# Patient Record
Sex: Female | Born: 1940
Health system: Southern US, Community
[De-identification: ages and names within clinical notes are randomized; demographics above are authoritative.]

## PROBLEM LIST (undated history)

## (undated) DIAGNOSIS — E039 Hypothyroidism, unspecified: Secondary | ICD-10-CM

## (undated) DIAGNOSIS — R061 Stridor: Secondary | ICD-10-CM

## (undated) DIAGNOSIS — S83519A Sprain of anterior cruciate ligament of unspecified knee, initial encounter: Secondary | ICD-10-CM

## (undated) DIAGNOSIS — Z87442 Personal history of urinary calculi: Secondary | ICD-10-CM

## (undated) DIAGNOSIS — M810 Age-related osteoporosis without current pathological fracture: Secondary | ICD-10-CM

## (undated) DIAGNOSIS — N938 Other specified abnormal uterine and vaginal bleeding: Secondary | ICD-10-CM

## (undated) DIAGNOSIS — E538 Deficiency of other specified B group vitamins: Secondary | ICD-10-CM

## (undated) DIAGNOSIS — I7121 Aneurysm of the ascending aorta, without rupture: Secondary | ICD-10-CM

## (undated) DIAGNOSIS — D369 Benign neoplasm, unspecified site: Secondary | ICD-10-CM

## (undated) DIAGNOSIS — M797 Fibromyalgia: Secondary | ICD-10-CM

## (undated) DIAGNOSIS — Z9289 Personal history of other medical treatment: Secondary | ICD-10-CM

## (undated) DIAGNOSIS — K219 Gastro-esophageal reflux disease without esophagitis: Secondary | ICD-10-CM

## (undated) DIAGNOSIS — F419 Anxiety disorder, unspecified: Secondary | ICD-10-CM

## (undated) DIAGNOSIS — M1990 Unspecified osteoarthritis, unspecified site: Secondary | ICD-10-CM

## (undated) DIAGNOSIS — F329 Major depressive disorder, single episode, unspecified: Secondary | ICD-10-CM

## (undated) DIAGNOSIS — I712 Thoracic aortic aneurysm, without rupture: Secondary | ICD-10-CM

## (undated) DIAGNOSIS — Z930 Tracheostomy status: Secondary | ICD-10-CM

## (undated) DIAGNOSIS — E041 Nontoxic single thyroid nodule: Secondary | ICD-10-CM

## (undated) DIAGNOSIS — J386 Stenosis of larynx: Secondary | ICD-10-CM

## (undated) DIAGNOSIS — Z8781 Personal history of (healed) traumatic fracture: Secondary | ICD-10-CM

## (undated) DIAGNOSIS — H409 Unspecified glaucoma: Secondary | ICD-10-CM

## (undated) DIAGNOSIS — J42 Unspecified chronic bronchitis: Secondary | ICD-10-CM

## (undated) DIAGNOSIS — J45909 Unspecified asthma, uncomplicated: Secondary | ICD-10-CM

## (undated) DIAGNOSIS — R251 Tremor, unspecified: Secondary | ICD-10-CM

## (undated) DIAGNOSIS — A048 Other specified bacterial intestinal infections: Secondary | ICD-10-CM

## (undated) DIAGNOSIS — K449 Diaphragmatic hernia without obstruction or gangrene: Secondary | ICD-10-CM

## (undated) DIAGNOSIS — M8580 Other specified disorders of bone density and structure, unspecified site: Secondary | ICD-10-CM

## (undated) DIAGNOSIS — F32A Depression, unspecified: Secondary | ICD-10-CM

## (undated) DIAGNOSIS — I10 Essential (primary) hypertension: Secondary | ICD-10-CM

## (undated) DIAGNOSIS — E785 Hyperlipidemia, unspecified: Secondary | ICD-10-CM

## (undated) DIAGNOSIS — C73 Malignant neoplasm of thyroid gland: Secondary | ICD-10-CM

## (undated) HISTORY — PX: JOINT REPLACEMENT: SHX530

## (undated) HISTORY — DX: Other specified bacterial intestinal infections: A04.8

## (undated) HISTORY — DX: Other specified disorders of bone density and structure, unspecified site: M85.80

## (undated) HISTORY — PX: EXCISIONAL HEMORRHOIDECTOMY: SHX1541

## (undated) HISTORY — PX: FRACTURE SURGERY: SHX138

## (undated) HISTORY — DX: Diaphragmatic hernia without obstruction or gangrene: K44.9

## (undated) HISTORY — DX: Sprain of anterior cruciate ligament of unspecified knee, initial encounter: S83.519A

## (undated) HISTORY — DX: Essential (primary) hypertension: I10

## (undated) HISTORY — DX: Unspecified osteoarthritis, unspecified site: M19.90

## (undated) HISTORY — PX: CARDIAC VALVE REPLACEMENT: SHX585

## (undated) HISTORY — PX: CATARACT EXTRACTION, BILATERAL: SHX1313

## (undated) HISTORY — DX: Benign neoplasm, unspecified site: D36.9

## (undated) HISTORY — DX: Age-related osteoporosis without current pathological fracture: M81.0

## (undated) HISTORY — PX: APPENDECTOMY: SHX54

## (undated) HISTORY — DX: Other specified abnormal uterine and vaginal bleeding: N93.8

## (undated) HISTORY — DX: Hyperlipidemia, unspecified: E78.5

## (undated) HISTORY — DX: Fibromyalgia: M79.7

## (undated) HISTORY — DX: Major depressive disorder, single episode, unspecified: F32.9

## (undated) HISTORY — PX: HERNIA REPAIR: SHX51

## (undated) HISTORY — DX: Depression, unspecified: F32.A

## (undated) HISTORY — DX: Deficiency of other specified B group vitamins: E53.8

## (undated) HISTORY — DX: Malignant neoplasm of thyroid gland: C73

## (undated) HISTORY — DX: Unspecified glaucoma: H40.9

## (undated) HISTORY — PX: EYE SURGERY: SHX253

---

## 1977-06-27 HISTORY — PX: CHOLECYSTECTOMY OPEN: SUR202

## 1995-03-28 DIAGNOSIS — N938 Other specified abnormal uterine and vaginal bleeding: Secondary | ICD-10-CM

## 1995-03-28 HISTORY — DX: Other specified abnormal uterine and vaginal bleeding: N93.8

## 1995-03-31 HISTORY — PX: DILATION AND CURETTAGE OF UTERUS: SHX78

## 1997-07-28 HISTORY — PX: VENTRAL HERNIA REPAIR: SHX424

## 1998-02-20 ENCOUNTER — Other Ambulatory Visit: Admission: RE | Admit: 1998-02-20 | Discharge: 1998-02-20 | Payer: Self-pay | Admitting: Obstetrics and Gynecology

## 1998-08-14 ENCOUNTER — Other Ambulatory Visit: Admission: RE | Admit: 1998-08-14 | Discharge: 1998-08-14 | Payer: Self-pay | Admitting: Obstetrics and Gynecology

## 1999-01-26 ENCOUNTER — Other Ambulatory Visit: Admission: RE | Admit: 1999-01-26 | Discharge: 1999-01-26 | Payer: Self-pay | Admitting: Obstetrics and Gynecology

## 1999-05-13 ENCOUNTER — Encounter: Payer: Self-pay | Admitting: Family Medicine

## 1999-05-13 ENCOUNTER — Ambulatory Visit (HOSPITAL_COMMUNITY): Admission: RE | Admit: 1999-05-13 | Discharge: 1999-05-13 | Payer: Self-pay | Admitting: Family Medicine

## 1999-08-26 HISTORY — PX: BUNIONECTOMY: SHX129

## 1999-10-14 ENCOUNTER — Encounter: Admission: RE | Admit: 1999-10-14 | Discharge: 1999-10-14 | Payer: Self-pay | Admitting: Obstetrics and Gynecology

## 1999-10-14 ENCOUNTER — Encounter: Payer: Self-pay | Admitting: Obstetrics and Gynecology

## 2000-01-28 ENCOUNTER — Other Ambulatory Visit: Admission: RE | Admit: 2000-01-28 | Discharge: 2000-01-28 | Payer: Self-pay | Admitting: Obstetrics and Gynecology

## 2000-08-09 ENCOUNTER — Encounter: Admission: RE | Admit: 2000-08-09 | Discharge: 2000-08-09 | Payer: Self-pay | Admitting: Gastroenterology

## 2000-08-09 ENCOUNTER — Encounter: Payer: Self-pay | Admitting: Gastroenterology

## 2000-10-25 ENCOUNTER — Encounter: Admission: RE | Admit: 2000-10-25 | Discharge: 2000-10-25 | Payer: Self-pay | Admitting: Family Medicine

## 2000-10-25 ENCOUNTER — Encounter: Payer: Self-pay | Admitting: Family Medicine

## 2001-02-05 ENCOUNTER — Other Ambulatory Visit: Admission: RE | Admit: 2001-02-05 | Discharge: 2001-02-05 | Payer: Self-pay | Admitting: Obstetrics and Gynecology

## 2001-10-29 ENCOUNTER — Encounter: Payer: Self-pay | Admitting: Family Medicine

## 2001-10-29 ENCOUNTER — Encounter: Admission: RE | Admit: 2001-10-29 | Discharge: 2001-10-29 | Payer: Self-pay | Admitting: Family Medicine

## 2002-03-12 ENCOUNTER — Ambulatory Visit (HOSPITAL_COMMUNITY): Admission: RE | Admit: 2002-03-12 | Discharge: 2002-03-12 | Payer: Self-pay | Admitting: Gastroenterology

## 2002-03-12 ENCOUNTER — Encounter (INDEPENDENT_AMBULATORY_CARE_PROVIDER_SITE_OTHER): Payer: Self-pay | Admitting: *Deleted

## 2002-10-29 ENCOUNTER — Encounter: Admission: RE | Admit: 2002-10-29 | Discharge: 2002-10-29 | Payer: Self-pay | Admitting: Gastroenterology

## 2002-10-29 ENCOUNTER — Encounter: Payer: Self-pay | Admitting: Gastroenterology

## 2002-10-31 ENCOUNTER — Encounter: Payer: Self-pay | Admitting: Obstetrics and Gynecology

## 2002-10-31 ENCOUNTER — Encounter: Admission: RE | Admit: 2002-10-31 | Discharge: 2002-10-31 | Payer: Self-pay | Admitting: Obstetrics and Gynecology

## 2003-11-03 ENCOUNTER — Encounter: Admission: RE | Admit: 2003-11-03 | Discharge: 2003-11-03 | Payer: Self-pay | Admitting: Family Medicine

## 2004-12-07 ENCOUNTER — Encounter: Admission: RE | Admit: 2004-12-07 | Discharge: 2004-12-07 | Payer: Self-pay | Admitting: Obstetrics and Gynecology

## 2005-12-09 ENCOUNTER — Encounter: Admission: RE | Admit: 2005-12-09 | Discharge: 2005-12-09 | Payer: Self-pay | Admitting: Family Medicine

## 2006-07-03 ENCOUNTER — Ambulatory Visit (HOSPITAL_COMMUNITY): Admission: RE | Admit: 2006-07-03 | Discharge: 2006-07-03 | Payer: Self-pay | Admitting: Ophthalmology

## 2006-12-12 ENCOUNTER — Encounter: Admission: RE | Admit: 2006-12-12 | Discharge: 2006-12-12 | Payer: Self-pay | Admitting: Obstetrics and Gynecology

## 2007-03-12 ENCOUNTER — Ambulatory Visit (HOSPITAL_COMMUNITY): Admission: RE | Admit: 2007-03-12 | Discharge: 2007-03-12 | Payer: Self-pay | Admitting: Ophthalmology

## 2007-05-28 ENCOUNTER — Ambulatory Visit (HOSPITAL_COMMUNITY): Admission: RE | Admit: 2007-05-28 | Discharge: 2007-05-28 | Payer: Self-pay | Admitting: Ophthalmology

## 2007-05-28 LAB — HM COLONOSCOPY

## 2007-10-15 ENCOUNTER — Encounter (INDEPENDENT_AMBULATORY_CARE_PROVIDER_SITE_OTHER): Payer: Self-pay | Admitting: Ophthalmology

## 2007-10-15 ENCOUNTER — Ambulatory Visit (HOSPITAL_COMMUNITY): Admission: RE | Admit: 2007-10-15 | Discharge: 2007-10-15 | Payer: Self-pay | Admitting: Ophthalmology

## 2007-12-14 ENCOUNTER — Encounter: Admission: RE | Admit: 2007-12-14 | Discharge: 2007-12-14 | Payer: Self-pay | Admitting: Family Medicine

## 2008-03-14 ENCOUNTER — Encounter (INDEPENDENT_AMBULATORY_CARE_PROVIDER_SITE_OTHER): Payer: Self-pay | Admitting: Gastroenterology

## 2008-03-14 ENCOUNTER — Ambulatory Visit (HOSPITAL_COMMUNITY): Admission: RE | Admit: 2008-03-14 | Discharge: 2008-03-14 | Payer: Self-pay | Admitting: Gastroenterology

## 2008-10-13 ENCOUNTER — Ambulatory Visit (HOSPITAL_COMMUNITY): Admission: RE | Admit: 2008-10-13 | Discharge: 2008-10-13 | Payer: Self-pay | Admitting: Ophthalmology

## 2008-12-16 ENCOUNTER — Encounter: Admission: RE | Admit: 2008-12-16 | Discharge: 2008-12-16 | Payer: Self-pay | Admitting: Family Medicine

## 2009-04-20 ENCOUNTER — Encounter: Admission: RE | Admit: 2009-04-20 | Discharge: 2009-06-25 | Payer: Self-pay | Admitting: Orthopedic Surgery

## 2009-06-17 ENCOUNTER — Encounter: Admission: RE | Admit: 2009-06-17 | Discharge: 2009-06-17 | Payer: Self-pay | Admitting: Family Medicine

## 2009-08-25 HISTORY — PX: TOTAL KNEE ARTHROPLASTY: SHX125

## 2009-09-23 ENCOUNTER — Inpatient Hospital Stay (HOSPITAL_COMMUNITY): Admission: RE | Admit: 2009-09-23 | Discharge: 2009-09-26 | Payer: Self-pay | Admitting: Orthopedic Surgery

## 2009-10-12 ENCOUNTER — Encounter: Admission: RE | Admit: 2009-10-12 | Discharge: 2010-01-11 | Payer: Self-pay | Admitting: Orthopedic Surgery

## 2010-01-12 ENCOUNTER — Encounter: Admission: RE | Admit: 2010-01-12 | Discharge: 2010-03-25 | Payer: Self-pay | Admitting: Orthopedic Surgery

## 2010-01-13 ENCOUNTER — Encounter: Payer: Self-pay | Admitting: Cardiology

## 2010-01-27 ENCOUNTER — Encounter: Admission: RE | Admit: 2010-01-27 | Discharge: 2010-01-27 | Payer: Self-pay | Admitting: Obstetrics and Gynecology

## 2010-02-23 DIAGNOSIS — K219 Gastro-esophageal reflux disease without esophagitis: Secondary | ICD-10-CM | POA: Insufficient documentation

## 2010-02-23 DIAGNOSIS — I059 Rheumatic mitral valve disease, unspecified: Secondary | ICD-10-CM | POA: Insufficient documentation

## 2010-02-23 DIAGNOSIS — I10 Essential (primary) hypertension: Secondary | ICD-10-CM | POA: Insufficient documentation

## 2010-02-23 DIAGNOSIS — M1990 Unspecified osteoarthritis, unspecified site: Secondary | ICD-10-CM | POA: Insufficient documentation

## 2010-02-23 DIAGNOSIS — I00 Rheumatic fever without heart involvement: Secondary | ICD-10-CM | POA: Insufficient documentation

## 2010-02-23 DIAGNOSIS — E785 Hyperlipidemia, unspecified: Secondary | ICD-10-CM | POA: Insufficient documentation

## 2010-02-23 DIAGNOSIS — H409 Unspecified glaucoma: Secondary | ICD-10-CM | POA: Insufficient documentation

## 2010-02-23 DIAGNOSIS — F339 Major depressive disorder, recurrent, unspecified: Secondary | ICD-10-CM | POA: Insufficient documentation

## 2010-02-23 DIAGNOSIS — H269 Unspecified cataract: Secondary | ICD-10-CM | POA: Insufficient documentation

## 2010-02-23 DIAGNOSIS — M797 Fibromyalgia: Secondary | ICD-10-CM | POA: Insufficient documentation

## 2010-02-23 DIAGNOSIS — F329 Major depressive disorder, single episode, unspecified: Secondary | ICD-10-CM

## 2010-02-26 ENCOUNTER — Ambulatory Visit: Payer: Self-pay | Admitting: Cardiology

## 2010-02-26 DIAGNOSIS — R072 Precordial pain: Secondary | ICD-10-CM | POA: Insufficient documentation

## 2010-03-09 ENCOUNTER — Telehealth (INDEPENDENT_AMBULATORY_CARE_PROVIDER_SITE_OTHER): Payer: Self-pay | Admitting: *Deleted

## 2010-03-10 ENCOUNTER — Encounter: Payer: Self-pay | Admitting: Cardiology

## 2010-03-10 ENCOUNTER — Ambulatory Visit: Payer: Self-pay

## 2010-03-10 ENCOUNTER — Ambulatory Visit: Payer: Self-pay | Admitting: Cardiology

## 2010-03-10 ENCOUNTER — Encounter (HOSPITAL_COMMUNITY)
Admission: RE | Admit: 2010-03-10 | Discharge: 2010-04-28 | Payer: Self-pay | Source: Home / Self Care | Admitting: Cardiology

## 2010-07-18 ENCOUNTER — Encounter: Payer: Self-pay | Admitting: Family Medicine

## 2010-07-28 LAB — FECAL OCCULT BLOOD, GUAIAC: Fecal Occult Blood: NEGATIVE

## 2010-07-29 NOTE — Progress Notes (Signed)
Summary: nuc pre procedure  Phone Note Outgoing Call Call back at Home Phone [phone]   Call placed by: Cathlyn Parsons RN,  March 09, 2010 4:17 PM Call placed to: Patient Reason for Call: Confirm/change Appt Summary of Call: Left message with information on Myoview Information Sheet (see scanned document for details).      Nuclear Med Background Indications for Stress Test: Evaluation for Ischemia   History: MVP   Symptoms: Chest Pain, Chest Tightness, Fatigue    Nuclear Pre-Procedure Cardiac Risk Factors: Family History - CAD, Hypertension, Lipids, RBBB Height (in): 65

## 2010-07-29 NOTE — Assessment & Plan Note (Signed)
Summary: np6/ chest discomfort. pt has bcbs./ gd   Visit Type:  Initial Consult Primary Provider:  Dr. Vernon Prey  CC:  chest pain.  History of Present Illness: The patient presents for evaluation of chest discomfort and arm discomfort. She says this has been going on for a couple of months. It happens when she is stressed or when she is in her garden. She says her arms feel tired. She may have some midsternal tightness. She said she stops what she is doing and she takes a deep breath and her symptoms will go away. She's had more fatigue than she usually does. She's never had symptoms like this before. She denies any resting shortness of breath, PND or orthopnea. She denies any palpitations, presyncope or syncope. She's had no prior cardiovascular testing. There is a vague history of rheumatic heart disease as a child with mitral valve prolapse diagnosed as an adult but she doesn't remember ever having an echocardiogram. She does have a chronic right bundle branch block.  Current Medications (verified): 1)  Simvastatin 80 Mg Tabs (Simvastatin) .Marland Kitchen.. 1 By Mouth Daily 2)  Celebrex 200 Mg Caps (Celecoxib) .Marland Kitchen.. 1 By Mouth Daily 3)  Atenolol 50 Mg Tabs (Atenolol) .Marland Kitchen.. 1 By Mouth Daily 4)  Venlafaxine Hcl 75 Mg Tabs (Venlafaxine Hcl) .Marland Kitchen.. 1 By Mouth Daily 5)  Ambien 10 Mg Tabs (Zolpidem Tartrate) .Marland Kitchen.. 1 By Mouth At Bedtime 6)  Tramadol Hcl 50 Mg Tabs (Tramadol Hcl) .... As Needed 7)  Clorazepate Dipotassium 7.5 Mg Tabs (Clorazepate Dipotassium) .Marland Kitchen.. 1 By Mouth As Needed 8)  Gabapentin 300 Mg Caps (Gabapentin) .Marland Kitchen.. 1 By Mouth Daily 9)  Aspirin 325 Mg  Tabs (Aspirin) .Marland Kitchen.. 1 By Mouth Daily 10)  Vitamin D3 2000 Unit Tabs (Cholecalciferol) .Marland Kitchen.. 1 By Mouth Daily 11)  Prilosec 20 Mg Cpdr (Omeprazole) .Marland Kitchen.. 1 Po Daily 12)  Multivitamins   Tabs (Multiple Vitamin) .Marland Kitchen.. 1 By Mouth Daily 13)  Fish Oil   Oil (Fish Oil) .Marland Kitchen.. 1 By Mouth Daily  Allergies (verified): 1)  ! * Mobic  Past History:  Past Medical  History:  1. Osteoarthritis of the right knee.   2. Depression.   3. Glaucoma.   4. Cataracts.   5. Dentures.   6. Hypertension. x years  7. Rheumatic fever.   8. Mitral valve prolapse.   9. Hyperlipidemia. x years  10.Hiatal hernia.   11.Gastroesophageal reflux disease.   12.Hemorrhoids.   13.Cholelithiasis.   14.Osteoporosis.   15.Fibromyalgia.   16.Osteoarthritis.   17.History of scarlet fever as a child.   18.History of measles as a child.   19.History of mumps as a child.   Past Surgical History: Reviewed history from 02/23/2010 and no changes required.  1. Cholecystectomy.   2. Hernia repair.   3. Hammertoe repair of the foot.   4. Hemorrhoidectomy.      Family History: Father died at the age of 46 of heart attack.  Mother died at age 43 of pancreatic cancer.      Social History: The patient is married.  She is a housewife.  The   patient denies any past or present use of tobacco products or alcohol.   The patient states that she does plan to go home after her hospital   stay.  Her husband is lined up to be her caregiver and they would like   to  Home Health to come out to do her physical therapy after   surgery.   Review of Systems  Positive for dysphasia, occasional headaches, tinnitus, constipation, reflux, fibromyalgia. Otherwise as stated in the history of present illness negative for all other systems.  Vital Signs:  Patient profile:   70 year old female Height:      65 inches Weight:      196 pounds BMI:     32.73 Pulse rate:   64 / minute Resp:     16 per minute BP sitting:   117 / 76  (right arm)  Vitals Entered By: Marrion Coy, CNA (February 26, 2010 3:57 PM)  Physical Exam  General:  Well developed, well nourished, in no acute distress. Head:  normocephalic and atraumatic Eyes:  PERRLA/EOM intact; conjunctiva and lids normal. Neck:  Neck supple, no JVD. No masses, thyromegaly or abnormal cervical nodes. Chest Wall:  no  deformities or breast masses noted Lungs:  Clear bilaterally to auscultation and percussion. Abdomen:  Bowel sounds positive; abdomen soft and non-tender without masses, organomegaly, or hernias noted. No hepatosplenomegaly. Msk:  Back normal, normal gait. Muscle strength and tone normal. Extremities:  No clubbing or cyanosis. Neurologic:  Alert and oriented x 3. Skin:  Intact without lesions or rashes. Cervical Nodes:  no significant adenopathy Inguinal Nodes:  no significant adenopathy Psych:  Normal affect.   Detailed Cardiovascular Exam  Neck    Carotids: Carotids full and equal bilaterally without bruits.      Neck Veins: Normal, no JVD.    Heart    Inspection: no deformities or lifts noted.      Palpation: normal PMI with no thrills palpable.      Auscultation: regular rate and rhythm, S1, S2 without murmurs, rubs, gallops, or clicks.    Vascular    Abdominal Aorta: no palpable masses, pulsations, or audible bruits.      Femoral Pulses: normal femoral pulses bilaterally.      Pedal Pulses: normal pedal pulses bilaterally.      Radial Pulses: normal radial pulses bilaterally.      Peripheral Circulation: no clubbing, cyanosis, or edema noted with normal capillary refill.     EKG  Procedure date:  01/13/2010  Findings:      Sinus rhythm, rate 57, axis within normal limits, right bundle branch block  Impression & Recommendations:  Problem # 1:  CHEST PAIN, PRECORDIAL (ICD-786.51) The patient's chest pain has some typical and some atypical features. She does have significant cardiovascular risk factors. I think the pretest probability of obstructive coronary disease is at least moderately high. Given this stress perfusion imaging is indicated. She would likely be able to walk on a treadmill but if not she could be converted to pharmacologic perfusion imaging. Orders: Nuclear Stress Test (Nuc Stress Test)  Problem # 2:  HYPERTENSION (ICD-401.9) Her blood pressure is  controlled. She will continue the meds as listed.  Problem # 3:  HYPERLIPIDEMIA (ICD-272.4) She has been on simvastatin 80 mg for years. She's had no symptoms related to this although there is a small possibility that her arm discomfort could be related. According the FDA guidelines she does not need to stop the 80 mg.  Problem # 4:  MITRAL VALVE PROLAPSE (ICD-424.0) I do not appreciate a mitral valve prolapse click. At this point I would not suggest further cardiovascular imaging but will consider this in the future.  Patient Instructions: 1)  Your physician recommends that you schedule a follow-up appointment as needed 2)  Your physician recommends that you continue on your current medications as directed. Please refer to  the Current Medication list given to you today. 3)  Your physician has requested that you have an exercise stress myoview.  For further information please visit [email or web address]  Please follow instruction sheet, as given.

## 2010-07-29 NOTE — Assessment & Plan Note (Signed)
Summary: Cardiology Nuclear Testing  Nuclear Med Background Indications for Stress Test: Evaluation for Ischemia   History: MVP   Symptoms: Chest Pain, Chest Tightness, Fatigue    Nuclear Pre-Procedure Cardiac Risk Factors: Family History - CAD, Hypertension, Lipids, RBBB Caffeine/Decaff Intake: none NPO After: 7:00 AM Lungs: clear IV 0.9% NS with Angio Cath: 22g     IV Site: R Antecubital IV Started by: Bonnita Levan, RN Chest Size (in) 40     Cup Size D     Height (in): 65 Weight (lb): 194 BMI: 32.40  Nuclear Med Study 1 or 2 day study:  1 day     Stress Test Type:  Stress Reading MD:  Willa Rough, MD     Referring MD:  Shela Commons.Hochrein Resting Radionuclide:  Technetium 58m Tetrofosmin     Resting Radionuclide Dose:  10 mCi  Stress Radionuclide:  Technetium 14m Tetrofosmin     Stress Radionuclide Dose:  33 mCi   Stress Protocol Exercise Time (min):  5:15 min     Max HR:  153 bpm     Predicted Max HR:  153 bpm  Max Systolic BP: 205 mm Hg     Percent Max HR:  100 %     METS: 6.80 Rate Pressure Product:  16109    Stress Test Technologist:  Milana Na, EMT-P     Nuclear Technologist:  Domenic Polite, CNMT  Rest Procedure  Myocardial perfusion imaging was performed at rest 45 minutes following the intravenous administration of Technetium 76m Tetrofosmin.  Stress Procedure  The patient exercised for 5:00. The patient stopped due to fatigue and denied any chest pain.  There were no significant ST-T wave changes.  Technetium 35m Tetrofosmin was injected at peak exercise and myocardial perfusion imaging was performed after a brief delay.  QPS Raw Data Images:  Patient motion noted; appropriate software correction applied. Stress Images:  Anterior breast attenuation Rest Images:  Same as stress Subtraction (SDS):  No evidence of ischemia. Transient Ischemic Dilatation:  1.18  (Normal <1.22)  Lung/Heart Ratio:  .28  (Normal <0.45)  Quantitative Gated Spect Images QGS EDV:   97 ml QGS ESV:  27 ml QGS EF:  72 % QGS cine images:  normal motion  Findings Normal nuclear study      Overall Impression  Exercise Capacity: Fair exercise capacity. BP Response: Normal blood pressure response. Clinical Symptoms: fatigue ECG Impression: No significant ST segment change suggestive of ischemia. Overall Impression: Normal stress nuclear study.  Appended Document: Cardiology Nuclear Testing No evidence of ischemia or infarct.  Appended Document: Cardiology Nuclear Testing pt aware of results

## 2010-09-15 LAB — HEMOGLOBIN AND HEMATOCRIT, BLOOD
HCT: 25.2 % — ABNORMAL LOW (ref 36.0–46.0)
Hemoglobin: 8.7 g/dL — ABNORMAL LOW (ref 12.0–15.0)
Hemoglobin: 9.7 g/dL — ABNORMAL LOW (ref 12.0–15.0)

## 2010-09-20 LAB — PROTIME-INR
INR: 1.03 (ref 0.00–1.49)
Prothrombin Time: 13.4 seconds (ref 11.6–15.2)

## 2010-09-20 LAB — URINALYSIS, ROUTINE W REFLEX MICROSCOPIC
Bilirubin Urine: NEGATIVE
Hgb urine dipstick: NEGATIVE
Nitrite: NEGATIVE
Protein, ur: NEGATIVE mg/dL
Specific Gravity, Urine: 1.004 — ABNORMAL LOW (ref 1.005–1.030)

## 2010-09-20 LAB — CBC
HCT: 38.3 % (ref 36.0–46.0)
MCV: 89.8 fL (ref 78.0–100.0)
Platelets: 246 10*3/uL (ref 150–400)
RDW: 14 % (ref 11.5–15.5)

## 2010-09-20 LAB — COMPREHENSIVE METABOLIC PANEL
ALT: 12 U/L (ref 0–35)
BUN: 9 mg/dL (ref 6–23)
CO2: 32 mEq/L (ref 19–32)
Chloride: 106 mEq/L (ref 96–112)
Creatinine, Ser: 0.63 mg/dL (ref 0.4–1.2)
GFR calc Af Amer: >60 mL/min (ref >60)
Potassium: 3.9 mEq/L (ref 3.5–5.1)
Sodium: 143 mEq/L (ref 135–145)

## 2010-09-20 LAB — HEMOGLOBIN AND HEMATOCRIT, BLOOD: HCT: 29.9 % — ABNORMAL LOW (ref 36.0–46.0)

## 2010-09-20 LAB — DIFFERENTIAL
Basophils Absolute: 0 10*3/uL (ref 0.0–0.1)
Eosinophils Absolute: 0.1 10*3/uL (ref 0.0–0.7)
Eosinophils Relative: 3 % (ref 0–5)
Lymphocytes Relative: 31 % (ref 12–46)
Lymphs Abs: 1.6 10*3/uL (ref 0.7–4.0)
Monocytes Absolute: 0.5 10*3/uL (ref 0.1–1.0)

## 2010-09-20 LAB — ABO/RH: ABO/RH(D): A POS

## 2010-10-06 LAB — BASIC METABOLIC PANEL
CO2: 32 mEq/L (ref 19–32)
Chloride: 105 mEq/L (ref 96–112)
Creatinine, Ser: 0.64 mg/dL (ref 0.4–1.2)
GFR calc Af Amer: >60 mL/min (ref >60)

## 2010-10-06 LAB — HEMOGLOBIN AND HEMATOCRIT, BLOOD: HCT: 33.1 % — ABNORMAL LOW (ref 36.0–46.0)

## 2010-11-01 ENCOUNTER — Encounter: Payer: Self-pay | Admitting: Family Medicine

## 2010-11-09 NOTE — Op Note (Signed)
[name], [name]                ACCOUNT NO.:  [id number]   MEDICAL RECORD NO.:  [id number]          PATIENT TYPE:  AMB   LOCATION:  ENDO                         FACILITY:  [facility]   PHYSICIAN:  Petra Kuba, M.D.    DATE OF BIRTH:  04-25-1941   DATE OF PROCEDURE:  03/14/2008  DATE OF DISCHARGE:                               OPERATIVE REPORT   PROCEDURE:  EGD with biopsy and dilatation.   ENDOSCOPIST:  Petra Kuba, MD.   INDICATION:  A patient with guaiac positivity, some upper tract  symptoms, and dysphagia.  Consent was signed after risks, benefits,  methods, options thoroughly discussed multiple times in the past.   MEDICINES USED:  1. Fentanyl 75 mcg.  2. Versed 10.5 mg.   PROCEDURE:  The video endoscope was inserted by direct vision.  The  proximal and midesophagus was normal.  In the distal esophagus was a  small hiatal hernia with a widely patent, thin, fibrous ring.  The scope  passed easily through this into the stomach and advanced to the antrum  where a few antral erosions and 2 small ulcers were seen.  The scope was  inserted through a normal pylorus into a normal duodenal bulb and around  the celiac to a normal second and probably third part of the duodenum.  The scope was slowly withdrawn back to the bulb.  Again, a good look  there ruled out abnormalities in that location.  The scope was withdrawn  into the stomach and retroflexed high in the cardia, and the hiatal  hernia was confirmed in the proximal stomach on both retroflexion and  straight visualization.  A small smooth nodule was seen that seemed  similar to the one seen previously.  A few cold biopsies of it were  obtained.  Otherwise, other than some mild gastritis, no other  abnormalities were seen in the stomach in straight and retroflexed  visualization.  We also went ahead and took a few biopsies of the antrum  as well as the ulcers and put them all in the same container to rule out  Helicobacter  or any other abnormality.  We then slowly advanced through  the esophagus back to 20 cm again confirming the normal esophagus except  for the small hiatal hernia and the ring mentioned above.  The scope was  readvanced to the antrum and under fluoro guidance the [facility] wire was  advanced.  The customary J-loop of the wire was confirmed both under  fluoro and endoscopically.  The scope was removed making sure to keep  the wire in the proper location, and we went ahead and inserted the 16-  mm dilator over the wire, it was confirmed in the stomach, it was passed  with no resistance, the wire was withdrawn back into the dilator, and  both were removed in tandem.  There was no heme on the dilator.  The  patient tolerated the procedure well, and there was no obvious immediate  complication.   ENDOSCOPIC DIAGNOSES:  1. Small hiatal hernia with a widely patent ring.  2. Small proximal  nodule on the lesser curve status post biopsy      essentially unchanged from previous endoscopy.  3. A few antral erosions and 2 small ulcers status post biopsy.  4. Otherwise normal esophagogastroduodenoscopy to the second or the      third part of the duodenum.   THERAPY:  Savory dilatation under fluoro to 16 mm only with operative  assistance or heme.   PLAN:  Await pathology, continue pump inhibitors long term particularly  as long as she is on aspirin or arthritis pills, and I will see her back  in 6 weeks to recheck guaiacs, CBC, and symptoms, and make sure no  further workup plans are needed, have her call me sooner p.r.n.           ______________________________  Petra Kuba, M.D.     MEM/MEDQ  D:  03/14/2008  T:  03/14/2008  Job:  [id number]   cc:   Malachi Pro. Ambrose Mantle, M.D.  Fax: [phone]   Ernestina Penna, M.D.  Fax: [phone]

## 2011-03-22 LAB — HEMOGLOBIN AND HEMATOCRIT, BLOOD
HCT: 34.8 — ABNORMAL LOW
Hemoglobin: 12.1

## 2011-03-22 LAB — BASIC METABOLIC PANEL
CO2: 33 — ABNORMAL HIGH
Calcium: 9.2
GFR calc Af Amer: >60
GFR calc non Af Amer: >60
Sodium: 141

## 2011-03-29 ENCOUNTER — Other Ambulatory Visit: Payer: Self-pay | Admitting: Family Medicine

## 2011-03-29 DIAGNOSIS — Z1231 Encounter for screening mammogram for malignant neoplasm of breast: Secondary | ICD-10-CM

## 2011-04-05 LAB — BASIC METABOLIC PANEL
Calcium: 9.3
Creatinine, Ser: 0.61
GFR calc Af Amer: >60

## 2011-04-05 LAB — HEMOGLOBIN AND HEMATOCRIT, BLOOD
HCT: 38.8
Hemoglobin: 13.2

## 2011-04-12 ENCOUNTER — Ambulatory Visit
Admission: RE | Admit: 2011-04-12 | Discharge: 2011-04-12 | Disposition: A | Discharge status: Home / Self Care | Payer: MEDICARE | Source: Ambulatory Visit | Attending: Family Medicine | Admitting: Family Medicine

## 2011-04-12 DIAGNOSIS — Z1231 Encounter for screening mammogram for malignant neoplasm of breast: Secondary | ICD-10-CM

## 2011-05-18 ENCOUNTER — Emergency Department (HOSPITAL_COMMUNITY): Payer: Medicare Other

## 2011-05-18 ENCOUNTER — Emergency Department (HOSPITAL_COMMUNITY)
Admission: EM | Admit: 2011-05-18 | Discharge: 2011-05-19 | Disposition: A | Discharge status: Home / Self Care | Payer: Medicare Other | Attending: Emergency Medicine | Admitting: Emergency Medicine

## 2011-05-18 ENCOUNTER — Encounter (HOSPITAL_COMMUNITY): Payer: Self-pay | Admitting: Emergency Medicine

## 2011-05-18 DIAGNOSIS — IMO0001 Reserved for inherently not codable concepts without codable children: Secondary | ICD-10-CM | POA: Insufficient documentation

## 2011-05-18 DIAGNOSIS — W11XXXA Fall on and from ladder, initial encounter: Secondary | ICD-10-CM | POA: Insufficient documentation

## 2011-05-18 DIAGNOSIS — M1990 Unspecified osteoarthritis, unspecified site: Secondary | ICD-10-CM | POA: Insufficient documentation

## 2011-05-18 DIAGNOSIS — S0990XA Unspecified injury of head, initial encounter: Secondary | ICD-10-CM | POA: Insufficient documentation

## 2011-05-18 DIAGNOSIS — M25476 Effusion, unspecified foot: Secondary | ICD-10-CM | POA: Insufficient documentation

## 2011-05-18 DIAGNOSIS — M79609 Pain in unspecified limb: Secondary | ICD-10-CM | POA: Insufficient documentation

## 2011-05-18 DIAGNOSIS — E785 Hyperlipidemia, unspecified: Secondary | ICD-10-CM | POA: Insufficient documentation

## 2011-05-18 DIAGNOSIS — M25473 Effusion, unspecified ankle: Secondary | ICD-10-CM | POA: Insufficient documentation

## 2011-05-18 DIAGNOSIS — I10 Essential (primary) hypertension: Secondary | ICD-10-CM | POA: Insufficient documentation

## 2011-05-18 DIAGNOSIS — S42302A Unspecified fracture of shaft of humerus, left arm, initial encounter for closed fracture: Secondary | ICD-10-CM

## 2011-05-18 DIAGNOSIS — Z79899 Other long term (current) drug therapy: Secondary | ICD-10-CM | POA: Insufficient documentation

## 2011-05-18 DIAGNOSIS — F3289 Other specified depressive episodes: Secondary | ICD-10-CM | POA: Insufficient documentation

## 2011-05-18 DIAGNOSIS — IMO0002 Reserved for concepts with insufficient information to code with codable children: Secondary | ICD-10-CM | POA: Insufficient documentation

## 2011-05-18 DIAGNOSIS — F329 Major depressive disorder, single episode, unspecified: Secondary | ICD-10-CM | POA: Insufficient documentation

## 2011-05-18 DIAGNOSIS — Z7982 Long term (current) use of aspirin: Secondary | ICD-10-CM | POA: Insufficient documentation

## 2011-05-18 DIAGNOSIS — S81009A Unspecified open wound, unspecified knee, initial encounter: Secondary | ICD-10-CM | POA: Insufficient documentation

## 2011-05-18 DIAGNOSIS — S42213A Unspecified displaced fracture of surgical neck of unspecified humerus, initial encounter for closed fracture: Secondary | ICD-10-CM | POA: Insufficient documentation

## 2011-05-18 DIAGNOSIS — S81809A Unspecified open wound, unspecified lower leg, initial encounter: Secondary | ICD-10-CM | POA: Insufficient documentation

## 2011-05-18 DIAGNOSIS — M25579 Pain in unspecified ankle and joints of unspecified foot: Secondary | ICD-10-CM | POA: Insufficient documentation

## 2011-05-18 DIAGNOSIS — R404 Transient alteration of awareness: Secondary | ICD-10-CM | POA: Insufficient documentation

## 2011-05-18 DIAGNOSIS — E538 Deficiency of other specified B group vitamins: Secondary | ICD-10-CM | POA: Insufficient documentation

## 2011-05-18 DIAGNOSIS — M25519 Pain in unspecified shoulder: Secondary | ICD-10-CM | POA: Insufficient documentation

## 2011-05-18 DIAGNOSIS — S42293A Other displaced fracture of upper end of unspecified humerus, initial encounter for closed fracture: Secondary | ICD-10-CM | POA: Insufficient documentation

## 2011-05-18 LAB — BASIC METABOLIC PANEL
Chloride: 104 mEq/L (ref 96–112)
Creatinine, Ser: 0.76 mg/dL (ref 0.50–1.10)
GFR calc Af Amer: >90 mL/min (ref >90)
GFR calc non Af Amer: 85 mL/min — ABNORMAL LOW (ref >90)
Potassium: 3.1 mEq/L — ABNORMAL LOW (ref 3.5–5.1)

## 2011-05-18 LAB — CBC
MCH: 28.1 pg (ref 26.0–34.0)
MCHC: 32.4 g/dL (ref 30.0–36.0)
RDW: 14.4 % (ref 11.5–15.5)

## 2011-05-18 LAB — DIFFERENTIAL
Basophils Absolute: 0 10*3/uL (ref 0.0–0.1)
Basophils Relative: 0 % (ref 0–1)
Eosinophils Absolute: 0.1 10*3/uL (ref 0.0–0.7)
Monocytes Absolute: 0.6 10*3/uL (ref 0.1–1.0)
Neutro Abs: 7.3 10*3/uL (ref 1.7–7.7)
Neutrophils Relative %: 76 % (ref 43–77)

## 2011-05-18 MED ORDER — MORPHINE SULFATE 4 MG/ML IJ SOLN
4.0000 mg | Freq: Once | INTRAMUSCULAR | Status: AC
  Administered 2011-05-18: 4 mg via INTRAVENOUS
  Filled 2011-05-18: qty 1

## 2011-05-18 MED ORDER — SODIUM CHLORIDE 0.9 % IV SOLN
1000.0000 mL | INTRAVENOUS | Status: AC
  Administered 2011-05-18: 1000 mL via INTRAVENOUS

## 2011-05-18 MED ORDER — FENTANYL CITRATE 0.05 MG/ML IJ SOLN
25.0000 ug | Freq: Once | INTRAMUSCULAR | Status: AC
  Administered 2011-05-18: 25 ug via INTRAVENOUS
  Filled 2011-05-18: qty 2

## 2011-05-18 NOTE — ED Notes (Signed)
Pt states that she was on a ladder changing a lightbulb and fell off hitting shoulder knee and ankle.  Pt denies loc.

## 2011-05-18 NOTE — ED Provider Notes (Signed)
History     CSN: [id number] Arrival date & time: 05/18/2011  5:05 PM   First MD Initiated Contact with Patient 05/18/11 1705      Chief Complaint  Patient presents with  . Fall    (Consider location/radiation/quality/duration/timing/severity/associated sxs/prior treatment) HPI The patient is a 70 year old female who presents today by ambulance after having a mechanical fall. The patient was changing a light bulb when she fell to the floor. She lost consciousness briefly. Patient complains of left shoulder pain. She also complains of left ankle pain. There is a small skin tear noted along the anterior surface of the left tibia. There is also an abrasion on the left elbow. Patient denies any numbness, tingling, or paresthesias. She is on aspirin. She's not on any other anticoagulants. The patient was feeling well prior to the fall. She did not have any chest pain, shortness breath, or other concerning symptoms prior to the fall. Patient states her pain is an 8/10 at this time. Made worse by movement of her left upper extremity and better by staying still. Past Medical History  Diagnosis Date  . Helicobacter pylori (H. pylori)   . Hiatal hernia   . Other and unspecified hyperlipidemia   . Fibromyalgia   . Osteoarthritis   . Hyperlipidemia   . Hypertension   . ACL tear     right  Dr. Lowella Curb   . Glaucoma     (SE) Dr. Lovell Sheehan   . Arthritis   . Post-menopausal   . Depression   . Fibromyalgia   . B12 deficiency   . Tremor   . DUB (dysfunctional uterine bleeding) 10/96  . Insomnia   . Constipation   . ETD (eustachian tube dysfunction)   . MVP (mitral valve prolapse)   . Adenomatous polyp   . Osteopenia   . Cataracts, both eyes 10/2006    Past Surgical History  Procedure Date  . Right knee replacement 3/11    Dr. Lowella Curb  . Rt. eye cataract 05/28/07  . Dilation and curettage of uterus 03/31/95    Dr. Adron Bene   . Cholecystectomy 1979  . Ventral hernia repair 2/99  .  Bunionectomy 08/1999    right - Dr. Ulice Brilliant     History reviewed. No pertinent family history.  History  Substance Use Topics  . Smoking status: Not on file  . Smokeless tobacco: Not on file  . Alcohol Use: Not on file    OB History    Grav Para Term Preterm Abortions TAB SAB Ect Mult Living                  Review of Systems  Constitutional: Negative.   HENT: Negative.   Eyes: Negative.   Respiratory: Negative.   Cardiovascular: Negative.   Gastrointestinal: Negative.   Genitourinary: Negative.   Musculoskeletal:       See history of present illness  Skin: Positive for wound.  Neurological:       Loss of consciousness  Hematological: Negative.   Psychiatric/Behavioral: Negative.   All other systems reviewed and are negative.    Allergies  Fosamax; Latex; Levaquin; Lipitor; Lyrica; Meloxicam; Meridia; Mobic; and Motrin  Home Medications   Current Outpatient Rx  Name Route Sig Dispense Refill  . ASPIRIN 81 MG PO TBEC Oral Take 81 mg by mouth daily.      . ATENOLOL 50 MG PO TABS Oral Take 50 mg by mouth daily.      Marland Kitchen BISACODYL 5 MG PO TBEC  Oral Take 10 mg by mouth daily as needed. For constipation     . CELECOXIB 200 MG PO CAPS Oral Take 200 mg by mouth daily.     Marland Kitchen VITAMIN D3 1000 UNITS PO CAPS Oral Take 1 capsule by mouth daily.      Marland Kitchen CLORAZEPATE DIPOTASSIUM 7.5 MG PO TABS Oral Take 7.5 mg by mouth daily as needed. For anxiety    . FLUTICASONE PROPIONATE 50 MCG/ACT NA SUSP Nasal Place 1 spray into the nose daily as needed. For congestion     . HYDROCORTISONE 2.5 % RE CREA Rectal Place 1 application rectally 2 (two) times daily as needed. For constipation    . SENIOR MULTIVITAMIN PLUS PO Oral Take by mouth daily.      Marland Kitchen PRILOSEC OTC PO Oral Take 1 capsule by mouth daily. For reflux    . SIMVASTATIN 80 MG PO TABS Oral Take 40 mg by mouth at bedtime.      . TRAVOPROST (BAK FREE) 0.004 % OP SOLN Right Eye Place 1 drop into the right eye at bedtime.      .  VENLAFAXINE HCL 75 MG PO CP24 Oral Take 75 mg by mouth daily.      Marland Kitchen NEURO B-12 IJ Injection Inject 1 vial as directed every 30 (thirty) days.     Marland Kitchen ZOLPIDEM TARTRATE 10 MG PO TABS Oral Take 10 mg by mouth at bedtime as needed. For sleep    . TRAMADOL HCL 50 MG PO TABS Oral Take 50 mg by mouth daily as needed. For pain Maximum dose= 8 tablets per day       BP 171/79  Pulse 64  Temp(Src) 97.6 F (36.4 C) (Oral)  Resp 20  SpO2 100%  Physical Exam  Nursing note and vitals reviewed. Constitutional: She is oriented to person, place, and time. She appears well-developed and well-nourished.       Uncomfortable  HENT:  Head: Normocephalic and atraumatic.  Eyes: Conjunctivae and EOM are normal. Pupils are equal, round, and reactive to light.  Neck: No tracheal deviation present.       Patient arrived in C-spine collar. She had no tenderness to palpation in the midline. Patient was clinically cleared and had full range of motion without difficulties.  Cardiovascular: Normal rate, regular rhythm, normal heart sounds and intact distal pulses.  Exam reveals no gallop and no friction rub.   No murmur heard. Pulmonary/Chest: Effort normal and breath sounds normal. No respiratory distress. She has no wheezes. She has no rales.  Abdominal: Soft. Bowel sounds are normal. She exhibits no distension. There is no tenderness. There is no rebound and no guarding.  Musculoskeletal: She exhibits tenderness.       Patient with swelling and tenderness to palpation over the left shoulder. She is neurovascularly intact distal to this. There no overlying lacerations to suggest an open injury. Patient has tenderness to palpation over the anterior portion of the left lower leg. She also complains of pain along the left femur though there is no deformity.  Patient did arrive restrained on backboard and had no tenderness to palpation over the thoracic or lumbar spine or the sacrum.  Neurological: She is alert and  oriented to person, place, and time. No cranial nerve deficit. She exhibits normal muscle tone. Coordination normal.  Skin: Skin is warm and dry. No rash noted.       Patient has a small skin tear over her left elbow as well as over the left  anterior tibia. Both of these are very superficial.  Psychiatric: She has a normal mood and affect.    ED Course  Procedures (including critical care time)  Labs Reviewed  BASIC METABOLIC PANEL - Abnormal; Notable for the following:    Potassium 3.1 (*)    Glucose, Bld 121 (*)    GFR calc non Af Amer 85 (*)    All other components within normal limits  CBC  DIFFERENTIAL   Dg Femur Left  05/18/2011  *RADIOLOGY REPORT*  Clinical Data: Status post fall.  LEFT FEMUR - 2 VIEW  Comparison: None.  Findings: No acute bony or joint abnormality is identified. Degenerative change about the left knee is seen.  The patient also has degenerative disease of the symphysis pubis.  IMPRESSION: No acute finding.  Original Report Authenticated By: Bernadene Bell. D'ALESSIO, M.D.   Dg Tibia/fibula Left  05/18/2011  *RADIOLOGY REPORT*  Clinical Data: Fall, pain.  LEFT TIBIA AND FIBULA - 2 VIEW  Comparison: None.  Findings: No acute bony or joint abnormality is identified. Degenerative disease about the left knee noted.  IMPRESSION: No acute finding.  Original Report Authenticated By: Bernadene Bell. D'ALESSIO, M.D.   Dg Ankle Complete Left  05/18/2011  *RADIOLOGY REPORT*  Clinical Data: Fall, pain.  LEFT ANKLE COMPLETE - 3+ VIEW  Comparison: None.  Findings: There is no fracture or dislocation.  Lucency in the lateral talar dome is consistent with an osteochondral defect.  No joint effusion.  IMPRESSION:  1.  No acute finding. 2.  Osteochondral defect lateral talar dome.  Original Report Authenticated By: Bernadene Bell. Maricela Curet, M.D.   Ct Head Wo Contrast  05/18/2011  *RADIOLOGY REPORT*  Clinical Data: Fall, head injury, loss of consciousness  CT HEAD WITHOUT CONTRAST  Technique:   Contiguous axial images were obtained from the base of the skull through the vertex without contrast.  Comparison: None.  Findings: No evidence of parenchymal hemorrhage or extra-axial fluid collection. No mass lesion, mass effect, or midline shift.  No CT evidence of acute infarction.  Mild subcortical white matter and periventricular small vessel ischemic changes.  Cerebral volume is age appropriate.  No ventriculomegaly.  The visualized paranasal sinuses are essentially clear. The mastoid air cells are unopacified.  No evidence of calvarial fracture.  IMPRESSION: No evidence of acute intracranial abnormality.  Mild small vessel ischemic changes.  Original Report Authenticated By: Charline Bills, M.D.   Ct Shoulder Left Wo Contrast  05/18/2011  *RADIOLOGY REPORT*  Clinical Data: Fall, left shoulder fracture on radiograph.  CT OF THE LEFT SHOULDER WITHOUT CONTRAST  Technique:  Multidetector CT imaging was performed according to the standard protocol. Multiplanar CT image reconstructions were also generated.  Comparison: 05/18/2011 radiograph  Findings: There is a comminuted fracture of the left humeral head and surgical neck, with medial and superior displacement of the distal segment.  The humeral head is rotated and slightly subluxed posteriorly without dislocation.  Glenohumeral DJD is present.  The clavicle, scapula, and left upper ribs are intact.  The left upper lung is clear.  Swelling/hematoma of the overlying fat and musculature. Cannot evaluate the vasculature without intravenous contrast.  IMPRESSION: Comminuted fracture of the left humeral head/neck.  Original Report Authenticated By: Waneta Martins, M.D.   Dg Shoulder Left  05/18/2011  *RADIOLOGY REPORT*  Clinical Data: Fall, pain.  LEFT SHOULDER - 2+ VIEW  Comparison: None.  Findings: The patient has a surgical neck fracture of the left humerus with approximately one shaft width anterior  displacement and 2.5 cm of fragment override.  The  acromioclavicular joint is intact.  Imaged left lung and ribs appear normal.  IMPRESSION: Surgical neck fracture left humerus.  Original Report Authenticated By: Bernadene Bell. D'ALESSIO, M.D.   Dg Humerus Left  05/18/2011  *RADIOLOGY REPORT*  Clinical Data: Fall, pain.  LEFT HUMERUS - 2+ VIEW  Comparison: None.  Findings: Surgical neck fracture of the left humerus as seen on dedicated plain films of the left shoulder again noted.  No other acute bony or joint abnormality is identified.  IMPRESSION: Surgical neck fracture left humerus.  Original Report Authenticated By: Bernadene Bell. D'ALESSIO, M.D.     1. Closed fracture of left humerus       MDM  The patient was evaluated by myself.  Based on her injuries patient had a CT of the head given her use of aspirin as well as her brief loss of consciousness. This was negative. The patient had a CBC and renal performed. This showed a very mild hypokalemia but was otherwise within normal limits. This was a mechanical fall and additional laboratory workup was unnecessary. CT head was negative. Patient had plain films of her left upper [place] and left lower extremity. Patient did have a proximal humerus fracture. No other bony injuries are identified. Patient's skin wounds were skin tears and did not require repair. They were cleaned and had antibiotic ointment applied. I spoke with Dr. Shon Baton who is on for orthopedics. He presented to the emergency department and evaluated the patient. Though he initially felt that a sling would be all that was necessary he did request CT of the left shoulder following evaluation of the patient in the emergency department. Patient was placed in swelling in her pain was controlled. Medications. At this time CT of the shoulder is pending.  Care was signed out to PA Jerral Ralph who will follow the patient and arrange for final disposition. Please see his note for details.        Cyndra Numbers, MD 05/18/11 2300

## 2011-05-18 NOTE — ED Notes (Signed)
Dr. Shon Baton in to assess pt . Pt to CT via stretcher

## 2011-05-18 NOTE — ED Notes (Signed)
Pt requesting pain med.  At this time

## 2011-05-18 NOTE — ED Notes (Signed)
Pt st's pain is returning, rates pain #9 on pain scale.  Pain med given.  Family at bedside.

## 2011-05-18 NOTE — Progress Notes (Signed)
Patient ID: [name], female   DOB: [birth year], 70 y.o.   MRN: [id number] Reviewed CT scan Agree with radiologist No dislocation comminuted fx   Plan: ok for d/c to home Call Monday to arrange f/u with Dr Ciro Backer at [facility] for next week I will forward a message to Dr Ciro Backer to make sure he is aware Call or return for increase in pain, numbess, weakness, fever, or chills. Pain medications per ED  Sling at all times

## 2011-05-18 NOTE — Consult Note (Signed)
No primary provider on file. Chief Complaint: Left proximal humerus fracture  History: [name] Terri a very pleasant 70 year old woman who was in her usual state of health until she fell today while trying to change to light. She fell and landed on her left side. She noted immediate left shoulder pain difficulty with range of motion. As a result she was brought to the emergency room. X-rays demonstrated a impacted soft proximal humerus fracture. As a result orthopedic consultation was requested.  The patient Terri well-known to [facility] orth[facility] and she indicates that Dr. Ciro Backer Terri her orthopedic surgeon.  Past Medical History  Diagnosis Date  . Helicobacter pylori (H. pylori)   . Hiatal hernia   . Other and unspecified hyperlipidemia   . Fibromyalgia   . Osteoarthritis   . Hyperlipidemia   . Hypertension   . ACL tear     right  Dr. Lowella Curb   . Glaucoma     (SE) Dr. Lovell Sheehan   . Arthritis   . Post-menopausal   . Depression   . Fibromyalgia   . B12 deficiency   . Tremor   . DUB (dysfunctional uterine bleeding) 10/96  . Insomnia   . Constipation   . ETD (eustachian tube dysfunction)   . MVP (mitral valve prolapse)   . Adenomatous polyp   . Osteopenia   . Cataracts, both eyes 10/2006    Allergies  Allergen Reactions  . Fosamax Other (See Comments)    Reaction unknown  . Latex Other (See Comments)    RA to latex 1982   . Levaquin Other (See Comments)    Reaction unknown  . Lipitor (Atorvastatin Calcium) Other (See Comments)    Reaction unknown  . Lyrica (Pregabalin) Other (See Comments)    Reaction unknown  . Meloxicam Other (See Comments)    Vaginal itching  . Meridia Other (See Comments)    Reaction unknown  . Mobic     Vaginal itching   . Motrin (Ibuprofen) Other (See Comments)    Reaction unknown    No current facility-administered medications on file prior to encounter.   Current Outpatient Prescriptions on File Prior to Encounter  Medication Sig  Dispense Refill  . aspirin (SB LOW DOSE ASA EC) 81 MG EC tablet Take 81 mg by mouth daily.        Marland Kitchen atenolol (TENORMIN) 50 MG tablet Take 50 mg by mouth daily.        . celecoxib (CELEBREX) 200 MG capsule Take 200 mg by mouth daily.       . clorazepate (TRANXENE) 7.5 MG tablet Take 7.5 mg by mouth daily as needed. For anxiety      . hydrocortisone (PROCTOZONE-HC) 2.5 % rectal cream Place 1 application rectally 2 (two) times daily as needed. For constipation      . Multiple Vitamins-Minerals (SENIOR MULTIVITAMIN PLUS PO) Take by mouth daily.        . Omeprazole Magnesium (PRILOSEC OTC PO) Take 1 capsule by mouth daily. For reflux      . Vitamin B1-B12 (NEURO B-12 IJ) Inject 1 vial as directed every 30 (thirty) days.       Marland Kitchen zolpidem (AMBIEN) 10 MG tablet Take 10 mg by mouth at bedtime as needed. For sleep        Physical Exam: Filed Vitals:   05/18/11 1639  BP: 140/90  Pulse: 75  Resp: 18   Patient Terri alert and oriented x3 she has no shortness of breath chest pain abdomen Terri  soft and nontender. Compartments in the lower extremity are soft and nontender intact radial and ulnar pulses bilaterally in the upper extremity.  She has no significant swelling at the level the left shoulder but the compartments in the forearm are soft and nontender. PIN, median ulnar nerve function are all intact. No elbow or wrist tenderness with palpation. Some superficial abrasions at the level of the elbow but no evidence of any significant bony pathology at the level of the elbow.  Image: Dg Femur Left  05/18/2011  *RADIOLOGY REPORT*  Clinical Data: Status post fall.  LEFT FEMUR - 2 VIEW  Comparison: None.  Findings: No acute bony or joint abnormality Terri identified. Degenerative change about the left knee Terri seen.  The patient also has degenerative disease of the symphysis pubis.  IMPRESSION: No acute finding.  Original Report Authenticated By: Bernadene Bell. D'ALESSIO, M.D.   Dg Tibia/fibula Left  05/18/2011   *RADIOLOGY REPORT*  Clinical Data: Fall, pain.  LEFT TIBIA AND FIBULA - 2 VIEW  Comparison: None.  Findings: No acute bony or joint abnormality Terri identified. Degenerative disease about the left knee noted.  IMPRESSION: No acute finding.  Original Report Authenticated By: Bernadene Bell. D'ALESSIO, M.D.   Dg Ankle Complete Left  05/18/2011  *RADIOLOGY REPORT*  Clinical Data: Fall, pain.  LEFT ANKLE COMPLETE - 3+ VIEW  Comparison: None.  Findings: There Terri no fracture or dislocation.  Lucency in the lateral talar dome Terri consistent with an osteochondral defect.  No joint effusion.  IMPRESSION:  1.  No acute finding. 2.  Osteochondral defect lateral talar dome.  Original Report Authenticated By: Bernadene Bell. Maricela Curet, M.D.   Ct Head Wo Contrast  05/18/2011  *RADIOLOGY REPORT*  Clinical Data: Fall, head injury, loss of consciousness  CT HEAD WITHOUT CONTRAST  Technique:  Contiguous axial images were obtained from the base of the skull through the vertex without contrast.  Comparison: None.  Findings: No evidence of parenchymal hemorrhage or extra-axial fluid collection. No mass lesion, mass effect, or midline shift.  No CT evidence of acute infarction.  Mild subcortical white matter and periventricular small vessel ischemic changes.  Cerebral volume Terri age appropriate.  No ventriculomegaly.  The visualized paranasal sinuses are essentially clear. The mastoid air cells are unopacified.  No evidence of calvarial fracture.  IMPRESSION: No evidence of acute intracranial abnormality.  Mild small vessel ischemic changes.  Original Report Authenticated By: Charline Bills, M.D.   Dg Shoulder Left  05/18/2011  *RADIOLOGY REPORT*  Clinical Data: Fall, pain.  LEFT SHOULDER - 2+ VIEW  Comparison: None.  Findings: The patient has a surgical neck fracture of the left humerus with approximately one shaft width anterior displacement and 2.5 cm of fragment override.  The acromioclavicular joint Terri intact.  Imaged left lung and  ribs appear normal.  IMPRESSION: Surgical neck fracture left humerus.  Original Report Authenticated By: Bernadene Bell. D'ALESSIO, M.D.   Dg Humerus Left  05/18/2011  *RADIOLOGY REPORT*  Clinical Data: Fall, pain.  LEFT HUMERUS - 2+ VIEW  Comparison: None.  Findings: Surgical neck fracture of the left humerus as seen on dedicated plain films of the left shoulder again noted.  No other acute bony or joint abnormality Terri identified.  IMPRESSION: Surgical neck fracture left humerus.  Original Report Authenticated By: Bernadene Bell. Maricela Curet, M.D.    A/P: Patient Terri status post a fall with a surgical neck fracture left humerus. She'll he has 2 views. There Terri some displacement and possibly rotational deformity.  At this point, I do think since she Terri neurologically intact that she can be discharged to home. However prior to discharge a day one in a CT scan of her left shoulder to get a better idea of the extent of the bony pathology.  I have ordered a sling for her we'll continue to ice the shoulder and I will get a CT scan. As long as the shoulder Terri non-dislocated and she Terri comfortable she can be discharged home. However followup with my partner Dr. Ciro Backer for a next week.

## 2011-05-19 MED ORDER — MORPHINE SULFATE 4 MG/ML IJ SOLN
4.0000 mg | Freq: Once | INTRAMUSCULAR | Status: AC
  Administered 2011-05-19: 4 mg via INTRAVENOUS
  Filled 2011-05-19: qty 1

## 2011-05-19 MED ORDER — HYDROCODONE-ACETAMINOPHEN 5-325 MG PO TABS: 2.0000 | ORAL_TABLET | ORAL | Status: AC | PRN

## 2011-05-19 NOTE — ED Notes (Signed)
Pt declined carrying clothes home, clothes were cut off during arrival

## 2011-05-19 NOTE — ED Provider Notes (Signed)
Medical screening examination/treatment/procedure(s) were conducted as a shared visit with non-physician practitioner(s) and myself.  I personally evaluated the patient during the encounter  Patient was discharged per Dr. Shon Baton.  Cyndra Numbers, MD 05/19/11 [phone]

## 2011-05-19 NOTE — ED Provider Notes (Signed)
[name] S 1:30 AM  Spoke with Dr. Shon Baton on call for orthopedics. He has seen patient and he reviewed the CT scan findings. He wrote no inpatient chart saying patient can be sent home and followed up with Dr. Myra Rude in the office.  Angus Seller, [place] 05/19/11 [phone]

## 2011-05-19 NOTE — ED Notes (Signed)
R leg skin tear on lateral side, tegaderm removed, pressure applied, bleeding controlled, & clean tegaderm applied, pt tolerated procedure well

## 2011-05-26 ENCOUNTER — Other Ambulatory Visit: Payer: Self-pay | Admitting: Physician Assistant

## 2011-05-26 ENCOUNTER — Encounter (HOSPITAL_COMMUNITY): Payer: Self-pay | Admitting: Pharmacy Technician

## 2011-05-29 MED ORDER — CEFAZOLIN SODIUM 1-5 GM-% IV SOLN
1.0000 g | INTRAVENOUS | Status: AC
  Administered 2011-05-30: 1 g via INTRAVENOUS
  Filled 2011-05-29: qty 50

## 2011-05-30 ENCOUNTER — Ambulatory Visit (HOSPITAL_COMMUNITY): Payer: Medicare Other

## 2011-05-30 ENCOUNTER — Other Ambulatory Visit: Payer: Self-pay

## 2011-05-30 ENCOUNTER — Encounter (HOSPITAL_COMMUNITY): Payer: Self-pay | Admitting: Anesthesiology

## 2011-05-30 ENCOUNTER — Inpatient Hospital Stay (HOSPITAL_COMMUNITY)
Admission: RE | Admit: 2011-05-30 | Discharge: 2011-06-01 | DRG: 494 | Disposition: A | Discharge status: Home Health | Payer: Medicare Other | Source: Ambulatory Visit | Attending: Orthopedic Surgery | Admitting: Orthopedic Surgery

## 2011-05-30 ENCOUNTER — Encounter (HOSPITAL_COMMUNITY): Payer: Self-pay | Admitting: Orthopedic Surgery

## 2011-05-30 ENCOUNTER — Ambulatory Visit (HOSPITAL_COMMUNITY): Payer: Medicare Other | Admitting: Anesthesiology

## 2011-05-30 ENCOUNTER — Encounter (HOSPITAL_COMMUNITY): Payer: Self-pay | Admitting: *Deleted

## 2011-05-30 ENCOUNTER — Encounter (HOSPITAL_COMMUNITY)
Admission: RE | Disposition: A | Discharge status: Home Health | Payer: Self-pay | Source: Ambulatory Visit | Attending: Orthopedic Surgery

## 2011-05-30 DIAGNOSIS — S81009A Unspecified open wound, unspecified knee, initial encounter: Secondary | ICD-10-CM | POA: Diagnosis present

## 2011-05-30 DIAGNOSIS — S42209A Unspecified fracture of upper end of unspecified humerus, initial encounter for closed fracture: Principal | ICD-10-CM | POA: Diagnosis present

## 2011-05-30 DIAGNOSIS — W06XXXA Fall from bed, initial encounter: Secondary | ICD-10-CM | POA: Diagnosis present

## 2011-05-30 DIAGNOSIS — I10 Essential (primary) hypertension: Secondary | ICD-10-CM | POA: Diagnosis present

## 2011-05-30 DIAGNOSIS — IMO0001 Reserved for inherently not codable concepts without codable children: Secondary | ICD-10-CM | POA: Diagnosis present

## 2011-05-30 DIAGNOSIS — K449 Diaphragmatic hernia without obstruction or gangrene: Secondary | ICD-10-CM | POA: Diagnosis present

## 2011-05-30 DIAGNOSIS — K219 Gastro-esophageal reflux disease without esophagitis: Secondary | ICD-10-CM | POA: Diagnosis present

## 2011-05-30 HISTORY — PX: ORIF HUMERUS FRACTURE: SHX2126

## 2011-05-30 HISTORY — DX: Anxiety disorder, unspecified: F41.9

## 2011-05-30 LAB — BASIC METABOLIC PANEL
BUN: 15 mg/dL (ref 6–23)
Creatinine, Ser: 0.63 mg/dL (ref 0.50–1.10)
GFR calc Af Amer: >90 mL/min (ref >90)
GFR calc non Af Amer: >90 mL/min (ref >90)

## 2011-05-30 LAB — SURGICAL PCR SCREEN: MRSA, PCR: NEGATIVE

## 2011-05-30 LAB — DIFFERENTIAL
Basophils Absolute: 0 10*3/uL (ref 0.0–0.1)
Basophils Relative: 0 % (ref 0–1)
Neutro Abs: 3.4 10*3/uL (ref 1.7–7.7)
Neutrophils Relative %: 61 % (ref 43–77)

## 2011-05-30 LAB — CBC
MCHC: 32.4 g/dL (ref 30.0–36.0)
Platelets: 313 10*3/uL (ref 150–400)
RDW: 15.6 % — ABNORMAL HIGH (ref 11.5–15.5)

## 2011-05-30 LAB — PROTIME-INR: INR: 1.04 (ref 0.00–1.49)

## 2011-05-30 SURGERY — OPEN REDUCTION INTERNAL FIXATION (ORIF) PROXIMAL HUMERUS FRACTURE
Anesthesia: Regional | Site: Shoulder | Laterality: Left | Wound class: Clean

## 2011-05-30 MED ORDER — LACTATED RINGERS IV SOLN
INTRAVENOUS | Status: DC | PRN
  Administered 2011-05-30: 17:00:00 via INTRAVENOUS

## 2011-05-30 MED ORDER — DROPERIDOL 2.5 MG/ML IJ SOLN
0.6250 mg | Freq: Once | INTRAMUSCULAR | Status: AC
  Administered 2011-05-30: 0.625 mg via INTRAVENOUS

## 2011-05-30 MED ORDER — BUPIVACAINE-EPINEPHRINE PF 0.5-1:200000 % IJ SOLN
INTRAMUSCULAR | Status: DC | PRN
  Administered 2011-05-30: 30 mL

## 2011-05-30 MED ORDER — MIDAZOLAM HCL 2 MG/2ML IJ SOLN
INTRAMUSCULAR | Status: AC
Start: 2011-05-30 — End: 2011-05-30
  Filled 2011-05-30: qty 2

## 2011-05-30 MED ORDER — ACETAMINOPHEN 325 MG PO TABS: 650.0000 mg | ORAL_TABLET | Freq: Four times a day (QID) | ORAL | Status: DC | PRN

## 2011-05-30 MED ORDER — PROMETHAZINE HCL 25 MG/ML IJ SOLN: 6.2500 mg | INTRAMUSCULAR | Status: DC | PRN

## 2011-05-30 MED ORDER — SENNA 8.6 MG PO TABS
1.0000 | ORAL_TABLET | Freq: Two times a day (BID) | ORAL | Status: DC
  Administered 2011-05-31 – 2011-06-01 (×4): 8.6 mg via ORAL
  Filled 2011-05-30 (×5): qty 1

## 2011-05-30 MED ORDER — OXYCODONE-ACETAMINOPHEN 5-325 MG PO TABS
1.0000 | ORAL_TABLET | ORAL | Status: DC | PRN
  Administered 2011-05-31 – 2011-06-01 (×5): 2 via ORAL
  Filled 2011-05-30 (×5): qty 2

## 2011-05-30 MED ORDER — METHOCARBAMOL 100 MG/ML IJ SOLN
500.0000 mg | Freq: Four times a day (QID) | INTRAVENOUS | Status: DC | PRN
  Filled 2011-05-30: qty 5

## 2011-05-30 MED ORDER — DROPERIDOL 2.5 MG/ML IJ SOLN: 0.6250 mg | Freq: Once | INTRAMUSCULAR | Status: DC

## 2011-05-30 MED ORDER — FENTANYL CITRATE 0.05 MG/ML IJ SOLN
INTRAMUSCULAR | Status: AC
  Filled 2011-05-30: qty 2

## 2011-05-30 MED ORDER — ONDANSETRON HCL 4 MG PO TABS
4.0000 mg | ORAL_TABLET | Freq: Four times a day (QID) | ORAL | Status: DC | PRN
  Administered 2011-06-01: 4 mg via ORAL
  Filled 2011-05-30: qty 1

## 2011-05-30 MED ORDER — POTASSIUM CHLORIDE IN NACL 20-0.9 MEQ/L-% IV SOLN
INTRAVENOUS | Status: DC
  Administered 2011-05-31 (×2): via INTRAVENOUS
  Filled 2011-05-30 (×3): qty 1000

## 2011-05-30 MED ORDER — NEOSTIGMINE METHYLSULFATE 1 MG/ML IJ SOLN
INTRAMUSCULAR | Status: DC | PRN
  Administered 2011-05-30: 4 mg via INTRAVENOUS

## 2011-05-30 MED ORDER — MIDAZOLAM HCL 2 MG/2ML IJ SOLN
INTRAMUSCULAR | Status: AC
  Filled 2011-05-30: qty 2

## 2011-05-30 MED ORDER — EPHEDRINE SULFATE 50 MG/ML IJ SOLN
INTRAMUSCULAR | Status: DC | PRN
  Administered 2011-05-30: 10 mg via INTRAVENOUS

## 2011-05-30 MED ORDER — SODIUM CHLORIDE 0.9 % IV SOLN: INTRAVENOUS | Status: DC

## 2011-05-30 MED ORDER — ROCURONIUM BROMIDE 100 MG/10ML IV SOLN
INTRAVENOUS | Status: DC | PRN
  Administered 2011-05-30: 40 mg via INTRAVENOUS

## 2011-05-30 MED ORDER — FENTANYL CITRATE 0.05 MG/ML IJ SOLN
50.0000 ug | INTRAMUSCULAR | Status: DC | PRN
  Administered 2011-05-30 (×2): 100 ug via INTRAVENOUS

## 2011-05-30 MED ORDER — BUPIVACAINE-EPINEPHRINE 0.25-1:200000 % IJ SOLN
INTRAMUSCULAR | Status: DC | PRN
  Administered 2011-05-30: 10 mL

## 2011-05-30 MED ORDER — CHLORHEXIDINE GLUCONATE 4 % EX LIQD: 60.0000 mL | Freq: Once | CUTANEOUS | Status: DC

## 2011-05-30 MED ORDER — ACETAMINOPHEN 650 MG RE SUPP: 650.0000 mg | Freq: Four times a day (QID) | RECTAL | Status: DC | PRN

## 2011-05-30 MED ORDER — ONDANSETRON HCL 4 MG/2ML IJ SOLN
INTRAMUSCULAR | Status: DC | PRN
  Administered 2011-05-30: 4 mg via INTRAVENOUS

## 2011-05-30 MED ORDER — ONDANSETRON HCL 4 MG/2ML IJ SOLN: 4.0000 mg | Freq: Four times a day (QID) | INTRAMUSCULAR | Status: DC | PRN

## 2011-05-30 MED ORDER — MUPIROCIN 2 % EX OINT
TOPICAL_OINTMENT | CUTANEOUS | Status: AC
  Administered 2011-05-30: 1 via NASAL
  Filled 2011-05-30: qty 22

## 2011-05-30 MED ORDER — LACTATED RINGERS IV SOLN
INTRAVENOUS | Status: DC
  Administered 2011-05-30: 15:00:00 via INTRAVENOUS

## 2011-05-30 MED ORDER — SODIUM CHLORIDE 0.9 % IR SOLN
Status: DC | PRN
  Administered 2011-05-30: 1

## 2011-05-30 MED ORDER — METOCLOPRAMIDE HCL 10 MG PO TABS: 5.0000 mg | ORAL_TABLET | Freq: Three times a day (TID) | ORAL | Status: DC | PRN

## 2011-05-30 MED ORDER — LACTATED RINGERS IV SOLN: INTRAVENOUS | Status: DC

## 2011-05-30 MED ORDER — HYDROMORPHONE HCL PF 1 MG/ML IJ SOLN
0.2500 mg | INTRAMUSCULAR | Status: DC | PRN
  Administered 2011-05-30 (×3): 0.25 mg via INTRAVENOUS

## 2011-05-30 MED ORDER — FENTANYL CITRATE 0.05 MG/ML IJ SOLN: 100.0000 ug | Freq: Once | INTRAMUSCULAR | Status: DC

## 2011-05-30 MED ORDER — GLYCOPYRROLATE 0.2 MG/ML IJ SOLN
INTRAMUSCULAR | Status: DC | PRN
  Administered 2011-05-30: .6 mg via INTRAVENOUS

## 2011-05-30 MED ORDER — PROPOFOL 10 MG/ML IV EMUL
INTRAVENOUS | Status: DC | PRN
  Administered 2011-05-30: 9 mL via INTRAVENOUS

## 2011-05-30 MED ORDER — FENTANYL CITRATE 0.05 MG/ML IJ SOLN
INTRAMUSCULAR | Status: DC | PRN
  Administered 2011-05-30: 50 ug via INTRAVENOUS
  Administered 2011-05-30 (×2): 100 ug via INTRAVENOUS

## 2011-05-30 MED ORDER — MIDAZOLAM HCL 2 MG/2ML IJ SOLN
1.0000 mg | INTRAMUSCULAR | Status: DC | PRN
  Administered 2011-05-30 (×2): 2 mg via INTRAVENOUS

## 2011-05-30 MED ORDER — METOCLOPRAMIDE HCL 5 MG/ML IJ SOLN
5.0000 mg | Freq: Three times a day (TID) | INTRAMUSCULAR | Status: DC | PRN
  Filled 2011-05-30: qty 2

## 2011-05-30 MED ORDER — DROPERIDOL 2.5 MG/ML IJ SOLN
INTRAMUSCULAR | Status: AC
  Filled 2011-05-30: qty 2

## 2011-05-30 MED ORDER — MORPHINE SULFATE 2 MG/ML IJ SOLN
2.0000 mg | INTRAMUSCULAR | Status: DC | PRN
  Administered 2011-05-31: 2 mg via INTRAVENOUS
  Filled 2011-05-30: qty 1

## 2011-05-30 MED ORDER — MIDAZOLAM HCL 5 MG/5ML IJ SOLN
INTRAMUSCULAR | Status: DC | PRN
  Administered 2011-05-30: 2 mg via INTRAVENOUS

## 2011-05-30 MED ORDER — CEFAZOLIN SODIUM 1-5 GM-% IV SOLN
1.0000 g | Freq: Four times a day (QID) | INTRAVENOUS | Status: AC
  Administered 2011-05-31 (×3): 1 g via INTRAVENOUS
  Filled 2011-05-30 (×3): qty 50

## 2011-05-30 MED ORDER — METHOCARBAMOL 500 MG PO TABS
500.0000 mg | ORAL_TABLET | Freq: Four times a day (QID) | ORAL | Status: DC | PRN
  Administered 2011-05-31: 500 mg via ORAL
  Filled 2011-05-30: qty 1

## 2011-05-30 SURGICAL SUPPLY — 75 items
BANDAGE GAUZE ELAST BULKY 4 IN (GAUZE/BANDAGES/DRESSINGS) ×1 IMPLANT
BIT DRILL 4 LONG FAST STEP (BIT) ×1 IMPLANT
BIT DRILL 4 SHORT FAST STEP (BIT) ×1 IMPLANT
BIT DRILL SNP 4.0MM (BIT) IMPLANT
BNDG COHESIVE 3X5 TAN STRL LF (GAUZE/BANDAGES/DRESSINGS) ×1 IMPLANT
CLOTH BEACON ORANGE TIMEOUT ST (SAFETY) ×2 IMPLANT
DRAPE INCISE IOBAN 66X45 STRL (DRAPES) ×2 IMPLANT
DRAPE PROXIMA HALF (DRAPES) ×1 IMPLANT
DRAPE U-SHAPE 47X51 STRL (DRAPES) ×2 IMPLANT
DRILL BIT SNP 4.0MM (BIT) ×1
DRSG ADAPTIC 3X8 NADH LF (GAUZE/BANDAGES/DRESSINGS) ×1 IMPLANT
DRSG EMULSION OIL 3X3 NADH (GAUZE/BANDAGES/DRESSINGS) ×1 IMPLANT
DRSG PAD ABDOMINAL 8X10 ST (GAUZE/BANDAGES/DRESSINGS) ×2 IMPLANT
DURAPREP 26ML APPLICATOR (WOUND CARE) ×2 IMPLANT
ELECT REM PT RETURN 9FT ADLT (ELECTROSURGICAL) ×2
ELECTRODE REM PT RTRN 9FT ADLT (ELECTROSURGICAL) ×1 IMPLANT
GAUZE SPONGE 4X4 12PLY STRL LF (GAUZE/BANDAGES/DRESSINGS) ×2 IMPLANT
GAUZE SPONGE 4X4 16PLY XRAY LF (GAUZE/BANDAGES/DRESSINGS) ×1 IMPLANT
GLOVE BIOGEL PI ORTHO PRO 7.5 (GLOVE) ×1
GLOVE BIOGEL PI ORTHO PRO SZ7 (GLOVE) ×2
GLOVE BIOGEL PI ORTHO PRO SZ8 (GLOVE) ×1
GLOVE ORTHO TXT STRL SZ7.5 (GLOVE) ×1 IMPLANT
GLOVE PI ORTHO PRO STRL 7.5 (GLOVE) ×1 IMPLANT
GLOVE PI ORTHO PRO STRL SZ7 (GLOVE) IMPLANT
GLOVE PI ORTHO PRO STRL SZ8 (GLOVE) ×1 IMPLANT
GLOVE SURG ORTHO 8.5 STRL (GLOVE) ×1 IMPLANT
GLOVE SURG SS PI 7.0 STRL IVOR (GLOVE) ×1 IMPLANT
GLOVE SURG SS PI 7.5 STRL IVOR (GLOVE) ×1 IMPLANT
GLOVE SURG SS PI 8.0 STRL IVOR (GLOVE) ×1 IMPLANT
GLOVE SURG SS PI 8.5 STRL IVOR (GLOVE) ×1
GLOVE SURG SS PI 8.5 STRL STRW (GLOVE) IMPLANT
GOWN STRL NON-REIN LRG LVL3 (GOWN DISPOSABLE) ×4 IMPLANT
GOWN STRL REIN XL XLG (GOWN DISPOSABLE) ×2 IMPLANT
KIT BASIN OR (CUSTOM PROCEDURE TRAY) ×2 IMPLANT
KIT ROOM TURNOVER OR (KITS) ×2 IMPLANT
MANIFOLD NEPTUNE II (INSTRUMENTS) ×2 IMPLANT
NDL SUT 6 .5 CRC .975X.05 MAYO (NEEDLE) ×1 IMPLANT
NEEDLE 22X1 1/2 (OR ONLY) (NEEDLE) ×1 IMPLANT
NEEDLE MAYO TAPER (NEEDLE) ×2
NS IRRIG 1000ML POUR BTL (IV SOLUTION) ×2 IMPLANT
PACK SHOULDER (CUSTOM PROCEDURE TRAY) ×2 IMPLANT
PAD ARMBOARD 7.5X6 YLW CONV (MISCELLANEOUS) ×4 IMPLANT
PASSER SUT SWANSON 36MM LOOP (INSTRUMENTS) ×2 IMPLANT
PEG STND 4.0X35MM (Orthopedic Implant) ×4 IMPLANT
PEG STND 4.0X42.5MM (Orthopedic Implant) ×4 IMPLANT
PEG STND 4.0X50.0MM (Orthopedic Implant) ×2 IMPLANT
PEGSTD 4.0X35MM (Orthopedic Implant) IMPLANT
PEGSTD 4.0X42.5MM (Orthopedic Implant) IMPLANT
PEGSTD 4.0X50.0MM (Orthopedic Implant) IMPLANT
PIN GUIDE SHOULDER 2.0MM (PIN) ×1 IMPLANT
PLATE SZ3 SHOULDER NAIL SNP (Plate) ×1 IMPLANT
SCREW SNP UNICORTICAL (Screw) ×3 IMPLANT
SLING ARM FOAM STRAP LRG (SOFTGOODS) ×1 IMPLANT
SPONGE GAUZE 4X4 12PLY (GAUZE/BANDAGES/DRESSINGS) ×3 IMPLANT
SPONGE LAP 18X18 X RAY DECT (DISPOSABLE) ×1 IMPLANT
SPONGE LAP 4X18 X RAY DECT (DISPOSABLE) ×3 IMPLANT
STAPLER VISISTAT 35W (STAPLE) ×2 IMPLANT
STRIP CLOSURE SKIN 1/2X4 (GAUZE/BANDAGES/DRESSINGS) ×2 IMPLANT
SUCTION FRAZIER TIP 10 FR DISP (SUCTIONS) ×2 IMPLANT
SUT BONE WAX W31G (SUTURE) IMPLANT
SUT ETHIBOND NAB CT1 #1 30IN (SUTURE) ×3 IMPLANT
SUT FIBERWIRE #2 38 T-5 BLUE (SUTURE) ×6
SUT MNCRL AB 4-0 PS2 18 (SUTURE) ×2 IMPLANT
SUT VIC AB 0 CT1 27 (SUTURE) ×2
SUT VIC AB 0 CT1 27XBRD ANBCTR (SUTURE) ×1 IMPLANT
SUT VIC AB 2-0 CT1 27 (SUTURE) ×2
SUT VIC AB 2-0 CT1 TAPERPNT 27 (SUTURE) ×2 IMPLANT
SUT VICRYL 4-0 PS2 18IN ABS (SUTURE) ×2 IMPLANT
SUTURE FIBERWR #2 38 T-5 BLUE (SUTURE) ×8 IMPLANT
SYR CONTROL 10ML LL (SYRINGE) ×2 IMPLANT
TAPE CLOTH SURG 6X10 WHT LF (GAUZE/BANDAGES/DRESSINGS) ×1 IMPLANT
TOWEL OR 17X24 6PK STRL BLUE (TOWEL DISPOSABLE) ×2 IMPLANT
TOWEL OR 17X26 10 PK STRL BLUE (TOWEL DISPOSABLE) ×2 IMPLANT
WATER STERILE IRR 1000ML POUR (IV SOLUTION) ×2 IMPLANT
YANKAUER SUCT BULB TIP NO VENT (SUCTIONS) ×2 IMPLANT

## 2011-05-30 NOTE — Transfer of Care (Signed)
Immediate Anesthesia Transfer of Care Note  Patient: [name]  Procedure(s) Performed:  OPEN REDUCTION INTERNAL FIXATION (ORIF) PROXIMAL HUMERUS FRACTURE - open reduction internal fixation of proximal humerus fracture  Patient Location: PACU  Anesthesia Type: General and GA combined with regional for post-op pain  Level of Consciousness: awake, alert , oriented and patient cooperative  Airway & Oxygen Therapy: Patient Spontanous Breathing and Patient connected to nasal cannula oxygen  Post-op Assessment: Report given to PACU RN, Post -op Vital signs reviewed and stable and Patient moving all extremities  Post vital signs: Reviewed and stable  Complications: No apparent anesthesia complications

## 2011-05-30 NOTE — Addendum Note (Signed)
Addendum  created 05/30/11 2129 by Melonie Florida, MD   Modules edited:Orders

## 2011-05-30 NOTE — Anesthesia Postprocedure Evaluation (Signed)
  Anesthesia Post-op Note  Patient: [name]  Procedure(s) Performed:  OPEN REDUCTION INTERNAL FIXATION (ORIF) PROXIMAL HUMERUS FRACTURE - open reduction internal fixation of proximal humerus fracture  Patient Location: PACU  Anesthesia Type: General  Level of Consciousness: awake, alert  and oriented  Airway and Oxygen Therapy: Patient Spontanous Breathing  Post-op Pain: mild  Post-op Assessment: Post-op Vital signs reviewed and Patient's Cardiovascular Status Stable  Post-op Vital Signs: stable  Complications: No apparent anesthesia complications and Patient re-intubated

## 2011-05-30 NOTE — Brief Op Note (Signed)
05/30/2011  7:52 PM  PATIENT:  [name]  70 y.o. female  PRE-OPERATIVE DIAGNOSIS:  LEFT PROXIMAL HUMERUS FRACTURE, DISPLACED  POST-OPERATIVE DIAGNOSIS: left proximal humerus fracture, comminuted, displaced  PROCEDURE:  Procedure(s): OPEN REDUCTION INTERNAL FIXATION (ORIF) PROXIMAL HUMERUS FRACTURE  SURGEON:  Surgeon(s): Verlee Rossetti  PHYSICIAN ASSISTANT:   ASSISTANTS: Thea Gist, PA-C   ANESTHESIA:   regional and general  EBL:  Total I/O In: 400 [I.V.:400] Out: -   BLOOD ADMINISTERED:none  DRAINS: none   LOCAL MEDICATIONS USED:  MARCAINE 10 CC  SPECIMEN:  No Specimen  DISPOSITION OF SPECIMEN:  N/A  COUNTS:  YES  TOURNIQUET:  * No tourniquets in log *  DICTATION: .Other Dictation: Dictation Number [id number]  PLAN OF CARE: Admit to inpatient   PATIENT DISPOSITION:  PACU - hemodynamically stable.   Delay start of Pharmacological VTE agent (>24hrs) due to surgical blood loss or risk of bleeding:  {YES/NO/NOT APPLICABLE:20182

## 2011-05-30 NOTE — Anesthesia Procedure Notes (Signed)
Anesthesia Regional Block:  Interscalene brachial plexus block  Pre-Anesthetic Checklist: ,, timeout performed, Correct Patient, Correct Site, Correct Laterality, Correct Procedure, Correct Position, site marked, Risks and benefits discussed, pre-op evaluation,  At surgeon's request and post-op pain management  Laterality: Left  Prep: chloraprep       Needles:  Injection technique: Single-shot  Needle Type: Stimulator Needle - 40     Needle Length:cm 4 cm Needle Gauge: 22 and 22 G    Additional Needles:  Procedures: nerve stimulator Interscalene brachial plexus block  Nerve Stimulator or Paresthesia:  Response: forearm twitch, 0.5 mA, 1 ms,   Additional Responses:   Narrative:  Start time: 05/30/2011 3:45 PM End time: 05/30/2011 3:55 PM Injection made incrementally with aspirations every 5 mL.  Performed by: Personally  Anesthesiologist: Sandford Craze, MD  Additional Notes: Pt identified in Holding room.  Monitors applied. Working IV access confirmed. Sterile prep.  #22ga PNS to forearm twitch at 0.90mA threshold.  30cc 0.5% Bupivacaine with 1:200k epi injected incrementally after negative test dose.  Patient asymptomatic, VSS, no heme aspirated, tolerated well.   Sandford Craze, MD

## 2011-05-30 NOTE — Preoperative (Signed)
Beta Blockers   Reason not to administer Beta Blockers:Pt tooke BB 05-30-11 in am

## 2011-05-30 NOTE — H&P (Signed)
  70 yo female LHD s/p fall off bed while changing a light bulb.  C/o immediate left shoulder pain. Patient presented to [facility] ED where XRAYs showed a broken left shoulder.  PMH: Fibromyalgia, HTN,   denies thyroid issues, heart disease, diabetes  PSH:  Cholesystectomy, Hernia surgery, R TKA (2010) Dr Darrelyn Hillock  Meds: Atenolol, Zocor, antidepressant, Ambien  KNDA  SH: non smoker, no EtOH  ROS: No dizziness  PE:  AAO x3, NAD  Left Shoulder:  Bruised, swollen, decreased ROM due to pain.  NVI L UE  R shoulder ROM normal  XRAY: Displaced 3 part proximal humerus fracture  A/P Left Shoulder displaced 3 part fracture Plan ORIF,  Discussed with patient treatment options recommending surgery.  She agrees with this plan.  Risks and benefits discussed.

## 2011-05-30 NOTE — Anesthesia Preprocedure Evaluation (Addendum)
Anesthesia Evaluation  Patient identified by MRN, date of birth, ID band Patient awake    Reviewed: Allergy & Precautions, H&P , NPO status , Patient's Chart, lab work & pertinent test results  Airway Mallampati: II TM Distance: >3 FB Neck ROM: Full    Dental No notable dental hx. (+) Teeth Intact and Missing   Pulmonary neg pulmonary ROS,  clear to auscultation  Pulmonary exam normal       Cardiovascular hypertension (took atenolol today), Pt. on medications and Pt. on home beta blockers Regular Normal 9/11 stress test- no ischemia, EF 72%   Neuro/Psych    GI/Hepatic hiatal hernia, GERD-  Controlled and Medicated,  Endo/Other    Renal/GU      Musculoskeletal  (+) Fibromyalgia - (celebrex)  Abdominal   Peds  Hematology   Anesthesia Other Findings   Reproductive/Obstetrics                          Anesthesia Physical Anesthesia Plan  ASA: II  Anesthesia Plan: General   Post-op Pain Management: MAC Combined w/ Regional for Post-op pain   Induction: Intravenous  Airway Management Planned: Oral ETT  Additional Equipment:   Intra-op Plan:   Post-operative Plan: Extubation in OR  Informed Consent: I have reviewed the patients History and Physical, chart, labs and discussed the procedure including the risks, benefits and alternatives for the proposed anesthesia with the patient or authorized representative who has indicated his/her understanding and acceptance.   Dental advisory given  Plan Discussed with: CRNA and Surgeon  Anesthesia Plan Comments: (Plan routine monitors, GETA with interscalene block for post op analgesia.)        Anesthesia Quick Evaluation

## 2011-05-31 LAB — BASIC METABOLIC PANEL
BUN: 14 mg/dL (ref 6–23)
CO2: 29 mEq/L (ref 19–32)
Glucose, Bld: 117 mg/dL — ABNORMAL HIGH (ref 70–99)
Potassium: 4.3 mEq/L (ref 3.5–5.1)
Sodium: 141 mEq/L (ref 135–145)

## 2011-05-31 MED ORDER — VITAMIN D3 25 MCG (1000 UT) PO CAPS: 1.0000 | ORAL_CAPSULE | Freq: Every day | ORAL | Status: DC

## 2011-05-31 MED ORDER — ATENOLOL 50 MG PO TABS
50.0000 mg | ORAL_TABLET | Freq: Every day | ORAL | Status: DC
  Administered 2011-06-01: 50 mg via ORAL
  Filled 2011-05-31 (×3): qty 1

## 2011-05-31 MED ORDER — HYDROCODONE-ACETAMINOPHEN 5-325 MG PO TABS: 2.0000 | ORAL_TABLET | ORAL | Status: DC | PRN

## 2011-05-31 MED ORDER — TRAVOPROST (BAK FREE) 0.004 % OP SOLN
1.0000 [drp] | Freq: Every day | OPHTHALMIC | Status: DC
  Filled 2011-05-31 (×2): qty 2.5

## 2011-05-31 MED ORDER — THERA M PLUS PO TABS
1.0000 | ORAL_TABLET | Freq: Every day | ORAL | Status: DC
  Administered 2011-06-01: 1 via ORAL
  Filled 2011-05-31 (×3): qty 1

## 2011-05-31 MED ORDER — TRAMADOL HCL 50 MG PO TABS: 50.0000 mg | ORAL_TABLET | Freq: Every day | ORAL | Status: DC | PRN

## 2011-05-31 MED ORDER — SENIOR MULTIVITAMIN PLUS PO TABS: 1.0000 | ORAL_TABLET | Freq: Every day | ORAL | Status: DC

## 2011-05-31 MED ORDER — VENLAFAXINE HCL ER 75 MG PO CP24
75.0000 mg | ORAL_CAPSULE | Freq: Every day | ORAL | Status: DC
  Administered 2011-06-01: 75 mg via ORAL
  Filled 2011-05-31 (×3): qty 1

## 2011-05-31 MED ORDER — ZOLPIDEM TARTRATE 5 MG PO TABS
5.0000 mg | ORAL_TABLET | Freq: Every evening | ORAL | Status: DC | PRN
  Administered 2011-05-31: 5 mg via ORAL
  Filled 2011-05-31: qty 1

## 2011-05-31 MED ORDER — HYDROCORTISONE 2.5 % RE CREA
1.0000 "application " | TOPICAL_CREAM | Freq: Two times a day (BID) | RECTAL | Status: DC | PRN
  Filled 2011-05-31: qty 28.35

## 2011-05-31 MED ORDER — ASPIRIN 81 MG PO TBEC: 81.0000 mg | DELAYED_RELEASE_TABLET | Freq: Every day | ORAL | Status: DC

## 2011-05-31 MED ORDER — CLORAZEPATE DIPOTASSIUM 7.5 MG PO TABS
7.5000 mg | ORAL_TABLET | Freq: Every day | ORAL | Status: DC | PRN
  Administered 2011-06-01: 7.5 mg via ORAL
  Filled 2011-05-31: qty 1

## 2011-05-31 MED ORDER — FLUTICASONE PROPIONATE 50 MCG/ACT NA SUSP
1.0000 | Freq: Every day | NASAL | Status: DC | PRN
  Filled 2011-05-31: qty 16

## 2011-05-31 MED ORDER — BISACODYL 5 MG PO TBEC: 10.0000 mg | DELAYED_RELEASE_TABLET | Freq: Every day | ORAL | Status: DC | PRN

## 2011-05-31 MED ORDER — ASPIRIN EC 81 MG PO TBEC
81.0000 mg | DELAYED_RELEASE_TABLET | Freq: Every day | ORAL | Status: DC
  Administered 2011-06-01: 81 mg via ORAL
  Filled 2011-05-31: qty 1

## 2011-05-31 MED ORDER — CELECOXIB 200 MG PO CAPS
200.0000 mg | ORAL_CAPSULE | Freq: Every day | ORAL | Status: DC
  Administered 2011-06-01: 200 mg via ORAL
  Filled 2011-05-31: qty 1

## 2011-05-31 MED ORDER — ROSUVASTATIN CALCIUM 20 MG PO TABS
20.0000 mg | ORAL_TABLET | Freq: Every day | ORAL | Status: DC
  Administered 2011-05-31 – 2011-06-01 (×2): 20 mg via ORAL
  Filled 2011-05-31 (×2): qty 1

## 2011-05-31 MED ORDER — VITAMIN D3 25 MCG (1000 UNIT) PO TABS
1000.0000 [IU] | ORAL_TABLET | Freq: Every day | ORAL | Status: DC
  Administered 2011-06-01: 1000 [IU] via ORAL
  Filled 2011-05-31 (×3): qty 1

## 2011-05-31 MED ORDER — PANTOPRAZOLE SODIUM 40 MG PO TBEC
40.0000 mg | DELAYED_RELEASE_TABLET | Freq: Every day | ORAL | Status: DC
  Administered 2011-05-31 – 2011-06-01 (×2): 40 mg via ORAL
  Filled 2011-05-31: qty 1

## 2011-05-31 NOTE — Op Note (Signed)
NAMEMARIBELL, DEMEO                ACCOUNT NO.:  [id number]  MEDICAL RECORD NO.:  [id number]  LOCATION:  5025                         FACILITY:  [facility]  PHYSICIAN:  Almedia Balls. Ranell Patrick, M.D. DATE OF BIRTH:  [birth year]  DATE OF PROCEDURE:  05/30/2011 DATE OF DISCHARGE:                              OPERATIVE REPORT   PREOPERATIVE DIAGNOSIS:  Left displaced proximal humerus fracture.  POSTOPERATIVE DIAGNOSES: 1. Left displaced proximal humerus fracture, comminuted and displaced     with difficult reduction. 2. Left pretibial distal leg contusion/open injury.  PROCEDURE PERFORMED:  Left shoulder open reduction and internal fixation of proximal humerus fracture using DePuy SNP.  We did a dressing change left lower leg.  ATTENDING SURGEON:  Almedia Balls. Ranell Patrick, MD  ASSISTANT:  Donnie Coffin. Dixon, PA-C  ANESTHESIA:  General anesthesia was used plus interscalene block.  ESTIMATED BLOOD LOSS:  Less than 100 mL.  FLUID REPLACEMENT:  1500 mL of crystalloid.  INSTRUMENT COUNTS:  Correct.  COMPLICATIONS:  None.  Perioperative antibiotics were given.  INDICATIONS:  The patient is a 70 year old female, who is left-hand dominant presents status post a fall injuring her left shoulder.  She presented to Orthopedics with a displaced proximal humerus fracture. Counseled the patient.  She is about a week and a half post injury when I saw her.  Counseled regarding this injury and the need to restore proximal humeral anatomy.  Her humeral head was completely posteriorly displaced and rotated with the humeral shaft anteriorly and superiorly displaced.  Discussed whether this would definitely go onto a nonunion and recommended ORIF of the shoulder.  She agreed to this.  Prior to her surgery  today, she discussed with me in the holding area that she had a dressing that needs to be changed on her leg, and I told her that we would do that while she was asleep, and she did agree to surgery, and informed  consent was obtained.  DESCRIPTION OF PROCEDURE:  After adequate level of anesthesia achieved, the patient was positioned in the modified beach chair position.  Left shoulder was sterilely prepped and draped in the usual manner.  After a time-out was called, we went ahead and did our deltopectoral approach starting at the coracoid process and ascending up in the anterior humerus.  I could palpate her anterior humeral shaft almost subcutaneously.  As we dissected down, I was able to identify the cephalic vein, took it laterally with the deltoid.  The pectoralis was taken medially.  We did not have to release any pec.  The humeral shaft was completely 100% displaced anteriorly and at this point, we were able to identify the fracture site, able to mobilize the deltoid.  There were already some adhesions forming as this was a week and a half out, but with Cobb elevators and C-arm fluoroscopy in multiple planes was able to reduce the humeral head back onto the humeral shaft.  There was extensive comminution at the fracture site.  I was able to place the SNP plate by DePuy down the humeral shaft, properly positioned, displaced to the biceps groove, and then was able to gain adequate reduction, and then placed 5 smooth pegs which  were locked into SNP up above and then 3 unicortical screws distally into the plate.  We had excellent security of the fracture site.  Everything moved together as a unit.  I fell happy with the reduction.  She had extensive comminution to fracture site making it more difficult.  It was a difficult reduction, but I was able to achieve a satisfactory proximal humerus reduction.  I did go ahead and placed a FiberWire suture in a mattress fashion posterior- superior to the greater tuberosity and brought that down through the holes in the plate to further secure the greater tuberosity/rotator cuff and take some pressure off the greater tuberosity, and everything  again moved together as a unit.  At this point, I thoroughly irrigated the subdeltoid interval, obtained our final x-rays, and then closed the deltopectoral interval with 0 Vicryl suture, followed by 2-0 Vicryl subcutaneous closure and 4-0 Monocryl for skin.  Steri-Strips were applied and sterile dressing.  We then directed our attention as she was waking up to the leg to do a dressing change, and as we unwrapped this, it became evident that this was more severe injury than just an abrasion and appeared to be about a 1-cm hole in the skin.  I was able to just applying pressure on the wound.  Organizing hematoma came from that wound as I palpated around, seemed like a pre significant area  may be 3 cm x 8 cm.  It seemed really pretty soft, and I think the skin was just basically all she had and not much down the bone.  We did go ahead and scrubbed it with Hibiclens around that area, then put a dry dressing, but I think that will have to be opened and debrided and then repaired. I discussed this with the family after surgery.     Almedia Balls. Ranell Patrick, M.D.     SRN/MEDQ  D:  05/30/2011  T:  05/31/2011  Job:  [id number]

## 2011-05-31 NOTE — Progress Notes (Signed)
Subjective: 1 Day Post-Op Procedure(s) (LRB): OPEN REDUCTION INTERNAL FIXATION (ORIF) PROXIMAL HUMERUS FRACTURE (Left) Patient reports pain as mild.    Objective: Vital signs in last 24 hours: Temp:  [97.6 F (36.4 C)-98.5 F (36.9 C)] 98.1 F (36.7 C) (12/04 0540) Pulse Rate:  [55-95] 68  (12/04 0540) Resp:  [11-21] 18  (12/04 0540) BP: (105-155)/(54-81) 105/58 mmHg (12/04 0540) SpO2:  [96 %-100 %] 98 % (12/04 0540) Weight:  [79.379 kg (175 lb)] 175 lb (79.379 kg) (12/03 1405)  Intake/Output from previous day: 12/03 0701 - 12/04 0700 In: 2020 [I.V.:2020] Out: 600 [Urine:450; Blood:150] Intake/Output this shift:     Basename 05/31/11 0520 05/30/11 1351  HGB 10.0* 10.8*    Basename 05/31/11 0520 05/30/11 1351  WBC -- 5.6  RBC -- 3.75*  HCT 31.4* 33.3*  PLT -- 313    Basename 05/31/11 0520 05/30/11 1351  NA 141 141  K 4.3 3.7  CL 104 104  CO2 29 27  BUN 14 15  CREATININE 0.54 0.63  GLUCOSE 117* 93  CALCIUM 8.5 8.8    Basename 05/30/11 1351  LABPT --  INR 1.04    Neurologically intact Intact pulses distally Dressing intact, Moving hand well  Assessment/Plan: 1 Day Post-Op Procedure(s) (LRB): OPEN REDUCTION INTERNAL FIXATION (ORIF) PROXIMAL HUMERUS FRACTURE (Left) Advance diet, OT per orders, Dr Victorino Dike to evaluate leg wound, labs ok  Nayshawn Mesta,STEVEN R 05/31/2011, 8:09 AM

## 2011-05-31 NOTE — Progress Notes (Signed)
OCCUPATIONAL THERAPY EVALUATION OT eval completed. Please see eval scanned into progress notes for details. OT to follow acutely for continued education. No DME needs.  05/31/2011 Cipriano Mile OTR/L Pager [phone] Office [phone]

## 2011-06-01 ENCOUNTER — Encounter (HOSPITAL_COMMUNITY): Payer: Self-pay | Admitting: *Deleted

## 2011-06-01 MED ORDER — METHOCARBAMOL 500 MG PO TABS: 500.0000 mg | ORAL_TABLET | Freq: Four times a day (QID) | ORAL | Status: AC | PRN

## 2011-06-01 NOTE — Progress Notes (Signed)
Occupational Therapy Treatment Patient Details Name: [name] MRN: [id number] DOB: 1940/11/17 Today's Date: 06/01/2011  OT Assessment/Plan OT Assessment/Plan Comments on Treatment Session: pt and husband without questions at the end of session OT Plan: Discharge plan remains appropriate Follow Up Recommendations: Other (comment) (follow up with MD for recommendations ) Equipment Recommended: None recommended by OT OT Goals ADL Goals Pt Will Perform Upper Body Dressing: with supervision;Sitting, bed;Unsupported ADL Goal: Upper Body Dressing - Progress: Progressing toward goals Arm Goals Additional Arm Goal #1: Pt will perform 3/3 pendulm exercises with I. Arm Goal: Additional Goal #1 - Progress: Progressing toward goals Additional Arm Goal #2: Pt will perform 2/2 AAROM exercises (wand and table slides) with I. Arm Goal: Additional Goal #2 - Progress: Progressing toward goals  OT Treatment Precautions/Restrictions  Precautions Precautions: Shoulder Required Braces or Orthoses: Yes Other Brace/Splint: Blue LUE shoulder sling Restrictions Weight Bearing Restrictions: Yes LUE Weight Bearing: Non weight bearing   ADL ADL Upper Body Bathing: Simulated;Left arm;Moderate assistance Where Assessed - Upper Body Bathing: Sitting, chair;Supported Location manager Dressing: Performed;Moderate assistance Where Assessed - Upper Body Dressing: Sitting, chair;Supported Lower Body Dressing: Performed;Maximal assistance Where Assessed - Lower Body Dressing: Sit to stand from chair Toilet Transfer: Simulated;Minimal assistance Toilet Transfer Method: Stand pivot Toilet Transfer Equipment: Other (comment) (sit<>stand from chair with BIL ue used ) Toileting - Clothing Manipulation: Moderate assistance Where Assessed - Toileting Clothing Manipulation: Sit to stand from 3-in-1 or toilet Toileting - Hygiene: Not assessed ADL Comments: 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 pt able to verbalize  correct sequence to wash under LUE. Pt required A with threading LUE in sleeve and button top. Mobility  Bed Mobility Bed Mobility: No Transfers Transfers: Yes Sit to Stand: 4: Min assist;With upper extremity assist;From chair/3-in-1 Stand to Sit: 4: Min assist;With upper extremity assist;To chair/3-in-1 Exercises Shoulder Exercises Pendulum Exercise: PROM;Left;10 reps;Standing (using education sheet as reference- Mod A) Elbow Flexion: AROM;10 reps;Seated;Left (educated to do once an hour in sling) Wrist Flexion: AROM;10 reps;Left;Seated Wrist Extension: AROM;Left;10 reps;Seated Digit Composite Flexion: AROM;Left;10 reps;Seated Composite Extension: AROM;Left;10 reps;Seated  End of Session OT - End of Session Equipment Utilized During Treatment:  (LUE sling) Activity Tolerance: Patient tolerated treatment well Patient left: in bed;with family/visitor present;with call bell in reach General Behavior During Session: [facility] for tasks performed Cognition: [facility] for tasks performed  [name]  06/01/2011, 1:20 PM Pager: [phone]

## 2011-06-01 NOTE — Discharge Summary (Signed)
Physician Discharge Summary  Patient ID: [name] MRN: [id number] DOB/AGE: [birth year] 70 y.o.  Admit date: 05/30/2011 Discharge date: 06/01/2011  Admission Diagnoses:  Principal Problem:  *Proximal humerus fracture   Discharge Diagnoses:  Same   Surgeries: Procedure(s): OPEN REDUCTION INTERNAL FIXATION (ORIF) PROXIMAL HUMERUS FRACTURE on 05/30/2011   Consultants: none  Discharged Condition: Stable  Hospital Course: [name] is an 70 y.o. female who was admitted 05/30/2011 with a chief complaint of No chief complaint on file. , and found to have a diagnosis of Proximal humerus fracture.  They were brought to the operating room on 05/30/2011 and underwent the above named procedures.    The patient had an uncomplicated hospital course and was stable for discharge.  Recent vital signs:  Filed Vitals:   06/01/11 0414  BP: 130/84  Pulse: 118  Temp: 97.3 F (36.3 C)  Resp: 20    Recent laboratory studies:  Results for orders placed during the hospital encounter of 05/30/11  BASIC METABOLIC PANEL      Component Value Range   Sodium 141  135 - 145 (mEq/L)   Potassium 3.7  3.5 - 5.1 (mEq/L)   Chloride 104  96 - 112 (mEq/L)   CO2 27  19 - 32 (mEq/L)   Glucose, Bld 93  70 - 99 (mg/dL)   BUN 15  6 - 23 (mg/dL)   Creatinine, Ser 4.09  0.50 - 1.10 (mg/dL)   Calcium 8.8  8.4 - 81.1 (mg/dL)   GFR calc non Af Amer >90  >90 (mL/min)   GFR calc Af Amer >90  >90 (mL/min)  CBC      Component Value Range   WBC 5.6  4.0 - 10.5 (K/uL)   RBC 3.75 (*) 3.87 - 5.11 (MIL/uL)   Hemoglobin 10.8 (*) 12.0 - 15.0 (g/dL)   HCT 91.4 (*) 78.2 - 46.0 (%)   MCV 88.8  78.0 - 100.0 (fL)   MCH 28.8  26.0 - 34.0 (pg)   MCHC 32.4  30.0 - 36.0 (g/dL)   RDW 95.6 (*) 21.3 - 15.5 (%)   Platelets 313  150 - 400 (K/uL)  DIFFERENTIAL      Component Value Range   Neutrophils Relative 61  43 - 77 (%)   Neutro Abs 3.4  1.7 - 7.7 (K/uL)   Lymphocytes Relative 28  12 - 46 (%)   Lymphs Abs 1.6  0.7 -  4.0 (K/uL)   Monocytes Relative 8  3 - 12 (%)   Monocytes Absolute 0.5  0.1 - 1.0 (K/uL)   Eosinophils Relative 3  0 - 5 (%)   Eosinophils Absolute 0.2  0.0 - 0.7 (K/uL)   Basophils Relative 0  0 - 1 (%)   Basophils Absolute 0.0  0.0 - 0.1 (K/uL)  PROTIME-INR      Component Value Range   Prothrombin Time 13.8  11.6 - 15.2 (seconds)   INR 1.04  0.00 - 1.49   SURGICAL PCR SCREEN      Component Value Range   MRSA, PCR NEGATIVE  NEGATIVE    Staphylococcus aureus POSITIVE (*) NEGATIVE   HEMOGLOBIN AND HEMATOCRIT, BLOOD      Component Value Range   Hemoglobin 10.0 (*) 12.0 - 15.0 (g/dL)   HCT 08.6 (*) 57.8 - 46.0 (%)  BASIC METABOLIC PANEL      Component Value Range   Sodium 141  135 - 145 (mEq/L)   Potassium 4.3  3.5 - 5.1 (mEq/L)   Chloride  104  96 - 112 (mEq/L)   CO2 29  19 - 32 (mEq/L)   Glucose, Bld 117 (*) 70 - 99 (mg/dL)   BUN 14  6 - 23 (mg/dL)   Creatinine, Ser 1.61  0.50 - 1.10 (mg/dL)   Calcium 8.5  8.4 - 09.6 (mg/dL)   GFR calc non Af Amer >90  >90 (mL/min)   GFR calc Af Amer >90  >90 (mL/min)    Discharge Medications:   Current Discharge Medication List    START taking these medications   Details  methocarbamol (ROBAXIN) 500 MG tablet Take 1 tablet (500 mg total) by mouth every 6 (six) hours as needed. Qty: 60 tablet, Refills: 1      CONTINUE these medications which have NOT CHANGED   Details  aspirin (SB LOW DOSE ASA EC) 81 MG EC tablet Take 81 mg by mouth daily.      atenolol (TENORMIN) 50 MG tablet Take 50 mg by mouth daily.      bisacodyl (BISACODYL) 5 MG EC tablet Take 10 mg by mouth daily as needed. For constipation     celecoxib (CELEBREX) 200 MG capsule Take 200 mg by mouth daily.     Cholecalciferol (VITAMIN D3) 1000 UNITS CAPS Take 1 capsule by mouth daily.      clorazepate (TRANXENE) 7.5 MG tablet Take 7.5 mg by mouth daily as needed. For anxiety    fluticasone (FLONASE) 50 MCG/ACT nasal spray Place 1 spray into the nose daily as needed. For  nasal congestion    hydrocortisone (PROCTOZONE-HC) 2.5 % rectal cream Place 1 application rectally 2 (two) times daily as needed. For constipation    Multiple Vitamins-Minerals (SENIOR MULTIVITAMIN PLUS PO) Take by mouth daily.      Omeprazole Magnesium (PRILOSEC OTC PO) Take 1 capsule by mouth daily. For acid reflux    simvastatin (ZOCOR) 80 MG tablet Take 40 mg by mouth at bedtime.      traMADol (ULTRAM) 50 MG tablet Take 50 mg by mouth daily as needed. For pain Maximum dose= 8 tablets per day     Travoprost, BAK Free, (TRAVATAN) 0.004 % SOLN ophthalmic solution Place 1 drop into the right eye at bedtime.      venlafaxine (EFFEXOR-XR) 75 MG 24 hr capsule Take 75 mg by mouth daily.      Vitamin B1-B12 (NEURO B-12 IJ) Inject 1 vial as directed every 30 (thirty) days.     zolpidem (AMBIEN) 10 MG tablet Take 10 mg by mouth at bedtime as needed. For sleep      STOP taking these medications     HYDROcodone-acetaminophen (NORCO) 5-325 MG per tablet         Diagnostic Studies: Dg Chest 1 View  05/30/2011  *RADIOLOGY REPORT*  Clinical Data: Recent bronchitis  CHEST - 1 VIEW  Comparison: 05/18/2011  Findings: The heart size and mediastinal contours are within normal limits.  Both lungs are clear.  The visualized skeletal structures are unremarkable.  IMPRESSION: No active disease.  Original Report Authenticated By: Rosealee Albee, M.D.   Dg Femur Left  05/18/2011  *RADIOLOGY REPORT*  Clinical Data: Status post fall.  LEFT FEMUR - 2 VIEW  Comparison: None.  Findings: No acute bony or joint abnormality is identified. Degenerative change about the left knee is seen.  The patient also has degenerative disease of the symphysis pubis.  IMPRESSION: No acute finding.  Original Report Authenticated By: Bernadene Bell. D'ALESSIO, M.D.   Dg Tibia/fibula Left  05/18/2011  *  RADIOLOGY REPORT*  Clinical Data: Fall, pain.  LEFT TIBIA AND FIBULA - 2 VIEW  Comparison: None.  Findings: No acute bony or joint  abnormality is identified. Degenerative disease about the left knee noted.  IMPRESSION: No acute finding.  Original Report Authenticated By: Bernadene Bell. D'ALESSIO, M.D.   Dg Ankle Complete Left  05/18/2011  *RADIOLOGY REPORT*  Clinical Data: Fall, pain.  LEFT ANKLE COMPLETE - 3+ VIEW  Comparison: None.  Findings: There is no fracture or dislocation.  Lucency in the lateral talar dome is consistent with an osteochondral defect.  No joint effusion.  IMPRESSION:  1.  No acute finding. 2.  Osteochondral defect lateral talar dome.  Original Report Authenticated By: Bernadene Bell. Maricela Curet, M.D.   Ct Head Wo Contrast  05/18/2011  *RADIOLOGY REPORT*  Clinical Data: Fall, head injury, loss of consciousness  CT HEAD WITHOUT CONTRAST  Technique:  Contiguous axial images were obtained from the base of the skull through the vertex without contrast.  Comparison: None.  Findings: No evidence of parenchymal hemorrhage or extra-axial fluid collection. No mass lesion, mass effect, or midline shift.  No CT evidence of acute infarction.  Mild subcortical white matter and periventricular small vessel ischemic changes.  Cerebral volume is age appropriate.  No ventriculomegaly.  The visualized paranasal sinuses are essentially clear. The mastoid air cells are unopacified.  No evidence of calvarial fracture.  IMPRESSION: No evidence of acute intracranial abnormality.  Mild small vessel ischemic changes.  Original Report Authenticated By: Charline Bills, M.D.   Ct Shoulder Left Wo Contrast  05/18/2011  *RADIOLOGY REPORT*  Clinical Data: Fall, left shoulder fracture on radiograph.  CT OF THE LEFT SHOULDER WITHOUT CONTRAST  Technique:  Multidetector CT imaging was performed according to the standard protocol. Multiplanar CT image reconstructions were also generated.  Comparison: 05/18/2011 radiograph  Findings: There is a comminuted fracture of the left humeral head and surgical neck, with medial and superior displacement of the  distal segment.  The humeral head is rotated and slightly subluxed posteriorly without dislocation.  Glenohumeral DJD is present.  The clavicle, scapula, and left upper ribs are intact.  The left upper lung is clear.  Swelling/hematoma of the overlying fat and musculature. Cannot evaluate the vasculature without intravenous contrast.  IMPRESSION: Comminuted fracture of the left humeral head/neck.  Original Report Authenticated By: Waneta Martins, M.D.   Dg Shoulder Left  05/18/2011  *RADIOLOGY REPORT*  Clinical Data: Fall, pain.  LEFT SHOULDER - 2+ VIEW  Comparison: None.  Findings: The patient has a surgical neck fracture of the left humerus with approximately one shaft width anterior displacement and 2.5 cm of fragment override.  The acromioclavicular joint is intact.  Imaged left lung and ribs appear normal.  IMPRESSION: Surgical neck fracture left humerus.  Original Report Authenticated By: Bernadene Bell. D'ALESSIO, M.D.   Dg Shoulder Left Port  05/30/2011  *RADIOLOGY REPORT*  Clinical Data: Postop left shoulder ORIF.  PORTABLE LEFT SHOULDER - 2+ VIEW  Comparison: CT and radiographs 05/18/2011.  Findings: Plate and screw fixation of the comminuted proximal humeral fracture has been performed in the interval.  The hardware appears well positioned.  On the Y-view, there is mild residual anterior displacement at the fracture.  The overall alignment is improved.  There is no dislocation.  Linear atelectasis is present at the left lung base.  IMPRESSION: Improved alignment of comminuted proximal left humeral fracture status post ORIF.  Original Report Authenticated By: Gerrianne Scale, M.D.   Dg Humerus  Left  05/18/2011  *RADIOLOGY REPORT*  Clinical Data: Fall, pain.  LEFT HUMERUS - 2+ VIEW  Comparison: None.  Findings: Surgical neck fracture of the left humerus as seen on dedicated plain films of the left shoulder again noted.  No other acute bony or joint abnormality is identified.  IMPRESSION: Surgical  neck fracture left humerus.  Original Report Authenticated By: Bernadene Bell. Maricela Curet, M.D.    Disposition: Home or Self Care  Discharge Orders    Future Orders Please Complete By Expires   Ambulatory referral to Home Health      Comments:   Please evaluate DAMARI HILTZ for admission to [facility].  Disciplines requested: nursing, PT and OT  Services to provide: eval and treat for the left shoulder and daily dry dressing to left lower leg  Physician to follow patient's care (the person listed here will be responsible for signing ongoing orders): dr. Ranell Patrick in 2 weeks  Requested Start of Care Date: 2-3 days  Special Instructions:  [facility] health care PT/OT for care of the left shoulder and nursing for daily dry dressing changes to the left leg   Diet - low sodium heart healthy      Call MD / Call 911      Comments:   If you experience chest pain or shortness of breath, CALL 911 and be transported to the hospital emergency room.  If you develope a fever above 101 F, pus (white drainage) or increased drainage or redness at the wound, or calf pain, call your surgeon's office.   Constipation Prevention      Comments:   Drink plenty of fluids.  Prune juice may be helpful.  You may use a stool softener, such as Colace (over the counter) 100 mg twice a day.  Use MiraLax (over the counter) for constipation as needed.   Increase activity slowly as tolerated      Weight Bearing as taught in Physical Therapy      Comments:   Use a walker or crutches as instructed.   Discharge instructions      Comments:   Follow up with Dr. Ranell Patrick in 2 weeks. No use of the left arm! Daily dry dressing to left lower leg   Driving restrictions      Comments:   No driving for at least 4 weeks   Lifting restrictions      Comments:   No lifting for 12 weeks   OT eval and treat      Comments:   This order was created through External Result Entry      Follow-up Information    Follow up with  NORRIS,STEVEN R. Call in 2 weeks.   Contact information:   [facility] [address], [address] [place] [address] [phone]           Signed: Thea Gist 06/01/2011, 11:30 AM

## 2011-07-11 ENCOUNTER — Ambulatory Visit: Payer: Medicare Other | Attending: Orthopedic Surgery | Admitting: Physical Therapy

## 2011-07-11 DIAGNOSIS — M25619 Stiffness of unspecified shoulder, not elsewhere classified: Secondary | ICD-10-CM | POA: Insufficient documentation

## 2011-07-11 DIAGNOSIS — M25519 Pain in unspecified shoulder: Secondary | ICD-10-CM | POA: Insufficient documentation

## 2011-07-11 DIAGNOSIS — IMO0001 Reserved for inherently not codable concepts without codable children: Secondary | ICD-10-CM | POA: Insufficient documentation

## 2011-07-11 DIAGNOSIS — R5381 Other malaise: Secondary | ICD-10-CM | POA: Insufficient documentation

## 2011-07-11 DIAGNOSIS — Z96659 Presence of unspecified artificial knee joint: Secondary | ICD-10-CM | POA: Insufficient documentation

## 2011-07-13 ENCOUNTER — Ambulatory Visit: Payer: Medicare Other | Admitting: Physical Therapy

## 2011-07-18 ENCOUNTER — Ambulatory Visit: Payer: Medicare Other | Admitting: Physical Therapy

## 2011-07-20 ENCOUNTER — Ambulatory Visit: Payer: Medicare Other | Admitting: Physical Therapy

## 2011-07-22 ENCOUNTER — Encounter: Payer: Medicare Other | Admitting: Physical Therapy

## 2011-07-22 ENCOUNTER — Ambulatory Visit: Payer: Medicare Other | Admitting: *Deleted

## 2011-07-25 ENCOUNTER — Encounter: Payer: Medicare Other | Admitting: Physical Therapy

## 2011-07-26 ENCOUNTER — Ambulatory Visit: Payer: Medicare Other | Admitting: Physical Therapy

## 2011-07-27 ENCOUNTER — Ambulatory Visit: Payer: Medicare Other | Admitting: Physical Therapy

## 2011-07-29 ENCOUNTER — Ambulatory Visit: Payer: Medicare Other | Attending: Orthopedic Surgery | Admitting: Physical Therapy

## 2011-07-29 DIAGNOSIS — IMO0001 Reserved for inherently not codable concepts without codable children: Secondary | ICD-10-CM | POA: Insufficient documentation

## 2011-07-29 DIAGNOSIS — R5381 Other malaise: Secondary | ICD-10-CM | POA: Insufficient documentation

## 2011-07-29 DIAGNOSIS — M25519 Pain in unspecified shoulder: Secondary | ICD-10-CM | POA: Insufficient documentation

## 2011-07-29 DIAGNOSIS — M25619 Stiffness of unspecified shoulder, not elsewhere classified: Secondary | ICD-10-CM | POA: Insufficient documentation

## 2011-07-29 DIAGNOSIS — Z96659 Presence of unspecified artificial knee joint: Secondary | ICD-10-CM | POA: Insufficient documentation

## 2011-08-01 ENCOUNTER — Ambulatory Visit: Payer: Medicare Other | Admitting: Physical Therapy

## 2011-08-03 ENCOUNTER — Ambulatory Visit: Payer: Medicare Other | Admitting: Physical Therapy

## 2011-08-05 ENCOUNTER — Ambulatory Visit: Payer: Medicare Other | Admitting: Physical Therapy

## 2011-08-08 ENCOUNTER — Ambulatory Visit: Payer: Medicare Other | Admitting: Physical Therapy

## 2011-08-10 ENCOUNTER — Encounter: Payer: Medicare Other | Admitting: Physical Therapy

## 2011-08-12 ENCOUNTER — Ambulatory Visit: Payer: Medicare Other | Admitting: *Deleted

## 2011-08-15 ENCOUNTER — Ambulatory Visit: Payer: Medicare Other | Admitting: Physical Therapy

## 2011-08-19 ENCOUNTER — Ambulatory Visit: Payer: Medicare Other | Admitting: Physical Therapy

## 2011-08-22 ENCOUNTER — Ambulatory Visit: Payer: Medicare Other | Admitting: Physical Therapy

## 2011-08-25 ENCOUNTER — Ambulatory Visit: Payer: Medicare Other | Admitting: Physical Therapy

## 2011-08-29 ENCOUNTER — Ambulatory Visit: Payer: Medicare Other | Attending: Orthopedic Surgery | Admitting: Physical Therapy

## 2011-08-29 DIAGNOSIS — M25619 Stiffness of unspecified shoulder, not elsewhere classified: Secondary | ICD-10-CM | POA: Insufficient documentation

## 2011-08-29 DIAGNOSIS — R5381 Other malaise: Secondary | ICD-10-CM | POA: Insufficient documentation

## 2011-08-29 DIAGNOSIS — IMO0001 Reserved for inherently not codable concepts without codable children: Secondary | ICD-10-CM | POA: Insufficient documentation

## 2011-08-29 DIAGNOSIS — M25519 Pain in unspecified shoulder: Secondary | ICD-10-CM | POA: Insufficient documentation

## 2011-09-02 ENCOUNTER — Ambulatory Visit: Payer: Medicare Other | Admitting: Physical Therapy

## 2011-09-06 ENCOUNTER — Ambulatory Visit: Payer: Medicare Other | Admitting: Physical Therapy

## 2011-09-09 ENCOUNTER — Ambulatory Visit: Payer: Medicare Other | Admitting: *Deleted

## 2011-09-12 ENCOUNTER — Ambulatory Visit: Payer: Medicare Other | Admitting: Physical Therapy

## 2011-09-14 ENCOUNTER — Ambulatory Visit: Payer: Medicare Other | Admitting: Physical Therapy

## 2011-09-19 ENCOUNTER — Ambulatory Visit: Payer: Medicare Other | Admitting: Physical Therapy

## 2011-09-21 ENCOUNTER — Ambulatory Visit: Payer: Medicare Other | Admitting: Physical Therapy

## 2011-09-26 ENCOUNTER — Ambulatory Visit: Payer: Medicare Other | Attending: Orthopedic Surgery | Admitting: Physical Therapy

## 2011-09-26 DIAGNOSIS — M25519 Pain in unspecified shoulder: Secondary | ICD-10-CM | POA: Insufficient documentation

## 2011-09-26 DIAGNOSIS — R5381 Other malaise: Secondary | ICD-10-CM | POA: Insufficient documentation

## 2011-09-26 DIAGNOSIS — M25619 Stiffness of unspecified shoulder, not elsewhere classified: Secondary | ICD-10-CM | POA: Insufficient documentation

## 2011-09-26 DIAGNOSIS — IMO0001 Reserved for inherently not codable concepts without codable children: Secondary | ICD-10-CM | POA: Insufficient documentation

## 2011-09-28 ENCOUNTER — Ambulatory Visit: Payer: Medicare Other | Admitting: Physical Therapy

## 2011-10-03 ENCOUNTER — Encounter: Payer: Medicare Other | Admitting: Physical Therapy

## 2012-04-26 ENCOUNTER — Other Ambulatory Visit: Payer: Self-pay | Admitting: Family Medicine

## 2012-04-26 DIAGNOSIS — Z1231 Encounter for screening mammogram for malignant neoplasm of breast: Secondary | ICD-10-CM

## 2012-06-01 ENCOUNTER — Ambulatory Visit
Admission: RE | Admit: 2012-06-01 | Discharge: 2012-06-01 | Disposition: A | Discharge status: Home / Self Care | Payer: Medicare Other | Source: Ambulatory Visit | Attending: Family Medicine | Admitting: Family Medicine

## 2012-06-01 DIAGNOSIS — Z1231 Encounter for screening mammogram for malignant neoplasm of breast: Secondary | ICD-10-CM

## 2012-06-08 ENCOUNTER — Other Ambulatory Visit: Payer: Self-pay | Admitting: Gastroenterology

## 2012-07-17 ENCOUNTER — Ambulatory Visit: Payer: Medicare Other | Attending: Family Medicine | Admitting: Physical Therapy

## 2012-07-17 DIAGNOSIS — M546 Pain in thoracic spine: Secondary | ICD-10-CM | POA: Insufficient documentation

## 2012-07-17 DIAGNOSIS — R5381 Other malaise: Secondary | ICD-10-CM | POA: Insufficient documentation

## 2012-07-17 DIAGNOSIS — IMO0001 Reserved for inherently not codable concepts without codable children: Secondary | ICD-10-CM | POA: Insufficient documentation

## 2012-07-17 DIAGNOSIS — M542 Cervicalgia: Secondary | ICD-10-CM | POA: Insufficient documentation

## 2012-07-17 DIAGNOSIS — R293 Abnormal posture: Secondary | ICD-10-CM | POA: Insufficient documentation

## 2012-07-23 ENCOUNTER — Ambulatory Visit: Payer: Medicare Other | Admitting: *Deleted

## 2012-07-25 ENCOUNTER — Encounter: Payer: Medicare Other | Admitting: Physical Therapy

## 2012-07-31 ENCOUNTER — Ambulatory Visit: Payer: Medicare Other | Attending: Family Medicine | Admitting: Physical Therapy

## 2012-07-31 DIAGNOSIS — IMO0001 Reserved for inherently not codable concepts without codable children: Secondary | ICD-10-CM | POA: Insufficient documentation

## 2012-07-31 DIAGNOSIS — R5381 Other malaise: Secondary | ICD-10-CM | POA: Insufficient documentation

## 2012-07-31 DIAGNOSIS — R293 Abnormal posture: Secondary | ICD-10-CM | POA: Insufficient documentation

## 2012-07-31 DIAGNOSIS — M542 Cervicalgia: Secondary | ICD-10-CM | POA: Insufficient documentation

## 2012-07-31 DIAGNOSIS — M546 Pain in thoracic spine: Secondary | ICD-10-CM | POA: Insufficient documentation

## 2012-08-03 ENCOUNTER — Ambulatory Visit: Payer: Medicare Other | Admitting: Physical Therapy

## 2012-08-06 ENCOUNTER — Ambulatory Visit: Payer: Medicare Other | Admitting: *Deleted

## 2012-08-08 ENCOUNTER — Ambulatory Visit: Payer: Medicare Other | Admitting: Physical Therapy

## 2012-08-14 ENCOUNTER — Ambulatory Visit: Payer: Medicare Other | Admitting: Physical Therapy

## 2012-08-17 ENCOUNTER — Ambulatory Visit: Payer: Medicare Other | Admitting: Physical Therapy

## 2012-08-20 ENCOUNTER — Ambulatory Visit: Payer: Medicare Other | Admitting: *Deleted

## 2012-08-23 ENCOUNTER — Ambulatory Visit: Payer: Medicare Other | Admitting: Physical Therapy

## 2012-08-28 ENCOUNTER — Ambulatory Visit: Payer: Medicare Other | Attending: Family Medicine | Admitting: Physical Therapy

## 2012-08-28 DIAGNOSIS — R5381 Other malaise: Secondary | ICD-10-CM | POA: Insufficient documentation

## 2012-08-28 DIAGNOSIS — R293 Abnormal posture: Secondary | ICD-10-CM | POA: Insufficient documentation

## 2012-08-28 DIAGNOSIS — M546 Pain in thoracic spine: Secondary | ICD-10-CM | POA: Insufficient documentation

## 2012-08-28 DIAGNOSIS — IMO0001 Reserved for inherently not codable concepts without codable children: Secondary | ICD-10-CM | POA: Insufficient documentation

## 2012-08-28 DIAGNOSIS — M542 Cervicalgia: Secondary | ICD-10-CM | POA: Insufficient documentation

## 2012-08-31 ENCOUNTER — Encounter: Payer: Medicare Other | Admitting: *Deleted

## 2012-09-03 ENCOUNTER — Ambulatory Visit: Payer: Medicare Other | Admitting: Physical Therapy

## 2012-10-05 ENCOUNTER — Other Ambulatory Visit: Payer: Self-pay | Admitting: Family Medicine

## 2012-10-11 ENCOUNTER — Other Ambulatory Visit: Payer: Self-pay | Admitting: *Deleted

## 2012-10-11 MED ORDER — ZOLPIDEM TARTRATE 10 MG PO TABS: 10.0000 mg | ORAL_TABLET | Freq: Every evening | ORAL | Status: DC | PRN

## 2012-10-11 NOTE — Telephone Encounter (Signed)
Pt last seen in office on 07-12-12. Rx last filled on 09-11-12 for #30 [facility]. Please advise. Thank you

## 2012-10-15 ENCOUNTER — Encounter: Payer: Self-pay | Admitting: *Deleted

## 2012-10-25 ENCOUNTER — Other Ambulatory Visit (INDEPENDENT_AMBULATORY_CARE_PROVIDER_SITE_OTHER): Payer: Medicare Other

## 2012-10-25 DIAGNOSIS — E559 Vitamin D deficiency, unspecified: Secondary | ICD-10-CM

## 2012-10-25 DIAGNOSIS — R5381 Other malaise: Secondary | ICD-10-CM

## 2012-10-25 DIAGNOSIS — I10 Essential (primary) hypertension: Secondary | ICD-10-CM

## 2012-10-25 DIAGNOSIS — E785 Hyperlipidemia, unspecified: Secondary | ICD-10-CM

## 2012-10-25 LAB — POCT CBC
HCT, POC: 35.8 % — AB (ref 37.7–47.9)
Hemoglobin: 11.8 g/dL — AB (ref 12.2–16.2)
MCHC: 32.9 g/dL (ref 31.8–35.4)
MPV: 8.7 fL (ref 0–99.8)
POC Granulocyte: 3.8 (ref 2–6.9)
POC LYMPH PERCENT: 32.7 %L (ref 10–50)
RBC: 4.3 M/uL (ref 4.04–5.48)

## 2012-10-25 LAB — BASIC METABOLIC PANEL WITH GFR
CO2: 27 mEq/L (ref 19–32)
Chloride: 106 mEq/L (ref 96–112)
Glucose, Bld: 102 mg/dL — ABNORMAL HIGH (ref 70–99)
Potassium: 4 mEq/L (ref 3.5–5.3)
Sodium: 143 mEq/L (ref 135–145)

## 2012-10-25 LAB — HEPATIC FUNCTION PANEL
ALT: 9 U/L (ref 0–35)
AST: 13 U/L (ref 0–37)
Alkaline Phosphatase: 82 U/L (ref 39–117)
Bilirubin, Direct: 0.1 mg/dL (ref 0.0–0.3)
Indirect Bilirubin: 0.4 mg/dL (ref 0.0–0.9)
Total Bilirubin: 0.5 mg/dL (ref 0.3–1.2)

## 2012-10-25 NOTE — Progress Notes (Signed)
Patient came in for labs only.

## 2012-10-26 LAB — NMR LIPOPROFILE WITH LIPIDS
Cholesterol, Total: 166 mg/dL (ref <200)
HDL Particle Number: 46.7 umol/L (ref >=30.5)
LDL Particle Number: 952 nmol/L (ref <1000)
Large HDL-P: 11.1 umol/L (ref >=4.8)
Large VLDL-P: 2.2 nmol/L (ref <=2.7)
Small LDL Particle Number: 578 nmol/L — ABNORMAL HIGH (ref <=527)
Triglycerides: 82 mg/dL (ref <150)
VLDL Size: 41.8 nm (ref <=46.6)

## 2012-10-26 LAB — VITAMIN D 25 HYDROXY (VIT D DEFICIENCY, FRACTURES): Vit D, 25-Hydroxy: 41 ng/mL (ref 30–89)

## 2012-11-01 ENCOUNTER — Encounter: Payer: Self-pay | Admitting: Family Medicine

## 2012-11-01 ENCOUNTER — Ambulatory Visit (INDEPENDENT_AMBULATORY_CARE_PROVIDER_SITE_OTHER): Payer: Medicare Other | Admitting: Family Medicine

## 2012-11-01 VITALS — BP 128/73 | HR 58 | Temp 98.2°F | Ht 63.0 in | Wt 195.0 lb

## 2012-11-01 DIAGNOSIS — J309 Allergic rhinitis, unspecified: Secondary | ICD-10-CM

## 2012-11-01 DIAGNOSIS — I10 Essential (primary) hypertension: Secondary | ICD-10-CM

## 2012-11-01 DIAGNOSIS — IMO0001 Reserved for inherently not codable concepts without codable children: Secondary | ICD-10-CM

## 2012-11-01 DIAGNOSIS — E785 Hyperlipidemia, unspecified: Secondary | ICD-10-CM

## 2012-11-01 DIAGNOSIS — F329 Major depressive disorder, single episode, unspecified: Secondary | ICD-10-CM

## 2012-11-01 DIAGNOSIS — M1990 Unspecified osteoarthritis, unspecified site: Secondary | ICD-10-CM

## 2012-11-01 MED ORDER — FLUTICASONE PROPIONATE 50 MCG/ACT NA SUSP: 2.0000 | Freq: Every day | NASAL | Status: DC

## 2012-11-01 MED ORDER — TRAVOPROST (BAK FREE) 0.004 % OP SOLN: 1.0000 [drp] | Freq: Every day | OPHTHALMIC | Status: DC

## 2012-11-01 MED ORDER — HYDROCORTISONE 2.5 % RE CREA: 1.0000 "application " | TOPICAL_CREAM | Freq: Two times a day (BID) | RECTAL | Status: DC | PRN

## 2012-11-01 MED ORDER — SIMVASTATIN 80 MG PO TABS: 80.0000 mg | ORAL_TABLET | Freq: Every day | ORAL | Status: DC

## 2012-11-01 MED ORDER — ZOLPIDEM TARTRATE 10 MG PO TABS: 10.0000 mg | ORAL_TABLET | Freq: Every evening | ORAL | Status: DC | PRN

## 2012-11-01 MED ORDER — ATENOLOL 50 MG PO TABS: 50.0000 mg | ORAL_TABLET | Freq: Every day | ORAL | Status: DC

## 2012-11-01 MED ORDER — VENLAFAXINE HCL ER 75 MG PO CP24: 75.0000 mg | ORAL_CAPSULE | Freq: Every day | ORAL | Status: DC

## 2012-11-01 MED ORDER — CELECOXIB 200 MG PO CAPS: 200.0000 mg | ORAL_CAPSULE | Freq: Every day | ORAL | Status: DC

## 2012-11-01 NOTE — Patient Instructions (Addendum)
Fall precautions discussed Continue current meds and therapeutic lifestyle changes When you have completed the current vitamin D, change to 5000 four days weekly

## 2012-11-01 NOTE — Progress Notes (Signed)
  Subjective:    Patient ID: [name], female    DOB: 1940/12/19, 72 y.o.   MRN: [id number]  HPI This patient presents for recheck of multiple medical problems. No one accompanies the patient today.  Patient Active Problem List   Diagnosis Date Noted  . Proximal humerus fracture 05/30/2011  . CHEST PAIN, PRECORDIAL 02/26/2010  . HYPERLIPIDEMIA 02/23/2010  . DEPRESSION 02/23/2010  . GLAUCOMA 02/23/2010  . CATARACTS 02/23/2010  . RHEUMATIC FEVER 02/23/2010  . HYPERTENSION 02/23/2010  . MITRAL VALVE PROLAPSE 02/23/2010  . GERD 02/23/2010  . OSTEOARTHRITIS 02/23/2010  . FIBROMYALGIA 02/23/2010    In addition, see review of systems. Patient saw [place] [facility] for problems with her right knee and her left shoulder. She received an injection in her left shoulder which helped and it also helped all the arthritis in her neck and thoracic spine.  The allergies, current medications, past medical history, surgical history, family and social history are reviewed.  Immunizations reviewed.  Health maintenance reviewed.  The following items are outstanding:none      Review of Systems  Constitutional: Positive for appetite change (slightly decreased since having virus> 1 mth ago) and fatigue.  HENT: Positive for voice change (hoarseness) and postnasal drip.   Eyes: Positive for itching (some, due to allergies).  Respiratory: Positive for cough and wheezing.   Cardiovascular: Negative.   Gastrointestinal: Negative.   Endocrine: Negative.   Genitourinary: Negative.   Musculoskeletal: Positive for arthralgias (all over due to fibromyalgia).  Skin: Negative.   Allergic/Immunologic: Positive for environmental allergies (seasonal).  Neurological: Positive for dizziness (positional).  Hematological: Bruises/bleeds easily.  Psychiatric/Behavioral: Negative.    labs reviewed with patient everything was basically good. And she knows to increase her vitamin D to 5000 units 4 days  weekly.     Objective:   Physical Exam BP 128/73  Pulse 58  Temp(Src) 98.2 F (36.8 C) (Oral)  Ht 5\' 3"  (1.6 m)  Wt 195 lb (88.451 kg)  BMI 34.55 kg/m2  The patient appeared well nourished and normally developed, alert and oriented to time and place. Speech, behavior and judgement appear normal. Vital signs as documented.  Head exam is unremarkable. No scleral icterus or pallor noted. There is some nasal congestion bilaterally. Ears and throat were normal.  Neck is without jugular venous distension, thyromegally, or carotid bruits. Carotid upstrokes are brisk bilaterally. No cervical adenopathy. Lungs are clear anteriorly and posteriorly to auscultation. Normal respiratory effort. Cardiac exam reveals regular rate and rhythm 72 per minute. First and second heart sounds normal.  No murmurs, rubs or gallops.  Abdominal exam reveals normal bowl sounds, no masses, no organomegaly and no aortic enlargement. No inguinal adenopathy. Slight epigastric tenderness due to scar tissue from previous surgery. No inguinal nodes palpated. Extremities are nonedematous and both femoral and pedal pulses are normal. Skin without pallor or jaundice.  Warm and dry, without rash. Neurologic exam reveals normal deep tendon reflexes and normal sensation. Lower extremity reflexes were somewhat asymmetric.          Assessment & Plan:   1. FIBROMYALGIA  2. DEPRESSION  3. HYPERLIPIDEMIA  4. HYPERTENSION 5. OSTEOARTHRITIS  6. Allergic rhinitis - fluticasone (FLONASE) 50 MCG/ACT nasal spray; Place 2 sprays into the nose daily.  Dispense: 16 g; Refill: 6  Patient Instructions  Fall precautions discussed Continue current meds and therapeutic lifestyle changes When you have completed the current vitamin D, change to 5000 four days weekly

## 2012-11-13 ENCOUNTER — Ambulatory Visit (INDEPENDENT_AMBULATORY_CARE_PROVIDER_SITE_OTHER): Payer: Medicare Other | Admitting: Family Medicine

## 2012-11-13 DIAGNOSIS — E538 Deficiency of other specified B group vitamins: Secondary | ICD-10-CM

## 2012-11-13 MED ORDER — CYANOCOBALAMIN 1000 MCG/ML IJ SOLN
1000.0000 ug | INTRAMUSCULAR | Status: DC
  Administered 2012-11-13 – 2017-06-07 (×46): 1000 ug via INTRAMUSCULAR

## 2012-12-20 ENCOUNTER — Other Ambulatory Visit: Payer: Self-pay

## 2012-12-20 MED ORDER — CLORAZEPATE DIPOTASSIUM 7.5 MG PO TABS: ORAL_TABLET | ORAL | Status: DC

## 2012-12-20 NOTE — Telephone Encounter (Signed)
Last seen 11/01/12   If approved call in and have nurse notify patient to pick up

## 2012-12-27 ENCOUNTER — Other Ambulatory Visit: Payer: Self-pay | Admitting: Family Medicine

## 2012-12-31 ENCOUNTER — Encounter: Payer: Self-pay | Admitting: Family Medicine

## 2012-12-31 ENCOUNTER — Ambulatory Visit (INDEPENDENT_AMBULATORY_CARE_PROVIDER_SITE_OTHER): Payer: Medicare Other | Admitting: Family Medicine

## 2012-12-31 VITALS — BP 161/69 | HR 65 | Temp 97.4°F | Ht 63.0 in | Wt 188.0 lb

## 2012-12-31 DIAGNOSIS — E538 Deficiency of other specified B group vitamins: Secondary | ICD-10-CM

## 2012-12-31 DIAGNOSIS — J209 Acute bronchitis, unspecified: Secondary | ICD-10-CM

## 2012-12-31 MED ORDER — AZITHROMYCIN 250 MG PO TABS: ORAL_TABLET | ORAL | Status: DC

## 2012-12-31 MED ORDER — BENZONATATE 100 MG PO CAPS: 100.0000 mg | ORAL_CAPSULE | Freq: Two times a day (BID) | ORAL | Status: DC | PRN

## 2012-12-31 NOTE — Progress Notes (Signed)
  Subjective:    Patient ID: Carola Frost, female    DOB: 13-Mar-1941, 72 y.o.   MRN: [id number]  HPI This 72 y.o. female presents for evaluation of cough and congestion for over a week.  She has been having a cough and URI sx's for over a week and she is feeling washed out and tired.   Review of Systems No chest pain, SOB, HA, dizziness, vision change, N/V, diarrhea, constipation, dysuria, urinary urgency or frequency, myalgias, arthralgias or rash.     Objective:   Physical Exam Vital signs noted  Well developed well nourished female.  HEENT - Head atraumatic Normocephalic                Eyes - PERRLA, Conjuctiva - clear Sclera- Clear EOMI                Ears - EAC's Wnl TM's Wnl Gross Hearing WNL                Nose - Nares patent                 Throat - oropharanx wnl Respiratory - Lungs CTA bilateral Cardiac - RRR S1 and S2 without murmur GI - Abdomen soft Nontender and bowel sounds active x 4 Extremities - No edema. Neuro - Grossly intact.       Assessment & Plan:  Acute bronchitis - Plan: azithromycin (ZITHROMAX) 250 MG tablet, benzonatate (TESSALON) 100 MG capsule.  Push po fluids, rest and follow up prn if sx's continue or persist.

## 2013-01-11 ENCOUNTER — Ambulatory Visit (INDEPENDENT_AMBULATORY_CARE_PROVIDER_SITE_OTHER): Payer: Medicare Other | Admitting: Physician Assistant

## 2013-01-11 VITALS — BP 138/78 | HR 76 | Temp 97.8°F | Ht 63.0 in | Wt 188.0 lb

## 2013-01-11 DIAGNOSIS — R062 Wheezing: Secondary | ICD-10-CM

## 2013-01-11 DIAGNOSIS — J029 Acute pharyngitis, unspecified: Secondary | ICD-10-CM

## 2013-01-11 LAB — POCT RAPID STREP A (OFFICE): Rapid Strep A Screen: NEGATIVE

## 2013-01-11 MED ORDER — FLUCONAZOLE 150 MG PO TABS: 150.0000 mg | ORAL_TABLET | Freq: Once | ORAL | Status: DC

## 2013-01-11 MED ORDER — MAGIC MOUTHWASH: 2.0000 mL | Freq: Three times a day (TID) | ORAL | Status: DC | PRN

## 2013-01-11 MED ORDER — ALBUTEROL SULFATE HFA 108 (90 BASE) MCG/ACT IN AERS: 2.0000 | INHALATION_SPRAY | Freq: Every evening | RESPIRATORY_TRACT | Status: DC | PRN

## 2013-01-11 MED ORDER — AMOXICILLIN-POT CLAVULANATE 875-125 MG PO TABS: 1.0000 | ORAL_TABLET | Freq: Two times a day (BID) | ORAL | Status: DC

## 2013-01-12 ENCOUNTER — Ambulatory Visit: Payer: Medicare Other

## 2013-01-13 NOTE — Progress Notes (Signed)
  Subjective:    Patient ID: [name], female    DOB: [birth year], 72 y.o.   MRN: [id number]  HPI 72 y/o female presents for white blisters in posterior pharynx an sore throat x 2 weeks.     Review of Systems  Constitutional: Positive for activity change (increased fatigue x 1 week), appetite change (appetitie change due to pain with swallowing) and fatigue. Negative for fever, chills, diaphoresis and unexpected weight change.  HENT: Positive for sore throat, mouth sores, trouble swallowing and voice change. Negative for hearing loss, ear pain, nosebleeds, congestion, facial swelling, rhinorrhea, sneezing, neck pain, neck stiffness, dental problem, postnasal drip, sinus pressure, tinnitus and ear discharge.   Eyes: Negative for photophobia, pain, discharge, redness, itching and visual disturbance.  Respiratory: Positive for cough (occasional cough at night since recent URI), shortness of breath (occasional since recent URI) and wheezing (at bedtime). Negative for apnea.   Cardiovascular: Negative.   Gastrointestinal: Negative.        Objective:   Physical Exam  Constitutional: She appears well-developed and well-nourished.  HENT:  Head: Normocephalic and atraumatic.  Right Ear: External ear normal.  Left Ear: External ear normal.  Mouth/Throat: Oropharyngeal exudate (white exudate and erythema in posterior pharynx and uvula bilaterally.  Few, <5, Small ulcerative appearing lesions. ) present.  Eyes: Right eye exhibits no discharge. Left eye exhibits no discharge.  Neck: No JVD present. No tracheal deviation present. No thyromegaly present.  Cardiovascular: Normal rate, regular rhythm, normal heart sounds and intact distal pulses.  Exam reveals no gallop and no friction rub.   No murmur heard. Pulmonary/Chest: Effort normal and breath sounds normal. No stridor. No respiratory distress. She has no wheezes. She has no rales. She exhibits no tenderness.  Lymphadenopathy:    She has no  cervical adenopathy.   Rapid strep negative.         Assessment & Plan:  1. Acute pharyngitis . I feel that this is secondary candidiasis related to recent antibiotic treatment of URI. Treat with Augmentin x 10 days for ulcerative lesions in addition to Diflucan x 3 pills for candidiasis. Magic mouthwash for symptomatic relief until symptoms resolve. F/u in 1 week if needed.

## 2013-03-05 ENCOUNTER — Other Ambulatory Visit (INDEPENDENT_AMBULATORY_CARE_PROVIDER_SITE_OTHER): Payer: Medicare Other | Admitting: *Deleted

## 2013-03-05 DIAGNOSIS — E559 Vitamin D deficiency, unspecified: Secondary | ICD-10-CM

## 2013-03-05 DIAGNOSIS — I10 Essential (primary) hypertension: Secondary | ICD-10-CM

## 2013-03-05 DIAGNOSIS — E785 Hyperlipidemia, unspecified: Secondary | ICD-10-CM

## 2013-03-05 DIAGNOSIS — E538 Deficiency of other specified B group vitamins: Secondary | ICD-10-CM

## 2013-03-05 LAB — POCT CBC
Granulocyte percent: 61.3 %G (ref 37–80)
HCT, POC: 33.5 % — AB (ref 37.7–47.9)
Hemoglobin: 11.3 g/dL — AB (ref 12.2–16.2)
MCV: 81.4 fL (ref 80–97)
POC Granulocyte: 3 (ref 2–6.9)
RBC: 4.1 M/uL (ref 4.04–5.48)

## 2013-03-05 NOTE — Progress Notes (Unsigned)
Pt came in for labs only 

## 2013-03-07 LAB — BMP8+EGFR
BUN: 11 mg/dL (ref 8–27)
Chloride: 104 mmol/L (ref 97–108)
Creatinine, Ser: 0.62 mg/dL (ref 0.57–1.00)
GFR calc Af Amer: 106 mL/min/{1.73_m2} (ref >59)
GFR calc non Af Amer: 92 mL/min/{1.73_m2} (ref >59)
Glucose: 96 mg/dL (ref 65–99)

## 2013-03-07 LAB — HEPATIC FUNCTION PANEL
Albumin: 4.3 g/dL (ref 3.5–4.8)
Alkaline Phosphatase: 108 IU/L (ref 39–117)
Total Bilirubin: 0.4 mg/dL (ref 0.0–1.2)
Total Protein: 6.1 g/dL (ref 6.0–8.5)

## 2013-03-07 LAB — NMR, LIPOPROFILE
Cholesterol: 175 mg/dL (ref <200)
HDL Cholesterol by NMR: 63 mg/dL (ref >=40)
LDL Particle Number: 1205 nmol/L — ABNORMAL HIGH (ref <1000)
LDL Size: 21.1 nm (ref >20.5)
Triglycerides by NMR: 90 mg/dL (ref <150)

## 2013-03-08 ENCOUNTER — Other Ambulatory Visit: Payer: Self-pay | Admitting: Nurse Practitioner

## 2013-03-14 ENCOUNTER — Ambulatory Visit (INDEPENDENT_AMBULATORY_CARE_PROVIDER_SITE_OTHER): Payer: Medicare Other | Admitting: Family Medicine

## 2013-03-14 ENCOUNTER — Ambulatory Visit (INDEPENDENT_AMBULATORY_CARE_PROVIDER_SITE_OTHER): Payer: Medicare Other

## 2013-03-14 ENCOUNTER — Encounter: Payer: Self-pay | Admitting: Family Medicine

## 2013-03-14 VITALS — BP 162/81 | HR 59 | Temp 97.0°F | Ht 63.0 in | Wt 190.0 lb

## 2013-03-14 DIAGNOSIS — R0789 Other chest pain: Secondary | ICD-10-CM

## 2013-03-14 DIAGNOSIS — M797 Fibromyalgia: Secondary | ICD-10-CM

## 2013-03-14 DIAGNOSIS — F329 Major depressive disorder, single episode, unspecified: Secondary | ICD-10-CM

## 2013-03-14 DIAGNOSIS — IMO0001 Reserved for inherently not codable concepts without codable children: Secondary | ICD-10-CM

## 2013-03-14 DIAGNOSIS — M1990 Unspecified osteoarthritis, unspecified site: Secondary | ICD-10-CM

## 2013-03-14 DIAGNOSIS — K219 Gastro-esophageal reflux disease without esophagitis: Secondary | ICD-10-CM

## 2013-03-14 DIAGNOSIS — E785 Hyperlipidemia, unspecified: Secondary | ICD-10-CM

## 2013-03-14 DIAGNOSIS — I10 Essential (primary) hypertension: Secondary | ICD-10-CM

## 2013-03-14 DIAGNOSIS — E559 Vitamin D deficiency, unspecified: Secondary | ICD-10-CM

## 2013-03-14 NOTE — Progress Notes (Signed)
Subjective:    Patient ID: [name], female    DOB: 03-06-1941, 72 y.o.   MRN: [id number]  HPI pt here for follow up of chronic medical problems. C/O bruising and chest tightness today. Patient stopped taking aspirin due to bruising  Review of Systems  Constitutional: Negative.   HENT: Negative.   Eyes: Negative.   Respiratory: Positive for chest tightness (OFF AND ON, causes arms to hurt at times) and wheezing.   Cardiovascular: Negative.   Gastrointestinal: Negative.   Endocrine: Negative.   Genitourinary: Negative.   Musculoskeletal: Negative.   Skin: Positive for color change (BRUISING OF SKIN).  Allergic/Immunologic: Negative.   Neurological: Negative.   Hematological: Negative.   Psychiatric/Behavioral: Negative.        Objective:   Physical Exam  Nursing note and vitals reviewed. Constitutional: She is oriented to person, place, and time. She appears well-developed and well-nourished.  HENT:  Right Ear: External ear normal.  Left Ear: External ear normal.  Nose: Nose normal.  Mouth/Throat: Oropharynx is clear and moist.  Eyes: Conjunctivae and EOM are normal. Pupils are equal, round, and reactive to light.  Neck: Normal range of motion. Neck supple. No JVD present.  Cardiovascular: Normal rate, regular rhythm and normal heart sounds.   72 per /min  Pulmonary/Chest: Effort normal and breath sounds normal.  Abdominal: Soft. Bowel sounds are normal.  Musculoskeletal: Normal range of motion.  Lymphadenopathy:    She has no cervical adenopathy.  Neurological: She is alert and oriented to person, place, and time. She has normal reflexes. No cranial nerve deficit.  Skin: Skin is warm and dry.  Psychiatric: She has a normal mood and affect. Her behavior is normal. Judgment and thought content normal.   BP 162/81  Pulse 59  Temp(Src) 97 F (36.1 C) (Oral)  Ht 5\' 3"  (1.6 m)  Wt 190 lb (86.183 kg)  BMI 33.67 kg/m2 EKG-bradicardia- t wave inversion V1-4- anterior  ischemia but unchanged from previous Chest x ray-  Preliminary reading by Ernestina Penna, MD--degenerative changes in thoracic spine, osteopenia, and slightly enlarged heart                                        Assessment & Plan:   1. Chest tightness   2. Essential hypertension, benign   3. Other and unspecified hyperlipidemia   4. Unspecified vitamin D deficiency   5. Depressive disorder, not elsewhere classified   6. Osteoarthrosis, unspecified whether generalized or localized, unspecified site   7. GERD (gastroesophageal reflux disease)   8. Fibromyalgia    Orders Placed This Encounter  Procedures  . DG Chest 2 View    Standing Status: Future     Number of Occurrences:      Standing Expiration Date: 05/14/2014    Order Specific Question:  Reason for Exam (SYMPTOM  OR DIAGNOSIS REQUIRED)    Answer:  chest tightness    Order Specific Question:  Preferred imaging location?    Answer:  Internal  . Ambulatory referral to Cardiology    Referral Priority:  Routine    Referral Type:  Consultation    Referral Reason:  Specialty Services Required    Requested Specialty:  Cardiology    Number of Visits Requested:  1  . EKG 12-Lead   Continue all meds Continue Lifestyle modification- diet and exercise Fall precautions discussed Keep follow up appointments with any specialists  RTO for flu shot Ask Dr. Maplewood Lions about stopping ASA Limit celebrex use- try to only use 1 per day  Nyra Capes MD

## 2013-03-14 NOTE — Patient Instructions (Addendum)
Continue all meds Continue Lifestyle modification- diet and exercise Fall precautions discussed Keep follow up appointments with any specialists Be sure to get flu shot in October

## 2013-03-27 ENCOUNTER — Ambulatory Visit (INDEPENDENT_AMBULATORY_CARE_PROVIDER_SITE_OTHER): Payer: Medicare Other

## 2013-03-27 DIAGNOSIS — Z23 Encounter for immunization: Secondary | ICD-10-CM

## 2013-04-03 ENCOUNTER — Other Ambulatory Visit: Payer: Self-pay | Admitting: Family Medicine

## 2013-04-09 ENCOUNTER — Other Ambulatory Visit: Payer: Self-pay | Admitting: Family Medicine

## 2013-04-11 NOTE — Telephone Encounter (Signed)
This is okay to refill 

## 2013-04-11 NOTE — Telephone Encounter (Signed)
Last seen 03/14/13, last filled 11/01/12 with 4 RF. Route to pool A if approved, so it can be called into [facility]

## 2013-04-12 NOTE — Telephone Encounter (Signed)
Called to pharm 

## 2013-04-15 ENCOUNTER — Other Ambulatory Visit: Payer: Self-pay | Admitting: Family Medicine

## 2013-05-02 ENCOUNTER — Other Ambulatory Visit: Payer: Self-pay

## 2013-05-02 DIAGNOSIS — Z1231 Encounter for screening mammogram for malignant neoplasm of breast: Secondary | ICD-10-CM

## 2013-05-08 ENCOUNTER — Encounter: Payer: Self-pay | Admitting: Cardiology

## 2013-05-08 ENCOUNTER — Encounter: Payer: Self-pay | Admitting: Family Medicine

## 2013-05-08 ENCOUNTER — Ambulatory Visit (INDEPENDENT_AMBULATORY_CARE_PROVIDER_SITE_OTHER): Payer: Medicare Other | Admitting: Family Medicine

## 2013-05-08 ENCOUNTER — Ambulatory Visit (INDEPENDENT_AMBULATORY_CARE_PROVIDER_SITE_OTHER): Payer: Medicare Other | Admitting: Cardiology

## 2013-05-08 VITALS — BP 136/80 | HR 65 | Ht 65.0 in | Wt 193.0 lb

## 2013-05-08 VITALS — BP 141/76 | HR 76 | Temp 97.4°F | Ht 65.0 in | Wt 190.6 lb

## 2013-05-08 DIAGNOSIS — I10 Essential (primary) hypertension: Secondary | ICD-10-CM

## 2013-05-08 DIAGNOSIS — I059 Rheumatic mitral valve disease, unspecified: Secondary | ICD-10-CM

## 2013-05-08 DIAGNOSIS — J40 Bronchitis, not specified as acute or chronic: Secondary | ICD-10-CM

## 2013-05-08 DIAGNOSIS — E538 Deficiency of other specified B group vitamins: Secondary | ICD-10-CM

## 2013-05-08 DIAGNOSIS — R072 Precordial pain: Secondary | ICD-10-CM

## 2013-05-08 DIAGNOSIS — M1990 Unspecified osteoarthritis, unspecified site: Secondary | ICD-10-CM

## 2013-05-08 MED ORDER — ETODOLAC 400 MG PO TABS: 400.0000 mg | ORAL_TABLET | Freq: Two times a day (BID) | ORAL | Status: DC

## 2013-05-08 MED ORDER — AMOXICILLIN 875 MG PO TABS: 875.0000 mg | ORAL_TABLET | Freq: Two times a day (BID) | ORAL | Status: DC

## 2013-05-08 NOTE — Progress Notes (Signed)
  Subjective:    Patient ID: [name], female    DOB: 1940/09/14, 72 y.o.   MRN: [id number]  HPI  This 72 y.o. female presents for evaluation of cough and bronchitis sx's for over a week.  Review of Systems No chest pain, SOB, HA, dizziness, vision change, N/V, diarrhea, constipation, dysuria, urinary urgency or frequency, myalgias, arthralgias or rash.     Objective:   Physical Exam  Vital signs noted  Well developed well nourished female.  HEENT - Head atraumatic Normocephalic                Eyes - PERRLA, Conjuctiva - clear Sclera- Clear EOMI                Ears - EAC's Wnl TM's Wnl Gross Hearing WNL                Nose - Nares patent                 Throat - oropharanx wnl Respiratory - Lungs CTA bilateral Cardiac - RRR S1 and S2 without murmur GI - Abdomen soft Nontender and bowel sounds active x 4 Extremities - No edema. Neuro - Grossly intact.      Assessment & Plan:  Bronchitis - Plan: amoxicillin (AMOXIL) 875 MG tablet  OA (osteoarthritis) - Plan: etodolac (LODINE) 400 MG tablet  Deatra Canter FNP

## 2013-05-08 NOTE — Patient Instructions (Signed)
Vitamin B12 Injections Every person needs vitamin B12. A deficiency develops when the body does not get enough of it. One way to overcome this is by getting B12 shots (injections). A B12 shot puts the vitamin directly into muscle tissue. This avoids any problems your body might have in absorbing it from food or a pill. In some people, the body has trouble using the vitamin correctly. This can cause a B12 deficiency. Not consuming enough of the vitamin can also cause a deficiency. Getting enough vitamin B12 can be hard for elderly people. Sometimes, they do not eat a well-balanced diet. The elderly are also more likely than younger people to have medical conditions or take medications that can lead to a deficiency. WHAT DOES VITAMIN B12 DO? Vitamin B12 does many things to help the body work right:  It helps the body make healthy red blood cells.  It helps maintain nerve cells.  It is involved in the body's process of converting food into energy (metabolism).  It is needed to make the genetic material in all cells (DNA). VITAMIN B12 FOOD SOURCES Most people get plenty of vitamin B12 through the foods they eat. It is present in:  Meat, fish, poultry, and eggs.  Milk and milk products.  It also is added when certain foods are made, including some breads, cereals and yogurts. The food is then called "fortified". CAUSES The most common causes of vitamin B12 deficiency are:  Pernicious anemia. The condition develops when the body cannot make enough healthy red blood cells. This stems from a lack of a protein made in the stomach (intrinsic factor). People without this protein cannot absorb enough vitamin B12 from food.  Malabsorption. This is when the body cannot absorb the vitamin. It can be caused by:  Pernicious anemia.  Surgery to remove part or all of the stomach can lead to malabsorption. Removal of part or all of the small intestine can also cause malabsorption.  Vegetarian diet.  People who are strict about not eating foods from animals could have trouble taking in enough vitamin B12 from diet alone.  Medications. Some medicines have been linked to B12 deficiency, such as Metformin (a drug prescribed for type 2 diabetes). Long-term use of stomach acid suppressants also can keep the vitamin from being absorbed.  Intestinal problems such as inflammatory bowel disease. If there are problems in the digestive tract, vitamin B12 may not be absorbed in good enough amounts. SYMPTOMS People who do not get enough B12 can develop problems. These can include:  Anemia. This is when the body has too few red blood cells. Red blood cells carry oxygen to the rest of the body. Without a healthy supply of red blood cells, people can feel:  Tired (fatigued).  Weak.  Severe anemia can cause:  Shortness of breath.  Dizziness.  Rapid heart rate.  Paleness.  Other Vitamin B12 deficiency symptoms include:  Diarrhea.  Numbness or tingling in the hands or feet.  Loss of appetite.  Confusion.  Sores on the tongue or in the mouth. LET YOUR CAREGIVER KNOW ABOUT:  Any allergies. It is very important to know if you are allergic or sensitive to cobalt. Vitamin B12 contains cobalt.  Any history of kidney disease.  All medications you are taking. Include prescription and over-the-counter medicines, herbs and creams.  Whether you are pregnant or breast-feeding.  If you have Leber's disease, a hereditary eye condition, vitamin B12 could make it worse. RISKS AND COMPLICATIONS Reactions to an injection are   usually temporary. They might include:  Pain at the injection site.  Redness, swelling or tenderness at the site.  Headache, dizziness or weakness.  Nausea, upset stomach or diarrhea.  Numbness or tingling.  Fever.  Joint pain.  Itching or rash. If a reaction does not go away in a short while, talk with your healthcare provider. A change in the way the shots are  given, or where they are given, might need to be made. BEFORE AN INJECTION To decide whether B12 injections are right for you, your healthcare provider will probably:  Ask about your medical history.  Ask questions about your diet.  Ask about symptoms such as:  Have you felt weak?  Do you feel unusually tired?  Do you get dizzy?  Order blood tests. These may include a test to:  Check the level of red cells in your blood.  Measure B12 levels.  Check for the presence of intrinsic factor. VITAMIN B12 INJECTIONS How often you will need a vitamin B12 injection will depend on how severe your deficiency is. This also will affect how long you will need to get them. People with pernicious anemia usually get injections for their entire life. Others might get them for a shorter period. For many people, injections are given daily or weekly for several weeks. Then, once B12 levels are normal, injections are given just once a month. If the cause of the deficiency can be fixed, the injections can be stopped. Talk with your healthcare provider about what you should expect. For an injection:  The injection site will be cleaned with an alcohol swab.  Your healthcare provider will insert a needle directly into a muscle. Most any muscle can be used. Most often, an arm muscle is used. A buttocks muscle can also be used. Many people say shots in that area are less painful.  A small adhesive bandage may be put over the injection site. It usually can be taken off in an hour or less. Injections can be given by your healthcare provider. In some cases, family members give them. Sometimes, people give them to themselves. Talk with your healthcare provider about what would be best for you. If someone other than your healthcare provider will be giving the shots, the person will need to be trained to give them correctly. HOME CARE INSTRUCTIONS   You can remove the adhesive bandage within an hour of getting a  shot.  You should be able to go about your normal activities right away.  Avoid drinking large amounts of alcohol while taking vitamin B12 shots. Alcohol can interfere with the body's use of the vitamin. SEEK MEDICAL CARE IF:   Pain, redness, swelling or tenderness at the injection site does not get better or gets worse.  Headache, dizziness or weakness does not go away.  You develop a fever of more than 100.5 F (38.1 C). SEEK IMMEDIATE MEDICAL CARE IF:   You have chest pain.  You develop shortness of breath.  You have muscle weakness that gets worse.  You develop numbness, weakness or tingling on one side or one area of the body.  You have symptoms of an allergic reaction, such as:  Hives.  Difficulty breathing.  Swelling of the lips, face, tongue or throat.  You develop a fever of more than 102.0 F (38.9 C). MAKE SURE YOU:   Understand these instructions.  Will watch your condition.  Will get help right away if you are not doing well or get worse. Document   Released: 09/09/2008 Document Revised: 09/05/2011 Document Reviewed: 09/09/2008 [employer] Patient Information 2014 [employer].  

## 2013-05-08 NOTE — Patient Instructions (Signed)
The current medical regimen is effective;  continue present plan and medications.  Your physician has requested that you have a lexiscan myoview. For further information please visit [email or web address]. Please follow instruction sheet, as given.  Further follow up will be based on these results. 

## 2013-05-08 NOTE — Progress Notes (Signed)
HPI The patient returns after a greater than three-year absence. I saw her in 2011 for chest pain. She had a negative stress perfusion study at that time. However, she's returning with chest pain now. This has been lasting for the last couple of weeks. It happens with exertion and with emotional stress. She's not describing it necessarily at rest though it may last at a low level or couple of hours after she gets it and stopped what she is doing. She describes a 3/10 midsternal discomfort with some associated nausea. She thinks her "breathing is harder". She does get diaphoretic but not necessarily with this. She does get some discomfort in her arms but not her jaw. She doesn't think this is what she had before. She does some activities but doesn't exercise routinely. She's not noticed any palpitations, presyncope or syncope. She is not describing PND or orthopnea.  Allergies  Allergen Reactions  . Alendronate Sodium Other (See Comments)    Reaction unknown  . Latex Other (See Comments)    RA to latex 1982   . Levofloxacin Other (See Comments)    Reaction unknown  . Lipitor [Atorvastatin Calcium] Other (See Comments)    Reaction unknown  . Lyrica [Pregabalin] Other (See Comments)    Reaction unknown  . Meloxicam     Vaginal itching   . Motrin [Ibuprofen] Other (See Comments)    Reaction unknown  . Sibutramine Hcl Monohydrate Other (See Comments)    Reaction unknown    Current Outpatient Prescriptions  Medication Sig Dispense Refill  . aspirin 81 MG tablet Take 81 mg by mouth daily.      Marland Kitchen atenolol (TENORMIN) 50 MG tablet TAKE 1 TABLET DAILY  90 tablet  1  . CELEBREX 200 MG capsule TAKE (1) CAPSULE DAILY  30 capsule  1  . Cholecalciferol (VITAMIN D3) 1000 UNITS CAPS Take 1 capsule by mouth daily.        . clorazepate (TRANXENE-T) 7.5 MG tablet One tablet daily  30 tablet  3  . fluticasone (FLONASE) 50 MCG/ACT nasal spray Place 2 sprays into the nose daily.  16 g  6  . gabapentin  (NEURONTIN) 300 MG capsule Take 300 mg by mouth daily.      . hydrocortisone (PROCTOZONE-HC) 2.5 % rectal cream Place 1 application rectally 2 (two) times daily as needed. For constipation  30 g  2  . Multiple Vitamins-Minerals (SENIOR MULTIVITAMIN PLUS PO) Take by mouth daily.        . Omeprazole Magnesium (PRILOSEC OTC PO) Take 1 capsule by mouth daily. For acid reflux      . simvastatin (ZOCOR) 80 MG tablet Take 1 tablet (80 mg total) by mouth at bedtime.  30 tablet  4  . Travoprost, BAK Free, (TRAVATAN) 0.004 % SOLN ophthalmic solution Place 1 drop into the right eye at bedtime.  5 mL  4  . venlafaxine XR (EFFEXOR-XR) 75 MG 24 hr capsule TAKE (1) CAPSULE DAILY  30 capsule  2  . Vitamin B1-B12 (NEURO B-12 IJ) Inject 1 vial as directed every 30 (thirty) days.       Marland Kitchen zolpidem (AMBIEN) 10 MG tablet TAKE 1 TABLET AT BEDTIME AS NEEDED FOR SLEEP  30 tablet  2   Current Facility-Administered Medications  Medication Dose Route Frequency Provider Last Rate Last Dose  . cyanocobalamin ((VITAMIN B-12)) injection 1,000 mcg  1,000 mcg Intramuscular Q30 days Ernestina Penna, MD   1,000 mcg at 03/05/13 0930    Past  Medical History  Diagnosis Date  . Helicobacter pylori (H. pylori)   . Hiatal hernia   . Other and unspecified hyperlipidemia   . Fibromyalgia   . Osteoarthritis   . Hyperlipidemia   . Hypertension   . ACL tear     right  Dr. Lowella Curb   . Glaucoma     (SE) Dr. Lovell Sheehan   . Arthritis   . Post-menopausal   . Depression   . Fibromyalgia   . B12 deficiency   . Tremor   . DUB (dysfunctional uterine bleeding) 10/96  . Insomnia   . Constipation   . ETD (eustachian tube dysfunction)   . MVP (mitral valve prolapse)   . Adenomatous polyp   . Osteopenia   . Cataracts, both eyes 10/2006  . Anxiety     Past Surgical History  Procedure Laterality Date  . Right knee replacement  3/11    Dr. Lowella Curb  . Rt. eye cataract  05/28/07  . Dilation and curettage of uterus  03/31/95    Dr.  Adron Bene   . Cholecystectomy  1979  . Ventral hernia repair  2/99  . Bunionectomy  08/1999    right - Dr. Ulice Brilliant   . Orif humerus fracture  05/30/2011    Procedure: OPEN REDUCTION INTERNAL FIXATION (ORIF) PROXIMAL HUMERUS FRACTURE;  Surgeon: Verlee Rossetti;  Location: [facility] OR;  Service: Orthopedics;  Laterality: Left;  open reduction internal fixation of proximal humerus fracture    Family History  Problem Relation Age of Onset  . Cancer Mother     PANCREATIC   . Heart attack Father   . Hypertension Sister   . Hypertension Sister     History   Social History  . Marital Status: Married    Spouse Name: N/A    Number of Children: N/A  . Years of Education: N/A   Occupational History  . Not on file.   Social History Main Topics  . Smoking status: Never Smoker   . Smokeless tobacco: Not on file  . Alcohol Use: No  . Drug Use: No  . Sexual Activity: Not on file   Other Topics Concern  . Not on file   Social History Narrative  . No narrative on file    ROS:  As stated in the HPI and negative for all other systems.  PHYSICAL EXAM BP 136/80  Pulse 65  Ht 5\' 5"  (1.651 m)  Wt 193 lb (87.544 kg)  BMI 32.12 kg/m2 GENERAL:  Well appearing HEENT:  Pupils equal round and reactive, fundi not visualized, oral mucosa unremarkable NECK:  No jugular venous distention, waveform within normal limits, carotid upstroke brisk and symmetric, no bruits, no thyromegaly LYMPHATICS:  No cervical, inguinal adenopathy LUNGS:  Clear to auscultation bilaterally BACK:  No CVA tenderness CHEST:  Unremarkable HEART:  PMI not displaced or sustained,S1 and S2 within normal limits, no S3, no S4, no clicks, no  murmurs ABD:  Flat, positive bowel sounds normal in frequency in pitch, no bruits, no rebound, no guarding, no midline pulsatile mass, no hepatomegaly, no splenomegaly EXT:  2 plus pulses throughout, no edema, no cyanosis no clubbing SKIN:  No rashes no nodules NEURO:  Cranial nerves II through  XII grossly intact, motor grossly intact throughout PSYCH:  Cognitively intact, oriented to person place and time   EKG:  Sinus bradycardia, rate 58, axis within normal limits, intervals within normal limits, no acute ST-T wave changes.  03/14/13  ASSESSMENT AND PLAN  CHEST PAIN:  Her pain has some typical and some atypical features. I will screen her with an exercise treadmill test. However, she could barely finish the test 3 years ago and doesn't think she would be about due now because of dyspnea and leg tiredness. Therefore, she will have a [employer].Further evaluation will be based on these results.  HTN:  The blood pressure is at target. No change in medications is indicated. We will continue with therapeutic lifestyle changes (TLC).  MVP:  She reports having had this diagnosis before. However, I don't hear a murmur or cruciate prolapsing clicks. No change in therapy is indicated.    MURMUR:  I suspect mild aortic sclerosis. This can be followed clinically.

## 2013-05-08 NOTE — Progress Notes (Signed)
Tolerated b12 injection rt deltoid without difficulty

## 2013-05-13 ENCOUNTER — Encounter: Payer: Self-pay | Admitting: Cardiology

## 2013-05-21 ENCOUNTER — Encounter: Payer: Self-pay | Admitting: Cardiology

## 2013-05-21 ENCOUNTER — Ambulatory Visit (HOSPITAL_COMMUNITY): Payer: Medicare Other | Attending: Cardiology | Admitting: Radiology

## 2013-05-21 VITALS — BP 184/82 | HR 67 | Ht 65.0 in | Wt 188.0 lb

## 2013-05-21 DIAGNOSIS — R079 Chest pain, unspecified: Secondary | ICD-10-CM

## 2013-05-21 DIAGNOSIS — R0602 Shortness of breath: Secondary | ICD-10-CM | POA: Insufficient documentation

## 2013-05-21 DIAGNOSIS — R072 Precordial pain: Secondary | ICD-10-CM

## 2013-05-21 DIAGNOSIS — R002 Palpitations: Secondary | ICD-10-CM | POA: Insufficient documentation

## 2013-05-21 MED ORDER — REGADENOSON 0.4 MG/5ML IV SOLN
0.4000 mg | Freq: Once | INTRAVENOUS | Status: AC
  Administered 2013-05-21: 0.4 mg via INTRAVENOUS

## 2013-05-21 MED ORDER — TECHNETIUM TC 99M SESTAMIBI GENERIC - CARDIOLITE
30.0000 | Freq: Once | INTRAVENOUS | Status: AC | PRN
  Administered 2013-05-21: 30 via INTRAVENOUS

## 2013-05-21 MED ORDER — TECHNETIUM TC 99M SESTAMIBI GENERIC - CARDIOLITE
10.0000 | Freq: Once | INTRAVENOUS | Status: AC | PRN
  Administered 2013-05-21: 10 via INTRAVENOUS

## 2013-05-21 NOTE — Progress Notes (Signed)
[facility] SITE 3 NUCLEAR MED [address] [place], [place] [address] [phone]    Cardiology Nuclear Med Study  [name] is a 72 y.o. female     MRN : [id number]     DOB: July 10, 1940  Procedure Date: 05/21/2013  Nuclear Med Background Indication for Stress Test:  Evaluation for Ischemia History:  No known CAD, MPI 2011 (normal) EF 72% Cardiac Risk Factors: Family History - CAD, Hypertension and Lipids  Symptoms:  Chest Pain with Exertion, Palpitations and SOB   Nuclear Pre-Procedure Caffeine/Decaff Intake:  11:00pm NPO After: 11:00pm   Lungs:  clear O2 Sat: 98% on room air. IV 0.9% NS with Angio Cath:  22g  IV Site: R Hand  IV Started by:  Cathlyn Parsons, RN  Chest Size (in):  40 Cup Size: C  Height: 5\' 5"  (1.651 m)  Weight:  188 lb (85.276 kg)  BMI:  Body mass index is 31.28 kg/(m^2). Tech Comments:  No Atenolol x 24 hrs    Nuclear Med Study 1 or 2 day study: 1 day  Stress Test Type:  Treadmill/Lexiscan  Reading MD: Marca Ancona, MD  Order Authorizing Provider:  Melany Guernsey  Resting Radionuclide: Technetium 40m Sestamibi  Resting Radionuclide Dose: 11.0 mCi   Stress Radionuclide:  Technetium 33m Sestamibi  Stress Radionuclide Dose: 32.8 mCi           Stress Protocol Rest HR: 67 Stress HR: 133  Rest BP: 184/82 Stress BP: 196/108  Exercise Time (min): n/a METS: n/a   Predicted Max HR: 150 bpm % Max HR: 88.67 bpm Rate Pressure Product: 21308   Dose of Adenosine (mg):  n/a Dose of Lexiscan: 0.4 mg  Dose of Atropine (mg): n/a Dose of Dobutamine: n/a mcg/kg/min (at max HR)  Stress Test Technologist: Nelson Chimes, BS-ES  Nuclear Technologist:  Domenic Polite, CNMT     Rest Procedure:  Myocardial perfusion imaging was performed at rest 45 minutes following the intravenous administration of Technetium 68m Sestamibi. Rest ECG: NSR-RBBB  Stress Procedure:  The patient received IV Lexiscan 0.4 mg over 15-seconds with concurrent low  level exercise and then Technetium 5m Sestamibi was injected at 30-seconds while the patient continued walking one more minute.  Quantitative spect images were obtained after a 45-minute delay.  During the infusion of Lexiscan, the patient stated she had a tired feeling.  Symptoms resolved in recovery.  Stress ECG: No significant change from baseline ECG  QPS Raw Data Images:  Normal; no motion artifact; normal heart/lung ratio. Stress Images:  Small, mild apical septal perfusion defect.  Rest Images:  Small, mild apical septal perfusion defect, less marked than with stress.  Subtraction (SDS):  Small, mild partially reversible apical septal perfusion defect. Transient Ischemic Dilatation (Normal <1.22):  1.03 Lung/Heart Ratio (Normal <0.45):  0.42  Quantitative Gated Spect Images QGS EDV:  80 ml QGS ESV:  23 ml  Impression Exercise Capacity:  Lexiscan with low level exercise. BP Response:  Hypertensive blood pressure response. Clinical Symptoms:  Fatigue.  ECG Impression:  RBBB, no change with infusion.  Comparison with Prior Nuclear Study: The apical septal perfusion defect is new.   Overall Impression:  Low risk stress nuclear study with a small, mild partially reversible apical septal perfusion defect.  This could be breast attenuation, but there is clearly some reversibility so cannot rule out a small area of ischemia. .  LV Ejection Fraction: 71%.  LV Wall Motion:  NL LV Function; NL Wall Motion  [name] 05/21/2013

## 2013-05-30 ENCOUNTER — Telehealth: Payer: Self-pay | Admitting: Cardiology

## 2013-05-30 NOTE — Telephone Encounter (Signed)
New message ° ° ° ° °Want stress test results °

## 2013-05-30 NOTE — Telephone Encounter (Signed)
Results reviewed with pt who states understanding

## 2013-06-06 ENCOUNTER — Ambulatory Visit: Payer: Medicare Other

## 2013-06-07 ENCOUNTER — Ambulatory Visit
Admission: RE | Admit: 2013-06-07 | Discharge: 2013-06-07 | Disposition: A | Discharge status: Home / Self Care | Payer: Medicare Other | Source: Ambulatory Visit

## 2013-06-07 DIAGNOSIS — Z1231 Encounter for screening mammogram for malignant neoplasm of breast: Secondary | ICD-10-CM

## 2013-06-11 ENCOUNTER — Ambulatory Visit (INDEPENDENT_AMBULATORY_CARE_PROVIDER_SITE_OTHER): Payer: Medicare Other | Admitting: *Deleted

## 2013-06-11 DIAGNOSIS — E538 Deficiency of other specified B group vitamins: Secondary | ICD-10-CM

## 2013-06-14 ENCOUNTER — Other Ambulatory Visit (INDEPENDENT_AMBULATORY_CARE_PROVIDER_SITE_OTHER): Payer: Medicare Other

## 2013-06-14 DIAGNOSIS — Z1212 Encounter for screening for malignant neoplasm of rectum: Secondary | ICD-10-CM

## 2013-06-14 NOTE — Progress Notes (Signed)
Patient dropped off fobt 

## 2013-06-15 LAB — FECAL OCCULT BLOOD, IMMUNOCHEMICAL: Fecal Occult Bld: NEGATIVE

## 2013-07-03 ENCOUNTER — Other Ambulatory Visit (INDEPENDENT_AMBULATORY_CARE_PROVIDER_SITE_OTHER): Payer: Medicare Other

## 2013-07-03 DIAGNOSIS — R5383 Other fatigue: Principal | ICD-10-CM

## 2013-07-03 DIAGNOSIS — E785 Hyperlipidemia, unspecified: Secondary | ICD-10-CM

## 2013-07-03 DIAGNOSIS — I10 Essential (primary) hypertension: Secondary | ICD-10-CM

## 2013-07-03 DIAGNOSIS — R5381 Other malaise: Secondary | ICD-10-CM

## 2013-07-03 DIAGNOSIS — E559 Vitamin D deficiency, unspecified: Secondary | ICD-10-CM

## 2013-07-03 LAB — POCT CBC
GRANULOCYTE PERCENT: 67.4 % (ref 37–80)
HEMATOCRIT: 35.1 % — AB (ref 37.7–47.9)
HEMOGLOBIN: 11.1 g/dL — AB (ref 12.2–16.2)
Lymph, poc: 1.6 (ref 0.6–3.4)
MCH, POC: 25.8 pg — AB (ref 27–31.2)
MCHC: 31.5 g/dL — AB (ref 31.8–35.4)
MCV: 82 fL (ref 80–97)
MPV: 8.8 fL (ref 0–99.8)
POC Granulocyte: 3.4 (ref 2–6.9)
POC LYMPH PERCENT: 30.5 %L (ref 10–50)
Platelet Count, POC: 233 10*3/uL (ref 142–424)
RBC: 4.3 M/uL (ref 4.04–5.48)
RDW, POC: 14.1 %
WBC: 5.1 10*3/uL (ref 4.6–10.2)

## 2013-07-03 NOTE — Progress Notes (Signed)
Patient came in for labs only.

## 2013-07-04 LAB — NMR, LIPOPROFILE
CHOLESTEROL: 175 mg/dL (ref <200)
HDL Cholesterol by NMR: 67 mg/dL (ref >=40)
HDL PARTICLE NUMBER: 44.8 umol/L (ref >=30.5)
LDL Particle Number: 1222 nmol/L — ABNORMAL HIGH (ref <1000)
LDL SIZE: 20.9 nm (ref >20.5)
LDLC SERPL CALC-MCNC: 92 mg/dL (ref <100)
SMALL LDL PARTICLE NUMBER: 433 nmol/L (ref <=527)
Triglycerides by NMR: 79 mg/dL (ref <150)

## 2013-07-04 LAB — BMP8+EGFR
BUN/Creatinine Ratio: 23 (ref 11–26)
BUN: 14 mg/dL (ref 8–27)
CO2: 28 mmol/L (ref 18–29)
Calcium: 9 mg/dL (ref 8.6–10.2)
Chloride: 102 mmol/L (ref 97–108)
Creatinine, Ser: 0.6 mg/dL (ref 0.57–1.00)
GFR, EST AFRICAN AMERICAN: 106 mL/min/{1.73_m2} (ref >59)
GFR, EST NON AFRICAN AMERICAN: 92 mL/min/{1.73_m2} (ref >59)
GLUCOSE: 102 mg/dL — AB (ref 65–99)
Potassium: 4.1 mmol/L (ref 3.5–5.2)
Sodium: 144 mmol/L (ref 134–144)

## 2013-07-04 LAB — VITAMIN D 25 HYDROXY (VIT D DEFICIENCY, FRACTURES): VIT D 25 HYDROXY: 48.3 ng/mL (ref 30.0–100.0)

## 2013-07-04 LAB — HEPATIC FUNCTION PANEL
ALBUMIN: 4.2 g/dL (ref 3.5–4.8)
ALK PHOS: 107 IU/L (ref 39–117)
ALT: 7 IU/L (ref 0–32)
AST: 17 IU/L (ref 0–40)
BILIRUBIN DIRECT: 0.11 mg/dL (ref 0.00–0.40)
BILIRUBIN TOTAL: 0.3 mg/dL (ref 0.0–1.2)
TOTAL PROTEIN: 6.2 g/dL (ref 6.0–8.5)

## 2013-07-05 ENCOUNTER — Telehealth: Payer: Self-pay | Admitting: Family Medicine

## 2013-07-08 ENCOUNTER — Encounter: Payer: Self-pay | Admitting: *Deleted

## 2013-07-08 NOTE — Progress Notes (Signed)
Quick Note:  Copy of labs sent to patient ______ 

## 2013-07-11 ENCOUNTER — Ambulatory Visit (INDEPENDENT_AMBULATORY_CARE_PROVIDER_SITE_OTHER): Payer: Medicare Other | Admitting: Family Medicine

## 2013-07-11 ENCOUNTER — Encounter: Payer: Self-pay | Admitting: Family Medicine

## 2013-07-11 VITALS — BP 134/69 | HR 83 | Temp 97.3°F | Ht 65.0 in | Wt 190.0 lb

## 2013-07-11 DIAGNOSIS — F3289 Other specified depressive episodes: Secondary | ICD-10-CM

## 2013-07-11 DIAGNOSIS — F329 Major depressive disorder, single episode, unspecified: Secondary | ICD-10-CM

## 2013-07-11 DIAGNOSIS — M1990 Unspecified osteoarthritis, unspecified site: Secondary | ICD-10-CM

## 2013-07-11 DIAGNOSIS — E785 Hyperlipidemia, unspecified: Secondary | ICD-10-CM

## 2013-07-11 DIAGNOSIS — I10 Essential (primary) hypertension: Secondary | ICD-10-CM

## 2013-07-11 DIAGNOSIS — IMO0001 Reserved for inherently not codable concepts without codable children: Secondary | ICD-10-CM

## 2013-07-11 NOTE — Progress Notes (Signed)
Pt notified at appt [facility] understanding

## 2013-07-11 NOTE — Telephone Encounter (Signed)
Pt notified of labs at appt 

## 2013-07-11 NOTE — Progress Notes (Signed)
Subjective:    Patient ID: [name], female    DOB: 1940/11/16, 73 y.o.   MRN: [id number]  HPI Pt here for follow up and management of chronic medical problems. She does complain of a lot of arthralgias and some swelling. The arthralgias are and this sternoclavicular joints and around to the back of her neck. She complains of some swelling stiffness and both wrists and both feet.   Review of Systems  Constitutional: Positive for fatigue (occasional).  HENT: Positive for voice change (intermitent).   Eyes: Negative.  Negative for redness and itching.  Respiratory: Positive for shortness of breath.   Cardiovascular: Negative.  Negative for chest pain and palpitations.  Gastrointestinal: Negative.  Negative for abdominal pain, diarrhea and constipation.  Endocrine: Negative.   Genitourinary: Positive for vaginal pain. Negative for dysuria and vaginal bleeding.  Musculoskeletal: Positive for arthralgias (upper chest, bilateral arms).  Skin: Negative.  Negative for color change and rash.  Allergic/Immunologic: Negative.   Neurological: Negative for dizziness, tremors and weakness.  Psychiatric/Behavioral: Positive for sleep disturbance (stable with med). Negative for confusion.       Objective:   Physical Exam  Nursing note and vitals reviewed. Constitutional: She is oriented to person, place, and time. She appears well-developed and well-nourished. She appears distressed ( Somewhat distressed and anxious, but this is not unusual for her. She worries about her health issues).  HENT:  Head: Normocephalic and atraumatic.  Right Ear: External ear normal.  Left Ear: External ear normal.  Nose: Nose normal.  Mouth/Throat: Oropharynx is clear and moist. No oropharyngeal exudate.  Eyes: Conjunctivae and EOM are normal. Pupils are equal, round, and reactive to light. Right eye exhibits no discharge. Left eye exhibits no discharge. No scleral icterus.  Neck: Normal range of motion. Neck  supple. No JVD present. No thyromegaly present.  No carotid bruits  Cardiovascular: Normal rate, regular rhythm, normal heart sounds and intact distal pulses.  Exam reveals no gallop and no friction rub.   No murmur heard. At 72 per minute  Pulmonary/Chest: Effort normal and breath sounds normal. No respiratory distress. She has no wheezes. She has no rales. She exhibits tenderness.  Abdominal: Soft. Bowel sounds are normal. She exhibits no mass. There is no tenderness. There is no rebound and no guarding.  Scar tissue from previous surgery  Musculoskeletal: Normal range of motion. She exhibits no edema and no tenderness.  There is no significant edema or swelling in upper or lower extremities.  Lymphadenopathy:    She has no cervical adenopathy.  Neurological: She is alert and oriented to person, place, and time. She has normal reflexes. No cranial nerve deficit.  Skin: Skin is warm and dry.  Psychiatric: She has a normal mood and affect. Her behavior is normal. Judgment and thought content normal.  Increased anxiety   BP 134/69  Pulse 83  Temp(Src) 97.3 F (36.3 C) (Oral)  Ht 5\' 5"  (1.651 m)  Wt 190 lb (86.183 kg)  BMI 31.62 kg/m2  Recent lab work was reviewed with patient in the office today     Assessment & Plan:   1. OSTEOARTHRITIS  2. HYPERTENSION  3. HYPERLIPIDEMIA  4. FIBROMYALGIA  5. DEPRESSION  Patient Instructions  Continue current medications. Continue good therapeutic lifestyle changes which include good diet and exercise. Fall precautions discussed with patient. Schedule your flu vaccine if you haven't had it yet If you are over 57 years old - you may need Prevnar 26 or the adult  Pneumonia vaccine. Continue to drink plenty of fluid Use the ibuprofen, but be sure and take after eating Continue to use your copper gloves if they work Check with your insurance regarding the Prevnar vaccine       Arrie Senate MD

## 2013-07-11 NOTE — Patient Instructions (Addendum)
Continue current medications. Continue good therapeutic lifestyle changes which include good diet and exercise. Fall precautions discussed with patient. Schedule your flu vaccine if you haven't had it yet If you are over 72 years old - you may need Prevnar 60 or the adult Pneumonia vaccine. Continue to drink plenty of fluid Use the ibuprofen, but be sure and take after eating Continue to use your copper gloves if they work Check with your insurance regarding the Prevnar vaccine

## 2013-07-16 ENCOUNTER — Other Ambulatory Visit: Payer: Self-pay | Admitting: Family Medicine

## 2013-07-18 NOTE — Telephone Encounter (Signed)
This is okay to refill 

## 2013-07-24 ENCOUNTER — Ambulatory Visit (INDEPENDENT_AMBULATORY_CARE_PROVIDER_SITE_OTHER): Payer: Medicare Other | Admitting: *Deleted

## 2013-07-24 DIAGNOSIS — E538 Deficiency of other specified B group vitamins: Secondary | ICD-10-CM

## 2013-08-16 ENCOUNTER — Other Ambulatory Visit: Payer: Self-pay | Admitting: Family Medicine

## 2013-09-06 ENCOUNTER — Ambulatory Visit (INDEPENDENT_AMBULATORY_CARE_PROVIDER_SITE_OTHER): Payer: Medicare Other | Admitting: *Deleted

## 2013-09-06 DIAGNOSIS — E538 Deficiency of other specified B group vitamins: Secondary | ICD-10-CM

## 2013-09-06 NOTE — Patient Instructions (Signed)
Vitamin B12 Injections Every person needs vitamin B12. A deficiency develops when the body does not get enough of it. One way to overcome this is by getting B12 shots (injections). A B12 shot puts the vitamin directly into muscle tissue. This avoids any problems your body might have in absorbing it from food or a pill. In some people, the body has trouble using the vitamin correctly. This can cause a B12 deficiency. Not consuming enough of the vitamin can also cause a deficiency. Getting enough vitamin B12 can be hard for elderly people. Sometimes, they do not eat a well-balanced diet. The elderly are also more likely than younger people to have medical conditions or take medications that can lead to a deficiency. WHAT DOES VITAMIN B12 DO? Vitamin B12 does many things to help the body work right:  It helps the body make healthy red blood cells.  It helps maintain nerve cells.  It is involved in the body's process of converting food into energy (metabolism).  It is needed to make the genetic material in all cells (DNA). VITAMIN B12 FOOD SOURCES Most people get plenty of vitamin B12 through the foods they eat. It is present in:  Meat, fish, poultry, and eggs.  Milk and milk products.  It also is added when certain foods are made, including some breads, cereals and yogurts. The food is then called "fortified". CAUSES The most common causes of vitamin B12 deficiency are:  Pernicious anemia. The condition develops when the body cannot make enough healthy red blood cells. This stems from a lack of a protein made in the stomach (intrinsic factor). People without this protein cannot absorb enough vitamin B12 from food.  Malabsorption. This is when the body cannot absorb the vitamin. It can be caused by:  Pernicious anemia.  Surgery to remove part or all of the stomach can lead to malabsorption. Removal of part or all of the small intestine can also cause malabsorption.  Vegetarian diet.  People who are strict about not eating foods from animals could have trouble taking in enough vitamin B12 from diet alone.  Medications. Some medicines have been linked to B12 deficiency, such as Metformin (a drug prescribed for type 2 diabetes). Long-term use of stomach acid suppressants also can keep the vitamin from being absorbed.  Intestinal problems such as inflammatory bowel disease. If there are problems in the digestive tract, vitamin B12 may not be absorbed in good enough amounts. SYMPTOMS People who do not get enough B12 can develop problems. These can include:  Anemia. This is when the body has too few red blood cells. Red blood cells carry oxygen to the rest of the body. Without a healthy supply of red blood cells, people can feel:  Tired (fatigued).  Weak.  Severe anemia can cause:  Shortness of breath.  Dizziness.  Rapid heart rate.  Paleness.  Other Vitamin B12 deficiency symptoms include:  Diarrhea.  Numbness or tingling in the hands or feet.  Loss of appetite.  Confusion.  Sores on the tongue or in the mouth. LET YOUR CAREGIVER KNOW ABOUT:  Any allergies. It is very important to know if you are allergic or sensitive to cobalt. Vitamin B12 contains cobalt.  Any history of kidney disease.  All medications you are taking. Include prescription and over-the-counter medicines, herbs and creams.  Whether you are pregnant or breast-feeding.  If you have Leber's disease, a hereditary eye condition, vitamin B12 could make it worse. RISKS AND COMPLICATIONS Reactions to an injection are   usually temporary. They might include:  Pain at the injection site.  Redness, swelling or tenderness at the site.  Headache, dizziness or weakness.  Nausea, upset stomach or diarrhea.  Numbness or tingling.  Fever.  Joint pain.  Itching or rash. If a reaction does not go away in a short while, talk with your healthcare provider. A change in the way the shots are  given, or where they are given, might need to be made. BEFORE AN INJECTION To decide whether B12 injections are right for you, your healthcare provider will probably:  Ask about your medical history.  Ask questions about your diet.  Ask about symptoms such as:  Have you felt weak?  Do you feel unusually tired?  Do you get dizzy?  Order blood tests. These may include a test to:  Check the level of red cells in your blood.  Measure B12 levels.  Check for the presence of intrinsic factor. VITAMIN B12 INJECTIONS How often you will need a vitamin B12 injection will depend on how severe your deficiency is. This also will affect how long you will need to get them. People with pernicious anemia usually get injections for their entire life. Others might get them for a shorter period. For many people, injections are given daily or weekly for several weeks. Then, once B12 levels are normal, injections are given just once a month. If the cause of the deficiency can be fixed, the injections can be stopped. Talk with your healthcare provider about what you should expect. For an injection:  The injection site will be cleaned with an alcohol swab.  Your healthcare provider will insert a needle directly into a muscle. Most any muscle can be used. Most often, an arm muscle is used. A buttocks muscle can also be used. Many people say shots in that area are less painful.  A small adhesive bandage may be put over the injection site. It usually can be taken off in an hour or less. Injections can be given by your healthcare provider. In some cases, family members give them. Sometimes, people give them to themselves. Talk with your healthcare provider about what would be best for you. If someone other than your healthcare provider will be giving the shots, the person will need to be trained to give them correctly. HOME CARE INSTRUCTIONS   You can remove the adhesive bandage within an hour of getting a  shot.  You should be able to go about your normal activities right away.  Avoid drinking large amounts of alcohol while taking vitamin B12 shots. Alcohol can interfere with the body's use of the vitamin. SEEK MEDICAL CARE IF:   Pain, redness, swelling or tenderness at the injection site does not get better or gets worse.  Headache, dizziness or weakness does not go away.  You develop a fever of more than 100.5 F (38.1 C). SEEK IMMEDIATE MEDICAL CARE IF:   You have chest pain.  You develop shortness of breath.  You have muscle weakness that gets worse.  You develop numbness, weakness or tingling on one side or one area of the body.  You have symptoms of an allergic reaction, such as:  Hives.  Difficulty breathing.  Swelling of the lips, face, tongue or throat.  You develop a fever of more than 102.0 F (38.9 C). MAKE SURE YOU:   Understand these instructions.  Will watch your condition.  Will get help right away if you are not doing well or get worse. Document   Released: 09/09/2008 Document Revised: 09/05/2011 Document Reviewed: 09/09/2008 [employer] Patient Information 2014 [employer].  

## 2013-09-06 NOTE — Progress Notes (Signed)
b12 given and tolerated well 

## 2013-09-09 ENCOUNTER — Other Ambulatory Visit: Payer: Self-pay | Admitting: Family Medicine

## 2013-09-10 NOTE — Telephone Encounter (Signed)
Refilled ambien to [place] per [facility]

## 2013-10-04 ENCOUNTER — Other Ambulatory Visit: Payer: Self-pay | Admitting: Family Medicine

## 2013-10-16 ENCOUNTER — Encounter: Payer: Self-pay | Admitting: *Deleted

## 2013-10-22 ENCOUNTER — Ambulatory Visit (INDEPENDENT_AMBULATORY_CARE_PROVIDER_SITE_OTHER): Payer: Medicare Other | Admitting: *Deleted

## 2013-10-22 DIAGNOSIS — E538 Deficiency of other specified B group vitamins: Secondary | ICD-10-CM

## 2013-10-29 ENCOUNTER — Other Ambulatory Visit (INDEPENDENT_AMBULATORY_CARE_PROVIDER_SITE_OTHER): Payer: Medicare Other

## 2013-10-29 DIAGNOSIS — E785 Hyperlipidemia, unspecified: Secondary | ICD-10-CM

## 2013-10-29 DIAGNOSIS — R5383 Other fatigue: Secondary | ICD-10-CM

## 2013-10-29 DIAGNOSIS — E559 Vitamin D deficiency, unspecified: Secondary | ICD-10-CM

## 2013-10-29 DIAGNOSIS — I10 Essential (primary) hypertension: Secondary | ICD-10-CM

## 2013-10-29 DIAGNOSIS — R5381 Other malaise: Secondary | ICD-10-CM

## 2013-10-29 NOTE — Progress Notes (Signed)
Pt came in for lab onlty

## 2013-10-29 NOTE — Addendum Note (Signed)
Addended by: Pollyann Kennedy F on: 10/29/2013 02:36 PM   Modules accepted: Orders

## 2013-10-30 LAB — BMP8+EGFR
BUN/Creatinine Ratio: 20 (ref 11–26)
BUN: 15 mg/dL (ref 8–27)
CALCIUM: 9.5 mg/dL (ref 8.7–10.3)
CHLORIDE: 100 mmol/L (ref 97–108)
CO2: 27 mmol/L (ref 18–29)
Creatinine, Ser: 0.76 mg/dL (ref 0.57–1.00)
GFR calc Af Amer: 91 mL/min/{1.73_m2} (ref >59)
GFR calc non Af Amer: 79 mL/min/{1.73_m2} (ref >59)
Glucose: 101 mg/dL — ABNORMAL HIGH (ref 65–99)
POTASSIUM: 4.1 mmol/L (ref 3.5–5.2)
Sodium: 142 mmol/L (ref 134–144)

## 2013-10-30 LAB — CBC WITH DIFFERENTIAL
BASOS: 0 %
Basophils Absolute: 0 10*3/uL (ref 0.0–0.2)
Eos: 1 %
Eosinophils Absolute: 0.1 10*3/uL (ref 0.0–0.4)
HEMATOCRIT: 36 % (ref 34.0–46.6)
HEMOGLOBIN: 12.2 g/dL (ref 11.1–15.9)
Immature Grans (Abs): 0 10*3/uL (ref 0.0–0.1)
Immature Granulocytes: 0 %
Lymphocytes Absolute: 1.8 10*3/uL (ref 0.7–3.1)
Lymphs: 29 %
MCH: 27.7 pg (ref 26.6–33.0)
MCHC: 33.9 g/dL (ref 31.5–35.7)
MCV: 82 fL (ref 79–97)
MONOS ABS: 0.6 10*3/uL (ref 0.1–0.9)
Monocytes: 9 %
NEUTROS ABS: 3.8 10*3/uL (ref 1.4–7.0)
Neutrophils Relative %: 61 %
Platelets: 313 10*3/uL (ref 150–379)
RBC: 4.4 x10E6/uL (ref 3.77–5.28)
RDW: 15.1 % (ref 12.3–15.4)
WBC: 6.3 10*3/uL (ref 3.4–10.8)

## 2013-10-30 LAB — NMR, LIPOPROFILE
CHOLESTEROL: 189 mg/dL (ref <200)
HDL CHOLESTEROL BY NMR: 69 mg/dL (ref >=40)
HDL PARTICLE NUMBER: 49.4 umol/L (ref >=30.5)
LDL PARTICLE NUMBER: 1287 nmol/L — AB (ref <1000)
LDL Size: 20.7 nm (ref >20.5)
LDLC SERPL CALC-MCNC: 105 mg/dL — ABNORMAL HIGH (ref <100)
LP-IR Score: 39 (ref <=45)
Small LDL Particle Number: 495 nmol/L (ref <=527)
TRIGLYCERIDES BY NMR: 74 mg/dL (ref <150)

## 2013-10-30 LAB — HEPATIC FUNCTION PANEL
ALBUMIN: 4.4 g/dL (ref 3.5–4.8)
ALT: 15 IU/L (ref 0–32)
AST: 21 IU/L (ref 0–40)
Alkaline Phosphatase: 98 IU/L (ref 39–117)
Bilirubin, Direct: 0.17 mg/dL (ref 0.00–0.40)
TOTAL PROTEIN: 6.4 g/dL (ref 6.0–8.5)
Total Bilirubin: 0.6 mg/dL (ref 0.0–1.2)

## 2013-10-30 LAB — VITAMIN D 25 HYDROXY (VIT D DEFICIENCY, FRACTURES): Vit D, 25-Hydroxy: 60.3 ng/mL (ref 30.0–100.0)

## 2013-11-01 ENCOUNTER — Other Ambulatory Visit: Payer: Self-pay | Admitting: Family Medicine

## 2013-11-04 ENCOUNTER — Telehealth: Payer: Self-pay | Admitting: Family Medicine

## 2013-11-04 NOTE — Telephone Encounter (Signed)
Message copied by Waverly Ferrari on Mon Nov 04, 2013  9:22 AM ------      Message from: Chipper Herb      Created: Wed Oct 30, 2013  7:32 AM       The CBC has a normal white blood cell count. The hemoglobin is good 0.2. This is actually improved from past readings. The platelet count is adequate.      On the BMP, the blood sugar is slightly elevated at 101. The creatinine, the most important kidney function test is within normal limits. The electrolytes including potassium are within normal limits.      All liver function tests are within normal limit      Was advanced lipid testing the total LDL particle number remains elevated at 1287. The LDL C. is elevated at 105. The triglycerides are good. The good cholesterol or the HDL particle number is very good.------- continue simvastatin but try to make more effort with aggressive therapeutic lifestyle changes which include diet and exercise. Just more walking every day , less carbohydrates in the diet,  Less fried foods and more water      The vitamin D level is good at 60.3, continue current treatment ------

## 2013-11-11 ENCOUNTER — Other Ambulatory Visit: Payer: Self-pay | Admitting: Family Medicine

## 2013-11-13 ENCOUNTER — Ambulatory Visit: Payer: Medicare Other | Admitting: Family Medicine

## 2013-11-15 ENCOUNTER — Encounter: Payer: Self-pay | Admitting: Family Medicine

## 2013-11-15 ENCOUNTER — Ambulatory Visit (INDEPENDENT_AMBULATORY_CARE_PROVIDER_SITE_OTHER): Payer: Medicare Other | Admitting: Family Medicine

## 2013-11-15 VITALS — BP 139/69 | HR 63 | Temp 97.9°F | Ht 65.0 in | Wt 187.0 lb

## 2013-11-15 DIAGNOSIS — Z23 Encounter for immunization: Secondary | ICD-10-CM

## 2013-11-15 DIAGNOSIS — E559 Vitamin D deficiency, unspecified: Secondary | ICD-10-CM

## 2013-11-15 DIAGNOSIS — F329 Major depressive disorder, single episode, unspecified: Secondary | ICD-10-CM

## 2013-11-15 DIAGNOSIS — E785 Hyperlipidemia, unspecified: Secondary | ICD-10-CM

## 2013-11-15 DIAGNOSIS — I10 Essential (primary) hypertension: Secondary | ICD-10-CM

## 2013-11-15 DIAGNOSIS — IMO0001 Reserved for inherently not codable concepts without codable children: Secondary | ICD-10-CM

## 2013-11-15 DIAGNOSIS — E538 Deficiency of other specified B group vitamins: Secondary | ICD-10-CM

## 2013-11-15 DIAGNOSIS — M1990 Unspecified osteoarthritis, unspecified site: Secondary | ICD-10-CM

## 2013-11-15 DIAGNOSIS — F3289 Other specified depressive episodes: Secondary | ICD-10-CM

## 2013-11-15 DIAGNOSIS — K219 Gastro-esophageal reflux disease without esophagitis: Secondary | ICD-10-CM

## 2013-11-15 NOTE — Progress Notes (Signed)
Subjective:    Patient ID: [name], female    DOB: [birth year], 73 y.o.   MRN: [id number]  HPI Patient come in today for routine follow up on chronic medical conditions. Please see the review of systems for other symptoms that she is currently experiencing. Her recent lab work will be reviewed with her during the visit today. On health maintenance issues Prevnar vaccine was given today. She will be due a DEXA scan in September.    Review of Systems  Constitutional: Negative.   HENT: Negative.   Eyes:       Glaucoma-Following up with [facility]  Respiratory: Negative.   Cardiovascular: Negative.   Gastrointestinal: Negative.   Endocrine: Negative.   Genitourinary: Negative.   Musculoskeletal:       Continued left shoulder pain s/p fall. Ibuprofen helps.  Skin: Positive for color change. Wound: Bilateral lower extremity discoloration.  Allergic/Immunologic: Negative.   Neurological: Negative.   Hematological: Bruises/bleeds easily.  Psychiatric/Behavioral:       Depression       Objective:   Physical Exam  Nursing note and vitals reviewed. Constitutional: She is oriented to person, place, and time. She appears well-developed and well-nourished. No distress.  HENT:  Head: Normocephalic and atraumatic.  Right Ear: External ear normal.  Left Ear: External ear normal.  Nose: Nose normal.  Mouth/Throat: Oropharynx is clear and moist. No oropharyngeal exudate.  Bilateral ear cerumen removed successfully with a curet. Right TM has a hole in the eardrum  Eyes: Conjunctivae and EOM are normal. Pupils are equal, round, and reactive to light. Right eye exhibits no discharge. Left eye exhibits no discharge. No scleral icterus.  Neck: Normal range of motion. Neck supple. No thyromegaly present.  No carotid bruits  Cardiovascular: Normal rate, regular rhythm, normal heart sounds and intact distal pulses.  Exam reveals no gallop and no friction rub.   No murmur  heard. At 72 per minute  Pulmonary/Chest: Effort normal and breath sounds normal. No respiratory distress. She has no wheezes. She has no rales. She exhibits no tenderness.  Abdominal: Soft. Bowel sounds are normal. She exhibits no mass. There is no tenderness. There is no rebound and no guarding.  Musculoskeletal: Normal range of motion. She exhibits no edema and no tenderness.  Lymphadenopathy:    She has no cervical adenopathy.  Neurological: She is alert and oriented to person, place, and time. She has normal reflexes. No cranial nerve deficit.  Skin: Skin is warm and dry. No rash noted. No erythema. No pallor.  The patient has a lot of bruising on both arms and legs. The recent CBC that was reviewed and had a normal platelet count and hemoglobin . She does take a baby aspirin daily.  Psychiatric: She has a normal mood and affect. Her behavior is normal. Judgment and thought content normal.   BP 139/69  Pulse 63  Temp(Src) 97.9 F (36.6 C) (Oral)  Ht 5\' 5"  (1.651 m)  Wt 187 lb (84.823 kg)  BMI 31.12 kg/m2       Assessment & Plan:  1. Vitamin B 12 deficiency  2. OA (osteoarthritis)  3. GERD (gastroesophageal reflux disease)  4. Hypertension  5. Need for pneumococcal vaccination - Pneumococcal conjugate vaccine 13-valent  6. Unspecified vitamin D deficiency  7. Hyperlipidemia  8. DEPRESSION  9. FIBROMYALGIA   Patient Instructions  Medicare Annual Wellness Visit  [facility] and the medical providers at [facility] strive to bring you the best medical care.  In doing so we not only want to address your current medical conditions and concerns but also to detect new conditions early and prevent illness, disease and health-related problems.    Medicare offers a yearly Wellness Visit which allows our clinical staff to assess your need for preventative services including immunizations, lifestyle education, counseling to decrease  risk of preventable diseases and screening for fall risk and other medical concerns.    This visit is provided free of charge (no copay) for all Medicare recipients. The clinical pharmacists at [facility] have begun to conduct these Wellness Visits which will also include a thorough review of all your medications.    As you primary medical provider recommend that you make an appointment for your Annual Wellness Visit if you have not done so already this year.  You may set up this appointment before you leave today or you may call back [phone]) and schedule an appointment.  Please make sure when you call that you mention that you are scheduling your Annual Wellness Visit with the clinical pharmacist so that the appointment may be made for the proper length of time.    Pneumococcal Vaccine, Polyvalent suspension for injection What is this medicine? PNEUMOCOCCAL VACCINE, POLYVALENT (NEU mo KOK al vak SEEN, pol ee VEY luhnt) is a vaccine to prevent pneumococcus bacteria infection. These bacteria are a major cause of ear infections, 'Strep throat' infections, and serious pneumonia, meningitis, or blood infections worldwide. These vaccines help the body to produce antibodies (protective substances) that help your body defend against these bacteria. This vaccine is recommended for infants and young children. This vaccine will not treat an infection. This medicine may be used for other purposes; ask your health care provider or pharmacist if you have questions. COMMON BRAND NAME(S): Prevnar 13 , Prevnar What should I tell my health care provider before I take this medicine? They need to know if you have any of these conditions: -bleeding problems -fever -immune system problems -low platelet count in the blood -seizures -an unusual or allergic reaction to pneumococcal vaccine, diphtheria toxoid, other vaccines, latex, other medicines, foods, dyes, or preservatives -pregnant or  trying to get pregnant -breast-feeding How should I use this medicine? This vaccine is for injection into a muscle. It is given by a health care professional. A copy of Vaccine Information Statements will be given before each vaccination. Read this sheet carefully each time. The sheet may change frequently. Talk to your pediatrician regarding the use of this medicine in children. While this drug may be prescribed for children as young as 57 weeks old for selected conditions, precautions do apply. Overdosage: If you think you have taken too much of this medicine contact a poison control center or emergency room at once. NOTE: This medicine is only for you. Do not share this medicine with others. What if I miss a dose? It is important not to miss your dose. Call your doctor or health care professional if you are unable to keep an appointment. What may interact with this medicine? -medicines for cancer chemotherapy -medicines that suppress your immune function -medicines that treat or prevent blood clots like warfarin, enoxaparin, and dalteparin -steroid medicines like prednisone or cortisone This list may not describe all possible interactions. Give your health care provider a list of all the medicines, herbs, non-prescription drugs, or dietary  supplements you use. Also tell them if you smoke, drink alcohol, or use illegal drugs. Some items may interact with your medicine. What should I watch for while using this medicine? Mild fever and pain should go away in 3 days or less. Report any unusual symptoms to your doctor or health care professional. What side effects may I notice from receiving this medicine? Side effects that you should report to your doctor or health care professional as soon as possible: -allergic reactions like skin rash, itching or hives, swelling of the face, lips, or tongue -breathing problems -confused -fever over 102 degrees F -pain, tingling, numbness in the hands or  feet -seizures -unusual bleeding or bruising -unusual muscle weakness Side effects that usually do not require medical attention (report to your doctor or health care professional if they continue or are bothersome): -aches and pains -diarrhea -fever of 102 degrees F or less -headache -irritable -loss of appetite -pain, tender at site where injected -trouble sleeping This list may not describe all possible side effects. Call your doctor for medical advice about side effects. You may report side effects to FDA at 1-800-FDA-1088. Where should I keep my medicine? This does not apply. This vaccine is given in a clinic, pharmacy, doctor's office, or other health care setting and will not be stored at home. NOTE: This sheet is a summary. It may not cover all possible information. If you have questions about this medicine, talk to your doctor, pharmacist, or health care provider.  2014, Elsevier/Gold Standard. (2008-08-26 10:17:22)  Continue current medications. Continue good therapeutic lifestyle changes which include good diet and exercise. Fall precautions discussed with patient. If an FOBT was given today- please return it to our front desk. If you are over 66 years old - you may need Prevnar 3 or the adult Pneumonia vaccine.  Try to stay as active as possible and did not put yourself at risk for falling   Arrie Senate MD

## 2013-11-15 NOTE — Addendum Note (Signed)
Addended by: Ilean China on: 11/15/2013 11:45 AM   Modules accepted: Orders

## 2013-11-15 NOTE — Patient Instructions (Addendum)
Medicare Annual Wellness Visit  [facility] and the medical providers at [facility] strive to bring you the best medical care.  In doing so we not only want to address your current medical conditions and concerns but also to detect new conditions early and prevent illness, disease and health-related problems.    Medicare offers a yearly Wellness Visit which allows our clinical staff to assess your need for preventative services including immunizations, lifestyle education, counseling to decrease risk of preventable diseases and screening for fall risk and other medical concerns.    This visit is provided free of charge (no copay) for all Medicare recipients. The clinical pharmacists at [facility] have begun to conduct these Wellness Visits which will also include a thorough review of all your medications.    As you primary medical provider recommend that you make an appointment for your Annual Wellness Visit if you have not done so already this year.  You may set up this appointment before you leave today or you may call back [phone]) and schedule an appointment.  Please make sure when you call that you mention that you are scheduling your Annual Wellness Visit with the clinical pharmacist so that the appointment may be made for the proper length of time.    Pneumococcal Vaccine, Polyvalent suspension for injection What is this medicine? PNEUMOCOCCAL VACCINE, POLYVALENT (NEU mo KOK al vak SEEN, pol ee VEY luhnt) is a vaccine to prevent pneumococcus bacteria infection. These bacteria are a major cause of ear infections, 'Strep throat' infections, and serious pneumonia, meningitis, or blood infections worldwide. These vaccines help the body to produce antibodies (protective substances) that help your body defend against these bacteria. This vaccine is recommended for infants and young children. This vaccine will not treat an  infection. This medicine may be used for other purposes; ask your health care provider or pharmacist if you have questions. COMMON BRAND NAME(S): Prevnar 13 , Prevnar What should I tell my health care provider before I take this medicine? They need to know if you have any of these conditions: -bleeding problems -fever -immune system problems -low platelet count in the blood -seizures -an unusual or allergic reaction to pneumococcal vaccine, diphtheria toxoid, other vaccines, latex, other medicines, foods, dyes, or preservatives -pregnant or trying to get pregnant -breast-feeding How should I use this medicine? This vaccine is for injection into a muscle. It is given by a health care professional. A copy of Vaccine Information Statements will be given before each vaccination. Read this sheet carefully each time. The sheet may change frequently. Talk to your pediatrician regarding the use of this medicine in children. While this drug may be prescribed for children as young as 46 weeks old for selected conditions, precautions do apply. Overdosage: If you think you have taken too much of this medicine contact a poison control center or emergency room at once. NOTE: This medicine is only for you. Do not share this medicine with others. What if I miss a dose? It is important not to miss your dose. Call your doctor or health care professional if you are unable to keep an appointment. What may interact with this medicine? -medicines for cancer chemotherapy -medicines that suppress your immune function -medicines that treat or prevent blood clots like warfarin, enoxaparin, and dalteparin -steroid medicines like prednisone or cortisone This list may not describe all possible interactions. Give your health care provider a list of all the medicines, herbs, non-prescription drugs, or dietary supplements  you use. Also tell them if you smoke, drink alcohol, or use illegal drugs. Some items may interact  with your medicine. What should I watch for while using this medicine? Mild fever and pain should go away in 3 days or less. Report any unusual symptoms to your doctor or health care professional. What side effects may I notice from receiving this medicine? Side effects that you should report to your doctor or health care professional as soon as possible: -allergic reactions like skin rash, itching or hives, swelling of the face, lips, or tongue -breathing problems -confused -fever over 102 degrees F -pain, tingling, numbness in the hands or feet -seizures -unusual bleeding or bruising -unusual muscle weakness Side effects that usually do not require medical attention (report to your doctor or health care professional if they continue or are bothersome): -aches and pains -diarrhea -fever of 102 degrees F or less -headache -irritable -loss of appetite -pain, tender at site where injected -trouble sleeping This list may not describe all possible side effects. Call your doctor for medical advice about side effects. You may report side effects to FDA at 1-800-FDA-1088. Where should I keep my medicine? This does not apply. This vaccine is given in a clinic, pharmacy, doctor's office, or other health care setting and will not be stored at home. NOTE: This sheet is a summary. It may not cover all possible information. If you have questions about this medicine, talk to your doctor, pharmacist, or health care provider.  2014, Elsevier/Gold Standard. (2008-08-26 10:17:22)  Continue current medications. Continue good therapeutic lifestyle changes which include good diet and exercise. Fall precautions discussed with patient. If an FOBT was given today- please return it to our front desk. If you are over 71 years old - you may need Prevnar 48 or the adult Pneumonia vaccine.  Try to stay as active as possible and did not put yourself at risk for falling

## 2013-11-22 ENCOUNTER — Other Ambulatory Visit: Payer: Medicare Other

## 2013-11-22 DIAGNOSIS — Z1212 Encounter for screening for malignant neoplasm of rectum: Secondary | ICD-10-CM

## 2013-11-22 NOTE — Progress Notes (Signed)
Patient dropped off fobt 

## 2013-11-24 LAB — FECAL OCCULT BLOOD, IMMUNOCHEMICAL: Fecal Occult Bld: NEGATIVE

## 2013-11-29 ENCOUNTER — Other Ambulatory Visit: Payer: Self-pay | Admitting: Family Medicine

## 2013-12-04 ENCOUNTER — Other Ambulatory Visit: Payer: Self-pay | Admitting: Family Medicine

## 2013-12-05 NOTE — Telephone Encounter (Signed)
Last seen 11/15/13 DWM  If approved route to nurse to call into [facility]

## 2013-12-09 ENCOUNTER — Ambulatory Visit (INDEPENDENT_AMBULATORY_CARE_PROVIDER_SITE_OTHER): Payer: Medicare Other | Admitting: Family

## 2013-12-09 ENCOUNTER — Other Ambulatory Visit: Payer: Self-pay | Admitting: *Deleted

## 2013-12-09 VITALS — BP 139/78 | HR 70 | Temp 97.5°F | Ht 65.0 in | Wt 186.6 lb

## 2013-12-09 DIAGNOSIS — S40269A Insect bite (nonvenomous) of unspecified shoulder, initial encounter: Secondary | ICD-10-CM

## 2013-12-09 DIAGNOSIS — S40862A Insect bite (nonvenomous) of left upper arm, initial encounter: Secondary | ICD-10-CM

## 2013-12-09 DIAGNOSIS — W57XXXA Bitten or stung by nonvenomous insect and other nonvenomous arthropods, initial encounter: Principal | ICD-10-CM

## 2013-12-09 MED ORDER — ZOLPIDEM TARTRATE 10 MG PO TABS: ORAL_TABLET | ORAL | Status: DC

## 2013-12-09 MED ORDER — DOXYCYCLINE HYCLATE 100 MG PO TABS: 100.0000 mg | ORAL_TABLET | Freq: Two times a day (BID) | ORAL | Status: DC

## 2013-12-09 NOTE — Progress Notes (Signed)
   Subjective:    Patient ID: [name], female    DOB: 1940-10-31, 73 y.o.   MRN: [id number]  HPI Pt found a tick under her left arm last Wednesday. Pt is complaining of itching, redness, and mild pain. Pt has used ice packs and neosporin to area with mild relief.  PT denies any fever or joint pain.    Review of Systems  HENT: Negative.   Respiratory: Negative.   Genitourinary: Negative.   Musculoskeletal: Negative.   Skin: Negative.   Neurological: Negative.   Hematological: Negative.   Psychiatric/Behavioral: Negative.   All other systems reviewed and are negative.      Objective:   Physical Exam  Vitals reviewed. Constitutional: She is oriented to person, place, and time. She appears well-developed and well-nourished. No distress.  Cardiovascular: Normal rate, regular rhythm, normal heart sounds and intact distal pulses.   No murmur heard. Pulmonary/Chest: Effort normal and breath sounds normal. No respiratory distress. She has no wheezes.  Abdominal: Soft. Bowel sounds are normal. She exhibits no distension. There is no tenderness.  Musculoskeletal: Normal range of motion. She exhibits no edema and no tenderness.  Neurological: She is alert and oriented to person, place, and time.  Skin: Skin is warm and dry. There is erythema.  Mild erythemas and excoriation under left arm. (Pt states she has been scratching her arm)    Psychiatric: She has a normal mood and affect. Her behavior is normal. Judgment and thought content normal.    BP 139/78  Pulse 70  Temp(Src) 97.5 F (36.4 C) (Oral)  Ht 5\' 5"  (1.651 m)  Wt 186 lb 9.6 oz (84.641 kg)  BMI 31.05 kg/m2       Assessment & Plan:  1. Tick bite of left upper arm -Watch site for s/s of infection -Do not scratch area -Report any fevers or joint pain -Use precautions while outside-Long sleeve shirts, long pants, ects - doxycycline (VIBRA-TABS) 100 MG tablet; Take 1 tablet (100 mg total) by mouth 2 (two) times daily.   Dispense: 28 tablet; Refill: 0  Evelina Dun, FNP

## 2013-12-09 NOTE — Patient Instructions (Signed)
Tick Bite Information Ticks are insects that attach themselves to the skin and draw blood for food. There are various types of ticks. Common types include wood ticks and deer ticks. Most ticks live in shrubs and grassy areas. Ticks can climb onto your body when you make contact with leaves or grass where the tick is waiting. The most common places on the body for ticks to attach themselves are the scalp, neck, armpits, waist, and groin. Most tick bites are harmless, but sometimes ticks carry germs that cause diseases. These germs can be spread to a person during the tick's feeding process. The chance of a disease spreading through a tick bite depends on:   The type of tick.  Time of year.   How long the tick is attached.   Geographic location.  HOW CAN YOU PREVENT TICK BITES? Take these steps to help prevent tick bites when you are outdoors:  Wear protective clothing. Long sleeves and long pants are best.   Wear white clothes so you can see ticks more easily.  Tuck your pant legs into your socks.   If walking on a trail, stay in the middle of the trail to avoid brushing against bushes.  Avoid walking through areas with long grass.  Put insect repellent on all exposed skin and along boot tops, pant legs, and sleeve cuffs.   Check clothing, hair, and skin repeatedly and before going inside.   Brush off any ticks that are not attached.  Take a shower or bath as soon as possible after being outdoors.  WHAT IS THE PROPER WAY TO REMOVE A TICK? Ticks should be removed as soon as possible to help prevent diseases caused by tick bites. 1. If latex gloves are available, put them on before trying to remove a tick.  2. Using fine-point tweezers, grasp the tick as close to the skin as possible. You may also use curved forceps or a tick removal tool. Grasp the tick as close to its head as possible. Avoid grasping the tick on its body. 3. Pull gently with steady upward pressure until  the tick lets go. Do not twist the tick or jerk it suddenly. This may break off the tick's head or mouth parts. 4. Do not squeeze or crush the tick's body. This could force disease-carrying fluids from the tick into your body.  5. After the tick is removed, wash the bite area and your hands with soap and water or other disinfectant such as alcohol. 6. Apply a small amount of antiseptic cream or ointment to the bite site.  7. Wash and disinfect any instruments that were used.  Do not try to remove a tick by applying a hot match, petroleum jelly, or fingernail polish to the tick. These methods do not work and may increase the chances of disease being spread from the tick bite.  WHEN SHOULD YOU SEEK MEDICAL CARE? Contact your health care provider if you are unable to remove a tick from your skin or if a part of the tick breaks off and is stuck in the skin.  After a tick bite, you need to be aware of signs and symptoms that could be related to diseases spread by ticks. Contact your health care provider if you develop any of the following in the days or weeks after the tick bite:  Unexplained fever.  Rash. A circular rash that appears days or weeks after the tick bite may indicate the possibility of Lyme disease. The rash may resemble   a target with a bull's-eye and may occur at a different part of your body than the tick bite.  Redness and swelling in the area of the tick bite.   Tender, swollen lymph glands.   Diarrhea.   Weight loss.   Cough.   Fatigue.   Muscle, joint, or bone pain.   Abdominal pain.   Headache.   Lethargy or a change in your level of consciousness.  Difficulty walking or moving your legs.   Numbness in the legs.   Paralysis.  Shortness of breath.   Confusion.   Repeated vomiting.  Document Released: 06/10/2000 Document Revised: 04/03/2013 Document Reviewed: 11/21/2012 [employer] Patient Information 2014 [employer].  

## 2013-12-09 NOTE — Telephone Encounter (Signed)
Phoned into pharm - pt aware

## 2013-12-16 ENCOUNTER — Other Ambulatory Visit: Payer: Self-pay | Admitting: Family Medicine

## 2014-01-13 ENCOUNTER — Ambulatory Visit (INDEPENDENT_AMBULATORY_CARE_PROVIDER_SITE_OTHER): Payer: Medicare Other | Admitting: Pharmacist

## 2014-01-13 ENCOUNTER — Encounter: Payer: Self-pay | Admitting: Pharmacist

## 2014-01-13 VITALS — BP 126/78 | HR 72 | Ht 65.0 in | Wt 184.8 lb

## 2014-01-13 DIAGNOSIS — Z0000 Encounter for general adult medical examination without abnormal findings: Secondary | ICD-10-CM

## 2014-01-13 DIAGNOSIS — M8580 Other specified disorders of bone density and structure, unspecified site: Secondary | ICD-10-CM

## 2014-01-13 DIAGNOSIS — E538 Deficiency of other specified B group vitamins: Secondary | ICD-10-CM

## 2014-01-13 MED ORDER — DULOXETINE HCL 60 MG PO CPEP: 60.0000 mg | ORAL_CAPSULE | Freq: Every day | ORAL | Status: DC

## 2014-01-13 MED ORDER — ASPIRIN 81 MG PO TABS: 81.0000 mg | ORAL_TABLET | ORAL | Status: DC

## 2014-01-13 NOTE — Patient Instructions (Signed)
Health Maintenance Summary    INFLUENZA VACCINE Next Due 01/25/2014  Last 03/27/2013    COLON CANCER SCREENING ANNUAL FOBT Next Due 06/14/2014  Last 06/14/2013    TETANUS/TDAP Next Due 10/26/2014  Last 10/25/2004    MAMMOGRAM Next Due 06/08/2015  Last 06/07/2013   DEXA / Bone Density Next Due 02/2014 Last 02/2013   Pneumonia Vaccine Completed 11/15/2013    Zostavax / Shingles Vaccine Completed 03/28/2007     COLONOSCOPY Next Due 05/27/2017  Last was 05/28/2007        Preventive Care for Adults A healthy lifestyle and preventive care can promote health and wellness. Preventive health guidelines for women include the following key practices.  A routine yearly physical is a good way to check with your health care provider about your health and preventive screening. It is a chance to share any concerns and updates on your health and to receive a thorough exam.  Visit your dentist for a routine exam and preventive care every 6 months. Brush your teeth twice a day and floss once a day. Good oral hygiene prevents tooth decay and gum disease.  The frequency of eye exams is based on your age, health, family medical history, use of contact lenses, and other factors. Follow your health care provider's recommendations for frequency of eye exams.  Eat a healthy diet. Foods like vegetables, fruits, whole grains, low-fat dairy products, and lean protein foods contain the nutrients you need without too many calories. Decrease your intake of foods high in solid fats, added sugars, and salt. Eat the right amount of calories for you.Get information about a proper diet from your health care provider, if necessary.  Regular physical exercise is one of the most important things you can do for your health. Most adults should get at least 150 minutes of moderate-intensity exercise (any activity that increases your heart rate and causes you to sweat) each week. In addition, most adults need muscle-strengthening exercises  on 2 or more days a week.  Maintain a healthy weight. The body mass index (BMI) is a screening tool to identify possible weight problems. It provides an estimate of body fat based on height and weight. Your health care provider can find your BMI, and can help you achieve or maintain a healthy weight.For adults 20 years and older:  A BMI below 18.5 is considered underweight.  A BMI of 18.5 to 24.9 is normal.  A BMI of 25 to 29.9 is considered overweight.  A BMI of 30 and above is considered obese.  Maintain normal blood lipids and cholesterol levels by exercising and minimizing your intake of saturated fat. Eat a balanced diet with plenty of fruit and vegetables. Blood tests for lipids and cholesterol should begin at age 9 and be repeated every 5 years. If your lipid or cholesterol levels are high, you are over 50, or you are at high risk for heart disease, you may need your cholesterol levels checked more frequently.Ongoing high lipid and cholesterol levels should be treated with medicines if diet and exercise are not working.  If you smoke, find out from your health care provider how to quit. If you do not use tobacco, do not start.  Lung cancer screening is recommended for adults aged 70-80 years who are at high risk for developing lung cancer because of a history of smoking. A yearly low-dose CT scan of the lungs is recommended for people who have at least a 30-pack-year history of smoking and are a current smoker  or have quit within the past 15 years. A pack year of smoking is smoking an average of 1 pack of cigarettes a day for 1 year (for example: 1 pack a day for 30 years or 2 packs a day for 15 years). Yearly screening should continue until the smoker has stopped smoking for at least 15 years. Yearly screening should be stopped for people who develop a health problem that would prevent them from having lung cancer treatment.  If you are pregnant, do not drink alcohol. If you are  breastfeeding, be very cautious about drinking alcohol. If you are not pregnant and choose to drink alcohol, do not have more than 1 drink per day. One drink is considered to be 12 ounces (355 mL) of beer, 5 ounces (148 mL) of wine, or 1.5 ounces (44 mL) of liquor.  Avoid use of street drugs. Do not share needles with anyone. Ask for help if you need support or instructions about stopping the use of drugs.  High blood pressure causes heart disease and increases the risk of stroke. Your blood pressure should be checked at least every 1 to 2 years. Ongoing high blood pressure should be treated with medicines if weight loss and exercise do not work.  If you are 23-81 years old, ask your health care provider if you should take aspirin to prevent strokes.  Diabetes screening involves taking a blood sample to check your fasting blood sugar level. This should be done once every 3 years, after age 16, if you are within normal weight and without risk factors for diabetes. Testing should be considered at a younger age or be carried out more frequently if you are overweight and have at least 1 risk factor for diabetes.  Breast cancer screening is essential preventive care for women. You should practice "breast self-awareness." This means understanding the normal appearance and feel of your breasts and may include breast self-examination. Any changes detected, no matter how small, should be reported to a health care provider. Women in their 24s and 30s should have a clinical breast exam (CBE) by a health care provider as part of a regular health exam every 1 to 3 years. After age 70, women should have a CBE every year. Starting at age 69, women should consider having a mammogram (breast X-ray test) every year. Women who have a family history of breast cancer should talk to their health care provider about genetic screening. Women at a high risk of breast cancer should talk to their health care providers about having  an MRI and a mammogram every year.  Breast cancer gene (BRCA)-related cancer risk assessment is recommended for women who have family members with BRCA-related cancers. BRCA-related cancers include breast, ovarian, tubal, and peritoneal cancers. Having family members with these cancers may be associated with an increased risk for harmful changes (mutations) in the breast cancer genes BRCA1 and BRCA2. Results of the assessment will determine the need for genetic counseling and BRCA1 and BRCA2 testing.  Routine pelvic exams to screen for cancer are no longer recommended for nonpregnant women who are considered low risk for cancer of the pelvic organs (ovaries, uterus, and vagina) and who do not have symptoms. Ask your health care provider if a screening pelvic exam is right for you.  If you have had past treatment for cervical cancer or a condition that could lead to cancer, you need Pap tests and screening for cancer for at least 20 years after your treatment. If Pap tests  have been discontinued, your risk factors (such as having a new sexual partner) need to be reassessed to determine if screening should be resumed. Some women have medical problems that increase the chance of getting cervical cancer. In these cases, your health care provider may recommend more frequent screening and Pap tests.  The HPV test is an additional test that may be used for cervical cancer screening. The HPV test looks for the virus that can cause the cell changes on the cervix. The cells collected during the Pap test can be tested for HPV. The HPV test could be used to screen women aged 13 years and older, and should be used in women of any age who have unclear Pap test results. After the age of 20, women should have HPV testing at the same frequency as a Pap test.  Colorectal cancer can be detected and often prevented. Most routine colorectal cancer screening begins at the age of 22 years and continues through age 48 years.  However, your health care provider may recommend screening at an earlier age if you have risk factors for colon cancer. On a yearly basis, your health care provider may provide home test kits to check for hidden blood in the stool. Use of a small camera at the end of a tube, to directly examine the colon (sigmoidoscopy or colonoscopy), can detect the earliest forms of colorectal cancer. Talk to your health care provider about this at age 70, when routine screening begins. Direct exam of the colon should be repeated every 5-10 years through age 40 years, unless early forms of pre-cancerous polyps or small growths are found.  People who are at an increased risk for hepatitis B should be screened for this virus. You are considered at high risk for hepatitis B if:  You were born in a country where hepatitis B occurs often. Talk with your health care provider about which countries are considered high risk.  Your parents were born in a high-risk country and you have not received a shot to protect against hepatitis B (hepatitis B vaccine).  You have HIV or AIDS.  You use needles to inject street drugs.  You live with, or have sex with, someone who has Hepatitis B.  You get hemodialysis treatment.  You take certain medicines for conditions like cancer, organ transplantation, and autoimmune conditions.  Hepatitis C blood testing is recommended for all people born from [birth year] through 1965 and any individual with known risks for hepatitis C.  Practice safe sex. Use condoms and avoid high-risk sexual practices to reduce the spread of sexually transmitted infections (STIs). STIs include gonorrhea, chlamydia, syphilis, trichomonas, herpes, HPV, and human immunodeficiency virus (HIV). Herpes, HIV, and HPV are viral illnesses that have no cure. They can result in disability, cancer, and death.  You should be screened for sexually transmitted illnesses (STIs) including gonorrhea and chlamydia if:  You are  sexually active and are younger than 24 years.  You are older than 24 years and your health care provider tells you that you are at risk for this type of infection.  Your sexual activity has changed since you were last screened and you are at an increased risk for chlamydia or gonorrhea. Ask your health care provider if you are at risk.  If you are at risk of being infected with HIV, it is recommended that you take a prescription medicine daily to prevent HIV infection. This is called preexposure prophylaxis (PrEP). You are considered at risk if:  You  are a heterosexual woman, are sexually active, and are at increased risk for HIV infection.  You take drugs by injection.  You are sexually active with a partner who has HIV.  Talk with your health care provider about whether you are at high risk of being infected with HIV. If you choose to begin PrEP, you should first be tested for HIV. You should then be tested every 3 months for as long as you are taking PrEP.  Osteoporosis is a disease in which the bones lose minerals and strength with aging. This can result in serious bone fractures or breaks. The risk of osteoporosis can be identified using a bone density scan. Women ages 53 years and over and women at risk for fractures or osteoporosis should discuss screening with their health care providers. Ask your health care provider whether you should take a calcium supplement or vitamin D to reduce the rate of osteoporosis.  Menopause can be associated with physical symptoms and risks. Hormone replacement therapy is available to decrease symptoms and risks. You should talk to your health care provider about whether hormone replacement therapy is right for you.  Use sunscreen. Apply sunscreen liberally and repeatedly throughout the day. You should seek shade when your shadow is shorter than you. Protect yourself by wearing long sleeves, pants, a wide-brimmed hat, and sunglasses year round, whenever you  are outdoors.  Once a month, do a whole body skin exam, using a mirror to look at the skin on your back. Tell your health care provider of new moles, moles that have irregular borders, moles that are larger than a pencil eraser, or moles that have changed in shape or color.  Stay current with required vaccines (immunizations).  Influenza vaccine. All adults should be immunized every year.  Tetanus, diphtheria, and acellular pertussis (Td, Tdap) vaccine. Pregnant women should receive 1 dose of Tdap vaccine during each pregnancy. The dose should be obtained regardless of the length of time since the last dose. Immunization is preferred during the 27th-36th week of gestation. An adult who has not previously received Tdap or who does not know her vaccine status should receive 1 dose of Tdap. This initial dose should be followed by tetanus and diphtheria toxoids (Td) booster doses every 10 years. Adults with an unknown or incomplete history of completing a 3-dose immunization series with Td-containing vaccines should begin or complete a primary immunization series including a Tdap dose. Adults should receive a Td booster every 10 years.  Varicella vaccine. An adult without evidence of immunity to varicella should receive 2 doses or a second dose if she has previously received 1 dose. Pregnant females who do not have evidence of immunity should receive the first dose after pregnancy. This first dose should be obtained before leaving the health care facility. The second dose should be obtained 4-8 weeks after the first dose.  Human papillomavirus (HPV) vaccine. Females aged 13-26 years who have not received the vaccine previously should obtain the 3-dose series. The vaccine is not recommended for use in pregnant females. However, pregnancy testing is not needed before receiving a dose. If a female is found to be pregnant after receiving a dose, no treatment is needed. In that case, the remaining doses should be  delayed until after the pregnancy. Immunization is recommended for any person with an immunocompromised condition through the age of 33 years if she did not get any or all doses earlier. During the 3-dose series, the second dose should be obtained 4-8  weeks after the first dose. The third dose should be obtained 24 weeks after the first dose and 16 weeks after the second dose.  Zoster vaccine. One dose is recommended for adults aged 66 years or older unless certain conditions are present.  Measles, mumps, and rubella (MMR) vaccine. Adults born before [birth year] generally are considered immune to measles and mumps. Adults born in [birth year] or later should have 1 or more doses of MMR vaccine unless there is a contraindication to the vaccine or there is laboratory evidence of immunity to each of the three diseases. A routine second dose of MMR vaccine should be obtained at least 28 days after the first dose for students attending postsecondary schools, health care workers, or international travelers. People who received inactivated measles vaccine or an unknown type of measles vaccine during 1963-1967 should receive 2 doses of MMR vaccine. People who received inactivated mumps vaccine or an unknown type of mumps vaccine before 1979 and are at high risk for mumps infection should consider immunization with 2 doses of MMR vaccine. For females of childbearing age, rubella immunity should be determined. If there is no evidence of immunity, females who are not pregnant should be vaccinated. If there is no evidence of immunity, females who are pregnant should delay immunization until after pregnancy. Unvaccinated health care workers born before [birth year] who lack laboratory evidence of measles, mumps, or rubella immunity or laboratory confirmation of disease should consider measles and mumps immunization with 2 doses of MMR vaccine or rubella immunization with 1 dose of MMR vaccine.  Pneumococcal 13-valent conjugate (PCV13) vaccine.  When indicated, a person who is uncertain of her immunization history and has no record of immunization should receive the PCV13 vaccine. An adult aged 66 years or older who has certain medical conditions and has not been previously immunized should receive 1 dose of PCV13 vaccine. This PCV13 should be followed with a dose of pneumococcal polysaccharide (PPSV23) vaccine. The PPSV23 vaccine dose should be obtained at least 8 weeks after the dose of PCV13 vaccine. An adult aged 19 years or older who has certain medical conditions and previously received 1 or more doses of PPSV23 vaccine should receive 1 dose of PCV13. The PCV13 vaccine dose should be obtained 1 or more years after the last PPSV23 vaccine dose.  Pneumococcal polysaccharide (PPSV23) vaccine. When PCV13 is also indicated, PCV13 should be obtained first. All adults aged 84 years and older should be immunized. An adult younger than age 37 years who has certain medical conditions should be immunized. Any person who resides in a nursing home or long-term care facility should be immunized. An adult smoker should be immunized. People with an immunocompromised condition and certain other conditions should receive both PCV13 and PPSV23 vaccines. People with human immunodeficiency virus (HIV) infection should be immunized as soon as possible after diagnosis. Immunization during chemotherapy or radiation therapy should be avoided. Routine use of PPSV23 vaccine is not recommended for American Indians, [place] Natives, or people younger than 65 years unless there are medical conditions that require PPSV23 vaccine. When indicated, people who have unknown immunization and have no record of immunization should receive PPSV23 vaccine. One-time revaccination 5 years after the first dose of PPSV23 is recommended for people aged 19-64 years who have chronic kidney failure, nephrotic syndrome, asplenia, or immunocompromised conditions. People who received 1-2 doses of  PPSV23 before age 41 years should receive another dose of PPSV23 vaccine at age 27 years or later if at least 5 years  have passed since the previous dose. Doses of PPSV23 are not needed for people immunized with PPSV23 at or after age 67 years.  Meningococcal vaccine. Adults with asplenia or persistent complement component deficiencies should receive 2 doses of quadrivalent meningococcal conjugate (MenACWY-D) vaccine. The doses should be obtained at least 2 months apart. Microbiologists working with certain meningococcal bacteria, [place] recruits, people at risk during an outbreak, and people who travel to or live in countries with a high rate of meningitis should be immunized. A first-year college student up through age 68 years who is living in a residence hall should receive a dose if she did not receive a dose on or after her 16th birthday. Adults who have certain high-risk conditions should receive one or more doses of vaccine.  Hepatitis A vaccine. Adults who wish to be protected from this disease, have certain high-risk conditions, work with hepatitis A-infected animals, work in hepatitis A research labs, or travel to or work in countries with a high rate of hepatitis A should be immunized. Adults who were previously unvaccinated and who anticipate close contact with an international adoptee during the first 60 days after arrival in the [place] from a country with a high rate of hepatitis A should be immunized.  Hepatitis B vaccine. Adults who wish to be protected from this disease, have certain high-risk conditions, may be exposed to blood or other infectious body fluids, are household contacts or sex partners of hepatitis B positive people, are clients or workers in certain care facilities, or travel to or work in countries with a high rate of hepatitis B should be immunized.

## 2014-01-13 NOTE — Progress Notes (Signed)
Subjective:    [name] is a 73 y.o. female who presents for Medicare Initial Wellness Visit  Preventive Screening-Counseling & Management  Tobacco History  Smoking status  . Never Smoker   Smokeless tobacco  . Never Used    Current Problems (verified) Patient Active Problem List   Diagnosis Date Noted  . Proximal humerus fracture 05/30/2011  . CHEST PAIN, PRECORDIAL 02/26/2010  . HYPERLIPIDEMIA 02/23/2010  . DEPRESSION 02/23/2010  . GLAUCOMA 02/23/2010  . CATARACTS 02/23/2010  . RHEUMATIC FEVER 02/23/2010  . HYPERTENSION 02/23/2010  . MITRAL VALVE PROLAPSE 02/23/2010  . GERD 02/23/2010  . OSTEOARTHRITIS 02/23/2010  . FIBROMYALGIA 02/23/2010    Medications Prior to Visit Current Outpatient Prescriptions on File Prior to Visit  Medication Sig Dispense Refill  . atenolol (TENORMIN) 50 MG tablet TAKE 1 TABLET DAILY  30 tablet  5  . Cholecalciferol (VITAMIN D3) 1000 UNITS CAPS Take 1 capsule by mouth daily.        . clorazepate (TRANXENE) 7.5 MG tablet TAKE 1 TABLET DAILY AS NEEDED  30 tablet  3  . fluticasone (FLONASE) 50 MCG/ACT nasal spray Place 2 sprays into the nose daily.  16 g  6  . hydrocortisone (PROCTOZONE-HC) 2.5 % rectal cream Place 1 application rectally 2 (two) times daily as needed. For constipation  30 g  2  . Multiple Vitamins-Minerals (SENIOR MULTIVITAMIN PLUS PO) Take by mouth daily.        . Omeprazole Magnesium (PRILOSEC OTC PO) Take 1 capsule by mouth daily. For acid reflux      . simvastatin (ZOCOR) 80 MG tablet TAKE ONE TABLET AT BEDTIME  30 tablet  3  . TRAVATAN Z 0.004 % SOLN ophthalmic solution INSTILL 1 DROP INTO RIGHT EYE AT BEDTIME  2.5 mL  2  . Vitamin B1-B12 (NEURO B-12 IJ) Inject 1 vial as directed every 30 (thirty) days.       Marland Kitchen ibuprofen (ADVIL,MOTRIN) 200 MG tablet Take 200 mg by mouth every 6 (six) hours as needed.       Current Facility-Administered Medications on File Prior to Visit  Medication Dose Route Frequency Provider Last  Rate Last Dose  . cyanocobalamin ((VITAMIN B-12)) injection 1,000 mcg  1,000 mcg Intramuscular Q30 days Chipper Herb, MD   1,000 mcg at 01/13/14 4132    Current Medications (verified) Current Outpatient Prescriptions  Medication Sig Dispense Refill  . aspirin 81 MG tablet Take 1 tablet (81 mg total) by mouth every other day.  30 tablet    . atenolol (TENORMIN) 50 MG tablet TAKE 1 TABLET DAILY  30 tablet  5  . Cholecalciferol (VITAMIN D3) 1000 UNITS CAPS Take 1 capsule by mouth daily.        . clorazepate (TRANXENE) 7.5 MG tablet TAKE 1 TABLET DAILY AS NEEDED  30 tablet  3  . fluticasone (FLONASE) 50 MCG/ACT nasal spray Place 2 sprays into the nose daily.  16 g  6  . hydrocortisone (PROCTOZONE-HC) 2.5 % rectal cream Place 1 application rectally 2 (two) times daily as needed. For constipation  30 g  2  . Multiple Vitamins-Minerals (SENIOR MULTIVITAMIN PLUS PO) Take by mouth daily.        . Omeprazole Magnesium (PRILOSEC OTC PO) Take 1 capsule by mouth daily. For acid reflux      . simvastatin (ZOCOR) 80 MG tablet TAKE ONE TABLET AT BEDTIME  30 tablet  3  . TRAVATAN Z 0.004 % SOLN ophthalmic solution INSTILL 1 DROP INTO  RIGHT EYE AT BEDTIME  2.5 mL  2  . Vitamin B1-B12 (NEURO B-12 IJ) Inject 1 vial as directed every 30 (thirty) days.       Marland Kitchen zolpidem (AMBIEN) 10 MG tablet TAKE 1/2 TABLET AT BEDTIME AS NEEDED FOR SLEEP  30 tablet  2  . DULoxetine (CYMBALTA) 60 MG capsule Take 1 capsule (60 mg total) by mouth daily.  30 capsule  1  . ibuprofen (ADVIL,MOTRIN) 200 MG tablet Take 200 mg by mouth every 6 (six) hours as needed.       Current Facility-Administered Medications  Medication Dose Route Frequency Provider Last Rate Last Dose  . cyanocobalamin ((VITAMIN B-12)) injection 1,000 mcg  1,000 mcg Intramuscular Q30 days Chipper Herb, MD   1,000 mcg at 01/13/14 0857     Allergies (verified) Alendronate sodium; Latex; Levofloxacin; Lipitor; Lyrica; Meloxicam; and Sibutramine hcl monohydrate    PAST HISTORY  Family History Family History  Problem Relation Age of Onset  . Cancer Mother     PANCREATIC   . Heart attack Father 63    Died 68  . Heart disease Father   . Arthritis Father   . Hypertension Sister   . Cancer Sister     skin  . Hypertension Sister     Social History History  Substance Use Topics  . Smoking status: Never Smoker   . Smokeless tobacco: Never Used  . Alcohol Use: No     Are there smokers in your home (other than you)? No  Risk Factors Current exercise habits: Home exercise routine includes walking 30 hrs per day about 2 - 3 days per week.  Dietary issues discussed: low fat   Cardiac risk factors: advanced age (older than 3 for men, 17 for women), dyslipidemia, family history of premature cardiovascular disease, hypertension and obesity (BMI >= 30 kg/m2).  Depression Screen - has about 1-2 episodes per year of felling down / depressed.  Currently taking venlafaxine XR 75mg  daily but sometimes feels "lifeless" when she takes venfaxine (Note: if answer to either of the following is "Yes", a more complete depression screening is indicated)   Over the past 2 weeks, have you felt down, depressed or hopeless? No  Over the past 2 weeks, have you felt little interest or pleasure in doing things? No  Have you lost interest or pleasure in daily life? No  Do you often feel hopeless? No  Do you cry easily over simple problems? No  Activities of Daily Living In your present state of health, do you have any difficulty performing the following activities?:  Driving? No Managing money?  No Feeding yourself? No Getting from bed to chair? No  Climbing a flight of stairs? Yes Preparing food and eating?: No Bathing or showering? No Getting dressed: No Getting to the toilet? No Using the toilet:No Moving around from place to place: No In the past year have you fallen or had a near fall?:Yes   Are you sexually active?  No  Do you have more than one  partner?  No  Hearing Difficulties: No Do you often ask people to speak up or repeat themselves? No Do you experience ringing or noises in your ears? No Do you have difficulty understanding soft or whispered voices? No   Do you feel that you have a problem with memory? No  Do you often misplace items? No  Do you feel safe at home?  Yes  Cognitive Testing  Alert? Yes  Normal Appearance?Yes  Oriented  to person? Yes  Place? Yes   Time? Yes  Recall of three objects?  Yes  Can perform simple calculations? Yes  Displays appropriate judgment?Yes  Can read the correct time from a watch face?Yes   Advanced Directives have been discussed with the patient? Yes  List the Names of Other Physician/Practitioners you currently use: 1.  Arnoldo Morale - ophthalmologist 2.  [place] 3.  [place] - Cardiologist 4.  [place] 5.  [facility] - GI  Indicate any recent Medical Services you may have received from other than [facility] providers in the past year (date may be approximate).  Immunization History  Administered Date(s) Administered  . Influenza,inj,Quad PF,36+ Mos 03/27/2013  . Pneumococcal Conjugate-13 11/15/2013    Screening Tests Health Maintenance  Topic Date Due  . Influenza Vaccine  01/25/2014  . Colon Cancer Screening Annual Fobt  06/14/2014  . Tetanus/tdap  10/26/2014  . Mammogram  06/08/2015  . Colonoscopy  05/27/2017  . Pneumococcal Polysaccharide Vaccine Age 42 And Over  Completed  . Zostavax  Completed    All answers were reviewed with the patient and necessary referrals were made:  Cherre Robins, [facility]   01/13/2014   History reviewed: allergies, current medications, past family history, past medical history, past social history, past surgical history and problem list  Patient expressed concern about increased bruising - notice large bruise on left arm - about 3 inches in diameter and several small bruises on both lower extremeties that are about dime sized.  Last CBC - WNL Patient has history of fibromyalgia but c/o fatigue durin the day which might be related to fibromyalgia or possible effect from sleeping aid - zolpidiem 10mg  qhs   Objective:    Body mass index is 30.74 kg/(m^2). BP 126/78  Pulse 72  Ht 5\' 5"  (1.651 m)  Wt 184 lb 12 oz (83.802 kg)  BMI 30.74 kg/m2   Assessment:   Initial Medicare Wellness Visit  Medication Management - possible side effects from venlafaxine, zolpidem and ASA     Plan:     During the course of the visit the patient was educated and counseled about appropriate screening and preventive services including:    Pneumococcal vaccine   Influenza vaccine  Hepatitis B vaccine  Td vaccine  Screening mammography  Screening Pap smear and pelvic exam   Bone densitometry screening  Colorectal cancer screening  Diabetes screening  Glaucoma screening  Advanced directives: no advanced directives - caring connections packet given  Change venlafaxine to cymbalta 60mg  1 capsule daily in am  Change ASA 81mg  to 1 tablet every other day  Decrease zolpidem 10mg  to 1/2 tablet at bedtime and only as needed for sleeplessness - may help with decrease fall risk  Ordered DEXA recheck for October  Diet review for nutrition referral? Yes ____  Not Indicated ___X_   Patient Instructions (the written plan) was given to the patient.  Medicare Attestation I have personally reviewed: The patient's medical and social history Their use of alcohol, tobacco or illicit drugs Their current medications and supplements The patient's functional ability including ADLs,fall risks, home safety risks, cognitive, and hearing and visual impairment Diet and physical activities Evidence for depression or mood disorders  The patient's weight, height, BMI, and BP/HR been recorded in the chart.  I have made referrals, counseling, and provided education to the patient based on review of the above and I have provided the  patient with a written personalized care plan for preventive services.  Cherre Robins, [facility]   01/13/2014

## 2014-01-17 ENCOUNTER — Encounter (INDEPENDENT_AMBULATORY_CARE_PROVIDER_SITE_OTHER): Payer: Self-pay

## 2014-01-17 ENCOUNTER — Encounter: Payer: Self-pay | Admitting: Family Medicine

## 2014-01-17 ENCOUNTER — Ambulatory Visit (INDEPENDENT_AMBULATORY_CARE_PROVIDER_SITE_OTHER): Payer: Medicare Other | Admitting: Family Medicine

## 2014-01-17 VITALS — BP 125/72 | HR 64 | Temp 97.0°F | Ht 65.0 in | Wt 184.0 lb

## 2014-01-17 DIAGNOSIS — Z23 Encounter for immunization: Secondary | ICD-10-CM

## 2014-01-17 DIAGNOSIS — S81809A Unspecified open wound, unspecified lower leg, initial encounter: Secondary | ICD-10-CM

## 2014-01-17 DIAGNOSIS — T148XXA Other injury of unspecified body region, initial encounter: Secondary | ICD-10-CM

## 2014-01-17 DIAGNOSIS — IMO0001 Reserved for inherently not codable concepts without codable children: Secondary | ICD-10-CM

## 2014-01-17 DIAGNOSIS — S81812A Laceration without foreign body, left lower leg, initial encounter: Secondary | ICD-10-CM

## 2014-01-17 MED ORDER — CEPHALEXIN 500 MG PO CAPS: 500.0000 mg | ORAL_CAPSULE | Freq: Two times a day (BID) | ORAL | Status: DC

## 2014-01-17 NOTE — Patient Instructions (Signed)
Take Tylenol as needed for pain Keep leg elevated for one to 2 days Do not get wound wet Take antibiotic as directed Return to clinic in 3-4 days for recheck

## 2014-01-17 NOTE — Progress Notes (Signed)
Subjective:    Patient ID: [name], female    DOB: 05/12/1941, 73 y.o.   MRN: [id number]  HPI Patient here today for laceration to left lower. This happened at [place] last night around 7 pm. The patient was walking down the [place] and a metal part of some sort that was hinged came through the I'll from the other side and lacerated her left lower leg. She received first aid attention at [facility] and a pressure dressing was applied. She comes in this morning for review of the laceration. It was approximately a 3 inch laceration in the shape of a "V". The lower leg had a lot of bruising and contusions. There is also some scar tissue slightly distal to the laceration from previous surgery.       Patient Active Problem List   Diagnosis Date Noted  . Proximal humerus fracture 05/30/2011  . CHEST PAIN, PRECORDIAL 02/26/2010  . HYPERLIPIDEMIA 02/23/2010  . DEPRESSION 02/23/2010  . GLAUCOMA 02/23/2010  . CATARACTS 02/23/2010  . RHEUMATIC FEVER 02/23/2010  . HYPERTENSION 02/23/2010  . MITRAL VALVE PROLAPSE 02/23/2010  . GERD 02/23/2010  . OSTEOARTHRITIS 02/23/2010  . FIBROMYALGIA 02/23/2010   Outpatient Encounter Prescriptions as of 01/17/2014  Medication Sig  . aspirin 81 MG tablet Take 1 tablet (81 mg total) by mouth every other day.  Marland Kitchen atenolol (TENORMIN) 50 MG tablet TAKE 1 TABLET DAILY  . Cholecalciferol (VITAMIN D3) 1000 UNITS CAPS Take 1 capsule by mouth daily.    . clorazepate (TRANXENE) 7.5 MG tablet TAKE 1 TABLET DAILY AS NEEDED  . fluticasone (FLONASE) 50 MCG/ACT nasal spray Place 2 sprays into the nose daily.  . hydrocortisone (PROCTOZONE-HC) 2.5 % rectal cream Place 1 application rectally 2 (two) times daily as needed. For constipation  . ibuprofen (ADVIL,MOTRIN) 200 MG tablet Take 200 mg by mouth every 6 (six) hours as needed.  . Multiple Vitamins-Minerals (SENIOR MULTIVITAMIN PLUS PO) Take by mouth daily.    . Omeprazole Magnesium (PRILOSEC OTC PO) Take 1 capsule by  mouth daily. For acid reflux  . simvastatin (ZOCOR) 80 MG tablet TAKE ONE TABLET AT BEDTIME  . TRAVATAN Z 0.004 % SOLN ophthalmic solution INSTILL 1 DROP INTO RIGHT EYE AT BEDTIME  . venlafaxine XR (EFFEXOR-XR) 75 MG 24 hr capsule Take 75 mg by mouth daily with breakfast.   . Vitamin B1-B12 (NEURO B-12 IJ) Inject 1 vial as directed every 30 (thirty) days.   Marland Kitchen zolpidem (AMBIEN) 10 MG tablet TAKE 1/2 TABLET AT BEDTIME AS NEEDED FOR SLEEP  . [DISCONTINUED] DULoxetine (CYMBALTA) 60 MG capsule Take 1 capsule (60 mg total) by mouth daily.    Review of Systems  Constitutional: Negative.   HENT: Negative.   Eyes: Negative.   Respiratory: Negative.   Cardiovascular: Negative.   Gastrointestinal: Negative.   Endocrine: Negative.   Genitourinary: Negative.   Musculoskeletal: Negative.   Skin: Negative.        Laceration to left lower leg  Allergic/Immunologic: Negative.   Neurological: Negative.   Hematological: Negative.   Psychiatric/Behavioral: Negative.        Objective:   Physical Exam  Constitutional: She is oriented to person, place, and time. She appears well-developed and well-nourished. No distress.  Musculoskeletal: Normal range of motion.  Neurological: She is alert and oriented to person, place, and time.  Skin: Skin is warm. No rash noted. No erythema.  There is a V. shaped  laceration to the left lower leg with surrounding bruising. The length of  the laceration is approximately 3 inches. The wound was cleansed with Betadine solution and under sterile technique after infiltrating with Xylocaine a mattress suture was inserted with approximately 10 sutures. Using 4-0 nylon. A pressure dressing was applied following the closure of the wound and the band was used to hold the dressing in place. She tolerated the procedure well. It took approximately 40 minutes from the time of seeing the patient to finishing the closure of the wound.  Psychiatric: She has a normal mood and affect.  Her behavior is normal. Judgment and thought content normal.   BP 125/72  Pulse 64  Temp(Src) 97 F (36.1 C) (Oral)  Ht 5\' 5"  (1.651 m)  Wt 184 lb (83.462 kg)  BMI 30.62 kg/m2        Assessment & Plan:  1. Cut - Td vaccine preservative free greater than or equal to 7yo IM  2. Laceration of left leg, initial encounter  Meds ordered this encounter  Medications  . venlafaxine XR (EFFEXOR-XR) 75 MG 24 hr capsule    Sig: Take 75 mg by mouth daily with breakfast.   . cephALEXin (KEFLEX) 500 MG capsule    Sig: Take 1 capsule (500 mg total) by mouth 2 (two) times daily.    Dispense:  14 capsule    Refill:  0   Patient Instructions  Take Tylenol as needed for pain Keep leg elevated for one to 2 days Do not get wound wet Take antibiotic as directed Return to clinic in 3-4 days for recheck   Arrie Senate MD

## 2014-01-21 ENCOUNTER — Encounter: Payer: Self-pay | Admitting: Family Medicine

## 2014-01-21 ENCOUNTER — Ambulatory Visit (INDEPENDENT_AMBULATORY_CARE_PROVIDER_SITE_OTHER): Payer: Medicare Other | Admitting: Family Medicine

## 2014-01-21 VITALS — BP 127/61 | HR 64 | Temp 97.3°F | Ht 65.0 in | Wt 184.0 lb

## 2014-01-21 DIAGNOSIS — Z5189 Encounter for other specified aftercare: Secondary | ICD-10-CM

## 2014-01-21 DIAGNOSIS — S81009A Unspecified open wound, unspecified knee, initial encounter: Secondary | ICD-10-CM

## 2014-01-21 DIAGNOSIS — S81809A Unspecified open wound, unspecified lower leg, initial encounter: Secondary | ICD-10-CM

## 2014-01-21 DIAGNOSIS — S81812D Laceration without foreign body, left lower leg, subsequent encounter: Secondary | ICD-10-CM

## 2014-01-21 DIAGNOSIS — S91009A Unspecified open wound, unspecified ankle, initial encounter: Secondary | ICD-10-CM

## 2014-01-21 NOTE — Patient Instructions (Signed)
Dress the wound twice daily with saline compresses. You may leave it open to the air for short period of time before redressing. If any redness or fever develop please return to the clinic Keep elevated as much as possible Continue and complete the antibiotic Take Tylenol if needed for pain

## 2014-01-21 NOTE — Progress Notes (Signed)
Subjective:    Patient ID: [name], female    DOB: [birth year], 73 y.o.   MRN: [id number]  HPI Patient here today for follow up for wound care. She has a laceration to left lower leg.        Patient Active Problem List   Diagnosis Date Noted  . Proximal humerus fracture 05/30/2011  . CHEST PAIN, PRECORDIAL 02/26/2010  . HYPERLIPIDEMIA 02/23/2010  . DEPRESSION 02/23/2010  . GLAUCOMA 02/23/2010  . CATARACTS 02/23/2010  . RHEUMATIC FEVER 02/23/2010  . HYPERTENSION 02/23/2010  . MITRAL VALVE PROLAPSE 02/23/2010  . GERD 02/23/2010  . OSTEOARTHRITIS 02/23/2010  . FIBROMYALGIA 02/23/2010   Outpatient Encounter Prescriptions as of 01/21/2014  Medication Sig  . aspirin 81 MG tablet Take 1 tablet (81 mg total) by mouth every other day.  Marland Kitchen atenolol (TENORMIN) 50 MG tablet TAKE 1 TABLET DAILY  . cephALEXin (KEFLEX) 500 MG capsule Take 1 capsule (500 mg total) by mouth 2 (two) times daily.  . Cholecalciferol (VITAMIN D3) 1000 UNITS CAPS Take 1 capsule by mouth daily.    . clorazepate (TRANXENE) 7.5 MG tablet TAKE 1 TABLET DAILY AS NEEDED  . DULoxetine (CYMBALTA) 60 MG capsule   . fluticasone (FLONASE) 50 MCG/ACT nasal spray Place 2 sprays into the nose daily.  . hydrocortisone (PROCTOZONE-HC) 2.5 % rectal cream Place 1 application rectally 2 (two) times daily as needed. For constipation  . ibuprofen (ADVIL,MOTRIN) 200 MG tablet Take 200 mg by mouth every 6 (six) hours as needed.  . Multiple Vitamins-Minerals (SENIOR MULTIVITAMIN PLUS PO) Take by mouth daily.    . Omeprazole Magnesium (PRILOSEC OTC PO) Take 1 capsule by mouth daily. For acid reflux  . simvastatin (ZOCOR) 80 MG tablet TAKE ONE TABLET AT BEDTIME  . TRAVATAN Z 0.004 % SOLN ophthalmic solution INSTILL 1 DROP INTO RIGHT EYE AT BEDTIME  . venlafaxine XR (EFFEXOR-XR) 75 MG 24 hr capsule Take 75 mg by mouth daily with breakfast.   . Vitamin B1-B12 (NEURO B-12 IJ) Inject 1 vial as directed every 30 (thirty) days.   Marland Kitchen  zolpidem (AMBIEN) 10 MG tablet TAKE 1/2 TABLET AT BEDTIME AS NEEDED FOR SLEEP    Review of Systems  Constitutional: Negative.   HENT: Negative.   Eyes: Negative.   Respiratory: Negative.   Cardiovascular: Negative.   Gastrointestinal: Negative.   Endocrine: Negative.   Genitourinary: Negative.   Musculoskeletal: Negative.   Skin: Positive for wound (left lower leg laceration).  Allergic/Immunologic: Negative.   Neurological: Negative.   Hematological: Negative.   Psychiatric/Behavioral: Negative.        Objective:   Physical Exam BP 127/61  Pulse 64  Temp(Src) 97.3 F (36.3 C) (Oral)  Ht 5\' 5"  (1.651 m)  Wt 184 lb (83.462 kg)  BMI 30.62 kg/m2 The wound appears to be healing well. There is one area at the top of the wound where the skin was very and that is somewhat separated skin edges because of the thinness of the laceration. There is some bruising. There was a small amount of fluid beneath the wound which was evacuated. The wound was cleansed gently with saline and redressed with saline and dry dressings. The husband was instructed on how to do this. The patient is still taking antibiotics.       Assessment & Plan:  1. Leg laceration, left, subsequent encounter   Patient Instructions  Dress the wound twice daily with saline compresses. You may leave it open to the air for short period  of time before redressing. If any redness or fever develop please return to the clinic Keep elevated as much as possible Continue and complete the antibiotic Take Tylenol if needed for pain   Arrie Senate MD

## 2014-01-28 ENCOUNTER — Ambulatory Visit (INDEPENDENT_AMBULATORY_CARE_PROVIDER_SITE_OTHER): Payer: Medicare Other | Admitting: Family Medicine

## 2014-01-28 ENCOUNTER — Encounter: Payer: Self-pay | Admitting: Family Medicine

## 2014-01-28 VITALS — BP 141/69 | HR 67 | Temp 97.4°F | Ht 65.0 in | Wt 185.0 lb

## 2014-01-28 DIAGNOSIS — S91009A Unspecified open wound, unspecified ankle, initial encounter: Secondary | ICD-10-CM

## 2014-01-28 DIAGNOSIS — Z5189 Encounter for other specified aftercare: Secondary | ICD-10-CM

## 2014-01-28 DIAGNOSIS — S81812D Laceration without foreign body, left lower leg, subsequent encounter: Secondary | ICD-10-CM

## 2014-01-28 DIAGNOSIS — S81009A Unspecified open wound, unspecified knee, initial encounter: Secondary | ICD-10-CM

## 2014-01-28 DIAGNOSIS — S81809A Unspecified open wound, unspecified lower leg, initial encounter: Secondary | ICD-10-CM

## 2014-01-28 NOTE — Progress Notes (Signed)
Subjective:    Patient ID: [name], female    DOB: 1940-08-23, 73 y.o.   MRN: [id number]  HPI Patient here today for follow up of left lower leg laceration and suture removal. She is accompanied today by her husband. They have been changing the bandages twice a day and cleaning it with saline solution.        Patient Active Problem List   Diagnosis Date Noted  . Proximal humerus fracture 05/30/2011  . CHEST PAIN, PRECORDIAL 02/26/2010  . HYPERLIPIDEMIA 02/23/2010  . DEPRESSION 02/23/2010  . GLAUCOMA 02/23/2010  . CATARACTS 02/23/2010  . RHEUMATIC FEVER 02/23/2010  . HYPERTENSION 02/23/2010  . MITRAL VALVE PROLAPSE 02/23/2010  . GERD 02/23/2010  . OSTEOARTHRITIS 02/23/2010  . FIBROMYALGIA 02/23/2010   Outpatient Encounter Prescriptions as of 01/28/2014  Medication Sig  . aspirin 81 MG tablet Take 1 tablet (81 mg total) by mouth every other day.  Marland Kitchen atenolol (TENORMIN) 50 MG tablet TAKE 1 TABLET DAILY  . Cholecalciferol (VITAMIN D3) 1000 UNITS CAPS Take 1 capsule by mouth daily.    . clorazepate (TRANXENE) 7.5 MG tablet TAKE 1 TABLET DAILY AS NEEDED  . DULoxetine (CYMBALTA) 60 MG capsule   . fluticasone (FLONASE) 50 MCG/ACT nasal spray Place 2 sprays into the nose daily.  . hydrocortisone (PROCTOZONE-HC) 2.5 % rectal cream Place 1 application rectally 2 (two) times daily as needed. For constipation  . ibuprofen (ADVIL,MOTRIN) 200 MG tablet Take 200 mg by mouth every 6 (six) hours as needed.  . Multiple Vitamins-Minerals (SENIOR MULTIVITAMIN PLUS PO) Take by mouth daily.    . Omeprazole Magnesium (PRILOSEC OTC PO) Take 1 capsule by mouth daily. For acid reflux  . simvastatin (ZOCOR) 80 MG tablet TAKE ONE TABLET AT BEDTIME  . TRAVATAN Z 0.004 % SOLN ophthalmic solution INSTILL 1 DROP INTO RIGHT EYE AT BEDTIME  . venlafaxine XR (EFFEXOR-XR) 75 MG 24 hr capsule Take 75 mg by mouth daily with breakfast.   . Vitamin B1-B12 (NEURO B-12 IJ) Inject 1 vial as directed every 30  (thirty) days.   Marland Kitchen zolpidem (AMBIEN) 10 MG tablet TAKE 1/2 TABLET AT BEDTIME AS NEEDED FOR SLEEP  . [DISCONTINUED] cephALEXin (KEFLEX) 500 MG capsule Take 1 capsule (500 mg total) by mouth 2 (two) times daily.    Review of Systems  Constitutional: Negative.   HENT: Negative.   Eyes: Negative.   Respiratory: Negative.   Cardiovascular: Negative.   Gastrointestinal: Negative.   Endocrine: Negative.   Genitourinary: Negative.   Musculoskeletal: Negative.   Skin: Positive for wound (left lower leg laceration).  Allergic/Immunologic: Negative.   Neurological: Negative.   Hematological: Negative.   Psychiatric/Behavioral: Negative.        Objective:   Physical Exam BP 141/69  Pulse 67  Temp(Src) 97.4 F (36.3 C) (Oral)  Ht 5\' 5"  (1.651 m)  Wt 185 lb (83.915 kg)  BMI 30.79 kg/m2  The sutures were removed from the wound today. There a couple of areas where there was not enough skin for the wound to heal over properly and these will have to heal in from the edges. The patient tolerated the procedure well. The wound was cleansed with Betadine solution. Adaptic was applied. A saline wet-to-dry was applied with kling dressing.      Assessment & Plan:   1. Leg laceration, left, subsequent encounter Patient Instructions  Continue to dress the wound twice daily with saline calls If there is any any increasing redness or erythema please call us  sooner. Return to clinic in one week   Arrie Senate MD

## 2014-01-28 NOTE — Patient Instructions (Signed)
Continue to dress the wound twice daily with saline calls If there is any any increasing redness or erythema please call us sooner. Return to clinic in one week

## 2014-01-29 ENCOUNTER — Other Ambulatory Visit: Payer: Self-pay | Admitting: *Deleted

## 2014-01-29 MED ORDER — CEPHALEXIN 500 MG PO CAPS: 500.0000 mg | ORAL_CAPSULE | Freq: Two times a day (BID) | ORAL | Status: DC

## 2014-02-04 ENCOUNTER — Encounter: Payer: Self-pay | Admitting: Family Medicine

## 2014-02-04 ENCOUNTER — Ambulatory Visit (INDEPENDENT_AMBULATORY_CARE_PROVIDER_SITE_OTHER): Payer: Medicare Other | Admitting: Family Medicine

## 2014-02-04 VITALS — BP 127/70 | HR 76 | Temp 98.3°F | Ht 65.0 in | Wt 185.0 lb

## 2014-02-04 DIAGNOSIS — Z5189 Encounter for other specified aftercare: Secondary | ICD-10-CM

## 2014-02-04 DIAGNOSIS — S81809A Unspecified open wound, unspecified lower leg, initial encounter: Secondary | ICD-10-CM

## 2014-02-04 DIAGNOSIS — S91009A Unspecified open wound, unspecified ankle, initial encounter: Secondary | ICD-10-CM

## 2014-02-04 DIAGNOSIS — S81812D Laceration without foreign body, left lower leg, subsequent encounter: Secondary | ICD-10-CM

## 2014-02-04 DIAGNOSIS — S81009A Unspecified open wound, unspecified knee, initial encounter: Secondary | ICD-10-CM

## 2014-02-04 NOTE — Patient Instructions (Signed)
Continue daily dressings with also leaving the leg up into the air periodically during the day. Continue to Clean the leg gently with Betadine solution and apply saline gauze to the wound with overlying gauze and kling. If there is any increasing redness or erythema call us back sooner Otherwise return to clinic in one week to recheck the wound and do a dressing here

## 2014-02-04 NOTE — Progress Notes (Signed)
Subjective:    Patient ID: [name], female    DOB: [birth year], 73 y.o.   MRN: [id number]  HPI Pt here for follow up and management of chronic medical problems. The patient comes to the visit today with her husband, who has been helping her with dressing her leg at home. She has just about finished the antibiotic she was started on. She says that her leg during the day it is slightly more red distally but on first observation today there is no distal erythema.         Patient Active Problem List   Diagnosis Date Noted  . Proximal humerus fracture 05/30/2011  . CHEST PAIN, PRECORDIAL 02/26/2010  . HYPERLIPIDEMIA 02/23/2010  . DEPRESSION 02/23/2010  . GLAUCOMA 02/23/2010  . CATARACTS 02/23/2010  . RHEUMATIC FEVER 02/23/2010  . HYPERTENSION 02/23/2010  . MITRAL VALVE PROLAPSE 02/23/2010  . GERD 02/23/2010  . OSTEOARTHRITIS 02/23/2010  . FIBROMYALGIA 02/23/2010   Outpatient Encounter Prescriptions as of 02/04/2014  Medication Sig  . aspirin 81 MG tablet Take 1 tablet (81 mg total) by mouth every other day.  Marland Kitchen atenolol (TENORMIN) 50 MG tablet TAKE 1 TABLET DAILY  . cephALEXin (KEFLEX) 500 MG capsule Take 1 capsule (500 mg total) by mouth 2 (two) times daily.  . Cholecalciferol (VITAMIN D3) 1000 UNITS CAPS Take 1 capsule by mouth daily.    . clorazepate (TRANXENE) 7.5 MG tablet TAKE 1 TABLET DAILY AS NEEDED  . DULoxetine (CYMBALTA) 60 MG capsule   . fluticasone (FLONASE) 50 MCG/ACT nasal spray Place 2 sprays into the nose daily.  . hydrocortisone (PROCTOZONE-HC) 2.5 % rectal cream Place 1 application rectally 2 (two) times daily as needed. For constipation  . ibuprofen (ADVIL,MOTRIN) 200 MG tablet Take 200 mg by mouth every 6 (six) hours as needed.  . Multiple Vitamins-Minerals (SENIOR MULTIVITAMIN PLUS PO) Take by mouth daily.    . Omeprazole Magnesium (PRILOSEC OTC PO) Take 1 capsule by mouth daily. For acid reflux  . simvastatin (ZOCOR) 80 MG tablet TAKE ONE TABLET AT  BEDTIME  . TRAVATAN Z 0.004 % SOLN ophthalmic solution INSTILL 1 DROP INTO RIGHT EYE AT BEDTIME  . venlafaxine XR (EFFEXOR-XR) 75 MG 24 hr capsule Take 75 mg by mouth daily with breakfast.   . Vitamin B1-B12 (NEURO B-12 IJ) Inject 1 vial as directed every 30 (thirty) days.   Marland Kitchen zolpidem (AMBIEN) 10 MG tablet TAKE 1/2 TABLET AT BEDTIME AS NEEDED FOR SLEEP    Review of Systems  Constitutional: Negative.   HENT: Negative.   Eyes: Negative.   Respiratory: Negative.   Cardiovascular: Negative.   Gastrointestinal: Negative.   Endocrine: Negative.   Genitourinary: Negative.   Musculoskeletal: Negative.   Skin: Positive for wound (laceration left leg).  Allergic/Immunologic: Negative.   Neurological: Negative.   Hematological: Negative.   Psychiatric/Behavioral: Negative.        Objective:   Physical Exam  Nursing note and vitals reviewed. Constitutional: She appears well-developed and well-nourished. She appears distressed.  Eyes: Conjunctivae are normal. Pupils are equal, round, and reactive to light. Right eye exhibits no discharge. Left eye exhibits no discharge.  Neck: Normal range of motion.  Musculoskeletal: Normal range of motion. She exhibits no edema.  There was minimal if any edema distal to the left leg wound   Skin: No rash noted.  The small amount of skin that was covering the wound seems to be retracting and basically we does have a wound that is open that would  have to heal and from its edges. There is minimal erythema around the wound. The area was cleaned with Betadine solution gently and gently debrided. A saline soaked gauze was applied with overlying dry gauze and netting to hold the dressing in place.  Psychiatric: She has a normal mood and affect. Her behavior is normal.   BP 127/70  Pulse 76  Temp(Src) 98.3 F (36.8 C) (Oral)  Ht 5\' 5"  (1.651 m)  Wt 185 lb (83.915 kg)  BMI 30.79 kg/m2        Assessment & Plan:  1. Leg laceration, left, subsequent  encounter   Patient Instructions  Continue daily dressings with also leaving the leg up into the air periodically during the day. Continue to Clean the leg gently with Betadine solution and apply saline gauze to the wound with overlying gauze and kling. If there is any increasing redness or erythema call us back sooner Otherwise return to clinic in one week to recheck the wound and do a dressing here   Arrie Senate MD

## 2014-02-11 ENCOUNTER — Encounter: Payer: Self-pay | Admitting: Family Medicine

## 2014-02-11 ENCOUNTER — Ambulatory Visit (INDEPENDENT_AMBULATORY_CARE_PROVIDER_SITE_OTHER): Payer: Medicare Other | Admitting: Family Medicine

## 2014-02-11 VITALS — BP 139/74 | HR 62 | Temp 97.4°F | Ht 65.0 in | Wt 185.0 lb

## 2014-02-11 DIAGNOSIS — S81809A Unspecified open wound, unspecified lower leg, initial encounter: Secondary | ICD-10-CM

## 2014-02-11 DIAGNOSIS — S81009A Unspecified open wound, unspecified knee, initial encounter: Secondary | ICD-10-CM

## 2014-02-11 DIAGNOSIS — S81812D Laceration without foreign body, left lower leg, subsequent encounter: Secondary | ICD-10-CM

## 2014-02-11 DIAGNOSIS — Z5189 Encounter for other specified aftercare: Secondary | ICD-10-CM

## 2014-02-11 DIAGNOSIS — S91009A Unspecified open wound, unspecified ankle, initial encounter: Secondary | ICD-10-CM

## 2014-02-11 NOTE — Progress Notes (Signed)
Subjective:    Patient ID: [name], female    DOB: [birth year], 73 y.o.   MRN: [id number]  HPI Patient here today for 1 week rck of left lower leg laceration.       Patient Active Problem List   Diagnosis Date Noted  . Proximal humerus fracture 05/30/2011  . CHEST PAIN, PRECORDIAL 02/26/2010  . HYPERLIPIDEMIA 02/23/2010  . DEPRESSION 02/23/2010  . GLAUCOMA 02/23/2010  . CATARACTS 02/23/2010  . RHEUMATIC FEVER 02/23/2010  . HYPERTENSION 02/23/2010  . MITRAL VALVE PROLAPSE 02/23/2010  . GERD 02/23/2010  . OSTEOARTHRITIS 02/23/2010  . FIBROMYALGIA 02/23/2010   Outpatient Encounter Prescriptions as of 02/11/2014  Medication Sig  . aspirin 81 MG tablet Take 1 tablet (81 mg total) by mouth every other day.  Marland Kitchen atenolol (TENORMIN) 50 MG tablet TAKE 1 TABLET DAILY  . Cholecalciferol (VITAMIN D3) 1000 UNITS CAPS Take 1 capsule by mouth daily.    . clorazepate (TRANXENE) 7.5 MG tablet TAKE 1 TABLET DAILY AS NEEDED  . DULoxetine (CYMBALTA) 60 MG capsule   . fluticasone (FLONASE) 50 MCG/ACT nasal spray Place 2 sprays into the nose daily.  . hydrocortisone (PROCTOZONE-HC) 2.5 % rectal cream Place 1 application rectally 2 (two) times daily as needed. For constipation  . ibuprofen (ADVIL,MOTRIN) 200 MG tablet Take 200 mg by mouth every 6 (six) hours as needed.  . Multiple Vitamins-Minerals (SENIOR MULTIVITAMIN PLUS PO) Take by mouth daily.    . Omeprazole Magnesium (PRILOSEC OTC PO) Take 1 capsule by mouth daily. For acid reflux  . simvastatin (ZOCOR) 80 MG tablet TAKE ONE TABLET AT BEDTIME  . TRAVATAN Z 0.004 % SOLN ophthalmic solution INSTILL 1 DROP INTO RIGHT EYE AT BEDTIME  . venlafaxine XR (EFFEXOR-XR) 75 MG 24 hr capsule Take 75 mg by mouth daily with breakfast.   . Vitamin B1-B12 (NEURO B-12 IJ) Inject 1 vial as directed every 30 (thirty) days.   Marland Kitchen zolpidem (AMBIEN) 10 MG tablet TAKE 1/2 TABLET AT BEDTIME AS NEEDED FOR SLEEP  . [DISCONTINUED] cephALEXin (KEFLEX) 500 MG  capsule Take 1 capsule (500 mg total) by mouth 2 (two) times daily.    Review of Systems  Constitutional: Negative.   HENT: Negative.   Eyes: Negative.   Respiratory: Negative.   Cardiovascular: Negative.   Gastrointestinal: Negative.   Endocrine: Negative.   Genitourinary: Negative.   Musculoskeletal: Negative.   Skin: Positive for wound (left lower leg- rck of area).  Allergic/Immunologic: Negative.   Neurological: Negative.   Hematological: Negative.   Psychiatric/Behavioral: Negative.        Objective:   Physical Exam  Nursing note and vitals reviewed. Constitutional: She is oriented to person, place, and time. She appears well-developed and well-nourished. No distress.  Musculoskeletal: Normal range of motion. She exhibits no edema.  Neurological: She is alert and oriented to person, place, and time.  Skin: Skin is warm and dry.  The leg was continues to have an open abrasion type wound which will have to heal in on its own from the edges, it remains about silver dollar size. There is minimal surrounding erythema. The skin that was attached with sutures was obviously not big enough to adhere  to heal the wound   Psychiatric: She has a normal mood and affect. Her behavior is normal. Judgment and thought content normal.   BP 139/74  Pulse 62  Temp(Src) 97.4 F (36.3 C) (Oral)  Ht 5\' 5"  (1.651 m)  Wt 185 lb (83.915 kg)  BMI 30.79  kg/m2        Assessment & Plan:  1. Leg laceration, left, subsequent encounter Patient Instructions  Continued to clean the wound 3 times daily and apply a wet-to-dry saline dressing Elevate the leg when possible and periodic leg the wound be opened to fresh air   Arrie Senate MD

## 2014-02-11 NOTE — Patient Instructions (Signed)
Continued to clean the wound 3 times daily and apply a wet-to-dry saline dressing Elevate the leg when possible and periodic leg the wound be opened to fresh air

## 2014-02-14 ENCOUNTER — Other Ambulatory Visit: Payer: Self-pay | Admitting: *Deleted

## 2014-02-14 MED ORDER — HYDROCORTISONE 2.5 % RE CREA: 1.0000 "application " | TOPICAL_CREAM | Freq: Two times a day (BID) | RECTAL | Status: DC | PRN

## 2014-02-18 ENCOUNTER — Ambulatory Visit (INDEPENDENT_AMBULATORY_CARE_PROVIDER_SITE_OTHER): Payer: Medicare Other | Admitting: Family Medicine

## 2014-02-18 ENCOUNTER — Encounter: Payer: Self-pay | Admitting: Family Medicine

## 2014-02-18 VITALS — BP 142/68 | HR 76 | Temp 96.5°F | Ht 65.0 in | Wt 187.0 lb

## 2014-02-18 DIAGNOSIS — IMO0002 Reserved for concepts with insufficient information to code with codable children: Secondary | ICD-10-CM

## 2014-02-18 DIAGNOSIS — S81812D Laceration without foreign body, left lower leg, subsequent encounter: Secondary | ICD-10-CM

## 2014-02-18 DIAGNOSIS — S81802S Unspecified open wound, left lower leg, sequela: Secondary | ICD-10-CM

## 2014-02-18 DIAGNOSIS — S91009A Unspecified open wound, unspecified ankle, initial encounter: Secondary | ICD-10-CM

## 2014-02-18 DIAGNOSIS — Z5189 Encounter for other specified aftercare: Secondary | ICD-10-CM

## 2014-02-18 DIAGNOSIS — E538 Deficiency of other specified B group vitamins: Secondary | ICD-10-CM

## 2014-02-18 DIAGNOSIS — S81809A Unspecified open wound, unspecified lower leg, initial encounter: Secondary | ICD-10-CM

## 2014-02-18 DIAGNOSIS — S81009A Unspecified open wound, unspecified knee, initial encounter: Secondary | ICD-10-CM

## 2014-02-18 NOTE — Progress Notes (Signed)
Subjective:    Patient ID: [name], female    DOB: 08-Oct-1940, 73 y.o.   MRN: [id number]  HPI Patient here today for 1 week follow up on left lower leg laceration. We are now almost 5 weeks out from the initial injury and laceration to the patient's left lower leg. The repair of the laceration did not heal and we are  subsequently waiting for the wound to heal in on its own. This progress has been very slow. The wound today measured about 3 cm x 2.5 cm. It is located just proximal to an older wound from the past. The husband has been dressing the wound daily and keeping it clean with Betadine solution and using saline wet to dry. There appears to be little progress in the healing. There is no increasing erythema just the wound itself. The granulation tissue looks very healthy.        Patient Active Problem List   Diagnosis Date Noted  . Proximal humerus fracture 05/30/2011  . CHEST PAIN, PRECORDIAL 02/26/2010  . HYPERLIPIDEMIA 02/23/2010  . DEPRESSION 02/23/2010  . GLAUCOMA 02/23/2010  . CATARACTS 02/23/2010  . RHEUMATIC FEVER 02/23/2010  . HYPERTENSION 02/23/2010  . MITRAL VALVE PROLAPSE 02/23/2010  . GERD 02/23/2010  . OSTEOARTHRITIS 02/23/2010  . FIBROMYALGIA 02/23/2010   Outpatient Encounter Prescriptions as of 02/18/2014  Medication Sig  . aspirin 81 MG tablet Take 1 tablet (81 mg total) by mouth every other day.  Marland Kitchen atenolol (TENORMIN) 50 MG tablet TAKE 1 TABLET DAILY  . Cholecalciferol (VITAMIN D3) 1000 UNITS CAPS Take 1 capsule by mouth daily.    . clorazepate (TRANXENE) 7.5 MG tablet TAKE 1 TABLET DAILY AS NEEDED  . DULoxetine (CYMBALTA) 60 MG capsule   . fluticasone (FLONASE) 50 MCG/ACT nasal spray Place 2 sprays into the nose daily.  . hydrocortisone (PROCTOZONE-HC) 2.5 % rectal cream Place 1 application rectally 2 (two) times daily as needed. For constipation  . ibuprofen (ADVIL,MOTRIN) 200 MG tablet Take 200 mg by mouth every 6 (six) hours as needed.  .  Multiple Vitamins-Minerals (SENIOR MULTIVITAMIN PLUS PO) Take by mouth daily.    . Omeprazole Magnesium (PRILOSEC OTC PO) Take 1 capsule by mouth daily. For acid reflux  . simvastatin (ZOCOR) 80 MG tablet TAKE ONE TABLET AT BEDTIME  . TRAVATAN Z 0.004 % SOLN ophthalmic solution INSTILL 1 DROP INTO RIGHT EYE AT BEDTIME  . venlafaxine XR (EFFEXOR-XR) 75 MG 24 hr capsule Take 75 mg by mouth daily with breakfast.   . Vitamin B1-B12 (NEURO B-12 IJ) Inject 1 vial as directed every 30 (thirty) days.   Marland Kitchen zolpidem (AMBIEN) 10 MG tablet TAKE 1/2 TABLET AT BEDTIME AS NEEDED FOR SLEEP    Review of Systems  Constitutional: Negative.   HENT: Negative.   Eyes: Negative.   Respiratory: Negative.   Cardiovascular: Negative.   Gastrointestinal: Negative.   Endocrine: Negative.   Genitourinary: Negative.   Musculoskeletal: Negative.   Skin: Positive for wound (left lower leg laceration).  Allergic/Immunologic: Negative.   Neurological: Negative.   Hematological: Negative.   Psychiatric/Behavioral: Negative.        Objective:   Physical Exam  Constitutional: She is oriented to person, place, and time. She appears well-developed and well-nourished. No distress.  Musculoskeletal: Normal range of motion. She exhibits no edema.  Neurological: She is alert and oriented to person, place, and time.  Skin: Skin is warm.  There is a 3 cm x 2.5 cm wound of the left lower  leg just above a previous wound. The tissue looks healthy. There is no surrounding erythema.  Psychiatric: She has a normal mood and affect. Her behavior is normal. Judgment and thought content normal.   BP 142/68  Pulse 76  Temp(Src) 96.5 F (35.8 C) (Oral)  Ht 5\' 5"  (1.651 m)  Wt 187 lb (84.823 kg)  BMI 31.12 kg/m2        Assessment & Plan:  1. Leg laceration, left, subsequent encounter  2. Open wound of left lower leg, sequela  Patient Instructions  We will make arrangements for you to visit the wound Center in  [place]. In the meantime continue with saline compresses and Betadine cleansings as we have been doing.   Arrie Senate MD

## 2014-02-18 NOTE — Patient Instructions (Signed)
We will make arrangements for you to visit the wound Center in [place]. In the meantime continue with saline compresses and Betadine cleansings as we have been doing.

## 2014-02-19 ENCOUNTER — Ambulatory Visit (INDEPENDENT_AMBULATORY_CARE_PROVIDER_SITE_OTHER): Payer: Medicare Other

## 2014-02-19 ENCOUNTER — Ambulatory Visit (INDEPENDENT_AMBULATORY_CARE_PROVIDER_SITE_OTHER): Payer: Medicare Other | Admitting: Family Medicine

## 2014-02-19 VITALS — BP 151/88 | HR 109 | Temp 97.5°F | Ht 65.0 in | Wt 187.0 lb

## 2014-02-19 DIAGNOSIS — IMO0002 Reserved for concepts with insufficient information to code with codable children: Secondary | ICD-10-CM

## 2014-02-19 DIAGNOSIS — M25569 Pain in unspecified knee: Secondary | ICD-10-CM

## 2014-02-19 DIAGNOSIS — S8000XA Contusion of unspecified knee, initial encounter: Secondary | ICD-10-CM

## 2014-02-19 DIAGNOSIS — S51009A Unspecified open wound of unspecified elbow, initial encounter: Secondary | ICD-10-CM

## 2014-02-19 DIAGNOSIS — S81812S Laceration without foreign body, left lower leg, sequela: Secondary | ICD-10-CM

## 2014-02-19 DIAGNOSIS — S51012A Laceration without foreign body of left elbow, initial encounter: Secondary | ICD-10-CM

## 2014-02-19 DIAGNOSIS — S8001XA Contusion of right knee, initial encounter: Secondary | ICD-10-CM

## 2014-02-19 DIAGNOSIS — S86922S Laceration of unspecified muscle(s) and tendon(s) at lower leg level, left leg, sequela: Secondary | ICD-10-CM

## 2014-02-19 DIAGNOSIS — M25562 Pain in left knee: Secondary | ICD-10-CM

## 2014-02-19 DIAGNOSIS — M25561 Pain in right knee: Secondary | ICD-10-CM

## 2014-02-19 MED ORDER — CEPHALEXIN 500 MG PO CAPS: 500.0000 mg | ORAL_CAPSULE | Freq: Two times a day (BID) | ORAL | Status: DC

## 2014-02-19 NOTE — Progress Notes (Signed)
Subjective:    Patient ID: [name], female    DOB: [birth year], 73 y.o.   MRN: [id number]  HPI Patient here this afternoon for fall. She fell at home on her right knee and hit her left arm and left ankle on the screen door. She is already being treated for a wound to the left leg above scar tissue in the left leg. She currently has a referral in to the wound center.         Patient Active Problem List   Diagnosis Date Noted  . Proximal humerus fracture 05/30/2011  . CHEST PAIN, PRECORDIAL 02/26/2010  . HYPERLIPIDEMIA 02/23/2010  . DEPRESSION 02/23/2010  . GLAUCOMA 02/23/2010  . CATARACTS 02/23/2010  . RHEUMATIC FEVER 02/23/2010  . HYPERTENSION 02/23/2010  . MITRAL VALVE PROLAPSE 02/23/2010  . GERD 02/23/2010  . OSTEOARTHRITIS 02/23/2010  . FIBROMYALGIA 02/23/2010   Outpatient Encounter Prescriptions as of 02/19/2014  Medication Sig  . aspirin 81 MG tablet Take 1 tablet (81 mg total) by mouth every other day.  Marland Kitchen atenolol (TENORMIN) 50 MG tablet TAKE 1 TABLET DAILY  . Cholecalciferol (VITAMIN D3) 1000 UNITS CAPS Take 1 capsule by mouth daily.    . clorazepate (TRANXENE) 7.5 MG tablet TAKE 1 TABLET DAILY AS NEEDED  . DULoxetine (CYMBALTA) 60 MG capsule   . fluticasone (FLONASE) 50 MCG/ACT nasal spray Place 2 sprays into the nose daily.  . hydrocortisone (PROCTOZONE-HC) 2.5 % rectal cream Place 1 application rectally 2 (two) times daily as needed. For constipation  . ibuprofen (ADVIL,MOTRIN) 200 MG tablet Take 200 mg by mouth every 6 (six) hours as needed.  . Multiple Vitamins-Minerals (SENIOR MULTIVITAMIN PLUS PO) Take by mouth daily.    . Omeprazole Magnesium (PRILOSEC OTC PO) Take 1 capsule by mouth daily. For acid reflux  . simvastatin (ZOCOR) 80 MG tablet TAKE ONE TABLET AT BEDTIME  . TRAVATAN Z 0.004 % SOLN ophthalmic solution INSTILL 1 DROP INTO RIGHT EYE AT BEDTIME  . venlafaxine XR (EFFEXOR-XR) 75 MG 24 hr capsule Take 75 mg by mouth daily with breakfast.   .  Vitamin B1-B12 (NEURO B-12 IJ) Inject 1 vial as directed every 30 (thirty) days.   Marland Kitchen zolpidem (AMBIEN) 10 MG tablet TAKE 1/2 TABLET AT BEDTIME AS NEEDED FOR SLEEP    Review of Systems  Constitutional: Negative.   HENT: Negative.   Eyes: Negative.   Respiratory: Negative.   Cardiovascular: Negative.   Gastrointestinal: Negative.   Endocrine: Negative.   Genitourinary: Negative.   Musculoskeletal: Positive for arthralgias (right knee pain).  Skin: Positive for wound (left mid arm/elbow skin tear andleft ankle skin tear).  Allergic/Immunologic: Negative.   Neurological: Negative.   Hematological: Negative.   Psychiatric/Behavioral: Negative.        Objective:   Physical Exam BP 151/88  Pulse 109  Temp(Src) 97.5 F (36.4 C) (Oral)  Ht 5\' 5"  (1.651 m)  Wt 187 lb (84.823 kg)  BMI 31.12 kg/m2  [facility] reading (PRIMARY) by  DrMoore-right knee--knee replacement with no acute injury noted  The patient is alert and talking and moving extremities well. There was no head injury. She has a large contusion of her right knee with bruising. The x-ray was reviewed and everything appears okay as far as the joint replacement is concerned. There was some stiffness in the right knee secondary to the contusion.  The patient has a large skin tear around the left elbow and some of the skin was actually avulsed. After about an hour  of working this was pulled back together with multiple Steri-Strips. A dressing was applied to the entire area with Adaptic and a pressure dressing.. multiple Steri-Strips were used to pull the wound back together. The lower part of the wound did not have any tissue to pull together.  There is also a slight skin tear at the left ankle. A Band-Aid with a pressure dressing was applied to this area                                           Assessment & Plan:  1. Right knee pain - DG Knee 1-2 Views Left; Future - Ambulatory referral to Pain Clinic  2. Lower leg  laceration involving tendon, left, sequela - Ambulatory referral to Pain Clinic  3. Skin tear of left elbow without complication, initial encounter - Ambulatory referral to Pain Clinic  4. Knee contusion, right, initial encounter   Meds ordered this encounter  Medications  . cephALEXin (KEFLEX) 500 MG capsule    Sig: Take 1 capsule (500 mg total) by mouth 2 (two) times daily.    Dispense:  20 capsule    Refill:  0   Patient Instructions  Ice the right knee off and on for the next 48 hours Try to refrain from fully flexing the left elbow and arm Leavitt dressing and Steri-Strips as they currently are without changing the dressing. Return to clinic in 6 days for recheck if the appointment to the wound center does not occur first Take the antibiotic as directed Please be careful and do not sustain any more falls!   Arrie Senate MD

## 2014-02-19 NOTE — Patient Instructions (Signed)
Ice the right knee off and on for the next 48 hours Try to refrain from fully flexing the left elbow and arm Leavitt dressing and Steri-Strips as they currently are without changing the dressing. Return to clinic in 6 days for recheck if the appointment to the wound center does not occur first Take the antibiotic as directed Please be careful and do not sustain any more falls!

## 2014-02-20 ENCOUNTER — Telehealth: Payer: Self-pay

## 2014-02-20 NOTE — Telephone Encounter (Signed)
Pt aware of knee xray results 

## 2014-02-21 ENCOUNTER — Ambulatory Visit: Payer: Medicare Other | Admitting: Family Medicine

## 2014-02-21 ENCOUNTER — Ambulatory Visit (HOSPITAL_COMMUNITY)
Admission: RE | Admit: 2014-02-21 | Discharge: 2014-02-21 | Disposition: A | Discharge status: Home / Self Care | Payer: Medicare Other | Source: Ambulatory Visit | Attending: Family Medicine | Admitting: Family Medicine

## 2014-02-21 DIAGNOSIS — IMO0001 Reserved for inherently not codable concepts without codable children: Secondary | ICD-10-CM | POA: Insufficient documentation

## 2014-02-21 DIAGNOSIS — S81009A Unspecified open wound, unspecified knee, initial encounter: Secondary | ICD-10-CM | POA: Diagnosis not present

## 2014-02-21 DIAGNOSIS — S91009A Unspecified open wound, unspecified ankle, initial encounter: Secondary | ICD-10-CM

## 2014-02-21 DIAGNOSIS — S81809A Unspecified open wound, unspecified lower leg, initial encounter: Secondary | ICD-10-CM | POA: Diagnosis not present

## 2014-02-21 NOTE — Progress Notes (Signed)
Physical Therapy - Wound Therapy  Evaluation   Patient Details  Name: [name] MRN: [id number] Date of Birth: [birth year]  Today's Date: 02/21/2014 Time: 5631-4970 Time Calculation (min): 45 min  Visit#: 1 of 8  Re-eval: 03/23/14  Subjective Subjective Assessment Subjective: Pt states that she she was at [facility] on 01/16/2014 when a stool fell off the shelf and hit her leg causing a wound.   The pt has been on two sets of antibiotic and has been caring for the wound at home but it does not want to heal.  She states she initially had ten stitches so the wound is doing better it just will not totally heal.  She then fell at home on 02/19/2014 causing a laceration to her Lt arm, ( this wound has just been seen this AM by the MD , it has been cleansed, closed with steri strips and dressed therefore therapy will wait until Monday to assess whether or not this wound will need therapy).   Patient and Family Stated Goals: wounds to heal  Date of Onset: 01/16/14  Pain Assessment Pain Assessment Pain Assessment: 0-10 Pain Score: 3  Pain Location: Leg Pain Intervention(s): Emotional support  Wound Therapy Wound / Incision (Open or Dehisced) 02/21/14 Laceration Leg Left;Lateral (Active)  Dressing Type Hydrogel;Silver dressings;Compression wrap 02/21/2014  3:10 PM  Dressing Changed New 02/21/2014  3:00 PM  Dressing Status Clean 02/21/2014  3:00 PM  Dressing Change Frequency Every 3 days 02/21/2014  3:00 PM  Site / Wound Assessment Brown 02/21/2014  3:00 PM  % Wound base Red or Granulating 5% 02/21/2014  3:00 PM  % Wound base Yellow 95% 02/21/2014  3:00 PM  Peri-wound Assessment Edema;Erythema (blanchable) 02/21/2014  3:00 PM  Wound Length (cm) 2.5 cm 02/21/2014  3:00 PM  Wound Width (cm) 2 cm 02/21/2014  3:00 PM  Wound Depth (cm) 0.2 cm 02/21/2014  3:00 PM  Margins Unattached edges (unapproximated) 02/21/2014  3:00 PM  Closure None 02/21/2014  3:00 PM  Drainage Amount Scant 02/21/2014  3:00 PM   Drainage Description Serosanguineous 02/21/2014  3:00 PM  Non-staged Wound Description Full thickness 02/21/2014  3:00 PM  Treatment Cleansed;Debridement (Selective) 02/21/2014  3:00 PM     Incision 05/30/11 Arm Left (Active)     Incision 05/30/11 Leg Left (Active)   Selective Debridement Selective Debridement - Location: entire wound bed Selective Debridement - Tools Used: Forceps Selective Debridement - Tissue Removed: forceps    Physical Therapy Assessment and Plan Wound Therapy - Assess/Plan/Recommendations Wound Therapy - Clinical Statement: Pt is a 73 yo female who has had a non-healing wound since 01/16/2014 despite two bouts of antibiotics.  Pt will benefit from skilled wound therapy to promote healing.  Pt has a new wound to her Lt arm which may or may not need therapy intervention we will assess this wound at next treatment session.  Pt has been placed on a third bout of antibiotics for the most recent wound. Factors Delaying/Impairing Wound Healing: Infection - systemic/local;Vascular compromise Hydrotherapy Plan: Debridement;Patient/family education;Dressing change Wound Therapy - Frequency:  (2x/week) Wound Therapy - Current Recommendations: PT Wound Plan: Pt to have debridement followed by silver and compression wrapping to promote wound healing.       Goals Wound Therapy Goals - Improve the function of patient's integumentary system by progressing the wound(s) through the phases of wound healing by: Decrease Necrotic Tissue to: 0 Increase Granulation Tissue to: 100 Decrease Length/Width/Depth by (cm): 2.0x 1.5x.1 Improve Drainage Characteristics:  (scant)  Patient/Family will be able to : walk without pain  Time For Goal Achievement:  (4 week) Wound Therapy - Potential for Goals: Good  Problem List Patient Active Problem List   Diagnosis Date Noted  . Proximal humerus fracture 05/30/2011  . CHEST PAIN, PRECORDIAL 02/26/2010  . HYPERLIPIDEMIA 02/23/2010  . DEPRESSION  02/23/2010  . GLAUCOMA 02/23/2010  . CATARACTS 02/23/2010  . RHEUMATIC FEVER 02/23/2010  . HYPERTENSION 02/23/2010  . MITRAL VALVE PROLAPSE 02/23/2010  . GERD 02/23/2010  . OSTEOARTHRITIS 02/23/2010  . FIBROMYALGIA 02/23/2010    GP Functional Assessment Tool Used: clinical judgement Functional Limitation: Other PT primary Other PT Primary Current Status (E7209): At least 80 percent but less than 100 percent impaired, limited or restricted Other PT Primary Goal Status (O7096): At least 1 percent but less than 20 percent impaired, limited or restricted  RUSSELL,CINDY 02/21/2014, 3:13 PM Your signature is required to indicate approval of the treatment plan as stated above.  Please sign and return making a copy for your files.  You may hard copy or send electronically.  Please check one: ___1.  Approve of this plan  ___2.  Approve of this plan with the following changes.   ____________________________                             _____________ Physician                                                                      Date

## 2014-02-24 ENCOUNTER — Ambulatory Visit (HOSPITAL_COMMUNITY)
Admission: RE | Admit: 2014-02-24 | Discharge: 2014-02-24 | Disposition: A | Discharge status: Home / Self Care | Payer: Medicare Other | Source: Ambulatory Visit | Attending: Family Medicine | Admitting: Family Medicine

## 2014-02-24 DIAGNOSIS — IMO0001 Reserved for inherently not codable concepts without codable children: Secondary | ICD-10-CM | POA: Diagnosis not present

## 2014-02-24 NOTE — Progress Notes (Signed)
Physical Therapy - Wound Therapy  Treatment   Patient Details  Name: [name] MRN: [id number] Date of Birth: 03/12/1941  Today's Date: 02/24/2014 Time: 1110-1150 Time Calculation (min): 40 min Charge: selective debridement <20 cm  Visit#: 2 of 8  Re-eval: 03/23/14  Subjective Subjective Assessment Subjective: Pain scale 6/10, currently taking antibiotics  Pain Assessment Pain Assessment Pain Score: 6  Pain Location: Leg Pain Orientation: Left  Wound Therapy  02/24/14 1200  Wound / Incision (Open or Dehisced) 02/24/14 Laceration Arm Left  Date First Assessed/Time First Assessed: 02/24/14 1150   Wound Type: Laceration  Location: Arm  Location Orientation: Left  Dressing Type ABD;Gauze (Comment);Hydrogel;Impregnated gauze (petrolatum)  Dressing Changed New  Dressing Status Clean;Dry;Intact  Site / Wound Assessment Clean;Red  % Wound base Red or Granulating 90%  % Wound base Yellow 10%  % Wound base Black 0%  Wound Length (cm) 2 cm (2cm width with steri strips)  Wound Width (cm) 2 cm (2cm width with steri strips)  Wound Depth (cm) 0 cm  Drainage Amount Moderate  Drainage Description Serous  Treatment Cleansed  Wound / Incision (Open or Dehisced) 02/24/14 Laceration Arm Left  Date First Assessed/Time First Assessed: 02/21/14 1150   Wound Type: Laceration  Location: Leg  Location Orientation: Left;Lateral  Dressing Type Hydrogel;Silver dressings;Compression wrap (Profore lite)  Dressing Changed Changed  Dressing Status Clean  Site / Wound Assessment Red  % Wound base Red or Granulating 80%  % Wound base Yellow 20%  Margins Unattached edges (unapproximated)  Closure None  Drainage Amount Scant  Drainage Description Serosanguineous  Non-staged Wound Description Full thickness  Treatment Cleansed;Debridement (Selective)  Wound Therapy - Assess/Plan/Recommendations  Wound Therapy - Clinical Statement Measurement taken for Lt arm 2x 2cm plus the steristrips lefti  ntack.  Noted improved gramulation with LE and positive findings with silver dressings.  Selective debridement for removal of slough and dead skin to promote healing.  No c/o increased pain through treatment.  Continued with silver and profore dressings for edema control.    Wound Plan Continue with skilled intervention for Lt arm and LE wound care.          Physical Therapy Assessment and Plan Wound Therapy - Assess/Plan/Recommendations Wound Therapy - Clinical Statement: Measurement taken for Lt arm 2x 2cm plus the steristrips lefti ntack.  Noted improved gramulation with LE and positive findings with silver dressings.  Selective debridement for removal of slough and dead skin to promote healing.  No c/o increased pain through treatment.  Continued with silver and profore dressings for edema control.   Wound Plan: Continue with skilled intervention for Lt arm and LE wound care.        Goals    Problem List Patient Active Problem List   Diagnosis Date Noted  . Proximal humerus fracture 05/30/2011  . CHEST PAIN, PRECORDIAL 02/26/2010  . HYPERLIPIDEMIA 02/23/2010  . DEPRESSION 02/23/2010  . GLAUCOMA 02/23/2010  . CATARACTS 02/23/2010  . RHEUMATIC FEVER 02/23/2010  . HYPERTENSION 02/23/2010  . MITRAL VALVE PROLAPSE 02/23/2010  . GERD 02/23/2010  . OSTEOARTHRITIS 02/23/2010  . FIBROMYALGIA 02/23/2010    GP    Aldona Lento 02/24/2014, 12:23 PM

## 2014-02-25 ENCOUNTER — Ambulatory Visit: Payer: Medicare Other | Admitting: Family Medicine

## 2014-02-28 ENCOUNTER — Ambulatory Visit (HOSPITAL_COMMUNITY)
Admission: RE | Admit: 2014-02-28 | Discharge: 2014-02-28 | Disposition: A | Discharge status: Home / Self Care | Payer: Medicare Other | Source: Ambulatory Visit | Attending: Family Medicine | Admitting: Family Medicine

## 2014-02-28 DIAGNOSIS — Z4800 Encounter for change or removal of nonsurgical wound dressing: Secondary | ICD-10-CM | POA: Insufficient documentation

## 2014-02-28 DIAGNOSIS — IMO0001 Reserved for inherently not codable concepts without codable children: Secondary | ICD-10-CM | POA: Diagnosis present

## 2014-02-28 NOTE — Progress Notes (Signed)
Physical Therapy - Wound Therapy  Treatment   Patient Details  Name: [name] MRN: [id number] Date of Birth: 11-26-1940  Today's Date: 02/28/2014 Time: 4696-2952 Time Calculation (min): 38 min Charge: selective debridement <20 cm  Visit#: 3 of 8  Re-eval: 03/23/14  Subjective Subjective Assessment Subjective: Pain scale 7/10, dressings intact  Pain Assessment Pain Assessment Pain Score: 7  Pain Location: Leg Pain Orientation: Left  Wound Therapy  02/28/14 1139  Subjective Assessment  Subjective Pain scale 7/10, dressings intact  Wound / Incision (Open or Dehisced)  Date First Assessed/Time First Assessed: 02/24/14 1150   Wound Type: Laceration  Location: Arm  Location Orientation: Left  Dressing Type ABD;Gauze (Comment);Hydrogel;Impregnated gauze (petrolatum)  Dressing Changed New  Site / Wound Assessment Clean;Red  % Wound base Red or Granulating 90%  % Wound base Yellow 10%  Wound Length (cm) 2 cm  Wound Width (cm) 3.5 cm (steri strips removed with dressing change)  Drainage Amount Minimal  Drainage Description Serous  Treatment Cleansed;Debridement (Selective)  Wound / Incision (Open or Dehisced)  Date First Assessed/Time First Assessed: 02/21/14 1150   Wound Type: Laceration  Location: Leg  Location Orientation: Left;Lateral  Dressing Type Hydrogel;Silver dressings;Compression wrap (Coban 2 lite)  Dressing Changed Changed  Dressing Status Clean;Dry;Intact  Site / Wound Assessment Red  % Wound base Red or Granulating 90%  % Wound base Yellow 10%  Margins Unattached edges (unapproximated)  Closure None  Drainage Amount Scant  Drainage Description Serosanguineous  Non-staged Wound Description Full thickness  Treatment Cleansed;Debridement (Selective)  Selective Debridement  Selective Debridement - Location entire wound bed  Selective Debridement - Tools Used Forceps  Selective Debridement - Tissue Removed slough and dryskin perimeter of wound  Wound  Therapy - Assess/Plan/Recommendations  Wound Therapy - Clinical Statement Some steri strips removed with removal of dressings, remeasured UE wound size.  Improved granulation noted following cleansing/debridement for remoal of slough and dead skin around perimeter to promote healing.  No reports of increased pain through session.  Pt stated she continues with antibiodic meds.  Noted decreased redness perimeter or LE wound.  No signs of infections with wounds.    Wound Plan Continue with skilled intervention for Lt arm and LE wound care.      Selective Debridement Selective Debridement - Location: entire wound bed Selective Debridement - Tools Used: Forceps Selective Debridement - Tissue Removed: slough and dryskin perimeter of wound   Physical Therapy Assessment and Plan Wound Therapy - Assess/Plan/Recommendations Wound Therapy - Clinical Statement: Some steri strips removed with removal of dressings, remeasured UE wound size.  Improved granulation noted following cleansing/debridement for remoal of slough and dead skin around perimeter to promote healing.  No reports of increased pain through session.  Pt stated she continues with antibiodic meds.  Noted decreased redness perimeter or LE wound.  No signs of infections with wounds.   Wound Plan: Continue with skilled intervention for Lt arm and LE wound care.        Goals    Problem List Patient Active Problem List   Diagnosis Date Noted  . Proximal humerus fracture 05/30/2011  . CHEST PAIN, PRECORDIAL 02/26/2010  . HYPERLIPIDEMIA 02/23/2010  . DEPRESSION 02/23/2010  . GLAUCOMA 02/23/2010  . CATARACTS 02/23/2010  . RHEUMATIC FEVER 02/23/2010  . HYPERTENSION 02/23/2010  . MITRAL VALVE PROLAPSE 02/23/2010  . GERD 02/23/2010  . OSTEOARTHRITIS 02/23/2010  . FIBROMYALGIA 02/23/2010    GP    Aldona Lento 02/28/2014, 12:25 PM

## 2014-03-05 ENCOUNTER — Ambulatory Visit (HOSPITAL_COMMUNITY)
Admission: RE | Admit: 2014-03-05 | Discharge: 2014-03-05 | Disposition: A | Discharge status: Home / Self Care | Payer: Medicare Other | Source: Ambulatory Visit | Attending: Family Medicine | Admitting: Family Medicine

## 2014-03-05 DIAGNOSIS — IMO0001 Reserved for inherently not codable concepts without codable children: Secondary | ICD-10-CM | POA: Diagnosis not present

## 2014-03-05 NOTE — Progress Notes (Signed)
Physical Therapy - Wound Therapy  Treatment   Patient Details  Name: [name] MRN: [id number] Date of Birth: 03/24/1941  Today's Date: 03/05/2014 Time: 1110-1150 Time Calculation (min): 40 min Charge: selective debridement <20 cm  Visit#: 4 of 8  Re-eval: 03/23/14  Subjective Subjective Assessment Subjective: Pain scale 6/10, dressings intact pt has changed dressings on arm, reports what she thought were blisters starting on elbow.  Pain Assessment Pain Assessment Pain Score: 6  Pain Location: Leg Pain Orientation: Left  Wound Therapy  03/05/14 1400  Wound / Incision (Open or Dehisced)  Date First Assessed/Time First Assessed: 02/24/14 1150   Wound Type: Laceration  Location: Arm  Location Orientation: Left  Dressing Type Hydrogel;ABD;Gauze (Comment)  Dressing Changed New  Dressing Status Clean;Dry;Intact  Site / Wound Assessment Clean;Dry  % Wound base Red or Granulating 95%  % Wound base Yellow 5%  Drainage Amount Minimal  Drainage Description Serous  Treatment Cleansed;Debridement (Selective)  Wound / Incision (Open or Dehisced)  Date First Assessed/Time First Assessed: 02/21/14 1150   Wound Type: Laceration  Location: Leg  Location Orientation: Left;Lateral  Dressing Type Hydrogel;Silver dressings;Gauze (Comment);Compression wrap  Dressing Changed Changed  Dressing Status Clean;Dry;Intact  Site / Wound Assessment Red  % Wound base Red or Granulating 95%  % Wound base Yellow 5%  Margins Unattached edges (unapproximated)  Closure None  Drainage Amount Scant  Drainage Description Serosanguineous  Non-staged Wound Description Full thickness  Treatment Cleansed;Debridement (Selective)  Selective Debridement  Selective Debridement - Location entire wound bed  Selective Debridement - Tools Used Forceps  Selective Debridement - Tissue Removed slough and dryskin perimeter of wound  Wound Therapy - Assess/Plan/Recommendations  Wound Therapy - Clinical Statement  Steri strips removed with removal of dressings on Lt UE, noted vast improvement with granulation tissue.  No noted blisters as pt had spoken of on UE  Decreased draininag both wounds with less debridement necessary due to increased granulation tissues.  Continued with silver, gauze and compression to LE.  No reports of  pain through session.    Wound Plan Continue with skilled intervention for Lt arm and LE wound care.      Selective Debridement Selective Debridement - Location: entire wound bed Selective Debridement - Tools Used: Forceps Selective Debridement - Tissue Removed: slough and dryskin perimeter of wound   Physical Therapy Assessment and Plan Wound Therapy - Assess/Plan/Recommendations Wound Therapy - Clinical Statement: Steri strips removed with removal of dressings on Lt UE, noted vast improvement with granulation tissue.  No noted blisters as pt had spoken of on UE  Decreased draininag both wounds with less debridement necessary due to increased granulation tissues.  Continued with silver, gauze and compression to LE.  No reports of  pain through session.   Wound Plan: Continue with skilled intervention for Lt arm and LE wound care.        Goals    Problem List Patient Active Problem List   Diagnosis Date Noted  . Proximal humerus fracture 05/30/2011  . CHEST PAIN, PRECORDIAL 02/26/2010  . HYPERLIPIDEMIA 02/23/2010  . DEPRESSION 02/23/2010  . GLAUCOMA 02/23/2010  . CATARACTS 02/23/2010  . RHEUMATIC FEVER 02/23/2010  . HYPERTENSION 02/23/2010  . MITRAL VALVE PROLAPSE 02/23/2010  . GERD 02/23/2010  . OSTEOARTHRITIS 02/23/2010  . FIBROMYALGIA 02/23/2010    GP    Aldona Lento 03/05/2014, 2:11 PM

## 2014-03-07 ENCOUNTER — Ambulatory Visit (HOSPITAL_COMMUNITY)
Admission: RE | Admit: 2014-03-07 | Discharge: 2014-03-07 | Disposition: A | Discharge status: Home / Self Care | Payer: Medicare Other | Source: Ambulatory Visit | Attending: Family Medicine | Admitting: Family Medicine

## 2014-03-07 DIAGNOSIS — IMO0001 Reserved for inherently not codable concepts without codable children: Secondary | ICD-10-CM | POA: Diagnosis not present

## 2014-03-07 NOTE — Progress Notes (Signed)
Physical Therapy - Wound Therapy  Treatment   Patient Details  Name: [name] MRN: [id number] Date of Birth: 26-Jul-1940  Today's Date: 03/07/2014 Time: 5364-6803 Time Calculation (min): 35 min Charge: selective debridement <20 cm  Visit#: 5 of 8  Re-eval: 03/23/14  Subjective Subjective Assessment Subjective: Pt stated Lt arm is itchy, pain tolerable in Lt LE.  Current pain scale 4-5/10.  Dressings are intact.  Pain Assessment Pain Assessment Pain Score: 5  Pain Location: Leg Pain Orientation: Left  Wound Therapy  03/07/14 1308  Subjective Assessment  Subjective Pt stated Lt arm is itchy, pain tolerable in Lt LE.  Current pain scale 4-5/10.  Dressings are intact.  Wound / Incision (Open or Dehisced)  Date First Assessed/Time First Assessed: 02/24/14 1150   Wound Type: Laceration  Location: Arm  Location Orientation: Left  Dressing Type Hydrogel;Gauze (Comment)  Dressing Changed New  Dressing Status Clean;Dry;Intact  Site / Wound Assessment Clean;Dry  % Wound base Red or Granulating 95%  % Wound base Yellow 5%  % Wound base Black 0%  Drainage Amount Minimal  Drainage Description Serous  Treatment Cleansed;Debridement (Selective)  Wound / Incision (Open or Dehisced)  Date First Assessed/Time First Assessed: 02/21/14 1150   Wound Type: Laceration  Location: Leg  Location Orientation: Left;Lateral  Dressing Type Hydrogel;Silver dressings;Gauze (Comment);Compression wrap  Dressing Changed Changed  Dressing Status Clean;Dry;Intact  Site / Wound Assessment Red  % Wound base Red or Granulating 95%  % Wound base Yellow 5%  Wound Length (cm) 2 cm  Wound Width (cm) 2 cm  Wound Depth (cm) 0.1 cm  Margins Unattached edges (unapproximated)  Closure None  Drainage Amount Scant  Drainage Description Serosanguineous  Non-staged Wound Description Full thickness  Treatment Cleansed;Debridement (Selective)  Selective Debridement  Selective Debridement - Location entire  wound bed  Selective Debridement - Tools Used Forceps;Scalpel  Selective Debridement - Tissue Removed slough and dryskin perimeter of wound  Wound Therapy - Assess/Plan/Recommendations  Wound Therapy - Clinical Statement Wounds are progressing well towards healing, selevtive debridement for removal of slough with noted improved granulation tissues.  No reports of increased pain through session.  Continued with hydrogel both wounds and silver on LE with compression dressings  Wound Plan Continue with skilled intervention for Lt arm and LE wound care.      Selective Debridement Selective Debridement - Location: entire wound bed Selective Debridement - Tools Used: Forceps;Scalpel Selective Debridement - Tissue Removed: slough and dryskin perimeter of wound   Physical Therapy Assessment and Plan Wound Therapy - Assess/Plan/Recommendations Wound Therapy - Clinical Statement: Wounds are progressing well towards healing, selevtive debridement for removal of slough with noted improved granulation tissues.  No reports of increased pain through session.  Continued with hydrogel both wounds and silver on LE with compression dressings Wound Plan: Continue with skilled intervention for Lt arm and LE wound care.        Goals    Problem List Patient Active Problem List   Diagnosis Date Noted  . Proximal humerus fracture 05/30/2011  . CHEST PAIN, PRECORDIAL 02/26/2010  . HYPERLIPIDEMIA 02/23/2010  . DEPRESSION 02/23/2010  . GLAUCOMA 02/23/2010  . CATARACTS 02/23/2010  . RHEUMATIC FEVER 02/23/2010  . HYPERTENSION 02/23/2010  . MITRAL VALVE PROLAPSE 02/23/2010  . GERD 02/23/2010  . OSTEOARTHRITIS 02/23/2010  . FIBROMYALGIA 02/23/2010    GP    Aldona Lento 03/07/2014, 1:28 PM

## 2014-03-11 ENCOUNTER — Ambulatory Visit (HOSPITAL_COMMUNITY)
Admission: RE | Admit: 2014-03-11 | Discharge: 2014-03-11 | Disposition: A | Discharge status: Home / Self Care | Payer: Medicare Other | Source: Ambulatory Visit | Attending: Family Medicine | Admitting: Family Medicine

## 2014-03-11 ENCOUNTER — Other Ambulatory Visit: Payer: Self-pay | Admitting: Family Medicine

## 2014-03-11 DIAGNOSIS — IMO0001 Reserved for inherently not codable concepts without codable children: Secondary | ICD-10-CM | POA: Diagnosis not present

## 2014-03-11 NOTE — Progress Notes (Signed)
Physical Therapy - Wound Therapy  Treatment   Patient Details  Name: [name] MRN: [id number] Date of Birth: 05-26-1941  Today's Date: 03/11/2014 Time: 6948-5462 Time Calculation (min): 25 min Visit#: 6 of 8  Re-eval: 03/23/14 Charges:  Deb<20cm  Subjective Subjective Assessment Subjective: Pt reports her husband has been changing her Lt UE bandage due to sliding down  REports no pain but some itching. Prior Treatments: allergic reaction to xeroform  Wound Therapy Wound / Incision (Open or Dehisced) 02/21/14 Laceration Leg Left;Lateral (Active)  Dressing Type Hydrogel;Silver acticoat;Gauze,coban 03/11/2014 11:00 AM  Dressing Changed Changed 03/11/2014 11:00 AM  Dressing Status Clean;Dry;Intact 03/11/2014 11:00 AM  Dressing Change Frequency Every 3 days 02/21/2014  3:00 PM  Site / Wound Assessment Red 03/11/2014 11:00 AM  % Wound base Red or Granulating 95% 03/11/2014 11:00 AM  % Wound base Yellow 5% 03/11/2014 11:00 AM  Peri-wound Assessment Edema;Erythema (blanchable) 02/21/2014  3:00 PM  Wound Length (cm) 2 cm 03/07/2014  1:08 PM  Wound Width (cm) 2 cm 03/07/2014  1:08 PM  Wound Depth (cm) 0.1 cm 03/07/2014  1:08 PM  Margins Unattached edges (unapproximated) 03/11/2014 11:00 AM  Closure None 03/11/2014 11:00 AM  Drainage Amount Scant 03/11/2014 11:00 AM  Drainage Description Serosanguineous 03/11/2014 11:00 AM  Non-staged Wound Description Full thickness 03/11/2014 11:00 AM  Treatment Cleansed;Debridement (Selective) 03/11/2014 11:00 AM     Wound / Incision (Open or Dehisced) 02/24/14 Laceration Arm Left (Active)  Dressing Type Hydrogel;Gauze;Silver acticoat 03/11/2014 11:00 AM  Dressing Changed Changed 03/11/2014 11:00 AM  Dressing Status Clean;Dry;Intact 03/11/2014 11:00 AM  Site / Wound Assessment Clean;Dry 03/11/2014 11:00 AM  % Wound base Red or Granulating 100% 03/11/2014 11:00 AM  % Wound base Yellow 0% 03/11/2014 11:00 AM  % Wound base Black 0% 03/11/2014 11:00 AM  Wound Length  (cm) 2 cm 02/28/2014 11:39 AM  Wound Width (cm) 3.5 cm 02/28/2014 11:39 AM  Wound Depth (cm) 0 cm 02/24/2014 12:00 PM  Drainage Amount Minimal 03/11/2014 11:00 AM  Drainage Description Serous 03/11/2014 11:00 AM  Treatment Cleansed;Debridement (Selective) 03/11/2014 11:00 AM     Incision 05/30/11 Arm Left (Active)     Incision 05/30/11 Leg Left (Active)   Selective Debridement Selective Debridement - Location: Lt LE wound perimeter Selective Debridement - Tools Used: Forceps;Scalpel Selective Debridement - Tissue Removed: slough and dryskin perimeter of wound   Physical Therapy Assessment and Plan Wound Therapy - Assess/Plan/Recommendations Wound Therapy - Clinical Statement: three small abrasions left on Lt UE but without need of debridement, 100% granulated.  Cleansed area and redressed.  Perimeter of Lt LE wound debrided as with alot of dry tissue and slough.  All removed with 95% granulation beneath.  continued with silver acticoat and hydrogel to prevent adhesion to wound bed.  Coban applied following kerlix to Lt LE. Wound Plan: Continue with skilled intervention for Lt arm and LE wound care.  DO NOT USE XEROFORM as patient had allergic reaction on Lt UE.      Problem List Patient Active Problem List   Diagnosis Date Noted  . Proximal humerus fracture 05/30/2011  . CHEST PAIN, PRECORDIAL 02/26/2010  . HYPERLIPIDEMIA 02/23/2010  . DEPRESSION 02/23/2010  . GLAUCOMA 02/23/2010  . CATARACTS 02/23/2010  . RHEUMATIC FEVER 02/23/2010  . HYPERTENSION 02/23/2010  . MITRAL VALVE PROLAPSE 02/23/2010  . GERD 02/23/2010  . OSTEOARTHRITIS 02/23/2010  . FIBROMYALGIA 02/23/2010     Teena Irani, PTA/CLT 03/11/2014, 11:34 AM

## 2014-03-14 ENCOUNTER — Ambulatory Visit (HOSPITAL_COMMUNITY)
Admission: RE | Admit: 2014-03-14 | Discharge: 2014-03-14 | Disposition: A | Discharge status: Home / Self Care | Payer: Medicare Other | Source: Ambulatory Visit | Attending: Family Medicine | Admitting: Family Medicine

## 2014-03-14 ENCOUNTER — Other Ambulatory Visit (INDEPENDENT_AMBULATORY_CARE_PROVIDER_SITE_OTHER): Payer: Medicare Other

## 2014-03-14 DIAGNOSIS — I10 Essential (primary) hypertension: Secondary | ICD-10-CM

## 2014-03-14 DIAGNOSIS — IMO0001 Reserved for inherently not codable concepts without codable children: Secondary | ICD-10-CM | POA: Diagnosis not present

## 2014-03-14 DIAGNOSIS — E559 Vitamin D deficiency, unspecified: Secondary | ICD-10-CM

## 2014-03-14 DIAGNOSIS — E785 Hyperlipidemia, unspecified: Secondary | ICD-10-CM

## 2014-03-14 LAB — POCT CBC
Granulocyte percent: 60.5 %G (ref 37–80)
HEMATOCRIT: 34.4 % — AB (ref 37.7–47.9)
Hemoglobin: 11.2 g/dL — AB (ref 12.2–16.2)
LYMPH, POC: 1.7 (ref 0.6–3.4)
MCH: 27.1 pg (ref 27–31.2)
MCHC: 32.4 g/dL (ref 31.8–35.4)
MCV: 83.7 fL (ref 80–97)
MPV: 7.2 fL (ref 0–99.8)
PLATELET COUNT, POC: 271 10*3/uL (ref 142–424)
POC Granulocyte: 3.1 (ref 2–6.9)
POC LYMPH PERCENT: 33 %L (ref 10–50)
RBC: 4.1 M/uL (ref 4.04–5.48)
RDW, POC: 14 %
WBC: 5.2 10*3/uL (ref 4.6–10.2)

## 2014-03-14 NOTE — Progress Notes (Signed)
Physical Therapy - Wound Therapy  Treatment   Patient Details  Name: [name] MRN: [id number] Date of Birth: 10-12-1940  Today's Date: 03/14/2014 Time: 2423-5361 Time Calculation (min): 28 min Charge: selective debridement <20 cm  Visit#: 7 of 8  Re-eval: 03/23/14  Subjective Subjective Assessment Subjective: Wounds are feeling good, pt reports itching continues, no reports of pain today.    Pain Assessment Pain Assessment Pain Assessment: No/denies pain  Wound Therapy  03/14/14 1625  Subjective Assessment  Subjective Wounds are feeling good, pt reports itching continues, no reports of pain today.    Wound / Incision (Open or Dehisced)  Date First Assessed/Time First Assessed: 02/24/14 1150   Wound Type: Laceration  Location: Arm  Location Orientation: Left  Dressing Type Hydrogel;Gauze (Comment);Silver dressings  Dressing Changed Changed  Dressing Status Clean;Dry;Intact  Site / Wound Assessment Clean;Dry  % Wound base Red or Granulating 100%  % Wound base Yellow 0%  Drainage Amount None  Drainage Description Serous  Treatment Cleansed  Wound / Incision (Open or Dehisced)  Date First Assessed/Time First Assessed: 02/21/14 1150   Wound Type: Laceration  Location: Leg  Location Orientation: Left;Lateral  Dressing Type Hydrogel;Silver dressings;Gauze (Comment);Compression wrap  Dressing Changed Changed  Dressing Status Clean;Dry;Intact  Dressing Change Frequency Every 3 days  Site / Wound Assessment Red  % Wound base Red or Granulating 95%  % Wound base Yellow 5%  Margins Unattached edges (unapproximated)  Closure None  Drainage Amount Scant  Drainage Description Serosanguineous  Non-staged Wound Description Full thickness  Treatment Cleansed;Debridement (Selective)  Selective Debridement  Selective Debridement - Location Lt LE wound perimeter  Selective Debridement - Tools Used Forceps;Scalpel  Selective Debridement - Tissue Removed slough and dryskin  perimeter of wound  Wound Therapy - Assess/Plan/Recommendations  Wound Therapy - Clinical Statement Lt UE wound prrogressing well, just cleansed no debridement necessary.  Pt educated on proper techniques to continue caring for Lt arm at home, no need to continue wound care for UE.  LE wound progressing well with increased granulation tissue, continued debrridement to remove slough and dryskin perimeter of wound to promote healing.  Continued with hydrogel and silver and compressoin dressings for edema control.  Noted decreased in width and no depth with LE wound today.  No reports of pain through session.    Wound Plan Remeasure next session.  Continue with skilled intervention to Lt LE wound.  Check on UE to assure wound progressing well at home care.  DO NOT USE XEROFORM for dressings as to allergic reaction on arm.      Selective Debridement Selective Debridement - Location: Lt LE wound perimeter Selective Debridement - Tools Used: Forceps;Scalpel Selective Debridement - Tissue Removed: slough and dryskin perimeter of wound   Physical Therapy Assessment and Plan Wound Therapy - Assess/Plan/Recommendations Wound Therapy - Clinical Statement: Lt UE wound prrogressing well, just cleansed no debridement necessary.  Pt educated on proper techniques to continue caring for Lt arm at home, no need to continue wound care for UE.  LE wound progressing well with increased granulation tissue, continued debrridement to remove slough and dryskin perimeter of wound to promote healing.  Continued with hydrogel and silver and compressoin dressings for edema control.  Noted decreased in width and no depth with LE wound today.  No reports of pain through session.   Wound Plan: Remeasure next session.  Continue with skilled intervention to Lt LE wound.  Check on UE to assure wound progressing well at home care.  DO NOT USE XEROFORM for dressings as to allergic reaction on arm.        Goals    Problem  List Patient Active Problem List   Diagnosis Date Noted  . Proximal humerus fracture 05/30/2011  . CHEST PAIN, PRECORDIAL 02/26/2010  . HYPERLIPIDEMIA 02/23/2010  . DEPRESSION 02/23/2010  . GLAUCOMA 02/23/2010  . CATARACTS 02/23/2010  . RHEUMATIC FEVER 02/23/2010  . HYPERTENSION 02/23/2010  . MITRAL VALVE PROLAPSE 02/23/2010  . GERD 02/23/2010  . OSTEOARTHRITIS 02/23/2010  . FIBROMYALGIA 02/23/2010    GP    Aldona Lento 03/14/2014, 4:33 PM

## 2014-03-14 NOTE — Progress Notes (Signed)
Pt came for lab only

## 2014-03-15 LAB — NMR, LIPOPROFILE
Cholesterol: 180 mg/dL (ref 100–199)
HDL CHOLESTEROL BY NMR: 72 mg/dL (ref >39)
HDL Particle Number: 50.2 umol/L (ref >=30.5)
LDL Particle Number: 1257 nmol/L — ABNORMAL HIGH (ref <1000)
LDL Size: 21.3 nm (ref >20.5)
LDLC SERPL CALC-MCNC: 91 mg/dL (ref 0–99)
LP-IR SCORE: 25 (ref <=45)
SMALL LDL PARTICLE NUMBER: 585 nmol/L — AB (ref <=527)
Triglycerides by NMR: 87 mg/dL (ref 0–149)

## 2014-03-15 LAB — BMP8+EGFR
BUN / CREAT RATIO: 17 (ref 11–26)
BUN: 11 mg/dL (ref 8–27)
CHLORIDE: 100 mmol/L (ref 97–108)
CO2: 27 mmol/L (ref 18–29)
Calcium: 9.1 mg/dL (ref 8.7–10.3)
Creatinine, Ser: 0.63 mg/dL (ref 0.57–1.00)
GFR calc non Af Amer: 91 mL/min/{1.73_m2} (ref >59)
GFR, EST AFRICAN AMERICAN: 104 mL/min/{1.73_m2} (ref >59)
Glucose: 96 mg/dL (ref 65–99)
Potassium: 4.5 mmol/L (ref 3.5–5.2)
SODIUM: 142 mmol/L (ref 134–144)

## 2014-03-15 LAB — HEPATIC FUNCTION PANEL
ALK PHOS: 102 IU/L (ref 39–117)
ALT: 13 IU/L (ref 0–32)
AST: 25 IU/L (ref 0–40)
Albumin: 4.4 g/dL (ref 3.5–4.8)
Bilirubin, Direct: 0.13 mg/dL (ref 0.00–0.40)
Total Bilirubin: 0.4 mg/dL (ref 0.0–1.2)
Total Protein: 6.2 g/dL (ref 6.0–8.5)

## 2014-03-15 LAB — VITAMIN D 25 HYDROXY (VIT D DEFICIENCY, FRACTURES): Vit D, 25-Hydroxy: 58.2 ng/mL (ref 30.0–100.0)

## 2014-03-17 ENCOUNTER — Encounter: Payer: Self-pay | Admitting: *Deleted

## 2014-03-17 ENCOUNTER — Encounter (HOSPITAL_BASED_OUTPATIENT_CLINIC_OR_DEPARTMENT_OTHER): Payer: Medicare Other | Attending: Plastic Surgery

## 2014-03-18 ENCOUNTER — Ambulatory Visit (HOSPITAL_COMMUNITY)
Admission: RE | Admit: 2014-03-18 | Discharge: 2014-03-18 | Disposition: A | Discharge status: Home / Self Care | Payer: Medicare Other | Source: Ambulatory Visit | Attending: Family Medicine | Admitting: Family Medicine

## 2014-03-18 DIAGNOSIS — IMO0001 Reserved for inherently not codable concepts without codable children: Secondary | ICD-10-CM | POA: Diagnosis not present

## 2014-03-18 NOTE — Progress Notes (Signed)
Physical Therapy - Wound Therapy Re-evaluation  Re-evaluation   Patient Details  Name: [name] MRN: [id number] Date of Birth: [birth year]  Today's Date: 03/18/2014 Time: 1015-1040 Time Calculation (min): 25 min Visit#: 8 of 16  Re-eval: 03/23/14 Charges:  Deb<20cm  Subjective Subjective Assessment Subjective: Pt states her leg and arm are feeling much better.  Left arm is now healed and currently receiving woundcare for Lt LE only.  No pain reported.   Wound Therapy Wound / Incision (Open or Dehisced) 02/21/14 Laceration Leg Left;Lateral (Active)  Dressing Type Hydrogel;Silver dressings;Gauze (Comment);Compression wrap 03/18/2014 10:00 AM  Dressing Changed Changed 03/18/2014 10:00 AM  Dressing Status Clean;Dry;Intact 03/18/2014 10:00 AM  Dressing Change Frequency Every 3 days 03/18/2014 10:00 AM  Site / Wound Assessment Red;Yellow 03/18/2014 10:00 AM  % Wound base Red or Granulating 95% 03/18/2014 10:00 AM  % Wound base Yellow 5% 03/18/2014 10:00 AM  Peri-wound Assessment Edema;Erythema (blanchable) 02/21/2014  3:00 PM  Wound Length (cm) 1.4 cm (was 2cm) 03/18/2014 10:00 AM  Wound Width (cm) 1.4 cm (was 2cm) 03/18/2014 10:00 AM  Wound Depth (cm) 0 cm (was 0.1cm) 03/18/2014 10:00 AM  Margins Attached edges (approximated) 03/18/2014 10:00 AM  Closure None 03/18/2014 10:00 AM  Drainage Amount Scant 03/18/2014 10:00 AM  Drainage Description Serosanguineous 03/18/2014 10:00 AM  Non-staged Wound Description Not applicable 0/86/5784 69:62 AM  Treatment Cleansed;Debridement (Selective) 03/18/2014 10:00 AM     Incision 05/30/11 Arm Left (Active)     Incision 05/30/11 Leg Left (Active)   Selective Debridement Selective Debridement - Location: Lt LE wound perimeter Selective Debridement - Tools Used: Forceps;Scalpel Selective Debridement - Tissue Removed: slough and dryskin perimeter of wound   Physical Therapy Assessment and Plan Wound Therapy - Assess/Plan/Recommendations Wound Therapy -  Clinical Statement: Lt UE wound debrided perimeter dry skin and slough.  Lt UE wound is now healed and patient is caring for wound independently. Lt LE wound continues to progress with granulation tissue without pain or signs/symptoms of infection.  Continued with hydrogel and silver and compression dressings for edema control.  Noted decreased in length/width and no depth with LE wound today.  No reports of pain through session.   Wound Plan: recommend to continue wound care 2X week for 4 weeks or until wound on Lt LE is healed.      Goals Wound Therapy Goals - Improve the function of patient's integumentary system by progressing the wound(s) through the phases of wound healing by: Decrease Necrotic Tissue to: 0 Decrease Necrotic Tissue - Progress: Progressing toward goal Increase Granulation Tissue to: 100 Increase Granulation Tissue - Progress: Progressing toward goal Decrease Length/Width/Depth by (cm): 2.0x 1.5x.1 Decrease Length/Width/Depth - Progress: Progressing toward goal Patient/Family will be able to : walk without pain  Patient/Family Instruction Goal - Progress: Met  Problem List Patient Active Problem List   Diagnosis Date Noted  . Proximal humerus fracture 05/30/2011  . CHEST PAIN, PRECORDIAL 02/26/2010  . HYPERLIPIDEMIA 02/23/2010  . DEPRESSION 02/23/2010  . GLAUCOMA 02/23/2010  . CATARACTS 02/23/2010  . RHEUMATIC FEVER 02/23/2010  . HYPERTENSION 02/23/2010  . MITRAL VALVE PROLAPSE 02/23/2010  . GERD 02/23/2010  . OSTEOARTHRITIS 02/23/2010  . FIBROMYALGIA 02/23/2010    GP Functional Assessment Tool Used: clinical judgement Functional Limitation: Other PT primary Other PT Primary Current Status ([id number]): At least 20 percent but less than 40 percent impaired, limited or restricted Other PT Primary Goal Status ([id number]): At least 1 percent but less than 20 percent impaired, limited or restricted  Teena Irani, PTA/CLT 03/18/2014, 11:01 AM Your signature is required  to indicate approval of the treatment plan as stated above.  Please sign and return making a copy for your files.  You may hard copy or send electronically.  Please check one: ___1.  Approve of this plan  ___2.  Approve of this plan with the following changes.   ____________________________                             _____________ Physician                                                                      Date

## 2014-03-20 ENCOUNTER — Ambulatory Visit (HOSPITAL_COMMUNITY)
Admission: RE | Admit: 2014-03-20 | Discharge: 2014-03-20 | Disposition: A | Discharge status: Home / Self Care | Payer: Medicare Other | Source: Ambulatory Visit | Attending: Family Medicine | Admitting: Family Medicine

## 2014-03-20 ENCOUNTER — Encounter: Payer: Self-pay | Admitting: Family Medicine

## 2014-03-20 ENCOUNTER — Ambulatory Visit (INDEPENDENT_AMBULATORY_CARE_PROVIDER_SITE_OTHER): Payer: Medicare Other | Admitting: Family Medicine

## 2014-03-20 VITALS — BP 144/69 | HR 66 | Temp 97.1°F | Ht 65.0 in | Wt 190.0 lb

## 2014-03-20 DIAGNOSIS — IMO0001 Reserved for inherently not codable concepts without codable children: Secondary | ICD-10-CM

## 2014-03-20 DIAGNOSIS — R6884 Jaw pain: Secondary | ICD-10-CM

## 2014-03-20 DIAGNOSIS — I10 Essential (primary) hypertension: Secondary | ICD-10-CM

## 2014-03-20 DIAGNOSIS — E785 Hyperlipidemia, unspecified: Secondary | ICD-10-CM

## 2014-03-20 DIAGNOSIS — T148XXA Other injury of unspecified body region, initial encounter: Secondary | ICD-10-CM

## 2014-03-20 DIAGNOSIS — K219 Gastro-esophageal reflux disease without esophagitis: Secondary | ICD-10-CM

## 2014-03-20 NOTE — Patient Instructions (Addendum)
Medicare Annual Wellness Visit  [facility] and the medical providers at [facility] strive to bring you the best medical care.  In doing so we not only want to address your current medical conditions and concerns but also to detect new conditions early and prevent illness, disease and health-related problems.    Medicare offers a yearly Wellness Visit which allows our clinical staff to assess your need for preventative services including immunizations, lifestyle education, counseling to decrease risk of preventable diseases and screening for fall risk and other medical concerns.    This visit is provided free of charge (no copay) for all Medicare recipients. The clinical pharmacists at [facility] have begun to conduct these Wellness Visits which will also include a thorough review of all your medications.    As you primary medical provider recommend that you make an appointment for your Annual Wellness Visit if you have not done so already this year.  You may set up this appointment before you leave today or you may call back ([phone]) and schedule an appointment.  Please make sure when you call that you mention that you are scheduling your Annual Wellness Visit with the clinical pharmacist so that the appointment may be made for the proper length of time.     Continue current medications. Continue good therapeutic lifestyle changes which include good diet and exercise. Fall precautions discussed with patient. If an FOBT was given today- please return it to our front desk. If you are over 79 years old - you may need Prevnar 4 or the adult Pneumonia vaccine.  Flu Shots will be available at our office starting mid- September. Please call and schedule a FLU CLINIC APPOINTMENT.   Take ibuprofen over-the-counter twice daily for 10-14 days after eating If problems with your mouth and tooth continue, reschedule visit with  dentist Continue to be careful with arising and moving too quickly Work on physical therapy and strengthen your legs and body  Dr. Renford Dills with [facility] physicians in [place] on [address] # [phone]

## 2014-03-20 NOTE — Progress Notes (Signed)
Subjective:    Patient ID: [name], female    DOB: [birth year], 73 y.o.   MRN: [id number]  HPI Pt here for follow up and management of chronic medical problems. The   patient has 2 recent skin tears and these have been followed by the wound clinic and they are healing slowly. Recent lab work done on the patient will be reviewed with her today. She is up-to-date on all of her other health maintenance parameters. She does complain of some general arthralgias and some problems with her right ear.          Patient Active Problem List   Diagnosis Date Noted  . Proximal humerus fracture 05/30/2011  . CHEST PAIN, PRECORDIAL 02/26/2010  . HYPERLIPIDEMIA 02/23/2010  . DEPRESSION 02/23/2010  . GLAUCOMA 02/23/2010  . CATARACTS 02/23/2010  . RHEUMATIC FEVER 02/23/2010  . HYPERTENSION 02/23/2010  . MITRAL VALVE PROLAPSE 02/23/2010  . GERD 02/23/2010  . OSTEOARTHRITIS 02/23/2010  . FIBROMYALGIA 02/23/2010   Outpatient Encounter Prescriptions as of 03/20/2014  Medication Sig  . aspirin 81 MG tablet Take 1 tablet (81 mg total) by mouth every other day.  Marland Kitchen atenolol (TENORMIN) 50 MG tablet TAKE 1 TABLET DAILY  . Cholecalciferol (VITAMIN D3) 1000 UNITS CAPS Take 1 capsule by mouth daily.    . clorazepate (TRANXENE) 7.5 MG tablet TAKE 1 TABLET DAILY AS NEEDED  . DULoxetine (CYMBALTA) 60 MG capsule   . fluticasone (FLONASE) 50 MCG/ACT nasal spray Place 2 sprays into the nose daily.  . hydrocortisone (PROCTOZONE-HC) 2.5 % rectal cream Place 1 application rectally 2 (two) times daily as needed. For constipation  . ibuprofen (ADVIL,MOTRIN) 200 MG tablet Take 200 mg by mouth every 6 (six) hours as needed.  . Multiple Vitamins-Minerals (SENIOR MULTIVITAMIN PLUS PO) Take by mouth daily.    . Omeprazole Magnesium (PRILOSEC OTC PO) Take 1 capsule by mouth daily. For acid reflux  . simvastatin (ZOCOR) 80 MG tablet TAKE ONE TABLET AT BEDTIME  . TRAVATAN Z 0.004 % SOLN ophthalmic solution INSTILL 1  DROP INTO RIGHT EYE AT BEDTIME  . venlafaxine XR (EFFEXOR-XR) 75 MG 24 hr capsule Take 75 mg by mouth daily with breakfast.   . Vitamin B1-B12 (NEURO B-12 IJ) Inject 1 vial as directed every 30 (thirty) days.   Marland Kitchen zolpidem (AMBIEN) 10 MG tablet TAKE 1/2 TABLET AT BEDTIME AS NEEDED FOR SLEEP  . [DISCONTINUED] cephALEXin (KEFLEX) 500 MG capsule Take 1 capsule (500 mg total) by mouth 2 (two) times daily.  . [DISCONTINUED] DULoxetine (CYMBALTA) 60 MG capsule TAKE (1) CAPSULE DAILY    Review of Systems  Constitutional: Negative.   HENT: Negative.   Eyes: Negative.   Respiratory: Negative.   Cardiovascular: Negative.   Gastrointestinal: Negative.   Endocrine: Negative.   Genitourinary: Negative.   Musculoskeletal: Positive for arthralgias (general pains at times).  Skin: Negative.   Allergic/Immunologic: Negative.   Neurological: Negative.   Hematological: Negative.   Psychiatric/Behavioral: Negative.        Objective:   Physical Exam  Nursing note and vitals reviewed. Constitutional: She is oriented to person, place, and time. She appears well-developed and well-nourished. No distress.  HENT:  Head: Normocephalic and atraumatic.  Right Ear: External ear normal.  Left Ear: External ear normal.  Nose: Nose normal.  Mouth/Throat: Oropharynx is clear and moist. No oropharyngeal exudate.  Slight tenderness upon palpation of the right TM J. area  Eyes: Conjunctivae and EOM are normal. Pupils are equal, round, and reactive to  light. Right eye exhibits no discharge. Left eye exhibits no discharge. No scleral icterus.  Neck: Normal range of motion. Neck supple. No thyromegaly present.  Cardiovascular: Normal rate, regular rhythm and intact distal pulses.   No murmur heard. At 60 per minute  Pulmonary/Chest: Effort normal and breath sounds normal. No respiratory distress. She has no wheezes. She has no rales. She exhibits no tenderness.  Abdominal: Soft. Bowel sounds are normal. She  exhibits no mass. There is no tenderness. There is no rebound and no guarding.  Musculoskeletal: Normal range of motion. She exhibits no edema and no tenderness.  Lymphadenopathy:    She has no cervical adenopathy.  Neurological: She is alert and oriented to person, place, and time. She has normal reflexes.  Skin: Skin is warm and dry. No rash noted. No erythema. No pallor.  The skin wound of the left upper arm is completely healed, the skin wound of the lower leg almost healed  Psychiatric: She has a normal mood and affect. Her behavior is normal. Judgment and thought content normal.   BP 144/69  Pulse 66  Temp(Src) 97.1 F (36.2 C) (Oral)  Ht 5\' 5"  (1.651 m)  Wt 190 lb (86.183 kg)  BMI 31.62 kg/m2        Assessment & Plan:  1. Gastroesophageal reflux disease, esophagitis presence not specified  2. HYPERLIPIDEMIA  3. HYPERTENSION  4. FIBROMYALGIA  5. Multiple skin tears  6. Pain in upper jaw  Patient Instructions                       Medicare Annual Wellness Visit  [facility] and the medical providers at [facility] strive to bring you the best medical care.  In doing so we not only want to address your current medical conditions and concerns but also to detect new conditions early and prevent illness, disease and health-related problems.    Medicare offers a yearly Wellness Visit which allows our clinical staff to assess your need for preventative services including immunizations, lifestyle education, counseling to decrease risk of preventable diseases and screening for fall risk and other medical concerns.    This visit is provided free of charge (no copay) for all Medicare recipients. The clinical pharmacists at [facility] have begun to conduct these Wellness Visits which will also include a thorough review of all your medications.    As you primary medical provider recommend that you make an appointment for your Annual  Wellness Visit if you have not done so already this year.  You may set up this appointment before you leave today or you may call back ([phone]) and schedule an appointment.  Please make sure when you call that you mention that you are scheduling your Annual Wellness Visit with the clinical pharmacist so that the appointment may be made for the proper length of time.     Continue current medications. Continue good therapeutic lifestyle changes which include good diet and exercise. Fall precautions discussed with patient. If an FOBT was given today- please return it to our front desk. If you are over 41 years old - you may need Prevnar 67 or the adult Pneumonia vaccine.  Flu Shots will be available at our office starting mid- September. Please call and schedule a FLU CLINIC APPOINTMENT.   Take ibuprofen over-the-counter twice daily for 10-14 days after eating If problems with your mouth and tooth continue, reschedule visit with dentist Continue to be careful  with arising and moving too quickly Work on physical therapy and strengthen your legs and body  Dr. Renford Dills with [facility] physicians in [place] on [address]- ph # [phone]      Arrie Senate MD

## 2014-03-20 NOTE — Progress Notes (Signed)
Physical Therapy - Wound Therapy  Evaluation   Patient Details  Name: BRANDEE MARKIN MRN: [id number] Date of Birth: Feb 19, 1941  Today's Date: 03/20/2014 Time: 1308-6578 Time Calculation (min): 30 min Charge:  Self care [phone] Visit#: 9 of 9  Re-eval: 03/23/14  Subjective Subjective Assessment Subjective: Pt states she is doing well no pain and no issues. Patient and Family Stated Goals: wounds to heal  Date of Onset: 01/16/14 Prior Treatments: allergic reaction to xeroform  Pain Assessment Pain Assessment Pain Assessment: No/denies pain Pain Score: 0-No pain  Wound Therapy Wound / Incision (Open or Dehisced) 02/21/14 Laceration Leg Left;Lateral (Active)  Dressing Type Hydrogel;Silver dressings;Gauze (Comment);Compression wrap 03/20/2014  9:19 AM  Dressing Changed Changed 03/20/2014  9:19 AM  Dressing Status Clean;Dry 03/20/2014  9:19 AM  Dressing Change Frequency Every 3 days 03/18/2014 10:00 AM  Site / Wound Assessment Red 03/20/2014  9:19 AM  % Wound base Red or Granulating 100%   Was 5% 03/20/2014  9:19 AM  % Wound base Yellow 0%       Was 95% 03/20/2014  9:19 AM  Peri-wound Assessment Edema;Erythema (blanchable) 02/21/2014  3:00 PM  Wound Length (cm) 1.4 cm   Was 2.5 cm 03/18/2014 10:00 AM  Wound Width (cm) 1.4 cm   Was 2.0 cm  03/18/2014 10:00 AM  Wound Depth (cm) 0 cm      Was .2 cm 03/18/2014 10:00 AM  Margins Attached edges (approximated) 03/20/2014  9:19 AM  Closure None 03/20/2014  9:19 AM  Drainage Amount Scant 03/20/2014  9:19 AM  Drainage Description Serous 03/20/2014  9:19 AM  Non-staged Wound Description Not applicable 11/22/4130 44:01 AM  Treatment Cleansed 03/20/2014  9:19 AM     Incision 05/30/11 Arm Left (Active)     Incision 05/30/11 Leg Left (Active)       Physical Therapy Assessment and Plan Wound Therapy - Assess/Plan/Recommendations Wound Therapy - Clinical Statement: Wound has prgressed well and is now healing.  Therapist and patient agreed that pt no  longer needed skilled care to assist in the healing of her leg wound.  Therapist explained how to care for wound at home pt verbalized understanding  Hydrotherapy Plan: Patient/family education Wound Plan: Discharge to home care for wound.   Pt understands that if wound does not continue to heal she should call the clinic .       Goals Wound Therapy Goals - Improve the function of patient's integumentary system by progressing the wound(s) through the phases of wound healing by: Decrease Necrotic Tissue to: 0 Decrease Necrotic Tissue - Progress: Met Increase Granulation Tissue to: 100 Increase Granulation Tissue - Progress: Met Decrease Length/Width/Depth by (cm): 2.0x 1.5x.1 Decrease Length/Width/Depth - Progress: Met Patient/Family will be able to : walk without pain  Patient/Family Instruction Goal - Progress: Met  Problem List Patient Active Problem List   Diagnosis Date Noted  . Proximal humerus fracture 05/30/2011  . CHEST PAIN, PRECORDIAL 02/26/2010  . HYPERLIPIDEMIA 02/23/2010  . DEPRESSION 02/23/2010  . GLAUCOMA 02/23/2010  . CATARACTS 02/23/2010  . RHEUMATIC FEVER 02/23/2010  . HYPERTENSION 02/23/2010  . MITRAL VALVE PROLAPSE 02/23/2010  . GERD 02/23/2010  . OSTEOARTHRITIS 02/23/2010  . FIBROMYALGIA 02/23/2010    GP Functional Assessment Tool Used: clinical judgement Functional Limitation: Other PT primary Other PT Primary Goal Status (U2725): At least 1 percent but less than 20 percent impaired, limited or restricted Other PT Primary Discharge Status [phone]): At least 1 percent but less than 20 percent impaired, limited or  restricted  Melissia Lahman,CINDY 03/20/2014, 9:28 AM Your signature is required to indicate approval of the treatment plan as stated above.  Please sign and return making a copy for your files.  You may hard copy or send electronically.  Please check one: ___1.  Approve of this plan  ___2.  Approve of this plan with the following  changes.   ____________________________                             _____________ Physician                                                                      Date

## 2014-03-24 ENCOUNTER — Telehealth (HOSPITAL_COMMUNITY): Payer: Self-pay | Admitting: Physical Therapy

## 2014-03-24 NOTE — Telephone Encounter (Signed)
Patient called in today and said that a lady here had told her that we would handle the paperwork for her lawsuite. I have checked this patient in several times and talked with her and her husband asking for the lawyer information. I was always told that their lawyer would contact me. After 10 visit this had not happen. I called Ms. Romanello today and ask her to give me the lawyer's name and phone number, plus the file claim number. I called the lawyer's office and they should fax the information to me today. Lawyer Laurel Dimmer  [phone]  [id number]. [place]

## 2014-03-25 ENCOUNTER — Ambulatory Visit (HOSPITAL_COMMUNITY): Payer: Medicare Other | Admitting: Physical Therapy

## 2014-03-28 ENCOUNTER — Ambulatory Visit (HOSPITAL_COMMUNITY): Payer: Medicare Other | Admitting: Physical Therapy

## 2014-04-01 ENCOUNTER — Ambulatory Visit (INDEPENDENT_AMBULATORY_CARE_PROVIDER_SITE_OTHER): Payer: Medicare Other | Admitting: *Deleted

## 2014-04-01 ENCOUNTER — Ambulatory Visit (INDEPENDENT_AMBULATORY_CARE_PROVIDER_SITE_OTHER): Payer: Medicare Other

## 2014-04-01 ENCOUNTER — Other Ambulatory Visit: Payer: Self-pay | Admitting: Family Medicine

## 2014-04-01 DIAGNOSIS — Z23 Encounter for immunization: Secondary | ICD-10-CM

## 2014-04-01 DIAGNOSIS — E538 Deficiency of other specified B group vitamins: Secondary | ICD-10-CM

## 2014-04-01 NOTE — Progress Notes (Signed)
Patient ID: [name], female   DOB: 06/29/1940, 73 y.o.   MRN: [id number] Pt tolerated inj well

## 2014-04-03 NOTE — Telephone Encounter (Signed)
Patient last seen in office on 03-20-14. Rx last filled on 01-31-14 for #30. Please advise. If approved please route to pool A so nurse can phone in to pharmacy

## 2014-04-03 NOTE — Telephone Encounter (Signed)
This is okay to refill 

## 2014-04-04 NOTE — Telephone Encounter (Signed)
Called into pharmacy

## 2014-04-16 ENCOUNTER — Ambulatory Visit (INDEPENDENT_AMBULATORY_CARE_PROVIDER_SITE_OTHER): Payer: Medicare Other

## 2014-04-16 ENCOUNTER — Encounter: Payer: Self-pay | Admitting: Pharmacist

## 2014-04-16 ENCOUNTER — Ambulatory Visit (INDEPENDENT_AMBULATORY_CARE_PROVIDER_SITE_OTHER): Payer: Medicare Other | Admitting: Pharmacist

## 2014-04-16 ENCOUNTER — Other Ambulatory Visit: Payer: Self-pay | Admitting: *Deleted

## 2014-04-16 VITALS — Ht 65.0 in | Wt 187.5 lb

## 2014-04-16 DIAGNOSIS — M8580 Other specified disorders of bone density and structure, unspecified site: Secondary | ICD-10-CM

## 2014-04-16 DIAGNOSIS — R296 Repeated falls: Secondary | ICD-10-CM

## 2014-04-16 DIAGNOSIS — M25512 Pain in left shoulder: Secondary | ICD-10-CM

## 2014-04-16 DIAGNOSIS — M8080XA Other osteoporosis with current pathological fracture, unspecified site, initial encounter for fracture: Secondary | ICD-10-CM | POA: Insufficient documentation

## 2014-04-16 DIAGNOSIS — Z9181 History of falling: Secondary | ICD-10-CM | POA: Insufficient documentation

## 2014-04-16 DIAGNOSIS — M8080XD Other osteoporosis with current pathological fracture, unspecified site, subsequent encounter for fracture with routine healing: Secondary | ICD-10-CM

## 2014-04-16 MED ORDER — RISEDRONATE SODIUM 150 MG PO TABS: 150.0000 mg | ORAL_TABLET | ORAL | Status: DC

## 2014-04-16 NOTE — Patient Instructions (Signed)

## 2014-04-16 NOTE — Progress Notes (Signed)
Patient ID: [name], female   DOB: 1941/03/04, 73 y.o.   MRN: [id number]  Osteoporosis Clinic Current Height: Height: 5\' 5"  (165.1 cm)      Max Lifetime Height:  5\' 5"  Current Weight: Weight: 187 lb 8 oz (85.049 kg)       Ethnicity:Caucasian    HPI: Does pt already have a diagnosis of:  Osteoporosis?  Yes  Back Pain?  Yes       Kyphosis?  No Prior fracture?  Yes - left arm Med(s) for Osteoporosis/Osteopenia:  none Med(s) previously tried for Osteoporosis/Osteopenia:  boniva - not sure why stopped length of therpay?);  Fosamax or alendronate - caused nausea / espohagitis                                                             PMH: Age at menopause:  16's Hysterectomy?  No Oophorectomy?  No HRT? Yes - Former.  Type/duration: estrogen - unsure of length of therapy Steroid Use?  No Thyroid med?  No History of cancer?  No History of digestive disorders (ie Crohn's)?  Yes - GERD on chronic PPI Current or previous eating disorders?  No Last Vitamin D Result:  58.2 (03/14/2014) Last GFR Result:  91 (03/14/2014)   FH/SH: Family history of osteoporosis?  Yes - mother Parent with history of hip fracture?  Yes - mother Family history of breast cancer?  No Exercise?  No Smoking?  No Alcohol?  No    Calcium Assessment Calcium Intake  # of servings/day  Calcium mg  Milk (8 oz) 0  x  300  = 0  Yogurt (4 oz) 0.5 x  200 = 100mg   Cheese (1 oz) 1 x  200 = 200mg   Other Calcium sources   250mg   Ca supplement MVI = 400mg    Estimated calcium intake per day 1050mg     DEXA Results Date of Test T-Score for AP Spine L1-L4 T-Score for Total Left Hip T-Score for Total Right Hip T-Score for Neck of Left Hip T-Score for Neck of Right Hip  04/16/2014 -1.9 -0.7 -0.6 -1.3 -1.6  03/14/2012 -2.2 -0.4 -0.5 -1.0 -1.4  02/10/2010 -2.4 -0.2 -0.5 -1.1 -1.4  10/30/2007 -2.4 -0.8 -0.3 -1.6 -1.3    Assessment: Osteoporosis  High Fall Risk   Recommendations: 1.  Start  risedronate (ACTONEL)  150mg  1 tablet every 30 days 2.  recommend calcium 1200mg  daily through supplementation or diet.  3.  recommend weight bearing exercise - 30 minutes at least 4 days per week.   4.  Counseled and educated about fall risk and prevention. 5.  Decreased ambien to 10mg  1/2 tablet qhs as needed for next 7 days, then try to discontinue.  Patient advised to call office for alternative to [facility] if has difficulty sleeping. Called pharmacy and cancelled refills  Recheck DEXA:  2 years  Time spent counseling patient:  30 minutes  Cherre Robins, PharmD, CPP

## 2014-04-17 ENCOUNTER — Telehealth: Payer: Self-pay

## 2014-04-17 NOTE — Telephone Encounter (Signed)
Message copied by Koren Bound on Thu Apr 17, 2014  8:39 AM ------      Message from: Chipper Herb      Created: Wed Apr 16, 2014 12:55 PM       As per radiology report ------

## 2014-04-17 NOTE — Telephone Encounter (Signed)
Pt aware of shoulder x-ray results She will contact Dr. Veverly Fells to set up an appointment for an injection

## 2014-04-21 ENCOUNTER — Ambulatory Visit: Payer: Medicare Other | Admitting: Family Medicine

## 2014-04-23 ENCOUNTER — Ambulatory Visit: Payer: Medicare Other | Admitting: Family Medicine

## 2014-05-02 ENCOUNTER — Ambulatory Visit (INDEPENDENT_AMBULATORY_CARE_PROVIDER_SITE_OTHER): Payer: Medicare Other

## 2014-05-02 ENCOUNTER — Encounter: Payer: Self-pay | Admitting: Family Medicine

## 2014-05-02 ENCOUNTER — Ambulatory Visit (INDEPENDENT_AMBULATORY_CARE_PROVIDER_SITE_OTHER): Payer: Medicare Other | Admitting: Family Medicine

## 2014-05-02 VITALS — BP 146/77 | HR 70 | Temp 98.4°F | Ht 65.0 in | Wt 187.0 lb

## 2014-05-02 DIAGNOSIS — R296 Repeated falls: Secondary | ICD-10-CM

## 2014-05-02 DIAGNOSIS — M545 Low back pain: Secondary | ICD-10-CM

## 2014-05-02 DIAGNOSIS — R2681 Unsteadiness on feet: Secondary | ICD-10-CM

## 2014-05-02 DIAGNOSIS — M47816 Spondylosis without myelopathy or radiculopathy, lumbar region: Secondary | ICD-10-CM

## 2014-05-02 DIAGNOSIS — M25551 Pain in right hip: Secondary | ICD-10-CM

## 2014-05-02 DIAGNOSIS — S7001XA Contusion of right hip, initial encounter: Secondary | ICD-10-CM

## 2014-05-02 NOTE — Patient Instructions (Addendum)
Be careful and don't put yourself at risk for falls SLOW DOWN and take your time We will schedule physical therapy next door with [place] Cut down on the Ambien from 5 mg nightly to 2.5 nightly  Use ibuprofen after meals - twice a day as needed for 2 weeks Use warm wet compresses to painful areas.

## 2014-05-02 NOTE — Progress Notes (Signed)
Subjective:    Patient ID: [name], female    DOB: Jul 22, 1940, 73 y.o.   MRN: [id number]  HPI Patient here today for right hip and back pain from a fall that occurred last Saturday. The patient was working in the yard when she turned and fell on her right hip. This was witnessed by her husband and this happened 6 days ago. She has a contusion of her right lateral hip and her back.        Patient Active Problem List   Diagnosis Date Noted  . Osteoporosis with fracture 04/16/2014  . At high risk for falls 04/16/2014  . Proximal humerus fracture 05/30/2011  . CHEST PAIN, PRECORDIAL 02/26/2010  . HYPERLIPIDEMIA 02/23/2010  . DEPRESSION 02/23/2010  . GLAUCOMA 02/23/2010  . CATARACTS 02/23/2010  . RHEUMATIC FEVER 02/23/2010  . HYPERTENSION 02/23/2010  . MITRAL VALVE PROLAPSE 02/23/2010  . GERD 02/23/2010  . OSTEOARTHRITIS 02/23/2010  . FIBROMYALGIA 02/23/2010   Outpatient Encounter Prescriptions as of 05/02/2014  Medication Sig  . aspirin 81 MG tablet Take 1 tablet (81 mg total) by mouth every other day.  Marland Kitchen atenolol (TENORMIN) 50 MG tablet TAKE 1 TABLET DAILY  . Cholecalciferol (VITAMIN D3) 1000 UNITS CAPS Take 1 capsule by mouth daily.    . clorazepate (TRANXENE) 7.5 MG tablet TAKE 1 TABLET DAILY AS NEEDED  . fluticasone (FLONASE) 50 MCG/ACT nasal spray Place 2 sprays into the nose daily.  . hydrocortisone (PROCTOZONE-HC) 2.5 % rectal cream Place 1 application rectally 2 (two) times daily as needed. For constipation  . ibuprofen (ADVIL,MOTRIN) 200 MG tablet Take 200 mg by mouth every 6 (six) hours as needed.  . Multiple Vitamins-Minerals (SENIOR MULTIVITAMIN PLUS PO) Take by mouth daily.    . Omeprazole Magnesium (PRILOSEC OTC PO) Take 1 capsule by mouth daily. For acid reflux  . risedronate (ACTONEL) 150 MG tablet Take 1 tablet (150 mg total) by mouth every 30 (thirty) days. with water on empty stomach, nothing by mouth or lie down for next 60 minutes.  . simvastatin  (ZOCOR) 80 MG tablet TAKE ONE TABLET AT BEDTIME  . TRAVATAN Z 0.004 % SOLN ophthalmic solution INSTILL 1 DROP INTO RIGHT EYE AT BEDTIME  . venlafaxine XR (EFFEXOR-XR) 75 MG 24 hr capsule Take 75 mg by mouth daily with breakfast.   . Vitamin B1-B12 (NEURO B-12 IJ) Inject 1 vial as directed every 30 (thirty) days.   Marland Kitchen zolpidem (AMBIEN) 10 MG tablet Take 0.5 tablets (5 mg total) by mouth at bedtime as needed for sleep.    Review of Systems  Constitutional: Negative.   HENT: Negative.   Eyes: Negative.   Respiratory: Negative.   Cardiovascular: Negative.   Gastrointestinal: Negative.   Endocrine: Negative.   Genitourinary: Negative.   Musculoskeletal: Positive for back pain and arthralgias (right hip ).  Skin: Negative.   Allergic/Immunologic: Negative.   Neurological: Negative.   Hematological: Negative.   Psychiatric/Behavioral: Negative.        Objective:   Physical Exam  Constitutional: She is oriented to person, place, and time. She appears well-developed and well-nourished. No distress.  HENT:  Head: Normocephalic and atraumatic.  Eyes: Conjunctivae and EOM are normal. Pupils are equal, round, and reactive to light. Right eye exhibits no discharge. Left eye exhibits no discharge. No scleral icterus.  Neck: Normal range of motion.  Cardiovascular: Regular rhythm and intact distal pulses.   Pulmonary/Chest: Effort normal. No respiratory distress.  Abdominal: Soft. Bowel sounds are normal. She exhibits  no mass. There is no tenderness. There is no rebound and no guarding.  Musculoskeletal: Normal range of motion. She exhibits no edema or tenderness.  The patient has tenderness in her) lumbar spine area. She also has a large contusion of her right lateral thigh and a small contusion on her back. Leg raising was good bilaterally without pain hip abduction was good bilaterally without pain. She did have trouble arising from a supine position because of pain in her back.  Neurological:  She is alert and oriented to person, place, and time. She has normal reflexes.  Skin: Skin is warm and dry. No rash noted.  Psychiatric: She has a normal mood and affect. Her behavior is normal. Judgment and thought content normal.  Nursing note and vitals reviewed.  BP 146/77 mmHg  Pulse 70  Temp(Src) 98.4 F (36.9 C) (Oral)  Ht 5\' 5"  (1.651 m)  Wt 187 lb (84.823 kg)  BMI 31.12 kg/m2  [facility] reading (PRIMARY) by  Dr.Mylea Roarty-LS spine and right hip--degenerative changes                                        Assessment & Plan:  1. Hip pain, acute, right - DG Hip Complete Right; Future - Ambulatory referral to Physical Therapy  2. Bilateral low back pain, with sciatica presence unspecified - DG Lumbar Spine 2-3 Views; Future - Ambulatory referral to Physical Therapy  3. Multiple falls - Ambulatory referral to Physical Therapy  4. Gait instability - Ambulatory referral to Physical Therapy  5. Contusion of right hip, initial encounter  6. Spondylosis of lumbar region without myelopathy or radiculopathy   Patient Instructions  Be careful and don't put yourself at risk for falls SLOW DOWN and take your time We will schedule physical therapy next door with [place] Cut down on the Ambien from 5 mg nightly to 2.5 nightly  Use ibuprofen after meals - twice a day as needed for 2 weeks Use warm wet compresses to painful areas.     Arrie Senate MD

## 2014-05-13 ENCOUNTER — Ambulatory Visit: Payer: Medicare Other | Attending: Family Medicine | Admitting: Physical Therapy

## 2014-05-13 DIAGNOSIS — R42 Dizziness and giddiness: Secondary | ICD-10-CM | POA: Insufficient documentation

## 2014-05-13 DIAGNOSIS — M25551 Pain in right hip: Secondary | ICD-10-CM | POA: Diagnosis not present

## 2014-05-13 DIAGNOSIS — M5440 Lumbago with sciatica, unspecified side: Secondary | ICD-10-CM | POA: Diagnosis not present

## 2014-05-16 ENCOUNTER — Ambulatory Visit: Payer: Medicare Other | Admitting: Physical Therapy

## 2014-05-16 DIAGNOSIS — M25551 Pain in right hip: Secondary | ICD-10-CM | POA: Diagnosis not present

## 2014-05-16 DIAGNOSIS — Z0289 Encounter for other administrative examinations: Secondary | ICD-10-CM

## 2014-05-19 ENCOUNTER — Telehealth: Payer: Self-pay | Admitting: Pharmacist

## 2014-05-19 MED ORDER — RALOXIFENE HCL 60 MG PO TABS: 60.0000 mg | ORAL_TABLET | Freq: Every day | ORAL | Status: DC

## 2014-05-19 NOTE — Telephone Encounter (Signed)
Patient tried Actonel about 1 week ago and has developed bone pain and nausea.  She would like to try something else.  Discontnue Actonel and start evista 60mg  1 tablet daily.

## 2014-05-20 ENCOUNTER — Ambulatory Visit: Payer: Medicare Other | Admitting: Physical Therapy

## 2014-05-20 ENCOUNTER — Other Ambulatory Visit: Payer: Self-pay | Admitting: Family Medicine

## 2014-05-20 DIAGNOSIS — M25551 Pain in right hip: Secondary | ICD-10-CM | POA: Diagnosis not present

## 2014-05-27 ENCOUNTER — Other Ambulatory Visit: Payer: Self-pay | Admitting: Family Medicine

## 2014-05-28 ENCOUNTER — Ambulatory Visit: Payer: Medicare Other | Attending: Family Medicine | Admitting: Physical Therapy

## 2014-05-28 DIAGNOSIS — M5440 Lumbago with sciatica, unspecified side: Secondary | ICD-10-CM | POA: Insufficient documentation

## 2014-05-28 DIAGNOSIS — M25551 Pain in right hip: Secondary | ICD-10-CM | POA: Insufficient documentation

## 2014-05-28 DIAGNOSIS — R42 Dizziness and giddiness: Secondary | ICD-10-CM | POA: Insufficient documentation

## 2014-05-28 NOTE — Telephone Encounter (Signed)
Last seen 05/12/14  DWM  If approved route to nurse to call into [facility]

## 2014-05-30 ENCOUNTER — Ambulatory Visit: Payer: Medicare Other | Admitting: *Deleted

## 2014-05-30 ENCOUNTER — Other Ambulatory Visit: Payer: Self-pay | Admitting: Family Medicine

## 2014-05-30 DIAGNOSIS — M25551 Pain in right hip: Secondary | ICD-10-CM | POA: Diagnosis not present

## 2014-06-04 ENCOUNTER — Ambulatory Visit (INDEPENDENT_AMBULATORY_CARE_PROVIDER_SITE_OTHER): Payer: Medicare Other | Admitting: *Deleted

## 2014-06-04 DIAGNOSIS — E538 Deficiency of other specified B group vitamins: Secondary | ICD-10-CM

## 2014-06-04 NOTE — Patient Instructions (Signed)

## 2014-06-04 NOTE — Progress Notes (Signed)
Vitamin b12 injection given and tolerated well.  

## 2014-06-06 ENCOUNTER — Ambulatory Visit: Payer: Medicare Other | Admitting: *Deleted

## 2014-06-06 DIAGNOSIS — M25551 Pain in right hip: Secondary | ICD-10-CM | POA: Diagnosis not present

## 2014-06-10 ENCOUNTER — Ambulatory Visit: Payer: Medicare Other | Admitting: Physical Therapy

## 2014-06-10 DIAGNOSIS — M25551 Pain in right hip: Secondary | ICD-10-CM | POA: Diagnosis not present

## 2014-06-13 ENCOUNTER — Ambulatory Visit: Payer: Medicare Other | Admitting: *Deleted

## 2014-06-13 DIAGNOSIS — M25551 Pain in right hip: Secondary | ICD-10-CM | POA: Diagnosis not present

## 2014-06-17 ENCOUNTER — Encounter: Payer: Medicare Other | Admitting: Physical Therapy

## 2014-06-23 ENCOUNTER — Ambulatory Visit: Payer: Medicare Other | Admitting: Physical Therapy

## 2014-06-23 DIAGNOSIS — M25551 Pain in right hip: Secondary | ICD-10-CM | POA: Diagnosis not present

## 2014-06-25 ENCOUNTER — Encounter: Payer: Medicare Other | Admitting: Physical Therapy

## 2014-07-01 ENCOUNTER — Telehealth: Payer: Self-pay | Admitting: Family Medicine

## 2014-07-01 DIAGNOSIS — K219 Gastro-esophageal reflux disease without esophagitis: Secondary | ICD-10-CM

## 2014-07-01 DIAGNOSIS — E559 Vitamin D deficiency, unspecified: Secondary | ICD-10-CM

## 2014-07-01 DIAGNOSIS — I10 Essential (primary) hypertension: Secondary | ICD-10-CM

## 2014-07-01 DIAGNOSIS — E785 Hyperlipidemia, unspecified: Secondary | ICD-10-CM

## 2014-07-03 ENCOUNTER — Other Ambulatory Visit: Payer: Self-pay | Admitting: Family Medicine

## 2014-07-03 ENCOUNTER — Encounter: Payer: Self-pay | Admitting: Nurse Practitioner

## 2014-07-03 ENCOUNTER — Ambulatory Visit (INDEPENDENT_AMBULATORY_CARE_PROVIDER_SITE_OTHER): Payer: Medicare Other | Admitting: Nurse Practitioner

## 2014-07-03 VITALS — BP 167/76 | HR 64 | Temp 97.1°F | Ht 65.0 in | Wt 184.0 lb

## 2014-07-03 DIAGNOSIS — K640 First degree hemorrhoids: Secondary | ICD-10-CM

## 2014-07-03 DIAGNOSIS — K5900 Constipation, unspecified: Secondary | ICD-10-CM

## 2014-07-03 MED ORDER — HYDROCORTISONE ACETATE 25 MG RE SUPP: 25.0000 mg | Freq: Two times a day (BID) | RECTAL | Status: DC

## 2014-07-03 MED ORDER — HYDROCORTISONE 2.5 % RE CREA: 1.0000 "application " | TOPICAL_CREAM | Freq: Two times a day (BID) | RECTAL | Status: DC | PRN

## 2014-07-03 NOTE — Telephone Encounter (Signed)
Orders placed.

## 2014-07-03 NOTE — Progress Notes (Signed)
   Subjective:    Patient ID: [name], female    DOB: 01/11/1941, 74 y.o.   MRN: [id number]  HPI Patient in c/o hemorrhoids that are inflamed today- She says that over christmas that she started having worsening constipation. Preparation H has helped some but she feels like there is something up inside of rectum. No blood in stool just some on toilet paper when she wipes.    Review of Systems  Constitutional: Negative.   HENT: Negative.   Respiratory: Negative.   Cardiovascular: Negative.   Gastrointestinal: Positive for constipation.  Genitourinary: Negative.   Neurological: Negative.   Psychiatric/Behavioral: Negative.   All other systems reviewed and are negative.      Objective:   Physical Exam  Constitutional: She is oriented to person, place, and time. She appears well-developed and well-nourished.  Cardiovascular: Normal rate, regular rhythm and normal heart sounds.   Pulmonary/Chest: Effort normal and breath sounds normal.  Abdominal: Soft. Bowel sounds are normal. She exhibits no distension. There is no tenderness.  Genitourinary:  Mildly thrombosed external and internal hemorrhoids.  Neurological: She is alert and oriented to person, place, and time.  Skin: Skin is warm and dry.  Psychiatric: She has a normal mood and affect. Her behavior is normal. Judgment and thought content normal.   BP 167/76 mmHg  Pulse 64  Temp(Src) 97.1 F (36.2 C) (Oral)  Ht 5\' 5"  (1.651 m)  Wt 184 lb (83.462 kg)  BMI 30.62 kg/m2        Assessment & Plan:   1. First degree hemorrhoids   2. Constipation, unspecified constipation type    Meds ordered this encounter  Medications  . hydrocortisone (ANUSOL-HC) 25 MG suppository    Sig: Place 1 suppository (25 mg total) rectally 2 (two) times daily.    Dispense:  12 suppository    Refill:  0    Order Specific Question:  Supervising Provider    Answer:  Joycelyn Man   miralax daily Increase fiber in diet Force  fluids RTO if not improving  Mary-Margaret Hassell Done, FNP

## 2014-07-03 NOTE — Patient Instructions (Signed)

## 2014-07-03 NOTE — Addendum Note (Signed)
Addended by: Rolena Infante on: 07/03/2014 04:09 PM   Modules accepted: Orders

## 2014-07-08 ENCOUNTER — Ambulatory Visit: Payer: Medicare Other

## 2014-07-08 ENCOUNTER — Other Ambulatory Visit (INDEPENDENT_AMBULATORY_CARE_PROVIDER_SITE_OTHER): Payer: Medicare Other

## 2014-07-08 ENCOUNTER — Ambulatory Visit (INDEPENDENT_AMBULATORY_CARE_PROVIDER_SITE_OTHER): Payer: Medicare Other | Admitting: *Deleted

## 2014-07-08 DIAGNOSIS — E559 Vitamin D deficiency, unspecified: Secondary | ICD-10-CM

## 2014-07-08 DIAGNOSIS — I10 Essential (primary) hypertension: Secondary | ICD-10-CM

## 2014-07-08 DIAGNOSIS — K219 Gastro-esophageal reflux disease without esophagitis: Secondary | ICD-10-CM

## 2014-07-08 DIAGNOSIS — E538 Deficiency of other specified B group vitamins: Secondary | ICD-10-CM

## 2014-07-08 DIAGNOSIS — E785 Hyperlipidemia, unspecified: Secondary | ICD-10-CM

## 2014-07-08 LAB — POCT CBC
GRANULOCYTE PERCENT: 58.1 % (ref 37–80)
HEMATOCRIT: 38.3 % (ref 37.7–47.9)
Hemoglobin: 12.1 g/dL — AB (ref 12.2–16.2)
Lymph, poc: 1.8 (ref 0.6–3.4)
MCH, POC: 26.3 pg — AB (ref 27–31.2)
MCHC: 31.5 g/dL — AB (ref 31.8–35.4)
MCV: 83.4 fL (ref 80–97)
MPV: 7.6 fL (ref 0–99.8)
POC Granulocyte: 2.8 (ref 2–6.9)
POC LYMPH PERCENT: 36.1 %L (ref 10–50)
Platelet Count, POC: 264 10*3/uL (ref 142–424)
RBC: 4.6 M/uL (ref 4.04–5.48)
RDW, POC: 14.7 %
WBC: 4.9 10*3/uL (ref 4.6–10.2)

## 2014-07-08 NOTE — Progress Notes (Signed)
Lab only 

## 2014-07-08 NOTE — Patient Instructions (Signed)

## 2014-07-08 NOTE — Progress Notes (Signed)
Vitamin b12 injection given and tolerated well.  

## 2014-07-09 ENCOUNTER — Telehealth: Payer: Self-pay | Admitting: Family Medicine

## 2014-07-09 LAB — BMP8+EGFR
BUN/Creatinine Ratio: 14 (ref 11–26)
BUN: 9 mg/dL (ref 8–27)
CALCIUM: 9.2 mg/dL (ref 8.7–10.3)
CO2: 27 mmol/L (ref 18–29)
Chloride: 103 mmol/L (ref 97–108)
Creatinine, Ser: 0.65 mg/dL (ref 0.57–1.00)
GFR calc Af Amer: 103 mL/min/{1.73_m2} (ref >59)
GFR, EST NON AFRICAN AMERICAN: 89 mL/min/{1.73_m2} (ref >59)
Glucose: 102 mg/dL — ABNORMAL HIGH (ref 65–99)
Potassium: 4.3 mmol/L (ref 3.5–5.2)
Sodium: 144 mmol/L (ref 134–144)

## 2014-07-09 LAB — HEPATIC FUNCTION PANEL
ALBUMIN: 4.4 g/dL (ref 3.5–4.8)
ALK PHOS: 95 IU/L (ref 39–117)
ALT: 10 IU/L (ref 0–32)
AST: 21 IU/L (ref 0–40)
BILIRUBIN TOTAL: 0.4 mg/dL (ref 0.0–1.2)
Bilirubin, Direct: 0.12 mg/dL (ref 0.00–0.40)
TOTAL PROTEIN: 6.4 g/dL (ref 6.0–8.5)

## 2014-07-09 LAB — NMR, LIPOPROFILE
Cholesterol: 183 mg/dL (ref 100–199)
HDL Cholesterol by NMR: 68 mg/dL (ref >39)
HDL PARTICLE NUMBER: 45.3 umol/L (ref >=30.5)
LDL Particle Number: 1216 nmol/L — ABNORMAL HIGH (ref <1000)
LDL Size: 21 nm (ref >20.5)
LDL-C: 94 mg/dL (ref 0–99)
Small LDL Particle Number: 422 nmol/L (ref <=527)
Triglycerides by NMR: 104 mg/dL (ref 0–149)

## 2014-07-09 LAB — VITAMIN D 25 HYDROXY (VIT D DEFICIENCY, FRACTURES): Vit D, 25-Hydroxy: 60.4 ng/mL (ref 30.0–100.0)

## 2014-07-09 NOTE — Telephone Encounter (Signed)
-----   Message from Chipper Herb, MD sent at 07/09/2014  7:15 AM EST ----- The blood sugar slightly elevated at 102. The creatinine, the most important kidney function test is within normal limits. The electrolytes including potassium are within normal limits All liver function tests are within normal limits The total LDL particle number is slightly decreased from the value of 3 months ago. It is now 1216. The goal is to be less than 1000. The LDL C is good at 94 the triglycerides are good at 104 and the good cholesterol or the HDL particle number is also good. Continue current treatment and as aggressive therapeutic lifestyle changes as possible. The vitamin D level is excellent, continue current treatment

## 2014-07-09 NOTE — Telephone Encounter (Signed)
Pt aware of lab results 

## 2014-07-22 ENCOUNTER — Encounter: Payer: Self-pay | Admitting: Family Medicine

## 2014-07-22 ENCOUNTER — Ambulatory Visit (INDEPENDENT_AMBULATORY_CARE_PROVIDER_SITE_OTHER): Payer: Medicare Other | Admitting: Family Medicine

## 2014-07-22 VITALS — BP 139/77 | HR 60 | Temp 97.1°F | Ht 65.0 in | Wt 181.0 lb

## 2014-07-22 DIAGNOSIS — E559 Vitamin D deficiency, unspecified: Secondary | ICD-10-CM

## 2014-07-22 DIAGNOSIS — M25512 Pain in left shoulder: Secondary | ICD-10-CM

## 2014-07-22 DIAGNOSIS — I10 Essential (primary) hypertension: Secondary | ICD-10-CM

## 2014-07-22 DIAGNOSIS — M25562 Pain in left knee: Secondary | ICD-10-CM

## 2014-07-22 DIAGNOSIS — E785 Hyperlipidemia, unspecified: Secondary | ICD-10-CM

## 2014-07-22 DIAGNOSIS — K219 Gastro-esophageal reflux disease without esophagitis: Secondary | ICD-10-CM

## 2014-07-22 DIAGNOSIS — M25561 Pain in right knee: Secondary | ICD-10-CM

## 2014-07-22 DIAGNOSIS — E538 Deficiency of other specified B group vitamins: Secondary | ICD-10-CM

## 2014-07-22 NOTE — Patient Instructions (Addendum)
Medicare Annual Wellness Visit  [facility] and the medical providers at [facility] strive to bring you the best medical care.  In doing so we not only want to address your current medical conditions and concerns but also to detect new conditions early and prevent illness, disease and health-related problems.    Medicare offers a yearly Wellness Visit which allows our clinical staff to assess your need for preventative services including immunizations, lifestyle education, counseling to decrease risk of preventable diseases and screening for fall risk and other medical concerns.    This visit is provided free of charge (no copay) for all Medicare recipients. The clinical pharmacists at [facility] have begun to conduct these Wellness Visits which will also include a thorough review of all your medications.    As you primary medical provider recommend that you make an appointment for your Annual Wellness Visit if you have not done so already this year.  You may set up this appointment before you leave today or you may call back ([phone]) and schedule an appointment.  Please make sure when you call that you mention that you are scheduling your Annual Wellness Visit with the clinical pharmacist so that the appointment may be made for the proper length of time.     Continue current medications. Continue good therapeutic lifestyle changes which include good diet and exercise. Fall precautions discussed with patient. If an FOBT was given today- please return it to our front desk. If you are over 62 years old - you may need Prevnar 8 or the adult Pneumonia vaccine.  Flu Shots will be available at our office starting mid- September. Please call and schedule a FLU CLINIC APPOINTMENT.   We will arrange for you to have an appointment with orthopedist at his next visit here regarding your need pain You should call and make an appointment  to see the orthopedist regarding your left shoulder pain and previous surgery you've had for this. Continue physical therapy with the exercise program in the rehabilitation center next-door Continue to be careful and moves slowly and did not put yourself at risk for falling We will also arrange for you to have an appointment with the mid-level for a pelvic exam. Follow through with your visit with the gastroenterologist. Continue to drink plenty of fluids and use MiraLAX and Dulcolax as needed

## 2014-07-22 NOTE — Progress Notes (Signed)
Subjective:    Patient ID: [name], female    DOB: 1941/05/12, 74 y.o.   MRN: [id number]  HPI Pt here for follow up and management of chronic medical problems which include hyperlipidemia and hypertension. She is taking medications regularly. The patient has had a rough several months with multiple falls lacerations etc. She is doing better with this and ask a feeling better. She says her instability seems to be improved. She attributes some of this to her exercise program in the rehabilitation facility next-door. She tells [place] she is due to get her mammogram and we will arrange for her to get that. She has had recent lab work and we will review this with her today. Her health maintenance issues appear to be up-to-date.          Patient Active Problem List   Diagnosis Date Noted  . Osteoporosis with fracture 04/16/2014  . At high risk for falls 04/16/2014  . Proximal humerus fracture 05/30/2011  . CHEST PAIN, PRECORDIAL 02/26/2010  . HYPERLIPIDEMIA 02/23/2010  . DEPRESSION 02/23/2010  . GLAUCOMA 02/23/2010  . CATARACTS 02/23/2010  . RHEUMATIC FEVER 02/23/2010  . HYPERTENSION 02/23/2010  . MITRAL VALVE PROLAPSE 02/23/2010  . GERD 02/23/2010  . OSTEOARTHRITIS 02/23/2010  . FIBROMYALGIA 02/23/2010   Outpatient Encounter Prescriptions as of 07/22/2014  Medication Sig  . aspirin 81 MG tablet Take 1 tablet (81 mg total) by mouth every other day.  Marland Kitchen atenolol (TENORMIN) 50 MG tablet TAKE 1 TABLET DAILY  . Cholecalciferol (VITAMIN D3) 1000 UNITS CAPS Take 1 capsule by mouth daily.    . clorazepate (TRANXENE) 7.5 MG tablet TAKE 1 TABLET DAILY AS NEEDED  . fluticasone (FLONASE) 50 MCG/ACT nasal spray Place 2 sprays into the nose daily.  . hydrocortisone (PROCTOZONE-HC) 2.5 % rectal cream Place 1 application rectally 2 (two) times daily as needed. For constipation  . ibuprofen (ADVIL,MOTRIN) 200 MG tablet Take 200 mg by mouth every 6 (six) hours as needed.  . Multiple  Vitamins-Minerals (SENIOR MULTIVITAMIN PLUS PO) Take by mouth daily.    . Omeprazole Magnesium (PRILOSEC OTC PO) Take 1 capsule by mouth daily. For acid reflux  . simvastatin (ZOCOR) 80 MG tablet TAKE ONE TABLET AT BEDTIME  . TRAVATAN Z 0.004 % SOLN ophthalmic solution INSTILL 1 DROP INTO RIGHT EYE AT BEDTIME  . venlafaxine XR (EFFEXOR-XR) 75 MG 24 hr capsule TAKE (1) CAPSULE DAILY  . Vitamin B1-B12 (NEURO B-12 IJ) Inject 1 vial as directed every 30 (thirty) days.   Marland Kitchen zolpidem (AMBIEN) 10 MG tablet TAKE 1 TABLET AT BEDTIME AS NEEDED FOR SLEEP  . [DISCONTINUED] raloxifene (EVISTA) 60 MG tablet Take 1 tablet (60 mg total) by mouth daily.  . [DISCONTINUED] hydrocortisone (ANUSOL-HC) 25 MG suppository Place 1 suppository (25 mg total) rectally 2 (two) times daily.  . [DISCONTINUED] risedronate (ACTONEL) 150 MG tablet     Review of Systems  Constitutional: Negative.   HENT: Negative.   Eyes: Negative.   Respiratory: Negative.   Cardiovascular: Negative.   Gastrointestinal: Negative.   Endocrine: Negative.   Genitourinary: Negative.   Musculoskeletal: Negative.   Skin: Negative.   Allergic/Immunologic: Negative.   Neurological: Negative.        Balance issues are better.   Hematological: Negative.   Psychiatric/Behavioral: Negative.        Objective:   Physical Exam  Constitutional: She is oriented to person, place, and time. She appears well-developed and well-nourished. No distress.  The patient is doing well and  is in good spirits.  HENT:  Head: Normocephalic and atraumatic.  Right Ear: External ear normal.  Left Ear: External ear normal.  Nose: Nose normal.  Mouth/Throat: Oropharynx is clear and moist. No oropharyngeal exudate.  Eyes: Conjunctivae and EOM are normal. Pupils are equal, round, and reactive to light. Right eye exhibits no discharge. Left eye exhibits no discharge. No scleral icterus.  Neck: Normal range of motion. Neck supple. No thyromegaly present.  The neck  was without carotid bruits or adenopathy  Cardiovascular: Normal rate, regular rhythm, normal heart sounds and intact distal pulses.   No murmur heard. The rhythm was 72/m and regular  Pulmonary/Chest: Effort normal and breath sounds normal. No respiratory distress. She has no wheezes. She has no rales. She exhibits no tenderness.  Abdominal: Soft. Bowel sounds are normal. She exhibits no mass. There is no tenderness. There is no rebound and no guarding.  The abdomen was nontender without masses bruits or inguinal adenopathy  Musculoskeletal: Normal range of motion. She exhibits no edema or tenderness.  There is some pain with raising the left shoulder with limited mobility of the shoulder. There is also some discomfort in the right knee with mobility.  Lymphadenopathy:    She has no cervical adenopathy.  Neurological: She is alert and oriented to person, place, and time. She has normal reflexes. No cranial nerve deficit.  Skin: Skin is warm and dry. No rash noted.  Psychiatric: She has a normal mood and affect. Her behavior is normal. Judgment and thought content normal.  Nursing note and vitals reviewed.  BP 139/77 mmHg  Pulse 60  Temp(Src) 97.1 F (36.2 C) (Oral)  Ht 5\' 5"  (1.651 m)  Wt 181 lb (82.101 kg)  BMI 30.12 kg/m2        Assessment & Plan:  1. Hyperlipidemia -Labs were reviewed today and the patient's cholesterol is only slightly above the goal and she will try to do better with exercise and will continue with her simvastatin.  2. Essential hypertension -Blood pressure was good today and the patient should continue with her current medication.  3. Vitamin D deficiency -The vitamin D was excellent and she should continue with her vitamin D one daily  4. Gastroesophageal reflux disease, esophagitis presence not specified -The patient's reflux is also stable and she should continue with her omeprazole 1 capsule daily  5. Vitamin B 12 deficiency -B-12 levels were not  checked on this visit and the patient should continue with her current vitamin D injections  6. Left shoulder pain -The patient will call and make an appointment with Dr. Laurina Bustle who did her previous surgery  7. Bilateral knee pain -Patient will be given an appointment to see the orthopedic surgeon regarding her right and left knee pain.  Patient Instructions                       Medicare Annual Wellness Visit  [facility] and the medical providers at [facility] strive to bring you the best medical care.  In doing so we not only want to address your current medical conditions and concerns but also to detect new conditions early and prevent illness, disease and health-related problems.    Medicare offers a yearly Wellness Visit which allows our clinical staff to assess your need for preventative services including immunizations, lifestyle education, counseling to decrease risk of preventable diseases and screening for fall risk and other medical concerns.    This visit is  provided free of charge (no copay) for all Medicare recipients. The clinical pharmacists at [facility] have begun to conduct these Wellness Visits which will also include a thorough review of all your medications.    As you primary medical provider recommend that you make an appointment for your Annual Wellness Visit if you have not done so already this year.  You may set up this appointment before you leave today or you may call back ([phone]) and schedule an appointment.  Please make sure when you call that you mention that you are scheduling your Annual Wellness Visit with the clinical pharmacist so that the appointment may be made for the proper length of time.     Continue current medications. Continue good therapeutic lifestyle changes which include good diet and exercise. Fall precautions discussed with patient. If an FOBT was given today- please return it to our front  desk. If you are over 66 years old - you may need Prevnar 72 or the adult Pneumonia vaccine.  Flu Shots will be available at our office starting mid- September. Please call and schedule a FLU CLINIC APPOINTMENT.   We will arrange for you to have an appointment with orthopedist at his next visit here regarding your need pain You should call and make an appointment to see the orthopedist regarding your left shoulder pain and previous surgery you've had for this. Continue physical therapy with the exercise program in the rehabilitation center next-door Continue to be careful and moves slowly and did not put yourself at risk for falling We will also arrange for you to have an appointment with the mid-level for a pelvic exam. Follow through with your visit with the gastroenterologist. Continue to drink plenty of fluids and use MiraLAX and Dulcolax as needed   Arrie Senate MD

## 2014-07-28 ENCOUNTER — Other Ambulatory Visit: Payer: Self-pay

## 2014-07-28 DIAGNOSIS — Z1231 Encounter for screening mammogram for malignant neoplasm of breast: Secondary | ICD-10-CM

## 2014-07-30 ENCOUNTER — Ambulatory Visit
Admission: RE | Admit: 2014-07-30 | Discharge: 2014-07-30 | Disposition: A | Discharge status: Home / Self Care | Payer: Medicare Other | Source: Ambulatory Visit

## 2014-07-30 DIAGNOSIS — Z1231 Encounter for screening mammogram for malignant neoplasm of breast: Secondary | ICD-10-CM

## 2014-08-08 ENCOUNTER — Other Ambulatory Visit: Payer: Self-pay | Admitting: Family Medicine

## 2014-08-11 NOTE — Telephone Encounter (Signed)
Patient last seen in office on 07-22-14. Rx last filled on 07-16-13 for #30 with [facility]. Please advise. If approved please phone in to [facility]

## 2014-08-15 ENCOUNTER — Ambulatory Visit (INDEPENDENT_AMBULATORY_CARE_PROVIDER_SITE_OTHER): Payer: Medicare Other | Admitting: *Deleted

## 2014-08-15 DIAGNOSIS — E538 Deficiency of other specified B group vitamins: Secondary | ICD-10-CM

## 2014-08-15 NOTE — Progress Notes (Signed)
Pt given B12 injection IM right deltoid, pt tolerated injection well. 

## 2014-08-15 NOTE — Patient Instructions (Signed)

## 2014-08-27 ENCOUNTER — Encounter: Payer: Self-pay | Admitting: Family Medicine

## 2014-08-27 ENCOUNTER — Ambulatory Visit (INDEPENDENT_AMBULATORY_CARE_PROVIDER_SITE_OTHER): Payer: Medicare Other | Admitting: Family Medicine

## 2014-08-27 VITALS — BP 134/76 | HR 79 | Temp 97.3°F | Ht 65.0 in | Wt 176.6 lb

## 2014-08-27 DIAGNOSIS — J202 Acute bronchitis due to streptococcus: Secondary | ICD-10-CM | POA: Diagnosis not present

## 2014-08-27 MED ORDER — HYDROCODONE-HOMATROPINE 5-1.5 MG/5ML PO SYRP: 5.0000 mL | ORAL_SOLUTION | Freq: Four times a day (QID) | ORAL | Status: DC | PRN

## 2014-08-27 MED ORDER — AMOXICILLIN-POT CLAVULANATE 875-125 MG PO TABS: 1.0000 | ORAL_TABLET | Freq: Two times a day (BID) | ORAL | Status: DC

## 2014-08-27 NOTE — Progress Notes (Signed)
Subjective:  Patient ID: [name], female    DOB: 1940/08/23  Age: 74 y.o. MRN: [id number]  CC: URI   HPI CHASEY DULL presents for cough with chesttightness. Productive of yellow sputum. Wheezing last week Had fever and sweats.  History [name] has a past medical history of Helicobacter pylori (H. pylori); Hiatal hernia; Other and unspecified hyperlipidemia; Fibromyalgia; Osteoarthritis; Hyperlipidemia; Hypertension; ACL tear; Glaucoma; Arthritis; Post-menopausal; Depression; B12 deficiency; Tremor; DUB (dysfunctional uterine bleeding) (10/96); Insomnia; Constipation; ETD (eustachian tube dysfunction); MVP (mitral valve prolapse); Adenomatous polyp; Osteopenia; Cataracts, both eyes (10/2006); Anxiety; Glaucoma; and Osteoporosis.   She has past surgical history that includes right knee replacement (3/11); rt. eye cataract (05/28/07); Dilation and curettage of uterus (03/31/95); Cholecystectomy (1979); Ventral hernia repair (2/99); Bunionectomy (08/1999); and ORIF humerus fracture (05/30/2011).   Her family history includes Arthritis in her father; Cancer in her mother and sister; Heart attack (age of onset: 10) in her father; Heart disease in her father; Hip fracture in her mother; Hypertension in her sister and sister; Osteoporosis in her mother.She reports that she has never smoked. She has never used smokeless tobacco. She reports that she does not drink alcohol or use illicit drugs.  Current Outpatient Prescriptions on File Prior to Visit  Medication Sig Dispense Refill  . aspirin 81 MG tablet Take 1 tablet (81 mg total) by mouth every other day. 30 tablet   . atenolol (TENORMIN) 50 MG tablet TAKE 1 TABLET DAILY 30 tablet 4  . Cholecalciferol (VITAMIN D3) 1000 UNITS CAPS Take 1 capsule by mouth daily.      . clorazepate (TRANXENE) 7.5 MG tablet TAKE 1 TABLET DAILY AS NEEDED 30 tablet 2  . fluticasone (FLONASE) 50 MCG/ACT nasal spray Place 2 sprays into the nose daily. 16 g 6  .  hydrocortisone (PROCTOZONE-HC) 2.5 % rectal cream Place 1 application rectally 2 (two) times daily as needed. For constipation 30 g 2  . ibuprofen (ADVIL,MOTRIN) 200 MG tablet Take 200 mg by mouth every 6 (six) hours as needed.    . Multiple Vitamins-Minerals (SENIOR MULTIVITAMIN PLUS PO) Take by mouth daily.      . Omeprazole Magnesium (PRILOSEC OTC PO) Take 1 capsule by mouth daily. For acid reflux    . simvastatin (ZOCOR) 80 MG tablet TAKE ONE TABLET AT BEDTIME 30 tablet 3  . TRAVATAN Z 0.004 % SOLN ophthalmic solution INSTILL 1 DROP INTO RIGHT EYE AT BEDTIME 2.5 mL 2  . venlafaxine XR (EFFEXOR-XR) 75 MG 24 hr capsule TAKE (1) CAPSULE DAILY 30 capsule 3  . Vitamin B1-B12 (NEURO B-12 IJ) Inject 1 vial as directed every 30 (thirty) days.     Marland Kitchen zolpidem (AMBIEN) 10 MG tablet TAKE 1 TABLET AT BEDTIME AS NEEDED FOR SLEEP 30 tablet 1   Current Facility-Administered Medications on File Prior to Visit  Medication Dose Route Frequency Provider Last Rate Last Dose  . cyanocobalamin ((VITAMIN B-12)) injection 1,000 mcg  1,000 mcg Intramuscular Q30 days Chipper Herb, MD   1,000 mcg at 08/15/14 0945    ROS Review of Systems  Objective:  BP 134/76 mmHg  Pulse 79  Temp(Src) 97.3 F (36.3 C) (Oral)  Ht 5\' 5"  (1.651 m)  Wt 176 lb 9.6 oz (80.105 kg)  BMI 29.39 kg/m2  BP Readings from Last 3 Encounters:  08/27/14 134/76  07/22/14 139/77  07/03/14 167/76    Wt Readings from Last 3 Encounters:  08/27/14 176 lb 9.6 oz (80.105 kg)  07/22/14 181 lb (82.101  kg)  07/03/14 184 lb (83.462 kg)     Physical Exam  No results found for: HGBA1C  Lab Results  Component Value Date   WBC 4.9 07/08/2014   HGB 12.1* 07/08/2014   HCT 38.3 07/08/2014   PLT 313 10/29/2013   GLUCOSE 102* 07/08/2014   CHOL 183 07/08/2014   TRIG 104 07/08/2014   HDL 68 07/08/2014   LDLCALC 91 03/14/2014   ALT 10 07/08/2014   AST 21 07/08/2014   NA 144 07/08/2014   K 4.3 07/08/2014   CL 103 07/08/2014    CREATININE 0.65 07/08/2014   BUN 9 07/08/2014   CO2 27 07/08/2014   INR 1.04 05/30/2011    Mm Digital Screening Bilateral  08/01/2014   CLINICAL DATA:  Screening.  EXAM: DIGITAL SCREENING BILATERAL MAMMOGRAM WITH CAD  COMPARISON:  Previous exam(s).  ACR Breast Density Category b: There are scattered areas of fibroglandular density.  FINDINGS: There are no findings suspicious for malignancy. Images were processed with CAD.  IMPRESSION: No mammographic evidence of malignancy. A result letter of this screening mammogram will be mailed directly to the patient.  RECOMMENDATION: Screening mammogram in one year. (Code:SM-B-01Y)  BI-RADS CATEGORY  1: Negative.   Electronically Signed   By: Hassan Rowan M.D.   On: 08/01/2014 11:46    Assessment & Plan:   Makana was seen today for uri.  Diagnoses and all orders for this visit:  Acute bronchitis due to Streptococcus  Other orders -     HYDROcodone-homatropine (HYCODAN) 5-1.5 MG/5ML syrup; Take 5 mLs by mouth every 6 (six) hours as needed for cough. -     amoxicillin-clavulanate (AUGMENTIN) 875-125 MG per tablet; Take 1 tablet by mouth 2 (two) times daily.  I am having Ms. Bunten start on HYDROcodone-homatropine and amoxicillin-clavulanate. I am also having her maintain her Vitamin B1-B12 (NEURO B-12 IJ), Multiple Vitamins-Minerals (SENIOR MULTIVITAMIN PLUS PO), Omeprazole Magnesium (PRILOSEC OTC PO), Vitamin D3, fluticasone, TRAVATAN Z, ibuprofen, aspirin, venlafaxine XR, atenolol, zolpidem, simvastatin, hydrocortisone, and clorazepate. We will continue to administer cyanocobalamin.  Meds ordered this encounter  Medications  . HYDROcodone-homatropine (HYCODAN) 5-1.5 MG/5ML syrup    Sig: Take 5 mLs by mouth every 6 (six) hours as needed for cough.    Dispense:  120 mL    Refill:  0  . amoxicillin-clavulanate (AUGMENTIN) 875-125 MG per tablet    Sig: Take 1 tablet by mouth 2 (two) times daily.    Dispense:  20 tablet    Refill:  0     Follow-up:  Return if symptoms worsen or fail to improve.  Claretta Fraise, M.D.

## 2014-09-11 ENCOUNTER — Other Ambulatory Visit: Payer: Medicare Other | Admitting: Nurse Practitioner

## 2014-09-12 ENCOUNTER — Other Ambulatory Visit: Payer: Self-pay | Admitting: Family Medicine

## 2014-09-16 ENCOUNTER — Ambulatory Visit (INDEPENDENT_AMBULATORY_CARE_PROVIDER_SITE_OTHER): Payer: Medicare Other | Admitting: *Deleted

## 2014-09-16 DIAGNOSIS — E538 Deficiency of other specified B group vitamins: Secondary | ICD-10-CM | POA: Diagnosis not present

## 2014-09-25 ENCOUNTER — Encounter: Payer: Self-pay | Admitting: Family

## 2014-09-25 ENCOUNTER — Ambulatory Visit (INDEPENDENT_AMBULATORY_CARE_PROVIDER_SITE_OTHER): Payer: Medicare Other | Admitting: Family

## 2014-09-25 VITALS — BP 154/73 | HR 60 | Temp 97.5°F | Ht 65.0 in | Wt 178.6 lb

## 2014-09-25 DIAGNOSIS — B373 Candidiasis of vulva and vagina: Secondary | ICD-10-CM

## 2014-09-25 DIAGNOSIS — R309 Painful micturition, unspecified: Secondary | ICD-10-CM

## 2014-09-25 DIAGNOSIS — B3731 Acute candidiasis of vulva and vagina: Secondary | ICD-10-CM

## 2014-09-25 LAB — POCT UA - MICROSCOPIC ONLY
Bacteria, U Microscopic: NEGATIVE
CASTS, UR, LPF, POC: NEGATIVE
Mucus, UA: NEGATIVE
WBC, Ur, HPF, POC: NEGATIVE

## 2014-09-25 LAB — POCT URINALYSIS DIPSTICK
Bilirubin, UA: NEGATIVE
GLUCOSE UA: NEGATIVE
KETONES UA: NEGATIVE
LEUKOCYTES UA: NEGATIVE
NITRITE UA: NEGATIVE
Protein, UA: NEGATIVE
RBC UA: NEGATIVE
Urobilinogen, UA: NEGATIVE
pH, UA: 5

## 2014-09-25 LAB — POCT WET PREP (WET MOUNT): KOH WET PREP POC: POSITIVE

## 2014-09-25 MED ORDER — FLUCONAZOLE 150 MG PO TABS: 150.0000 mg | ORAL_TABLET | Freq: Once | ORAL | Status: DC

## 2014-09-25 MED ORDER — FLUCONAZOLE 150 MG PO TABS: 150.0000 mg | ORAL_TABLET | Freq: Every day | ORAL | Status: DC

## 2014-09-25 NOTE — Progress Notes (Signed)
   Subjective:    Patient ID: [name], female    DOB: 15-Jun-1941, 74 y.o.   MRN: [id number]  Dysuria  This is a new problem. The current episode started 1 to 4 weeks ago (Two weeks). The problem occurs intermittently. The problem has been unchanged. The quality of the pain is described as burning. The pain is at a severity of 5/10. The pain is moderate. Associated symptoms include flank pain and urgency. Pertinent negatives include no discharge, frequency, hematuria, hesitancy, nausea or vomiting. She has tried increased fluids and acetaminophen (AZO) for the symptoms. The treatment provided mild relief.      Review of Systems  Constitutional: Negative.   HENT: Negative.   Eyes: Negative.   Respiratory: Negative.  Negative for shortness of breath.   Cardiovascular: Negative.  Negative for palpitations.  Gastrointestinal: Negative.  Negative for nausea and vomiting.  Endocrine: Negative.   Genitourinary: Positive for dysuria, urgency and flank pain. Negative for hesitancy, frequency and hematuria.  Musculoskeletal: Negative.   Neurological: Negative.  Negative for headaches.  Hematological: Negative.   Psychiatric/Behavioral: Negative.   All other systems reviewed and are negative.      Objective:   Physical Exam  Constitutional: She is oriented to person, place, and time. She appears well-developed and well-nourished. No distress.  HENT:  Head: Normocephalic and atraumatic.  Right Ear: External ear normal.  Left Ear: External ear normal.  Nose: Nose normal.  Mouth/Throat: Oropharynx is clear and moist.  Eyes: Pupils are equal, round, and reactive to light.  Neck: Normal range of motion. Neck supple. No thyromegaly present.  Cardiovascular: Normal rate, regular rhythm, normal heart sounds and intact distal pulses.   No murmur heard. Pulmonary/Chest: Effort normal and breath sounds normal. No respiratory distress. She has no wheezes.  Abdominal: Soft. Bowel sounds are  normal. She exhibits no distension. There is no tenderness.  Musculoskeletal: Normal range of motion. She exhibits edema. She exhibits no tenderness.  Negative for CVA tenderness  Neurological: She is alert and oriented to person, place, and time. She has normal reflexes. No cranial nerve deficit.  Skin: Skin is warm and dry.  Psychiatric: She has a normal mood and affect. Her behavior is normal. Judgment and thought content normal.  Vitals reviewed.     BP 154/73 mmHg  Pulse 60  Temp(Src) 97.5 F (36.4 C) (Oral)  Ht 5\' 5"  (1.651 m)  Wt 178 lb 9.6 oz (81.012 kg)  BMI 29.72 kg/m2     Assessment & Plan:  1. Voiding pain - POCT Wet Prep [facility]) - POCT urinalysis dipstick - POCT UA - Microscopic Only - fluconazole (DIFLUCAN) 150 MG tablet; Take 1 tablet (150 mg total) by mouth once.  Dispense: 1 tablet; Refill: 2  2. Vaginal yeast infection -Keep area clean and dry -Cotton underwear -Force fluids -RTO prn - fluconazole (DIFLUCAN) 150 MG tablet; Take 1 tablet (150 mg total) by mouth once.  Dispense: 1 tablet; Refill: [place], FNP

## 2014-09-25 NOTE — Patient Instructions (Addendum)

## 2014-09-30 ENCOUNTER — Other Ambulatory Visit: Payer: Self-pay | Admitting: Family Medicine

## 2014-10-01 NOTE — Telephone Encounter (Signed)
Refill called to [facility] 

## 2014-10-01 NOTE — Telephone Encounter (Signed)
Last seen 09/25/14 [name]  If approved route to nurse to call into [facility]

## 2014-10-06 ENCOUNTER — Other Ambulatory Visit: Payer: Self-pay | Admitting: Nurse Practitioner

## 2014-10-08 ENCOUNTER — Ambulatory Visit (INDEPENDENT_AMBULATORY_CARE_PROVIDER_SITE_OTHER): Payer: Medicare Other | Admitting: Family Medicine

## 2014-10-08 ENCOUNTER — Encounter: Payer: Self-pay | Admitting: Family Medicine

## 2014-10-08 VITALS — BP 152/81 | HR 85 | Temp 96.9°F | Ht 65.0 in | Wt 178.0 lb

## 2014-10-08 DIAGNOSIS — L03115 Cellulitis of right lower limb: Secondary | ICD-10-CM | POA: Diagnosis not present

## 2014-10-08 MED ORDER — CEFTRIAXONE SODIUM 1 G IJ SOLR
1.0000 g | Freq: Once | INTRAMUSCULAR | Status: AC
  Administered 2014-10-08: 1 g via INTRAMUSCULAR

## 2014-10-08 MED ORDER — SULFAMETHOXAZOLE-TRIMETHOPRIM 800-160 MG PO TABS: 1.0000 | ORAL_TABLET | Freq: Two times a day (BID) | ORAL | Status: DC

## 2014-10-08 NOTE — Patient Instructions (Signed)
Elevate the foot Take the antibiotic as directed Return to clinic in 2 days for recheck Wear a white sock to help keep the area clean Clean foot gently with warm soapy water and rinse

## 2014-10-08 NOTE — Addendum Note (Signed)
Addended by: Zannie Cove on: 10/08/2014 05:12 PM   Modules accepted: Orders

## 2014-10-08 NOTE — Progress Notes (Signed)
Subjective:    Patient ID: [name], female    DOB: 26-Oct-1940, 74 y.o.   MRN: [id number]  HPI Patient here today for right foot pain, heat and redness that was first noticed on Sunday morning. The patient does not recall any injury. She does not recall any insect bite.        Patient Active Problem List   Diagnosis Date Noted  . Osteoporosis with fracture 04/16/2014  . At high risk for falls 04/16/2014  . Proximal humerus fracture 05/30/2011  . CHEST PAIN, PRECORDIAL 02/26/2010  . HYPERLIPIDEMIA 02/23/2010  . DEPRESSION 02/23/2010  . GLAUCOMA 02/23/2010  . CATARACTS 02/23/2010  . RHEUMATIC FEVER 02/23/2010  . HYPERTENSION 02/23/2010  . MITRAL VALVE PROLAPSE 02/23/2010  . GERD 02/23/2010  . OSTEOARTHRITIS 02/23/2010  . FIBROMYALGIA 02/23/2010   Outpatient Encounter Prescriptions as of 10/08/2014  Medication Sig  . aspirin 81 MG tablet Take 1 tablet (81 mg total) by mouth every other day.  Marland Kitchen atenolol (TENORMIN) 50 MG tablet TAKE 1 TABLET DAILY  . Cholecalciferol (VITAMIN D3) 1000 UNITS CAPS Take 1 capsule by mouth daily.    . clorazepate (TRANXENE) 7.5 MG tablet TAKE 1 TABLET DAILY AS NEEDED  . fluconazole (DIFLUCAN) 150 MG tablet Take 1 tablet (150 mg total) by mouth daily.  . fluticasone (FLONASE) 50 MCG/ACT nasal spray Place 2 sprays into the nose daily.  Marland Kitchen ibuprofen (ADVIL,MOTRIN) 200 MG tablet Take 200 mg by mouth every 6 (six) hours as needed.  . Multiple Vitamins-Minerals (SENIOR MULTIVITAMIN PLUS PO) Take by mouth daily.    . Omeprazole Magnesium (PRILOSEC OTC PO) Take 1 capsule by mouth daily. For acid reflux  . PROCTOZONE-HC 2.5 % rectal cream APPLY TO RECTALLY 2 TIMES A DAY AS NEEDED  . simvastatin (ZOCOR) 80 MG tablet TAKE ONE TABLET AT BEDTIME  . TRAVATAN Z 0.004 % SOLN ophthalmic solution INSTILL 1 DROP INTO RIGHT EYE AT BEDTIME  . venlafaxine XR (EFFEXOR-XR) 75 MG 24 hr capsule TAKE (1) CAPSULE DAILY  . Vitamin B1-B12 (NEURO B-12 IJ) Inject 1 vial as  directed every 30 (thirty) days.   Marland Kitchen zolpidem (AMBIEN) 10 MG tablet TAKE 1/2 TABLET AT BEDTIME AS NEEDED FOR SLEEP     Review of Systems  Constitutional: Negative.   HENT: Negative.   Eyes: Negative.   Respiratory: Negative.   Cardiovascular: Negative.   Gastrointestinal: Negative.   Endocrine: Negative.   Genitourinary: Negative.   Musculoskeletal: Negative.   Skin: Negative.  Color change: red, hot , pain - right foot.  Allergic/Immunologic: Negative.   Neurological: Negative.   Hematological: Negative.   Psychiatric/Behavioral: Negative.        Objective:   Physical Exam  Constitutional: She is oriented to person, place, and time. She appears well-developed and well-nourished. No distress.  Neurological: She is alert and oriented to person, place, and time.  Skin: Skin is warm and dry. Rash noted. There is erythema. No pallor.  There is redness and rubor and tenderness to the right dorsal foot with slight edema. There is a tiny red spot on the dorsal second toe with proximal redness. There is no apparent break in the skin and there is no drainage  Psychiatric: She has a normal mood and affect. Her behavior is normal. Thought content normal.  Nursing note and vitals reviewed.  BP 152/81 mmHg  Pulse 85  Temp(Src) 96.9 F (36.1 C) (Oral)  Ht 5\' 5"  (1.651 m)  Wt 178 lb (80.74 kg)  BMI 29.62  kg/m2        Assessment & Plan:  1. Cellulitis of right foot -1 g of Rocephin IM - sulfamethoxazole-trimethoprim (BACTRIM DS,SEPTRA DS) 800-160 MG per tablet; Take 1 tablet by mouth 2 (two) times daily.  Dispense: 20 tablet; Refill: 0 -Clean foot gently with warm soapy water and rinse -Wear white socks to protect from any further infection  Patient Instructions  Elevate the foot Take the antibiotic as directed Return to clinic in 2 days for recheck Wear a white sock to help keep the area clean Clean foot gently with warm soapy water and rinse   Arrie Senate MD

## 2014-10-10 ENCOUNTER — Ambulatory Visit (INDEPENDENT_AMBULATORY_CARE_PROVIDER_SITE_OTHER): Payer: Medicare Other | Admitting: Family Medicine

## 2014-10-10 ENCOUNTER — Encounter: Payer: Self-pay | Admitting: Family Medicine

## 2014-10-10 VITALS — BP 130/74 | HR 59 | Temp 97.6°F | Ht 65.0 in | Wt 170.0 lb

## 2014-10-10 DIAGNOSIS — L03115 Cellulitis of right lower limb: Secondary | ICD-10-CM

## 2014-10-10 NOTE — Patient Instructions (Signed)
Continue with antibiotic Keep it elevated when you are sitting Continue cleansing with warm soapy water rinsing thoroughly Avoid scent free fabric softeners, detergents, and soaps

## 2014-10-10 NOTE — Progress Notes (Signed)
Subjective:    Patient ID: [name], female    DOB: 23-May-1941, 73 y.o.   MRN: [id number]  HPI   74 year old female comes in today to follow up on cellulitis to right foot. She is taking her antibiotics as prescribed and has no complaints. Continues to have redness and tenderness.   Patient Active Problem List   Diagnosis Date Noted  . Osteoporosis with fracture 04/16/2014  . At high risk for falls 04/16/2014  . Proximal humerus fracture 05/30/2011  . CHEST PAIN, PRECORDIAL 02/26/2010  . HYPERLIPIDEMIA 02/23/2010  . DEPRESSION 02/23/2010  . GLAUCOMA 02/23/2010  . CATARACTS 02/23/2010  . RHEUMATIC FEVER 02/23/2010  . HYPERTENSION 02/23/2010  . MITRAL VALVE PROLAPSE 02/23/2010  . GERD 02/23/2010  . OSTEOARTHRITIS 02/23/2010  . FIBROMYALGIA 02/23/2010   Outpatient Encounter Prescriptions as of 10/10/2014  Medication Sig  . aspirin 81 MG tablet Take 1 tablet (81 mg total) by mouth every other day.  Marland Kitchen atenolol (TENORMIN) 50 MG tablet TAKE 1 TABLET DAILY  . Cholecalciferol (VITAMIN D3) 1000 UNITS CAPS Take 1 capsule by mouth daily.    . clorazepate (TRANXENE) 7.5 MG tablet TAKE 1 TABLET DAILY AS NEEDED  . fluticasone (FLONASE) 50 MCG/ACT nasal spray Place 2 sprays into the nose daily.  Marland Kitchen ibuprofen (ADVIL,MOTRIN) 200 MG tablet Take 200 mg by mouth every 6 (six) hours as needed.  . Multiple Vitamins-Minerals (SENIOR MULTIVITAMIN PLUS PO) Take by mouth daily.    . Omeprazole Magnesium (PRILOSEC OTC PO) Take 1 capsule by mouth daily. For acid reflux  . PROCTOZONE-HC 2.5 % rectal cream APPLY TO RECTALLY 2 TIMES A DAY AS NEEDED  . simvastatin (ZOCOR) 80 MG tablet TAKE ONE TABLET AT BEDTIME  . sulfamethoxazole-trimethoprim (BACTRIM DS,SEPTRA DS) 800-160 MG per tablet Take 1 tablet by mouth 2 (two) times daily.  . TRAVATAN Z 0.004 % SOLN ophthalmic solution INSTILL 1 DROP INTO RIGHT EYE AT BEDTIME  . venlafaxine XR (EFFEXOR-XR) 75 MG 24 hr capsule TAKE (1) CAPSULE DAILY  . Vitamin  B1-B12 (NEURO B-12 IJ) Inject 1 vial as directed every 30 (thirty) days.   Marland Kitchen zolpidem (AMBIEN) 10 MG tablet TAKE 1/2 TABLET AT BEDTIME AS NEEDED FOR SLEEP  . [DISCONTINUED] fluconazole (DIFLUCAN) 150 MG tablet Take 1 tablet (150 mg total) by mouth daily.     Review of Systems     Objective:   Physical Exam  Constitutional: She is oriented to person, place, and time. She appears well-developed and well-nourished. No distress.  Musculoskeletal: Normal range of motion. She exhibits edema (there is slight edema in the foot but no more than previously.).  Neurological: She is alert and oriented to person, place, and time.  Skin: Skin is warm and dry. Rash noted. There is erythema. No pallor.  The foot still has redness but no more rubor. The skin is peeling some.  Psychiatric: She has a normal mood and affect. Her behavior is normal. Judgment and thought content normal.  Nursing note and vitals reviewed.  BP 130/74 mmHg  Pulse 59  Temp(Src) 97.6 F (36.4 C) (Oral)  Ht 5\' 5"  (1.651 m)  Wt 170 lb (77.111 kg)  BMI 28.29 kg/m2        Assessment & Plan:  1. Cellulitis of right foot -This appears to be improving slightly -She should continue with her Septra DS twice daily -Keep the foot elevated when sitting -Return to clinic in 4 days  Patient Instructions  Continue with antibiotic Keep it elevated  when you are sitting Continue cleansing with warm soapy water rinsing thoroughly Avoid scent free fabric softeners, detergents, and soaps   Arrie Senate MD

## 2014-10-14 ENCOUNTER — Encounter: Payer: Self-pay | Admitting: Family Medicine

## 2014-10-14 ENCOUNTER — Ambulatory Visit (INDEPENDENT_AMBULATORY_CARE_PROVIDER_SITE_OTHER): Payer: Medicare Other | Admitting: Family Medicine

## 2014-10-14 VITALS — BP 132/68 | HR 63 | Temp 97.3°F | Ht 65.0 in | Wt 176.0 lb

## 2014-10-14 DIAGNOSIS — L03119 Cellulitis of unspecified part of limb: Secondary | ICD-10-CM

## 2014-10-14 MED ORDER — SULFAMETHOXAZOLE-TRIMETHOPRIM 800-160 MG PO TABS: 1.0000 | ORAL_TABLET | Freq: Two times a day (BID) | ORAL | Status: DC

## 2014-10-14 NOTE — Patient Instructions (Signed)
Continue one more week's worth of antibiotics and then discontinue antibiotic use. If there is a flare or increasing redness please get back in touch with [place]. Wear socks and shoes regularly

## 2014-10-14 NOTE — Progress Notes (Signed)
Subjective:    Patient ID: [name], female    DOB: Oct 09, 1940, 74 y.o.   MRN: [id number]  HPI Patient here today for follow up on cellulitis of right foot. She states she is still on the antibiotic that was given and it is looking better. The cellulitis is much improved and the patient is feeling better about this.          Patient Active Problem List   Diagnosis Date Noted  . Osteoporosis with fracture 04/16/2014  . At high risk for falls 04/16/2014  . Proximal humerus fracture 05/30/2011  . CHEST PAIN, PRECORDIAL 02/26/2010  . HYPERLIPIDEMIA 02/23/2010  . DEPRESSION 02/23/2010  . GLAUCOMA 02/23/2010  . CATARACTS 02/23/2010  . RHEUMATIC FEVER 02/23/2010  . HYPERTENSION 02/23/2010  . MITRAL VALVE PROLAPSE 02/23/2010  . GERD 02/23/2010  . OSTEOARTHRITIS 02/23/2010  . FIBROMYALGIA 02/23/2010   Outpatient Encounter Prescriptions as of 10/14/2014  Medication Sig  . aspirin 81 MG tablet Take 1 tablet (81 mg total) by mouth every other day.  Marland Kitchen atenolol (TENORMIN) 50 MG tablet TAKE 1 TABLET DAILY  . Cholecalciferol (VITAMIN D3) 1000 UNITS CAPS Take 1 capsule by mouth daily.    . clorazepate (TRANXENE) 7.5 MG tablet TAKE 1 TABLET DAILY AS NEEDED  . fluticasone (FLONASE) 50 MCG/ACT nasal spray Place 2 sprays into the nose daily.  Marland Kitchen ibuprofen (ADVIL,MOTRIN) 200 MG tablet Take 200 mg by mouth every 6 (six) hours as needed.  . Multiple Vitamins-Minerals (SENIOR MULTIVITAMIN PLUS PO) Take by mouth daily.    . Omeprazole Magnesium (PRILOSEC OTC PO) Take 1 capsule by mouth daily. For acid reflux  . PROCTOZONE-HC 2.5 % rectal cream APPLY TO RECTALLY 2 TIMES A DAY AS NEEDED  . simvastatin (ZOCOR) 80 MG tablet TAKE ONE TABLET AT BEDTIME  . sulfamethoxazole-trimethoprim (BACTRIM DS,SEPTRA DS) 800-160 MG per tablet Take 1 tablet by mouth 2 (two) times daily.  . TRAVATAN Z 0.004 % SOLN ophthalmic solution INSTILL 1 DROP INTO RIGHT EYE AT BEDTIME  . venlafaxine XR (EFFEXOR-XR) 75 MG 24  hr capsule TAKE (1) CAPSULE DAILY  . Vitamin B1-B12 (NEURO B-12 IJ) Inject 1 vial as directed every 30 (thirty) days.   Marland Kitchen zolpidem (AMBIEN) 10 MG tablet TAKE 1/2 TABLET AT BEDTIME AS NEEDED FOR SLEEP    Review of Systems  Constitutional: Negative.   HENT: Negative.   Eyes: Negative.   Respiratory: Negative.   Cardiovascular: Negative.   Gastrointestinal: Negative.   Endocrine: Negative.   Genitourinary: Negative.   Musculoskeletal: Negative.   Skin: Negative.        Right foot cellulitis  Allergic/Immunologic: Negative.   Neurological: Negative.   Hematological: Negative.   Psychiatric/Behavioral: Negative.        Objective:   Physical Exam  Constitutional: She appears well-developed and well-nourished. No distress.  Neurological: She is alert.  Skin: Skin is warm and dry. No rash noted. No erythema. No pallor.  The cellulitis is much improved. There is more discoloration but lack of rubor or erythema. There is no edema. The skin is much improved.  Psychiatric: She has a normal mood and affect. Her behavior is normal. Judgment and thought content normal.   BP 132/68 mmHg  Pulse 63  Temp(Src) 97.3 F (36.3 C) (Oral)  Ht 5\' 5"  (1.651 m)  Wt 176 lb (79.833 kg)  BMI 29.29 kg/m2        Assessment & Plan:  1. Cellulitis of foot -The patient will continue to wear protective  socks and will take the antibiotic for one more week and then we'll discontinue antibiotic use. - sulfamethoxazole-trimethoprim (BACTRIM DS,SEPTRA DS) 800-160 MG per tablet; Take 1 tablet by mouth 2 (two) times daily.  Dispense: 14 tablet; Refill: 0  Patient Instructions  Continue one more week's worth of antibiotics and then discontinue antibiotic use. If there is a flare or increasing redness please get back in touch with [place]. Wear socks and shoes regularly   Arrie Senate MD

## 2014-10-20 ENCOUNTER — Ambulatory Visit: Payer: Medicare Other | Admitting: Family

## 2014-10-21 ENCOUNTER — Telehealth: Payer: Self-pay | Admitting: Family Medicine

## 2014-10-21 NOTE — Telephone Encounter (Signed)
This is a FYI.

## 2014-10-24 ENCOUNTER — Ambulatory Visit (INDEPENDENT_AMBULATORY_CARE_PROVIDER_SITE_OTHER): Payer: Medicare Other | Admitting: *Deleted

## 2014-10-24 DIAGNOSIS — E538 Deficiency of other specified B group vitamins: Secondary | ICD-10-CM | POA: Diagnosis not present

## 2014-10-24 NOTE — Progress Notes (Signed)
Vitamin b12 injection given and tolerated well.  

## 2014-10-24 NOTE — Patient Instructions (Signed)

## 2014-10-28 ENCOUNTER — Other Ambulatory Visit: Payer: Self-pay | Admitting: Family Medicine

## 2014-11-04 ENCOUNTER — Ambulatory Visit (INDEPENDENT_AMBULATORY_CARE_PROVIDER_SITE_OTHER): Payer: Medicare Other | Admitting: Nurse Practitioner

## 2014-11-04 ENCOUNTER — Encounter: Payer: Self-pay | Admitting: Nurse Practitioner

## 2014-11-04 DIAGNOSIS — Z01419 Encounter for gynecological examination (general) (routine) without abnormal findings: Secondary | ICD-10-CM | POA: Diagnosis not present

## 2014-11-04 LAB — POCT UA - MICROSCOPIC ONLY
BACTERIA, U MICROSCOPIC: NEGATIVE
CASTS, UR, LPF, POC: NEGATIVE
Crystals, Ur, HPF, POC: NEGATIVE
Mucus, UA: NEGATIVE
WBC, Ur, HPF, POC: NEGATIVE
Yeast, UA: NEGATIVE

## 2014-11-04 LAB — POCT URINALYSIS DIPSTICK
Bilirubin, UA: NEGATIVE
Glucose, UA: NEGATIVE
KETONES UA: NEGATIVE
Leukocytes, UA: NEGATIVE
Nitrite, UA: NEGATIVE
PH UA: 6
PROTEIN UA: NEGATIVE
Spec Grav, UA: 1.02
Urobilinogen, UA: NEGATIVE

## 2014-11-04 MED ORDER — HYDROCORTISONE 2.5 % RE CREA: TOPICAL_CREAM | RECTAL | Status: DC

## 2014-11-04 NOTE — Progress Notes (Signed)
   Subjective:    Patient ID: [name], female    DOB: [birth year], 74 y.o.   MRN: [id number]  HPI [name] is a regular patien tof [name] that is here today for Pap and breast exam- Last saw [name] on 10/14/14. She  Is doing well today without complaints.    Review of Systems  Constitutional: Negative.   HENT: Negative.   Respiratory: Negative.   Cardiovascular: Negative.   Gastrointestinal: Negative.   Genitourinary: Negative.   Neurological: Negative.   Psychiatric/Behavioral: Negative.   All other systems reviewed and are negative.      Objective:   Physical Exam  Constitutional: She is oriented to person, place, and time. She appears well-developed and well-nourished.  HENT:  Head: Normocephalic.  Right Ear: Hearing, tympanic membrane, external ear and ear canal normal.  Left Ear: Hearing, tympanic membrane, external ear and ear canal normal.  Nose: Nose normal.  Mouth/Throat: Uvula is midline and oropharynx is clear and moist.  Eyes: Conjunctivae and EOM are normal. Pupils are equal, round, and reactive to light.  Neck: Normal range of motion and full passive range of motion without pain. Neck supple. No JVD present. Carotid bruit is not present. No thyroid mass and no thyromegaly present.  Cardiovascular: Normal rate, normal heart sounds and intact distal pulses.   No murmur heard. Pulmonary/Chest: Effort normal and breath sounds normal. Right breast exhibits no inverted nipple, no mass, no nipple discharge, no skin change and no tenderness. Left breast exhibits no inverted nipple, no mass, no nipple discharge, no skin change and no tenderness.  Abdominal: Soft. Bowel sounds are normal. She exhibits no mass. There is no tenderness.  Genitourinary: Vagina normal and uterus normal. No breast swelling, tenderness, discharge or bleeding.  bimanual exam-No adnexal masses or tenderness. Cervix parous and pink with stage 1 uterine prolapse   Musculoskeletal: Normal  range of motion.  Lymphadenopathy:    She has no cervical adenopathy.  Neurological: She is alert and oriented to person, place, and time.  Skin: Skin is warm and dry.  Psychiatric: She has a normal mood and affect. Her behavior is normal. Judgment and thought content normal.   BP 124/76 mmHg  Pulse 70  Temp(Src) 97.3 F (36.3 C) (Oral)  Ht 5\' 5"  (1.651 m)  Wt 182 lb (82.555 kg)  BMI 30.29 kg/m2        Assessment & Plan:   1. Encounter for routine gynecological examination    Orders Placed This Encounter  Procedures  . POCT UA - Microscopic Only  . POCT urinalysis dipstick   Meds ordered this encounter  Medications  . hydrocortisone (PROCTOZONE-HC) 2.5 % rectal cream    Sig: APPLY TO RECTALLY 2 TIMES A DAY AS NEEDED    Dispense:  30 g    Refill:  3    Order Specific Question:  Supervising Provider    Answer:  Chipper Herb [[id number]]   Keep follow up appointment with dr. Mayra Neer, FNP

## 2014-11-04 NOTE — Patient Instructions (Signed)
Hemorrhoids Hemorrhoids are puffy (swollen) veins around the rectum or anus. Hemorrhoids can cause pain, itching, bleeding, or irritation. HOME CARE  Eat foods with fiber, such as whole grains, beans, nuts, fruits, and vegetables. Ask your doctor about taking products with added fiber in them (fibersupplements).  Drink enough fluid to keep your pee (urine) clear or pale yellow.  Exercise often.  Go to the bathroom when you have the urge to poop. Do not wait.  Avoid straining to poop (bowel movement).  Keep the butt area dry and clean. Use wet toilet paper or moist paper towels.  Medicated creams and medicine inserted into the anus (anal suppository) may be used or applied as told.  Only take medicine as told by your doctor.  Take a warm water bath (sitz bath) for 15-20 minutes to ease pain. Do this 3-4 times a day.  Place ice packs on the area if it is tender or puffy. Use the ice packs between the warm water baths.  Put ice in a plastic bag.  Place a towel between your skin and the bag.  Leave the ice on for 15-20 minutes, 03-04 times a day.  Do not use a donut-shaped pillow or sit on the toilet for a long time. GET HELP RIGHT AWAY IF:   You have more pain that is not controlled by treatment or medicine.  You have bleeding that will not stop.  You have trouble or are unable to poop (bowel movement).  You have pain or puffiness outside the area of the hemorrhoids. MAKE SURE YOU:   Understand these instructions.  Will watch your condition.  Will get help right away if you are not doing well or get worse. Document Released: 03/22/2008 Document Revised: 05/30/2012 Document Reviewed: 04/24/2012 ExitCare Patient Information 2015 [employer]. This information is not intended to replace advice given to you by your health care provider. Make sure you discuss any questions you have with your health care provider.  

## 2014-11-06 LAB — PAP IG (IMAGE GUIDED): PAP Smear Comment: 0

## 2014-11-06 NOTE — Progress Notes (Signed)
lmtcb

## 2014-11-11 ENCOUNTER — Encounter: Payer: Self-pay | Admitting: Nurse Practitioner

## 2014-11-11 ENCOUNTER — Ambulatory Visit (INDEPENDENT_AMBULATORY_CARE_PROVIDER_SITE_OTHER): Payer: Medicare Other | Admitting: Nurse Practitioner

## 2014-11-11 VITALS — BP 127/75 | HR 76 | Temp 97.3°F | Ht 65.0 in | Wt 179.0 lb

## 2014-11-11 DIAGNOSIS — J069 Acute upper respiratory infection, unspecified: Secondary | ICD-10-CM

## 2014-11-11 MED ORDER — BENZONATATE 100 MG PO CAPS: 100.0000 mg | ORAL_CAPSULE | Freq: Three times a day (TID) | ORAL | Status: DC | PRN

## 2014-11-11 NOTE — Progress Notes (Signed)
   Subjective:    Patient ID: [name], female    DOB: 09-11-1940, 74 y.o.   MRN: [id number]  HPI Patient in tdoay c/o sore throat and cough that started yesterday- was wheezing last night- no fever.    Review of Systems  Constitutional: Negative for fever and chills.  HENT: Positive for congestion, rhinorrhea and sore throat. Negative for ear pain, trouble swallowing and voice change.   Respiratory: Positive for cough.   Cardiovascular: Negative.   Gastrointestinal: Negative.   Genitourinary: Negative.   Musculoskeletal: Negative.   Neurological: Negative.   Psychiatric/Behavioral: Negative.   All other systems reviewed and are negative.      Objective:   Physical Exam  Constitutional: She is oriented to person, place, and time. She appears well-developed and well-nourished. No distress.  HENT:  Right Ear: Hearing, tympanic membrane, external ear and ear canal normal.  Left Ear: Hearing, tympanic membrane, external ear and ear canal normal.  Nose: Mucosal edema and rhinorrhea present. Right sinus exhibits no maxillary sinus tenderness and no frontal sinus tenderness. Left sinus exhibits no maxillary sinus tenderness and no frontal sinus tenderness.  Mouth/Throat: Uvula is midline, oropharynx is clear and moist and mucous membranes are normal.  Neck: Normal range of motion. Neck supple.  Cardiovascular: Normal rate, regular rhythm and normal heart sounds.   Pulmonary/Chest: Effort normal and breath sounds normal.  Neurological: She is alert and oriented to person, place, and time.  Skin: Skin is warm and dry.  Psychiatric: She has a normal mood and affect. Her behavior is normal. Judgment and thought content normal.   BP 127/75 mmHg  Pulse 76  Temp(Src) 97.3 F (36.3 C) (Oral)  Ht 5\' 5"  (1.651 m)  Wt 179 lb (81.194 kg)  BMI 29.79 kg/m2        Assessment & Plan:  1. Upper respiratory infection with cough and congestion 1. Take meds as prescribed 2. Use a cool  mist humidifier especially during the winter months and when heat has been humid. 3. Use saline nose sprays frequently 4. Saline irrigations of the nose can be very helpful if done frequently.  * 4X daily for 1 week*  * Use of a nettie pot can be helpful with this. Follow directions with this* 5. Drink plenty of fluids 6. Keep thermostat turn down low 7.For any cough or congestion  Use plain Mucinex- regular strength or max strength is fine   * Children- consult with Pharmacist for dosing 8. For fever or aces or pains- take tylenol or ibuprofen appropriate for age and weight.  * for fevers greater than 101 orally you may alternate ibuprofen and tylenol every  3 hours.    - benzonatate (TESSALON PERLES) 100 MG capsule; Take 1 capsule (100 mg total) by mouth 3 (three) times daily as needed for cough.  Dispense: 20 capsule; Refill: 0  Mary-Margaret Hassell Done, FNP

## 2014-11-11 NOTE — Patient Instructions (Signed)

## 2014-11-14 ENCOUNTER — Other Ambulatory Visit: Payer: Self-pay | Admitting: Family Medicine

## 2014-11-28 ENCOUNTER — Ambulatory Visit (INDEPENDENT_AMBULATORY_CARE_PROVIDER_SITE_OTHER): Payer: Medicare Other | Admitting: *Deleted

## 2014-11-28 ENCOUNTER — Other Ambulatory Visit (INDEPENDENT_AMBULATORY_CARE_PROVIDER_SITE_OTHER): Payer: Medicare Other

## 2014-11-28 DIAGNOSIS — I10 Essential (primary) hypertension: Secondary | ICD-10-CM

## 2014-11-28 DIAGNOSIS — K219 Gastro-esophageal reflux disease without esophagitis: Secondary | ICD-10-CM | POA: Diagnosis not present

## 2014-11-28 DIAGNOSIS — E559 Vitamin D deficiency, unspecified: Secondary | ICD-10-CM

## 2014-11-28 DIAGNOSIS — E538 Deficiency of other specified B group vitamins: Secondary | ICD-10-CM | POA: Diagnosis not present

## 2014-11-28 DIAGNOSIS — E785 Hyperlipidemia, unspecified: Secondary | ICD-10-CM

## 2014-11-28 LAB — POCT CBC
Granulocyte percent: 54.8 %G (ref 37–80)
HCT, POC: 38.6 % (ref 37.7–47.9)
HEMOGLOBIN: 12 g/dL — AB (ref 12.2–16.2)
Lymph, poc: 1.9 (ref 0.6–3.4)
MCH, POC: 26.7 pg — AB (ref 27–31.2)
MCHC: 31 g/dL — AB (ref 31.8–35.4)
MCV: 85.9 fL (ref 80–97)
MPV: 8.8 fL (ref 0–99.8)
POC GRANULOCYTE: 3 (ref 2–6.9)
POC LYMPH PERCENT: 36 %L (ref 10–50)
Platelet Count, POC: 289 10*3/uL (ref 142–424)
RBC: 4.49 M/uL (ref 4.04–5.48)
RDW, POC: 13.8 %
WBC: 5.4 10*3/uL (ref 4.6–10.2)

## 2014-11-28 NOTE — Progress Notes (Signed)
Lab only 

## 2014-11-28 NOTE — Progress Notes (Signed)
Vitamin b12 injection given and tolerated well.  

## 2014-11-29 LAB — BMP8+EGFR
BUN/Creatinine Ratio: 16 (ref 11–26)
BUN: 10 mg/dL (ref 8–27)
CALCIUM: 9.2 mg/dL (ref 8.7–10.3)
CO2: 26 mmol/L (ref 18–29)
Chloride: 100 mmol/L (ref 97–108)
Creatinine, Ser: 0.63 mg/dL (ref 0.57–1.00)
GFR calc Af Amer: 104 mL/min/{1.73_m2} (ref >59)
GFR, EST NON AFRICAN AMERICAN: 90 mL/min/{1.73_m2} (ref >59)
Glucose: 112 mg/dL — ABNORMAL HIGH (ref 65–99)
Potassium: 4.3 mmol/L (ref 3.5–5.2)
Sodium: 142 mmol/L (ref 134–144)

## 2014-11-29 LAB — HEPATIC FUNCTION PANEL
ALBUMIN: 4.4 g/dL (ref 3.5–4.8)
ALT: 9 IU/L (ref 0–32)
AST: 21 IU/L (ref 0–40)
Alkaline Phosphatase: 105 IU/L (ref 39–117)
Bilirubin Total: 0.4 mg/dL (ref 0.0–1.2)
Bilirubin, Direct: 0.14 mg/dL (ref 0.00–0.40)
Total Protein: 6.3 g/dL (ref 6.0–8.5)

## 2014-11-29 LAB — NMR, LIPOPROFILE
Cholesterol: 180 mg/dL (ref 100–199)
HDL Cholesterol by NMR: 63 mg/dL (ref >39)
HDL Particle Number: 42.1 umol/L (ref >=30.5)
LDL Particle Number: 1378 nmol/L — ABNORMAL HIGH (ref <1000)
LDL SIZE: 20.9 nm (ref >20.5)
LDL-C: 101 mg/dL — ABNORMAL HIGH (ref 0–99)
LP-IR Score: 26 (ref <=45)
SMALL LDL PARTICLE NUMBER: 665 nmol/L — AB (ref <=527)
Triglycerides by NMR: 81 mg/dL (ref 0–149)

## 2014-11-29 LAB — VITAMIN B12: VITAMIN B 12: 567 pg/mL (ref 211–946)

## 2014-11-29 LAB — VITAMIN D 25 HYDROXY (VIT D DEFICIENCY, FRACTURES): Vit D, 25-Hydroxy: 58.1 ng/mL (ref 30.0–100.0)

## 2014-12-02 ENCOUNTER — Ambulatory Visit (INDEPENDENT_AMBULATORY_CARE_PROVIDER_SITE_OTHER): Payer: Medicare Other | Admitting: Family Medicine

## 2014-12-02 ENCOUNTER — Ambulatory Visit (INDEPENDENT_AMBULATORY_CARE_PROVIDER_SITE_OTHER): Payer: Medicare Other

## 2014-12-02 ENCOUNTER — Encounter: Payer: Self-pay | Admitting: Family Medicine

## 2014-12-02 VITALS — BP 123/68 | HR 74 | Temp 97.5°F | Ht 65.0 in | Wt 177.0 lb

## 2014-12-02 DIAGNOSIS — M25572 Pain in left ankle and joints of left foot: Secondary | ICD-10-CM

## 2014-12-02 DIAGNOSIS — I872 Venous insufficiency (chronic) (peripheral): Secondary | ICD-10-CM

## 2014-12-02 DIAGNOSIS — E785 Hyperlipidemia, unspecified: Secondary | ICD-10-CM

## 2014-12-02 DIAGNOSIS — E538 Deficiency of other specified B group vitamins: Secondary | ICD-10-CM

## 2014-12-02 DIAGNOSIS — K219 Gastro-esophageal reflux disease without esophagitis: Secondary | ICD-10-CM

## 2014-12-02 DIAGNOSIS — I10 Essential (primary) hypertension: Secondary | ICD-10-CM

## 2014-12-02 DIAGNOSIS — E559 Vitamin D deficiency, unspecified: Secondary | ICD-10-CM | POA: Diagnosis not present

## 2014-12-02 DIAGNOSIS — R5383 Other fatigue: Secondary | ICD-10-CM | POA: Diagnosis not present

## 2014-12-02 DIAGNOSIS — M79662 Pain in left lower leg: Secondary | ICD-10-CM

## 2014-12-02 MED ORDER — HYDROCORTISONE 2.5 % RE CREA: TOPICAL_CREAM | RECTAL | Status: DC

## 2014-12-02 NOTE — Progress Notes (Signed)
Subjective:    Patient ID: [name], female    DOB: March 30, 1941, 74 y.o.   MRN: [id number]  HPI Pt here for follow up and management of chronic medical problems which includes hypertension and hyperlipidemia. She is taking medications regularly. The patient has no complaints except some left lower extremity redness swelling and warmth.      Patient Active Problem List   Diagnosis Date Noted  . Osteoporosis with fracture 04/16/2014  . At high risk for falls 04/16/2014  . Proximal humerus fracture 05/30/2011  . CHEST PAIN, PRECORDIAL 02/26/2010  . HYPERLIPIDEMIA 02/23/2010  . DEPRESSION 02/23/2010  . GLAUCOMA 02/23/2010  . CATARACTS 02/23/2010  . RHEUMATIC FEVER 02/23/2010  . HYPERTENSION 02/23/2010  . MITRAL VALVE PROLAPSE 02/23/2010  . GERD 02/23/2010  . OSTEOARTHRITIS 02/23/2010  . FIBROMYALGIA 02/23/2010   Outpatient Encounter Prescriptions as of 12/02/2014  Medication Sig  . aspirin 81 MG tablet Take 1 tablet (81 mg total) by mouth every other day.  Marland Kitchen atenolol (TENORMIN) 50 MG tablet TAKE 1 TABLET DAILY  . benzonatate (TESSALON PERLES) 100 MG capsule Take 1 capsule (100 mg total) by mouth 3 (three) times daily as needed for cough.  . Cholecalciferol (VITAMIN D3) 1000 UNITS CAPS Take 1 capsule by mouth daily.    . clorazepate (TRANXENE) 7.5 MG tablet TAKE 1 TABLET DAILY AS NEEDED  . fluticasone (FLONASE) 50 MCG/ACT nasal spray Place 2 sprays into the nose daily.  . hydrocortisone (PROCTOZONE-HC) 2.5 % rectal cream APPLY TO RECTALLY 2 TIMES A DAY AS NEEDED  . ibuprofen (ADVIL,MOTRIN) 200 MG tablet Take 200 mg by mouth every 6 (six) hours as needed.  . Multiple Vitamins-Minerals (SENIOR MULTIVITAMIN PLUS PO) Take by mouth daily.    . Omeprazole Magnesium (PRILOSEC OTC PO) Take 1 capsule by mouth daily. For acid reflux  . simvastatin (ZOCOR) 80 MG tablet TAKE ONE TABLET AT BEDTIME  . TRAVATAN Z 0.004 % SOLN ophthalmic solution INSTILL 1 DROP INTO RIGHT EYE AT BEDTIME  .  venlafaxine XR (EFFEXOR-XR) 75 MG 24 hr capsule TAKE (1) CAPSULE DAILY  . Vitamin B1-B12 (NEURO B-12 IJ) Inject 1 vial as directed every 30 (thirty) days.   Marland Kitchen zolpidem (AMBIEN) 10 MG tablet TAKE 1/2 TABLET AT BEDTIME AS NEEDED FOR SLEEP  . [DISCONTINUED] hydrocortisone (PROCTOZONE-HC) 2.5 % rectal cream APPLY TO RECTALLY 2 TIMES A DAY AS NEEDED   Facility-Administered Encounter Medications as of 12/02/2014  Medication  . cyanocobalamin ((VITAMIN B-12)) injection 1,000 mcg      Review of Systems  Constitutional: Negative.   HENT: Negative.   Eyes: Negative.   Respiratory: Negative.   Cardiovascular: Negative.   Gastrointestinal: Negative.   Endocrine: Negative.   Genitourinary: Negative.   Musculoskeletal: Negative.   Skin: Negative.        Left lower ext. Swelling, redness and heat.   Allergic/Immunologic: Negative.   Neurological: Negative.   Hematological: Negative.   Psychiatric/Behavioral: Negative.        Objective:   Physical Exam  Constitutional: She is oriented to person, place, and time. She appears well-developed and well-nourished. No distress.  HENT:  Head: Normocephalic and atraumatic.  Left Ear: External ear normal.  Mouth/Throat: Oropharynx is clear and moist. No oropharyngeal exudate.  Minimal nasal congestion. There is ear cerumen and dead skin in the right ear canal  Eyes: Conjunctivae and EOM are normal. Pupils are equal, round, and reactive to light. Right eye exhibits no discharge. Left eye exhibits no discharge. No scleral icterus.  Neck: Normal range of motion. Neck supple. No thyromegaly present.  There are no bruits or anterior cervical adenopathy or thyromegaly  Cardiovascular: Normal rate, regular rhythm, normal heart sounds and intact distal pulses.   No murmur heard. The heart is regular at 60/m  Pulmonary/Chest: Effort normal and breath sounds normal. No respiratory distress. She has no wheezes. She has no rales. She exhibits no tenderness.    Clear anteriorly and posteriorly  Abdominal: Soft. Bowel sounds are normal. She exhibits no mass. There is no tenderness. There is no rebound and no guarding.  No abdominal tenderness or suprapubic tenderness or organ enlargement  Musculoskeletal: Normal range of motion. She exhibits no edema or tenderness.  There is some discomfort below the left lateral malleolus and above the heel on the left side.  Lymphadenopathy:    She has no cervical adenopathy.  Neurological: She is alert and oriented to person, place, and time. She has normal reflexes. No cranial nerve deficit.  Skin: Skin is warm and dry. Rash noted. There is erythema.  There is slight redness of the skin of the left lower extremity without rubor. There is also some discomfort below the left lateral malleolus.  Psychiatric: She has a normal mood and affect. Her behavior is normal. Judgment and thought content normal.  Nursing note and vitals reviewed.  BP 123/68 mmHg  Pulse 74  Temp(Src) 97.5 F (36.4 C) (Oral)  Ht 5\' 5"  (1.651 m)  Wt 177 lb (80.287 kg)  BMI 29.45 kg/m2  [facility] reading (PRIMARY) by  Dr. Louretta Parma heel and left ankle--no acute abnormality noted                                        Assessment & Plan:  1. Vitamin B 12 deficiency -The patient's B12 level was good and she should continue her B12 injections as she is doing  2. Hyperlipidemia -She should increase her simvastatin back to 40 mg and do better with diet and exercise  3. Essential hypertension -Continue current treatment and watch intake of sodium  4. Vitamin D deficiency -The vitamin D level was excellent and she should continue with current treatment  5. Gastroesophageal reflux disease, esophagitis presence not specified -Well and she should continue with her omeprazole  6. Other fatigue -She complains of fatigue and we will double check a thyroid profile on the blood work as ordered and drawn - Thyroid Panel With TSH  7. Left ankle  pain -This left ankle pain is actually below the lateral malleolus down closer to the heel area. It is worse in the morning when she gets up. - DG Ankle Complete Left; Future  8. Venous (peripheral) insufficiency -The left leg has some redness medially above the medial malleolus but no warmth or rubor. Because of the past history of injury to this area we will get another x-ray today. - DG Ankle Complete Left; Future  Meds ordered this encounter  Medications  . hydrocortisone (PROCTOZONE-HC) 2.5 % rectal cream    Sig: APPLY TO RECTALLY 2 TIMES A DAY AS NEEDED    Dispense:  30 g    Refill:  3   Patient Instructions                       Medicare Annual Wellness Visit  [facility] and the medical providers at [facility] strive to bring  you the best medical care.  In doing so we not only want to address your current medical conditions and concerns but also to detect new conditions early and prevent illness, disease and health-related problems.    Medicare offers a yearly Wellness Visit which allows our clinical staff to assess your need for preventative services including immunizations, lifestyle education, counseling to decrease risk of preventable diseases and screening for fall risk and other medical concerns.    This visit is provided free of charge (no copay) for all Medicare recipients. The clinical pharmacists at [facility] have begun to conduct these Wellness Visits which will also include a thorough review of all your medications.    As you primary medical provider recommend that you make an appointment for your Annual Wellness Visit if you have not done so already this year.  You may set up this appointment before you leave today or you may call back ([phone]) and schedule an appointment.  Please make sure when you call that you mention that you are scheduling your Annual Wellness Visit with the clinical pharmacist so that the  appointment may be made for the proper length of time.     Continue current medications. Continue good therapeutic lifestyle changes which include good diet and exercise. Fall precautions discussed with patient. If an FOBT was given today- please return it to our front desk. If you are over 44 years old - you may need Prevnar 23 or the adult Pneumonia vaccine.  Flu Shots are still available at our office. If you still haven't had one please call to set up a nurse visit to get one.   After your visit with [place] today you will receive a survey in the mail or online from [employer] regarding your care with [place]. Please take a moment to fill this out. Your feedback is very important to [place] as you can help [place] better understand your patient needs as well as improve your experience and satisfaction. WE CARE ABOUT YOU!!!   Wear support hose as directed and the should be put on the legs the first thing with arising in the morning If you need a new prescription for support hose please call us and we will give you want to take to the drugstore Watch sodium intake If on a trip, please get out of the car frequently walk around We will call you with the x-ray results as soon as they become available as well as the results of the thyroid profile   Arrie Senate MD

## 2014-12-02 NOTE — Patient Instructions (Addendum)
Medicare Annual Wellness Visit  [facility] and the medical providers at [facility] strive to bring you the best medical care.  In doing so we not only want to address your current medical conditions and concerns but also to detect new conditions early and prevent illness, disease and health-related problems.    Medicare offers a yearly Wellness Visit which allows our clinical staff to assess your need for preventative services including immunizations, lifestyle education, counseling to decrease risk of preventable diseases and screening for fall risk and other medical concerns.    This visit is provided free of charge (no copay) for all Medicare recipients. The clinical pharmacists at [facility] have begun to conduct these Wellness Visits which will also include a thorough review of all your medications.    As you primary medical provider recommend that you make an appointment for your Annual Wellness Visit if you have not done so already this year.  You may set up this appointment before you leave today or you may call back ([phone]) and schedule an appointment.  Please make sure when you call that you mention that you are scheduling your Annual Wellness Visit with the clinical pharmacist so that the appointment may be made for the proper length of time.     Continue current medications. Continue good therapeutic lifestyle changes which include good diet and exercise. Fall precautions discussed with patient. If an FOBT was given today- please return it to our front desk. If you are over 11 years old - you may need Prevnar 5 or the adult Pneumonia vaccine.  Flu Shots are still available at our office. If you still haven't had one please call to set up a nurse visit to get one.   After your visit with [place] today you will receive a survey in the mail or online from [employer] regarding your care with [place]. Please take a moment to  fill this out. Your feedback is very important to [place] as you can help [place] better understand your patient needs as well as improve your experience and satisfaction. WE CARE ABOUT YOU!!!   Wear support hose as directed and the should be put on the legs the first thing with arising in the morning If you need a new prescription for support hose please call us and we will give you want to take to the drugstore Watch sodium intake If on a trip, please get out of the car frequently walk around We will call you with the x-ray results as soon as they become available as well as the results of the thyroid profile

## 2014-12-02 NOTE — Addendum Note (Signed)
Addended by: Zannie Cove on: 12/02/2014 04:19 PM   Modules accepted: Orders

## 2014-12-03 ENCOUNTER — Telehealth: Payer: Self-pay | Admitting: *Deleted

## 2014-12-03 LAB — THYROID PANEL WITH TSH
Free Thyroxine Index: 2.3 (ref 1.2–4.9)
T3 UPTAKE RATIO: 23 % — AB (ref 24–39)
T4, Total: 10 ug/dL (ref 4.5–12.0)
TSH: 1.39 u[IU]/mL (ref 0.450–4.500)

## 2014-12-03 NOTE — Telephone Encounter (Signed)
-----   Message from Chipper Herb, MD sent at 12/03/2014  7:41 AM EDT ----- thyroid function tests are within normal limits------ no treatment is necessary Please confirm with the patient the dose of her simvastatin  that she was taking one half of, before she increases it to 1 whole pill daily

## 2014-12-09 ENCOUNTER — Other Ambulatory Visit: Payer: Self-pay

## 2014-12-09 ENCOUNTER — Telehealth: Payer: Self-pay | Admitting: Family Medicine

## 2014-12-09 DIAGNOSIS — M79661 Pain in right lower leg: Secondary | ICD-10-CM

## 2014-12-09 DIAGNOSIS — I872 Venous insufficiency (chronic) (peripheral): Secondary | ICD-10-CM

## 2014-12-09 DIAGNOSIS — M79662 Pain in left lower leg: Secondary | ICD-10-CM

## 2014-12-09 DIAGNOSIS — M25572 Pain in left ankle and joints of left foot: Secondary | ICD-10-CM

## 2014-12-09 NOTE — Progress Notes (Signed)
lmtcb

## 2014-12-09 NOTE — Progress Notes (Signed)
Patient aware and she was taking 1/2 tablet of simvastatin but now takes 1 tablet.

## 2014-12-30 ENCOUNTER — Ambulatory Visit (INDEPENDENT_AMBULATORY_CARE_PROVIDER_SITE_OTHER): Payer: Medicare Other | Admitting: *Deleted

## 2014-12-30 DIAGNOSIS — E538 Deficiency of other specified B group vitamins: Secondary | ICD-10-CM | POA: Diagnosis not present

## 2015-01-07 ENCOUNTER — Other Ambulatory Visit: Payer: Self-pay | Admitting: Family

## 2015-01-08 NOTE — Telephone Encounter (Signed)
Last seen 12/02/14 DW[facility]  If approved route to nurse to call into [facility]

## 2015-01-27 ENCOUNTER — Other Ambulatory Visit: Payer: Self-pay | Admitting: Family Medicine

## 2015-01-30 ENCOUNTER — Ambulatory Visit (INDEPENDENT_AMBULATORY_CARE_PROVIDER_SITE_OTHER): Payer: Medicare Other | Admitting: *Deleted

## 2015-01-30 DIAGNOSIS — E538 Deficiency of other specified B group vitamins: Secondary | ICD-10-CM

## 2015-01-30 NOTE — Patient Instructions (Signed)

## 2015-01-30 NOTE — Progress Notes (Signed)
Patient tolerated injection well.

## 2015-02-03 ENCOUNTER — Other Ambulatory Visit: Payer: Self-pay | Admitting: Family Medicine

## 2015-02-04 ENCOUNTER — Other Ambulatory Visit: Payer: Medicare Other

## 2015-02-04 DIAGNOSIS — Z1212 Encounter for screening for malignant neoplasm of rectum: Secondary | ICD-10-CM

## 2015-02-06 LAB — FECAL OCCULT BLOOD, IMMUNOCHEMICAL: Fecal Occult Bld: NEGATIVE

## 2015-02-13 ENCOUNTER — Other Ambulatory Visit: Payer: Self-pay | Admitting: Family Medicine

## 2015-02-20 ENCOUNTER — Ambulatory Visit (INDEPENDENT_AMBULATORY_CARE_PROVIDER_SITE_OTHER): Payer: Medicare Other | Admitting: Family Medicine

## 2015-02-20 ENCOUNTER — Encounter: Payer: Self-pay | Admitting: Family Medicine

## 2015-02-20 VITALS — BP 134/74 | HR 60 | Temp 97.2°F | Ht 65.0 in | Wt 178.0 lb

## 2015-02-20 DIAGNOSIS — W57XXXA Bitten or stung by nonvenomous insect and other nonvenomous arthropods, initial encounter: Secondary | ICD-10-CM | POA: Diagnosis not present

## 2015-02-20 DIAGNOSIS — S40921A Unspecified superficial injury of right upper arm, initial encounter: Secondary | ICD-10-CM

## 2015-02-20 DIAGNOSIS — D692 Other nonthrombocytopenic purpura: Secondary | ICD-10-CM

## 2015-02-20 MED ORDER — DOXYCYCLINE HYCLATE 100 MG PO TABS: 100.0000 mg | ORAL_TABLET | Freq: Two times a day (BID) | ORAL | Status: DC

## 2015-02-20 NOTE — Progress Notes (Signed)
Subjective:    Patient ID: [name], female    DOB: Dec 13, 1940, 74 y.o.   MRN: [id number]  HPI Patient here today for a tick bite to her upper right arm and to discuss her bruising. The patient has had increased bruising for several months. She does take a baby aspirin once a day. There did tick was removed from her right upper arm she has a large bruise where this was located also. This was removed 3 days ago.       Patient Active Problem List   Diagnosis Date Noted  . Osteoporosis with fracture 04/16/2014  . At high risk for falls 04/16/2014  . Proximal humerus fracture 05/30/2011  . CHEST PAIN, PRECORDIAL 02/26/2010  . HYPERLIPIDEMIA 02/23/2010  . DEPRESSION 02/23/2010  . GLAUCOMA 02/23/2010  . CATARACTS 02/23/2010  . RHEUMATIC FEVER 02/23/2010  . HYPERTENSION 02/23/2010  . MITRAL VALVE PROLAPSE 02/23/2010  . GERD 02/23/2010  . OSTEOARTHRITIS 02/23/2010  . FIBROMYALGIA 02/23/2010   Outpatient Encounter Prescriptions as of 02/20/2015  Medication Sig  . aspirin 81 MG tablet Take 1 tablet (81 mg total) by mouth every other day.  Marland Kitchen atenolol (TENORMIN) 50 MG tablet TAKE 1 TABLET DAILY  . Cholecalciferol (VITAMIN D3) 1000 UNITS CAPS Take 1 capsule by mouth daily.    . clorazepate (TRANXENE) 7.5 MG tablet TAKE 1 TABLET DAILY AS NEEDED  . fluticasone (FLONASE) 50 MCG/ACT nasal spray Place 2 sprays into the nose daily.  . hydrocortisone (PROCTOZONE-HC) 2.5 % rectal cream APPLY TO RECTALLY 2 TIMES A DAY AS NEEDED  . ibuprofen (ADVIL,MOTRIN) 200 MG tablet Take 200 mg by mouth every 6 (six) hours as needed.  . Multiple Vitamins-Minerals (SENIOR MULTIVITAMIN PLUS PO) Take by mouth daily.    . Omeprazole Magnesium (PRILOSEC OTC PO) Take 1 capsule by mouth daily. For acid reflux  . simvastatin (ZOCOR) 80 MG tablet TAKE ONE TABLET AT BEDTIME  . TRAVATAN Z 0.004 % SOLN ophthalmic solution INSTILL 1 DROP INTO RIGHT EYE AT BEDTIME  . venlafaxine XR (EFFEXOR-XR) 75 MG 24 hr capsule TAKE  (1) CAPSULE DAILY  . Vitamin B1-B12 (NEURO B-12 IJ) Inject 1 vial as directed every 30 (thirty) days.   Marland Kitchen zolpidem (AMBIEN) 10 MG tablet TAKE 1/2 TABLET AT BEDTIME AS NEEDED  . [DISCONTINUED] benzonatate (TESSALON PERLES) 100 MG capsule Take 1 capsule (100 mg total) by mouth 3 (three) times daily as needed for cough.   Facility-Administered Encounter Medications as of 02/20/2015  Medication  . cyanocobalamin ((VITAMIN B-12)) injection 1,000 mcg      Review of Systems  Constitutional: Negative.   HENT: Negative.   Eyes: Negative.   Respiratory: Negative.   Cardiovascular: Negative.   Gastrointestinal: Negative.   Endocrine: Negative.   Genitourinary: Negative.   Musculoskeletal: Negative.   Skin: Negative.        Multiple bruising Tick bite to right upper arm  Allergic/Immunologic: Negative.   Neurological: Negative.   Hematological: Negative.   Psychiatric/Behavioral: Negative.        Objective:   Physical Exam  Constitutional: She is oriented to person, place, and time. She appears well-developed and well-nourished. No distress.  HENT:  Head: Normocephalic.  Eyes: Conjunctivae and EOM are normal. Pupils are equal, round, and reactive to light. Right eye exhibits no discharge. Left eye exhibits no discharge. No scleral icterus.  Neck: Normal range of motion.  Musculoskeletal: Normal range of motion.  Neurological: She is alert and oriented to person, place, and time.  Skin:  Skin is warm and dry. Rash noted. No erythema.  Multiple bruises over lots of parts of the body. There is bruising on the lower legs arms and especially where the recent tick bite was.  Psychiatric: She has a normal mood and affect. Her behavior is normal. Judgment and thought content normal.  Nursing note and vitals reviewed.  BP 134/74 mmHg  Pulse 60  Temp(Src) 97.2 F (36.2 C) (Oral)  Ht 5\' 5"  (1.651 m)  Wt 178 lb (80.74 kg)  BMI 29.62 kg/m2        Assessment & Plan:  1. Tick bite -Take  doxycycline twice daily with food as directed - Lyme Ab/Western Blot Reflex - Rocky mtn spotted fvr abs pnl(IgG+IgM) - CBC with Differential/Platelet  2. Senile purpura -Discontinue baby aspirin - CBC with Differential/Platelet  Meds ordered this encounter  Medications  . doxycycline (VIBRA-TABS) 100 MG tablet    Sig: Take 1 tablet (100 mg total) by mouth 2 (two) times daily.    Dispense:  28 tablet    Refill:  0   Patient Instructions  Lyme Disease You may have been bitten by a tick and are to watch for the development of Lyme Disease. Lyme Disease is an infection that is caused by a bacteria The bacteria causing this disease is named Borreilia burgdorferi. If a tick is infected with this bacteria and then bites you, then Lyme Disease may occur. These ticks are carried by deer and rodents such as rabbits and mice and infest grassy as well as forested areas. Fortunately most tick bites do not cause Lyme Disease.  Lyme Disease is easier to prevent than to treat. First, covering your legs with clothing when walking in areas where ticks are possibly abundant will prevent their attachment because ticks tend to stay within inches of the ground. Second, using insecticides containing DEET can be applied on skin or clothing. Last, because it takes about 12 to 24 hours for the tick to transmit the disease after attachment to the human host, you should inspect your body for ticks twice a day when you are in areas where Lyme Disease is common. You must look thoroughly when searching for ticks. The Ixodes tick that carries Lyme Disease is very small. It is around the size of a sesame seed (picture of tick is not actual size). Removal is best done by grasping the tick by the head and pulling it out. Do not to squeeze the body of the tick. This could inject the infecting bacteria into the bite site. Wash the area of the bite with an antiseptic solution after removal.  Lyme Disease is a disease that may affect  many body systems. Because of the small size of the biting tick, most people do not notice being bitten. The first sign of an infection is usually a round red rash that extends out from the center of the tick bite. The center of the lesion may be blood colored (hemorrhagic) or have tiny blisters (vesicular). Most lesions have bright red outer borders and partial central clearing. This rash may extend out many inches in diameter, and multiple lesions may be present. Other symptoms such as fatigue, headaches, chills and fever, general achiness and swelling of lymph glands may also occur. If this first stage of the disease is left untreated, these symptoms may gradually resolve by themselves, or progressive symptoms may occur because of spread of infection to other areas of the body.  Follow up with your caregiver to have testing  and treatment if you have a tick bite and you develop any of the above complaints. Your caregiver may recommend preventative (prophylactic) medications which kill bacteria (antibiotics). Once a diagnosis of Lyme Disease is made, antibiotic treatment is highly likely to cure the disease. Effective treatment of late stage Lyme Disease may require longer courses of antibiotic therapy.  MAKE SURE YOU:   Understand these instructions.  Will watch your condition.  Will get help right away if you are not doing well or get worse. Document Released: 09/19/2000 Document Revised: 09/05/2011 Document Reviewed: 11/21/2008 [facility] Patient Information 2015 [place], [place]. This information is not intended to replace advice given to you by your health care provider. Make sure you discuss any questions you have with your health care provider.   Discontinue the baby aspirin Take antibiotic as directed twice daily with food We will call you with the results of the tick bite blood test as soon as they become available Use protection when outside and in the woods to prevent tick bites like--- deep  woods off   Arrie Senate MD

## 2015-02-20 NOTE — Patient Instructions (Addendum)
Lyme Disease You may have been bitten by a tick and are to watch for the development of Lyme Disease. Lyme Disease is an infection that is caused by a bacteria The bacteria causing this disease is named Borreilia burgdorferi. If a tick is infected with this bacteria and then bites you, then Lyme Disease may occur. These ticks are carried by deer and rodents such as rabbits and mice and infest grassy as well as forested areas. Fortunately most tick bites do not cause Lyme Disease.  Lyme Disease is easier to prevent than to treat. First, covering your legs with clothing when walking in areas where ticks are possibly abundant will prevent their attachment because ticks tend to stay within inches of the ground. Second, using insecticides containing DEET can be applied on skin or clothing. Last, because it takes about 12 to 24 hours for the tick to transmit the disease after attachment to the human host, you should inspect your body for ticks twice a day when you are in areas where Lyme Disease is common. You must look thoroughly when searching for ticks. The Ixodes tick that carries Lyme Disease is very small. It is around the size of a sesame seed (picture of tick is not actual size). Removal is best done by grasping the tick by the head and pulling it out. Do not to squeeze the body of the tick. This could inject the infecting bacteria into the bite site. Wash the area of the bite with an antiseptic solution after removal.  Lyme Disease is a disease that may affect many body systems. Because of the small size of the biting tick, most people do not notice being bitten. The first sign of an infection is usually a round red rash that extends out from the center of the tick bite. The center of the lesion may be blood colored (hemorrhagic) or have tiny blisters (vesicular). Most lesions have bright red outer borders and partial central clearing. This rash may extend out many inches in diameter, and multiple lesions may  be present. Other symptoms such as fatigue, headaches, chills and fever, general achiness and swelling of lymph glands may also occur. If this first stage of the disease is left untreated, these symptoms may gradually resolve by themselves, or progressive symptoms may occur because of spread of infection to other areas of the body.  Follow up with your caregiver to have testing and treatment if you have a tick bite and you develop any of the above complaints. Your caregiver may recommend preventative (prophylactic) medications which kill bacteria (antibiotics). Once a diagnosis of Lyme Disease is made, antibiotic treatment is highly likely to cure the disease. Effective treatment of late stage Lyme Disease may require longer courses of antibiotic therapy.  MAKE SURE YOU:   Understand these instructions.  Will watch your condition.  Will get help right away if you are not doing well or get worse. Document Released: 09/19/2000 Document Revised: 09/05/2011 Document Reviewed: 11/21/2008 [facility] Patient Information 2015 [place], [place]. This information is not intended to replace advice given to you by your health care provider. Make sure you discuss any questions you have with your health care provider.   Discontinue the baby aspirin Take antibiotic as directed twice daily with food We will call you with the results of the tick bite blood test as soon as they become available Use protection when outside and in the woods to prevent tick bites like--- deep woods off

## 2015-02-24 LAB — CBC WITH DIFFERENTIAL/PLATELET
Basophils Absolute: 0 10*3/uL (ref 0.0–0.2)
Basos: 0 %
EOS (ABSOLUTE): 0.1 10*3/uL (ref 0.0–0.4)
Eos: 3 %
Hematocrit: 36.3 % (ref 34.0–46.6)
Hemoglobin: 11.7 g/dL (ref 11.1–15.9)
IMMATURE GRANS (ABS): 0 10*3/uL (ref 0.0–0.1)
Immature Granulocytes: 0 %
Lymphocytes Absolute: 1.8 10*3/uL (ref 0.7–3.1)
Lymphs: 38 %
MCH: 27.3 pg (ref 26.6–33.0)
MCHC: 32.2 g/dL (ref 31.5–35.7)
MCV: 85 fL (ref 79–97)
Monocytes Absolute: 0.4 10*3/uL (ref 0.1–0.9)
Monocytes: 8 %
NEUTROS ABS: 2.4 10*3/uL (ref 1.4–7.0)
Neutrophils: 51 %
PLATELETS: 251 10*3/uL (ref 150–379)
RBC: 4.29 x10E6/uL (ref 3.77–5.28)
RDW: 15.5 % — ABNORMAL HIGH (ref 12.3–15.4)
WBC: 4.7 10*3/uL (ref 3.4–10.8)

## 2015-02-24 LAB — ROCKY MTN SPOTTED FVR ABS PNL(IGG+IGM)
RMSF IGG: NEGATIVE
RMSF IgM: 2.05 index — ABNORMAL HIGH (ref 0.00–0.89)

## 2015-02-24 LAB — LYME AB/WESTERN BLOT REFLEX: Lyme IgG/IgM Ab: <0.91 {ISR} (ref 0.00–0.90)

## 2015-03-03 ENCOUNTER — Ambulatory Visit (INDEPENDENT_AMBULATORY_CARE_PROVIDER_SITE_OTHER): Payer: Medicare Other | Admitting: *Deleted

## 2015-03-03 DIAGNOSIS — E538 Deficiency of other specified B group vitamins: Secondary | ICD-10-CM

## 2015-03-03 NOTE — Progress Notes (Signed)
Vitamin b12 injection given and tolerated well.  

## 2015-03-03 NOTE — Patient Instructions (Signed)
Vitamin B12 Injections Every person needs vitamin B12. A deficiency develops when the body does not get enough of it. One way to overcome this is by getting B12 shots (injections). A B12 shot puts the vitamin directly into muscle tissue. This avoids any problems your body might have in absorbing it from food or a pill. In some people, the body has trouble using the vitamin correctly. This can cause a B12 deficiency. Not consuming enough of the vitamin can also cause a deficiency. Getting enough vitamin B12 can be hard for elderly people. Sometimes, they do not eat a well-balanced diet. The elderly are also more likely than younger people to have medical conditions or take medications that can lead to a deficiency. WHAT DOES VITAMIN B12 DO? Vitamin B12 does many things to help the body work right:  It helps the body make healthy red blood cells.  It helps maintain nerve cells.  It is involved in the body's process of converting food into energy (metabolism).  It is needed to make the genetic material in all cells (DNA). VITAMIN B12 FOOD SOURCES Most people get plenty of vitamin B12 through the foods they eat. It is present in:  Meat, fish, poultry, and eggs.  Milk and milk products.  It also is added when certain foods are made, including some breads, cereals and yogurts. The food is then called "fortified". CAUSES The most common causes of vitamin B12 deficiency are:  Pernicious anemia. The condition develops when the body cannot make enough healthy red blood cells. This stems from a lack of a protein made in the stomach (intrinsic factor). People without this protein cannot absorb enough vitamin B12 from food.  Malabsorption. This is when the body cannot absorb the vitamin. It can be caused by:  Pernicious anemia.  Surgery to remove part or all of the stomach can lead to malabsorption. Removal of part or all of the small intestine can also cause malabsorption.  Vegetarian diet.  People who are strict about not eating foods from animals could have trouble taking in enough vitamin B12 from diet alone.  Medications. Some medicines have been linked to B12 deficiency, such as Metformin (a drug prescribed for type 2 diabetes). Long-term use of stomach acid suppressants also can keep the vitamin from being absorbed.  Intestinal problems such as inflammatory bowel disease. If there are problems in the digestive tract, vitamin B12 may not be absorbed in good enough amounts. SYMPTOMS People who do not get enough B12 can develop problems. These can include:  Anemia. This is when the body has too few red blood cells. Red blood cells carry oxygen to the rest of the body. Without a healthy supply of red blood cells, people can feel:  Tired (fatigued).  Weak.  Severe anemia can cause:  Shortness of breath.  Dizziness.  Rapid heart rate.  Paleness.  Other Vitamin B12 deficiency symptoms include:  Diarrhea.  Numbness or tingling in the hands or feet.  Loss of appetite.  Confusion.  Sores on the tongue or in the mouth. LET YOUR CAREGIVER KNOW ABOUT:  Any allergies. It is very important to know if you are allergic or sensitive to cobalt. Vitamin B12 contains cobalt.  Any history of kidney disease.  All medications you are taking. Include prescription and over-the-counter medicines, herbs and creams.  Whether you are pregnant or breast-feeding.  If you have Leber's disease, a hereditary eye condition, vitamin B12 could make it worse. RISKS AND COMPLICATIONS Reactions to an injection are   usually temporary. They might include:  Pain at the injection site.  Redness, swelling or tenderness at the site.  Headache, dizziness or weakness.  Nausea, upset stomach or diarrhea.  Numbness or tingling.  Fever.  Joint pain.  Itching or rash. If a reaction does not go away in a short while, talk with your healthcare provider. A change in the way the shots are  given, or where they are given, might need to be made. BEFORE AN INJECTION To decide whether B12 injections are right for you, your healthcare provider will probably:  Ask about your medical history.  Ask questions about your diet.  Ask about symptoms such as:  Have you felt weak?  Do you feel unusually tired?  Do you get dizzy?  Order blood tests. These may include a test to:  Check the level of red cells in your blood.  Measure B12 levels.  Check for the presence of intrinsic factor. VITAMIN B12 INJECTIONS How often you will need a vitamin B12 injection will depend on how severe your deficiency is. This also will affect how long you will need to get them. People with pernicious anemia usually get injections for their entire life. Others might get them for a shorter period. For many people, injections are given daily or weekly for several weeks. Then, once B12 levels are normal, injections are given just once a month. If the cause of the deficiency can be fixed, the injections can be stopped. Talk with your healthcare provider about what you should expect. For an injection:  The injection site will be cleaned with an alcohol swab.  Your healthcare provider will insert a needle directly into a muscle. Most any muscle can be used. Most often, an arm muscle is used. A buttocks muscle can also be used. Many people say shots in that area are less painful.  A small adhesive bandage may be put over the injection site. It usually can be taken off in an hour or less. Injections can be given by your healthcare provider. In some cases, family members give them. Sometimes, people give them to themselves. Talk with your healthcare provider about what would be best for you. If someone other than your healthcare provider will be giving the shots, the person will need to be trained to give them correctly. HOME CARE INSTRUCTIONS   You can remove the adhesive bandage within an hour of getting a  shot.  You should be able to go about your normal activities right away.  Avoid drinking large amounts of alcohol while taking vitamin B12 shots. Alcohol can interfere with the body's use of the vitamin. SEEK MEDICAL CARE IF:   Pain, redness, swelling or tenderness at the injection site does not get better or gets worse.  Headache, dizziness or weakness does not go away.  You develop a fever of more than 100.5 F (38.1 C). SEEK IMMEDIATE MEDICAL CARE IF:   You have chest pain.  You develop shortness of breath.  You have muscle weakness that gets worse.  You develop numbness, weakness or tingling on one side or one area of the body.  You have symptoms of an allergic reaction, such as:  Hives.  Difficulty breathing.  Swelling of the lips, face, tongue or throat.  You develop a fever of more than 102.0 F (38.9 C). MAKE SURE YOU:   Understand these instructions.  Will watch your condition.  Will get help right away if you are not doing well or get worse. Document   Released: 09/09/2008 Document Revised: 09/05/2011 Document Reviewed: 09/09/2008 [employer] Patient Information 2015 [employer]. This information is not intended to replace advice given to you by your health care provider. Make sure you discuss any questions you have with your health care provider.  

## 2015-03-28 ENCOUNTER — Other Ambulatory Visit: Payer: Self-pay | Admitting: Family

## 2015-03-29 NOTE — Telephone Encounter (Signed)
Last filled 02/10/15, last seen 12/02/14. Call in at [facility]

## 2015-03-31 ENCOUNTER — Ambulatory Visit (INDEPENDENT_AMBULATORY_CARE_PROVIDER_SITE_OTHER): Payer: Medicare Other | Admitting: *Deleted

## 2015-03-31 DIAGNOSIS — Z23 Encounter for immunization: Secondary | ICD-10-CM | POA: Diagnosis not present

## 2015-04-03 ENCOUNTER — Ambulatory Visit (INDEPENDENT_AMBULATORY_CARE_PROVIDER_SITE_OTHER): Payer: Medicare Other | Admitting: *Deleted

## 2015-04-03 DIAGNOSIS — E538 Deficiency of other specified B group vitamins: Secondary | ICD-10-CM

## 2015-04-03 NOTE — Patient Instructions (Signed)

## 2015-04-03 NOTE — Progress Notes (Signed)
Pt given B12 injection IM right deltoid and tolerated well. 

## 2015-04-10 ENCOUNTER — Other Ambulatory Visit: Payer: Medicare Other

## 2015-04-10 DIAGNOSIS — E538 Deficiency of other specified B group vitamins: Secondary | ICD-10-CM

## 2015-04-10 DIAGNOSIS — I10 Essential (primary) hypertension: Secondary | ICD-10-CM

## 2015-04-10 DIAGNOSIS — E785 Hyperlipidemia, unspecified: Secondary | ICD-10-CM

## 2015-04-10 DIAGNOSIS — K219 Gastro-esophageal reflux disease without esophagitis: Secondary | ICD-10-CM

## 2015-04-10 DIAGNOSIS — E559 Vitamin D deficiency, unspecified: Secondary | ICD-10-CM

## 2015-04-14 ENCOUNTER — Ambulatory Visit (INDEPENDENT_AMBULATORY_CARE_PROVIDER_SITE_OTHER): Payer: Medicare Other

## 2015-04-14 ENCOUNTER — Ambulatory Visit (INDEPENDENT_AMBULATORY_CARE_PROVIDER_SITE_OTHER): Payer: Medicare Other | Admitting: Family Medicine

## 2015-04-14 ENCOUNTER — Encounter: Payer: Self-pay | Admitting: Family Medicine

## 2015-04-14 VITALS — BP 121/65 | HR 58 | Temp 97.3°F | Ht 65.0 in | Wt 181.0 lb

## 2015-04-14 DIAGNOSIS — I10 Essential (primary) hypertension: Secondary | ICD-10-CM | POA: Diagnosis not present

## 2015-04-14 DIAGNOSIS — K219 Gastro-esophageal reflux disease without esophagitis: Secondary | ICD-10-CM

## 2015-04-14 DIAGNOSIS — E785 Hyperlipidemia, unspecified: Secondary | ICD-10-CM

## 2015-04-14 DIAGNOSIS — R600 Localized edema: Secondary | ICD-10-CM | POA: Diagnosis not present

## 2015-04-14 DIAGNOSIS — E538 Deficiency of other specified B group vitamins: Secondary | ICD-10-CM

## 2015-04-14 DIAGNOSIS — E559 Vitamin D deficiency, unspecified: Secondary | ICD-10-CM | POA: Diagnosis not present

## 2015-04-14 DIAGNOSIS — R5382 Chronic fatigue, unspecified: Secondary | ICD-10-CM

## 2015-04-14 LAB — CBC WITH DIFFERENTIAL/PLATELET
Basophils Absolute: 0 10*3/uL (ref 0.0–0.2)
Basos: 1 %
EOS (ABSOLUTE): 0.1 10*3/uL (ref 0.0–0.4)
EOS: 3 %
HEMATOCRIT: 35.1 % (ref 34.0–46.6)
Hemoglobin: 11.4 g/dL (ref 11.1–15.9)
IMMATURE GRANULOCYTES: 0 %
Immature Grans (Abs): 0 10*3/uL (ref 0.0–0.1)
LYMPHS ABS: 2.1 10*3/uL (ref 0.7–3.1)
Lymphs: 43 %
MCH: 27.2 pg (ref 26.6–33.0)
MCHC: 32.5 g/dL (ref 31.5–35.7)
MCV: 84 fL (ref 79–97)
Monocytes Absolute: 0.4 10*3/uL (ref 0.1–0.9)
Monocytes: 9 %
NEUTROS ABS: 2.1 10*3/uL (ref 1.4–7.0)
NEUTROS PCT: 44 %
Platelets: 260 10*3/uL (ref 150–379)
RBC: 4.19 x10E6/uL (ref 3.77–5.28)
RDW: 14.4 % (ref 12.3–15.4)
WBC: 4.7 10*3/uL (ref 3.4–10.8)

## 2015-04-14 LAB — NMR, LIPOPROFILE
Cholesterol: 182 mg/dL (ref 100–199)
HDL Cholesterol by NMR: 70 mg/dL (ref >39)
HDL PARTICLE NUMBER: 43.8 umol/L (ref >=30.5)
LDL Particle Number: 1122 nmol/L — ABNORMAL HIGH (ref <1000)
LDL SIZE: 21.3 nm (ref >20.5)
LDL-C: 94 mg/dL (ref 0–99)
LP-IR Score: <25 (ref <=45)
SMALL LDL PARTICLE NUMBER: 430 nmol/L (ref <=527)
TRIGLYCERIDES BY NMR: 92 mg/dL (ref 0–149)

## 2015-04-14 LAB — HEPATIC FUNCTION PANEL
ALT: 10 IU/L (ref 0–32)
AST: 20 IU/L (ref 0–40)
Albumin: 4.1 g/dL (ref 3.5–4.8)
Alkaline Phosphatase: 93 IU/L (ref 39–117)
BILIRUBIN TOTAL: 0.5 mg/dL (ref 0.0–1.2)
BILIRUBIN, DIRECT: 0.16 mg/dL (ref 0.00–0.40)
Total Protein: 6.2 g/dL (ref 6.0–8.5)

## 2015-04-14 LAB — BMP8+EGFR
BUN/Creatinine Ratio: 18 (ref 11–26)
BUN: 11 mg/dL (ref 8–27)
CALCIUM: 9.3 mg/dL (ref 8.7–10.3)
CO2: 28 mmol/L (ref 18–29)
CREATININE: 0.61 mg/dL (ref 0.57–1.00)
Chloride: 103 mmol/L (ref 97–108)
GFR, EST AFRICAN AMERICAN: 105 mL/min/{1.73_m2} (ref >59)
GFR, EST NON AFRICAN AMERICAN: 91 mL/min/{1.73_m2} (ref >59)
GLUCOSE: 97 mg/dL (ref 65–99)
Potassium: 3.9 mmol/L (ref 3.5–5.2)
SODIUM: 144 mmol/L (ref 134–144)

## 2015-04-14 LAB — VITAMIN D 25 HYDROXY (VIT D DEFICIENCY, FRACTURES): Vit D, 25-Hydroxy: 49.8 ng/mL (ref 30.0–100.0)

## 2015-04-14 MED ORDER — VENLAFAXINE HCL ER 75 MG PO CP24: ORAL_CAPSULE | ORAL | Status: DC

## 2015-04-14 NOTE — Patient Instructions (Addendum)
Medicare Annual Wellness Visit  [facility] and the medical providers at [facility] strive to bring you the best medical care.  In doing so we not only want to address your current medical conditions and concerns but also to detect new conditions early and prevent illness, disease and health-related problems.    Medicare offers a yearly Wellness Visit which allows our clinical staff to assess your need for preventative services including immunizations, lifestyle education, counseling to decrease risk of preventable diseases and screening for fall risk and other medical concerns.    This visit is provided free of charge (no copay) for all Medicare recipients. The clinical pharmacists at [facility] have begun to conduct these Wellness Visits which will also include a thorough review of all your medications.    As you primary medical provider recommend that you make an appointment for your Annual Wellness Visit if you have not done so already this year.  You may set up this appointment before you leave today or you may call back ([phone]) and schedule an appointment.  Please make sure when you call that you mention that you are scheduling your Annual Wellness Visit with the clinical pharmacist so that the appointment may be made for the proper length of time.     Continue current medications. Continue good therapeutic lifestyle changes which include good diet and exercise. Fall precautions discussed with patient. If an FOBT was given today- please return it to our front desk. If you are over 55 years old - you may need Prevnar 75 or the adult Pneumonia vaccine.  **Flu shots are available--- please call and schedule a FLU-CLINIC appointment**  After your visit with [place] today you will receive a survey in the mail or online from [employer] regarding your care with [place]. Please take a moment to fill this out. Your feedback is very  important to [place] as you can help [place] better understand your patient needs as well as improve your experience and satisfaction. WE CARE ABOUT YOU!!!   Follow-up with orthopedist as planned Continue to wear support hose but these on the first thing with arising in the morning We will call you with your lab work results and chest x-ray results once those become available

## 2015-04-14 NOTE — Progress Notes (Signed)
Subjective:    Patient ID: [name], female    DOB: 1941-06-23, 74 y.o.   MRN: [id number]  HPI Pt here for follow up and management of chronic medical problems which includes hyperlipidemia, hypertension and GERD. She is taking medications regularly. The patient is still complaining of some swelling in her left foot and some fatigue. She is due to get a chest x-ray today. Her flu shot and Prevnar have been done. The patient denies chest pain or shortness of breath. She is swallowing her food without problems and not having any indigestion heartburn or reflux symptoms. She also is not having any blood in the stool or black tarry bowel movements. She is passing her water okay but feels sometimes that she is not passing it as much as she should. She is not having any burning pain or frequency. She does see the orthopedist regularly and says that her left knee has degenerative arthritis and that he is thinking about doing a knee replacement. She also has this history of injury to the left lower leg. Both of these problems could be contributing to some of the edema that she is having in her left foot.      Patient Active Problem List   Diagnosis Date Noted  . Osteoporosis with fracture 04/16/2014  . At high risk for falls 04/16/2014  . Proximal humerus fracture 05/30/2011  . CHEST PAIN, PRECORDIAL 02/26/2010  . HYPERLIPIDEMIA 02/23/2010  . DEPRESSION 02/23/2010  . GLAUCOMA 02/23/2010  . CATARACTS 02/23/2010  . RHEUMATIC FEVER 02/23/2010  . HYPERTENSION 02/23/2010  . MITRAL VALVE PROLAPSE 02/23/2010  . GERD 02/23/2010  . OSTEOARTHRITIS 02/23/2010  . FIBROMYALGIA 02/23/2010   Outpatient Encounter Prescriptions as of 04/14/2015  Medication Sig  . atenolol (TENORMIN) 50 MG tablet TAKE 1 TABLET DAILY  . bimatoprost (LUMIGAN) 0.01 % SOLN 1 drop at bedtime.  . Cholecalciferol (VITAMIN D3) 1000 UNITS CAPS Take 1 capsule by mouth daily.    . clorazepate (TRANXENE) 7.5 MG tablet TAKE 1  TABLET DAILY AS NEEDED  . fluticasone (FLONASE) 50 MCG/ACT nasal spray Place 2 sprays into the nose daily.  . hydrocortisone (PROCTOZONE-HC) 2.5 % rectal cream APPLY TO RECTALLY 2 TIMES A DAY AS NEEDED  . ibuprofen (ADVIL,MOTRIN) 200 MG tablet Take 200 mg by mouth every 6 (six) hours as needed.  . Multiple Vitamins-Minerals (SENIOR MULTIVITAMIN PLUS PO) Take by mouth daily.    . Omeprazole Magnesium (PRILOSEC OTC PO) Take 1 capsule by mouth daily. For acid reflux  . simvastatin (ZOCOR) 80 MG tablet TAKE ONE TABLET AT BEDTIME  . TRAVATAN Z 0.004 % SOLN ophthalmic solution INSTILL 1 DROP INTO RIGHT EYE AT BEDTIME  . venlafaxine XR (EFFEXOR-XR) 75 MG 24 hr capsule TAKE (1) CAPSULE DAILY  . Vitamin B1-B12 (NEURO B-12 IJ) Inject 1 vial as directed every 30 (thirty) days.   Marland Kitchen zolpidem (AMBIEN) 10 MG tablet TAKE 1/2 TABLET AT BEDTIME AS NEEDED  . aspirin 81 MG tablet Take 1 tablet (81 mg total) by mouth every other day. (Patient not taking: Reported on 04/14/2015)  . [DISCONTINUED] doxycycline (VIBRA-TABS) 100 MG tablet Take 1 tablet (100 mg total) by mouth 2 (two) times daily.   Facility-Administered Encounter Medications as of 04/14/2015  Medication  . cyanocobalamin ((VITAMIN B-12)) injection 1,000 mcg      Review of Systems  Constitutional: Negative.   HENT: Negative.   Eyes: Negative.   Respiratory: Negative.   Cardiovascular: Negative.   Gastrointestinal: Negative.   Endocrine: Negative.  Genitourinary: Negative.   Musculoskeletal: Negative.        Still having left foot swelling  Skin: Negative.   Allergic/Immunologic: Negative.   Neurological: Negative.   Hematological: Negative.   Psychiatric/Behavioral: Negative.        Objective:   Physical Exam  Constitutional: She is oriented to person, place, and time. She appears well-developed and well-nourished. No distress.  HENT:  Head: Normocephalic and atraumatic.  Right Ear: External ear normal.  Left Ear: External ear  normal.  Nose: Nose normal.  Mouth/Throat: Oropharynx is clear and moist.  Eyes: Conjunctivae and EOM are normal. Pupils are equal, round, and reactive to light. Right eye exhibits no discharge. Left eye exhibits no discharge. No scleral icterus.  Neck: Normal range of motion. Neck supple. No thyromegaly present.  Cardiovascular: Normal rate, regular rhythm, normal heart sounds and intact distal pulses.   No murmur heard. At 72/m  Pulmonary/Chest: Effort normal and breath sounds normal. No respiratory distress. She has no wheezes. She has no rales. She exhibits no tenderness.  Abdominal: Soft. Bowel sounds are normal. She exhibits no mass. There is tenderness. There is no rebound and no guarding.  Generalized abdominal tenderness mostly located in the upper abdomen from previous surgical procedure.  Musculoskeletal: Normal range of motion. She exhibits tenderness. She exhibits no edema.  Currently there is minimal edema in the left foot. There is tenderness with mobility of the left knee and swelling of the left knee compared to the right knee.  Lymphadenopathy:    She has no cervical adenopathy.  Neurological: She is alert and oriented to person, place, and time. She has normal reflexes. No cranial nerve deficit.  Skin: Skin is warm and dry. No rash noted.  Psychiatric: She has a normal mood and affect. Her behavior is normal. Judgment and thought content normal.  Nursing note and vitals reviewed.  BP 121/65 mmHg  Pulse 58  Temp(Src) 97.3 F (36.3 C) (Oral)  Ht 5\' 5"  (1.651 m)  Wt 181 lb (82.101 kg)  BMI 30.12 kg/m2  [facility] reading (PRIMARY) by  Dr. Brunilda Payor x-ray--   borderline cardiac enlargement                                    Assessment & Plan:  1. Hyperlipidemia -Continue current treatment pending results of lab work  2. Essential hypertension -Blood pressure is good today and she should continue with her atenolol. - DG Chest 2 View; Future  3. Gastroesophageal  reflux disease, esophagitis presence not specified -The reflux is under good control and she should continue with her Prilosec over-the-counter.  4. Vitamin D deficiency -Continue vitamin D replacement pending results of lab work  5. Vitamin B 12 deficiency -Continue B12 replacement pending results of lab work  6. Chronic fatigue -Check lab work for any other issues that could be playing a role with her chronic fatigue  7. Localized edema -Wear support hose and 50s on person with arising in the morning -Follow-up with orthopedist regarding end-stage arthritis with left knee  Meds ordered this encounter  Medications  . bimatoprost (LUMIGAN) 0.01 % SOLN    Sig: 1 drop at bedtime.  Marland Kitchen venlafaxine XR (EFFEXOR-XR) 75 MG 24 hr capsule    Sig: TAKE (1) CAPSULE DAILY    Dispense:  90 capsule    Refill:  1   Patient Instructions  Medicare Annual Wellness Visit  [facility] and the medical providers at [facility] strive to bring you the best medical care.  In doing so we not only want to address your current medical conditions and concerns but also to detect new conditions early and prevent illness, disease and health-related problems.    Medicare offers a yearly Wellness Visit which allows our clinical staff to assess your need for preventative services including immunizations, lifestyle education, counseling to decrease risk of preventable diseases and screening for fall risk and other medical concerns.    This visit is provided free of charge (no copay) for all Medicare recipients. The clinical pharmacists at [facility] have begun to conduct these Wellness Visits which will also include a thorough review of all your medications.    As you primary medical provider recommend that you make an appointment for your Annual Wellness Visit if you have not done so already this year.  You may set up this appointment before you leave  today or you may call back ([phone]) and schedule an appointment.  Please make sure when you call that you mention that you are scheduling your Annual Wellness Visit with the clinical pharmacist so that the appointment may be made for the proper length of time.     Continue current medications. Continue good therapeutic lifestyle changes which include good diet and exercise. Fall precautions discussed with patient. If an FOBT was given today- please return it to our front desk. If you are over 21 years old - you may need Prevnar 73 or the adult Pneumonia vaccine.  **Flu shots are available--- please call and schedule a FLU-CLINIC appointment**  After your visit with [place] today you will receive a survey in the mail or online from [employer] regarding your care with [place]. Please take a moment to fill this out. Your feedback is very important to [place] as you can help [place] better understand your patient needs as well as improve your experience and satisfaction. WE CARE ABOUT YOU!!!   Follow-up with orthopedist as planned Continue to wear support hose but these on the first thing with arising in the morning We will call you with your lab work results and chest x-ray results once those become available    Arrie Senate MD

## 2015-04-15 ENCOUNTER — Ambulatory Visit (HOSPITAL_COMMUNITY)
Admission: RE | Admit: 2015-04-15 | Discharge: 2015-04-15 | Disposition: A | Discharge status: Home / Self Care | Payer: Medicare Other | Source: Ambulatory Visit | Attending: Family Medicine | Admitting: Family Medicine

## 2015-04-15 ENCOUNTER — Ambulatory Visit (HOSPITAL_COMMUNITY): Payer: Medicare Other

## 2015-04-15 ENCOUNTER — Encounter (HOSPITAL_COMMUNITY): Payer: Self-pay

## 2015-04-15 ENCOUNTER — Other Ambulatory Visit: Payer: Self-pay | Admitting: Family Medicine

## 2015-04-15 DIAGNOSIS — E041 Nontoxic single thyroid nodule: Secondary | ICD-10-CM | POA: Diagnosis not present

## 2015-04-15 DIAGNOSIS — R911 Solitary pulmonary nodule: Secondary | ICD-10-CM | POA: Diagnosis present

## 2015-04-15 DIAGNOSIS — I712 Thoracic aortic aneurysm, without rupture: Secondary | ICD-10-CM | POA: Insufficient documentation

## 2015-04-15 DIAGNOSIS — I898 Other specified noninfective disorders of lymphatic vessels and lymph nodes: Secondary | ICD-10-CM | POA: Diagnosis not present

## 2015-04-15 MED ORDER — IOHEXOL 300 MG/ML  SOLN
75.0000 mL | Freq: Once | INTRAMUSCULAR | Status: AC | PRN
  Administered 2015-04-15: 75 mL via INTRAVENOUS

## 2015-04-16 ENCOUNTER — Ambulatory Visit: Payer: Medicare Other | Admitting: Family Medicine

## 2015-04-16 ENCOUNTER — Other Ambulatory Visit: Payer: Self-pay | Admitting: *Deleted

## 2015-04-16 DIAGNOSIS — E041 Nontoxic single thyroid nodule: Secondary | ICD-10-CM

## 2015-04-16 DIAGNOSIS — I7121 Aneurysm of the ascending aorta, without rupture: Secondary | ICD-10-CM

## 2015-04-16 DIAGNOSIS — I712 Thoracic aortic aneurysm, without rupture: Secondary | ICD-10-CM

## 2015-04-21 ENCOUNTER — Encounter: Payer: Self-pay | Admitting: Cardiothoracic Surgery

## 2015-04-21 ENCOUNTER — Ambulatory Visit (HOSPITAL_COMMUNITY)
Admission: RE | Admit: 2015-04-21 | Discharge: 2015-04-21 | Disposition: A | Discharge status: Home / Self Care | Payer: Medicare Other | Source: Ambulatory Visit | Attending: Family Medicine | Admitting: Family Medicine

## 2015-04-21 ENCOUNTER — Encounter (INDEPENDENT_AMBULATORY_CARE_PROVIDER_SITE_OTHER): Payer: Medicare Other | Admitting: Cardiothoracic Surgery

## 2015-04-21 VITALS — BP 116/73 | HR 68 | Resp 20 | Ht 65.0 in | Wt 180.0 lb

## 2015-04-21 DIAGNOSIS — E042 Nontoxic multinodular goiter: Secondary | ICD-10-CM | POA: Insufficient documentation

## 2015-04-21 DIAGNOSIS — E041 Nontoxic single thyroid nodule: Secondary | ICD-10-CM | POA: Diagnosis present

## 2015-04-22 ENCOUNTER — Telehealth: Payer: Self-pay | Admitting: Family Medicine

## 2015-04-22 DIAGNOSIS — E042 Nontoxic multinodular goiter: Secondary | ICD-10-CM

## 2015-04-22 NOTE — Telephone Encounter (Signed)
Stp and reviewed thyroid ultrasound, referral ordered. Pt voiced understanding.

## 2015-04-23 ENCOUNTER — Institutional Professional Consult (permissible substitution) (INDEPENDENT_AMBULATORY_CARE_PROVIDER_SITE_OTHER): Payer: Medicare Other | Admitting: Cardiothoracic Surgery

## 2015-04-23 ENCOUNTER — Encounter: Payer: Self-pay | Admitting: Cardiothoracic Surgery

## 2015-04-23 VITALS — BP 140/70 | HR 60 | Resp 16 | Ht 66.0 in | Wt 180.0 lb

## 2015-04-23 DIAGNOSIS — I712 Thoracic aortic aneurysm, without rupture, unspecified: Secondary | ICD-10-CM

## 2015-04-23 NOTE — Progress Notes (Signed)
PCP is Redge Gainer, MD Referring Provider is Chipper Herb, MD Patient examined CTA of the thoracic aorta personally reviewed  HPI: 74 year old Caucasian female nonsmoker with hypertension presents for evaluation of recently diagnosed 4.7 cm fusiform ascending thoracic aneurysm that is asymptomatic. Patient a chest x-ray with a depression of pulmonary nodule. A followup CT scan showed no pulmonary nodule but a 4.7 cm diameter fusiform ascending aneurysm. The descending thoracic aorta is of normal size. There's no family history of thoracic or abdominal aortic aneurysm disease. She has no symptoms of chest pain. She has controlled hypertension on a beta blocker and takes a statin and checks her blood pressure at home.  She denies exertional chest pain which does have occasional palpitations. She does have some dyspnea on exertion especially going up steps. She states she had rheumatic fever at age 15. She denies any history of murmur.review of her medical record indicates no previous echocardiogram.  Patient has had left shoulder surgery, right knee replacement, and umbilical hernia repair without anesthetic or surgical complications.  Past Medical History  Diagnosis Date  . Helicobacter pylori (H. pylori)   . Hiatal hernia   . Other and unspecified hyperlipidemia   . Fibromyalgia   . Osteoarthritis   . Hyperlipidemia   . Hypertension   . ACL tear     right  Dr. Gladstone Pih   . Glaucoma     (SE) Dr. Arnoldo Morale   . Arthritis   . Post-menopausal   . Depression   . B12 deficiency   . Tremor   . DUB (dysfunctional uterine bleeding) 10/96  . Insomnia   . Constipation   . ETD (eustachian tube dysfunction)   . MVP (mitral valve prolapse)   . Adenomatous polyp   . Osteopenia   . Cataracts, both eyes 10/2006  . Anxiety   . Glaucoma     bilaterally  . Osteoporosis     Past Surgical History  Procedure Laterality Date  . Right knee replacement  3/11    Dr. Gladstone Pih  . Rt. eye cataract   05/28/07  . Dilation and curettage of uterus  03/31/95    Dr. Ovid Curd   . Cholecystectomy  1979  . Ventral hernia repair  2/99  . Bunionectomy  08/1999    right - Dr. Irving Shows   . Orif humerus fracture  05/30/2011    Procedure: OPEN REDUCTION INTERNAL FIXATION (ORIF) PROXIMAL HUMERUS FRACTURE;  Surgeon: Augustin Schooling;  Location: [facility];  Service: Orthopedics;  Laterality: Left;  open reduction internal fixation of proximal humerus fracture    Family History  Problem Relation Age of Onset  . Cancer Mother     PANCREATIC   . Osteoporosis Mother   . Hip fracture Mother   . Heart attack Father 63    Died 68  . Heart disease Father   . Arthritis Father   . Hypertension Sister   . Cancer Sister     skin  . Hypertension Sister     Social History Social History  Substance Use Topics  . Smoking status: Never Smoker   . Smokeless tobacco: Never Used  . Alcohol Use: No    Current Outpatient Prescriptions  Medication Sig Dispense Refill  . aspirin 81 MG tablet Take 1 tablet (81 mg total) by mouth every other day. 30 tablet   . atenolol (TENORMIN) 50 MG tablet TAKE 1 TABLET DAILY 30 tablet 3  . bimatoprost (LUMIGAN) 0.01 % SOLN 1 drop at bedtime.    Marland Kitchen  Cholecalciferol (VITAMIN D3) 1000 UNITS CAPS Take 1 capsule by mouth daily.      . clorazepate (TRANXENE) 7.5 MG tablet TAKE 1 TABLET DAILY AS NEEDED 30 tablet 2  . fluticasone (FLONASE) 50 MCG/ACT nasal spray Place 2 sprays into the nose daily. 16 g 6  . hydrocortisone (PROCTOZONE-HC) 2.5 % rectal cream APPLY TO RECTALLY 2 TIMES A DAY AS NEEDED 30 g 3  . ibuprofen (ADVIL,MOTRIN) 200 MG tablet Take 200 mg by mouth every 6 (six) hours as needed.    . Multiple Vitamins-Minerals (SENIOR MULTIVITAMIN PLUS PO) Take by mouth daily.      . Omeprazole Magnesium (PRILOSEC OTC PO) Take 1 capsule by mouth daily. For acid reflux    . simvastatin (ZOCOR) 80 MG tablet TAKE ONE TABLET AT BEDTIME 30 tablet 3  . TRAVATAN Z 0.004 % SOLN ophthalmic solution  INSTILL 1 DROP INTO RIGHT EYE AT BEDTIME 2.5 mL 2  . venlafaxine XR (EFFEXOR-XR) 75 MG 24 hr capsule TAKE (1) CAPSULE DAILY 90 capsule 1  . Vitamin B1-B12 (NEURO B-12 IJ) Inject 1 vial as directed every 30 (thirty) days.     Marland Kitchen zolpidem (AMBIEN) 10 MG tablet TAKE 1/2 TABLET AT BEDTIME AS NEEDED 30 tablet 1   Current Facility-Administered Medications  Medication Dose Route Frequency Provider Last Rate Last Dose  . cyanocobalamin ((VITAMIN B-12)) injection 1,000 mcg  1,000 mcg Intramuscular Q30 days Chipper Herb, MD   1,000 mcg at 04/03/15 1015    Allergies  Allergen Reactions  . Alendronate Sodium Nausea Only  . Latex Other (See Comments)    RA to latex 1982   . Levofloxacin Nausea And Vomiting  . Lipitor [Atorvastatin Calcium] Other (See Comments)    myalgias  . Lyrica [Pregabalin] Other (See Comments)    Reaction unknown  . Meloxicam     Vaginal itching   . Sibutramine Hcl Monohydrate Other (See Comments)    Reaction unknown  . Actonel [Risedronate] Other (See Comments)    Bone pain and nausea     Review of Systems         Review of Systems :  [ y ] = yes, [  ] = no        General :  Weight gain [   ]    Weight loss  [   ]  Fatigue [  ]  Fever [  ]  Chills  [  ]                                Weakness  [  ]    Positive for fibromyalgia, diffuse       Cardiac :  Chest pain/ pressure [  ]  Resting SOB [  ] exertional SOB [ yes ]                        Orthopnea [  ]  Pedal edema  [ yes-history of venous insufficiency ]  Palpitations [ yes ] Syncope/presyncope [ ]                         Paroxysmal nocturnal dyspnea [  ]        Pulmonary : cough [  ]  wheezing [  ]  Hemoptysis [  ] Sputum [  ] Snoring [  ]  Pneumothorax [  ]  Sleep apnea [  ]       GI : Vomiting [  ]  Dysphagia [  ]  Melena  [  ]  Abdominal pain [  ] BRBPR [  ]              Heart burn [  ]  Constipation [  ] Diarrhea  [  ] Colonoscopy [  ]       GU : Hematuria [  ]  Dysuria [  ]   Nocturia [  ] UTI's [  ]       Vascular : Claudication [  ]  Rest pain [  ]  DVT [  ] Vein stripping [  ] leg ulcers [ yes-dramatic ]                          TIA [  ] Stroke [  ]  Varicose veins [ superficial varicosities left greater than right ]       NEURO :  Headaches  [  ] Seizures [  ] Vision changes [  ] Paresthesias [  ]       Musculoskeletal :  Arthritis [  ] Gout  [  ]  Back pain [  ]  Joint pain [place]  ]       Skin :  Rash [  ]  Melanoma [  ]        Heme : Bleeding problems [  ]Clotting Disorders [  ] Anemia [  ]Blood Transfusion [ ]   Positive for easy bruising in the extremities       Endocrine : Diabetes [no  ] Thyroid Disorder  [  ]       Psych : Depression [  ]  Anxiety [  ]  Psych hospitalizations [  ]         No history of thoracic trauma    The patient is left-hand dominant                                      BP 140/70 mmHg  Pulse 60  Resp 16  Ht 5\' 6"  (1.676 m)  Wt 180 lb (81.647 kg)  BMI 29.07 kg/m2  SpO2 97% Physical Exam       Physical Exam  General: 74 year old obese Caucasian female no acute distress alert and pleasant and well kept HEENT: Normocephalic pupils on equal from prior surgery , dentition adequate Neck: Supple without JVD, adenopathy, or bruit Chest: Clear to auscultation, symmetrical breath sounds, no rhonchi, no tenderness             or deformity Cardiovascular: Regular rate and rhythm, no murmur, positive S4 gallop, peripheral pulses             palpable in all extremities Abdomen:  Soft, nontender, no palpable mass or organomegaly Extremities: Warm, well-perfused, no clubbing cyanosis edema or tenderness,              Positive for venous stasis changes of the legs, left greater than right Rectal/GU: Deferred Neuro: Grossly non--focal and symmetrical throughout Skin: Clean and dry without rash or ulceration   Diagnostic Tests: CT of the thoracic aorta and thorax personal reviewed. She has mild COPD, emphysema. The ascending aorta  has a fusiform aneurysm with some irregularity but no  evidence of ulceration or hematoma. The greatestl diameter is 4.7 cm and at the innominate artery the aorta returns back to normal size.  Impression: Asymptomatic fusiform ascending aneurysm measuring 4.7 cm in diameter This represents very low risk for dissection at this size. Surgery is not recommended to replace the ascending aorta into maximal diameter reaches 5.5 cm. At present the risk for dissection from her aorta is less than 2%.  Patient understands the best therapy to prevent further progression in the size of her thoracic aorta is good blood pressure control, remain on Zocor, weight control, and regular exercise as tolerated. We will plan on  serial surveillance CT scans of the chest again in 6 months. There is no change we will put this very nice patient on 812 month surveillance scan schedule. She knows to let us know if she develops any significant sustained pain behind her sternum or between her shoulder blades.  Plan:return in 6 months with a CT scan scan of the chest-CTA. We'll also obtain echocardiogram to assess her LV function and aortic valve status.   Len Childs, MD [facility] [phone]

## 2015-04-29 ENCOUNTER — Telehealth: Payer: Self-pay | Admitting: Family Medicine

## 2015-04-29 NOTE — Telephone Encounter (Signed)
Stp and she is already aware of thyroid ultrasound results.

## 2015-05-05 ENCOUNTER — Ambulatory Visit (INDEPENDENT_AMBULATORY_CARE_PROVIDER_SITE_OTHER): Payer: Medicare Other

## 2015-05-05 DIAGNOSIS — E538 Deficiency of other specified B group vitamins: Secondary | ICD-10-CM

## 2015-05-06 ENCOUNTER — Other Ambulatory Visit (INDEPENDENT_AMBULATORY_CARE_PROVIDER_SITE_OTHER): Payer: Medicare Other

## 2015-05-06 DIAGNOSIS — R300 Dysuria: Secondary | ICD-10-CM

## 2015-05-06 LAB — POCT UA - MICROSCOPIC ONLY
Casts, Ur, LPF, POC: NEGATIVE
Crystals, Ur, HPF, POC: NEGATIVE
MUCUS UA: NEGATIVE
Yeast, UA: NEGATIVE

## 2015-05-06 LAB — POCT URINALYSIS DIPSTICK
Bilirubin, UA: NEGATIVE
Glucose, UA: NEGATIVE
KETONES UA: NEGATIVE
NITRITE UA: NEGATIVE
Spec Grav, UA: <=1.005
UROBILINOGEN UA: NEGATIVE
pH, UA: 5

## 2015-05-06 MED ORDER — SULFAMETHOXAZOLE-TRIMETHOPRIM 800-160 MG PO TABS: 1.0000 | ORAL_TABLET | Freq: Two times a day (BID) | ORAL | Status: DC

## 2015-05-06 NOTE — Addendum Note (Signed)
Addended by: Thana Ates on: 05/06/2015 03:29 PM   Modules accepted: Orders

## 2015-05-06 NOTE — Progress Notes (Signed)
Lab only 

## 2015-05-07 ENCOUNTER — Other Ambulatory Visit: Payer: Self-pay | Admitting: General Surgery

## 2015-05-07 DIAGNOSIS — E041 Nontoxic single thyroid nodule: Secondary | ICD-10-CM

## 2015-05-08 ENCOUNTER — Other Ambulatory Visit: Payer: Self-pay | Admitting: *Deleted

## 2015-05-08 LAB — URINE CULTURE

## 2015-05-08 MED ORDER — NITROFURANTOIN MACROCRYSTAL 100 MG PO CAPS: 100.0000 mg | ORAL_CAPSULE | Freq: Two times a day (BID) | ORAL | Status: DC

## 2015-05-13 ENCOUNTER — Ambulatory Visit
Admission: RE | Admit: 2015-05-13 | Discharge: 2015-05-13 | Disposition: A | Discharge status: Home / Self Care | Payer: Medicare Other | Source: Ambulatory Visit | Attending: General Surgery | Admitting: General Surgery

## 2015-05-13 ENCOUNTER — Other Ambulatory Visit (HOSPITAL_COMMUNITY)
Admission: RE | Admit: 2015-05-13 | Discharge: 2015-05-13 | Disposition: A | Discharge status: Home / Self Care | Payer: Medicare Other | Source: Ambulatory Visit | Attending: Radiology | Admitting: Radiology

## 2015-05-13 DIAGNOSIS — E041 Nontoxic single thyroid nodule: Secondary | ICD-10-CM

## 2015-05-15 ENCOUNTER — Other Ambulatory Visit: Payer: Self-pay | Admitting: Family Medicine

## 2015-05-18 NOTE — Telephone Encounter (Signed)
Last seen 120/18/16  DWM  If approved route to nurse to call into [facility]

## 2015-05-19 ENCOUNTER — Other Ambulatory Visit (INDEPENDENT_AMBULATORY_CARE_PROVIDER_SITE_OTHER): Payer: Medicare Other

## 2015-05-19 DIAGNOSIS — N390 Urinary tract infection, site not specified: Secondary | ICD-10-CM | POA: Diagnosis not present

## 2015-05-19 LAB — POCT URINALYSIS DIPSTICK
BILIRUBIN UA: NEGATIVE
GLUCOSE UA: NEGATIVE
Ketones, UA: NEGATIVE
Nitrite, UA: NEGATIVE
SPEC GRAV UA: 1.015
UROBILINOGEN UA: NEGATIVE
pH, UA: 6

## 2015-05-19 LAB — POCT UA - MICROSCOPIC ONLY
Bacteria, U Microscopic: NEGATIVE
CRYSTALS, UR, HPF, POC: NEGATIVE
Casts, Ur, LPF, POC: NEGATIVE
MUCUS UA: NEGATIVE
YEAST UA: NEGATIVE

## 2015-05-19 NOTE — Addendum Note (Signed)
Addended by: Earlene Plater on: 05/19/2015 12:41 PM   Modules accepted: Orders

## 2015-05-19 NOTE — Progress Notes (Signed)
Lab only 

## 2015-05-20 ENCOUNTER — Telehealth: Payer: Self-pay

## 2015-05-20 ENCOUNTER — Telehealth: Payer: Self-pay | Admitting: Family Medicine

## 2015-05-20 NOTE — Telephone Encounter (Signed)
Insurance approved prior authorization for Clorazepate

## 2015-05-21 LAB — URINE CULTURE

## 2015-05-22 MED ORDER — NITROFURANTOIN MACROCRYSTAL 100 MG PO CAPS: 100.0000 mg | ORAL_CAPSULE | Freq: Two times a day (BID) | ORAL | Status: DC

## 2015-05-22 NOTE — Progress Notes (Signed)
Patient aware. Please call in rx

## 2015-05-22 NOTE — Telephone Encounter (Signed)
Patient aware and rx sent to pharmacy.  

## 2015-05-27 ENCOUNTER — Other Ambulatory Visit: Payer: Self-pay | Admitting: Family Medicine

## 2015-06-03 ENCOUNTER — Other Ambulatory Visit: Payer: Medicare Other

## 2015-06-03 ENCOUNTER — Telehealth: Payer: Self-pay | Admitting: *Deleted

## 2015-06-03 DIAGNOSIS — R300 Dysuria: Secondary | ICD-10-CM

## 2015-06-03 MED ORDER — CELECOXIB 200 MG PO CAPS: 200.0000 mg | ORAL_CAPSULE | Freq: Every day | ORAL | Status: DC

## 2015-06-03 NOTE — Telephone Encounter (Signed)
Pt wants to know if she can restart celebrex  She states she has a lot of aches and pains.

## 2015-06-03 NOTE — Telephone Encounter (Signed)
Restart previous Celebrex dose and check BMP and LFTs in 4 weeks and make sure she takes it after eating and make sure that she does not take any other NSAIDs while she is taking this

## 2015-06-04 ENCOUNTER — Telehealth: Payer: Self-pay

## 2015-06-04 NOTE — Telephone Encounter (Signed)
Insurance prior authorized Celecoxib

## 2015-06-05 LAB — URINE CULTURE

## 2015-06-06 ENCOUNTER — Telehealth: Payer: Self-pay | Admitting: *Deleted

## 2015-06-06 NOTE — Telephone Encounter (Signed)
-----   Message from Chipper Herb, MD sent at 06/06/2015 10:17 AM EST ----- There is still growth of enterococcus. Please prescribe another week's worth of Macrobid 100 mg twice daily with food until completed and recheck urinalysis after completion

## 2015-06-08 ENCOUNTER — Ambulatory Visit (INDEPENDENT_AMBULATORY_CARE_PROVIDER_SITE_OTHER): Payer: Medicare Other | Admitting: *Deleted

## 2015-06-08 DIAGNOSIS — E538 Deficiency of other specified B group vitamins: Secondary | ICD-10-CM

## 2015-06-10 ENCOUNTER — Ambulatory Visit (INDEPENDENT_AMBULATORY_CARE_PROVIDER_SITE_OTHER): Payer: Medicare Other | Admitting: Family Medicine

## 2015-06-10 ENCOUNTER — Encounter: Payer: Self-pay | Admitting: Family Medicine

## 2015-06-10 VITALS — BP 124/65 | HR 60 | Temp 97.1°F | Ht 65.0 in | Wt 179.6 lb

## 2015-06-10 DIAGNOSIS — S0003XA Contusion of scalp, initial encounter: Secondary | ICD-10-CM | POA: Diagnosis not present

## 2015-06-10 MED ORDER — NITROFURANTOIN MACROCRYSTAL 100 MG PO CAPS: 100.0000 mg | ORAL_CAPSULE | Freq: Two times a day (BID) | ORAL | Status: DC

## 2015-06-10 NOTE — Progress Notes (Signed)
BP 124/65 mmHg  Pulse 60  Temp(Src) 97.1 F (36.2 C) (Oral)  Ht 5\' 5"  (1.651 m)  Wt 179 lb 9.6 oz (81.466 kg)  BMI 29.89 kg/m2   Subjective:    Patient ID: [name], female    DOB: March 16, 1941, 74 y.o.   MRN: [id number]  HPI: [name] is a 74 y.o. female presenting on 06/10/2015 for Knot on head   HPI   Relevant past medical, surgical, family and social history reviewed and updated as indicated. Interim medical history since our last visit reviewed. Allergies and medications reviewed and updated.  Review of Systems  Constitutional: Negative for fever and chills.  HENT: Negative for congestion, ear discharge and ear pain.   Eyes: Negative for redness and visual disturbance.  Respiratory: Negative for chest tightness and shortness of breath.   Cardiovascular: Negative for chest pain and leg swelling.  Genitourinary: Negative for dysuria and difficulty urinating.  Musculoskeletal: Negative for back pain and gait problem.  Skin: Positive for color change (bruise). Negative for rash.  Neurological: Positive for headaches. Negative for light-headedness.  Psychiatric/Behavioral: Negative for behavioral problems and agitation.  All other systems reviewed and are negative.   Per HPI unless specifically indicated above     Medication List       This list is accurate as of: 06/10/15  4:33 PM.  Always use your most recent med list.               aspirin 81 MG tablet  Take 1 tablet (81 mg total) by mouth every other day.     atenolol 50 MG tablet  Commonly known as:  TENORMIN  TAKE 1 TABLET DAILY     bimatoprost 0.01 % Soln  Commonly known as:  LUMIGAN  1 drop at bedtime.     celecoxib 200 MG capsule  Commonly known as:  CELEBREX  Take 1 capsule (200 mg total) by mouth daily.     clorazepate 7.5 MG tablet  Commonly known as:  TRANXENE  TAKE 1 TABLET DAILY AS NEEDED     fluticasone 50 MCG/ACT nasal spray  Commonly known as:  FLONASE  Place 2 sprays  into the nose daily.     hydrocortisone 2.5 % rectal cream  Commonly known as:  PROCTOZONE-HC  APPLY TO RECTALLY 2 TIMES A DAY AS NEEDED     ibuprofen 200 MG tablet  Commonly known as:  ADVIL,MOTRIN  Take 200 mg by mouth every 6 (six) hours as needed.     NEURO B-12 IJ  Inject 1 vial as directed every 30 (thirty) days.     nitrofurantoin 100 MG capsule  Commonly known as:  MACRODANTIN  Take 1 capsule (100 mg total) by mouth 2 (two) times daily.     PRILOSEC OTC PO  Take 1 capsule by mouth daily. For acid reflux     SENIOR MULTIVITAMIN PLUS PO  Take by mouth daily.     simvastatin 80 MG tablet  Commonly known as:  ZOCOR  TAKE ONE TABLET AT BEDTIME     TRAVATAN Z 0.004 % Soln ophthalmic solution  Generic drug:  Travoprost (BAK Free)  INSTILL 1 DROP INTO RIGHT EYE AT BEDTIME     venlafaxine XR 75 MG 24 hr capsule  Commonly known as:  EFFEXOR-XR  TAKE (1) CAPSULE DAILY     Vitamin D3 1000 UNITS Caps  Take 1 capsule by mouth daily.     zolpidem 10 MG tablet  Commonly  known as:  AMBIEN  TAKE 1/2 TABLET AT BEDTIME AS NEEDED           Objective:    BP 124/65 mmHg  Pulse 60  Temp(Src) 97.1 F (36.2 C) (Oral)  Ht 5\' 5"  (1.651 m)  Wt 179 lb 9.6 oz (81.466 kg)  BMI 29.89 kg/m2  Wt Readings from Last 3 Encounters:  06/10/15 179 lb 9.6 oz (81.466 kg)  04/23/15 180 lb (81.647 kg)  04/21/15 180 lb (81.647 kg)    Physical Exam  Constitutional: She is oriented to person, place, and time. She appears well-developed and well-nourished. No distress.  Eyes: Conjunctivae and EOM are normal. Pupils are equal, round, and reactive to light.  Cardiovascular: Normal rate, regular rhythm, normal heart sounds and intact distal pulses.   No murmur heard. Pulmonary/Chest: Effort normal and breath sounds normal. No respiratory distress. She has no wheezes.  Musculoskeletal: Normal range of motion. She exhibits no edema or tenderness.  Neurological: She is alert and oriented to  person, place, and time. She has normal strength and normal reflexes. No cranial nerve deficit or sensory deficit. She exhibits normal muscle tone. Coordination and gait normal.  Skin: Skin is warm and dry. Bruising (Small bruise and raised contusion on left upper occipital region) noted. No rash noted. She is not diaphoretic.  Psychiatric: She has a normal mood and affect. Her behavior is normal.  Nursing note and vitals reviewed.   Results for orders placed or performed in visit on 06/03/15  Urine culture  Result Value Ref Range   Urine Culture, Routine Final report (A)    Urine Culture result 1 Enterococcus species (A)    RESULT 2 Comment    ANTIMICROBIAL SUSCEPTIBILITY Comment       Assessment & Plan:   Problem List Items Addressed This Visit    None    Visit Diagnoses    Scalp contusion, initial encounter    -  Primary     A she had a scalp contusion afer hitting her head on a screw.  no abnormal neurological signs,  insructed to watch for any        Follow up plan: Return if symptoms worsen or fail to improve.  Counseling provided for all of the vaccine components No orders of the defined types were placed in this encounter.    Caryl Pina, MD [facility] 06/10/2015, 4:33 PM

## 2015-06-23 ENCOUNTER — Ambulatory Visit (INDEPENDENT_AMBULATORY_CARE_PROVIDER_SITE_OTHER): Payer: Medicare Other | Admitting: Family Medicine

## 2015-06-23 ENCOUNTER — Encounter: Payer: Self-pay | Admitting: Family Medicine

## 2015-06-23 VITALS — BP 118/69 | HR 73 | Temp 98.2°F | Ht 65.0 in | Wt 181.0 lb

## 2015-06-23 DIAGNOSIS — J329 Chronic sinusitis, unspecified: Secondary | ICD-10-CM | POA: Diagnosis not present

## 2015-06-23 DIAGNOSIS — J9801 Acute bronchospasm: Secondary | ICD-10-CM

## 2015-06-23 MED ORDER — AMOXICILLIN-POT CLAVULANATE 875-125 MG PO TABS: 1.0000 | ORAL_TABLET | Freq: Two times a day (BID) | ORAL | Status: DC

## 2015-06-23 NOTE — Patient Instructions (Signed)
Drink plenty of fluids and continue to stay well hydrated Take Mucinex maximum strength, blue and white in color, 1 twice daily with a large glass of water whether you are coughing are not Take antibiotic as directed twice daily with food Continue to use nasal saline frequently in each nostril

## 2015-06-23 NOTE — Progress Notes (Signed)
Subjective:    Patient ID: [name], female    DOB: [birth year], 74 y.o.   MRN: [id number]  HPI Patient here today for cough and congestion that started about 1 and 1/2 weeks ago. The patient has had sinus pressure with green drainage and congestion. She has had headaches cough and weakness.      Patient Active Problem List   Diagnosis Date Noted  . Osteoporosis with fracture 04/16/2014  . At high risk for falls 04/16/2014  . Proximal humerus fracture 05/30/2011  . CHEST PAIN, PRECORDIAL 02/26/2010  . HYPERLIPIDEMIA 02/23/2010  . DEPRESSION 02/23/2010  . GLAUCOMA 02/23/2010  . CATARACTS 02/23/2010  . RHEUMATIC FEVER 02/23/2010  . HYPERTENSION 02/23/2010  . MITRAL VALVE PROLAPSE 02/23/2010  . GERD 02/23/2010  . OSTEOARTHRITIS 02/23/2010  . FIBROMYALGIA 02/23/2010   Outpatient Encounter Prescriptions as of 06/23/2015  Medication Sig  . aspirin 81 MG tablet Take 1 tablet (81 mg total) by mouth every other day.  Marland Kitchen atenolol (TENORMIN) 50 MG tablet TAKE 1 TABLET DAILY  . bimatoprost (LUMIGAN) 0.01 % SOLN 1 drop at bedtime.  . celecoxib (CELEBREX) 200 MG capsule Take 1 capsule (200 mg total) by mouth daily.  . Cholecalciferol (VITAMIN D3) 1000 UNITS CAPS Take 1 capsule by mouth daily.    . clorazepate (TRANXENE) 7.5 MG tablet TAKE 1 TABLET DAILY AS NEEDED  . fluticasone (FLONASE) 50 MCG/ACT nasal spray Place 2 sprays into the nose daily.  . hydrocortisone (PROCTOZONE-HC) 2.5 % rectal cream APPLY TO RECTALLY 2 TIMES A DAY AS NEEDED  . ibuprofen (ADVIL,MOTRIN) 200 MG tablet Take 200 mg by mouth every 6 (six) hours as needed.  . Multiple Vitamins-Minerals (SENIOR MULTIVITAMIN PLUS PO) Take by mouth daily.    . nitrofurantoin (MACRODANTIN) 100 MG capsule Take 1 capsule (100 mg total) by mouth 2 (two) times daily.  . Omeprazole Magnesium (PRILOSEC OTC PO) Take 1 capsule by mouth daily. For acid reflux  . simvastatin (ZOCOR) 80 MG tablet TAKE ONE TABLET AT BEDTIME  . TRAVATAN Z  0.004 % SOLN ophthalmic solution INSTILL 1 DROP INTO RIGHT EYE AT BEDTIME  . venlafaxine XR (EFFEXOR-XR) 75 MG 24 hr capsule TAKE (1) CAPSULE DAILY  . Vitamin B1-B12 (NEURO B-12 IJ) Inject 1 vial as directed every 30 (thirty) days.   Marland Kitchen zolpidem (AMBIEN) 10 MG tablet TAKE 1/2 TABLET AT BEDTIME AS NEEDED   Facility-Administered Encounter Medications as of 06/23/2015  Medication  . cyanocobalamin ((VITAMIN B-12)) injection 1,000 mcg      Review of Systems  Constitutional: Negative for fever.  HENT: Positive for congestion (green) and sinus pressure.   Eyes: Negative.   Respiratory: Positive for cough.   Cardiovascular: Negative.   Gastrointestinal: Negative.   Endocrine: Negative.   Genitourinary: Negative.   Musculoskeletal: Negative.   Skin: Negative.   Allergic/Immunologic: Negative.   Neurological: Positive for weakness and headaches.  Hematological: Negative.   Psychiatric/Behavioral: Negative.        Objective:   Physical Exam  Constitutional: She is oriented to person, place, and time. She appears well-developed and well-nourished. No distress.  HENT:  Head: Normocephalic and atraumatic.  Right Ear: External ear normal.  Left Ear: External ear normal.  Mouth/Throat: No oropharyngeal exudate.  Minimal redness in throat posteriorly with nasal congestion bilaterally, frontal sinus tenderness bilaterally  Eyes: Conjunctivae are normal. Pupils are equal, round, and reactive to light. Right eye exhibits no discharge. Left eye exhibits no discharge. No scleral icterus.  Neck: Normal range of  motion. Neck supple. No thyromegaly present.  Cardiovascular: Normal rate and regular rhythm.   No murmur heard. Pulmonary/Chest: Effort normal and breath sounds normal. She has no wheezes. She has no rales.  Dry cough  Neurological: She is alert and oriented to person, place, and time.  Skin: Skin is warm and dry. No rash noted.  Psychiatric: She has a normal mood and affect. Her  behavior is normal. Thought content normal.  Nursing note and vitals reviewed.    BP 118/69 mmHg  Pulse 73  Temp(Src) 98.2 F (36.8 C) (Oral)  Ht 5\' 5"  (1.651 m)  Wt 181 lb (82.101 kg)  BMI 30.12 kg/m2      Assessment & Plan:  1. Rhinosinusitis -Use nasal saline as directed and cool mist humidification - amoxicillin-clavulanate (AUGMENTIN) 875-125 MG tablet; Take 1 tablet by mouth 2 (two) times daily.  Dispense: 20 tablet; Refill: 0  2. Bronchospasm -Take Mucinex twice daily and drink plenty of fluids  Patient Instructions  Drink plenty of fluids and continue to stay well hydrated Take Mucinex maximum strength, blue and white in color, 1 twice daily with a large glass of water whether you are coughing are not Take antibiotic as directed twice daily with food Continue to use nasal saline frequently in each nostril   Arrie Senate MD

## 2015-06-28 DIAGNOSIS — C73 Malignant neoplasm of thyroid gland: Secondary | ICD-10-CM

## 2015-06-28 HISTORY — DX: Malignant neoplasm of thyroid gland: C73

## 2015-06-29 ENCOUNTER — Other Ambulatory Visit: Payer: Self-pay | Admitting: Family

## 2015-06-30 ENCOUNTER — Telehealth: Payer: Self-pay | Admitting: Family Medicine

## 2015-06-30 ENCOUNTER — Other Ambulatory Visit: Payer: Self-pay

## 2015-06-30 DIAGNOSIS — Z1231 Encounter for screening mammogram for malignant neoplasm of breast: Secondary | ICD-10-CM

## 2015-06-30 NOTE — Telephone Encounter (Signed)
Patient last seen in office on 12-27. Rx last filled on 10-3 for #30 with 1 RF. Please advise. If approved please route to Pool A so nurse can phone in to pharmacy

## 2015-06-30 NOTE — Telephone Encounter (Signed)
Can you run a referral through a second time or there is no reason to try again?

## 2015-07-01 ENCOUNTER — Other Ambulatory Visit: Payer: Self-pay | Admitting: General Surgery

## 2015-07-01 ENCOUNTER — Other Ambulatory Visit: Payer: Self-pay

## 2015-07-01 DIAGNOSIS — M25572 Pain in left ankle and joints of left foot: Secondary | ICD-10-CM

## 2015-07-01 DIAGNOSIS — M79661 Pain in right lower leg: Secondary | ICD-10-CM

## 2015-07-01 DIAGNOSIS — M79662 Pain in left lower leg: Secondary | ICD-10-CM

## 2015-07-01 NOTE — Telephone Encounter (Signed)
Please reschedule a possible

## 2015-07-01 NOTE — Telephone Encounter (Signed)
Please advise if you want to order this again

## 2015-07-02 ENCOUNTER — Other Ambulatory Visit (HOSPITAL_COMMUNITY): Payer: Self-pay | Admitting: General Surgery

## 2015-07-02 ENCOUNTER — Other Ambulatory Visit: Payer: Self-pay | Admitting: Family Medicine

## 2015-07-02 DIAGNOSIS — R52 Pain, unspecified: Secondary | ICD-10-CM

## 2015-07-02 DIAGNOSIS — M7989 Other specified soft tissue disorders: Secondary | ICD-10-CM

## 2015-07-07 ENCOUNTER — Ambulatory Visit (HOSPITAL_COMMUNITY)
Admission: RE | Admit: 2015-07-07 | Discharge: 2015-07-07 | Disposition: A | Discharge status: Home / Self Care | Payer: Medicare Other | Source: Ambulatory Visit | Attending: General Surgery | Admitting: General Surgery

## 2015-07-07 DIAGNOSIS — M7989 Other specified soft tissue disorders: Secondary | ICD-10-CM | POA: Diagnosis not present

## 2015-07-07 DIAGNOSIS — Z87828 Personal history of other (healed) physical injury and trauma: Secondary | ICD-10-CM | POA: Diagnosis not present

## 2015-07-07 NOTE — Progress Notes (Signed)
Preliminary results by tech - Venous Duplex Lower Ext. Completed. No evidence of deep or superficial vein thrombosis in both legs. Oda Cogan, BS, RDMS, RVT

## 2015-07-10 ENCOUNTER — Ambulatory Visit (INDEPENDENT_AMBULATORY_CARE_PROVIDER_SITE_OTHER): Payer: Medicare Other | Admitting: *Deleted

## 2015-07-10 DIAGNOSIS — E538 Deficiency of other specified B group vitamins: Secondary | ICD-10-CM | POA: Diagnosis not present

## 2015-07-10 NOTE — Patient Instructions (Signed)

## 2015-07-10 NOTE — Progress Notes (Signed)
Vitamin b12 injection given and patient tolerated well.  

## 2015-07-21 ENCOUNTER — Ambulatory Visit
Admission: RE | Admit: 2015-07-21 | Discharge: 2015-07-21 | Disposition: A | Discharge status: Home / Self Care | Payer: Medicare Other | Source: Ambulatory Visit | Attending: Family Medicine | Admitting: Family Medicine

## 2015-07-21 DIAGNOSIS — R52 Pain, unspecified: Secondary | ICD-10-CM

## 2015-07-22 ENCOUNTER — Encounter (HOSPITAL_COMMUNITY): Payer: Self-pay

## 2015-07-22 ENCOUNTER — Encounter (HOSPITAL_COMMUNITY)
Admission: RE | Admit: 2015-07-22 | Discharge: 2015-07-22 | Disposition: A | Discharge status: Home / Self Care | Payer: Medicare Other | Source: Ambulatory Visit | Attending: General Surgery | Admitting: General Surgery

## 2015-07-22 DIAGNOSIS — Z01810 Encounter for preprocedural cardiovascular examination: Secondary | ICD-10-CM | POA: Diagnosis present

## 2015-07-22 DIAGNOSIS — Z01812 Encounter for preprocedural laboratory examination: Secondary | ICD-10-CM | POA: Insufficient documentation

## 2015-07-22 HISTORY — DX: Gastro-esophageal reflux disease without esophagitis: K21.9

## 2015-07-22 HISTORY — DX: Aneurysm of the ascending aorta, without rupture: I71.21

## 2015-07-22 HISTORY — DX: Nontoxic single thyroid nodule: E04.1

## 2015-07-22 HISTORY — DX: Tremor, unspecified: R25.1

## 2015-07-22 HISTORY — DX: Thoracic aortic aneurysm, without rupture: I71.2

## 2015-07-22 HISTORY — DX: Personal history of (healed) traumatic fracture: Z87.81

## 2015-07-22 LAB — CBC WITH DIFFERENTIAL/PLATELET
BASOS PCT: 0 %
Basophils Absolute: 0 10*3/uL (ref 0.0–0.1)
EOS ABS: 0.2 10*3/uL (ref 0.0–0.7)
EOS PCT: 3 %
HCT: 37.1 % (ref 36.0–46.0)
HEMOGLOBIN: 11.9 g/dL — AB (ref 12.0–15.0)
LYMPHS ABS: 2.2 10*3/uL (ref 0.7–4.0)
Lymphocytes Relative: 42 %
MCH: 27.4 pg (ref 26.0–34.0)
MCHC: 32.1 g/dL (ref 30.0–36.0)
MCV: 85.3 fL (ref 78.0–100.0)
MONOS PCT: 6 %
Monocytes Absolute: 0.3 10*3/uL (ref 0.1–1.0)
NEUTROS PCT: 49 %
Neutro Abs: 2.5 10*3/uL (ref 1.7–7.7)
PLATELETS: 204 10*3/uL (ref 150–400)
RBC: 4.35 MIL/uL (ref 3.87–5.11)
RDW: 13.7 % (ref 11.5–15.5)
WBC: 5.1 10*3/uL (ref 4.0–10.5)

## 2015-07-22 LAB — COMPREHENSIVE METABOLIC PANEL
ALBUMIN: 4.1 g/dL (ref 3.5–5.0)
ALK PHOS: 92 U/L (ref 38–126)
ALT: 14 U/L (ref 14–54)
ANION GAP: 8 (ref 5–15)
AST: 23 U/L (ref 15–41)
BUN: 16 mg/dL (ref 6–20)
CHLORIDE: 107 mmol/L (ref 101–111)
CO2: 29 mmol/L (ref 22–32)
Calcium: 9.2 mg/dL (ref 8.9–10.3)
Creatinine, Ser: 0.64 mg/dL (ref 0.44–1.00)
GFR calc non Af Amer: >60 mL/min (ref >60)
GLUCOSE: 101 mg/dL — AB (ref 65–99)
POTASSIUM: 4.1 mmol/L (ref 3.5–5.1)
SODIUM: 144 mmol/L (ref 135–145)
Total Bilirubin: 0.7 mg/dL (ref 0.3–1.2)
Total Protein: 7 g/dL (ref 6.5–8.1)

## 2015-07-22 NOTE — Progress Notes (Signed)
Clearance note per Dr Lucianne Lei Tright per chart in regards to AAA 07/17/2015 CT chest / epic 04/15/2015 Stress test / epic 05/20/2013

## 2015-07-22 NOTE — Patient Instructions (Signed)
[name]  07/22/2015   Your procedure is scheduled on: Monday July 27, 2015   Report to [facility] Main  Entrance take [place]  elevators to 3rd floor to  [place] at 1:00 PM.  Call this number if you have problems the morning of surgery [phone]   Remember: ONLY 1 PERSON MAY GO WITH YOU TO SHORT STAY TO GET  READY MORNING OF [place].  Do not eat food After Midnight but may take clear liquids till 9:00 am day of surgery then nothing by mouth.      Take these medicines the morning of surgery with A SIP OF WATER: Atenolol; Clorazepate (Tranxane); Flonase if needed (bring with you day of surgery); Omeprazole (Prilosec); Venlafaxine (Effexor)                               You may not have any metal on your body including hair pins and              piercings  Do not wear jewelry, make-up, lotions, powders or perfumes, deodorant             Do not wear nail polish.  Do not shave  48 hours prior to surgery.              Do not bring valuables to the hospital. [place].  Contacts, dentures or bridgework may not be worn into surgery.  Leave suitcase in the car. After surgery it may be brought to your room.   _____________________________________________________________________             [facility] - Preparing for Surgery Before surgery, you can play an important role.  Because skin is not sterile, your skin needs to be as free of germs as possible.  You can reduce the number of germs on your skin by washing with CHG (chlorahexidine gluconate) soap before surgery.  CHG is an antiseptic cleaner which kills germs and bonds with the skin to continue killing germs even after washing. Please DO NOT use if you have an allergy to CHG or antibacterial soaps.  If your skin becomes reddened/irritated stop using the CHG and inform your nurse when you arrive at Short Stay. Do not shave (including legs and  underarms) for at least 48 hours prior to the first CHG shower.  You may shave your face/neck. Please follow these instructions carefully:  1.  Shower with CHG Soap the night before surgery and the  morning of Surgery.  2.  If you choose to wash your hair, wash your hair first as usual with your  normal  shampoo.  3.  After you shampoo, rinse your hair and body thoroughly to remove the  shampoo.                           4.  Use CHG as you would any other liquid soap.  You can apply chg directly  to the skin and wash                       Gently with a scrungie or clean washcloth.  5.  Apply the CHG Soap to your  body ONLY FROM THE NECK DOWN.   Do not use on face/ open                           Wound or open sores. Avoid contact with eyes, ears mouth and genitals (private parts).                       Wash face,  Genitals (private parts) with your normal soap.             6.  Wash thoroughly, paying special attention to the area where your surgery  will be performed.  7.  Thoroughly rinse your body with warm water from the neck down.  8.  DO NOT shower/wash with your normal soap after using and rinsing off  the CHG Soap.                9.  Pat yourself dry with a clean towel.            10.  Wear clean pajamas.            11.  Place clean sheets on your bed the night of your first shower and do not  sleep with pets. Day of Surgery : Do not apply any lotions/deodorants the morning of surgery.  Please wear clean clothes to the hospital/surgery center.  FAILURE TO FOLLOW THESE INSTRUCTIONS MAY RESULT IN THE CANCELLATION OF YOUR SURGERY PATIENT SIGNATURE_________________________________  NURSE SIGNATURE__________________________________  ________________________________________________________________________    CLEAR LIQUID DIET   Foods Allowed                                                                     Foods Excluded  Coffee and tea, regular and decaf                              liquids that you cannot  Plain Jell-O in any flavor                                             see through such as: Fruit ices (not with fruit pulp)                                     milk, soups, orange juice  Iced Popsicles                                    All solid food Carbonated beverages, regular and diet                                    Cranberry, grape and apple juices Sports drinks like Gatorade Lightly seasoned clear broth or consume(fat free) Sugar, honey syrup  Sample Menu Breakfast  Lunch                                     Supper Cranberry juice                    Beef broth                            Chicken broth Jell-O                                     Grape juice                           Apple juice Coffee or tea                        Jell-O                                      Popsicle                                                Coffee or tea                        Coffee or tea  _____________________________________________________________________

## 2015-07-23 NOTE — Progress Notes (Signed)
Dr Delma Post reviewed EKG/epic from 03/14/2013 and 07/22/2015. No orders given. Anesthesia to see pt day of surgery.

## 2015-07-24 NOTE — H&P (Signed)
[name]  Location: [facility] Patient #: [id number] DOB: 10/05/1940 Married / Language: English / Race: White Female   History of Present Illness  Patient words: f/u thyroid.  The patient is a 75 year old female who presents with a thyroid nodule. This pleasant 75 year old female returns to see me because of her thyroid nodules.  She does not perceive a mass in her neck. She's had some swallowing or choking problems but has had an extensive workup by Dr. Radene Journey which was negative. Recent chest x-ray and CT scan and neck ultrasound show an ascending aortic aneurysm, 4.7 cm in size and a large left thyroid nodule. Thyroid ultrasound reveals 2 nodules on the right, a cystic nodule on the left but also a 2.5 cm solid nodule with peripheral hypervascularity. Thyroid function test in June 2016 were normal. Listed in my previous note.  She's been evaluated by Dr. [name] for her ascending aortic aneurysm. He felt this was low risk for rupture and is going to see her in 6 months.  We sent her for FNA of the left thyroid nodule and that cytology report shows atypia of uncertain significance and follicular lesion of uncertain significance. I have discussed this with the patient and her husband. I have told her that there is somewhere between 10 and 20% chance that she has a low-grade thyroid malignancy. I spent some time distinguishing low-grade from high-grade and told her that the standard recommendation in this situation is a left thyroid lobectomy. I told her that if cancer was found that we would almost certainly need to go back and do a completion thyroidectomy on the right because of the multiple nodules. If this is benign nothing further would need to be done other than periodic follow-up. She is in favor of going ahead with this.  She also has had some lower extremity edema for a few weeks. She is mentioned this to Dr. Laurance Flatten. It is not really painful. I do  not think that she has DVT but were going to get a duplex ultrasound anyway to be sure. I told her to follow up with Dr. Laurance Flatten unless we find blood clots.  She will be scheduled for left thyroid lobectomy in the near future. She requests [facility] but will go to [facility] if there is a problem with scheduling availability. I discussed the indications, details, techniques, and numerous risk of this with her. She is aware of the risk of bleeding, infection, reoperation of cancer, temporary or permanent hoarseness from recurrent laryngeal nerve injury, temporary or permanent hypocalcemia from parathyroid gland injury, and other perceived problems. She understands these issues well. At this time all of her questions are answered. She agrees with this plan.   Addendum Note venous dopplers negative for DVT bilaterally   Allergies  Meloxicam *CHEMICALS* Sibutramine HCl Monohydrate *ADHD/ANTI-NARCOLEPSY/ANTI-OBESITY/ANOREXIANTS* Actonel *ENDOCRINE AND METABOLIC AGENTS - MISC.* Lyrica *ANTICONVULSANTS* Alendronate Sodium *ENDOCRINE AND METABOLIC AGENTS - MISC.* Levothyroxine Sodium *CHEMICALS* Lipitor *ANTIHYPERLIPIDEMICS*  Medication History  CeleBREX (200MG  Capsule, Oral) Active. PriLOSEC (40MG  Capsule DR, Oral) Active. Atenolol (50MG  Tablet, Oral) Active. Simvastatin (80MG  Tablet, Oral) Active. Venlafaxine HCl ER (75MG  Capsule ER 24HR, Oral) Active. Travatan Z (0.004% Solution, Ophthalmic) Active. Ambien (10MG  Tablet, Oral) Active. Lumigan (0.01% Solution, Ophthalmic) Active. Vitamin D (400UNIT Tablet, Oral) Active. Tranxene-T (7.5MG  Tablet, Oral) Active. Flonase (50MCG/ACT Suspension, Nasal) Active. Hydrocortisone (2.5% Cream, External) Active. Multiple Vitamin (Oral) Active. Medications Reconciled  Vitals  Weight: 186.6 lb Height: 65in Body Surface Area: 1.92  m Body Mass Index: 31.05 kg/m  Temp.: 98.19F  Pulse: 60 (Regular)  BP: 132/80  (Sitting, Left Arm, Standard)       Physical Exam General Mental Status-Alert. General Appearance-Not in acute distress. Build & Nutrition-Well nourished. Posture-Normal posture. Gait-Normal.  Head and Neck Note: The thyroid gland does not appear enlarged. No grossly dominant nodules noted. No adenopathy. Voice is strong. Trachea midline.   Chest and Lung Exam Chest and lung exam reveals -on auscultation, normal breath sounds, no adventitious sounds and normal vocal resonance.  Cardiovascular Cardiovascular examination reveals -normal heart sounds, regular rate and rhythm with no murmurs and femoral artery auscultation bilaterally reveals normal pulses, no bruits, no thrills.  Abdomen Inspection Inspection of the abdomen reveals - No Hernias. Palpation/Percussion Palpation and Percussion of the abdomen reveal - Soft, Non Tender, No Rigidity (guarding), No hepatosplenomegaly and No Palpable abdominal masses. Note: Right subcostal incision well-healed. Vertical incision in the epigastrium well-healed from previous hernia repair.   Neurologic Neurologic evaluation reveals -alert and oriented x 3 with no impairment of recent or remote memory, normal attention span and ability to concentrate, normal sensation and normal coordination.  Musculoskeletal Normal Exam - Bilateral-Upper Extremity Strength Normal and Lower Extremity Strength Normal. Note: Scar left shoulder from partial shoulder replacement following fracture.     Assessment & Plan  NON-FUNCTIONAL THYROID NODULE (E04.1) Current Plans     You have multiple nodules in your thyroid gland on both sides. The most likely diagnosis is a benign, nontoxic multinodular goiter However, the dominant nodule on your left side has been biopsied and has atypical cells There is a 10-20% chance that you have a low-grade malignancy of the thyroid gland Dr. Dalbert Batman has recommended a partial thyroidectomy,  removing just the left half. You'll be scheduled for left thyroid lobectomy in the near future. We have discussed the indications, techniques, and numerous risk of the surgery. Please read the written information that I had given you.  The leg swelling that you have is chronic and should probably be managed by Dr. Laurance Flatten. In the meantime, to be sure that you don't have any blood clots, you'll be scheduled for a duplex scan of the lower extremities to rule out blood clots.  DEPRESSION, CONTROLLED (F32.9) HISTORY OF INCISIONAL HERNIA REPAIR (Z98.890) HISTORY OF CHOLECYSTECTOMY (Z90.49) CHRONIC GERD (K21.9) ASCENDING AORTIC ANEURYSM (I71.2) LEG SWELLING (M79.89) Current Plans DUPLEX SCAN OF BOTH LOWER EXTREMITIES [phone])   Edsel Petrin. Dalbert Batman, M.D., [facility] General and Minimally invasive Surgery Breast and Colorectal Surgery Office:   [phone] Pager:   [phone]

## 2015-07-27 ENCOUNTER — Encounter (HOSPITAL_COMMUNITY): Payer: Self-pay | Admitting: *Deleted

## 2015-07-27 ENCOUNTER — Encounter (HOSPITAL_COMMUNITY)
Admission: RE | Disposition: A | Discharge status: Home / Self Care | Payer: Self-pay | Source: Ambulatory Visit | Attending: General Surgery

## 2015-07-27 ENCOUNTER — Ambulatory Visit (HOSPITAL_COMMUNITY): Payer: Medicare Other | Admitting: Anesthesiology

## 2015-07-27 ENCOUNTER — Ambulatory Visit (HOSPITAL_COMMUNITY)
Admission: RE | Admit: 2015-07-27 | Discharge: 2015-07-28 | Disposition: A | Discharge status: Home / Self Care | Payer: Medicare Other | Source: Ambulatory Visit | Attending: General Surgery | Admitting: General Surgery

## 2015-07-27 DIAGNOSIS — G709 Myoneural disorder, unspecified: Secondary | ICD-10-CM | POA: Diagnosis not present

## 2015-07-27 DIAGNOSIS — K219 Gastro-esophageal reflux disease without esophagitis: Secondary | ICD-10-CM | POA: Insufficient documentation

## 2015-07-27 DIAGNOSIS — Z85850 Personal history of malignant neoplasm of thyroid: Secondary | ICD-10-CM | POA: Diagnosis present

## 2015-07-27 DIAGNOSIS — I739 Peripheral vascular disease, unspecified: Secondary | ICD-10-CM | POA: Diagnosis not present

## 2015-07-27 DIAGNOSIS — Z791 Long term (current) use of non-steroidal anti-inflammatories (NSAID): Secondary | ICD-10-CM | POA: Insufficient documentation

## 2015-07-27 DIAGNOSIS — Z7951 Long term (current) use of inhaled steroids: Secondary | ICD-10-CM | POA: Diagnosis not present

## 2015-07-27 DIAGNOSIS — E041 Nontoxic single thyroid nodule: Secondary | ICD-10-CM | POA: Diagnosis present

## 2015-07-27 DIAGNOSIS — I10 Essential (primary) hypertension: Secondary | ICD-10-CM | POA: Insufficient documentation

## 2015-07-27 DIAGNOSIS — K449 Diaphragmatic hernia without obstruction or gangrene: Secondary | ICD-10-CM | POA: Insufficient documentation

## 2015-07-27 DIAGNOSIS — Z79899 Other long term (current) drug therapy: Secondary | ICD-10-CM | POA: Insufficient documentation

## 2015-07-27 DIAGNOSIS — I712 Thoracic aortic aneurysm, without rupture: Secondary | ICD-10-CM | POA: Diagnosis not present

## 2015-07-27 DIAGNOSIS — M797 Fibromyalgia: Secondary | ICD-10-CM | POA: Insufficient documentation

## 2015-07-27 DIAGNOSIS — M7989 Other specified soft tissue disorders: Secondary | ICD-10-CM | POA: Insufficient documentation

## 2015-07-27 DIAGNOSIS — F329 Major depressive disorder, single episode, unspecified: Secondary | ICD-10-CM | POA: Diagnosis not present

## 2015-07-27 DIAGNOSIS — M1990 Unspecified osteoarthritis, unspecified site: Secondary | ICD-10-CM | POA: Insufficient documentation

## 2015-07-27 DIAGNOSIS — H409 Unspecified glaucoma: Secondary | ICD-10-CM | POA: Insufficient documentation

## 2015-07-27 DIAGNOSIS — C73 Malignant neoplasm of thyroid gland: Secondary | ICD-10-CM | POA: Diagnosis not present

## 2015-07-27 HISTORY — PX: THYROID LOBECTOMY: SHX420

## 2015-07-27 LAB — CBC
HEMATOCRIT: 37.8 % (ref 36.0–46.0)
Hemoglobin: 12 g/dL (ref 12.0–15.0)
MCH: 27.4 pg (ref 26.0–34.0)
MCHC: 31.7 g/dL (ref 30.0–36.0)
MCV: 86.3 fL (ref 78.0–100.0)
PLATELETS: 196 10*3/uL (ref 150–400)
RBC: 4.38 MIL/uL (ref 3.87–5.11)
RDW: 13.9 % (ref 11.5–15.5)
WBC: 7 10*3/uL (ref 4.0–10.5)

## 2015-07-27 LAB — CREATININE, SERUM: Creatinine, Ser: 0.72 mg/dL (ref 0.44–1.00)

## 2015-07-27 SURGERY — LOBECTOMY, THYROID
Anesthesia: General | Site: Neck | Laterality: Left

## 2015-07-27 MED ORDER — LABETALOL HCL 5 MG/ML IV SOLN
INTRAVENOUS | Status: AC
  Filled 2015-07-27: qty 4

## 2015-07-27 MED ORDER — ADULT MULTIVITAMIN W/MINERALS CH
ORAL_TABLET | Freq: Every day | ORAL | Status: DC
  Administered 2015-07-28: 1 via ORAL
  Filled 2015-07-27: qty 1

## 2015-07-27 MED ORDER — CEFAZOLIN SODIUM-DEXTROSE 2-3 GM-% IV SOLR
2.0000 g | INTRAVENOUS | Status: AC
  Administered 2015-07-27: 2 g via INTRAVENOUS

## 2015-07-27 MED ORDER — BUPIVACAINE-EPINEPHRINE (PF) 0.25% -1:200000 IJ SOLN
INTRAMUSCULAR | Status: AC
  Filled 2015-07-27: qty 30

## 2015-07-27 MED ORDER — ONDANSETRON HCL 4 MG/2ML IJ SOLN
INTRAMUSCULAR | Status: AC
  Filled 2015-07-27: qty 2

## 2015-07-27 MED ORDER — SUCCINYLCHOLINE CHLORIDE 20 MG/ML IJ SOLN
INTRAMUSCULAR | Status: DC | PRN
  Administered 2015-07-27: 60 mg via INTRAVENOUS

## 2015-07-27 MED ORDER — PANTOPRAZOLE SODIUM 40 MG PO TBEC
40.0000 mg | DELAYED_RELEASE_TABLET | Freq: Every day | ORAL | Status: DC
  Administered 2015-07-28: 40 mg via ORAL
  Filled 2015-07-27 (×2): qty 1

## 2015-07-27 MED ORDER — LATANOPROST 0.005 % OP SOLN
1.0000 [drp] | Freq: Every day | OPHTHALMIC | Status: DC
  Administered 2015-07-27: 1 [drp] via OPHTHALMIC
  Filled 2015-07-27: qty 2.5

## 2015-07-27 MED ORDER — EPHEDRINE SULFATE 50 MG/ML IJ SOLN
INTRAMUSCULAR | Status: DC | PRN
  Administered 2015-07-27 (×2): 5 mg via INTRAVENOUS

## 2015-07-27 MED ORDER — FLUTICASONE PROPIONATE 50 MCG/ACT NA SUSP
2.0000 | Freq: Every day | NASAL | Status: DC
  Filled 2015-07-27: qty 16

## 2015-07-27 MED ORDER — POLYETHYLENE GLYCOL 3350 17 G PO PACK: 17.0000 g | PACK | Freq: Every day | ORAL | Status: DC | PRN

## 2015-07-27 MED ORDER — ATORVASTATIN CALCIUM 40 MG PO TABS
40.0000 mg | ORAL_TABLET | Freq: Every day | ORAL | Status: DC
  Filled 2015-07-27: qty 1

## 2015-07-27 MED ORDER — LIDOCAINE HCL (CARDIAC) 20 MG/ML IV SOLN
INTRAVENOUS | Status: DC | PRN
  Administered 2015-07-27: 50 mg via INTRAVENOUS

## 2015-07-27 MED ORDER — POTASSIUM CHLORIDE IN NACL 20-0.9 MEQ/L-% IV SOLN
INTRAVENOUS | Status: DC
  Administered 2015-07-27 – 2015-07-28 (×2): via INTRAVENOUS
  Filled 2015-07-27 (×3): qty 1000

## 2015-07-27 MED ORDER — HYDROMORPHONE HCL 1 MG/ML IJ SOLN: 1.0000 mg | INTRAMUSCULAR | Status: DC | PRN

## 2015-07-27 MED ORDER — SUGAMMADEX SODIUM 200 MG/2ML IV SOLN
INTRAVENOUS | Status: AC
  Filled 2015-07-27: qty 2

## 2015-07-27 MED ORDER — ROCURONIUM BROMIDE 100 MG/10ML IV SOLN
INTRAVENOUS | Status: AC
  Filled 2015-07-27: qty 1

## 2015-07-27 MED ORDER — PROPOFOL 10 MG/ML IV BOLUS
INTRAVENOUS | Status: DC | PRN
  Administered 2015-07-27: 50 mg via INTRAVENOUS
  Administered 2015-07-27: 120 mg via INTRAVENOUS

## 2015-07-27 MED ORDER — VENLAFAXINE HCL ER 75 MG PO CP24
75.0000 mg | ORAL_CAPSULE | Freq: Every day | ORAL | Status: DC
  Administered 2015-07-28: 75 mg via ORAL
  Filled 2015-07-27 (×2): qty 1

## 2015-07-27 MED ORDER — ONDANSETRON HCL 4 MG/2ML IJ SOLN: 4.0000 mg | Freq: Once | INTRAMUSCULAR | Status: DC | PRN

## 2015-07-27 MED ORDER — VITAMIN D3 25 MCG (1000 UNIT) PO TABS
1000.0000 [IU] | ORAL_TABLET | Freq: Every day | ORAL | Status: DC
  Administered 2015-07-28: 1000 [IU] via ORAL
  Filled 2015-07-27: qty 1

## 2015-07-27 MED ORDER — ROCURONIUM BROMIDE 100 MG/10ML IV SOLN
INTRAVENOUS | Status: DC | PRN
  Administered 2015-07-27: 10 mg via INTRAVENOUS
  Administered 2015-07-27: 30 mg via INTRAVENOUS

## 2015-07-27 MED ORDER — PROPOFOL 10 MG/ML IV BOLUS
INTRAVENOUS | Status: AC
  Filled 2015-07-27: qty 20

## 2015-07-27 MED ORDER — LIDOCAINE HCL (CARDIAC) 20 MG/ML IV SOLN
INTRAVENOUS | Status: AC
  Filled 2015-07-27: qty 5

## 2015-07-27 MED ORDER — OXYCODONE-ACETAMINOPHEN 5-325 MG PO TABS
1.0000 | ORAL_TABLET | ORAL | Status: DC | PRN
  Administered 2015-07-27: 2 via ORAL
  Filled 2015-07-27: qty 2

## 2015-07-27 MED ORDER — BUPIVACAINE-EPINEPHRINE 0.25% -1:200000 IJ SOLN
INTRAMUSCULAR | Status: DC | PRN
  Administered 2015-07-27: 5 mL

## 2015-07-27 MED ORDER — LABETALOL HCL 5 MG/ML IV SOLN
INTRAVENOUS | Status: DC | PRN
  Administered 2015-07-27: 5 mg via INTRAVENOUS

## 2015-07-27 MED ORDER — FENTANYL CITRATE (PF) 100 MCG/2ML IJ SOLN
INTRAMUSCULAR | Status: DC | PRN
  Administered 2015-07-27 (×2): 50 ug via INTRAVENOUS
  Administered 2015-07-27: 100 ug via INTRAVENOUS
  Administered 2015-07-27: 50 ug via INTRAVENOUS

## 2015-07-27 MED ORDER — CEFAZOLIN SODIUM-DEXTROSE 2-3 GM-% IV SOLR
INTRAVENOUS | Status: AC
  Filled 2015-07-27: qty 50

## 2015-07-27 MED ORDER — DEXAMETHASONE SODIUM PHOSPHATE 10 MG/ML IJ SOLN
INTRAMUSCULAR | Status: DC | PRN
  Administered 2015-07-27: 10 mg via INTRAVENOUS

## 2015-07-27 MED ORDER — ONDANSETRON HCL 4 MG/2ML IJ SOLN
INTRAMUSCULAR | Status: DC | PRN
  Administered 2015-07-27: 4 mg via INTRAVENOUS

## 2015-07-27 MED ORDER — ENOXAPARIN SODIUM 40 MG/0.4ML ~~LOC~~ SOLN
40.0000 mg | SUBCUTANEOUS | Status: DC
  Administered 2015-07-28: 40 mg via SUBCUTANEOUS
  Filled 2015-07-27: qty 0.4

## 2015-07-27 MED ORDER — ATENOLOL 50 MG PO TABS
50.0000 mg | ORAL_TABLET | Freq: Every day | ORAL | Status: DC
  Administered 2015-07-28: 50 mg via ORAL
  Filled 2015-07-27: qty 1

## 2015-07-27 MED ORDER — CHLORHEXIDINE GLUCONATE 4 % EX LIQD: 1.0000 "application " | Freq: Once | CUTANEOUS | Status: DC

## 2015-07-27 MED ORDER — FENTANYL CITRATE (PF) 250 MCG/5ML IJ SOLN
INTRAMUSCULAR | Status: AC
  Filled 2015-07-27: qty 5

## 2015-07-27 MED ORDER — 0.9 % SODIUM CHLORIDE (POUR BTL) OPTIME
TOPICAL | Status: DC | PRN
  Administered 2015-07-27: 1000 mL

## 2015-07-27 MED ORDER — DEXAMETHASONE SODIUM PHOSPHATE 10 MG/ML IJ SOLN
INTRAMUSCULAR | Status: AC
  Filled 2015-07-27: qty 2

## 2015-07-27 MED ORDER — SUGAMMADEX SODIUM 200 MG/2ML IV SOLN
INTRAVENOUS | Status: DC | PRN
  Administered 2015-07-27: 160 mg via INTRAVENOUS

## 2015-07-27 MED ORDER — LABETALOL HCL 5 MG/ML IV SOLN
5.0000 mg | INTRAVENOUS | Status: DC | PRN
  Administered 2015-07-27: 5 mg via INTRAVENOUS

## 2015-07-27 MED ORDER — ZOLPIDEM TARTRATE 5 MG PO TABS
5.0000 mg | ORAL_TABLET | Freq: Every day | ORAL | Status: DC
  Administered 2015-07-27: 5 mg via ORAL
  Filled 2015-07-27: qty 1

## 2015-07-27 MED ORDER — FENTANYL CITRATE (PF) 100 MCG/2ML IJ SOLN
INTRAMUSCULAR | Status: AC
  Filled 2015-07-27: qty 2

## 2015-07-27 MED ORDER — FENTANYL CITRATE (PF) 100 MCG/2ML IJ SOLN
25.0000 ug | INTRAMUSCULAR | Status: DC | PRN
  Administered 2015-07-27 (×4): 25 ug via INTRAVENOUS

## 2015-07-27 MED ORDER — LACTATED RINGERS IV SOLN
INTRAVENOUS | Status: DC
  Administered 2015-07-27: 1000 mL via INTRAVENOUS
  Administered 2015-07-27: 18:00:00 via INTRAVENOUS

## 2015-07-27 SURGICAL SUPPLY — 44 items
ADH SKN CLS APL DERMABOND .7 (GAUZE/BANDAGES/DRESSINGS) ×1
APL SKNCLS STERI-STRIP NONHPOA (GAUZE/BANDAGES/DRESSINGS) ×1
ATTRACTOMAT 16X20 MAGNETIC DRP (DRAPES) ×2 IMPLANT
BENZOIN TINCTURE PRP APPL 2/3 (GAUZE/BANDAGES/DRESSINGS) ×2 IMPLANT
BLADE HEX COATED 2.75 (ELECTRODE) ×1 IMPLANT
BLADE SURG 15 STRL LF DISP TIS (BLADE) ×1 IMPLANT
BLADE SURG 15 STRL SS (BLADE) ×2
CHLORAPREP W/TINT 26ML (MISCELLANEOUS) ×2 IMPLANT
CLIP TI MEDIUM 6 (CLIP) ×4 IMPLANT
CLIP TI WIDE RED SMALL 6 (CLIP) ×5 IMPLANT
COVER SURGICAL LIGHT HANDLE (MISCELLANEOUS) ×2 IMPLANT
DERMABOND ADVANCED (GAUZE/BANDAGES/DRESSINGS) ×1
DERMABOND ADVANCED .7 DNX12 (GAUZE/BANDAGES/DRESSINGS) IMPLANT
DISSECTOR ROUND CHERRY 3/8 STR (MISCELLANEOUS) ×2 IMPLANT
DRAPE LAPAROTOMY T 98X78 PEDS (DRAPES) ×2 IMPLANT
DRESSING SURGICEL FIBRLLR 1X2 (HEMOSTASIS) ×1 IMPLANT
DRSG SURGICEL FIBRILLAR 1X2 (HEMOSTASIS) ×2
ELECT PENCIL ROCKER SW 15FT (MISCELLANEOUS) ×2 IMPLANT
ELECT REM PT RETURN 9FT ADLT (ELECTROSURGICAL) ×2
ELECTRODE REM PT RTRN 9FT ADLT (ELECTROSURGICAL) ×1 IMPLANT
GAUZE SPONGE 4X4 12PLY STRL (GAUZE/BANDAGES/DRESSINGS) ×1 IMPLANT
GAUZE SPONGE 4X4 16PLY XRAY LF (GAUZE/BANDAGES/DRESSINGS) ×4 IMPLANT
GLOVE BIOGEL PI IND STRL 6.5 (GLOVE) IMPLANT
GLOVE BIOGEL PI INDICATOR 6.5 (GLOVE) ×2
GLOVE EUDERMIC 7 POWDERFREE (GLOVE) ×1 IMPLANT
GLOVE SURG SS PI 6.5 STRL IVOR (GLOVE) ×2 IMPLANT
GLOVE SURG SS PI 7.0 STRL IVOR (GLOVE) ×1 IMPLANT
GOWN STRL REUS W/TWL LRG LVL3 (GOWN DISPOSABLE) ×2 IMPLANT
GOWN STRL REUS W/TWL XL LVL3 (GOWN DISPOSABLE) ×4 IMPLANT
KIT BASIN OR (CUSTOM PROCEDURE TRAY) ×2 IMPLANT
NS IRRIG 1000ML POUR BTL (IV SOLUTION) ×2 IMPLANT
PACK BASIC VI WITH GOWN DISP (CUSTOM PROCEDURE TRAY) ×2 IMPLANT
SHEARS HARMONIC 9CM CVD (BLADE) ×2 IMPLANT
STAPLER VISISTAT 35W (STAPLE) ×2 IMPLANT
STRIP CLOSURE SKIN 1/2X4 (GAUZE/BANDAGES/DRESSINGS) ×2 IMPLANT
SUT MNCRL AB 4-0 PS2 18 (SUTURE) ×2 IMPLANT
SUT SILK 2 0 (SUTURE) ×2
SUT SILK 2-0 18XBRD TIE 12 (SUTURE) ×1 IMPLANT
SUT SILK 3 0 (SUTURE) ×2
SUT SILK 3-0 18XBRD TIE 12 (SUTURE) IMPLANT
SUT VIC AB 3-0 SH 18 (SUTURE) ×3 IMPLANT
SYR BULB IRRIGATION 50ML (SYRINGE) ×2 IMPLANT
TOWEL OR 17X26 10 PK STRL BLUE (TOWEL DISPOSABLE) ×2 IMPLANT
YANKAUER SUCT BULB TIP 10FT TU (MISCELLANEOUS) ×2 IMPLANT

## 2015-07-27 NOTE — Transfer of Care (Signed)
Immediate Anesthesia Transfer of Care Note  Patient: [name]  Procedure(s) Performed: Procedure(s): LEFT THYROID LOBECTOMY (Left)  Patient Location: PACU  Anesthesia Type:General  Level of Consciousness: awake and oriented  Airway & Oxygen Therapy: Patient Spontanous Breathing and Patient connected to face mask oxygen  Post-op Assessment: Report given to RN and Post -op Vital signs reviewed and stable  Post vital signs: Reviewed and stable  Last Vitals:  Filed Vitals:   07/27/15 1314  BP: 150/80  Pulse: 62  Temp: 36.7 C  Resp: 18    Complications: No apparent anesthesia complications

## 2015-07-27 NOTE — Op Note (Signed)
Patient Name:           [name]   Date of Surgery:        07/27/2015  Pre op Diagnosis:      Left thyroid nodule with atypia  Post op Diagnosis:    Same  Procedure:                 Left thyroid lobectomy  Surgeon:                     Edsel Petrin. Dalbert Batman, M.D., FACS  Assistant:                      Eloise Harman, [place] student  Operative Indications:   The patient is a 75 year old female who presents with a thyroid nodule.       She does not perceive a mass in her neck. She's had some swallowing or choking problems but has had an extensive workup by Dr. Radene Journey which was negative. Recent chest x-ray and CT scan and neck ultrasound show an ascending aortic aneurysm, 4.7 cm in size and a large left thyroid nodule. Thyroid ultrasound reveals 2 nodules on the right, a cystic nodule on the left but also a 2.5 cm solid nodule with peripheral hypervascularity. Thyroid function test in June 2016 were normal.       She's been evaluated by Dr. [name] for her ascending aortic aneurysm. He felt this was low risk for rupture and is going to see her in 6 months.      We sent her for FNA of the left thyroid nodule and that cytology report shows atypia of uncertain significance and follicular lesion of uncertain significance. I have discussed this with the patient and her husband. I have told her that there is somewhere between 10 and 20% chance that she has a low-grade thyroid malignancy. I spent some time distinguishing low-grade from high-grade and told her that the standard recommendation in this situation is a left thyroid lobectomy. I told her that if cancer was found that we would almost certainly need to go back and do a completion thyroidectomy on the right because of the multiple nodules. If this is benign nothing further would need to be done other than periodic follow-up. She is in favor of going ahead with this.      She will be scheduled for left thyroid lobectomy in the  near future.   Operative Findings:       There was a smooth firm palpable nodule in the mid to upper left thyroid lobe.  There was no adenopathy or gross signs of malignancy.  The inferior parathyroid gland was preserved and visualized.  I felt that I saw a tiny superior parathyroid gland.  We clearly identified the recurrent laryngeal nerve on the left and preserved that.  Procedure in Detail:          Following the induction of general endotracheal anesthesia the patient was positioned with a roll behind her shoulders and her neck extended.  She was placed in reverse Trendelenburg position.  The neck was prepped and draped in a sterile fashion.  Surgical timeout was performed.  Intravenous antibiotics were given.  0.5% Marcaine with epinephrine was used as local infiltration anesthetic.      A transverse skin crease incision was made about 2 cm above the suprasternal notch.  Dissection was carried down through the platysma muscle.  Skin and  platysmal flaps were raised superiorly and inferiorly and a self-retaining retractor was placed.  The strap muscles were divided in the midline.  A couple of venous structures were clamped divided and ligated with silk ties.  The strap muscles were then dissected off of the left thyroid lobe.  I exposed the inferior pole on the left and took down venous tributaries between small clips and harmonic scalpel.  I then mobilized the superior pole and took down and skeletonized all the superior pole structures.  Metal clips and harmonic scalpel were used to take this down.  We stayed right in the capsule of the thyroid to do this.  I felt that I saw a small superior parathyroid gland and a normal-sized inferior parathyroid gland.  I then mobilized the left thyroid lobe from lateral to medial.  I did this in small steps staying in the capsule of the thyroid.  Takedown the middle thyroid vein between metal clips.  I clearly identified the recurrent laryngeal nerve and preserved  that.  Slowly took the dissection up onto the trachea and across the trachea at the isthmus.  I marked the superior pole with a suture.  I divided the isthmus with the Harmonic scalpel.  Specimen was sent to pathology.  The wound was irrigated saline.  Hemostasis looked good.  I placed several pieces of fibrillar hemostatic sponge in the wound bed and it remained hemostatic.  Strap muscles were closed in the midline with interrupted Vicryl sutures.  The platysma muscle was closed with interrupted Vicryl sutures and the skin closed with a running subcuticular 4-0 Monocryl and Dermabond.  The patient tolerated the procedure well was taken to PACU in stable condition.  EBL 25 mL.  Counts correct.  Complications none.     Edsel Petrin. Dalbert Batman, M.D., FACS General and Minimally Invasive Surgery Breast and Colorectal Surgery  07/27/2015 5:10 PM

## 2015-07-27 NOTE — Anesthesia Postprocedure Evaluation (Signed)
Anesthesia Post Note  Patient: [name]  Procedure(s) Performed: Procedure(s) (LRB): LEFT THYROID LOBECTOMY (Left)  Patient location during evaluation: PACU Anesthesia Type: General Level of consciousness: awake and alert Pain management: pain level controlled Vital Signs Assessment: post-procedure vital signs reviewed and stable Respiratory status: spontaneous breathing, nonlabored ventilation, respiratory function stable and patient connected to nasal cannula oxygen Cardiovascular status: blood pressure returned to baseline and stable Postop Assessment: no signs of nausea or vomiting Anesthetic complications: no    Last Vitals:  Filed Vitals:   07/27/15 1314 07/27/15 1704  BP: 150/80 181/87  Pulse: 62 82  Temp: 36.7 C 36.5 C  Resp: 18 15    Last Pain: There were no vitals filed for this visit.               Zenaida Deed

## 2015-07-27 NOTE — Anesthesia Preprocedure Evaluation (Signed)
Anesthesia Evaluation  Patient identified by MRN, date of birth, ID band Patient awake    Reviewed: Allergy & Precautions, H&P , NPO status , Patient's Chart, lab work & pertinent test results, reviewed documented beta blocker date and time   Airway Mallampati: II  TM Distance: >3 FB Neck ROM: Full    Dental no notable dental hx. (+) Teeth Intact, Missing   Pulmonary neg pulmonary ROS,    Pulmonary exam normal breath sounds clear to auscultation       Cardiovascular hypertension, Pt. on medications and Pt. on home beta blockers + Peripheral Vascular Disease  Normal cardiovascular exam Rhythm:Regular Rate:Normal  9/11 stress test- no ischemia, EF 72%  Ascending aortic dilation at 4.7cm on 03/2015   Neuro/Psych Anxiety Depression  Neuromuscular disease    GI/Hepatic hiatal hernia, GERD  Controlled and Medicated,  Endo/Other    Renal/GU      Musculoskeletal  (+) Arthritis , Fibromyalgia - (celebrex)  Abdominal   Peds  Hematology   Anesthesia Other Findings Glaucoma bilateral  Reproductive/Obstetrics                             Anesthesia Physical  Anesthesia Plan  ASA: III  Anesthesia Plan: General   Post-op Pain Management: MAC Combined w/ Regional for Post-op pain   Induction: Intravenous  Airway Management Planned: Oral ETT  Additional Equipment:   Intra-op Plan:   Post-operative Plan: Extubation in OR  Informed Consent: I have reviewed the patients History and Physical, chart, labs and discussed the procedure including the risks, benefits and alternatives for the proposed anesthesia with the patient or authorized representative who has indicated his/her understanding and acceptance.   Dental advisory given  Plan Discussed with: CRNA and Surgeon  Anesthesia Plan Comments:         Anesthesia Quick Evaluation

## 2015-07-27 NOTE — Anesthesia Procedure Notes (Signed)
Procedure Name: Intubation Date/Time: 07/27/2015 3:20 PM Performed by: Danley Danker L Patient Re-evaluated:Patient Re-evaluated prior to inductionOxygen Delivery Method: Circle system utilized Preoxygenation: Pre-oxygenation with 100% oxygen Intubation Type: IV induction Ventilation: Mask ventilation without difficulty and Oral airway inserted - appropriate to patient size Laryngoscope Size: Sabra Heck and 2 Grade View: Grade I Tube type: Reinforced Tube size: 7.0 mm Number of attempts: 1 Airway Equipment and Method: Stylet Placement Confirmation: ETT inserted through vocal cords under direct vision,  breath sounds checked- equal and bilateral and positive ETCO2 Secured at: 21 cm Tube secured with: Tape Dental Injury: Teeth and Oropharynx as per pre-operative assessment

## 2015-07-27 NOTE — Interval H&P Note (Signed)
History and Physical Interval Note:  07/27/2015 2:38 PM  [name]  has presented today for surgery, with the diagnosis of atypical thyroid nodule  The various methods of treatment have been discussed with the patient and family. After consideration of risks, benefits and other options for treatment, the patient has consented to  Procedure(s): LEFT THYROID LOBECTOMY (Left) as a surgical intervention .  The patient's history has been reviewed, patient examined, no change in status, stable for surgery.  I have reviewed the patient's chart and labs.  Questions were answered to the patient's satisfaction.     [name]

## 2015-07-28 ENCOUNTER — Encounter (HOSPITAL_COMMUNITY): Payer: Self-pay | Admitting: General Surgery

## 2015-07-28 DIAGNOSIS — C73 Malignant neoplasm of thyroid gland: Secondary | ICD-10-CM | POA: Diagnosis not present

## 2015-07-28 MED ORDER — HYDROCODONE-ACETAMINOPHEN 5-325 MG PO TABS: 1.0000 | ORAL_TABLET | Freq: Four times a day (QID) | ORAL | Status: DC | PRN

## 2015-07-28 NOTE — Discharge Summary (Signed)
Patient ID: [name] [id number] 75 y.o. March 15, 1941  Admit date: 07/27/2015  Discharge date and time: 07/28/2015  Admitting Physician: [name]  Discharge Physician: [name]  Admission Diagnoses: atypical thyroid nodule  Discharge Diagnoses: Same  Operations: Procedure(s): LEFT THYROID LOBECTOMY  Admission Condition: good  Discharged Condition: good  Indication for Admission: The patient is a 75 year old female who presents with a thyroid nodule.   She does not perceive a mass in her neck. She's had some swallowing or choking problems but has had an extensive workup by Dr. Radene Journey which was negative. Recent chest x-ray and CT scan and neck ultrasound show an ascending aortic aneurysm, 4.7 cm in size and a large left thyroid nodule. Thyroid ultrasound reveals 2 nodules on the right, a cystic nodule on the left but also a 2.5 cm solid nodule with peripheral hypervascularity. Thyroid function test in June 2016 were normal.   She's been evaluated by Dr. [name] for her ascending aortic aneurysm. He felt this was low risk for rupture and is going to see her in 6 months.  We sent her for FNA of the left thyroid nodule and that cytology report shows atypia of uncertain significance and follicular lesion of uncertain significance. I have discussed this with the patient and her husband. I have told her that there is somewhere between 10 and 20% chance that she has a low-grade thyroid malignancy. I spent some time distinguishing low-grade from high-grade and told her that the standard recommendation in this situation is a left thyroid lobectomy. I told her that if cancer was found that we would almost certainly need to go back and do a completion thyroidectomy on the right because of the multiple nodules. If this is benign nothing further would need to be done other than periodic follow-up. She is in favor of going ahead with this, and is brought  to the hospital electively for this surgery.  Hospital Course: On the day of admission the patient was taken to the operating room and underwent left thyroid lobectomy.  I found a smooth palpable nodule in the mid to upper left thyroid lobe completely contained within the gland proximal to 2 cm in size.  There is no adenopathy or gross signs of malignancy.  She was observed overnight and had no problems.  On postop day 1 she was happy and in good spirits.  Her voice was strong.  Her wound was soft without hematoma although there was a little ecchymoses on the inferior flap.  No fluid collection.  Good range of motion.  No paresthesias.  She was given instruction in diet and activities.  I told her we'll get the pathology report to her by phone in 24-48 hours.  She will return to see me in the office in about 3 weeks.  Consults: None  Significant Diagnostic Studies: Surgical pathology, pending  Treatments: surgery: Left thyroid lobectomy  Disposition: Home  Patient Instructions:    Medication List    STOP taking these medications        amoxicillin-clavulanate 875-125 MG tablet  Commonly known as:  AUGMENTIN      TAKE these medications        aspirin 81 MG tablet  Take 1 tablet (81 mg total) by mouth every other day.     atenolol 50 MG tablet  Commonly known as:  TENORMIN  TAKE 1 TABLET DAILY     celecoxib 200 MG capsule  Commonly known as:  CELEBREX  Take 1 capsule (200 mg total) by mouth daily.     clorazepate 7.5 MG tablet  Commonly known as:  TRANXENE  TAKE 1 TABLET DAILY AS NEEDED     FISH OIL PO  Take 1 capsule by mouth daily.     fluticasone 50 MCG/ACT nasal spray  Commonly known as:  FLONASE  Place 2 sprays into the nose daily.     HYDROcodone-acetaminophen 5-325 MG tablet  Commonly known as:  NORCO  Take 1-2 tablets by mouth every 6 (six) hours as needed.     hydrocortisone 2.5 % rectal cream  Commonly known as:  PROCTOZONE-HC  APPLY TO RECTALLY 2 TIMES A  DAY AS NEEDED     ibuprofen 200 MG tablet  Commonly known as:  ADVIL,MOTRIN  Take 200 mg by mouth every 6 (six) hours as needed.     nitrofurantoin 100 MG capsule  Commonly known as:  MACRODANTIN  Take 1 capsule (100 mg total) by mouth 2 (two) times daily.     omeprazole 20 MG capsule  Commonly known as:  PRILOSEC  Take 20 mg by mouth 2 (two) times daily.     SENIOR MULTIVITAMIN PLUS PO  Take by mouth daily.     simvastatin 80 MG tablet  Commonly known as:  ZOCOR  TAKE ONE TABLET AT BEDTIME     TRAVATAN Z 0.004 % Soln ophthalmic solution  Generic drug:  Travoprost (BAK Free)  INSTILL 1 DROP INTO RIGHT EYE AT BEDTIME     venlafaxine XR 75 MG 24 hr capsule  Commonly known as:  EFFEXOR-XR  TAKE (1) CAPSULE DAILY     Vitamin D3 1000 units Caps  Take 1 capsule by mouth daily.     zolpidem 10 MG tablet  Commonly known as:  AMBIEN  TAKE 1/2 TABLET AT BEDTIME AS NEEDED        Activity: activity as tolerated Diet: low fat, low cholesterol diet Wound Care: none needed  Follow-up:  With Dr. Dalbert Batman in 3 weeks.  Signed: Edsel Petrin. Dalbert Batman, M.D., FACS General and minimally invasive surgery Breast and Colorectal Surgery  07/28/2015, 6:15 AM

## 2015-07-28 NOTE — Progress Notes (Signed)
07/28/15 0945  Reviewed discharge instructions with patient. Patient verbalized understanding of discharge instructions. Copy of discharge instructions and prescription given to patient.

## 2015-07-28 NOTE — Discharge Instructions (Signed)
-  see above 

## 2015-07-30 ENCOUNTER — Other Ambulatory Visit: Payer: Self-pay | Admitting: General Surgery

## 2015-07-30 DIAGNOSIS — Z85850 Personal history of malignant neoplasm of thyroid: Secondary | ICD-10-CM | POA: Diagnosis present

## 2015-07-30 DIAGNOSIS — C73 Malignant neoplasm of thyroid gland: Secondary | ICD-10-CM | POA: Diagnosis present

## 2015-07-30 NOTE — Progress Notes (Signed)
Spoke with Dr Anthony Sar in regards to pts CT chest preformed on 04/15/2015. Dr Al Corpus stated no need for new Chest X Ray prior to surgical date.

## 2015-07-31 ENCOUNTER — Encounter (HOSPITAL_COMMUNITY): Payer: Self-pay | Admitting: *Deleted

## 2015-08-03 DIAGNOSIS — M898X7 Other specified disorders of bone, ankle and foot: Secondary | ICD-10-CM | POA: Diagnosis not present

## 2015-08-03 DIAGNOSIS — M948X7 Other specified disorders of cartilage, ankle and foot: Secondary | ICD-10-CM | POA: Diagnosis not present

## 2015-08-03 DIAGNOSIS — M25572 Pain in left ankle and joints of left foot: Secondary | ICD-10-CM | POA: Diagnosis not present

## 2015-08-04 ENCOUNTER — Ambulatory Visit: Payer: Medicare Other

## 2015-08-05 NOTE — H&P (Signed)
[name]. [name]  Location: [facility] Patient #: [id number] DOB: 30-Jun-1940 Married / Language: English / Race: White Female   History of Present Illness  .  The patient is a 75 year old female who presents with thyroid cancer. This very pleasant 75 year old female originally presented with a thyroid nodule. She had been evaluated for some swallowing and choking prior problems. Evaluation by Radene Journey was negative. Chest x-ray and CT scan showed ascending aortic aneurysm, 4.7 cm size and a large left thyroid nodule. Thyroid ultrasound revealed an additionally 2 nodules on the right, cystic nodule the left but also 2.5 cm solid nodule on the left. Thyroid function test in June 2016 were normal.  Evaluation by Dr. [name] for her ascending aortic aneurysm was performed and he felt she was low risk for rupture is going to see her in 6 months  FNA of the left thyroid nodule showed atypia of uncertain significance, follicular lesion.  She has chronic leg swelling but Dopplers were negative for any evidence of DVT.  She recently underwent left thyroid lobectomy and final pathology showed 1.8 cm follicular variant of papillary thyroid cancer, negative margins. One parathyroid gland was present in the specimen. I discussed this with her and her husband on the phone and again in the office today. I've advise completion thyroidectomy and central compartment node dissection and she is in complete agreement.  She's had no problems since the surgery. Swallowing well. No neck swelling. No voice change.  She is scheduled for completion thyroidectomy and central compartment node dissection this coming Thursday. I discussed the indications, details, techniques, and numerous risk of the surgery with her and her husband. She is aware of the risk of bleeding, infection, temporary or permanent hoarseness, temporary or permanent hypocalcemia. She is aware that she'll need to be taking  thyroid hormone pills for the rest of her life. She is aware this will require regulation. She knows that I will refer her to an endocrinologist to supervise radioiodine ablation and of her thyroid cancer and follow-up. She does not have an endocrinologist and so will make that referral postop. She understands all these issues. All of her questions were answered. She is in full agreement this with this plan.   Allergies  Alendronate Sodium *ENDOCRINE AND METABOLIC AGENTS - MISC.* Levothyroxine Sodium *CHEMICALS* Lipitor *ANTIHYPERLIPIDEMICS* Lyrica *ANTICONVULSANTS* Meloxicam *CHEMICALS* Sibutramine HCl Monohydrate *ADHD/ANTI-NARCOLEPSY/ANTI-OBESITY/ANOREXIANTS* Actonel *ENDOCRINE AND METABOLIC AGENTS - MISC.*  Medication History  CeleBREX (200MG  Capsule, Oral) Active. PriLOSEC (40MG  Capsule DR, Oral) Active. Atenolol (50MG  Tablet, Oral) Active. Simvastatin (80MG  Tablet, Oral) Active. Venlafaxine HCl ER (75MG  Capsule ER 24HR, Oral) Active. Travatan Z (0.004% Solution, Ophthalmic) Active. Ambien (10MG  Tablet, Oral) Active. Lumigan (0.01% Solution, Ophthalmic) Active. Vitamin D (400UNIT Tablet, Oral) Active. Tranxene-T (7.5MG  Tablet, Oral) Active. Flonase (50MCG/ACT Suspension, Nasal) Active. Hydrocortisone (2.5% Cream, External) Active. Multiple Vitamin (Oral) Active. Medications Reconciled  Vitals   Weight: 178 lb Height: 65in Body Surface Area: 1.88 m Body Mass Index: 29.62 kg/m  Temp.: 97.22F  Pulse: 70 (Regular)  BP: 130/70 (Sitting, Left Arm, Standard)       Physical Exam General Mental Status-Alert. General Appearance-Not in acute distress. Build & Nutrition-Well nourished. Posture-Normal posture. Gait-Normal.  Head and Neck Head-normocephalic, atraumatic with no lesions or palpable masses. Trachea-midline. Thyroid Gland Characteristics - normal size and consistency and no palpable nodules. Note: Recent  thyroidectomy scar healing nicely. Tissues soft. No hematoma. No swelling. Voice is strong.   Chest and Lung Exam Chest and lung  exam reveals -on auscultation, normal breath sounds, no adventitious sounds and normal vocal resonance.  Cardiovascular Cardiovascular examination reveals -normal heart sounds, regular rate and rhythm with no murmurs and femoral artery auscultation bilaterally reveals normal pulses, no bruits, no thrills.  Neurologic Neurologic evaluation reveals -alert and oriented x 3 with no impairment of recent or remote memory, normal attention span and ability to concentrate, normal sensation and normal coordination.  Musculoskeletal Normal Exam - Bilateral-Upper Extremity Strength Normal and Lower Extremity Strength Normal.    Assessment & Plan   THYROID CANCER (C73) Impression: Follicular variant of papillary thyroid cancer, left leg, 1.8 cm, negative margins  Schedule for Surgery You are recovering from your left thyroid lobectomy without any obvious surgical complications. Your wound looks fine and your voice is strong The pathology report shows a well differentiated thyroid cancer, and you have been given a copy of the pathology report One parathyroid gland was also noted in the thyroid specimen  My recommendation is that we proceed with a completion thyroidectomy. This means that we remove the right half of the thyroid gland and take Some of the central compartment lymph nodes. It is your desire to go ahead with that surgery, and it is scheduled for this coming Thursday. We have discussed the techniques and risks of this surgery in detail Please read all the printed information that we have given you  ASCENDING AORTIC ANEURYSM (I71.2) LEG SWELLING (M79.89) HISTORY OF CHOLECYSTECTOMY (Z90.49) CHRONIC GERD (K21.9) HISTORY OF INCISIONAL HERNIA REPAIR (Z98.890) DEPRESSION, CONTROLLED (F32.9) HYPERTENSION, BENIGN (I10)   [name], M.D.,  [facility] General and Minimally invasive Surgery Breast and Colorectal Surgery Office:   [phone] Pager:   [phone]

## 2015-08-06 ENCOUNTER — Observation Stay (HOSPITAL_COMMUNITY)
Admission: RE | Admit: 2015-08-06 | Discharge: 2015-08-07 | Disposition: A | Discharge status: Home / Self Care | Payer: Medicare Other | Source: Ambulatory Visit | Attending: General Surgery | Admitting: General Surgery

## 2015-08-06 ENCOUNTER — Encounter (HOSPITAL_COMMUNITY): Payer: Self-pay | Admitting: *Deleted

## 2015-08-06 ENCOUNTER — Ambulatory Visit (HOSPITAL_COMMUNITY): Payer: Medicare Other | Admitting: Anesthesiology

## 2015-08-06 ENCOUNTER — Encounter (HOSPITAL_COMMUNITY)
Admission: RE | Disposition: A | Discharge status: Home / Self Care | Payer: Self-pay | Source: Ambulatory Visit | Attending: General Surgery

## 2015-08-06 DIAGNOSIS — Z79899 Other long term (current) drug therapy: Secondary | ICD-10-CM | POA: Diagnosis not present

## 2015-08-06 DIAGNOSIS — I712 Thoracic aortic aneurysm, without rupture: Secondary | ICD-10-CM | POA: Diagnosis not present

## 2015-08-06 DIAGNOSIS — K219 Gastro-esophageal reflux disease without esophagitis: Secondary | ICD-10-CM | POA: Diagnosis not present

## 2015-08-06 DIAGNOSIS — M1990 Unspecified osteoarthritis, unspecified site: Secondary | ICD-10-CM | POA: Diagnosis not present

## 2015-08-06 DIAGNOSIS — C73 Malignant neoplasm of thyroid gland: Secondary | ICD-10-CM | POA: Diagnosis not present

## 2015-08-06 DIAGNOSIS — I739 Peripheral vascular disease, unspecified: Secondary | ICD-10-CM | POA: Insufficient documentation

## 2015-08-06 DIAGNOSIS — F419 Anxiety disorder, unspecified: Secondary | ICD-10-CM | POA: Diagnosis not present

## 2015-08-06 DIAGNOSIS — K449 Diaphragmatic hernia without obstruction or gangrene: Secondary | ICD-10-CM | POA: Diagnosis not present

## 2015-08-06 DIAGNOSIS — Z85850 Personal history of malignant neoplasm of thyroid: Secondary | ICD-10-CM | POA: Diagnosis present

## 2015-08-06 DIAGNOSIS — F329 Major depressive disorder, single episode, unspecified: Secondary | ICD-10-CM | POA: Diagnosis not present

## 2015-08-06 DIAGNOSIS — I10 Essential (primary) hypertension: Secondary | ICD-10-CM | POA: Diagnosis not present

## 2015-08-06 DIAGNOSIS — H409 Unspecified glaucoma: Secondary | ICD-10-CM | POA: Diagnosis not present

## 2015-08-06 DIAGNOSIS — M797 Fibromyalgia: Secondary | ICD-10-CM | POA: Insufficient documentation

## 2015-08-06 HISTORY — PX: THYROIDECTOMY: SHX17

## 2015-08-06 SURGERY — THYROIDECTOMY
Anesthesia: General | Site: Neck

## 2015-08-06 MED ORDER — SUGAMMADEX SODIUM 500 MG/5ML IV SOLN
INTRAVENOUS | Status: AC
  Filled 2015-08-06: qty 5

## 2015-08-06 MED ORDER — ONDANSETRON HCL 4 MG/2ML IJ SOLN
INTRAMUSCULAR | Status: DC | PRN
  Administered 2015-08-06: 4 mg via INTRAVENOUS

## 2015-08-06 MED ORDER — ONDANSETRON HCL 4 MG/2ML IJ SOLN: 4.0000 mg | Freq: Four times a day (QID) | INTRAMUSCULAR | Status: DC | PRN

## 2015-08-06 MED ORDER — METOCLOPRAMIDE HCL 5 MG/ML IJ SOLN
INTRAMUSCULAR | Status: DC | PRN
  Administered 2015-08-06: 10 mg via INTRAVENOUS

## 2015-08-06 MED ORDER — HYDROCODONE-ACETAMINOPHEN 5-325 MG PO TABS: 1.0000 | ORAL_TABLET | ORAL | Status: DC | PRN

## 2015-08-06 MED ORDER — ATROPINE SULFATE 0.4 MG/ML IJ SOLN
INTRAMUSCULAR | Status: AC
  Filled 2015-08-06: qty 2

## 2015-08-06 MED ORDER — ENOXAPARIN SODIUM 40 MG/0.4ML ~~LOC~~ SOLN: 40.0000 mg | SUBCUTANEOUS | Status: DC

## 2015-08-06 MED ORDER — ONDANSETRON 4 MG PO TBDP: 4.0000 mg | ORAL_TABLET | Freq: Four times a day (QID) | ORAL | Status: DC | PRN

## 2015-08-06 MED ORDER — ATENOLOL 50 MG PO TABS
50.0000 mg | ORAL_TABLET | Freq: Every day | ORAL | Status: DC
  Filled 2015-08-06: qty 1

## 2015-08-06 MED ORDER — PROMETHAZINE HCL 25 MG/ML IJ SOLN: 6.2500 mg | INTRAMUSCULAR | Status: DC | PRN

## 2015-08-06 MED ORDER — CALCIUM CARBONATE 1250 (500 CA) MG PO TABS: 1.0000 | ORAL_TABLET | Freq: Three times a day (TID) | ORAL | Status: DC

## 2015-08-06 MED ORDER — CHLORHEXIDINE GLUCONATE 4 % EX LIQD: 1.0000 "application " | Freq: Once | CUTANEOUS | Status: DC

## 2015-08-06 MED ORDER — HYDROMORPHONE HCL 1 MG/ML IJ SOLN
0.2500 mg | INTRAMUSCULAR | Status: DC | PRN
  Administered 2015-08-06 (×2): 0.5 mg via INTRAVENOUS

## 2015-08-06 MED ORDER — PHENYLEPHRINE HCL 10 MG/ML IJ SOLN
INTRAMUSCULAR | Status: AC
  Filled 2015-08-06: qty 1

## 2015-08-06 MED ORDER — CLORAZEPATE DIPOTASSIUM 7.5 MG PO TABS: 7.5000 mg | ORAL_TABLET | Freq: Every evening | ORAL | Status: DC | PRN

## 2015-08-06 MED ORDER — LABETALOL HCL 5 MG/ML IV SOLN
INTRAVENOUS | Status: AC
  Filled 2015-08-06: qty 4

## 2015-08-06 MED ORDER — FENTANYL CITRATE (PF) 100 MCG/2ML IJ SOLN
INTRAMUSCULAR | Status: DC | PRN
  Administered 2015-08-06 (×3): 50 ug via INTRAVENOUS
  Administered 2015-08-06: 100 ug via INTRAVENOUS

## 2015-08-06 MED ORDER — LIDOCAINE HCL (CARDIAC) 20 MG/ML IV SOLN
INTRAVENOUS | Status: AC
  Filled 2015-08-06: qty 5

## 2015-08-06 MED ORDER — VENLAFAXINE HCL ER 75 MG PO CP24
75.0000 mg | ORAL_CAPSULE | Freq: Every day | ORAL | Status: DC
  Administered 2015-08-07: 75 mg via ORAL
  Filled 2015-08-06 (×2): qty 1

## 2015-08-06 MED ORDER — BUPIVACAINE HCL (PF) 0.5 % IJ SOLN
INTRAMUSCULAR | Status: DC | PRN
  Administered 2015-08-06: 6 mL

## 2015-08-06 MED ORDER — CALCIUM CARBONATE 1250 (500 CA) MG PO TABS
1.0000 | ORAL_TABLET | Freq: Three times a day (TID) | ORAL | Status: DC
  Administered 2015-08-06 – 2015-08-07 (×3): 500 mg via ORAL
  Filled 2015-08-06 (×7): qty 1

## 2015-08-06 MED ORDER — LACTATED RINGERS IV SOLN
INTRAVENOUS | Status: DC
Start: 2015-08-06 — End: 2015-08-06

## 2015-08-06 MED ORDER — BISACODYL 5 MG PO TBEC: 5.0000 mg | DELAYED_RELEASE_TABLET | Freq: Every day | ORAL | Status: DC | PRN

## 2015-08-06 MED ORDER — LABETALOL HCL 5 MG/ML IV SOLN
INTRAVENOUS | Status: DC | PRN
  Administered 2015-08-06 (×2): 2.5 mg via INTRAVENOUS

## 2015-08-06 MED ORDER — HYDROMORPHONE HCL 1 MG/ML IJ SOLN: 1.0000 mg | INTRAMUSCULAR | Status: DC | PRN

## 2015-08-06 MED ORDER — ENOXAPARIN SODIUM 40 MG/0.4ML ~~LOC~~ SOLN
40.0000 mg | SUBCUTANEOUS | Status: DC
  Filled 2015-08-06: qty 0.4

## 2015-08-06 MED ORDER — EPHEDRINE SULFATE 50 MG/ML IJ SOLN
INTRAMUSCULAR | Status: DC | PRN
  Administered 2015-08-06: 5 mg via INTRAVENOUS

## 2015-08-06 MED ORDER — PROPOFOL 10 MG/ML IV BOLUS
INTRAVENOUS | Status: DC | PRN
  Administered 2015-08-06: 150 mg via INTRAVENOUS

## 2015-08-06 MED ORDER — ATORVASTATIN CALCIUM 40 MG PO TABS
40.0000 mg | ORAL_TABLET | Freq: Every day | ORAL | Status: DC
  Administered 2015-08-06: 40 mg via ORAL
  Filled 2015-08-06 (×2): qty 1

## 2015-08-06 MED ORDER — LIDOCAINE HCL 4 % EX SOLN
CUTANEOUS | Status: DC | PRN
  Administered 2015-08-06: 4 mL via TOPICAL

## 2015-08-06 MED ORDER — POLYETHYLENE GLYCOL 3350 17 G PO PACK: 17.0000 g | PACK | Freq: Every day | ORAL | Status: DC | PRN

## 2015-08-06 MED ORDER — ADULT MULTIVITAMIN W/MINERALS CH
1.0000 | ORAL_TABLET | Freq: Every day | ORAL | Status: DC
  Administered 2015-08-06: 1 via ORAL
  Filled 2015-08-06 (×2): qty 1

## 2015-08-06 MED ORDER — HYDROCORTISONE 2.5 % RE CREA
1.0000 "application " | TOPICAL_CREAM | RECTAL | Status: DC
  Administered 2015-08-06: 1 via RECTAL
  Filled 2015-08-06: qty 28.35

## 2015-08-06 MED ORDER — VITAMIN D 1000 UNITS PO TABS
1000.0000 [IU] | ORAL_TABLET | Freq: Every day | ORAL | Status: DC
  Filled 2015-08-06: qty 1

## 2015-08-06 MED ORDER — DEXAMETHASONE SODIUM PHOSPHATE 10 MG/ML IJ SOLN
INTRAMUSCULAR | Status: DC | PRN
  Administered 2015-08-06: 10 mg via INTRAVENOUS

## 2015-08-06 MED ORDER — SENIOR MULTIVITAMIN PLUS PO TABS
ORAL_TABLET | Freq: Every day | ORAL | Status: DC
Start: 2015-08-06 — End: 2015-08-06

## 2015-08-06 MED ORDER — HYDROMORPHONE HCL 1 MG/ML IJ SOLN
INTRAMUSCULAR | Status: AC
  Filled 2015-08-06: qty 1

## 2015-08-06 MED ORDER — LACTATED RINGERS IV SOLN
INTRAVENOUS | Status: DC | PRN
  Administered 2015-08-06 (×2): via INTRAVENOUS

## 2015-08-06 MED ORDER — ONDANSETRON HCL 4 MG/2ML IJ SOLN
INTRAMUSCULAR | Status: AC
  Filled 2015-08-06: qty 2

## 2015-08-06 MED ORDER — LEVOTHYROXINE SODIUM 50 MCG PO TABS
50.0000 ug | ORAL_TABLET | Freq: Every day | ORAL | Status: DC
  Administered 2015-08-07: 50 ug via ORAL
  Filled 2015-08-06 (×2): qty 1

## 2015-08-06 MED ORDER — FLUTICASONE PROPIONATE 50 MCG/ACT NA SUSP
2.0000 | Freq: Every day | NASAL | Status: DC
  Administered 2015-08-06: 2 via NASAL
  Filled 2015-08-06: qty 16

## 2015-08-06 MED ORDER — LEVOTHYROXINE SODIUM 50 MCG PO TABS: 50.0000 ug | ORAL_TABLET | Freq: Every day | ORAL | Status: DC

## 2015-08-06 MED ORDER — CEFAZOLIN SODIUM-DEXTROSE 2-3 GM-% IV SOLR
INTRAVENOUS | Status: AC
  Filled 2015-08-06: qty 50

## 2015-08-06 MED ORDER — SUGAMMADEX SODIUM 500 MG/5ML IV SOLN
INTRAVENOUS | Status: DC | PRN
  Administered 2015-08-06: 350 mg via INTRAVENOUS

## 2015-08-06 MED ORDER — FENTANYL CITRATE (PF) 250 MCG/5ML IJ SOLN
INTRAMUSCULAR | Status: AC
  Filled 2015-08-06: qty 5

## 2015-08-06 MED ORDER — ROCURONIUM BROMIDE 100 MG/10ML IV SOLN
INTRAVENOUS | Status: DC | PRN
  Administered 2015-08-06: 10 mg via INTRAVENOUS
  Administered 2015-08-06: 50 mg via INTRAVENOUS

## 2015-08-06 MED ORDER — NEOSTIGMINE METHYLSULFATE 10 MG/10ML IV SOLN
INTRAVENOUS | Status: AC
  Filled 2015-08-06: qty 1

## 2015-08-06 MED ORDER — PHENYLEPHRINE 40 MCG/ML (10ML) SYRINGE FOR IV PUSH (FOR BLOOD PRESSURE SUPPORT)
PREFILLED_SYRINGE | INTRAVENOUS | Status: AC
  Filled 2015-08-06: qty 10

## 2015-08-06 MED ORDER — BUPIVACAINE HCL (PF) 0.5 % IJ SOLN
INTRAMUSCULAR | Status: AC
  Filled 2015-08-06: qty 30

## 2015-08-06 MED ORDER — EPHEDRINE SULFATE 50 MG/ML IJ SOLN
INTRAMUSCULAR | Status: AC
  Filled 2015-08-06: qty 1

## 2015-08-06 MED ORDER — ZOLPIDEM TARTRATE 5 MG PO TABS
5.0000 mg | ORAL_TABLET | Freq: Every day | ORAL | Status: DC
  Administered 2015-08-06: 5 mg via ORAL
  Filled 2015-08-06: qty 1

## 2015-08-06 MED ORDER — SODIUM CHLORIDE 0.9 % IV SOLN: INTRAVENOUS | Status: DC

## 2015-08-06 MED ORDER — CEFAZOLIN SODIUM-DEXTROSE 2-3 GM-% IV SOLR
2.0000 g | INTRAVENOUS | Status: AC
  Administered 2015-08-06: 2 g via INTRAVENOUS

## 2015-08-06 MED ORDER — HYDROMORPHONE HCL 1 MG/ML IJ SOLN: 0.5000 mg | INTRAMUSCULAR | Status: DC | PRN

## 2015-08-06 MED ORDER — POTASSIUM CHLORIDE IN NACL 20-0.9 MEQ/L-% IV SOLN
INTRAVENOUS | Status: DC
  Administered 2015-08-06: 21:00:00 via INTRAVENOUS
  Filled 2015-08-06 (×3): qty 1000

## 2015-08-06 MED ORDER — LATANOPROST 0.005 % OP SOLN
1.0000 [drp] | Freq: Every day | OPHTHALMIC | Status: DC
  Administered 2015-08-06: 1 [drp] via OPHTHALMIC
  Filled 2015-08-06: qty 2.5

## 2015-08-06 MED ORDER — PANTOPRAZOLE SODIUM 40 MG PO TBEC
40.0000 mg | DELAYED_RELEASE_TABLET | Freq: Every day | ORAL | Status: DC
  Filled 2015-08-06: qty 1

## 2015-08-06 MED ORDER — LIDOCAINE HCL (CARDIAC) 20 MG/ML IV SOLN
INTRAVENOUS | Status: DC | PRN
  Administered 2015-08-06: 50 mg via INTRAVENOUS

## 2015-08-06 MED ORDER — DEXAMETHASONE SODIUM PHOSPHATE 10 MG/ML IJ SOLN
INTRAMUSCULAR | Status: AC
  Filled 2015-08-06: qty 1

## 2015-08-06 SURGICAL SUPPLY — 47 items
ADH SKN CLS APL DERMABOND .7 (GAUZE/BANDAGES/DRESSINGS) ×1
APL SKNCLS STERI-STRIP NONHPOA (GAUZE/BANDAGES/DRESSINGS) ×1
ATTRACTOMAT 16X20 MAGNETIC DRP (DRAPES) ×2 IMPLANT
BENZOIN TINCTURE PRP APPL 2/3 (GAUZE/BANDAGES/DRESSINGS) ×2 IMPLANT
BLADE HEX COATED 2.75 (ELECTRODE) ×2 IMPLANT
BLADE SURG 15 STRL LF DISP TIS (BLADE) ×1 IMPLANT
BLADE SURG 15 STRL SS (BLADE) ×2
CHLORAPREP W/TINT 26ML (MISCELLANEOUS) ×2 IMPLANT
CLIP TI MEDIUM 6 (CLIP) ×5 IMPLANT
CLIP TI WIDE RED SMALL 6 (CLIP) ×5 IMPLANT
COVER SURGICAL LIGHT HANDLE (MISCELLANEOUS) ×2 IMPLANT
DERMABOND ADVANCED (GAUZE/BANDAGES/DRESSINGS) ×1
DERMABOND ADVANCED .7 DNX12 (GAUZE/BANDAGES/DRESSINGS) IMPLANT
DISSECTOR ROUND CHERRY 3/8 STR (MISCELLANEOUS) ×2 IMPLANT
DRAPE LAPAROTOMY T 98X78 PEDS (DRAPES) ×2 IMPLANT
DRAPE POUCH INSTRU U-SHP 10X18 (DRAPES) ×2 IMPLANT
DRESSING SURGICEL FIBRLLR 1X2 (HEMOSTASIS) IMPLANT
DRSG SURGICEL FIBRILLAR 1X2 (HEMOSTASIS) ×2
ELECT PENCIL ROCKER SW 15FT (MISCELLANEOUS) ×2 IMPLANT
ELECT REM PT RETURN 9FT ADLT (ELECTROSURGICAL) ×2
ELECTRODE REM PT RTRN 9FT ADLT (ELECTROSURGICAL) ×1 IMPLANT
GAUZE SPONGE 4X4 12PLY STRL (GAUZE/BANDAGES/DRESSINGS) ×1 IMPLANT
GAUZE SPONGE 4X4 16PLY XRAY LF (GAUZE/BANDAGES/DRESSINGS) ×3 IMPLANT
GLOVE EUDERMIC 7 POWDERFREE (GLOVE) ×1 IMPLANT
GOWN STRL REUS W/TWL XL LVL3 (GOWN DISPOSABLE) ×4 IMPLANT
HEMOSTAT SURGICEL 2X14 (HEMOSTASIS) IMPLANT
KIT BASIN OR (CUSTOM PROCEDURE TRAY) ×2 IMPLANT
LIQUID BAND (GAUZE/BANDAGES/DRESSINGS) ×2 IMPLANT
MANIFOLD NEPTUNE II (INSTRUMENTS) ×2 IMPLANT
NDL HYPO 25X1 1.5 SAFETY (NEEDLE) ×1 IMPLANT
NEEDLE HYPO 25X1 1.5 SAFETY (NEEDLE) ×2 IMPLANT
PACK BASIC VI WITH GOWN DISP (CUSTOM PROCEDURE TRAY) ×2 IMPLANT
SHEARS HARMONIC 9CM CVD (BLADE) ×2 IMPLANT
STAPLER VISISTAT 35W (STAPLE) IMPLANT
STRIP CLOSURE SKIN 1/2X4 (GAUZE/BANDAGES/DRESSINGS) ×1 IMPLANT
SUT MNCRL AB 4-0 PS2 18 (SUTURE) ×2 IMPLANT
SUT SILK 2 0 (SUTURE) ×2
SUT SILK 2-0 18XBRD TIE 12 (SUTURE) IMPLANT
SUT SILK 3 0 (SUTURE) ×2
SUT SILK 3-0 18XBRD TIE 12 (SUTURE) IMPLANT
SUT VIC AB 3-0 54XBRD REEL (SUTURE) IMPLANT
SUT VIC AB 3-0 BRD 54 (SUTURE) ×6
SUT VIC AB 3-0 SH 18 (SUTURE) ×3 IMPLANT
SYR BULB IRRIGATION 50ML (SYRINGE) ×2 IMPLANT
SYR CONTROL 10ML LL (SYRINGE) ×2 IMPLANT
TOWEL OR 17X26 10 PK STRL BLUE (TOWEL DISPOSABLE) ×2 IMPLANT
YANKAUER SUCT BULB TIP 10FT TU (MISCELLANEOUS) ×2 IMPLANT

## 2015-08-06 NOTE — Anesthesia Preprocedure Evaluation (Signed)
Anesthesia Evaluation  Patient identified by MRN, date of birth, ID band Patient awake    Reviewed: Allergy & Precautions, H&P , NPO status , Patient's Chart, lab work & pertinent test results, reviewed documented beta blocker date and time   Airway Mallampati: II  TM Distance: >3 FB Neck ROM: Full    Dental no notable dental hx. (+) Teeth Intact, Missing   Pulmonary neg pulmonary ROS,    Pulmonary exam normal breath sounds clear to auscultation       Cardiovascular hypertension, Pt. on medications and Pt. on home beta blockers + Peripheral Vascular Disease  Normal cardiovascular exam Rhythm:Regular Rate:Normal  9/11 stress test- no ischemia, EF 72%  Ascending aortic dilation at 4.7cm on 03/2015   Neuro/Psych Anxiety Depression  Neuromuscular disease    GI/Hepatic hiatal hernia, GERD  Controlled and Medicated,  Endo/Other    Renal/GU      Musculoskeletal  (+) Arthritis , Fibromyalgia - (celebrex)  Abdominal   Peds  Hematology   Anesthesia Other Findings Glaucoma bilateral  Reproductive/Obstetrics                             Lab Results  Component Value Date   WBC 7.0 07/27/2015   HGB 12.0 07/27/2015   HCT 37.8 07/27/2015   MCV 86.3 07/27/2015   PLT 196 07/27/2015   Lab Results  Component Value Date   CREATININE 0.72 07/27/2015   BUN 16 07/22/2015   NA 144 07/22/2015   K 4.1 07/22/2015   CL 107 07/22/2015   CO2 29 07/22/2015    Anesthesia Physical  Anesthesia Plan  ASA: III  Anesthesia Plan: General   Post-op Pain Management:    Induction: Intravenous  Airway Management Planned: Oral ETT  Additional Equipment:   Intra-op Plan:   Post-operative Plan: Extubation in OR  Informed Consent: I have reviewed the patients History and Physical, chart, labs and discussed the procedure including the risks, benefits and alternatives for the proposed anesthesia with the  patient or authorized representative who has indicated his/her understanding and acceptance.   Dental advisory given  Plan Discussed with: CRNA and Surgeon  Anesthesia Plan Comments:         Anesthesia Quick Evaluation

## 2015-08-06 NOTE — Anesthesia Postprocedure Evaluation (Signed)
Anesthesia Post Note  Patient: [name]  Procedure(s) Performed: Procedure(s) (LRB): RIGHT THYROID LOBECTOMY, REIMPLANTATION PARATHYROID (N/A)  Patient location during evaluation: PACU Anesthesia Type: General Level of consciousness: awake and alert Pain management: pain level controlled Vital Signs Assessment: post-procedure vital signs reviewed and stable Respiratory status: spontaneous breathing Cardiovascular status: blood pressure returned to baseline Anesthetic complications: no    Last Vitals:  Filed Vitals:   08/06/15 1949 08/06/15 2048  BP: 120/59 119/59  Pulse: 75 64  Temp: 36.8 C 36.7 C  Resp: 16 16    Last Pain:  Filed Vitals:   08/06/15 2049  PainSc: [place], Kody Brandl E

## 2015-08-06 NOTE — Transfer of Care (Signed)
Immediate Anesthesia Transfer of Care Note  Patient: [name]  Procedure(s) Performed: Procedure(s): RIGHT THYROID LOBECTOMY, REIMPLANTATION PARATHYROID (N/A)  Patient Location: PACU  Anesthesia Type:General  Level of Consciousness:  sedated, patient cooperative and responds to stimulation  Airway & Oxygen Therapy:Patient Spontanous Breathing and Patient connected to face mask oxgen  Post-op Assessment:  Report given to PACU RN and Post -op Vital signs reviewed and stable  Post vital signs:  Reviewed and stable  Last Vitals:  Filed Vitals:   08/06/15 1528 08/06/15 1530  BP: 173/78 168/79  Pulse: 87 87  Temp: 36.8 C   Resp: 20 19    Complications: No apparent anesthesia complications

## 2015-08-06 NOTE — Anesthesia Procedure Notes (Signed)
Procedure Name: Intubation Date/Time: 08/06/2015 1:54 PM Performed by: Arvid Marengo, Virgel Gess Pre-anesthesia Checklist: Patient identified, Emergency Drugs available, Suction available, Patient being monitored and Timeout performed Patient Re-evaluated:Patient Re-evaluated prior to inductionOxygen Delivery Method: Circle system utilized Preoxygenation: Pre-oxygenation with 100% oxygen Intubation Type: IV induction Ventilation: Mask ventilation without difficulty Laryngoscope Size: Mac and 4 Grade View: Grade I Tube type: Oral Tube size: 7.0 mm Number of attempts: 1 Airway Equipment and Method: Stylet Placement Confirmation: ETT inserted through vocal cords under direct vision,  positive ETCO2,  CO2 detector and breath sounds checked- equal and bilateral Secured at: 21 cm Tube secured with: Tape Dental Injury: Teeth and Oropharynx as per pre-operative assessment

## 2015-08-06 NOTE — Op Note (Signed)
Patient Name:           [name]   Date of Surgery:        08/06/2015  Pre op Diagnosis:      Follicular variant of papillary thyroid cancer  Post op Diagnosis:    Same  Procedure:                 Completion right thyroid lobectomy, reimplantation inferior parathyroid into right sternocleidomastoid muscle  Surgeon:                     Edsel Petrin. Dalbert Batman, M.D., FACS  Assistant:                      Armandina Gemma, M.D.  Operative Indications:   The patient is a 75 year old female who presents with thyroid cancer. This very pleasant 75 year old female originally presented with a thyroid nodule. She had been evaluated for some swallowing and choking prior problems. Evaluation by Radene Journey was negative. Chest x-ray and CT scan showed ascending aortic aneurysm, 4.7 cm size and a large left thyroid nodule. Thyroid ultrasound revealed an additionally 2 nodules on the right, cystic nodule the left but also 2.5 cm solid nodule on the left. Thyroid function test in June 2016 were normal.      Evaluation by Dr. [name] for her ascending aortic aneurysm was performed and he felt she was low risk for rupture is going to see her in 6 months      FNA of the left thyroid nodule showed atypia of uncertain significance, follicular lesion.She recently underwent left thyroid lobectomy and final pathology showed 1.8 cm follicular variant of papillary thyroid cancer, negative margins. One parathyroid gland was present in the specimen. I discussed this with her and her husband on the phone and again in the office today. I've advise completion thyroidectomy and central compartment node dissection and she is in complete agreement.      She's had no problems since the surgery. Swallowing well. No neck swelling. No voice change.      She is scheduled for completion thyroidectomy and central compartment node dissection this coming Thursday. I discussed the indications, details, techniques, and numerous risk  of the surgery with her and her husband. She is aware of the risk of bleeding, infection, temporary or permanent hoarseness, temporary or permanent hypocalcemia. She is aware that she'll need to be taking thyroid hormone pills for the rest of her life. She is aware this will require regulation. She knows that I will refer her to an endocrinologist to supervise radioiodine ablation and of her thyroid cancer and follow-up. She does not have an endocrinologist and so will make that referral postop.  Operative Findings:       The right thyroid lobe was normal sized.  The right recurrent laryngeal nerve was identified and preserved.  I felt a couple of tiny nodules in the right thyroid lobe.  The right superior parathyroid gland was identified and preserved.  The right inferior parathyroid gland appeared to come directly off of the lateral thyroid capsule, had a very excellent arterial blood supply, had to be removed from the thyroid gland and reimplanted into the right sternocleidomastoid muscle.  A metal clip was placed in the Vicryl suture closing the sterno- cleidomastoid muscle to mark this for future reference if necessary.  There was very little tissue remaining in the central compartment.  There was a little bit of  fatty tissue at the suprasternal notch but the subclavian artery bowed up into the suprasternal notch and so we felt it was too high risk to dissect around the artery for any nodes.  We felt no other palpable mass in this area.  Procedure in Detail:          Following the induction of general endotracheal anesthesia the patient's was positioned with a roll behind her shoulders and her neck extended.  She was placed in a reverse and elevated position.  The neck was prepped and draped in a sterile fashion.  Surgical timeout was performed.  Intravenous antibiotics were given.  0.5% Marcaine was used as a local infiltration anesthetic.  I reopened the transverse incision.  I divided into All of  the Vicryl sutures from the platysma.  I placed a self-retaining retractor to hold the skin flaps superiorly and in fairly.  The Vicryl sutures holding the strap muscles together were cut and set muscles were slowly dissected away from the underlying tissues.  I then dissected the strap muscles off of the right thyroid lobe.  I freed up the lower pole of the right thyroid lobe by dividing lateral attachments.  Smaller venous structures were controlled with metal clips and harmonic scalpel.  I then dissected out the superior pole of structures very carefully.  I took the superior pole vessels and structures in several small steps staying right on the thyroid gland.  The superior parathyroid was identified and preserved.  Slowly dissected from lateral to medial.  The recurrent laryngeal nerve was identified easily and preserved.  I had to mobilize his very carefully because the ligament of Gwenlyn Found was very tight this was eventually a post.  Smaller structures and vessels were divided with the harmonic scalpel.  I dissected the right thyroid lobe up off of the trachea and sent to the lab with appropriate history attach.     As mentioned above I felt that the inferior parathyroid gland the right came directly off of the thyroid capsule.  It had normal texture and color for a parathyroid but was small.  I cut this into about 5 pieces.  I then went laterally in the neck and identified the sternocleidomastoid muscle.  Using a hemostat I spread the muscle and made a nice pocket and put the parathyroid pieces in there and then closed the muscle on top.  I closed the muscle with a figure-of-eight suture of 3-0 Vicryl and I placed a metal clip in the Vicryl suture to mark that for future reference if necessary.     The wound was irrigated with saline.  Hemostasis looked good.  I put several small pieces of Fibrillar  hemostatic sponge in the wound.  There was no bleeding.  Strap muscles were closed with interrupted sutures of  3-0 Vicryl.  Platysma muscle was closed with interrupted sutures of 3-0 Vicryl.  The skin was closed with a running subcuticular 4-0 Monocryl and Dermabond.  The patient was taken to PACU in stable condition.  EBL 30-40 mL.  Counts correct.  Complications none.     Edsel Petrin. Dalbert Batman, M.D., FACS General and Minimally Invasive Surgery Breast and Colorectal Surgery  08/06/2015 3:30 PM

## 2015-08-07 DIAGNOSIS — C73 Malignant neoplasm of thyroid gland: Secondary | ICD-10-CM | POA: Diagnosis not present

## 2015-08-07 LAB — BASIC METABOLIC PANEL
ANION GAP: 10 (ref 5–15)
BUN: 13 mg/dL (ref 6–20)
CHLORIDE: 104 mmol/L (ref 101–111)
CO2: 26 mmol/L (ref 22–32)
Calcium: 9 mg/dL (ref 8.9–10.3)
Creatinine, Ser: 0.65 mg/dL (ref 0.44–1.00)
GFR calc Af Amer: >60 mL/min (ref >60)
GFR calc non Af Amer: >60 mL/min (ref >60)
GLUCOSE: 111 mg/dL — AB (ref 65–99)
POTASSIUM: 4.2 mmol/L (ref 3.5–5.1)
SODIUM: 140 mmol/L (ref 135–145)

## 2015-08-07 MED ORDER — LEVOTHYROXINE SODIUM 50 MCG PO TABS: 50.0000 ug | ORAL_TABLET | Freq: Every day | ORAL | Status: DC

## 2015-08-07 MED ORDER — HYDROCODONE-ACETAMINOPHEN 5-325 MG PO TABS: 1.0000 | ORAL_TABLET | ORAL | Status: DC | PRN

## 2015-08-07 MED ORDER — CALCIUM CARBONATE 1250 (500 CA) MG PO TABS: 2.0000 | ORAL_TABLET | Freq: Three times a day (TID) | ORAL | Status: DC

## 2015-08-07 NOTE — Progress Notes (Signed)
Doctor Dalbert Batman in to see patient this morning...informed that calcium level will be checked first this morning and the dayshift nurse is to call the physicians office with results. Discharge orders will be given pending the lab results and M.D orders. RX given from M.D. And placed to chart sleeve.

## 2015-08-07 NOTE — Discharge Instructions (Signed)
(  see above)  You will need blood work for a serum calcium drawn before your office visit next week.  Dr. Darrel Hoover nurse Shirlean Mylar will arrange for this when she discusses this with you

## 2015-08-07 NOTE — Discharge Summary (Addendum)
Patient ID: [name] [id number] 75 y.o. 05-27-1941  Admit date: 08/06/2015  Discharge date and time: 08/07/2015  Admitting Physician: [name]  Discharge Physician: [name]  Admission Diagnoses: thyroid cancer  Discharge Diagnoses: Follicular variant of papillary thyroid cancer, 1.8 cm                                         Ascending aortic aneurysm                                         Chronic leg swelling with negative duplex scan for DVT                                          History of incisional hernia repair                                          Depression, controlled                                          Hypertension, benign                                          History of cholecystectomy  Operations: Procedure(s): RIGHT THYROID LOBECTOMY, REIMPLANTATION PARATHYROID  Admission Condition: good  Discharged Condition: good . Indication for Admission: The patient is a 75 year old female who presents with thyroid cancer. This very pleasant 75 year old female originally presented with a thyroid nodule. She had been evaluated for some swallowing and choking prior problems. Evaluation by Radene Journey was negative. Chest x-ray and CT scan showed ascending aortic aneurysm, 4.7 cm size and a large left thyroid nodule. Thyroid ultrasound revealed an additionally 2 nodules on the right, cystic nodule the left but also 2.5 cm solid nodule on the left. Thyroid function test in June 2016 were normal.  Evaluation by Dr. [name] for her ascending aortic aneurysm was performed and he felt she was low risk for rupture is going to see her in 6 months  FNA of the left thyroid nodule showed atypia of uncertain significance, follicular lesion.She recently underwent left thyroid lobectomy and final pathology showed 1.8 cm follicular variant of papillary thyroid cancer, negative margins. One parathyroid gland was present in the specimen. I discussed  this with her and her husband on the phone and again in the office today. I've advise completion thyroidectomy and central compartment node dissection and she is in complete agreement.  She's had no problems since the surgery. Swallowing well. No neck swelling. No voice change.  She is scheduled for completion thyroidectomy and possible central compartment node dissection this coming Thursday. I discussed the indications, details, techniques, and numerous risk of the surgery with her and her husband. She is aware of the risk of bleeding, infection, temporary or permanent hoarseness, temporary or permanent hypocalcemia. She is aware that she'll need to  be taking thyroid hormone pills for the rest of her life. She is aware this will require regulation. She knows that I will refer her to an endocrinologist to supervise radioiodine ablation and of her thyroid cancer and follow-up. She does not have an endocrinologist and so will make that referral postop.  Hospital Course: On the day of admission the patient was taken to the operating room.  She was reexplored through her collar incision.  She underwent right thyroid lobectomy and reimplantation of the inferior parathyroid into the right sternocleidomastoid muscle marked by a clip.     Operative findings were that The right thyroid lobe was normal sized. The right recurrent laryngeal nerve was identified and preserved. I felt a couple of tiny nodules in the right thyroid lobe. The right superior parathyroid gland was identified and preserved. The right inferior parathyroid gland appeared to come directly off of the lateral thyroid capsule, had a very excellent arterial blood supply, had to be removed from the thyroid gland and reimplanted into the right sternocleidomastoid muscle. A metal clip was placed in the Vicryl suture closing the sterno- cleidomastoid muscle to mark this for future reference if necessary. There was very little tissue  remaining in the central compartment. There was a little bit of fatty tissue at the suprasternal notch but the subclavian artery bowed up into the suprasternal notch and so we felt it was too high risk to dissect around the artery for any nodes. We felt no other palpable mass in this area.     Postoperatively the patient did well.  Her voice remains strong.  Pain was well controlled.  She had no paresthesias.  She had no swallowing problems.  She felt ready to go home on postop day 1.  At this time her final pathology report is pending.  At this time her serum calcium level is pending.  Examination this morning reveals that the patient's voice is strong.  [place] sign is negative bilaterally.  She denies paresthesias.  She denies muscle cramps.  The wound is soft without hematoma or swelling.      She was advised that we will check her calcium level before she goes home to be sure that it is not abnormally low.  She was given a prescription for Os-Cal 3 tablets 3 times a day temporarily.  Alternatively I told her that she could take 3 extra strength Tums 3 times a day to save a little money, and this would hopefully be temporary until her calcium level demonstrate stability.  She was also given a prescription for  Synthroid 50 g a day and Norco temporarily for pain.  She was told to continue her usual medications.  She was asked to return to see me in the office in one week and we'll check a serum calcium at that time.  She knows that once we get the final pathology back she will be referred to an endocrinologist for discussion and management and oversight of her radioiodine ablation and ultimate regulation of her thyroid hormone supplementation needs.  I will continue to follow her as an outpatient.  ADDENDUM:  Calcium level this morning returned at 9.0.  Normal.  I will check this in one week and if it remains normal we will taper her off calcium supplementations thereafter.  Consults:  None  Significant Diagnostic Studies: Surgical pathology, pending.  Serum calcium, pending  Treatments: surgery: Right thyroid completion lobectomy, reimplantation parathyroid into right sterno clot a mastoid muscle  Disposition: Home  Patient Instructions:    Medication List    TAKE these medications        aspirin 81 MG tablet  Take 1 tablet (81 mg total) by mouth every other day.     atenolol 50 MG tablet  Commonly known as:  TENORMIN  TAKE 1 TABLET DAILY     calcium carbonate 1250 (500 Ca) MG tablet  Commonly known as:  OS-CAL - dosed in mg of elemental calcium  Take 2 tablets (1,000 mg of elemental calcium total) by mouth 3 (three) times daily with meals.     celecoxib 200 MG capsule  Commonly known as:  CELEBREX  Take 1 capsule (200 mg total) by mouth daily.     clorazepate 7.5 MG tablet  Commonly known as:  TRANXENE  TAKE 1 TABLET DAILY AS NEEDED     FISH OIL PO  Take 1 capsule by mouth daily.     fluticasone 50 MCG/ACT nasal spray  Commonly known as:  FLONASE  Place 2 sprays into the nose daily.     HYDROcodone-acetaminophen 5-325 MG tablet  Commonly known as:  NORCO  Take 1-2 tablets by mouth every 6 (six) hours as needed.     HYDROcodone-acetaminophen 5-325 MG tablet  Commonly known as:  NORCO/VICODIN  Take 1-2 tablets by mouth every 4 (four) hours as needed for moderate pain.     hydrocortisone 2.5 % rectal cream  Commonly known as:  PROCTOZONE-HC  APPLY TO RECTALLY 2 TIMES A DAY AS NEEDED     ibuprofen 200 MG tablet  Commonly known as:  ADVIL,MOTRIN  Take 200 mg by mouth every 6 (six) hours as needed.     levothyroxine 50 MCG tablet  Commonly known as:  SYNTHROID, LEVOTHROID  Take 1 tablet (50 mcg total) by mouth daily before breakfast.     omeprazole 20 MG capsule  Commonly known as:  PRILOSEC  Take 20 mg by mouth 2 (two) times daily.     SENIOR MULTIVITAMIN PLUS PO  Take by mouth daily.     simvastatin 80 MG tablet  Commonly known as:   ZOCOR  TAKE ONE TABLET AT BEDTIME     TRAVATAN Z 0.004 % Soln ophthalmic solution  Generic drug:  Travoprost (BAK Free)  INSTILL 1 DROP INTO RIGHT EYE AT BEDTIME     venlafaxine XR 75 MG 24 hr capsule  Commonly known as:  EFFEXOR-XR  TAKE (1) CAPSULE DAILY     Vitamin D3 1000 units Caps  Take 1 capsule by mouth daily.     zolpidem 10 MG tablet  Commonly known as:  AMBIEN  TAKE 1/2 TABLET AT BEDTIME AS NEEDED        Activity: activity as tolerated Diet: low fat, low cholesterol diet Wound Care: none needed  Follow-up:  With Dr. Dalbert Batman in 1 week.  Signed: Edsel Petrin. Dalbert Batman, M.D., FACS General and minimally invasive surgery Breast and Colorectal Surgery  08/07/2015, 6:13 AM

## 2015-08-09 LAB — CALCIUM, IONIZED: CALCIUM, IONIZED, SERUM: 5 mg/dL (ref 4.5–5.6)

## 2015-08-10 ENCOUNTER — Other Ambulatory Visit: Payer: Medicare Other

## 2015-08-10 DIAGNOSIS — C73 Malignant neoplasm of thyroid gland: Secondary | ICD-10-CM

## 2015-08-10 NOTE — Progress Notes (Signed)
Lab only 

## 2015-08-11 ENCOUNTER — Ambulatory Visit (INDEPENDENT_AMBULATORY_CARE_PROVIDER_SITE_OTHER): Payer: Medicare Other | Admitting: *Deleted

## 2015-08-11 DIAGNOSIS — E538 Deficiency of other specified B group vitamins: Secondary | ICD-10-CM

## 2015-08-11 LAB — CALCIUM: Calcium: 7.4 mg/dL — ABNORMAL LOW (ref 8.7–10.3)

## 2015-08-11 NOTE — Progress Notes (Signed)
Pt given B12 injection IM right deltoid and tolerated well. 

## 2015-08-12 ENCOUNTER — Other Ambulatory Visit (INDEPENDENT_AMBULATORY_CARE_PROVIDER_SITE_OTHER): Payer: Medicare Other

## 2015-08-12 LAB — CALCIUM: Calcium: 7.6 mg/dL — ABNORMAL LOW (ref 8.7–10.3)

## 2015-08-14 ENCOUNTER — Other Ambulatory Visit: Payer: Medicare Other

## 2015-08-14 DIAGNOSIS — C73 Malignant neoplasm of thyroid gland: Secondary | ICD-10-CM | POA: Diagnosis not present

## 2015-08-14 NOTE — Progress Notes (Signed)
Lab only 

## 2015-08-15 LAB — CALCIUM: Calcium: 8.2 mg/dL — ABNORMAL LOW (ref 8.7–10.3)

## 2015-08-18 ENCOUNTER — Other Ambulatory Visit: Payer: Medicare Other

## 2015-08-18 DIAGNOSIS — I10 Essential (primary) hypertension: Secondary | ICD-10-CM | POA: Diagnosis not present

## 2015-08-18 DIAGNOSIS — E538 Deficiency of other specified B group vitamins: Secondary | ICD-10-CM

## 2015-08-18 DIAGNOSIS — E785 Hyperlipidemia, unspecified: Secondary | ICD-10-CM | POA: Diagnosis not present

## 2015-08-18 DIAGNOSIS — K219 Gastro-esophageal reflux disease without esophagitis: Secondary | ICD-10-CM

## 2015-08-18 DIAGNOSIS — E559 Vitamin D deficiency, unspecified: Secondary | ICD-10-CM | POA: Diagnosis not present

## 2015-08-18 DIAGNOSIS — E042 Nontoxic multinodular goiter: Secondary | ICD-10-CM

## 2015-08-18 NOTE — Progress Notes (Signed)
Lab only 

## 2015-08-19 LAB — CBC WITH DIFFERENTIAL/PLATELET
BASOS: 1 %
Basophils Absolute: 0 10*3/uL (ref 0.0–0.2)
EOS (ABSOLUTE): 0.1 10*3/uL (ref 0.0–0.4)
EOS: 2 %
HEMATOCRIT: 36.7 % (ref 34.0–46.6)
Hemoglobin: 11.8 g/dL (ref 11.1–15.9)
IMMATURE GRANS (ABS): 0 10*3/uL (ref 0.0–0.1)
IMMATURE GRANULOCYTES: 0 %
LYMPHS: 36 %
Lymphocytes Absolute: 2.1 10*3/uL (ref 0.7–3.1)
MCH: 27.2 pg (ref 26.6–33.0)
MCHC: 32.2 g/dL (ref 31.5–35.7)
MCV: 85 fL (ref 79–97)
MONOS ABS: 0.4 10*3/uL (ref 0.1–0.9)
Monocytes: 7 %
NEUTROS ABS: 3.3 10*3/uL (ref 1.4–7.0)
NEUTROS PCT: 54 %
Platelets: 271 10*3/uL (ref 150–379)
RBC: 4.34 x10E6/uL (ref 3.77–5.28)
RDW: 15 % (ref 12.3–15.4)
WBC: 5.9 10*3/uL (ref 3.4–10.8)

## 2015-08-19 LAB — BMP8+EGFR
BUN/Creatinine Ratio: 13 (ref 11–26)
BUN: 11 mg/dL (ref 8–27)
CO2: 26 mmol/L (ref 18–29)
CREATININE: 0.86 mg/dL (ref 0.57–1.00)
Calcium: 9.1 mg/dL (ref 8.7–10.3)
Chloride: 99 mmol/L (ref 96–106)
GFR, EST AFRICAN AMERICAN: 78 mL/min/{1.73_m2} (ref >59)
GFR, EST NON AFRICAN AMERICAN: 67 mL/min/{1.73_m2} (ref >59)
Glucose: 105 mg/dL — ABNORMAL HIGH (ref 65–99)
POTASSIUM: 4.2 mmol/L (ref 3.5–5.2)
SODIUM: 141 mmol/L (ref 134–144)

## 2015-08-19 LAB — HEPATIC FUNCTION PANEL
ALT: 12 IU/L (ref 0–32)
AST: 19 IU/L (ref 0–40)
Albumin: 4.3 g/dL (ref 3.5–4.8)
Alkaline Phosphatase: 90 IU/L (ref 39–117)
BILIRUBIN TOTAL: 0.5 mg/dL (ref 0.0–1.2)
BILIRUBIN, DIRECT: 0.16 mg/dL (ref 0.00–0.40)
Total Protein: 6.3 g/dL (ref 6.0–8.5)

## 2015-08-19 LAB — NMR, LIPOPROFILE
Cholesterol: 202 mg/dL — ABNORMAL HIGH (ref 100–199)
HDL Cholesterol by NMR: 72 mg/dL (ref >39)
HDL PARTICLE NUMBER: 43.9 umol/L (ref >=30.5)
LDL Particle Number: 1274 nmol/L — ABNORMAL HIGH (ref <1000)
LDL SIZE: 21 nm (ref >20.5)
LDL-C: 113 mg/dL — ABNORMAL HIGH (ref 0–99)
LP-IR SCORE: 30 (ref <=45)
SMALL LDL PARTICLE NUMBER: 529 nmol/L — AB (ref <=527)
Triglycerides by NMR: 87 mg/dL (ref 0–149)

## 2015-08-19 LAB — VITAMIN D 25 HYDROXY (VIT D DEFICIENCY, FRACTURES): VIT D 25 HYDROXY: 79.9 ng/mL (ref 30.0–100.0)

## 2015-08-20 ENCOUNTER — Encounter: Payer: Self-pay | Admitting: Family Medicine

## 2015-08-20 ENCOUNTER — Ambulatory Visit (INDEPENDENT_AMBULATORY_CARE_PROVIDER_SITE_OTHER): Payer: Medicare Other | Admitting: Family Medicine

## 2015-08-20 VITALS — BP 100/66 | HR 59 | Temp 97.2°F | Ht 65.0 in | Wt 176.8 lb

## 2015-08-20 DIAGNOSIS — E785 Hyperlipidemia, unspecified: Secondary | ICD-10-CM

## 2015-08-20 DIAGNOSIS — F329 Major depressive disorder, single episode, unspecified: Secondary | ICD-10-CM

## 2015-08-20 DIAGNOSIS — F32A Depression, unspecified: Secondary | ICD-10-CM

## 2015-08-20 DIAGNOSIS — H409 Unspecified glaucoma: Secondary | ICD-10-CM | POA: Diagnosis not present

## 2015-08-20 DIAGNOSIS — I712 Thoracic aortic aneurysm, without rupture: Secondary | ICD-10-CM

## 2015-08-20 DIAGNOSIS — M797 Fibromyalgia: Secondary | ICD-10-CM | POA: Diagnosis not present

## 2015-08-20 DIAGNOSIS — C73 Malignant neoplasm of thyroid gland: Secondary | ICD-10-CM | POA: Diagnosis not present

## 2015-08-20 DIAGNOSIS — I10 Essential (primary) hypertension: Secondary | ICD-10-CM

## 2015-08-20 DIAGNOSIS — I7121 Aneurysm of the ascending aorta, without rupture: Secondary | ICD-10-CM | POA: Insufficient documentation

## 2015-08-20 NOTE — Patient Instructions (Addendum)
Continue current medications. Continue good therapeutic lifestyle changes which include good diet and exercise. Fall precautions discussed with patient. If an FOBT was given today- please return it to our front desk. If you are over 73 years old - you may need Prevnar 41 or the adult Pneumonia vaccine.  Flu Shots will be available at our office starting mid- September. Please call and schedule a FLU CLINIC APPOINTMENT.                        Medicare Annual Wellness Visit  [facility] and the medical providers at [facility] strive to bring you the best medical care.  In doing so we not only want to address your current medical conditions and concerns but also to detect new conditions early and prevent illness, disease and health-related problems.    Medicare offers a yearly Wellness Visit which allows our clinical staff to assess your need for preventative services including immunizations, lifestyle education, counseling to decrease risk of preventable diseases and screening for fall risk and other medical concerns.    This visit is provided free of charge (no copay) for all Medicare recipients. The clinical pharmacists at [facility] have begun to conduct these Wellness Visits which will also include a thorough review of all your medications.    As you primary medical provider recommend that you make an appointment for your Annual Wellness Visit if you have not done so already this year.  You may set up this appointment before you leave today or you may call back [phone]) and schedule an appointment.  Please make sure when you call that you mention that you are scheduling your Annual Wellness Visit with the clinical pharmacist so that the appointment may be made for the proper length of time.   The patient should follow-up with the surgeon as planned tomorrow I and with the other specialists that she has an appointment with arranged by the  surgeon She should gradually increase her activity and try to get more walking in She should confirm with the surgeon the dose of the vitamin D that he will start to be on

## 2015-08-20 NOTE — Progress Notes (Signed)
Subjective:    Patient ID: [name], female    DOB: December 04, 1940, 75 y.o.   MRN: [id number]  HPI Patient is here today for a 4 month rck. Patient has no other complaints today. Patient has had recent thyroid surgery for thyroid cancer. She is taking her medication regularly at the present time she decided she is taking vitamin D 50,000 units once daily and she will confirm with the surgeon when she visits him tomorrow if that's the dose he will start a stay on or not. She will take her recent lab work which was reviewed with her to the surgeon so that he can see this. The serum calcium was good on this and her vitamin D level was in the 70s. She denies any chest pain or shortness of breath. She is not having any indigestion nausea vomiting diarrhea or blood in the stool. She is passing her water without problems. Her energy level is somewhat diminished following these most recent surgeries.   Review of Systems  Constitutional: Negative.   HENT: Negative.   Eyes: Negative.   Respiratory: Negative.   Cardiovascular: Negative.   Gastrointestinal: Negative.   Endocrine: Negative.   Genitourinary: Negative.   Musculoskeletal: Negative.   Skin: Negative.   Allergic/Immunologic: Negative.   Neurological: Negative.   Hematological: Negative.   Psychiatric/Behavioral: Negative.         Patient Active Problem List   Diagnosis Date Noted  . Thyroid cancer ([place]) 07/30/2015  . Left thyroid nodule 07/27/2015  . Osteoporosis with fracture 04/16/2014  . At high risk for falls 04/16/2014  . Proximal humerus fracture 05/30/2011  . CHEST PAIN, PRECORDIAL 02/26/2010  . HYPERLIPIDEMIA 02/23/2010  . DEPRESSION 02/23/2010  . GLAUCOMA 02/23/2010  . CATARACTS 02/23/2010  . RHEUMATIC FEVER 02/23/2010  . HYPERTENSION 02/23/2010  . MITRAL VALVE PROLAPSE 02/23/2010  . GERD 02/23/2010  . OSTEOARTHRITIS 02/23/2010  . FIBROMYALGIA 02/23/2010   Outpatient Encounter Prescriptions as of 08/20/2015    Medication Sig  . aspirin 81 MG tablet Take 1 tablet (81 mg total) by mouth every other day.  Marland Kitchen atenolol (TENORMIN) 50 MG tablet TAKE 1 TABLET DAILY  . calcium carbonate (OS-CAL - DOSED IN MG OF ELEMENTAL CALCIUM) 1250 (500 Ca) MG tablet Take 2 tablets (1,000 mg of elemental calcium total) by mouth 3 (three) times daily with meals.  . celecoxib (CELEBREX) 200 MG capsule Take 1 capsule (200 mg total) by mouth daily.  . Cholecalciferol (VITAMIN D3) 1000 UNITS CAPS Take 1 capsule by mouth daily.    . clorazepate (TRANXENE) 7.5 MG tablet TAKE 1 TABLET DAILY AS NEEDED (Patient taking differently: TAKE 1 TABLET DAILY AS NEEDED FOR ANXIETY)  . fluticasone (FLONASE) 50 MCG/ACT nasal spray Place 2 sprays into the nose daily. (Patient taking differently: Place 2 sprays into the nose daily as needed for allergies. )  . HYDROcodone-acetaminophen (NORCO) 5-325 MG tablet Take 1-2 tablets by mouth every 6 (six) hours as needed.  Marland Kitchen HYDROcodone-acetaminophen (NORCO/VICODIN) 5-325 MG tablet Take 1-2 tablets by mouth every 4 (four) hours as needed for moderate pain.  . hydrocortisone (PROCTOZONE-HC) 2.5 % rectal cream APPLY TO RECTALLY 2 TIMES A DAY AS NEEDED (Patient taking differently: Place 1 application rectally every other day. )  . ibuprofen (ADVIL,MOTRIN) 200 MG tablet Take 200 mg by mouth every 6 (six) hours as needed.  Marland Kitchen levothyroxine (SYNTHROID, LEVOTHROID) 50 MCG tablet Take 1 tablet (50 mcg total) by mouth daily before breakfast.  . Multiple Vitamins-Minerals (SENIOR MULTIVITAMIN  PLUS PO) Take by mouth daily.    . Omega-3 Fatty Acids (FISH OIL PO) Take 1 capsule by mouth daily.  Marland Kitchen omeprazole (PRILOSEC) 20 MG capsule Take 20 mg by mouth 2 (two) times daily.  . simvastatin (ZOCOR) 80 MG tablet TAKE ONE TABLET AT BEDTIME  . TRAVATAN Z 0.004 % SOLN ophthalmic solution INSTILL 1 DROP INTO RIGHT EYE AT BEDTIME  . venlafaxine XR (EFFEXOR-XR) 75 MG 24 hr capsule TAKE (1) CAPSULE DAILY  . zolpidem (AMBIEN) 10  MG tablet TAKE 1/2 TABLET AT BEDTIME AS NEEDED (Patient taking differently: TAKE 1/2 TABLET AT BEDTIME)   Facility-Administered Encounter Medications as of 08/20/2015  Medication  . cyanocobalamin ((VITAMIN B-12)) injection 1,000 mcg       Objective:   Physical Exam  Constitutional: She is oriented to person, place, and time. She appears well-developed and well-nourished. No distress.  HENT:  Head: Normocephalic and atraumatic.  Right Ear: External ear normal.  Left Ear: External ear normal.  Nose: Nose normal.  Mouth/Throat: Oropharynx is clear and moist. No oropharyngeal exudate.  Eyes: Conjunctivae and EOM are normal. Pupils are equal, round, and reactive to light. Right eye exhibits no discharge. Left eye exhibits no discharge. No scleral icterus.  Neck: Normal range of motion. Neck supple. No thyromegaly present.  The incision on her neck is healing well following the surgery.  Cardiovascular: Normal rate, regular rhythm and normal heart sounds.   No murmur heard. The heart has a regular rate and rhythm  Pulmonary/Chest: Effort normal and breath sounds normal. No respiratory distress. She has no wheezes. She has no rales. She exhibits no tenderness.  Clear anteriorly and posteriorly  Abdominal: Soft. Bowel sounds are normal. She exhibits no mass. There is no tenderness. There is no rebound and no guarding.  Slight tenderness over the gallbladder scar  Musculoskeletal: Normal range of motion. She exhibits no edema.  Lymphadenopathy:    She has no cervical adenopathy.  Neurological: She is alert and oriented to person, place, and time. She has normal reflexes. No cranial nerve deficit.  Skin: Skin is warm and dry. No rash noted.  Psychiatric: She has a normal mood and affect. Her behavior is normal. Judgment and thought content normal.  Nursing note and vitals reviewed.  BP 100/66 mmHg  Pulse 59  Temp(Src) 97.2 F (36.2 C) (Oral)  Ht 5\' 5"  (1.651 m)  Wt 176 lb 12.8 oz  (80.196 kg)  BMI 29.42 kg/m2        Assessment & Plan:  1. Thyroid cancer ([place]) -Follow up with surgeon as planned - Calcium - TSH  2. Essential hypertension -Continue current blood pressure medication  3. GLAUCOMA -Continue to follow up with ophthalmology as planned  4. Hyperlipidemia -The total LDL particle number was slightly elevated and she should continue with aggressive therapeutic lifestyle changes and simvastatin as tolerated  5. Primary fibromyalgia syndrome -Fibromyalgia has been less bothersome recently  6. Depression -She seemed to be upbeat and had no complaints with her depression today.  7. Ascending aortic aneurysm -The patient should follow-up with the vascular surgeon, Dr. Lawson Fiscal as planned   The patient was also complaining with some heartburn are burning in her throat. She was asked to start some Zantac twice daily before breakfast and supper and to switch her Celebrex after supper instead of bedtime  Patient Instructions  Continue current medications. Continue good therapeutic lifestyle changes which include good diet and exercise. Fall precautions discussed with patient. If an FOBT was given  today- please return it to our front desk. If you are over 73 years old - you may need Prevnar 12 or the adult Pneumonia vaccine.  Flu Shots will be available at our office starting mid- September. Please call and schedule a FLU CLINIC APPOINTMENT.                        Medicare Annual Wellness Visit  [facility] and the medical providers at [facility] strive to bring you the best medical care.  In doing so we not only want to address your current medical conditions and concerns but also to detect new conditions early and prevent illness, disease and health-related problems.    Medicare offers a yearly Wellness Visit which allows our clinical staff to assess your need for preventative services including immunizations, lifestyle  education, counseling to decrease risk of preventable diseases and screening for fall risk and other medical concerns.    This visit is provided free of charge (no copay) for all Medicare recipients. The clinical pharmacists at [facility] have begun to conduct these Wellness Visits which will also include a thorough review of all your medications.    As you primary medical provider recommend that you make an appointment for your Annual Wellness Visit if you have not done so already this year.  You may set up this appointment before you leave today or you may call back [phone] and schedule an appointment.  Please make sure when you call that you mention that you are scheduling your Annual Wellness Visit with the clinical pharmacist so that the appointment may be made for the proper length of time.   The patient should follow-up with the surgeon as planned tomorrow I and with the other specialists that she has an appointment with arranged by the surgeon She should gradually increase her activity and try to get more walking in She should confirm with the surgeon the dose of the vitamin D that he will start to be on   Arrie Senate MD

## 2015-08-21 LAB — TSH: TSH: 16.74 u[IU]/mL — ABNORMAL HIGH (ref 0.450–4.500)

## 2015-08-21 LAB — CALCIUM: CALCIUM: 8.4 mg/dL — AB (ref 8.7–10.3)

## 2015-08-21 NOTE — Progress Notes (Signed)
Quick Note:  Inform patient of Lab report,. The TSH is high so she needs more synthroid. Call in new dose for synthroid 75 mcg/day. Remind her to take this in the morning on empty stomach and not to take calcium for 3 hours thereafter. She still needs to continue to take four tums four times a day however.. Repeat serum calcium level next week before her OV next week.  hmi ______

## 2015-08-24 ENCOUNTER — Other Ambulatory Visit: Payer: Medicare Other

## 2015-08-24 DIAGNOSIS — C73 Malignant neoplasm of thyroid gland: Secondary | ICD-10-CM

## 2015-08-25 LAB — CALCIUM: CALCIUM: 9.3 mg/dL (ref 8.7–10.3)

## 2015-08-26 ENCOUNTER — Ambulatory Visit
Admission: RE | Admit: 2015-08-26 | Discharge: 2015-08-26 | Disposition: A | Discharge status: Home / Self Care | Payer: Medicare Other | Source: Ambulatory Visit

## 2015-08-26 DIAGNOSIS — Z1231 Encounter for screening mammogram for malignant neoplasm of breast: Secondary | ICD-10-CM | POA: Diagnosis not present

## 2015-09-04 DIAGNOSIS — H40013 Open angle with borderline findings, low risk, bilateral: Secondary | ICD-10-CM | POA: Diagnosis not present

## 2015-09-07 ENCOUNTER — Other Ambulatory Visit: Payer: Medicare Other

## 2015-09-09 ENCOUNTER — Ambulatory Visit: Payer: Medicare Other

## 2015-09-11 ENCOUNTER — Ambulatory Visit (INDEPENDENT_AMBULATORY_CARE_PROVIDER_SITE_OTHER): Payer: Medicare Other | Admitting: *Deleted

## 2015-09-11 DIAGNOSIS — E538 Deficiency of other specified B group vitamins: Secondary | ICD-10-CM

## 2015-09-11 NOTE — Patient Instructions (Signed)

## 2015-09-11 NOTE — Progress Notes (Signed)
Vitamin b12 injection given and tolerated well.  

## 2015-09-14 DIAGNOSIS — C73 Malignant neoplasm of thyroid gland: Secondary | ICD-10-CM | POA: Diagnosis not present

## 2015-09-14 DIAGNOSIS — E890 Postprocedural hypothyroidism: Secondary | ICD-10-CM | POA: Diagnosis not present

## 2015-09-14 DIAGNOSIS — H401121 Primary open-angle glaucoma, left eye, mild stage: Secondary | ICD-10-CM | POA: Diagnosis not present

## 2015-09-16 ENCOUNTER — Other Ambulatory Visit: Payer: Self-pay | Admitting: *Deleted

## 2015-09-16 MED ORDER — OSELTAMIVIR PHOSPHATE 75 MG PO CAPS: 75.0000 mg | ORAL_CAPSULE | Freq: Every day | ORAL | Status: DC

## 2015-09-17 ENCOUNTER — Other Ambulatory Visit: Payer: Self-pay | Admitting: *Deleted

## 2015-09-17 ENCOUNTER — Other Ambulatory Visit: Payer: Medicare Other

## 2015-09-17 DIAGNOSIS — I359 Nonrheumatic aortic valve disorder, unspecified: Secondary | ICD-10-CM

## 2015-09-17 DIAGNOSIS — R5382 Chronic fatigue, unspecified: Secondary | ICD-10-CM

## 2015-09-17 DIAGNOSIS — I10 Essential (primary) hypertension: Secondary | ICD-10-CM

## 2015-09-17 DIAGNOSIS — I712 Thoracic aortic aneurysm, without rupture, unspecified: Secondary | ICD-10-CM

## 2015-09-17 DIAGNOSIS — C73 Malignant neoplasm of thyroid gland: Secondary | ICD-10-CM | POA: Diagnosis not present

## 2015-09-17 LAB — BMP8+EGFR
BUN / CREAT RATIO: 15 (ref 11–26)
BUN: 11 mg/dL (ref 8–27)
CALCIUM: 8.4 mg/dL — AB (ref 8.7–10.3)
CO2: 25 mmol/L (ref 18–29)
Chloride: 102 mmol/L (ref 96–106)
Creatinine, Ser: 0.72 mg/dL (ref 0.57–1.00)
GFR, EST AFRICAN AMERICAN: 96 mL/min/{1.73_m2} (ref >59)
GFR, EST NON AFRICAN AMERICAN: 83 mL/min/{1.73_m2} (ref >59)
Glucose: 93 mg/dL (ref 65–99)
POTASSIUM: 3.8 mmol/L (ref 3.5–5.2)
Sodium: 144 mmol/L (ref 134–144)

## 2015-09-17 LAB — CBC WITH DIFFERENTIAL/PLATELET
BASOS: 1 %
Basophils Absolute: 0 10*3/uL (ref 0.0–0.2)
EOS (ABSOLUTE): 0.2 10*3/uL (ref 0.0–0.4)
EOS: 4 %
HEMATOCRIT: 33.3 % — AB (ref 34.0–46.6)
Hemoglobin: 11 g/dL — ABNORMAL LOW (ref 11.1–15.9)
IMMATURE GRANULOCYTES: 0 %
Immature Grans (Abs): 0 10*3/uL (ref 0.0–0.1)
LYMPHS ABS: 1.6 10*3/uL (ref 0.7–3.1)
Lymphs: 38 %
MCH: 27.4 pg (ref 26.6–33.0)
MCHC: 33 g/dL (ref 31.5–35.7)
MCV: 83 fL (ref 79–97)
MONOS ABS: 0.4 10*3/uL (ref 0.1–0.9)
Monocytes: 9 %
NEUTROS ABS: 2.1 10*3/uL (ref 1.4–7.0)
NEUTROS PCT: 48 %
Platelets: 201 10*3/uL (ref 150–379)
RBC: 4.02 x10E6/uL (ref 3.77–5.28)
RDW: 16.5 % — AB (ref 12.3–15.4)
WBC: 4.3 10*3/uL (ref 3.4–10.8)

## 2015-09-24 ENCOUNTER — Other Ambulatory Visit: Payer: Self-pay | Admitting: Internal Medicine

## 2015-09-24 DIAGNOSIS — C73 Malignant neoplasm of thyroid gland: Secondary | ICD-10-CM

## 2015-09-29 ENCOUNTER — Ambulatory Visit
Admission: RE | Admit: 2015-09-29 | Discharge: 2015-09-29 | Disposition: A | Discharge status: Home / Self Care | Payer: Medicare Other | Source: Ambulatory Visit | Attending: Internal Medicine | Admitting: Internal Medicine

## 2015-09-29 DIAGNOSIS — C73 Malignant neoplasm of thyroid gland: Secondary | ICD-10-CM | POA: Diagnosis not present

## 2015-09-30 ENCOUNTER — Encounter: Payer: Self-pay | Admitting: Family Medicine

## 2015-09-30 ENCOUNTER — Ambulatory Visit (INDEPENDENT_AMBULATORY_CARE_PROVIDER_SITE_OTHER): Payer: Medicare Other | Admitting: Family Medicine

## 2015-09-30 ENCOUNTER — Other Ambulatory Visit: Payer: Self-pay | Admitting: Family Medicine

## 2015-09-30 ENCOUNTER — Encounter (INDEPENDENT_AMBULATORY_CARE_PROVIDER_SITE_OTHER): Payer: Self-pay

## 2015-09-30 VITALS — BP 136/81 | HR 69 | Temp 97.1°F | Ht 65.0 in | Wt 177.8 lb

## 2015-09-30 DIAGNOSIS — R21 Rash and other nonspecific skin eruption: Secondary | ICD-10-CM | POA: Diagnosis not present

## 2015-09-30 DIAGNOSIS — W57XXXA Bitten or stung by nonvenomous insect and other nonvenomous arthropods, initial encounter: Secondary | ICD-10-CM | POA: Diagnosis not present

## 2015-09-30 MED ORDER — DOXYCYCLINE HYCLATE 100 MG PO TABS: 100.0000 mg | ORAL_TABLET | Freq: Two times a day (BID) | ORAL | Status: DC

## 2015-09-30 NOTE — Progress Notes (Signed)
BP 136/81 mmHg  Pulse 69  Temp(Src) 97.1 F (36.2 C) (Oral)  Ht 5\' 5"  (1.651 m)  Wt 177 lb 12.8 oz (80.65 kg)  BMI 29.59 kg/m2   Subjective:    Patient ID: [name], female    DOB: 10/11/1940, 75 y.o.   MRN: [id number]  HPI: [name] is a 75 y.o. female presenting on 09/30/2015 for Tick bites   HPI Patient has 3 tick bites Patient found 3 ticks in her body one on left thigh and one on left upper arm and one on the right side of her back. The areas around them are red and sore after being able to remove the Tenex. She said they were very difficult to remove or not fully engorged as of yet. She thinks they may have been from the bark that they were using and that the tics were in there. She denies any fevers or chills or drainage from the sites. She denies any joint aches or indigestion or stomach complaints.  Relevant past medical, surgical, family and social history reviewed and updated as indicated. Interim medical history since our last visit reviewed. Allergies and medications reviewed and updated.  Review of Systems  Constitutional: Negative for fever and chills.  HENT: Negative for congestion, ear discharge and ear pain.   Eyes: Negative for redness and visual disturbance.  Respiratory: Negative for chest tightness and shortness of breath.   Cardiovascular: Negative for chest pain and leg swelling.  Genitourinary: Negative for dysuria and difficulty urinating.  Musculoskeletal: Negative for back pain and gait problem.  Skin: Positive for color change and rash.  Neurological: Negative for light-headedness and headaches.  Psychiatric/Behavioral: Negative for behavioral problems and agitation.  All other systems reviewed and are negative.   Per HPI unless specifically indicated above     Medication List       This list is accurate as of: 09/30/15 10:35 AM.  Always use your most recent med list.               aspirin 81 MG tablet  Take 1 tablet (81 mg  total) by mouth every other day.     atenolol 50 MG tablet  Commonly known as:  TENORMIN  TAKE 1 TABLET DAILY     calcium carbonate 1250 (500 Ca) MG tablet  Commonly known as:  OS-CAL - dosed in mg of elemental calcium  Take 2 tablets (1,000 mg of elemental calcium total) by mouth 3 (three) times daily with meals.     celecoxib 200 MG capsule  Commonly known as:  CELEBREX  Take 1 capsule (200 mg total) by mouth daily.     clorazepate 7.5 MG tablet  Commonly known as:  TRANXENE  TAKE 1 TABLET DAILY AS NEEDED     doxycycline 100 MG tablet  Commonly known as:  VIBRA-TABS  Take 1 tablet (100 mg total) by mouth 2 (two) times daily. 1 po bid     FISH OIL PO  Take 1 capsule by mouth daily.     fluticasone 50 MCG/ACT nasal spray  Commonly known as:  FLONASE  Place 2 sprays into the nose daily.     HYDROcodone-acetaminophen 5-325 MG tablet  Commonly known as:  NORCO  Take 1-2 tablets by mouth every 6 (six) hours as needed.     HYDROcodone-acetaminophen 5-325 MG tablet  Commonly known as:  NORCO/VICODIN  Take 1-2 tablets by mouth every 4 (four) hours as needed for moderate pain.  hydrocortisone 2.5 % rectal cream  Commonly known as:  PROCTOZONE-HC  APPLY TO RECTALLY 2 TIMES A DAY AS NEEDED     ibuprofen 200 MG tablet  Commonly known as:  ADVIL,MOTRIN  Take 200 mg by mouth every 6 (six) hours as needed.     levothyroxine 50 MCG tablet  Commonly known as:  SYNTHROID, LEVOTHROID  Take 1 tablet (50 mcg total) by mouth daily before breakfast.     omeprazole 20 MG capsule  Commonly known as:  PRILOSEC  Take 20 mg by mouth 2 (two) times daily.     SENIOR MULTIVITAMIN PLUS PO  Take by mouth daily.     simvastatin 80 MG tablet  Commonly known as:  ZOCOR  TAKE ONE TABLET AT BEDTIME     TRAVATAN Z 0.004 % Soln ophthalmic solution  Generic drug:  Travoprost (BAK Free)  INSTILL 1 DROP INTO RIGHT EYE AT BEDTIME     venlafaxine XR 75 MG 24 hr capsule  Commonly known as:   EFFEXOR-XR  TAKE (1) CAPSULE DAILY     Vitamin D3 1000 units Caps  Take 1 capsule by mouth daily.     zolpidem 10 MG tablet  Commonly known as:  AMBIEN  TAKE 1/2 TABLET AT BEDTIME AS NEEDED           Objective:    BP 136/81 mmHg  Pulse 69  Temp(Src) 97.1 F (36.2 C) (Oral)  Ht 5\' 5"  (1.651 m)  Wt 177 lb 12.8 oz (80.65 kg)  BMI 29.59 kg/m2  Wt Readings from Last 3 Encounters:  09/30/15 177 lb 12.8 oz (80.65 kg)  08/20/15 176 lb 12.8 oz (80.196 kg)  08/06/15 173 lb 2 oz (78.529 kg)    Physical Exam  Constitutional: She is oriented to person, place, and time. She appears well-developed and well-nourished. No distress.  Eyes: Conjunctivae and EOM are normal. Pupils are equal, round, and reactive to light.  Cardiovascular: Normal rate, regular rhythm, normal heart sounds and intact distal pulses.   No murmur heard. Pulmonary/Chest: Effort normal and breath sounds normal. No respiratory distress. She has no wheezes.  Musculoskeletal: Normal range of motion. She exhibits no edema or tenderness.  Neurological: She is alert and oriented to person, place, and time. Coordination normal.  Skin: Skin is warm and dry. Lesion (3 small 0.2 cm areas of erythema that are raised and have a small amount of induration in the center. No drainage noted or warmth) noted. No rash noted. She is not diaphoretic.  Psychiatric: She has a normal mood and affect. Her behavior is normal.  Nursing note and vitals reviewed.     Assessment & Plan:   Problem List Items Addressed This Visit    None    Visit Diagnoses    Tick bite    -  Primary    3 tick bites, one anterior left leg, second posterior left arm, third right lumbar region, send doxycycline       Follow up plan: Return if symptoms worsen or fail to improve.  Counseling provided for all of the vaccine components No orders of the defined types were placed in this encounter.    Caryl Pina, MD [facility] 09/30/2015, 10:35 AM

## 2015-10-13 ENCOUNTER — Other Ambulatory Visit: Payer: Medicare Other

## 2015-10-13 ENCOUNTER — Ambulatory Visit (INDEPENDENT_AMBULATORY_CARE_PROVIDER_SITE_OTHER): Payer: Medicare Other | Admitting: *Deleted

## 2015-10-13 DIAGNOSIS — E538 Deficiency of other specified B group vitamins: Secondary | ICD-10-CM | POA: Diagnosis not present

## 2015-10-13 DIAGNOSIS — C73 Malignant neoplasm of thyroid gland: Secondary | ICD-10-CM | POA: Diagnosis not present

## 2015-10-13 NOTE — Progress Notes (Signed)
Pt given B12 injection IM right deltoid and tolerated well. 

## 2015-10-13 NOTE — Patient Instructions (Signed)

## 2015-10-15 ENCOUNTER — Other Ambulatory Visit: Payer: Self-pay | Admitting: Family Medicine

## 2015-10-21 ENCOUNTER — Encounter: Payer: Self-pay | Admitting: Cardiothoracic Surgery

## 2015-10-21 ENCOUNTER — Ambulatory Visit (INDEPENDENT_AMBULATORY_CARE_PROVIDER_SITE_OTHER): Payer: Medicare Other | Admitting: Cardiothoracic Surgery

## 2015-10-21 ENCOUNTER — Ambulatory Visit (HOSPITAL_COMMUNITY)
Admission: RE | Admit: 2015-10-21 | Discharge: 2015-10-21 | Disposition: A | Discharge status: Home / Self Care | Payer: Medicare Other | Source: Ambulatory Visit | Attending: Cardiothoracic Surgery | Admitting: Cardiothoracic Surgery

## 2015-10-21 ENCOUNTER — Other Ambulatory Visit: Payer: Self-pay | Admitting: Family Medicine

## 2015-10-21 ENCOUNTER — Ambulatory Visit
Admission: RE | Admit: 2015-10-21 | Discharge: 2015-10-21 | Disposition: A | Discharge status: Home / Self Care | Payer: Medicare Other | Source: Ambulatory Visit | Attending: Cardiothoracic Surgery | Admitting: Cardiothoracic Surgery

## 2015-10-21 VITALS — BP 149/68 | HR 62 | Resp 16 | Ht 65.0 in | Wt 177.0 lb

## 2015-10-21 DIAGNOSIS — I712 Thoracic aortic aneurysm, without rupture, unspecified: Secondary | ICD-10-CM

## 2015-10-21 DIAGNOSIS — E785 Hyperlipidemia, unspecified: Secondary | ICD-10-CM | POA: Insufficient documentation

## 2015-10-21 DIAGNOSIS — I77810 Thoracic aortic ectasia: Secondary | ICD-10-CM | POA: Insufficient documentation

## 2015-10-21 DIAGNOSIS — I359 Nonrheumatic aortic valve disorder, unspecified: Secondary | ICD-10-CM | POA: Diagnosis not present

## 2015-10-21 DIAGNOSIS — I119 Hypertensive heart disease without heart failure: Secondary | ICD-10-CM | POA: Diagnosis not present

## 2015-10-21 DIAGNOSIS — I351 Nonrheumatic aortic (valve) insufficiency: Secondary | ICD-10-CM | POA: Insufficient documentation

## 2015-10-21 MED ORDER — IOPAMIDOL (ISOVUE-370) INJECTION 76%
75.0000 mL | Freq: Once | INTRAVENOUS | Status: AC | PRN
  Administered 2015-10-21: 75 mL via INTRAVENOUS

## 2015-10-21 NOTE — Progress Notes (Signed)
PCP is Redge Gainer, MD Referring Provider is Chipper Herb, MD  Chief Complaint  Patient presents with  . Follow-up    6 month with CTA CHEST for surveillance of TAA...had ECHO TODAY    [id number] returns for routine followup of a known asymptomatic fusiform ascending thoracic aneurysm measuring 4.7 cm in diameter.CT scan performed earlier this week shows no change in the size of her ascending fusiform aneurysm and no evidence of change in morphology--no evidence of penetrating ulcer hematoma et Ronney Asters.  Since her visit last year she has undergone a total thyroidectomy by Dr. Dalbert Batman for papillary carcinoma. She's had no chest discomfort or complaints. Recently the patient also had an echocardiogram for review her aortic valve. This shows moderate dilatation of the ascending aorta and mild aortic insufficiency with normal LV systolic function. I reviewed both her CT scan and echocardiogram and counseled the patient regarding their findings. I reassured her that her aortic aneurysm is stable and should not increase in size as well as her blood pressures well managed. I reassured her that her aortic valve is functioning adequately and should not be expected to need surgery  Past Medical History  Diagnosis Date  . Helicobacter pylori (H. pylori)   . Hiatal hernia   . Other and unspecified hyperlipidemia   . Fibromyalgia   . Osteoarthritis   . Hyperlipidemia   . Hypertension   . ACL tear     right  Dr. Gladstone Pih   . Glaucoma     (SE) Dr. Arnoldo Morale   . Arthritis   . Post-menopausal   . Depression   . B12 deficiency   . Tremor   . DUB (dysfunctional uterine bleeding) 10/96  . Insomnia   . Constipation   . ETD (eustachian tube dysfunction)   . MVP (mitral valve prolapse)   . Adenomatous polyp   . Osteopenia   . Cataracts, both eyes 10/2006  . Anxiety   . Glaucoma     bilaterally  . Osteoporosis   . Ascending aortic aneurysm ([place])     note per chart per Dr Lucianne Lei Tright 4.7 cm  04/15/2015   . Leg wound, left     healed   . Occasional tremors     left arm   . History of bronchitis   . History of frequent urinary tract infections   . GERD (gastroesophageal reflux disease)   . Fall   . History of left shoulder fracture     pt states fell off bed and broke ball in shoulder had rod placed   . Thyroid nodule     Past Surgical History  Procedure Laterality Date  . Right knee replacement  3/11    Dr. Gladstone Pih  . Rt. eye cataract  05/28/07  . Dilation and curettage of uterus  03/31/95    Dr. Ovid Curd   . Cholecystectomy  1979  . Ventral hernia repair  2/99  . Bunionectomy  08/1999    right - Dr. Irving Shows   . Orif humerus fracture  05/30/2011    Procedure: OPEN REDUCTION INTERNAL FIXATION (ORIF) PROXIMAL HUMERUS FRACTURE;  Surgeon: Augustin Schooling;  Location: [facility];  Service: Orthopedics;  Laterality: Left;  open reduction internal fixation of proximal humerus fracture  . Eye surgery      also had left cataract removed   . Appendectomy    . Fracture surgery    . Thyroid lobectomy Left 07/27/2015    Procedure: LEFT THYROID LOBECTOMY;  Surgeon: Fanny Skates, MD;  Location: [facility];  Service: General;  Laterality: Left;  . Thyroidectomy N/A 08/06/2015    Procedure: RIGHT THYROID LOBECTOMY, REIMPLANTATION PARATHYROID;  Surgeon: Fanny Skates, MD;  Location: [facility];  Service: General;  Laterality: N/A;    Family History  Problem Relation Age of Onset  . Cancer Mother     PANCREATIC   . Osteoporosis Mother   . Hip fracture Mother   . Heart attack Father 63    Died 68  . Heart disease Father   . Arthritis Father   . Hypertension Sister   . Cancer Sister     skin  . Hypertension Sister     Social History Social History  Substance Use Topics  . Smoking status: Never Smoker   . Smokeless tobacco: Never Used  . Alcohol Use: No    Current Outpatient Prescriptions  Medication Sig Dispense Refill  . aspirin 81 MG tablet Take 1 tablet (81 mg total) by mouth  every other day. 30 tablet   . atenolol (TENORMIN) 50 MG tablet TAKE 1 TABLET DAILY 30 tablet 3  . calcium carbonate (OS-CAL - DOSED IN MG OF ELEMENTAL CALCIUM) 1250 (500 Ca) MG tablet Take 2 tablets (1,000 mg of elemental calcium total) by mouth 3 (three) times daily with meals. 50 tablet 2  . celecoxib (CELEBREX) 200 MG capsule Take 1 capsule (200 mg total) by mouth daily. 30 capsule 3  . clorazepate (TRANXENE) 7.5 MG tablet TAKE 1 TABLET DAILY AS NEEDED (Patient taking differently: TAKE 1 TABLET DAILY AS NEEDED FOR ANXIETY) 30 tablet 1  . fluticasone (FLONASE) 50 MCG/ACT nasal spray Place 2 sprays into the nose daily. (Patient taking differently: Place 2 sprays into the nose daily as needed for allergies. ) 16 g 6  . HYDROcodone-acetaminophen (NORCO/VICODIN) 5-325 MG tablet Take 1-2 tablets by mouth every 4 (four) hours as needed for moderate pain. 30 tablet 0  . hydrocortisone (PROCTOZONE-HC) 2.5 % rectal cream APPLY TO RECTALLY 2 TIMES A DAY AS NEEDED (Patient taking differently: Place 1 application rectally every other day. ) 30 g 3  . ibuprofen (ADVIL,MOTRIN) 200 MG tablet Take 200 mg by mouth every 6 (six) hours as needed.    Marland Kitchen levothyroxine (SYNTHROID, LEVOTHROID) 75 MCG tablet Take 75 mcg by mouth daily before breakfast.    . Multiple Vitamins-Minerals (SENIOR MULTIVITAMIN PLUS PO) Take by mouth daily.      . Omega-3 Fatty Acids (FISH OIL PO) Take 1 capsule by mouth daily.    Marland Kitchen omeprazole (PRILOSEC) 20 MG capsule Take 20 mg by mouth 2 (two) times daily.    . simvastatin (ZOCOR) 80 MG tablet TAKE ONE TABLET AT BEDTIME 30 tablet 4  . TRAVATAN Z 0.004 % SOLN ophthalmic solution INSTILL 1 DROP INTO RIGHT EYE AT BEDTIME 2.5 mL 2  . venlafaxine XR (EFFEXOR-XR) 75 MG 24 hr capsule TAKE (1) CAPSULE DAILY 90 capsule 0  . Vitamin D, Ergocalciferol, (DRISDOL) 50000 units CAPS capsule Take 50,000 Units by mouth every 7 (seven) days.    Marland Kitchen zolpidem (AMBIEN) 10 MG tablet TAKE 1/2 TABLET AT BEDTIME AS  NEEDED (Patient taking differently: TAKE 1/2 TABLET AT BEDTIME) 30 tablet 2   Current Facility-Administered Medications  Medication Dose Route Frequency Provider Last Rate Last Dose  . cyanocobalamin ((VITAMIN B-12)) injection 1,000 mcg  1,000 mcg Intramuscular Q30 days Chipper Herb, MD   1,000 mcg at 10/13/15 1059    Allergies  Allergen Reactions  . Alendronate Sodium Nausea Only  . Latex  Other (See Comments)    RA to latex 1982   . Levofloxacin Nausea And Vomiting  . Lipitor [Atorvastatin Calcium] Other (See Comments)    myalgias  . Lyrica [Pregabalin] Other (See Comments)    Reaction unknown  . Meloxicam     Vaginal itching   . Sibutramine Hcl Monohydrate Other (See Comments)    Reaction unknown  . Actonel [Risedronate] Other (See Comments)    Bone pain and nausea     Review of Systems         Review of Systems :  [ y ] = yes, [  ] = no        General :  Weight gain [   ]    Weight loss  [   ]  Fatigue [  ]  Fever [  ]  Chills  [  ]                                Weakness  [  ]           HEENT    Headache [  ]  Dizziness [  ]  Blurred vision [  ] Glaucoma  [  ]                          Nosebleeds [  ] Painful or loose teeth [  ]        Cardiac :  Chest pain/ pressure [  ]  Resting SOB [  ] exertional SOB [  ]                        Orthopnea [  ]  Pedal edema  [  ]  Palpitations [  ] Syncope/presyncope [ ]                         Paroxysmal nocturnal dyspnea [  ]         Pulmonary : cough [  ]  wheezing [  ]  Hemoptysis [  ] Sputum [  ] Snoring [  ]                              Pneumothorax [  ]  Sleep apnea [  ]        GI : Vomiting [  ]  Dysphagia [  ]  Melena  [  ]  Abdominal pain [  ] BRBPR [  ]              Heart burn [  ]  Constipation [  ] Diarrhea  [  ] Colonoscopy [   ]        GU : Hematuria [  ]  Dysuria [  ]  Nocturia [  ] UTI's [  ]        Vascular : Claudication [  ]  Rest pain [  ]  DVT [  ] Vein stripping [  ] leg ulcers [  ]                           TIA [  ] Stroke [  ]  Varicose veins [  ]  NEURO :  Headaches  [  ] Seizures [  ] Vision changes [  ] Paresthesias [  ]                                       Seizures [  ]        Musculoskeletal :  Arthritis [  ] Gout  [  ]  Back pain [  ]  Joint pain [  ]        Skin :  Rash [  ]  Melanoma [  ] Sores [yes had sustained bites earlier this spring  ]        Heme : Bleeding problems [  ]Clotting Disorders [  ] Anemia [  ]Blood Transfusion [ ]         Endocrine : Diabetes [  ] Heat or Cold intolerance [  ] Polyuria [  ]excessive thirst [ ] currently complaining of taking 15 TUMS per day to maintain her calcium level        Psych : Depression [  ]  Anxiety [  ]  Psych hospitalizations [  ] Memory change [  ]                                                BP 149/68 mmHg  Pulse 62  Resp 16  Ht 5\' 5"  (1.651 m)  Wt 177 lb (80.287 kg)  BMI 29.45 kg/m2  SpO2 98% Physical Exam      Physical Exam  General: overweight 75 year old Caucasian female no acute distress HEENT: Normocephalic pupils equal , dentition adequate Neck: Supple without JVD, adenopathy, or bruit Chest: Clear to auscultation, symmetrical breath sounds, no rhonchi, no tenderness             or deformity Cardiovascular: Regular rate and rhythm, no murmur, no gallop, peripheral pulses             palpable in all extremities Abdomen:  Soft, nontender, no palpable mass or organomegaly Extremities: Warm, well-perfused, no clubbing cyanosis edema or tenderness,              no venous stasis changes of the legs Rectal/GU: Deferred Neuro: Grossly non--focal and symmetrical throughout Skin: Clean and dry without rash or ulceration   Diagnostic Tests: CT scan of chest and echocardiogram purse reviewed in consultation.  Impression: No change in the moderate ascending thoracic aneurysm. No indication for surgery until the diameter reaches 5.5 cm. I feel it is appropriate to continue to follow the ascending aorta  with annual CT scans which is soft we'll arrange. She understands the importance of blood pressure control to prevent further enlargement of her thoracic aorta.  Plan:return in one year with CTA of thoracic aorta.   Len Childs, MD [facility] [phone]

## 2015-10-21 NOTE — Progress Notes (Signed)
  Echocardiogram 2D Echocardiogram has been performed.  Bobbye Charleston 10/21/2015, 1:46 PM

## 2015-10-27 ENCOUNTER — Encounter: Payer: Self-pay | Admitting: *Deleted

## 2015-10-28 DIAGNOSIS — H40033 Anatomical narrow angle, bilateral: Secondary | ICD-10-CM | POA: Diagnosis not present

## 2015-10-28 DIAGNOSIS — H1013 Acute atopic conjunctivitis, bilateral: Secondary | ICD-10-CM | POA: Diagnosis not present

## 2015-11-02 DIAGNOSIS — C73 Malignant neoplasm of thyroid gland: Secondary | ICD-10-CM | POA: Diagnosis not present

## 2015-11-02 DIAGNOSIS — E892 Postprocedural hypoparathyroidism: Secondary | ICD-10-CM | POA: Diagnosis not present

## 2015-11-13 ENCOUNTER — Ambulatory Visit (INDEPENDENT_AMBULATORY_CARE_PROVIDER_SITE_OTHER): Payer: Medicare Other | Admitting: *Deleted

## 2015-11-13 DIAGNOSIS — E538 Deficiency of other specified B group vitamins: Secondary | ICD-10-CM | POA: Diagnosis not present

## 2015-11-13 NOTE — Patient Instructions (Signed)

## 2015-11-13 NOTE — Progress Notes (Signed)
Pt given B12 injection IM right deltoid and tolerated well. 

## 2015-11-17 ENCOUNTER — Other Ambulatory Visit: Payer: Self-pay | Admitting: Family Medicine

## 2015-11-27 DIAGNOSIS — C73 Malignant neoplasm of thyroid gland: Secondary | ICD-10-CM | POA: Diagnosis not present

## 2015-11-27 DIAGNOSIS — E890 Postprocedural hypothyroidism: Secondary | ICD-10-CM | POA: Diagnosis not present

## 2015-12-08 ENCOUNTER — Other Ambulatory Visit: Payer: Self-pay | Admitting: Family

## 2015-12-08 NOTE — Telephone Encounter (Signed)
Last seen 09/30/15  DWM   If approved route to nurse to call into [facility]

## 2015-12-15 ENCOUNTER — Other Ambulatory Visit: Payer: Medicare Other

## 2015-12-15 ENCOUNTER — Ambulatory Visit (INDEPENDENT_AMBULATORY_CARE_PROVIDER_SITE_OTHER): Payer: Medicare Other

## 2015-12-15 DIAGNOSIS — E785 Hyperlipidemia, unspecified: Secondary | ICD-10-CM

## 2015-12-15 DIAGNOSIS — E538 Deficiency of other specified B group vitamins: Secondary | ICD-10-CM | POA: Diagnosis not present

## 2015-12-15 DIAGNOSIS — C73 Malignant neoplasm of thyroid gland: Secondary | ICD-10-CM

## 2015-12-15 DIAGNOSIS — I10 Essential (primary) hypertension: Secondary | ICD-10-CM | POA: Diagnosis not present

## 2015-12-15 DIAGNOSIS — E559 Vitamin D deficiency, unspecified: Secondary | ICD-10-CM

## 2015-12-15 DIAGNOSIS — M25511 Pain in right shoulder: Secondary | ICD-10-CM | POA: Diagnosis not present

## 2015-12-15 DIAGNOSIS — M25512 Pain in left shoulder: Secondary | ICD-10-CM | POA: Diagnosis not present

## 2015-12-16 LAB — CBC WITH DIFFERENTIAL/PLATELET
Basophils Absolute: 0 10*3/uL (ref 0.0–0.2)
Basos: 0 %
EOS (ABSOLUTE): 0.2 10*3/uL (ref 0.0–0.4)
EOS: 4 %
HEMATOCRIT: 35.3 % (ref 34.0–46.6)
HEMOGLOBIN: 11.6 g/dL (ref 11.1–15.9)
IMMATURE GRANULOCYTES: 0 %
Immature Grans (Abs): 0 10*3/uL (ref 0.0–0.1)
LYMPHS ABS: 1.6 10*3/uL (ref 0.7–3.1)
Lymphs: 36 %
MCH: 28.5 pg (ref 26.6–33.0)
MCHC: 32.9 g/dL (ref 31.5–35.7)
MCV: 87 fL (ref 79–97)
MONOCYTES: 8 %
Monocytes Absolute: 0.4 10*3/uL (ref 0.1–0.9)
Neutrophils Absolute: 2.4 10*3/uL (ref 1.4–7.0)
Neutrophils: 52 %
Platelets: 247 10*3/uL (ref 150–379)
RBC: 4.07 x10E6/uL (ref 3.77–5.28)
RDW: 13.9 % (ref 12.3–15.4)
WBC: 4.6 10*3/uL (ref 3.4–10.8)

## 2015-12-16 LAB — THYROID PANEL WITH TSH
Free Thyroxine Index: 2.9 (ref 1.2–4.9)
T3 Uptake Ratio: 28 % (ref 24–39)
T4 TOTAL: 10.5 ug/dL (ref 4.5–12.0)
TSH: 1.41 u[IU]/mL (ref 0.450–4.500)

## 2015-12-16 LAB — BMP8+EGFR
BUN / CREAT RATIO: 19 (ref 12–28)
BUN: 13 mg/dL (ref 8–27)
CHLORIDE: 103 mmol/L (ref 96–106)
CO2: 25 mmol/L (ref 18–29)
CREATININE: 0.7 mg/dL (ref 0.57–1.00)
Calcium: 8.5 mg/dL — ABNORMAL LOW (ref 8.7–10.3)
GFR calc non Af Amer: 86 mL/min/{1.73_m2} (ref >59)
GFR, EST AFRICAN AMERICAN: 99 mL/min/{1.73_m2} (ref >59)
Glucose: 136 mg/dL — ABNORMAL HIGH (ref 65–99)
Potassium: 3.4 mmol/L — ABNORMAL LOW (ref 3.5–5.2)
SODIUM: 144 mmol/L (ref 134–144)

## 2015-12-16 LAB — LIPID PANEL
CHOL/HDL RATIO: 2.6 ratio (ref 0.0–4.4)
Cholesterol, Total: 157 mg/dL (ref 100–199)
HDL: 61 mg/dL (ref >39)
LDL CALC: 77 mg/dL (ref 0–99)
Triglycerides: 97 mg/dL (ref 0–149)
VLDL CHOLESTEROL CAL: 19 mg/dL (ref 5–40)

## 2015-12-16 LAB — HEPATIC FUNCTION PANEL
ALK PHOS: 108 IU/L (ref 39–117)
ALT: 10 IU/L (ref 0–32)
AST: 19 IU/L (ref 0–40)
Albumin: 3.9 g/dL (ref 3.5–4.8)
BILIRUBIN, DIRECT: 0.13 mg/dL (ref 0.00–0.40)
Bilirubin Total: 0.4 mg/dL (ref 0.0–1.2)
Total Protein: 6.2 g/dL (ref 6.0–8.5)

## 2015-12-16 LAB — VITAMIN B12: VITAMIN B 12: 543 pg/mL (ref 211–946)

## 2015-12-18 LAB — SPECIMEN STATUS REPORT

## 2015-12-18 LAB — HGB A1C W/O EAG: HEMOGLOBIN A1C: 5 % (ref 4.8–5.6)

## 2015-12-23 ENCOUNTER — Ambulatory Visit: Payer: Medicare Other | Admitting: Family Medicine

## 2015-12-24 ENCOUNTER — Encounter: Payer: Self-pay | Admitting: Family Medicine

## 2015-12-24 ENCOUNTER — Ambulatory Visit (INDEPENDENT_AMBULATORY_CARE_PROVIDER_SITE_OTHER): Payer: Medicare Other | Admitting: Family Medicine

## 2015-12-24 VITALS — BP 121/61 | HR 62 | Temp 97.2°F | Ht 65.0 in | Wt 176.0 lb

## 2015-12-24 DIAGNOSIS — D692 Other nonthrombocytopenic purpura: Secondary | ICD-10-CM | POA: Diagnosis not present

## 2015-12-24 DIAGNOSIS — I712 Thoracic aortic aneurysm, without rupture: Secondary | ICD-10-CM | POA: Diagnosis not present

## 2015-12-24 DIAGNOSIS — I10 Essential (primary) hypertension: Secondary | ICD-10-CM | POA: Diagnosis not present

## 2015-12-24 DIAGNOSIS — C73 Malignant neoplasm of thyroid gland: Secondary | ICD-10-CM | POA: Diagnosis not present

## 2015-12-24 DIAGNOSIS — E538 Deficiency of other specified B group vitamins: Secondary | ICD-10-CM

## 2015-12-24 DIAGNOSIS — E785 Hyperlipidemia, unspecified: Secondary | ICD-10-CM | POA: Diagnosis not present

## 2015-12-24 DIAGNOSIS — E876 Hypokalemia: Secondary | ICD-10-CM | POA: Diagnosis not present

## 2015-12-24 DIAGNOSIS — E559 Vitamin D deficiency, unspecified: Secondary | ICD-10-CM

## 2015-12-24 DIAGNOSIS — I7121 Aneurysm of the ascending aorta, without rupture: Secondary | ICD-10-CM

## 2015-12-24 LAB — BMP8+EGFR
BUN / CREAT RATIO: 22 (ref 12–28)
BUN: 16 mg/dL (ref 8–27)
CHLORIDE: 97 mmol/L (ref 96–106)
CO2: 27 mmol/L (ref 18–29)
Calcium: 8.7 mg/dL (ref 8.7–10.3)
Creatinine, Ser: 0.74 mg/dL (ref 0.57–1.00)
GFR calc Af Amer: 93 mL/min/{1.73_m2} (ref >59)
GFR calc non Af Amer: 81 mL/min/{1.73_m2} (ref >59)
GLUCOSE: 83 mg/dL (ref 65–99)
Potassium: 4.3 mmol/L (ref 3.5–5.2)
SODIUM: 138 mmol/L (ref 134–144)

## 2015-12-24 MED ORDER — HYDROCODONE-ACETAMINOPHEN 5-325 MG PO TABS: 1.0000 | ORAL_TABLET | Freq: Two times a day (BID) | ORAL | Status: DC | PRN

## 2015-12-24 MED ORDER — HYDROCORTISONE 2.5 % RE CREA: TOPICAL_CREAM | RECTAL | Status: DC

## 2015-12-24 NOTE — Patient Instructions (Addendum)
Medicare Annual Wellness Visit  [facility] and the medical providers at [facility] strive to bring you the best medical care.  In doing so we not only want to address your current medical conditions and concerns but also to detect new conditions early and prevent illness, disease and health-related problems.    Medicare offers a yearly Wellness Visit which allows our clinical staff to assess your need for preventative services including immunizations, lifestyle education, counseling to decrease risk of preventable diseases and screening for fall risk and other medical concerns.    This visit is provided free of charge (no copay) for all Medicare recipients. The clinical pharmacists at [facility] have begun to conduct these Wellness Visits which will also include a thorough review of all your medications.    As you primary medical provider recommend that you make an appointment for your Annual Wellness Visit if you have not done so already this year.  You may set up this appointment before you leave today or you may call back [id number]) and schedule an appointment.  Please make sure when you call that you mention that you are scheduling your Annual Wellness Visit with the clinical pharmacist so that the appointment may be made for the proper length of time.     Continue current medications. Continue good therapeutic lifestyle changes which include good diet and exercise. Fall precautions discussed with patient. If an FOBT was given today- please return it to our front desk. If you are over 47 years old - you may need Prevnar 53 or the adult Pneumonia vaccine.  **Flu shots are available--- please call and schedule a FLU-CLINIC appointment**  After your visit with [place] today you will receive a survey in the mail or online from [employer] regarding your care with [place]. Please take a moment to fill this out. Your feedback is very  important to [place] as you can help [place] better understand your patient needs as well as improve your experience and satisfaction. WE CARE ABOUT YOU!!!   The patient should follow-up as planned with her vascular surgeon her thyroid surgeon and her endocrinologist She should continue to be careful not put herself at risk for falling She can purchase the equate brand of ranitidine 150 mg and try taking one of these daily before dinner to see if this helps her heartburn.

## 2015-12-24 NOTE — Progress Notes (Signed)
Subjective:    Patient ID: [name], female    DOB: January 01, 1941, 75 y.o.   MRN: [id number]  HPI Pt here for follow up and management of chronic medical problems which includes hypertension, hyperlipidemia and thyroid cancer. She is taking medications regularly.The patient's significant history is that she now has thyroid cancer which was diagnosed in February of this year. She also has an ascending aortic aneurysm. She does complain of some trouble swallowing at times. And there is a feeling of choking after eating. She is requesting refills on a couple of minutes. She's had her lab work and we will review this with her during the visit today. The patient asked he says her swallowing is getting some better and it seems to be more surgical related. She does chew her food well. She denies any chest pain or shortness of breath. She does have some occasional heartburn. She's been taking times and she is off of Prilosec currently. He is not seen any blood in the stool or had any black tarry bowel movements. She is passing her water without problems. She follows up regularly with the vascular surgeon for her aneurysm and we'll see him on a yearly basis. She will see the endocrinologist again in September and her surgeon for her thyroid in October.     Patient Active Problem List   Diagnosis Date Noted  . Ascending aortic aneurysm ([place]) 08/20/2015  . Thyroid cancer ([place]) 07/30/2015  . Left thyroid nodule 07/27/2015  . Osteoporosis with fracture 04/16/2014  . At high risk for falls 04/16/2014  . Proximal humerus fracture 05/30/2011  . CHEST PAIN, PRECORDIAL 02/26/2010  . Hyperlipidemia 02/23/2010  . Depression 02/23/2010  . GLAUCOMA 02/23/2010  . CATARACTS 02/23/2010  . RHEUMATIC FEVER 02/23/2010  . Essential hypertension 02/23/2010  . MITRAL VALVE PROLAPSE 02/23/2010  . GERD 02/23/2010  . OSTEOARTHRITIS 02/23/2010  . Primary fibromyalgia syndrome 02/23/2010   Outpatient Encounter  Prescriptions as of 12/24/2015  Medication Sig  . aspirin 81 MG tablet Take 1 tablet (81 mg total) by mouth every other day.  Marland Kitchen atenolol (TENORMIN) 50 MG tablet TAKE 1 TABLET DAILY  . calcium carbonate (OS-CAL - DOSED IN MG OF ELEMENTAL CALCIUM) 1250 (500 Ca) MG tablet Take 2 tablets (1,000 mg of elemental calcium total) by mouth 3 (three) times daily with meals. (Patient taking differently: Take 2 tablets by mouth 3 (three) times daily with meals. 5 a day)  . celecoxib (CELEBREX) 200 MG capsule TAKE (1) CAPSULE DAILY  . clorazepate (TRANXENE) 7.5 MG tablet TAKE 1 TABLET DAILY AS NEEDED (Patient taking differently: TAKE 1 TABLET DAILY AS NEEDED FOR ANXIETY)  . fluticasone (FLONASE) 50 MCG/ACT nasal spray Place 2 sprays into the nose daily. (Patient taking differently: Place 2 sprays into the nose daily as needed for allergies. )  . HYDROcodone-acetaminophen (NORCO/VICODIN) 5-325 MG tablet Take 1 tablet by mouth every 12 (twelve) hours as needed for moderate pain.  . hydrocortisone (PROCTOZONE-HC) 2.5 % rectal cream APPLY TO RECTALLY 2 TIMES A DAY AS NEEDED  . levothyroxine (SYNTHROID, LEVOTHROID) 75 MCG tablet Take 75 mcg by mouth daily before breakfast.  . Multiple Vitamins-Minerals (SENIOR MULTIVITAMIN PLUS PO) Take by mouth daily.    . Omega-3 Fatty Acids (FISH OIL PO) Take 1 capsule by mouth daily.  Marland Kitchen omeprazole (PRILOSEC) 20 MG capsule Take 20 mg by mouth 2 (two) times daily.  . simvastatin (ZOCOR) 80 MG tablet TAKE ONE TABLET AT BEDTIME  . TRAVATAN Z 0.004 %  SOLN ophthalmic solution INSTILL 1 DROP INTO RIGHT EYE AT BEDTIME  . venlafaxine XR (EFFEXOR-XR) 75 MG 24 hr capsule TAKE (1) CAPSULE DAILY  . Vitamin D, Ergocalciferol, (DRISDOL) 50000 units CAPS capsule Take 50,000 Units by mouth every 7 (seven) days.  Marland Kitchen zolpidem (AMBIEN) 10 MG tablet TAKE 1/2 TABLET AT BEDTIME AS NEEDED  . [DISCONTINUED] HYDROcodone-acetaminophen (NORCO/VICODIN) 5-325 MG tablet Take 1-2 tablets by mouth every 4 (four)  hours as needed for moderate pain.  . [DISCONTINUED] hydrocortisone (PROCTOZONE-HC) 2.5 % rectal cream APPLY TO RECTALLY 2 TIMES A DAY AS NEEDED (Patient taking differently: Place 1 application rectally every other day. )  . [DISCONTINUED] ibuprofen (ADVIL,MOTRIN) 200 MG tablet Take 200 mg by mouth every 6 (six) hours as needed.   Facility-Administered Encounter Medications as of 12/24/2015  Medication  . cyanocobalamin ((VITAMIN B-12)) injection 1,000 mcg     Review of Systems  Constitutional: Negative.   HENT: Negative.        Choking after eating at times   Eyes: Negative.   Respiratory: Negative.   Cardiovascular: Negative.   Gastrointestinal: Negative.   Endocrine: Negative.   Genitourinary: Negative.   Musculoskeletal: Negative.   Skin: Negative.   Allergic/Immunologic: Negative.   Neurological: Negative.   Hematological: Negative.   Psychiatric/Behavioral: Negative.        Objective:   Physical Exam  Constitutional: She is oriented to person, place, and time. She appears well-developed and well-nourished. No distress.  HENT:  Head: Normocephalic and atraumatic.  Right Ear: External ear normal.  Left Ear: External ear normal.  Nose: Nose normal.  Mouth/Throat: Oropharynx is clear and moist.  Eyes: Conjunctivae and EOM are normal. Pupils are equal, round, and reactive to light. Right eye exhibits no discharge. Left eye exhibits no discharge. No scleral icterus.  Neck: Normal range of motion. Neck supple. No thyromegaly present.  No bruits or thyromegaly  Cardiovascular: Normal rate, regular rhythm and normal heart sounds.   No murmur heard. Heart is regular at 72/m  Pulmonary/Chest: Effort normal and breath sounds normal. No respiratory distress. She has no wheezes. She has no rales. She exhibits no tenderness.  Clear anteriorly and posteriorly  Abdominal: Soft. Bowel sounds are normal. She exhibits no mass. There is no tenderness. There is no rebound and no guarding.   No bruits no masses no organ enlargement and no tenderness  Musculoskeletal: Normal range of motion. She exhibits no edema or tenderness.  Lymphadenopathy:    She has no cervical adenopathy.  Neurological: She is alert and oriented to person, place, and time. She has normal reflexes. No cranial nerve deficit.  Skin: Skin is warm and dry. No rash noted.  The patient does have some senile purpura on her arms.  Psychiatric: She has a normal mood and affect. Her behavior is normal. Judgment and thought content normal.  Nursing note and vitals reviewed.  BP 121/61 mmHg  Pulse 62  Temp(Src) 97.2 F (36.2 C) (Oral)  Ht 5' 5"  (1.651 m)  Wt 176 lb (79.833 kg)  BMI 29.29 kg/m2        Assessment & Plan:  1. Essential hypertension -The blood pressure is good today and she should continue with current treatment  2. Thyroid cancer ([place]) -Continue to follow-up with thyroid surgeon an endocrinologist as planned  3. Hyperlipidemia -Continue with aggressive therapeutic lifestyle changes and current cholesterol treatment  4. Vitamin D deficiency -Continue with vitamin D replacement  5. Vitamin B 12 deficiency -Continue with B12 treatment pending results  of lab work  6. Decreased potassium in the blood -The potassium on her routine blood work was slightly decreased and we'll repeat this today to make sure that it is improved - BMP8+EGFR  7. Ascending aortic aneurysm ([place]) -Continue follow-up with vascular surgeon  8. Senile purpura ([facility]) -Continue to be careful not to yourself at risk for falling  Meds ordered this encounter  Medications  . hydrocortisone (PROCTOZONE-HC) 2.5 % rectal cream    Sig: APPLY TO RECTALLY 2 TIMES A DAY AS NEEDED    Dispense:  30 g    Refill:  3  . HYDROcodone-acetaminophen (NORCO/VICODIN) 5-325 MG tablet    Sig: Take 1 tablet by mouth every 12 (twelve) hours as needed for moderate pain.    Dispense:  30 tablet    Refill:  0   Patient Instructions                        Medicare Annual Wellness Visit  [facility] and the medical providers at [facility] strive to bring you the best medical care.  In doing so we not only want to address your current medical conditions and concerns but also to detect new conditions early and prevent illness, disease and health-related problems.    Medicare offers a yearly Wellness Visit which allows our clinical staff to assess your need for preventative services including immunizations, lifestyle education, counseling to decrease risk of preventable diseases and screening for fall risk and other medical concerns.    This visit is provided free of charge (no copay) for all Medicare recipients. The clinical pharmacists at [facility] have begun to conduct these Wellness Visits which will also include a thorough review of all your medications.    As you primary medical provider recommend that you make an appointment for your Annual Wellness Visit if you have not done so already this year.  You may set up this appointment before you leave today or you may call back ([phone]) and schedule an appointment.  Please make sure when you call that you mention that you are scheduling your Annual Wellness Visit with the clinical pharmacist so that the appointment may be made for the proper length of time.     Continue current medications. Continue good therapeutic lifestyle changes which include good diet and exercise. Fall precautions discussed with patient. If an FOBT was given today- please return it to our front desk. If you are over 18 years old - you may need Prevnar 35 or the adult Pneumonia vaccine.  **Flu shots are available--- please call and schedule a FLU-CLINIC appointment**  After your visit with [place] today you will receive a survey in the mail or online from [employer] regarding your care with [place]. Please take a moment to fill this out. Your feedback is very  important to [place] as you can help [place] better understand your patient needs as well as improve your experience and satisfaction. WE CARE ABOUT YOU!!!   The patient should follow-up as planned with her vascular surgeon her thyroid surgeon and her endocrinologist She should continue to be careful not put herself at risk for falling She can purchase the equate brand of ranitidine 150 mg and try taking one of these daily before dinner to see if this helps her heartburn.    Arrie Senate MD

## 2015-12-25 DIAGNOSIS — C73 Malignant neoplasm of thyroid gland: Secondary | ICD-10-CM | POA: Diagnosis not present

## 2016-01-15 ENCOUNTER — Other Ambulatory Visit: Payer: Self-pay | Admitting: Family Medicine

## 2016-01-19 NOTE — Progress Notes (Signed)
This encounter was created in error - please disregard.

## 2016-02-03 ENCOUNTER — Encounter: Payer: Self-pay | Admitting: Family Medicine

## 2016-02-03 ENCOUNTER — Ambulatory Visit (INDEPENDENT_AMBULATORY_CARE_PROVIDER_SITE_OTHER): Payer: Medicare Other | Admitting: Family Medicine

## 2016-02-03 VITALS — BP 113/65 | HR 75 | Temp 99.4°F | Ht 65.0 in | Wt 175.0 lb

## 2016-02-03 DIAGNOSIS — E538 Deficiency of other specified B group vitamins: Secondary | ICD-10-CM

## 2016-02-03 DIAGNOSIS — J310 Chronic rhinitis: Secondary | ICD-10-CM

## 2016-02-03 DIAGNOSIS — R059 Cough, unspecified: Secondary | ICD-10-CM

## 2016-02-03 DIAGNOSIS — J329 Chronic sinusitis, unspecified: Secondary | ICD-10-CM

## 2016-02-03 DIAGNOSIS — R5081 Fever presenting with conditions classified elsewhere: Secondary | ICD-10-CM

## 2016-02-03 DIAGNOSIS — R05 Cough: Secondary | ICD-10-CM | POA: Diagnosis not present

## 2016-02-03 MED ORDER — AMOXICILLIN 500 MG PO CAPS: 500.0000 mg | ORAL_CAPSULE | Freq: Three times a day (TID) | ORAL | 0 refills | Status: DC

## 2016-02-03 MED ORDER — HYDROCODONE-HOMATROPINE 5-1.5 MG/5ML PO SYRP: 5.0000 mL | ORAL_SOLUTION | Freq: Every evening | ORAL | 0 refills | Status: DC | PRN

## 2016-02-03 NOTE — Progress Notes (Signed)
Subjective:    Patient ID: [name], female    DOB: [birth year], 75 y.o.   MRN: [id number]  HPI Patient here today for possible URI. The patient complains of headache sore throat sneezing chills nasal drainage and cough. This started last night. She does have a low-grade fever. No one else has been sick at home.     Patient Active Problem List   Diagnosis Date Noted  . Ascending aortic aneurysm ([place]) 08/20/2015  . Thyroid cancer ([place]) 07/30/2015  . Left thyroid nodule 07/27/2015  . Osteoporosis with fracture 04/16/2014  . At high risk for falls 04/16/2014  . Proximal humerus fracture 05/30/2011  . CHEST PAIN, PRECORDIAL 02/26/2010  . Hyperlipidemia 02/23/2010  . Depression 02/23/2010  . GLAUCOMA 02/23/2010  . CATARACTS 02/23/2010  . RHEUMATIC FEVER 02/23/2010  . Essential hypertension 02/23/2010  . MITRAL VALVE PROLAPSE 02/23/2010  . GERD 02/23/2010  . OSTEOARTHRITIS 02/23/2010  . Primary fibromyalgia syndrome 02/23/2010   Outpatient Encounter Prescriptions as of 02/03/2016  Medication Sig  . aspirin 81 MG tablet Take 1 tablet (81 mg total) by mouth every other day.  Marland Kitchen atenolol (TENORMIN) 50 MG tablet TAKE 1 TABLET DAILY  . calcium carbonate (OS-CAL - DOSED IN MG OF ELEMENTAL CALCIUM) 1250 (500 Ca) MG tablet Take 2 tablets (1,000 mg of elemental calcium total) by mouth 3 (three) times daily with meals. (Patient taking differently: Take 2 tablets by mouth 3 (three) times daily with meals. 5 a day)  . celecoxib (CELEBREX) 200 MG capsule TAKE (1) CAPSULE DAILY  . clorazepate (TRANXENE) 7.5 MG tablet TAKE 1 TABLET DAILY AS NEEDED (Patient taking differently: TAKE 1 TABLET DAILY AS NEEDED FOR ANXIETY)  . fluticasone (FLONASE) 50 MCG/ACT nasal spray Place 2 sprays into the nose daily. (Patient taking differently: Place 2 sprays into the nose daily as needed for allergies. )  . HYDROcodone-acetaminophen (NORCO/VICODIN) 5-325 MG tablet Take 1 tablet by mouth every 12 (twelve) hours  as needed for moderate pain.  . hydrocortisone (PROCTOZONE-HC) 2.5 % rectal cream APPLY TO RECTALLY 2 TIMES A DAY AS NEEDED  . levothyroxine (SYNTHROID, LEVOTHROID) 75 MCG tablet Take 75 mcg by mouth daily before breakfast.  . Multiple Vitamins-Minerals (SENIOR MULTIVITAMIN PLUS PO) Take by mouth daily.    . Omega-3 Fatty Acids (FISH OIL PO) Take 1 capsule by mouth daily.  . simvastatin (ZOCOR) 80 MG tablet TAKE ONE TABLET AT BEDTIME  . TRAVATAN Z 0.004 % SOLN ophthalmic solution INSTILL 1 DROP INTO RIGHT EYE AT BEDTIME  . venlafaxine XR (EFFEXOR-XR) 75 MG 24 hr capsule TAKE (1) CAPSULE DAILY  . Vitamin D, Ergocalciferol, (DRISDOL) 50000 units CAPS capsule Take 50,000 Units by mouth every 7 (seven) days.  Marland Kitchen zolpidem (AMBIEN) 10 MG tablet TAKE 1/2 TABLET AT BEDTIME AS NEEDED  . [DISCONTINUED] omeprazole (PRILOSEC) 20 MG capsule Take 20 mg by mouth 2 (two) times daily.   Facility-Administered Encounter Medications as of 02/03/2016  Medication  . cyanocobalamin ((VITAMIN B-12)) injection 1,000 mcg       Review of Systems  Constitutional: Positive for chills.  HENT: Positive for congestion, postnasal drip, sneezing and sore throat.   Eyes: Negative.   Respiratory: Positive for cough.   Cardiovascular: Negative.   Gastrointestinal: Negative.   Endocrine: Negative.   Genitourinary: Negative.   Musculoskeletal: Positive for myalgias.  Skin: Negative.   Allergic/Immunologic: Negative.   Neurological: Positive for headaches.  Hematological: Negative.   Psychiatric/Behavioral: Negative.        Objective:  Physical Exam  Constitutional: She is oriented to person, place, and time. She appears well-developed and well-nourished. No distress.  HENT:  Head: Normocephalic and atraumatic.  Right Ear: External ear normal.  Left Ear: External ear normal.  Nasal congestion bilaterally throat is red posteriorly and there are no anterior cervical nodes. The right TM is full. There is tenderness  over all of the sinuses.  Eyes: Conjunctivae and EOM are normal. Pupils are equal, round, and reactive to light. Right eye exhibits no discharge. Left eye exhibits no discharge. No scleral icterus.  Neck: Normal range of motion. Neck supple. No thyromegaly present.  There is anterior cervical tenderness without adenopathy.  Cardiovascular: Normal rate, regular rhythm and normal heart sounds.   No murmur heard. Pulmonary/Chest: Effort normal and breath sounds normal. No respiratory distress. She has no wheezes. She has no rales. She exhibits no tenderness.  The patient has a dry cough without wheezes currently.  Musculoskeletal: Normal range of motion. She exhibits no edema.  Lymphadenopathy:    She has no cervical adenopathy.  Neurological: She is alert and oriented to person, place, and time.  Skin: Skin is warm and dry. No rash noted.  Psychiatric: She has a normal mood and affect. Her behavior is normal. Judgment and thought content normal.  Nursing note and vitals reviewed.   BP 113/65 (BP Location: Left Arm)   Pulse 75   Temp 99.4 F (37.4 C) (Oral)   Ht 5\' 5"  (1.651 m)   Wt 175 lb (79.4 kg)   BMI 29.12 kg/m         Assessment & Plan:  1. Rhinosinusitis -Use nasal saline as directed and take Tylenol as needed for aches pains and fever - amoxicillin (AMOXIL) 500 MG capsule; Take 1 capsule (500 mg total) by mouth 3 (three) times daily.  Dispense: 30 capsule; Refill: 0  2. Cough -Use Mucinex maximum strength 1 twice daily and stronger cough medicine if needed at nighttime for severe cough - amoxicillin (AMOXIL) 500 MG capsule; Take 1 capsule (500 mg total) by mouth 3 (three) times daily.  Dispense: 30 capsule; Refill: 0  3. Fever presenting with conditions classified elsewhere -Take Tylenol for aches pains and fever Patient Instructions  Use nasal saline 1 spray each nostril 4 times daily Continue with Flonase Use Mucinex maximum strength, blue and white in color, 1 twice  daily with a large glass of water for cough and congestion Take antibiotic amoxicillin 503 times daily for infection until completed Use stronger cough medicine if needed at night for severe cough Rest and drink plenty of fluids and take Tylenol for aches pains and fever  Arrie Senate MD

## 2016-02-03 NOTE — Patient Instructions (Signed)
Use nasal saline 1 spray each nostril 4 times daily Continue with Flonase Use Mucinex maximum strength, blue and white in color, 1 twice daily with a large glass of water for cough and congestion Take antibiotic amoxicillin 503 times daily for infection until completed Use stronger cough medicine if needed at night for severe cough Rest and drink plenty of fluids and take Tylenol for aches pains and fever

## 2016-02-19 ENCOUNTER — Other Ambulatory Visit: Payer: Self-pay | Admitting: Family Medicine

## 2016-03-01 ENCOUNTER — Ambulatory Visit (INDEPENDENT_AMBULATORY_CARE_PROVIDER_SITE_OTHER): Payer: Medicare Other

## 2016-03-01 DIAGNOSIS — E538 Deficiency of other specified B group vitamins: Secondary | ICD-10-CM | POA: Diagnosis not present

## 2016-03-01 NOTE — Patient Instructions (Signed)

## 2016-03-01 NOTE — Progress Notes (Signed)
Patient given B-12 injection to left deltoid and tolerated well 

## 2016-03-03 ENCOUNTER — Other Ambulatory Visit: Payer: Self-pay | Admitting: Family

## 2016-03-03 NOTE — Telephone Encounter (Signed)
Please call in ambien with 1 refills 

## 2016-03-22 ENCOUNTER — Other Ambulatory Visit (INDEPENDENT_AMBULATORY_CARE_PROVIDER_SITE_OTHER): Payer: Medicare Other

## 2016-03-22 ENCOUNTER — Ambulatory Visit: Payer: Medicare Other

## 2016-03-22 DIAGNOSIS — E892 Postprocedural hypoparathyroidism: Secondary | ICD-10-CM | POA: Diagnosis not present

## 2016-03-22 DIAGNOSIS — Z23 Encounter for immunization: Secondary | ICD-10-CM | POA: Diagnosis not present

## 2016-03-23 DIAGNOSIS — H401121 Primary open-angle glaucoma, left eye, mild stage: Secondary | ICD-10-CM | POA: Diagnosis not present

## 2016-03-23 DIAGNOSIS — H40002 Preglaucoma, unspecified, left eye: Secondary | ICD-10-CM | POA: Diagnosis not present

## 2016-04-01 ENCOUNTER — Ambulatory Visit (INDEPENDENT_AMBULATORY_CARE_PROVIDER_SITE_OTHER): Payer: Medicare Other | Admitting: *Deleted

## 2016-04-01 DIAGNOSIS — R451 Restlessness and agitation: Secondary | ICD-10-CM | POA: Diagnosis not present

## 2016-04-01 DIAGNOSIS — E890 Postprocedural hypothyroidism: Secondary | ICD-10-CM | POA: Diagnosis not present

## 2016-04-01 DIAGNOSIS — E538 Deficiency of other specified B group vitamins: Secondary | ICD-10-CM | POA: Diagnosis not present

## 2016-04-01 DIAGNOSIS — R632 Polyphagia: Secondary | ICD-10-CM | POA: Diagnosis not present

## 2016-04-01 DIAGNOSIS — G4700 Insomnia, unspecified: Secondary | ICD-10-CM | POA: Diagnosis not present

## 2016-04-01 DIAGNOSIS — C73 Malignant neoplasm of thyroid gland: Secondary | ICD-10-CM | POA: Diagnosis not present

## 2016-04-01 NOTE — Progress Notes (Signed)
Pt given Vit B12 inj Tolerated well 

## 2016-04-15 ENCOUNTER — Other Ambulatory Visit: Payer: Self-pay | Admitting: Family Medicine

## 2016-04-22 ENCOUNTER — Other Ambulatory Visit: Payer: Self-pay | Admitting: Family Medicine

## 2016-05-03 ENCOUNTER — Ambulatory Visit: Payer: Medicare Other

## 2016-05-03 ENCOUNTER — Ambulatory Visit: Payer: Medicare Other | Admitting: Family Medicine

## 2016-05-03 ENCOUNTER — Other Ambulatory Visit (INDEPENDENT_AMBULATORY_CARE_PROVIDER_SITE_OTHER): Payer: Medicare Other

## 2016-05-03 DIAGNOSIS — C73 Malignant neoplasm of thyroid gland: Secondary | ICD-10-CM | POA: Diagnosis not present

## 2016-05-03 DIAGNOSIS — R300 Dysuria: Secondary | ICD-10-CM | POA: Diagnosis not present

## 2016-05-03 DIAGNOSIS — E538 Deficiency of other specified B group vitamins: Secondary | ICD-10-CM | POA: Diagnosis not present

## 2016-05-03 DIAGNOSIS — E559 Vitamin D deficiency, unspecified: Secondary | ICD-10-CM

## 2016-05-03 DIAGNOSIS — K219 Gastro-esophageal reflux disease without esophagitis: Secondary | ICD-10-CM

## 2016-05-03 DIAGNOSIS — E7800 Pure hypercholesterolemia, unspecified: Secondary | ICD-10-CM

## 2016-05-03 DIAGNOSIS — I10 Essential (primary) hypertension: Secondary | ICD-10-CM

## 2016-05-03 LAB — URINALYSIS, COMPLETE
Bilirubin, UA: NEGATIVE
GLUCOSE, UA: NEGATIVE
Ketones, UA: NEGATIVE
NITRITE UA: NEGATIVE
PH UA: 6 (ref 5.0–7.5)
Specific Gravity, UA: 1.02 (ref 1.005–1.030)
UUROB: 0.2 mg/dL (ref 0.2–1.0)

## 2016-05-03 LAB — MICROSCOPIC EXAMINATION: Epithelial Cells (non renal): >10 /hpf — AB (ref 0–10)

## 2016-05-04 LAB — CBC WITH DIFFERENTIAL/PLATELET
BASOS ABS: 0 10*3/uL (ref 0.0–0.2)
Basos: 1 %
EOS (ABSOLUTE): 0.2 10*3/uL (ref 0.0–0.4)
Eos: 4 %
HEMOGLOBIN: 11.7 g/dL (ref 11.1–15.9)
Hematocrit: 34 % (ref 34.0–46.6)
Immature Grans (Abs): 0 10*3/uL (ref 0.0–0.1)
Immature Granulocytes: 0 %
LYMPHS ABS: 1.6 10*3/uL (ref 0.7–3.1)
Lymphs: 40 %
MCH: 29 pg (ref 26.6–33.0)
MCHC: 34.4 g/dL (ref 31.5–35.7)
MCV: 84 fL (ref 79–97)
MONOS ABS: 0.4 10*3/uL (ref 0.1–0.9)
Monocytes: 10 %
NEUTROS ABS: 1.9 10*3/uL (ref 1.4–7.0)
Neutrophils: 45 %
PLATELETS: 216 10*3/uL (ref 150–379)
RBC: 4.03 x10E6/uL (ref 3.77–5.28)
RDW: 14.2 % (ref 12.3–15.4)
WBC: 4.1 10*3/uL (ref 3.4–10.8)

## 2016-05-04 LAB — HEPATIC FUNCTION PANEL
ALK PHOS: 98 IU/L (ref 39–117)
ALT: 13 IU/L (ref 0–32)
AST: 20 IU/L (ref 0–40)
Albumin: 3.8 g/dL (ref 3.5–4.8)
BILIRUBIN, DIRECT: 0.11 mg/dL (ref 0.00–0.40)
Bilirubin Total: 0.3 mg/dL (ref 0.0–1.2)
Total Protein: 6.1 g/dL (ref 6.0–8.5)

## 2016-05-04 LAB — BMP8+EGFR
BUN / CREAT RATIO: 14 (ref 12–28)
BUN: 10 mg/dL (ref 8–27)
CHLORIDE: 102 mmol/L (ref 96–106)
CO2: 26 mmol/L (ref 18–29)
Calcium: 8 mg/dL — ABNORMAL LOW (ref 8.7–10.3)
Creatinine, Ser: 0.71 mg/dL (ref 0.57–1.00)
GFR calc non Af Amer: 85 mL/min/{1.73_m2} (ref >59)
GFR, EST AFRICAN AMERICAN: 98 mL/min/{1.73_m2} (ref >59)
GLUCOSE: 114 mg/dL — AB (ref 65–99)
POTASSIUM: 3.8 mmol/L (ref 3.5–5.2)
SODIUM: 144 mmol/L (ref 134–144)

## 2016-05-04 LAB — NMR, LIPOPROFILE
Cholesterol: 158 mg/dL (ref 100–199)
HDL Cholesterol by NMR: 58 mg/dL (ref >39)
HDL Particle Number: 40.2 umol/L (ref >=30.5)
LDL PARTICLE NUMBER: 1002 nmol/L — AB (ref <1000)
LDL SIZE: 20.8 nm (ref >20.5)
LDL-C: 82 mg/dL (ref 0–99)
LP-IR SCORE: 27 (ref <=45)
Small LDL Particle Number: 442 nmol/L (ref <=527)
Triglycerides by NMR: 92 mg/dL (ref 0–149)

## 2016-05-04 LAB — VITAMIN D 25 HYDROXY (VIT D DEFICIENCY, FRACTURES): VIT D 25 HYDROXY: 76 ng/mL (ref 30.0–100.0)

## 2016-05-04 LAB — THYROID PANEL WITH TSH
FREE THYROXINE INDEX: 2.4 (ref 1.2–4.9)
T3 Uptake Ratio: 28 % (ref 24–39)
T4, Total: 8.5 ug/dL (ref 4.5–12.0)
TSH: 0.939 u[IU]/mL (ref 0.450–4.500)

## 2016-05-05 LAB — URINE CULTURE

## 2016-05-06 ENCOUNTER — Other Ambulatory Visit: Payer: Self-pay | Admitting: *Deleted

## 2016-05-06 MED ORDER — HYDROCORTISONE 2.5 % RE CREA: TOPICAL_CREAM | RECTAL | 3 refills | Status: DC

## 2016-05-11 ENCOUNTER — Ambulatory Visit (INDEPENDENT_AMBULATORY_CARE_PROVIDER_SITE_OTHER): Payer: Medicare Other | Admitting: *Deleted

## 2016-05-11 DIAGNOSIS — E538 Deficiency of other specified B group vitamins: Secondary | ICD-10-CM | POA: Diagnosis not present

## 2016-05-11 NOTE — Progress Notes (Signed)
Pt given Vit B12 inj Tolerated well 

## 2016-05-13 ENCOUNTER — Other Ambulatory Visit: Payer: Self-pay | Admitting: Family Medicine

## 2016-05-13 ENCOUNTER — Ambulatory Visit: Payer: Medicare Other | Admitting: Family Medicine

## 2016-05-18 DIAGNOSIS — C73 Malignant neoplasm of thyroid gland: Secondary | ICD-10-CM | POA: Diagnosis not present

## 2016-05-18 DIAGNOSIS — Z8719 Personal history of other diseases of the digestive system: Secondary | ICD-10-CM | POA: Diagnosis not present

## 2016-05-18 DIAGNOSIS — I10 Essential (primary) hypertension: Secondary | ICD-10-CM | POA: Diagnosis not present

## 2016-05-18 DIAGNOSIS — E892 Postprocedural hypoparathyroidism: Secondary | ICD-10-CM | POA: Diagnosis not present

## 2016-05-18 DIAGNOSIS — K219 Gastro-esophageal reflux disease without esophagitis: Secondary | ICD-10-CM | POA: Diagnosis not present

## 2016-05-18 DIAGNOSIS — I712 Thoracic aortic aneurysm, without rupture: Secondary | ICD-10-CM | POA: Diagnosis not present

## 2016-05-18 DIAGNOSIS — Z9049 Acquired absence of other specified parts of digestive tract: Secondary | ICD-10-CM | POA: Diagnosis not present

## 2016-05-18 DIAGNOSIS — Z9889 Other specified postprocedural states: Secondary | ICD-10-CM | POA: Diagnosis not present

## 2016-05-27 ENCOUNTER — Other Ambulatory Visit: Payer: Self-pay | Admitting: Nurse Practitioner

## 2016-05-27 NOTE — Telephone Encounter (Signed)
Refill called to [facility] 

## 2016-06-13 ENCOUNTER — Encounter: Payer: Self-pay | Admitting: Family Medicine

## 2016-06-13 ENCOUNTER — Ambulatory Visit (INDEPENDENT_AMBULATORY_CARE_PROVIDER_SITE_OTHER): Payer: Medicare Other | Admitting: Family Medicine

## 2016-06-13 ENCOUNTER — Ambulatory Visit (INDEPENDENT_AMBULATORY_CARE_PROVIDER_SITE_OTHER): Payer: Medicare Other

## 2016-06-13 VITALS — BP 121/61 | HR 68 | Temp 96.9°F | Ht 65.0 in | Wt 178.0 lb

## 2016-06-13 DIAGNOSIS — I10 Essential (primary) hypertension: Secondary | ICD-10-CM | POA: Diagnosis not present

## 2016-06-13 DIAGNOSIS — M25511 Pain in right shoulder: Secondary | ICD-10-CM

## 2016-06-13 DIAGNOSIS — Z780 Asymptomatic menopausal state: Secondary | ICD-10-CM | POA: Diagnosis not present

## 2016-06-13 DIAGNOSIS — E559 Vitamin D deficiency, unspecified: Secondary | ICD-10-CM | POA: Diagnosis not present

## 2016-06-13 DIAGNOSIS — E538 Deficiency of other specified B group vitamins: Secondary | ICD-10-CM | POA: Diagnosis not present

## 2016-06-13 DIAGNOSIS — C73 Malignant neoplasm of thyroid gland: Secondary | ICD-10-CM

## 2016-06-13 DIAGNOSIS — M79641 Pain in right hand: Secondary | ICD-10-CM

## 2016-06-13 DIAGNOSIS — Z13820 Encounter for screening for osteoporosis: Secondary | ICD-10-CM

## 2016-06-13 DIAGNOSIS — E7800 Pure hypercholesterolemia, unspecified: Secondary | ICD-10-CM

## 2016-06-13 MED ORDER — GABAPENTIN 100 MG PO CAPS: ORAL_CAPSULE | ORAL | 1 refills | Status: DC

## 2016-06-13 NOTE — Patient Instructions (Addendum)
Medicare Annual Wellness Visit  [facility] and the medical providers at [facility] strive to bring you the best medical care.  In doing so we not only want to address your current medical conditions and concerns but also to detect new conditions early and prevent illness, disease and health-related problems.    Medicare offers a yearly Wellness Visit which allows our clinical staff to assess your need for preventative services including immunizations, lifestyle education, counseling to decrease risk of preventable diseases and screening for fall risk and other medical concerns.    This visit is provided free of charge (no copay) for all Medicare recipients. The clinical pharmacists at [facility] have begun to conduct these Wellness Visits which will also include a thorough review of all your medications.    As you primary medical provider recommend that you make an appointment for your Annual Wellness Visit if you have not done so already this year.  You may set up this appointment before you leave today or you may call back [phone] and schedule an appointment.  Please make sure when you call that you mention that you are scheduling your Annual Wellness Visit with the clinical pharmacist so that the appointment may be made for the proper length of time.     Continue current medications. Continue good therapeutic lifestyle changes which include good diet and exercise. Fall precautions discussed with patient. If an FOBT was given today- please return it to our front desk. If you are over 59 years old - you may need Prevnar 27 or the adult Pneumonia vaccine.  **Flu shots are available--- please call and schedule a FLU-CLINIC appointment**  After your visit with [place] today you will receive a survey in the mail or online from [employer] regarding your care with [place]. Please take a moment to fill this out. Your feedback is very  important to [place] as you can help [place] better understand your patient needs as well as improve your experience and satisfaction. WE CARE ABOUT YOU!!!   The patient must remember to take her Celebrex when she takes it after eating and if she continues to have acid reflux she would have to discontinue this. All taking the Celebrex she should take ranitidine or Zantac twice daily before breakfast and supper We will make sure that the oncologist and the surgeon for your thyroid cancer get a copy of the recent blood work in case they need to adjust your thyroid medication Wear support hose during the day because of the scar tissue in the left leg from the previous injuries We will get x-rays of the right shoulder and schedule you for physical therapy to evaluate and treat because of the ongoing pain and discomfort and lack of good movement you have with the shoulder We will also schedule you to visit with the orthopedic surgeon when it is here the next time and if you get better he can call and cancel that appointment

## 2016-06-13 NOTE — Progress Notes (Signed)
Subjective:    Patient ID: [name], female    DOB: 10/04/1940, 75 y.o.   MRN: [id number]  HPI Pt here for follow up and management of chronic medical problems which includes hyperlipidemia and hypertension. She is taking medications regularly.The patient today complains of some right shoulder pain and right thumb pain. She is also having some lower extremity edema and swelling. She also complains of increased heartburn. She is due to get a DEXA scan review her lab work today and to return an FOBT. She is also had a recent urinary tract infection in November. Thyroid tests were within normal limits. The blood sugars elevated at 114 with a normal creatinine and electrolytes. The CBC had a good hemoglobin 11.7 and white blood cell count and platelet count that were normal. All liver function tests were normal and the vitamin D level was excellent at 76. Cholesterol numbers slightly elevated with advanced lipid testing were improved from 10 months ago. The LDL C was also improved at 82. The patient has had a rotator cuff repair of the left shoulder. She complains of the right shoulder with pain with movement and motion and with abduction. She would like to get some physical therapy before she sees the orthopedic surgeon. We agreed to this. We will get an x-ray today scheduled for physical therapy and go ahead and schedule her for the orthopedic surgeon if she gets better she can cancel that appointment. She denies any chest pain or shortness of breath. She denies any trouble with nausea vomiting diarrhea or blood in the stool but she does have some heartburn and she has been taking her Celebrex regularly without taking anything to protect her stomach. We've encouraged her to go get some ranitidine to take this twice daily before breakfast and supper and take the Celebrex after breakfast. She is passing her water without problems. She does complain of some pain in the right radiocarpal joint area. She does  not mention any injury.    Patient Active Problem List   Diagnosis Date Noted  . Vitamin B 12 deficiency 03/01/2016  . Ascending aortic aneurysm ([facility]) 08/20/2015  . Thyroid cancer ([facility]) 07/30/2015  . Left thyroid nodule 07/27/2015  . Osteoporosis with fracture 04/16/2014  . At high risk for falls 04/16/2014  . Proximal humerus fracture 05/30/2011  . CHEST PAIN, PRECORDIAL 02/26/2010  . Hyperlipidemia 02/23/2010  . Depression 02/23/2010  . GLAUCOMA 02/23/2010  . CATARACTS 02/23/2010  . RHEUMATIC FEVER 02/23/2010  . Essential hypertension 02/23/2010  . MITRAL VALVE PROLAPSE 02/23/2010  . GERD 02/23/2010  . Osteoarthritis 02/23/2010  . Primary fibromyalgia syndrome 02/23/2010   Outpatient Encounter Prescriptions as of 06/13/2016  Medication Sig  . aspirin 81 MG tablet Take 1 tablet (81 mg total) by mouth every other day.  Marland Kitchen atenolol (TENORMIN) 50 MG tablet TAKE 1 TABLET DAILY  . calcium carbonate (OS-CAL - DOSED IN MG OF ELEMENTAL CALCIUM) 1250 (500 Ca) MG tablet Take 2 tablets (1,000 mg of elemental calcium total) by mouth 3 (three) times daily with meals. (Patient taking differently: Take 2 tablets by mouth 3 (three) times daily with meals. 5 a day)  . celecoxib (CELEBREX) 200 MG capsule TAKE (1) CAPSULE DAILY  . clorazepate (TRANXENE) 7.5 MG tablet TAKE 1 TABLET DAILY AS NEEDED (Patient taking differently: TAKE 1 TABLET DAILY AS NEEDED FOR ANXIETY)  . fluticasone (FLONASE) 50 MCG/ACT nasal spray Place 2 sprays into the nose daily. (Patient taking differently: Place 2 sprays into the nose  daily as needed for allergies. )  . hydrocortisone (PROCTOZONE-HC) 2.5 % rectal cream APPLY TO RECTALLY 2 TIMES A DAY AS NEEDED  . Multiple Vitamins-Minerals (SENIOR MULTIVITAMIN PLUS PO) Take by mouth daily.    . Omega-3 Fatty Acids (FISH OIL PO) Take 1 capsule by mouth daily.  . simvastatin (ZOCOR) 80 MG tablet TAKE ONE TABLET AT BEDTIME  . TRAVATAN Z 0.004 % SOLN ophthalmic solution INSTILL 1  DROP INTO RIGHT EYE AT BEDTIME  . venlafaxine XR (EFFEXOR-XR) 75 MG 24 hr capsule TAKE (1) CAPSULE DAILY  . Vitamin D, Ergocalciferol, (DRISDOL) 50000 units CAPS capsule Take 50,000 Units by mouth every 7 (seven) days.  Marland Kitchen zolpidem (AMBIEN) 10 MG tablet TAKE 1/2 TABLET AT BEDTIME AS NEEDED  . [DISCONTINUED] levothyroxine (SYNTHROID, LEVOTHROID) 75 MCG tablet Take 75 mcg by mouth daily before breakfast.  . levothyroxine (SYNTHROID, LEVOTHROID) 88 MCG tablet Take 1 tablet by mouth daily.  . [DISCONTINUED] amoxicillin (AMOXIL) 500 MG capsule Take 1 capsule (500 mg total) by mouth 3 (three) times daily.  . [DISCONTINUED] HYDROcodone-acetaminophen (NORCO/VICODIN) 5-325 MG tablet Take 1 tablet by mouth every 12 (twelve) hours as needed for moderate pain.  . [DISCONTINUED] HYDROcodone-homatropine (HYCODAN) 5-1.5 MG/5ML syrup Take 5 mLs by mouth at bedtime as needed for cough.   Facility-Administered Encounter Medications as of 06/13/2016  Medication  . cyanocobalamin ((VITAMIN B-12)) injection 1,000 mcg     Review of Systems  Constitutional: Negative.   HENT: Negative.   Eyes: Negative.   Respiratory: Negative.   Cardiovascular: Positive for leg swelling (left lower ext swelling).  Gastrointestinal: Negative.        Heartburn at times  Endocrine: Negative.   Genitourinary: Negative.   Musculoskeletal: Positive for arthralgias (right shoulder pain / right thumb pain).  Skin: Negative.   Allergic/Immunologic: Negative.   Neurological: Negative.   Hematological: Negative.   Psychiatric/Behavioral: Negative.        Objective:   Physical Exam  Constitutional: She is oriented to person, place, and time. She appears well-developed and well-nourished. No distress.  The patient is pleasant and alert despite her aches and pains.  HENT:  Head: Normocephalic and atraumatic.  Right Ear: External ear normal.  Left Ear: External ear normal.  Nose: Nose normal.  Mouth/Throat: Oropharynx is clear  and moist.  Eyes: Conjunctivae and EOM are normal. Pupils are equal, round, and reactive to light. Right eye exhibits no discharge. Left eye exhibits no discharge. No scleral icterus.  Neck: Normal range of motion. Neck supple. No thyromegaly present.  No bruits thyromegaly or anterior cervical adenopathy  Cardiovascular: Normal rate, regular rhythm, normal heart sounds and intact distal pulses.   No murmur heard. Heart has a regular rate and rhythm at 72/m  Pulmonary/Chest: Effort normal and breath sounds normal. No respiratory distress. She has no wheezes. She has no rales.  Clear anteriorly and posteriorly  Abdominal: Soft. Bowel sounds are normal. She exhibits no mass. There is no tenderness. There is no rebound and no guarding.  No liver or spleen enlargement no masses. No bruits. No inguinal adenopathy.  Musculoskeletal: She exhibits tenderness. She exhibits no edema.  Limited range of motion of right shoulder with pain and discomfort  Lymphadenopathy:    She has no cervical adenopathy.  Neurological: She is alert and oriented to person, place, and time. She has normal reflexes. No cranial nerve deficit.  Skin: Skin is warm and dry. No rash noted.  Psychiatric: She has a normal mood and affect. Her behavior  is normal. Judgment and thought content normal.  Nursing note and vitals reviewed.  BP 121/61 (BP Location: Left Arm)   Pulse 68   Temp (!) 96.9 F (36.1 C) (Oral)   Ht 5\' 5"  (1.651 m)   Wt 178 lb (80.7 kg)   BMI 29.62 kg/m         Assessment & Plan:  1. Pure hypercholesterolemia -Continue with current treatment   2. Thyroid cancer ([facility]) -All thyroid function tests were within normal limits and we will make sure that the surgeon and the oncologist get copies of this report in case they need to adjust your medication more.  3. Essential hypertension -The blood pressure is good today and she will continue with current treatment  4. Vitamin D deficiency -The vitamin  D level is good and she will continue with current treatment  5. Right shoulder pain, unspecified chronicity - Ambulatory referral to Physical Therapy - Ambulatory referral to Orthopedic Surgery - DG Shoulder Right; Future  6. Right hand pain - Ambulatory referral to Physical Therapy - DG Hand Complete Right; Future  7. Screening for osteoporosis - DG [facility] DEXA; Future  8. Postmenopausal - DG [facility] DEXA; Future  Patient Instructions                       Medicare Annual Wellness Visit  [facility] and the medical providers at [facility] strive to bring you the best medical care.  In doing so we not only want to address your current medical conditions and concerns but also to detect new conditions early and prevent illness, disease and health-related problems.    Medicare offers a yearly Wellness Visit which allows our clinical staff to assess your need for preventative services including immunizations, lifestyle education, counseling to decrease risk of preventable diseases and screening for fall risk and other medical concerns.    This visit is provided free of charge (no copay) for all Medicare recipients. The clinical pharmacists at [facility] have begun to conduct these Wellness Visits which will also include a thorough review of all your medications.    As you primary medical provider recommend that you make an appointment for your Annual Wellness Visit if you have not done so already this year.  You may set up this appointment before you leave today or you may call back [phone] and schedule an appointment.  Please make sure when you call that you mention that you are scheduling your Annual Wellness Visit with the clinical pharmacist so that the appointment may be made for the proper length of time.     Continue current medications. Continue good therapeutic lifestyle changes which include good diet and exercise. Fall precautions  discussed with patient. If an FOBT was given today- please return it to our front desk. If you are over 36 years old - you may need Prevnar 32 or the adult Pneumonia vaccine.  **Flu shots are available--- please call and schedule a FLU-CLINIC appointment**  After your visit with [place] today you will receive a survey in the mail or online from [employer] regarding your care with [place]. Please take a moment to fill this out. Your feedback is very important to [place] as you can help [place] better understand your patient needs as well as improve your experience and satisfaction. WE CARE ABOUT YOU!!!   The patient must remember to take her Celebrex when she takes it after eating and if she continues  to have acid reflux she would have to discontinue this. All taking the Celebrex she should take ranitidine or Zantac twice daily before breakfast and supper We will make sure that the oncologist and the surgeon for your thyroid cancer get a copy of the recent blood work in case they need to adjust your thyroid medication Wear support hose during the day because of the scar tissue in the left leg from the previous injuries We will get x-rays of the right shoulder and schedule you for physical therapy to evaluate and treat because of the ongoing pain and discomfort and lack of good movement you have with the shoulder We will also schedule you to visit with the orthopedic surgeon when it is here the next time and if you get better he can call and cancel that appointment    Arrie Senate MD

## 2016-06-17 ENCOUNTER — Other Ambulatory Visit: Payer: Medicare Other

## 2016-06-17 DIAGNOSIS — Z1211 Encounter for screening for malignant neoplasm of colon: Secondary | ICD-10-CM

## 2016-06-18 LAB — FECAL OCCULT BLOOD, IMMUNOCHEMICAL: FECAL OCCULT BLD: POSITIVE — AB

## 2016-06-21 ENCOUNTER — Other Ambulatory Visit: Payer: Self-pay | Admitting: *Deleted

## 2016-06-21 DIAGNOSIS — R195 Other fecal abnormalities: Secondary | ICD-10-CM

## 2016-06-22 ENCOUNTER — Ambulatory Visit: Payer: Medicare Other | Attending: Family Medicine | Admitting: Physical Therapy

## 2016-06-22 DIAGNOSIS — M25511 Pain in right shoulder: Secondary | ICD-10-CM | POA: Diagnosis not present

## 2016-06-22 DIAGNOSIS — M25611 Stiffness of right shoulder, not elsewhere classified: Secondary | ICD-10-CM | POA: Insufficient documentation

## 2016-06-22 NOTE — Therapy (Signed)
[facility] [place], [place], [address] Phone: [phone]   Fax:  [phone]  Physical Therapy Evaluation  Patient Details  Name: [name] MRN: [id number] Date of Birth: 15-Mar-1941 Referring Provider: Redge Gainer MD  Encounter Date: 06/22/2016      PT End of Session - 06/22/16 1300    Visit Number 1   Number of Visits 16   Date for PT Re-Evaluation 08/21/16   PT Start Time 0910   PT Stop Time 0955   PT Time Calculation (min) 45 min   Activity Tolerance Patient tolerated treatment well   Behavior During Therapy [facility] for tasks assessed/performed      Past Medical History:  Diagnosis Date  . ACL tear    right  Dr. Gladstone Pih   . Adenomatous polyp   . Anxiety   . Arthritis   . Ascending aortic aneurysm ([place])    note per chart per Dr Lucianne Lei Tright 4.7 cm 04/15/2015   . B12 deficiency   . Cancer ([place])   . Cataracts, both eyes 10/2006  . Constipation   . Depression   . DUB (dysfunctional uterine bleeding) 10/96  . ETD (eustachian tube dysfunction)   . Fall   . Fibromyalgia   . GERD (gastroesophageal reflux disease)   . Glaucoma    (SE) Dr. Arnoldo Morale   . Glaucoma    bilaterally  . Helicobacter pylori (H. pylori)   . Hiatal hernia   . History of bronchitis   . History of frequent urinary tract infections   . History of left shoulder fracture    pt states fell off bed and broke ball in shoulder had rod placed   . Hyperlipidemia   . Hypertension   . Insomnia   . Leg wound, left    healed   . MVP (mitral valve prolapse)   . Occasional tremors    left arm   . Osteoarthritis   . Osteopenia   . Osteoporosis   . Other and unspecified hyperlipidemia   . Post-menopausal   . Thyroid cancer ([place])   . Thyroid nodule   . Tremor     Past Surgical History:  Procedure Laterality Date  . APPENDECTOMY    . BUNIONECTOMY  08/1999   right - Dr. Irving Shows   . CHOLECYSTECTOMY  1979  . DILATION AND CURETTAGE OF UTERUS   03/31/95   Dr. Ovid Curd   . EYE SURGERY     also had left cataract removed   . FRACTURE SURGERY    . ORIF HUMERUS FRACTURE  05/30/2011   Procedure: OPEN REDUCTION INTERNAL FIXATION (ORIF) PROXIMAL HUMERUS FRACTURE;  Surgeon: Augustin Schooling;  Location: [place];  Service: Orthopedics;  Laterality: Left;  open reduction internal fixation of proximal humerus fracture  . right knee replacement  3/11   Dr. Gladstone Pih  . rt. eye cataract  05/28/07  . THYROID LOBECTOMY Left 07/27/2015   Procedure: LEFT THYROID LOBECTOMY;  Surgeon: Fanny Skates, MD;  Location: [facility];  Service: General;  Laterality: Left;  . THYROIDECTOMY N/A 08/06/2015   Procedure: RIGHT THYROID LOBECTOMY, REIMPLANTATION PARATHYROID;  Surgeon: Fanny Skates, MD;  Location: [facility];  Service: General;  Laterality: N/A;  . VENTRAL HERNIA REPAIR  2/99    There were no vitals filed for this visit.       Subjective Assessment - 06/22/16 1247    Subjective The patient has had ongoing right shoulder pain and that has gotten worse overe the last 3  months.   Patient Stated Goals Get out of pain.   Currently in Pain? Yes   Pain Score 8    Pain Location Shoulder   Pain Orientation Right   Pain Descriptors / Indicators Aching   Pain Type Acute pain   Pain Onset More than a month ago   Pain Frequency Constant   Aggravating Factors  Certain right shoulder movement.   Pain Relieving Factors Rest.            [facility] PT Assessment - 06/22/16 0001      Assessment   Medical Diagnosis Right shoulder pain.   Referring Provider Redge Gainer MD   Onset Date/Surgical Date --  3 months.     Precautions   Precautions None     Restrictions   Weight Bearing Restrictions No     Balance Screen   Has the patient fallen in the past 6 months No   Has the patient had a decrease in activity level because of a fear of falling?  Yes   Is the patient reluctant to leave their home because of a fear of falling?  No     Home Environment   Living  Environment Private residence     Prior Function   Level of Independence Independent     Posture/Postural Control   Posture/Postural Control Postural limitations   Postural Limitations Rounded Shoulders;Forward head     ROM / Strength   AROM / PROM / Strength AROM;Strength     AROM   Overall AROM Comments Right shoulder active flexion= 120 degrees and passive= 130 degrees; ER= 65 degrees and passive= 72 degrees; behind back only to right hip region.     Strength   Overall Strength Comments Right shoulder IR/ER= 4 to 4+/5.     Palpation   Palpation comment Tender to palpation in posterior cuff region.     Ambulation/Gait   Gait Comments WNL.                   [facility] Adult PT Treatment/Exercise - 06/22/16 0001      Modalities   Modalities Electrical Stimulation     Electrical Stimulation   Electrical Stimulation Location Right shoulder.   Electrical Stimulation Action IFC   Electrical Stimulation Parameters 80-150 Hz x 15 minutes.   Electrical Stimulation Goals Pain                  PT Short Term Goals - 06/22/16 1512      PT SHORT TERM GOAL #1   Title STG's=LTG's.           PT Long Term Goals - 06/22/16 1512      PT LONG TERM GOAL #1   Title Independent with a HEP.   Time 8   Period Weeks   Status New     PT LONG TERM GOAL #2   Title Active right shoulder flexion to 145 degrees so the patient can easily reach overhead   Time 8   Period Weeks   Status New     PT LONG TERM GOAL #3   Title Active ER to 70 degrees+ to allow for easily donning/doffing of apparel   Time 8   Period Weeks   Status New     PT LONG TERM GOAL #4   Title Increase ROM so patient is able to reach behind back to L4.   Time 8   Period Weeks   Status New     PT  LONG TERM GOAL #5   Title Increase right shoulder strength to a solid 4+/5 to increase stability for performance of functional activities   Time 8   Period Weeks   Status New                Plan - 06/22/16 1431    Clinical Impression Statement The patient has a loss of right shoulder motion that limites her ability to perform ADL's.   Rehab Potential Good   PT Frequency 2x / week   PT Duration 8 weeks   PT Treatment/Interventions ADLs/Self Care Home Management;Cryotherapy;Wellsite geologist;Therapeutic activities;Therapeutic exercise;Patient/family education;Manual techniques;Passive range of motion   PT Next Visit Plan PROM right shoulder; modalites and STW/M; isometrics, progress to yellow theraband; UE Ranger; pulleys.      Patient will benefit from skilled therapeutic intervention in order to improve the following deficits and impairments:  Pain, Decreased activity tolerance, Decreased range of motion  Visit Diagnosis: Acute pain of right shoulder - Plan: PT plan of care cert/re-cert  Stiffness of right shoulder, not elsewhere classified - Plan: PT plan of care cert/re-cert      G-Codes - [id number]    Functional Assessment Tool Used Clinical judgement...   Functional Limitation Self care   Self Care Current Status [id number]) At least 40 percent but less than 60 percent impaired, limited or restricted   Self Care Goal Status [id number]) At least 1 percent but less than 20 percent impaired, limited or restricted       Problem List Patient Active Problem List   Diagnosis Date Noted  . Vitamin B 12 deficiency 03/01/2016  . Ascending aortic aneurysm ([place]) 08/20/2015  . Thyroid cancer ([place]) 07/30/2015  . Left thyroid nodule 07/27/2015  . Osteoporosis with fracture 04/16/2014  . At high risk for falls 04/16/2014  . Proximal humerus fracture 05/30/2011  . CHEST PAIN, PRECORDIAL 02/26/2010  . Hyperlipidemia 02/23/2010  . Depression 02/23/2010  . GLAUCOMA 02/23/2010  . CATARACTS 02/23/2010  . RHEUMATIC FEVER 02/23/2010  . Essential hypertension 02/23/2010  . MITRAL VALVE PROLAPSE 02/23/2010  . GERD 02/23/2010  . Osteoarthritis  02/23/2010  . Primary fibromyalgia syndrome 02/23/2010    [place], [place] MPT 06/22/2016, 3:22 PM  [facility] [address] [place], [place], [address] Phone: [phone]   Fax:  [phone]  Name: [name] MRN: [id number] Date of Birth: 19-Dec-1940

## 2016-06-28 ENCOUNTER — Ambulatory Visit: Payer: Medicare Other | Attending: Family Medicine | Admitting: Physical Therapy

## 2016-06-28 DIAGNOSIS — M25611 Stiffness of right shoulder, not elsewhere classified: Secondary | ICD-10-CM | POA: Diagnosis present

## 2016-06-28 DIAGNOSIS — M25511 Pain in right shoulder: Secondary | ICD-10-CM | POA: Diagnosis present

## 2016-06-28 NOTE — Therapy (Signed)
[facility] [place], [place], [address] Phone: [phone]   Fax:  [phone]  Physical Therapy Treatment  Patient Details  Name: [name] MRN: [id number] Date of Birth: 08-06-1940 Referring Provider: Redge Gainer MD  Encounter Date: 06/28/2016      PT End of Session - 06/28/16 0952    Visit Number 2   Number of Visits 16   Date for PT Re-Evaluation 08/21/16   PT Start Time 0952   PT Stop Time 1045   PT Time Calculation (min) 53 min   Activity Tolerance Patient tolerated treatment well   Behavior During Therapy [facility] for tasks assessed/performed      Past Medical History:  Diagnosis Date  . ACL tear    right  Dr. Gladstone Harmon   . Adenomatous polyp   . Anxiety   . Arthritis   . Ascending aortic aneurysm ([place])    note per chart per Dr Terri Lei Harmon 4.7 cm 04/15/2015   . B12 deficiency   . Cancer ([place])   . Cataracts, both eyes 10/2006  . Constipation   . Depression   . DUB (dysfunctional uterine bleeding) 10/96  . ETD (eustachian tube dysfunction)   . Fall   . Fibromyalgia   . GERD (gastroesophageal reflux disease)   . Glaucoma    (SE) Dr. Arnoldo Harmon   . Glaucoma    bilaterally  . Helicobacter pylori (H. pylori)   . Hiatal hernia   . History of bronchitis   . History of frequent urinary tract infections   . History of left shoulder fracture    pt states fell off bed and broke ball in shoulder had rod placed   . Hyperlipidemia   . Hypertension   . Insomnia   . Leg wound, left    healed   . MVP (mitral valve prolapse)   . Occasional tremors    left arm   . Osteoarthritis   . Osteopenia   . Osteoporosis   . Other and unspecified hyperlipidemia   . Post-menopausal   . Thyroid cancer ([place])   . Thyroid nodule   . Tremor     Past Surgical History:  Procedure Laterality Date  . APPENDECTOMY    . BUNIONECTOMY  08/1999   right - Dr. Irving Harmon   . CHOLECYSTECTOMY  1979  . DILATION AND CURETTAGE OF UTERUS   03/31/95   Dr. Ovid Harmon   . EYE SURGERY     also had left cataract removed   . FRACTURE SURGERY    . ORIF HUMERUS FRACTURE  05/30/2011   Procedure: OPEN REDUCTION INTERNAL FIXATION (ORIF) PROXIMAL HUMERUS FRACTURE;  Surgeon: [name];  Location: [facility];  Service: Orthopedics;  Laterality: Left;  open reduction internal fixation of proximal humerus fracture  . right knee replacement  3/11   Dr. Gladstone Harmon  . rt. eye cataract  05/28/07  . THYROID LOBECTOMY Left 07/27/2015   Procedure: LEFT THYROID LOBECTOMY;  Surgeon: Terri Skates, MD;  [facility];  Service: General;  Laterality: Left;  . THYROIDECTOMY N/A 08/06/2015   Procedure: RIGHT THYROID LOBECTOMY, REIMPLANTATION PARATHYROID;  Surgeon: Terri Skates, MD;  [facility];  Service: General;  Laterality: N/A;  . VENTRAL HERNIA REPAIR  2/99    There were no vitals filed for this visit.      Subjective Assessment - 06/28/16 0952    Subjective Patient reports she feels less pain since her last visit. Biggest problem is reaching behind her back.  Patient Stated Goals Get out of pain.   Currently in Pain? Yes   Pain Score 5    Pain Location Shoulder   Pain Orientation Right   Pain Descriptors / Indicators Aching   Pain Type Acute pain   Pain Onset More than a month ago   Pain Frequency Intermittent   Aggravating Factors  certain movements   Pain Relieving Factors rest                         [facility] Adult PT Treatment/Exercise - 06/28/16 0001      Exercises   Exercises Shoulder     Shoulder Exercises: Supine   Flexion Strengthening;Right   Other Supine Exercises scaption x 7 reps then started to hurt     Shoulder Exercises: Sidelying   External Rotation Strengthening;20 reps;Weights   External Rotation Limitations 1     Shoulder Exercises: Standing   Flexion Limitations pulling with shoulder height  also with scaption     Shoulder Exercises: Pulleys   Flexion 3 minutes  2 min scaption also    ABduction Limitations pain with abduction   Other Pulley Exercises UE ranger x 7 (painful arc with flexion)     Shoulder Exercises: Isometric Strengthening   Flexion 5X10"   Extension 5X10"   External Rotation 5X10"   Internal Rotation 5X10"   ABduction 5X10"     Modalities   Modalities Electrical Stimulation     Electrical Stimulation   Electrical Stimulation Location R shoulder IFC 80-150 Hz x 15 min to tolerance   Electrical Stimulation Goals Pain     Manual Therapy   Manual Therapy Soft tissue mobilization;Passive ROM   Soft tissue mobilization to R infraspinatus and lats    Passive ROM flexion/scaption/IR/ER                  PT Short Term Goals - 06/22/16 1512      PT SHORT TERM GOAL #1   Title STG's=LTG's.           PT Long Term Goals - 06/22/16 1512      PT LONG TERM GOAL #1   Title Independent with a HEP.   Time 8   Period Weeks   Status New     PT LONG TERM GOAL #2   Title Active right shoulder flexion to 145 degrees so the patient can easily reach overhead   Time 8   Period Weeks   Status New     PT LONG TERM GOAL #3   Title Active ER to 70 degrees+ to allow for easily donning/doffing of apparel   Time 8   Period Weeks   Status New     PT LONG TERM GOAL #4   Title Increase ROM so patient is able to reach behind back to L4.   Time 8   Period Weeks   Status New     PT LONG TERM GOAL #5   Title Increase right shoulder strength to a solid 4+/5 to increase stability for performance of functional activities   Time 8   Period Weeks   Status New               Plan - 06/28/16 1145    Clinical Impression Statement Patient did well with TE today. She had no pain with isometrics other than a mild pull with ABD. She also tolerated supine strengthening well. She fatigued easily with scaption and SDLY ER. She has  trigger points in her infraspinatus. No goals have been met as only second visit.    Rehab Potential Good   PT Frequency  2x / week   PT Duration 8 weeks   PT Treatment/Interventions ADLs/Self Care Home Management;Cryotherapy;Wellsite geologist;Therapeutic activities;Therapeutic exercise;Patient/family education;Manual techniques;Passive range of motion   PT Next Visit Plan PROM right shoulder; modalites and STW/M; continue with supine/SDLY TE, add prone and progress to yellow theraband; UE Ranger; pulleys.   Consulted and Agree with Plan of Care Patient      Patient will benefit from skilled therapeutic intervention in order to improve the following deficits and impairments:  Pain, Decreased activity tolerance, Decreased range of motion  Visit Diagnosis: Acute pain of right shoulder  Stiffness of right shoulder, not elsewhere classified     Problem List Patient Active Problem List   Diagnosis Date Noted  . Vitamin B 12 deficiency 03/01/2016  . Ascending aortic aneurysm ([place]) 08/20/2015  . Thyroid cancer ([place]) 07/30/2015  . Left thyroid nodule 07/27/2015  . Osteoporosis with fracture 04/16/2014  . At high risk for falls 04/16/2014  . Proximal humerus fracture 05/30/2011  . CHEST PAIN, PRECORDIAL 02/26/2010  . Hyperlipidemia 02/23/2010  . Depression 02/23/2010  . GLAUCOMA 02/23/2010  . CATARACTS 02/23/2010  . RHEUMATIC FEVER 02/23/2010  . Essential hypertension 02/23/2010  . MITRAL VALVE PROLAPSE 02/23/2010  . GERD 02/23/2010  . Osteoarthritis 02/23/2010  . Primary fibromyalgia syndrome 02/23/2010    Madelyn Flavors PT 06/28/2016, 11:50 AM  [facility] [address] [place], [place], [address] Phone: [phone]   Fax:  [phone]  Name: [name] MRN: [id number] Date of Birth: [birth year]

## 2016-06-29 ENCOUNTER — Encounter: Payer: Self-pay | Admitting: Family Medicine

## 2016-06-29 ENCOUNTER — Ambulatory Visit (INDEPENDENT_AMBULATORY_CARE_PROVIDER_SITE_OTHER): Payer: Medicare Other | Admitting: Family Medicine

## 2016-06-29 VITALS — BP 109/59 | HR 64 | Temp 97.1°F | Ht 65.0 in | Wt 178.0 lb

## 2016-06-29 DIAGNOSIS — S81802A Unspecified open wound, left lower leg, initial encounter: Secondary | ICD-10-CM

## 2016-06-29 MED ORDER — CEPHALEXIN 500 MG PO CAPS: 500.0000 mg | ORAL_CAPSULE | Freq: Three times a day (TID) | ORAL | 0 refills | Status: DC

## 2016-06-29 NOTE — Patient Instructions (Addendum)
The patient should elevate the leg as much as possible She should apply saline compresses to the wound  a couple times during the day and wrap with kling dressing Take antibiotic as directed We will use wet to dry dressings. Recheck wound in 10 days to 2 weeks.

## 2016-06-29 NOTE — Progress Notes (Signed)
Subjective:    Patient ID: [name], female    DOB: 01-15-1941, 76 y.o.   MRN: [id number]  HPI Patient here today for a wound on her left lower leg. A wooden step fell on her leg right before christmas in her out building.There is a circular area about the size of a silver dollar on the medial right lower leg there is minimal erythema and some slight swelling distally. The wound edges appear to be good and there is no purulence draining from the wound.The patient is up-to-date on her tetanus vaccine.     Patient Active Problem List   Diagnosis Date Noted  . Vitamin B 12 deficiency 03/01/2016  . Ascending aortic aneurysm ([place]) 08/20/2015  . Thyroid cancer ([place]) 07/30/2015  . Left thyroid nodule 07/27/2015  . Osteoporosis with fracture 04/16/2014  . At high risk for falls 04/16/2014  . Proximal humerus fracture 05/30/2011  . CHEST PAIN, PRECORDIAL 02/26/2010  . Hyperlipidemia 02/23/2010  . Depression 02/23/2010  . GLAUCOMA 02/23/2010  . CATARACTS 02/23/2010  . RHEUMATIC FEVER 02/23/2010  . Essential hypertension 02/23/2010  . MITRAL VALVE PROLAPSE 02/23/2010  . GERD 02/23/2010  . Osteoarthritis 02/23/2010  . Primary fibromyalgia syndrome 02/23/2010   Outpatient Encounter Prescriptions as of 06/29/2016  Medication Sig  . aspirin 81 MG tablet Take 1 tablet (81 mg total) by mouth every other day.  Marland Kitchen atenolol (TENORMIN) 50 MG tablet TAKE 1 TABLET DAILY  . calcium carbonate (OS-CAL - DOSED IN MG OF ELEMENTAL CALCIUM) 1250 (500 Ca) MG tablet Take 2 tablets (1,000 mg of elemental calcium total) by mouth 3 (three) times daily with meals. (Patient taking differently: Take 2 tablets by mouth 3 (three) times daily with meals. 5 a day)  . celecoxib (CELEBREX) 200 MG capsule TAKE (1) CAPSULE DAILY  . clorazepate (TRANXENE) 7.5 MG tablet TAKE 1 TABLET DAILY AS NEEDED (Patient taking differently: TAKE 1 TABLET DAILY AS NEEDED FOR ANXIETY)  . fluticasone (FLONASE) 50 MCG/ACT nasal spray Place  2 sprays into the nose daily. (Patient taking differently: Place 2 sprays into the nose daily as needed for allergies. )  . gabapentin (NEURONTIN) 100 MG capsule Take 1-2 capsules QHS as directed  . hydrocortisone (PROCTOZONE-HC) 2.5 % rectal cream APPLY TO RECTALLY 2 TIMES A DAY AS NEEDED  . levothyroxine (SYNTHROID, LEVOTHROID) 88 MCG tablet Take 1 tablet by mouth daily.  . Multiple Vitamins-Minerals (SENIOR MULTIVITAMIN PLUS PO) Take by mouth daily.    . Omega-3 Fatty Acids (FISH OIL PO) Take 1 capsule by mouth daily.  . simvastatin (ZOCOR) 80 MG tablet TAKE ONE TABLET AT BEDTIME  . TRAVATAN Z 0.004 % SOLN ophthalmic solution INSTILL 1 DROP INTO RIGHT EYE AT BEDTIME  . venlafaxine XR (EFFEXOR-XR) 75 MG 24 hr capsule TAKE (1) CAPSULE DAILY  . Vitamin D, Ergocalciferol, (DRISDOL) 50000 units CAPS capsule Take 50,000 Units by mouth every 7 (seven) days.  Marland Kitchen zolpidem (AMBIEN) 10 MG tablet TAKE 1/2 TABLET AT BEDTIME AS NEEDED   Facility-Administered Encounter Medications as of 06/29/2016  Medication  . cyanocobalamin ((VITAMIN B-12)) injection 1,000 mcg      Review of Systems  Constitutional: Negative.   HENT: Negative.   Eyes: Negative.   Respiratory: Negative.   Cardiovascular: Negative.   Gastrointestinal: Negative.   Endocrine: Negative.   Genitourinary: Negative.   Musculoskeletal: Negative.   Skin: Positive for wound (left lower leg wound).  Allergic/Immunologic: Negative.   Neurological: Negative.   Hematological: Negative.   Psychiatric/Behavioral: Negative.  Objective:   Physical Exam  Constitutional: She is oriented to person, place, and time. She appears well-developed and well-nourished.  HENT:  Head: Normocephalic.  Eyes: Conjunctivae and EOM are normal. Pupils are equal, round, and reactive to light. Right eye exhibits no discharge. Left eye exhibits no discharge.  Neck: Normal range of motion.  Musculoskeletal: Normal range of motion. She exhibits edema.    Slight edema distal to the wound of the leg.  Neurological: She is alert and oriented to person, place, and time.  Skin: Skin is warm and dry. There is erythema.  The wound is about the size of a silver dollar. The central tissue appears healthy. The wound edges are good. There is no purulence.  Psychiatric: She has a normal mood and affect. Her behavior is normal. Judgment and thought content normal.  Nursing note and vitals reviewed.  BP (!) 109/59 (BP Location: Left Arm)   Pulse 64   Temp 97.1 F (36.2 C) (Oral)   Ht 5\' 5"  (1.651 m)   Wt 178 lb (80.7 kg)   BMI 29.62 kg/m         Assessment & Plan:  1. Wound of left lower extremity, initial encounter -This wound occurred before Christmas. -The patient will continue with saline compresses twice daily and elevation as much as possible -She will take Keflex 500 mg 3 times a day for 10 days  Patient Instructions  The patient should elevate the leg as much as possible She should apply saline compresses to the wound  a couple times during the day and wrap with kling dressing Take antibiotic as directed We will use wet to dry dressings. Recheck wound in 10 days to 2 weeks.  Arrie Senate MD

## 2016-06-30 ENCOUNTER — Encounter: Payer: Medicare Other | Admitting: Physical Therapy

## 2016-06-30 DIAGNOSIS — M25511 Pain in right shoulder: Secondary | ICD-10-CM | POA: Diagnosis not present

## 2016-06-30 DIAGNOSIS — M25611 Stiffness of right shoulder, not elsewhere classified: Secondary | ICD-10-CM | POA: Diagnosis not present

## 2016-07-01 ENCOUNTER — Ambulatory Visit: Payer: Medicare Other | Admitting: Physical Therapy

## 2016-07-01 DIAGNOSIS — M25611 Stiffness of right shoulder, not elsewhere classified: Secondary | ICD-10-CM

## 2016-07-01 DIAGNOSIS — M25511 Pain in right shoulder: Secondary | ICD-10-CM | POA: Diagnosis not present

## 2016-07-01 NOTE — Therapy (Addendum)
[facility] [place], [place], [address] Phone: [phone]   Fax:  [phone]  Physical Therapy Treatment  Patient Details  Name: [name] MRN: [id number] Date of Birth: 01-14-1941 Referring Provider: Redge Gainer MD   Encounter Date: 07/01/2016    Past Medical History:  Diagnosis Date  . ACL tear    right  Dr. Gladstone Pih   . Adenomatous polyp   . Anxiety   . Aorta aneurysm ([facility])   . Arthritis   . Ascending aortic aneurysm ([place])    note per chart per Dr Lucianne Lei Tright 4.7 cm 04/15/2015   . B12 deficiency   . Cancer ([place])   . Cataracts, both eyes 10/2006  . Chronic bronchitis ([place])   . Constipation   . Depression   . DUB (dysfunctional uterine bleeding) 10/96  . ETD (eustachian tube dysfunction)   . Fall   . Fibromyalgia   . GERD (gastroesophageal reflux disease)   . Glaucoma    ([facility]) Dr. Arnoldo Morale   . Glaucoma    bilaterally  . Helicobacter pylori (H. pylori)   . Hiatal hernia   . History of bronchitis   . History of frequent urinary tract infections   . History of left shoulder fracture    pt states fell off bed and broke ball in shoulder had rod placed   . Hyperlipidemia   . Hypertension   . Insomnia   . Leg wound, left    healed   . MVP (mitral valve prolapse)   . Occasional tremors    left arm   . Osteoarthritis   . Osteopenia   . Osteoporosis   . Other and unspecified hyperlipidemia   . Post-menopausal   . Thyroid cancer ([place])   . Thyroid nodule   . Tremor     Past Surgical History:  Procedure Laterality Date  . APPENDECTOMY    . BUNIONECTOMY  08/1999   right - Dr. Irving Shows   . CHOLECYSTECTOMY  1979  . DILATION AND CURETTAGE OF UTERUS  03/31/95   Dr. Ovid Curd   . EYE SURGERY     also had left cataract removed   . FRACTURE SURGERY    . right knee replacement  3/11   Dr. Gladstone Pih  . rt. eye cataract  05/28/07  . VENTRAL HERNIA REPAIR  2/99    There were no vitals filed for this  visit.                             PT Short Term Goals - 06/22/16 1512      PT SHORT TERM GOAL #1   Title  STG's=LTG's.        PT Long Term Goals - 06/22/16 1512      PT LONG TERM GOAL #1   Title  Independent with a HEP.    Time  8    Period  Weeks    Status  New      PT LONG TERM GOAL #2   Title  Active right shoulder flexion to 145 degrees so the patient can easily reach overhead    Time  8    Period  Weeks    Status  New      PT LONG TERM GOAL #3   Title  Active ER to 70 degrees+ to allow for easily donning/doffing of apparel    Time  8    Period  Weeks  Status  New      PT LONG TERM GOAL #4   Title  Increase ROM so patient is able to reach behind back to L4.    Time  8    Period  Weeks    Status  New      PT LONG TERM GOAL #5   Title  Increase right shoulder strength to a solid 4+/5 to increase stability for performance of functional activities    Time  8    Period  Weeks    Status  New              Patient will benefit from skilled therapeutic intervention in order to improve the following deficits and impairments:  Pain, Decreased activity tolerance, Decreased range of motion  Visit Diagnosis: Stiffness of right shoulder, not elsewhere classified     Problem List Patient Active Problem List   Diagnosis Date Noted  . Nephrolithiasis 04/07/2017  . Vitamin B 12 deficiency 03/01/2016  . Ascending aortic aneurysm ([facility] [facility]) 08/20/2015  . Thyroid cancer ([facility]) 07/30/2015  . Left thyroid nodule 07/27/2015  . Osteoporosis with fracture 04/16/2014  . At high risk for falls 04/16/2014  . Proximal humerus fracture 05/30/2011  . CHEST PAIN, PRECORDIAL 02/26/2010  . Hyperlipidemia 02/23/2010  . Depression 02/23/2010  . GLAUCOMA 02/23/2010  . CATARACTS 02/23/2010  . RHEUMATIC FEVER 02/23/2010  . Essential hypertension 02/23/2010  . MITRAL VALVE PROLAPSE 02/23/2010  . GERD 02/23/2010  . Osteoarthritis 02/23/2010  . Primary  fibromyalgia syndrome 02/23/2010    Madelyn Flavors PT 05/05/2017, 9:52 AM   PHYSICAL THERAPY DISCHARGE SUMMARY  Visits from Start of Care: 3.  Current functional level related to goals / functional outcomes: See above.  Remaining deficits: See above.   Education / Equipment: See above. Plan: Patient agrees to discharge.  Patient goals were not met. Patient is being discharged due to meeting the stated rehab goals.  ?????      [facility] [place], [place], [address] Phone: [phone]   Fax:  [phone]  Name: [name] MRN: [id number] Date of Birth: 06/20/1941

## 2016-07-05 ENCOUNTER — Ambulatory Visit: Payer: Medicare Other | Admitting: Physical Therapy

## 2016-07-08 ENCOUNTER — Encounter: Payer: Medicare Other | Admitting: *Deleted

## 2016-07-13 ENCOUNTER — Ambulatory Visit: Payer: Medicare Other | Admitting: Family Medicine

## 2016-07-14 ENCOUNTER — Other Ambulatory Visit: Payer: Self-pay | Admitting: Family Medicine

## 2016-07-15 ENCOUNTER — Ambulatory Visit (INDEPENDENT_AMBULATORY_CARE_PROVIDER_SITE_OTHER): Payer: Medicare Other | Admitting: *Deleted

## 2016-07-15 DIAGNOSIS — E538 Deficiency of other specified B group vitamins: Secondary | ICD-10-CM | POA: Diagnosis not present

## 2016-07-15 NOTE — Progress Notes (Signed)
Vit B12 inj given Pt tolerated well 

## 2016-07-20 ENCOUNTER — Encounter: Payer: Self-pay | Admitting: Family Medicine

## 2016-07-20 ENCOUNTER — Ambulatory Visit (INDEPENDENT_AMBULATORY_CARE_PROVIDER_SITE_OTHER): Payer: Medicare Other | Admitting: Family Medicine

## 2016-07-20 VITALS — BP 116/62 | HR 65 | Temp 96.8°F | Ht 65.0 in | Wt 180.0 lb

## 2016-07-20 DIAGNOSIS — S81802D Unspecified open wound, left lower leg, subsequent encounter: Secondary | ICD-10-CM

## 2016-07-20 DIAGNOSIS — S81802A Unspecified open wound, left lower leg, initial encounter: Secondary | ICD-10-CM

## 2016-07-20 MED ORDER — HYDROCODONE-HOMATROPINE 5-1.5 MG/5ML PO SYRP: 5.0000 mL | ORAL_SOLUTION | Freq: Every evening | ORAL | 0 refills | Status: DC | PRN

## 2016-07-20 NOTE — Patient Instructions (Signed)
Apply saline wet-to-dry twice daily Cleanse wound after getting out of the shower gently and then apply saline as directed If there is any worsening redness or erythema call us back and be seen sooner Elevate the leg when possible

## 2016-07-20 NOTE — Progress Notes (Signed)
Subjective:    Patient ID: [name], female    DOB: 10/28/1940, 76 y.o.   MRN: [id number]  HPI Patient here today for 3 week follow up on left lower extremity wound and swelling. The swelling is still there, the wound is some better. The initial visit was on January 3. The wound occurred right before Christmas and one of her outside buildings.   Patient Active Problem List   Diagnosis Date Noted  . Vitamin B 12 deficiency 03/01/2016  . Ascending aortic aneurysm ([place]) 08/20/2015  . Thyroid cancer ([facility]) 07/30/2015  . Left thyroid nodule 07/27/2015  . Osteoporosis with fracture 04/16/2014  . At high risk for falls 04/16/2014  . Proximal humerus fracture 05/30/2011  . CHEST PAIN, PRECORDIAL 02/26/2010  . Hyperlipidemia 02/23/2010  . Depression 02/23/2010  . GLAUCOMA 02/23/2010  . CATARACTS 02/23/2010  . RHEUMATIC FEVER 02/23/2010  . Essential hypertension 02/23/2010  . MITRAL VALVE PROLAPSE 02/23/2010  . GERD 02/23/2010  . Osteoarthritis 02/23/2010  . Primary fibromyalgia syndrome 02/23/2010   Outpatient Encounter Prescriptions as of 07/20/2016  Medication Sig  . aspirin 81 MG tablet Take 1 tablet (81 mg total) by mouth every other day.  Marland Kitchen atenolol (TENORMIN) 50 MG tablet TAKE 1 TABLET DAILY  . calcium carbonate (OS-CAL - DOSED IN MG OF ELEMENTAL CALCIUM) 1250 (500 Ca) MG tablet Take 2 tablets (1,000 mg of elemental calcium total) by mouth 3 (three) times daily with meals. (Patient taking differently: Take 2 tablets by mouth 3 (three) times daily with meals. 5 a day)  . celecoxib (CELEBREX) 200 MG capsule TAKE (1) CAPSULE DAILY  . clorazepate (TRANXENE) 7.5 MG tablet TAKE 1 TABLET DAILY AS NEEDED (Patient taking differently: TAKE 1 TABLET DAILY AS NEEDED FOR ANXIETY)  . fluticasone (FLONASE) 50 MCG/ACT nasal spray Place 2 sprays into the nose daily. (Patient taking differently: Place 2 sprays into the nose daily as needed for allergies. )  . gabapentin (NEURONTIN) 100 MG  capsule Take 1-2 capsules QHS as directed  . hydrocortisone (PROCTOZONE-HC) 2.5 % rectal cream APPLY TO RECTALLY 2 TIMES A DAY AS NEEDED  . levothyroxine (SYNTHROID, LEVOTHROID) 88 MCG tablet Take 1 tablet by mouth daily.  . Multiple Vitamins-Minerals (SENIOR MULTIVITAMIN PLUS PO) Take by mouth daily.    . Omega-3 Fatty Acids (FISH OIL PO) Take 1 capsule by mouth daily.  . simvastatin (ZOCOR) 80 MG tablet TAKE ONE TABLET AT BEDTIME  . TRAVATAN Z 0.004 % SOLN ophthalmic solution INSTILL 1 DROP INTO RIGHT EYE AT BEDTIME  . venlafaxine XR (EFFEXOR-XR) 75 MG 24 hr capsule TAKE (1) CAPSULE DAILY  . Vitamin D, Ergocalciferol, (DRISDOL) 50000 units CAPS capsule Take 50,000 Units by mouth every 7 (seven) days.  Marland Kitchen zolpidem (AMBIEN) 10 MG tablet TAKE 1/2 TABLET AT BEDTIME AS NEEDED  . [DISCONTINUED] cephALEXin (KEFLEX) 500 MG capsule Take 1 capsule (500 mg total) by mouth 3 (three) times daily.   Facility-Administered Encounter Medications as of 07/20/2016  Medication  . cyanocobalamin ((VITAMIN B-12)) injection 1,000 mcg      Review of Systems  Constitutional: Negative.   HENT: Negative.   Eyes: Negative.   Respiratory: Negative.   Cardiovascular: Positive for leg swelling (left lower leg).  Gastrointestinal: Negative.   Endocrine: Negative.   Genitourinary: Negative.   Musculoskeletal: Negative.   Skin: Negative.   Allergic/Immunologic: Negative.   Neurological: Negative.   Hematological: Negative.   Psychiatric/Behavioral: Negative.        Objective:   Physical Exam BP  116/62 (BP Location: Left Arm)   Pulse 65   Temp (!) 96.8 F (36 C) (Oral)   Ht 5\' 5"  (1.651 m)   Wt 180 lb (81.6 kg)   BMI 29.95 kg/m   The wound looks healthy and appears to be healing although the process is slow. There is minimal swelling distal to the wound. There is minimal drainage or discharge.      Assessment & Plan:  1. Wound of left lower extremity, initial encounter -Continue to clean wound and  apply saline wet-to-dry twice daily elevate leg when possible and as much as possible. -Call sooner for recheck if any worsening erythema and redness  Patient Instructions  Apply saline wet-to-dry twice daily Cleanse wound after getting out of the shower gently and then apply saline as directed If there is any worsening redness or erythema call us back and be seen sooner Elevate the leg when possible  Arrie Senate MD

## 2016-07-20 NOTE — Pre-Procedure Instructions (Signed)
pic added to epic - left lower leg wound

## 2016-07-20 NOTE — Addendum Note (Signed)
Addended by: Zannie Cove on: 07/20/2016 08:08 AM   Modules accepted: Orders

## 2016-07-20 NOTE — Pre-Procedure Instructions (Signed)
Picture added to epic - of left lower ext wound

## 2016-07-22 ENCOUNTER — Other Ambulatory Visit: Payer: Medicare Other

## 2016-07-22 DIAGNOSIS — Z1211 Encounter for screening for malignant neoplasm of colon: Secondary | ICD-10-CM | POA: Diagnosis not present

## 2016-07-23 ENCOUNTER — Other Ambulatory Visit: Payer: Self-pay | Admitting: Family Medicine

## 2016-07-24 LAB — FECAL OCCULT BLOOD, IMMUNOCHEMICAL: FECAL OCCULT BLD: NEGATIVE

## 2016-07-26 ENCOUNTER — Other Ambulatory Visit: Payer: Medicare Other

## 2016-07-26 DIAGNOSIS — R195 Other fecal abnormalities: Secondary | ICD-10-CM

## 2016-07-27 LAB — CBC WITH DIFFERENTIAL/PLATELET
BASOS ABS: 0 10*3/uL (ref 0.0–0.2)
Basos: 1 %
EOS (ABSOLUTE): 0.2 10*3/uL (ref 0.0–0.4)
EOS: 5 %
HEMATOCRIT: 35.5 % (ref 34.0–46.6)
HEMOGLOBIN: 12.2 g/dL (ref 11.1–15.9)
Immature Grans (Abs): 0 10*3/uL (ref 0.0–0.1)
Immature Granulocytes: 0 %
Lymphocytes Absolute: 1.7 10*3/uL (ref 0.7–3.1)
Lymphs: 35 %
MCH: 29.5 pg (ref 26.6–33.0)
MCHC: 34.4 g/dL (ref 31.5–35.7)
MCV: 86 fL (ref 79–97)
MONOCYTES: 8 %
Monocytes Absolute: 0.4 10*3/uL (ref 0.1–0.9)
NEUTROS ABS: 2.5 10*3/uL (ref 1.4–7.0)
Neutrophils: 51 %
Platelets: 239 10*3/uL (ref 150–379)
RBC: 4.14 x10E6/uL (ref 3.77–5.28)
RDW: 14.3 % (ref 12.3–15.4)
WBC: 4.8 10*3/uL (ref 3.4–10.8)

## 2016-08-02 ENCOUNTER — Other Ambulatory Visit: Payer: Self-pay | Admitting: Nurse Practitioner

## 2016-08-11 ENCOUNTER — Encounter: Payer: Self-pay | Admitting: Family Medicine

## 2016-08-11 ENCOUNTER — Ambulatory Visit (INDEPENDENT_AMBULATORY_CARE_PROVIDER_SITE_OTHER): Payer: Medicare Other | Admitting: Family Medicine

## 2016-08-11 VITALS — BP 128/65 | HR 71 | Temp 97.0°F | Ht 65.0 in | Wt 178.8 lb

## 2016-08-11 DIAGNOSIS — S81802D Unspecified open wound, left lower leg, subsequent encounter: Secondary | ICD-10-CM | POA: Diagnosis not present

## 2016-08-11 NOTE — Progress Notes (Signed)
   BP 128/65   Pulse 71   Temp 97 F (36.1 C) (Oral)   Ht 5\' 5"  (1.651 m)   Wt 178 lb 12.8 oz (81.1 kg)   BMI 29.75 kg/m    Subjective:    Patient ID: [name], female    DOB: 1941/02/25, 76 y.o.   MRN: [id number]  HPI: [name] is a 76 y.o. female presenting on 08/11/2016 for left leg wound recheck   HPI Leg wound recheck Patient has been fighting a left leg wound that she sustained right around Christmas time just under a month ago. She has been doing wet-to-dry dressings and has not noticed it changing much. She denies any redness or warmth or fevers or chills. She has been having clear drainage out of the wounds. She has been having some swelling in that leg as well still.  Relevant past medical, surgical, family and social history reviewed and updated as indicated. Interim medical history since our last visit reviewed. Allergies and medications reviewed and updated.  Review of Systems  Constitutional: Negative for chills and fever.  Respiratory: Negative for chest tightness and shortness of breath.   Cardiovascular: Negative for chest pain and leg swelling.  Musculoskeletal: Negative for gait problem.  Skin: Positive for wound. Negative for rash.  All other systems reviewed and are negative.   Per HPI unless specifically indicated above      Objective:    BP 128/65   Pulse 71   Temp 97 F (36.1 C) (Oral)   Ht 5\' 5"  (1.651 m)   Wt 178 lb 12.8 oz (81.1 kg)   BMI 29.75 kg/m   Wt Readings from Last 3 Encounters:  08/11/16 178 lb 12.8 oz (81.1 kg)  07/20/16 180 lb (81.6 kg)  06/29/16 178 lb (80.7 kg)    Physical Exam  Constitutional: She appears well-developed and well-nourished. No distress.  Eyes: Conjunctivae are normal.  Musculoskeletal: Normal range of motion. She exhibits edema (1+ edema).  Neurological: She is alert. Coordination normal.  Skin: Skin is warm and dry. Lesion (3 cm x 3 cm lesion with good pink granular tissue base) noted. No rash  noted. She is not diaphoretic.  Psychiatric: She has a normal mood and affect. Her behavior is normal.  Nursing note and vitals reviewed.           Assessment & Plan:   Problem List Items Addressed This Visit    None    Visit Diagnoses    Wound of left lower extremity, subsequent encounter    -  Primary   Been doing wet-to-dry for long period time does not seem to be benefiting as much, we will do [employer]. Nurse applied [employer], we'll see back in 1 week       Follow up plan: Return in about 1 week (around 08/18/2016), or if symptoms worsen or fail to improve.  Counseling provided for all of the vaccine components No orders of the defined types were placed in this encounter.   Caryl Pina, MD [facility] 08/11/2016, 4:33 PM

## 2016-08-13 ENCOUNTER — Other Ambulatory Visit: Payer: Self-pay | Admitting: Family Medicine

## 2016-08-16 ENCOUNTER — Ambulatory Visit (INDEPENDENT_AMBULATORY_CARE_PROVIDER_SITE_OTHER): Payer: Medicare Other | Admitting: *Deleted

## 2016-08-16 DIAGNOSIS — E538 Deficiency of other specified B group vitamins: Secondary | ICD-10-CM

## 2016-08-16 NOTE — Progress Notes (Signed)
Pt given Vit B12 inj Tolerated well 

## 2016-08-19 ENCOUNTER — Ambulatory Visit: Payer: Medicare Other | Admitting: Family Medicine

## 2016-08-19 ENCOUNTER — Other Ambulatory Visit: Payer: Self-pay | Admitting: Family Medicine

## 2016-08-22 ENCOUNTER — Ambulatory Visit (INDEPENDENT_AMBULATORY_CARE_PROVIDER_SITE_OTHER): Payer: Medicare Other | Admitting: Family Medicine

## 2016-08-22 ENCOUNTER — Encounter: Payer: Self-pay | Admitting: Family Medicine

## 2016-08-22 VITALS — BP 127/63 | HR 58 | Temp 99.0°F | Ht 65.0 in | Wt 180.0 lb

## 2016-08-22 DIAGNOSIS — S81802D Unspecified open wound, left lower leg, subsequent encounter: Secondary | ICD-10-CM | POA: Diagnosis not present

## 2016-08-22 NOTE — Progress Notes (Signed)
   BP 127/63   Pulse (!) 58   Temp 99 F (37.2 C) (Oral)   Ht 5\' 5"  (1.651 m)   Wt 180 lb (81.6 kg)   BMI 29.95 kg/m    Subjective:    Patient ID: [name], female    DOB: Dec 01, 1940, 76 y.o.   MRN: [id number]  HPI: [name] is a 76 y.o. female presenting on 08/22/2016 for Followup wound on left lower leg   HPI Wound recheck Patient is coming in for wound recheck today. She has continued with the Unna boot for the past week and a half and feels like it is looking better. She did have some leakage coming through the liver. The outer surface and replaced it but has otherwise been doing fine. She denies any fevers or chills or redness or warmth. Her leg swelling has gone down significantly with the wrap on that leg. it is her left leg.  Relevant past medical, surgical, family and social history reviewed and updated as indicated. Interim medical history since our last visit reviewed. Allergies and medications reviewed and updated.  Review of Systems  Constitutional: Negative for chills and fever.  Respiratory: Negative for chest tightness and shortness of breath.   Cardiovascular: Negative for chest pain and leg swelling.  Musculoskeletal: Negative for gait problem.  Skin: Positive for wound. Negative for rash.  All other systems reviewed and are negative.   Per HPI unless specifically indicated above     Objective:    BP 127/63   Pulse (!) 58   Temp 99 F (37.2 C) (Oral)   Ht 5\' 5"  (1.651 m)   Wt 180 lb (81.6 kg)   BMI 29.95 kg/m   Wt Readings from Last 3 Encounters:  08/22/16 180 lb (81.6 kg)  08/11/16 178 lb 12.8 oz (81.1 kg)  07/20/16 180 lb (81.6 kg)    Physical Exam  Constitutional: She appears well-developed and well-nourished. No distress.  Eyes: Conjunctivae are normal.  Musculoskeletal: Normal range of motion. She exhibits edema (1+ edema, only above wrap, edema where wrap was has much improved).  Neurological: She is alert. Coordination normal.    Skin: Skin is warm and dry. Lesion (2.5 cm x 3 cm lesion with good pink granular tissue base) noted. No rash noted. She is not diaphoretic.  Psychiatric: She has a normal mood and affect. Her behavior is normal.  Nursing note and vitals reviewed.             Assessment & Plan:   Problem List Items Addressed This Visit    None    Visit Diagnoses    Wound of left lower extremity, subsequent encounter    -  Primary   Healing up slowly but improving, good pink granulation base, continue Unna boot       Follow up plan: Return in about 1 week (around 08/29/2016), or if symptoms worsen or fail to improve, for Wound recheck.  Counseling provided for all of the vaccine components No orders of the defined types were placed in this encounter.   Caryl Pina, MD [facility] 08/22/2016, 4:32 PM

## 2016-08-29 ENCOUNTER — Other Ambulatory Visit: Payer: Self-pay | Admitting: Family Medicine

## 2016-08-29 DIAGNOSIS — Z1231 Encounter for screening mammogram for malignant neoplasm of breast: Secondary | ICD-10-CM

## 2016-08-30 ENCOUNTER — Encounter: Payer: Self-pay | Admitting: Family Medicine

## 2016-08-30 ENCOUNTER — Ambulatory Visit (INDEPENDENT_AMBULATORY_CARE_PROVIDER_SITE_OTHER): Payer: Medicare Other | Admitting: Family Medicine

## 2016-08-30 VITALS — BP 130/71 | HR 96 | Temp 97.8°F | Ht 65.0 in | Wt 177.1 lb

## 2016-08-30 DIAGNOSIS — S81802D Unspecified open wound, left lower leg, subsequent encounter: Secondary | ICD-10-CM

## 2016-08-30 NOTE — Progress Notes (Signed)
   BP 130/71   Pulse 96   Temp 97.8 F (36.6 C) (Oral)   Ht 5\' 5"  (1.651 m)   Wt 177 lb 2 oz (80.3 kg)   BMI 29.48 kg/m    Subjective:    Patient ID: [name], female    DOB: April 23, 1941, 76 y.o.   MRN: [id number]  HPI: AMERI GARCIARAMIREZ is a 76 y.o. female presenting on 08/30/2016 for Followup leg wound   HPI Wound recheck Patient is coming in for wound recheck today. She has continued with the Unna boot for the past week and feels like it is looking better. She did have some leakage coming through the Outside cover and wash it and change daily yesterday.. The outer surface and replaced it but has otherwise been doing fine. She denies any fevers or chills or redness or warmth. Her leg swelling has gone down significantly with the wrap on that leg. it is her left leg.  Relevant past medical, surgical, family and social history reviewed and updated as indicated. Interim medical history since our last visit reviewed. Allergies and medications reviewed and updated.  Review of Systems  Constitutional: Negative for chills and fever.  Respiratory: Negative for chest tightness and shortness of breath.   Cardiovascular: Negative for chest pain and leg swelling.  Musculoskeletal: Negative for gait problem.  Skin: Positive for wound. Negative for rash.  All other systems reviewed and are negative.   Per HPI unless specifically indicated above     Objective:    BP 130/71   Pulse 96   Temp 97.8 F (36.6 C) (Oral)   Ht 5\' 5"  (1.651 m)   Wt 177 lb 2 oz (80.3 kg)   BMI 29.48 kg/m   Wt Readings from Last 3 Encounters:  08/30/16 177 lb 2 oz (80.3 kg)  08/22/16 180 lb (81.6 kg)  08/11/16 178 lb 12.8 oz (81.1 kg)    Physical Exam  Constitutional: She appears well-developed and well-nourished. No distress.  Eyes: Conjunctivae are normal.  Musculoskeletal: Normal range of motion. She exhibits edema (1+ edema, only above wrap,).  Neurological: She is alert. Coordination normal.  Skin:  Skin is warm and dry. Lesion (2.5 cm x 2 cm lesion with good pink granular tissue base) noted. No rash noted. She is not diaphoretic.  Psychiatric: She has a normal mood and affect. Her behavior is normal.  Nursing note and vitals reviewed.              Assessment & Plan:   Problem List Items Addressed This Visit    None    Visit Diagnoses    Wound of left lower extremity, subsequent encounter    -  Primary      Nurse replaced Unna boot, patient is doing well and slowly improving, continue compression stockings bilaterally  Follow up plan: Return in about 1 week (around 09/06/2016), or if symptoms worsen or fail to improve.  Counseling provided for all of the vaccine components No orders of the defined types were placed in this encounter.   Caryl Pina, MD [facility] 08/30/2016, 2:16 PM

## 2016-09-01 ENCOUNTER — Other Ambulatory Visit: Payer: Self-pay | Admitting: *Deleted

## 2016-09-01 DIAGNOSIS — I712 Thoracic aortic aneurysm, without rupture, unspecified: Secondary | ICD-10-CM

## 2016-09-07 ENCOUNTER — Ambulatory Visit (INDEPENDENT_AMBULATORY_CARE_PROVIDER_SITE_OTHER): Payer: Medicare Other | Admitting: Family Medicine

## 2016-09-07 ENCOUNTER — Encounter: Payer: Self-pay | Admitting: Family Medicine

## 2016-09-07 VITALS — BP 138/75 | HR 83 | Temp 97.7°F | Ht 65.0 in | Wt 177.5 lb

## 2016-09-07 DIAGNOSIS — S81802D Unspecified open wound, left lower leg, subsequent encounter: Secondary | ICD-10-CM | POA: Diagnosis not present

## 2016-09-07 NOTE — Progress Notes (Signed)
   BP 138/75   Pulse 83   Temp 97.7 F (36.5 C) (Oral)   Ht 5\' 5"  (1.651 m)   Wt 177 lb 8 oz (80.5 kg)   BMI 29.54 kg/m    Subjective:    Patient ID: [name], female    DOB: [birth year], 76 y.o.   MRN: [id number]  HPI: [name] is a 76 y.o. female presenting on 09/07/2016 for Followup leg wound   HPI Wound recheck Patient is coming in for wound recheck today. She has continued with the Unna boot for the past week and feels like it is looking better. She did have some leakage coming through the Outside cover and wash it and change it yesterday. The outer surface and replaced it but has otherwise been doing fine. She denies any fevers or chills or redness or warmth. Her leg swelling has gone down significantly with the wrap on that leg. it is her left leg.  Relevant past medical, surgical, family and social history reviewed and updated as indicated. Interim medical history since our last visit reviewed. Allergies and medications reviewed and updated.  Review of Systems  Constitutional: Negative for chills and fever.  Respiratory: Negative for chest tightness and shortness of breath.   Cardiovascular: Negative for chest pain and leg swelling.  Musculoskeletal: Negative for gait problem.  Skin: Positive for wound. Negative for rash.  All other systems reviewed and are negative.   Per HPI unless specifically indicated above     Objective:    BP 138/75   Pulse 83   Temp 97.7 F (36.5 C) (Oral)   Ht 5\' 5"  (1.651 m)   Wt 177 lb 8 oz (80.5 kg)   BMI 29.54 kg/m   Wt Readings from Last 3 Encounters:  09/07/16 177 lb 8 oz (80.5 kg)  08/30/16 177 lb 2 oz (80.3 kg)  08/22/16 180 lb (81.6 kg)    Physical Exam  Constitutional: She appears well-developed and well-nourished. No distress.  Eyes: Conjunctivae are normal.  Musculoskeletal: Normal range of motion. She exhibits edema (Trace edema, only above wrap,).  Neurological: She is alert. Coordination normal.  Skin:  Skin is warm and dry. Lesion (2.5 cm x 1.9 cm lesion with good pink granular tissue base) noted. No rash noted. She is not diaphoretic.  Psychiatric: She has a normal mood and affect. Her behavior is normal.  Nursing note and vitals reviewed.        Nurse to apply Conservator, museum/gallery & Plan:   Problem List Items Addressed This Visit    None    Visit Diagnoses    Wound of left lower extremity, subsequent encounter    -  Primary   Healing slowly but well, good pink granulation tissue at base.      Nurse replaced [employer], patient is doing well and slowly improving, continue compression stockings bilaterally  Follow up plan: Return in about 1 week (around 09/14/2016), or if symptoms worsen or fail to improve, for Wound recheck.  Counseling provided for all of the vaccine components No orders of the defined types were placed in this encounter.   Caryl Pina, MD [facility] 09/07/2016, 10:50 AM

## 2016-09-14 ENCOUNTER — Ambulatory Visit (INDEPENDENT_AMBULATORY_CARE_PROVIDER_SITE_OTHER): Payer: Medicare Other | Admitting: Family Medicine

## 2016-09-14 ENCOUNTER — Encounter: Payer: Self-pay | Admitting: Family Medicine

## 2016-09-14 VITALS — BP 129/68 | HR 56 | Temp 98.0°F | Ht 65.0 in | Wt 179.0 lb

## 2016-09-14 DIAGNOSIS — S81802D Unspecified open wound, left lower leg, subsequent encounter: Secondary | ICD-10-CM

## 2016-09-14 DIAGNOSIS — E538 Deficiency of other specified B group vitamins: Secondary | ICD-10-CM | POA: Diagnosis not present

## 2016-09-14 NOTE — Progress Notes (Signed)
   BP 129/68   Pulse (!) 56   Temp 98 F (36.7 C) (Oral)   Ht 5\' 5"  (1.651 m)   Wt 179 lb (81.2 kg)   BMI 29.79 kg/m    Subjective:    Patient ID: [name], female    DOB: 10/30/1940, 76 y.o.   MRN: [id number]  HPI: [name] is a 76 y.o. female presenting on 09/14/2016 for Followup leg wound (left lower extremity)   HPI Wound recheck Patient is coming in for wound recheck today. She has continued with the Unna boot for the past week and feels like it is looking better. She says it has been feeling more pruritic lately so it does feel like it's healing. She denies any fevers or chills or redness or warmth. Her leg swelling has gone down significantly with the wrap on that leg. it is her left leg.  Relevant past medical, surgical, family and social history reviewed and updated as indicated. Interim medical history since our last visit reviewed. Allergies and medications reviewed and updated.  Review of Systems  Constitutional: Negative for chills and fever.  Respiratory: Negative for chest tightness and shortness of breath.   Cardiovascular: Negative for chest pain and leg swelling.  Musculoskeletal: Negative for gait problem.  Skin: Positive for wound. Negative for rash.  All other systems reviewed and are negative.   Per HPI unless specifically indicated above     Objective:    BP 129/68   Pulse (!) 56   Temp 98 F (36.7 C) (Oral)   Ht 5\' 5"  (1.651 m)   Wt 179 lb (81.2 kg)   BMI 29.79 kg/m   Wt Readings from Last 3 Encounters:  09/14/16 179 lb (81.2 kg)  09/07/16 177 lb 8 oz (80.5 kg)  08/30/16 177 lb 2 oz (80.3 kg)    Physical Exam  Constitutional: She appears well-developed and well-nourished. No distress.  Eyes: Conjunctivae are normal.  Musculoskeletal: Normal range of motion. She exhibits edema (Trace edema, only above wrap,).  Neurological: She is alert. Coordination normal.  Skin: Skin is warm and dry. Lesion (2.4 cm x 1.8 cm lesion with good  pink granular tissue base) noted. No rash noted. She is not diaphoretic.  Psychiatric: She has a normal mood and affect. Her behavior is normal.  Nursing note and vitals reviewed.        Nurse to place a Haematologist, patient tolerated procedure well     Assessment & Plan:   Problem List Items Addressed This Visit    None    Visit Diagnoses    Wound of left lower extremity, subsequent encounter    -  Primary       Follow up plan: Return in about 1 week (around 09/21/2016), or if symptoms worsen or fail to improve, for Wound recheck.  Counseling provided for all of the vaccine components No orders of the defined types were placed in this encounter.   Caryl Pina, MD [facility] 09/14/2016, 11:25 AM

## 2016-09-16 ENCOUNTER — Ambulatory Visit: Payer: Medicare Other

## 2016-09-20 ENCOUNTER — Ambulatory Visit
Admission: RE | Admit: 2016-09-20 | Discharge: 2016-09-20 | Disposition: A | Discharge status: Home / Self Care | Payer: Medicare Other | Source: Ambulatory Visit | Attending: Family Medicine | Admitting: Family Medicine

## 2016-09-20 DIAGNOSIS — Z1231 Encounter for screening mammogram for malignant neoplasm of breast: Secondary | ICD-10-CM | POA: Diagnosis not present

## 2016-09-21 ENCOUNTER — Ambulatory Visit (INDEPENDENT_AMBULATORY_CARE_PROVIDER_SITE_OTHER): Payer: Medicare Other | Admitting: Family Medicine

## 2016-09-21 ENCOUNTER — Encounter: Payer: Self-pay | Admitting: Family Medicine

## 2016-09-21 VITALS — BP 136/72 | HR 57 | Temp 98.0°F | Ht 65.0 in | Wt 176.0 lb

## 2016-09-21 DIAGNOSIS — S81802D Unspecified open wound, left lower leg, subsequent encounter: Secondary | ICD-10-CM | POA: Diagnosis not present

## 2016-09-21 NOTE — Progress Notes (Signed)
   BP 136/72   Pulse (!) 57   Temp 98 F (36.7 C) (Oral)   Ht 5\' 5"  (1.651 m)   Wt 176 lb (79.8 kg)   BMI 29.29 kg/m    Subjective:    Patient ID: [name], female    DOB: [birth year], 76 y.o.   MRN: [id number]  HPI: [name] is a 76 y.o. female presenting on 09/21/2016 for Followup leg wound   HPI Wound recheck Patient is coming in for wound recheck today. She has continued with the Unna boot for the past week and feels like it is looking better. She says it has been feeling more pruritic lately so it does feel like it's healing. She denies any fevers or chills or redness or warmth. Her leg swelling has gone down significantly with the wrap on thatLeft leg..  Relevant past medical, surgical, family and social history reviewed and updated as indicated. Interim medical history since our last visit reviewed. Allergies and medications reviewed and updated.  Review of Systems  Constitutional: Negative for chills and fever.  Respiratory: Negative for chest tightness and shortness of breath.   Cardiovascular: Negative for chest pain and leg swelling.  Musculoskeletal: Negative for gait problem.  Skin: Positive for wound. Negative for rash.  All other systems reviewed and are negative.   Per HPI unless specifically indicated above     Objective:    BP 136/72   Pulse (!) 57   Temp 98 F (36.7 C) (Oral)   Ht 5\' 5"  (1.651 m)   Wt 176 lb (79.8 kg)   BMI 29.29 kg/m   Wt Readings from Last 3 Encounters:  09/21/16 176 lb (79.8 kg)  09/14/16 179 lb (81.2 kg)  09/07/16 177 lb 8 oz (80.5 kg)    Physical Exam  Constitutional: She appears well-developed and well-nourished. No distress.  Eyes: Conjunctivae are normal.  Musculoskeletal: Normal range of motion. She exhibits edema (Trace edema, only above wrap,).  Neurological: She is alert. Coordination normal.  Skin: Skin is warm and dry. Lesion (2 cm x 1.5 cm lesion with good pink granular tissue base) noted. No rash  noted. She is not diaphoretic.  Psychiatric: She has a normal mood and affect. Her behavior is normal.  Nursing note and vitals reviewed.         Nurse to place a Haematologist, patient tolerated procedure well     Assessment & Plan:   Problem List Items Addressed This Visit    None    Visit Diagnoses    Wound of left lower extremity, subsequent encounter    -  Primary      Continue Unna boots, healing well, hopefully only a few more weeks.  Follow up plan: No Follow-up on file.  Counseling provided for all of the vaccine components No orders of the defined types were placed in this encounter.   Caryl Pina, MD [facility] 09/21/2016, 11:54 AM

## 2016-09-27 ENCOUNTER — Other Ambulatory Visit: Payer: Medicare Other

## 2016-09-27 DIAGNOSIS — R319 Hematuria, unspecified: Secondary | ICD-10-CM

## 2016-09-27 DIAGNOSIS — H401211 Low-tension glaucoma, right eye, mild stage: Secondary | ICD-10-CM | POA: Diagnosis not present

## 2016-09-27 DIAGNOSIS — H40002 Preglaucoma, unspecified, left eye: Secondary | ICD-10-CM | POA: Diagnosis not present

## 2016-09-27 LAB — URINALYSIS, ROUTINE W REFLEX MICROSCOPIC
Bilirubin, UA: NEGATIVE
GLUCOSE, UA: NEGATIVE
KETONES UA: NEGATIVE
Nitrite, UA: NEGATIVE
SPEC GRAV UA: 1.025 (ref 1.005–1.030)
Urobilinogen, Ur: 0.2 mg/dL (ref 0.2–1.0)
pH, UA: 5.5 (ref 5.0–7.5)

## 2016-09-27 LAB — MICROSCOPIC EXAMINATION
RBC, UA: >30 /hpf — AB (ref 0–?)
Renal Epithel, UA: NONE SEEN /hpf

## 2016-09-28 ENCOUNTER — Encounter: Payer: Self-pay | Admitting: Family Medicine

## 2016-09-28 ENCOUNTER — Ambulatory Visit (INDEPENDENT_AMBULATORY_CARE_PROVIDER_SITE_OTHER): Payer: Medicare Other | Admitting: Family Medicine

## 2016-09-28 ENCOUNTER — Telehealth: Payer: Self-pay | Admitting: Family Medicine

## 2016-09-28 VITALS — BP 131/78 | HR 86 | Temp 97.1°F | Ht 65.0 in | Wt 176.0 lb

## 2016-09-28 DIAGNOSIS — S81802D Unspecified open wound, left lower leg, subsequent encounter: Secondary | ICD-10-CM

## 2016-09-28 MED ORDER — SULFAMETHOXAZOLE-TRIMETHOPRIM 800-160 MG PO TABS: 1.0000 | ORAL_TABLET | Freq: Two times a day (BID) | ORAL | 0 refills | Status: DC

## 2016-09-28 NOTE — Progress Notes (Signed)
   BP 131/78   Pulse 86   Temp 97.1 F (36.2 C) (Oral)   Ht 5\' 5"  (1.651 m)   Wt 176 lb (79.8 kg)   BMI 29.29 kg/m    Subjective:    Patient ID: [name], female    DOB: 03/28/1941, 76 y.o.   MRN: [id number]  HPI: [name] is a 76 y.o. female presenting on 09/28/2016 for Followup leg wound   HPI Wound recheck Patient is coming in for wound recheck today. She has continued with the Unna boot for the past week and feels like it is looking better. She says it has been feeling more pruritic lately so it does feel like it's healing. She denies any fevers or chills or redness or warmth. Her leg swelling has gone down significantly with the wrap on thatLeft leg.She says it's even more pruritic than it was last week.  Relevant past medical, surgical, family and social history reviewed and updated as indicated. Interim medical history since our last visit reviewed. Allergies and medications reviewed and updated.  Review of Systems  Constitutional: Negative for chills and fever.  Respiratory: Negative for chest tightness and shortness of breath.   Cardiovascular: Negative for chest pain and leg swelling.  Musculoskeletal: Negative for gait problem.  Skin: Positive for wound. Negative for rash.  All other systems reviewed and are negative.   Per HPI unless specifically indicated above     Objective:    BP 131/78   Pulse 86   Temp 97.1 F (36.2 C) (Oral)   Ht 5\' 5"  (1.651 m)   Wt 176 lb (79.8 kg)   BMI 29.29 kg/m   Wt Readings from Last 3 Encounters:  09/28/16 176 lb (79.8 kg)  09/21/16 176 lb (79.8 kg)  09/14/16 179 lb (81.2 kg)    Physical Exam  Constitutional: She appears well-developed and well-nourished. No distress.  Eyes: Conjunctivae are normal.  Musculoskeletal: Normal range of motion. She exhibits edema (Trace edema, only above wrap,).  Neurological: She is alert. Coordination normal.  Skin: Skin is warm and dry. Lesion (1.9 cm x 1.5 cm lesion with good  pink granular tissue base, flatter than last time) noted. No rash noted. She is not diaphoretic.  Psychiatric: She has a normal mood and affect. Her behavior is normal.  Nursing note and vitals reviewed.        Nurse to place a Haematologist, patient tolerated procedure well     Assessment & Plan:   Problem List Items Addressed This Visit    None    Visit Diagnoses    Wound of left lower extremity, subsequent encounter    -  Primary   Doing very well, continue Unna boot, maybe 1 or 2 more weeks      Continue Unna boots, healing well, hopefully only a few more weeks.  Follow up plan: Return in about 1 week (around 10/05/2016), or if symptoms worsen or fail to improve, for Wound recheck.  Counseling provided for all of the vaccine components No orders of the defined types were placed in this encounter.   Terri Pina, MD [facility] 09/28/2016, 1:52 PM

## 2016-09-28 NOTE — Telephone Encounter (Signed)
Left message for patient to call.

## 2016-09-29 NOTE — Telephone Encounter (Signed)
Patient returned call.  Informed her of urine results and that antibiotic was sent in.

## 2016-10-01 LAB — URINE CULTURE

## 2016-10-04 ENCOUNTER — Other Ambulatory Visit: Payer: Self-pay | Admitting: Internal Medicine

## 2016-10-04 DIAGNOSIS — C73 Malignant neoplasm of thyroid gland: Secondary | ICD-10-CM

## 2016-10-04 DIAGNOSIS — E890 Postprocedural hypothyroidism: Secondary | ICD-10-CM | POA: Diagnosis not present

## 2016-10-05 ENCOUNTER — Other Ambulatory Visit: Payer: Self-pay

## 2016-10-05 ENCOUNTER — Ambulatory Visit (INDEPENDENT_AMBULATORY_CARE_PROVIDER_SITE_OTHER): Payer: Medicare Other | Admitting: Family Medicine

## 2016-10-05 ENCOUNTER — Encounter: Payer: Self-pay | Admitting: Family Medicine

## 2016-10-05 VITALS — BP 125/59 | HR 60 | Temp 98.0°F

## 2016-10-05 DIAGNOSIS — S81802D Unspecified open wound, left lower leg, subsequent encounter: Secondary | ICD-10-CM

## 2016-10-05 DIAGNOSIS — Z87440 Personal history of urinary (tract) infections: Secondary | ICD-10-CM

## 2016-10-05 NOTE — Progress Notes (Signed)
   BP (!) 125/59   Pulse 60   Temp 98 F (36.7 C) (Oral)    Subjective:    Patient ID: [name], female    DOB: 06-11-1941, 76 y.o.   MRN: [id number]  HPI: [name] is a 76 y.o. female presenting on 10/05/2016 for Followup leg wound   HPI Left leg wound follow-up Patient has been doing Unna boot and the leg wound has continually been improving. She still feels very pruritic around the site where the wound healing is going on. She feels like the drainage has been more serosanguinous in nature. She denies any fevers or chills or redness or warmth.  Relevant past medical, surgical, family and social history reviewed and updated as indicated. Interim medical history since our last visit reviewed. Allergies and medications reviewed and updated.  Review of Systems  Constitutional: Negative for chills and fever.  Respiratory: Negative for chest tightness and shortness of breath.   Cardiovascular: Negative for chest pain and leg swelling.  Musculoskeletal: Negative for gait problem.  Skin: Positive for wound. Negative for rash.  All other systems reviewed and are negative.   Per HPI unless specifically indicated above      Objective:    BP (!) 125/59   Pulse 60   Temp 98 F (36.7 C) (Oral)   Wt Readings from Last 3 Encounters:  09/28/16 176 lb (79.8 kg)  09/21/16 176 lb (79.8 kg)  09/14/16 179 lb (81.2 kg)    Physical Exam  Constitutional: She appears well-developed and well-nourished. No distress.  Eyes: Conjunctivae are normal.  Musculoskeletal: Normal range of motion. She exhibits edema (Trace edema, only above wrap,).  Neurological: She is alert. Coordination normal.  Skin: Skin is warm and dry. Lesion (1cm x 1.5 cm lesion with good pink and red granular tissue base, flatter than last time, has started to scab over around the edges) noted. No rash noted. She is not diaphoretic.  Psychiatric: She has a normal mood and affect. Her behavior is normal.  Nursing  note and vitals reviewed.            Wound care: Apply triple antibiotic and nonstick bandage during the day and Ace wrap over top. During the evenings, propped feet and leave open to air.    Assessment & Plan:   Problem List Items Addressed This Visit    None    Visit Diagnoses    Wound of left lower extremity, subsequent encounter    -  Primary       Follow up plan: Return in about 2 weeks (around 10/19/2016), or if symptoms worsen or fail to improve, for Recheck wound.  Counseling provided for all of the vaccine components No orders of the defined types were placed in this encounter.   Caryl Pina, MD [facility] 10/05/2016, 10:14 AM

## 2016-10-11 ENCOUNTER — Other Ambulatory Visit: Payer: Medicare Other

## 2016-10-18 ENCOUNTER — Ambulatory Visit (INDEPENDENT_AMBULATORY_CARE_PROVIDER_SITE_OTHER): Payer: Medicare Other | Admitting: *Deleted

## 2016-10-18 DIAGNOSIS — E538 Deficiency of other specified B group vitamins: Secondary | ICD-10-CM

## 2016-10-18 NOTE — Progress Notes (Signed)
Vit B12 inj given Pt tolerated well 

## 2016-10-19 ENCOUNTER — Encounter: Payer: Medicare Other | Admitting: Cardiothoracic Surgery

## 2016-10-19 ENCOUNTER — Ambulatory Visit
Admission: RE | Admit: 2016-10-19 | Discharge: 2016-10-19 | Disposition: A | Discharge status: Home / Self Care | Payer: Medicare Other | Source: Ambulatory Visit | Attending: Internal Medicine | Admitting: Internal Medicine

## 2016-10-19 ENCOUNTER — Ambulatory Visit
Admission: RE | Admit: 2016-10-19 | Discharge: 2016-10-19 | Disposition: A | Discharge status: Home / Self Care | Payer: Medicare Other | Source: Ambulatory Visit | Attending: Cardiothoracic Surgery | Admitting: Cardiothoracic Surgery

## 2016-10-19 DIAGNOSIS — I712 Thoracic aortic aneurysm, without rupture, unspecified: Secondary | ICD-10-CM

## 2016-10-19 DIAGNOSIS — Z0389 Encounter for observation for other suspected diseases and conditions ruled out: Secondary | ICD-10-CM | POA: Diagnosis not present

## 2016-10-19 DIAGNOSIS — C73 Malignant neoplasm of thyroid gland: Secondary | ICD-10-CM

## 2016-10-19 MED ORDER — IOPAMIDOL (ISOVUE-370) INJECTION 76%
75.0000 mL | Freq: Once | INTRAVENOUS | Status: AC | PRN
  Administered 2016-10-19: 75 mL via INTRAVENOUS

## 2016-10-21 ENCOUNTER — Ambulatory Visit (INDEPENDENT_AMBULATORY_CARE_PROVIDER_SITE_OTHER): Payer: Medicare Other | Admitting: Family Medicine

## 2016-10-21 ENCOUNTER — Encounter: Payer: Self-pay | Admitting: Family Medicine

## 2016-10-21 ENCOUNTER — Telehealth: Payer: Self-pay | Admitting: Family Medicine

## 2016-10-21 VITALS — BP 113/69 | HR 65 | Temp 97.3°F | Ht 65.0 in | Wt 174.0 lb

## 2016-10-21 DIAGNOSIS — C73 Malignant neoplasm of thyroid gland: Secondary | ICD-10-CM | POA: Diagnosis not present

## 2016-10-21 DIAGNOSIS — Z87440 Personal history of urinary (tract) infections: Secondary | ICD-10-CM | POA: Diagnosis not present

## 2016-10-21 DIAGNOSIS — E559 Vitamin D deficiency, unspecified: Secondary | ICD-10-CM

## 2016-10-21 DIAGNOSIS — I10 Essential (primary) hypertension: Secondary | ICD-10-CM

## 2016-10-21 DIAGNOSIS — R300 Dysuria: Secondary | ICD-10-CM

## 2016-10-21 DIAGNOSIS — S81802D Unspecified open wound, left lower leg, subsequent encounter: Secondary | ICD-10-CM | POA: Diagnosis not present

## 2016-10-21 DIAGNOSIS — E7800 Pure hypercholesterolemia, unspecified: Secondary | ICD-10-CM | POA: Diagnosis not present

## 2016-10-21 LAB — URINALYSIS, COMPLETE
BILIRUBIN UA: NEGATIVE
GLUCOSE, UA: NEGATIVE
Ketones, UA: NEGATIVE
NITRITE UA: NEGATIVE
Specific Gravity, UA: 1.01 (ref 1.005–1.030)
UUROB: 0.2 mg/dL (ref 0.2–1.0)
pH, UA: 6 (ref 5.0–7.5)

## 2016-10-21 LAB — MICROSCOPIC EXAMINATION: RENAL EPITHEL UA: NONE SEEN /HPF

## 2016-10-21 MED ORDER — DOXYCYCLINE HYCLATE 100 MG PO TABS: 100.0000 mg | ORAL_TABLET | Freq: Two times a day (BID) | ORAL | 0 refills | Status: DC

## 2016-10-21 MED ORDER — VENLAFAXINE HCL ER 75 MG PO CP24: ORAL_CAPSULE | ORAL | 1 refills | Status: DC

## 2016-10-21 NOTE — Addendum Note (Signed)
Addended by: Zannie Cove on: 10/21/2016 03:37 PM   Modules accepted: Orders

## 2016-10-21 NOTE — Progress Notes (Signed)
   BP 113/69   Pulse 65   Temp 97.3 F (36.3 C) (Oral)   Ht 5\' 5"  (1.651 m)   Wt 174 lb (78.9 kg)   BMI 28.96 kg/m    Subjective:    Patient ID: [name], female    DOB: 1940-12-28, 76 y.o.   MRN: [id number]  HPI: [name] is a 76 y.o. female presenting on 10/21/2016 for Followup leg wound   HPI Patient is coming in for follow-up on leg wound. Patient's leg wound has continued to improve. She has been keeping it more open now and just doing compression on the legs. She has been using triple antibiotic on it and a Band-Aid to keep it from getting rubbed or irritated during the day but keeping it open at night. She denies any fevers or chills or redness or warmth or pain associated with it.  Relevant past medical, surgical, family and social history reviewed and updated as indicated. Interim medical history since our last visit reviewed. Allergies and medications reviewed and updated.  Review of Systems  Constitutional: Negative for chills and fever.  Respiratory: Negative for chest tightness and shortness of breath.   Cardiovascular: Negative for chest pain and leg swelling.  Skin: Positive for wound. Negative for rash.  All other systems reviewed and are negative.   Per HPI unless specifically indicated above        Objective:    BP 113/69   Pulse 65   Temp 97.3 F (36.3 C) (Oral)   Ht 5\' 5"  (1.651 m)   Wt 174 lb (78.9 kg)   BMI 28.96 kg/m   Wt Readings from Last 3 Encounters:  10/21/16 174 lb (78.9 kg)  09/28/16 176 lb (79.8 kg)  09/21/16 176 lb (79.8 kg)    Physical Exam  Constitutional: She appears well-developed and well-nourished. No distress.  Eyes: Conjunctivae are normal.  Musculoskeletal: Normal range of motion. She exhibits edema (1+ edema outside of the area where she had wrapped).  Neurological: She is alert. Coordination normal.  Skin: Skin is warm and dry. Lesion (0.4 cm x 0.9 cm lesion that is still open. The rest is healed well. Good  pink granulation tissue base with a small amount of straw-colored drainage) noted. No rash noted. She is not diaphoretic.  Psychiatric: She has a normal mood and affect. Her behavior is normal.  Nursing note and vitals reviewed.             Assessment & Plan:   Problem List Items Addressed This Visit    None    Visit Diagnoses    Wound of left lower extremity, subsequent encounter    -  Primary   Almost completely healed, will let the patient going continue doing what she is doing currently. Return as needed       Follow up plan: Return if symptoms worsen or fail to improve.  Counseling provided for all of the vaccine components No orders of the defined types were placed in this encounter.   Caryl Pina, MD [facility] 10/21/2016, 11:21 AM

## 2016-10-21 NOTE — Addendum Note (Signed)
Addended by: Michaela Corner on: 10/21/2016 05:02 PM   Modules accepted: Orders

## 2016-10-21 NOTE — Telephone Encounter (Signed)
Pt aware.

## 2016-10-21 NOTE — Telephone Encounter (Signed)
Patient had a positive urinalysis for infection, sent doxycycline for her Terri Pina, MD [facility] 10/21/2016, 4:54 PM

## 2016-10-25 LAB — URINE CULTURE

## 2016-10-26 ENCOUNTER — Telehealth: Payer: Self-pay | Admitting: Family Medicine

## 2016-10-26 LAB — VITAMIN D 25 HYDROXY (VIT D DEFICIENCY, FRACTURES)

## 2016-10-26 NOTE — Telephone Encounter (Signed)
On other notes

## 2016-10-26 NOTE — Telephone Encounter (Signed)
Patient had pseudomonas aeruginosa in her urine which is resistant to certain antibiotics and she has allergies to others, recommended referral to urology for further evaluation

## 2016-10-27 LAB — CBC WITH DIFFERENTIAL/PLATELET
BASOS: 0 %
Basophils Absolute: 0 10*3/uL (ref 0.0–0.2)
EOS (ABSOLUTE): 0.2 10*3/uL (ref 0.0–0.4)
EOS: 4 %
HEMATOCRIT: 36.4 % (ref 34.0–46.6)
Hemoglobin: 11.9 g/dL (ref 11.1–15.9)
IMMATURE GRANS (ABS): 0 10*3/uL (ref 0.0–0.1)
Immature Granulocytes: 0 %
Lymphocytes Absolute: 1.6 10*3/uL (ref 0.7–3.1)
Lymphs: 36 %
MCH: 28 pg (ref 26.6–33.0)
MCHC: 32.7 g/dL (ref 31.5–35.7)
MCV: 86 fL (ref 79–97)
MONOCYTES: 9 %
MONOS ABS: 0.4 10*3/uL (ref 0.1–0.9)
NEUTROS PCT: 51 %
Neutrophils Absolute: 2.3 10*3/uL (ref 1.4–7.0)
PLATELETS: 215 10*3/uL (ref 150–379)
RBC: 4.25 x10E6/uL (ref 3.77–5.28)
RDW: 14.3 % (ref 12.3–15.4)
WBC: 4.5 10*3/uL (ref 3.4–10.8)

## 2016-10-27 LAB — HEPATIC FUNCTION PANEL

## 2016-10-27 LAB — THYROID PANEL WITH TSH
Free Thyroxine Index: 2.6 (ref 1.2–4.9)
T3 Uptake Ratio: 28 % (ref 24–39)
T4 TOTAL: 9.2 ug/dL (ref 4.5–12.0)
TSH: 1.12 u[IU]/mL (ref 0.450–4.500)

## 2016-10-27 LAB — LIPID PANEL

## 2016-10-27 LAB — BMP8+EGFR

## 2016-10-31 ENCOUNTER — Ambulatory Visit: Payer: Medicare Other | Admitting: Family Medicine

## 2016-11-01 ENCOUNTER — Telehealth: Payer: Self-pay

## 2016-11-01 ENCOUNTER — Other Ambulatory Visit: Payer: Self-pay

## 2016-11-01 DIAGNOSIS — N390 Urinary tract infection, site not specified: Secondary | ICD-10-CM

## 2016-11-01 NOTE — Telephone Encounter (Signed)
[name], this patient had a urine culture which showed Pseudomonas.  She is allergic to all the oral meds to treat this, so Dr. Warrick Parisian wants her to see a urologist.  Please make this asap.

## 2016-11-02 ENCOUNTER — Ambulatory Visit (INDEPENDENT_AMBULATORY_CARE_PROVIDER_SITE_OTHER): Payer: Medicare Other | Admitting: Family Medicine

## 2016-11-02 ENCOUNTER — Encounter: Payer: Self-pay | Admitting: Family Medicine

## 2016-11-02 VITALS — BP 131/65 | HR 63 | Temp 98.3°F | Ht 65.0 in | Wt 176.0 lb

## 2016-11-02 DIAGNOSIS — L0390 Cellulitis, unspecified: Secondary | ICD-10-CM

## 2016-11-02 DIAGNOSIS — S80862A Insect bite (nonvenomous), left lower leg, initial encounter: Secondary | ICD-10-CM

## 2016-11-02 DIAGNOSIS — W57XXXA Bitten or stung by nonvenomous insect and other nonvenomous arthropods, initial encounter: Secondary | ICD-10-CM | POA: Diagnosis not present

## 2016-11-02 MED ORDER — DOXYCYCLINE HYCLATE 100 MG PO TABS: 100.0000 mg | ORAL_TABLET | Freq: Two times a day (BID) | ORAL | 0 refills | Status: DC

## 2016-11-02 NOTE — Progress Notes (Signed)
BP 131/65   Pulse 63   Temp 98.3 F (36.8 C) (Oral)   Ht 5\' 5"  (1.651 m)   Wt 176 lb (79.8 kg)   BMI 29.29 kg/m    Subjective:    Patient ID: [name], female    DOB: [birth year], 76 y.o.   MRN: [id number]  HPI: [name] is a 76 y.o. female presenting on 11/02/2016 for Tick Removal (on 10/29/16 on back of left leg)   Patient removed tick 4 days ago from the posterior side of her Left upper leg. Patient has brought tick to clinic today and it is a [place] tick. Patient states that the area is itchy and painful. Patient states that she has used cortisone cream on the area with no relief. Associated symptoms are mild myalgias, mild arthralgias, mild headache and some fatigue that began 2 days ago. Patient denies rash, night sweats, numbness, cough, fever, chills, or shortness of breath.   She is starting to get a little bit of redness and swelling around the site where the bite was initially.  Relevant past medical, surgical, family and social history reviewed and updated as indicated. Interim medical history since our last visit reviewed. Allergies and medications reviewed and updated.  Review of Systems  Constitutional: Positive for fatigue. Negative for chills, diaphoresis and fever.  HENT: Positive for sore throat. Negative for congestion, ear discharge, rhinorrhea, sinus pressure, trouble swallowing and voice change.   Eyes: Negative.  Negative for pain and discharge.  Respiratory: Negative.  Negative for cough, chest tightness, shortness of breath, wheezing and stridor.   Cardiovascular: Negative.  Negative for chest pain.  Gastrointestinal: Negative.  Negative for abdominal pain, constipation, diarrhea, nausea and vomiting.  Endocrine: Negative.   Genitourinary: Negative.  Negative for dysuria and hematuria.  Musculoskeletal: Positive for arthralgias and myalgias. Negative for joint swelling.  Allergic/Immunologic: Negative.   Neurological: Positive for headaches.  Negative for dizziness, syncope, weakness and numbness.  Hematological: Negative.     Per HPI unless specifically indicated above      Objective:    BP 131/65   Pulse 63   Temp 98.3 F (36.8 C) (Oral)   Ht 5\' 5"  (1.651 m)   Wt 176 lb (79.8 kg)   BMI 29.29 kg/m   Wt Readings from Last 3 Encounters:  11/02/16 176 lb (79.8 kg)  10/21/16 174 lb (78.9 kg)  09/28/16 176 lb (79.8 kg)    Physical Exam  Constitutional: She is oriented to person, place, and time. She appears well-developed and well-nourished. No distress.  HENT:  Head: Normocephalic and atraumatic.  Right Ear: External ear normal.  Left Ear: External ear normal.  Eyes: Conjunctivae and EOM are normal. Pupils are equal, round, and reactive to light. Right eye exhibits no discharge. Left eye exhibits no discharge.  Neck: Normal range of motion. Neck supple.  Cardiovascular: Normal rate, regular rhythm, normal heart sounds and intact distal pulses.  Exam reveals no gallop and no friction rub.   No murmur heard. Pulmonary/Chest: Effort normal and breath sounds normal. No respiratory distress. She has no wheezes. She has no rales.  Abdominal: Soft. There is no tenderness.  Musculoskeletal: Normal range of motion.  Neurological: She is alert and oriented to person, place, and time. No cranial nerve deficit.  Skin: Skin is warm and dry. No rash noted. She is not diaphoretic.     Psychiatric: She has a normal mood and affect. Her behavior is normal.  Assessment & Plan:   Problem List Items Addressed This Visit    None    Visit Diagnoses    Tick bite of left lower leg, initial encounter    -  Primary   Cellulitis, unspecified cellulitis site           Follow up plan: Return if symptoms worsen or fail to improve.  Counseling provided for all of the vaccine components No orders of the defined types were placed in this encounter.

## 2016-11-09 ENCOUNTER — Encounter: Payer: Self-pay | Admitting: Cardiothoracic Surgery

## 2016-11-09 ENCOUNTER — Ambulatory Visit (INDEPENDENT_AMBULATORY_CARE_PROVIDER_SITE_OTHER): Payer: Medicare Other | Admitting: Cardiothoracic Surgery

## 2016-11-09 VITALS — BP 121/72 | HR 66 | Resp 20 | Ht 65.0 in | Wt 176.0 lb

## 2016-11-09 DIAGNOSIS — I712 Thoracic aortic aneurysm, without rupture, unspecified: Secondary | ICD-10-CM

## 2016-11-09 NOTE — Progress Notes (Signed)
PCP is Chipper Herb, MD Referring Provider is Chipper Herb, MD  Chief Complaint  Patient presents with  . Thoracic Aortic Aneurysm    1 year f/u with CTA Chest 10/19/16    HPI: 76 year old Caucasian female with mild hypertension follow several years for a ascending fusiform aneurysm. Last CT scan was one year ago where the aneurysm measured 4.7 cm. It remains asymptomatic. Her blood pressure is well-controlled with atenolol 50 mg a day. She has had no chest pain. Previous echo shows normal function of the aortic valve.  Today CT scan shows mild increase in size of her aorta 4.9 cm. There is no mural hematoma or penetrating ulcer. She remains asymptomatic.   The patient has recently had soft tissue injuries to the left leg with nonhealing ulcers treated by wound care center in by her primary physician. These are now healing however she has a chronically swollen left leg.  Past Medical History:  Diagnosis Date  . ACL tear    right  Dr. Gladstone Pih   . Adenomatous polyp   . Anxiety   . Arthritis   . Ascending aortic aneurysm ([place])    note per chart per Dr Lucianne Lei Tright 4.7 cm 04/15/2015   . B12 deficiency   . Cancer ([place])   . Cataracts, both eyes 10/2006  . Constipation   . Depression   . DUB (dysfunctional uterine bleeding) 10/96  . ETD (eustachian tube dysfunction)   . Fall   . Fibromyalgia   . GERD (gastroesophageal reflux disease)   . Glaucoma    ([place]) Dr. Arnoldo Morale   . Glaucoma    bilaterally  . Helicobacter pylori (H. pylori)   . Hiatal hernia   . History of bronchitis   . History of frequent urinary tract infections   . History of left shoulder fracture    pt states fell off bed and broke ball in shoulder had rod placed   . Hyperlipidemia   . Hypertension   . Insomnia   . Leg wound, left    healed   . MVP (mitral valve prolapse)   . Occasional tremors    left arm   . Osteoarthritis   . Osteopenia   . Osteoporosis   . Other and unspecified hyperlipidemia   .  Post-menopausal   . Thyroid cancer ([place])   . Thyroid nodule   . Tremor     Past Surgical History:  Procedure Laterality Date  . APPENDECTOMY    . BUNIONECTOMY  08/1999   right - Dr. Irving Shows   . CHOLECYSTECTOMY  1979  . DILATION AND CURETTAGE OF UTERUS  03/31/95   Dr. Ovid Curd   . EYE SURGERY     also had left cataract removed   . FRACTURE SURGERY    . ORIF HUMERUS FRACTURE  05/30/2011   Procedure: OPEN REDUCTION INTERNAL FIXATION (ORIF) PROXIMAL HUMERUS FRACTURE;  Surgeon: Augustin Schooling;  Location: [facility];  Service: Orthopedics;  Laterality: Left;  open reduction internal fixation of proximal humerus fracture  . right knee replacement  3/11   Dr. Gladstone Pih  . rt. eye cataract  05/28/07  . THYROID LOBECTOMY Left 07/27/2015   Procedure: LEFT THYROID LOBECTOMY;  Surgeon: Fanny Skates, MD;  Location: [facility];  Service: General;  Laterality: Left;  . THYROIDECTOMY N/A 08/06/2015   Procedure: RIGHT THYROID LOBECTOMY, REIMPLANTATION PARATHYROID;  Surgeon: Fanny Skates, MD;  Location: [facility];  Service: General;  Laterality: N/A;  . VENTRAL HERNIA REPAIR  2/99  Family History  Problem Relation Age of Onset  . Cancer Mother        PANCREATIC   . Osteoporosis Mother   . Hip fracture Mother   . Heart attack Father 63       Died 68  . Heart disease Father   . Arthritis Father   . Hypertension Sister   . Cancer Sister        skin  . Hypertension Sister   . Breast cancer Neg Hx     Social History Social History  Substance Use Topics  . Smoking status: Never Smoker  . Smokeless tobacco: Never Used  . Alcohol use No    Current Outpatient Prescriptions  Medication Sig Dispense Refill  . aspirin 81 MG tablet Take 1 tablet (81 mg total) by mouth every other day. 30 tablet   . atenolol (TENORMIN) 50 MG tablet TAKE 1 TABLET DAILY 30 tablet 5  . calcium carbonate (OS-CAL - DOSED IN MG OF ELEMENTAL CALCIUM) 1250 (500 Ca) MG tablet Take 2 tablets (1,000 mg of elemental calcium total) by  mouth 3 (three) times daily with meals. (Patient taking differently: Take 2 tablets by mouth 3 (three) times daily with meals. 5 a day) 50 tablet 2  . celecoxib (CELEBREX) 200 MG capsule TAKE (1) CAPSULE DAILY 30 capsule 2  . clorazepate (TRANXENE) 7.5 MG tablet TAKE 1 TABLET DAILY AS NEEDED (Patient taking differently: TAKE 1 TABLET DAILY AS NEEDED FOR ANXIETY) 30 tablet 1  . doxycycline (VIBRA-TABS) 100 MG tablet Take 1 tablet (100 mg total) by mouth 2 (two) times daily. 1 po bid 28 tablet 0  . fluticasone (FLONASE) 50 MCG/ACT nasal spray Place 2 sprays into the nose daily. (Patient taking differently: Place 2 sprays into the nose daily as needed for allergies. ) 16 g 6  . gabapentin (NEURONTIN) 100 MG capsule Take 1-2 capsules QHS as directed 180 capsule 1  . hydrocortisone (PROCTOZONE-HC) 2.5 % rectal cream APPLY TO RECTALLY 2 TIMES A DAY AS NEEDED 30 g 3  . levothyroxine (SYNTHROID, LEVOTHROID) 88 MCG tablet Take 100 mcg by mouth daily.   10  . Multiple Vitamins-Minerals (SENIOR MULTIVITAMIN PLUS PO) Take by mouth daily.      . Omega-3 Fatty Acids (FISH OIL PO) Take 1 capsule by mouth daily.    . simvastatin (ZOCOR) 80 MG tablet TAKE ONE TABLET AT BEDTIME 30 tablet 5  . TRAVATAN Z 0.004 % SOLN ophthalmic solution INSTILL 1 DROP INTO RIGHT EYE AT BEDTIME 2.5 mL 2  . venlafaxine XR (EFFEXOR-XR) 75 MG 24 hr capsule TAKE (1) CAPSULE DAILY 90 capsule 1  . Vitamin D, Ergocalciferol, (DRISDOL) 50000 units CAPS capsule Take 50,000 Units by mouth every 7 (seven) days.    Marland Kitchen zolpidem (AMBIEN) 10 MG tablet TAKE 1/2 TABLET AT BEDTIME AS NEEDED 45 tablet 2   Current Facility-Administered Medications  Medication Dose Route Frequency Provider Last Rate Last Dose  . cyanocobalamin ((VITAMIN B-12)) injection 1,000 mcg  1,000 mcg Intramuscular Q30 days Chipper Herb, MD   1,000 mcg at 10/18/16 9767    Allergies  Allergen Reactions  . Alendronate Sodium Nausea Only  . Latex Other (See Comments)    RA to  latex 1982   . Levofloxacin Nausea And Vomiting  . Lipitor [Atorvastatin Calcium] Other (See Comments)    myalgias  . Lyrica [Pregabalin] Other (See Comments)    Reaction unknown  . Meloxicam     Vaginal itching   . Sibutramine Hcl  Monohydrate Other (See Comments)    Reaction unknown  . Actonel [Risedronate] Other (See Comments)    Bone pain and nausea     Review of Systems   No fevers Soft tissue infection ulceration of left leg from trauma no almost healed but with residual chronic left lower extremity edema No change in appetite or weight loss No chest pain or palpitation No syncope or headache No abdominal pain or hematochezia No dysuria   BP 121/72   Pulse 66   Resp 20   Ht 5\' 5"  (1.651 m)   Wt 176 lb (79.8 kg)   SpO2 99% Comment: RA  BMI 29.29 kg/m  Physical Exam       Exam    General- alert and comfortable   Lungs- clear without rales, wheezes   Cor- regular rate and rhythm, no murmur , gallop   Abdomen- soft, non-tender   Extremities - warm, non-tender, 2+ left leg edema   Neuro- oriented, appropriate, no focal weakness   Diagnostic Tests: CT scan personally reviewed with patient and husband Moderate fusiform ascending aneurysm 4.9 cm Impression: The current size ascending aorta remains at low risk for acute dissection with adequate blood pressure control-less than 4% risk of aortic dissection  Plan: Continue with surveillance of her ascending aorta-return with CTA of the thoracic aorta in 6 months and continue good blood pressure control.   Len Childs, MD [facility] [phone]

## 2016-11-11 ENCOUNTER — Ambulatory Visit (INDEPENDENT_AMBULATORY_CARE_PROVIDER_SITE_OTHER): Payer: Medicare Other | Admitting: Family Medicine

## 2016-11-11 ENCOUNTER — Encounter: Payer: Self-pay | Admitting: Family Medicine

## 2016-11-11 VITALS — BP 108/60 | HR 64 | Temp 97.1°F | Ht 65.0 in | Wt 176.0 lb

## 2016-11-11 DIAGNOSIS — E7800 Pure hypercholesterolemia, unspecified: Secondary | ICD-10-CM

## 2016-11-11 DIAGNOSIS — C73 Malignant neoplasm of thyroid gland: Secondary | ICD-10-CM

## 2016-11-11 DIAGNOSIS — I10 Essential (primary) hypertension: Secondary | ICD-10-CM

## 2016-11-11 DIAGNOSIS — I712 Thoracic aortic aneurysm, without rupture: Secondary | ICD-10-CM

## 2016-11-11 DIAGNOSIS — E538 Deficiency of other specified B group vitamins: Secondary | ICD-10-CM

## 2016-11-11 DIAGNOSIS — M797 Fibromyalgia: Secondary | ICD-10-CM

## 2016-11-11 DIAGNOSIS — D692 Other nonthrombocytopenic purpura: Secondary | ICD-10-CM

## 2016-11-11 DIAGNOSIS — I7121 Aneurysm of the ascending aorta, without rupture: Secondary | ICD-10-CM

## 2016-11-11 DIAGNOSIS — E559 Vitamin D deficiency, unspecified: Secondary | ICD-10-CM

## 2016-11-11 MED ORDER — PREDNISONE 10 MG PO TABS: ORAL_TABLET | ORAL | 0 refills | Status: DC

## 2016-11-11 MED ORDER — METHYLPREDNISOLONE ACETATE 80 MG/ML IJ SUSP
60.0000 mg | Freq: Once | INTRAMUSCULAR | Status: AC
  Administered 2016-11-11: 60 mg via INTRAMUSCULAR

## 2016-11-11 NOTE — Progress Notes (Signed)
Subjective:    Patient ID: [name], female    DOB: 07-May-1941, 76 y.o.   MRN: [id number]  HPI Pt here for follow up and management of chronic medical problems which includes hyperlipidemia and hypertension. She is taking medication regularly.The patient has had recent lab work but this is going to be redrawn because somehow this got lost in transport. She will come back in fasting for this lab work. She recently saw the vascular surgeon and has an ascending aortic aneurysm which has actually gotten a little bit larger since it was last checked. She has a routine follow-up with him. Patient also has thyroid cancer hyperlipidemia and osteoarthritis with depression and anxiety and fibromyalgia. The patient sees Dr. Nils Pyle for her ascending aortic aneurysm. She is followed by the endocrinologist for her thyroid cancer. She denies any chest pain or shortness of breath. She denies any trouble with her stomach other than some slight nausea which she thinks is coming from having had a recent tick bite in the antibiotic she is taking for that. She also has had a urinary tract infection that has been recurrent and resistant to a lot of antibiotics and she is been referred to the urologist to evaluate this and unfortunately the appointment is not until July. If she gets worse she needs to call us back sooner and will try to get her in on a more urgent basis. The patient has had a recent tick bite is taking doxycycline for this. She's been having a lot more trouble with her fibromyalgia especially in her legs and shoulders area. She has some weakness and tiredness associated with this. The wound on her left lower leg has gotten much better and looks like it's almost healed.     Patient Active Problem List   Diagnosis Date Noted  . Vitamin B 12 deficiency 03/01/2016  . Ascending aortic aneurysm ([place]) 08/20/2015  . Thyroid cancer ([place]) 07/30/2015  . Left thyroid nodule 07/27/2015  . Osteoporosis with  fracture 04/16/2014  . At high risk for falls 04/16/2014  . Proximal humerus fracture 05/30/2011  . CHEST PAIN, PRECORDIAL 02/26/2010  . Hyperlipidemia 02/23/2010  . Depression 02/23/2010  . GLAUCOMA 02/23/2010  . CATARACTS 02/23/2010  . RHEUMATIC FEVER 02/23/2010  . Essential hypertension 02/23/2010  . MITRAL VALVE PROLAPSE 02/23/2010  . GERD 02/23/2010  . Osteoarthritis 02/23/2010  . Primary fibromyalgia syndrome 02/23/2010   Outpatient Encounter Prescriptions as of 11/11/2016  Medication Sig  . aspirin 81 MG tablet Take 1 tablet (81 mg total) by mouth every other day.  Marland Kitchen atenolol (TENORMIN) 50 MG tablet TAKE 1 TABLET DAILY  . calcium carbonate (OS-CAL - DOSED IN MG OF ELEMENTAL CALCIUM) 1250 (500 Ca) MG tablet Take 2 tablets (1,000 mg of elemental calcium total) by mouth 3 (three) times daily with meals. (Patient taking differently: Take 2 tablets by mouth 3 (three) times daily with meals. 5 a day)  . celecoxib (CELEBREX) 200 MG capsule TAKE (1) CAPSULE DAILY  . clorazepate (TRANXENE) 7.5 MG tablet TAKE 1 TABLET DAILY AS NEEDED (Patient taking differently: TAKE 1 TABLET DAILY AS NEEDED FOR ANXIETY)  . doxycycline (VIBRA-TABS) 100 MG tablet Take 1 tablet (100 mg total) by mouth 2 (two) times daily. 1 po bid  . fluticasone (FLONASE) 50 MCG/ACT nasal spray Place 2 sprays into the nose daily. (Patient taking differently: Place 2 sprays into the nose daily as needed for allergies. )  . gabapentin (NEURONTIN) 100 MG capsule Take 1-2 capsules QHS  as directed  . hydrocortisone (PROCTOZONE-HC) 2.5 % rectal cream APPLY TO RECTALLY 2 TIMES A DAY AS NEEDED  . levothyroxine (SYNTHROID, LEVOTHROID) 88 MCG tablet Take 100 mcg by mouth daily.   . Multiple Vitamins-Minerals (SENIOR MULTIVITAMIN PLUS PO) Take by mouth daily.    . Omega-3 Fatty Acids (FISH OIL PO) Take 1 capsule by mouth daily.  . simvastatin (ZOCOR) 80 MG tablet TAKE ONE TABLET AT BEDTIME  . TRAVATAN Z 0.004 % SOLN ophthalmic solution  INSTILL 1 DROP INTO RIGHT EYE AT BEDTIME  . venlafaxine XR (EFFEXOR-XR) 75 MG 24 hr capsule TAKE (1) CAPSULE DAILY  . Vitamin D, Ergocalciferol, (DRISDOL) 50000 units CAPS capsule Take 50,000 Units by mouth every 7 (seven) days.  Marland Kitchen zolpidem (AMBIEN) 10 MG tablet TAKE 1/2 TABLET AT BEDTIME AS NEEDED   Facility-Administered Encounter Medications as of 11/11/2016  Medication  . cyanocobalamin ((VITAMIN B-12)) injection 1,000 mcg     Review of Systems  Constitutional: Positive for fatigue.  HENT: Negative.   Eyes: Negative.   Respiratory: Negative.   Cardiovascular: Negative.   Gastrointestinal: Negative.   Endocrine: Negative.   Genitourinary: Negative.   Musculoskeletal: Negative.   Skin: Negative.   Allergic/Immunologic: Negative.   Neurological: Positive for weakness.  Hematological: Negative.   Psychiatric/Behavioral: Negative.        Objective:   Physical Exam  Constitutional: She is oriented to person, place, and time. She appears well-developed and well-nourished. No distress.  The patient is pleasant and alert  HENT:  Head: Normocephalic and atraumatic.  Right Ear: External ear normal.  Left Ear: External ear normal.  Mouth/Throat: Oropharynx is clear and moist. No oropharyngeal exudate.  Ear canals are clear bilaterally with some left-sided nasal congestion.  Eyes: Conjunctivae and EOM are normal. Pupils are equal, round, and reactive to light. Right eye exhibits no discharge. Left eye exhibits no discharge. No scleral icterus.  Neck: Normal range of motion. Neck supple. No thyromegaly present.  No bruits thyromegaly or anterior cervical adenopathy or thyroid masses.  Cardiovascular: Normal rate, regular rhythm, normal heart sounds and intact distal pulses.   No murmur heard. Heart is regular at 72/m  Pulmonary/Chest: Effort normal and breath sounds normal. No respiratory distress. She has no wheezes. She has no rales. She exhibits no tenderness.  Clear anteriorly  and posteriorly  Abdominal: Soft. Bowel sounds are normal. She exhibits no mass. There is no tenderness. There is no rebound and no guarding.  No abdominal tenderness masses or organ enlargement or bruits  Musculoskeletal: Normal range of motion. She exhibits no edema.  Generalized tenderness around the shoulders and both knees.  Lymphadenopathy:    She has no cervical adenopathy.  Neurological: She is alert and oriented to person, place, and time. She has normal reflexes. No cranial nerve deficit.  Skin: Skin is warm and dry. No rash noted. No erythema.  The wound of the left lower leg appears almost healed. Tick bite on her left posterior thigh appears to be almost healed also.  Psychiatric: She has a normal mood and affect. Her behavior is normal. Judgment and thought content normal.  Nursing note and vitals reviewed.   BP 108/60 (BP Location: Left Arm)   Pulse 64   Temp 97.1 F (36.2 C) (Oral)   Ht 5' 5"  (1.651 m)   Wt 176 lb (79.8 kg)   BMI 29.29 kg/m        Assessment & Plan:  1. Essential hypertension -The blood pressure is good today and  she will continue with her current treatment - BMP8+EGFR; Future - Hepatic function panel; Future  2. Vitamin D deficiency -Continue with vitamin D replacement pending results of blood work that is to be redrawn - VITAMIN D 25 Hydroxy (Vit-D Deficiency, Fractures); Future  3. Pure hypercholesterolemia -Continue aggressive therapeutic lifestyle changes and simvastatin pending results of blood work that is to be drawn - Lipid panel; Future  4. Thyroid cancer [facility]) -Follow-up with endocrinologist as planned  5. Vitamin B 12 deficiency -Continue with vitamin B12 treatment  6. Ascending aortic aneurysm [facility]) -Follow-up with vascular surgeon in November with repeat CT scan as planned  7. Senile purpura ([place])  8. Fibromyalgia -The patient has had increasing aching in her joints myalgias and tiredness and she is taking a short  course of prednisone with an injection of Depo-Medrol. She'll start the prednisone after the doxycycline is completed for the recent tick bite she has had. - methylPREDNISolone acetate (DEPO-MEDROL) injection 60 mg; Inject 0.75 mLs (60 mg total) into the muscle once.  Meds ordered this encounter  Medications  . predniSONE (DELTASONE) 10 MG tablet    Sig: Take 1 tab QID x 2 days, then 1 tab TID x 2 days, then 1 tab BID x 2 days, then 1 tab QD x 2 days.    Dispense:  20 tablet    Refill:  0  . methylPREDNISolone acetate (DEPO-MEDROL) injection 60 mg   Patient Instructions                       Medicare Annual Wellness Visit  [facility] and the medical providers at [facility] strive to bring you the best medical care.  In doing so we not only want to address your current medical conditions and concerns but also to detect new conditions early and prevent illness, disease and health-related problems.    Medicare offers a yearly Wellness Visit which allows our clinical staff to assess your need for preventative services including immunizations, lifestyle education, counseling to decrease risk of preventable diseases and screening for fall risk and other medical concerns.    This visit is provided free of charge (no copay) for all Medicare recipients. The clinical pharmacists at [facility] have begun to conduct these Wellness Visits which will also include a thorough review of all your medications.    As you primary medical provider recommend that you make an appointment for your Annual Wellness Visit if you have not done so already this year.  You may set up this appointment before you leave today or you may call back ([phone]) and schedule an appointment.  Please make sure when you call that you mention that you are scheduling your Annual Wellness Visit with the clinical pharmacist so that the appointment may be made for the proper length of time.      Continue current medications. Continue good therapeutic lifestyle changes which include good diet and exercise. Fall precautions discussed with patient. If an FOBT was given today- please return it to our front desk. If you are over 70 years old - you may need Prevnar 75 or the adult Pneumonia vaccine.  **Flu shots are available--- please call and schedule a FLU-CLINIC appointment**  After your visit with [place] today you will receive a survey in the mail or online from [employer] regarding your care with [place]. Please take a moment to fill this out. Your feedback is very important to [place] as you  can help [place] better understand your patient needs as well as improve your experience and satisfaction. WE CARE ABOUT YOU!!!  The patient should follow-up with the vascular surgeon as planned with a repeat CT scan in November. The patient should follow-up with the endocrinologist for her thyroid cancer as planned Finish antibiotics for tick bite and make sure you take these with food Call if urinary tract symptoms worsen and we will try to get you in to see the urologist on a more urgent visit Take prednisone as directed Start the prednisone after you finish the doxycycline   Arrie Senate MD

## 2016-11-11 NOTE — Patient Instructions (Addendum)
Medicare Annual Wellness Visit  [facility] and the medical providers at [facility] strive to bring you the best medical care.  In doing so we not only want to address your current medical conditions and concerns but also to detect new conditions early and prevent illness, disease and health-related problems.    Medicare offers a yearly Wellness Visit which allows our clinical staff to assess your need for preventative services including immunizations, lifestyle education, counseling to decrease risk of preventable diseases and screening for fall risk and other medical concerns.    This visit is provided free of charge (no copay) for all Medicare recipients. The clinical pharmacists at [facility] have begun to conduct these Wellness Visits which will also include a thorough review of all your medications.    As you primary medical provider recommend that you make an appointment for your Annual Wellness Visit if you have not done so already this year.  You may set up this appointment before you leave today or you may call back [phone]) and schedule an appointment.  Please make sure when you call that you mention that you are scheduling your Annual Wellness Visit with the clinical pharmacist so that the appointment may be made for the proper length of time.     Continue current medications. Continue good therapeutic lifestyle changes which include good diet and exercise. Fall precautions discussed with patient. If an FOBT was given today- please return it to our front desk. If you are over 74 years old - you may need Prevnar 29 or the adult Pneumonia vaccine.  **Flu shots are available--- please call and schedule a FLU-CLINIC appointment**  After your visit with [place] today you will receive a survey in the mail or online from [employer] regarding your care with [place]. Please take a moment to fill this out. Your feedback is very  important to [place] as you can help [place] better understand your patient needs as well as improve your experience and satisfaction. WE CARE ABOUT YOU!!!  The patient should follow-up with the vascular surgeon as planned with a repeat CT scan in November. The patient should follow-up with the endocrinologist for her thyroid cancer as planned Finish antibiotics for tick bite and make sure you take these with food Call if urinary tract symptoms worsen and we will try to get you in to see the urologist on a more urgent visit Take prednisone as directed Start the prednisone after you finish the doxycycline

## 2016-11-12 ENCOUNTER — Other Ambulatory Visit: Payer: Medicare Other

## 2016-11-12 DIAGNOSIS — E7800 Pure hypercholesterolemia, unspecified: Secondary | ICD-10-CM

## 2016-11-12 DIAGNOSIS — E559 Vitamin D deficiency, unspecified: Secondary | ICD-10-CM | POA: Diagnosis not present

## 2016-11-12 DIAGNOSIS — I10 Essential (primary) hypertension: Secondary | ICD-10-CM | POA: Diagnosis not present

## 2016-11-14 ENCOUNTER — Other Ambulatory Visit: Payer: Self-pay | Admitting: Family Medicine

## 2016-11-14 LAB — BMP8+EGFR
BUN / CREAT RATIO: 20 (ref 12–28)
BUN: 15 mg/dL (ref 8–27)
CALCIUM: 8.2 mg/dL — AB (ref 8.7–10.3)
CO2: 26 mmol/L (ref 18–29)
CREATININE: 0.75 mg/dL (ref 0.57–1.00)
Chloride: 101 mmol/L (ref 96–106)
GFR calc non Af Amer: 79 mL/min/{1.73_m2} (ref >59)
GFR, EST AFRICAN AMERICAN: 91 mL/min/{1.73_m2} (ref >59)
Glucose: 115 mg/dL — ABNORMAL HIGH (ref 65–99)
Potassium: 3.9 mmol/L (ref 3.5–5.2)
Sodium: 142 mmol/L (ref 134–144)

## 2016-11-14 LAB — SPECIMEN STATUS

## 2016-11-14 LAB — LIPID PANEL
CHOL/HDL RATIO: 2.4 ratio (ref 0.0–4.4)
Cholesterol, Total: 155 mg/dL (ref 100–199)
HDL: 65 mg/dL (ref >39)
LDL CALC: 78 mg/dL (ref 0–99)
Triglycerides: 58 mg/dL (ref 0–149)
VLDL Cholesterol Cal: 12 mg/dL (ref 5–40)

## 2016-11-14 LAB — HEPATIC FUNCTION PANEL
ALBUMIN: 4.2 g/dL (ref 3.5–4.8)
ALK PHOS: 86 IU/L (ref 39–117)
ALT: 8 IU/L (ref 0–32)
AST: 18 IU/L (ref 0–40)
BILIRUBIN TOTAL: 0.5 mg/dL (ref 0.0–1.2)
BILIRUBIN, DIRECT: 0.16 mg/dL (ref 0.00–0.40)
Total Protein: 6 g/dL (ref 6.0–8.5)

## 2016-11-14 LAB — VITAMIN D 25 HYDROXY (VIT D DEFICIENCY, FRACTURES): Vit D, 25-Hydroxy: 54.2 ng/mL (ref 30.0–100.0)

## 2016-11-16 ENCOUNTER — Telehealth: Payer: Self-pay | Admitting: Family Medicine

## 2016-11-16 NOTE — Telephone Encounter (Signed)
Pt called and left detailed message of labs / mychart as well

## 2016-11-17 DIAGNOSIS — C73 Malignant neoplasm of thyroid gland: Secondary | ICD-10-CM | POA: Diagnosis not present

## 2016-11-18 ENCOUNTER — Ambulatory Visit (INDEPENDENT_AMBULATORY_CARE_PROVIDER_SITE_OTHER): Payer: Medicare Other | Admitting: *Deleted

## 2016-11-18 DIAGNOSIS — E538 Deficiency of other specified B group vitamins: Secondary | ICD-10-CM

## 2016-11-18 NOTE — Progress Notes (Signed)
Left del- pt tolerates well

## 2016-12-02 ENCOUNTER — Encounter: Payer: Self-pay | Admitting: Pediatrics

## 2016-12-02 ENCOUNTER — Ambulatory Visit (INDEPENDENT_AMBULATORY_CARE_PROVIDER_SITE_OTHER): Payer: Medicare Other | Admitting: Pediatrics

## 2016-12-02 VITALS — BP 127/67 | HR 59 | Temp 97.7°F | Ht 65.0 in | Wt 172.0 lb

## 2016-12-02 DIAGNOSIS — K2960 Other gastritis without bleeding: Secondary | ICD-10-CM

## 2016-12-02 DIAGNOSIS — N3090 Cystitis, unspecified without hematuria: Secondary | ICD-10-CM

## 2016-12-02 DIAGNOSIS — R300 Dysuria: Secondary | ICD-10-CM | POA: Diagnosis not present

## 2016-12-02 LAB — WET PREP FOR TRICH, YEAST, CLUE
CLUE CELL EXAM: NEGATIVE
TRICHOMONAS EXAM: NEGATIVE
Yeast Exam: NEGATIVE

## 2016-12-02 LAB — URINALYSIS, COMPLETE
BILIRUBIN UA: NEGATIVE
Glucose, UA: NEGATIVE
Nitrite, UA: POSITIVE — AB
PH UA: 5.5 (ref 5.0–7.5)
Specific Gravity, UA: >1.03 — ABNORMAL HIGH (ref 1.005–1.030)
Urobilinogen, Ur: 0.2 mg/dL (ref 0.2–1.0)

## 2016-12-02 LAB — MICROSCOPIC EXAMINATION
Epithelial Cells (non renal): >10 /hpf — AB (ref 0–10)
RBC, UA: >30 /hpf — AB (ref 0–?)

## 2016-12-02 MED ORDER — FAMOTIDINE 20 MG PO TABS: 20.0000 mg | ORAL_TABLET | Freq: Two times a day (BID) | ORAL | 1 refills | Status: DC

## 2016-12-02 MED ORDER — SULFAMETHOXAZOLE-TRIMETHOPRIM 800-160 MG PO TABS: 1.0000 | ORAL_TABLET | Freq: Two times a day (BID) | ORAL | 0 refills | Status: DC

## 2016-12-02 NOTE — Progress Notes (Signed)
  Subjective:   Patient ID: [name], female    DOB: 1941/05/22, 76 y.o.   MRN: [id number] CC: Burning with urination  HPI: [name] is a 76 y.o. female presenting for Burning with urination  Happens with every void No fevers Some nausea last night Feeling better today Appetite has been ok No back or abd pain Has appt to see urology for frequent UTIs later this summer  Some burning in back of mouth as well Has been on prilosec in the past, helped some with symptoms Worse with spicy foods Not happening every day Having regular bowel movements, takes something for constipation if she hasnt gone in a couple of days  Relevant past medical, surgical, family and social history reviewed. Allergies and medications reviewed and updated. History  Smoking Status  . Never Smoker  Smokeless Tobacco  . Never Used   ROS: Per HPI   Objective:    BP 127/67   Pulse (!) 59   Temp 97.7 F (36.5 C) (Oral)   Ht 5\' 5"  (1.651 m)   Wt 172 lb (78 kg)   BMI 28.62 kg/m   Wt Readings from Last 3 Encounters:  12/02/16 172 lb (78 kg)  11/11/16 176 lb (79.8 kg)  11/09/16 176 lb (79.8 kg)    Gen: NAD, alert, well-appearing, cooperative with exam, NCAT EYES: EOMI, no conjunctival injection, or no icterus CV: NRRR, normal S1/S2, no murmur, distal pulses 2+ b/l Resp: CTABL, no wheezes, normal WOB Abd: +BS, soft, NTND. no guarding or organomegaly, no CVA tenderness Ext: No edema, warm Neuro: Alert and oriented, strength equal b/l UE and LE, coordination grossly normal MSK: normal muscle bulk   Assessment & Plan:  [name] was seen today for burning with urination.  Diagnoses and all orders for this visit:  Cystitis +LEU, +nitrites in urine Will treat with below F/u culture -     sulfamethoxazole-trimethoprim (BACTRIM DS,SEPTRA DS) 800-160 MG tablet; Take 1 tablet by mouth 2 (two) times daily.  Burning with urination -     Urine Culture -     Urinalysis, Complete -     WET PREP  FOR TRICH, YEAST, CLUE  Reflux gastritis If not improving let us know -     famotidine (PEPCID) 20 MG tablet; Take 1 tablet (20 mg total) by mouth 2 (two) times daily.   Follow up plan: As scheduled, sooner if needed. return precautions discussed. Assunta Found, MD [place]

## 2016-12-04 LAB — URINE CULTURE

## 2016-12-20 ENCOUNTER — Ambulatory Visit (INDEPENDENT_AMBULATORY_CARE_PROVIDER_SITE_OTHER): Payer: Medicare Other | Admitting: *Deleted

## 2016-12-20 DIAGNOSIS — E538 Deficiency of other specified B group vitamins: Secondary | ICD-10-CM

## 2016-12-20 NOTE — Progress Notes (Signed)
B12 injection given Left Deltoid.  Tolerated Well

## 2016-12-23 DIAGNOSIS — N390 Urinary tract infection, site not specified: Secondary | ICD-10-CM | POA: Diagnosis not present

## 2016-12-23 DIAGNOSIS — R350 Frequency of micturition: Secondary | ICD-10-CM | POA: Diagnosis not present

## 2016-12-23 DIAGNOSIS — R351 Nocturia: Secondary | ICD-10-CM | POA: Diagnosis not present

## 2016-12-27 DIAGNOSIS — M25512 Pain in left shoulder: Secondary | ICD-10-CM | POA: Diagnosis not present

## 2016-12-27 DIAGNOSIS — G8929 Other chronic pain: Secondary | ICD-10-CM | POA: Diagnosis not present

## 2016-12-30 ENCOUNTER — Ambulatory Visit (INDEPENDENT_AMBULATORY_CARE_PROVIDER_SITE_OTHER): Payer: Medicare Other | Admitting: *Deleted

## 2016-12-30 VITALS — BP 145/74 | HR 57 | Ht 63.0 in | Wt 174.0 lb

## 2016-12-30 DIAGNOSIS — Z0000 Encounter for general adult medical examination without abnormal findings: Secondary | ICD-10-CM | POA: Diagnosis not present

## 2016-12-30 NOTE — Patient Instructions (Addendum)
  Ms. Gravelle ,  Thank you for taking time to come for your Medicare Wellness Visit. I appreciate your ongoing commitment to your health goals. Please review the following plan we discussed and let me know if I can assist you in the future.   These are the goals we discussed: Goals    . Exercise 150 minutes per week (moderate activity)          Walk for 30 minutes daily       Bring a signed/notarized of Advance Directives to our office.  This is a list of the screening recommended for you and due dates:  Health Maintenance  Topic Date Due  . Pneumonia vaccines (2 of 2 - PPSV23) 06/13/2017*  . Pap Smear  11/12/2023*  . Flu Shot  01/25/2017  . Colon Cancer Screening  05/27/2017  . Mammogram  09/21/2018  . Tetanus Vaccine  01/18/2024  . DEXA scan (bone density measurement)  Completed  *Topic was postponed. The date shown is not the original due date.

## 2017-01-04 NOTE — Progress Notes (Addendum)
Subjective:   [name] is a 76 y.o. female who presents for an Initial Medicare Annual Wellness Visit. [name] is retired and lives at home with her husband. They have a daughter. She enjoys gardening and watching television.   Review of Systems    Reports that health is a little worse than last year.   Cardiac Risk Factors include: advanced age (>23men, >65 women);obesity (BMI >30kg/m2);hypertension;dyslipidemia      Objective:    Today's Vitals   12/30/16 1355  BP: (!) 145/74  Pulse: (!) 57  Weight: 174 lb (78.9 kg)  Height: 5\' 3"  (1.6 m)   Body mass index is 30.82 kg/m.   Current Medications (verified) Outpatient Encounter Prescriptions as of 12/30/2016  Medication Sig  . aspirin 81 MG tablet Take 1 tablet (81 mg total) by mouth every other day.  Marland Kitchen atenolol (TENORMIN) 50 MG tablet TAKE 1 TABLET DAILY  . calcium carbonate (OS-CAL - DOSED IN MG OF ELEMENTAL CALCIUM) 1250 (500 Ca) MG tablet Take 2 tablets (1,000 mg of elemental calcium total) by mouth 3 (three) times daily with meals. (Patient taking differently: Take 2 tablets by mouth 3 (three) times daily with meals. 5 a day)  . celecoxib (CELEBREX) 200 MG capsule TAKE (1) CAPSULE DAILY  . clorazepate (TRANXENE) 7.5 MG tablet TAKE 1 TABLET DAILY AS NEEDED (Patient taking differently: TAKE 1 TABLET DAILY AS NEEDED FOR ANXIETY)  . famotidine (PEPCID) 20 MG tablet Take 1 tablet (20 mg total) by mouth 2 (two) times daily.  . fluticasone (FLONASE) 50 MCG/ACT nasal spray Place 2 sprays into the nose daily. (Patient taking differently: Place 2 sprays into the nose daily as needed for allergies. )  . gabapentin (NEURONTIN) 100 MG capsule Take 1-2 capsules QHS as directed  . hydrocortisone (PROCTOZONE-HC) 2.5 % rectal cream APPLY TO RECTALLY 2 TIMES A DAY AS NEEDED  . levothyroxine (SYNTHROID, LEVOTHROID) 100 MCG tablet Take 100 mcg by mouth daily.   . Multiple Vitamins-Minerals (SENIOR MULTIVITAMIN PLUS PO) Take by mouth  daily.    . Omega-3 Fatty Acids (FISH OIL PO) Take 1 capsule by mouth daily.  . simvastatin (ZOCOR) 80 MG tablet TAKE ONE TABLET AT BEDTIME  . TRAVATAN Z 0.004 % SOLN ophthalmic solution INSTILL 1 DROP INTO RIGHT EYE AT BEDTIME  . venlafaxine XR (EFFEXOR-XR) 75 MG 24 hr capsule TAKE (1) CAPSULE DAILY  . Vitamin D, Ergocalciferol, (DRISDOL) 50000 units CAPS capsule Take 50,000 Units by mouth every 7 (seven) days.  Marland Kitchen zolpidem (AMBIEN) 10 MG tablet TAKE 1/2 TABLET AT BEDTIME AS NEEDED  . [DISCONTINUED] sulfamethoxazole-trimethoprim (BACTRIM DS,SEPTRA DS) 800-160 MG tablet Take 1 tablet by mouth 2 (two) times daily.   Facility-Administered Encounter Medications as of 12/30/2016  Medication  . cyanocobalamin ((VITAMIN B-12)) injection 1,000 mcg    Allergies (verified) Alendronate sodium; Latex; Levofloxacin; Lipitor [atorvastatin calcium]; Lyrica [pregabalin]; Meloxicam; Sibutramine hcl monohydrate; and Actonel [risedronate]   History: Past Medical History:  Diagnosis Date  . ACL tear    right  Dr. Gladstone Pih   . Adenomatous polyp   . Anxiety   . Aorta aneurysm ([place])   . Arthritis   . Ascending aortic aneurysm ([place])    note per chart per Dr Lucianne Lei Tright 4.7 cm 04/15/2015   . B12 deficiency   . Cancer ([place])   . Cataracts, both eyes 10/2006  . Constipation   . Depression   . DUB (dysfunctional uterine bleeding) 10/96  . ETD (eustachian tube dysfunction)   .  Fall   . Fibromyalgia   . GERD (gastroesophageal reflux disease)   . Glaucoma    ([place]) Dr. Arnoldo Morale   . Glaucoma    bilaterally  . Helicobacter pylori (H. pylori)   . Hiatal hernia   . History of bronchitis   . History of frequent urinary tract infections   . History of left shoulder fracture    pt states fell off bed and broke ball in shoulder had rod placed   . Hyperlipidemia   . Hypertension   . Insomnia   . Leg wound, left    healed   . MVP (mitral valve prolapse)   . Occasional tremors    left arm   . Osteoarthritis     . Osteopenia   . Osteoporosis   . Other and unspecified hyperlipidemia   . Post-menopausal   . Thyroid cancer ([place])   . Thyroid nodule   . Tremor    Past Surgical History:  Procedure Laterality Date  . APPENDECTOMY    . BUNIONECTOMY  08/1999   right - Dr. Irving Shows   . CHOLECYSTECTOMY  1979  . DILATION AND CURETTAGE OF UTERUS  03/31/95   Dr. Ovid Curd   . EYE SURGERY     also had left cataract removed   . FRACTURE SURGERY    . ORIF HUMERUS FRACTURE  05/30/2011   Procedure: OPEN REDUCTION INTERNAL FIXATION (ORIF) PROXIMAL HUMERUS FRACTURE;  Surgeon: Augustin Schooling;  Location: [facility];  Service: Orthopedics;  Laterality: Left;  open reduction internal fixation of proximal humerus fracture  . right knee replacement  3/11   Dr. Gladstone Pih  . rt. eye cataract  05/28/07  . THYROID LOBECTOMY Left 07/27/2015   Procedure: LEFT THYROID LOBECTOMY;  Surgeon: Fanny Skates, MD;  Location: [facility];  Service: General;  Laterality: Left;  . THYROIDECTOMY N/A 08/06/2015   Procedure: RIGHT THYROID LOBECTOMY, REIMPLANTATION PARATHYROID;  Surgeon: Fanny Skates, MD;  Location: [facility];  Service: General;  Laterality: N/A;  . VENTRAL HERNIA REPAIR  2/99   Family History  Problem Relation Age of Onset  . Cancer Mother        PANCREATIC   . Osteoporosis Mother   . Hip fracture Mother   . Heart attack Father 63       Died 68  . Heart disease Father   . Arthritis Father   . Hypertension Sister   . Cancer Sister        skin  . Hypertension Sister   . Breast cancer Neg Hx    Social History   Occupational History  . Not on file.   Social History Main Topics  . Smoking status: Never Smoker  . Smokeless tobacco: Never Used  . Alcohol use No  . Drug use: No  . Sexual activity: No    Tobacco Counseling No tobacco use  Activities of Daily Living In your present state of health, do you have any difficulty performing the following activities: 12/30/2016  Hearing? N  Vision? N  Difficulty concentrating  or making decisions? N  Walking or climbing stairs? N  Dressing or bathing? N  Doing errands, shopping? N  Preparing Food and eating ? N  Using the Toilet? N  In the past six months, have you accidently leaked urine? N  Do you have problems with loss of bowel control? N  Managing your Medications? N  Managing your Finances? N  Housekeeping or managing your Housekeeping? N  Some recent data might be hidden  Immunizations and Health Maintenance Immunization History  Administered Date(s) Administered  . Influenza,inj,Quad PF,36+ Mos 03/27/2013, 04/01/2014, 03/31/2015, 03/22/2016  . Pneumococcal Conjugate-13 11/15/2013  . Td 01/17/2014   There are no preventive care reminders to display for this patient.  Patient Care Team: Chipper Herb, MD as PCP - General (Family Medicine) Steffanie Rainwater, DPM as Consulting Physician (Podiatry) Minus Breeding, MD as Consulting Physician (Cardiology) Netta Cedars, MD as Consulting Physician (Orthopedic Surgery) Clarene Essex, MD as Consulting Physician (Gastroenterology) Irine Seal, MD as Attending Physician (Urology) Latanya Maudlin, MD as Consulting Physician (Orthopedic Surgery) Harlen Labs, MD as Referring Physician (Optometry)  No ER visits, hospitalizations, or surgeries this past year.      Assessment:   This is a routine wellness examination for [name].   Hearing/Vision screen No hearing or vision deficits noted during visit. Last exam 08/2016.  Dietary issues and exercise activities discussed: Current Exercise Habits: Home exercise routine, Type of exercise: walking, Time (Minutes): 30, Frequency (Times/Week): 7, Weekly Exercise (Minutes/Week): 210, Intensity: Moderate, Exercise limited by: None identified  Diet: Eats breakfast, supper, and a snack at lunch  Goals    . Exercise 150 minutes per week (moderate activity)          Walk for 30 minutes daily      Depression Screen PHQ 2/9 Scores 12/30/2016 12/02/2016 11/11/2016  11/02/2016 10/21/2016 10/05/2016 09/28/2016  PHQ - 2 Score 4 0 0 0 0 0 0  PHQ- 9 Score 5 - - - - - -    Fall Risk Fall Risk  12/30/2016 12/02/2016 11/11/2016 11/02/2016 10/21/2016  Falls in the past year? No No No No No  Number falls in past yr: - - - - -  Injury with Fall? - - - - -  Risk Factor Category  - - - - -  Risk for fall due to : - - - - -    Cognitive Function: MMSE - Mini Mental State Exam 12/30/2016  Orientation to time 5  Orientation to Place 4  Registration 3  Attention/ Calculation 5  Recall 3  Language- name 2 objects 2  Language- repeat 1  Language- follow 3 step command 3  Language- read & follow direction 1  Write a sentence 0  Copy design 1  Total score 28  Normal exam      Screening Tests Health Maintenance  Topic Date Due  . PNA vac Low Risk Adult (2 of 2 - PPSV23) 06/13/2017 (Originally 11/16/2014)  . PAP SMEAR  11/12/2023 (Originally 11/03/2016)  . INFLUENZA VACCINE  01/25/2017  . COLONOSCOPY  05/27/2017  . MAMMOGRAM  09/21/2018  . TETANUS/TDAP  01/18/2024  . DEXA SCAN  Completed      Plan:  Continue to stay active and walk for 30 minutes daily Keep appt with Dr Laurance Flatten in September Move carefully to avoid falls Colonoscopy due 05/2017. She is on a recall list.   I have personally reviewed and noted the following in the patient's chart:   . Medical and social history . Use of alcohol, tobacco or illicit drugs  . Current medications and supplements . Functional ability and status . Nutritional status . Physical activity . Advanced directives . List of other physicians . Hospitalizations, surgeries, and ER visits in previous 12 months . Vitals . Screenings to include cognitive, depression, and falls . Referrals and appointments  In addition, I have reviewed and discussed with patient certain preventive protocols, quality metrics, and best practice recommendations. A written personalized  care plan for preventive services as well as general preventive  health recommendations were provided to patient.     Chong Sicilian, RN   01/04/2017   I have reviewed and agree with the above AWV documentation.   Mary-Margaret Hassell Done, FNP

## 2017-01-13 ENCOUNTER — Other Ambulatory Visit: Payer: Self-pay | Admitting: Family Medicine

## 2017-01-17 ENCOUNTER — Ambulatory Visit (INDEPENDENT_AMBULATORY_CARE_PROVIDER_SITE_OTHER): Payer: Medicare Other | Admitting: Family Medicine

## 2017-01-17 ENCOUNTER — Encounter: Payer: Self-pay | Admitting: Family Medicine

## 2017-01-17 VITALS — BP 120/61 | HR 67 | Ht 63.0 in | Wt 172.0 lb

## 2017-01-17 DIAGNOSIS — S80861A Insect bite (nonvenomous), right lower leg, initial encounter: Secondary | ICD-10-CM | POA: Diagnosis not present

## 2017-01-17 DIAGNOSIS — W57XXXA Bitten or stung by nonvenomous insect and other nonvenomous arthropods, initial encounter: Secondary | ICD-10-CM | POA: Diagnosis not present

## 2017-01-17 MED ORDER — DOXYCYCLINE HYCLATE 100 MG PO CAPS
100.0000 mg | ORAL_CAPSULE | Freq: Two times a day (BID) | ORAL | 0 refills | Status: DC
Start: 2017-01-17 — End: 2017-03-21

## 2017-01-17 MED ORDER — TRIAMCINOLONE ACETONIDE 0.1 % EX CREA: 1.0000 "application " | TOPICAL_CREAM | Freq: Three times a day (TID) | CUTANEOUS | 0 refills | Status: DC

## 2017-01-17 MED ORDER — CLOBETASOL PROPIONATE 0.05 % EX CREA: 1.0000 "application " | TOPICAL_CREAM | Freq: Two times a day (BID) | CUTANEOUS | 2 refills | Status: DC

## 2017-01-17 NOTE — Progress Notes (Signed)
Subjective:  Patient ID: [name], female    DOB: 04-09-1941  Age: 76 y.o. MRN: [id number]  CC: Insect Bite   HPI [name] presents for Bite from unknown inset occurring within the last 24 hours itching and red and irritated. Location right lower leg.Has had multiple tick bites recently. No fever chills or sweats. No tick removed from current site. No rash under than the local eruption at the site  Depression screen [facility] 2/9 01/17/2017 12/30/2016 12/02/2016  Decreased Interest 2 2 0  Down, Depressed, Hopeless 2 2 0  PHQ - 2 Score 4 4 0  Altered sleeping 0 0 -  Tired, decreased energy 0 0 -  Change in appetite 0 0 -  Feeling bad or failure about yourself  0 0 -  Trouble concentrating 0 0 -  Moving slowly or fidgety/restless 0 0 -  Suicidal thoughts 1 1 -  PHQ-9 Score 5 5 -  Difficult doing work/chores Not difficult at all Not difficult at all -  Some recent data might be hidden    History Wilder has a past medical history of ACL tear; Adenomatous polyp; Anxiety; Aorta aneurysm ([place]); Arthritis; Ascending aortic aneurysm ([place]); B12 deficiency; Cancer ([place]); Cataracts, both eyes (10/2006); Constipation; Depression; DUB (dysfunctional uterine bleeding) (10/96); ETD (eustachian tube dysfunction); Fall; Fibromyalgia; GERD (gastroesophageal reflux disease); Glaucoma; Glaucoma; Helicobacter pylori (H. pylori); Hiatal hernia; History of bronchitis; History of frequent urinary tract infections; History of left shoulder fracture; Hyperlipidemia; Hypertension; Insomnia; Leg wound, left; MVP (mitral valve prolapse); Occasional tremors; Osteoarthritis; Osteopenia; Osteoporosis; Other and unspecified hyperlipidemia; Post-menopausal; Thyroid cancer ([place]); Thyroid nodule; and Tremor.   She has a past surgical history that includes right knee replacement (3/11); rt. eye cataract (05/28/07); Dilation and curettage of uterus (03/31/95); Cholecystectomy (1979); Ventral hernia repair (2/99); Bunionectomy  (08/1999); ORIF humerus fracture (05/30/2011); Eye surgery; Appendectomy; Fracture surgery; Thyroid lobectomy (Left, 07/27/2015); and Thyroidectomy (N/A, 08/06/2015).   Her family history includes Arthritis in her father; Cancer in her mother and sister; Heart attack (age of onset: 77) in her father; Heart disease in her father; Hip fracture in her mother; Hypertension in her sister and sister; Osteoporosis in her mother.She reports that she has never smoked. She has never used smokeless tobacco. She reports that she does not drink alcohol or use drugs.  Current Outpatient Prescriptions on File Prior to Visit  Medication Sig Dispense Refill  . aspirin 81 MG tablet Take 1 tablet (81 mg total) by mouth every other day. 30 tablet   . atenolol (TENORMIN) 50 MG tablet TAKE 1 TABLET DAILY 30 tablet 5  . calcium carbonate (OS-CAL - DOSED IN MG OF ELEMENTAL CALCIUM) 1250 (500 Ca) MG tablet Take 2 tablets (1,000 mg of elemental calcium total) by mouth 3 (three) times daily with meals. (Patient taking differently: Take 2 tablets by mouth 3 (three) times daily with meals. 5 a day) 50 tablet 2  . celecoxib (CELEBREX) 200 MG capsule TAKE (1) CAPSULE DAILY 30 capsule 1  . clorazepate (TRANXENE) 7.5 MG tablet TAKE 1 TABLET DAILY AS NEEDED (Patient taking differently: TAKE 1 TABLET DAILY AS NEEDED FOR ANXIETY) 30 tablet 1  . famotidine (PEPCID) 20 MG tablet Take 1 tablet (20 mg total) by mouth 2 (two) times daily. 60 tablet 1  . fluticasone (FLONASE) 50 MCG/ACT nasal spray Place 2 sprays into the nose daily. (Patient taking differently: Place 2 sprays into the nose daily as needed for allergies. ) 16 g 6  . gabapentin (  NEURONTIN) 100 MG capsule Take 1-2 capsules QHS as directed 180 capsule 1  . hydrocortisone (PROCTOZONE-HC) 2.5 % rectal cream APPLY TO RECTALLY 2 TIMES A DAY AS NEEDED 30 g 3  . levothyroxine (SYNTHROID, LEVOTHROID) 100 MCG tablet Take 100 mcg by mouth daily.   10  . Multiple Vitamins-Minerals (SENIOR  MULTIVITAMIN PLUS PO) Take by mouth daily.      . Omega-3 Fatty Acids (FISH OIL PO) Take 1 capsule by mouth daily.    . simvastatin (ZOCOR) 80 MG tablet TAKE ONE TABLET AT BEDTIME 30 tablet 5  . TRAVATAN Z 0.004 % SOLN ophthalmic solution INSTILL 1 DROP INTO RIGHT EYE AT BEDTIME 2.5 mL 2  . venlafaxine XR (EFFEXOR-XR) 75 MG 24 hr capsule TAKE (1) CAPSULE DAILY 90 capsule 1  . Vitamin D, Ergocalciferol, (DRISDOL) 50000 units CAPS capsule Take 50,000 Units by mouth every 7 (seven) days.    Marland Kitchen zolpidem (AMBIEN) 10 MG tablet TAKE 1/2 TABLET AT BEDTIME AS NEEDED 45 tablet 2   Current Facility-Administered Medications on File Prior to Visit  Medication Dose Route Frequency Provider Last Rate Last Dose  . cyanocobalamin ((VITAMIN B-12)) injection 1,000 mcg  1,000 mcg Intramuscular Q30 days Chipper Herb, MD   1,000 mcg at 12/20/16 2248    ROS Review of Systems  Constitutional: Negative for activity change, appetite change and fever.  HENT: Negative for congestion, rhinorrhea and sore throat.   Eyes: Negative for visual disturbance.  Respiratory: Negative for cough and shortness of breath.   Cardiovascular: Negative for chest pain and palpitations.  Gastrointestinal: Negative for abdominal pain, diarrhea and nausea.  Genitourinary: Negative for dysuria.  Musculoskeletal: Negative for arthralgias and myalgias.  Skin: Positive for wound (from bite, see HPI).    Objective:  BP 120/61   Pulse 67   Ht 5\' 3"  (1.6 m)   Wt 172 lb (78 kg)   BMI 30.47 kg/m   BP Readings from Last 3 Encounters:  01/17/17 120/61  12/30/16 (!) 145/74  12/02/16 127/67    Wt Readings from Last 3 Encounters:  01/17/17 172 lb (78 kg)  12/30/16 174 lb (78.9 kg)  12/02/16 172 lb (78 kg)     Physical Exam  Constitutional: She is oriented to person, place, and time. She appears well-developed and well-nourished. No distress.  Cardiovascular: Normal rate and regular rhythm.   Pulmonary/Chest: Breath sounds  normal.  Neurological: She is alert and oriented to person, place, and time.  Skin: Skin is warm and dry.  The site has 1 cm moderate erythema with central 1 mm nidus  Psychiatric: She has a normal mood and affect.       Assessment & Plan:   [place] was seen today for insect bite.  Diagnoses and all orders for this visit:  Insect bite, initial encounter  Other orders -     clobetasol cream (TEMOVATE) 0.05 %; Apply 1 application topically 2 (two) times daily. -     doxycycline (VIBRAMYCIN) 100 MG capsule; Take 1 capsule (100 mg total) by mouth 2 (two) times daily. -     triamcinolone cream (KENALOG) 0.1 %; Apply 1 application topically 3 (three) times daily. Avoid face and genitalia       I am having Ms. Letarte start on clobetasol cream, doxycycline, and triamcinolone cream. I am also having her maintain her Multiple Vitamins-Minerals (SENIOR MULTIVITAMIN PLUS PO), fluticasone, TRAVATAN Z, aspirin, clorazepate, Omega-3 Fatty Acids (FISH OIL PO), calcium carbonate, Vitamin D (Ergocalciferol), hydrocortisone, levothyroxine, gabapentin, simvastatin,  zolpidem, atenolol, venlafaxine XR, famotidine, and celecoxib. We will continue to administer cyanocobalamin.  Allergies as of 01/17/2017      Reactions   Alendronate Sodium Nausea Only   Latex Other (See Comments)   RA to latex 1982   Levofloxacin Nausea And Vomiting   Lipitor [atorvastatin Calcium] Other (See Comments)   myalgias   Lyrica [pregabalin] Other (See Comments)   Reaction unknown   Meloxicam    Vaginal itching    Sibutramine Hcl Monohydrate Other (See Comments)   Reaction unknown   Actonel [risedronate] Other (See Comments)   Bone pain and nausea      Medication List       Accurate as of 01/17/17 11:59 PM. Always use your most recent med list.          aspirin 81 MG tablet Take 1 tablet (81 mg total) by mouth every other day.   atenolol 50 MG tablet Commonly known as:  TENORMIN TAKE 1 TABLET DAILY     calcium carbonate 1250 (500 Ca) MG tablet Commonly known as:  OS-CAL - dosed in mg of elemental calcium Take 2 tablets (1,000 mg of elemental calcium total) by mouth 3 (three) times daily with meals.   celecoxib 200 MG capsule Commonly known as:  CELEBREX TAKE (1) CAPSULE DAILY   clobetasol cream 0.05 % Commonly known as:  TEMOVATE Apply 1 application topically 2 (two) times daily.   clorazepate 7.5 MG tablet Commonly known as:  TRANXENE TAKE 1 TABLET DAILY AS NEEDED   doxycycline 100 MG capsule Commonly known as:  VIBRAMYCIN Take 1 capsule (100 mg total) by mouth 2 (two) times daily.   famotidine 20 MG tablet Commonly known as:  PEPCID Take 1 tablet (20 mg total) by mouth 2 (two) times daily.   FISH OIL PO Take 1 capsule by mouth daily.   fluticasone 50 MCG/ACT nasal spray Commonly known as:  FLONASE Place 2 sprays into the nose daily.   gabapentin 100 MG capsule Commonly known as:  NEURONTIN Take 1-2 capsules QHS as directed   hydrocortisone 2.5 % rectal cream Commonly known as:  PROCTOZONE-HC APPLY TO RECTALLY 2 TIMES A DAY AS NEEDED   levothyroxine 100 MCG tablet Commonly known as:  SYNTHROID, LEVOTHROID Take 100 mcg by mouth daily.   SENIOR MULTIVITAMIN PLUS PO Take by mouth daily.   simvastatin 80 MG tablet Commonly known as:  ZOCOR TAKE ONE TABLET AT BEDTIME   TRAVATAN Z 0.004 % Soln ophthalmic solution Generic drug:  Travoprost (BAK Free) INSTILL 1 DROP INTO RIGHT EYE AT BEDTIME   triamcinolone cream 0.1 % Commonly known as:  KENALOG Apply 1 application topically 3 (three) times daily. Avoid face and genitalia   venlafaxine XR 75 MG 24 hr capsule Commonly known as:  EFFEXOR-XR TAKE (1) CAPSULE DAILY   Vitamin D (Ergocalciferol) 50000 units Caps capsule Commonly known as:  DRISDOL Take 50,000 Units by mouth every 7 (seven) days.   zolpidem 10 MG tablet Commonly known as:  AMBIEN TAKE 1/2 TABLET AT BEDTIME AS NEEDED      Meds ordered  this encounter  Medications  . clobetasol cream (TEMOVATE) 0.05 %    Sig: Apply 1 application topically 2 (two) times daily.    Dispense:  30 g    Refill:  2  . doxycycline (VIBRAMYCIN) 100 MG capsule    Sig: Take 1 capsule (100 mg total) by mouth 2 (two) times daily.    Dispense:  10 capsule  Refill:  0  . triamcinolone cream (KENALOG) 0.1 %    Sig: Apply 1 application topically 3 (three) times daily. Avoid face and genitalia    Dispense:  28.4 g    Refill:  0     Follow-up: Return if symptoms worsen or fail to improve.  Claretta Fraise, M.D.

## 2017-01-19 ENCOUNTER — Ambulatory Visit: Payer: Medicare Other

## 2017-01-24 ENCOUNTER — Ambulatory Visit (INDEPENDENT_AMBULATORY_CARE_PROVIDER_SITE_OTHER): Payer: Medicare Other | Admitting: *Deleted

## 2017-01-24 DIAGNOSIS — E538 Deficiency of other specified B group vitamins: Secondary | ICD-10-CM | POA: Diagnosis not present

## 2017-01-24 NOTE — Progress Notes (Signed)
Pt given Cyanocobalamin 1049mcg inj Pt tolerated well

## 2017-02-07 ENCOUNTER — Other Ambulatory Visit: Payer: Self-pay | Admitting: Family Medicine

## 2017-02-07 NOTE — Telephone Encounter (Signed)
Since this is controlled and only seen her for minor acute illnesses, see if Dr. Laurance Flatten would review the chart for her request.

## 2017-02-07 NOTE — Telephone Encounter (Signed)
[facility] seen 01/17/17  Dr Livia Snellen  If approved route to nurse to call into [facility]

## 2017-02-08 DIAGNOSIS — N200 Calculus of kidney: Secondary | ICD-10-CM | POA: Diagnosis not present

## 2017-02-08 DIAGNOSIS — R350 Frequency of micturition: Secondary | ICD-10-CM | POA: Diagnosis not present

## 2017-02-08 DIAGNOSIS — R351 Nocturia: Secondary | ICD-10-CM | POA: Diagnosis not present

## 2017-02-08 NOTE — Telephone Encounter (Signed)
Phoned pharm  

## 2017-02-08 NOTE — Telephone Encounter (Signed)
It is okay to refill this patient's Ambien for 6 months

## 2017-02-14 ENCOUNTER — Other Ambulatory Visit: Payer: Self-pay | Admitting: Family Medicine

## 2017-02-15 DIAGNOSIS — N200 Calculus of kidney: Secondary | ICD-10-CM | POA: Diagnosis not present

## 2017-02-15 DIAGNOSIS — N201 Calculus of ureter: Secondary | ICD-10-CM | POA: Diagnosis not present

## 2017-02-21 DIAGNOSIS — R350 Frequency of micturition: Secondary | ICD-10-CM | POA: Diagnosis not present

## 2017-02-21 DIAGNOSIS — R8271 Bacteriuria: Secondary | ICD-10-CM | POA: Diagnosis not present

## 2017-02-21 DIAGNOSIS — N200 Calculus of kidney: Secondary | ICD-10-CM | POA: Diagnosis not present

## 2017-02-24 ENCOUNTER — Ambulatory Visit: Payer: Medicare Other

## 2017-02-28 ENCOUNTER — Ambulatory Visit (INDEPENDENT_AMBULATORY_CARE_PROVIDER_SITE_OTHER): Payer: Medicare Other | Admitting: *Deleted

## 2017-02-28 DIAGNOSIS — E538 Deficiency of other specified B group vitamins: Secondary | ICD-10-CM

## 2017-02-28 NOTE — Progress Notes (Signed)
Pt given cyanocobalamin inj Tolerated well 

## 2017-03-14 DIAGNOSIS — B955 Unspecified streptococcus as the cause of diseases classified elsewhere: Secondary | ICD-10-CM | POA: Diagnosis not present

## 2017-03-14 DIAGNOSIS — R8271 Bacteriuria: Secondary | ICD-10-CM | POA: Diagnosis not present

## 2017-03-14 DIAGNOSIS — N390 Urinary tract infection, site not specified: Secondary | ICD-10-CM | POA: Diagnosis not present

## 2017-03-14 DIAGNOSIS — N201 Calculus of ureter: Secondary | ICD-10-CM | POA: Diagnosis not present

## 2017-03-15 ENCOUNTER — Other Ambulatory Visit: Payer: Medicare Other

## 2017-03-15 DIAGNOSIS — E538 Deficiency of other specified B group vitamins: Secondary | ICD-10-CM

## 2017-03-15 DIAGNOSIS — E559 Vitamin D deficiency, unspecified: Secondary | ICD-10-CM | POA: Diagnosis not present

## 2017-03-15 DIAGNOSIS — I10 Essential (primary) hypertension: Secondary | ICD-10-CM | POA: Diagnosis not present

## 2017-03-15 DIAGNOSIS — E7800 Pure hypercholesterolemia, unspecified: Secondary | ICD-10-CM

## 2017-03-15 DIAGNOSIS — C73 Malignant neoplasm of thyroid gland: Secondary | ICD-10-CM

## 2017-03-16 ENCOUNTER — Other Ambulatory Visit: Payer: Self-pay | Admitting: Urology

## 2017-03-16 LAB — BMP8+EGFR
BUN / CREAT RATIO: 18 (ref 12–28)
BUN: 12 mg/dL (ref 8–27)
CO2: 25 mmol/L (ref 20–29)
CREATININE: 0.68 mg/dL (ref 0.57–1.00)
Calcium: 8.1 mg/dL — ABNORMAL LOW (ref 8.7–10.3)
Chloride: 105 mmol/L (ref 96–106)
GFR, EST AFRICAN AMERICAN: 100 mL/min/{1.73_m2} (ref >59)
GFR, EST NON AFRICAN AMERICAN: 86 mL/min/{1.73_m2} (ref >59)
Glucose: 97 mg/dL (ref 65–99)
POTASSIUM: 3.9 mmol/L (ref 3.5–5.2)
SODIUM: 143 mmol/L (ref 134–144)

## 2017-03-16 LAB — HEPATIC FUNCTION PANEL
ALT: 12 IU/L (ref 0–32)
AST: 22 IU/L (ref 0–40)
Albumin: 4.1 g/dL (ref 3.5–4.8)
Alkaline Phosphatase: 116 IU/L (ref 39–117)
BILIRUBIN, DIRECT: 0.12 mg/dL (ref 0.00–0.40)
Bilirubin Total: 0.4 mg/dL (ref 0.0–1.2)
TOTAL PROTEIN: 6.1 g/dL (ref 6.0–8.5)

## 2017-03-16 LAB — CBC WITH DIFFERENTIAL/PLATELET
Basophils Absolute: 0 10*3/uL (ref 0.0–0.2)
Basos: 1 %
EOS (ABSOLUTE): 0.2 10*3/uL (ref 0.0–0.4)
EOS: 3 %
HEMATOCRIT: 36.3 % (ref 34.0–46.6)
HEMOGLOBIN: 11.8 g/dL (ref 11.1–15.9)
Immature Grans (Abs): 0 10*3/uL (ref 0.0–0.1)
Immature Granulocytes: 0 %
LYMPHS ABS: 1.4 10*3/uL (ref 0.7–3.1)
Lymphs: 27 %
MCH: 28.2 pg (ref 26.6–33.0)
MCHC: 32.5 g/dL (ref 31.5–35.7)
MCV: 87 fL (ref 79–97)
MONOCYTES: 9 %
MONOS ABS: 0.5 10*3/uL (ref 0.1–0.9)
NEUTROS ABS: 3.1 10*3/uL (ref 1.4–7.0)
Neutrophils: 60 %
Platelets: 251 10*3/uL (ref 150–379)
RBC: 4.18 x10E6/uL (ref 3.77–5.28)
RDW: 13.8 % (ref 12.3–15.4)
WBC: 5.1 10*3/uL (ref 3.4–10.8)

## 2017-03-16 LAB — THYROID PANEL WITH TSH
Free Thyroxine Index: 2.4 (ref 1.2–4.9)
T3 Uptake Ratio: 25 % (ref 24–39)
T4, Total: 9.4 ug/dL (ref 4.5–12.0)
TSH: 0.99 u[IU]/mL (ref 0.450–4.500)

## 2017-03-16 LAB — VITAMIN B12: VITAMIN B 12: 886 pg/mL (ref 232–1245)

## 2017-03-16 LAB — LIPID PANEL
CHOL/HDL RATIO: 2.9 ratio (ref 0.0–4.4)
Cholesterol, Total: 165 mg/dL (ref 100–199)
HDL: 57 mg/dL (ref >39)
LDL CALC: 89 mg/dL (ref 0–99)
TRIGLYCERIDES: 93 mg/dL (ref 0–149)
VLDL Cholesterol Cal: 19 mg/dL (ref 5–40)

## 2017-03-16 LAB — VITAMIN D 25 HYDROXY (VIT D DEFICIENCY, FRACTURES): VIT D 25 HYDROXY: 46.9 ng/mL (ref 30.0–100.0)

## 2017-03-17 ENCOUNTER — Other Ambulatory Visit: Payer: Self-pay | Admitting: Family Medicine

## 2017-03-21 ENCOUNTER — Ambulatory Visit (INDEPENDENT_AMBULATORY_CARE_PROVIDER_SITE_OTHER): Payer: Medicare Other | Admitting: Family Medicine

## 2017-03-21 ENCOUNTER — Encounter: Payer: Self-pay | Admitting: Family Medicine

## 2017-03-21 VITALS — BP 151/77 | HR 69 | Temp 97.3°F | Ht 63.0 in | Wt 173.0 lb

## 2017-03-21 DIAGNOSIS — C73 Malignant neoplasm of thyroid gland: Secondary | ICD-10-CM | POA: Diagnosis not present

## 2017-03-21 DIAGNOSIS — E559 Vitamin D deficiency, unspecified: Secondary | ICD-10-CM | POA: Diagnosis not present

## 2017-03-21 DIAGNOSIS — M797 Fibromyalgia: Secondary | ICD-10-CM

## 2017-03-21 DIAGNOSIS — E7800 Pure hypercholesterolemia, unspecified: Secondary | ICD-10-CM

## 2017-03-21 DIAGNOSIS — I10 Essential (primary) hypertension: Secondary | ICD-10-CM | POA: Diagnosis not present

## 2017-03-21 DIAGNOSIS — E538 Deficiency of other specified B group vitamins: Secondary | ICD-10-CM | POA: Diagnosis not present

## 2017-03-21 DIAGNOSIS — I712 Thoracic aortic aneurysm, without rupture: Secondary | ICD-10-CM

## 2017-03-21 DIAGNOSIS — I7121 Aneurysm of the ascending aorta, without rupture: Secondary | ICD-10-CM

## 2017-03-21 DIAGNOSIS — D692 Other nonthrombocytopenic purpura: Secondary | ICD-10-CM

## 2017-03-21 DIAGNOSIS — T148XXA Other injury of unspecified body region, initial encounter: Secondary | ICD-10-CM

## 2017-03-21 DIAGNOSIS — N200 Calculus of kidney: Secondary | ICD-10-CM

## 2017-03-21 MED ORDER — MUPIROCIN 2 % EX OINT: 1.0000 "application " | TOPICAL_OINTMENT | Freq: Two times a day (BID) | CUTANEOUS | 1 refills | Status: DC

## 2017-03-21 NOTE — Progress Notes (Signed)
Subjective:    Patient ID: [name], female    DOB: 11/15/1940, 76 y.o.   MRN: [id number]  HPI Pt here for follow up and management of chronic medical problems which includes hypertension and hyperlipidemia. She is taking medication regularly.The patient is doing well overall. She is still taking Prilosec for her gastroesophageal reflux disease and says this is not helping. She has ongoing wound of her left leg which we need to recheck today. She refuses to do any pelvic exams and Pap smears. Her lab work will be reviewed with her during the visit today. All cholesterol numbers were good and at goal with an LDL C being 89 and triglycerides being 93. The CBC results had a normal white blood cell count with a stable hemoglobin at 11.8 and an adequate platelet count. The blood sugar was good at 97 and the creatinine, the most important kidney function test remains within normal limits. The serum calcium runs low for this patient and this is typical for her we will continue to monitor this. The remainder of the electrolytes including potassium are good. The vitamin D level was good at 46.9. All thyroid function tests were within normal limits. And she will continue with her current thyroid replacement. All liver function tests were normal. The vitamin D level was excellent at 886. The patient is going to have surgery for a left percutaneous nephrolithotomy. The question was proposed by the surgeon to leave her aspirin off and this can certainly be done for 5 days prior to the surgery. The patient denies any chest pain or shortness of breath. She does continue to have trouble with belching and discomfort in her esophagus. She is also still taking Celebrex even though she is taking a proton pump inhibitor twice daily. We are he told her to stop the Celebrex and only take Tylenol and if the problem with the belching and the discomfort continued that she should get back in touch with [place] a couple weeks and let us  know that leaving the Celebrex off did not help. She has not seen any blood in the stool or had any black tarry bowel movements. She is passing her water without problems. About 10 days ago her dog punctured her left leg and now she has an open wound again on her left leg. We will change tube mupirocin ointment and make sure that she use saline soaks twice daily to clean the wound and apply the ointment. We will continue to follow-up on this at subsequent visits.     Patient Active Problem List   Diagnosis Date Noted  . Vitamin B 12 deficiency 03/01/2016  . Ascending aortic aneurysm ([place]) 08/20/2015  . Thyroid cancer ([place]) 07/30/2015  . Left thyroid nodule 07/27/2015  . Osteoporosis with fracture 04/16/2014  . At high risk for falls 04/16/2014  . Proximal humerus fracture 05/30/2011  . CHEST PAIN, PRECORDIAL 02/26/2010  . Hyperlipidemia 02/23/2010  . Depression 02/23/2010  . GLAUCOMA 02/23/2010  . CATARACTS 02/23/2010  . RHEUMATIC FEVER 02/23/2010  . Essential hypertension 02/23/2010  . MITRAL VALVE PROLAPSE 02/23/2010  . GERD 02/23/2010  . Osteoarthritis 02/23/2010  . Primary fibromyalgia syndrome 02/23/2010   Outpatient Encounter Prescriptions as of 03/21/2017  Medication Sig  . aspirin 81 MG tablet Take 1 tablet (81 mg total) by mouth every other day.  Marland Kitchen atenolol (TENORMIN) 50 MG tablet TAKE 1 TABLET DAILY  . calcium carbonate (OS-CAL - DOSED IN MG OF ELEMENTAL CALCIUM) 1250 (500 Ca) MG  tablet Take 2 tablets (1,000 mg of elemental calcium total) by mouth 3 (three) times daily with meals. (Patient taking differently: Take 2 tablets by mouth 3 (three) times daily with meals. 5 a day)  . celecoxib (CELEBREX) 200 MG capsule TAKE (1) CAPSULE DAILY  . clobetasol cream (TEMOVATE) 9.76 % Apply 1 application topically 2 (two) times daily.  . clorazepate (TRANXENE) 7.5 MG tablet TAKE 1 TABLET DAILY AS NEEDED (Patient taking differently: TAKE 1 TABLET DAILY AS NEEDED FOR ANXIETY)  .  famotidine (PEPCID) 20 MG tablet Take 1 tablet (20 mg total) by mouth 2 (two) times daily.  . fluticasone (FLONASE) 50 MCG/ACT nasal spray Place 2 sprays into the nose daily. (Patient taking differently: Place 2 sprays into the nose daily as needed for allergies. )  . gabapentin (NEURONTIN) 100 MG capsule Take 1-2 capsules QHS as directed  . levothyroxine (SYNTHROID, LEVOTHROID) 100 MCG tablet Take 100 mcg by mouth daily.   . Multiple Vitamins-Minerals (SENIOR MULTIVITAMIN PLUS PO) Take by mouth daily.    . Omega-3 Fatty Acids (FISH OIL PO) Take 1 capsule by mouth daily.  Marland Kitchen PROCTOZONE-HC 2.5 % rectal cream APPLY TO RECTALLY 2 TIMES A DAY AS NEEDED  . simvastatin (ZOCOR) 80 MG tablet TAKE ONE TABLET AT BEDTIME  . TRAVATAN Z 0.004 % SOLN ophthalmic solution INSTILL 1 DROP INTO RIGHT EYE AT BEDTIME  . triamcinolone cream (KENALOG) 0.1 % Apply 1 application topically 3 (three) times daily. Avoid face and genitalia  . venlafaxine XR (EFFEXOR-XR) 75 MG 24 hr capsule TAKE (1) CAPSULE DAILY  . Vitamin D, Ergocalciferol, (DRISDOL) 50000 units CAPS capsule Take 50,000 Units by mouth every 7 (seven) days.  Marland Kitchen zolpidem (AMBIEN) 10 MG tablet TAKE 1/2 TABLET AT BEDTIME AS NEEDED  . [DISCONTINUED] doxycycline (VIBRAMYCIN) 100 MG capsule Take 1 capsule (100 mg total) by mouth 2 (two) times daily.   Facility-Administered Encounter Medications as of 03/21/2017  Medication  . cyanocobalamin ((VITAMIN B-12)) injection 1,000 mcg      Review of Systems  Constitutional: Negative.   HENT: Negative.   Eyes: Negative.   Respiratory: Negative.   Cardiovascular: Negative.   Gastrointestinal: Negative.        GERD  Endocrine: Negative.   Genitourinary: Negative.   Musculoskeletal: Negative.   Skin: Positive for wound (left lower leg).  Allergic/Immunologic: Negative.   Neurological: Negative.   Hematological: Negative.   Psychiatric/Behavioral: Negative.        Objective:   Physical Exam  Constitutional:  She is oriented to person, place, and time. She appears well-developed and well-nourished. No distress.  The patient is pleasant and alert  HENT:  Head: Normocephalic and atraumatic.  Right Ear: External ear normal.  Left Ear: External ear normal.  Mouth/Throat: Oropharynx is clear and moist. No oropharyngeal exudate.  Slight nasal congestion bilaterally  Eyes: Pupils are equal, round, and reactive to light. Conjunctivae and EOM are normal. Right eye exhibits no discharge. Left eye exhibits no discharge. No scleral icterus.  Neck: Normal range of motion. Neck supple. No thyromegaly present.  No bruits thyromegaly or anterior cervical adenopathy  Cardiovascular: Normal rate, regular rhythm, normal heart sounds and intact distal pulses.   No murmur heard. Heart is regular at 72/m  Pulmonary/Chest: Effort normal and breath sounds normal. No respiratory distress. She has no wheezes. She has no rales.  Clear anteriorly and posteriorly  Abdominal: Soft. Bowel sounds are normal. She exhibits no mass. There is no tenderness. There is no rebound and no  guarding.  Slight epigastric tenderness with midline incisional scar from previous surgery  Musculoskeletal: Normal range of motion. She exhibits no edema.  Right knee replacement  Lymphadenopathy:    She has no cervical adenopathy.  Neurological: She is alert and oriented to person, place, and time. She has normal reflexes. No cranial nerve deficit.  Skin: Skin is warm and dry. No rash noted.  There is a puncture wound of the lower leg upper aspect anteriorly. The skin looks healthy but it is a fairly deep puncture wound and this is been here for about 10 days. The patient will continue with saline and will apply Bactroban ointment after saline cleansing twice daily. We will ask her to come back in a couple weeks for follow-up.  Psychiatric: She has a normal mood and affect. Her behavior is normal. Judgment and thought content normal.  Nursing note  and vitals reviewed.  BP (!) 151/77   Pulse 69   Temp (!) 97.3 F (36.3 C) (Oral)   Ht 5\' 3"  (1.6 m)   Wt 173 lb (78.5 kg)   BMI 30.65 kg/m         Assessment & Plan:  1. Essential hypertension -The blood pressure is slightly elevated today and we will continue to monitor this it was checked on 2 occasions.  2. Pure hypercholesterolemia -Continue current treatment and aggressive therapeutic lifestyle changes  3. Vitamin D deficiency -Continue with vitamin D replacement  4. Thyroid cancer [facility]) -Follow-up regularly with surgeon  5. Puncture wound -Clean with saline twice daily and apply Bactroban ointment after cleaning and recheck wound in a couple weeks  6. Vitamin B 12 deficiency -Continue with B12 injections as doing as B12 level was good  7. Ascending aortic aneurysm ([place]) -Continue aggressive therapeutic lifestyle changes and simvastatin and omega-3 fatty acids  8. Senile purpura ([facility]) -The CBC and platelet counts were good but the patient does continue to have purpura issues.  9. Primary fibromyalgia syndrome -Continue with pain medicine as needed  10. Left nephrolithiasis -Follow-up with urology as planned  Patient Instructions                       Medicare Annual Wellness Visit  [facility] and the medical providers at [facility] strive to bring you the best medical care.  In doing so we not only want to address your current medical conditions and concerns but also to detect new conditions early and prevent illness, disease and health-related problems.    Medicare offers a yearly Wellness Visit which allows our clinical staff to assess your need for preventative services including immunizations, lifestyle education, counseling to decrease risk of preventable diseases and screening for fall risk and other medical concerns.    This visit is provided free of charge (no copay) for all Medicare recipients. The clinical pharmacists at  [facility] have begun to conduct these Wellness Visits which will also include a thorough review of all your medications.    As you primary medical provider recommend that you make an appointment for your Annual Wellness Visit if you have not done so already this year.  You may set up this appointment before you leave today or you may call back ([phone]) and schedule an appointment.  Please make sure when you call that you mention that you are scheduling your Annual Wellness Visit with the clinical pharmacist so that the appointment may be made for the proper length of time.  Continue current medications. Continue good therapeutic lifestyle changes which include good diet and exercise. Fall precautions discussed with patient. If an FOBT was given today- please return it to our front desk. If you are over 4 years old - you may need Prevnar 28 or the adult Pneumonia vaccine.  **Flu shots are available--- please call and schedule a FLU-CLINIC appointment**  After your visit with [place] today you will receive a survey in the mail or online from [employer] regarding your care with [place]. Please take a moment to fill this out. Your feedback is very important to [place] as you can help [place] better understand your patient needs as well as improve your experience and satisfaction. WE CARE ABOUT YOU!!!   Continue saline soaks and apply Bactroban ointment to wound twice daily Follow-up with urologist for planned percutaneous kidney stone removal from left kidney Continue to be careful do not put your self at risk for accidents animal scratches and falls The flu shot today you'll receive the day may make your arm sore Discontinue using Celebrex as this could be aggravating your hernia and causing you have more belching. Only take Tylenol for pain.    Arrie Senate MD

## 2017-03-21 NOTE — Addendum Note (Signed)
Addended by: Zannie Cove on: 03/21/2017 11:22 AM   Modules accepted: Orders

## 2017-03-21 NOTE — Patient Instructions (Addendum)
Medicare Annual Wellness Visit  [facility] and the medical providers at [facility] strive to bring you the best medical care.  In doing so we not only want to address your current medical conditions and concerns but also to detect new conditions early and prevent illness, disease and health-related problems.    Medicare offers a yearly Wellness Visit which allows our clinical staff to assess your need for preventative services including immunizations, lifestyle education, counseling to decrease risk of preventable diseases and screening for fall risk and other medical concerns.    This visit is provided free of charge (no copay) for all Medicare recipients. The clinical pharmacists at [facility] have begun to conduct these Wellness Visits which will also include a thorough review of all your medications.    As you primary medical provider recommend that you make an appointment for your Annual Wellness Visit if you have not done so already this year.  You may set up this appointment before you leave today or you may call back ([phone]) and schedule an appointment.  Please make sure when you call that you mention that you are scheduling your Annual Wellness Visit with the clinical pharmacist so that the appointment may be made for the proper length of time.     Continue current medications. Continue good therapeutic lifestyle changes which include good diet and exercise. Fall precautions discussed with patient. If an FOBT was given today- please return it to our front desk. If you are over 29 years old - you may need Prevnar 48 or the adult Pneumonia vaccine.  **Flu shots are available--- please call and schedule a FLU-CLINIC appointment**  After your visit with [place] today you will receive a survey in the mail or online from [employer] regarding your care with [place]. Please take a moment to fill this out. Your feedback is very  important to [place] as you can help [place] better understand your patient needs as well as improve your experience and satisfaction. WE CARE ABOUT YOU!!!   Continue saline soaks and apply Bactroban ointment to wound twice daily Follow-up with urologist for planned percutaneous kidney stone removal from left kidney Continue to be careful do not put your self at risk for accidents animal scratches and falls The flu shot today you'll receive the day may make your arm sore Discontinue using Celebrex as this could be aggravating your hernia and causing you have more belching. Only take Tylenol for pain.

## 2017-03-24 NOTE — Patient Instructions (Addendum)
[name]  03/24/2017   Your procedure is scheduled on: 04-07-17  Report to [facility] Main  Entrance Take [place]  elevators to 3rd floor to  [place] at [place].   Call this number if you have problems the morning of surgery [phone]    Remember: ONLY 1 PERSON MAY GO WITH YOU TO SHORT STAY TO GET  READY MORNING OF YOUR SURGERY.  Do not eat food After Midnight on Wednesday 04-05-17. Drink plenty of clear liquids all day Thursday 04-06-17 and follow all bowel prep instructions from your surgeon. Nothing by mouth after midnight on Thursday !!     Take these medicines the morning of surgery with A SIP OF WATER: atenolol, levothyroxine, venlafaxine(effexor), tranxene if needed, nasal spray if needed, tylenol if needed, ranitidine(zantac)                                 You may not have any metal on your body including hair pins and              piercings  Do not wear jewelry, make-up, lotions, powders or perfumes, deodorant             Do not wear nail polish.  Do not shave  48 hours prior to surgery.                Do not bring valuables to the hospital. [place].  Contacts, dentures or bridgework may not be worn into surgery.  Leave suitcase in the car. After surgery it may be brought to your room.               Please read over the following fact sheets you were given: _____________________________________________________________________   CLEAR LIQUID DIET   Foods Allowed                                                                     Foods Excluded  Coffee and tea, regular and decaf                             liquids that you cannot  Plain Jell-O in any flavor                                             see through such as: Fruit ices (not with fruit pulp)                                     milk, soups, orange juice  Iced Popsicles                                    All  solid  food Carbonated beverages, regular and diet                                    Cranberry, grape and apple juices Sports drinks like Gatorade Lightly seasoned clear broth or consume(fat free) Sugar, honey syrup  Sample Menu Breakfast                                Lunch                                     Supper Cranberry juice                    Beef broth                            Chicken broth Jell-O                                     Grape juice                           Apple juice Coffee or tea                        Jell-O                                      Popsicle                                                Coffee or tea                        Coffee or tea  _____________________________________________________________________  [facility] - Preparing for Surgery Before surgery, you can play an important role.  Because skin is not sterile, your skin needs to be as free of germs as possible.  You can reduce the number of germs on your skin by washing with CHG (chlorahexidine gluconate) soap before surgery.  CHG is an antiseptic cleaner which kills germs and bonds with the skin to continue killing germs even after washing. Please DO NOT use if you have an allergy to CHG or antibacterial soaps.  If your skin becomes reddened/irritated stop using the CHG and inform your nurse when you arrive at Short Stay. Do not shave (including legs and underarms) for at least 48 hours prior to the first CHG shower.  You may shave your face/neck. Please follow these instructions carefully:  1.  Shower with CHG Soap the night before surgery and the  morning of Surgery.  2.  If you choose to wash your hair, wash your hair first as usual with your  normal  shampoo.  3.  After you shampoo, rinse your hair and body thoroughly to remove the  shampoo.  4.  Use CHG as you would any other liquid soap.  You can apply chg directly  to the skin and wash                       Gently  with a scrungie or clean washcloth.  5.  Apply the CHG Soap to your body ONLY FROM THE NECK DOWN.   Do not use on face/ open                           Wound or open sores. Avoid contact with eyes, ears mouth and genitals (private parts).                       Wash face,  Genitals (private parts) with your normal soap.             6.  Wash thoroughly, paying special attention to the area where your surgery  will be performed.  7.  Thoroughly rinse your body with warm water from the neck down.  8.  DO NOT shower/wash with your normal soap after using and rinsing off  the CHG Soap.                9.  Pat yourself dry with a clean towel.            10.  Wear clean pajamas.            11.  Place clean sheets on your bed the night of your first shower and do not  sleep with pets. Day of Surgery : Do not apply any lotions/deodorants the morning of surgery.  Please wear clean clothes to the hospital/surgery center.  FAILURE TO FOLLOW THESE INSTRUCTIONS MAY RESULT IN THE CANCELLATION OF YOUR SURGERY PATIENT SIGNATURE_________________________________  NURSE SIGNATURE__________________________________  ________________________________________________________________________  WHAT IS A BLOOD TRANSFUSION? Blood Transfusion Information  A transfusion is the replacement of blood or some of its parts. Blood is made up of multiple cells which provide different functions.  Red blood cells carry oxygen and are used for blood loss replacement.  White blood cells fight against infection.  Platelets control bleeding.  Plasma helps clot blood.  Other blood products are available for specialized needs, such as hemophilia or other clotting disorders. BEFORE THE TRANSFUSION  Who gives blood for transfusions?   Healthy volunteers who are fully evaluated to make sure their blood is safe. This is blood bank blood. Transfusion therapy is the safest it has ever been in the practice of medicine. Before blood is  taken from a donor, a complete history is taken to make sure that person has no history of diseases nor engages in risky social behavior (examples are intravenous drug use or sexual activity with multiple partners). The donor's travel history is screened to minimize risk of transmitting infections, such as malaria. The donated blood is tested for signs of infectious diseases, such as HIV and hepatitis. The blood is then tested to be sure it is compatible with you in order to minimize the chance of a transfusion reaction. If you or a relative donates blood, this is often done in anticipation of surgery and is not appropriate for emergency situations. It takes many days to process the donated blood. RISKS AND COMPLICATIONS Although transfusion therapy is very safe and saves many lives, the main dangers of transfusion include:   Getting an infectious disease.  Developing a transfusion reaction.  This is an allergic reaction to something in the blood you were given. Every precaution is taken to prevent this. The decision to have a blood transfusion has been considered carefully by your caregiver before blood is given. Blood is not given unless the benefits outweigh the risks. AFTER THE TRANSFUSION  Right after receiving a blood transfusion, you will usually feel much better and more energetic. This is especially true if your red blood cells have gotten low (anemic). The transfusion raises the level of the red blood cells which carry oxygen, and this usually causes an energy increase.  The nurse administering the transfusion will monitor you carefully for complications. HOME CARE INSTRUCTIONS  No special instructions are needed after a transfusion. You may find your energy is better. Speak with your caregiver about any limitations on activity for underlying diseases you may have. SEEK MEDICAL CARE IF:   Your condition is not improving after your transfusion.  You develop redness or irritation at the  intravenous (IV) site. SEEK IMMEDIATE MEDICAL CARE IF:  Any of the following symptoms occur over the next 12 hours:  Shaking chills.  You have a temperature by mouth above 102 F (38.9 C), not controlled by medicine.  Chest, back, or muscle pain.  People around you feel you are not acting correctly or are confused.  Shortness of breath or difficulty breathing.  Dizziness and fainting.  You get a rash or develop hives.  You have a decrease in urine output.  Your urine turns a dark color or changes to pink, red, or brown. Any of the following symptoms occur over the next 10 days:  You have a temperature by mouth above 102 F (38.9 C), not controlled by medicine.  Shortness of breath.  Weakness after normal activity.  The white part of the eye turns yellow (jaundice).  You have a decrease in the amount of urine or are urinating less often.  Your urine turns a dark color or changes to pink, red, or brown. Document Released: 06/10/2000 Document Revised: 09/05/2011 Document Reviewed: 01/28/2008 [facility] Patient Information 2014 [place], [place].  _______________________________________________________________________

## 2017-03-24 NOTE — Progress Notes (Signed)
LOV/ clearance Dr Redge Gainer 03-21-17 epic  [facility] cardiothoracic Dr Tharon Aquas Tigt 11-09-16 epic  LABS 03-15-17 epic: CBCdiff, BMP, hepatic fx panel   CT angio chest 10-19-16 epic  LLE VAS [place] 07-07-15 epic

## 2017-03-28 ENCOUNTER — Encounter (HOSPITAL_COMMUNITY)
Admission: RE | Admit: 2017-03-28 | Discharge: 2017-03-28 | Disposition: A | Discharge status: Home / Self Care | Payer: Medicare Other | Source: Ambulatory Visit | Attending: Urology | Admitting: Urology

## 2017-03-28 ENCOUNTER — Encounter (HOSPITAL_COMMUNITY): Payer: Self-pay

## 2017-03-28 DIAGNOSIS — Z01812 Encounter for preprocedural laboratory examination: Secondary | ICD-10-CM | POA: Insufficient documentation

## 2017-03-28 DIAGNOSIS — I2510 Atherosclerotic heart disease of native coronary artery without angina pectoris: Secondary | ICD-10-CM | POA: Diagnosis not present

## 2017-03-28 DIAGNOSIS — I10 Essential (primary) hypertension: Secondary | ICD-10-CM | POA: Diagnosis not present

## 2017-03-28 DIAGNOSIS — Z01810 Encounter for preprocedural cardiovascular examination: Secondary | ICD-10-CM | POA: Insufficient documentation

## 2017-03-28 HISTORY — DX: Unspecified chronic bronchitis: J42

## 2017-03-28 LAB — URINALYSIS, ROUTINE W REFLEX MICROSCOPIC
Bilirubin Urine: NEGATIVE
Glucose, UA: NEGATIVE mg/dL
KETONES UR: NEGATIVE mg/dL
Nitrite: NEGATIVE
PROTEIN: 30 mg/dL — AB
Specific Gravity, Urine: 1.005 (ref 1.005–1.030)
pH: 6 (ref 5.0–8.0)

## 2017-03-28 NOTE — Progress Notes (Signed)
UA result routed via epic to Dr Debby Bud fax

## 2017-03-29 NOTE — Progress Notes (Signed)
Urine culture result routed via epic to Dr Louis Meckel

## 2017-03-30 LAB — URINE CULTURE

## 2017-03-31 ENCOUNTER — Ambulatory Visit: Payer: Medicare Other

## 2017-04-04 ENCOUNTER — Encounter: Payer: Self-pay | Admitting: Family Medicine

## 2017-04-04 ENCOUNTER — Ambulatory Visit (INDEPENDENT_AMBULATORY_CARE_PROVIDER_SITE_OTHER): Payer: Medicare Other | Admitting: Family Medicine

## 2017-04-04 VITALS — BP 121/69 | HR 77 | Temp 97.9°F | Ht 64.0 in | Wt 169.0 lb

## 2017-04-04 DIAGNOSIS — T148XXA Other injury of unspecified body region, initial encounter: Secondary | ICD-10-CM

## 2017-04-04 DIAGNOSIS — S81802A Unspecified open wound, left lower leg, initial encounter: Secondary | ICD-10-CM | POA: Diagnosis not present

## 2017-04-04 DIAGNOSIS — H40002 Preglaucoma, unspecified, left eye: Secondary | ICD-10-CM | POA: Diagnosis not present

## 2017-04-04 DIAGNOSIS — H401211 Low-tension glaucoma, right eye, mild stage: Secondary | ICD-10-CM | POA: Diagnosis not present

## 2017-04-04 MED ORDER — HYDROCORTISONE ACETATE 25 MG RE SUPP: 25.0000 mg | Freq: Two times a day (BID) | RECTAL | 1 refills | Status: DC | PRN

## 2017-04-04 NOTE — Progress Notes (Signed)
Subjective:    Patient ID: [name], female    DOB: 06/28/1940, 76 y.o.   MRN: [id number]  HPI Patient here today for 2 week follow up on left lower leg wound. It is looking better today. The patient has been doing well with her leg and it continues to heal slowly with minimal redness and drainage.    Patient Active Problem List   Diagnosis Date Noted  . Vitamin B 12 deficiency 03/01/2016  . Ascending aortic aneurysm ([place]) 08/20/2015  . Thyroid cancer ([place]) 07/30/2015  . Left thyroid nodule 07/27/2015  . Osteoporosis with fracture 04/16/2014  . At high risk for falls 04/16/2014  . Proximal humerus fracture 05/30/2011  . CHEST PAIN, PRECORDIAL 02/26/2010  . Hyperlipidemia 02/23/2010  . Depression 02/23/2010  . GLAUCOMA 02/23/2010  . CATARACTS 02/23/2010  . RHEUMATIC FEVER 02/23/2010  . Essential hypertension 02/23/2010  . MITRAL VALVE PROLAPSE 02/23/2010  . GERD 02/23/2010  . Osteoarthritis 02/23/2010  . Primary fibromyalgia syndrome 02/23/2010   Outpatient Encounter Prescriptions as of 04/04/2017  Medication Sig  . acetaminophen (TYLENOL) 650 MG CR tablet Take 650 mg by mouth every 8 (eight) hours as needed for pain.  Marland Kitchen aspirin 81 MG tablet Take 1 tablet (81 mg total) by mouth every other day. (Patient taking differently: Take 81 mg by mouth at bedtime. )  . atenolol (TENORMIN) 50 MG tablet TAKE 1 TABLET DAILY  . Bisacodyl (CORRECTOL PO) Take 2 tablets by mouth every other day.  . calcium carbonate (OS-CAL - DOSED IN MG OF ELEMENTAL CALCIUM) 1250 (500 Ca) MG tablet Take 2 tablets (1,000 mg of elemental calcium total) by mouth 3 (three) times daily with meals. (Patient taking differently: Take 2 tablets by mouth 3 (three) times daily with meals. 5 a day)  . celecoxib (CELEBREX) 200 MG capsule TAKE (1) CAPSULE DAILY  . clobetasol cream (TEMOVATE) 2.95 % Apply 1 application topically 2 (two) times daily. (Patient taking differently: Apply 1 application topically daily as  needed (rash). )  . clorazepate (TRANXENE) 7.5 MG tablet TAKE 1 TABLET DAILY AS NEEDED (Patient taking differently: TAKE 1 TABLET DAILY AS NEEDED FOR ANXIETY)  . famotidine (PEPCID) 20 MG tablet Take 1 tablet (20 mg total) by mouth 2 (two) times daily.  . fluticasone (FLONASE) 50 MCG/ACT nasal spray Place 2 sprays into the nose daily. (Patient taking differently: Place 1 spray into the nose daily as needed for allergies. )  . gabapentin (NEURONTIN) 100 MG capsule Take 1-2 capsules QHS as directed  . levothyroxine (SYNTHROID, LEVOTHROID) 100 MCG tablet Take 100 mcg by mouth daily.   . mupirocin ointment (BACTROBAN) 2 % Apply 1 application topically 2 (two) times daily. (Patient taking differently: Apply 1 application topically 2 (two) times daily as needed (rash). )  . nystatin (MYCOSTATIN/NYSTOP) powder Apply 1 g topically daily as needed (rash).   . Omega-3 Fatty Acids (FISH OIL) 600 MG CAPS Take 600 mg by mouth daily.  Marland Kitchen PROCTOZONE-HC 2.5 % rectal cream APPLY TO RECTALLY 2 TIMES A DAY AS NEEDED (Patient taking differently: APPLY TO RECTALLY 1 TIME A DAY)  . ranitidine (ZANTAC) 150 MG tablet Take 150 mg by mouth 2 (two) times daily.  . simvastatin (ZOCOR) 80 MG tablet TAKE ONE TABLET AT BEDTIME (Patient taking differently: TAKE ONE HALF TABLET AT BEDTIME)  . TRAVATAN Z 0.004 % SOLN ophthalmic solution INSTILL 1 DROP INTO RIGHT EYE AT BEDTIME (Patient taking differently: INSTILL 1 DROP INTO BOTH EYES AT BEDTIME)  .  triamcinolone cream (KENALOG) 0.1 % Apply 1 application topically 3 (three) times daily. Avoid face and genitalia  . venlafaxine XR (EFFEXOR-XR) 75 MG 24 hr capsule TAKE (1) CAPSULE DAILY (Patient taking differently: Take 75 mg by mouth daily with breakfast. TAKE (1) CAPSULE DAILY)  . Vitamin D, Ergocalciferol, (DRISDOL) 50000 units CAPS capsule Take 50,000 Units by mouth every 7 (seven) days. On Sundays  . zolpidem (AMBIEN) 10 MG tablet TAKE 1/2 TABLET AT BEDTIME AS NEEDED (Patient taking  differently: TAKE 1/2 TABLET AT BEDTIME)  . hydrocortisone (ANUSOL-HC) 25 MG suppository Place 1 suppository (25 mg total) rectally 2 (two) times daily as needed for hemorrhoids or anal itching.   Facility-Administered Encounter Medications as of 04/04/2017  Medication  . cyanocobalamin ((VITAMIN B-12)) injection 1,000 mcg      Review of Systems  Constitutional: Negative.   HENT: Negative.   Eyes: Negative.   Respiratory: Negative.   Cardiovascular: Negative.   Gastrointestinal: Negative.   Endocrine: Negative.   Genitourinary: Negative.   Musculoskeletal: Negative.   Skin: Positive for wound (left lower leg wound).  Allergic/Immunologic: Negative.   Neurological: Negative.   Hematological: Negative.   Psychiatric/Behavioral: Negative.        Objective:   Physical Exam  Constitutional: She is oriented to person, place, and time. She appears well-developed and well-nourished. No distress.  HENT:  Head: Normocephalic and atraumatic.  Eyes: Pupils are equal, round, and reactive to light. Conjunctivae and EOM are normal. Right eye exhibits no discharge. Left eye exhibits no discharge. No scleral icterus.  Neck: Normal range of motion.  Musculoskeletal: Normal range of motion. She exhibits no edema.  The wound on the left leg continues to get smaller and there is minimal redness and minimal drainage. She will continue with her current treatment regimen and we will recheck this one more time in one month if the wound is still open.  Neurological: She is alert and oriented to person, place, and time.  Skin: Skin is warm and dry. No rash noted. No erythema.  Puncture wound left lower leg is healing appropriately  Psychiatric: She has a normal mood and affect. Her behavior is normal. Judgment and thought content normal.  Nursing note and vitals reviewed.   BP 121/69 (BP Location: Left Arm)   Pulse 77   Temp 97.9 F (36.6 C) (Oral)   Ht 5\' 4"  (1.626 m)   Wt 169 lb (76.7 kg)    BMI 29.01 kg/m        Assessment & Plan:  1. Puncture wound -Continue current treatment with dressings and recheck in 4 weeks  Patient Instructions  Continue current treatment regimen with Betadine and dressings Recheck wound in 4 weeks  Arrie Senate MD

## 2017-04-04 NOTE — Patient Instructions (Signed)
Continue current treatment regimen with Betadine and dressings Recheck wound in 4 weeks

## 2017-04-05 ENCOUNTER — Ambulatory Visit (INDEPENDENT_AMBULATORY_CARE_PROVIDER_SITE_OTHER): Payer: Medicare Other | Admitting: *Deleted

## 2017-04-05 DIAGNOSIS — E538 Deficiency of other specified B group vitamins: Secondary | ICD-10-CM | POA: Diagnosis not present

## 2017-04-05 NOTE — Progress Notes (Signed)
Vitamin B12 injection given and patient tolerated well.  

## 2017-04-05 NOTE — Patient Instructions (Signed)
Cyanocobalamin, Vitamin B12 injection What is this medicine? CYANOCOBALAMIN (sye an oh koe BAL a min) is a man made form of vitamin B12. Vitamin B12 is used in the growth of healthy blood cells, nerve cells, and proteins in the body. It also helps with the metabolism of fats and carbohydrates. This medicine is used to treat people who can not absorb vitamin B12. This medicine may be used for other purposes; ask your health care provider or pharmacist if you have questions. COMMON BRAND NAME(S): B-12 Compliance Kit, B-12 Injection Kit, Cyomin, LA-12, Nutri-Twelve, Physicians EZ Use B-12, Primabalt What should I tell my health care provider before I take this medicine? They need to know if you have any of these conditions: -kidney disease -Leber's disease -megaloblastic anemia -an unusual or allergic reaction to cyanocobalamin, cobalt, other medicines, foods, dyes, or preservatives -pregnant or trying to get pregnant -breast-feeding How should I use this medicine? This medicine is injected into a muscle or deeply under the skin. It is usually given by a health care professional in a clinic or doctor's office. However, your doctor may teach you how to inject yourself. Follow all instructions. Talk to your pediatrician regarding the use of this medicine in children. Special care may be needed. Overdosage: If you think you have taken too much of this medicine contact a poison control center or emergency room at once. NOTE: This medicine is only for you. Do not share this medicine with others. What if I miss a dose? If you are given your dose at a clinic or doctor's office, call to reschedule your appointment. If you give your own injections and you miss a dose, take it as soon as you can. If it is almost time for your next dose, take only that dose. Do not take double or extra doses. What may interact with this medicine? -colchicine -heavy alcohol intake This list may not describe all possible  interactions. Give your health care provider a list of all the medicines, herbs, non-prescription drugs, or dietary supplements you use. Also tell them if you smoke, drink alcohol, or use illegal drugs. Some items may interact with your medicine. What should I watch for while using this medicine? Visit your doctor or health care professional regularly. You may need blood work done while you are taking this medicine. You may need to follow a special diet. Talk to your doctor. Limit your alcohol intake and avoid smoking to get the best benefit. What side effects may I notice from receiving this medicine? Side effects that you should report to your doctor or health care professional as soon as possible: -allergic reactions like skin rash, itching or hives, swelling of the face, lips, or tongue -blue tint to skin -chest tightness, pain -difficulty breathing, wheezing -dizziness -red, swollen painful area on the leg Side effects that usually do not require medical attention (report to your doctor or health care professional if they continue or are bothersome): -diarrhea -headache This list may not describe all possible side effects. Call your doctor for medical advice about side effects. You may report side effects to FDA at 1-800-FDA-1088. Where should I keep my medicine? Keep out of the reach of children. Store at room temperature between 15 and 30 degrees C (59 and 85 degrees F). Protect from light. Throw away any unused medicine after the expiration date. NOTE: This sheet is a summary. It may not cover all possible information. If you have questions about this medicine, talk to your doctor, pharmacist, or   health care provider.  2018 Elsevier/Gold Standard (2007-09-24 22:10:20)  

## 2017-04-07 ENCOUNTER — Ambulatory Visit (HOSPITAL_COMMUNITY): Payer: Medicare Other | Admitting: Certified Registered Nurse Anesthetist

## 2017-04-07 ENCOUNTER — Encounter (HOSPITAL_COMMUNITY)
Admission: RE | Disposition: A | Discharge status: Home / Self Care | Payer: Self-pay | Source: Ambulatory Visit | Attending: Urology

## 2017-04-07 ENCOUNTER — Observation Stay (HOSPITAL_COMMUNITY)
Admission: RE | Admit: 2017-04-07 | Discharge: 2017-04-09 | Disposition: A | Discharge status: Home / Self Care | Payer: Medicare Other | Source: Ambulatory Visit | Attending: Urology | Admitting: Urology

## 2017-04-07 ENCOUNTER — Ambulatory Visit (HOSPITAL_COMMUNITY): Payer: Medicare Other

## 2017-04-07 ENCOUNTER — Encounter (HOSPITAL_COMMUNITY): Payer: Self-pay

## 2017-04-07 DIAGNOSIS — Z888 Allergy status to other drugs, medicaments and biological substances status: Secondary | ICD-10-CM | POA: Diagnosis not present

## 2017-04-07 DIAGNOSIS — Z791 Long term (current) use of non-steroidal anti-inflammatories (NSAID): Secondary | ICD-10-CM | POA: Diagnosis not present

## 2017-04-07 DIAGNOSIS — N200 Calculus of kidney: Secondary | ICD-10-CM | POA: Diagnosis not present

## 2017-04-07 DIAGNOSIS — N201 Calculus of ureter: Secondary | ICD-10-CM | POA: Diagnosis not present

## 2017-04-07 DIAGNOSIS — Z7982 Long term (current) use of aspirin: Secondary | ICD-10-CM | POA: Diagnosis not present

## 2017-04-07 DIAGNOSIS — Z79899 Other long term (current) drug therapy: Secondary | ICD-10-CM | POA: Insufficient documentation

## 2017-04-07 DIAGNOSIS — I10 Essential (primary) hypertension: Secondary | ICD-10-CM | POA: Diagnosis not present

## 2017-04-07 DIAGNOSIS — I351 Nonrheumatic aortic (valve) insufficiency: Secondary | ICD-10-CM | POA: Diagnosis not present

## 2017-04-07 DIAGNOSIS — C73 Malignant neoplasm of thyroid gland: Secondary | ICD-10-CM | POA: Diagnosis not present

## 2017-04-07 HISTORY — PX: NEPHROLITHOTOMY: SHX5134

## 2017-04-07 LAB — BASIC METABOLIC PANEL
ANION GAP: 8 (ref 5–15)
BUN: 12 mg/dL (ref 6–20)
CALCIUM: 7.8 mg/dL — AB (ref 8.9–10.3)
CO2: 26 mmol/L (ref 22–32)
CREATININE: 0.91 mg/dL (ref 0.44–1.00)
Chloride: 105 mmol/L (ref 101–111)
GFR calc Af Amer: >60 mL/min (ref >60)
GFR calc non Af Amer: >60 mL/min (ref >60)
GLUCOSE: 160 mg/dL — AB (ref 65–99)
Potassium: 3.9 mmol/L (ref 3.5–5.1)
Sodium: 139 mmol/L (ref 135–145)

## 2017-04-07 LAB — TYPE AND SCREEN
ABO/RH(D): A POS
Antibody Screen: NEGATIVE

## 2017-04-07 LAB — CBC
HCT: 34.1 % — ABNORMAL LOW (ref 36.0–46.0)
HEMOGLOBIN: 11 g/dL — AB (ref 12.0–15.0)
MCH: 28.6 pg (ref 26.0–34.0)
MCHC: 32.3 g/dL (ref 30.0–36.0)
MCV: 88.8 fL (ref 78.0–100.0)
PLATELETS: 219 10*3/uL (ref 150–400)
RBC: 3.84 MIL/uL — ABNORMAL LOW (ref 3.87–5.11)
RDW: 14 % (ref 11.5–15.5)
WBC: 9 10*3/uL (ref 4.0–10.5)

## 2017-04-07 SURGERY — NEPHROLITHOTOMY PERCUTANEOUS
Anesthesia: General | Laterality: Left

## 2017-04-07 MED ORDER — ATENOLOL 50 MG PO TABS
50.0000 mg | ORAL_TABLET | Freq: Every day | ORAL | Status: DC
  Administered 2017-04-08 – 2017-04-09 (×2): 50 mg via ORAL
  Filled 2017-04-07 (×2): qty 1

## 2017-04-07 MED ORDER — SUGAMMADEX SODIUM 200 MG/2ML IV SOLN
INTRAVENOUS | Status: DC | PRN
  Administered 2017-04-07: 200 mg via INTRAVENOUS

## 2017-04-07 MED ORDER — ROCURONIUM BROMIDE 50 MG/5ML IV SOSY
PREFILLED_SYRINGE | INTRAVENOUS | Status: AC
Start: 2017-04-07 — End: 2017-04-07
  Filled 2017-04-07: qty 5

## 2017-04-07 MED ORDER — BUPIVACAINE HCL (PF) 0.5 % IJ SOLN
INTRAMUSCULAR | Status: AC
  Filled 2017-04-07: qty 30

## 2017-04-07 MED ORDER — SODIUM CHLORIDE 0.9 % IR SOLN
Status: DC | PRN
  Administered 2017-04-07: 33000 mL

## 2017-04-07 MED ORDER — SUCCINYLCHOLINE CHLORIDE 200 MG/10ML IV SOSY
PREFILLED_SYRINGE | INTRAVENOUS | Status: AC
  Filled 2017-04-07: qty 10

## 2017-04-07 MED ORDER — PHENAZOPYRIDINE HCL 200 MG PO TABS
200.0000 mg | ORAL_TABLET | Freq: Three times a day (TID) | ORAL | Status: DC
  Administered 2017-04-08 – 2017-04-09 (×4): 200 mg via ORAL
  Filled 2017-04-07 (×5): qty 1

## 2017-04-07 MED ORDER — ROCURONIUM BROMIDE 50 MG/5ML IV SOSY
PREFILLED_SYRINGE | INTRAVENOUS | Status: AC
  Filled 2017-04-07: qty 5

## 2017-04-07 MED ORDER — ONDANSETRON HCL 4 MG/2ML IJ SOLN: 4.0000 mg | Freq: Four times a day (QID) | INTRAMUSCULAR | Status: DC | PRN

## 2017-04-07 MED ORDER — TRAMADOL HCL 50 MG PO TABS: 50.0000 mg | ORAL_TABLET | Freq: Four times a day (QID) | ORAL | Status: DC | PRN

## 2017-04-07 MED ORDER — DOCUSATE SODIUM 100 MG PO CAPS
100.0000 mg | ORAL_CAPSULE | Freq: Two times a day (BID) | ORAL | Status: DC
  Administered 2017-04-07 – 2017-04-09 (×4): 100 mg via ORAL
  Filled 2017-04-07 (×4): qty 1

## 2017-04-07 MED ORDER — BUPIVACAINE-EPINEPHRINE (PF) 0.5% -1:200000 IJ SOLN
INTRAMUSCULAR | Status: AC
  Filled 2017-04-07: qty 30

## 2017-04-07 MED ORDER — BELLADONNA ALKALOIDS-OPIUM 16.2-60 MG RE SUPP: 1.0000 | Freq: Three times a day (TID) | RECTAL | Status: DC | PRN

## 2017-04-07 MED ORDER — GENTAMICIN SULFATE 40 MG/ML IJ SOLN
5.0000 mg/kg | INTRAVENOUS | Status: AC
  Administered 2017-04-07: 390 mg via INTRAVENOUS
  Filled 2017-04-07: qty 9.75

## 2017-04-07 MED ORDER — PROPOFOL 10 MG/ML IV BOLUS
INTRAVENOUS | Status: AC
  Filled 2017-04-07: qty 20

## 2017-04-07 MED ORDER — FENTANYL CITRATE (PF) 100 MCG/2ML IJ SOLN
INTRAMUSCULAR | Status: AC
Start: 2017-04-07 — End: 2017-04-08
  Filled 2017-04-07: qty 2

## 2017-04-07 MED ORDER — GABAPENTIN 100 MG PO CAPS
200.0000 mg | ORAL_CAPSULE | Freq: Every day | ORAL | Status: DC
  Administered 2017-04-07 – 2017-04-08 (×2): 200 mg via ORAL
  Filled 2017-04-07 (×2): qty 2

## 2017-04-07 MED ORDER — ROCURONIUM BROMIDE 50 MG/5ML IV SOSY
PREFILLED_SYRINGE | INTRAVENOUS | Status: DC | PRN
  Administered 2017-04-07 (×2): 10 mg via INTRAVENOUS
  Administered 2017-04-07: 40 mg via INTRAVENOUS

## 2017-04-07 MED ORDER — MAGNESIUM CITRATE PO SOLN
1.0000 | Freq: Once | ORAL | Status: DC
  Filled 2017-04-07: qty 296

## 2017-04-07 MED ORDER — ONDANSETRON HCL 4 MG/2ML IJ SOLN
INTRAMUSCULAR | Status: DC | PRN
  Administered 2017-04-07: 4 mg via INTRAVENOUS

## 2017-04-07 MED ORDER — FAMOTIDINE 20 MG PO TABS
20.0000 mg | ORAL_TABLET | Freq: Two times a day (BID) | ORAL | Status: DC
  Administered 2017-04-07 – 2017-04-09 (×4): 20 mg via ORAL
  Filled 2017-04-07 (×4): qty 1

## 2017-04-07 MED ORDER — DEXAMETHASONE SODIUM PHOSPHATE 10 MG/ML IJ SOLN
INTRAMUSCULAR | Status: DC | PRN
  Administered 2017-04-07: 10 mg via INTRAVENOUS

## 2017-04-07 MED ORDER — FENTANYL CITRATE (PF) 100 MCG/2ML IJ SOLN
25.0000 ug | INTRAMUSCULAR | Status: DC | PRN
  Administered 2017-04-07 (×2): 25 ug via INTRAVENOUS

## 2017-04-07 MED ORDER — IOHEXOL 300 MG/ML  SOLN
INTRAMUSCULAR | Status: DC | PRN
  Administered 2017-04-07: 60 mL

## 2017-04-07 MED ORDER — ONDANSETRON HCL 4 MG/2ML IJ SOLN
INTRAMUSCULAR | Status: AC
  Filled 2017-04-07: qty 2

## 2017-04-07 MED ORDER — LACTATED RINGERS IV SOLN
INTRAVENOUS | Status: DC
  Administered 2017-04-07 (×3): via INTRAVENOUS

## 2017-04-07 MED ORDER — FENTANYL CITRATE (PF) 250 MCG/5ML IJ SOLN
INTRAMUSCULAR | Status: AC
  Filled 2017-04-07: qty 5

## 2017-04-07 MED ORDER — FENTANYL CITRATE (PF) 100 MCG/2ML IJ SOLN
INTRAMUSCULAR | Status: DC | PRN
  Administered 2017-04-07 (×5): 50 ug via INTRAVENOUS

## 2017-04-07 MED ORDER — CEFAZOLIN SODIUM-DEXTROSE 2-4 GM/100ML-% IV SOLN
2.0000 g | INTRAVENOUS | Status: AC
  Administered 2017-04-07: 2 g via INTRAVENOUS
  Filled 2017-04-07 (×3): qty 100

## 2017-04-07 MED ORDER — EPHEDRINE 5 MG/ML INJ
INTRAVENOUS | Status: AC
  Filled 2017-04-07: qty 10

## 2017-04-07 MED ORDER — BUPIVACAINE HCL 0.5 % IJ SOLN
INTRAMUSCULAR | Status: DC | PRN
  Administered 2017-04-07: 30 mL

## 2017-04-07 MED ORDER — ZOLPIDEM TARTRATE 5 MG PO TABS
5.0000 mg | ORAL_TABLET | Freq: Every day | ORAL | Status: DC
  Administered 2017-04-07 – 2017-04-08 (×2): 5 mg via ORAL
  Filled 2017-04-07 (×2): qty 1

## 2017-04-07 MED ORDER — SUGAMMADEX SODIUM 200 MG/2ML IV SOLN
INTRAVENOUS | Status: AC
  Filled 2017-04-07: qty 2

## 2017-04-07 MED ORDER — LIDOCAINE 2% (20 MG/ML) 5 ML SYRINGE
INTRAMUSCULAR | Status: DC | PRN
  Administered 2017-04-07: 100 mg via INTRAVENOUS

## 2017-04-07 MED ORDER — LIDOCAINE 2% (20 MG/ML) 5 ML SYRINGE
INTRAMUSCULAR | Status: AC
  Filled 2017-04-07: qty 5

## 2017-04-07 MED ORDER — HYDRALAZINE HCL 20 MG/ML IJ SOLN: 5.0000 mg | INTRAMUSCULAR | Status: DC | PRN

## 2017-04-07 MED ORDER — LATANOPROST 0.005 % OP SOLN
1.0000 [drp] | Freq: Every day | OPHTHALMIC | Status: DC
  Administered 2017-04-07 – 2017-04-08 (×2): 1 [drp] via OPHTHALMIC
  Filled 2017-04-07: qty 2.5

## 2017-04-07 MED ORDER — TRAMADOL HCL 50 MG PO TABS: 50.0000 mg | ORAL_TABLET | Freq: Four times a day (QID) | ORAL | 0 refills | Status: DC | PRN

## 2017-04-07 MED ORDER — SUCCINYLCHOLINE CHLORIDE 200 MG/10ML IV SOSY
PREFILLED_SYRINGE | INTRAVENOUS | Status: DC | PRN
  Administered 2017-04-07: 100 mg via INTRAVENOUS

## 2017-04-07 MED ORDER — DEXAMETHASONE SODIUM PHOSPHATE 10 MG/ML IJ SOLN
INTRAMUSCULAR | Status: AC
  Filled 2017-04-07: qty 1

## 2017-04-07 MED ORDER — ONDANSETRON HCL 4 MG PO TABS: 4.0000 mg | ORAL_TABLET | Freq: Three times a day (TID) | ORAL | Status: DC | PRN

## 2017-04-07 MED ORDER — ONDANSETRON HCL 4 MG/2ML IJ SOLN: 4.0000 mg | Freq: Once | INTRAMUSCULAR | Status: DC | PRN

## 2017-04-07 MED ORDER — PROPOFOL 10 MG/ML IV BOLUS
INTRAVENOUS | Status: DC | PRN
  Administered 2017-04-07: 150 mg via INTRAVENOUS

## 2017-04-07 MED ORDER — EPHEDRINE SULFATE 50 MG/ML IJ SOLN
INTRAMUSCULAR | Status: DC | PRN
  Administered 2017-04-07: 10 mg via INTRAVENOUS
  Administered 2017-04-07 (×2): 5 mg via INTRAVENOUS
  Administered 2017-04-07: 10 mg via INTRAVENOUS

## 2017-04-07 MED ORDER — 0.9 % SODIUM CHLORIDE (POUR BTL) OPTIME
TOPICAL | Status: DC | PRN
  Administered 2017-04-07: 1000 mL

## 2017-04-07 MED ORDER — SODIUM CHLORIDE 0.45 % IV SOLN
INTRAVENOUS | Status: DC
  Administered 2017-04-07 – 2017-04-08 (×2): via INTRAVENOUS

## 2017-04-07 MED ORDER — ACETAMINOPHEN 10 MG/ML IV SOLN
1000.0000 mg | Freq: Four times a day (QID) | INTRAVENOUS | Status: AC
  Administered 2017-04-07 – 2017-04-08 (×4): 1000 mg via INTRAVENOUS
  Filled 2017-04-07 (×4): qty 100

## 2017-04-07 MED ORDER — BISACODYL 10 MG RE SUPP
10.0000 mg | Freq: Every day | RECTAL | Status: DC | PRN
  Administered 2017-04-07: 10 mg via RECTAL
  Filled 2017-04-07: qty 1

## 2017-04-07 SURGICAL SUPPLY — 67 items
APL SKNCLS STERI-STRIP NONHPOA (GAUZE/BANDAGES/DRESSINGS) ×1
BAG URINE DRAINAGE (UROLOGICAL SUPPLIES) ×4 IMPLANT
BASKET STNLS GEMINI 4WIRE 3FR (BASKET) IMPLANT
BASKET STONE NCOMPASS (UROLOGICAL SUPPLIES) IMPLANT
BASKET ZERO TIP NITINOL 2.4FR (BASKET) ×1 IMPLANT
BENZOIN TINCTURE PRP APPL 2/3 (GAUZE/BANDAGES/DRESSINGS) ×3 IMPLANT
BLADE SURG 15 STRL LF DISP TIS (BLADE) ×1 IMPLANT
BLADE SURG 15 STRL SS (BLADE) ×2
BSKT STON RTRVL GEM 120X11 3FR (BASKET)
BSKT STON RTRVL ZERO TP 2.4FR (BASKET) ×1
CATCHER STONE W/TUBE ADAPTER (UROLOGICAL SUPPLIES) IMPLANT
CATH AINSWORTH 30CC 24FR (CATHETERS) IMPLANT
CATH FOLEY 2WAY SLVR  5CC 16FR (CATHETERS)
CATH FOLEY 2WAY SLVR 5CC 16FR (CATHETERS) ×1 IMPLANT
CATH FOLEY LATEX FREE 16FR (CATHETERS) ×1 IMPLANT
CATH FOLEY LATEX FREE 22FR (CATHETERS) ×2
CATH FOLEY LF 22FR (CATHETERS) IMPLANT
CATH IMAGER II 65CM (CATHETERS) ×2 IMPLANT
CATH URET 5FR 28IN OPEN ENDED (CATHETERS) ×2 IMPLANT
CATH URET DUAL LUMEN 6-10FR 50 (CATHETERS) ×2 IMPLANT
CATH X-FORCE N30 NEPHROSTOMY (TUBING) ×2 IMPLANT
CHLORAPREP W/TINT 26ML (MISCELLANEOUS) ×2 IMPLANT
CONT SPEC 4OZ CLIKSEAL STRL BL (MISCELLANEOUS) ×1 IMPLANT
COVER SURGICAL LIGHT HANDLE (MISCELLANEOUS) ×1 IMPLANT
DRAPE C-ARM 42X120 X-RAY (DRAPES) ×2 IMPLANT
DRAPE INCISE IOBAN 66X45 STRL (DRAPES) ×1 IMPLANT
DRAPE LINGEMAN PERC (DRAPES) ×2 IMPLANT
DRAPE SURG IRRIG POUCH 19X23 (DRAPES) ×1 IMPLANT
DRSG PAD ABDOMINAL 8X10 ST (GAUZE/BANDAGES/DRESSINGS) ×2 IMPLANT
DRSG TEGADERM 4X4.75 (GAUZE/BANDAGES/DRESSINGS) ×1 IMPLANT
DRSG TEGADERM 8X12 (GAUZE/BANDAGES/DRESSINGS) ×2 IMPLANT
FIBER LASER FLEXIVA 1000 (UROLOGICAL SUPPLIES) IMPLANT
FIBER LASER FLEXIVA 365 (UROLOGICAL SUPPLIES) IMPLANT
FIBER LASER FLEXIVA 550 (UROLOGICAL SUPPLIES) IMPLANT
FIBER LASER TRAC TIP (UROLOGICAL SUPPLIES) IMPLANT
GAUZE SPONGE 4X4 12PLY STRL (GAUZE/BANDAGES/DRESSINGS) ×1 IMPLANT
GLOVE BIOGEL M STRL SZ7.5 (GLOVE) ×1 IMPLANT
GLOVE BIOGEL PI IND STRL 7.5 (GLOVE) IMPLANT
GLOVE BIOGEL PI INDICATOR 7.5 (GLOVE) ×1
GOWN STRL REUS W/TWL XL LVL3 (GOWN DISPOSABLE) ×2 IMPLANT
GUIDEWIRE AMPLAZ .035X145 (WIRE) ×2 IMPLANT
GUIDEWIRE ANG ZIPWIRE 038X150 (WIRE) IMPLANT
GUIDEWIRE STR DUAL SENSOR (WIRE) ×2 IMPLANT
IV SET EXTENSION CATH 6 NF (IV SETS) ×2 IMPLANT
KIT BASIN OR (CUSTOM PROCEDURE TRAY) ×2 IMPLANT
MANIFOLD NEPTUNE II (INSTRUMENTS) ×2 IMPLANT
NDL SPNL 20GX3.5 QUINCKE YW (NEEDLE) IMPLANT
NDL TROCAR 18X15 ECHO (NEEDLE) IMPLANT
NDL TROCAR 18X20 (NEEDLE) IMPLANT
NEEDLE SPNL 20GX3.5 QUINCKE YW (NEEDLE) ×2 IMPLANT
NEEDLE TROCAR 18X15 ECHO (NEEDLE) ×2 IMPLANT
NEEDLE TROCAR 18X20 (NEEDLE) ×2 IMPLANT
NS IRRIG 1000ML POUR BTL (IV SOLUTION) ×2 IMPLANT
PACK CYSTO (CUSTOM PROCEDURE TRAY) ×2 IMPLANT
PROBE LITHOCLAST ULTRA 3.8X403 (UROLOGICAL SUPPLIES) ×1 IMPLANT
PROBE PNEUMATIC 1.0MMX570MM (UROLOGICAL SUPPLIES) ×1 IMPLANT
SHEATH PEELAWAY SET 9 (SHEATH) ×2 IMPLANT
SPONGE LAP 4X18 X RAY DECT (DISPOSABLE) ×2 IMPLANT
STENT URET 6FRX24 CONTOUR (STENTS) ×1 IMPLANT
STONE CATCHER W/TUBE ADAPTER (UROLOGICAL SUPPLIES) ×2 IMPLANT
SUT ETHILON 2 0 PS N (SUTURE) ×2 IMPLANT
SYR 10ML LL (SYRINGE) ×2 IMPLANT
SYR 20CC LL (SYRINGE) ×4 IMPLANT
SYR 50ML LL SCALE MARK (SYRINGE) ×2 IMPLANT
SYRINGE IRR TOOMEY STRL 70CC (SYRINGE) IMPLANT
TOWEL OR 17X26 10 PK STRL BLUE (TOWEL DISPOSABLE) ×2 IMPLANT
TUBING CONNECTING 10 (TUBING) ×4 IMPLANT

## 2017-04-07 NOTE — Op Note (Signed)
Pre-operative diagnosis: left renal pelvis stones, 2.0 cm Post-operative diagnosis: as above   Procedure performed: cystoscopy, left retrograde pyelogram with interpretation, left percutaneous renal access, left nephrolithotomy,     Surgeon: Dr. Crist Fat  Assistant: none  Anesthesia: General  Complications: None  Specimens: The majority of the stones were removed and will be sent to the [facility] for further analysis.  Findings: 1. Lower pole access was obtained 2. 24 times 6 cm double-J ureteral stent placed under fluoroscopic guidance the left ureter   EBL: Approximately 100 cc  Specimens: stone from collecting system - taken to [facility]  Indication: [name] is a 76 y.o. patient with a history of nephrolithiasis with a large stone burden in the left kidney. After reviewing the management options for treatment, he elected to proceed with the above surgical procedure(s). We have discussed the potential benefits and risks of the procedure, side effects of the proposed treatment, the likelihood of the patient achieving the goals of the procedure, and any potential problems that might occur during the procedure or recuperation. Informed consent has been obtained.   Description:  Consent was obtained in the preoperative holding area. The patient was marked appropriately and then taken back to the operating room where he was intubated on the gurney. The patient was flipped prone onto the split leg OR table. Large jelly rolls were placed in the anterior axillary line on both sides allowing the patient's chest and abdomen to fall inbetween. The patient was then prepped and draped in the routine sterile fashion in the left flank and genital area. A timeout was then held confirming the proper side and procedure as well as antibiotics were administered.  I then used the flexible cystoscope and passed gently into the patient's urethra under visual  guidance. Once into the bladder I grasped the stent emanating from the patient's left ureteral orifice and pull it to the urethral meatus. I then passed a wire through the stent and up into the left collecting system. I then removed the stent over the wire and exchanged for a 5 [place] open-ended ureteral Pollock catheter. The Pollack catheter was then advanced up to the UPJ. The wire was then removed and a retrograde pyelogram was performed with the above findings. I then turned my attention to the patient's left flank and obtaining percutaneous renal access.  I then used the flexible cystoscope and passed gently into the patient's urethra under visual guidance. Once into the bladder I grasped the stent emanating from the patient's left ureteral orifice and pull it to the urethral meatus. I then passed a wire through the stent and up into the left collecting system. I then removed the stent over the wire and exchanged for a 5 [place] open-ended ureteral Pollock catheter. The Pollack catheter was then advanced up to the UPJ. The wire was then removed and a retrograde pyelogram was performed with the above findings. I then turned my attention to the patient's left flank and obtaining percutaneous renal access.  Using the C-arm rotated at 25 and the bulls-eye technique with an 18-gauge coaxial needle the lower posterior lateral calyx was targeted. Then rotating the C-arm AP depth of our needle was noted to be within the calyx and the inner part of the coaxial needle was removed. Urine was noted to return. A 0.038 sensor wire was then passed through the sheath of the coaxial needle and into the left renal collecting system. The wire was then passed  down the ureter and into the bladder using fluoroscopic guide and the sheath of the needle was removed.  An angiographic catheter was then advanced into the bladder and the wire removed.  A Super Stiff wire was then passed into the angiographic catheter and the  angiographic catheter removed. A 9 French peel-away dilator was then advanced over the Super Stiff wire and passed into the renal pelvis and across the UPJ under fluoroscopic guidance.  The inner part of this dilator was removed and the 0.38 sensor wire was passed alongside the Super Stiff wire through the dilator and into the left ureter and down into the bladder. The angiographic catheter again was passed over the guidewire and advanced into the bladder, the wire was then removed. A Super Stiff wire was then passed through angiographic catheter and angiographic catheter removed. The outer part of the sheath was then removed, establishing 2 superstiff wires through the targeted calyx and into the bladder.   The 76 French NephroMax balloon was then passed over one of the Super Stiff wires and the tip guided down into the targeted calyx. The balloon was then inflated to approximately 12 atm, and once there was no waist noted under fluoroscopy the access sheath was advanced over the balloon. The balloon was then removed. The wires were then placed back into the sheaths and snapped to the drape.   Using the rigid nephroscope to explore the targeted calyx and kidney.  The stone was encountered in the renal pelvis and using the lithoclast was fragmented into numerous parts and then removed the 2 prong grasper. I then used the  cystoscope to navigate the remaining calyces of the kidney multiple smaller stone fragments encountered.  Using the 0 tip basket these fragments were grabbed and removed. Contrast was injected through the cystoscope and the calyces systematically inspected under fluoroscopic guidance to ensure that all stone fragments had been removed.   Then using the flexible ureteroscope the ureter was navigated in antegrade fashion. All stone fragments were pushed from the ureter into the bladder. Once the ureter was clear the scope was advanced into the bladder and a 0.038 sensor wire was left in the  bladder and the scope backed out over wire. The sensor wire was then backloaded over the rigid nephroscope using the stent pusher and a  24 cm x 6 French double-J ureteral stent was passed antegrade over the sensor wire down into the bladder under fluoroscopic guidance. Once the stent was in the bladder the wire was gently pulled back and a nice curl noted in the bladder. The wire completely removed from the stent, and nice curl on the proximal end of the stent was noted in the renal pelvis. The sheath was then backed out slowly to ensure that all calyces had been inspected and there was nothing behind the sheath.   A 46F ainsworth tip catheter was then passed over one of the Super Stiff wires through the sheath and into the renal pelvis. The sheath was then backed out of the kidney and cut over the red rubber catheter. A nephrostogram was then performed confirming the position of our nephrostomy tube and reassuring that there were no longer any filling defects from the patient's symptoms.  After several minutes of direct pressure and observation was noted that there was no significant bleeding from the nephrostomy tube or around the nephrostomy tube tract. As such, I remove the nephrostomy tube as well as the safety wire. 25 cc of local anesthesia was  then injected into the patient's wound, and the wound was closed with 3-0 nylon in 2 vertical mattress sutures. The incision was then padded using a bundle of 4 x 4's and Hypafix tape. Patient was subsequently rolled over to the supine position and extubated. The patient was returned to the PACU in excellent condition. At the end of the case all lap and needle and sponges were accounted for. There are no perioperative complications.

## 2017-04-07 NOTE — Anesthesia Postprocedure Evaluation (Signed)
Anesthesia Post Note  Patient: [name]  Procedure(s) Performed: NEPHROLITHOTOMY PERCUTANEOUS WITH SURGEON ACCESS (Left )     Patient location during evaluation: PACU Anesthesia Type: General Level of consciousness: awake and alert Pain management: pain level controlled Vital Signs Assessment: post-procedure vital signs reviewed and stable Respiratory status: spontaneous breathing, nonlabored ventilation, respiratory function stable and patient connected to nasal cannula oxygen Cardiovascular status: blood pressure returned to baseline and stable Postop Assessment: no apparent nausea or vomiting Anesthetic complications: no    Last Vitals:  Vitals:   04/07/17 1400 04/07/17 1415  BP: (!) 141/85 (!) 158/56  Pulse: 62 60  Resp: 14 12  Temp:    SpO2: 96% 97%    Last Pain:  Vitals:   04/07/17 1415  TempSrc:   PainSc: [place]

## 2017-04-07 NOTE — Plan of Care (Signed)
Problem: Activity: Goal: Risk for activity intolerance will decrease Outcome: Progressing Up to bathroom with FWW  Problem: Fluid Volume: Goal: Ability to maintain a balanced intake and output will improve Outcome: Progressing .

## 2017-04-07 NOTE — Anesthesia Preprocedure Evaluation (Addendum)
Anesthesia Evaluation  Patient identified by MRN, date of birth, ID band Patient awake    Reviewed: Allergy & Precautions, H&P , NPO status , Patient's Chart, lab work & pertinent test results, reviewed documented beta blocker date and time   Airway Mallampati: I  TM Distance: >3 FB Neck ROM: Full    Dental  (+) Missing, Dental Advisory Given, Partial Upper   Pulmonary neg COPD,    breath sounds clear to auscultation       Cardiovascular hypertension, Pt. on medications and Pt. on home beta blockers + Peripheral Vascular Disease   Rhythm:Regular Rate:Normal  TTE 2017 - Mild LVH. Ejection fraction was in the range of 55% to 60%. Grade 1 diastolic dysfunction. Mild AI. The ascending aorta was moderately dilated. Moderate left atrial dilatation.  9/11 stress test- no ischemia, EF 72%  Ascending aortic dilation at 4.7cm on 03/2015   Neuro/Psych Anxiety Depression  Neuromuscular disease negative psych ROS   GI/Hepatic Neg liver ROS, hiatal hernia, GERD  Controlled and Medicated,  Endo/Other  negative endocrine ROS  Renal/GU negative Renal ROS  negative genitourinary   Musculoskeletal  (+) Arthritis , Fibromyalgia - (celebrex)  Abdominal   Peds  Hematology negative hematology ROS (+)   Anesthesia Other Findings Glaucoma bilateral  Reproductive/Obstetrics                            Lab Results  Component Value Date   WBC 5.1 03/15/2017   HGB 11.8 03/15/2017   HCT 36.3 03/15/2017   MCV 87 03/15/2017   PLT 251 03/15/2017   Lab Results  Component Value Date   CREATININE 0.68 03/15/2017   BUN 12 03/15/2017   NA 143 03/15/2017   K 3.9 03/15/2017   CL 105 03/15/2017   CO2 25 03/15/2017    Anesthesia Physical  Anesthesia Plan  ASA: III  Anesthesia Plan: General   Post-op Pain Management:    Induction: Intravenous  PONV Risk Score and Plan: 4 or greater and Ondansetron, Treatment  may vary due to age or medical condition and Dexamethasone  Airway Management Planned: Oral ETT  Additional Equipment: None  Intra-op Plan:   Post-operative Plan: Extubation in OR  Informed Consent: I have reviewed the patients History and Physical, chart, labs and discussed the procedure including the risks, benefits and alternatives for the proposed anesthesia with the patient or authorized representative who has indicated his/her understanding and acceptance.   Dental advisory given  Plan Discussed with: CRNA  Anesthesia Plan Comments:         Anesthesia Quick Evaluation

## 2017-04-07 NOTE — Anesthesia Procedure Notes (Signed)
Procedure Name: Intubation Date/Time: 04/07/2017 9:41 AM Performed by: Maxwell Caul Pre-anesthesia Checklist: Patient identified, Emergency Drugs available, Suction available and Patient being monitored Patient Re-evaluated:Patient Re-evaluated prior to induction Oxygen Delivery Method: Circle system utilized Preoxygenation: Pre-oxygenation with 100% oxygen Induction Type: IV induction Ventilation: Mask ventilation without difficulty Laryngoscope Size: 4 and Mac Grade View: Grade I Tube type: Oral Number of attempts: 1 Airway Equipment and Method: Stylet Placement Confirmation: ETT inserted through vocal cords under direct vision,  positive ETCO2 and breath sounds checked- equal and bilateral Secured at: 21 cm Tube secured with: Tape Dental Injury: Teeth and Oropharynx as per pre-operative assessment

## 2017-04-07 NOTE — H&P (Signed)
f/u for obstructing stone  HPI: [name] is a 76 year-old female established patient who is here for further eval and management of an obstructing stone.  The patient was last seen 2 weeks prior.   The patient's stone is on her left side. The stone was 15 mm 10 mm left UPJ stone. There are no additional stones within the urinary tract.   The patient has not passed their stone since her visit. The patient is complaining of nausea and flank pain. The patient underwent CT scan prior to today's appointment.   the patient has been treated for enterococcus UTI. She denies any ongoing fevers or chills. She has not had any significant urinary tract symptoms. She presents today for discussion of treatment options for her UPJ stone.     ALLERGIES: Actonel alendronate - Nausea Latex Levofloxacin - Vomiting, Nausea Lipitor Lyrica Meloxicam - Itching, Vaginal Itching Sibutramine Hcl Monohydrate    MEDICATIONS: Aspirin  Levothyroxine Sodium  Simvastatin  Atenolol  Calcium Carbonate  Celecoxib  Clorazepate Dipotassium  Ergocalciferol  Fluticasone Propionate  Hydrocortisone  Multivitamin  Nystatin 100,000 unit/gram powder Apply to affected area 2-3 times per day PRN  Omega 3  Sulfamethoxazole-Trimethoprim  Travatan Z  Venlafaxine Hcl  Zolpidem Tartrate     GU PSH: None   NON-GU PSH: Appendectomy Hemorrhoidectomy Hernia Repair Knee replacement Remove Gallbladder Shoulder Surgery (Unspecified)    GU PMH: Renal calculus - 02/21/2017 Nocturia - 12/23/2016 Urinary Frequency - 12/23/2016 Urinary Tract Inf, Unspec site - 12/23/2016    NON-GU PMH: Candidiasis of skin and nail - 02/21/2017 Anxiety Arthritis Cardiac murmur, unspecified Depression Glaucoma Heartburn Hypercholesterolemia Hypertension Personal history of malignant neoplasm of thyroid    FAMILY HISTORY: Arthritis - Father Heart Attack - Father Kidney Stones - Runs in Family pancreatic cancer - Mother    SOCIAL HISTORY: Marital Status: Married Preferred Language: English; Ethnicity: Not Hispanic Or Latino; Race: White Current Smoking Status: Patient has never smoked.   Tobacco Use Assessment Completed: Used Tobacco in last 30 days? Has never drank.  Drinks 1 caffeinated drink per day. Patient's occupation is/was Retired.    REVIEW OF SYSTEMS:    GU Review Female:   Patient reports burning /pain with urination and get up at night to urinate. Patient denies frequent urination, hard to postpone urination, leakage of urine, stream starts and stops, trouble starting your stream, have to strain to urinate, and being pregnant.  Gastrointestinal (Upper):   Patient reports indigestion/ heartburn. Patient denies nausea and vomiting.  Gastrointestinal (Lower):   Patient reports constipation. Patient denies diarrhea.  Constitutional:   Patient denies fever, night sweats, weight loss, and fatigue.  Skin:   Patient denies skin rash/ lesion and itching.  Eyes:   Patient denies double vision and blurred vision.  Ears/ Nose/ Throat:   Patient denies sore throat and sinus problems.  Hematologic/Lymphatic:   Patient reports easy bruising. Patient denies swollen glands.  Cardiovascular:   Patient denies leg swelling and chest pains.  Respiratory:   Patient denies cough and shortness of breath.  Endocrine:   Patient denies excessive thirst.  Musculoskeletal:   Patient reports back pain and joint pain.   Neurological:   Patient denies headaches and dizziness.  Psychologic:   Patient reports anxiety. Patient denies depression.   VITAL SIGNS:      03/14/2017 04:19 PM  Weight 175 lb / 79.38 kg  BP 138/73 mmHg  Pulse 66 /min   MULTI-SYSTEM PHYSICAL EXAMINATION:    Constitutional: Well-nourished. No physical  deformities. Normally developed. Good grooming.  Neck: Neck symmetrical, not swollen. Normal tracheal position.  Respiratory: No labored breathing, no use of accessory muscles. clear to auscultation  bilaterally  Cardiovascular: Normal temperature, normal extremity pulses, no swelling, no varicosities. regular rate and rhythm  Lymphatic: No enlargement of neck, axillae, groin.  Skin: No paleness, no jaundice, no cyanosis. No lesion, no ulcer, no rash.  Neurologic / Psychiatric: Oriented to time, oriented to place, oriented to person. No depression, no anxiety, no agitation.  Gastrointestinal: No mass, no tenderness, no rigidity, non obese abdomen.  Eyes: Normal conjunctivae. Normal eyelids.  Ears, Nose, Mouth, and Throat: Left ear no scars, no lesions, no masses. Right ear no scars, no lesions, no masses. Nose no scars, no lesions, no masses. Normal hearing. Normal lips.  Musculoskeletal: Normal gait and station of head and neck.     PAST DATA REVIEWED:  Source Of History:  Patient  Records Review:   Previous Doctor Records, Previous Patient Records  X-Ray Review: C.T. Abdomen/Pelvis: Reviewed Films.     PROCEDURES:          Urinalysis w/Scope - 81001 Dipstick Dipstick Cont'd Micro  Color: Yellow Bilirubin: Neg WBC/hpf: >60/hpf  Appearance: Cloudy Ketones: Neg RBC/hpf: NS (Not Seen)  Specific Gravity: 1.015 Blood: 3+ Bacteria: Mod (26-50/hpf)  pH: 6.0 Protein: 3+ Cystals: NS (Not Seen)  Glucose: Neg Urobilinogen: 0.2 Casts: NS (Not Seen)    Nitrites: Neg Trichomonas: Not Present    Leukocyte Esterase: 2+ Mucous: Present      Epithelial Cells: 0 - 5/hpf      Yeast: NS (Not Seen)      Sperm: Not Present    Notes:      ASSESSMENT:      ICD-10 Details  1 GU:   Ureteral calculus - N20.1 Left   PLAN:           Orders Labs Urine Culture          Schedule Return Visit/Planned Activity: ASAP - Schedule Surgery          Document Letter(s):  Created for Patient: Clinical Summary    After going over the treatment options for their large stone burden I recommended that we proceed with percutaneous nephrolithotomy (PNCL). I then went over this surgery in significant  detail. I discussed the fact that the patient would be prone, and explained the renal access component. We then discussed the stone removal process as well as a postprocedure stent placement. I discussed the risks of this operation which predominantly include bleeding and damage to the surrounding structures. I also described for him the possibility of difficulty obtaining access which may require an additional surgery. I explained expected complications such as pain, bleeding and need for additional procedures. We also discussed the potential to require a blood transfusion. After going through surgery, potential complications, and expected outcome, the patient has agreed to proceed with the operation.        Notes:   after reviewing all the management options, the patient and I have opted to proceed with the left PCNL. This will give her the best option for managing this stone in one operation. She will need to be cleared by her primary care doctor, she will be seeing him next week. we'll try to get this scheduled shortly thereafter.

## 2017-04-07 NOTE — Discharge Instructions (Signed)
Discharge instructions following PCNL  Call your doctor for: Fevers greater than 100.5 Severe nausea or vomiting Increasing pain not controlled by pain medication Increasing redness or drainage from incisions Decreased urine output or a catheter is no longer draining  The number for questions is [phone].  Activity: Gradually increase activity with short frequent walks, 3-4 times a day.  Avoid strenuous activities, like sports, lawn-mowing, or heavy lifting (more than 10-15 pounds).  Wear loose, comfortable clothing that pull or kink the tube or tubes.  Do not drive while taking pain medication, or until your doctor permitts it.  Bathing and dressing changes: You should not shower for 48 hours after surgery.  Do not soak your back in a bathtub.  Diet: It is extremely important to drink plenty of fluids after surgery, especially water.  You may resume your regular diet, unless otherwise instructed.  Medications: May take Tylenol (acetaminophen) or ibuprofen (Advil, Motrin) as directed over-the-counter. Take any prescriptions as directed.  Follow-up appointments: Follow-up appointment will be scheduled with Dr. Brittish Bolinger in 10-14 days for hospital check and stent removal.  

## 2017-04-07 NOTE — Transfer of Care (Signed)
Immediate Anesthesia Transfer of Care Note  Patient: [name]  Procedure(s) Performed: Procedure(s): NEPHROLITHOTOMY PERCUTANEOUS WITH SURGEON ACCESS (Left)  Patient Location: PACU  Anesthesia Type:General  Level of Consciousness:  sedated, patient cooperative and responds to stimulation  Airway & Oxygen Therapy:Patient Spontanous Breathing and Patient connected to face mask oxgen  Post-op Assessment:  Report given to PACU RN and Post -op Vital signs reviewed and stable  Post vital signs:  Reviewed and stable  Last Vitals:  Vitals:   04/07/17 0636  BP: (!) 155/49  Pulse: 90  Resp: 16  Temp: 37.1 C  SpO2: 25%    Complications: No apparent anesthesia complications

## 2017-04-08 ENCOUNTER — Encounter (HOSPITAL_COMMUNITY): Payer: Self-pay | Admitting: Urology

## 2017-04-08 DIAGNOSIS — Z7982 Long term (current) use of aspirin: Secondary | ICD-10-CM | POA: Diagnosis not present

## 2017-04-08 DIAGNOSIS — Z79899 Other long term (current) drug therapy: Secondary | ICD-10-CM | POA: Diagnosis not present

## 2017-04-08 DIAGNOSIS — Z888 Allergy status to other drugs, medicaments and biological substances status: Secondary | ICD-10-CM | POA: Diagnosis not present

## 2017-04-08 DIAGNOSIS — N200 Calculus of kidney: Secondary | ICD-10-CM | POA: Diagnosis not present

## 2017-04-08 DIAGNOSIS — Z791 Long term (current) use of non-steroidal anti-inflammatories (NSAID): Secondary | ICD-10-CM | POA: Diagnosis not present

## 2017-04-08 LAB — BASIC METABOLIC PANEL
Anion gap: 8 (ref 5–15)
BUN: 14 mg/dL (ref 6–20)
CALCIUM: 8.1 mg/dL — AB (ref 8.9–10.3)
CO2: 26 mmol/L (ref 22–32)
CREATININE: 0.85 mg/dL (ref 0.44–1.00)
Chloride: 106 mmol/L (ref 101–111)
GFR calc non Af Amer: >60 mL/min (ref >60)
GLUCOSE: 129 mg/dL — AB (ref 65–99)
Potassium: 4.6 mmol/L (ref 3.5–5.1)
Sodium: 140 mmol/L (ref 135–145)

## 2017-04-08 LAB — CBC
HEMATOCRIT: 30.6 % — AB (ref 36.0–46.0)
HEMOGLOBIN: 10 g/dL — AB (ref 12.0–15.0)
MCH: 28.9 pg (ref 26.0–34.0)
MCHC: 32.7 g/dL (ref 30.0–36.0)
MCV: 88.4 fL (ref 78.0–100.0)
Platelets: 242 10*3/uL (ref 150–400)
RBC: 3.46 MIL/uL — ABNORMAL LOW (ref 3.87–5.11)
RDW: 14.1 % (ref 11.5–15.5)
WBC: 10.4 10*3/uL (ref 4.0–10.5)

## 2017-04-08 MED ORDER — SENNA 8.6 MG PO TABS: 1.0000 | ORAL_TABLET | Freq: Every day | ORAL | 0 refills | Status: AC

## 2017-04-08 MED ORDER — OXYCODONE HCL 5 MG PO CAPS: 5.0000 mg | ORAL_CAPSULE | ORAL | 0 refills | Status: DC | PRN

## 2017-04-08 MED ORDER — DOCUSATE SODIUM 100 MG PO CAPS: 200.0000 mg | ORAL_CAPSULE | Freq: Two times a day (BID) | ORAL | 11 refills | Status: DC

## 2017-04-08 MED ORDER — TAMSULOSIN HCL 0.4 MG PO CAPS: 0.4000 mg | ORAL_CAPSULE | Freq: Every day | ORAL | 0 refills | Status: AC

## 2017-04-08 NOTE — Progress Notes (Signed)
Patient ID: [name], female   DOB: 15-Aug-1940, 76 y.o.   MRN: [id number]  1 Day Post-Op Subjective: Pt doing well but unsteady on her feet.  Denies significant pain.  Expected stent symptoms.  Objective: Vital signs in last 24 hours: Temp:  [97.6 F (36.4 C)-98.4 F (36.9 C)] 98 F (36.7 C) (10/13 0522) Pulse Rate:  [60-82] 78 (10/13 0522) Resp:  [12-22] 20 (10/13 0522) BP: (131-164)/(56-85) 138/85 (10/13 0522) SpO2:  [91 %-100 %] 100 % (10/13 0522)  Intake/Output from previous day: 10/12 0701 - 10/13 0700 In: 4335.6 [I.V.:4025.8; IV Piggyback:309.8] Out: 2800 [Urine:2500; Blood:300] Intake/Output this shift: Total I/O In: 100 [IV Piggyback:100] Out: 800 [Urine:800]  Physical Exam:  General: Alert and oriented Abdomen: Soft, ND, No CVAT Incisions: PNL site clean with dressing intact Ext: NT, No erythema  Lab Results:  Recent Labs  04/07/17 1518 04/08/17 0524  HGB 11.0* 10.0*  HCT 34.1* 30.6*   BMET  Recent Labs  04/07/17 1518 04/08/17 0524  NA 139 140  K 3.9 4.6  CL 105 106  CO2 26 26  GLUCOSE 160* 129*  BUN 12 14  CREATININE 0.91 0.85  CALCIUM 7.8* 8.1*     Studies/Results: Dg C-arm Gt 120 Min-no Report  Result Date: 04/07/2017 Fluoroscopy was utilized by the requesting physician.  No radiographic interpretation.    Assessment/Plan: POD # 1 s/p left tubeless PCNL - Doing well but not stable walking - Will reassess later today and will get PT consult to assess   LOS: 0 days   [name],LES 04/08/2017, 8:02 AM

## 2017-04-09 DIAGNOSIS — N200 Calculus of kidney: Secondary | ICD-10-CM | POA: Diagnosis not present

## 2017-04-09 NOTE — Discharge Summary (Signed)
[facility] Discharge Summary  Admit date: 04/07/2017  Discharge date and time: 04/09/17   Discharge to: Home  Discharge Service: Urology  Discharge Attending Physician:  Dr. Raynelle Bring  Discharge  Diagnoses: Left nephrolithiasis  Secondary Diagnosis: Active Problems:   Nephrolithiasis   OR Procedures: Procedure(s): NEPHROLITHOTOMY PERCUTANEOUS WITH SURGEON ACCESS 04/07/2017   Ancillary Procedures: None   Discharge Day Services: The patient was seen and examined by the Urology team both in the morning and immediately prior to discharge.  Vital signs and laboratory values were stable and within normal limits.  The physical exam was benign and unchanged and all surgical wounds were examined.  Discharge instructions were explained and all questions answered.  Subjective  No acute events overnight. Pain Controlled. No fever or chills.  Objective Patient Vitals for the past 8 hrs:  BP Temp Temp src Pulse Resp SpO2  04/09/17 0500 (!) 122/53 98.3 F (36.8 C) Oral 71 16 96 %   No intake/output data recorded.  General Appearance:        No acute distress Lungs:                       Normal work of breathing on room air Heart:                                Regular rate and rhythm Abdomen:                         Soft, non-tender, non-distended.  Back:        Left nephrostomy site clean, dressed with gauze.  Extremities:                      Warm and well perfused   Hospital Course:  The patient underwent L PCNL with left ureteral stent placement on 04/07/2017. Her nephrostomy tube was removed at the time of surgery. The patient tolerated the procedure well, was extubated in the OR, and afterwards was taken to the PACU for routine post-surgical care. When stable the patient was transferred to the floor.   The patient did well postoperatively.  The patient's diet was slowly advanced and at the time of discharge was tolerating a regular diet.  The patient was discharged home  2 Days Post-Op, at which point was tolerating a regular solid diet, was able to void spontaneously, have adequate pain control with P.O. pain medication, and could ambulate without difficulty. The patient will follow up with [place] in 1-2 weeks for stent removal.   Condition at Discharge: Improved  Discharge Medications:  Allergies as of 04/09/2017      Reactions   Alendronate Sodium Nausea Only   Levofloxacin Nausea And Vomiting   Lipitor [atorvastatin Calcium] Other (See Comments)   myalgias   Lyrica [pregabalin] Other (See Comments)   SORES IN MOUTH    Meloxicam    Vaginal itching    Sibutramine Hcl Monohydrate Other (See Comments)   Reaction unknown   Actonel [risedronate] Other (See Comments)   Bone pain and nausea   Latex Rash, Other (See Comments)   RA to latex 1982; includes bandaids       Medication List    TAKE these medications   acetaminophen 650 MG CR tablet Commonly known as:  TYLENOL Take 650 mg by mouth every 8 (eight) hours as needed for pain.   aspirin  81 MG tablet Take 1 tablet (81 mg total) by mouth every other day. What changed:  when to take this   atenolol 50 MG tablet Commonly known as:  TENORMIN TAKE 1 TABLET DAILY   calcium carbonate 1250 (500 Ca) MG tablet Commonly known as:  OS-CAL - dosed in mg of elemental calcium Take 2 tablets (1,000 mg of elemental calcium total) by mouth 3 (three) times daily with meals. What changed:  additional instructions   celecoxib 200 MG capsule Commonly known as:  CELEBREX TAKE (1) CAPSULE DAILY   clobetasol cream 0.05 % Commonly known as:  TEMOVATE Apply 1 application topically 2 (two) times daily. What changed:  when to take this  reasons to take this   clorazepate 7.5 MG tablet Commonly known as:  TRANXENE TAKE 1 TABLET DAILY AS NEEDED What changed:  See the new instructions.   CORRECTOL PO Take 2 tablets by mouth every other day.   docusate sodium 100 MG capsule Commonly known as:  COLACE Take  2 capsules (200 mg total) by mouth 2 (two) times daily.   famotidine 20 MG tablet Commonly known as:  PEPCID Take 1 tablet (20 mg total) by mouth 2 (two) times daily.   Fish Oil 600 MG Caps Take 600 mg by mouth daily.   fluticasone 50 MCG/ACT nasal spray Commonly known as:  FLONASE Place 2 sprays into the nose daily. What changed:  how much to take  when to take this  reasons to take this   gabapentin 100 MG capsule Commonly known as:  NEURONTIN Take 1-2 capsules QHS as directed   hydrocortisone 25 MG suppository Commonly known as:  ANUSOL-HC Place 1 suppository (25 mg total) rectally 2 (two) times daily as needed for hemorrhoids or anal itching.   levothyroxine 100 MCG tablet Commonly known as:  SYNTHROID, LEVOTHROID Take 100 mcg by mouth daily.   magnesium citrate Soln Take 1 Bottle by mouth once.   mupirocin ointment 2 % Commonly known as:  BACTROBAN Apply 1 application topically 2 (two) times daily. What changed:  when to take this  reasons to take this   nystatin powder Commonly known as:  MYCOSTATIN/NYSTOP Apply 1 g topically daily as needed (rash).   PROCTOZONE-HC 2.5 % rectal cream Generic drug:  hydrocortisone APPLY TO RECTALLY 2 TIMES A DAY AS NEEDED What changed:  See the new instructions.   ranitidine 150 MG tablet Commonly known as:  ZANTAC Take 150 mg by mouth 2 (two) times daily.   senna 8.6 MG Tabs tablet Commonly known as:  SENOKOT Take 1-2 tablets (8.6-17.2 mg total) by mouth at bedtime.   simvastatin 80 MG tablet Commonly known as:  ZOCOR TAKE ONE TABLET AT BEDTIME What changed:  See the new instructions.   sulfamethoxazole-trimethoprim 800-160 MG tablet Commonly known as:  BACTRIM DS,SEPTRA DS Take 1 tablet by mouth 2 (two) times daily.   tamsulosin 0.4 MG Caps capsule Commonly known as:  FLOMAX Take 1 capsule (0.4 mg total) by mouth at bedtime.   traMADol 50 MG tablet Commonly known as:  ULTRAM Take 1-2 tablets (50-100  mg total) by mouth every 6 (six) hours as needed for moderate pain.   TRAVATAN Z 0.004 % Soln ophthalmic solution Generic drug:  Travoprost (BAK Free) INSTILL 1 DROP INTO RIGHT EYE AT BEDTIME What changed:  See the new instructions.   triamcinolone cream 0.1 % Commonly known as:  KENALOG Apply 1 application topically 3 (three) times daily. Avoid face and genitalia  venlafaxine XR 75 MG 24 hr capsule Commonly known as:  EFFEXOR-XR TAKE (1) CAPSULE DAILY What changed:  how much to take  how to take this  when to take this  additional instructions   Vitamin D (Ergocalciferol) 50000 units Caps capsule Commonly known as:  DRISDOL Take 50,000 Units by mouth every 7 (seven) days. On Sundays   zolpidem 10 MG tablet Commonly known as:  AMBIEN TAKE 1/2 TABLET AT BEDTIME AS NEEDED What changed:  See the new instructions.

## 2017-04-09 NOTE — Progress Notes (Signed)
All d/c instructions given w/ verbal understanding.Awaiting ride.

## 2017-04-09 NOTE — Progress Notes (Signed)
D/c to home ambulating per request w/ husband.

## 2017-04-12 DIAGNOSIS — C73 Malignant neoplasm of thyroid gland: Secondary | ICD-10-CM | POA: Diagnosis not present

## 2017-04-12 DIAGNOSIS — E890 Postprocedural hypothyroidism: Secondary | ICD-10-CM | POA: Diagnosis not present

## 2017-04-14 ENCOUNTER — Other Ambulatory Visit: Payer: Self-pay | Admitting: *Deleted

## 2017-04-14 DIAGNOSIS — I712 Thoracic aortic aneurysm, without rupture, unspecified: Secondary | ICD-10-CM

## 2017-04-17 DIAGNOSIS — N201 Calculus of ureter: Secondary | ICD-10-CM | POA: Diagnosis not present

## 2017-05-02 DIAGNOSIS — N201 Calculus of ureter: Secondary | ICD-10-CM | POA: Diagnosis not present

## 2017-05-03 ENCOUNTER — Encounter: Payer: Self-pay | Admitting: Family Medicine

## 2017-05-03 ENCOUNTER — Ambulatory Visit: Payer: Medicare Other | Admitting: Family Medicine

## 2017-05-03 VITALS — BP 119/65 | HR 70 | Temp 97.9°F | Ht 64.0 in | Wt 167.0 lb

## 2017-05-03 DIAGNOSIS — G8929 Other chronic pain: Secondary | ICD-10-CM

## 2017-05-03 DIAGNOSIS — M25512 Pain in left shoulder: Secondary | ICD-10-CM | POA: Diagnosis not present

## 2017-05-03 DIAGNOSIS — N200 Calculus of kidney: Secondary | ICD-10-CM | POA: Diagnosis not present

## 2017-05-03 NOTE — Addendum Note (Signed)
Addended by: Zannie Cove on: 05/03/2017 11:52 AM   Modules accepted: Orders

## 2017-05-03 NOTE — Progress Notes (Signed)
Subjective:    Patient ID: [name], female    DOB: February 06, 1941, 76 y.o.   MRN: [id number]  HPI  Patient here today for 4 week follow up on left lower leg wound.  Patient is doing well overall since her last visit here she has had kidney stone surgery.  Her vital signs are stable today.  The patient did well with the stone surgery and has had the stent removed and she is recovered well.  They did stretch her left arm and she is having some pain with the left arm and shoulder from the positioning of the left shoulder during the surgery.    Patient Active Problem List   Diagnosis Date Noted  . Nephrolithiasis 04/07/2017  . Vitamin B 12 deficiency 03/01/2016  . Ascending aortic aneurysm ([place]) 08/20/2015  . Thyroid cancer ([place]) 07/30/2015  . Left thyroid nodule 07/27/2015  . Osteoporosis with fracture 04/16/2014  . At high risk for falls 04/16/2014  . Proximal humerus fracture 05/30/2011  . CHEST PAIN, PRECORDIAL 02/26/2010  . Hyperlipidemia 02/23/2010  . Depression 02/23/2010  . GLAUCOMA 02/23/2010  . CATARACTS 02/23/2010  . RHEUMATIC FEVER 02/23/2010  . Essential hypertension 02/23/2010  . MITRAL VALVE PROLAPSE 02/23/2010  . GERD 02/23/2010  . Osteoarthritis 02/23/2010  . Primary fibromyalgia syndrome 02/23/2010   Outpatient Encounter Medications as of 05/03/2017  Medication Sig  . acetaminophen (TYLENOL) 650 MG CR tablet Take 650 mg by mouth every 8 (eight) hours as needed for pain.  Marland Kitchen aspirin 81 MG tablet Take 1 tablet (81 mg total) by mouth every other day. (Patient taking differently: Take 81 mg by mouth at bedtime. )  . atenolol (TENORMIN) 50 MG tablet TAKE 1 TABLET DAILY  . Bisacodyl (CORRECTOL PO) Take 2 tablets by mouth every other day.  . calcium carbonate (OS-CAL - DOSED IN MG OF ELEMENTAL CALCIUM) 1250 (500 Ca) MG tablet Take 2 tablets (1,000 mg of elemental calcium total) by mouth 3 (three) times daily with meals. (Patient taking differently: Take 2 tablets by  mouth 3 (three) times daily with meals. 5 a day)  . celecoxib (CELEBREX) 200 MG capsule TAKE (1) CAPSULE DAILY  . clobetasol cream (TEMOVATE) 2.54 % Apply 1 application topically 2 (two) times daily. (Patient taking differently: Apply 1 application topically daily as needed (rash). )  . clorazepate (TRANXENE) 7.5 MG tablet TAKE 1 TABLET DAILY AS NEEDED (Patient taking differently: TAKE 1 TABLET DAILY AS NEEDED FOR ANXIETY)  . docusate sodium (COLACE) 100 MG capsule Take 2 capsules (200 mg total) by mouth 2 (two) times daily.  . famotidine (PEPCID) 20 MG tablet Take 1 tablet (20 mg total) by mouth 2 (two) times daily.  . fluticasone (FLONASE) 50 MCG/ACT nasal spray Place 2 sprays into the nose daily. (Patient taking differently: Place 1 spray into the nose daily as needed for allergies. )  . gabapentin (NEURONTIN) 100 MG capsule Take 1-2 capsules QHS as directed  . hydrocortisone (ANUSOL-HC) 25 MG suppository Place 1 suppository (25 mg total) rectally 2 (two) times daily as needed for hemorrhoids or anal itching.  . levothyroxine (SYNTHROID, LEVOTHROID) 112 MCG tablet Take 1 tablet daily by mouth.  . magnesium citrate SOLN Take 1 Bottle by mouth once.  . mupirocin ointment (BACTROBAN) 2 % Apply 1 application topically 2 (two) times daily. (Patient taking differently: Apply 1 application topically 2 (two) times daily as needed (rash). )  . nystatin (MYCOSTATIN/NYSTOP) powder Apply 1 g topically daily as needed (rash).   Marland Kitchen  Omega-3 Fatty Acids (FISH OIL) 600 MG CAPS Take 600 mg by mouth daily.  Marland Kitchen PROCTOZONE-HC 2.5 % rectal cream APPLY TO RECTALLY 2 TIMES A DAY AS NEEDED (Patient taking differently: APPLY TO RECTALLY 1 TIME A DAY)  . ranitidine (ZANTAC) 150 MG tablet Take 150 mg by mouth 2 (two) times daily.  Marland Kitchen senna (SENOKOT) 8.6 MG TABS tablet Take 1-2 tablets (8.6-17.2 mg total) by mouth at bedtime.  . simvastatin (ZOCOR) 80 MG tablet TAKE ONE TABLET AT BEDTIME (Patient taking differently: TAKE ONE  HALF TABLET AT BEDTIME)  . tamsulosin (FLOMAX) 0.4 MG CAPS capsule Take 1 capsule (0.4 mg total) by mouth at bedtime.  . traMADol (ULTRAM) 50 MG tablet Take 1-2 tablets (50-100 mg total) by mouth every 6 (six) hours as needed for moderate pain.  . TRAVATAN Z 0.004 % SOLN ophthalmic solution INSTILL 1 DROP INTO RIGHT EYE AT BEDTIME (Patient taking differently: INSTILL 1 DROP INTO BOTH EYES AT BEDTIME)  . triamcinolone cream (KENALOG) 0.1 % Apply 1 application topically 3 (three) times daily. Avoid face and genitalia  . venlafaxine XR (EFFEXOR-XR) 75 MG 24 hr capsule TAKE (1) CAPSULE DAILY (Patient taking differently: Take 75 mg by mouth daily with breakfast. TAKE (1) CAPSULE DAILY)  . Vitamin D, Ergocalciferol, (DRISDOL) 50000 units CAPS capsule Take 50,000 Units by mouth every 7 (seven) days. On Sundays  . zolpidem (AMBIEN) 10 MG tablet TAKE 1/2 TABLET AT BEDTIME AS NEEDED (Patient taking differently: TAKE 1/2 TABLET AT BEDTIME)  . [DISCONTINUED] levothyroxine (SYNTHROID, LEVOTHROID) 100 MCG tablet Take 100 mcg by mouth daily.   . [DISCONTINUED] sulfamethoxazole-trimethoprim (BACTRIM DS,SEPTRA DS) 800-160 MG tablet Take 1 tablet by mouth 2 (two) times daily.   Facility-Administered Encounter Medications as of 05/03/2017  Medication  . cyanocobalamin ((VITAMIN B-12)) injection 1,000 mcg      Review of Systems  Constitutional: Negative.   HENT: Negative.   Eyes: Negative.   Respiratory: Negative.   Cardiovascular: Negative.   Gastrointestinal: Negative.   Endocrine: Negative.   Genitourinary: Negative.   Musculoskeletal: Negative.   Skin: Negative.   Allergic/Immunologic: Negative.   Neurological: Negative.   Hematological: Negative.   Psychiatric/Behavioral: Negative.        Objective:   Physical Exam  Constitutional: She is oriented to person, place, and time. She appears well-developed and well-nourished.  HENT:  Head: Normocephalic.  Eyes: Conjunctivae and EOM are normal.  Pupils are equal, round, and reactive to light. Right eye exhibits no discharge. Left eye exhibits no discharge. No scleral icterus.  Neck: Normal range of motion.  Musculoskeletal: She exhibits tenderness.  Limited range of motion of left shoulder with tenderness in the left anterior shoulder and the left suprascapular area.  Neurological: She is alert and oriented to person, place, and time.  Skin: Skin is warm and dry. No rash noted.  Left lower leg wound is completely healed  Psychiatric: She has a normal mood and affect. Her behavior is normal. Judgment and thought content normal.  Nursing note and vitals reviewed.  BP 119/65 (BP Location: Left Arm)   Pulse 70   Temp 97.9 F (36.6 C) (Oral)   Ht 5\' 4"  (1.626 m)   Wt 167 lb (75.8 kg)   BMI 28.67 kg/m         Assessment & Plan:  1. Chronic left shoulder pain -The patient has had previous surgery on the left shoulder and after the recent surgery to remove a kidney stone the pain has been exacerbated in  her left shoulder probably secondary to scar tissue. -We will arrange for physical therapy next door and the patient will call the orthopedic surgeon if the physical therapy does not seem to be improving her shoulder after 3-4 weeks for possible injection with orthopedic specialist.  2. Left nephrolithiasis -She has had a kidney stone removed from the left and has recuperated well from this other than the left shoulder pain that she has now.   Patient Instructions  We will arrange for you to have physical therapy for the soreness in the left shoulder and if the pain persists and physical therapy does not seem to be helping, you should call the orthopedic office for an injection in the shoulder Continue to be careful and not put yourself at risk for any further injuries of the left leg and left lower extremity  Arrie Senate MD

## 2017-05-03 NOTE — Patient Instructions (Signed)
We will arrange for you to have physical therapy for the soreness in the left shoulder and if the pain persists and physical therapy does not seem to be helping, you should call the orthopedic office for an injection in the shoulder Continue to be careful and not put yourself at risk for any further injuries of the left leg and left lower extremity

## 2017-05-05 ENCOUNTER — Ambulatory Visit: Payer: Medicare Other | Attending: Family Medicine | Admitting: Physical Therapy

## 2017-05-05 DIAGNOSIS — M25612 Stiffness of left shoulder, not elsewhere classified: Secondary | ICD-10-CM | POA: Insufficient documentation

## 2017-05-05 DIAGNOSIS — M25511 Pain in right shoulder: Secondary | ICD-10-CM | POA: Insufficient documentation

## 2017-05-05 DIAGNOSIS — M25611 Stiffness of right shoulder, not elsewhere classified: Secondary | ICD-10-CM | POA: Diagnosis not present

## 2017-05-05 DIAGNOSIS — M25512 Pain in left shoulder: Secondary | ICD-10-CM | POA: Insufficient documentation

## 2017-05-05 NOTE — Patient Instructions (Signed)
[facility]  DRY NEEDLING CONSENT FORM   Trigger point dry needling is a physical therapy approach to treat Myofascial Pain and Dysfunction.  Dry Needling (DN) is a valuable and effective way to deactivate myofascial trigger points (muscle knots/pain). It is skilled intervention that uses a thin filiform needle to penetrate the skin and stimulate underlying myofascial trigger points, muscular, and connective tissues for the management of neuromusculoskeletal pain and movement impairments.  A local twitch response (LTR) will be elicited.  This can sometimes feel like a deep ache in the muscle during the procedure. Multiple trigger points in multiple muscles can be treated during each treatment.  No medication of any kind is injected.   As with any medical treatment and procedure, there are possible adverse events.  While significant adverse events are uncommon, they do sometimes occur and must be considered prior to giving consent.  1. Dry needling often causes a "post needling soreness".  There can be an increase in pain from a couple of hours to 2-3 days, followed by an improvement in the overall pain state. 2. Any time a needle is used there is a risk of infection.  However, we are using new, sterile, and disposable needles; infections are extremely rare. 3. There is a possibility that you may bleed or bruise.  You may feel tired and some nausea following treatment. 4. There is a rare possibility of a pneumothorax (air in the chest cavity). 5. Allergic reaction to nickel in the stainless steel needle. 6. If a nerve is touched, it may cause paresthesia (a prickling/shock sensation) which is usually brief, but may continue for a couple of days.  Following treatment stay hydrated.  Continue regular activities but not too vigorous initially after treatment for 24-48 hours.  Dry Needling is best when combined with other physical therapy interventions such as  strengthening, stretching and other therapeutic modalities.   PLEASE ANSWER THE FOLLOWING QUESTIONS:  Do you have a lack of sensation?   Y/N  Do you have a phobia or fear of needles  Y/N  Are you pregnant?    Y/N If yes:  How many weeks? __________ Do you have any implanted devices?  Y/N If yes:  Pacemaker/Spinal Cord Stimulator/Deep Brain Stimulator/Insulin Pump/Other: ________________ Do you have any implants?  Y/N If yes: Breast/Facial/Pecs/Buttocks/Calves/Hip  Replacement/ Knee Replacement/Other: _________ Do you take any blood thinners?   Y/N If yes: Coumadin (Warfarin)/Other: ___________________ Do you have a bleeding disorder?   Y/N If yes: What kind: _________________________________ Do you take any immunosuppressants?  Y/N If yes:   What kind: _________________________________ Do you take anti-inflammatories?   Y/N If yes: What kind: Advil/Aspirin/Other: ________________ Have you ever been diagnosed with Scoliosis? Y/N Have you had back surgery?   Y/N If yes:  Laminectomy/Fusion/Other: ___________________   I have read, or had read to me, the above.  I have had the opportunity to ask any questions.  All of my questions have been answered to my satisfaction and I understand the risks involved with dry needling.  I consent to examination and treatment at  [facility], including dry needling, of any and all of my involved and affected muscles.  

## 2017-05-05 NOTE — Therapy (Signed)
[facility] [place], [place], [address] Phone: [phone]   Fax:  [phone]  Physical Therapy Evaluation  Patient Details  Name: [name] MRN: [id number] Date of Birth: July 01, 1940 Referring Provider: Redge Gainer MD.   Encounter Date: 05/05/2017  PT End of Session - 05/05/17 1200    Visit Number  1    Number of Visits  16    Date for PT Re-Evaluation  07/04/17    PT Start Time  0912    PT Stop Time  1010    PT Time Calculation (min)  58 min    Activity Tolerance  Patient tolerated treatment well    Behavior During Therapy  [facility] for tasks assessed/performed       Past Medical History:  Diagnosis Date  . ACL tear    right  Dr. Gladstone Pih   . Adenomatous polyp   . Anxiety   . Aorta aneurysm ([place])   . Arthritis   . Ascending aortic aneurysm ([place])    note per chart per Dr Lucianne Lei Tright 4.7 cm 04/15/2015   . B12 deficiency   . Cancer ([place])   . Cataracts, both eyes 10/2006  . Chronic bronchitis ([place])   . Constipation   . Depression   . DUB (dysfunctional uterine bleeding) 10/96  . ETD (eustachian tube dysfunction)   . Fall   . Fibromyalgia   . GERD (gastroesophageal reflux disease)   . Glaucoma    ([place]) Dr. Arnoldo Morale   . Glaucoma    bilaterally  . Helicobacter pylori (H. pylori)   . Hiatal hernia   . History of bronchitis   . History of frequent urinary tract infections   . History of left shoulder fracture    pt states fell off bed and broke ball in shoulder had rod placed   . Hyperlipidemia   . Hypertension   . Insomnia   . Leg wound, left    healed   . MVP (mitral valve prolapse)   . Occasional tremors    left arm   . Osteoarthritis   . Osteopenia   . Osteoporosis   . Other and unspecified hyperlipidemia   . Post-menopausal   . Thyroid cancer ([place])   . Thyroid nodule   . Tremor     Past Surgical History:  Procedure Laterality Date  . APPENDECTOMY    . BUNIONECTOMY  08/1999   right - Dr.  Irving Shows   . CHOLECYSTECTOMY  1979  . DILATION AND CURETTAGE OF UTERUS  03/31/95   Dr. Ovid Curd   . EYE SURGERY     also had left cataract removed   . FRACTURE SURGERY    . right knee replacement  3/11   Dr. Gladstone Pih  . rt. eye cataract  05/28/07  . VENTRAL HERNIA REPAIR  2/99    There were no vitals filed for this visit.   Subjective Assessment - 05/05/17 1201    Subjective  The patient reports she underwent a surgery on 04/10/17.  Upon waking she was experiencing a great deal of left shoulder pain.  Her pain is rated at an 8/10.  Movement increases pain and rest decreases her pain.    Pertinent History  Left humeral fracture ORIF (05/30/11).    Patient Stated Goals  Use arm with less pain.    Currently in Pain?  Yes    Pain Score  8     Pain Location  Shoulder    Pain Orientation  Left  Pain Descriptors / Indicators  Aching;Sharp;Shooting;Stabbing    Pain Type  Acute pain    Pain Onset  1 to 4 weeks ago    Pain Frequency  Constant    Aggravating Factors   See above.    Pain Relieving Factors  See above.         [facility] PT Assessment - 05/05/17 0001      Assessment   Medical Diagnosis  -- Chronic left shoulder pain.    Referring Provider  Redge Gainer MD.    Onset Date/Surgical Date  -- 04/10/17.      Precautions   Precautions  None      Restrictions   Weight Bearing Restrictions  No      Balance Screen   Has the patient fallen in the past 6 months  No    Has the patient had a decrease in activity level because of a fear of falling?   No    Is the patient reluctant to leave their home because of a fear of falling?   No      Home Film/video editor residence      Prior Function   Level of Independence  Independent      ROM / Strength   AROM / PROM / Strength  AROM;Strength      AROM   Overall AROM Comments  In supine:  AAROM into left shoulder flexion= 78 degrees; ER= 11 degrees and IR= 48 degrees.      Strength   Overall Strength  Comments  Left shoulder strength graded only grossly at 3+/5.      Palpation   Palpation comment  Tender to palpation over left bicipital groove and left UT.      Ambulation/Gait   Gait Comments  WNL.             Objective measurements completed on examination: See above findings.      [facility] Adult PT Treatment/Exercise - 05/05/17 0001      Modalities   Modalities  Electrical Stimulation;Moist Heat      Moist Heat Therapy   Number Minutes Moist Heat  20 Minutes    Moist Heat Location  -- Left shoulder.      Acupuncturist Location  -- Left shoulder.    Electrical Stimulation Action  IFC    Electrical Stimulation Parameters  80-150 Hz x 20 minutes.    Electrical Stimulation Goals  Pain               PT Short Term Goals - 05/05/17 1225      PT SHORT TERM GOAL #1   Title  STG's=LTG's.        PT Long Term Goals - 05/05/17 1226      PT LONG TERM GOAL #1   Title  Independent with a HEP.    Time  8    Period  Weeks    Status  New      PT LONG TERM GOAL #2   Title  Active left shoulder flexion to 145 degrees so the patient can easily reach overhead    Time  8    Period  Weeks    Status  New      PT LONG TERM GOAL #3   Title  Active ER to 70 degrees+ to allow for easily donning/doffing of apparel    Time  8    Period  Weeks  Status  New      PT LONG TERM GOAL #4   Title  Increase ROM so patient is able to reach behind back to L4.    Time  8    Period  Weeks    Status  New      PT LONG TERM GOAL #5   Title  Increase left shoulder strength to a solid 4+/5 to increase stability for performance of functional activities    Time  8    Period  Weeks    Status  New             Plan - 05/05/17 1222    Clinical Impression Statement  The patient presents to OPPT with c/o left shoulder pain.  She has limitation of range of motion and impaired functional usage of her left UE.  Patient will benefit from skilled  physical therapy intervention to address deficits and pain.    Clinical Presentation  Evolving    Clinical Presentation due to:  Not improving.    Clinical Decision Making  Moderate    Rehab Potential  Good    PT Frequency  2x / week    PT Duration  8 weeks    PT Treatment/Interventions  ADLs/Self Care Home Management;Cryotherapy;Electrical Stimulation;Ultrasound;Moist Heat;Therapeutic activities;Therapeutic exercise;Patient/family education;Manual techniques;Passive range of motion;Vasopneumatic Device;Dry needling    PT Next Visit Plan  PROM to left shoulder; pulleys; UE Ranger.  AAROM.  Progress to strengthening.  Modalites and STW/M as needed.  Dry needling.    Consulted and Agree with Plan of Care  Patient       Patient will benefit from skilled therapeutic intervention in order to improve the following deficits and impairments:  Decreased activity tolerance, Decreased strength, Decreased range of motion, Pain  Visit Diagnosis: Acute pain of left shoulder - Plan: PT plan of care cert/re-cert  Stiffness of left shoulder, not elsewhere classified - Plan: PT plan of care cert/re-cert  G-Codes - 73/53/29 1159    Functional Assessment Tool Used (Outpatient Only)  FOTO...66% limitation.    Functional Limitation  Self care    Self Care Current Status [phone])  At least 60 percent but less than 80 percent impaired, limited or restricted    Self Care Goal Status (S3419)  At least 20 percent but less than 40 percent impaired, limited or restricted        Problem List Patient Active Problem List   Diagnosis Date Noted  . Nephrolithiasis 04/07/2017  . Vitamin B 12 deficiency 03/01/2016  . Ascending aortic aneurysm ([place]) 08/20/2015  . Thyroid cancer ([place]) 07/30/2015  . Left thyroid nodule 07/27/2015  . Osteoporosis with fracture 04/16/2014  . At high risk for falls 04/16/2014  . Proximal humerus fracture 05/30/2011  . CHEST PAIN, PRECORDIAL 02/26/2010  . Hyperlipidemia 02/23/2010  .  Depression 02/23/2010  . GLAUCOMA 02/23/2010  . CATARACTS 02/23/2010  . RHEUMATIC FEVER 02/23/2010  . Essential hypertension 02/23/2010  . MITRAL VALVE PROLAPSE 02/23/2010  . GERD 02/23/2010  . Osteoarthritis 02/23/2010  . Primary fibromyalgia syndrome 02/23/2010    [name], [place] MPT 05/05/2017, 12:31 PM  [facility] [address] [place], [place], [address] Phone: [phone]   Fax:  [phone]  Name: [name] MRN: [id number] Date of Birth: 04/06/1941

## 2017-05-08 ENCOUNTER — Ambulatory Visit (INDEPENDENT_AMBULATORY_CARE_PROVIDER_SITE_OTHER): Payer: Medicare Other | Admitting: *Deleted

## 2017-05-08 ENCOUNTER — Ambulatory Visit: Payer: Medicare Other | Admitting: Physical Therapy

## 2017-05-08 DIAGNOSIS — M25611 Stiffness of right shoulder, not elsewhere classified: Secondary | ICD-10-CM | POA: Diagnosis not present

## 2017-05-08 DIAGNOSIS — E538 Deficiency of other specified B group vitamins: Secondary | ICD-10-CM

## 2017-05-08 DIAGNOSIS — M25511 Pain in right shoulder: Secondary | ICD-10-CM | POA: Diagnosis not present

## 2017-05-08 DIAGNOSIS — M25512 Pain in left shoulder: Secondary | ICD-10-CM

## 2017-05-08 DIAGNOSIS — M25612 Stiffness of left shoulder, not elsewhere classified: Secondary | ICD-10-CM | POA: Diagnosis not present

## 2017-05-08 NOTE — Progress Notes (Signed)
Pt given Cyanocobalamin inj Tolerated well 

## 2017-05-08 NOTE — Therapy (Signed)
[facility] [place], [place], [address] Phone: [phone]   Fax:  [phone]  Physical Therapy Treatment  Patient Details  Name: [name] MRN: [id number] Date of Birth: 1941/03/01 Referring Provider: Redge Gainer MD.   Encounter Date: 05/08/2017  PT End of Session - 05/08/17 1741    Visit Number  2    Number of Visits  16    Date for PT Re-Evaluation  07/04/17    PT Start Time  8938    PT Stop Time  1721    PT Time Calculation (min)  50 min    Activity Tolerance  Patient tolerated treatment well    Behavior During Therapy  [facility] for tasks assessed/performed       Past Medical History:  Diagnosis Date  . ACL tear    right  Dr. Gladstone Pih   . Adenomatous polyp   . Anxiety   . Aorta aneurysm ([place])   . Arthritis   . Ascending aortic aneurysm ([place])    note per chart per Dr Lucianne Lei Tright 4.7 cm 04/15/2015   . B12 deficiency   . Cancer ([place])   . Cataracts, both eyes 10/2006  . Chronic bronchitis ([place])   . Constipation   . Depression   . DUB (dysfunctional uterine bleeding) 10/96  . ETD (eustachian tube dysfunction)   . Fall   . Fibromyalgia   . GERD (gastroesophageal reflux disease)   . Glaucoma    (SE) Dr. Arnoldo Morale   . Glaucoma    bilaterally  . Helicobacter pylori (H. pylori)   . Hiatal hernia   . History of bronchitis   . History of frequent urinary tract infections   . History of left shoulder fracture    pt states fell off bed and broke ball in shoulder had rod placed   . Hyperlipidemia   . Hypertension   . Insomnia   . Leg wound, left    healed   . MVP (mitral valve prolapse)   . Occasional tremors    left arm   . Osteoarthritis   . Osteopenia   . Osteoporosis   . Other and unspecified hyperlipidemia   . Post-menopausal   . Thyroid cancer ([place])   . Thyroid nodule   . Tremor     Past Surgical History:  Procedure Laterality Date  . APPENDECTOMY    . BUNIONECTOMY  08/1999   right - Dr.  Irving Shows   . CHOLECYSTECTOMY  1979  . DILATION AND CURETTAGE OF UTERUS  03/31/95   Dr. Ovid Curd   . EYE SURGERY     also had left cataract removed   . FRACTURE SURGERY    . right knee replacement  3/11   Dr. Gladstone Pih  . rt. eye cataract  05/28/07  . VENTRAL HERNIA REPAIR  2/99    There were no vitals filed for this visit.  Subjective Assessment - 05/08/17 1739    Subjective  Patient has had difficulty with her shoulder since a kidney surgery.    Pertinent History  Left humeral fracture ORIF (05/30/11).    Patient Stated Goals  Use arm with less pain.    Currently in Pain?  Other (Comment) No pain assessment provided by patient         [facility] PT Assessment - 05/08/17 0001      Assessment   Onset Date/Surgical Date  04/10/17      Precautions   Precautions  None      Restrictions  Weight Bearing Restrictions  No                  [facility] Adult PT Treatment/Exercise - 05/08/17 0001      Exercises   Exercises  Shoulder      Shoulder Exercises: Supine   Flexion  AAROM;Both 3x10 reps      Shoulder Exercises: Pulleys   Flexion  Other (comment) x5 min    Other Pulley Exercises  Standing UE ranger flexion      Shoulder Exercises: ROM/Strengthening   UBE (Upper Arm Bike)  120 RPM x5 min      Modalities   Modalities  Electrical Stimulation;Moist Heat      Moist Heat Therapy   Number Minutes Moist Heat  15 Minutes    Moist Heat Location  Shoulder      Electrical Stimulation   Electrical Stimulation Location  L shoulder    Electrical Stimulation Action  Pre-Mod    Electrical Stimulation Parameters  80-150 hz x15 min    Electrical Stimulation Goals  Pain      Manual Therapy   Manual Therapy  Passive ROM    Passive ROM  PROM of L shoulder into flexion, ER, IR with gentle holds at end range               PT Short Term Goals - 05/05/17 1225      PT SHORT TERM GOAL #1   Title  STG's=LTG's.        PT Long Term Goals - 05/05/17 1226      PT LONG  TERM GOAL #1   Title  Independent with a HEP.    Time  8    Period  Weeks    Status  New      PT LONG TERM GOAL #2   Title  Active left shoulder flexion to 145 degrees so the patient can easily reach overhead    Time  8    Period  Weeks    Status  New      PT LONG TERM GOAL #3   Title  Active ER to 70 degrees+ to allow for easily donning/doffing of apparel    Time  8    Period  Weeks    Status  New      PT LONG TERM GOAL #4   Title  Increase ROM so patient is able to reach behind back to L4.    Time  8    Period  Weeks    Status  New      PT LONG TERM GOAL #5   Title  Increase left shoulder strength to a solid 4+/5 to increase stability for performance of functional activities    Time  8    Period  Weeks    Status  New            Plan - 05/08/17 1748    Clinical Impression Statement  Patient presented in clinic with AAROM flexion difficulties against gravity. Facial grimacing and discomfort noted with patient's facial expressions with UE ranger into flexion. Firm end feels and smooth arc of motion of L shoulder into all directions assessed. Patient limited in both flexion and ER passively. Normal modalities response noted following removal of the modalities.    Rehab Potential  Good    PT Frequency  2x / week    PT Duration  8 weeks    PT Treatment/Interventions  ADLs/Self Care Home Management;Cryotherapy;Electrical Stimulation;Ultrasound;Moist Heat;Therapeutic activities;Therapeutic exercise;Patient/family  education;Manual techniques;Passive range of motion;Vasopneumatic Device;Dry needling    PT Next Visit Plan  PROM to left shoulder; pulleys; UE Ranger.  AAROM.  Progress to strengthening.  Modalites and STW/M as needed.  Dry needling.    Consulted and Agree with Plan of Care  Patient       Patient will benefit from skilled therapeutic intervention in order to improve the following deficits and impairments:  Decreased activity tolerance, Decreased strength, Decreased  range of motion, Pain  Visit Diagnosis: Acute pain of left shoulder  Stiffness of left shoulder, not elsewhere classified     Problem List Patient Active Problem List   Diagnosis Date Noted  . Nephrolithiasis 04/07/2017  . Vitamin B 12 deficiency 03/01/2016  . Ascending aortic aneurysm ([place]) 08/20/2015  . Thyroid cancer ([place]) 07/30/2015  . Left thyroid nodule 07/27/2015  . Osteoporosis with fracture 04/16/2014  . At high risk for falls 04/16/2014  . Proximal humerus fracture 05/30/2011  . CHEST PAIN, PRECORDIAL 02/26/2010  . Hyperlipidemia 02/23/2010  . Depression 02/23/2010  . GLAUCOMA 02/23/2010  . CATARACTS 02/23/2010  . RHEUMATIC FEVER 02/23/2010  . Essential hypertension 02/23/2010  . MITRAL VALVE PROLAPSE 02/23/2010  . GERD 02/23/2010  . Osteoarthritis 02/23/2010  . Primary fibromyalgia syndrome 02/23/2010    Wynelle Fanny, PTA 05/08/2017, 5:51 PM  [facility] [address] [place], [place], [address] Phone: [phone]   Fax:  [phone]  Name: [name] MRN: [id number] Date of Birth: Oct 23, 1940

## 2017-05-10 ENCOUNTER — Encounter: Payer: Self-pay | Admitting: Physical Therapy

## 2017-05-10 ENCOUNTER — Ambulatory Visit: Payer: Medicare Other | Admitting: Physical Therapy

## 2017-05-10 DIAGNOSIS — M25612 Stiffness of left shoulder, not elsewhere classified: Secondary | ICD-10-CM

## 2017-05-10 DIAGNOSIS — M25511 Pain in right shoulder: Secondary | ICD-10-CM | POA: Diagnosis not present

## 2017-05-10 DIAGNOSIS — M25611 Stiffness of right shoulder, not elsewhere classified: Secondary | ICD-10-CM | POA: Diagnosis not present

## 2017-05-10 DIAGNOSIS — M25512 Pain in left shoulder: Secondary | ICD-10-CM | POA: Diagnosis not present

## 2017-05-10 NOTE — Therapy (Signed)
[facility] [place], [place], [address] Phone: [phone]   Fax:  [phone]  Physical Therapy Treatment  Patient Details  Name: [name] MRN: [id number] Date of Birth: 04/16/1941 Referring Provider: Redge Gainer MD.   Encounter Date: 05/10/2017  PT End of Session - 05/10/17 1037    Visit Number  3    Number of Visits  16    Date for PT Re-Evaluation  07/04/17    PT Start Time  1034    PT Stop Time  1122    PT Time Calculation (min)  48 min    Activity Tolerance  Patient tolerated treatment well    Behavior During Therapy  [facility] for tasks assessed/performed       Past Medical History:  Diagnosis Date  . ACL tear    right  Dr. Gladstone Pih   . Adenomatous polyp   . Anxiety   . Aorta aneurysm ([place])   . Arthritis   . Ascending aortic aneurysm ([place])    note per chart per Dr Lucianne Lei Tright 4.7 cm 04/15/2015   . B12 deficiency   . Cancer ([place])   . Cataracts, both eyes 10/2006  . Chronic bronchitis ([place])   . Constipation   . Depression   . DUB (dysfunctional uterine bleeding) 10/96  . ETD (eustachian tube dysfunction)   . Fall   . Fibromyalgia   . GERD (gastroesophageal reflux disease)   . Glaucoma    (SE) Dr. Arnoldo Morale   . Glaucoma    bilaterally  . Helicobacter pylori (H. pylori)   . Hiatal hernia   . History of bronchitis   . History of frequent urinary tract infections   . History of left shoulder fracture    pt states fell off bed and broke ball in shoulder had rod placed   . Hyperlipidemia   . Hypertension   . Insomnia   . Leg wound, left    healed   . MVP (mitral valve prolapse)   . Occasional tremors    left arm   . Osteoarthritis   . Osteopenia   . Osteoporosis   . Other and unspecified hyperlipidemia   . Post-menopausal   . Thyroid cancer ([place])   . Thyroid nodule   . Tremor     Past Surgical History:  Procedure Laterality Date  . APPENDECTOMY    . BUNIONECTOMY  08/1999   right - Dr.  Irving Shows   . CHOLECYSTECTOMY  1979  . DILATION AND CURETTAGE OF UTERUS  03/31/95   Dr. Ovid Curd   . EYE SURGERY     also had left cataract removed   . FRACTURE SURGERY    . right knee replacement  3/11   Dr. Gladstone Pih  . rt. eye cataract  05/28/07  . VENTRAL HERNIA REPAIR  2/99    There were no vitals filed for this visit.  Subjective Assessment - 05/10/17 1036    Subjective  Patient arrived with reports of shoulder soreness.     Pertinent History  Left humeral fracture ORIF (05/30/11).    Patient Stated Goals  Use arm with less pain.    Currently in Pain?  Yes    Pain Score  8     Pain Location  Shoulder    Pain Orientation  Left    Pain Descriptors / Indicators  Sore    Pain Type  Acute pain    Pain Onset  1 to 4 weeks ago  [facility] PT Assessment - 05/10/17 0001      Assessment   Onset Date/Surgical Date  04/10/17      Precautions   Precautions  None      Restrictions   Weight Bearing Restrictions  No                  [facility] Adult PT Treatment/Exercise - 05/10/17 0001      Shoulder Exercises: Pulleys   Flexion  Other (comment) x7 min      Shoulder Exercises: ROM/Strengthening   UBE (Upper Arm Bike)  120 RPM x5 min      Modalities   Modalities  Electrical Stimulation;Moist Heat      Moist Heat Therapy   Number Minutes Moist Heat  15 Minutes    Moist Heat Location  Shoulder      Electrical Stimulation   Electrical Stimulation Location  L shoulder    Electrical Stimulation Action  Pre-mod    Electrical Stimulation Parameters  80-150 hz x15 min    Electrical Stimulation Goals  Pain      Manual Therapy   Manual Therapy  Passive ROM    Passive ROM  PROM of L shoulder into flexion, ER, IR with gentle holds at end range               PT Short Term Goals - 05/05/17 1225      PT SHORT TERM GOAL #1   Title  STG's=LTG's.        PT Long Term Goals - 05/05/17 1226      PT LONG TERM GOAL #1   Title  Independent with a HEP.    Time  8     Period  Weeks    Status  New      PT LONG TERM GOAL #2   Title  Active left shoulder flexion to 145 degrees so the patient can easily reach overhead    Time  8    Period  Weeks    Status  New      PT LONG TERM GOAL #3   Title  Active ER to 70 degrees+ to allow for easily donning/doffing of apparel    Time  8    Period  Weeks    Status  New      PT LONG TERM GOAL #4   Title  Increase ROM so patient is able to reach behind back to L4.    Time  8    Period  Weeks    Status  New      PT LONG TERM GOAL #5   Title  Increase left shoulder strength to a solid 4+/5 to increase stability for performance of functional activities    Time  8    Period  Weeks    Status  New            Plan - 05/10/17 1116    Clinical Impression Statement  More conservative treatment completed today secondary to increased L shoulder soreness reported by patient. Firm end feels noted with PROM and smooth arc of motion noted for all directions of L shoulder PROM today. Normal modalities response noted following removal of the modalities. Patient indicated discomfort and soreness along tricep region today. Patient more restricted in L shoulder ER upon assessment,    Rehab Potential  Good    PT Frequency  2x / week    PT Duration  8 weeks    PT Treatment/Interventions  ADLs/Self Care  Home Management;Cryotherapy;Electrical Stimulation;Ultrasound;Moist Heat;Therapeutic activities;Therapeutic exercise;Patient/family education;Manual techniques;Passive range of motion;Vasopneumatic Device;Dry needling    PT Next Visit Plan  PROM to left shoulder; pulleys; UE Ranger.  AAROM.  Progress to strengthening.  Modalites and STW/M as needed.  Dry needling.    Consulted and Agree with Plan of Care  Patient       Patient will benefit from skilled therapeutic intervention in order to improve the following deficits and impairments:  Decreased activity tolerance, Decreased strength, Decreased range of motion,  Pain  Visit Diagnosis: Acute pain of left shoulder  Stiffness of left shoulder, not elsewhere classified     Problem List Patient Active Problem List   Diagnosis Date Noted  . Nephrolithiasis 04/07/2017  . Vitamin B 12 deficiency 03/01/2016  . Ascending aortic aneurysm ([place]) 08/20/2015  . Thyroid cancer ([place]) 07/30/2015  . Left thyroid nodule 07/27/2015  . Osteoporosis with fracture 04/16/2014  . At high risk for falls 04/16/2014  . Proximal humerus fracture 05/30/2011  . CHEST PAIN, PRECORDIAL 02/26/2010  . Hyperlipidemia 02/23/2010  . Depression 02/23/2010  . GLAUCOMA 02/23/2010  . CATARACTS 02/23/2010  . RHEUMATIC FEVER 02/23/2010  . Essential hypertension 02/23/2010  . MITRAL VALVE PROLAPSE 02/23/2010  . GERD 02/23/2010  . Osteoarthritis 02/23/2010  . Primary fibromyalgia syndrome 02/23/2010    Standley Brooking, PTA 05/10/2017, 11:59 AM  [facility] [address] [place], [place], [address] Phone: [phone]   Fax:  [phone]  Name: [name] MRN: [id number] Date of Birth: 1940-11-30

## 2017-05-16 ENCOUNTER — Ambulatory Visit: Payer: Medicare Other | Admitting: *Deleted

## 2017-05-16 DIAGNOSIS — M25511 Pain in right shoulder: Secondary | ICD-10-CM | POA: Diagnosis not present

## 2017-05-16 DIAGNOSIS — M25611 Stiffness of right shoulder, not elsewhere classified: Secondary | ICD-10-CM | POA: Diagnosis not present

## 2017-05-16 DIAGNOSIS — M25512 Pain in left shoulder: Secondary | ICD-10-CM | POA: Diagnosis not present

## 2017-05-16 DIAGNOSIS — M25612 Stiffness of left shoulder, not elsewhere classified: Secondary | ICD-10-CM

## 2017-05-16 NOTE — Therapy (Addendum)
[facility] [place], [place], [address] Phone: [phone]   Fax:  [phone]  Physical Therapy Treatment  Patient Details  Name: [name] MRN: [id number] Date of Birth: 1940/11/26 Referring Provider: Redge Gainer MD.   Encounter Date: 05/16/2017  PT End of Session - 05/16/17 1044    Visit Number  4    Number of Visits  16    Date for PT Re-Evaluation  07/04/17    PT Start Time  1030    PT Stop Time  1119    PT Time Calculation (min)  49 min       Past Medical History:  Diagnosis Date  . ACL tear    right  Dr. Gladstone Pih   . Adenomatous polyp   . Anxiety   . Aorta aneurysm ([place])   . Arthritis   . Ascending aortic aneurysm ([place])    note per chart per Dr Lucianne Lei Tright 4.7 cm 04/15/2015   . B12 deficiency   . Cancer ([place])   . Cataracts, both eyes 10/2006  . Chronic bronchitis ([place])   . Constipation   . Depression   . DUB (dysfunctional uterine bleeding) 10/96  . ETD (eustachian tube dysfunction)   . Fall   . Fibromyalgia   . GERD (gastroesophageal reflux disease)   . Glaucoma    (SE) Dr. Arnoldo Morale   . Glaucoma    bilaterally  . Helicobacter pylori (H. pylori)   . Hiatal hernia   . History of bronchitis   . History of frequent urinary tract infections   . History of left shoulder fracture    pt states fell off bed and broke ball in shoulder had rod placed   . Hyperlipidemia   . Hypertension   . Insomnia   . Leg wound, left    healed   . MVP (mitral valve prolapse)   . Occasional tremors    left arm   . Osteoarthritis   . Osteopenia   . Osteoporosis   . Other and unspecified hyperlipidemia   . Post-menopausal   . Thyroid cancer ([place])   . Thyroid nodule   . Tremor     Past Surgical History:  Procedure Laterality Date  . APPENDECTOMY    . BUNIONECTOMY  08/1999   right - Dr. Irving Shows   . CHOLECYSTECTOMY  1979  . DILATION AND CURETTAGE OF UTERUS  03/31/95   Dr. Ovid Curd   . EYE SURGERY     also  had left cataract removed   . FRACTURE SURGERY    . NEPHROLITHOTOMY Left 04/07/2017   Procedure: NEPHROLITHOTOMY PERCUTANEOUS WITH SURGEON ACCESS;  Surgeon: Ardis Hughs, MD;  Location: [facility];  Service: Urology;  Laterality: Left;  . ORIF HUMERUS FRACTURE  05/30/2011   Procedure: OPEN REDUCTION INTERNAL FIXATION (ORIF) PROXIMAL HUMERUS FRACTURE;  Surgeon: Augustin Schooling;  Location: [facility];  Service: Orthopedics;  Laterality: Left;  open reduction internal fixation of proximal humerus fracture  . right knee replacement  3/11   Dr. Gladstone Pih  . rt. eye cataract  05/28/07  . THYROID LOBECTOMY Left 07/27/2015   Procedure: LEFT THYROID LOBECTOMY;  Surgeon: Fanny Skates, MD;  Location: [facility];  Service: General;  Laterality: Left;  . THYROIDECTOMY N/A 08/06/2015   Procedure: RIGHT THYROID LOBECTOMY, REIMPLANTATION PARATHYROID;  Surgeon: Fanny Skates, MD;  Location: [facility];  Service: General;  Laterality: N/A;  . VENTRAL HERNIA REPAIR  2/99    There were no vitals filed for this  visit.  Subjective Assessment - 05/16/17 1042    Subjective  Patient arrived with reports of shoulder soreness.     Pertinent History  Left humeral fracture ORIF (05/30/11).    Patient Stated Goals  Use arm with less pain.    Currently in Pain?  Yes    Pain Score  8     Pain Location  Shoulder                      [facility] Adult PT Treatment/Exercise - 05/16/17 0001      Exercises   Exercises  Shoulder      Shoulder Exercises: Supine   Flexion  AAROM;Both 3x10 reps      Shoulder Exercises: Pulleys   Flexion  Other (comment) x7 min    Other Pulley Exercises  Standing UE ranger flexion x 5 mins      Shoulder Exercises: ROM/Strengthening   UBE (Upper Arm Bike)  120 RPM x5 min      Modalities   Modalities  Electrical Stimulation;Moist Heat      Moist Heat Therapy   Number Minutes Moist Heat  15 Minutes    Moist Heat Location  Shoulder      Electrical Stimulation   Electrical Stimulation  Location  L shoulder  premod x 15 mins 80-150hz    Electrical Stimulation Goals  Pain      Manual Therapy   Manual Therapy  Passive ROM    Passive ROM  PROM of L shoulder into flexion, ER, IR with gentle holds at end range               PT Short Term Goals - 05/05/17 1225      PT SHORT TERM GOAL #1   Title  STG's=LTG's.        PT Long Term Goals - 05/05/17 1226      PT LONG TERM GOAL #1   Title  Independent with a HEP.    Time  8    Period  Weeks    Status  New      PT LONG TERM GOAL #2   Title  Active left shoulder flexion to 145 degrees so the patient can easily reach overhead    Time  8    Period  Weeks    Status  New      PT LONG TERM GOAL #3   Title  Active ER to 70 degrees+ to allow for easily donning/doffing of apparel    Time  8    Period  Weeks    Status  New      PT LONG TERM GOAL #4   Title  Increase ROM so patient is able to reach behind back to L4.    Time  8    Period  Weeks    Status  New      PT LONG TERM GOAL #5   Title  Increase left shoulder strength to a solid 4+/5 to increase stability for performance of functional activities    Time  8    Period  Weeks    Status  New            Plan - 05/16/17 1812    Clinical Impression Statement  Pt arrived today doing fairly well and was able to perform Therex a little easier. Firm endfeels still noted during PROM, but with a smooth arc.  ER motion still the most limited.    Rehab Potential  Good    PT Treatment/Interventions  ADLs/Self Care Home Management;Cryotherapy;Electrical Stimulation;Ultrasound;Moist Heat;Therapeutic activities;Therapeutic exercise;Patient/family education;Manual techniques;Passive range of motion;Vasopneumatic Device;Dry needling    PT Next Visit Plan  PROM to left shoulder; pulleys; UE Ranger.  AAROM.  Progress to strengthening.  Modalites and STW/M as needed.  Dry needling.    Consulted and Agree with Plan of Care  Patient       Patient will benefit from  skilled therapeutic intervention in order to improve the following deficits and impairments:  Decreased activity tolerance, Decreased strength, Decreased range of motion, Pain  Visit Diagnosis: Acute pain of left shoulder  Stiffness of left shoulder, not elsewhere classified  Stiffness of right shoulder, not elsewhere classified  Acute pain of right shoulder     Problem List Patient Active Problem List   Diagnosis Date Noted  . Nephrolithiasis 04/07/2017  . Vitamin B 12 deficiency 03/01/2016  . Ascending aortic aneurysm ([place]) 08/20/2015  . Thyroid cancer ([place]) 07/30/2015  . Left thyroid nodule 07/27/2015  . Osteoporosis with fracture 04/16/2014  . At high risk for falls 04/16/2014  . Proximal humerus fracture 05/30/2011  . CHEST PAIN, PRECORDIAL 02/26/2010  . Hyperlipidemia 02/23/2010  . Depression 02/23/2010  . GLAUCOMA 02/23/2010  . CATARACTS 02/23/2010  . RHEUMATIC FEVER 02/23/2010  . Essential hypertension 02/23/2010  . MITRAL VALVE PROLAPSE 02/23/2010  . GERD 02/23/2010  . Osteoarthritis 02/23/2010  . Primary fibromyalgia syndrome 02/23/2010    Vicky Schleich,CHRIS, PTA 05/16/2017, 6:15 PM  [facility] [address] [place], [place], [address] Phone: [phone]   Fax:  [phone]  Name: [name] MRN: [id number] Date of Birth: Jul 16, 1940  PHYSICAL THERAPY DISCHARGE SUMMARY  Visits from Start of Care: 4.  Current functional level related to goals / functional outcomes: See above.   Remaining deficits: See below.   Education / Equipment: HEP. Plan: Patient agrees to discharge.  Patient goals were not met. Patient is being discharged due to not returning since the last visit.  ?????          Mali Applegate MPT

## 2017-05-19 ENCOUNTER — Encounter: Payer: Self-pay | Admitting: Pediatrics

## 2017-05-19 ENCOUNTER — Ambulatory Visit: Payer: Medicare Other | Admitting: Pediatrics

## 2017-05-19 VITALS — BP 121/66 | HR 75 | Temp 97.6°F | Resp 18 | Ht 64.0 in | Wt 171.6 lb

## 2017-05-19 DIAGNOSIS — J069 Acute upper respiratory infection, unspecified: Secondary | ICD-10-CM | POA: Diagnosis not present

## 2017-05-19 DIAGNOSIS — J309 Allergic rhinitis, unspecified: Secondary | ICD-10-CM | POA: Diagnosis not present

## 2017-05-19 MED ORDER — FLUTICASONE PROPIONATE 50 MCG/ACT NA SUSP: 2.0000 | Freq: Every day | NASAL | 6 refills | Status: DC

## 2017-05-19 NOTE — Patient Instructions (Addendum)
Continue tylenol Can take an antihistamine pill for nasal congestion  Upper Respiratory Infection, Adult Most upper respiratory infections (URIs) are caused by a virus. A URI affects the nose, throat, and upper air passages. The most common type of URI is often called "the common cold." Follow these instructions at home:  Take medicines only as told by your doctor.  Gargle warm saltwater or take cough drops to comfort your throat as told by your doctor.  Use a warm mist humidifier or inhale steam from a shower to increase air moisture. This may make it easier to breathe.  Drink enough fluid to keep your pee (urine) clear or pale yellow.  Eat soups and other clear broths.  Have a healthy diet.  Rest as needed.  Go back to work when your fever is gone or your doctor says it is okay. ? You may need to stay home longer to avoid giving your URI to others. ? You can also wear a face mask and wash your hands often to prevent spread of the virus.  Use your inhaler more if you have asthma.  Do not use any tobacco products, including cigarettes, chewing tobacco, or electronic cigarettes. If you need help quitting, ask your doctor. Contact a doctor if:  You are getting worse, not better.  Your symptoms are not helped by medicine.  You have chills.  You are getting more short of breath.  You have brown or red mucus.  You have yellow or brown discharge from your nose.  You have pain in your face, especially when you bend forward.  You have a fever.  You have puffy (swollen) neck glands.  You have pain while swallowing.  You have white areas in the back of your throat. Get help right away if:  You have very bad or constant: ? Headache. ? Ear pain. ? Pain in your forehead, behind your eyes, and over your cheekbones (sinus pain). ? Chest pain.  You have long-lasting (chronic) lung disease and any of the following: ? Wheezing. ? Long-lasting cough. ? Coughing up  blood. ? A change in your usual mucus.  You have a stiff neck.  You have changes in your: ? Vision. ? Hearing. ? Thinking. ? Mood. This information is not intended to replace advice given to you by your health care provider. Make sure you discuss any questions you have with your health care provider. Document Released: 11/30/2007 Document Revised: 02/14/2016 Document Reviewed: 09/18/2013 Elsevier Interactive Patient Education  2018 [employer].

## 2017-05-19 NOTE — Progress Notes (Signed)
  Subjective:   Patient ID: [name], female    DOB: 28-Sep-1940, 76 y.o.   MRN: [id number] CC: Cough; Hoarse; and Fatigue  HPI: [name] is a 76 y.o. female presenting for Cough; Hoarse; and Fatigue  Has been feeling tired past few days Yesterday started having runny nose, coughing kept her awake last night Some sore throat today Voice feels more hoarse today Appetite down today No fevers Has been taking tylenol regularly since procedure for kidney stones last month   Relevant past medical, surgical, family and social history reviewed. Allergies and medications reviewed and updated. Social History   Tobacco Use  Smoking Status Never Smoker  Smokeless Tobacco Never Used   ROS: Per HPI   Objective:    BP 121/66   Pulse 75   Temp 97.6 F (36.4 C) (Oral)   Resp 18   Ht 5\' 4"  (1.626 m)   Wt 171 lb 9.6 oz (77.8 kg)   SpO2 99%   BMI 29.46 kg/m   Wt Readings from Last 3 Encounters:  05/19/17 171 lb 9.6 oz (77.8 kg)  05/03/17 167 lb (75.8 kg)  04/07/17 169 lb (76.7 kg)    Gen: NAD, alert, cooperative with exam, NCAT, voice slightly hoarse EYES: EOMI, no conjunctival injection, or no icterus ENT:  R TM perforated, no drainage seen, L TM nl, OP with mild erythema LYMPH: no cervical LAD CV: NRRR, normal S1/S2, no murmur, distal pulses 2+ b/l Resp: CTABL, no wheezes, normal WOB Abd: +BS, soft, NTND. Ext: No edema, warm Neuro: Alert and oriented MSK: normal muscle bulk  Assessment & Plan:  [name] was seen today for cough, hoarse and fatigue.  Diagnoses and all orders for this visit:  Acute URI Discussed symptomatic care, return precautions  Allergic rhinitis -     fluticasone (FLONASE) 50 MCG/ACT nasal spray; Place 2 sprays into both nostrils daily.   Follow up plan: Return if symptoms worsen or fail to improve. Assunta Found, MD [facility]

## 2017-05-24 ENCOUNTER — Other Ambulatory Visit: Payer: Self-pay

## 2017-05-24 ENCOUNTER — Encounter: Payer: Medicare Other | Admitting: Physical Therapy

## 2017-05-24 ENCOUNTER — Other Ambulatory Visit: Payer: Self-pay | Admitting: *Deleted

## 2017-05-24 ENCOUNTER — Encounter: Payer: Self-pay | Admitting: Cardiothoracic Surgery

## 2017-05-24 ENCOUNTER — Ambulatory Visit
Admission: RE | Admit: 2017-05-24 | Discharge: 2017-05-24 | Disposition: A | Discharge status: Home / Self Care | Payer: Medicare Other | Source: Ambulatory Visit | Attending: Cardiothoracic Surgery | Admitting: Cardiothoracic Surgery

## 2017-05-24 ENCOUNTER — Ambulatory Visit: Payer: Medicare Other | Admitting: Cardiothoracic Surgery

## 2017-05-24 VITALS — BP 132/65 | HR 60 | Ht 64.0 in | Wt 171.0 lb

## 2017-05-24 DIAGNOSIS — I712 Thoracic aortic aneurysm, without rupture, unspecified: Secondary | ICD-10-CM

## 2017-05-24 MED ORDER — IOPAMIDOL (ISOVUE-370) INJECTION 76%: 75.0000 mL | Freq: Once | INTRAVENOUS | Status: DC | PRN

## 2017-05-24 NOTE — H&P (View-Only) (Signed)
PCP is Chipper Herb, MD Referring Provider is Chipper Herb, MD  Chief Complaint  Patient presents with  . Follow-up    HPI: Returns with CTA of the thoracic aorta for follow-up of known fusiform ascending aneurysm. It is asymptomatic. Her ascending aorta has slowly increased in diameter over the past 2 years from 4.7 now up to 5.3, 5.4 cm by my measurement. Her blood pressure has been under good control. She is a nonsmoker. She was recently hospitalized for treatment of a right kidney stone which was removed through catheter therapy. A echocardiogram 2 and half years ago showed mild aortic insufficiency good LV function. She has had previous orthopedic surgery including left shoulder surgery, right knee replacement, and she has had partial thyroidectomy for cancer.   Past Medical History:  Diagnosis Date  . ACL tear    right  Dr. Gladstone Pih   . Adenomatous polyp   . Anxiety   . Aorta aneurysm ([place])   . Arthritis   . Ascending aortic aneurysm ([place])    note per chart per Dr Lucianne Lei Tright 4.7 cm 04/15/2015   . B12 deficiency   . Cancer ([place])   . Cataracts, both eyes 10/2006  . Chronic bronchitis ([place])   . Constipation   . Depression   . DUB (dysfunctional uterine bleeding) 10/96  . ETD (eustachian tube dysfunction)   . Fall   . Fibromyalgia   . GERD (gastroesophageal reflux disease)   . Glaucoma    ([place]) Dr. Arnoldo Morale   . Glaucoma    bilaterally  . Helicobacter pylori (H. pylori)   . Hiatal hernia   . History of bronchitis   . History of frequent urinary tract infections   . History of left shoulder fracture    pt states fell off bed and broke ball in shoulder had rod placed   . Hyperlipidemia   . Hypertension   . Insomnia   . Leg wound, left    healed   . MVP (mitral valve prolapse)   . Occasional tremors    left arm   . Osteoarthritis   . Osteopenia   . Osteoporosis   . Other and unspecified hyperlipidemia   . Post-menopausal   . Thyroid cancer ([place])   . Thyroid  nodule   . Tremor     Past Surgical History:  Procedure Laterality Date  . APPENDECTOMY    . BUNIONECTOMY  08/1999   right - Dr. Irving Shows   . CHOLECYSTECTOMY  1979  . DILATION AND CURETTAGE OF UTERUS  03/31/95   Dr. Ovid Curd   . EYE SURGERY     also had left cataract removed   . FRACTURE SURGERY    . NEPHROLITHOTOMY Left 04/07/2017   Procedure: NEPHROLITHOTOMY PERCUTANEOUS WITH SURGEON ACCESS;  Surgeon: Ardis Hughs, MD;  Location: [facility];  Service: Urology;  Laterality: Left;  . ORIF HUMERUS FRACTURE  05/30/2011   Procedure: OPEN REDUCTION INTERNAL FIXATION (ORIF) PROXIMAL HUMERUS FRACTURE;  Surgeon: Augustin Schooling;  Location: [facility];  Service: Orthopedics;  Laterality: Left;  open reduction internal fixation of proximal humerus fracture  . right knee replacement  3/11   Dr. Gladstone Pih  . rt. eye cataract  05/28/07  . THYROID LOBECTOMY Left 07/27/2015   Procedure: LEFT THYROID LOBECTOMY;  Surgeon: Fanny Skates, MD;  Location: [facility];  Service: General;  Laterality: Left;  . THYROIDECTOMY N/A 08/06/2015   Procedure: RIGHT THYROID LOBECTOMY, REIMPLANTATION PARATHYROID;  Surgeon: Fanny Skates, MD;  Location: [facility];  Service: General;  Laterality: N/A;  . VENTRAL HERNIA REPAIR  2/99    Family History  Problem Relation Age of Onset  . Cancer Mother        PANCREATIC   . Osteoporosis Mother   . Hip fracture Mother   . Heart attack Father 63       Died 68  . Heart disease Father   . Arthritis Father   . Hypertension Sister   . Cancer Sister        skin  . Hypertension Sister   . Breast cancer Neg Hx     Social History Social History   Tobacco Use  . Smoking status: Never Smoker  . Smokeless tobacco: Never Used  Substance Use Topics  . Alcohol use: No  . Drug use: No    Current Outpatient Medications  Medication Sig Dispense Refill  . acetaminophen (TYLENOL) 650 MG CR tablet Take 650 mg by mouth every 8 (eight) hours as needed for pain.    Marland Kitchen aspirin 81 MG tablet  Take 1 tablet (81 mg total) by mouth every other day. (Patient taking differently: Take 81 mg by mouth at bedtime. ) 30 tablet   . atenolol (TENORMIN) 50 MG tablet TAKE 1 TABLET DAILY 30 tablet 5  . Bisacodyl (CORRECTOL PO) Take 2 tablets by mouth every other day.    . calcium carbonate (OS-CAL - DOSED IN MG OF ELEMENTAL CALCIUM) 1250 (500 Ca) MG tablet Take 2 tablets (1,000 mg of elemental calcium total) by mouth 3 (three) times daily with meals. (Patient taking differently: Take 2 tablets by mouth 3 (three) times daily with meals. 5 a day) 50 tablet 2  . celecoxib (CELEBREX) 200 MG capsule TAKE (1) CAPSULE DAILY 30 capsule 0  . clobetasol cream (TEMOVATE) 1.60 % Apply 1 application topically 2 (two) times daily. (Patient taking differently: Apply 1 application topically daily as needed (rash). ) 30 g 2  . clorazepate (TRANXENE) 7.5 MG tablet TAKE 1 TABLET DAILY AS NEEDED (Patient taking differently: TAKE 1 TABLET DAILY AS NEEDED FOR ANXIETY) 30 tablet 1  . docusate sodium (COLACE) 100 MG capsule Take 2 capsules (200 mg total) by mouth 2 (two) times daily. 120 capsule 11  . famotidine (PEPCID) 20 MG tablet Take 1 tablet (20 mg total) by mouth 2 (two) times daily. 60 tablet 1  . fluticasone (FLONASE) 50 MCG/ACT nasal spray Place 2 sprays into both nostrils daily. 16 g 6  . gabapentin (NEURONTIN) 100 MG capsule Take 1-2 capsules QHS as directed 180 capsule 1  . levothyroxine (SYNTHROID, LEVOTHROID) 112 MCG tablet Take 1 tablet daily by mouth.  4  . magnesium citrate SOLN Take 1 Bottle by mouth once.    . mupirocin ointment (BACTROBAN) 2 % Apply 1 application topically 2 (two) times daily. (Patient taking differently: Apply 1 application topically 2 (two) times daily as needed (rash). ) 22 g 1  . nystatin (MYCOSTATIN/NYSTOP) powder Apply 1 g topically daily as needed (rash).   0  . Omega-3 Fatty Acids (FISH OIL) 600 MG CAPS Take 600 mg by mouth daily.    Marland Kitchen PROCTOZONE-HC 2.5 % rectal cream APPLY TO  RECTALLY 2 TIMES A DAY AS NEEDED (Patient taking differently: APPLY TO RECTALLY 1 TIME A DAY) 30 g 1  . ranitidine (ZANTAC) 150 MG tablet Take 150 mg by mouth 2 (two) times daily.    . simvastatin (ZOCOR) 80 MG tablet TAKE ONE TABLET AT BEDTIME (Patient taking differently: TAKE ONE HALF TABLET  AT BEDTIME) 30 tablet 5  . traMADol (ULTRAM) 50 MG tablet Take 1-2 tablets (50-100 mg total) by mouth every 6 (six) hours as needed for moderate pain. 20 tablet 0  . TRAVATAN Z 0.004 % SOLN ophthalmic solution INSTILL 1 DROP INTO RIGHT EYE AT BEDTIME (Patient taking differently: INSTILL 1 DROP INTO BOTH EYES AT BEDTIME) 2.5 mL 2  . triamcinolone cream (KENALOG) 0.1 % Apply 1 application topically 3 (three) times daily. Avoid face and genitalia 28.4 g 0  . venlafaxine XR (EFFEXOR-XR) 75 MG 24 hr capsule TAKE (1) CAPSULE DAILY (Patient taking differently: Take 75 mg by mouth daily with breakfast. TAKE (1) CAPSULE DAILY) 90 capsule 1  . Vitamin D, Ergocalciferol, (DRISDOL) 50000 units CAPS capsule Take 50,000 Units by mouth every 7 (seven) days. On Sundays    . zolpidem (AMBIEN) 10 MG tablet TAKE 1/2 TABLET AT BEDTIME AS NEEDED (Patient taking differently: TAKE 1/2 TABLET AT BEDTIME) 45 tablet 2   Current Facility-Administered Medications  Medication Dose Route Frequency Provider Last Rate Last Dose  . cyanocobalamin ((VITAMIN B-12)) injection 1,000 mcg  1,000 mcg Intramuscular Q30 days Chipper Herb, MD   1,000 mcg at 05/08/17 1546   Facility-Administered Medications Ordered in Other Visits  Medication Dose Route Frequency Provider Last Rate Last Dose  . iopamidol (ISOVUE-370) 76 % injection 75 mL  75 mL Intravenous Once PRN Ivin Poot, MD        Allergies  Allergen Reactions  . Alendronate Sodium Nausea Only  . Levofloxacin Nausea And Vomiting  . Lipitor [Atorvastatin Calcium] Other (See Comments)    myalgias  . Lyrica [Pregabalin] Other (See Comments)    SORES IN MOUTH   . Meloxicam      Vaginal itching   . Sibutramine Hcl Monohydrate Other (See Comments)    Reaction unknown  . Actonel [Risedronate] Other (See Comments)    Bone pain and nausea   . Latex Rash and Other (See Comments)    RA to latex 1982; includes bandaids      Review of Systems         Review of Systems :  [ y ] = yes, [  ] = no        General :  Weight gain [   ]    Weight loss  [   ]  Fatigue [  ]  Fever [  ]  Chills  [  ]                                Weakness  [  ]           HEENT    Headache [  ]  Dizziness [  ]  Blurred vision [  ] Glaucoma  [  ]                          Nosebleeds [  ] Painful or loose teeth [  ]        Cardiac :  Chest pain/ pressure [  ]  Resting SOB [  ] exertional SOB [ mild ]                        Orthopnea [  ]  Pedal edema  [  ]  Palpitations [  ] Syncope/presyncope [ ]   Paroxysmal nocturnal dyspnea [  ]         Pulmonary : cough [  mild]  wheezing [  ]  Hemoptysis [  ] Sputum [  ] Snoring [  ]                              Pneumothorax [  ]  Sleep apnea [  ]        GI : Vomiting [  ]  Dysphagia [  ]  Melena  [  ]  Abdominal pain [  ] BRBPR [  ]              Heart burn [  ]  Constipation [place]  ] Diarrhea  [  ] Colonoscopy [   ]        GU : Hematuria [ yes recent kidney stone treatment ]  Dysuria [  ]  Nocturia [  ] UTI's [  ]        Vascular : Claudication [  ]  Rest pain [  ]  DVT [  ] Vein stripping [  ] leg ulcers [yes healed  ]                          TIA [  ] Stroke [  ]  Varicose veins [place]  ]        NEURO :  Headaches  [  ] Seizures [  ] Vision changes [  ] Paresthesias [  ]                                       Seizures [  ]        Musculoskeletal :  Arthritis [ yes ] Gout  [  ]  Back pain [  ]  Joint pain [  yes left shoulder pain]        Skin :  Rash [  ]  Melanoma [  ] Sores [  ]        Heme : Bleeding problems [  ]Clotting Disorders [  ] Anemia [  ]Blood Transfusion [ ]         Endocrine : Diabetes [  ] Heat or Cold  intolerance [  ] Polyuria [  ]excessive thirst [ ]         Psych : Depression [ yes ]  Anxiety [ yes ]  Psych hospitalizations [  ] Memory change [  ]                                               BP 132/65 (BP Location: Left Arm)   Pulse 60   Ht 5\' 4"  (1.626 m)   Wt 171 lb (77.6 kg)   SpO2 98%   BMI 29.35 kg/m  Physical Exam      Physical Exam  General: Short obese Caucasian female no acute distress HEENT: Normocephalic pupils equal , dentition adequate Neck: Supple without JVD, adenopathy, or bruit Chest: Clear to auscultation, symmetrical breath sounds, no rhonchi, no tenderness             or deformity Cardiovascular: Regular rate and rhythm,  no murmur, no gallop, peripheral pulses             palpable in all extremities Abdomen:  Soft, nontender, no palpable mass or organomegaly Extremities: Warm, well-perfused, no clubbing or  cyanosis. Mild edema without tenderness,              n2+ venous stasis changes of the legs Rectal/GU: Deferred Neuro: Grossly non--focal and symmetrical throughout Skin: Clean and dry without rash or ulceration   Diagnostic Tests: CTA images personally reviewed and discussed with patient and husband. The ascending thoracic aorta continues to enlarge and is now over 5.0 cm approaching 5.5. Official report by radiology is pending. At her body size this represents a significant risk for aortic dissection and replacement of the ascending aorta is recommended.  Impression: Patient will need left and right heart catheterization and a repeat echocardiogram then return for discussion of the surgery and to set a date.  Plan: Return after cardiac catheterization and echocardiogram.  Len Childs, MD [facility] [phone]

## 2017-05-24 NOTE — Progress Notes (Signed)
PCP is Chipper Herb, MD Referring Provider is Chipper Herb, MD  Chief Complaint  Patient presents with  . Follow-up    HPI: Returns with CTA of the thoracic aorta for follow-up of known fusiform ascending aneurysm. It is asymptomatic. Her ascending aorta has slowly increased in diameter over the past 2 years from 4.7 now up to 5.3, 5.4 cm by my measurement. Her blood pressure has been under good control. She is a nonsmoker. She was recently hospitalized for treatment of a right kidney stone which was removed through catheter therapy. A echocardiogram 2 and half years ago showed mild aortic insufficiency good LV function. She has had previous orthopedic surgery including left shoulder surgery, right knee replacement, and she has had partial thyroidectomy for cancer.   Past Medical History:  Diagnosis Date  . ACL tear    right  Dr. Gladstone Pih   . Adenomatous polyp   . Anxiety   . Aorta aneurysm ([place])   . Arthritis   . Ascending aortic aneurysm ([place])    note per chart per Dr Lucianne Lei Tright 4.7 cm 04/15/2015   . B12 deficiency   . Cancer ([place])   . Cataracts, both eyes 10/2006  . Chronic bronchitis ([place])   . Constipation   . Depression   . DUB (dysfunctional uterine bleeding) 10/96  . ETD (eustachian tube dysfunction)   . Fall   . Fibromyalgia   . GERD (gastroesophageal reflux disease)   . Glaucoma    ([place]) Dr. Arnoldo Morale   . Glaucoma    bilaterally  . Helicobacter pylori (H. pylori)   . Hiatal hernia   . History of bronchitis   . History of frequent urinary tract infections   . History of left shoulder fracture    pt states fell off bed and broke ball in shoulder had rod placed   . Hyperlipidemia   . Hypertension   . Insomnia   . Leg wound, left    healed   . MVP (mitral valve prolapse)   . Occasional tremors    left arm   . Osteoarthritis   . Osteopenia   . Osteoporosis   . Other and unspecified hyperlipidemia   . Post-menopausal   . Thyroid cancer ([place])   . Thyroid  nodule   . Tremor     Past Surgical History:  Procedure Laterality Date  . APPENDECTOMY    . BUNIONECTOMY  08/1999   right - Dr. Irving Shows   . CHOLECYSTECTOMY  1979  . DILATION AND CURETTAGE OF UTERUS  03/31/95   Dr. Ovid Curd   . EYE SURGERY     also had left cataract removed   . FRACTURE SURGERY    . NEPHROLITHOTOMY Left 04/07/2017   Procedure: NEPHROLITHOTOMY PERCUTANEOUS WITH SURGEON ACCESS;  Surgeon: Ardis Hughs, MD;  Location: [facility];  Service: Urology;  Laterality: Left;  . ORIF HUMERUS FRACTURE  05/30/2011   Procedure: OPEN REDUCTION INTERNAL FIXATION (ORIF) PROXIMAL HUMERUS FRACTURE;  Surgeon: Augustin Schooling;  Location: [facility];  Service: Orthopedics;  Laterality: Left;  open reduction internal fixation of proximal humerus fracture  . right knee replacement  3/11   Dr. Gladstone Pih  . rt. eye cataract  05/28/07  . THYROID LOBECTOMY Left 07/27/2015   Procedure: LEFT THYROID LOBECTOMY;  Surgeon: Fanny Skates, MD;  Location: [facility];  Service: General;  Laterality: Left;  . THYROIDECTOMY N/A 08/06/2015   Procedure: RIGHT THYROID LOBECTOMY, REIMPLANTATION PARATHYROID;  Surgeon: Fanny Skates, MD;  Location: [facility];  Service: General;  Laterality: N/A;  . VENTRAL HERNIA REPAIR  2/99    Family History  Problem Relation Age of Onset  . Cancer Mother        PANCREATIC   . Osteoporosis Mother   . Hip fracture Mother   . Heart attack Father 63       Died 68  . Heart disease Father   . Arthritis Father   . Hypertension Sister   . Cancer Sister        skin  . Hypertension Sister   . Breast cancer Neg Hx     Social History Social History   Tobacco Use  . Smoking status: Never Smoker  . Smokeless tobacco: Never Used  Substance Use Topics  . Alcohol use: No  . Drug use: No    Current Outpatient Medications  Medication Sig Dispense Refill  . acetaminophen (TYLENOL) 650 MG CR tablet Take 650 mg by mouth every 8 (eight) hours as needed for pain.    Marland Kitchen aspirin 81 MG tablet  Take 1 tablet (81 mg total) by mouth every other day. (Patient taking differently: Take 81 mg by mouth at bedtime. ) 30 tablet   . atenolol (TENORMIN) 50 MG tablet TAKE 1 TABLET DAILY 30 tablet 5  . Bisacodyl (CORRECTOL PO) Take 2 tablets by mouth every other day.    . calcium carbonate (OS-CAL - DOSED IN MG OF ELEMENTAL CALCIUM) 1250 (500 Ca) MG tablet Take 2 tablets (1,000 mg of elemental calcium total) by mouth 3 (three) times daily with meals. (Patient taking differently: Take 2 tablets by mouth 3 (three) times daily with meals. 5 a day) 50 tablet 2  . celecoxib (CELEBREX) 200 MG capsule TAKE (1) CAPSULE DAILY 30 capsule 0  . clobetasol cream (TEMOVATE) 2.44 % Apply 1 application topically 2 (two) times daily. (Patient taking differently: Apply 1 application topically daily as needed (rash). ) 30 g 2  . clorazepate (TRANXENE) 7.5 MG tablet TAKE 1 TABLET DAILY AS NEEDED (Patient taking differently: TAKE 1 TABLET DAILY AS NEEDED FOR ANXIETY) 30 tablet 1  . docusate sodium (COLACE) 100 MG capsule Take 2 capsules (200 mg total) by mouth 2 (two) times daily. 120 capsule 11  . famotidine (PEPCID) 20 MG tablet Take 1 tablet (20 mg total) by mouth 2 (two) times daily. 60 tablet 1  . fluticasone (FLONASE) 50 MCG/ACT nasal spray Place 2 sprays into both nostrils daily. 16 g 6  . gabapentin (NEURONTIN) 100 MG capsule Take 1-2 capsules QHS as directed 180 capsule 1  . levothyroxine (SYNTHROID, LEVOTHROID) 112 MCG tablet Take 1 tablet daily by mouth.  4  . magnesium citrate SOLN Take 1 Bottle by mouth once.    . mupirocin ointment (BACTROBAN) 2 % Apply 1 application topically 2 (two) times daily. (Patient taking differently: Apply 1 application topically 2 (two) times daily as needed (rash). ) 22 g 1  . nystatin (MYCOSTATIN/NYSTOP) powder Apply 1 g topically daily as needed (rash).   0  . Omega-3 Fatty Acids (FISH OIL) 600 MG CAPS Take 600 mg by mouth daily.    Marland Kitchen PROCTOZONE-HC 2.5 % rectal cream APPLY TO  RECTALLY 2 TIMES A DAY AS NEEDED (Patient taking differently: APPLY TO RECTALLY 1 TIME A DAY) 30 g 1  . ranitidine (ZANTAC) 150 MG tablet Take 150 mg by mouth 2 (two) times daily.    . simvastatin (ZOCOR) 80 MG tablet TAKE ONE TABLET AT BEDTIME (Patient taking differently: TAKE ONE HALF TABLET  AT BEDTIME) 30 tablet 5  . traMADol (ULTRAM) 50 MG tablet Take 1-2 tablets (50-100 mg total) by mouth every 6 (six) hours as needed for moderate pain. 20 tablet 0  . TRAVATAN Z 0.004 % SOLN ophthalmic solution INSTILL 1 DROP INTO RIGHT EYE AT BEDTIME (Patient taking differently: INSTILL 1 DROP INTO BOTH EYES AT BEDTIME) 2.5 mL 2  . triamcinolone cream (KENALOG) 0.1 % Apply 1 application topically 3 (three) times daily. Avoid face and genitalia 28.4 g 0  . venlafaxine XR (EFFEXOR-XR) 75 MG 24 hr capsule TAKE (1) CAPSULE DAILY (Patient taking differently: Take 75 mg by mouth daily with breakfast. TAKE (1) CAPSULE DAILY) 90 capsule 1  . Vitamin D, Ergocalciferol, (DRISDOL) 50000 units CAPS capsule Take 50,000 Units by mouth every 7 (seven) days. On Sundays    . zolpidem (AMBIEN) 10 MG tablet TAKE 1/2 TABLET AT BEDTIME AS NEEDED (Patient taking differently: TAKE 1/2 TABLET AT BEDTIME) 45 tablet 2   Current Facility-Administered Medications  Medication Dose Route Frequency Provider Last Rate Last Dose  . cyanocobalamin ((VITAMIN B-12)) injection 1,000 mcg  1,000 mcg Intramuscular Q30 days Chipper Herb, MD   1,000 mcg at 05/08/17 1546   Facility-Administered Medications Ordered in Other Visits  Medication Dose Route Frequency Provider Last Rate Last Dose  . iopamidol (ISOVUE-370) 76 % injection 75 mL  75 mL Intravenous Once PRN Ivin Poot, MD        Allergies  Allergen Reactions  . Alendronate Sodium Nausea Only  . Levofloxacin Nausea And Vomiting  . Lipitor [Atorvastatin Calcium] Other (See Comments)    myalgias  . Lyrica [Pregabalin] Other (See Comments)    SORES IN MOUTH   . Meloxicam      Vaginal itching   . Sibutramine Hcl Monohydrate Other (See Comments)    Reaction unknown  . Actonel [Risedronate] Other (See Comments)    Bone pain and nausea   . Latex Rash and Other (See Comments)    RA to latex 1982; includes bandaids      Review of Systems         Review of Systems :  [ y ] = yes, [  ] = no        General :  Weight gain [   ]    Weight loss  [   ]  Fatigue [  ]  Fever [  ]  Chills  [  ]                                Weakness  [  ]           HEENT    Headache [  ]  Dizziness [  ]  Blurred vision [  ] Glaucoma  [  ]                          Nosebleeds [  ] Painful or loose teeth [  ]        Cardiac :  Chest pain/ pressure [  ]  Resting SOB [  ] exertional SOB [ mild ]                        Orthopnea [  ]  Pedal edema  [  ]  Palpitations [  ] Syncope/presyncope [ ]   Paroxysmal nocturnal dyspnea [  ]         Pulmonary : cough [  mild]  wheezing [  ]  Hemoptysis [  ] Sputum [  ] Snoring [  ]                              Pneumothorax [  ]  Sleep apnea [  ]        GI : Vomiting [  ]  Dysphagia [  ]  Melena  [  ]  Abdominal pain [  ] BRBPR [  ]              Heart burn [  ]  Constipation [place]  ] Diarrhea  [  ] Colonoscopy [   ]        GU : Hematuria [ yes recent kidney stone treatment ]  Dysuria [  ]  Nocturia [  ] UTI's [  ]        Vascular : Claudication [  ]  Rest pain [  ]  DVT [  ] Vein stripping [  ] leg ulcers [yes healed  ]                          TIA [  ] Stroke [  ]  Varicose veins [place]  ]        NEURO :  Headaches  [  ] Seizures [  ] Vision changes [  ] Paresthesias [  ]                                       Seizures [  ]        Musculoskeletal :  Arthritis [ yes ] Gout  [  ]  Back pain [  ]  Joint pain [  yes left shoulder pain]        Skin :  Rash [  ]  Melanoma [  ] Sores [  ]        Heme : Bleeding problems [  ]Clotting Disorders [  ] Anemia [  ]Blood Transfusion [ ]         Endocrine : Diabetes [  ] Heat or Cold  intolerance [  ] Polyuria [  ]excessive thirst [ ]         Psych : Depression [ yes ]  Anxiety [ yes ]  Psych hospitalizations [  ] Memory change [  ]                                               BP 132/65 (BP Location: Left Arm)   Pulse 60   Ht 5\' 4"  (1.626 m)   Wt 171 lb (77.6 kg)   SpO2 98%   BMI 29.35 kg/m  Physical Exam      Physical Exam  General: Short obese Caucasian female no acute distress HEENT: Normocephalic pupils equal , dentition adequate Neck: Supple without JVD, adenopathy, or bruit Chest: Clear to auscultation, symmetrical breath sounds, no rhonchi, no tenderness             or deformity Cardiovascular: Regular rate and rhythm,  no murmur, no gallop, peripheral pulses             palpable in all extremities Abdomen:  Soft, nontender, no palpable mass or organomegaly Extremities: Warm, well-perfused, no clubbing or  cyanosis. Mild edema without tenderness,              n2+ venous stasis changes of the legs Rectal/GU: Deferred Neuro: Grossly non--focal and symmetrical throughout Skin: Clean and dry without rash or ulceration   Diagnostic Tests: CTA images personally reviewed and discussed with patient and husband. The ascending thoracic aorta continues to enlarge and is now over 5.0 cm approaching 5.5. Official report by radiology is pending. At her body size this represents a significant risk for aortic dissection and replacement of the ascending aorta is recommended.  Impression: Patient will need left and right heart catheterization and a repeat echocardiogram then return for discussion of the surgery and to set a date.  Plan: Return after cardiac catheterization and echocardiogram.  Len Childs, MD [facility] [phone]

## 2017-05-26 ENCOUNTER — Encounter: Payer: Medicare Other | Admitting: Physical Therapy

## 2017-05-30 ENCOUNTER — Telehealth (HOSPITAL_COMMUNITY): Payer: Self-pay | Admitting: *Deleted

## 2017-05-30 ENCOUNTER — Other Ambulatory Visit (HOSPITAL_COMMUNITY): Payer: Self-pay | Admitting: *Deleted

## 2017-05-30 ENCOUNTER — Encounter (HOSPITAL_COMMUNITY): Payer: Self-pay | Admitting: *Deleted

## 2017-05-30 DIAGNOSIS — I712 Thoracic aortic aneurysm, without rupture, unspecified: Secondary | ICD-10-CM

## 2017-05-30 NOTE — Telephone Encounter (Signed)
Pt wanted cath on 12/10, pt scheduled for 12/10 at 9 am, arrive at 7am, pt is aware, will leave instructions for her to p/u at the echo dept when she comes for echo 12/7

## 2017-05-30 NOTE — Telephone Encounter (Signed)
Received message from Dr Lucianne Lei Trigt's office, pt needs R/L heart cath ASAP as pre-op workup for surgery for her ascending thoracic aortic aneurysm.  Attempted to call pt to schedule and Left message to call back

## 2017-06-02 ENCOUNTER — Telehealth (HOSPITAL_COMMUNITY): Payer: Self-pay | Admitting: *Deleted

## 2017-06-02 ENCOUNTER — Ambulatory Visit (HOSPITAL_COMMUNITY)
Admission: RE | Admit: 2017-06-02 | Discharge: 2017-06-02 | Disposition: A | Discharge status: Home / Self Care | Payer: Medicare Other | Source: Ambulatory Visit | Attending: Cardiothoracic Surgery | Admitting: Cardiothoracic Surgery

## 2017-06-02 DIAGNOSIS — I712 Thoracic aortic aneurysm, without rupture, unspecified: Secondary | ICD-10-CM

## 2017-06-02 DIAGNOSIS — I351 Nonrheumatic aortic (valve) insufficiency: Secondary | ICD-10-CM | POA: Insufficient documentation

## 2017-06-02 NOTE — Telephone Encounter (Signed)
Called to confirm patient's cath for Monday 12/10, had to leave message asking for her to call us back.

## 2017-06-02 NOTE — Progress Notes (Signed)
  Echocardiogram 2D Echocardiogram has been performed.  [name] 06/02/2017, 1:57 PM

## 2017-06-05 ENCOUNTER — Ambulatory Visit (HOSPITAL_COMMUNITY): Admission: RE | Admit: 2017-06-05 | Payer: Medicare Other | Source: Ambulatory Visit | Admitting: Internal Medicine

## 2017-06-05 ENCOUNTER — Encounter (HOSPITAL_COMMUNITY): Admission: RE | Payer: Self-pay | Source: Ambulatory Visit

## 2017-06-05 SURGERY — RIGHT/LEFT HEART CATH AND CORONARY ANGIOGRAPHY
Anesthesia: LOCAL

## 2017-06-06 ENCOUNTER — Other Ambulatory Visit: Payer: Self-pay | Admitting: Family Medicine

## 2017-06-07 ENCOUNTER — Ambulatory Visit (INDEPENDENT_AMBULATORY_CARE_PROVIDER_SITE_OTHER): Payer: Medicare Other | Admitting: *Deleted

## 2017-06-07 DIAGNOSIS — E538 Deficiency of other specified B group vitamins: Secondary | ICD-10-CM | POA: Diagnosis not present

## 2017-06-07 DIAGNOSIS — C73 Malignant neoplasm of thyroid gland: Secondary | ICD-10-CM | POA: Diagnosis not present

## 2017-06-07 NOTE — Progress Notes (Signed)
Pt given Cyanocobalamin inj Tolerated well 

## 2017-06-07 NOTE — Telephone Encounter (Signed)
Pt's cath for 12/10 was cancelled due to weather, cath resch to 12/18 at 10:30 pt aware to arrive at 8:30, all instructions reviewed w/her via phone

## 2017-06-08 DIAGNOSIS — N200 Calculus of kidney: Secondary | ICD-10-CM | POA: Diagnosis not present

## 2017-06-09 NOTE — Telephone Encounter (Signed)
Confirmed with [place] this was called in

## 2017-06-13 ENCOUNTER — Encounter (HOSPITAL_COMMUNITY)
Admission: RE | Disposition: A | Discharge status: Home / Self Care | Payer: Self-pay | Source: Ambulatory Visit | Attending: Internal Medicine

## 2017-06-13 ENCOUNTER — Ambulatory Visit (HOSPITAL_COMMUNITY)
Admission: RE | Admit: 2017-06-13 | Discharge: 2017-06-13 | Disposition: A | Discharge status: Home / Self Care | Payer: Medicare Other | Source: Ambulatory Visit | Attending: Internal Medicine | Admitting: Internal Medicine

## 2017-06-13 DIAGNOSIS — G4700 Insomnia, unspecified: Secondary | ICD-10-CM | POA: Diagnosis not present

## 2017-06-13 DIAGNOSIS — I712 Thoracic aortic aneurysm, without rupture: Secondary | ICD-10-CM | POA: Insufficient documentation

## 2017-06-13 DIAGNOSIS — Z7982 Long term (current) use of aspirin: Secondary | ICD-10-CM | POA: Diagnosis not present

## 2017-06-13 DIAGNOSIS — K5900 Constipation, unspecified: Secondary | ICD-10-CM | POA: Insufficient documentation

## 2017-06-13 DIAGNOSIS — Z96651 Presence of right artificial knee joint: Secondary | ICD-10-CM | POA: Diagnosis not present

## 2017-06-13 DIAGNOSIS — Z91040 Latex allergy status: Secondary | ICD-10-CM | POA: Insufficient documentation

## 2017-06-13 DIAGNOSIS — I10 Essential (primary) hypertension: Secondary | ICD-10-CM | POA: Diagnosis not present

## 2017-06-13 DIAGNOSIS — H409 Unspecified glaucoma: Secondary | ICD-10-CM | POA: Insufficient documentation

## 2017-06-13 DIAGNOSIS — Z79890 Hormone replacement therapy (postmenopausal): Secondary | ICD-10-CM | POA: Insufficient documentation

## 2017-06-13 DIAGNOSIS — F329 Major depressive disorder, single episode, unspecified: Secondary | ICD-10-CM | POA: Insufficient documentation

## 2017-06-13 DIAGNOSIS — Z85850 Personal history of malignant neoplasm of thyroid: Secondary | ICD-10-CM | POA: Insufficient documentation

## 2017-06-13 DIAGNOSIS — F419 Anxiety disorder, unspecified: Secondary | ICD-10-CM | POA: Insufficient documentation

## 2017-06-13 DIAGNOSIS — I341 Nonrheumatic mitral (valve) prolapse: Secondary | ICD-10-CM | POA: Insufficient documentation

## 2017-06-13 DIAGNOSIS — E890 Postprocedural hypothyroidism: Secondary | ICD-10-CM | POA: Insufficient documentation

## 2017-06-13 DIAGNOSIS — M1990 Unspecified osteoarthritis, unspecified site: Secondary | ICD-10-CM | POA: Diagnosis not present

## 2017-06-13 DIAGNOSIS — Z79899 Other long term (current) drug therapy: Secondary | ICD-10-CM | POA: Diagnosis not present

## 2017-06-13 DIAGNOSIS — E785 Hyperlipidemia, unspecified: Secondary | ICD-10-CM | POA: Insufficient documentation

## 2017-06-13 DIAGNOSIS — M797 Fibromyalgia: Secondary | ICD-10-CM | POA: Diagnosis not present

## 2017-06-13 DIAGNOSIS — M810 Age-related osteoporosis without current pathological fracture: Secondary | ICD-10-CM | POA: Insufficient documentation

## 2017-06-13 DIAGNOSIS — K219 Gastro-esophageal reflux disease without esophagitis: Secondary | ICD-10-CM | POA: Insufficient documentation

## 2017-06-13 HISTORY — PX: RIGHT/LEFT HEART CATH AND CORONARY ANGIOGRAPHY: CATH118266

## 2017-06-13 LAB — BASIC METABOLIC PANEL
ANION GAP: 6 (ref 5–15)
BUN: 10 mg/dL (ref 6–20)
CHLORIDE: 106 mmol/L (ref 101–111)
CO2: 29 mmol/L (ref 22–32)
CREATININE: 0.65 mg/dL (ref 0.44–1.00)
Calcium: 8.4 mg/dL — ABNORMAL LOW (ref 8.9–10.3)
GFR calc non Af Amer: >60 mL/min (ref >60)
Glucose, Bld: 108 mg/dL — ABNORMAL HIGH (ref 65–99)
POTASSIUM: 3.8 mmol/L (ref 3.5–5.1)
SODIUM: 141 mmol/L (ref 135–145)

## 2017-06-13 LAB — POCT I-STAT 3, VENOUS BLOOD GAS (G3P V)
ACID-BASE DEFICIT: 5 mmol/L — AB (ref 0.0–2.0)
BICARBONATE: 20.7 mmol/L (ref 20.0–28.0)
BICARBONATE: 26.1 mmol/L (ref 20.0–28.0)
Bicarbonate: 26 mmol/L (ref 20.0–28.0)
O2 Saturation: 74 %
O2 Saturation: 75 %
O2 Saturation: 75 %
PCO2 VEN: 45.9 mmHg (ref 44.0–60.0)
PCO2 VEN: 46.4 mmHg (ref 44.0–60.0)
PH VEN: 7.331 (ref 7.250–7.430)
PH VEN: 7.357 (ref 7.250–7.430)
PH VEN: 7.364 (ref 7.250–7.430)
PO2 VEN: 43 mmHg (ref 32.0–45.0)
TCO2: 22 mmol/L (ref 22–32)
TCO2: 27 mmol/L (ref 22–32)
TCO2: 28 mmol/L (ref 22–32)
pCO2, Ven: 39.2 mmHg — ABNORMAL LOW (ref 44.0–60.0)
pO2, Ven: 41 mmHg (ref 32.0–45.0)
pO2, Ven: 42 mmHg (ref 32.0–45.0)

## 2017-06-13 LAB — CBC
HCT: 32.8 % — ABNORMAL LOW (ref 36.0–46.0)
HEMOGLOBIN: 10.4 g/dL — AB (ref 12.0–15.0)
MCH: 26.4 pg (ref 26.0–34.0)
MCHC: 31.7 g/dL (ref 30.0–36.0)
MCV: 83.2 fL (ref 78.0–100.0)
Platelets: 221 10*3/uL (ref 150–400)
RBC: 3.94 MIL/uL (ref 3.87–5.11)
RDW: 14 % (ref 11.5–15.5)
WBC: 4.3 10*3/uL (ref 4.0–10.5)

## 2017-06-13 LAB — POCT I-STAT 3, ART BLOOD GAS (G3+)
ACID-BASE EXCESS: 1 mmol/L (ref 0.0–2.0)
Bicarbonate: 25.7 mmol/L (ref 20.0–28.0)
O2 SAT: 99 %
PO2 ART: 131 mmHg — AB (ref 83.0–108.0)
TCO2: 27 mmol/L (ref 22–32)
pCO2 arterial: 42.8 mmHg (ref 32.0–48.0)
pH, Arterial: 7.387 (ref 7.350–7.450)

## 2017-06-13 LAB — PROTIME-INR
INR: 1.08
PROTHROMBIN TIME: 13.9 s (ref 11.4–15.2)

## 2017-06-13 SURGERY — RIGHT/LEFT HEART CATH AND CORONARY ANGIOGRAPHY
Anesthesia: LOCAL

## 2017-06-13 MED ORDER — ACETAMINOPHEN 325 MG PO TABS: 650.0000 mg | ORAL_TABLET | ORAL | Status: DC | PRN

## 2017-06-13 MED ORDER — MIDAZOLAM HCL 2 MG/2ML IJ SOLN
INTRAMUSCULAR | Status: DC | PRN
  Administered 2017-06-13 (×2): 1 mg via INTRAVENOUS

## 2017-06-13 MED ORDER — ASPIRIN 81 MG PO CHEW
81.0000 mg | CHEWABLE_TABLET | ORAL | Status: AC
  Administered 2017-06-13: 81 mg via ORAL

## 2017-06-13 MED ORDER — SODIUM CHLORIDE 0.9 % IV SOLN
INTRAVENOUS | Status: DC
  Administered 2017-06-13: 09:00:00 via INTRAVENOUS

## 2017-06-13 MED ORDER — MIDAZOLAM HCL 2 MG/2ML IJ SOLN
INTRAMUSCULAR | Status: AC
  Filled 2017-06-13: qty 2

## 2017-06-13 MED ORDER — SODIUM CHLORIDE 0.9 % IV SOLN: INTRAVENOUS | Status: AC

## 2017-06-13 MED ORDER — SODIUM CHLORIDE 0.9 % IV SOLN: 250.0000 mL | INTRAVENOUS | Status: DC | PRN

## 2017-06-13 MED ORDER — IOPAMIDOL (ISOVUE-370) INJECTION 76%
INTRAVENOUS | Status: AC
  Filled 2017-06-13: qty 100

## 2017-06-13 MED ORDER — HEPARIN (PORCINE) IN NACL 2-0.9 UNIT/ML-% IJ SOLN
INTRAMUSCULAR | Status: AC
  Filled 2017-06-13: qty 1000

## 2017-06-13 MED ORDER — LIDOCAINE HCL (PF) 1 % IJ SOLN
INTRAMUSCULAR | Status: DC | PRN
  Administered 2017-06-13 (×2): 2 mL via INTRADERMAL

## 2017-06-13 MED ORDER — SODIUM CHLORIDE 0.9% FLUSH
3.0000 mL | Freq: Two times a day (BID) | INTRAVENOUS | Status: DC
Start: 2017-06-13 — End: 2017-06-13

## 2017-06-13 MED ORDER — HEPARIN (PORCINE) IN NACL 2-0.9 UNIT/ML-% IJ SOLN
INTRAMUSCULAR | Status: AC | PRN
  Administered 2017-06-13: 1000 mL

## 2017-06-13 MED ORDER — HEPARIN SODIUM (PORCINE) 1000 UNIT/ML IJ SOLN
INTRAMUSCULAR | Status: AC
  Filled 2017-06-13: qty 1

## 2017-06-13 MED ORDER — SODIUM CHLORIDE 0.9% FLUSH: 3.0000 mL | Freq: Two times a day (BID) | INTRAVENOUS | Status: DC

## 2017-06-13 MED ORDER — FENTANYL CITRATE (PF) 100 MCG/2ML IJ SOLN
INTRAMUSCULAR | Status: AC
  Filled 2017-06-13: qty 2

## 2017-06-13 MED ORDER — LIDOCAINE HCL (PF) 1 % IJ SOLN
INTRAMUSCULAR | Status: AC
  Filled 2017-06-13: qty 30

## 2017-06-13 MED ORDER — FENTANYL CITRATE (PF) 100 MCG/2ML IJ SOLN
INTRAMUSCULAR | Status: DC | PRN
  Administered 2017-06-13: 25 ug via INTRAVENOUS

## 2017-06-13 MED ORDER — VERAPAMIL HCL 2.5 MG/ML IV SOLN
INTRAVENOUS | Status: DC | PRN
  Administered 2017-06-13: 10 mL via INTRA_ARTERIAL

## 2017-06-13 MED ORDER — HEPARIN SODIUM (PORCINE) 1000 UNIT/ML IJ SOLN
INTRAMUSCULAR | Status: DC | PRN
  Administered 2017-06-13: 3500 [IU] via INTRAVENOUS

## 2017-06-13 MED ORDER — ONDANSETRON HCL 4 MG/2ML IJ SOLN: 4.0000 mg | Freq: Four times a day (QID) | INTRAMUSCULAR | Status: DC | PRN

## 2017-06-13 MED ORDER — SODIUM CHLORIDE 0.9% FLUSH: 3.0000 mL | INTRAVENOUS | Status: DC | PRN

## 2017-06-13 MED ORDER — IOPAMIDOL (ISOVUE-370) INJECTION 76%
INTRAVENOUS | Status: DC | PRN
  Administered 2017-06-13: 130 mL via INTRA_ARTERIAL

## 2017-06-13 MED ORDER — ASPIRIN 81 MG PO CHEW
CHEWABLE_TABLET | ORAL | Status: AC
  Administered 2017-06-13: 81 mg via ORAL
  Filled 2017-06-13: qty 1

## 2017-06-13 MED ORDER — VERAPAMIL HCL 2.5 MG/ML IV SOLN
INTRAVENOUS | Status: AC
  Filled 2017-06-13: qty 2

## 2017-06-13 SURGICAL SUPPLY — 14 items
CATH 5FR JL3.5 JR4 ANG PIG MP (CATHETERS) ×1 IMPLANT
CATH BALLN WEDGE 5F 110CM (CATHETERS) ×1 IMPLANT
CATH LAUNCHER 5F 3DRIGHT (CATHETERS) IMPLANT
CATHETER LAUNCHER 5F 3DRIGHT (CATHETERS) ×2
DEVICE RAD COMP TR BAND LRG (VASCULAR PRODUCTS) ×1 IMPLANT
GLIDESHEATH SLEND SS 6F .021 (SHEATH) ×1 IMPLANT
GUIDEWIRE INQWIRE 1.5J.035X260 (WIRE) IMPLANT
INQWIRE 1.5J .035X260CM (WIRE) ×2
KIT HEART LEFT (KITS) ×2 IMPLANT
PACK CARDIAC CATHETERIZATION (CUSTOM PROCEDURE TRAY) ×2 IMPLANT
SHEATH GLIDE SLENDER 4/5FR (SHEATH) ×1 IMPLANT
TRANSDUCER W/STOPCOCK (MISCELLANEOUS) ×2 IMPLANT
TUBING CIL FLEX 10 FLL-RA (TUBING) ×2 IMPLANT
WIRE EMERALD 3MM-J .025X260CM (WIRE) ×1 IMPLANT

## 2017-06-13 NOTE — Interval H&P Note (Signed)
History and Physical Interval Note:  06/13/2017 11:14 AM  [name]  has presented today for surgery, with the diagnosis of hf  The various methods of treatment have been discussed with the patient and family. After consideration of risks, benefits and other options for treatment, the patient has consented to  Procedure(s): RIGHT/LEFT HEART CATH AND CORONARY ANGIOGRAPHY (N/A) as a surgical intervention .  The patient's history has been reviewed, patient examined, no change in status, stable for surgery.  I have reviewed the patient's chart and labs.  Questions were answered to the patient's satisfaction.     Daniel Bensimhon

## 2017-06-13 NOTE — Discharge Instructions (Signed)
Radial Site Care °Refer to this sheet in the next few weeks. These instructions provide you with information about caring for yourself after your procedure. Your health care provider may also give you more specific instructions. Your treatment has been planned according to current medical practices, but problems sometimes occur. Call your health care provider if you have any problems or questions after your procedure. °What can I expect after the procedure? °After your procedure, it is typical to have the following: °· Bruising at the radial site that usually fades within 1-2 weeks. °· Blood collecting in the tissue (hematoma) that may be painful to the touch. It should usually decrease in size and tenderness within 1-2 weeks. ° °Follow these instructions at home: °· Take medicines only as directed by your health care provider. °· You may shower 24-48 hours after the procedure or as directed by your health care provider. Remove the bandage (dressing) and gently wash the site with plain soap and water. Pat the area dry with a clean towel. Do not rub the site, because this may cause bleeding. °· Do not take baths, swim, or use a hot tub until your health care provider approves. °· Check your insertion site every day for redness, swelling, or drainage. °· Do not apply powder or lotion to the site. °· Do not flex or bend the affected arm for 24 hours or as directed by your health care provider. °· Do not push or pull heavy objects with the affected arm for 24 hours or as directed by your health care provider. °· Do not lift over 10 lb (4.5 kg) for 5 days after your procedure or as directed by your health care provider. °· Ask your health care provider when it is okay to: °? Return to work or school. °? Resume usual physical activities or sports. °? Resume sexual activity. °· Do not drive home if you are discharged the same day as the procedure. Have someone else drive you. °· You may drive 24 hours after the procedure  unless otherwise instructed by your health care provider. °· Do not operate machinery or power tools for 24 hours after the procedure. °· If your procedure was done as an outpatient procedure, which means that you went home the same day as your procedure, a responsible adult should be with you for the first 24 hours after you arrive home. °· Keep all follow-up visits as directed by your health care provider. This is important. °Contact a health care provider if: °· You have a fever. °· You have chills. °· You have increased bleeding from the radial site. Hold pressure on the site. °Get help right away if: °· You have unusual pain at the radial site. °· You have redness, warmth, or swelling at the radial site. °· You have drainage (other than a small amount of blood on the dressing) from the radial site. °· The radial site is bleeding, and the bleeding does not stop after 30 minutes of holding steady pressure on the site. °· Your arm or hand becomes pale, cool, tingly, or numb. °This information is not intended to replace advice given to you by your health care provider. Make sure you discuss any questions you have with your health care provider. °Document Released: 07/16/2010 Document Revised: 11/19/2015 Document Reviewed: 12/30/2013 °Elsevier Interactive Patient Education © 2018 [employer] ° ° ° °Moderate Conscious Sedation, Adult, Care After °These instructions provide you with information about caring for yourself after your procedure. Your health care provider   may also give you more specific instructions. Your treatment has been planned according to current medical practices, but problems sometimes occur. Call your health care provider if you have any problems or questions after your procedure. °What can I expect after the procedure? °After your procedure, it is common: °· To feel sleepy for several hours. °· To feel clumsy and have poor balance for several hours. °· To have poor judgment for several  hours. °· To vomit if you eat too soon. ° °Follow these instructions at home: °For at least 24 hours after the procedure: ° °· Do not: °? Participate in activities where you could fall or become injured. °? Drive. °? Use heavy machinery. °? Drink alcohol. °? Take sleeping pills or medicines that cause drowsiness. °? Make important decisions or sign legal documents. °? Take care of children on your own. °· Rest. °Eating and drinking °· Follow the diet recommended by your health care provider. °· If you vomit: °? Drink water, juice, or soup when you can drink without vomiting. °? Make sure you have little or no nausea before eating solid foods. °General instructions °· Have a responsible adult stay with you until you are awake and alert. °· Take over-the-counter and prescription medicines only as told by your health care provider. °· If you smoke, do not smoke without supervision. °· Keep all follow-up visits as told by your health care provider. This is important. °Contact a health care provider if: °· You keep feeling nauseous or you keep vomiting. °· You feel light-headed. °· You develop a rash. °· You have a fever. °Get help right away if: °· You have trouble breathing. °This information is not intended to replace advice given to you by your health care provider. Make sure you discuss any questions you have with your health care provider. °Document Released: 04/03/2013 Document Revised: 11/16/2015 Document Reviewed: 10/03/2015 °Elsevier Interactive Patient Education © 2018 [employer] ° °

## 2017-06-14 ENCOUNTER — Encounter (HOSPITAL_COMMUNITY): Payer: Self-pay | Admitting: Internal Medicine

## 2017-06-21 ENCOUNTER — Ambulatory Visit: Payer: Medicare Other | Admitting: Cardiothoracic Surgery

## 2017-06-23 ENCOUNTER — Encounter: Payer: Self-pay | Admitting: Cardiothoracic Surgery

## 2017-06-23 ENCOUNTER — Ambulatory Visit: Payer: Medicare Other | Admitting: Cardiothoracic Surgery

## 2017-06-23 ENCOUNTER — Other Ambulatory Visit: Payer: Self-pay

## 2017-06-23 VITALS — BP 142/71 | HR 64 | Resp 18 | Ht 64.0 in | Wt 171.4 lb

## 2017-06-23 DIAGNOSIS — I712 Thoracic aortic aneurysm, without rupture: Secondary | ICD-10-CM | POA: Diagnosis not present

## 2017-06-23 DIAGNOSIS — I7121 Aneurysm of the ascending aorta, without rupture: Secondary | ICD-10-CM

## 2017-06-24 ENCOUNTER — Encounter: Payer: Self-pay | Admitting: Cardiothoracic Surgery

## 2017-06-24 NOTE — Progress Notes (Signed)
PCP is Chipper Herb, MD Referring Provider is Chipper Herb, MD  Chief Complaint  Patient presents with  . Follow-up    cath 06/13/2017, echo 06/02/2017    HPI: Patient returns for discussion of a known 5.4 cm ascending fusiform aneurysm, asymptomatic. She recently underwent right and left heart catheterization which demonstrated no significant coronary artery disease with normal right heart pressures and cardiac output. An echocardiogram showed moderate aortic insufficiency of a tricuspid valve with the aortic root dilated. There is no mitral regurgitation. LV function was normal. The patient has no active dental problems and gets her teeth cleaned twice year. She is ready to have her surgery scheduled to include replacement of her ascending aorta with a heme shield graft and aortic valve replacement with a bioprosthetic valve. LV outflow tract is 20 mmHg. The patient has had previous general anesthesia without difficulties.  Past Medical History:  Diagnosis Date  . ACL tear    right  Dr. Gladstone Pih   . Adenomatous polyp   . Anxiety   . Aorta aneurysm ([place])   . Arthritis   . Ascending aortic aneurysm ([place])    note per chart per Dr Lucianne Lei Tright 4.7 cm 04/15/2015   . B12 deficiency   . Cancer ([place])   . Cataracts, both eyes 10/2006  . Chronic bronchitis ([place])   . Constipation   . Depression   . DUB (dysfunctional uterine bleeding) 10/96  . ETD (eustachian tube dysfunction)   . Fall   . Fibromyalgia   . GERD (gastroesophageal reflux disease)   . Glaucoma    ([place]) Dr. Arnoldo Morale   . Glaucoma    bilaterally  . Helicobacter pylori (H. pylori)   . Hiatal hernia   . History of bronchitis   . History of frequent urinary tract infections   . History of left shoulder fracture    pt states fell off bed and broke ball in shoulder had rod placed   . Hyperlipidemia   . Hypertension   . Insomnia   . Leg wound, left    healed   . MVP (mitral valve prolapse)   . Occasional tremors    left arm   . Osteoarthritis   . Osteopenia   . Osteoporosis   . Other and unspecified hyperlipidemia   . Post-menopausal   . Thyroid cancer ([place])   . Thyroid nodule   . Tremor     Past Surgical History:  Procedure Laterality Date  . APPENDECTOMY    . BUNIONECTOMY  08/1999   right - Dr. Irving Shows   . CHOLECYSTECTOMY  1979  . DILATION AND CURETTAGE OF UTERUS  03/31/95   Dr. Ovid Curd   . EYE SURGERY     also had left cataract removed   . FRACTURE SURGERY    . NEPHROLITHOTOMY Left 04/07/2017   Procedure: NEPHROLITHOTOMY PERCUTANEOUS WITH SURGEON ACCESS;  Surgeon: Ardis Hughs, MD;  Location: [facility];  Service: Urology;  Laterality: Left;  . ORIF HUMERUS FRACTURE  05/30/2011   Procedure: OPEN REDUCTION INTERNAL FIXATION (ORIF) PROXIMAL HUMERUS FRACTURE;  Surgeon: Augustin Schooling;  Location: [facility];  Service: Orthopedics;  Laterality: Left;  open reduction internal fixation of proximal humerus fracture  . right knee replacement  3/11   Dr. Gladstone Pih  . RIGHT/LEFT HEART CATH AND CORONARY ANGIOGRAPHY N/A 06/13/2017   Procedure: RIGHT/LEFT HEART CATH AND CORONARY ANGIOGRAPHY;  Surgeon: Jolaine Artist, MD;  Location: [facility];  Service: Cardiovascular;  Laterality: N/A;  .  rt. eye cataract  05/28/07  . THYROID LOBECTOMY Left 07/27/2015   Procedure: LEFT THYROID LOBECTOMY;  Surgeon: Fanny Skates, MD;  Location: [facility];  Service: General;  Laterality: Left;  . THYROIDECTOMY N/A 08/06/2015   Procedure: RIGHT THYROID LOBECTOMY, REIMPLANTATION PARATHYROID;  Surgeon: Fanny Skates, MD;  Location: [facility];  Service: General;  Laterality: N/A;  . VENTRAL HERNIA REPAIR  2/99    Family History  Problem Relation Age of Onset  . Cancer Mother        PANCREATIC   . Osteoporosis Mother   . Hip fracture Mother   . Heart attack Father 63       Died 68  . Heart disease Father   . Arthritis Father   . Hypertension Sister   . Cancer Sister        skin  . Hypertension Sister   . Breast  cancer Neg Hx     Social History Social History   Tobacco Use  . Smoking status: Never Smoker  . Smokeless tobacco: Never Used  Substance Use Topics  . Alcohol use: No  . Drug use: No    Current Outpatient Medications  Medication Sig Dispense Refill  . acetaminophen (TYLENOL) 650 MG CR tablet Take 650 mg by mouth every 8 (eight) hours as needed for pain.    Marland Kitchen aspirin 81 MG tablet Take 1 tablet (81 mg total) by mouth every other day. (Patient taking differently: Take 81 mg by mouth at bedtime. ) 30 tablet   . atenolol (TENORMIN) 50 MG tablet TAKE 1 TABLET DAILY (Patient taking differently: TAKE 1 TABLET DAILY AT BEDTIME.) 30 tablet 5  . bisacodyl (CORRECTOL) 5 MG EC tablet Take 10 mg by mouth every other day.    . celecoxib (CELEBREX) 200 MG capsule TAKE (1) CAPSULE DAILY 30 capsule 0  . clobetasol cream (TEMOVATE) 8.65 % Apply 1 application topically 2 (two) times daily. 30 g 2  . clorazepate (TRANXENE) 7.5 MG tablet TAKE 1 TABLET DAILY AS NEEDED 30 tablet 2  . docusate sodium (COLACE) 100 MG capsule Take 2 capsules (200 mg total) by mouth 2 (two) times daily. 120 capsule 11  . famotidine (PEPCID) 20 MG tablet Take 1 tablet (20 mg total) by mouth 2 (two) times daily. 60 tablet 1  . fluticasone (FLONASE) 50 MCG/ACT nasal spray Place 2 sprays into both nostrils daily. (Patient taking differently: Place 2 sprays into both nostrils daily as needed (for allergies.). ) 16 g 6  . levothyroxine (SYNTHROID, LEVOTHROID) 112 MCG tablet Take 112 mcg by mouth daily before breakfast.   4  . mupirocin ointment (BACTROBAN) 2 % Apply 1 application topically 2 (two) times daily. 22 g 1  . Omega 3 1200 MG CAPS Take 1,200 mg by mouth at bedtime.    . polyethylene glycol (MIRALAX / GLYCOLAX) packet Take 17 g by mouth daily as needed (for constipation with ORANGE JUICE.).    Marland Kitchen PROCTOZONE-HC 2.5 % rectal cream APPLY TO RECTALLY 2 TIMES A DAY AS NEEDED (Patient taking differently: APPLY TO RECTALLY 2 TIMES A  DAY AS NEEDED FOR DISCOMFORT) 30 g 1  . ranitidine (ZANTAC) 150 MG tablet Take 150 mg by mouth 2 (two) times daily as needed (FOR ACID REFLUX/INDIGESTION.).     Marland Kitchen senna (SENOKOT) 8.6 MG TABS tablet Take 1-2 tablets by mouth daily as needed for mild constipation.    . simvastatin (ZOCOR) 80 MG tablet TAKE ONE TABLET AT BEDTIME (Patient taking differently: TAKE ONE HALF (40  MG) TABLET AT BEDTIME) 30 tablet 5  . traMADol (ULTRAM) 50 MG tablet Take 1-2 tablets (50-100 mg total) by mouth every 6 (six) hours as needed for moderate pain. 20 tablet 0  . TRAVATAN Z 0.004 % SOLN ophthalmic solution INSTILL 1 DROP INTO RIGHT EYE AT BEDTIME (Patient taking differently: INSTILL 1 DROP INTO BOTH EYES AT BEDTIME) 2.5 mL 2  . triamcinolone cream (KENALOG) 0.1 % Apply 1 application topically 3 (three) times daily. Avoid face and genitalia 28.4 g 0  . venlafaxine XR (EFFEXOR-XR) 75 MG 24 hr capsule TAKE (1) CAPSULE DAILY 90 capsule 0  . Vitamin D, Ergocalciferol, (DRISDOL) 50000 units CAPS capsule Take 50,000 Units by mouth every Sunday.     . zolpidem (AMBIEN) 10 MG tablet TAKE 1/2 TABLET AT BEDTIME AS NEEDED (Patient taking differently: TAKE 0.5 TABLET (5 MG) BY MOUTH AT BEDTIME.) 45 tablet 2   Current Facility-Administered Medications  Medication Dose Route Frequency Provider Last Rate Last Dose  . cyanocobalamin ((VITAMIN B-12)) injection 1,000 mcg  1,000 mcg Intramuscular Q30 days Chipper Herb, MD   1,000 mcg at 06/07/17 1004    Allergies  Allergen Reactions  . Alendronate Sodium Nausea Only  . Levofloxacin Nausea And Vomiting  . Lipitor [Atorvastatin Calcium] Other (See Comments)    myalgias  . Lyrica [Pregabalin] Other (See Comments)    SORES IN MOUTH   . Meloxicam     Vaginal itching   . Sibutramine Hcl Monohydrate Other (See Comments)    Reaction unknown  . Actonel [Risedronate] Other (See Comments)    Bone pain and nausea   . Latex Rash and Other (See Comments)    RA to latex 1982;  includes bandaids      Review of Systems        Review of Systems :  [ y ] = yes, [  ] = no        General :  Weight gain [   ]    Weight loss  [   ]  Fatigue [  ]  Fever [  ]  Chills  [  ]                                Weakness  [  ]           HEENT    Headache [  ]  Dizziness [  ]  Blurred vision [  ] Glaucoma  [  ]                          Nosebleeds [  ] Painful or loose teeth [  ]        Cardiac :  Chest pain/ pressure [  ]  Resting SOB [  ] exertional SOB [  ]                        Orthopnea [  ]  Pedal edema  [  ]  Palpitations [  ] Syncope/presyncope [ ]                         Paroxysmal nocturnal dyspnea [  ]         Pulmonary : cough [  ]  wheezing [  ]  Hemoptysis [  ] Sputum [  ] Snoring [  ]  Pneumothorax [  ]  Sleep apnea [  ]        GI : Vomiting [  ]  Dysphagia [  ]  Melena  [  ]  Abdominal pain [  ] BRBPR [  ]              Heart burn [  ]  Constipation [  ] Diarrhea  [  ] Colonoscopy [   ]        GU : Hematuria [recent kidney stone treatment  ]  Dysuria [  ]  Nocturia [  ] UTI's [  ]        Vascular : Claudication [  ]  Rest pain [  ]  DVT [  ] Vein stripping [  ] leg ulcers [yes recently healed  ]                          TIA [  ] Stroke [  ]  Varicose veins [  ]        NEURO :  Headaches  [  ] Seizures [  ] Vision changes [  ] Paresthesias [  ]                                       Seizures [  ]        Musculoskeletal :  Arthritis [  ] Gout  [  ]  Back pain [  ]  Joint pain [left shoulder pain  ]        Skin :  Rash [  ]  Melanoma [  ] Sores [  ]        Heme : Bleeding problems [  ]Clotting Disorders [  ] Anemia [  ]Blood Transfusion [ ]         Endocrine : Diabetes [  ] Heat or Cold intolerance [  ] Polyuria [  ]excessive thirst [ ]         Psych : Depression [ yes ]  Anxiety [place]  ]  Psych hospitalizations [  ] Memory change [  ]                                                BP (!) 142/71 (BP Location: Right Arm,  Patient Position: Sitting, Cuff Size: Normal)   Pulse 64   Resp 18   Ht 5\' 4"  (1.626 m)   Wt 171 lb 6.4 oz (77.7 kg)   SpO2 98% Comment: on RA  BMI 29.42 kg/m  Physical Exam        Exam    General- alert and comfortable    Neck-no JVD, no adenopathy, good carotid pulses bilaterally, no bruit   Lungs- clear without rales, wheezes   Cor- regular rate and rhythm, 2/6 diastolic murmur of aortic insufficiency  , no gallop   Abdomen- soft, non-tender   Extremities - warm, non-tender, minimal edema   Neuro- oriented, appropriate, no focal weakness  Diagnostic Tests:  coronary angiogram images, echocardiogram images and right heart cath data personally reviewed and counseled the patient   Impression: Large asymmetric fusiform ascending aneurysm represents risk for dissection. She has moderate aortic insufficiency.  Plan:  plan replacement ascending aorta and aortic valve replacement with bioprosthetic valve. Surgery will be scheduled for early January. I discussed the details of surgery with the patient and husband including indications benefits and risks. They understand and agree to proceed with surgery.   Len Childs, MD [facility] [phone]

## 2017-06-26 ENCOUNTER — Other Ambulatory Visit: Payer: Self-pay | Admitting: *Deleted

## 2017-06-26 DIAGNOSIS — I351 Nonrheumatic aortic (valve) insufficiency: Secondary | ICD-10-CM

## 2017-06-26 DIAGNOSIS — I712 Thoracic aortic aneurysm, without rupture: Secondary | ICD-10-CM

## 2017-06-26 DIAGNOSIS — I7121 Aneurysm of the ascending aorta, without rupture: Secondary | ICD-10-CM

## 2017-06-27 DIAGNOSIS — Z9289 Personal history of other medical treatment: Secondary | ICD-10-CM

## 2017-06-27 HISTORY — DX: Personal history of other medical treatment: Z92.89

## 2017-07-05 NOTE — Pre-Procedure Instructions (Signed)
[name]  07/05/2017      [address], [place] [address] Phone: [phone] Fax: [phone]    Your procedure is scheduled on Jan 14  Report to Register at 530 A.M.  Call this number if you have problems the morning of surgery:  [phone]   Remember:  Do not eat food or drink liquids after midnight.  Take these medicines the morning of surgery with A SIP OF WATER Atenolol (Tenormin), Clorazepate (Tranxene), Gabapentin (Neurontin), Levothyroxine (Synthroid), Venlafaxine XR (Effexor-Xr)  Stop taking aspirin as directed by your Dr.    [name] not wear jewelry, make-up or nail polish.  Do not wear lotions, powders, or perfumes, or deodorant.  Do not shave 48 hours prior to surgery.  Men may shave face and neck.  Do not bring valuables to the hospital.  [facility] is not responsible for any belongings or valuables.  Contacts, dentures or bridgework may not be worn into surgery.  Leave your suitcase in the car.  After surgery it may be brought to your room.  For patients admitted to the hospital, discharge time will be determined by your treatment team.  Patients discharged the day of surgery will not be allowed to drive home.  Special instructions:  [facility] - Preparing for Surgery  Before surgery, you can play an important role.  Because skin is not sterile, your skin needs to be as free of germs as possible.  You can reduce the number of germs on you skin by washing with CHG (chlorahexidine gluconate) soap before surgery.  CHG is an antiseptic cleaner which kills germs and bonds with the skin to continue killing germs even after washing.  Please DO NOT use if you have an allergy to CHG or antibacterial soaps.  If your skin becomes reddened/irritated stop using the CHG and inform your nurse when you arrive at Short Stay.  Do not shave (including legs and underarms) for at least 48  hours prior to the first CHG shower.  You may shave your face.  Please follow these instructions carefully:   1.  Shower with CHG Soap the night before surgery and the   morning of Surgery.  2.  If you choose to wash your hair, wash your hair first as usual with your  normal shampoo.  3.  After you shampoo, rinse your hair and body thoroughly to remove the  Shampoo.  4.  Use CHG as you would any other liquid soap.  You can apply chg directly   to the skin and wash gently with scrungie or a clean washcloth.  5.  Apply the CHG Soap to your body ONLY FROM THE NECK DOWN.    Do not use on open wounds or open sores.  Avoid contact with your eyes, ears, mouth and genitals (private parts).  Wash genitals (private parts)       with your normal soap.  6.  Wash thoroughly, paying special attention to the area where your surgery  will be performed.  7.  Thoroughly rinse your body with warm water from the neck down.  8.  DO NOT shower/wash with your normal soap after using and rinsing off the CHG Soap.  9.  Pat yourself dry with a clean towel.            10.  Wear clean pajamas.  11.  Place clean sheets on your bed the night of your first shower and do not sleep with pets.  Day of Surgery  Do not apply any lotions/deoderants the morning of surgery.  Please wear clean clothes to the hospital/surgery center.     Please read over the following fact sheets that you were given. Pain Booklet, Coughing and Deep Breathing, MRSA Information and Surgical Site Infection Prevention

## 2017-07-06 ENCOUNTER — Ambulatory Visit (HOSPITAL_COMMUNITY)
Admission: RE | Admit: 2017-07-06 | Discharge: 2017-07-06 | Disposition: A | Discharge status: Home / Self Care | Payer: Medicare Other | Source: Ambulatory Visit | Attending: Cardiothoracic Surgery | Admitting: Cardiothoracic Surgery

## 2017-07-06 ENCOUNTER — Other Ambulatory Visit: Payer: Self-pay

## 2017-07-06 ENCOUNTER — Encounter (HOSPITAL_COMMUNITY): Payer: Self-pay

## 2017-07-06 ENCOUNTER — Encounter (HOSPITAL_COMMUNITY)
Admission: RE | Admit: 2017-07-06 | Discharge: 2017-07-06 | Disposition: A | Discharge status: Home / Self Care | Payer: Medicare Other | Source: Ambulatory Visit | Attending: Cardiothoracic Surgery | Admitting: Cardiothoracic Surgery

## 2017-07-06 DIAGNOSIS — E041 Nontoxic single thyroid nodule: Secondary | ICD-10-CM | POA: Diagnosis not present

## 2017-07-06 DIAGNOSIS — Z9181 History of falling: Secondary | ICD-10-CM | POA: Diagnosis not present

## 2017-07-06 DIAGNOSIS — I6523 Occlusion and stenosis of bilateral carotid arteries: Secondary | ICD-10-CM | POA: Insufficient documentation

## 2017-07-06 DIAGNOSIS — I7121 Aneurysm of the ascending aorta, without rupture: Secondary | ICD-10-CM

## 2017-07-06 DIAGNOSIS — Z01818 Encounter for other preprocedural examination: Secondary | ICD-10-CM | POA: Insufficient documentation

## 2017-07-06 DIAGNOSIS — I712 Thoracic aortic aneurysm, without rupture: Secondary | ICD-10-CM

## 2017-07-06 DIAGNOSIS — I351 Nonrheumatic aortic (valve) insufficiency: Secondary | ICD-10-CM | POA: Diagnosis not present

## 2017-07-06 DIAGNOSIS — I00 Rheumatic fever without heart involvement: Secondary | ICD-10-CM | POA: Diagnosis not present

## 2017-07-06 DIAGNOSIS — E785 Hyperlipidemia, unspecified: Secondary | ICD-10-CM | POA: Insufficient documentation

## 2017-07-06 DIAGNOSIS — K219 Gastro-esophageal reflux disease without esophagitis: Secondary | ICD-10-CM | POA: Insufficient documentation

## 2017-07-06 DIAGNOSIS — H409 Unspecified glaucoma: Secondary | ICD-10-CM | POA: Insufficient documentation

## 2017-07-06 DIAGNOSIS — H269 Unspecified cataract: Secondary | ICD-10-CM | POA: Insufficient documentation

## 2017-07-06 DIAGNOSIS — E538 Deficiency of other specified B group vitamins: Secondary | ICD-10-CM | POA: Diagnosis not present

## 2017-07-06 DIAGNOSIS — I059 Rheumatic mitral valve disease, unspecified: Secondary | ICD-10-CM | POA: Diagnosis not present

## 2017-07-06 DIAGNOSIS — N200 Calculus of kidney: Secondary | ICD-10-CM | POA: Insufficient documentation

## 2017-07-06 DIAGNOSIS — F329 Major depressive disorder, single episode, unspecified: Secondary | ICD-10-CM | POA: Diagnosis not present

## 2017-07-06 DIAGNOSIS — Z01812 Encounter for preprocedural laboratory examination: Secondary | ICD-10-CM | POA: Diagnosis present

## 2017-07-06 DIAGNOSIS — Z01810 Encounter for preprocedural cardiovascular examination: Secondary | ICD-10-CM | POA: Diagnosis present

## 2017-07-06 HISTORY — DX: Hypothyroidism, unspecified: E03.9

## 2017-07-06 HISTORY — DX: Personal history of urinary calculi: Z87.442

## 2017-07-06 LAB — CBC
HCT: 37.1 % (ref 36.0–46.0)
Hemoglobin: 11.1 g/dL — ABNORMAL LOW (ref 12.0–15.0)
MCH: 24.7 pg — ABNORMAL LOW (ref 26.0–34.0)
MCHC: 29.9 g/dL — ABNORMAL LOW (ref 30.0–36.0)
MCV: 82.6 fL (ref 78.0–100.0)
Platelets: 265 10*3/uL (ref 150–400)
RBC: 4.49 MIL/uL (ref 3.87–5.11)
RDW: 14.8 % (ref 11.5–15.5)
WBC: 5.4 10*3/uL (ref 4.0–10.5)

## 2017-07-06 LAB — PULMONARY FUNCTION TEST
DL/VA % pred: 90 %
DL/VA: 4.35 ml/min/mmHg/L
DLCO unc % pred: 75 %
DLCO unc: 18.28 ml/min/mmHg
FEF 25-75 Post: 2.71 L/sec
FEF 25-75 Pre: 1.98 L/sec
FEF2575-%Change-Post: 36 %
FEF2575-%Pred-Post: 163 %
FEF2575-%Pred-Pre: 119 %
FEV1-%Change-Post: 5 %
FEV1-%Pred-Post: 102 %
FEV1-%Pred-Pre: 96 %
FEV1-Post: 2.15 L
FEV1-Pre: 2.03 L
FEV1FVC-%Change-Post: 2 %
FEV1FVC-%Pred-Pre: 108 %
FEV6-%Change-Post: 3 %
FEV6-%Pred-Post: 96 %
FEV6-%Pred-Pre: 93 %
FEV6-Post: 2.59 L
FEV6-Pre: 2.51 L
FEV6FVC-%Pred-Post: 105 %
FEV6FVC-%Pred-Pre: 105 %
FVC-%Change-Post: 3 %
FVC-%Pred-Post: 92 %
FVC-%Pred-Pre: 89 %
FVC-Post: 2.59 L
FVC-Pre: 2.51 L
Post FEV1/FVC ratio: 83 %
Post FEV6/FVC ratio: 100 %
Pre FEV1/FVC ratio: 81 %
Pre FEV6/FVC Ratio: 100 %
RV % pred: 122 %
RV: 2.8 L
TLC % pred: 107 %
TLC: 5.42 L

## 2017-07-06 LAB — COMPREHENSIVE METABOLIC PANEL
ALT: 13 U/L — ABNORMAL LOW (ref 14–54)
AST: 25 U/L (ref 15–41)
Albumin: 4.3 g/dL (ref 3.5–5.0)
Alkaline Phosphatase: 92 U/L (ref 38–126)
Anion gap: 14 (ref 5–15)
BUN: 11 mg/dL (ref 6–20)
CO2: 23 mmol/L (ref 22–32)
Calcium: 8.5 mg/dL — ABNORMAL LOW (ref 8.9–10.3)
Chloride: 101 mmol/L (ref 101–111)
Creatinine, Ser: 0.56 mg/dL (ref 0.44–1.00)
GFR calc Af Amer: >60 mL/min (ref >60)
GFR calc non Af Amer: >60 mL/min (ref >60)
Glucose, Bld: 104 mg/dL — ABNORMAL HIGH (ref 65–99)
Potassium: 3.7 mmol/L (ref 3.5–5.1)
Sodium: 138 mmol/L (ref 135–145)
Total Bilirubin: 0.9 mg/dL (ref 0.3–1.2)
Total Protein: 6.8 g/dL (ref 6.5–8.1)

## 2017-07-06 LAB — BLOOD GAS, ARTERIAL
Acid-Base Excess: 1.8 mmol/L (ref 0.0–2.0)
Bicarbonate: 25.4 mmol/L (ref 20.0–28.0)
Drawn by: 421801
FIO2: 21
O2 Saturation: 98.1 %
Patient temperature: 98.6
pCO2 arterial: 36.3 mmHg (ref 32.0–48.0)
pH, Arterial: 7.459 — ABNORMAL HIGH (ref 7.350–7.450)
pO2, Arterial: 107 mmHg (ref 83.0–108.0)

## 2017-07-06 LAB — URINALYSIS, ROUTINE W REFLEX MICROSCOPIC
Bilirubin Urine: NEGATIVE
Glucose, UA: NEGATIVE mg/dL
Hgb urine dipstick: NEGATIVE
Ketones, ur: NEGATIVE mg/dL
Leukocytes, UA: NEGATIVE
Nitrite: NEGATIVE
Protein, ur: NEGATIVE mg/dL
Specific Gravity, Urine: 1.004 — ABNORMAL LOW (ref 1.005–1.030)
pH: 6 (ref 5.0–8.0)

## 2017-07-06 LAB — SURGICAL PCR SCREEN
MRSA, PCR: NEGATIVE
Staphylococcus aureus: NEGATIVE

## 2017-07-06 LAB — HEMOGLOBIN A1C
Hgb A1c MFr Bld: 5.2 % (ref 4.8–5.6)
Mean Plasma Glucose: 102.54 mg/dL

## 2017-07-06 LAB — APTT: aPTT: 30 seconds (ref 24–36)

## 2017-07-06 LAB — PROTIME-INR
INR: 1.03
Prothrombin Time: 13.4 seconds (ref 11.4–15.2)

## 2017-07-06 LAB — ABO/RH: ABO/RH(D): A POS

## 2017-07-06 MED ORDER — ALBUTEROL SULFATE (2.5 MG/3ML) 0.083% IN NEBU
2.5000 mg | INHALATION_SOLUTION | Freq: Once | RESPIRATORY_TRACT | Status: AC
  Administered 2017-07-06: 2.5 mg via RESPIRATORY_TRACT

## 2017-07-06 NOTE — Progress Notes (Signed)
Pre-CABG testing has been completed. 1-39% ICA stenosis bilaterally.  07/06/17 2:38 PM [name] RVT

## 2017-07-06 NOTE — Progress Notes (Signed)
PCP is Dr. Redge Gainer Cardiologist is Dr Haroldine Laws Denies chest pain, fever,or cough. Card cath noted 06-13-17 Stress test 05-22-13 Echo 06-02-17

## 2017-07-09 MED ORDER — MAGNESIUM SULFATE 50 % IJ SOLN
40.0000 meq | INTRAMUSCULAR | Status: DC
  Filled 2017-07-09: qty 9.85

## 2017-07-09 MED ORDER — TRANEXAMIC ACID 1000 MG/10ML IV SOLN
1.5000 mg/kg/h | INTRAVENOUS | Status: AC
  Administered 2017-07-10: 1.5 mg/kg/h via INTRAVENOUS
  Filled 2017-07-09: qty 25

## 2017-07-09 MED ORDER — POTASSIUM CHLORIDE 2 MEQ/ML IV SOLN
80.0000 meq | INTRAVENOUS | Status: DC
  Filled 2017-07-09: qty 40

## 2017-07-09 MED ORDER — EPINEPHRINE PF 1 MG/ML IJ SOLN
0.0000 ug/min | INTRAVENOUS | Status: DC
  Filled 2017-07-09: qty 4

## 2017-07-09 MED ORDER — DEXTROSE 5 % IV SOLN
750.0000 mg | INTRAVENOUS | Status: DC
  Filled 2017-07-09: qty 750

## 2017-07-09 MED ORDER — TRANEXAMIC ACID (OHS) PUMP PRIME SOLUTION
2.0000 mg/kg | INTRAVENOUS | Status: DC
  Filled 2017-07-09: qty 1.53

## 2017-07-09 MED ORDER — METOPROLOL TARTRATE 12.5 MG HALF TABLET: 12.5000 mg | ORAL_TABLET | Freq: Once | ORAL | Status: DC

## 2017-07-09 MED ORDER — TRANEXAMIC ACID (OHS) BOLUS VIA INFUSION
15.0000 mg/kg | INTRAVENOUS | Status: AC
  Administered 2017-07-10: 1147.5 mg via INTRAVENOUS
  Filled 2017-07-09: qty 1148

## 2017-07-09 MED ORDER — VANCOMYCIN HCL 10 G IV SOLR
1250.0000 mg | INTRAVENOUS | Status: AC
  Administered 2017-07-10: 1250 mg via INTRAVENOUS
  Filled 2017-07-09: qty 1250

## 2017-07-09 MED ORDER — SODIUM CHLORIDE 0.9 % IV SOLN
INTRAVENOUS | Status: DC
  Filled 2017-07-09: qty 30

## 2017-07-09 MED ORDER — NITROGLYCERIN IN D5W 200-5 MCG/ML-% IV SOLN
2.0000 ug/min | INTRAVENOUS | Status: AC
  Administered 2017-07-10: 10 ug/min via INTRAVENOUS
  Filled 2017-07-09: qty 250

## 2017-07-09 MED ORDER — SODIUM CHLORIDE 0.9 % IV SOLN
30.0000 ug/min | INTRAVENOUS | Status: AC
  Administered 2017-07-10: 25 ug/min via INTRAVENOUS
  Filled 2017-07-09: qty 2

## 2017-07-09 MED ORDER — DOPAMINE-DEXTROSE 3.2-5 MG/ML-% IV SOLN
0.0000 ug/kg/min | INTRAVENOUS | Status: DC
  Filled 2017-07-09: qty 250

## 2017-07-09 MED ORDER — DEXTROSE 5 % IV SOLN
1.5000 g | INTRAVENOUS | Status: AC
  Administered 2017-07-10: 1.5 g via INTRAVENOUS
  Administered 2017-07-10: .75 g via INTRAVENOUS
  Filled 2017-07-09: qty 1.5

## 2017-07-09 MED ORDER — DEXMEDETOMIDINE HCL IN NACL 400 MCG/100ML IV SOLN
0.1000 ug/kg/h | INTRAVENOUS | Status: AC
  Administered 2017-07-10: .5 ug/kg/h via INTRAVENOUS
  Filled 2017-07-09: qty 100

## 2017-07-09 MED ORDER — PLASMA-LYTE 148 IV SOLN
INTRAVENOUS | Status: AC
  Administered 2017-07-10: 500 mL
  Filled 2017-07-09: qty 2.5

## 2017-07-09 MED ORDER — SODIUM CHLORIDE 0.9 % IV SOLN
INTRAVENOUS | Status: AC
  Administered 2017-07-10: .8 [IU]/h via INTRAVENOUS
  Filled 2017-07-09: qty 1

## 2017-07-09 NOTE — Anesthesia Preprocedure Evaluation (Addendum)
Anesthesia Evaluation  Patient identified by MRN, date of birth, ID band Patient awake    Reviewed: Allergy & Precautions, H&P , NPO status , Patient's Chart, lab work & pertinent test results, reviewed documented beta blocker date and time   Airway Mallampati: II  TM Distance: >3 FB Neck ROM: Full    Dental  (+) Missing, Dental Advisory Given, Partial Upper   Pulmonary neg COPD,    breath sounds clear to auscultation       Cardiovascular hypertension, Pt. on medications and Pt. on home beta blockers + Peripheral Vascular Disease   Rhythm:Regular Rate:Normal  TTE 05/2017 - Left ventricle: The cavity size was normal. Systolic function was normal. The estimated ejection fraction was in the range of 55% to 60%. Wall motion was normal; there were no regional wall motion abnormalities. Doppler parameters are consistent with abnormal left ventricular relaxation (grade 1 diastolic dysfunction). - Aortic valve: There was mild to moderate regurgitation directed centrally in the LVOT. - Aorta: Mid-ascending aortic diameter: 48.88 mm (ED). - Ascending aorta: The ascending aorta was moderately dilated. - Left atrium: The atrium was mildly dilated. - Atrial septum: No defect or patent foramen ovale was identified.  9/11 stress test- no ischemia, EF 72%  CT Chest 2018 - Maximal diameter of the ascending aorta is 5.1 cm compared with 5.0 cm on the prior study. Maximal diameter of the aortic arch is 4.3 cm. This is also stable based on my direct measurements on the prior study  Carotid US 2019 - b/l 1-39% ICAS  EKG - SB with RBBB  Cath 05/2017 - 1. Normal coronary arteries with left dominant system 2. Separate ostia for LAD and dominant LCX 3. RCA imaged non-selectively. Small nondominant vessel. Normal 4. Large ascending aortic aneursym 5, Normal LV function EF 55% 6. Normal hemodynamics with high cardiac output 7. No evidence of intracardiac  shunting   Neuro/Psych Anxiety Depression  Neuromuscular disease negative psych ROS   GI/Hepatic Neg liver ROS, hiatal hernia, GERD  Controlled and Medicated,  Endo/Other  Hypothyroidism   Renal/GU Renal disease  negative genitourinary   Musculoskeletal  (+) Arthritis , Fibromyalgia - (celebrex)  Abdominal   Peds  Hematology  (+) anemia ,   Anesthesia Other Findings Glaucoma bilateral  Reproductive/Obstetrics                            Lab Results  Component Value Date   WBC 5.4 07/06/2017   HGB 11.1 (L) 07/06/2017   HCT 37.1 07/06/2017   MCV 82.6 07/06/2017   PLT 265 07/06/2017   Lab Results  Component Value Date   CREATININE 0.56 07/06/2017   BUN 11 07/06/2017   NA 138 07/06/2017   K 3.7 07/06/2017   CL 101 07/06/2017   CO2 23 07/06/2017    Anesthesia Physical  Anesthesia Plan  ASA: IV  Anesthesia Plan: General   Post-op Pain Management:    Induction: Intravenous  PONV Risk Score and Plan: 4 or greater and Treatment may vary due to age or medical condition  Airway Management Planned: Oral ETT  Additional Equipment: Arterial line, CVP, PA Cath, TEE and Ultrasound Guidance Line Placement  Intra-op Plan: Utilization Of Total Body Hypothermia per surgeon request and Delibrate Circulatory arrest per surgeon request  Post-operative Plan: Post-operative intubation/ventilation  Informed Consent: I have reviewed the patients History and Physical, chart, labs and discussed the procedure including the risks, benefits and alternatives for the proposed  anesthesia with the patient or authorized representative who has indicated his/her understanding and acceptance.   Dental advisory given  Plan Discussed with: CRNA and Surgeon  Anesthesia Plan Comments:        Anesthesia Quick Evaluation

## 2017-07-10 ENCOUNTER — Encounter (HOSPITAL_COMMUNITY): Payer: Self-pay | Admitting: *Deleted

## 2017-07-10 ENCOUNTER — Encounter (HOSPITAL_COMMUNITY)
Admission: RE | Disposition: A | Discharge status: Rehabilitation Facility | Payer: Self-pay | Source: Ambulatory Visit | Attending: Cardiothoracic Surgery

## 2017-07-10 ENCOUNTER — Inpatient Hospital Stay (HOSPITAL_COMMUNITY): Payer: Medicare Other

## 2017-07-10 ENCOUNTER — Inpatient Hospital Stay (HOSPITAL_COMMUNITY): Payer: Medicare Other | Admitting: Certified Registered"

## 2017-07-10 ENCOUNTER — Other Ambulatory Visit: Payer: Self-pay

## 2017-07-10 ENCOUNTER — Inpatient Hospital Stay (HOSPITAL_COMMUNITY)
Admission: RE | Admit: 2017-07-10 | Discharge: 2017-08-12 | DRG: 001 | Disposition: A | Discharge status: Rehabilitation Facility | Payer: Medicare Other | Source: Ambulatory Visit | Attending: Cardiothoracic Surgery | Admitting: Cardiothoracic Surgery

## 2017-07-10 DIAGNOSIS — I314 Cardiac tamponade: Secondary | ICD-10-CM | POA: Diagnosis not present

## 2017-07-10 DIAGNOSIS — E538 Deficiency of other specified B group vitamins: Secondary | ICD-10-CM

## 2017-07-10 DIAGNOSIS — I5031 Acute diastolic (congestive) heart failure: Secondary | ICD-10-CM | POA: Diagnosis not present

## 2017-07-10 DIAGNOSIS — Z6835 Body mass index (BMI) 35.0-35.9, adult: Secondary | ICD-10-CM

## 2017-07-10 DIAGNOSIS — T82898A Other specified complication of vascular prosthetic devices, implants and grafts, initial encounter: Secondary | ICD-10-CM | POA: Diagnosis not present

## 2017-07-10 DIAGNOSIS — J189 Pneumonia, unspecified organism: Secondary | ICD-10-CM

## 2017-07-10 DIAGNOSIS — I351 Nonrheumatic aortic (valve) insufficiency: Secondary | ICD-10-CM | POA: Diagnosis not present

## 2017-07-10 DIAGNOSIS — I083 Combined rheumatic disorders of mitral, aortic and tricuspid valves: Secondary | ICD-10-CM | POA: Diagnosis present

## 2017-07-10 DIAGNOSIS — J9690 Respiratory failure, unspecified, unspecified whether with hypoxia or hypercapnia: Secondary | ICD-10-CM

## 2017-07-10 DIAGNOSIS — M797 Fibromyalgia: Secondary | ICD-10-CM | POA: Diagnosis not present

## 2017-07-10 DIAGNOSIS — M810 Age-related osteoporosis without current pathological fracture: Secondary | ICD-10-CM | POA: Diagnosis present

## 2017-07-10 DIAGNOSIS — E785 Hyperlipidemia, unspecified: Secondary | ICD-10-CM | POA: Diagnosis present

## 2017-07-10 DIAGNOSIS — D62 Acute posthemorrhagic anemia: Secondary | ICD-10-CM | POA: Diagnosis not present

## 2017-07-10 DIAGNOSIS — Z4659 Encounter for fitting and adjustment of other gastrointestinal appliance and device: Secondary | ICD-10-CM

## 2017-07-10 DIAGNOSIS — I712 Thoracic aortic aneurysm, without rupture, unspecified: Secondary | ICD-10-CM

## 2017-07-10 DIAGNOSIS — Z85850 Personal history of malignant neoplasm of thyroid: Secondary | ICD-10-CM | POA: Diagnosis not present

## 2017-07-10 DIAGNOSIS — E876 Hypokalemia: Secondary | ICD-10-CM | POA: Diagnosis not present

## 2017-07-10 DIAGNOSIS — I711 Thoracic aortic aneurysm, ruptured: Secondary | ICD-10-CM | POA: Diagnosis not present

## 2017-07-10 DIAGNOSIS — I4901 Ventricular fibrillation: Secondary | ICD-10-CM | POA: Diagnosis not present

## 2017-07-10 DIAGNOSIS — F419 Anxiety disorder, unspecified: Secondary | ICD-10-CM | POA: Diagnosis not present

## 2017-07-10 DIAGNOSIS — Z952 Presence of prosthetic heart valve: Secondary | ICD-10-CM | POA: Diagnosis not present

## 2017-07-10 DIAGNOSIS — Z978 Presence of other specified devices: Secondary | ICD-10-CM

## 2017-07-10 DIAGNOSIS — J9621 Acute and chronic respiratory failure with hypoxia: Secondary | ICD-10-CM | POA: Diagnosis not present

## 2017-07-10 DIAGNOSIS — R0602 Shortness of breath: Secondary | ICD-10-CM | POA: Diagnosis not present

## 2017-07-10 DIAGNOSIS — Z951 Presence of aortocoronary bypass graft: Secondary | ICD-10-CM

## 2017-07-10 DIAGNOSIS — J9601 Acute respiratory failure with hypoxia: Secondary | ICD-10-CM | POA: Diagnosis not present

## 2017-07-10 DIAGNOSIS — Z0189 Encounter for other specified special examinations: Secondary | ICD-10-CM

## 2017-07-10 DIAGNOSIS — J440 Chronic obstructive pulmonary disease with acute lower respiratory infection: Secondary | ICD-10-CM | POA: Diagnosis not present

## 2017-07-10 DIAGNOSIS — T17908A Unspecified foreign body in respiratory tract, part unspecified causing other injury, initial encounter: Secondary | ICD-10-CM | POA: Diagnosis not present

## 2017-07-10 DIAGNOSIS — I081 Rheumatic disorders of both mitral and tricuspid valves: Secondary | ICD-10-CM | POA: Diagnosis not present

## 2017-07-10 DIAGNOSIS — Z9889 Other specified postprocedural states: Secondary | ICD-10-CM

## 2017-07-10 DIAGNOSIS — R1310 Dysphagia, unspecified: Secondary | ICD-10-CM | POA: Diagnosis present

## 2017-07-10 DIAGNOSIS — J810 Acute pulmonary edema: Secondary | ICD-10-CM | POA: Diagnosis not present

## 2017-07-10 DIAGNOSIS — Z9911 Dependence on respirator [ventilator] status: Secondary | ICD-10-CM | POA: Diagnosis not present

## 2017-07-10 DIAGNOSIS — I5041 Acute combined systolic (congestive) and diastolic (congestive) heart failure: Secondary | ICD-10-CM | POA: Diagnosis not present

## 2017-07-10 DIAGNOSIS — I25810 Atherosclerosis of coronary artery bypass graft(s) without angina pectoris: Secondary | ICD-10-CM | POA: Diagnosis not present

## 2017-07-10 DIAGNOSIS — J90 Pleural effusion, not elsewhere classified: Secondary | ICD-10-CM | POA: Diagnosis not present

## 2017-07-10 DIAGNOSIS — I7121 Aneurysm of the ascending aorta, without rupture: Secondary | ICD-10-CM

## 2017-07-10 DIAGNOSIS — G9340 Encephalopathy, unspecified: Secondary | ICD-10-CM

## 2017-07-10 DIAGNOSIS — I2729 Other secondary pulmonary hypertension: Secondary | ICD-10-CM | POA: Diagnosis present

## 2017-07-10 DIAGNOSIS — Z781 Physical restraint status: Secondary | ICD-10-CM

## 2017-07-10 DIAGNOSIS — Z09 Encounter for follow-up examination after completed treatment for conditions other than malignant neoplasm: Secondary | ICD-10-CM

## 2017-07-10 DIAGNOSIS — I509 Heart failure, unspecified: Secondary | ICD-10-CM | POA: Diagnosis not present

## 2017-07-10 DIAGNOSIS — G4700 Insomnia, unspecified: Secondary | ICD-10-CM | POA: Diagnosis not present

## 2017-07-10 DIAGNOSIS — R601 Generalized edema: Secondary | ICD-10-CM | POA: Diagnosis not present

## 2017-07-10 DIAGNOSIS — R570 Cardiogenic shock: Secondary | ICD-10-CM | POA: Diagnosis not present

## 2017-07-10 DIAGNOSIS — Z930 Tracheostomy status: Secondary | ICD-10-CM

## 2017-07-10 DIAGNOSIS — I110 Hypertensive heart disease with heart failure: Secondary | ICD-10-CM | POA: Diagnosis not present

## 2017-07-10 DIAGNOSIS — Z9842 Cataract extraction status, left eye: Secondary | ICD-10-CM

## 2017-07-10 DIAGNOSIS — J811 Chronic pulmonary edema: Secondary | ICD-10-CM | POA: Diagnosis not present

## 2017-07-10 DIAGNOSIS — Y95 Nosocomial condition: Secondary | ICD-10-CM | POA: Diagnosis not present

## 2017-07-10 DIAGNOSIS — I361 Nonrheumatic tricuspid (valve) insufficiency: Secondary | ICD-10-CM | POA: Diagnosis not present

## 2017-07-10 DIAGNOSIS — N179 Acute kidney failure, unspecified: Secondary | ICD-10-CM | POA: Diagnosis present

## 2017-07-10 DIAGNOSIS — Z789 Other specified health status: Secondary | ICD-10-CM

## 2017-07-10 DIAGNOSIS — Z7982 Long term (current) use of aspirin: Secondary | ICD-10-CM

## 2017-07-10 DIAGNOSIS — R5381 Other malaise: Secondary | ICD-10-CM | POA: Diagnosis not present

## 2017-07-10 DIAGNOSIS — H409 Unspecified glaucoma: Secondary | ICD-10-CM | POA: Diagnosis not present

## 2017-07-10 DIAGNOSIS — M7981 Nontraumatic hematoma of soft tissue: Secondary | ICD-10-CM | POA: Diagnosis not present

## 2017-07-10 DIAGNOSIS — Z95811 Presence of heart assist device: Secondary | ICD-10-CM | POA: Diagnosis not present

## 2017-07-10 DIAGNOSIS — S2020XA Contusion of thorax, unspecified, initial encounter: Secondary | ICD-10-CM | POA: Diagnosis not present

## 2017-07-10 DIAGNOSIS — I5082 Biventricular heart failure: Secondary | ICD-10-CM | POA: Diagnosis present

## 2017-07-10 DIAGNOSIS — D689 Coagulation defect, unspecified: Secondary | ICD-10-CM | POA: Diagnosis not present

## 2017-07-10 DIAGNOSIS — R251 Tremor, unspecified: Secondary | ICD-10-CM | POA: Diagnosis not present

## 2017-07-10 DIAGNOSIS — E039 Hypothyroidism, unspecified: Secondary | ICD-10-CM | POA: Diagnosis present

## 2017-07-10 DIAGNOSIS — R609 Edema, unspecified: Secondary | ICD-10-CM | POA: Diagnosis not present

## 2017-07-10 DIAGNOSIS — E874 Mixed disorder of acid-base balance: Secondary | ICD-10-CM | POA: Diagnosis not present

## 2017-07-10 DIAGNOSIS — J939 Pneumothorax, unspecified: Secondary | ICD-10-CM

## 2017-07-10 DIAGNOSIS — Z452 Encounter for adjustment and management of vascular access device: Secondary | ICD-10-CM | POA: Diagnosis not present

## 2017-07-10 DIAGNOSIS — D6489 Other specified anemias: Secondary | ICD-10-CM | POA: Diagnosis present

## 2017-07-10 DIAGNOSIS — L8990 Pressure ulcer of unspecified site, unspecified stage: Secondary | ICD-10-CM

## 2017-07-10 DIAGNOSIS — I480 Paroxysmal atrial fibrillation: Secondary | ICD-10-CM | POA: Diagnosis not present

## 2017-07-10 DIAGNOSIS — E669 Obesity, unspecified: Secondary | ICD-10-CM | POA: Diagnosis present

## 2017-07-10 DIAGNOSIS — J9811 Atelectasis: Secondary | ICD-10-CM | POA: Diagnosis not present

## 2017-07-10 DIAGNOSIS — Z79899 Other long term (current) drug therapy: Secondary | ICD-10-CM

## 2017-07-10 DIAGNOSIS — I4891 Unspecified atrial fibrillation: Secondary | ICD-10-CM | POA: Diagnosis not present

## 2017-07-10 DIAGNOSIS — K219 Gastro-esophageal reflux disease without esophagitis: Secondary | ICD-10-CM | POA: Diagnosis present

## 2017-07-10 DIAGNOSIS — R739 Hyperglycemia, unspecified: Secondary | ICD-10-CM | POA: Diagnosis not present

## 2017-07-10 DIAGNOSIS — Z9689 Presence of other specified functional implants: Secondary | ICD-10-CM

## 2017-07-10 DIAGNOSIS — J9600 Acute respiratory failure, unspecified whether with hypoxia or hypercapnia: Secondary | ICD-10-CM

## 2017-07-10 DIAGNOSIS — F329 Major depressive disorder, single episode, unspecified: Secondary | ICD-10-CM | POA: Diagnosis not present

## 2017-07-10 DIAGNOSIS — E878 Other disorders of electrolyte and fluid balance, not elsewhere classified: Secondary | ICD-10-CM | POA: Diagnosis present

## 2017-07-10 DIAGNOSIS — K567 Ileus, unspecified: Secondary | ICD-10-CM

## 2017-07-10 DIAGNOSIS — Z96651 Presence of right artificial knee joint: Secondary | ICD-10-CM | POA: Diagnosis present

## 2017-07-10 DIAGNOSIS — I97638 Postprocedural hematoma of a circulatory system organ or structure following other circulatory system procedure: Secondary | ICD-10-CM | POA: Diagnosis not present

## 2017-07-10 DIAGNOSIS — J181 Lobar pneumonia, unspecified organism: Secondary | ICD-10-CM | POA: Diagnosis not present

## 2017-07-10 DIAGNOSIS — D696 Thrombocytopenia, unspecified: Secondary | ICD-10-CM | POA: Diagnosis not present

## 2017-07-10 DIAGNOSIS — R918 Other nonspecific abnormal finding of lung field: Secondary | ICD-10-CM | POA: Diagnosis not present

## 2017-07-10 DIAGNOSIS — E1165 Type 2 diabetes mellitus with hyperglycemia: Secondary | ICD-10-CM | POA: Diagnosis present

## 2017-07-10 DIAGNOSIS — Z4682 Encounter for fitting and adjustment of non-vascular catheter: Secondary | ICD-10-CM | POA: Diagnosis not present

## 2017-07-10 DIAGNOSIS — I10 Essential (primary) hypertension: Secondary | ICD-10-CM | POA: Diagnosis not present

## 2017-07-10 DIAGNOSIS — R443 Hallucinations, unspecified: Secondary | ICD-10-CM | POA: Diagnosis not present

## 2017-07-10 DIAGNOSIS — R194 Change in bowel habit: Secondary | ICD-10-CM | POA: Diagnosis not present

## 2017-07-10 DIAGNOSIS — I50810 Right heart failure, unspecified: Secondary | ICD-10-CM

## 2017-07-10 DIAGNOSIS — F05 Delirium due to known physiological condition: Secondary | ICD-10-CM | POA: Diagnosis not present

## 2017-07-10 DIAGNOSIS — E890 Postprocedural hypothyroidism: Secondary | ICD-10-CM | POA: Diagnosis not present

## 2017-07-10 DIAGNOSIS — K5900 Constipation, unspecified: Secondary | ICD-10-CM | POA: Diagnosis not present

## 2017-07-10 DIAGNOSIS — Z9049 Acquired absence of other specified parts of digestive tract: Secondary | ICD-10-CM

## 2017-07-10 DIAGNOSIS — R0902 Hypoxemia: Secondary | ICD-10-CM | POA: Diagnosis not present

## 2017-07-10 DIAGNOSIS — R740 Nonspecific elevation of levels of transaminase and lactic acid dehydrogenase [LDH]: Secondary | ICD-10-CM | POA: Diagnosis present

## 2017-07-10 DIAGNOSIS — Z961 Presence of intraocular lens: Secondary | ICD-10-CM | POA: Diagnosis present

## 2017-07-10 HISTORY — PX: REPLACEMENT ASCENDING AORTA: SHX6068

## 2017-07-10 HISTORY — PX: TEE WITHOUT CARDIOVERSION: SHX5443

## 2017-07-10 HISTORY — PX: AORTIC VALVE REPLACEMENT: SHX41

## 2017-07-10 LAB — POCT I-STAT, CHEM 8
BUN: 8 mg/dL (ref 6–20)
BUN: 7 mg/dL (ref 6–20)
BUN: 7 mg/dL (ref 6–20)
BUN: 8 mg/dL (ref 6–20)
BUN: 8 mg/dL (ref 6–20)
BUN: 9 mg/dL (ref 6–20)
Calcium, Ion: 1.07 mmol/L — ABNORMAL LOW (ref 1.15–1.40)
Calcium, Ion: 0.91 mmol/L — ABNORMAL LOW (ref 1.15–1.40)
Calcium, Ion: 1.05 mmol/L — ABNORMAL LOW (ref 1.15–1.40)
Calcium, Ion: 0.86 mmol/L — CL (ref 1.15–1.40)
Calcium, Ion: 0.95 mmol/L — ABNORMAL LOW (ref 1.15–1.40)
Calcium, Ion: 1.09 mmol/L — ABNORMAL LOW (ref 1.15–1.40)
Chloride: 103 mmol/L (ref 101–111)
Chloride: 103 mmol/L (ref 101–111)
Chloride: 99 mmol/L — ABNORMAL LOW (ref 101–111)
Chloride: 102 mmol/L (ref 101–111)
Chloride: 103 mmol/L (ref 101–111)
Chloride: 102 mmol/L (ref 101–111)
Creatinine, Ser: 0.4 mg/dL — ABNORMAL LOW (ref 0.44–1.00)
Creatinine, Ser: 0.4 mg/dL — ABNORMAL LOW (ref 0.44–1.00)
Creatinine, Ser: 0.5 mg/dL (ref 0.44–1.00)
Creatinine, Ser: 0.4 mg/dL — ABNORMAL LOW (ref 0.44–1.00)
Creatinine, Ser: 0.5 mg/dL (ref 0.44–1.00)
Creatinine, Ser: 0.5 mg/dL (ref 0.44–1.00)
Glucose, Bld: 99 mg/dL (ref 65–99)
Glucose, Bld: 135 mg/dL — ABNORMAL HIGH (ref 65–99)
Glucose, Bld: 142 mg/dL — ABNORMAL HIGH (ref 65–99)
Glucose, Bld: 166 mg/dL — ABNORMAL HIGH (ref 65–99)
Glucose, Bld: 223 mg/dL — ABNORMAL HIGH (ref 65–99)
Glucose, Bld: 208 mg/dL — ABNORMAL HIGH (ref 65–99)
HCT: 28 % — ABNORMAL LOW (ref 36.0–46.0)
HCT: 20 % — ABNORMAL LOW (ref 36.0–46.0)
HCT: 26 % — ABNORMAL LOW (ref 36.0–46.0)
HCT: 22 % — ABNORMAL LOW (ref 36.0–46.0)
HCT: 21 % — ABNORMAL LOW (ref 36.0–46.0)
HCT: 24 % — ABNORMAL LOW (ref 36.0–46.0)
Hemoglobin: 9.5 g/dL — ABNORMAL LOW (ref 12.0–15.0)
Hemoglobin: 6.8 g/dL — CL (ref 12.0–15.0)
Hemoglobin: 8.8 g/dL — ABNORMAL LOW (ref 12.0–15.0)
Hemoglobin: 7.5 g/dL — ABNORMAL LOW (ref 12.0–15.0)
Hemoglobin: 7.1 g/dL — ABNORMAL LOW (ref 12.0–15.0)
Hemoglobin: 8.2 g/dL — ABNORMAL LOW (ref 12.0–15.0)
Potassium: 3.3 mmol/L — ABNORMAL LOW (ref 3.5–5.1)
Potassium: 2.9 mmol/L — ABNORMAL LOW (ref 3.5–5.1)
Potassium: 2.9 mmol/L — ABNORMAL LOW (ref 3.5–5.1)
Potassium: 3.2 mmol/L — ABNORMAL LOW (ref 3.5–5.1)
Potassium: 3.7 mmol/L (ref 3.5–5.1)
Potassium: 3.2 mmol/L — ABNORMAL LOW (ref 3.5–5.1)
Sodium: 143 mmol/L (ref 135–145)
Sodium: 144 mmol/L (ref 135–145)
Sodium: 144 mmol/L (ref 135–145)
Sodium: 142 mmol/L (ref 135–145)
Sodium: 141 mmol/L (ref 135–145)
Sodium: 145 mmol/L (ref 135–145)
TCO2: 27 mmol/L (ref 22–32)
TCO2: 27 mmol/L (ref 22–32)
TCO2: 27 mmol/L (ref 22–32)
TCO2: 27 mmol/L (ref 22–32)
TCO2: 24 mmol/L (ref 22–32)
TCO2: 27 mmol/L (ref 22–32)

## 2017-07-10 LAB — POCT I-STAT 3, ART BLOOD GAS (G3+)
Acid-Base Excess: 3 mmol/L — ABNORMAL HIGH (ref 0.0–2.0)
Acid-Base Excess: 8 mmol/L — ABNORMAL HIGH (ref 0.0–2.0)
Acid-base deficit: 4 mmol/L — ABNORMAL HIGH (ref 0.0–2.0)
Acid-base deficit: 3 mmol/L — ABNORMAL HIGH (ref 0.0–2.0)
Bicarbonate: 27 mmol/L (ref 20.0–28.0)
Bicarbonate: 22 mmol/L (ref 20.0–28.0)
Bicarbonate: 22.3 mmol/L (ref 20.0–28.0)
Bicarbonate: 33.2 mmol/L — ABNORMAL HIGH (ref 20.0–28.0)
O2 Saturation: 100 %
O2 Saturation: 100 %
O2 Saturation: 100 %
O2 Saturation: 91 %
TCO2: 28 mmol/L (ref 22–32)
TCO2: 23 mmol/L (ref 22–32)
TCO2: 23 mmol/L (ref 22–32)
TCO2: 35 mmol/L — ABNORMAL HIGH (ref 22–32)
pCO2 arterial: 35.3 mmHg (ref 32.0–48.0)
pCO2 arterial: 43.1 mmHg (ref 32.0–48.0)
pCO2 arterial: 39.8 mmHg (ref 32.0–48.0)
pCO2 arterial: 49.6 mmHg — ABNORMAL HIGH (ref 32.0–48.0)
pH, Arterial: 7.492 — ABNORMAL HIGH (ref 7.350–7.450)
pH, Arterial: 7.316 — ABNORMAL LOW (ref 7.350–7.450)
pH, Arterial: 7.356 (ref 7.350–7.450)
pH, Arterial: 7.433 (ref 7.350–7.450)
pO2, Arterial: 313 mmHg — ABNORMAL HIGH (ref 83.0–108.0)
pO2, Arterial: 324 mmHg — ABNORMAL HIGH (ref 83.0–108.0)
pO2, Arterial: 186 mmHg — ABNORMAL HIGH (ref 83.0–108.0)
pO2, Arterial: 60 mmHg — ABNORMAL LOW (ref 83.0–108.0)

## 2017-07-10 LAB — CBC
HCT: 31.3 % — ABNORMAL LOW (ref 36.0–46.0)
HCT: 31.2 % — ABNORMAL LOW (ref 36.0–46.0)
Hemoglobin: 10.2 g/dL — ABNORMAL LOW (ref 12.0–15.0)
Hemoglobin: 10.4 g/dL — ABNORMAL LOW (ref 12.0–15.0)
MCH: 26.8 pg (ref 26.0–34.0)
MCH: 27.7 pg (ref 26.0–34.0)
MCHC: 32.6 g/dL (ref 30.0–36.0)
MCHC: 33.3 g/dL (ref 30.0–36.0)
MCV: 82.4 fL (ref 78.0–100.0)
MCV: 83.2 fL (ref 78.0–100.0)
PLATELETS: 128 10*3/uL — AB (ref 150–400)
Platelets: 176 10*3/uL (ref 150–400)
RBC: 3.8 MIL/uL — ABNORMAL LOW (ref 3.87–5.11)
RBC: 3.75 MIL/uL — ABNORMAL LOW (ref 3.87–5.11)
RDW: 14.9 % (ref 11.5–15.5)
RDW: 14.9 % (ref 11.5–15.5)
WBC: 7.5 10*3/uL (ref 4.0–10.5)
WBC: 9.9 10*3/uL (ref 4.0–10.5)

## 2017-07-10 LAB — APTT: APTT: 42 s — AB (ref 24–36)

## 2017-07-10 LAB — GLUCOSE, CAPILLARY
Glucose-Capillary: 120 mg/dL — ABNORMAL HIGH (ref 65–99)
Glucose-Capillary: 108 mg/dL — ABNORMAL HIGH (ref 65–99)
Glucose-Capillary: 106 mg/dL — ABNORMAL HIGH (ref 65–99)
Glucose-Capillary: 119 mg/dL — ABNORMAL HIGH (ref 65–99)
Glucose-Capillary: 147 mg/dL — ABNORMAL HIGH (ref 65–99)
Glucose-Capillary: 161 mg/dL — ABNORMAL HIGH (ref 65–99)

## 2017-07-10 LAB — COOXEMETRY PANEL
Carboxyhemoglobin: 1.6 % — ABNORMAL HIGH (ref 0.5–1.5)
Carboxyhemoglobin: 1.4 % (ref 0.5–1.5)
Methemoglobin: 1 % (ref 0.0–1.5)
Methemoglobin: 0.8 % (ref 0.0–1.5)
O2 Saturation: 76.8 %
O2 Saturation: 72.4 %
Total hemoglobin: 7 g/dL — ABNORMAL LOW (ref 12.0–16.0)
Total hemoglobin: 10.3 g/dL — ABNORMAL LOW (ref 12.0–16.0)

## 2017-07-10 LAB — PROTIME-INR
INR: 1.67
PROTHROMBIN TIME: 19.5 s — AB (ref 11.4–15.2)

## 2017-07-10 LAB — HEMOGLOBIN AND HEMATOCRIT, BLOOD
HCT: 22.9 % — ABNORMAL LOW (ref 36.0–46.0)
Hemoglobin: 7.3 g/dL — ABNORMAL LOW (ref 12.0–15.0)

## 2017-07-10 LAB — POCT I-STAT 4, (NA,K, GLUC, HGB,HCT)
Glucose, Bld: 136 mg/dL — ABNORMAL HIGH (ref 65–99)
HCT: 29 % — ABNORMAL LOW (ref 36.0–46.0)
Hemoglobin: 9.9 g/dL — ABNORMAL LOW (ref 12.0–15.0)
Potassium: 2.9 mmol/L — ABNORMAL LOW (ref 3.5–5.1)
Sodium: 147 mmol/L — ABNORMAL HIGH (ref 135–145)

## 2017-07-10 LAB — PLATELET COUNT: Platelets: 85 10*3/uL — ABNORMAL LOW (ref 150–400)

## 2017-07-10 LAB — ECHO INTRAOPERATIVE TEE
Ao-asc: 4.6 cm
P 1/2 time: 459 ms
Weight: 2688 oz

## 2017-07-10 LAB — PREPARE RBC (CROSSMATCH)

## 2017-07-10 LAB — MAGNESIUM: Magnesium: 2.7 mg/dL — ABNORMAL HIGH (ref 1.7–2.4)

## 2017-07-10 LAB — CK: Total CK: 396 U/L — ABNORMAL HIGH (ref 38–234)

## 2017-07-10 LAB — FIBRINOGEN: Fibrinogen: 209 mg/dL — ABNORMAL LOW (ref 210–475)

## 2017-07-10 SURGERY — REPLACEMENT, AORTIC VALVE, OPEN
Anesthesia: General | Site: Chest

## 2017-07-10 MED ORDER — LACTATED RINGERS IV SOLN
INTRAVENOUS | Status: DC | PRN
  Administered 2017-07-10: 07:00:00 via INTRAVENOUS

## 2017-07-10 MED ORDER — FUROSEMIDE 10 MG/ML IJ SOLN
10.0000 mg | Freq: Once | INTRAMUSCULAR | Status: AC
  Administered 2017-07-10: 10 mg via INTRAVENOUS
  Filled 2017-07-10: qty 2

## 2017-07-10 MED ORDER — VANCOMYCIN HCL IN DEXTROSE 1-5 GM/200ML-% IV SOLN
1000.0000 mg | Freq: Once | INTRAVENOUS | Status: AC
  Administered 2017-07-10: 1000 mg via INTRAVENOUS
  Filled 2017-07-10: qty 200

## 2017-07-10 MED ORDER — ALBUMIN HUMAN 5 % IV SOLN
INTRAVENOUS | Status: DC | PRN
  Administered 2017-07-10: 14:00:00 via INTRAVENOUS

## 2017-07-10 MED ORDER — DEXTROSE 5 % IV SOLN
INTRAVENOUS | Status: DC | PRN
  Administered 2017-07-10: 20 ug/min via INTRAVENOUS

## 2017-07-10 MED ORDER — SODIUM CHLORIDE 0.9% FLUSH
10.0000 mL | INTRAVENOUS | Status: DC | PRN
Start: 2017-07-10 — End: 2017-07-11

## 2017-07-10 MED ORDER — CHLORHEXIDINE GLUCONATE 0.12 % MT SOLN
15.0000 mL | OROMUCOSAL | Status: AC
  Administered 2017-07-10: 15 mL via OROMUCOSAL

## 2017-07-10 MED ORDER — 0.9 % SODIUM CHLORIDE (POUR BTL) OPTIME
TOPICAL | Status: DC | PRN
  Administered 2017-07-10: 5000 mL
  Administered 2017-07-10: 1000 mL

## 2017-07-10 MED ORDER — ASPIRIN 81 MG PO CHEW
324.0000 mg | CHEWABLE_TABLET | Freq: Every day | ORAL | Status: DC
  Administered 2017-07-13 – 2017-07-16 (×4): 324 mg
  Filled 2017-07-10 (×4): qty 4

## 2017-07-10 MED ORDER — FENTANYL CITRATE (PF) 250 MCG/5ML IJ SOLN
INTRAMUSCULAR | Status: DC | PRN
  Administered 2017-07-10: 150 ug via INTRAVENOUS
  Administered 2017-07-10: 25 ug via INTRAVENOUS
  Administered 2017-07-10: 100 ug via INTRAVENOUS
  Administered 2017-07-10: 150 ug via INTRAVENOUS
  Administered 2017-07-10 (×2): 50 ug via INTRAVENOUS
  Administered 2017-07-10: 200 ug via INTRAVENOUS
  Administered 2017-07-10: 100 ug via INTRAVENOUS
  Administered 2017-07-10: 675 ug via INTRAVENOUS

## 2017-07-10 MED ORDER — SODIUM CHLORIDE 0.9 % IV SOLN
0.0000 ug/min | INTRAVENOUS | Status: DC
  Administered 2017-07-10: 60 ug/min via INTRAVENOUS
  Administered 2017-07-10: 70 ug/min via INTRAVENOUS
  Filled 2017-07-10 (×2): qty 20

## 2017-07-10 MED ORDER — LACTATED RINGERS IV SOLN
INTRAVENOUS | Status: DC
  Administered 2017-07-11 (×2): via INTRAVENOUS

## 2017-07-10 MED ORDER — NOREPINEPHRINE BITARTRATE 1 MG/ML IV SOLN
0.0000 ug/min | INTRAVENOUS | Status: DC
  Administered 2017-07-10: 2 ug/min via INTRAVENOUS
  Administered 2017-07-11: 22 ug/min via INTRAVENOUS
  Administered 2017-07-11: 07:00:00 via INTRAVENOUS
  Filled 2017-07-10 (×5): qty 4

## 2017-07-10 MED ORDER — SODIUM CHLORIDE 0.9 % IV SOLN
Freq: Once | INTRAVENOUS | Status: AC
  Administered 2017-07-10: 16:00:00 via INTRAVENOUS

## 2017-07-10 MED ORDER — EPINEPHRINE PF 1 MG/ML IJ SOLN
0.5000 ug/min | INTRAVENOUS | Status: DC
  Administered 2017-07-10: 1 ug/min via INTRAVENOUS
  Administered 2017-07-11: 9 ug/min via INTRAVENOUS
  Administered 2017-07-11: 8 ug/min via INTRAVENOUS
  Filled 2017-07-10 (×2): qty 4

## 2017-07-10 MED ORDER — METOPROLOL TARTRATE 12.5 MG HALF TABLET: 12.5000 mg | ORAL_TABLET | Freq: Two times a day (BID) | ORAL | Status: DC

## 2017-07-10 MED ORDER — CHLORHEXIDINE GLUCONATE 0.12% ORAL RINSE (MEDLINE KIT)
15.0000 mL | Freq: Two times a day (BID) | OROMUCOSAL | Status: DC
  Administered 2017-07-10 – 2017-07-11 (×2): 15 mL via OROMUCOSAL

## 2017-07-10 MED ORDER — LATANOPROST 0.005 % OP SOLN
1.0000 [drp] | Freq: Every day | OPHTHALMIC | Status: DC
  Administered 2017-07-10 – 2017-07-16 (×7): 1 [drp] via OPHTHALMIC
  Filled 2017-07-10: qty 2.5

## 2017-07-10 MED ORDER — CALCIUM CHLORIDE 10 % IV SOLN
1.0000 g | Freq: Once | INTRAVENOUS | Status: AC
  Administered 2017-07-10: 1 g via INTRAVENOUS

## 2017-07-10 MED ORDER — DEXTROSE 5 % IV SOLN
1.5000 g | Freq: Two times a day (BID) | INTRAVENOUS | Status: DC
  Administered 2017-07-10: 1.5 g via INTRAVENOUS
  Filled 2017-07-10 (×2): qty 1.5

## 2017-07-10 MED ORDER — INSULIN REGULAR BOLUS VIA INFUSION
0.0000 [IU] | Freq: Three times a day (TID) | INTRAVENOUS | Status: DC
  Filled 2017-07-10: qty 10

## 2017-07-10 MED ORDER — ASPIRIN EC 325 MG PO TBEC: 325.0000 mg | DELAYED_RELEASE_TABLET | Freq: Every day | ORAL | Status: DC

## 2017-07-10 MED ORDER — BISACODYL 10 MG RE SUPP
10.0000 mg | Freq: Every day | RECTAL | Status: DC
  Administered 2017-07-15 – 2017-07-16 (×2): 10 mg via RECTAL
  Filled 2017-07-10 (×2): qty 1

## 2017-07-10 MED ORDER — SODIUM CHLORIDE 0.9 % IV SOLN
20.0000 ug | INTRAVENOUS | Status: AC
  Administered 2017-07-10: 20 ug via INTRAVENOUS
  Filled 2017-07-10: qty 5

## 2017-07-10 MED ORDER — SODIUM CHLORIDE 0.9% FLUSH
3.0000 mL | Freq: Two times a day (BID) | INTRAVENOUS | Status: DC
  Administered 2017-07-11 – 2017-07-12 (×2): 3 mL via INTRAVENOUS

## 2017-07-10 MED ORDER — BISACODYL 5 MG PO TBEC: 10.0000 mg | DELAYED_RELEASE_TABLET | Freq: Every day | ORAL | Status: DC

## 2017-07-10 MED ORDER — FENTANYL CITRATE (PF) 250 MCG/5ML IJ SOLN
INTRAMUSCULAR | Status: AC
  Filled 2017-07-10: qty 25

## 2017-07-10 MED ORDER — ONDANSETRON HCL 4 MG/2ML IJ SOLN: 4.0000 mg | Freq: Four times a day (QID) | INTRAMUSCULAR | Status: DC | PRN

## 2017-07-10 MED ORDER — COAGULATION FACTOR VIIA RECOMB 1 MG IV SOLR
5000.0000 ug | INTRAVENOUS | Status: AC
  Administered 2017-07-10: 2500 ug via INTRAVENOUS
  Filled 2017-07-10: qty 5

## 2017-07-10 MED ORDER — NITROGLYCERIN IN D5W 200-5 MCG/ML-% IV SOLN: 0.0000 ug/min | INTRAVENOUS | Status: DC

## 2017-07-10 MED ORDER — HEMOSTATIC AGENTS (NO CHARGE) OPTIME
TOPICAL | Status: DC | PRN
  Administered 2017-07-10 (×4): 1 via TOPICAL

## 2017-07-10 MED ORDER — MAGNESIUM SULFATE 2 GM/50ML IV SOLN
2.0000 g | Freq: Once | INTRAVENOUS | Status: AC
  Administered 2017-07-10: 2 g via INTRAVENOUS
  Filled 2017-07-10: qty 50

## 2017-07-10 MED ORDER — ORAL CARE MOUTH RINSE
15.0000 mL | Freq: Four times a day (QID) | OROMUCOSAL | Status: DC
  Administered 2017-07-11 (×4): 15 mL via OROMUCOSAL

## 2017-07-10 MED ORDER — HEMOSTATIC AGENTS (NO CHARGE) OPTIME
TOPICAL | Status: DC | PRN
  Administered 2017-07-10: 1 via TOPICAL

## 2017-07-10 MED ORDER — SODIUM CHLORIDE 0.9% FLUSH: 3.0000 mL | INTRAVENOUS | Status: DC | PRN

## 2017-07-10 MED ORDER — SODIUM CHLORIDE 0.9 % IV SOLN
INTRAVENOUS | Status: DC | PRN
  Administered 2017-07-10: 14:00:00 via INTRAVENOUS

## 2017-07-10 MED ORDER — PROPOFOL 10 MG/ML IV BOLUS
INTRAVENOUS | Status: AC
  Filled 2017-07-10: qty 20

## 2017-07-10 MED ORDER — METHYLPREDNISOLONE SODIUM SUCC 125 MG IJ SOLR
INTRAMUSCULAR | Status: DC | PRN
  Administered 2017-07-10: 125 mg via INTRAVENOUS

## 2017-07-10 MED ORDER — CHLORHEXIDINE GLUCONATE 4 % EX LIQD: 30.0000 mL | CUTANEOUS | Status: DC

## 2017-07-10 MED ORDER — FAMOTIDINE IN NACL 20-0.9 MG/50ML-% IV SOLN
20.0000 mg | Freq: Two times a day (BID) | INTRAVENOUS | Status: AC
  Administered 2017-07-10 (×2): 20 mg via INTRAVENOUS
  Filled 2017-07-10: qty 50

## 2017-07-10 MED ORDER — SODIUM CHLORIDE 0.9 % IV SOLN
INTRAVENOUS | Status: DC
  Administered 2017-07-10: 2.3 [IU]/h via INTRAVENOUS
  Administered 2017-07-11: 4.4 [IU]/h via INTRAVENOUS
  Administered 2017-07-11: 7.7 [IU]/h via INTRAVENOUS
  Administered 2017-07-12: 3 [IU]/h via INTRAVENOUS
  Filled 2017-07-10 (×3): qty 1

## 2017-07-10 MED ORDER — PROTAMINE SULFATE 10 MG/ML IV SOLN
INTRAVENOUS | Status: DC | PRN
  Administered 2017-07-10: 200 mg via INTRAVENOUS
  Administered 2017-07-10: 10 mg via INTRAVENOUS

## 2017-07-10 MED ORDER — CHLORHEXIDINE GLUCONATE 0.12 % MT SOLN
15.0000 mL | Freq: Once | OROMUCOSAL | Status: AC
  Administered 2017-07-10: 15 mL via OROMUCOSAL
  Filled 2017-07-10: qty 15

## 2017-07-10 MED ORDER — AMIODARONE LOAD VIA INFUSION
150.0000 mg | INTRAVENOUS | Status: AC
  Administered 2017-07-10: 150 mg via INTRAVENOUS

## 2017-07-10 MED ORDER — MIDAZOLAM HCL 10 MG/2ML IJ SOLN
INTRAMUSCULAR | Status: AC
Start: 2017-07-10 — End: 2017-07-10
  Filled 2017-07-10: qty 2

## 2017-07-10 MED ORDER — ACETAMINOPHEN 160 MG/5ML PO SOLN: 650.0000 mg | Freq: Once | ORAL | Status: AC

## 2017-07-10 MED ORDER — METOPROLOL TARTRATE 5 MG/5ML IV SOLN: 2.5000 mg | INTRAVENOUS | Status: DC | PRN

## 2017-07-10 MED ORDER — LIDOCAINE 2% (20 MG/ML) 5 ML SYRINGE: INTRAMUSCULAR | Status: DC | PRN

## 2017-07-10 MED ORDER — NOREPINEPHRINE BITARTRATE 1 MG/ML IV SOLN
0.0000 ug/min | INTRAVENOUS | Status: DC
  Filled 2017-07-10: qty 4

## 2017-07-10 MED ORDER — AMIODARONE HCL IN DEXTROSE 360-4.14 MG/200ML-% IV SOLN
INTRAVENOUS | Status: AC
  Administered 2017-07-10: 150 mg via INTRAVENOUS
  Filled 2017-07-10: qty 200

## 2017-07-10 MED ORDER — ROCURONIUM BROMIDE 10 MG/ML (PF) SYRINGE
PREFILLED_SYRINGE | INTRAVENOUS | Status: DC | PRN
  Administered 2017-07-10: 60 mg via INTRAVENOUS
  Administered 2017-07-10: 50 mg via INTRAVENOUS
  Administered 2017-07-10: 40 mg via INTRAVENOUS

## 2017-07-10 MED ORDER — EPHEDRINE SULFATE 50 MG/ML IJ SOLN
INTRAMUSCULAR | Status: DC | PRN
  Administered 2017-07-10 (×2): 10 mg via INTRAVENOUS

## 2017-07-10 MED ORDER — MORPHINE SULFATE (PF) 4 MG/ML IV SOLN
2.0000 mg | INTRAVENOUS | Status: DC | PRN
  Administered 2017-07-10: 2 mg via INTRAVENOUS
  Administered 2017-07-11: 4 mg via INTRAVENOUS
  Filled 2017-07-10 (×2): qty 1

## 2017-07-10 MED ORDER — MIDAZOLAM HCL 5 MG/5ML IJ SOLN
INTRAMUSCULAR | Status: DC | PRN
  Administered 2017-07-10 (×2): 3 mg via INTRAVENOUS
  Administered 2017-07-10: 1 mg via INTRAVENOUS
  Administered 2017-07-10: 2 mg via INTRAVENOUS
  Administered 2017-07-10: 1 mg via INTRAVENOUS

## 2017-07-10 MED ORDER — POTASSIUM CHLORIDE 10 MEQ/50ML IV SOLN
10.0000 meq | INTRAVENOUS | Status: AC
  Administered 2017-07-10 (×3): 10 meq via INTRAVENOUS
  Filled 2017-07-10 (×3): qty 50

## 2017-07-10 MED ORDER — DOCUSATE SODIUM 100 MG PO CAPS: 200.0000 mg | ORAL_CAPSULE | Freq: Every day | ORAL | Status: DC

## 2017-07-10 MED ORDER — MILRINONE LACTATE IN DEXTROSE 20-5 MG/100ML-% IV SOLN: 0.2500 ug/kg/min | INTRAVENOUS | Status: DC

## 2017-07-10 MED ORDER — TRAMADOL HCL 50 MG PO TABS: 50.0000 mg | ORAL_TABLET | ORAL | Status: DC | PRN

## 2017-07-10 MED ORDER — MAGNESIUM SULFATE 4 GM/100ML IV SOLN
4.0000 g | Freq: Once | INTRAVENOUS | Status: AC
  Administered 2017-07-10: 4 g via INTRAVENOUS
  Filled 2017-07-10: qty 100

## 2017-07-10 MED ORDER — LACTATED RINGERS IV SOLN
500.0000 mL | Freq: Once | INTRAVENOUS | Status: AC | PRN
  Administered 2017-07-10: 500 mL via INTRAVENOUS

## 2017-07-10 MED ORDER — POTASSIUM CHLORIDE 10 MEQ/50ML IV SOLN
10.0000 meq | INTRAVENOUS | Status: AC
  Administered 2017-07-10 (×2): 10 meq via INTRAVENOUS

## 2017-07-10 MED ORDER — PROPOFOL 10 MG/ML IV BOLUS
INTRAVENOUS | Status: DC | PRN
  Administered 2017-07-10 (×3): 50 mg via INTRAVENOUS
  Administered 2017-07-10: 100 mg via INTRAVENOUS

## 2017-07-10 MED ORDER — SODIUM CHLORIDE 0.9 % IJ SOLN
OROMUCOSAL | Status: DC | PRN
  Administered 2017-07-10 (×4): 4 mL via TOPICAL

## 2017-07-10 MED ORDER — SODIUM CHLORIDE 0.9 % IV SOLN: 250.0000 mL | INTRAVENOUS | Status: DC

## 2017-07-10 MED ORDER — AMIODARONE HCL IN DEXTROSE 360-4.14 MG/200ML-% IV SOLN
60.0000 mg/h | INTRAVENOUS | Status: DC
  Administered 2017-07-10 (×2): 60 mg/h via INTRAVENOUS
  Filled 2017-07-10: qty 200

## 2017-07-10 MED ORDER — ACETAMINOPHEN 500 MG PO TABS: 1000.0000 mg | ORAL_TABLET | Freq: Four times a day (QID) | ORAL | Status: DC

## 2017-07-10 MED ORDER — POTASSIUM CHLORIDE 10 MEQ/50ML IV SOLN
10.0000 meq | INTRAVENOUS | Status: AC
  Administered 2017-07-10 (×3): 10 meq via INTRAVENOUS
  Filled 2017-07-10 (×2): qty 50

## 2017-07-10 MED ORDER — SODIUM CHLORIDE 0.9% FLUSH
10.0000 mL | Freq: Two times a day (BID) | INTRAVENOUS | Status: DC
  Administered 2017-07-10: 10 mL

## 2017-07-10 MED ORDER — LEVOTHYROXINE SODIUM 112 MCG PO TABS
112.0000 ug | ORAL_TABLET | ORAL | Status: DC
  Administered 2017-07-13 – 2017-07-15 (×2): 112 ug via ORAL
  Filled 2017-07-10 (×2): qty 1

## 2017-07-10 MED ORDER — OXYCODONE HCL 5 MG PO TABS: 5.0000 mg | ORAL_TABLET | ORAL | Status: DC | PRN

## 2017-07-10 MED ORDER — MORPHINE SULFATE (PF) 2 MG/ML IV SOLN: 1.0000 mg | INTRAVENOUS | Status: DC | PRN

## 2017-07-10 MED ORDER — CHLORHEXIDINE GLUCONATE CLOTH 2 % EX PADS
6.0000 | MEDICATED_PAD | Freq: Every day | CUTANEOUS | Status: DC
  Administered 2017-07-11 – 2017-07-13 (×3): 6 via TOPICAL

## 2017-07-10 MED ORDER — ACETAMINOPHEN 650 MG RE SUPP
650.0000 mg | Freq: Once | RECTAL | Status: AC
  Administered 2017-07-10: 650 mg via RECTAL

## 2017-07-10 MED ORDER — PROTAMINE SULFATE 10 MG/ML IV SOLN
25.0000 mg | Freq: Once | INTRAVENOUS | Status: AC
  Administered 2017-07-10: 25 mg via INTRAVENOUS
  Filled 2017-07-10: qty 5

## 2017-07-10 MED ORDER — MILRINONE LACTATE IN DEXTROSE 20-5 MG/100ML-% IV SOLN
0.1250 ug/kg/min | INTRAVENOUS | Status: AC
  Administered 2017-07-10: .25 mg/kg/min via INTRAVENOUS
  Filled 2017-07-10: qty 100

## 2017-07-10 MED ORDER — SODIUM CHLORIDE 0.45 % IV SOLN
INTRAVENOUS | Status: DC | PRN
  Administered 2017-07-10: 19:00:00 via INTRAVENOUS

## 2017-07-10 MED ORDER — SODIUM CHLORIDE 0.9 % IV SOLN
0.0000 ug/kg/h | INTRAVENOUS | Status: DC
  Administered 2017-07-10 – 2017-07-11 (×3): 0.7 ug/kg/h via INTRAVENOUS
  Administered 2017-07-11: 08:00:00 via INTRAVENOUS
  Filled 2017-07-10 (×6): qty 2

## 2017-07-10 MED ORDER — SODIUM CHLORIDE 0.9 % IV SOLN: INTRAVENOUS | Status: DC

## 2017-07-10 MED ORDER — SODIUM CHLORIDE 0.9 % IV SOLN: Freq: Once | INTRAVENOUS | Status: DC

## 2017-07-10 MED ORDER — MORPHINE SULFATE (PF) 4 MG/ML IV SOLN
1.0000 mg | INTRAVENOUS | Status: AC | PRN
  Administered 2017-07-11 (×2): 4 mg via INTRAVENOUS
  Filled 2017-07-10 (×2): qty 1

## 2017-07-10 MED ORDER — HEPARIN SODIUM (PORCINE) 1000 UNIT/ML IJ SOLN
INTRAMUSCULAR | Status: DC | PRN
  Administered 2017-07-10: 21000 [IU] via INTRAVENOUS
  Administered 2017-07-10: 4000 [IU] via INTRAVENOUS

## 2017-07-10 MED ORDER — LACTATED RINGERS IV SOLN
INTRAVENOUS | Status: DC | PRN
  Administered 2017-07-10 (×2): via INTRAVENOUS

## 2017-07-10 MED ORDER — METOPROLOL TARTRATE 25 MG/10 ML ORAL SUSPENSION
12.5000 mg | Freq: Two times a day (BID) | ORAL | Status: DC
  Filled 2017-07-10: qty 5

## 2017-07-10 MED ORDER — VASOPRESSIN 20 UNIT/ML IV SOLN
0.0300 [IU]/min | INTRAVENOUS | Status: DC
  Filled 2017-07-10: qty 2

## 2017-07-10 MED ORDER — ACETAMINOPHEN 160 MG/5ML PO SOLN
1000.0000 mg | Freq: Four times a day (QID) | ORAL | Status: DC
  Administered 2017-07-11 – 2017-07-13 (×7): 1000 mg
  Filled 2017-07-10 (×7): qty 40.6

## 2017-07-10 MED ORDER — PANTOPRAZOLE SODIUM 40 MG PO TBEC: 40.0000 mg | DELAYED_RELEASE_TABLET | Freq: Every day | ORAL | Status: DC

## 2017-07-10 MED ORDER — MILRINONE LACTATE IN DEXTROSE 20-5 MG/100ML-% IV SOLN
0.2500 ug/kg/min | INTRAVENOUS | Status: DC
  Administered 2017-07-11: 0.25 ug/kg/min via INTRAVENOUS
  Filled 2017-07-10: qty 100

## 2017-07-10 MED ORDER — ALBUMIN HUMAN 5 % IV SOLN
250.0000 mL | INTRAVENOUS | Status: AC | PRN
  Administered 2017-07-10 (×4): 250 mL via INTRAVENOUS
  Filled 2017-07-10 (×2): qty 250

## 2017-07-10 MED ORDER — LEVOTHYROXINE SODIUM 100 MCG PO TABS
100.0000 ug | ORAL_TABLET | ORAL | Status: DC
  Administered 2017-07-12 – 2017-07-16 (×3): 100 ug via ORAL
  Filled 2017-07-10 (×4): qty 1

## 2017-07-10 MED ORDER — FENTANYL CITRATE (PF) 250 MCG/5ML IJ SOLN
INTRAMUSCULAR | Status: AC
  Filled 2017-07-10: qty 5

## 2017-07-10 MED ORDER — MIDAZOLAM HCL 2 MG/2ML IJ SOLN
2.0000 mg | INTRAMUSCULAR | Status: DC | PRN
  Administered 2017-07-10 – 2017-07-11 (×9): 2 mg via INTRAVENOUS
  Filled 2017-07-10 (×11): qty 2

## 2017-07-10 MED ORDER — MORPHINE SULFATE (PF) 2 MG/ML IV SOLN: 2.0000 mg | INTRAVENOUS | Status: DC | PRN

## 2017-07-10 MED ORDER — LACTATED RINGERS IV SOLN: INTRAVENOUS | Status: DC

## 2017-07-10 MED ORDER — PHENYLEPHRINE HCL 10 MG/ML IJ SOLN
INTRAMUSCULAR | Status: DC | PRN
  Administered 2017-07-10 (×2): 40 ug via INTRAVENOUS

## 2017-07-10 MED ORDER — NITROGLYCERIN 0.2 MG/ML ON CALL CATH LAB
INTRAVENOUS | Status: DC | PRN
  Administered 2017-07-10 (×3): 20 ug via INTRAVENOUS
  Administered 2017-07-10: 40 ug via INTRAVENOUS
  Administered 2017-07-10: 20 ug via INTRAVENOUS

## 2017-07-10 MED ORDER — AMIODARONE HCL IN DEXTROSE 360-4.14 MG/200ML-% IV SOLN
30.0000 mg/h | INTRAVENOUS | Status: DC
  Administered 2017-07-11: 07:00:00 via INTRAVENOUS
  Filled 2017-07-10 (×3): qty 200

## 2017-07-10 SURGICAL SUPPLY — 124 items
ADAPTER CARDIO PERF ANTE/RETRO (ADAPTER) ×4 IMPLANT
ADH SRG 12 PREFL SYR 3 SPRDR (MISCELLANEOUS)
ADPR PRFSN 84XANTGRD RTRGD (ADAPTER) ×2
APL SRG 7X2 LUM MLBL SLNT (VASCULAR PRODUCTS) ×4
APPLICATOR TIP COSEAL (VASCULAR PRODUCTS) ×4 IMPLANT
ATTRACTOMAT 16X20 MAGNETIC DRP (DRAPES) ×2 IMPLANT
BAG DECANTER FOR FLEXI CONT (MISCELLANEOUS) ×4 IMPLANT
BLADE 15 SAFETY STRL DISP (BLADE) ×2 IMPLANT
BLADE STERNUM SYSTEM 6 (BLADE) ×4 IMPLANT
BLADE SURG 12 STRL SS (BLADE) ×4 IMPLANT
BLADE SURG 15 STRL LF DISP TIS (BLADE) ×2 IMPLANT
BLADE SURG 15 STRL SS (BLADE) ×4
CANISTER SUCT 3000ML PPV (MISCELLANEOUS) ×4 IMPLANT
CANNULA GRAFT 8MMX50CM (Graft) ×2 IMPLANT
CANNULA GUNDRY RCSP 15FR (MISCELLANEOUS) ×6 IMPLANT
CANNULA SUMP PERICARDIAL (CANNULA) ×2 IMPLANT
CATH CPB KIT VANTRIGT (MISCELLANEOUS) ×4 IMPLANT
CATH HEART VENT LEFT (CATHETERS) ×2 IMPLANT
CATH RETROPLEGIA CORONARY 14FR (CATHETERS) IMPLANT
CATH ROBINSON RED A/P 18FR (CATHETERS) ×6 IMPLANT
CATH THORACIC 36FR RT ANG (CATHETERS) ×4 IMPLANT
CATH/SQUID NICHOLS JEHLE COR (CATHETERS) ×2 IMPLANT
CAUTERY EYE LOW TEMP 1300F FIN (OPHTHALMIC RELATED) ×4 IMPLANT
CLIP FOGARTY SPRING 6M (CLIP) IMPLANT
CLIP VESOCCLUDE MED 24/CT (CLIP) ×2 IMPLANT
CONT SPEC 4OZ CLIKSEAL STRL BL (MISCELLANEOUS) ×8 IMPLANT
COVER SURGICAL LIGHT HANDLE (MISCELLANEOUS) ×4 IMPLANT
CRADLE DONUT ADULT HEAD (MISCELLANEOUS) ×4 IMPLANT
DRAIN CHANNEL 32F RND 10.7 FF (WOUND CARE) ×4 IMPLANT
DRAPE CARDIOVASCULAR INCISE (DRAPES) ×4
DRAPE SLUSH/WARMER DISC (DRAPES) ×4 IMPLANT
DRAPE SRG 135X102X78XABS (DRAPES) ×2 IMPLANT
DRSG AQUACEL AG ADV 3.5X14 (GAUZE/BANDAGES/DRESSINGS) ×4 IMPLANT
ELECT BLADE 4.0 EZ CLEAN MEGAD (MISCELLANEOUS) ×4
ELECT BLADE 6.5 EXT (BLADE) ×4 IMPLANT
ELECT CAUTERY BLADE 6.4 (BLADE) ×4 IMPLANT
ELECT REM PT RETURN 9FT ADLT (ELECTROSURGICAL) ×8
ELECTRODE BLDE 4.0 EZ CLN MEGD (MISCELLANEOUS) ×2 IMPLANT
ELECTRODE REM PT RTRN 9FT ADLT (ELECTROSURGICAL) ×4 IMPLANT
FELT TEFLON 1X6 (MISCELLANEOUS) ×8 IMPLANT
GAUZE SPONGE 4X4 12PLY STRL (GAUZE/BANDAGES/DRESSINGS) ×8 IMPLANT
GAUZE SPONGE 4X4 12PLY STRL LF (GAUZE/BANDAGES/DRESSINGS) ×2 IMPLANT
GLOVE BIO SURGEON STRL SZ7.5 (GLOVE) ×10 IMPLANT
GLOVE BIOGEL PI IND STRL 6 (GLOVE) IMPLANT
GLOVE BIOGEL PI INDICATOR 6 (GLOVE) ×12
GLOVE INDICATOR 7.5 STRL GRN (GLOVE) ×2 IMPLANT
GOWN STRL REUS W/ TWL LRG LVL3 (GOWN DISPOSABLE) ×8 IMPLANT
GOWN STRL REUS W/TWL LRG LVL3 (GOWN DISPOSABLE) ×36
GRAFT WOVEN D/V 30DX30L (Vascular Products) ×2 IMPLANT
HANDLE STAPLE ENDO GIA SHORT (STAPLE) ×1
HEMOSTAT POWDER SURGIFOAM 1G (HEMOSTASIS) ×12 IMPLANT
HEMOSTAT SURGICEL 2X14 (HEMOSTASIS) ×6 IMPLANT
INSERT FOGARTY XLG (MISCELLANEOUS) ×2 IMPLANT
KIT BASIN OR (CUSTOM PROCEDURE TRAY) ×4 IMPLANT
KIT ROOM TURNOVER OR (KITS) ×4 IMPLANT
KIT SUCTION CATH 14FR (SUCTIONS) ×10 IMPLANT
LEAD PACING MYOCARDI (MISCELLANEOUS) ×6 IMPLANT
LINE VENT (MISCELLANEOUS) ×2 IMPLANT
LOOP VESSEL MAXI BLUE (MISCELLANEOUS) ×2 IMPLANT
LOOP VESSEL MINI RED (MISCELLANEOUS) ×2 IMPLANT
MARKER GRAFT CORONARY BYPASS (MISCELLANEOUS) ×6 IMPLANT
NS IRRIG 1000ML POUR BTL (IV SOLUTION) ×24 IMPLANT
PACK OPEN HEART (CUSTOM PROCEDURE TRAY) ×4 IMPLANT
PAD ARMBOARD 7.5X6 YLW CONV (MISCELLANEOUS) ×8 IMPLANT
PENCIL BUTTON HOLSTER BLD 10FT (ELECTRODE) IMPLANT
RELOAD TRI 2.0 30 VAS MED SUL (STAPLE) ×2 IMPLANT
SET CARDIOPLEGIA MPS 5001102 (MISCELLANEOUS) ×2 IMPLANT
SLEEVE SURGEON STRL (DRAPES) ×2 IMPLANT
SPONGE LAP 18X18 X RAY DECT (DISPOSABLE) ×4 IMPLANT
SPONGE LAP 4X18 X RAY DECT (DISPOSABLE) ×2 IMPLANT
STAPLER ENDO GIA 12 SHRT THIN (STAPLE) IMPLANT
STAPLER ENDO GIA 12MM SHORT (STAPLE) ×3 IMPLANT
STAPLER VISISTAT 35W (STAPLE) ×2 IMPLANT
SURGIFLO W/THROMBIN 8M KIT (HEMOSTASIS) ×2 IMPLANT
SUT BONE WAX W31G (SUTURE) ×4 IMPLANT
SUT ETHIBON 2 0 V 52N 30 (SUTURE) ×12 IMPLANT
SUT ETHIBON EXCEL 2-0 V-5 (SUTURE) IMPLANT
SUT ETHIBOND 2 0 SH (SUTURE) ×4
SUT ETHIBOND 2 0 SH 36X2 (SUTURE) ×2 IMPLANT
SUT ETHIBOND 2 0 V4 (SUTURE) IMPLANT
SUT ETHIBOND 2 0V4 GREEN (SUTURE) IMPLANT
SUT ETHIBOND 4 0 RB 1 (SUTURE) IMPLANT
SUT ETHIBOND V-5 VALVE (SUTURE) ×4 IMPLANT
SUT PROLENE 3 0 RB 1 (SUTURE) ×4 IMPLANT
SUT PROLENE 3 0 SH 1 (SUTURE) IMPLANT
SUT PROLENE 3 0 SH DA (SUTURE) IMPLANT
SUT PROLENE 4 0 RB 1 (SUTURE) ×148
SUT PROLENE 4 0 SH DA (SUTURE) ×26 IMPLANT
SUT PROLENE 4 0 TF (SUTURE) IMPLANT
SUT PROLENE 4-0 RB1 .5 CRCL 36 (SUTURE) ×6 IMPLANT
SUT PROLENE 5 0 C 1 36 (SUTURE) ×10 IMPLANT
SUT PROLENE 6 0 C 1 30 (SUTURE) ×2 IMPLANT
SUT PROLENE 6 0 CC (SUTURE) ×2 IMPLANT
SUT SILK  1 MH (SUTURE) ×4
SUT SILK 1 MH (SUTURE) ×4 IMPLANT
SUT SILK 1 TIES 10X30 (SUTURE) ×4 IMPLANT
SUT SILK 2 0 (SUTURE) ×4
SUT SILK 2 0 SH CR/8 (SUTURE) ×10 IMPLANT
SUT SILK 2-0 18XBRD TIE 12 (SUTURE) ×2 IMPLANT
SUT SILK 3 0 SH CR/8 (SUTURE) ×4 IMPLANT
SUT SILK 4 0 (SUTURE) ×4
SUT SILK 4-0 18XBRD TIE 12 (SUTURE) ×2 IMPLANT
SUT STEEL 6MS V (SUTURE) ×8 IMPLANT
SUT STEEL SZ 6 DBL 3X14 BALL (SUTURE) ×4 IMPLANT
SUT TEM PAC WIRE 2 0 SH (SUTURE) ×16 IMPLANT
SUT VIC AB 1 CTX 18 (SUTURE) ×2 IMPLANT
SUT VIC AB 1 CTX 36 (SUTURE) ×12
SUT VIC AB 1 CTX36XBRD ANBCTR (SUTURE) ×4 IMPLANT
SUT VIC AB 2-0 CT1 27 (SUTURE) ×4
SUT VIC AB 2-0 CT1 TAPERPNT 27 (SUTURE) IMPLANT
SUT VIC AB 2-0 CTX 27 (SUTURE) ×8 IMPLANT
SUT VIC AB 3-0 X1 27 (SUTURE) IMPLANT
SYR 10ML KIT SKIN ADHESIVE (MISCELLANEOUS) IMPLANT
SYSTEM SAHARA CHEST DRAIN ATS (WOUND CARE) ×4 IMPLANT
TAPE CLOTH SURG 6X10 WHT LF (GAUZE/BANDAGES/DRESSINGS) ×2 IMPLANT
TAPE PAPER 3X10 WHT MICROPORE (GAUZE/BANDAGES/DRESSINGS) ×2 IMPLANT
TOWEL GREEN STERILE (TOWEL DISPOSABLE) ×4 IMPLANT
TOWEL GREEN STERILE FF (TOWEL DISPOSABLE) ×2 IMPLANT
TRAY FOLEY SILVER 14FR TEMP (SET/KITS/TRAYS/PACK) ×4 IMPLANT
TRAY FOLEY SILVER 16FR TEMP (SET/KITS/TRAYS/PACK) ×2 IMPLANT
UNDERPAD 30X30 (UNDERPADS AND DIAPERS) ×4 IMPLANT
VALVE MAGNA EASE 21MM (Prosthesis & Implant Heart) ×2 IMPLANT
VENT LEFT HEART 12002 (CATHETERS) ×4
WATER STERILE IRR 1000ML POUR (IV SOLUTION) ×8 IMPLANT

## 2017-07-10 NOTE — OR Nursing (Signed)
Patient has a latex allergy. Doc was notified and wore his regular latex gloves.

## 2017-07-10 NOTE — Anesthesia Procedure Notes (Addendum)
Procedure Name: Intubation Date/Time: 07/10/2017 7:30 AM Performed by: White, Amedeo Plenty, CRNA Pre-anesthesia Checklist: Patient identified, Emergency Drugs available, Suction available, Patient being monitored and Timeout performed Patient Re-evaluated:Patient Re-evaluated prior to induction Oxygen Delivery Method: Circle system utilized Preoxygenation: Pre-oxygenation with 100% oxygen Induction Type: IV induction Ventilation: Mask ventilation without difficulty Laryngoscope Size: Mac and 4 Grade View: Grade I Tube type: Oral Tube size: 7.5 mm Number of attempts: 1 Airway Equipment and Method: Rigid stylet Placement Confirmation: ETT inserted through vocal cords under direct vision,  positive ETCO2 and breath sounds checked- equal and bilateral Secured at: 21 cm Tube secured with: Tape Dental Injury: Teeth and Oropharynx as per pre-operative assessment  Comments: Intubation by [name], SRNA

## 2017-07-10 NOTE — Progress Notes (Addendum)
Coox sent at 1800 per MD. Results pending.  Coox results 76

## 2017-07-10 NOTE — Anesthesia Procedure Notes (Signed)
Arterial Line Insertion Start/End1/14/2019 7:05 AM Performed by: White, Amedeo Plenty, Immunologist, CRNA  Preanesthetic checklist: patient identified, IV checked, site marked, risks and benefits discussed, surgical consent, monitors and equipment checked, pre-op evaluation, timeout performed and anesthesia consent Lidocaine 1% used for infiltration and patient sedated radial was placed Catheter size: 20 G Hand hygiene performed  and maximum sterile barriers used   Attempts: 1 (attempt x2 by [name], SRNA ) Procedure performed without using ultrasound guided technique. Following insertion, dressing applied and Biopatch. Post procedure assessment: normal  Patient tolerated the procedure well with no immediate complications.

## 2017-07-10 NOTE — Progress Notes (Signed)
  Echocardiogram Echocardiogram Transesophageal has been performed.  [name] 07/10/2017, 8:47 AM

## 2017-07-10 NOTE — Anesthesia Procedure Notes (Signed)
Central Venous Catheter Insertion Performed by: Roderic Palau, MD, anesthesiologist Start/End1/14/2019 6:30 AM, 07/10/2017 6:40 AM Patient location: Pre-op. Preanesthetic checklist: patient identified, IV checked, site marked, risks and benefits discussed, surgical consent, monitors and equipment checked, pre-op evaluation, timeout performed and anesthesia consent Position: Trendelenburg Lidocaine 1% used for infiltration and patient sedated Hand hygiene performed , maximum sterile barriers used  and Seldinger technique used Catheter size: 9 Fr Total catheter length 10. Central line was placed.MAC introducer Swan type:thermodilution Procedure performed using ultrasound guided technique. Ultrasound Notes:anatomy identified, needle tip was noted to be adjacent to the nerve/plexus identified, no ultrasound evidence of intravascular and/or intraneural injection and image(s) printed for medical record Attempts: 1 Following insertion, line sutured, dressing applied and Biopatch. Post procedure assessment: blood return through all ports, free fluid flow and no air  Patient tolerated the procedure well with no immediate complications.

## 2017-07-10 NOTE — Brief Op Note (Signed)
07/10/2017  12:33 PM  PATIENT:  [name]  77 y.o. female  PRE-OPERATIVE DIAGNOSIS:  TAA AI  POST-OPERATIVE DIAGNOSIS:  TAA AI  PROCEDURE:  Procedure(s): AORTIC VALVE REPLACEMENT (AVR) (N/A) REPLACEMENT ASCENDING AORTA (N/A) TRANSESOPHAGEAL ECHOCARDIOGRAM (TEE) (N/A)  SURGEON:  Surgeon(s) and Role:    Ivin Poot, MD - Primary  PHYSICIAN ASSISTANT: WAYNE GOLD PA-C  ANESTHESIA:   general  EBL:   BLOOD ADMINISTERED:  DRAINS: PERICARDIAL AND MEDIASTNAL TUBES   LOCAL MEDICATIONS USED:  NONE  SPECIMEN:  Source of Specimen:  AORTIC VALVE LEAFLETS  DISPOSITION OF SPECIMEN:  PATHOLOGY  COUNTS:  YES  TOURNIQUET:  * No tourniquets in log *  DICTATION: .Other Dictation: Dictation Number [id number]  PLAN OF CARE: Admit to inpatient   PATIENT DISPOSITION:  ICU - intubated and hemodynamically stable.   Delay start of Pharmacological VTE agent (>24hrs) due to surgical blood loss or risk of bleeding: yes  COMPLICATIONS: NO KNOWN

## 2017-07-10 NOTE — Code Documentation (Signed)
  Patient Name: [name]   MRN: [id number]   Date of Birth/ Sex: [birth year] , female      Admission Date: 07/10/2017  Attending Provider: Ivin Poot, MD  Primary Diagnosis: <principal problem not specified>   Indication: Pt was in her usual state of health until this PM, when she went into v fib after her pacemaker was turned off. Code blue was subsequently called. At the time of arrival on scene, ACLS protocol was underway and ROSC was achieved.   Technical Description:  - CPR performance duration:  5-6 minutes  - Was defibrillation or cardioversion used? Yes   - Was external pacer placed? No  - Was patient intubated pre/post CPR? Already intubated   Medications Administered: Y = Yes; Blank = No Amiodarone  Y  Atropine    Calcium    Epinephrine    Lidocaine  Y  Magnesium    Norepinephrine    Phenylephrine    Sodium bicarbonate  Y  Vasopressin     Post CPR evaluation:  - Final Status - Was patient successfully resuscitated ? Yes - What is current rhythm? Sinus rhythm - What is current hemodynamic status? stable  Miscellaneous Information:  - Labs sent, including: I-stat 4, CBG, CBC  - Primary team notified?  Yes  - Family Notified? Yes  - Additional notes/ transfer status: Will stay in Cardiac ICU     Kathrene Alu, MD  07/10/2017, 8:06 PM

## 2017-07-10 NOTE — Progress Notes (Signed)
1955 (by room clock) RN was rezeroing/leveling/calibrating lines, when patient aline BP was noted to drop and pattient subsequently went into ventricular fibrillation arrest.  Compressions were started and Code Blue was initiated.  Patient was shocked at [place] and had return of spontaneous circulation with normal sinus rhythm.  Dr. [name] was made aware.  Orders for lidocaine push, 1 amp of bicarb, and and amiodarone bolus + infusion at 60 mg were ordered along with STAT CXR, and labs.  Some albumin was given along with LR, but MD told RN to hold off on fluids at this time.  CVP WNL.  Patient's O2 saturation on monitor was low 90s, so the ventilator FIO2 was increased to 100%.  CI 1.5 Labs K 3.7 -- replacement protocol initiated Mag 2.7   PH 7.43 PCO2 49.6 PaO2 60 Bicarb 33.2 BeB 8   Coox 72  Hgb 10.4 HCT 31.2 PLT 176   Chest tube output minimal  UOP excellent   Dr. [name] gave orders for Epi infusion and orders to wean Neo to Levo. He asked RN to call if Hgb < 8 BeB > -5 Coox <60%  Hgb Q4  ABG Q4 COOX q4  Prior to cardiac arrest, upon assesment, RN noted epicardial pacer was pacing near/on "T" wave at times.  RN turned off pacer.  Patients underlying rhythm was NSR in 80s.  MD instructed there to be no use of the epicardial pacer for this reason.

## 2017-07-10 NOTE — Transfer of Care (Signed)
Immediate Anesthesia Transfer of Care Note  Patient: [name]  Procedure(s) Performed: AORTIC VALVE REPLACEMENT (AVR) (N/A Chest) REPLACEMENT ASCENDING AORTA (N/A Chest) TRANSESOPHAGEAL ECHOCARDIOGRAM (TEE) (N/A )  Patient Location: ICU  Anesthesia Type:General  Level of Consciousness: Patient remains intubated per anesthesia plan  Airway & Oxygen Therapy: Patient remains intubated per anesthesia plan and Patient placed on Ventilator (see vital sign flow sheet for setting)  Post-op Assessment: Report given to RN and Post -op Vital signs reviewed and stable  Post vital signs: Reviewed and stable  Last Vitals:  Vitals:   07/10/17 0545  BP: (!) 149/51  Pulse: 60  Resp: 18  Temp: 36.6 C  SpO2: 100%    Last Pain:  Vitals:   07/10/17 0545  TempSrc: Oral      Patients Stated Pain Goal: 2 (73/71/06 2694)  Complications: No apparent anesthesia complications

## 2017-07-10 NOTE — Progress Notes (Signed)
CT surgery p.m. Rounds  Status post aVR and replacement of fusiform aneurysm 5.2 cm Postoperative coagulopathy Chest tube output now satisfactory after product administration and small dose of NovoSeven Patient still sedated Follow-up chest x-ray with mild edema-good diuresis with 10 mg of Lasix Will follow serial hemoglobin to maintain hemoglobin greater than 8.0 Plan on ventilator wean and possible extubation in a.m.

## 2017-07-10 NOTE — Progress Notes (Signed)
Pre Procedure note for inpatients:   [name] has been scheduled for Procedure(s): AORTIC VALVE REPLACEMENT (AVR) (N/A) REPLACEMENT ASCENDING AORTA (N/A) TRANSESOPHAGEAL ECHOCARDIOGRAM (TEE) (N/A) today. The various methods of treatment have been discussed with the patient. After consideration of the risks, benefits and treatment options the patient has consented to the planned procedure.   The patient has been seen and labs reviewed. There are no changes in the patient's condition to prevent proceeding with the planned procedure today.  Recent labs:  Lab Results  Component Value Date   WBC 5.4 07/06/2017   HGB 11.1 (L) 07/06/2017   HCT 37.1 07/06/2017   PLT 265 07/06/2017   GLUCOSE 104 (H) 07/06/2017   CHOL 165 03/15/2017   TRIG 93 03/15/2017   HDL 57 03/15/2017   LDLCALC 89 03/15/2017   ALT 13 (L) 07/06/2017   AST 25 07/06/2017   NA 138 07/06/2017   K 3.7 07/06/2017   CL 101 07/06/2017   CREATININE 0.56 07/06/2017   BUN 11 07/06/2017   CO2 23 07/06/2017   TSH 0.990 03/15/2017   INR 1.03 07/06/2017   HGBA1C 5.2 07/06/2017    Ivin Poot III, MD 07/10/2017 7:16 AM

## 2017-07-10 NOTE — Progress Notes (Signed)
Attempted to call patients spouse Laverna Peace, phone is off or no longer in service. Patient daughter Tomi Bamberger called and RN left voicemail. RN will continue to monitor.

## 2017-07-10 NOTE — Anesthesia Procedure Notes (Signed)
Central Venous Catheter Insertion Performed by: Roderic Palau, MD, anesthesiologist Start/End1/14/2019 6:30 AM, 07/10/2017 6:40 AM Patient location: Pre-op. Preanesthetic checklist: patient identified, IV checked, site marked, risks and benefits discussed, surgical consent, monitors and equipment checked, pre-op evaluation, timeout performed and anesthesia consent Hand hygiene performed  and maximum sterile barriers used  PA cath was placed.[place] type:thermodilution Procedure performed without using ultrasound guided technique. Attempts: 1 Patient tolerated the procedure well with no immediate complications.

## 2017-07-11 ENCOUNTER — Inpatient Hospital Stay (HOSPITAL_COMMUNITY): Payer: Medicare Other

## 2017-07-11 ENCOUNTER — Inpatient Hospital Stay (HOSPITAL_COMMUNITY): Payer: Medicare Other | Admitting: Certified Registered"

## 2017-07-11 ENCOUNTER — Encounter (HOSPITAL_COMMUNITY): Payer: Self-pay | Admitting: Cardiothoracic Surgery

## 2017-07-11 ENCOUNTER — Encounter (HOSPITAL_COMMUNITY)
Admission: RE | Disposition: A | Discharge status: Rehabilitation Facility | Payer: Self-pay | Source: Ambulatory Visit | Attending: Cardiothoracic Surgery

## 2017-07-11 DIAGNOSIS — Z95811 Presence of heart assist device: Secondary | ICD-10-CM

## 2017-07-11 DIAGNOSIS — I97638 Postprocedural hematoma of a circulatory system organ or structure following other circulatory system procedure: Secondary | ICD-10-CM

## 2017-07-11 DIAGNOSIS — Z952 Presence of prosthetic heart valve: Secondary | ICD-10-CM

## 2017-07-11 DIAGNOSIS — I509 Heart failure, unspecified: Secondary | ICD-10-CM

## 2017-07-11 DIAGNOSIS — R570 Cardiogenic shock: Secondary | ICD-10-CM

## 2017-07-11 HISTORY — PX: TEE WITHOUT CARDIOVERSION: SHX5443

## 2017-07-11 HISTORY — PX: EXPLORATION POST OPERATIVE OPEN HEART: SHX5061

## 2017-07-11 HISTORY — PX: PLACEMENT OF CENTRIMAG VENTRICULAR ASSIST DEVICE: SHX6226

## 2017-07-11 HISTORY — PX: HEMATOMA EVACUATION: SHX5118

## 2017-07-11 LAB — BPAM PLATELET PHERESIS
BLOOD PRODUCT EXPIRATION DATE: 201901142359
Blood Product Expiration Date: 201901151550
ISSUE DATE / TIME: 201901141159
ISSUE DATE / TIME: 201901141612
UNIT TYPE AND RH: 6200
Unit Type and Rh: 6200

## 2017-07-11 LAB — POCT I-STAT, CHEM 8
BUN: 11 mg/dL (ref 6–20)
BUN: 9 mg/dL (ref 6–20)
BUN: 16 mg/dL (ref 6–20)
BUN: 12 mg/dL (ref 6–20)
Calcium, Ion: 1.04 mmol/L — ABNORMAL LOW (ref 1.15–1.40)
Calcium, Ion: 1.15 mmol/L (ref 1.15–1.40)
Calcium, Ion: 1.13 mmol/L — ABNORMAL LOW (ref 1.15–1.40)
Calcium, Ion: 1.17 mmol/L (ref 1.15–1.40)
Chloride: 102 mmol/L (ref 101–111)
Chloride: 106 mmol/L (ref 101–111)
Chloride: 102 mmol/L (ref 101–111)
Chloride: 103 mmol/L (ref 101–111)
Creatinine, Ser: 0.6 mg/dL (ref 0.44–1.00)
Creatinine, Ser: 0.6 mg/dL (ref 0.44–1.00)
Creatinine, Ser: 0.8 mg/dL (ref 0.44–1.00)
Creatinine, Ser: 0.8 mg/dL (ref 0.44–1.00)
Glucose, Bld: 126 mg/dL — ABNORMAL HIGH (ref 65–99)
Glucose, Bld: 174 mg/dL — ABNORMAL HIGH (ref 65–99)
Glucose, Bld: 130 mg/dL — ABNORMAL HIGH (ref 65–99)
Glucose, Bld: 164 mg/dL — ABNORMAL HIGH (ref 65–99)
HCT: 27 % — ABNORMAL LOW (ref 36.0–46.0)
HCT: 36 % (ref 36.0–46.0)
HCT: 22 % — ABNORMAL LOW (ref 36.0–46.0)
HCT: 28 % — ABNORMAL LOW (ref 36.0–46.0)
Hemoglobin: 12.2 g/dL (ref 12.0–15.0)
Hemoglobin: 9.2 g/dL — ABNORMAL LOW (ref 12.0–15.0)
Hemoglobin: 7.5 g/dL — ABNORMAL LOW (ref 12.0–15.0)
Hemoglobin: 9.5 g/dL — ABNORMAL LOW (ref 12.0–15.0)
Potassium: 3.7 mmol/L (ref 3.5–5.1)
Potassium: 3.7 mmol/L (ref 3.5–5.1)
Potassium: 3.8 mmol/L (ref 3.5–5.1)
Potassium: 3.2 mmol/L — ABNORMAL LOW (ref 3.5–5.1)
Sodium: 144 mmol/L (ref 135–145)
Sodium: 149 mmol/L — ABNORMAL HIGH (ref 135–145)
Sodium: 143 mmol/L (ref 135–145)
Sodium: 146 mmol/L — ABNORMAL HIGH (ref 135–145)
TCO2: 26 mmol/L (ref 22–32)
TCO2: 32 mmol/L (ref 22–32)
TCO2: 26 mmol/L (ref 22–32)
TCO2: 26 mmol/L (ref 22–32)

## 2017-07-11 LAB — CBC
HCT: 32.2 % — ABNORMAL LOW (ref 36.0–46.0)
HCT: 29.8 % — ABNORMAL LOW (ref 36.0–46.0)
HCT: 25 % — ABNORMAL LOW (ref 36.0–46.0)
HCT: 25 % — ABNORMAL LOW (ref 36.0–46.0)
Hemoglobin: 10.9 g/dL — ABNORMAL LOW (ref 12.0–15.0)
Hemoglobin: 10.5 g/dL — ABNORMAL LOW (ref 12.0–15.0)
Hemoglobin: 8.7 g/dL — ABNORMAL LOW (ref 12.0–15.0)
Hemoglobin: 8.7 g/dL — ABNORMAL LOW (ref 12.0–15.0)
MCH: 27.9 pg (ref 26.0–34.0)
MCH: 28.8 pg (ref 26.0–34.0)
MCH: 28.2 pg (ref 26.0–34.0)
MCH: 28 pg (ref 26.0–34.0)
MCHC: 33.9 g/dL (ref 30.0–36.0)
MCHC: 35.2 g/dL (ref 30.0–36.0)
MCHC: 34.8 g/dL (ref 30.0–36.0)
MCHC: 34.8 g/dL (ref 30.0–36.0)
MCV: 82.6 fL (ref 78.0–100.0)
MCV: 81.9 fL (ref 78.0–100.0)
MCV: 80.9 fL (ref 78.0–100.0)
MCV: 80.4 fL (ref 78.0–100.0)
Platelets: 200 10*3/uL (ref 150–400)
Platelets: 120 10*3/uL — ABNORMAL LOW (ref 150–400)
Platelets: 105 10*3/uL — ABNORMAL LOW (ref 150–400)
Platelets: 101 10*3/uL — ABNORMAL LOW (ref 150–400)
RBC: 3.9 MIL/uL (ref 3.87–5.11)
RBC: 3.64 MIL/uL — ABNORMAL LOW (ref 3.87–5.11)
RBC: 3.09 MIL/uL — ABNORMAL LOW (ref 3.87–5.11)
RBC: 3.11 MIL/uL — ABNORMAL LOW (ref 3.87–5.11)
RDW: 15 % (ref 11.5–15.5)
RDW: 15.1 % (ref 11.5–15.5)
RDW: 14.8 % (ref 11.5–15.5)
RDW: 15.1 % (ref 11.5–15.5)
WBC: 10.7 10*3/uL — ABNORMAL HIGH (ref 4.0–10.5)
WBC: 9.1 10*3/uL (ref 4.0–10.5)
WBC: 9.3 10*3/uL (ref 4.0–10.5)
WBC: 10.1 10*3/uL (ref 4.0–10.5)

## 2017-07-11 LAB — POCT I-STAT 4, (NA,K, GLUC, HGB,HCT)
Glucose, Bld: 130 mg/dL — ABNORMAL HIGH (ref 65–99)
HCT: 29 % — ABNORMAL LOW (ref 36.0–46.0)
Hemoglobin: 9.9 g/dL — ABNORMAL LOW (ref 12.0–15.0)
Potassium: 2.8 mmol/L — ABNORMAL LOW (ref 3.5–5.1)
Sodium: 147 mmol/L — ABNORMAL HIGH (ref 135–145)

## 2017-07-11 LAB — PREPARE FRESH FROZEN PLASMA
UNIT DIVISION: 0
Unit division: 0
Unit division: 0
Unit division: 0

## 2017-07-11 LAB — DIC (DISSEMINATED INTRAVASCULAR COAGULATION)PANEL
D-Dimer, Quant: 1.06 ug/mL-FEU — ABNORMAL HIGH (ref 0.00–0.50)
Fibrinogen: 287 mg/dL (ref 210–475)
INR: 1.76
Platelets: 124 10*3/uL — ABNORMAL LOW (ref 150–400)
Prothrombin Time: 20.4 seconds — ABNORMAL HIGH (ref 11.4–15.2)
Smear Review: NONE SEEN
aPTT: >200 seconds (ref 24–36)

## 2017-07-11 LAB — POCT I-STAT 3, ART BLOOD GAS (G3+)
Acid-Base Excess: 1 mmol/L (ref 0.0–2.0)
Acid-base deficit: 1 mmol/L (ref 0.0–2.0)
Acid-base deficit: 7 mmol/L — ABNORMAL HIGH (ref 0.0–2.0)
Acid-base deficit: 1 mmol/L (ref 0.0–2.0)
Acid-base deficit: 2 mmol/L (ref 0.0–2.0)
Acid-base deficit: 3 mmol/L — ABNORMAL HIGH (ref 0.0–2.0)
Bicarbonate: 24.5 mmol/L (ref 20.0–28.0)
Bicarbonate: 19 mmol/L — ABNORMAL LOW (ref 20.0–28.0)
Bicarbonate: 25.1 mmol/L (ref 20.0–28.0)
Bicarbonate: 22.8 mmol/L (ref 20.0–28.0)
Bicarbonate: 26.2 mmol/L (ref 20.0–28.0)
Bicarbonate: 23 mmol/L (ref 20.0–28.0)
O2 Saturation: 100 %
O2 Saturation: 94 %
O2 Saturation: 98 %
O2 Saturation: 92 %
O2 Saturation: 97 %
O2 Saturation: 100 %
Patient temperature: 37.1
Patient temperature: 37.6
Patient temperature: 33.3
Patient temperature: 35.8
Patient temperature: 36.4
TCO2: 26 mmol/L (ref 22–32)
TCO2: 20 mmol/L — ABNORMAL LOW (ref 22–32)
TCO2: 27 mmol/L (ref 22–32)
TCO2: 24 mmol/L (ref 22–32)
TCO2: 27 mmol/L (ref 22–32)
TCO2: 24 mmol/L (ref 22–32)
pCO2 arterial: 45.1 mmHg (ref 32.0–48.0)
pCO2 arterial: 38.3 mmHg (ref 32.0–48.0)
pCO2 arterial: 39.6 mmHg (ref 32.0–48.0)
pCO2 arterial: 36.4 mmHg (ref 32.0–48.0)
pCO2 arterial: 41.1 mmHg (ref 32.0–48.0)
pCO2 arterial: 43.2 mmHg (ref 32.0–48.0)
pH, Arterial: 7.343 — ABNORMAL LOW (ref 7.350–7.450)
pH, Arterial: 7.307 — ABNORMAL LOW (ref 7.350–7.450)
pH, Arterial: 7.393 (ref 7.350–7.450)
pH, Arterial: 7.399 (ref 7.350–7.450)
pH, Arterial: 7.41 (ref 7.350–7.450)
pH, Arterial: 7.334 — ABNORMAL LOW (ref 7.350–7.450)
pO2, Arterial: 263 mmHg — ABNORMAL HIGH (ref 83.0–108.0)
pO2, Arterial: 81 mmHg — ABNORMAL LOW (ref 83.0–108.0)
pO2, Arterial: 97 mmHg (ref 83.0–108.0)
pO2, Arterial: 61 mmHg — ABNORMAL LOW (ref 83.0–108.0)
pO2, Arterial: 91 mmHg (ref 83.0–108.0)
pO2, Arterial: 179 mmHg — ABNORMAL HIGH (ref 83.0–108.0)

## 2017-07-11 LAB — GLUCOSE, CAPILLARY
Glucose-Capillary: 92 mg/dL (ref 65–99)
Glucose-Capillary: 182 mg/dL — ABNORMAL HIGH (ref 65–99)
Glucose-Capillary: 162 mg/dL — ABNORMAL HIGH (ref 65–99)
Glucose-Capillary: 170 mg/dL — ABNORMAL HIGH (ref 65–99)
Glucose-Capillary: 172 mg/dL — ABNORMAL HIGH (ref 65–99)
Glucose-Capillary: 177 mg/dL — ABNORMAL HIGH (ref 65–99)
Glucose-Capillary: 159 mg/dL — ABNORMAL HIGH (ref 65–99)
Glucose-Capillary: 134 mg/dL — ABNORMAL HIGH (ref 65–99)
Glucose-Capillary: 116 mg/dL — ABNORMAL HIGH (ref 65–99)
Glucose-Capillary: 153 mg/dL — ABNORMAL HIGH (ref 65–99)
Glucose-Capillary: 145 mg/dL — ABNORMAL HIGH (ref 65–99)
Glucose-Capillary: 138 mg/dL — ABNORMAL HIGH (ref 65–99)
Glucose-Capillary: 126 mg/dL — ABNORMAL HIGH (ref 65–99)
Glucose-Capillary: 122 mg/dL — ABNORMAL HIGH (ref 65–99)
Glucose-Capillary: 128 mg/dL — ABNORMAL HIGH (ref 65–99)
Glucose-Capillary: 139 mg/dL — ABNORMAL HIGH (ref 65–99)
Glucose-Capillary: 169 mg/dL — ABNORMAL HIGH (ref 65–99)
Glucose-Capillary: 165 mg/dL — ABNORMAL HIGH (ref 65–99)
Glucose-Capillary: 163 mg/dL — ABNORMAL HIGH (ref 65–99)
Glucose-Capillary: 158 mg/dL — ABNORMAL HIGH (ref 65–99)

## 2017-07-11 LAB — BPAM FFP
Blood Product Expiration Date: 201901192359
Blood Product Expiration Date: 201901192359
Blood Product Expiration Date: 201901192359
Blood Product Expiration Date: 201901192359
ISSUE DATE / TIME: 201901141157
ISSUE DATE / TIME: 201901141157
ISSUE DATE / TIME: 201901151046
ISSUE DATE / TIME: 201901151046
UNIT TYPE AND RH: 6200
Unit Type and Rh: 6200
Unit Type and Rh: 6200
Unit Type and Rh: 6200

## 2017-07-11 LAB — COOXEMETRY PANEL
Carboxyhemoglobin: 1 % (ref 0.5–1.5)
Carboxyhemoglobin: 1 % (ref 0.5–1.5)
Carboxyhemoglobin: 1.1 % (ref 0.5–1.5)
Carboxyhemoglobin: 1.3 % (ref 0.5–1.5)
Carboxyhemoglobin: 1.4 % (ref 0.5–1.5)
Carboxyhemoglobin: 1.8 % — ABNORMAL HIGH (ref 0.5–1.5)
Methemoglobin: 0.9 % (ref 0.0–1.5)
Methemoglobin: 0.9 % (ref 0.0–1.5)
Methemoglobin: 1 % (ref 0.0–1.5)
Methemoglobin: 1 % (ref 0.0–1.5)
Methemoglobin: 1 % (ref 0.0–1.5)
Methemoglobin: 1.3 % (ref 0.0–1.5)
O2 Saturation: 64 %
O2 Saturation: 65.8 %
O2 Saturation: 68.5 %
O2 Saturation: 69.7 %
O2 Saturation: 70.8 %
O2 Saturation: 74.1 %
Total hemoglobin: 10.5 g/dL — ABNORMAL LOW (ref 12.0–16.0)
Total hemoglobin: 10.5 g/dL — ABNORMAL LOW (ref 12.0–16.0)
Total hemoglobin: 10.8 g/dL — ABNORMAL LOW (ref 12.0–16.0)
Total hemoglobin: 8.1 g/dL — ABNORMAL LOW (ref 12.0–16.0)
Total hemoglobin: 8.4 g/dL — ABNORMAL LOW (ref 12.0–16.0)
Total hemoglobin: 9.4 g/dL — ABNORMAL LOW (ref 12.0–16.0)

## 2017-07-11 LAB — BASIC METABOLIC PANEL
Anion gap: 15 (ref 5–15)
BUN: 12 mg/dL (ref 6–20)
CHLORIDE: 109 mmol/L (ref 101–111)
CO2: 18 mmol/L — ABNORMAL LOW (ref 22–32)
Calcium: 7.6 mg/dL — ABNORMAL LOW (ref 8.9–10.3)
Creatinine, Ser: 1 mg/dL (ref 0.44–1.00)
GFR, EST NON AFRICAN AMERICAN: 54 mL/min — AB (ref >60)
Glucose, Bld: 179 mg/dL — ABNORMAL HIGH (ref 65–99)
POTASSIUM: 3.5 mmol/L (ref 3.5–5.1)
SODIUM: 142 mmol/L (ref 135–145)

## 2017-07-11 LAB — COMPREHENSIVE METABOLIC PANEL
ALT: 69 U/L — ABNORMAL HIGH (ref 14–54)
AST: 151 U/L — ABNORMAL HIGH (ref 15–41)
Albumin: 3.1 g/dL — ABNORMAL LOW (ref 3.5–5.0)
Alkaline Phosphatase: 46 U/L (ref 38–126)
Anion gap: 13 (ref 5–15)
BUN: 13 mg/dL (ref 6–20)
CO2: 23 mmol/L (ref 22–32)
Calcium: 8.3 mg/dL — ABNORMAL LOW (ref 8.9–10.3)
Chloride: 107 mmol/L (ref 101–111)
Creatinine, Ser: 0.96 mg/dL (ref 0.44–1.00)
GFR calc Af Amer: >60 mL/min (ref >60)
GFR calc non Af Amer: 56 mL/min — ABNORMAL LOW (ref >60)
Glucose, Bld: 126 mg/dL — ABNORMAL HIGH (ref 65–99)
Potassium: 2.8 mmol/L — ABNORMAL LOW (ref 3.5–5.1)
Sodium: 143 mmol/L (ref 135–145)
Total Bilirubin: 2 mg/dL — ABNORMAL HIGH (ref 0.3–1.2)
Total Protein: 4.2 g/dL — ABNORMAL LOW (ref 6.5–8.1)

## 2017-07-11 LAB — PREPARE CRYOPRECIPITATE
Unit division: 0
Unit division: 0
Unit division: 0

## 2017-07-11 LAB — MAGNESIUM
MAGNESIUM: 2.3 mg/dL (ref 1.7–2.4)
MAGNESIUM: 1.6 mg/dL — AB (ref 1.7–2.4)
Magnesium: 2.6 mg/dL — ABNORMAL HIGH (ref 1.7–2.4)

## 2017-07-11 LAB — BPAM CRYOPRECIPITATE
Blood Product Expiration Date: 201901141925
Blood Product Expiration Date: 201901142150
Blood Product Expiration Date: 201901142150
ISSUE DATE / TIME: 201901141345
ISSUE DATE / TIME: 201901141610
ISSUE DATE / TIME: 201901141610
Unit Type and Rh: 6200
Unit Type and Rh: 6200
Unit Type and Rh: 6200

## 2017-07-11 LAB — HEPATIC FUNCTION PANEL
ALT: 70 U/L — ABNORMAL HIGH (ref 14–54)
AST: 150 U/L — ABNORMAL HIGH (ref 15–41)
Albumin: 3.1 g/dL — ABNORMAL LOW (ref 3.5–5.0)
Alkaline Phosphatase: 46 U/L (ref 38–126)
Bilirubin, Direct: 0.3 mg/dL (ref 0.1–0.5)
Indirect Bilirubin: 1.7 mg/dL — ABNORMAL HIGH (ref 0.3–0.9)
Total Bilirubin: 2 mg/dL — ABNORMAL HIGH (ref 0.3–1.2)
Total Protein: 4.2 g/dL — ABNORMAL LOW (ref 6.5–8.1)

## 2017-07-11 LAB — PREPARE PLATELET PHERESIS
Unit division: 0
Unit division: 0

## 2017-07-11 LAB — POCT ACTIVATED CLOTTING TIME: Activated Clotting Time: 142 seconds

## 2017-07-11 LAB — APTT
aPTT: >200 seconds (ref 24–36)
aPTT: 75 seconds — ABNORMAL HIGH (ref 24–36)
aPTT: 41 seconds — ABNORMAL HIGH (ref 24–36)

## 2017-07-11 LAB — PROTIME-INR
INR: 1.73
INR: 1.85
INR: 1.71
Prothrombin Time: 20.1 seconds — ABNORMAL HIGH (ref 11.4–15.2)
Prothrombin Time: 21.2 seconds — ABNORMAL HIGH (ref 11.4–15.2)
Prothrombin Time: 19.9 seconds — ABNORMAL HIGH (ref 11.4–15.2)

## 2017-07-11 LAB — PREPARE RBC (CROSSMATCH)

## 2017-07-11 SURGERY — ECHOCARDIOGRAM, TRANSESOPHAGEAL
Anesthesia: General | Site: Chest | Laterality: Right

## 2017-07-11 MED ORDER — METHYLENE BLUE 0.5 % INJ SOLN
2.0000 mg/kg | Freq: Once | INTRAVENOUS | Status: DC
  Filled 2017-07-11: qty 40

## 2017-07-11 MED ORDER — POTASSIUM CHLORIDE 10 MEQ/50ML IV SOLN: 10.0000 meq | INTRAVENOUS | Status: DC

## 2017-07-11 MED ORDER — DEXMEDETOMIDINE HCL IN NACL 400 MCG/100ML IV SOLN: 0.0000 ug/kg/h | INTRAVENOUS | Status: DC

## 2017-07-11 MED ORDER — HEPARIN (PORCINE) IN NACL 100-0.45 UNIT/ML-% IJ SOLN: 500.0000 [IU]/h | INTRAMUSCULAR | Status: DC

## 2017-07-11 MED ORDER — LACTATED RINGERS IV SOLN: 500.0000 mL | Freq: Once | INTRAVENOUS | Status: DC | PRN

## 2017-07-11 MED ORDER — NOREPINEPHRINE BITARTRATE 1 MG/ML IV SOLN
2.0000 ug/min | INTRAVENOUS | Status: DC
  Filled 2017-07-11 (×3): qty 16

## 2017-07-11 MED ORDER — FUROSEMIDE 10 MG/ML IJ SOLN
10.0000 mg | Freq: Once | INTRAMUSCULAR | Status: AC
  Administered 2017-07-11: 10 mg via INTRAVENOUS
  Filled 2017-07-11: qty 2

## 2017-07-11 MED ORDER — CHLORHEXIDINE GLUCONATE 0.12 % MT SOLN
15.0000 mL | OROMUCOSAL | Status: AC
  Administered 2017-07-11: 15 mL via OROMUCOSAL

## 2017-07-11 MED ORDER — NOREPINEPHRINE BITARTRATE 1 MG/ML IV SOLN
2.0000 ug/min | INTRAVENOUS | Status: DC
  Filled 2017-07-11 (×2): qty 16

## 2017-07-11 MED ORDER — SODIUM CHLORIDE 0.9 % IJ SOLN
INTRAMUSCULAR | Status: DC | PRN
  Administered 2017-07-11: 30 mL via INTRAVENOUS

## 2017-07-11 MED ORDER — SODIUM CHLORIDE 0.45 % IV SOLN: INTRAVENOUS | Status: DC | PRN

## 2017-07-11 MED ORDER — BISACODYL 5 MG PO TBEC: 10.0000 mg | DELAYED_RELEASE_TABLET | Freq: Every day | ORAL | Status: DC

## 2017-07-11 MED ORDER — DEXTROSE 5 % IV SOLN: 1.5000 g | Freq: Two times a day (BID) | INTRAVENOUS | Status: DC

## 2017-07-11 MED ORDER — ALBUMIN HUMAN 5 % IV SOLN
250.0000 mL | INTRAVENOUS | Status: AC | PRN
  Administered 2017-07-11: 250 mL via INTRAVENOUS
  Filled 2017-07-11: qty 250

## 2017-07-11 MED ORDER — SODIUM CHLORIDE 0.9% FLUSH
3.0000 mL | Freq: Two times a day (BID) | INTRAVENOUS | Status: DC
  Administered 2017-07-12: 3 mL via INTRAVENOUS

## 2017-07-11 MED ORDER — NOREPINEPHRINE BITARTRATE 1 MG/ML IV SOLN
0.0000 ug/min | INTRAVENOUS | Status: DC
  Administered 2017-07-12: 20 ug/min via INTRAVENOUS
  Administered 2017-07-13: 12 ug/min via INTRAVENOUS
  Administered 2017-07-14: 9.5 ug/min via INTRAVENOUS
  Administered 2017-07-16: 14 ug/min via INTRAVENOUS
  Administered 2017-07-17: 8 ug/min via INTRAVENOUS
  Filled 2017-07-11 (×6): qty 16

## 2017-07-11 MED ORDER — FUROSEMIDE 10 MG/ML IJ SOLN
INTRAMUSCULAR | Status: DC | PRN
  Administered 2017-07-11: 20 mg via INTRAMUSCULAR

## 2017-07-11 MED ORDER — SODIUM CHLORIDE 0.9% FLUSH: 3.0000 mL | INTRAVENOUS | Status: DC | PRN

## 2017-07-11 MED ORDER — MILRINONE LACTATE IN DEXTROSE 20-5 MG/100ML-% IV SOLN
0.2500 ug/kg/min | INTRAVENOUS | Status: DC
  Administered 2017-07-11 – 2017-07-13 (×3): 0.25 ug/kg/min via INTRAVENOUS
  Filled 2017-07-11 (×3): qty 100

## 2017-07-11 MED ORDER — CEFAZOLIN SODIUM-DEXTROSE 2-3 GM-%(50ML) IV SOLR
INTRAVENOUS | Status: DC | PRN
  Administered 2017-07-11: 2 g via INTRAVENOUS

## 2017-07-11 MED ORDER — CALCIUM CHLORIDE 10 % IV SOLN
INTRAVENOUS | Status: AC
  Filled 2017-07-11: qty 10

## 2017-07-11 MED ORDER — GELATIN ABSORBABLE MT POWD
OROMUCOSAL | Status: DC | PRN
  Administered 2017-07-11: 4 mL via TOPICAL

## 2017-07-11 MED ORDER — SODIUM BICARBONATE 8.4 % IV SOLN
INTRAVENOUS | Status: DC | PRN
  Administered 2017-07-11 (×2): 50 meq via INTRAVENOUS

## 2017-07-11 MED ORDER — SODIUM CHLORIDE 0.9 % IV SOLN: Freq: Once | INTRAVENOUS | Status: DC

## 2017-07-11 MED ORDER — ORAL CARE MOUTH RINSE
15.0000 mL | OROMUCOSAL | Status: DC
  Administered 2017-07-11 – 2017-07-17 (×54): 15 mL via OROMUCOSAL

## 2017-07-11 MED ORDER — LACTATED RINGERS IV SOLN
INTRAVENOUS | Status: DC
  Administered 2017-07-17: 07:00:00 via INTRAVENOUS

## 2017-07-11 MED ORDER — SODIUM CHLORIDE 0.9 % IV SOLN
250.0000 mL | INTRAVENOUS | Status: DC
  Administered 2017-07-12: 250 mL via INTRAVENOUS

## 2017-07-11 MED ORDER — POTASSIUM CHLORIDE 10 MEQ/50ML IV SOLN
10.0000 meq | INTRAVENOUS | Status: AC
  Administered 2017-07-11 (×5): 10 meq via INTRAVENOUS
  Filled 2017-07-11 (×4): qty 50

## 2017-07-11 MED ORDER — HEPARIN (PORCINE) IN NACL 100-0.45 UNIT/ML-% IJ SOLN
1200.0000 [IU]/h | INTRAMUSCULAR | Status: DC
  Administered 2017-07-11: 500 [IU]/h via INTRAVENOUS
  Administered 2017-07-12: 800 [IU]/h via INTRAVENOUS
  Administered 2017-07-14: 900 [IU]/h via INTRAVENOUS
  Administered 2017-07-15 – 2017-07-16 (×2): 1000 [IU]/h via INTRAVENOUS
  Administered 2017-07-17: 1200 [IU]/h via INTRAVENOUS
  Filled 2017-07-11 (×5): qty 250

## 2017-07-11 MED ORDER — MIDAZOLAM HCL 2 MG/2ML IJ SOLN
INTRAMUSCULAR | Status: AC
  Filled 2017-07-11: qty 2

## 2017-07-11 MED ORDER — ACETAMINOPHEN 160 MG/5ML PO SOLN: 650.0000 mg | Freq: Once | ORAL | Status: DC

## 2017-07-11 MED ORDER — DOCUSATE SODIUM 100 MG PO CAPS: 200.0000 mg | ORAL_CAPSULE | Freq: Every day | ORAL | Status: DC

## 2017-07-11 MED ORDER — SODIUM CHLORIDE 0.9 % IV SOLN: INTRAVENOUS | Status: DC

## 2017-07-11 MED ORDER — SODIUM CHLORIDE 0.9 % IV SOLN
25.0000 ug/h | INTRAVENOUS | Status: DC
  Filled 2017-07-11: qty 50

## 2017-07-11 MED ORDER — AMIODARONE LOAD VIA INFUSION
150.0000 mg | Freq: Once | INTRAVENOUS | Status: AC
  Administered 2017-07-11: 150 mg via INTRAVENOUS

## 2017-07-11 MED ORDER — PANTOPRAZOLE SODIUM 40 MG IV SOLR
40.0000 mg | INTRAVENOUS | Status: DC
  Administered 2017-07-11 – 2017-07-16 (×6): 40 mg via INTRAVENOUS
  Filled 2017-07-11 (×7): qty 40

## 2017-07-11 MED ORDER — SODIUM CHLORIDE 0.9 % IV SOLN
0.0000 mg/h | INTRAVENOUS | Status: DC
  Administered 2017-07-11 – 2017-07-16 (×12): 2 mg/h via INTRAVENOUS
  Filled 2017-07-11 (×8): qty 10

## 2017-07-11 MED ORDER — SODIUM BICARBONATE 8.4 % IV SOLN
INTRAVENOUS | Status: AC
  Administered 2017-07-11: 25 meq
  Filled 2017-07-11: qty 50

## 2017-07-11 MED ORDER — HEPARIN BOLUS VIA INFUSION
500.0000 [IU] | INTRAVENOUS | Status: DC | PRN
  Filled 2017-07-11: qty 1000

## 2017-07-11 MED ORDER — SODIUM BICARBONATE 8.4 % IV SOLN
50.0000 meq | Freq: Once | INTRAVENOUS | Status: AC
  Administered 2017-07-11: 50 meq via INTRAVENOUS

## 2017-07-11 MED ORDER — PHENYLEPHRINE 40 MCG/ML (10ML) SYRINGE FOR IV PUSH (FOR BLOOD PRESSURE SUPPORT)
PREFILLED_SYRINGE | INTRAVENOUS | Status: DC | PRN
  Administered 2017-07-11: 80 ug via INTRAVENOUS
  Administered 2017-07-11: 120 ug via INTRAVENOUS
  Administered 2017-07-11 (×6): 80 ug via INTRAVENOUS

## 2017-07-11 MED ORDER — MUPIROCIN 2 % EX OINT: 1.0000 "application " | TOPICAL_OINTMENT | Freq: Two times a day (BID) | CUTANEOUS | Status: DC

## 2017-07-11 MED ORDER — NITROGLYCERIN IN D5W 200-5 MCG/ML-% IV SOLN
0.0000 ug/min | INTRAVENOUS | Status: DC
  Filled 2017-07-11: qty 250

## 2017-07-11 MED ORDER — HEPARIN BOLUS VIA INFUSION
1000.0000 [IU] | Freq: Once | INTRAVENOUS | Status: AC
  Administered 2017-07-11: 1000 [IU] via INTRAVENOUS

## 2017-07-11 MED ORDER — VANCOMYCIN HCL IN DEXTROSE 1-5 GM/200ML-% IV SOLN: 1000.0000 mg | Freq: Once | INTRAVENOUS | Status: DC

## 2017-07-11 MED ORDER — FENTANYL BOLUS VIA INFUSION
25.0000 ug | INTRAVENOUS | Status: DC | PRN
  Administered 2017-07-12 – 2017-07-17 (×18): 25 ug via INTRAVENOUS
  Filled 2017-07-11: qty 25

## 2017-07-11 MED ORDER — BISACODYL 10 MG RE SUPP: 10.0000 mg | Freq: Every day | RECTAL | Status: DC

## 2017-07-11 MED ORDER — SODIUM BICARBONATE 8.4 % IV SOLN
25.0000 meq | Freq: Once | INTRAVENOUS | Status: AC
  Administered 2017-07-11: 25 meq via INTRAVENOUS

## 2017-07-11 MED ORDER — MIDAZOLAM HCL 5 MG/5ML IJ SOLN
INTRAMUSCULAR | Status: DC | PRN
  Administered 2017-07-11: 2 mg via INTRAVENOUS

## 2017-07-11 MED ORDER — SODIUM CHLORIDE 0.9 % IV SOLN
INTRAVENOUS | Status: DC | PRN
  Administered 2017-07-11: 08:00:00 via INTRAVENOUS

## 2017-07-11 MED ORDER — LEVOTHYROXINE SODIUM 112 MCG PO TABS: 112.0000 ug | ORAL_TABLET | ORAL | Status: DC

## 2017-07-11 MED ORDER — SODIUM CHLORIDE 0.9 % IV SOLN
Freq: Once | INTRAVENOUS | Status: AC
  Administered 2017-07-11: 21:00:00 via INTRAVENOUS

## 2017-07-11 MED ORDER — FENTANYL CITRATE (PF) 100 MCG/2ML IJ SOLN
50.0000 ug | Freq: Once | INTRAMUSCULAR | Status: DC
  Filled 2017-07-11: qty 2

## 2017-07-11 MED ORDER — VASOPRESSIN 20 UNIT/ML IV SOLN
0.0400 [IU]/min | INTRAVENOUS | Status: DC | PRN
  Filled 2017-07-11: qty 2

## 2017-07-11 MED ORDER — DEXTROSE 5 % IV SOLN: 0.0000 ug/min | INTRAVENOUS | Status: DC

## 2017-07-11 MED ORDER — TRAMADOL HCL 50 MG PO TABS: 50.0000 mg | ORAL_TABLET | ORAL | Status: DC | PRN

## 2017-07-11 MED ORDER — FAMOTIDINE IN NACL 20-0.9 MG/50ML-% IV SOLN: 20.0000 mg | Freq: Two times a day (BID) | INTRAVENOUS | Status: DC

## 2017-07-11 MED ORDER — POTASSIUM CHLORIDE 10 MEQ/50ML IV SOLN
10.0000 meq | INTRAVENOUS | Status: AC
  Administered 2017-07-11 (×3): 10 meq via INTRAVENOUS
  Filled 2017-07-11 (×3): qty 50

## 2017-07-11 MED ORDER — ALBUMIN HUMAN 5 % IV SOLN
12.5000 g | Freq: Once | INTRAVENOUS | Status: AC
  Administered 2017-07-11: 12.5 g via INTRAVENOUS

## 2017-07-11 MED ORDER — ACETAMINOPHEN 160 MG/5ML PO SOLN: 1000.0000 mg | Freq: Four times a day (QID) | ORAL | Status: DC

## 2017-07-11 MED ORDER — POTASSIUM CHLORIDE 10 MEQ/50ML IV SOLN
INTRAVENOUS | Status: AC
  Filled 2017-07-11: qty 50

## 2017-07-11 MED ORDER — BISACODYL 10 MG RE SUPP: 10.0000 mg | Freq: Every day | RECTAL | Status: DC | PRN

## 2017-07-11 MED ORDER — DEXTROSE 5 % IV SOLN
1.5000 g | Freq: Two times a day (BID) | INTRAVENOUS | Status: DC
  Administered 2017-07-11 – 2017-07-13 (×4): 1.5 g via INTRAVENOUS
  Filled 2017-07-11 (×5): qty 1.5

## 2017-07-11 MED ORDER — ONDANSETRON HCL 4 MG/2ML IJ SOLN: 4.0000 mg | Freq: Four times a day (QID) | INTRAMUSCULAR | Status: DC | PRN

## 2017-07-11 MED ORDER — HEPARIN (PORCINE) IN NACL 100-0.45 UNIT/ML-% IJ SOLN
500.0000 [IU]/h | INTRAMUSCULAR | Status: DC
  Filled 2017-07-11: qty 250

## 2017-07-11 MED ORDER — FENTANYL 2500MCG IN NS 250ML (10MCG/ML) PREMIX INFUSION
25.0000 ug/h | INTRAVENOUS | Status: DC
  Administered 2017-07-11: 25 ug/h via INTRAVENOUS
  Administered 2017-07-12: 50 ug/h via INTRAVENOUS
  Administered 2017-07-13 – 2017-07-14 (×2): 100 ug/h via INTRAVENOUS
  Administered 2017-07-15: 25 ug/h via INTRAVENOUS
  Administered 2017-07-15: 325 ug/h via INTRAVENOUS
  Administered 2017-07-15 (×2): 300 ug/h via INTRAVENOUS
  Administered 2017-07-16: 350 ug/h via INTRAVENOUS
  Administered 2017-07-16: 250 ug/h via INTRAVENOUS
  Administered 2017-07-16: 350 ug/h via INTRAVENOUS
  Administered 2017-07-17: 250 ug/h via INTRAVENOUS
  Filled 2017-07-11 (×12): qty 250

## 2017-07-11 MED ORDER — HEMOSTATIC AGENTS (NO CHARGE) OPTIME
TOPICAL | Status: DC | PRN
  Administered 2017-07-11: 1 via TOPICAL

## 2017-07-11 MED ORDER — MUPIROCIN 2 % EX OINT
1.0000 "application " | TOPICAL_OINTMENT | Freq: Two times a day (BID) | CUTANEOUS | Status: DC
  Administered 2017-07-11 – 2017-07-16 (×11): 1 via TOPICAL
  Filled 2017-07-11: qty 22

## 2017-07-11 MED ORDER — MORPHINE SULFATE (PF) 4 MG/ML IV SOLN: 2.0000 mg | INTRAVENOUS | Status: DC | PRN

## 2017-07-11 MED ORDER — ACETAMINOPHEN 650 MG RE SUPP: 650.0000 mg | Freq: Once | RECTAL | Status: DC

## 2017-07-11 MED ORDER — ACETAMINOPHEN 500 MG PO TABS: 1000.0000 mg | ORAL_TABLET | Freq: Four times a day (QID) | ORAL | Status: DC

## 2017-07-11 MED ORDER — MIDAZOLAM HCL 2 MG/2ML IJ SOLN
2.0000 mg | INTRAMUSCULAR | Status: DC | PRN
  Administered 2017-07-11: 2 mg via INTRAVENOUS
  Administered 2017-07-11 (×3): 1 mg via INTRAVENOUS
  Administered 2017-07-11 – 2017-07-15 (×13): 2 mg via INTRAVENOUS
  Administered 2017-07-16 (×2): 1 mg via INTRAVENOUS
  Administered 2017-07-16: 2 mg via INTRAVENOUS
  Administered 2017-07-16 – 2017-07-17 (×4): 1 mg via INTRAVENOUS
  Filled 2017-07-11 (×2): qty 2

## 2017-07-11 MED ORDER — MAGNESIUM SULFATE 4 GM/100ML IV SOLN
4.0000 g | Freq: Once | INTRAVENOUS | Status: AC
  Administered 2017-07-11: 4 g via INTRAVENOUS
  Filled 2017-07-11: qty 100

## 2017-07-11 MED ORDER — CHLORHEXIDINE GLUCONATE 0.12% ORAL RINSE (MEDLINE KIT)
15.0000 mL | Freq: Two times a day (BID) | OROMUCOSAL | Status: DC
  Administered 2017-07-11 – 2017-07-16 (×11): 15 mL via OROMUCOSAL

## 2017-07-11 MED ORDER — CYANOCOBALAMIN 1000 MCG/ML IJ SOLN: 1000.0000 ug | INTRAMUSCULAR | Status: DC

## 2017-07-11 MED ORDER — ALBUMIN HUMAN 5 % IV SOLN
INTRAVENOUS | Status: DC | PRN
Start: 2017-07-11 — End: 2017-07-11
  Administered 2017-07-11 (×2): via INTRAVENOUS

## 2017-07-11 MED ORDER — DOCUSATE SODIUM 50 MG/5ML PO LIQD: 100.0000 mg | Freq: Two times a day (BID) | ORAL | Status: DC | PRN

## 2017-07-11 MED ORDER — MORPHINE SULFATE (PF) 4 MG/ML IV SOLN: 1.0000 mg | INTRAVENOUS | Status: AC | PRN

## 2017-07-11 MED ORDER — CALCIUM CHLORIDE 10 % IV SOLN
INTRAVENOUS | Status: DC | PRN
  Administered 2017-07-11: 1 g via INTRAVENOUS

## 2017-07-11 MED ORDER — OXYCODONE HCL 5 MG PO TABS: 5.0000 mg | ORAL_TABLET | ORAL | Status: DC | PRN

## 2017-07-11 MED ORDER — SODIUM CHLORIDE 0.9 % IV SOLN
1250.0000 mg | INTRAVENOUS | Status: DC
  Administered 2017-07-11 – 2017-07-16 (×6): 1250 mg via INTRAVENOUS
  Filled 2017-07-11 (×7): qty 1250

## 2017-07-11 MED ORDER — PHENYLEPHRINE HCL 10 MG/ML IJ SOLN
INTRAVENOUS | Status: DC | PRN
  Administered 2017-07-11: 25 ug/min via INTRAVENOUS

## 2017-07-11 MED ORDER — HEPARIN SODIUM (PORCINE) 5000 UNIT/ML IJ SOLN
INTRAMUSCULAR | Status: DC | PRN
  Administered 2017-07-11: 500 mL

## 2017-07-11 MED ORDER — MIDAZOLAM HCL 2 MG/2ML IJ SOLN: 2.0000 mg | INTRAMUSCULAR | Status: DC | PRN

## 2017-07-11 MED ORDER — INSULIN REGULAR BOLUS VIA INFUSION
0.0000 [IU] | Freq: Three times a day (TID) | INTRAVENOUS | Status: DC
  Filled 2017-07-11: qty 10

## 2017-07-11 MED ORDER — METOPROLOL TARTRATE 5 MG/5ML IV SOLN: 2.5000 mg | INTRAVENOUS | Status: DC | PRN

## 2017-07-11 MED ORDER — AMIODARONE HCL IN DEXTROSE 360-4.14 MG/200ML-% IV SOLN
30.0000 mg/h | INTRAVENOUS | Status: DC
  Administered 2017-07-11 – 2017-07-17 (×11): 30 mg/h via INTRAVENOUS
  Filled 2017-07-11 (×12): qty 200

## 2017-07-11 MED ORDER — EPINEPHRINE PF 1 MG/ML IJ SOLN
0.0000 ug/min | INTRAVENOUS | Status: DC
  Administered 2017-07-12 – 2017-07-16 (×13): 8 ug/min via INTRAVENOUS
  Filled 2017-07-11 (×13): qty 4

## 2017-07-11 MED ORDER — AMIODARONE HCL IN DEXTROSE 360-4.14 MG/200ML-% IV SOLN
60.0000 mg/h | INTRAVENOUS | Status: AC
  Administered 2017-07-11 (×3): 60 mg/h via INTRAVENOUS

## 2017-07-11 MED ORDER — HEPARIN SODIUM (PORCINE) 1000 UNIT/ML IJ SOLN
INTRAMUSCULAR | Status: DC | PRN
  Administered 2017-07-11: 10000 [IU] via INTRAVENOUS

## 2017-07-11 MED ORDER — ALBUMIN HUMAN 5 % IV SOLN
INTRAVENOUS | Status: AC
  Administered 2017-07-11: 12.5 g
  Filled 2017-07-11: qty 250

## 2017-07-11 MED ORDER — 0.9 % SODIUM CHLORIDE (POUR BTL) OPTIME
TOPICAL | Status: DC | PRN
  Administered 2017-07-11: 5000 mL

## 2017-07-11 MED ORDER — DEXMEDETOMIDINE HCL IN NACL 400 MCG/100ML IV SOLN
0.4000 ug/kg/h | INTRAVENOUS | Status: DC
  Administered 2017-07-11 – 2017-07-12 (×4): 1.2 ug/kg/h via INTRAVENOUS
  Administered 2017-07-12 (×3): 1 ug/kg/h via INTRAVENOUS
  Administered 2017-07-13 (×3): 1.2 ug/kg/h via INTRAVENOUS
  Filled 2017-07-11 (×9): qty 100

## 2017-07-11 MED ORDER — CALCIUM CHLORIDE 10 % IV SOLN
1.0000 g | Freq: Once | INTRAVENOUS | Status: AC
  Administered 2017-07-11: 1 g via INTRAVENOUS

## 2017-07-11 MED ORDER — ROCURONIUM BROMIDE 100 MG/10ML IV SOLN
INTRAVENOUS | Status: DC | PRN
  Administered 2017-07-11: 20 mg via INTRAVENOUS
  Administered 2017-07-11: 30 mg via INTRAVENOUS
  Administered 2017-07-11: 50 mg via INTRAVENOUS

## 2017-07-11 MED ORDER — VENLAFAXINE HCL ER 75 MG PO CP24
75.0000 mg | ORAL_CAPSULE | Freq: Every day | ORAL | Status: DC
  Administered 2017-07-13 – 2017-07-16 (×4): 75 mg via ORAL
  Filled 2017-07-11 (×6): qty 1

## 2017-07-11 MED ORDER — ASPIRIN 81 MG PO TABS: 81.0000 mg | ORAL_TABLET | ORAL | Status: DC

## 2017-07-11 MED ORDER — AMIODARONE LOAD VIA INFUSION
150.0000 mg | Freq: Once | INTRAVENOUS | Status: AC
  Administered 2017-07-11: 150 mg via INTRAVENOUS
  Filled 2017-07-11: qty 83.34

## 2017-07-11 MED ORDER — SODIUM CHLORIDE 0.9 % IV SOLN
INTRAVENOUS | Status: DC
  Administered 2017-07-13: 2.9 [IU]/h via INTRAVENOUS

## 2017-07-11 MED ORDER — LACTATED RINGERS IV SOLN
INTRAVENOUS | Status: DC
  Administered 2017-07-14: via INTRAVENOUS

## 2017-07-11 MED FILL — Magnesium Sulfate Inj 50%: INTRAMUSCULAR | Qty: 10 | Status: AC

## 2017-07-11 MED FILL — Potassium Chloride Inj 2 mEq/ML: INTRAVENOUS | Qty: 40 | Status: AC

## 2017-07-11 MED FILL — Heparin Sodium (Porcine) Inj 1000 Unit/ML: INTRAMUSCULAR | Qty: 30 | Status: AC

## 2017-07-11 SURGICAL SUPPLY — 116 items
ADAPTER CARDIO PERF ANTE/RETRO (ADAPTER) ×4 IMPLANT
ADPR PRFSN 84XANTGRD RTRGD (ADAPTER)
APL SRG 7X2 LUM MLBL SLNT (VASCULAR PRODUCTS) ×4
APPLICATOR TIP COSEAL (VASCULAR PRODUCTS) ×2 IMPLANT
BAG DECANTER FOR FLEXI CONT (MISCELLANEOUS) ×6 IMPLANT
BANDAGE ACE 4X5 VEL STRL LF (GAUZE/BANDAGES/DRESSINGS) ×4 IMPLANT
BANDAGE ACE 6X5 VEL STRL LF (GAUZE/BANDAGES/DRESSINGS) ×4 IMPLANT
BASKET HEART  (ORDER IN 25'S) (MISCELLANEOUS)
BASKET HEART (ORDER IN 25'S) (MISCELLANEOUS)
BASKET HEART (ORDER IN 25S) (MISCELLANEOUS) ×4 IMPLANT
BLADE CLIPPER SURG (BLADE) IMPLANT
BLADE STERNUM SYSTEM 6 (BLADE) ×4 IMPLANT
BLADE SURG 12 STRL SS (BLADE) ×6 IMPLANT
BNDG GAUZE ELAST 4 BULKY (GAUZE/BANDAGES/DRESSINGS) ×4 IMPLANT
CANISTER SUCT 3000ML PPV (MISCELLANEOUS) ×6 IMPLANT
CANNULA EZ GLIDE AORTIC 21FR (CANNULA) ×2 IMPLANT
CANNULA GUNDRY RCSP 15FR (MISCELLANEOUS) ×4 IMPLANT
CATH CPB KIT VANTRIGT (MISCELLANEOUS) ×4 IMPLANT
CATH ROBINSON RED A/P 18FR (CATHETERS) ×14 IMPLANT
CATH THORACIC 36FR RT ANG (CATHETERS) ×6 IMPLANT
CATH THORACIC 40FR (CATHETERS) ×2 IMPLANT
CLIP TI LARGE 6 (CLIP) ×2 IMPLANT
CONN 3/8X1/2 ST GISH (MISCELLANEOUS) ×2 IMPLANT
CRADLE DONUT ADULT HEAD (MISCELLANEOUS) ×4 IMPLANT
DRAIN CHANNEL 32F RND 10.7 FF (WOUND CARE) ×6 IMPLANT
DRAPE CARDIOVASCULAR INCISE (DRAPES) ×6
DRAPE SLUSH/WARMER DISC (DRAPES) ×6 IMPLANT
DRAPE SRG 135X102X78XABS (DRAPES) ×4 IMPLANT
DRSG AQUACEL AG ADV 3.5X14 (GAUZE/BANDAGES/DRESSINGS) ×6 IMPLANT
ELECT BLADE 4.0 EZ CLEAN MEGAD (MISCELLANEOUS) ×6
ELECT BLADE 6.5 EXT (BLADE) ×6 IMPLANT
ELECT CAUTERY BLADE 6.4 (BLADE) ×6 IMPLANT
ELECT REM PT RETURN 9FT ADLT (ELECTROSURGICAL) ×12
ELECTRODE BLDE 4.0 EZ CLN MEGD (MISCELLANEOUS) ×4 IMPLANT
ELECTRODE REM PT RTRN 9FT ADLT (ELECTROSURGICAL) ×8 IMPLANT
FELT TEFLON 1X6 (MISCELLANEOUS) ×12 IMPLANT
FEMORAL VENOUS CANN RAP (CANNULA) ×2 IMPLANT
GAUZE SPONGE 4X4 12PLY STRL (GAUZE/BANDAGES/DRESSINGS) ×12 IMPLANT
GAUZE SPONGE 4X4 12PLY STRL LF (GAUZE/BANDAGES/DRESSINGS) ×2 IMPLANT
GAUZE XEROFORM 1X8 LF (GAUZE/BANDAGES/DRESSINGS) ×2 IMPLANT
GLOVE BIO SURGEON STRL SZ7.5 (GLOVE) ×16 IMPLANT
GLOVE BIOGEL PI IND STRL 6 (GLOVE) IMPLANT
GLOVE BIOGEL PI IND STRL 6.5 (GLOVE) IMPLANT
GLOVE BIOGEL PI INDICATOR 6 (GLOVE) ×6
GLOVE BIOGEL PI INDICATOR 6.5 (GLOVE) ×6
GLOVE SURG SS PI 7.5 STRL IVOR (GLOVE) ×4 IMPLANT
GOWN STRL REUS W/ TWL LRG LVL3 (GOWN DISPOSABLE) ×16 IMPLANT
GOWN STRL REUS W/TWL LRG LVL3 (GOWN DISPOSABLE) ×36
HEMOSTAT POWDER SURGIFOAM 1G (HEMOSTASIS) ×20 IMPLANT
HEMOSTAT SURGICEL 2X14 (HEMOSTASIS) ×4 IMPLANT
INSERT FOGARTY XLG (MISCELLANEOUS) IMPLANT
KIT BASIN OR (CUSTOM PROCEDURE TRAY) ×6 IMPLANT
KIT DILATOR VASC 18G NDL (KITS) ×4 IMPLANT
KIT ROOM TURNOVER OR (KITS) ×6 IMPLANT
KIT SUCTION CATH 14FR (SUCTIONS) ×6 IMPLANT
KIT TOURNIQUET VASCULAR (KITS) ×2 IMPLANT
KIT VASOVIEW HEMOPRO VH 3000 (KITS) ×4 IMPLANT
LEAD PACING MYOCARDI (MISCELLANEOUS) ×4 IMPLANT
MARKER GRAFT CORONARY BYPASS (MISCELLANEOUS) ×12 IMPLANT
NDL PERC 18GX7CM (NEEDLE) IMPLANT
NEEDLE PERC 18GX7CM (NEEDLE) ×6 IMPLANT
NS IRRIG 1000ML POUR BTL (IV SOLUTION) ×30 IMPLANT
PACK E OPEN HEART (SUTURE) ×6 IMPLANT
PACK OPEN HEART (CUSTOM PROCEDURE TRAY) ×6 IMPLANT
PACK VENTRIC ASSIST CUSTOM (KITS) ×2 IMPLANT
PAD ARMBOARD 7.5X6 YLW CONV (MISCELLANEOUS) ×12 IMPLANT
PAD ELECT DEFIB RADIOL ZOLL (MISCELLANEOUS) ×6 IMPLANT
PENCIL BUTTON HOLSTER BLD 10FT (ELECTRODE) ×6 IMPLANT
PUMP BLOOD CENTRIMAG (PUMP) ×2 IMPLANT
PUNCH AORTIC ROTATE 4.0MM (MISCELLANEOUS) IMPLANT
PUNCH AORTIC ROTATE 4.5MM 8IN (MISCELLANEOUS) IMPLANT
PUNCH AORTIC ROTATE 5MM 8IN (MISCELLANEOUS) IMPLANT
SEALANT SURG COSEAL 8ML (VASCULAR PRODUCTS) ×2 IMPLANT
SHEATH AVANTI 11CM 5FR (MISCELLANEOUS) ×2 IMPLANT
STAPLER VISISTAT 35W (STAPLE) ×2 IMPLANT
SURGIFLO W/THROMBIN 8M KIT (HEMOSTASIS) ×6 IMPLANT
SUT BONE WAX W31G (SUTURE) ×6 IMPLANT
SUT ETHIBOND NAB MH 2-0 36IN (SUTURE) ×8 IMPLANT
SUT ETHILON 3 0 FSL (SUTURE) ×2 IMPLANT
SUT MNCRL AB 4-0 PS2 18 (SUTURE) IMPLANT
SUT PROLENE 3 0 SH DA (SUTURE) ×4 IMPLANT
SUT PROLENE 3 0 SH1 36 (SUTURE) ×4 IMPLANT
SUT PROLENE 4 0 RB 1 (SUTURE) ×6
SUT PROLENE 4 0 SH DA (SUTURE) ×10 IMPLANT
SUT PROLENE 4-0 RB1 .5 CRCL 36 (SUTURE) ×4 IMPLANT
SUT PROLENE 5 0 C 1 36 (SUTURE) IMPLANT
SUT PROLENE 6 0 C 1 30 (SUTURE) IMPLANT
SUT PROLENE 6 0 CC (SUTURE) ×12 IMPLANT
SUT PROLENE 8 0 BV175 6 (SUTURE) IMPLANT
SUT PROLENE BLUE 7 0 (SUTURE) ×4 IMPLANT
SUT SILK  1 MH (SUTURE) ×8
SUT SILK 1 MH (SUTURE) IMPLANT
SUT SILK 2 0 SH CR/8 (SUTURE) IMPLANT
SUT SILK 3 0 SH CR/8 (SUTURE) IMPLANT
SUT STEEL 6MS V (SUTURE) ×10 IMPLANT
SUT STEEL SZ 6 DBL 3X14 BALL (SUTURE) ×6 IMPLANT
SUT VIC AB 1 CTX 18 (SUTURE) ×2 IMPLANT
SUT VIC AB 1 CTX 36 (SUTURE) ×12
SUT VIC AB 1 CTX36XBRD ANBCTR (SUTURE) ×8 IMPLANT
SUT VIC AB 2-0 CT1 27 (SUTURE)
SUT VIC AB 2-0 CT1 TAPERPNT 27 (SUTURE) IMPLANT
SUT VIC AB 2-0 CTX 27 (SUTURE) IMPLANT
SUT VIC AB 3-0 X1 27 (SUTURE) IMPLANT
SYSTEM SAHARA CHEST DRAIN ATS (WOUND CARE) ×6 IMPLANT
TAPE CLOTH SURG 4X10 WHT LF (GAUZE/BANDAGES/DRESSINGS) ×2 IMPLANT
TAPE PAPER 2X10 WHT MICROPORE (GAUZE/BANDAGES/DRESSINGS) ×2 IMPLANT
TOWEL GREEN STERILE (TOWEL DISPOSABLE) ×6 IMPLANT
TOWEL GREEN STERILE FF (TOWEL DISPOSABLE) ×4 IMPLANT
TRAY FOLEY SILVER 16FR TEMP (SET/KITS/TRAYS/PACK) ×4 IMPLANT
TUBE 1/2X3/32 6FT (MISCELLANEOUS) ×4 IMPLANT
TUBE CONNECTING 12'X1/4 (SUCTIONS) ×1
TUBE CONNECTING 12X1/4 (SUCTIONS) ×1 IMPLANT
TUBING INSUFFLATION (TUBING) ×4 IMPLANT
UNDERPAD 30X30 (UNDERPADS AND DIAPERS) ×6 IMPLANT
WATER STERILE IRR 1000ML POUR (IV SOLUTION) ×12 IMPLANT
YANKAUER SUCT BULB TIP NO VENT (SUCTIONS) ×2 IMPLANT

## 2017-07-11 NOTE — Consult Note (Signed)
Advanced Heart Failure Team Consult Note  Primary Cardiologist:  Dr. Percival Spanish (Last seen in 2014)  Reason for Consultation: Cardiogenic shock post op  HPI:    [name] is seen today for evaluation of cardiogenic shock at the request of Dr. [name].   [name] is a 77 y.o. female with history of fusiform ascending aortic aneurysm, HTN, and HLD.   Pt presented for planned AVR and replacement of ascending aorta aneurysm. PT initially did well. Pt had Vfib arrest approx 1955 and Code blue initiated. Shocked x 1 with ROSC. Overnight pt developed acidosis with low UOP and increased pressor requirements. Taken back to OR this am for evacuation of hematoma and placement of Centromag.   Pt currently intubated and sedated.  RVAD at 2600 rpm with ~3.4 L of output. Remains on 8 of epi, 14 of Norepi, and milrinone 0.25 mcg/kg/min.   Echo 06/02/2017 LVEF 55-60%, Grade 1 DD, Mild/Mod AI with LVOT, Mid ascending aortic diameter 48.88 mm, Mild LAE, RV normal  R/LHC 06/13/2017 - Normal coronaries. Separate ostia for LAD and dominant LCX. RCA imaged non-selectivity. Large ascending aortic aneurysm.  Ao = 142/59 (93) LV =  149/10 RA =  1 RV =  32/2 PA =  30/5 (18) PCW = 10 Fick cardiac output/index = 7.0/3.8 PVR = 1.2 WU Ao sat = 99% PA sat = 73%, 74% SVC sat = 75%  ROS: Patient is encephalopathic and/or intubated. History has been obtained from chart review.   Home Medications Prior to Admission medications   Medication Sig Start Date End Date Taking? Authorizing Provider  aspirin 81 MG tablet Take 1 tablet (81 mg total) by mouth every other day. Patient taking differently: Take 81 mg by mouth at bedtime.  01/13/14  Yes Chipper Herb, MD  atenolol (TENORMIN) 50 MG tablet TAKE 1 TABLET DAILY Patient taking differently: TAKE 1 TABLET BY MOUTH DAILY 02/14/17  Yes Chipper Herb, MD  gabapentin (NEURONTIN) 100 MG capsule Take 100 mg by mouth at bedtime.   Yes [provider]  levothyroxine (SYNTHROID, LEVOTHROID) 100 MCG tablet Take 100 mcg by mouth every other day. Alternates and takes 100 mcg one day and 112 mcg the next   Yes [provider]  levothyroxine (SYNTHROID, LEVOTHROID) 112 MCG tablet Take 112 mcg by mouth daily before breakfast. Alternates and takes 100 mcg one day and 112 mcg the next 04/19/17  Yes [provider]  Omega 3 1200 MG CAPS Take 1,200 mg by mouth at bedtime.   Yes [provider]  polyethylene glycol (MIRALAX / GLYCOLAX) packet Take 17 g by mouth daily.    Yes [provider]  PROCTOZONE-HC 2.5 % rectal cream APPLY TO RECTALLY 2 TIMES A DAY AS NEEDED Patient taking differently: APPLY TO RECTALLY 1 TIMES A DAY AT BEDTIME 03/17/17  Yes Chipper Herb, MD  Simethicone (GAS-X PO) Take 1 tablet by mouth daily as needed (GAS).   Yes [provider]  simvastatin (ZOCOR) 80 MG tablet TAKE ONE TABLET AT BEDTIME Patient taking differently: TAKE ONE HALF (40 MG) TABLET BY MOUTH DAILY 07/25/16  Yes Chipper Herb, MD  TRAVATAN Z 0.004 % SOLN ophthalmic solution INSTILL 1 DROP INTO RIGHT EYE AT BEDTIME Patient taking differently: INSTILL 1 DROP INTO BOTH EYES AT BEDTIME   Yes Chipper Herb, MD  venlafaxine XR (EFFEXOR-XR) 75 MG 24 hr capsule TAKE (1) CAPSULE DAILY 06/06/17  Yes Chipper Herb, MD  zolpidem (AMBIEN) 10 MG tablet TAKE 1/2 TABLET AT BEDTIME AS NEEDED Patient taking differently: TAKE 0.5 TABLET (5 MG) BY MOUTH AT BEDTIME. 08/03/16  Yes Chipper Herb, MD  celecoxib (CELEBREX) 200 MG capsule TAKE (1) CAPSULE DAILY Patient not taking: Reported on 06/28/2017 03/17/17   Chipper Herb, MD  clobetasol cream (TEMOVATE) 6.65 % Apply 1 application topically 2 (two) times daily. Patient not taking: Reported on 06/28/2017 01/17/17   Claretta Fraise, MD  clorazepate (TRANXENE) 7.5 MG tablet TAKE 1 TABLET DAILY AS NEEDED Patient taking differently: TAKE 1 TABLET DAILY AS NEEDED FOR PAIN 06/07/17    Chipper Herb, MD  docusate sodium (COLACE) 100 MG capsule Take 2 capsules (200 mg total) by mouth 2 (two) times daily. Patient not taking: Reported on 06/28/2017 04/08/17 04/08/18  Stasia Cavalier, MD  famotidine (PEPCID) 20 MG tablet Take 1 tablet (20 mg total) by mouth 2 (two) times daily. Patient not taking: Reported on 06/28/2017 12/02/16   Eustaquio Maize, MD  fluticasone [facility]) 50 MCG/ACT nasal spray Place 2 sprays into both nostrils daily. Patient not taking: Reported on 06/28/2017 05/19/17   Eustaquio Maize, MD  mupirocin ointment (BACTROBAN) 2 % Apply 1 application topically 2 (two) times daily. Patient not taking: Reported on 06/28/2017 03/21/17   Chipper Herb, MD  traMADol (ULTRAM) 50 MG tablet Take 1-2 tablets (50-100 mg total) by mouth every 6 (six) hours as needed for moderate pain. Patient not taking: Reported on 06/28/2017 04/07/17   Ardis Hughs, MD  triamcinolone cream (KENALOG) 0.1 % Apply 1 application topically 3 (three) times daily. Avoid face and genitalia Patient not taking: Reported on 06/28/2017 01/17/17   Claretta Fraise, MD    Past Medical History: Past Medical History:  Diagnosis Date  . ACL tear    right  Dr. Gladstone Pih   . Adenomatous polyp   . Anxiety   . Aorta aneurysm ([place])   . Arthritis   . Ascending aortic aneurysm ([place])    note per chart per Dr Lucianne Lei Tright 4.7 cm 04/15/2015   . B12 deficiency   . Cancer ([place])   . Cataracts, both eyes 10/2006  . Chronic bronchitis ([place])   . Constipation   . Depression   . DUB (dysfunctional uterine bleeding) 10/96  . ETD (eustachian tube dysfunction)   . Fall   . Fibromyalgia   . GERD (gastroesophageal reflux disease)   . Glaucoma    ([place]) Dr. Arnoldo Morale   . Glaucoma    bilaterally  . Helicobacter pylori (H. pylori)   . Hiatal hernia   . History of bronchitis   . History of frequent urinary tract infections   . History of kidney stones   . History of left shoulder fracture    pt states fell off bed and  broke ball in shoulder had rod placed   . Hyperlipidemia   . Hypertension   . Hypothyroidism   . Insomnia   . Leg wound, left    healed   . MVP (mitral valve prolapse)   . Occasional tremors    left arm   . Osteoarthritis   . Osteopenia   . Osteoporosis   . Other and unspecified hyperlipidemia   . Post-menopausal   . Thyroid cancer ([place])   . Thyroid nodule   . Tremor     Past Surgical History: Past Surgical History:  Procedure Laterality Date  . APPENDECTOMY    . BUNIONECTOMY  08/1999   right -  Dr. Irving Shows   . CHOLECYSTECTOMY  1979  . DILATION AND CURETTAGE OF UTERUS  03/31/95   Dr. Ovid Curd   . EYE SURGERY     also had left cataract removed   . FRACTURE SURGERY    . HERNIA REPAIR    . NEPHROLITHOTOMY Left 04/07/2017   Procedure: NEPHROLITHOTOMY PERCUTANEOUS WITH SURGEON ACCESS;  Surgeon: Ardis Hughs, MD;  Location: [facility];  Service: Urology;  Laterality: Left;  . ORIF HUMERUS FRACTURE  05/30/2011   Procedure: OPEN REDUCTION INTERNAL FIXATION (ORIF) PROXIMAL HUMERUS FRACTURE;  Surgeon: Augustin Schooling;  Location: [place];  Service: Orthopedics;  Laterality: Left;  open reduction internal fixation of proximal humerus fracture  . right knee replacement  3/11   Dr. Gladstone Pih  . RIGHT/LEFT HEART CATH AND CORONARY ANGIOGRAPHY N/A 06/13/2017   Procedure: RIGHT/LEFT HEART CATH AND CORONARY ANGIOGRAPHY;  Surgeon: Jolaine Artist, MD;  Location: [place] [facility];  Service: Cardiovascular;  Laterality: N/A;  . rt. eye cataract  05/28/07  . THYROID LOBECTOMY Left 07/27/2015   Procedure: LEFT THYROID LOBECTOMY;  Surgeon: Fanny Skates, MD;  Location: [facility];  Service: General;  Laterality: Left;  . THYROIDECTOMY N/A 08/06/2015   Procedure: RIGHT THYROID LOBECTOMY, REIMPLANTATION PARATHYROID;  Surgeon: Fanny Skates, MD;  Location: [facility];  Service: General;  Laterality: N/A;  . VENTRAL HERNIA REPAIR  2/99    Family History: Family History  Problem Relation Age of Onset  .  Cancer Mother        PANCREATIC   . Osteoporosis Mother   . Hip fracture Mother   . Heart attack Father 63       Died 68  . Heart disease Father   . Arthritis Father   . Hypertension Sister   . Cancer Sister        skin  . Hypertension Sister   . Breast cancer Neg Hx     Social History: Social History   Socioeconomic History  . Marital status: Married    Spouse name: None  . Number of children: 1  . Years of education: None  . Highest education level: None  Social Needs  . Financial resource strain: None  . Food insecurity - worry: None  . Food insecurity - inability: None  . Transportation needs - medical: None  . Transportation needs - non-medical: None  Occupational History  . None  Tobacco Use  . Smoking status: Never Smoker  . Smokeless tobacco: Never Used  Substance and Sexual Activity  . Alcohol use: No  . Drug use: No  . Sexual activity: No  Other Topics Concern  . None  Social History Narrative   Lives with husband.     Allergies:  Allergies  Allergen Reactions  . Alendronate Sodium Nausea Only  . Levofloxacin Nausea And Vomiting  . Lipitor [Atorvastatin Calcium] Other (See Comments)    myalgias  . Lyrica [Pregabalin] Other (See Comments)    SORES IN MOUTH   . Meloxicam Other (See Comments)    Vaginal itching   . Sibutramine Hcl Monohydrate Other (See Comments)    Reaction unknown  . Actonel [Risedronate] Other (See Comments)    Bone pain and nausea   . Latex Rash    RA to latex 1982; includes bandaids      Objective:    Vital Signs:   Temp:  [91.4 F (33 C)-99.7 F (37.6 C)] 99.7 F (37.6 C) (01/15 0530) Pulse Rate:  [82-138] 117 (01/15 0545) Resp:  [  12-55] 29 (01/15 0545) BP: (80-151)/(56-77) 90/59 (01/15 0100) SpO2:  [66 %-100 %] 97 % (01/15 0545) Arterial Line BP: (72-140)/(10-74) 95/34 (01/15 0545) FiO2 (%):  [50 %-100 %] 50 % (01/15 0400) Last BM Date: (PTA)  Weight change: Filed Weights   07/10/17 0545  Weight: 168 lb  (76.2 kg)   Intake/Output:   Intake/Output Summary (Last 24 hours) at 07/11/2017 0915 Last data filed at 07/11/2017 0821 Gross per 24 hour  Intake 33364.09 ml  Output 3390 ml  Net 29974.09 ml    Physical Exam    General: Intubated/sedated.  HEENT: + ETT Neck: Supple. JVP to jaw. Carotids 2+ bilat; no bruits. No thyromegaly or nodule noted. Cor: PMI nondisplaced. RRR, No M/G/R noted. + RVAD sounds. Chest dressing C/D/I. Inflow cannula stable.  Lungs: Diminished basilar sounds Abdomen: Soft, non-distended, no HSM. No bruits or masses. +BS  Extremities: No cyanosis, clubbing, or rash. 1-2+ BLE edema. R groin outflow cannula stable.  Neuro: Intubated/sedated   Telemetry   Afib 120s, personally reviewed.   EKG    07/10/17 NSR with bifascicular block, personally reviewed  Labs   Basic Metabolic Panel: Recent Labs  Lab 07/06/17 1539  07/10/17 1234 07/10/17 1341 07/10/17 1505 07/10/17 2003 07/10/17 2011 07/11/17 0009 07/11/17 0259  NA 138   < > 141 145 147* 149*  --  144 142  K 3.7   < > 3.7 3.2* 2.9* 3.7  --  3.7 3.5  CL 101   < > 103 102  --  102  --  106 109  CO2 23  --   --   --   --   --   --   --  18*  GLUCOSE 104*   < > 223* 208* 136* 126*  --  174* 179*  BUN 11   < > 8 9  --  9  --  11 12  CREATININE 0.56   < > 0.50 0.50  --  0.60  --  0.60 1.00  CALCIUM 8.5*  --   --   --   --   --   --   --  7.6*  MG  --   --   --   --   --   --  2.7*  --  2.3   < > = values in this interval not displayed.    Liver Function Tests: Recent Labs  Lab 07/06/17 1539  AST 25  ALT 13*  ALKPHOS 92  BILITOT 0.9  PROT 6.8  ALBUMIN 4.3   No results for input(s): LIPASE, AMYLASE in the last 168 hours. No results for input(s): AMMONIA in the last 168 hours.  CBC: Recent Labs  Lab 07/06/17 1539  07/10/17 1229  07/10/17 1500 07/10/17 1505 07/10/17 2003 07/10/17 2010 07/11/17 0009 07/11/17 0259  WBC 5.4  --   --   --  7.5  --   --  9.9  --  10.7*  HGB 11.1*   < > 7.3*    < > 10.2* 9.9* 9.2* 10.4* 12.2 10.9*  HCT 37.1   < > 22.9*   < > 31.3* 29.0* 27.0* 31.2* 36.0 32.2*  MCV 82.6  --   --   --  82.4  --   --  83.2  --  82.6  PLT 265  --  85*  --  128*  --   --  176  --  200   < > = values in this interval not  displayed.    Cardiac Enzymes: Recent Labs  Lab 07/10/17 2011  CKTOTAL 396*    BNP: BNP (last 3 results) No results for input(s): BNP in the last 8760 hours.  ProBNP (last 3 results) No results for input(s): PROBNP in the last 8760 hours.   CBG: Recent Labs  Lab 07/11/17 0110 07/11/17 0217 07/11/17 0304 07/11/17 0400 07/11/17 0506  GLUCAP 162* 170* 172* 177* 159*    Coagulation Studies: Recent Labs    07/10/17 1500  LABPROT 19.5*  INR 1.67     Imaging   Dg Chest Port 1 View  Result Date: 07/11/2017 CLINICAL DATA:  Pulmonary edema. Status post aortic valve replacement. EXAM: PORTABLE CHEST 1 VIEW COMPARISON:  Chest radiograph July 10, 2017 FINDINGS: Cardiac silhouette is enlarged and unchanged. Status post median sternotomy for aortic valve replacement. Endotracheal tube tip projects 1.6 cm above the carina. Swan-Ganz catheter via RIGHT internal jugular venous approach with distal tip projecting in RIGHT ventricle. Nasogastric tube past proximal stomach with side port projecting at GE junction. Multiple wires and pacer pad over chest. Retrocardiac consolidation and small pleural effusions. Mild interstitial prominence and pulmonary vascular congestion. No pneumothorax. Status post tracheostomy. Surgical clips project in RIGHT axilla. LEFT humerus ORIF. Surgical clips in the included right abdomen compatible with cholecystectomy. IMPRESSION: Relatively stable appearance of life-support lines. Small pleural effusions.  Retrocardiac consolidation. Stable cardiomegaly and mild pulmonary edema. Electronically Signed   By: Elon Alas M.D.   On: 07/11/2017 05:17   Dg Chest Port 1 View  Result Date: 07/10/2017 CLINICAL DATA:   Status post CPR EXAM: PORTABLE CHEST 1 VIEW COMPARISON:  Chest radiograph from earlier today. FINDINGS: Endotracheal tube tip is 3.3 cm above the carina. Enteric tube terminates in the proximal stomach. Intact sternotomy wires. Right internal jugular Swan-Ganz catheter terminates over right ventricular outflow tract/proximal main pulmonary artery. Cardiac valvular prosthesis is in place. Pacer pads overlie left chest. Skin staples and clips overlie the right axilla. Stable mild cardiomegaly. Apparent increased widening of the left superior mediastinum. No pneumothorax. No pleural effusion. Hazy parahilar opacities in both lungs appear increased. Probable right lateral third through fifth rib fractures. IMPRESSION: 1. Increased widening of the left superior mediastinal contour, cannot exclude mediastinal hematoma or vascular injury. Correlate with chest CT angiogram with IV contrast as clinically warranted. 2. Probable right lateral third through fifth rib fractures. No pneumothorax. 3. Stable cardiomegaly with worsening perihilar opacities, favor worsening pulmonary edema. 4. Support structures as detailed. Electronically Signed   By: Ilona Sorrel M.D.   On: 07/10/2017 20:39   Dg Chest Port 1 View  Result Date: 07/10/2017 CLINICAL DATA:  Post AVR EXAM: PORTABLE CHEST 1 VIEW COMPARISON:  Presumed day; 07/06/2017 FINDINGS: Grossly unchanged cardiac silhouette and mediastinal contours post aortic valve repair. Stable position of support apparatus including tip of right internal jugular central venous catheter overlying the superior aspect the right ventricle, no pneumothorax Pulmonary vascular appears slightly indistinct with cephalization of flow. Unchanged small effusions with associated bibasilar opacities, left greater than right. Unchanged bones including sequela of ORIF of the proximal aspect the left humerus, incompletely evaluated. Surgical clips overlie the thoracic inlet bilaterally. IMPRESSION: 1.  Suspected slightly worsened pulmonary edema with associated small bilateral effusions and bibasilar opacities, left greater than right, likely atelectasis. 2. Stable positioning of support apparatus including tip of right IJ PA catheter overlying the superior aspect of the right ventricle. No pneumothorax. Electronically Signed   By: Sandi Mariscal M.D.   On:  07/10/2017 18:46   Dg Chest Port 1 View  Result Date: 07/10/2017 CLINICAL DATA:  Status post aortic valve replacement, thoracic aneurysm EXAM: PORTABLE CHEST 1 VIEW COMPARISON:  Radiograph 07/06/2017, CT chest 05/24/2017 FINDINGS: Surgical clips in the region of the thyroid. Endotracheal tube tip is about 2.9 cm superior to the carina. Esophageal tube tip projects in the left upper quadrant. Interval sternotomy and aortic valve replacement. Right IJ Swan-Ganz catheter tip overlies the pulmonary outflow tract. Mild cardiomegaly. Small pleural effusions and bibasilar atelectasis. Surgical staples over the right chest. No definitive or significant pneumothorax is seen. Aortic atherosclerosis. Partially visible surgical screws in the left humeral head IMPRESSION: 1. Interim placement of support lines and tubes as above 2. Trace pleural effusions and mild bibasilar consolidations likely atelectasis 3. Interval sternotomy and valve replacement. Electronically Signed   By: Donavan Foil M.D.   On: 07/10/2017 15:15      Medications:     Current Medications: . [MAR Hold] acetaminophen  1,000 mg Oral Q6H   Or  . [MAR Hold] acetaminophen (TYLENOL) oral liquid 160 mg/5 mL  1,000 mg Per Tube Q6H  . [MAR Hold] aspirin EC  325 mg Oral Daily   Or  . [MAR Hold] aspirin  324 mg Per Tube Daily  . [MAR Hold] bisacodyl  10 mg Oral Daily   Or  . [MAR Hold] bisacodyl  10 mg Rectal Daily  . [MAR Hold] chlorhexidine gluconate (MEDLINE KIT)  15 mL Mouth Rinse BID  . [MAR Hold] Chlorhexidine Gluconate Cloth  6 each Topical Daily  . [MAR Hold] docusate sodium  200 mg  Oral Daily  . [MAR Hold] insulin regular  0-10 Units Intravenous TID WC  . [MAR Hold] latanoprost  1 drop Right Eye QHS  . [MAR Hold] levothyroxine  100 mcg Oral QODAY  . [MAR Hold] levothyroxine  112 mcg Oral QODAY  . [MAR Hold] mouth rinse  15 mL Mouth Rinse QID  . [MAR Hold] metoprolol tartrate  12.5 mg Oral BID   Or  . [MAR Hold] metoprolol tartrate  12.5 mg Per Tube BID  . norepinephrine (LEVOPHED) 4 mg/250 ml Adult Infusion (16 mcg/ml)  2-50 mcg/min Intravenous To OR  . [MAR Hold] pantoprazole  40 mg Oral Daily  . [MAR Hold] sodium chloride flush  10-40 mL Intracatheter Q12H  . [MAR Hold] sodium chloride flush  3 mL Intravenous Q12H     Infusions: . sodium chloride 20 mL/hr at 07/10/17 2000  . sodium chloride    . sodium chloride 20 mL/hr at 07/10/17 1515  . [MAR Hold] sodium chloride Stopped (07/10/17 1634)  . sodium chloride    . sodium chloride    . sodium chloride    . amiodarone 30 mg/hr (07/11/17 0553)  . [MAR Hold] cefUROXime (ZINACEF)  IV Stopped (07/10/17 2209)  . dexmedetomidine (PRECEDEX) IV infusion 0.7 mcg/kg/hr (07/11/17 0553)  . [MAR Hold] epinephrine 8 mcg/min (07/11/17 0742)  . insulin (NOVOLIN-R) infusion 11.4 Units/hr (07/11/17 0634)  . lactated ringers 20 mL/hr at 07/11/17 0553  . lactated ringers 20 mL/hr at 07/11/17 0553  . milrinone 0.25 mcg/kg/min (07/11/17 0553)  . [MAR Hold] nitroGLYCERIN Stopped (07/10/17 1523)  . [MAR Hold] norepinephrine (LEVOPHED) Adult infusion 9 mcg/min (07/11/17 0857)       Patient Profile   [name] is a 77 y.o. female with history of fusiform ascending aortic aneurysm, HTN, and HLD.   Admitted 07/10/17 for planned AVR and replacement of ascending aortic aneurysm. Developed  acute cardiogenic shock + VF arrest. Taken back to OR am of 07/11/17 for evacuation of hematoma and placement of RVAD.   Assessment/Plan   1. Cardiogenic shock s/p cardiac surgery with R>L CHF. - Pre-op Echo 06/02/2017 LVEF 55-60%, Grade 1  DD, Mild/Mod AI with LVOT, Mid ascending aortic diameter 48.88 mm, Mild LAE, RV normal - Tenuous but stabilized with RVAD in place - RVAD settings at 2600 flow and giving ~3.4-3.6 L of output - CVP 19-20. Letting equilabrate and auto-diurese now. Will need to diurese as she stabilizes. Possibly tomorrow. - Continue current pressor support of Epi 8, Levophed 14, and Milrinone 0.25 mcg/kg/min. Wean as tolerated.  2. VF arrest - Stable. Pads in place.  - Goal K > 4.0 and Mg > 2.0. Supp aggressively.  3. Atrial fibrillation - Was in NSR on arrival to unit.  - Will bolus with IV amiodarone - Follow electrolytes 4. Acute respiratory failure - In setting of 1 and 2. Intubated and sedated. Continue to follow. - Will need to diurese eventually.  5. S/p AVR and Ascending aortic aneurysm replacement.  - Per surgery.  6. AKI - Creatinine 0.6 -> 1.00. Continue to follow.  7. Hypokalemia - Ordered for 5 runs of K. Follow BMET.  Patient is critically ill and inmultiple organ failure. UOP improving now on support. We will follow along.   Medication concerns reviewed with patient and pharmacy team. Barriers identified: None at this time.  Length of Stay: 1  Annamaria Helling  07/11/2017, 9:15 AM  Advanced Heart Failure Team Pager [phone] (M-F; 7a - 4p)  Please contact [facility] for night-coverage after hours (4p -7a ) and weekends on [email or web address]  Agree.   77 y/o woman POD#1 Aortic root replacement and AVR. Developed tamponade/cardiogenic shock post-operatively and taken back to OR this morning for evacuation of hematoma and RV failure. Centrimag placed for RV support.   On return from [place] currently on multiple pressors. Pump flow ~3.5L on 2600. No chatter in lines. BP stable. Making urine. Has developed AF with RVR  On exam. Intubated. Critically ill  ETT in place. Sedated Cor irregular RV hum. Chest open dressing ok Multiple CTs and RVAD cannulas ok Ab distended  quiet Extremities pale 2+ edema  She has post-cardiotomy shock with take back to OR for tamponade and RVAD placement. Now stabilized on multiple pressors and making urine. Will bolus with amio for AF. Will need gente diuresis soon as pressures permit. Supp K. We will follow closely.   CRITICAL CARE Performed by: Glori Bickers  Total critical care time: 45 minutes  Critical care time was exclusive of separately billable procedures and treating other patients.  Critical care was necessary to treat or prevent imminent or life-threatening deterioration.  Critical care was time spent personally by me (independent of midlevel providers or residents) on the following activities: development of treatment plan with patient and/or surrogate as well as nursing, discussions with consultants, evaluation of patient's response to treatment, examination of patient, obtaining history from patient or surrogate, ordering and performing treatments and interventions, ordering and review of laboratory studies, ordering and review of radiographic studies, pulse oximetry and re-evaluation of patient's condition.  Glori Bickers, MD  4:00 PM

## 2017-07-11 NOTE — Progress Notes (Signed)
  Echocardiogram Echocardiogram Transesophageal has been performed.  [name] 07/11/2017, 6:36 AM

## 2017-07-11 NOTE — Progress Notes (Signed)
RT note-Patient taken from OR to [place], vital signs remained stable. Therapist in [place] at the bedside.

## 2017-07-11 NOTE — Progress Notes (Signed)
      [address]       [place] [address]             [phone]      Intubated, sedated  BP (!) 80/49   Pulse 82   Temp (!) 97.3 F (36.3 C)   Resp 14   Wt 168 lb (76.2 kg)   SpO2 99%   BMI 27.96 kg/m    Intake/Output Summary (Last 24 hours) at 07/11/2017 1759 Last data filed at 07/11/2017 1706 Gross per 24 hour  Intake 7648.59 ml  Output 3510 ml  Net 4138.59 ml   RVAD in place with flows of 3.25 L/min. CO= 2.8, CI= 1.5 Low UOP over last few hours  Hgb 8.7 Hct= 25, creatinine 0.8  Will give 1 unit PRBC Anticoagulation initiated per protocol  Terri Lipps C. Roxan Hockey, MD [facility] [phone]

## 2017-07-11 NOTE — Transfer of Care (Signed)
Immediate Anesthesia Transfer of Care Note  Patient: [name]  Procedure(s) Performed: TRANSESOPHAGEAL ECHOCARDIOGRAM (TEE) EXPLORATION POST OPERATIVE OPEN HEART (N/A Chest) PLACEMENT OF CENTRIMAG VENTRICULAR ASSIST DEVICE (Right ) EVACUATION HEMATOMA (N/A )  Patient Location: ICU  Anesthesia Type:General  Level of Consciousness: sedated and Patient remains intubated per anesthesia plan  Airway & Oxygen Therapy: Patient remains intubated per anesthesia plan and Patient placed on Ventilator (see vital sign flow sheet for setting)  Post-op Assessment: Report given to RN and Post -op Vital signs reviewed and stable  Post vital signs: Reviewed and stable  Last Vitals:  Vitals:   07/11/17 0945 07/11/17 0947  BP: 127/62   Pulse: 98 98  Resp: 14 (!) 33  Temp:  (!) 35.8 C  SpO2: 98% 100%    Last Pain:  Vitals:   07/11/17 0947  TempSrc: Core  PainSc:       Patients Stated Pain Goal: 2 (57/84/69 [id number])  Complications: No apparent anesthesia complications

## 2017-07-11 NOTE — Anesthesia Procedure Notes (Signed)
Date/Time: 07/11/2017 5:56 AM Performed by: Babs Bertin, CRNA Pre-anesthesia Checklist: Patient identified, Emergency Drugs available, Suction available and Patient being monitored Patient Re-evaluated:Patient Re-evaluated prior to induction Oxygen Delivery Method: Circle system utilized Preoxygenation: Pre-oxygenation with 100% oxygen Induction Type: Inhalational induction with existing ETT Placement Confirmation: positive ETCO2 and breath sounds checked- equal and bilateral

## 2017-07-11 NOTE — Brief Op Note (Addendum)
07/10/2017 - 07/11/2017  7:53 AM  PATIENT:  [name]  77 y.o. female  PRE-OPERATIVE DIAGNOSIS:  Tamponade  POST-OPERATIVE DIAGNOSIS:  Tamponade  PROCEDURE:  Procedure(s): TRANSESOPHAGEAL ECHOCARDIOGRAM (TEE) EXPLORATION POST OPERATIVE OPEN HEART (N/A) PLACEMENT OF RVAD AND EVACUATION OF HEMATOMA  Findings- small amount of anterior mediastinal hematoma      Main reason for patients increased inortope requirement was RV dysfunction, new from postop echo yesterday and associated with increased moderate TR      Centrimag RVAD cannulae placed in RA, PA with flows 3.5 L/min and improvement in hemodynamics  SURGEON:  Surgeon(s) and Role:    Ivin Poot, MD - Primary  PHYSICIAN ASSISTANT: WAYNE GOLD PA-C  ANESTHESIA:   general  EBL:  200 mL   BLOOD ADMINISTERED: 1 UNIT PRBC'S  DRAINS: 1 MEDIASTINAL CHEST TUBE   LOCAL MEDICATIONS USED:  NONE  SPECIMEN:  No Specimen  DISPOSITION OF SPECIMEN:  N/A  COUNTS:  YES  TOURNIQUET:  * No tourniquets in log *  DICTATION: .Other Dictation: Dictation Number PENDING  PLAN OF CARE: Admit to inpatient   PATIENT DISPOSITION:  ICU - intubated and hemodynamically stable.   Delay start of Pharmacological VTE agent (>24hrs) due to surgical blood loss or risk of bleeding: yes  COMPLICATIONS:NO KNOWN

## 2017-07-11 NOTE — Progress Notes (Signed)
Pharmacy Antibiotic Note  [name] is a 77 y.o. female with ascending aortic aneurysm  S/p AVR on 1/14. She is noted s/p VF arrest post OR with tamponade is s/p hematoma evacuation and RVAD placement  Pharmacy has been consulted for vancomycin dosing (surgical prophylaxis for the centrimag) -vancomycin 1250mg  given 1/14 in an and 1000mg  given at ~10pm -SCr= 1, CrCl ~ 50  Plan: -Vancomycin 1250mg  IV q24h (next dose at 8pm) for a goal trough of 10-15 -Will follow renal function, cultures and clinical progress   Weight: 168 lb (76.2 kg)  Temp (24hrs), Avg:97.2 F (36.2 C), Min:91.4 F (33 C), Max:99.7 F (37.6 C)  Recent Labs  Lab 07/06/17 1539  07/10/17 1234 07/10/17 1341 07/10/17 1500 07/10/17 2003 07/10/17 2010 07/11/17 0009 07/11/17 0259 07/11/17 0944  WBC 5.4  --   --   --  7.5  --  9.9  --  10.7* 9.1  CREATININE 0.56   < > 0.50 0.50  --  0.60  --  0.60 1.00  --    < > = values in this interval not displayed.    Estimated Creatinine Clearance: 49.6 mL/min (by C-G formula based on SCr of 1 mg/dL).    Allergies  Allergen Reactions  . Alendronate Sodium Nausea Only  . Levofloxacin Nausea And Vomiting  . Lipitor [Atorvastatin Calcium] Other (See Comments)    myalgias  . Lyrica [Pregabalin] Other (See Comments)    SORES IN MOUTH   . Meloxicam Other (See Comments)    Vaginal itching   . Sibutramine Hcl Monohydrate Other (See Comments)    Reaction unknown  . Actonel [Risedronate] Other (See Comments)    Bone pain and nausea   . Latex Rash    RA to latex 1982; includes bandaids       Thank you for allowing pharmacy to be a part of this patient's care.  Hildred Laser, Pharm D 07/11/2017 11:22 AM

## 2017-07-11 NOTE — Progress Notes (Signed)
After talking with Dr. [name], he plans to take the patient back to the OR for exploration of cardiac tamponade.  I attempted to call the husband of the patient to obtain consent, but he did not answer the phone.  Therefore, the daughter Tomi Bamberger has been made aware of the patient's changing condition and Dr. Lucianne Lei Trigt's plan, and she gives me verbal consent over the phone.  Candyce Churn RN verified consent.    0600 Bedside report given to [facility], CRNA, and patient transported to OR for procedure.

## 2017-07-11 NOTE — Progress Notes (Signed)
With initial assessment no brachial sheath. Lt radial a-line present unknown insertion date

## 2017-07-11 NOTE — Anesthesia Postprocedure Evaluation (Signed)
Anesthesia Post Note  Patient: [name]  Procedure(s) Performed: AORTIC VALVE REPLACEMENT (AVR) (N/A Chest) REPLACEMENT ASCENDING AORTA (N/A Chest) TRANSESOPHAGEAL ECHOCARDIOGRAM (TEE) (N/A )     Patient location during evaluation: ICU Anesthesia Type: General Level of consciousness: sedated Pain management: pain level controlled Vital Signs Assessment: vitals unstable Respiratory status: patient remains intubated per anesthesia plan Cardiovascular status: unstable Postop Assessment: no apparent nausea or vomiting Anesthetic complications: no Comments: Per notes, patient coded several hours into postop period on POD#0 after her pacemaker was turned off and she went into Vfib arrest. CPR lasted 5-6 minutes per documentation, ROSC achieved. In the following hours, patient was becoming acidotic and requiring increasing vasopressors. Patient brought back to OR for tamponade. RVAD placed.     Last Vitals:  Vitals:   07/11/17 0530 07/11/17 0545  BP:    Pulse: (!) 119 (!) 117  Resp: (!) 33 (!) 29  Temp: 37.6 C   SpO2: 97% 97%    Last Pain:  Vitals:   07/11/17 0500  TempSrc: Core  PainSc:                  Audry Pili

## 2017-07-11 NOTE — Anesthesia Preprocedure Evaluation (Addendum)
Anesthesia Evaluation  Patient identified by MRN, date of birth, ID band Patient awake    Reviewed: Allergy & Precautions, NPO status , Patient's Chart, lab work & pertinent test results, reviewed documented beta blocker date and time   History of Anesthesia Complications Negative for: history of anesthetic complications  Airway Mallampati: Intubated       Dental   Pulmonary  Remains on vent s/p AVR, prox aorta replacement yesterday   breath sounds clear to auscultation       Cardiovascular hypertension, Pt. on medications and Pt. on home beta blockers + Peripheral Vascular Disease (asc aortiic aneurysm, now s/p repair)  (-) CAD + dysrhythmias Ventricular Fibrillation  Rhythm:Regular Rate:Tachycardia  Pt is s/p AVR and prox aorta replacement, had Vfib arrest post op and now in tamponade    Neuro/Psych Anxiety Depression Sedated     GI/Hepatic Neg liver ROS, GERD  ,  Endo/Other  Hypothyroidism   Renal/GU negative Renal ROSH/o stones     Musculoskeletal  (+) Arthritis ,   Abdominal   Peds  Hematology Hb 10.9, plt 200k   Anesthesia Other Findings   Reproductive/Obstetrics                            Anesthesia Physical Anesthesia Plan  ASA: IV and emergent  Anesthesia Plan: General   Post-op Pain Management:    Induction: Intravenous  PONV Risk Score and Plan: 3 and Treatment may vary due to age or medical condition  Airway Management Planned: Oral ETT  Additional Equipment: Arterial line, PA Cath and TEE  Intra-op Plan:   Post-operative Plan: Post-operative intubation/ventilation  Informed Consent:   Only emergency history available  Plan Discussed with: CRNA and Surgeon  Anesthesia Plan Comments: (Plan routine monitors, existing A line, PA cath, GETA and TEE, post op ventilation)       Anesthesia Quick Evaluation

## 2017-07-11 NOTE — Progress Notes (Signed)
1 Day Post-Op Procedure(s) (LRB): AORTIC VALVE REPLACEMENT (AVR) (N/A) REPLACEMENT ASCENDING AORTA (N/A) TRANSESOPHAGEAL ECHOCARDIOGRAM (TEE) (N/A) Subjective: Called at 0430 patient developed acidosis, low urine output and needed increasing doses of pressors to maintain BP Scant chest tube output  Concern that patient has developed tamponade and returning to OR for evacuation of hematoma.   Objective: Vital signs in last 24 hours: Temp:  [91.4 F (33 C)-99.7 F (37.6 C)] 99.7 F (37.6 C) (01/15 0415) Pulse Rate:  [60-138] 113 (01/15 0415) Cardiac Rhythm: Normal sinus rhythm;Sinus tachycardia (01/15 0400) Resp:  [12-55] 33 (01/15 0415) BP: (80-151)/(51-77) 90/59 (01/15 0100) SpO2:  [66 %-100 %] 96 % (01/15 0415) Arterial Line BP: (72-140)/(10-74) 101/52 (01/15 0415) FiO2 (%):  [50 %-100 %] 50 % (01/15 0400) Weight:  [168 lb (76.2 kg)] 168 lb (76.2 kg) (01/14 0545)  Hemodynamic parameters for last 24 hours: PAP: (11-68)/(2-27) 24/14 CVP:  [0 mmHg-11 mmHg] 11 mmHg CO:  [2.4 L/min-4.3 L/min] 3.3 L/min CI:  [1.3 L/min/m2-1.9 L/min/m2] 1.8 L/min/m2  Intake/Output from previous day: 01/14 0701 - 01/15 0700 In: 30815.4 [I.V.:27523.4; TIRWE:3154; IV Piggyback:1450] Out: 3295 [Urine:2525; Chest Tube:770] Intake/Output this shift: Total I/O In: 2049.2 [I.V.:1699.2; IV Piggyback:350] Out: 0086 [Urine:1200; Chest Tube:20]  sedated on vent but moving extremities Warm legs  Lab Results: Recent Labs    07/10/17 2010 07/11/17 0009 07/11/17 0259  WBC 9.9  --  10.7*  HGB 10.4* 12.2 10.9*  HCT 31.2* 36.0 32.2*  PLT 176  --  200   BMET:  Recent Labs    07/11/17 0009 07/11/17 0259  NA 144 142  K 3.7 3.5  CL 106 109  CO2  --  18*  GLUCOSE 174* 179*  BUN 11 12  CREATININE 0.60 1.00  CALCIUM  --  7.6*    PT/INR:  Recent Labs    07/10/17 1500  LABPROT 19.5*  INR 1.67   ABG    Component Value Date/Time   PHART 7.307 (L) 07/11/2017 0401   HCO3 19.0 (L) 07/11/2017  0401   TCO2 20 (L) 07/11/2017 0401   ACIDBASEDEF 7.0 (H) 07/11/2017 0401   O2SAT 94.0 07/11/2017 0401   CBG (last 3)  Recent Labs    07/11/17 0110 07/11/17 0217 07/11/17 0506  GLUCAP 162* 170* 159*    Assessment/Plan: S/P Procedure(s) (LRB): AORTIC VALVE REPLACEMENT (AVR) (N/A) REPLACEMENT ASCENDING AORTA (N/A) TRANSESOPHAGEAL ECHOCARDIOGRAM (TEE) (N/A) Re-exploration for post op tamponade after prior coagulopathy   LOS: 1 day    [name] 07/11/2017

## 2017-07-12 ENCOUNTER — Other Ambulatory Visit: Payer: Self-pay

## 2017-07-12 ENCOUNTER — Encounter (HOSPITAL_COMMUNITY): Payer: Self-pay | Admitting: Cardiothoracic Surgery

## 2017-07-12 ENCOUNTER — Inpatient Hospital Stay (HOSPITAL_COMMUNITY): Payer: Medicare Other

## 2017-07-12 DIAGNOSIS — J9601 Acute respiratory failure with hypoxia: Secondary | ICD-10-CM

## 2017-07-12 DIAGNOSIS — I712 Thoracic aortic aneurysm, without rupture: Principal | ICD-10-CM

## 2017-07-12 DIAGNOSIS — I50810 Right heart failure, unspecified: Secondary | ICD-10-CM

## 2017-07-12 DIAGNOSIS — I509 Heart failure, unspecified: Secondary | ICD-10-CM

## 2017-07-12 LAB — COOXEMETRY PANEL
Carboxyhemoglobin: 1.4 % (ref 0.5–1.5)
Carboxyhemoglobin: 1.4 % (ref 0.5–1.5)
Carboxyhemoglobin: 1.5 % (ref 0.5–1.5)
Methemoglobin: 0.8 % (ref 0.0–1.5)
Methemoglobin: 0.8 % (ref 0.0–1.5)
Methemoglobin: 1.1 % (ref 0.0–1.5)
O2 Saturation: 76.4 %
O2 Saturation: 72.7 %
O2 Saturation: 67.9 %
Total hemoglobin: 8.2 g/dL — ABNORMAL LOW (ref 12.0–16.0)
Total hemoglobin: 8.6 g/dL — ABNORMAL LOW (ref 12.0–16.0)
Total hemoglobin: 9 g/dL — ABNORMAL LOW (ref 12.0–16.0)

## 2017-07-12 LAB — POCT I-STAT 7, (LYTES, BLD GAS, ICA,H+H)
Acid-base deficit: 3 mmol/L — ABNORMAL HIGH (ref 0.0–2.0)
Acid-base deficit: 6 mmol/L — ABNORMAL HIGH (ref 0.0–2.0)
Bicarbonate: 20.3 mmol/L (ref 20.0–28.0)
Bicarbonate: 22.9 mmol/L (ref 20.0–28.0)
Calcium, Ion: 0.98 mmol/L — ABNORMAL LOW (ref 1.15–1.40)
Calcium, Ion: 1.26 mmol/L (ref 1.15–1.40)
HCT: 27 % — ABNORMAL LOW (ref 36.0–46.0)
HCT: 34 % — ABNORMAL LOW (ref 36.0–46.0)
Hemoglobin: 11.6 g/dL — ABNORMAL LOW (ref 12.0–15.0)
Hemoglobin: 9.2 g/dL — ABNORMAL LOW (ref 12.0–15.0)
O2 Saturation: 99 %
O2 Saturation: 99 %
Potassium: 3 mmol/L — ABNORMAL LOW (ref 3.5–5.1)
Potassium: 3.6 mmol/L (ref 3.5–5.1)
Sodium: 146 mmol/L — ABNORMAL HIGH (ref 135–145)
Sodium: 148 mmol/L — ABNORMAL HIGH (ref 135–145)
TCO2: 21 mmol/L — ABNORMAL LOW (ref 22–32)
TCO2: 24 mmol/L (ref 22–32)
pCO2 arterial: 41 mmHg (ref 32.0–48.0)
pCO2 arterial: 41.9 mmHg (ref 32.0–48.0)
pH, Arterial: 7.302 — ABNORMAL LOW (ref 7.350–7.450)
pH, Arterial: 7.346 — ABNORMAL LOW (ref 7.350–7.450)
pO2, Arterial: 142 mmHg — ABNORMAL HIGH (ref 83.0–108.0)
pO2, Arterial: 147 mmHg — ABNORMAL HIGH (ref 83.0–108.0)

## 2017-07-12 LAB — CBC
HCT: 24.2 % — ABNORMAL LOW (ref 36.0–46.0)
HCT: 28.6 % — ABNORMAL LOW (ref 36.0–46.0)
Hemoglobin: 8.6 g/dL — ABNORMAL LOW (ref 12.0–15.0)
Hemoglobin: 10 g/dL — ABNORMAL LOW (ref 12.0–15.0)
MCH: 28.6 pg (ref 26.0–34.0)
MCH: 28.5 pg (ref 26.0–34.0)
MCHC: 35.5 g/dL (ref 30.0–36.0)
MCHC: 35 g/dL (ref 30.0–36.0)
MCV: 80.4 fL (ref 78.0–100.0)
MCV: 81.5 fL (ref 78.0–100.0)
Platelets: 79 10*3/uL — ABNORMAL LOW (ref 150–400)
Platelets: 63 10*3/uL — ABNORMAL LOW (ref 150–400)
RBC: 3.01 MIL/uL — ABNORMAL LOW (ref 3.87–5.11)
RBC: 3.51 MIL/uL — ABNORMAL LOW (ref 3.87–5.11)
RDW: 15.5 % (ref 11.5–15.5)
RDW: 15.6 % — ABNORMAL HIGH (ref 11.5–15.5)
WBC: 12.5 10*3/uL — ABNORMAL HIGH (ref 4.0–10.5)
WBC: 13.9 10*3/uL — ABNORMAL HIGH (ref 4.0–10.5)

## 2017-07-12 LAB — CREATININE, SERUM
Creatinine, Ser: 0.92 mg/dL (ref 0.44–1.00)
GFR calc Af Amer: >60 mL/min (ref >60)
GFR calc non Af Amer: 59 mL/min — ABNORMAL LOW (ref >60)

## 2017-07-12 LAB — CALCIUM, IONIZED: Calcium, Ionized, Serum: 4.6 mg/dL (ref 4.5–5.6)

## 2017-07-12 LAB — POCT I-STAT 3, ART BLOOD GAS (G3+)
Acid-Base Excess: 3 mmol/L — ABNORMAL HIGH (ref 0.0–2.0)
Acid-Base Excess: 1 mmol/L (ref 0.0–2.0)
Acid-Base Excess: 1 mmol/L (ref 0.0–2.0)
Acid-Base Excess: 1 mmol/L (ref 0.0–2.0)
Acid-Base Excess: 1 mmol/L (ref 0.0–2.0)
Bicarbonate: 23.8 mmol/L (ref 20.0–28.0)
Bicarbonate: 27.3 mmol/L (ref 20.0–28.0)
Bicarbonate: 25.2 mmol/L (ref 20.0–28.0)
Bicarbonate: 24.9 mmol/L (ref 20.0–28.0)
Bicarbonate: 25 mmol/L (ref 20.0–28.0)
Bicarbonate: 25.1 mmol/L (ref 20.0–28.0)
O2 Saturation: 96 %
O2 Saturation: 96 %
O2 Saturation: 93 %
O2 Saturation: 91 %
O2 Saturation: 93 %
O2 Saturation: 93 %
Patient temperature: 36.1
Patient temperature: 36.4
Patient temperature: 36.3
Patient temperature: 36.6
Patient temperature: 36.9
Patient temperature: 36.8
TCO2: 25 mmol/L (ref 22–32)
TCO2: 29 mmol/L (ref 22–32)
TCO2: 26 mmol/L (ref 22–32)
TCO2: 26 mmol/L (ref 22–32)
TCO2: 26 mmol/L (ref 22–32)
TCO2: 26 mmol/L (ref 22–32)
pCO2 arterial: 32.8 mmHg (ref 32.0–48.0)
pCO2 arterial: 38.7 mmHg (ref 32.0–48.0)
pCO2 arterial: 37.3 mmHg (ref 32.0–48.0)
pCO2 arterial: 37.3 mmHg (ref 32.0–48.0)
pCO2 arterial: 36.7 mmHg (ref 32.0–48.0)
pCO2 arterial: 35.9 mmHg (ref 32.0–48.0)
pH, Arterial: 7.466 — ABNORMAL HIGH (ref 7.350–7.450)
pH, Arterial: 7.454 — ABNORMAL HIGH (ref 7.350–7.450)
pH, Arterial: 7.434 (ref 7.350–7.450)
pH, Arterial: 7.431 (ref 7.350–7.450)
pH, Arterial: 7.441 (ref 7.350–7.450)
pH, Arterial: 7.452 — ABNORMAL HIGH (ref 7.350–7.450)
pO2, Arterial: 70 mmHg — ABNORMAL LOW (ref 83.0–108.0)
pO2, Arterial: 77 mmHg — ABNORMAL LOW (ref 83.0–108.0)
pO2, Arterial: 62 mmHg — ABNORMAL LOW (ref 83.0–108.0)
pO2, Arterial: 57 mmHg — ABNORMAL LOW (ref 83.0–108.0)
pO2, Arterial: 63 mmHg — ABNORMAL LOW (ref 83.0–108.0)
pO2, Arterial: 64 mmHg — ABNORMAL LOW (ref 83.0–108.0)

## 2017-07-12 LAB — COMPREHENSIVE METABOLIC PANEL
ALT: 50 U/L (ref 14–54)
AST: 110 U/L — ABNORMAL HIGH (ref 15–41)
Albumin: 2.9 g/dL — ABNORMAL LOW (ref 3.5–5.0)
Alkaline Phosphatase: 40 U/L (ref 38–126)
Anion gap: 11 (ref 5–15)
BUN: 18 mg/dL (ref 6–20)
CO2: 23 mmol/L (ref 22–32)
Calcium: 7.6 mg/dL — ABNORMAL LOW (ref 8.9–10.3)
Chloride: 106 mmol/L (ref 101–111)
Creatinine, Ser: 0.94 mg/dL (ref 0.44–1.00)
GFR calc Af Amer: >60 mL/min (ref >60)
GFR calc non Af Amer: 58 mL/min — ABNORMAL LOW (ref >60)
Glucose, Bld: 145 mg/dL — ABNORMAL HIGH (ref 65–99)
Potassium: 3.7 mmol/L (ref 3.5–5.1)
Sodium: 140 mmol/L (ref 135–145)
Total Bilirubin: 1.3 mg/dL — ABNORMAL HIGH (ref 0.3–1.2)
Total Protein: 3.9 g/dL — ABNORMAL LOW (ref 6.5–8.1)

## 2017-07-12 LAB — GLUCOSE, CAPILLARY
Glucose-Capillary: 101 mg/dL — ABNORMAL HIGH (ref 65–99)
Glucose-Capillary: 102 mg/dL — ABNORMAL HIGH (ref 65–99)
Glucose-Capillary: 103 mg/dL — ABNORMAL HIGH (ref 65–99)
Glucose-Capillary: 104 mg/dL — ABNORMAL HIGH (ref 65–99)
Glucose-Capillary: 107 mg/dL — ABNORMAL HIGH (ref 65–99)
Glucose-Capillary: 108 mg/dL — ABNORMAL HIGH (ref 65–99)
Glucose-Capillary: 110 mg/dL — ABNORMAL HIGH (ref 65–99)
Glucose-Capillary: 112 mg/dL — ABNORMAL HIGH (ref 65–99)
Glucose-Capillary: 114 mg/dL — ABNORMAL HIGH (ref 65–99)
Glucose-Capillary: 115 mg/dL — ABNORMAL HIGH (ref 65–99)
Glucose-Capillary: 117 mg/dL — ABNORMAL HIGH (ref 65–99)
Glucose-Capillary: 117 mg/dL — ABNORMAL HIGH (ref 65–99)
Glucose-Capillary: 118 mg/dL — ABNORMAL HIGH (ref 65–99)
Glucose-Capillary: 118 mg/dL — ABNORMAL HIGH (ref 65–99)
Glucose-Capillary: 118 mg/dL — ABNORMAL HIGH (ref 65–99)
Glucose-Capillary: 119 mg/dL — ABNORMAL HIGH (ref 65–99)
Glucose-Capillary: 120 mg/dL — ABNORMAL HIGH (ref 65–99)
Glucose-Capillary: 120 mg/dL — ABNORMAL HIGH (ref 65–99)
Glucose-Capillary: 126 mg/dL — ABNORMAL HIGH (ref 65–99)
Glucose-Capillary: 131 mg/dL — ABNORMAL HIGH (ref 65–99)
Glucose-Capillary: 133 mg/dL — ABNORMAL HIGH (ref 65–99)
Glucose-Capillary: 133 mg/dL — ABNORMAL HIGH (ref 65–99)
Glucose-Capillary: 138 mg/dL — ABNORMAL HIGH (ref 65–99)
Glucose-Capillary: 148 mg/dL — ABNORMAL HIGH (ref 65–99)
Glucose-Capillary: 93 mg/dL (ref 65–99)

## 2017-07-12 LAB — POCT I-STAT, CHEM 8
BUN: 20 mg/dL (ref 6–20)
Calcium, Ion: 1.01 mmol/L — ABNORMAL LOW (ref 1.15–1.40)
Chloride: 99 mmol/L — ABNORMAL LOW (ref 101–111)
Creatinine, Ser: 0.9 mg/dL (ref 0.44–1.00)
Glucose, Bld: 103 mg/dL — ABNORMAL HIGH (ref 65–99)
HCT: 26 % — ABNORMAL LOW (ref 36.0–46.0)
Hemoglobin: 8.8 g/dL — ABNORMAL LOW (ref 12.0–15.0)
Potassium: 3.6 mmol/L (ref 3.5–5.1)
Sodium: 139 mmol/L (ref 135–145)
TCO2: 25 mmol/L (ref 22–32)

## 2017-07-12 LAB — POCT I-STAT EG7
Acid-base deficit: 2 mmol/L (ref 0.0–2.0)
Bicarbonate: 23.5 mmol/L (ref 20.0–28.0)
Calcium, Ion: 1.25 mmol/L (ref 1.15–1.40)
HCT: 24 % — ABNORMAL LOW (ref 36.0–46.0)
Hemoglobin: 8.2 g/dL — ABNORMAL LOW (ref 12.0–15.0)
O2 Saturation: 67 %
Potassium: 2.8 mmol/L — ABNORMAL LOW (ref 3.5–5.1)
Sodium: 150 mmol/L — ABNORMAL HIGH (ref 135–145)
TCO2: 25 mmol/L (ref 22–32)
pCO2, Ven: 44 mmHg (ref 44.0–60.0)
pH, Ven: 7.335 (ref 7.250–7.430)
pO2, Ven: 38 mmHg (ref 32.0–45.0)

## 2017-07-12 LAB — POCT ACTIVATED CLOTTING TIME
Activated Clotting Time: 0 seconds
Activated Clotting Time: 153 seconds
Activated Clotting Time: 153 seconds
Activated Clotting Time: 153 seconds
Activated Clotting Time: 153 seconds
Activated Clotting Time: 153 seconds
Activated Clotting Time: 158 seconds
Activated Clotting Time: 153 seconds
Activated Clotting Time: 158 seconds
Activated Clotting Time: 153 seconds
Activated Clotting Time: 153 seconds
Activated Clotting Time: 153 seconds
Activated Clotting Time: 153 seconds
Activated Clotting Time: 153 seconds
Activated Clotting Time: 158 seconds
Activated Clotting Time: 153 seconds
Activated Clotting Time: 158 seconds
Activated Clotting Time: 202 seconds
Activated Clotting Time: 153 seconds
Activated Clotting Time: 158 seconds
Activated Clotting Time: 153 seconds
Activated Clotting Time: 153 seconds
Activated Clotting Time: 153 seconds
Activated Clotting Time: 153 seconds

## 2017-07-12 LAB — POCT I-STAT 4, (NA,K, GLUC, HGB,HCT)
Glucose, Bld: 130 mg/dL — ABNORMAL HIGH (ref 65–99)
HCT: 28 % — ABNORMAL LOW (ref 36.0–46.0)
Hemoglobin: 9.5 g/dL — ABNORMAL LOW (ref 12.0–15.0)
Potassium: 3.2 mmol/L — ABNORMAL LOW (ref 3.5–5.1)
Sodium: 153 mmol/L — ABNORMAL HIGH (ref 135–145)

## 2017-07-12 LAB — HEMOGLOBIN AND HEMATOCRIT, BLOOD
HCT: 28.3 % — ABNORMAL LOW (ref 36.0–46.0)
Hemoglobin: 9.9 g/dL — ABNORMAL LOW (ref 12.0–15.0)

## 2017-07-12 LAB — MAGNESIUM
Magnesium: 2.1 mg/dL (ref 1.7–2.4)
Magnesium: 1.7 mg/dL (ref 1.7–2.4)

## 2017-07-12 LAB — APTT: aPTT: 107 seconds — ABNORMAL HIGH (ref 24–36)

## 2017-07-12 LAB — HEPARIN LEVEL (UNFRACTIONATED): Heparin Unfractionated: 0.12 IU/mL — ABNORMAL LOW (ref 0.30–0.70)

## 2017-07-12 LAB — PREPARE RBC (CROSSMATCH)

## 2017-07-12 MED ORDER — METOCLOPRAMIDE HCL 5 MG/ML IJ SOLN
10.0000 mg | Freq: Four times a day (QID) | INTRAMUSCULAR | Status: DC
  Administered 2017-07-12 – 2017-07-17 (×19): 10 mg via INTRAVENOUS
  Filled 2017-07-12 (×18): qty 2

## 2017-07-12 MED ORDER — METOCLOPRAMIDE HCL 5 MG/ML IJ SOLN: 10.0000 mg | Freq: Four times a day (QID) | INTRAMUSCULAR | Status: DC

## 2017-07-12 MED ORDER — FUROSEMIDE 10 MG/ML IJ SOLN
8.0000 mg/h | INTRAVENOUS | Status: DC
  Administered 2017-07-12 – 2017-07-13 (×2): 8 mg/h via INTRAVENOUS
  Filled 2017-07-12: qty 25
  Filled 2017-07-12: qty 20
  Filled 2017-07-12: qty 5

## 2017-07-12 MED ORDER — VITAL 1.5 CAL PO LIQD
1000.0000 mL | ORAL | Status: DC
  Administered 2017-07-12 – 2017-07-16 (×5): 1000 mL
  Filled 2017-07-12 (×9): qty 1000

## 2017-07-12 MED ORDER — FUROSEMIDE 10 MG/ML IJ SOLN
40.0000 mg | Freq: Once | INTRAMUSCULAR | Status: AC
  Administered 2017-07-12: 40 mg via INTRAVENOUS

## 2017-07-12 MED ORDER — FUROSEMIDE 10 MG/ML IJ SOLN
10.0000 mg | Freq: Once | INTRAMUSCULAR | Status: AC
  Administered 2017-07-12: 10 mg via INTRAVENOUS
  Filled 2017-07-12: qty 2

## 2017-07-12 MED ORDER — POTASSIUM CHLORIDE 10 MEQ/50ML IV SOLN
10.0000 meq | INTRAVENOUS | Status: AC | PRN
  Administered 2017-07-13 (×3): 10 meq via INTRAVENOUS
  Filled 2017-07-12 (×3): qty 50

## 2017-07-12 NOTE — Progress Notes (Addendum)
Advanced Heart Failure Rounding Note  Primary Cardiologist: Dr. Percival Spanish (2014) HF: (New) Dr. Haroldine Laws   Subjective:    Events - 07/10/17 S/p AVR and ascending AA replacement  - 07/10/17 VF Arrest in evening with shock x 1 - 07/11/17 RVAD placement   Coox 72.7% this am on Norepi 25, Epi 8, and milrinone 0.25.  CVP 15-16.  Remains intubated and sedated. Vent at 100 FIO2 with sats 95% CXR with diffuse infiltrates R>L (Personally reviewed)   Ordered for 1 uPRBC and 10 mg IV lasix x 1. Lines and drains stable.   Made 1240 cc of urine yesterday.   RVAD - 2600 rpm, 3.5-3.6 L of flow.  Echo 06/02/2017 LVEF 55-60%, Grade 1 DD, Mild/Mod AI with LVOT, Mid ascending aortic diameter 48.88 mm, Mild LAE, RV normal  Objective:   Weight Range: 203 lb 7.8 oz (92.3 kg) Body mass index is 33.86 kg/m.   Vital Signs:   Temp:  [96.1 F (35.6 C)-97.7 F (36.5 C)] 97.2 F (36.2 C) (01/16 0600) Pulse Rate:  [46-168] 93 (01/16 0600) Resp:  [7-54] 14 (01/16 0600) BP: (78-127)/(49-67) 106/59 (01/16 0600) SpO2:  [94 %-100 %] 96 % (01/16 0600) Arterial Line BP: (73-221)/(36-214) 112/51 (01/16 0600) FiO2 (%):  [50 %-70 %] 70 % (01/16 0458) Weight:  [203 lb 7.8 oz (92.3 kg)] 203 lb 7.8 oz (92.3 kg) (01/16 0500) Last BM Date: (pre op)  Weight change: Filed Weights   07/10/17 0545 07/12/17 0500  Weight: 168 lb (76.2 kg) 203 lb 7.8 oz (92.3 kg)    Intake/Output:   Intake/Output Summary (Last 24 hours) at 07/12/2017 0738 Last data filed at 07/12/2017 0700 Gross per 24 hour  Intake 7696.87 ml  Output 2640 ml  Net 5056.87 ml    Physical Exam    General: Intubated/sedated.  HEENT: + ETT Neck: Supple. JVP to jaw. Carotids 2+ bilat; no bruits. No thyromegaly or nodule noted. Cor: PMI nondisplaced. RRR, No M/G/R noted. + RVAD sounds. Chest dressing C/D/I. Inflow cannula of RVAD stable.  Lungs: Diminished basilar sounds. + Mechanical breathing sounds.  Abdomen: Soft, non-tender,  non-distended, no HSM. No bruits or masses. +BS  Extremities: No cyanosis, clubbing, or rash. 1-2+ BLE edema. R groin outflow cannula stable. No chatter in lines Neuro: Intubated/sedated   Telemetry   NSR 90s, personally reviewed  EKG    No new tracings.  Labs    CBC Recent Labs    07/11/17 1552 07/11/17 1604 07/12/17 0323  WBC 10.1  --  12.5*  HGB 8.7* 7.5* 8.6*  HCT 25.0* 22.0* 24.2*  MCV 80.4  --  80.4  PLT 101*  --  79*   Basic Metabolic Panel Recent Labs    07/11/17 0944  07/11/17 1552 07/11/17 1604 07/12/17 0323  NA 143   < >  --  143 140  K 2.8*   < >  --  3.8 3.7  CL 107  --   --  102 106  CO2 23  --   --   --  23  GLUCOSE 126*   < >  --  130* 145*  BUN 13  --   --  16 18  CREATININE 0.96  --   --  0.80 0.94  CALCIUM 8.3*  --   --   --  7.6*  MG  --    < > 2.6*  --  2.1   < > = values in this interval not displayed.   Liver  Function Tests Recent Labs    07/11/17 1037 07/12/17 0323  AST 150* 110*  ALT 70* 50  ALKPHOS 46 40  BILITOT 2.0* 1.3*  PROT 4.2* 3.9*  ALBUMIN 3.1* 2.9*   No results for input(s): LIPASE, AMYLASE in the last 72 hours. Cardiac Enzymes Recent Labs    07/10/17 2011  CKTOTAL 396*    BNP: BNP (last 3 results) No results for input(s): BNP in the last 8760 hours.  ProBNP (last 3 results) No results for input(s): PROBNP in the last 8760 hours.   D-Dimer Recent Labs    07/11/17 0944  DDIMER 1.06*   Hemoglobin A1C No results for input(s): HGBA1C in the last 72 hours. Fasting Lipid Panel No results for input(s): CHOL, HDL, LDLCALC, TRIG, CHOLHDL, LDLDIRECT in the last 72 hours. Thyroid Function Tests No results for input(s): TSH, T4TOTAL, T3FREE, THYROIDAB in the last 72 hours.  Invalid input(s): FREET3  Other results:   Imaging    Dg Chest Portable 1 View  Result Date: 07/11/2017 CLINICAL DATA:  Postop for line placement. EXAM: PORTABLE CHEST 1 VIEW COMPARISON:  Radiograph of same day. FINDINGS: Stable  cardiomegaly. Interval placement of right-sided chest tube without pneumothorax. Endoscopy device is noted, presumably in mid esophagus. Right internal jugular Swan-Ganz catheter is noted with distal tip in expected position of main pulmonary artery. Status post aortic valve repair. Minimal left basilar subsegmental atelectasis is noted. Bony thorax is unremarkable. Endotracheal tube is in grossly good position. Nasogastric tube appears to have been removed. Interval placement of left subclavian catheter with distal tip in expected position of SVC. IMPRESSION: Right-sided chest tube is noted without pneumothorax. Interval placement of left subclavian catheter with distal tip in expected position of the SVC. Endoscopy device appears to be projected over expected position of mid esophagus. Endotracheal tube and right internal jugular Swan-Ganz catheter in good position. Electronically Signed   By: Marijo Conception, M.D.   On: 07/11/2017 09:30      Medications:     Scheduled Medications: . acetaminophen  1,000 mg Oral Q6H   Or  . acetaminophen (TYLENOL) oral liquid 160 mg/5 mL  1,000 mg Per Tube Q6H  . aspirin EC  325 mg Oral Daily   Or  . aspirin  324 mg Per Tube Daily  . bisacodyl  10 mg Oral Daily   Or  . bisacodyl  10 mg Rectal Daily  . chlorhexidine gluconate (MEDLINE KIT)  15 mL Mouth Rinse BID  . Chlorhexidine Gluconate Cloth  6 each Topical Daily  . docusate sodium  200 mg Oral Daily  . fentaNYL (SUBLIMAZE) injection  50 mcg Intravenous Once  . furosemide  10 mg Intravenous Once  . insulin regular  0-10 Units Intravenous TID WC  . latanoprost  1 drop Right Eye QHS  . levothyroxine  100 mcg Oral QODAY  . levothyroxine  112 mcg Oral QODAY  . mouth rinse  15 mL Mouth Rinse 10 times per day  . metoprolol tartrate  12.5 mg Oral BID   Or  . metoprolol tartrate  12.5 mg Per Tube BID  . mupirocin ointment  1 application Topical BID  . pantoprazole (PROTONIX) IV  40 mg Intravenous Q24H  .  sodium chloride flush  3 mL Intravenous Q12H  . sodium chloride flush  3 mL Intravenous Q12H  . venlafaxine XR  75 mg Oral Daily     Infusions: . sodium chloride 10 mL/hr at 07/12/17 0700  . sodium chloride    .  sodium chloride 20 mL/hr at 07/12/17 0700  . albumin human    . amiodarone 30 mg/hr (07/12/17 0700)  . cefUROXime (ZINACEF)  IV Stopped (07/12/17 0322)  . dexmedetomidine 1.003 mcg/kg/hr (07/12/17 0700)  . EPINEPHrine 4 mg in dextrose 5% 250 mL infusion (16 mcg/mL) 8 mcg/min (07/12/17 0700)  . feeding supplement (VITAL 1.5 CAL)    . fentaNYL 50 mcg/hr (07/12/17 0700)  . heparin    . heparin    . heparin 800 Units/hr (07/12/17 0700)  . insulin (NOVOLIN-R) infusion 3.2 Units/hr (07/12/17 0707)  . insulin (NOVOLIN-R) infusion Stopped (07/11/17 1900)  . lactated ringers    . lactated ringers 20 mL/hr at 07/12/17 0700  . lactated ringers 10 mL/hr at 07/12/17 0700  . midazolam (VERSED) infusion 2 mg/hr (07/12/17 0700)  . milrinone 0.25 mcg/kg/min (07/12/17 0700)  . nitroGLYCERIN Stopped (07/11/17 1046)  . norepinephrine (LEVOPHED) Adult infusion 24.96 mcg/min (07/12/17 0700)  . vancomycin Stopped (07/11/17 2251)  . vasopressin (PITRESSIN) infusion - *FOR SHOCK*       PRN Medications:  sodium chloride, albumin human, bisacodyl, docusate, fentaNYL, lactated ringers, metoprolol tartrate, midazolam, morphine injection, ondansetron (ZOFRAN) IV, oxyCODONE, sodium chloride flush, sodium chloride flush, traMADol, vasopressin (PITRESSIN) infusion - *FOR SHOCK*    Patient Profile   [name] is a 77 y.o. female with history of fusiform ascending aortic aneurysm, HTN, and HLD.   Admitted 07/10/17 for planned AVR and replacement of ascending aortic aneurysm. Developed acute cardiogenic shock + VF arrest. Taken back to OR am of 07/11/17 for evacuation of hematoma and placement of RVAD.   Assessment/Plan   1. Cardiogenic shock s/p cardiac surgery with R>L CHF. - Pre-op Echo  06/02/2017 LVEF 55-60%, Grade 1 DD, Mild/Mod AI with LVOT, Mid ascending aortic diameter 48.88 mm, Mild LAE, RV normal - Tenuous but stabilized with RVAD in place - RVAD settings at 2600 flow with ~3.5 L of output.  - Remains on pressor support with levophed 25, Epi 8, and milrinone 0.25 mcg/kg/min.  CVP 15-16.  2. VF arrest - Stable. No further arrythmia.  - Goal K > 4.0 and Mg > 2.0. Supp aggressively. K 3.7 and Mg 2.1 this am.  3. Atrial fibrillation - Now back in NSR on IV amiodarone.  - Follow electrolytes 4. Acute respiratory failure - Intubated and sedated. Continue to follow.  - Will need to diurese eventually.  5. S/p AVR and Ascending aortic aneurysm replacement.  - Per surgery.  6. AKI - Creatinine stable. Continue to follow.  7. Hypokalemia - Much improved. Follow BMET daily.  Remains tenuous. Continue to wean pressors as tolerated. Will discuss diuresis with MD.   Medication concerns reviewed with patient and pharmacy team. Barriers identified: None at this time.   Length of Stay: 2  Annamaria Helling  07/12/2017, 7:38 AM  Advanced Heart Failure Team Pager [phone] (M-F; 7a - 4p)  Please contact [facility] for night-coverage after hours (4p -7a ) and weekends on [email or web address]  Agree.   She is critically ill. Remains on vent with 100FiO2 and triple pressors with RVAD support. Co-ox looks good. CXR with bilateral infiltrates R>L. Urine output improved but still up over 25 pounds.   RVAD flows look good. CVP high. No chatter in line. Renal function normal.   On exam Critically ill Intubated/sedated JVP up  RIJ swan Cor chest open. CTs and PA cannula ok  Lung crackles Ab obese soft. Reduced bowel sounds Ext: 2+ edema. Pale RFV cannula ok  She is on nearly max support but hanging in there. Now requiring 100% FiO2 with marginal PaO2. Will continue pressors. Attempt IV lasix to help oxygenation. May need lasix gtt. Continue IV amio for PAF - now in  NSR.  CRITICAL CARE Performed by: Glori Bickers  Total critical care time: 45 minutes  Critical care time was exclusive of separately billable procedures and treating other patients.  Critical care was necessary to treat or prevent imminent or life-threatening deterioration.  Critical care was time spent personally by me (independent of midlevel providers or residents) on the following activities: development of treatment plan with patient and/or surrogate as well as nursing, discussions with consultants, evaluation of patient's response to treatment, examination of patient, obtaining history from patient or surrogate, ordering and performing treatments and interventions, ordering and review of laboratory studies, ordering and review of radiographic studies, pulse oximetry and re-evaluation of patient's condition.   Glori Bickers, MD  9:59 PM

## 2017-07-12 NOTE — Progress Notes (Signed)
Per [name] place patient on [facility].

## 2017-07-12 NOTE — Care Management Note (Addendum)
Case Management Note  Patient Details  Name: [name] MRN: [id number] Date of Birth: [birth year]  Subjective/Objective:  From home admitted on 07/10/17 for planned AVR and replacement of ascending aortic aneurysm. Developed acute cardiogenic shock and VF arrest. Taken back to OR am of 07/11/17 for evacuation of hematoma and placement of RVAD.Remains intubated and sedated.   1/23 [place], BSN- PT is rec CIR consult, MD notified.    1/24 [place], BSN - reintubated, on full vent support, for possible trach next week.  conts on pressors and inotropes.   1/28 Tomi Bamberger RN, BSN - POD 7 RVAD removal, failed extubation on 124, now tolerating pressure support again, cvts requesting Trach, conts on milrinone, levophed, fentanyl drip and amio.  2/11 Tomi Bamberger RN, BSN - POD 21 RVAD removal, on trach collar at 28% since Saturday, toleratin PMV.  Passed MBS yesterday, advancing to dysphagia 1 diet, will change to cuffless 4 and work way to decannulation, progressing slowly with PT. Plan for CIR vs SNF.                 Action/Plan: NCM will follow progression for dc needs.  Expected Discharge Date:                  Expected Discharge Plan:     In-House Referral:     Discharge planning Services  CM Consult  Post Acute Care Choice:    Choice offered to:     DME Arranged:    DME Agency:     HH Arranged:    HH Agency:     Status of Service:  In process, will continue to follow  If discussed at Long Length of Stay Meetings, dates discussed:    Additional Comments:  Zenon Mayo, RN 07/12/2017, 11:18 AM

## 2017-07-12 NOTE — H&P (Signed)
[name], [name] NO.:  [id number]  MEDICAL RECORD NO.:  [id number]  LOCATION:  2H05C                        FACILITY:  [facility]  PHYSICIAN:  Ivin Poot, M.D.  DATE OF BIRTH:  07/27/1940  DATE OF ADMISSION:  07/10/2017 DATE OF DISCHARGE:                             HISTORY & PHYSICAL   OPERATIONS: 1. Replacement of 5.2-cm ascending aneurysm using a 30-mm Hemashield     platinum straight graft from the sinotubular junction to the     proximal arch. 2. Aortic valve replacement with a 21-mm [facility] Ease aortic     valve, serial [id number]. 3. Hypothermic circulatory arrest with antegrade cerebral perfusion     using right axillary artery cannulation.  SURGEON:  Ivin Poot, MD.  ASSISTANT:  John Giovanni, PA-C.  PREOPERATIVE DIAGNOSIS:  5.2-cm fusiform ascending aneurysm with moderate aortic insufficiency.  POSTOPERATIVE DIAGNOSIS:  5.2-cm fusiform ascending aneurysm with moderate aortic insufficiency.  ANESTHESIA:  General.  CLINICAL NOTE:  The patient is a 77 year old female who has been followed for a fusiform ascending aneurysm which has increased in size over the past 1-2 years.  She had associated moderate aortic insufficiency.  LV function was well preserved.  She was recommended for aortic valve replacement with combined repair of the large ascending aneurysm because of the risk of dissection.  The procedure, indications, benefits, alternatives, and risks were discussed with the patient in detail.  She understood the location of the surgical incision and the use of general anesthesia and cardiopulmonary bypass.  She understood the circulatory arrest was planned with antegrade cerebral perfusion and careful cerebral oximeter monitoring of brain perfusion.  She understood the risks of the surgery to include stroke, bleeding, blood transfusion requirement, organ failure, postoperative infection, postoperative pulmonary problems, and  death.  She agreed to proceed under what I felt was an informed consent.  OPERATIVE FINDINGS: 1. Large fusiform aneurysm successfully replaced with a 30-mm graft. 2. Aortic valve replacement with a 21-mm Edwards tissue valve for     moderate to severe aortic insufficiency with thin fenestrated     leaflets. 3. Preserved LV function and RV function after separation from     cardiopulmonary bypass with mild postoperative coagulopathy.  DESCRIPTION OF PROCEDURE:  The patient was brought from preop holding to the operating room and placed supine on the operating table.  General anesthesia was induced under invasive hemodynamic monitoring.  The chest, abdomen, and legs were prepped with Betadine and draped as a sterile field.  A right axillary incision was made and the right axillary artery was identified and encircled with vessel loops.  Care was taken to avoid injury to the brachial plexus.  Heparin 4000 units was administered and the axillary artery was clamped proximally and distally and an arteriotomy was made.  The combined graft-cannula was then sewn with running 5-0 Prolene and the clamps were removed.  The cannula was clamped.  A sternal incision was then made and the sternum was retracted.  The aorta was severely enlarged and thinned out.  The pericardium was elevated with sutures.  A pursestring was placed in the right atrium and heparin was administered.  The arterial  line to the axillary artery was connected to the bypass circuit.  The femoral venous line was connected to the bypass circuit and after the ACT was therapeutic, the patient was placed on cardiopulmonary bypass.  A left ventricular vent was placed via the right superior pulmonary vein.  Cardioplegia cannulas were placed both antegrade and retrograde cold blood cardioplegia and the patient was cooled to 24 degrees.  The aortic crossclamp was applied using the large clamp after a vessel loop was placed around  the innominate artery.  Cardioplegia was dosed mainly through the retrograde coronary sinus catheter because of significant AI.  Cold cardioplegic arrest was achieved with septal temperature dropped less than 14 degrees.  Cardioplegia was delivered every 20 minutes.  A transverse aortotomy was made above the sinotubular junction excising most of the aneurysm from beneath the crossclamp to the sinotubular junction.  The aortic valve was inspected.  It had 3 leaflets which were thin, myxomatous and had some fenestrations.  It was decided to replace the valve and the leaflets were removed.  The annulus was sized to a 21-mm pericardial valve.  The subannular 2-0 pledgeted sutures were placed and then placed through the valve sewing ring after the valve was prepared according to the protocol.  The valve had good confirmation of the anulus and there was no obstruction of the coronary ostia.  The left coronary ostium was very large and the right coronary ostium was a small slit-like opening, which was difficult to engage to the time of preoperative catheterization.  Next, a 30-mm graft was chosen to replace the aorta.  The proximal suture line was then created using a running 4-0 Prolene with interrupted 4-0 Prolene pledgeted sutures to reinforce the suture line for hemostasis, both on the inside of the suture line posteriorly and the anterior aspect of the suture line anteriorly.  At this point, the patient had been cooled to 24 degrees and circulatory arrest with antegrade cerebral perfusion was initiated.  A clamp was placed on the innominate artery and blood was drained from the pump after putting the patient in steep Trendelenburg position.  The distal aorta was opened.  It was resected just below the innominate artery.  The graft was then cut to the appropriate length and orientation and this was then sewn end to side with 2 layers using running 4-0 Prolene initially and then  reinforced with interrupted 4-0 pledgeted Prolene sutures to reinforce for hemostasis.  A vent was placed in the graft and then the patient was transitioned off circulatory arrest to full cardiopulmonary bypass by removing the clamp on the innominate artery.  Circulatory arrest time was 40 minutes.  The patient was then rewarmed and reperfused.  There was minimal bleeding from the suture lines.  Temporary pacing wires were applied. The cardioplegia cannulas were removed.  When the patient was adequately rewarmed and reperfused, she was weaned off cardiopulmonary bypass on low-dose milrinone without difficulty.  Biventricular function appeared to be normal.  There was no AI through the new valve.  There was persistent mild TR.  Cardiac output was satisfactory.  Protamine was administered without adverse reaction.  Anterior mediastinal chest tube was placed.  There was diffuse coagulopathy and the patient received blood product therapy with improvement.  The sternum was closed with wire.  The pectoralis fascia was closed with a running #1 Vicryl and the subcutaneous and skin layers were closed in running Vicryl.  The patient returned to the ICU in stable condition.  Ivin Poot, M.D.     PV/MEDQ  D:  07/12/2017  T:  07/12/2017  Job:  [id number]

## 2017-07-12 NOTE — Progress Notes (Signed)
Cortrak Tube Team Note:  Consult received to place a Cortrak feeding tube.   A 10 F Cortrak tube was placed in the left nare and secured with a nasal bridle at 90 cm. Per the Cortrak monitor reading the tube tip is post pyloric.   X-ray is required, abdominal x-ray has been ordered by the Cortrak team. Please confirm tube placement before using the Cortrak tube.   If the tube becomes dislodged please keep the tube and contact the Cortrak team at [email or web address] (password [email or web address]) for replacement.  If after hours and replacement cannot be delayed, place a NG tube and confirm placement with an abdominal x-ray.    Koleen Distance MS, RD, LDN Pager #- [phone] After Hours Pager: [phone]

## 2017-07-12 NOTE — Progress Notes (Signed)
1 Day Post-Op Procedure(s) (LRB): TRANSESOPHAGEAL ECHOCARDIOGRAM (TEE) EXPLORATION POST OPERATIVE OPEN HEART (N/A) PLACEMENT OF CENTRIMAG VENTRICULAR ASSIST DEVICE (Right) EVACUATION HEMATOMA (N/A) Subjective: Sedated on vent with IV sedation protocols in place Patient does respond and has moved all extremities Hemodynamic stable on RVAD, heparin 800 units per hour with ACT greater than 150 and PTT 60-70 Platelet count slowly dropping Creatinine stable but weight up and Lasix being administered Objective: Vital signs in last 24 hours: Temp:  [96.3 F (35.7 C)-98.6 F (37 C)] 98.4 F (36.9 C) (01/16 1800) Pulse Rate:  [80-98] 95 (01/16 1800) Cardiac Rhythm: Normal sinus rhythm (01/16 1600) Resp:  [14-22] 15 (01/16 1800) BP: (77-118)/(51-67) 110/61 (01/16 1800) SpO2:  [91 %-100 %] 94 % (01/16 1800) Arterial Line BP: (77-221)/(39-214) 108/52 (01/16 1800) FiO2 (%):  [70 %-100 %] 100 % (01/16 1637) Weight:  [203 lb 7.8 oz (92.3 kg)] 203 lb 7.8 oz (92.3 kg) (01/16 0500)  Hemodynamic parameters for last 24 hours: PAP: (26-40)/(17-28) 32/21 CVP:  [9 mmHg-19 mmHg] 12 mmHg CO:  [2.5 L/min] 2.5 L/min CI:  [1.3 L/min/m2] 1.3 L/min/m2  Intake/Output from previous day: 01/15 0701 - 01/16 0700 In: 8343.6 [I.V.:5580.2; Blood:1313.3; NG/GT:150; IV Piggyback:1300] Out: 2840 [Urine:1240; Emesis/NG output:350; Blood:300; Chest Tube:950] Intake/Output this shift: Total I/O In: 2129 [I.V.:1483.2; Blood:315; NG/GT:280.8; IV Piggyback:50] Out: 42 [Urine:716; Emesis/NG output:670; Chest Tube:320]       Exam    General- sedated,ble    Neck- no JVD, no cervical adenopathy palpable, no carotid bruit   Lungs- clear without rales, wheezes   Cor- regular rate and rhythm, no murmur , gallop   Abdomen- soft, non-tender   Extremities - warm, non-tender, minimal edema   Neuro- oriented, appropriate, no focal weakness   Lab Results: Recent Labs    07/12/17 0323  07/12/17 1606 07/12/17 1700   WBC 12.5*  --   --  13.9*  HGB 8.6*   < > 8.8* 10.0*  HCT 24.2*   < > 26.0* 28.6*  PLT 79*  --   --  63*   < > = values in this interval not displayed.   BMET:  Recent Labs    07/11/17 0944  07/12/17 0323 07/12/17 1606 07/12/17 1700  NA 143   < > 140 139  --   K 2.8*   < > 3.7 3.6  --   CL 107   < > 106 99*  --   CO2 23  --  23  --   --   GLUCOSE 126*   < > 145* 103*  --   BUN 13   < > 18 20  --   CREATININE 0.96   < > 0.94 0.90 0.92  CALCIUM 8.3*  --  7.6*  --   --    < > = values in this interval not displayed.    PT/INR:  Recent Labs    07/11/17 1552  LABPROT 19.9*  INR 1.71   ABG    Component Value Date/Time   PHART 7.452 (H) 07/12/2017 1824   HCO3 25.1 07/12/2017 1824   TCO2 26 07/12/2017 1824   ACIDBASEDEF 2.0 07/11/2017 0958   O2SAT 93.0 07/12/2017 1824   CBG (last 3)  Recent Labs    07/12/17 1456 07/12/17 1557 07/12/17 1701  GLUCAP 110* 108* 93    Assessment/Plan: S/P Procedure(s) (LRB): TRANSESOPHAGEAL ECHOCARDIOGRAM (TEE) EXPLORATION POST OPERATIVE OPEN HEART (N/A) PLACEMENT OF CENTRIMAG VENTRICULAR ASSIST DEVICE (Right) EVACUATION HEMATOMA (N/A) Patient needs calories, feeding tube  placed and vital 1.5 ordered   PTT slightly lower-will increase heparin to 850 units per hour this p.m. Continue with RV support and plan decannulation in 5 days.   LOS: 2 days    [name] 07/12/2017

## 2017-07-12 NOTE — Progress Notes (Signed)
[facility] BRIEF SICU PROGRESS NOTE  1 Day Post-Op  S/P Procedure(s) (LRB): TRANSESOPHAGEAL ECHOCARDIOGRAM (TEE) EXPLORATION POST OPERATIVE OPEN HEART (N/A) PLACEMENT OF CENTRIMAG VENTRICULAR ASSIST DEVICE (Right) EVACUATION HEMATOMA (N/A)   Sedated on vent NSR w/ stable hemodynamics and RVAD flows on milrinone @ 0.25, levo @ 19, Epi @8  O2 sats 95% on 100% FiO2 UOP 150-250 mL/hr last few hours  Plan: Continue current plan  Rexene Alberts, MD 07/12/2017 7:55 PM

## 2017-07-12 NOTE — Consult Note (Addendum)
.. ..  Name: [name] MRN: [id number] DOB: 1940-11-26    ADMISSION DATE:  07/10/2017  CONSULTATION DATE:  07/12/17  REFERRING MD :  Nils Pyle MD  CHIEF COMPLAINT:   BRIEF PATIENT DESCRIPTION: SIGNIFICANT EVENTS  77 yr old female POD 1 TEE, Open Heart with evacuation of Hematoma and placement of RVAD.  STUDIES:  TEE   HISTORY OF PRESENT ILLNESS:   77 year old female with past medical history significant for fusiform ascending aortic aneurysm, hypertension, hyperlipidemia, status post AVR and replacement of ascending aortic aneurysm.  On 07/10/2017 postprocedure patient developed acute cardiogenic shock had an episode of V. fib arrest was taken back to the OR on 07/11/2017 for evacuation of hematoma and placement of RVAD.  PCCM consulted for ventilator management. Pt is awake and intubated. HPI acquired from chart and other care providers.  PAST MEDICAL HISTORY :   has a past medical history of ACL tear, Adenomatous polyp, Anxiety, Aorta aneurysm ([place]), Arthritis, Ascending aortic aneurysm ([place]), B12 deficiency, Cancer ([place]), Cataracts, both eyes (10/2006), Chronic bronchitis ([place]), Constipation, Depression, DUB (dysfunctional uterine bleeding) (10/96), ETD (eustachian tube dysfunction), Fall, Fibromyalgia, GERD (gastroesophageal reflux disease), Glaucoma, Glaucoma, Helicobacter pylori (H. pylori), Hiatal hernia, History of bronchitis, History of frequent urinary tract infections, History of kidney stones, History of left shoulder fracture, Hyperlipidemia, Hypertension, Hypothyroidism, Insomnia, Leg wound, left, MVP (mitral valve prolapse), Occasional tremors, Osteoarthritis, Osteopenia, Osteoporosis, Other and unspecified hyperlipidemia, Post-menopausal, Thyroid cancer ([place]), Thyroid nodule, and Tremor.  has a past surgical history that includes right knee replacement (3/11); rt. eye cataract (05/28/07); Dilation and curettage of uterus (03/31/95); Cholecystectomy (1979); Ventral hernia repair  (2/99); Bunionectomy (08/1999); ORIF humerus fracture (05/30/2011); Eye surgery; Appendectomy; Fracture surgery; Thyroid lobectomy (Left, 07/27/2015); Thyroidectomy (N/A, 08/06/2015); Nephrolithotomy (Left, 04/07/2017); RIGHT/LEFT HEART CATH AND CORONARY ANGIOGRAPHY (N/A, 06/13/2017); Hernia repair; Aortic valve replacement (N/A, 07/10/2017); Replacement ascending aorta (N/A, 07/10/2017); TEE without cardioversion (N/A, 07/10/2017); TEE without cardioversion (07/11/2017); Exploration post operative open heart (N/A, 07/11/2017); Placement of centrimag ventricular assist device (Right, 07/11/2017); and Hematoma evacuation (N/A, 07/11/2017). Prior to Admission medications   Medication Sig Start Date End Date Taking? Authorizing Provider  aspirin 81 MG tablet Take 1 tablet (81 mg total) by mouth every other day. Patient taking differently: Take 81 mg by mouth at bedtime.  01/13/14  Yes Chipper Herb, MD  atenolol (TENORMIN) 50 MG tablet TAKE 1 TABLET DAILY Patient taking differently: TAKE 1 TABLET BY MOUTH DAILY 02/14/17  Yes Chipper Herb, MD  gabapentin (NEURONTIN) 100 MG capsule Take 100 mg by mouth at bedtime.   Yes [provider]  levothyroxine (SYNTHROID, LEVOTHROID) 100 MCG tablet Take 100 mcg by mouth every other day. Alternates and takes 100 mcg one day and 112 mcg the next   Yes [provider]  levothyroxine (SYNTHROID, LEVOTHROID) 112 MCG tablet Take 112 mcg by mouth daily before breakfast. Alternates and takes 100 mcg one day and 112 mcg the next 04/19/17  Yes [provider]  Omega 3 1200 MG CAPS Take 1,200 mg by mouth at bedtime.   Yes [provider]  polyethylene glycol (MIRALAX / GLYCOLAX) packet Take 17 g by mouth daily.    Yes [provider]  PROCTOZONE-HC 2.5 % rectal cream APPLY TO RECTALLY 2 TIMES A DAY AS NEEDED Patient taking differently: APPLY TO RECTALLY 1 TIMES A DAY AT BEDTIME 03/17/17  Yes Chipper Herb, MD  Simethicone (GAS-X PO) Take 1  tablet by mouth daily as needed (GAS).  Yes [provider]  simvastatin (ZOCOR) 80 MG tablet TAKE ONE TABLET AT BEDTIME Patient taking differently: TAKE ONE HALF (40 MG) TABLET BY MOUTH DAILY 07/25/16  Yes Chipper Herb, MD  TRAVATAN Z 0.004 % SOLN ophthalmic solution INSTILL 1 DROP INTO RIGHT EYE AT BEDTIME Patient taking differently: INSTILL 1 DROP INTO BOTH EYES AT BEDTIME   Yes Chipper Herb, MD  venlafaxine XR (EFFEXOR-XR) 75 MG 24 hr capsule TAKE (1) CAPSULE DAILY 06/06/17  Yes Chipper Herb, MD  zolpidem (AMBIEN) 10 MG tablet TAKE 1/2 TABLET AT BEDTIME AS NEEDED Patient taking differently: TAKE 0.5 TABLET (5 MG) BY MOUTH AT BEDTIME. 08/03/16  Yes Chipper Herb, MD  celecoxib (CELEBREX) 200 MG capsule TAKE (1) CAPSULE DAILY Patient not taking: Reported on 06/28/2017 03/17/17   Chipper Herb, MD  clobetasol cream (TEMOVATE) 1.61 % Apply 1 application topically 2 (two) times daily. Patient not taking: Reported on 06/28/2017 01/17/17   Claretta Fraise, MD  clorazepate (TRANXENE) 7.5 MG tablet TAKE 1 TABLET DAILY AS NEEDED Patient taking differently: TAKE 1 TABLET DAILY AS NEEDED FOR PAIN 06/07/17   Chipper Herb, MD  docusate sodium (COLACE) 100 MG capsule Take 2 capsules (200 mg total) by mouth 2 (two) times daily. Patient not taking: Reported on 06/28/2017 04/08/17 04/08/18  Stasia Cavalier, MD  famotidine (PEPCID) 20 MG tablet Take 1 tablet (20 mg total) by mouth 2 (two) times daily. Patient not taking: Reported on 06/28/2017 12/02/16   Eustaquio Maize, MD  fluticasone [facility]) 50 MCG/ACT nasal spray Place 2 sprays into both nostrils daily. Patient not taking: Reported on 06/28/2017 05/19/17   Eustaquio Maize, MD  mupirocin ointment (BACTROBAN) 2 % Apply 1 application topically 2 (two) times daily. Patient not taking: Reported on 06/28/2017 03/21/17   Chipper Herb, MD  traMADol (ULTRAM) 50 MG tablet Take 1-2 tablets (50-100 mg total) by mouth every 6 (six) hours as needed  for moderate pain. Patient not taking: Reported on 06/28/2017 04/07/17   Ardis Hughs, MD  triamcinolone cream (KENALOG) 0.1 % Apply 1 application topically 3 (three) times daily. Avoid face and genitalia Patient not taking: Reported on 06/28/2017 01/17/17   Claretta Fraise, MD   Allergies  Allergen Reactions  . Alendronate Sodium Nausea Only  . Levofloxacin Nausea And Vomiting  . Lipitor [Atorvastatin Calcium] Other (See Comments)    myalgias  . Lyrica [Pregabalin] Other (See Comments)    SORES IN MOUTH   . Meloxicam Other (See Comments)    Vaginal itching   . Sibutramine Hcl Monohydrate Other (See Comments)    Reaction unknown  . Actonel [Risedronate] Other (See Comments)    Bone pain and nausea   . Latex Rash    RA to latex 1982; includes bandaids      FAMILY HISTORY:  family history includes Arthritis in her father; Cancer in her mother and sister; Heart attack (age of onset: 43) in her father; Heart disease in her father; Hip fracture in her mother; Hypertension in her sister and sister; Osteoporosis in her mother. SOCIAL HISTORY:  reports that  has never smoked. she has never used smokeless tobacco. She reports that she does not drink alcohol or use drugs.  REVIEW OF SYSTEMS:  Unable to obtain due to encephalopathy  Constitutional: Negative for fever, chills, weight loss, malaise/fatigue and diaphoresis.  HENT: Negative for hearing loss, ear pain, nosebleeds, congestion, sore throat, neck pain, tinnitus and ear discharge.   Eyes: Negative  for blurred vision, double vision, photophobia, pain, discharge and redness.  Respiratory: Negative for cough, hemoptysis, sputum production, shortness of breath, wheezing and stridor.   Cardiovascular: Negative for chest pain, palpitations, orthopnea, claudication, leg swelling and PND.  Gastrointestinal: Negative for heartburn, nausea, vomiting, abdominal pain, diarrhea, constipation, blood in stool and melena.  Genitourinary: Negative  for dysuria, urgency, frequency, hematuria and flank pain.  Musculoskeletal: Negative for myalgias, back pain, joint pain and falls.  Skin: Negative for itching and rash.  Neurological: Negative for dizziness, tingling, tremors, sensory change, speech change, focal weakness, seizures, loss of consciousness, weakness and headaches.  Endo/Heme/Allergies: Negative for environmental allergies and polydipsia. Does not bruise/bleed easily.  SUBJECTIVE:   VITAL SIGNS: Temp:  [96.3 F (35.7 C)-98.6 F (37 C)] 98.2 F (36.8 C) (01/16 1900) Pulse Rate:  [84-98] 95 (01/16 1900) Resp:  [14-22] 16 (01/16 1900) BP: (77-118)/(51-67) 103/66 (01/16 1900) SpO2:  [91 %-98 %] 94 % (01/16 1900) Arterial Line BP: (77-221)/(39-214) 114/56 (01/16 1900) FiO2 (%):  [70 %-100 %] 100 % (01/16 1900) Weight:  [92.3 kg (203 lb 7.8 oz)] 92.3 kg (203 lb 7.8 oz) (01/16 0500)  PHYSICAL EXAMINATION: General:  Awake during my evaluation (sedation vacation), intubated Neuro:  No focal deficits, PERRLA, EOM intact HEENT:  NCAT, intubated ETT at lip at 19 moist oropharynx  Cardiovascular:  Open chest wall has Y tube exiting subxiphoid. RVAD at 43 RPM Lungs:  Good air entry bilateral minimal crackles at bases Abdomen:  Soft, non tender +BS Musculoskeletal:  No gross deformities. SCDs in place on lower ext Skin:  Grossly intact some ecchymoses noted.  Recent Labs  Lab 07/11/17 0259  07/11/17 0944  07/11/17 1604 07/12/17 0323 07/12/17 1606 07/12/17 1700  NA 142   < > 143   < > 143 140 139  --   K 3.5   < > 2.8*   < > 3.8 3.7 3.6  --   CL 109   < > 107  --  102 106 99*  --   CO2 18*  --  23  --   --  23  --   --   BUN 12   < > 13  --  16 18 20   --   CREATININE 1.00   < > 0.96  --  0.80 0.94 0.90 0.92  GLUCOSE 179*   < > 126*   < > 130* 145* 103*  --    < > = values in this interval not displayed.   Recent Labs  Lab 07/11/17 1552  07/12/17 0323 07/12/17 1158 07/12/17 1606 07/12/17 1700  HGB 8.7*   < >  8.6* 9.9* 8.8* 10.0*  HCT 25.0*   < > 24.2* 28.3* 26.0* 28.6*  WBC 10.1  --  12.5*  --   --  13.9*  PLT 101*  --  79*  --   --  63*   < > = values in this interval not displayed.   Dg Chest Port 1 View  Result Date: 07/12/2017 CLINICAL DATA:  Hypoxia EXAM: PORTABLE CHEST 1 VIEW COMPARISON:  July 11, 2017 FINDINGS: Endotracheal tube tip is 2.8 cm above the carina. Swan-Ganz catheter tip is in the main pulmonary outflow tract. Left subclavian catheter tip is in the superior vena cava. There is a mediastinal drain. Nasogastric tube tip and side port are in the stomach. There are coil containing catheter devices positioned in the superior vena cava and descending thoracic aortic regions. There is a chest  tube on the right. No evident pneumothorax. There is atelectatic change in lung bases with small left pleural effusion. There is also mild atelectatic change in the right upper lobe. Heart is upper normal in size with pulmonary vascularity within normal limits. Patient is status post aortic valve replacement. Postoperative changes noted in the left proximal humerus. There are also surgical clips consistent with thyroidectomy. IMPRESSION: Tube and catheter positions as described without pneumothorax. Bibasilar atelectatic change, more severe on the left than on the right. Small left pleural effusion. Also atelectatic change right upper lobe. Stable cardiac silhouette. Electronically Signed   By: Lowella Grip III M.D.   On: 07/12/2017 09:19   Dg Chest Portable 1 View  Result Date: 07/11/2017 CLINICAL DATA:  Postop for line placement. EXAM: PORTABLE CHEST 1 VIEW COMPARISON:  Radiograph of same day. FINDINGS: Stable cardiomegaly. Interval placement of right-sided chest tube without pneumothorax. Endoscopy device is noted, presumably in mid esophagus. Right internal jugular Swan-Ganz catheter is noted with distal tip in expected position of main pulmonary artery. Status post aortic valve repair. Minimal  left basilar subsegmental atelectasis is noted. Bony thorax is unremarkable. Endotracheal tube is in grossly good position. Nasogastric tube appears to have been removed. Interval placement of left subclavian catheter with distal tip in expected position of SVC. IMPRESSION: Right-sided chest tube is noted without pneumothorax. Interval placement of left subclavian catheter with distal tip in expected position of the SVC. Endoscopy device appears to be projected over expected position of mid esophagus. Endotracheal tube and right internal jugular Swan-Ganz catheter in good position. Electronically Signed   By: Marijo Conception, M.D.   On: 07/11/2017 09:30   Dg Chest Port 1 View  Result Date: 07/11/2017 CLINICAL DATA:  Pulmonary edema. Status post aortic valve replacement. EXAM: PORTABLE CHEST 1 VIEW COMPARISON:  Chest radiograph July 10, 2017 FINDINGS: Cardiac silhouette is enlarged and unchanged. Status post median sternotomy for aortic valve replacement. Endotracheal tube tip projects 1.6 cm above the carina. Swan-Ganz catheter via RIGHT internal jugular venous approach with distal tip projecting in RIGHT ventricle. Nasogastric tube past proximal stomach with side port projecting at GE junction. Multiple wires and pacer pad over chest. Retrocardiac consolidation and small pleural effusions. Mild interstitial prominence and pulmonary vascular congestion. No pneumothorax. Status post tracheostomy. Surgical clips project in RIGHT axilla. LEFT humerus ORIF. Surgical clips in the included right abdomen compatible with cholecystectomy. IMPRESSION: Relatively stable appearance of life-support lines. Small pleural effusions.  Retrocardiac consolidation. Stable cardiomegaly and mild pulmonary edema. Electronically Signed   By: Elon Alas M.D.   On: 07/11/2017 05:17   Dg Chest Port 1 View  Result Date: 07/10/2017 CLINICAL DATA:  Status post CPR EXAM: PORTABLE CHEST 1 VIEW COMPARISON:  Chest radiograph from  earlier today. FINDINGS: Endotracheal tube tip is 3.3 cm above the carina. Enteric tube terminates in the proximal stomach. Intact sternotomy wires. Right internal jugular Swan-Ganz catheter terminates over right ventricular outflow tract/proximal main pulmonary artery. Cardiac valvular prosthesis is in place. Pacer pads overlie left chest. Skin staples and clips overlie the right axilla. Stable mild cardiomegaly. Apparent increased widening of the left superior mediastinum. No pneumothorax. No pleural effusion. Hazy parahilar opacities in both lungs appear increased. Probable right lateral third through fifth rib fractures. IMPRESSION: 1. Increased widening of the left superior mediastinal contour, cannot exclude mediastinal hematoma or vascular injury. Correlate with chest CT angiogram with IV contrast as clinically warranted. 2. Probable right lateral third through fifth rib fractures. No pneumothorax.  3. Stable cardiomegaly with worsening perihilar opacities, favor worsening pulmonary edema. 4. Support structures as detailed. Electronically Signed   By: Ilona Sorrel M.D.   On: 07/10/2017 20:39   Dg Abd Portable 1v  Result Date: 07/12/2017 CLINICAL DATA:  Encounter for feeding tube placement EXAM: PORTABLE ABDOMEN - 1 VIEW COMPARISON:  Portable exam 7056 hours compared to earlier study of 1037 hours FINDINGS: Feeding tube coiled in proximal stomach with tip projecting over pylorus or duodenal bulb. Tip of nasogastric tube projects over proximal stomach. Tip of Swan-Ganz catheter projects over main pulmonary artery near bifurcation. Epicardial pacing leads and aortic valve prosthesis noted. Additional vascular lines project over lower chest and abdomen. Bones demineralized. Atelectasis versus infiltrate at LEFT base. IMPRESSION: Tip of feeding tube projects over expected position of the pylorus or duodenal bulb. Electronically Signed   By: Lavonia Dana M.D.   On: 07/12/2017 18:24   Dg Abd Portable  1v  Result Date: 07/12/2017 CLINICAL DATA:  Feeding tube placement. EXAM: PORTABLE ABDOMEN - 1 VIEW COMPARISON:  One-view chest x-ray 07/11/2016. FINDINGS: Tip of a small bore feeding tube is at the level of the duodenum. There is some redundancy in the fundus of the stomach. Vascular catheters are stable. IMPRESSION: The tip of a small bore feeding tube is at the duodenum. Electronically Signed   By: San Morelle M.D.   On: 07/12/2017 10:59    ASSESSMENT / PLAN: 77 year old female with PMHx significant for fusiform ascending aortic aneurysm, hypertension, hyperlipidemia, status post AVR and replacement of ascending aortic aneurysm.  On 07/10/2017 postprocedure patient developed acute cardiogenic shock had an episode of V. fib arrest was taken back to the OR on 07/11/2017 for evacuation of hematoma and placement of RVAD.  NEURO: Sedated  GCS 9 On Precedex ggt, Fentanylggt and Versed ggt Previously RASS goal -4 due to Cardiothoracic Sx Recommend Reassessing Recommend Daily sedation vacations and avoid paralytics if possible Pt appears comfortable not in any distress Pt has a h/o Vitamin B12 deficiency  Received 1036mcg inj in October 2018.  Also has a h/o fibromyalgia  Whenever possible restart Neurontin Previously On Venlafaxine XR (SSRI) and Also on Ambien 10mg   Would continue to hold Ambien and continue the Venlafaxine  CARDIAC: POD1 TEE, Evacuation of Hematoma and RVAD placement RVAD at 2600 RPMs ACT goal 150 PTT goal Mgmt of RVAD per Cardiothoracic and HF  Recommendations Chest tubes in MT and PT noted and draining - Output decreased from 950->320. Currently on avg 30cc/hr per nursing staff Vasopressors: Epi ,Levo, and Vaso Ionotrope: Milrinone Nitroglycerin ggt   PULMONARY: Hypoxic respiratory failure secondary to Cardiogenic Pulmonary edema On exam pt's lungs have minimal crackles Compliance on vent  ETT is at 19 at lip- confirmed position of ETT by CXR in  satisfactory position Continues on Mechanical ventilation 8cc/kg TV FiO2 90% PEEP 5 Sat 94% Goal to keep Sat >92% Reviewed last ABG 7.452/35.9/64/25 Primary Resp Alkalosis with chronic component and secondary metabolic alkalosis. Acid base can be explained by pt being on Lasix ggt causing contraction alkalosis in addition she is breathing over the vent.   Repeat ABG and CXR in AM. Continue on Diuresis VAP precautions Wean FiO2 to keep Sat > 92% and PO2 >60   ID: Empiric abx on board- for surgical prohylaxis Vancomycin and Cefuroxime -surgical prophylaxis Cultures from 03/28/17 were positive for E. Faecalis in the urine On this admission pt is MRSA negative she has a past h/o being positive in the past Continues on  Bactroban per CTS protocol Not on isolation Leukocytosis with no fever or evidence of active infection Limited utility for PCT in this setting esp post CTSx Trend WBC, and fever curve    Endocrine: H/o left Thyroid nodule Previously on Synthroid Continues on Synthroid rec'd 194mcg dose today H/o Vitamin D Deficiency and Osteoporosis Continue on insulin ggt for Goal BG 140-180mg /dl  GI: H/o GERD Pt is intubated Continue on PPI Aware that platelet count is slowly dropping.  PPI may also contribute to thrombocytopenia Feeding currently on Vital 1.5 at goal H/o Anusol suppositories due to hemorrhoids Continue on bowel softening regimen with bisacodyl  Heme: Active issues:  On Anticoagulation Anemia and Thrombocytopenia  Per last CBC  Hemoglobin 10 from 8.8 from 8.6-> 2 uPRBCs transfused Platelets are 63 Currently on Heparin ggt Last ACT 153 If Hgb< 8 transfuse PRBCs .Marland KitchenA POS DVT PPx->on Heparin ggt and SCDs in place  RENAL Primary Resp Alkalosis with chronic component and secondary metabolic alkalosis. On Lasix ggt  At Many 1240->716 continue to monitor strict Is and Os Lab Results  Component Value Date   CREATININE 0.92 07/12/2017    CREATININE 0.90 07/12/2017   CREATININE 0.94 07/12/2017  Electrolytes replace as needed Keep Mg 2, Phos 2, K+ 4     I, Dr Seward Carol have personally reviewed patient's available data, including medical history, events of note, physical examination and test results as part of my evaluation. I have discussed with PA and other care providers such as pharmacist, RN and Elink. The patient is critically ill with multiple organ systems failure and requires high complexity decision making for assessment and support, frequent evaluation and titration of therapies, application of advanced monitoring technologies and extensive interpretation of multiple databases. Critical Care Time devoted to patient care services described in this note is 45 Minutes. This time reflects time of care of this signee Dr Seward Carol. This critical care time does not reflect procedure time, or teaching time or supervisory time but could involve care discussion time    DISPOSITION: ICU- primary team HF and CTS  CC TIME: 45 MINS PROGNOSIS: GUARDED FAMILY: NOT AT BEDSIDE AT [place] CODE STATUS: FULL   Signed Dr Seward Carol Pulmonary Critical Care 07/12/2017, 8:03 PM

## 2017-07-12 NOTE — Progress Notes (Signed)
Initial Nutrition Assessment  INTERVENTION:   Paged MD Recommend  Vital AF 1.2 @ 45 ml/hr (1080 ml/day) 30 ml Prostat BID Provides: 1496 kcal, 111 grams protein, and 875 ml free water.    NUTRITION DIAGNOSIS:   Inadequate oral intake related to inability to eat as evidenced by NPO status.  GOAL:   Patient will meet greater than or equal to 90% of their needs  MONITOR:   Vent status, I & O's, TF tolerance  REASON FOR ASSESSMENT:   Ventilator, New TF    ASSESSMENT:   Pt with PMH of HTN, HLD, B12 deficiency admitted 1/14 for planned AVR and replacement of ascending aortic aneurysm. Pt developed acute cardiogenic shock and VF arrest. OR 1/15 for evac of hematoma and placement of RVAD/centromag.    Spoke with bedside RNs in room  1/15 Vital 1.5 started @ 20 ml/hr via OG tube 1/16 Cortrak tube placed post pyloric and Vital 1.5 increased to 50 ml/hr   Patient is currently intubated on ventilator support MV: 8.2 L/min Temp (24hrs), Avg:97.4 F (36.3 C), Min:96.3 F (35.7 C), Max:98.6 F (37 C) Admission weight 168 lb (76.2 kg)  Medications reviewed and include: dulcolax, colace, synthroid Levo and epinephrine drips Labs reviewed CBG's: 117-110  Chest tubes: 950 ml out x 24 hrs  NUTRITION - FOCUSED PHYSICAL EXAM:    Most Recent Value  Orbital Region  No depletion  Upper Arm Region  No depletion  Thoracic and Lumbar Region  No depletion  Buccal Region  No depletion  Temple Region  No depletion  Clavicle Bone Region  No depletion  Clavicle and Acromion Bone Region  No depletion  Scapular Bone Region  No depletion  Dorsal Hand  No depletion  Patellar Region  No depletion  Anterior Thigh Region  No depletion  Posterior Calf Region  No depletion  Edema (RD Assessment)  Mild  Hair  Reviewed  Eyes  Unable to assess  Mouth  Unable to assess  Skin  Reviewed  Nails  Reviewed       Diet Order:  Diet NPO time specified  EDUCATION NEEDS:   No education needs  have been identified at this time  Skin:  Skin Assessment: Reviewed RN Assessment  Last BM:  unknown  Height:   Ht Readings from Last 1 Encounters:  07/12/17 5\' 5"  (1.651 m)    Weight:   Wt Readings from Last 1 Encounters:  07/12/17 203 lb 7.8 oz (92.3 kg)    Ideal Body Weight:  56.8 kg  BMI:  Body mass index is 33.86 kg/m.  Estimated Nutritional Needs:   Kcal:  1433  Protein:  110-120 grams  Fluid:  > 1.5 L/day  Maylon Peppers RD, LDN, CNSC [phone] Pager [phone] After Hours Pager

## 2017-07-13 ENCOUNTER — Inpatient Hospital Stay (HOSPITAL_COMMUNITY): Payer: Medicare Other

## 2017-07-13 LAB — BPAM RBC
Blood Product Expiration Date: 201901172359
Blood Product Expiration Date: 201901182359
Blood Product Expiration Date: 201901282359
Blood Product Expiration Date: 201902012359
Blood Product Expiration Date: 201902012359
Blood Product Expiration Date: 201902022359
Blood Product Expiration Date: 201902022359
Blood Product Expiration Date: 201902022359
Blood Product Expiration Date: 201902032359
Blood Product Expiration Date: 201902042359
Blood Product Expiration Date: 201902042359
ISSUE DATE / TIME: 201901140724
ISSUE DATE / TIME: 201901140724
ISSUE DATE / TIME: 201901140724
ISSUE DATE / TIME: 201901141314
ISSUE DATE / TIME: 201901141545
ISSUE DATE / TIME: 201901141655
ISSUE DATE / TIME: 201901142057
ISSUE DATE / TIME: 201901150658
ISSUE DATE / TIME: 201901150658
ISSUE DATE / TIME: 201901151812
ISSUE DATE / TIME: 201901160759
Unit Type and Rh: 6200
Unit Type and Rh: 6200
Unit Type and Rh: 6200
Unit Type and Rh: 6200
Unit Type and Rh: 6200
Unit Type and Rh: 6200
Unit Type and Rh: 6200
Unit Type and Rh: 6200
Unit Type and Rh: 6200
Unit Type and Rh: 6200
Unit Type and Rh: 6200

## 2017-07-13 LAB — TYPE AND SCREEN
ABO/RH(D): A POS
Antibody Screen: NEGATIVE
Unit division: 0
Unit division: 0
Unit division: 0
Unit division: 0
Unit division: 0
Unit division: 0
Unit division: 0
Unit division: 0
Unit division: 0
Unit division: 0
Unit division: 0

## 2017-07-13 LAB — GLUCOSE, CAPILLARY
Glucose-Capillary: 102 mg/dL — ABNORMAL HIGH (ref 65–99)
Glucose-Capillary: 104 mg/dL — ABNORMAL HIGH (ref 65–99)
Glucose-Capillary: 108 mg/dL — ABNORMAL HIGH (ref 65–99)
Glucose-Capillary: 110 mg/dL — ABNORMAL HIGH (ref 65–99)
Glucose-Capillary: 111 mg/dL — ABNORMAL HIGH (ref 65–99)
Glucose-Capillary: 113 mg/dL — ABNORMAL HIGH (ref 65–99)
Glucose-Capillary: 114 mg/dL — ABNORMAL HIGH (ref 65–99)
Glucose-Capillary: 119 mg/dL — ABNORMAL HIGH (ref 65–99)
Glucose-Capillary: 119 mg/dL — ABNORMAL HIGH (ref 65–99)
Glucose-Capillary: 127 mg/dL — ABNORMAL HIGH (ref 65–99)
Glucose-Capillary: 132 mg/dL — ABNORMAL HIGH (ref 65–99)
Glucose-Capillary: 138 mg/dL — ABNORMAL HIGH (ref 65–99)
Glucose-Capillary: 145 mg/dL — ABNORMAL HIGH (ref 65–99)
Glucose-Capillary: 148 mg/dL — ABNORMAL HIGH (ref 65–99)
Glucose-Capillary: 156 mg/dL — ABNORMAL HIGH (ref 65–99)
Glucose-Capillary: 181 mg/dL — ABNORMAL HIGH (ref 65–99)

## 2017-07-13 LAB — POCT I-STAT, CHEM 8
BUN: 19 mg/dL (ref 6–20)
BUN: 17 mg/dL (ref 6–20)
BUN: 16 mg/dL (ref 6–20)
Calcium, Ion: 0.94 mmol/L — ABNORMAL LOW (ref 1.15–1.40)
Calcium, Ion: 0.98 mmol/L — ABNORMAL LOW (ref 1.15–1.40)
Calcium, Ion: 1.03 mmol/L — ABNORMAL LOW (ref 1.15–1.40)
Chloride: 97 mmol/L — ABNORMAL LOW (ref 101–111)
Chloride: 93 mmol/L — ABNORMAL LOW (ref 101–111)
Chloride: 92 mmol/L — ABNORMAL LOW (ref 101–111)
Creatinine, Ser: 0.8 mg/dL (ref 0.44–1.00)
Creatinine, Ser: 0.8 mg/dL (ref 0.44–1.00)
Creatinine, Ser: 0.8 mg/dL (ref 0.44–1.00)
Glucose, Bld: 113 mg/dL — ABNORMAL HIGH (ref 65–99)
Glucose, Bld: 184 mg/dL — ABNORMAL HIGH (ref 65–99)
Glucose, Bld: 138 mg/dL — ABNORMAL HIGH (ref 65–99)
HCT: 30 % — ABNORMAL LOW (ref 36.0–46.0)
HCT: 25 % — ABNORMAL LOW (ref 36.0–46.0)
HCT: 22 % — ABNORMAL LOW (ref 36.0–46.0)
Hemoglobin: 10.2 g/dL — ABNORMAL LOW (ref 12.0–15.0)
Hemoglobin: 8.5 g/dL — ABNORMAL LOW (ref 12.0–15.0)
Hemoglobin: 7.5 g/dL — ABNORMAL LOW (ref 12.0–15.0)
Potassium: 3.4 mmol/L — ABNORMAL LOW (ref 3.5–5.1)
Potassium: 3.4 mmol/L — ABNORMAL LOW (ref 3.5–5.1)
Potassium: 3.7 mmol/L (ref 3.5–5.1)
Sodium: 141 mmol/L (ref 135–145)
Sodium: 135 mmol/L (ref 135–145)
Sodium: 135 mmol/L (ref 135–145)
TCO2: 27 mmol/L (ref 22–32)
TCO2: 27 mmol/L (ref 22–32)
TCO2: 30 mmol/L (ref 22–32)

## 2017-07-13 LAB — POCT ACTIVATED CLOTTING TIME
Activated Clotting Time: 153 seconds
Activated Clotting Time: 153 seconds
Activated Clotting Time: 153 seconds
Activated Clotting Time: 158 seconds
Activated Clotting Time: 158 seconds
Activated Clotting Time: 158 seconds
Activated Clotting Time: 158 seconds
Activated Clotting Time: 158 seconds
Activated Clotting Time: 158 seconds
Activated Clotting Time: 158 seconds
Activated Clotting Time: 158 seconds
Activated Clotting Time: 158 seconds
Activated Clotting Time: 158 seconds
Activated Clotting Time: 158 seconds
Activated Clotting Time: 158 seconds
Activated Clotting Time: 158 seconds

## 2017-07-13 LAB — COMPREHENSIVE METABOLIC PANEL
ALT: 41 U/L (ref 14–54)
AST: 77 U/L — ABNORMAL HIGH (ref 15–41)
Albumin: 2.9 g/dL — ABNORMAL LOW (ref 3.5–5.0)
Alkaline Phosphatase: 50 U/L (ref 38–126)
Anion gap: 10 (ref 5–15)
BUN: 19 mg/dL (ref 6–20)
CO2: 26 mmol/L (ref 22–32)
Calcium: 6.9 mg/dL — ABNORMAL LOW (ref 8.9–10.3)
Chloride: 102 mmol/L (ref 101–111)
Creatinine, Ser: 0.88 mg/dL (ref 0.44–1.00)
GFR calc Af Amer: >60 mL/min (ref >60)
GFR calc non Af Amer: >60 mL/min (ref >60)
Glucose, Bld: 118 mg/dL — ABNORMAL HIGH (ref 65–99)
Potassium: 3.8 mmol/L (ref 3.5–5.1)
Sodium: 138 mmol/L (ref 135–145)
Total Bilirubin: 1.7 mg/dL — ABNORMAL HIGH (ref 0.3–1.2)
Total Protein: 4.5 g/dL — ABNORMAL LOW (ref 6.5–8.1)

## 2017-07-13 LAB — COOXEMETRY PANEL
Carboxyhemoglobin: 1.5 % (ref 0.5–1.5)
Carboxyhemoglobin: 1.4 % (ref 0.5–1.5)
Methemoglobin: 0.9 % (ref 0.0–1.5)
Methemoglobin: 1 % (ref 0.0–1.5)
O2 Saturation: 68.4 %
O2 Saturation: 70.3 %
Total hemoglobin: 8.7 g/dL — ABNORMAL LOW (ref 12.0–16.0)
Total hemoglobin: 9.1 g/dL — ABNORMAL LOW (ref 12.0–16.0)

## 2017-07-13 LAB — POCT I-STAT 3, ART BLOOD GAS (G3+)
Acid-Base Excess: 3 mmol/L — ABNORMAL HIGH (ref 0.0–2.0)
Bicarbonate: 26.5 mmol/L (ref 20.0–28.0)
O2 Saturation: 94 %
Patient temperature: 35.6
TCO2: 28 mmol/L (ref 22–32)
pCO2 arterial: 33.1 mmHg (ref 32.0–48.0)
pH, Arterial: 7.506 — ABNORMAL HIGH (ref 7.350–7.450)
pO2, Arterial: 59 mmHg — ABNORMAL LOW (ref 83.0–108.0)

## 2017-07-13 LAB — POCT I-STAT 4, (NA,K, GLUC, HGB,HCT)
Glucose, Bld: 149 mg/dL — ABNORMAL HIGH (ref 65–99)
HCT: 23 % — ABNORMAL LOW (ref 36.0–46.0)
Hemoglobin: 7.8 g/dL — ABNORMAL LOW (ref 12.0–15.0)
Potassium: 3.5 mmol/L (ref 3.5–5.1)
Sodium: 136 mmol/L (ref 135–145)

## 2017-07-13 LAB — CBC
HCT: 26.8 % — ABNORMAL LOW (ref 36.0–46.0)
Hemoglobin: 9.3 g/dL — ABNORMAL LOW (ref 12.0–15.0)
MCH: 28.4 pg (ref 26.0–34.0)
MCHC: 34.7 g/dL (ref 30.0–36.0)
MCV: 82 fL (ref 78.0–100.0)
Platelets: 56 10*3/uL — ABNORMAL LOW (ref 150–400)
RBC: 3.27 MIL/uL — ABNORMAL LOW (ref 3.87–5.11)
RDW: 15.8 % — ABNORMAL HIGH (ref 11.5–15.5)
WBC: 11.6 10*3/uL — ABNORMAL HIGH (ref 4.0–10.5)

## 2017-07-13 LAB — CALCIUM, IONIZED: Calcium, Ionized, Serum: 4.4 mg/dL — ABNORMAL LOW (ref 4.5–5.6)

## 2017-07-13 LAB — HEPARIN LEVEL (UNFRACTIONATED): Heparin Unfractionated: <0.1 IU/mL — ABNORMAL LOW (ref 0.30–0.70)

## 2017-07-13 LAB — APTT: aPTT: 69 seconds — ABNORMAL HIGH (ref 24–36)

## 2017-07-13 MED ORDER — SODIUM CHLORIDE 0.45 % IV SOLN
INTRAVENOUS | Status: DC | PRN
Start: 2017-07-13 — End: 2017-07-17
  Administered 2017-07-14: via INTRAVENOUS

## 2017-07-13 MED ORDER — POTASSIUM CHLORIDE 10 MEQ/50ML IV SOLN
10.0000 meq | INTRAVENOUS | Status: AC
  Administered 2017-07-13 (×2): 10 meq via INTRAVENOUS
  Filled 2017-07-13 (×2): qty 50

## 2017-07-13 MED ORDER — SODIUM CHLORIDE 0.9 % IV SOLN
0.5000 mg/kg/h | INTRAVENOUS | Status: DC
  Administered 2017-07-13 – 2017-07-16 (×4): 0.5 mg/kg/h via INTRAVENOUS
  Filled 2017-07-13 (×8): qty 100

## 2017-07-13 MED ORDER — INSULIN ASPART 100 UNIT/ML ~~LOC~~ SOLN
0.0000 [IU] | SUBCUTANEOUS | Status: DC
  Administered 2017-07-13: 2 [IU] via SUBCUTANEOUS
  Administered 2017-07-13: 4 [IU] via SUBCUTANEOUS
  Administered 2017-07-13 – 2017-07-14 (×6): 2 [IU] via SUBCUTANEOUS
  Administered 2017-07-15: 4 [IU] via SUBCUTANEOUS
  Administered 2017-07-15 (×2): 2 [IU] via SUBCUTANEOUS
  Administered 2017-07-16 (×2): 4 [IU] via SUBCUTANEOUS
  Administered 2017-07-16: 2 [IU] via SUBCUTANEOUS
  Administered 2017-07-16 (×2): 4 [IU] via SUBCUTANEOUS
  Administered 2017-07-16 – 2017-07-17 (×2): 2 [IU] via SUBCUTANEOUS

## 2017-07-13 MED ORDER — POTASSIUM CHLORIDE 10 MEQ/50ML IV SOLN
INTRAVENOUS | Status: AC
  Filled 2017-07-13: qty 150

## 2017-07-13 MED ORDER — POTASSIUM CHLORIDE 10 MEQ/50ML IV SOLN
INTRAVENOUS | Status: AC
  Administered 2017-07-13: 10 meq
  Filled 2017-07-13: qty 150

## 2017-07-13 MED ORDER — INSULIN DETEMIR 100 UNIT/ML ~~LOC~~ SOLN
12.0000 [IU] | Freq: Two times a day (BID) | SUBCUTANEOUS | Status: DC
  Administered 2017-07-13 – 2017-07-16 (×8): 12 [IU] via SUBCUTANEOUS
  Filled 2017-07-13 (×9): qty 0.12

## 2017-07-13 MED ORDER — POTASSIUM CHLORIDE 10 MEQ/50ML IV SOLN
10.0000 meq | INTRAVENOUS | Status: AC | PRN
  Administered 2017-07-13 – 2017-07-14 (×3): 10 meq via INTRAVENOUS
  Filled 2017-07-13 (×2): qty 50

## 2017-07-13 MED ORDER — DEXTROSE 5 % IV SOLN
1.0000 g | Freq: Three times a day (TID) | INTRAVENOUS | Status: DC
  Administered 2017-07-13 – 2017-07-17 (×13): 1 g via INTRAVENOUS
  Filled 2017-07-13 (×14): qty 1

## 2017-07-13 MED ORDER — SODIUM CHLORIDE 0.9 % IV SOLN: INTRAVENOUS | Status: DC | PRN

## 2017-07-13 MED ORDER — DEXMEDETOMIDINE HCL IN NACL 400 MCG/100ML IV SOLN
0.4000 ug/kg/h | INTRAVENOUS | Status: DC
  Administered 2017-07-13 – 2017-07-17 (×20): 1.2 ug/kg/h via INTRAVENOUS
  Administered 2017-07-17: 10:00:00 via INTRAVENOUS
  Administered 2017-07-17: 1.2 ug/kg/h via INTRAVENOUS
  Filled 2017-07-13 (×13): qty 100
  Filled 2017-07-13: qty 200
  Filled 2017-07-13 (×5): qty 100
  Filled 2017-07-13: qty 200

## 2017-07-13 MED ORDER — POTASSIUM CHLORIDE 10 MEQ/50ML IV SOLN
10.0000 meq | INTRAVENOUS | Status: AC
  Administered 2017-07-13 – 2017-07-14 (×3): 10 meq via INTRAVENOUS
  Filled 2017-07-13 (×3): qty 50

## 2017-07-13 MED ORDER — MILRINONE LACTATE IN DEXTROSE 20-5 MG/100ML-% IV SOLN
0.2500 ug/kg/min | INTRAVENOUS | Status: DC
  Administered 2017-07-13 – 2017-07-16 (×6): 0.25 ug/kg/min via INTRAVENOUS
  Filled 2017-07-13 (×6): qty 100

## 2017-07-13 MED ORDER — MAGNESIUM SULFATE 4 GM/100ML IV SOLN
4.0000 g | Freq: Once | INTRAVENOUS | Status: AC
  Administered 2017-07-13: 4 g via INTRAVENOUS
  Filled 2017-07-13: qty 100

## 2017-07-13 MED ORDER — POTASSIUM CHLORIDE 10 MEQ/50ML IV SOLN
10.0000 meq | INTRAVENOUS | Status: AC
  Administered 2017-07-13 (×3): 10 meq via INTRAVENOUS

## 2017-07-13 NOTE — Progress Notes (Signed)
Dr Cyndia Bent evening rounds. MD ordered 2 additional runs of potassium replacement for results 3.4. Total potassium given 8 runs for 7a-7p

## 2017-07-13 NOTE — Progress Notes (Signed)
Advanced Heart Failure Rounding Note  Primary Cardiologist: Dr. Percival Spanish (2014) HF: (New) Dr. Haroldine Laws   Subjective:    Events - 07/10/17 S/p AVR and ascending AA replacement  - 07/10/17 VF Arrest in evening with shock x 1 - 07/11/17 RVAD placement   Coox 68.4% this am Norepi 8, Epi 8, and milrinone 0.25.  CVP 13-14  Remains intubated and sedated. Plan to tentatively remain on RVAD through weekend.  VENT settings at 70% FiO2 and 5 PEEP.  CXR this am pending.   Made 3.7 L of UOP with 1 dose 40 mg IV lasix. On lasix gtt at 8/hr.  MAPs 60-70s  RVAD - 2600 rpm, 3.4-3.5 L of flow.  Echo 06/02/2017 LVEF 55-60%, Grade 1 DD, Mild/Mod AI with LVOT, Mid ascending aortic diameter 48.88 mm, Mild LAE, RV normal  Objective:   Weight Range: 204 lb 2.3 oz (92.6 kg) Body mass index is 33.97 kg/m.   Vital Signs:   Temp:  [96.1 F (35.6 C)-98.6 F (37 C)] 97.5 F (36.4 C) (01/17 0700) Pulse Rate:  [85-98] 91 (01/17 0700) Resp:  [11-22] 11 (01/17 0400) BP: (77-116)/(51-72) 95/57 (01/17 0700) SpO2:  [91 %-97 %] 97 % (01/17 0700) Arterial Line BP: (77-136)/(39-62) 111/54 (01/17 0700) FiO2 (%):  [70 %-100 %] 70 % (01/17 0325) Weight:  [204 lb 2.3 oz (92.6 kg)] 204 lb 2.3 oz (92.6 kg) (01/17 0426) Last BM Date: (PTA)  Weight change: Filed Weights   07/10/17 0545 07/12/17 0500 07/13/17 0426  Weight: 168 lb (76.2 kg) 203 lb 7.8 oz (92.3 kg) 204 lb 2.3 oz (92.6 kg)    Intake/Output:   Intake/Output Summary (Last 24 hours) at 07/13/2017 0726 Last data filed at 07/13/2017 0700 Gross per 24 hour  Intake 4540.1 ml  Output 5061 ml  Net -520.9 ml    Physical Exam    General: Intubated/sedated.  HEENT: + ETT Neck: Supple. JVP to jaw. Carotids 2+ bilat; no bruits. No thyromegaly or nodule noted. Cor: PMI nondisplaced. RRR, No M/G/R noted. +RVAD sounds. Chest dressing C/D/I. Inflow cannula of RVAD stable.  Lungs: Diminished basilar sounds. + Mechanical breathing sounds.  Abdomen:  Soft, non-tender, non-distended, no HSM. No bruits or masses. +BS  Extremities: No cyanosis, clubbing, or rash. 1-2+ BLE edema. R groin outflow cannula stable. No chatter in lines.  Neuro: Intubated/sedated   Telemetry   NSR 90s, personally reviewed.   EKG    No new tracings.  Labs    CBC Recent Labs    07/12/17 1700 07/13/17 0405  WBC 13.9* 11.6*  HGB 10.0* 9.3*  HCT 28.6* 26.8*  MCV 81.5 82.0  PLT 63* 56*   Basic Metabolic Panel Recent Labs    07/12/17 0323 07/12/17 1606 07/12/17 1700 07/13/17 0405  NA 140 139  --  138  K 3.7 3.6  --  3.8  CL 106 99*  --  102  CO2 23  --   --  26  GLUCOSE 145* 103*  --  118*  BUN 18 20  --  19  CREATININE 0.94 0.90 0.92 0.88  CALCIUM 7.6*  --   --  6.9*  MG 2.1  --  1.7  --    Liver Function Tests Recent Labs    07/12/17 0323 07/13/17 0405  AST 110* 77*  ALT 50 41  ALKPHOS 40 50  BILITOT 1.3* 1.7*  PROT 3.9* 4.5*  ALBUMIN 2.9* 2.9*   No results for input(s): LIPASE, AMYLASE in the last 72  hours. Cardiac Enzymes Recent Labs    07/10/17 2011  CKTOTAL 396*    BNP: BNP (last 3 results) No results for input(s): BNP in the last 8760 hours.  ProBNP (last 3 results) No results for input(s): PROBNP in the last 8760 hours.   D-Dimer Recent Labs    07/11/17 0944  DDIMER 1.06*   Hemoglobin A1C No results for input(s): HGBA1C in the last 72 hours. Fasting Lipid Panel No results for input(s): CHOL, HDL, LDLCALC, TRIG, CHOLHDL, LDLDIRECT in the last 72 hours. Thyroid Function Tests No results for input(s): TSH, T4TOTAL, T3FREE, THYROIDAB in the last 72 hours.  Invalid input(s): FREET3  Other results:   Imaging    Dg Chest Port 1 View  Result Date: 07/12/2017 CLINICAL DATA:  Hypoxia EXAM: PORTABLE CHEST 1 VIEW COMPARISON:  July 11, 2017 FINDINGS: Endotracheal tube tip is 2.8 cm above the carina. Swan-Ganz catheter tip is in the main pulmonary outflow tract. Left subclavian catheter tip is in the  superior vena cava. There is a mediastinal drain. Nasogastric tube tip and side port are in the stomach. There are coil containing catheter devices positioned in the superior vena cava and descending thoracic aortic regions. There is a chest tube on the right. No evident pneumothorax. There is atelectatic change in lung bases with small left pleural effusion. There is also mild atelectatic change in the right upper lobe. Heart is upper normal in size with pulmonary vascularity within normal limits. Patient is status post aortic valve replacement. Postoperative changes noted in the left proximal humerus. There are also surgical clips consistent with thyroidectomy. IMPRESSION: Tube and catheter positions as described without pneumothorax. Bibasilar atelectatic change, more severe on the left than on the right. Small left pleural effusion. Also atelectatic change right upper lobe. Stable cardiac silhouette. Electronically Signed   By: Lowella Grip III M.D.   On: 07/12/2017 09:19   Dg Abd Portable 1v  Result Date: 07/12/2017 CLINICAL DATA:  Encounter for feeding tube placement EXAM: PORTABLE ABDOMEN - 1 VIEW COMPARISON:  Portable exam 7056 hours compared to earlier study of 1037 hours FINDINGS: Feeding tube coiled in proximal stomach with tip projecting over pylorus or duodenal bulb. Tip of nasogastric tube projects over proximal stomach. Tip of Swan-Ganz catheter projects over main pulmonary artery near bifurcation. Epicardial pacing leads and aortic valve prosthesis noted. Additional vascular lines project over lower chest and abdomen. Bones demineralized. Atelectasis versus infiltrate at LEFT base. IMPRESSION: Tip of feeding tube projects over expected position of the pylorus or duodenal bulb. Electronically Signed   By: Lavonia Dana M.D.   On: 07/12/2017 18:24   Dg Abd Portable 1v  Result Date: 07/12/2017 CLINICAL DATA:  Feeding tube placement. EXAM: PORTABLE ABDOMEN - 1 VIEW COMPARISON:  One-view chest  x-ray 07/11/2016. FINDINGS: Tip of a small bore feeding tube is at the level of the duodenum. There is some redundancy in the fundus of the stomach. Vascular catheters are stable. IMPRESSION: The tip of a small bore feeding tube is at the duodenum. Electronically Signed   By: San Morelle M.D.   On: 07/12/2017 10:59    Medications:    Scheduled Medications: . acetaminophen  1,000 mg Oral Q6H   Or  . acetaminophen (TYLENOL) oral liquid 160 mg/5 mL  1,000 mg Per Tube Q6H  . aspirin EC  325 mg Oral Daily   Or  . aspirin  324 mg Per Tube Daily  . bisacodyl  10 mg Oral Daily   Or  .  bisacodyl  10 mg Rectal Daily  . chlorhexidine gluconate (MEDLINE KIT)  15 mL Mouth Rinse BID  . Chlorhexidine Gluconate Cloth  6 each Topical Daily  . docusate sodium  200 mg Oral Daily  . fentaNYL (SUBLIMAZE) injection  50 mcg Intravenous Once  . insulin regular  0-10 Units Intravenous TID WC  . latanoprost  1 drop Right Eye QHS  . levothyroxine  100 mcg Oral QODAY  . levothyroxine  112 mcg Oral QODAY  . mouth rinse  15 mL Mouth Rinse 10 times per day  . metoCLOPramide (REGLAN) injection  10 mg Intravenous Q6H  . metoprolol tartrate  12.5 mg Oral BID   Or  . metoprolol tartrate  12.5 mg Per Tube BID  . mupirocin ointment  1 application Topical BID  . pantoprazole (PROTONIX) IV  40 mg Intravenous Q24H  . sodium chloride flush  3 mL Intravenous Q12H  . sodium chloride flush  3 mL Intravenous Q12H  . venlafaxine XR  75 mg Oral Daily    Infusions: . sodium chloride 10 mL/hr at 07/13/17 0700  . sodium chloride Stopped (07/12/17 1113)  . sodium chloride Stopped (07/12/17 0849)  . amiodarone 30 mg/hr (07/13/17 0700)  . cefUROXime (ZINACEF)  IV Stopped (07/13/17 0231)  . dexmedetomidine 1.2 mcg/kg/hr (07/13/17 0700)  . EPINEPHrine 4 mg in dextrose 5% 250 mL infusion (16 mcg/mL) 8 mcg/min (07/13/17 0700)  . feeding supplement (VITAL 1.5 CAL) 1,000 mL (07/13/17 0700)  . fentaNYL 50 mcg/hr (07/13/17  0700)  . furosemide (LASIX) infusion 8 mg/hr (07/13/17 0700)  . heparin 850 Units/hr (07/13/17 0700)  . insulin (NOVOLIN-R) infusion 2.1 Units/hr (07/13/17 0700)  . lactated ringers    . lactated ringers 10 mL/hr at 07/13/17 0700  . lactated ringers Stopped (07/12/17 0849)  . midazolam (VERSED) infusion 2 mg/hr (07/13/17 0700)  . milrinone 0.25 mcg/kg/min (07/13/17 0700)  . nitroGLYCERIN Stopped (07/11/17 1046)  . norepinephrine (LEVOPHED) Adult infusion 8 mcg/min (07/13/17 0700)  . vancomycin Stopped (07/12/17 2200)  . vasopressin (PITRESSIN) infusion - *FOR SHOCK*      PRN Medications: sodium chloride, bisacodyl, docusate, fentaNYL, lactated ringers, metoprolol tartrate, midazolam, morphine injection, ondansetron (ZOFRAN) IV, oxyCODONE, sodium chloride flush, sodium chloride flush, traMADol, vasopressin (PITRESSIN) infusion - *FOR SHOCK*    Patient Profile   [name] is a 77 y.o. female with history of fusiform ascending aortic aneurysm, HTN, and HLD.   Admitted 07/10/17 for planned AVR and replacement of ascending aortic aneurysm. Developed acute cardiogenic shock + VF arrest. Taken back to OR am of 07/11/17 for evacuation of hematoma and placement of RVAD.   Assessment/Plan   1. Cardiogenic shock s/p cardiac surgery with R>L CHF. - Pre-op Echo 06/02/2017 LVEF 55-60%, Grade 1 DD, Mild/Mod AI with LVOT, Mid ascending aortic diameter 48.88 mm, Mild LAE, RV normal - Tenuous but stabilized with RVAD in place - RVAD settings 2600 and flow of ~3.4-3.5 - Vent settings FiO2 70% and PEEP of 5 - Great response to 40 mg IV lasix x 1 yesterday. Will discuss with MD.  2. VF arrest - Stable. No further arrythmia.  - Goal K > 4.0 and Mg > 2.0. Supp aggressively. K 3.8 this am and Mg 1.7 yesterday.  Will give 4 g Mg this am.  3. Atrial fibrillation - Remains in NSR on IV amiodarone.  - Follow electrolytes 4. Acute respiratory failure - Intubated and sedated.  - Vent settings as  above.  5. S/p AVR and Ascending aortic  aneurysm replacement.  - Per surgery. No change.  6. AKI - Stable on current support.  7. Hypokalemia - 3.8 this am. Continue to follow and supplement as needed.   Medication concerns reviewed with patient and pharmacy team. Barriers identified: None at this time.   Length of Stay: 3  Annamaria Helling  07/13/2017, 7:26 AM  Advanced Heart Failure Team Pager [phone] (M-F; 7a - 4p)  Please contact [facility] for night-coverage after hours (4p -7a ) and weekends on [email or web address]  Agree with above.   She remains critically ill. Remains on vent. Sedated. On triple pressors. Has responded well to IV diuresis. CXR much improved (Personally reviewed). Will continue lasix gtt. RVAD at 2600 with 3.5L output. Site looks good. Remains in NSR on amio   On exam.  Critically ill. Intubated Cor RRR + RVAD hum  PA cannula ok Lungs decreased at bases Ab obese soft NT Ext warm muld edema. RFV cannula ok.   Stabilized with multiple pressors and RVAD support. Oxygenation now improving with lasix gtt. Will continue. Can wean NE slowly as tolerated. Watch closely for infection. RVAD explant tentatively planned for Monday.   CRITICAL CARE Performed by: Glori Bickers  Total critical care time: 35 minutes  Critical care time was exclusive of separately billable procedures and treating other patients.  Critical care was necessary to treat or prevent imminent or life-threatening deterioration.  Critical care was time spent personally by me (independent of midlevel providers or residents) on the following activities: development of treatment plan with patient and/or surrogate as well as nursing, discussions with consultants, evaluation of patient's response to treatment, examination of patient, obtaining history from patient or surrogate, ordering and performing treatments and interventions, ordering and review of laboratory studies, ordering and  review of radiographic studies, pulse oximetry and re-evaluation of patient's condition.  Glori Bickers, MD  9:13 AM

## 2017-07-13 NOTE — Progress Notes (Signed)
Patient ID: [name], female   DOB: [birth year], 77 y.o.   MRN: [id number] [facility] Evening Rounds:  Hemodynamically stable on 0.25 Milrinone, 8 epi, 9.5 levophed.  CVP 10-11. RVAD flow stable at 3.5 Co-ox 70  Excellent diuresis on lasix 8. Replacing K+  Remains sedated on vent.  BMET    Component Value Date/Time   NA 135 07/13/2017 1549   NA 143 03/15/2017 0920   K 3.4 (L) 07/13/2017 1549   CL 93 (L) 07/13/2017 1549   CO2 26 07/13/2017 0405   GLUCOSE 184 (H) 07/13/2017 1549   BUN 17 07/13/2017 1549   BUN 12 03/15/2017 0920   CREATININE 0.80 07/13/2017 1549   CREATININE 0.65 10/25/2012 0826   CALCIUM 6.9 (L) 07/13/2017 0405   GFRNONAA >60 07/13/2017 0405   GFRNONAA >89 10/25/2012 0826   GFRAA >60 07/13/2017 0405   GFRAA >89 10/25/2012 6004

## 2017-07-13 NOTE — Plan of Care (Signed)
  Progressing Cardiac: Hemodynamic stability will improve 07/13/2017 2248 - Progressing by Alonna Buckler, RN Clinical Measurements: Postoperative complications will be avoided or minimized 07/13/2017 2248 - Progressing by Alonna Buckler, RN Respiratory: Respiratory status will improve 07/13/2017 2248 - Progressing by Alonna Buckler, RN Skin Integrity: Wound healing without signs and symptoms of infection 07/13/2017 2248 - Progressing by Alonna Buckler, RN Risk for impaired skin integrity will decrease 07/13/2017 2248 - Progressing by Alonna Buckler, RN Urinary Elimination: Ability to achieve and maintain adequate renal perfusion and functioning will improve 07/13/2017 2248 - Progressing by Alonna Buckler, RN

## 2017-07-13 NOTE — Progress Notes (Signed)
Dr. [name] called for update on patient. Reviewed all labs and gtts, including rct ACT and PTT. Heparin gtt infusing at 900u/hr, MD made no changes. Per MD ok to recheck PTT with AM labs.

## 2017-07-13 NOTE — Progress Notes (Signed)
Patient Chem 8 results of potassium are 3.4. RN replacing with  3 runs via KCL protocol.

## 2017-07-13 NOTE — Consult Note (Signed)
.. ..  Name: [name] MRN: [id number] DOB: 27-Feb-1941    ADMISSION DATE:  07/10/2017  CONSULTATION DATE:  07/12/17  REFERRING MD :  Nils Pyle MD  CHIEF COMPLAINT:   BRIEF PATIENT DESCRIPTION: SIGNIFICANT EVENTS  77 yr old female POD 1 TEE, Open Heart with evacuation of Hematoma and placement of RVAD.  STUDIES:  TEE  BRIEF:   77 year old female with past medical history significant for fusiform ascending aortic aneurysm, hypertension, hyperlipidemia, status post AVR and replacement of ascending aortic aneurysm.  On 07/10/2017 postprocedure patient developed acute cardiogenic shock had an episode of V. fib arrest was taken back to the OR on 07/11/2017 for evacuation of hematoma and placement of RVAD.  PCCM consulted for ventilator management.   EVENTS 07/10/2017 - admit 07/12/17 - pccm consult   SUBJECTIVE/OVERNIGHT/INTERVAL HX 07/13/2017 - levophed, epi gtt, milrinone, precedex, fent gtt, versed gt heparin gtt and mag infusion with 70% fio2. RN says precedex is max because of breakthrough agitation     VITAL SIGNS: Temp:  [96.1 F (35.6 C)-98.6 F (37 C)] 97.5 F (36.4 C) (01/17 0800) Pulse Rate:  [85-98] 89 (01/17 0800) Resp:  [11-22] 11 (01/17 0400) BP: (77-116)/(51-72) 97/57 (01/17 0800) SpO2:  [91 %-98 %] 98 % (01/17 0800) Arterial Line BP: (77-136)/(39-62) 105/46 (01/17 0800) FiO2 (%):  [70 %-100 %] 70 % (01/17 0325) Weight:  [92.6 kg (204 lb 2.3 oz)] 92.6 kg (204 lb 2.3 oz) (01/17 0426)  PHYSICAL EXAMINATION:  General Appearance:    Looks criticall ill OBESE - yes  Head:    Normocephalic, without obvious abnormality, atraumatic  Eyes:    PERRL - yes, conjunctiva/corneas - clerar      Ears:    Normal external ear canals, both ears  Nose:   Throat:  ETT TUBE - yes , OG tube - yes  Neck:   Supple,  No enlargement/tenderness/nodules     Lungs:     Clear to auscultation bilaterally, Ventilator   Synchrony - yes  Chest wall:    No deformity  Heart:    S1 and S2  normal, no murmur, CVP - na.  Pressors - multiple  Abdomen:     Soft, no masses, no organomegaly  Genitalia:    Not done  Rectal:   not done  Extremities:   Extremities- intact     Skin:   Intact in exposed areas . Sacral area - not examined     Neurologic:   Sedation - fent gtt, versed gtt, precedex gtt -> RASS - -3 .      PULMONARY Recent Labs  Lab 07/12/17 0346 07/12/17 0530  07/12/17 1206 07/12/17 1340 07/12/17 1606 07/12/17 1621 07/12/17 1824 07/13/17 0420  PHART 7.454* 7.434  --  7.431  --   --  7.441 7.452*  --   PCO2ART 38.7 37.3  --  37.3  --   --  36.7 35.9  --   PO2ART 77.0* 62.0*  --  57.0*  --   --  63.0* 64.0*  --   HCO3 27.3 25.2  --  24.9  --   --  25.0 25.1  --   TCO2 29 26  --  26  --  25 26 26   --   O2SAT 96.0 93.0   < > 91.0 67.9  --  93.0 93.0 68.4   < > = values in this interval not displayed.    CBC Recent Labs  Lab 07/12/17 0323  07/12/17 1606 07/12/17 1700  07/13/17 0405  HGB 8.6*   < > 8.8* 10.0* 9.3*  HCT 24.2*   < > 26.0* 28.6* 26.8*  WBC 12.5*  --   --  13.9* 11.6*  PLT 79*  --   --  63* 56*   < > = values in this interval not displayed.    COAGULATION Recent Labs  Lab 07/10/17 1500 07/11/17 0944 07/11/17 1037 07/11/17 1259 07/11/17 1552  INR 1.67 1.76 1.73 1.85 1.71    CARDIAC  No results for input(s): TROPONINI in the last 168 hours. No results for input(s): PROBNP in the last 168 hours.   CHEMISTRY Recent Labs  Lab 07/06/17 1539  07/11/17 0259  07/11/17 0944 07/11/17 0954 07/11/17 1135 07/11/17 1552 07/11/17 1604 07/12/17 0323 07/12/17 1606 07/12/17 1700 07/13/17 0405  NA 138   < > 142   < > 143 147*  --   --  143 140 139  --  138  K 3.7   < > 3.5   < > 2.8* 2.8*  --   --  3.8 3.7 3.6  --  3.8  CL 101   < > 109   < > 107  --   --   --  102 106 99*  --  102  CO2 23  --  18*  --  23  --   --   --   --  23  --   --  26  GLUCOSE 104*   < > 179*   < > 126* 130*  --   --  130* 145* 103*  --  118*  BUN 11   < >  12   < > 13  --   --   --  16 18 20   --  19  CREATININE 0.56   < > 1.00   < > 0.96  --   --   --  0.80 0.94 0.90 0.92 0.88  CALCIUM 8.5*  --  7.6*  --  8.3*  --   --   --   --  7.6*  --   --  6.9*  MG  --    < > 2.3  --   --   --  1.6* 2.6*  --  2.1  --  1.7  --    < > = values in this interval not displayed.   Estimated Creatinine Clearance: 62.1 mL/min (by C-G formula based on SCr of 0.88 mg/dL).   LIVER Recent Labs  Lab 07/06/17 1539 07/10/17 1500 07/11/17 0944 07/11/17 1037 07/11/17 1259 07/11/17 1552 07/12/17 0323 07/13/17 0405  AST 25  --  151* 150*  --   --  110* 77*  ALT 13*  --  69* 70*  --   --  50 41  ALKPHOS 92  --  46 46  --   --  40 50  BILITOT 0.9  --  2.0* 2.0*  --   --  1.3* 1.7*  PROT 6.8  --  4.2* 4.2*  --   --  3.9* 4.5*  ALBUMIN 4.3  --  3.1* 3.1*  --   --  2.9* 2.9*  INR 1.03 1.67 1.76 1.73 1.85 1.71  --   --      INFECTIOUS No results for input(s): LATICACIDVEN, PROCALCITON in the last 168 hours.   ENDOCRINE CBG (last 3)  Recent Labs    07/13/17 0457 07/13/17 0607 07/13/17 0657  GLUCAP 111* 104* 108*  IMAGING x48h  - image(s) personally visualized  -   highlighted in bold Dg Chest Port 1 View  Result Date: 07/12/2017 CLINICAL DATA:  Hypoxia EXAM: PORTABLE CHEST 1 VIEW COMPARISON:  July 11, 2017 FINDINGS: Endotracheal tube tip is 2.8 cm above the carina. Swan-Ganz catheter tip is in the main pulmonary outflow tract. Left subclavian catheter tip is in the superior vena cava. There is a mediastinal drain. Nasogastric tube tip and side port are in the stomach. There are coil containing catheter devices positioned in the superior vena cava and descending thoracic aortic regions. There is a chest tube on the right. No evident pneumothorax. There is atelectatic change in lung bases with small left pleural effusion. There is also mild atelectatic change in the right upper lobe. Heart is upper normal in size with pulmonary vascularity within  normal limits. Patient is status post aortic valve replacement. Postoperative changes noted in the left proximal humerus. There are also surgical clips consistent with thyroidectomy. IMPRESSION: Tube and catheter positions as described without pneumothorax. Bibasilar atelectatic change, more severe on the left than on the right. Small left pleural effusion. Also atelectatic change right upper lobe. Stable cardiac silhouette. Electronically Signed   By: Lowella Grip III M.D.   On: 07/12/2017 09:19   Dg Chest Portable 1 View  Result Date: 07/11/2017 CLINICAL DATA:  Postop for line placement. EXAM: PORTABLE CHEST 1 VIEW COMPARISON:  Radiograph of same day. FINDINGS: Stable cardiomegaly. Interval placement of right-sided chest tube without pneumothorax. Endoscopy device is noted, presumably in mid esophagus. Right internal jugular Swan-Ganz catheter is noted with distal tip in expected position of main pulmonary artery. Status post aortic valve repair. Minimal left basilar subsegmental atelectasis is noted. Bony thorax is unremarkable. Endotracheal tube is in grossly good position. Nasogastric tube appears to have been removed. Interval placement of left subclavian catheter with distal tip in expected position of SVC. IMPRESSION: Right-sided chest tube is noted without pneumothorax. Interval placement of left subclavian catheter with distal tip in expected position of the SVC. Endoscopy device appears to be projected over expected position of mid esophagus. Endotracheal tube and right internal jugular Swan-Ganz catheter in good position. Electronically Signed   By: Marijo Conception, M.D.   On: 07/11/2017 09:30   Dg Abd Portable 1v  Result Date: 07/12/2017 CLINICAL DATA:  Encounter for feeding tube placement EXAM: PORTABLE ABDOMEN - 1 VIEW COMPARISON:  Portable exam 7056 hours compared to earlier study of 1037 hours FINDINGS: Feeding tube coiled in proximal stomach with tip projecting over pylorus or duodenal  bulb. Tip of nasogastric tube projects over proximal stomach. Tip of Swan-Ganz catheter projects over main pulmonary artery near bifurcation. Epicardial pacing leads and aortic valve prosthesis noted. Additional vascular lines project over lower chest and abdomen. Bones demineralized. Atelectasis versus infiltrate at LEFT base. IMPRESSION: Tip of feeding tube projects over expected position of the pylorus or duodenal bulb. Electronically Signed   By: Lavonia Dana M.D.   On: 07/12/2017 18:24   Dg Abd Portable 1v  Result Date: 07/12/2017 CLINICAL DATA:  Feeding tube placement. EXAM: PORTABLE ABDOMEN - 1 VIEW COMPARISON:  One-view chest x-ray 07/11/2016. FINDINGS: Tip of a small bore feeding tube is at the level of the duodenum. There is some redundancy in the fundus of the stomach. Vascular catheters are stable. IMPRESSION: The tip of a small bore feeding tube is at the duodenum. Electronically Signed   By: San Morelle M.D.   On: 07/12/2017 10:59  ASSESSMENT / PLAN: 77 year old female with PMHx significant for fusiform ascending aortic aneurysm, hypertension, hyperlipidemia, status post AVR and replacement of ascending aortic aneurysm.  On 07/10/2017 postprocedure patient developed acute cardiogenic shock had an episode of V. fib arrest was taken back to the OR on 07/11/2017 for evacuation of hematoma and placement of RVAD.   ASSESSMENT / PLAN:  PULMONARY A: #current: acute resp failure   07/13/2017 -> acute resp faiure - does not meet sbt criteria  P:   Full vent support  CARDIOVASCULAR A:   #current circulatyr cardiogenic shock  07/13/2017 -> multiple pressors and ionotrpes and amio  P:  Per cVTS ? Can come off precedex to help with negative ionotropy  RENAL  Intake/Output Summary (Last 24 hours) at 07/13/2017 0829 Last data filed at 07/13/2017 0800 Gross per 24 hour  Intake 4390.2 ml  Output 5019 ml  Net -628.8 ml   Recent Labs  Lab 07/11/17 1604  07/12/17 0323 07/12/17 1606 07/12/17 1700 07/13/17 0405  CREATININE 0.80 0.94 0.90 0.92 0.88     A:   07/13/2017 -> low calciuum  P:   Replete calcium  GASTROINTESTINAL A:   On TF. Mil transaminitis  P:   Continue TF Hold tylenol   HEMATOLOGIC Recent Labs  Lab 07/12/17 0323  07/12/17 1606 07/12/17 1700 07/13/17 0405  HGB 8.6*   < > 8.8* 10.0* 9.3*  HCT 24.2*   < > 26.0* 28.6* 26.8*  WBC 12.5*  --   --  13.9* 11.6*  PLT 79*  --   --  63* 56*   < > = values in this interval not displayed.    A:   #RBC: anemia of criticla illness #Platelet thrombocytopenia - noted on IV heparin but proifile does not fitt HITT #WBC improving leukocytosis  P:  - PRBC for hgb </= 6.9gm%    - exceptions are   -  if ACS susepcted/confirmed then transfuse for hgb </= 8.0gm%,  or    -  active bleeding with hemodynamic instability, then transfuse regardless of hemoglobin value   At at all times try to transfuse 1 unit prbc as possible with exception of active hemorrhage  Heparin per CVTS   INFECTIOUS No results for input(s): PROCALCITON in the last 168 hours.  Results for orders placed or performed during the hospital encounter of 07/06/17  Surgical pcr screen     Status: None   Collection Time: 07/06/17  4:03 PM  Result Value Ref Range Status   MRSA, PCR NEGATIVE NEGATIVE Final   Staphylococcus aureus NEGATIVE NEGATIVE Final    Comment: (NOTE) The Xpert SA Assay (FDA approved for NASAL specimens in patients 12 years of age and older), is one component of a comprehensive surveillance program. It is not intended to diagnose infection nor to guide or monitor treatment.     A:   On empiric antibiotics P:   Anti-infectives (From admission, onward)   Start     Dose/Rate Route Frequency Ordered Stop   07/13/17 0815  cefTAZidime (FORTAZ) 1 g in dextrose 5 % 50 mL IVPB     1 g 100 mL/hr over 30 Minutes Intravenous Every 8 hours 07/13/17 0802     07/11/17 2000  vancomycin  (VANCOCIN) 1,250 mg in sodium chloride 0.9 % 250 mL IVPB     1,250 mg 166.7 mL/hr over 90 Minutes Intravenous Every 24 hours 07/11/17 1123     07/11/17 1815  vancomycin (VANCOCIN) IVPB 1000 mg/200 mL premix  Status:  Discontinued     1,000 mg 200 mL/hr over 60 Minutes Intravenous  Once 07/11/17 1037 07/11/17 1123   07/11/17 1415  cefUROXime (ZINACEF) 1.5 g in dextrose 5 % 50 mL IVPB  Status:  Discontinued     1.5 g 100 mL/hr over 30 Minutes Intravenous Every 12 hours 07/11/17 1037 07/11/17 1216   07/11/17 1400  cefUROXime (ZINACEF) 1.5 g in dextrose 5 % 50 mL IVPB  Status:  Discontinued     1.5 g 100 mL/hr over 30 Minutes Intravenous Every 12 hours 07/11/17 1050 07/13/17 0803   07/10/17 2100  cefUROXime (ZINACEF) 1.5 g in dextrose 5 % 50 mL IVPB  Status:  Discontinued     1.5 g 100 mL/hr over 30 Minutes Intravenous Every 12 hours 07/10/17 1440 07/11/17 1216   07/10/17 2030  vancomycin (VANCOCIN) IVPB 1000 mg/200 mL premix     1,000 mg 200 mL/hr over 60 Minutes Intravenous  Once 07/10/17 1440 07/10/17 2313   07/10/17 0400  vancomycin (VANCOCIN) 1,250 mg in sodium chloride 0.9 % 250 mL IVPB     1,250 mg 166.7 mL/hr over 90 Minutes Intravenous To Surgery 07/09/17 1324 07/10/17 1514   07/10/17 0400  cefUROXime (ZINACEF) 1.5 g in dextrose 5 % 50 mL IVPB     1.5 g 100 mL/hr over 30 Minutes Intravenous To Surgery 07/09/17 1324 07/10/17 1515   07/10/17 0400  cefUROXime (ZINACEF) 750 mg in dextrose 5 % 50 mL IVPB  Status:  Discontinued     750 mg 100 mL/hr over 30 Minutes Intravenous To Surgery 07/09/17 1324 07/10/17 1440       ENDOCRINE A:   At risk hyperglucemia P:   ICU hyperglycemia protocol  NEUROLOGIC A:     07/13/2017 -> RASS -4 on sedation gtt P:   RASS goal: -3 with RVAD Dc prn oxyucodone, morphine and tylneol Fent gtt Versed gtt ? Dc precedex gtt due to negative effects of BP/HR (CVTS opinion needed)(   FAMILY  - Updates: 07/13/2017 --> no family at bedside  -  Inter-disciplinary family meet or Palliative Care meeting due by:  DAy 7. Current LOS is LOS 3 days   DISPO Keep in ICU    The patient is critically ill with multiple organ systems failure and requires high complexity decision making for assessment and support, frequent evaluation and titration of therapies, application of advanced monitoring technologies and extensive interpretation of multiple databases.   Critical Care Time devoted to patient care services described in this note is  30  Minutes. This time reflects time of care of this signee Dr Brand Males. This critical care time does not reflect procedure time, or teaching time or supervisory time of PA/NP/Med student/Med Resident etc but could involve care discussion time    Dr. Brand Males, M.D., [facility].C.P Pulmonary and Critical Care Medicine Staff Physician [facility] Pager: [phone], If no answer or between  15:00h - 7:00h: call [phone]  07/13/2017 8:29 AM

## 2017-07-14 ENCOUNTER — Inpatient Hospital Stay (HOSPITAL_COMMUNITY): Payer: Medicare Other

## 2017-07-14 DIAGNOSIS — J9600 Acute respiratory failure, unspecified whether with hypoxia or hypercapnia: Secondary | ICD-10-CM

## 2017-07-14 LAB — PHOSPHORUS: Phosphorus: 4.1 mg/dL (ref 2.5–4.6)

## 2017-07-14 LAB — POCT I-STAT 3, ART BLOOD GAS (G3+)
Acid-Base Excess: 5 mmol/L — ABNORMAL HIGH (ref 0.0–2.0)
Acid-Base Excess: 9 mmol/L — ABNORMAL HIGH (ref 0.0–2.0)
Bicarbonate: 29.8 mmol/L — ABNORMAL HIGH (ref 20.0–28.0)
Bicarbonate: 32.3 mmol/L — ABNORMAL HIGH (ref 20.0–28.0)
O2 Saturation: 93 %
O2 Saturation: 95 %
Patient temperature: 36.6
Patient temperature: 36.6
TCO2: 31 mmol/L (ref 22–32)
TCO2: 33 mmol/L — ABNORMAL HIGH (ref 22–32)
pCO2 arterial: 41.9 mmHg (ref 32.0–48.0)
pCO2 arterial: 39.8 mmHg (ref 32.0–48.0)
pH, Arterial: 7.458 — ABNORMAL HIGH (ref 7.350–7.450)
pH, Arterial: 7.516 — ABNORMAL HIGH (ref 7.350–7.450)
pO2, Arterial: 63 mmHg — ABNORMAL LOW (ref 83.0–108.0)
pO2, Arterial: 64 mmHg — ABNORMAL LOW (ref 83.0–108.0)

## 2017-07-14 LAB — CBC WITH DIFFERENTIAL/PLATELET
Basophils Absolute: 0 10*3/uL (ref 0.0–0.1)
Basophils Relative: 0 %
EOS ABS: 0.1 10*3/uL (ref 0.0–0.7)
Eosinophils Relative: 1 %
HEMATOCRIT: 26.3 % — AB (ref 36.0–46.0)
Hemoglobin: 8.8 g/dL — ABNORMAL LOW (ref 12.0–15.0)
LYMPHS ABS: 1.8 10*3/uL (ref 0.7–4.0)
Lymphocytes Relative: 17 %
MCH: 28.2 pg (ref 26.0–34.0)
MCHC: 33.5 g/dL (ref 30.0–36.0)
MCV: 84.3 fL (ref 78.0–100.0)
MONOS PCT: 9 %
Monocytes Absolute: 0.9 10*3/uL (ref 0.1–1.0)
NEUTROS ABS: 7.4 10*3/uL (ref 1.7–7.7)
Neutrophils Relative %: 73 %
Platelets: 91 10*3/uL — ABNORMAL LOW (ref 150–400)
RBC: 3.12 MIL/uL — ABNORMAL LOW (ref 3.87–5.11)
RDW: 16.2 % — ABNORMAL HIGH (ref 11.5–15.5)
WBC: 10.1 10*3/uL (ref 4.0–10.5)

## 2017-07-14 LAB — COMPREHENSIVE METABOLIC PANEL
ALT: 32 U/L (ref 14–54)
AST: 49 U/L — ABNORMAL HIGH (ref 15–41)
Albumin: 2.6 g/dL — ABNORMAL LOW (ref 3.5–5.0)
Alkaline Phosphatase: 61 U/L (ref 38–126)
Anion gap: 11 (ref 5–15)
BUN: 14 mg/dL (ref 6–20)
CO2: 28 mmol/L (ref 22–32)
Calcium: 7.8 mg/dL — ABNORMAL LOW (ref 8.9–10.3)
Chloride: 96 mmol/L — ABNORMAL LOW (ref 101–111)
Creatinine, Ser: 0.75 mg/dL (ref 0.44–1.00)
GFR calc Af Amer: >60 mL/min (ref >60)
GFR calc non Af Amer: >60 mL/min (ref >60)
Glucose, Bld: 178 mg/dL — ABNORMAL HIGH (ref 65–99)
Potassium: 3.8 mmol/L (ref 3.5–5.1)
Sodium: 135 mmol/L (ref 135–145)
Total Bilirubin: 1 mg/dL (ref 0.3–1.2)
Total Protein: 4.7 g/dL — ABNORMAL LOW (ref 6.5–8.1)

## 2017-07-14 LAB — POCT I-STAT, CHEM 8
BUN: 15 mg/dL (ref 6–20)
Calcium, Ion: 1.14 mmol/L — ABNORMAL LOW (ref 1.15–1.40)
Chloride: 87 mmol/L — ABNORMAL LOW (ref 101–111)
Creatinine, Ser: 0.7 mg/dL (ref 0.44–1.00)
Glucose, Bld: 117 mg/dL — ABNORMAL HIGH (ref 65–99)
HCT: 25 % — ABNORMAL LOW (ref 36.0–46.0)
Hemoglobin: 8.5 g/dL — ABNORMAL LOW (ref 12.0–15.0)
Potassium: 3.7 mmol/L (ref 3.5–5.1)
Sodium: 133 mmol/L — ABNORMAL LOW (ref 135–145)
TCO2: 34 mmol/L — ABNORMAL HIGH (ref 22–32)

## 2017-07-14 LAB — BASIC METABOLIC PANEL
Anion gap: 12 (ref 5–15)
BUN: 15 mg/dL (ref 6–20)
CALCIUM: 7.8 mg/dL — AB (ref 8.9–10.3)
CHLORIDE: 97 mmol/L — AB (ref 101–111)
CO2: 27 mmol/L (ref 22–32)
CREATININE: 0.78 mg/dL (ref 0.44–1.00)
GFR calc non Af Amer: >60 mL/min (ref >60)
Glucose, Bld: 156 mg/dL — ABNORMAL HIGH (ref 65–99)
Potassium: 3.9 mmol/L (ref 3.5–5.1)
Sodium: 136 mmol/L (ref 135–145)

## 2017-07-14 LAB — PREPARE PLATELET PHERESIS: Unit division: 0

## 2017-07-14 LAB — BPAM PLATELET PHERESIS
Blood Product Expiration Date: 201901182359
ISSUE DATE / TIME: 201901170830
Unit Type and Rh: 6200

## 2017-07-14 LAB — COOXEMETRY PANEL
Carboxyhemoglobin: 0.9 % (ref 0.5–1.5)
Carboxyhemoglobin: 1.3 % (ref 0.5–1.5)
Methemoglobin: 1.3 % (ref 0.0–1.5)
Methemoglobin: 1.4 % (ref 0.0–1.5)
O2 Saturation: 65.4 %
O2 Saturation: 71.6 %
Total hemoglobin: 8.6 g/dL — ABNORMAL LOW (ref 12.0–16.0)
Total hemoglobin: 8.8 g/dL — ABNORMAL LOW (ref 12.0–16.0)

## 2017-07-14 LAB — GLUCOSE, CAPILLARY
Glucose-Capillary: 139 mg/dL — ABNORMAL HIGH (ref 65–99)
Glucose-Capillary: 142 mg/dL — ABNORMAL HIGH (ref 65–99)
Glucose-Capillary: 150 mg/dL — ABNORMAL HIGH (ref 65–99)
Glucose-Capillary: 104 mg/dL — ABNORMAL HIGH (ref 65–99)
Glucose-Capillary: 106 mg/dL — ABNORMAL HIGH (ref 65–99)
Glucose-Capillary: 126 mg/dL — ABNORMAL HIGH (ref 65–99)

## 2017-07-14 LAB — HEPARIN LEVEL (UNFRACTIONATED): Heparin Unfractionated: <0.1 IU/mL — ABNORMAL LOW (ref 0.30–0.70)

## 2017-07-14 LAB — POCT ACTIVATED CLOTTING TIME
Activated Clotting Time: 158 seconds
Activated Clotting Time: 164 seconds
Activated Clotting Time: 158 seconds

## 2017-07-14 LAB — LACTIC ACID, PLASMA: Lactic Acid, Venous: 1.6 mmol/L (ref 0.5–1.9)

## 2017-07-14 LAB — CALCIUM, IONIZED: Calcium, Ionized, Serum: 3.9 mg/dL — ABNORMAL LOW (ref 4.5–5.6)

## 2017-07-14 LAB — APTT
aPTT: 69 seconds — ABNORMAL HIGH (ref 24–36)
aPTT: 65 seconds — ABNORMAL HIGH (ref 24–36)

## 2017-07-14 LAB — MAGNESIUM: Magnesium: 1.5 mg/dL — ABNORMAL LOW (ref 1.7–2.4)

## 2017-07-14 LAB — PREPARE RBC (CROSSMATCH)

## 2017-07-14 MED ORDER — POTASSIUM CHLORIDE 10 MEQ/50ML IV SOLN
10.0000 meq | INTRAVENOUS | Status: AC
  Administered 2017-07-14 (×3): 10 meq via INTRAVENOUS

## 2017-07-14 MED ORDER — POTASSIUM CHLORIDE 10 MEQ/50ML IV SOLN: 10.0000 meq | INTRAVENOUS | Status: DC | PRN

## 2017-07-14 MED ORDER — CHLORHEXIDINE GLUCONATE CLOTH 2 % EX PADS
6.0000 | MEDICATED_PAD | Freq: Every day | CUTANEOUS | Status: DC
  Administered 2017-07-14 – 2017-07-17 (×4): 6 via TOPICAL

## 2017-07-14 MED ORDER — POTASSIUM CHLORIDE 10 MEQ/50ML IV SOLN
10.0000 meq | INTRAVENOUS | Status: AC
  Administered 2017-07-14 (×2): 10 meq via INTRAVENOUS

## 2017-07-14 MED ORDER — FUROSEMIDE 10 MG/ML IJ SOLN
4.0000 mg/h | INTRAVENOUS | Status: DC
  Administered 2017-07-14: 4 mg/h via INTRAVENOUS
  Filled 2017-07-14: qty 25

## 2017-07-14 MED ORDER — MAGNESIUM SULFATE 4 GM/100ML IV SOLN
4.0000 g | Freq: Once | INTRAVENOUS | Status: AC
  Administered 2017-07-14: 4 g via INTRAVENOUS
  Filled 2017-07-14: qty 100

## 2017-07-14 MED ORDER — POLYETHYLENE GLYCOL 3350 17 G PO PACK
17.0000 g | PACK | ORAL | Status: DC | PRN
  Administered 2017-07-15: 17 g via ORAL
  Filled 2017-07-14: qty 1

## 2017-07-14 MED FILL — Mannitol IV Soln 20%: INTRAVENOUS | Qty: 500 | Status: AC

## 2017-07-14 MED FILL — Sodium Chloride IV Soln 0.9%: INTRAVENOUS | Qty: 4000 | Status: AC

## 2017-07-14 MED FILL — Heparin Sodium (Porcine) Inj 1000 Unit/ML: INTRAMUSCULAR | Qty: 30 | Status: AC

## 2017-07-14 MED FILL — Sodium Chloride IV Soln 0.9%: INTRAVENOUS | Qty: 2000 | Status: AC

## 2017-07-14 MED FILL — Lidocaine HCl IV Inj 20 MG/ML: INTRAVENOUS | Qty: 5 | Status: AC

## 2017-07-14 MED FILL — Calcium Chloride Inj 10%: INTRAVENOUS | Qty: 10 | Status: AC

## 2017-07-14 MED FILL — Electrolyte-R (PH 7.4) Solution: INTRAVENOUS | Qty: 4000 | Status: AC

## 2017-07-14 MED FILL — Heparin Sodium (Porcine) Inj 1000 Unit/ML: INTRAMUSCULAR | Qty: 10 | Status: AC

## 2017-07-14 MED FILL — Sodium Bicarbonate IV Soln 8.4%: INTRAVENOUS | Qty: 100 | Status: AC

## 2017-07-14 NOTE — Anesthesia Postprocedure Evaluation (Signed)
Anesthesia Post Note  Patient: [name]  Procedure(s) Performed: TRANSESOPHAGEAL ECHOCARDIOGRAM (TEE) EXPLORATION POST OPERATIVE OPEN HEART (N/A Chest) PLACEMENT OF CENTRIMAG VENTRICULAR ASSIST DEVICE (Right ) EVACUATION HEMATOMA (N/A )     Patient location during evaluation: SICU Anesthesia Type: General Level of consciousness: patient cooperative and patient remains intubated per anesthesia plan Pain management: pain level controlled Vital Signs Assessment: post-procedure vital signs reviewed and stable Respiratory status: patient remains intubated per anesthesia plan and patient on ventilator - see flowsheet for VS Anesthetic complications: no    Last Vitals:  Vitals:   07/14/17 2043 07/14/17 2100  BP:  (!) 108/51  Pulse: 96 89  Resp: 15 15  Temp: 36.7 C 36.6 C  SpO2: 98% 97%    Last Pain:  Vitals:   07/14/17 2000  TempSrc: Core  PainSc:                  Marek Nghiem COKER

## 2017-07-14 NOTE — Progress Notes (Signed)
.. ..  Name: [name] MRN: [id number] DOB: 11-19-1940    ADMISSION DATE:  07/10/2017  CONSULTATION DATE:  07/12/17  REFERRING MD :  Nils Pyle MD  CHIEF COMPLAINT:   BRIEF PATIENT DESCRIPTION: SIGNIFICANT EVENTS  77 yr old female POD 1 TEE, Open Heart with evacuation of Hematoma and placement of RVAD.  STUDIES: Echo12/12/2016 LVEF 55-60%, Grade 1 DD, Mild/Mod AI with LVOT, Mid ascending aortic diameter 48.88 mm, Mild LAE, RV normal    TEE  BRIEF:   77 year old female with past medical history significant for fusiform ascending aortic aneurysm, hypertension, hyperlipidemia, status post AVR and replacement of ascending aortic aneurysm.  On 07/10/2017 postprocedure patient developed acute cardiogenic shock had an episode of V. fib arrest was taken back to the OR on 07/11/2017 for evacuation of hematoma and placement of RVAD.  PCCM consulted for ventilator management.   Events - 07/10/17 Admit - 07/10/17 S/p AVR and ascending AA replacement  - 07/10/17 VF Arrest in evening with shock x 1 - 07/11/17 to OR for evacuation of hematoma and RVAD placement  - 07/12/17 PCCM consult      SUBJECTIVE/OVERNIGHT/INTERVAL HX 07/14/2017 - levophed, epi gtt, milrinone, precedex, fent gtt, versed gt heparin gtt and mag infusion , decreased FIO2 to 50%. RN says precedex is max because of breakthrough agitation, 100 Fentanyl, 2 versed     VITAL SIGNS: Temp:  [96.8 F (36 C)-98.6 F (37 C)] 98.1 F (36.7 C) (01/18 0900) Pulse Rate:  [64-95] 92 (01/18 0900) Resp:  [14-19] 16 (01/18 0900) BP: (91-126)/(46-68) 105/58 (01/18 0900) SpO2:  [95 %-100 %] 97 % (01/18 0900) Arterial Line BP: (103-151)/(45-59) 128/50 (01/18 0900) FiO2 (%):  [50 %-70 %] 50 % (01/18 0838) Weight:  [197 lb 1.5 oz (89.4 kg)] 197 lb 1.5 oz (89.4 kg) (01/18 0200)  PHYSICAL EXAMINATION:  General Appearance:    Looks criticall ill OBESE, sedated - yes  Head:    Normocephalic, without obvious abnormality, atraumatic  Eyes:     PERRL - yes, conjunctiva/corneas - clerar      Ears:    Normal external ear canals, both ears  Nose:   Throat:  ETT TUBE - yes , OG tube - yes  Neck:   Supple,  No enlargement/tenderness/nodules, + JVD     Lungs:     Clear to auscultation bilaterally, Ventilator , Crackles per bases  Synchrony - yes  Chest wall:    RVAD lines intact, dressing clean and dry  Heart:    S1 and S2 normal,RVAD sounds noted,  no murmur, CVP - na.  Pressors - multiple  Abdomen:     Soft, no masses, no organomegaly  Genitalia:    Not done  Rectal:   not done  Extremities:   Extremities- intact with peticiae     Skin:   Intact in exposed areas, no mottling . Sacral area - not examined     Neurologic:   Sedation - fent gtt, versed gtt, precedex gtt -> RASS - -3 .      PULMONARY Recent Labs  Lab 07/12/17 1206  07/12/17 1621 07/12/17 1824 07/13/17 0005 07/13/17 0420 07/13/17 0432 07/13/17 1549 07/13/17 1705 07/13/17 2154 07/14/17 0228 07/14/17 0230  PHART 7.431  --  7.441 7.452*  --   --  7.506*  --   --   --  7.458*  --   PCO2ART 37.3  --  36.7 35.9  --   --  33.1  --   --   --  41.9  --   PO2ART 57.0*  --  63.0* 64.0*  --   --  59.0*  --   --   --  63.0*  --   HCO3 24.9  --  25.0 25.1  --   --  26.5  --   --   --  29.8*  --   TCO2 26   < > 26 26 27   --  28 27  --  30 31  --   O2SAT 91.0   < > 93.0 93.0  --  68.4 94.0  --  70.3  --  93.0 65.4   < > = values in this interval not displayed.    CBC Recent Labs  Lab 07/12/17 1700  07/13/17 0405  07/13/17 1549 07/13/17 2154 07/14/17 0217  HGB 10.0*   < > 9.3*   < > 8.5* 7.5* 8.8*  HCT 28.6*   < > 26.8*   < > 25.0* 22.0* 26.3*  WBC 13.9*  --  11.6*  --   --   --  10.1  PLT 63*  --  56*  --   --   --  91*   < > = values in this interval not displayed.    COAGULATION Recent Labs  Lab 07/10/17 1500 07/11/17 0944 07/11/17 1037 07/11/17 1259 07/11/17 1552  INR 1.67 1.76 1.73 1.85 1.71    CARDIAC  No results for input(s): TROPONINI  in the last 168 hours. No results for input(s): PROBNP in the last 168 hours.   CHEMISTRY Recent Labs  Lab 07/11/17 0944  07/11/17 1135 07/11/17 1552  07/12/17 0323  07/12/17 1700  07/13/17 0405 07/13/17 1434 07/13/17 1549 07/13/17 2154 07/13/17 2331 07/14/17 0217  NA 143   < >  --   --    < > 140   < >  --    < > 138 136 135 135 136 135  K 2.8*   < >  --   --    < > 3.7   < >  --    < > 3.8 3.5 3.4* 3.7 3.9 3.8  CL 107  --   --   --    < > 106   < >  --    < > 102  --  93* 92* 97* 96*  CO2 23  --   --   --   --  23  --   --   --  26  --   --   --  27 28  GLUCOSE 126*   < >  --   --    < > 145*   < >  --    < > 118* 149* 184* 138* 156* 178*  BUN 13  --   --   --    < > 18   < >  --    < > 19  --  17 16 15 14   CREATININE 0.96  --   --   --    < > 0.94   < > 0.92   < > 0.88  --  0.80 0.80 0.78 0.75  CALCIUM 8.3*  --   --   --   --  7.6*  --   --   --  6.9*  --   --   --  7.8* 7.8*  MG  --   --  1.6* 2.6*  --  2.1  --  1.7  --   --   --   --   --   --  1.5*  PHOS  --   --   --   --   --   --   --   --   --   --   --   --   --   --  4.1   < > = values in this interval not displayed.   Estimated Creatinine Clearance: 67.1 mL/min (by C-G formula based on SCr of 0.75 mg/dL).   LIVER Recent Labs  Lab 07/10/17 1500 07/11/17 0944 07/11/17 1037 07/11/17 1259 07/11/17 1552 07/12/17 0323 07/13/17 0405 07/14/17 0217  AST  --  151* 150*  --   --  110* 77* 49*  ALT  --  69* 70*  --   --  50 41 32  ALKPHOS  --  46 46  --   --  40 50 61  BILITOT  --  2.0* 2.0*  --   --  1.3* 1.7* 1.0  PROT  --  4.2* 4.2*  --   --  3.9* 4.5* 4.7*  ALBUMIN  --  3.1* 3.1*  --   --  2.9* 2.9* 2.6*  INR 1.67 1.76 1.73 1.85 1.71  --   --   --      INFECTIOUS Recent Labs  Lab 07/13/17 2331  LATICACIDVEN 1.6     ENDOCRINE CBG (last 3)  Recent Labs    07/13/17 2338 07/14/17 0309 07/14/17 0734  GLUCAP 145* 139* 142*         IMAGING x48h  - image(s) personally visualized  -    highlighted in bold Dg Chest Port 1 View  Result Date: 07/14/2017 CLINICAL DATA:  Right ventricular assistance device. EXAM: PORTABLE CHEST 1 VIEW COMPARISON:  Radiograph of July 13, 2017. FINDINGS: Stable cardiomegaly. Endotracheal and nasogastric tubes are unchanged in position. Right-sided chest tube is unchanged without pneumothorax. Right internal jugular Swan-Ganz catheter is noted with tip in expected position of main pulmonary artery. Status post aortic valve replacement. Ventricular assist device components are unchanged. Right lung is clear. Stable left left lung opacity is noted concerning for infiltrate or atelectasis with probable mild left pleural effusion. Postsurgical changes are seen in the left shoulder. IMPRESSION: Stable support apparatus. Stable left lung opacity concerning for infiltrate or atelectasis with probable mild left pleural effusion. Electronically Signed   By: Marijo Conception, M.D.   On: 07/14/2017 09:20   Dg Chest Port 1 View  Result Date: 07/13/2017 CLINICAL DATA:  RV AD. EXAM: PORTABLE CHEST 1 VIEW COMPARISON:  07/12/2017 FINDINGS: External pacer overlies the chest. Endotracheal tube tip is 3 cm above the carina. Nasogastric tube enters the abdomen. Swan-Ganz catheter inserted from a right jugular approach has its tip in the main pulmonary artery. Previous aortic valve replacement. Ventricular assist device components are unchanged. Right chest tube is in place with no pneumothorax. The right lung is clear. There is left lower lobe atelectasis. No pulmonary edema. IMPRESSION: Lines and tubes unchanged. No pneumothorax. Ventricular assist components appear the same radiographically. Continued left lower lobe collapse. Electronically Signed   By: Nelson Chimes M.D.   On: 07/13/2017 09:31   Dg Abd Portable 1v  Result Date: 07/13/2017 CLINICAL DATA:  Ventilator support. Right ventricular assist device. EXAM: PORTABLE ABDOMEN - 1 VIEW COMPARISON:  07/12/2017 FINDINGS:  Nasogastric tube tip is in the stomach. Soft feeding tube tip extends to the antrum. Ventricular assist components appear the same. Bowel gas pattern remains normal. IMPRESSION: No change.  No unexpected finding. Electronically Signed  By: Nelson Chimes M.D.   On: 07/13/2017 09:32   Dg Abd Portable 1v  Result Date: 07/12/2017 CLINICAL DATA:  Encounter for feeding tube placement EXAM: PORTABLE ABDOMEN - 1 VIEW COMPARISON:  Portable exam 7056 hours compared to earlier study of 1037 hours FINDINGS: Feeding tube coiled in proximal stomach with tip projecting over pylorus or duodenal bulb. Tip of nasogastric tube projects over proximal stomach. Tip of Swan-Ganz catheter projects over main pulmonary artery near bifurcation. Epicardial pacing leads and aortic valve prosthesis noted. Additional vascular lines project over lower chest and abdomen. Bones demineralized. Atelectasis versus infiltrate at LEFT base. IMPRESSION: Tip of feeding tube projects over expected position of the pylorus or duodenal bulb. Electronically Signed   By: Lavonia Dana M.D.   On: 07/12/2017 18:24   Dg Abd Portable 1v  Result Date: 07/12/2017 CLINICAL DATA:  Feeding tube placement. EXAM: PORTABLE ABDOMEN - 1 VIEW COMPARISON:  One-view chest x-ray 07/11/2016. FINDINGS: Tip of a small bore feeding tube is at the level of the duodenum. There is some redundancy in the fundus of the stomach. Vascular catheters are stable. IMPRESSION: The tip of a small bore feeding tube is at the duodenum. Electronically Signed   By: San Morelle M.D.   On: 07/12/2017 10:59        ASSESSMENT / PLAN: 77 year old female with PMHx significant for fusiform ascending aortic aneurysm, hypertension, hyperlipidemia, status post AVR and replacement of ascending aortic aneurysm.  On 07/10/2017 postprocedure patient developed acute cardiogenic shock had an episode of V. fib arrest was taken back to the OR on 07/11/2017 for evacuation of hematoma and placement  of RVAD.   ASSESSMENT / PLAN:  PULMONARY A: #current: acute resp failure   07/14/2017 -> acute resp faiure - does not meet sbt criteria CXR 1/18>>Stable left lung opacity concerning for infiltrate or atelectasis with probable mild left pleural effusion P:   Full vent support Wean FIO2 as able Sat goals are > 94% CXR daily ABG prn  CARDIOVASCULAR A:  Atrial fibrillation per tele #current circulatyr cardiogenic shock 1/18>>Coox 65.4%  on Norepi 8, Epi 8, and Milrinone 0.25. CVP 8-9 07/14/2017 -> multiple pressors and ionotrpes and amio RVAD Settings - 2600 rpm, 3.4-3.6 L of flow.>> tubing secure, CDI -Weight still up from pre admission > 15 pounds - 8.3 L UO 1/17 Goal of  Mag > 2 and K > 4 - 1500 cc last 24 - Net positive 31 L  P:  Continue Amio Per CVTS Lasix gtt per cards>> decreased to 4 mg/Hr 1/18 ? Can come off precedex to help with negative ionotropy Mag and K repletion per cards 1/18 No CPR for now  2/2 open chest  RENAL  Intake/Output Summary (Last 24 hours) at 07/14/2017 0932 Last data filed at 07/14/2017 0900 Gross per 24 hour  Intake 7242.06 ml  Output 9240 ml  Net -1997.94 ml   Recent Labs  Lab 07/13/17 0405 07/13/17 1549 07/13/17 2154 07/13/17 2331 07/14/17 0217  CREATININE 0.88 0.80 0.80 0.78 0.75     A:   07/14/2017 -> low calciuum Good UO Lasix gtt decreased to 4 mg/ hr per cards Mag goal > 2, K goal > 4  P:  Replete calcium Replete mag and K per cards Maintain renal perfusion Avoid nephrotoxic medications Monitor UO  GASTROINTESTINAL A:   On TF. Mil transaminitis  P:   Continue TF Hold tylenol   HEMATOLOGIC Recent Labs  Lab 07/12/17 1700  07/13/17 0405  07/13/17 1549  07/13/17 2154 07/14/17 0217  HGB 10.0*   < > 9.3*   < > 8.5* 7.5* 8.8*  HCT 28.6*   < > 26.8*   < > 25.0* 22.0* 26.3*  WBC 13.9*  --  11.6*  --   --   --  10.1  PLT 63*  --  56*  --   --   --  91*   < > = values in this interval not displayed.     A:   #RBC: anemia of criticla illness #Platelet thrombocytopenia - noted on IV heparin but proifile does not fitt HITT #WBC improving leukocytosis  P:  - PRBC for hgb </= 6.9gm%    - exceptions are   -  if ACS susepcted/confirmed then transfuse for hgb </= 8.0gm%,  or    -  active bleeding with hemodynamic instability, then transfuse regardless of hemoglobin value   At at all times try to transfuse 1 unit prbc as possible with exception of active hemorrhage CBC daily Monitor for active bleeding Heparin per CVTS   INFECTIOUS No results for input(s): PROCALCITON in the last 168 hours.  Results for orders placed or performed during the hospital encounter of 07/10/17  Culture, respiratory (NON-Expectorated)     Status: None (Preliminary result)   Collection Time: 07/13/17  8:14 AM  Result Value Ref Range Status   Specimen Description TRACHEAL ASPIRATE  Final   Special Requests Normal  Final   Gram Stain   Final    FEW WBC PRESENT, PREDOMINANTLY PMN RARE GRAM NEGATIVE DIPLOCOCCI    Culture CULTURE REINCUBATED FOR BETTER GROWTH  Final   Report Status PENDING  Incomplete    A:   On empiric antibiotics 1/18 CXR with? infiltrates P:   Anti-infectives (From admission, onward)   Start     Dose/Rate Route Frequency Ordered Stop   07/13/17 0815  cefTAZidime (FORTAZ) 1 g in dextrose 5 % 50 mL IVPB     1 g 100 mL/hr over 30 Minutes Intravenous Every 8 hours 07/13/17 0802     07/11/17 2000  vancomycin (VANCOCIN) 1,250 mg in sodium chloride 0.9 % 250 mL IVPB     1,250 mg 166.7 mL/hr over 90 Minutes Intravenous Every 24 hours 07/11/17 1123     07/11/17 1815  vancomycin (VANCOCIN) IVPB 1000 mg/200 mL premix  Status:  Discontinued     1,000 mg 200 mL/hr over 60 Minutes Intravenous  Once 07/11/17 1037 07/11/17 1123   07/11/17 1415  cefUROXime (ZINACEF) 1.5 g in dextrose 5 % 50 mL IVPB  Status:  Discontinued     1.5 g 100 mL/hr over 30 Minutes Intravenous Every 12 hours 07/11/17  1037 07/11/17 1216   07/11/17 1400  cefUROXime (ZINACEF) 1.5 g in dextrose 5 % 50 mL IVPB  Status:  Discontinued     1.5 g 100 mL/hr over 30 Minutes Intravenous Every 12 hours 07/11/17 1050 07/13/17 0803   07/10/17 2100  cefUROXime (ZINACEF) 1.5 g in dextrose 5 % 50 mL IVPB  Status:  Discontinued     1.5 g 100 mL/hr over 30 Minutes Intravenous Every 12 hours 07/10/17 1440 07/11/17 1216   07/10/17 2030  vancomycin (VANCOCIN) IVPB 1000 mg/200 mL premix     1,000 mg 200 mL/hr over 60 Minutes Intravenous  Once 07/10/17 1440 07/10/17 2313   07/10/17 0400  vancomycin (VANCOCIN) 1,250 mg in sodium chloride 0.9 % 250 mL IVPB     1,250 mg 166.7 mL/hr over 90 Minutes Intravenous To  Surgery 07/09/17 1324 07/10/17 1514   07/10/17 0400  cefUROXime (ZINACEF) 1.5 g in dextrose 5 % 50 mL IVPB     1.5 g 100 mL/hr over 30 Minutes Intravenous To Surgery 07/09/17 1324 07/10/17 1515   07/10/17 0400  cefUROXime (ZINACEF) 750 mg in dextrose 5 % 50 mL IVPB  Status:  Discontinued     750 mg 100 mL/hr over 30 Minutes Intravenous To Surgery 07/09/17 1324 07/10/17 1440     Follow WBC and fever curve Trend Micro Consider PCT to guide ABX treatment Re-culture as is clinically indicated  ENDOCRINE A:   At risk hyperglycemia P:   ICU hyperglycemia protocol CBG Q 4  NEUROLOGIC A:   Sedated RASS Goal -4  07/14/2017 -> RASS -4 on sedation gtt Remains maxed on Precedex, 100 Fentanyl, 2 versed P:   RASS goal: -3 with RVAD Dc prn oxyucodone, morphine and tylneol Fent gtt Versed gtt ? Dc precedex gtt due to negative effects of BP/HR (CVTS opinion needed)   FAMILY  - Updates: 07/14/2017 --> no family at bedside  - Inter-disciplinary family meet or Palliative Care meeting due by:  DAy 7. Current LOS is LOS 4 days   DISPO ICU on life support  Magdalen Spatz, AGACNP-BC [facility] Pager:  [phone]  07/14/2017 9:32 AM

## 2017-07-14 NOTE — Progress Notes (Addendum)
Advanced Heart Failure Rounding Note  Primary Cardiologist: Dr. Percival Spanish (2014) HF: (New) Dr. Haroldine Laws   Subjective:    Events - 07/10/17 S/p AVR and ascending AA replacement  - 07/10/17 VF Arrest in evening with shock x 1 - 07/11/17 RVAD placement   Lasix stopped yesterday due to low CVP. Continues to diurese. Weight down another 6 pound  Remains intubated and sedated but will follow commands when awake.   Coox 67.4% this am on Norepi 4, Epi 8, and Milrinone 0.25. CVP 5. MAPs 70-80s  RVAD 2600 RPM with 3.5L flow   Remains intubated and sedated. FiO2 increased to 50% this am. CXR with improving edema. +atx (Personally reviewed)  PA 29/14 (good pulsativity)  RVAD - 2600 rpm, 3.4-3.6 L of flow.  Echo 06/02/2017 LVEF 55-60%, Grade 1 DD, Mild/Mod AI with LVOT, Mid ascending aortic diameter 48.88 mm, Mild LAE, RV normal  Objective:   Weight Range: 197 lb 1.5 oz (89.4 kg) Body mass index is 32.8 kg/m.   Vital Signs:   Temp:  [96.8 F (36 C)-98.6 F (37 C)] 97.9 F (36.6 C) (01/18 0700) Pulse Rate:  [64-94] 92 (01/18 0700) Resp:  [14-19] 15 (01/18 0700) BP: (86-126)/(46-68) 117/58 (01/18 0700) SpO2:  [95 %-99 %] 97 % (01/18 0700) Arterial Line BP: (95-151)/(45-59) 135/55 (01/18 0700) FiO2 (%):  [50 %-70 %] 50 % (01/18 0400) Weight:  [197 lb 1.5 oz (89.4 kg)] 197 lb 1.5 oz (89.4 kg) (01/18 0200) Last BM Date: (PTA)  Weight change: Filed Weights   07/12/17 0500 07/13/17 0426 07/14/17 0200  Weight: 203 lb 7.8 oz (92.3 kg) 204 lb 2.3 oz (92.6 kg) 197 lb 1.5 oz (89.4 kg)    Intake/Output:   Intake/Output Summary (Last 24 hours) at 07/14/2017 0740 Last data filed at 07/14/2017 0700 Gross per 24 hour  Intake 7127.38 ml  Output 8715 ml  Net -1587.62 ml    Physical Exam    General:  Intubated sedate No resp difficulty HEENT: normal Neck: supple. RIJ swan no jvd. Carotids 2+ bilat; no bruits. No lymphadenopathy or thryomegaly appreciated. Cor: PMI nondisplaced.  Regular rate & rhythm. RVAD hum Sternal dressing ok. +CTs and PA cannula sites ok Lungs: clear Abdomen: obese soft, nontender, nondistended. No hepatosplenomegaly. No bruits or masses. Good bowel sounds. Extremities: no cyanosis, clubbing, rash, edema  RFV cannula site ok  Neuro: alert & orientedx3, cranial nerves grossly intact. moves all 4 extremities w/o difficulty. Affect pleasant   Telemetry   NSR 80-90s, personally reviewed.   EKG    No new tracings.    Labs    CBC Recent Labs    07/13/17 0405  07/13/17 2154 07/14/17 0217  WBC 11.6*  --   --  10.1  NEUTROABS  --   --   --  7.4  HGB 9.3*   < > 7.5* 8.8*  HCT 26.8*   < > 22.0* 26.3*  MCV 82.0  --   --  84.3  PLT 56*  --   --  91*   < > = values in this interval not displayed.   Basic Metabolic Panel Recent Labs    07/12/17 1700  07/13/17 2331 07/14/17 0217  NA  --    < > 136 135  K  --    < > 3.9 3.8  CL  --    < > 97* 96*  CO2  --    < > 27 28  GLUCOSE  --    < >  156* 178*  BUN  --    < > 15 14  CREATININE 0.92   < > 0.78 0.75  CALCIUM  --    < > 7.8* 7.8*  MG 1.7  --   --  1.5*  PHOS  --   --   --  4.1   < > = values in this interval not displayed.   Liver Function Tests Recent Labs    07/13/17 0405 07/14/17 0217  AST 77* 49*  ALT 41 32  ALKPHOS 50 61  BILITOT 1.7* 1.0  PROT 4.5* 4.7*  ALBUMIN 2.9* 2.6*   No results for input(s): LIPASE, AMYLASE in the last 72 hours. Cardiac Enzymes No results for input(s): CKTOTAL, CKMB, CKMBINDEX, TROPONINI in the last 72 hours.  BNP: BNP (last 3 results) No results for input(s): BNP in the last 8760 hours.  ProBNP (last 3 results) No results for input(s): PROBNP in the last 8760 hours.   D-Dimer Recent Labs    07/11/17 0944  DDIMER 1.06*   Hemoglobin A1C No results for input(s): HGBA1C in the last 72 hours. Fasting Lipid Panel No results for input(s): CHOL, HDL, LDLCALC, TRIG, CHOLHDL, LDLDIRECT in the last 72 hours. Thyroid Function  Tests No results for input(s): TSH, T4TOTAL, T3FREE, THYROIDAB in the last 72 hours.  Invalid input(s): FREET3  Other results:   Imaging    No results found.  Medications:    Scheduled Medications: . aspirin EC  325 mg Oral Daily   Or  . aspirin  324 mg Per Tube Daily  . bisacodyl  10 mg Oral Daily   Or  . bisacodyl  10 mg Rectal Daily  . chlorhexidine gluconate (MEDLINE KIT)  15 mL Mouth Rinse BID  . Chlorhexidine Gluconate Cloth  6 each Topical Q0600  . docusate sodium  200 mg Oral Daily  . fentaNYL (SUBLIMAZE) injection  50 mcg Intravenous Once  . insulin aspart  0-24 Units Subcutaneous Q4H  . insulin detemir  12 Units Subcutaneous BID  . latanoprost  1 drop Right Eye QHS  . levothyroxine  100 mcg Oral QODAY  . levothyroxine  112 mcg Oral QODAY  . mouth rinse  15 mL Mouth Rinse 10 times per day  . metoCLOPramide (REGLAN) injection  10 mg Intravenous Q6H  . metoprolol tartrate  12.5 mg Oral BID   Or  . metoprolol tartrate  12.5 mg Per Tube BID  . mupirocin ointment  1 application Topical BID  . pantoprazole (PROTONIX) IV  40 mg Intravenous Q24H  . venlafaxine XR  75 mg Oral Daily    Infusions: . sodium chloride 10 mL/hr at 07/14/17 0700  . sodium chloride Stopped (07/12/17 0849)  . sodium chloride    . amiodarone 30 mg/hr (07/14/17 0700)  . calcium gluconate infusion(930 mg elemental calcium/L) 0.5 mg elemental calcium/kg/hr (07/14/17 0700)  . cefTAZidime (FORTAZ)  IV Stopped (07/14/17 0630)  . dexmedetomidine 1.2 mcg/kg/hr (07/14/17 0700)  . EPINEPHrine 4 mg in dextrose 5% 250 mL infusion (16 mcg/mL) 8 mcg/min (07/14/17 0700)  . feeding supplement (VITAL 1.5 CAL) 1,000 mL (07/14/17 0700)  . fentaNYL 100 mcg/hr (07/14/17 0700)  . furosemide (LASIX) infusion 8 mg/hr (07/14/17 0700)  . heparin 900 Units/hr (07/14/17 0700)  . insulin (NOVOLIN-R) infusion Stopped (07/13/17 1059)  . lactated ringers    . lactated ringers 10 mL/hr at 07/14/17 0700  . lactated  ringers Stopped (07/12/17 0849)  . midazolam (VERSED) infusion 2 mg/hr (07/14/17 0700)  . milrinone  0.25 mcg/kg/min (07/14/17 0700)  . nitroGLYCERIN Stopped (07/11/17 1046)  . norepinephrine (LEVOPHED) Adult infusion 8.5 mcg/min (07/14/17 0700)  . potassium chloride Stopped (07/13/17 1900)  . potassium chloride    . vancomycin Stopped (07/13/17 2130)  . vasopressin (PITRESSIN) infusion - *FOR SHOCK*      PRN Medications: sodium chloride, Place/Maintain arterial line **AND** sodium chloride, bisacodyl, docusate, fentaNYL, lactated ringers, metoprolol tartrate, midazolam, ondansetron (ZOFRAN) IV, potassium chloride, sodium chloride flush, vasopressin (PITRESSIN) infusion - *FOR SHOCK*    Patient Profile   [name] is a 77 y.o. female with history of fusiform ascending aortic aneurysm, HTN, and HLD.   Admitted 07/10/17 for planned AVR and replacement of ascending aortic aneurysm. Developed acute cardiogenic shock + VF arrest. Taken back to OR am of 07/11/17 for evacuation of hematoma and placement of RVAD.   Assessment/Plan   1. Cardiogenic shock s/p cardiac surgery with R>L CHF. - Pre-op Echo 06/02/2017 LVEF 55-60%, Grade 1 DD, Mild/Mod AI with LVOT, Mid ascending aortic diameter 48.88 mm, Mild LAE, RV normal - Improving with RVAD support - Continue pressors. Can wean NE as tolerated - PA pressures look good with good pulsativity. Will decrease RVAD to 2200RPMs repeat c-ox at noon. Follow CVPs. D/w DR. PVT at bedside - Plan RVAD explant and chest closure on Monday - Volume status much improved. Auto diuresing off lasix - Continue heparin. Watch hgb and PLTs 2. VF arrest - Stable. No further arrythmia.  - Goal K > 4.0 and Mg > 2.0. Supp aggressively.  3. Atrial fibrillation - Remains in NSR on IV amiodarone. - Follow electrolytes 4. Acute respiratory failure - Intubated and sedated. - Vent settings as above. Managed by CCM CXR ok.  5. S/p AVR and Ascending aortic aneurysm  replacement. - Per surgery. No change. 6. AKI - Resolved 7. ABLA - Hgb stable today. Transfuse as needed.   CRITICAL CARE Performed by: Glori Bickers  Total critical care time: 35 minutes  Critical care time was exclusive of separately billable procedures and treating other patients.  Critical care was necessary to treat or prevent imminent or life-threatening deterioration.  Critical care was time spent personally by me (independent of midlevel providers or residents) on the following activities: development of treatment plan with patient and/or surrogate as well as nursing, discussions with consultants, evaluation of patient's response to treatment, examination of patient, obtaining history from patient or surrogate, ordering and performing treatments and interventions, ordering and review of laboratory studies, ordering and review of radiographic studies, pulse oximetry and re-evaluation of patient's condition.    Length of Stay: 4  Glori Bickers, MD  10:36 AM Advanced Heart Failure Team Pager [phone] (M-F; 7a - 4p)  Please contact [facility] for night-coverage after hours (4p -7a ) and weekends on [email or web address]

## 2017-07-14 NOTE — Progress Notes (Signed)
OG residual checked at this time. 617ml of tube feeding removed from patient. Tube feeding stopped. RN will continue to monitor.

## 2017-07-14 NOTE — Progress Notes (Signed)
Dr [name] wants RN to restart tube feeding at 0600 in AM. Please check residuals Q4 during the night shift.

## 2017-07-14 NOTE — Progress Notes (Signed)
3 Days Post-Op Procedure(s) (LRB): TRANSESOPHAGEAL ECHOCARDIOGRAM (TEE) EXPLORATION POST OPERATIVE OPEN HEART (N/A) PLACEMENT OF CENTRIMAG VENTRICULAR ASSIST DEVICE (Right) EVACUATION HEMATOMA (N/A) Subjective:  Patient sedated on ventilator Patient status post placement of temporary percutaneous RVAD/[facility] for postop RV dysfunction after resection of 5.2 cm ascending aneurysm and AVR. Good LV function. RV assist device providing 3.6 L of flow at speed 2600   RPM Hemodynamics remained stable with co-ox 60% Patient on high-dose sedation because sternum is not closed--this has required pressors to maintain adequate SVR.. CVP 10-12 Excellent urine output and improvement in edema on Lasix drip Patient with intact neuro status Hemoglobin and platelet count are stable today on heparin 1000 units per hour for goal ACT 160 and PTT 70-80 seconds Minimal chest tube output  Objective: Vital signs in last 24 hours: Temp:  [97.5 F (36.4 C)-98.6 F (37 C)] 97.9 F (36.6 C) (01/18 1700) Pulse Rate:  [64-95] 92 (01/18 1700) Cardiac Rhythm: Normal sinus rhythm (01/18 1200) Resp:  [4-23] 4 (01/18 1700) BP: (85-125)/(46-68) 113/62 (01/18 1700) SpO2:  [94 %-100 %] 95 % (01/18 1700) Arterial Line BP: (104-151)/(45-59) 132/50 (01/18 1700) FiO2 (%):  [40 %-60 %] 40 % (01/18 1520) Weight:  [197 lb 1.5 oz (89.4 kg)] 197 lb 1.5 oz (89.4 kg) (01/18 0200)  Hemodynamic parameters for last 24 hours: PAP: (26-35)/(15-21) 26/16 CVP:  [4 mmHg-13 mmHg] 6 mmHg CO:  [3.6 L/min] 3.6 L/min CI:  [2 L/min/m2] 2 L/min/m2  Intake/Output from previous day: 01/17 0701 - 01/18 0700 In: 7127.4 [I.V.:4359.1; Blood:283.3; NG/GT:1385; IV TZGYFVCBS:4967] Out: 5916 [Urine:8375; Chest Tube:340] Intake/Output this shift: Total I/O In: 2543.9 [I.V.:1820.6; NG/GT:423.3; IV Piggyback:300] Out: 4065 [Urine:3275; Emesis/NG output:650; Chest Tube:140]       Exam    General-  comfortable sedated on ventilator    Neck- no  JVD, no cervical adenopathy palpable, no carotid bruit   Lungs- clear without rales, wheezes   Cor- regular rate and rhythm, no murmur , gallop   Abdomen- soft, non-tender   Extremities - warm, non-tender, minimal edema   Neuro- oriented, appropriate, no focal weakness   Lab Results: Recent Labs    07/13/17 0405  07/14/17 0217 07/14/17 1525  WBC 11.6*  --  10.1  --   HGB 9.3*   < > 8.8* 8.5*  HCT 26.8*   < > 26.3* 25.0*  PLT 56*  --  91*  --    < > = values in this interval not displayed.   BMET:  Recent Labs    07/13/17 2331 07/14/17 0217 07/14/17 1525  NA 136 135 133*  K 3.9 3.8 3.7  CL 97* 96* 87*  CO2 27 28  --   GLUCOSE 156* 178* 117*  BUN 15 14 15   CREATININE 0.78 0.75 0.70  CALCIUM 7.8* 7.8*  --     PT/INR: No results for input(s): LABPROT, INR in the last 72 hours. ABG    Component Value Date/Time   PHART 7.458 (H) 07/14/2017 0228   HCO3 29.8 (H) 07/14/2017 0228   TCO2 34 (H) 07/14/2017 1525   ACIDBASEDEF 2.0 07/11/2017 0958   O2SAT 71.6 07/14/2017 1700   CBG (last 3)  Recent Labs    07/14/17 0734 07/14/17 1135 07/14/17 1524  GLUCAP 142* 150* 104*    Assessment/Plan: S/P Procedure(s) (LRB): TRANSESOPHAGEAL ECHOCARDIOGRAM (TEE) EXPLORATION POST OPERATIVE OPEN HEART (N/A) PLACEMENT OF CENTRIMAG VENTRICULAR ASSIST DEVICE (Right) EVACUATION HEMATOMA (N/A) Continue heparin Continue tube feeds and IV Reglan Plan decannulation of right ventricular  VAD in OR Monday a.m. Patient's condition and plan of care discussed in ICU with patient's husband yesterday and all questions addressed.   LOS: 4 days    [name] 07/14/2017 tothank you thanks

## 2017-07-15 ENCOUNTER — Inpatient Hospital Stay (HOSPITAL_COMMUNITY): Payer: Medicare Other

## 2017-07-15 LAB — COMPREHENSIVE METABOLIC PANEL
ALT: 25 U/L (ref 14–54)
AST: 34 U/L (ref 15–41)
Albumin: 2.3 g/dL — ABNORMAL LOW (ref 3.5–5.0)
Alkaline Phosphatase: 73 U/L (ref 38–126)
Anion gap: 10 (ref 5–15)
BUN: 12 mg/dL (ref 6–20)
CO2: 29 mmol/L (ref 22–32)
Calcium: 8.9 mg/dL (ref 8.9–10.3)
Chloride: 95 mmol/L — ABNORMAL LOW (ref 101–111)
Creatinine, Ser: 0.62 mg/dL (ref 0.44–1.00)
GFR calc Af Amer: >60 mL/min (ref >60)
GFR calc non Af Amer: >60 mL/min (ref >60)
Glucose, Bld: 108 mg/dL — ABNORMAL HIGH (ref 65–99)
Potassium: 3.7 mmol/L (ref 3.5–5.1)
Sodium: 134 mmol/L — ABNORMAL LOW (ref 135–145)
Total Bilirubin: 1.1 mg/dL (ref 0.3–1.2)
Total Protein: 4.4 g/dL — ABNORMAL LOW (ref 6.5–8.1)

## 2017-07-15 LAB — POCT I-STAT 3, ART BLOOD GAS (G3+)
Acid-Base Excess: 11 mmol/L — ABNORMAL HIGH (ref 0.0–2.0)
Acid-Base Excess: 8 mmol/L — ABNORMAL HIGH (ref 0.0–2.0)
Bicarbonate: 34.3 mmol/L — ABNORMAL HIGH (ref 20.0–28.0)
Bicarbonate: 31.4 mmol/L — ABNORMAL HIGH (ref 20.0–28.0)
O2 Saturation: 95 %
O2 Saturation: 96 %
Patient temperature: 36.5
Patient temperature: 36.1
TCO2: 36 mmol/L — ABNORMAL HIGH (ref 22–32)
TCO2: 33 mmol/L — ABNORMAL HIGH (ref 22–32)
pCO2 arterial: 40.5 mmHg (ref 32.0–48.0)
pCO2 arterial: 37.4 mmHg (ref 32.0–48.0)
pH, Arterial: 7.535 — ABNORMAL HIGH (ref 7.350–7.450)
pH, Arterial: 7.529 — ABNORMAL HIGH (ref 7.350–7.450)
pO2, Arterial: 65 mmHg — ABNORMAL LOW (ref 83.0–108.0)
pO2, Arterial: 71 mmHg — ABNORMAL LOW (ref 83.0–108.0)

## 2017-07-15 LAB — CBC
HCT: 25.2 % — ABNORMAL LOW (ref 36.0–46.0)
Hemoglobin: 8.4 g/dL — ABNORMAL LOW (ref 12.0–15.0)
MCH: 28.7 pg (ref 26.0–34.0)
MCHC: 33.3 g/dL (ref 30.0–36.0)
MCV: 86 fL (ref 78.0–100.0)
Platelets: 112 10*3/uL — ABNORMAL LOW (ref 150–400)
RBC: 2.93 MIL/uL — ABNORMAL LOW (ref 3.87–5.11)
RDW: 16.3 % — ABNORMAL HIGH (ref 11.5–15.5)
WBC: 10.1 10*3/uL (ref 4.0–10.5)

## 2017-07-15 LAB — GLUCOSE, CAPILLARY
Glucose-Capillary: 107 mg/dL — ABNORMAL HIGH (ref 65–99)
Glucose-Capillary: 114 mg/dL — ABNORMAL HIGH (ref 65–99)
Glucose-Capillary: 115 mg/dL — ABNORMAL HIGH (ref 65–99)
Glucose-Capillary: 122 mg/dL — ABNORMAL HIGH (ref 65–99)

## 2017-07-15 LAB — COOXEMETRY PANEL
CARBOXYHEMOGLOBIN: 1.3 % (ref 0.5–1.5)
Carboxyhemoglobin: 1.3 % (ref 0.5–1.5)
Carboxyhemoglobin: 1.2 % (ref 0.5–1.5)
Methemoglobin: 1.5 % (ref 0.0–1.5)
Methemoglobin: 1.4 % (ref 0.0–1.5)
Methemoglobin: 1.3 % (ref 0.0–1.5)
O2 SAT: 62 %
O2 Saturation: 67.4 %
O2 Saturation: 63.5 %
TOTAL HEMOGLOBIN: 8.1 g/dL — AB (ref 12.0–16.0)
Total hemoglobin: 8.2 g/dL — ABNORMAL LOW (ref 12.0–16.0)
Total hemoglobin: 8.6 g/dL — ABNORMAL LOW (ref 12.0–16.0)

## 2017-07-15 LAB — POCT ACTIVATED CLOTTING TIME
Activated Clotting Time: 164 seconds
Activated Clotting Time: 158 seconds
Activated Clotting Time: 158 seconds

## 2017-07-15 LAB — CBC WITH DIFFERENTIAL/PLATELET
BASOS PCT: 0 %
Basophils Absolute: 0 10*3/uL (ref 0.0–0.1)
Eosinophils Absolute: 0.2 10*3/uL (ref 0.0–0.7)
Eosinophils Relative: 2 %
HEMATOCRIT: 25.3 % — AB (ref 36.0–46.0)
HEMOGLOBIN: 8.3 g/dL — AB (ref 12.0–15.0)
LYMPHS PCT: 20 %
Lymphs Abs: 1.9 10*3/uL (ref 0.7–4.0)
MCH: 27.9 pg (ref 26.0–34.0)
MCHC: 32.8 g/dL (ref 30.0–36.0)
MCV: 85.2 fL (ref 78.0–100.0)
MONO ABS: 1.1 10*3/uL — AB (ref 0.1–1.0)
MONOS PCT: 12 %
NEUTROS PCT: 66 %
Neutro Abs: 6.2 10*3/uL (ref 1.7–7.7)
Platelets: 88 10*3/uL — ABNORMAL LOW (ref 150–400)
RBC: 2.97 MIL/uL — ABNORMAL LOW (ref 3.87–5.11)
RDW: 15.9 % — AB (ref 11.5–15.5)
WBC: 9.5 10*3/uL (ref 4.0–10.5)

## 2017-07-15 LAB — PHOSPHORUS: Phosphorus: 4.5 mg/dL (ref 2.5–4.6)

## 2017-07-15 LAB — PROCALCITONIN: Procalcitonin: 0.67 ng/mL

## 2017-07-15 LAB — CULTURE, RESPIRATORY W GRAM STAIN
Culture: NORMAL
Special Requests: NORMAL

## 2017-07-15 LAB — HEPARIN LEVEL (UNFRACTIONATED): Heparin Unfractionated: 0.12 IU/mL — ABNORMAL LOW (ref 0.30–0.70)

## 2017-07-15 LAB — POCT I-STAT, CHEM 8
BUN: 10 mg/dL (ref 6–20)
Calcium, Ion: 1.28 mmol/L (ref 1.15–1.40)
Chloride: 92 mmol/L — ABNORMAL LOW (ref 101–111)
Creatinine, Ser: 0.5 mg/dL (ref 0.44–1.00)
Glucose, Bld: 125 mg/dL — ABNORMAL HIGH (ref 65–99)
HCT: 22 % — ABNORMAL LOW (ref 36.0–46.0)
Hemoglobin: 7.5 g/dL — ABNORMAL LOW (ref 12.0–15.0)
Potassium: 3.7 mmol/L (ref 3.5–5.1)
Sodium: 134 mmol/L — ABNORMAL LOW (ref 135–145)
TCO2: 30 mmol/L (ref 22–32)

## 2017-07-15 LAB — APTT
aPTT: 76 seconds — ABNORMAL HIGH (ref 24–36)
aPTT: 74 seconds — ABNORMAL HIGH (ref 24–36)

## 2017-07-15 LAB — VANCOMYCIN, TROUGH: Vancomycin Tr: 11 ug/mL — ABNORMAL LOW (ref 15–20)

## 2017-07-15 LAB — MAGNESIUM: Magnesium: 1.5 mg/dL — ABNORMAL LOW (ref 1.7–2.4)

## 2017-07-15 LAB — CALCIUM, IONIZED: Calcium, Ionized, Serum: 4.5 mg/dL (ref 4.5–5.6)

## 2017-07-15 MED ORDER — POTASSIUM CHLORIDE 10 MEQ/50ML IV SOLN
10.0000 meq | INTRAVENOUS | Status: AC
  Administered 2017-07-15 (×3): 10 meq via INTRAVENOUS
  Filled 2017-07-15: qty 50

## 2017-07-15 MED ORDER — MAGNESIUM SULFATE 4 GM/100ML IV SOLN
4.0000 g | Freq: Once | INTRAVENOUS | Status: AC
  Administered 2017-07-15: 4 g via INTRAVENOUS
  Filled 2017-07-15: qty 100

## 2017-07-15 MED ORDER — POTASSIUM CHLORIDE 10 MEQ/50ML IV SOLN
10.0000 meq | INTRAVENOUS | Status: AC | PRN
  Administered 2017-07-15 (×3): 10 meq via INTRAVENOUS
  Filled 2017-07-15 (×3): qty 50

## 2017-07-15 MED ORDER — DOCUSATE SODIUM 50 MG/5ML PO LIQD
200.0000 mg | Freq: Every day | ORAL | Status: DC
  Administered 2017-07-15 – 2017-07-16 (×2): 200 mg
  Filled 2017-07-15 (×2): qty 20

## 2017-07-15 NOTE — Progress Notes (Signed)
Pharmacy Antibiotic Note  [name] is a 77 y.o. female with ascending aortic aneurysm  S/p AVR on 1/14. She is noted s/p VF arrest post OR with tamponade is s/p hematoma evacuation and RVAD placement  Pharmacy dosing vancomycin for surgical prophylaxis for the centrimag  A 23.5 hr vanc trough was therapeutic at 11 today.   Plan: -Continue Vancomycin 1250mg  IV q24h for a goal trough of 10-15 -Will follow renal function, cultures and clinical progress   Height: 5\' 5"  (165.1 cm) Weight: 191 lb 9.3 oz (86.9 kg) IBW/kg (Calculated) : 57  Temp (24hrs), Avg:97.5 F (36.4 C), Min:96.8 F (36 C), Max:98.6 F (37 C)  Recent Labs  Lab 07/12/17 1700  07/13/17 0405  07/13/17 2331 07/14/17 0217 07/14/17 1525 07/15/17 0231 07/15/17 1539 07/15/17 1541 07/15/17 1930  WBC 13.9*  --  11.6*  --   --  10.1  --  9.5 10.1  --   --   CREATININE 0.92   < > 0.88   < > 0.78 0.75 0.70 0.62  --  0.50  --   LATICACIDVEN  --   --   --   --  1.6  --   --   --   --   --   --   VANCOTROUGH  --   --   --   --   --   --   --   --   --   --  11*   < > = values in this interval not displayed.    Estimated Creatinine Clearance: 66.2 mL/min (by C-G formula based on SCr of 0.5 mg/dL).    Allergies  Allergen Reactions  . Alendronate Sodium Nausea Only  . Levofloxacin Nausea And Vomiting  . Lipitor [Atorvastatin Calcium] Other (See Comments)    myalgias  . Lyrica [Pregabalin] Other (See Comments)    SORES IN MOUTH   . Meloxicam Other (See Comments)    Vaginal itching   . Sibutramine Hcl Monohydrate Other (See Comments)    Reaction unknown  . Actonel [Risedronate] Other (See Comments)    Bone pain and nausea   . Latex Rash    RA to latex 1982; includes bandaids       Thank you for allowing pharmacy to be a part of this patient's care.  Albertina Parr, PharmD., BCPS Clinical Pharmacist Pager [phone]

## 2017-07-15 NOTE — Progress Notes (Signed)
Dr. Roxan Hockey aware of morning ABG.  No new orders given at this time.

## 2017-07-15 NOTE — Progress Notes (Signed)
[facility] for heparin Indication: RVAD   Allergies  Allergen Reactions  . Alendronate Sodium Nausea Only  . Levofloxacin Nausea And Vomiting  . Lipitor [Atorvastatin Calcium] Other (See Comments)    myalgias  . Lyrica [Pregabalin] Other (See Comments)    SORES IN MOUTH   . Meloxicam Other (See Comments)    Vaginal itching   . Sibutramine Hcl Monohydrate Other (See Comments)    Reaction unknown  . Actonel [Risedronate] Other (See Comments)    Bone pain and nausea   . Latex Rash    RA to latex 1982; includes bandaids      Patient Measurements: Height: 5\' 5"  (165.1 cm) Weight: 191 lb 9.3 oz (86.9 kg) IBW/kg (Calculated) : 57  Vital Signs: Temp: 97.2 F (36.2 C) (01/19 0900) Temp Source: Core (01/19 0900) BP: 105/51 (01/19 0900) Pulse Rate: 85 (01/19 0900)  Labs: Recent Labs    07/13/17 0405 07/13/17 0406  07/14/17 0217 07/14/17 1519 07/14/17 1525 07/15/17 0231  HGB 9.3*  --    < > 8.8*  --  8.5* 8.3*  HCT 26.8*  --    < > 26.3*  --  25.0* 25.3*  PLT 56*  --   --  91*  --   --  88*  APTT 69*  --   --  69* 65*  --  76*  HEPARINUNFRC  --  <0.10*  --  <0.10*  --   --  0.12*  CREATININE 0.88  --    < > 0.75  --  0.70 0.62   < > = values in this interval not displayed.    Estimated Creatinine Clearance: 66.2 mL/min (by C-G formula based on SCr of 0.62 mg/dL).   Medical History: Past Medical History:  Diagnosis Date  . ACL tear    right  Dr. Gladstone Pih   . Adenomatous polyp   . Anxiety   . Aorta aneurysm ([place])   . Arthritis   . Ascending aortic aneurysm ([place])    note per chart per Dr Lucianne Lei Tright 4.7 cm 04/15/2015   . B12 deficiency   . Cancer ([place])   . Cataracts, both eyes 10/2006  . Chronic bronchitis ([place])   . Constipation   . Depression   . DUB (dysfunctional uterine bleeding) 10/96  . ETD (eustachian tube dysfunction)   . Fall   . Fibromyalgia   . GERD (gastroesophageal reflux disease)   . Glaucoma    ([place])  Dr. Arnoldo Morale   . Glaucoma    bilaterally  . Helicobacter pylori (H. pylori)   . Hiatal hernia   . History of bronchitis   . History of frequent urinary tract infections   . History of kidney stones   . History of left shoulder fracture    pt states fell off bed and broke ball in shoulder had rod placed   . Hyperlipidemia   . Hypertension   . Hypothyroidism   . Insomnia   . Leg wound, left    healed   . MVP (mitral valve prolapse)   . Occasional tremors    left arm   . Osteoarthritis   . Osteopenia   . Osteoporosis   . Other and unspecified hyperlipidemia   . Post-menopausal   . Thyroid cancer ([place])   . Thyroid nodule   . Tremor     Assessment: 77 year old female s/p centrimag placement 1/15.   Following nursing driven ACT monitoring. Current ACT 164, pltc  stable at 43 (SRA continues to be in process). Aptt 70s. Hgb stable in 8s.  Systemic heparin running at 1000 units/hr per MD orders, no adjustment per surgery.  Patient also continues on vancomycin for surgical prophylaxis, will check trough tonight to ensure she is not accumulating.   Goal of Therapy:  Follow ACT flowsheet Monitor platelets by anticoagulation protocol: Yes   Plan:  Continue heparin with MD dosing Pharmacy to continue to follow peripherally Vancomycin trough tonight  Georgina Peer 07/15/2017,9:22 AM

## 2017-07-15 NOTE — Progress Notes (Signed)
4 Days Post-Op Procedure(s) (LRB): TRANSESOPHAGEAL ECHOCARDIOGRAM (TEE) EXPLORATION POST OPERATIVE OPEN HEART (N/A) PLACEMENT OF CENTRIMAG VENTRICULAR ASSIST DEVICE (Right) EVACUATION HEMATOMA (N/A) Subjective: sedated  Objective: Vital signs in last 24 hours: Temp:  [96.8 F (36 C)-98.4 F (36.9 C)] 96.8 F (36 C) (01/19 0800) Pulse Rate:  [82-105] 87 (01/19 0800) Cardiac Rhythm: Normal sinus rhythm (01/19 0800) Resp:  [4-23] 15 (01/19 0800) BP: (85-161)/(46-81) 113/60 (01/19 0800) SpO2:  [94 %-100 %] 97 % (01/19 0800) Arterial Line BP: (85-199)/(33-74) 145/52 (01/19 0800) FiO2 (%):  [40 %-50 %] 50 % (01/19 0400) Weight:  [191 lb 9.3 oz (86.9 kg)] 191 lb 9.3 oz (86.9 kg) (01/19 0300)  Hemodynamic parameters for last 24 hours: PAP: (22-33)/(11-19) 26/14 CVP:  [2 mmHg-10 mmHg] 6 mmHg  Intake/Output from previous day: 01/18 0701 - 01/19 0700 In: 5976.3 [I.V.:4483; NG/GT:593.3; IV Piggyback:900] Out: 6960 [Urine:5900; Emesis/NG output:750; Chest Tube:310] Intake/Output this shift: No intake/output data recorded.  Neurologic: responds with wake up assessment Heart: regular rate and rhythm Lungs: rhonchi bilaterally Abdomen: normal findings: soft, non-tender Extremities: well perfused  Lab Results: Recent Labs    07/14/17 0217 07/14/17 1525 07/15/17 0231  WBC 10.1  --  9.5  HGB 8.8* 8.5* 8.3*  HCT 26.3* 25.0* 25.3*  PLT 91*  --  88*   BMET:  Recent Labs    07/14/17 0217 07/14/17 1525 07/15/17 0231  NA 135 133* 134*  K 3.8 3.7 3.7  CL 96* 87* 95*  CO2 28  --  29  GLUCOSE 178* 117* 108*  BUN 14 15 12   CREATININE 0.75 0.70 0.62  CALCIUM 7.8*  --  8.9    PT/INR: No results for input(s): LABPROT, INR in the last 72 hours. ABG    Component Value Date/Time   PHART 7.535 (H) 07/15/2017 0244   HCO3 34.3 (H) 07/15/2017 0244   TCO2 36 (H) 07/15/2017 0244   ACIDBASEDEF 2.0 07/11/2017 0958   O2SAT 95.0 07/15/2017 0244   CBG (last 3)  Recent Labs     07/14/17 2323 07/15/17 0341 07/15/17 0728  GLUCAP 126* 107* 114*    Assessment/Plan: S/P Procedure(s) (LRB): TRANSESOPHAGEAL ECHOCARDIOGRAM (TEE) EXPLORATION POST OPERATIVE OPEN HEART (N/A) PLACEMENT OF CENTRIMAG VENTRICULAR ASSIST DEVICE (Right) EVACUATION HEMATOMA (N/A) - CV- hemodynamically stable with RVAD, epi, norepi, milrinone  filling pressures normal, co-ox= 67  RESP- On vent support  RENAL- Large volume diuresis over past 48 hours- lasix stopped  Metabolic alkalosis  Supplement K  ENDO_ CBG well controlled  Nutrition- on tube feedings  Plan is return to OR on Monday  LOS: 5 days    Melrose Nakayama 07/15/2017

## 2017-07-15 NOTE — Progress Notes (Signed)
      [address]       [place] [address]             [phone]      Intubated, sedated  BP 131/73 (BP Location: Right Arm)   Pulse 89   Temp 98.1 F (36.7 C)   Resp 15   Ht 5\' 5"  (1.651 m)   Wt 191 lb 9.3 oz (86.9 kg)   SpO2 99%   BMI 31.88 kg/m  31/15  RVAD flow down to 3 L/min  Intake/Output Summary (Last 24 hours) at 07/15/2017 1843 Last data filed at 07/15/2017 1800 Gross per 24 hour  Intake 6372.76 ml  Output 4435 ml  Net 1937.76 ml   Levophed up from 4 to 10 after RVAD flow decreased  Remo Lipps C. Roxan Hockey, MD [facility] [phone]

## 2017-07-15 NOTE — Progress Notes (Signed)
.. ..  Name: [name] MRN: [id number] DOB: January 14, 1941    ADMISSION DATE:  07/10/2017  CONSULTATION DATE:  07/12/17  REFERRING MD :  Nils Pyle MD  CHIEF COMPLAINT:   BRIEF PATIENT DESCRIPTION: SIGNIFICANT EVENTS  77 yr old female POD 1 TEE, Open Heart with evacuation of Hematoma and placement of RVAD.  STUDIES: Echo12/12/2016 LVEF 55-60%, Grade 1 DD, Mild/Mod AI with LVOT, Mid ascending aortic diameter 48.88 mm, Mild LAE, RV normal    TEE  BRIEF:   77 year old female with past medical history significant for fusiform ascending aortic aneurysm, hypertension, hyperlipidemia, status post AVR and replacement of ascending aortic aneurysm.  On 07/10/2017 postprocedure patient developed acute cardiogenic shock had an episode of V. fib arrest was taken back to the OR on 07/11/2017 for evacuation of hematoma and placement of RVAD.  PCCM consulted for ventilator management.   Events - 07/10/17 Admit - 07/10/17 S/p AVR and ascending AA replacement  - 07/10/17 VF Arrest in evening with shock x 1 - 07/11/17 to OR for evacuation of hematoma and RVAD placement  - 07/12/17 PCCM consult 07/15/2017 Lasix per cardiology CVP of 5      SUBJECTIVE/OVERNIGHT/INTERVAL HX Heavily sedated on the ventilator, hemodynamically stable     VITAL SIGNS: Temp:  [96.8 F (36 C)-98.4 F (36.9 C)] 97.3 F (36.3 C) (01/19 1115) Pulse Rate:  [82-105] 87 (01/19 1115) Resp:  [4-23] 15 (01/19 1115) BP: (99-161)/(46-81) 119/65 (01/19 1100) SpO2:  [94 %-100 %] 98 % (01/19 1115) Arterial Line BP: (85-199)/(33-74) 122/50 (01/19 1115) FiO2 (%):  [40 %-50 %] 50 % (01/19 0801) Weight:  [86.9 kg (191 lb 9.3 oz)] 86.9 kg (191 lb 9.3 oz) (01/19 0300)  PHYSICAL EXAMINATION:  General: Frail elderly female currently on multiple pressors and hemodynamic support LVAD on vent HEENT: Tracheal tube to vent, NG tube PSY: Sedated Neuro: Sedated CV: Distant PULM: Decreased breath sounds in the bases* WV:PXTG,  nontender Extremities: warm/dry 2+ edema, cool lower extremities Skin: no rashes or lesions    PULMONARY Recent Labs  Lab 07/13/17 0432  07/14/17 0228  07/14/17 1525 07/14/17 1700 07/14/17 2238 07/15/17 0235 07/15/17 0244 07/15/17 0815  PHART 7.506*  --  7.458*  --   --   --  7.516*  --  7.535* 7.529*  PCO2ART 33.1  --  41.9  --   --   --  39.8  --  40.5 37.4  PO2ART 59.0*  --  63.0*  --   --   --  64.0*  --  65.0* 71.0*  HCO3 26.5  --  29.8*  --   --   --  32.3*  --  34.3* 31.4*  TCO2 28   < > 31  --  34*  --  33*  --  36* 33*  O2SAT 94.0   < > 93.0   < >  --  71.6 95.0 67.4 95.0 96.0   < > = values in this interval not displayed.    CBC Recent Labs  Lab 07/13/17 0405  07/14/17 0217 07/14/17 1525 07/15/17 0231  HGB 9.3*   < > 8.8* 8.5* 8.3*  HCT 26.8*   < > 26.3* 25.0* 25.3*  WBC 11.6*  --  10.1  --  9.5  PLT 56*  --  91*  --  88*   < > = values in this interval not displayed.    COAGULATION Recent Labs  Lab 07/10/17 1500 07/11/17 0944 07/11/17 1037 07/11/17 1259 07/11/17 1552  INR 1.67 1.76 1.73 1.85 1.71    CARDIAC  No results for input(s): TROPONINI in the last 168 hours. No results for input(s): PROBNP in the last 168 hours.   CHEMISTRY Recent Labs  Lab 07/11/17 1552  07/12/17 0323  07/12/17 1700  07/13/17 0405  07/13/17 2154 07/13/17 2331 07/14/17 0217 07/14/17 1525 07/15/17 0231  NA  --    < > 140   < >  --    < > 138   < > 135 136 135 133* 134*  K  --    < > 3.7   < >  --    < > 3.8   < > 3.7 3.9 3.8 3.7 3.7  CL  --    < > 106   < >  --    < > 102   < > 92* 97* 96* 87* 95*  CO2  --   --  23  --   --   --  26  --   --  27 28  --  29  GLUCOSE  --    < > 145*   < >  --    < > 118*   < > 138* 156* 178* 117* 108*  BUN  --    < > 18   < >  --    < > 19   < > 16 15 14 15 12   CREATININE  --    < > 0.94   < > 0.92   < > 0.88   < > 0.80 0.78 0.75 0.70 0.62  CALCIUM  --   --  7.6*  --   --   --  6.9*  --   --  7.8* 7.8*  --  8.9  MG 2.6*  --   2.1  --  1.7  --   --   --   --   --  1.5*  --  1.5*  PHOS  --   --   --   --   --   --   --   --   --   --  4.1  --  4.5   < > = values in this interval not displayed.   Estimated Creatinine Clearance: 66.2 mL/min (by C-G formula based on SCr of 0.62 mg/dL).   LIVER Recent Labs  Lab 07/10/17 1500  07/11/17 0944 07/11/17 1037 07/11/17 1259 07/11/17 1552 07/12/17 0323 07/13/17 0405 07/14/17 0217 07/15/17 0231  AST  --    < > 151* 150*  --   --  110* 77* 49* 34  ALT  --    < > 69* 70*  --   --  50 41 32 25  ALKPHOS  --    < > 46 46  --   --  40 50 61 73  BILITOT  --    < > 2.0* 2.0*  --   --  1.3* 1.7* 1.0 1.1  PROT  --    < > 4.2* 4.2*  --   --  3.9* 4.5* 4.7* 4.4*  ALBUMIN  --    < > 3.1* 3.1*  --   --  2.9* 2.9* 2.6* 2.3*  INR 1.67  --  1.76 1.73 1.85 1.71  --   --   --   --    < > = values in this interval not displayed.     INFECTIOUS Recent Labs  Lab 07/13/17 2331 07/15/17 0231  LATICACIDVEN 1.6  --   PROCALCITON  --  0.67     ENDOCRINE CBG (last 3)  Recent Labs    07/14/17 2323 07/15/17 0341 07/15/17 0728  GLUCAP 126* 107* 114*         IMAGING x48h  - image(s) personally visualized  -   highlighted in bold Dg Chest Port 1 View  Result Date: 07/15/2017 CLINICAL DATA:  Ventricular assist device EXAM: PORTABLE CHEST 1 VIEW COMPARISON:  Yesterday FINDINGS: Swan-Ganz catheter from the right with tip over the main pulmonary artery. Ventricular assist cannula in stable position. Chronic cardiomegaly. Aortic valve replacement. A sponge has been removed from over the left chest. Thoracic drains. Lower endotracheal tube, tip 1 cm above the carina. Feeding tube at least reaches the stomach. Left subclavian central line in stable position. Stable cardiomegaly. Stable low volume chest with haziness of the left more than right chest suggesting atelectasis and pleural fluid. No definite pneumothorax. IMPRESSION: 1. Lower endotracheal tube, tip 1 cm above the carina. 2.  Otherwise stable hardware. 3. Low volume chest with asymmetric left atelectasis and/or layering pleural fluid. Electronically Signed   By: Monte Fantasia M.D.   On: 07/15/2017 07:10   Dg Chest Port 1 View  Result Date: 07/14/2017 CLINICAL DATA:  Right ventricular assistance device. EXAM: PORTABLE CHEST 1 VIEW COMPARISON:  Radiograph of July 13, 2017. FINDINGS: Stable cardiomegaly. Endotracheal and nasogastric tubes are unchanged in position. Right-sided chest tube is unchanged without pneumothorax. Right internal jugular Swan-Ganz catheter is noted with tip in expected position of main pulmonary artery. Status post aortic valve replacement. Ventricular assist device components are unchanged. Right lung is clear. Stable left left lung opacity is noted concerning for infiltrate or atelectasis with probable mild left pleural effusion. Postsurgical changes are seen in the left shoulder. IMPRESSION: Stable support apparatus. Stable left lung opacity concerning for infiltrate or atelectasis with probable mild left pleural effusion. Electronically Signed   By: Marijo Conception, M.D.   On: 07/14/2017 09:20   Dg Abd Portable 1v  Result Date: 07/15/2017 CLINICAL DATA:  Encounter for feeding tube placement. EXAM: PORTABLE ABDOMEN - 1 VIEW COMPARISON:  07/13/2017. chest radiograph 07/15/2017 FINDINGS: Feeding tube is extending to the abdomen. The tip is in the distal stomach region. Feeding tube location has not significantly changed since 07/13/2017. Consolidation or volume loss at the left lung base. Again noted is a ventricular assist device. Swan-Ganz catheter extends into the main pulmonary artery. Aortic valve prosthesis. IMPRESSION: Feeding tube tip in the distal stomach region. Electronically Signed   By: Markus Daft M.D.   On: 07/15/2017 10:53        ASSESSMENT / PLAN: 77 year old female with PMHx significant for fusiform ascending aortic aneurysm, hypertension, hyperlipidemia, status post AVR and  replacement of ascending aortic aneurysm.  On 07/10/2017 postprocedure patient developed acute cardiogenic shock had an episode of V. fib arrest was taken back to the OR on 07/11/2017 for evacuation of hematoma and placement of RVAD.   ASSESSMENT / PLAN:  PULMONARY A: #current: acute resp failure   07/15/2017 -> acute resp faiure - does not meet sbt criteria  P:   Full vent support Wean FIO2 as able Sat goals are > 94% CXR daily ABG prn  CARDIOVASCULAR A:  Atrial fibrillation per tele #current circulatyr cardiogenic shock 1/18>>Coox 65.4%  on Norepi 8, Epi 8, and Milrinone 0.25. CVP 8-9 07/15/2017 -> multiple pressors and ionotrpes and amio RVAD Settings Per CV TS and cardiology P:  Continue Amio Per CVTS Diuresis per cardiology Mag and K repletion per cards  No CPR for now  2/2 open chest  RENAL  Intake/Output Summary (Last 24 hours) at 07/15/2017 1145 Last data filed at 07/15/2017 1100 Gross per 24 hour  Intake 5803.54 ml  Output 6065 ml  Net -261.46 ml   Recent Labs  Lab 07/13/17 2154 07/13/17 2331 07/14/17 0217 07/14/17 1525 07/15/17 0231  CREATININE 0.80 0.78 0.75 0.70 0.62   Filed Weights   07/13/17 0426 07/14/17 0200 07/15/17 0300  Weight: 92.6 kg (204 lb 2.3 oz) 89.4 kg (197 lb 1.5 oz) 86.9 kg (191 lb 9.3 oz)     A:    low calciuum Good UO Lasix gtt decreased to 4 mg/ hr per cards Mag goal > 2, K goal > 4  P:  Replete calcium currently on calcium drip Replete mag and K per cards Maintain renal perfusion Avoid nephrotoxic medications Monitor UO  GASTROINTESTINAL A:   On TF. Mil transaminitis  P:   Continue TF Hold tylenol   HEMATOLOGIC Recent Labs  Lab 07/13/17 0405  07/14/17 0217 07/14/17 1525 07/15/17 0231  HGB 9.3*   < > 8.8* 8.5* 8.3*  HCT 26.8*   < > 26.3* 25.0* 25.3*  WBC 11.6*  --  10.1  --  9.5  PLT 56*  --  91*  --  88*   < > = values in this interval not displayed.    A:   #RBC: anemia of criticla  illness #Platelet thrombocytopenia - noted on IV heparin but proifile does not fitt HITT #WBC improving leukocytosis  P:  -Transfuse per protocol CBC daily Monitor for active bleeding Heparin per CVTS   INFECTIOUS Recent Labs  Lab 07/15/17 0231  PROCALCITON 0.67     A:   On empiric antibiotics 117 Fortaz 115 vancomycin 1/18 CXR with? infiltrates P:   Follow WBC and fever curve [employer] 07/15/2017 pro-calcitonin 0.6 Re-culture as is clinically indicated  ENDOCRINE CBG (last 3)  Recent Labs    07/14/17 2323 07/15/17 0341 07/15/17 0728  GLUCAP 126* 107* 114*   A:   At risk hyperglycemia P:   ICU hyperglycemia protocol CBG Q 4 Sliding scale insulin  NEUROLOGIC A:   Sedated RASS Goal -4  07/15/2017 -> RASS -4 on sedation gtt Remains maxed on Precedex, 100 Fentanyl, 2 versed P:   RASS goal: -3 with RVAD Dc prn oxyucodone, morphine and tylneol Fent gtt Versed gtt Continue Precedex   FAMILY  - Updates: 07/15/2017 family updated at bedside  - Inter-disciplinary family meet or Palliative Care meeting due by:  DAy 7. Current LOS is LOS 5 days   DISPO ICU on life support  My CCT 40 min  Richardson Landry Vassie Kugel ACNP Maryanna Shape PCCM Pager [phone] till 1 pm If no answer page [phone] 07/15/2017, 11:45 AM

## 2017-07-15 NOTE — Plan of Care (Signed)
  Progressing Clinical Measurements: Will remain free from infection 07/15/2017 0418 - Progressing by Alonna Buckler, RN Diagnostic test results will improve 07/15/2017 0418 - Progressing by Alonna Buckler, RN Respiratory complications will improve 07/15/2017 0418 - Progressing by Alonna Buckler, RN Cardiovascular complication will be avoided 07/15/2017 0418 - Progressing by Alonna Buckler, RN Elimination: Will not experience complications related to urinary retention 07/15/2017 0418 - Progressing by Alonna Buckler, RN Skin Integrity: Risk for impaired skin integrity will decrease 07/15/2017 0418 - Progressing by Alonna Buckler, RN Cardiac: Hemodynamic stability will improve 07/15/2017 0418 - Progressing by Alonna Buckler, RN Note Remains on pressors to maintain hemodynamics: epi and levo.  Clinical Measurements: Postoperative complications will be avoided or minimized 07/15/2017 0418 - Progressing by Alonna Buckler, RN Respiratory: Respiratory status will improve 07/15/2017 0418 - Progressing by Alonna Buckler, RN Skin Integrity: Wound healing without signs and symptoms of infection 07/15/2017 0418 - Progressing by Alonna Buckler, RN Risk for impaired skin integrity will decrease 07/15/2017 0418 - Progressing by Alonna Buckler, RN Urinary Elimination: Ability to achieve and maintain adequate renal perfusion and functioning will improve 07/15/2017 0418 - Progressing by Alonna Buckler, RN

## 2017-07-16 ENCOUNTER — Inpatient Hospital Stay (HOSPITAL_COMMUNITY): Payer: Medicare Other

## 2017-07-16 LAB — CBC WITH DIFFERENTIAL/PLATELET
BASOS PCT: 1 %
Basophils Absolute: 0.1 10*3/uL (ref 0.0–0.1)
Eosinophils Absolute: 0.3 10*3/uL (ref 0.0–0.7)
Eosinophils Relative: 2 %
HCT: 24.7 % — ABNORMAL LOW (ref 36.0–46.0)
Hemoglobin: 8 g/dL — ABNORMAL LOW (ref 12.0–15.0)
LYMPHS ABS: 1.5 10*3/uL (ref 0.7–4.0)
Lymphocytes Relative: 14 %
MCH: 28.2 pg (ref 26.0–34.0)
MCHC: 32.4 g/dL (ref 30.0–36.0)
MCV: 87 fL (ref 78.0–100.0)
MONOS PCT: 15 %
Monocytes Absolute: 1.7 10*3/uL — ABNORMAL HIGH (ref 0.1–1.0)
NEUTROS PCT: 68 %
Neutro Abs: 7.5 10*3/uL (ref 1.7–7.7)
Platelets: 125 10*3/uL — ABNORMAL LOW (ref 150–400)
RBC: 2.84 MIL/uL — AB (ref 3.87–5.11)
RDW: 16.3 % — ABNORMAL HIGH (ref 11.5–15.5)
WBC: 11 10*3/uL — ABNORMAL HIGH (ref 4.0–10.5)

## 2017-07-16 LAB — POCT ACTIVATED CLOTTING TIME
Activated Clotting Time: 153 seconds
Activated Clotting Time: 153 seconds
Activated Clotting Time: 153 seconds
Activated Clotting Time: 153 seconds

## 2017-07-16 LAB — BASIC METABOLIC PANEL
ANION GAP: 10 (ref 5–15)
BUN: 10 mg/dL (ref 6–20)
CHLORIDE: 98 mmol/L — AB (ref 101–111)
CO2: 26 mmol/L (ref 22–32)
Calcium: 8.7 mg/dL — ABNORMAL LOW (ref 8.9–10.3)
Creatinine, Ser: 0.58 mg/dL (ref 0.44–1.00)
GFR calc Af Amer: >60 mL/min (ref >60)
GFR calc non Af Amer: >60 mL/min (ref >60)
Glucose, Bld: 175 mg/dL — ABNORMAL HIGH (ref 65–99)
Potassium: 4.2 mmol/L (ref 3.5–5.1)
SODIUM: 134 mmol/L — AB (ref 135–145)

## 2017-07-16 LAB — CALCIUM, IONIZED: Calcium, Ionized, Serum: 5.3 mg/dL (ref 4.5–5.6)

## 2017-07-16 LAB — POCT I-STAT 3, ART BLOOD GAS (G3+)
Acid-Base Excess: 4 mmol/L — ABNORMAL HIGH (ref 0.0–2.0)
Bicarbonate: 28.8 mmol/L — ABNORMAL HIGH (ref 20.0–28.0)
O2 Saturation: 95 %
Patient temperature: 37.3
TCO2: 30 mmol/L (ref 22–32)
pCO2 arterial: 45.5 mmHg (ref 32.0–48.0)
pH, Arterial: 7.411 (ref 7.350–7.450)
pO2, Arterial: 78 mmHg — ABNORMAL LOW (ref 83.0–108.0)

## 2017-07-16 LAB — GLUCOSE, CAPILLARY
Glucose-Capillary: 137 mg/dL — ABNORMAL HIGH (ref 65–99)
Glucose-Capillary: 159 mg/dL — ABNORMAL HIGH (ref 65–99)
Glucose-Capillary: 164 mg/dL — ABNORMAL HIGH (ref 65–99)
Glucose-Capillary: 169 mg/dL — ABNORMAL HIGH (ref 65–99)
Glucose-Capillary: 169 mg/dL — ABNORMAL HIGH (ref 65–99)
Glucose-Capillary: 172 mg/dL — ABNORMAL HIGH (ref 65–99)
Glucose-Capillary: 178 mg/dL — ABNORMAL HIGH (ref 65–99)

## 2017-07-16 LAB — MAGNESIUM: MAGNESIUM: 1.5 mg/dL — AB (ref 1.7–2.4)

## 2017-07-16 LAB — PROCALCITONIN: Procalcitonin: 0.5 ng/mL

## 2017-07-16 LAB — POCT I-STAT, CHEM 8
BUN: 10 mg/dL (ref 6–20)
Calcium, Ion: 1.2 mmol/L (ref 1.15–1.40)
Chloride: 93 mmol/L — ABNORMAL LOW (ref 101–111)
Creatinine, Ser: 0.5 mg/dL (ref 0.44–1.00)
Glucose, Bld: 137 mg/dL — ABNORMAL HIGH (ref 65–99)
HCT: 25 % — ABNORMAL LOW (ref 36.0–46.0)
Hemoglobin: 8.5 g/dL — ABNORMAL LOW (ref 12.0–15.0)
Potassium: 3.9 mmol/L (ref 3.5–5.1)
Sodium: 133 mmol/L — ABNORMAL LOW (ref 135–145)
TCO2: 26 mmol/L (ref 22–32)

## 2017-07-16 LAB — COOXEMETRY PANEL
Carboxyhemoglobin: 1.3 % (ref 0.5–1.5)
Carboxyhemoglobin: 1.7 % — ABNORMAL HIGH (ref 0.5–1.5)
Methemoglobin: 1.4 % (ref 0.0–1.5)
Methemoglobin: 1.2 % (ref 0.0–1.5)
O2 Saturation: 57 %
O2 Saturation: 64.2 %
Total hemoglobin: 7.5 g/dL — ABNORMAL LOW (ref 12.0–16.0)
Total hemoglobin: 8.7 g/dL — ABNORMAL LOW (ref 12.0–16.0)

## 2017-07-16 LAB — HEPARIN LEVEL (UNFRACTIONATED): Heparin Unfractionated: 0.14 IU/mL — ABNORMAL LOW (ref 0.30–0.70)

## 2017-07-16 LAB — PHOSPHORUS: Phosphorus: 5.6 mg/dL — ABNORMAL HIGH (ref 2.5–4.6)

## 2017-07-16 LAB — PREPARE RBC (CROSSMATCH)

## 2017-07-16 LAB — APTT
aPTT: 57 seconds — ABNORMAL HIGH (ref 24–36)
aPTT: 67 seconds — ABNORMAL HIGH (ref 24–36)

## 2017-07-16 MED ORDER — NITROGLYCERIN IN D5W 200-5 MCG/ML-% IV SOLN
2.0000 ug/min | INTRAVENOUS | Status: DC
  Filled 2017-07-16: qty 250

## 2017-07-16 MED ORDER — CHLORHEXIDINE GLUCONATE CLOTH 2 % EX PADS
6.0000 | MEDICATED_PAD | Freq: Once | CUTANEOUS | Status: AC
  Administered 2017-07-16: 6 via TOPICAL

## 2017-07-16 MED ORDER — MILRINONE LACTATE IN DEXTROSE 20-5 MG/100ML-% IV SOLN
0.2500 ug/kg/min | INTRAVENOUS | Status: AC
  Administered 2017-07-17: 0.25 ug/kg/min via INTRAVENOUS
  Filled 2017-07-16: qty 100

## 2017-07-16 MED ORDER — FUROSEMIDE 10 MG/ML IJ SOLN
20.0000 mg | Freq: Once | INTRAMUSCULAR | Status: AC
  Administered 2017-07-16: 20 mg via INTRAVENOUS
  Filled 2017-07-16: qty 2

## 2017-07-16 MED ORDER — MAGNESIUM SULFATE 4 GM/100ML IV SOLN
4.0000 g | Freq: Once | INTRAVENOUS | Status: AC
  Administered 2017-07-16: 4 g via INTRAVENOUS
  Filled 2017-07-16: qty 100

## 2017-07-16 MED ORDER — EPINEPHRINE PF 1 MG/ML IJ SOLN
0.0000 ug/min | INTRAMUSCULAR | Status: DC
  Filled 2017-07-16 (×2): qty 4

## 2017-07-16 MED ORDER — DEXMEDETOMIDINE HCL IN NACL 400 MCG/100ML IV SOLN
0.1000 ug/kg/h | INTRAVENOUS | Status: DC
  Filled 2017-07-16: qty 100

## 2017-07-16 MED ORDER — CHLORHEXIDINE GLUCONATE 0.12 % MT SOLN
15.0000 mL | Freq: Once | OROMUCOSAL | Status: AC
  Administered 2017-07-17: 15 mL via OROMUCOSAL
  Filled 2017-07-16: qty 15

## 2017-07-16 MED ORDER — DEXTROSE 5 % IV SOLN
750.0000 mg | INTRAVENOUS | Status: DC
  Filled 2017-07-16: qty 750

## 2017-07-16 MED ORDER — VANCOMYCIN HCL 10 G IV SOLR
1250.0000 mg | INTRAVENOUS | Status: DC
  Filled 2017-07-16: qty 1250

## 2017-07-16 MED ORDER — SODIUM CHLORIDE 0.9 % IV SOLN
INTRAVENOUS | Status: AC
  Administered 2017-07-17: 1.5 [IU]/h via INTRAVENOUS
  Administered 2017-07-17: 2.5 [IU]/h via INTRAVENOUS
  Filled 2017-07-16: qty 1

## 2017-07-16 MED ORDER — MAGNESIUM SULFATE 50 % IJ SOLN
40.0000 meq | INTRAMUSCULAR | Status: DC
  Filled 2017-07-16: qty 9.85

## 2017-07-16 MED ORDER — PLASMA-LYTE 148 IV SOLN
INTRAVENOUS | Status: DC
  Filled 2017-07-16: qty 2.5

## 2017-07-16 MED ORDER — SODIUM CHLORIDE 0.9 % IV SOLN
30.0000 ug/min | INTRAVENOUS | Status: DC
  Filled 2017-07-16: qty 2

## 2017-07-16 MED ORDER — DOPAMINE-DEXTROSE 3.2-5 MG/ML-% IV SOLN
0.0000 ug/kg/min | INTRAVENOUS | Status: DC
  Filled 2017-07-16 (×2): qty 250

## 2017-07-16 MED ORDER — TRANEXAMIC ACID (OHS) BOLUS VIA INFUSION
15.0000 mg/kg | INTRAVENOUS | Status: DC
  Filled 2017-07-16: qty 1350

## 2017-07-16 MED ORDER — SODIUM CHLORIDE 0.9 % IV SOLN
INTRAVENOUS | Status: DC
  Filled 2017-07-16: qty 30

## 2017-07-16 MED ORDER — TRANEXAMIC ACID (OHS) PUMP PRIME SOLUTION
2.0000 mg/kg | INTRAVENOUS | Status: DC
  Filled 2017-07-16: qty 1.8

## 2017-07-16 MED ORDER — POTASSIUM CHLORIDE 2 MEQ/ML IV SOLN
80.0000 meq | INTRAVENOUS | Status: DC
  Filled 2017-07-16: qty 40

## 2017-07-16 MED ORDER — BISACODYL 5 MG PO TBEC: 5.0000 mg | DELAYED_RELEASE_TABLET | Freq: Once | ORAL | Status: DC

## 2017-07-16 MED ORDER — TRANEXAMIC ACID 1000 MG/10ML IV SOLN
1.5000 mg/kg/h | INTRAVENOUS | Status: DC
  Filled 2017-07-16: qty 25

## 2017-07-16 MED ORDER — DEXTROSE 5 % IV SOLN
1.5000 g | INTRAVENOUS | Status: AC
  Administered 2017-07-17: 1.5 g via INTRAVENOUS
  Filled 2017-07-16: qty 1.5

## 2017-07-16 NOTE — Progress Notes (Signed)
80ml of expired Versed gtt wasted/rinsed down sink. Witnessed by 2nd RN, Ezekiel Slocumb

## 2017-07-16 NOTE — Progress Notes (Signed)
[place] for heparin Indication: RVAD   Allergies  Allergen Reactions  . Alendronate Sodium Nausea Only  . Levofloxacin Nausea And Vomiting  . Lipitor [Atorvastatin Calcium] Other (See Comments)    myalgias  . Lyrica [Pregabalin] Other (See Comments)    SORES IN MOUTH   . Meloxicam Other (See Comments)    Vaginal itching   . Sibutramine Hcl Monohydrate Other (See Comments)    Reaction unknown  . Actonel [Risedronate] Other (See Comments)    Bone pain and nausea   . Latex Rash    RA to latex 1982; includes bandaids      Patient Measurements: Height: 5\' 5"  (165.1 cm) Weight: 198 lb 6.6 oz (90 kg) IBW/kg (Calculated) : 57  Vital Signs: Temp: 98.8 F (37.1 C) (01/20 0730) Temp Source: Core (01/20 0400) BP: 109/57 (01/20 0700) Pulse Rate: 95 (01/20 0730)  Labs: Recent Labs    07/14/17 0217  07/15/17 0231 07/15/17 1533 07/15/17 1539 07/15/17 1541 07/16/17 0233  HGB 8.8*   < > 8.3*  --  8.4* 7.5* 8.0*  HCT 26.3*   < > 25.3*  --  25.2* 22.0* 24.7*  PLT 91*  --  88*  --  112*  --  125*  APTT 69*   < > 76* 74*  --   --  57*  HEPARINUNFRC <0.10*  --  0.12*  --   --   --   --   CREATININE 0.75   < > 0.62  --   --  0.50 0.58   < > = values in this interval not displayed.    Estimated Creatinine Clearance: 67.3 mL/min (by C-G formula based on SCr of 0.58 mg/dL).   Medical History: Past Medical History:  Diagnosis Date  . ACL tear    right  Dr. Gladstone Pih   . Adenomatous polyp   . Anxiety   . Aorta aneurysm ([place])   . Arthritis   . Ascending aortic aneurysm ([place])    note per chart per Dr Lucianne Lei Tright 4.7 cm 04/15/2015   . B12 deficiency   . Cancer ([place])   . Cataracts, both eyes 10/2006  . Chronic bronchitis ([place])   . Constipation   . Depression   . DUB (dysfunctional uterine bleeding) 10/96  . ETD (eustachian tube dysfunction)   . Fall   . Fibromyalgia   . GERD (gastroesophageal reflux disease)   . Glaucoma    ([place]) Dr.  Arnoldo Morale   . Glaucoma    bilaterally  . Helicobacter pylori (H. pylori)   . Hiatal hernia   . History of bronchitis   . History of frequent urinary tract infections   . History of kidney stones   . History of left shoulder fracture    pt states fell off bed and broke ball in shoulder had rod placed   . Hyperlipidemia   . Hypertension   . Hypothyroidism   . Insomnia   . Leg wound, left    healed   . MVP (mitral valve prolapse)   . Occasional tremors    left arm   . Osteoarthritis   . Osteopenia   . Osteoporosis   . Other and unspecified hyperlipidemia   . Post-menopausal   . Thyroid cancer ([place])   . Thyroid nodule   . Tremor     Assessment: 77 year old female s/p centrimag placement 1/15.   Following nursing driven ACT monitoring. Current ACT 153, pltc improved to 125 (  SRA continues to be in process). Aptt was in 70s yesterday now low this morning at 57s. Hgb stable at 8.  Systemic heparin running at 1000 units/hr per MD orders, adjustment if needed per surgery.  Patient also continues on vancomycin for surgical prophylaxis, trough last night was in goal, will likely continue until centrimag is removed.  Goal of Therapy:  Follow ACT flowsheet Monitor platelets by anticoagulation protocol: Yes   Plan:  Continue heparin with MD dosing Pharmacy to continue to follow peripherally   Erin Hearing PharmD., BCPS Clinical Pharmacist 07/16/2017 7:56 AM

## 2017-07-16 NOTE — Anesthesia Preprocedure Evaluation (Addendum)
Anesthesia Evaluation  Patient identified by MRN, date of birth, ID band Patient unresponsive    Reviewed: Allergy & Precautions, NPO status , Patient's Chart, lab work & pertinent test results, reviewed documented beta blocker date and time   History of Anesthesia Complications Negative for: history of anesthetic complications  Airway Mallampati: Intubated       Dental   Pulmonary    breath sounds clear to auscultation       Cardiovascular hypertension, Pt. on medications and Pt. on home beta blockers + Peripheral Vascular Disease (asc aortiic aneurysm, now s/p repair)  (-) CAD + dysrhythmias Atrial Fibrillation and Ventricular Fibrillation  Rhythm:Regular Rate:Tachycardia  Pt is s/p AVR and prox aorta replacement, had Vfib arrest post op and RV assist device    Neuro/Psych Anxiety Depression Sedated     GI/Hepatic Neg liver ROS, GERD  ,  Endo/Other  Hypothyroidism   Renal/GU negative Renal ROSH/o stones     Musculoskeletal  (+) Arthritis ,   Abdominal   Peds  Hematology Hb 10.9, plt 200k   Anesthesia Other Findings   Reproductive/Obstetrics                            Anesthesia Physical  Anesthesia Plan  ASA: IV and emergent  Anesthesia Plan: General   Post-op Pain Management:    Induction: Intravenous  PONV Risk Score and Plan: 3 and Treatment may vary due to age or medical condition  Airway Management Planned: Oral ETT  Additional Equipment: 3D TEE  Intra-op Plan:   Post-operative Plan: Post-operative intubation/ventilation  Informed Consent: I have reviewed the patients History and Physical, chart, labs and discussed the procedure including the risks, benefits and alternatives for the proposed anesthesia with the patient or authorized representative who has indicated his/her understanding and acceptance.     Plan Discussed with: CRNA, Surgeon and  Anesthesiologist  Anesthesia Plan Comments:        Anesthesia Quick Evaluation

## 2017-07-16 NOTE — Progress Notes (Signed)
.. ..  Name: [name] MRN: [id number] DOB: [birth year]    ADMISSION DATE:  07/10/2017  CONSULTATION DATE:  07/12/17  REFERRING MD :  Nils Pyle MD  CHIEF COMPLAINT:   BRIEF PATIENT DESCRIPTION: SIGNIFICANT EVENTS  77 yr old female POD 1 TEE, Open Heart with evacuation of Hematoma and placement of RVAD.  STUDIES: Echo12/12/2016 LVEF 55-60%, Grade 1 DD, Mild/Mod AI with LVOT, Mid ascending aortic diameter 48.88 mm, Mild LAE, RV normal    TEE  BRIEF:   77 year old female with past medical history significant for fusiform ascending aortic aneurysm, hypertension, hyperlipidemia, status post AVR and replacement of ascending aortic aneurysm.  On 07/10/2017 postprocedure patient developed acute cardiogenic shock had an episode of V. fib arrest was taken back to the OR on 07/11/2017 for evacuation of hematoma and placement of RVAD.  PCCM consulted for ventilator management.   Events - 07/10/17 Admit - 07/10/17 S/p AVR and ascending AA replacement  - 07/10/17 VF Arrest in evening with shock x 1 - 07/11/17 to OR for evacuation of hematoma and RVAD placement  - 07/12/17 PCCM consult 07/15/2017 Lasix per cardiology CVP of 5      SUBJECTIVE/OVERNIGHT/INTERVAL HX Heavily sedated on the ventilator, hemodynamically stable     VITAL SIGNS: Temp:  [97.2 F (36.2 C)-99.5 F (37.5 C)] 99.1 F (37.3 C) (01/20 1100) Pulse Rate:  [86-109] 96 (01/20 1100) Resp:  [8-21] 15 (01/20 1100) BP: (86-166)/(41-73) 93/56 (01/20 1100) SpO2:  [94 %-100 %] 96 % (01/20 1100) Arterial Line BP: (98-185)/(40-66) 121/46 (01/20 1100) FiO2 (%):  [40 %-50 %] 40 % (01/20 0825) Weight:  [90 kg (198 lb 6.6 oz)] 90 kg (198 lb 6.6 oz) (01/20 0400)  PHYSICAL EXAMINATION:  General: Elderly female currently sedated in cardiogenic shock HEENT: Endotracheal tube to ventilator, orogastric tube tube feedings PSY: Not available Neuro: Heavily sedated CV: Sounds are regular rate rate and rhythm or RVAD now 2300 PULM:  Decreased breath sounds at bases, coarse rhonchi bilaterally WC:BJSE, non-tender, bsx4 active  Extremities: warm/dry, 3+ edema  Skin: no rashes or lesions sternal dressings intact     PULMONARY Recent Labs  Lab 07/14/17 0228  07/14/17 2238  07/15/17 0244 07/15/17 0815 07/15/17 1250 07/15/17 1541 07/15/17 1700 07/16/17 0420 07/16/17 0425  PHART 7.458*  --  7.516*  --  7.535* 7.529*  --   --   --   --  7.411  PCO2ART 41.9  --  39.8  --  40.5 37.4  --   --   --   --  45.5  PO2ART 63.0*  --  64.0*  --  65.0* 71.0*  --   --   --   --  78.0*  HCO3 29.8*  --  32.3*  --  34.3* 31.4*  --   --   --   --  28.8*  TCO2 31   < > 33*  --  36* 33*  --  30  --   --  30  O2SAT 93.0   < > 95.0   < > 95.0 96.0 62.0  --  63.5 57.0 95.0   < > = values in this interval not displayed.    CBC Recent Labs  Lab 07/15/17 0231 07/15/17 1539 07/15/17 1541 07/16/17 0233  HGB 8.3* 8.4* 7.5* 8.0*  HCT 25.3* 25.2* 22.0* 24.7*  WBC 9.5 10.1  --  11.0*  PLT 88* 112*  --  125*    COAGULATION Recent Labs  Lab 07/10/17 1500 07/11/17  3614 07/11/17 1037 07/11/17 1259 07/11/17 1552  INR 1.67 1.76 1.73 1.85 1.71    CARDIAC  No results for input(s): TROPONINI in the last 168 hours. No results for input(s): PROBNP in the last 168 hours.   CHEMISTRY Recent Labs  Lab 07/12/17 0323  07/12/17 1700  07/13/17 0405  07/13/17 2331 07/14/17 0217 07/14/17 1525 07/15/17 0231 07/15/17 1541 07/16/17 0233  NA 140   < >  --    < > 138   < > 136 135 133* 134* 134* 134*  K 3.7   < >  --    < > 3.8   < > 3.9 3.8 3.7 3.7 3.7 4.2  CL 106   < >  --    < > 102   < > 97* 96* 87* 95* 92* 98*  CO2 23  --   --   --  26  --  27 28  --  29  --  26  GLUCOSE 145*   < >  --    < > 118*   < > 156* 178* 117* 108* 125* 175*  BUN 18   < >  --    < > 19   < > 15 14 15 12 10 10   CREATININE 0.94   < > 0.92   < > 0.88   < > 0.78 0.75 0.70 0.62 0.50 0.58  CALCIUM 7.6*  --   --   --  6.9*  --  7.8* 7.8*  --  8.9  --  8.7*    MG 2.1  --  1.7  --   --   --   --  1.5*  --  1.5*  --  1.5*  PHOS  --   --   --   --   --   --   --  4.1  --  4.5  --  5.6*   < > = values in this interval not displayed.   Estimated Creatinine Clearance: 67.3 mL/min (by C-G formula based on SCr of 0.58 mg/dL).   LIVER Recent Labs  Lab 07/10/17 1500  07/11/17 0944 07/11/17 1037 07/11/17 1259 07/11/17 1552 07/12/17 0323 07/13/17 0405 07/14/17 0217 07/15/17 0231  AST  --    < > 151* 150*  --   --  110* 77* 49* 34  ALT  --    < > 69* 70*  --   --  50 41 32 25  ALKPHOS  --    < > 46 46  --   --  40 50 61 73  BILITOT  --    < > 2.0* 2.0*  --   --  1.3* 1.7* 1.0 1.1  PROT  --    < > 4.2* 4.2*  --   --  3.9* 4.5* 4.7* 4.4*  ALBUMIN  --    < > 3.1* 3.1*  --   --  2.9* 2.9* 2.6* 2.3*  INR 1.67  --  1.76 1.73 1.85 1.71  --   --   --   --    < > = values in this interval not displayed.     INFECTIOUS Recent Labs  Lab 07/13/17 2331 07/15/17 0231 07/16/17 0233  LATICACIDVEN 1.6  --   --   PROCALCITON  --  0.67 0.50     ENDOCRINE CBG (last 3)  Recent Labs    07/15/17 1934 07/15/17 2353 07/16/17 0351  GLUCAP 122* 164* 172*  IMAGING x48h  - image(s) personally visualized  -   highlighted in bold Dg Chest Port 1 View  Result Date: 07/16/2017 CLINICAL DATA:  77 year old female on right ventricular assist device. EXAM: PORTABLE CHEST 1 VIEW COMPARISON:  Chest x-ray 07/15/2017. FINDINGS: Patient is intubated with the tip of the endotracheal to 2.2 cm above the carina. Small bore feeding tube extends into the abdomen (tip below the lower margin of the image). Right internal jugular [place] through which a Swan-Ganz catheter has been passed into the proximal left main pulmonary artery. Large bore venous cannula extending cephalad from the IVC into the right atrium and ultimately extending into the superior vena cava. A second large bore cannula likely placed directly into the right ventricle with tip in the pulmonic  trunk. Skin staples near the midline and in the right axillary region. Bioprosthetic aortic valve. Lung volumes are low. Patchy ill-defined airspace disease noted throughout the left mid to lower lung. Bibasilar opacities compatible with areas of subsegmental atelectasis. Moderate left pleural effusion. No pneumothorax. No evidence of pulmonary edema. Cardiopericardial silhouette is enlarged. The patient is rotated to the right on today's exam, resulting in distortion of the mediastinal contours and reduced diagnostic sensitivity and specificity for mediastinal pathology. IMPRESSION: 1. Postoperative changes and support apparatus, as above. 2. Low lung volumes with bibasilar atelectasis, as well as widespread airspace consolidation throughout the left mid to lower lung and moderate left pleural effusion. Electronically Signed   By: Vinnie Langton M.D.   On: 07/16/2017 07:49   Dg Chest Port 1 View  Result Date: 07/15/2017 CLINICAL DATA:  Ventricular assist device EXAM: PORTABLE CHEST 1 VIEW COMPARISON:  Yesterday FINDINGS: Swan-Ganz catheter from the right with tip over the main pulmonary artery. Ventricular assist cannula in stable position. Chronic cardiomegaly. Aortic valve replacement. A sponge has been removed from over the left chest. Thoracic drains. Lower endotracheal tube, tip 1 cm above the carina. Feeding tube at least reaches the stomach. Left subclavian central line in stable position. Stable cardiomegaly. Stable low volume chest with haziness of the left more than right chest suggesting atelectasis and pleural fluid. No definite pneumothorax. IMPRESSION: 1. Lower endotracheal tube, tip 1 cm above the carina. 2. Otherwise stable hardware. 3. Low volume chest with asymmetric left atelectasis and/or layering pleural fluid. Electronically Signed   By: Monte Fantasia M.D.   On: 07/15/2017 07:10   Dg Abd Portable 1v  Result Date: 07/16/2017 CLINICAL DATA:  77 year old female on right ventricular  assist device. Evaluate for ileus. EXAM: PORTABLE ABDOMEN - 1 VIEW COMPARISON:  Abdominal radiograph 05/15/2018. FINDINGS: Small bore feeding tube noted with tip in the antral pre-pyloric region of the stomach. Large-bore cannula extending from the right femoral region into the IVC with tip extending above the upper margin of the image. Second large-bore cannula with tip extending toward the heart ( also extending beyond the upper margin of the image). Epicardial pacing wires are noted. Gas and stool are noted throughout the colon extending to the level of the distal rectum. No pathologic dilatation of small bowel or colon. No definite pneumoperitoneum identified on these supine images. Midline skin staples in the epigastric region extending cephalad into the thorax. Surgical clips project over the right upper quadrant of the abdomen from prior cholecystectomy. IMPRESSION: 1. Support apparatus and postoperative changes, as above. 2. Nonobstructive bowel gas pattern. No pneumoperitoneum. Bowel gas pattern does not suggest ileus. Electronically Signed   By: Vinnie Langton M.D.   On: 07/16/2017 07:52  Dg Abd Portable 1v  Result Date: 07/15/2017 CLINICAL DATA:  Encounter for feeding tube placement. EXAM: PORTABLE ABDOMEN - 1 VIEW COMPARISON:  07/13/2017. chest radiograph 07/15/2017 FINDINGS: Feeding tube is extending to the abdomen. The tip is in the distal stomach region. Feeding tube location has not significantly changed since 07/13/2017. Consolidation or volume loss at the left lung base. Again noted is a ventricular assist device. Swan-Ganz catheter extends into the main pulmonary artery. Aortic valve prosthesis. IMPRESSION: Feeding tube tip in the distal stomach region. Electronically Signed   By: Markus Daft M.D.   On: 07/15/2017 10:53        ASSESSMENT / PLAN: 77 year old female with PMHx significant for fusiform ascending aortic aneurysm, hypertension, hyperlipidemia, status post AVR and  replacement of ascending aortic aneurysm.  On 07/10/2017 postprocedure patient developed acute cardiogenic shock had an episode of V. fib arrest was taken back to the OR on 07/11/2017 for evacuation of hematoma and placement of RVAD.   ASSESSMENT / PLAN:  PULMONARY A: #current: acute resp failure   07/16/2017 -> acute resp faiure - does not meet sbt criteria  P:   Full vent support Wean FIO2 as able Sat goals are > 94% CXR daily ABG prn  CARDIOVASCULAR A:  Atrial fibrillation per tele #current circulatyr cardiogenic shock 1/18>>[place] 65.4%  on Norepi 8, Epi 8, and Milrinone 0.25. CVP 8-9 07/16/2017 -> multiple pressors and ionotrpes and amio RVAD Settings Per CV TS and cardiology P:  Continue Amio Per CVTS Diuresis per cardiology Mag and K repletion per cards  No CPR for now  2/2 open chest  RENAL  Intake/Output Summary (Last 24 hours) at 07/16/2017 1125 Last data filed at 07/16/2017 1100 Gross per 24 hour  Intake 6947.08 ml  Output 2500 ml  Net 4447.08 ml   Recent Labs  Lab 07/14/17 0217 07/14/17 1525 07/15/17 0231 07/15/17 1541 07/16/17 0233  CREATININE 0.75 0.70 0.62 0.50 0.58   Filed Weights   07/14/17 0200 07/15/17 0300 07/16/17 0400  Weight: 89.4 kg (197 lb 1.5 oz) 86.9 kg (191 lb 9.3 oz) 90 kg (198 lb 6.6 oz)     A:    low calciuum Good UO Lasix gtt decreased to 4 mg/ hr per cards Mag goal > 2, K goal > 4  P:  Replete calcium currently on calcium drip Replete mag and K per cards Maintain renal perfusion Avoid nephrotoxic medications Monitor UO  GASTROINTESTINAL A:   On TF. Mil transaminitis  P:   Continue TF Hold tylenol   HEMATOLOGIC Recent Labs  Lab 07/15/17 0231 07/15/17 1539 07/15/17 1541 07/16/17 0233  HGB 8.3* 8.4* 7.5* 8.0*  HCT 25.3* 25.2* 22.0* 24.7*  WBC 9.5 10.1  --  11.0*  PLT 88* 112*  --  125*    A:   #RBC: anemia of criticla illness #Platelet thrombocytopenia - noted on IV heparin but proifile does not fitt  HITT #WBC improving leukocytosis  P:  -Transfuse per protocol CBC daily Monitor for active bleeding Heparin per CVTS   INFECTIOUS Recent Labs  Lab 07/15/17 0231 07/16/17 0233  PROCALCITON 0.67 0.50     A:   On empiric antibiotics 117 Fortaz 115 vancomycin 1/18 CXR with? infiltrates P:   Follow WBC and fever curve [employer] 07/15/2017 pro-calcitonin 0.6, 22,019 Pro-calcitonin 0.50 Re-culture as is clinically indicated  ENDOCRINE CBG (last 3)  Recent Labs    07/15/17 1934 07/15/17 2353 07/16/17 0351  GLUCAP 122* 164* 172*   A:  At risk hyperglycemia P:   ICU hyperglycemia protocol CBG Q 4 Sliding scale insulin  NEUROLOGIC A:   Sedated RASS Goal -4  07/16/2017 -> RASS -4 on sedation gtt Remains maxed on Precedex, 100 Fentanyl, 2 versed P:   RASS goal: -3 with RVAD Fent gtt Versed gtt Continue Precedex   FAMILY  - Updates 07/16/2017 no family at bedside  - Inter-disciplinary family meet or Palliative Care meeting due by:  DAy 7. Current LOS is LOS 6 days  P CCM adding very little at this point.  DISPO ICU on life support.  My CCT 40 min  Richardson Landry Minor ACNP Maryanna Shape PCCM Pager [phone] till 1 pm If no answer page [phone] 07/16/2017, 11:25 AM

## 2017-07-16 NOTE — Progress Notes (Signed)
Advanced Heart Failure Rounding Note  Primary Cardiologist: Dr. Percival Spanish (2014) HF: (New) Dr. Haroldine Laws   Subjective:    Events - 07/10/17 S/p AVR and ascending AA replacement  - 07/10/17 VF Arrest in evening with shock x 1 - 07/11/17 RVAD placement   Remains intubated and sedated. On 40% FiO2.   RVAD at 2300 with 3L flow.   BPs soft overnight. Now on epi 8, NE 20 and milrinone 0.25  MAPs 65-70 Hgb 8.0   PAP 28/14 with goo pulsativity  CVP 5-6  Co-ox 57%  Wight up 7 pounds. Off diuretics.   CXR with improved aeration but ATX in LLL (Personally reviewed)   Echo 06/02/2017 LVEF 55-60%, Grade 1 DD, Mild/Mod AI with LVOT, Mid ascending aortic diameter 48.88 mm, Mild LAE, RV normal  Objective:   Weight Range: 90 kg (198 lb 6.6 oz) Body mass index is 33.02 kg/m.   Vital Signs:   Temp:  [97.2 F (36.2 C)-99.5 F (37.5 C)] 99 F (37.2 C) (01/20 1145) Pulse Rate:  [87-109] 96 (01/20 1145) Resp:  [4-21] 15 (01/20 1145) BP: (86-166)/(41-73) 93/56 (01/20 1100) SpO2:  [94 %-100 %] 95 % (01/20 1145) Arterial Line BP: (98-185)/(40-66) 114/43 (01/20 1145) FiO2 (%):  [40 %-50 %] 40 % (01/20 0825) Weight:  [90 kg (198 lb 6.6 oz)] 90 kg (198 lb 6.6 oz) (01/20 0400) Last BM Date: (PTA)  Weight change: Filed Weights   07/14/17 0200 07/15/17 0300 07/16/17 0400  Weight: 89.4 kg (197 lb 1.5 oz) 86.9 kg (191 lb 9.3 oz) 90 kg (198 lb 6.6 oz)    Intake/Output:   Intake/Output Summary (Last 24 hours) at 07/16/2017 1158 Last data filed at 07/16/2017 1100 Gross per 24 hour  Intake 6847.08 ml  Output 2500 ml  Net 4347.08 ml    Physical Exam    General:  Intubated sedated HEENT: normal +ETT Neck: supple.RIJ swan . Carotids 2+ bilat; no bruits. No lymphadenopathy or thryomegaly appreciated. Cor: Dressing ok. RVAD hum. PA cannula site ok CTs ok  Lungs: clear anteriorly  Abdomen: obese soft, nontender, nondistended. No hepatosplenomegaly. No bruits or masses. Good bowel  sounds. Extremities: no cyanosis, clubbing, rash, 1+ edema RFV cannula ok  Neuro: intubated sedated  Telemetry   NSR 90-100, personally reviewed.   EKG    No new tracings.    Labs    CBC Recent Labs    07/15/17 0231 07/15/17 1539 07/15/17 1541 07/16/17 0233  WBC 9.5 10.1  --  11.0*  NEUTROABS 6.2  --   --  7.5  HGB 8.3* 8.4* 7.5* 8.0*  HCT 25.3* 25.2* 22.0* 24.7*  MCV 85.2 86.0  --  87.0  PLT 88* 112*  --  546*   Basic Metabolic Panel Recent Labs    07/15/17 0231 07/15/17 1541 07/16/17 0233  NA 134* 134* 134*  K 3.7 3.7 4.2  CL 95* 92* 98*  CO2 29  --  26  GLUCOSE 108* 125* 175*  BUN _0 CREATININE 0.62 0.50 0.58  CALCIUM 8.9  --  8.7*  MG 1.5*  --  1.5*  PHOS 4.5  --  5.6*   Liver Function Tests Recent Labs    07/14/17 0217 07/15/17 0231  AST 49* 34  ALT 32 25  ALKPHOS 61 73  BILITOT 1.0 1.1  PROT 4.7* 4.4*  ALBUMIN 2.6* 2.3*   No results for input(s): LIPASE, AMYLASE in the last 72 hours. Cardiac Enzymes No results for input(s): CKTOTAL,  CKMB, CKMBINDEX, TROPONINI in the last 72 hours.  BNP: BNP (last 3 results) No results for input(s): BNP in the last 8760 hours.  ProBNP (last 3 results) No results for input(s): PROBNP in the last 8760 hours.   D-Dimer No results for input(s): DDIMER in the last 72 hours. Hemoglobin A1C No results for input(s): HGBA1C in the last 72 hours. Fasting Lipid Panel No results for input(s): CHOL, HDL, LDLCALC, TRIG, CHOLHDL, LDLDIRECT in the last 72 hours. Thyroid Function Tests No results for input(s): TSH, T4TOTAL, T3FREE, THYROIDAB in the last 72 hours.  Invalid input(s): FREET3  Other results:   Imaging    Dg Chest Port 1 View  Result Date: 07/16/2017 CLINICAL DATA:  77 year old female on right ventricular assist device. EXAM: PORTABLE CHEST 1 VIEW COMPARISON:  Chest x-ray 07/15/2017. FINDINGS: Patient is intubated with the tip of the endotracheal to 2.2 cm above the carina. Small bore  feeding tube extends into the abdomen (tip below the lower margin of the image). Right internal jugular Cordis through which a Swan-Ganz catheter has been passed into the proximal left main pulmonary artery. Large bore venous cannula extending cephalad from the IVC into the right atrium and ultimately extending into the superior vena cava. A second large bore cannula likely placed directly into the right ventricle with tip in the pulmonic trunk. Skin staples near the midline and in the right axillary region. Bioprosthetic aortic valve. Lung volumes are low. Patchy ill-defined airspace disease noted throughout the left mid to lower lung. Bibasilar opacities compatible with areas of subsegmental atelectasis. Moderate left pleural effusion. No pneumothorax. No evidence of pulmonary edema. Cardiopericardial silhouette is enlarged. The patient is rotated to the right on today's exam, resulting in distortion of the mediastinal contours and reduced diagnostic sensitivity and specificity for mediastinal pathology. IMPRESSION: 1. Postoperative changes and support apparatus, as above. 2. Low lung volumes with bibasilar atelectasis, as well as widespread airspace consolidation throughout the left mid to lower lung and moderate left pleural effusion. Electronically Signed   By: Vinnie Langton M.D.   On: 07/16/2017 07:49   Dg Abd Portable 1v  Result Date: 07/16/2017 CLINICAL DATA:  76 year old female on right ventricular assist device. Evaluate for ileus. EXAM: PORTABLE ABDOMEN - 1 VIEW COMPARISON:  Abdominal radiograph 05/15/2018. FINDINGS: Small bore feeding tube noted with tip in the antral pre-pyloric region of the stomach. Large-bore cannula extending from the right femoral region into the IVC with tip extending above the upper margin of the image. Second large-bore cannula with tip extending toward the heart ( also extending beyond the upper margin of the image). Epicardial pacing wires are noted. Gas and stool are  noted throughout the colon extending to the level of the distal rectum. No pathologic dilatation of small bowel or colon. No definite pneumoperitoneum identified on these supine images. Midline skin staples in the epigastric region extending cephalad into the thorax. Surgical clips project over the right upper quadrant of the abdomen from prior cholecystectomy. IMPRESSION: 1. Support apparatus and postoperative changes, as above. 2. Nonobstructive bowel gas pattern. No pneumoperitoneum. Bowel gas pattern does not suggest ileus. Electronically Signed   By: Vinnie Langton M.D.   On: 07/16/2017 07:52    Medications:    Scheduled Medications: . aspirin EC  325 mg Oral Daily   Or  . aspirin  324 mg Per Tube Daily  . bisacodyl  5 mg Oral Once  . bisacodyl  10 mg Rectal Daily  . [START ON 07/17/2017] chlorhexidine  15 mL Mouth/Throat Once  . chlorhexidine gluconate (MEDLINE KIT)  15 mL Mouth Rinse BID  . Chlorhexidine Gluconate Cloth  6 each Topical Q0600  . Chlorhexidine Gluconate Cloth  6 each Topical Once   And  . Chlorhexidine Gluconate Cloth  6 each Topical Once  . docusate  200 mg Per Tube Daily  . fentaNYL (SUBLIMAZE) injection  50 mcg Intravenous Once  . furosemide  20 mg Intravenous Once  . insulin aspart  0-24 Units Subcutaneous Q4H  . insulin detemir  12 Units Subcutaneous BID  . latanoprost  1 drop Right Eye QHS  . levothyroxine  100 mcg Oral QODAY  . levothyroxine  112 mcg Oral QODAY  . mouth rinse  15 mL Mouth Rinse 10 times per day  . metoCLOPramide (REGLAN) injection  10 mg Intravenous Q6H  . metoprolol tartrate  12.5 mg Oral BID   Or  . metoprolol tartrate  12.5 mg Per Tube BID  . mupirocin ointment  1 application Topical BID  . pantoprazole (PROTONIX) IV  40 mg Intravenous Q24H  . venlafaxine XR  75 mg Oral Daily    Infusions: . sodium chloride 10 mL/hr at 07/16/17 0800  . sodium chloride Stopped (07/12/17 0849)  . sodium chloride    . amiodarone 30 mg/hr (07/16/17  0800)  . calcium gluconate infusion(930 mg elemental calcium/L) 0.5 mg elemental calcium/kg/hr (07/16/17 0800)  . cefTAZidime (FORTAZ)  IV Stopped (07/16/17 3662)  . dexmedetomidine 1.2 mcg/kg/hr (07/16/17 0815)  . EPINEPHrine 4 mg in dextrose 5% 250 mL infusion (16 mcg/mL) 8 mcg/min (07/16/17 0800)  . feeding supplement (VITAL 1.5 CAL) 1,000 mL (07/16/17 0800)  . fentaNYL 250 mcg/hr (07/16/17 1140)  . heparin 1,000 Units/hr (07/16/17 0800)  . lactated ringers    . lactated ringers 10 mL/hr at 07/16/17 0800  . midazolam (VERSED) infusion 2 mg/hr (07/16/17 0800)  . milrinone 0.25 mcg/kg/min (07/16/17 0849)  . nitroGLYCERIN Stopped (07/11/17 1046)  . norepinephrine (LEVOPHED) Adult infusion 19 mcg/min (07/16/17 1145)  . vancomycin Stopped (07/15/17 2125)  . vasopressin (PITRESSIN) infusion - *FOR SHOCK*      PRN Medications: sodium chloride, Place/Maintain arterial line **AND** sodium chloride, bisacodyl, docusate, fentaNYL, lactated ringers, metoprolol tartrate, midazolam, ondansetron (ZOFRAN) IV, polyethylene glycol, sodium chloride flush, vasopressin (PITRESSIN) infusion - *FOR SHOCK*    Patient Profile   [name] is a 77 y.o. female with history of fusiform ascending aortic aneurysm, HTN, and HLD.   Admitted 07/10/17 for planned AVR and replacement of ascending aortic aneurysm. Developed acute cardiogenic shock + VF arrest. Taken back to OR am of 07/11/17 for evacuation of hematoma and placement of RVAD.   Assessment/Plan   1. Cardiogenic shock s/p cardiac surgery with R>L CHF. - Pre-op Echo 06/02/2017 LVEF 55-60%, Grade 1 DD, Mild/Mod AI with LVOT, Mid ascending aortic diameter 48.88 mm, Mild LAE, RV normal - Improving with RVAD support. Flows look good with good pulsativity in PA line - Will cut RVAD to 2.5L support (200RPM) - Continue pressors. Will give 1u RBC this am. Hopefully can wean NE a bit  - Plan RVAD explant and chest closure in am. D/w Dr. [name] at bedside   - Volume status going back up. Can give lasix this afternoon after blood. KEEP CVP 8-10  - Continue heparin. PLTs stable  2. VF arrest - Stable. No further arrythmia.  - Goal K > 4.0 and Mg > 2.0. Supp aggressively. Mg 1.5 will give 4g 3. Atrial fibrillation - Remains  in NSR on IV amiodarone. - Follow electrolytes 4. Acute respiratory failure - Intubated and sedated. - Vent settings stable  Managed by CCM CXR ok.  5. S/p AVR and Ascending aortic aneurysm replacement. - Per surgery. No change. 6. AKI - Resolved 7. ABLA - Hgb down slightly Transfuse  1u  CRITICAL CARE Performed by: Glori Bickers  Total critical care time: 35 minutes  Critical care time was exclusive of separately billable procedures and treating other patients.  Critical care was necessary to treat or prevent imminent or life-threatening deterioration.  Critical care was time spent personally by me (independent of midlevel providers or residents) on the following activities: development of treatment plan with patient and/or surrogate as well as nursing, discussions with consultants, evaluation of patient's response to treatment, examination of patient, obtaining history from patient or surrogate, ordering and performing treatments and interventions, ordering and review of laboratory studies, ordering and review of radiographic studies, pulse oximetry and re-evaluation of patient's condition.     Length of Stay: 6  Glori Bickers, MD  11:58 AM Advanced Heart Failure Team Pager [phone] (M-F; 7a - 4p)  Please contact [facility] for night-coverage after hours (4p -7a ) and weekends on [email or web address]

## 2017-07-16 NOTE — Op Note (Signed)
NAMECATHALEEN, [name] NO.:  [id number]  MEDICAL RECORD NO.:  [id number]  LOCATION:  2H05C                        FACILITY:  [facility]  PHYSICIAN:  Ivin Poot, M.D.  DATE OF BIRTH:  Jun 17, 1941  DATE OF PROCEDURE:  07/11/2017 DATE OF DISCHARGE:                              OPERATIVE REPORT   OPERATIONS: 1. Mediastinal exploration after resection and grafting of ascending     aneurysm with aortic valve replacement. 2. Implantation of percutaneous right ventricular assist device with     CentriMag blood pump and cannulas (7-French arterial cannula to the     main pulmonary artery and a 23/25 venous drainage cannula placed     from the right femoral vein to the right atrium).  SURGEON:  Ivin Poot, MD.  ASSISTANT:  John Giovanni, PA-C.  ANESTHESIA:  General.  INDICATIONS:  The patient is a 77 year old female who had previously undergone resection and grafting of a large ascending aneurysm 5.2 cm with combined aortic valve replacement for moderate aortic insufficiency 24 hours previously.  She returned return from the operating room in stable condition with normal biventricular function and minimal tricuspid regurgitation on postoperative echo.  She did well for 12 hours, but then developed hemodynamic lability requiring increased dose of pressors.  Her urine output decreased and there was concern over tamponade since she had postoperative coagulopathy.  She was brought back to the operating room for reexploration, to remove any blood or hematoma on the right ventricle and a washout.  At the time of reexploration, minimal mediastinal blood was found, however, the RV function was significantly decreased from her earlier postoperative status.  For that reason, a right ventricular assist device was implanted.  DESCRIPTION OF PROCEDURE:  The patient was brought directly from the ICU to the operating room.  Preoperative consent from the family had  been obtained.  The patient was stable and transferred from the ICU to the operating room.  She was placed supine on the operating table and the previously placed chest tube was removed.  The chest, abdomen, and legs to the knees were prepped and draped as a sterile field.  A transesophageal echo probe was placed by the Anesthesia team.  This showed reduced LV function with moderate tricuspid regurgitation.  She was prepped and draped as a sterile field.  A proper time-out was performed.  The sternal incision was opened and the sternal wires were removed.  There was a small amount of clotted and non-clotted blood in the mediastinum.  There was no evidence of tamponade, however, the RV contractility was significantly reduced with only the septal portion of the RV contracting.  There was also significant new tricuspid regurgitation.  The patient was given 4000 units of heparin and pursestrings were placed in the right ventricular outflow tract and the right common femoral vein was cannulated with a 5-French micro sheath. A guidewire was passed from the common femoral vein into the right atrium.  The ACT was documented as being therapeutic.  The 23/25-French venous cannula was placed percutaneously into the right atrium with the tip in the SVC.  This was guided with echocardiogram images.  The CentriMag pump  circuit was then set up and the surgical lines brought to the sterile operative field.  Through the double pursestring in the RV outflow tract, a 7-French lighthouse tipped cannula was passed through the pulmonic valve into the main pulmonary artery.  The pursestrings were secured with a keeper- tourniquet.  This cannula was connected to the inflow circuit of the CentriMag pump.  The clamps on the circuit were then removed and the CentriMag pump was then initiated with flows increasing from 0.5 L/min to 1 L/min slowly up to 3.5 L/min at a speed of 2600 RPMs.  This resulted in  immediate improvement in hemodynamics, a drop in CVP and improvement in tricuspid regurgitation.  Cardiac index improved from 1.4 to 1.9.  The mediastinum was then irrigated.  Because of the cannulas in the chest and RV dysfunction, it was decided not to close the sternum.  The soft tissue above the sternum was then closed with interrupted #1 Vicryl sutures.  The subcutaneous layer was closed with a running Vicryl and skin was closed with a subcuticular Vicryl.  Sterile dressings were applied.  The patient remained stable and was then transported back to the ICU on the CentriMag right ventricular assist device.  The patient did not require cardiopulmonary bypass for placement of the pump.     Ivin Poot, M.D.     PV/MEDQ  D:  07/15/2017  T:  07/16/2017  Job:  [id number]

## 2017-07-16 NOTE — Progress Notes (Signed)
Advanced Heart Failure Rounding Note  Primary Cardiologist: Dr. Percival Spanish (2014) HF: (New) Dr. Haroldine Laws   Subjective:    Events - 07/10/17 S/p AVR and ascending AA replacement  - 07/10/17 VF Arrest in evening with shock x 1 - 07/11/17 RVAD placement   Remains intubated and sedated. Good diuresis with IV lasix. FiO 2 improving.   RVAD - 2600 rpm, 3.4-3.5 L of flow.  Echo 06/02/2017 LVEF 55-60%, Grade 1 DD, Mild/Mod AI with LVOT, Mid ascending aortic diameter 48.88 mm, Mild LAE, RV normal  Objective:   Weight Range: 90 kg (198 lb 6.6 oz) Body mass index is 33.02 kg/m.   Vital Signs:   Temp:  [97.2 F (36.2 C)-99.5 F (37.5 C)] 99 F (37.2 C) (01/20 1145) Pulse Rate:  [87-109] 96 (01/20 1145) Resp:  [4-21] 15 (01/20 1145) BP: (86-166)/(41-73) 93/56 (01/20 1100) SpO2:  [94 %-100 %] 95 % (01/20 1145) Arterial Line BP: (98-185)/(40-66) 114/43 (01/20 1145) FiO2 (%):  [40 %-50 %] 40 % (01/20 0825) Weight:  [90 kg (198 lb 6.6 oz)] 90 kg (198 lb 6.6 oz) (01/20 0400) Last BM Date: (PTA)  Weight change: Filed Weights   07/14/17 0200 07/15/17 0300 07/16/17 0400  Weight: 89.4 kg (197 lb 1.5 oz) 86.9 kg (191 lb 9.3 oz) 90 kg (198 lb 6.6 oz)    Intake/Output:   Intake/Output Summary (Last 24 hours) at 07/16/2017 1150 Last data filed at 07/16/2017 1100 Gross per 24 hour  Intake 6847.08 ml  Output 2500 ml  Net 4347.08 ml    Physical Exam    General:  Intubated/sedate HEENT: normal ETT Neck: supple.RIJ swan Carotids 2+ bilat; no bruits. No lymphadenopathy or thryomegaly appreciated. Cor: Dressing ok Cor RRR + Rvad hum + PA cannula and CTs ok  Lungs: + mechanical breath sounds  Abdomen: obese soft, nontender, nondistended. No hepatosplenomegaly. No bruits or masses. Good bowel sounds. Extremities: no cyanosis, clubbing, rash, 1+ edema RFV cannula ok  Neuro: intubated sedated   Telemetry   NSR 80-90s Personally reviewed   EKG    No new tracings.  Labs     CBC Recent Labs    07/15/17 0231 07/15/17 1539 07/15/17 1541 07/16/17 0233  WBC 9.5 10.1  --  11.0*  NEUTROABS 6.2  --   --  7.5  HGB 8.3* 8.4* 7.5* 8.0*  HCT 25.3* 25.2* 22.0* 24.7*  MCV 85.2 86.0  --  87.0  PLT 88* 112*  --  329*   Basic Metabolic Panel Recent Labs    07/15/17 0231 07/15/17 1541 07/16/17 0233  NA 134* 134* 134*  K 3.7 3.7 4.2  CL 95* 92* 98*  CO2 29  --  26  GLUCOSE 108* 125* 175*  BUN 12 10 10   CREATININE 0.62 0.50 0.58  CALCIUM 8.9  --  8.7*  MG 1.5*  --  1.5*  PHOS 4.5  --  5.6*   Liver Function Tests Recent Labs    07/14/17 0217 07/15/17 0231  AST 49* 34  ALT 32 25  ALKPHOS 61 73  BILITOT 1.0 1.1  PROT 4.7* 4.4*  ALBUMIN 2.6* 2.3*   No results for input(s): LIPASE, AMYLASE in the last 72 hours. Cardiac Enzymes No results for input(s): CKTOTAL, CKMB, CKMBINDEX, TROPONINI in the last 72 hours.  BNP: BNP (last 3 results) No results for input(s): BNP in the last 8760 hours.  ProBNP (last 3 results) No results for input(s): PROBNP in the last 8760 hours.   D-Dimer No  results for input(s): DDIMER in the last 72 hours. Hemoglobin A1C No results for input(s): HGBA1C in the last 72 hours. Fasting Lipid Panel No results for input(s): CHOL, HDL, LDLCALC, TRIG, CHOLHDL, LDLDIRECT in the last 72 hours. Thyroid Function Tests No results for input(s): TSH, T4TOTAL, T3FREE, THYROIDAB in the last 72 hours.  Invalid input(s): FREET3  Other results:   Imaging    Dg Chest Port 1 View  Result Date: 07/16/2017 CLINICAL DATA:  77 year old female on right ventricular assist device. EXAM: PORTABLE CHEST 1 VIEW COMPARISON:  Chest x-ray 07/15/2017. FINDINGS: Patient is intubated with the tip of the endotracheal to 2.2 cm above the carina. Small bore feeding tube extends into the abdomen (tip below the lower margin of the image). Right internal jugular Cordis through which a Swan-Ganz catheter has been passed into the proximal left main pulmonary  artery. Large bore venous cannula extending cephalad from the IVC into the right atrium and ultimately extending into the superior vena cava. A second large bore cannula likely placed directly into the right ventricle with tip in the pulmonic trunk. Skin staples near the midline and in the right axillary region. Bioprosthetic aortic valve. Lung volumes are low. Patchy ill-defined airspace disease noted throughout the left mid to lower lung. Bibasilar opacities compatible with areas of subsegmental atelectasis. Moderate left pleural effusion. No pneumothorax. No evidence of pulmonary edema. Cardiopericardial silhouette is enlarged. The patient is rotated to the right on today's exam, resulting in distortion of the mediastinal contours and reduced diagnostic sensitivity and specificity for mediastinal pathology. IMPRESSION: 1. Postoperative changes and support apparatus, as above. 2. Low lung volumes with bibasilar atelectasis, as well as widespread airspace consolidation throughout the left mid to lower lung and moderate left pleural effusion. Electronically Signed   By: Vinnie Langton M.D.   On: 07/16/2017 07:49   Dg Abd Portable 1v  Result Date: 07/16/2017 CLINICAL DATA:  77 year old female on right ventricular assist device. Evaluate for ileus. EXAM: PORTABLE ABDOMEN - 1 VIEW COMPARISON:  Abdominal radiograph 05/15/2018. FINDINGS: Small bore feeding tube noted with tip in the antral pre-pyloric region of the stomach. Large-bore cannula extending from the right femoral region into the IVC with tip extending above the upper margin of the image. Second large-bore cannula with tip extending toward the heart ( also extending beyond the upper margin of the image). Epicardial pacing wires are noted. Gas and stool are noted throughout the colon extending to the level of the distal rectum. No pathologic dilatation of small bowel or colon. No definite pneumoperitoneum identified on these supine images. Midline skin  staples in the epigastric region extending cephalad into the thorax. Surgical clips project over the right upper quadrant of the abdomen from prior cholecystectomy. IMPRESSION: 1. Support apparatus and postoperative changes, as above. 2. Nonobstructive bowel gas pattern. No pneumoperitoneum. Bowel gas pattern does not suggest ileus. Electronically Signed   By: Vinnie Langton M.D.   On: 07/16/2017 07:52    Medications:    Scheduled Medications: . aspirin EC  325 mg Oral Daily   Or  . aspirin  324 mg Per Tube Daily  . bisacodyl  5 mg Oral Once  . bisacodyl  10 mg Rectal Daily  . [START ON 07/17/2017] chlorhexidine  15 mL Mouth/Throat Once  . chlorhexidine gluconate (MEDLINE KIT)  15 mL Mouth Rinse BID  . Chlorhexidine Gluconate Cloth  6 each Topical Q0600  . Chlorhexidine Gluconate Cloth  6 each Topical Once   And  . Chlorhexidine  Gluconate Cloth  6 each Topical Once  . docusate  200 mg Per Tube Daily  . fentaNYL (SUBLIMAZE) injection  50 mcg Intravenous Once  . furosemide  20 mg Intravenous Once  . insulin aspart  0-24 Units Subcutaneous Q4H  . insulin detemir  12 Units Subcutaneous BID  . latanoprost  1 drop Right Eye QHS  . levothyroxine  100 mcg Oral QODAY  . levothyroxine  112 mcg Oral QODAY  . mouth rinse  15 mL Mouth Rinse 10 times per day  . metoCLOPramide (REGLAN) injection  10 mg Intravenous Q6H  . metoprolol tartrate  12.5 mg Oral BID   Or  . metoprolol tartrate  12.5 mg Per Tube BID  . mupirocin ointment  1 application Topical BID  . pantoprazole (PROTONIX) IV  40 mg Intravenous Q24H  . venlafaxine XR  75 mg Oral Daily    Infusions: . sodium chloride 10 mL/hr at 07/16/17 0800  . sodium chloride Stopped (07/12/17 0849)  . sodium chloride    . amiodarone 30 mg/hr (07/16/17 0800)  . calcium gluconate infusion(930 mg elemental calcium/L) 0.5 mg elemental calcium/kg/hr (07/16/17 0800)  . cefTAZidime (FORTAZ)  IV Stopped (07/16/17 1287)  . dexmedetomidine 1.2 mcg/kg/hr  (07/16/17 0815)  . EPINEPHrine 4 mg in dextrose 5% 250 mL infusion (16 mcg/mL) 8 mcg/min (07/16/17 0800)  . feeding supplement (VITAL 1.5 CAL) 1,000 mL (07/16/17 0800)  . fentaNYL 250 mcg/hr (07/16/17 1140)  . heparin 1,000 Units/hr (07/16/17 0800)  . lactated ringers    . lactated ringers 10 mL/hr at 07/16/17 0800  . midazolam (VERSED) infusion 2 mg/hr (07/16/17 0800)  . milrinone 0.25 mcg/kg/min (07/16/17 0849)  . nitroGLYCERIN Stopped (07/11/17 1046)  . norepinephrine (LEVOPHED) Adult infusion 20 mcg/min (07/16/17 1125)  . vancomycin Stopped (07/15/17 2125)  . vasopressin (PITRESSIN) infusion - *FOR SHOCK*      PRN Medications: sodium chloride, Place/Maintain arterial line **AND** sodium chloride, bisacodyl, docusate, fentaNYL, lactated ringers, metoprolol tartrate, midazolam, ondansetron (ZOFRAN) IV, polyethylene glycol, sodium chloride flush, vasopressin (PITRESSIN) infusion - *FOR SHOCK*    Patient Profile   RAEGYN RENDA is a 77 y.o. female with history of fusiform ascending aortic aneurysm, HTN, and HLD.   Admitted 07/10/17 for planned AVR and replacement of ascending aortic aneurysm. Developed acute cardiogenic shock + VF arrest. Taken back to OR am of 07/11/17 for evacuation of hematoma and placement of RVAD.   Assessment/Plan   1. Cardiogenic shock s/p cardiac surgery with R>L CHF. - Pre-op Echo 06/02/2017 LVEF 55-60%, Grade 1 DD, Mild/Mod AI with LVOT, Mid ascending aortic diameter 48.88 mm, Mild LAE, RV normal - Tenuous but stabilized with RVAD in place - RVAD setting 2600 with 3.5L flow. No chatter  - Vent settings improving with diuresis - Agree with lasix gtt  - D/w Dr. PVT likely to OR on Monday for RVAD explant and chest closure 2. VF arrest - Stable. No further arrythmia.  - Goal K > 4.0 and Mg > 2.0. Continue aggressive supp  3. Atrial fibrillation - Remains in NSR on IV amiodarone.  - Follow electrolytes 4. Acute respiratory failure - Intubated and  sedated.  - Vent settings improving as above 5. S/p AVR and Ascending aortic aneurysm replacement.  - Per surgery. No change.  6. AKI - Improving 7. Hypokalemia - supp as needed   CRITICAL CARE Performed by: Glori Bickers  Total critical care time: 45 minutes  Critical care time was exclusive of separately billable procedures and treating other  patients.  Critical care was necessary to treat or prevent imminent or life-threatening deterioration.  Critical care was time spent personally by me (independent of midlevel providers or residents) on the following activities: development of treatment plan with patient and/or surrogate as well as nursing, discussions with consultants, evaluation of patient's response to treatment, examination of patient, obtaining history from patient or surrogate, ordering and performing treatments and interventions, ordering and review of laboratory studies, ordering and review of radiographic studies, pulse oximetry and re-evaluation of patient's condition.    Length of Stay: [place], MD  07/16/2017, 11:50 AM  Advanced Heart Failure Team Pager [phone] (M-F; 7a - 4p)  Please contact [facility] for night-coverage after hours (4p -7a ) and weekends on [email or web address]

## 2017-07-16 NOTE — Progress Notes (Signed)
CT surgery  I met with the family today in ICU after examining the patient I discussed the plan for return to the OR tomorrow to remove cannulas for right ventricular support pump-Center mag. We will slowly reduce the flow of the RVAD after anticoagulation is increased and if hemodynamics remain adequate remove the inflow and outflow cannulas without cardio-pulmonary. The goal is to close the sternum after the decannulation.  The patients family understands the risks with this procedure including the risks of bleeding, recurrent RV failure, arrhythmia, and death. 

## 2017-07-16 NOTE — Progress Notes (Signed)
      [address]       [place] [address]             [phone]      Stable day  RVAD down to 2.5 L, co-ox= 64  Plan to wean/ remove RVAD in AM  Datra Clary C. Roxan Hockey, MD [facility] [phone]

## 2017-07-16 NOTE — Progress Notes (Signed)
5 Days Post-Op Procedure(s) (LRB): TRANSESOPHAGEAL ECHOCARDIOGRAM (TEE) EXPLORATION POST OPERATIVE OPEN HEART (N/A) PLACEMENT OF CENTRIMAG VENTRICULAR ASSIST DEVICE (Right) EVACUATION HEMATOMA (N/A) Subjective: Intubated, does wake up and follow commands  Objective: Vital signs in last 24 hours: Temp:  [97.2 F (36.2 C)-99.5 F (37.5 C)] 99.1 F (37.3 C) (01/20 0830) Pulse Rate:  [85-109] 103 (01/20 0830) Cardiac Rhythm: Sinus tachycardia (01/20 0800) Resp:  [8-21] 10 (01/20 0830) BP: (86-166)/(41-73) 103/60 (01/20 0800) SpO2:  [94 %-100 %] 98 % (01/20 0830) Arterial Line BP: (103-185)/(40-66) 126/45 (01/20 0830) FiO2 (%):  [40 %-50 %] 40 % (01/20 0825) Weight:  [198 lb 6.6 oz (90 kg)] 198 lb 6.6 oz (90 kg) (01/20 0400)  Hemodynamic parameters for last 24 hours: PAP: (25-38)/(12-24) 31/17 CVP:  [3 mmHg-14 mmHg] 10 mmHg  Intake/Output from previous day: 01/19 0701 - 01/20 0700 In: 6891.6 [I.V.:4941.6; NG/GT:1300; IV Piggyback:650] Out: 2965 [Urine:2225; Emesis/NG output:350; Chest Tube:390] Intake/Output this shift: Total I/O In: 260.8 [I.V.:210.8; NG/GT:50] Out: 70 [Urine:60; Chest Tube:10]  General appearance: intubated Neurologic: nonfocal Heart: mildly tachy, regular Lungs: rhonchi bilaterally  Lab Results: Recent Labs    07/15/17 1539 07/15/17 1541 07/16/17 0233  WBC 10.1  --  11.0*  HGB 8.4* 7.5* 8.0*  HCT 25.2* 22.0* 24.7*  PLT 112*  --  125*   BMET:  Recent Labs    07/15/17 0231 07/15/17 1541 07/16/17 0233  NA 134* 134* 134*  K 3.7 3.7 4.2  CL 95* 92* 98*  CO2 29  --  26  GLUCOSE 108* 125* 175*  BUN 12 10 10   CREATININE 0.62 0.50 0.58  CALCIUM 8.9  --  8.7*    PT/INR: No results for input(s): LABPROT, INR in the last 72 hours. ABG    Component Value Date/Time   PHART 7.411 07/16/2017 0425   HCO3 28.8 (H) 07/16/2017 0425   TCO2 30 07/16/2017 0425   ACIDBASEDEF 2.0 07/11/2017 0958   O2SAT 95.0 07/16/2017 0425   CBG (last 3)  Recent  Labs    07/15/17 1934 07/15/17 2353 07/16/17 0351  GLUCAP 122* 164* 172*    Assessment/Plan: S/P Procedure(s) (LRB): TRANSESOPHAGEAL ECHOCARDIOGRAM (TEE) EXPLORATION POST OPERATIVE OPEN HEART (N/A) PLACEMENT OF CENTRIMAG VENTRICULAR ASSIST DEVICE (Right) EVACUATION HEMATOMA (N/A) remains critically ill  CV- RVAD flow decreased to 3 L/min yesterday- co-ox down a little to 57 and required increase levophed overnight.  Scheduled for RVAD wean tomorrow  RESP- VDRF. Some left lung atelectasis + probable effusion  RENAL- creatinine and lytes ok, + yesterday after being negative prior to that  ENDO- CBG reasonably well controlled under the circumstances  Nutrition- on tube feedings  ID- afebrile on vancomycin   LOS: 6 days    [name] 07/16/2017

## 2017-07-17 ENCOUNTER — Inpatient Hospital Stay (HOSPITAL_COMMUNITY): Payer: Medicare Other

## 2017-07-17 ENCOUNTER — Inpatient Hospital Stay (HOSPITAL_COMMUNITY): Payer: Medicare Other | Admitting: Certified Registered Nurse Anesthetist

## 2017-07-17 ENCOUNTER — Encounter (HOSPITAL_COMMUNITY)
Admission: RE | Disposition: A | Discharge status: Rehabilitation Facility | Payer: Self-pay | Source: Ambulatory Visit | Attending: Cardiothoracic Surgery

## 2017-07-17 DIAGNOSIS — J810 Acute pulmonary edema: Secondary | ICD-10-CM

## 2017-07-17 DIAGNOSIS — I509 Heart failure, unspecified: Secondary | ICD-10-CM

## 2017-07-17 DIAGNOSIS — G9340 Encephalopathy, unspecified: Secondary | ICD-10-CM

## 2017-07-17 HISTORY — PX: TEE WITHOUT CARDIOVERSION: SHX5443

## 2017-07-17 HISTORY — PX: REMOVAL OF CENTRIMAG VENTRICULAR ASSIST DEVICE: SHX6220

## 2017-07-17 LAB — CREATININE, SERUM
Creatinine, Ser: 0.55 mg/dL (ref 0.44–1.00)
GFR calc Af Amer: >60 mL/min (ref >60)
GFR calc non Af Amer: >60 mL/min (ref >60)

## 2017-07-17 LAB — POCT I-STAT, CHEM 8
BUN: 8 mg/dL (ref 6–20)
BUN: 8 mg/dL (ref 6–20)
BUN: 8 mg/dL (ref 6–20)
BUN: 8 mg/dL (ref 6–20)
BUN: 11 mg/dL (ref 6–20)
Calcium, Ion: 1.16 mmol/L (ref 1.15–1.40)
Calcium, Ion: 1.15 mmol/L (ref 1.15–1.40)
Calcium, Ion: 1.18 mmol/L (ref 1.15–1.40)
Calcium, Ion: 1.12 mmol/L — ABNORMAL LOW (ref 1.15–1.40)
Calcium, Ion: 1.07 mmol/L — ABNORMAL LOW (ref 1.15–1.40)
Chloride: 93 mmol/L — ABNORMAL LOW (ref 101–111)
Chloride: 94 mmol/L — ABNORMAL LOW (ref 101–111)
Chloride: 96 mmol/L — ABNORMAL LOW (ref 101–111)
Chloride: 96 mmol/L — ABNORMAL LOW (ref 101–111)
Chloride: 98 mmol/L — ABNORMAL LOW (ref 101–111)
Creatinine, Ser: 0.5 mg/dL (ref 0.44–1.00)
Creatinine, Ser: 0.4 mg/dL — ABNORMAL LOW (ref 0.44–1.00)
Creatinine, Ser: 0.5 mg/dL (ref 0.44–1.00)
Creatinine, Ser: 0.5 mg/dL (ref 0.44–1.00)
Creatinine, Ser: 0.5 mg/dL (ref 0.44–1.00)
Glucose, Bld: 125 mg/dL — ABNORMAL HIGH (ref 65–99)
Glucose, Bld: 136 mg/dL — ABNORMAL HIGH (ref 65–99)
Glucose, Bld: 142 mg/dL — ABNORMAL HIGH (ref 65–99)
Glucose, Bld: 113 mg/dL — ABNORMAL HIGH (ref 65–99)
Glucose, Bld: 135 mg/dL — ABNORMAL HIGH (ref 65–99)
HCT: 25 % — ABNORMAL LOW (ref 36.0–46.0)
HCT: 24 % — ABNORMAL LOW (ref 36.0–46.0)
HCT: 27 % — ABNORMAL LOW (ref 36.0–46.0)
HCT: 22 % — ABNORMAL LOW (ref 36.0–46.0)
HCT: 28 % — ABNORMAL LOW (ref 36.0–46.0)
Hemoglobin: 8.5 g/dL — ABNORMAL LOW (ref 12.0–15.0)
Hemoglobin: 8.2 g/dL — ABNORMAL LOW (ref 12.0–15.0)
Hemoglobin: 9.2 g/dL — ABNORMAL LOW (ref 12.0–15.0)
Hemoglobin: 7.5 g/dL — ABNORMAL LOW (ref 12.0–15.0)
Hemoglobin: 9.5 g/dL — ABNORMAL LOW (ref 12.0–15.0)
Potassium: 3.5 mmol/L (ref 3.5–5.1)
Potassium: 3.5 mmol/L (ref 3.5–5.1)
Potassium: 3.7 mmol/L (ref 3.5–5.1)
Potassium: 3.4 mmol/L — ABNORMAL LOW (ref 3.5–5.1)
Potassium: 4.3 mmol/L (ref 3.5–5.1)
Sodium: 134 mmol/L — ABNORMAL LOW (ref 135–145)
Sodium: 134 mmol/L — ABNORMAL LOW (ref 135–145)
Sodium: 135 mmol/L (ref 135–145)
Sodium: 136 mmol/L (ref 135–145)
Sodium: 134 mmol/L — ABNORMAL LOW (ref 135–145)
TCO2: 28 mmol/L (ref 22–32)
TCO2: 26 mmol/L (ref 22–32)
TCO2: 27 mmol/L (ref 22–32)
TCO2: 28 mmol/L (ref 22–32)
TCO2: 24 mmol/L (ref 22–32)

## 2017-07-17 LAB — GLUCOSE, CAPILLARY
Glucose-Capillary: 132 mg/dL — ABNORMAL HIGH (ref 65–99)
Glucose-Capillary: 123 mg/dL — ABNORMAL HIGH (ref 65–99)
Glucose-Capillary: 132 mg/dL — ABNORMAL HIGH (ref 65–99)
Glucose-Capillary: 141 mg/dL — ABNORMAL HIGH (ref 65–99)
Glucose-Capillary: 146 mg/dL — ABNORMAL HIGH (ref 65–99)
Glucose-Capillary: 141 mg/dL — ABNORMAL HIGH (ref 65–99)
Glucose-Capillary: 119 mg/dL — ABNORMAL HIGH (ref 65–99)
Glucose-Capillary: 124 mg/dL — ABNORMAL HIGH (ref 65–99)
Glucose-Capillary: 127 mg/dL — ABNORMAL HIGH (ref 65–99)
Glucose-Capillary: 134 mg/dL — ABNORMAL HIGH (ref 65–99)
Glucose-Capillary: 124 mg/dL — ABNORMAL HIGH (ref 65–99)
Glucose-Capillary: 121 mg/dL — ABNORMAL HIGH (ref 65–99)

## 2017-07-17 LAB — CBC
HCT: 29.8 % — ABNORMAL LOW (ref 36.0–46.0)
HCT: 29.7 % — ABNORMAL LOW (ref 36.0–46.0)
Hemoglobin: 10.1 g/dL — ABNORMAL LOW (ref 12.0–15.0)
Hemoglobin: 10.1 g/dL — ABNORMAL LOW (ref 12.0–15.0)
MCH: 30 pg (ref 26.0–34.0)
MCH: 29.8 pg (ref 26.0–34.0)
MCHC: 33.9 g/dL (ref 30.0–36.0)
MCHC: 34 g/dL (ref 30.0–36.0)
MCV: 88.4 fL (ref 78.0–100.0)
MCV: 87.6 fL (ref 78.0–100.0)
Platelets: 88 10*3/uL — ABNORMAL LOW (ref 150–400)
Platelets: 130 10*3/uL — ABNORMAL LOW (ref 150–400)
RBC: 3.37 MIL/uL — ABNORMAL LOW (ref 3.87–5.11)
RBC: 3.39 MIL/uL — ABNORMAL LOW (ref 3.87–5.11)
RDW: 14.8 % (ref 11.5–15.5)
RDW: 15.4 % (ref 11.5–15.5)
WBC: 16 10*3/uL — ABNORMAL HIGH (ref 4.0–10.5)
WBC: 17.9 10*3/uL — ABNORMAL HIGH (ref 4.0–10.5)

## 2017-07-17 LAB — SEROTONIN RELEASE ASSAY (SRA)
SRA .2 IU/mL UFH Ser-aCnc: 1 % (ref 0–20)
SRA 100IU/mL UFH Ser-aCnc: 1 % (ref 0–20)

## 2017-07-17 LAB — POCT I-STAT 7, (LYTES, BLD GAS, ICA,H+H)
Acid-Base Excess: 4 mmol/L — ABNORMAL HIGH (ref 0.0–2.0)
Bicarbonate: 27.3 mmol/L (ref 20.0–28.0)
Calcium, Ion: 1.13 mmol/L — ABNORMAL LOW (ref 1.15–1.40)
HCT: 23 % — ABNORMAL LOW (ref 36.0–46.0)
Hemoglobin: 7.8 g/dL — ABNORMAL LOW (ref 12.0–15.0)
O2 Saturation: 95 %
Patient temperature: 37.1
Potassium: 3.6 mmol/L (ref 3.5–5.1)
Sodium: 133 mmol/L — ABNORMAL LOW (ref 135–145)
TCO2: 28 mmol/L (ref 22–32)
pCO2 arterial: 36.4 mmHg (ref 32.0–48.0)
pH, Arterial: 7.483 — ABNORMAL HIGH (ref 7.350–7.450)
pO2, Arterial: 71 mmHg — ABNORMAL LOW (ref 83.0–108.0)

## 2017-07-17 LAB — POCT I-STAT 3, ART BLOOD GAS (G3+)
Acid-Base Excess: 3 mmol/L — ABNORMAL HIGH (ref 0.0–2.0)
Acid-Base Excess: 3 mmol/L — ABNORMAL HIGH (ref 0.0–2.0)
Acid-Base Excess: 3 mmol/L — ABNORMAL HIGH (ref 0.0–2.0)
Acid-base deficit: 1 mmol/L (ref 0.0–2.0)
Bicarbonate: 26.8 mmol/L (ref 20.0–28.0)
Bicarbonate: 27.7 mmol/L (ref 20.0–28.0)
Bicarbonate: 27.3 mmol/L (ref 20.0–28.0)
Bicarbonate: 25 mmol/L (ref 20.0–28.0)
Bicarbonate: 24.7 mmol/L (ref 20.0–28.0)
O2 Saturation: 94 %
O2 Saturation: 99 %
O2 Saturation: 99 %
O2 Saturation: 98 %
O2 Saturation: 92 %
Patient temperature: 38
Patient temperature: 36.3
Patient temperature: 36.9
TCO2: 28 mmol/L (ref 22–32)
TCO2: 29 mmol/L (ref 22–32)
TCO2: 29 mmol/L (ref 22–32)
TCO2: 26 mmol/L (ref 22–32)
TCO2: 26 mmol/L (ref 22–32)
pCO2 arterial: 38.2 mmHg (ref 32.0–48.0)
pCO2 arterial: 41.1 mmHg (ref 32.0–48.0)
pCO2 arterial: 40.4 mmHg (ref 32.0–48.0)
pCO2 arterial: 43.7 mmHg (ref 32.0–48.0)
pCO2 arterial: 41.6 mmHg (ref 32.0–48.0)
pH, Arterial: 7.458 — ABNORMAL HIGH (ref 7.350–7.450)
pH, Arterial: 7.436 (ref 7.350–7.450)
pH, Arterial: 7.438 (ref 7.350–7.450)
pH, Arterial: 7.361 (ref 7.350–7.450)
pH, Arterial: 7.381 (ref 7.350–7.450)
pO2, Arterial: 70 mmHg — ABNORMAL LOW (ref 83.0–108.0)
pO2, Arterial: 133 mmHg — ABNORMAL HIGH (ref 83.0–108.0)
pO2, Arterial: 139 mmHg — ABNORMAL HIGH (ref 83.0–108.0)
pO2, Arterial: 109 mmHg — ABNORMAL HIGH (ref 83.0–108.0)
pO2, Arterial: 63 mmHg — ABNORMAL LOW (ref 83.0–108.0)

## 2017-07-17 LAB — BPAM RBC
Blood Product Expiration Date: 201902072359
ISSUE DATE / TIME: 201901172038
Unit Type and Rh: 6200

## 2017-07-17 LAB — BASIC METABOLIC PANEL
Anion gap: 10 (ref 5–15)
BUN: 8 mg/dL (ref 6–20)
CO2: 28 mmol/L (ref 22–32)
Calcium: 8 mg/dL — ABNORMAL LOW (ref 8.9–10.3)
Chloride: 100 mmol/L — ABNORMAL LOW (ref 101–111)
Creatinine, Ser: 0.57 mg/dL (ref 0.44–1.00)
GFR calc Af Amer: >60 mL/min (ref >60)
GFR calc non Af Amer: >60 mL/min (ref >60)
Glucose, Bld: 126 mg/dL — ABNORMAL HIGH (ref 65–99)
Potassium: 4.2 mmol/L (ref 3.5–5.1)
Sodium: 138 mmol/L (ref 135–145)

## 2017-07-17 LAB — TYPE AND SCREEN
ABO/RH(D): A POS
Antibody Screen: NEGATIVE
Unit division: 0

## 2017-07-17 LAB — CBC WITH DIFFERENTIAL/PLATELET
BASOS ABS: 0 10*3/uL (ref 0.0–0.1)
Basophils Relative: 0 %
EOS ABS: 0.1 10*3/uL (ref 0.0–0.7)
Eosinophils Relative: 1 %
HEMATOCRIT: 23 % — AB (ref 36.0–46.0)
HEMOGLOBIN: 7.8 g/dL — AB (ref 12.0–15.0)
LYMPHS ABS: 1.5 10*3/uL (ref 0.7–4.0)
LYMPHS PCT: 12 %
MCH: 30 pg (ref 26.0–34.0)
MCHC: 33.9 g/dL (ref 30.0–36.0)
MCV: 88.5 fL (ref 78.0–100.0)
MONOS PCT: 15 %
Monocytes Absolute: 1.9 10*3/uL — ABNORMAL HIGH (ref 0.1–1.0)
NEUTROS ABS: 9 10*3/uL — AB (ref 1.7–7.7)
Neutrophils Relative %: 72 %
Platelets: 153 10*3/uL (ref 150–400)
RBC: 2.6 MIL/uL — AB (ref 3.87–5.11)
RDW: 15.8 % — AB (ref 11.5–15.5)
WBC: 12.5 10*3/uL — AB (ref 4.0–10.5)

## 2017-07-17 LAB — APTT
aPTT: 60 seconds — ABNORMAL HIGH (ref 24–36)
aPTT: 33 seconds (ref 24–36)

## 2017-07-17 LAB — PREPARE RBC (CROSSMATCH)

## 2017-07-17 LAB — FIBRINOGEN: Fibrinogen: 643 mg/dL — ABNORMAL HIGH (ref 210–475)

## 2017-07-17 LAB — COOXEMETRY PANEL
Carboxyhemoglobin: 1.8 % — ABNORMAL HIGH (ref 0.5–1.5)
Methemoglobin: 0.8 % (ref 0.0–1.5)
O2 Saturation: 52.4 %
Total hemoglobin: 9.3 g/dL — ABNORMAL LOW (ref 12.0–16.0)

## 2017-07-17 LAB — HEPARIN LEVEL (UNFRACTIONATED): Heparin Unfractionated: 0.11 IU/mL — ABNORMAL LOW (ref 0.30–0.70)

## 2017-07-17 LAB — POCT I-STAT 4, (NA,K, GLUC, HGB,HCT)
Glucose, Bld: 113 mg/dL — ABNORMAL HIGH (ref 65–99)
HCT: 29 % — ABNORMAL LOW (ref 36.0–46.0)
Hemoglobin: 9.9 g/dL — ABNORMAL LOW (ref 12.0–15.0)
Potassium: 3.6 mmol/L (ref 3.5–5.1)
Sodium: 137 mmol/L (ref 135–145)

## 2017-07-17 LAB — MAGNESIUM
MAGNESIUM: 1.3 mg/dL — AB (ref 1.7–2.4)
Magnesium: 2.6 mg/dL — ABNORMAL HIGH (ref 1.7–2.4)

## 2017-07-17 LAB — POCT ACTIVATED CLOTTING TIME
Activated Clotting Time: 158 seconds
Activated Clotting Time: 164 seconds

## 2017-07-17 LAB — PROTIME-INR
INR: 1.2
Prothrombin Time: 15.1 seconds (ref 11.4–15.2)

## 2017-07-17 LAB — PHOSPHORUS: PHOSPHORUS: 3.8 mg/dL (ref 2.5–4.6)

## 2017-07-17 LAB — CALCIUM, IONIZED: Calcium, Ionized, Serum: 5 mg/dL (ref 4.5–5.6)

## 2017-07-17 SURGERY — REMOVAL, VENTRICULAR ASSIST DEVICE, EXTRACORPOREAL
Anesthesia: General | Site: Chest

## 2017-07-17 MED ORDER — NITROGLYCERIN IN D5W 200-5 MCG/ML-% IV SOLN: 0.0000 ug/min | INTRAVENOUS | Status: DC

## 2017-07-17 MED ORDER — SODIUM CHLORIDE 0.9 % IV SOLN
250.0000 mL | INTRAVENOUS | Status: DC
  Administered 2017-07-22: 250 mL via INTRAVENOUS

## 2017-07-17 MED ORDER — ACETAMINOPHEN 650 MG RE SUPP: 650.0000 mg | Freq: Once | RECTAL | Status: DC

## 2017-07-17 MED ORDER — SODIUM CHLORIDE 0.9 % IJ SOLN
OROMUCOSAL | Status: DC | PRN
  Administered 2017-07-17 (×3): via TOPICAL

## 2017-07-17 MED ORDER — ROCURONIUM BROMIDE 100 MG/10ML IV SOLN
INTRAVENOUS | Status: DC | PRN
  Administered 2017-07-17: 50 mg via INTRAVENOUS

## 2017-07-17 MED ORDER — SODIUM CHLORIDE 0.9% FLUSH: 3.0000 mL | INTRAVENOUS | Status: DC | PRN

## 2017-07-17 MED ORDER — INSULIN REGULAR BOLUS VIA INFUSION
0.0000 [IU] | Freq: Three times a day (TID) | INTRAVENOUS | Status: DC
  Filled 2017-07-17: qty 10

## 2017-07-17 MED ORDER — ASPIRIN EC 325 MG PO TBEC
325.0000 mg | DELAYED_RELEASE_TABLET | Freq: Every day | ORAL | Status: DC
  Administered 2017-07-28: 325 mg via ORAL
  Filled 2017-07-17: qty 1

## 2017-07-17 MED ORDER — ACETAMINOPHEN 500 MG PO TABS: 1000.0000 mg | ORAL_TABLET | Freq: Four times a day (QID) | ORAL | Status: AC

## 2017-07-17 MED ORDER — FENTANYL CITRATE (PF) 250 MCG/5ML IJ SOLN
INTRAMUSCULAR | Status: AC
  Filled 2017-07-17: qty 5

## 2017-07-17 MED ORDER — MAGNESIUM SULFATE 4 GM/100ML IV SOLN
4.0000 g | Freq: Once | INTRAVENOUS | Status: AC
  Administered 2017-07-17: 4 g via INTRAVENOUS
  Filled 2017-07-17: qty 100

## 2017-07-17 MED ORDER — VANCOMYCIN HCL 1000 MG IV SOLR
INTRAVENOUS | Status: DC
  Filled 2017-07-17: qty 1000

## 2017-07-17 MED ORDER — LACTATED RINGERS IV SOLN: INTRAVENOUS | Status: DC

## 2017-07-17 MED ORDER — EPINEPHRINE PF 1 MG/ML IJ SOLN
0.0000 ug/min | INTRAVENOUS | Status: DC
  Administered 2017-07-17: 7 ug/min via INTRAVENOUS
  Administered 2017-07-18 – 2017-07-19 (×2): 5 ug/min via INTRAVENOUS
  Filled 2017-07-17 (×5): qty 4

## 2017-07-17 MED ORDER — METOPROLOL TARTRATE 5 MG/5ML IV SOLN
2.5000 mg | INTRAVENOUS | Status: DC | PRN
  Administered 2017-07-19: 5 mg via INTRAVENOUS
  Filled 2017-07-17 (×3): qty 5

## 2017-07-17 MED ORDER — ROCURONIUM BROMIDE 10 MG/ML (PF) SYRINGE
PREFILLED_SYRINGE | INTRAVENOUS | Status: AC
  Filled 2017-07-17: qty 5

## 2017-07-17 MED ORDER — POTASSIUM CHLORIDE 10 MEQ/50ML IV SOLN
INTRAVENOUS | Status: AC
  Filled 2017-07-17: qty 50

## 2017-07-17 MED ORDER — PROPOFOL 10 MG/ML IV BOLUS
INTRAVENOUS | Status: AC
  Filled 2017-07-17: qty 20

## 2017-07-17 MED ORDER — MIDAZOLAM HCL 5 MG/5ML IJ SOLN
INTRAMUSCULAR | Status: DC | PRN
  Administered 2017-07-17: 2 mg via INTRAVENOUS

## 2017-07-17 MED ORDER — DEXAMETHASONE SODIUM PHOSPHATE 10 MG/ML IJ SOLN
INTRAMUSCULAR | Status: AC
  Filled 2017-07-17: qty 1

## 2017-07-17 MED ORDER — FUROSEMIDE 10 MG/ML IJ SOLN
INTRAMUSCULAR | Status: AC
  Filled 2017-07-17: qty 2

## 2017-07-17 MED ORDER — FENTANYL CITRATE (PF) 100 MCG/2ML IJ SOLN
INTRAMUSCULAR | Status: DC | PRN
  Administered 2017-07-17: 100 ug via INTRAVENOUS
  Administered 2017-07-17: 50 ug via INTRAVENOUS
  Administered 2017-07-17: 100 ug via INTRAVENOUS
  Administered 2017-07-17: 50 ug via INTRAVENOUS
  Administered 2017-07-17: 100 ug via INTRAVENOUS
  Administered 2017-07-17: 200 ug via INTRAVENOUS
  Administered 2017-07-17: 50 ug via INTRAVENOUS
  Administered 2017-07-17: 100 ug via INTRAVENOUS
  Administered 2017-07-17: 50 ug via INTRAVENOUS
  Administered 2017-07-17 (×2): 100 ug via INTRAVENOUS
  Administered 2017-07-17: 50 ug via INTRAVENOUS

## 2017-07-17 MED ORDER — LIDOCAINE 2% (20 MG/ML) 5 ML SYRINGE
INTRAMUSCULAR | Status: AC
  Filled 2017-07-17: qty 5

## 2017-07-17 MED ORDER — POTASSIUM CHLORIDE 10 MEQ/50ML IV SOLN
10.0000 meq | INTRAVENOUS | Status: AC
  Administered 2017-07-17 (×3): 10 meq via INTRAVENOUS
  Filled 2017-07-17 (×2): qty 50

## 2017-07-17 MED ORDER — VANCOMYCIN HCL 1000 MG IV SOLR
INTRAVENOUS | Status: DC | PRN
  Administered 2017-07-17: 09:00:00

## 2017-07-17 MED ORDER — MIDAZOLAM HCL 2 MG/2ML IJ SOLN
2.0000 mg | INTRAMUSCULAR | Status: DC | PRN
  Administered 2017-07-17 – 2017-07-19 (×13): 2 mg via INTRAVENOUS
  Filled 2017-07-17 (×13): qty 2

## 2017-07-17 MED ORDER — SODIUM CHLORIDE 0.9% FLUSH
3.0000 mL | Freq: Two times a day (BID) | INTRAVENOUS | Status: DC
  Administered 2017-07-18 – 2017-07-23 (×9): 3 mL via INTRAVENOUS

## 2017-07-17 MED ORDER — METOPROLOL TARTRATE 25 MG/10 ML ORAL SUSPENSION: 12.5000 mg | Freq: Two times a day (BID) | ORAL | Status: DC

## 2017-07-17 MED ORDER — HEPARIN SODIUM (PORCINE) 1000 UNIT/ML IJ SOLN
INTRAMUSCULAR | Status: DC | PRN
  Administered 2017-07-17: 2000 [IU] via INTRAVENOUS

## 2017-07-17 MED ORDER — LACTATED RINGERS IV SOLN
INTRAVENOUS | Status: DC | PRN
  Administered 2017-07-17: 07:00:00 via INTRAVENOUS

## 2017-07-17 MED ORDER — PROTAMINE SULFATE 10 MG/ML IV SOLN
INTRAVENOUS | Status: DC | PRN
  Administered 2017-07-17: 20 mg via INTRAVENOUS
  Administered 2017-07-17: 100 mg via INTRAVENOUS
  Administered 2017-07-17: 20 mg via INTRAVENOUS
  Administered 2017-07-17: 40 mg via INTRAVENOUS

## 2017-07-17 MED ORDER — VANCOMYCIN HCL IN DEXTROSE 1-5 GM/200ML-% IV SOLN
1000.0000 mg | Freq: Two times a day (BID) | INTRAVENOUS | Status: AC
  Administered 2017-07-17 – 2017-07-18 (×2): 1000 mg via INTRAVENOUS
  Filled 2017-07-17 (×2): qty 200

## 2017-07-17 MED ORDER — PHENYLEPHRINE 40 MCG/ML (10ML) SYRINGE FOR IV PUSH (FOR BLOOD PRESSURE SUPPORT)
PREFILLED_SYRINGE | INTRAVENOUS | Status: AC
  Filled 2017-07-17: qty 10

## 2017-07-17 MED ORDER — ASPIRIN 81 MG PO CHEW
324.0000 mg | CHEWABLE_TABLET | Freq: Every day | ORAL | Status: DC
  Administered 2017-07-18 – 2017-08-12 (×22): 324 mg
  Filled 2017-07-17 (×24): qty 4

## 2017-07-17 MED ORDER — PANTOPRAZOLE SODIUM 40 MG IV SOLR
40.0000 mg | Freq: Two times a day (BID) | INTRAVENOUS | Status: DC
  Administered 2017-07-17 – 2017-08-12 (×53): 40 mg via INTRAVENOUS
  Filled 2017-07-17 (×52): qty 40

## 2017-07-17 MED ORDER — FENTANYL CITRATE (PF) 2500 MCG/50ML IJ SOLN
0.0000 ug/h | INTRAMUSCULAR | Status: DC
  Administered 2017-07-17 – 2017-07-18 (×3): 300 ug/h via INTRAVENOUS
  Filled 2017-07-17 (×4): qty 50

## 2017-07-17 MED ORDER — SODIUM CHLORIDE 0.45 % IV SOLN
INTRAVENOUS | Status: DC | PRN
  Administered 2017-07-17: 12:00:00 via INTRAVENOUS

## 2017-07-17 MED ORDER — HEPARIN SODIUM (PORCINE) 1000 UNIT/ML IJ SOLN
INTRAMUSCULAR | Status: AC
  Filled 2017-07-17: qty 2

## 2017-07-17 MED ORDER — SODIUM CHLORIDE 0.9 % IV SOLN
0.0000 ug/min | INTRAVENOUS | Status: DC
  Filled 2017-07-17: qty 2

## 2017-07-17 MED ORDER — DEXMEDETOMIDINE HCL IN NACL 400 MCG/100ML IV SOLN
0.0000 ug/kg/h | INTRAVENOUS | Status: DC
  Administered 2017-07-17 (×3): 1.2 ug/kg/h via INTRAVENOUS
  Administered 2017-07-17: 0.7 ug/kg/h via INTRAVENOUS
  Administered 2017-07-18 – 2017-07-19 (×9): 1.2 ug/kg/h via INTRAVENOUS
  Filled 2017-07-17 (×14): qty 100

## 2017-07-17 MED ORDER — DEXAMETHASONE SODIUM PHOSPHATE 10 MG/ML IJ SOLN
INTRAMUSCULAR | Status: DC | PRN
  Administered 2017-07-17: 10 mg via INTRAVENOUS

## 2017-07-17 MED ORDER — ONDANSETRON HCL 4 MG/2ML IJ SOLN
INTRAMUSCULAR | Status: DC | PRN
  Administered 2017-07-17: 4 mg via INTRAVENOUS

## 2017-07-17 MED ORDER — FUROSEMIDE 10 MG/ML IJ SOLN
20.0000 mg | Freq: Once | INTRAMUSCULAR | Status: AC
  Administered 2017-07-17: 20 mg via INTRAVENOUS
  Filled 2017-07-17: qty 2

## 2017-07-17 MED ORDER — DEXTROSE 5 % IV SOLN
1.5000 g | Freq: Two times a day (BID) | INTRAVENOUS | Status: DC
  Administered 2017-07-17 – 2017-07-18 (×3): 1.5 g via INTRAVENOUS
  Filled 2017-07-17 (×4): qty 1.5

## 2017-07-17 MED ORDER — ALBUMIN HUMAN 5 % IV SOLN
INTRAVENOUS | Status: DC | PRN
  Administered 2017-07-17 (×2): via INTRAVENOUS

## 2017-07-17 MED ORDER — EPHEDRINE 5 MG/ML INJ
INTRAVENOUS | Status: AC
  Filled 2017-07-17: qty 10

## 2017-07-17 MED ORDER — MILRINONE LACTATE IN DEXTROSE 20-5 MG/100ML-% IV SOLN: 0.2500 ug/kg/min | INTRAVENOUS | Status: AC

## 2017-07-17 MED ORDER — ALBUMIN HUMAN 5 % IV SOLN
250.0000 mL | INTRAVENOUS | Status: DC | PRN
  Administered 2017-07-17: 250 mL via INTRAVENOUS
  Filled 2017-07-17: qty 250

## 2017-07-17 MED ORDER — SODIUM CHLORIDE 0.9 % IV SOLN
INTRAVENOUS | Status: DC
  Administered 2017-07-17: 18:00:00 via INTRAVENOUS

## 2017-07-17 MED ORDER — ONDANSETRON HCL 4 MG/2ML IJ SOLN
INTRAMUSCULAR | Status: AC
  Filled 2017-07-17: qty 2

## 2017-07-17 MED ORDER — SODIUM CHLORIDE 0.9 % IV SOLN
Freq: Once | INTRAVENOUS | Status: AC
  Administered 2017-07-17: 05:00:00 via INTRAVENOUS

## 2017-07-17 MED ORDER — CHLORHEXIDINE GLUCONATE 0.12% ORAL RINSE (MEDLINE KIT)
15.0000 mL | Freq: Two times a day (BID) | OROMUCOSAL | Status: DC
  Administered 2017-07-17 – 2017-07-19 (×5): 15 mL via OROMUCOSAL

## 2017-07-17 MED ORDER — PANTOPRAZOLE SODIUM 40 MG PO TBEC: 40.0000 mg | DELAYED_RELEASE_TABLET | Freq: Every day | ORAL | Status: DC

## 2017-07-17 MED ORDER — METOPROLOL TARTRATE 12.5 MG HALF TABLET: 12.5000 mg | ORAL_TABLET | Freq: Two times a day (BID) | ORAL | Status: DC

## 2017-07-17 MED ORDER — MIDAZOLAM HCL 2 MG/2ML IJ SOLN
INTRAMUSCULAR | Status: AC
  Filled 2017-07-17: qty 2

## 2017-07-17 MED ORDER — BISACODYL 5 MG PO TBEC
10.0000 mg | DELAYED_RELEASE_TABLET | Freq: Every day | ORAL | Status: DC
  Administered 2017-07-28 – 2017-08-03 (×5): 10 mg via ORAL
  Filled 2017-07-17 (×6): qty 2

## 2017-07-17 MED ORDER — MAGNESIUM SULFATE 2 GM/50ML IV SOLN
2.0000 g | Freq: Once | INTRAVENOUS | Status: AC
  Administered 2017-07-17: 2 g via INTRAVENOUS
  Filled 2017-07-17: qty 50

## 2017-07-17 MED ORDER — DOCUSATE SODIUM 100 MG PO CAPS: 200.0000 mg | ORAL_CAPSULE | Freq: Every day | ORAL | Status: DC

## 2017-07-17 MED ORDER — ACETAMINOPHEN 160 MG/5ML PO SOLN
650.0000 mg | Freq: Once | ORAL | Status: DC
  Filled 2017-07-17: qty 20.3

## 2017-07-17 MED ORDER — NOREPINEPHRINE BITARTRATE 1 MG/ML IV SOLN
0.0000 ug/min | INTRAVENOUS | Status: DC
  Filled 2017-07-17: qty 4

## 2017-07-17 MED ORDER — ONDANSETRON HCL 4 MG/2ML IJ SOLN: 4.0000 mg | Freq: Four times a day (QID) | INTRAMUSCULAR | Status: DC | PRN

## 2017-07-17 MED ORDER — OXYCODONE HCL 5 MG PO TABS
5.0000 mg | ORAL_TABLET | ORAL | Status: DC | PRN
  Administered 2017-07-18: 10 mg via ORAL
  Filled 2017-07-17: qty 2

## 2017-07-17 MED ORDER — ACETAMINOPHEN 160 MG/5ML PO SOLN
1000.0000 mg | Freq: Four times a day (QID) | ORAL | Status: AC
  Administered 2017-07-18 – 2017-07-22 (×15): 1000 mg
  Filled 2017-07-17 (×15): qty 40.6

## 2017-07-17 MED ORDER — TRAMADOL HCL 50 MG PO TABS: 50.0000 mg | ORAL_TABLET | ORAL | Status: DC | PRN

## 2017-07-17 MED ORDER — HEPARIN SODIUM (PORCINE) 1000 UNIT/ML IJ SOLN: INTRAMUSCULAR | Status: DC | PRN

## 2017-07-17 MED ORDER — BISACODYL 10 MG RE SUPP
10.0000 mg | Freq: Every day | RECTAL | Status: DC
  Administered 2017-07-21 – 2017-08-02 (×8): 10 mg via RECTAL
  Filled 2017-07-17 (×9): qty 1

## 2017-07-17 MED ORDER — MORPHINE SULFATE (PF) 4 MG/ML IV SOLN
1.0000 mg | INTRAVENOUS | Status: DC | PRN
  Administered 2017-07-17: 4 mg via INTRAVENOUS
  Filled 2017-07-17: qty 1

## 2017-07-17 MED ORDER — PHENYLEPHRINE HCL 10 MG/ML IJ SOLN
INTRAMUSCULAR | Status: DC | PRN
  Administered 2017-07-17 (×5): 40 ug via INTRAVENOUS
  Administered 2017-07-17: 120 ug via INTRAVENOUS
  Administered 2017-07-17: 80 ug via INTRAVENOUS

## 2017-07-17 MED ORDER — MILRINONE LACTATE IN DEXTROSE 20-5 MG/100ML-% IV SOLN
0.2500 ug/kg/min | INTRAVENOUS | Status: DC
  Administered 2017-07-17 – 2017-07-24 (×12): 0.25 ug/kg/min via INTRAVENOUS
  Filled 2017-07-17 (×12): qty 100

## 2017-07-17 MED ORDER — PROTAMINE SULFATE 10 MG/ML IV SOLN
INTRAVENOUS | Status: AC
  Filled 2017-07-17: qty 25

## 2017-07-17 MED ORDER — ORAL CARE MOUTH RINSE
15.0000 mL | OROMUCOSAL | Status: DC
  Administered 2017-07-17 – 2017-07-19 (×22): 15 mL via OROMUCOSAL

## 2017-07-17 MED ORDER — CHLORHEXIDINE GLUCONATE 0.12 % MT SOLN
15.0000 mL | OROMUCOSAL | Status: AC
  Administered 2017-07-17: 15 mL via OROMUCOSAL
  Filled 2017-07-17: qty 15

## 2017-07-17 MED ORDER — AMIODARONE HCL IN DEXTROSE 360-4.14 MG/200ML-% IV SOLN
30.0000 mg/h | INTRAVENOUS | Status: DC
  Administered 2017-07-17 – 2017-07-26 (×21): 30 mg/h via INTRAVENOUS
  Filled 2017-07-17 (×20): qty 200

## 2017-07-17 MED ORDER — FAMOTIDINE IN NACL 20-0.9 MG/50ML-% IV SOLN
20.0000 mg | Freq: Two times a day (BID) | INTRAVENOUS | Status: AC
  Administered 2017-07-17 (×2): 20 mg via INTRAVENOUS
  Filled 2017-07-17 (×2): qty 50

## 2017-07-17 MED ORDER — LACTATED RINGERS IV SOLN: 500.0000 mL | Freq: Once | INTRAVENOUS | Status: DC | PRN

## 2017-07-17 MED ORDER — HEMOSTATIC AGENTS (NO CHARGE) OPTIME
TOPICAL | Status: DC | PRN
  Administered 2017-07-17 (×2): 1 via TOPICAL

## 2017-07-17 MED ORDER — SODIUM CHLORIDE 0.9 % IV SOLN
INTRAVENOUS | Status: DC
  Filled 2017-07-17: qty 1

## 2017-07-17 SURGICAL SUPPLY — 54 items
BAG DECANTER FOR FLEXI CONT (MISCELLANEOUS) ×4 IMPLANT
BLADE SURG 12 STRL SS (BLADE) ×4 IMPLANT
CANISTER SUCT 3000ML PPV (MISCELLANEOUS) ×4 IMPLANT
CANNULA FEM VENOUS REMOTE 22FR (CANNULA) ×2 IMPLANT
CANNULA GUNDRY RCSP 15FR (MISCELLANEOUS) ×4 IMPLANT
CATH THORACIC 36FR RT ANG (CATHETERS) ×8 IMPLANT
CLIP VESOCCLUDE SM WIDE 24/CT (CLIP) ×2 IMPLANT
CONN ST 1/4X3/8  BEN (MISCELLANEOUS) ×2
CONN ST 1/4X3/8 BEN (MISCELLANEOUS) IMPLANT
CRADLE DONUT ADULT HEAD (MISCELLANEOUS) ×4 IMPLANT
DRAIN CHANNEL 32F RND 10.7 FF (WOUND CARE) ×2 IMPLANT
DRAPE CARDIOVASCULAR INCISE (DRAPES) ×4
DRAPE SRG 135X102X78XABS (DRAPES) ×2 IMPLANT
DRSG AQUACEL AG ADV 3.5X14 (GAUZE/BANDAGES/DRESSINGS) ×4 IMPLANT
ELECT BLADE 4.0 EZ CLEAN MEGAD (MISCELLANEOUS) ×4
ELECT BLADE 6.5 EXT (BLADE) ×4 IMPLANT
ELECT CAUTERY BLADE 6.4 (BLADE) ×4 IMPLANT
ELECT REM PT RETURN 9FT ADLT (ELECTROSURGICAL) ×8
ELECTRODE BLDE 4.0 EZ CLN MEGD (MISCELLANEOUS) ×2 IMPLANT
ELECTRODE REM PT RTRN 9FT ADLT (ELECTROSURGICAL) ×4 IMPLANT
FLOSEAL 10ML (HEMOSTASIS) ×2 IMPLANT
GAUZE SPONGE 4X4 12PLY STRL (GAUZE/BANDAGES/DRESSINGS) ×8 IMPLANT
GAUZE SPONGE 4X4 12PLY STRL LF (GAUZE/BANDAGES/DRESSINGS) ×2 IMPLANT
GAUZE XEROFORM 5X9 LF (GAUZE/BANDAGES/DRESSINGS) ×2 IMPLANT
GLOVE BIO SURGEON STRL SZ7.5 (GLOVE) ×8 IMPLANT
GLOVE BIOGEL PI IND STRL 6 (GLOVE) IMPLANT
GLOVE BIOGEL PI INDICATOR 6 (GLOVE) ×8
GOWN STRL REUS W/ TWL LRG LVL3 (GOWN DISPOSABLE) ×8 IMPLANT
GOWN STRL REUS W/TWL LRG LVL3 (GOWN DISPOSABLE) ×16
HEMOSTAT POWDER SURGIFOAM 1G (HEMOSTASIS) ×12 IMPLANT
HEMOSTAT SURGICEL 2X14 (HEMOSTASIS) ×4 IMPLANT
KIT BASIN OR (CUSTOM PROCEDURE TRAY) ×4 IMPLANT
KIT REMOVER STAPLE SKIN (MISCELLANEOUS) ×2 IMPLANT
KIT ROOM TURNOVER OR (KITS) ×4 IMPLANT
KIT SUCTION CATH 14FR (SUCTIONS) ×4 IMPLANT
LEAD PACING MYOCARDI (MISCELLANEOUS) ×4 IMPLANT
NS IRRIG 1000ML POUR BTL (IV SOLUTION) ×20 IMPLANT
PACK E OPEN HEART (SUTURE) ×4 IMPLANT
PACK OPEN HEART (CUSTOM PROCEDURE TRAY) ×4 IMPLANT
PAD ARMBOARD 7.5X6 YLW CONV (MISCELLANEOUS) ×8 IMPLANT
SPONGE LAP 18X18 X RAY DECT (DISPOSABLE) ×2 IMPLANT
SUT ETHILON 3 0 FSL (SUTURE) ×4 IMPLANT
SUT ETHILON 3 0 PS 1 (SUTURE) ×2 IMPLANT
SUT PROLENE 4 0 SH DA (SUTURE) ×6 IMPLANT
SUT STEEL STERNAL CCS#1 18IN (SUTURE) ×2 IMPLANT
SUT STEEL SZ 6 DBL 3X14 BALL (SUTURE) ×4 IMPLANT
SUT VIC AB 1 CTX 36 (SUTURE) ×4
SUT VIC AB 1 CTX36XBRD ANBCTR (SUTURE) IMPLANT
SUT VIC AB 3-0 SH 8-18 (SUTURE) ×4 IMPLANT
SYSTEM SAHARA CHEST DRAIN ATS (WOUND CARE) ×4 IMPLANT
TAPE CLOTH SURG 4X10 WHT LF (GAUZE/BANDAGES/DRESSINGS) ×4 IMPLANT
TAPE PAPER 2X10 WHT MICROPORE (GAUZE/BANDAGES/DRESSINGS) ×2 IMPLANT
TOWEL GREEN STERILE (TOWEL DISPOSABLE) ×4 IMPLANT
WATER STERILE IRR 1000ML POUR (IV SOLUTION) ×8 IMPLANT

## 2017-07-17 NOTE — Progress Notes (Signed)
Patient ID: [name], female   DOB: 01/28/1941, 77 y.o.   MRN: [id number]  TCTS Evening Rounds:   Hemodynamically stable on epi 8, levophed 8, milrinone 0.25 CI = 2 CVP 9 PA 25/10  Sedated on vent but has followed commands  Urine output good  CT output low  CBC    Component Value Date/Time   WBC 16.0 (H) 07/17/2017 1144   RBC 3.37 (L) 07/17/2017 1144   HGB 9.5 (L) 07/17/2017 1715   HGB 11.8 03/15/2017 0920   HCT 28.0 (L) 07/17/2017 1715   HCT 36.3 03/15/2017 0920   PLT 88 (L) 07/17/2017 1144   PLT 251 03/15/2017 0920   MCV 88.4 07/17/2017 1144   MCV 87 03/15/2017 0920   MCH 30.0 07/17/2017 1144   MCHC 33.9 07/17/2017 1144   RDW 14.8 07/17/2017 1144   RDW 13.8 03/15/2017 0920   LYMPHSABS 1.5 07/17/2017 0346   LYMPHSABS 1.4 03/15/2017 0920   MONOABS 1.9 (H) 07/17/2017 0346   EOSABS 0.1 07/17/2017 0346   EOSABS 0.2 03/15/2017 0920   BASOSABS 0.0 07/17/2017 0346   BASOSABS 0.0 03/15/2017 0920     BMET    Component Value Date/Time   NA 134 (L) 07/17/2017 1715   NA 143 03/15/2017 0920   K 4.3 07/17/2017 1715   CL 98 (L) 07/17/2017 1715   CO2 28 07/17/2017 0346   GLUCOSE 135 (H) 07/17/2017 1715   BUN 11 07/17/2017 1715   BUN 12 03/15/2017 0920   CREATININE 0.50 07/17/2017 1715   CREATININE 0.65 10/25/2012 0826   CALCIUM 8.0 (L) 07/17/2017 0346   GFRNONAA >60 07/17/2017 0346   GFRNONAA >89 10/25/2012 0826   GFRAA >60 07/17/2017 0346   GFRAA >89 10/25/2012 0826     A/P:  Stable postop course. Continue current plans

## 2017-07-17 NOTE — Progress Notes (Signed)
Advanced Heart Failure Rounding Note  Primary Cardiologist: Dr. Percival Spanish (2014) HF: (New) Dr. Haroldine Laws   Subjective:    Events - 07/10/17 S/p AVR and ascending AA replacement  - 07/10/17 VF Arrest in evening with shock x 1 - 07/11/17 RVAD placement  - 07/17/2017 RVAD removed with chest closed  Returned to the OR earlier today. RVAD removed and chest closed.   Currently on NE 5 mcg, milrinone 0.25 mcg, epi 8 mcg, amio 30 mg per hour. Intubated and sedated on 70%.   Swan #s  PAP 27/10  CO 4.2  CI 2.28    Echo 06/02/2017 LVEF 55-60%, Grade 1 DD, Mild/Mod AI with LVOT, Mid ascending aortic diameter 48.88 mm, Mild LAE, RV normal  Objective:   Weight Range: 208 lb 8.9 oz (94.6 kg) Body mass index is 34.71 kg/m.   Vital Signs:   Temp:  [97.3 F (36.3 C)-100.8 F (38.2 C)] 97.3 F (36.3 C) (01/21 1200) Pulse Rate:  [92-119] 95 (01/21 1200) Resp:  [7-26] 13 (01/21 1200) BP: (81-123)/(38-89) 99/61 (01/21 1200) SpO2:  [90 %-100 %] 100 % (01/21 1200) Arterial Line BP: (100-161)/(39-64) 122/64 (01/21 1200) FiO2 (%):  [40 %-100 %] 100 % (01/21 1200) Weight:  [208 lb 8.9 oz (94.6 kg)] 208 lb 8.9 oz (94.6 kg) (01/21 0500) Last BM Date: (PTA)  Weight change: Filed Weights   07/15/17 0300 07/16/17 0400 07/17/17 0500  Weight: 191 lb 9.3 oz (86.9 kg) 198 lb 6.6 oz (90 kg) 208 lb 8.9 oz (94.6 kg)    Intake/Output:   Intake/Output Summary (Last 24 hours) at 07/17/2017 1444 Last data filed at 07/17/2017 1400 Gross per 24 hour  Intake 7715.04 ml  Output 4225 ml  Net 3490.04 ml    Physical Exam  Swan RIJ L Subclavian  General:  Intubated/sedated  HEENT:  ETT  Neck: supple. Hard to assess with lines. Carotids 2+ bilat; no bruits. No lymphadenopathy or thryomegaly appreciated. Cor: PMI nondisplaced. Regular rate & rhythm. No rubs, gallops or murmurs. Lungs: clear Abdomen: soft, nontender, nondistended. No hepatosplenomegaly. No bruits or masses. Good bowel sounds.    Extremities: no cyanosis, clubbing, rash, R and LLE 2+ edema Neuro: intubated/sedated  GU: foley   Telemetry   NSR   EKG    No new tracings.    Labs    CBC Recent Labs    07/16/17 0233  07/17/17 0346  07/17/17 1049 07/17/17 1144  WBC 11.0*  --  12.5*  --   --  16.0*  NEUTROABS 7.5  --  9.0*  --   --   --   HGB 8.0*   < > 7.8*   < > 7.5* 10.1*  HCT 24.7*   < > 23.0*   < > 22.0* 29.8*  MCV 87.0  --  88.5  --   --  88.4  PLT 125*  --  153  --   --  88*   < > = values in this interval not displayed.   Basic Metabolic Panel Recent Labs    07/16/17 0233  07/17/17 0346  07/17/17 0928 07/17/17 1049  NA 134*   < > 138   < > 135 136  K 4.2   < > 4.2   < > 3.7 3.4*  CL 98*   < > 100*   < > 96* 96*  CO2 26  --  28  --   --   --   GLUCOSE 175*   < >  126*   < > 142* 113*  BUN 10   < > 8   < > 8 8  CREATININE 0.58   < > 0.57   < > 0.50 0.50  CALCIUM 8.7*  --  8.0*  --   --   --   MG 1.5*  --  1.3*  --   --   --   PHOS 5.6*  --  3.8  --   --   --    < > = values in this interval not displayed.   Liver Function Tests Recent Labs    07/15/17 0231  AST 34  ALT 25  ALKPHOS 73  BILITOT 1.1  PROT 4.4*  ALBUMIN 2.3*   No results for input(s): LIPASE, AMYLASE in the last 72 hours. Cardiac Enzymes No results for input(s): CKTOTAL, CKMB, CKMBINDEX, TROPONINI in the last 72 hours.  BNP: BNP (last 3 results) No results for input(s): BNP in the last 8760 hours.  ProBNP (last 3 results) No results for input(s): PROBNP in the last 8760 hours.   D-Dimer No results for input(s): DDIMER in the last 72 hours. Hemoglobin A1C No results for input(s): HGBA1C in the last 72 hours. Fasting Lipid Panel No results for input(s): CHOL, HDL, LDLCALC, TRIG, CHOLHDL, LDLDIRECT in the last 72 hours. Thyroid Function Tests No results for input(s): TSH, T4TOTAL, T3FREE, THYROIDAB in the last 72 hours.  Invalid input(s): FREET3  Other results:   Imaging    Dg Chest 1  View  Result Date: 07/17/2017 CLINICAL DATA:  Status post removal of right ventricular assist device. Evaluate for pneumothorax. EXAM: CHEST 1 VIEW COMPARISON:  Portable chest x-ray of earlier today FINDINGS: The lungs are hypoinflated. There is no pneumothorax or significant pleural effusion. The interstitial markings remain increased. The cardiac silhouette is enlarged. The pulmonary vascularity is slightly less prominent than on the earlier study today. The right chest tube appears to have been advanced such that its tip overlies the posterolateral interspace between the fifth and sixth ribs. A new left chest tube is present whose tip projects over the posterior aspect of the left 6th rib. The endotracheal tube tip lies approximately 2 cm above the carina. The feeding tube tip projects below the inferior margin of the image. The right ventricular assist device has been removed. The Swan-Ganz catheter tip is stable in the main pulmonary outflow tract. The prosthetic aortic valve is stable in position. The left subclavian venous catheter tip projects over the proximal SVC. IMPRESSION: Status post removal of the right ventricular assist device. Repositioning of the right chest tube. New left chest tube. No pneumothorax. CHF with mild interstitial edema. The remaining support tubes are in reasonable position. Electronically Signed   By: David  Martinique M.D.   On: 07/17/2017 11:21   Dg Chest Port 1 View  Result Date: 07/17/2017 CLINICAL DATA:  Status post CABG on July 10, 2017 EXAM: PORTABLE CHEST 1 VIEW COMPARISON:  Portable chest x-ray of July 16, 2017 FINDINGS: The lungs are mildly hypoinflated greatest on the right. The interstitial markings are coarse throughout much of the left lung and at the right lung base. The cardiac silhouette is enlarged. There is no pneumothorax nor large pleural effusion. The pulmonary vascularity is prominent centrally. There is a prosthetic aortic valve. The Swan-Ganz  catheter tip projects in the pulmonary outflow tract. Again demonstrated are the large caliber catheter is present. One on the right appears to traverse the IVC and terminate in the upper  SVC. The 2nd large bore tube be an arterial catheter terminating in the mid thoracic aorta. The endotracheal tube tip lies 2.6 cm above the carina. The feeding tube tip projects below the inferior margin of the image. The large caliber right sided chest tube tip projects over the posterior aspect of the right fourth rib. IMPRESSION: Improved aeration of the left lung. Persistent mild interstitial prominence throughout the mid and lower left lung and at the right lung base. The support tubes are in reasonable position radiographically. Electronically Signed   By: David  Martinique M.D.   On: 07/17/2017 07:35    Medications:    Scheduled Medications: . [START ON 07/18/2017] acetaminophen  1,000 mg Oral Q6H   Or  . [START ON 07/18/2017] acetaminophen (TYLENOL) oral liquid 160 mg/5 mL  1,000 mg Per Tube Q6H  . acetaminophen (TYLENOL) oral liquid 160 mg/5 mL  650 mg Per Tube Once   Or  . acetaminophen  650 mg Rectal Once  . [START ON 07/18/2017] aspirin EC  325 mg Oral Daily   Or  . [START ON 07/18/2017] aspirin  324 mg Per Tube Daily  . [START ON 07/18/2017] bisacodyl  10 mg Oral Daily   Or  . [START ON 07/18/2017] bisacodyl  10 mg Rectal Daily  . [START ON 07/18/2017] docusate sodium  200 mg Oral Daily  . furosemide  20 mg Intravenous Once  . insulin regular  0-10 Units Intravenous TID WC  . metoprolol tartrate  12.5 mg Oral BID   Or  . metoprolol tartrate  12.5 mg Per Tube BID  . pantoprazole (PROTONIX) IV  40 mg Intravenous Q12H  . [START ON 07/18/2017] sodium chloride flush  3 mL Intravenous Q12H    Infusions: . sodium chloride 20 mL/hr at 07/17/17 1400  . [START ON 07/18/2017] sodium chloride    . sodium chloride 20 mL/hr at 07/17/17 1400  . albumin human    . amiodarone 30 mg/hr (07/17/17 1400)  . cefUROXime  (ZINACEF)  IV    . dexmedetomidine (PRECEDEX) IV infusion 0.7 mcg/kg/hr (07/17/17 1307)  . EPINEPHrine 4 mg in dextrose 5% 250 mL infusion (16 mcg/mL) 7 mcg/min (07/17/17 1400)  . famotidine (PEPCID) IV Stopped (07/17/17 1406)  . fentaNYL infusion INTRAVENOUS 300 mcg/hr (07/17/17 1400)  . insulin (NOVOLIN-R) infusion 2.2 Units/hr (07/17/17 1400)  . lactated ringers    . lactated ringers Stopped (07/17/17 1200)  . lactated ringers 20 mL/hr at 07/17/17 1400  . magnesium sulfate 4 g (07/17/17 1230)  . milrinone 0.25 mcg/kg/min (07/17/17 1300)  . nitroGLYCERIN    . norepinephrine (LEVOPHED) Adult infusion 8 mcg/min (07/17/17 1300)  . phenylephrine (NEO-SYNEPHRINE) Adult infusion    . potassium chloride Stopped (07/17/17 1315)  . vancomycin      PRN Medications: sodium chloride, albumin human, lactated ringers, metoprolol tartrate, midazolam, morphine injection, ondansetron (ZOFRAN) IV, oxyCODONE, [START ON 07/18/2017] sodium chloride flush    Patient Profile   [name] is a 77 y.o. female with history of fusiform ascending aortic aneurysm, HTN, and HLD.   Admitted 07/10/17 for planned AVR and replacement of ascending aortic aneurysm. Developed acute cardiogenic shock + VF arrest. Taken back to OR am of 07/11/17 for evacuation of hematoma and placement of RVAD.   Assessment/Plan   1. Cardiogenic shock s/p cardiac surgery with R>L CHF. - Pre-op Echo 06/02/2017 LVEF 55-60%, Grade 1 DD, Mild/Mod AI with LVOT, Mid ascending aortic diameter 48.88 mm, Mild LAE, RV normal - S/P RVAD removal today  -  On epi 8 mcg, NE 5 mcg, milrinone 0.25 mcg, amio 30 mg per our  - CVP 8 . Per CT surgery .  -2. VF arrest - Stable. No further arrythmia.  - Goal K > 4.0 and Mg > 2.0. Supp aggressively -mag replaced earlier today.  Check mag  3. Atrial fibrillation - Remains in NSR on IV amiodarone. - Follow electrolytes 4. Acute respiratory failure - Intubated and sedated. - Vent settings stable   Managed by CCM CXR ok.  5. S/p AVR and Ascending aortic aneurysm replacement. - Per surgery. No change. 6. AKI Creatinine 0.5 7. Anemia  Hgb 10.1.     Length of Stay: [place], NP  2:44 PM Advanced Heart Failure Team Pager [phone] (M-F; 7a - 4p)  Please contact [facility] for night-coverage after hours (4p -7a ) and weekends on [email or web address]   Agree with above.   She is back from the OR after RVAD removal and chest closure. Hemodynamically stable on epi 8, levophed 8, milrinone 0.25 CI = 2,CVP 9, PA 25/10. Good pulsativity in PA line. Volume overloaded.   Intubated/sedated RIJ swan Cor RRR  Sternal dressing ok Lungs + mechanical BS Ab obese NT quiet Ext 2-3+ edema.   Stable immediately post-op RVAD removal. Indices look good. Continue pressors. Start gentle diuresis.   CRITICAL CARE Performed by: Glori Bickers  Total critical care time: 35 minutes  Critical care time was exclusive of separately billable procedures and treating other patients.  Critical care was necessary to treat or prevent imminent or life-threatening deterioration.  Critical care was time spent personally by me (independent of midlevel providers or residents) on the following activities: development of treatment plan with patient and/or surrogate as well as nursing, discussions with consultants, evaluation of patient's response to treatment, examination of patient, obtaining history from patient or surrogate, ordering and performing treatments and interventions, ordering and review of laboratory studies, ordering and review of radiographic studies, pulse oximetry and re-evaluation of patient's condition.  Glori Bickers, MD  6:08 PM

## 2017-07-17 NOTE — Progress Notes (Signed)
30cc of 1mg /73ml Versed wasted down sink with Candyce Churn, RN.

## 2017-07-17 NOTE — Anesthesia Postprocedure Evaluation (Signed)
Anesthesia Post Note  Patient: [name]  Procedure(s) Performed: REMOVAL OF RVAD WITH PUMP STANDBY (N/A Chest) TRANSESOPHAGEAL ECHOCARDIOGRAM (TEE) (N/A )     Patient location during evaluation: SICU Anesthesia Type: General Level of consciousness: sedated Pain management: pain level controlled Vital Signs Assessment: post-procedure vital signs reviewed and stable Respiratory status: patient remains intubated per anesthesia plan Cardiovascular status: stable Postop Assessment: no apparent nausea or vomiting Anesthetic complications: no    Last Vitals:  Vitals:   07/17/17 1400 07/17/17 1500  BP: (!) 85/55 126/65  Pulse: 94 91  Resp: 15 (!) 0  Temp: 36.7 C 36.8 C  SpO2: 99% 98%    Last Pain:  Vitals:   07/17/17 1200  TempSrc: Core (Comment)  PainSc:                  [name]

## 2017-07-17 NOTE — Plan of Care (Signed)
  No Outcome Cardiac: Hemodynamic stability will improve 07/17/2017 1527 by Netta Corrigan, RN Note Post RVAD removal, patients hemodynamics have remained stable.

## 2017-07-17 NOTE — Progress Notes (Signed)
  Echocardiogram Echocardiogram Transesophageal has been performed.  [name] 07/17/2017, 9:07 AM

## 2017-07-17 NOTE — Progress Notes (Signed)
Handoff to CRNA and OR team, pt left 2H05 to return to OR

## 2017-07-17 NOTE — Progress Notes (Signed)
Pre Procedure note for inpatients:  Removal OF RVAD [Centrimag] Sternal closure   [name] has been scheduled for today. The various methods of treatment have been discussed with the patient. After consideration of the risks, benefits and treatment options the patient has consented to the planned procedure.   The patient has been seen and labs reviewed. There are no changes in the patient's condition to prevent proceeding with the planned procedure today.  Recent labs:  Lab Results  Component Value Date   WBC 12.5 (H) 07/17/2017   HGB 7.8 (L) 07/17/2017   HCT 23.0 (L) 07/17/2017   PLT 153 07/17/2017   GLUCOSE 126 (H) 07/17/2017   CHOL 165 03/15/2017   TRIG 93 03/15/2017   HDL 57 03/15/2017   LDLCALC 89 03/15/2017   ALT 25 07/15/2017   AST 34 07/15/2017   NA 138 07/17/2017   K 4.2 07/17/2017   CL 100 (L) 07/17/2017   CREATININE 0.57 07/17/2017   BUN 8 07/17/2017   CO2 28 07/17/2017   TSH 0.990 03/15/2017   INR 1.71 07/11/2017   HGBA1C 5.2 07/06/2017    Ivin Poot III, MD 07/17/2017 7:09 AM

## 2017-07-17 NOTE — OR Nursing (Signed)
2 keepers was removed during surgery today.  Those keepers was left from previous surgery per protocol.

## 2017-07-17 NOTE — Transfer of Care (Signed)
Immediate Anesthesia Transfer of Care Note  Patient: ARRIELLE MCGINN  Procedure(s) Performed: REMOVAL OF RVAD WITH PUMP STANDBY (N/A Chest) TRANSESOPHAGEAL ECHOCARDIOGRAM (TEE) (N/A )  Patient Location: PACU  Anesthesia Type:General  Level of Consciousness: Patient remains intubated per anesthesia plan  Airway & Oxygen Therapy: Patient remains intubated per anesthesia plan and Patient placed on Ventilator (see vital sign flow sheet for setting)  Post-op Assessment: Report given to RN and Post -op Vital signs reviewed and stable  Post vital signs: Reviewed and stable  Last Vitals:  Vitals:   07/17/17 0700 07/17/17 0715  BP: (!) 96/59 (!) 116/38  Pulse: (!) 101 98  Resp: 17 15  Temp: 37.5 C   SpO2: 97% 97%    Last Pain:  Vitals:   07/17/17 0515  TempSrc: Core  PainSc:       Patients Stated Pain Goal: 2 (61/53/79 4327)  Complications: No apparent anesthesia complications

## 2017-07-17 NOTE — Progress Notes (Signed)
.. ..  Name: [name] MRN: [id number] DOB: 1941-04-05    ADMISSION DATE:  07/10/2017  CONSULTATION DATE:  07/12/17  REFERRING MD :  Nils Pyle MD  CHIEF COMPLAINT:   BRIEF PATIENT DESCRIPTION: SIGNIFICANT EVENTS  77 yr old female POD 1 TEE, Open Heart with evacuation of Hematoma and placement of RVAD.  STUDIES: Echo12/12/2016 LVEF 55-60%, Grade 1 DD, Mild/Mod AI with LVOT, Mid ascending aortic diameter 48.88 mm, Mild LAE, RV normal    BRIEF:   77 year old female with past medical history significant for fusiform ascending aortic aneurysm, hypertension, hyperlipidemia, status post AVR and replacement of ascending aortic aneurysm.  On 07/10/2017 postprocedure patient developed acute cardiogenic shock had an episode of V. fib arrest was taken back to the OR on 07/11/2017 for evacuation of hematoma and placement of RVAD.  PCCM consulted for ventilator management.   Events - 07/10/17 Admit - 07/10/17 S/p AVR and ascending AA replacement  - 07/10/17 VF Arrest in evening with shock x 1 - 07/11/17 to OR for evacuation of hematoma and RVAD placement  - 07/12/17 PCCM consult 07/15/2017 Lasix per cardiology CVP of 5   SUBJECTIVE/OVERNIGHT/INTERVAL HX Weaning vasoactive drips  VITAL SIGNS: Temp:  [99.1 F (37.3 C)-100.8 F (38.2 C)] 99.5 F (37.5 C) (01/21 0700) Pulse Rate:  [96-119] 98 (01/21 0715) Resp:  [7-26] 15 (01/21 0715) BP: (81-123)/(38-89) 116/38 (01/21 0715) SpO2:  [90 %-100 %] 97 % (01/21 0715) Arterial Line BP: (100-161)/(39-57) 138/45 (01/21 0700) FiO2 (%):  [40 %] 40 % (01/21 0715) Weight:  [208 lb 8.9 oz (94.6 kg)] 208 lb 8.9 oz (94.6 kg) (01/21 0500)  PHYSICAL EXAMINATION:  General: This is a 77 year old female currently sedated on ventilator closure of sternum and removal of right ventricular assist device HEENT: Orally intubated no jugular venous distention Pulmonary: Clear to auscultation equal chest rise chest tubes anteriorly placed in unremarkable with  serous sanguinous drainage no air leak Cardiac: Sternum dressing intact regular rate and rhythm Abdomen: Soft nontender no organomegaly hypoactive bowel sounds Extremities: Diffuse anasarca warm, dry, brisk cap refill Neuro: Sedated on ventilator   PULMONARY Recent Labs  Lab 07/15/17 0815  07/16/17 0425  07/16/17 1700 07/17/17 0359 07/17/17 0410  07/17/17 0816 07/17/17 0850 07/17/17 0928 07/17/17 0932 07/17/17 1049  PHART 7.529*  --  7.411  --   --  7.458*  --   --  7.436  --   --  7.438  --   PCO2ART 37.4  --  45.5  --   --  38.2  --   --  41.1  --   --  40.4  --   PO2ART 71.0*  --  78.0*  --   --  70.0*  --   --  133.0*  --   --  139.0*  --   HCO3 31.4*  --  28.8*  --   --  26.8  --   --  27.7  --   --  27.3  --   TCO2 33*   < > 30   < >  --  28  --    < > 29 27 26 29 28   O2SAT 96.0   < > 95.0  --  64.2 94.0 52.4  --  99.0  --   --  99.0  --    < > = values in this interval not displayed.    [place] Recent Labs  Lab 07/15/17 1539  07/16/17 0233  07/17/17 0346  07/17/17 1740  07/17/17 0928 07/17/17 1049  HGB 8.4*   < > 8.0*   < > 7.8*   < > 8.2* 9.2* 7.5*  HCT 25.2*   < > 24.7*   < > 23.0*   < > 24.0* 27.0* 22.0*  WBC 10.1  --  11.0*  --  12.5*  --   --   --   --   PLT 112*  --  125*  --  153  --   --   --   --    < > = values in this interval not displayed.    COAGULATION Recent Labs  Lab 07/10/17 1500 07/11/17 0944 07/11/17 1037 07/11/17 1259 07/11/17 1552  INR 1.67 1.76 1.73 1.85 1.71    CARDIAC  No results for input(s): TROPONINI in the last 168 hours. No results for input(s): PROBNP in the last 168 hours.   CHEMISTRY Recent Labs  Lab 07/12/17 1700  07/13/17 2331 07/14/17 0217  07/15/17 0231  07/16/17 8676  07/17/17 0346 07/17/17 0813 07/17/17 0850 07/17/17 0928 07/17/17 1049  NA  --    < > 136 135   < > 134*   < > 134*   < > 138 134* 134* 135 136  K  --    < > 3.9 3.8   < > 3.7   < > 4.2   < > 4.2 3.5 3.5 3.7 3.4*  CL  --    < > 97* 96*   <  > 95*   < > 98*   < > 100* 93* 94* 96* 96*  CO2  --    < > 27 28  --  29  --  26  --  28  --   --   --   --   GLUCOSE  --    < > 156* 178*   < > 108*   < > 175*   < > 126* 125* 136* 142* 113*  BUN  --    < > 15 14   < > 12   < > 10   < > 8 8 8 8 8   CREATININE 0.92   < > 0.78 0.75   < > 0.62   < > 0.58   < > 0.57 0.50 0.40* 0.50 0.50  CALCIUM  --    < > 7.8* 7.8*  --  8.9  --  8.7*  --  8.0*  --   --   --   --   MG 1.7  --   --  1.5*  --  1.5*  --  1.5*  --  1.3*  --   --   --   --   PHOS  --   --   --  4.1  --  4.5  --  5.6*  --  3.8  --   --   --   --    < > = values in this interval not displayed.   Estimated Creatinine Clearance: 69.1 mL/min (by C-G formula based on SCr of 0.5 mg/dL).   LIVER Recent Labs  Lab 07/10/17 1500  07/11/17 0944 07/11/17 1037 07/11/17 1259 07/11/17 1552 07/12/17 0323 07/13/17 0405 07/14/17 0217 07/15/17 0231  AST  --    < > 151* 150*  --   --  110* 77* 49* 34  ALT  --    < > 69* 70*  --   --  50 41 32 25  ALKPHOS  --    < >  46 46  --   --  40 50 61 73  BILITOT  --    < > 2.0* 2.0*  --   --  1.3* 1.7* 1.0 1.1  PROT  --    < > 4.2* 4.2*  --   --  3.9* 4.5* 4.7* 4.4*  ALBUMIN  --    < > 3.1* 3.1*  --   --  2.9* 2.9* 2.6* 2.3*  INR 1.67  --  1.76 1.73 1.85 1.71  --   --   --   --    < > = values in this interval not displayed.    INFECTIOUS Recent Labs  Lab 07/13/17 2331 07/15/17 0231 07/16/17 0233  LATICACIDVEN 1.6  --   --   PROCALCITON  --  0.67 0.50    ENDOCRINE CBG (last 3)  Recent Labs    07/16/17 1944 07/16/17 2349 07/17/17 0356  GLUCAP 169* 169* 132*     IMAGING x48h  - image(s) personally visualized  -   highlighted in bold Dg Chest 1 View  Result Date: 07/17/2017 CLINICAL DATA:  Status post removal of right ventricular assist device. Evaluate for pneumothorax. EXAM: CHEST 1 VIEW COMPARISON:  Portable chest x-ray of earlier today FINDINGS: The lungs are hypoinflated. There is no pneumothorax or significant pleural effusion.  The interstitial markings remain increased. The cardiac silhouette is enlarged. The pulmonary vascularity is slightly less prominent than on the earlier study today. The right chest tube appears to have been advanced such that its tip overlies the posterolateral interspace between the fifth and sixth ribs. A new left chest tube is present whose tip projects over the posterior aspect of the left 6th rib. The endotracheal tube tip lies approximately 2 cm above the carina. The feeding tube tip projects below the inferior margin of the image. The right ventricular assist device has been removed. The Swan-Ganz catheter tip is stable in the main pulmonary outflow tract. The prosthetic aortic valve is stable in position. The left subclavian venous catheter tip projects over the proximal SVC. IMPRESSION: Status post removal of the right ventricular assist device. Repositioning of the right chest tube. New left chest tube. No pneumothorax. CHF with mild interstitial edema. The remaining support tubes are in reasonable position. Electronically Signed   By: David  Martinique M.D.   On: 07/17/2017 11:21   Dg Chest Port 1 View  Result Date: 07/17/2017 CLINICAL DATA:  Status post CABG on July 10, 2017 EXAM: PORTABLE CHEST 1 VIEW COMPARISON:  Portable chest x-ray of July 16, 2017 FINDINGS: The lungs are mildly hypoinflated greatest on the right. The interstitial markings are coarse throughout much of the left lung and at the right lung base. The cardiac silhouette is enlarged. There is no pneumothorax nor large pleural effusion. The pulmonary vascularity is prominent centrally. There is a prosthetic aortic valve. The Swan-Ganz catheter tip projects in the pulmonary outflow tract. Again demonstrated are the large caliber catheter is present. One on the right appears to traverse the IVC and terminate in the upper SVC. The 2nd large bore tube be an arterial catheter terminating in the mid thoracic aorta. The endotracheal tube  tip lies 2.6 cm above the carina. The feeding tube tip projects below the inferior margin of the image. The large caliber right sided chest tube tip projects over the posterior aspect of the right fourth rib. IMPRESSION: Improved aeration of the left lung. Persistent mild interstitial prominence throughout the mid and lower left lung and  at the right lung base. The support tubes are in reasonable position radiographically. Electronically Signed   By: David  Martinique M.D.   On: 07/17/2017 07:35   Dg Chest Port 1 View  Result Date: 07/16/2017 CLINICAL DATA:  77 year old female on right ventricular assist device. EXAM: PORTABLE CHEST 1 VIEW COMPARISON:  Chest x-ray 07/15/2017. FINDINGS: Patient is intubated with the tip of the endotracheal to 2.2 cm above the carina. Small bore feeding tube extends into the abdomen (tip below the lower margin of the image). Right internal jugular [place] through which a Swan-Ganz catheter has been passed into the proximal left main pulmonary artery. Large bore venous cannula extending cephalad from the IVC into the right atrium and ultimately extending into the superior vena cava. A second large bore cannula likely placed directly into the right ventricle with tip in the pulmonic trunk. Skin staples near the midline and in the right axillary region. Bioprosthetic aortic valve. Lung volumes are low. Patchy ill-defined airspace disease noted throughout the left mid to lower lung. Bibasilar opacities compatible with areas of subsegmental atelectasis. Moderate left pleural effusion. No pneumothorax. No evidence of pulmonary edema. Cardiopericardial silhouette is enlarged. The patient is rotated to the right on today's exam, resulting in distortion of the mediastinal contours and reduced diagnostic sensitivity and specificity for mediastinal pathology. IMPRESSION: 1. Postoperative changes and support apparatus, as above. 2. Low lung volumes with bibasilar atelectasis, as well as widespread  airspace consolidation throughout the left mid to lower lung and moderate left pleural effusion. Electronically Signed   By: Vinnie Langton M.D.   On: 07/16/2017 07:49   Dg Abd Portable 1v  Result Date: 07/16/2017 CLINICAL DATA:  77 year old female on right ventricular assist device. Evaluate for ileus. EXAM: PORTABLE ABDOMEN - 1 VIEW COMPARISON:  Abdominal radiograph 05/15/2018. FINDINGS: Small bore feeding tube noted with tip in the antral pre-pyloric region of the stomach. Large-bore cannula extending from the right femoral region into the IVC with tip extending above the upper margin of the image. Second large-bore cannula with tip extending toward the heart ( also extending beyond the upper margin of the image). Epicardial pacing wires are noted. Gas and stool are noted throughout the colon extending to the level of the distal rectum. No pathologic dilatation of small bowel or colon. No definite pneumoperitoneum identified on these supine images. Midline skin staples in the epigastric region extending cephalad into the thorax. Surgical clips project over the right upper quadrant of the abdomen from prior cholecystectomy. IMPRESSION: 1. Support apparatus and postoperative changes, as above. 2. Nonobstructive bowel gas pattern. No pneumoperitoneum. Bowel gas pattern does not suggest ileus. Electronically Signed   By: Vinnie Langton M.D.   On: 07/16/2017 07:52     ASSESSMENT / PLAN: 77 year old female with PMHx significant for fusiform ascending aortic aneurysm, hypertension, hyperlipidemia, status post AVR and replacement of ascending aortic aneurysm.  On 07/10/2017 postprocedure patient developed acute cardiogenic shock had an episode of V. fib arrest was taken back to the OR on 07/11/2017 for evacuation of hematoma and placement of RVAD.  [place] now removed, weaning pressors, hopefully initiate weaning soon   ASSESSMENT / PLAN:   Cardiogenic shock s/p removal of RVAD 1/21; sternum now  closed Atrial fibrillation per tele Remains on multiple pressors Plan Continue to wean epinephrine and vasoactive drips per thoracic surgery and heart failure team Day 1 zinacef day 8 vanc (per CVTS) Chest tube management per thoracic's  Acute respiratory failure in setting of cardiogenic shock  Chest x-ray reviewed:Endotracheal tubes and chest tubes in satisfactory position.  No pneumothorax, pulmonary edema. plan Continuing full ventilator support PAD protocol RASS goal -2 Repeat chest x-ray in a.m. Hopefully initiate weaning protocol when hemodynamically will tolerate  Hypokalemia and hyperchloremia  Plan Replace and recheck  anemia of critical illness Thrombocytopenia  Plan Trend CBC transfuse per protocol   Hyperglycemia  Plan ssi     FAMILY  - Updates 07/16/2017 no family at bedside  - Inter-disciplinary family meet or Palliative Care meeting due by:  DAy 7. Current LOS is LOS 7 days  P CCM adding very little at this point.  DISPO  DVT prophylaxis:  SUP: PPI  Diet: scd Activity: BR Disposition : ICU   Erick Colace ACNP-BC [place] Pager # [phone] OR # [phone] if no answer  Attending Note:  77 year old female with PMH above who presents to PCCM with the concern for respiratory failure and cardiogenic shock post cardiac arrest and hematoma evacuation.  On exam, patient is sedate with coarse BS diffusely.  I reviewed CXR myself, ETT ok and pulmonary edema noted.  Discussed with PCCM-NP.  Will continue full vent support for now given hemodynamics.  Weaning pressors as able.  Start cefepime.  PCCM will continue to follow.  The patient is critically ill with multiple organ systems failure and requires high complexity decision making for assessment and support, frequent evaluation and titration of therapies, application of advanced monitoring technologies and extensive interpretation of multiple databases.   Critical Care Time devoted  to patient care services described in this note is  35  Minutes. This time reflects time of care of this signee Dr Jennet Maduro. This critical care time does not reflect procedure time, or teaching time or supervisory time of PA/NP/Med student/Med Resident etc but could involve care discussion time.  Rush Farmer, M.D. [facility]/Critical Care Medicine. Pager: [phone]. After hours pager: [phone].

## 2017-07-17 NOTE — Anesthesia Procedure Notes (Signed)
Arterial Line Insertion Start/End1/21/2019 7:51 AM, 07/17/2017 8:02 AM Performed by: Duane Boston, MD  Patient location: OR. Preanesthetic checklist: patient identified, IV checked, site marked, risks and benefits discussed, surgical consent, monitors and equipment checked, pre-op evaluation and timeout performed Right, brachial was placed Catheter size: 20 G Hand hygiene performed , maximum sterile barriers used  and Seldinger technique used  Attempts: 1 Procedure performed using ultrasound guided technique. Ultrasound Notes:anatomy identified, needle tip was noted to be adjacent to the nerve/plexus identified, no ultrasound evidence of intravascular and/or intraneural injection and image(s) printed for medical record Following insertion, line sutured, dressing applied and Biopatch. Post procedure assessment: normal  Patient tolerated the procedure well with no immediate complications.

## 2017-07-17 NOTE — Brief Op Note (Signed)
07/17/2017  11:33 AM  PATIENT:  [name]  77 y.o. female  PRE-OPERATIVE DIAGNOSIS:  RV FAILURE  POST-OPERATIVE DIAGNOSIS:  RV FAILURE  PROCEDURE:  Procedure(s): REMOVAL OF RVAD WITH PUMP STANDBY (N/A) TRANSESOPHAGEAL ECHOCARDIOGRAM (TEE) (N/A)  Chest Closure  SURGEON:  Surgeon(s) and Role:    Ivin Poot, MD - Primary  PHYSICIAN ASSISTANT: 0  ASSISTANTS: TOW RNFA   ANESTHESIA:   general  EBL:  70 cc  BLOOD ADMINISTERED:none and 2 PRBC CC PRBC  DRAINS: 3 chest tubes  LOCAL MEDICATIONS USED:  NONE  SPECIMEN:  No Specimen  DISPOSITION OF SPECIMEN:  N/A  COUNTS:  YES  TOURNIQUET:  * No tourniquets in log *  DICTATION: .Dragon Dictation  PLAN OF CARE: return to 2 H  PATIENT DISPOSITION:  ICU - intubated and hemodynamically stable.   Delay start of Pharmacological VTE agent (>24hrs) due to surgical blood loss or risk of bleeding: yes

## 2017-07-18 ENCOUNTER — Encounter (HOSPITAL_COMMUNITY): Payer: Self-pay | Admitting: Cardiothoracic Surgery

## 2017-07-18 ENCOUNTER — Inpatient Hospital Stay (HOSPITAL_COMMUNITY): Payer: Medicare Other

## 2017-07-18 LAB — CBC
HCT: 28.8 % — ABNORMAL LOW (ref 36.0–46.0)
HCT: 26.6 % — ABNORMAL LOW (ref 36.0–46.0)
Hemoglobin: 9.5 g/dL — ABNORMAL LOW (ref 12.0–15.0)
Hemoglobin: 9 g/dL — ABNORMAL LOW (ref 12.0–15.0)
MCH: 29 pg (ref 26.0–34.0)
MCH: 29.9 pg (ref 26.0–34.0)
MCHC: 33 g/dL (ref 30.0–36.0)
MCHC: 33.8 g/dL (ref 30.0–36.0)
MCV: 87.8 fL (ref 78.0–100.0)
MCV: 88.4 fL (ref 78.0–100.0)
Platelets: 198 10*3/uL (ref 150–400)
Platelets: 207 10*3/uL (ref 150–400)
RBC: 3.28 MIL/uL — ABNORMAL LOW (ref 3.87–5.11)
RBC: 3.01 MIL/uL — ABNORMAL LOW (ref 3.87–5.11)
RDW: 15.5 % (ref 11.5–15.5)
RDW: 16.2 % — ABNORMAL HIGH (ref 11.5–15.5)
WBC: 24.1 10*3/uL — ABNORMAL HIGH (ref 4.0–10.5)
WBC: 17.1 10*3/uL — ABNORMAL HIGH (ref 4.0–10.5)

## 2017-07-18 LAB — TYPE AND SCREEN
ABO/RH(D): A POS
Antibody Screen: NEGATIVE
Unit division: 0
Unit division: 0
Unit division: 0
Unit division: 0

## 2017-07-18 LAB — POCT I-STAT, CHEM 8
BUN: 15 mg/dL (ref 6–20)
Calcium, Ion: 0.97 mmol/L — ABNORMAL LOW (ref 1.15–1.40)
Chloride: 97 mmol/L — ABNORMAL LOW (ref 101–111)
Creatinine, Ser: 0.6 mg/dL (ref 0.44–1.00)
Glucose, Bld: 127 mg/dL — ABNORMAL HIGH (ref 65–99)
HCT: 26 % — ABNORMAL LOW (ref 36.0–46.0)
Hemoglobin: 8.8 g/dL — ABNORMAL LOW (ref 12.0–15.0)
Potassium: 3.7 mmol/L (ref 3.5–5.1)
Sodium: 137 mmol/L (ref 135–145)
TCO2: 25 mmol/L (ref 22–32)

## 2017-07-18 LAB — COOXEMETRY PANEL
Carboxyhemoglobin: 1.1 % (ref 0.5–1.5)
Methemoglobin: 1.3 % (ref 0.0–1.5)
O2 Saturation: 55.2 %
Total hemoglobin: 9.1 g/dL — ABNORMAL LOW (ref 12.0–16.0)

## 2017-07-18 LAB — BASIC METABOLIC PANEL
Anion gap: 10 (ref 5–15)
BUN: 13 mg/dL (ref 6–20)
CO2: 23 mmol/L (ref 22–32)
Calcium: 7.4 mg/dL — ABNORMAL LOW (ref 8.9–10.3)
Chloride: 101 mmol/L (ref 101–111)
Creatinine, Ser: 0.59 mg/dL (ref 0.44–1.00)
GFR calc Af Amer: >60 mL/min (ref >60)
GFR calc non Af Amer: >60 mL/min (ref >60)
Glucose, Bld: 101 mg/dL — ABNORMAL HIGH (ref 65–99)
Potassium: 4.1 mmol/L (ref 3.5–5.1)
Sodium: 134 mmol/L — ABNORMAL LOW (ref 135–145)

## 2017-07-18 LAB — GLUCOSE, CAPILLARY
Glucose-Capillary: 105 mg/dL — ABNORMAL HIGH (ref 65–99)
Glucose-Capillary: 106 mg/dL — ABNORMAL HIGH (ref 65–99)
Glucose-Capillary: 107 mg/dL — ABNORMAL HIGH (ref 65–99)
Glucose-Capillary: 108 mg/dL — ABNORMAL HIGH (ref 65–99)
Glucose-Capillary: 110 mg/dL — ABNORMAL HIGH (ref 65–99)
Glucose-Capillary: 111 mg/dL — ABNORMAL HIGH (ref 65–99)
Glucose-Capillary: 111 mg/dL — ABNORMAL HIGH (ref 65–99)
Glucose-Capillary: 118 mg/dL — ABNORMAL HIGH (ref 65–99)
Glucose-Capillary: 119 mg/dL — ABNORMAL HIGH (ref 65–99)
Glucose-Capillary: 123 mg/dL — ABNORMAL HIGH (ref 65–99)
Glucose-Capillary: 134 mg/dL — ABNORMAL HIGH (ref 65–99)
Glucose-Capillary: 142 mg/dL — ABNORMAL HIGH (ref 65–99)
Glucose-Capillary: 156 mg/dL — ABNORMAL HIGH (ref 65–99)
Glucose-Capillary: 97 mg/dL (ref 65–99)
Glucose-Capillary: 97 mg/dL (ref 65–99)

## 2017-07-18 LAB — POCT I-STAT 3, ART BLOOD GAS (G3+)
Acid-base deficit: 1 mmol/L (ref 0.0–2.0)
Bicarbonate: 23 mmol/L (ref 20.0–28.0)
O2 Saturation: 92 %
Patient temperature: 37.2
TCO2: 24 mmol/L (ref 22–32)
pCO2 arterial: 37.2 mmHg (ref 32.0–48.0)
pH, Arterial: 7.4 (ref 7.350–7.450)
pO2, Arterial: 64 mmHg — ABNORMAL LOW (ref 83.0–108.0)

## 2017-07-18 LAB — BPAM RBC
Blood Product Expiration Date: 201901282359
Blood Product Expiration Date: 201901282359
Blood Product Expiration Date: 201902062359
Blood Product Expiration Date: 201902062359
ISSUE DATE / TIME: 201901201152
ISSUE DATE / TIME: 201901210507
ISSUE DATE / TIME: 201901210811
ISSUE DATE / TIME: 201901210811
Unit Type and Rh: 6200
Unit Type and Rh: 6200
Unit Type and Rh: 6200
Unit Type and Rh: 6200

## 2017-07-18 LAB — CREATININE, SERUM
Creatinine, Ser: 0.69 mg/dL (ref 0.44–1.00)
GFR calc Af Amer: >60 mL/min (ref >60)
GFR calc non Af Amer: >60 mL/min (ref >60)

## 2017-07-18 LAB — MAGNESIUM
Magnesium: 2 mg/dL (ref 1.7–2.4)
Magnesium: 1.7 mg/dL (ref 1.7–2.4)

## 2017-07-18 LAB — CALCIUM, IONIZED: Calcium, Ionized, Serum: 4.8 mg/dL (ref 4.5–5.6)

## 2017-07-18 MED ORDER — CHLORHEXIDINE GLUCONATE CLOTH 2 % EX PADS
6.0000 | MEDICATED_PAD | Freq: Every day | CUTANEOUS | Status: DC
  Administered 2017-07-18 – 2017-08-10 (×22): 6 via TOPICAL

## 2017-07-18 MED ORDER — SODIUM CHLORIDE 0.9% FLUSH: 10.0000 mL | INTRAVENOUS | Status: DC | PRN

## 2017-07-18 MED ORDER — LATANOPROST 0.005 % OP SOLN
1.0000 [drp] | Freq: Every day | OPHTHALMIC | Status: DC
  Administered 2017-07-18: 1 [drp] via OPHTHALMIC
  Filled 2017-07-18: qty 2.5

## 2017-07-18 MED ORDER — FUROSEMIDE 10 MG/ML IJ SOLN: 20.0000 mg | Freq: Three times a day (TID) | INTRAMUSCULAR | Status: DC

## 2017-07-18 MED ORDER — CHLORHEXIDINE GLUCONATE CLOTH 2 % EX PADS: 6.0000 | MEDICATED_PAD | Freq: Every day | CUTANEOUS | Status: DC

## 2017-07-18 MED ORDER — INSULIN ASPART 100 UNIT/ML ~~LOC~~ SOLN
0.0000 [IU] | SUBCUTANEOUS | Status: DC
  Administered 2017-07-18 – 2017-07-21 (×7): 2 [IU] via SUBCUTANEOUS
  Administered 2017-07-21 (×2): 4 [IU] via SUBCUTANEOUS
  Administered 2017-07-21 – 2017-07-24 (×9): 2 [IU] via SUBCUTANEOUS
  Administered 2017-07-25 (×2): 4 [IU] via SUBCUTANEOUS
  Administered 2017-07-25: 2 [IU] via SUBCUTANEOUS
  Administered 2017-07-26 – 2017-07-27 (×3): 4 [IU] via SUBCUTANEOUS
  Administered 2017-07-27 – 2017-08-03 (×15): 2 [IU] via SUBCUTANEOUS
  Administered 2017-08-04: 4 [IU] via SUBCUTANEOUS
  Administered 2017-08-05 – 2017-08-11 (×8): 2 [IU] via SUBCUTANEOUS
  Administered 2017-08-12: 4 [IU] via SUBCUTANEOUS

## 2017-07-18 MED ORDER — FENTANYL CITRATE (PF) 100 MCG/2ML IJ SOLN
50.0000 ug | INTRAMUSCULAR | Status: DC | PRN
  Administered 2017-07-18 – 2017-07-25 (×11): 50 ug via INTRAVENOUS
  Filled 2017-07-18 (×5): qty 2

## 2017-07-18 MED ORDER — SODIUM CHLORIDE 0.9% FLUSH
10.0000 mL | Freq: Two times a day (BID) | INTRAVENOUS | Status: DC
  Administered 2017-07-18 – 2017-07-21 (×6): 10 mL
  Administered 2017-07-22: 20 mL
  Administered 2017-07-22 – 2017-07-23 (×3): 10 mL
  Administered 2017-07-25: 20 mL
  Administered 2017-07-26 – 2017-08-03 (×17): 10 mL
  Administered 2017-08-04: 20 mL
  Administered 2017-08-04 – 2017-08-12 (×11): 10 mL

## 2017-07-18 MED ORDER — VENLAFAXINE HCL ER 75 MG PO CP24
75.0000 mg | ORAL_CAPSULE | Freq: Every day | ORAL | Status: DC
Start: 2017-07-18 — End: 2017-08-05
  Administered 2017-07-22 – 2017-08-05 (×14): 75 mg via ORAL
  Filled 2017-07-18 (×19): qty 1

## 2017-07-18 MED ORDER — QUETIAPINE FUMARATE 50 MG PO TABS
50.0000 mg | ORAL_TABLET | Freq: Every day | ORAL | Status: DC
  Administered 2017-07-18: 50 mg via ORAL
  Filled 2017-07-18: qty 1

## 2017-07-18 MED ORDER — QUETIAPINE FUMARATE ER 50 MG PO TB24
50.0000 mg | ORAL_TABLET | Freq: Every day | ORAL | Status: DC
Start: 2017-07-18 — End: 2017-07-18

## 2017-07-18 MED ORDER — NAPHAZOLINE-GLYCERIN 0.012-0.2 % OP SOLN
1.0000 [drp] | Freq: Two times a day (BID) | OPHTHALMIC | Status: DC
Start: 2017-07-18 — End: 2017-08-12
  Administered 2017-07-18 (×2): 1 [drp] via OPHTHALMIC
  Administered 2017-07-19 – 2017-07-21 (×6): 2 [drp] via OPHTHALMIC
  Administered 2017-07-22 (×2): 1 [drp] via OPHTHALMIC
  Administered 2017-07-23 – 2017-07-26 (×7): 2 [drp] via OPHTHALMIC
  Administered 2017-07-26 – 2017-07-27 (×2): 1 [drp] via OPHTHALMIC
  Administered 2017-07-28 – 2017-07-31 (×5): 2 [drp] via OPHTHALMIC
  Administered 2017-08-01 – 2017-08-03 (×6): 1 [drp] via OPHTHALMIC
  Administered 2017-08-04 (×2): 2 [drp] via OPHTHALMIC
  Administered 2017-08-05 (×2): 1 [drp] via OPHTHALMIC
  Administered 2017-08-06 – 2017-08-07 (×3): 2 [drp] via OPHTHALMIC
  Administered 2017-08-07 – 2017-08-10 (×7): 1 [drp] via OPHTHALMIC
  Administered 2017-08-11 (×2): 2 [drp] via OPHTHALMIC
  Administered 2017-08-12: 1 [drp] via OPHTHALMIC
  Filled 2017-07-18 (×2): qty 15

## 2017-07-18 MED ORDER — VITAL 1.5 CAL PO LIQD
1000.0000 mL | ORAL | Status: DC
  Administered 2017-07-18 (×2): 1000 mL
  Filled 2017-07-18 (×4): qty 1000

## 2017-07-18 MED ORDER — NOREPINEPHRINE BITARTRATE 1 MG/ML IV SOLN
0.0000 ug/min | INTRAVENOUS | Status: DC
  Administered 2017-07-19: 7 ug/min via INTRAVENOUS
  Administered 2017-07-20: 3 ug/min via INTRAVENOUS
  Administered 2017-07-21: 30 ug/min via INTRAVENOUS
  Administered 2017-07-21: 33 ug/min via INTRAVENOUS
  Administered 2017-07-22: 12 ug/min via INTRAVENOUS
  Administered 2017-07-23: 10 ug/min via INTRAVENOUS
  Administered 2017-07-25 – 2017-07-26 (×2): 8 ug/min via INTRAVENOUS
  Filled 2017-07-18 (×13): qty 16

## 2017-07-18 MED ORDER — FUROSEMIDE 10 MG/ML IJ SOLN
8.0000 mg/h | INTRAMUSCULAR | Status: DC
  Administered 2017-07-18: 8 mg/h via INTRAVENOUS
  Filled 2017-07-18: qty 21

## 2017-07-18 MED ORDER — FENTANYL CITRATE (PF) 100 MCG/2ML IJ SOLN
12.5000 ug | INTRAMUSCULAR | Status: DC | PRN
  Administered 2017-07-18: 12.5 ug via INTRAVENOUS
  Filled 2017-07-18: qty 2

## 2017-07-18 MED ORDER — FUROSEMIDE 10 MG/ML IJ SOLN
20.0000 mg | Freq: Two times a day (BID) | INTRAMUSCULAR | Status: DC
  Administered 2017-07-18: 20 mg via INTRAVENOUS
  Filled 2017-07-18: qty 2

## 2017-07-18 MED ORDER — POTASSIUM CHLORIDE 20 MEQ/15ML (10%) PO SOLN
40.0000 meq | Freq: Three times a day (TID) | ORAL | Status: AC
Start: 2017-07-18 — End: 2017-07-18
  Administered 2017-07-18 (×2): 40 meq via ORAL
  Filled 2017-07-18 (×2): qty 30

## 2017-07-18 MED ORDER — SODIUM CHLORIDE 0.9% FLUSH
10.0000 mL | Freq: Two times a day (BID) | INTRAVENOUS | Status: DC
  Administered 2017-07-19 – 2017-07-27 (×12): 10 mL

## 2017-07-18 MED ORDER — INSULIN DETEMIR 100 UNIT/ML ~~LOC~~ SOLN
10.0000 [IU] | Freq: Two times a day (BID) | SUBCUTANEOUS | Status: DC
  Administered 2017-07-18 – 2017-08-12 (×42): 10 [IU] via SUBCUTANEOUS
  Filled 2017-07-18 (×53): qty 0.1

## 2017-07-18 NOTE — Progress Notes (Signed)
.. ..  Name: [name] MRN: [id number] DOB: 07-14-1940    ADMISSION DATE:  07/10/2017  CONSULTATION DATE:  07/12/17  REFERRING MD :  Nils Pyle MD  CHIEF COMPLAINT:   BRIEF PATIENT DESCRIPTION: SIGNIFICANT EVENTS  77 yr old female POD 1 TEE, Open Heart with evacuation of Hematoma and placement of RVAD.  STUDIES: Echo12/12/2016 LVEF 55-60%, Grade 1 DD, Mild/Mod AI with LVOT, Mid ascending aortic diameter 48.88 mm, Mild LAE, RV normal    BRIEF:   77 year old female with past medical history significant for fusiform ascending aortic aneurysm, hypertension, hyperlipidemia, status post AVR and replacement of ascending aortic aneurysm.  On 07/10/2017 postprocedure patient developed acute cardiogenic shock had an episode of V. fib arrest was taken back to the OR on 07/11/2017 for evacuation of hematoma and placement of RVAD.  PCCM consulted for ventilator management.   Events - 07/10/17 Admit - 07/10/17 S/p AVR and ascending AA replacement  - 07/10/17 VF Arrest in evening with shock x 1 - 07/11/17 to OR for evacuation of hematoma and RVAD placement  - 07/12/17 PCCM consult 07/15/2017 Lasix per cardiology CVP of 5   SUBJECTIVE/OVERNIGHT/INTERVAL HX Weaning   VITAL SIGNS: Temp:  [97.3 F (36.3 C)-100 F (37.8 C)] 99.1 F (37.3 C) (01/22 0800) Pulse Rate:  [81-118] 87 (01/22 0800) Resp:  [0-34] 15 (01/22 0800) BP: (85-163)/(53-123) 94/55 (01/22 0800) SpO2:  [88 %-100 %] 99 % (01/22 0802) Arterial Line BP: (92-180)/(44-89) 96/44 (01/22 0800) FiO2 (%):  [40 %-100 %] 40 % (01/22 0802) Weight:  [214 lb 1.1 oz (97.1 kg)] 214 lb 1.1 oz (97.1 kg) (01/22 0600)  PHYSICAL EXAMINATION:  General: This is 77 year old female status post open heart surgery currently sedated but waking up slowly as we wean sedated with medications HEENT: Orally intubated normocephalic atraumatic no jugular venous distention has right IJ PA catheter in place Pulmonary: Scattered rhonchi equal chest rise Cardiac:  Regular rate and rhythm style tubes unremarkable sternal dressing intact staples intact and chest Abdomen: Soft nontender positive bowel sounds Extremities/muscular skeletal: Warm dry does have dependent edema Neuro: Awake, follows commands, still somewhat sedated   PULMONARY Recent Labs  Lab 07/17/17 0816  07/17/17 0932 07/17/17 1049 07/17/17 1156 07/17/17 1715 07/17/17 1852 07/18/17 0404 07/18/17 0755  PHART 7.436  --  7.438  --  7.361  --  7.381 7.400  --   PCO2ART 41.1  --  40.4  --  43.7  --  41.6 37.2  --   PO2ART 133.0*  --  139.0*  --  109.0*  --  63.0* 64.0*  --   HCO3 27.7  --  27.3  --  25.0  --  24.7 23.0  --   TCO2 29   < > 29 28 26 24 26 24   --   O2SAT 99.0  --  99.0  --  98.0  --  92.0 92.0 55.2   < > = values in this interval not displayed.    CBC Recent Labs  Lab 07/17/17 1144 07/17/17 1709 07/17/17 1715 07/18/17 0359  HGB 10.1* 10.1* 9.5* 9.5*  HCT 29.8* 29.7* 28.0* 28.8*  WBC 16.0* 17.9*  --  24.1*  PLT 88* 130*  --  198    COAGULATION Recent Labs  Lab 07/11/17 0944 07/11/17 1037 07/11/17 1259 07/11/17 1552 07/17/17 1144  INR 1.76 1.73 1.85 1.71 1.20    CARDIAC  No results for input(s): TROPONINI in the last 168 hours. No results for input(s): PROBNP in the  last 168 hours.   CHEMISTRY Recent Labs  Lab 07/14/17 0217  07/15/17 0231  07/16/17 0233  07/17/17 0346  07/17/17 0850 07/17/17 0928 07/17/17 1049 07/17/17 1141 07/17/17 1709 07/17/17 1715 07/18/17 0359  NA 135   < > 134*   < > 134*   < > 138   < > 134* 135 136 137  --  134* 134*  K 3.8   < > 3.7   < > 4.2   < > 4.2   < > 3.5 3.7 3.4* 3.6  --  4.3 4.1  CL 96*   < > 95*   < > 98*   < > 100*   < > 94* 96* 96*  --   --  98* 101  CO2 28  --  29  --  26  --  28  --   --   --   --   --   --   --  23  GLUCOSE 178*   < > 108*   < > 175*   < > 126*   < > 136* 142* 113* 113*  --  135* 101*  BUN 14   < > 12   < > 10   < > 8   < > 8 8 8   --   --  11 13  CREATININE 0.75   < > 0.62    < > 0.58   < > 0.57   < > 0.40* 0.50 0.50  --  0.55 0.50 0.59  CALCIUM 7.8*  --  8.9  --  8.7*  --  8.0*  --   --   --   --   --   --   --  7.4*  MG 1.5*  --  1.5*  --  1.5*  --  1.3*  --   --   --   --   --  2.6*  --  2.0  PHOS 4.1  --  4.5  --  5.6*  --  3.8  --   --   --   --   --   --   --   --    < > = values in this interval not displayed.   Estimated Creatinine Clearance: 70 mL/min (by C-G formula based on SCr of 0.59 mg/dL).   LIVER Recent Labs  Lab 07/11/17 0944 07/11/17 1037 07/11/17 1259 07/11/17 1552 07/12/17 0323 07/13/17 0405 07/14/17 0217 07/15/17 0231 07/17/17 1144  AST 151* 150*  --   --  110* 77* 49* 34  --   ALT 69* 70*  --   --  50 41 32 25  --   ALKPHOS 46 46  --   --  40 50 61 73  --   BILITOT 2.0* 2.0*  --   --  1.3* 1.7* 1.0 1.1  --   PROT 4.2* 4.2*  --   --  3.9* 4.5* 4.7* 4.4*  --   ALBUMIN 3.1* 3.1*  --   --  2.9* 2.9* 2.6* 2.3*  --   INR 1.76 1.73 1.85 1.71  --   --   --   --  1.20    INFECTIOUS Recent Labs  Lab 07/13/17 2331 07/15/17 0231 07/16/17 0233  LATICACIDVEN 1.6  --   --   PROCALCITON  --  0.67 0.50    ENDOCRINE CBG (last 3)  Recent Labs    07/18/17 0506 07/18/17 0613 07/18/17 0720  GLUCAP 108*  106* 119*     IMAGING x48h  - image(s) personally visualized  -   highlighted in bold Dg Chest 1 View  Result Date: 07/17/2017 CLINICAL DATA:  Status post removal of right ventricular assist device. Evaluate for pneumothorax. EXAM: CHEST 1 VIEW COMPARISON:  Portable chest x-ray of earlier today FINDINGS: The lungs are hypoinflated. There is no pneumothorax or significant pleural effusion. The interstitial markings remain increased. The cardiac silhouette is enlarged. The pulmonary vascularity is slightly less prominent than on the earlier study today. The right chest tube appears to have been advanced such that its tip overlies the posterolateral interspace between the fifth and sixth ribs. A new left chest tube is present whose tip  projects over the posterior aspect of the left 6th rib. The endotracheal tube tip lies approximately 2 cm above the carina. The feeding tube tip projects below the inferior margin of the image. The right ventricular assist device has been removed. The Swan-Ganz catheter tip is stable in the main pulmonary outflow tract. The prosthetic aortic valve is stable in position. The left subclavian venous catheter tip projects over the proximal SVC. IMPRESSION: Status post removal of the right ventricular assist device. Repositioning of the right chest tube. New left chest tube. No pneumothorax. CHF with mild interstitial edema. The remaining support tubes are in reasonable position. Electronically Signed   By: David  Martinique M.D.   On: 07/17/2017 11:21   Dg Chest Port 1 View  Result Date: 07/18/2017 CLINICAL DATA:  Status post removal of right ventricular assist device yesterday and chest closed. EXAM: PORTABLE CHEST 1 VIEW COMPARISON:  Portable chest x-ray of July 17, 2017 at 10:56 a.m. FINDINGS: The right lung remains mildly hypoinflated. The left lung is better inflated. There is no pneumothorax. There is patchy density in the right upper lobe which is stable. There is left lower lobe density which has improved since yesterday's study. The interstitial markings of both lungs are increased. The pulmonary vascularity is no longer engorged. The cardiac silhouette remains enlarged. The Swan-Ganz catheter tip projects in the proximal right main pulmonary outflow tract. The bilateral chest tubes and the mediastinal drain are in stable position. The endotracheal tube tip projects 3 cm above the carina. The feeding tube tip projects below the inferior margin of the image. The left subclavian venous catheter tip projects over the junction of the proximal and midportions of the SVC. IMPRESSION: Improved appearance of both lungs with decreased pulmonary edema. Persistent subsegmental atelectasis in the right upper lobe and at  the left base. The support tubes are in reasonable position. Electronically Signed   By: David  Martinique M.D.   On: 07/18/2017 08:02   Dg Chest Port 1 View  Result Date: 07/17/2017 CLINICAL DATA:  Status post CABG on July 10, 2017 EXAM: PORTABLE CHEST 1 VIEW COMPARISON:  Portable chest x-ray of July 16, 2017 FINDINGS: The lungs are mildly hypoinflated greatest on the right. The interstitial markings are coarse throughout much of the left lung and at the right lung base. The cardiac silhouette is enlarged. There is no pneumothorax nor large pleural effusion. The pulmonary vascularity is prominent centrally. There is a prosthetic aortic valve. The Swan-Ganz catheter tip projects in the pulmonary outflow tract. Again demonstrated are the large caliber catheter is present. One on the right appears to traverse the IVC and terminate in the upper SVC. The 2nd large bore tube be an arterial catheter terminating in the mid thoracic aorta. The endotracheal tube tip lies 2.6  cm above the carina. The feeding tube tip projects below the inferior margin of the image. The large caliber right sided chest tube tip projects over the posterior aspect of the right fourth rib. IMPRESSION: Improved aeration of the left lung. Persistent mild interstitial prominence throughout the mid and lower left lung and at the right lung base. The support tubes are in reasonable position radiographically. Electronically Signed   By: David  Martinique M.D.   On: 07/17/2017 07:35     ASSESSMENT / PLAN: 77 year old female with PMHx significant for fusiform ascending aortic aneurysm, hypertension, hyperlipidemia, status post AVR and replacement of ascending aortic aneurysm.  On 07/10/2017 postprocedure patient developed acute cardiogenic shock had an episode of V. fib arrest was taken back to the OR on 07/11/2017 for evacuation of hematoma and placement of RVAD.  RVAD now removed, weaning pressors.  Working on weaning, hope to extubate either today  or   ASSESSMENT / PLAN:   Cardiogenic shock s/p removal of RVAD 1/21; sternum now closed Atrial fibrillation per tele Remains on multiple pressors Plan Continue to wean vasoactive drips per thoracic surgery and heart failure team Chest tube management per thoracic surgery Day #2 zinacef   Acute respiratory failure in setting of cardiogenic shock  Chest x-ray reviewed: Tubes and lines are in satisfactory position there is decreased edema when comparing film from yesterday to today Now on spontaneous breathing trial, tidal volumes in the 4-500 range  plan Change RASS goal to 0 Continue Precedex, discontinuing fentanyl I like to see her at least complete 45 minutes to an hour spontaneous breathing trial to ensure she can handle this hemodynamically Also need more time for fentanyl to washout  hopefully extubate either later today or tomorrow Continue diuresis as directed by cardiothoracic surgery  Acute encephalopathy Plan Continue Precedex Goal RASS 0  anemia of critical illness Thrombocytopenia  Plan Trend CBC Transfuse per protocol  Hyperglycemia  Plan Sliding scale insulin   FAMILY  - Updates 07/16/2017 no family at bedside  - Inter-disciplinary family meet or Palliative Care meeting due by:  DAy 7. Current LOS is LOS 8 days  DISPO  DVT prophylaxis:  SUP: PPI  Diet: scd Activity: BR Disposition : ICU   Erick Colace ACNP-BC [place] Pager # [phone] OR # [phone] if no answer  Attending Note:  77 year old female with extensive cardiac history who presents post cardiac surgery with extreme fluid overload, pulmonary edema and respiratory failure.  Cardiogenic shock is slowly improving but on exam there is evidence of fluid overload diffusely and diffuse crackles.  I reviewed CXR myself, pulmonary edema noted.  Will begin lasix drip at 8 mg/hr.  Potassium 40 meq PO q8 x2 doses.  AM labs ordered.  Continue PSV today and hope if negative  enough is to extubate today.  Otherwise will need to discuss trach.  The patient is critically ill with multiple organ systems failure and requires high complexity decision making for assessment and support, frequent evaluation and titration of therapies, application of advanced monitoring technologies and extensive interpretation of multiple databases.   Critical Care Time devoted to patient care services described in this note is  35  Minutes. This time reflects time of care of this signee Dr Jennet Maduro. This critical care time does not reflect procedure time, or teaching time or supervisory time of PA/NP/Med student/Med Resident etc but could involve care discussion time.  Rush Farmer, M.D. [facility]. Pager: [phone]. After hours  pager: [phone].

## 2017-07-18 NOTE — Progress Notes (Signed)
Peripherally Inserted Central Catheter/Midline Placement  The IV Nurse has discussed with the patient and/or persons authorized to consent for the patient, the purpose of this procedure and the potential benefits and risks involved with this procedure.  The benefits include less needle sticks, lab draws from the catheter, and the patient may be discharged home with the catheter. Risks include, but not limited to, infection, bleeding, blood clot (thrombus formation), and puncture of an artery; nerve damage and irregular heartbeat and possibility to perform a PICC exchange if needed/ordered by physician.  Alternatives to this procedure were also discussed.  Bard Power PICC patient education guide, fact sheet on infection prevention and patient information card has been provided to patient /or left at bedside.  Consent signed by husband.  PICC/Midline Placement Documentation  PICC Double Lumen 07/18/17 PICC Right Cephalic 41 cm 0 cm (Active)  Indication for Insertion or Continuance of Line Vasoactive infusions;Prolonged intravenous therapies 07/18/2017  3:52 PM  Exposed Catheter (cm) 0 cm 07/18/2017  3:52 PM  Site Assessment Clean;Dry;Intact 07/18/2017  3:52 PM  Lumen #1 Status Flushed;Saline locked;Blood return noted 07/18/2017  3:52 PM  Lumen #2 Status Flushed;Saline locked;Blood return noted 07/18/2017  3:52 PM  Dressing Type Transparent 07/18/2017  3:52 PM  Dressing Status Clean;Dry;Intact;Antimicrobial disc in place 07/18/2017  3:52 PM  Dressing Change Due 07/25/17 07/18/2017  3:52 PM       Gordan Payment 07/18/2017, 3:55 PM

## 2017-07-18 NOTE — Progress Notes (Signed)
Potassium 3.7, patient was ordered 40 mEq potassium every 8 hours x 2 doses.  One dose already given and second dose due at 2000.  Per MD Roxan Hockey no additional potassium orders at this time.

## 2017-07-18 NOTE — Progress Notes (Signed)
Advanced Heart Failure Rounding Note  Primary Cardiologist: Dr. Percival Spanish (2014) HF: (New) Dr. Haroldine Laws   Subjective:    Events - 07/10/17 S/p AVR and ascending AA replacement  - 07/10/17 VF Arrest in evening with shock x 1 - 07/11/17 RVAD placement  - 07/17/2017 RVAD removed with chest closed  Currently on NE 5 mcg, milrinone 0.25 mcg, Epi 5 mcg, and amio 30 mg.   Sedated/Intubated on 40%.   Swan #s  PAP 23/12 CO 4.5  CI 2.4    Echo 06/02/2017 LVEF 55-60%, Grade 1 DD, Mild/Mod AI with LVOT, Mid ascending aortic diameter 48.88 mm, Mild LAE, RV normal  Objective:   Weight Range: 214 lb 1.1 oz (97.1 kg) Body mass index is 35.62 kg/m.   Vital Signs:   Temp:  [97.3 F (36.3 C)-100 F (37.8 C)] 99.7 F (37.6 C) (01/22 0730) Pulse Rate:  [81-118] 96 (01/22 0730) Resp:  [0-34] 34 (01/22 0730) BP: (85-163)/(53-123) 94/55 (01/22 0730) SpO2:  [88 %-100 %] 99 % (01/22 0802) Arterial Line BP: (92-180)/(46-89) 92/48 (01/22 0715) FiO2 (%):  [40 %-100 %] 40 % (01/22 0802) Weight:  [214 lb 1.1 oz (97.1 kg)] 214 lb 1.1 oz (97.1 kg) (01/22 0600) Last BM Date: (PTA)  Weight change: Filed Weights   07/16/17 0400 07/17/17 0500 07/18/17 0600  Weight: 198 lb 6.6 oz (90 kg) 208 lb 8.9 oz (94.6 kg) 214 lb 1.1 oz (97.1 kg)    Intake/Output:   Intake/Output Summary (Last 24 hours) at 07/18/2017 0823 Last data filed at 07/18/2017 0700 Gross per 24 hour  Intake 6008.3 ml  Output 2810 ml  Net 3198.3 ml    Physical Exam  Swan RIJ L Subclavian CVP 12 General:  Intubated/Sedated HEENT:ETT Neck: supple. RIJ swan Carotids 2+ bilat; no bruits. No lymphadenopathy or thryomegaly appreciated. Cor: PMI nondisplaced. Regular rate & rhythm. No rubs, gallops or murmurs. Sternal dressing in plce with some weeping  Lungs: coarse  Abdomen: soft, nontender, nondistended. No hepatosplenomegaly. No bruits or masses. Good bowel sounds. Extremities: no cyanosis, clubbing, rash, R and LLE 2-3+  edema Neuro: intubated/sedated.  GU: foley    Telemetry  NSR 90-100s Personally reviewed    EKG    No new tracings.    Labs    CBC Recent Labs    07/16/17 0233  07/17/17 0346  07/17/17 1709 07/17/17 1715 07/18/17 0359  WBC 11.0*  --  12.5*   < > 17.9*  --  24.1*  NEUTROABS 7.5  --  9.0*  --   --   --   --   HGB 8.0*   < > 7.8*   < > 10.1* 9.5* 9.5*  HCT 24.7*   < > 23.0*   < > 29.7* 28.0* 28.8*  MCV 87.0  --  88.5   < > 87.6  --  87.8  PLT 125*  --  153   < > 130*  --  198   < > = values in this interval not displayed.   Basic Metabolic Panel Recent Labs    07/16/17 0233  07/17/17 0346  07/17/17 1709 07/17/17 1715 07/18/17 0359  NA 134*   < > 138   < >  --  134* 134*  K 4.2   < > 4.2   < >  --  4.3 4.1  CL 98*   < > 100*   < >  --  98* 101  CO2 26  --  28  --   --   --  23  GLUCOSE 175*   < > 126*   < >  --  135* 101*  BUN 10   < > 8   < >  --  11 13  CREATININE 0.58   < > 0.57   < > 0.55 0.50 0.59  CALCIUM 8.7*  --  8.0*  --   --   --  7.4*  MG 1.5*  --  1.3*  --  2.6*  --  2.0  PHOS 5.6*  --  3.8  --   --   --   --    < > = values in this interval not displayed.   Liver Function Tests No results for input(s): AST, ALT, ALKPHOS, BILITOT, PROT, ALBUMIN in the last 72 hours. No results for input(s): LIPASE, AMYLASE in the last 72 hours. Cardiac Enzymes No results for input(s): CKTOTAL, CKMB, CKMBINDEX, TROPONINI in the last 72 hours.  BNP: BNP (last 3 results) No results for input(s): BNP in the last 8760 hours.  ProBNP (last 3 results) No results for input(s): PROBNP in the last 8760 hours.   D-Dimer No results for input(s): DDIMER in the last 72 hours. Hemoglobin A1C No results for input(s): HGBA1C in the last 72 hours. Fasting Lipid Panel No results for input(s): CHOL, HDL, LDLCALC, TRIG, CHOLHDL, LDLDIRECT in the last 72 hours. Thyroid Function Tests No results for input(s): TSH, T4TOTAL, T3FREE, THYROIDAB in the last 72 hours.  Invalid  input(s): FREET3  Other results:   Imaging    Dg Chest 1 View  Result Date: 07/17/2017 CLINICAL DATA:  Status post removal of right ventricular assist device. Evaluate for pneumothorax. EXAM: CHEST 1 VIEW COMPARISON:  Portable chest x-ray of earlier today FINDINGS: The lungs are hypoinflated. There is no pneumothorax or significant pleural effusion. The interstitial markings remain increased. The cardiac silhouette is enlarged. The pulmonary vascularity is slightly less prominent than on the earlier study today. The right chest tube appears to have been advanced such that its tip overlies the posterolateral interspace between the fifth and sixth ribs. A new left chest tube is present whose tip projects over the posterior aspect of the left 6th rib. The endotracheal tube tip lies approximately 2 cm above the carina. The feeding tube tip projects below the inferior margin of the image. The right ventricular assist device has been removed. The Swan-Ganz catheter tip is stable in the main pulmonary outflow tract. The prosthetic aortic valve is stable in position. The left subclavian venous catheter tip projects over the proximal SVC. IMPRESSION: Status post removal of the right ventricular assist device. Repositioning of the right chest tube. New left chest tube. No pneumothorax. CHF with mild interstitial edema. The remaining support tubes are in reasonable position. Electronically Signed   By: David  Martinique M.D.   On: 07/17/2017 11:21   Dg Chest Port 1 View  Result Date: 07/18/2017 CLINICAL DATA:  Status post removal of right ventricular assist device yesterday and chest closed. EXAM: PORTABLE CHEST 1 VIEW COMPARISON:  Portable chest x-ray of July 17, 2017 at 10:56 a.m. FINDINGS: The right lung remains mildly hypoinflated. The left lung is better inflated. There is no pneumothorax. There is patchy density in the right upper lobe which is stable. There is left lower lobe density which has improved  since yesterday's study. The interstitial markings of both lungs are increased. The pulmonary vascularity is no longer engorged. The cardiac silhouette remains enlarged. The Swan-Ganz catheter tip projects in the proximal right main  pulmonary outflow tract. The bilateral chest tubes and the mediastinal drain are in stable position. The endotracheal tube tip projects 3 cm above the carina. The feeding tube tip projects below the inferior margin of the image. The left subclavian venous catheter tip projects over the junction of the proximal and midportions of the SVC. IMPRESSION: Improved appearance of both lungs with decreased pulmonary edema. Persistent subsegmental atelectasis in the right upper lobe and at the left base. The support tubes are in reasonable position. Electronically Signed   By: David  Martinique M.D.   On: 07/18/2017 08:02    Medications:    Scheduled Medications: . acetaminophen  1,000 mg Oral Q6H   Or  . acetaminophen (TYLENOL) oral liquid 160 mg/5 mL  1,000 mg Per Tube Q6H  . acetaminophen (TYLENOL) oral liquid 160 mg/5 mL  650 mg Per Tube Once   Or  . acetaminophen  650 mg Rectal Once  . aspirin EC  325 mg Oral Daily   Or  . aspirin  324 mg Per Tube Daily  . bisacodyl  10 mg Oral Daily   Or  . bisacodyl  10 mg Rectal Daily  . chlorhexidine gluconate (MEDLINE KIT)  15 mL Mouth Rinse BID  . Chlorhexidine Gluconate Cloth  6 each Topical Daily  . docusate sodium  200 mg Oral Daily  . furosemide  20 mg Intravenous BID  . insulin aspart  0-24 Units Subcutaneous Q4H  . insulin detemir  10 Units Subcutaneous BID  . insulin regular  0-10 Units Intravenous TID WC  . latanoprost  1 drop Both Eyes QHS  . mouth rinse  15 mL Mouth Rinse 10 times per day  . metoprolol tartrate  12.5 mg Oral BID   Or  . metoprolol tartrate  12.5 mg Per Tube BID  . naphazoline-glycerin  1-2 drop Both Eyes BID  . pantoprazole (PROTONIX) IV  40 mg Intravenous Q12H  . QUEtiapine  50 mg Oral QHS  .  sodium chloride flush  10-40 mL Intracatheter Q12H  . sodium chloride flush  3 mL Intravenous Q12H  . venlafaxine XR  75 mg Oral Daily    Infusions: . sodium chloride 10 mL/hr at 07/18/17 0700  . sodium chloride    . sodium chloride 1 mL/hr at 07/18/17 0700  . albumin human    . amiodarone 30 mg/hr (07/18/17 0700)  . cefUROXime (ZINACEF)  IV 1.5 g (07/18/17 0743)  . dexmedetomidine (PRECEDEX) IV infusion 1.2 mcg/kg/hr (07/18/17 0700)  . EPINEPHrine 4 mg in dextrose 5% 250 mL infusion (16 mcg/mL) 5 mcg/min (07/18/17 0700)  . feeding supplement (VITAL 1.5 CAL)    . fentaNYL infusion INTRAVENOUS 300 mcg/hr (07/18/17 0749)  . lactated ringers    . lactated ringers Stopped (07/17/17 1200)  . lactated ringers 10 mL/hr at 07/18/17 0700  . milrinone 0.25 mcg/kg/min (07/18/17 0700)  . nitroGLYCERIN    . norepinephrine (LEVOPHED) Adult infusion 5 mcg/min (07/18/17 0805)  . phenylephrine (NEO-SYNEPHRINE) Adult infusion    . vancomycin Stopped (07/17/17 2130)    PRN Medications: sodium chloride, albumin human, lactated ringers, metoprolol tartrate, midazolam, ondansetron (ZOFRAN) IV, oxyCODONE, sodium chloride flush, sodium chloride flush    Patient Profile   [name] is a 76 y.o. female with history of fusiform ascending aortic aneurysm, HTN, and HLD.   Admitted 07/10/17 for planned AVR and replacement of ascending aortic aneurysm. Developed acute cardiogenic shock + VF arrest. Taken back to OR am of 07/11/17 for evacuation of hematoma  and placement of RVAD.   Assessment/Plan   1. Cardiogenic shock s/p cardiac surgery with R>L CHF. - Pre-op Echo 06/02/2017 LVEF 55-60%, Grade 1 DD, Mild/Mod AI with LVOT, Mid ascending aortic diameter 48.88 mm, Mild LAE, RV normal - S/P RVAD removal 07/17/2017  - On epi 5 mcg, NE 5 mcg, milrinone 0.25 mcg, amio 30 mg per our  - CVP 11-12. Continue lasix 20 mg twice a day.   . Per CT surgery .  -2. VF arrest - Stable. No further arrythmia.  - Goal  K > 4.0 and Mg > 2.0. Supp aggressively -Mag 2.0  3. Atrial fibrillation - Remains in NSR on IV amiodarone. - Follow electrolytes 4. Acute respiratory failure - Intubated and sedated.   Managed by CCM CXR decreased edema.   5. S/p AVR and Ascending aortic aneurysm replacement. - Per surgery. No change. 6. AKI Creatinine 0.59  7. Anemia  Hgb 9.5    Length of Stay: [place], NP  8:23 AM Advanced Heart Failure Team Pager [phone] (M-F; 7a - 4p)  Please contact [facility] for night-coverage after hours (4p -7a ) and weekends on [email or web address]  Agree with above.   Now awake on vent. Follows commands. FiO2 40%. NE off. Still on epi 5 and milrinone 0.25. PA numbers look good. Massively volume overloaded  Exam Awake on vent Follows commands +ETT RIJ swan Cor RRR sternal dressing in place. Weeping from wounds Ab distended NT Ext 3+ edema  She is making progress. Weaning pressors. PA numbers look good. Massively volume overloaded. Will give lasix tid today. Hopefully can extubate tomorrow.   CRITICAL CARE Performed by: Glori Bickers  Total critical care time: 35 minutes  Critical care time was exclusive of separately billable procedures and treating other patients.  Critical care was necessary to treat or prevent imminent or life-threatening deterioration.  Critical care was time spent personally by me (independent of midlevel providers or residents) on the following activities: development of treatment plan with patient and/or surrogate as well as nursing, discussions with consultants, evaluation of patient's response to treatment, examination of patient, obtaining history from patient or surrogate, ordering and performing treatments and interventions, ordering and review of laboratory studies, ordering and review of radiographic studies, pulse oximetry and re-evaluation of patient's condition.   Glori Bickers, MD  10:58 AM

## 2017-07-18 NOTE — Progress Notes (Signed)
      [address]       [place] [address]             [phone]      Intubated  BP 117/75   Pulse 89   Temp 100.1 F (37.8 C) (Oral)   Resp (!) 9   Ht 5\' 5"  (1.651 m)   Wt 214 lb 1.1 oz (97.1 kg)   SpO2 98%   BMI 35.62 kg/m   Intake/Output Summary (Last 24 hours) at 07/18/2017 1754 Last data filed at 07/18/2017 1730 Gross per 24 hour  Intake 4161.12 ml  Output 2955 ml  Net 1206.12 ml   Remains on milrinone, epi and norepi CBG well controlled No PM labs  Newbern C. Roxan Hockey, MD [facility] [phone]

## 2017-07-18 NOTE — Progress Notes (Signed)
1 Day Post-Op Procedure(s) (LRB): REMOVAL OF RVAD WITH PUMP STANDBY (N/A) TRANSESOPHAGEAL ECHOCARDIOGRAM (TEE) (N/A) Subjective: Hemodynamics stable after RVAD decannulation She still has some RV dysfunction, 2 + TR Still with severe fluid overload- now on lasix drip to remove fluid prior to extubation. NSR Tube feeds resuimed CXR improved after drainingm pleural effusions in OR Objective: Vital signs in last 24 hours: Temp:  [98.1 F (36.7 C)-100 F (37.8 C)] 99.3 F (37.4 C) (01/22 1107) Pulse Rate:  [81-118] 90 (01/22 1107) Cardiac Rhythm: Normal sinus rhythm;Sinus tachycardia (01/22 0800) Resp:  [0-34] 25 (01/22 1107) BP: (85-163)/(44-123) 99/44 (01/22 1107) SpO2:  [88 %-100 %] 97 % (01/22 1107) Arterial Line BP: (92-180)/(44-89) 103/48 (01/22 1100) FiO2 (%):  [40 %-50 %] 40 % (01/22 1107) Weight:  [214 lb 1.1 oz (97.1 kg)] 214 lb 1.1 oz (97.1 kg) (01/22 0600)  Hemodynamic parameters for last 24 hours: PAP: (21-56)/(0-34) 29/12 CVP:  [6 mmHg-39 mmHg] 13 mmHg CO:  [3.8 L/min-5.6 L/min] 4.5 L/min CI:  [2 L/min/m2-3 L/min/m2] 2.4 L/min/m2  Intake/Output from previous day: 01/21 0701 - 01/22 0700 In: 6008.3 [I.V.:4178.3; Blood:780; NG/GT:150; IV Piggyback:900] Out: 2810 [Urine:2390; Chest Tube:420] Intake/Output this shift: Total I/O In: 739.7 [I.V.:359; NG/GT:130.7; IV Piggyback:250] Out: 475 [Urine:415; Chest Tube:60]       Exam    General-sedated on vent, comfortable    Neck- no JVD, no cervical adenopathy palpable, no carotid bruit   Lungs- clear without rales, wheezes   Cor- regular rate and rhythm, no murmur , gallop   Abdomen- soft, non-tender   Extremities - warm, non-tender, minimal edema   Neuro- responsive, no focal weakness   Lab Results: Recent Labs    07/17/17 1709 07/17/17 1715 07/18/17 0359  WBC 17.9*  --  24.1*  HGB 10.1* 9.5* 9.5*  HCT 29.7* 28.0* 28.8*  PLT 130*  --  198   BMET:  Recent Labs    07/17/17 0346  07/17/17 1715  07/18/17 0359  NA 138   < > 134* 134*  K 4.2   < > 4.3 4.1  CL 100*   < > 98* 101  CO2 28  --   --  23  GLUCOSE 126*   < > 135* 101*  BUN 8   < > 11 13  CREATININE 0.57   < > 0.50 0.59  CALCIUM 8.0*  --   --  7.4*   < > = values in this interval not displayed.    PT/INR:  Recent Labs    07/17/17 1144  LABPROT 15.1  INR 1.20   ABG    Component Value Date/Time   PHART 7.400 07/18/2017 0404   HCO3 23.0 07/18/2017 0404   TCO2 24 07/18/2017 0404   ACIDBASEDEF 1.0 07/18/2017 0404   O2SAT 55.2 07/18/2017 0755   CBG (last 3)  Recent Labs    07/18/17 0808 07/18/17 0909 07/18/17 1015  GLUCAP 111* 107* 134*    Assessment/Plan: S/P Procedure(s) (LRB): REMOVAL OF RVAD WITH PUMP STANDBY (N/A) TRANSESOPHAGEAL ECHOCARDIOGRAM (TEE) (N/A) RV dysfunction Fluid overload - RV systolic heart failure Swan 43 week old - will remove after PICC placed PS weaning trials  LOS: 8 days    [name] 07/18/2017

## 2017-07-18 NOTE — Progress Notes (Signed)
240 cc Fentanyl wasted in sink, witnessed by Meda Klinefelter RN.

## 2017-07-18 NOTE — Progress Notes (Signed)
Was on PSV 5/peep5 Vts acceptable but increased accessory use  Plan Increase PSV to 10 Cont PSV today w/ mandatory rest tonight Change fent to PRN Cont precedex Hope extubate in am after more diuresis   Erick Colace ACNP-BC [place] Pager # [phone] OR # [phone] if no answer

## 2017-07-19 ENCOUNTER — Inpatient Hospital Stay (HOSPITAL_COMMUNITY): Payer: Medicare Other

## 2017-07-19 DIAGNOSIS — R601 Generalized edema: Secondary | ICD-10-CM

## 2017-07-19 LAB — POCT I-STAT 3, ART BLOOD GAS (G3+)
Acid-Base Excess: 3 mmol/L — ABNORMAL HIGH (ref 0.0–2.0)
Acid-Base Excess: 6 mmol/L — ABNORMAL HIGH (ref 0.0–2.0)
Acid-Base Excess: 5 mmol/L — ABNORMAL HIGH (ref 0.0–2.0)
Bicarbonate: 26.5 mmol/L (ref 20.0–28.0)
Bicarbonate: 29.9 mmol/L — ABNORMAL HIGH (ref 20.0–28.0)
Bicarbonate: 29.9 mmol/L — ABNORMAL HIGH (ref 20.0–28.0)
O2 Saturation: 96 %
O2 Saturation: 94 %
O2 Saturation: 96 %
Patient temperature: 98.7
Patient temperature: 98.7
Patient temperature: 98.4
TCO2: 28 mmol/L (ref 22–32)
TCO2: 31 mmol/L (ref 22–32)
TCO2: 31 mmol/L (ref 22–32)
pCO2 arterial: 35.1 mmHg (ref 32.0–48.0)
pCO2 arterial: 40.6 mmHg (ref 32.0–48.0)
pCO2 arterial: 42.4 mmHg (ref 32.0–48.0)
pH, Arterial: 7.486 — ABNORMAL HIGH (ref 7.350–7.450)
pH, Arterial: 7.476 — ABNORMAL HIGH (ref 7.350–7.450)
pH, Arterial: 7.456 — ABNORMAL HIGH (ref 7.350–7.450)
pO2, Arterial: 74 mmHg — ABNORMAL LOW (ref 83.0–108.0)
pO2, Arterial: 67 mmHg — ABNORMAL LOW (ref 83.0–108.0)
pO2, Arterial: 79 mmHg — ABNORMAL LOW (ref 83.0–108.0)

## 2017-07-19 LAB — BASIC METABOLIC PANEL
Anion gap: 13 (ref 5–15)
BUN: 15 mg/dL (ref 6–20)
CO2: 23 mmol/L (ref 22–32)
Calcium: 6.8 mg/dL — ABNORMAL LOW (ref 8.9–10.3)
Chloride: 101 mmol/L (ref 101–111)
Creatinine, Ser: 0.78 mg/dL (ref 0.44–1.00)
GFR calc Af Amer: >60 mL/min (ref >60)
GFR calc non Af Amer: >60 mL/min (ref >60)
Glucose, Bld: 135 mg/dL — ABNORMAL HIGH (ref 65–99)
Potassium: 3.3 mmol/L — ABNORMAL LOW (ref 3.5–5.1)
Sodium: 137 mmol/L (ref 135–145)

## 2017-07-19 LAB — GLUCOSE, CAPILLARY
Glucose-Capillary: 140 mg/dL — ABNORMAL HIGH (ref 65–99)
Glucose-Capillary: 151 mg/dL — ABNORMAL HIGH (ref 65–99)
Glucose-Capillary: 76 mg/dL (ref 65–99)
Glucose-Capillary: 88 mg/dL (ref 65–99)
Glucose-Capillary: 94 mg/dL (ref 65–99)
Glucose-Capillary: 98 mg/dL (ref 65–99)

## 2017-07-19 LAB — POCT I-STAT, CHEM 8
BUN: 14 mg/dL (ref 6–20)
Calcium, Ion: 0.86 mmol/L — CL (ref 1.15–1.40)
Chloride: 96 mmol/L — ABNORMAL LOW (ref 101–111)
Creatinine, Ser: 0.6 mg/dL (ref 0.44–1.00)
Glucose, Bld: 142 mg/dL — ABNORMAL HIGH (ref 65–99)
HCT: 30 % — ABNORMAL LOW (ref 36.0–46.0)
Hemoglobin: 10.2 g/dL — ABNORMAL LOW (ref 12.0–15.0)
Potassium: 3.3 mmol/L — ABNORMAL LOW (ref 3.5–5.1)
Sodium: 138 mmol/L (ref 135–145)
TCO2: 29 mmol/L (ref 22–32)

## 2017-07-19 LAB — COOXEMETRY PANEL
Carboxyhemoglobin: 1.3 % (ref 0.5–1.5)
Methemoglobin: 1.2 % (ref 0.0–1.5)
O2 Saturation: 97.9 %
Total hemoglobin: 8.8 g/dL — ABNORMAL LOW (ref 12.0–16.0)

## 2017-07-19 LAB — CBC
HCT: 26.1 % — ABNORMAL LOW (ref 36.0–46.0)
HCT: 30.9 % — ABNORMAL LOW (ref 36.0–46.0)
Hemoglobin: 8.8 g/dL — ABNORMAL LOW (ref 12.0–15.0)
Hemoglobin: 10.2 g/dL — ABNORMAL LOW (ref 12.0–15.0)
MCH: 30.1 pg (ref 26.0–34.0)
MCH: 30 pg (ref 26.0–34.0)
MCHC: 33.7 g/dL (ref 30.0–36.0)
MCHC: 33 g/dL (ref 30.0–36.0)
MCV: 89.4 fL (ref 78.0–100.0)
MCV: 90.9 fL (ref 78.0–100.0)
PLATELETS: 286 10*3/uL (ref 150–400)
Platelets: 222 10*3/uL (ref 150–400)
RBC: 2.92 MIL/uL — ABNORMAL LOW (ref 3.87–5.11)
RBC: 3.4 MIL/uL — ABNORMAL LOW (ref 3.87–5.11)
RDW: 16.3 % — ABNORMAL HIGH (ref 11.5–15.5)
RDW: 16.7 % — AB (ref 11.5–15.5)
WBC: 15.4 10*3/uL — ABNORMAL HIGH (ref 4.0–10.5)
WBC: 19.9 10*3/uL — ABNORMAL HIGH (ref 4.0–10.5)

## 2017-07-19 LAB — COMPREHENSIVE METABOLIC PANEL
ALBUMIN: 2.7 g/dL — AB (ref 3.5–5.0)
ALT: 35 U/L (ref 14–54)
AST: 51 U/L — ABNORMAL HIGH (ref 15–41)
Alkaline Phosphatase: 172 U/L — ABNORMAL HIGH (ref 38–126)
Anion gap: 15 (ref 5–15)
BUN: 15 mg/dL (ref 6–20)
CHLORIDE: 98 mmol/L — AB (ref 101–111)
CO2: 26 mmol/L (ref 22–32)
Calcium: 6.7 mg/dL — ABNORMAL LOW (ref 8.9–10.3)
Creatinine, Ser: 0.72 mg/dL (ref 0.44–1.00)
GFR calc Af Amer: >60 mL/min (ref >60)
GFR calc non Af Amer: >60 mL/min (ref >60)
GLUCOSE: 141 mg/dL — AB (ref 65–99)
POTASSIUM: 3.3 mmol/L — AB (ref 3.5–5.1)
Sodium: 139 mmol/L (ref 135–145)
Total Bilirubin: 1.3 mg/dL — ABNORMAL HIGH (ref 0.3–1.2)
Total Protein: 5.2 g/dL — ABNORMAL LOW (ref 6.5–8.1)

## 2017-07-19 LAB — MAGNESIUM: MAGNESIUM: 1.4 mg/dL — AB (ref 1.7–2.4)

## 2017-07-19 MED ORDER — ORAL CARE MOUTH RINSE
15.0000 mL | Freq: Two times a day (BID) | OROMUCOSAL | Status: DC
  Administered 2017-07-20: 15 mL via OROMUCOSAL

## 2017-07-19 MED ORDER — POTASSIUM CHLORIDE 10 MEQ/50ML IV SOLN
10.0000 meq | INTRAVENOUS | Status: AC
  Administered 2017-07-19 (×6): 10 meq via INTRAVENOUS
  Filled 2017-07-19 (×6): qty 50

## 2017-07-19 MED ORDER — LEVALBUTEROL HCL 1.25 MG/0.5ML IN NEBU
1.2500 mg | INHALATION_SOLUTION | Freq: Four times a day (QID) | RESPIRATORY_TRACT | Status: DC
  Administered 2017-07-19: 1.25 mg via RESPIRATORY_TRACT
  Filled 2017-07-19: qty 0.5

## 2017-07-19 MED ORDER — METOLAZONE 2.5 MG PO TABS
2.5000 mg | ORAL_TABLET | Freq: Once | ORAL | Status: AC
  Administered 2017-07-19: 2.5 mg via ORAL
  Filled 2017-07-19: qty 1

## 2017-07-19 MED ORDER — ENOXAPARIN SODIUM 40 MG/0.4ML ~~LOC~~ SOLN
40.0000 mg | SUBCUTANEOUS | Status: DC
  Administered 2017-07-19 – 2017-08-12 (×24): 40 mg via SUBCUTANEOUS
  Filled 2017-07-19 (×24): qty 0.4

## 2017-07-19 MED ORDER — LEVALBUTEROL HCL 1.25 MG/0.5ML IN NEBU
1.2500 mg | INHALATION_SOLUTION | Freq: Four times a day (QID) | RESPIRATORY_TRACT | Status: DC
  Administered 2017-07-20 – 2017-07-24 (×20): 1.25 mg via RESPIRATORY_TRACT
  Filled 2017-07-19 (×21): qty 0.5

## 2017-07-19 MED ORDER — POTASSIUM CHLORIDE 10 MEQ/50ML IV SOLN
10.0000 meq | INTRAVENOUS | Status: AC | PRN
  Administered 2017-07-19 (×3): 10 meq via INTRAVENOUS
  Filled 2017-07-19 (×3): qty 50

## 2017-07-19 MED ORDER — MIDAZOLAM HCL 2 MG/2ML IJ SOLN
2.0000 mg | INTRAMUSCULAR | Status: DC | PRN
  Administered 2017-07-20 – 2017-07-24 (×3): 2 mg via INTRAVENOUS
  Filled 2017-07-19 (×3): qty 2

## 2017-07-19 MED ORDER — MOMETASONE FURO-FORMOTEROL FUM 200-5 MCG/ACT IN AERO
2.0000 | INHALATION_SPRAY | Freq: Two times a day (BID) | RESPIRATORY_TRACT | Status: DC
  Administered 2017-07-19 – 2017-07-20 (×2): 2 via RESPIRATORY_TRACT
  Filled 2017-07-19: qty 8.8

## 2017-07-19 MED ORDER — AMIODARONE HCL IN DEXTROSE 360-4.14 MG/200ML-% IV SOLN: 60.0000 mg/h | INTRAVENOUS | Status: DC

## 2017-07-19 MED ORDER — DEXTROSE 5 % IV SOLN
1.0000 g | Freq: Three times a day (TID) | INTRAVENOUS | Status: DC
  Administered 2017-07-19 – 2017-07-27 (×24): 1 g via INTRAVENOUS
  Filled 2017-07-19 (×25): qty 1

## 2017-07-19 MED ORDER — AMIODARONE IV BOLUS ONLY 150 MG/100ML
150.0000 mg | Freq: Once | INTRAVENOUS | Status: AC
  Administered 2017-07-19: 150 mg via INTRAVENOUS
  Filled 2017-07-19: qty 100

## 2017-07-19 MED ORDER — MAGNESIUM SULFATE 4 GM/100ML IV SOLN
4.0000 g | Freq: Once | INTRAVENOUS | Status: AC
  Administered 2017-07-19: 4 g via INTRAVENOUS
  Filled 2017-07-19: qty 100

## 2017-07-19 MED ORDER — LATANOPROST 0.005 % OP SOLN
1.0000 [drp] | Freq: Every day | OPHTHALMIC | Status: DC
  Administered 2017-07-19 – 2017-08-11 (×24): 1 [drp] via OPHTHALMIC
  Filled 2017-07-19 (×3): qty 2.5

## 2017-07-19 MED ORDER — AMIODARONE HCL IN DEXTROSE 360-4.14 MG/200ML-% IV SOLN
30.0000 mg/h | INTRAVENOUS | Status: DC
  Administered 2017-07-19 – 2017-07-21 (×3): 30 mg/h via INTRAVENOUS
  Filled 2017-07-19: qty 200

## 2017-07-19 MED ORDER — FUROSEMIDE 10 MG/ML IJ SOLN
80.0000 mg | Freq: Once | INTRAMUSCULAR | Status: AC
  Administered 2017-07-19: 80 mg via INTRAVENOUS
  Filled 2017-07-19: qty 8

## 2017-07-19 MED ORDER — FUROSEMIDE 10 MG/ML IJ SOLN
6.0000 mg/h | INTRAVENOUS | Status: DC
  Filled 2017-07-19: qty 25

## 2017-07-19 MED FILL — Heparin Sodium (Porcine) Inj 1000 Unit/ML: INTRAMUSCULAR | Qty: 30 | Status: AC

## 2017-07-19 MED FILL — Magnesium Sulfate Inj 50%: INTRAMUSCULAR | Qty: 10 | Status: AC

## 2017-07-19 MED FILL — Sodium Chloride IV Soln 0.9%: INTRAVENOUS | Qty: 1000 | Status: AC

## 2017-07-19 MED FILL — Potassium Chloride Inj 2 mEq/ML: INTRAVENOUS | Qty: 40 | Status: AC

## 2017-07-19 NOTE — Progress Notes (Signed)
NP Marni Griffon was preforming afternoon rounds and informed of patient condition. Informed of PRN medication given, all orders preformed this AM and increased work of breathing. At this same time HR team was rounding and also informed them of patient change of condition. Orders received and carried out. Will continue to monitor.

## 2017-07-19 NOTE — Progress Notes (Signed)
Patient ID: [name], female   DOB: [birth year], 77 y.o.   MRN: [id number] EVENING ROUNDS NOTE :     [address]       [place] [address]             [phone]                 2 Days Post-Op Procedure(s) (LRB): REMOVAL OF RVAD WITH PUMP STANDBY (N/A) TRANSESOPHAGEAL ECHOCARDIOGRAM (TEE) (N/A)  Total Length of Stay:  LOS: 9 days  BP (!) 124/59   Pulse 97   Temp (!) 97 F (36.1 C) (Oral)   Resp (!) 30   Ht 5\' 5"  (1.651 m)   Wt 205 lb 11 oz (93.3 kg)   SpO2 97%   BMI 34.23 kg/m   .Intake/Output      01/22 0701 - 01/23 0700 01/23 0701 - 01/24 0700   I.V. (mL/kg) 2145.4 (23) 658.1 (7.1)   Blood     NG/GT 1114    IV Piggyback 250    Total Intake(mL/kg) 3509.4 (37.6) 658.1 (7.1)   Urine (mL/kg/hr) 5730 (2.6) 3250 (2.9)   Emesis/NG output 130    Chest Tube 250    Total Output 6110 3250   Net -2600.6 -2591.9          . sodium chloride Stopped (07/18/17 1730)  . sodium chloride    . sodium chloride 1 mL/hr at 07/19/17 1700  . amiodarone 30 mg/hr (07/19/17 1342)  . amiodarone 60 mg/hr (07/19/17 1354)   Followed by  . amiodarone    . cefTAZidime (FORTAZ)  IV Stopped (07/19/17 1740)  . EPINEPHrine 4 mg in dextrose 5% 250 mL infusion (16 mcg/mL) Stopped (07/19/17 1705)  . feeding supplement (VITAL 1.5 CAL) Stopped (07/19/17 0550)  . furosemide (LASIX) infusion 5 mg/hr (07/19/17 1700)  . lactated ringers Stopped (07/17/17 1200)  . milrinone 0.25 mcg/kg/min (07/19/17 1700)  . norepinephrine (LEVOPHED) Adult infusion Stopped (07/19/17 1408)  . potassium chloride 10 mEq (07/19/17 1756)     Lab Results  Component Value Date   WBC 19.9 (H) 07/19/2017   HGB 10.2 (L) 07/19/2017   HCT 30.0 (L) 07/19/2017   PLT 286 07/19/2017   GLUCOSE 142 (H) 07/19/2017   CHOL 165 03/15/2017   TRIG 93 03/15/2017   HDL 57 03/15/2017   LDLCALC 89 03/15/2017   ALT 35 07/19/2017   AST 51 (H) 07/19/2017   NA 138 07/19/2017   K 3.3 (L) 07/19/2017   CL 96 (L)  07/19/2017   CREATININE 0.60 07/19/2017   BUN 14 07/19/2017   CO2 26 07/19/2017   TSH 0.990 03/15/2017   INR 1.20 07/17/2017   HGBA1C 5.2 07/06/2017   Patient is awake and alert, Levophed is off Sinus rhythm, on amnio drip White count is elevated to 19,000  Grace Isaac MD  Beeper [phone] Office [phone] 07/19/2017 7:00 PM

## 2017-07-19 NOTE — Progress Notes (Signed)
Gave Patient PRN dose of Lopressor 5mg  due to heart rate of 135. Will continue to monitor.

## 2017-07-19 NOTE — Progress Notes (Signed)
Patient had an increased work of breathing. Due ABG labwork and called Respiratory for other interventions.

## 2017-07-19 NOTE — Procedures (Signed)
Extubation Procedure Note  Patient Details:   Name: [name] DOB: 1941-01-30 MRN: [id number]   Airway Documentation:     Evaluation  O2 sats: stable throughout Complications: No apparent complications Patient did tolerate procedure well. Bilateral Breath Sounds: Rhonchi, Diminished   Yes  4l/min [place] placed Incentive spirometer instructed  Revonda Standard 07/19/2017, 9:55 AM

## 2017-07-19 NOTE — Op Note (Signed)
NAMEBETTI, GOODENOW NO.:  [id number]  MEDICAL RECORD NO.:  [id number]  LOCATION:  2H05C                        FACILITY:  [facility]  PHYSICIAN:  Ivin Poot, M.D.  DATE OF BIRTH:  09-Jan-1941  DATE OF PROCEDURE:  07/17/2017 DATE OF DISCHARGE:                              OPERATIVE REPORT   OPERATIONS: 1. Removal of right ventricular assist device. 2. Sternal closure.  SURGEON:  Ivin Poot, MD.  ASSISTANT:  Lucilla Lame, RNFA.  PREOPERATIVE DIAGNOSIS:  Right ventricular dysfunction, requiring right ventricular assist device.  POSTOPERATIVE DIAGNOSIS:  Resection of ascending aortic aneurysm and aortic valve replacement.  ANESTHESIA:  General.  CLINICAL NOTE:  The patient is a 77 year old female, who has been supported on right ventricular assist device for 6 days with significant improvement in hemodynamics, improvement in fluid status and renal function, and with intact neurologic function.  The speed on the right ventricular assist device has been decreased slowly in the 48 hours prior to the returning to the operating room.  She has tolerated this. She is brought to the operating room for removal of the right ventricular assist device and sternal closure.  Prior to surgery, I discussed the procedure in detail with the patient's husband and informed consent was obtained.  They understand the risks of bleeding, recurrent right ventricular assist failure, organ failure, infection, and death.  DESCRIPTION OF PROCEDURE:  The patient was brought directly from the ICU to the operating room and placed supine on the operating room table. Transesophageal echo probe was placed by the Anesthesia team.  This demonstrated normal LV systolic function and significantly improved RV function.  There was 2+ TR.  The patient was prepped and draped as a sterile field.  The previously placed chest tubes had been removed. Heparin was administered and the ACT was  documented as being over 200 seconds.  The previous sternal incision was opened.  The sternum had not been closed.  It was gently spread with a retractor.  Some clotted and non- clotted blood in the anterior mediastinum was irrigated and removed. The RV was directly examined and showed significant improvement with lateral wall contractility.  There was still 2+ TR.  The pump speed on the CentriMag RVAD was then slowly turned down.  When the speed reached 500 RPMs, the cannulas were clamped.  One cannula under the RV outflow tract through the chest and the other cannula drained the right atrium through the right femoral vein.  With the RVAD off and the cannula was clamped, the patient was observed for several minutes.  There was no evidence of decline in the hemodynamic status, blood pressure or inotropic support requirements.  The cannulas were then removed.  Cardiopulmonary bypass was not necessary, but was available.  There was no bleeding from the RV outflow tract pursestring. The right groin was held for 30 minutes by the clock to provide adequate hemostasis.  The heparin was reversed with protamine.  The mediastinum was irrigated with warm antibiotic irrigation.  New pleural and mediastinal drains were placed.  Pleural effusions were drained from each side.  The sternum was closed with interrupted steel wire.  The patient remained stable.  The pectoralis fascia was closed with a running #1 Vicryl.  The subcutaneous and skin layers were closed using running Vicryl and sterile dressings were applied.  A suture was placed in the right groin at the exit site of the venous cannula and a sterile dressing was applied.  The patient then returned to the ICU in stable condition.     Ivin Poot, M.D.     PV/MEDQ  D:  07/19/2017  T:  07/19/2017  Job:  [id number]  cc:   Shaune Pascal. Bensimhon, MD

## 2017-07-19 NOTE — Progress Notes (Signed)
Afternoon rounds  Interval history: Extubated earlier this a.m. after passing spontaneous breathing trial On afternoon rounds patient now in atrial fibrillation.  Some increased accessory muscle use and mild tachypnea.  She is diaphoretic.  Rounding at the same time his heart failure team, plan of care discussed  BP (!) 113/57   Pulse (!) 106   Temp 98.4 F (36.9 C) (Oral)   Resp (!) 26   Ht 5\' 5"  (1.651 m)   Wt 205 lb 11 oz (93.3 kg)   SpO2 94%   BMI 34.23 kg/m  CVP:  [7 mmHg-16 mmHg] 7 mmHg  . sodium chloride Stopped (07/18/17 1730)  . sodium chloride    . sodium chloride 1 mL/hr at 07/19/17 1100  . amiodarone 30 mg/hr (07/19/17 1342)  . amiodarone 60 mg/hr (07/19/17 1354)   Followed by  . amiodarone    . cefTAZidime (FORTAZ)  IV Stopped (07/19/17 1012)  . EPINEPHrine 4 mg in dextrose 5% 250 mL infusion (16 mcg/mL) 5.013 mcg/min (07/19/17 1100)  . feeding supplement (VITAL 1.5 CAL) Stopped (07/19/17 0550)  . furosemide (LASIX) infusion 5 mg/hr (07/19/17 1100)  . lactated ringers Stopped (07/17/17 1200)  . milrinone 0.25 mcg/kg/min (07/19/17 1100)  . norepinephrine (LEVOPHED) Adult infusion 4 mcg/min (07/19/17 1344)    Intake/Output Summary (Last 24 hours) at 07/19/2017 1407 Last data filed at 07/19/2017 1100 Gross per 24 hour  Intake 2448.99 ml  Output 6290 ml  Net -3841.01 ml   Exam Frail 77 year old white female currently a bit more labored in comparison to a.m. exam prior to extubation. HEENT: Normocephalic atraumatic left IJ dressing intact voice is hoarse, she has a weak cough Pulmonary: Scattered rhonchi basilar rales, mild accessory use, weaker cough Cardiac: Irregular irregular, atrial fibrillation on telemetry Extremity: Warm, dry, some generalized edema  Impression/plan Acute hypoxic respiratory failure in the setting of pulmonary edema. I worry the transition off positive pressure may be a significant demand on her heart.  Still clearly has room for  diuresis Plan Lasix bolus administered, drip continued all per heart failure team Placed on heated high flow, hoping higher liter flow will provide enough PEEP effect to augment preload and improve cardiac function Amiodarone bolused her cardiology team We will continue to evaluate, hopefully we can avoid reintubation  Erick Colace ACNP-BC [place] Pager # [phone] OR # [phone] if no answer

## 2017-07-19 NOTE — Progress Notes (Signed)
Rehab Admissions Coordinator Note:  Patient was screened by Retta Diones for appropriateness for an Inpatient Acute Rehab Consult.  At this time, we are recommending Inpatient Rehab consult.  Jodell Cipro M 07/19/2017, 2:21 PM  I can be reached at [phone].

## 2017-07-19 NOTE — Progress Notes (Signed)
Patient up with PT with no distress noted. Informed patient that will be preforming bath if patient was able to tolerate. Verbalized understanding and family at bedside made aware. Will continue to monitor.

## 2017-07-19 NOTE — Progress Notes (Signed)
Advanced Heart Failure Rounding Note  Primary Cardiologist: Dr. Percival Spanish (2014) HF: (New) Dr. Haroldine Laws   Subjective:    Events - 07/10/17 S/p AVR and ascending AA replacement  - 07/10/17 VF Arrest in evening with shock x 1 - 07/11/17 RVAD placement  - 07/17/2017 RVAD removed with chest closed  Currently on NE 9 mcg, milrinone 0.25 mcg, Epi 5 mcg, and amio 30 mg. Also on lasix drip at 5 mg per hour.   Awake on the vent. Follows commands.   Echo 06/02/2017 LVEF 55-60%, Grade 1 DD, Mild/Mod AI with LVOT, Mid ascending aortic diameter 48.88 mm, Mild LAE, RV normal  Objective:   Weight Range: 205 lb 11 oz (93.3 kg) Body mass index is 34.23 kg/m.   Vital Signs:   Temp:  [98.4 F (36.9 C)-100.1 F (37.8 C)] 98.6 F (37 C) (01/23 0700) Pulse Rate:  [85-104] 96 (01/23 0759) Resp:  [7-57] 34 (01/23 0759) BP: (76-129)/(44-84) 120/53 (01/23 0759) SpO2:  [93 %-100 %] 98 % (01/23 0759) Arterial Line BP: (73-160)/(33-70) 122/60 (01/23 0700) FiO2 (%):  [40 %-50 %] 40 % (01/23 0759) Weight:  [205 lb 11 oz (93.3 kg)] 205 lb 11 oz (93.3 kg) (01/23 0500) Last BM Date: (PTA)  Weight change: Filed Weights   07/17/17 0500 07/18/17 0600 07/19/17 0500  Weight: 208 lb 8.9 oz (94.6 kg) 214 lb 1.1 oz (97.1 kg) 205 lb 11 oz (93.3 kg)    Intake/Output:   Intake/Output Summary (Last 24 hours) at 07/19/2017 0839 Last data filed at 07/19/2017 0600 Gross per 24 hour  Intake 3381.78 ml  Output 6015 ml  Net -2633.22 ml    Physical Exam   General:  Intubated.  HEENT: normal Neck: supple. no JVD. Carotids 2+ bilat; no bruits. No lymphadenopathy or thryomegaly appreciated. Cor: PMI nondisplaced. Regular rate & rhythm. No rubs, gallops or murmurs. Left subclavian Lungs: clear Abdomen: soft, nontender, nondistended. No hepatosplenomegaly. No bruits or masses. Good bowel sounds. Extremities: no cyanosis, clubbing, rash, edema. RUE PICC R brachial A line. RLE and LLE SCDs.  Neuro: Awake.  MAE. GU: foley clear urine.     Telemetry  NSR 90-100s     EKG    No new tracings.    Labs    CBC Recent Labs    07/17/17 0346  07/18/17 1742 07/18/17 1801 07/19/17 0415  WBC 12.5*   < > 17.1*  --  15.4*  NEUTROABS 9.0*  --   --   --   --   HGB 7.8*   < > 9.0* 8.8* 8.8*  HCT 23.0*   < > 26.6* 26.0* 26.1*  MCV 88.5   < > 88.4  --  89.4  PLT 153   < > 207  --  222   < > = values in this interval not displayed.   Basic Metabolic Panel Recent Labs    07/17/17 0346  07/18/17 0359 07/18/17 1742 07/18/17 1801 07/19/17 0415  NA 138   < > 134*  --  137 137  K 4.2   < > 4.1  --  3.7 3.3*  CL 100*   < > 101  --  97* 101  CO2 28  --  23  --   --  23  GLUCOSE 126*   < > 101*  --  127* 135*  BUN 8   < > 13  --  15 15  CREATININE 0.57   < > 0.59 0.69 0.60 0.78  CALCIUM  8.0*  --  7.4*  --   --  6.8*  MG 1.3*   < > 2.0 1.7  --  1.4*  PHOS 3.8  --   --   --   --   --    < > = values in this interval not displayed.   Liver Function Tests No results for input(s): AST, ALT, ALKPHOS, BILITOT, PROT, ALBUMIN in the last 72 hours. No results for input(s): LIPASE, AMYLASE in the last 72 hours. Cardiac Enzymes No results for input(s): CKTOTAL, CKMB, CKMBINDEX, TROPONINI in the last 72 hours.  BNP: BNP (last 3 results) No results for input(s): BNP in the last 8760 hours.  ProBNP (last 3 results) No results for input(s): PROBNP in the last 8760 hours.   D-Dimer No results for input(s): DDIMER in the last 72 hours. Hemoglobin A1C No results for input(s): HGBA1C in the last 72 hours. Fasting Lipid Panel No results for input(s): CHOL, HDL, LDLCALC, TRIG, CHOLHDL, LDLDIRECT in the last 72 hours. Thyroid Function Tests No results for input(s): TSH, T4TOTAL, T3FREE, THYROIDAB in the last 72 hours.  Invalid input(s): FREET3  Other results:   Imaging    No results found.  Medications:    Scheduled Medications: . acetaminophen  1,000 mg Oral Q6H   Or  . acetaminophen  (TYLENOL) oral liquid 160 mg/5 mL  1,000 mg Per Tube Q6H  . acetaminophen (TYLENOL) oral liquid 160 mg/5 mL  650 mg Per Tube Once   Or  . acetaminophen  650 mg Rectal Once  . aspirin EC  325 mg Oral Daily   Or  . aspirin  324 mg Per Tube Daily  . bisacodyl  10 mg Oral Daily   Or  . bisacodyl  10 mg Rectal Daily  . chlorhexidine gluconate (MEDLINE KIT)  15 mL Mouth Rinse BID  . Chlorhexidine Gluconate Cloth  6 each Topical Daily  . docusate sodium  200 mg Oral Daily  . enoxaparin (LOVENOX) injection  40 mg Subcutaneous Q24H  . insulin aspart  0-24 Units Subcutaneous Q4H  . insulin detemir  10 Units Subcutaneous BID  . latanoprost  1 drop Both Eyes QHS  . mouth rinse  15 mL Mouth Rinse 10 times per day  . naphazoline-glycerin  1-2 drop Both Eyes BID  . pantoprazole (PROTONIX) IV  40 mg Intravenous Q12H  . QUEtiapine  50 mg Oral QHS  . sodium chloride flush  10-40 mL Intracatheter Q12H  . sodium chloride flush  10-40 mL Intracatheter Q12H  . sodium chloride flush  3 mL Intravenous Q12H  . venlafaxine XR  75 mg Oral Daily    Infusions: . sodium chloride Stopped (07/18/17 1730)  . sodium chloride    . sodium chloride 1 mL/hr at 07/19/17 0600  . amiodarone 30 mg/hr (07/19/17 0600)  . cefTAZidime (FORTAZ)  IV    . EPINEPHrine 4 mg in dextrose 5% 250 mL infusion (16 mcg/mL) 5.013 mcg/min (07/19/17 0600)  . feeding supplement (VITAL 1.5 CAL) Stopped (07/19/17 0550)  . furosemide (LASIX) infusion 5 mg/hr (07/19/17 0832)  . lactated ringers Stopped (07/17/17 1200)  . milrinone 0.25 mcg/kg/min (07/19/17 0830)  . norepinephrine (LEVOPHED) Adult infusion 7.04 mcg/min (07/19/17 0600)  . potassium chloride 10 mEq (07/19/17 0833)    PRN Medications: sodium chloride, fentaNYL (SUBLIMAZE) injection, metoprolol tartrate, midazolam, ondansetron (ZOFRAN) IV, oxyCODONE, potassium chloride, sodium chloride flush    Patient Profile   [name] is a 77 y.o. female with history of  fusiform  ascending aortic aneurysm, HTN, and HLD.   Admitted 07/10/17 for planned AVR and replacement of ascending aortic aneurysm. Developed acute cardiogenic shock + VF arrest. Taken back to OR am of 07/11/17 for evacuation of hematoma and placement of RVAD.   Assessment/Plan   1. Cardiogenic shock s/p cardiac surgery with R>L CHF. - Pre-op Echo 06/02/2017 LVEF 55-60%, Grade 1 DD, Mild/Mod AI with LVOT, Mid ascending aortic diameter 48.88 mm, Mild LAE, RV normal - S/P RVAD removal 07/17/2017  - On epi 4 mcg, NE 7 mcg, milrinone 0.25 mcg, amio 30 mg per hour  - CVP will be followed later today. Volume status improving. Continue  Lasix drip 5 mg per hour.    . Per CT surgery .  -2. VF arrest - Stable. No further arrythmia.  - Goal K > 4.0 and Mg > 2.0. Supp aggressively K 3.3 Supplement K 3. Atrial fibrillation - Remains in NSR  - Continue amio 30 mg per hour.  4. Acute respiratory failure - Intubated and sedated.   Managed by CCM CXR decreased edema.   5. S/p AVR and Ascending aortic aneurysm replacement. - Per surgery. No change. 6. AKI Creatinine 0.78  7. Anemia  hgb 8.8   Length of Stay: [place], NP  8:39 AM Advanced Heart Failure Team Pager [phone] (M-F; 7a - 4p)  Please contact [facility] for night-coverage after hours (4p -7a ) and weekends on [email or web address]   Agree With above.   She was extubated earlier today. Remains on epi and NE and milrinone. In and out of AF with RVR today. Currently in AF. Respiratory status has worsened throughout the day. No quite tachypneic. Lasix cut back.   Exam Sitting up in bed. Tachypneic and hoarse  JVP to jaw Cor tachy irregular  Sternal dressing ok Lungs + rhonchi Ab soft NT Ext 2-3+ edema.  She remains very tenuous. At high risk for reintubation. Weight up 37 pounds from pre-op weight. Will give lasix 80 mg IV + metolazone 2.5. Bolus IV amio. Discussed with CCM who will also adjust setting on O2 support. Discussed dosing with  PharmD personally.   CRITICAL CARE Performed by: Glori Bickers  Total critical care time: 35 minutes  Critical care time was exclusive of separately billable procedures and treating other patients.  Critical care was necessary to treat or prevent imminent or life-threatening deterioration.  Critical care was time spent personally by me (independent of midlevel providers or residents) on the following activities: development of treatment plan with patient and/or surrogate as well as nursing, discussions with consultants, evaluation of patient's response to treatment, examination of patient, obtaining history from patient or surrogate, ordering and performing treatments and interventions, ordering and review of laboratory studies, ordering and review of radiographic studies, pulse oximetry and re-evaluation of patient's condition.  Glori Bickers, MD  2:17 PM

## 2017-07-19 NOTE — Evaluation (Signed)
Physical Therapy Evaluation Patient Details Name: CAMREIGH MICHIE MRN: [id number] DOB: 03-Aug-1940 Today's Date: 07/19/2017   History of Present Illness  Pt admitted 07/10/17 for AVR and ascending AA replacement. Post procedure had VF arrest. 07/11/17 back to OR to evacuate hematoma and place RVAD due to fluid overload and R systolic HF. RVAD subsequently removed. Pt intubated from 07/10/17-07/19/17. PMH: PVD, afib, vfib, hypothyroidism, anxiety and depression, HTN, HLD, R TKA.  Clinical Impression  Pt presents with an overall decrease in functional mobility secondary to above. PTA, pt mod indep with intermittent use of SPC and lives with husband. Today, required totalA+2 for bed mobility and sat EOB >10 min with min-maxA; able to partially stand with maxA+2. Pt easily fatigued this session, but shows good potential for mobility progression. Would benefit from CIR-level therapies at d/c to maximize functional mobility and independence in order to return home mod indep. Will follow acutely to address established goals.     Follow Up Recommendations CIR    Equipment Recommendations  (TBD next venue)    Recommendations for Other Services Rehab consult     Precautions / Restrictions Precautions Precautions: Fall;Sternal Precaution Comments: began education in sternal precautions Restrictions Other Position/Activity Restrictions: Sternal      Mobility  Bed Mobility Overal bed mobility: Needs Assistance Bed Mobility: Rolling;Sidelying to Sit;Sit to Sidelying Rolling: Total assist;+2 for physical assistance Sidelying to sit: Total assist;+2 for physical assistance     Sit to sidelying: Total assist;+2 for physical assistance General bed mobility comments: instructed in log roll method to minimize abdominal pain  Transfers Overall transfer level: Needs assistance Equipment used: 2 person hand held assist Transfers: Sit to/from Stand Sit to Stand: Max assist;+2 physical assistance          General transfer comment: pt able to perform brief, partial stand from EOB with assist to rise and steady, cues for hands on knees  Ambulation/Gait                Stairs            Wheelchair Mobility    Modified Rankin (Stroke Patients Only)       Balance Overall balance assessment: Needs assistance Sitting-balance support: Feet supported Sitting balance-Leahy Scale: Poor       Standing balance-Leahy Scale: Zero                               Pertinent Vitals/Pain Pain Assessment: Faces Faces Pain Scale: Hurts even more Pain Location: abdomen Pain Descriptors / Indicators: Grimacing;Guarding;Moaning Pain Intervention(s): Monitored during session;Limited activity within patient's tolerance;Repositioned    Home Living Family/patient expects to be discharged to:: Private residence Living Arrangements: Spouse/significant other Available Help at Discharge: Family;Available 24 hours/day Type of Home: Mobile home Home Access: Stairs to enter Entrance Stairs-Rails: Right;Left;Can reach both Entrance Stairs-Number of Steps: 4 Home Layout: One level Home Equipment: Cane - single point Additional Comments: ? may have 02 in home    Prior Function Level of Independence: Independent with assistive device(s)         Comments: walked with cane, sponge bathed and washed hair over sink, worked together with husband on housekeeping and cooking, drives rarely     Hand Dominance   Dominant Hand: Right    Extremity/Trunk Assessment   Upper Extremity Assessment Upper Extremity Assessment: RUE deficits/detail;LUE deficits/detail RUE Deficits / Details: longstanding shoulder limitations, generalized weakness RUE Coordination: decreased gross motor LUE Deficits /  Details: longstanding shoulder limitations, generalized weakness LUE Coordination: decreased gross motor    Lower Extremity Assessment Lower Extremity Assessment: Generalized weakness        Communication   Communication: Expressive difficulties(low volume s/p extubation)  Cognition Arousal/Alertness: Awake/alert Behavior During Therapy: WFL for tasks assessed/performed Overall Cognitive Status: Within Functional Limits for tasks assessed                                 General Comments: somewhat difficult to assess cognition due to low volume      General Comments General comments (skin integrity, edema, etc.): SpO2 >90% on 4L O2 [place]. Supine BP 113/63, seated BP 113/57, return to supine BP 126/75    Exercises     Assessment/Plan    PT Assessment Patient needs continued PT services  PT Problem List Decreased strength;Decreased range of motion;Decreased activity tolerance;Decreased balance;Decreased mobility;Decreased knowledge of use of DME;Cardiopulmonary status limiting activity       PT Treatment Interventions DME instruction;Gait training;Stair training;Functional mobility training;Therapeutic activities;Therapeutic exercise;Balance training;Patient/family education    PT Goals (Current goals can be found in the Care Plan section)  Acute Rehab PT Goals Patient Stated Goal: to get stronger PT Goal Formulation: With patient Time For Goal Achievement: 08/02/17 Potential to Achieve Goals: Good    Frequency Min 3X/week   Barriers to discharge        Co-evaluation PT/OT/SLP Co-Evaluation/Treatment: Yes Reason for Co-Treatment: For patient/therapist safety;To address functional/ADL transfers PT goals addressed during session: Mobility/safety with mobility;Balance OT goals addressed during session: ADL's and self-care;Strengthening/ROM       AM-PAC PT "6 Clicks" Daily Activity  Outcome Measure Difficulty turning over in bed (including adjusting bedclothes, sheets and blankets)?: Unable Difficulty moving from lying on back to sitting on the side of the bed? : Unable Difficulty sitting down on and standing up from a chair with arms (e.g.,  wheelchair, bedside commode, etc,.)?: Unable Help needed moving to and from a bed to chair (including a wheelchair)?: A Lot Help needed walking in hospital room?: Total Help needed climbing 3-5 steps with a railing? : Total 6 Click Score: 7    End of Session Equipment Utilized During Treatment: Gait belt;Oxygen Activity Tolerance: Patient tolerated treatment well Patient left: in bed;with call bell/phone within reach Nurse Communication: Mobility status;Other (comment)(Need for getting washed up and bed change) PT Visit Diagnosis: Other abnormalities of gait and mobility (R26.89);Muscle weakness (generalized) (M62.81)    Time: 1655-3748 PT Time Calculation (min) (ACUTE ONLY): 31 min   Charges:   PT Evaluation $PT Eval Moderate Complexity: 1 Mod     PT G Codes:       Mabeline Caras, PT, DPT Acute Rehab Services  Pager: [place] 07/19/2017, 11:37 AM

## 2017-07-19 NOTE — Evaluation (Signed)
Occupational Therapy Evaluation Patient Details Name: [name] MRN: [id number] DOB: [birth year] Today's Date: 07/19/2017    History of Present Illness Pt admitted 07/10/17 for AVR and ascending AA replacement. Post procedure had VF arrest. 07/11/17 back to OR to evacuate hematoma and place RVAD due to fluid overload and R systolic HF. RVAD subsequently removed. Pt intubated from 07/10/17-07/19/17. PMH: PVD, afib, vfib, hypothyroidism, anxiety and depression, HTN, HLD, R TKA.   Clinical Impression   Pt ambulates with a cane and performs ADL independently. She has longstanding shoulder ROM limitations. Pt presents with significant weakness with prolonged, complicated hospitalization. She demonstrates poor sitting balance and was able to attempt partial stand x 1 with 2 person assist. Pt with stable vital signs throughout session. She currently requires total assist for ADL. Pt will need intensive rehab prior to return home with her husband. Will follow acutely.    Follow Up Recommendations  CIR    Equipment Recommendations  3 in 1 bedside commode    Recommendations for Other Services Rehab consult     Precautions / Restrictions Precautions Precautions: Fall;Sternal Precaution Comments: began education in sternal precautions Restrictions Other Position/Activity Restrictions: Sternal      Mobility Bed Mobility Overal bed mobility: Needs Assistance Bed Mobility: Rolling;Sidelying to Sit;Sit to Sidelying Rolling: Total assist;+2 for physical assistance Sidelying to sit: Total assist;+2 for physical assistance     Sit to sidelying: Total assist;+2 for physical assistance General bed mobility comments: instructed in log roll method to minimize abdominal pain  Transfers Overall transfer level: Needs assistance Equipment used: 2 person hand held assist Transfers: Sit to/from Stand Sit to Stand: Max assist;+2 physical assistance         General transfer comment: pt able to  perform brief, partial stand from EOB with assist to rise and steady, cues for hands on knees    Balance Overall balance assessment: Needs assistance Sitting-balance support: Feet supported Sitting balance-Leahy Scale: Poor       Standing balance-Leahy Scale: Zero                             ADL either performed or assessed with clinical judgement   ADL                                         General ADL Comments: Pt currently requiring total assist.     Vision Patient Visual Report: No change from baseline       Perception     Praxis      Pertinent Vitals/Pain Pain Assessment: Faces Faces Pain Scale: Hurts even more Pain Location: abdomen Pain Descriptors / Indicators: Grimacing;Guarding;Moaning Pain Intervention(s): Monitored during session;Limited activity within patient's tolerance;Repositioned     Hand Dominance Right   Extremity/Trunk Assessment Upper Extremity Assessment Upper Extremity Assessment: RUE deficits/detail;LUE deficits/detail RUE Deficits / Details: longstanding shoulder limitations, generalized weakness RUE Coordination: decreased gross motor LUE Deficits / Details: longstanding shoulder limitations, generalized weakness LUE Coordination: decreased gross motor   Lower Extremity Assessment Lower Extremity Assessment: Generalized weakness       Communication Communication Communication: Expressive difficulties(low volume s/p extubation)   Cognition Arousal/Alertness: Awake/alert Behavior During Therapy: WFL for tasks assessed/performed Overall Cognitive Status: Within Functional Limits for tasks assessed  General Comments: somewhat difficult to assess cognition due to low volume   General Comments       Exercises     Shoulder Instructions      Home Living Family/patient expects to be discharged to:: Private residence Living Arrangements: Spouse/significant  other Available Help at Discharge: Family;Available 24 hours/day Type of Home: Mobile home Home Access: Stairs to enter Entrance Stairs-Number of Steps: 4 Entrance Stairs-Rails: Right;Left;Can reach both Home Layout: One level     Bathroom Shower/Tub: Occupational psychologist: Handicapped height     Home Equipment: Kasandra Knudsen - single point   Additional Comments: ? may have 02 in home      Prior Functioning/Environment Level of Independence: Independent with assistive device(s)        Comments: walked with cane, sponge bathed and washed hair over sink, worked together with husband on housekeeping and cooking, drives rarely        OT Problem List: Decreased strength;Decreased range of motion;Decreased activity tolerance;Impaired balance (sitting and/or standing);Decreased coordination;Decreased knowledge of use of DME or AE;Decreased knowledge of precautions;Cardiopulmonary status limiting activity;Pain;Obesity      OT Treatment/Interventions: Self-care/ADL training;DME and/or AE instruction;Therapeutic activities;Energy conservation;Patient/family education;Balance training    OT Goals(Current goals can be found in the care plan section) Acute Rehab OT Goals Patient Stated Goal: to get stronger OT Goal Formulation: With patient Time For Goal Achievement: 08/02/17 Potential to Achieve Goals: Good ADL Goals Pt Will Perform Eating: Independently;sitting Pt Will Perform Grooming: with min assist;sitting Pt Will Perform Upper Body Dressing: with min assist;sitting Pt Will Transfer to Toilet: with mod assist;stand pivot transfer;bedside commode Pt Will Perform Toileting - Clothing Manipulation and hygiene: with min assist;sit to/from stand Additional ADL Goal #1: Pt will state sternal precautions. Additional ADL Goal #2: Pt will sit EOB x 10 minutes with supervision in preparation for ADL.  OT Frequency: Min 2X/week   Barriers to D/C:            Co-evaluation  PT/OT/SLP Co-Evaluation/Treatment: Yes Reason for Co-Treatment: For patient/therapist safety;To address functional/ADL transfers PT goals addressed during session: Mobility/safety with mobility;Balance OT goals addressed during session: ADL's and self-care;Strengthening/ROM      AM-PAC PT "6 Clicks" Daily Activity     Outcome Measure Help from another person eating meals?: Total Help from another person taking care of personal grooming?: Total Help from another person toileting, which includes using toliet, bedpan, or urinal?: Total Help from another person bathing (including washing, rinsing, drying)?: Total Help from another person to put on and taking off regular upper body clothing?: Total Help from another person to put on and taking off regular lower body clothing?: Total 6 Click Score: 6   End of Session Equipment Utilized During Treatment: Oxygen Nurse Communication: Mobility status(aware R PICC is leaking)  Activity Tolerance: Patient tolerated treatment well(VSS) Patient left: in bed;with call bell/phone within reach  OT Visit Diagnosis: Unsteadiness on feet (R26.81);Pain;Muscle weakness (generalized) (M62.81)                Time: 9509-3267 OT Time Calculation (min): 29 min Charges:  OT General Charges $OT Visit: 1 Visit OT Evaluation $OT Eval Moderate Complexity: 1 Mod G-Codes:     07-23-2017 Nestor Lewandowsky, OTR/L Pager: [phone]  Werner Lean, Haze Boyden 07-23-2017, 11:36 AM

## 2017-07-19 NOTE — Progress Notes (Signed)
  Amiodarone Drug - Drug Interaction Consult Note  Recommendations: Monitor QTc as patient is on Seroquel 50mg  PO qhs. Monitor electrolytes closely with Lasix infusion  Amiodarone is metabolized by the cytochrome P450 system and therefore has the potential to cause many drug interactions. Amiodarone has an average plasma half-life of 50 days (range 20 to 100 days).   There is potential for drug interactions to occur several weeks or months after stopping treatment and the onset of drug interactions may be slow after initiating amiodarone.   []  Statins: Increased risk of myopathy. Simvastatin- restrict dose to 20mg  daily. Other statins: counsel patients to report any muscle pain or weakness immediately.  []  Anticoagulants: Amiodarone can increase anticoagulant effect. Consider warfarin dose reduction. Patients should be monitored closely and the dose of anticoagulant altered accordingly, remembering that amiodarone levels take several weeks to stabilize.  []  Antiepileptics: Amiodarone can increase plasma concentration of phenytoin, the dose should be reduced. Note that small changes in phenytoin dose can result in large changes in levels. Monitor patient and counsel on signs of toxicity.  []  Beta blockers: increased risk of bradycardia, AV block and myocardial depression. Sotalol - avoid concomitant use.  []   Calcium channel blockers (diltiazem and verapamil): increased risk of bradycardia, AV block and myocardial depression.  []   Cyclosporine: Amiodarone increases levels of cyclosporine. Reduced dose of cyclosporine is recommended.  []  Digoxin dose should be halved when amiodarone is started.  [x]  Diuretics: increased risk of cardiotoxicity if hypokalemia occurs.  []  Oral hypoglycemic agents (glyburide, glipizide, glimepiride): increased risk of hypoglycemia. Patient's glucose levels should be monitored closely when initiating amiodarone therapy.   [x]  Drugs that prolong the QT interval:   Torsades de pointes risk may be increased with concurrent use - avoid if possible.  Monitor QTc, also keep magnesium/potassium WNL if concurrent therapy can't be avoided. Marland Kitchen Antibiotics: e.g. fluoroquinolones, erythromycin. . Antiarrhythmics: e.g. quinidine, procainamide, disopyramide, sotalol. . Antipsychotics: e.g. phenothiazines, haloperidol.  . Lithium, tricyclic antidepressants, and methadone.   Thank You,  Jonnette Nuon D. Avian Greenawalt, PharmD, BCPS Clinical Pharmacist Clinical Phone for 07/19/2017 until 3:30pm: [phone] If after 3:30pm, please call main pharmacy at [phone] 07/19/2017 1:53 PM

## 2017-07-19 NOTE — Progress Notes (Signed)
.. ..  Name: [name] MRN: [id number] DOB: February 20, 1941    ADMISSION DATE:  07/10/2017  CONSULTATION DATE:  07/12/17  REFERRING MD :  Nils Pyle MD  CHIEF COMPLAINT:   BRIEF PATIENT DESCRIPTION: SIGNIFICANT EVENTS  77 yr old female POD 1 TEE, Open Heart with evacuation of Hematoma and placement of RVAD.  STUDIES: Echo12/12/2016 LVEF 55-60%, Grade 1 DD, Mild/Mod AI with LVOT, Mid ascending aortic diameter 48.88 mm, Mild LAE, RV normal    BRIEF:   77 year old female with past medical history significant for fusiform ascending aortic aneurysm, hypertension, hyperlipidemia, status post AVR and replacement of ascending aortic aneurysm.  On 07/10/2017 postprocedure patient developed acute cardiogenic shock had an episode of V. fib arrest was taken back to the OR on 07/11/2017 for evacuation of hematoma and placement of RVAD.  PCCM consulted for ventilator management.   Events - 07/10/17 Admit - 07/10/17 S/p AVR and ascending AA replacement  - 07/10/17 VF Arrest in evening with shock x 1 - 07/11/17 to OR for evacuation of hematoma and RVAD placement  - 07/12/17 PCCM consult 07/15/2017 Lasix per cardiology CVP of 5 1/22 heavily diuresed  1/23 ready for extubation  SUBJECTIVE/OVERNIGHT/INTERVAL HX Ready for extubation   VITAL SIGNS: Temp:  [98.4 F (36.9 C)-100.1 F (37.8 C)] 98.6 F (37 C) (01/23 0700) Pulse Rate:  [85-104] 96 (01/23 0759) Resp:  [7-57] 34 (01/23 0759) BP: (76-129)/(44-84) 120/53 (01/23 0759) SpO2:  [93 %-100 %] 98 % (01/23 0759) Arterial Line BP: (73-160)/(33-70) 122/60 (01/23 0700) FiO2 (%):  [40 %-50 %] 40 % (01/23 0759) Weight:  [205 lb 11 oz (93.3 kg)] 205 lb 11 oz (93.3 kg) (01/23 0500)  PHYSICAL EXAMINATION:  General: 77 year old female. Awake, alert. Following commands. Looks comfortable on PSV HENT: NCAT. Sclera are not icteric. No JVD. MMM orally intubated Pulm: scattered rhonchi. Excellent F/Vt on PSV of 5, no accessory use. Mediastinal tubes  unremarkable. Sternal dressing intact Card: RRR no MRG Abd: soft + bowel sounds  EXT/MS: generalized anasarca (improving). Warm and dry Neuro: awake following commands.   PULMONARY Recent Labs  Lab 07/17/17 0932  07/17/17 1156 07/17/17 1715 07/17/17 1852 07/18/17 0404 07/18/17 0755 07/18/17 1801 07/19/17 0405 07/19/17 0828  PHART 7.438  --  7.361  --  7.381 7.400  --   --   --  7.486*  PCO2ART 40.4  --  43.7  --  41.6 37.2  --   --   --  35.1  PO2ART 139.0*  --  109.0*  --  63.0* 64.0*  --   --   --  74.0*  HCO3 27.3  --  25.0  --  24.7 23.0  --   --   --  26.5  TCO2 29   < > 26 24 26 24   --  25  --  28  O2SAT 99.0  --  98.0  --  92.0 92.0 55.2  --  97.9 96.0   < > = values in this interval not displayed.    CBC Recent Labs  Lab 07/18/17 0359 07/18/17 1742 07/18/17 1801 07/19/17 0415  HGB 9.5* 9.0* 8.8* 8.8*  HCT 28.8* 26.6* 26.0* 26.1*  WBC 24.1* 17.1*  --  15.4*  PLT 198 207  --  222    COAGULATION Recent Labs  Lab 07/17/17 1144  INR 1.20    CARDIAC  No results for input(s): TROPONINI in the last 168 hours. No results for input(s): PROBNP in the last 168  hours.   CHEMISTRY Recent Labs  Lab 07/14/17 0217  07/15/17 0231  07/16/17 0233  07/17/17 0346  07/17/17 1049 07/17/17 1141 07/17/17 1709 07/17/17 1715 07/18/17 0359 07/18/17 1742 07/18/17 1801 07/19/17 0415  NA 135   < > 134*   < > 134*   < > 138   < > 136 137  --  134* 134*  --  137 137  K 3.8   < > 3.7   < > 4.2   < > 4.2   < > 3.4* 3.6  --  4.3 4.1  --  3.7 3.3*  CL 96*   < > 95*   < > 98*   < > 100*   < > 96*  --   --  98* 101  --  97* 101  CO2 28  --  29  --  26  --  28  --   --   --   --   --  23  --   --  23  GLUCOSE 178*   < > 108*   < > 175*   < > 126*   < > 113* 113*  --  135* 101*  --  127* 135*  BUN 14   < > 12   < > 10   < > 8   < > 8  --   --  11 13  --  15 15  CREATININE 0.75   < > 0.62   < > 0.58   < > 0.57   < > 0.50  --  0.55 0.50 0.59 0.69 0.60 0.78  CALCIUM 7.8*  --  8.9   --  8.7*  --  8.0*  --   --   --   --   --  7.4*  --   --  6.8*  MG 1.5*  --  1.5*  --  1.5*  --  1.3*  --   --   --  2.6*  --  2.0 1.7  --  1.4*  PHOS 4.1  --  4.5  --  5.6*  --  3.8  --   --   --   --   --   --   --   --   --    < > = values in this interval not displayed.   Estimated Creatinine Clearance: 68.6 mL/min (by C-G formula based on SCr of 0.78 mg/dL).   LIVER Recent Labs  Lab 07/13/17 0405 07/14/17 0217 07/15/17 0231 07/17/17 1144  AST 77* 49* 34  --   ALT 41 32 25  --   ALKPHOS 50 61 73  --   BILITOT 1.7* 1.0 1.1  --   PROT 4.5* 4.7* 4.4*  --   ALBUMIN 2.9* 2.6* 2.3*  --   INR  --   --   --  1.20    INFECTIOUS Recent Labs  Lab 07/13/17 2331 07/15/17 0231 07/16/17 0233  LATICACIDVEN 1.6  --   --   PROCALCITON  --  0.67 0.50    ENDOCRINE CBG (last 3)  Recent Labs    07/19/17 0000 07/19/17 0335 07/19/17 0801  GLUCAP 151* 140* 76     IMAGING x48h  - image(s) personally visualized  -   highlighted in bold Dg Chest 1 View  Result Date: 07/17/2017 CLINICAL DATA:  Status post removal of right ventricular assist device. Evaluate for pneumothorax. EXAM: CHEST 1 VIEW COMPARISON:  Portable chest x-ray  of earlier today FINDINGS: The lungs are hypoinflated. There is no pneumothorax or significant pleural effusion. The interstitial markings remain increased. The cardiac silhouette is enlarged. The pulmonary vascularity is slightly less prominent than on the earlier study today. The right chest tube appears to have been advanced such that its tip overlies the posterolateral interspace between the fifth and sixth ribs. A new left chest tube is present whose tip projects over the posterior aspect of the left 6th rib. The endotracheal tube tip lies approximately 2 cm above the carina. The feeding tube tip projects below the inferior margin of the image. The right ventricular assist device has been removed. The Swan-Ganz catheter tip is stable in the main pulmonary outflow  tract. The prosthetic aortic valve is stable in position. The left subclavian venous catheter tip projects over the proximal SVC. IMPRESSION: Status post removal of the right ventricular assist device. Repositioning of the right chest tube. New left chest tube. No pneumothorax. CHF with mild interstitial edema. The remaining support tubes are in reasonable position. Electronically Signed   By: David  Martinique M.D.   On: 07/17/2017 11:21   Dg Chest Port 1 View  Result Date: 07/19/2017 CLINICAL DATA:  Status post removal of right ventricular assist device EXAM: PORTABLE CHEST 1 VIEW COMPARISON:  07/18/2017 FINDINGS: The ET tube tip is above the carina. Bilateral chest tubes and mediastinal drain remain in place. Interval removal of right IJ catheter. A right arm PICC line is noted with tip at the cavoatrial junction. No pleural effusion or edema. Subsegmental atelectasis in the right upper lobe and left base appear unchanged. IMPRESSION: 1. Right upper lobe and left base atelectasis. 2. Bilateral chest tubes in place without evidence for pneumothorax. Electronically Signed   By: Kerby Moors M.D.   On: 07/19/2017 08:49   Dg Chest Port 1 View  Result Date: 07/18/2017 CLINICAL DATA:  Status post removal of right ventricular assist device yesterday and chest closed. EXAM: PORTABLE CHEST 1 VIEW COMPARISON:  Portable chest x-ray of July 17, 2017 at 10:56 a.m. FINDINGS: The right lung remains mildly hypoinflated. The left lung is better inflated. There is no pneumothorax. There is patchy density in the right upper lobe which is stable. There is left lower lobe density which has improved since yesterday's study. The interstitial markings of both lungs are increased. The pulmonary vascularity is no longer engorged. The cardiac silhouette remains enlarged. The Swan-Ganz catheter tip projects in the proximal right main pulmonary outflow tract. The bilateral chest tubes and the mediastinal drain are in stable  position. The endotracheal tube tip projects 3 cm above the carina. The feeding tube tip projects below the inferior margin of the image. The left subclavian venous catheter tip projects over the junction of the proximal and midportions of the SVC. IMPRESSION: Improved appearance of both lungs with decreased pulmonary edema. Persistent subsegmental atelectasis in the right upper lobe and at the left base. The support tubes are in reasonable position. Electronically Signed   By: David  Martinique M.D.   On: 07/18/2017 08:02     ASSESSMENT / PLAN: 77 year old female with PMHx significant for fusiform ascending aortic aneurysm, hypertension, hyperlipidemia, status post AVR and replacement of ascending aortic aneurysm.  On 07/10/2017 postprocedure patient developed acute cardiogenic shock had an episode of V. fib arrest was taken back to the OR on 07/11/2017 for evacuation of hematoma and placement of RVAD.  RVAD now removed, weaning pressors.  Ready for extubation   ASSESSMENT / PLAN:  Cardiogenic shock s/p removal of RVAD 1/21; sternum now closed Atrial fibrillation per tele Remains on multiple pressors Plan Per cardiothoracics and heart failure team   Acute respiratory failure in setting of cardiogenic shock  Chest x-ray reviewed: tubes/lines in good position. Right sided atx.  Plan Extubate to [facility] sedating gtts PT/OT  Acute encephalopathy Plan Supportive care  Hypokalemia and hypomagnesemia  Plan Replace and recheck  anemia of critical illness Thrombocytopenia  Plan Trend cbc Transfuse per protocol   Hyperglycemia  Plan ssi   FAMILY  - Updates 07/16/2017 no family at bedside  - Inter-disciplinary family meet or Palliative Care meeting due by:  DAy 7. Current LOS is LOS 9 days  DISPO  DVT prophylaxis:  SUP: PPI  Diet: scd Activity: BR Disposition : ICU   Erick Colace ACNP-BC [place] Pager # [phone] OR # [phone] if no  answer  Attending Note:  77 year old female with extensive cardiac history who presents to PCCM in respiratory failure from fluid overload and pulmonary edema.  Patient was started on a lasix drip on 1/22 and diuresed very well overnight.  Patient is weaning very well this AM on exam and is able to tolerate 5/5 very well.  I reviewed CXR myself, pulmonary edema improving and ETT is in good position.  Will proceed with extubation today.  Diureses as ordered.  Replace electrolytes.  PCCM will f/u post extubation to ensure patient is doing well.  The patient is critically ill with multiple organ systems failure and requires high complexity decision making for assessment and support, frequent evaluation and titration of therapies, application of advanced monitoring technologies and extensive interpretation of multiple databases.   Critical Care Time devoted to patient care services described in this note is  35  Minutes. This time reflects time of care of this signee Dr Jennet Maduro. This critical care time does not reflect procedure time, or teaching time or supervisory time of PA/NP/Med student/Med Resident etc but could involve care discussion time.  Rush Farmer, M.D. [facility] Pulmonary/Critical Care Medicine. Pager: [phone]. After hours pager: [phone].

## 2017-07-19 NOTE — Progress Notes (Signed)
2 Days Post-Op Procedure(s) (LRB): REMOVAL OF RVAD WITH PUMP STANDBY (N/A) TRANSESOPHAGEAL ECHOCARDIOGRAM (TEE) (N/A) Subjective: More alert, cxr clear >5 L urine on lasix drip nsr Ready to extubate  Objective: Vital signs in last 24 hours: Temp:  [98.4 F (36.9 C)-100.1 F (37.8 C)] 98.6 F (37 C) (01/23 0700) Pulse Rate:  [85-104] 96 (01/23 0759) Cardiac Rhythm: Normal sinus rhythm (01/23 0400) Resp:  [7-57] 34 (01/23 0759) BP: (76-129)/(44-84) 120/53 (01/23 0759) SpO2:  [93 %-100 %] 98 % (01/23 0759) Arterial Line BP: (73-160)/(33-70) 122/60 (01/23 0700) FiO2 (%):  [40 %-50 %] 40 % (01/23 0759) Weight:  [205 lb 11 oz (93.3 kg)] 205 lb 11 oz (93.3 kg) (01/23 0500)  Hemodynamic parameters for last 24 hours: PAP: (24-29)/(11-15) 29/12 CVP:  [7 mmHg-16 mmHg] 7 mmHg  Intake/Output from previous day: 01/22 0701 - 01/23 0700 In: 3509.4 [I.V.:2145.4; ZH/YQ:6578; IV Piggyback:250] Out: 4696 [Urine:5730; Emesis/NG output:130; Chest Tube:250] Intake/Output this shift: No intake/output data recorded.       Exam    General- alert and comfortable on vent    Neck- no JVD, no cervical adenopathy palpable, no carotid bruit   Lungs- clear without rales, wheezes   Cor- regular rate and rhythm, no murmur , gallop   Abdomen- soft, non-tender   Extremities - warm, non-tender, minimal edema   Neuro- oriented, appropriate, no focal weakness   Lab Results: Recent Labs    07/18/17 1742 07/18/17 1801 07/19/17 0415  WBC 17.1*  --  15.4*  HGB 9.0* 8.8* 8.8*  HCT 26.6* 26.0* 26.1*  PLT 207  --  222   BMET:  Recent Labs    07/18/17 0359  07/18/17 1801 07/19/17 0415  NA 134*  --  137 137  K 4.1  --  3.7 3.3*  CL 101  --  97* 101  CO2 23  --   --  23  GLUCOSE 101*  --  127* 135*  BUN 13  --  15 15  CREATININE 0.59   < > 0.60 0.78  CALCIUM 7.4*  --   --  6.8*   < > = values in this interval not displayed.    PT/INR:  Recent Labs    07/17/17 1144  LABPROT 15.1  INR 1.20    ABG    Component Value Date/Time   PHART 7.400 07/18/2017 0404   HCO3 23.0 07/18/2017 0404   TCO2 25 07/18/2017 1801   ACIDBASEDEF 1.0 07/18/2017 0404   O2SAT 97.9 07/19/2017 0405   CBG (last 3)  Recent Labs    07/19/17 0000 07/19/17 0335 07/19/17 0801  GLUCAP 151* 140* 76    Assessment/Plan: S/P Procedure(s) (LRB): REMOVAL OF RVAD WITH PUMP STANDBY (N/A) TRANSESOPHAGEAL ECHOCARDIOGRAM (TEE) (N/A) Diuresis extubate Decrease lasix drip 5mg /hr  LOS: 9 days    [name] 07/19/2017

## 2017-07-19 NOTE — Progress Notes (Signed)
Patient blood sugars have been within normal range today. Tube feeds are not running and cortrak was pulled out. Patient is currently NPO. RN called [facility] and questioned Levemir ordered for tonight. Will hold and continue to monitor patient's blood sugar.   [name] E [name], [place]

## 2017-07-20 ENCOUNTER — Inpatient Hospital Stay (HOSPITAL_COMMUNITY): Payer: Medicare Other

## 2017-07-20 DIAGNOSIS — R5381 Other malaise: Secondary | ICD-10-CM

## 2017-07-20 LAB — BLOOD GAS, ARTERIAL
ACID-BASE EXCESS: 9.3 mmol/L — AB (ref 0.0–2.0)
Acid-Base Excess: 9.4 mmol/L — ABNORMAL HIGH (ref 0.0–2.0)
Bicarbonate: 32.7 mmol/L — ABNORMAL HIGH (ref 20.0–28.0)
Bicarbonate: 32.4 mmol/L — ABNORMAL HIGH (ref 20.0–28.0)
DRAWN BY: 441371
FIO2: 60
FIO2: 1
LHR: 12 {breaths}/min
MECHVT: 400 mL
O2 Content: 25 L/min
O2 SAT: 94.6 %
O2 Saturation: 89.5 %
PEEP/CPAP: 12 cmH2O
PH ART: 7.565 — AB (ref 7.350–7.450)
PO2 ART: 66.4 mmHg — AB (ref 83.0–108.0)
Patient temperature: 98.6
Patient temperature: 97.7
pCO2 arterial: 38.5 mmHg (ref 32.0–48.0)
pCO2 arterial: 35.5 mmHg (ref 32.0–48.0)
pH, Arterial: 7.539 — ABNORMAL HIGH (ref 7.350–7.450)
pO2, Arterial: 55.3 mmHg — ABNORMAL LOW (ref 83.0–108.0)

## 2017-07-20 LAB — CBC
HCT: 32.3 % — ABNORMAL LOW (ref 36.0–46.0)
Hemoglobin: 10.4 g/dL — ABNORMAL LOW (ref 12.0–15.0)
MCH: 29.4 pg (ref 26.0–34.0)
MCHC: 32.2 g/dL (ref 30.0–36.0)
MCV: 91.2 fL (ref 78.0–100.0)
Platelets: 308 10*3/uL (ref 150–400)
RBC: 3.54 MIL/uL — ABNORMAL LOW (ref 3.87–5.11)
RDW: 17 % — ABNORMAL HIGH (ref 11.5–15.5)
WBC: 13.8 10*3/uL — ABNORMAL HIGH (ref 4.0–10.5)

## 2017-07-20 LAB — COMPREHENSIVE METABOLIC PANEL
ALT: 37 U/L (ref 14–54)
AST: 52 U/L — ABNORMAL HIGH (ref 15–41)
Albumin: 2.7 g/dL — ABNORMAL LOW (ref 3.5–5.0)
Alkaline Phosphatase: 205 U/L — ABNORMAL HIGH (ref 38–126)
Anion gap: 17 — ABNORMAL HIGH (ref 5–15)
BUN: 18 mg/dL (ref 6–20)
CO2: 28 mmol/L (ref 22–32)
Calcium: 6.8 mg/dL — ABNORMAL LOW (ref 8.9–10.3)
Chloride: 93 mmol/L — ABNORMAL LOW (ref 101–111)
Creatinine, Ser: 0.74 mg/dL (ref 0.44–1.00)
GFR calc Af Amer: >60 mL/min (ref >60)
GFR calc non Af Amer: >60 mL/min (ref >60)
Glucose, Bld: 101 mg/dL — ABNORMAL HIGH (ref 65–99)
Potassium: 2.8 mmol/L — ABNORMAL LOW (ref 3.5–5.1)
Sodium: 138 mmol/L (ref 135–145)
Total Bilirubin: 2 mg/dL — ABNORMAL HIGH (ref 0.3–1.2)
Total Protein: 5.4 g/dL — ABNORMAL LOW (ref 6.5–8.1)

## 2017-07-20 LAB — GLUCOSE, CAPILLARY
Glucose-Capillary: 145 mg/dL — ABNORMAL HIGH (ref 65–99)
Glucose-Capillary: 77 mg/dL (ref 65–99)
Glucose-Capillary: 83 mg/dL (ref 65–99)
Glucose-Capillary: 84 mg/dL (ref 65–99)
Glucose-Capillary: 85 mg/dL (ref 65–99)
Glucose-Capillary: 88 mg/dL (ref 65–99)
Glucose-Capillary: 92 mg/dL (ref 65–99)

## 2017-07-20 LAB — BASIC METABOLIC PANEL
ANION GAP: 16 — AB (ref 5–15)
BUN: 21 mg/dL — ABNORMAL HIGH (ref 6–20)
CHLORIDE: 94 mmol/L — AB (ref 101–111)
CO2: 29 mmol/L (ref 22–32)
CREATININE: 0.86 mg/dL (ref 0.44–1.00)
Calcium: 6.5 mg/dL — ABNORMAL LOW (ref 8.9–10.3)
GFR calc Af Amer: >60 mL/min (ref >60)
GFR calc non Af Amer: >60 mL/min (ref >60)
Glucose, Bld: 85 mg/dL (ref 65–99)
Potassium: 3.3 mmol/L — ABNORMAL LOW (ref 3.5–5.1)
Sodium: 139 mmol/L (ref 135–145)

## 2017-07-20 LAB — POCT I-STAT 3, ART BLOOD GAS (G3+)
Acid-Base Excess: 12 mmol/L — ABNORMAL HIGH (ref 0.0–2.0)
Bicarbonate: 33.9 mmol/L — ABNORMAL HIGH (ref 20.0–28.0)
O2 Saturation: 94 %
Patient temperature: 97.7
TCO2: 35 mmol/L — ABNORMAL HIGH (ref 22–32)
pCO2 arterial: 33.9 mmHg (ref 32.0–48.0)
pH, Arterial: 7.606 (ref 7.350–7.450)
pO2, Arterial: 58 mmHg — ABNORMAL LOW (ref 83.0–108.0)

## 2017-07-20 LAB — TRIGLYCERIDES: Triglycerides: 134 mg/dL (ref <150)

## 2017-07-20 LAB — PHOSPHORUS
Phosphorus: 3.9 mg/dL (ref 2.5–4.6)
Phosphorus: 3.9 mg/dL (ref 2.5–4.6)

## 2017-07-20 LAB — MAGNESIUM
Magnesium: 1.8 mg/dL (ref 1.7–2.4)
Magnesium: 1.7 mg/dL (ref 1.7–2.4)
Magnesium: 1.6 mg/dL — ABNORMAL LOW (ref 1.7–2.4)

## 2017-07-20 LAB — COOXEMETRY PANEL
Carboxyhemoglobin: 1.6 % — ABNORMAL HIGH (ref 0.5–1.5)
Methemoglobin: 0.9 % (ref 0.0–1.5)
O2 Saturation: 52.3 %
Total hemoglobin: 12.2 g/dL (ref 12.0–16.0)

## 2017-07-20 MED ORDER — FENTANYL CITRATE (PF) 100 MCG/2ML IJ SOLN
INTRAMUSCULAR | Status: AC
  Administered 2017-07-20: 100 ug
  Filled 2017-07-20: qty 2

## 2017-07-20 MED ORDER — FENTANYL CITRATE (PF) 100 MCG/2ML IJ SOLN
50.0000 ug | INTRAMUSCULAR | Status: AC | PRN
  Administered 2017-07-20 (×3): 50 ug via INTRAVENOUS
  Filled 2017-07-20 (×2): qty 2

## 2017-07-20 MED ORDER — FENTANYL BOLUS VIA INFUSION
25.0000 ug | INTRAVENOUS | Status: DC | PRN
  Administered 2017-07-23: 25 ug via INTRAVENOUS
  Filled 2017-07-20: qty 25

## 2017-07-20 MED ORDER — FUROSEMIDE 10 MG/ML IJ SOLN
40.0000 mg | Freq: Two times a day (BID) | INTRAMUSCULAR | Status: DC
  Administered 2017-07-20 – 2017-07-21 (×3): 40 mg via INTRAVENOUS
  Filled 2017-07-20 (×3): qty 4

## 2017-07-20 MED ORDER — POTASSIUM CHLORIDE 10 MEQ/50ML IV SOLN
10.0000 meq | INTRAVENOUS | Status: AC | PRN
  Administered 2017-07-20 – 2017-07-21 (×3): 10 meq via INTRAVENOUS
  Filled 2017-07-20 (×3): qty 50

## 2017-07-20 MED ORDER — BUDESONIDE 0.5 MG/2ML IN SUSP
0.5000 mg | Freq: Two times a day (BID) | RESPIRATORY_TRACT | Status: DC
  Administered 2017-07-20 – 2017-08-12 (×47): 0.5 mg via RESPIRATORY_TRACT
  Filled 2017-07-20 (×47): qty 2

## 2017-07-20 MED ORDER — POTASSIUM CHLORIDE CRYS ER 20 MEQ PO TBCR
40.0000 meq | EXTENDED_RELEASE_TABLET | ORAL | Status: DC
  Filled 2017-07-20: qty 2

## 2017-07-20 MED ORDER — ETOMIDATE 2 MG/ML IV SOLN
0.3000 mg/kg | Freq: Once | INTRAVENOUS | Status: AC
  Administered 2017-07-20: 20 mg via INTRAVENOUS

## 2017-07-20 MED ORDER — VITAL AF 1.2 CAL PO LIQD
1000.0000 mL | ORAL | Status: DC
  Administered 2017-07-20 – 2017-07-24 (×4): 1000 mL

## 2017-07-20 MED ORDER — CHLORHEXIDINE GLUCONATE 0.12% ORAL RINSE (MEDLINE KIT)
15.0000 mL | Freq: Two times a day (BID) | OROMUCOSAL | Status: DC
  Administered 2017-07-21 – 2017-07-26 (×12): 15 mL via OROMUCOSAL

## 2017-07-20 MED ORDER — POTASSIUM CHLORIDE 20 MEQ/15ML (10%) PO SOLN: 40.0000 meq | Freq: Once | ORAL | Status: DC

## 2017-07-20 MED ORDER — FENTANYL CITRATE (PF) 100 MCG/2ML IJ SOLN: 50.0000 ug | INTRAMUSCULAR | Status: DC | PRN

## 2017-07-20 MED ORDER — FENTANYL CITRATE (PF) 100 MCG/2ML IJ SOLN
50.0000 ug | Freq: Once | INTRAMUSCULAR | Status: AC
  Administered 2017-07-20: 50 ug via INTRAVENOUS
  Filled 2017-07-20: qty 2

## 2017-07-20 MED ORDER — PROPOFOL 1000 MG/100ML IV EMUL
0.0000 ug/kg/min | INTRAVENOUS | Status: DC
  Administered 2017-07-20: 20 ug/kg/min via INTRAVENOUS
  Administered 2017-07-20: 50 ug/kg/min via INTRAVENOUS
  Administered 2017-07-20 (×2): 40 ug/kg/min via INTRAVENOUS
  Administered 2017-07-21: 50 ug/kg/min via INTRAVENOUS
  Administered 2017-07-21 – 2017-07-22 (×5): 40 ug/kg/min via INTRAVENOUS
  Administered 2017-07-22 (×2): 30 ug/kg/min via INTRAVENOUS
  Administered 2017-07-23: 25 ug/kg/min via INTRAVENOUS
  Filled 2017-07-20 (×7): qty 100
  Filled 2017-07-20: qty 200
  Filled 2017-07-20 (×6): qty 100

## 2017-07-20 MED ORDER — DEXMEDETOMIDINE HCL IN NACL 200 MCG/50ML IV SOLN
0.4000 ug/kg/h | INTRAVENOUS | Status: DC
  Administered 2017-07-20 (×3): 1.2 ug/kg/h via INTRAVENOUS
  Filled 2017-07-20 (×3): qty 50

## 2017-07-20 MED ORDER — MIDAZOLAM HCL 2 MG/2ML IJ SOLN
INTRAMUSCULAR | Status: AC
  Administered 2017-07-20: 2 mg
  Filled 2017-07-20: qty 4

## 2017-07-20 MED ORDER — ARFORMOTEROL TARTRATE 15 MCG/2ML IN NEBU
15.0000 ug | INHALATION_SOLUTION | Freq: Two times a day (BID) | RESPIRATORY_TRACT | Status: DC
  Administered 2017-07-20 – 2017-08-12 (×47): 15 ug via RESPIRATORY_TRACT
  Filled 2017-07-20 (×47): qty 2

## 2017-07-20 MED ORDER — FENTANYL 2500MCG IN NS 250ML (10MCG/ML) PREMIX INFUSION
25.0000 ug/h | INTRAVENOUS | Status: DC
  Administered 2017-07-20 (×2): 300 ug/h via INTRAVENOUS
  Administered 2017-07-20: 100 ug/h via INTRAVENOUS
  Administered 2017-07-21: 200 ug/h via INTRAVENOUS
  Administered 2017-07-22: 125 ug/h via INTRAVENOUS
  Administered 2017-07-22: 200 ug/h via INTRAVENOUS
  Administered 2017-07-23: 250 ug/h via INTRAVENOUS
  Administered 2017-07-23: 175 ug/h via INTRAVENOUS
  Administered 2017-07-23: 275 ug/h via INTRAVENOUS
  Administered 2017-07-24 – 2017-07-25 (×4): 400 ug/h via INTRAVENOUS
  Filled 2017-07-20 (×13): qty 250

## 2017-07-20 MED ORDER — QUETIAPINE FUMARATE 50 MG PO TABS
50.0000 mg | ORAL_TABLET | Freq: Every day | ORAL | Status: DC
Start: 2017-07-20 — End: 2017-07-22
  Administered 2017-07-20 – 2017-07-21 (×2): 50 mg
  Filled 2017-07-20 (×2): qty 1

## 2017-07-20 MED ORDER — MAGNESIUM SULFATE 2 GM/50ML IV SOLN
2.0000 g | Freq: Once | INTRAVENOUS | Status: AC
  Administered 2017-07-20: 2 g via INTRAVENOUS
  Filled 2017-07-20: qty 50

## 2017-07-20 MED ORDER — POTASSIUM CHLORIDE 10 MEQ/50ML IV SOLN: 10.0000 meq | INTRAVENOUS | Status: DC

## 2017-07-20 MED ORDER — ORAL CARE MOUTH RINSE
15.0000 mL | OROMUCOSAL | Status: DC
  Administered 2017-07-20 – 2017-07-26 (×62): 15 mL via OROMUCOSAL

## 2017-07-20 MED ORDER — POTASSIUM CHLORIDE 10 MEQ/50ML IV SOLN
10.0000 meq | INTRAVENOUS | Status: AC
  Administered 2017-07-20 (×6): 10 meq via INTRAVENOUS
  Filled 2017-07-20 (×7): qty 50

## 2017-07-20 MED ORDER — ROCURONIUM BROMIDE 50 MG/5ML IV SOLN
1.0000 mg/kg | Freq: Once | INTRAVENOUS | Status: AC
Start: 2017-07-20 — End: 2017-07-20
  Administered 2017-07-20: 88.6 mg via INTRAVENOUS
  Filled 2017-07-20: qty 8.86

## 2017-07-20 MED ORDER — PRO-STAT SUGAR FREE PO LIQD
30.0000 mL | Freq: Two times a day (BID) | ORAL | Status: DC
  Administered 2017-07-20 – 2017-07-26 (×11): 30 mL
  Filled 2017-07-20 (×11): qty 30

## 2017-07-20 MED ORDER — VITAL HIGH PROTEIN PO LIQD: 1000.0000 mL | ORAL | Status: DC

## 2017-07-20 NOTE — Progress Notes (Signed)
Nutrition Follow-up  INTERVENTION:   Vital AF 1.2 @ 45 ml/hr (1080 ml/day) 30 ml Prostat BID  Provides: 1496 kcal, 111 grams protein, and 875 ml free water.    NUTRITION DIAGNOSIS:   Inadequate oral intake related to inability to eat as evidenced by NPO status. Ongoing.   GOAL:   Patient will meet greater than or equal to 90% of their needs Progressing.   MONITOR:   Vent status, I & O's, TF tolerance  REASON FOR ASSESSMENT:   Consult Enteral/tube feeding initiation and management  ASSESSMENT:   Pt with PMH of HTN, HLD, B12 deficiency admitted 1/14 for planned AVR and replacement of ascending aortic aneurysm. Pt developed acute cardiogenic shock and VF arrest. OR 1/15 for evac of hematoma and placement of RVAD/centromag.   Spoke with RN at bedside. Per RN pt was re-intubated after being extubated yesterday due to increased WOB. Pt pulled out her Cortrak tube therefore has no enteral access. Plan for Cortrak tomorrow.  Per MD plan trach early next week.   CT: 270 ml x 24 hr 32.5 L positive - weight is up 27 lb UOP: 6550 ml   Patient is currently intubated on ventilator support MV: 8.2 L/min Temp (24hrs), Avg:98 F (36.7 C), Min:97.7 F (36.5 C), Max:98.9 F (37.2 C)   Medications reviewed and include: colace, dulcolax, lasix, SSI, levemir Labs reviewed: K+ 2.8 (L)   1/14 S/p AVR and ascending AA replacement  1/14 VF Arrest in evening with shock x 1 1/15 to OR for evacuation of hematoma and RVAD placement  1/21 RVAD removed, sternum closed  Diet Order:  Diet NPO time specified  EDUCATION NEEDS:   No education needs have been identified at this time  Skin:  Skin Assessment: Reviewed RN Assessment  Last BM:  unknown  Height:   Ht Readings from Last 1 Encounters:  07/20/17 5\' 5"  (1.651 m)    Weight:   Wt Readings from Last 1 Encounters:  07/20/17 195 lb 5.2 oz (88.6 kg)    Ideal Body Weight:  56.8 kg  BMI:  Body mass index is 32.5  kg/m.  Estimated Nutritional Needs:   Kcal:  1433  Protein:  110-120 grams  Fluid:  > 1.5 L/day  Maylon Peppers RD, LDN, CNSC [phone] Pager [phone] After Hours Pager

## 2017-07-20 NOTE — Procedures (Signed)
Intubation Procedure Note [name] [id number] 1940/07/09  Procedure: Intubation Indications: Airway protection and maintenance  Procedure Details Consent: Risks of procedure as well as the alternatives and risks of each were explained to the (patient/caregiver).  Consent for procedure obtained. Time Out: Verified patient identification, verified procedure, site/side was marked, verified correct patient position, special equipment/implants available, medications/allergies/relevent history reviewed, required imaging and test results available.  Performed  Maximum sterile technique was used including antiseptics, gloves, hand hygiene and mask.  3 Glide scope  7.5 ett  Excellent view    Evaluation Hemodynamic Status: BP stable throughout; O2 sats: stable throughout Patient's Current Condition: stable Complications: No apparent complications Patient did tolerate procedure well. Chest X-ray ordered to verify placement.  CXR: pending.   Clementeen Graham 07/20/2017  Erick Colace ACNP-BC [place] Pager # [phone] OR # [phone] if no answer  Rush Farmer, M.D. [facility] Pulmonary/Critical Care Medicine. Pager: [phone]. After hours pager: [phone]

## 2017-07-20 NOTE — Procedures (Signed)
Intubation Procedure Note WYNTER ISAACS [id number] 01-Sep-1940  Procedure: Intubation Indications: Respiratory insufficiency  Procedure Details Consent: Risks of procedure as well as the alternatives and risks of each were explained to the (patient/caregiver).  Consent for procedure obtained. Time Out: Verified patient identification, verified procedure, site/side was marked, verified correct patient position, special equipment/implants available, medications/allergies/relevent history reviewed, required imaging and test results available.  Performed  Maximum sterile technique was used including gloves, hand hygiene and mask.  MAC    Evaluation Hemodynamic Status: BP stable throughout; O2 sats: stable throughout Patient's Current Condition: stable Complications: No apparent complications Patient did tolerate procedure well. Chest X-ray ordered to verify placement.  CXR: pending.   Jennet Maduro 07/20/2017

## 2017-07-20 NOTE — Progress Notes (Signed)
.. ..  Name: [name] MRN: [id number] DOB: March 26, 1941    ADMISSION DATE:  07/10/2017  CONSULTATION DATE:  07/12/17  REFERRING MD :  Nils Pyle MD  CHIEF COMPLAINT:   BRIEF PATIENT DESCRIPTION: SIGNIFICANT EVENTS  77 yr old female POD 1 TEE, Open Heart with evacuation of Hematoma and placement of RVAD.  STUDIES: Echo12/12/2016 LVEF 55-60%, Grade 1 DD, Mild/Mod AI with LVOT, Mid ascending aortic diameter 48.88 mm, Mild LAE, RV normal    BRIEF:   77 year old female with past medical history significant for fusiform ascending aortic aneurysm, hypertension, hyperlipidemia, status post AVR and replacement of ascending aortic aneurysm.  On 07/10/2017 postprocedure patient developed acute cardiogenic shock had an episode of V. fib arrest was taken back to the OR on 07/11/2017 for evacuation of hematoma and placement of RVAD.  PCCM consulted for ventilator management.   Events - 07/10/17 Admit - 07/10/17 S/p AVR and ascending AA replacement  - 07/10/17 VF Arrest in evening with shock x 1 - 07/11/17 to OR for evacuation of hematoma and RVAD placement  - 07/12/17 PCCM consult 07/15/2017 Lasix per cardiology CVP of 5 1/22 heavily diuresed  1/23 ready for extubation  SUBJECTIVE/OVERNIGHT/INTERVAL HX Multiple concerns: in and out of AF, rising FIO2 needs, paradoxical respiratory efforts. Very ineffective cough   VITAL SIGNS: Temp:  [97 F (36.1 C)-98.9 F (37.2 C)] 97.7 F (36.5 C) (01/24 0803) Pulse Rate:  [37-151] 97 (01/24 0803) Resp:  [16-54] 27 (01/24 0803) BP: (95-129)/(48-99) 118/65 (01/24 0803) SpO2:  [80 %-98 %] 94 % (01/24 0808) Arterial Line BP: (103-165)/(45-69) 141/52 (01/24 0800) FiO2 (%):  [50 %-60 %] 60 % (01/24 0808) Weight:  [195 lb 5.2 oz (88.6 kg)] 195 lb 5.2 oz (88.6 kg) (01/24 0500)  PHYSICAL EXAMINATION: General: This is a 77 year old female currently with increased respiratory effort, poor cough, paradoxical breathing. HEENT, mucous membranes moist, no JVD,  poor phonation Pulmonary: Marked accessory use, paradoxical effort, diffuse rhonchi and rales Cardiac: Currently sinus rhythm by in and out atrial fibrillation sternal dressing intact Abdomen: Soft nontender no organomegaly Extremities: Still with trace edema warm, dry, strong pulses Neuro: Awake alert less confused generalized weakness  PULMONARY Recent Labs  Lab 07/18/17 0404  07/18/17 1801  07/19/17 0828 07/19/17 1313 07/19/17 1425 07/19/17 1849 07/20/17 0515 07/20/17 0520  PHART 7.400  --   --   --  7.486* 7.476*  --  7.456*  --  7.539*  PCO2ART 37.2  --   --   --  35.1 40.6  --  42.4  --  38.5  PO2ART 64.0*  --   --   --  74.0* 67.0*  --  79.0*  --  55.3*  HCO3 23.0  --   --   --  26.5 29.9*  --  29.9*  --  32.7*  TCO2 24  --  25  --  28 31 29 31   --   --   O2SAT 92.0   < >  --    < > 96.0 94.0  --  96.0 52.3 89.5   < > = values in this interval not displayed.    CBC Recent Labs  Lab 07/19/17 0415 07/19/17 1418 07/19/17 1425 07/20/17 0500  HGB 8.8* 10.2* 10.2* 10.4*  HCT 26.1* 30.9* 30.0* 32.3*  WBC 15.4* 19.9*  --  13.8*  PLT 222 286  --  308    COAGULATION Recent Labs  Lab 07/17/17 1144  INR 1.20    CARDIAC  No results for input(s): TROPONINI in the last 168 hours. No results for input(s): PROBNP in the last 168 hours.   CHEMISTRY Recent Labs  Lab 07/14/17 0217  07/15/17 0231  07/16/17 0233  07/17/17 0346  07/17/17 1709  07/18/17 0359 07/18/17 1742 07/18/17 1801 07/19/17 0415 07/19/17 1418 07/19/17 1425 07/20/17 0500  NA 135   < > 134*   < > 134*   < > 138   < >  --    < > 134*  --  137 137 139 138 138  K 3.8   < > 3.7   < > 4.2   < > 4.2   < >  --    < > 4.1  --  3.7 3.3* 3.3* 3.3* 2.8*  CL 96*   < > 95*   < > 98*   < > 100*   < >  --    < > 101  --  97* 101 98* 96* 93*  CO2 28  --  29  --  26  --  28  --   --   --  23  --   --  23 26  --  28  GLUCOSE 178*   < > 108*   < > 175*   < > 126*   < >  --    < > 101*  --  127* 135* 141* 142*  101*  BUN 14   < > 12   < > 10   < > 8   < >  --    < > 13  --  15 15 15 14 18   CREATININE 0.75   < > 0.62   < > 0.58   < > 0.57   < > 0.55   < > 0.59 0.69 0.60 0.78 0.72 0.60 0.74  CALCIUM 7.8*  --  8.9  --  8.7*  --  8.0*  --   --   --  7.4*  --   --  6.8* 6.7*  --  6.8*  MG 1.5*  --  1.5*  --  1.5*  --  1.3*  --  2.6*  --  2.0 1.7  --  1.4*  --   --  1.8  PHOS 4.1  --  4.5  --  5.6*  --  3.8  --   --   --   --   --   --   --   --   --   --    < > = values in this interval not displayed.   Estimated Creatinine Clearance: 66.8 mL/min (by C-G formula based on SCr of 0.74 mg/dL).   LIVER Recent Labs  Lab 07/14/17 0217 07/15/17 0231 07/17/17 1144 07/19/17 1418 07/20/17 0500  AST 49* 34  --  51* 52*  ALT 32 25  --  35 37  ALKPHOS 61 73  --  172* 205*  BILITOT 1.0 1.1  --  1.3* 2.0*  PROT 4.7* 4.4*  --  5.2* 5.4*  ALBUMIN 2.6* 2.3*  --  2.7* 2.7*  INR  --   --  1.20  --   --     INFECTIOUS Recent Labs  Lab 07/13/17 2331 07/15/17 0231 07/16/17 0233  LATICACIDVEN 1.6  --   --   PROCALCITON  --  0.67 0.50    ENDOCRINE CBG (last 3)  Recent Labs    07/20/17 0002 07/20/17 0334 07/20/17 0805  GLUCAP 83  85 84     IMAGING x48h  - image(s) personally visualized  -   highlighted in bold Dg Chest Port 1 View  Result Date: 07/20/2017 CLINICAL DATA:  Chest tube EXAM: PORTABLE CHEST 1 VIEW COMPARISON:  Yesterday FINDINGS: Interval tracheal extubation. A midline thoracic drain has been removed. There are bilateral central lines with tips at the upper and lower SVC. Bilateral chest tubes. Probable trace right apical pneumothorax. Cardiomegaly. Status post aortic valve replacement. Stable inflation. Bilateral atelectasis. IMPRESSION: 1. Probable trace right apical pneumothorax. 2. Atelectasis with stable inflation after extubation. Electronically Signed   By: Monte Fantasia M.D.   On: 07/20/2017 08:07   Dg Chest Port 1 View  Result Date: 07/19/2017 CLINICAL DATA:  Status post  removal of right ventricular assist device EXAM: PORTABLE CHEST 1 VIEW COMPARISON:  07/18/2017 FINDINGS: The ET tube tip is above the carina. Bilateral chest tubes and mediastinal drain remain in place. Interval removal of right IJ catheter. A right arm PICC line is noted with tip at the cavoatrial junction. No pleural effusion or edema. Subsegmental atelectasis in the right upper lobe and left base appear unchanged. IMPRESSION: 1. Right upper lobe and left base atelectasis. 2. Bilateral chest tubes in place without evidence for pneumothorax. Electronically Signed   By: Kerby Moors M.D.   On: 07/19/2017 08:49     ASSESSMENT / PLAN: 77 year old female with PMHx significant for fusiform ascending aortic aneurysm, hypertension, hyperlipidemia, status post AVR and replacement of ascending aortic aneurysm.  On 07/10/2017 postprocedure patient developed acute cardiogenic shock had an episode of V. fib arrest was taken back to the OR on 07/11/2017 for evacuation of hematoma and placement of RVAD.  RVAD now removed, weaning pressors.  Failing extubations w/ multiple indicators: in and out of AF, poor cough, more hypoxic  Spoke w/ CVTS will re-intubate.   I think best course. Re-intubate, early trach, nocturnal ventilation until stronger.    ASSESSMENT / PLAN:   Cardiogenic shock s/p removal of RVAD 1/21; sternum now closed Atrial fibrillation per tele Pressors off Still on milrinone and amio -->may require pressors after intubation  Plan Per HF and CVTS  Acute respiratory failure initially in setting of cardiogenic shock, as of 1/24 mechanism seems to be atelectasis, poor cough mechanics, deconditioning +/- HF Chest x-ray reviewed: tubes/lines good position. Low volume film. Not much edema c/w prior exam. Small apical PTX  Plan   Acute encephalopathy Plan Supportive  Care  Hypokalemia and hypomagnesemia -->exacerbated by diuresis  Plan Replace and recheck  anemia of critical  illness Thrombocytopenia  Plan Trend cbc Transfuse per protocol   Hyperglycemia  Plan ssi   FAMILY  - Updates 07/16/2017 no family at bedside  - Inter-disciplinary family meet or Palliative Care meeting due by:  DAy 7. Current LOS is LOS 10 days  DISPO  DVT prophylaxis:  SUP: PPI  Diet: scd Activity: BR Disposition : ICU   Erick Colace ACNP-BC [place] Pager # [phone] OR # [phone] if no answer  Attending Note:  77 year old female who was extubated on Monday that is now developing respiratory failure again and unable to maintain her airway on exam.  I reviewed CXR myself, pulmonary edema noted.  Discussed with PCCM-NP and CVTS.  Will reintubate.  Will adjust vent for ABG.  Active diureses as able.  Will plan on trach early next week.  The patient is critically ill with multiple organ systems failure and requires high complexity decision making  for assessment and support, frequent evaluation and titration of therapies, application of advanced monitoring technologies and extensive interpretation of multiple databases.   Critical Care Time devoted to patient care services described in this note is  35  Minutes. This time reflects time of care of this signee Dr Jennet Maduro. This critical care time does not reflect procedure time, or teaching time or supervisory time of PA/NP/Med student/Med Resident etc but could involve care discussion time.  Rush Farmer, M.D. [facility] Pulmonary/Critical Care Medicine. Pager: [phone]. After hours pager: [phone].

## 2017-07-20 NOTE — Progress Notes (Signed)
[facility] Progress Note Patient Name: [name] DOB: 12-11-1940 MRN: [id number]   Date of Service  07/20/2017  HPI/Events of Note  K+ 2.8  eICU Interventions  6 runs KCl Hold furosemide gtt for now Recheck BMET @ 1400     Intervention Category Major Interventions: Other:  Wilhelmina Mcardle 07/20/2017, 6:35 AM

## 2017-07-20 NOTE — Progress Notes (Signed)
[facility] BRIEF SICU PROGRESS NOTE  3 Days Post-Op  S/P Procedure(s) (LRB): REMOVAL OF RVAD WITH PUMP STANDBY (N/A) TRANSESOPHAGEAL ECHOCARDIOGRAM (TEE) (N/A)   reintubated earlier today NSR - sinus tach BP stable on low dose levophed O2 sats 100% Excellent UOP Labs okay except potassium 3.3 w/ metabolic alkalosis  Plan: Replace potassium.  Otherwise continue current plan  Rexene Alberts, MD 07/20/2017 6:07 PM

## 2017-07-20 NOTE — Progress Notes (Signed)
Inpatient Rehabilitation  Please see consult by Dr. Naaman Plummer for full details; patient would need to be off vent in order to be candidate for IP Rehab.  Plan to follow along at a distance for medical stability.  Call if questions.   Carmelia Roller., CCC/SLP Admission Coordinator  [place]  Cell [phone]

## 2017-07-20 NOTE — Progress Notes (Signed)
Critical ABG values given to P. Kary Kos, NP.  Vent charges ordered.  VT 7 cc's, Peep 12, Rate 12 to be followed with an ABG.

## 2017-07-20 NOTE — Progress Notes (Signed)
Patient is currently maxed out on Precedex and Fentanyl and patient is still pulling at tubes and unable to be redirected by nursing staff. Called [place] with CCM and gave orders to start Propofol. Family at bedside and aware of patient condition.  Will continue to monitor.

## 2017-07-20 NOTE — Consult Note (Signed)
Physical Medicine and Rehabilitation Consult Reason for Consult: Decreased functional mobility Referring Physician: Dr. Gwenyth Bender   HPI: [name] is a 77 y.o. right handed female who is been followed by cardiothoracic surgery for a fusiform ascending aneurysm which has increased in size over the past 1-2 years with associated moderate aortic insufficiency. Per chart review patient lives with spouse. Independent with assistive device using a cane prior to admission. One level home 4 steps to entry. Spouse can assist as needed. Presented 07/09/2017 due to increasing size of a ascending aneurysm and underwent repair of ascending aneurysm as well as aortic valve replacement 07/10/2017 per Dr. Nils Pyle. Postoperative ventilatory support. Findings a small amount of anterior mediastinal hematoma as well as orthostasis requiring pressor support. Her urine output decreased concern for tamponade and return back to the operating room for reexploration to remove any blood/hematoma 07/11/2017 as well as findings RV function significantly decreased from earlier postoperative status and underwent implantation of percutaneous right ventricular assist device. Hospital course acute blood loss anemia. Bouts of atrial fibrillation followed by cardiology services. Currently maintained on subcutaneous Lovenox for DVT prophylaxis. Bouts of hypokalemia supplement added. Await formal swallow study. She was extubated 07/19/2017. Physical as well as occupational therapy evaluations completed 07/19/2017. Request made for physical medicine rehabilitation consult   Review of Systems  Constitutional: Negative for chills and fever.  HENT: Negative for hearing loss.   Eyes: Negative for blurred vision and double vision.  Respiratory: Positive for shortness of breath. Negative for cough.   Cardiovascular: Positive for palpitations and leg swelling.  Gastrointestinal: Positive for constipation. Negative for nausea and  vomiting.       GERD  Genitourinary: Negative for dysuria, flank pain and hematuria.  Musculoskeletal: Positive for joint pain and myalgias.  Skin: Negative for rash.  Neurological: Positive for tremors.  Psychiatric/Behavioral: Positive for depression. The patient has insomnia.        Anxiety  All other systems reviewed and are negative.  Past Medical History:  Diagnosis Date  . ACL tear    right  Dr. Gladstone Pih   . Adenomatous polyp   . Anxiety   . Aorta aneurysm ([place])   . Arthritis   . Ascending aortic aneurysm ([place])    note per chart per Dr Lucianne Lei Tright 4.7 cm 04/15/2015   . B12 deficiency   . Cancer ([place])   . Cataracts, both eyes 10/2006  . Chronic bronchitis ([place])   . Constipation   . Depression   . DUB (dysfunctional uterine bleeding) 10/96  . ETD (eustachian tube dysfunction)   . Fall   . Fibromyalgia   . GERD (gastroesophageal reflux disease)   . Glaucoma    ([place]) Dr. Arnoldo Morale   . Glaucoma    bilaterally  . Helicobacter pylori (H. pylori)   . Hiatal hernia   . History of bronchitis   . History of frequent urinary tract infections   . History of kidney stones   . History of left shoulder fracture    pt states fell off bed and broke ball in shoulder had rod placed   . Hyperlipidemia   . Hypertension   . Hypothyroidism   . Insomnia   . Leg wound, left    healed   . MVP (mitral valve prolapse)   . Occasional tremors    left arm   . Osteoarthritis   . Osteopenia   . Osteoporosis   . Other and unspecified hyperlipidemia   .  Post-menopausal   . Thyroid cancer ([place])   . Thyroid nodule   . Tremor    Past Surgical History:  Procedure Laterality Date  . AORTIC VALVE REPLACEMENT N/A 07/10/2017   Procedure: AORTIC VALVE REPLACEMENT (AVR);  Surgeon: [name], Collier Salina, MD;  Location: [facility];  Service: Open Heart Surgery;  Laterality: N/A;  . APPENDECTOMY    . BUNIONECTOMY  08/1999   right - Dr. Irving Shows   . CHOLECYSTECTOMY  1979  . DILATION AND CURETTAGE OF UTERUS   03/31/95   Dr. Ovid Curd   . EXPLORATION POST OPERATIVE OPEN HEART N/A 07/11/2017   Procedure: EXPLORATION POST OPERATIVE OPEN HEART;  Surgeon: Ivin Poot, MD;  Location: [facility];  Service: Open Heart Surgery;  Laterality: N/A;  . EYE SURGERY     also had left cataract removed   . FRACTURE SURGERY    . HEMATOMA EVACUATION N/A 07/11/2017   Procedure: EVACUATION HEMATOMA;  Surgeon: Ivin Poot, MD;  Location: [facility];  Service: Open Heart Surgery;  Laterality: N/A;  . HERNIA REPAIR    . NEPHROLITHOTOMY Left 04/07/2017   Procedure: NEPHROLITHOTOMY PERCUTANEOUS WITH SURGEON ACCESS;  Surgeon: Ardis Hughs, MD;  [facility];  Service: Urology;  Laterality: Left;  . ORIF HUMERUS FRACTURE  05/30/2011   Procedure: OPEN REDUCTION INTERNAL FIXATION (ORIF) PROXIMAL HUMERUS FRACTURE;  Surgeon: Augustin Schooling;  Location: [facility];  Service: Orthopedics;  Laterality: Left;  open reduction internal fixation of proximal humerus fracture  . PLACEMENT OF CENTRIMAG VENTRICULAR ASSIST DEVICE Right 07/11/2017   Procedure: PLACEMENT OF CENTRIMAG VENTRICULAR ASSIST DEVICE;  Surgeon: Ivin Poot, MD;  Location: [facility];  Service: Open Heart Surgery;  Laterality: Right;  . REMOVAL OF CENTRIMAG VENTRICULAR ASSIST DEVICE N/A 07/17/2017   Procedure: REMOVAL OF RVAD WITH PUMP STANDBY;  Surgeon: Ivin Poot, MD;  Location: [facility];  Service: Open Heart Surgery;  Laterality: N/A;  . REPLACEMENT ASCENDING AORTA N/A 07/10/2017   Procedure: REPLACEMENT ASCENDING AORTA;  Surgeon: Ivin Poot, MD;  Location: [facility];  Service: Open Heart Surgery;  Laterality: N/A;  . right knee replacement  3/11   Dr. Gladstone Pih  . RIGHT/LEFT HEART CATH AND CORONARY ANGIOGRAPHY N/A 06/13/2017   Procedure: RIGHT/LEFT HEART CATH AND CORONARY ANGIOGRAPHY;  Surgeon: Jolaine Artist, MD;  Location: [facility];  Service: Cardiovascular;  Laterality: N/A;  . rt. eye cataract  05/28/07  . TEE WITHOUT CARDIOVERSION N/A  07/10/2017   Procedure: TRANSESOPHAGEAL ECHOCARDIOGRAM (TEE);  Surgeon: [name], Collier Salina, MD;  Location: [facility];  Service: Open Heart Surgery;  Laterality: N/A;  . TEE WITHOUT CARDIOVERSION  07/11/2017   Procedure: TRANSESOPHAGEAL ECHOCARDIOGRAM (TEE);  Surgeon: [name], Collier Salina, MD;  Location: [facility];  Service: Open Heart Surgery;;  . TEE WITHOUT CARDIOVERSION N/A 07/17/2017   Procedure: TRANSESOPHAGEAL ECHOCARDIOGRAM (TEE);  Surgeon: [name], Collier Salina, MD;  Location: [facility];  Service: Open Heart Surgery;  Laterality: N/A;  . THYROID LOBECTOMY Left 07/27/2015   Procedure: LEFT THYROID LOBECTOMY;  Surgeon: Fanny Skates, MD;  [facility];  Service: General;  Laterality: Left;  . THYROIDECTOMY N/A 08/06/2015   Procedure: RIGHT THYROID LOBECTOMY, REIMPLANTATION PARATHYROID;  Surgeon: Fanny Skates, MD;  [facility];  Service: General;  Laterality: N/A;  . VENTRAL HERNIA REPAIR  2/99   Family History  Problem Relation Age of Onset  . Cancer Mother        PANCREATIC   . Osteoporosis Mother   . Hip  fracture Mother   . Heart attack Father 63       Died 68  . Heart disease Father   . Arthritis Father   . Hypertension Sister   . Cancer Sister        skin  . Hypertension Sister   . Breast cancer Neg Hx    Social History:  reports that  has never smoked. she has never used smokeless tobacco. She reports that she does not drink alcohol or use drugs. Allergies:  Allergies  Allergen Reactions  . Alendronate Sodium Nausea Only  . Levofloxacin Nausea And Vomiting  . Lipitor [Atorvastatin Calcium] Other (See Comments)    myalgias  . Lyrica [Pregabalin] Other (See Comments)    SORES IN MOUTH   . Meloxicam Other (See Comments)    Vaginal itching   . Sibutramine Hcl Monohydrate Other (See Comments)    Reaction unknown  . Actonel [Risedronate] Other (See Comments)    Bone pain and nausea   . Latex Rash    RA to latex 1982; includes bandaids     Facility-Administered Medications Prior  to Admission  Medication Dose Route Frequency Provider Last Rate Last Dose  . cyanocobalamin ((VITAMIN B-12)) injection 1,000 mcg  1,000 mcg Intramuscular Q30 days Chipper Herb, MD   1,000 mcg at 06/07/17 1004   Medications Prior to Admission  Medication Sig Dispense Refill  . aspirin 81 MG tablet Take 1 tablet (81 mg total) by mouth every other day. (Patient taking differently: Take 81 mg by mouth at bedtime. ) 30 tablet   . atenolol (TENORMIN) 50 MG tablet TAKE 1 TABLET DAILY (Patient taking differently: TAKE 1 TABLET BY MOUTH DAILY) 30 tablet 5  . gabapentin (NEURONTIN) 100 MG capsule Take 100 mg by mouth at bedtime.    Marland Kitchen levothyroxine (SYNTHROID, LEVOTHROID) 100 MCG tablet Take 100 mcg by mouth every other day. Alternates and takes 100 mcg one day and 112 mcg the next    . levothyroxine (SYNTHROID, LEVOTHROID) 112 MCG tablet Take 112 mcg by mouth daily before breakfast. Alternates and takes 100 mcg one day and 112 mcg the next  4  . Omega 3 1200 MG CAPS Take 1,200 mg by mouth at bedtime.    . polyethylene glycol (MIRALAX / GLYCOLAX) packet Take 17 g by mouth daily.     Marland Kitchen PROCTOZONE-HC 2.5 % rectal cream APPLY TO RECTALLY 2 TIMES A DAY AS NEEDED (Patient taking differently: APPLY TO RECTALLY 1 TIMES A DAY AT BEDTIME) 30 g 1  . Simethicone (GAS-X PO) Take 1 tablet by mouth daily as needed (GAS).    Marland Kitchen simvastatin (ZOCOR) 80 MG tablet TAKE ONE TABLET AT BEDTIME (Patient taking differently: TAKE ONE HALF (40 MG) TABLET BY MOUTH DAILY) 30 tablet 5  . TRAVATAN Z 0.004 % SOLN ophthalmic solution INSTILL 1 DROP INTO RIGHT EYE AT BEDTIME (Patient taking differently: INSTILL 1 DROP INTO BOTH EYES AT BEDTIME) 2.5 mL 2  . venlafaxine XR (EFFEXOR-XR) 75 MG 24 hr capsule TAKE (1) CAPSULE DAILY 90 capsule 0  . zolpidem (AMBIEN) 10 MG tablet TAKE 1/2 TABLET AT BEDTIME AS NEEDED (Patient taking differently: TAKE 0.5 TABLET (5 MG) BY MOUTH AT BEDTIME.) 45 tablet 2  . celecoxib (CELEBREX) 200 MG capsule TAKE  (1) CAPSULE DAILY (Patient not taking: Reported on 06/28/2017) 30 capsule 0  . clobetasol cream (TEMOVATE) 6.29 % Apply 1 application topically 2 (two) times daily. (Patient not taking: Reported on 06/28/2017) 30 g 2  . clorazepate (  TRANXENE) 7.5 MG tablet TAKE 1 TABLET DAILY AS NEEDED (Patient taking differently: TAKE 1 TABLET DAILY AS NEEDED FOR PAIN) 30 tablet 2  . docusate sodium (COLACE) 100 MG capsule Take 2 capsules (200 mg total) by mouth 2 (two) times daily. (Patient not taking: Reported on 06/28/2017) 120 capsule 11  . famotidine (PEPCID) 20 MG tablet Take 1 tablet (20 mg total) by mouth 2 (two) times daily. (Patient not taking: Reported on 06/28/2017) 60 tablet 1  . fluticasone (FLONASE) 50 MCG/ACT nasal spray Place 2 sprays into both nostrils daily. (Patient not taking: Reported on 06/28/2017) 16 g 6  . mupirocin ointment (BACTROBAN) 2 % Apply 1 application topically 2 (two) times daily. (Patient not taking: Reported on 06/28/2017) 22 g 1  . traMADol (ULTRAM) 50 MG tablet Take 1-2 tablets (50-100 mg total) by mouth every 6 (six) hours as needed for moderate pain. (Patient not taking: Reported on 06/28/2017) 20 tablet 0  . triamcinolone cream (KENALOG) 0.1 % Apply 1 application topically 3 (three) times daily. Avoid face and genitalia (Patient not taking: Reported on 06/28/2017) 28.4 g 0    Home: Home Living Family/patient expects to be discharged to:: Private residence Living Arrangements: Spouse/significant other Available Help at Discharge: Family, Available 24 hours/day Type of Home: Mobile home Home Access: Stairs to enter [employer] of Steps: 4 Entrance Stairs-Rails: Right, Left, Can reach both Home Layout: One level Bathroom Shower/Tub: Multimedia programmer: Handicapped height Home Equipment: [place] - single point Additional Comments: ? may have 02 in home  Functional History: Prior Function Level of Independence: Independent with assistive device(s) Comments: walked  with cane, sponge bathed and washed hair over sink, worked together with husband on housekeeping and cooking, drives rarely Functional Status:  Mobility: Bed Mobility Overal bed mobility: Needs Assistance Bed Mobility: Rolling, Sidelying to Sit, Sit to Sidelying Rolling: Total assist, +2 for physical assistance Sidelying to sit: Total assist, +2 for physical assistance Sit to sidelying: Total assist, +2 for physical assistance General bed mobility comments: instructed in log roll method to minimize abdominal pain Transfers Overall transfer level: Needs assistance Equipment used: 2 person hand held assist Transfers: Sit to/from Stand Sit to Stand: Max assist, +2 physical assistance General transfer comment: pt able to perform brief, partial stand from EOB with assist to rise and steady, cues for hands on knees      ADL: ADL General ADL Comments: Pt currently requiring total assist.  Cognition: Cognition Overall Cognitive Status: Within Functional Limits for tasks assessed Orientation Level: Oriented to person, Oriented to time, Disoriented to place, Oriented to situation Cognition Arousal/Alertness: Awake/alert Behavior During Therapy: WFL for tasks assessed/performed Overall Cognitive Status: Within Functional Limits for tasks assessed General Comments: somewhat difficult to assess cognition due to low volume  Blood pressure 118/67, pulse 95, temperature 97.7 F (36.5 C), temperature source Oral, resp. rate (!) 32, height _0  (1.651 m), weight 88.6 kg (195 lb 5.2 oz), SpO2 92 %. Physical Exam  Vitals reviewed. Constitutional: She appears well-developed.  HENT:  Head: Normocephalic.  Eyes: EOM are normal.  Neck: Normal range of motion. Neck supple. No thyromegaly present.  Cardiovascular:  Cardiac rate controlled  Respiratory:  Intubated  GI: Soft. Bowel sounds are normal. She exhibits no distension.  Neurological: She is alert.  Patient sedated and does not respond  to verbal commands.  General response to tactile stimulus  Skin:  Midline chest incision clean and dry    Results for orders placed or performed during  the hospital encounter of 07/10/17 (from the past 24 hour(s))  Glucose, capillary     Status: None   Collection Time: 07/19/17  8:01 AM  Result Value Ref Range   Glucose-Capillary 76 65 - 99 mg/dL   Comment 1 Capillary Specimen   I-STAT 3, arterial blood gas (G3+)     Status: Abnormal   Collection Time: 07/19/17  8:28 AM  Result Value Ref Range   pH, Arterial 7.486 (H) 7.350 - 7.450   pCO2 arterial 35.1 32.0 - 48.0 mmHg   pO2, Arterial 74.0 (L) 83.0 - 108.0 mmHg   Bicarbonate 26.5 20.0 - 28.0 mmol/L   TCO2 28 22 - 32 mmol/L   O2 Saturation 96.0 %   Acid-Base Excess 3.0 (H) 0.0 - 2.0 mmol/L   Patient temperature 98.7 F    Sample type ARTERIAL   Glucose, capillary     Status: None   Collection Time: 07/19/17 12:02 PM  Result Value Ref Range   Glucose-Capillary 88 65 - 99 mg/dL   Comment 1 Capillary Specimen   I-STAT 3, arterial blood gas (G3+)     Status: Abnormal   Collection Time: 07/19/17  1:13 PM  Result Value Ref Range   pH, Arterial 7.476 (H) 7.350 - 7.450   pCO2 arterial 40.6 32.0 - 48.0 mmHg   pO2, Arterial 67.0 (L) 83.0 - 108.0 mmHg   Bicarbonate 29.9 (H) 20.0 - 28.0 mmol/L   TCO2 31 22 - 32 mmol/L   O2 Saturation 94.0 %   Acid-Base Excess 6.0 (H) 0.0 - 2.0 mmol/L   Patient temperature 98.7 F    Sample type ARTERIAL   CBC     Status: Abnormal   Collection Time: 07/19/17  2:18 PM  Result Value Ref Range   WBC 19.9 (H) 4.0 - 10.5 K/uL   RBC 3.40 (L) 3.87 - 5.11 MIL/uL   Hemoglobin 10.2 (L) 12.0 - 15.0 g/dL   HCT 30.9 (L) 36.0 - 46.0 %   MCV 90.9 78.0 - 100.0 fL   MCH 30.0 26.0 - 34.0 pg   MCHC 33.0 30.0 - 36.0 g/dL   RDW 16.7 (H) 11.5 - 15.5 %   Platelets 286 150 - 400 K/uL  Comprehensive metabolic panel     Status: Abnormal   Collection Time: 07/19/17  2:18 PM  Result Value Ref Range   Sodium 139 135 - 145  mmol/L   Potassium 3.3 (L) 3.5 - 5.1 mmol/L   Chloride 98 (L) 101 - 111 mmol/L   CO2 26 22 - 32 mmol/L   Glucose, Bld 141 (H) 65 - 99 mg/dL   BUN 15 6 - 20 mg/dL   Creatinine, Ser 0.72 0.44 - 1.00 mg/dL   Calcium 6.7 (L) 8.9 - 10.3 mg/dL   Total Protein 5.2 (L) 6.5 - 8.1 g/dL   Albumin 2.7 (L) 3.5 - 5.0 g/dL   AST 51 (H) 15 - 41 U/L   ALT 35 14 - 54 U/L   Alkaline Phosphatase 172 (H) 38 - 126 U/L   Total Bilirubin 1.3 (H) 0.3 - 1.2 mg/dL   GFR calc non Af Amer >60 >60 mL/min   GFR calc Af Amer >60 >60 mL/min   Anion gap 15 5 - 15  I-STAT, chem 8     Status: Abnormal   Collection Time: 07/19/17  2:25 PM  Result Value Ref Range   Sodium 138 135 - 145 mmol/L   Potassium 3.3 (L) 3.5 - 5.1 mmol/L   Chloride  96 (L) 101 - 111 mmol/L   BUN 14 6 - 20 mg/dL   Creatinine, Ser 0.60 0.44 - 1.00 mg/dL   Glucose, Bld 142 (H) 65 - 99 mg/dL   Calcium, Ion 0.86 (LL) 1.15 - 1.40 mmol/L   TCO2 29 22 - 32 mmol/L   Hemoglobin 10.2 (L) 12.0 - 15.0 g/dL   HCT 30.0 (L) 36.0 - 46.0 %  Glucose, capillary     Status: None   Collection Time: 07/19/17  3:40 PM  Result Value Ref Range   Glucose-Capillary 98 65 - 99 mg/dL   Comment 1 Capillary Specimen   I-STAT 3, arterial blood gas (G3+)     Status: Abnormal   Collection Time: 07/19/17  6:49 PM  Result Value Ref Range   pH, Arterial 7.456 (H) 7.350 - 7.450   pCO2 arterial 42.4 32.0 - 48.0 mmHg   pO2, Arterial 79.0 (L) 83.0 - 108.0 mmHg   Bicarbonate 29.9 (H) 20.0 - 28.0 mmol/L   TCO2 31 22 - 32 mmol/L   O2 Saturation 96.0 %   Acid-Base Excess 5.0 (H) 0.0 - 2.0 mmol/L   Patient temperature 98.4 F    Sample type ARTERIAL   Glucose, capillary     Status: None   Collection Time: 07/19/17  7:54 PM  Result Value Ref Range   Glucose-Capillary 94 65 - 99 mg/dL   Comment 1 Notify RN    Comment 2 Document in Chart   Glucose, capillary     Status: None   Collection Time: 07/19/17 11:48 PM  Result Value Ref Range   Glucose-Capillary 92 65 - 99 mg/dL    Glucose, capillary     Status: None   Collection Time: 07/20/17 12:02 AM  Result Value Ref Range   Glucose-Capillary 83 65 - 99 mg/dL   Comment 1 Notify RN   Glucose, capillary     Status: None   Collection Time: 07/20/17  3:34 AM  Result Value Ref Range   Glucose-Capillary 85 65 - 99 mg/dL   Comment 1 Notify RN   Comprehensive metabolic panel     Status: Abnormal   Collection Time: 07/20/17  5:00 AM  Result Value Ref Range   Sodium 138 135 - 145 mmol/L   Potassium 2.8 (L) 3.5 - 5.1 mmol/L   Chloride 93 (L) 101 - 111 mmol/L   CO2 28 22 - 32 mmol/L   Glucose, Bld 101 (H) 65 - 99 mg/dL   BUN 18 6 - 20 mg/dL   Creatinine, Ser 0.74 0.44 - 1.00 mg/dL   Calcium 6.8 (L) 8.9 - 10.3 mg/dL   Total Protein 5.4 (L) 6.5 - 8.1 g/dL   Albumin 2.7 (L) 3.5 - 5.0 g/dL   AST 52 (H) 15 - 41 U/L   ALT 37 14 - 54 U/L   Alkaline Phosphatase 205 (H) 38 - 126 U/L   Total Bilirubin 2.0 (H) 0.3 - 1.2 mg/dL   GFR calc non Af Amer >60 >60 mL/min   GFR calc Af Amer >60 >60 mL/min   Anion gap 17 (H) 5 - 15  CBC     Status: Abnormal   Collection Time: 07/20/17  5:00 AM  Result Value Ref Range   WBC 13.8 (H) 4.0 - 10.5 K/uL   RBC 3.54 (L) 3.87 - 5.11 MIL/uL   Hemoglobin 10.4 (L) 12.0 - 15.0 g/dL   HCT 32.3 (L) 36.0 - 46.0 %   MCV 91.2 78.0 - 100.0 fL   MCH  29.4 26.0 - 34.0 pg   MCHC 32.2 30.0 - 36.0 g/dL   RDW 17.0 (H) 11.5 - 15.5 %   Platelets 308 150 - 400 K/uL  Cooxemetry Panel (carboxy, met, total hgb, O2 sat)     Status: Abnormal   Collection Time: 07/20/17  5:15 AM  Result Value Ref Range   Total hemoglobin 12.2 12.0 - 16.0 g/dL   O2 Saturation 52.3 %   Carboxyhemoglobin 1.6 (H) 0.5 - 1.5 %   Methemoglobin 0.9 0.0 - 1.5 %  Blood gas, arterial     Status: Abnormal   Collection Time: 07/20/17  5:20 AM  Result Value Ref Range   FIO2 60.00    O2 Content 25.0 L/min   Delivery systems HEATED NASAL CANNULA    pH, Arterial 7.539 (H) 7.350 - 7.450   pCO2 arterial 38.5 32.0 - 48.0 mmHg   pO2,  Arterial 55.3 (L) 83.0 - 108.0 mmHg   Bicarbonate 32.7 (H) 20.0 - 28.0 mmol/L   Acid-Base Excess 9.4 (H) 0.0 - 2.0 mmol/L   O2 Saturation 89.5 %   Patient temperature 98.6    Collection site A-LINE    Drawn by COLLECTED BY NURSE    Sample type ARTERIAL    Allens test (pass/fail) NOT INDICATED (A) PASS   Dg Chest Port 1 View  Result Date: 07/19/2017 CLINICAL DATA:  Status post removal of right ventricular assist device EXAM: PORTABLE CHEST 1 VIEW COMPARISON:  07/18/2017 FINDINGS: The ET tube tip is above the carina. Bilateral chest tubes and mediastinal drain remain in place. Interval removal of right IJ catheter. A right arm PICC line is noted with tip at the cavoatrial junction. No pleural effusion or edema. Subsegmental atelectasis in the right upper lobe and left base appear unchanged. IMPRESSION: 1. Right upper lobe and left base atelectasis. 2. Bilateral chest tubes in place without evidence for pneumothorax. Electronically Signed   By: Kerby Moors M.D.   On: 07/19/2017 08:49    Assessment/Plan: Diagnosis: Debility related to abdominal aneurysm/AS s/p repair with subsequent complications.  Patient currently intubated/on the ventilator 1. Does the need for close, 24 hr/day medical supervision in concert with the patient's rehab needs make it unreasonable for this patient to be served in a less intensive setting? Potentially 2. Co-Morbidities requiring supervision/potential complications: Hypertension, wound care, numerous cardiovascular considerations 3. Due to bladder management, bowel management, safety, skin/wound care, disease management, medication administration, pain management and patient education, does the patient require 24 hr/day rehab nursing? Potentially 4. Does the patient require coordinated care of a physician, rehab nurse, PT (1-2 hrs/day, 5 days/week), OT (1-2 hrs/day, 5 days/week) and SLP (1-2 hrs/day, 5 days/week) to address physical and functional deficits in the  context of the above medical diagnosis(es)? Potentially Addressing deficits in the following areas: balance, endurance, locomotion, strength, transferring, bowel/bladder control, bathing, dressing, feeding, grooming, toileting, cognition, swallowing and psychosocial support 5. Can the patient actively participate in an intensive therapy program of at least 3 hrs of therapy per day at least 5 days per week? Potentially 6. The potential for patient to make measurable gains while on inpatient rehab is good 7. Anticipated functional outcomes upon discharge from inpatient rehab are min assist and mod assist  with PT, min assist and mod assist with OT, min assist and mod assist with SLP. 8. Estimated rehab length of stay to reach the above functional goals is: TBD 9. Anticipated D/C setting: Home 10. Anticipated post D/C treatments: N/A 11. Overall Rehab/Functional Prognosis:  good and fair  RECOMMENDATIONS: This patient's condition is appropriate for continued rehabilitative care in the following setting: See below Patient has agreed to participate in recommended program. N/A Note that insurance prior authorization may be required for reimbursement for recommended care.  Comment: Patient currently intubated.  Will follow for medical stability and increased activity tolerance.  Meredith Staggers, MD, [facility] 07/20/2017    Lavon Paganini [place], PA-C 07/20/2017

## 2017-07-20 NOTE — Progress Notes (Signed)
Advanced Heart Failure Rounding Note  Primary Cardiologist: Dr. Percival Spanish (2014) HF: (New) Dr. Haroldine Laws   Subjective:    Events - 07/10/17 S/p AVR and ascending AA replacement  - 07/10/17 VF Arrest in evening with shock x 1 - 07/11/17 RVAD placement  - 07/17/2017 RVAD removed with chest closed -1/23 Extubated. Went in A fib. Amio started.   Now on high flow oxygen 60% high flow 40. Increased this morning. Now on milrinone 0.25 mcg. CO-OX 52%. Diuresed with Lasix drip. Brisk diuresis over night. Weight down 10 pounds.   Having hallucinations. Saw ponies in the room. Oriented to self.   Echo 06/02/2017 LVEF 55-60%, Grade 1 DD, Mild/Mod AI with LVOT, Mid ascending aortic diameter 48.88 mm, Mild LAE, RV normal  Objective:   Weight Range: 195 lb 5.2 oz (88.6 kg) Body mass index is 32.5 kg/m.   Vital Signs:   Temp:  [97 F (36.1 C)-98.9 F (37.2 C)] 97.7 F (36.5 C) (01/24 0803) Pulse Rate:  [37-151] 97 (01/24 0803) Resp:  [16-54] 27 (01/24 0803) BP: (95-129)/(48-99) 118/65 (01/24 0803) SpO2:  [80 %-98 %] 94 % (01/24 0808) Arterial Line BP: (103-165)/(45-69) 141/52 (01/24 0800) FiO2 (%):  [50 %-60 %] 60 % (01/24 0808) Weight:  [195 lb 5.2 oz (88.6 kg)] 195 lb 5.2 oz (88.6 kg) (01/24 0500) Last BM Date: (PTA)  Weight change: Filed Weights   07/18/17 0600 07/19/17 0500 07/20/17 0500  Weight: 214 lb 1.1 oz (97.1 kg) 205 lb 11 oz (93.3 kg) 195 lb 5.2 oz (88.6 kg)    Intake/Output:   Intake/Output Summary (Last 24 hours) at 07/20/2017 0823 Last data filed at 07/20/2017 0800 Gross per 24 hour  Intake 1116.83 ml  Output 6830 ml  Net -5713.17 ml    Physical Exam  CVP 10 General:  On high flow oxygen. Voice weak.  HEENT: normal Neck: supple.JVP 9-10 . Carotids 2+ bilat; no bruits. No lymphadenopathy or thryomegaly appreciated. Cor: PMI nondisplaced. Regular/Irregular rate & rhythm. No rubs, gallops or murmurs. Lungs: Rhonchi throughout  Abdomen: soft, nontender,  nondistended. No hepatosplenomegaly. No bruits or masses. Good bowel sounds. Extremities: no cyanosis, clubbing, rash, edema Neuro: alert & orientedx1 self, cranial nerves grossly intact. moves all 4 extremities w/o difficulty.    Telemetry  NSR in and out of A fib. 90-150s     EKG    No new tracings.    Labs    CBC Recent Labs    07/19/17 1418 07/19/17 1425 07/20/17 0500  WBC 19.9*  --  13.8*  HGB 10.2* 10.2* 10.4*  HCT 30.9* 30.0* 32.3*  MCV 90.9  --  91.2  PLT 286  --  025   Basic Metabolic Panel Recent Labs    07/19/17 0415 07/19/17 1418 07/19/17 1425 07/20/17 0500  NA 137 139 138 138  K 3.3* 3.3* 3.3* 2.8*  CL 101 98* 96* 93*  CO2 23 26  --  28  GLUCOSE 135* 141* 142* 101*  BUN 15 15 14 18   CREATININE 0.78 0.72 0.60 0.74  CALCIUM 6.8* 6.7*  --  6.8*  MG 1.4*  --   --  1.8   Liver Function Tests Recent Labs    07/19/17 1418 07/20/17 0500  AST 51* 52*  ALT 35 37  ALKPHOS 172* 205*  BILITOT 1.3* 2.0*  PROT 5.2* 5.4*  ALBUMIN 2.7* 2.7*   No results for input(s): LIPASE, AMYLASE in the last 72 hours. Cardiac Enzymes No results for input(s): CKTOTAL, CKMB,  CKMBINDEX, TROPONINI in the last 72 hours.  BNP: BNP (last 3 results) No results for input(s): BNP in the last 8760 hours.  ProBNP (last 3 results) No results for input(s): PROBNP in the last 8760 hours.   D-Dimer No results for input(s): DDIMER in the last 72 hours. Hemoglobin A1C No results for input(s): HGBA1C in the last 72 hours. Fasting Lipid Panel No results for input(s): CHOL, HDL, LDLCALC, TRIG, CHOLHDL, LDLDIRECT in the last 72 hours. Thyroid Function Tests No results for input(s): TSH, T4TOTAL, T3FREE, THYROIDAB in the last 72 hours.  Invalid input(s): FREET3  Other results:   Imaging    Dg Chest Port 1 View  Result Date: 07/20/2017 CLINICAL DATA:  Chest tube EXAM: PORTABLE CHEST 1 VIEW COMPARISON:  Yesterday FINDINGS: Interval tracheal extubation. A midline thoracic  drain has been removed. There are bilateral central lines with tips at the upper and lower SVC. Bilateral chest tubes. Probable trace right apical pneumothorax. Cardiomegaly. Status post aortic valve replacement. Stable inflation. Bilateral atelectasis. IMPRESSION: 1. Probable trace right apical pneumothorax. 2. Atelectasis with stable inflation after extubation. Electronically Signed   By: Monte Fantasia M.D.   On: 07/20/2017 08:07    Medications:    Scheduled Medications: . acetaminophen  1,000 mg Oral Q6H   Or  . acetaminophen (TYLENOL) oral liquid 160 mg/5 mL  1,000 mg Per Tube Q6H  . acetaminophen (TYLENOL) oral liquid 160 mg/5 mL  650 mg Per Tube Once   Or  . acetaminophen  650 mg Rectal Once  . aspirin EC  325 mg Oral Daily   Or  . aspirin  324 mg Per Tube Daily  . bisacodyl  10 mg Oral Daily   Or  . bisacodyl  10 mg Rectal Daily  . Chlorhexidine Gluconate Cloth  6 each Topical Daily  . docusate sodium  200 mg Oral Daily  . enoxaparin (LOVENOX) injection  40 mg Subcutaneous Q24H  . furosemide  40 mg Intravenous BID  . insulin aspart  0-24 Units Subcutaneous Q4H  . insulin detemir  10 Units Subcutaneous BID  . latanoprost  1 drop Both Eyes QHS  . levalbuterol  1.25 mg Nebulization Q6H  . mouth rinse  15 mL Mouth Rinse BID  . mometasone-formoterol  2 puff Inhalation BID  . naphazoline-glycerin  1-2 drop Both Eyes BID  . pantoprazole (PROTONIX) IV  40 mg Intravenous Q12H  . QUEtiapine  50 mg Oral QHS  . sodium chloride flush  10-40 mL Intracatheter Q12H  . sodium chloride flush  10-40 mL Intracatheter Q12H  . sodium chloride flush  3 mL Intravenous Q12H  . venlafaxine XR  75 mg Oral Daily    Infusions: . sodium chloride Stopped (07/18/17 1730)  . sodium chloride    . sodium chloride 1 mL/hr at 07/20/17 0800  . amiodarone 30 mg/hr (07/19/17 2345)  . amiodarone 30 mg/hr (07/20/17 0800)  . cefTAZidime (FORTAZ)  IV Stopped (07/20/17 0108)  . EPINEPHrine 4 mg in dextrose  5% 250 mL infusion (16 mcg/mL) Stopped (07/19/17 1705)  . feeding supplement (VITAL 1.5 CAL) Stopped (07/19/17 0550)  . lactated ringers Stopped (07/17/17 1200)  . magnesium sulfate 1 - 4 g bolus IVPB    . milrinone 0.25 mcg/kg/min (07/20/17 0800)  . norepinephrine (LEVOPHED) Adult infusion Stopped (07/19/17 1408)  . potassium chloride 10 mEq (07/20/17 0751)    PRN Medications: sodium chloride, fentaNYL (SUBLIMAZE) injection, metoprolol tartrate, midazolam, ondansetron (ZOFRAN) IV, oxyCODONE, sodium chloride flush  Patient Profile   [name] is a 77 y.o. female with history of fusiform ascending aortic aneurysm, HTN, and HLD.   Admitted 07/10/17 for planned AVR and replacement of ascending aortic aneurysm. Developed acute cardiogenic shock + VF arrest. Taken back to OR am of 07/11/17 for evacuation of hematoma and placement of RVAD.   Assessment/Plan   1. Cardiogenic shock s/p cardiac surgery with R>L CHF. - Pre-op Echo 06/02/2017 LVEF 55-60%, Grade 1 DD, Mild/Mod AI with LVOT, Mid ascending aortic diameter 48.88 mm, Mild LAE, RV normal - S/P RVAD removal 07/17/2017  - Remains on milrinone 0.25 mcg. Co-OX 52.3% CVP ~10. Now on 40 mg IV lasix twice a day per Dr Darcey Nora.   Per CT surgery .  -2. VF arrest - Stable. No further arrythmia.  - Goal K > 4.0 and Mg > 2.0.  Supplement K  Mag 1.8 .Give 2 grams mag.  3. Atrial fibrillation -In and out of A fib n NSR  - Continue amio 30 mg per hour.  4. Acute respiratory failure CCM concerned she cant protect airway. Increased oxygen required.  May require reintubation.  Repeat CXR pending..   5. S/p AVR and Ascending aortic aneurysm replacement. - Per surgery. No change. 6. AKI Renal function stable.  7. Anemia  Hgb 10.4   Length of Stay: [place], NP  8:23 AM Advanced Heart Failure Team Pager [phone] (M-F; 7a - 4p)  Please contact [facility] for night-coverage after hours (4p -7a ) and weekends on  [email or web address]  Agree.   Diuresed 10 pounds overnight. In/out of AF. Decompensated this am from respiratory standpoint and had to be reintubated. Now back in NSR  On exam Intubated/Sedated Cor Reg tachy Lungs Rhonchi Ab soft NT Ext 1-2+ edema warm  Overall, she is making progress but still weak and volume overloaded. Likely will need trach for slow vent wean. Continue diuresis. Continue amio for AF. At some point will need anticoagulation when acceptable from surgical standpoint.   CRITICAL CARE Performed by: Glori Bickers  Total critical care time: 35 minutes  Critical care time was exclusive of separately billable procedures and treating other patients.  Critical care was necessary to treat or prevent imminent or life-threatening deterioration.  Critical care was time spent personally by me (independent of midlevel providers or residents) on the following activities: development of treatment plan with patient and/or surrogate as well as nursing, discussions with consultants, evaluation of patient's response to treatment, examination of patient, obtaining history from patient or surrogate, ordering and performing treatments and interventions, ordering and review of laboratory studies, ordering and review of radiographic studies, pulse oximetry and re-evaluation of patient's condition.   Glori Bickers, MD  10:07 AM

## 2017-07-20 NOTE — Progress Notes (Signed)
3 Days Post-Op Procedure(s) (LRB): REMOVAL OF RVAD WITH PUMP STANDBY (N/A) TRANSESOPHAGEAL ECHOCARDIOGRAM (TEE) (N/A) Subjective: Patient re-intubated because of increased work of breathing- following  three sternal procedures for original resection of 5.2 cm thoracic aneurysm with AVR followed by RVAD placement followed by removal of RVAD and chest closure. Now stable. CXR clear RV function recovering Will resume tube feeds Patient will need percutaneous trach early next week Objective: Vital signs in last 24 hours: Temp:  [97 F (36.1 C)-98.9 F (37.2 C)] 97.7 F (36.5 C) (01/24 0803) Pulse Rate:  [37-151] 111 (01/24 1030) Cardiac Rhythm: Atrial fibrillation;Sinus tachycardia (01/24 0800) Resp:  [16-54] 31 (01/24 1100) BP: (93-140)/(48-88) 93/70 (01/24 1100) SpO2:  [80 %-99 %] 90 % (01/24 1039) Arterial Line BP: (115-165)/(50-69) 136/57 (01/24 1100) FiO2 (%):  [50 %-100 %] 100 % (01/24 1039) Weight:  [195 lb 5.2 oz (88.6 kg)] 195 lb 5.2 oz (88.6 kg) (01/24 0500)  Hemodynamic parameters for last 24 hours: CVP:  [10 mmHg-62 mmHg] 62 mmHg  Intake/Output from previous day: 01/23 0701 - 01/24 0700 In: 1257.8 [I.V.:857.8; IV Piggyback:400] Out: 6820 [Urine:6550; Chest Tube:270] Intake/Output this shift: Total I/O In: 63.3 [I.V.:63.3] Out: 360 [Urine:350; Chest Tube:10]       Exam    General- alert and comfortable on vent    Neck- no JVD, no cervical adenopathy palpable, no carotid bruit   Lungs- clear without rales, wheezes   Cor- regular rate and rhythm, no murmur , gallop   Abdomen- soft, non-tender   Extremities - warm, non-tender, minimal edema   Neuro- oriented, appropriate, no focal weakness   Lab Results: Recent Labs    07/19/17 1418 07/19/17 1425 07/20/17 0500  WBC 19.9*  --  13.8*  HGB 10.2* 10.2* 10.4*  HCT 30.9* 30.0* 32.3*  PLT 286  --  308   BMET:  Recent Labs    07/19/17 1418 07/19/17 1425 07/20/17 0500  NA 139 138 138  K 3.3* 3.3* 2.8*  CL  98* 96* 93*  CO2 26  --  28  GLUCOSE 141* 142* 101*  BUN 15 14 18   CREATININE 0.72 0.60 0.74  CALCIUM 6.7*  --  6.8*    PT/INR: No results for input(s): LABPROT, INR in the last 72 hours. ABG    Component Value Date/Time   PHART 7.606 (HH) 07/20/2017 1029   HCO3 33.9 (H) 07/20/2017 1029   TCO2 35 (H) 07/20/2017 1029   ACIDBASEDEF 1.0 07/18/2017 0404   O2SAT 94.0 07/20/2017 1029   CBG (last 3)  Recent Labs    07/20/17 0002 07/20/17 0334 07/20/17 0805  GLUCAP 83 85 84    Assessment/Plan: S/P Procedure(s) (LRB): REMOVAL OF RVAD WITH PUMP STANDBY (N/A) TRANSESOPHAGEAL ECHOCARDIOGRAM (TEE) (N/A) Diuresis Diabetes control keep intubated until trach next week   LOS: 10 days    [name] 07/20/2017

## 2017-07-20 NOTE — Progress Notes (Signed)
RN called [facility] and notified about abnormal lab values this morning.   No replacement for potassium; awaiting new orders.   Will continue to monitor.  Rejeana Fadness E [name], [place]

## 2017-07-20 NOTE — Progress Notes (Signed)
[facility] DAILY ICU PROGRESS NOTE                   [address]            [place] [address]          [phone]   3 Days Post-Op Procedure(s) (LRB): REMOVAL OF RVAD WITH PUMP STANDBY (N/A) TRANSESOPHAGEAL ECHOCARDIOGRAM (TEE) (N/A)  Total Length of Stay:  LOS: 10 days   Subjective: Feels fair, O2 requirement is increased  Objective: Vital signs in last 24 hours: Temp:  [97 F (36.1 C)-98.9 F (37.2 C)] 97.7 F (36.5 C) (01/24 0803) Pulse Rate:  [37-151] 97 (01/24 0803) Cardiac Rhythm: Normal sinus rhythm;Sinus tachycardia;Other (Comment) (01/24 0800) Resp:  [16-54] 27 (01/24 0803) BP: (95-129)/(48-99) 118/65 (01/24 0803) SpO2:  [80 %-98 %] 90 % (01/24 0803) Arterial Line BP: (103-165)/(45-69) 141/52 (01/24 0800) FiO2 (%):  [50 %-60 %] 60 % (01/24 0800) Weight:  [195 lb 5.2 oz (88.6 kg)] 195 lb 5.2 oz (88.6 kg) (01/24 0500)  Filed Weights   07/18/17 0600 07/19/17 0500 07/20/17 0500  Weight: 214 lb 1.1 oz (97.1 kg) 205 lb 11 oz (93.3 kg) 195 lb 5.2 oz (88.6 kg)    Weight change: -5.8 oz (-4.7 kg)   Hemodynamic parameters for last 24 hours: CVP:  [10 mmHg-16 mmHg] 11 mmHg  Intake/Output from previous day: 01/23 0701 - 01/24 0700 In: 1257.8 [I.V.:857.8; IV Piggyback:400] Out: 6820 [Urine:6550; Chest Tube:270]  Intake/Output this shift: No intake/output data recorded.  Current Meds: Scheduled Meds: . acetaminophen  1,000 mg Oral Q6H   Or  . acetaminophen (TYLENOL) oral liquid 160 mg/5 mL  1,000 mg Per Tube Q6H  . acetaminophen (TYLENOL) oral liquid 160 mg/5 mL  650 mg Per Tube Once   Or  . acetaminophen  650 mg Rectal Once  . aspirin EC  325 mg Oral Daily   Or  . aspirin  324 mg Per Tube Daily  . bisacodyl  10 mg Oral Daily   Or  . bisacodyl  10 mg Rectal Daily  . Chlorhexidine Gluconate Cloth  6 each Topical Daily  . docusate sodium  200 mg Oral Daily  . enoxaparin (LOVENOX) injection  40 mg Subcutaneous Q24H  . furosemide  40 mg  Intravenous BID  . insulin aspart  0-24 Units Subcutaneous Q4H  . insulin detemir  10 Units Subcutaneous BID  . latanoprost  1 drop Both Eyes QHS  . levalbuterol  1.25 mg Nebulization Q6H  . mouth rinse  15 mL Mouth Rinse BID  . mometasone-formoterol  2 puff Inhalation BID  . naphazoline-glycerin  1-2 drop Both Eyes BID  . pantoprazole (PROTONIX) IV  40 mg Intravenous Q12H  . potassium chloride  40 mEq Oral Q4H  . QUEtiapine  50 mg Oral QHS  . sodium chloride flush  10-40 mL Intracatheter Q12H  . sodium chloride flush  10-40 mL Intracatheter Q12H  . sodium chloride flush  3 mL Intravenous Q12H  . venlafaxine XR  75 mg Oral Daily   Continuous Infusions: . sodium chloride Stopped (07/18/17 1730)  . sodium chloride    . sodium chloride 1 mL/hr at 07/20/17 0600  . amiodarone 30 mg/hr (07/19/17 2345)  . amiodarone 30 mg/hr (07/20/17 8938)  . cefTAZidime (FORTAZ)  IV Stopped (07/20/17 0108)  . EPINEPHrine 4 mg in dextrose 5% 250 mL infusion (16 mcg/mL) Stopped (07/19/17 1705)  . feeding supplement (VITAL 1.5 CAL) Stopped (07/19/17 0550)  .  lactated ringers Stopped (07/17/17 1200)  . milrinone 0.25 mcg/kg/min (07/20/17 0600)  . norepinephrine (LEVOPHED) Adult infusion Stopped (07/19/17 1408)  . potassium chloride 10 mEq (07/20/17 0751)   PRN Meds:.sodium chloride, fentaNYL (SUBLIMAZE) injection, metoprolol tartrate, midazolam, ondansetron (ZOFRAN) IV, oxyCODONE, sodium chloride flush   General appearance: alert, cooperative, fatigued and no distress Heart: regular rate and rhythm Lungs: coarse with upper airway ronchi Abdomen: soft, non-tender; bowel sounds normal; no masses,  no organomegaly Extremities: + LE edema Wound: incis ok  Lab Results: CBC: Recent Labs    07/19/17 1418 07/19/17 1425 07/20/17 0500  WBC 19.9*  --  13.8*  HGB 10.2* 10.2* 10.4*  HCT 30.9* 30.0* 32.3*  PLT 286  --  308   BMET:  Recent Labs    07/19/17 1418 07/19/17 1425 07/20/17 0500  NA 139 138  138  K 3.3* 3.3* 2.8*  CL 98* 96* 93*  CO2 26  --  28  GLUCOSE 141* 142* 101*  BUN 15 14 18   CREATININE 0.72 0.60 0.74  CALCIUM 6.7*  --  6.8*    CMET: Lab Results  Component Value Date   WBC 13.8 (H) 07/20/2017   HGB 10.4 (L) 07/20/2017   HCT 32.3 (L) 07/20/2017   PLT 308 07/20/2017   GLUCOSE 101 (H) 07/20/2017   CHOL 165 03/15/2017   TRIG 93 03/15/2017   HDL 57 03/15/2017   LDLCALC 89 03/15/2017   ALT 37 07/20/2017   AST 52 (H) 07/20/2017   NA 138 07/20/2017   K 2.8 (L) 07/20/2017   CL 93 (L) 07/20/2017   CREATININE 0.74 07/20/2017   BUN 18 07/20/2017   CO2 28 07/20/2017   TSH 0.990 03/15/2017   INR 1.20 07/17/2017   HGBA1C 5.2 07/06/2017      PT/INR:  Recent Labs    07/17/17 1144  LABPROT 15.1  INR 1.20   Radiology: No results found.   Assessment/Plan: S/P Procedure(s) (LRB): REMOVAL OF RVAD WITH PUMP STANDBY (N/A) TRANSESOPHAGEAL ECHOCARDIOGRAM (TEE) (N/A)  1 stable but pulm status remains tenuous- on high flow- CCM managing, hopefully can avoid re- intubation. Compensating but still  Alkalotic/hypoxic- poor stamina, limited cardiac reserve, afib at times.  2 hypokalemia- replace 3 H.H ok, leukocytosis improved 4 cbg's adeq control 5 CXR stable in appearance overall 6 cont amio 7 wean milrinone as able 8 diurese as tolerated      Patrick Jupiter E Terre Hanneman 07/20/2017 8:09 AM

## 2017-07-20 NOTE — Progress Notes (Signed)
SLP Cancellation Note  Patient Details Name: MICIAH SHEALY MRN: [id number] DOB: Dec 22, 1940   Cancelled treatment:       Reason Eval/Treat Not Completed: Patient not medically ready. Intubated again this am. Will sign off and await new orders.    Chris Narasimhan, Katherene Ponto 07/20/2017, 9:26 AM

## 2017-07-21 ENCOUNTER — Inpatient Hospital Stay (HOSPITAL_COMMUNITY): Payer: Medicare Other

## 2017-07-21 DIAGNOSIS — I480 Paroxysmal atrial fibrillation: Secondary | ICD-10-CM

## 2017-07-21 LAB — BASIC METABOLIC PANEL
Anion gap: 14 (ref 5–15)
BUN: 28 mg/dL — AB (ref 6–20)
CHLORIDE: 96 mmol/L — AB (ref 101–111)
CO2: 31 mmol/L (ref 22–32)
CREATININE: 0.91 mg/dL (ref 0.44–1.00)
Calcium: 7.4 mg/dL — ABNORMAL LOW (ref 8.9–10.3)
GFR calc Af Amer: >60 mL/min (ref >60)
GFR calc non Af Amer: >60 mL/min (ref >60)
GLUCOSE: 122 mg/dL — AB (ref 65–99)
Potassium: 3.7 mmol/L (ref 3.5–5.1)
SODIUM: 141 mmol/L (ref 135–145)

## 2017-07-21 LAB — BLOOD GAS, ARTERIAL
Acid-Base Excess: 9.1 mmol/L — ABNORMAL HIGH (ref 0.0–2.0)
Bicarbonate: 33.3 mmol/L — ABNORMAL HIGH (ref 20.0–28.0)
FIO2: 50
MECHVT: 400 mL
O2 Saturation: 97.5 %
PEEP: 12 cmH2O
Patient temperature: 98.6
RATE: 12 resp/min
pCO2 arterial: 46.6 mmHg (ref 32.0–48.0)
pH, Arterial: 7.467 — ABNORMAL HIGH (ref 7.350–7.450)
pO2, Arterial: 98.9 mmHg (ref 83.0–108.0)

## 2017-07-21 LAB — GLUCOSE, CAPILLARY
Glucose-Capillary: 169 mg/dL — ABNORMAL HIGH (ref 65–99)
Glucose-Capillary: 182 mg/dL — ABNORMAL HIGH (ref 65–99)
Glucose-Capillary: 144 mg/dL — ABNORMAL HIGH (ref 65–99)

## 2017-07-21 LAB — CBC
HCT: 27.8 % — ABNORMAL LOW (ref 36.0–46.0)
Hemoglobin: 8.9 g/dL — ABNORMAL LOW (ref 12.0–15.0)
MCH: 29.8 pg (ref 26.0–34.0)
MCHC: 32 g/dL (ref 30.0–36.0)
MCV: 93 fL (ref 78.0–100.0)
Platelets: 402 10*3/uL — ABNORMAL HIGH (ref 150–400)
RBC: 2.99 MIL/uL — ABNORMAL LOW (ref 3.87–5.11)
RDW: 17.2 % — ABNORMAL HIGH (ref 11.5–15.5)
WBC: 23.1 10*3/uL — ABNORMAL HIGH (ref 4.0–10.5)

## 2017-07-21 LAB — COOXEMETRY PANEL
Carboxyhemoglobin: 1.6 % — ABNORMAL HIGH (ref 0.5–1.5)
Methemoglobin: 1.1 % (ref 0.0–1.5)
O2 Saturation: 71.6 %
Total hemoglobin: 10.6 g/dL — ABNORMAL LOW (ref 12.0–16.0)

## 2017-07-21 LAB — COMPREHENSIVE METABOLIC PANEL
ALT: 33 U/L (ref 14–54)
AST: 52 U/L — ABNORMAL HIGH (ref 15–41)
Albumin: 2.5 g/dL — ABNORMAL LOW (ref 3.5–5.0)
Alkaline Phosphatase: 188 U/L — ABNORMAL HIGH (ref 38–126)
Anion gap: 15 (ref 5–15)
BUN: 28 mg/dL — ABNORMAL HIGH (ref 6–20)
CO2: 28 mmol/L (ref 22–32)
Calcium: 6.5 mg/dL — ABNORMAL LOW (ref 8.9–10.3)
Chloride: 94 mmol/L — ABNORMAL LOW (ref 101–111)
Creatinine, Ser: 0.93 mg/dL (ref 0.44–1.00)
GFR calc Af Amer: >60 mL/min (ref >60)
GFR calc non Af Amer: 59 mL/min — ABNORMAL LOW (ref >60)
Glucose, Bld: 182 mg/dL — ABNORMAL HIGH (ref 65–99)
Potassium: 2.9 mmol/L — ABNORMAL LOW (ref 3.5–5.1)
Sodium: 137 mmol/L (ref 135–145)
Total Bilirubin: 1.5 mg/dL — ABNORMAL HIGH (ref 0.3–1.2)
Total Protein: 5 g/dL — ABNORMAL LOW (ref 6.5–8.1)

## 2017-07-21 LAB — MAGNESIUM
MAGNESIUM: 1.8 mg/dL (ref 1.7–2.4)
Magnesium: 2 mg/dL (ref 1.7–2.4)

## 2017-07-21 LAB — POCT I-STAT, CHEM 8
BUN: 27 mg/dL — ABNORMAL HIGH (ref 6–20)
Calcium, Ion: 0.79 mmol/L — CL (ref 1.15–1.40)
Chloride: 93 mmol/L — ABNORMAL LOW (ref 101–111)
Creatinine, Ser: 0.8 mg/dL (ref 0.44–1.00)
Glucose, Bld: 159 mg/dL — ABNORMAL HIGH (ref 65–99)
HCT: 26 % — ABNORMAL LOW (ref 36.0–46.0)
Hemoglobin: 8.8 g/dL — ABNORMAL LOW (ref 12.0–15.0)
Potassium: 3.6 mmol/L (ref 3.5–5.1)
Sodium: 138 mmol/L (ref 135–145)
TCO2: 32 mmol/L (ref 22–32)

## 2017-07-21 LAB — PHOSPHORUS
PHOSPHORUS: 3.4 mg/dL (ref 2.5–4.6)
Phosphorus: 3.7 mg/dL (ref 2.5–4.6)

## 2017-07-21 MED ORDER — POTASSIUM CHLORIDE 10 MEQ/50ML IV SOLN
10.0000 meq | INTRAVENOUS | Status: AC
  Administered 2017-07-21 (×3): 10 meq via INTRAVENOUS
  Filled 2017-07-21 (×3): qty 50

## 2017-07-21 MED ORDER — SODIUM CHLORIDE 0.9 % IV SOLN
1.0000 g | Freq: Once | INTRAVENOUS | Status: DC
  Filled 2017-07-21: qty 10

## 2017-07-21 MED ORDER — POTASSIUM CHLORIDE 10 MEQ/50ML IV SOLN
10.0000 meq | INTRAVENOUS | Status: AC | PRN
  Administered 2017-07-21 – 2017-07-22 (×3): 10 meq via INTRAVENOUS
  Filled 2017-07-21 (×3): qty 50

## 2017-07-21 MED ORDER — POTASSIUM CHLORIDE 20 MEQ/15ML (10%) PO SOLN
40.0000 meq | ORAL | Status: AC
  Administered 2017-07-21 (×2): 40 meq
  Filled 2017-07-21 (×2): qty 30

## 2017-07-21 MED ORDER — ALBUMIN HUMAN 5 % IV SOLN
12.5000 g | Freq: Once | INTRAVENOUS | Status: AC
  Administered 2017-07-21: 12.5 g via INTRAVENOUS
  Filled 2017-07-21: qty 250

## 2017-07-21 MED ORDER — FUROSEMIDE 10 MG/ML IJ SOLN
40.0000 mg | Freq: Three times a day (TID) | INTRAMUSCULAR | Status: DC
  Administered 2017-07-21 (×2): 40 mg via INTRAVENOUS
  Filled 2017-07-21 (×2): qty 4

## 2017-07-21 NOTE — Progress Notes (Signed)
OT Cancellation Note  Patient Details Name: [name] MRN: [id number] DOB: [birth year]   Cancelled Treatment:    Reason Eval/Treat Not Completed: Medical issues which prohibited therapy(on ventilator and sedated). OT will check back after the weekend for medical appropriateness.   [place] 07/21/2017, 1:27 PM  Hulda Humphrey OTR/L [phone]

## 2017-07-21 NOTE — Progress Notes (Signed)
.. ..  Name: [name] MRN: [id number] DOB: Mar 08, 1941    ADMISSION DATE:  07/10/2017  CONSULTATION DATE:  07/12/17  REFERRING MD :  Nils Pyle MD  CHIEF COMPLAINT:   BRIEF PATIENT DESCRIPTION: SIGNIFICANT EVENTS  77 yr old female POD 1 TEE, Open Heart with evacuation of Hematoma and placement of RVAD.  STUDIES: Echo12/12/2016 LVEF 55-60%, Grade 1 DD, Mild/Mod AI with LVOT, Mid ascending aortic diameter 48.88 mm, Mild LAE, RV normal    BRIEF:   77 year old female with past medical history significant for fusiform ascending aortic aneurysm, hypertension, hyperlipidemia, status post AVR and replacement of ascending aortic aneurysm.  On 07/10/2017 postprocedure patient developed acute cardiogenic shock had an episode of V. fib arrest was taken back to the OR on 07/11/2017 for evacuation of hematoma and placement of RVAD.  PCCM consulted for ventilator management.   Events - 07/10/17 Admit - 07/10/17 S/p AVR and ascending AA replacement  - 07/10/17 VF Arrest in evening with shock x 1 - 07/11/17 to OR for evacuation of hematoma and RVAD placement  - 07/12/17 PCCM consult 07/15/2017 Lasix per cardiology CVP of 5 1/22 heavily diuresed  1/23 ready for extubation 1/24: reintubated  Culture data 1/24 sputum: few wbc, rare GPC pairs and clusters>>>  abx Ceftaz1/17>>>1.20 Cefuroxime 1/21>>1/23 ceftaz 1/23>>>  SUBJECTIVE/OVERNIGHT/INTERVAL HX reintubated 1/24  VITAL SIGNS: Temp:  [97.7 F (36.5 C)-98.9 F (37.2 C)] 98.5 F (36.9 C) (01/25 0752) Pulse Rate:  [62-159] 98 (01/25 0915) Resp:  [9-40] 20 (01/25 0915) BP: (89-144)/(42-82) 128/60 (01/25 0900) SpO2:  [90 %-100 %] 97 % (01/25 0915) Arterial Line BP: (87-136)/(43-64) 132/58 (01/25 0915) FiO2 (%):  [50 %-100 %] 50 % (01/25 0835) Weight:  [197 lb 12 oz (89.7 kg)] 197 lb 12 oz (89.7 kg) (01/25 0500)   Filed Weights   07/19/17 0500 07/20/17 0500 07/21/17 0500  Weight: 205 lb 11 oz (93.3 kg) 195 lb 5.2 oz (88.6 kg) 197 lb  12 oz (89.7 kg)    Intake/Output Summary (Last 24 hours) at 07/21/2017 1039 Last data filed at 07/21/2017 1000 Gross per 24 hour  Intake 2092.08 ml  Output 3060 ml  Net -967.92 ml     PHYSICAL EXAMINATION: General: This is a 77 year old female now sedated on ventilator.  No acute distress. HEENT: Normocephalic atraumatic orally intubated Pulmonary: Scattered rhonchi, no accessory use on ventilator Thorax: Anterior chest tubes unremarkable.  Sternal dressing and chest sutures intact. Cardiac: Regular rate and rhythm Abdomen: Soft nontender Extremities: Trace edema, brisk cap refill, warm, strong pulses. GU: Clear yellow Neuro: Alert, interactive but restless, moves all extremities  PULMONARY Recent Labs  Lab 07/19/17 0828 07/19/17 1313 07/19/17 1425 07/19/17 1849  07/20/17 0520 07/20/17 1029 07/20/17 1215 07/21/17 0331 07/21/17 0348  PHART 7.486* 7.476*  --  7.456*  --  7.539* 7.606* 7.565* 7.467*  --   PCO2ART 35.1 40.6  --  42.4  --  38.5 33.9 35.5 46.6  --   PO2ART 74.0* 67.0*  --  79.0*  --  55.3* 58.0* 66.4* 98.9  --   HCO3 26.5 29.9*  --  29.9*  --  32.7* 33.9* 32.4* 33.3*  --   TCO2 28 31 29 31   --   --  35*  --   --   --   O2SAT 96.0 94.0  --  96.0   < > 89.5 94.0 94.6 97.5 71.6   < > = values in this interval not displayed.    CBC Recent Labs  Lab 07/19/17 1418 07/19/17 1425 07/20/17 0500 07/21/17 0300  HGB 10.2* 10.2* 10.4* 8.9*  HCT 30.9* 30.0* 32.3* 27.8*  WBC 19.9*  --  13.8* 23.1*  PLT 286  --  308 402*    COAGULATION Recent Labs  Lab 07/17/17 1144  INR 1.20    CARDIAC  No results for input(s): TROPONINI in the last 168 hours. No results for input(s): PROBNP in the last 168 hours.   CHEMISTRY Recent Labs  Lab 07/16/17 0233  07/17/17 0346  07/19/17 0415 07/19/17 1418 07/19/17 1425 07/20/17 0500 07/20/17 0902 07/20/17 1510 07/21/17 0300  NA 134*   < > 138   < > 137 139 138 138  --  139 137  K 4.2   < > 4.2   < > 3.3* 3.3* 3.3*  2.8*  --  3.3* 2.9*  CL 98*   < > 100*   < > 101 98* 96* 93*  --  94* 94*  CO2 26  --  28   < > 23 26  --  28  --  29 28  GLUCOSE 175*   < > 126*   < > 135* 141* 142* 101*  --  85 182*  BUN 10   < > 8   < > 15 15 14 18   --  21* 28*  CREATININE 0.58   < > 0.57   < > 0.78 0.72 0.60 0.74  --  0.86 0.93  CALCIUM 8.7*  --  8.0*   < > 6.8* 6.7*  --  6.8*  --  6.5* 6.5*  MG 1.5*  --  1.3*   < > 1.4*  --   --  1.8 1.7 1.6* 2.0  PHOS 5.6*  --  3.8  --   --   --   --   --  3.9 3.9 3.7   < > = values in this interval not displayed.   Estimated Creatinine Clearance: 57.8 mL/min (by C-G formula based on SCr of 0.93 mg/dL).   LIVER Recent Labs  Lab 07/15/17 0231 07/17/17 1144 07/19/17 1418 07/20/17 0500 07/21/17 0300  AST 34  --  51* 52* 52*  ALT 25  --  35 37 33  ALKPHOS 73  --  172* 205* 188*  BILITOT 1.1  --  1.3* 2.0* 1.5*  PROT 4.4*  --  5.2* 5.4* 5.0*  ALBUMIN 2.3*  --  2.7* 2.7* 2.5*  INR  --  1.20  --   --   --     INFECTIOUS Recent Labs  Lab 07/15/17 0231 07/16/17 0233  PROCALCITON 0.67 0.50    ENDOCRINE CBG (last 3)  Recent Labs    07/20/17 2325 07/21/17 0315 07/21/17 0801  GLUCAP 145* 169* 182*     IMAGING x48h  - image(s) personally visualized  -   highlighted in bold Dg Chest Port 1 View  Result Date: 07/21/2017 CLINICAL DATA:  Hypoxia EXAM: PORTABLE CHEST 1 VIEW COMPARISON:  July 20, 2017 FINDINGS: Endotracheal tube tip is 2.6 cm above the carina. Nasogastric tube tip and side port are in the stomach. There is a chest tube on the right. Right-sided central catheter tip is in the superior vena cava. Left-sided central catheter tip is also in the superior vena cava. Temporary pacemaker wires are attached to the right heart. No pneumothorax. There is patchy bibasilar atelectasis. There is no frank edema or consolidation. Heart is upper normal in size with pulmonary vascularity within normal limits. Postoperative changes  noted in the proximal left humerus. There  are surgical clips in the right axillary region. There are skin staples over the right axillary region. There are also surgical clips in the cervicothoracic junction region. Patient is status post aortic valve replacement. IMPRESSION: Tube and catheter positions as described without pneumothorax. Bibasilar atelectasis. No consolidation. Stable cardiac silhouette. Electronically Signed   By: Lowella Grip III M.D.   On: 07/21/2017 08:09   Portable Chest X-ray  Result Date: 07/20/2017 CLINICAL DATA:  Intubation EXAM: PORTABLE CHEST 1 VIEW COMPARISON:  Portable exam 0922 hours compared to 0611 hours FINDINGS: Tip of endotracheal tube projects 3.6 cm above carina. LEFT subclavian central venous catheter with tip projecting over SVC. RIGHT arm PICC line tip projects over superior RIGHT atrium. Epicardial pacing leads present. BILATERAL thoracostomy tubes identified. Enlargement of cardiac silhouette post median sternotomy and AVR. Tortuous aorta. Decreased lung volumes with bibasilar atelectasis. No definite infiltrate or pleural effusion. Small RIGHT apex pneumothorax again identified. IMPRESSION: Small RIGHT apex pneumothorax in patient with thoracostomy tubes bilaterally. Enlargement of cardiac silhouette post AVR. Satisfactory position of endotracheal tube. Electronically Signed   By: Lavonia Dana M.D.   On: 07/20/2017 09:34   Dg Chest Port 1 View  Result Date: 07/20/2017 CLINICAL DATA:  Chest tube EXAM: PORTABLE CHEST 1 VIEW COMPARISON:  Yesterday FINDINGS: Interval tracheal extubation. A midline thoracic drain has been removed. There are bilateral central lines with tips at the upper and lower SVC. Bilateral chest tubes. Probable trace right apical pneumothorax. Cardiomegaly. Status post aortic valve replacement. Stable inflation. Bilateral atelectasis. IMPRESSION: 1. Probable trace right apical pneumothorax. 2. Atelectasis with stable inflation after extubation. Electronically Signed   By: Monte Fantasia M.D.   On: 07/20/2017 08:07   Dg Abd Portable 1v  Result Date: 07/20/2017 CLINICAL DATA:  Encounter for orogastric tube placement EXAM: PORTABLE ABDOMEN - 1 VIEW COMPARISON:  None. FINDINGS: Imaging of the abdomen centered over the left upper quadrant was performed for assessment of gastric tube placement. The tip and side port of a gastric tube are seen below the left hemidiaphragm in the expected location of the stomach, the tip near the expected location of the gastric cardia. Chest tube is also seen projecting up to the left mid thorax. Median sternotomy sutures are in place. Plate and screw fixation hardware is partially included involving the proximal left humerus. IMPRESSION: Gastric tube with tip and side port are in the expected location of the stomach. Left-sided chest tube projects up to the mid thorax. Electronically Signed   By: Ashley Royalty M.D.   On: 07/20/2017 21:14     ASSESSMENT / PLAN: 77 year old female with PMHx significant for fusiform ascending aortic aneurysm, hypertension, hyperlipidemia, status post AVR and replacement of ascending aortic aneurysm.  On 07/10/2017 postprocedure patient developed acute cardiogenic shock had an episode of V. fib arrest was taken back to the OR on 07/11/2017 for evacuation of hematoma and placement of RVAD.  RVAD now removed, weaning pressors.   ASSESSMENT / PLAN:  Cardiogenic shock s/p removal of RVAD 1/21; sternum now closed Atrial fibrillation per tele Medication related hypotension.  Back on levophed since started on propofol Still on milrinone and amio -->may require pressors after intubation  Plan Continue pressors and inotropic support No change in amiodarone Continue diuresis Above directed by heart failure and cardiothoracic team  Acute respiratory failure initially in setting of cardiogenic shock, as of 1/24 mechanism seems to be atelectasis, poor cough mechanics, deconditioning +/- HF: Required  reintubation Chest x-ray  personally reviewed: Tubes and lines are in satisfactory position.  Right greater than left basilar atelectasis may be a little more pronounced than when compared to film on 1/24 Sedated to avoid respiratory alkalosis which seems to be exacerbated by agitation Plan Continue full ventilator support, decreasing PEEP to 10; work on weaning down over the weekend Continue 7 mL's per kilogram ventilatory strategy Repeat a.m. ABG Continue diuresis Plan for trach next week (giving time for sternum to heal) PAD protocol RASS goal -2  Rising leukocytosis -was being treated for HCAP (NOS) Plan F/u pending cultures (sent 1/24) Day 3 ceftaz but day 9 total abx   Acute encephalopathy Plan Continue PAD protocol RASS goal -2  Hypokalemia and hypomagnesemia -->exacerbated by diuresis  Plan Replace and recheck  anemia of critical illness; no evidence of bleeding however has had significant drift.  Question could this be hemo-dilution given 2 pound weight gain Thrombocytopenia  Plan And CBC Transfuse per protocol  Hyperglycemia  Plan Sliding scale insulin  FAMILY  - Updates 07/16/2017 no family at bedside  - Inter-disciplinary family meet or Palliative Care meeting due by:  DAy 7. Current LOS is LOS 11 days  DISPO  DVT prophylaxis: lmwh SUP: PPI  Diet: tubefeeds Activity: BR Disposition : ICU   Erick Colace ACNP-BC [place] Pager # [phone] OR # [phone] if no answer  Attending Note:  77 year old female with cardiogenic shock and associated respiratory failure after multiple cardiac surgeries.  Hypotensive overnight requiring pressors but suspect sedation is a large factor there.  On exam, diffuse crackles noted.  I reviewed CXR myself, ETT is ok and pulmonary edema noted.  Will begin PS trials but no extubation.  Pressors for BP support.  Will place tracheostomy early next week.  Discussed with PCCM-NP.  Replace electrolytes.  Continue diuretics.  PCCM will  follow.  The patient is critically ill with multiple organ systems failure and requires high complexity decision making for assessment and support, frequent evaluation and titration of therapies, application of advanced monitoring technologies and extensive interpretation of multiple databases.   Critical Care Time devoted to patient care services described in this note is  35  Minutes. This time reflects time of care of this signee Dr Jennet Maduro. This critical care time does not reflect procedure time, or teaching time or supervisory time of PA/NP/Med student/Med Resident etc but could involve care discussion time.  Rush Farmer, M.D. [facility] Pulmonary/Critical Care Medicine. Pager: [phone]. After hours pager: [phone].

## 2017-07-21 NOTE — Progress Notes (Signed)
RN noticed levophed ml/hr rate on IV pump did not correlate with ml/hr rate on MAR despite the mcg/min matching. Immediately rechecked dosages on pump/MAR and noticed patient is receiving quad strength levophed, but the IV pump was not programmed for quad strength. Patient remains stable. MD to be notified.

## 2017-07-21 NOTE — Progress Notes (Signed)
CT surgery p.m. Rounds  Patient sedated on ventilator Hemodynamics stable Good urine output Plan percutaneous tracheostomy early next week for ventilator dependence due to deconditioning and weakness Situation discussed with patient's husband and family at bedside and they understand the plan of care.

## 2017-07-21 NOTE — Progress Notes (Addendum)
PT Cancellation Note  Patient Details Name: [name] MRN: [id number] DOB: Mar 21, 1941   Cancelled Treatment:    Reason Eval/Treat Not Completed: Patient not medically ready. Per RN, pt reintubated yesterday and currently sedated. PT will check back after the weekend for medical appropriateness.   Mabeline Caras, PT, DPT Acute Rehab Services  Pager: [phone] 07/21/2017, 1:29 PM

## 2017-07-21 NOTE — Progress Notes (Signed)
[facility] ADULT ICU REPLACEMENT PROTOCOL FOR AM LAB REPLACEMENT ONLY  The patient does apply for the [facility] Adult ICU Electrolyte Replacment Protocol based on the criteria listed below:   1. Is GFR >/= 40 ml/min? Yes.    Patient's GFR today is >60 2. Is urine output >/= 0.5 ml/kg/hr for the last 6 hours? Yes.   Patient's UOP is 1.4 ml/kg/hr 3. Is BUN < 60 mg/dL? Yes.    Patient's BUN today is 28 4. Abnormal electrolyte(s):K2.8 5. Ordered repletion with: per protocol 6. If a panic level lab has been reported, has the CCM MD in charge been notified? Yes.  .   Physician:  Mauri Brooklyn, MD  [facility] 07/21/2017 5:41 AM

## 2017-07-21 NOTE — Plan of Care (Signed)
  Progressing Clinical Measurements: Ability to maintain clinical measurements within normal limits will improve 07/21/2017 1238 - Progressing by Hillard Danker, RN Diagnostic test results will improve 07/21/2017 1238 - Progressing by Hillard Danker, RN Nutrition: Adequate nutrition will be maintained 07/21/2017 1238 - Progressing by Hillard Danker, RN Pain Managment: General experience of comfort will improve 07/21/2017 1238 - Progressing by Hillard Danker, RN

## 2017-07-21 NOTE — Progress Notes (Signed)
Terri-Link MD notified about low K+. Waiting on orders to replace.   Will continue to monitor patient.   [name] [name], [place]

## 2017-07-21 NOTE — Progress Notes (Signed)
Cortrak Tube Team Note:  Consult received to place a Cortrak feeding tube.   A 10 F Cortrak tube was placed in the left nare and secured with a nasal bridle at 70 cm. Per the Cortrak monitor reading the tube tip is gastric.   No x-ray is required. RN may begin using tube.   If the tube becomes dislodged please keep the tube and contact the Cortrak team at [email or web address] (password [email or web address]) for replacement.  If after hours and replacement cannot be delayed, place a NG tube and confirm placement with an abdominal x-ray.    [name] RD, LDN Clinical Nutrition Pager # [phone]

## 2017-07-21 NOTE — Progress Notes (Signed)
Advanced Heart Failure Rounding Note  Primary Cardiologist: Dr. Percival Spanish (2014) HF: (New) Dr. Haroldine Laws   Subjective:    Events - 07/10/17 S/p AVR and ascending AA replacement  - 07/10/17 VF Arrest in evening with shock x 1 - 07/11/17 RVAD placement  - 07/17/2017 RVAD removed with chest closed -1/23 Extubated. Went in A fib. Amio started.  -1/24 Reintubated.   Yesterday reintubated. On amio drip 30 mg per hour, remains in NSR. NE 7 mcg and milrinone 0.25 mcg. Todays CO-OX is 72%.  Diuresing well but weight up 2 pounds.   Awake and follows commands.   Echo 06/02/2017 LVEF 55-60%, Grade 1 DD, Mild/Mod AI with LVOT, Mid ascending aortic diameter 48.88 mm, Mild LAE, RV normal  Objective:   Weight Range: 197 lb 12 oz (89.7 kg) Body mass index is 32.91 kg/m.   Vital Signs:   Temp:  [97.7 F (36.5 C)-98.9 F (37.2 C)] 98.5 F (36.9 C) (01/25 0752) Pulse Rate:  [62-159] 97 (01/25 0700) Resp:  [9-40] 23 (01/25 0700) BP: (89-144)/(42-82) 99/46 (01/25 0700) SpO2:  [90 %-100 %] 95 % (01/25 0700) Arterial Line BP: (87-140)/(43-64) 91/49 (01/25 0700) FiO2 (%):  [60 %-100 %] 60 % (01/25 0554) Weight:  [197 lb 12 oz (89.7 kg)] 197 lb 12 oz (89.7 kg) (01/25 0500) Last BM Date: (PTA)  Weight change: Filed Weights   07/19/17 0500 07/20/17 0500 07/21/17 0500  Weight: 205 lb 11 oz (93.3 kg) 195 lb 5.2 oz (88.6 kg) 197 lb 12 oz (89.7 kg)    Intake/Output:   Intake/Output Summary (Last 24 hours) at 07/21/2017 0827 Last data filed at 07/21/2017 0600 Gross per 24 hour  Intake 1688.03 ml  Output 2485 ml  Net -796.97 ml    Physical Exam  CVP 12 General:  Intubated. No resp difficulty HEENT: ETT  Neck: supple. JVP 11-12. Carotids 2+ bilat; no bruits. No lymphadenopathy or thryomegaly appreciated. Cor: PMI nondisplaced. Regular rate & rhythm. No rubs, gallops or murmurs. R upper chest staples.  Lungs: Rhonchi throughout Abdomen: soft, nontender, nondistended. No hepatosplenomegaly.  No bruits or masses. Good bowel sounds. Extremities: no cyanosis, clubbing, rash, Rand LLE 1+  Edema SCDs Neuro: Intubated. Follows commands. MAE.    Telemetry  NSR 90s personally reviewed.    EKG    No new tracings.    Labs    CBC Recent Labs    07/20/17 0500 07/21/17 0300  WBC 13.8* 23.1*  HGB 10.4* 8.9*  HCT 32.3* 27.8*  MCV 91.2 93.0  PLT 308 672*   Basic Metabolic Panel Recent Labs    07/20/17 1510 07/21/17 0300  NA 139 137  K 3.3* 2.9*  CL 94* 94*  CO2 29 28  GLUCOSE 85 182*  BUN 21* 28*  CREATININE 0.86 0.93  CALCIUM 6.5* 6.5*  MG 1.6* 2.0  PHOS 3.9 3.7   Liver Function Tests Recent Labs    07/20/17 0500 07/21/17 0300  AST 52* 52*  ALT 37 33  ALKPHOS 205* 188*  BILITOT 2.0* 1.5*  PROT 5.4* 5.0*  ALBUMIN 2.7* 2.5*   No results for input(s): LIPASE, AMYLASE in the last 72 hours. Cardiac Enzymes No results for input(s): CKTOTAL, CKMB, CKMBINDEX, TROPONINI in the last 72 hours.  BNP: BNP (last 3 results) No results for input(s): BNP in the last 8760 hours.  ProBNP (last 3 results) No results for input(s): PROBNP in the last 8760 hours.   D-Dimer No results for input(s): DDIMER in the last 72  hours. Hemoglobin A1C No results for input(s): HGBA1C in the last 72 hours. Fasting Lipid Panel Recent Labs    07/20/17 1510  TRIG 134   Thyroid Function Tests No results for input(s): TSH, T4TOTAL, T3FREE, THYROIDAB in the last 72 hours.  Invalid input(s): FREET3  Other results:   Imaging    Dg Chest Port 1 View  Result Date: 07/21/2017 CLINICAL DATA:  Hypoxia EXAM: PORTABLE CHEST 1 VIEW COMPARISON:  July 20, 2017 FINDINGS: Endotracheal tube tip is 2.6 cm above the carina. Nasogastric tube tip and side port are in the stomach. There is a chest tube on the right. Right-sided central catheter tip is in the superior vena cava. Left-sided central catheter tip is also in the superior vena cava. Temporary pacemaker wires are attached to the  right heart. No pneumothorax. There is patchy bibasilar atelectasis. There is no frank edema or consolidation. Heart is upper normal in size with pulmonary vascularity within normal limits. Postoperative changes noted in the proximal left humerus. There are surgical clips in the right axillary region. There are skin staples over the right axillary region. There are also surgical clips in the cervicothoracic junction region. Patient is status post aortic valve replacement. IMPRESSION: Tube and catheter positions as described without pneumothorax. Bibasilar atelectasis. No consolidation. Stable cardiac silhouette. Electronically Signed   By: Lowella Grip III M.D.   On: 07/21/2017 08:09   Portable Chest X-ray  Result Date: 07/20/2017 CLINICAL DATA:  Intubation EXAM: PORTABLE CHEST 1 VIEW COMPARISON:  Portable exam 0922 hours compared to 0611 hours FINDINGS: Tip of endotracheal tube projects 3.6 cm above carina. LEFT subclavian central venous catheter with tip projecting over SVC. RIGHT arm PICC line tip projects over superior RIGHT atrium. Epicardial pacing leads present. BILATERAL thoracostomy tubes identified. Enlargement of cardiac silhouette post median sternotomy and AVR. Tortuous aorta. Decreased lung volumes with bibasilar atelectasis. No definite infiltrate or pleural effusion. Small RIGHT apex pneumothorax again identified. IMPRESSION: Small RIGHT apex pneumothorax in patient with thoracostomy tubes bilaterally. Enlargement of cardiac silhouette post AVR. Satisfactory position of endotracheal tube. Electronically Signed   By: Lavonia Dana M.D.   On: 07/20/2017 09:34   Dg Abd Portable 1v  Result Date: 07/20/2017 CLINICAL DATA:  Encounter for orogastric tube placement EXAM: PORTABLE ABDOMEN - 1 VIEW COMPARISON:  None. FINDINGS: Imaging of the abdomen centered over the left upper quadrant was performed for assessment of gastric tube placement. The tip and side port of a gastric tube are seen below the  left hemidiaphragm in the expected location of the stomach, the tip near the expected location of the gastric cardia. Chest tube is also seen projecting up to the left mid thorax. Median sternotomy sutures are in place. Plate and screw fixation hardware is partially included involving the proximal left humerus. IMPRESSION: Gastric tube with tip and side port are in the expected location of the stomach. Left-sided chest tube projects up to the mid thorax. Electronically Signed   By: Ashley Royalty M.D.   On: 07/20/2017 21:14    Medications:    Scheduled Medications: . acetaminophen  1,000 mg Oral Q6H   Or  . acetaminophen (TYLENOL) oral liquid 160 mg/5 mL  1,000 mg Per Tube Q6H  . acetaminophen (TYLENOL) oral liquid 160 mg/5 mL  650 mg Per Tube Once   Or  . acetaminophen  650 mg Rectal Once  . arformoterol  15 mcg Nebulization BID  . aspirin EC  325 mg Oral Daily   Or  .  aspirin  324 mg Per Tube Daily  . bisacodyl  10 mg Oral Daily   Or  . bisacodyl  10 mg Rectal Daily  . budesonide (PULMICORT) nebulizer solution  0.5 mg Nebulization BID  . chlorhexidine gluconate (MEDLINE KIT)  15 mL Mouth Rinse BID  . Chlorhexidine Gluconate Cloth  6 each Topical Daily  . docusate sodium  200 mg Oral Daily  . enoxaparin (LOVENOX) injection  40 mg Subcutaneous Q24H  . feeding supplement (PRO-STAT SUGAR FREE 64)  30 mL Per Tube BID  . feeding supplement (VITAL AF 1.2 CAL)  1,000 mL Per Tube Q24H  . furosemide  40 mg Intravenous BID  . insulin aspart  0-24 Units Subcutaneous Q4H  . insulin detemir  10 Units Subcutaneous BID  . latanoprost  1 drop Both Eyes QHS  . levalbuterol  1.25 mg Nebulization Q6H  . mouth rinse  15 mL Mouth Rinse 10 times per day  . naphazoline-glycerin  1-2 drop Both Eyes BID  . pantoprazole (PROTONIX) IV  40 mg Intravenous Q12H  . potassium chloride  40 mEq Per Tube Q4H  . QUEtiapine  50 mg Per Tube QHS  . sodium chloride flush  10-40 mL Intracatheter Q12H  . sodium chloride  flush  10-40 mL Intracatheter Q12H  . sodium chloride flush  3 mL Intravenous Q12H  . venlafaxine XR  75 mg Oral Daily    Infusions: . sodium chloride Stopped (07/18/17 1730)  . sodium chloride    . sodium chloride 1 mL/hr at 07/21/17 0600  . amiodarone 30 mg/hr (07/20/17 2348)  . amiodarone 30 mg/hr (07/21/17 0600)  . cefTAZidime (FORTAZ)  IV 1 g (07/21/17 0813)  . EPINEPHrine 4 mg in dextrose 5% 250 mL infusion (16 mcg/mL) Stopped (07/19/17 1705)  . fentaNYL infusion INTRAVENOUS 160 mcg/hr (07/21/17 0826)  . lactated ringers Stopped (07/17/17 1200)  . milrinone 0.25 mcg/kg/min (07/21/17 0600)  . norepinephrine (LEVOPHED) Adult infusion 7 mcg/min (07/21/17 0703)  . potassium chloride 10 mEq (07/21/17 0807)  . propofol (DIPRIVAN) infusion 30 mcg/kg/min (07/21/17 0612)    PRN Medications: sodium chloride, fentaNYL, fentaNYL (SUBLIMAZE) injection, metoprolol tartrate, midazolam, ondansetron (ZOFRAN) IV, sodium chloride flush    Patient Profile   [name] is a 77 y.o. female with history of fusiform ascending aortic aneurysm, HTN, and HLD.   Admitted 07/10/17 for planned AVR and replacement of ascending aortic aneurysm. Developed acute cardiogenic shock + VF arrest. Taken back to OR am of 07/11/17 for evacuation of hematoma and placement of RVAD.   Assessment/Plan   1. Cardiogenic shock s/p cardiac surgery with R>L CHF. - Pre-op Echo 06/02/2017 LVEF 55-60%, Grade 1 DD, Mild/Mod AI with LVOT, Mid ascending aortic diameter 48.88 mm, Mild LAE, RV normal - S/P RVAD removal 07/17/2017  - Remains on milrinone 0.25 mcg + NE 7 mcg. Todays CO-OX is 72%.  CVP 12.  Continue 40 mg IV lasix twice a day.   Per CT surgery .  -2. VF arrest - Stable. No further arrythmia.  - Goal K > 4.0 and Mg > 2.0.  Supplement K 2.9  Mag 2.  3. Atrial fibrillation - Continue amio 30 mg per hour.  4. Acute respiratory failure Reintubated 1/24.  Possible trach next week.  5. S/p AVR and Ascending  aortic aneurysm replacement. - Per surgery. No change. 6. AKI Resolved 7. Anemia  Hgb  6.9   Length of Stay: [place], NP  8:27 AM Advanced Heart Failure Team  Pager [phone] (M-F; [place])  Please contact [facility] for night-coverage after hours (4p -7a ) and weekends on [email or web address]  Agree with above.   She was reintubated yesterday. Now awake on vent. Hemodynamics look good on norepi and milrinone. Wean norepi as tolerated. Remains very volume overloaded. Increase IV lasix to 40 TID. Maintaining NSR on IV amio. Will continue. Will need trach next week.   Exam Awake on vent. Will follow commands.  ETT JVP up Cor: sternal dressing ok Reg tachy Lungs rhonchi Ab soft NT Ext 2-3+ edema   Wean pressors as tolerated. Continue diuresis. Trach next week.   CRITICAL CARE Performed by: Glori Bickers  Total critical care time: 35 minutes  Critical care time was exclusive of separately billable procedures and treating other patients.  Critical care was necessary to treat or prevent imminent or life-threatening deterioration.  Critical care was time spent personally by me (independent of midlevel providers or residents) on the following activities: development of treatment plan with patient and/or surrogate as well as nursing, discussions with consultants, evaluation of patient's response to treatment, examination of patient, obtaining history from patient or surrogate, ordering and performing treatments and interventions, ordering and review of laboratory studies, ordering and review of radiographic studies, pulse oximetry and re-evaluation of patient's condition.  Glori Bickers, MD  9:32 AM

## 2017-07-22 ENCOUNTER — Inpatient Hospital Stay (HOSPITAL_COMMUNITY): Payer: Medicare Other

## 2017-07-22 DIAGNOSIS — I361 Nonrheumatic tricuspid (valve) insufficiency: Secondary | ICD-10-CM

## 2017-07-22 LAB — COMPREHENSIVE METABOLIC PANEL
ALT: 35 U/L (ref 14–54)
AST: 50 U/L — ABNORMAL HIGH (ref 15–41)
Albumin: 2.7 g/dL — ABNORMAL LOW (ref 3.5–5.0)
Alkaline Phosphatase: 190 U/L — ABNORMAL HIGH (ref 38–126)
Anion gap: 15 (ref 5–15)
BUN: 29 mg/dL — ABNORMAL HIGH (ref 6–20)
CO2: 27 mmol/L (ref 22–32)
Calcium: 6.9 mg/dL — ABNORMAL LOW (ref 8.9–10.3)
Chloride: 96 mmol/L — ABNORMAL LOW (ref 101–111)
Creatinine, Ser: 0.83 mg/dL (ref 0.44–1.00)
GFR calc Af Amer: >60 mL/min (ref >60)
GFR calc non Af Amer: >60 mL/min (ref >60)
Glucose, Bld: 134 mg/dL — ABNORMAL HIGH (ref 65–99)
Potassium: 3.2 mmol/L — ABNORMAL LOW (ref 3.5–5.1)
Sodium: 138 mmol/L (ref 135–145)
Total Bilirubin: 1.1 mg/dL (ref 0.3–1.2)
Total Protein: 5.3 g/dL — ABNORMAL LOW (ref 6.5–8.1)

## 2017-07-22 LAB — POCT I-STAT, CHEM 8
BUN: 27 mg/dL — ABNORMAL HIGH (ref 6–20)
Calcium, Ion: 0.87 mmol/L — CL (ref 1.15–1.40)
Chloride: 95 mmol/L — ABNORMAL LOW (ref 101–111)
Creatinine, Ser: 0.7 mg/dL (ref 0.44–1.00)
Glucose, Bld: 120 mg/dL — ABNORMAL HIGH (ref 65–99)
HCT: 25 % — ABNORMAL LOW (ref 36.0–46.0)
Hemoglobin: 8.5 g/dL — ABNORMAL LOW (ref 12.0–15.0)
Potassium: 3.4 mmol/L — ABNORMAL LOW (ref 3.5–5.1)
Sodium: 140 mmol/L (ref 135–145)
TCO2: 29 mmol/L (ref 22–32)

## 2017-07-22 LAB — CULTURE, RESPIRATORY W GRAM STAIN
Culture: NORMAL
Special Requests: NORMAL

## 2017-07-22 LAB — CBC
HCT: 26.4 % — ABNORMAL LOW (ref 36.0–46.0)
Hemoglobin: 8.2 g/dL — ABNORMAL LOW (ref 12.0–15.0)
MCH: 29.4 pg (ref 26.0–34.0)
MCHC: 31.1 g/dL (ref 30.0–36.0)
MCV: 94.6 fL (ref 78.0–100.0)
Platelets: 377 10*3/uL (ref 150–400)
RBC: 2.79 MIL/uL — ABNORMAL LOW (ref 3.87–5.11)
RDW: 17.5 % — ABNORMAL HIGH (ref 11.5–15.5)
WBC: 17.5 10*3/uL — ABNORMAL HIGH (ref 4.0–10.5)

## 2017-07-22 LAB — GLUCOSE, CAPILLARY
Glucose-Capillary: 116 mg/dL — ABNORMAL HIGH (ref 65–99)
Glucose-Capillary: 137 mg/dL — ABNORMAL HIGH (ref 65–99)
Glucose-Capillary: 123 mg/dL — ABNORMAL HIGH (ref 65–99)
Glucose-Capillary: 104 mg/dL — ABNORMAL HIGH (ref 65–99)
Glucose-Capillary: 129 mg/dL — ABNORMAL HIGH (ref 65–99)
Glucose-Capillary: 126 mg/dL — ABNORMAL HIGH (ref 65–99)

## 2017-07-22 LAB — BLOOD GAS, ARTERIAL
Acid-Base Excess: 8.1 mmol/L — ABNORMAL HIGH (ref 0.0–2.0)
Bicarbonate: 31.8 mmol/L — ABNORMAL HIGH (ref 20.0–28.0)
FIO2: 40
MECHVT: 400 mL
O2 Saturation: 96.1 %
PEEP: 8 cmH2O
Patient temperature: 98.6
RATE: 12 resp/min
pCO2 arterial: 42.4 mmHg (ref 32.0–48.0)
pH, Arterial: 7.488 — ABNORMAL HIGH (ref 7.350–7.450)
pO2, Arterial: 76.5 mmHg — ABNORMAL LOW (ref 83.0–108.0)

## 2017-07-22 LAB — COOXEMETRY PANEL
Carboxyhemoglobin: 1.3 % (ref 0.5–1.5)
Methemoglobin: 1.4 % (ref 0.0–1.5)
O2 Saturation: 64.2 %
Total hemoglobin: 11.6 g/dL — ABNORMAL LOW (ref 12.0–16.0)

## 2017-07-22 LAB — ECHOCARDIOGRAM COMPLETE
Height: 65 in
Weight: 3135.82 oz

## 2017-07-22 MED ORDER — ALBUMIN HUMAN 25 % IV SOLN
12.5000 g | Freq: Four times a day (QID) | INTRAVENOUS | Status: AC
  Administered 2017-07-22 – 2017-07-23 (×3): 12.5 g via INTRAVENOUS
  Filled 2017-07-22 (×3): qty 50

## 2017-07-22 MED ORDER — SORBITOL 70 % PO SOLN
30.0000 mL | Freq: Once | ORAL | Status: AC
  Administered 2017-07-22: 30 mL via ORAL
  Filled 2017-07-22: qty 30

## 2017-07-22 MED ORDER — POTASSIUM CHLORIDE 10 MEQ/50ML IV SOLN
10.0000 meq | INTRAVENOUS | Status: AC | PRN
  Administered 2017-07-22 (×3): 10 meq via INTRAVENOUS
  Filled 2017-07-22 (×3): qty 50

## 2017-07-22 MED ORDER — POTASSIUM CHLORIDE 20 MEQ PO PACK
40.0000 meq | PACK | Freq: Two times a day (BID) | ORAL | Status: DC
  Administered 2017-07-22 – 2017-07-24 (×6): 40 meq
  Filled 2017-07-22 (×7): qty 2

## 2017-07-22 MED ORDER — DOCUSATE SODIUM 50 MG/5ML PO LIQD
200.0000 mg | Freq: Every day | ORAL | Status: DC
  Administered 2017-07-22 – 2017-07-26 (×5): 200 mg
  Filled 2017-07-22 (×5): qty 20

## 2017-07-22 MED ORDER — FUROSEMIDE 10 MG/ML IJ SOLN
80.0000 mg | Freq: Two times a day (BID) | INTRAMUSCULAR | Status: DC
  Administered 2017-07-22 – 2017-07-30 (×17): 80 mg via INTRAVENOUS
  Filled 2017-07-22 (×17): qty 8

## 2017-07-22 MED ORDER — POTASSIUM CHLORIDE 10 MEQ/50ML IV SOLN
10.0000 meq | INTRAVENOUS | Status: AC
  Administered 2017-07-22 (×2): 10 meq via INTRAVENOUS

## 2017-07-22 MED ORDER — POTASSIUM CHLORIDE 10 MEQ/50ML IV SOLN
INTRAVENOUS | Status: AC
  Administered 2017-07-22: 10 meq
  Filled 2017-07-22: qty 150

## 2017-07-22 MED ORDER — QUETIAPINE FUMARATE 100 MG PO TABS
100.0000 mg | ORAL_TABLET | Freq: Two times a day (BID) | ORAL | Status: DC
  Administered 2017-07-22 – 2017-07-23 (×4): 100 mg
  Filled 2017-07-22 (×4): qty 1

## 2017-07-22 MED ORDER — QUETIAPINE FUMARATE 100 MG PO TABS: 100.0000 mg | ORAL_TABLET | Freq: Two times a day (BID) | ORAL | Status: DC

## 2017-07-22 NOTE — Plan of Care (Signed)
  Progressing Cardiac: Hemodynamic stability will improve 07/22/2017 2307 - Progressing by Burman Blacksmith, RN Note Weaning vasoactive gtts Clinical Measurements: Postoperative complications will be avoided or minimized 07/22/2017 2307 - Progressing by Burman Blacksmith, RN Respiratory: Respiratory status will improve 07/22/2017 2307 - Progressing by Burman Blacksmith, RN Skin Integrity: Wound healing without signs and symptoms of infection 07/22/2017 2307 - Progressing by Burman Blacksmith, RN Risk for impaired skin integrity will decrease 07/22/2017 2307 - Progressing by Burman Blacksmith, RN Urinary Elimination: Ability to achieve and maintain adequate renal perfusion and functioning will improve 07/22/2017 2307 - Progressing by Burman Blacksmith, RN

## 2017-07-22 NOTE — Progress Notes (Signed)
Pulled ET tube back 2 cm to 21 at the lip.  Tolerated well CP 30

## 2017-07-22 NOTE — Progress Notes (Signed)
  Echocardiogram 2D Echocardiogram has been performed.  Retina Bernardy T Laquisha Northcraft 07/22/2017, 10:52 AM

## 2017-07-22 NOTE — Progress Notes (Signed)
Patient ID: [name], female   DOB: 09/16/1940, 77 y.o.   MRN: [id number]     Advanced Heart Failure Rounding Note  Primary Cardiologist: Dr. Hochrein (2014) HF: (New) Dr. Bensimhon   Subjective:    Events - 07/10/17 S/p AVR and ascending AA replacement  - 07/10/17 VF Arrest in evening with shock x 1 - 07/11/17 RVAD placement  - 07/17/2017 RVAD removed with chest closed -1/23 Extubated. Went in A fib. Amio started.  -1/24 Reintubated.   Today she is awake and follows commands on vent.  Milrinone 0.25, norepinephrine 22.  CVP 18, co-ox 64%. I/Os positive. SBP currently in 130s on arterial line.   Echo 06/02/2017 LVEF 55-60%, Grade 1 DD, Mild/Mod AI with LVOT, Mid ascending aortic diameter 48.88 mm, Mild LAE, RV normal  Objective:   Weight Range: 195 lb 15.8 oz (88.9 kg) Body mass index is 32.61 kg/m.   Vital Signs:   Temp:  [98.4 F (36.9 C)-99.4 F (37.4 C)] 99 F (37.2 C) (01/26 0800) Pulse Rate:  [80-138] 94 (01/26 0500) Resp:  [8-55] 17 (01/26 0500) BP: (94-143)/(43-75) 110/53 (01/26 0500) SpO2:  [93 %-99 %] 97 % (01/26 0727) Arterial Line BP: (75-147)/(41-65) 112/47 (01/26 0500) FiO2 (%):  [40 %-50 %] 40 % (01/26 0727) Weight:  [195 lb 15.8 oz (88.9 kg)] 195 lb 15.8 oz (88.9 kg) (01/26 0406) Last BM Date: (ducolax supp given)  Weight change: Filed Weights   07/20/17 0500 07/21/17 0500 07/22/17 0406  Weight: 195 lb 5.2 oz (88.6 kg) 197 lb 12 oz (89.7 kg) 195 lb 15.8 oz (88.9 kg)    Intake/Output:   Intake/Output Summary (Last 24 hours) at 07/22/2017 0813 Last data filed at 07/22/2017 0700 Gross per 24 hour  Intake 3147.67 ml  Output 2785 ml  Net 362.67 ml    Physical Exam  CVP 18 General: Intubated, awake. Neck: JVP 16+ cm, no thyromegaly or thyroid nodule.  Lungs: Dependent crackles CV: Nondisplaced PMI.  Heart mildly tachy, regular S1/S2, no S3/S4, 2/6 early SEM RUSB.  1+ ankle edema.   Abdomen: Soft, nontender, no hepatosplenomegaly, no distention.    Skin: Intact without lesions or rashes.  Neurologic: Awake on vent, follows commands.   Extremities: No clubbing or cyanosis.  HEENT: Normal.   Telemetry  NSR 100s personally reviewed.    EKG    No new tracings.    Labs    CBC Recent Labs    07/21/17 0300 07/21/17 1556 07/22/17 0348  WBC 23.1*  --  17.5*  HGB 8.9* 8.8* 8.2*  HCT 27.8* 26.0* 26.4*  MCV 93.0  --  94.6  PLT 402*  --  377   Basic Metabolic Panel Recent Labs    07/21/17 0300 07/21/17 1541  07/21/17 1957 07/22/17 0348  NA 137  --    < > 141 138  K 2.9*  --    < > 3.7 3.2*  CL 94*  --    < > 96* 96*  CO2 28  --   --  31 27  GLUCOSE 182*  --    < > 122* 134*  BUN 28*  --    < > 28* 29*  CREATININE 0.93  --    < > 0.91 0.83  CALCIUM 6.5*  --   --  7.4* 6.9*  MG 2.0 1.8  --   --   --   PHOS 3.7 3.4  --   --   --    < > =   values in this interval not displayed.   Liver Function Tests Recent Labs    07/21/17 0300 07/22/17 0348  AST 52* 50*  ALT 33 35  ALKPHOS 188* 190*  BILITOT 1.5* 1.1  PROT 5.0* 5.3*  ALBUMIN 2.5* 2.7*   No results for input(s): LIPASE, AMYLASE in the last 72 hours. Cardiac Enzymes No results for input(s): CKTOTAL, CKMB, CKMBINDEX, TROPONINI in the last 72 hours.  BNP: BNP (last 3 results) No results for input(s): BNP in the last 8760 hours.  ProBNP (last 3 results) No results for input(s): PROBNP in the last 8760 hours.   D-Dimer No results for input(s): DDIMER in the last 72 hours. Hemoglobin A1C No results for input(s): HGBA1C in the last 72 hours. Fasting Lipid Panel Recent Labs    07/20/17 1510  TRIG 134   Thyroid Function Tests No results for input(s): TSH, T4TOTAL, T3FREE, THYROIDAB in the last 72 hours.  Invalid input(s): FREET3  Other results:   Imaging    Dg Chest Port 1 View  Result Date: 07/22/2017 CLINICAL DATA:  History of right ventricular assist device. EXAM: PORTABLE CHEST 1 VIEW COMPARISON:  07/21/2017.  07/20/2017. FINDINGS:  Extensive artifact overlies the chest. The left thoracostomy tube has been removed. Chest tube tract remains visible. No pneumothorax. Endotracheal tube tip is at the carina. Nasogastric tube enters the abdomen. Right chest tube remains in place. No pneumothorax on the right. Right arm PICC tip at the distal SVC above the right atrium. Left subclavian central line tip projects over the SVC at the azygos level. Configuration on this examination could not exclude arterial positioning, but on previous studies the line was clearly venous. Assuming this is not been replaced, this relates to the projection. There is worsening atelectasis in both lower lobes. IMPRESSION: Left chest tube removed. Chest tube tract. No pneumothorax. Worsening atelectasis in both lower lobes. Endotracheal tube tip at the carina. Electronically Signed   By: Mark  Shogry M.D.   On: 07/22/2017 06:51    Medications:    Scheduled Medications: . acetaminophen  1,000 mg Oral Q6H   Or  . acetaminophen (TYLENOL) oral liquid 160 mg/5 mL  1,000 mg Per Tube Q6H  . acetaminophen (TYLENOL) oral liquid 160 mg/5 mL  650 mg Per Tube Once   Or  . acetaminophen  650 mg Rectal Once  . arformoterol  15 mcg Nebulization BID  . aspirin EC  325 mg Oral Daily   Or  . aspirin  324 mg Per Tube Daily  . bisacodyl  10 mg Oral Daily   Or  . bisacodyl  10 mg Rectal Daily  . budesonide (PULMICORT) nebulizer solution  0.5 mg Nebulization BID  . chlorhexidine gluconate (MEDLINE KIT)  15 mL Mouth Rinse BID  . Chlorhexidine Gluconate Cloth  6 each Topical Daily  . docusate sodium  200 mg Oral Daily  . enoxaparin (LOVENOX) injection  40 mg Subcutaneous Q24H  . feeding supplement (PRO-STAT SUGAR FREE 64)  30 mL Per Tube BID  . feeding supplement (VITAL AF 1.2 CAL)  1,000 mL Per Tube Q24H  . furosemide  40 mg Intravenous TID PC  . insulin aspart  0-24 Units Subcutaneous Q4H  . insulin detemir  10 Units Subcutaneous BID  . latanoprost  1 drop Both Eyes  QHS  . levalbuterol  1.25 mg Nebulization Q6H  . mouth rinse  15 mL Mouth Rinse 10 times per day  . naphazoline-glycerin  1-2 drop Both Eyes BID  . pantoprazole (  PROTONIX) IV  40 mg Intravenous Q12H  . QUEtiapine  50 mg Per Tube QHS  . sodium chloride flush  10-40 mL Intracatheter Q12H  . sodium chloride flush  10-40 mL Intracatheter Q12H  . sodium chloride flush  3 mL Intravenous Q12H  . venlafaxine XR  75 mg Oral Daily    Infusions: . sodium chloride Stopped (07/18/17 1730)  . sodium chloride 250 mL (07/21/17 0700)  . sodium chloride 1 mL/hr at 07/22/17 0600  . amiodarone 30 mg/hr (07/22/17 0600)  . calcium chloride    . cefTAZidime (FORTAZ)  IV Stopped (07/22/17 0110)  . EPINEPHrine 4 mg in dextrose 5% 250 mL infusion (16 mcg/mL) Stopped (07/19/17 1705)  . fentaNYL infusion INTRAVENOUS 150 mcg/hr (07/22/17 8099)  . lactated ringers Stopped (07/17/17 1200)  . milrinone 0.25 mcg/kg/min (07/22/17 0600)  . norepinephrine (LEVOPHED) Adult infusion 26 mcg/min (07/22/17 0615)  . potassium chloride 10 mEq (07/22/17 0640)  . propofol (DIPRIVAN) infusion 35 mcg/kg/min (07/22/17 0614)    PRN Medications: sodium chloride, fentaNYL, fentaNYL (SUBLIMAZE) injection, metoprolol tartrate, midazolam, ondansetron (ZOFRAN) IV, potassium chloride, sodium chloride flush    Patient Profile   [name] is a 77 y.o. female with history of fusiform ascending aortic aneurysm, HTN, and HLD.   Admitted 07/10/17 for planned AVR and replacement of ascending aortic aneurysm. Developed acute cardiogenic shock + VF arrest. Taken back to OR am of 07/11/17 for evacuation of hematoma and placement of RVAD.   Assessment/Plan   1. Cardiogenic shock s/p cardiac surgery with R>L CHF:  Pre-op Echo 06/02/2017 LVEF 55-60%, Grade 1 DD, Mild/Mod AI with LVOT, Mid ascending aortic diameter 48.88 mm, Mild LAE, RV normal.  Post-op shock, RVAD placed now s/p RVAD removal 07/17/2017.  Co-ox 64% today with CVP 18.  -  Remains on milrinone 0.25 mcg + NE 22 mcg. Continue current milrinone, can wean down norepinephrine (SBP currently in 130s).  - Change Lasix to 80 mg IV bid, need better diuresis.  - Would repeat echo with ongoing shock, has not had one post-op.  2. VF arrest: Stable, no further arrhythmia. Currently on amiodarone gtt.  - Supplement K.  3. Atrial fibrillation: NSR this morning.  - Continue amio 30 mg per hour.  - Currently only getting DVT prophylaxis. 4. Acute respiratory failure: Reintubated 1/24.  Tracheostomy planned next week.  Suspect HCAP. - Continues on ceftazidime per CCM.  5. S/p AVR and Ascending aortic aneurysm replacement. - Post-op echo. 6. AKI: Resolved 7. Anemia: Stable hgb.   Length of Stay: 12  Loralie Champagne, MD  8:13 AM Advanced Heart Failure Team Pager [phone] (M-F; 7a - 4p)  Please contact [facility] for night-coverage after hours (4p -7a ) and weekends on [email or web address]

## 2017-07-22 NOTE — Progress Notes (Signed)
.. ..  Name: [name] MRN: [id number] DOB: Dec 21, 1940    ADMISSION DATE:  07/10/2017  CONSULTATION DATE:  07/12/17  REFERRING MD :  Nils Pyle MD   BRIEF:   77 year old female with past medical history significant for fusiform ascending aortic aneurysm, hypertension, hyperlipidemia, status post AVR and replacement of ascending aortic aneurysm.  On 07/10/2017 postprocedure patient developed acute cardiogenic shock had an episode of V. fib arrest was taken back to the OR on 07/11/2017 for evacuation of hematoma and placement of RVAD.  PCCM consulted for ventilator management.   Events - 07/10/17 Admit - 07/10/17 S/p AVR and ascending AA replacement  - 07/10/17 VF Arrest in evening with shock x 1 - 07/11/17 to OR for evacuation of hematoma and RVAD placement  - 07/12/17 PCCM consult 07/15/2017 Lasix per cardiology CVP of 5 1/22 heavily diuresed  1/23 ready for extubation 1/24: reintubated  STUDIES: Echo12/12/2016 LVEF 55-60%, Grade 1 DD, Mild/Mod AI with LVOT, Mid ascending aortic diameter 48.88 mm, Mild LAE, RV normal  Culture data 1/24 sputum: few wbc, rare GPC pairs and clusters>>>  abx Ceftaz1/17>>>1.20 Cefuroxime 1/21>>1/23 ceftaz 1/23>>>  SUBJECTIVE/OVERNIGHT/INTERVAL HX Afebrile Intubated & remains sedated on propofol/fent gtt On low dose levo gtt  VITAL SIGNS: Temp:  [98.4 F (36.9 C)-99.4 F (37.4 C)] 99 F (37.2 C) (01/26 0800) Pulse Rate:  [80-138] 98 (01/26 0900) Resp:  [8-55] 13 (01/26 0900) BP: (94-143)/(43-75) 118/51 (01/26 0900) SpO2:  [93 %-99 %] 96 % (01/26 0900) Arterial Line BP: (75-147)/(41-65) 120/53 (01/26 0900) FiO2 (%):  [40 %-50 %] 40 % (01/26 0800) Weight:  [195 lb 15.8 oz (88.9 kg)] 195 lb 15.8 oz (88.9 kg) (01/26 0406)   Filed Weights   07/20/17 0500 07/21/17 0500 07/22/17 0406  Weight: 195 lb 5.2 oz (88.6 kg) 197 lb 12 oz (89.7 kg) 195 lb 15.8 oz (88.9 kg)    Intake/Output Summary (Last 24 hours) at 07/22/2017 1003 Last data filed at  07/22/2017 0900 Gross per 24 hour  Intake 3523.83 ml  Output 2605 ml  Net 918.83 ml     PHYSICAL EXAMINATION: General: elderly woman, sedated on ventilator.  No acute distress. HEENT: Normocephalic atraumatic orally intubated Pulmonary: decreased BS BL, no accessory use on ventilator Thorax: Anterior chest tubes unremarkable.  Sternal dressing and chest sutures intact. Cardiac: Regular rate and rhythm Abdomen: Soft nontender Extremities: Trace edema, brisk cap refill, warm, strong pulses. GU: Clear yellow Neuro: Alert, interactive but restless, moves all extremities  PULMONARY Recent Labs  Lab 07/19/17 1313 07/19/17 1425 07/19/17 1849  07/20/17 0520 07/20/17 1029 07/20/17 1215 07/21/17 0331 07/21/17 0348 07/21/17 1556 07/22/17 0334 07/22/17 0359  PHART 7.476*  --  7.456*  --  7.539* 7.606* 7.565* 7.467*  --   --  7.488*  --   PCO2ART 40.6  --  42.4  --  38.5 33.9 35.5 46.6  --   --  42.4  --   PO2ART 67.0*  --  79.0*  --  55.3* 58.0* 66.4* 98.9  --   --  76.5*  --   HCO3 29.9*  --  29.9*  --  32.7* 33.9* 32.4* 33.3*  --   --  31.8*  --   TCO2 31 29 31   --   --  35*  --   --   --  32  --   --   O2SAT 94.0  --  96.0   < > 89.5 94.0 94.6 97.5 71.6  --  96.1 64.2   < > =  values in this interval not displayed.    CBC Recent Labs  Lab 07/20/17 0500 07/21/17 0300 07/21/17 1556 07/22/17 0348  HGB 10.4* 8.9* 8.8* 8.2*  HCT 32.3* 27.8* 26.0* 26.4*  WBC 13.8* 23.1*  --  17.5*  PLT 308 402*  --  377    COAGULATION Recent Labs  Lab 07/17/17 1144  INR 1.20    CARDIAC  No results for input(s): TROPONINI in the last 168 hours. No results for input(s): PROBNP in the last 168 hours.   CHEMISTRY Recent Labs  Lab 07/17/17 0346  07/20/17 0500 07/20/17 0902 07/20/17 1510 07/21/17 0300 07/21/17 1541 07/21/17 1556 07/21/17 1957 07/22/17 0348  NA 138   < > 138  --  139 137  --  138 141 138  K 4.2   < > 2.8*  --  3.3* 2.9*  --  3.6 3.7 3.2*  CL 100*   < > 93*  --   94* 94*  --  93* 96* 96*  CO2 28   < > 28  --  29 28  --   --  31 27  GLUCOSE 126*   < > 101*  --  85 182*  --  159* 122* 134*  BUN 8   < > 18  --  21* 28*  --  27* 28* 29*  CREATININE 0.57   < > 0.74  --  0.86 0.93  --  0.80 0.91 0.83  CALCIUM 8.0*   < > 6.8*  --  6.5* 6.5*  --   --  7.4* 6.9*  MG 1.3*   < > 1.8 1.7 1.6* 2.0 1.8  --   --   --   PHOS 3.8  --   --  3.9 3.9 3.7 3.4  --   --   --    < > = values in this interval not displayed.   Estimated Creatinine Clearance: 64.5 mL/min (by C-G formula based on SCr of 0.83 mg/dL).   LIVER Recent Labs  Lab 07/17/17 1144 07/19/17 1418 07/20/17 0500 07/21/17 0300 07/22/17 0348  AST  --  51* 52* 52* 50*  ALT  --  35 37 33 35  ALKPHOS  --  172* 205* 188* 190*  BILITOT  --  1.3* 2.0* 1.5* 1.1  PROT  --  5.2* 5.4* 5.0* 5.3*  ALBUMIN  --  2.7* 2.7* 2.5* 2.7*  INR 1.20  --   --   --   --     INFECTIOUS Recent Labs  Lab 07/16/17 0233  PROCALCITON 0.50    ENDOCRINE CBG (last 3)  Recent Labs    07/21/17 1950 07/22/17 0338 07/22/17 0746  GLUCAP 116* 137* 123*     IMAGING x48h  - image(s) personally visualized  -   highlighted in bold Dg Chest Port 1 View  Result Date: 07/22/2017 CLINICAL DATA:  History of right ventricular assist device. EXAM: PORTABLE CHEST 1 VIEW COMPARISON:  07/21/2017.  07/20/2017. FINDINGS: Extensive artifact overlies the chest. The left thoracostomy tube has been removed. Chest tube tract remains visible. No pneumothorax. Endotracheal tube tip is at the carina. Nasogastric tube enters the abdomen. Right chest tube remains in place. No pneumothorax on the right. Right arm PICC tip at the distal SVC above the right atrium. Left subclavian central line tip projects over the SVC at the azygos level. Configuration on this examination could not exclude arterial positioning, but on previous studies the line was clearly venous. Assuming this is not  been replaced, this relates to the projection. There is worsening  atelectasis in both lower lobes. IMPRESSION: Left chest tube removed. Chest tube tract. No pneumothorax. Worsening atelectasis in both lower lobes. Endotracheal tube tip at the carina. Electronically Signed   By: Nelson Chimes M.D.   On: 07/22/2017 06:51   Dg Chest Port 1 View  Result Date: 07/21/2017 CLINICAL DATA:  Hypoxia EXAM: PORTABLE CHEST 1 VIEW COMPARISON:  July 20, 2017 FINDINGS: Endotracheal tube tip is 2.6 cm above the carina. Nasogastric tube tip and side port are in the stomach. There is a chest tube on the right. Right-sided central catheter tip is in the superior vena cava. Left-sided central catheter tip is also in the superior vena cava. Temporary pacemaker wires are attached to the right heart. No pneumothorax. There is patchy bibasilar atelectasis. There is no frank edema or consolidation. Heart is upper normal in size with pulmonary vascularity within normal limits. Postoperative changes noted in the proximal left humerus. There are surgical clips in the right axillary region. There are skin staples over the right axillary region. There are also surgical clips in the cervicothoracic junction region. Patient is status post aortic valve replacement. IMPRESSION: Tube and catheter positions as described without pneumothorax. Bibasilar atelectasis. No consolidation. Stable cardiac silhouette. Electronically Signed   By: Lowella Grip III M.D.   On: 07/21/2017 08:09   Dg Abd Portable 1v  Result Date: 07/20/2017 CLINICAL DATA:  Encounter for orogastric tube placement EXAM: PORTABLE ABDOMEN - 1 VIEW COMPARISON:  None. FINDINGS: Imaging of the abdomen centered over the left upper quadrant was performed for assessment of gastric tube placement. The tip and side port of a gastric tube are seen below the left hemidiaphragm in the expected location of the stomach, the tip near the expected location of the gastric cardia. Chest tube is also seen projecting up to the left mid thorax. Median  sternotomy sutures are in place. Plate and screw fixation hardware is partially included involving the proximal left humerus. IMPRESSION: Gastric tube with tip and side port are in the expected location of the stomach. Left-sided chest tube projects up to the mid thorax. Electronically Signed   By: Ashley Royalty M.D.   On: 07/20/2017 21:14     ASSESSMENT / PLAN: 77 year old female with PMHx significant for fusiform ascending aortic aneurysm, hypertension, hyperlipidemia, status post AVR and replacement of ascending aortic aneurysm.  On 07/10/2017 postprocedure patient developed acute cardiogenic shock had an episode of V. fib arrest was taken back to the OR on 07/11/2017 for evacuation of hematoma and placement of RVAD.  RVAD now removed, weaning pressors.   ASSESSMENT / PLAN:  Acute respiratory failure initially in setting of cardiogenic shock, as of 1/24 mechanism seems to be atelectasis, poor cough mechanics, deconditioning +/- HF: Required reintubation Chest x-ray personally reviewed- BB atx: ETT at carina Plan Drop PEEP to 5 SBTs Pull ETT back 2 cm Plan for early trach (giving time for sternum to heal) vs another trial of extubation  Cardiogenic shock s/p removal of RVAD 1/21; sternum now closed Atrial fibrillation per tele Medication related hypotension.  Back on levophed since started on propofol Still on milrinone and amio -->may require pressors after intubation  Plan Continue levo gtt and inotropic support ct amiodarone Continue diuresis Above directed by heart failure and cardiothoracic team  HCAP  - WBC coming down Plan F/u resp cx Day 4 ceftaz but day 9 total abx   Acute encephalopathy Plan Continue PAD protocol RASS  goal 0 to -1  Hypokalemia and hypomagnesemia -->exacerbated by diuresis  Plan Replace and recheck  anemia of critical illness; no evidence of bleeding Thrombocytopenia -resolved  Plan And CBC Transfuse for Hb 8 or lower  Hyperglycemia   Plan Sliding scale insulin  FAMILY  - Updates no family at bedside  - Inter-disciplinary family meet or Palliative Care meeting due by: done Current LOS is LOS 12 days  DISPO  DVT prophylaxis: lmwh SUP: PPI  Diet: tubefeeds Disposition : ICU   The patient is critically ill with multiple organ systems failure and requires high complexity decision making for assessment and support, frequent evaluation and titration of therapies, application of advanced monitoring technologies and extensive interpretation of multiple databases. Critical Care Time devoted to patient care services described in this note independent of APP/resident  time is 31 minutes.    Leanna Sato Elsworth Soho MD

## 2017-07-22 NOTE — Progress Notes (Signed)
CTS  Resting on vent nsr TF at goal Excellent duresis

## 2017-07-22 NOTE — Progress Notes (Signed)
5 Days Post-Op Procedure(s) (LRB): REMOVAL OF RVAD WITH PUMP STANDBY (N/A) TRANSESOPHAGEAL ECHOCARDIOGRAM (TEE) (N/A) Subjective: Remained stable on ventilator, sedation requirement and pressor or requirement now improved Bedside echo shows improvement in RV function with stable mild-moderate TR, good LV function Needs more diuresis Plan percutaneous tracheostomy next week for airway protection and to promote mobilization and safe liberation from ventilator Sinus rhythm on IV amiodarone Tube feeds at goal without bowel movement yet Surgical incisions, RVAD cannulation sites are healing Objective: Vital signs in last 24 hours: Temp:  [98.4 F (36.9 C)-99.4 F (37.4 C)] 99 F (37.2 C) (01/26 0800) Pulse Rate:  [80-138] 103 (01/26 1100) Cardiac Rhythm: Normal sinus rhythm (01/26 0800) Resp:  [8-55] 29 (01/26 1100) BP: (94-143)/(43-75) 95/55 (01/26 1100) SpO2:  [93 %-99 %] 95 % (01/26 1100) Arterial Line BP: (91-147)/(41-65) 106/49 (01/26 1100) FiO2 (%):  [40 %-50 %] 40 % (01/26 0800) Weight:  [195 lb 15.8 oz (88.9 kg)] 195 lb 15.8 oz (88.9 kg) (01/26 0406)  Hemodynamic parameters for last 24 hours: CVP:  [7 mmHg-27 mmHg] 18 mmHg  Intake/Output from previous day: 01/25 0701 - 01/26 0700 In: 3569.1 [I.V.:1534.1; NG/GT:1235; IV Piggyback:800] Out: 2985 [Urine:2875; Chest Tube:110] Intake/Output this shift: Total I/O In: 778.1 [I.V.:258.1; NG/GT:420; IV Piggyback:100] Out: 820 [Urine:800; Chest Tube:20]       Exam    General- alert and comfortable    Neck- no JVD, no cervical adenopathy palpable, no carotid bruit   Lungs- clear without rales, wheezes   Cor- regular rate and rhythm, no murmur , gallop   Abdomen- soft, non-tender   Extremities - warm, non-tender, minimal edema   Neuro- oriented, appropriate, no focal weakness   Lab Results: Recent Labs    07/21/17 0300 07/21/17 1556 07/22/17 0348  WBC 23.1*  --  17.5*  HGB 8.9* 8.8* 8.2*  HCT 27.8* 26.0* 26.4*  PLT  402*  --  377   BMET:  Recent Labs    07/21/17 1957 07/22/17 0348  NA 141 138  K 3.7 3.2*  CL 96* 96*  CO2 31 27  GLUCOSE 122* 134*  BUN 28* 29*  CREATININE 0.91 0.83  CALCIUM 7.4* 6.9*    PT/INR: No results for input(s): LABPROT, INR in the last 72 hours. ABG    Component Value Date/Time   PHART 7.488 (H) 07/22/2017 0334   HCO3 31.8 (H) 07/22/2017 0334   TCO2 32 07/21/2017 1556   ACIDBASEDEF 1.0 07/18/2017 0404   O2SAT 64.2 07/22/2017 0359   CBG (last 3)  Recent Labs    07/21/17 1950 07/22/17 0338 07/22/17 0746  GLUCAP 116* 137* 123*    Assessment/Plan: S/P Procedure(s) (LRB): REMOVAL OF RVAD WITH PUMP STANDBY (N/A) TRANSESOPHAGEAL ECHOCARDIOGRAM (TEE) (N/A) Continue IV Fortaz for airway secretions mixed flora on culture Hold tube feeds starting Sunday midnight for possible percutaneous tracheostomy Try to minimize IV sedation and vasopressors to meet goal to allow adequate synchronization with ventilator Echocardiogram today shows improved RV function   LOS: 12 days    [name] 07/22/2017

## 2017-07-23 ENCOUNTER — Inpatient Hospital Stay (HOSPITAL_COMMUNITY): Payer: Medicare Other

## 2017-07-23 DIAGNOSIS — J189 Pneumonia, unspecified organism: Secondary | ICD-10-CM

## 2017-07-23 LAB — POCT I-STAT 3, ART BLOOD GAS (G3+)
Acid-Base Excess: 8 mmol/L — ABNORMAL HIGH (ref 0.0–2.0)
Bicarbonate: 32.1 mmol/L — ABNORMAL HIGH (ref 20.0–28.0)
O2 Saturation: 96 %
Patient temperature: 98.6
TCO2: 33 mmol/L — ABNORMAL HIGH (ref 22–32)
pCO2 arterial: 43 mmHg (ref 32.0–48.0)
pH, Arterial: 7.48 — ABNORMAL HIGH (ref 7.350–7.450)
pO2, Arterial: 75 mmHg — ABNORMAL LOW (ref 83.0–108.0)

## 2017-07-23 LAB — PREPARE RBC (CROSSMATCH)

## 2017-07-23 LAB — CBC
HCT: 24.6 % — ABNORMAL LOW (ref 36.0–46.0)
Hemoglobin: 7.6 g/dL — ABNORMAL LOW (ref 12.0–15.0)
MCH: 29.5 pg (ref 26.0–34.0)
MCHC: 30.9 g/dL (ref 30.0–36.0)
MCV: 95.3 fL (ref 78.0–100.0)
Platelets: 360 10*3/uL (ref 150–400)
RBC: 2.58 MIL/uL — ABNORMAL LOW (ref 3.87–5.11)
RDW: 17.9 % — ABNORMAL HIGH (ref 11.5–15.5)
WBC: 13.8 10*3/uL — ABNORMAL HIGH (ref 4.0–10.5)

## 2017-07-23 LAB — COMPREHENSIVE METABOLIC PANEL
ALT: 37 U/L (ref 14–54)
AST: 55 U/L — ABNORMAL HIGH (ref 15–41)
Albumin: 2.9 g/dL — ABNORMAL LOW (ref 3.5–5.0)
Alkaline Phosphatase: 197 U/L — ABNORMAL HIGH (ref 38–126)
Anion gap: 14 (ref 5–15)
BUN: 29 mg/dL — ABNORMAL HIGH (ref 6–20)
CO2: 28 mmol/L (ref 22–32)
Calcium: 6.7 mg/dL — ABNORMAL LOW (ref 8.9–10.3)
Chloride: 97 mmol/L — ABNORMAL LOW (ref 101–111)
Creatinine, Ser: 0.77 mg/dL (ref 0.44–1.00)
GFR calc Af Amer: >60 mL/min (ref >60)
GFR calc non Af Amer: >60 mL/min (ref >60)
Glucose, Bld: 131 mg/dL — ABNORMAL HIGH (ref 65–99)
Potassium: 3.4 mmol/L — ABNORMAL LOW (ref 3.5–5.1)
Sodium: 139 mmol/L (ref 135–145)
Total Bilirubin: 1.1 mg/dL (ref 0.3–1.2)
Total Protein: 5.5 g/dL — ABNORMAL LOW (ref 6.5–8.1)

## 2017-07-23 LAB — TRIGLYCERIDES: Triglycerides: 530 mg/dL — ABNORMAL HIGH (ref <150)

## 2017-07-23 LAB — GLUCOSE, CAPILLARY
Glucose-Capillary: 117 mg/dL — ABNORMAL HIGH (ref 65–99)
Glucose-Capillary: 120 mg/dL — ABNORMAL HIGH (ref 65–99)
Glucose-Capillary: 119 mg/dL — ABNORMAL HIGH (ref 65–99)
Glucose-Capillary: 118 mg/dL — ABNORMAL HIGH (ref 65–99)
Glucose-Capillary: 123 mg/dL — ABNORMAL HIGH (ref 65–99)
Glucose-Capillary: 156 mg/dL — ABNORMAL HIGH (ref 65–99)
Glucose-Capillary: 103 mg/dL — ABNORMAL HIGH (ref 65–99)

## 2017-07-23 LAB — COOXEMETRY PANEL
Carboxyhemoglobin: 1.8 % — ABNORMAL HIGH (ref 0.5–1.5)
Methemoglobin: 1.3 % (ref 0.0–1.5)
O2 Saturation: 73 %
Total hemoglobin: 7.2 g/dL — ABNORMAL LOW (ref 12.0–16.0)

## 2017-07-23 LAB — POCT I-STAT, CHEM 8
BUN: 26 mg/dL — ABNORMAL HIGH (ref 6–20)
Calcium, Ion: 0.81 mmol/L — CL (ref 1.15–1.40)
Chloride: 94 mmol/L — ABNORMAL LOW (ref 101–111)
Creatinine, Ser: 0.7 mg/dL (ref 0.44–1.00)
Glucose, Bld: 141 mg/dL — ABNORMAL HIGH (ref 65–99)
HCT: 26 % — ABNORMAL LOW (ref 36.0–46.0)
Hemoglobin: 8.8 g/dL — ABNORMAL LOW (ref 12.0–15.0)
Potassium: 3.5 mmol/L (ref 3.5–5.1)
Sodium: 139 mmol/L (ref 135–145)
TCO2: 32 mmol/L (ref 22–32)

## 2017-07-23 LAB — HEMOGLOBIN AND HEMATOCRIT, BLOOD
HCT: 26 % — ABNORMAL LOW (ref 36.0–46.0)
Hemoglobin: 8.3 g/dL — ABNORMAL LOW (ref 12.0–15.0)

## 2017-07-23 LAB — MAGNESIUM: Magnesium: 1.7 mg/dL (ref 1.7–2.4)

## 2017-07-23 MED ORDER — POTASSIUM CHLORIDE 10 MEQ/50ML IV SOLN
10.0000 meq | INTRAVENOUS | Status: AC
  Administered 2017-07-23 (×3): 10 meq via INTRAVENOUS
  Filled 2017-07-23 (×3): qty 50

## 2017-07-23 MED ORDER — MAGNESIUM SULFATE 2 GM/50ML IV SOLN
2.0000 g | Freq: Once | INTRAVENOUS | Status: AC
  Administered 2017-07-23: 2 g via INTRAVENOUS
  Filled 2017-07-23: qty 50

## 2017-07-23 MED ORDER — DEXMEDETOMIDINE HCL IN NACL 400 MCG/100ML IV SOLN
0.4000 ug/kg/h | INTRAVENOUS | Status: DC
  Administered 2017-07-23: 0.9 ug/kg/h via INTRAVENOUS
  Filled 2017-07-23 (×2): qty 100

## 2017-07-23 MED ORDER — PROPOFOL 1000 MG/100ML IV EMUL
5.0000 ug/kg/min | INTRAVENOUS | Status: DC
  Administered 2017-07-23: 10 ug/kg/min via INTRAVENOUS
  Administered 2017-07-23 – 2017-07-24 (×2): 30 ug/kg/min via INTRAVENOUS
  Filled 2017-07-23 (×3): qty 100

## 2017-07-23 MED ORDER — DEXMEDETOMIDINE HCL IN NACL 200 MCG/50ML IV SOLN
0.4000 ug/kg/h | INTRAVENOUS | Status: DC
  Administered 2017-07-23: 0.4 ug/kg/h via INTRAVENOUS
  Filled 2017-07-23: qty 50

## 2017-07-23 MED ORDER — SODIUM CHLORIDE 0.9 % IV SOLN
Freq: Once | INTRAVENOUS | Status: AC
Start: 2017-07-23 — End: 2017-07-23
  Administered 2017-07-23: 11:00:00 via INTRAVENOUS

## 2017-07-23 MED ORDER — METOLAZONE 2.5 MG PO TABS
2.5000 mg | ORAL_TABLET | Freq: Once | ORAL | Status: AC
  Administered 2017-07-23: 2.5 mg via ORAL
  Filled 2017-07-23: qty 1

## 2017-07-23 MED ORDER — LACTULOSE 10 GM/15ML PO SOLN
30.0000 g | Freq: Once | ORAL | Status: AC
  Administered 2017-07-23: 30 g
  Filled 2017-07-23: qty 45

## 2017-07-23 NOTE — Progress Notes (Signed)
.. ..  Name: [name] MRN: [id number] DOB: 08-May-1941    ADMISSION DATE:  07/10/2017  CONSULTATION DATE:  07/12/17  REFERRING MD :  Nils Pyle MD   BRIEF:   77 year old female with past medical history significant for fusiform ascending aortic aneurysm, hypertension, hyperlipidemia, status post AVR and replacement of ascending aortic aneurysm.  On 07/10/2017 postprocedure patient developed acute cardiogenic shock had an episode of V. fib arrest was taken back to the OR on 07/11/2017 for evacuation of hematoma and placement of RVAD.  PCCM consulted for ventilator management. Failed extubation 1/24   Events - 07/10/17 Admit - 07/10/17 S/p AVR and ascending AA replacement  - 07/10/17 VF Arrest in evening with shock x 1 - 07/11/17 to OR for evacuation of hematoma and RVAD placement  - 07/12/17 PCCM consult 07/15/2017 Lasix per cardiology CVP of 5 1/22 heavily diuresed  1/23 ready for extubation 1/24: reintubated  STUDIES: Echo12/12/2016 LVEF 55-60%, Grade 1 DD, Mild/Mod AI with LVOT, Mid ascending aortic diameter 48.88 mm, Mild LAE, RV normal  Culture data 1/24 sputum: few wbc, rare GPC pairs and clusters>>>  abx Ceftaz1/17>>>1.20 Cefuroxime 1/21>>1/23 ceftaz 1/23>>>  SUBJECTIVE/OVERNIGHT/INTERVAL HX Afebrile Intubated & more awake on propofol/fent gtt Higher dose levo gtt Diuresed well with lasix  VITAL SIGNS: Temp:  [98.5 F (36.9 C)-99.6 F (37.6 C)] 98.7 F (37.1 C) (01/27 1114) Pulse Rate:  [90-107] 104 (01/27 1114) Resp:  [10-49] 20 (01/27 1114) BP: (73-121)/(43-63) 116/63 (01/27 1114) SpO2:  [90 %-97 %] 94 % (01/27 1114) Arterial Line BP: (86-153)/(40-64) 108/50 (01/27 0900) FiO2 (%):  [40 %] 40 % (01/27 1114) Weight:  [189 lb 13.1 oz (86.1 kg)] 189 lb 13.1 oz (86.1 kg) (01/27 0500)   Filed Weights   07/21/17 0500 07/22/17 0406 07/23/17 0500  Weight: 197 lb 12 oz (89.7 kg) 195 lb 15.8 oz (88.9 kg) 189 lb 13.1 oz (86.1 kg)    Intake/Output Summary (Last 24  hours) at 07/23/2017 1148 Last data filed at 07/23/2017 1100 Gross per 24 hour  Intake 3515.48 ml  Output 3192 ml  Net 323.48 ml     PHYSICAL EXAMINATION: General: elderly woman, sedated on ventilator.  No acute distress. HEENT: Normocephalic atraumatic orally intubated Pulmonary: decreased BS BL, no accessory use on ventilator Thorax: mediastinal chest tube unremarkable.  Sternal dressing and chest sutures intact. Cardiac: Regular rate and rhythm Abdomen: Soft nontender Extremities: Trace edema, brisk cap refill, warm, strong pulses. GU: Clear yellow Neuro: Alert, interactive but restless, moves all extremities  PULMONARY Recent Labs  Lab 07/19/17 1849  07/20/17 1029 07/20/17 1215 07/21/17 0331 07/21/17 0348 07/21/17 1556 07/22/17 0334 07/22/17 0359 07/22/17 1627 07/23/17 0146 07/23/17 0322  PHART 7.456*   < > 7.606* 7.565* 7.467*  --   --  7.488*  --   --  7.480*  --   PCO2ART 42.4   < > 33.9 35.5 46.6  --   --  42.4  --   --  43.0  --   PO2ART 79.0*   < > 58.0* 66.4* 98.9  --   --  76.5*  --   --  75.0*  --   HCO3 29.9*   < > 33.9* 32.4* 33.3*  --   --  31.8*  --   --  32.1*  --   TCO2 31  --  35*  --   --   --  32  --   --  29 33*  --   O2SAT 96.0   < >  94.0 94.6 97.5 71.6  --  96.1 64.2  --  96.0 73.0   < > = values in this interval not displayed.    CBC Recent Labs  Lab 07/21/17 0300  07/22/17 0348 07/22/17 1627 07/23/17 0307  HGB 8.9*   < > 8.2* 8.5* 7.6*  HCT 27.8*   < > 26.4* 25.0* 24.6*  WBC 23.1*  --  17.5*  --  13.8*  PLT 402*  --  377  --  360   < > = values in this interval not displayed.    COAGULATION Recent Labs  Lab 07/17/17 1144  INR 1.20    CARDIAC  No results for input(s): TROPONINI in the last 168 hours. No results for input(s): PROBNP in the last 168 hours.   CHEMISTRY Recent Labs  Lab 07/17/17 0346  07/20/17 0500 07/20/17 0902 07/20/17 1510 07/21/17 0300 07/21/17 1541 07/21/17 1556 07/21/17 1957 07/22/17 0348  07/22/17 1627 07/23/17 0307  NA 138   < > 138  --  139 137  --  138 141 138 140 139  K 4.2   < > 2.8*  --  3.3* 2.9*  --  3.6 3.7 3.2* 3.4* 3.4*  CL 100*   < > 93*  --  94* 94*  --  93* 96* 96* 95* 97*  CO2 28   < > 28  --  29 28  --   --  31 27  --  28  GLUCOSE 126*   < > 101*  --  85 182*  --  159* 122* 134* 120* 131*  BUN 8   < > 18  --  21* 28*  --  27* 28* 29* 27* 29*  CREATININE 0.57   < > 0.74  --  0.86 0.93  --  0.80 0.91 0.83 0.70 0.77  CALCIUM 8.0*   < > 6.8*  --  6.5* 6.5*  --   --  7.4* 6.9*  --  6.7*  MG 1.3*   < > 1.8 1.7 1.6* 2.0 1.8  --   --   --   --   --   PHOS 3.8  --   --  3.9 3.9 3.7 3.4  --   --   --   --   --    < > = values in this interval not displayed.   Estimated Creatinine Clearance: 65.8 mL/min (by C-G formula based on SCr of 0.77 mg/dL).   LIVER Recent Labs  Lab 07/17/17 1144 07/19/17 1418 07/20/17 0500 07/21/17 0300 07/22/17 0348 07/23/17 0307  AST  --  51* 52* 52* 50* 55*  ALT  --  35 37 33 35 37  ALKPHOS  --  172* 205* 188* 190* 197*  BILITOT  --  1.3* 2.0* 1.5* 1.1 1.1  PROT  --  5.2* 5.4* 5.0* 5.3* 5.5*  ALBUMIN  --  2.7* 2.7* 2.5* 2.7* 2.9*  INR 1.20  --   --   --   --   --     INFECTIOUS No results for input(s): LATICACIDVEN, PROCALCITON in the last 168 hours.  ENDOCRINE CBG (last 3)  Recent Labs    07/23/17 0440 07/23/17 0749 07/23/17 1121  GLUCAP 120* 119* 118*     IMAGING x48h  - image(s) personally visualized  -   highlighted in bold Dg Chest Port 1 View  Result Date: 07/23/2017 CLINICAL DATA:  Followup ventilator support. EXAM: PORTABLE CHEST 1 VIEW COMPARISON:  07/22/2017 FINDINGS: Endotracheal tube  tip is 2 cm above the carina. Nasogastric tube and soft feeding tube enters the abdomen. Right arm PICC tip at the SVC RA junction. Right chest tube remains in place. No visible pneumothorax. Improved basilar aeration with some persistent atelectasis left more than right. IMPRESSION: Lines and tubes well positioned. No  pneumothorax. Slightly improved basilar aeration. Electronically Signed   By: Nelson Chimes M.D.   On: 07/23/2017 06:46   Dg Chest Port 1 View  Result Date: 07/22/2017 CLINICAL DATA:  History of right ventricular assist device. EXAM: PORTABLE CHEST 1 VIEW COMPARISON:  07/21/2017.  07/20/2017. FINDINGS: Extensive artifact overlies the chest. The left thoracostomy tube has been removed. Chest tube tract remains visible. No pneumothorax. Endotracheal tube tip is at the carina. Nasogastric tube enters the abdomen. Right chest tube remains in place. No pneumothorax on the right. Right arm PICC tip at the distal SVC above the right atrium. Left subclavian central line tip projects over the SVC at the azygos level. Configuration on this examination could not exclude arterial positioning, but on previous studies the line was clearly venous. Assuming this is not been replaced, this relates to the projection. There is worsening atelectasis in both lower lobes. IMPRESSION: Left chest tube removed. Chest tube tract. No pneumothorax. Worsening atelectasis in both lower lobes. Endotracheal tube tip at the carina. Electronically Signed   By: Nelson Chimes M.D.   On: 07/22/2017 06:51     ASSESSMENT / PLAN: 77 year old female with PMHx significant for fusiform ascending aortic aneurysm, hypertension, hyperlipidemia, status post AVR and replacement of ascending aortic aneurysm.  On 07/10/2017 postprocedure patient developed acute cardiogenic shock had an episode of V. fib arrest was taken back to the OR on 07/11/2017 for evacuation of hematoma and placement of RVAD.  RVAD now removed, failed extubation 1/24 , now weaning well again  ASSESSMENT / PLAN:  Acute respiratory failure initially in setting of cardiogenic shock, as of 1/24 mechanism seems to be atelectasis, poor cough mechanics, deconditioning +/- HF: Required reintubation Chest x-ray personally reviewed- BB atx: ETT at carina Plan SBTs - tolerates PS 8/5 Plan  for early trach (giving time for sternum to heal) vs another trial of extubation to bipap   Cardiogenic shock s/p removal of RVAD 1/21; sternum now closed Atrial fibrillation per tele Medication related hypotension.  Back on levophed since started on propofol Still on milrinone and amio - Plan Continue levo gtt and inotropic support ct amiodarone Continue diuresis per CHF service   HCAP  - WBC coming down Plan F/u resp cx Day 5 ceftaz - plan 7ds total  Acute encephalopathy Plan Continue PAD protocol RASS goal 0 to -1 Change to precedex gtt, dc propofol  Hypokalemia and hypomagnesemia -->exacerbated by diuresis  Plan Replace and recheck  anemia of critical illness; no evidence of bleeding Thrombocytopenia -resolved  Plan Transfuse for Hb 8 or lower - 1 U PRBC 1/27  Hyperglycemia  Plan Sliding scale insulin  FAMILY  - Updates no family at bedside  - Inter-disciplinary family meet or Palliative Care meeting due by: done Current LOS is LOS 13 days  DISPO  DVT prophylaxis: lmwh SUP: PPI  Diet: tubefeeds Disposition : ICU   The patient is critically ill with multiple organ systems failure and requires high complexity decision making for assessment and support, frequent evaluation and titration of therapies, application of advanced monitoring technologies and extensive interpretation of multiple databases. Critical Care Time devoted to patient care services described in this note independent of APP/resident  time is 31 minutes.    Leanna Sato Elsworth Soho MD

## 2017-07-23 NOTE — Progress Notes (Signed)
CT surgery p.m. Rounds  Hemodynamics stable, FiO2 down to 40% Patient still with anxiety agitation issues Probably will need percutaneous trach to safely wean from ventilator and allow mobilization. Check coags in a.m. Tube feeds off at midnight in preparation for SVT in a.m Peripheral edema much better.

## 2017-07-23 NOTE — Progress Notes (Signed)
Changed A-Line and CVP pressure lines and bags per protocol

## 2017-07-23 NOTE — Progress Notes (Signed)
Patient ID: [name], female   DOB: 08-16-1940, 77 y.o.   MRN: [id number]     Advanced Heart Failure Rounding Note  Primary Cardiologist: Dr. Percival Spanish (2014) HF: (New) Dr. Haroldine Laws   Subjective:    Events - 07/10/17 S/p AVR and ascending AA replacement  - 07/10/17 VF Arrest in evening with shock x 1 - 07/11/17 RVAD placement  - 07/17/2017 RVAD removed with chest closed -1/23 Extubated. Went in A fib. Amio started.  -1/24 Reintubated.   Today she is awake and follows commands on vent.  Milrinone 0.25, norepinephrine 18.  CVP 17, co-ox 73%. She diuresed better but I/Os still even due to intake.  Creatinine stable.  Norepinephrine dose has fluctuated a lot, BP up and down with sedation level.   Echo 06/02/2017 LVEF 55-60%, Grade 1 DD, Mild/Mod AI with LVOT, Mid ascending aortic diameter 48.88 mm, Mild LAE, RV normal  Echo (07/22/17): EF 65-70%, bioprosthetic aortic valve functioning normally, PASP 33 mmHg, RV looks ok.   Objective:   Weight Range: 189 lb 13.1 oz (86.1 kg) Body mass index is 31.59 kg/m.   Vital Signs:   Temp:  [98.5 F (36.9 C)-99.6 F (37.6 C)] 99.6 F (37.6 C) (01/27 0800) Pulse Rate:  [90-107] 100 (01/27 0800) Resp:  [10-49] 23 (01/27 0800) BP: (73-121)/(43-63) 106/56 (01/27 0800) SpO2:  [90 %-97 %] 92 % (01/27 0800) Arterial Line BP: (86-153)/(40-64) 116/50 (01/27 0800) FiO2 (%):  [40 %] 40 % (01/27 0800) Weight:  [189 lb 13.1 oz (86.1 kg)] 189 lb 13.1 oz (86.1 kg) (01/27 0500) Last BM Date: (PTA)  Weight change: Filed Weights   07/21/17 0500 07/22/17 0406 07/23/17 0500  Weight: 197 lb 12 oz (89.7 kg) 195 lb 15.8 oz (88.9 kg) 189 lb 13.1 oz (86.1 kg)    Intake/Output:   Intake/Output Summary (Last 24 hours) at 07/23/2017 0807 Last data filed at 07/23/2017 0800 Gross per 24 hour  Intake 3666.37 ml  Output 3410 ml  Net 256.37 ml    Physical Exam  CVP 17  General: Intubated, awake. Neck: JVP 14+ cm, no thyromegaly or thyroid nodule.  Lungs:  Clear to auscultation bilaterally with normal respiratory effort. CV: Nondisplaced PMI.  Heart regular S1/S2, no S3/S4, 1/6 early SEM RUSB. 1+ ankle edema.   Abdomen: Soft, nontender, no hepatosplenomegaly, no distention.  Skin: Intact without lesions or rashes.  Neurologic: Awake on vent, follows commands  Extremities: No clubbing or cyanosis.  HEENT: Normal.    Telemetry  NSR 90s personally reviewed.    EKG    No new tracings.    Labs    CBC Recent Labs    07/22/17 0348 07/22/17 1627 07/23/17 0307  WBC 17.5*  --  13.8*  HGB 8.2* 8.5* 7.6*  HCT 26.4* 25.0* 24.6*  MCV 94.6  --  95.3  PLT 377  --  341   Basic Metabolic Panel Recent Labs    07/21/17 0300 07/21/17 1541  07/22/17 0348 07/22/17 1627 07/23/17 0307  NA 137  --    < > 138 140 139  K 2.9*  --    < > 3.2* 3.4* 3.4*  CL 94*  --    < > 96* 95* 97*  CO2 28  --    < > 27  --  28  GLUCOSE 182*  --    < > 134* 120* 131*  BUN 28*  --    < > 29* 27* 29*  CREATININE 0.93  --    < >  0.83 0.70 0.77  CALCIUM 6.5*  --    < > 6.9*  --  6.7*  MG 2.0 1.8  --   --   --   --   PHOS 3.7 3.4  --   --   --   --    < > = values in this interval not displayed.   Liver Function Tests Recent Labs    07/22/17 0348 07/23/17 0307  AST 50* 55*  ALT 35 37  ALKPHOS 190* 197*  BILITOT 1.1 1.1  PROT 5.3* 5.5*  ALBUMIN 2.7* 2.9*   No results for input(s): LIPASE, AMYLASE in the last 72 hours. Cardiac Enzymes No results for input(s): CKTOTAL, CKMB, CKMBINDEX, TROPONINI in the last 72 hours.  BNP: BNP (last 3 results) No results for input(s): BNP in the last 8760 hours.  ProBNP (last 3 results) No results for input(s): PROBNP in the last 8760 hours.   D-Dimer No results for input(s): DDIMER in the last 72 hours. Hemoglobin A1C No results for input(s): HGBA1C in the last 72 hours. Fasting Lipid Panel Recent Labs    07/20/17 1510  TRIG 134   Thyroid Function Tests No results for input(s): TSH, T4TOTAL, T3FREE,  THYROIDAB in the last 72 hours.  Invalid input(s): FREET3  Other results:   Imaging    Dg Chest Port 1 View  Result Date: 07/23/2017 CLINICAL DATA:  Followup ventilator support. EXAM: PORTABLE CHEST 1 VIEW COMPARISON:  07/22/2017 FINDINGS: Endotracheal tube tip is 2 cm above the carina. Nasogastric tube and soft feeding tube enters the abdomen. Right arm PICC tip at the SVC RA junction. Right chest tube remains in place. No visible pneumothorax. Improved basilar aeration with some persistent atelectasis left more than right. IMPRESSION: Lines and tubes well positioned. No pneumothorax. Slightly improved basilar aeration. Electronically Signed   By: Nelson Chimes M.D.   On: 07/23/2017 06:46    Medications:    Scheduled Medications: . acetaminophen (TYLENOL) oral liquid 160 mg/5 mL  650 mg Per Tube Once   Or  . acetaminophen  650 mg Rectal Once  . arformoterol  15 mcg Nebulization BID  . aspirin EC  325 mg Oral Daily   Or  . aspirin  324 mg Per Tube Daily  . bisacodyl  10 mg Oral Daily   Or  . bisacodyl  10 mg Rectal Daily  . budesonide (PULMICORT) nebulizer solution  0.5 mg Nebulization BID  . chlorhexidine gluconate (MEDLINE KIT)  15 mL Mouth Rinse BID  . Chlorhexidine Gluconate Cloth  6 each Topical Daily  . docusate  200 mg Per Tube Daily  . enoxaparin (LOVENOX) injection  40 mg Subcutaneous Q24H  . feeding supplement (PRO-STAT SUGAR FREE 64)  30 mL Per Tube BID  . feeding supplement (VITAL AF 1.2 CAL)  1,000 mL Per Tube Q24H  . furosemide  80 mg Intravenous BID  . insulin aspart  0-24 Units Subcutaneous Q4H  . insulin detemir  10 Units Subcutaneous BID  . latanoprost  1 drop Both Eyes QHS  . levalbuterol  1.25 mg Nebulization Q6H  . mouth rinse  15 mL Mouth Rinse 10 times per day  . metolazone  2.5 mg Oral Once  . naphazoline-glycerin  1-2 drop Both Eyes BID  . pantoprazole (PROTONIX) IV  40 mg Intravenous Q12H  . potassium chloride  40 mEq Per Tube BID  . QUEtiapine   100 mg Per Tube BID  . sodium chloride flush  10-40 mL Intracatheter Q12H  .  sodium chloride flush  10-40 mL Intracatheter Q12H  . sodium chloride flush  3 mL Intravenous Q12H  . venlafaxine XR  75 mg Oral Daily    Infusions: . sodium chloride 250 mL (07/22/17 1820)  . amiodarone 30 mg/hr (07/23/17 0700)  . calcium chloride    . cefTAZidime (FORTAZ)  IV Stopped (07/23/17 0235)  . fentaNYL infusion INTRAVENOUS 125 mcg/hr (07/23/17 0803)  . lactated ringers Stopped (07/17/17 1200)  . milrinone 0.25 mcg/kg/min (07/23/17 0600)  . norepinephrine (LEVOPHED) Adult infusion 18 mcg/min (07/22/17 2315)  . potassium chloride 10 mEq (07/23/17 0747)  . potassium chloride    . propofol (DIPRIVAN) infusion 30 mcg/kg/min (07/23/17 0803)    PRN Medications: fentaNYL, fentaNYL (SUBLIMAZE) injection, metoprolol tartrate, midazolam, ondansetron (ZOFRAN) IV, sodium chloride flush    Patient Profile   [name] is a 77 y.o. female with history of fusiform ascending aortic aneurysm, HTN, and HLD.   Admitted 07/10/17 for planned AVR and replacement of ascending aortic aneurysm. Developed acute cardiogenic shock + VF arrest. Taken back to OR am of 07/11/17 for evacuation of hematoma and placement of RVAD.   Assessment/Plan   1. Cardiogenic shock s/p cardiac surgery with R>L CHF:  Pre-op Echo 06/02/2017 LVEF 55-60%, Grade 1 DD, Mild/Mod AI with LVOT, Mid ascending aortic diameter 48.88 mm, Mild LAE, RV normal.  Post-op shock, RVAD placed now s/p RVAD removal 07/17/2017.  Echo 1/26 with EF 65-70%, bioprosthetic aortic valve, improved RV function.  Co-ox 73% today with CVP 17.  - Remains on milrinone 0.25 mcg + NE 18 mcg to support RV. Continue current milrinone, should be able to wean down norepinephrine (SBP currently in 120s). RV function looked better on echo yesterday.  - Continue Lasix 80 mg IV bid and will give 1 dose metolazone 2.5.  Will replace K aggressively.  2. VF arrest: Stable, no further  arrhythmia. Currently on amiodarone gtt.  - Supplement K.  3. Atrial fibrillation: NSR this morning.  - Continue amio 30 mg per hour.  - Currently only getting DVT prophylaxis. 4. Acute respiratory failure: Reintubated 1/24.  Tracheostomy planned next week.  Suspect HCAP. - Continues on ceftazidime per CCM.  5. S/p AVR and Ascending aortic aneurysm replacement. - Post-op echo. 6. AKI: Resolved 7. Anemia: Hgb < 8, will give 1 unit PRBCs.   Length of Stay: 13  Loralie Champagne, MD  8:07 AM Advanced Heart Failure Team Pager [phone] (M-F; 7a - 4p)  Please contact [facility] for night-coverage after hours (4p -7a ) and weekends on [email or web address]

## 2017-07-23 NOTE — Progress Notes (Signed)
6 Days Post-Op Procedure(s) (LRB): REMOVAL OF RVAD WITH PUMP STANDBY (N/A) TRANSESOPHAGEAL ECHOCARDIOGRAM (TEE) (N/A) Subjective: stable on vent , good cardiac function Diuresing well  1 u PRBC for Hb 7.5  Objective: Vital signs in last 24 hours: Temp:  [98.5 F (36.9 C)-99.6 F (37.6 C)] 99.6 F (37.6 C) (01/27 0800) Pulse Rate:  [90-107] 101 (01/27 1000) Cardiac Rhythm: Normal sinus rhythm;Sinus tachycardia (01/27 0800) Resp:  [10-49] 22 (01/27 1000) BP: (73-121)/(43-63) 107/52 (01/27 1000) SpO2:  [90 %-97 %] 91 % (01/27 1000) Arterial Line BP: (86-153)/(40-64) 108/50 (01/27 0900) FiO2 (%):  [40 %] 40 % (01/27 0800) Weight:  [189 lb 13.1 oz (86.1 kg)] 189 lb 13.1 oz (86.1 kg) (01/27 0500)  Hemodynamic parameters for last 24 hours: CVP:  [9 mmHg-24 mmHg] 17 mmHg  Intake/Output from previous day: 01/26 0701 - 01/27 0700 In: 3682.2 [I.V.:1632.2; NG/GT:1550; IV Piggyback:500] Out: 6073 [Urine:3475; Chest Tube:110] Intake/Output this shift: Total I/O In: 435.7 [I.V.:200.7; NG/GT:135; IV Piggyback:100] Out: 390 [Urine:260; Emesis/NG output:100; Chest Tube:30]       Exam    General- alert and comfortable    Neck- no JVD, no cervical adenopathy palpable, no carotid bruit   Lungs- clear without rales, wheezes   Cor- regular rate and rhythm, no murmur , gallop   Abdomen- soft, non-tender   Extremities - warm, non-tender, minimal edema   Neuro- oriented, appropriate, no focal weakness   Lab Results: Recent Labs    07/22/17 0348 07/22/17 1627 07/23/17 0307  WBC 17.5*  --  13.8*  HGB 8.2* 8.5* 7.6*  HCT 26.4* 25.0* 24.6*  PLT 377  --  360   BMET:  Recent Labs    07/22/17 0348 07/22/17 1627 07/23/17 0307  NA 138 140 139  K 3.2* 3.4* 3.4*  CL 96* 95* 97*  CO2 27  --  28  GLUCOSE 134* 120* 131*  BUN 29* 27* 29*  CREATININE 0.83 0.70 0.77  CALCIUM 6.9*  --  6.7*    PT/INR: No results for input(s): LABPROT, INR in the last 72 hours. ABG    Component Value  Date/Time   PHART 7.480 (H) 07/23/2017 0146   HCO3 32.1 (H) 07/23/2017 0146   TCO2 33 (H) 07/23/2017 0146   ACIDBASEDEF 1.0 07/18/2017 0404   O2SAT 73.0 07/23/2017 0322   CBG (last 3)  Recent Labs    07/22/17 2350 07/23/17 0440 07/23/17 0749  GLUCAP 117* 120* 119*    Assessment/Plan: S/P Procedure(s) (LRB): REMOVAL OF RVAD WITH PUMP STANDBY (N/A) TRANSESOPHAGEAL ECHOCARDIOGRAM (TEE) (N/A) PS weaning today SBT in am then extubate or trach  TF off at midnight   LOS: 13 days    [name] 07/23/2017

## 2017-07-24 ENCOUNTER — Inpatient Hospital Stay (HOSPITAL_COMMUNITY): Payer: Medicare Other

## 2017-07-24 LAB — POCT I-STAT 3, ART BLOOD GAS (G3+)
Acid-Base Excess: 9 mmol/L — ABNORMAL HIGH (ref 0.0–2.0)
Acid-Base Excess: 8 mmol/L — ABNORMAL HIGH (ref 0.0–2.0)
Bicarbonate: 32.4 mmol/L — ABNORMAL HIGH (ref 20.0–28.0)
Bicarbonate: 31.5 mmol/L — ABNORMAL HIGH (ref 20.0–28.0)
O2 Saturation: 93 %
O2 Saturation: 93 %
Patient temperature: 98.6
TCO2: 34 mmol/L — ABNORMAL HIGH (ref 22–32)
TCO2: 33 mmol/L — ABNORMAL HIGH (ref 22–32)
pCO2 arterial: 40.6 mmHg (ref 32.0–48.0)
pCO2 arterial: 38.6 mmHg (ref 32.0–48.0)
pH, Arterial: 7.51 — ABNORMAL HIGH (ref 7.350–7.450)
pH, Arterial: 7.52 — ABNORMAL HIGH (ref 7.350–7.450)
pO2, Arterial: 59 mmHg — ABNORMAL LOW (ref 83.0–108.0)
pO2, Arterial: 61 mmHg — ABNORMAL LOW (ref 83.0–108.0)

## 2017-07-24 LAB — GLUCOSE, CAPILLARY
Glucose-Capillary: 100 mg/dL — ABNORMAL HIGH (ref 65–99)
Glucose-Capillary: 88 mg/dL (ref 65–99)
Glucose-Capillary: 132 mg/dL — ABNORMAL HIGH (ref 65–99)
Glucose-Capillary: 88 mg/dL (ref 65–99)
Glucose-Capillary: 98 mg/dL (ref 65–99)
Glucose-Capillary: 110 mg/dL — ABNORMAL HIGH (ref 65–99)
Glucose-Capillary: 144 mg/dL — ABNORMAL HIGH (ref 65–99)

## 2017-07-24 LAB — CBC
HCT: 28 % — ABNORMAL LOW (ref 36.0–46.0)
Hemoglobin: 8.6 g/dL — ABNORMAL LOW (ref 12.0–15.0)
MCH: 29.5 pg (ref 26.0–34.0)
MCHC: 30.7 g/dL (ref 30.0–36.0)
MCV: 95.9 fL (ref 78.0–100.0)
Platelets: 395 10*3/uL (ref 150–400)
RBC: 2.92 MIL/uL — ABNORMAL LOW (ref 3.87–5.11)
RDW: 17.9 % — ABNORMAL HIGH (ref 11.5–15.5)
WBC: 13.5 10*3/uL — ABNORMAL HIGH (ref 4.0–10.5)

## 2017-07-24 LAB — COMPREHENSIVE METABOLIC PANEL
ALT: 39 U/L (ref 14–54)
AST: 57 U/L — ABNORMAL HIGH (ref 15–41)
Albumin: 2.9 g/dL — ABNORMAL LOW (ref 3.5–5.0)
Alkaline Phosphatase: 222 U/L — ABNORMAL HIGH (ref 38–126)
Anion gap: 15 (ref 5–15)
BUN: 28 mg/dL — ABNORMAL HIGH (ref 6–20)
CO2: 29 mmol/L (ref 22–32)
Calcium: 6.9 mg/dL — ABNORMAL LOW (ref 8.9–10.3)
Chloride: 95 mmol/L — ABNORMAL LOW (ref 101–111)
Creatinine, Ser: 0.74 mg/dL (ref 0.44–1.00)
GFR calc Af Amer: >60 mL/min (ref >60)
GFR calc non Af Amer: >60 mL/min (ref >60)
Glucose, Bld: 106 mg/dL — ABNORMAL HIGH (ref 65–99)
Potassium: 3.1 mmol/L — ABNORMAL LOW (ref 3.5–5.1)
Sodium: 139 mmol/L (ref 135–145)
Total Bilirubin: 1.2 mg/dL (ref 0.3–1.2)
Total Protein: 5.8 g/dL — ABNORMAL LOW (ref 6.5–8.1)

## 2017-07-24 LAB — COOXEMETRY PANEL
Carboxyhemoglobin: 1.7 % — ABNORMAL HIGH (ref 0.5–1.5)
Methemoglobin: 1.3 % (ref 0.0–1.5)
O2 Saturation: 63.4 %
Total hemoglobin: 8.7 g/dL — ABNORMAL LOW (ref 12.0–16.0)

## 2017-07-24 LAB — POCT I-STAT 4, (NA,K, GLUC, HGB,HCT)
Glucose, Bld: 98 mg/dL (ref 65–99)
HCT: 26 % — ABNORMAL LOW (ref 36.0–46.0)
Hemoglobin: 8.8 g/dL — ABNORMAL LOW (ref 12.0–15.0)
Potassium: 3.4 mmol/L — ABNORMAL LOW (ref 3.5–5.1)
Sodium: 139 mmol/L (ref 135–145)

## 2017-07-24 LAB — PROTIME-INR
INR: 1.14
Prothrombin Time: 14.5 seconds (ref 11.4–15.2)

## 2017-07-24 LAB — MAGNESIUM: Magnesium: 2.2 mg/dL (ref 1.7–2.4)

## 2017-07-24 LAB — APTT: aPTT: 35 seconds (ref 24–36)

## 2017-07-24 LAB — LACTIC ACID, PLASMA: Lactic Acid, Venous: 0.7 mmol/L (ref 0.5–1.9)

## 2017-07-24 MED ORDER — POTASSIUM CHLORIDE 10 MEQ/50ML IV SOLN
10.0000 meq | INTRAVENOUS | Status: AC
  Administered 2017-07-24 (×3): 10 meq via INTRAVENOUS
  Filled 2017-07-24 (×3): qty 50

## 2017-07-24 MED ORDER — FENTANYL CITRATE (PF) 100 MCG/2ML IJ SOLN
200.0000 ug | Freq: Once | INTRAMUSCULAR | Status: AC
  Administered 2017-07-25: 200 ug via INTRAVENOUS
  Filled 2017-07-24: qty 4

## 2017-07-24 MED ORDER — VECURONIUM BROMIDE 10 MG IV SOLR
10.0000 mg | Freq: Once | INTRAVENOUS | Status: AC
  Administered 2017-07-25: 10 mg via INTRAVENOUS
  Filled 2017-07-24: qty 10

## 2017-07-24 MED ORDER — QUETIAPINE FUMARATE 100 MG PO TABS
100.0000 mg | ORAL_TABLET | Freq: Every day | ORAL | Status: DC
  Administered 2017-07-24 – 2017-07-25 (×2): 100 mg
  Filled 2017-07-24 (×2): qty 1

## 2017-07-24 MED ORDER — MIDAZOLAM HCL 2 MG/2ML IJ SOLN
2.0000 mg | INTRAMUSCULAR | Status: DC | PRN
  Administered 2017-07-24: 4 mg via INTRAVENOUS
  Administered 2017-07-24: 2 mg via INTRAVENOUS
  Administered 2017-07-24 (×4): 4 mg via INTRAVENOUS
  Administered 2017-07-25: 2 mg via INTRAVENOUS
  Administered 2017-07-25: 4 mg via INTRAVENOUS
  Administered 2017-07-25: 2 mg via INTRAVENOUS
  Administered 2017-07-25 (×6): 4 mg via INTRAVENOUS
  Administered 2017-07-25: 2 mg via INTRAVENOUS
  Administered 2017-07-25: 4 mg via INTRAVENOUS
  Administered 2017-07-25 – 2017-07-26 (×4): 2 mg via INTRAVENOUS
  Administered 2017-07-26: 4 mg via INTRAVENOUS
  Administered 2017-07-26 (×5): 2 mg via INTRAVENOUS
  Administered 2017-07-26: 4 mg via INTRAVENOUS
  Administered 2017-07-26: 2 mg via INTRAVENOUS
  Administered 2017-07-27: 4 mg via INTRAVENOUS
  Administered 2017-07-27 (×4): 2 mg via INTRAVENOUS
  Filled 2017-07-24 (×4): qty 4
  Filled 2017-07-24: qty 2
  Filled 2017-07-24 (×4): qty 4
  Filled 2017-07-24 (×3): qty 2
  Filled 2017-07-24 (×5): qty 4
  Filled 2017-07-24 (×2): qty 2
  Filled 2017-07-24 (×5): qty 4
  Filled 2017-07-24 (×2): qty 2
  Filled 2017-07-24: qty 4
  Filled 2017-07-24 (×2): qty 2
  Filled 2017-07-24: qty 4

## 2017-07-24 MED ORDER — PROPOFOL 500 MG/50ML IV EMUL
5.0000 ug/kg/min | Freq: Once | INTRAVENOUS | Status: AC
  Administered 2017-07-25: 5 ug/kg/min via INTRAVENOUS
  Filled 2017-07-24: qty 50

## 2017-07-24 MED ORDER — DEXMEDETOMIDINE HCL IN NACL 400 MCG/100ML IV SOLN
0.5000 ug/kg/h | INTRAVENOUS | Status: DC
  Administered 2017-07-24 – 2017-07-25 (×6): 1.2 ug/kg/h via INTRAVENOUS
  Administered 2017-07-25: 1.7 ug/kg/h via INTRAVENOUS
  Administered 2017-07-25 (×3): 1.2 ug/kg/h via INTRAVENOUS
  Administered 2017-07-26 – 2017-07-27 (×7): 1.7 ug/kg/h via INTRAVENOUS
  Administered 2017-07-27: 0.3 ug/kg/h via INTRAVENOUS
  Administered 2017-07-27: 1.7 ug/kg/h via INTRAVENOUS
  Administered 2017-07-28 (×3): 0.5 ug/kg/h via INTRAVENOUS
  Filled 2017-07-24 (×26): qty 100

## 2017-07-24 MED ORDER — MILRINONE LACTATE IN DEXTROSE 20-5 MG/100ML-% IV SOLN
0.2500 ug/kg/min | INTRAVENOUS | Status: DC
  Administered 2017-07-24: 0.125 ug/kg/min via INTRAVENOUS
  Administered 2017-07-25 – 2017-07-27 (×4): 0.25 ug/kg/min via INTRAVENOUS
  Filled 2017-07-24 (×5): qty 100

## 2017-07-24 MED ORDER — ETOMIDATE 2 MG/ML IV SOLN
40.0000 mg | Freq: Once | INTRAVENOUS | Status: AC
  Administered 2017-07-25: 20 mg via INTRAVENOUS
  Filled 2017-07-24: qty 20

## 2017-07-24 MED ORDER — POTASSIUM CHLORIDE 10 MEQ/50ML IV SOLN
10.0000 meq | INTRAVENOUS | Status: AC
  Administered 2017-07-24 (×3): 10 meq via INTRAVENOUS
  Filled 2017-07-24: qty 50

## 2017-07-24 MED ORDER — MIDAZOLAM HCL 2 MG/2ML IJ SOLN
4.0000 mg | Freq: Once | INTRAMUSCULAR | Status: AC
  Administered 2017-07-25: 4 mg via INTRAVENOUS
  Filled 2017-07-24: qty 4

## 2017-07-24 MED ORDER — ALBUMIN HUMAN 25 % IV SOLN
12.5000 g | Freq: Four times a day (QID) | INTRAVENOUS | Status: AC
  Administered 2017-07-24 – 2017-07-25 (×3): 12.5 g via INTRAVENOUS
  Filled 2017-07-24 (×3): qty 50

## 2017-07-24 MED ORDER — DEXMEDETOMIDINE HCL IN NACL 200 MCG/50ML IV SOLN
0.4000 ug/kg/h | INTRAVENOUS | Status: DC
  Administered 2017-07-24 (×2): 1.2 ug/kg/h via INTRAVENOUS
  Filled 2017-07-24 (×3): qty 50

## 2017-07-24 MED ORDER — POTASSIUM CHLORIDE 10 MEQ/50ML IV SOLN
10.0000 meq | INTRAVENOUS | Status: AC
  Administered 2017-07-24 (×2): 10 meq via INTRAVENOUS
  Filled 2017-07-24: qty 50

## 2017-07-24 NOTE — Progress Notes (Signed)
Patient ID: [name], female   DOB: January 06, 1941, 77 y.o.   MRN: [id number] T[facility] Evening Rounds:  Hemodynamically stable on milrinone 0.125 and levophed 5  CVP 13.  Remains sedated on vent. Plan trach  Urine output good.  BMET    Component Value Date/Time   NA 139 07/24/2017 1619   NA 143 03/15/2017 0920   K 3.4 (L) 07/24/2017 1619   CL 95 (L) 07/24/2017 0332   CO2 29 07/24/2017 0332   GLUCOSE 98 07/24/2017 1619   BUN 28 (H) 07/24/2017 0332   BUN 12 03/15/2017 0920   CREATININE 0.74 07/24/2017 0332   CREATININE 0.65 10/25/2012 0826   CALCIUM 6.9 (L) 07/24/2017 0332   GFRNONAA >60 07/24/2017 0332   GFRNONAA >89 10/25/2012 0826   GFRAA >60 07/24/2017 0332   GFRAA >89 10/25/2012 0826   CBC    Component Value Date/Time   WBC 13.5 (H) 07/24/2017 0332   RBC 2.92 (L) 07/24/2017 0332   HGB 8.8 (L) 07/24/2017 1619   HGB 11.8 03/15/2017 0920   HCT 26.0 (L) 07/24/2017 1619   HCT 36.3 03/15/2017 0920   PLT 395 07/24/2017 0332   PLT 251 03/15/2017 0920   MCV 95.9 07/24/2017 0332   MCV 87 03/15/2017 0920   MCH 29.5 07/24/2017 0332   MCHC 30.7 07/24/2017 0332   RDW 17.9 (H) 07/24/2017 0332   RDW 13.8 03/15/2017 0920   LYMPHSABS 1.5 07/17/2017 0346   LYMPHSABS 1.4 03/15/2017 0920   MONOABS 1.9 (H) 07/17/2017 0346   EOSABS 0.1 07/17/2017 0346   EOSABS 0.2 03/15/2017 0920   BASOSABS 0.0 07/17/2017 0346   BASOSABS 0.0 03/15/2017 0920

## 2017-07-24 NOTE — Plan of Care (Signed)
  Progressing Cardiac: Hemodynamic stability will improve 07/24/2017 0024 - Progressing by Burman Blacksmith, RN Note Weaning vasoactive gtts Clinical Measurements: Postoperative complications will be avoided or minimized 07/24/2017 0024 - Progressing by Burman Blacksmith, RN Respiratory: Respiratory status will improve 07/24/2017 0024 - Progressing by Burman Blacksmith, RN Note SBT vs. Lurline Idol 1/28 Skin Integrity: Wound healing without signs and symptoms of infection 07/24/2017 0024 - Progressing by Burman Blacksmith, RN Risk for impaired skin integrity will decrease 07/24/2017 0024 - Progressing by Burman Blacksmith, RN Urinary Elimination: Ability to achieve and maintain adequate renal perfusion and functioning will improve 07/24/2017 0024 - Progressing by Burman Blacksmith, RN Note Within parameters

## 2017-07-24 NOTE — Progress Notes (Signed)
7 Days Post-Op Procedure(s) (LRB): REMOVAL OF RVAD WITH PUMP STANDBY (N/A) TRANSESOPHAGEAL ECHOCARDIOGRAM (TEE) (N/A) Subjective: Anxious with decreased sedation, Inc RR Po2 < 60 on vent REc percutaneous  By CCM or surgical trach if anatomy not safe Cont iv lasix, hold TF Objective: Vital signs in last 24 hours: Temp:  [98.2 F (36.8 C)-99.6 F (37.6 C)] 98.3 F (36.8 C) (01/28 0300) Pulse Rate:  [76-114] 84 (01/28 0743) Cardiac Rhythm: Normal sinus rhythm;Sinus tachycardia (01/28 0400) Resp:  [15-52] 25 (01/28 0743) BP: (78-121)/(47-76) 118/56 (01/28 0743) SpO2:  [87 %-97 %] 96 % (01/28 0743) Arterial Line BP: (108-116)/(50) 108/50 (01/27 0900) FiO2 (%):  [40 %] 40 % (01/28 0743) Weight:  [191 lb 9.3 oz (86.9 kg)] 191 lb 9.3 oz (86.9 kg) (01/28 0500)  Hemodynamic parameters for last 24 hours: CVP:  [10 mmHg-25 mmHg] 20 mmHg  Intake/Output from previous day: 01/27 0701 - 01/28 0700 In: 3500.8 [I.V.:1641.6; Blood:327.5; NG/GT:881.8; IV Piggyback:650] Out: 7741 [OINOM:7672; Emesis/NG output:700; Chest Tube:100] Intake/Output this shift: No intake/output data recorded.  Anxious Lungs clear Edema better  nsr\ sternum dry clean  Lab Results: Recent Labs    07/23/17 0307  07/23/17 1602 07/24/17 0332  WBC 13.8*  --   --  13.5*  HGB 7.6*   < > 8.8* 8.6*  HCT 24.6*   < > 26.0* 28.0*  PLT 360  --   --  395   < > = values in this interval not displayed.   BMET:  Recent Labs    07/23/17 0307 07/23/17 1602 07/24/17 0332  NA 139 139 139  K 3.4* 3.5 3.1*  CL 97* 94* 95*  CO2 28  --  29  GLUCOSE 131* 141* 106*  BUN 29* 26* 28*  CREATININE 0.77 0.70 0.74  CALCIUM 6.7*  --  6.9*    PT/INR:  Recent Labs    07/24/17 0332  LABPROT 14.5  INR 1.14   ABG    Component Value Date/Time   PHART 7.510 (H) 07/24/2017 0257   HCO3 32.4 (H) 07/24/2017 0257   TCO2 34 (H) 07/24/2017 0257   ACIDBASEDEF 1.0 07/18/2017 0404   O2SAT 63.4 07/24/2017 0340   CBG (last 3)   Recent Labs    07/23/17 1959 07/23/17 2311 07/24/17 0327  GLUCAP 123* 103* 100*    Assessment/Plan: S/P Procedure(s) (LRB): REMOVAL OF RVAD WITH PUMP STANDBY (N/A) TRANSESOPHAGEAL ECHOCARDIOGRAM (TEE) (N/A) Trach then reduce sedation mobilize and resume TF   LOS: 14 days    [name] 07/24/2017

## 2017-07-24 NOTE — Progress Notes (Signed)
OT Cancellation Note  Patient Details Name: LYNASIA MELOCHE MRN: [id number] DOB: [birth year]   Cancelled Treatment:    Reason Eval/Treat Not Completed: Medical issues which prohibited therapy(pt remains on vent)  Malka So 07/24/2017, 11:02 AM  07/24/2017 Nestor Lewandowsky, OTR/L Pager: [phone]

## 2017-07-24 NOTE — Progress Notes (Signed)
PT Cancellation Note  Patient Details Name: [name] MRN: [id number] DOB: 1941-06-14   Cancelled Treatment:    Reason Eval/Treat Not Completed: (P) Medical issues which prohibited therapy(pt remains intubated, will cancel treatment and follow up per POC when pt is medically appropriate.  )   Tc [place]usta Eli Hose 07/24/2017, 11:04 AM  Governor Rooks, PTA pager [phone]

## 2017-07-24 NOTE — Progress Notes (Signed)
Patient ID: [name], female   DOB: 30-Sep-1940, 77 y.o.   MRN: [id number]     Advanced Heart Failure Rounding Note  Primary Cardiologist: Dr. Percival Spanish (2014) HF: (New) Dr. Haroldine Laws   Subjective:    Events - 07/10/17 S/p AVR and ascending AA replacement  - 07/10/17 VF Arrest in evening with shock x 1 - 07/11/17 RVAD placement  - 07/17/2017 RVAD removed with chest closed -1/23 Extubated. Went in A fib. Amio started.  -1/24 Reintubated.   Coox 63.4% on Milrinone 0.25 and Norepi 10. CVP 14-15. Negative 780 cc but weight up 2 lbs.   Awake on vent. Nods to questioning and follows commands. Requiring Norepi with sedation.  Potentially getting Lurline Idol this week.   Echo 06/02/2017 LVEF 55-60%, Grade 1 DD, Mild/Mod AI with LVOT, Mid ascending aortic diameter 48.88 mm, Mild LAE, RV normal  Echo (07/22/17): EF 65-70%, bioprosthetic aortic valve functioning normally, PASP 33 mmHg, RV looks ok.   Objective:   Weight Range: 191 lb 9.3 oz (86.9 kg) Body mass index is 31.88 kg/m.   Vital Signs:   Temp:  [98.2 F (36.8 C)-99.6 F (37.6 C)] 98.3 F (36.8 C) (01/28 0300) Pulse Rate:  [76-114] 81 (01/28 0645) Resp:  [15-52] 22 (01/28 0645) BP: (78-121)/(47-76) 102/49 (01/28 0645) SpO2:  [87 %-97 %] 87 % (01/28 0645) Arterial Line BP: (108-116)/(50) 108/50 (01/27 0900) FiO2 (%):  [40 %] 40 % (01/28 0308) Weight:  [191 lb 9.3 oz (86.9 kg)] 191 lb 9.3 oz (86.9 kg) (01/28 0500) Last BM Date: (PTA)  Weight change: Filed Weights   07/22/17 0406 07/23/17 0500 07/24/17 0500  Weight: 195 lb 15.8 oz (88.9 kg) 189 lb 13.1 oz (86.1 kg) 191 lb 9.3 oz (86.9 kg)    Intake/Output:   Intake/Output Summary (Last 24 hours) at 07/24/2017 0728 Last data filed at 07/24/2017 0606 Gross per 24 hour  Intake 3500.84 ml  Output 4297 ml  Net -796.16 ml    Physical Exam  CVP 14-15  General: Intubated.  HEENT: + ETT Neck: Supple. JVP to jaw. Carotids 2+ bilat; no bruits. No thyromegaly or nodule  noted. Cor: PMI nondisplaced. RRR, 1/6 early SEM RUSB. 1+ ankle edema.  Lungs: Diminished basilar sounds Abdomen: Soft, non-tender, non-distended, no HSM. No bruits or masses. +BS  Extremities: No cyanosis, clubbing, or rash. Trace ankle edema.  Neuro: Intubated  Telemetry   NSR 90s, personally reviewed.   EKG    No new tracings.    Labs    CBC Recent Labs    07/23/17 0307  07/23/17 1602 07/24/17 0332  WBC 13.8*  --   --  13.5*  HGB 7.6*   < > 8.8* 8.6*  HCT 24.6*   < > 26.0* 28.0*  MCV 95.3  --   --  95.9  PLT 360  --   --  395   < > = values in this interval not displayed.   Basic Metabolic Panel Recent Labs    07/21/17 1541  07/23/17 0307 07/23/17 1602 07/23/17 1741 07/24/17 0332  NA  --    < > 139 139  --  139  K  --    < > 3.4* 3.5  --  3.1*  CL  --    < > 97* 94*  --  95*  CO2  --    < > 28  --   --  29  GLUCOSE  --    < > 131* 141*  --  106*  BUN  --    < > 29* 26*  --  28*  CREATININE  --    < > 0.77 0.70  --  0.74  CALCIUM  --    < > 6.7*  --   --  6.9*  MG 1.8  --   --   --  1.7 2.2  PHOS 3.4  --   --   --   --   --    < > = values in this interval not displayed.   Liver Function Tests Recent Labs    07/23/17 0307 07/24/17 0332  AST 55* 57*  ALT 37 39  ALKPHOS 197* 222*  BILITOT 1.1 1.2  PROT 5.5* 5.8*  ALBUMIN 2.9* 2.9*   No results for input(s): LIPASE, AMYLASE in the last 72 hours. Cardiac Enzymes No results for input(s): CKTOTAL, CKMB, CKMBINDEX, TROPONINI in the last 72 hours.  BNP: BNP (last 3 results) No results for input(s): BNP in the last 8760 hours.  ProBNP (last 3 results) No results for input(s): PROBNP in the last 8760 hours.   D-Dimer No results for input(s): DDIMER in the last 72 hours. Hemoglobin A1C No results for input(s): HGBA1C in the last 72 hours. Fasting Lipid Panel Recent Labs    07/23/17 1358  TRIG 530*   Thyroid Function Tests No results for input(s): TSH, T4TOTAL, T3FREE, THYROIDAB in the last  72 hours.  Invalid input(s): FREET3  Other results:   Imaging    No results found.  Medications:    Scheduled Medications: . acetaminophen (TYLENOL) oral liquid 160 mg/5 mL  650 mg Per Tube Once   Or  . acetaminophen  650 mg Rectal Once  . arformoterol  15 mcg Nebulization BID  . aspirin EC  325 mg Oral Daily   Or  . aspirin  324 mg Per Tube Daily  . bisacodyl  10 mg Oral Daily   Or  . bisacodyl  10 mg Rectal Daily  . budesonide (PULMICORT) nebulizer solution  0.5 mg Nebulization BID  . chlorhexidine gluconate (MEDLINE KIT)  15 mL Mouth Rinse BID  . Chlorhexidine Gluconate Cloth  6 each Topical Daily  . docusate  200 mg Per Tube Daily  . enoxaparin (LOVENOX) injection  40 mg Subcutaneous Q24H  . feeding supplement (PRO-STAT SUGAR FREE 64)  30 mL Per Tube BID  . feeding supplement (VITAL AF 1.2 CAL)  1,000 mL Per Tube Q24H  . furosemide  80 mg Intravenous BID  . insulin aspart  0-24 Units Subcutaneous Q4H  . insulin detemir  10 Units Subcutaneous BID  . latanoprost  1 drop Both Eyes QHS  . levalbuterol  1.25 mg Nebulization Q6H  . mouth rinse  15 mL Mouth Rinse 10 times per day  . naphazoline-glycerin  1-2 drop Both Eyes BID  . pantoprazole (PROTONIX) IV  40 mg Intravenous Q12H  . potassium chloride  40 mEq Per Tube BID  . QUEtiapine  100 mg Per Tube QHS  . sodium chloride flush  10-40 mL Intracatheter Q12H  . sodium chloride flush  10-40 mL Intracatheter Q12H  . sodium chloride flush  3 mL Intravenous Q12H  . venlafaxine XR  75 mg Oral Daily    Infusions: . sodium chloride 250 mL (07/22/17 1820)  . amiodarone 30 mg/hr (07/24/17 0600)  . calcium chloride    . cefTAZidime (FORTAZ)  IV Stopped (07/24/17 0216)  . fentaNYL infusion INTRAVENOUS 250 mcg/hr (07/24/17 0600)  . lactated ringers  Stopped (07/17/17 1200)  . milrinone 0.25 mcg/kg/min (07/24/17 0600)  . norepinephrine (LEVOPHED) Adult infusion 10 mcg/min (07/23/17 1701)  . potassium chloride Stopped  (07/24/17 8280)  . potassium chloride    . propofol (DIPRIVAN) infusion 30.004 mcg/kg/min (07/24/17 0600)    PRN Medications: fentaNYL, fentaNYL (SUBLIMAZE) injection, metoprolol tartrate, midazolam, ondansetron (ZOFRAN) IV, sodium chloride flush    Patient Profile   [name] is a 77 y.o. female with history of fusiform ascending aortic aneurysm, HTN, and HLD.   Admitted 07/10/17 for planned AVR and replacement of ascending aortic aneurysm. Developed acute cardiogenic shock + VF arrest. Taken back to OR am of 07/11/17 for evacuation of hematoma and placement of RVAD.   Assessment/Plan   1. Cardiogenic shock s/p cardiac surgery with R>L CHF:  Pre-op Echo 06/02/2017 LVEF 55-60%, Grade 1 DD, Mild/Mod AI with LVOT, Mid ascending aortic diameter 48.88 mm, Mild LAE, RV normal.  Post-op shock, RVAD placed now s/p RVAD removal 07/17/2017.  Echo 1/26 with EF 65-70%, bioprosthetic aortic valve, improved RV function.  Co-ox 63.4% today with CVP 14-15 - Remains on milrinone 0.25 mcg + NE 10 mcg to support RV.  - Continue milrinone.  - RV function looked better on echo 07/22/16.  - Volume status remains elevated. Continue IV lasix 80 mg BID to work towards extubation vs trach.  2. VF arrest:  - Stable. No further arrhythmia. Remains on amiodarone gtt.  - Supplement K. Goal K > 4.0 and Mg > 2.0 3. Atrial fibrillation:  - Remains this NSR.   - Continue amio gtt while on milrinone. - Currently only getting DVT prophylaxis. 4. Acute respiratory failure: Reintubated 1/24.   - Tracheostomy planned this week. Suspect HCAP. - Continues on ceftazidime per CCM.  5. S/p AVR and Ascending aortic aneurysm replacement. - Post-op echo stable.  6. AKI:  - Resolved.  7. Anemia:  - Hgb 8.6 this am. Transfuse < 8.0.  - s/p 1uPRBCs 07/23/17. 8. Hypokalemia - K 3.1 this am. Supp increased for today. Follow  Length of Stay: [place], [place]  7:28 AM Advanced Heart Failure Team Pager  [phone] (M-F; 7a - 4p)  Please contact [facility] for night-coverage after hours (4p -7a ) and weekends on [email or web address]  Agree with above.   Remains intubated. Awake on vent and following commands. Remains on NE 10 and milrinone 0.25. Remains on IV lasix but weight trending back up. CVP 14-15 . CXR is clearing. Echo EF 65-70% AVR stable. RV ok. + Left pleural effusion. (Personally reviewed). ABG with significant Aa gradient. Rhythm stable.  On exam awake on vent. Agitated Cor RRR Lungs + rhonchi Ab obese soft NT Ext 1+ edema  Continues to recover but failed extubation last week. Agree with need for trach. Continue diuresis. Echo and CXR look good. Maintaining NSr on IV amio. Can switch to po once trached. Supp K. Can decrease milrinone to 0.125. Wean EN as tolerated.   CRITICAL CARE Performed by: Glori Bickers  Total critical care time: 35 minutes  Critical care time was exclusive of separately billable procedures and treating other patients.  Critical care was necessary to treat or prevent imminent or life-threatening deterioration.  Critical care was time spent personally by me (independent of midlevel providers or residents) on the following activities: development of treatment plan with patient and/or surrogate as well as nursing, discussions with consultants, evaluation of patient's response to treatment, examination of patient, obtaining history from patient or surrogate, ordering and  performing treatments and interventions, ordering and review of laboratory studies, ordering and review of radiographic studies, pulse oximetry and re-evaluation of patient's condition.  Glori Bickers, MD  11:34 AM

## 2017-07-24 NOTE — Progress Notes (Signed)
.. ..  Name: [name] MRN: [id number] DOB: 1941/04/17    ADMISSION DATE:  07/10/2017  CONSULTATION DATE:  07/12/17  REFERRING MD :  Nils Pyle MD   BRIEF:   77 year old female with past medical history significant for fusiform ascending aortic aneurysm, hypertension, hyperlipidemia, status post AVR and replacement of ascending aortic aneurysm.  On 07/10/2017 postprocedure patient developed acute cardiogenic shock had an episode of V. fib arrest was taken back to the OR on 07/11/2017 for evacuation of hematoma and placement of RVAD.  PCCM consulted for ventilator management. Failed extubation 1/24  STUDIES: Echo12/12/2016 LVEF 55-60%, Grade 1 DD, Mild/Mod AI with LVOT, Mid ascending aortic diameter 48.88 mm, Mild LAE, RV normal  Culture data 1/24 sputum: few wbc, rare GPC pairs and clusters>>>  abx Ceftaz1/17>>>1.20 Cefuroxime 1/21>>1/23 ceftaz 1/23>>>     Events - 07/10/17 Admit - 07/10/17 S/p AVR and ascending AA replacement  - 07/10/17 VF Arrest in evening with shock x 1 - 07/11/17 to OR for evacuation of hematoma and RVAD placement  - 07/12/17 PCCM consult 07/15/2017 Lasix per cardiology CVP of 5 1/22 heavily diuresed  1/23 ready for extubation 1/24: reintubated 1/25 - Afebrile Intubated & more awake on propofol/fent gtt Higher dose levo gtt Diuresed well with lasix    SUBJECTIVE/OVERNIGHT/INTERVAL HX 1/28 - on fent gtt and diprivan gtt -> TGL high now. On milrionne, amio and levophed gtt. Not on RVAD. CVTS requesting trach.   VITAL SIGNS: Temp:  [98.2 F (36.8 C)-99.6 F (37.6 C)] 98.4 F (36.9 C) (01/28 0743) Pulse Rate:  [76-114] 86 (01/28 0800) Resp:  [15-52] 30 (01/28 0800) BP: (78-121)/(47-76) 111/66 (01/28 0800) SpO2:  [87 %-97 %] 93 % (01/28 0800) Arterial Line BP: (108)/(50) 108/50 (01/27 0900) FiO2 (%):  [40 %] 40 % (01/28 0800) Weight:  [191 lb 9.3 oz (86.9 kg)] 191 lb 9.3 oz (86.9 kg) (01/28 0500)   Filed Weights   07/22/17 0406 07/23/17 0500  07/24/17 0500  Weight: 195 lb 15.8 oz (88.9 kg) 189 lb 13.1 oz (86.1 kg) 191 lb 9.3 oz (86.9 kg)    Intake/Output Summary (Last 24 hours) at 07/24/2017 0853 Last data filed at 07/24/2017 0800 Gross per 24 hour  Intake 3380.92 ml  Output 4292 ml  Net -911.08 ml     PHYSICAL EXAMINATION:  General Appearance:    Looks criticall ill +  Head:    Normocephalic, without obvious abnormality, atraumatic  Eyes:    PERRL - yes, conjunctiva/corneas - clear      Ears:    Normal external ear canals, both ears  Nose:   NG tube - no  Throat:  ETT TUBE - yes , OG tube - yes  Neck:   Supple,  No enlargement/tenderness/nodules     Lungs:     Clear to auscultation bilaterally, Ventilator   Synchrony - mild dysynch due to agitation  Chest wall:    No deformity but has ssurgical scar with staples  Heart:    S1 and S2 normal, no murmur, CVP - no.  Pressors - no  Abdomen:     Soft, no masses, no organomegaly  Genitalia:    Not done  Rectal:   not done  Extremities:   Extremities- inact     Skin:   Intact in exposed areas .      Neurologic:   Sedation - fent gtt and diprivang tt -> RASS - +1 to +2 . Moves all 4s - yes. CAM-ICU - + for  delirium . Orientation - can redirect     PULMONARY Recent Labs  Lab 07/20/17 1215 07/21/17 0331  07/21/17 1556 07/22/17 0334 07/22/17 0359 07/22/17 1627 07/23/17 0146 07/23/17 0322 07/23/17 1602 07/24/17 0257 07/24/17 0340  PHART 7.565* 7.467*  --   --  7.488*  --   --  7.480*  --   --  7.510*  --   PCO2ART 35.5 46.6  --   --  42.4  --   --  43.0  --   --  40.6  --   PO2ART 66.4* 98.9  --   --  76.5*  --   --  75.0*  --   --  59.0*  --   HCO3 32.4* 33.3*  --   --  31.8*  --   --  32.1*  --   --  32.4*  --   TCO2  --   --   --  32  --   --  29 33*  --  32 34*  --   O2SAT 94.6 97.5   < >  --  96.1 64.2  --  96.0 73.0  --  93.0 63.4   < > = values in this interval not displayed.    CBC Recent Labs  Lab 07/22/17 0348  07/23/17 0307 07/23/17 1358  07/23/17 1602 07/24/17 0332  HGB 8.2*   < > 7.6* 8.3* 8.8* 8.6*  HCT 26.4*   < > 24.6* 26.0* 26.0* 28.0*  WBC 17.5*  --  13.8*  --   --  13.5*  PLT 377  --  360  --   --  395   < > = values in this interval not displayed.    COAGULATION Recent Labs  Lab 07/17/17 1144 07/24/17 0332  INR 1.20 1.14    CARDIAC  No results for input(s): TROPONINI in the last 168 hours. No results for input(s): PROBNP in the last 168 hours.   CHEMISTRY Recent Labs  Lab 07/20/17 0902 07/20/17 1510 07/21/17 0300 07/21/17 1541  07/21/17 1957 07/22/17 0348 07/22/17 1627 07/23/17 0307 07/23/17 1602 07/23/17 1741 07/24/17 0332  NA  --  139 137  --    < > 141 138 140 139 139  --  139  K  --  3.3* 2.9*  --    < > 3.7 3.2* 3.4* 3.4* 3.5  --  3.1*  CL  --  94* 94*  --    < > 96* 96* 95* 97* 94*  --  95*  CO2  --  29 28  --   --  31 27  --  28  --   --  29  GLUCOSE  --  85 182*  --    < > 122* 134* 120* 131* 141*  --  106*  BUN  --  21* 28*  --    < > 28* 29* 27* 29* 26*  --  28*  CREATININE  --  0.86 0.93  --    < > 0.91 0.83 0.70 0.77 0.70  --  0.74  CALCIUM  --  6.5* 6.5*  --   --  7.4* 6.9*  --  6.7*  --   --  6.9*  MG 1.7 1.6* 2.0 1.8  --   --   --   --   --   --  1.7 2.2  PHOS 3.9 3.9 3.7 3.4  --   --   --   --   --   --   --   --    < > =  values in this interval not displayed.   Estimated Creatinine Clearance: 66.2 mL/min (by C-G formula based on SCr of 0.74 mg/dL).   LIVER Recent Labs  Lab 07/17/17 1144  07/20/17 0500 07/21/17 0300 07/22/17 0348 07/23/17 0307 07/24/17 0332  AST  --    < > 52* 52* 50* 55* 57*  ALT  --    < > 37 33 35 37 39  ALKPHOS  --    < > 205* 188* 190* 197* 222*  BILITOT  --    < > 2.0* 1.5* 1.1 1.1 1.2  PROT  --    < > 5.4* 5.0* 5.3* 5.5* 5.8*  ALBUMIN  --    < > 2.7* 2.5* 2.7* 2.9* 2.9*  INR 1.20  --   --   --   --   --  1.14   < > = values in this interval not displayed.    INFECTIOUS No results for input(s): LATICACIDVEN, PROCALCITON in the last  168 hours.  ENDOCRINE CBG (last 3)  Recent Labs    07/23/17 2311 07/24/17 0327 07/24/17 0818  GLUCAP 103* 100* 88     IMAGING x48h  - image(s) personally visualized  -   highlighted in bold Dg Chest Port 1 View  Result Date: 07/24/2017 CLINICAL DATA:  Endotracheal tube placement. EXAM: PORTABLE CHEST 1 VIEW COMPARISON:  Radiograph of July 23, 2016. FINDINGS: Stable cardiomediastinal silhouette. Endotracheal and nasogastric tubes are unchanged in position. Right-sided chest tube is unchanged in position without evidence of pneumothorax. Left internal jugular catheter is unchanged in position. Mild bibasilar subsegmental atelectasis is noted. Bony thorax is unremarkable. IMPRESSION: Stable support apparatus. Right-sided chest tube is unchanged without pneumothorax. Mild bibasilar subsegmental atelectasis. Electronically Signed   By: Marijo Conception, M.D.   On: 07/24/2017 07:40   Dg Chest Port 1 View  Result Date: 07/23/2017 CLINICAL DATA:  Followup ventilator support. EXAM: PORTABLE CHEST 1 VIEW COMPARISON:  07/22/2017 FINDINGS: Endotracheal tube tip is 2 cm above the carina. Nasogastric tube and soft feeding tube enters the abdomen. Right arm PICC tip at the SVC RA junction. Right chest tube remains in place. No visible pneumothorax. Improved basilar aeration with some persistent atelectasis left more than right. IMPRESSION: Lines and tubes well positioned. No pneumothorax. Slightly improved basilar aeration. Electronically Signed   By: Nelson Chimes M.D.   On: 07/23/2017 06:46     ASSESSMENT / PLAN: 78 year old female with PMHx significant for fusiform ascending aortic aneurysm, hypertension, hyperlipidemia, status post AVR and replacement of ascending aortic aneurysm.  On 07/10/2017 postprocedure patient developed acute cardiogenic shock had an episode of V. fib arrest was taken back to the OR on 07/11/2017 for evacuation of hematoma and placement of RVAD.  RVAD now removed, failed  extubation 1/24 , now weaning well again  ASSESSMENT / PLAN:  Acute respiratory failure initially in setting of cardiogenic shock, as of 1/24 mechanism seems to be atelectasis, poor cough mechanics, deconditioning +/- HF: Required reintubation  07/24/2017 - > does not meet criteria forExtubation in setting of Acute Respiratory Failure due to circulatory shock, deconditioning and acute encephalopthy   Plan SBTs - tolerates PS 8/5 CVTS requeesting trach - informed operator Dr Nelda Marseille   Cardiogenic shock s/p removal of RVAD 1/21; sternum now closed Atrial fibrillation per tele Medication related hypotension.  Back on levophed since started on propofol  07/24/2017 ->  yes on Pressor milrionine and amiog gtt and milrionne -   Plan Continue levo gtt and inotropic  support ct amiodarone Continue diuresis per CHF service   HCAP  -    Plan Anti-infectives (From admission, onward)   Start     Dose/Rate Route Frequency Ordered Stop   07/19/17 0900  cefTAZidime (FORTAZ) 1 g in dextrose 5 % 50 mL IVPB     1 g 100 mL/hr over 30 Minutes Intravenous Every 8 hours 07/19/17 0827     07/17/17 2030  vancomycin (VANCOCIN) IVPB 1000 mg/200 mL premix     1,000 mg 200 mL/hr over 60 Minutes Intravenous Every 12 hours 07/17/17 1208 07/18/17 1043   07/17/17 2000  cefUROXime (ZINACEF) 1.5 g in dextrose 5 % 50 mL IVPB  Status:  Discontinued     1.5 g 100 mL/hr over 30 Minutes Intravenous Every 12 hours 07/17/17 1208 07/19/17 0827   07/17/17 0905  vancomycin (VANCOCIN) 1,000 mg in sodium chloride 0.9 % 1,000 mL irrigation  Status:  Discontinued       As needed 07/17/17 0907 07/17/17 1122   07/17/17 0730  vancomycin (VANCOCIN) 1,000 mg in sodium chloride 0.9 % 1,000 mL irrigation  Status:  Discontinued      Irrigation To Surgery 07/17/17 0727 07/17/17 1208   07/17/17 0400  vancomycin (VANCOCIN) 1,250 mg in sodium chloride 0.9 % 250 mL IVPB  Status:  Discontinued     1,250 mg 166.7 mL/hr over 90 Minutes  Intravenous To Surgery 07/16/17 1436 07/17/17 1127   07/17/17 0400  cefUROXime (ZINACEF) 1.5 g in dextrose 5 % 50 mL IVPB     1.5 g 100 mL/hr over 30 Minutes Intravenous To Surgery 07/16/17 1436 07/17/17 1130   07/17/17 0400  cefUROXime (ZINACEF) 750 mg in dextrose 5 % 50 mL IVPB  Status:  Discontinued     750 mg 100 mL/hr over 30 Minutes Intravenous To Surgery 07/16/17 1435 07/17/17 1127   07/13/17 0815  cefTAZidime (FORTAZ) 1 g in dextrose 5 % 50 mL IVPB  Status:  Discontinued     1 g 100 mL/hr over 30 Minutes Intravenous Every 8 hours 07/13/17 0802 07/17/17 1208   07/11/17 2000  vancomycin (VANCOCIN) 1,250 mg in sodium chloride 0.9 % 250 mL IVPB  Status:  Discontinued     1,250 mg 166.7 mL/hr over 90 Minutes Intravenous Every 24 hours 07/11/17 1123 07/17/17 1208   07/11/17 1815  vancomycin (VANCOCIN) IVPB 1000 mg/200 mL premix  Status:  Discontinued     1,000 mg 200 mL/hr over 60 Minutes Intravenous  Once 07/11/17 1037 07/11/17 1123   07/11/17 1415  cefUROXime (ZINACEF) 1.5 g in dextrose 5 % 50 mL IVPB  Status:  Discontinued     1.5 g 100 mL/hr over 30 Minutes Intravenous Every 12 hours 07/11/17 1037 07/11/17 1216   07/11/17 1400  cefUROXime (ZINACEF) 1.5 g in dextrose 5 % 50 mL IVPB  Status:  Discontinued     1.5 g 100 mL/hr over 30 Minutes Intravenous Every 12 hours 07/11/17 1050 07/13/17 0803   07/10/17 2100  cefUROXime (ZINACEF) 1.5 g in dextrose 5 % 50 mL IVPB  Status:  Discontinued     1.5 g 100 mL/hr over 30 Minutes Intravenous Every 12 hours 07/10/17 1440 07/11/17 1216   07/10/17 2030  vancomycin (VANCOCIN) IVPB 1000 mg/200 mL premix     1,000 mg 200 mL/hr over 60 Minutes Intravenous  Once 07/10/17 1440 07/10/17 2313   07/10/17 0400  vancomycin (VANCOCIN) 1,250 mg in sodium chloride 0.9 % 250 mL IVPB     1,250 mg 166.7  mL/hr over 90 Minutes Intravenous To Surgery 07/09/17 1324 07/10/17 1514   07/10/17 0400  cefUROXime (ZINACEF) 1.5 g in dextrose 5 % 50 mL IVPB     1.5  g 100 mL/hr over 30 Minutes Intravenous To Surgery 07/09/17 1324 07/10/17 1515   07/10/17 0400  cefUROXime (ZINACEF) 750 mg in dextrose 5 % 50 mL IVPB  Status:  Discontinued     750 mg 100 mL/hr over 30 Minutes Intravenous To Surgery 07/09/17 1324 07/10/17 1440         Acute encephalopathy   -ongoing 07/24/2017 and barrier to extubation. TGL high with diprivan  Plan Continue PAD protocol RASS goal 0 to -1 dc propofol Start precedex  Electrolyte imbalance Plan Replace and recheck  anemia of critical illness; no evidence of bleeding Thrombocytopenia -resolved  Plan Transfuse for Hb 8 or lower - 1 U PRBC 1/27  Hyperglycemia  Plan Sliding scale insulin  FAMILY  - Updates no family at bedside  - Inter-disciplinary family meet or Palliative Care meeting due by: done Current LOS is LOS 14 days  DISPO  DVT prophylaxis: lmwh SUP: PPI  Diet: tubefeeds Disposition : ICU    The patient is critically ill with multiple organ systems failure and requires high complexity decision making for assessment and support, frequent evaluation and titration of therapies, application of advanced monitoring technologies and extensive interpretation of multiple databases.   Critical Care Time devoted to patient care services described in this note is  30  Minutes. This time reflects time of care of this signee Dr Brand Males. This critical care time does not reflect procedure time, or teaching time or supervisory time of PA/NP/Med student/Med Resident etc but could involve care discussion time    Dr. Brand Males, M.D., [facility].C.P Pulmonary and Critical Care Medicine Staff Physician [facility] Pager: [phone], If no answer or between  15:00h - 7:00h: call [phone]  07/24/2017 8:53 AM

## 2017-07-24 NOTE — Progress Notes (Signed)
Inpatient Rehabilitation  Continuing to follow along at a distance for timing of medical readiness and therapy recommendations for post acute rehab.  Call if questions.  Carmelia Roller., CCC/SLP Admission Coordinator  [place]  Cell [phone]

## 2017-07-25 ENCOUNTER — Inpatient Hospital Stay (HOSPITAL_COMMUNITY): Payer: Medicare Other

## 2017-07-25 LAB — GLUCOSE, CAPILLARY
Glucose-Capillary: 186 mg/dL — ABNORMAL HIGH (ref 65–99)
Glucose-Capillary: 80 mg/dL (ref 65–99)
Glucose-Capillary: 108 mg/dL — ABNORMAL HIGH (ref 65–99)
Glucose-Capillary: 110 mg/dL — ABNORMAL HIGH (ref 65–99)
Glucose-Capillary: 157 mg/dL — ABNORMAL HIGH (ref 65–99)
Glucose-Capillary: 162 mg/dL — ABNORMAL HIGH (ref 65–99)

## 2017-07-25 LAB — CBC
HCT: 25.8 % — ABNORMAL LOW (ref 36.0–46.0)
Hemoglobin: 8.1 g/dL — ABNORMAL LOW (ref 12.0–15.0)
MCH: 30.6 pg (ref 26.0–34.0)
MCHC: 31.4 g/dL (ref 30.0–36.0)
MCV: 97.4 fL (ref 78.0–100.0)
Platelets: 366 10*3/uL (ref 150–400)
RBC: 2.65 MIL/uL — ABNORMAL LOW (ref 3.87–5.11)
RDW: 18 % — ABNORMAL HIGH (ref 11.5–15.5)
WBC: 9.8 10*3/uL (ref 4.0–10.5)

## 2017-07-25 LAB — BASIC METABOLIC PANEL
Anion gap: 11 (ref 5–15)
BUN: 24 mg/dL — AB (ref 6–20)
CO2: 28 mmol/L (ref 22–32)
CREATININE: 0.61 mg/dL (ref 0.44–1.00)
Calcium: 6.3 mg/dL — CL (ref 8.9–10.3)
Chloride: 96 mmol/L — ABNORMAL LOW (ref 101–111)
GFR calc Af Amer: >60 mL/min (ref >60)
Glucose, Bld: 167 mg/dL — ABNORMAL HIGH (ref 65–99)
Potassium: 3.7 mmol/L (ref 3.5–5.1)
SODIUM: 135 mmol/L (ref 135–145)

## 2017-07-25 LAB — COOXEMETRY PANEL
Carboxyhemoglobin: 1.3 % (ref 0.5–1.5)
Carboxyhemoglobin: 1.4 % (ref 0.5–1.5)
METHEMOGLOBIN: 1.3 % (ref 0.0–1.5)
Methemoglobin: 1.3 % (ref 0.0–1.5)
O2 SAT: 47.6 %
O2 Saturation: 40.2 %
TOTAL HEMOGLOBIN: 11.3 g/dL — AB (ref 12.0–16.0)
Total hemoglobin: 9.3 g/dL — ABNORMAL LOW (ref 12.0–16.0)

## 2017-07-25 LAB — COMPREHENSIVE METABOLIC PANEL
ALT: 33 U/L (ref 14–54)
AST: 48 U/L — ABNORMAL HIGH (ref 15–41)
Albumin: 3 g/dL — ABNORMAL LOW (ref 3.5–5.0)
Alkaline Phosphatase: 194 U/L — ABNORMAL HIGH (ref 38–126)
Anion gap: 12 (ref 5–15)
BUN: 30 mg/dL — ABNORMAL HIGH (ref 6–20)
CO2: 28 mmol/L (ref 22–32)
Calcium: 6.5 mg/dL — ABNORMAL LOW (ref 8.9–10.3)
Chloride: 98 mmol/L — ABNORMAL LOW (ref 101–111)
Creatinine, Ser: 0.71 mg/dL (ref 0.44–1.00)
GFR calc Af Amer: >60 mL/min (ref >60)
GFR calc non Af Amer: >60 mL/min (ref >60)
Glucose, Bld: 162 mg/dL — ABNORMAL HIGH (ref 65–99)
Potassium: 3.1 mmol/L — ABNORMAL LOW (ref 3.5–5.1)
Sodium: 138 mmol/L (ref 135–145)
Total Bilirubin: 1.5 mg/dL — ABNORMAL HIGH (ref 0.3–1.2)
Total Protein: 5.6 g/dL — ABNORMAL LOW (ref 6.5–8.1)

## 2017-07-25 LAB — CK TOTAL AND CKMB (NOT AT ARMC)
CK TOTAL: 173 U/L (ref 38–234)
CK, MB: 5.4 ng/mL — ABNORMAL HIGH (ref 0.5–5.0)
RELATIVE INDEX: 3.1 — AB (ref 0.0–2.5)

## 2017-07-25 LAB — PROTIME-INR
INR: 1.24
Prothrombin Time: 15.5 seconds — ABNORMAL HIGH (ref 11.4–15.2)

## 2017-07-25 LAB — PREPARE RBC (CROSSMATCH)

## 2017-07-25 LAB — POCT I-STAT 4, (NA,K, GLUC, HGB,HCT)
Glucose, Bld: 109 mg/dL — ABNORMAL HIGH (ref 65–99)
HCT: 30 % — ABNORMAL LOW (ref 36.0–46.0)
Hemoglobin: 10.2 g/dL — ABNORMAL LOW (ref 12.0–15.0)
Potassium: 3.4 mmol/L — ABNORMAL LOW (ref 3.5–5.1)
Sodium: 140 mmol/L (ref 135–145)

## 2017-07-25 LAB — PHOSPHORUS: Phosphorus: 3.2 mg/dL (ref 2.5–4.6)

## 2017-07-25 LAB — APTT: aPTT: 34 s (ref 24–36)

## 2017-07-25 LAB — MAGNESIUM: Magnesium: 1.8 mg/dL (ref 1.7–2.4)

## 2017-07-25 MED ORDER — POTASSIUM CHLORIDE 10 MEQ/100ML IV SOLN: 10.0000 meq | INTRAVENOUS | Status: DC

## 2017-07-25 MED ORDER — FENTANYL CITRATE (PF) 100 MCG/2ML IJ SOLN
100.0000 ug | INTRAMUSCULAR | Status: DC | PRN
  Administered 2017-07-25: 100 ug via INTRAVENOUS
  Filled 2017-07-25: qty 2

## 2017-07-25 MED ORDER — LEVALBUTEROL HCL 1.25 MG/0.5ML IN NEBU
1.2500 mg | INHALATION_SOLUTION | Freq: Four times a day (QID) | RESPIRATORY_TRACT | Status: DC
  Administered 2017-07-25 – 2017-08-01 (×30): 1.25 mg via RESPIRATORY_TRACT
  Filled 2017-07-25 (×29): qty 0.5

## 2017-07-25 MED ORDER — CLONAZEPAM 0.5 MG PO TABS: 0.2500 mg | ORAL_TABLET | Freq: Two times a day (BID) | ORAL | Status: DC

## 2017-07-25 MED ORDER — POTASSIUM CHLORIDE 10 MEQ/50ML IV SOLN
10.0000 meq | INTRAVENOUS | Status: AC
  Administered 2017-07-25 (×2): 10 meq via INTRAVENOUS
  Filled 2017-07-25: qty 50

## 2017-07-25 MED ORDER — CLONAZEPAM 0.125 MG PO TBDP
0.2500 mg | ORAL_TABLET | Freq: Two times a day (BID) | ORAL | Status: DC
  Administered 2017-07-25 – 2017-07-26 (×3): 0.25 mg via ORAL
  Filled 2017-07-25 (×3): qty 2

## 2017-07-25 MED ORDER — POTASSIUM CHLORIDE 10 MEQ/50ML IV SOLN
10.0000 meq | INTRAVENOUS | Status: AC
  Administered 2017-07-25 (×3): 10 meq via INTRAVENOUS
  Filled 2017-07-25: qty 50

## 2017-07-25 MED ORDER — POTASSIUM CHLORIDE 10 MEQ/50ML IV SOLN
10.0000 meq | INTRAVENOUS | Status: AC
  Administered 2017-07-25 (×3): 10 meq via INTRAVENOUS
  Filled 2017-07-25 (×3): qty 50

## 2017-07-25 MED ORDER — SODIUM CHLORIDE 0.9 % IV SOLN
2.0000 g | Freq: Once | INTRAVENOUS | Status: AC
  Administered 2017-07-25: 2 g via INTRAVENOUS
  Filled 2017-07-25: qty 20

## 2017-07-25 MED ORDER — EMPTY CONTAINERS FLEXIBLE MISC
25.0000 ug/h | Status: DC
  Administered 2017-07-25: 100 ug/h via INTRAVENOUS
  Administered 2017-07-26 (×2): 400 ug/h via INTRAVENOUS
  Administered 2017-07-27: 350 ug/h via INTRAVENOUS
  Administered 2017-07-27 – 2017-07-28 (×2): 400 ug/h via INTRAVENOUS
  Filled 2017-07-25 (×6): qty 100

## 2017-07-25 MED ORDER — MAGNESIUM SULFATE 2 GM/50ML IV SOLN
2.0000 g | Freq: Once | INTRAVENOUS | Status: AC
Start: 2017-07-25 — End: 2017-07-25
  Administered 2017-07-25: 2 g via INTRAVENOUS
  Filled 2017-07-25: qty 50

## 2017-07-25 MED ORDER — VITAL 1.5 CAL PO LIQD
1000.0000 mL | ORAL | Status: DC
  Administered 2017-07-26: 1000 mL
  Filled 2017-07-25: qty 1000

## 2017-07-25 MED ORDER — LEVALBUTEROL HCL 1.25 MG/0.5ML IN NEBU: 1.2500 mg | INHALATION_SOLUTION | Freq: Three times a day (TID) | RESPIRATORY_TRACT | Status: DC

## 2017-07-25 MED ORDER — FENTANYL CITRATE (PF) 2500 MCG/50ML IJ SOLN
25.0000 ug/h | Status: DC
  Filled 2017-07-25: qty 100

## 2017-07-25 MED ORDER — GERHARDT'S BUTT CREAM
TOPICAL_CREAM | Freq: Two times a day (BID) | CUTANEOUS | Status: DC
  Administered 2017-07-25: 18:00:00 via TOPICAL
  Administered 2017-07-26: 1 via TOPICAL
  Administered 2017-07-26 – 2017-07-27 (×3): via TOPICAL
  Administered 2017-07-28: 1 via TOPICAL
  Administered 2017-07-28 – 2017-07-30 (×4): via TOPICAL
  Administered 2017-07-30: 1 via TOPICAL
  Administered 2017-07-31 – 2017-08-06 (×13): via TOPICAL
  Administered 2017-08-06: 1 via TOPICAL
  Administered 2017-08-07: 21:00:00 via TOPICAL
  Administered 2017-08-07: 1 via TOPICAL
  Administered 2017-08-08 – 2017-08-10 (×6): via TOPICAL
  Administered 2017-08-11: 1 via TOPICAL
  Administered 2017-08-11 – 2017-08-12 (×2): via TOPICAL
  Filled 2017-07-25 (×2): qty 1

## 2017-07-25 NOTE — Procedures (Signed)
Bedside Bronch for trach placement  At first bronch was introduced through ET tube and structures of tracheal rings, carina identified for operator of tracheostomy who was Dr. Nelda Marseille. Under bronchoscopy guidance,  ET tube was pulled back sufficiently and very carefully. The ET tube was  pulled back enough to give room for tracheostomy operator and yet at same time to to ensure a secured airway. After this was accomplished, bronchoscope was withdrawn into the ET tube. After this,  Dr Nelda Marseille then performed tracheostomy under video visual provided by flexible video bronchoscopy. Followng introduction of tracheostomy,  the bronchoscope was removed from ET tube and introduced through tracheostomy. Correct position of tracheostomy was ensured, with enough room between carina and distal tracheostomy and no evidence of bleeding. The bronchoscope was then withdrawn. Respiratory therapist was then instructed to remove the ET tube.  Dr Nelda Marseille then proceeded to complete the tracheostomy with stay sutures.  No complications.  Marshell Garfinkel MD [facility] Pager [phone] If no answer or after 3pm call: [phone] 07/25/2017, 11:21 AM

## 2017-07-25 NOTE — Progress Notes (Signed)
[place] Progress Note Patient Name: [name] DOB: Mar 27, 1941 MRN: [id number]   Date of Service  07/25/2017  HPI/Events of Note  Hypocalcemia - Ca++ = 6.3   eICU Interventions  Will replace Ca++.     Intervention Category Major Interventions: Electrolyte abnormality - evaluation and management  Devinne Epstein Eugene 07/25/2017, 10:36 PM

## 2017-07-25 NOTE — Progress Notes (Signed)
CRITICAL VALUE ALERT  Critical Value:  Calcium 6.3  Date & Time Notied:  07/25/17 @ 2230  Provider Notified: eLink notified  Orders Received/Actions taken: Orders received for calcium gluconate IVPB

## 2017-07-25 NOTE — Significant Event (Signed)
Patient continues to be very agitated and restless when awake. Patient trying to get out of bed and throwing legs against the bed and sider-rails. PRN fentanyl and versed have been given as needed. RN called CCM and spoke with [name], who agreed that patient can go back to fentanyl drip at this time. Per NP, RN can enter this order in Epic, as he is unable to at this time.     Sheika Coutts

## 2017-07-25 NOTE — Progress Notes (Signed)
..  Name: [name] MRN: [id number] DOB: December 04, 1940    ADMISSION DATE:  07/10/2017  CONSULTATION DATE:  07/12/17  REFERRING MD :  Nils Pyle MD   BRIEF:   77 year old female with past medical history significant for fusiform ascending aortic aneurysm, hypertension, hyperlipidemia, status post AVR and replacement of ascending aortic aneurysm.  On 07/10/2017 postprocedure patient developed acute cardiogenic shock had an episode of V. fib arrest was taken back to the OR on 07/11/2017 for evacuation of hematoma and placement of RVAD.  PCCM consulted for ventilator management.   STUDIES: Echo12/12/2016 LVEF 55-60%, Grade 1 DD, Mild/Mod AI with LVOT, Mid ascending aortic diameter 48.88 mm, Mild LAE, RV normal Echo (07/22/17): EF 65-70%, bioprosthetic aortic valve functioning normally, PASP 33 mmHg, RV looks ok.   Culture data 1/24 sputum: few wbc, rare GPC pairs and clusters>>>  abx Ceftaz1/17>>>1.20 Cefuroxime 1/21>>1/23 ceftaz 1/23>>>  Events - 07/10/17 Admit - 07/10/17 S/p AVR and ascending AA replacement  - 07/10/17 VF Arrest in evening with shock x 1 - 07/11/17 to OR for evacuation of hematoma and RVAD placement  - 07/12/17 PCCM consult 07/15/2017 Lasix per cardiology CVP of 5 1/21 RVAD removed 1/22 heavily diuresed  1/23 ready for extubation. Atrial fib amio started.  1/24: reintubated 1/25 - Afebrile 1/29 - trach placed   SUBJECTIVE/OVERNIGHT/INTERVAL HX Stable overnight, no acute issues.   VITAL SIGNS: Temp:  [98.3 F (36.8 C)-99.3 F (37.4 C)] 99.3 F (37.4 C) (01/29 0721) Pulse Rate:  [70-93] 83 (01/29 0900) Resp:  [8-38] 27 (01/29 0900) BP: (86-138)/(45-90) 113/45 (01/29 0900) SpO2:  [90 %-100 %] 100 % (01/29 0900) FiO2 (%):  [40 %-100 %] 100 % (01/29 0900) Weight:  [86.1 kg (189 lb 13.1 oz)] 86.1 kg (189 lb 13.1 oz) (01/29 0200)   Filed Weights   07/23/17 0500 07/24/17 0500 07/25/17 0200  Weight: 86.1 kg (189 lb 13.1 oz) 86.9 kg (191 lb 9.3 oz) 86.1 kg (189  lb 13.1 oz)    Intake/Output Summary (Last 24 hours) at 07/25/2017 1601 Last data filed at 07/25/2017 0900 Gross per 24 hour  Intake 3361.08 ml  Output 3660 ml  Net -298.92 ml     PHYSICAL EXAMINATION: General:  Elderly female in NAD Neuro:  Sedated on vent HEENT:  [place]/AT, No JVD noted, PERRL Cardiovascular:  RRR, no MRG Lungs:  Coarse bilateral vent supported breaths.  Abdomen:  Soft, non-distended Musculoskeletal:  No acute deformity Skin:  Sternotomy incision closed, no s/s infection.   PULMONARY Recent Labs  Lab 07/21/17 0331  07/22/17 0334  07/22/17 1627 07/23/17 0146  07/23/17 1602 07/24/17 0257 07/24/17 0340 07/24/17 0910 07/25/17 0400 07/25/17 0740  PHART 7.467*  --  7.488*  --   --  7.480*  --   --  7.510*  --  7.520*  --   --   PCO2ART 46.6  --  42.4  --   --  43.0  --   --  40.6  --  38.6  --   --   PO2ART 98.9  --  76.5*  --   --  75.0*  --   --  59.0*  --  61.0*  --   --   HCO3 33.3*  --  31.8*  --   --  32.1*  --   --  32.4*  --  31.5*  --   --   TCO2  --    < >  --   --  29 33*  --  32 34*  --  33*  --   --   O2SAT 97.5   < > 96.1   < >  --  96.0   < >  --  93.0 63.4 93.0 40.2 47.6   < > = values in this interval not displayed.    CBC Recent Labs  Lab 07/23/17 0307  07/24/17 0332 07/24/17 1619 07/25/17 0350  HGB 7.6*   < > 8.6* 8.8* 8.1*  HCT 24.6*   < > 28.0* 26.0* 25.8*  WBC 13.8*  --  13.5*  --  9.8  PLT 360  --  395  --  366   < > = values in this interval not displayed.    COAGULATION Recent Labs  Lab 07/24/17 0332 07/25/17 0350  INR 1.14 1.24    CARDIAC  No results for input(s): TROPONINI in the last 168 hours. No results for input(s): PROBNP in the last 168 hours.   CHEMISTRY Recent Labs  Lab 07/20/17 0902 07/20/17 1510 07/21/17 0300 07/21/17 1541  07/21/17 1957 07/22/17 0348 07/22/17 1627 07/23/17 0307 07/23/17 1602 07/23/17 1741 07/24/17 0332 07/24/17 1619 07/25/17 0350  NA  --  139 137  --    < > 141 138 140  139 139  --  139 139 138  K  --  3.3* 2.9*  --    < > 3.7 3.2* 3.4* 3.4* 3.5  --  3.1* 3.4* 3.1*  CL  --  94* 94*  --    < > 96* 96* 95* 97* 94*  --  95*  --  98*  CO2  --  29 28  --   --  31 27  --  28  --   --  29  --  28  GLUCOSE  --  85 182*  --    < > 122* 134* 120* 131* 141*  --  106* 98 162*  BUN  --  21* 28*  --    < > 28* 29* 27* 29* 26*  --  28*  --  30*  CREATININE  --  0.86 0.93  --    < > 0.91 0.83 0.70 0.77 0.70  --  0.74  --  0.71  CALCIUM  --  6.5* 6.5*  --   --  7.4* 6.9*  --  6.7*  --   --  6.9*  --  6.5*  MG 1.7 1.6* 2.0 1.8  --   --   --   --   --   --  1.7 2.2  --  1.8  PHOS 3.9 3.9 3.7 3.4  --   --   --   --   --   --   --   --   --  3.2   < > = values in this interval not displayed.   Estimated Creatinine Clearance: 65.8 mL/min (by C-G formula based on SCr of 0.71 mg/dL).   LIVER Recent Labs  Lab 07/21/17 0300 07/22/17 0348 07/23/17 0307 07/24/17 0332 07/25/17 0350  AST 52* 50* 55* 57* 48*  ALT 33 35 37 39 33  ALKPHOS 188* 190* 197* 222* 194*  BILITOT 1.5* 1.1 1.1 1.2 1.5*  PROT 5.0* 5.3* 5.5* 5.8* 5.6*  ALBUMIN 2.5* 2.7* 2.9* 2.9* 3.0*  INR  --   --   --  1.14 1.24    INFECTIOUS Recent Labs  Lab 07/24/17 2300  LATICACIDVEN 0.7    ENDOCRINE CBG (last 3)  Recent Labs  07/24/17 2308 07/25/17 0357 07/25/17 0753  GLUCAP 144* 186* 80     IMAGING x48h  - image(s) personally visualized  -   highlighted in bold Dg Chest Port 1 View  Result Date: 07/25/2017 CLINICAL DATA:  Two weeks postop, possible insertion of tracheostomy today EXAM: PORTABLE CHEST 1 VIEW COMPARISON:  Portable chest x-ray of 07/24/2017 FINDINGS: The tip of the endotracheal tube is only 1.7 cm above the carina. There is slightly improved aeration of the lungs although areas of atelectasis persist. Mild pulmonary vascular congestion cannot be excluded, and there may be a tiny left pleural effusion present. Mild cardiomegaly is stable. Right and left central venous lines are  unchanged in position. NG tube extends into the fundus of the stomach. Feeding tube also is present. IMPRESSION: 1. Tip of endotracheal tube only 1.7 cm above the carina. 2. Slightly improved aeration with persistent basilar atelectasis. Cannot exclude mild pulmonary vascular congestion. Electronically Signed   By: Ivar Drape M.D.   On: 07/25/2017 08:42   Dg Chest Port 1 View  Result Date: 07/24/2017 CLINICAL DATA:  Endotracheal tube placement. EXAM: PORTABLE CHEST 1 VIEW COMPARISON:  Radiograph of July 23, 2016. FINDINGS: Stable cardiomediastinal silhouette. Endotracheal and nasogastric tubes are unchanged in position. Right-sided chest tube is unchanged in position without evidence of pneumothorax. Left internal jugular catheter is unchanged in position. Mild bibasilar subsegmental atelectasis is noted. Bony thorax is unremarkable. IMPRESSION: Stable support apparatus. Right-sided chest tube is unchanged without pneumothorax. Mild bibasilar subsegmental atelectasis. Electronically Signed   By: Marijo Conception, M.D.   On: 07/24/2017 07:40     ASSESSMENT / PLAN: 78 year old female with PMHx significant for fusiform ascending aortic aneurysm, hypertension, hyperlipidemia, status post AVR and replacement of ascending aortic aneurysm.  On 07/10/2017 postprocedure patient developed acute cardiogenic shock had an episode of V. fib arrest was taken back to the OR on 07/11/2017 for evacuation of hematoma and placement of RVAD.  RVAD now removed, failed extubation 1/24 but continued to wean well. Underwent tracheostomy 1/29.  ASSESSMENT / PLAN:  Acute respiratory failure initially in setting of cardiogenic shock, currently mechanism seems to be atelectasis, shock, poor cough mechanics, deconditioning +/- HF - Tracheostomy placed - Will plan to minimize sedation and allow PS weaning today.   Cardiogenic shock s/p removal of RVAD 1/21; sternum now closed Atrial fibrillation Medication related hypotension.   Back on levophed since started on propofol VF arrest, brief, immediately post op - no further incidence. - Pressors, diuresis per Heart Failure service. - Amiodarone - CVTS primary  HCAP/VAP  - Continue ABX as above.  - Low threshold to DC: afebrile, WBC wnl. Will check PCT and if low/downtrended significantly, will DC abx 1/30. This would also represent a 7 day course of Ceftaz.   Acute encephalopathy -Continue PAD protocol RASS goal 0 to -1 DC propofol Precedex infusion wean as tolerated to hopefully off today. Will allow her to rest for the remainder of the morning, then DC fent gtt in favor PRN.   PRN fentanyl, low dose versed  FAMILY  - Updates: no family available.  - Inter-disciplinary family meet or Palliative Care meeting due by: done   Georgann Housekeeper, AGACNP-BC [facility] Pulmonology/Critical Care Pager [phone] or [phone]  07/25/2017 9:44 AM

## 2017-07-25 NOTE — Progress Notes (Signed)
Patient ID: [name], female   DOB: [birth year], 77 y.o.   MRN: [id number]     Advanced Heart Failure Rounding Note  Primary Cardiologist: Dr. Percival Spanish (2014) HF: (New) Dr. Haroldine Laws   Subjective:    Events - 07/10/17 S/p AVR and ascending AA replacement  - 07/10/17 VF Arrest in evening with shock x 1 - 07/11/17 RVAD placement  - 07/17/2017 RVAD removed with chest closed -1/23 Extubated. Went in A fib. Amio started.  -1/24 Reintubated.   Coox 40.2% on Milrinone 0.125 mcg/kg/min and Norepi 10. CVP 18. Weight down 2 lbs.   Remains intubated. Awake. Follows commands but intermittently agitated.  Hgb 8.1 this am. (Last transfusion 07/23/17)  Echo 06/02/2017 LVEF 55-60%, Grade 1 DD, Mild/Mod AI with LVOT, Mid ascending aortic diameter 48.88 mm, Mild LAE, RV normal  Echo (07/22/17): EF 65-70%, bioprosthetic aortic valve functioning normally, PASP 33 mmHg, RV looks ok.   Objective:   Weight Range: 189 lb 13.1 oz (86.1 kg) Body mass index is 31.59 kg/m.   Vital Signs:   Temp:  [98.3 F (36.8 C)-99.1 F (37.3 C)] 99.1 F (37.3 C) (01/29 0351) Pulse Rate:  [70-93] 74 (01/29 0719) Resp:  [8-38] 26 (01/29 0719) BP: (86-138)/(51-73) 93/52 (01/29 0719) SpO2:  [90 %-100 %] 95 % (01/29 0721) FiO2 (%):  [40 %] 40 % (01/29 0721) Weight:  [189 lb 13.1 oz (86.1 kg)] 189 lb 13.1 oz (86.1 kg) (01/29 0200) Last BM Date: 07/24/17  Weight change: Filed Weights   07/23/17 0500 07/24/17 0500 07/25/17 0200  Weight: 189 lb 13.1 oz (86.1 kg) 191 lb 9.3 oz (86.9 kg) 189 lb 13.1 oz (86.1 kg)    Intake/Output:   Intake/Output Summary (Last 24 hours) at 07/25/2017 0727 Last data filed at 07/25/2017 0700 Gross per 24 hour  Intake 3372.95 ml  Output 3700 ml  Net -327.05 ml    Physical Exam  CVP 18  General: Intubated.  HEENT: + ETT Neck: Supple. JVP to jaw. Carotids 2+ bilat; no bruits. No thyromegaly or nodule noted. Cor: PMI nondisplaced. RRR, 1/6 early SEM RUSB.  Lungs: Diminished  basilar sounds Abdomen: Soft, non-tender, non-distended, no HSM. No bruits or masses. +BS  Extremities: No cyanosis, clubbing, or rash. Trace ankle edema Neuro: Intubated   Telemetry   NSR 80-90s, personally reviewed.   EKG    No new tracings.    Labs    CBC Recent Labs    07/24/17 0332 07/24/17 1619 07/25/17 0350  WBC 13.5*  --  9.8  HGB 8.6* 8.8* 8.1*  HCT 28.0* 26.0* 25.8*  MCV 95.9  --  97.4  PLT 395  --  500   Basic Metabolic Panel Recent Labs    07/24/17 0332 07/24/17 1619 07/25/17 0350  NA 139 139 138  K 3.1* 3.4* 3.1*  CL 95*  --  98*  CO2 29  --  28  GLUCOSE 106* 98 162*  BUN 28*  --  30*  CREATININE 0.74  --  0.71  CALCIUM 6.9*  --  6.5*  MG 2.2  --  1.8  PHOS  --   --  3.2   Liver Function Tests Recent Labs    07/24/17 0332 07/25/17 0350  AST 57* 48*  ALT 39 33  ALKPHOS 222* 194*  BILITOT 1.2 1.5*  PROT 5.8* 5.6*  ALBUMIN 2.9* 3.0*   No results for input(s): LIPASE, AMYLASE in the last 72 hours. Cardiac Enzymes Recent Labs    07/25/17 0350  CKTOTAL 173  CKMB 5.4*    BNP: BNP (last 3 results) No results for input(s): BNP in the last 8760 hours.  ProBNP (last 3 results) No results for input(s): PROBNP in the last 8760 hours.   D-Dimer No results for input(s): DDIMER in the last 72 hours. Hemoglobin A1C No results for input(s): HGBA1C in the last 72 hours. Fasting Lipid Panel Recent Labs    07/23/17 1358  TRIG 530*   Thyroid Function Tests No results for input(s): TSH, T4TOTAL, T3FREE, THYROIDAB in the last 72 hours.  Invalid input(s): FREET3  Other results:   Imaging    Dg Chest Port 1 View  Result Date: 07/24/2017 CLINICAL DATA:  Endotracheal tube placement. EXAM: PORTABLE CHEST 1 VIEW COMPARISON:  Radiograph of July 23, 2016. FINDINGS: Stable cardiomediastinal silhouette. Endotracheal and nasogastric tubes are unchanged in position. Right-sided chest tube is unchanged in position without evidence of  pneumothorax. Left internal jugular catheter is unchanged in position. Mild bibasilar subsegmental atelectasis is noted. Bony thorax is unremarkable. IMPRESSION: Stable support apparatus. Right-sided chest tube is unchanged without pneumothorax. Mild bibasilar subsegmental atelectasis. Electronically Signed   By: Marijo Conception, M.D.   On: 07/24/2017 07:40    Medications:    Scheduled Medications: . acetaminophen (TYLENOL) oral liquid 160 mg/5 mL  650 mg Per Tube Once   Or  . acetaminophen  650 mg Rectal Once  . arformoterol  15 mcg Nebulization BID  . aspirin EC  325 mg Oral Daily   Or  . aspirin  324 mg Per Tube Daily  . bisacodyl  10 mg Oral Daily   Or  . bisacodyl  10 mg Rectal Daily  . budesonide (PULMICORT) nebulizer solution  0.5 mg Nebulization BID  . chlorhexidine gluconate (MEDLINE KIT)  15 mL Mouth Rinse BID  . Chlorhexidine Gluconate Cloth  6 each Topical Daily  . docusate  200 mg Per Tube Daily  . enoxaparin (LOVENOX) injection  40 mg Subcutaneous Q24H  . etomidate  40 mg Intravenous Once  . feeding supplement (PRO-STAT SUGAR FREE 64)  30 mL Per Tube BID  . feeding supplement (VITAL AF 1.2 CAL)  1,000 mL Per Tube Q24H  . fentaNYL (SUBLIMAZE) injection  200 mcg Intravenous Once  . furosemide  80 mg Intravenous BID  . insulin aspart  0-24 Units Subcutaneous Q4H  . insulin detemir  10 Units Subcutaneous BID  . latanoprost  1 drop Both Eyes QHS  . levalbuterol  1.25 mg Nebulization Q6H  . mouth rinse  15 mL Mouth Rinse 10 times per day  . midazolam  4 mg Intravenous Once  . naphazoline-glycerin  1-2 drop Both Eyes BID  . pantoprazole (PROTONIX) IV  40 mg Intravenous Q12H  . potassium chloride  40 mEq Per Tube BID  . QUEtiapine  100 mg Per Tube QHS  . sodium chloride flush  10-40 mL Intracatheter Q12H  . sodium chloride flush  10-40 mL Intracatheter Q12H  . sodium chloride flush  3 mL Intravenous Q12H  . vecuronium  10 mg Intravenous Once  . venlafaxine XR  75 mg Oral  Daily    Infusions: . sodium chloride 250 mL (07/22/17 1820)  . amiodarone 30 mg/hr (07/25/17 0700)  . calcium chloride    . cefTAZidime (FORTAZ)  IV Stopped (07/25/17 0130)  . dexmedetomidine (PRECEDEX) IV infusion 1.2 mcg/kg/hr (07/25/17 0700)  . fentaNYL infusion INTRAVENOUS 400 mcg/hr (07/25/17 0700)  . lactated ringers Stopped (07/17/17 1200)  . milrinone 0.125 mcg/kg/min (  07/25/17 0700)  . norepinephrine (LEVOPHED) Adult infusion 10 mcg/min (07/25/17 0700)  . potassium chloride 10 mEq (07/25/17 0700)  . propofol      PRN Medications: fentaNYL, fentaNYL (SUBLIMAZE) injection, metoprolol tartrate, midazolam, ondansetron (ZOFRAN) IV, sodium chloride flush    Patient Profile   [name] is a 77 y.o. female with history of fusiform ascending aortic aneurysm, HTN, and HLD.   Admitted 07/10/17 for planned AVR and replacement of ascending aortic aneurysm. Developed acute cardiogenic shock + VF arrest. Taken back to OR am of 07/11/17 for evacuation of hematoma and placement of RVAD.   Assessment/Plan   1. Cardiogenic shock s/p cardiac surgery with R>L CHF:  Pre-op Echo 06/02/2017 LVEF 55-60%, Grade 1 DD, Mild/Mod AI with LVOT, Mid ascending aortic diameter 48.88 mm, Mild LAE, RV normal.  Post-op shock, RVAD placed now s/p RVAD removal 07/17/2017.  Echo 1/26 with EF 65-70%, bioprosthetic aortic valve, improved RV function.  - Coox 40.2% this am on milrinone 0.125 mcg and NE 10 mcg. CVP 18 - Volume status remains elevated.  - Continue lasix 80 mg IV BID - RV function looked better on echo 07/22/16.  2. VF arrest:  - Stable. No further. Remains on amiodarone gtt.  - Supplement K. Goal K > 4.0 and Mg > 2.0 - Supp Mg with 2 g.  3. Atrial fibrillation:  - Remains in NSR on amiodarone.  - Continue amio gtt while on milrinone. - Currently only getting DVT prophylaxis. 4. Acute respiratory failure: Reintubated 1/24.   - Tracheostomy planned for this am. Suspect HCAP. - Continues on  ceftazidime per CCM.  5. S/p AVR and Ascending aortic aneurysm replacement. - Post-op echo stable.  6. AKI:  - Resolved.  7. Anemia:  - Hgb 8.1 this am. Transfuse < 8.0.  - s/p 1uPRBCs 07/23/17. 8. Hypokalemia - K 3.1 this am. Continue to supp.  - Does not appear to be absorbing po. Will supp with IV K today.   Length of Stay: [place], [place]  7:27 AM Advanced Heart Failure Team Pager [phone] (M-F; 7a - 4p)  Please contact [facility] for night-coverage after hours (4p -7a ) and weekends on [email or web address]  Agree with above.   On exam awake on vent. S/p trach Cor RRR Lungs rhonchi Ab sof NT Ext 2+ edema   Remains tenuous. Underwent trach this am. Awake on vent. Follows commands. Remains on NE 10 and milrinone 0.25 (was decreased to 0.125 but increased back up due to low co-ox). CVP 14. Echo with normal LV and RV function.  Will wean pressors slowly as tolerated. Continue diuresis. Will likely have slow vent wean via trach. Low threshold to transfuse again. Supp K and mag. Rhythm stable on amio. Can switch to po.   CRITICAL CARE Performed by: Glori Bickers  Total critical care time: 35 minutes  Critical care time was exclusive of separately billable procedures and treating other patients.  Critical care was necessary to treat or prevent imminent or life-threatening deterioration.  Critical care was time spent personally by me (independent of midlevel providers or residents) on the following activities: development of treatment plan with patient and/or surrogate as well as nursing, discussions with consultants, evaluation of patient's response to treatment, examination of patient, obtaining history from patient or surrogate, ordering and performing treatments and interventions, ordering and review of laboratory studies, ordering and review of radiographic studies, pulse oximetry and re-evaluation of patient's condition.  Glori Bickers, MD  12:24  PM   

## 2017-07-25 NOTE — Progress Notes (Signed)
PT Cancellation Note  Patient Details Name: PRECILLA PURNELL MRN: [id number] DOB: 03/13/1941   Cancelled Treatment:    Reason Eval/Treat Not Completed: Other (comment). Pt had trach placed this morning, remains on vent support with significant fatigue limiting ability to participate. Will follow-up for PT treatment tomorrow when appropriate.  Mabeline Caras, PT, DPT Acute Rehab Services  Pager: [place] 07/25/2017, 10:54 AM

## 2017-07-25 NOTE — Progress Notes (Signed)
Patient ID: [name], female   DOB: 04/01/1941, 77 y.o.   MRN: [id number] EVENING ROUNDS NOTE :     [address]       [place] [address]             [phone]                 8 Days Post-Op Procedure(s) (LRB): REMOVAL OF RVAD WITH PUMP STANDBY (N/A) TRANSESOPHAGEAL ECHOCARDIOGRAM (TEE) (N/A)  Total Length of Stay:  LOS: 15 days  BP 118/61 (BP Location: Left Arm)   Pulse 70   Temp 99.7 F (37.6 C) (Axillary)   Resp (!) 23   Ht 5\' 5"  (1.651 m)   Wt 189 lb 13.1 oz (86.1 kg)   SpO2 93%   BMI 31.59 kg/m   .Intake/Output      01/28 0701 - 01/29 0700 01/29 0701 - 01/30 0700   P.O.     I.V. (mL/kg) 2136.5 (24.8) 718.6 (8.3)   Blood  345   NG/GT 586.5 120   IV Piggyback 650 300   Total Intake(mL/kg) 3373 (39.2) 1483.6 (17.2)   Urine (mL/kg/hr) 3435 (1.7) 1765 (1.8)   Emesis/NG output 260 0   Stool 0    Chest Tube 5    Total Output 3700 1765   Net -327.1 -281.4        Stool Occurrence 1 x      . sodium chloride 250 mL (07/22/17 1820)  . amiodarone 30 mg/hr (07/25/17 1819)  . calcium chloride    . cefTAZidime (FORTAZ)  IV Stopped (07/25/17 1809)  . dexmedetomidine (PRECEDEX) IV infusion 1.2 mcg/kg/hr (07/25/17 1800)  . fentaNYL infusion INTRAVENOUS 300 mcg/hr (07/25/17 1817)  . lactated ringers Stopped (07/17/17 1200)  . milrinone 0.25 mcg/kg/min (07/25/17 1800)  . norepinephrine (LEVOPHED) Adult infusion 8 mcg/min (07/25/17 1800)  . potassium chloride 10 mEq (07/25/17 1801)     Lab Results  Component Value Date   WBC 9.8 07/25/2017   HGB 10.2 (L) 07/25/2017   HCT 30.0 (L) 07/25/2017   PLT 366 07/25/2017   GLUCOSE 109 (H) 07/25/2017   CHOL 165 03/15/2017   TRIG 530 (H) 07/23/2017   HDL 57 03/15/2017   LDLCALC 89 03/15/2017   ALT 33 07/25/2017   AST 48 (H) 07/25/2017   NA 140 07/25/2017   K 3.4 (L) 07/25/2017   CL 98 (L) 07/25/2017   CREATININE 0.71 07/25/2017   BUN 30 (H) 07/25/2017   CO2 28 07/25/2017   TSH 0.990 03/15/2017   INR 1.24 07/25/2017   HGBA1C 5.2 07/06/2017   Patient now with trach in place Remains on ventilator  Grace Isaac MD  [place] Office [phone] 07/25/2017 6:42 PM

## 2017-07-25 NOTE — Significant Event (Signed)
Patient remains agitated and restless when awake and do reach for her lines and trach. Fentanyl drip has been off, and remains on precedex drip. When awake, patient keeps throwing and pushing her legs against the bed, scooting down in the bed and trying to get out of bed. Patient now sustain a large skin tear on the posterior of her right lower leg from shearing it against the bed at 1450pm. She had pillows under her legs, but would kick it off the bed. Skin tear covered with foam-CCM [name] made aware of this and discuss needs to keep the sedations going for patient's safety. Bed is padded with pillows under her legs. Patient just received fentanyl and versed pushes, and is resting now. Family (spouse and sisters) updated and are aware of the skin tears on her legs.  [name]

## 2017-07-25 NOTE — Procedures (Signed)
Percutaneous Tracheostomy Placement  Consent from family.  Patient sedated, paralyzed and position.  Placed on 100% FiO2 and RR matched.  Area cleaned and draped.  Lidocaine/epi injected.  Skin incision done followed by blunt dissection.  Trachea palpated then punctured, catheter passed and visualized bronchoscopically.  Wire placed and visualized.  Catheter removed.  Airway then crushed and dilated.  Size 6 cuffed shiley trach placed and visualized bronchoscopically well above carina.  Good volume returns.  Patient tolerated the procedure well without complications.  Minimal blood loss.  CXR ordered and pending.  Wesam G. Yacoub, M.D.  Pulmonary/Critical Care Medicine. Pager: [phone]. After hours pager: [phone]. 

## 2017-07-25 NOTE — Progress Notes (Signed)
[place] Progress Note Patient Name: TAHNEE CIFUENTES DOB: 1941/04/29 MRN: [id number]   Date of Service  07/25/2017  HPI/Events of Note  Delirium/Agitation - Currently on Precedex and Fentanyl IV infusions at ceiling and Versed IV PRN. BP = 121/68.   eICU Interventions  Will order: 1. Increase ceiling on Precedex IV infusion to 1.7 mcg/kg/hour.      Intervention Category Major Interventions: Delirium, psychosis, severe agitation - evaluation and management  Sommer,Steven Eugene 07/25/2017, 9:22 PM

## 2017-07-25 NOTE — Progress Notes (Signed)
OT Cancellation Note  Patient Details Name: [name] MRN: [id number] DOB: 10/30/1940   Cancelled Treatment:    Reason Eval/Treat Not Completed: Medical issues which prohibited therapy. Remains on vent and sedated.   Norman Herrlich, MS OTR/L  Pager: [place] [facility] Erline [place] 07/25/2017, 7:25 AM

## 2017-07-26 ENCOUNTER — Inpatient Hospital Stay (HOSPITAL_COMMUNITY): Payer: Medicare Other

## 2017-07-26 DIAGNOSIS — Z930 Tracheostomy status: Secondary | ICD-10-CM

## 2017-07-26 LAB — BLOOD GAS, ARTERIAL
Acid-Base Excess: 8.1 mmol/L — ABNORMAL HIGH (ref 0.0–2.0)
Bicarbonate: 32.2 mmol/L — ABNORMAL HIGH (ref 20.0–28.0)
Drawn by: 34560
FIO2: 50
MECHVT: 400 mL
O2 Saturation: 94.8 %
PEEP: 5 cmH2O
Patient temperature: 99.3
RATE: 12 resp/min
pCO2 arterial: 46.9 mmHg (ref 32.0–48.0)
pH, Arterial: 7.453 — ABNORMAL HIGH (ref 7.350–7.450)
pO2, Arterial: 75.9 mmHg — ABNORMAL LOW (ref 83.0–108.0)

## 2017-07-26 LAB — BASIC METABOLIC PANEL
Anion gap: 14 (ref 5–15)
Anion gap: 11 (ref 5–15)
BUN: 24 mg/dL — ABNORMAL HIGH (ref 6–20)
BUN: 27 mg/dL — AB (ref 6–20)
CHLORIDE: 96 mmol/L — AB (ref 101–111)
CO2: 26 mmol/L (ref 22–32)
CO2: 28 mmol/L (ref 22–32)
CREATININE: 0.66 mg/dL (ref 0.44–1.00)
Calcium: 6.7 mg/dL — ABNORMAL LOW (ref 8.9–10.3)
Calcium: 6.6 mg/dL — ABNORMAL LOW (ref 8.9–10.3)
Chloride: 97 mmol/L — ABNORMAL LOW (ref 101–111)
Creatinine, Ser: 0.63 mg/dL (ref 0.44–1.00)
GFR calc Af Amer: >60 mL/min (ref >60)
GFR calc Af Amer: >60 mL/min (ref >60)
GFR calc non Af Amer: >60 mL/min (ref >60)
GFR calc non Af Amer: >60 mL/min (ref >60)
Glucose, Bld: 153 mg/dL — ABNORMAL HIGH (ref 65–99)
Glucose, Bld: 146 mg/dL — ABNORMAL HIGH (ref 65–99)
POTASSIUM: 3.8 mmol/L (ref 3.5–5.1)
Potassium: 3.1 mmol/L — ABNORMAL LOW (ref 3.5–5.1)
SODIUM: 135 mmol/L (ref 135–145)
Sodium: 137 mmol/L (ref 135–145)

## 2017-07-26 LAB — CBC
HCT: 28.6 % — ABNORMAL LOW (ref 36.0–46.0)
Hemoglobin: 9.1 g/dL — ABNORMAL LOW (ref 12.0–15.0)
MCH: 30.3 pg (ref 26.0–34.0)
MCHC: 31.8 g/dL (ref 30.0–36.0)
MCV: 95.3 fL (ref 78.0–100.0)
Platelets: 384 10*3/uL (ref 150–400)
RBC: 3 MIL/uL — ABNORMAL LOW (ref 3.87–5.11)
RDW: 17.6 % — ABNORMAL HIGH (ref 11.5–15.5)
WBC: 9.3 10*3/uL (ref 4.0–10.5)

## 2017-07-26 LAB — COMPREHENSIVE METABOLIC PANEL
ALT: 33 U/L (ref 14–54)
AST: 50 U/L — ABNORMAL HIGH (ref 15–41)
Albumin: 2.8 g/dL — ABNORMAL LOW (ref 3.5–5.0)
Alkaline Phosphatase: 165 U/L — ABNORMAL HIGH (ref 38–126)
Anion gap: 12 (ref 5–15)
BUN: 22 mg/dL — ABNORMAL HIGH (ref 6–20)
CO2: 27 mmol/L (ref 22–32)
Calcium: 6.7 mg/dL — ABNORMAL LOW (ref 8.9–10.3)
Chloride: 96 mmol/L — ABNORMAL LOW (ref 101–111)
Creatinine, Ser: 0.62 mg/dL (ref 0.44–1.00)
GFR calc Af Amer: >60 mL/min (ref >60)
GFR calc non Af Amer: >60 mL/min (ref >60)
Glucose, Bld: 115 mg/dL — ABNORMAL HIGH (ref 65–99)
Potassium: 2.6 mmol/L — CL (ref 3.5–5.1)
Sodium: 135 mmol/L (ref 135–145)
Total Bilirubin: 1.3 mg/dL — ABNORMAL HIGH (ref 0.3–1.2)
Total Protein: 5.1 g/dL — ABNORMAL LOW (ref 6.5–8.1)

## 2017-07-26 LAB — GLUCOSE, CAPILLARY
Glucose-Capillary: 100 mg/dL — ABNORMAL HIGH (ref 65–99)
Glucose-Capillary: 84 mg/dL (ref 65–99)
Glucose-Capillary: 78 mg/dL (ref 65–99)
Glucose-Capillary: 162 mg/dL — ABNORMAL HIGH (ref 65–99)

## 2017-07-26 LAB — POCT I-STAT, CHEM 8
BUN: 24 mg/dL — ABNORMAL HIGH (ref 6–20)
Calcium, Ion: 0.89 mmol/L — CL (ref 1.15–1.40)
Chloride: 94 mmol/L — ABNORMAL LOW (ref 101–111)
Creatinine, Ser: 0.6 mg/dL (ref 0.44–1.00)
Glucose, Bld: 163 mg/dL — ABNORMAL HIGH (ref 65–99)
HCT: 29 % — ABNORMAL LOW (ref 36.0–46.0)
Hemoglobin: 9.9 g/dL — ABNORMAL LOW (ref 12.0–15.0)
Potassium: 3.2 mmol/L — ABNORMAL LOW (ref 3.5–5.1)
Sodium: 138 mmol/L (ref 135–145)
TCO2: 30 mmol/L (ref 22–32)

## 2017-07-26 LAB — MAGNESIUM: Magnesium: 1.9 mg/dL (ref 1.7–2.4)

## 2017-07-26 LAB — COOXEMETRY PANEL
Carboxyhemoglobin: 1.4 % (ref 0.5–1.5)
Methemoglobin: 1.2 % (ref 0.0–1.5)
O2 Saturation: 57.9 %
Total hemoglobin: 9.6 g/dL — ABNORMAL LOW (ref 12.0–16.0)

## 2017-07-26 LAB — PHOSPHORUS: PHOSPHORUS: 3.1 mg/dL (ref 2.5–4.6)

## 2017-07-26 LAB — PROCALCITONIN: PROCALCITONIN: 0.52 ng/mL

## 2017-07-26 MED ORDER — POTASSIUM CHLORIDE 10 MEQ/50ML IV SOLN
10.0000 meq | INTRAVENOUS | Status: AC
  Administered 2017-07-26 (×3): 10 meq via INTRAVENOUS
  Filled 2017-07-26 (×3): qty 50

## 2017-07-26 MED ORDER — QUETIAPINE FUMARATE 100 MG PO TABS
100.0000 mg | ORAL_TABLET | Freq: Every day | ORAL | Status: DC
  Administered 2017-07-26: 100 mg via ORAL
  Filled 2017-07-26: qty 1

## 2017-07-26 MED ORDER — MAGNESIUM SULFATE 2 GM/50ML IV SOLN
2.0000 g | Freq: Once | INTRAVENOUS | Status: AC
  Administered 2017-07-26: 2 g via INTRAVENOUS
  Filled 2017-07-26: qty 50

## 2017-07-26 MED ORDER — CHLORHEXIDINE GLUCONATE 0.12% ORAL RINSE (MEDLINE KIT)
15.0000 mL | Freq: Two times a day (BID) | OROMUCOSAL | Status: DC
  Administered 2017-07-26 – 2017-08-11 (×32): 15 mL via OROMUCOSAL

## 2017-07-26 MED ORDER — PRO-STAT SUGAR FREE PO LIQD
30.0000 mL | Freq: Three times a day (TID) | ORAL | Status: DC
  Administered 2017-07-26 – 2017-08-12 (×50): 30 mL
  Filled 2017-07-26 (×43): qty 30

## 2017-07-26 MED ORDER — SPIRONOLACTONE 25 MG PO TABS
25.0000 mg | ORAL_TABLET | Freq: Every day | ORAL | Status: DC
  Administered 2017-07-26 – 2017-08-12 (×17): 25 mg via ORAL
  Filled 2017-07-26 (×18): qty 1

## 2017-07-26 MED ORDER — VITAL 1.5 CAL PO LIQD: 1000.0000 mL | ORAL | Status: DC

## 2017-07-26 MED ORDER — CLONAZEPAM 0.5 MG PO TBDP
1.0000 mg | ORAL_TABLET | Freq: Two times a day (BID) | ORAL | Status: DC
  Administered 2017-07-26: 1 mg via ORAL
  Filled 2017-07-26: qty 2

## 2017-07-26 MED ORDER — ORAL CARE MOUTH RINSE
15.0000 mL | Freq: Four times a day (QID) | OROMUCOSAL | Status: DC
  Administered 2017-07-27 – 2017-08-11 (×55): 15 mL via OROMUCOSAL

## 2017-07-26 MED ORDER — VITAL 1.5 CAL PO LIQD
1000.0000 mL | ORAL | Status: DC
  Administered 2017-07-27 – 2017-08-09 (×10): 1000 mL
  Filled 2017-07-26 (×12): qty 1000

## 2017-07-26 MED ORDER — METOCLOPRAMIDE HCL 5 MG/ML IJ SOLN
10.0000 mg | Freq: Four times a day (QID) | INTRAMUSCULAR | Status: AC
  Administered 2017-07-26 – 2017-07-27 (×4): 10 mg via INTRAVENOUS
  Filled 2017-07-26 (×4): qty 2

## 2017-07-26 MED ORDER — POTASSIUM CHLORIDE 10 MEQ/50ML IV SOLN
10.0000 meq | INTRAVENOUS | Status: AC
  Administered 2017-07-26 (×3): 10 meq via INTRAVENOUS
  Filled 2017-07-26: qty 50

## 2017-07-26 MED ORDER — CLONAZEPAM 0.5 MG PO TBDP: 0.5000 mg | ORAL_TABLET | Freq: Two times a day (BID) | ORAL | Status: DC

## 2017-07-26 NOTE — Progress Notes (Signed)
Physical Therapy Treatment Patient Details Name: [name] MRN: [id number] DOB: 12/08/1940 Today's Date: 07/26/2017    History of Present Illness Pt admitted 07/10/17 for AVR and ascending AA replacement. Post procedure had VF arrest. 07/11/17 back to OR to evacuate hematoma and place RVAD due to fluid overload and R systolic HF. RVAD subsequently removed. Pt intubated from 07/10/17-07/19/17. Re-intubated 1/24-1/29; trach placed 1/29. PMH: PVD, afib, vfib, hypothyroidism, anxiety and depression, HTN, HLD, R TKA.   PT Comments    Pt seen with OT to assess current status. Appropriately interactive and motivated to participate, although restless throughout session and clearly wanting trach removed. Able to sit EOB with maxA+2, progressing to modA for seated balance. SpO2 down to 84%, returning to 91% in supine (RN notified). Continue to recommend CIR-level therapies to maximize functional mobility and independence prior to return home. Will follow acutely.   Follow Up Recommendations  CIR(pending medical appropriateness)     Equipment Recommendations  (TBD next venue)    Recommendations for Other Services       Precautions / Restrictions Precautions Precautions: Fall;Sternal Precaution Comments: reinforced sternal precautions during bed mobility Restrictions Weight Bearing Restrictions: Yes Other Position/Activity Restrictions: Sternal    Mobility  Bed Mobility Overal bed mobility: Needs Assistance Bed Mobility: Rolling;Sidelying to Sit;Sit to Sidelying Rolling: Max assist;+2 for physical assistance Sidelying to sit: Max assist;+2 for physical assistance;+2 for safety/equipment;HOB elevated     Sit to sidelying: Total assist;+2 for physical assistance General bed mobility comments: instructed in log roll for sternal precautions, and also to minimize abdominal pain  Transfers                 General transfer comment: Not attempted this session, Pt requesting to lie back  down with SpO2 down to 84% on vent/trach in sitting  Ambulation/Gait                 Stairs            Wheelchair Mobility    Modified Rankin (Stroke Patients Only)       Balance Overall balance assessment: Needs assistance Sitting-balance support: Feet supported Sitting balance-Leahy Scale: Poor Sitting balance - Comments: Pt able to progress to mod +1 for sitting EOB Postural control: Posterior lean   Standing balance-Leahy Scale: Zero                              Cognition Arousal/Alertness: Awake/alert Behavior During Therapy: Restless Overall Cognitive Status: Difficult to assess Area of Impairment: Attention;Following commands;Safety/judgement                   Current Attention Level: Sustained   Following Commands: Follows one step commands with increased time Safety/Judgement: Decreased awareness of safety     General Comments: Pt closing eyes throughout session, requiring cues to engage again in session. Overall, interacting appropriately; very clearly aggravated by amount of lines and trach/vent      Exercises      General Comments General comments (skin integrity, edema, etc.): SpO2 down to 84% while sitting on 10L O2; returned to >90% in supine with HOB elevated. HR remained in 80s throughout session      Pertinent Vitals/Pain Pain Assessment: Faces Faces Pain Scale: Hurts even more Pain Location: abdomen and neck Pain Descriptors / Indicators: Grimacing;Guarding Pain Intervention(s): Monitored during session;Limited activity within patient's tolerance;Repositioned    Home Living  Prior Function            PT Goals (current goals can now be found in the care plan section) Acute Rehab PT Goals Patient Stated Goal: get mitts off and trach out PT Goal Formulation: With patient Time For Goal Achievement: 08/09/17 Potential to Achieve Goals: Good Progress towards PT goals: Progressing  toward goals    Frequency    Min 3X/week      PT Plan      Co-evaluation PT/OT/SLP Co-Evaluation/Treatment: Yes Reason for Co-Treatment: Complexity of the patient's impairments (multi-system involvement);For patient/therapist safety;To address functional/ADL transfers PT goals addressed during session: Mobility/safety with mobility;Balance OT goals addressed during session: ADL's and self-care;Strengthening/ROM      AM-PAC PT "6 Clicks" Daily Activity  Outcome Measure  Difficulty turning over in bed (including adjusting bedclothes, sheets and blankets)?: Unable Difficulty moving from lying on back to sitting on the side of the bed? : Unable Difficulty sitting down on and standing up from a chair with arms (e.g., wheelchair, bedside commode, etc,.)?: Unable Help needed moving to and from a bed to chair (including a wheelchair)?: A Lot Help needed walking in hospital room?: Total Help needed climbing 3-5 steps with a railing? : Total 6 Click Score: 7    End of Session Equipment Utilized During Treatment: Oxygen Activity Tolerance: Patient tolerated treatment well Patient left: in bed;with call bell/phone within reach;with bed alarm set;with family/visitor present Nurse Communication: Mobility status PT Visit Diagnosis: Other abnormalities of gait and mobility (R26.89);Muscle weakness (generalized) (M62.81)     Time: 8768-1157 PT Time Calculation (min) (ACUTE ONLY): 29 min  Charges:  $Therapeutic Activity: 8-22 mins                    G Codes:      Mabeline Caras, PT, DPT Acute Rehab Services  Pager: [place] 07/26/2017, 11:51 AM

## 2017-07-26 NOTE — Progress Notes (Signed)
Nutrition Follow-up  INTERVENTION:   Decrease Vital 1.5 to 40 ml/hr (960 ml/day) 30 ml Prostat TID  Provides: 1740 kcal, 109 grams protein, and 733 ml free water.    NUTRITION DIAGNOSIS:   Inadequate oral intake related to inability to eat as evidenced by NPO status. Ongoing.   GOAL:   Patient will meet greater than or equal to 90% of their needs Met  MONITOR:   Vent status, I & O's, TF tolerance  ASSESSMENT:   Pt with PMH of HTN, HLD, B12 deficiency admitted 1/14 for planned AVR and replacement of ascending aortic aneurysm. Pt developed acute cardiogenic shock and VF arrest. OR 1/15 for evac of hematoma and placement of RVAD/centromag.   1/14 S/p AVR and ascending AA replacement  1/14 VF Arrest in evening with shock x 1 1/15 to OR for evacuation of hematoma and RVAD placement  1/21 RVAD removed, sternum closed 1/29 trach 1/30 Cortrak repositioned, tip post pyloric, on trach collar  TF: Vital 1.2 @ 45 (provides 1620 kcal and 72 grams protein)  Pt negative 3.5 L  Medications reviewed and include: dulcolax, colace, lasix, novolog, levemir, levo Labs reviewed: K+ 3.2 (L)  Diet Order:  Diet NPO time specified  EDUCATION NEEDS:   No education needs have been identified at this time  Skin:  Skin Assessment: Reviewed RN Assessment(MASD abdomen)  Last BM:  1/28  Height:   Ht Readings from Last 1 Encounters:  07/26/17 _0  (1.651 m)    Weight:   Wt Readings from Last 1 Encounters:  07/26/17 198 lb 13.7 oz (90.2 kg)    Ideal Body Weight:  56.8 kg  BMI:  Body mass index is 33.09 kg/m.  Estimated Nutritional Needs:   Kcal:  1600-1800  Protein:  110-120 grams  Fluid:  > 1.5 L/day  Maylon Peppers RD, LDN, CNSC [phone] Pager [phone] After Hours Pager

## 2017-07-26 NOTE — Progress Notes (Signed)
Patient ID: [name], female   DOB: 1941-06-06, 77 y.o.   MRN: [id number]     Advanced Heart Failure Rounding Note  Primary Cardiologist: Dr. Percival Spanish (2014) HF: (New) Dr. Haroldine Laws   Subjective:    Events - 07/10/17 S/p AVR and ascending AA replacement  - 07/10/17 VF Arrest in evening with shock x 1 - 07/11/17 RVAD placement  - 07/17/2017 RVAD removed with chest closed -1/23 Extubated. Went in A fib. Amio started.  -1/24 Reintubated.  -1/29 Trach  Underwent trach 1/29. Yesterday milrinone was cut back to 0.125 mcg. CO-OX dropped to the 40s. Milrinone was increased to 0.25 mcg. CO-OX increased to 58%. NE weaned to 8 mcg. Tolerating trach collar.   Agitated with placement of feeding tube.  Communicates. Weight recorded as up 7 pounds    Echo 06/02/2017 LVEF 55-60%, Grade 1 DD, Mild/Mod AI with LVOT, Mid ascending aortic diameter 48.88 mm, Mild LAE, RV normal  Echo (07/22/17): EF 65-70%, bioprosthetic aortic valve functioning normally, PASP 33 mmHg, RV looks ok.   Objective:   Weight Range: 198 lb 13.7 oz (90.2 kg) Body mass index is 33.09 kg/m.   Vital Signs:   Temp:  [98.8 F (37.1 C)-99.9 F (37.7 C)] 98.8 F (37.1 C) (01/30 0800) Pulse Rate:  [66-89] 80 (01/30 1110) Resp:  [8-40] 22 (01/30 1110) BP: (83-136)/(52-99) 96/72 (01/30 1110) SpO2:  [90 %-100 %] 93 % (01/30 1110) FiO2 (%):  [40 %-60 %] 60 % (01/30 1110) Weight:  [198 lb 13.7 oz (90.2 kg)] 198 lb 13.7 oz (90.2 kg) (01/30 0200) Last BM Date: 07/24/17  Weight change: Filed Weights   07/24/17 0500 07/25/17 0200 07/26/17 0200  Weight: 191 lb 9.3 oz (86.9 kg) 189 lb 13.1 oz (86.1 kg) 198 lb 13.7 oz (90.2 kg)    Intake/Output:   Intake/Output Summary (Last 24 hours) at 07/26/2017 1140 Last data filed at 07/26/2017 1000 Gross per 24 hour  Intake 2273.07 ml  Output 2950 ml  Net -676.93 ml    Physical Exam  CVP 18 General:  Trached. No respiratory  difficulty HEENT: Trach  Neck: supple. JVP hard to  assess.  Carotids 2+ bilat; no bruits. No lymphadenopathy or thryomegaly appreciated. Cor: PMI nondisplaced. Regular rate & rhythm. No rubs, gallops or murmurs. Lungs: coarse throughout.  Abdomen: soft, nontender, nondistended. No hepatosplenomegaly. No bruits or masses. Good bowel sounds. Extremities: no cyanosis, clubbing, rash, R and LLE SCDs. 1-2+ edema Neuro: Agitated. Trached    Telemetry  NSR 80s personally reviewed.   EKG    No new tracings.    Labs    CBC Recent Labs    07/25/17 0350 07/25/17 1737 07/26/17 0341  WBC 9.8  --  9.3  HGB 8.1* 10.2* 9.1*  HCT 25.8* 30.0* 28.6*  MCV 97.4  --  95.3  PLT 366  --  834   Basic Metabolic Panel Recent Labs    07/25/17 0350  07/25/17 2127 07/26/17 0341  NA 138   < > 135 135  K 3.1*   < > 3.7 2.6*  CL 98*  --  96* 96*  CO2 28  --  28 27  GLUCOSE 162*   < > 167* 115*  BUN 30*  --  24* 22*  CREATININE 0.71  --  0.61 0.62  CALCIUM 6.5*  --  6.3* 6.7*  MG 1.8  --   --  1.9  PHOS 3.2  --   --  3.1   < > =  values in this interval not displayed.   Liver Function Tests Recent Labs    07/25/17 0350 07/26/17 0341  AST 48* 50*  ALT 33 33  ALKPHOS 194* 165*  BILITOT 1.5* 1.3*  PROT 5.6* 5.1*  ALBUMIN 3.0* 2.8*   No results for input(s): LIPASE, AMYLASE in the last 72 hours. Cardiac Enzymes Recent Labs    07/25/17 0350  CKTOTAL 173  CKMB 5.4*    BNP: BNP (last 3 results) No results for input(s): BNP in the last 8760 hours.  ProBNP (last 3 results) No results for input(s): PROBNP in the last 8760 hours.   D-Dimer No results for input(s): DDIMER in the last 72 hours. Hemoglobin A1C No results for input(s): HGBA1C in the last 72 hours. Fasting Lipid Panel Recent Labs    07/23/17 1358  TRIG 530*   Thyroid Function Tests No results for input(s): TSH, T4TOTAL, T3FREE, THYROIDAB in the last 72 hours.  Invalid input(s): FREET3  Other results:   Imaging    Dg Chest Port 1 View  Result Date:  07/26/2017 CLINICAL DATA:  Right ventricular assistance device. EXAM: PORTABLE CHEST 1 VIEW COMPARISON:  Radiograph of July 25, 2017. FINDINGS: Tracheostomy tube is in grossly good position. Feeding tube is unchanged in position. Right-sided PICC line and left subclavian catheters are unchanged in position. Status post aortic valve replacement. No pneumothorax is noted. Mild bibasilar subsegmental atelectasis is noted. Minimal left pleural effusion is noted. Bony thorax is unremarkable. IMPRESSION: Stable support apparatus. Stable bibasilar subsegmental atelectasis with minimal left pleural effusion. Electronically Signed   By: Marijo Conception, M.D.   On: 07/26/2017 08:56    Medications:    Scheduled Medications: . acetaminophen (TYLENOL) oral liquid 160 mg/5 mL  650 mg Per Tube Once   Or  . acetaminophen  650 mg Rectal Once  . arformoterol  15 mcg Nebulization BID  . aspirin EC  325 mg Oral Daily   Or  . aspirin  324 mg Per Tube Daily  . bisacodyl  10 mg Oral Daily   Or  . bisacodyl  10 mg Rectal Daily  . budesonide (PULMICORT) nebulizer solution  0.5 mg Nebulization BID  . chlorhexidine gluconate (MEDLINE KIT)  15 mL Mouth Rinse BID  . Chlorhexidine Gluconate Cloth  6 each Topical Daily  . clonazepam  0.5 mg Oral BID  . docusate  200 mg Per Tube Daily  . enoxaparin (LOVENOX) injection  40 mg Subcutaneous Q24H  . feeding supplement (PRO-STAT SUGAR FREE 64)  30 mL Per Tube BID  . feeding supplement (VITAL AF 1.2 CAL)  1,000 mL Per Tube Q24H  . furosemide  80 mg Intravenous BID  . Gerhardt's butt cream   Topical BID  . insulin aspart  0-24 Units Subcutaneous Q4H  . insulin detemir  10 Units Subcutaneous BID  . latanoprost  1 drop Both Eyes QHS  . levalbuterol  1.25 mg Nebulization Q6H  . mouth rinse  15 mL Mouth Rinse 10 times per day  . metoCLOPramide (REGLAN) injection  10 mg Intravenous Q6H  . naphazoline-glycerin  1-2 drop Both Eyes BID  . pantoprazole (PROTONIX) IV  40 mg  Intravenous Q12H  . QUEtiapine  100 mg Per Tube QHS  . sodium chloride flush  10-40 mL Intracatheter Q12H  . sodium chloride flush  10-40 mL Intracatheter Q12H  . sodium chloride flush  3 mL Intravenous Q12H  . venlafaxine XR  75 mg Oral Daily    Infusions: . sodium  chloride 250 mL (07/22/17 1820)  . amiodarone 30 mg/hr (07/26/17 0752)  . cefTAZidime (FORTAZ)  IV Stopped (07/26/17 0929)  . dexmedetomidine (PRECEDEX) IV infusion 1.7 mcg/kg/hr (07/26/17 0254)  . feeding supplement (VITAL 1.5 CAL)    . fentaNYL infusion INTRAVENOUS 400 mcg/hr (07/26/17 0700)  . lactated ringers Stopped (07/17/17 1200)  . magnesium sulfate 1 - 4 g bolus IVPB 2 g (07/26/17 1102)  . milrinone 0.25 mcg/kg/min (07/26/17 0754)  . norepinephrine (LEVOPHED) Adult infusion 8 mcg/min (07/26/17 0700)    PRN Medications: metoprolol tartrate, midazolam, ondansetron (ZOFRAN) IV, sodium chloride flush    Patient Profile   [name] is a 77 y.o. female with history of fusiform ascending aortic aneurysm, HTN, and HLD.   Admitted 07/10/17 for planned AVR and replacement of ascending aortic aneurysm. Developed acute cardiogenic shock + VF arrest. Taken back to OR am of 07/11/17 for evacuation of hematoma and placement of RVAD.   Assessment/Plan   1. Cardiogenic shock s/p cardiac surgery with R>L CHF:  Pre-op Echo 06/02/2017 LVEF 55-60%, Grade 1 DD, Mild/Mod AI with LVOT, Mid ascending aortic diameter 48.88 mm, Mild LAE, RV normal.  Post-op shock, RVAD placed now s/p RVAD removal 07/17/2017.  Echo 1/26 with EF 65-70%, bioprosthetic aortic valve, improved RV function.  - Remains on milrinone 0.25 mcg and NE at 8 mcg.  -Todays CO-OX is 58%. CVP 17-18  - Continue 80 mg IV lasix twice a day.  - Supp K.  - RV function looked better on echo 07/22/16.  2. VF arrest:  - Stable. No further. Remains on amiodarone gtt.  -  Goal K > 4.0 and Mg > 2.0 - Mag 1.8. Supp Mg with 2 g. K low . Getting K runs.    3. Atrial  fibrillation:  -In NSR today.   - Continue amio gtt at 30 mg per hour while on milrinone. - No anticoagulation for now. Add per Dr Darcey Nora. - Currently only getting lovenox --->DVT prophylaxis. 4. Acute respiratory failure: Reintubated 1/24.   - S/P Trach 1/29. On 50% trach collar. Tolerating wean.   Continues on ceftazidime per CCM.  5. S/p AVR and Ascending aortic aneurysm replacement. - Post-op echo stable.  6. AKI:  - Resolved.   7. Anemia:  - Hgb 9.1  Transfuse < 8.0.  - s/p 1uPRBCs 07/23/17. 8. Hypokalemia - K 2.6. Receiving 6 runs.     Length of Stay: [place], NP  11:40 AM Advanced Heart Failure Team Pager [phone] (M-F; 7a - 4p)  Please contact [facility] for night-coverage after hours (4p -7a ) and weekends on [email or web address]  Patient seen and examined with Darrick Grinder, NP. We discussed all aspects of the encounter. I agree with the assessment and plan as stated above.   Now s/p trach. Awake on vent but agitated. Co-ox better after milrinone turned up. Still remains volume overloaded CVP 17-18. K is low. Will continue IV diuresis as tolerated. Supp K. Add spiro to help with diuresis and preserve K. Wean pressors. Maintaining NSR on amio.  Glori Bickers, MD  2:18 PM

## 2017-07-26 NOTE — Progress Notes (Signed)
CT surgery p.m. Rounds  Patient tolerating trach collar weaning but now very anxious and agitated and upset. Patient receiving IV Precedex and fentanyl She remains markedly alkalotic and hypokalemic-receiving several IV supplements of potassium Will increase twice a day dosing of Klonopin Seroquel. Because of QTC greater530 Postop anxiety-delirium has significantly impeded d her recovery Maintaining sinus rhythm on IV amiodarone

## 2017-07-26 NOTE — Progress Notes (Signed)
Occupational Therapy Treatment Patient Details Name: [name] MRN: [id number] DOB: 12-Dec-1940 Today's Date: 07/26/2017    History of present illness Pt admitted 07/10/17 for AVR and ascending AA replacement. Post procedure had VF arrest. 07/11/17 back to OR to evacuate hematoma and place RVAD due to fluid overload and R systolic HF. RVAD subsequently removed. Pt intubated from 07/10/17-07/19/17. Re-intubated 1/24-/129, trach in place currently PMH: PVD, afib, vfib, hypothyroidism, anxiety and depression, HTN, HLD, R TKA.   OT comments  Pt seen with PT today to assess status, and Pt very communicative (as much as possible with trach), willing and excited to work with therapy. Pt was able to assist more to come and sit EOB for approx 5 min before O2 saturations dropped to 84 (which returned to 91 in supine). Pt continues to be an excellent candidate for CIR level therapy once medically stable to return to PLOF, and OT should continue to follow in the acute setting.   Follow Up Recommendations  CIR    Equipment Recommendations  3 in 1 bedside commode    Recommendations for Other Services Rehab consult    Precautions / Restrictions Precautions Precautions: Fall;Sternal Precaution Comments: reinforced sternal precautions during bed mobility Restrictions Weight Bearing Restrictions: Yes Other Position/Activity Restrictions: Sternal       Mobility Bed Mobility Overal bed mobility: Needs Assistance Bed Mobility: Rolling;Sidelying to Sit;Sit to Sidelying Rolling: Max assist;+2 for physical assistance Sidelying to sit: Max assist;+2 for physical assistance;+2 for safety/equipment;HOB elevated(30 degrees)     Sit to sidelying: Total assist;+2 for physical assistance General bed mobility comments: instructed in log roll for sternal precautions, and also to minimize abdominal pain  Transfers                 General transfer comment: Not attempted this session, Pt requesting to  lie back down    Balance Overall balance assessment: Needs assistance Sitting-balance support: Feet supported Sitting balance-Leahy Scale: Poor Sitting balance - Comments: Pt able to progress to mod +1 for sitting EOB                                   ADL either performed or assessed with clinical judgement   ADL Overall ADL's : Needs assistance/impaired                                       General ADL Comments: Pt currently requiring total A for ADL     Vision   Additional Comments: Not formally assessed, Pt able to track appropriately   Perception     Praxis      Cognition Arousal/Alertness: Awake/alert Behavior During Therapy: Restless Overall Cognitive Status: Difficult to assess Area of Impairment: Attention;Following commands;Safety/judgement                   Current Attention Level: Sustained   Following Commands: Follows one step commands with increased time Safety/Judgement: Decreased awareness of safety              Exercises     Shoulder Instructions       General Comments Pt on 10L O2, and while sitting desaturated to 84 %. Returned to 91% in supine on 10L with HOB elevated. Other VSS throughout session. HR remained in the 80's throughout    Pertinent Vitals/ Pain  Pain Assessment: Faces Faces Pain Scale: Hurts even more Pain Location: abdomen and neck Pain Intervention(s): Monitored during session;Limited activity within patient's tolerance;Repositioned  Home Living                                          Prior Functioning/Environment              Frequency  Min 2X/week        Progress Toward Goals  OT Goals(current goals can now be found in the care plan section)  Progress towards OT goals: Progressing toward goals  Acute Rehab OT Goals Patient Stated Goal: get mitts off and trach out OT Goal Formulation: With patient Time For Goal Achievement:  08/02/17 Potential to Achieve Goals: Good  Plan Discharge plan remains appropriate;Frequency remains appropriate    Co-evaluation    PT/OT/SLP Co-Evaluation/Treatment: Yes Reason for Co-Treatment: Complexity of the patient's impairments (multi-system involvement);For patient/therapist safety;To address functional/ADL transfers   OT goals addressed during session: ADL's and self-care;Strengthening/ROM      AM-PAC PT "6 Clicks" Daily Activity     Outcome Measure   Help from another person eating meals?: Total Help from another person taking care of personal grooming?: Total Help from another person toileting, which includes using toliet, bedpan, or urinal?: Total Help from another person bathing (including washing, rinsing, drying)?: Total Help from another person to put on and taking off regular upper body clothing?: Total Help from another person to put on and taking off regular lower body clothing?: Total 6 Click Score: 6    End of Session Equipment Utilized During Treatment: Oxygen  OT Visit Diagnosis: Unsteadiness on feet (R26.81);Pain;Muscle weakness (generalized) (M62.81)   Activity Tolerance Patient tolerated treatment well(Pt very agreeable and excited for therapy)   Patient Left in bed;with call bell/phone within reach   Nurse Communication Mobility status; restraints back in place        Time: 8309-4076 OT Time Calculation (min): 30 min  Charges: OT General Charges $OT Visit: 1 Visit OT Treatments $Therapeutic Activity: 8-22 mins  Hulda Humphrey OTR/L [place] 07/26/2017, 11:23 AM

## 2017-07-26 NOTE — Progress Notes (Signed)
CRITICAL VALUE ALERT  Critical Value:  K 2.6   Date & Time Notied:  07/26/17 @ 1504  Provider Notified: N/A, implementing standing TCTS potassium replacement protocol.  Orders Received/Actions taken: Implementing TCTS potassium replacement protocol.

## 2017-07-26 NOTE — Progress Notes (Signed)
Cortrak Tube Team Note:  Consult received to reposition a Cortrak feeding tube.  Per discussion with RN, Cortrak tube became dislodged when OG tube was removed for trach placement.  A 10 F Cortrak tube was repositioned and remains in the L nare and secured with a nasal bridle at 85 cm. Per the Cortrak monitor reading the tube tip is post pyloric.   X-ray is required, abdominal x-ray has been ordered by the Cortrak team. Please confirm tube placement before using the Cortrak tube.   If the tube becomes dislodged please keep the tube and contact the Cortrak team at [email or web address] ([email or web address]) for replacement.  If after hours and replacement cannot be delayed, place a NG tube and confirm placement with an abdominal x-ray.    Gaynell Face, MS, RD, LDN Pager: [phone] Weekend/After Hours: [phone]

## 2017-07-26 NOTE — Progress Notes (Signed)
Pt placed on ATC 10L/60% per MD request. Pt very anxious today so RT will closely monitor for need to go back on vent.

## 2017-07-26 NOTE — Progress Notes (Signed)
CRITICAL VALUE ALERT  Critical Value:  Ionized Calcium 0.89  Date & Time Notied: 07/26/17 1500  Provider Notified: Barrett PA 07/26/17 1507  Orders Received/Actions taken: No new orders added. Will continue to monitor patient closely.  Sydnee Cabal. Lovena Le RN

## 2017-07-26 NOTE — Progress Notes (Addendum)
..  Name: [name] MRN: [id number] DOB: 1940/09/20    ADMISSION DATE:  07/10/2017  CONSULTATION DATE:  07/12/17  REFERRING MD :  Nils Pyle MD   BRIEF:   77 year old female with past medical history significant for fusiform ascending aortic aneurysm, hypertension, hyperlipidemia, status post AVR and replacement of ascending aortic aneurysm.  On 07/10/2017 postprocedure patient developed acute cardiogenic shock had an episode of V. fib arrest was taken back to the OR on 07/11/2017 for evacuation of hematoma and placement of RVAD.  PCCM consulted for ventilator management.   STUDIES: Echo12/12/2016 LVEF 55-60%, Grade 1 DD, Mild/Mod AI with LVOT, Mid ascending aortic diameter 48.88 mm, Mild LAE, RV normal Echo (07/22/17): EF 65-70%, bioprosthetic aortic valve functioning normally, PASP 33 mmHg, RV looks ok.   Culture data 1/24 sputum: oral flora  abx Ceftaz1/17>>>1.20 Cefuroxime 1/21>>1/23 ceftaz 1/23>>>  Events - 07/10/17 Admit - 07/10/17 S/p AVR and ascending AA replacement  - 07/10/17 VF Arrest in evening with shock x 1 - 07/11/17 to OR for evacuation of hematoma and RVAD placement  - 07/12/17 PCCM consult 07/15/2017 Lasix per cardiology CVP of 5 1/21 RVAD removed 1/22 heavily diuresed  1/23 ready for extubation. Atrial fib amio started.  1/24: reintubated 1/25 - Afebrile 1/29 - trach placed   SUBJECTIVE/OVERNIGHT/INTERVAL HX Agitation overnight requiring freq versed pushes & increasing dose of precedex gtt Good UO with lasix  afebrile  VITAL SIGNS: Temp:  [97.7 F (36.5 C)-99.9 F (37.7 C)] 98.8 F (37.1 C) (01/30 0800) Pulse Rate:  [66-89] 77 (01/30 0945) Resp:  [8-40] 16 (01/30 0945) BP: (83-136)/(51-99) 108/59 (01/30 0945) SpO2:  [90 %-100 %] 96 % (01/30 0945) FiO2 (%):  [40 %-50 %] 50 % (01/30 0742) Weight:  [198 lb 13.7 oz (90.2 kg)] 198 lb 13.7 oz (90.2 kg) (01/30 0200)   Filed Weights   07/24/17 0500 07/25/17 0200 07/26/17 0200  Weight: 191 lb 9.3 oz  (86.9 kg) 189 lb 13.1 oz (86.1 kg) 198 lb 13.7 oz (90.2 kg)    Intake/Output Summary (Last 24 hours) at 07/26/2017 1008 Last data filed at 07/26/2017 0900 Gross per 24 hour  Intake 2631.47 ml  Output 3315 ml  Net -683.53 ml     PHYSICAL EXAMINATION: General:  Elderly female in NAD Neuro:  Sedated on vent HEENT:  /AT, No JVD noted, PERRL Cardiovascular:  RRR, no MRG Lungs:  Coarse bilateral vent supported breaths.  Abdomen:  Soft, non-distended Musculoskeletal:  No acute deformity Skin:  Sternotomy incision closed, no s/s infection.   PULMONARY Recent Labs  Lab 07/22/17 0334  07/22/17 1627 07/23/17 0146  07/23/17 1602 07/24/17 0257  07/24/17 0910 07/25/17 0400 07/25/17 0740 07/26/17 0351 07/26/17 0430  PHART 7.488*  --   --  7.480*  --   --  7.510*  --  7.520*  --   --   --  7.453*  PCO2ART 42.4  --   --  43.0  --   --  40.6  --  38.6  --   --   --  46.9  PO2ART 76.5*  --   --  75.0*  --   --  59.0*  --  61.0*  --   --   --  75.9*  HCO3 31.8*  --   --  32.1*  --   --  32.4*  --  31.5*  --   --   --  32.2*  TCO2  --   --  29 33*  --  32 34*  --  33*  --   --   --   --   O2SAT 96.1   < >  --  96.0   < >  --  93.0   < > 93.0 40.2 47.6 57.9 94.8   < > = values in this interval not displayed.    CBC Recent Labs  Lab 07/24/17 0332  07/25/17 0350 07/25/17 1737 07/26/17 0341  HGB 8.6*   < > 8.1* 10.2* 9.1*  HCT 28.0*   < > 25.8* 30.0* 28.6*  WBC 13.5*  --  9.8  --  9.3  PLT 395  --  366  --  384   < > = values in this interval not displayed.    COAGULATION Recent Labs  Lab 07/24/17 0332 07/25/17 0350  INR 1.14 1.24    CARDIAC  No results for input(s): TROPONINI in the last 168 hours. No results for input(s): PROBNP in the last 168 hours.   CHEMISTRY Recent Labs  Lab 07/20/17 1510 07/21/17 0300 07/21/17 1541  07/23/17 9983 07/23/17 1602 07/23/17 1741 07/24/17 0332 07/24/17 1619 07/25/17 0350 07/25/17 1737 07/25/17 2127 07/26/17 0341  NA 139  137  --    < > 139 139  --  139 139 138 140 135 135  K 3.3* 2.9*  --    < > 3.4* 3.5  --  3.1* 3.4* 3.1* 3.4* 3.7 2.6*  CL 94* 94*  --    < > 97* 94*  --  95*  --  98*  --  96* 96*  CO2 29 28  --    < > 28  --   --  29  --  28  --  28 27  GLUCOSE 85 182*  --    < > 131* 141*  --  106* 98 162* 109* 167* 115*  BUN 21* 28*  --    < > 29* 26*  --  28*  --  30*  --  24* 22*  CREATININE 0.86 0.93  --    < > 0.77 0.70  --  0.74  --  0.71  --  0.61 0.62  CALCIUM 6.5* 6.5*  --    < > 6.7*  --   --  6.9*  --  6.5*  --  6.3* 6.7*  MG 1.6* 2.0 1.8  --   --   --  1.7 2.2  --  1.8  --   --  1.9  PHOS 3.9 3.7 3.4  --   --   --   --   --   --  3.2  --   --  3.1   < > = values in this interval not displayed.   Estimated Creatinine Clearance: 67.4 mL/min (by C-G formula based on SCr of 0.62 mg/dL).   LIVER Recent Labs  Lab 07/22/17 0348 07/23/17 0307 07/24/17 0332 07/25/17 0350 07/26/17 0341  AST 50* 55* 57* 48* 50*  ALT 35 37 39 33 33  ALKPHOS 190* 197* 222* 194* 165*  BILITOT 1.1 1.1 1.2 1.5* 1.3*  PROT 5.3* 5.5* 5.8* 5.6* 5.1*  ALBUMIN 2.7* 2.9* 2.9* 3.0* 2.8*  INR  --   --  1.14 1.24  --     INFECTIOUS Recent Labs  Lab 07/24/17 2300 07/26/17 0341  LATICACIDVEN 0.7  --   PROCALCITON  --  0.52    ENDOCRINE CBG (last 3)  Recent Labs    07/25/17 2316 07/26/17 0346 07/26/17  0750  GLUCAP 162* 100* 84     IMAGING x48h  - image(s) personally visualized  -   highlighted in bold Dg Chest Port 1 View  Result Date: 07/26/2017 CLINICAL DATA:  Right ventricular assistance device. EXAM: PORTABLE CHEST 1 VIEW COMPARISON:  Radiograph of July 25, 2017. FINDINGS: Tracheostomy tube is in grossly good position. Feeding tube is unchanged in position. Right-sided PICC line and left subclavian catheters are unchanged in position. Status post aortic valve replacement. No pneumothorax is noted. Mild bibasilar subsegmental atelectasis is noted. Minimal left pleural effusion is noted. Bony thorax is  unremarkable. IMPRESSION: Stable support apparatus. Stable bibasilar subsegmental atelectasis with minimal left pleural effusion. Electronically Signed   By: Marijo Conception, M.D.   On: 07/26/2017 08:56   Dg Chest Port 1 View  Result Date: 07/25/2017 CLINICAL DATA:  Post trach placement EXAM: PORTABLE CHEST 1 VIEW COMPARISON:  07/25/2017 at 0543 hours FINDINGS: Interval removal of endotracheal tube. Tracheostomy in satisfactory position in the mid trachea. Mild patchy left lower lobe opacity, likely atelectasis, pneumonia not excluded. Small left pleural effusion. No frank interstitial edema.  No pneumothorax. Cardiomegaly.  Prosthetic aortic valve. Left subclavian venous catheter terminating in the proximal SVC. Right chest catheter terminating in the lower SVC. Skin staples and surgical clips overlying the right chest wall/axilla. Status post ORIF of the left humerus. IMPRESSION: Interval removal of endotracheal tube. Tracheostomy in satisfactory position in the mid trachea. Mild patchy left lower lobe opacity, likely atelectasis, pneumonia not excluded. Small left pleural effusion. Otherwise stable. Electronically Signed   By: Julian Hy M.D.   On: 07/25/2017 09:47   Dg Chest Port 1 View  Result Date: 07/25/2017 CLINICAL DATA:  Two weeks postop, possible insertion of tracheostomy today EXAM: PORTABLE CHEST 1 VIEW COMPARISON:  Portable chest x-ray of 07/24/2017 FINDINGS: The tip of the endotracheal tube is only 1.7 cm above the carina. There is slightly improved aeration of the lungs although areas of atelectasis persist. Mild pulmonary vascular congestion cannot be excluded, and there may be a tiny left pleural effusion present. Mild cardiomegaly is stable. Right and left central venous lines are unchanged in position. NG tube extends into the fundus of the stomach. Feeding tube also is present. IMPRESSION: 1. Tip of endotracheal tube only 1.7 cm above the carina. 2. Slightly improved aeration with  persistent basilar atelectasis. Cannot exclude mild pulmonary vascular congestion. Electronically Signed   By: Ivar Drape M.D.   On: 07/25/2017 08:42   Dg Abd Portable 1v  Result Date: 07/25/2017 CLINICAL DATA:  NG tube placement. Removal of right ventricular assist device in sternal closure 6 days ago. EXAM: PORTABLE ABDOMEN - 1 VIEW COMPARISON:  One-view abdomen 07/20/2017. FINDINGS: The tip of a small bore feeding tube terminates in the stomach. Epicardial pacing wires are in place. Bowel gas pattern is unremarkable. IMPRESSION: The tip of a small bore feeding tube is in the stomach. Electronically Signed   By: San Morelle M.D.   On: 07/25/2017 09:56   Personally reviewed both images  ASSESSMENT / PLAN: 77 year old female with PMHx significant for fusiform ascending aortic aneurysm, hypertension, hyperlipidemia, status post AVR and replacement of ascending aortic aneurysm.  On 07/10/2017 postprocedure patient developed acute cardiogenic shock had an episode of V. fib arrest was taken back to the OR on 07/11/2017 for evacuation of hematoma and placement of RVAD.  RVAD now removed, failed extubation 1/24 but continued to wean well. Underwent tracheostomy 1/29.  ASSESSMENT / PLAN:  Acute  respiratory failure initially in setting of cardiogenic shock, currently mechanism seems to be atelectasis, shock, poor cough mechanics, deconditioning +/- HF - Tracheostomy 1/29 -Wean on pressure support with goal of trach collar as tolerated  Cardiogenic shock s/p removal of RVAD 1/21; sternum now closed Atrial fibrillation Medication related hypotension.  Back on levophed since started on propofol VF arrest, brief, immediately post op - no further incidence. Prolonged qT - Pressors, diuresis per Heart Failure service. - Amiodarone - CVTS primary  HCAP/VAP  -Low pro-calcitonin is reassuring and can discontinue ceftazidime   Acute encephalopathy -Continue PAD protocol RASS goal 0 to  -1 Precedex does not seem to work well for her, allowing higher doses up to 1.5  PRN fentanyl, low dose versed  FAMILY  - Updates: no family available.  - Inter-disciplinary family meet or Palliative Care meeting due by: done   Summary-main issue now impeding weaning to trach collar seems to be severe delirium not responding to Precedex, will titrate Klonopin, unfortunately prolonged QT will not tolerate addition of medication such as Seroquel.  Hoping to mobilize and trial trach collar to see if this will help with delirium  The patient is critically ill with multiple organ systems failure and requires high complexity decision making for assessment and support, frequent evaluation and titration of therapies, application of advanced monitoring technologies and extensive interpretation of multiple databases. Critical Care Time devoted to patient care services described in this note independent of APP/resident  time is 32 minutes.   Kara Mead MD. Shade Flood. [facility] Pager [phone] If no response call [phone]    07/26/2017 10:08 AM

## 2017-07-26 NOTE — Progress Notes (Addendum)
[facility] DAILY ICU PROGRESS NOTE                   [address]            [place] [address]          [phone]   9 Days Post-Op Procedure(s) (LRB): REMOVAL OF RVAD WITH PUMP STANDBY (N/A) TRANSESOPHAGEAL ECHOCARDIOGRAM (TEE) (N/A)  Total Length of Stay:  LOS: 16 days   Subjective:  On Trach, denies complaints.   Objective: Vital signs in last 24 hours: Temp:  [97.7 F (36.5 C)-99.9 F (37.7 C)] 99.3 F (37.4 C) (01/30 0341) Pulse Rate:  [66-89] 73 (01/30 0742) Cardiac Rhythm: Normal sinus rhythm (01/30 0400) Resp:  [8-40] 17 (01/30 0742) BP: (83-128)/(45-99) 97/58 (01/30 0742) SpO2:  [90 %-100 %] 96 % (01/30 0742) FiO2 (%):  [40 %-100 %] 50 % (01/30 0742) Weight:  [198 lb 13.7 oz (90.2 kg)] 198 lb 13.7 oz (90.2 kg) (01/30 0200)  Filed Weights   07/24/17 0500 07/25/17 0200 07/26/17 0200  Weight: 191 lb 9.3 oz (86.9 kg) 189 lb 13.1 oz (86.1 kg) 198 lb 13.7 oz (90.2 kg)    Weight change: 9 lb 0.6 oz (4.1 kg)   Hemodynamic parameters for last 24 hours: CVP:  [8 mmHg-18 mmHg] 8 mmHg  Intake/Output from previous day: 01/29 0701 - 01/30 0700 In: 2870.9 [I.V.:1655.9; Blood:345; NG/GT:150; IV Piggyback:720] Out: 4536 [Urine:3455]  Current Meds: Scheduled Meds: . acetaminophen (TYLENOL) oral liquid 160 mg/5 mL  650 mg Per Tube Once   Or  . acetaminophen  650 mg Rectal Once  . arformoterol  15 mcg Nebulization BID  . aspirin EC  325 mg Oral Daily   Or  . aspirin  324 mg Per Tube Daily  . bisacodyl  10 mg Oral Daily   Or  . bisacodyl  10 mg Rectal Daily  . budesonide (PULMICORT) nebulizer solution  0.5 mg Nebulization BID  . chlorhexidine gluconate (MEDLINE KIT)  15 mL Mouth Rinse BID  . Chlorhexidine Gluconate Cloth  6 each Topical Daily  . clonazepam  0.25 mg Oral BID  . docusate  200 mg Per Tube Daily  . enoxaparin (LOVENOX) injection  40 mg Subcutaneous Q24H  . feeding supplement (PRO-STAT SUGAR FREE 64)  30 mL Per Tube BID  . feeding  supplement (VITAL AF 1.2 CAL)  1,000 mL Per Tube Q24H  . furosemide  80 mg Intravenous BID  . Gerhardt's butt cream   Topical BID  . insulin aspart  0-24 Units Subcutaneous Q4H  . insulin detemir  10 Units Subcutaneous BID  . latanoprost  1 drop Both Eyes QHS  . levalbuterol  1.25 mg Nebulization Q6H  . mouth rinse  15 mL Mouth Rinse 10 times per day  . metoCLOPramide (REGLAN) injection  10 mg Intravenous Q6H  . naphazoline-glycerin  1-2 drop Both Eyes BID  . pantoprazole (PROTONIX) IV  40 mg Intravenous Q12H  . QUEtiapine  100 mg Per Tube QHS  . sodium chloride flush  10-40 mL Intracatheter Q12H  . sodium chloride flush  10-40 mL Intracatheter Q12H  . sodium chloride flush  3 mL Intravenous Q12H  . venlafaxine XR  75 mg Oral Daily   Continuous Infusions: . sodium chloride 250 mL (07/22/17 1820)  . amiodarone 30 mg/hr (07/26/17 0752)  . cefTAZidime (FORTAZ)  IV Stopped (07/26/17 0030)  . dexmedetomidine (PRECEDEX) IV infusion 1.7 mcg/kg/hr (07/26/17 0700)  . feeding supplement (VITAL 1.5 CAL)    .  fentaNYL infusion INTRAVENOUS 400 mcg/hr (07/26/17 0700)  . lactated ringers Stopped (07/17/17 1200)  . milrinone 0.25 mcg/kg/min (07/26/17 0754)  . norepinephrine (LEVOPHED) Adult infusion 8 mcg/min (07/26/17 0700)  . potassium chloride     PRN Meds:.metoprolol tartrate, midazolam, ondansetron (ZOFRAN) IV, sodium chloride flush  General appearance: alert, cooperative and no distress Heart: regular rate and rhythm Lungs: coarse bilaterally Abdomen: soft, non-tender; bowel sounds normal; no masses,  no organomegaly Extremities: edema 1+ Wound: clean and dry, staples remain in place on right axilla  Lab Results: CBC: Recent Labs    07/25/17 0350 07/25/17 1737 07/26/17 0341  WBC 9.8  --  9.3  HGB 8.1* 10.2* 9.1*  HCT 25.8* 30.0* 28.6*  PLT 366  --  384   BMET:  Recent Labs    07/25/17 2127 07/26/17 0341  NA 135 135  K 3.7 2.6*  CL 96* 96*  CO2 28 27  GLUCOSE 167* 115*   BUN 24* 22*  CREATININE 0.61 0.62  CALCIUM 6.3* 6.7*    CMET: Lab Results  Component Value Date   WBC 9.3 07/26/2017   HGB 9.1 (L) 07/26/2017   HCT 28.6 (L) 07/26/2017   PLT 384 07/26/2017   GLUCOSE 115 (H) 07/26/2017   CHOL 165 03/15/2017   TRIG 530 (H) 07/23/2017   HDL 57 03/15/2017   LDLCALC 89 03/15/2017   ALT 33 07/26/2017   AST 50 (H) 07/26/2017   NA 135 07/26/2017   K 2.6 (LL) 07/26/2017   CL 96 (L) 07/26/2017   CREATININE 0.62 07/26/2017   BUN 22 (H) 07/26/2017   CO2 27 07/26/2017   TSH 0.990 03/15/2017   INR 1.24 07/25/2017   HGBA1C 5.2 07/06/2017      PT/INR:  Recent Labs    07/25/17 0350  LABPROT 15.5*  INR 1.24   Radiology: Dg Chest Port 1 View  Result Date: 07/25/2017 CLINICAL DATA:  Post trach placement EXAM: PORTABLE CHEST 1 VIEW COMPARISON:  07/25/2017 at 0543 hours FINDINGS: Interval removal of endotracheal tube. Tracheostomy in satisfactory position in the mid trachea. Mild patchy left lower lobe opacity, likely atelectasis, pneumonia not excluded. Small left pleural effusion. No frank interstitial edema.  No pneumothorax. Cardiomegaly.  Prosthetic aortic valve. Left subclavian venous catheter terminating in the proximal SVC. Right chest catheter terminating in the lower SVC. Skin staples and surgical clips overlying the right chest wall/axilla. Status post ORIF of the left humerus. IMPRESSION: Interval removal of endotracheal tube. Tracheostomy in satisfactory position in the mid trachea. Mild patchy left lower lobe opacity, likely atelectasis, pneumonia not excluded. Small left pleural effusion. Otherwise stable. Electronically Signed   By: Julian Hy M.D.   On: 07/25/2017 09:47   Dg Abd Portable 1v  Result Date: 07/25/2017 CLINICAL DATA:  NG tube placement. Removal of right ventricular assist device in sternal closure 6 days ago. EXAM: PORTABLE ABDOMEN - 1 VIEW COMPARISON:  One-view abdomen 07/20/2017. FINDINGS: The tip of a small bore feeding  tube terminates in the stomach. Epicardial pacing wires are in place. Bowel gas pattern is unremarkable. IMPRESSION: The tip of a small bore feeding tube is in the stomach. Electronically Signed   By: San Morelle M.D.   On: 07/25/2017 09:56     Assessment/Plan: S/P Procedure(s) (LRB): REMOVAL OF RVAD WITH PUMP STANDBY (N/A) TRANSESOPHAGEAL ECHOCARDIOGRAM (TEE) (N/A)  1. CV- NSR, remains on Amiodarone, Milrinone, Levophed drip- will wean as hemodynamics allow and as patient is able to tolerate oral amiodarone tablets... D/c EPW 2.  Pulm- trach in place, hopefully can start trach trials today, weaning per CCM 3. Renal- creatinine WNL, + edema, weight is about 30 lbs above baseline, continue aggressive IV Lasix 4. Hypokalemia- K at 2.6, aggressive supplementation with 6 runs of IV today, repeat BMET in AM 5. GI- start tube feedings, cortrak needs to be adjusted which they will attempt today, start IV Reglan to help with gi motility as there was difficult with placement and positioning on initial placement 6. CBGs controlled, patient not a diabetic, continue SSIP for now while patient on tube feedings 7. Dispo- patient stable, start trach trials per CCM, diurese, supplement K, start tube feedings once cortrak in place, continue current care  Ellwood Handler 07/26/2017 8:15 AM   Looks good after tracheostomy Chest x-ray fairly clear Goal for today is to do some pressure support and trach weaning Will need physical therapy to help mobilize up to chair Surgical incisions and RV and cannulation sites appeared to be healing  patient needs nutrition-nutrition plan discussed with RD for coordination of care. patient examined and medical record reviewed,agree with above note. [name] 07/26/2017

## 2017-07-27 DIAGNOSIS — G9340 Encephalopathy, unspecified: Secondary | ICD-10-CM

## 2017-07-27 LAB — BPAM RBC
BLOOD PRODUCT EXPIRATION DATE: 201902072359
Blood Product Expiration Date: 201902072359
Blood Product Expiration Date: 201902082359
ISSUE DATE / TIME: 201901240918
ISSUE DATE / TIME: 201901271021
ISSUE DATE / TIME: 201901291031
UNIT TYPE AND RH: 6200
Unit Type and Rh: 6200
Unit Type and Rh: 6200

## 2017-07-27 LAB — POCT I-STAT, CHEM 8
BUN: 29 mg/dL — ABNORMAL HIGH (ref 6–20)
Calcium, Ion: 0.89 mmol/L — CL (ref 1.15–1.40)
Chloride: 92 mmol/L — ABNORMAL LOW (ref 101–111)
Creatinine, Ser: 0.7 mg/dL (ref 0.44–1.00)
Glucose, Bld: 129 mg/dL — ABNORMAL HIGH (ref 65–99)
HCT: 31 % — ABNORMAL LOW (ref 36.0–46.0)
Hemoglobin: 10.5 g/dL — ABNORMAL LOW (ref 12.0–15.0)
Potassium: 3.3 mmol/L — ABNORMAL LOW (ref 3.5–5.1)
Sodium: 138 mmol/L (ref 135–145)
TCO2: 36 mmol/L — ABNORMAL HIGH (ref 22–32)

## 2017-07-27 LAB — COMPREHENSIVE METABOLIC PANEL
ALT: 39 U/L (ref 14–54)
AST: 66 U/L — ABNORMAL HIGH (ref 15–41)
Albumin: 2.8 g/dL — ABNORMAL LOW (ref 3.5–5.0)
Alkaline Phosphatase: 171 U/L — ABNORMAL HIGH (ref 38–126)
Anion gap: 12 (ref 5–15)
BUN: 28 mg/dL — ABNORMAL HIGH (ref 6–20)
CO2: 30 mmol/L (ref 22–32)
Calcium: 6.7 mg/dL — ABNORMAL LOW (ref 8.9–10.3)
Chloride: 95 mmol/L — ABNORMAL LOW (ref 101–111)
Creatinine, Ser: 0.63 mg/dL (ref 0.44–1.00)
GFR calc Af Amer: >60 mL/min (ref >60)
GFR calc non Af Amer: >60 mL/min (ref >60)
Glucose, Bld: 134 mg/dL — ABNORMAL HIGH (ref 65–99)
Potassium: 3.1 mmol/L — ABNORMAL LOW (ref 3.5–5.1)
Sodium: 137 mmol/L (ref 135–145)
Total Bilirubin: 1.1 mg/dL (ref 0.3–1.2)
Total Protein: 5.5 g/dL — ABNORMAL LOW (ref 6.5–8.1)

## 2017-07-27 LAB — GLUCOSE, CAPILLARY
Glucose-Capillary: 115 mg/dL — ABNORMAL HIGH (ref 65–99)
Glucose-Capillary: 117 mg/dL — ABNORMAL HIGH (ref 65–99)
Glucose-Capillary: 120 mg/dL — ABNORMAL HIGH (ref 65–99)
Glucose-Capillary: 127 mg/dL — ABNORMAL HIGH (ref 65–99)
Glucose-Capillary: 151 mg/dL — ABNORMAL HIGH (ref 65–99)
Glucose-Capillary: 200 mg/dL — ABNORMAL HIGH (ref 65–99)
Glucose-Capillary: 72 mg/dL (ref 65–99)

## 2017-07-27 LAB — PHOSPHORUS: Phosphorus: 3.3 mg/dL (ref 2.5–4.6)

## 2017-07-27 LAB — TYPE AND SCREEN
ABO/RH(D): A POS
Antibody Screen: NEGATIVE
UNIT DIVISION: 0
Unit division: 0
Unit division: 0

## 2017-07-27 LAB — CBC
HCT: 29 % — ABNORMAL LOW (ref 36.0–46.0)
Hemoglobin: 9.1 g/dL — ABNORMAL LOW (ref 12.0–15.0)
MCH: 30.4 pg (ref 26.0–34.0)
MCHC: 31.4 g/dL (ref 30.0–36.0)
MCV: 97 fL (ref 78.0–100.0)
Platelets: 353 10*3/uL (ref 150–400)
RBC: 2.99 MIL/uL — ABNORMAL LOW (ref 3.87–5.11)
RDW: 17.7 % — ABNORMAL HIGH (ref 11.5–15.5)
WBC: 9.4 10*3/uL (ref 4.0–10.5)

## 2017-07-27 LAB — MAGNESIUM: Magnesium: 2.1 mg/dL (ref 1.7–2.4)

## 2017-07-27 LAB — COOXEMETRY PANEL
Carboxyhemoglobin: 1.8 % — ABNORMAL HIGH (ref 0.5–1.5)
Methemoglobin: 0.9 % (ref 0.0–1.5)
O2 Saturation: 57.6 %
Total hemoglobin: 9.2 g/dL — ABNORMAL LOW (ref 12.0–16.0)

## 2017-07-27 LAB — PROCALCITONIN: Procalcitonin: 1.14 ng/mL

## 2017-07-27 MED ORDER — POTASSIUM CHLORIDE 10 MEQ/50ML IV SOLN
10.0000 meq | INTRAVENOUS | Status: AC
  Administered 2017-07-27 (×3): 10 meq via INTRAVENOUS
  Filled 2017-07-27 (×3): qty 50

## 2017-07-27 MED ORDER — SENNOSIDES-DOCUSATE SODIUM 8.6-50 MG PO TABS
2.0000 | ORAL_TABLET | Freq: Every day | ORAL | Status: DC
  Administered 2017-07-27 – 2017-08-05 (×7): 2 via ORAL
  Filled 2017-07-27 (×8): qty 2

## 2017-07-27 MED ORDER — AMIODARONE HCL 200 MG PO TABS
200.0000 mg | ORAL_TABLET | Freq: Two times a day (BID) | ORAL | Status: DC
  Administered 2017-07-27 – 2017-08-09 (×27): 200 mg via ORAL
  Filled 2017-07-27 (×28): qty 1

## 2017-07-27 MED ORDER — CLONAZEPAM 0.5 MG PO TBDP
1.0000 mg | ORAL_TABLET | Freq: Three times a day (TID) | ORAL | Status: DC
  Administered 2017-07-27 – 2017-07-31 (×11): 1 mg via ORAL
  Filled 2017-07-27: qty 8
  Filled 2017-07-27 (×11): qty 2

## 2017-07-27 MED ORDER — ZOLPIDEM TARTRATE 5 MG PO TABS
5.0000 mg | ORAL_TABLET | Freq: Every day | ORAL | Status: DC
  Administered 2017-07-27 – 2017-07-30 (×4): 5 mg via ORAL
  Filled 2017-07-27 (×4): qty 1

## 2017-07-27 MED ORDER — MIDAZOLAM HCL 2 MG/2ML IJ SOLN
2.0000 mg | INTRAMUSCULAR | Status: DC | PRN
  Administered 2017-07-27 (×2): 2 mg via INTRAVENOUS
  Filled 2017-07-27 (×2): qty 2

## 2017-07-27 MED ORDER — LEVOTHYROXINE SODIUM 100 MCG PO TABS
100.0000 ug | ORAL_TABLET | Freq: Every day | ORAL | Status: DC
  Administered 2017-07-28 – 2017-08-12 (×15): 100 ug via ORAL
  Filled 2017-07-27 (×15): qty 1

## 2017-07-27 MED ORDER — NOREPINEPHRINE BITARTRATE 1 MG/ML IV SOLN: 0.0000 ug/min | INTRAVENOUS | Status: DC

## 2017-07-27 NOTE — Progress Notes (Signed)
RT placed pt back on vent due to severe pt agitation, pulling at trach and lines. Pt SPO2 dropped to the high 70's and RR fluctuated in between  38-47. After placing pt on the Vent with previous settings. Pt was still agitated but RR's and SPO2 started to normalize. RT to cont to monitor.

## 2017-07-27 NOTE — Progress Notes (Addendum)
Patient ID: [name], female   DOB: 04-28-1941, 77 y.o.   MRN: [id number] EVENING ROUNDS NOTE :     [address]       [place] [address]             [phone]                 10 Days Post-Op Procedure(s) (LRB): REMOVAL OF RVAD WITH PUMP STANDBY (N/A) TRANSESOPHAGEAL ECHOCARDIOGRAM (TEE) (N/A)  Total Length of Stay:  LOS: 17 days  BP 124/75   Pulse 86   Temp 97.9 F (36.6 C) (Oral)   Resp 13   Ht 5\' 5"  (1.651 m)   Wt 194 lb 10.7 oz (88.3 kg)   SpO2 96%   BMI 32.39 kg/m   .Intake/Output      01/30 0701 - 01/31 0700 01/31 0701 - 02/01 0700   I.V. (mL/kg) 1809.1 (20.5) 297.3 (3.4)   Blood     NG/GT 836.8 560   IV Piggyback 450    Total Intake(mL/kg) 3096 (35.1) 857.3 (9.7)   Urine (mL/kg/hr) 3020 (1.4) 1050 (1.1)   Emesis/NG output     Stool  0   Total Output 3020 1050   Net +76 -192.7        Stool Occurrence  1 x     . sodium chloride 250 mL (07/22/17 1820)  . dexmedetomidine (PRECEDEX) IV infusion 0.3 mcg/kg/hr (07/27/17 1122)  . feeding supplement (VITAL 1.5 CAL) 1,000 mL (07/27/17 0700)  . fentaNYL infusion INTRAVENOUS 350 mcg/hr (07/27/17 1801)  . lactated ringers Stopped (07/17/17 1200)  . milrinone 0.25 mcg/kg/min (07/27/17 1801)  . norepinephrine (LEVOPHED) Adult infusion Stopped (07/27/17 0900)     Lab Results  Component Value Date   WBC 9.4 07/27/2017   HGB 10.5 (L) 07/27/2017   HCT 31.0 (L) 07/27/2017   PLT 353 07/27/2017   GLUCOSE 129 (H) 07/27/2017   CHOL 165 03/15/2017   TRIG 530 (H) 07/23/2017   HDL 57 03/15/2017   LDLCALC 89 03/15/2017   ALT 39 07/27/2017   AST 66 (H) 07/27/2017   NA 138 07/27/2017   K 3.3 (L) 07/27/2017   CL 92 (L) 07/27/2017   CREATININE 0.70 07/27/2017   BUN 29 (H) 07/27/2017   CO2 30 07/27/2017   TSH 0.990 03/15/2017   INR 1.24 07/25/2017   HGBA1C 5.2 07/06/2017   Off levophed  On trach collar today Now agitated , may need to go back on vent tonight    Grace Isaac MD  Beeper  [phone] Office [phone] 07/27/2017 6:16 PM

## 2017-07-27 NOTE — Progress Notes (Signed)
Patient ID: [name], female   DOB: 25-Jan-1941, 77 y.o.   MRN: [id number]     Advanced Heart Failure Rounding Note  Primary Cardiologist: Dr. Percival Spanish (2014) HF: (New) Dr. Haroldine Laws   Subjective:    Events - 07/10/17 S/p AVR and ascending AA replacement  - 07/10/17 VF Arrest in evening with shock x 1 - 07/11/17 RVAD placement - 07/17/2017 RVAD removed with chest closed -1/23 Extubated. Went in A fib. Amio started.  -1/24 Reintubated.  -1/29 Trach  Coox 57.6% and milrinone 0.25 mcg/kg/min and NE 10 mcg.  On vent/trach this am at 50%. Tolerated trach collar for several hours yesterday, mostly limited by agitation. Adjustments made to Klonopin this am.   Weight down 4 lbs. Creatinine stable. K 3.1 CVP 12-13  Echo 06/02/2017 LVEF 55-60%, Grade 1 DD, Mild/Mod AI with LVOT, Mid ascending aortic diameter 48.88 mm, Mild LAE, RV normal  Echo (07/22/17): EF 65-70%, bioprosthetic aortic valve functioning normally, PASP 33 mmHg, RV looks ok.   Objective:   Weight Range: 194 lb 10.7 oz (88.3 kg) Body mass index is 32.39 kg/m.   Vital Signs:   Temp:  [97.7 F (36.5 C)-98.8 F (37.1 C)] 98.5 F (36.9 C) (01/31 0309) Pulse Rate:  [68-95] 72 (01/31 0700) Resp:  [9-30] 19 (01/31 0700) BP: (63-136)/(42-103) 94/60 (01/31 0700) SpO2:  [90 %-97 %] 97 % (01/31 0700) FiO2 (%):  [50 %-60 %] 50 % (01/31 0400) Weight:  [194 lb 10.7 oz (88.3 kg)] 194 lb 10.7 oz (88.3 kg) (01/31 0200) Last BM Date: 07/24/17  Weight change: Filed Weights   07/25/17 0200 07/26/17 0200 07/27/17 0200  Weight: 189 lb 13.1 oz (86.1 kg) 198 lb 13.7 oz (90.2 kg) 194 lb 10.7 oz (88.3 kg)    Intake/Output:   Intake/Output Summary (Last 24 hours) at 07/27/2017 0722 Last data filed at 07/27/2017 0700 Gross per 24 hour  Intake 3095.95 ml  Output 3020 ml  Net 75.95 ml    Physical Exam  CVP 12-13  General: Trached.  HEENT: Normal Neck: Supple. JVP difficult to assess. Carotids 2+ bilat; no bruits. No thyromegaly  or nodule noted. Cor: PMI nondisplaced. RRR, No M/G/R noted Lungs: CTAB, normal effort. Abdomen: Soft, non-tender, non-distended, no HSM. No bruits or masses. +BS  Extremities: No cyanosis, clubbing, or rash. BLE SCDs. Trace to 1+ edema. Neuro: Agitated. Trached.  Telemetry   NSR 80s, personally reviewed.   EKG    No new tracings.  Labs    CBC Recent Labs    07/26/17 0341 07/26/17 1500 07/27/17 0320  WBC 9.3  --  9.4  HGB 9.1* 9.9* 9.1*  HCT 28.6* 29.0* 29.0*  MCV 95.3  --  97.0  PLT 384  --  334   Basic Metabolic Panel Recent Labs    07/26/17 0341  07/26/17 1738 07/27/17 0320  NA 135   < > 135 137  K 2.6*   < > 3.8 3.1*  CL 96*   < > 96* 95*  CO2 27   < > 28 30  GLUCOSE 115*   < > 146* 134*  BUN 22*   < > 27* 28*  CREATININE 0.62   < > 0.66 0.63  CALCIUM 6.7*   < > 6.6* 6.7*  MG 1.9  --   --  2.1  PHOS 3.1  --   --  3.3   < > = values in this interval not displayed.   Liver Function Tests Recent Labs  07/26/17 0341 07/27/17 0320  AST 50* 66*  ALT 33 39  ALKPHOS 165* 171*  BILITOT 1.3* 1.1  PROT 5.1* 5.5*  ALBUMIN 2.8* 2.8*   No results for input(s): LIPASE, AMYLASE in the last 72 hours. Cardiac Enzymes Recent Labs    07/25/17 0350  CKTOTAL 173  CKMB 5.4*    BNP: BNP (last 3 results) No results for input(s): BNP in the last 8760 hours.  ProBNP (last 3 results) No results for input(s): PROBNP in the last 8760 hours.   D-Dimer No results for input(s): DDIMER in the last 72 hours. Hemoglobin A1C No results for input(s): HGBA1C in the last 72 hours. Fasting Lipid Panel No results for input(s): CHOL, HDL, LDLCALC, TRIG, CHOLHDL, LDLDIRECT in the last 72 hours. Thyroid Function Tests No results for input(s): TSH, T4TOTAL, T3FREE, THYROIDAB in the last 72 hours.  Invalid input(s): FREET3  Other results:   Imaging    Dg Abd Portable 1v  Result Date: 07/26/2017 CLINICAL DATA:  Feeding tube placement. EXAM: PORTABLE ABDOMEN - 1  VIEW COMPARISON:  07/25/2017 FINDINGS: The feeding tube tip is in the fourth portion of the duodenum likely at the duodenal jejunal junction. IMPRESSION: Feeding tube tip in good position in the fourth portion of the duodenum. Electronically Signed   By: Marijo Sanes M.D.   On: 07/26/2017 12:42    Medications:    Scheduled Medications: . acetaminophen (TYLENOL) oral liquid 160 mg/5 mL  650 mg Per Tube Once   Or  . acetaminophen  650 mg Rectal Once  . arformoterol  15 mcg Nebulization BID  . aspirin EC  325 mg Oral Daily   Or  . aspirin  324 mg Per Tube Daily  . bisacodyl  10 mg Oral Daily   Or  . bisacodyl  10 mg Rectal Daily  . budesonide (PULMICORT) nebulizer solution  0.5 mg Nebulization BID  . chlorhexidine gluconate (MEDLINE KIT)  15 mL Mouth Rinse BID  . Chlorhexidine Gluconate Cloth  6 each Topical Daily  . clonazepam  1 mg Oral BID  . docusate  200 mg Per Tube Daily  . enoxaparin (LOVENOX) injection  40 mg Subcutaneous Q24H  . feeding supplement (PRO-STAT SUGAR FREE 64)  30 mL Per Tube TID  . furosemide  80 mg Intravenous BID  . Gerhardt's butt cream   Topical BID  . insulin aspart  0-24 Units Subcutaneous Q4H  . insulin detemir  10 Units Subcutaneous BID  . latanoprost  1 drop Both Eyes QHS  . levalbuterol  1.25 mg Nebulization Q6H  . mouth rinse  15 mL Mouth Rinse QID  . naphazoline-glycerin  1-2 drop Both Eyes BID  . pantoprazole (PROTONIX) IV  40 mg Intravenous Q12H  . sodium chloride flush  10-40 mL Intracatheter Q12H  . sodium chloride flush  10-40 mL Intracatheter Q12H  . sodium chloride flush  3 mL Intravenous Q12H  . spironolactone  25 mg Oral Daily  . venlafaxine XR  75 mg Oral Daily    Infusions: . sodium chloride 250 mL (07/22/17 1820)  . amiodarone 30 mg/hr (07/27/17 0700)  . cefTAZidime (FORTAZ)  IV Stopped (07/27/17 0130)  . dexmedetomidine (PRECEDEX) IV infusion 1.7 mcg/kg/hr (07/27/17 0700)  . feeding supplement (VITAL 1.5 CAL) 1,000 mL (07/27/17  0700)  . fentaNYL infusion INTRAVENOUS 400 mcg/hr (07/27/17 0700)  . lactated ringers Stopped (07/17/17 1200)  . milrinone 0.25 mcg/kg/min (07/27/17 0700)  . norepinephrine (LEVOPHED) Adult infusion 10 mcg/min (07/27/17 0700)  .  potassium chloride 10 mEq (07/27/17 0700)    PRN Medications: metoprolol tartrate, midazolam, ondansetron (ZOFRAN) IV, sodium chloride flush    Patient Profile   [name] is a 77 y.o. female with history of fusiform ascending aortic aneurysm, HTN, and HLD.   Admitted 07/10/17 for planned AVR and replacement of ascending aortic aneurysm. Developed acute cardiogenic shock + VF arrest. Taken back to OR am of 07/11/17 for evacuation of hematoma and placement of RVAD.   Assessment/Plan   1. Cardiogenic shock s/p cardiac surgery with R>L CHF:  Pre-op Echo 06/02/2017 LVEF 55-60%, Grade 1 DD, Mild/Mod AI with LVOT, Mid ascending aortic diameter 48.88 mm, Mild LAE, RV normal.  Post-op shock, RVAD placed now s/p RVAD removal 07/17/2017.  Echo 1/26 with EF 65-70%, bioprosthetic aortic valve, improved RV function.  - Remains on milrinone 0.25 mcg and NE at 10 mcg. Wean norepi as tolerated.  - Coox 57.6%. CVP 12-13 - Continue 80 mg IV lasix BID - Supp K.  - RV function looked better on echo 07/22/16.  2. VF arrest:  - Stable. No further. Remains on amiodarone gtt.  -  Goal K > 4.0 and Mg > 2.0 - Mag 2.1 this am.  - K 3.1. Continue to supp.  3. Atrial fibrillation:  - In NSR today.  - Continue amio gtt at 30 mg per hour while on milrinone. - No anticoagulation for now. Add per Dr Darcey Nora. - Currently only getting lovenox --->DVT prophylaxis. 4. Acute respiratory failure: Reintubated 1/24.   - S/P Trach 1/29.  - On 50% 02 today. PCCM to manage agitation and then re-try wean.  - Continues on ceftazidime per CCM.  5. S/p AVR and Ascending aortic aneurysm replacement. - Post-op echo stable. 6. AKI:  - Stable. 7. Anemia:  - Hgb 9.1. Transfuse < 8.0. Stable.  -  s/p 1uPRBCs 07/23/17. 8. Hypokalemia - K 3.1. Runs ordered  Remains tenuous. As agitation is better controlled, hope to wean norepi -> milrinone. Continue to diurese today as tolerated.   Length of Stay: [facility], [place]  7:22 AM Advanced Heart Failure Team Pager [phone] (M-F; 7a - 4p)  Please contact [facility] for night-coverage after hours (4p -7a ) and weekends on [email or web address]  Patient seen with PA, agree with the above note.   She is awake/alert this morning, weaning on trach collar currently.  She is now off norepinephrine.  Creatinine stable, CVP coming down with diuresis (12-13 today).  Co-ox 57%.    Regular S1S2, she is in NSR.  Dependent crackles.  No edema.  JVP 10 cm.   I will continue Lasix 80 mg IV bid given good UOP and CVP still elevated.  Will continue milrinone at current dose today, hopefully wean down on this tomorrow.   She is in NSR.  Only had brief post-op afib so plan to hold off on anticoagulation for now.  Will transition amiodarone to 200 mg po bid.   Tania Steinhauser 07/27/2017 9:59 AM

## 2017-07-27 NOTE — Progress Notes (Signed)
10 Days Post-Op Procedure(s) (LRB): REMOVAL OF RVAD WITH PUMP STANDBY (N/A) TRANSESOPHAGEAL ECHOCARDIOGRAM (TEE) (N/A) Subjective: Progressing on trach collar Ready to mobilize OOB nsr cxr clear Hemodynamics stable  Objective: Vital signs in last 24 hours: Temp:  [97.7 F (36.5 C)-98.7 F (37.1 C)] 98.7 F (37.1 C) (01/31 0738) Pulse Rate:  [68-95] 72 (01/31 0700) Cardiac Rhythm: Normal sinus rhythm (01/31 0400) Resp:  [9-30] 19 (01/31 0700) BP: (79-132)/(42-103) 94/60 (01/31 0700) SpO2:  [90 %-97 %] 97 % (01/31 0742) FiO2 (%):  [50 %-60 %] 60 % (01/31 0802) Weight:  [194 lb 10.7 oz (88.3 kg)] 194 lb 10.7 oz (88.3 kg) (01/31 0200)  Hemodynamic parameters for last 24 hours: CVP:  [9 mmHg-18 mmHg] 13 mmHg  Intake/Output from previous day: 01/30 0701 - 01/31 0700 In: 3096 [I.V.:1809.1; NG/GT:836.8; IV Piggyback:450] Out: 3020 [Urine:3020] Intake/Output this shift: No intake/output data recorded.       Exam   Incisions clean, dry   General- alert  , anxious    Neck- no JVD, no cervical adenopathy palpable, no carotid bruit   Lungs- clear without rales, wheezes   Cor- regular rate and rhythm, no murmur , gallop   Abdomen- soft, non-tender   Extremities - warm, non-tender, minimal edema   Neuro- oriented, appropriate, no focal weakness   Lab Results: Recent Labs    07/26/17 0341 07/26/17 1500 07/27/17 0320  WBC 9.3  --  9.4  HGB 9.1* 9.9* 9.1*  HCT 28.6* 29.0* 29.0*  PLT 384  --  353   BMET:  Recent Labs    07/26/17 1738 07/27/17 0320  NA 135 137  K 3.8 3.1*  CL 96* 95*  CO2 28 30  GLUCOSE 146* 134*  BUN 27* 28*  CREATININE 0.66 0.63  CALCIUM 6.6* 6.7*    PT/INR:  Recent Labs    07/25/17 0350  LABPROT 15.5*  INR 1.24   ABG    Component Value Date/Time   PHART 7.453 (H) 07/26/2017 0430   HCO3 32.2 (H) 07/26/2017 0430   TCO2 30 07/26/2017 1500   ACIDBASEDEF 1.0 07/18/2017 0404   O2SAT 57.6 07/27/2017 0325   CBG (last 3)  Recent Labs   07/27/17 0020 07/27/17 0330 07/27/17 0732  GLUCAP 200* 151* 72    Assessment/Plan: S/P Procedure(s) (LRB): REMOVAL OF RVAD WITH PUMP STANDBY (N/A) TRANSESOPHAGEAL ECHOCARDIOGRAM (TEE) (N/A) Cont TF, diuresis,PT, Resume home anxiety meds,  Needs sitter   LOS: 17 days    [name] 07/27/2017

## 2017-07-27 NOTE — Progress Notes (Signed)
Inpatient Rehabilitation  Continuing to follow for post acute rehab needs.  Note that patient remains on the vent at night, will need to be fully off vent for IP Rehab.  Call if questions.   Carmelia Roller., CCC/SLP Admission Coordinator  [place]  Cell [phone]

## 2017-07-27 NOTE — Progress Notes (Signed)
..  Name: [name] MRN: [id number] DOB: [birth year]    ADMISSION DATE:  07/10/2017  CONSULTATION DATE:  07/12/17  REFERRING MD :  Nils Pyle MD   BRIEF:   77 year old female with past medical history significant for fusiform ascending aortic aneurysm, hypertension, hyperlipidemia, status post AVR and replacement of ascending aortic aneurysm.  On 07/10/2017 postprocedure patient developed acute cardiogenic shock had an episode of V. fib arrest was taken back to the OR on 07/11/2017 for evacuation of hematoma and placement of RVAD.  PCCM consulted for ventilator management.    SUBJECTIVE:  RN reports pt agitated overnight, restless despite 400 mcg fentanyl, 1.7 precedex.  Frequent versed pushes overnight.  No BM since 1/28.  On levo/milrinone, SvO2 57.   Afebrile.    VITAL SIGNS: Temp:  [97.7 F (36.5 C)-98.8 F (37.1 C)] 98.5 F (36.9 C) (01/31 0309) Pulse Rate:  [68-95] 82 (01/31 0600) Resp:  [9-30] 21 (01/31 0600) BP: (63-136)/(42-103) 105/70 (01/31 0600) SpO2:  [90 %-96 %] 95 % (01/31 0600) FiO2 (%):  [50 %-60 %] 50 % (01/31 0400) Weight:  [194 lb 10.7 oz (88.3 kg)] 194 lb 10.7 oz (88.3 kg) (01/31 0200)   Filed Weights   07/25/17 0200 07/26/17 0200 07/27/17 0200  Weight: 189 lb 13.1 oz (86.1 kg) 198 lb 13.7 oz (90.2 kg) 194 lb 10.7 oz (88.3 kg)    Intake/Output Summary (Last 24 hours) at 07/27/2017 0701 Last data filed at 07/27/2017 0600 Gross per 24 hour  Intake 2928.75 ml  Output 3020 ml  Net -91.25 ml     PHYSICAL EXAMINATION: General: elderly female in NAD, lying in bed on vent HEENT: MM pink/moist, #6 trach midline, sutures intact  Neuro: awakens, attempts to communicate, intermittent agitation / thrashing in bed CV: s1s2 rrr, no m/r/g, incisions clean / dry, pacing wires taped down PULM: even/non-labored, lungs bilaterally coarse  YI:RSWN, non-tender, bsx4 active  Extremities: warm/dry, no edema  Skin: no rashes or lesions  PULMONARY Recent Labs  Lab  07/22/17 0334  07/23/17 0146  07/23/17 1602 07/24/17 0257  07/24/17 0910 07/25/17 0400 07/25/17 0740 07/26/17 0351 07/26/17 0430 07/26/17 1500 07/27/17 0325  PHART 7.488*  --  7.480*  --   --  7.510*  --  7.520*  --   --   --  7.453*  --   --   PCO2ART 42.4  --  43.0  --   --  40.6  --  38.6  --   --   --  46.9  --   --   PO2ART 76.5*  --  75.0*  --   --  59.0*  --  61.0*  --   --   --  75.9*  --   --   HCO3 31.8*  --  32.1*  --   --  32.4*  --  31.5*  --   --   --  32.2*  --   --   TCO2  --    < > 33*  --  32 34*  --  33*  --   --   --   --  30  --   O2SAT 96.1   < > 96.0   < >  --  93.0   < > 93.0 40.2 47.6 57.9 94.8  --  57.6   < > = values in this interval not displayed.    CBC Recent Labs  Lab 07/25/17 0350  07/26/17 0341 07/26/17 1500 07/27/17 0320  HGB 8.1*   < > 9.1* 9.9* 9.1*  HCT 25.8*   < > 28.6* 29.0* 29.0*  WBC 9.8  --  9.3  --  9.4  PLT 366  --  384  --  353   < > = values in this interval not displayed.    COAGULATION Recent Labs  Lab 07/24/17 0332 07/25/17 0350  INR 1.14 1.24    CARDIAC  No results for input(s): TROPONINI in the last 168 hours. No results for input(s): PROBNP in the last 168 hours.   CHEMISTRY Recent Labs  Lab 07/21/17 0300 07/21/17 1541  07/23/17 1741 07/24/17 0332  07/25/17 0350  07/25/17 2127 07/26/17 0341 07/26/17 1300 07/26/17 1500 07/26/17 1738 07/27/17 0320  NA 137  --    < >  --  139   < > 138   < > 135 135 137 138 135 137  K 2.9*  --    < >  --  3.1*   < > 3.1*   < > 3.7 2.6* 3.1* 3.2* 3.8 3.1*  CL 94*  --    < >  --  95*  --  98*  --  96* 96* 97* 94* 96* 95*  CO2 28  --    < >  --  29  --  28  --  28 27 26   --  28 30  GLUCOSE 182*  --    < >  --  106*   < > 162*   < > 167* 115* 153* 163* 146* 134*  BUN 28*  --    < >  --  28*  --  30*  --  24* 22* 24* 24* 27* 28*  CREATININE 0.93  --    < >  --  0.74  --  0.71  --  0.61 0.62 0.63 0.60 0.66 0.63  CALCIUM 6.5*  --    < >  --  6.9*  --  6.5*  --  6.3* 6.7*  6.7*  --  6.6* 6.7*  MG 2.0 1.8  --  1.7 2.2  --  1.8  --   --  1.9  --   --   --  2.1  PHOS 3.7 3.4  --   --   --   --  3.2  --   --  3.1  --   --   --  3.3   < > = values in this interval not displayed.   Estimated Creatinine Clearance: 66.7 mL/min (by C-G formula based on SCr of 0.63 mg/dL).   LIVER Recent Labs  Lab 07/23/17 0307 07/24/17 0332 07/25/17 0350 07/26/17 0341 07/27/17 0320  AST 55* 57* 48* 50* 66*  ALT 37 39 33 33 39  ALKPHOS 197* 222* 194* 165* 171*  BILITOT 1.1 1.2 1.5* 1.3* 1.1  PROT 5.5* 5.8* 5.6* 5.1* 5.5*  ALBUMIN 2.9* 2.9* 3.0* 2.8* 2.8*  INR  --  1.14 1.24  --   --     INFECTIOUS Recent Labs  Lab 07/24/17 2300 07/26/17 0341 07/27/17 0320  LATICACIDVEN 0.7  --   --   PROCALCITON  --  0.52 1.14    ENDOCRINE CBG (last 3)  Recent Labs    07/26/17 1927 07/27/17 0020 07/27/17 0330  GLUCAP 162* 200* 151*     IMAGING  Dg Chest Port 1 View  Result Date: 07/26/2017 CLINICAL DATA:  Right ventricular assistance device. EXAM: PORTABLE CHEST 1 VIEW COMPARISON:  Radiograph of  July 25, 2017. FINDINGS: Tracheostomy tube is in grossly good position. Feeding tube is unchanged in position. Right-sided PICC line and left subclavian catheters are unchanged in position. Status post aortic valve replacement. No pneumothorax is noted. Mild bibasilar subsegmental atelectasis is noted. Minimal left pleural effusion is noted. Bony thorax is unremarkable. IMPRESSION: Stable support apparatus. Stable bibasilar subsegmental atelectasis with minimal left pleural effusion. Electronically Signed   By: Marijo Conception, M.D.   On: 07/26/2017 08:56   Dg Chest Port 1 View  Result Date: 07/25/2017 CLINICAL DATA:  Post trach placement EXAM: PORTABLE CHEST 1 VIEW COMPARISON:  07/25/2017 at 0543 hours FINDINGS: Interval removal of endotracheal tube. Tracheostomy in satisfactory position in the mid trachea. Mild patchy left lower lobe opacity, likely atelectasis, pneumonia not  excluded. Small left pleural effusion. No frank interstitial edema.  No pneumothorax. Cardiomegaly.  Prosthetic aortic valve. Left subclavian venous catheter terminating in the proximal SVC. Right chest catheter terminating in the lower SVC. Skin staples and surgical clips overlying the right chest wall/axilla. Status post ORIF of the left humerus. IMPRESSION: Interval removal of endotracheal tube. Tracheostomy in satisfactory position in the mid trachea. Mild patchy left lower lobe opacity, likely atelectasis, pneumonia not excluded. Small left pleural effusion. Otherwise stable. Electronically Signed   By: Julian Hy M.D.   On: 07/25/2017 09:47   Dg Abd Portable 1v  Result Date: 07/26/2017 CLINICAL DATA:  Feeding tube placement. EXAM: PORTABLE ABDOMEN - 1 VIEW COMPARISON:  07/25/2017 FINDINGS: The feeding tube tip is in the fourth portion of the duodenum likely at the duodenal jejunal junction. IMPRESSION: Feeding tube tip in good position in the fourth portion of the duodenum. Electronically Signed   By: Marijo Sanes M.D.   On: 07/26/2017 12:42   Dg Abd Portable 1v  Result Date: 07/25/2017 CLINICAL DATA:  NG tube placement. Removal of right ventricular assist device in sternal closure 6 days ago. EXAM: PORTABLE ABDOMEN - 1 VIEW COMPARISON:  One-view abdomen 07/20/2017. FINDINGS: The tip of a small bore feeding tube terminates in the stomach. Epicardial pacing wires are in place. Bowel gas pattern is unremarkable. IMPRESSION: The tip of a small bore feeding tube is in the stomach. Electronically Signed   By: San Morelle M.D.   On: 07/25/2017 09:56   STUDIES: Echo12/12/2016 LVEF 55-60%, Grade 1 DD, Mild/Mod AI with LVOT, Mid ascending aortic diameter 48.88 mm, Mild LAE, RV normal Echo (07/22/17): EF 65-70%, bioprosthetic aortic valve functioning normally, PASP 33 mmHg, RV looks ok.   CULTURES 1/24 sputum >> oral flora  ANTIBIOTICS Ceftaz1/17 >> 1.20 Cefuroxime 1/21 >>  1/23 Ceftaz 1/23 >> 1/31  EVENTS 1/14  Admit 1/14 S/p AVR and ascending AA replacement  1/14 VF Arrest in evening with shock x 1 1/15 to OR for evacuation of hematoma and RVAD placement  1/16 PCCM consult 1/19 Lasix per cardiology CVP of 5 1/21 RVAD removed 1/22 heavily diuresed  1/23 ready for extubation. Atrial fib amio started.  1/24 reintubated 1/25  Afebrile 1/29  trach placed  ASSESSMENT / PLAN: 77 year old female with PMHx significant for fusiform ascending aortic aneurysm, hypertension, hyperlipidemia, status post AVR and replacement of ascending aortic aneurysm.  On 07/10/2017 postprocedure patient developed acute cardiogenic shock had an episode of V. fib arrest was taken back to the OR on 07/11/2017 for evacuation of hematoma and placement of RVAD.  RVAD now removed, failed extubation 1/24 but continued to wean well. Underwent tracheostomy 1/29.  Course complicated by agitated delirium.  ASSESSMENT / PLAN:  Acute hypoxic respiratory failure - initially in setting of cardiogenic shock, currently mechanism seems to be atelectasis, shock, poor cough mechanics, deconditioning +/- HF Tracheostomy Status - placed 1/29 P: ATC wean as tolerated  PRVC 8 cc/kg for rest mode  Intermittent CXR Wean FiO2 for sats > 90% Mobilize as able  [place] BID    HCAP/VAP  P: Monitor off abx  Follow intermittent CXR, fever curve / WBC trend    Cardiogenic shock - s/p removal of RVAD 1/21; sternum now closed Atrial fibrillation Medication related hypotension.  Back on levophed since started on propofol VF arrest, brief, immediately post op - no further incidence. Prolonged qT P: Levo / Milrinone per CHF Service Continue amiodarone gtt  CVTS managing post op plan of care    Constipation - no BM since 1/28 P: Senakot-S 2 tabs QD  Dulcolax QD Mobilize   Acute Encephalopathy P: RASS GOAL: 0 to -1  Fentanyl gtt for pain / sedation  Attempt to wean precedex gtt to off > does not  seem to be working for her Increased Klonopin to 1mg  PT Q8 PRN versed   FAMILY  - Updates: No family at bedside on am rounds 1/31.     Summary - main issue now impeding weaning to trach collar seems to be severe delirium not responding to Precedex, will up titrate Klonopin, unfortunately prolonged QT will not tolerate addition of medication such as Seroquel.  Hoping to mobilize and trial trach collar to see if this will help with delirium.  Have discussed weaning precedex off with RN, lights on, sitting up / awake during the day.    Noe Gens, NP-C [facility] Pgr: [phone] or if no answer [phone] 07/27/2017, 7:37 AM

## 2017-07-27 NOTE — Progress Notes (Signed)
Physical Therapy Treatment Patient Details Name: [name] MRN: [id number] DOB: [birth year] Today's Date: 07/27/2017    History of Present Illness Pt admitted 07/10/17 for AVR and ascending AA replacement. Post procedure had VF arrest. 07/11/17 back to OR to evacuate hematoma and place RVAD due to fluid overload and R systolic HF. RVAD subsequently removed. Pt intubated from 07/10/17-07/19/17. Re-intubated 1/24-1/29; trach placed 1/29. PMH: PVD, afib, vfib, hypothyroidism, anxiety and depression, HTN, HLD, R TKA.    PT Comments    Pt restless on arrival kicking and moving legs around in bed.  Pt in B wrist restraints and removed these for mobility and transfer training.  Pt requires +2 assistance to sit edge of bed and partially stand.  Tx complicated by bowel incontinence.  Pt fatigued during session and returned to supine for perianal care.  Plan next session to continue functional mobility.  CIR remains appropriate as patient continues to improve medically.     Follow Up Recommendations  CIR(CIR pending medical appropriateness)     Equipment Recommendations  (TBD at next venue)    Recommendations for Other Services Rehab consult     Precautions / Restrictions Precautions Precautions: Fall;Sternal Precaution Comments: reinforced sternal precautions during bed mobility Restrictions Weight Bearing Restrictions: Yes Other Position/Activity Restrictions: sternal precautions    Mobility  Bed Mobility Overal bed mobility: Needs Assistance Bed Mobility: Supine to Sit;Sit to Supine     Supine to sit: Max assist;+2 for physical assistance Sit to supine: Total assist;+2 for physical assistance   General bed mobility comments: Pt performed helicopter transfer from supine to sit.  Pt with cues for hand placement to maintain sternal precautions.  Pt able to sit edge of bed with with mod to max assistance.    Transfers Overall transfer level: Needs assistance Equipment used: 2 person  hand held assist Transfers: Sit to/from Stand Sit to Stand: Max assist;+2 physical assistance         General transfer comment: Pt perform partial sit to stand with poor quad contraction and ability to stand.  Pt flexed with clearance of bottom from bed.  Upon standing after 2nd trial patient started to have loose BM and required return to bed for clean up and pericare.  Pt remains restless and impulsive and limited to follow commands during session.    Ambulation/Gait Ambulation/Gait assistance: (not able.  standing tolerance and form still needs improvement to advance to gait training.  )               Stairs            Wheelchair Mobility    Modified Rankin (Stroke Patients Only)       Balance Overall balance assessment: Needs assistance   Sitting balance-Leahy Scale: Poor       Standing balance-Leahy Scale: Zero                              Cognition Arousal/Alertness: Awake/alert Behavior During Therapy: Restless;Impulsive Overall Cognitive Status: Difficult to assess Area of Impairment: Attention;Following commands;Safety/judgement                   Current Attention Level: Sustained   Following Commands: Follows one step commands with increased time;Follows one step commands consistently Safety/Judgement: Decreased awareness of safety     General Comments: Overall, interacting appropriately; very clearly aggravated by amount of lines and trach/vent, more impulsive today.  Exercises      General Comments        Pertinent Vitals/Pain Pain Assessment: Faces Faces Pain Scale: No hurt(does not appear to be in pain dutring session )    Home Living                      Prior Function            PT Goals (current goals can now be found in the care plan section) Acute Rehab PT Goals Patient Stated Goal: to get out of bed Potential to Achieve Goals: Good Progress towards PT goals: Progressing toward  goals    Frequency    Min 3X/week      PT Plan Current plan remains appropriate    Co-evaluation              AM-PAC PT "6 Clicks" Daily Activity  Outcome Measure  Difficulty turning over in bed (including adjusting bedclothes, sheets and blankets)?: Unable Difficulty moving from lying on back to sitting on the side of the bed? : Unable Difficulty sitting down on and standing up from a chair with arms (e.g., wheelchair, bedside commode, etc,.)?: Unable Help needed moving to and from a bed to chair (including a wheelchair)?: A Lot Help needed walking in hospital room?: Total Help needed climbing 3-5 steps with a railing? : Total 6 Click Score: 7    End of Session Equipment Utilized During Treatment: Oxygen;Gait belt(gait belt placed high to avoid incisions.  ) Activity Tolerance: Patient limited by fatigue(Bowel complications.  ) Patient left: in bed;with call bell/phone within reach;with bed alarm set;with family/visitor present Nurse Communication: Mobility status PT Visit Diagnosis: Other abnormalities of gait and mobility (R26.89);Muscle weakness (generalized) (M62.81)     Time: 7412-8786 PT Time Calculation (min) (ACUTE ONLY): 24 min  Charges:  $Therapeutic Activity: 23-37 mins                    G CodesGovernor Rooks, PTA pager [phone]    Cristela Blue 07/27/2017, 11:52 AM

## 2017-07-27 NOTE — Progress Notes (Signed)
Pt taken off vent and placed on 60% ATC for wean.  Pt is tolerating well.  Sats 96%, HR 82, rr 22. RN at bedside.  RT will continue to monitor and wean as tolerated.

## 2017-07-27 NOTE — Evaluation (Signed)
[facility] Speaking Valve - Evaluation Patient Details  Name: [name] MRN: [id number] Date of Birth: [birth year]  Today's Date: 07/27/2017 Time: 2774-1287 SLP Time Calculation (min) (ACUTE ONLY): 26 min  Past Medical History:  Past Medical History:  Diagnosis Date  . ACL tear    right  Dr. Gladstone Pih   . Adenomatous polyp   . Anxiety   . Aorta aneurysm ([place])   . Arthritis   . Ascending aortic aneurysm ([place])    note per chart per Dr Lucianne Lei Tright 4.7 cm 04/15/2015   . B12 deficiency   . Cancer ([place])   . Cataracts, both eyes 10/2006  . Chronic bronchitis ([place])   . Constipation   . Depression   . DUB (dysfunctional uterine bleeding) 10/96  . ETD (eustachian tube dysfunction)   . Fall   . Fibromyalgia   . GERD (gastroesophageal reflux disease)   . Glaucoma    ([place]) Dr. Arnoldo Morale   . Glaucoma    bilaterally  . Helicobacter pylori (H. pylori)   . Hiatal hernia   . History of bronchitis   . History of frequent urinary tract infections   . History of kidney stones   . History of left shoulder fracture    pt states fell off bed and broke ball in shoulder had rod placed   . Hyperlipidemia   . Hypertension   . Hypothyroidism   . Insomnia   . Leg wound, left    healed   . MVP (mitral valve prolapse)   . Occasional tremors    left arm   . Osteoarthritis   . Osteopenia   . Osteoporosis   . Other and unspecified hyperlipidemia   . Post-menopausal   . Thyroid cancer ([place])   . Thyroid nodule   . Tremor    Past Surgical History:  Past Surgical History:  Procedure Laterality Date  . AORTIC VALVE REPLACEMENT N/A 07/10/2017   Procedure: AORTIC VALVE REPLACEMENT (AVR);  Surgeon: [name], Collier Salina, MD;  Location: [place];  Service: Open Heart Surgery;  Laterality: N/A;  . APPENDECTOMY    . BUNIONECTOMY  08/1999   right - Dr. Irving Shows   . CHOLECYSTECTOMY  1979  . DILATION AND CURETTAGE OF UTERUS  03/31/95   Dr. Ovid Curd   . EXPLORATION POST OPERATIVE OPEN HEART N/A 07/11/2017   Procedure: EXPLORATION POST OPERATIVE OPEN HEART;  Surgeon: Ivin Poot, MD;  Location: [place];  Service: Open Heart Surgery;  Laterality: N/A;  . EYE SURGERY     also had left cataract removed   . FRACTURE SURGERY    . HEMATOMA EVACUATION N/A 07/11/2017   Procedure: EVACUATION HEMATOMA;  Surgeon: Ivin Poot, MD;  Location: [place];  Service: Open Heart Surgery;  Laterality: N/A;  . HERNIA REPAIR    . NEPHROLITHOTOMY Left 04/07/2017   Procedure: NEPHROLITHOTOMY PERCUTANEOUS WITH SURGEON ACCESS;  Surgeon: Ardis Hughs, MD;  Location: [facility];  Service: Urology;  Laterality: Left;  . ORIF HUMERUS FRACTURE  05/30/2011   Procedure: OPEN REDUCTION INTERNAL FIXATION (ORIF) PROXIMAL HUMERUS FRACTURE;  Surgeon: Augustin Schooling;  Location: [place];  Service: Orthopedics;  Laterality: Left;  open reduction internal fixation of proximal humerus fracture  . PLACEMENT OF CENTRIMAG VENTRICULAR ASSIST DEVICE Right 07/11/2017   Procedure: PLACEMENT OF CENTRIMAG VENTRICULAR ASSIST DEVICE;  Surgeon: Ivin Poot, MD;  Location: [place];  Service: Open Heart Surgery;  Laterality: Right;  . REMOVAL OF CENTRIMAG VENTRICULAR ASSIST DEVICE N/A 07/17/2017  Procedure: REMOVAL OF RVAD WITH PUMP STANDBY;  Surgeon: Ivin Poot, MD;  Location: [place];  Service: Open Heart Surgery;  Laterality: N/A;  . REPLACEMENT ASCENDING AORTA N/A 07/10/2017   Procedure: REPLACEMENT ASCENDING AORTA;  Surgeon: Ivin Poot, MD;  Location: [place];  Service: Open Heart Surgery;  Laterality: N/A;  . right knee replacement  3/11   Dr. Gladstone Pih  . RIGHT/LEFT HEART CATH AND CORONARY ANGIOGRAPHY N/A 06/13/2017   Procedure: RIGHT/LEFT HEART CATH AND CORONARY ANGIOGRAPHY;  Surgeon: Jolaine Artist, MD;  Location: [place] [facility];  Service: Cardiovascular;  Laterality: N/A;  . rt. eye cataract  05/28/07  . TEE WITHOUT CARDIOVERSION N/A 07/10/2017   Procedure: TRANSESOPHAGEAL ECHOCARDIOGRAM (TEE);  Surgeon: [name], Collier Salina,  MD;  Location: [place];  Service: Open Heart Surgery;  Laterality: N/A;  . TEE WITHOUT CARDIOVERSION  07/11/2017   Procedure: TRANSESOPHAGEAL ECHOCARDIOGRAM (TEE);  Surgeon: [name], Collier Salina, MD;  Location: [place];  Service: Open Heart Surgery;;  . TEE WITHOUT CARDIOVERSION N/A 07/17/2017   Procedure: TRANSESOPHAGEAL ECHOCARDIOGRAM (TEE);  Surgeon: [name], Collier Salina, MD;  Location: [place];  Service: Open Heart Surgery;  Laterality: N/A;  . THYROID LOBECTOMY Left 07/27/2015   Procedure: LEFT THYROID LOBECTOMY;  Surgeon: Fanny Skates, MD;  Location[facility];  Service: General;  Laterality: Left;  . THYROIDECTOMY N/A 08/06/2015   Procedure: RIGHT THYROID LOBECTOMY, REIMPLANTATION PARATHYROID;  Surgeon: Fanny Skates, MD;  Location[facility];  Service: General;  Laterality: N/A;  . VENTRAL HERNIA REPAIR  2/99   HPI:  Pt admitted 07/10/17 for AVR and ascending AA replacement. Post procedure had VF arrest. 07/11/17 back to OR to evacuate hematoma and place RVAD due to fluid overload and R systolic HF. RVAD subsequently removed. Pt intubated from 07/10/17-07/19/17. Re-intubated 1/24-1/29; trach placed 1/29. PMH: PVD, afib, vfib, hypothyroidism, anxiety and depression, HTN, HLD, R TKA.   Assessment / Plan / Recommendation Clinical Impression  Initial resistance for PMV resulting in valve falling off or pt coughing valve from trach hub. Valve remained in place after first 5  minutes for total of approximately 15 minutes. HR 103, RR 16 and SpO2 93%. Required RN to suction following cuff deflation due to inability to completely express secretions. Pt restless prior to valve, cooperative and confused needing intermittent redirection. Hoarse vocal quality with adequate intensity and intelligibility and adequate breath support for verbalizations. Adequate ability to coordinate respirations and conversation. Recommend wear valve with ST only and will return for intervention and likely recommending increased usage of valve  throughout the day and swallow assessment when appropriate.    SLP Visit Diagnosis: Aphonia (R49.1)    SLP Assessment  Patient needs continued Speech Lanaguage Pathology Services    Follow Up Recommendations  Inpatient Rehab    Frequency and Duration min 2x/week  2 weeks    PMSV Trial PMSV was placed for: 15 min Able to redirect subglottic air through upper airway: Yes Able to Attain Phonation: Yes Voice Quality: Hoarse Able to Expectorate Secretions: Yes Level of Secretion Expectoration with PMSV: Oral Breath Support for Phonation: Adequate Intelligibility: Intelligible Respirations During Trial: 16 SpO2 During Trial: 93 % Pulse During Trial: 103 Behavior: Alert;Controlled;Confused;Cooperative;Responsive to questions   Tracheostomy Tube       Vent Dependency  Vent Mode: Spontaneous;Stand-by FiO2 (%): (S) 60 %    Cuff Deflation Trial  GO Tolerated Cuff Deflation: Yes Length of Time for Cuff Deflation Trial: 23 min Behavior: Alert;Confused;Cooperative;Responsive to questions;Smiling  [name] 07/27/2017, 3:34 PM [name].Ed [employer] [phone]

## 2017-07-28 ENCOUNTER — Inpatient Hospital Stay (HOSPITAL_COMMUNITY): Payer: Medicare Other

## 2017-07-28 LAB — POCT I-STAT, CHEM 8
BUN: 31 mg/dL — ABNORMAL HIGH (ref 6–20)
Calcium, Ion: 0.91 mmol/L — ABNORMAL LOW (ref 1.15–1.40)
Chloride: 92 mmol/L — ABNORMAL LOW (ref 101–111)
Creatinine, Ser: 0.6 mg/dL (ref 0.44–1.00)
Glucose, Bld: 132 mg/dL — ABNORMAL HIGH (ref 65–99)
HCT: 32 % — ABNORMAL LOW (ref 36.0–46.0)
Hemoglobin: 10.9 g/dL — ABNORMAL LOW (ref 12.0–15.0)
Potassium: 2.8 mmol/L — ABNORMAL LOW (ref 3.5–5.1)
Sodium: 141 mmol/L (ref 135–145)
TCO2: 37 mmol/L — ABNORMAL HIGH (ref 22–32)

## 2017-07-28 LAB — COMPREHENSIVE METABOLIC PANEL
ALT: 45 U/L (ref 14–54)
AST: 70 U/L — ABNORMAL HIGH (ref 15–41)
Albumin: 2.9 g/dL — ABNORMAL LOW (ref 3.5–5.0)
Alkaline Phosphatase: 182 U/L — ABNORMAL HIGH (ref 38–126)
Anion gap: 13 (ref 5–15)
BUN: 28 mg/dL — ABNORMAL HIGH (ref 6–20)
CO2: 31 mmol/L (ref 22–32)
Calcium: 7.1 mg/dL — ABNORMAL LOW (ref 8.9–10.3)
Chloride: 96 mmol/L — ABNORMAL LOW (ref 101–111)
Creatinine, Ser: 0.63 mg/dL (ref 0.44–1.00)
GFR calc Af Amer: >60 mL/min (ref >60)
GFR calc non Af Amer: >60 mL/min (ref >60)
Glucose, Bld: 132 mg/dL — ABNORMAL HIGH (ref 65–99)
Potassium: 4.1 mmol/L (ref 3.5–5.1)
Sodium: 140 mmol/L (ref 135–145)
Total Bilirubin: 1.5 mg/dL — ABNORMAL HIGH (ref 0.3–1.2)
Total Protein: 5.8 g/dL — ABNORMAL LOW (ref 6.5–8.1)

## 2017-07-28 LAB — PHOSPHORUS: Phosphorus: 2 mg/dL — ABNORMAL LOW (ref 2.5–4.6)

## 2017-07-28 LAB — GLUCOSE, CAPILLARY
Glucose-Capillary: 148 mg/dL — ABNORMAL HIGH (ref 65–99)
Glucose-Capillary: 113 mg/dL — ABNORMAL HIGH (ref 65–99)
Glucose-Capillary: 105 mg/dL — ABNORMAL HIGH (ref 65–99)
Glucose-Capillary: 118 mg/dL — ABNORMAL HIGH (ref 65–99)
Glucose-Capillary: 124 mg/dL — ABNORMAL HIGH (ref 65–99)

## 2017-07-28 LAB — CBC
HCT: 32.5 % — ABNORMAL LOW (ref 36.0–46.0)
Hemoglobin: 9.9 g/dL — ABNORMAL LOW (ref 12.0–15.0)
MCH: 29.8 pg (ref 26.0–34.0)
MCHC: 30.5 g/dL (ref 30.0–36.0)
MCV: 97.9 fL (ref 78.0–100.0)
Platelets: 434 10*3/uL — ABNORMAL HIGH (ref 150–400)
RBC: 3.32 MIL/uL — ABNORMAL LOW (ref 3.87–5.11)
RDW: 17 % — ABNORMAL HIGH (ref 11.5–15.5)
WBC: 10.3 10*3/uL (ref 4.0–10.5)

## 2017-07-28 LAB — COOXEMETRY PANEL
Carboxyhemoglobin: 1.3 % (ref 0.5–1.5)
Methemoglobin: 1.1 % (ref 0.0–1.5)
O2 Saturation: 65.1 %
Total hemoglobin: 12.2 g/dL (ref 12.0–16.0)

## 2017-07-28 LAB — MAGNESIUM: Magnesium: 1.8 mg/dL (ref 1.7–2.4)

## 2017-07-28 MED ORDER — QUETIAPINE FUMARATE 100 MG PO TABS
100.0000 mg | ORAL_TABLET | Freq: Every day | ORAL | Status: DC
  Administered 2017-07-28 – 2017-07-31 (×4): 100 mg via ORAL
  Filled 2017-07-28 (×4): qty 1

## 2017-07-28 MED ORDER — POTASSIUM CHLORIDE 10 MEQ/50ML IV SOLN
10.0000 meq | INTRAVENOUS | Status: AC
Start: 2017-07-28 — End: 2017-07-28
  Administered 2017-07-28 (×2): 10 meq via INTRAVENOUS
  Filled 2017-07-28: qty 50

## 2017-07-28 MED ORDER — HALOPERIDOL LACTATE 5 MG/ML IJ SOLN: 4.0000 mg | Freq: Four times a day (QID) | INTRAMUSCULAR | Status: DC | PRN

## 2017-07-28 MED ORDER — MILRINONE LACTATE IN DEXTROSE 20-5 MG/100ML-% IV SOLN
0.1250 ug/kg/min | INTRAVENOUS | Status: DC
  Administered 2017-07-28 – 2017-07-29 (×2): 0.125 ug/kg/min via INTRAVENOUS
  Filled 2017-07-28 (×2): qty 100

## 2017-07-28 MED ORDER — HALOPERIDOL LACTATE 5 MG/ML IJ SOLN
4.0000 mg | Freq: Three times a day (TID) | INTRAMUSCULAR | Status: DC | PRN
  Administered 2017-07-29 – 2017-08-02 (×3): 4 mg via INTRAVENOUS
  Filled 2017-07-28 (×3): qty 1

## 2017-07-28 MED ORDER — POTASSIUM CHLORIDE 10 MEQ/50ML IV SOLN
10.0000 meq | INTRAVENOUS | Status: AC | PRN
  Administered 2017-07-28 (×3): 10 meq via INTRAVENOUS
  Filled 2017-07-28 (×3): qty 50

## 2017-07-28 MED ORDER — MAGNESIUM SULFATE 2 GM/50ML IV SOLN
2.0000 g | Freq: Once | INTRAVENOUS | Status: AC
  Administered 2017-07-28: 2 g via INTRAVENOUS
  Filled 2017-07-28: qty 50

## 2017-07-28 MED ORDER — POTASSIUM CHLORIDE 10 MEQ/50ML IV SOLN
10.0000 meq | INTRAVENOUS | Status: AC
  Administered 2017-07-28 (×3): 10 meq via INTRAVENOUS
  Filled 2017-07-28 (×3): qty 50

## 2017-07-28 NOTE — Progress Notes (Signed)
Physical Therapy Treatment Patient Details Name: [name] MRN: [id number] DOB: 12-28-1940 Today's Date: 07/28/2017    History of Present Illness Pt admitted 07/10/17 for AVR and ascending AA replacement. Post procedure had VF arrest. 07/11/17 back to OR to evacuate hematoma and place RVAD due to fluid overload and R systolic HF. RVAD subsequently removed. Pt intubated from 07/10/17-07/19/17. Re-intubated 1/24-1/29; trach placed 1/29. PMH: PVD, afib, vfib, hypothyroidism, anxiety and depression, HTN, HLD, R TKA.   PT Comments    Pt progressing with mobility. Reliant on maxA+2 for bed mobility and standing trials; pt demonstrates some BLE activation, improving with each trial, but still unable to achieve fully upright standing this session. Pt motivated to participate and following directions; intermittent redirection to task and cues to keep from pulling at lines. Continue to recommend CIR-level therapies pending medical appropriateness. Will follow acutely.    Follow Up Recommendations  CIR(pending medical appropriateness)     Equipment Recommendations  (TBD next venue)    Recommendations for Other Services       Precautions / Restrictions Precautions Precautions: Fall;Sternal Precaution Comments: reinforced sternal precautions during bed mobility Restrictions Weight Bearing Restrictions: Yes(sternal precautions) Other Position/Activity Restrictions: sternal precautions    Mobility  Bed Mobility Overal bed mobility: Needs Assistance Bed Mobility: Rolling;Sidelying to Sit;Sit to Sidelying Rolling: Max assist;+2 for physical assistance Sidelying to sit: Max assist;+2 for physical assistance;HOB elevated     Sit to sidelying: Max assist;+2 for physical assistance    Transfers Overall transfer level: Needs assistance Equipment used: 2 person hand held assist Transfers: Sit to/from Stand Sit to Stand: Max assist;+2 physical assistance         General transfer comment: Pt  performed partial sit<>stand 3x with BUE support and maxA+2; increased LE quad and glut activation with each trial, but pt unable to achieve fully upright standing. Sitting rest break in between trials. Pt motivated to participate and following commands appropriately, but easily fatigued ready to lay back down  Ambulation/Gait             General Gait Details: No safe to advance gait training at this time   Stairs            Wheelchair Mobility    Modified Rankin (Stroke Patients Only)       Balance Overall balance assessment: Needs assistance Sitting-balance support: Feet supported;Bilateral upper extremity supported Sitting balance-Leahy Scale: Fair Sitting balance - Comments: Pt able to sit with intermittent min guard, reliant on BUE support. With fatigue, requires modA for posterior support     Standing balance-Leahy Scale: Zero                              Cognition Arousal/Alertness: Awake/alert Behavior During Therapy: Restless;Impulsive Overall Cognitive Status: Difficult to assess Area of Impairment: Attention;Following commands;Safety/judgement                   Current Attention Level: Sustained   Following Commands: Follows one step commands with increased time;Follows one step commands consistently Safety/Judgement: Decreased awareness of safety     General Comments: Overall, interacting appropriately; very clearly aggravated by amount of lines and trach/vent      Exercises      General Comments        Pertinent Vitals/Pain Pain Assessment: Faces Faces Pain Scale: Hurts little more Pain Location: Discomfort at trach site Pain Descriptors / Indicators: Grimacing;Guarding Pain Intervention(s): Monitored during session  Home Living                      Prior Function            PT Goals (current goals can now be found in the care plan section) Acute Rehab PT Goals Patient Stated Goal: Trach out PT Goal  Formulation: With patient Time For Goal Achievement: 08/09/17 Potential to Achieve Goals: Good Progress towards PT goals: Progressing toward goals    Frequency    Min 3X/week      PT Plan Current plan remains appropriate    Co-evaluation PT/OT/SLP Co-Evaluation/Treatment: Yes Reason for Co-Treatment: Complexity of the patient's impairments (multi-system involvement);For patient/therapist safety;To address functional/ADL transfers PT goals addressed during session: Mobility/safety with mobility;Balance        AM-PAC PT "6 Clicks" Daily Activity  Outcome Measure  Difficulty turning over in bed (including adjusting bedclothes, sheets and blankets)?: Unable Difficulty moving from lying on back to sitting on the side of the bed? : Unable Difficulty sitting down on and standing up from a chair with arms (e.g., wheelchair, bedside commode, etc,.)?: Unable Help needed moving to and from a bed to chair (including a wheelchair)?: Total Help needed walking in hospital room?: Total Help needed climbing 3-5 steps with a railing? : Total 6 Click Score: 6    End of Session Equipment Utilized During Treatment: Oxygen Activity Tolerance: Patient tolerated treatment well;Patient limited by fatigue Patient left: in bed;with call bell/phone within reach;with bed alarm set;with nursing/sitter in room Nurse Communication: Mobility status PT Visit Diagnosis: Other abnormalities of gait and mobility (R26.89);Muscle weakness (generalized) (M62.81)     Time: 8832-5498 PT Time Calculation (min) (ACUTE ONLY): 26 min  Charges:  $Therapeutic Activity: 8-22 mins                    G Codes:      Mabeline Caras, PT, DPT Acute Rehab Services  Pager: [place] 07/28/2017, 8:58 AM

## 2017-07-28 NOTE — Progress Notes (Signed)
..  Name: [name] MRN: [id number] DOB: [birth year]    ADMISSION DATE:  07/10/2017  CONSULTATION DATE:  07/12/17  REFERRING MD :  Nils Pyle MD   BRIEF:   77 year old female with past medical history significant for fusiform ascending aortic aneurysm, hypertension, hyperlipidemia, status post AVR and replacement of ascending aortic aneurysm.  On 07/10/2017 postprocedure patient developed acute cardiogenic shock had an episode of V. fib arrest was taken back to the OR on 07/11/2017 for evacuation of hematoma and placement of RVAD.  PCCM consulted for ventilator management.    SUBJECTIVE:  RN reports pt having intermittent agitation despite increased klonopin / precedex.   Afebrile.     VITAL SIGNS: Temp:  [97.9 F (36.6 C)-99 F (37.2 C)] 98.5 F (36.9 C) (02/01 0803) Pulse Rate:  [73-107] 80 (02/01 1106) Resp:  [8-26] 12 (02/01 1106) BP: (91-176)/(54-136) 103/57 (02/01 1100) SpO2:  [90 %-100 %] 97 % (02/01 1100) FiO2 (%):  [40 %-60 %] 60 % (02/01 1106) Weight:  [194 lb 10.7 oz (88.3 kg)] 194 lb 10.7 oz (88.3 kg) (02/01 0500)   Filed Weights   07/26/17 0200 07/27/17 0200 07/28/17 0500  Weight: 198 lb 13.7 oz (90.2 kg) 194 lb 10.7 oz (88.3 kg) 194 lb 10.7 oz (88.3 kg)    Intake/Output Summary (Last 24 hours) at 07/28/2017 1123 Last data filed at 07/28/2017 1100 Gross per 24 hour  Intake 1911.54 ml  Output 3150 ml  Net -1238.46 ml     PHYSICAL EXAMINATION: General: ill appearing female in NAD, sitting up in bed HEENT: MM pink/moist, Trach midline c/d/i, PMV valve in place with SLP, voice improved Neuro: Awake, alert, follows commands, easily re-directable CV: s1s2 rrr, no m/r/g, midline surgical scar, R shoulder staples  PULM: even/non-labored, lungs bilaterally clear  FT:DDUK, non-tender, bsx4 active  Extremities: warm/dry, no edema  Skin: no rashes or lesions  PULMONARY Recent Labs  Lab 07/22/17 0334  07/23/17 0146  07/23/17 1602 07/24/17 0257  07/24/17 0910   07/25/17 0740 07/26/17 0351 07/26/17 0430 07/26/17 1500 07/27/17 0325 07/27/17 1547 07/28/17 0510  PHART 7.488*  --  7.480*  --   --  7.510*  --  7.520*  --   --   --  7.453*  --   --   --   --   PCO2ART 42.4  --  43.0  --   --  40.6  --  38.6  --   --   --  46.9  --   --   --   --   PO2ART 76.5*  --  75.0*  --   --  59.0*  --  61.0*  --   --   --  75.9*  --   --   --   --   HCO3 31.8*  --  32.1*  --   --  32.4*  --  31.5*  --   --   --  32.2*  --   --   --   --   TCO2  --    < > 33*  --  32 34*  --  33*  --   --   --   --  30  --  36*  --   O2SAT 96.1   < > 96.0   < >  --  93.0   < > 93.0   < > 47.6 57.9 94.8  --  57.6  --  65.1   < > = values  in this interval not displayed.    CBC Recent Labs  Lab 07/26/17 0341  07/27/17 0320 07/27/17 1547 07/28/17 0521  HGB 9.1*   < > 9.1* 10.5* 9.9*  HCT 28.6*   < > 29.0* 31.0* 32.5*  WBC 9.3  --  9.4  --  10.3  PLT 384  --  353  --  434*   < > = values in this interval not displayed.    COAGULATION Recent Labs  Lab 07/24/17 0332 07/25/17 0350  INR 1.14 1.24    CARDIAC  No results for input(s): TROPONINI in the last 168 hours. No results for input(s): PROBNP in the last 168 hours.   CHEMISTRY Recent Labs  Lab 07/21/17 1541  07/24/17 0332  07/25/17 0350  07/26/17 0341 07/26/17 1300 07/26/17 1500 07/26/17 1738 07/27/17 0320 07/27/17 1547 07/28/17 0521  NA  --    < > 139   < > 138   < > 135 137 138 135 137 138 140  K  --    < > 3.1*   < > 3.1*   < > 2.6* 3.1* 3.2* 3.8 3.1* 3.3* 4.1  CL  --    < > 95*  --  98*   < > 96* 97* 94* 96* 95* 92* 96*  CO2  --    < > 29  --  28   < > 27 26  --  28 30  --  31  GLUCOSE  --    < > 106*   < > 162*   < > 115* 153* 163* 146* 134* 129* 132*  BUN  --    < > 28*  --  30*   < > 22* 24* 24* 27* 28* 29* 28*  CREATININE  --    < > 0.74  --  0.71   < > 0.62 0.63 0.60 0.66 0.63 0.70 0.63  CALCIUM  --    < > 6.9*  --  6.5*   < > 6.7* 6.7*  --  6.6* 6.7*  --  7.1*  MG 1.8   < > 2.2  --  1.8   --  1.9  --   --   --  2.1  --  1.8  PHOS 3.4  --   --   --  3.2  --  3.1  --   --   --  3.3  --  2.0*   < > = values in this interval not displayed.   Estimated Creatinine Clearance: 66.7 mL/min (by C-G formula based on SCr of 0.63 mg/dL).   LIVER Recent Labs  Lab 07/24/17 0332 07/25/17 0350 07/26/17 0341 07/27/17 0320 07/28/17 0521  AST 57* 48* 50* 66* 70*  ALT 39 33 33 39 45  ALKPHOS 222* 194* 165* 171* 182*  BILITOT 1.2 1.5* 1.3* 1.1 1.5*  PROT 5.8* 5.6* 5.1* 5.5* 5.8*  ALBUMIN 2.9* 3.0* 2.8* 2.8* 2.9*  INR 1.14 1.24  --   --   --     INFECTIOUS Recent Labs  Lab 07/24/17 2300 07/26/17 0341 07/27/17 0320  LATICACIDVEN 0.7  --   --   PROCALCITON  --  0.52 1.14    ENDOCRINE CBG (last 3)  Recent Labs    07/27/17 2000 07/27/17 2334 07/28/17 0435  GLUCAP 120* 117* 148*     IMAGING  Dg Chest Port 1 View  Result Date: 07/28/2017 CLINICAL DATA:  Status post cardiac surgery. Respiratory distress and hypoxia. Subsequent encounter.  EXAM: PORTABLE CHEST 1 VIEW COMPARISON:  07/26/2017 FINDINGS: There is persistent opacity at both lung bases, without significant change allowing for differences in patient positioning and lung volumes. This is likely combination of small effusions and atelectasis. Lungs demonstrate prominent bronchovascular markings bilaterally, also stable. No overt pulmonary edema. No pneumothorax. No mediastinal widening. Changes from cardiac surgery and valve replacement are stable. Tracheostomy tube, left subclavian central venous line, right PICC and enteric tube are stable. IMPRESSION: 1. No significant change when compared to the most recent prior exam. 2. No overt pulmonary edema, pneumothorax or mediastinal widening. 3. Persistent basilar opacity most likely a combination of pleural effusions and atelectasis. Electronically Signed   By: Lajean Manes M.D.   On: 07/28/2017 08:11   Dg Abd Portable 1v  Result Date: 07/26/2017 CLINICAL DATA:  Feeding tube  placement. EXAM: PORTABLE ABDOMEN - 1 VIEW COMPARISON:  07/25/2017 FINDINGS: The feeding tube tip is in the fourth portion of the duodenum likely at the duodenal jejunal junction. IMPRESSION: Feeding tube tip in good position in the fourth portion of the duodenum. Electronically Signed   By: Marijo Sanes M.D.   On: 07/26/2017 12:42   STUDIES: Echo12/12/2016 LVEF 55-60%, Grade 1 DD, Mild/Mod AI with LVOT, Mid ascending aortic diameter 48.88 mm, Mild LAE, RV normal Echo (07/22/17): EF 65-70%, bioprosthetic aortic valve functioning normally, PASP 33 mmHg, RV looks ok.   CULTURES 1/24 sputum >> oral flora  ANTIBIOTICS Ceftaz1/17 >> 1.20 Cefuroxime 1/21 >> 1/23 Ceftaz 1/23 >> 1/31  EVENTS 1/14  Admit 1/14 S/p AVR and ascending AA replacement  1/14 VF Arrest in evening with shock x 1 1/15 to OR for evacuation of hematoma and RVAD placement  1/16 PCCM consult 1/19 Lasix per cardiology,  CVP of 5 1/21 RVAD removed 1/22 heavily diuresed  1/23 ready for extubation. Atrial fib amio started.  1/24 reintubated 1/25  Afebrile 1/29  trach placed 2/01  Tolerating PMV with SLP   ASSESSMENT / PLAN: 77 year old female with PMHx significant for fusiform ascending aortic aneurysm, hypertension, hyperlipidemia, status post AVR and replacement of ascending aortic aneurysm.  On 07/10/2017 postprocedure patient developed acute cardiogenic shock had an episode of V. fib arrest was taken back to the OR on 07/11/2017 for evacuation of hematoma and placement of RVAD.  RVAD now removed, failed extubation 1/24 but continued to wean well. Underwent tracheostomy 1/29.  Course complicated by agitated delirium.   ASSESSMENT / PLAN:  Acute hypoxic respiratory failure - initially in setting of cardiogenic shock, currently mechanism seems to be atelectasis, shock, poor cough mechanics, deconditioning +/- HF Tracheostomy Status - placed 1/29 P: ATC wean as tolerated  PRVC 8 cc as rest mode  Follow intermittent CX  R O2 for sats 90-95% Mobilize Bronvana + Pulmicort   HCAP/VAP  P: Completed abx, monitor off    Cardiogenic shock - s/p removal of RVAD 1/21; sternum now closed Atrial fibrillation Medication related hypotension.  Back on levophed since started on propofol VF arrest, brief, immediately post op - no further incidence. Prolonged qT P: Inotropes per CVTS Post-op care per CVTS  Constipation - no BM since 1/28 P: Senokot QD, hold for diarrhea Dulcolax Mobilize  Acute Encephalopathy Agitated Delirium - husband states she is "never still" at home P: RASS GOAL: 0 She seems the same on / off sedation, consider weaning to off > ? Are we making her delirium worse Fentanyl ceiling at 300 mcg Continue klonopin 1mg  PT Q8  Wean precedex gtt to off, ceiling  at 0.5 Seroquel 100 mg QHS, started 2/1 PRN haldol  Continue effexor PRN ambien for sleep  FAMILY  - Updates: Husband updated am 2/1 on NP rounding.   Noe Gens, NP-C [facility] Pgr: [phone] or if no answer [phone] 07/28/2017, 11:23 AM  Attending Note:  77 year old female s/p cardiac surgery that failed to wean and is now trached.  Patient is very agitated this AM on exam.  I reviewed CXR myself, trach is in good position.  Discussed with PCCM-NP.  Will increase clonazepam as ordered.  Attempt to get off versed.  Continue fentanyl drip.  Started seroquel and haldol.  Continue home meds.  TC as tolerated and vent at night.  PCCM will continue to follow.  The patient is critically ill with multiple organ systems failure and requires high complexity decision making for assessment and support, frequent evaluation and titration of therapies, application of advanced monitoring technologies and extensive interpretation of multiple databases.   Critical Care Time devoted to patient care services described in this note is  35  Minutes. This time reflects time of care of this signee Dr Jennet Maduro. This  critical care time does not reflect procedure time, or teaching time or supervisory time of PA/NP/Med student/Med Resident etc but could involve care discussion time.  Rush Farmer, M.D. [facility]monary/Critical Care Medicine. Pager: [phone]. After hours pager: [phone].

## 2017-07-28 NOTE — Progress Notes (Signed)
  Speech Language Pathology Treatment: [name] Speaking valve  Patient Details Name: [name] MRN: [id number] DOB: 1940/11/25 Today's Date: 07/28/2017 Time: 6122-4497 SLP Time Calculation (min) (ACUTE ONLY): 24 min  Assessment / Plan / Recommendation Clinical Impression  SLP facilitated session using PMV with husband present (and sitter). Valve remained on trach hub longer today indicative of adequate transition of air flow upward through vocal cords. Required mild-mod cues today to increase intensity with deeper inhalations needed prior to verbalization. Vitals stable, RR 18, HR 90, SpO2 93%. Pt conversant with therapist, family and NP although somewhat sleepier this session. Pt progressing well with PMV goals. If she continues to improve will consider objective swallow assessment at next session (next week).    HPI HPI: Pt admitted 07/10/17 for AVR and ascending AA replacement. Post procedure had VF arrest. 07/11/17 back to OR to evacuate hematoma and place RVAD due to fluid overload and R systolic HF. RVAD subsequently removed. Pt intubated from 07/10/17-07/19/17. Re-intubated 1/24-1/29; trach placed 1/29. PMH: PVD, afib, vfib, hypothyroidism, anxiety and depression, HTN, HLD, R TKA.      SLP Plan  Continue with current plan of care       Recommendations         Patient may use Passy-Muir Speech Valve: During all therapies with supervision(with staff-full supervision) PMSV Supervision: Full MD: Please consider changing trach tube to : Cuffless         Oral Care Recommendations: Oral care BID Follow up Recommendations: Inpatient Rehab SLP Visit Diagnosis: Aphonia (R49.1) Plan: Continue with current plan of care                       [name] 07/28/2017, 11:55 AM  [name].Ed [employer] [phone]

## 2017-07-28 NOTE — Progress Notes (Signed)
Occupational Therapy Treatment Patient Details Name: [name] MRN: [id number] DOB: 27-Dec-1940 Today's Date: 07/28/2017    History of present illness Pt admitted 07/10/17 for AVR and ascending AA replacement. Post procedure had VF arrest. 07/11/17 back to OR to evacuate hematoma and place RVAD due to fluid overload and R systolic HF. RVAD subsequently removed. Pt intubated from 07/10/17-07/19/17. Re-intubated 1/24-1/29; trach placed 1/29. PMH: PVD, afib, vfib, hypothyroidism, anxiety and depression, HTN, HLD, R TKA.   OT comments  Pt progressing towards OT goals this session. Pt communicating appropriately throughout session (mouthing with trach). Pt is maxA+2 for bed mobility and standing trials as we build up activity tolerance for ADL participation; pt demonstrates some BLE activation, improving with each trial, but still unable to achieve fully upright standing this session for progression to functional transfers. Pt able to tolerate sitting EOB for approx 10 min including standing trials and participated with hand over hand grooming tasks. Pt motivated to participate and following directions; intermittent redirection to task and cues to keep from pulling at lines. Continue to recommend CIR-level therapies pending medical appropriateness. Will follow acutely.   Follow Up Recommendations  CIR    Equipment Recommendations  3 in 1 bedside commode    Recommendations for Other Services      Precautions / Restrictions Precautions Precautions: Fall;Sternal Precaution Comments: reinforced sternal precautions during bed mobility Restrictions Weight Bearing Restrictions: Yes Other Position/Activity Restrictions: sternal precautions       Mobility Bed Mobility Overal bed mobility: Needs Assistance Bed Mobility: Rolling;Sidelying to Sit;Sit to Sidelying Rolling: Max assist;+2 for physical assistance Sidelying to sit: Max assist;+2 for physical assistance;HOB elevated     Sit to sidelying:  Max assist;+2 for physical assistance General bed mobility comments: reinforced sternal precautions during bed mobility focusing on rolling, hips to EOB, and using legs as counter weight  Transfers Overall transfer level: Needs assistance Equipment used: 2 person hand held assist Transfers: Sit to/from Stand Sit to Stand: Max assist;+2 physical assistance         General transfer comment: Pt performed partial sit<>stand 3x with BUE support and maxA+2; increased LE quad and glut activation with each trial, but pt unable to achieve fully upright standing. Sitting rest break in between trials. Pt motivated to participate and following commands appropriately, but easily fatigued ready to lay back down    Balance Overall balance assessment: Needs assistance Sitting-balance support: Feet supported;Bilateral upper extremity supported Sitting balance-Leahy Scale: Fair Sitting balance - Comments: Pt able to sit with intermittent min guard, reliant on BUE support. With fatigue, requires modA for posterior support Postural control: Posterior lean   Standing balance-Leahy Scale: Zero                             ADL either performed or assessed with clinical judgement   ADL Overall ADL's : Needs assistance/impaired Eating/Feeding: NPO   Grooming: Wash/dry face;Maximal assistance;Sitting Grooming Details (indicate cue type and reason): Hand over hand                               General ADL Comments: Pt attempting to communicate by mouthing words and guestures - but continues to be total A for ADL largely due to cognition and line management     Vision   Additional Comments: not formally assessed, Pt tracks appropriately   Perception     Praxis  Cognition Arousal/Alertness: Awake/alert Behavior During Therapy: Restless;Impulsive Overall Cognitive Status: Difficult to assess Area of Impairment: Attention;Following commands;Safety/judgement                    Current Attention Level: Sustained   Following Commands: Follows one step commands with increased time;Follows one step commands consistently Safety/Judgement: Decreased awareness of safety     General Comments: Overall, interacting appropriately; very clearly aggravated by amount of lines and trach/vent        Exercises     Shoulder Instructions       General Comments VSS throughout session    Pertinent Vitals/ Pain       Pain Assessment: Faces Faces Pain Scale: Hurts little more Pain Location: Discomfort at trach site Pain Descriptors / Indicators: Grimacing;Guarding Pain Intervention(s): Monitored during session  Home Living                                          Prior Functioning/Environment              Frequency  Min 2X/week        Progress Toward Goals  OT Goals(current goals can now be found in the care plan section)  Progress towards OT goals: Progressing toward goals  Acute Rehab OT Goals Patient Stated Goal: Trach out OT Goal Formulation: With patient Time For Goal Achievement: 08/02/17 Potential to Achieve Goals: Good  Plan Discharge plan remains appropriate;Frequency remains appropriate    Co-evaluation    PT/OT/SLP Co-Evaluation/Treatment: Yes Reason for Co-Treatment: Complexity of the patient's impairments (multi-system involvement);To address functional/ADL transfers PT goals addressed during session: Mobility/safety with mobility;Balance OT goals addressed during session: ADL's and self-care;Strengthening/ROM      AM-PAC PT "6 Clicks" Daily Activity     Outcome Measure   Help from another person eating meals?: Total Help from another person taking care of personal grooming?: A Lot Help from another person toileting, which includes using toliet, bedpan, or urinal?: Total Help from another person bathing (including washing, rinsing, drying)?: Total Help from another person to put on and taking off  regular upper body clothing?: Total Help from another person to put on and taking off regular lower body clothing?: Total 6 Click Score: 7    End of Session Equipment Utilized During Treatment: Oxygen  OT Visit Diagnosis: Unsteadiness on feet (R26.81);Pain;Muscle weakness (generalized) (M62.81)   Activity Tolerance Patient tolerated treatment well(Pt smiled SO BIG and very motivated to work with therapy)   Patient Left in bed;with call bell/phone within reach;with nursing/sitter in room;with restraints reapplied   Nurse Communication Mobility status        Time: 7062-3762 OT Time Calculation (min): 26 min  Charges: OT General Charges $OT Visit: 1 Visit OT Treatments $Therapeutic Activity: 8-22 mins  Hulda Humphrey OTR/L [place] 07/28/2017, 9:39 AM

## 2017-07-28 NOTE — Progress Notes (Signed)
Pt K+ 2.8  Dr. Cyndia Bent aware. 5 total runs of K+ ordered with a repeat K+ to follow runs.

## 2017-07-28 NOTE — Progress Notes (Signed)
11 Days Post-Op Procedure(s) (LRB): REMOVAL OF RVAD WITH PUMP STANDBY (N/A) TRANSESOPHAGEAL ECHOCARDIOGRAM (TEE) (N/A) Subjective: Severe postop delirium has delayed her recovery from resection of large ascending aneurysm and AVR with postop 6 days of RVAD support. No focal neurologic deficit. Patient responsive but just not appropriate and agitated requiring both IV Precedex and fentanyl as well as restraints. Patient is on her preoperative doses of effexor and Seroquel and Ambien. Will add iv prn Haldol. Continue with trach collar's and mobilize out of bed to chair as tolerated. Continue tube feeds. Chest x-ray looks clear. Incisions look lean and dry. She is in sinus rhythm off pressors Objective: Vital signs in last 24 hours: Temp:  [97.9 F (36.6 C)-99 F (37.2 C)] 98.6 F (37 C) (02/01 0400) Pulse Rate:  [82-109] 93 (02/01 0700) Cardiac Rhythm: Normal sinus rhythm (02/01 0400) Resp:  [13-26] 20 (02/01 0700) BP: (91-180)/(58-136) 127/72 (02/01 0700) SpO2:  [90 %-100 %] 97 % (02/01 0700) FiO2 (%):  [40 %-60 %] 60 % (02/01 0740) Weight:  [194 lb 10.7 oz (88.3 kg)] 194 lb 10.7 oz (88.3 kg) (02/01 0500)  Hemodynamic parameters for last 24 hours: CVP:  [11 mmHg-14 mmHg] 13 mmHg  Intake/Output from previous day: 01/31 0701 - 02/01 0700 In: 2063 [I.V.:613; NG/GT:1300; IV Piggyback:150] Out: 2585 [Urine:3275] Intake/Output this shift: No intake/output data recorded.       Exam    General- alert and agitated with wrist restraints    Neck- no JVD, no cervical adenopathy palpable, no carotid bruit   Lungs- clear without rales, wheezes   Cor- regular rate and rhythm, no murmur , gallop   Abdomen- soft, non-tender   Extremities - warm, non-tender, minimal edema   Neuro- oriented, appropriate, no focal weakness   Lab Results: Recent Labs    07/27/17 0320 07/27/17 1547 07/28/17 0521  WBC 9.4  --  10.3  HGB 9.1* 10.5* 9.9*  HCT 29.0* 31.0* 32.5*  PLT 353  --  434*   BMET:   Recent Labs    07/27/17 0320 07/27/17 1547 07/28/17 0521  NA 137 138 140  K 3.1* 3.3* 4.1  CL 95* 92* 96*  CO2 30  --  31  GLUCOSE 134* 129* 132*  BUN 28* 29* 28*  CREATININE 0.63 0.70 0.63  CALCIUM 6.7*  --  7.1*    PT/INR: No results for input(s): LABPROT, INR in the last 72 hours. ABG    Component Value Date/Time   PHART 7.453 (H) 07/26/2017 0430   HCO3 32.2 (H) 07/26/2017 0430   TCO2 36 (H) 07/27/2017 1547   ACIDBASEDEF 1.0 07/18/2017 0404   O2SAT 65.1 07/28/2017 0510   CBG (last 3)  Recent Labs    07/27/17 2000 07/27/17 2334 07/28/17 0435  GLUCAP 120* 117* 148*    Assessment/Plan: S/P Procedure(s) (LRB): REMOVAL OF RVAD WITH PUMP STANDBY (N/A) TRANSESOPHAGEAL ECHOCARDIOGRAM (TEE) (N/A) Continue support for her delirium and try to wean Precedex and IV fentanyl as tolerated   LOS: 18 days    [name] 07/28/2017

## 2017-07-28 NOTE — Progress Notes (Signed)
Patient ID: [name], female   DOB: 14-May-1941, 77 y.o.   MRN: [id number]     Advanced Heart Failure Rounding Note  Primary Cardiologist: Dr. Percival Spanish (2014) HF: (New) Dr. Haroldine Laws   Subjective:    Events - 07/10/17 S/p AVR and ascending AA replacement  - 07/10/17 VF Arrest in evening with shock x 1 - 07/11/17 RVAD placement - 07/17/2017 RVAD removed with chest closed -1/23 Extubated. Went in A fib. Amio started.  -1/24 Reintubated.  -1/29 Trach  Coox 65.1% milrinone 0.25 mcg/kg/min. Off Norepi yesterday.   On Trach collar this am, was on vent overnight. Attempting wean. Agitation remains primary barrier.  In soft restraints and on 400 of fentanyl gtt this am. Follows commands.   Weight stable. Creatinine stable and K much improved. CVP 12-13.   Echo 06/02/2017 LVEF 55-60%, Grade 1 DD, Mild/Mod AI with LVOT, Mid ascending aortic diameter 48.88 mm, Mild LAE, RV normal  Echo (07/22/17): EF 65-70%, bioprosthetic aortic valve functioning normally, PASP 33 mmHg, RV looks ok.   Objective:   Weight Range: 194 lb 10.7 oz (88.3 kg) Body mass index is 32.39 kg/m.   Vital Signs:   Temp:  [97.9 F (36.6 C)-99 F (37.2 C)] 98.6 F (37 C) (02/01 0400) Pulse Rate:  [82-109] 93 (02/01 0700) Resp:  [13-26] 20 (02/01 0700) BP: (91-180)/(58-136) 127/72 (02/01 0700) SpO2:  [90 %-100 %] 97 % (02/01 0700) FiO2 (%):  [40 %-60 %] 60 % (02/01 0620) Weight:  [194 lb 10.7 oz (88.3 kg)] 194 lb 10.7 oz (88.3 kg) (02/01 0500) Last BM Date: 07/27/17  Weight change: Filed Weights   07/26/17 0200 07/27/17 0200 07/28/17 0500  Weight: 198 lb 13.7 oz (90.2 kg) 194 lb 10.7 oz (88.3 kg) 194 lb 10.7 oz (88.3 kg)    Intake/Output:   Intake/Output Summary (Last 24 hours) at 07/28/2017 0721 Last data filed at 07/28/2017 0700 Gross per 24 hour  Intake 2063.02 ml  Output 3275 ml  Net -1211.98 ml    Physical Exam  CVP 12-13  General: Trached. Remains agitated. HEENT: Normal Neck: Supple. JVP  difficult. CVP as above. Carotids 2+ bilat; no bruits. No thyromegaly or nodule noted. Cor: PMI nondisplaced. RRR, No M/G/R noted Lungs: CTAB, normal effort. Abdomen: Soft, non-tender, non-distended, no HSM. No bruits or masses. +BS  Extremities: No cyanosis, clubbing, or rash. Trace BLE edema.  Neuro: Trached. Agitated at times.  Telemetry   NSR 80s, personally reviewed.   EKG    No new tracings.  Labs    CBC Recent Labs    07/27/17 0320 07/27/17 1547 07/28/17 0521  WBC 9.4  --  10.3  HGB 9.1* 10.5* 9.9*  HCT 29.0* 31.0* 32.5*  MCV 97.0  --  97.9  PLT 353  --  373*   Basic Metabolic Panel Recent Labs    07/27/17 0320 07/27/17 1547 07/28/17 0521  NA 137 138 140  K 3.1* 3.3* 4.1  CL 95* 92* 96*  CO2 30  --  31  GLUCOSE 134* 129* 132*  BUN 28* 29* 28*  CREATININE 0.63 0.70 0.63  CALCIUM 6.7*  --  7.1*  MG 2.1  --  1.8  PHOS 3.3  --  2.0*   Liver Function Tests Recent Labs    07/27/17 0320 07/28/17 0521  AST 66* 70*  ALT 39 45  ALKPHOS 171* 182*  BILITOT 1.1 1.5*  PROT 5.5* 5.8*  ALBUMIN 2.8* 2.9*   No results for input(s): LIPASE, AMYLASE in  the last 72 hours. Cardiac Enzymes No results for input(s): CKTOTAL, CKMB, CKMBINDEX, TROPONINI in the last 72 hours.  BNP: BNP (last 3 results) No results for input(s): BNP in the last 8760 hours.  ProBNP (last 3 results) No results for input(s): PROBNP in the last 8760 hours.   D-Dimer No results for input(s): DDIMER in the last 72 hours. Hemoglobin A1C No results for input(s): HGBA1C in the last 72 hours. Fasting Lipid Panel No results for input(s): CHOL, HDL, LDLCALC, TRIG, CHOLHDL, LDLDIRECT in the last 72 hours. Thyroid Function Tests No results for input(s): TSH, T4TOTAL, T3FREE, THYROIDAB in the last 72 hours.  Invalid input(s): FREET3  Other results:   Imaging    No results found.  Medications:    Scheduled Medications: . acetaminophen (TYLENOL) oral liquid 160 mg/5 mL  650 mg Per  Tube Once   Or  . acetaminophen  650 mg Rectal Once  . amiodarone  200 mg Oral BID  . arformoterol  15 mcg Nebulization BID  . aspirin EC  325 mg Oral Daily   Or  . aspirin  324 mg Per Tube Daily  . bisacodyl  10 mg Oral Daily   Or  . bisacodyl  10 mg Rectal Daily  . budesonide (PULMICORT) nebulizer solution  0.5 mg Nebulization BID  . chlorhexidine gluconate (MEDLINE KIT)  15 mL Mouth Rinse BID  . Chlorhexidine Gluconate Cloth  6 each Topical Daily  . clonazepam  1 mg Oral Q8H  . enoxaparin (LOVENOX) injection  40 mg Subcutaneous Q24H  . feeding supplement (PRO-STAT SUGAR FREE 64)  30 mL Per Tube TID  . furosemide  80 mg Intravenous BID  . Gerhardt's butt cream   Topical BID  . insulin aspart  0-24 Units Subcutaneous Q4H  . insulin detemir  10 Units Subcutaneous BID  . latanoprost  1 drop Both Eyes QHS  . levalbuterol  1.25 mg Nebulization Q6H  . levothyroxine  100 mcg Oral QAC breakfast  . mouth rinse  15 mL Mouth Rinse QID  . naphazoline-glycerin  1-2 drop Both Eyes BID  . pantoprazole (PROTONIX) IV  40 mg Intravenous Q12H  . QUEtiapine  100 mg Oral QHS  . senna-docusate  2 tablet Oral Daily  . sodium chloride flush  10-40 mL Intracatheter Q12H  . sodium chloride flush  10-40 mL Intracatheter Q12H  . sodium chloride flush  3 mL Intravenous Q12H  . spironolactone  25 mg Oral Daily  . venlafaxine XR  75 mg Oral Daily  . zolpidem  5 mg Oral QHS    Infusions: . sodium chloride 250 mL (07/22/17 1820)  . dexmedetomidine (PRECEDEX) IV infusion 0.5 mcg/kg/hr (07/28/17 0145)  . feeding supplement (VITAL 1.5 CAL) 1,000 mL (07/27/17 1900)  . fentaNYL infusion INTRAVENOUS 400 mcg/hr (07/28/17 0530)  . lactated ringers Stopped (07/17/17 1200)  . milrinone 0.25 mcg/kg/min (07/27/17 1900)  . norepinephrine (LEVOPHED) Adult infusion Stopped (07/27/17 0900)    PRN Medications: haloperidol lactate, metoprolol tartrate, midazolam, ondansetron (ZOFRAN) IV, sodium chloride  flush  Patient Profile   LOA IDLER is a 77 y.o. female with history of fusiform ascending aortic aneurysm, HTN, and HLD.   Admitted 07/10/17 for planned AVR and replacement of ascending aortic aneurysm. Developed acute cardiogenic shock + VF arrest. Taken back to OR am of 07/11/17 for evacuation of hematoma and placement of RVAD.   Assessment/Plan   1. Cardiogenic shock s/p cardiac surgery with R>L CHF:  Pre-op Echo 06/02/2017 LVEF 55-60%, Grade  1 DD, Mild/Mod AI with LVOT, Mid ascending aortic diameter 48.88 mm, Mild LAE, RV normal.  Post-op shock, RVAD placed now s/p RVAD removal 07/17/2017.  Echo 1/26 with EF 65-70%, bioprosthetic aortic valve, improved RV function.  - Coox 65.1% this am on milrinone 0.25. Off NE.  Will cut milrinone back to 0.125 and follow.  - CVP 12-13. Continue 80 mg IV lasix BID.  - RV function looked better on echo 07/22/16.  2. VF arrest:  - Stable. No further. Now to po amio.  - Goal K > 4.0 and Mg > 2.0 - Mag 1.8 this am. Will supp.  - K improved to 4.1.  3. Post Op Atrial fibrillation:  - Remains in NSR on po amio. - Only short run post op. So no AC for now.  - Currently only getting lovenox --->DVT prophylaxis. 4. Acute respiratory failure: Reintubated 1/24.   - S/P Trach 1/29.  - On 50% 02 today. PCCM to manage agitation and then re-try wean.  - Continues on ceftazidime per CCM.  5. S/p AVR and Ascending aortic aneurysm replacement. - Post-op echo stable. 6. AKI:  - Stable. 7. Anemia:  - Hgb 9.1. Transfuse < 8.0. Stable.  - s/p 1uPRBCs 07/23/17. 8. Hypokalemia - K 4.1 this am. Much improved.  - Continue to follow.  9. Agitation  - PCCM adjusting meds.  - QTc prolongation 534 on EKG 1/22, and > 600 on tele this am.   Length of Stay: [place], [place]  7:21 AM Advanced Heart Failure Team Pager [phone] (M-F; 7a - 4p)  Please contact [facility] for night-coverage after hours (4p -7a ) and weekends on [email or web address]  Agree  with above.  Remains on vent through trach.  Communicative but agitated at times. CVP up + trach Cor RRR Lungs mild rhochi Ab soft NT Ext 2+ edema  Remains on vent (through trach). FiO2 50%. Communicative but still agitated at times. Now off NE. Still on milrinone at 0.25. Co-ox 65%. CVP 12-13. Weight coming down slowly but still overloaded. Will cut milrinone to 0.125. Continue IV diuresis. Supp mag.   Continue PT. Will need CIR.   CRITICAL CARE Performed by: Glori Bickers  Total critical care time: 35 minutes  Critical care time was exclusive of separately billable procedures and treating other patients.  Critical care was necessary to treat or prevent imminent or life-threatening deterioration.  Critical care was time spent personally by me (independent of midlevel providers or residents) on the following activities: development of treatment plan with patient and/or surrogate as well as nursing, discussions with consultants, evaluation of patient's response to treatment, examination of patient, obtaining history from patient or surrogate, ordering and performing treatments and interventions, ordering and review of laboratory studies, ordering and review of radiographic studies, pulse oximetry and re-evaluation of patient's condition.  Glori Bickers, MD  5:58 PM

## 2017-07-28 NOTE — Progress Notes (Signed)
Inpatient Rehabilitation  Continuing to follow for timing of medical readiness and post acute therapy needs.  Note that patient has tolerated trach collar during the day well and attempting night weaning tonight.  Patient resting, so I did not wake and no family at the bedside; however, booklets left on bedside table.  Will follow up Monday.  Call if questions.    Carmelia Roller., CCC/SLP Admission Coordinator  [facility]  Cell [phone]

## 2017-07-28 NOTE — Progress Notes (Signed)
cortrak clogged. Notified cortrak team of this issue

## 2017-07-28 NOTE — Progress Notes (Signed)
Patient ID: [name], female   DOB: Mar 24, 1941, 77 y.o.   MRN: [id number] TCTS Evening Rounds:  Hemodynamically stable on milrinone 0.25  Remains on 60% trach collar  Requiring Precedex and fentanyl drips for delirium.   Urine output good.  BMET    Component Value Date/Time   NA 141 07/28/2017 1719   NA 143 03/15/2017 0920   K 2.8 (L) 07/28/2017 1719   CL 92 (L) 07/28/2017 1719   CO2 31 07/28/2017 0521   GLUCOSE 132 (H) 07/28/2017 1719   BUN 31 (H) 07/28/2017 1719   BUN 12 03/15/2017 0920   CREATININE 0.60 07/28/2017 1719   CREATININE 0.65 10/25/2012 0826   CALCIUM 7.1 (L) 07/28/2017 0521   GFRNONAA >60 07/28/2017 0521   GFRNONAA >89 10/25/2012 0826   GFRAA >60 07/28/2017 0521   GFRAA >89 10/25/2012 0826   Replacing K+

## 2017-07-28 NOTE — Progress Notes (Signed)
Pt extremely agitated...  Patient is disoriented saying "I want to go home."  Pt placed back on vent, restrained, and precedex increased to 0.5 mcg per CCM.  Sitter at bedside. Pt unable to be redirected.

## 2017-07-28 NOTE — Progress Notes (Signed)
RT placed pt on 60% ATC and pt tolerating and resting well. SpO2 96%, HR-80, 107/66, and 23RR. RT to cont to monitor and vent at bedside if pt status declines.

## 2017-07-28 NOTE — Progress Notes (Signed)
Pt seen by trach team for consult. No education needed at this time. All necessary equipment available at bedside. Will continue to monitor for progress. 

## 2017-07-28 NOTE — Plan of Care (Signed)
  Progressing Clinical Measurements: Ability to maintain clinical measurements within normal limits will improve 07/28/2017 2256 - Progressing by Cherlyn Cushing, RN Will remain free from infection 07/28/2017 2256 - Progressing by Cherlyn Cushing, RN Diagnostic test results will improve 07/28/2017 2256 - Progressing by Cherlyn Cushing, RN Respiratory complications will improve 07/28/2017 2256 - Progressing by Cherlyn Cushing, RN Cardiovascular complication will be avoided 07/28/2017 2256 - Progressing by Cherlyn Cushing, RN Coping: Level of anxiety will decrease 07/28/2017 2256 - Progressing by Cherlyn Cushing, RN Elimination: Will not experience complications related to urinary retention 07/28/2017 2256 - Progressing by Cherlyn Cushing, RN Pain Managment: General experience of comfort will improve 07/28/2017 2256 - Progressing by Cherlyn Cushing, RN Clinical Measurements: Postoperative complications will be avoided or minimized 07/28/2017 2256 - Progressing by Cherlyn Cushing, RN Respiratory: Respiratory status will improve 07/28/2017 2256 - Progressing by Cherlyn Cushing, RN

## 2017-07-28 NOTE — Progress Notes (Signed)
Cortrak Tube Team Note:  Consult received due to occluded feeding tube.   Inserted sylet. Met early resistance, roughly around nasal bridle clip. Undid nasal bridle to relieve some pressure on this area of the tube. With some force, was able to insert stylet and seemingly dislodged occlusion. Power flushed.   RN was successfully able to flush meds after lopez Valve switched out. Tube replacement not necessary.   If tube becomes occluded again, will replace. Cortrak team available MWF and Saturday.   If the tube becomes dislodged please keep the tube and contact the Cortrak team at [email or web address] ([email or web address]) for replacement.   If after hours and replacement cannot be delayed, place a NG tube and confirm placement with an abdominal x-ray.   [name] RD, LDN, CNSC Clinical Nutrition Pager: [phone] 07/28/2017 12:10 PM

## 2017-07-29 ENCOUNTER — Inpatient Hospital Stay (HOSPITAL_COMMUNITY): Payer: Medicare Other

## 2017-07-29 LAB — COMPREHENSIVE METABOLIC PANEL
ALT: 61 U/L — ABNORMAL HIGH (ref 14–54)
AST: 104 U/L — ABNORMAL HIGH (ref 15–41)
Albumin: 2.9 g/dL — ABNORMAL LOW (ref 3.5–5.0)
Alkaline Phosphatase: 178 U/L — ABNORMAL HIGH (ref 38–126)
Anion gap: 13 (ref 5–15)
BUN: 32 mg/dL — ABNORMAL HIGH (ref 6–20)
CO2: 33 mmol/L — ABNORMAL HIGH (ref 22–32)
Calcium: 7.4 mg/dL — ABNORMAL LOW (ref 8.9–10.3)
Chloride: 95 mmol/L — ABNORMAL LOW (ref 101–111)
Creatinine, Ser: 0.59 mg/dL (ref 0.44–1.00)
GFR calc Af Amer: >60 mL/min (ref >60)
GFR calc non Af Amer: >60 mL/min (ref >60)
Glucose, Bld: 83 mg/dL (ref 65–99)
Potassium: 3.2 mmol/L — ABNORMAL LOW (ref 3.5–5.1)
Sodium: 141 mmol/L (ref 135–145)
Total Bilirubin: 1.2 mg/dL (ref 0.3–1.2)
Total Protein: 5.9 g/dL — ABNORMAL LOW (ref 6.5–8.1)

## 2017-07-29 LAB — BASIC METABOLIC PANEL
ANION GAP: 13 (ref 5–15)
BUN: 34 mg/dL — AB (ref 6–20)
CALCIUM: 7.3 mg/dL — AB (ref 8.9–10.3)
CO2: 33 mmol/L — AB (ref 22–32)
CREATININE: 0.59 mg/dL (ref 0.44–1.00)
Chloride: 94 mmol/L — ABNORMAL LOW (ref 101–111)
GFR calc Af Amer: >60 mL/min (ref >60)
GLUCOSE: 137 mg/dL — AB (ref 65–99)
Potassium: 3.8 mmol/L (ref 3.5–5.1)
Sodium: 140 mmol/L (ref 135–145)

## 2017-07-29 LAB — GLUCOSE, CAPILLARY
Glucose-Capillary: 111 mg/dL — ABNORMAL HIGH (ref 65–99)
Glucose-Capillary: 124 mg/dL — ABNORMAL HIGH (ref 65–99)
Glucose-Capillary: 133 mg/dL — ABNORMAL HIGH (ref 65–99)
Glucose-Capillary: 67 mg/dL (ref 65–99)
Glucose-Capillary: 73 mg/dL (ref 65–99)
Glucose-Capillary: 73 mg/dL (ref 65–99)
Glucose-Capillary: 79 mg/dL (ref 65–99)
Glucose-Capillary: 79 mg/dL (ref 65–99)
Glucose-Capillary: 84 mg/dL (ref 65–99)

## 2017-07-29 LAB — COOXEMETRY PANEL
Carboxyhemoglobin: 1.7 % — ABNORMAL HIGH (ref 0.5–1.5)
Methemoglobin: 0.8 % (ref 0.0–1.5)
O2 Saturation: 66 %
Total hemoglobin: 10.6 g/dL — ABNORMAL LOW (ref 12.0–16.0)

## 2017-07-29 LAB — POCT I-STAT, CHEM 8
BUN: 27 mg/dL — ABNORMAL HIGH (ref 6–20)
Calcium, Ion: 0.9 mmol/L — ABNORMAL LOW (ref 1.15–1.40)
Chloride: 95 mmol/L — ABNORMAL LOW (ref 101–111)
Creatinine, Ser: 0.5 mg/dL (ref 0.44–1.00)
Glucose, Bld: 120 mg/dL — ABNORMAL HIGH (ref 65–99)
HCT: 39 % (ref 36.0–46.0)
Hemoglobin: 13.3 g/dL (ref 12.0–15.0)
Potassium: 4.1 mmol/L (ref 3.5–5.1)
Sodium: 143 mmol/L (ref 135–145)
TCO2: 38 mmol/L — ABNORMAL HIGH (ref 22–32)

## 2017-07-29 LAB — CBC
HCT: 34.2 % — ABNORMAL LOW (ref 36.0–46.0)
Hemoglobin: 10.3 g/dL — ABNORMAL LOW (ref 12.0–15.0)
MCH: 30 pg (ref 26.0–34.0)
MCHC: 30.1 g/dL (ref 30.0–36.0)
MCV: 99.7 fL (ref 78.0–100.0)
Platelets: 404 10*3/uL — ABNORMAL HIGH (ref 150–400)
RBC: 3.43 MIL/uL — ABNORMAL LOW (ref 3.87–5.11)
RDW: 17.3 % — ABNORMAL HIGH (ref 11.5–15.5)
WBC: 7.8 10*3/uL (ref 4.0–10.5)

## 2017-07-29 MED ORDER — POTASSIUM CHLORIDE 20 MEQ PO PACK
40.0000 meq | PACK | Freq: Once | ORAL | Status: AC
  Administered 2017-07-29: 40 meq
  Filled 2017-07-29: qty 2

## 2017-07-29 MED ORDER — DEXTROSE 50 % IV SOLN
INTRAVENOUS | Status: AC
  Administered 2017-07-29: 50 mL
  Filled 2017-07-29: qty 50

## 2017-07-29 MED ORDER — POTASSIUM CHLORIDE 10 MEQ/50ML IV SOLN
10.0000 meq | INTRAVENOUS | Status: AC
Start: 2017-07-29 — End: 2017-07-29
  Administered 2017-07-29 (×3): 10 meq via INTRAVENOUS
  Filled 2017-07-29 (×3): qty 50

## 2017-07-29 MED ORDER — POTASSIUM CHLORIDE 10 MEQ/50ML IV SOLN
10.0000 meq | INTRAVENOUS | Status: AC
  Administered 2017-07-29 (×3): 10 meq via INTRAVENOUS
  Filled 2017-07-29 (×3): qty 50

## 2017-07-29 NOTE — Progress Notes (Signed)
Patient, still remaining agitated, trying to kick legs over the bed, remove mitts and clothes after PRN Halidol , oxygen saturation decreased. RN called e-link for orders. RT paged as well to place patient back on ventilator.

## 2017-07-29 NOTE — Progress Notes (Signed)
12 Days Post-Op Procedure(s) (LRB): REMOVAL OF RVAD WITH PUMP STANDBY (N/A) TRANSESOPHAGEAL ECHOCARDIOGRAM (TEE) (N/A) Subjective:  Difficulty getting in a comfortable position.  Objective: Vital signs in last 24 hours: Temp:  [97.5 F (36.4 C)-98 F (36.7 C)] 98 F (36.7 C) (02/02 1134) Pulse Rate:  [72-95] 93 (02/02 1300) Cardiac Rhythm: Normal sinus rhythm (02/02 1200) Resp:  [8-29] 28 (02/02 1300) BP: (82-144)/(49-91) 139/83 (02/02 1300) SpO2:  [90 %-98 %] 92 % (02/02 1300) FiO2 (%):  [40 %-60 %] 50 % (02/02 1225) Weight:  [82.1 kg (180 lb 16 oz)] 82.1 kg (180 lb 16 oz) (02/02 0404)  Hemodynamic parameters for last 24 hours: CVP:  [8 mmHg-15 mmHg] 8 mmHg  Intake/Output from previous day: 02/01 0701 - 02/02 0700 In: 1840.1 [I.V.:310.1; NG/GT:1080; IV Piggyback:450] Out: 4575 [Urine:4575] Intake/Output this shift: Total I/O In: 123.1 [I.V.:23.1; NG/GT:100] Out: 1910 [Urine:1910]  General appearance: alert and anxious, restless Neurologic: intact Heart: regular rate and rhythm, S1, S2 normal, no murmur, click, rub or gallop Lungs: clear to auscultation bilaterally Abdomen: soft, non-tender; bowel sounds normal; no masses,  no organomegaly  Lab Results: Recent Labs    07/28/17 0521 07/28/17 1719 07/29/17 0305  WBC 10.3  --  7.8  HGB 9.9* 10.9* 10.3*  HCT 32.5* 32.0* 34.2*  PLT 434*  --  404*   BMET:  Recent Labs    07/29/17 0017 07/29/17 0305  NA 140 141  K 3.8 3.2*  CL 94* 95*  CO2 33* 33*  GLUCOSE 137* 83  BUN 34* 32*  CREATININE 0.59 0.59  CALCIUM 7.3* 7.4*    PT/INR: No results for input(s): LABPROT, INR in the last 72 hours. ABG    Component Value Date/Time   PHART 7.453 (H) 07/26/2017 0430   HCO3 32.2 (H) 07/26/2017 0430   TCO2 37 (H) 07/28/2017 1719   ACIDBASEDEF 1.0 07/18/2017 0404   O2SAT 66.0 07/29/2017 0300   CBG (last 3)  Recent Labs    07/29/17 0535 07/29/17 0628 07/29/17 0802  GLUCAP 84 79 79   CLINICAL DATA:  Status post  AVR.  EXAM: PORTABLE CHEST 1 VIEW  COMPARISON:  07/28/2017  FINDINGS: Postoperative changes in the mediastinum with sternotomy wires and aortic valve prosthesis. Surgical clips in the base of the neck and right axillary region. Skin clips over the right upper chest. An endotracheal tube is in place. An enteric tube is present. Tip is off the field of view but below the left hemidiaphragm. A left central venous catheter is present with tip over the mid mediastinum consistent with location of the brachiocephalic vein. A right PICC line has been placed with tip projected over the cavoatrial junction region.  Shallow inspiration. Cardiac enlargement with mild vascular congestion. Increasing perihilar infiltrates particularly on the right may indicate developing edema. Small bilateral pleural effusions with fluid in the right fissures. No pneumothorax.  IMPRESSION: Appliances appear in satisfactory position. Cardiac enlargement with pulmonary vascular congestion. Increasing perihilar infiltrates may indicate developing edema. Small pleural effusions with fluid in the right fissures.   Electronically Signed   By: Lucienne Capers M.D.   On: 07/29/2017 01:24   Assessment/Plan: S/P Procedure(s) (LRB): REMOVAL OF RVAD WITH PUMP STANDBY (N/A) TRANSESOPHAGEAL ECHOCARDIOGRAM (TEE) (N/A)  She is hemodynamically stable in sinus rhythm. Milrinone stopped today.  Co-ox 66% this am.   Volume excess: weight down to 180 today but preop 168. She is diuresing well. Replacing K+  Remains on trach collar.  Feeding tube replaced today and  back on goal tube feeds.  Anxiety seems to be her main issue now.  Will get OOB to chair.   LOS: 19 days    [name] 07/29/2017

## 2017-07-29 NOTE — Progress Notes (Signed)
..  Name: [name] MRN: [id number] DOB: 03-Mar-1941    ADMISSION DATE:  07/10/2017  CONSULTATION DATE:  07/12/17  REFERRING MD :  Nils Pyle MD   BRIEF:   77 year old female with past medical history significant for fusiform ascending aortic aneurysm, hypertension, hyperlipidemia, status post AVR and replacement of ascending aortic aneurysm.  On 07/10/2017 postprocedure patient developed acute cardiogenic shock had an episode of V. fib arrest was taken back to the OR on 07/11/2017 for evacuation of hematoma and placement of RVAD.  PCCM consulted for ventilator management.    SUBJECTIVE:   Agitation improved, sedating drips weaned to off at 2300 2/1 Tolerated trach collar overnight all night Strong cough this morning   VITAL SIGNS: Temp:  [97.6 F (36.4 C)-98.5 F (36.9 C)] 97.6 F (36.4 C) (02/01 1627) Pulse Rate:  [72-94] 94 (02/02 0732) Resp:  [8-29] 20 (02/02 0732) BP: (82-144)/(49-84) 111/66 (02/02 0700) SpO2:  [90 %-98 %] 93 % (02/02 0732) FiO2 (%):  [40 %-60 %] 40 % (02/02 0732) Weight:  [82.1 kg (180 lb 16 oz)] 82.1 kg (180 lb 16 oz) (02/02 0404)   Filed Weights   07/27/17 0200 07/28/17 0500 07/29/17 0404  Weight: 88.3 kg (194 lb 10.7 oz) 88.3 kg (194 lb 10.7 oz) 82.1 kg (180 lb 16 oz)    Intake/Output Summary (Last 24 hours) at 07/29/2017 0742 Last data filed at 07/29/2017 0865 Gross per 24 hour  Intake 1840.09 ml  Output 4575 ml  Net -2734.91 ml     PHYSICAL EXAMINATION: General: Ill-appearing woman, no distress sitting up on trach collar, frequent cough HEENT: Trach midline, site looks clean Neuro: Awake, alert, answers questions, follows commands CV: Regular, no murmur, midline surgical scar, right shoulder staples clean PULM: Small breaths, coarse but no wheezing GI: Soft, nontender, positive bowel sounds Extremities: No significant edema Skin: No rash  PULMONARY  CBC Recent Labs  Lab 07/27/17 0320  07/28/17 0521 07/28/17 1719 07/29/17 0305    HGB 9.1*   < > 9.9* 10.9* 10.3*  HCT 29.0*   < > 32.5* 32.0* 34.2*  WBC 9.4  --  10.3  --  7.8  PLT 353  --  434*  --  404*   < > = values in this interval not displayed.    COAGULATION Recent Labs  Lab 07/24/17 0332 07/25/17 0350  INR 1.14 1.24    CARDIAC  No results for input(s): TROPONINI in the last 168 hours. No results for input(s): PROBNP in the last 168 hours.   CHEMISTRY Recent Labs  Lab 07/24/17 0332  07/25/17 0350  07/26/17 0341  07/26/17 1738 07/27/17 0320 07/27/17 1547 07/28/17 0521 07/28/17 1719 07/29/17 0017 07/29/17 0305  NA 139   < > 138   < > 135   < > 135 137 138 140 141 140 141  K 3.1*   < > 3.1*   < > 2.6*   < > 3.8 3.1* 3.3* 4.1 2.8* 3.8 3.2*  CL 95*  --  98*   < > 96*   < > 96* 95* 92* 96* 92* 94* 95*  CO2 29  --  28   < > 27   < > 28 30  --  31  --  33* 33*  GLUCOSE 106*   < > 162*   < > 115*   < > 146* 134* 129* 132* 132* 137* 83  BUN 28*  --  30*   < > 22*   < >  27* 28* 29* 28* 31* 34* 32*  CREATININE 0.74  --  0.71   < > 0.62   < > 0.66 0.63 0.70 0.63 0.60 0.59 0.59  CALCIUM 6.9*  --  6.5*   < > 6.7*   < > 6.6* 6.7*  --  7.1*  --  7.3* 7.4*  MG 2.2  --  1.8  --  1.9  --   --  2.1  --  1.8  --   --   --   PHOS  --   --  3.2  --  3.1  --   --  3.3  --  2.0*  --   --   --    < > = values in this interval not displayed.   Estimated Creatinine Clearance: 64.3 mL/min (by C-G formula based on SCr of 0.59 mg/dL).   LIVER Recent Labs  Lab 07/24/17 0332 07/25/17 0350 07/26/17 0341 07/27/17 0320 07/28/17 0521 07/29/17 0305  AST 57* 48* 50* 66* 70* 104*  ALT 39 33 33 39 45 61*  ALKPHOS 222* 194* 165* 171* 182* 178*  BILITOT 1.2 1.5* 1.3* 1.1 1.5* 1.2  PROT 5.8* 5.6* 5.1* 5.5* 5.8* 5.9*  ALBUMIN 2.9* 3.0* 2.8* 2.8* 2.9* 2.9*  INR 1.14 1.24  --   --   --   --     INFECTIOUS Recent Labs  Lab 07/24/17 2300 07/26/17 0341 07/27/17 0320  LATICACIDVEN 0.7  --   --   PROCALCITON  --  0.52 1.14    ENDOCRINE CBG (last 3)  Recent Labs     07/29/17 0415 07/29/17 0535 07/29/17 0628  GLUCAP 124* 84 37     IMAGING  Dg Chest Port 1 View  Result Date: 07/29/2017 CLINICAL DATA:  Status post AVR. EXAM: PORTABLE CHEST 1 VIEW COMPARISON:  07/28/2017 FINDINGS: Postoperative changes in the mediastinum with sternotomy wires and aortic valve prosthesis. Surgical clips in the base of the neck and right axillary region. Skin clips over the right upper chest. An endotracheal tube is in place. An enteric tube is present. Tip is off the field of view but below the left hemidiaphragm. A left central venous catheter is present with tip over the mid mediastinum consistent with location of the brachiocephalic vein. A right PICC line has been placed with tip projected over the cavoatrial junction region. Shallow inspiration. Cardiac enlargement with mild vascular congestion. Increasing perihilar infiltrates particularly on the right may indicate developing edema. Small bilateral pleural effusions with fluid in the right fissures. No pneumothorax. IMPRESSION: Appliances appear in satisfactory position. Cardiac enlargement with pulmonary vascular congestion. Increasing perihilar infiltrates may indicate developing edema. Small pleural effusions with fluid in the right fissures. Electronically Signed   By: Lucienne Capers M.D.   On: 07/29/2017 01:24   Dg Chest Port 1 View  Result Date: 07/28/2017 CLINICAL DATA:  Status post cardiac surgery. Respiratory distress and hypoxia. Subsequent encounter. EXAM: PORTABLE CHEST 1 VIEW COMPARISON:  07/26/2017 FINDINGS: There is persistent opacity at both lung bases, without significant change allowing for differences in patient positioning and lung volumes. This is likely combination of small effusions and atelectasis. Lungs demonstrate prominent bronchovascular markings bilaterally, also stable. No overt pulmonary edema. No pneumothorax. No mediastinal widening. Changes from cardiac surgery and valve replacement are  stable. Tracheostomy tube, left subclavian central venous line, right PICC and enteric tube are stable. IMPRESSION: 1. No significant change when compared to the most recent prior exam. 2. No overt pulmonary edema, pneumothorax  or mediastinal widening. 3. Persistent basilar opacity most likely a combination of pleural effusions and atelectasis. Electronically Signed   By: Lajean Manes M.D.   On: 07/28/2017 08:11   Dg Abd Portable 1v  Result Date: 07/29/2017 CLINICAL DATA:  OG tube placement EXAM: PORTABLE ABDOMEN - 1 VIEW COMPARISON:  07/26/2017 FINDINGS: Enteric tube is coiled in the mid abdomen consistent with location in the body of the stomach. The tube extends towards the region of the distal stomach in then reverses back on itself with tip projected over the upper stomach. IMPRESSION: Enteric tube is projected over the mid abdomen, likely coiled in the stomach. Electronically Signed   By: Lucienne Capers M.D.   On: 07/29/2017 01:22   STUDIES: Echo12/12/2016 LVEF 55-60%, Grade 1 DD, Mild/Mod AI with LVOT, Mid ascending aortic diameter 48.88 mm, Mild LAE, RV normal Echo (07/22/17): EF 65-70%, bioprosthetic aortic valve functioning normally, PASP 33 mmHg, RV looks ok.   CULTURES 1/24 sputum >> oral flora  ANTIBIOTICS Ceftaz1/17 >> 1.20 Cefuroxime 1/21 >> 1/23 Ceftaz 1/23 >> 1/31  EVENTS 1/14  Admit 1/14 S/p AVR and ascending AA replacement  1/14 VF Arrest in evening with shock x 1 1/15 to OR for evacuation of hematoma and RVAD placement  1/16 PCCM consult 1/19 Lasix per cardiology,  CVP of 5 1/21 RVAD removed 1/22 heavily diuresed  1/23 ready for extubation. Atrial fib amio started.  1/24 reintubated 1/25  Afebrile 1/29  trach placed 2/01  Tolerating PMV with SLP   ASSESSMENT / PLAN: 77 year old female with PMHx significant for fusiform ascending aortic aneurysm, hypertension, hyperlipidemia, status post AVR and replacement of ascending aortic aneurysm.  On 07/10/2017  postprocedure patient developed acute cardiogenic shock had an episode of V. fib arrest was taken back to the OR on 07/11/2017 for evacuation of hematoma and placement of RVAD.  RVAD now removed, failed extubation 1/24 but continued to wean well. Underwent tracheostomy 1/29.  Course complicated by agitated delirium > improving  ASSESSMENT / PLAN:  Acute hypoxic respiratory failure - initially in setting of cardiogenic shock, currently mechanism seems to be atelectasis, shock, poor cough mechanics, deconditioning +/- HF Tracheostomy Status - placed 1/29 P: Continue ATC as she can tolerate, 24 x 7 if able.  Some increased atelectasis on chest x-ray 2/2, may still require some intermittent positive pressure Back to [facility] intermittently if needed We will follow chest x-ray Mobilize, continue to work on PT Speech therapy evaluating, speaking valve Brovana, Pulmicort  HCAP/VAP  P:  Following off of antibiotics   Cardiogenic shock - s/p removal of RVAD 1/21; sternum now closed Atrial fibrillation Medication related hypotension.  Back on levophed since started on propofol VF arrest, brief, immediately post op - no further incidence. Prolonged qT P: Remains on amiodarone Remains on milrinone Scheduled spironolactone and Lasix Post-op care per CVTS  Hypokalemia P: Replace electrolyte as indicated  Hypothyroidism P: Synthroid ordered  Hyperglycemia P: Insulin per sliding scale Scheduled Levemir  Constipation P: Senokot QD, hold for diarrhea Dulcolax Mobilize  Acute Encephalopathy Depression Agitated Delirium - husband states she is "never still" at home; improving P: RASS GOAL: 0 Fentanyl to off Precedex to off Continue clonazepam scheduled every 8, attempt to wean as her mental status clears Seroquel 100 nightly started 2/1, she appeared to benefit Scheduled Effexor Haldol as needed Ambien as needed   FAMILY  - Updates: No family present 2/2 morning  Independent  CC time 31 minutes  Baltazar Apo, MD, PhD 07/29/2017, 7:48 AM  [facility] [phone] or if no answer [phone]

## 2017-07-29 NOTE — Progress Notes (Signed)
RN d/c restraints around [place].  Patient has been appropriate.  RN discussed with the patient about her goal of remaining calm and patient verbalized understanding.  Sitter at bedside.  RN will continue to monitor patient closely.

## 2017-07-29 NOTE — Progress Notes (Signed)
RN noted pt cortrak had come out of locked bridle. RN stopped tube feeds, and RN advanced cortrak.  An Xray was ordered for placement confirmation.  Xray states that the tube is "coiled in stomach."  RN will continue to hold TF for now, and notify oncoming RN so Cortrak team can appropriately place Cortrak during the day.

## 2017-07-29 NOTE — Progress Notes (Signed)
Hypoglycemic Event  CBG: 67  Treatment: D50 IV 50 mL  Symptoms: None  Follow-up CBG: Time: 124 CBG Result: 0405  Possible Reasons for Event: Tube Feeding held d/t cortrak being out of place -- around midnight. Levemir was given as normal medication regimen around 2145.  Excpected hypoglycemia.    RN will continue to monitor patient    Cherlyn Cushing

## 2017-07-29 NOTE — Progress Notes (Signed)
Cortrak Tube Team Note:  RD consulted due to occluded feeding tube.   Occlusion was unable to be unclogged with stylet and vigorous flushing. Ultimately had to be removed.   A 10 F Cortrak tube was placed in the L nare and secured with a nasal bridle at 92 cm. Per the Cortrak monitor reading the tube tip is postpyloric, approximately D3.  No x-ray is required. RN may begin using tube.   RD had dislodged occlusion for same tube yesterday. RD noted that patient was receiving large number of crushed meds and microbeads. These are likely facilitating clogging. RN stated she would see if Pharm could switch meds to liquid form.   If the tube becomes dislodged please keep the tube and contact the Cortrak team at [email or web address] ([email or web address]) for replacement.  If after hours and replacement cannot be delayed, place a NG tube and confirm placement with an abdominal x-ray.   [name] RD, LDN, CNSC Clinical Nutrition Pager: [phone] 07/29/2017 12:48 PM

## 2017-07-29 NOTE — Progress Notes (Signed)
Patient ID: [name], female   DOB: September 15, 1940, 77 y.o.   MRN: [id number]     Advanced Heart Failure Rounding Note  Primary Cardiologist: Dr. Percival Spanish (2014) HF: (New) Dr. Haroldine Laws   Subjective:    Events - 07/10/17 S/p AVR and ascending AA replacement  - 07/10/17 VF Arrest in evening with shock x 1 - 07/11/17 RVAD placement - 07/17/2017 RVAD removed with chest closed -1/23 Extubated. Went in A fib. Amio started.  -1/24 Reintubated.  -1/29 Trach  Yesterday milrinone decreased to 0.125. On trach collar. Nutrition attempting to put cor-trak back in. She is frustrated. Denies CP or SOB. + cough. Diuresing well. Weight is way down CVP 9. Co-ox 66%  Echo (07/22/17): EF 65-70%, bioprosthetic aortic valve functioning normally, PASP 33 mmHg, RV looks ok.   Objective:   Weight Range: 82.1 kg (180 lb 16 oz) Body mass index is 30.12 kg/m.   Vital Signs:   Temp:  [97.5 F (36.4 C)-98 F (36.7 C)] 98 F (36.7 C) (02/02 1134) Pulse Rate:  [72-95] 90 (02/02 1200) Resp:  [8-29] 12 (02/02 1200) BP: (82-144)/(49-91) 120/68 (02/02 1200) SpO2:  [90 %-98 %] 94 % (02/02 1200) FiO2 (%):  [40 %-60 %] 40 % (02/02 0800) Weight:  [82.1 kg (180 lb 16 oz)] 82.1 kg (180 lb 16 oz) (02/02 0404) Last BM Date: 07/27/17  Weight change: Filed Weights   07/27/17 0200 07/28/17 0500 07/29/17 0404  Weight: 88.3 kg (194 lb 10.7 oz) 88.3 kg (194 lb 10.7 oz) 82.1 kg (180 lb 16 oz)    Intake/Output:   Intake/Output Summary (Last 24 hours) at 07/29/2017 1220 Last data filed at 07/29/2017 1000 Gross per 24 hour  Intake 1503.74 ml  Output 5250 ml  Net -3746.26 ml    Physical Exam  CVP 9  General:  Sitting up in bed. Communicative. On trach collar  HEENT: normal Neck: + trach collar supple. no JVD. Carotids 2+ bilat; no bruits. No lymphadenopathy or thryomegaly appreciated. Cor: PMI nondisplaced. Regular rate & rhythm. No rubs, gallops or murmurs. Lungs: mild rhonchi Abdomen: soft, nontender,  nondistended. No hepatosplenomegaly. No bruits or masses. Good bowel sounds. Extremities: no cyanosis, clubbing, rash, 1+ edema Neuro: alert & orientedx3, cranial nerves grossly intact. moves all 4 extremities w/o difficulty. Affect pleasant   Telemetry   NSR 80-90s, personally reviewed.   EKG    No new tracings.  Labs    CBC Recent Labs    07/28/17 0521 07/28/17 1719 07/29/17 0305  WBC 10.3  --  7.8  HGB 9.9* 10.9* 10.3*  HCT 32.5* 32.0* 34.2*  MCV 97.9  --  99.7  PLT 434*  --  376*   Basic Metabolic Panel Recent Labs    07/27/17 0320  07/28/17 0521  07/29/17 0017 07/29/17 0305  NA 137   < > 140   < > 140 141  K 3.1*   < > 4.1   < > 3.8 3.2*  CL 95*   < > 96*   < > 94* 95*  CO2 30  --  31  --  33* 33*  GLUCOSE 134*   < > 132*   < > 137* 83  BUN 28*   < > 28*   < > 34* 32*  CREATININE 0.63   < > 0.63   < > 0.59 0.59  CALCIUM 6.7*  --  7.1*  --  7.3* 7.4*  MG 2.1  --  1.8  --   --   --  PHOS 3.3  --  2.0*  --   --   --    < > = values in this interval not displayed.   Liver Function Tests Recent Labs    07/28/17 0521 07/29/17 0305  AST 70* 104*  ALT 45 61*  ALKPHOS 182* 178*  BILITOT 1.5* 1.2  PROT 5.8* 5.9*  ALBUMIN 2.9* 2.9*   No results for input(s): LIPASE, AMYLASE in the last 72 hours. Cardiac Enzymes No results for input(s): CKTOTAL, CKMB, CKMBINDEX, TROPONINI in the last 72 hours.  BNP: BNP (last 3 results) No results for input(s): BNP in the last 8760 hours.  ProBNP (last 3 results) No results for input(s): PROBNP in the last 8760 hours.   D-Dimer No results for input(s): DDIMER in the last 72 hours. Hemoglobin A1C No results for input(s): HGBA1C in the last 72 hours. Fasting Lipid Panel No results for input(s): CHOL, HDL, LDLCALC, TRIG, CHOLHDL, LDLDIRECT in the last 72 hours. Thyroid Function Tests No results for input(s): TSH, T4TOTAL, T3FREE, THYROIDAB in the last 72 hours.  Invalid input(s): FREET3  Other  results:   Imaging    Dg Chest Port 1 View  Result Date: 07/29/2017 CLINICAL DATA:  Status post AVR. EXAM: PORTABLE CHEST 1 VIEW COMPARISON:  07/28/2017 FINDINGS: Postoperative changes in the mediastinum with sternotomy wires and aortic valve prosthesis. Surgical clips in the base of the neck and right axillary region. Skin clips over the right upper chest. An endotracheal tube is in place. An enteric tube is present. Tip is off the field of view but below the left hemidiaphragm. A left central venous catheter is present with tip over the mid mediastinum consistent with location of the brachiocephalic vein. A right PICC line has been placed with tip projected over the cavoatrial junction region. Shallow inspiration. Cardiac enlargement with mild vascular congestion. Increasing perihilar infiltrates particularly on the right may indicate developing edema. Small bilateral pleural effusions with fluid in the right fissures. No pneumothorax. IMPRESSION: Appliances appear in satisfactory position. Cardiac enlargement with pulmonary vascular congestion. Increasing perihilar infiltrates may indicate developing edema. Small pleural effusions with fluid in the right fissures. Electronically Signed   By: Lucienne Capers M.D.   On: 07/29/2017 01:24   Dg Abd Portable 1v  Result Date: 07/29/2017 CLINICAL DATA:  OG tube placement EXAM: PORTABLE ABDOMEN - 1 VIEW COMPARISON:  07/26/2017 FINDINGS: Enteric tube is coiled in the mid abdomen consistent with location in the body of the stomach. The tube extends towards the region of the distal stomach in then reverses back on itself with tip projected over the upper stomach. IMPRESSION: Enteric tube is projected over the mid abdomen, likely coiled in the stomach. Electronically Signed   By: Lucienne Capers M.D.   On: 07/29/2017 01:22    Medications:    Scheduled Medications: . amiodarone  200 mg Oral BID  . arformoterol  15 mcg Nebulization BID  . aspirin EC  325 mg  Oral Daily   Or  . aspirin  324 mg Per Tube Daily  . bisacodyl  10 mg Oral Daily   Or  . bisacodyl  10 mg Rectal Daily  . budesonide (PULMICORT) nebulizer solution  0.5 mg Nebulization BID  . chlorhexidine gluconate (MEDLINE KIT)  15 mL Mouth Rinse BID  . Chlorhexidine Gluconate Cloth  6 each Topical Daily  . clonazepam  1 mg Oral Q8H  . enoxaparin (LOVENOX) injection  40 mg Subcutaneous Q24H  . feeding supplement (PRO-STAT SUGAR FREE 64)  30 mL Per Tube TID  . furosemide  80 mg Intravenous BID  . Gerhardt's butt cream   Topical BID  . insulin aspart  0-24 Units Subcutaneous Q4H  . insulin detemir  10 Units Subcutaneous BID  . latanoprost  1 drop Both Eyes QHS  . levalbuterol  1.25 mg Nebulization Q6H  . levothyroxine  100 mcg Oral QAC breakfast  . mouth rinse  15 mL Mouth Rinse QID  . naphazoline-glycerin  1-2 drop Both Eyes BID  . pantoprazole (PROTONIX) IV  40 mg Intravenous Q12H  . potassium chloride  40 mEq Per Tube Once  . QUEtiapine  100 mg Oral QHS  . senna-docusate  2 tablet Oral Daily  . sodium chloride flush  10-40 mL Intracatheter Q12H  . spironolactone  25 mg Oral Daily  . venlafaxine XR  75 mg Oral Daily  . zolpidem  5 mg Oral QHS    Infusions: . sodium chloride 250 mL (07/22/17 1820)  . feeding supplement (VITAL 1.5 CAL) Stopped (07/29/17 0030)  . fentaNYL infusion INTRAVENOUS Stopped (07/28/17 2240)  . lactated ringers Stopped (07/17/17 1200)  . milrinone 0.125 mcg/kg/min (07/29/17 1000)  . norepinephrine (LEVOPHED) Adult infusion Stopped (07/27/17 0900)    PRN Medications: haloperidol lactate, metoprolol tartrate, ondansetron (ZOFRAN) IV  Patient Profile   [name] is a 77 y.o. female with history of fusiform ascending aortic aneurysm, HTN, and HLD.   Admitted 07/10/17 for planned AVR and replacement of ascending aortic aneurysm. Developed acute cardiogenic shock + VF arrest. Taken back to OR am of 07/11/17 for evacuation of hematoma and placement  of RVAD.   Assessment/Plan   1. Cardiogenic shock s/p cardiac surgery with R>L CHF:  Pre-op Echo 06/02/2017 LVEF 55-60%, Grade 1 DD, Mild/Mod AI with LVOT, Mid ascending aortic diameter 48.88 mm, Mild LAE, RV normal.  Post-op shock, RVAD placed now s/p RVAD removal 07/17/2017.  Echo 1/26 with EF 65-70%, bioprosthetic aortic valve, improved RV function.  - Coox 66% this am on milrinone 0.125. Can stop milrinone - CVP 9. Continue 80 mg IV lasix BID one more day. Likely can switch to po tomorrow.  - RV function looked better on echo 07/22/16.  2. VF arrest:  - Stable. No further. Now to po amio.  - Goal K > 4.0 and Mg > 2.0 - Mag 2.0 - K 3.2 will supp  3. Post Op Atrial fibrillation:  - Remains in NSR on po amio. - Only short run post op. So no AC for now.  - Currently only getting lovenox --->DVT prophylaxis. 4. Acute respiratory failure: Reintubated 1/24.   - S/P Trach 1/29.  - Now weaning with trach collar  - Continues on ceftazidime per CCM.  5. S/p AVR and Ascending aortic aneurysm replacement. - Post-op echo stable. 6. AKI:  - Stable. 7. Anemia:  - Hgb up to 10.3 Transfuse < 8.0. Stable.  - s/p 1uPRBCs 07/23/17. 8. Hypokalemia - K3.2 will supp - Continue to follow.   Progressing well. Continue diuresis. Can stop milrinone.   Length of Stay: 72   Glori Bickers, MD  12:20 PM Advanced Heart Failure Team Pager [phone] (M-F; 7a - 4p)  Please contact [facility] for night-coverage after hours (4p -7a ) and weekends on [email or web address]

## 2017-07-30 ENCOUNTER — Inpatient Hospital Stay (HOSPITAL_COMMUNITY): Payer: Medicare Other

## 2017-07-30 LAB — CBC
HCT: 39.5 % (ref 36.0–46.0)
Hemoglobin: 11.9 g/dL — ABNORMAL LOW (ref 12.0–15.0)
MCH: 30.2 pg (ref 26.0–34.0)
MCHC: 30.1 g/dL (ref 30.0–36.0)
MCV: 100.3 fL — ABNORMAL HIGH (ref 78.0–100.0)
Platelets: 570 10*3/uL — ABNORMAL HIGH (ref 150–400)
RBC: 3.94 MIL/uL (ref 3.87–5.11)
RDW: 17.4 % — ABNORMAL HIGH (ref 11.5–15.5)
WBC: 9.6 10*3/uL (ref 4.0–10.5)

## 2017-07-30 LAB — POCT I-STAT, CHEM 8
BUN: 25 mg/dL — ABNORMAL HIGH (ref 6–20)
BUN: 29 mg/dL — ABNORMAL HIGH (ref 6–20)
Calcium, Ion: 0.87 mmol/L — CL (ref 1.15–1.40)
Calcium, Ion: 1 mmol/L — ABNORMAL LOW (ref 1.15–1.40)
Chloride: 105 mmol/L (ref 101–111)
Chloride: 98 mmol/L — ABNORMAL LOW (ref 101–111)
Creatinine, Ser: 0.4 mg/dL — ABNORMAL LOW (ref 0.44–1.00)
Creatinine, Ser: 0.6 mg/dL (ref 0.44–1.00)
Glucose, Bld: 111 mg/dL — ABNORMAL HIGH (ref 65–99)
Glucose, Bld: 92 mg/dL (ref 65–99)
HCT: 33 % — ABNORMAL LOW (ref 36.0–46.0)
HCT: 40 % (ref 36.0–46.0)
Hemoglobin: 11.2 g/dL — ABNORMAL LOW (ref 12.0–15.0)
Hemoglobin: 13.6 g/dL (ref 12.0–15.0)
Potassium: 2.9 mmol/L — ABNORMAL LOW (ref 3.5–5.1)
Potassium: 3.7 mmol/L (ref 3.5–5.1)
Sodium: 147 mmol/L — ABNORMAL HIGH (ref 135–145)
Sodium: 149 mmol/L — ABNORMAL HIGH (ref 135–145)
TCO2: 30 mmol/L (ref 22–32)
TCO2: 36 mmol/L — ABNORMAL HIGH (ref 22–32)

## 2017-07-30 LAB — BASIC METABOLIC PANEL
Anion gap: 13 (ref 5–15)
BUN: 26 mg/dL — AB (ref 6–20)
CO2: 34 mmol/L — ABNORMAL HIGH (ref 22–32)
CREATININE: 0.6 mg/dL (ref 0.44–1.00)
Calcium: 7.7 mg/dL — ABNORMAL LOW (ref 8.9–10.3)
Chloride: 97 mmol/L — ABNORMAL LOW (ref 101–111)
GFR calc Af Amer: >60 mL/min (ref >60)
Glucose, Bld: 124 mg/dL — ABNORMAL HIGH (ref 65–99)
Potassium: 3.2 mmol/L — ABNORMAL LOW (ref 3.5–5.1)
SODIUM: 144 mmol/L (ref 135–145)

## 2017-07-30 LAB — GLUCOSE, CAPILLARY
Glucose-Capillary: 114 mg/dL — ABNORMAL HIGH (ref 65–99)
Glucose-Capillary: 156 mg/dL — ABNORMAL HIGH (ref 65–99)
Glucose-Capillary: 120 mg/dL — ABNORMAL HIGH (ref 65–99)
Glucose-Capillary: 131 mg/dL — ABNORMAL HIGH (ref 65–99)
Glucose-Capillary: 115 mg/dL — ABNORMAL HIGH (ref 65–99)

## 2017-07-30 LAB — COOXEMETRY PANEL
Carboxyhemoglobin: 1.6 % — ABNORMAL HIGH (ref 0.5–1.5)
Methemoglobin: 0.7 % (ref 0.0–1.5)
O2 Saturation: 71.8 %
Total hemoglobin: 12.3 g/dL (ref 12.0–16.0)

## 2017-07-30 LAB — MAGNESIUM: MAGNESIUM: 2 mg/dL (ref 1.7–2.4)

## 2017-07-30 MED ORDER — POTASSIUM CHLORIDE 20 MEQ PO PACK
40.0000 meq | PACK | Freq: Once | ORAL | Status: AC
  Administered 2017-07-30: 40 meq
  Filled 2017-07-30: qty 2

## 2017-07-30 MED ORDER — POTASSIUM CHLORIDE 10 MEQ/50ML IV SOLN
10.0000 meq | INTRAVENOUS | Status: AC
  Administered 2017-07-30 (×3): 10 meq via INTRAVENOUS
  Filled 2017-07-30 (×3): qty 50

## 2017-07-30 NOTE — Progress Notes (Signed)
13 Days Post-Op Procedure(s) (LRB): REMOVAL OF RVAD WITH PUMP STANDBY (N/A) TRANSESOPHAGEAL ECHOCARDIOGRAM (TEE) (N/A) Subjective: Had to go back on vent overnight. Back on trach collar this am.  Objective: Vital signs in last 24 hours: Temp:  [97.4 F (36.3 C)-98.5 F (36.9 C)] 98.3 F (36.8 C) (02/03 0800) Pulse Rate:  [66-114] 95 (02/03 0825) Cardiac Rhythm: Sinus tachycardia (02/03 0800) Resp:  [12-30] 26 (02/03 0825) BP: (76-157)/(50-88) 114/73 (02/03 0800) SpO2:  [88 %-96 %] 94 % (02/03 0825) FiO2 (%):  [40 %-60 %] 50 % (02/03 0825) Weight:  [85.1 kg (187 lb 9.8 oz)] 85.1 kg (187 lb 9.8 oz) (02/03 0500)  Hemodynamic parameters for last 24 hours: CVP:  [2 mmHg-10 mmHg] 9 mmHg  Intake/Output from previous day: 02/02 0701 - 02/03 0700 In: 753.1 [I.V.:23.1; NG/GT:680; IV Piggyback:50] Out: 9741 [Urine:4375] Intake/Output this shift: Total I/O In: 280 [NG/GT:180; IV Piggyback:100] Out: 190 [Urine:190]  General appearance: alert and restless Neurologic: intact Heart: regular rate and rhythm, S1, S2 normal, no murmur, click, rub or gallop Lungs: few rhonchi Abdomen: soft, non-tender; bowel sounds normal Extremities: extremities normal, atraumatic, no cyanosis or edema Wound: incisions healing well  Lab Results: Recent Labs    07/28/17 0521  07/29/17 0305 07/29/17 1646  WBC 10.3  --  7.8  --   HGB 9.9*   < > 10.3* 13.3  HCT 32.5*   < > 34.2* 39.0  PLT 434*  --  404*  --    < > = values in this interval not displayed.   BMET:  Recent Labs    07/29/17 0305 07/29/17 1646 07/30/17 0142  NA 141 143 144  K 3.2* 4.1 3.2*  CL 95* 95* 97*  CO2 33*  --  34*  GLUCOSE 83 120* 124*  BUN 32* 27* 26*  CREATININE 0.59 0.50 0.60  CALCIUM 7.4*  --  7.7*    PT/INR: No results for input(s): LABPROT, INR in the last 72 hours. ABG    Component Value Date/Time   PHART 7.453 (H) 07/26/2017 0430   HCO3 32.2 (H) 07/26/2017 0430   TCO2 38 (H) 07/29/2017 1646   ACIDBASEDEF  1.0 07/18/2017 0404   O2SAT 71.8 07/30/2017 0140   CBG (last 3)  Recent Labs    07/29/17 1936 07/29/17 2323 07/30/17 0329  GLUCAP 114* 156* 120*    Assessment/Plan:  S/P AVR and ascending aortic replacement S/P Placement of RVAD S/P REMOVAL OF RVAD   She is hemodynamically stable in sinus rhythm. Milrinone stopped yesterday. Co-ox 72 this am. CVP 10  Volume excess: weights inaccurate. -3600 yesterday but recorded wt is up.  Remains on trach collar during the day. Needed to go back on vent last night.   Tolerating goal tube feeds  Anxiety seems to be her main issue.  Will get OOB to chair again today.     LOS: 20 days    Gaye Pollack 07/30/2017

## 2017-07-30 NOTE — Progress Notes (Signed)
..  Name: [name] MRN: [id number] DOB: 08/09/1940    ADMISSION DATE:  07/10/2017  CONSULTATION DATE:  07/12/17  REFERRING MD :  Nils Pyle MD   BRIEF:   77 year old female with past medical history significant for fusiform ascending aortic aneurysm, hypertension, hyperlipidemia, status post AVR and replacement of ascending aortic aneurysm.  On 07/10/2017 postprocedure patient developed acute cardiogenic shock had an episode of V. fib arrest was taken back to the OR on 07/11/2017 for evacuation of hematoma and placement of RVAD.  PCCM consulted for ventilator management.    SUBJECTIVE:   Milrinone was stopped 2/2 Tolerated ATC all day 2/2, back to mechanical ventilation due to desaturations late last night Currently on FiO2 60% Has continued to be somewhat agitated  VITAL SIGNS: Temp:  [97.4 F (36.3 C)-98.5 F (36.9 C)] 98.5 F (36.9 C) (02/02 2325) Pulse Rate:  [66-114] 101 (02/03 0700) Resp:  [12-30] 23 (02/03 0700) BP: (76-157)/(50-91) 87/50 (02/03 0700) SpO2:  [88 %-96 %] 91 % (02/03 0700) FiO2 (%):  [40 %-60 %] 60 % (02/03 0308) Weight:  [85.1 kg (187 lb 9.8 oz)] 85.1 kg (187 lb 9.8 oz) (02/03 0500)   Filed Weights   07/28/17 0500 07/29/17 0404 07/30/17 0500  Weight: 88.3 kg (194 lb 10.7 oz) 82.1 kg (180 lb 16 oz) 85.1 kg (187 lb 9.8 oz)    Intake/Output Summary (Last 24 hours) at 07/30/2017 0724 Last data filed at 07/30/2017 0400 Gross per 24 hour  Intake 753.1 ml  Output 4375 ml  Net -3621.9 ml     PHYSICAL EXAMINATION: General: Ill-appearing woman, no distress, some mild anxiety evident HEENT: Trach midline, site looks clean Neuro: Awake, alert, nods to questions, follows commands CV: Regular, distant, no murmur, midline surgical scar, right shoulder staples PULM: Coarse bilateral breath sounds, no wheezing, few crackles bilaterally GI: Soft, nontender, positive bowel sounds Extremities: No significant lower extremity edema Skin: No  rash  PULMONARY  CBC Recent Labs  Lab 07/27/17 0320  07/28/17 0521 07/28/17 1719 07/29/17 0305 07/29/17 1646  HGB 9.1*   < > 9.9* 10.9* 10.3* 13.3  HCT 29.0*   < > 32.5* 32.0* 34.2* 39.0  WBC 9.4  --  10.3  --  7.8  --   PLT 353  --  434*  --  404*  --    < > = values in this interval not displayed.    COAGULATION Recent Labs  Lab 07/24/17 0332 07/25/17 0350  INR 1.14 1.24    CARDIAC  No results for input(s): TROPONINI in the last 168 hours. No results for input(s): PROBNP in the last 168 hours.   CHEMISTRY Recent Labs  Lab 07/25/17 0350  07/26/17 0341  07/27/17 0320  07/28/17 0521 07/28/17 1719 07/29/17 0017 07/29/17 0305 07/29/17 1646 07/30/17 0142  NA 138   < > 135   < > 137   < > 140 141 140 141 143 144  K 3.1*   < > 2.6*   < > 3.1*   < > 4.1 2.8* 3.8 3.2* 4.1 3.2*  CL 98*   < > 96*   < > 95*   < > 96* 92* 94* 95* 95* 97*  CO2 28   < > 27   < > 30  --  31  --  33* 33*  --  34*  GLUCOSE 162*   < > 115*   < > 134*   < > 132* 132* 137* 83 120* 124*  BUN 30*   < > 22*   < > 28*   < > 28* 31* 34* 32* 27* 26*  CREATININE 0.71   < > 0.62   < > 0.63   < > 0.63 0.60 0.59 0.59 0.50 0.60  CALCIUM 6.5*   < > 6.7*   < > 6.7*  --  7.1*  --  7.3* 7.4*  --  7.7*  MG 1.8  --  1.9  --  2.1  --  1.8  --   --   --   --  2.0  PHOS 3.2  --  3.1  --  3.3  --  2.0*  --   --   --   --   --    < > = values in this interval not displayed.   Estimated Creatinine Clearance: 65.4 mL/min (by C-G formula based on SCr of 0.6 mg/dL).   LIVER Recent Labs  Lab 07/24/17 0332 07/25/17 0350 07/26/17 0341 07/27/17 0320 07/28/17 0521 07/29/17 0305  AST 57* 48* 50* 66* 70* 104*  ALT 39 33 33 39 45 61*  ALKPHOS 222* 194* 165* 171* 182* 178*  BILITOT 1.2 1.5* 1.3* 1.1 1.5* 1.2  PROT 5.8* 5.6* 5.1* 5.5* 5.8* 5.9*  ALBUMIN 2.9* 3.0* 2.8* 2.8* 2.9* 2.9*  INR 1.14 1.24  --   --   --   --     INFECTIOUS Recent Labs  Lab 07/24/17 2300 07/26/17 0341 07/27/17 0320  LATICACIDVEN 0.7   --   --   PROCALCITON  --  0.52 1.14    ENDOCRINE CBG (last 3)  Recent Labs    07/29/17 1936 07/29/17 2323 07/30/17 0329  GLUCAP 114* 156* 120*     IMAGING  Dg Chest Port 1 View  Result Date: 07/29/2017 CLINICAL DATA:  Status post AVR. EXAM: PORTABLE CHEST 1 VIEW COMPARISON:  07/28/2017 FINDINGS: Postoperative changes in the mediastinum with sternotomy wires and aortic valve prosthesis. Surgical clips in the base of the neck and right axillary region. Skin clips over the right upper chest. An endotracheal tube is in place. An enteric tube is present. Tip is off the field of view but below the left hemidiaphragm. A left central venous catheter is present with tip over the mid mediastinum consistent with location of the brachiocephalic vein. A right PICC line has been placed with tip projected over the cavoatrial junction region. Shallow inspiration. Cardiac enlargement with mild vascular congestion. Increasing perihilar infiltrates particularly on the right may indicate developing edema. Small bilateral pleural effusions with fluid in the right fissures. No pneumothorax. IMPRESSION: Appliances appear in satisfactory position. Cardiac enlargement with pulmonary vascular congestion. Increasing perihilar infiltrates may indicate developing edema. Small pleural effusions with fluid in the right fissures. Electronically Signed   By: Lucienne Capers M.D.   On: 07/29/2017 01:24   Dg Abd Portable 1v  Result Date: 07/29/2017 CLINICAL DATA:  OG tube placement EXAM: PORTABLE ABDOMEN - 1 VIEW COMPARISON:  07/26/2017 FINDINGS: Enteric tube is coiled in the mid abdomen consistent with location in the body of the stomach. The tube extends towards the region of the distal stomach in then reverses back on itself with tip projected over the upper stomach. IMPRESSION: Enteric tube is projected over the mid abdomen, likely coiled in the stomach. Electronically Signed   By: Lucienne Capers M.D.   On: 07/29/2017  01:22   STUDIES: Echo12/12/2016 LVEF 55-60%, Grade 1 DD, Mild/Mod AI with LVOT, Mid ascending aortic diameter 48.88 mm,  Mild LAE, RV normal Echo (07/22/17): EF 65-70%, bioprosthetic aortic valve functioning normally, PASP 33 mmHg, RV looks ok.   CULTURES 1/24 sputum >> oral flora  ANTIBIOTICS Ceftaz1/17 >> 1.20 Cefuroxime 1/21 >> 1/23 Ceftaz 1/23 >> 1/31  EVENTS 1/14  Admit 1/14 S/p AVR and ascending AA replacement  1/14 VF Arrest in evening with shock x 1 1/15 to OR for evacuation of hematoma and RVAD placement  1/16 PCCM consult 1/19 Lasix per cardiology,  CVP of 5 1/21 RVAD removed 1/22 heavily diuresed  1/23 ready for extubation. Atrial fib amio started.  1/24 reintubated 1/25  Afebrile 1/29  trach placed 2/01  Tolerating PMV with SLP   ASSESSMENT / PLAN: 77 year old female with PMHx significant for fusiform ascending aortic aneurysm, hypertension, hyperlipidemia, status post AVR and replacement of ascending aortic aneurysm.  On 07/10/2017 postprocedure patient developed acute cardiogenic shock had an episode of V. fib arrest was taken back to the OR on 07/11/2017 for evacuation of hematoma and placement of RVAD.  RVAD now removed, failed extubation 1/24 but continued to wean well. Underwent tracheostomy 1/29.  Course complicated by agitated delirium > improving  ASSESSMENT / PLAN:  Acute hypoxic respiratory failure - initially in setting of cardiogenic shock, currently mechanism seems to be atelectasis, shock, poor cough mechanics, deconditioning +/- HF Tracheostomy Status - placed 1/29 P: Continue ATC as she can tolerate, 24 x 7 if able.  Suspect that she will cycle on and off mechanical ventilation for a period of time.  Some atelectasis on chest x-ray 2/2, repeat today.  Continue volume removal PRVC intermittently if needed Mobilize, pulmonary hygiene, PT Speech therapy for speaking valve placement Brovana, Pulmicort  HCAP/VAP, treated P:  Following off  antibiotics   Cardiogenic shock - s/p removal of RVAD 1/21; sternum now closed Atrial fibrillation Medication related hypotension.  Back on levophed since started on propofol VF arrest, brief, immediately post op - no further incidence. Prolonged qT P: Amiodarone Milrinone discontinued on 2/3, good volume removal Scheduled spironolactone and Lasix Postop care per TCTS and cardiology  Hypokalemia P: Replace potassium this morning Replace electrolytes daily as indicated  Hypothyroidism P: Continue Synthroid  Hyperglycemia P: Scheduled Levemir Insulin per sliding scale  Constipation P: Senokot QD, hold for diarrhea Dulcolax Mobilize  Acute Encephalopathy Depression Agitated Delirium - husband states she is "never still" at home; improving P: RASS GOAL: 0 Fentanyl and Precedex off Continue scheduled clonazepam, goal to decrease as her mental status improves Continue Seroquel 100 nightly Scheduled Effexor Haldol available as needed    FAMILY  - Updates: No family present 2/3 morning  Independent CC time 31 minutes  Baltazar Apo, MD, PhD 07/30/2017, 7:24 AM [facility] [phone] or if no answer [phone]

## 2017-07-30 NOTE — Progress Notes (Signed)
Pt given morning neb tx and then placed on ATC at 50%.  Pt is tolerating well.  RN aware.  RT will continue to monitor.

## 2017-07-30 NOTE — Progress Notes (Signed)
Patient ID: [name], female   DOB: 17-Feb-1941, 77 y.o.   MRN: [id number]     Advanced Heart Failure Rounding Note  Primary Cardiologist: Dr. Percival Spanish (2014) HF: (New) Dr. Haroldine Laws   Subjective:    Events - 07/10/17 S/p AVR and ascending AA replacement  - 07/10/17 VF Arrest in evening with shock x 1 - 07/11/17 RVAD placement - 07/17/2017 RVAD removed with chest closed -1/23 Extubated. Went in A fib. Amio started.  -1/24 Reintubated.  -1/29 Trach  Milrinone stopped yesterday. Continue to diurese well CVP 10. Co-ox 72%.  Had to go back on vent last night due to fatigue. Now back on trach collar. Seems anxious. Denies SOB.   Echo (07/22/17): EF 65-70%, bioprosthetic aortic valve functioning normally, PASP 33 mmHg, RV looks ok.   Objective:   Weight Range: 85.1 kg (187 lb 9.8 oz) Body mass index is 31.22 kg/m.   Vital Signs:   Temp:  [97.4 F (36.3 C)-98.5 F (36.9 C)] 98.2 F (36.8 C) (02/03 1118) Pulse Rate:  [66-114] 107 (02/03 1000) Resp:  [12-31] 31 (02/03 1000) BP: (76-157)/(50-88) 112/70 (02/03 1000) SpO2:  [88 %-96 %] 90 % (02/03 1000) FiO2 (%):  [40 %-60 %] 50 % (02/03 0825) Weight:  [85.1 kg (187 lb 9.8 oz)] 85.1 kg (187 lb 9.8 oz) (02/03 0500) Last BM Date: 07/30/17  Weight change: Filed Weights   07/28/17 0500 07/29/17 0404 07/30/17 0500  Weight: 88.3 kg (194 lb 10.7 oz) 82.1 kg (180 lb 16 oz) 85.1 kg (187 lb 9.8 oz)    Intake/Output:   Intake/Output Summary (Last 24 hours) at 07/30/2017 1143 Last data filed at 07/30/2017 1010 Gross per 24 hour  Intake 1296.6 ml  Output 3575 ml  Net -2278.4 ml    Physical Exam  CVP 10   General:  Sitting in chair sprawled out but follows commands. On trach collar Pulling at NGT NAD HEENT: normal Neck: + trach supple. JVP 10. Carotids 2+ bilat; no bruits. No lymphadenopathy or thryomegaly appreciated. Cor: Wounds look good. PMI nondisplaced. Regular rate & rhythm. No rubs, gallops or murmurs. Lungs: Rhonchorous    Abdomen: soft, nontender, nondistended. No hepatosplenomegaly. No bruits or masses. Good bowel sounds. Extremities: no cyanosis, clubbing, rash, tr edema Neuro: alert & orientedx3, cranial nerves grossly intact. moves all 4 extremities w/o difficulty. Affect pleasant  Telemetry   NSR 90-105s, personally reviewed.   EKG    No new tracings.  Labs    CBC Recent Labs    07/29/17 0305 07/29/17 1646 07/30/17 0703  WBC 7.8  --  9.6  HGB 10.3* 13.3 11.9*  HCT 34.2* 39.0 39.5  MCV 99.7  --  100.3*  PLT 404*  --  824*   Basic Metabolic Panel Recent Labs    07/28/17 0521  07/29/17 0305 07/29/17 1646 07/30/17 0142  NA 140   < > 141 143 144  K 4.1   < > 3.2* 4.1 3.2*  CL 96*   < > 95* 95* 97*  CO2 31   < > 33*  --  34*  GLUCOSE 132*   < > 83 120* 124*  BUN 28*   < > 32* 27* 26*  CREATININE 0.63   < > 0.59 0.50 0.60  CALCIUM 7.1*   < > 7.4*  --  7.7*  MG 1.8  --   --   --  2.0  PHOS 2.0*  --   --   --   --    < > =  values in this interval not displayed.   Liver Function Tests Recent Labs    07/28/17 0521 07/29/17 0305  AST 70* 104*  ALT 45 61*  ALKPHOS 182* 178*  BILITOT 1.5* 1.2  PROT 5.8* 5.9*  ALBUMIN 2.9* 2.9*   No results for input(s): LIPASE, AMYLASE in the last 72 hours. Cardiac Enzymes No results for input(s): CKTOTAL, CKMB, CKMBINDEX, TROPONINI in the last 72 hours.  BNP: BNP (last 3 results) No results for input(s): BNP in the last 8760 hours.  ProBNP (last 3 results) No results for input(s): PROBNP in the last 8760 hours.   D-Dimer No results for input(s): DDIMER in the last 72 hours. Hemoglobin A1C No results for input(s): HGBA1C in the last 72 hours. Fasting Lipid Panel No results for input(s): CHOL, HDL, LDLCALC, TRIG, CHOLHDL, LDLDIRECT in the last 72 hours. Thyroid Function Tests No results for input(s): TSH, T4TOTAL, T3FREE, THYROIDAB in the last 72 hours.  Invalid input(s): FREET3  Other results:   Imaging    Dg Chest Port 1  View  Result Date: 07/30/2017 CLINICAL DATA:  Acute respiratory failure EXAM: PORTABLE CHEST 1 VIEW COMPARISON:  07/29/2017 FINDINGS: Tracheostomy in satisfactory position. Cardiomegaly.  Prosthetic valve. No frank interstitial edema. Blunting of the left costophrenic angle, raising the possibility of a small left pleural effusion. No pneumothorax. Right arm PICC terminates at the cavoatrial junction. Enteric tube courses into the duodenum. Surgical clips overlying the neck, right chest wall/axilla, and right upper abdomen. Skin staples overlying the right lateral chest wall/axilla. IMPRESSION: Possible small left pleural effusion.  No frank interstitial edema. Tracheostomy in satisfactory position. Additional support apparatus as above. Postsurgical changes, as above. Electronically Signed   By: Julian Hy M.D.   On: 07/30/2017 09:40    Medications:    Scheduled Medications: . amiodarone  200 mg Oral BID  . arformoterol  15 mcg Nebulization BID  . aspirin EC  325 mg Oral Daily   Or  . aspirin  324 mg Per Tube Daily  . bisacodyl  10 mg Oral Daily   Or  . bisacodyl  10 mg Rectal Daily  . budesonide (PULMICORT) nebulizer solution  0.5 mg Nebulization BID  . chlorhexidine gluconate (MEDLINE KIT)  15 mL Mouth Rinse BID  . Chlorhexidine Gluconate Cloth  6 each Topical Daily  . clonazepam  1 mg Oral Q8H  . enoxaparin (LOVENOX) injection  40 mg Subcutaneous Q24H  . feeding supplement (PRO-STAT SUGAR FREE 64)  30 mL Per Tube TID  . furosemide  80 mg Intravenous BID  . Gerhardt's butt cream   Topical BID  . insulin aspart  0-24 Units Subcutaneous Q4H  . insulin detemir  10 Units Subcutaneous BID  . latanoprost  1 drop Both Eyes QHS  . levalbuterol  1.25 mg Nebulization Q6H  . levothyroxine  100 mcg Oral QAC breakfast  . mouth rinse  15 mL Mouth Rinse QID  . naphazoline-glycerin  1-2 drop Both Eyes BID  . pantoprazole (PROTONIX) IV  40 mg Intravenous Q12H  . QUEtiapine  100 mg Oral QHS    . senna-docusate  2 tablet Oral Daily  . sodium chloride flush  10-40 mL Intracatheter Q12H  . spironolactone  25 mg Oral Daily  . venlafaxine XR  75 mg Oral Daily  . zolpidem  5 mg Oral QHS    Infusions: . sodium chloride 250 mL (07/22/17 1820)  . feeding supplement (VITAL 1.5 CAL) 1,000 mL (07/30/17 0600)  . lactated ringers Stopped (  07/17/17 1200)    PRN Medications: haloperidol lactate, metoprolol tartrate, ondansetron (ZOFRAN) IV  Patient Profile   [name] is a 77 y.o. female with history of fusiform ascending aortic aneurysm, HTN, and HLD.   Admitted 07/10/17 for planned AVR and replacement of ascending aortic aneurysm. Developed acute cardiogenic shock + VF arrest. Taken back to OR am of 07/11/17 for evacuation of hematoma and placement of RVAD.   Assessment/Plan   1. Cardiogenic shock s/p cardiac surgery with R>L CHF:  Pre-op Echo 06/02/2017 LVEF 55-60%, Grade 1 DD, Mild/Mod AI with LVOT, Mid ascending aortic diameter 48.88 mm, Mild LAE, RV normal.  Post-op shock, RVAD placed now s/p RVAD removal 07/17/2017.  Echo 1/26 with EF 65-70%, bioprosthetic aortic valve, improved RV function.  - Coox 72% this am off pressors  - CVP 8-10. Received lasix 80 IV this am and then stopped by TCTS.  - CXR without edema. (Personally reviewed) - Suspect she will need daily doses of lasix over next few days to assist with pulmonary toilet . Will dose as needed - RV function looked better on echo 07/22/16.  2. VF arrest:  - Rhythm currently stable on po amio  - Goal K > 4.0 and Mg > 2.0 - Mag 2.0 - K 3.2 will supp  3. Post Op Atrial fibrillation:  - Remains in NSR on po amio. - Only short run post op. So no AC for now.  - Currently only getting lovenox --->DVT prophylaxis. 4. Acute respiratory failure: Reintubated 1/24.   - S/P Trach 1/29.  - Now weaning with trach collar  - Continues on ceftazidime per CCM.  - Diurese as tolerated  5. S/p AVR and Ascending aortic aneurysm  replacement. - Post-op echo stable. 6. AKI:  - Resolved 7. Anemia:  - Hgb up to 11.9 - s/p 1uPRBCs 07/23/17. 8. Hypokalemia - K3.2 will supp - Continue to follow.   Improving slowly but still needing to be vented at night. Suspect she will need LTAC.  Length of Stay: 20   Glori Bickers, MD  11:43 AM Advanced Heart Failure Team Pager [phone] (M-F; 7a - 4p)  Please contact [facility] for night-coverage after hours (4p -7a ) and weekends on [email or web address]

## 2017-07-30 NOTE — Progress Notes (Signed)
[facility] Progress Note Patient Name: [name] DOB: 1940-12-04 MRN: [id number]   Date of Service  07/30/2017  HPI/Events of Note  Agitation - Request for bilateral soft wrist restraints.  eICU Interventions  Will order bilateral soft wrist restraints.      Intervention Category Minor Interventions: Agitation / anxiety - evaluation and management  [name] 07/30/2017, 12:20 AM

## 2017-07-31 ENCOUNTER — Inpatient Hospital Stay (HOSPITAL_COMMUNITY): Payer: Medicare Other

## 2017-07-31 LAB — GLUCOSE, CAPILLARY
Glucose-Capillary: 134 mg/dL — ABNORMAL HIGH (ref 65–99)
Glucose-Capillary: 134 mg/dL — ABNORMAL HIGH (ref 65–99)
Glucose-Capillary: 115 mg/dL — ABNORMAL HIGH (ref 65–99)
Glucose-Capillary: 107 mg/dL — ABNORMAL HIGH (ref 65–99)
Glucose-Capillary: 102 mg/dL — ABNORMAL HIGH (ref 65–99)
Glucose-Capillary: 115 mg/dL — ABNORMAL HIGH (ref 65–99)

## 2017-07-31 LAB — POCT I-STAT, CHEM 8
BUN: 31 mg/dL — ABNORMAL HIGH (ref 6–20)
Calcium, Ion: 1 mmol/L — ABNORMAL LOW (ref 1.15–1.40)
Chloride: 103 mmol/L (ref 101–111)
Creatinine, Ser: 0.5 mg/dL (ref 0.44–1.00)
Glucose, Bld: 110 mg/dL — ABNORMAL HIGH (ref 65–99)
HCT: 41 % (ref 36.0–46.0)
Hemoglobin: 13.9 g/dL (ref 12.0–15.0)
Potassium: 3.8 mmol/L (ref 3.5–5.1)
Sodium: 148 mmol/L — ABNORMAL HIGH (ref 135–145)
TCO2: 34 mmol/L — ABNORMAL HIGH (ref 22–32)

## 2017-07-31 LAB — CBC
HCT: 40.4 % (ref 36.0–46.0)
Hemoglobin: 11.8 g/dL — ABNORMAL LOW (ref 12.0–15.0)
MCH: 29.7 pg (ref 26.0–34.0)
MCHC: 29.2 g/dL — ABNORMAL LOW (ref 30.0–36.0)
MCV: 101.8 fL — ABNORMAL HIGH (ref 78.0–100.0)
PLATELETS: 471 10*3/uL — AB (ref 150–400)
RBC: 3.97 MIL/uL (ref 3.87–5.11)
RDW: 17.8 % — AB (ref 11.5–15.5)
WBC: 9 10*3/uL (ref 4.0–10.5)

## 2017-07-31 LAB — BASIC METABOLIC PANEL
Anion gap: 11 (ref 5–15)
BUN: 31 mg/dL — AB (ref 6–20)
CALCIUM: 8 mg/dL — AB (ref 8.9–10.3)
CO2: 33 mmol/L — ABNORMAL HIGH (ref 22–32)
CREATININE: 0.67 mg/dL (ref 0.44–1.00)
Chloride: 103 mmol/L (ref 101–111)
GFR calc non Af Amer: >60 mL/min (ref >60)
Glucose, Bld: 132 mg/dL — ABNORMAL HIGH (ref 65–99)
Potassium: 3.8 mmol/L (ref 3.5–5.1)
SODIUM: 147 mmol/L — AB (ref 135–145)

## 2017-07-31 MED ORDER — CLONAZEPAM 0.5 MG PO TBDP
1.0000 mg | ORAL_TABLET | Freq: Two times a day (BID) | ORAL | Status: DC
  Administered 2017-07-31 – 2017-08-01 (×2): 1 mg via ORAL
  Filled 2017-07-31 (×2): qty 2

## 2017-07-31 NOTE — Progress Notes (Signed)
OT Treatment Note  Pt seen as cotreat with PT; Pt given Ambien last night which may have affected performance today. Pt required Max A +2 for bed mobility and total A +2 for sit - modified stand and squat pivot transfer to chair. Pt able to sit EOB for @ 2 min with S after activities to increase anterior weight shift to increase upright and midline postural control. R lateral bias in sitting. Max to total A with ADL. Cued to open eyes during session and had difficulty sustaining attention to task. O2 96 on 98% FIO2/10L with RR 33 after activity. Pt will need to make significant progress to continue to progress toward CIR level therapies. Will continue to follow to maximize functional level of independence.   07/31/17 1344  OT Visit Information  Last OT Received On 07/31/17  Assistance Needed +2  PT/OT/SLP Co-Evaluation/Treatment Yes  Reason for Co-Treatment Complexity of the patient's impairments (multi-system involvement);Necessary to address cognition/behavior during functional activity;For patient/therapist safety;To address functional/ADL transfers  PT goals addressed during session Mobility/safety with mobility  OT goals addressed during session ADL's and self-care  History of Present Illness Pt admitted 07/10/17 for AVR and ascending AA replacement. Post procedure had VF arrest. 07/11/17 back to OR to evacuate hematoma and place RVAD due to fluid overload and R systolic HF. RVAD subsequently removed. Pt intubated from 07/10/17-07/19/17. Re-intubated 1/24-1/29; trach placed 1/29. PMH: PVD, afib, vfib, hypothyroidism, anxiety and depression, HTN, HLD, R TKA.  Precautions  Precautions Fall;Sternal  Pain Assessment  Pain Assessment Faces  Faces Pain Scale 4  Pain Location generalized discomfort  Pain Descriptors / Indicators Grimacing;Guarding  Pain Intervention(s) Limited activity within patient's tolerance  Cognition  Arousal/Alertness Lethargic  Behavior During Therapy Flat affect;Restless   Overall Cognitive Status Difficult to assess  Area of Impairment Safety/judgement;Following commands;Memory  Current Attention Level Sustained  Memory Decreased short-term memory  Following Commands Follows one step commands with increased time;Follows one step commands consistently  Safety/Judgement Decreased awareness of safety  General Comments following 1 step commands but increased lethargy today - ?related to meds; will further assess  Difficult to assess due to Tracheostomy  ADL  Overall ADL's  Needs assistance/impaired  Eating/Feeding NPO  Grooming Wash/dry face;Maximal assistance;Sitting  Grooming Details (indicate cue type and reason) Pt able to bring hand to mouth with min A to wash wash  Upper Body Bathing Maximal assistance;Sitting  Lower Body Bathing Total assistance;Bed level  Toilet Transfer Total assistance;+2 for physical assistance (simulated)  Toileting- Clothing Manipulation and Hygiene Total assistance  Functional mobility during ADLs Total assistance;+2 for physical assistance  Bed Mobility  Overal bed mobility Needs Assistance  Bed Mobility Rolling;Sidelying to Sit;Sit to Sidelying  Rolling Max assist;+2 for physical assistance  Sidelying to sit Max assist;+2 for physical assistance;HOB elevated  General bed mobility comments Pt attempted to help move BLE off bed. Use of bedpad to move pt to EOB  Balance  Overall balance assessment Needs assistance  Sitting-balance support Feet supported;Bilateral upper extremity supported  Sitting balance-Leahy Scale Poor  Sitting balance - Comments Pt with decreased sitting balance and form.  Pt presents with lateral lean to the R with posterior pelvic tilt which causes a posterior lean.  Pt required mod to max assist +1 for sitting balance and with cueing and assistance to correct posture patient able to perform static hold unassisted for brief periods of time.  Postural control Posterior lean;Right lateral lean  Standing  balance-Leahy Scale Zero  Standing balance comment unable to  achieve upright posture  Restrictions  Weight Bearing Restrictions Yes  Other Position/Activity Restrictions sternal precautions  Vision- Assessment  Additional Comments will further assess  Transfers  Overall transfer level Needs assistance  Equipment used 2 person hand held assist  Transfers Sit to/from Stand;Squat Pivot Transfers  Sit to Stand Total assist;+2 physical assistance (unable to stand fully erect with significant hip flexion and decreased upper trunk control.  )  Squat pivot transfers Total assist;+2 physical assistance  General transfer comment Pt performed partial sit to stand and unable to stand erect for transfer to chair.  PTA and OT opted for squat pivot to recliner chair with use of bed pad.  Chair placed close with R arm dropped to improve ease.  Once in recliner patient required total assistanc to scoot back into the recliner chair.    Exercises  Exercises Other exercises  Other Exercises  Other Exercises encouraged B hand grip strengthening by squeezing washcloths  OT - End of Session  Equipment Utilized During Treatment Oxygen (98% FIO2 10L)  Activity Tolerance Patient tolerated treatment well (Pt smiled SO BIG and very motivated to work with therapy)  Patient left with call bell/phone within reach;in chair;with chair alarm set;with family/visitor present  Nurse Communication Mobility status  OT Assessment/Plan  OT Plan Discharge plan remains appropriate;Frequency remains appropriate  OT Visit Diagnosis Unsteadiness on feet (R26.81);Pain;Muscle weakness (generalized) (M62.81);Other symptoms and signs involving cognitive function  OT Frequency (ACUTE ONLY) Min 2X/week  Recommendations for Other Services Rehab consult  Follow Up Recommendations CIR  OT Equipment 3 in 1 bedside commode  AM-PAC OT "6 Clicks" Daily Activity Outcome Measure  Help from another person eating meals? 1  Help from another  person taking care of personal grooming? 2  Help from another person toileting, which includes using toliet, bedpan, or urinal? 1  Help from another person bathing (including washing, rinsing, drying)? 1  Help from another person to put on and taking off regular upper body clothing? 1  Help from another person to put on and taking off regular lower body clothing? 1  6 Click Score 7  ADL G Code Conversion CM  OT Goal Progression  Progress towards OT goals Progressing toward goals  Acute Rehab OT Goals  Patient Stated Goal Unable due to trach, mouthing she is worried about her breathing  OT Goal Formulation With patient  Time For Goal Achievement 08/02/17  Potential to Achieve Goals Good  ADL Goals  Pt Will Perform Toileting - Clothing Manipulation and hygiene with min assist;sit to/from stand  Pt Will Perform Eating Independently;sitting  Pt Will Perform Grooming with min assist;sitting  Pt Will Perform Upper Body Dressing with min assist;sitting  Pt Will Transfer to Toilet with mod assist;stand pivot transfer;bedside commode  Additional ADL Goal #1 Pt will state sternal precautions.  Additional ADL Goal #2 Pt will sit EOB x 10 minutes with supervision in preparation for ADL.  OT Time Calculation  OT Start Time (ACUTE ONLY) 1113  OT Stop Time (ACUTE ONLY) 1142  OT Time Calculation (min) 29 min  OT General Charges  $OT Visit 1 Visit  OT Treatments  $Self Care/Home Management  8-22 mins  [facility], OT/L  [phone] 07/31/2017

## 2017-07-31 NOTE — Progress Notes (Signed)
SLP Cancellation Note  Patient Details Name: [name] MRN: [id number] DOB: [birth year]   Cancelled treatment:       Reason Eval/Treat Not Completed: Fatigue/lethargy limiting ability to participate. Pt is reported having limited arousal and reduced attention; therefore, PMV trials not appropriate at this time. SLP will follow up when pt more alert for continued PMV trials.    [name] SLP Student Clinician   [name] 07/31/2017, 2:54 PM

## 2017-07-31 NOTE — Progress Notes (Addendum)
[facility] DAILY ICU PROGRESS NOTE                   [address]            [place] [address]          [phone]   14 Days Post-Op Procedure(s) (LRB): REMOVAL OF RVAD WITH PUMP STANDBY (N/A) TRANSESOPHAGEAL ECHOCARDIOGRAM (TEE) (N/A)  Total Length of Stay:  LOS: 21 days   Subjective:  Patient without complaints.  Awoke from sleep.  Not on vent this morning. Objective: Vital signs in last 24 hours: Temp:  [97.4 F (36.3 C)-98.6 F (37 C)] 97.6 F (36.4 C) (02/04 0312) Pulse Rate:  [88-110] 88 (02/04 0718) Cardiac Rhythm: Normal sinus rhythm (02/04 0400) Resp:  [16-41] 24 (02/04 0718) BP: (101-145)/(58-97) 117/65 (02/04 0718) SpO2:  [90 %-96 %] 95 % (02/04 0719) FiO2 (%):  [50 %-60 %] 50 % (02/04 0719) Weight:  [179 lb 7.3 oz (81.4 kg)] 179 lb 7.3 oz (81.4 kg) (02/04 0439)  Filed Weights   07/29/17 0404 07/30/17 0500 07/31/17 0439  Weight: 180 lb 16 oz (82.1 kg) 187 lb 9.8 oz (85.1 kg) 179 lb 7.3 oz (81.4 kg)    Weight change: -2.5 oz (-3.7 kg)   Hemodynamic parameters for last 24 hours: CVP:  [6 mmHg-13 mmHg] 7 mmHg  Intake/Output from previous day: 02/03 0701 - 02/04 0700 In: 1750 [NG/GT:1450; IV Piggyback:300] Out: 2080 [Urine:1840]  Intake/Output this shift: Total I/O In: 40 [NG/GT:40] Out: 115 [Urine:115]  Current Meds: Scheduled Meds: . amiodarone  200 mg Oral BID  . arformoterol  15 mcg Nebulization BID  . aspirin EC  325 mg Oral Daily   Or  . aspirin  324 mg Per Tube Daily  . bisacodyl  10 mg Oral Daily   Or  . bisacodyl  10 mg Rectal Daily  . budesonide (PULMICORT) nebulizer solution  0.5 mg Nebulization BID  . chlorhexidine gluconate (MEDLINE KIT)  15 mL Mouth Rinse BID  . Chlorhexidine Gluconate Cloth  6 each Topical Daily  . clonazepam  1 mg Oral Q8H  . enoxaparin (LOVENOX) injection  40 mg Subcutaneous Q24H  . feeding supplement (PRO-STAT SUGAR FREE 64)  30 mL Per Tube TID  . Davian Wollenberg's butt cream   Topical BID  . insulin  aspart  0-24 Units Subcutaneous Q4H  . insulin detemir  10 Units Subcutaneous BID  . latanoprost  1 drop Both Eyes QHS  . levalbuterol  1.25 mg Nebulization Q6H  . levothyroxine  100 mcg Oral QAC breakfast  . mouth rinse  15 mL Mouth Rinse QID  . naphazoline-glycerin  1-2 drop Both Eyes BID  . pantoprazole (PROTONIX) IV  40 mg Intravenous Q12H  . QUEtiapine  100 mg Oral QHS  . senna-docusate  2 tablet Oral Daily  . sodium chloride flush  10-40 mL Intracatheter Q12H  . spironolactone  25 mg Oral Daily  . venlafaxine XR  75 mg Oral Daily  . zolpidem  5 mg Oral QHS   Continuous Infusions: . sodium chloride 250 mL (07/22/17 1820)  . feeding supplement (VITAL 1.5 CAL) 1,000 mL (07/30/17 1800)  . lactated ringers Stopped (07/17/17 1200)   PRN Meds:.haloperidol lactate, metoprolol tartrate, ondansetron (ZOFRAN) IV  General appearance: cooperative and no distress Heart: regular rate and rhythm Lungs: clear to auscultation bilaterally Abdomen: soft, non-tender; bowel sounds normal; no masses,  no organomegaly Extremities: edema 1-2+ pitting Wound: clean and dry, staples remain in place  on axillary incision  Lab Results: CBC: Recent Labs    07/30/17 0703  07/30/17 1804 07/31/17 0427  WBC 9.6  --   --  9.0  HGB 11.9*   < > 13.6 11.8*  HCT 39.5   < > 40.0 40.4  PLT 570*  --   --  471*   < > = values in this interval not displayed.   BMET:  Recent Labs    07/30/17 0142  07/30/17 1804 07/31/17 0427  NA 144   < > 147* 147*  K 3.2*   < > 3.7 3.8  CL 97*   < > 98* 103  CO2 34*  --   --  33*  GLUCOSE 124*   < > 111* 132*  BUN 26*   < > 29* 31*  CREATININE 0.60   < > 0.60 0.67  CALCIUM 7.7*  --   --  8.0*   < > = values in this interval not displayed.    CMET: Lab Results  Component Value Date   WBC 9.0 07/31/2017   HGB 11.8 (L) 07/31/2017   HCT 40.4 07/31/2017   PLT 471 (H) 07/31/2017   GLUCOSE 132 (H) 07/31/2017   CHOL 165 03/15/2017   TRIG 530 (H) 07/23/2017   HDL  57 03/15/2017   LDLCALC 89 03/15/2017   ALT 61 (H) 07/29/2017   AST 104 (H) 07/29/2017   NA 147 (H) 07/31/2017   K 3.8 07/31/2017   CL 103 07/31/2017   CREATININE 0.67 07/31/2017   BUN 31 (H) 07/31/2017   CO2 33 (H) 07/31/2017   TSH 0.990 03/15/2017   INR 1.24 07/25/2017   HGBA1C 5.2 07/06/2017      PT/INR: No results for input(s): LABPROT, INR in the last 72 hours. Radiology: Dg Chest Port 1 View  Result Date: 07/31/2017 CLINICAL DATA:  Acute respiratory failure EXAM: PORTABLE CHEST 1 VIEW COMPARISON:  07/30/2017 FINDINGS: Cardiac shadow is enlarged. Postsurgical changes are again seen. Tracheostomy tube, feeding catheter and right-sided PICC line are again noted and stable. Lungs are well aerated bilaterally. Mild bibasilar atelectatic changes are seen. No bony abnormality is noted. IMPRESSION: Mild bibasilar atelectasis. Electronically Signed   By: Inez Catalina M.D.   On: 07/31/2017 07:15     Assessment/Plan: S/P Procedure(s) (LRB): REMOVAL OF RVAD WITH PUMP STANDBY (N/A) TRANSESOPHAGEAL ECHOCARDIOGRAM (TEE) (N/A)  1. CV- NSR, has been weaned off Milrinone, on Amiodarone currently,  2. Pulm- trach collar as tolerated, some atelectasis, continue pulm toilet 3. Renal- creatinine stable, on Aldactone per AHF 4. Anxiety disorders- continue home medications 5. GI- continue tube feeds, patient tolerating without issue 6. Dispo- patient stable, continue working on trach collar, out of bed to chair,  Continue diuresis    Erin Barrett 07/31/2017 8:00 AM   Staying off vent with trach collar today  I have seen and examined [name] and agree with the above assessment  and plan.  Grace Isaac MD Beeper [phone] Office [phone] 07/31/2017 7:28 PM

## 2017-07-31 NOTE — Progress Notes (Signed)
Patient ID: [name], female   DOB: Nov 03, 1940, 77 y.o.   MRN: [id number] EVENING ROUNDS NOTE :     [address]       [place] [address]             [phone]                 14 Days Post-Op Procedure(s) (LRB): REMOVAL OF RVAD WITH PUMP STANDBY (N/A) TRANSESOPHAGEAL ECHOCARDIOGRAM (TEE) (N/A)  Total Length of Stay:  LOS: 21 days  BP (!) 148/72   Pulse 89   Temp 98.4 F (36.9 C) (Oral)   Resp (!) 30   Ht 5\' 5"  (1.651 m)   Wt 179 lb 7.3 oz (81.4 kg)   SpO2 94%   BMI 29.86 kg/m   .Intake/Output      02/04 0701 - 02/05 0700   NG/GT 520   IV Piggyback    Total Intake(mL/kg) 520 (6.4)   Urine (mL/kg/hr) 415 (0.4)   Total Output 415   Net +105         . sodium chloride 250 mL (07/22/17 1820)  . feeding supplement (VITAL 1.5 CAL) 1,000 mL (07/31/17 1648)  . lactated ringers Stopped (07/17/17 1200)     Lab Results  Component Value Date   WBC 9.0 07/31/2017   HGB 13.9 07/31/2017   HCT 41.0 07/31/2017   PLT 471 (H) 07/31/2017   GLUCOSE 110 (H) 07/31/2017   CHOL 165 03/15/2017   TRIG 530 (H) 07/23/2017   HDL 57 03/15/2017   LDLCALC 89 03/15/2017   ALT 61 (H) 07/29/2017   AST 104 (H) 07/29/2017   NA 148 (H) 07/31/2017   K 3.8 07/31/2017   CL 103 07/31/2017   CREATININE 0.50 07/31/2017   BUN 31 (H) 07/31/2017   CO2 33 (H) 07/31/2017   TSH 0.990 03/15/2017   INR 1.24 07/25/2017   HGBA1C 5.2 07/06/2017   On trach collar today Likely will need ltac    Grace Isaac MD  Beeper [phone] Office [phone] 07/31/2017 7:25 PM

## 2017-07-31 NOTE — Progress Notes (Signed)
Patient ID: [name], female   DOB: 10-08-1940, 77 y.o.   MRN: [id number]     Advanced Heart Failure Rounding Note  Primary Cardiologist: Dr. Percival Spanish (2014) HF: (New) Dr. Haroldine Laws   Subjective:    Events - 07/10/17 S/p AVR and ascending AA replacement  - 07/10/17 VF Arrest in evening with shock x 1 - 07/11/17 RVAD placement - 07/17/2017 RVAD removed with chest closed -1/23 Extubated. Went in A fib. Amio started.  -1/24 Reintubated.  -1/29 Trach  Off vent for 24 hours. Tolerating trach collar 40%. Off diuretics 24 hours.   Sleepy this morning. Received ambien last night.   Echo (07/22/17): EF 65-70%, bioprosthetic aortic valve functioning normally, PASP 33 mmHg, RV looks ok.   Objective:   Weight Range: 179 lb 7.3 oz (81.4 kg) Body mass index is 29.86 kg/m.   Vital Signs:   Temp:  [97.4 F (36.3 C)-98.6 F (37 C)] 97.9 F (36.6 C) (02/04 0800) Pulse Rate:  [88-110] 88 (02/04 0800) Resp:  [16-41] 18 (02/04 0800) BP: (101-145)/(58-97) 114/67 (02/04 0800) SpO2:  [90 %-96 %] 96 % (02/04 0800) FiO2 (%):  [50 %] 50 % (02/04 0719) Weight:  [179 lb 7.3 oz (81.4 kg)] 179 lb 7.3 oz (81.4 kg) (02/04 0439) Last BM Date: 07/30/17  Weight change: Filed Weights   07/29/17 0404 07/30/17 0500 07/31/17 0439  Weight: 180 lb 16 oz (82.1 kg) 187 lb 9.8 oz (85.1 kg) 179 lb 7.3 oz (81.4 kg)    Intake/Output:   Intake/Output Summary (Last 24 hours) at 07/31/2017 0848 Last data filed at 07/31/2017 0800 Gross per 24 hour  Intake 1550 ml  Output 1765 ml  Net -215 ml    Physical Exam  CVP 6-7  General:  On trach collar. No resp difficulty. In bed.  HEENT: normal except trach with thick secretions Neck: supple. JVP 6-7 . Carotids 2+ bilat; no bruits. No lymphadenopathy or thryomegaly appreciated. Cor: PMI nondisplaced. Regular rate & rhythm. No rubs, gallops or murmurs. Lungs: diffuse rhnchi on 40% trach collar Abdomen: soft, nontender, nondistended. No hepatosplenomegaly. No bruits  or masses. Good bowel sounds. Extremities: no cyanosis, clubbing, rash, RUE PICC edema. RLE LLE SCDs.  Neuro: sleeping.    Telemetry   NSR 80-90s personally reviewed.   EKG    No new tracings.  Labs    CBC Recent Labs    07/30/17 0703  07/30/17 1804 07/31/17 0427  WBC 9.6  --   --  9.0  HGB 11.9*   < > 13.6 11.8*  HCT 39.5   < > 40.0 40.4  MCV 100.3*  --   --  101.8*  PLT 570*  --   --  471*   < > = values in this interval not displayed.   Basic Metabolic Panel Recent Labs    07/30/17 0142  07/30/17 1804 07/31/17 0427  NA 144   < > 147* 147*  K 3.2*   < > 3.7 3.8  CL 97*   < > 98* 103  CO2 34*  --   --  33*  GLUCOSE 124*   < > 111* 132*  BUN 26*   < > 29* 31*  CREATININE 0.60   < > 0.60 0.67  CALCIUM 7.7*  --   --  8.0*  MG 2.0  --   --   --    < > = values in this interval not displayed.   Liver Function Tests Recent Labs  07/29/17 0305  AST 104*  ALT 61*  ALKPHOS 178*  BILITOT 1.2  PROT 5.9*  ALBUMIN 2.9*   No results for input(s): LIPASE, AMYLASE in the last 72 hours. Cardiac Enzymes No results for input(s): CKTOTAL, CKMB, CKMBINDEX, TROPONINI in the last 72 hours.  BNP: BNP (last 3 results) No results for input(s): BNP in the last 8760 hours.  ProBNP (last 3 results) No results for input(s): PROBNP in the last 8760 hours.   D-Dimer No results for input(s): DDIMER in the last 72 hours. Hemoglobin A1C No results for input(s): HGBA1C in the last 72 hours. Fasting Lipid Panel No results for input(s): CHOL, HDL, LDLCALC, TRIG, CHOLHDL, LDLDIRECT in the last 72 hours. Thyroid Function Tests No results for input(s): TSH, T4TOTAL, T3FREE, THYROIDAB in the last 72 hours.  Invalid input(s): FREET3  Other results:   Imaging    Dg Chest Port 1 View  Result Date: 07/31/2017 CLINICAL DATA:  Acute respiratory failure EXAM: PORTABLE CHEST 1 VIEW COMPARISON:  07/30/2017 FINDINGS: Cardiac shadow is enlarged. Postsurgical changes are again  seen. Tracheostomy tube, feeding catheter and right-sided PICC line are again noted and stable. Lungs are well aerated bilaterally. Mild bibasilar atelectatic changes are seen. No bony abnormality is noted. IMPRESSION: Mild bibasilar atelectasis. Electronically Signed   By: Mark  Lukens M.D.   On: 07/31/2017 07:15    Medications:    Scheduled Medications: . amiodarone  200 mg Oral BID  . arformoterol  15 mcg Nebulization BID  . aspirin EC  325 mg Oral Daily   Or  . aspirin  324 mg Per Tube Daily  . bisacodyl  10 mg Oral Daily   Or  . bisacodyl  10 mg Rectal Daily  . budesonide (PULMICORT) nebulizer solution  0.5 mg Nebulization BID  . chlorhexidine gluconate (MEDLINE KIT)  15 mL Mouth Rinse BID  . Chlorhexidine Gluconate Cloth  6 each Topical Daily  . clonazepam  1 mg Oral BID  . enoxaparin (LOVENOX) injection  40 mg Subcutaneous Q24H  . feeding supplement (PRO-STAT SUGAR FREE 64)  30 mL Per Tube TID  . Gerhardt's butt cream   Topical BID  . insulin aspart  0-24 Units Subcutaneous Q4H  . insulin detemir  10 Units Subcutaneous BID  . latanoprost  1 drop Both Eyes QHS  . levalbuterol  1.25 mg Nebulization Q6H  . levothyroxine  100 mcg Oral QAC breakfast  . mouth rinse  15 mL Mouth Rinse QID  . naphazoline-glycerin  1-2 drop Both Eyes BID  . pantoprazole (PROTONIX) IV  40 mg Intravenous Q12H  . QUEtiapine  100 mg Oral QHS  . senna-docusate  2 tablet Oral Daily  . sodium chloride flush  10-40 mL Intracatheter Q12H  . spironolactone  25 mg Oral Daily  . venlafaxine XR  75 mg Oral Daily  . zolpidem  5 mg Oral QHS    Infusions: . sodium chloride 250 mL (07/22/17 1820)  . feeding supplement (VITAL 1.5 CAL) 1,000 mL (07/30/17 1800)  . lactated ringers Stopped (07/17/17 1200)    PRN Medications: haloperidol lactate, metoprolol tartrate, ondansetron (ZOFRAN) IV  Patient Profile   [name] is a 77 y.o. female with history of fusiform ascending aortic aneurysm, HTN, and HLD.     Admitted 07/10/17 for planned AVR and replacement of ascending aortic aneurysm. Developed acute cardiogenic shock + VF arrest. Taken back to OR am of 07/11/17 for evacuation of hematoma and placement of RVAD.   Assessment/Plan     1. Cardiogenic shock s/p cardiac surgery with R>L CHF:  Pre-op Echo 06/02/2017 LVEF 55-60%, Grade 1 DD, Mild/Mod AI with LVOT, Mid ascending aortic diameter 48.88 mm, Mild LAE, RV normal.  Post-op shock, RVAD placed now s/p RVAD removal 07/17/2017.  Echo 1/26 with EF 65-70%, bioprosthetic aortic valve, improved RV function.  - CVP 6-7. Off diuretics.   - Continue 25 mg spironolactone daily.  -  RV function looked better on echo 07/22/16.  2. VF arrest:  - Rhythm currently stable on po amio  - Goal K > 4.0 and Mg > 2.0 - Check Mag in am.  3. Post Op Atrial fibrillation:  -- Only short run post op. So no AC for now. - Maintaining NSR.   - Currently only getting lovenox --->DVT prophylaxis. 4. Acute respiratory failure: Reintubated 1/24.   - S/P Trach 1/29.  - Tolerating trach collar.  Off vent 24 hours.  - Has completed ceftazidime per CCM.  - Diurese as tolerated  5. S/p AVR and Ascending aortic aneurysm replacement. - Post-op echo stable. 6. AKI:  - Resolved 7. Anemia:  - Hgb stable 11.8  - s/p 1uPRBCs 07/23/17. 8. Hypokalemia - K 3.8.  - Continue to follow.   .  Length of Stay: [place], NP  8:48 AM Advanced Heart Failure Team Pager [phone] (M-F; 7a - 4p)  Please contact [facility] for night-coverage after hours (4p -7a ) and weekends on [email or web address]  Patient seen and examined with Darrick Grinder, NP. We discussed all aspects of the encounter. I agree with the assessment and plan as stated above.   Remains very tenuous and frustrated. Volume status improved but respiratory status remains very tenuous. Now with heavy secretions from trach. Has completed several rounds of abx. Will reculture tracheal secretions. Remains in SR on po amio. EF  stable on echo. Can give more lasix as needed.   I suspect she will LTAC.   Glori Bickers, MD  5:24 PM

## 2017-07-31 NOTE — Progress Notes (Signed)
..  Name: [name] MRN: [id number] DOB: 01/20/1941    ADMISSION DATE:  07/10/2017  CONSULTATION DATE:  07/12/17  REFERRING MD :  Nils Pyle MD   BRIEF:   77 year old female with past medical history significant for fusiform ascending aortic aneurysm, hypertension, hyperlipidemia, status post AVR and replacement of ascending aortic aneurysm.  On 07/10/2017 postprocedure patient developed acute cardiogenic shock had an episode of V. fib arrest was taken back to the OR on 07/11/2017 for evacuation of hematoma and placement of RVAD.  PCCM consulted for ventilator management.    SUBJECTIVE:   Off vent x 24 hours.  Sleepy this am, had ambien last night.  Remains off milrinone.   VITAL SIGNS: Temp:  [97.4 F (36.3 C)-98.6 F (37 C)] 97.9 F (36.6 C) (02/04 0800) Pulse Rate:  [88-106] 88 (02/04 0800) Resp:  [16-41] 18 (02/04 0800) BP: (104-145)/(58-97) 114/67 (02/04 0800) SpO2:  [90 %-96 %] 96 % (02/04 0800) FiO2 (%):  [50 %] 50 % (02/04 0719) Weight:  [81.4 kg (179 lb 7.3 oz)] 81.4 kg (179 lb 7.3 oz) (02/04 0439)   Filed Weights   07/29/17 0404 07/30/17 0500 07/31/17 0439  Weight: 82.1 kg (180 lb 16 oz) 85.1 kg (187 lb 9.8 oz) 81.4 kg (179 lb 7.3 oz)    Intake/Output Summary (Last 24 hours) at 07/31/2017 1003 Last data filed at 07/31/2017 0800 Gross per 24 hour  Intake 1320 ml  Output 1065 ml  Net 255 ml     PHYSICAL EXAMINATION: General: Ill-appearing woman, NAD on ATC  HEENT: Trach midline, c/d Neuro: sleepy but wakes no voice, nods, confused, follows some commands  CV: Regular, distant, no murmur, midline surgical scar, right shoulder staples PULM: resps even non labored on ATC, Coarse bilateral breath sounds GI: Soft, nontender, positive bowel sounds Extremities: scant BLE edema  Skin: No rash  PULMONARY  CBC Recent Labs  Lab 07/29/17 0305  07/30/17 0703 07/30/17 1755 07/30/17 1804 07/31/17 0427  HGB 10.3*   < > 11.9* 11.2* 13.6 11.8*  HCT 34.2*   < > 39.5  33.0* 40.0 40.4  WBC 7.8  --  9.6  --   --  9.0  PLT 404*  --  570*  --   --  471*   < > = values in this interval not displayed.    COAGULATION Recent Labs  Lab 07/25/17 0350  INR 1.24    CARDIAC  No results for input(s): TROPONINI in the last 168 hours. No results for input(s): PROBNP in the last 168 hours.   CHEMISTRY Recent Labs  Lab 07/25/17 0350  07/26/17 0341  07/27/17 0320  07/28/17 0521  07/29/17 0017 07/29/17 0305 07/29/17 1646 07/30/17 0142 07/30/17 1755 07/30/17 1804 07/31/17 0427  NA 138   < > 135   < > 137   < > 140   < > 140 141 143 144 149* 147* 147*  K 3.1*   < > 2.6*   < > 3.1*   < > 4.1   < > 3.8 3.2* 4.1 3.2* 2.9* 3.7 3.8  CL 98*   < > 96*   < > 95*   < > 96*   < > 94* 95* 95* 97* 105 98* 103  CO2 28   < > 27   < > 30  --  31  --  33* 33*  --  34*  --   --  33*  GLUCOSE 162*   < > 115*   < >  134*   < > 132*   < > 137* 83 120* 124* 92 111* 132*  BUN 30*   < > 22*   < > 28*   < > 28*   < > 34* 32* 27* 26* 25* 29* 31*  CREATININE 0.71   < > 0.62   < > 0.63   < > 0.63   < > 0.59 0.59 0.50 0.60 0.40* 0.60 0.67  CALCIUM 6.5*   < > 6.7*   < > 6.7*  --  7.1*  --  7.3* 7.4*  --  7.7*  --   --  8.0*  MG 1.8  --  1.9  --  2.1  --  1.8  --   --   --   --  2.0  --   --   --   PHOS 3.2  --  3.1  --  3.3  --  2.0*  --   --   --   --   --   --   --   --    < > = values in this interval not displayed.   Estimated Creatinine Clearance: 64.1 mL/min (by C-G formula based on SCr of 0.67 mg/dL).   LIVER Recent Labs  Lab 07/25/17 0350 07/26/17 0341 07/27/17 0320 07/28/17 0521 07/29/17 0305  AST 48* 50* 66* 70* 104*  ALT 33 33 39 45 61*  ALKPHOS 194* 165* 171* 182* 178*  BILITOT 1.5* 1.3* 1.1 1.5* 1.2  PROT 5.6* 5.1* 5.5* 5.8* 5.9*  ALBUMIN 3.0* 2.8* 2.8* 2.9* 2.9*  INR 1.24  --   --   --   --     INFECTIOUS Recent Labs  Lab 07/24/17 2300 07/26/17 0341 07/27/17 0320  LATICACIDVEN 0.7  --   --   PROCALCITON  --  0.52 1.14    ENDOCRINE CBG (last 3)   Recent Labs    07/30/17 2314 07/31/17 0310 07/31/17 0802  GLUCAP 134* 115* 107*     IMAGING  Dg Chest Port 1 View  Result Date: 07/31/2017 CLINICAL DATA:  Acute respiratory failure EXAM: PORTABLE CHEST 1 VIEW COMPARISON:  07/30/2017 FINDINGS: Cardiac shadow is enlarged. Postsurgical changes are again seen. Tracheostomy tube, feeding catheter and right-sided PICC line are again noted and stable. Lungs are well aerated bilaterally. Mild bibasilar atelectatic changes are seen. No bony abnormality is noted. IMPRESSION: Mild bibasilar atelectasis. Electronically Signed   By: Inez Catalina M.D.   On: 07/31/2017 07:15   Dg Chest Port 1 View  Result Date: 07/30/2017 CLINICAL DATA:  Acute respiratory failure EXAM: PORTABLE CHEST 1 VIEW COMPARISON:  07/29/2017 FINDINGS: Tracheostomy in satisfactory position. Cardiomegaly.  Prosthetic valve. No frank interstitial edema. Blunting of the left costophrenic angle, raising the possibility of a small left pleural effusion. No pneumothorax. Right arm PICC terminates at the cavoatrial junction. Enteric tube courses into the duodenum. Surgical clips overlying the neck, right chest wall/axilla, and right upper abdomen. Skin staples overlying the right lateral chest wall/axilla. IMPRESSION: Possible small left pleural effusion.  No frank interstitial edema. Tracheostomy in satisfactory position. Additional support apparatus as above. Postsurgical changes, as above. Electronically Signed   By: Julian Hy M.D.   On: 07/30/2017 09:40   STUDIES: Echo12/12/2016 LVEF 55-60%, Grade 1 DD, Mild/Mod AI with LVOT, Mid ascending aortic diameter 48.88 mm, Mild LAE, RV normal Echo (07/22/17): EF 65-70%, bioprosthetic aortic valve functioning normally, PASP 33 mmHg, RV looks ok.   CULTURES 1/24 sputum >> oral flora  ANTIBIOTICS Ceftaz1/17 >> 1.20 Cefuroxime 1/21 >> 1/23 Ceftaz 1/23 >> 1/31  EVENTS 1/14  Admit 1/14 S/p AVR and ascending AA replacement  1/14 VF  Arrest in evening with shock x 1 1/15 to OR for evacuation of hematoma and RVAD placement  1/16 PCCM consult 1/19 Lasix per cardiology,  CVP of 5 1/21 RVAD removed 1/22 heavily diuresed  1/23 ready for extubation. Atrial fib amio started.  1/24 reintubated 1/25  Afebrile 1/29  trach placed 2/01  Tolerating PMV with SLP   ASSESSMENT / PLAN: 77 year old female with PMHx significant for fusiform ascending aortic aneurysm, hypertension, hyperlipidemia, status post AVR and replacement of ascending aortic aneurysm.  On 07/10/2017 postprocedure patient developed acute cardiogenic shock had an episode of V. fib arrest was taken back to the OR on 07/11/2017 for evacuation of hematoma and placement of RVAD.  RVAD now removed, failed extubation 1/24 but continued to wean well. Underwent tracheostomy 1/29.  Course complicated by agitated delirium > improving slowly.    ASSESSMENT / PLAN:  Acute hypoxic respiratory failure - initially in setting of cardiogenic shock, currently mechanism seems to be atelectasis, shock, poor cough mechanics, deconditioning +/- HF Tracheostomy Status - placed 1/29 P: Continue ATC as tol - off vent since 2/3 am Continue diuresis per CHF team - currently off lasix  F/u CXR intermittently  PRVC intermittently if needed Mobilize, pulmonary hygiene, PT Speech therapy for speaking valve placement Brovana, Pulmicort Avoid oversedation - d/c ambien for now    HCAP/VAP, treated P:  Following off antibiotics   Cardiogenic shock - s/p removal of RVAD 1/21; sternum now closed Atrial fibrillation Medication related hypotension.  Back on levophed since started on propofol VF arrest, brief, immediately post op - no further incidence. Prolonged qT P: Per cards/CHF/CVTS  Continue Amiodarone Milrinone discontinued on 2/3 Scheduled spironolactone   Hypokalemia P: Replace potassium this morning Replace electrolytes daily as indicated  Hypothyroidism P: Continue  Synthroid  Hyperglycemia P: Scheduled Levemir Insulin per sliding scale  Constipation P: Senokot QD, hold for diarrhea Dulcolax Mobilize  Acute Encephalopathy Depression Agitated Delirium - husband states she is "never still" at home; improving P: RASS GOAL: 0 Continue scheduled clonazepam wean slowly as mental status improves Continue Seroquel 100 nightly Scheduled Effexor Haldol available as needed Will d/c ambien    FAMILY  - Updates: No family present 2/4 morning    Nickolas Madrid, NP 07/31/2017  10:03 AM Pager: [phone] or [phone]

## 2017-07-31 NOTE — Progress Notes (Addendum)
Physical Therapy Treatment Patient Details Name: [name] MRN: [id number] DOB: Apr 05, 1941 Today's Date: 07/31/2017    History of Present Illness Pt admitted 07/10/17 for AVR and ascending AA replacement. Post procedure had VF arrest. 07/11/17 back to OR to evacuate hematoma and place RVAD due to fluid overload and R systolic HF. RVAD subsequently removed. Pt intubated from 07/10/17-07/19/17. Re-intubated 1/24-1/29; trach placed 1/29. PMH: PVD, afib, vfib, hypothyroidism, anxiety and depression, HTN, HLD, R TKA.    PT Comments    Pt performed supine to sit with static sitting balance training.  Pt remains to posture to the R and required increased assistance to correct posture.  Pt transferred to chair via squat pivot and total assistance +2.  Plan for CIR pending medical progression.  Pt on 98% trach collar O2 during session.  Pt able to maintain O2 sats above 90% during session on this setting.  RR elevated to 35.  Pre tx BP-  127/79, during session sitting edge of bed - 134/80, post session- 142/86.  Pt fatigued post session.    Follow Up Recommendations  CIR(pending medical appropriateness.  )     Equipment Recommendations  (TBD at next venue)    Recommendations for Other Services Rehab consult     Precautions / Restrictions Precautions Precautions: Fall;Sternal Precaution Comments: reinforced sternal precautions during bed mobility Restrictions Weight Bearing Restrictions: Yes Other Position/Activity Restrictions: sternal precautions    Mobility  Bed Mobility Overal bed mobility: Needs Assistance       Supine to sit: Max assist;+2 for physical assistance     General bed mobility comments: Pt performed helicopter transfers to edge of bed with intiation of LE to edge of bed but ultimatley required total assistance after moving legs minimally to the edge of the bed.  Pt required use of chux pad to scoot to the edge of the bed.  Cueing and facilitation given to patient edge of  bed to correct sitting balance.    Transfers Overall transfer level: Needs assistance Equipment used: 2 person hand held assist Transfers: Sit to/from [employer] Sit to Stand: Total assist;+2 physical assistance(unable to stand fully erect with significant hip flexion and decreased upper trunk control.  )   Squat pivot transfers: Total assist;+2 physical assistance     General transfer comment: Pt performed partial sit to stand and unable to stand erect for transfer to chair.  PTA and OT opted for squat pivot to recliner chair with use of bed pad.  Chair placed close with R arm dropped to improve ease.  Once in recliner patient required total assistanc to scoot back into the recliner chair.    Ambulation/Gait Ambulation/Gait assistance: (Pt is not able standing tolerance and balance are too compromised to progress to gait at this time.  )               Stairs            Wheelchair Mobility    Modified Rankin (Stroke Patients Only)       Balance Overall balance assessment: Needs assistance   Sitting balance-Leahy Scale: Poor Sitting balance - Comments: Pt with decreased sitting balance and form.  Pt presents with lateral lean to the R with posterior pelvic tilt which causes a posterior lean.  Pt required mod to max assist +1 for sitting balance and with cueing and assistance to correct posture patient able to perform static hold unassisted for brief periods of time. Postural control: Posterior lean;Left lateral lean  Standing balance-Leahy Scale: Zero                              Cognition Arousal/Alertness: Awake/alert Behavior During Therapy: Flat affect Overall Cognitive Status: Difficult to assess Area of Impairment: Safety/judgement;Following commands;Memory                       Following Commands: Follows one step commands with increased time;Follows one step commands consistently Safety/Judgement: Decreased awareness  of safety            Exercises      General Comments        Pertinent Vitals/Pain Pain Assessment: Faces Faces Pain Scale: Hurts little more Pain Location: generalized discomfort Pain Intervention(s): Monitored during session;Repositioned    Home Living                      Prior Function            PT Goals (current goals can now be found in the care plan section) Acute Rehab PT Goals Patient Stated Goal: Unable due to trach, mouthing she is worried about her breathing Potential to Achieve Goals: Good Progress towards PT goals: Progressing toward goals    Frequency    Min 3X/week      PT Plan Current plan remains appropriate(based on medical progression)    Co-evaluation PT/OT/SLP Co-Evaluation/Treatment: Yes Reason for Co-Treatment: Complexity of the patient's impairments (multi-system involvement);Necessary to address cognition/behavior during functional activity;For patient/therapist safety;To address functional/ADL transfers PT goals addressed during session: Mobility/safety with mobility OT goals addressed during session: ADL's and self-care      AM-PAC PT "6 Clicks" Daily Activity  Outcome Measure  Difficulty turning over in bed (including adjusting bedclothes, sheets and blankets)?: Unable Difficulty moving from lying on back to sitting on the side of the bed? : Unable Difficulty sitting down on and standing up from a chair with arms (e.g., wheelchair, bedside commode, etc,.)?: Unable   Help needed walking in hospital room?: Total Help needed climbing 3-5 steps with a railing? : Total 6 Click Score: 5    End of Session Equipment Utilized During Treatment: Oxygen;Gait belt(bed pad for scooting and transfer) Activity Tolerance: Patient tolerated treatment well;Patient limited by fatigue(limited activity tolerance.  ) Patient left: with call bell/phone within reach;with nursing/sitter in room;in chair;with chair alarm set(nursing present in  room post session.  maximove lift pad placed under patient for safe transfer back to bed with nursing staff.  ) Nurse Communication: Mobility status;Need for lift equipment PT Visit Diagnosis: Other abnormalities of gait and mobility (R26.89);Muscle weakness (generalized) (M62.81)     Time: 6720-9470 PT Time Calculation (min) (ACUTE ONLY): 25 min  Charges:  $Therapeutic Activity: 8-22 mins                    G Codes:       Governor Rooks, PTA pager [phone]    Cristela Blue 07/31/2017, 1:44 PM

## 2017-07-31 NOTE — Progress Notes (Signed)
Inpatient Rehabilitation  Continuing to follow for medical readiness and post acute rehab therapy needs.  Spouse not at bedside when I went by; as a result I have called him and left a message.  Await a return call.  Will update team as I know.  Call if questions.    Carmelia Roller., CCC/SLP Admission Coordinator  [facility]  Cell [phone]

## 2017-08-01 ENCOUNTER — Inpatient Hospital Stay (HOSPITAL_COMMUNITY): Payer: Medicare Other

## 2017-08-01 DIAGNOSIS — Z930 Tracheostomy status: Secondary | ICD-10-CM

## 2017-08-01 LAB — GLUCOSE, CAPILLARY
Glucose-Capillary: 117 mg/dL — ABNORMAL HIGH (ref 65–99)
Glucose-Capillary: 82 mg/dL (ref 65–99)
Glucose-Capillary: 112 mg/dL — ABNORMAL HIGH (ref 65–99)
Glucose-Capillary: 74 mg/dL (ref 65–99)
Glucose-Capillary: 128 mg/dL — ABNORMAL HIGH (ref 65–99)
Glucose-Capillary: 134 mg/dL — ABNORMAL HIGH (ref 65–99)
Glucose-Capillary: 61 mg/dL — ABNORMAL LOW (ref 65–99)

## 2017-08-01 LAB — POCT I-STAT, CHEM 8
BUN: 31 mg/dL — ABNORMAL HIGH (ref 6–20)
Calcium, Ion: 0.98 mmol/L — ABNORMAL LOW (ref 1.15–1.40)
Chloride: 104 mmol/L (ref 101–111)
Creatinine, Ser: 0.5 mg/dL (ref 0.44–1.00)
Glucose, Bld: 121 mg/dL — ABNORMAL HIGH (ref 65–99)
HCT: 40 % (ref 36.0–46.0)
Hemoglobin: 13.6 g/dL (ref 12.0–15.0)
Potassium: 3.2 mmol/L — ABNORMAL LOW (ref 3.5–5.1)
Sodium: 149 mmol/L — ABNORMAL HIGH (ref 135–145)
TCO2: 32 mmol/L (ref 22–32)

## 2017-08-01 LAB — CBC
HCT: 41.9 % (ref 36.0–46.0)
Hemoglobin: 12.2 g/dL (ref 12.0–15.0)
MCH: 30 pg (ref 26.0–34.0)
MCHC: 29.1 g/dL — ABNORMAL LOW (ref 30.0–36.0)
MCV: 102.9 fL — ABNORMAL HIGH (ref 78.0–100.0)
Platelets: 453 10*3/uL — ABNORMAL HIGH (ref 150–400)
RBC: 4.07 MIL/uL (ref 3.87–5.11)
RDW: 17.7 % — ABNORMAL HIGH (ref 11.5–15.5)
WBC: 10 10*3/uL (ref 4.0–10.5)

## 2017-08-01 LAB — BASIC METABOLIC PANEL
ANION GAP: 13 (ref 5–15)
BUN: 32 mg/dL — ABNORMAL HIGH (ref 6–20)
CALCIUM: 8.2 mg/dL — AB (ref 8.9–10.3)
CO2: 31 mmol/L (ref 22–32)
Chloride: 104 mmol/L (ref 101–111)
Creatinine, Ser: 0.62 mg/dL (ref 0.44–1.00)
GFR calc Af Amer: >60 mL/min (ref >60)
Glucose, Bld: 124 mg/dL — ABNORMAL HIGH (ref 65–99)
POTASSIUM: 3.8 mmol/L (ref 3.5–5.1)
SODIUM: 148 mmol/L — AB (ref 135–145)

## 2017-08-01 MED ORDER — POTASSIUM CHLORIDE 10 MEQ/50ML IV SOLN
10.0000 meq | INTRAVENOUS | Status: AC | PRN
  Administered 2017-08-01 (×3): 10 meq via INTRAVENOUS
  Filled 2017-08-01 (×2): qty 50

## 2017-08-01 MED ORDER — LEVALBUTEROL HCL 0.63 MG/3ML IN NEBU
0.6300 mg | INHALATION_SOLUTION | Freq: Two times a day (BID) | RESPIRATORY_TRACT | Status: DC
  Administered 2017-08-01 – 2017-08-12 (×22): 0.63 mg via RESPIRATORY_TRACT
  Filled 2017-08-01 (×22): qty 3

## 2017-08-01 MED ORDER — CLONAZEPAM 0.5 MG PO TBDP: 0.5000 mg | ORAL_TABLET | Freq: Every day | ORAL | Status: DC

## 2017-08-01 MED ORDER — QUETIAPINE FUMARATE 50 MG PO TABS
50.0000 mg | ORAL_TABLET | Freq: Every day | ORAL | Status: DC
  Administered 2017-08-01: 50 mg via ORAL
  Filled 2017-08-01: qty 1

## 2017-08-01 MED ORDER — DEXTROSE 50 % IV SOLN
25.0000 mL | Freq: Once | INTRAVENOUS | Status: AC
  Administered 2017-08-01: 25 mL via INTRAVENOUS
  Filled 2017-08-01: qty 50

## 2017-08-01 MED ORDER — LEVALBUTEROL HCL 0.63 MG/3ML IN NEBU: 0.6300 mg | INHALATION_SOLUTION | Freq: Four times a day (QID) | RESPIRATORY_TRACT | Status: DC | PRN

## 2017-08-01 MED ORDER — FUROSEMIDE 40 MG PO TABS
60.0000 mg | ORAL_TABLET | Freq: Every day | ORAL | Status: DC
  Administered 2017-08-01 – 2017-08-09 (×8): 60 mg via ORAL
  Filled 2017-08-01 (×9): qty 1

## 2017-08-01 NOTE — Progress Notes (Signed)
Patient ID: [name], female   DOB: Mar 14, 1941, 77 y.o.   MRN: [id number]     Advanced Heart Failure Rounding Note  Primary Cardiologist: Dr. Percival Spanish (2014) HF: (New) Dr. Haroldine Laws   Subjective:    Events - 07/10/17 S/p AVR and ascending AA replacement  - 07/10/17 VF Arrest in evening with shock x 1 - 07/11/17 RVAD placement - 07/17/2017 RVAD removed with chest closed -1/23 Extubated. Went in A fib. Amio started.  -1/24 Reintubated.  -1/29 Trach  Remains off vent on 40% trach collar.   Awake follows commands. Denies pain.    Echo (07/22/17): EF 65-70%, bioprosthetic aortic valve functioning normally, PASP 33 mmHg, RV looks ok.   Objective:   Weight Range: 177 lb 11.1 oz (80.6 kg) Body mass index is 29.57 kg/m.   Vital Signs:   Temp:  [97.6 F (36.4 C)-98.4 F (36.9 C)] 97.6 F (36.4 C) (02/05 0743) Pulse Rate:  [87-96] 91 (02/05 0722) Resp:  [20-31] 24 (02/05 0722) BP: (99-148)/(58-91) 129/78 (02/05 0722) SpO2:  [90 %-96 %] 94 % (02/05 0722) FiO2 (%):  [60 %] 60 % (02/05 0723) Weight:  [177 lb 11.1 oz (80.6 kg)] 177 lb 11.1 oz (80.6 kg) (02/05 0349) Last BM Date: 07/31/17  Weight change: Filed Weights   07/30/17 0500 07/31/17 0439 08/01/17 0349  Weight: 187 lb 9.8 oz (85.1 kg) 179 lb 7.3 oz (81.4 kg) 177 lb 11.1 oz (80.6 kg)    Intake/Output:   Intake/Output Summary (Last 24 hours) at 08/01/2017 0823 Last data filed at 08/01/2017 0600 Gross per 24 hour  Intake 880 ml  Output 300 ml  Net 580 ml    Physical Exam  CVP 11-12.  General:   No resp difficulty. In bed.  HEENT: normal Neck: supple. JVP to jaw Carotids 2+ bilat; no bruits. + trach Cor: PMI nondisplaced. Regular rate & rhythm. No rubs, gallops or murmurs. Lungs: coarse throughout.  Abdomen: soft, nontender, nondistended. No hepatosplenomegaly. No bruits or masses. Good bowel sounds. Extremities: no cyanosis, clubbing, rash, R and LLE SCDs. Edema. RUE PICC Neuro: trach . Follows commands. MAE.    Telemetry  NSR 90s personally reviewed.  EKG    No new tracings.  Labs    CBC Recent Labs    07/31/17 0427 07/31/17 1709 08/01/17 0339  WBC 9.0  --  10.0  HGB 11.8* 13.9 12.2  HCT 40.4 41.0 41.9  MCV 101.8*  --  102.9*  PLT 471*  --  419*   Basic Metabolic Panel Recent Labs    07/30/17 0142  07/31/17 0427 07/31/17 1709 08/01/17 0339  NA 144   < > 147* 148* 148*  K 3.2*   < > 3.8 3.8 3.8  CL 97*   < > 103 103 104  CO2 34*  --  33*  --  31  GLUCOSE 124*   < > 132* 110* 124*  BUN 26*   < > 31* 31* 32*  CREATININE 0.60   < > 0.67 0.50 0.62  CALCIUM 7.7*  --  8.0*  --  8.2*  MG 2.0  --   --   --   --    < > = values in this interval not displayed.   Liver Function Tests No results for input(s): AST, ALT, ALKPHOS, BILITOT, PROT, ALBUMIN in the last 72 hours. No results for input(s): LIPASE, AMYLASE in the last 72 hours. Cardiac Enzymes No results for input(s): CKTOTAL, CKMB, CKMBINDEX, TROPONINI in the last 72  hours.  BNP: BNP (last 3 results) No results for input(s): BNP in the last 8760 hours.  ProBNP (last 3 results) No results for input(s): PROBNP in the last 8760 hours.   D-Dimer No results for input(s): DDIMER in the last 72 hours. Hemoglobin A1C No results for input(s): HGBA1C in the last 72 hours. Fasting Lipid Panel No results for input(s): CHOL, HDL, LDLCALC, TRIG, CHOLHDL, LDLDIRECT in the last 72 hours. Thyroid Function Tests No results for input(s): TSH, T4TOTAL, T3FREE, THYROIDAB in the last 72 hours.  Invalid input(s): FREET3  Other results:   Imaging    Dg Chest Portable 1 View  Result Date: 08/01/2017 CLINICAL DATA:  Respiratory failure, history of bronchitis, CHF, pneumonia. EXAM: PORTABLE CHEST 1 VIEW COMPARISON:  Portable chest x-ray of July 31, 2017 FINDINGS: The lung volumes remain low. The tracheostomy tube tip projects at the level of the inferior margin of the clavicular heads. The interstitial markings are mildly  prominent. The left lower lobe is slightly less dense today and the left hemidiaphragm better visualized. The cardiac silhouette remains enlarged. The pulmonary vascularity is not clearly engorged. The feeding tube tip projects below the inferior margin of the image. The right sided PICC line tip projects over the distal third of the SVC. IMPRESSION: Slight interval improvement in left lower lobe atelectasis or pneumonia. Stable hypoinflation. Stable cardiomegaly without significant pulmonary edema. Electronically Signed   By: David  Martinique M.D.   On: 08/01/2017 07:21    Medications:    Scheduled Medications: . amiodarone  200 mg Oral BID  . arformoterol  15 mcg Nebulization BID  . aspirin EC  325 mg Oral Daily   Or  . aspirin  324 mg Per Tube Daily  . bisacodyl  10 mg Oral Daily   Or  . bisacodyl  10 mg Rectal Daily  . budesonide (PULMICORT) nebulizer solution  0.5 mg Nebulization BID  . chlorhexidine gluconate (MEDLINE KIT)  15 mL Mouth Rinse BID  . Chlorhexidine Gluconate Cloth  6 each Topical Daily  . clonazepam  1 mg Oral BID  . enoxaparin (LOVENOX) injection  40 mg Subcutaneous Q24H  . feeding supplement (PRO-STAT SUGAR FREE 64)  30 mL Per Tube TID  . Gerhardt's butt cream   Topical BID  . insulin aspart  0-24 Units Subcutaneous Q4H  . insulin detemir  10 Units Subcutaneous BID  . latanoprost  1 drop Both Eyes QHS  . levalbuterol  1.25 mg Nebulization Q6H  . levothyroxine  100 mcg Oral QAC breakfast  . mouth rinse  15 mL Mouth Rinse QID  . naphazoline-glycerin  1-2 drop Both Eyes BID  . pantoprazole (PROTONIX) IV  40 mg Intravenous Q12H  . QUEtiapine  100 mg Oral QHS  . senna-docusate  2 tablet Oral Daily  . sodium chloride flush  10-40 mL Intracatheter Q12H  . spironolactone  25 mg Oral Daily  . venlafaxine XR  75 mg Oral Daily    Infusions: . sodium chloride 250 mL (07/22/17 1820)  . feeding supplement (VITAL 1.5 CAL) 1,000 mL (08/01/17 0600)  . lactated ringers Stopped  (07/17/17 1200)    PRN Medications: haloperidol lactate, metoprolol tartrate, ondansetron (ZOFRAN) IV  Patient Profile   [name] is a 77 y.o. female with history of fusiform ascending aortic aneurysm, HTN, and HLD.   Admitted 07/10/17 for planned AVR and replacement of ascending aortic aneurysm. Developed acute cardiogenic shock + VF arrest. Taken back to OR am of 07/11/17 for evacuation  of hematoma and placement of RVAD.   Assessment/Plan   1. Cardiogenic shock s/p cardiac surgery with R>L CHF:  Pre-op Echo 06/02/2017 LVEF 55-60%, Grade 1 DD, Mild/Mod AI with LVOT, Mid ascending aortic diameter 48.88 mm, Mild LAE, RV normal.  Post-op shock, RVAD placed now s/p RVAD removal 07/17/2017.  Echo 1/26 with EF 65-70%, bioprosthetic aortic valve, improved RV function.  - Volume status trending up. CVP 11-12. Start lasix 60 mg po daily.   - Continue 25 mg spironolactone daily.  -  RV function looked better on echo 07/22/16.  2. VF arrest:  - Rhythm currently stable on po amio  - Goal K > 4.0 and Mg > 2.0 - Check Mag in am.  3. Post Op Atrial fibrillation:  -- Only short run post op. So no AC for now. - Maintaining NSR.   - Currently only getting lovenox --->DVT prophylaxis. 4. Acute respiratory failure: Reintubated 1/24.   - S/P Trach 1/29.  - Remains on trach collar 40%.  - Has completed ceftazidime per CCM. Still with copious  secretions. Recultured yesterday. - Diurese as tolerated  5. S/p AVR and Ascending aortic aneurysm replacement. - Post-op echo stable. 6. AKI:  - Resolved 7. Anemia:  - stable 12.2.   - s/p 1uPRBCs 07/23/17. 8. Hypokalemia - K 3.8.  - Continue to follow.   .  Length of Stay: [place], NP  8:23 AM Advanced Heart Failure Team Pager [phone] (M-F; 7a - 4p)  Please contact [facility] for night-coverage after hours (4p -7a ) and weekends on [email or web address]  Patient seen and examined with Darrick Grinder, NP. We discussed all aspects of the encounter. I  agree with the assessment and plan as stated above.   Stable from cardiac standpoint. Main issue is respiratory status. Still with copious secretions from trach. CXR with slight improvement LLL infiltrate (Personally reviewed). CVP trending up. Will restart po lasix. Continue pulmonary toilet. Hemodynamics stable. Await culture.   Will likely need LTAC for slow recovery.   Glori Bickers, MD  8:40 AM

## 2017-08-01 NOTE — Progress Notes (Signed)
..  Name: [name] MRN: [id number] DOB: October 08, 1940    ADMISSION DATE:  07/10/2017  CONSULTATION DATE:  07/12/17  REFERRING MD :  Nils Pyle MD   BRIEF:   77 year old female with past medical history significant for fusiform ascending aortic aneurysm, hypertension, hyperlipidemia, status post AVR and replacement of ascending aortic aneurysm.  On 07/10/2017 postprocedure patient developed acute cardiogenic shock had an episode of V. fib arrest was taken back to the OR on 07/11/2017 for evacuation of hematoma and placement of RVAD. She had prolonged mechanical ventilation requiring tracheostomy.   SUBJECTIVE:   Off vent x 48 hours.  Afebrile Was oob with PT yesterday  VITAL SIGNS: Temp:  [97.6 F (36.4 C)-98.4 F (36.9 C)] 97.6 F (36.4 C) (02/05 0743) Pulse Rate:  [88-96] 89 (02/05 0800) Resp:  [20-31] 21 (02/05 0800) BP: (99-148)/(58-91) 133/71 (02/05 0800) SpO2:  [90 %-96 %] 94 % (02/05 0800) FiO2 (%):  [60 %] 60 % (02/05 0723) Weight:  [177 lb 11.1 oz (80.6 kg)] 177 lb 11.1 oz (80.6 kg) (02/05 0349)   Filed Weights   07/30/17 0500 07/31/17 0439 08/01/17 0349  Weight: 187 lb 9.8 oz (85.1 kg) 179 lb 7.3 oz (81.4 kg) 177 lb 11.1 oz (80.6 kg)    Intake/Output Summary (Last 24 hours) at 08/01/2017 1036 Last data filed at 08/01/2017 0800 Gross per 24 hour  Intake 1100 ml  Output 450 ml  Net 650 ml     PHYSICAL EXAMINATION: General: chr Ill-appearing woman, NAD on ATC  HEENT: Trach midline, c/d Neuro: awake, lethargic,  follows commands  CV: Regular, distant, no murmur, midline surgical scar, right shoulder staples PULM: resps even non labored on ATC, decreasedbilateral breath sounds GI: Soft, nontender, positive bowel sounds Extremities: scant BLE edema  Skin: No rash  PULMONARY  CBC Recent Labs  Lab 07/30/17 0703  07/31/17 0427 07/31/17 1709 08/01/17 0339  HGB 11.9*   < > 11.8* 13.9 12.2  HCT 39.5   < > 40.4 41.0 41.9  WBC 9.6  --  9.0  --  10.0  PLT 570*   --  471*  --  453*   < > = values in this interval not displayed.    COAGULATION No results for input(s): INR in the last 168 hours.  CARDIAC  No results for input(s): TROPONINI in the last 168 hours. No results for input(s): PROBNP in the last 168 hours.   CHEMISTRY Recent Labs  Lab 07/26/17 0341  07/27/17 0320  07/28/17 5053  07/29/17 0017 07/29/17 0305  07/30/17 0142 07/30/17 1755 07/30/17 1804 07/31/17 0427 07/31/17 1709 08/01/17 0339  NA 135   < > 137   < > 140   < > 140 141   < > 144 149* 147* 147* 148* 148*  K 2.6*   < > 3.1*   < > 4.1   < > 3.8 3.2*   < > 3.2* 2.9* 3.7 3.8 3.8 3.8  CL 96*   < > 95*   < > 96*   < > 94* 95*   < > 97* 105 98* 103 103 104  CO2 27   < > 30  --  31  --  33* 33*  --  34*  --   --  33*  --  31  GLUCOSE 115*   < > 134*   < > 132*   < > 137* 83   < > 124* 92 111* 132* 110* 124*  BUN  22*   < > 28*   < > 28*   < > 34* 32*   < > 26* 25* 29* 31* 31* 32*  CREATININE 0.62   < > 0.63   < > 0.63   < > 0.59 0.59   < > 0.60 0.40* 0.60 0.67 0.50 0.62  CALCIUM 6.7*   < > 6.7*  --  7.1*  --  7.3* 7.4*  --  7.7*  --   --  8.0*  --  8.2*  MG 1.9  --  2.1  --  1.8  --   --   --   --  2.0  --   --   --   --   --   PHOS 3.1  --  3.3  --  2.0*  --   --   --   --   --   --   --   --   --   --    < > = values in this interval not displayed.   Estimated Creatinine Clearance: 63.7 mL/min (by C-G formula based on SCr of 0.62 mg/dL).   LIVER Recent Labs  Lab 07/26/17 0341 07/27/17 0320 07/28/17 0521 07/29/17 0305  AST 50* 66* 70* 104*  ALT 33 39 45 61*  ALKPHOS 165* 171* 182* 178*  BILITOT 1.3* 1.1 1.5* 1.2  PROT 5.1* 5.5* 5.8* 5.9*  ALBUMIN 2.8* 2.8* 2.9* 2.9*    INFECTIOUS Recent Labs  Lab 07/26/17 0341 07/27/17 0320  PROCALCITON 0.52 1.14    ENDOCRINE CBG (last 3)  Recent Labs    07/31/17 2332 08/01/17 0346 08/01/17 0741  GLUCAP 117* 82 112*     IMAGING  Dg Chest Portable 1 View  Result Date: 08/01/2017 CLINICAL DATA:   Respiratory failure, history of bronchitis, CHF, pneumonia. EXAM: PORTABLE CHEST 1 VIEW COMPARISON:  Portable chest x-ray of July 31, 2017 FINDINGS: The lung volumes remain low. The tracheostomy tube tip projects at the level of the inferior margin of the clavicular heads. The interstitial markings are mildly prominent. The left lower lobe is slightly less dense today and the left hemidiaphragm better visualized. The cardiac silhouette remains enlarged. The pulmonary vascularity is not clearly engorged. The feeding tube tip projects below the inferior margin of the image. The right sided PICC line tip projects over the distal third of the SVC. IMPRESSION: Slight interval improvement in left lower lobe atelectasis or pneumonia. Stable hypoinflation. Stable cardiomegaly without significant pulmonary edema. Electronically Signed   By: David  Martinique M.D.   On: 08/01/2017 07:21   Dg Chest Port 1 View  Result Date: 07/31/2017 CLINICAL DATA:  Acute respiratory failure EXAM: PORTABLE CHEST 1 VIEW COMPARISON:  07/30/2017 FINDINGS: Cardiac shadow is enlarged. Postsurgical changes are again seen. Tracheostomy tube, feeding catheter and right-sided PICC line are again noted and stable. Lungs are well aerated bilaterally. Mild bibasilar atelectatic changes are seen. No bony abnormality is noted. IMPRESSION: Mild bibasilar atelectasis. Electronically Signed   By: Inez Catalina M.D.   On: 07/31/2017 07:15   STUDIES: Echo12/12/2016 LVEF 55-60%, Grade 1 DD, Mild/Mod AI with LVOT, Mid ascending aortic diameter 48.88 mm, Mild LAE, RV normal Echo (07/22/17): EF 65-70%, bioprosthetic aortic valve functioning normally, PASP 33 mmHg, RV looks ok.   CULTURES 1/24 sputum >> oral flora  ANTIBIOTICS Ceftaz1/17 >> 1.20 Cefuroxime 1/21 >> 1/23 Ceftaz 1/23 >> 1/31  EVENTS 1/14  Admit 1/14 S/p AVR and ascending AA replacement  1/14 VF Arrest in evening with  shock x 1 1/15 to OR for evacuation of hematoma and RVAD placement    1/16 PCCM consult 1/19 Lasix per cardiology,  CVP of 5 1/21 RVAD removed 1/22 heavily diuresed  1/23 ready for extubation. Atrial fib amio started.  1/24 reintubated 1/25  Afebrile 1/29  trach placed 2/01  Tolerating PMV with SLP   ASSESSMENT / PLAN: 77 year old female with PMHx significant for fusiform ascending aortic aneurysm, hypertension, hyperlipidemia, status post AVR and replacement of ascending aortic aneurysm.  On 07/10/2017 postprocedure patient developed acute cardiogenic shock had an episode of V. fib arrest was taken back to the OR on 07/11/2017 for evacuation of hematoma and placement of RVAD.  RVAD now removed, failed extubation 1/24 but continued to wean well. Underwent tracheostomy 1/29.  Course complicated by agitated delirium > improving slowly.    ASSESSMENT / PLAN:  Acute hypoxic respiratory failure - initially in setting of cardiogenic shock, resolving Tracheostomy Status - 1/29 P: Continue ATC as tol - off vent since 2/3 am Mobilize, pulmonary hygiene, PT PM valve as tolerated Brovana, Pulmicort    HCAP/VAP, treated P:  Following off antibiotics   Cardiogenic shock - s/p removal of RVAD 1/21; sternum now closed Atrial fibrillation Medication related hypotension.  Back on levophed since started on propofol VF arrest, brief, immediately post op - no further incidence. Prolonged qT P: Per cards/CHF/CVTS  Continue Amiodarone Milrinone discontinued on 2/3 Scheduled spironolactone   Hypokalemia P: Replace electrolytes daily as indicated  Hypothyroidism P: Continue Synthroid  Hyperglycemia P: Scheduled Levemir Insulin per sliding scale  Constipation P: Senokot QD, hold for diarrhea Dulcolax Mobilize  Acute Encephalopathy Depression Agitated Delirium -  improving P: RASS GOAL: 0 Drop  clonazepam 0.5 q hs Drop Seroquel 50 nightly Scheduled Effexor Haldol available as needed NO ambien   Dispo- CIR vs LTAC  Kara Mead MD.  [facility]. [facility] Pager [phone] If no response call [phone]    08/01/2017  10:36 AM

## 2017-08-01 NOTE — Progress Notes (Signed)
Physical Therapy Treatment Patient Details Name: [name] MRN: [id number] DOB: [birth year] Today's Date: 08/01/2017    History of Present Illness Pt admitted 07/10/17 for AVR and ascending AA replacement. Post procedure had VF arrest. 07/11/17 back to OR to evacuate hematoma and place RVAD due to fluid overload and R systolic HF. RVAD subsequently removed. Pt intubated from 07/10/17-07/19/17. Re-intubated 1/24-1/29; trach placed 1/29. PMH: PVD, afib, vfib, hypothyroidism, anxiety and depression, HTN, HLD, R TKA.    PT Comments    Pt performed supine to sit and sat edge of bed to perform trunk control and minimal reaching activities.  Pt remains deconditioned from illness during this hopsitalization but posture improved and lateral lean to the R not present in sitting.  We remains hopeful that medical presentation will continue to improve and patient will be appropriate for CIR therapies.  Pt remains motivated and agreeable to session this pm.    Follow Up Recommendations  CIR(pending medical appropriateness.  )     Equipment Recommendations  (TBD at next venue)    Recommendations for Other Services Rehab consult     Precautions / Restrictions Precautions Precautions: Fall;Sternal Precaution Comments: reinforced sternal precautions during bed mobility Restrictions Weight Bearing Restrictions: No Other Position/Activity Restrictions: sternal precautions    Mobility  Bed Mobility Overal bed mobility: Needs Assistance Bed Mobility: Rolling;Supine to Sit;Sit to Supine Rolling: Max assist;Total assist   Supine to sit: Max assist;+2 for physical assistance Sit to supine: Total assist;+2 for physical assistance   General bed mobility comments: Cues for hand placement to comply with sternal precautions.  Pt with attempt and active participation to move LEs to edge of bed.  Use of bed pad to scoot to edge and assistance required for trunk elevation.   Transfers Overall transfer level:  Needs assistance Equipment used: 2 person hand held assist Transfers: Sit to/from Stand Sit to Stand: Total assist;+2 physical assistance(Pt remains unable to stand erect with active hip flexion and forward trunk, decreased trunk extension observed.  )         General transfer comment: Pt performed partial sit to stand x2 trials to remove sioiled bed pads from patient's bottom.  Deferred transfer to the chair due to lethargy.  Pt required facilitation to correct hip flexion and trunk flexion into extension.    Ambulation/Gait Ambulation/Gait assistance: (Pt remains unable.  )               Stairs            Wheelchair Mobility    Modified Rankin (Stroke Patients Only)       Balance     Sitting balance-Leahy Scale: Poor Sitting balance - Comments: Pt required intermittent min to mod assist to maintain sitting.  Pt performed limited reaching activity with hand over hand assistance to midline of body.   Postural control: Posterior lean;Right lateral lean   Standing balance-Leahy Scale: Zero Standing balance comment: unable to achieve upright posture                            Cognition Arousal/Alertness: Lethargic Behavior During Therapy: Flat affect;Restless Overall Cognitive Status: Difficult to assess Area of Impairment: Safety/judgement;Following commands;Memory                   Current Attention Level: Sustained Memory: Decreased short-term memory Following Commands: Follows one step commands with increased time;Follows one step commands consistently Safety/Judgement: Decreased awareness of safety  General Comments: Pt more receptive to commands but continues to fatigue easily.        Exercises      General Comments        Pertinent Vitals/Pain Pain Assessment: Faces Faces Pain Scale: Hurts even more Pain Location: generalized discomfort Pain Descriptors / Indicators: Grimacing;Guarding Pain Intervention(s): Monitored  during session;Repositioned    Home Living                      Prior Function            PT Goals (current goals can now be found in the care plan section) Acute Rehab PT Goals Patient Stated Goal: to sit up Potential to Achieve Goals: Good Progress towards PT goals: Progressing toward goals    Frequency    Min 3X/week      PT Plan Current plan remains appropriate(based on medical presentation.  )    Co-evaluation              AM-PAC PT "6 Clicks" Daily Activity  Outcome Measure  Difficulty turning over in bed (including adjusting bedclothes, sheets and blankets)?: Unable Difficulty moving from lying on back to sitting on the side of the bed? : Unable Difficulty sitting down on and standing up from a chair with arms (e.g., wheelchair, bedside commode, etc,.)?: Unable Help needed moving to and from a bed to chair (including a wheelchair)?: Total Help needed walking in hospital room?: Total Help needed climbing 3-5 steps with a railing? : Total 6 Click Score: 6    End of Session Equipment Utilized During Treatment: Oxygen;Gait belt Activity Tolerance: Patient tolerated treatment well;Patient limited by fatigue Patient left: in bed;with nursing/sitter in room;with family/visitor present;with call bell/phone within reach Nurse Communication: Mobility status;Need for lift equipment PT Visit Diagnosis: Other abnormalities of gait and mobility (R26.89);Muscle weakness (generalized) (M62.81)     Time: 1208-1229 PT Time Calculation (min) (ACUTE ONLY): 21 min  Charges:  $Therapeutic Activity: 8-22 mins                    G Codes:       Governor Rooks, PTA pager [phone]    Cristela Blue 08/01/2017, 12:56 PM

## 2017-08-01 NOTE — Progress Notes (Signed)
[facility] BRIEF SICU PROGRESS NOTE  15 Days Post-Op  S/P Procedure(s) (LRB): REMOVAL OF RVAD WITH PUMP STANDBY (N/A) TRANSESOPHAGEAL ECHOCARDIOGRAM (TEE) (N/A)   Stable day  Plan: Continue current plan  Rexene Alberts, MD 08/01/2017 7:21 PM

## 2017-08-01 NOTE — Progress Notes (Signed)
  Speech Language Pathology Treatment: [name] Speaking valve  Patient Details Name: [name] MRN: [id number] DOB: [birth year] Today's Date: 08/01/2017 Time: 9021-1155 SLP Time Calculation (min) (ACUTE ONLY): 13 min  Assessment / Plan / Recommendation Clinical Impression  Pt awake today but groggy. She is on 40% O2 with RR fluctuating at rest. Cuff typically deflated however mildly inflated, RN suctioned (previously called by sitter) and cuff fully deflated. Decreased intensity with PMV due to increase respiratory support required with verbal cues for deep inhalation before verbalizations. HR 93, SpO2 94% and RR 28-33. Making progress toward goals although she is fatigued today therefore session shorter. Recommend pt wear valve during therapies with full supervision and remove if RR exceeds 33. Goal is increased duration with valve and swallow assessment when appropriate.      HPI HPI: Pt admitted 07/10/17 for AVR and ascending AA replacement. Post procedure had VF arrest. 07/11/17 back to OR to evacuate hematoma and place RVAD due to fluid overload and R systolic HF. RVAD subsequently removed. Pt intubated from 07/10/17-07/19/17. Re-intubated 1/24-1/29; trach placed 1/29. PMH: PVD, afib, vfib, hypothyroidism, anxiety and depression, HTN, HLD, R TKA.      SLP Plan  Continue with current plan of care       Recommendations         Patient may use Passy-Muir Speech Valve: During all therapies with supervision PMSV Supervision: Full MD: Please consider changing trach tube to : Cuffless         Oral Care Recommendations: Oral care BID Follow up Recommendations: (?) SLP Visit Diagnosis: Aphonia (R49.1) Plan: Continue with current plan of care                       [name] 08/01/2017, 1:51 PM  [name] M.Ed [employer] [phone]

## 2017-08-02 LAB — GLUCOSE, CAPILLARY
Glucose-Capillary: 105 mg/dL — ABNORMAL HIGH (ref 65–99)
Glucose-Capillary: 105 mg/dL — ABNORMAL HIGH (ref 65–99)
Glucose-Capillary: 108 mg/dL — ABNORMAL HIGH (ref 65–99)
Glucose-Capillary: 114 mg/dL — ABNORMAL HIGH (ref 65–99)
Glucose-Capillary: 116 mg/dL — ABNORMAL HIGH (ref 65–99)
Glucose-Capillary: 128 mg/dL — ABNORMAL HIGH (ref 65–99)
Glucose-Capillary: 137 mg/dL — ABNORMAL HIGH (ref 65–99)

## 2017-08-02 LAB — BLOOD GAS, ARTERIAL

## 2017-08-02 MED ORDER — VANCOMYCIN HCL IN DEXTROSE 750-5 MG/150ML-% IV SOLN
750.0000 mg | Freq: Two times a day (BID) | INTRAVENOUS | Status: DC
  Administered 2017-08-02 – 2017-08-05 (×7): 750 mg via INTRAVENOUS
  Filled 2017-08-02 (×7): qty 150

## 2017-08-02 MED ORDER — ACETAMINOPHEN 160 MG/5ML PO SOLN
650.0000 mg | Freq: Four times a day (QID) | ORAL | Status: DC | PRN
  Administered 2017-08-02 – 2017-08-10 (×6): 650 mg
  Filled 2017-08-02 (×6): qty 20.3

## 2017-08-02 MED ORDER — CLONAZEPAM 0.1 MG/ML ORAL SUSPENSION: 0.2500 mg | Freq: Every day | ORAL | Status: DC

## 2017-08-02 MED ORDER — CLONAZEPAM 0.125 MG PO TBDP
0.2500 mg | ORAL_TABLET | Freq: Every day | ORAL | Status: DC
  Administered 2017-08-02 – 2017-08-04 (×3): 0.25 mg
  Filled 2017-08-02 (×3): qty 2

## 2017-08-02 MED ORDER — CLONAZEPAM 0.25 MG PO TBDP
1.0000 mg | ORAL_TABLET | Freq: Every day | ORAL | Status: DC
  Administered 2017-08-02 – 2017-08-11 (×10): 1 mg via ORAL
  Filled 2017-08-02: qty 2
  Filled 2017-08-02: qty 4
  Filled 2017-08-02 (×8): qty 2

## 2017-08-02 MED ORDER — QUETIAPINE FUMARATE 50 MG PO TABS
75.0000 mg | ORAL_TABLET | Freq: Every day | ORAL | Status: DC
  Administered 2017-08-02 – 2017-08-11 (×10): 75 mg via ORAL
  Filled 2017-08-02 (×5): qty 1
  Filled 2017-08-02: qty 2
  Filled 2017-08-02 (×4): qty 1

## 2017-08-02 MED ORDER — POTASSIUM CHLORIDE 20 MEQ/15ML (10%) PO SOLN
40.0000 meq | ORAL | Status: AC
  Administered 2017-08-02 (×2): 40 meq
  Filled 2017-08-02 (×2): qty 30

## 2017-08-02 MED ORDER — HALOPERIDOL LACTATE 5 MG/ML IJ SOLN: 2.0000 mg | Freq: Every evening | INTRAMUSCULAR | Status: DC | PRN

## 2017-08-02 NOTE — Progress Notes (Signed)
Nutrition Follow-up  INTERVENTION:   Continue: Vital 1.5 @ 40 ml/hr (960 ml/day) 30 ml Prostat TID  Provides: 1740 kcal, 109 grams protein, and 733 ml free water.    NUTRITION DIAGNOSIS:   Inadequate oral intake related to inability to eat as evidenced by NPO status. Ongoing.   GOAL:   Patient will meet greater than or equal to 90% of their needs Met.   MONITOR:   Vent status, I & O's, TF tolerance  ASSESSMENT:   Pt with PMH of HTN, HLD, B12 deficiency admitted 1/14 for planned AVR and replacement of ascending aortic aneurysm. Pt developed acute cardiogenic shock and VF arrest. OR 1/15 for evac of hematoma and placement of RVAD/centromag.   Spoke with RN who reports plan for pt is CIR vs LTACH.  Per RN pt agitated and MD adjusting mediations and trying to get on a sleep schedule. Pt did have constipation and was given medications and now has diarrhea.   Pt has been off vent since 2/3 SLP working with pt for PMV, no swallow eval yet   Medications reviewed and include: dulcolax (held), lasix, SSI, levemir, synthroid, senokot-s Labs reviewed: Na 149 (H), K+ 3.2 (L) CBG's: 105-105-128 Pt is negative 14.9 L since admission. Weight had been as high as 205 lb with admission weight of 168 lb. Pt remains on lasix.   1/14 S/p AVR and ascending AA replacement  1/14 VF Arrest in evening with shock x 1 1/15 to OR for evacuation of hematoma and RVAD placement 1/21 RVAD removed, sternum closed 1/29 trach   Diet Order:  Diet NPO time specified  EDUCATION NEEDS:   No education needs have been identified at this time  Skin:  Skin Assessment: Reviewed RN Assessment(MASD abdomen)  Last BM:  2/6 large type 7  Height:   Ht Readings from Last 1 Encounters:  07/27/17 5' 5"  (1.651 m)    Weight:   Wt Readings from Last 1 Encounters:  08/02/17 176 lb 9.4 oz (80.1 kg)    Ideal Body Weight:  56.8 kg  BMI:  Body mass index is 29.39 kg/m.  Estimated Nutritional Needs:    Kcal:  1600-1800  Protein:  110-120 grams  Fluid:  > 1.5 L/day   Maylon Peppers RD, LDN, CNSC [phone] Pager [phone] After Hours Pager
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
## 2017-08-02 NOTE — Progress Notes (Signed)
..  Name: [name] MRN: [id number] DOB: [birth year]    ADMISSION DATE:  07/10/2017  CONSULTATION DATE:  07/12/17  REFERRING MD :  Nils Pyle MD   BRIEF:   77 year old female with past medical history significant for fusiform ascending aortic aneurysm, hypertension, hyperlipidemia, status post AVR and replacement of ascending aortic aneurysm.  On 07/10/2017 postprocedure patient developed acute cardiogenic shock had an episode of V. fib arrest was taken back to the OR on 07/11/2017 for evacuation of hematoma and placement of RVAD. She had prolonged mechanical ventilation requiring tracheostomy.   SUBJECTIVE:   Off vent > 48 hours.  Some confusion overnight. A bit agitated this am.  Klonopin, seroquel weaned yesterday.   VITAL SIGNS: Temp:  [97.7 F (36.5 C)-98.8 F (37.1 C)] 97.8 F (36.6 C) (02/06 0800) Pulse Rate:  [86-95] 90 (02/06 0800) Resp:  [20-33] 25 (02/06 0800) BP: (110-186)/(61-129) 131/64 (02/06 0800) SpO2:  [91 %-97 %] 95 % (02/06 0800) FiO2 (%):  [40 %-60 %] 40 % (02/06 0828) Weight:  [80.1 kg (176 lb 9.4 oz)] 80.1 kg (176 lb 9.4 oz) (02/06 0500)   Filed Weights   07/31/17 0439 08/01/17 0349 08/02/17 0500  Weight: 81.4 kg (179 lb 7.3 oz) 80.6 kg (177 lb 11.1 oz) 80.1 kg (176 lb 9.4 oz)    Intake/Output Summary (Last 24 hours) at 08/02/2017 0910 Last data filed at 08/02/2017 0700 Gross per 24 hour  Intake 1070 ml  Output 1550 ml  Net -480 ml     PHYSICAL EXAMINATION: General: chr Ill-appearing woman, NAD on ATC  HEENT: Trach midline, c/d, sutures in place  Neuro: awake, alert, follows commands, confused, intermittent mild agitation   CV: Regular, distant, no murmur, midline surgical scar, right shoulder staples PULM: resps even non labored on ATC, few scattered rhonchi  GI: Soft, nontender, positive bowel sounds Extremities: scant BLE edema  Skin: No rash  PULMONARY  CBC Recent Labs  Lab 07/30/17 0703  07/31/17 0427 07/31/17 1709 08/01/17 0339  08/01/17 1620  HGB 11.9*   < > 11.8* 13.9 12.2 13.6  HCT 39.5   < > 40.4 41.0 41.9 40.0  WBC 9.6  --  9.0  --  10.0  --   PLT 570*  --  471*  --  453*  --    < > = values in this interval not displayed.    COAGULATION No results for input(s): INR in the last 168 hours.  CARDIAC  No results for input(s): TROPONINI in the last 168 hours. No results for input(s): PROBNP in the last 168 hours.   CHEMISTRY Recent Labs  Lab 07/27/17 0320  07/28/17 5427  07/29/17 0017 07/29/17 0305  07/30/17 0142  07/30/17 1804 07/31/17 0427 07/31/17 1709 08/01/17 0339 08/01/17 1620  NA 137   < > 140   < > 140 141   < > 144   < > 147* 147* 148* 148* 149*  K 3.1*   < > 4.1   < > 3.8 3.2*   < > 3.2*   < > 3.7 3.8 3.8 3.8 3.2*  CL 95*   < > 96*   < > 94* 95*   < > 97*   < > 98* 103 103 104 104  CO2 30  --  31  --  33* 33*  --  34*  --   --  33*  --  31  --   GLUCOSE 134*   < > 132*   < >  137* 83   < > 124*   < > 111* 132* 110* 124* 121*  BUN 28*   < > 28*   < > 34* 32*   < > 26*   < > 29* 31* 31* 32* 31*  CREATININE 0.63   < > 0.63   < > 0.59 0.59   < > 0.60   < > 0.60 0.67 0.50 0.62 0.50  CALCIUM 6.7*  --  7.1*  --  7.3* 7.4*  --  7.7*  --   --  8.0*  --  8.2*  --   MG 2.1  --  1.8  --   --   --   --  2.0  --   --   --   --   --   --   PHOS 3.3  --  2.0*  --   --   --   --   --   --   --   --   --   --   --    < > = values in this interval not displayed.   Estimated Creatinine Clearance: 63.5 mL/min (by C-G formula based on SCr of 0.5 mg/dL).   LIVER Recent Labs  Lab 07/27/17 0320 07/28/17 0521 07/29/17 0305  AST 66* 70* 104*  ALT 39 45 61*  ALKPHOS 171* 182* 178*  BILITOT 1.1 1.5* 1.2  PROT 5.5* 5.8* 5.9*  ALBUMIN 2.8* 2.9* 2.9*    INFECTIOUS Recent Labs  Lab 07/27/17 0320  PROCALCITON 1.14    ENDOCRINE CBG (last 3)  Recent Labs    08/01/17 2042 08/02/17 0318 08/02/17 0754  GLUCAP 134* 105* 105*     IMAGING  Dg Chest Portable 1 View  Result Date:  08/01/2017 CLINICAL DATA:  Respiratory failure, history of bronchitis, CHF, pneumonia. EXAM: PORTABLE CHEST 1 VIEW COMPARISON:  Portable chest x-ray of July 31, 2017 FINDINGS: The lung volumes remain low. The tracheostomy tube tip projects at the level of the inferior margin of the clavicular heads. The interstitial markings are mildly prominent. The left lower lobe is slightly less dense today and the left hemidiaphragm better visualized. The cardiac silhouette remains enlarged. The pulmonary vascularity is not clearly engorged. The feeding tube tip projects below the inferior margin of the image. The right sided PICC line tip projects over the distal third of the SVC. IMPRESSION: Slight interval improvement in left lower lobe atelectasis or pneumonia. Stable hypoinflation. Stable cardiomegaly without significant pulmonary edema. Electronically Signed   By: David  Martinique M.D.   On: 08/01/2017 07:21   STUDIES: Echo12/12/2016 LVEF 55-60%, Grade 1 DD, Mild/Mod AI with LVOT, Mid ascending aortic diameter 48.88 mm, Mild LAE, RV normal Echo (07/22/17): EF 65-70%, bioprosthetic aortic valve functioning normally, PASP 33 mmHg, RV looks ok.   CULTURES 1/24 sputum >> oral flora  ANTIBIOTICS Ceftaz1/17 >> 1.20 Cefuroxime 1/21 >> 1/23 Ceftaz 1/23 >> 1/31  EVENTS 1/14  Admit 1/14 S/p AVR and ascending AA replacement  1/14 VF Arrest in evening with shock x 1 1/15 to OR for evacuation of hematoma and RVAD placement  1/16 PCCM consult 1/19 Lasix per cardiology,  CVP of 5 1/21 RVAD removed 1/22 heavily diuresed  1/23 ready for extubation. Atrial fib amio started.  1/24 reintubated 1/25  Afebrile 1/29  trach placed 2/01  Tolerating PMV with SLP   ASSESSMENT / PLAN: 77 year old female with PMHx significant for fusiform ascending aortic aneurysm, hypertension, hyperlipidemia, status post AVR and replacement of ascending aortic  aneurysm.  On 07/10/2017 postprocedure patient developed acute cardiogenic  shock had an episode of V. fib arrest was taken back to the OR on 07/11/2017 for evacuation of hematoma and placement of RVAD.  RVAD now removed, failed extubation 1/24 but continued to wean well. Underwent tracheostomy 1/29.  Course complicated by agitated delirium > improving slowly.    ASSESSMENT / PLAN:  Acute hypoxic respiratory failure - initially in setting of cardiogenic shock, resolving Tracheostomy Status - 1/29 P: Continue ATC as tol - off vent since 2/3 am Mobilize, pulmonary hygiene, PT PM valve as tolerated with SLP  Brovana, Pulmicort Control agitation as below  Ok to d/c trach sutures    HCAP/VAP, treated P:  Following off antibiotics   Cardiogenic shock - s/p removal of RVAD 1/21; sternum now closed Atrial fibrillation Medication related hypotension.  Back on levophed since started on propofol VF arrest, brief, immediately post op - no further incidence. Prolonged qT P: Per cards/CHF/CVTS  Continue Amiodarone Milrinone discontinued on 2/3 Scheduled spironolactone   Hypokalemia P: Replace electrolytes daily as indicated  Hypothyroidism P: Continue Synthroid  Hyperglycemia P: Scheduled Levemir Insulin per sliding scale  Constipation P: Senokot QD, hold for diarrhea Dulcolax Mobilize  Acute Encephalopathy Depression Agitated Delirium -  improving P: RASS GOAL: 0 Trying to wean clonazepam/seroquel but some increased agitation this am  Will increase a bit, klonopin 1mg  qhs and 0.25mg  daily am  Drop Seroquel 50 nightly Scheduled Effexor Haldol available as needed NO ambien   Dispo- CIR vs LTAC  Pt off vent >48 hrs.   Can likely tx to SDU once agitation improved.   PCCM will continue to follow 1-2x weekly for trach care. Please call sooner if needed.   Nickolas Madrid, NP 08/02/2017  9:10 AM Pager: [phone] or [phone]  Attending Note:  77 year old female with extensive cardiac history presents to PCCM in cardiogenic shock  and respiratory failure.  Patient was trached.  On exam, she is off the vent and doing remarkably well.  I reviewed CXR myself, trach is in good position.  Discussed with PCCM-NP.  Respiratory failure:  - Monitor closely for airway protection  - Remove vent from room  Acute encephalopathy:  - Wean benzos off as able  - Wean seroquel to off  - Decrease seroquel to 50 mg QHS  Trach status:  - Maintain current type and size of trach  - At the 2 wk line change to a cuffless 6  - Recommend LTAC placement  PCCM will continue to follow.  Patient seen and examined, agree with above note.  I dictated the care and orders written for this patient under my direction.  Rush Farmer, [place]

## 2017-08-02 NOTE — Progress Notes (Signed)
Patient ID: [name], female   DOB: [birth year], 77 y.o.   MRN: [id number]     Advanced Heart Failure Rounding Note  Primary Cardiologist: Dr. Percival Spanish (2014) HF: (New) Dr. Haroldine Laws   Subjective:    Events - 07/10/17 S/p AVR and ascending AA replacement  - 07/10/17 VF Arrest in evening with shock x 1 - 07/11/17 RVAD placement - 07/17/2017 RVAD removed with chest closed -1/23 Extubated. Went in A fib. Amio started.  -1/24 Reintubated.  -1/29 Trach -2/6 Tracheal aspirate - gram + cocci. Started vanc.   Remains off vent on 60% trach collar.   Yesterday diuresed with 60 mg po lasix. Weight down 1 pound.   Agitated over night. Denies pain. Trach aspirate with abundant WBCs and re-incubated for better growth.    Echo (07/22/17): EF 65-70%, bioprosthetic aortic valve functioning normally, PASP 33 mmHg, RV looks ok.   Objective:   Weight Range: 176 lb 9.4 oz (80.1 kg) Body mass index is 29.39 kg/m.   Vital Signs:   Temp:  [97.7 F (36.5 C)-98.8 F (37.1 C)] 97.8 F (36.6 C) (02/06 0800) Pulse Rate:  [86-95] 90 (02/06 0800) Resp:  [20-33] 25 (02/06 0800) BP: (110-186)/(61-129) 131/64 (02/06 0800) SpO2:  [91 %-97 %] 95 % (02/06 0800) FiO2 (%):  [60 %] 60 % (02/06 0400) Weight:  [176 lb 9.4 oz (80.1 kg)] 176 lb 9.4 oz (80.1 kg) (02/06 0500) Last BM Date: 08/01/17  Weight change: Filed Weights   07/31/17 0439 08/01/17 0349 08/02/17 0500  Weight: 179 lb 7.3 oz (81.4 kg) 177 lb 11.1 oz (80.6 kg) 176 lb 9.4 oz (80.1 kg)    Intake/Output:   Intake/Output Summary (Last 24 hours) at 08/02/2017 0813 Last data filed at 08/02/2017 0700 Gross per 24 hour  Intake 1070 ml  Output 1550 ml  Net -480 ml    Physical Exam  CVP 5 personally checked. Sitter at bedside.   General:  No resp difficulty HEENT:trach with secretions  Neck: supple. no JVD. Carotids 2+ bilat; no bruits. No lymphadenopathy or thryomegaly appreciated. Cor: PMI nondisplaced. Regular rate & rhythm. No rubs,  gallops or murmurs. Lungs: coarse throughout.  Abdomen: soft, nontender, nondistended. No hepatosplenomegaly. No bruits or masses. Good bowel sounds. Extremities: no cyanosis, clubbing, rash, edema. LLE SCD RUE PICC Neuro: Trach follows commands.    Telemetry  NSR 70-80s Personally reviewed  EKG    No new tracings.  Labs    CBC Recent Labs    07/31/17 0427  08/01/17 0339 08/01/17 1620  WBC 9.0  --  10.0  --   HGB 11.8*   < > 12.2 13.6  HCT 40.4   < > 41.9 40.0  MCV 101.8*  --  102.9*  --   PLT 471*  --  453*  --    < > = values in this interval not displayed.   Basic Metabolic Panel Recent Labs    07/31/17 0427  08/01/17 0339 08/01/17 1620  NA 147*   < > 148* 149*  K 3.8   < > 3.8 3.2*  CL 103   < > 104 104  CO2 33*  --  31  --   GLUCOSE 132*   < > 124* 121*  BUN 31*   < > 32* 31*  CREATININE 0.67   < > 0.62 0.50  CALCIUM 8.0*  --  8.2*  --    < > = values in this interval not displayed.   Liver Function Tests  No results for input(s): AST, ALT, ALKPHOS, BILITOT, PROT, ALBUMIN in the last 72 hours. No results for input(s): LIPASE, AMYLASE in the last 72 hours. Cardiac Enzymes No results for input(s): CKTOTAL, CKMB, CKMBINDEX, TROPONINI in the last 72 hours.  BNP: BNP (last 3 results) No results for input(s): BNP in the last 8760 hours.  ProBNP (last 3 results) No results for input(s): PROBNP in the last 8760 hours.   D-Dimer No results for input(s): DDIMER in the last 72 hours. Hemoglobin A1C No results for input(s): HGBA1C in the last 72 hours. Fasting Lipid Panel No results for input(s): CHOL, HDL, LDLCALC, TRIG, CHOLHDL, LDLDIRECT in the last 72 hours. Thyroid Function Tests No results for input(s): TSH, T4TOTAL, T3FREE, THYROIDAB in the last 72 hours.  Invalid input(s): FREET3  Other results:   Imaging    No results found.  Medications:    Scheduled Medications: . amiodarone  200 mg Oral BID  . arformoterol  15 mcg Nebulization BID    . aspirin EC  325 mg Oral Daily   Or  . aspirin  324 mg Per Tube Daily  . bisacodyl  10 mg Oral Daily   Or  . bisacodyl  10 mg Rectal Daily  . budesonide (PULMICORT) nebulizer solution  0.5 mg Nebulization BID  . chlorhexidine gluconate (MEDLINE KIT)  15 mL Mouth Rinse BID  . Chlorhexidine Gluconate Cloth  6 each Topical Daily  . clonazepam  0.5 mg Oral QHS  . enoxaparin (LOVENOX) injection  40 mg Subcutaneous Q24H  . feeding supplement (PRO-STAT SUGAR FREE 64)  30 mL Per Tube TID  . furosemide  60 mg Oral Daily  . Gerhardt's butt cream   Topical BID  . insulin aspart  0-24 Units Subcutaneous Q4H  . insulin detemir  10 Units Subcutaneous BID  . latanoprost  1 drop Both Eyes QHS  . levalbuterol  0.63 mg Nebulization BID  . levothyroxine  100 mcg Oral QAC breakfast  . mouth rinse  15 mL Mouth Rinse QID  . naphazoline-glycerin  1-2 drop Both Eyes BID  . pantoprazole (PROTONIX) IV  40 mg Intravenous Q12H  . QUEtiapine  50 mg Oral QHS  . senna-docusate  2 tablet Oral Daily  . sodium chloride flush  10-40 mL Intracatheter Q12H  . spironolactone  25 mg Oral Daily  . venlafaxine XR  75 mg Oral Daily    Infusions: . sodium chloride 250 mL (07/22/17 1820)  . feeding supplement (VITAL 1.5 CAL) 1,000 mL (08/02/17 0700)  . lactated ringers Stopped (07/17/17 1200)  . vancomycin      PRN Medications: haloperidol lactate, levalbuterol, metoprolol tartrate, ondansetron (ZOFRAN) IV  Patient Profile   [name] is a 77 y.o. female with history of fusiform ascending aortic aneurysm, HTN, and HLD.   Admitted 07/10/17 for planned AVR and replacement of ascending aortic aneurysm. Developed acute cardiogenic shock + VF arrest. Taken back to OR am of 07/11/17 for evacuation of hematoma and placement of RVAD.   Assessment/Plan   1. Cardiogenic shock s/p cardiac surgery with R>L CHF:  Pre-op Echo 06/02/2017 LVEF 55-60%, Grade 1 DD, Mild/Mod AI with LVOT, Mid ascending aortic diameter 48.88  mm, Mild LAE, RV normal.  Post-op shock, RVAD placed now s/p RVAD removal 07/17/2017.  Echo 1/26 with EF 65-70%, bioprosthetic aortic valve, improved RV function.  Volume status improved. Continue lasix 60 mg po daily.    - Continue 25 mg spironolactone daily.  -  RV function looked better  on echo 07/22/16.  2. VF arrest:  - Rhythm currently stable on po amio  - Goal K > 4.0 and Mg > 2.0 - Check Mag in am 3. Post Op Atrial fibrillation:  -- Only short run post op. So no AC for now. - Maintaining NSR.   - Currently only getting lovenox --->DVT prophylaxis. 4. Acute respiratory failure: Reintubated 1/24.   - S/P Trach 1/29.  - Remains on trach collar 40%.  - Has completed ceftazidime per CCM.  - tracheual aspirate--> gram + cocci. Started on vancomycin.  5. S/p AVR and Ascending aortic aneurysm replacement. - Post-op echo stable. 6. AKI:  - Resolved 7. Anemia:  - Stable 13.6   - s/p 1uPRBCs 07/23/17. 8. Hypokalemia - K 3.2. Supplement K .  - Continue to follow.   . Length of Stay: 23   Amy Clegg, NP  8:13 AM Advanced Heart Failure Team Pager [phone] (M-F; 7a - 4p)  Please contact [facility] for night-coverage after hours (4p -7a ) and weekends on [email or web address]  Patient seen and examined with Amy Clegg, NP. We discussed all aspects of the encounter. I agree with the assessment and plan as stated above.   Stable from cardiac standpoint. Volume status looks ok. Main issue is respiratory. Remains on trach with intermittent need for vent. Trach aspirate with abundant WBCs and re-incubated for better growth. Await final culture data. D/w Dr. Van Trigt. She will need LTAC eval.   Daniel Bensimhon, MD  6:38 PM     

## 2017-08-02 NOTE — Progress Notes (Signed)
RT note: RT removed 4 sutures 2 from Right and 2 from Left. Sight look good. Patient tolerated well.

## 2017-08-02 NOTE — Progress Notes (Signed)
16 Days Post-Op Procedure(s) (LRB): REMOVAL OF RVAD WITH PUMP STANDBY (N/A) TRANSESOPHAGEAL ECHOCARDIOGRAM (TEE) (N/A) Subjective: Very weak- can sit but not stand Off vent > 48 hrs Will ask for LTAC eval Had delerium last pm with reduced dosed of   klonopin, seroquel- will adjust Objective: Vital signs in last 24 hours: Temp:  [97.7 F (36.5 C)-98.8 F (37.1 C)] 97.8 F (36.6 C) (02/06 0800) Pulse Rate:  [86-95] 90 (02/06 0800) Cardiac Rhythm: Normal sinus rhythm (02/06 0400) Resp:  [20-33] 25 (02/06 0800) BP: (110-186)/(61-129) 131/64 (02/06 0800) SpO2:  [91 %-97 %] 95 % (02/06 0800) FiO2 (%):  [40 %-60 %] 40 % (02/06 0828) Weight:  [176 lb 9.4 oz (80.1 kg)] 176 lb 9.4 oz (80.1 kg) (02/06 0500)  Hemodynamic parameters for last 24 hours: CVP:  [5 mmHg-11 mmHg] 5 mmHg  Intake/Output from previous day: 02/05 0701 - 02/06 0700 In: 1370 [NG/GT:1220; IV Piggyback:150] Out: 1700 [Urine:1700] Intake/Output this shift: No intake/output data recorded.  Comfortable Moderate secretions Incisions clean, dry  Lab Results: Recent Labs    07/31/17 0427  08/01/17 0339 08/01/17 1620  WBC 9.0  --  10.0  --   HGB 11.8*   < > 12.2 13.6  HCT 40.4   < > 41.9 40.0  PLT 471*  --  453*  --    < > = values in this interval not displayed.   BMET:  Recent Labs    07/31/17 0427  08/01/17 0339 08/01/17 1620  NA 147*   < > 148* 149*  K 3.8   < > 3.8 3.2*  CL 103   < > 104 104  CO2 33*  --  31  --   GLUCOSE 132*   < > 124* 121*  BUN 31*   < > 32* 31*  CREATININE 0.67   < > 0.62 0.50  CALCIUM 8.0*  --  8.2*  --    < > = values in this interval not displayed.    PT/INR: No results for input(s): LABPROT, INR in the last 72 hours. ABG    Component Value Date/Time   PHART 7.453 (H) 07/26/2017 0430   HCO3 32.2 (H) 07/26/2017 0430   TCO2 32 08/01/2017 1620   ACIDBASEDEF 1.0 07/18/2017 0404   O2SAT 71.8 07/30/2017 0140   CBG (last 3)  Recent Labs    08/01/17 2042 08/02/17 0318  08/02/17 0754  GLUCAP 134* 105* 105*    Assessment/Plan: S/P Procedure(s) (LRB): REMOVAL OF RVAD WITH PUMP STANDBY (N/A) TRANSESOPHAGEAL ECHOCARDIOGRAM (TEE) (N/A) Cont PT  OOB to chair Cont TF- doubt will need PEG LTAC eval Heavy secretions with gram+ cocci- start vancomycin   LOS: 23 days    [name] 08/02/2017

## 2017-08-03 ENCOUNTER — Inpatient Hospital Stay (HOSPITAL_COMMUNITY): Payer: Medicare Other

## 2017-08-03 LAB — BASIC METABOLIC PANEL
Anion gap: 14 (ref 5–15)
BUN: 33 mg/dL — ABNORMAL HIGH (ref 6–20)
CO2: 26 mmol/L (ref 22–32)
Calcium: 8 mg/dL — ABNORMAL LOW (ref 8.9–10.3)
Chloride: 105 mmol/L (ref 101–111)
Creatinine, Ser: 0.67 mg/dL (ref 0.44–1.00)
GFR calc Af Amer: >60 mL/min (ref >60)
GFR calc non Af Amer: >60 mL/min (ref >60)
Glucose, Bld: 160 mg/dL — ABNORMAL HIGH (ref 65–99)
Potassium: 4 mmol/L (ref 3.5–5.1)
Sodium: 145 mmol/L (ref 135–145)

## 2017-08-03 LAB — GLUCOSE, CAPILLARY
Glucose-Capillary: 104 mg/dL — ABNORMAL HIGH (ref 65–99)
Glucose-Capillary: 107 mg/dL — ABNORMAL HIGH (ref 65–99)
Glucose-Capillary: 116 mg/dL — ABNORMAL HIGH (ref 65–99)
Glucose-Capillary: 116 mg/dL — ABNORMAL HIGH (ref 65–99)
Glucose-Capillary: 87 mg/dL (ref 65–99)
Glucose-Capillary: 94 mg/dL (ref 65–99)

## 2017-08-03 LAB — CULTURE, RESPIRATORY W GRAM STAIN

## 2017-08-03 LAB — POCT I-STAT 3, ART BLOOD GAS (G3+)
Acid-Base Excess: 4 mmol/L — ABNORMAL HIGH (ref 0.0–2.0)
Bicarbonate: 27.4 mmol/L (ref 20.0–28.0)
O2 Saturation: 90 %
Patient temperature: 98.6
TCO2: 28 mmol/L (ref 22–32)
pCO2 arterial: 34.9 mmHg (ref 32.0–48.0)
pH, Arterial: 7.502 — ABNORMAL HIGH (ref 7.350–7.450)
pO2, Arterial: 52 mmHg — ABNORMAL LOW (ref 83.0–108.0)

## 2017-08-03 LAB — CULTURE, RESPIRATORY: CULTURE: NORMAL

## 2017-08-03 LAB — MAGNESIUM: Magnesium: 2.2 mg/dL (ref 1.7–2.4)

## 2017-08-03 MED ORDER — POTASSIUM CHLORIDE 20 MEQ/15ML (10%) PO SOLN
20.0000 meq | Freq: Two times a day (BID) | ORAL | Status: AC
  Administered 2017-08-03 (×2): 20 meq
  Filled 2017-08-03 (×2): qty 15

## 2017-08-03 NOTE — Progress Notes (Signed)
  Speech Language Pathology Treatment: [name] Speaking valve  Patient Details Name: [name] MRN: [id number] DOB: 01/05/1941 Today's Date: 08/03/2017 Time: 0600-4599 SLP Time Calculation (min) (ACUTE ONLY): 30 min  Assessment / Plan / Recommendation Clinical Impression  Pt  just completed therapy with PT/OT and conversant with therapist while wearing valve. Trach changed today to cuffless, continues with # 6. Pt's respirations stable at 25 while wearing valve during bedside swallow assessment. HR 90 and O2 saturation probe not picking up a reading . She coordinated respirations and swallow without dyspnea. Vocal intensity adequate today. No signs back pressure when PMV removed. Continue to wear PMV with staff and therapists with full supervision.    HPI HPI: Pt admitted 07/10/17 for AVR and ascending AA replacement. Post procedure had VF arrest. 07/11/17 back to OR to evacuate hematoma and place RVAD due to fluid overload and R systolic HF. RVAD subsequently removed. Pt intubated from 07/10/17-07/19/17. Re-intubated 1/24-1/29; trach placed 1/29. PMH: PVD, afib, vfib, hypothyroidism, anxiety and depression, HTN, HLD, R TKA.      SLP Plan  Continue with current plan of care       Recommendations  Medication Administration: Via alternative means      Patient may use Passy-Muir Speech Valve: During all therapies with supervision PMSV Supervision: Full         General recommendations: Rehab consult Oral Care Recommendations: Oral care QID Follow up Recommendations: Inpatient Rehab;24 hour supervision/assistance SLP Visit Diagnosis: Aphonia (R49.1) Plan: Continue with current plan of care       GO                [name] 08/03/2017, 4:02 PM   [name].Ed [employer] [phone]

## 2017-08-03 NOTE — Evaluation (Signed)
Clinical/Bedside Swallow Evaluation Patient Details  Name: [name] MRN: [id number] Date of Birth: 21-Nov-1940  Today's Date: 08/03/2017 Time: SLP Start Time (ACUTE ONLY): 7829 SLP Stop Time (ACUTE ONLY): 1503 SLP Time Calculation (min) (ACUTE ONLY): 30 min  Past Medical History:  Past Medical History:  Diagnosis Date  . ACL tear    right  Dr. Gladstone Pih   . Adenomatous polyp   . Anxiety   . Aorta aneurysm ([place])   . Arthritis   . Ascending aortic aneurysm ([place])    note per chart per Dr Lucianne Lei Tright 4.7 cm 04/15/2015   . B12 deficiency   . Cancer ([place])   . Cataracts, both eyes 10/2006  . Chronic bronchitis ([place])   . Constipation   . Depression   . DUB (dysfunctional uterine bleeding) 10/96  . ETD (eustachian tube dysfunction)   . Fall   . Fibromyalgia   . GERD (gastroesophageal reflux disease)   . Glaucoma    ([place]) Dr. Arnoldo Morale   . Glaucoma    bilaterally  . Helicobacter pylori (H. pylori)   . Hiatal hernia   . History of bronchitis   . History of frequent urinary tract infections   . History of kidney stones   . History of left shoulder fracture    pt states fell off bed and broke ball in shoulder had rod placed   . Hyperlipidemia   . Hypertension   . Hypothyroidism   . Insomnia   . Leg wound, left    healed   . MVP (mitral valve prolapse)   . Occasional tremors    left arm   . Osteoarthritis   . Osteopenia   . Osteoporosis   . Other and unspecified hyperlipidemia   . Post-menopausal   . Thyroid cancer ([place])   . Thyroid nodule   . Tremor    Past Surgical History:  Past Surgical History:  Procedure Laterality Date  . AORTIC VALVE REPLACEMENT N/A 07/10/2017   Procedure: AORTIC VALVE REPLACEMENT (AVR);  Surgeon: [name], Collier Salina, MD;  Location: [place];  Service: Open Heart Surgery;  Laterality: N/A;  . APPENDECTOMY    . BUNIONECTOMY  08/1999   right - Dr. Irving Shows   . CHOLECYSTECTOMY  1979  . DILATION AND CURETTAGE OF UTERUS  03/31/95   Dr. Ovid Curd   .  EXPLORATION POST OPERATIVE OPEN HEART N/A 07/11/2017   Procedure: EXPLORATION POST OPERATIVE OPEN HEART;  Surgeon: Ivin Poot, MD;  Location: [place];  Service: Open Heart Surgery;  Laterality: N/A;  . EYE SURGERY     also had left cataract removed   . FRACTURE SURGERY    . HEMATOMA EVACUATION N/A 07/11/2017   Procedure: EVACUATION HEMATOMA;  Surgeon: Ivin Poot, MD;  Location: [place];  Service: Open Heart Surgery;  Laterality: N/A;  . HERNIA REPAIR    . NEPHROLITHOTOMY Left 04/07/2017   Procedure: NEPHROLITHOTOMY PERCUTANEOUS WITH SURGEON ACCESS;  Surgeon: Ardis Hughs, MD;  Location[facility];  Service: Urology;  Laterality: Left;  . ORIF HUMERUS FRACTURE  05/30/2011   Procedure: OPEN REDUCTION INTERNAL FIXATION (ORIF) PROXIMAL HUMERUS FRACTURE;  Surgeon: Augustin Schooling;  Location: [place];  Service: Orthopedics;  Laterality: Left;  open reduction internal fixation of proximal humerus fracture  . PLACEMENT OF CENTRIMAG VENTRICULAR ASSIST DEVICE Right 07/11/2017   Procedure: PLACEMENT OF CENTRIMAG VENTRICULAR ASSIST DEVICE;  Surgeon: Ivin Poot, MD;  Location: [place];  Service: Open Heart Surgery;  Laterality: Right;  .  REMOVAL OF CENTRIMAG VENTRICULAR ASSIST DEVICE N/A 07/17/2017   Procedure: REMOVAL OF RVAD WITH PUMP STANDBY;  Surgeon: Ivin Poot, MD;  Location: [place];  Service: Open Heart Surgery;  Laterality: N/A;  . REPLACEMENT ASCENDING AORTA N/A 07/10/2017   Procedure: REPLACEMENT ASCENDING AORTA;  Surgeon: Ivin Poot, MD;  Location: [place];  Service: Open Heart Surgery;  Laterality: N/A;  . right knee replacement  3/11   Dr. Gladstone Pih  . RIGHT/LEFT HEART CATH AND CORONARY ANGIOGRAPHY N/A 06/13/2017   Procedure: RIGHT/LEFT HEART CATH AND CORONARY ANGIOGRAPHY;  Surgeon: Jolaine Artist, MD;  Location: [place] [facility];  Service: Cardiovascular;  Laterality: N/A;  . rt. eye cataract  05/28/07  . TEE WITHOUT CARDIOVERSION N/A 07/10/2017   Procedure:  TRANSESOPHAGEAL ECHOCARDIOGRAM (TEE);  Surgeon: [name], Collier Salina, MD;  Location: [place];  Service: Open Heart Surgery;  Laterality: N/A;  . TEE WITHOUT CARDIOVERSION  07/11/2017   Procedure: TRANSESOPHAGEAL ECHOCARDIOGRAM (TEE);  Surgeon: [name], Collier Salina, MD;  Location: [place];  Service: Open Heart Surgery;;  . TEE WITHOUT CARDIOVERSION N/A 07/17/2017   Procedure: TRANSESOPHAGEAL ECHOCARDIOGRAM (TEE);  Surgeon: [name], Collier Salina, MD;  Location: [place];  Service: Open Heart Surgery;  Laterality: N/A;  . THYROID LOBECTOMY Left 07/27/2015   Procedure: LEFT THYROID LOBECTOMY;  Surgeon: Fanny Skates, MD;  [facility];  Service: General;  Laterality: Left;  . THYROIDECTOMY N/A 08/06/2015   Procedure: RIGHT THYROID LOBECTOMY, REIMPLANTATION PARATHYROID;  Surgeon: Fanny Skates, MD;  [facility];  Service: General;  Laterality: N/A;  . VENTRAL HERNIA REPAIR  2/99   HPI:  Pt admitted 07/10/17 for AVR and ascending AA replacement. Post procedure had VF arrest. 07/11/17 back to OR to evacuate hematoma and place RVAD due to fluid overload and R systolic HF. RVAD subsequently removed. Pt intubated from 07/10/17-07/19/17. Re-intubated 1/24-1/29; trach placed 1/29. PMH: PVD, afib, vfib, hypothyroidism, anxiety and depression, HTN, HLD, R TKA.   Assessment / Plan / Recommendation Clinical Impression  Pt with prolonged hospital stay, intubated 15 days, received trach 1/29 and has been working with ST to increase safety and duration of valve useage. For approximately a week pt's arousal/alertness has been poor due to changes in meds/requiring Haldol etc. Yesterday and today she has maintained fair alertness, up in chair with PT/OT and appropriate for brief bedside swallow assessment (will need objective eval) with valve donned. High probability of airway intrusion with water indicated by immediate and delayed cough, wet vocal quality after water and pudding. Pt will need objective swallow assessment to fully evaluate  oral and pharyngeal swallow function. Continue NGT.      SLP Visit Diagnosis: Dysphagia, unspecified (R13.10)    Aspiration Risk  Moderate aspiration risk    Diet Recommendation NPO   Medication Administration: Via alternative means    Other  Recommendations Oral Care Recommendations: Oral care QID   Follow up Recommendations Inpatient Rehab;24 hour supervision/assistance      Frequency and Duration min 2x/week  2 weeks       Prognosis        Swallow Study   General HPI: Pt admitted 07/10/17 for AVR and ascending AA replacement. Post procedure had VF arrest. 07/11/17 back to OR to evacuate hematoma and place RVAD due to fluid overload and R systolic HF. RVAD subsequently removed. Pt intubated from 07/10/17-07/19/17. Re-intubated 1/24-1/29; trach placed 1/29. PMH: PVD, afib, vfib, hypothyroidism, anxiety and depression, HTN, HLD, R TKA. Type of Study: Bedside Swallow Evaluation Previous Swallow  Assessment: (none) Diet Prior to this Study: NPO;NG Tube Temperature Spikes Noted: No Respiratory Status: Trach;Trach Collar Trach Size and Type: Uncuffed;#6;With PMSV in place History of Recent Intubation: Yes Length of Intubations (days): 15 days Date extubated: (trach 07/25/17) Behavior/Cognition: Cooperative;Pleasant mood;Lethargic/Drowsy;Requires cueing Oral Cavity Assessment: Other (comment)(lingual candidias) Oral Care Completed by SLP: Yes Oral Cavity - Dentition: Adequate natural dentition Vision: Functional for self-feeding Self-Feeding Abilities: Able to feed self;Needs set up;Needs assist Patient Positioning: Upright in chair Baseline Vocal Quality: Hoarse Volitional Cough: Strong    Oral/Motor/Sensory Function Overall Oral Motor/Sensory Function: Generalized oral weakness   Ice Chips Ice chips: Impaired Presentation: Spoon Oral Phase Impairments: Reduced lingual movement/coordination Pharyngeal Phase Impairments: Wet Vocal Quality   Thin Liquid Thin Liquid:  Impaired Presentation: Spoon;Cup Pharyngeal  Phase Impairments: Cough - Immediate;Throat Clearing - Delayed    Nectar Thick Nectar Thick Liquid: Not tested   Honey Thick Honey Thick Liquid: Not tested   Puree Puree: Impaired Oral Phase Impairments: Reduced labial seal;Reduced lingual movement/coordination Oral Phase Functional Implications: (labial residue) Pharyngeal Phase Impairments: Wet Vocal Quality   Solid   GO   Solid: Not tested        [name] 08/03/2017,3:55 PM   [name].[employer] [phone]

## 2017-08-03 NOTE — Progress Notes (Signed)
..  Name: [name] MRN: [id number] DOB: Oct 02, 1940    ADMISSION DATE:  07/10/2017  CONSULTATION DATE:  07/12/17  REFERRING MD :  Nils Pyle MD   BRIEF:   77 year old female with past medical history significant for fusiform ascending aortic aneurysm, hypertension, hyperlipidemia, status post AVR and replacement of ascending aortic aneurysm.  On 07/10/2017 postprocedure patient developed acute cardiogenic shock had an episode of V. fib arrest was taken back to the OR on 07/11/2017 for evacuation of hematoma and placement of RVAD. She had prolonged mechanical ventilation requiring tracheostomy.   SUBJECTIVE:   Tolerating TC very well.    VITAL SIGNS: Temp:  [97.7 F (36.5 C)-99.4 F (37.4 C)] 97.9 F (36.6 C) (02/07 0700) Pulse Rate:  [88-95] 88 (02/07 0700) Resp:  [16-32] 24 (02/07 0700) BP: (105-134)/(59-88) 123/71 (02/07 0700) SpO2:  [90 %-99 %] 96 % (02/07 0700) FiO2 (%):  [40 %] 40 % (02/07 0733) Weight:  [81.4 kg (179 lb 7.3 oz)] 81.4 kg (179 lb 7.3 oz) (02/07 0500)   Filed Weights   08/01/17 0349 08/02/17 0500 08/03/17 0500  Weight: 80.6 kg (177 lb 11.1 oz) 80.1 kg (176 lb 9.4 oz) 81.4 kg (179 lb 7.3 oz)    Intake/Output Summary (Last 24 hours) at 08/03/2017 1029 Last data filed at 08/03/2017 1000 Gross per 24 hour  Intake 1900 ml  Output 900 ml  Net 1000 ml     PHYSICAL EXAMINATION: General: Chronically ill appearing female, resting comfortable in exam bed.  NAD HEENT: Trach in place, sutures out Neuro: Alert and interactive, moving all ext to command CV: RRR, Nl S1/S2 and -M/R/G PULM: Coarse BS diffusely GI: Soft, NT, ND and +BS Extremities: Bilateral edema improving Skin: No rash  PULMONARY  CBC Recent Labs  Lab 07/30/17 0703  07/31/17 0427 07/31/17 1709 08/01/17 0339 08/01/17 1620  HGB 11.9*   < > 11.8* 13.9 12.2 13.6  HCT 39.5   < > 40.4 41.0 41.9 40.0  WBC 9.6  --  9.0  --  10.0  --   PLT 570*  --  471*  --  453*  --    < > = values in this  interval not displayed.    COAGULATION No results for input(s): INR in the last 168 hours.  CARDIAC  No results for input(s): TROPONINI in the last 168 hours. No results for input(s): PROBNP in the last 168 hours.   CHEMISTRY Recent Labs  Lab 07/28/17 0521  07/29/17 0305  07/30/17 0142  07/31/17 0427 07/31/17 1709 08/01/17 0339 08/01/17 1620 08/03/17 0548  NA 140   < > 141   < > 144   < > 147* 148* 148* 149* 145  K 4.1   < > 3.2*   < > 3.2*   < > 3.8 3.8 3.8 3.2* 4.0  CL 96*   < > 95*   < > 97*   < > 103 103 104 104 105  CO2 31   < > 33*  --  34*  --  33*  --  31  --  26  GLUCOSE 132*   < > 83   < > 124*   < > 132* 110* 124* 121* 160*  BUN 28*   < > 32*   < > 26*   < > 31* 31* 32* 31* 33*  CREATININE 0.63   < > 0.59   < > 0.60   < > 0.67 0.50 0.62 0.50 0.67  CALCIUM 7.1*   < > 7.4*  --  7.7*  --  8.0*  --  8.2*  --  8.0*  MG 1.8  --   --   --  2.0  --   --   --   --   --  2.2  PHOS 2.0*  --   --   --   --   --   --   --   --   --   --    < > = values in this interval not displayed.   Estimated Creatinine Clearance: 64.1 mL/min (by C-G formula based on SCr of 0.67 mg/dL).   LIVER Recent Labs  Lab 07/28/17 0521 07/29/17 0305  AST 70* 104*  ALT 45 61*  ALKPHOS 182* 178*  BILITOT 1.5* 1.2  PROT 5.8* 5.9*  ALBUMIN 2.9* 2.9*    INFECTIOUS No results for input(s): LATICACIDVEN, PROCALCITON in the last 168 hours.  ENDOCRINE CBG (last 3)  Recent Labs    08/02/17 2342 08/03/17 0342 08/03/17 0743  GLUCAP 137* 116* 94     IMAGING  Dg Chest Port 1 View  Result Date: 08/03/2017 CLINICAL DATA:  Increasing shortness of Breath EXAM: PORTABLE CHEST 1 VIEW COMPARISON:  08/01/2017 FINDINGS: Tracheostomy tube, right-sided PICC line and feeding catheter are again noted and stable. The overall inspiratory effort is improved. Some residual basilar atelectatic changes are noted on the left. No significant vascular congestion is noted. Postsurgical changes are again seen.  IMPRESSION: Mild left basilar atelectasis. Electronically Signed   By: Inez Catalina M.D.   On: 08/03/2017 09:08   STUDIES: Echo12/12/2016 LVEF 55-60%, Grade 1 DD, Mild/Mod AI with LVOT, Mid ascending aortic diameter 48.88 mm, Mild LAE, RV normal Echo (07/22/17): EF 65-70%, bioprosthetic aortic valve functioning normally, PASP 33 mmHg, RV looks ok.   CULTURES 1/24 sputum >> oral flora  ANTIBIOTICS Ceftaz1/17 >> 1.20 Cefuroxime 1/21 >> 1/23 Ceftaz 1/23 >> 1/31  EVENTS 1/14  Admit 1/14 S/p AVR and ascending AA replacement  1/14 VF Arrest in evening with shock x 1 1/15 to OR for evacuation of hematoma and RVAD placement  1/16 PCCM consult 1/19 Lasix per cardiology,  CVP of 5 1/21 RVAD removed 1/22 heavily diuresed  1/23 ready for extubation. Atrial fib amio started.  1/24 reintubated 1/25  Afebrile 1/29  trach placed 2/01  Tolerating PMV with SLP   I reviewed CXR myself, trach is in good position  ASSESSMENT / PLAN: 77 year old female with PMHx significant for fusiform ascending aortic aneurysm, hypertension, hyperlipidemia, status post AVR and replacement of ascending aortic aneurysm.  On 07/10/2017 postprocedure patient developed acute cardiogenic shock had an episode of V. fib arrest was taken back to the OR on 07/11/2017 for evacuation of hematoma and placement of RVAD.  RVAD now removed, failed extubation 1/24 but continued to wean well. Underwent tracheostomy 1/29.  Course complicated by agitated delirium > improving slowly.   Discussed with PCCM-NP  ASSESSMENT / PLAN:  Acute hypoxic respiratory failure - initially in setting of cardiogenic shock, resolving Tracheostomy Status - 1/29 P: Continue ATC as tol - off vent since 2/3 am Change trach to a cuffless 6 today to accommodate weaning and SLP Mobilize, pulmonary hygiene, PT PM valve as tolerated with SLP  Garlon Hatchet, Pulmicort Control agitation as below   HCAP/VAP, treated P: Following off antibiotics  Cardiogenic  shock - s/p removal of RVAD 1/21; sternum now closed Atrial fibrillation Medication related hypotension.  Back on levophed since started on  propofol VF arrest, brief, immediately post op - no further incidence. Prolonged qT P: Per cards/CHF/CVTS  Continue Amiodarone Milrinone discontinued on 2/3 Scheduled spironolactone  Hypothyroidism P: Continue Synthroid  Hyperglycemia P: Scheduled Levemir Insulin per sliding scale  Constipation P: Senokot QD, hold for diarrhea Dulcolax Mobilize  Acute Encephalopathy Depression Agitated Delirium -  improving P: RASS GOAL: 0 Trying to wean clonazepam/seroquel but some increased agitation this am  Dropped Seroquel 50 nightly Scheduled Effexor Haldol available as needed NO ambien   Trach status:  - Change trach to a cuffless 6 today  PCCM will follow for trach management.  Rush Farmer, M.D. [facility] Pulmonary/Critical Care Medicine. Pager: [phone]. After hours pager: [phone].

## 2017-08-03 NOTE — Progress Notes (Signed)
Patient ID: [name], female   DOB: 1940/12/06, 77 y.o.   MRN: [id number]     Advanced Heart Failure Rounding Note  Primary Cardiologist: Dr. Percival Spanish (2014) HF: (New) Dr. Haroldine Laws   Subjective:    Events - 07/10/17 S/p AVR and ascending AA replacement  - 07/10/17 VF Arrest in evening with shock x 1 - 07/11/17 RVAD placement - 07/17/2017 RVAD removed with chest closed -1/23 Extubated. Went in A fib. Amio started.  -1/24 Reintubated.  -1/29 Trach -2/6 Tracheal aspirate - gram + cocci. Started vanc.   Remains on 40% trach collar.   Denies SOB/CP. Trach changed today. Tracheal aspirate with normal resp flora.    Echo (07/22/17): EF 65-70%, bioprosthetic aortic valve functioning normally, PASP 33 mmHg, RV looks ok.   Objective:   Weight Range: 179 lb 7.3 oz (81.4 kg) Body mass index is 29.86 kg/m.   Vital Signs:   Temp:  [97.7 F (36.5 C)-99.4 F (37.4 C)] 97.9 F (36.6 C) (02/07 0700) Pulse Rate:  [88-95] 88 (02/07 0700) Resp:  [16-32] 24 (02/07 0700) BP: (105-134)/(59-88) 123/71 (02/07 0700) SpO2:  [90 %-99 %] 96 % (02/07 0700) FiO2 (%):  [40 %] 40 % (02/07 0733) Weight:  [179 lb 7.3 oz (81.4 kg)] 179 lb 7.3 oz (81.4 kg) (02/07 0500) Last BM Date: 08/02/17  Weight change: Filed Weights   08/01/17 0349 08/02/17 0500 08/03/17 0500  Weight: 177 lb 11.1 oz (80.6 kg) 176 lb 9.4 oz (80.1 kg) 179 lb 7.3 oz (81.4 kg)    Intake/Output:   Intake/Output Summary (Last 24 hours) at 08/03/2017 3704 Last data filed at 08/03/2017 0700 Gross per 24 hour  Intake 1580 ml  Output 850 ml  Net 730 ml    Physical Exam  CVP 6-7  General:  No resp difficulty. In bed.  HEENT: trach  Neck: supple. no JVD. Carotids 2+ bilat; no bruits. No lymphadenopathy or thryomegaly appreciated. Cor: PMI nondisplaced. Regular rate & rhythm. No rubs, gallops or murmurs. Lungs: coarse througout  Abdomen: soft, nontender, nondistended. No hepatosplenomegaly. No bruits or masses. Good bowel  sounds. Extremities: no cyanosis, clubbing, rash, edema. RIJ  Neuro: Trach. Follows commands. MAE.      Telemetry  NSR 80-90s personally reviewed.   EKG    No new tracings.  Labs    CBC Recent Labs    08/01/17 0339 08/01/17 1620  WBC 10.0  --   HGB 12.2 13.6  HCT 41.9 40.0  MCV 102.9*  --   PLT 453*  --    Basic Metabolic Panel Recent Labs    08/01/17 0339 08/01/17 1620 08/03/17 0548  NA 148* 149* 145  K 3.8 3.2* 4.0  CL 104 104 105  CO2 31  --  26  GLUCOSE 124* 121* 160*  BUN 32* 31* 33*  CREATININE 0.62 0.50 0.67  CALCIUM 8.2*  --  8.0*  MG  --   --  2.2   Liver Function Tests No results for input(s): AST, ALT, ALKPHOS, BILITOT, PROT, ALBUMIN in the last 72 hours. No results for input(s): LIPASE, AMYLASE in the last 72 hours. Cardiac Enzymes No results for input(s): CKTOTAL, CKMB, CKMBINDEX, TROPONINI in the last 72 hours.  BNP: BNP (last 3 results) No results for input(s): BNP in the last 8760 hours.  ProBNP (last 3 results) No results for input(s): PROBNP in the last 8760 hours.   D-Dimer No results for input(s): DDIMER in the last 72 hours. Hemoglobin A1C No results for  input(s): HGBA1C in the last 72 hours. Fasting Lipid Panel No results for input(s): CHOL, HDL, LDLCALC, TRIG, CHOLHDL, LDLDIRECT in the last 72 hours. Thyroid Function Tests No results for input(s): TSH, T4TOTAL, T3FREE, THYROIDAB in the last 72 hours.  Invalid input(s): FREET3  Other results:   Imaging    Dg Chest Port 1 View  Result Date: 08/03/2017 CLINICAL DATA:  Increasing shortness of Breath EXAM: PORTABLE CHEST 1 VIEW COMPARISON:  08/01/2017 FINDINGS: Tracheostomy tube, right-sided PICC line and feeding catheter are again noted and stable. The overall inspiratory effort is improved. Some residual basilar atelectatic changes are noted on the left. No significant vascular congestion is noted. Postsurgical changes are again seen. IMPRESSION: Mild left basilar  atelectasis. Electronically Signed   By: Inez Catalina M.D.   On: 08/03/2017 09:08    Medications:    Scheduled Medications: . amiodarone  200 mg Oral BID  . arformoterol  15 mcg Nebulization BID  . aspirin EC  325 mg Oral Daily   Or  . aspirin  324 mg Per Tube Daily  . bisacodyl  10 mg Oral Daily  . budesonide (PULMICORT) nebulizer solution  0.5 mg Nebulization BID  . chlorhexidine gluconate (MEDLINE KIT)  15 mL Mouth Rinse BID  . Chlorhexidine Gluconate Cloth  6 each Topical Daily  . clonazepam  0.25 mg Per Tube Daily  . clonazepam  1 mg Oral QHS  . enoxaparin (LOVENOX) injection  40 mg Subcutaneous Q24H  . feeding supplement (PRO-STAT SUGAR FREE 64)  30 mL Per Tube TID  . furosemide  60 mg Oral Daily  . Gerhardt's butt cream   Topical BID  . insulin aspart  0-24 Units Subcutaneous Q4H  . insulin detemir  10 Units Subcutaneous BID  . latanoprost  1 drop Both Eyes QHS  . levalbuterol  0.63 mg Nebulization BID  . levothyroxine  100 mcg Oral QAC breakfast  . mouth rinse  15 mL Mouth Rinse QID  . naphazoline-glycerin  1-2 drop Both Eyes BID  . pantoprazole (PROTONIX) IV  40 mg Intravenous Q12H  . potassium chloride  20 mEq Per Tube BID  . QUEtiapine  75 mg Oral QHS  . senna-docusate  2 tablet Oral Daily  . sodium chloride flush  10-40 mL Intracatheter Q12H  . spironolactone  25 mg Oral Daily  . venlafaxine XR  75 mg Oral Daily    Infusions: . sodium chloride 250 mL (07/22/17 1820)  . feeding supplement (VITAL 1.5 CAL) 1,000 mL (08/02/17 2035)  . lactated ringers Stopped (07/17/17 1200)  . vancomycin 750 mg (08/03/17 0839)    PRN Medications: acetaminophen (TYLENOL) oral liquid 160 mg/5 mL, haloperidol lactate, levalbuterol, metoprolol tartrate, ondansetron (ZOFRAN) IV  Patient Profile   [name] is a 77 y.o. female with history of fusiform ascending aortic aneurysm, HTN, and HLD.   Admitted 07/10/17 for planned AVR and replacement of ascending aortic aneurysm.  Developed acute cardiogenic shock + VF arrest. Taken back to OR am of 07/11/17 for evacuation of hematoma and placement of RVAD.   Assessment/Plan   1. Cardiogenic shock s/p cardiac surgery with R>L CHF:  Pre-op Echo 06/02/2017 LVEF 55-60%, Grade 1 DD, Mild/Mod AI with LVOT, Mid ascending aortic diameter 48.88 mm, Mild LAE, RV normal.  Post-op shock, RVAD placed now s/p RVAD removal 07/17/2017.  Echo 1/26 with EF 65-70%, bioprosthetic aortic valve, improved RV function.  Volume status stable. Continue lasix 60 mg po daily.     - Continue 25  mg spironolactone daily.  -  RV function looked better on echo 07/22/16.  2. VF arrest:  - Rhythm currently stable on po amio  - Goal K > 4.0 and Mg > 2.0 -Stable.  3. Post Op Atrial fibrillation:  -- Only short run post op. So no AC for now. - Maintaining NSR.   - Currently only getting lovenox --->DVT prophylaxis. 4. Acute respiratory failure: Reintubated 1/24.   - S/P Trach 1/29.  - Remains on trach collar 40%.  - Has completed ceftazidime per CCM.  - tracheual aspirate--> gram + cocci. Started on vancomycin.  5. S/p AVR and Ascending aortic aneurysm replacement. - Post-op echo stable. 6. AKI:  - Resolved 7. Anemia:  - Stable 13.6   - s/p 1uPRBCs 07/23/17. 8. Hypokalemia K 4. Stable.   - Continue to follow.   . Length of Stay: [place], NP  9:22 AM Advanced Heart Failure Team Pager [phone] (M-F; 7a - 4p)  Please contact [facility] for night-coverage after hours (4p -7a ) and weekends on [email or web address]  Patient seen and examined with Darrick Grinder, NP. We discussed all aspects of the encounter. I agree with the assessment and plan as stated above.   Stable from cardiac perspective. Volume status looks good. Main issue is trach management. Consider LTCA. We will follow at a distance. Rhythm stable can decrease to 200 daily.   Glori Bickers, MD  12:04 PM

## 2017-08-03 NOTE — Progress Notes (Signed)
Inpatient Rehabilitation  Note medical team's goal is for IP Rehab.  However, patient's therapy tolerance and ability to participate needs to increase in order to obtain insurance authorization.  I have called and spoke with patient's spouse about IP Rehab as well as the potential need for a back-up plan.  Will continue to follow for the timing of medical readiness, therapy tolerance/participation, insurance authorization, and IP Rehab bed availability.  Call if questions.  Carmelia Roller., CCC/SLP Admission Coordinator  [place]  Cell [phone]

## 2017-08-03 NOTE — Procedures (Signed)
First Trach Change  Cuffed 6 removed and cuffless 6 placed without difficulty with good bilateral air movement and good sats.  Rush Farmer, M.D. [facility] Pulmonary/Critical Care Medicine. Pager: [phone]. After hours pager: [phone].

## 2017-08-03 NOTE — Progress Notes (Signed)
Occupational Therapy Treatment Patient Details Name: [name] MRN: [id number] DOB: May 15, 1941 Today's Date: 08/03/2017    History of present illness Pt admitted 07/10/17 for AVR and ascending AA replacement. Post procedure had VF arrest. 07/11/17 back to OR to evacuate hematoma and place RVAD due to fluid overload and R systolic HF. RVAD subsequently removed. Pt intubated from 07/10/17-07/19/17. Re-intubated 1/24-1/29; trach placed 1/29. PMH: PVD, afib, vfib, hypothyroidism, anxiety and depression, HTN, HLD, R TKA.   OT comments  Pt with greatly improved session. Pt much more communicative with PMV in place. Pt able to simulate toilet transfer through transfer to recliner with max +2 assist. Pt also able to perform more seated grooming tasks including increased ROM with BUE for brushing hair and washing face. Pt is an excellent candidate for CIR level therapies and is motivated. She has support from her husband who was present for session. OT will continue to follow acutely (goals updated)  Follow Up Recommendations  CIR    Equipment Recommendations  3 in 1 bedside commode    Recommendations for Other Services Rehab consult    Precautions / Restrictions Precautions Precautions: Fall;Sternal Precaution Comments: reinforced sternal precautions during bed mobility Restrictions Weight Bearing Restrictions: No Other Position/Activity Restrictions: sternal precautions       Mobility Bed Mobility Overal bed mobility: Needs Assistance Bed Mobility: Supine to Sit Rolling: Max assist;+2 for physical assistance Sidelying to sit: Max assist;+2 for physical assistance;HOB elevated Supine to sit: Max assist;+2 for physical assistance     General bed mobility comments: Cues for hand placement to comply with sternal precautions.  Pt with attempt and active participation to move LEs to edge of bed with improved success but remains to require assistance.  Use of bed pad to scoot to edge and  assistance required for trunk elevation.   Transfers Overall transfer level: Needs assistance Equipment used: 2 person hand held assist Transfers: Sit to/from Stand Sit to Stand: +2 physical assistance;Max assist   Squat pivot transfers: Total assist;+2 physical assistance     General transfer comment: Pt able to stand upright with cues for upper trunk control, Pt performed shuffling pivot steps from bed to chair with increased assistance to rotate hips.      Balance Overall balance assessment: Needs assistance Sitting-balance support: Feet supported;Bilateral upper extremity supported Sitting balance-Leahy Scale: Poor Sitting balance - Comments: Pt required intermittent min to mod assist to maintain sitting.  Pt performed limited reaching activity with hand over hand assistance to midline of body.   Postural control: Posterior lean;Right lateral lean   Standing balance-Leahy Scale: Poor Standing balance comment: dependent on external support provided by therapists                           ADL either performed or assessed with clinical judgement   ADL Overall ADL's : Needs assistance/impaired Eating/Feeding: NPO   Grooming: Wash/dry face;Brushing hair;Maximal assistance;Sitting Grooming Details (indicate cue type and reason): Pt able to bring hand to face and wash eyes with wash cloth, Pt able to run comb through her hair                 Toilet Transfer: Maximal assistance;+2 for physical assistance;Stand-pivot Toilet Transfer Details (indicate cue type and reason): simualted through recliner transfer Toileting- Clothing Manipulation and Hygiene: Total assistance       Functional mobility during ADLs: Maximal assistance;+2 for physical assistance;+2 for safety/equipment;Cueing for sequencing;Cueing for safety  Vision   Additional Comments: will further assess   Perception     Praxis      Cognition Arousal/Alertness: Awake/alert Behavior  During Therapy: WFL for tasks assessed/performed;Impulsive Overall Cognitive Status: Impaired/Different from baseline Area of Impairment: Safety/judgement;Following commands;Memory                   Current Attention Level: Sustained Memory: Decreased short-term memory;Decreased recall of precautions Following Commands: Follows one step commands with increased time;Follows one step commands consistently Safety/Judgement: Decreased awareness of safety     General Comments: Pt more receptive to commands but impulsive and unaware of her deficits.  reports her husband is going to give her a bath so she can go home.          Exercises     Shoulder Instructions       General Comments VSS thoughout session    Pertinent Vitals/ Pain       Pain Assessment: Faces Faces Pain Scale: No hurt Pain Location: generalized discomfort Pain Intervention(s): Monitored during session;Repositioned  Home Living                                          Prior Functioning/Environment              Frequency  Min 2X/week        Progress Toward Goals  OT Goals(current goals can now be found in the care plan section)  Progress towards OT goals: Progressing toward goals  Acute Rehab OT Goals Patient Stated Goal: to get a bath OT Goal Formulation: With patient Time For Goal Achievement: 08/17/17 Potential to Achieve Goals: Good  Plan Discharge plan remains appropriate;Frequency remains appropriate    Co-evaluation    PT/OT/SLP Co-Evaluation/Treatment: Yes Reason for Co-Treatment: Complexity of the patient's impairments (multi-system involvement);For patient/therapist safety;Necessary to address cognition/behavior during functional activity;To address functional/ADL transfers;Other (comment)(Clinical decision of CIR vs LTACH) PT goals addressed during session: Mobility/safety with mobility;Balance OT goals addressed during session: ADL's and self-care       AM-PAC PT "6 Clicks" Daily Activity     Outcome Measure   Help from another person eating meals?: Total Help from another person taking care of personal grooming?: A Lot Help from another person toileting, which includes using toliet, bedpan, or urinal?: Total Help from another person bathing (including washing, rinsing, drying)?: Total Help from another person to put on and taking off regular upper body clothing?: Total Help from another person to put on and taking off regular lower body clothing?: Total 6 Click Score: 7    End of Session Equipment Utilized During Treatment: Oxygen(98% FIO2 10L)  OT Visit Diagnosis: Unsteadiness on feet (R26.81);Pain;Muscle weakness (generalized) (M62.81);Other symptoms and signs involving cognitive function   Activity Tolerance Patient tolerated treatment well(Pt continues to be so excited to work with therapy)   Patient Left with call bell/phone within reach;in chair;with chair alarm set;with family/visitor present   Nurse Communication Mobility status        Time: 2878-6767 OT Time Calculation (min): 29 min  Charges: OT General Charges $OT Visit: 1 Visit OT Treatments $Self Care/Home Management : 8-22 mins  Hulda Humphrey OTR/L [place] 08/03/2017, 3:43 PM

## 2017-08-03 NOTE — Progress Notes (Signed)
Physical Therapy Treatment Patient Details Name: [name] MRN: [id number] DOB: June 27, 1941 Today's Date: 08/03/2017    History of Present Illness Pt admitted 07/10/17 for AVR and ascending AA replacement. Post procedure had VF arrest. 07/11/17 back to OR to evacuate hematoma and place RVAD due to fluid overload and R systolic HF. RVAD subsequently removed. Pt intubated from 07/10/17-07/19/17. Re-intubated 1/24-1/29; trach placed 1/29. PMH: PVD, afib, vfib, hypothyroidism, anxiety and depression, HTN, HLD, R TKA.    PT Comments    Pt performed bed to chair transfer with progression of short shuffling steps.  Pt more alert and conversive with application of PMV.  Plan next session for sit to stand transfers with eva walker.  Pt remains an excellent candidate for CIR therapies at this time.    Follow Up Recommendations  CIR     Equipment Recommendations  (TBD at next venue)    Recommendations for Other Services Rehab consult     Precautions / Restrictions Precautions Precautions: Fall;Sternal Precaution Comments: reinforced sternal precautions during bed mobility Restrictions Weight Bearing Restrictions: No Other Position/Activity Restrictions: sternal precautions    Mobility  Bed Mobility Overal bed mobility: Needs Assistance Bed Mobility: Supine to Sit     Supine to sit: Max assist;+2 for physical assistance     General bed mobility comments: Cues for hand placement to comply with sternal precautions.  Pt with attempt and active participation to move LEs to edge of bed with improved success but remains to require assistance.  Use of bed pad to scoot to edge and assistance required for trunk elevation.   Transfers Overall transfer level: Needs assistance Equipment used: 2 person hand held assist Transfers: Sit to/from Stand Sit to Stand: +2 physical assistance;Max assist         General transfer comment: Pt able to stand upright with cues for upper trunk control, Pt  performed shuffling pivot steps from bed to chair with increased assistance to rotate hips.    Ambulation/Gait Ambulation/Gait assistance: Max assist;+2 physical assistance;Total assist(shuffling steps from bed to chair.  Max +2 initially required total +2 to complete steps.  )   Assistive device: 2 person hand held assist Gait Pattern/deviations: Shuffle     General Gait Details: No safe to advance gait training further at this time then steps to chair   Stairs            Wheelchair Mobility    Modified Rankin (Stroke Patients Only)       Balance     Sitting balance-Leahy Scale: Poor       Standing balance-Leahy Scale: Poor                              Cognition Arousal/Alertness: Lethargic Behavior During Therapy: WFL for tasks assessed/performed;Impulsive Overall Cognitive Status: Impaired/Different from baseline Area of Impairment: Safety/judgement;Following commands;Memory                     Memory: Decreased short-term memory;Decreased recall of precautions Following Commands: Follows one step commands with increased time;Follows one step commands consistently Safety/Judgement: Decreased awareness of safety     General Comments: Pt more receptive to commands but impulsive and unaware of her deficits.  reports her husband is going to give her a bath so she can go home.        Exercises      General Comments        Pertinent Vitals/Pain Pain  Assessment: No/denies pain Pain Location: generalized discomfort    Home Living                      Prior Function            PT Goals (current goals can now be found in the care plan section) Acute Rehab PT Goals Patient Stated Goal: to sit up Potential to Achieve Goals: Good Progress towards PT goals: Progressing toward goals    Frequency    Min 3X/week      PT Plan Current plan remains appropriate    Co-evaluation PT/OT/SLP Co-Evaluation/Treatment:  Yes Reason for Co-Treatment: Complexity of the patient's impairments (multi-system involvement);Necessary to address cognition/behavior during functional activity;For patient/therapist safety PT goals addressed during session: Mobility/safety with mobility;Balance OT goals addressed during session: ADL's and self-care;Proper use of Adaptive equipment and DME      AM-PAC PT "6 Clicks" Daily Activity  Outcome Measure  Difficulty turning over in bed (including adjusting bedclothes, sheets and blankets)?: Unable Difficulty moving from lying on back to sitting on the side of the bed? : Unable Difficulty sitting down on and standing up from a chair with arms (e.g., wheelchair, bedside commode, etc,.)?: Unable Help needed moving to and from a bed to chair (including a wheelchair)?: A Lot Help needed walking in hospital room?: Total Help needed climbing 3-5 steps with a railing? : Total 6 Click Score: 7    End of Session Equipment Utilized During Treatment: Gait belt;Oxygen Activity Tolerance: Patient tolerated treatment well;Patient limited by fatigue Patient left: in bed;with nursing/sitter in room;with family/visitor present;with call bell/phone within reach Nurse Communication: Mobility status;Need for lift equipment PT Visit Diagnosis: Other abnormalities of gait and mobility (R26.89);Muscle weakness (generalized) (M62.81)     Time: 5027-7412 PT Time Calculation (min) (ACUTE ONLY): 26 min  Charges:  $Therapeutic Activity: 8-22 mins                    G Codes:       Governor Rooks, PTA pager [phone]    Cristela Blue 08/03/2017, 2:45 PM

## 2017-08-03 NOTE — Progress Notes (Signed)
17 Days Post-Op Procedure(s) (LRB): REMOVAL OF RVAD WITH PUMP STANDBY (N/A) TRANSESOPHAGEAL ECHOCARDIOGRAM (TEE) (N/A) Subjective: Slept better- more alert this am Wants to eat and get OOB Now off vent but needs trach for secretions, PO2 52 Since off vent and with her insurance CIR is the goal   Objective: Vital signs in last 24 hours: Temp:  [97.7 F (36.5 C)-99.4 F (37.4 C)] 98 F (36.7 C) (02/07 0344) Pulse Rate:  [88-95] 88 (02/07 0700) Cardiac Rhythm: Normal sinus rhythm (02/07 0400) Resp:  [16-32] 24 (02/07 0700) BP: (105-134)/(59-88) 123/71 (02/07 0700) SpO2:  [90 %-99 %] 96 % (02/07 0700) FiO2 (%):  [40 %-60 %] 40 % (02/07 0733) Weight:  [179 lb 7.3 oz (81.4 kg)] 179 lb 7.3 oz (81.4 kg) (02/07 0500)  Hemodynamic parameters for last 24 hours:  nsr  Intake/Output from previous day: 02/06 0701 - 02/07 0700 In: 1900 [NG/GT:1600; IV Piggyback:300] Out: 850 [Urine:850] Intake/Output this shift: No intake/output data recorded.       Exam    General- alert and comfortable    Neck- no JVD, no cervical adenopathy palpable, no carotid bruit   Lungs- clear without rales, wheezes   Cor- regular rate and rhythm, no murmur , gallop   Abdomen- soft, non-tender   Extremities - warm, non-tender, minimal edema   Neuro- oriented, appropriate, no focal weakness   Lab Results: Recent Labs    08/01/17 0339 08/01/17 1620  WBC 10.0  --   HGB 12.2 13.6  HCT 41.9 40.0  PLT 453*  --    BMET:  Recent Labs    08/01/17 0339 08/01/17 1620 08/03/17 0548  NA 148* 149* 145  K 3.8 3.2* 4.0  CL 104 104 105  CO2 31  --  26  GLUCOSE 124* 121* 160*  BUN 32* 31* 33*  CREATININE 0.62 0.50 0.67  CALCIUM 8.2*  --  8.0*    PT/INR: No results for input(s): LABPROT, INR in the last 72 hours. ABG    Component Value Date/Time   PHART 7.502 (H) 08/03/2017 0347   HCO3 27.4 08/03/2017 0347   TCO2 28 08/03/2017 0347   ACIDBASEDEF 1.0 07/18/2017 0404   O2SAT 90.0 08/03/2017 0347    CBG (last 3)  Recent Labs    08/02/17 2342 08/03/17 0342 08/03/17 0743  GLUCAP 137* 116* 94    Assessment/Plan: S/P Procedure(s) (LRB): REMOVAL OF RVAD WITH PUMP STANDBY (N/A) TRANSESOPHAGEAL ECHOCARDIOGRAM (TEE) (N/A) Mobilize Swallow eval and therapy Cont trach- not ready to wean   LOS: 24 days    [name] 08/03/2017

## 2017-08-03 NOTE — Progress Notes (Signed)
      [address]       [place] [address]             [phone]      Stable day. Voice strong with Caryl Never valve Continue current care  Remo Lipps C. Roxan Hockey, MD [facility] [phone]

## 2017-08-04 DIAGNOSIS — L8990 Pressure ulcer of unspecified site, unspecified stage: Secondary | ICD-10-CM

## 2017-08-04 LAB — BASIC METABOLIC PANEL
Anion gap: 11 (ref 5–15)
BUN: 32 mg/dL — ABNORMAL HIGH (ref 6–20)
CO2: 26 mmol/L (ref 22–32)
Calcium: 8 mg/dL — ABNORMAL LOW (ref 8.9–10.3)
Chloride: 107 mmol/L (ref 101–111)
Creatinine, Ser: 0.59 mg/dL (ref 0.44–1.00)
GFR calc Af Amer: >60 mL/min (ref >60)
GFR calc non Af Amer: >60 mL/min (ref >60)
Glucose, Bld: 136 mg/dL — ABNORMAL HIGH (ref 65–99)
Potassium: 4.3 mmol/L (ref 3.5–5.1)
Sodium: 144 mmol/L (ref 135–145)

## 2017-08-04 LAB — GLUCOSE, CAPILLARY
Glucose-Capillary: 92 mg/dL (ref 65–99)
Glucose-Capillary: 196 mg/dL — ABNORMAL HIGH (ref 65–99)
Glucose-Capillary: 107 mg/dL — ABNORMAL HIGH (ref 65–99)
Glucose-Capillary: 96 mg/dL (ref 65–99)
Glucose-Capillary: 105 mg/dL — ABNORMAL HIGH (ref 65–99)
Glucose-Capillary: 101 mg/dL — ABNORMAL HIGH (ref 65–99)

## 2017-08-04 NOTE — Progress Notes (Signed)
..  Name: [name] MRN: [id number] DOB: December 10, 1940    ADMISSION DATE:  07/10/2017  CONSULTATION DATE:  07/12/17  REFERRING MD :  Nils Pyle MD   BRIEF:   77 year old female with past medical history significant for fusiform ascending aortic aneurysm, hypertension, hyperlipidemia, status post AVR and replacement of ascending aortic aneurysm.  On 07/10/2017 postprocedure patient developed acute cardiogenic shock had an episode of V. fib arrest was taken back to the OR on 07/11/2017 for evacuation of hematoma and placement of RVAD. She had prolonged mechanical ventilation requiring tracheostomy.   SUBJECTIVE:   No events overnight, tolerated TC changes without difficulty.   VITAL SIGNS: Temp:  [97.8 F (36.6 C)-99 F (37.2 C)] 98.3 F (36.8 C) (02/08 0725) Pulse Rate:  [88-98] 98 (02/08 0800) Resp:  [13-32] 24 (02/08 0800) BP: (101-137)/(61-114) 121/83 (02/08 0800) SpO2:  [95 %-99 %] 96 % (02/08 0800) FiO2 (%):  [40 %] 40 % (02/08 0800) Weight:  [80 kg (176 lb 5.9 oz)] 80 kg (176 lb 5.9 oz) (02/08 0344)   Filed Weights   08/02/17 0500 08/03/17 0500 08/04/17 0344  Weight: 80.1 kg (176 lb 9.4 oz) 81.4 kg (179 lb 7.3 oz) 80 kg (176 lb 5.9 oz)   Intake/Output Summary (Last 24 hours) at 08/04/2017 4818 Last data filed at 08/04/2017 0800 Gross per 24 hour  Intake 1240 ml  Output 920 ml  Net 320 ml   PHYSICAL EXAMINATION: General: Chronically ill appearing female, NAD, speaking with PMV HEENT: Trach in place, cuffless 6, sutures out. Neuro: Alert and interactive, moving all ext to command CV: RRR, Nl S1/S2 and -M/R/G PULM: Coarse BS diffusely GI: Soft, NT, ND and +BS Extremities: Bilateral edema improving Skin: No rash  PULMONARY  CBC Recent Labs  Lab 07/30/17 0703  07/31/17 0427 07/31/17 1709 08/01/17 0339 08/01/17 1620  HGB 11.9*   < > 11.8* 13.9 12.2 13.6  HCT 39.5   < > 40.4 41.0 41.9 40.0  WBC 9.6  --  9.0  --  10.0  --   PLT 570*  --  471*  --  453*  --    <  > = values in this interval not displayed.    COAGULATION No results for input(s): INR in the last 168 hours.  CARDIAC  No results for input(s): TROPONINI in the last 168 hours. No results for input(s): PROBNP in the last 168 hours.   CHEMISTRY Recent Labs  Lab 07/30/17 0142  07/31/17 0427 07/31/17 1709 08/01/17 0339 08/01/17 1620 08/03/17 0548 08/04/17 0139  NA 144   < > 147* 148* 148* 149* 145 144  K 3.2*   < > 3.8 3.8 3.8 3.2* 4.0 4.3  CL 97*   < > 103 103 104 104 105 107  CO2 34*  --  33*  --  31  --  26 26  GLUCOSE 124*   < > 132* 110* 124* 121* 160* 136*  BUN 26*   < > 31* 31* 32* 31* 33* 32*  CREATININE 0.60   < > 0.67 0.50 0.62 0.50 0.67 0.59  CALCIUM 7.7*  --  8.0*  --  8.2*  --  8.0* 8.0*  MG 2.0  --   --   --   --   --  2.2  --    < > = values in this interval not displayed.   Estimated Creatinine Clearance: 63.5 mL/min (by C-G formula based on SCr of 0.59 mg/dL).   LIVER  Recent Labs  Lab 07/29/17 0305  AST 104*  ALT 61*  ALKPHOS 178*  BILITOT 1.2  PROT 5.9*  ALBUMIN 2.9*    INFECTIOUS No results for input(s): LATICACIDVEN, PROCALCITON in the last 168 hours.  ENDOCRINE CBG (last 3)  Recent Labs    08/03/17 2337 08/04/17 0313 08/04/17 0748  GLUCAP 104* 92 196*     IMAGING  Dg Chest Port 1 View  Result Date: 08/03/2017 CLINICAL DATA:  Increasing shortness of Breath EXAM: PORTABLE CHEST 1 VIEW COMPARISON:  08/01/2017 FINDINGS: Tracheostomy tube, right-sided PICC line and feeding catheter are again noted and stable. The overall inspiratory effort is improved. Some residual basilar atelectatic changes are noted on the left. No significant vascular congestion is noted. Postsurgical changes are again seen. IMPRESSION: Mild left basilar atelectasis. Electronically Signed   By: Inez Catalina M.D.   On: 08/03/2017 09:08   STUDIES: Echo12/12/2016 LVEF 55-60%, Grade 1 DD, Mild/Mod AI with LVOT, Mid ascending aortic diameter 48.88 mm, Mild LAE, RV  normal Echo (07/22/17): EF 65-70%, bioprosthetic aortic valve functioning normally, PASP 33 mmHg, RV looks ok.   CULTURES 1/24 sputum >> oral flora  ANTIBIOTICS Ceftaz1/17 >> 1.20 Cefuroxime 1/21 >> 1/23 Ceftaz 1/23 >> 1/31  EVENTS 1/14  Admit 1/14 S/p AVR and ascending AA replacement  1/14 VF Arrest in evening with shock x 1 1/15 to OR for evacuation of hematoma and RVAD placement  1/16 PCCM consult 1/19 Lasix per cardiology,  CVP of 5 1/21 RVAD removed 1/22 heavily diuresed  1/23 ready for extubation. Atrial fib amio started.  1/24 reintubated 1/25  Afebrile 1/29  trach placed 2/01  Tolerating PMV with SLP   I reviewed CXR myself, trach is in good position  ASSESSMENT / PLAN: 77 year old female with PMHx significant for fusiform ascending aortic aneurysm, hypertension, hyperlipidemia, status post AVR and replacement of ascending aortic aneurysm.  On 07/10/2017 postprocedure patient developed acute cardiogenic shock had an episode of V. fib arrest was taken back to the OR on 07/11/2017 for evacuation of hematoma and placement of RVAD.  RVAD now removed, failed extubation 1/24 but continued to wean well. Underwent tracheostomy 1/29.  Course complicated by agitated delirium > improving slowly.   Discussed with PCCM-NP and bedside RN  ASSESSMENT / PLAN:  Acute hypoxic respiratory failure - initially in setting of cardiogenic shock, resolving Tracheostomy Status - 1/29 P: Continue ATC as tol - off vent since 2/3 am Maintain 6 cuffless trach until stronger then can change to 4 moving towards decannulation Mobilize, pulmonary hygiene, PT PMV pwer SLP Garlon Hatchet, Pulmicort Control agitation as below  HCAP/VAP, treated P: Monitor WBC and fever curve off abx  Cardiogenic shock - s/p removal of RVAD 1/21; sternum now closed Atrial fibrillation Medication related hypotension.  Back on levophed since started on propofol VF arrest, brief, immediately post op - no further  incidence. Prolonged qT P: Per cards/CHF/CVTS  Amiodarone D/C milrinone 2/3 Scheduled spironolactone  Hypothyroidism P: Continue Synthroid  Hyperglycemia P: Scheduled Levemir Insulin per sliding scale  Constipation P: Senokot QD, hold for diarrhea Dulcolax Mobilize  Acute Encephalopathy Depression Agitated Delirium -  improving P: Trying to wean clonazepam/seroquel decreased to 0.25 daily at this point and 1 QHS Dropped Seroquel 75 nightly Scheduled Effexor Haldol available as needed NO ambien   PCCM will follow for trach management.  Rush Farmer, M.D. [facility] Pulmonary/Critical Care Medicine. Pager: [phone]. After hours pager: [phone].

## 2017-08-04 NOTE — Progress Notes (Signed)
Pt seen by trach team for consult. No education needed at this time. All necessary equipment available at bedside. Will continue to monitor for progress. 

## 2017-08-04 NOTE — Progress Notes (Signed)
Physical Therapy Treatment Patient Details Name: [name] MRN: [id number] DOB: April 11, 1941 Today's Date: 08/04/2017    History of Present Illness Pt admitted 07/10/17 for AVR and ascending AA replacement. Post procedure had VF arrest. 07/11/17 back to OR to evacuate hematoma and place RVAD due to fluid overload and R systolic HF. RVAD subsequently removed. Pt intubated from 07/10/17-07/19/17. Re-intubated 1/24-1/29; trach placed 1/29. PMH: PVD, afib, vfib, hypothyroidism, anxiety and depression, HTN, HLD, R TKA.    PT Comments    Pt performed multiple standing trials with AD and progressed to taking steps from bed to chair in a more upright manner.  Pt remains flexed in hips and knees begin to flex with fatigue but she continues to make progress given her complicated medical history.  Plan next session for progression of gait training with a close chair follow next session.    Follow Up Recommendations  CIR     Equipment Recommendations  (TBD at next venue)    Recommendations for Other Services Rehab consult     Precautions / Restrictions Precautions Precautions: Fall;Sternal Precaution Comments: reinforced sternal precautions during bed mobility Restrictions Weight Bearing Restrictions: No Other Position/Activity Restrictions: sternal precautions    Mobility  Bed Mobility Overal bed mobility: Needs Assistance Bed Mobility: Supine to Sit     Supine to sit: Max assist;+2 for physical assistance     General bed mobility comments: Cues for hand placement to comply with sternal precautions.  Pt with attempt and active participation to move LEs to edge of bed with improved success but remains to require assistance.  Use of bed pad to scoot to edge and assistance required for trunk elevation.   Transfers Overall transfer level: Needs assistance Equipment used: 4-wheeled walker(EVA walker) Transfers: Sit to/from Stand Sit to Stand: +2 physical assistance;Max assist          General transfer comment: Pt able to stand upright with cues for upper trunk control, Pt performed increased standing trials x 4 with flexed hips and upper trunk control.  Pt required max VCs to place hands on her knees to comply with sternal precautions.    Ambulation/Gait Ambulation/Gait assistance: Max assist;+2 physical assistance(+3 for equipment) Ambulation Distance (Feet): 4 Feet(steps from bed to recliner) Assistive device: 4-wheeled walker(EVA walker) Gait Pattern/deviations: Shuffle;Trunk flexed;Decreased stride length   Gait velocity interpretation: Below normal speed for age/gender General Gait Details: Pt performed gait with improve upper trunk with use of platform EVA walker but remains to present with flexed hips and knees in stance as she fatigues.  Pt performed steps from bed to recliner before she began to sit impulisvely.     Stairs            Wheelchair Mobility    Modified Rankin (Stroke Patients Only)       Balance Overall balance assessment: Needs assistance   Sitting balance-Leahy Scale: Poor       Standing balance-Leahy Scale: Poor Standing balance comment: dependent on external support provided by therapists                            Cognition Arousal/Alertness: Awake/alert Behavior During Therapy: WFL for tasks assessed/performed;Impulsive Overall Cognitive Status: Impaired/Different from baseline Area of Impairment: Safety/judgement;Following commands;Memory;Attention;Orientation;Problem solving                     Memory: Decreased short-term memory;Decreased recall of precautions Following Commands: Follows one step commands with increased time;Follows one  step commands consistently Safety/Judgement: Decreased awareness of safety     General Comments: Pt more receptive to commands but impulsive and unaware of her deficits.  reports her husband is going to give her a bath so she can go home.        Exercises       General Comments        Pertinent Vitals/Pain Pain Assessment: No/denies pain    Home Living                      Prior Function            PT Goals (current goals can now be found in the care plan section) Acute Rehab PT Goals Patient Stated Goal: to get a bath Potential to Achieve Goals: Good Progress towards PT goals: Progressing toward goals    Frequency    Min 3X/week      PT Plan Current plan remains appropriate    Co-evaluation PT/OT/SLP Co-Evaluation/Treatment: Yes Reason for Co-Treatment: Complexity of the patient's impairments (multi-system involvement);Necessary to address cognition/behavior during functional activity;For patient/therapist safety PT goals addressed during session: Mobility/safety with mobility;Balance OT goals addressed during session: ADL's and self-care;Proper use of Adaptive equipment and DME      AM-PAC PT "6 Clicks" Daily Activity  Outcome Measure  Difficulty turning over in bed (including adjusting bedclothes, sheets and blankets)?: Unable Difficulty moving from lying on back to sitting on the side of the bed? : Unable Difficulty sitting down on and standing up from a chair with arms (e.g., wheelchair, bedside commode, etc,.)?: Unable Help needed moving to and from a bed to chair (including a wheelchair)?: A Lot Help needed walking in hospital room?: A Lot Help needed climbing 3-5 steps with a railing? : Total 6 Click Score: 8    End of Session Equipment Utilized During Treatment: Gait belt;Oxygen Activity Tolerance: Patient tolerated treatment well;Patient limited by fatigue Patient left: in bed;with nursing/sitter in room;with family/visitor present;with call bell/phone within reach Nurse Communication: Mobility status;Need for lift equipment(maxisky lift placed under patient.  ) PT Visit Diagnosis: Other abnormalities of gait and mobility (R26.89);Muscle weakness (generalized) (M62.81)     Time: 3614-4315 PT Time  Calculation (min) (ACUTE ONLY): 24 min  Charges:  $Therapeutic Activity: 8-22 mins                    G Codes:       Governor Rooks, PTA pager [phone]    Cristela Blue 08/04/2017, 1:41 PM

## 2017-08-04 NOTE — Progress Notes (Signed)
PT Cancellation Note  Patient Details Name: [name] MRN: [id number] DOB: 1941/02/19   Cancelled Treatment:    Reason Eval/Treat Not Completed: (P) Other (comment)(RN deferred tx at this time and reports patient has just returned to bed.  Will f/u this afternoon.  )   Cecilee Rosner Eli Hose 08/04/2017, 10:29 AM  Governor Rooks, PTA pager [phone]

## 2017-08-04 NOTE — Progress Notes (Signed)
Occupational Therapy Treatment Patient Details Name: [name] MRN: [id number] DOB: [birth year] Today's Date: 08/04/2017    History of present illness Pt admitted 07/10/17 for AVR and ascending AA replacement. Post procedure had VF arrest. 07/11/17 back to OR to evacuate hematoma and place RVAD due to fluid overload and R systolic HF. RVAD subsequently removed. Pt intubated from 07/10/17-07/19/17. Re-intubated 1/24-1/29; trach placed 1/29. PMH: PVD, afib, vfib, hypothyroidism, anxiety and depression, HTN, HLD, R TKA.   OT comments  Pt continues to be very motivated and excited to work with therapy. Session does at a co-tx as Pt was unavailable this morning - but believe that Pt can and will tolerate multiple sessions. This session focused on sternal precaution education, multiple sit to stands from the bed during peri care (with use of EVA walker) and then steps to recliner. Pt participated in seated grooming EOB prior to transfer demonstrating increased ROM for UB ADL. Pt continues to benefit from skilled OT in the acute setting and CIR remains essential for Pt's return to PLOF.   Follow Up Recommendations  CIR    Equipment Recommendations  3 in 1 bedside commode    Recommendations for Other Services Rehab consult    Precautions / Restrictions Precautions Precautions: Fall;Sternal Precaution Comments: reindorced sternal precautions Restrictions Weight Bearing Restrictions: No Other Position/Activity Restrictions: sternal precautions       Mobility Bed Mobility Overal bed mobility: Needs Assistance Bed Mobility: Supine to Sit Rolling: Max assist;+2 for physical assistance Sidelying to sit: Max assist;+2 for physical assistance;HOB elevated Supine to sit: Max assist;+2 for physical assistance     General bed mobility comments: Cues for hand placement to comply with sternal precautions.  Pt with attempt and active participation to move LEs to edge of bed with improved success but  remains to require assistance.  Use of bed pad to scoot to edge and assistance required for trunk elevation.   Transfers Overall transfer level: Needs assistance Equipment used: 4-wheeled walker(EVA walker) Transfers: Sit to/from Stand Sit to Stand: +2 physical assistance;Max assist   Squat pivot transfers: Max assist;+2 safety/equipment;+2 physical assistance     General transfer comment: Pt able to stand upright with cues for upper trunk control, Pt performed increased standing trials x 4 with flexed hips and upper trunk control.  Pt required max VCs to place hands on her knees to comply with sternal precautions.      Balance Overall balance assessment: Needs assistance Sitting-balance support: Feet supported;Bilateral upper extremity supported Sitting balance-Leahy Scale: Poor Sitting balance - Comments: Pt required intermittent min to mod assist to maintain sitting.  Pt performed limited reaching activity with hand over hand assistance to midline of body.   Postural control: Posterior lean   Standing balance-Leahy Scale: Poor Standing balance comment: dependent on external support provided by therapists and DME                           ADL either performed or assessed with clinical judgement   ADL Overall ADL's : Needs assistance/impaired Eating/Feeding: NPO   Grooming: Wash/dry face;Sitting;Moderate assistance;Cueing for sequencing Grooming Details (indicate cue type and reason): Pt able to bring hand to face and wash eyes with wash cloth.                 Toilet Transfer: Maximal assistance;+2 for physical assistance;+2 for safety/equipment;Ambulation(EVA walker) Toilet Transfer Details (indicate cue type and reason): simualted through recliner transfer Toileting- Clothing Manipulation and Hygiene:  Total assistance Toileting - Clothing Manipulation Details (indicate cue type and reason): during sit to stand from bed, RN performed rear peri care      Functional mobility during ADLs: Maximal assistance;+2 for physical assistance;+2 for safety/equipment;Cueing for sequencing;Cueing for safety       Vision   Additional Comments: will further assess - tracks appropriately this session   Perception     Praxis      Cognition Arousal/Alertness: Awake/alert Behavior During Therapy: [facility] for tasks assessed/performed;Impulsive Overall Cognitive Status: Impaired/Different from baseline Area of Impairment: Safety/judgement;Following commands;Memory;Attention;Orientation;Problem solving                   Current Attention Level: Sustained Memory: Decreased short-term memory;Decreased recall of precautions Following Commands: Follows one step commands with increased time;Follows one step commands consistently Safety/Judgement: Decreased awareness of safety   Problem Solving: Slow processing;Decreased initiation;Requires verbal cues General Comments: Pt talking nonsensical at times (tangential thoughts) but able to follow commands 50% of the time for grooming and transfers        Exercises     Shoulder Instructions       General Comments VSS throughout session    Pertinent Vitals/ Pain       Pain Assessment: No/denies pain Pain Intervention(s): Monitored during session  Home Living                                          Prior Functioning/Environment              Frequency  Min 2X/week        Progress Toward Goals  OT Goals(current goals can now be found in the care plan section)  Progress towards OT goals: Progressing toward goals  Acute Rehab OT Goals Patient Stated Goal: to get a bath OT Goal Formulation: With patient Time For Goal Achievement: 08/17/17 Potential to Achieve Goals: Good  Plan Discharge plan remains appropriate;Frequency remains appropriate    Co-evaluation    PT/OT/SLP Co-Evaluation/Treatment: Yes Reason for Co-Treatment: Complexity of the patient's  impairments (multi-system involvement);For patient/therapist safety;To address functional/ADL transfers;Necessary to address cognition/behavior during functional activity PT goals addressed during session: Mobility/safety with mobility;Balance;Strengthening/ROM OT goals addressed during session: ADL's and self-care;Strengthening/ROM;Proper use of Adaptive equipment and DME      AM-PAC PT "6 Clicks" Daily Activity     Outcome Measure   Help from another person eating meals?: Total Help from another person taking care of personal grooming?: A Lot Help from another person toileting, which includes using toliet, bedpan, or urinal?: Total Help from another person bathing (including washing, rinsing, drying)?: Total Help from another person to put on and taking off regular upper body clothing?: Total Help from another person to put on and taking off regular lower body clothing?: Total 6 Click Score: 7    End of Session Equipment Utilized During Treatment: Oxygen  OT Visit Diagnosis: Unsteadiness on feet (R26.81);Muscle weakness (generalized) (M62.81);Other symptoms and signs involving cognitive function   Activity Tolerance Patient tolerated treatment well   Patient Left with call bell/phone within reach;in chair;with chair alarm set;with nursing/sitter in room   Nurse Communication Mobility status        Time: 8756-4332 OT Time Calculation (min): 23 min  Charges: OT General Charges $OT Visit: 1 Visit OT Treatments $Self Care/Home Management : 8-22 mins  Hulda Humphrey OTR/L [place] 08/04/2017, 2:41  PM

## 2017-08-04 NOTE — Progress Notes (Signed)
Patient ID: [name], female   DOB: [birth year], 77 y.o.   MRN: [id number] EVENING ROUNDS NOTE :     [address]       [place] [address]             [phone]                 18 Days Post-Op Procedure(s) (LRB): REMOVAL OF RVAD WITH PUMP STANDBY (N/A) TRANSESOPHAGEAL ECHOCARDIOGRAM (TEE) (N/A)  Total Length of Stay:  LOS: 25 days  BP 118/64 (BP Location: Left Arm)   Pulse 93   Temp (!) 97.5 F (36.4 C) (Oral)   Resp (!) 24   Ht 5\' 5"  (1.651 m)   Wt 176 lb 5.9 oz (80 kg)   SpO2 100%   BMI 29.35 kg/m   .Intake/Output      02/08 0701 - 02/09 0700   NG/GT 1040   IV Piggyback    Total Intake(mL/kg) 1040 (13)   Urine (mL/kg/hr)    Stool    Total Output    Net +1040       Urine Occurrence 4 x   Stool Occurrence 4 x     . sodium chloride 250 mL (07/22/17 1820)  . feeding supplement (VITAL 1.5 CAL) 1,000 mL (08/04/17 0800)  . lactated ringers Stopped (07/17/17 1200)  . vancomycin Stopped (08/04/17 1133)     Lab Results  Component Value Date   WBC 10.0 08/01/2017   HGB 13.6 08/01/2017   HCT 40.0 08/01/2017   PLT 453 (H) 08/01/2017   GLUCOSE 136 (H) 08/04/2017   CHOL 165 03/15/2017   TRIG 530 (H) 07/23/2017   HDL 57 03/15/2017   LDLCALC 89 03/15/2017   ALT 61 (H) 07/29/2017   AST 104 (H) 07/29/2017   NA 144 08/04/2017   K 4.3 08/04/2017   CL 107 08/04/2017   CREATININE 0.59 08/04/2017   BUN 32 (H) 08/04/2017   CO2 26 08/04/2017   TSH 0.990 03/15/2017   INR 1.24 07/25/2017   HGBA1C 5.2 07/06/2017   Stable day Some progress with PT   Grace Isaac MD  Beeper [phone] Office [phone] 08/04/2017 8:39 PM

## 2017-08-04 NOTE — Progress Notes (Signed)
18 Days Post-Op Procedure(s) (LRB): REMOVAL OF RVAD WITH PUMP STANDBY (N/A) TRANSESOPHAGEAL ECHOCARDIOGRAM (TEE) (N/A) Subjective: Getting stronger CXR clear nsr- no CHF problems Cont trach collar- secretions still significant SLT working on swallow fx and PM valve OOB to chair daily Objective: Vital signs in last 24 hours: Temp:  [97.8 F (36.6 C)-99 F (37.2 C)] 98.3 F (36.8 C) (02/08 0725) Pulse Rate:  [88-98] 98 (02/08 0800) Cardiac Rhythm: Normal sinus rhythm (02/08 0800) Resp:  [13-32] 24 (02/08 0800) BP: (101-137)/(61-114) 121/83 (02/08 0800) SpO2:  [95 %-99 %] 96 % (02/08 0800) FiO2 (%):  [40 %] 40 % (02/08 0800) Weight:  [176 lb 5.9 oz (80 kg)] 176 lb 5.9 oz (80 kg) (02/08 0344)  Hemodynamic parameters for last 24 hours:  stable  Intake/Output from previous day: 02/07 0701 - 02/08 0700 In: 750 [NG/GT:600; IV Piggyback:150] Out: 920 [Urine:920] Intake/Output this shift: Total I/O In: 640 [NG/GT:640] Out: -        Exam    General- alert and comfortable    Neck- no JVD, no cervical adenopathy palpable, no carotid bruit   Lungs- clear without rales, wheezes   Cor- regular rate and rhythm, no murmur , gallop   Abdomen- soft, non-tender   Extremities - warm, non-tender, minimal edema   Neuro- oriented, appropriate, no focal weakness   Lab Results: Recent Labs    08/01/17 1620  HGB 13.6  HCT 40.0   BMET:  Recent Labs    08/03/17 0548 08/04/17 0139  NA 145 144  K 4.0 4.3  CL 105 107  CO2 26 26  GLUCOSE 160* 136*  BUN 33* 32*  CREATININE 0.67 0.59  CALCIUM 8.0* 8.0*    PT/INR: No results for input(s): LABPROT, INR in the last 72 hours. ABG    Component Value Date/Time   PHART 7.502 (H) 08/03/2017 0347   HCO3 27.4 08/03/2017 0347   TCO2 28 08/03/2017 0347   ACIDBASEDEF 1.0 07/18/2017 0404   O2SAT 90.0 08/03/2017 0347   CBG (last 3)  Recent Labs    08/03/17 2337 08/04/17 0313 08/04/17 0748  GLUCAP 104* 92 196*     Assessment/Plan: S/P Procedure(s) (LRB): REMOVAL OF RVAD WITH PUMP STANDBY (N/A) TRANSESOPHAGEAL ECHOCARDIOGRAM (TEE) (N/A) Keep in ICU until she can safely walk with assist x1 Start po diet when safe per SLT  LOS: 25 days    [name] 08/04/2017

## 2017-08-04 NOTE — Progress Notes (Signed)
20 mL fentanyl wasted in sink. Edilia Bo RN verified dose wasted.

## 2017-08-04 NOTE — Progress Notes (Signed)
SLP Cancellation Note  Patient Details Name: [name] MRN: [id number] DOB: 08-Aug-1940   Cancelled treatment:        Pt not feeling good today, lethargic. Defer objective swallow assessment Will check first to next week   [name] 08/04/2017, 1:52 PM

## 2017-08-04 NOTE — Progress Notes (Signed)
Inpatient Rehabilitation  Continuing to follow for timing of medical readiness, therapy tolerance, insurance authorization, and IP Rehab bed availability.  Plan to follow up Monday, 08/07/17.  Call if questions.   Carmelia Roller., CCC/SLP Admission Coordinator  [facility]  Cell [phone]

## 2017-08-05 ENCOUNTER — Inpatient Hospital Stay (HOSPITAL_COMMUNITY): Payer: Medicare Other

## 2017-08-05 LAB — GLUCOSE, CAPILLARY
Glucose-Capillary: 89 mg/dL (ref 65–99)
Glucose-Capillary: 123 mg/dL — ABNORMAL HIGH (ref 65–99)
Glucose-Capillary: 86 mg/dL (ref 65–99)
Glucose-Capillary: 98 mg/dL (ref 65–99)
Glucose-Capillary: 96 mg/dL (ref 65–99)
Glucose-Capillary: 103 mg/dL — ABNORMAL HIGH (ref 65–99)

## 2017-08-05 LAB — CBC
HCT: 40.3 % (ref 36.0–46.0)
Hemoglobin: 12 g/dL (ref 12.0–15.0)
MCH: 29.7 pg (ref 26.0–34.0)
MCHC: 29.8 g/dL — ABNORMAL LOW (ref 30.0–36.0)
MCV: 99.8 fL (ref 78.0–100.0)
Platelets: 324 10*3/uL (ref 150–400)
RBC: 4.04 MIL/uL (ref 3.87–5.11)
RDW: 16.5 % — ABNORMAL HIGH (ref 11.5–15.5)
WBC: 11 10*3/uL — ABNORMAL HIGH (ref 4.0–10.5)

## 2017-08-05 LAB — BASIC METABOLIC PANEL
Anion gap: 12 (ref 5–15)
BUN: 38 mg/dL — ABNORMAL HIGH (ref 6–20)
CO2: 27 mmol/L (ref 22–32)
Calcium: 7.6 mg/dL — ABNORMAL LOW (ref 8.9–10.3)
Chloride: 105 mmol/L (ref 101–111)
Creatinine, Ser: 0.65 mg/dL (ref 0.44–1.00)
GFR calc Af Amer: >60 mL/min (ref >60)
GFR calc non Af Amer: >60 mL/min (ref >60)
Glucose, Bld: 116 mg/dL — ABNORMAL HIGH (ref 65–99)
Potassium: 3.2 mmol/L — ABNORMAL LOW (ref 3.5–5.1)
Sodium: 144 mmol/L (ref 135–145)

## 2017-08-05 LAB — VANCOMYCIN, TROUGH: Vancomycin Tr: 16 ug/mL (ref 15–20)

## 2017-08-05 MED ORDER — VENLAFAXINE HCL 37.5 MG PO TABS
37.5000 mg | ORAL_TABLET | Freq: Two times a day (BID) | ORAL | Status: DC
Start: 2017-08-06 — End: 2017-08-12
  Administered 2017-08-06 – 2017-08-12 (×13): 37.5 mg via ORAL
  Filled 2017-08-05 (×15): qty 1

## 2017-08-05 MED ORDER — FUROSEMIDE 10 MG/ML IJ SOLN
40.0000 mg | Freq: Once | INTRAMUSCULAR | Status: AC
  Administered 2017-08-05: 40 mg via INTRAVENOUS
  Filled 2017-08-05: qty 4

## 2017-08-05 MED ORDER — POTASSIUM CHLORIDE 10 MEQ/50ML IV SOLN
10.0000 meq | INTRAVENOUS | Status: AC
  Administered 2017-08-05 (×2): 10 meq via INTRAVENOUS
  Filled 2017-08-05 (×2): qty 50

## 2017-08-05 MED ORDER — SENNOSIDES 8.8 MG/5ML PO SYRP
5.0000 mL | ORAL_SOLUTION | Freq: Two times a day (BID) | ORAL | Status: DC
  Administered 2017-08-05 – 2017-08-12 (×8): 5 mL via ORAL
  Filled 2017-08-05 (×9): qty 5

## 2017-08-05 MED ORDER — POTASSIUM CHLORIDE 10 MEQ/50ML IV SOLN
10.0000 meq | INTRAVENOUS | Status: AC | PRN
  Administered 2017-08-05 (×3): 10 meq via INTRAVENOUS
  Filled 2017-08-05 (×3): qty 50

## 2017-08-05 NOTE — Progress Notes (Signed)
19 Days Post-Op Procedure(s) (LRB): REMOVAL OF RVAD WITH PUMP STANDBY (N/A) TRANSESOPHAGEAL ECHOCARDIOGRAM (TEE) (N/A) Subjective: OOB to chair alert, stronger Feeding tube nonfunctional- lasix ordered IV Core track team to see   Objective: Vital signs in last 24 hours: Temp:  [97.5 F (36.4 C)-98.2 F (36.8 C)] 98.2 F (36.8 C) (02/09 0754) Pulse Rate:  [84-95] 92 (02/09 1000) Cardiac Rhythm: Normal sinus rhythm (02/09 1000) Resp:  [14-33] 29 (02/09 1000) BP: (90-135)/(51-87) 106/59 (02/09 1000) SpO2:  [94 %-100 %] 98 % (02/09 1000) FiO2 (%):  [28 %-40 %] 28 % (02/09 0800)  Hemodynamic parameters for last 24 hours:  nsr  Intake/Output from previous day: 02/08 0701 - 02/09 0700 In: 1770 [NG/GT:1520; IV Piggyback:250] Out: -  Intake/Output this shift: Total I/O In: 280 [NG/GT:180; IV Piggyback:100] Out: -   Incisions clean, dry  Lab Results: Recent Labs    08/05/17 0405  WBC 11.0*  HGB 12.0  HCT 40.3  PLT 324   BMET:  Recent Labs    08/04/17 0139 08/05/17 0405  NA 144 144  K 4.3 3.2*  CL 107 105  CO2 26 27  GLUCOSE 136* 116*  BUN 32* 38*  CREATININE 0.59 0.65  CALCIUM 8.0* 7.6*    PT/INR: No results for input(s): LABPROT, INR in the last 72 hours. ABG    Component Value Date/Time   PHART 7.502 (H) 08/03/2017 0347   HCO3 27.4 08/03/2017 0347   TCO2 28 08/03/2017 0347   ACIDBASEDEF 1.0 07/18/2017 0404   O2SAT 90.0 08/03/2017 0347   CBG (last 3)  Recent Labs    08/04/17 1937 08/04/17 2325 08/05/17 0753  GLUCAP 105* 101* 123*    Assessment/Plan: S/P Procedure(s) (LRB): REMOVAL OF RVAD WITH PUMP STANDBY (N/A) TRANSESOPHAGEAL ECHOCARDIOGRAM (TEE) (N/A) Cont tube feeds when tube opened/ replaced PT, trach care  LOS: 26 days    [name] 08/05/2017

## 2017-08-05 NOTE — Progress Notes (Signed)
[facility] Tube Team Note:  [facility] RD paged due to occluded tube.   This is the third instance of the this patient tube being clogged. Tube had to be replaced for one of them. Would appreciate pharmacy looking at medication list to see if any could be changed to liquids.   Reinserted stylet. Occlusion felt to be at proximal end, approximately near nasal bridle. Unclipped nasal bridle. Was able to physically palpate and break apart clog with fingers. Reinserted stylet to further dislodge occlusion. Powerflushed w/ normal saline flush x2.   Reclipped nasal bridle at approximately 92 cm mark.   RN was successfully able to administer meds and restart TF. No tube replacement warranted at this time.   If the tube becomes dislodged please keep the tube and contact the [facility] team at [email or web address] (password [email or web address]) for replacement.  If after hours and replacement cannot be delayed, place a NG tube and confirm placement with an abdominal x-ray.    [name] RD, LDN, CNSC Clinical Nutrition Pager: [phone] 08/05/2017 12:23 PM

## 2017-08-05 NOTE — Progress Notes (Signed)
Patient ID: ABBAGAYLE Harmon, female   DOB: [birth year], 77 y.o.   MRN: [id number] [name]                 19 Days Post-Op Procedure(s) (LRB): REMOVAL OF RVAD WITH PUMP STANDBY (N/A) TRANSESOPHAGEAL ECHOCARDIOGRAM (TEE) (N/A)  Total Length of Stay:  LOS: 26 days  BP 120/89   Pulse 96   Temp 98.2 F (36.8 C) (Oral)   Resp (!) 26   Ht 5\' 5"  (1.651 m)   Wt 166 lb 0.1 oz (75.3 kg)   SpO2 98%   BMI 27.62 kg/m   .Intake/Output      02/09 0701 - 02/10 0700   NG/GT 384.7   IV Piggyback 200   Total Intake(mL/kg) 584.7 (7.8)   Urine (mL/kg/hr) 1075 (1.2)   Stool 0   Total Output 1075   Net -490.3       Urine Occurrence 3 x   Stool Occurrence 2 x     . sodium chloride 250 mL (07/22/17 1820)  . feeding supplement (VITAL 1.5 CAL) 1,000 mL (08/05/17 1800)  . lactated ringers Stopped (07/17/17 1200)     Lab Results  Component Value Date   WBC 11.0 (H) 08/05/2017   HGB 12.0 08/05/2017   HCT 40.3 08/05/2017   PLT 324 08/05/2017   GLUCOSE 116 (H) 08/05/2017   CHOL 165 03/15/2017   TRIG 530 (H) 07/23/2017   HDL 57 03/15/2017   LDLCALC 89 03/15/2017   ALT 61 (H) 07/29/2017   AST 104 (H) 07/29/2017   NA 144 08/05/2017   K 3.2 (L) 08/05/2017   CL 105 08/05/2017   CREATININE 0.65 08/05/2017   BUN 38 (H) 08/05/2017   CO2 27 08/05/2017   TSH 0.990 03/15/2017   INR 1.24 07/25/2017   HGBA1C 5.2 07/06/2017   Trying to pull out things today Could not stand today    Grace Isaac MD  Beeper [phone] Office [phone] 08/05/2017 7:24 PM

## 2017-08-06 LAB — BASIC METABOLIC PANEL
Anion gap: 13 (ref 5–15)
BUN: 41 mg/dL — ABNORMAL HIGH (ref 6–20)
CO2: 26 mmol/L (ref 22–32)
Calcium: 7.5 mg/dL — ABNORMAL LOW (ref 8.9–10.3)
Chloride: 104 mmol/L (ref 101–111)
Creatinine, Ser: 0.66 mg/dL (ref 0.44–1.00)
GFR calc Af Amer: >60 mL/min (ref >60)
GFR calc non Af Amer: >60 mL/min (ref >60)
Glucose, Bld: 120 mg/dL — ABNORMAL HIGH (ref 65–99)
Potassium: 3.2 mmol/L — ABNORMAL LOW (ref 3.5–5.1)
Sodium: 143 mmol/L (ref 135–145)

## 2017-08-06 LAB — GLUCOSE, CAPILLARY
Glucose-Capillary: 97 mg/dL (ref 65–99)
Glucose-Capillary: 126 mg/dL — ABNORMAL HIGH (ref 65–99)
Glucose-Capillary: 122 mg/dL — ABNORMAL HIGH (ref 65–99)
Glucose-Capillary: 98 mg/dL (ref 65–99)
Glucose-Capillary: 128 mg/dL — ABNORMAL HIGH (ref 65–99)

## 2017-08-06 MED ORDER — POTASSIUM CHLORIDE 10 MEQ/100ML IV SOLN
10.0000 meq | INTRAVENOUS | Status: AC
  Administered 2017-08-06 (×4): 10 meq via INTRAVENOUS
  Filled 2017-08-06 (×4): qty 100

## 2017-08-06 NOTE — Progress Notes (Signed)
Patient ID: [name], female   DOB: 09-10-1940, 77 y.o.   MRN: [id number] TCTS DAILY ICU PROGRESS NOTE                   [address]            [place] [address]          [phone]   20 Days Post-Op Procedure(s) (LRB): REMOVAL OF RVAD WITH PUMP STANDBY (N/A) TRANSESOPHAGEAL ECHOCARDIOGRAM (TEE) (N/A)   27 days postop AORTIC VALVE REPLACEMENT (AVR) (N/A) REPLACEMENT ASCENDING AORTA (N/A) TRANSESOPHAGEAL ECHOCARDIOGRAM    Total Length of Stay:  LOS: 27 days   Subjective: Patient remains unchanged alert but agitated at times.  Objective: Vital signs in last 24 hours: Temp:  [98.2 F (36.8 C)-99.3 F (37.4 C)] 98.5 F (36.9 C) (02/10 0750) Pulse Rate:  [83-102] 88 (02/10 0747) Cardiac Rhythm: Normal sinus rhythm (02/10 0410) Resp:  [14-30] 18 (02/10 0747) BP: (83-161)/(50-90) 113/67 (02/10 0600) SpO2:  [94 %-100 %] 100 % (02/10 0749) FiO2 (%):  [28 %] 28 % (02/10 0746) Weight:  [164 lb 10.9 oz (74.7 kg)-166 lb 0.1 oz (75.3 kg)] 164 lb 10.9 oz (74.7 kg) (02/10 0500)  Filed Weights   08/04/17 0344 08/05/17 1600 08/06/17 0500  Weight: 176 lb 5.9 oz (80 kg) 166 lb 0.1 oz (75.3 kg) 164 lb 10.9 oz (74.7 kg)    Weight change:    Hemodynamic parameters for last 24 hours:    Intake/Output from previous day: 02/09 0701 - 02/10 0700 In: 1484.7 [P.O.:120; NG/GT:864.7; IV Piggyback:500] Out: 4917 [Urine:1470]  Intake/Output this shift: No intake/output data recorded.  Current Meds: Scheduled Meds: . amiodarone  200 mg Oral BID  . arformoterol  15 mcg Nebulization BID  . aspirin EC  325 mg Oral Daily   Or  . aspirin  324 mg Per Tube Daily  . budesonide (PULMICORT) nebulizer solution  0.5 mg Nebulization BID  . chlorhexidine gluconate (MEDLINE KIT)  15 mL Mouth Rinse BID  . Chlorhexidine Gluconate Cloth  6 each Topical Daily  . clonazepam  1 mg Oral QHS  . enoxaparin (LOVENOX) injection  40 mg Subcutaneous Q24H  . feeding supplement (PRO-STAT  SUGAR FREE 64)  30 mL Per Tube TID  . furosemide  60 mg Oral Daily  . Osher Oettinger's butt cream   Topical BID  . insulin aspart  0-24 Units Subcutaneous Q4H  . insulin detemir  10 Units Subcutaneous BID  . latanoprost  1 drop Both Eyes QHS  . levalbuterol  0.63 mg Nebulization BID  . levothyroxine  100 mcg Oral QAC breakfast  . mouth rinse  15 mL Mouth Rinse QID  . naphazoline-glycerin  1-2 drop Both Eyes BID  . pantoprazole (PROTONIX) IV  40 mg Intravenous Q12H  . QUEtiapine  75 mg Oral QHS  . sennosides  5 mL Oral BID  . sodium chloride flush  10-40 mL Intracatheter Q12H  . spironolactone  25 mg Oral Daily  . venlafaxine  37.5 mg Oral BID   Continuous Infusions: . sodium chloride 250 mL (07/22/17 1820)  . feeding supplement (VITAL 1.5 CAL) 1,000 mL (08/06/17 0600)  . lactated ringers Stopped (07/17/17 1200)  . potassium chloride 10 mEq (08/06/17 0727)   PRN Meds:.acetaminophen (TYLENOL) oral liquid 160 mg/5 mL, levalbuterol, metoprolol tartrate, ondansetron (ZOFRAN) IV  General appearance: alert and no distress Neurologic: intact Heart: regular rate and rhythm, S1, S2 normal, no murmur, click, rub or gallop Lungs:  diminished breath sounds bibasilar  Lab Results: CBC: Recent Labs    08/05/17 0405  WBC 11.0*  HGB 12.0  HCT 40.3  PLT 324   BMET:  Recent Labs    08/05/17 0405 08/06/17 0223  NA 144 143  K 3.2* 3.2*  CL 105 104  CO2 27 26  GLUCOSE 116* 120*  BUN 38* 41*  CREATININE 0.65 0.66  CALCIUM 7.6* 7.5*    CMET: Lab Results  Component Value Date   WBC 11.0 (H) 08/05/2017   HGB 12.0 08/05/2017   HCT 40.3 08/05/2017   PLT 324 08/05/2017   GLUCOSE 120 (H) 08/06/2017   CHOL 165 03/15/2017   TRIG 530 (H) 07/23/2017   HDL 57 03/15/2017   LDLCALC 89 03/15/2017   ALT 61 (H) 07/29/2017   AST 104 (H) 07/29/2017   NA 143 08/06/2017   K 3.2 (L) 08/06/2017   CL 104 08/06/2017   CREATININE 0.66 08/06/2017   BUN 41 (H) 08/06/2017   CO2 26 08/06/2017   TSH  0.990 03/15/2017   INR 1.24 07/25/2017   HGBA1C 5.2 07/06/2017      PT/INR: No results for input(s): LABPROT, INR in the last 72 hours. Radiology: Dg Abd Portable 1v  Result Date: 08/05/2017 CLINICAL DATA:  Feeding tube replacement. EXAM: PORTABLE ABDOMEN - 1 VIEW COMPARISON:  07/29/2017 FINDINGS: A small bore feeding tube is identified with tip overlying the proximal jejunum. The bowel gas pattern is unremarkable. No other significant abnormalities noted. IMPRESSION: Small bore feeding tube tip overlying the proximal small bowel. Electronically Signed   By: Margarette Canada M.D.   On: 08/05/2017 16:27     Assessment/Plan: Cont tube feeds when tube opened/ replaced PT, trach care Hypokalemia, potassium being replaced Renal function stable  Grace Isaac 08/06/2017 8:07 AM

## 2017-08-06 NOTE — Progress Notes (Signed)
Patient ID: [name], female   DOB: [birth year], 77 y.o.   MRN: [id number] EVENING ROUNDS NOTE :     [address]       [place] [address]             [phone]                 20 Days Post-Op Procedure(s) (LRB): REMOVAL OF RVAD WITH PUMP STANDBY (N/A) TRANSESOPHAGEAL ECHOCARDIOGRAM (TEE) (N/A)  Total Length of Stay:  LOS: 27 days  BP 122/73   Pulse 80   Temp 98.5 F (36.9 C) (Oral)   Resp 20   Ht 5\' 5"  (1.651 m)   Wt 164 lb 10.9 oz (74.7 kg)   SpO2 99%   BMI 27.40 kg/m   .Intake/Output      02/10 0701 - 02/11 0700   P.O.    NG/GT 540   IV Piggyback 100   Total Intake(mL/kg) 640 (8.6)   Urine (mL/kg/hr) 1150 (1.2)   Stool 0   Total Output 1150   Net -510       Stool Occurrence 1 x     . sodium chloride 250 mL (07/22/17 1820)  . feeding supplement (VITAL 1.5 CAL) 1,000 mL (08/06/17 1800)  . lactated ringers Stopped (07/17/17 1200)     Lab Results  Component Value Date   WBC 11.0 (H) 08/05/2017   HGB 12.0 08/05/2017   HCT 40.3 08/05/2017   PLT 324 08/05/2017   GLUCOSE 120 (H) 08/06/2017   CHOL 165 03/15/2017   TRIG 530 (H) 07/23/2017   HDL 57 03/15/2017   LDLCALC 89 03/15/2017   ALT 61 (H) 07/29/2017   AST 104 (H) 07/29/2017   NA 143 08/06/2017   K 3.2 (L) 08/06/2017   CL 104 08/06/2017   CREATININE 0.66 08/06/2017   BUN 41 (H) 08/06/2017   CO2 26 08/06/2017   TSH 0.990 03/15/2017   INR 1.24 07/25/2017   HGBA1C 5.2 07/06/2017   Patient in chair this evening, somewhat increased secretions   Grace Isaac MD  Beeper [phone] Office [phone] 08/06/2017 7:34 PM

## 2017-08-07 ENCOUNTER — Inpatient Hospital Stay (HOSPITAL_COMMUNITY): Payer: Medicare Other

## 2017-08-07 DIAGNOSIS — I480 Paroxysmal atrial fibrillation: Secondary | ICD-10-CM

## 2017-08-07 LAB — BASIC METABOLIC PANEL WITH GFR
Anion gap: 13 (ref 5–15)
BUN: 41 mg/dL — ABNORMAL HIGH (ref 6–20)
CO2: 27 mmol/L (ref 22–32)
Calcium: 7.5 mg/dL — ABNORMAL LOW (ref 8.9–10.3)
Chloride: 105 mmol/L (ref 101–111)
Creatinine, Ser: 0.66 mg/dL (ref 0.44–1.00)
GFR calc Af Amer: >60 mL/min
GFR calc non Af Amer: >60 mL/min
Glucose, Bld: 114 mg/dL — ABNORMAL HIGH (ref 65–99)
Potassium: 3.4 mmol/L — ABNORMAL LOW (ref 3.5–5.1)
Sodium: 145 mmol/L (ref 135–145)

## 2017-08-07 LAB — GLUCOSE, CAPILLARY
Glucose-Capillary: 104 mg/dL — ABNORMAL HIGH (ref 65–99)
Glucose-Capillary: 115 mg/dL — ABNORMAL HIGH (ref 65–99)
Glucose-Capillary: 117 mg/dL — ABNORMAL HIGH (ref 65–99)
Glucose-Capillary: 122 mg/dL — ABNORMAL HIGH (ref 65–99)
Glucose-Capillary: 126 mg/dL — ABNORMAL HIGH (ref 65–99)
Glucose-Capillary: 78 mg/dL (ref 65–99)
Glucose-Capillary: 88 mg/dL (ref 65–99)

## 2017-08-07 LAB — CBC
HCT: 41.2 % (ref 36.0–46.0)
Hemoglobin: 12.5 g/dL (ref 12.0–15.0)
MCH: 30.2 pg (ref 26.0–34.0)
MCHC: 30.3 g/dL (ref 30.0–36.0)
MCV: 99.5 fL (ref 78.0–100.0)
Platelets: 277 10*3/uL (ref 150–400)
RBC: 4.14 MIL/uL (ref 3.87–5.11)
RDW: 16.6 % — ABNORMAL HIGH (ref 11.5–15.5)
WBC: 9.6 10*3/uL (ref 4.0–10.5)

## 2017-08-07 LAB — MRSA PCR SCREENING: MRSA by PCR: NEGATIVE

## 2017-08-07 MED ORDER — POTASSIUM CHLORIDE 10 MEQ/50ML IV SOLN
10.0000 meq | INTRAVENOUS | Status: AC
  Administered 2017-08-07 (×3): 10 meq via INTRAVENOUS
  Filled 2017-08-07 (×3): qty 50

## 2017-08-07 MED ORDER — RESOURCE THICKENUP CLEAR PO POWD
ORAL | Status: DC | PRN
  Filled 2017-08-07 (×2): qty 125

## 2017-08-07 MED ORDER — POTASSIUM CHLORIDE 10 MEQ/50ML IV SOLN
10.0000 meq | INTRAVENOUS | Status: AC
  Administered 2017-08-07 (×2): 10 meq via INTRAVENOUS
  Filled 2017-08-07: qty 50

## 2017-08-07 NOTE — Progress Notes (Signed)
Modified Barium Swallow Progress Note  Patient Details  Name: [name] MRN: [id number] Date of Birth: [birth year]  Today's Date: 08/07/2017  Modified Barium Swallow completed.  Full report located under Chart Review in the Imaging Section.  Brief recommendations include the following:  Clinical Impression  Pt exhibits mild-mod oropharyngeal dsyphagia marked by motor weakness impairments rather than timing and coordination. PMV in place during study. Anterior hyolaryngeal excursion appeared adequate but elevation of larynx decreased leading to reduced epiglottic deflection further impacted by nasal tube. Silent laryngeal penetration occured mostly during the swallow with all consistencies and once after from vallecular space. Verbal cues for hard cough effectively cleared most penetrated barium. Esophageal scanned revealed slower transit (not formally diagnosed with MBS). Recommend Dys 1 only, pudding thick liquids, meds crushed and removal of NGT after evidence of safe and efficient swallow and adequate quantity with recomendations.                                              Swallow Evaluation Recommendations       SLP Diet Recommendations: Dysphagia 1 (Puree) solids;Pudding thick liquid;No liquids       Medication Administration: Crushed with puree   Supervision: Patient able to self feed;Full supervision/cueing for compensatory strategies   Compensations: Minimize environmental distractions;Slow rate;Small sips/bites;Clear throat after each swallow   Postural Changes: Seated upright at 90 degrees;Remain semi-upright after after feeds/meals (Comment)   Oral Care Recommendations: Oral care BID   Other Recommendations: Place PMSV during PO intake    [name] 08/07/2017,11:40 AM   [name] [phone]

## 2017-08-07 NOTE — Progress Notes (Signed)
21 Days Post-Op Procedure(s) (LRB): REMOVAL OF RVAD WITH PUMP STANDBY (N/A) TRANSESOPHAGEAL ECHOCARDIOGRAM (TEE) (N/A) Subjective: Up in chair Good phoation Needs to walk, start PO diet when safe per SLT  Objective: Vital signs in last 24 hours: Temp:  [97.1 F (36.2 C)-98.5 F (36.9 C)] 97.7 F (36.5 C) (02/11 0300) Pulse Rate:  [80-259] 87 (02/11 0600) Cardiac Rhythm: Normal sinus rhythm (02/11 0400) Resp:  [16-25] 25 (02/11 0600) BP: (89-134)/(54-77) 115/60 (02/11 0600) SpO2:  [91 %-100 %] 94 % (02/11 0600) FiO2 (%):  [28 %] 28 % (02/11 0404) Weight:  [166 lb 7.2 oz (75.5 kg)] 166 lb 7.2 oz (75.5 kg) (02/11 0404)  Hemodynamic parameters for last 24 hours:  stable  Intake/Output from previous day: 02/10 0701 - 02/11 0700 In: 1260 [NG/GT:1110; IV Piggyback:150] Out: 9242 [Urine:1475] Intake/Output this shift: No intake/output data recorded.       Exam    General- alert and comfortable    Neck- no JVD, no cervical adenopathy palpable, no carotid bruit   Lungs- clear without rales, wheezes   Cor- regular rate and rhythm, no murmur , gallop   Abdomen- soft, non-tender   Extremities - warm, non-tender, minimal edema   Neuro- oriented, appropriate, no focal weakness   Lab Results: Recent Labs    08/05/17 0405 08/07/17 0325  WBC 11.0* 9.6  HGB 12.0 12.5  HCT 40.3 41.2  PLT 324 277   BMET:  Recent Labs    08/06/17 0223 08/07/17 0325  NA 143 145  K 3.2* 3.4*  CL 104 105  CO2 26 27  GLUCOSE 120* 114*  BUN 41* 41*  CREATININE 0.66 0.66  CALCIUM 7.5* 7.5*    PT/INR: No results for input(s): LABPROT, INR in the last 72 hours. ABG    Component Value Date/Time   PHART 7.502 (H) 08/03/2017 0347   HCO3 27.4 08/03/2017 0347   TCO2 28 08/03/2017 0347   ACIDBASEDEF 1.0 07/18/2017 0404   O2SAT 90.0 08/03/2017 0347   CBG (last 3)  Recent Labs    08/06/17 1947 08/06/17 2323 08/07/17 0319  GLUCAP 98 128* 88    Assessment/Plan: S/P Procedure(s)  (LRB): REMOVAL OF RVAD WITH PUMP STANDBY (N/A) TRANSESOPHAGEAL ECHOCARDIOGRAM (TEE) (N/A) Ambulate start po diet  LOS: 28 days    [name] 08/07/2017

## 2017-08-07 NOTE — Progress Notes (Signed)
  Speech Language Pathology  Patient Details Name: [name] MRN: [id number] DOB: 29-Sep-1940 Today's Date: 08/07/2017 Time:  -      MBS scheduled for today at 10:30.   Orbie Pyo [place] [phone]

## 2017-08-07 NOTE — Progress Notes (Signed)
Patient ID: [name], female   DOB: Sep 15, 1940, 77 y.o.   MRN: [id number]     Advanced Heart Failure Rounding Note  Primary Cardiologist: Dr. Percival Spanish (2014) HF: (New) Dr. Haroldine Laws   Subjective:    Events - 07/10/17 S/p AVR and ascending AA replacement  - 07/10/17 VF Arrest in evening with shock x 1 - 07/11/17 RVAD placement - 07/17/2017 RVAD removed with chest closed -1/23 Extubated. Went in A fib. Amio started.  -1/24 Reintubated.  -1/29 Trach -2/6 Tracheal aspirate - gram + cocci. Started vanc.   Denies SOB or CP. Seated in chair. Swallow study this afternoon. Still with some secretions from trach. Now with Passe-Muir valve.   Echo (07/22/17): EF 65-70%, bioprosthetic aortic valve functioning normally, PASP 33 mmHg, RV looks ok.   Objective:   Weight Range: 166 lb 7.2 oz (75.5 kg) Body mass index is 27.7 kg/m.   Vital Signs:   Temp:  [97.1 F (36.2 C)-98.5 F (36.9 C)] 98 F (36.7 C) (02/11 0820) Pulse Rate:  [80-259] 95 (02/11 0916) Resp:  [16-25] 21 (02/11 0916) BP: (89-136)/(54-86) 136/86 (02/11 0916) SpO2:  [91 %-100 %] 98 % (02/11 0920) FiO2 (%):  [28 %] 28 % (02/11 0920) Weight:  [166 lb 7.2 oz (75.5 kg)] 166 lb 7.2 oz (75.5 kg) (02/11 0404) Last BM Date: 08/06/17  Weight change: Filed Weights   08/05/17 1600 08/06/17 0500 08/07/17 0404  Weight: 166 lb 0.1 oz (75.3 kg) 164 lb 10.9 oz (74.7 kg) 166 lb 7.2 oz (75.5 kg)    Intake/Output:   Intake/Output Summary (Last 24 hours) at 08/07/2017 1032 Last data filed at 08/07/2017 0800 Gross per 24 hour  Intake 1070 ml  Output 1375 ml  Net -305 ml    Physical Exam   General: NAD. Seated in chair.  HEENT: Trach. With passe-muir valve Neck: Supple. JVP 6-7. Carotids 2+ bilat; no bruits. No thyromegaly or nodule noted. Cor: PMI nondisplaced. RRR, No M/G/R noted Lungs: Coarse throughout.  Abdomen: Soft, non-tender, non-distended, no HSM. No bruits or masses. +BS  Extremities: No cyanosis, clubbing, or  rash. R and LLE no edema.  Neuro: Alert & orientedx3, cranial nerves grossly intact. moves all 4 extremities w/o difficulty. Affect pleasant   Telemetry   NSR 80-90s, personally reviewed.   EKG    No new tracings.  Labs    CBC Recent Labs    08/05/17 0405 08/07/17 0325  WBC 11.0* 9.6  HGB 12.0 12.5  HCT 40.3 41.2  MCV 99.8 99.5  PLT 324 374   Basic Metabolic Panel Recent Labs    08/06/17 0223 08/07/17 0325  NA 143 145  K 3.2* 3.4*  CL 104 105  CO2 26 27  GLUCOSE 120* 114*  BUN 41* 41*  CREATININE 0.66 0.66  CALCIUM 7.5* 7.5*   Liver Function Tests No results for input(s): AST, ALT, ALKPHOS, BILITOT, PROT, ALBUMIN in the last 72 hours. No results for input(s): LIPASE, AMYLASE in the last 72 hours. Cardiac Enzymes No results for input(s): CKTOTAL, CKMB, CKMBINDEX, TROPONINI in the last 72 hours.  BNP: BNP (last 3 results) No results for input(s): BNP in the last 8760 hours.  ProBNP (last 3 results) No results for input(s): PROBNP in the last 8760 hours.   D-Dimer No results for input(s): DDIMER in the last 72 hours. Hemoglobin A1C No results for input(s): HGBA1C in the last 72 hours. Fasting Lipid Panel No results for input(s): CHOL, HDL, LDLCALC, TRIG, CHOLHDL, LDLDIRECT in the  last 72 hours. Thyroid Function Tests No results for input(s): TSH, T4TOTAL, T3FREE, THYROIDAB in the last 72 hours.  Invalid input(s): FREET3  Other results:   Imaging    No results found.  Medications:    Scheduled Medications: . amiodarone  200 mg Oral BID  . arformoterol  15 mcg Nebulization BID  . aspirin EC  325 mg Oral Daily   Or  . aspirin  324 mg Per Tube Daily  . budesonide (PULMICORT) nebulizer solution  0.5 mg Nebulization BID  . chlorhexidine gluconate (MEDLINE KIT)  15 mL Mouth Rinse BID  . Chlorhexidine Gluconate Cloth  6 each Topical Daily  . clonazepam  1 mg Oral QHS  . enoxaparin (LOVENOX) injection  40 mg Subcutaneous Q24H  . feeding  supplement (PRO-STAT SUGAR FREE 64)  30 mL Per Tube TID  . furosemide  60 mg Oral Daily  . Gerhardt's butt cream   Topical BID  . insulin aspart  0-24 Units Subcutaneous Q4H  . insulin detemir  10 Units Subcutaneous BID  . latanoprost  1 drop Both Eyes QHS  . levalbuterol  0.63 mg Nebulization BID  . levothyroxine  100 mcg Oral QAC breakfast  . mouth rinse  15 mL Mouth Rinse QID  . naphazoline-glycerin  1-2 drop Both Eyes BID  . pantoprazole (PROTONIX) IV  40 mg Intravenous Q12H  . QUEtiapine  75 mg Oral QHS  . sennosides  5 mL Oral BID  . sodium chloride flush  10-40 mL Intracatheter Q12H  . spironolactone  25 mg Oral Daily  . venlafaxine  37.5 mg Oral BID    Infusions: . sodium chloride 250 mL (07/22/17 1820)  . feeding supplement (VITAL 1.5 CAL) 1,000 mL (08/07/17 0600)  . lactated ringers Stopped (07/17/17 1200)  . potassium chloride 10 mEq (08/07/17 0944)    PRN Medications: acetaminophen (TYLENOL) oral liquid 160 mg/5 mL, levalbuterol, metoprolol tartrate, ondansetron (ZOFRAN) IV  Patient Profile   [name] is a 77 y.o. female with history of fusiform ascending aortic aneurysm, HTN, and HLD.   Admitted 07/10/17 for planned AVR and replacement of ascending aortic aneurysm. Developed acute cardiogenic shock + VF arrest. Taken back to OR am of 07/11/17 for evacuation of hematoma and placement of RVAD.   Assessment/Plan   1. Cardiogenic shock s/p cardiac surgery with R>L CHF:  Pre-op Echo 06/02/2017 LVEF 55-60%, Grade 1 DD, Mild/Mod AI with LVOT, Mid ascending aortic diameter 48.88 mm, Mild LAE, RV normal.  Post-op shock, RVAD placed now s/p RVAD removal 07/17/2017.  Echo 1/26 with EF 65-70%, bioprosthetic aortic valve, improved RV function.  - Volume status stable on exam.  - Continue lasix 60 mg daily. May need to re-evaluate once taking po.  - Continue 25 mg spironolactone daily.  -  RV function looked better on echo 07/22/16.  2. VF arrest:  - Rhythm currently stable  on po amio  - Goal K > 4.0 and Mg > 2.0 - IV K supp ordered.  3. Post Op Atrial fibrillation:  -- Only short run post op. So no AC for now. - Remains in NSR.  - Currently only getting lovenox --->DVT prophylaxis. 4. Acute respiratory failure: Reintubated 1/24.   - S/P Trach 1/29.  - Tolerating O2 supp, no longer on trach collar.  - Has completed ceftazidime per CCM.  - tracheual aspirate--> gram + cocci. Started on vancomycin.  5. S/p AVR and Ascending aortic aneurysm replacement. - Post-op echo stable.  6. AKI:  -   Resolved.  7. Anemia:  - Stable 12.5    - s/p 1uPRBCs 07/23/17. 8. Hypokalemia - K 3.4 this am. IV supp ordered.   Length of Stay: [place], [place]  10:32 AM Advanced Heart Failure Team Pager [phone] (M-F; 7a - 4p)  Please contact [facility] for night-coverage after hours (4p -7a ) and weekends on [email or web address]  Patient seen and examined with the above-signed Advanced Practice Provider and/or Housestaff. I personally reviewed laboratory data, imaging studies and relevant notes. I independently examined the patient and formulated the important aspects of the plan. I have edited the note to reflect any of my changes or salient points. I have personally discussed the plan with the patient and/or family.  Stable from cardiac standpoint. Volume status looks good. Maintaining NSR on po amio. Will decreae to 200 daily. Repeat swallow study today.  Glori Bickers, MD  11:10 AM

## 2017-08-07 NOTE — Progress Notes (Signed)
..  Name: [name] MRN: [id number] DOB: 02-24-1941    ADMISSION DATE:  07/10/2017  CONSULTATION DATE:  07/12/17  REFERRING MD :  Nils Pyle MD   BRIEF:   77 year old female with past medical history significant for fusiform ascending aortic aneurysm, hypertension, hyperlipidemia, status post AVR and replacement of ascending aortic aneurysm.  On 07/10/2017 postprocedure patient developed acute cardiogenic shock had an episode of V. fib arrest was taken back to the OR on 07/11/2017 for evacuation of hematoma and placement of RVAD. She had prolonged mechanical ventilation requiring tracheostomy.   SUBJECTIVE:   No events overnight, no new complaints  VITAL SIGNS: Temp:  [97.1 F (36.2 C)-98.5 F (36.9 C)] 98 F (36.7 C) (02/11 0820) Pulse Rate:  [80-259] 95 (02/11 0916) Resp:  [16-25] 21 (02/11 0916) BP: (89-136)/(54-86) 136/86 (02/11 0916) SpO2:  [91 %-100 %] 98 % (02/11 0920) FiO2 (%):  [28 %] 28 % (02/11 0920) Weight:  [75.5 kg (166 lb 7.2 oz)] 75.5 kg (166 lb 7.2 oz) (02/11 0404)   Filed Weights   08/05/17 1600 08/06/17 0500 08/07/17 0404  Weight: 75.3 kg (166 lb 0.1 oz) 74.7 kg (164 lb 10.9 oz) 75.5 kg (166 lb 7.2 oz)    Intake/Output Summary (Last 24 hours) at 08/07/2017 1003 Last data filed at 08/07/2017 0800 Gross per 24 hour  Intake 1070 ml  Output 1375 ml  Net -305 ml   PHYSICAL EXAMINATION: General: Chronically ill appearing female, NAD HEENT: Trach in place, [place]/AT, PERRL, EOM-I and MMM Neuro: Alert and interactive, moving all ext to command CV: RRR, Nl S1/S2 and -M/R/G PULM: CTA bilaterally GI: Soft, NT, ND and +BS Extremities: Bilateral edema improving Skin: No rash  PULMONARY  CBC Recent Labs  Lab 08/01/17 0339 08/01/17 1620 08/05/17 0405 08/07/17 0325  HGB 12.2 13.6 12.0 12.5  HCT 41.9 40.0 40.3 41.2  WBC 10.0  --  11.0* 9.6  PLT 453*  --  324 277   COAGULATION No results for input(s): INR in the last 168 hours.  CARDIAC  No results  for input(s): TROPONINI in the last 168 hours. No results for input(s): PROBNP in the last 168 hours.  CHEMISTRY Recent Labs  Lab 08/03/17 0548 08/04/17 0139 08/05/17 0405 08/06/17 0223 08/07/17 0325  NA 145 144 144 143 145  K 4.0 4.3 3.2* 3.2* 3.4*  CL 105 107 105 104 105  CO2 26 26 27 26 27   GLUCOSE 160* 136* 116* 120* 114*  BUN 33* 32* 38* 41* 41*  CREATININE 0.67 0.59 0.65 0.66 0.66  CALCIUM 8.0* 8.0* 7.6* 7.5* 7.5*  MG 2.2  --   --   --   --    Estimated Creatinine Clearance: 61.8 mL/min (by C-G formula based on SCr of 0.66 mg/dL).  LIVER No results for input(s): AST, ALT, ALKPHOS, BILITOT, PROT, ALBUMIN, INR in the last 168 hours.  INFECTIOUS No results for input(s): LATICACIDVEN, PROCALCITON in the last 168 hours.  ENDOCRINE CBG (last 3)  Recent Labs    08/06/17 2323 08/07/17 0319 08/07/17 0815  GLUCAP 128* 88 104*   IMAGING  Dg Abd Portable 1v  Result Date: 08/05/2017 CLINICAL DATA:  Feeding tube replacement. EXAM: PORTABLE ABDOMEN - 1 VIEW COMPARISON:  07/29/2017 FINDINGS: A small bore feeding tube is identified with tip overlying the proximal jejunum. The bowel gas pattern is unremarkable. No other significant abnormalities noted. IMPRESSION: Small bore feeding tube tip overlying the proximal small bowel. Electronically Signed   By: Dellis Filbert  Hu M.D.   On: 08/05/2017 16:27   STUDIES: Echo12/12/2016 LVEF 55-60%, Grade 1 DD, Mild/Mod AI with LVOT, Mid ascending aortic diameter 48.88 mm, Mild LAE, RV normal Echo (07/22/17): EF 65-70%, bioprosthetic aortic valve functioning normally, PASP 33 mmHg, RV looks ok.   CULTURES 1/24 sputum >> oral flora  ANTIBIOTICS Ceftaz1/17 >> 1.20 Cefuroxime 1/21 >> 1/23 Ceftaz 1/23 >> 1/31  EVENTS 1/14  Admit 1/14 S/p AVR and ascending AA replacement  1/14 VF Arrest in evening with shock x 1 1/15 to OR for evacuation of hematoma and RVAD placement  1/16 PCCM consult 1/19 Lasix per cardiology,  CVP of 5 1/21 RVAD  removed 1/22 heavily diuresed  1/23 ready for extubation. Atrial fib amio started.  1/24 reintubated 1/25  Afebrile 1/29  trach placed 2/01  Tolerating PMV with SLP   I reviewed CXR myself, trach is in good position  ASSESSMENT / PLAN: 77 year old female with PMHx significant for fusiform ascending aortic aneurysm, hypertension, hyperlipidemia, status post AVR and replacement of ascending aortic aneurysm.  On 07/10/2017 postprocedure patient developed acute cardiogenic shock had an episode of V. fib arrest was taken back to the OR on 07/11/2017 for evacuation of hematoma and placement of RVAD.  RVAD now removed, failed extubation 1/24 but continued to wean well. Underwent tracheostomy 1/29.  Course complicated by agitated delirium > improving slowly.   Discussed with PCCM-NP  ASSESSMENT / PLAN:  Chronic respiratory failure:  - Monitor for airway protection  Dysphagia:  - SLP today  Hypoxemia:  - Titrate O2 for sat of 88-92%  - Goal of 28%  Trach status:  - If passes SLP will consider change to a cuffless 4 on way to decannulation  PCCM will follow for trach management.  Rush Farmer, M.D. [facility]/Critical Care Medicine. Pager: [phone]. After hours pager: [phone].

## 2017-08-07 NOTE — Progress Notes (Signed)
Occupational Therapy Treatment Patient Details Name: ZSOFIA PROUT MRN: [id number] DOB: April 12, 1941 Today's Date: 08/07/2017    History of present illness Pt admitted 07/10/17 for AVR and ascending AA replacement. Post procedure had VF arrest. 07/11/17 back to OR to evacuate hematoma and place RVAD due to fluid overload and R systolic HF. RVAD subsequently removed. Pt intubated from 07/10/17-07/19/17. Re-intubated 1/24-1/29; trach placed 1/29. PMH: PVD, afib, vfib, hypothyroidism, anxiety and depression, HTN, HLD, R TKA.   OT comments  Pt making significant progress toward goals. VSS throughout on 6L 28% FiO2 with exception of drop in BP with standing as noted below, however, pt tolerated well. Pt more alert and less confused this session with good participation. Feel pt is appropriate for CIR if she continues to improve - pt very motivated to get better. Will continue to follow acutely to address established goals.   Follow Up Recommendations  CIR    Equipment Recommendations  3 in 1 bedside commode    Recommendations for Other Services Rehab consult    Precautions / Restrictions Precautions Precautions: Fall;Sternal Precaution Comments: reindorced sternal precautions Restrictions Weight Bearing Restrictions: No Other Position/Activity Restrictions: sternal precautions       Mobility Bed Mobility Overal bed mobility: Needs Assistance         Sit to supine: Mod assist;+2 for physical assistance   General bed mobility comments: Pt initiating lying down. A to lift legs onto bed and maintain sternal precautions  Transfers Overall transfer level: Needs assistance Equipment used: 4-wheeled walker(Eva Walker) Transfers: Sit to/from [employer] to Stand: Max assist;+2 physical assistance Stand pivot transfers: +2 physical assistance;Max assist            Balance Overall balance assessment: Needs assistance   Sitting balance-Leahy Scale: Fair Sitting  balance - Comments: Posterior bias, but able to achieve upright posture with vc when attention focused to task; when distracted,, pt able to achieve midline posure with tactiel cues     Standing balance-Leahy Scale: Poor Standing balance comment: posteiror bias; able to shift weight anteriorly - hips positioned posteriorly and head forward. REsponds to tactile cues and able to shift weight anteriorly during static task but unable o maintain upright postrue in dynamci tasks and begins to lean significantly posteriorly                           ADL either performed or assessed with clinical judgement   ADL Overall ADL's : Needs assistance/impaired Eating/Feeding: NPO   Grooming: Wash/dry hands;Wash/dry face;Minimal assistance           Upper Body Dressing : Moderate assistance Upper Body Dressing Details (indicate cue type and reason): Pt assisted by lifting BUE to put through [employer]- Clothing Manipulation and Hygiene: Total assistance Toileting - Clothing Manipulation Details (indicate cue type and reason): Pt able to stand with +2 A while pericare was completed     Functional mobility during ADLs: Maximal assistance;+2 for physical assistance;Cueing for sequencing;Cueing for safety(Eva walker) General ADL Comments: PMV used throughout session; Pt with increased initiation of completing her own simple ADL tasks     Vision       Perception     Praxis      Cognition Arousal/Alertness: Awake/alert Behavior During Therapy: WFL for tasks assessed/performed Overall Cognitive Status: Impaired/Different from baseline Area of Impairment: Attention;Safety/judgement;Following commands;Awareness;Problem solving  Current Attention Level: Sustained Memory: Decreased recall of precautions;Decreased short-term memory Following Commands: Follows one step commands with increased time Safety/Judgement: Decreased awareness of  safety;Decreased awareness of deficits Awareness: Emergent Problem Solving: Slow processing;Difficulty sequencing;Requires verbal cues;Requires tactile cues General Comments: Increased difficulty with following multi step commands; poor awareness of body in space duirng mobility however, ableto follow cues to begin to correct posture        Exercises Other Exercises Other Exercises: B UE general strengthening as tolerated   Shoulder Instructions       General Comments HR 95; BP rest 131/102; standing 149/85 (min complaints fof dizzziness; 136/86 supine after activity    Pertinent Vitals/ Pain       Pain Assessment: No/denies pain  Home Living                                          Prior Functioning/Environment              Frequency  Min 2X/week        Progress Toward Goals  OT Goals(current goals can now be found in the care plan section)  Progress towards OT goals: Progressing toward goals  Acute Rehab OT Goals Patient Stated Goal: to walk OT Goal Formulation: With patient Time For Goal Achievement: 08/17/17 Potential to Achieve Goals: Good ADL Goals Pt Will Perform Eating: Independently;sitting Pt Will Perform Grooming: with min assist;sitting Pt Will Perform Upper Body Dressing: with min assist;sitting Pt Will Transfer to Toilet: with mod assist;stand pivot transfer;bedside commode Pt Will Perform Toileting - Clothing Manipulation and hygiene: with min assist;sit to/from stand Additional ADL Goal #1: Pt will state sternal precautions. Additional ADL Goal #2: Pt will sit EOB x 10 minutes with supervision in preparation for ADL.  Plan Discharge plan remains appropriate;Frequency remains appropriate    Co-evaluation    PT/OT/SLP Co-Evaluation/Treatment: Yes Reason for Co-Treatment: Complexity of the patient's impairments (multi-system involvement);To address functional/ADL transfers;For patient/therapist safety   OT goals addressed  during session: ADL's and self-care      AM-PAC PT "6 Clicks" Daily Activity     Outcome Measure   Help from another person eating meals?: Total Help from another person taking care of personal grooming?: A Lot Help from another person toileting, which includes using toliet, bedpan, or urinal?: Total Help from another person bathing (including washing, rinsing, drying)?: A Lot Help from another person to put on and taking off regular upper body clothing?: A Lot Help from another person to put on and taking off regular lower body clothing?: Total 6 Click Score: 9    End of Session Equipment Utilized During Treatment: Oxygen(6L 28% FiO2` during mobility)  OT Visit Diagnosis: Unsteadiness on feet (R26.81);Muscle weakness (generalized) (M62.81);Other symptoms and signs involving cognitive function   Activity Tolerance Patient tolerated treatment well   Patient Left in bed;with call bell/phone within reach;with bed alarm set;with SCD's reapplied   Nurse Communication Mobility status        Time: 8315-1761 OT Time Calculation (min): 34 min  Charges: OT General Charges $OT Visit: 1 Visit OT Treatments $Self Care/Home Management : 8-22 mins  [facility], OT/L  [phone] 08/07/2017   Carlton Sweaney,HILLARY 08/07/2017, 9:31 AM

## 2017-08-07 NOTE — Progress Notes (Signed)
[facility] BRIEF SICU PROGRESS NOTE  21 Days Post-Op  S/P Procedure(s) (LRB): REMOVAL OF RVAD WITH PUMP STANDBY (N/A) TRANSESOPHAGEAL ECHOCARDIOGRAM (TEE) (N/A)   Stable day  Plan: Continue current plan  Rexene Alberts, MD 08/07/2017 6:02 PM

## 2017-08-07 NOTE — Progress Notes (Signed)
Physical Therapy Treatment Patient Details Name: Terri Terri MRN: [id number] DOB: Nov 16, 1940 Today's Date: 08/07/2017    History of Present Illness Pt admitted 07/10/17 for AVR and ascending AA replacement. Post procedure had VF arrest. 07/11/17 back to OR to evacuate hematoma and place RVAD due to fluid overload and R systolic HF. RVAD subsequently removed. Pt intubated from 07/10/17-07/19/17. Re-intubated 1/24-1/29; trach placed 1/29. PMH: PVD, afib, vfib, hypothyroidism, anxiety and depression, HTN, HLD, R TKA.   PT Comments    Pt progressing well with mobility; remains motivated to participate with therapies and walk. Increased awareness and participation throughout session. Able to amb short distance with Terri Terri walker and maxA+2. VSS throughout on 6L 28% FiO2. Continue to recommend intensive CIR-level therapies to maximize functional mobility and independence prior to return home. Will follow acutely.    Follow Up Recommendations  CIR     Equipment Recommendations  (TBD next venue)    Recommendations for Other Services Rehab consult     Precautions / Restrictions Precautions Precautions: Fall;Sternal Precaution Comments: Reinforced sternal precautions Restrictions Weight Bearing Restrictions: No Other Position/Activity Restrictions: sternal precautions    Mobility  Bed Mobility Overal bed mobility: Needs Assistance         Sit to supine: Mod assist;+2 for physical assistance   General bed mobility comments: Pt initiating lying down. ModA to lift legs onto bed and maintain sternal precautions  Transfers Overall transfer level: Needs assistance Equipment used: 4-wheeled walker(Eva walker) Transfers: Sit to/from Stand Sit to Stand: Max assist;+2 physical assistance Stand pivot transfers: +2 physical assistance;Max assist       General transfer comment: Pt stood 3x total with maxA+2 and Terri Terri walker. Repeated multimodal cues for forward translation of body weight as pt  with signficant posterior lean with BLEs hanging on chair.   Ambulation/Gait Ambulation/Gait assistance: Max assist;+2 physical assistance Ambulation Distance (Feet): 5 Feet Assistive device: 4-wheeled walker(Eva walker) Gait Pattern/deviations: Shuffle;Trunk flexed;Decreased stride length;Leaning posteriorly Gait velocity: Decreased Gait velocity interpretation: <1.8 ft/sec, indicative of risk for recurrent falls General Gait Details: Unsteady ambulation using Eva walker and maxA+2 to maintain balance and trunk elevation, and control walker. Pt with significant posterior lean, with feet too far forward and increased difficulty translating her trunk forward despite multimodal cues. Attempting to sit prematurely, requiring maxA to correct   Stairs            Wheelchair Mobility    Modified Rankin (Stroke Patients Only)       Balance Overall balance assessment: Needs assistance Sitting-balance support: Feet supported;Bilateral upper extremity supported Sitting balance-Leahy Scale: Fair Sitting balance - Comments: Posterior bias, but able to achieve upright posture with vc when attention focused to task; when distracted, pt able to achieve midline posure with tactile cues Postural control: Posterior lean   Standing balance-Leahy Scale: Poor Standing balance comment: Significant posterior bias. Able to shift weight anteriorly with hips positioned posteriorly and head forward. Responds to tactile cues and able to shift weight anteriorly during static task but unable o maintain upright postrue in dynamci tasks and begins to lean significantly posteriorly                            Cognition Arousal/Alertness: Awake/alert Behavior During Therapy: WFL for tasks assessed/performed Overall Cognitive Status: Impaired/Different from baseline Area of Impairment: Attention;Safety/judgement;Following commands;Awareness;Problem solving                   Current Attention  Level:  Sustained Memory: Decreased recall of precautions;Decreased short-term memory Following Commands: Follows one step commands with increased time Safety/Judgement: Decreased awareness of safety;Decreased awareness of deficits Awareness: Emergent Problem Solving: Slow processing;Difficulty sequencing;Requires verbal cues;Requires tactile cues General Comments: Increased difficulty with following multi step commands; poor awareness of body in space duirng mobility however, able to follow cues to begin to correct posture      Exercises Other Exercises Other Exercises: B UE general strengthening as tolerated    General Comments General comments (skin integrity, edema, etc.): HR 95; resting BP 131/102, standing BP 149/85 (c/o dizziness upon standing), post-amb BP 136/86      Pertinent Vitals/Pain Pain Assessment: No/denies pain    Home Living                      Prior Function            PT Goals (current goals can now be found in the care plan section) Acute Rehab PT Goals Patient Stated Goal: to walk PT Goal Formulation: With patient Time For Goal Achievement: 08/21/17 Potential to Achieve Goals: Good Progress towards PT goals: Progressing toward goals    Frequency    Min 3X/week      PT Plan Current plan remains appropriate    Co-evaluation PT/OT/SLP Co-Evaluation/Treatment: Yes Reason for Co-Treatment: Necessary to address cognition/behavior during functional activity;For patient/therapist safety;To address functional/ADL transfers PT goals addressed during session: Mobility/safety with mobility;Balance;Proper use of DME OT goals addressed during session: ADL's and self-care      AM-PAC PT "6 Clicks" Daily Activity  Outcome Measure  Difficulty turning over in bed (including adjusting bedclothes, sheets and blankets)?: Unable Difficulty moving from lying on back to sitting on the side of the bed? : Unable Difficulty sitting down on and standing up  from a chair with arms (e.g., wheelchair, bedside commode, etc,.)?: Unable Help needed moving to and from a bed to chair (including a wheelchair)?: A Lot Help needed walking in hospital room?: A Lot Help needed climbing 3-5 steps with a railing? : Total 6 Click Score: 8    End of Session Equipment Utilized During Treatment: Gait belt;Oxygen Activity Tolerance: Patient tolerated treatment well;Patient limited by fatigue Patient left: in bed;with call bell/phone within reach;with bed alarm set Nurse Communication: Mobility status PT Visit Diagnosis: Other abnormalities of gait and mobility (R26.89);Muscle weakness (generalized) (M62.81)     Time: 2774-1287 PT Time Calculation (min) (ACUTE ONLY): 34 min  Charges:  $Therapeutic Activity: 8-22 mins                    G Codes:      Mabeline Caras, PT, DPT Acute Rehab Services  Pager: [phone] 08/07/2017, 10:09 AM

## 2017-08-08 ENCOUNTER — Inpatient Hospital Stay (HOSPITAL_COMMUNITY): Payer: Medicare Other

## 2017-08-08 DIAGNOSIS — R1310 Dysphagia, unspecified: Secondary | ICD-10-CM

## 2017-08-08 DIAGNOSIS — E876 Hypokalemia: Secondary | ICD-10-CM

## 2017-08-08 HISTORY — PX: TRACHEOSTOMY: SUR1362

## 2017-08-08 LAB — BASIC METABOLIC PANEL
ANION GAP: 13 (ref 5–15)
BUN: 39 mg/dL — ABNORMAL HIGH (ref 6–20)
CALCIUM: 7.5 mg/dL — AB (ref 8.9–10.3)
CHLORIDE: 105 mmol/L (ref 101–111)
CO2: 27 mmol/L (ref 22–32)
CREATININE: 0.59 mg/dL (ref 0.44–1.00)
GFR calc non Af Amer: >60 mL/min (ref >60)
Glucose, Bld: 107 mg/dL — ABNORMAL HIGH (ref 65–99)
Potassium: 3.4 mmol/L — ABNORMAL LOW (ref 3.5–5.1)
SODIUM: 145 mmol/L (ref 135–145)

## 2017-08-08 LAB — GLUCOSE, CAPILLARY
Glucose-Capillary: 89 mg/dL (ref 65–99)
Glucose-Capillary: 100 mg/dL — ABNORMAL HIGH (ref 65–99)
Glucose-Capillary: 98 mg/dL (ref 65–99)
Glucose-Capillary: 116 mg/dL — ABNORMAL HIGH (ref 65–99)
Glucose-Capillary: 116 mg/dL — ABNORMAL HIGH (ref 65–99)
Glucose-Capillary: 148 mg/dL — ABNORMAL HIGH (ref 65–99)

## 2017-08-08 MED ORDER — POTASSIUM CHLORIDE 10 MEQ/50ML IV SOLN
10.0000 meq | INTRAVENOUS | Status: AC
  Administered 2017-08-08 (×3): 10 meq via INTRAVENOUS
  Filled 2017-08-08: qty 50

## 2017-08-08 NOTE — Progress Notes (Signed)
Am potassium 3.4 paged on call cardiology for replacement dose, waiting for return page.

## 2017-08-08 NOTE — Progress Notes (Addendum)
..  Name: [name] MRN: [id number] DOB: September 02, 1940    ADMISSION DATE:  07/10/2017  CONSULTATION DATE:  07/12/17  REFERRING MD :  Nils Pyle MD   BRIEF:   77 year old female with past medical history significant for fusiform ascending aortic aneurysm, hypertension, hyperlipidemia, status post AVR and replacement of ascending aortic aneurysm.  On 07/10/2017 postprocedure patient developed acute cardiogenic shock had an episode of V. fib arrest was taken back to the OR on 07/11/2017 for evacuation of hematoma and placement of RVAD. She had prolonged mechanical ventilation requiring tracheostomy.   SUBJECTIVE:   No events overnight, partially passed FEES but SLP feels that downsizing the trach would help  VITAL SIGNS: Temp:  [97.6 F (36.4 C)-98.3 F (36.8 C)] 97.8 F (36.6 C) (02/12 0834) Pulse Rate:  [87-97] 91 (02/12 0912) Resp:  [13-25] 24 (02/12 0912) BP: (86-160)/(51-87) 160/63 (02/12 0900) SpO2:  [92 %-100 %] 100 % (02/12 0912) FiO2 (%):  [28 %] 28 % (02/12 0912) Weight:  [75.6 kg (166 lb 10.7 oz)] 75.6 kg (166 lb 10.7 oz) (02/12 0500)   Filed Weights   08/06/17 0500 08/07/17 0404 08/08/17 0500  Weight: 74.7 kg (164 lb 10.9 oz) 75.5 kg (166 lb 7.2 oz) 75.6 kg (166 lb 10.7 oz)    Intake/Output Summary (Last 24 hours) at 08/08/2017 1002 Last data filed at 08/08/2017 0900 Gross per 24 hour  Intake 1110 ml  Output 1000 ml  Net 110 ml   PHYSICAL EXAMINATION: General: Chronically ill appearing female, NAD HEENT: Trach in place with PMV, [place]/AT, PERRL, EOM-I and MMM Neuro: Alert and interactive, moving all ext to command CV: RRR, Nl S1/S2 and -M/R/G PULM: CTA bilaterally GI: Soft, NT, ND and +BS Extremities: Bilateral edema improving Skin: No rash  PULMONARY  CBC Recent Labs  Lab 08/01/17 1620 08/05/17 0405 08/07/17 0325  HGB 13.6 12.0 12.5  HCT 40.0 40.3 41.2  WBC  --  11.0* 9.6  PLT  --  324 277   COAGULATION No results for input(s): INR in the last 168  hours.  CARDIAC  No results for input(s): TROPONINI in the last 168 hours. No results for input(s): PROBNP in the last 168 hours.  CHEMISTRY Recent Labs  Lab 08/03/17 0548 08/04/17 0139 08/05/17 0405 08/06/17 0223 08/07/17 0325 08/08/17 0334  NA 145 144 144 143 145 145  K 4.0 4.3 3.2* 3.2* 3.4* 3.4*  CL 105 107 105 104 105 105  CO2 26 26 27 26 27 27   GLUCOSE 160* 136* 116* 120* 114* 107*  BUN 33* 32* 38* 41* 41* 39*  CREATININE 0.67 0.59 0.65 0.66 0.66 0.59  CALCIUM 8.0* 8.0* 7.6* 7.5* 7.5* 7.5*  MG 2.2  --   --   --   --   --    Estimated Creatinine Clearance: 61.8 mL/min (by C-G formula based on SCr of 0.59 mg/dL).  LIVER No results for input(s): AST, ALT, ALKPHOS, BILITOT, PROT, ALBUMIN, INR in the last 168 hours.  INFECTIOUS No results for input(s): LATICACIDVEN, PROCALCITON in the last 168 hours.  ENDOCRINE CBG (last 3)  Recent Labs    08/07/17 2340 08/08/17 0333 08/08/17 0831  GLUCAP 115* 89 100*   IMAGING  Dg Chest Port 1 View  Result Date: 08/08/2017 CLINICAL DATA:  77 year old female with history of shortness of breath. EXAM: PORTABLE CHEST 1 VIEW COMPARISON:  Chest x-ray 08/05/2017. FINDINGS: There is a right upper extremity PICC with tip terminating in the right atrium. A feeding tube  is seen extending into the abdomen, however, the tip of the feeding tube extends below the lower margin of the image. A tracheostomy tube is in place with tip 4.7 cm above the carina. Lung volumes are low. Some linear bibasilar opacities are most compatible with areas of subsegmental atelectasis. No definite consolidative airspace disease. No pleural effusions. No pneumothorax. No evidence of pulmonary edema. Heart size is mildly enlarged. Upper mediastinal contours are within normal limits allowing for patient rotation. Status post median sternotomy for aortic valve replacement (a stented bioprosthesis is noted). Previously noted skin staples projecting over the upper right  hemithorax have been removed. Multiple surgical clips are again noted in the right axillary region and immediately above the level of thoracic inlet. IMPRESSION: 1. Postoperative changes and support apparatus, as above. 2. Low lung volumes with bibasilar areas of subsegmental atelectasis. Electronically Signed   By: Vinnie Langton M.D.   On: 08/08/2017 08:04   Dg Swallowing Func-speech Pathology  Result Date: 08/07/2017 Objective Swallowing Evaluation: Type of Study: MBS-Modified Barium Swallow Study  Patient Details Name: [name] MRN: [id number] Date of Birth: 11/20/1940 Today's Date: 08/07/2017 Time: SLP Start Time (ACUTE ONLY): 1035 -SLP Stop Time (ACUTE ONLY): 1052 SLP Time Calculation (min) (ACUTE ONLY): 17 min Past Medical History: Past Medical History: Diagnosis Date . ACL tear   right  Dr. Gladstone Pih  . Adenomatous polyp  . Anxiety  . Aorta aneurysm ([place])  . Arthritis  . Ascending aortic aneurysm ([place])   note per chart per Dr Lucianne Lei Tright 4.7 cm 04/15/2015  . B12 deficiency  . Cancer ([place])  . Cataracts, both eyes 10/2006 . Chronic bronchitis ([place])  . Constipation  . Depression  . DUB (dysfunctional uterine bleeding) 10/96 . ETD (eustachian tube dysfunction)  . Fall  . Fibromyalgia  . GERD (gastroesophageal reflux disease)  . Glaucoma   ([place]) Dr. Arnoldo Morale  . Glaucoma   bilaterally . Helicobacter pylori (H. pylori)  . Hiatal hernia  . History of bronchitis  . History of frequent urinary tract infections  . History of kidney stones  . History of left shoulder fracture   pt states fell off bed and broke ball in shoulder had rod placed  . Hyperlipidemia  . Hypertension  . Hypothyroidism  . Insomnia  . Leg wound, left   healed  . MVP (mitral valve prolapse)  . Occasional tremors   left arm  . Osteoarthritis  . Osteopenia  . Osteoporosis  . Other and unspecified hyperlipidemia  . Post-menopausal  . Thyroid cancer ([place])  . Thyroid nodule  . Tremor  Past Surgical History: Past Surgical History: Procedure Laterality  Date . AORTIC VALVE REPLACEMENT N/A 07/10/2017  Procedure: AORTIC VALVE REPLACEMENT (AVR);  Surgeon: [name], Collier Salina, MD;  Location: [facility];  Service: Open Heart Surgery;  Laterality: N/A; . APPENDECTOMY   . BUNIONECTOMY  08/1999  right - Dr. Irving Shows  . CHOLECYSTECTOMY  1979 . DILATION AND CURETTAGE OF UTERUS  03/31/95  Dr. Ovid Curd  . EXPLORATION POST OPERATIVE OPEN HEART N/A 07/11/2017  Procedure: EXPLORATION POST OPERATIVE OPEN HEART;  Surgeon: Ivin Poot, MD;  Location: [facility];  Service: Open Heart Surgery;  Laterality: N/A; . EYE SURGERY    also had left cataract removed  . FRACTURE SURGERY   . HEMATOMA EVACUATION N/A 07/11/2017  Procedure: EVACUATION HEMATOMA;  Surgeon: Ivin Poot, MD;  Location: [facility];  Service: Open Heart Surgery;  Laterality: N/A; . HERNIA REPAIR   . NEPHROLITHOTOMY Left 04/07/2017  Procedure: NEPHROLITHOTOMY PERCUTANEOUS WITH SURGEON ACCESS;  Surgeon: Ardis Hughs, MD;  Location[facility];  Service: Urology;  Laterality: Left; . ORIF HUMERUS FRACTURE  05/30/2011  Procedure: OPEN REDUCTION INTERNAL FIXATION (ORIF) PROXIMAL HUMERUS FRACTURE;  Surgeon: Augustin Schooling;  Location: [facility];  Service: Orthopedics;  Laterality: Left;  open reduction internal fixation of proximal humerus fracture . PLACEMENT OF CENTRIMAG VENTRICULAR ASSIST DEVICE Right 07/11/2017  Procedure: PLACEMENT OF CENTRIMAG VENTRICULAR ASSIST DEVICE;  Surgeon: Ivin Poot, MD;  Location: [facility];  Service: Open Heart Surgery;  Laterality: Right; . REMOVAL OF CENTRIMAG VENTRICULAR ASSIST DEVICE N/A 07/17/2017  Procedure: REMOVAL OF RVAD WITH PUMP STANDBY;  Surgeon: Ivin Poot, MD;  Location: [facility];  Service: Open Heart Surgery;  Laterality: N/A; . REPLACEMENT ASCENDING AORTA N/A 07/10/2017  Procedure: REPLACEMENT ASCENDING AORTA;  Surgeon: Ivin Poot, MD;  Location: [facility];  Service: Open Heart Surgery;  Laterality: N/A; . right knee replacement  3/11  Dr. Gladstone Pih . RIGHT/LEFT HEART CATH AND CORONARY  ANGIOGRAPHY N/A 06/13/2017  Procedure: RIGHT/LEFT HEART CATH AND CORONARY ANGIOGRAPHY;  Surgeon: Jolaine Artist, MD;  Location: [facility];  Service: Cardiovascular;  Laterality: N/A; . rt. eye cataract  05/28/07 . TEE WITHOUT CARDIOVERSION N/A 07/10/2017  Procedure: TRANSESOPHAGEAL ECHOCARDIOGRAM (TEE);  Surgeon: [name], Collier Salina, MD;  Location: [facility];  Service: Open Heart Surgery;  Laterality: N/A; . TEE WITHOUT CARDIOVERSION  07/11/2017  Procedure: TRANSESOPHAGEAL ECHOCARDIOGRAM (TEE);  Surgeon: [name], Collier Salina, MD;  Location: [facility];  Service: Open Heart Surgery;; . TEE WITHOUT CARDIOVERSION N/A 07/17/2017  Procedure: TRANSESOPHAGEAL ECHOCARDIOGRAM (TEE);  Surgeon: [name], Collier Salina, MD;  Location: [facility];  Service: Open Heart Surgery;  Laterality: N/A; . THYROID LOBECTOMY Left 07/27/2015  Procedure: LEFT THYROID LOBECTOMY;  Surgeon: Fanny Skates, MD;  [facility];  Service: General;  Laterality: Left; . THYROIDECTOMY N/A 08/06/2015  Procedure: RIGHT THYROID LOBECTOMY, REIMPLANTATION PARATHYROID;  Surgeon: Fanny Skates, MD;  [facility];  Service: General;  Laterality: N/A; . VENTRAL HERNIA REPAIR  2/99 HPI: Pt admitted 07/10/17 for AVR and ascending AA replacement. Post procedure had VF arrest. 07/11/17 back to OR to evacuate hematoma and place RVAD due to fluid overload and R systolic HF. RVAD subsequently removed. Pt intubated from 07/10/17-07/19/17. Re-intubated 1/24-1/29; trach placed 1/29. PMH: PVD, afib, vfib, hypothyroidism, anxiety and depression, HTN, HLD, R TKA. Pt safe with PMV and alertness has improved consistently therefore appropriate for objective assessment.  No Data Recorded Assessment / Plan / Recommendation CHL IP CLINICAL IMPRESSIONS 08/07/2017 Clinical Impression Pt exhibits mild-mod oropharyngeal dsyphagia marked by motor weakness impairments rather than timing and coordination. PMV in place during study. Anterior hyolaryngeal excursion appeared adequate but elevation of  larynx decreased leading to reduced epiglottic deflection further impacted by nasal tube. Silent laryngeal penetration occured mostly during the swallow with all consistencies and once after from vallecular space. Verbal cues for hard cough effectively cleared most penetrated barium. Esophageal scanned revealed slower transit (not formally diagnosed with MBS). Recommend Dys 1 only, pudding thick liquids, meds crushed and removal of NGT after evidence of safe and efficient swallow and adequate quantity with recomendations.                                            SLP Visit Diagnosis Dysphagia, oropharyngeal phase (R13.12) Attention and concentration deficit following -- Frontal  lobe and executive function deficit following -- Impact on safety and function Moderate aspiration risk   CHL IP TREATMENT RECOMMENDATION 08/07/2017 Treatment Recommendations Therapy as outlined in treatment plan below   Prognosis 08/07/2017 Prognosis for Safe Diet Advancement Good Barriers to Reach Goals Cognitive deficits Barriers/Prognosis Comment -- CHL IP DIET RECOMMENDATION 08/07/2017 SLP Diet Recommendations Dysphagia 1 (Puree) solids;Pudding thick liquid;No liquids Liquid Administration via -- Medication Administration Crushed with puree Compensations Minimize environmental distractions;Slow rate;Small sips/bites;Clear throat after each swallow Postural Changes Seated upright at 90 degrees;Remain semi-upright after after feeds/meals (Comment)   CHL IP OTHER RECOMMENDATIONS 08/07/2017 Recommended Consults -- Oral Care Recommendations Oral care BID Other Recommendations Place PMSV during PO intake   CHL IP FOLLOW UP RECOMMENDATIONS 08/07/2017 Follow up Recommendations 24 hour supervision/assistance   CHL IP FREQUENCY AND DURATION 08/07/2017 Speech Therapy Frequency (ACUTE ONLY) min 2x/week Treatment Duration 2 weeks      CHL IP ORAL PHASE 08/07/2017 Oral Phase Impaired Oral - Pudding Teaspoon -- Oral - Pudding Cup -- Oral - Honey Teaspoon --  Oral - Honey Cup Weak lingual manipulation Oral - Nectar Teaspoon -- Oral - Nectar Cup Weak lingual manipulation Oral - Nectar Straw -- Oral - Thin Teaspoon -- Oral - Thin Cup -- Oral - Thin Straw -- Oral - Puree Weak lingual manipulation Oral - Mech Soft -- Oral - Regular -- Oral - Multi-Consistency -- Oral - Pill -- Oral Phase - Comment --  CHL IP PHARYNGEAL PHASE 08/07/2017 Pharyngeal Phase Impaired Pharyngeal- Pudding Teaspoon -- Pharyngeal -- Pharyngeal- Pudding Cup -- Pharyngeal -- Pharyngeal- Honey Teaspoon -- Pharyngeal -- Pharyngeal- Honey Cup Reduced laryngeal elevation;Penetration/Aspiration during swallow;Penetration/Apiration after swallow Pharyngeal Material enters airway, remains ABOVE vocal cords and not ejected out Pharyngeal- Nectar Teaspoon Reduced laryngeal elevation;Penetration/Aspiration during swallow;Reduced epiglottic inversion Pharyngeal Material enters airway, remains ABOVE vocal cords and not ejected out Pharyngeal- Nectar Cup Reduced laryngeal elevation;Penetration/Aspiration during swallow Pharyngeal Material enters airway, CONTACTS cords and not ejected out Pharyngeal- Nectar Straw -- Pharyngeal -- Pharyngeal- Thin Teaspoon -- Pharyngeal -- Pharyngeal- Thin Cup -- Pharyngeal -- Pharyngeal- Thin Straw -- Pharyngeal -- Pharyngeal- Puree Penetration/Apiration after swallow;Pharyngeal residue - valleculae Pharyngeal Material enters airway, remains ABOVE vocal cords and not ejected out Pharyngeal- Mechanical Soft -- Pharyngeal -- Pharyngeal- Regular -- Pharyngeal -- Pharyngeal- Multi-consistency -- Pharyngeal -- Pharyngeal- Pill -- Pharyngeal -- Pharyngeal Comment --  CHL IP CERVICAL ESOPHAGEAL PHASE 08/07/2017 Cervical Esophageal Phase WFL Pudding Teaspoon -- Pudding Cup -- Honey Teaspoon -- Honey Cup -- Nectar Teaspoon -- Nectar Cup -- Nectar Straw -- Thin Teaspoon -- Thin Cup -- Thin Straw -- Puree -- Mechanical Soft -- Regular -- Multi-consistency -- Pill -- Cervical Esophageal Comment --  No flowsheet data found. [name] 08/07/2017, 11:38 AM  [name].Ed CCC-SLP Pager [phone]             STUDIES: Echo12/12/2016 LVEF 55-60%, Grade 1 DD, Mild/Mod AI with LVOT, Mid ascending aortic diameter 48.88 mm, Mild LAE, RV normal Echo (07/22/17): EF 65-70%, bioprosthetic aortic valve functioning normally, PASP 33 mmHg, RV looks ok.   CULTURES 1/24 sputum >> oral flora  ANTIBIOTICS Ceftaz1/17 >> 1.20 Cefuroxime 1/21 >> 1/23 Ceftaz 1/23 >> 1/31  EVENTS 1/14  Admit 1/14 S/p AVR and ascending AA replacement  1/14 VF Arrest in evening with shock x 1 1/15 to OR for evacuation of hematoma and RVAD placement  1/16 PCCM consult 1/19 Lasix per cardiology,  CVP of 5 1/21 RVAD removed 1/22 heavily diuresed  1/23 ready for extubation. Atrial fib amio started.  1/24 reintubated 1/25  Afebrile 1/29  trach placed 2/01  Tolerating PMV with SLP   I reviewed CXR myself, trach is in good position  ASSESSMENT / PLAN: 77 year old female with PMHx significant for fusiform ascending aortic aneurysm, hypertension, hyperlipidemia, status post AVR and replacement of ascending aortic aneurysm.  On 07/10/2017 postprocedure patient developed acute cardiogenic shock had an episode of V. fib arrest was taken back to the OR on 07/11/2017 for evacuation of hematoma and placement of RVAD.  RVAD now removed, failed extubation 1/24 but continued to wean well. Underwent tracheostomy 1/29.  Much improved.  Discussed with PCCM-NP  ASSESSMENT / PLAN:  Chronic respiratory failure:  - Monitor for airway protection  Dysphagia:  - Change trach to a cuffless 4 to assist with swallowing per discussion with speech pathology  Hypoxemia:  - Titrate O2 for sat of 88-92%  - Goal of 28%  Trach status:  - Change to a cuffless 4 today and if able to pass swallow evaluation and demonstrate airway protection ability then will cap and work towards decannulation.  Hypokalemia:  - Replace K  - BMET  in AM  PCCM will follow for trach management.  Rush Farmer, M.D. [facility] Pulmonary/Critical Care Medicine. Pager: [phone]. After hours pager: [phone].

## 2017-08-08 NOTE — Progress Notes (Signed)
Nutrition Follow-up  INTERVENTION:   Magic cup TID with meals, each supplement provides 290 kcal and 9 grams of protein  Continue via Cortrak: Vital 1.5 @ 40 ml/hr (960 ml/day) 30 ml Prostat TID  Provides: 1740 kcal, 109 grams protein, and 733 ml free water.    NUTRITION DIAGNOSIS:   Inadequate oral intake related to dysphagia as evidenced by meal completion < 50%. Ongoing.   GOAL:   Patient will meet greater than or equal to 90% of their needs Progressing.   MONITOR:   TF tolerance, Diet advancement, PO intake  ASSESSMENT:   Pt with PMH of HTN, HLD, B12 deficiency admitted 1/14 for planned AVR and replacement of ascending aortic aneurysm. Pt developed acute cardiogenic shock and VF arrest. OR 1/15 for evac of hematoma and placement of RVAD/centromag.   2/11 diet advanced to Dysphagia 1 with Pudding thick liquids, per pt she does not like the food and has only had bites of magic cup so far. Husband at bedside.   Medications reviewed and include: lasix, levemir, SSI, senokot (held) Labs reviewed: K+ 3.4 (L)  1/14 S/p AVR and ascending AA replacement  1/14 VF Arrest in evening with shock x 1 1/15 to OR for evacuation of hematoma and RVAD placement 1/21 RVAD removed, sternum closed 1/29 trach 2/3 off vent  Diet Order:  DIET - DYS 1 Room service appropriate? Yes; Fluid consistency: Pudding Thick  EDUCATION NEEDS:   No education needs have been identified at this time  Skin:  Skin Assessment: Reviewed RN Assessment(MASD abdomen) Skin Integrity Issues:: Stage II Stage II: sacrum  Last BM:  2/12 small type 7  Height:   Ht Readings from Last 1 Encounters:  07/27/17 5\' 5"  (1.651 m)    Weight:   Wt Readings from Last 1 Encounters:  08/08/17 166 lb 10.7 oz (75.6 kg)    Ideal Body Weight:  56.8 kg  BMI:  Body mass index is 27.73 kg/m.  Estimated Nutritional Needs:   Kcal:  1600-1800  Protein:  110-120 grams  Fluid:  > 1.5 L/day  Maylon Peppers RD,  LDN, CNSC [phone] Pager [phone] After Hours Pager

## 2017-08-08 NOTE — Progress Notes (Signed)
  Speech Language Pathology Treatment: Dysphagia  Patient Details Name: [name] MRN: [id number] DOB: 27-Nov-1940 Today's Date: 08/08/2017 Time: 5643-3295 SLP Time Calculation (min) (ACUTE ONLY): 15 min  Assessment / Plan / Recommendation Clinical Impression  Pt alert with PMV in place with intelligible, hoarse vocal quality with mildly decreased intensity this session. Vital signs all stable. Observed with pudding thick orange juice and 1 bite vanilla pudding. She self fed with assist for set up with utensil. Oral transit timely with adequate clearance and no indications of decreased airway protection. Intake limited due to taste/appetite and or combination. Pt's husband not present to educate re: yesterday's MBS results. Continue Dys 1, pudding thick liquids, PMV on with meals/meds, sit upright and crush meds. Will continue to follow for safety and upgrade po's; will need objective test to upgrade to liquids.     HPI HPI: Pt admitted 07/10/17 for AVR and ascending AA replacement. Post procedure had VF arrest. 07/11/17 back to OR to evacuate hematoma and place RVAD due to fluid overload and R systolic HF. RVAD subsequently removed. Pt intubated from 07/10/17-07/19/17. Re-intubated 1/24-1/29; trach placed 1/29. PMH: PVD, afib, vfib, hypothyroidism, anxiety and depression, HTN, HLD, R TKA. Pt safe with PMV and alertness has improved consistently therefore appropriate for objective assessment.      SLP Plan  Continue with current plan of care       Recommendations  Diet recommendations: Dysphagia 1 (puree);Pudding-thick liquid Liquids provided via: Teaspoon Medication Administration: Crushed with puree Supervision: Patient able to self feed;Full supervision/cueing for compensatory strategies Compensations: Minimize environmental distractions;Slow rate;Small sips/bites Postural Changes and/or Swallow Maneuvers: Seated upright 90 degrees      Patient may use Passy-Muir Speech Valve: During all  therapies with supervision PMSV Supervision: Full MD: Please consider changing trach tube to : Cuffless(MD changing to cuffless #4)         Oral Care Recommendations: Oral care BID Follow up Recommendations: Inpatient Rehab SLP Visit Diagnosis: Dysphagia, oropharyngeal phase (R13.12) Plan: Continue with current plan of care                       [name] 08/08/2017, 10:25 AM   [name].Ed [employer] [phone]

## 2017-08-08 NOTE — Progress Notes (Signed)
22 Days Post-Op Procedure(s) (LRB): REMOVAL OF RVAD WITH PUMP STANDBY (N/A) TRANSESOPHAGEAL ECHOCARDIOGRAM (TEE) (N/A) Subjective: Alert OOB to chair Started D-I diet after MBS CXR today clear Still with coughing episodes due to secretions nsr  Objective: Vital signs in last 24 hours: Temp:  [97.6 F (36.4 C)-98.5 F (36.9 C)] 98.5 F (36.9 C) (02/12 1200) Pulse Rate:  [87-97] 92 (02/12 1400) Cardiac Rhythm: Normal sinus rhythm (02/12 1400) Resp:  [14-25] 25 (02/12 1400) BP: (86-160)/(51-93) 147/77 (02/12 1400) SpO2:  [92 %-100 %] 96 % (02/12 1400) FiO2 (%):  [28 %] 28 % (02/12 1400) Weight:  [166 lb 10.7 oz (75.6 kg)] 166 lb 10.7 oz (75.6 kg) (02/12 0500)  Hemodynamic parameters for last 24 hours:    Intake/Output from previous day: 02/11 0701 - 02/12 0700 In: 1110 [NG/GT:960; IV Piggyback:150] Out: 950 [Urine:950] Intake/Output this shift: Total I/O In: 630 [P.O.:200; NG/GT:280; IV Piggyback:150] Out: 50 [Urine:50]       Exam    General- alert and comfortable    Neck- no JVD, no cervical adenopathy palpable, no carotid bruit   Lungs- clear without rales, wheezes. Trach in place, speech very good with PM valve   Cor- regular rate and rhythm, no murmur , gallop   Abdomen- soft, non-tender   Extremities - warm, non-tender, minimal edema   Neuro- oriented, appropriate, no focal weakness   Lab Results: Recent Labs    08/07/17 0325  WBC 9.6  HGB 12.5  HCT 41.2  PLT 277   BMET:  Recent Labs    08/07/17 0325 08/08/17 0334  NA 145 145  K 3.4* 3.4*  CL 105 105  CO2 27 27  GLUCOSE 114* 107*  BUN 41* 39*  CREATININE 0.66 0.59  CALCIUM 7.5* 7.5*    PT/INR: No results for input(s): LABPROT, INR in the last 72 hours. ABG    Component Value Date/Time   PHART 7.502 (H) 08/03/2017 0347   HCO3 27.4 08/03/2017 0347   TCO2 28 08/03/2017 0347   ACIDBASEDEF 1.0 07/18/2017 0404   O2SAT 90.0 08/03/2017 0347   CBG (last 3)  Recent Labs    08/08/17 0333  08/08/17 0831 08/08/17 1153  GLUCAP 89 100* 98    Assessment/Plan: S/P Procedure(s) (LRB): REMOVAL OF RVAD WITH PUMP STANDBY (N/A) TRANSESOPHAGEAL ECHOCARDIOGRAM (TEE) (N/A) Mobilize goal is CIR when she can ambulate   LOS: 29 days    [name] 08/08/2017

## 2017-08-08 NOTE — Procedures (Signed)
Tracheostomy Change Note  Patient Details:   Name: [name] DOB: [birth year] MRN: [id number]    Airway Documentation:     Evaluation  O2 sats: stable throughout Complications: No apparent complications Patient did tolerate procedure well. Bilateral Breath Sounds: Clear, Diminished   Lurline Idol was changed to a #4 cuffless shiley without any complications. Positive ETCO2, trach was secured with trach ties.     Ming Kunka, Eddie [place] 08/08/2017, 11:59 PM

## 2017-08-08 NOTE — Progress Notes (Addendum)
Physical Therapy Treatment Patient Details Name: [name] MRN: [id number] DOB: [birth year] Today's Date: 08/08/2017    History of Present Illness Pt admitted 07/10/17 for AVR and ascending AA replacement. Post procedure had VF arrest. 07/11/17 back to OR to evacuate hematoma and place RVAD due to fluid overload and R systolic HF. RVAD subsequently removed. Pt intubated from 07/10/17-07/19/17. Re-intubated 1/24-1/29; trach placed 1/29. PMH: PVD, afib, vfib, hypothyroidism, anxiety and depression, HTN, HLD, R TKA.    PT Comments    Pt performed increased activity remains to present with balance and coordination deficits.  A total of 4 staff members needed to ambulate patient with +2 for equipment management and +2 for physical assistance to facilitate trunk control and progress ambulation.  Plan for CIR remains appropriate as patient continues to progress.     140/89 pre tx 145/68 post tx    Follow Up Recommendations  CIR     Equipment Recommendations  (TBD at next venue.  )    Recommendations for Other Services Rehab consult     Precautions / Restrictions Precautions Precautions: Fall;Sternal Precaution Comments: Reinforced sternal precautions Restrictions Weight Bearing Restrictions: No Other Position/Activity Restrictions: sternal precautions    Mobility  Bed Mobility Overal bed mobility: Needs Assistance Bed Mobility: Supine to Sit     Supine to sit: Min assist;HOB elevated Sit to supine: Mod assist(A to lift leg and help transition trunk)   General bed mobility comments: Pt does not follow through with sternal precautions and requires physical cuing to maintian precautions, continues to attempt to reach back to push and required cues to keep hands in her lap.    Transfers Overall transfer level: Needs assistance Equipment used: 4-wheeled walker(EVA walker) Transfers: Sit to/from Stand Sit to Stand: Max assist;+2 physical assistance Stand pivot transfers: +2  physical assistance;Max assist       General transfer comment: Cues for hand placement on knees to and from seated surface to maintain sternal precautions.  Pt required cues for hip extension and anterior translation into standing.  Pt walks B feet forward and flexes B hips.  Pt unable to correct with VCs and requires tactile cues to facilitate extension.    Ambulation/Gait Ambulation/Gait assistance: Max assist;+2 physical assistance Ambulation Distance (Feet): 24 Feet Assistive device: 4-wheeled walker(EVA walker) Gait Pattern/deviations: Shuffle;Trunk flexed;Decreased stride length;Leaning posteriorly;Narrow base of support;Scissoring Gait velocity: Decreased Gait velocity interpretation: Below normal speed for age/gender General Gait Details: Unsteady ambulation using Eva walker and maxA+2 to maintain balance and trunk elevation, and control walker. Pt with significant posterior lean, with feet too far forward and increased difficulty translating her trunk forward despite multimodal cues. Pt presents with scissoring narrow BOS and cues to increase BOS.     Stairs            Wheelchair Mobility    Modified Rankin (Stroke Patients Only)       Balance Overall balance assessment: Needs assistance Sitting-balance support: Feet supported;Bilateral upper extremity supported Sitting balance-Leahy Scale: Fair Sitting balance - Comments: Pt remains to push posterior when scooting to edge of bed.  Pt required cues for anterior positioning of pelvis to improve sitting posture.   Postural control: Posterior lean   Standing balance-Leahy Scale: Poor Standing balance comment: posterior lean, forward placement of B feet and resistant to facilitation to correct.                              Cognition Arousal/Alertness:  Awake/alert Behavior During Therapy: Anxious Overall Cognitive Status: Impaired/Different from baseline Area of Impairment:  Attention;Memory;Safety/judgement;Awareness;Problem solving                   Current Attention Level: Sustained Memory: Decreased recall of precautions;Decreased short-term memory Following Commands: Follows one step commands consistently Safety/Judgement: Decreased awareness of safety;Decreased awareness of deficits Awareness: Emergent Problem Solving: Slow processing;Difficulty sequencing;Requires tactile cues;Requires verbal cues General Comments: tangential at times; talking about doing rehab at "home"      Exercises Other Exercises Other Exercises: facilitation of anterior weight shifts and forard movement during trnasitional movement s by suing over the back techniques with facilitation through pt's opposite hip/shoulder. Pt with good response to facilitation and able to facilitate forward weight shift of hips off bed with mod A.    General Comments        Pertinent Vitals/Pain Pain Assessment: Faces Faces Pain Scale: Hurts little more Pain Location: generalized discomfort Pain Descriptors / Indicators: Grimacing;Guarding Pain Intervention(s): Monitored during session;Repositioned    Home Living                      Prior Function            PT Goals (current goals can now be found in the care plan section) Acute Rehab PT Goals Patient Stated Goal: to walk Potential to Achieve Goals: Good Progress towards PT goals: Progressing toward goals    Frequency    Min 3X/week      PT Plan Current plan remains appropriate    Co-evaluation              AM-PAC PT "6 Clicks" Daily Activity  Outcome Measure  Difficulty turning over in bed (including adjusting bedclothes, sheets and blankets)?: Unable Difficulty moving from lying on back to sitting on the side of the bed? : Unable Difficulty sitting down on and standing up from a chair with arms (e.g., wheelchair, bedside commode, etc,.)?: Unable Help needed moving to and from a bed to chair  (including a wheelchair)?: A Lot Help needed walking in hospital room?: A Lot Help needed climbing 3-5 steps with a railing? : Total 6 Click Score: 8    End of Session Equipment Utilized During Treatment: Gait belt;Oxygen Activity Tolerance: Patient tolerated treatment well;Patient limited by fatigue Patient left: in bed;with call bell/phone within reach;with bed alarm set Nurse Communication: Mobility status PT Visit Diagnosis: Other abnormalities of gait and mobility (R26.89);Muscle weakness (generalized) (M62.81)     Time: 1321-1350 PT Time Calculation (min) (ACUTE ONLY): 29 min  Charges:  $Gait Training: 8-22 mins $Therapeutic Activity: 8-22 mins                    G Codes:       Governor Rooks, PTA pager [phone]    Cristela Blue 08/08/2017, 3:58 PM

## 2017-08-08 NOTE — Progress Notes (Signed)
Patient ID: [name], female   DOB: [birth year], 77 y.o.   MRN: [id number] EVENING ROUNDS NOTE :     [address]       [place] [address]             [phone]                 22 Days Post-Op Procedure(s) (LRB): REMOVAL OF RVAD WITH PUMP STANDBY (N/A) TRANSESOPHAGEAL ECHOCARDIOGRAM (TEE) (N/A)  Total Length of Stay:  LOS: 29 days  BP (!) 154/96 (BP Location: Left Arm)   Pulse 94   Temp 98.1 F (36.7 C) (Oral)   Resp (!) 25   Ht 5\' 5"  (1.651 m)   Wt 166 lb 10.7 oz (75.6 kg)   SpO2 98%   BMI 27.73 kg/m   .Intake/Output      02/12 0701 - 02/13 0700   P.O. 200   NG/GT 440   IV Piggyback 150   Total Intake(mL/kg) 790 (10.4)   Urine (mL/kg/hr) 250 (0.3)   Stool 0   Total Output 250   Net +540       Urine Occurrence 1 x   Stool Occurrence 1 x     . sodium chloride 250 mL (07/22/17 1820)  . feeding supplement (VITAL 1.5 CAL) 1,000 mL (08/08/17 1800)  . lactated ringers Stopped (07/17/17 1200)     Lab Results  Component Value Date   WBC 9.6 08/07/2017   HGB 12.5 08/07/2017   HCT 41.2 08/07/2017   PLT 277 08/07/2017   GLUCOSE 107 (H) 08/08/2017   CHOL 165 03/15/2017   TRIG 530 (H) 07/23/2017   HDL 57 03/15/2017   LDLCALC 89 03/15/2017   ALT 61 (H) 07/29/2017   AST 104 (H) 07/29/2017   NA 145 08/08/2017   K 3.4 (L) 08/08/2017   CL 105 08/08/2017   CREATININE 0.59 08/08/2017   BUN 39 (H) 08/08/2017   CO2 27 08/08/2017   TSH 0.990 03/15/2017   INR 1.24 07/25/2017   HGBA1C 5.2 07/06/2017    Stable day  Grace Isaac MD  Beeper [phone] Office [phone] 08/08/2017 7:01 PM

## 2017-08-08 NOTE — Progress Notes (Signed)
Occupational Therapy Treatment Patient Details Name: JENNEFER KOPP MRN: [id number] DOB: June 19, 1941 Today's Date: 08/08/2017    History of present illness Pt admitted 07/10/17 for AVR and ascending AA replacement. Post procedure had VF arrest. 07/11/17 back to OR to evacuate hematoma and place RVAD due to fluid overload and R systolic HF. RVAD subsequently removed. Pt intubated from 07/10/17-07/19/17. Re-intubated 1/24-1/29; trach placed 1/29. PMH: PVD, afib, vfib, hypothyroidism, anxiety and depression, HTN, HLD, R TKA.   OT comments  Pt has made significant progress over the the last week. Able to move to EOB with HOB elevated with min A and maintain balance EOB with S. Pt demonstrating increased BUE strength and beginning to participate in more ADL tasks. VSS throughout session.  Feel pt is an appropriate CIR candidate given her amount of recent progress. Pt is motivated to improve and has a very supportive husband. Will continue to follow acutely to address established goals and continue to progress rehab.   Follow Up Recommendations  CIR    Equipment Recommendations  3 in 1 bedside commode    Recommendations for Other Services Rehab consult    Precautions / Restrictions Precautions Precautions: Fall;Sternal       Mobility Bed Mobility Overal bed mobility: Needs Assistance Bed Mobility: Supine to Sit;Sit to Supine     Supine to sit: Min assist;HOB elevated Sit to supine: Mod assist(A to lift leg and help transition trunk)   General bed mobility comments: Pt does not follow through with sternal precautions and requires physical cuing to maintian precautions  Transfers                      Balance     Sitting balance-Leahy Scale: Fair Sitting balance - Comments: increased upright midlien postural control this session; R bias     Standing balance-Leahy Scale: Poor Standing balance comment: posterior lean  Poor awareness of BLE positioning during mobility                            ADL either performed or assessed with clinical judgement   ADL Overall ADL's : Needs assistance/impaired Eating/Feeding: Moderate assistance Eating/Feeding Details (indicate cue type and reason): pudding thick liquids 2/12 Grooming: Minimal assistance;Wash/dry face;Brushing hair                                 General ADL Comments: Used tray mirror to facilitate anterior weight shifts during ADL tasks. Pt states she will do better if she has her tennis shoes; husband present at end of session and asked to bring in shoes. Speech hasd finished prior to OT session adn pt is on pudding thick diet. Pt requetingto drink a coke. PT demonstrates poor insight into understandingher swallowing restricitons     Vision       Perception     Praxis      Cognition Arousal/Alertness: Awake/alert Behavior During Therapy: Anxious Overall Cognitive Status: Impaired/Different from baseline Area of Impairment: Attention;Memory;Safety/judgement;Awareness;Problem solving                   Current Attention Level: Sustained Memory: Decreased recall of precautions;Decreased short-term memory Following Commands: Follows one step commands consistently Safety/Judgement: Decreased awareness of safety;Decreased awareness of deficits Awareness: Emergent Problem Solving: Slow processing;Difficulty sequencing;Requires tactile cues;Requires verbal cues General Comments: tangential at times; talking about doing rehab at "home"  Exercises Other Exercises Other Exercises: facilitation of anterior weight shifts and forard movement during trnasitional movement s by suing over the back techniques with facilitation through pt's opposite hip/shoulder. Pt with good response to facilitation and able to facilitate forward weight shift of hips off bed with mod A.   Shoulder Instructions       General Comments      Pertinent Vitals/ Pain       Pain Assessment:  Faces Faces Pain Scale: Hurts little more Pain Location: generalized discomfort Pain Descriptors / Indicators: Grimacing;Guarding Pain Intervention(s): Limited activity within patient's tolerance  Home Living                                          Prior Functioning/Environment              Frequency  Min 3X/week        Progress Toward Goals  OT Goals(current goals can now be found in the care plan section)  Progress towards OT goals: Progressing toward goals  Acute Rehab OT Goals Patient Stated Goal: to walk OT Goal Formulation: With patient Time For Goal Achievement: 08/17/17 Potential to Achieve Goals: Good ADL Goals Pt Will Perform Eating: Independently;sitting Pt Will Perform Grooming: with min assist;sitting Pt Will Perform Upper Body Dressing: with min assist;sitting Pt Will Transfer to Toilet: with mod assist;stand pivot transfer;bedside commode Pt Will Perform Toileting - Clothing Manipulation and hygiene: with min assist;sit to/from stand Additional ADL Goal #1: Pt will state sternal precautions. Additional ADL Goal #2: Pt will sit EOB x 10 minutes with supervision in preparation for ADL.  Plan Discharge plan remains appropriate;Frequency needs to be updated    Co-evaluation                 AM-PAC PT "6 Clicks" Daily Activity     Outcome Measure   Help from another person eating meals?: A Lot Help from another person taking care of personal grooming?: A Lot Help from another person toileting, which includes using toliet, bedpan, or urinal?: A Lot Help from another person bathing (including washing, rinsing, drying)?: A Lot Help from another person to put on and taking off regular upper body clothing?: A Lot Help from another person to put on and taking off regular lower body clothing?: Total 6 Click Score: 11    End of Session Equipment Utilized During Treatment: Oxygen  OT Visit Diagnosis: Unsteadiness on feet  (R26.81);Muscle weakness (generalized) (M62.81);Other symptoms and signs involving cognitive function Pain - part of body: (general discomfort)   Activity Tolerance Patient tolerated treatment well   Patient Left in bed;with call bell/phone within reach;with bed alarm set;with family/visitor present   Nurse Communication Mobility status        Time: 1010-1040 OT Time Calculation (min): 30 min  Charges: OT General Charges $OT Visit: 1 Visit OT Treatments $Self Care/Home Management : 8-22 mins $Therapeutic Activity: 8-22 mins  [facility], OT/L  [phone] 08/08/2017   Trinity Hyland,HILLARY 08/08/2017, 1:49 PM

## 2017-08-09 ENCOUNTER — Ambulatory Visit: Payer: Medicare Other | Admitting: Family Medicine

## 2017-08-09 LAB — BASIC METABOLIC PANEL
ANION GAP: 12 (ref 5–15)
BUN: 36 mg/dL — ABNORMAL HIGH (ref 6–20)
CHLORIDE: 106 mmol/L (ref 101–111)
CO2: 28 mmol/L (ref 22–32)
Calcium: 7.4 mg/dL — ABNORMAL LOW (ref 8.9–10.3)
Creatinine, Ser: 0.58 mg/dL (ref 0.44–1.00)
GFR calc non Af Amer: >60 mL/min (ref >60)
Glucose, Bld: 106 mg/dL — ABNORMAL HIGH (ref 65–99)
Potassium: 3.1 mmol/L — ABNORMAL LOW (ref 3.5–5.1)
SODIUM: 146 mmol/L — AB (ref 135–145)

## 2017-08-09 LAB — GLUCOSE, CAPILLARY
Glucose-Capillary: 107 mg/dL — ABNORMAL HIGH (ref 65–99)
Glucose-Capillary: 100 mg/dL — ABNORMAL HIGH (ref 65–99)
Glucose-Capillary: 116 mg/dL — ABNORMAL HIGH (ref 65–99)
Glucose-Capillary: 117 mg/dL — ABNORMAL HIGH (ref 65–99)

## 2017-08-09 LAB — PHOSPHORUS: PHOSPHORUS: 3.1 mg/dL (ref 2.5–4.6)

## 2017-08-09 LAB — MAGNESIUM: MAGNESIUM: 1.9 mg/dL (ref 1.7–2.4)

## 2017-08-09 MED ORDER — POTASSIUM CHLORIDE 10 MEQ/50ML IV SOLN
10.0000 meq | INTRAVENOUS | Status: AC
  Administered 2017-08-09 (×4): 10 meq via INTRAVENOUS
  Filled 2017-08-09: qty 50

## 2017-08-09 MED ORDER — POTASSIUM CHLORIDE 10 MEQ/50ML IV SOLN: 10.0000 meq | INTRAVENOUS | Status: AC

## 2017-08-09 NOTE — Care Management Note (Addendum)
Case Management Note  Patient Details  Name: [name] MRN: [id number] Date of Birth: 06/23/1941  Subjective/Objective:   From home admitted on 07/10/17 for planned AVR and replacement of ascending aortic aneurysm. Developed acute cardiogenic shock and VF arrest. Taken back to OR am of 07/11/17 for evacuation of hematoma and placement of RVAD.Remains intubated and sedated.   1/23 [place], BSN- PT is rec CIR consult, MD notified.    1/24 [place], BSN - reintubated, on full vent support, for possible trach next week.  conts on pressors and inotropes.   1/28 Tomi Bamberger RN, BSN - POD 7 RVAD removal, failed extubation on 124, now tolerating pressure support again, cvts requesting Trach, conts on milrinone, levophed, fentanyl drip and amio.  2/11 Tomi Bamberger RN, BSN - POD 21 RVAD removal, on trach collar at 28% since Saturday, toleratin PMV.  Passed MBS yesterday, advancing to dysphagia 1 diet, will change to cuffless 4 and work way to decannulation, progressing slowly with PT. Plan for CIR vs SNF.  2/13 Tomi Bamberger RN, BSN- Trach downsized to cuffless 4, tolerating pmv, per PCCM will cap  Lurline Idol, if tolerates, will decannulate by Friday, she is tolerating dysphagia diet, CIR coordinator will initiate auth on 2/14 to try for admit to CIR on Friday.  2/14 [place], BSN - trach capped and did not tolerate, Placed back on ATC at 28%.  2/15 [place], BSN- CIR coordinated has received authorization for admit for 2/15 or 2/16 depending on bed availability.                        Action/Plan: NCM will follow for transition of care.  Expected Discharge Date:                  Expected Discharge Plan:  IP Rehab Facility  In-House Referral:  Clinical Social Work  Discharge planning Services  CM Consult  Post Acute Care Choice:    Choice offered to:     DME Arranged:    DME Agency:     HH Arranged:    [place] Agency:      Status of Service:  In process, will continue to follow  If discussed at Long Length of Stay Meetings, dates discussed:    Additional Comments:  Zenon Mayo, RN 08/09/2017, 8:18 PM

## 2017-08-09 NOTE — Progress Notes (Signed)
[facility] ADULT ICU REPLACEMENT PROTOCOL FOR AM LAB REPLACEMENT ONLY  The patient does apply for the [facility] Adult ICU Electrolyte Replacment Protocol based on the criteria listed below:   1. Is GFR >/= 40 ml/min? Yes.    Patient's GFR today is >60 2. Is urine output >/= 0.5 ml/kg/hr for the last 6 hours? Yes.   Patient's UOP is 0.8 ml/kg/hr 3. Is BUN < 60 mg/dL? Yes.    Patient's BUN today is 36 4. Abnormal electrolyte(s):K+=3.1 5. Ordered repletion with: per protocol 6. If a panic level lab has been reported, has the CCM MD in charge been notified? Yes.  .   Physician:  Deterding,E MD  Vilma Prader 08/09/2017 5:09 AM

## 2017-08-09 NOTE — Progress Notes (Signed)
CT surgery p.m. Rounds  Pulmonary status stable, tolerated trach downsize Tolerating a dysphagia diet, maintaining tube feeds to provide adequate nutrition Sinus rhythm No fever off antibiotics Airway secretions much improved Goal is transfer to CIR later this week

## 2017-08-09 NOTE — Progress Notes (Signed)
..  Name: [name] MRN: [id number] DOB: 1941-05-23    ADMISSION DATE:  07/10/2017  CONSULTATION DATE:  07/12/17  REFERRING MD :  Nils Pyle MD   BRIEF:   77 year old female with past medical history significant for fusiform ascending aortic aneurysm, hypertension, hyperlipidemia, status post AVR and replacement of ascending aortic aneurysm.  On 07/10/2017 postprocedure patient developed acute cardiogenic shock had an episode of V. fib arrest was taken back to the OR on 07/11/2017 for evacuation of hematoma and placement of RVAD. She had prolonged mechanical ventilation requiring tracheostomy.   SUBJECTIVE:   No events overnight, tolerated cuffless 4 without difficulty  VITAL SIGNS: Temp:  [97.6 F (36.4 C)-98.2 F (36.8 C)] 98.2 F (36.8 C) (02/13 1200) Pulse Rate:  [85-102] 88 (02/13 1200) Resp:  [14-25] 17 (02/13 1200) BP: (91-154)/(53-105) 130/89 (02/13 1200) SpO2:  [96 %-100 %] 99 % (02/13 1200) FiO2 (%):  [28 %] 28 % (02/13 1200) Weight:  [73.1 kg (161 lb 2.5 oz)] 73.1 kg (161 lb 2.5 oz) (02/13 0500)   Filed Weights   08/07/17 0404 08/08/17 0500 08/09/17 0500  Weight: 75.5 kg (166 lb 7.2 oz) 75.6 kg (166 lb 10.7 oz) 73.1 kg (161 lb 2.5 oz)    Intake/Output Summary (Last 24 hours) at 08/09/2017 1248 Last data filed at 08/09/2017 1200 Gross per 24 hour  Intake 1245 ml  Output 1450 ml  Net -205 ml   PHYSICAL EXAMINATION: General: Chronically ill appearing female, NAD HEENT: Trach in place with PMV, [place]/AT, PERRL, EOM-I and MMM Neuro: Alert and interactive, moving all ext to command CV: RRR, Nl S1/S2 and -M/R/G PULM: CTA bilaterally GI: Soft, NT, ND and +BS Extremities: Bilateral edema improving Skin: No rash  PULMONARY  CBC Recent Labs  Lab 08/05/17 0405 08/07/17 0325  HGB 12.0 12.5  HCT 40.3 41.2  WBC 11.0* 9.6  PLT 324 277   COAGULATION No results for input(s): INR in the last 168 hours.  CARDIAC  No results for input(s): TROPONINI in the last  168 hours. No results for input(s): PROBNP in the last 168 hours.  CHEMISTRY Recent Labs  Lab 08/03/17 0548  08/05/17 0405 08/06/17 0223 08/07/17 0325 08/08/17 0334 08/09/17 0302  NA 145   < > 144 143 145 145 146*  K 4.0   < > 3.2* 3.2* 3.4* 3.4* 3.1*  CL 105   < > 105 104 105 105 106  CO2 26   < > 27 26 27 27 28   GLUCOSE 160*   < > 116* 120* 114* 107* 106*  BUN 33*   < > 38* 41* 41* 39* 36*  CREATININE 0.67   < > 0.65 0.66 0.66 0.59 0.58  CALCIUM 8.0*   < > 7.6* 7.5* 7.5* 7.5* 7.4*  MG 2.2  --   --   --   --   --  1.9  PHOS  --   --   --   --   --   --  3.1   < > = values in this interval not displayed.   Estimated Creatinine Clearance: 60.8 mL/min (by C-G formula based on SCr of 0.58 mg/dL).  LIVER No results for input(s): AST, ALT, ALKPHOS, BILITOT, PROT, ALBUMIN, INR in the last 168 hours.  INFECTIOUS No results for input(s): LATICACIDVEN, PROCALCITON in the last 168 hours.  ENDOCRINE CBG (last 3)  Recent Labs    08/09/17 0341 08/09/17 0735 08/09/17 1116  GLUCAP 107* 100* 116*   IMAGING  Dg Chest Port 1 View  Result Date: 08/08/2017 CLINICAL DATA:  77 year old female with history of shortness of breath. EXAM: PORTABLE CHEST 1 VIEW COMPARISON:  Chest x-ray 08/05/2017. FINDINGS: There is a right upper extremity PICC with tip terminating in the right atrium. A feeding tube is seen extending into the abdomen, however, the tip of the feeding tube extends below the lower margin of the image. A tracheostomy tube is in place with tip 4.7 cm above the carina. Lung volumes are low. Some linear bibasilar opacities are most compatible with areas of subsegmental atelectasis. No definite consolidative airspace disease. No pleural effusions. No pneumothorax. No evidence of pulmonary edema. Heart size is mildly enlarged. Upper mediastinal contours are within normal limits allowing for patient rotation. Status post median sternotomy for aortic valve replacement (a stented bioprosthesis  is noted). Previously noted skin staples projecting over the upper right hemithorax have been removed. Multiple surgical clips are again noted in the right axillary region and immediately above the level of thoracic inlet. IMPRESSION: 1. Postoperative changes and support apparatus, as above. 2. Low lung volumes with bibasilar areas of subsegmental atelectasis. Electronically Signed   By: Vinnie Langton M.D.   On: 08/08/2017 08:04   STUDIES: Echo12/12/2016 LVEF 55-60%, Grade 1 DD, Mild/Mod AI with LVOT, Mid ascending aortic diameter 48.88 mm, Mild LAE, RV normal Echo (07/22/17): EF 65-70%, bioprosthetic aortic valve functioning normally, PASP 33 mmHg, RV looks ok.   CULTURES 1/24 sputum >> oral flora  ANTIBIOTICS Ceftaz1/17 >> 1.20 Cefuroxime 1/21 >> 1/23 Ceftaz 1/23 >> 1/31  EVENTS 1/14  Admit 1/14 S/p AVR and ascending AA replacement  1/14 VF Arrest in evening with shock x 1 1/15 to OR for evacuation of hematoma and RVAD placement  1/16 PCCM consult 1/19 Lasix per cardiology,  CVP of 5 1/21 RVAD removed 1/22 heavily diuresed  1/23 ready for extubation. Atrial fib amio started.  1/24 reintubated 1/25  Afebrile 1/29  trach placed 2/01  Tolerating PMV with SLP   I reviewed CXR myself, trach is in good position  ASSESSMENT / PLAN: 77 year old female with PMHx significant for fusiform ascending aortic aneurysm, hypertension, hyperlipidemia, status post AVR and replacement of ascending aortic aneurysm.  On 07/10/2017 postprocedure patient developed acute cardiogenic shock had an episode of V. fib arrest was taken back to the OR on 07/11/2017 for evacuation of hematoma and placement of RVAD.  RVAD now removed, failed extubation 1/24 but continued to wean well. Underwent tracheostomy 1/29.  Much improved.  Discussed with PCCM-NP  ASSESSMENT / PLAN:  Chronic respiratory failure:  - Monitor closely for airway protection  Dysphagia:  - Per speech  Hypoxemia:  - Titrate O2 for sat of  88-92%  - Change to a [place] once trach is capped  Trach status:  - Tolerated trach change to cuffless 4 without difficulty, tolerating PMV, will cap trach, if tolerated overnight by Friday will decannulate.  PCCM will follow for trach management.  Rush Farmer, M.D. [facility] Pulmonary/Critical Care Medicine. Pager: [phone]. After hours pager: [phone].

## 2017-08-09 NOTE — Progress Notes (Signed)
RT note- Attempted to plug trach. Patient did not tolerate, she felt that she could not get enough air in but could vocalize. Vocalization was not as strong as with PMV. Will attempt again today. Currently continues to wear PMV.

## 2017-08-09 NOTE — Progress Notes (Signed)
Inpatient Rehabilitation  Note progress with therapy as well as trach downsize yesterday.  Plan to initiate insurance authorization with [facility] tomorrow, 2/14 after therapy in hopes of potential admission Friday,2/15.  Discussed with nurse case manger.  Call if questions.   Carmelia Roller., CCC/SLP Admission Coordinator  [facility]  Cell [phone]

## 2017-08-09 NOTE — Progress Notes (Signed)
23 Days Post-Op Procedure(s) (LRB): REMOVAL OF RVAD WITH PUMP STANDBY (N/A) TRANSESOPHAGEAL ECHOCARDIOGRAM (TEE) (N/A) Subjective: OOB in chair Appears stronger Wants water nsr Trach downsized  Objective: Vital signs in last 24 hours: Temp:  [97.6 F (36.4 C)-98.5 F (36.9 C)] 98 F (36.7 C) (02/13 0736) Pulse Rate:  [86-102] 86 (02/13 0730) Cardiac Rhythm: Normal sinus rhythm (02/13 0400) Resp:  [14-25] 20 (02/13 0730) BP: (91-160)/(53-105) 132/79 (02/13 0600) SpO2:  [96 %-100 %] 98 % (02/13 0730) FiO2 (%):  [28 %] 28 % (02/13 0710) Weight:  [161 lb 2.5 oz (73.1 kg)] 161 lb 2.5 oz (73.1 kg) (02/13 0500)  Hemodynamic parameters for last 24 hours:  stable  Intake/Output from previous day: 02/12 0701 - 02/13 0700 In: 1330 [P.O.:200; NG/GT:880; IV Piggyback:250] Out: 1000 [Urine:1000] Intake/Output this shift: No intake/output data recorded.  No edema  Incisions clean, dry  Lab Results: Recent Labs    08/07/17 0325  WBC 9.6  HGB 12.5  HCT 41.2  PLT 277   BMET:  Recent Labs    08/08/17 0334 08/09/17 0302  NA 145 146*  K 3.4* 3.1*  CL 105 106  CO2 27 28  GLUCOSE 107* 106*  BUN 39* 36*  CREATININE 0.59 0.58  CALCIUM 7.5* 7.4*    PT/INR: No results for input(s): LABPROT, INR in the last 72 hours. ABG    Component Value Date/Time   PHART 7.502 (H) 08/03/2017 0347   HCO3 27.4 08/03/2017 0347   TCO2 28 08/03/2017 0347   ACIDBASEDEF 1.0 07/18/2017 0404   O2SAT 90.0 08/03/2017 0347   CBG (last 3)  Recent Labs    08/08/17 1950 08/08/17 2323 08/09/17 0735  GLUCAP 116* 148* 100*    Assessment/Plan: S/P Procedure(s) (LRB): REMOVAL OF RVAD WITH PUMP STANDBY (N/A) TRANSESOPHAGEAL ECHOCARDIOGRAM (TEE) (N/A) Cont current care Transfer to cir when she can walk   LOS: 30 days    [name] 08/09/2017

## 2017-08-10 ENCOUNTER — Inpatient Hospital Stay (HOSPITAL_COMMUNITY): Payer: Medicare Other

## 2017-08-10 DIAGNOSIS — R0902 Hypoxemia: Secondary | ICD-10-CM

## 2017-08-10 DIAGNOSIS — T17908A Unspecified foreign body in respiratory tract, part unspecified causing other injury, initial encounter: Secondary | ICD-10-CM

## 2017-08-10 LAB — GLUCOSE, CAPILLARY
Glucose-Capillary: 108 mg/dL — ABNORMAL HIGH (ref 65–99)
Glucose-Capillary: 109 mg/dL — ABNORMAL HIGH (ref 65–99)
Glucose-Capillary: 131 mg/dL — ABNORMAL HIGH (ref 65–99)
Glucose-Capillary: 85 mg/dL (ref 65–99)
Glucose-Capillary: 91 mg/dL (ref 65–99)
Glucose-Capillary: 92 mg/dL (ref 65–99)
Glucose-Capillary: 96 mg/dL (ref 65–99)
Glucose-Capillary: 97 mg/dL (ref 65–99)

## 2017-08-10 LAB — BASIC METABOLIC PANEL
Anion gap: 12 (ref 5–15)
BUN: 35 mg/dL — ABNORMAL HIGH (ref 6–20)
CHLORIDE: 102 mmol/L (ref 101–111)
CO2: 31 mmol/L (ref 22–32)
CREATININE: 0.58 mg/dL (ref 0.44–1.00)
Calcium: 7.7 mg/dL — ABNORMAL LOW (ref 8.9–10.3)
GFR calc non Af Amer: >60 mL/min (ref >60)
Glucose, Bld: 105 mg/dL — ABNORMAL HIGH (ref 65–99)
POTASSIUM: 3.4 mmol/L — AB (ref 3.5–5.1)
Sodium: 145 mmol/L (ref 135–145)

## 2017-08-10 MED ORDER — FUROSEMIDE 40 MG PO TABS
40.0000 mg | ORAL_TABLET | Freq: Every day | ORAL | Status: DC
  Administered 2017-08-10 – 2017-08-12 (×3): 40 mg via ORAL
  Filled 2017-08-10 (×3): qty 1

## 2017-08-10 MED ORDER — AMIODARONE HCL 200 MG PO TABS
200.0000 mg | ORAL_TABLET | Freq: Every day | ORAL | Status: DC
  Administered 2017-08-10 – 2017-08-12 (×3): 200 mg via ORAL
  Filled 2017-08-10 (×3): qty 1

## 2017-08-10 MED ORDER — POTASSIUM CHLORIDE 10 MEQ/50ML IV SOLN
10.0000 meq | INTRAVENOUS | Status: AC
Start: 2017-08-10 — End: 2017-08-10
  Administered 2017-08-10 (×2): 10 meq via INTRAVENOUS
  Filled 2017-08-10: qty 50

## 2017-08-10 NOTE — Progress Notes (Signed)
  Speech Language Pathology Treatment: Dysphagia;Passy Muir Speaking valve  Patient Details Name: [name] MRN: [id number] DOB: 12-31-1940 Today's Date: 08/10/2017 Time: 5427-0623 SLP Time Calculation (min) (ACUTE ONLY): 12 min  Assessment / Plan / Recommendation Clinical Impression  Pt observed with cranberry juice (via spoon) thickened slightly less than pudding with immediate cough on 2 of 7 trials (same observation with RN). PMV donned on SLP arrival. Min verbal cues given for smaller bites, rate adequate. Vitals were within normal limits and continues with hoarse vocal quality that pt states is new since admission. Per RN, [name] would like advancement however given MBS done 2/11, observations this session and pt's overall status now becoming stable after weeks of fluctuations, will recommend MBS next week for possible diet/upgrade to liquids. Continue Dys 1, pudding thick liquids. Prognosis for advancement is good.      HPI HPI: Pt admitted 07/10/17 for AVR and ascending AA replacement. Post procedure had VF arrest. 07/11/17 back to OR to evacuate hematoma and place RVAD due to fluid overload and R systolic HF. RVAD subsequently removed. Pt intubated from 07/10/17-07/19/17. Re-intubated 1/24-1/29; trach placed 1/29. PMH: PVD, afib, vfib, hypothyroidism, anxiety and depression, HTN, HLD, R TKA. Pt safe with PMV and alertness has improved consistently therefore appropriate for objective assessment.      SLP Plan  Continue with current plan of care       Recommendations  Diet recommendations: Pudding-thick liquid;Dysphagia 1 (puree) Liquids provided via: Teaspoon Medication Administration: Crushed with puree Supervision: Patient able to self feed;Full supervision/cueing for compensatory strategies Compensations: Minimize environmental distractions;Slow rate;Small sips/bites Postural Changes and/or Swallow Maneuvers: Seated upright 90 degrees      Patient may use Passy-Muir Speech  Valve: During all therapies with supervision         General recommendations: Rehab consult Oral Care Recommendations: Oral care BID Follow up Recommendations: Inpatient Rehab SLP Visit Diagnosis: Dysphagia, oropharyngeal phase (R13.12) Plan: Continue with current plan of care                      [name] 08/10/2017, 11:14 AM  [name].Ed [employer] [phone]

## 2017-08-10 NOTE — Progress Notes (Signed)
Occupational Therapy Treatment Patient Details Name: [name] MRN: [id number] DOB: 1940/09/29 Today's Date: 08/10/2017    History of present illness Pt admitted 07/10/17 for AVR and ascending AA replacement. Post procedure had VF arrest. 07/11/17 back to OR to evacuate hematoma and place RVAD due to fluid overload and R systolic HF. RVAD subsequently removed. Pt intubated from 07/10/17-07/19/17. Re-intubated 1/24-1/29; trach placed 1/29. PMH: PVD, afib, vfib, hypothyroidism, anxiety and depression, HTN, HLD, R TKA.   OT comments  Pt making good progress towards OT goals this session. Pt able to perform grooming tasks with mod vc for sternal precautions and thoroughness. Pt able to perform toilet transfer with 2 person HHA with decreased need for assist and cues for scooting forward and power up. Pt continues to be total A for rear peri care at this time. Pt always very motivated and pleasant during therapy also improved ability to follow commands this session. Continue to recommend CIR level therapies to maximize safety and independence in ADL and functional transfers along with caregiver education and remains an excellent candidate. Next session to focus on LB dressing as Pt really wants to walk in her shoes and not just socks.  Follow Up Recommendations  CIR    Equipment Recommendations  Other (comment)(defer to next venue)    Recommendations for Other Services Rehab consult    Precautions / Restrictions Precautions Precautions: Fall;Sternal Precaution Comments: Reinforced sternal precautions Restrictions Weight Bearing Restrictions: No Other Position/Activity Restrictions: sternal precautions       Mobility Bed Mobility Overal bed mobility: Needs Assistance Bed Mobility: Supine to Sit;Sit to Supine;Rolling Rolling: Min assist;+2 for physical assistance   Supine to sit: Min assist;HOB elevated Sit to supine: Mod assist;+2 for physical assistance   General bed mobility  comments: Cues for hand placement to avoid pulling.  Pt required assistance to lower trunk and lift B LEs into bed.    Transfers Overall transfer level: Needs assistance Equipment used: 4-wheeled walker(EVA walker) Transfers: Sit to/from [facility] Sit to Stand: Max assist;+2 physical assistance Stand pivot transfers: +2 physical assistance;Max assist       General transfer comment: Cues for hand placement on knees to and from seated surface to maintain sternal precautions.  Pt followed commands better.  Pt required cues for hip extension and anterior translation into standing.  Pt walks B feet forward and flexes B hips.  Pt unable to correct with VCs and requires tactile cues to facilitate extension.      Balance Overall balance assessment: Needs assistance Sitting-balance support: Feet supported;Bilateral upper extremity supported Sitting balance-Leahy Scale: Fair Sitting balance - Comments: Pt remains to push posterior when scooting to edge of bed.  Pt required cues for anterior positioning of pelvis to improve sitting posture.   Postural control: Posterior lean   Standing balance-Leahy Scale: Poor Standing balance comment: posterior lean, forward placement of B feet and resistant to facilitation to correct.                             ADL either performed or assessed with clinical judgement   ADL Overall ADL's : Needs assistance/impaired     Grooming: Sitting;Wash/dry hands;Minimal assistance;Set up;Brushing hair;Wash/dry face;Cueing for sequencing Grooming Details (indicate cue type and reason): Pt required vc to brush back of head, cues for sternal precautions (tub terminology used)             Lower Body Dressing: Maximal assistance Lower Body Dressing  Details (indicate cue type and reason): to doff socks Toilet Transfer: Maximal assistance;+2 for physical assistance;+2 for safety/equipment;Squat-pivot(2 HHA) Toilet Transfer Details (indicate  cue type and reason): vc with sit <>stand to push off legs, improved scoot ability this session Toileting- Clothing Manipulation and Hygiene: Total assistance Toileting - Clothing Manipulation Details (indicate cue type and reason): Pt able to stand with +2 A while pericare was completed     Functional mobility during ADLs: Maximal assistance;+2 for physical assistance;Cueing for sequencing;Cueing for safety(EVA walker) General ADL Comments: Pt continues with poor insight into eating restrictions "the MD told me I can eat icecream and drink coke" when this is not the case.     Vision   Vision Assessment?: No apparent visual deficits   Perception     Praxis      Cognition Arousal/Alertness: Awake/alert Behavior During Therapy: Anxious Overall Cognitive Status: Impaired/Different from baseline Area of Impairment: Attention;Memory;Safety/judgement;Awareness;Problem solving                   Current Attention Level: Sustained Memory: Decreased recall of precautions;Decreased short-term memory Following Commands: Follows one step commands consistently Safety/Judgement: Decreased awareness of safety;Decreased awareness of deficits Awareness: Emergent Problem Solving: Slow processing;Difficulty sequencing;Requires tactile cues;Requires verbal cues General Comments: tangential at times; talking about doing         Exercises     Shoulder Instructions       General Comments      Pertinent Vitals/ Pain       Pain Assessment: Faces Faces Pain Scale: Hurts little more Pain Location: generalized discomfort Pain Descriptors / Indicators: Grimacing;Guarding Pain Intervention(s): Monitored during session;Repositioned  Home Living                                          Prior Functioning/Environment              Frequency  Min 3X/week        Progress Toward Goals  OT Goals(current goals can now be found in the care plan section)  Progress  towards OT goals: Progressing toward goals  Acute Rehab OT Goals Patient Stated Goal: to walk OT Goal Formulation: With patient Time For Goal Achievement: 08/17/17 Potential to Achieve Goals: Good  Plan Discharge plan remains appropriate;Frequency remains appropriate    Co-evaluation    PT/OT/SLP Co-Evaluation/Treatment: Yes Reason for Co-Treatment: Necessary to address cognition/behavior during functional activity;For patient/therapist safety;To address functional/ADL transfers PT goals addressed during session: Mobility/safety with mobility OT goals addressed during session: ADL's and self-care      AM-PAC PT "6 Clicks" Daily Activity     Outcome Measure   Help from another person eating meals?: A Lot Help from another person taking care of personal grooming?: A Lot Help from another person toileting, which includes using toliet, bedpan, or urinal?: A Lot Help from another person bathing (including washing, rinsing, drying)?: A Lot Help from another person to put on and taking off regular upper body clothing?: A Lot Help from another person to put on and taking off regular lower body clothing?: Total 6 Click Score: 11    End of Session Equipment Utilized During Treatment: Oxygen  OT Visit Diagnosis: Unsteadiness on feet (R26.81);Muscle weakness (generalized) (M62.81);Other symptoms and signs involving cognitive function   Activity Tolerance Patient tolerated treatment well   Patient Left in bed;with call bell/phone within reach;with bed alarm set;with family/visitor present  Nurse Communication Mobility status        Time: 3419-3790 OT Time Calculation (min): 28 min  Charges: OT General Charges $OT Visit: 1 Visit OT Treatments $Self Care/Home Management : 8-22 mins  Hulda Humphrey OTR/L [place] 08/10/2017, 1:30 PM

## 2017-08-10 NOTE — Progress Notes (Signed)
24 Days Post-Op Procedure(s) (LRB): REMOVAL OF RVAD WITH PUMP STANDBY (N/A) TRANSESOPHAGEAL ECHOCARDIOGRAM (TEE) (N/A) Subjective: NSR stable BP Wt down- decrease lasix to40/day Cap trach trials Ambulate with PT  CIR bed tomorrow is goal Adv diet per RT  Objective: Vital signs in last 24 hours: Temp:  [97.6 F (36.4 C)-98.2 F (36.8 C)] 97.9 F (36.6 C) (02/14 0700) Pulse Rate:  [82-97] 84 (02/14 0700) Cardiac Rhythm: Normal sinus rhythm (02/13 2000) Resp:  [13-26] 18 (02/14 0700) BP: (87-146)/(51-102) 111/61 (02/14 0700) SpO2:  [98 %-100 %] 99 % (02/14 0700) FiO2 (%):  [28 %] 28 % (02/14 0400)  Hemodynamic parameters for last 24 hours:  stable  Intake/Output from previous day: 02/13 0701 - 02/14 0700 In: 1225 [P.O.:225; NG/GT:1000] Out: 500 [Urine:500] Intake/Output this shift: No intake/output data recorded.  Lungs clear Incisions clean, dry Trach in place  Lab Results: No results for input(s): WBC, HGB, HCT, PLT in the last 72 hours. BMET:  Recent Labs    08/09/17 0302 08/10/17 0415  NA 146* 145  K 3.1* 3.4*  CL 106 102  CO2 28 31  GLUCOSE 106* 105*  BUN 36* 35*  CREATININE 0.58 0.58  CALCIUM 7.4* 7.7*    PT/INR: No results for input(s): LABPROT, INR in the last 72 hours. ABG    Component Value Date/Time   PHART 7.502 (H) 08/03/2017 0347   HCO3 27.4 08/03/2017 0347   TCO2 28 08/03/2017 0347   ACIDBASEDEF 1.0 07/18/2017 0404   O2SAT 90.0 08/03/2017 0347   CBG (last 3)  Recent Labs    08/09/17 1607 08/09/17 2357 08/10/17 0334  GLUCAP 117* 97 91    Assessment/Plan: S/P Procedure(s) (LRB): REMOVAL OF RVAD WITH PUMP STANDBY (N/A) TRANSESOPHAGEAL ECHOCARDIOGRAM (TEE) (N/A) Cont current care   LOS: 31 days    [name] 08/10/2017

## 2017-08-10 NOTE — Progress Notes (Signed)
..  Name: [name] MRN: [id number] DOB: [birth year]    ADMISSION DATE:  07/10/2017  CONSULTATION DATE:  07/12/17  REFERRING MD :  Terri Pyle MD   BRIEF:   77 year old female with past medical history significant for fusiform ascending aortic aneurysm, hypertension, hyperlipidemia, status post AVR and replacement of ascending aortic aneurysm.  On 07/10/2017 postprocedure patient developed acute cardiogenic shock had an episode of V. fib arrest was taken back to the OR on 07/11/2017 for evacuation of hematoma and placement of RVAD. She had prolonged mechanical ventilation requiring tracheostomy.   SUBJECTIVE:  Trach capped yesterday 2/13 but did not tolerate.  Placed back on ATC at 28%.  VITAL SIGNS: Temp:  [97.6 F (36.4 C)-98.2 F (36.8 C)] 97.9 F (36.6 C) (02/14 0700) Pulse Rate:  [82-97] 84 (02/14 0700) Resp:  [13-26] 18 (02/14 0700) BP: (87-146)/(51-102) 111/61 (02/14 0700) SpO2:  [98 %-100 %] 99 % (02/14 0700) FiO2 (%):  [28 %] 28 % (02/14 0400)   Filed Weights   08/07/17 0404 08/08/17 0500 08/09/17 0500  Weight: 75.5 kg (166 lb 7.2 oz) 75.6 kg (166 lb 10.7 oz) 73.1 kg (161 lb 2.5 oz)    Intake/Output Summary (Last 24 hours) at 08/10/2017 0748 Last data filed at 08/10/2017 0700 Gross per 24 hour  Intake 1225 ml  Output 500 ml  Net 725 ml   PHYSICAL EXAMINATION: General: Chronically ill appearing female, NAD HEENT: Trach in place on ATC with PMV at 28% FiO2, [place]/AT, PERRL, EOM-I and MMM Neuro: Alert and interactive, moving all ext to command CV: RRR, no M/R/G PULM: CTA bilaterally GI: Soft, NT, ND and +BS Extremities: Bilateral edema improving Skin: No rash  PULMONARY  CBC Recent Labs  Lab 08/05/17 0405 08/07/17 0325  HGB 12.0 12.5  HCT 40.3 41.2  WBC 11.0* 9.6  PLT 324 277   COAGULATION No results for input(s): INR in the last 168 hours.  CARDIAC  No results for input(s): TROPONINI in the last 168 hours. No results for input(s): PROBNP in the last  168 hours.  CHEMISTRY Recent Labs  Lab 08/06/17 0223 08/07/17 0325 08/08/17 0334 08/09/17 0302 08/10/17 0415  NA 143 145 145 146* 145  K 3.2* 3.4* 3.4* 3.1* 3.4*  CL 104 105 105 106 102  CO2 26 27 27 28 31   GLUCOSE 120* 114* 107* 106* 105*  BUN 41* 41* 39* 36* 35*  CREATININE 0.66 0.66 0.59 0.58 0.58  CALCIUM 7.5* 7.5* 7.5* 7.4* 7.7*  MG  --   --   --  1.9  --   PHOS  --   --   --  3.1  --    Estimated Creatinine Clearance: 60.8 mL/min (by C-G formula based on SCr of 0.58 mg/dL).  LIVER No results for input(s): AST, ALT, ALKPHOS, BILITOT, PROT, ALBUMIN, INR in the last 168 hours.  INFECTIOUS No results for input(s): LATICACIDVEN, PROCALCITON in the last 168 hours.  ENDOCRINE CBG (last 3)  Recent Labs    08/09/17 1607 08/09/17 2357 08/10/17 0334  GLUCAP 117* 97 91   IMAGING  No results found. STUDIES: Echo12/12/2016 LVEF 55-60%, Grade 1 DD, Mild/Mod AI with LVOT, Mid ascending aortic diameter 48.88 mm, Mild LAE, RV normal Echo (07/22/17): EF 65-70%, bioprosthetic aortic valve functioning normally, PASP 33 mmHg, RV looks ok.   CULTURES 1/24 sputum >> oral flora  ANTIBIOTICS Ceftaz1/17 >> 1.20 Cefuroxime 1/21 >> 1/23 Ceftaz 1/23 >> 1/31  EVENTS 1/14  Admit 1/14 S/p AVR and ascending  AA replacement  1/14 VF Arrest in evening with shock x 1 1/15 to OR for evacuation of hematoma and RVAD placement  1/16 PCCM consult 1/19 Lasix per cardiology,  CVP of 5 1/21 RVAD removed 1/22 heavily diuresed  1/23 ready for extubation. Atrial fib amio started.  1/24 reintubated 1/25  Afebrile 1/29  trach placed 2/01  Tolerating PMV with SLP  2/12 tolerated downsize to 4 cuffless   ASSESSMENT / PLAN: 77 year old female with PMHx significant for fusiform ascending aortic aneurysm, hypertension, hyperlipidemia, status post AVR and replacement of ascending aortic aneurysm.  On 07/10/2017 postprocedure patient developed acute cardiogenic shock had an episode of V. fib arrest  was taken back to the OR on 07/11/2017 for evacuation of hematoma and placement of RVAD.  RVAD now removed, failed extubation 1/24 but continued to wean well. Underwent tracheostomy 1/29.   Tolerated downsize to 4 cuffless trach 2/12.  2/13 attempted trach cap but did not tolerate, placed back on 28% FiO2.  Tolerating PMV well.  ASSESSMENT / PLAN:  Chronic hypoxic respiratory failure - s/p trach placement 1/29.  Downsized to 4 cuffless 2/12 but did not tolerate capping trial 2/13. - Continue PMV - Re-attempt capping trial today, if tolerates then can consider decannulation this weekend - Bronchial hygiene - CXR intermittently  Dysphagia. - Per speech  Rest per primary team.   PCCM will follow for trach management.   [name], [facility] Pager: [phone]  or [phone] 08/10/2017, 7:55 AM  Attending Note:  77 year old female s/p extensive cardiac surgery that is now much improved.  Patient was unable to tolerate capping yesterday.  On exam, I stayed bedside and removed the 4 cuffless tracheostomy myself and patient started complaining of inability to inhale properly and started having stridor like sounds.  I reviewed CXR myself, trach is in good position and no further acute disease.  Discussed with Dr. Nils Harmon.  Airway obstruction:   - ENT consult  - Keep trach in place  Respiratory failure:  - Monitor for airway protection  Hypoxemia:  - Titrate O2 for sat of 88-92%  Trach status:  - Keep cuffless 4 in place  - Will call ENT to evaluate  PCCM will continue to follow for airway management.  Patient seen and examined, agree with above note.  I dictated the care and orders written for this patient under my direction.  [name], [place]

## 2017-08-10 NOTE — Progress Notes (Signed)
RT note-Notified RN concerning patient feeling like she can not cough any phlem up. Also concerns with excessive coughing while eating magic ice cream.

## 2017-08-10 NOTE — Progress Notes (Signed)
Patient ID: [name], female   DOB: [birth year], 77 y.o.   MRN: [id number] [facility] Evening Rounds:  Hemodynamically stable  sats 100% on trach collar  Urine output good.  Plan is for CIR tomorrow.

## 2017-08-10 NOTE — Discharge Summary (Signed)
Physician Discharge Summary  Patient ID: [name] MRN: [id number] DOB/AGE: 12-18-1940 77 y.o.  Admit date: 07/10/2017 Discharge date: 08/12/2017  Admission Diagnoses: Ascending aortic aneurysm and moderate aortic valve insufficiency  Discharge Diagnoses:  Active Problems:   S/P AVR   RVAD (right ventricular assist device) present (HCC)   Cardiogenic shock (HCC)   RVF (right ventricular failure) ([place])   Acute respiratory failure ([place])   HCAP (healthcare-associated pneumonia)   Acute encephalopathy   Tracheostomy in place ([place])   Pressure injury of skin   PAF (paroxysmal atrial fibrillation) [facility])   Patient Active Problem List   Diagnosis Date Noted  . PAF (paroxysmal atrial fibrillation) ([place])   . Pressure injury of skin 08/04/2017  . Tracheostomy in place [facility])   . Acute encephalopathy   . HCAP (healthcare-associated pneumonia)   . Acute respiratory failure ([place])   . RVF (right ventricular failure) ([place])   . RVAD (right ventricular assist device) present ([place])   . Cardiogenic shock ([place])   . S/P AVR 07/10/2017  . Nephrolithiasis 04/07/2017  . Vitamin B 12 deficiency 03/01/2016  . Ascending aortic aneurysm ([place]) 08/20/2015  . Thyroid cancer ([place]) 07/30/2015  . Left thyroid nodule 07/27/2015  . Osteoporosis with fracture 04/16/2014  . At high risk for falls 04/16/2014  . Proximal humerus fracture 05/30/2011  . CHEST PAIN, PRECORDIAL 02/26/2010  . Hyperlipidemia 02/23/2010  . Depression 02/23/2010  . GLAUCOMA 02/23/2010  . CATARACTS 02/23/2010  . RHEUMATIC FEVER 02/23/2010  . Essential hypertension 02/23/2010  . MITRAL VALVE PROLAPSE 02/23/2010  . GERD 02/23/2010  . Osteoarthritis 02/23/2010  . Primary fibromyalgia syndrome 02/23/2010    CLINICAL NOTE:   At time of consultation The patient is a 77 year old female who has been followed for a fusiform ascending aneurysm which has increased in size over the past 1-2 years.  She had associated moderate  aortic insufficiency.  LV function was well preserved.  She was recommended for aortic valve replacement with combined repair of the large ascending aneurysm because of the risk of dissection.  The procedure, indications, benefits, alternatives, and risks were discussed with the patient in detail.  She understood the location of the surgical incision and the use of general anesthesia and cardiopulmonary bypass.  She understood the circulatory arrest was planned  with antegrade cerebral perfusion and careful cerebral oximeter monitoring of brain perfusion.  She understood the risks of the surgery to include stroke, bleeding, blood transfusion requirement, organ failure, postoperative infection, postoperative pulmonary problems, and death.  She agreed to proceed under what I felt was an informed consent.  Discharged Condition: stable  Hospital Course: The patient is a 77 year old female who was admitted electively for the below described procedure which included aortic valve replacement and replacement of the a sending aorta by Dr. Darcey Nora on 07/10/2017.  She tolerated the procedure well but did have early difficulties in the evening post surgery she developed significant acidosis as well as low urine output and increasing need for inotropic support to maintain blood pressure.  Concern was made that the patient developed tamponade and VF arrest and subsequently required reexploration.  At that time and RVAD was placed as well.  Significant hematoma was evacuated at that time.  Following the procedure advanced heart failure team was brought in to assist with postoperative management.  Patient required ventilatory management as well as significant inotropic support.  Due to the long-term need for ventilator support critical care medicine was also brought in postoperative management.  The patient  required a very aggressive diuresis.  On 07/17/2017 the patient was able to be returned to the operating room  for removal of the RV device.  She tolerated the procedure well and was taken back to the SICU in stable condition.  The right ventricle did show signs of recovery the patient has some tricuspid regurgitation which is being managed medically.  She was subsequently weaned from the ventilator but did not tolerate this well.  It was determined that she would require reintubation and placement of tracheostomy.  A feeding tube was also placed to assist with nutrition management.  She also developed HCAP and was treated with a course of intravenous ceftazidime.  She developed acute encephalopathy which has shown a gradual improvement over time.  She developed anemia of critical illness which is stabilized.She has been transfused in the post-operative period.  She developed thrombocytopenia which has subsequently resolved.  She developed postoperative atrial fibrillation but was subsequently chemically cardioverted to sinus rhythm with amiodarone.  Additionally, postoperatively she developed acute kidney injury which is subsequently resolved.  She was slowly weaned from the ventilator over time the tracheostomy has been downsized.  Currently she is not completely tolerating the trach being capped and is on ATC at 28%.  She has a cuff list for trach in place.  Speech therapy has been assisting with diet management and swallowing and current recommendations are for pudding thick liquid, dysphasia 1 pured liquids provided via teaspoon.  Medication administration is crushed with pure.  Plan is for transfer to CIR in the near future for further rehabilitation.  Consults: pulmonary/intensive care, AHF-cardiology  Significant Diagnostic Studies:  ECHO: Result status: Final result                              *[facility]*                         [address], [place] [address]                             [phone]  ------------------------------------------------------------------- Transthoracic Echocardiography  Patient:    Terri, Harmon MR #:       [id number] Study Date: 07/22/2017 Gender:     F Age:        71 Height:     165.1 cm Weight:     88.9 kg BSA:        2.05 m^2 Pt. Status: Room:       [facility]   ADMITTING    Dahlia Byes  ATTENDING    VanTrigt, Marice Potter, M.D.  REFERRING    Loralie Champagne, M.D.  PERFORMING   [facility], Inpatient  SONOGRAPHER  Cardell Peach, RDCS  cc:  ------------------------------------------------------------------- LV EF: 65% -   70%  ------------------------------------------------------------------- Indications:      CHF - 428.0.  ------------------------------------------------------------------- History:   PMH:   Aortic valve disease.  Mitral valve prolapse. Risk factors:  Hypertension. Dyslipidemia.  ------------------------------------------------------------------- Study Conclusions  - Left ventricle: The cavity size was normal. There was severe   concentric hypertrophy.  Systolic function was vigorous. The   estimated ejection fraction was in the range of 65% to 70%. Wall   motion was normal; there were no regional wall motion   abnormalities. Left ventricular diastolic function parameters   were normal. - Aortic valve: A bioprosthesis was present and functioning   normally. There was mild stenosis. Valve area (VTI): 0.77 cm^2.   Valve area (Vmax): 0.75 cm^2. Valve area (Vmean): 0.76 cm^2. - Aorta: S/P ascending aorta replacement - normal ascendig aortic   size. Aortic root dimension: 28 mm (ED). - Aortic root: The aortic root was normal in size. - Right atrium: The atrium was severely dilated. - Tricuspid valve: There was moderate regurgitation. - Pulmonary arteries: PA peak pressure: 38 mm Hg (S).  Impressions:  - The right ventricular systolic pressure was increased consistent   with  mild pulmonary hypertension.  ------------------------------------------------------------------- Study data:  Comparison was made to the study of 07/17/2017.  Study status:  Routine.  Procedure:  Transthoracic echocardiography. Image quality was fair. The study was technically difficult, as a result of restricted patient mobility.  Study completion:  There were no complications.          Transthoracic echocardiography. M-mode, complete 2D, spectral Doppler, and color Doppler. Birthdate:  Patient birthdate: January 31, 1941.  Age:  Patient is 77 yr old.  Sex:  Gender: female.    BMI: 32.6 kg/m^2.  Blood pressure:   117/56  Patient status:  Inpatient.  Study date:  Study date: 07/22/2017. Study time: 10:04 AM.  Location:  ICU/CCU  -------------------------------------------------------------------  ------------------------------------------------------------------- Left ventricle:  The cavity size was normal. There was severe concentric hypertrophy. Systolic function was vigorous. The estimated ejection fraction was in the range of 65% to 70%. Wall motion was normal; there were no regional wall motion abnormalities. The transmitral flow pattern was normal. The deceleration time of the early transmitral flow velocity was normal. The pulmonary vein flow pattern was normal. The tissue Doppler parameters were normal. Left ventricular diastolic function parameters were normal.  ------------------------------------------------------------------- Aortic valve:  A bioprosthesis was present and functioning normally. Mobility was not restricted.  Doppler:   There was mild stenosis.   There was no regurgitation.    VTI ratio of LVOT to aortic valve: 0.5. Valve area (VTI): 0.77 cm^2. Indexed valve area (VTI): 0.38 cm^2/m^2. Peak velocity ratio of LVOT to aortic valve: 0.49. Valve area (Vmax): 0.75 cm^2. Indexed valve area (Vmax): 0.37 cm^2/m^2. Mean velocity ratio of LVOT to aortic valve: 0.49.  Valve area (Vmean): 0.76 cm^2. Indexed valve area (Vmean): 0.37 cm^2/m^2.    Mean gradient (S): 15 mm Hg. Peak gradient (S): 31 mm Hg.  ------------------------------------------------------------------- Aorta:  S/P ascending aorta replacement - normal ascendig aortic size. Aortic root: The aortic root was normal in size.  ------------------------------------------------------------------- Mitral valve:   Structurally normal valve.   Mobility was not restricted.  Doppler:  Transvalvular velocity was within the normal range. There was no evidence for stenosis. There was no regurgitation.    Valve area by pressure half-time: 4.15 cm^2. Indexed valve area by pressure half-time: 2.02 cm^2/m^2.    Peak gradient (D): 2 mm Hg.  ------------------------------------------------------------------- Left atrium:  The atrium was normal in size.  ------------------------------------------------------------------- Right ventricle:  The cavity size was normal. Wall thickness was normal. Systolic function was normal.  ------------------------------------------------------------------- Pulmonic valve:    Structurally normal valve.   Cusp separation was normal.  Doppler:  Transvalvular velocity was within the normal range. There was no evidence for stenosis. There was  no regurgitation.  ------------------------------------------------------------------- Tricuspid valve:   Structurally normal valve.    Doppler: Transvalvular velocity was within the normal range. There was moderate regurgitation.  ------------------------------------------------------------------- Pulmonary artery:   The main pulmonary artery was normal-sized. Systolic pressure was within the normal range.  ------------------------------------------------------------------- Right atrium:  The atrium was severely dilated.  ------------------------------------------------------------------- Pericardium:  There was no  pericardial effusion.  ------------------------------------------------------------------- Systemic veins: Inferior vena cava: The vessel was dilated. The respirophasic diameter changes were blunted (< 50%), consistent with elevated central venous pressure.  ------------------------------------------------------------------- Post procedure conclusions Ascending Aorta:  - S/P ascending aorta replacement - normal ascendig aortic size.  ------------------------------------------------------------------- Measurements   Left ventricle                           Value          Reference  LV ID, ED, PLAX chordal          (L)     37    mm       43 - 52  LV ID, ES, PLAX chordal          (L)     19    mm       23 - 38  LV fx shortening, PLAX chordal           49    %        >=29  LV PW thickness, ED                      20    mm       ----------  IVS/LV PW ratio, ED                      0.75           <=1.3  Stroke volume, 2D                        30    ml       ----------  Stroke volume/bsa, 2D                    15    ml/m^2   ----------  LV e&', lateral                           12.2  cm/s     ----------  LV E/e&', lateral                         6.11           ----------  LV e&', medial                            9.68  cm/s     ----------  LV E/e&', medial                          7.71           ----------  LV e&', average                           10.94 cm/s     ----------  LV E/e&', average  6.82           ----------    Ventricular septum                       Value          Reference  IVS thickness, ED                        15    mm       ----------    LVOT                                     Value          Reference  LVOT ID, S                               14    mm       ----------  LVOT area                                1.54  cm^2     ----------  LVOT peak velocity, S                    136   cm/s     ----------  LVOT mean velocity, S                     86.4  cm/s     ----------  LVOT VTI, S                              19.6  cm       ----------  LVOT peak gradient, S                    7     mm Hg    ----------    Aortic valve                             Value          Reference  Aortic valve peak velocity, S            280   cm/s     ----------  Aortic valve mean velocity, S            175   cm/s     ----------  Aortic valve VTI, S                      39.3  cm       ----------  Aortic mean gradient, S                  15    mm Hg    ----------  Aortic peak gradient, S                  31    mm Hg    ----------  VTI ratio, LVOT/AV                       0.5            ----------  Aortic valve area, VTI                   0.77  cm^2     ----------  Aortic valve area/bsa, VTI               0.38  cm^2/m^2 ----------  Velocity ratio, peak, LVOT/AV            0.49           ----------  Aortic valve area, peak velocity         0.75  cm^2     ----------  Aortic valve area/bsa, peak              0.37  cm^2/m^2 ----------  velocity  Velocity ratio, mean, LVOT/AV            0.49           ----------  Aortic valve area, mean velocity         0.76  cm^2     ----------  Aortic valve area/bsa, mean              0.37  cm^2/m^2 ----------  velocity    Aorta                                    Value          Reference  Aortic root ID, ED                       28    mm       ----------    Left atrium                              Value          Reference  LA ID, A-P, ES                           39    mm       ----------  LA ID/bsa, A-P                           1.9   cm/m^2   <=2.2  LA volume, ES, 1-p A4C                   48    ml       ----------  LA volume/bsa, ES, 1-p A4C               23.4  ml/m^2   ----------    Mitral valve                             Value          Reference  Mitral E-wave peak velocity              74.6  cm/s     ----------  Mitral A-wave peak velocity              96.8  cm/s     ----------  Mitral deceleration time                  180   ms  150 - 230  Mitral pressure half-time                53    ms       ----------  Mitral peak gradient, D                  2     mm Hg    ----------  Mitral E/A ratio, peak                   0.8            ----------  Mitral valve area, PHT, DP               4.15  cm^2     ----------  Mitral valve area/bsa, PHT, DP           2.02  cm^2/m^2 ----------    Pulmonary arteries                       Value          Reference  PA pressure, S, DP               (H)     38    mm Hg    <=30    Tricuspid valve                          Value          Reference  Tricuspid regurg peak velocity           241   cm/s     ----------  Tricuspid peak RV-RA gradient            23    mm Hg    ----------    Right atrium                             Value          Reference  RA ID, S-I, ES, A4C              (H)     53    mm       34 - 49  RA area, ES, A4C                         18.2  cm^2     8.3 - 19.5  RA volume, ES, A/L                       52.2  ml       ----------  RA volume/bsa, ES, A/L                   25.4  ml/m^2   ----------    Systemic veins                           Value          Reference  Estimated CVP                            15    mm Hg    ----------    Right ventricle  Value          Reference  RV ID, minor axis, ED, A4C base          38    mm       ----------  TAPSE                                    10.2  mm       ----------  RV pressure, S, DP               (H)     38    mm Hg    <=30  RV s&', lateral, S                        6.71  cm/s     ----------  Legend: (L)  and  (H)  mark values outside specified reference range.  ------------------------------------------------------------------- Prepared and Electronically Authenticated by  Fransico Him, MD 2019-01-26T14:15:04     Treatments: surgery: 07/25/2017                      Percutaneous Tracheostomy Placement  Consent from family.  Patient sedated, paralyzed and  position.  Placed on 100% FiO2 and RR matched.  Area cleaned and draped.  Lidocaine/epi injected.  Skin incision done followed by blunt dissection.  Trachea palpated then punctured, catheter passed and visualized bronchoscopically.  Wire placed and visualized.  Catheter removed.  Airway then crushed and dilated.  Size 6 cuffed shiley trach placed and visualized bronchoscopically well above carina.  Good volume returns.  Patient tolerated the procedure well without complications.  Minimal blood loss.  CXR ordered and pending.  Rush Farmer, M.D. [facility] Pulmonary/Critical Care Medicine. Pager: [phone]. After hours pager: [phone].           07/17/2017  11:33 AM  PATIENT:  [name]  77 y.o. female  PRE-OPERATIVE DIAGNOSIS:  RV FAILURE  POST-OPERATIVE DIAGNOSIS:  RV FAILURE  PROCEDURE:  Procedure(s): REMOVAL OF RVAD WITH PUMP STANDBY (N/A) TRANSESOPHAGEAL ECHOCARDIOGRAM (TEE) (N/A)  Chest Closure  SURGEON:  Surgeon(s) and Role:    Ivin Poot, MD - Primary  PHYSICIAN ASSISTANT: 0  ASSISTANTS: TOW RNFA   ANESTHESIA:   general   DATE OF PROCEDURE:  07/11/2017 DATE OF DISCHARGE:                              OPERATIVE REPORT   OPERATIONS: 1. Mediastinal exploration after resection and grafting of ascending     aneurysm with aortic valve replacement. 2. Implantation of percutaneous right ventricular assist device with     CentriMag blood pump and cannulas (7-French arterial cannula to the     main pulmonary artery and a 23/25 venous drainage cannula placed     from the right femoral vein to the right atrium).  SURGEON:  Ivin Poot, MD.  ASSISTANT:  John Giovanni, PA-C.  ANESTHESIA:  General.  07/10/2017  12:33 PM  PATIENT:  [name]  77 y.o. female  PRE-OPERATIVE DIAGNOSIS:  TAA AI  POST-OPERATIVE DIAGNOSIS:  TAA AI  PROCEDURE:  Procedure(s): AORTIC VALVE REPLACEMENT (AVR) (N/A) REPLACEMENT ASCENDING AORTA  (N/A) TRANSESOPHAGEAL ECHOCARDIOGRAM (TEE) (N/A)  SURGEON:  Surgeon(s) and Role:    Ivin Poot, MD - Primary  PHYSICIAN ASSISTANT:  Arad Burston PA-C  ANESTHESIA:   general      Discharge Exam: Blood pressure 131/69, pulse 89, temperature 98.2 F (36.8 C), temperature source Oral, resp. rate 19, height 5\' 5"  (1.651 m), weight 162 lb 11.2 oz (73.8 kg), SpO2 97 %.   Lungs clear Incisions clean, dry Trach in place   Disposition: 01-Home or Self Care  Discharge Instructions    Discharge patient   Complete by:  As directed    Inpatient CIR   Discharge disposition:  [place] Not Defined   Discharge patient date:  08/12/2017   Discharge patient   Complete by:  As directed    Inpatient CIR   Discharge disposition:  [place] Not Defined   Discharge patient date:  08/12/2017      Scheduled Meds: . amiodarone  200 mg Oral Daily  . arformoterol  15 mcg Nebulization BID  . aspirin EC  325 mg Oral Daily   Or  . aspirin  324 mg Per Tube Daily  . budesonide (PULMICORT) nebulizer solution  0.5 mg Nebulization BID  . chlorhexidine  15 mL Mouth Rinse BID  . clonazepam  1 mg Oral QHS  . enoxaparin (LOVENOX) injection  40 mg Subcutaneous Q24H  . feeding supplement (PRO-STAT SUGAR FREE 64)  30 mL Per Tube TID  . furosemide  40 mg Oral Daily  . Gerhardt's butt cream   Topical BID  . insulin aspart  0-24 Units Subcutaneous Q4H  . insulin detemir  10 Units Subcutaneous BID  . latanoprost  1 drop Both Eyes QHS  . levalbuterol  0.63 mg Nebulization BID  . levothyroxine  100 mcg Oral QAC breakfast  . mouth rinse  15 mL Mouth Rinse q12n4p  . naphazoline-glycerin  1-2 drop Both Eyes BID  . pantoprazole sodium  40 mg Per Tube BID  . QUEtiapine  75 mg Oral QHS  . sennosides  5 mL Oral BID  . sodium chloride flush  10-40 mL Intracatheter Q12H  . spironolactone  25 mg Oral Daily  . venlafaxine  37.5 mg Oral BID   Continuous  Infusions: . sodium chloride 250 mL (07/22/17 1820)  . feeding supplement (VITAL 1.5 CAL) 1,000 mL (08/10/17 1900)  . lactated ringers Stopped (07/17/17 1200)   PRN Meds:.acetaminophen (TYLENOL) oral liquid 160 mg/5 mL, levalbuterol, metoprolol tartrate, ondansetron (ZOFRAN) IV, RESOURCE THICKENUP CLEAR    Allergies as of 08/12/2017      Reactions   Alendronate Sodium Nausea Only   Levofloxacin Nausea And Vomiting   Lipitor [atorvastatin Calcium] Other (See Comments)   myalgias   Lyrica [pregabalin] Other (See Comments)   SORES IN MOUTH    Meloxicam Other (See Comments)   Vaginal itching    Sibutramine Hcl Monohydrate Other (See Comments)   Reaction unknown   Actonel [risedronate] Other (See Comments)   Bone pain and nausea   Latex Rash   RA to latex 1982; includes bandaids       Medication List    STOP taking these medications   aspirin 81 MG tablet   atenolol 50 MG tablet Commonly known as:  TENORMIN   celecoxib 200 MG capsule Commonly known as:  CELEBREX   clobetasol cream 0.05 % Commonly known as:  TEMOVATE   clorazepate 7.5 MG tablet Commonly known as:  TRANXENE   docusate sodium 100 MG capsule Commonly known as:  COLACE   famotidine 20 MG tablet Commonly known as:  PEPCID   fluticasone 50  MCG/ACT nasal spray Commonly known as:  FLONASE   gabapentin 100 MG capsule Commonly known as:  NEURONTIN   GAS-X PO   mupirocin ointment 2 % Commonly known as:  BACTROBAN   Omega 3 1200 MG Caps   polyethylene glycol packet Commonly known as:  MIRALAX / GLYCOLAX   PROCTOZONE-HC 2.5 % rectal cream Generic drug:  hydrocortisone   simvastatin 80 MG tablet Commonly known as:  ZOCOR   traMADol 50 MG tablet Commonly known as:  ULTRAM   TRAVATAN Z 0.004 % Soln ophthalmic solution Generic drug:  Travoprost (BAK Free)   triamcinolone cream 0.1 % Commonly known as:  KENALOG   venlafaxine XR 75 MG 24 hr capsule Commonly known as:  EFFEXOR-XR Replaced by:   venlafaxine 37.5 MG tablet   zolpidem 10 MG tablet Commonly known as:  AMBIEN     TAKE these medications   acetaminophen 160 MG/5ML solution Commonly known as:  TYLENOL Place 20.3 mLs (650 mg total) into feeding tube every 6 (six) hours as needed for mild pain.   amiodarone 200 MG tablet Commonly known as:  PACERONE Take 1 tablet (200 mg total) by mouth daily. Start taking on:  08/13/2017   arformoterol 15 MCG/2ML Nebu Commonly known as:  BROVANA Take 2 mLs (15 mcg total) by nebulization 2 (two) times daily.   budesonide 0.5 MG/2ML nebulizer solution Commonly known as:  PULMICORT Take 2 mLs (0.5 mg total) by nebulization 2 (two) times daily.   chlorhexidine 0.12 % solution Commonly known as:  PERIDEX 15 mLs by Mouth Rinse route 2 (two) times daily.   feeding supplement (PRO-STAT SUGAR FREE 64) Liqd Place 30 mLs into feeding tube 3 (three) times daily.   feeding supplement (VITAL 1.5 CAL) Liqd Place 1,000 mLs into feeding tube continuous.   furosemide 40 MG tablet Commonly known as:  LASIX Take 1 tablet (40 mg total) by mouth daily. Start taking on:  08/13/2017   insulin aspart 100 UNIT/ML injection Commonly known as:  novoLOG Inject 0-24 Units into the skin every 4 (four) hours.   insulin detemir 100 UNIT/ML injection Commonly known as:  LEVEMIR Inject 0.1 mLs (10 Units total) into the skin 2 (two) times daily.   latanoprost 0.005 % ophthalmic solution Commonly known as:  XALATAN Place 1 drop into both eyes at bedtime.   levalbuterol 0.63 MG/3ML nebulizer solution Commonly known as:  XOPENEX Take 3 mLs (0.63 mg total) by nebulization 2 (two) times daily.   levalbuterol 0.63 MG/3ML nebulizer solution Commonly known as:  XOPENEX Take 3 mLs (0.63 mg total) by nebulization every 6 (six) hours as needed for wheezing or shortness of breath.   levothyroxine 100 MCG tablet Commonly known as:  SYNTHROID, LEVOTHROID Take 1 tablet (100 mcg total) by mouth daily before  breakfast. Start taking on:  08/13/2017 What changed:    medication strength  how much to take  additional instructions  Another medication with the same name was removed. Continue taking this medication, and follow the directions you see here.   mouth rinse Liqd solution 15 mLs by Mouth Rinse route 2 times daily at 12 noon and 4 pm.   naphazoline-glycerin 0.012-0.2 % Soln Commonly known as:  CLEAR EYES REDNESS Place 1-2 drops into both eyes 2 (two) times daily at 10 AM and 5 PM.   pantoprazole sodium 40 mg/20 mL Pack Commonly known as:  PROTONIX Place 20 mLs (40 mg total) into feeding tube 2 (two) times daily.   QUEtiapine 25  MG tablet Commonly known as:  SEROQUEL Take 3 tablets (75 mg total) by mouth at bedtime.   RESOURCE THICKENUP CLEAR Powd As directed by speech therapy   sennosides 8.8 MG/5ML syrup Commonly known as:  SENOKOT Take 5 mLs by mouth 2 (two) times daily.   spironolactone 25 MG tablet Commonly known as:  ALDACTONE Take 1 tablet (25 mg total) by mouth daily. Start taking on:  08/13/2017   venlafaxine 37.5 MG tablet Commonly known as:  EFFEXOR Take 1 tablet (37.5 mg total) by mouth 2 (two) times daily. Replaces:  venlafaxine XR 75 MG 24 hr capsule       Signed: John Giovanni 08/12/2017, 2:49 PM

## 2017-08-10 NOTE — Procedures (Signed)
Central Venous Trialysis Catheter Insertion Procedure Note [name] [id number] Aug 21, 1940  Procedure: Insertion of Central Venous Catheter Indications: Dialysis access  Procedure Details Consent: Risks of procedure as well as the alternatives and risks of each were explained to the (patient/caregiver).  Consent for procedure obtained. Time Out: Verified patient identification, verified procedure, site/side was marked, verified correct patient position, special equipment/implants available, medications/allergies/relevent history reviewed, required imaging and test results available.  Performed  Maximum sterile technique was used including antiseptics, cap, gloves, gown, hand hygiene, mask and sheet. Skin prep: Chlorhexidine; local anesthetic administered A antimicrobial bonded/coated triple lumen catheter was placed in the right internal jugular vein using the Seldinger technique.  Evaluation Blood flow good Complications: No apparent complications Patient did tolerate procedure well. Chest X-ray ordered to verify placement.  CXR: pending.  YACOUB,WESAM 08/10/2017, 3:05 PM

## 2017-08-10 NOTE — Progress Notes (Signed)
Physical Therapy Treatment Patient Details Name: [name] MRN: [id number] DOB: 02-24-1941 Today's Date: 08/10/2017    History of Present Illness Pt admitted 07/10/17 for AVR and ascending AA replacement. Post procedure had VF arrest. 07/11/17 back to OR to evacuate hematoma and place RVAD due to fluid overload and R systolic HF. RVAD subsequently removed. Pt intubated from 07/10/17-07/19/17. Re-intubated 1/24-1/29; trach placed 1/29. PMH: PVD, afib, vfib, hypothyroidism, anxiety and depression, HTN, HLD, R TKA.    PT Comments    Pt performed increased activity performing transfer from recliner to [facility], BSC to standing and recliner back to bed.  Pt remains to require max assistance +2 due to posterior push in standing.  Pt motivated and following commands with improved competency.  Plan for CIR remains appropriate as she is an excellent candidate for aggressive therapies to return to baseline.      Follow Up Recommendations  CIR     Equipment Recommendations  (TBD at next venue)    Recommendations for Other Services Rehab consult     Precautions / Restrictions Precautions Precautions: Fall;Sternal Precaution Comments: Reinforced sternal precautions Restrictions Weight Bearing Restrictions: No Other Position/Activity Restrictions: sternal precautions    Mobility  Bed Mobility Overal bed mobility: Needs Assistance Bed Mobility: Supine to Sit Rolling: Min assist;+2 for physical assistance     Sit to supine: Mod assist;+2 for physical assistance   General bed mobility comments: Cues for hand placement to avoid pulling.  Pt required assistance to lower trunk and lift B LEs into bed.    Transfers Overall transfer level: Needs assistance Equipment used: 4-wheeled walker(EVA walker) Transfers: Sit to/from Stand Sit to Stand: Max assist;+2 physical assistance Stand pivot transfers: +2 physical assistance;Max assist       General transfer comment: Cues for hand placement on  knees to and from seated surface to maintain sternal precautions.  Pt followed commands better.  Pt required cues for hip extension and anterior translation into standing.  Pt walks B feet forward and flexes B hips.  Pt unable to correct with VCs and requires tactile cues to facilitate extension.    Ambulation/Gait Ambulation/Gait assistance: Max assist;+2 physical assistance Ambulation Distance (Feet): 34 Feet Assistive device: 4-wheeled walker(EVA) Gait Pattern/deviations: Shuffle;Trunk flexed;Decreased stride length;Leaning posteriorly;Narrow base of support;Scissoring Gait velocity: Decreased   General Gait Details: Unsteady ambulation using Eva walker and maxA+2 to maintain balance and trunk elevation, and control walker. Pt with significant posterior lean, with feet too far forward and increased difficulty translating her trunk forward despite multimodal cues. Pt presents with scissoring narrow BOS and cues to increase BOS.     Stairs            Wheelchair Mobility    Modified Rankin (Stroke Patients Only)       Balance Overall balance assessment: Needs assistance   Sitting balance-Leahy Scale: Fair   Postural control: Posterior lean   Standing balance-Leahy Scale: Poor Standing balance comment: posterior lean, forward placement of B feet and resistant to facilitation to correct.                              Cognition Arousal/Alertness: Awake/alert Behavior During Therapy: Anxious Overall Cognitive Status: Impaired/Different from baseline Area of Impairment: Attention;Memory;Safety/judgement;Awareness;Problem solving                   Current Attention Level: Sustained Memory: Decreased recall of precautions;Decreased short-term memory Following Commands: Follows one step commands consistently  Safety/Judgement: Decreased awareness of safety;Decreased awareness of deficits     General Comments: tangential at times; talking about doing        Exercises      General Comments        Pertinent Vitals/Pain Pain Assessment: 0-10 Faces Pain Scale: Hurts little more Pain Location: generalized discomfort Pain Descriptors / Indicators: Grimacing;Guarding Pain Intervention(s): Monitored during session;Repositioned    Home Living                      Prior Function            PT Goals (current goals can now be found in the care plan section) Acute Rehab PT Goals Patient Stated Goal: to walk Potential to Achieve Goals: Good Progress towards PT goals: Progressing toward goals    Frequency    Min 3X/week      PT Plan Current plan remains appropriate    Co-evaluation PT/OT/SLP Co-Evaluation/Treatment: Yes Reason for Co-Treatment: Necessary to address cognition/behavior during functional activity;Complexity of the patient's impairments (multi-system involvement);For patient/therapist safety PT goals addressed during session: Mobility/safety with mobility;Balance OT goals addressed during session: ADL's and self-care      AM-PAC PT "6 Clicks" Daily Activity  Outcome Measure  Difficulty turning over in bed (including adjusting bedclothes, sheets and blankets)?: Unable Difficulty moving from lying on back to sitting on the side of the bed? : Unable Difficulty sitting down on and standing up from a chair with arms (e.g., wheelchair, bedside commode, etc,.)?: Unable Help needed moving to and from a bed to chair (including a wheelchair)?: A Lot Help needed walking in hospital room?: A Lot Help needed climbing 3-5 steps with a railing? : Total 6 Click Score: 8    End of Session Equipment Utilized During Treatment: Gait belt;Oxygen Activity Tolerance: Patient tolerated treatment well;Patient limited by fatigue Patient left: in bed;with call bell/phone within reach;with bed alarm set Nurse Communication: Mobility status PT Visit Diagnosis: Other abnormalities of gait and mobility (R26.89);Muscle weakness  (generalized) (M62.81)     Time: 4944-9675 PT Time Calculation (min) (ACUTE ONLY): 30 min  Charges:  $Gait Training: 8-22 mins                    G Codes:       Governor Rooks, PTA pager [phone]    Cristela Blue 08/10/2017, 9:54 AM

## 2017-08-10 NOTE — H&P (Signed)
Physical Medicine and Rehabilitation Admission H&P    Chief complaint: Weakness  HPI: [name] is a 77 y.o. right handed female who is been followed by cardiothoracic surgery for a fusiform ascending aneurysm which has increased in size over the past 1-2 years with associated moderate aortic insufficiency. Per chart review patient lives with spouse. Independent with assistive device using a cane prior to admission. One level home 4 steps to entry. Spouse can assist as needed. Presented 07/09/2017 due to increasing size of a ascending aneurysm and underwent repair of ascending aneurysm as well as aortic valve replacement 07/10/2017 per Dr. Nils Pyle. Postoperative ventilatory support. Findings a small amount of anterior mediastinal hematoma as well as orthostasis requiring pressor support. Her urine output decreased concern for tamponade and return back to the operating room for reexploration to remove any blood/hematoma 07/11/2017 as well as findings RV function significantly decreased from earlier postoperative status and underwent implantation of percutaneous right ventricular assist device. Hospital course ventilatory support underwent tracheostomy 07/25/2017 per Dr. Nelda Marseille and has since been down sized to a #4 cuff less 08/08/2017 and tolerating PMV. No current plan to decannulate as did not tolerate capping trial. Hospital course acute blood loss anemia. Bouts of atrial fibrillation followed by cardiology services and currently maintained on amiodarone. Currently maintained on subcutaneous Lovenox for DVT prophylaxis. Bouts of hypokalemia supplement added. Dysphagia #1 pudding liquids and nasogastric tube feeds for nutritional support. Physical as well as occupational therapy evaluations completed 07/19/2017. Request made for physical medicine rehabilitation consult. Patient was admitted for a comprehensive rehabilitation program    Review of Systems  Constitutional: Negative for chills  and fever.  HENT: Negative for hearing loss.   Eyes: Negative for blurred vision and double vision.  Respiratory: Positive for shortness of breath.   Cardiovascular: Positive for palpitations and leg swelling. Negative for chest pain.  Gastrointestinal: Positive for constipation. Negative for nausea.       GERD  Genitourinary: Negative for dysuria, flank pain and hematuria.  Musculoskeletal: Positive for joint pain and myalgias.  Skin: Negative for rash.  Neurological: Positive for tremors.  Psychiatric/Behavioral: Positive for depression. The patient has insomnia.        Anxiety  All other systems reviewed and are negative.  Past Medical History:  Diagnosis Date  . ACL tear    right  Dr. Gladstone Pih   . Adenomatous polyp   . Anxiety   . Aorta aneurysm ([place])   . Arthritis   . Ascending aortic aneurysm ([place])    note per chart per Dr Lucianne Lei Tright 4.7 cm 04/15/2015   . B12 deficiency   . Cancer ([place])   . Cataracts, both eyes 10/2006  . Chronic bronchitis ([place])   . Constipation   . Depression   . DUB (dysfunctional uterine bleeding) 10/96  . ETD (eustachian tube dysfunction)   . Fall   . Fibromyalgia   . GERD (gastroesophageal reflux disease)   . Glaucoma    ([facility]) Dr. Arnoldo Morale   . Glaucoma    bilaterally  . Helicobacter pylori (H. pylori)   . Hiatal hernia   . History of bronchitis   . History of frequent urinary tract infections   . History of kidney stones   . History of left shoulder fracture    pt states fell off bed and broke ball in shoulder had rod placed   . Hyperlipidemia   . Hypertension   . Hypothyroidism   . Insomnia   . Leg  wound, left    healed   . MVP (mitral valve prolapse)   . Occasional tremors    left arm   . Osteoarthritis   . Osteopenia   . Osteoporosis   . Other and unspecified hyperlipidemia   . Post-menopausal   . Thyroid cancer ([place])   . Thyroid nodule   . Tremor    Past Surgical History:  Procedure Laterality Date  . AORTIC VALVE  REPLACEMENT N/A 07/10/2017   Procedure: AORTIC VALVE REPLACEMENT (AVR);  Surgeon: [name], Collier Salina, MD;  Location: [facility];  Service: Open Heart Surgery;  Laterality: N/A;  . APPENDECTOMY    . BUNIONECTOMY  08/1999   right - Dr. Irving Shows   . CHOLECYSTECTOMY  1979  . DILATION AND CURETTAGE OF UTERUS  03/31/95   Dr. Ovid Curd   . EXPLORATION POST OPERATIVE OPEN HEART N/A 07/11/2017   Procedure: EXPLORATION POST OPERATIVE OPEN HEART;  Surgeon: Ivin Poot, MD;  Location: [facility];  Service: Open Heart Surgery;  Laterality: N/A;  . EYE SURGERY     also had left cataract removed   . FRACTURE SURGERY    . HEMATOMA EVACUATION N/A 07/11/2017   Procedure: EVACUATION HEMATOMA;  Surgeon: Ivin Poot, MD;  Location: [facility];  Service: Open Heart Surgery;  Laterality: N/A;  . HERNIA REPAIR    . NEPHROLITHOTOMY Left 04/07/2017   Procedure: NEPHROLITHOTOMY PERCUTANEOUS WITH SURGEON ACCESS;  Surgeon: Ardis Hughs, MD;  [facility];  Service: Urology;  Laterality: Left;  . ORIF HUMERUS FRACTURE  05/30/2011   Procedure: OPEN REDUCTION INTERNAL FIXATION (ORIF) PROXIMAL HUMERUS FRACTURE;  Surgeon: Augustin Schooling;  Location: [facility];  Service: Orthopedics;  Laterality: Left;  open reduction internal fixation of proximal humerus fracture  . PLACEMENT OF CENTRIMAG VENTRICULAR ASSIST DEVICE Right 07/11/2017   Procedure: PLACEMENT OF CENTRIMAG VENTRICULAR ASSIST DEVICE;  Surgeon: Ivin Poot, MD;  Location: [facility];  Service: Open Heart Surgery;  Laterality: Right;  . REMOVAL OF CENTRIMAG VENTRICULAR ASSIST DEVICE N/A 07/17/2017   Procedure: REMOVAL OF RVAD WITH PUMP STANDBY;  Surgeon: Ivin Poot, MD;  Location: [facility];  Service: Open Heart Surgery;  Laterality: N/A;  . REPLACEMENT ASCENDING AORTA N/A 07/10/2017   Procedure: REPLACEMENT ASCENDING AORTA;  Surgeon: Ivin Poot, MD;  Location: [facility];  Service: Open Heart Surgery;  Laterality: N/A;  . right knee replacement  3/11   Dr. Gladstone Pih  .  RIGHT/LEFT HEART CATH AND CORONARY ANGIOGRAPHY N/A 06/13/2017   Procedure: RIGHT/LEFT HEART CATH AND CORONARY ANGIOGRAPHY;  Surgeon: Jolaine Artist, MD;  Location: [facility];  Service: Cardiovascular;  Laterality: N/A;  . rt. eye cataract  05/28/07  . TEE WITHOUT CARDIOVERSION N/A 07/10/2017   Procedure: TRANSESOPHAGEAL ECHOCARDIOGRAM (TEE);  Surgeon: [name], Collier Salina, MD;  Location: [facility];  Service: Open Heart Surgery;  Laterality: N/A;  . TEE WITHOUT CARDIOVERSION  07/11/2017   Procedure: TRANSESOPHAGEAL ECHOCARDIOGRAM (TEE);  Surgeon: [name], Collier Salina, MD;  Location: [facility];  Service: Open Heart Surgery;;  . TEE WITHOUT CARDIOVERSION N/A 07/17/2017   Procedure: TRANSESOPHAGEAL ECHOCARDIOGRAM (TEE);  Surgeon: [name], Collier Salina, MD;  Location: [facility];  Service: Open Heart Surgery;  Laterality: N/A;  . THYROID LOBECTOMY Left 07/27/2015   Procedure: LEFT THYROID LOBECTOMY;  Surgeon: Fanny Skates, MD;  [facility];  Service: General;  Laterality: Left;  . THYROIDECTOMY N/A 08/06/2015   Procedure: RIGHT THYROID LOBECTOMY, REIMPLANTATION PARATHYROID;  Surgeon: Fanny Skates, MD;  Location[facility];  Service: General;  Laterality: N/A;  . VENTRAL HERNIA REPAIR  2/99   Family History  Problem Relation Age of Onset  . Cancer Mother        PANCREATIC   . Osteoporosis Mother   . Hip fracture Mother   . Heart attack Father 63       Died 68  . Heart disease Father   . Arthritis Father   . Hypertension Sister   . Cancer Sister        skin  . Hypertension Sister   . Breast cancer Neg Hx    Social History:  reports that  has never smoked. she has never used smokeless tobacco. She reports that she does not drink alcohol or use drugs. Allergies:  Allergies  Allergen Reactions  . Alendronate Sodium Nausea Only  . Levofloxacin Nausea And Vomiting  . Lipitor [Atorvastatin Calcium] Other (See Comments)    myalgias  . Lyrica [Pregabalin] Other (See Comments)    SORES IN MOUTH   .  Meloxicam Other (See Comments)    Vaginal itching   . Sibutramine Hcl Monohydrate Other (See Comments)    Reaction unknown  . Actonel [Risedronate] Other (See Comments)    Bone pain and nausea   . Latex Rash    RA to latex 1982; includes bandaids     Facility-Administered Medications Prior to Admission  Medication Dose Route Frequency Provider Last Rate Last Dose  . cyanocobalamin ((VITAMIN B-12)) injection 1,000 mcg  1,000 mcg Intramuscular Q30 days Chipper Herb, MD   1,000 mcg at 06/07/17 1004   Medications Prior to Admission  Medication Sig Dispense Refill  . aspirin 81 MG tablet Take 1 tablet (81 mg total) by mouth every other day. (Patient taking differently: Take 81 mg by mouth at bedtime. ) 30 tablet   . atenolol (TENORMIN) 50 MG tablet TAKE 1 TABLET DAILY (Patient taking differently: TAKE 1 TABLET BY MOUTH DAILY) 30 tablet 5  . gabapentin (NEURONTIN) 100 MG capsule Take 100 mg by mouth at bedtime.    Marland Kitchen levothyroxine (SYNTHROID, LEVOTHROID) 100 MCG tablet Take 100 mcg by mouth every other day. Alternates and takes 100 mcg one day and 112 mcg the next    . levothyroxine (SYNTHROID, LEVOTHROID) 112 MCG tablet Take 112 mcg by mouth daily before breakfast. Alternates and takes 100 mcg one day and 112 mcg the next  4  . Omega 3 1200 MG CAPS Take 1,200 mg by mouth at bedtime.    . polyethylene glycol (MIRALAX / GLYCOLAX) packet Take 17 g by mouth daily.     Marland Kitchen PROCTOZONE-HC 2.5 % rectal cream APPLY TO RECTALLY 2 TIMES A DAY AS NEEDED (Patient taking differently: APPLY TO RECTALLY 1 TIMES A DAY AT BEDTIME) 30 g 1  . Simethicone (GAS-X PO) Take 1 tablet by mouth daily as needed (GAS).    Marland Kitchen simvastatin (ZOCOR) 80 MG tablet TAKE ONE TABLET AT BEDTIME (Patient taking differently: TAKE ONE HALF (40 MG) TABLET BY MOUTH DAILY) 30 tablet 5  . TRAVATAN Z 0.004 % SOLN ophthalmic solution INSTILL 1 DROP INTO RIGHT EYE AT BEDTIME (Patient taking differently: INSTILL 1 DROP INTO BOTH EYES AT BEDTIME)  2.5 mL 2  . venlafaxine XR (EFFEXOR-XR) 75 MG 24 hr capsule TAKE (1) CAPSULE DAILY 90 capsule 0  . zolpidem (AMBIEN) 10 MG tablet TAKE 1/2 TABLET AT BEDTIME AS NEEDED (Patient taking differently: TAKE 0.5 TABLET (5 MG) BY MOUTH AT BEDTIME.) 45 tablet 2  .  celecoxib (CELEBREX) 200 MG capsule TAKE (1) CAPSULE DAILY (Patient not taking: Reported on 06/28/2017) 30 capsule 0  . clobetasol cream (TEMOVATE) 5.46 % Apply 1 application topically 2 (two) times daily. (Patient not taking: Reported on 06/28/2017) 30 g 2  . clorazepate (TRANXENE) 7.5 MG tablet TAKE 1 TABLET DAILY AS NEEDED (Patient taking differently: TAKE 1 TABLET DAILY AS NEEDED FOR PAIN) 30 tablet 2  . docusate sodium (COLACE) 100 MG capsule Take 2 capsules (200 mg total) by mouth 2 (two) times daily. (Patient not taking: Reported on 06/28/2017) 120 capsule 11  . famotidine (PEPCID) 20 MG tablet Take 1 tablet (20 mg total) by mouth 2 (two) times daily. (Patient not taking: Reported on 06/28/2017) 60 tablet 1  . fluticasone (FLONASE) 50 MCG/ACT nasal spray Place 2 sprays into both nostrils daily. (Patient not taking: Reported on 06/28/2017) 16 g 6  . mupirocin ointment (BACTROBAN) 2 % Apply 1 application topically 2 (two) times daily. (Patient not taking: Reported on 06/28/2017) 22 g 1  . traMADol (ULTRAM) 50 MG tablet Take 1-2 tablets (50-100 mg total) by mouth every 6 (six) hours as needed for moderate pain. (Patient not taking: Reported on 06/28/2017) 20 tablet 0  . triamcinolone cream (KENALOG) 0.1 % Apply 1 application topically 3 (three) times daily. Avoid face and genitalia (Patient not taking: Reported on 06/28/2017) 28.4 g 0    Drug Regimen Review Drug regimen was reviewed and remains appropriate with no significant issues identified  Home: Home Living Family/patient expects to be discharged to:: Private residence Living Arrangements: Spouse/significant other Available Help at Discharge: Family, Available 24 hours/day Type of Home: Mobile  home Home Access: Stairs to enter [employer] of Steps: 4 Entrance Stairs-Rails: Right, Left, Can reach both Home Layout: One level Bathroom Shower/Tub: Multimedia programmer: Handicapped height Home Equipment: [place] - single point Additional Comments: ? may have 02 in home   Functional History: Prior Function Level of Independence: Independent with assistive device(s) Comments: walked with cane, sponge bathed and washed hair over sink, worked together with husband on housekeeping and cooking, drives rarely  Functional Status:  Mobility: Bed Mobility Overal bed mobility: Needs Assistance Bed Mobility: Supine to Sit Rolling: Min assist, +2 for physical assistance Sidelying to sit: Max assist, +2 for physical assistance, HOB elevated Supine to sit: Min assist, HOB elevated Sit to supine: Mod assist, +2 for physical assistance Sit to sidelying: Max assist, +2 for physical assistance General bed mobility comments: Cues for hand placement to avoid pulling.  Pt required assistance to lower trunk and lift B LEs into bed.   Transfers Overall transfer level: Needs assistance Equipment used: 4-wheeled walker(EVA walker) Transfers: Sit to/from Stand Sit to Stand: Max assist, +2 physical assistance Stand pivot transfers: +2 physical assistance, Max assist Squat pivot transfers: Max assist, +2 safety/equipment, +2 physical assistance General transfer comment: Cues for hand placement on knees to and from seated surface to maintain sternal precautions.  Pt followed commands better.  Pt required cues for hip extension and anterior translation into standing.  Pt walks B feet forward and flexes B hips.  Pt unable to correct with VCs and requires tactile cues to facilitate extension.   Ambulation/Gait Ambulation/Gait assistance: Max assist, +2 physical assistance Ambulation Distance (Feet): 34 Feet Assistive device: 4-wheeled walker(EVA) Gait Pattern/deviations: Shuffle, Trunk  flexed, Decreased stride length, Leaning posteriorly, Narrow base of support, Scissoring General Gait Details: Unsteady ambulation using Eva walker and maxA+2 to maintain balance and trunk elevation, and control walker.  Pt with significant posterior lean, with feet too far forward and increased difficulty translating her trunk forward despite multimodal cues. Pt presents with scissoring narrow BOS and cues to increase BOS.   Gait velocity: Decreased Gait velocity interpretation: Below normal speed for age/gender    ADL: ADL Overall ADL's : Needs assistance/impaired Eating/Feeding: Moderate assistance Eating/Feeding Details (indicate cue type and reason): pudding thick liquids 2/12 Grooming: Minimal assistance, Wash/dry face, Brushing hair Grooming Details (indicate cue type and reason): Pt able to bring hand to face and wash eyes with wash cloth. Upper Body Bathing: Maximal assistance, Sitting Lower Body Bathing: Total assistance, Bed level Upper Body Dressing : Moderate assistance Upper Body Dressing Details (indicate cue type and reason): Pt assisted by lifting BUE to put through [employer] Transfer: Maximal assistance, +2 for physical assistance, +2 for safety/equipment, Ambulation(EVA walker) Toilet Transfer Details (indicate cue type and reason): simualted through recliner transfer [place] and Hygiene: Total assistance Toileting - Clothing Manipulation Details (indicate cue type and reason): Pt able to stand with +2 A while pericare was completed Functional mobility during ADLs: Maximal assistance, +2 for physical assistance, Cueing for sequencing, Cueing for safety(Eva walker) General ADL Comments: Used tray mirror to facilitate anterior weight shifts during ADL tasks. Pt states she will do better if she has her tennis shoes; husband present at end of session and asked to bring in shoes. Speech hasd finished prior to OT session adn pt is on pudding thick  diet. Pt requetingto drink a coke. PT demonstrates poor insight into understandingher swallowing restricitons  Cognition: Cognition Overall Cognitive Status: Impaired/Different from baseline Orientation Level: Oriented to person, Oriented to place, Oriented to time, Oriented to situation, Disoriented to situation(intermittent) Cognition Arousal/Alertness: Awake/alert Behavior During Therapy: Anxious Overall Cognitive Status: Impaired/Different from baseline Area of Impairment: Attention, Memory, Safety/judgement, Awareness, Problem solving Current Attention Level: Sustained Memory: Decreased recall of precautions, Decreased short-term memory Following Commands: Follows one step commands consistently Safety/Judgement: Decreased awareness of safety, Decreased awareness of deficits Awareness: Emergent Problem Solving: Slow processing, Difficulty sequencing, Requires tactile cues, Requires verbal cues General Comments: tangential at times; talking about doing  Difficult to assess due to: (PMV in place.  )  Physical Exam: Blood pressure 127/79, pulse 91, temperature 97.9 F (36.6 C), temperature source Oral, resp. rate 18, height 5' 5" (1.651 m), weight 73.1 kg (161 lb 2.5 oz), SpO2 100 %. Physical Exam  Vitals reviewed. HENT:  Head: Normocephalic.  Nasogastric tube in place. Raspy voice  Eyes:  Pupils round and reactive to light  Neck:  Trach tube in place  Cardiovascular: Normal rate and regular rhythm. Exam reveals no friction rub.  No murmur heard. Cardiac rate controlled  Respiratory: No respiratory distress. She has no wheezes. She has no rales.  Fair inspiratory effort. Clear to auscultation  GI: Soft. Bowel sounds are normal. She exhibits no distension. There is no tenderness. There is no rebound.  Musculoskeletal: She exhibits no tenderness or deformity.  Neurological:  Sitting up in chair. Makes good eye contact with examiner.   Followed full commands. Provides her name  agent date of birth. Moves all extremities. UE 4- to 4/5 prox to distal. LE: 2 to 2+ HF, 3- KE and 3+ ADF/PF. No focal sensory deficits  Psychiatric: She has a normal mood and affect. Her behavior is normal.       Results for orders placed or performed during the hospital encounter of 07/10/17 (from the past 48 hour(s))  Glucose, capillary  Status: None   Collection Time: 08/08/17 11:53 AM  Result Value Ref Range   Glucose-Capillary 98 65 - 99 mg/dL   Comment 1 Capillary Specimen    Comment 2 Notify RN   Glucose, capillary     Status: Abnormal   Collection Time: 08/08/17  3:58 PM  Result Value Ref Range   Glucose-Capillary 116 (H) 65 - 99 mg/dL   Comment 1 Capillary Specimen    Comment 2 Notify RN   Glucose, capillary     Status: Abnormal   Collection Time: 08/08/17  7:50 PM  Result Value Ref Range   Glucose-Capillary 116 (H) 65 - 99 mg/dL   Comment 1 Capillary Specimen   Glucose, capillary     Status: Abnormal   Collection Time: 08/08/17 11:23 PM  Result Value Ref Range   Glucose-Capillary 148 (H) 65 - 99 mg/dL   Comment 1 Capillary Specimen   Basic metabolic panel     Status: Abnormal   Collection Time: 08/09/17  3:02 AM  Result Value Ref Range   Sodium 146 (H) 135 - 145 mmol/L   Potassium 3.1 (L) 3.5 - 5.1 mmol/L   Chloride 106 101 - 111 mmol/L   CO2 28 22 - 32 mmol/L   Glucose, Bld 106 (H) 65 - 99 mg/dL   BUN 36 (H) 6 - 20 mg/dL   Creatinine, Ser 0.58 0.44 - 1.00 mg/dL   Calcium 7.4 (L) 8.9 - 10.3 mg/dL   GFR calc non Af Amer >60 >60 mL/min   GFR calc Af Amer >60 >60 mL/min    Comment: (NOTE) The eGFR has been calculated using the CKD EPI equation. This calculation has not been validated in all clinical situations. eGFR's persistently <60 mL/min signify possible Chronic Kidney Disease.    Anion gap 12 5 - 15    Comment: Performed at [facility] [address] [place], [place] [address]  Magnesium     Status: None   Collection Time: 08/09/17  3:02 AM   Result Value Ref Range   Magnesium 1.9 1.7 - 2.4 mg/dL    Comment: Performed at [facility] [address] [place], [place] [address]  Phosphorus     Status: None   Collection Time: 08/09/17  3:02 AM  Result Value Ref Range   Phosphorus 3.1 2.5 - 4.6 mg/dL    Comment: Performed at [facility] [address], [place], [place] [address]  Glucose, capillary     Status: Abnormal   Collection Time: 08/09/17  3:41 AM  Result Value Ref Range   Glucose-Capillary 107 (H) 65 - 99 mg/dL   Comment 1 Capillary Specimen   Glucose, capillary     Status: Abnormal   Collection Time: 08/09/17  7:35 AM  Result Value Ref Range   Glucose-Capillary 100 (H) 65 - 99 mg/dL   Comment 1 Notify RN   Glucose, capillary     Status: Abnormal   Collection Time: 08/09/17 11:16 AM  Result Value Ref Range   Glucose-Capillary 116 (H) 65 - 99 mg/dL   Comment 1 Notify RN   Glucose, capillary     Status: Abnormal   Collection Time: 08/09/17  4:07 PM  Result Value Ref Range   Glucose-Capillary 117 (H) 65 - 99 mg/dL   Comment 1 Notify RN   Glucose, capillary     Status: Abnormal   Collection Time: 08/09/17  7:43 PM  Result Value Ref Range   Glucose-Capillary 131 (H)  65 - 99 mg/dL   Comment 1 Notify RN   Glucose, capillary     Status: None   Collection Time: 08/09/17 11:57 PM  Result Value Ref Range   Glucose-Capillary 97 65 - 99 mg/dL   Comment 1 Notify RN   Glucose, capillary     Status: None   Collection Time: 08/10/17  3:34 AM  Result Value Ref Range   Glucose-Capillary 91 65 - 99 mg/dL   Comment 1 Capillary Specimen   Basic metabolic panel     Status: Abnormal   Collection Time: 08/10/17  4:15 AM  Result Value Ref Range   Sodium 145 135 - 145 mmol/L   Potassium 3.4 (L) 3.5 - 5.1 mmol/L   Chloride 102 101 - 111 mmol/L   CO2 31 22 - 32 mmol/L   Glucose, Bld 105 (H) 65 - 99 mg/dL   BUN 35 (H) 6 - 20 mg/dL   Creatinine, Ser 0.58 0.44 - 1.00 mg/dL   Calcium 7.7 (L) 8.9 - 10.3 mg/dL    GFR calc non Af Amer >60 >60 mL/min   GFR calc Af Amer >60 >60 mL/min    Comment: (NOTE) The eGFR has been calculated using the CKD EPI equation. This calculation has not been validated in all clinical situations. eGFR's persistently <60 mL/min signify possible Chronic Kidney Disease.    Anion gap 12 5 - 15    Comment: Performed at [facility] [address] [place], [place] [address]  Glucose, capillary     Status: None   Collection Time: 08/10/17  7:43 AM  Result Value Ref Range   Glucose-Capillary 92 65 - 99 mg/dL   Comment 1 Capillary Specimen    Dg Chest Port 1 View  Result Date: 08/10/2017 CLINICAL DATA:  Recent aortic valve replacement. Chronic bronchitis. EXAM: PORTABLE CHEST 1 VIEW COMPARISON:  August 08, 2017 FINDINGS: Tracheostomy catheter tip is 6.4 cm above the carina. Central catheter tip is at the cavoatrial junction. Feeding tube tip is below the diaphragm, likely in the duodenum. No pneumothorax. There is atelectatic change in the left base. The lungs elsewhere are clear. Heart is borderline enlarged, stable. Pulmonary vascularity is normal. No adenopathy. Patient is status post aortic valve replacement. There is postoperative change in the proximal left humerus as well as in the right axillary region. There are also surgical clips in the region of the thyroid. IMPRESSION: Tube and catheter positions as described without pneumothorax. Left base atelectasis. Lungs elsewhere clear. Stable cardiac silhouette. Areas of postoperative change at several sites, stable. Electronically Signed   By: Lowella Grip III M.D.   On: 08/10/2017 09:00       Medical Problem List and Plan: 1.  Debility secondary to debility related to abdominal aneurysm/AS with AVR 07/10/2017 status post repair and subsequent cardiogenic shock  -admit to inpatient rehab 2.  DVT Prophylaxis/Anticoagulation: Subcutaneous Lovenox. Check vascular study 3. Pain Management: Tylenol as needed 4.  Mood: Klonopin 1 mg daily at bedtime, Seroquel 75 mg bedtime, Effexor 37.5 mg twice a day 5. Neuropsych: This patient is capable of making decisions on her own behalf. 6. Skin/Wound Care: Routine skin checks 7. Fluids/Electrolytes/Nutrition: Routine I&O's with follow-up chemistries 8. Tracheostomy 07/25/2017. Downsized to #4 cuffless 08/08/2017. No current plan to decannulate due to failure with capping.  Follow-up per Dr. Nelda Marseille. Continue nebulizers 9. Atrial fibrillation. Amiodarone 200 mg daily. Cardiac rate controlled 10. Dysphagia. Dysphagia #1 pudding liquids. Follow-up speech therapy 11. Acute diastolic congestive heart  failure. Lasix 40 mg daily, Aldactone 25 mg daily. Monitor for any signs of fluid overload 12. Hypothyroidism. Synthroid 13. Hyperglycemia secondary to tube feeds. Continue to monitor blood sugar until after tube feeds discontinued  Post Admission Physician Evaluation: 1. Functional deficits secondary  to debility. 2. Patient is admitted to receive collaborative, interdisciplinary care between the physiatrist, rehab nursing staff, and therapy team. 3. Patient's level of medical complexity and substantial therapy needs in context of that medical necessity cannot be provided at a lesser intensity of care such as a SNF. 4. Patient has experienced substantial functional loss from his/her baseline which was documented above under the "Functional History" and "Functional Status" headings.  Judging by the patient's diagnosis, physical exam, and functional history, the patient has potential for functional progress which will result in measurable gains while on inpatient rehab.  These gains will be of substantial and practical use upon discharge  in facilitating mobility and self-care at the household level. 5. Physiatrist will provide 24 hour management of medical needs as well as oversight of the therapy plan/treatment and provide guidance as appropriate regarding the interaction of  the two. 6. The Preadmission Screening has been reviewed and patient status is unchanged unless otherwise stated above. 7. 24 hour rehab nursing will assist with bladder management, bowel management, safety, skin/wound care, disease management, medication administration, pain management and patient education  and help integrate therapy concepts, techniques,education, etc. 8. PT will assess and treat for/with: Lower extremity strength, range of motion, stamina, balance, functional mobility, safety, adaptive techniques and equipment, NMR, pain control, respiratory tolerance.   Goals are: min to mod assist. 9. OT will assess and treat for/with: ADL's, functional mobility, safety, upper extremity strength, adaptive techniques and equipment, NMR, activity tolerance, family ed.   Goals are: min to mod assist. Therapy may proceed with showering this patient. 10. SLP will assess and treat for/with: speech, swallowing.  Goals are: mod I to supervision. 11. Case Management and Social Worker will assess and treat for psychological issues and discharge planning. 12. Team conference will be held weekly to assess progress toward goals and to determine barriers to discharge. 13. Patient will receive at least 3 hours of therapy per day at least 5 days per week. 14. ELOS: 15-20 days       15. Prognosis:  excellent     Meredith Staggers, MD, [facility] 08/12/2017  Lavon Paganini [place], PA-C 08/10/2017

## 2017-08-11 LAB — BASIC METABOLIC PANEL
ANION GAP: 12 (ref 5–15)
BUN: 35 mg/dL — AB (ref 6–20)
CO2: 30 mmol/L (ref 22–32)
Calcium: 7.7 mg/dL — ABNORMAL LOW (ref 8.9–10.3)
Chloride: 103 mmol/L (ref 101–111)
Creatinine, Ser: 0.54 mg/dL (ref 0.44–1.00)
GFR calc Af Amer: >60 mL/min (ref >60)
GLUCOSE: 117 mg/dL — AB (ref 65–99)
POTASSIUM: 3.3 mmol/L — AB (ref 3.5–5.1)
SODIUM: 145 mmol/L (ref 135–145)

## 2017-08-11 LAB — CBC
HCT: 38.5 % (ref 36.0–46.0)
Hemoglobin: 11.4 g/dL — ABNORMAL LOW (ref 12.0–15.0)
MCH: 29.5 pg (ref 26.0–34.0)
MCHC: 29.6 g/dL — ABNORMAL LOW (ref 30.0–36.0)
MCV: 99.7 fL (ref 78.0–100.0)
Platelets: 212 10*3/uL (ref 150–400)
RBC: 3.86 MIL/uL — ABNORMAL LOW (ref 3.87–5.11)
RDW: 16.3 % — ABNORMAL HIGH (ref 11.5–15.5)
WBC: 7.1 10*3/uL (ref 4.0–10.5)

## 2017-08-11 LAB — GLUCOSE, CAPILLARY
Glucose-Capillary: 105 mg/dL — ABNORMAL HIGH (ref 65–99)
Glucose-Capillary: 117 mg/dL — ABNORMAL HIGH (ref 65–99)
Glucose-Capillary: 89 mg/dL (ref 65–99)
Glucose-Capillary: 74 mg/dL (ref 65–99)
Glucose-Capillary: 124 mg/dL — ABNORMAL HIGH (ref 65–99)
Glucose-Capillary: 122 mg/dL — ABNORMAL HIGH (ref 65–99)

## 2017-08-11 MED ORDER — ORAL CARE MOUTH RINSE
15.0000 mL | Freq: Two times a day (BID) | OROMUCOSAL | Status: DC
  Administered 2017-08-12: 15 mL via OROMUCOSAL

## 2017-08-11 MED ORDER — POTASSIUM CHLORIDE 10 MEQ/50ML IV SOLN
10.0000 meq | INTRAVENOUS | Status: AC
  Administered 2017-08-11 (×2): 10 meq via INTRAVENOUS
  Filled 2017-08-11 (×2): qty 50

## 2017-08-11 MED ORDER — POTASSIUM CHLORIDE 20 MEQ/15ML (10%) PO SOLN: 20.0000 meq | ORAL | Status: AC

## 2017-08-11 MED ORDER — CHLORHEXIDINE GLUCONATE 0.12 % MT SOLN
15.0000 mL | Freq: Two times a day (BID) | OROMUCOSAL | Status: DC
  Administered 2017-08-11 – 2017-08-12 (×2): 15 mL via OROMUCOSAL
  Filled 2017-08-11 (×2): qty 15

## 2017-08-11 NOTE — Progress Notes (Signed)
Inpatient Rehabilitation  I have received insurance approval for an IP Rehab admission from [facility].  Determining bed availability for a potential admission 2/15, if not then we have a bed to offer Saturday, 2/16.  Plan to meet with patient and spouse at bedside at 11am.  Will update team as I know.  Call if questions.   Carmelia Roller., CCC/SLP Admission Coordinator  [place]  Cell [phone]

## 2017-08-11 NOTE — Progress Notes (Signed)
25 Days Post-Op Procedure(s) (LRB): REMOVAL OF RVAD WITH PUMP STANDBY (N/A) TRANSESOPHAGEAL ECHOCARDIOGRAM (TEE) (N/A) Subjective:  up in chair, ambulates in hall now Vital stable New trach in place Ready for transfer out ICU  Objective: Vital signs in last 24 hours: Temp:  [97.8 F (36.6 C)-98.4 F (36.9 C)] 98 F (36.7 C) (02/14 2300) Pulse Rate:  [68-93] 81 (02/15 0700) Cardiac Rhythm: Normal sinus rhythm (02/15 0400) Resp:  [13-25] 21 (02/15 0700) BP: (94-145)/(48-98) 121/72 (02/15 0700) SpO2:  [85 %-100 %] 100 % (02/15 0700) FiO2 (%):  [28 %] 28 % (02/15 0339) Weight:  [164 lb 3.9 oz (74.5 kg)] 164 lb 3.9 oz (74.5 kg) (02/15 0500)  Hemodynamic parameters for last 24 hours:  stable  Intake/Output from previous day: 02/14 0701 - 02/15 0700 In: 1090 [P.O.:120; NG/GT:920; IV Piggyback:50] Out: 1000 [Urine:1000] Intake/Output this shift: No intake/output data recorded.       Exam    General- alert and comfortable. incisions clean and dry    Neck- no JVD, no cervical adenopathy palpable, no carotid bruit   Lungs- clear without rales, wheezes   Cor- regular rate and rhythm, no murmur , gallop   Abdomen- soft, non-tender   Extremities - warm, non-tender, minimal edema   Neuro- oriented, appropriate, no focal weakness   Lab Results: Recent Labs    08/11/17 0346  WBC 7.1  HGB 11.4*  HCT 38.5  PLT 212   BMET:  Recent Labs    08/10/17 0415 08/11/17 0346  NA 145 145  K 3.4* 3.3*  CL 102 103  CO2 31 30  GLUCOSE 105* 117*  BUN 35* 35*  CREATININE 0.58 0.54  CALCIUM 7.7* 7.7*    PT/INR: No results for input(s): LABPROT, INR in the last 72 hours. ABG    Component Value Date/Time   PHART 7.502 (H) 08/03/2017 0347   HCO3 27.4 08/03/2017 0347   TCO2 28 08/03/2017 0347   ACIDBASEDEF 1.0 07/18/2017 0404   O2SAT 90.0 08/03/2017 0347   CBG (last 3)  Recent Labs    08/10/17 1930 08/10/17 2326 08/11/17 0359  GLUCAP 85 96 105*    Assessment/Plan: S/P  Procedure(s) (LRB): REMOVAL OF RVAD WITH PUMP STANDBY (N/A) TRANSESOPHAGEAL ECHOCARDIOGRAM (TEE) (N/A) Ready for CIR- will tx to 2 C pending approval for CIR   LOS: 32 days    [name] 08/11/2017

## 2017-08-11 NOTE — PMR Pre-admission (Signed)
PMR Admission Coordinator Pre-Admission Assessment  Patient: [name] is an 77 y.o., female MRN: [id number] DOB: 20-Jul-1940 Height: 5' 5" (165.1 cm) Weight: 74.5 kg (164 lb 3.9 oz)              Insurance Information HMO:     PPO: X     PCP:      IPA:      80/20:      OTHER:  PRIMARY: BCBS Medicare       Policy#: [id number]      Subscriber: Self  CM Name: Santiago Glad       Phone#: [phone]     Fax#: [phone] Pre-Cert#:              Employer: Retired  Benefits:  Phone #: [phone]     Name: Verified online at [email or web address] Eff. Date: 06/27/17     Deduct: $0      Out of Pocket Max: $5900      Life Max: N/A CIR: $310 a day, days 1-6; $0 a day, days 7+      SNF: $0 a day, days 1-20; $172 a day, days 21-60; $0 a day, days 61-100 Outpatient: Necessity      Co-Pay: $40 per visit  Home Health: Necessity       Co-Pay: $0 DME: 80%     Co-Pay: 20% Providers: In-network   SECONDARY: None      Policy#:       Subscriber:  CM Name:       Phone#:      Fax#:  Pre-Cert#:       Employer:  Benefits:  Phone #:      Name:  Eff. Date:      Deduct:       Out of Pocket Max:       Life Max:  CIR:       SNF:  Outpatient:      Co-Pay:  Home Health:       Co-Pay:  DME:      Co-Pay:   Medicaid Application Date:       Case Manager:  Disability Application Date:       Case Worker:   Emergency Contact Information Contact Information    Name Relation Home Work Mobile   Sawhney,Jimmy Spouse [phone]     [name] Daughter [phone]       Current Medical History  Patient Admitting Diagnosis: Debility related to abdominal aneurysm/AS s/p repair with subsequent complications.  Patient currently intubated/on the ventilator  History of Present Illness: [name] a 77 y.o.right handed femalewho is been followed by cardiothoracic surgery for a fusiform ascending aneurysm which has increased in size over the past 1-2 years with associated moderate aortic insufficiency. Per chart review  patient lives with spouse. Independent with assistive device using a cane prior to admission. One level home 4 steps to entry.Spouse can assist as needed.Presented 07/09/2017 due to increasing size of a ascending aneurysm and underwent repair of ascending aneurysm aswell as aortic valve replacement 07/10/2017 per Dr. Nils Pyle. Postoperative ventilatory support. Findings a small amount of anterior mediastinal hematoma as well as orthostasis requiring pressor support. Her urine output decreased concern fortamponadeand return back to the operating room for reexploration to remove any blood/hematoma 07/11/2017 as well as findings RV function significantly decreased from earlier postoperative status and underwent implantation of percutaneous right ventricular assist device. Hospital course ventilatory support underwent tracheostomy 07/25/2017 per Dr. Nelda Marseille and has since been down sized to  a #4 cuff less 08/08/2017 and tolerating PMV. No current plan to decannulate. Hospital course acute blood loss anemia. Bouts of atrial fibrillation followed by cardiology services and currently maintained on amiodarone. Currently maintained on subcutaneous Lovenox for DVT prophylaxis. Bouts of hypokalemia supplement added. Dysphagia 1 pudding-thick liquids and nasogastric tube feeds for nutritional support. Physical as well as occupational therapy evaluationscompleted 07/19/2017. Request made for physical medicine rehabilitation consult. Patient was admitted for a comprehensive rehabilitation program 08/12/17.        Past Medical History  Past Medical History:  Diagnosis Date  . ACL tear    right  Dr. Gladstone Pih   . Adenomatous polyp   . Anxiety   . Aorta aneurysm ([place])   . Arthritis   . Ascending aortic aneurysm ([place])    note per chart per Dr Lucianne Lei Tright 4.7 cm 04/15/2015   . B12 deficiency   . Cancer ([place])   . Cataracts, both eyes 10/2006  . Chronic bronchitis ([place])   . Constipation   . Depression   . DUB  (dysfunctional uterine bleeding) 10/96  . ETD (eustachian tube dysfunction)   . Fall   . Fibromyalgia   . GERD (gastroesophageal reflux disease)   . Glaucoma    ([place]) Dr. Arnoldo Morale   . Glaucoma    bilaterally  . Helicobacter pylori (H. pylori)   . Hiatal hernia   . History of bronchitis   . History of frequent urinary tract infections   . History of kidney stones   . History of left shoulder fracture    pt states fell off bed and broke ball in shoulder had rod placed   . Hyperlipidemia   . Hypertension   . Hypothyroidism   . Insomnia   . Leg wound, left    healed   . MVP (mitral valve prolapse)   . Occasional tremors    left arm   . Osteoarthritis   . Osteopenia   . Osteoporosis   . Other and unspecified hyperlipidemia   . Post-menopausal   . Thyroid cancer ([place])   . Thyroid nodule   . Tremor     Family History  family history includes Arthritis in her father; Cancer in her mother and sister; Heart attack (age of onset: 71) in her father; Heart disease in her father; Hip fracture in her mother; Hypertension in her sister and sister; Osteoporosis in her mother.  Prior Rehab/Hospitalizations:  Has the patient had major surgery during 100 days prior to admission? Yes  Current Medications   Current Facility-Administered Medications:  .  0.9 %  sodium chloride infusion, 250 mL, Intravenous, Continuous, [name], Collier Salina, MD, Last Rate: 10 mL/hr at 07/22/17 1820, 250 mL at 07/22/17 1820 .  acetaminophen (TYLENOL) solution 650 mg, 650 mg, Per Tube, Q6H PRN, Rush Farmer, MD, 650 mg at 08/10/17 0954 .  amiodarone (PACERONE) tablet 200 mg, 200 mg, Oral, Daily, [name], Collier Salina, MD, 200 mg at 08/11/17 0944 .  arformoterol (BROVANA) nebulizer solution 15 mcg, 15 mcg, Nebulization, BID, Erick Colace, NP, 15 mcg at 08/11/17 0805 .  aspirin EC tablet 325 mg, 325 mg, Oral, Daily, Stopped at 07/29/17 1037 **OR** aspirin chewable tablet 324 mg, 324 mg, Per Tube, Daily, [name],  Collier Salina, MD, 324 mg at 08/11/17 0945 .  budesonide (PULMICORT) nebulizer solution 0.5 mg, 0.5 mg, Nebulization, BID, Erick Colace, NP, 0.5 mg at 08/11/17 0802 .  chlorhexidine gluconate (MEDLINE KIT) (PERIDEX) 0.12 % solution 15 mL, 15 mL,  Mouth Rinse, BID, [name], Collier Salina, MD, 15 mL at 08/11/17 0932 .  Chlorhexidine Gluconate Cloth 2 % PADS 6 each, 6 each, Topical, Daily, [name], Collier Salina, MD, 6 each at 08/10/17 0600 .  clonazePAM (KLONOPIN) disintegrating tablet 1 mg, 1 mg, Oral, QHS, [name], Collier Salina, MD, 1 mg at 08/10/17 2100 .  enoxaparin (LOVENOX) injection 40 mg, 40 mg, Subcutaneous, Q24H, [name], Collier Salina, MD, 40 mg at 08/11/17 0943 .  feeding supplement (PRO-STAT SUGAR FREE 64) liquid 30 mL, 30 mL, Per Tube, TID, Rigoberto Noel, MD, 30 mL at 08/11/17 0942 .  feeding supplement (VITAL 1.5 CAL) liquid 1,000 mL, 1,000 mL, Per Tube, Continuous, Rigoberto Noel, MD, Last Rate: 40 mL/hr at 08/10/17 1900, 1,000 mL at 08/10/17 1900 .  furosemide (LASIX) tablet 40 mg, 40 mg, Oral, Daily, [name], Collier Salina, MD, 40 mg at 08/11/17 0948 .  Gerhardt's butt cream, , Topical, BID, [name], Peter, MD .  CBG monitoring, , , Q4H **AND** insulin aspart (novoLOG) injection 0-24 Units, 0-24 Units, Subcutaneous, Q4H, Ivin Poot, MD, 2 Units at 08/09/17 2109 .  insulin detemir (LEVEMIR) injection 10 Units, 10 Units, Subcutaneous, BID, [name], Collier Salina, MD, 10 Units at 08/11/17 [phone] .  lactated ringers infusion, , Intravenous, Continuous, [name], Collier Salina, MD, Stopped at 07/17/17 1200 .  latanoprost (XALATAN) 0.005 % ophthalmic solution 1 drop, 1 drop, Both Eyes, QHS, [name], Collier Salina, MD, 1 drop at 08/10/17 2102 .  levalbuterol (XOPENEX) nebulizer solution 0.63 mg, 0.63 mg, Nebulization, BID, [name], Collier Salina, MD, 0.63 mg at 08/11/17 0759 .  levalbuterol (XOPENEX) nebulizer solution 0.63 mg, 0.63 mg, Nebulization, Q6H PRN, [name], Collier Salina, MD .  levothyroxine (SYNTHROID, LEVOTHROID) tablet 100 mcg, 100  mcg, Oral, QAC breakfast, [name], Collier Salina, MD, 100 mcg at 08/11/17 0947 .  MEDLINE mouth rinse, 15 mL, Mouth Rinse, QID, [name], Peter, MD, 15 mL at 08/11/17 1200 .  metoprolol tartrate (LOPRESSOR) injection 2.5-5 mg, 2.5-5 mg, Intravenous, Q2H PRN, [name], Collier Salina, MD, 5 mg at 07/19/17 1246 .  naphazoline-glycerin (CLEAR EYES REDNESS) ophth solution 1-2 drop, 1-2 drop, Both Eyes, BID, [name], Collier Salina, MD, 2 drop at 08/11/17 [phone] .  ondansetron (ZOFRAN) injection 4 mg, 4 mg, Intravenous, Q6H PRN, [name], Collier Salina, MD .  pantoprazole (PROTONIX) injection 40 mg, 40 mg, Intravenous, Q12H, Erick Colace, NP, 40 mg at 08/11/17 0943 .  QUEtiapine (SEROQUEL) tablet 75 mg, 75 mg, Oral, QHS, [name], Collier Salina, MD, 75 mg at 08/10/17 2100 .  RESOURCE THICKENUP CLEAR, , Oral, PRN, [name], Collier Salina, MD .  sennosides (SENOKOT) 8.8 MG/5ML syrup 5 mL, 5 mL, Oral, BID, [name], Collier Salina, MD, 5 mL at 08/11/17 0955 .  sodium chloride flush (NS) 0.9 % injection 10-40 mL, 10-40 mL, Intracatheter, Q12H, [name], Collier Salina, MD, 10 mL at 08/11/17 1509 .  spironolactone (ALDACTONE) tablet 25 mg, 25 mg, Oral, Daily, Bensimhon, Shaune Pascal, MD, 25 mg at 08/11/17 0947 .  venlafaxine [facility]) tablet 37.5 mg, 37.5 mg, Oral, BID, [name], Collier Salina, MD, 37.5 mg at 08/11/17 9518  Patients Current Diet: DIET - DYS 1 Room service appropriate? Yes; Fluid consistency: Pudding Thick  Precautions / Restrictions Precautions Precautions: Fall, Sternal Precaution Comments: Reinforced sternal precautions Restrictions Weight Bearing Restrictions: No Other Position/Activity Restrictions: sternal precautions   Has the patient had 2 or more falls or a fall with injury in the past year?No  Prior Activity Level Limited Community (1-2x/wk): Prior to admission  patient went out when spouse drove her and they enjoyed going out to eat.    Home Assistive Devices / Equipment Home Assistive Devices/Equipment: Cane (specify quad or straight),  Walker (specify type), Shower chair with back Home Equipment: Cane - single point  Prior Device Use: Indicate devices/aids used by the patient prior to current illness, exacerbation or injury? Single point cane when out in the comminuty   Prior Functional Level Prior Function Level of Independence: Independent with assistive device(s) Comments: walked with cane, sponge bathed and washed hair over sink, worked together with husband on housekeeping and cooking, drives rarely  Self Care: Did the patient need help bathing, dressing, using the toilet or eating? Needed some help bathing  Indoor Mobility: Did the patient need assistance with walking from room to room (with or without device)? Independent  Stairs: Did the patient need assistance with internal or external stairs (with or without device)? Independent  Functional Cognition: Did the patient need help planning regular tasks such as shopping or remembering to take medications? Needed some help  Current Functional Level Cognition  Overall Cognitive Status: Impaired/Different from baseline Difficult to assess due to: (PMV in place.  ) Current Attention Level: Sustained Orientation Level: Oriented X4(intermittent confusion, delerius) Following Commands: Follows one step commands consistently Safety/Judgement: Decreased awareness of safety, Decreased awareness of deficits General Comments: tangential at times; talking about doing     Extremity Assessment (includes Sensation/Coordination)  Upper Extremity Assessment: RUE deficits/detail, LUE deficits/detail RUE Deficits / Details: longstanding shoulder limitations, generalized weakness RUE Coordination: decreased gross motor LUE Deficits / Details: longstanding shoulder limitations, generalized weakness LUE Coordination: decreased gross motor  Lower Extremity Assessment: Generalized weakness    ADLs  Overall ADL's : Needs assistance/impaired Eating/Feeding: Moderate  assistance Eating/Feeding Details (indicate cue type and reason): pudding thick liquids 2/12 Grooming: Sitting, Wash/dry hands, Minimal assistance, Set up, Brushing hair, Wash/dry face, Cueing for sequencing Grooming Details (indicate cue type and reason): Pt required vc to brush back of head, cues for sternal precautions (tub terminology used) Upper Body Bathing: Maximal assistance, Sitting Lower Body Bathing: Total assistance, Bed level Upper Body Dressing : Moderate assistance Upper Body Dressing Details (indicate cue type and reason): Pt assisted by lifting BUE to put through armholes Lower Body Dressing: Maximal assistance Lower Body Dressing Details (indicate cue type and reason): to doff socks Toilet Transfer: Maximal assistance, +2 for physical assistance, +2 for safety/equipment, Squat-pivot(2 HHA) Toilet Transfer Details (indicate cue type and reason): vc with sit <>stand to push off legs, improved scoot ability this session Toileting- Clothing Manipulation and Hygiene: Total assistance Toileting - Clothing Manipulation Details (indicate cue type and reason): Pt able to stand with +2 A while pericare was completed Functional mobility during ADLs: Maximal assistance, +2 for physical assistance, Cueing for sequencing, Cueing for safety(EVA walker) General ADL Comments: Pt continues with poor insight into eating restrictions "the MD told me I can eat icecream and drink coke" when this is not the case.    Mobility  Overal bed mobility: Needs Assistance Bed Mobility: Supine to Sit, Sit to Supine, Rolling Rolling: Min assist, +2 for physical assistance Sidelying to sit: Max assist, +2 for physical assistance, HOB elevated Supine to sit: Min assist, HOB elevated Sit to supine: Mod assist, +2 for physical assistance Sit to sidelying: Max assist, +2 for physical assistance General bed mobility comments: Cues for hand placement to avoid pulling.  Pt required assistance to lower trunk and  lift B LEs into bed.  Transfers  Overall transfer level: Needs assistance Equipment used: 4-wheeled walker(EVA walker) Transfers: Sit to/from Stand, Stand Pivot Transfers Sit to Stand: Max assist, +2 physical assistance Stand pivot transfers: +2 physical assistance, Max assist Squat pivot transfers: Max assist, +2 safety/equipment, +2 physical assistance General transfer comment: Cues for hand placement on knees to and from seated surface to maintain sternal precautions.  Pt followed commands better.  Pt required cues for hip extension and anterior translation into standing.  Pt walks B feet forward and flexes B hips.  Pt unable to correct with VCs and requires tactile cues to facilitate extension.      Ambulation / Gait / Stairs / Wheelchair Mobility  Ambulation/Gait Ambulation/Gait assistance: Max assist, +2 physical assistance Ambulation Distance (Feet): 34 Feet Assistive device: 4-wheeled walker(EVA) Gait Pattern/deviations: Shuffle, Trunk flexed, Decreased stride length, Leaning posteriorly, Narrow base of support, Scissoring General Gait Details: Unsteady ambulation using Eva walker and maxA+2 to maintain balance and trunk elevation, and control walker. Pt with significant posterior lean, with feet too far forward and increased difficulty translating her trunk forward despite multimodal cues. Pt presents with scissoring narrow BOS and cues to increase BOS.   Gait velocity: Decreased Gait velocity interpretation: Below normal speed for age/gender    Posture / Balance Dynamic Sitting Balance Sitting balance - Comments: Pt remains to push posterior when scooting to edge of bed.  Pt required cues for anterior positioning of pelvis to improve sitting posture.   Balance Overall balance assessment: Needs assistance Sitting-balance support: Feet supported, Bilateral upper extremity supported Sitting balance-Leahy Scale: Fair Sitting balance - Comments: Pt remains to push posterior when  scooting to edge of bed.  Pt required cues for anterior positioning of pelvis to improve sitting posture.   Postural control: Posterior lean Standing balance-Leahy Scale: Poor Standing balance comment: posterior lean, forward placement of B feet and resistant to facilitation to correct.      Special needs/care consideration BiPAP/CPAP: No CPM: No Continuous Drip IV: No Dialysis: No         Life Vest: No Oxygen: Yes 28% FiO2 via trach collar  Special Bed: ICU bed Trach Size: 4 cuffless Wound Vac (area): No       Skin: Dry, Abrasions to right shin, Ecchymosis to bilateral upper and lower extremities as well as abdomen with small skin tears to extremities as well Bowel mgmt: Continent, last BM 08/09/17 and monitoring for constipation due to iron supplements  Bladder mgmt: External catheter; however, patient reports that she knows when she needs to go  Diabetic mgmt: None PTA      Previous Home Environment Living Arrangements: Spouse/significant other Available Help at Discharge: Family, Available 24 hours/day Type of Home: Mobile home Home Layout: One level Home Access: Stairs to enter Entrance Stairs-Rails: Right, Left, Can reach both Entrance Stairs-Number of Steps: 4 Bathroom Shower/Tub: Multimedia programmer: Handicapped height [place]: No Additional Comments: ? may have 02 in home  Discharge Living Setting Plans for Discharge Living Setting: Patient's home, Lives with (comment)(Spouse) Type of Home at Discharge: Mobile home Discharge Home Layout: One level Discharge Home Access: Stairs to enter Entrance Stairs-Rails: Can reach both Entrance Stairs-Number of Steps: 4 Discharge Bathroom Shower/Tub: Walk-in shower Discharge Bathroom Toilet: Handicapped height Discharge Bathroom Accessibility: Yes How Accessible: Accessible via walker(sideways through doorway) Does the patient have any problems obtaining your medications?: No  Social/Family/Support  Systems Patient Roles: Spouse Contact Information: Margarette Asal [phone], cell [phone] Anticipated Caregiver: Spouse and others as needed  Anticipated Caregiver's Contact Information: see above  Ability/Limitations of Caregiver: None Caregiver Availability: 24/7 Discharge Plan Discussed with Primary Caregiver: Yes Is Caregiver In Agreement with Plan?: Yes Does Caregiver/Family have Issues with Lodging/Transportation while Pt is in Rehab?: No   Goals/Additional Needs Patient/Family Goal for Rehab: PT/OT/SLP: Min-Mod A  Expected length of stay: 2-3 weeks  Cultural Considerations: [employer]  Dietary Needs: Dys.1 textures with pudding-thick liquids  Equipment Needs: TBD Pt/Family Agrees to Admission and willing to participate: Yes Program Orientation Provided & Reviewed with Pt/Caregiver Including Roles  & Responsibilities: Yes  Barriers to Discharge: Medical stability, Trach, Nutrition means  Decrease burden of Care through IP rehab admission: No  Possible need for SNF placement upon discharge: No  Patient Condition: This patient's medical and functional status has changed since the consult dated: 08/11/17 in which the Rehabilitation Physician determined and documented that the patient's condition is appropriate for intensive rehabilitative care in an inpatient rehabilitation facility. See "History of Present Illness" (above) for medical update. Functional changes are: Max A +2 transfers and gait with the EVA walker. Patient's medical and functional status update has been discussed with the Rehabilitation physician and patient remains appropriate for inpatient rehabilitation. Will admit to inpatient rehab tomorrow.  Preadmission Screen Completed By:  Gunnar Fusi, 08/11/2017 4:01 PM ______________________________________________________________________   Discussed status with Dr. Naaman Plummer on 2/15//19 at 1612 and received telephon/ approval for admission today.  Admission  Coordinator:  Gunnar Fusi, time 1612/Date 08/11/17

## 2017-08-11 NOTE — Progress Notes (Signed)
.  [facility] ADULT ICU REPLACEMENT PROTOCOL FOR AM LAB REPLACEMENT ONLY  The patient does apply for the [facility] Adult ICU Electrolyte Replacment Protocol based on the criteria listed below:   1. Is GFR >/= 40 ml/min? Yes.    Patient's GFR today is >60 2. Is urine output >/= 0.5 ml/kg/hr for the last 6 hours? Yes.   Patient's UOP is 1.2 ml/kg/hr 3. Is BUN < 60 mg/dL? Yes.    Patient's BUN today is 35 4. Abnormal electrolyte(s): K-3.3 5. Ordered repletion with: per protocol 6. If a panic level lab has been reported, has the CCM MD in charge been notified? Yes.  .   Physician:  Dr. Cleon Gustin, Philis Nettle 08/11/2017 7:13 AM

## 2017-08-11 NOTE — Progress Notes (Signed)
Inpatient Rehabilitation  I have prepared patient for an IP Rehab admission.  Plan to proceed with IP Rehab admission Saturday 08/12/17.  Please plan for Dr. Naaman Plummer to clear and admit patient tomorrow.  Call [phone] if questions.   Carmelia Roller., CCC/SLP Admission Coordinator  [place]  Cell [phone]

## 2017-08-11 NOTE — Progress Notes (Signed)
..  Name: [name] MRN: [id number] DOB: [birth year]    ADMISSION DATE:  07/10/2017  CONSULTATION DATE:  07/12/17  REFERRING MD :  Nils Pyle MD   BRIEF:   77 year old female with past medical history significant for fusiform ascending aortic aneurysm, hypertension, hyperlipidemia, status post AVR and replacement of ascending aortic aneurysm.  On 07/10/2017 postprocedure patient developed acute cardiogenic shock had an episode of V. fib arrest was taken back to the OR on 07/11/2017 for evacuation of hematoma and placement of RVAD. She had prolonged mechanical ventilation requiring tracheostomy.  SUBJECTIVE:   No issues overnight.  Tolerating PMV  VITAL SIGNS: Temp:  [97.8 F (36.6 C)-98.4 F (36.9 C)] 98 F (36.7 C) (02/15 0756) Pulse Rate:  [68-93] 87 (02/15 1100) Resp:  [13-25] 20 (02/15 1100) BP: (94-138)/(48-100) 122/67 (02/15 1100) SpO2:  [85 %-100 %] 98 % (02/15 1100) FiO2 (%):  [28 %] 28 % (02/15 0805) Weight:  [164 lb 3.9 oz (74.5 kg)] 164 lb 3.9 oz (74.5 kg) (02/15 0500)   Filed Weights   08/08/17 0500 08/09/17 0500 08/11/17 0500  Weight: 166 lb 10.7 oz (75.6 kg) 161 lb 2.5 oz (73.1 kg) 164 lb 3.9 oz (74.5 kg)    Intake/Output Summary (Last 24 hours) at 08/11/2017 1131 Last data filed at 08/11/2017 1100 Gross per 24 hour  Intake 1360 ml  Output 1000 ml  Net 360 ml   PHYSICAL EXAMINATION: Gen:      No acute distress HEENT:  EOMI, sclera anicteric Neck:     No masses; no thyromegaly, trach site clean, dry. PMV in place Lungs:    Clear to auscultation bilaterally; normal respiratory effort CV:         Regular rate and rhythm; no murmurs Abd:      + bowel sounds; soft, non-tender; no palpable masses, no distension Ext:    No edema; adequate peripheral perfusion Skin:      Warm and dry; no rash Neuro: alert and oriented x 3  PULMONARY  CBC Recent Labs  Lab 08/05/17 0405 08/07/17 0325 08/11/17 0346  HGB 12.0 12.5 11.4*  HCT 40.3 41.2 38.5  WBC 11.0* 9.6  7.1  PLT 324 277 212   COAGULATION No results for input(s): INR in the last 168 hours.  CARDIAC  No results for input(s): TROPONINI in the last 168 hours. No results for input(s): PROBNP in the last 168 hours.  CHEMISTRY Recent Labs  Lab 08/07/17 0325 08/08/17 0334 08/09/17 0302 08/10/17 0415 08/11/17 0346  NA 145 145 146* 145 145  K 3.4* 3.4* 3.1* 3.4* 3.3*  CL 105 105 106 102 103  CO2 27 27 28 31 30   GLUCOSE 114* 107* 106* 105* 117*  BUN 41* 39* 36* 35* 35*  CREATININE 0.66 0.59 0.58 0.58 0.54  CALCIUM 7.5* 7.5* 7.4* 7.7* 7.7*  MG  --   --  1.9  --   --   PHOS  --   --  3.1  --   --    Estimated Creatinine Clearance: 61.4 mL/min (by C-G formula based on SCr of 0.54 mg/dL).  LIVER No results for input(s): AST, ALT, ALKPHOS, BILITOT, PROT, ALBUMIN, INR in the last 168 hours.  INFECTIOUS No results for input(s): LATICACIDVEN, PROCALCITON in the last 168 hours.  ENDOCRINE CBG (last 3)  Recent Labs    08/10/17 2326 08/11/17 0359 08/11/17 0754  GLUCAP 96 105* 117*   IMAGING  Dg Chest Port 1 View  Result Date: 08/10/2017 CLINICAL  DATA:  Recent aortic valve replacement. Chronic bronchitis. EXAM: PORTABLE CHEST 1 VIEW COMPARISON:  August 08, 2017 FINDINGS: Tracheostomy catheter tip is 6.4 cm above the carina. Central catheter tip is at the cavoatrial junction. Feeding tube tip is below the diaphragm, likely in the duodenum. No pneumothorax. There is atelectatic change in the left base. The lungs elsewhere are clear. Heart is borderline enlarged, stable. Pulmonary vascularity is normal. No adenopathy. Patient is status post aortic valve replacement. There is postoperative change in the proximal left humerus as well as in the right axillary region. There are also surgical clips in the region of the thyroid. IMPRESSION: Tube and catheter positions as described without pneumothorax. Left base atelectasis. Lungs elsewhere clear. Stable cardiac silhouette. Areas of postoperative  change at several sites, stable. Electronically Signed   By: Lowella Grip III M.D.   On: 08/10/2017 09:00   STUDIES: Echo12/12/2016 LVEF 55-60%, Grade 1 DD, Mild/Mod AI with LVOT, Mid ascending aortic diameter 48.88 mm, Mild LAE, RV normal Echo (07/22/17): EF 65-70%, bioprosthetic aortic valve functioning normally, PASP 33 mmHg, RV looks ok.   CULTURES 1/24 sputum >> oral flora  ANTIBIOTICS Ceftaz1/17 >> 1.20 Cefuroxime 1/21 >> 1/23 Ceftaz 1/23 >> 1/31  EVENTS 1/14  Admit 1/14 S/p AVR and ascending AA replacement  1/14 VF Arrest in evening with shock x 1 1/15 to OR for evacuation of hematoma and RVAD placement  1/16 PCCM consult 1/19 Lasix per cardiology,  CVP of 5 1/21 RVAD removed 1/22 heavily diuresed  1/23 ready for extubation. Atrial fib amio started.  1/24 reintubated 1/25  Afebrile 1/29  trach placed 2/01  Tolerating PMV with SLP  2/12 tolerated downsize to 4 cuffless  ASSESSMENT / PLAN: 77 year old female with PMHx significant for fusiform ascending aortic aneurysm, hypertension, hyperlipidemia, status post AVR and replacement of ascending aortic aneurysm.  On 07/10/2017 postprocedure patient developed acute cardiogenic shock had an episode of V. fib arrest was taken back to the OR on 07/11/2017 for evacuation of hematoma and placement of RVAD.  RVAD now removed, failed extubation 1/24 but continued to wean well. Underwent tracheostomy 1/29.   Tolerated downsize to 4 cuffless trach 2/12.  2/13 attempted trach cap but did not tolerate, placed back on 28% FiO2.  Tolerating PMV well.  ASSESSMENT / PLAN: Chronic hypoxic respiratory failure - s/p trach placement 1/29.  Downsized to 4 cuffless 2/12 but did not tolerate capping trial 2/13. - Continue PMV, did not tolerate capping.  ENT called 2/14 - Bronchial hygiene - CXR intermittently  Dysphagia. - Per speech  Rest per primary team.   Patient is due to get transferred to CIR soon PCCM will follow for trach  management.  Marshell Garfinkel MD  Pulmonary and Critical Care Pager [phone] If no answer or after 3pm call: [phone] 08/11/2017, 11:35 AM

## 2017-08-12 ENCOUNTER — Other Ambulatory Visit: Payer: Self-pay

## 2017-08-12 ENCOUNTER — Encounter (HOSPITAL_COMMUNITY): Payer: Self-pay

## 2017-08-12 ENCOUNTER — Inpatient Hospital Stay (HOSPITAL_COMMUNITY)
Admission: RE | Admit: 2017-08-12 | Discharge: 2017-09-02 | DRG: 945 | Disposition: A | Discharge status: Home Health | Payer: Medicare Other | Source: Intra-hospital | Attending: Physical Medicine & Rehabilitation | Admitting: Physical Medicine & Rehabilitation

## 2017-08-12 DIAGNOSIS — I110 Hypertensive heart disease with heart failure: Secondary | ICD-10-CM | POA: Diagnosis not present

## 2017-08-12 DIAGNOSIS — R251 Tremor, unspecified: Secondary | ICD-10-CM | POA: Diagnosis present

## 2017-08-12 DIAGNOSIS — F329 Major depressive disorder, single episode, unspecified: Secondary | ICD-10-CM | POA: Diagnosis present

## 2017-08-12 DIAGNOSIS — I5031 Acute diastolic (congestive) heart failure: Secondary | ICD-10-CM | POA: Diagnosis not present

## 2017-08-12 DIAGNOSIS — R739 Hyperglycemia, unspecified: Secondary | ICD-10-CM | POA: Diagnosis not present

## 2017-08-12 DIAGNOSIS — Z930 Tracheostomy status: Secondary | ICD-10-CM | POA: Diagnosis not present

## 2017-08-12 DIAGNOSIS — I4891 Unspecified atrial fibrillation: Secondary | ICD-10-CM | POA: Diagnosis present

## 2017-08-12 DIAGNOSIS — K219 Gastro-esophageal reflux disease without esophagitis: Secondary | ICD-10-CM | POA: Diagnosis not present

## 2017-08-12 DIAGNOSIS — R1310 Dysphagia, unspecified: Secondary | ICD-10-CM

## 2017-08-12 DIAGNOSIS — R1312 Dysphagia, oropharyngeal phase: Secondary | ICD-10-CM | POA: Diagnosis not present

## 2017-08-12 DIAGNOSIS — R531 Weakness: Secondary | ICD-10-CM | POA: Diagnosis present

## 2017-08-12 DIAGNOSIS — Z881 Allergy status to other antibiotic agents status: Secondary | ICD-10-CM

## 2017-08-12 DIAGNOSIS — H409 Unspecified glaucoma: Secondary | ICD-10-CM | POA: Diagnosis present

## 2017-08-12 DIAGNOSIS — R609 Edema, unspecified: Secondary | ICD-10-CM | POA: Diagnosis not present

## 2017-08-12 DIAGNOSIS — Z8249 Family history of ischemic heart disease and other diseases of the circulatory system: Secondary | ICD-10-CM | POA: Diagnosis not present

## 2017-08-12 DIAGNOSIS — E890 Postprocedural hypothyroidism: Secondary | ICD-10-CM | POA: Diagnosis not present

## 2017-08-12 DIAGNOSIS — F411 Generalized anxiety disorder: Secondary | ICD-10-CM | POA: Diagnosis not present

## 2017-08-12 DIAGNOSIS — M797 Fibromyalgia: Secondary | ICD-10-CM | POA: Diagnosis not present

## 2017-08-12 DIAGNOSIS — Z79899 Other long term (current) drug therapy: Secondary | ICD-10-CM

## 2017-08-12 DIAGNOSIS — Z7982 Long term (current) use of aspirin: Secondary | ICD-10-CM

## 2017-08-12 DIAGNOSIS — Z79891 Long term (current) use of opiate analgesic: Secondary | ICD-10-CM

## 2017-08-12 DIAGNOSIS — R05 Cough: Secondary | ICD-10-CM | POA: Diagnosis not present

## 2017-08-12 DIAGNOSIS — Z952 Presence of prosthetic heart valve: Secondary | ICD-10-CM | POA: Diagnosis not present

## 2017-08-12 DIAGNOSIS — I10 Essential (primary) hypertension: Secondary | ICD-10-CM | POA: Diagnosis not present

## 2017-08-12 DIAGNOSIS — E876 Hypokalemia: Secondary | ICD-10-CM | POA: Diagnosis not present

## 2017-08-12 DIAGNOSIS — F32A Depression, unspecified: Secondary | ICD-10-CM

## 2017-08-12 DIAGNOSIS — Z9049 Acquired absence of other specified parts of digestive tract: Secondary | ICD-10-CM | POA: Diagnosis not present

## 2017-08-12 DIAGNOSIS — I480 Paroxysmal atrial fibrillation: Secondary | ICD-10-CM | POA: Diagnosis present

## 2017-08-12 DIAGNOSIS — Z85850 Personal history of malignant neoplasm of thyroid: Secondary | ICD-10-CM | POA: Diagnosis not present

## 2017-08-12 DIAGNOSIS — G4700 Insomnia, unspecified: Secondary | ICD-10-CM | POA: Diagnosis present

## 2017-08-12 DIAGNOSIS — Z888 Allergy status to other drugs, medicaments and biological substances status: Secondary | ICD-10-CM | POA: Diagnosis not present

## 2017-08-12 DIAGNOSIS — Z96651 Presence of right artificial knee joint: Secondary | ICD-10-CM | POA: Diagnosis present

## 2017-08-12 DIAGNOSIS — R5381 Other malaise: Secondary | ICD-10-CM | POA: Diagnosis not present

## 2017-08-12 DIAGNOSIS — E785 Hyperlipidemia, unspecified: Secondary | ICD-10-CM | POA: Diagnosis not present

## 2017-08-12 DIAGNOSIS — F419 Anxiety disorder, unspecified: Secondary | ICD-10-CM | POA: Diagnosis not present

## 2017-08-12 DIAGNOSIS — Z91040 Latex allergy status: Secondary | ICD-10-CM

## 2017-08-12 DIAGNOSIS — K5900 Constipation, unspecified: Secondary | ICD-10-CM | POA: Diagnosis present

## 2017-08-12 DIAGNOSIS — L8990 Pressure ulcer of unspecified site, unspecified stage: Secondary | ICD-10-CM | POA: Diagnosis present

## 2017-08-12 DIAGNOSIS — T501X5A Adverse effect of loop [high-ceiling] diuretics, initial encounter: Secondary | ICD-10-CM | POA: Diagnosis not present

## 2017-08-12 DIAGNOSIS — R053 Chronic cough: Secondary | ICD-10-CM

## 2017-08-12 DIAGNOSIS — I5030 Unspecified diastolic (congestive) heart failure: Secondary | ICD-10-CM

## 2017-08-12 DIAGNOSIS — Z7951 Long term (current) use of inhaled steroids: Secondary | ICD-10-CM

## 2017-08-12 DIAGNOSIS — Z79890 Hormone replacement therapy (postmenopausal): Secondary | ICD-10-CM

## 2017-08-12 LAB — BASIC METABOLIC PANEL
Anion gap: 13 (ref 5–15)
Anion gap: 11 (ref 5–15)
BUN: 37 mg/dL — AB (ref 6–20)
BUN: 34 mg/dL — AB (ref 6–20)
CALCIUM: 7.9 mg/dL — AB (ref 8.9–10.3)
CHLORIDE: 101 mmol/L (ref 101–111)
CO2: 30 mmol/L (ref 22–32)
CO2: 30 mmol/L (ref 22–32)
Calcium: 8 mg/dL — ABNORMAL LOW (ref 8.9–10.3)
Chloride: 101 mmol/L (ref 101–111)
Creatinine, Ser: 0.55 mg/dL (ref 0.44–1.00)
Creatinine, Ser: 0.56 mg/dL (ref 0.44–1.00)
GFR calc Af Amer: >60 mL/min (ref >60)
GFR calc Af Amer: >60 mL/min (ref >60)
GFR calc non Af Amer: >60 mL/min (ref >60)
GFR calc non Af Amer: >60 mL/min (ref >60)
GLUCOSE: 96 mg/dL (ref 65–99)
GLUCOSE: 113 mg/dL — AB (ref 65–99)
POTASSIUM: 3.4 mmol/L — AB (ref 3.5–5.1)
POTASSIUM: 3.7 mmol/L (ref 3.5–5.1)
SODIUM: 142 mmol/L (ref 135–145)
Sodium: 144 mmol/L (ref 135–145)

## 2017-08-12 LAB — CBC
HEMATOCRIT: 41.2 % (ref 36.0–46.0)
Hemoglobin: 12.7 g/dL (ref 12.0–15.0)
MCH: 30.6 pg (ref 26.0–34.0)
MCHC: 30.8 g/dL (ref 30.0–36.0)
MCV: 99.3 fL (ref 78.0–100.0)
Platelets: 246 10*3/uL (ref 150–400)
RBC: 4.15 MIL/uL (ref 3.87–5.11)
RDW: 16.3 % — AB (ref 11.5–15.5)
WBC: 8.9 10*3/uL (ref 4.0–10.5)

## 2017-08-12 LAB — GLUCOSE, CAPILLARY
Glucose-Capillary: 183 mg/dL — ABNORMAL HIGH (ref 65–99)
Glucose-Capillary: 80 mg/dL (ref 65–99)
Glucose-Capillary: 85 mg/dL (ref 65–99)
Glucose-Capillary: 89 mg/dL (ref 65–99)
Glucose-Capillary: 96 mg/dL (ref 65–99)
Glucose-Capillary: 97 mg/dL (ref 65–99)

## 2017-08-12 MED ORDER — FUROSEMIDE 40 MG PO TABS
40.0000 mg | ORAL_TABLET | Freq: Every day | ORAL | Status: DC
  Administered 2017-08-13 – 2017-09-02 (×21): 40 mg via ORAL
  Filled 2017-08-12 (×22): qty 1

## 2017-08-12 MED ORDER — ASPIRIN EC 325 MG PO TBEC
325.0000 mg | DELAYED_RELEASE_TABLET | Freq: Every day | ORAL | Status: DC
  Administered 2017-08-17 – 2017-09-02 (×13): 325 mg via ORAL
  Filled 2017-08-12 (×18): qty 1

## 2017-08-12 MED ORDER — POTASSIUM CHLORIDE 20 MEQ/15ML (10%) PO SOLN
20.0000 meq | Freq: Once | ORAL | Status: AC
  Administered 2017-08-12: 20 meq via ORAL
  Filled 2017-08-12: qty 15

## 2017-08-12 MED ORDER — NAPHAZOLINE-GLYCERIN 0.012-0.2 % OP SOLN
1.0000 [drp] | Freq: Two times a day (BID) | OPHTHALMIC | Status: DC
  Administered 2017-08-13: 2 [drp] via OPHTHALMIC
  Administered 2017-08-13 – 2017-08-15 (×5): 1 [drp] via OPHTHALMIC
  Administered 2017-08-16 (×2): 2 [drp] via OPHTHALMIC
  Administered 2017-08-17: 1 [drp] via OPHTHALMIC
  Administered 2017-08-17: 2 [drp] via OPHTHALMIC
  Administered 2017-08-17 – 2017-08-18 (×2): 1 [drp] via OPHTHALMIC
  Administered 2017-08-18 – 2017-08-20 (×5): 2 [drp] via OPHTHALMIC
  Administered 2017-08-21 – 2017-08-23 (×4): 1 [drp] via OPHTHALMIC
  Administered 2017-08-23 – 2017-08-27 (×9): 2 [drp] via OPHTHALMIC
  Administered 2017-08-28 – 2017-09-02 (×8): 1 [drp] via OPHTHALMIC
  Filled 2017-08-12: qty 15

## 2017-08-12 MED ORDER — LEVALBUTEROL HCL 0.63 MG/3ML IN NEBU
0.6300 mg | INHALATION_SOLUTION | Freq: Two times a day (BID) | RESPIRATORY_TRACT | Status: DC
  Administered 2017-08-12 – 2017-08-14 (×4): 0.63 mg via RESPIRATORY_TRACT
  Filled 2017-08-12 (×4): qty 3

## 2017-08-12 MED ORDER — ORAL CARE MOUTH RINSE: 15.0000 mL | Freq: Two times a day (BID) | OROMUCOSAL | 0 refills | Status: DC

## 2017-08-12 MED ORDER — AMIODARONE HCL 200 MG PO TABS: 200.0000 mg | ORAL_TABLET | Freq: Every day | ORAL | Status: DC

## 2017-08-12 MED ORDER — PANTOPRAZOLE SODIUM 40 MG PO PACK: 40.0000 mg | PACK | Freq: Two times a day (BID) | ORAL | Status: DC

## 2017-08-12 MED ORDER — RESOURCE THICKENUP CLEAR PO POWD
ORAL | Status: DC | PRN
  Filled 2017-08-12: qty 125

## 2017-08-12 MED ORDER — VENLAFAXINE HCL 75 MG PO TABS
37.5000 mg | ORAL_TABLET | Freq: Two times a day (BID) | ORAL | Status: DC
  Administered 2017-08-13 – 2017-09-02 (×41): 37.5 mg via ORAL
  Filled 2017-08-12 (×43): qty 1

## 2017-08-12 MED ORDER — CHLORHEXIDINE GLUCONATE 0.12 % MT SOLN: 15.0000 mL | Freq: Two times a day (BID) | OROMUCOSAL | 0 refills | Status: DC

## 2017-08-12 MED ORDER — ASPIRIN 81 MG PO CHEW
324.0000 mg | CHEWABLE_TABLET | Freq: Every day | ORAL | Status: DC
  Administered 2017-08-13 – 2017-08-30 (×9): 324 mg
  Filled 2017-08-12 (×14): qty 4

## 2017-08-12 MED ORDER — SPIRONOLACTONE 25 MG PO TABS
25.0000 mg | ORAL_TABLET | Freq: Every day | ORAL | Status: DC
  Administered 2017-08-13 – 2017-09-02 (×21): 25 mg via ORAL
  Filled 2017-08-12 (×22): qty 1

## 2017-08-12 MED ORDER — VENLAFAXINE HCL 37.5 MG PO TABS: 37.5000 mg | ORAL_TABLET | Freq: Two times a day (BID) | ORAL | Status: DC

## 2017-08-12 MED ORDER — ENOXAPARIN SODIUM 40 MG/0.4ML ~~LOC~~ SOLN: 40.0000 mg | SUBCUTANEOUS | Status: DC

## 2017-08-12 MED ORDER — BUDESONIDE 0.5 MG/2ML IN SUSP: 0.5000 mg | Freq: Two times a day (BID) | RESPIRATORY_TRACT | 12 refills | Status: DC

## 2017-08-12 MED ORDER — VITAL 1.5 CAL PO LIQD: 1000.0000 mL | ORAL | Status: DC

## 2017-08-12 MED ORDER — BUDESONIDE 0.5 MG/2ML IN SUSP
0.5000 mg | Freq: Two times a day (BID) | RESPIRATORY_TRACT | Status: DC
  Administered 2017-08-12 – 2017-09-02 (×42): 0.5 mg via RESPIRATORY_TRACT
  Filled 2017-08-12 (×44): qty 2

## 2017-08-12 MED ORDER — RESOURCE THICKENUP CLEAR PO POWD: ORAL | Status: DC

## 2017-08-12 MED ORDER — VITAL 1.5 CAL PO LIQD
1000.0000 mL | ORAL | Status: DC
  Administered 2017-08-13: 1000 mL
  Filled 2017-08-12: qty 1000

## 2017-08-12 MED ORDER — PRO-STAT SUGAR FREE PO LIQD: 30.0000 mL | Freq: Three times a day (TID) | ORAL | 0 refills | Status: DC

## 2017-08-12 MED ORDER — PRO-STAT SUGAR FREE PO LIQD
30.0000 mL | Freq: Three times a day (TID) | ORAL | Status: DC
  Administered 2017-08-12 – 2017-08-14 (×6): 30 mL
  Filled 2017-08-12 (×6): qty 30

## 2017-08-12 MED ORDER — ARFORMOTEROL TARTRATE 15 MCG/2ML IN NEBU: 15.0000 ug | INHALATION_SOLUTION | Freq: Two times a day (BID) | RESPIRATORY_TRACT | Status: DC

## 2017-08-12 MED ORDER — AMIODARONE HCL 200 MG PO TABS
200.0000 mg | ORAL_TABLET | Freq: Every day | ORAL | Status: DC
  Administered 2017-08-13 – 2017-09-02 (×21): 200 mg via ORAL
  Filled 2017-08-12 (×22): qty 1

## 2017-08-12 MED ORDER — INSULIN DETEMIR 100 UNIT/ML ~~LOC~~ SOLN
10.0000 [IU] | Freq: Two times a day (BID) | SUBCUTANEOUS | Status: DC
  Administered 2017-08-12 – 2017-08-16 (×7): 10 [IU] via SUBCUTANEOUS
  Filled 2017-08-12 (×8): qty 0.1

## 2017-08-12 MED ORDER — POTASSIUM CHLORIDE 20 MEQ PO PACK
20.0000 meq | PACK | Freq: Once | ORAL | Status: DC
  Filled 2017-08-12: qty 1

## 2017-08-12 MED ORDER — INSULIN ASPART 100 UNIT/ML ~~LOC~~ SOLN: 0.0000 [IU] | SUBCUTANEOUS | 11 refills | Status: DC

## 2017-08-12 MED ORDER — SPIRONOLACTONE 25 MG PO TABS: 25.0000 mg | ORAL_TABLET | Freq: Every day | ORAL | Status: DC

## 2017-08-12 MED ORDER — LEVOTHYROXINE SODIUM 100 MCG PO TABS: 100.0000 ug | ORAL_TABLET | Freq: Every day | ORAL | Status: DC

## 2017-08-12 MED ORDER — LEVOTHYROXINE SODIUM 100 MCG PO TABS
100.0000 ug | ORAL_TABLET | Freq: Every day | ORAL | Status: DC
  Administered 2017-08-13 – 2017-09-02 (×21): 100 ug via ORAL
  Filled 2017-08-12 (×21): qty 1

## 2017-08-12 MED ORDER — CLONAZEPAM 0.5 MG PO TBDP
1.0000 mg | ORAL_TABLET | Freq: Every day | ORAL | Status: DC
  Administered 2017-08-12 – 2017-08-26 (×15): 1 mg via ORAL
  Filled 2017-08-12 (×15): qty 2

## 2017-08-12 MED ORDER — SORBITOL 70 % SOLN
30.0000 mL | Freq: Every day | Status: DC | PRN
  Administered 2017-08-13 – 2017-08-30 (×5): 30 mL via ORAL
  Filled 2017-08-12 (×5): qty 30

## 2017-08-12 MED ORDER — LATANOPROST 0.005 % OP SOLN
1.0000 [drp] | Freq: Every day | OPHTHALMIC | Status: DC
  Administered 2017-08-12 – 2017-09-01 (×21): 1 [drp] via OPHTHALMIC
  Filled 2017-08-12: qty 2.5

## 2017-08-12 MED ORDER — LEVALBUTEROL HCL 0.63 MG/3ML IN NEBU
0.6300 mg | INHALATION_SOLUTION | Freq: Four times a day (QID) | RESPIRATORY_TRACT | Status: DC | PRN
  Administered 2017-08-14 – 2017-08-28 (×13): 0.63 mg via RESPIRATORY_TRACT
  Filled 2017-08-12 (×15): qty 3

## 2017-08-12 MED ORDER — LEVALBUTEROL HCL 0.63 MG/3ML IN NEBU: 0.6300 mg | INHALATION_SOLUTION | Freq: Two times a day (BID) | RESPIRATORY_TRACT | 12 refills | Status: DC

## 2017-08-12 MED ORDER — GERHARDT'S BUTT CREAM
TOPICAL_CREAM | Freq: Two times a day (BID) | CUTANEOUS | Status: DC
  Administered 2017-08-12 – 2017-08-21 (×17): via TOPICAL
  Administered 2017-08-21: 1 via TOPICAL
  Administered 2017-08-22 – 2017-09-01 (×21): via TOPICAL
  Filled 2017-08-12 (×2): qty 1

## 2017-08-12 MED ORDER — ARFORMOTEROL TARTRATE 15 MCG/2ML IN NEBU
15.0000 ug | INHALATION_SOLUTION | Freq: Two times a day (BID) | RESPIRATORY_TRACT | Status: DC
  Administered 2017-08-12 – 2017-09-02 (×42): 15 ug via RESPIRATORY_TRACT
  Filled 2017-08-12 (×44): qty 2

## 2017-08-12 MED ORDER — NAPHAZOLINE-GLYCERIN 0.012-0.2 % OP SOLN: 1.0000 [drp] | Freq: Two times a day (BID) | OPHTHALMIC | 0 refills | Status: DC

## 2017-08-12 MED ORDER — ENOXAPARIN SODIUM 40 MG/0.4ML ~~LOC~~ SOLN
40.0000 mg | SUBCUTANEOUS | Status: DC
  Administered 2017-08-13 – 2017-09-01 (×20): 40 mg via SUBCUTANEOUS
  Filled 2017-08-12 (×21): qty 0.4

## 2017-08-12 MED ORDER — QUETIAPINE FUMARATE 25 MG PO TABS: 75.0000 mg | ORAL_TABLET | Freq: Every day | ORAL | Status: DC

## 2017-08-12 MED ORDER — QUETIAPINE FUMARATE 50 MG PO TABS
75.0000 mg | ORAL_TABLET | Freq: Every day | ORAL | Status: DC
  Administered 2017-08-12 – 2017-09-01 (×21): 75 mg via ORAL
  Filled 2017-08-12 (×21): qty 1

## 2017-08-12 MED ORDER — ONDANSETRON HCL 4 MG PO TABS: 4.0000 mg | ORAL_TABLET | Freq: Four times a day (QID) | ORAL | Status: DC | PRN

## 2017-08-12 MED ORDER — INSULIN ASPART 100 UNIT/ML ~~LOC~~ SOLN
0.0000 [IU] | SUBCUTANEOUS | Status: DC
  Administered 2017-08-14: 2 [IU] via SUBCUTANEOUS

## 2017-08-12 MED ORDER — SENNOSIDES 8.8 MG/5ML PO SYRP
5.0000 mL | ORAL_SOLUTION | Freq: Two times a day (BID) | ORAL | Status: DC
  Administered 2017-08-12 – 2017-08-17 (×10): 5 mL via ORAL
  Filled 2017-08-12 (×11): qty 5

## 2017-08-12 MED ORDER — ACETAMINOPHEN 160 MG/5ML PO SOLN: 650.0000 mg | Freq: Four times a day (QID) | ORAL | Status: DC | PRN

## 2017-08-12 MED ORDER — LEVALBUTEROL HCL 0.63 MG/3ML IN NEBU: 0.6300 mg | INHALATION_SOLUTION | Freq: Four times a day (QID) | RESPIRATORY_TRACT | 12 refills | Status: DC | PRN

## 2017-08-12 MED ORDER — ACETAMINOPHEN 160 MG/5ML PO SOLN: 650.0000 mg | Freq: Four times a day (QID) | ORAL | 0 refills | Status: DC | PRN

## 2017-08-12 MED ORDER — SENNOSIDES 8.8 MG/5ML PO SYRP: 5.0000 mL | ORAL_SOLUTION | Freq: Two times a day (BID) | ORAL | 0 refills | Status: DC

## 2017-08-12 MED ORDER — LATANOPROST 0.005 % OP SOLN: 1.0000 [drp] | Freq: Every day | OPHTHALMIC | 12 refills | Status: DC

## 2017-08-12 MED ORDER — ONDANSETRON HCL 4 MG/2ML IJ SOLN: 4.0000 mg | Freq: Four times a day (QID) | INTRAMUSCULAR | Status: DC | PRN

## 2017-08-12 MED ORDER — FUROSEMIDE 40 MG PO TABS: 40.0000 mg | ORAL_TABLET | Freq: Every day | ORAL | Status: DC

## 2017-08-12 MED ORDER — INSULIN DETEMIR 100 UNIT/ML ~~LOC~~ SOLN: 10.0000 [IU] | Freq: Two times a day (BID) | SUBCUTANEOUS | 11 refills | Status: DC

## 2017-08-12 NOTE — Progress Notes (Addendum)
[address]       [place] [address]             [phone]      26 Days Post-Op Procedure(s) (LRB): REMOVAL OF RVAD WITH PUMP STANDBY (N/A) TRANSESOPHAGEAL ECHOCARDIOGRAM (TEE) (N/A) Subjective: Didn't sleep well but feeling ok  Objective: Vital signs in last 24 hours: Temp:  [97.9 F (36.6 C)-98.6 F (37 C)] 98.2 F (36.8 C) (02/16 1105) Pulse Rate:  [84-98] 89 (02/16 1114) Cardiac Rhythm: Normal sinus rhythm (02/16 0744) Resp:  [11-27] 19 (02/16 1114) BP: (56-131)/(46-83) 131/69 (02/16 1105) SpO2:  [97 %-100 %] 97 % (02/16 1114) FiO2 (%):  [21 %-28 %] 21 % (02/16 1114) Weight:  [162 lb 11.2 oz (73.8 kg)] 162 lb 11.2 oz (73.8 kg) (02/16 0449)  Hemodynamic parameters for last 24 hours:    Intake/Output from previous day: 02/15 0701 - 02/16 0700 In: 650 [P.O.:240; I.V.:10; NG/GT:300; IV Piggyback:100] Out: 0  Intake/Output this shift: Total I/O In: 485 [P.O.:5; NG/GT:480] Out: -   General appearance: alert, cooperative and no distress Heart: regular rate and rhythm Lungs: somewhat coarse in upper airways Abdomen: benign Extremities: no edema Wound: incis healing well  Lab Results: Recent Labs    08/11/17 0346  WBC 7.1  HGB 11.4*  HCT 38.5  PLT 212   BMET:  Recent Labs    08/11/17 0346 08/12/17 0500  NA 145 144  K 3.3* 3.4*  CL 103 101  CO2 30 30  GLUCOSE 117* 96  BUN 35* 37*  CREATININE 0.54 0.55  CALCIUM 7.7* 7.9*    PT/INR: No results for input(s): LABPROT, INR in the last 72 hours. ABG    Component Value Date/Time   PHART 7.502 (H) 08/03/2017 0347   HCO3 27.4 08/03/2017 0347   TCO2 28 08/03/2017 0347   ACIDBASEDEF 1.0 07/18/2017 0404   O2SAT 90.0 08/03/2017 0347   CBG (last 3)  Recent Labs    08/12/17 0824 08/12/17 1229 08/12/17 1246  GLUCAP 96 89 85    Meds Scheduled Meds: . amiodarone  200 mg Oral Daily  . arformoterol  15 mcg Nebulization BID  . aspirin EC  325 mg Oral Daily   Or  . aspirin   324 mg Per Tube Daily  . budesonide (PULMICORT) nebulizer solution  0.5 mg Nebulization BID  . chlorhexidine  15 mL Mouth Rinse BID  . clonazepam  1 mg Oral QHS  . enoxaparin (LOVENOX) injection  40 mg Subcutaneous Q24H  . feeding supplement (PRO-STAT SUGAR FREE 64)  30 mL Per Tube TID  . furosemide  40 mg Oral Daily  . Gerhardt's butt cream   Topical BID  . insulin aspart  0-24 Units Subcutaneous Q4H  . insulin detemir  10 Units Subcutaneous BID  . latanoprost  1 drop Both Eyes QHS  . levalbuterol  0.63 mg Nebulization BID  . levothyroxine  100 mcg Oral QAC breakfast  . mouth rinse  15 mL Mouth Rinse q12n4p  . naphazoline-glycerin  1-2 drop Both Eyes BID  . pantoprazole sodium  40 mg Per Tube BID  . QUEtiapine  75 mg Oral QHS  . sennosides  5 mL Oral BID  . sodium chloride flush  10-40 mL Intracatheter Q12H  . spironolactone  25 mg Oral Daily  . venlafaxine  37.5 mg Oral BID   Continuous Infusions: . sodium chloride 250 mL (07/22/17 1820)  . feeding supplement (VITAL 1.5 CAL) 1,000 mL (08/10/17  1900)  . lactated ringers Stopped (07/17/17 1200)   PRN Meds:.acetaminophen (TYLENOL) oral liquid 160 mg/5 mL, levalbuterol, metoprolol tartrate, ondansetron (ZOFRAN) IV, RESOURCE THICKENUP CLEAR  Xrays No results found.  Assessment/Plan: S/P Procedure(s) (LRB): REMOVAL OF RVAD WITH PUMP STANDBY (N/A) TRANSESOPHAGEAL ECHOCARDIOGRAM (TEE) (N/A)  1 stable, hopefully tx to CIR today   LOS: 33 days    [name] 08/12/2017 Ready for CIR patient examined and medical record reviewed,agree with above note. [name] 08/12/2017

## 2017-08-12 NOTE — Progress Notes (Signed)
Report received from [place]. Ready to send to room 18 on [address]

## 2017-08-12 NOTE — Progress Notes (Signed)
Called to pt room. Notified by day shift RN pt trach was out. Upon arrival to pt room RRT at bedside, trach was back in. RRT stated the tie on the left side was undone upon arrival to room. Pt sats never dropped according to dayshift RN. Pt doesn't appear to be in any distress. Sats are 99% on trach collar.  Will continue to monitor and pass information to day team.

## 2017-08-12 NOTE — Progress Notes (Signed)
Called for report on pt for [facility] room 18 nurse unavailable

## 2017-08-12 NOTE — H&P (Signed)
Physical Medicine and Rehabilitation Admission H&P  Chief complaint: Weakness  HPI: [name] is a 77 y.o. right handed female who is been followed by cardiothoracic surgery for a fusiform ascending aneurysm which has increased in size over the past 1-2 years with associated moderate aortic insufficiency. Per chart review patient lives with spouse. Independent with assistive device using a cane prior to admission. One level home 4 steps to entry. Spouse can assist as needed. Presented 07/09/2017 due to increasing size of a ascending aneurysm and underwent repair of ascending aneurysm as well as aortic valve replacement 07/10/2017 per Dr. Nils Pyle. Postoperative ventilatory support. Findings a small amount of anterior mediastinal hematoma as well as orthostasis requiring pressor support. Her urine output decreased concern for tamponade and return back to the operating room for reexploration to remove any blood/hematoma 07/11/2017 as well as findings RV function significantly decreased from earlier postoperative status and underwent implantation of percutaneous right ventricular assist device. Hospital course ventilatory support underwent tracheostomy 07/25/2017 per Dr. Nelda Marseille and has since been down sized to a #4 cuff less 08/08/2017 and tolerating PMV. No current plan to decannulate as did not tolerate capping trial. Hospital course acute blood loss anemia. Bouts of atrial fibrillation followed by cardiology services and currently maintained on amiodarone. Currently maintained on subcutaneous Lovenox for DVT prophylaxis. Bouts of hypokalemia supplement added. Dysphagia #1 pudding liquids and nasogastric tube feeds for nutritional support. Physical as well as occupational therapy evaluations completed 07/19/2017. Request made for physical medicine rehabilitation consult. Patient was admitted for a comprehensive rehabilitation program  Review of Systems  Constitutional: Negative for chills and fever.    HENT: Negative for hearing loss.  Eyes: Negative for blurred vision and double vision.  Respiratory: Positive for shortness of breath.  Cardiovascular: Positive for palpitations and leg swelling. Negative for chest pain.  Gastrointestinal: Positive for constipation. Negative for nausea.  GERD  Genitourinary: Negative for dysuria, flank pain and hematuria.  Musculoskeletal: Positive for joint pain and myalgias.  Skin: Negative for rash.  Neurological: Positive for tremors.  Psychiatric/Behavioral: Positive for depression. The patient has insomnia.  Anxiety  All other systems reviewed and are negative.       Past Medical History:  Diagnosis Date  . ACL tear    right Dr. Gladstone Pih   . Adenomatous polyp   . Anxiety   . Aorta aneurysm ([place])   . Arthritis   . Ascending aortic aneurysm ([place])    note per chart per Dr Lucianne Lei Tright 4.7 cm 04/15/2015   . B12 deficiency   . Cancer ([place])   . Cataracts, both eyes 10/2006  . Chronic bronchitis ([place])   . Constipation   . Depression   . DUB (dysfunctional uterine bleeding) 10/96  . ETD (eustachian tube dysfunction)   . Fall   . Fibromyalgia   . GERD (gastroesophageal reflux disease)   . Glaucoma    ([place]) Dr. Arnoldo Morale   . Glaucoma    bilaterally  . Helicobacter pylori (H. pylori)   . Hiatal hernia   . History of bronchitis   . History of frequent urinary tract infections   . History of kidney stones   . History of left shoulder fracture    pt states fell off bed and broke ball in shoulder had rod placed   . Hyperlipidemia   . Hypertension   . Hypothyroidism   . Insomnia   . Leg wound, left    healed   . MVP (mitral valve prolapse)   .  Occasional tremors    left arm   . Osteoarthritis   . Osteopenia   . Osteoporosis   . Other and unspecified hyperlipidemia   . Post-menopausal   . Thyroid cancer ([place])   . Thyroid nodule   . Tremor         Past Surgical History:  Procedure Laterality Date  . AORTIC VALVE REPLACEMENT N/A  07/10/2017   Procedure: AORTIC VALVE REPLACEMENT (AVR); Surgeon: [name], Collier Salina, MD; Location: [facility]; Service: Open Heart Surgery; Laterality: N/A;  . APPENDECTOMY    . BUNIONECTOMY  08/1999   right - Dr. Irving Shows   . CHOLECYSTECTOMY  1979  . DILATION AND CURETTAGE OF UTERUS  03/31/95   Dr. Ovid Curd   . EXPLORATION POST OPERATIVE OPEN HEART N/A 07/11/2017   Procedure: EXPLORATION POST OPERATIVE OPEN HEART; Surgeon: Ivin Poot, MD; Location: [facility]; Service: Open Heart Surgery; Laterality: N/A;  . EYE SURGERY     also had left cataract removed   . FRACTURE SURGERY    . HEMATOMA EVACUATION N/A 07/11/2017   Procedure: EVACUATION HEMATOMA; Surgeon: Ivin Poot, MD; Location: [facility]; Service: Open Heart Surgery; Laterality: N/A;  . HERNIA REPAIR    . NEPHROLITHOTOMY Left 04/07/2017   Procedure: NEPHROLITHOTOMY PERCUTANEOUS WITH SURGEON ACCESS; Surgeon: Ardis Hughs, MD; Location[facility]; Service: Urology; Laterality: Left;  . ORIF HUMERUS FRACTURE  05/30/2011   Procedure: OPEN REDUCTION INTERNAL FIXATION (ORIF) PROXIMAL HUMERUS FRACTURE; Surgeon: Augustin Schooling; Location: [facility] [facility]; Service: Orthopedics; Laterality: Left; open reduction internal fixation of proximal humerus fracture  . PLACEMENT OF CENTRIMAG VENTRICULAR ASSIST DEVICE Right 07/11/2017   Procedure: PLACEMENT OF CENTRIMAG VENTRICULAR ASSIST DEVICE; Surgeon: Ivin Poot, MD; Location: [facility]; Service: Open Heart Surgery; Laterality: Right;  . REMOVAL OF CENTRIMAG VENTRICULAR ASSIST DEVICE N/A 07/17/2017   Procedure: REMOVAL OF RVAD WITH PUMP STANDBY; Surgeon: Ivin Poot, MD; Location: [facility]; Service: Open Heart Surgery; Laterality: N/A;  . REPLACEMENT ASCENDING AORTA N/A 07/10/2017   Procedure: REPLACEMENT ASCENDING AORTA; Surgeon: Ivin Poot, MD; Location: [facility]; Service: Open Heart Surgery; Laterality: N/A;  . right knee replacement  3/11   Dr. Gladstone Pih  . RIGHT/LEFT HEART CATH AND CORONARY ANGIOGRAPHY N/A  06/13/2017   Procedure: RIGHT/LEFT HEART CATH AND CORONARY ANGIOGRAPHY; Surgeon: Jolaine Artist, MD; Location: [facility]; Service: Cardiovascular; Laterality: N/A;  . rt. eye cataract  05/28/07  . TEE WITHOUT CARDIOVERSION N/A 07/10/2017   Procedure: TRANSESOPHAGEAL ECHOCARDIOGRAM (TEE); Surgeon: [name], Collier Salina, MD; Location: [facility]; Service: Open Heart Surgery; Laterality: N/A;  . TEE WITHOUT CARDIOVERSION  07/11/2017   Procedure: TRANSESOPHAGEAL ECHOCARDIOGRAM (TEE); Surgeon: [name], Collier Salina, MD; Location: [facility]; Service: Open Heart Surgery;;  . TEE WITHOUT CARDIOVERSION N/A 07/17/2017   Procedure: TRANSESOPHAGEAL ECHOCARDIOGRAM (TEE); Surgeon: [name], Collier Salina, MD; Location: [facility]; Service: Open Heart Surgery; Laterality: N/A;  . THYROID LOBECTOMY Left 07/27/2015   Procedure: LEFT THYROID LOBECTOMY; Surgeon: Fanny Skates, MD; [facility]; Service: General; Laterality: Left;  . THYROIDECTOMY N/A 08/06/2015   Procedure: RIGHT THYROID LOBECTOMY, REIMPLANTATION PARATHYROID; Surgeon: Fanny Skates, MD; [facility]; Service: General; Laterality: N/A;  . VENTRAL HERNIA REPAIR  2/99        Family History  Problem Relation Age of Onset  . Cancer Mother    PANCREATIC   . Osteoporosis Mother   . Hip fracture Mother   . Heart attack Father 63   Died 68  . Heart disease Father   .  Arthritis Father   . Hypertension Sister   . Cancer Sister    skin  . Hypertension Sister   . Breast cancer Neg Hx    Social History: reports that has never smoked. she has never used smokeless tobacco. She reports that she does not drink alcohol or use drugs.  Allergies:       Allergies  Allergen Reactions  . Alendronate Sodium Nausea Only  . Levofloxacin Nausea And Vomiting  . Lipitor [Atorvastatin Calcium] Other (See Comments)    myalgias  . Lyrica [Pregabalin] Other (See Comments)    SORES IN MOUTH   . Meloxicam Other (See Comments)    Vaginal itching   . Sibutramine Hcl  Monohydrate Other (See Comments)    Reaction unknown  . Actonel [Risedronate] Other (See Comments)    Bone pain and nausea   . Latex Rash    RA to latex 1982; includes bandaids             Facility-Administered Medications Prior to Admission  Medication Dose Route Frequency Provider Last Rate Last Dose  . cyanocobalamin ((VITAMIN B-12)) injection 1,000 mcg 1,000 mcg Intramuscular Q30 days Chipper Herb, MD  1,000 mcg at 06/07/17 1004         Medications Prior to Admission  Medication Sig Dispense Refill  . aspirin 81 MG tablet Take 1 tablet (81 mg total) by mouth every other day. (Patient taking differently: Take 81 mg by mouth at bedtime. ) 30 tablet   . atenolol (TENORMIN) 50 MG tablet TAKE 1 TABLET DAILY (Patient taking differently: TAKE 1 TABLET BY MOUTH DAILY) 30 tablet 5  . gabapentin (NEURONTIN) 100 MG capsule Take 100 mg by mouth at bedtime.    Marland Kitchen levothyroxine (SYNTHROID, LEVOTHROID) 100 MCG tablet Take 100 mcg by mouth every other day. Alternates and takes 100 mcg one day and 112 mcg the next    . levothyroxine (SYNTHROID, LEVOTHROID) 112 MCG tablet Take 112 mcg by mouth daily before breakfast. Alternates and takes 100 mcg one day and 112 mcg the next  4  . Omega 3 1200 MG CAPS Take 1,200 mg by mouth at bedtime.    . polyethylene glycol (MIRALAX / GLYCOLAX) packet Take 17 g by mouth daily.     Marland Kitchen PROCTOZONE-HC 2.5 % rectal cream APPLY TO RECTALLY 2 TIMES A DAY AS NEEDED (Patient taking differently: APPLY TO RECTALLY 1 TIMES A DAY AT BEDTIME) 30 g 1  . Simethicone (GAS-X PO) Take 1 tablet by mouth daily as needed (GAS).    Marland Kitchen simvastatin (ZOCOR) 80 MG tablet TAKE ONE TABLET AT BEDTIME (Patient taking differently: TAKE ONE HALF (40 MG) TABLET BY MOUTH DAILY) 30 tablet 5  . TRAVATAN Z 0.004 % SOLN ophthalmic solution INSTILL 1 DROP INTO RIGHT EYE AT BEDTIME (Patient taking differently: INSTILL 1 DROP INTO BOTH EYES AT BEDTIME) 2.5 mL 2  . venlafaxine XR (EFFEXOR-XR) 75 MG 24 hr  capsule TAKE (1) CAPSULE DAILY 90 capsule 0  . zolpidem (AMBIEN) 10 MG tablet TAKE 1/2 TABLET AT BEDTIME AS NEEDED (Patient taking differently: TAKE 0.5 TABLET (5 MG) BY MOUTH AT BEDTIME.) 45 tablet 2  . celecoxib (CELEBREX) 200 MG capsule TAKE (1) CAPSULE DAILY (Patient not taking: Reported on 06/28/2017) 30 capsule 0  . clobetasol cream (TEMOVATE) 9.37 % Apply 1 application topically 2 (two) times daily. (Patient not taking: Reported on 06/28/2017) 30 g 2  . clorazepate (TRANXENE) 7.5 MG tablet TAKE 1 TABLET DAILY AS NEEDED (Patient taking differently:  TAKE 1 TABLET DAILY AS NEEDED FOR PAIN) 30 tablet 2  . docusate sodium (COLACE) 100 MG capsule Take 2 capsules (200 mg total) by mouth 2 (two) times daily. (Patient not taking: Reported on 06/28/2017) 120 capsule 11  . famotidine (PEPCID) 20 MG tablet Take 1 tablet (20 mg total) by mouth 2 (two) times daily. (Patient not taking: Reported on 06/28/2017) 60 tablet 1  . fluticasone (FLONASE) 50 MCG/ACT nasal spray Place 2 sprays into both nostrils daily. (Patient not taking: Reported on 06/28/2017) 16 g 6  . mupirocin ointment (BACTROBAN) 2 % Apply 1 application topically 2 (two) times daily. (Patient not taking: Reported on 06/28/2017) 22 g 1  . traMADol (ULTRAM) 50 MG tablet Take 1-2 tablets (50-100 mg total) by mouth every 6 (six) hours as needed for moderate pain. (Patient not taking: Reported on 06/28/2017) 20 tablet 0  . triamcinolone cream (KENALOG) 0.1 % Apply 1 application topically 3 (three) times daily. Avoid face and genitalia (Patient not taking: Reported on 06/28/2017) 28.4 g 0   Drug Regimen Review  Drug regimen was reviewed and remains appropriate with no significant issues identified  Home:  Home Living  Family/patient expects to be discharged to:: Private residence  Living Arrangements: Spouse/significant other  Available Help at Discharge: Family, Available 24 hours/day  Type of Home: Mobile home  Home Access: Stairs to enter  [employer] of Steps: 4  Entrance Stairs-Rails: Right, Left, Can reach both  Home Layout: One level  Bathroom Shower/Tub: Tourist information centre manager: Handicapped height  Home Equipment: [place] - single point  Additional Comments: ? may have 02 in home  Functional History:  Prior Function  Level of Independence: Independent with assistive device(s)  Comments: walked with cane, sponge bathed and washed hair over sink, worked together with husband on housekeeping and cooking, drives rarely  Functional Status:  Mobility:  Bed Mobility  Overal bed mobility: Needs Assistance  Bed Mobility: Supine to Sit  Rolling: Min assist, +2 for physical assistance  Sidelying to sit: Max assist, +2 for physical assistance, HOB elevated  Supine to sit: Min assist, HOB elevated  Sit to supine: Mod assist, +2 for physical assistance  Sit to sidelying: Max assist, +2 for physical assistance  General bed mobility comments: Cues for hand placement to avoid pulling. Pt required assistance to lower trunk and lift B LEs into bed.  Transfers  Overall transfer level: Needs assistance  Equipment used: 4-wheeled walker(EVA walker)  Transfers: Sit to/from Stand  Sit to Stand: Max assist, +2 physical assistance  Stand pivot transfers: +2 physical assistance, Max assist  Squat pivot transfers: Max assist, +2 safety/equipment, +2 physical assistance  General transfer comment: Cues for hand placement on knees to and from seated surface to maintain sternal precautions. Pt followed commands better. Pt required cues for hip extension and anterior translation into standing. Pt walks B feet forward and flexes B hips. Pt unable to correct with VCs and requires tactile cues to facilitate extension.  Ambulation/Gait  Ambulation/Gait assistance: Max assist, +2 physical assistance  Ambulation Distance (Feet): 34 Feet  Assistive device: 4-wheeled walker(EVA)  Gait Pattern/deviations: Shuffle, Trunk flexed, Decreased stride  length, Leaning posteriorly, Narrow base of support, Scissoring  General Gait Details: Unsteady ambulation using Eva walker and maxA+2 to maintain balance and trunk elevation, and control walker. Pt with significant posterior lean, with feet too far forward and increased difficulty translating her trunk forward despite multimodal cues. Pt presents with scissoring narrow BOS and cues to  increase BOS.  Gait velocity: Decreased  Gait velocity interpretation: Below normal speed for age/gender   ADL:  ADL  Overall ADL's : Needs assistance/impaired  Eating/Feeding: Moderate assistance  Eating/Feeding Details (indicate cue type and reason): pudding thick liquids 2/12  Grooming: Minimal assistance, Wash/dry face, Brushing hair  Grooming Details (indicate cue type and reason): Pt able to bring hand to face and wash eyes with wash cloth.  Upper Body Bathing: Maximal assistance, Sitting  Lower Body Bathing: Total assistance, Bed level  Upper Body Dressing : Moderate assistance  Upper Body Dressing Details (indicate cue type and reason): Pt assisted by lifting BUE to put through [employer] Transfer: Maximal assistance, +2 for physical assistance, +2 for safety/equipment, Ambulation(EVA walker)  Toilet Transfer Details (indicate cue type and reason): simualted through recliner transfer  [place] and Hygiene: Total assistance  Toileting - Clothing Manipulation Details (indicate cue type and reason): Pt able to stand with +2 A while pericare was completed  Functional mobility during ADLs: Maximal assistance, +2 for physical assistance, Cueing for sequencing, Cueing for safety(Eva walker)  General ADL Comments: Used tray mirror to facilitate anterior weight shifts during ADL tasks. Pt states she will do better if she has her tennis shoes; husband present at end of session and asked to bring in shoes. Speech hasd finished prior to OT session adn pt is on pudding thick diet. Pt  requetingto drink a coke. PT demonstrates poor insight into understandingher swallowing restricitons  Cognition:  Cognition  Overall Cognitive Status: Impaired/Different from baseline  Orientation Level: Oriented to person, Oriented to place, Oriented to time, Oriented to situation, Disoriented to situation(intermittent)  Cognition  Arousal/Alertness: Awake/alert  Behavior During Therapy: Anxious  Overall Cognitive Status: Impaired/Different from baseline  Area of Impairment: Attention, Memory, Safety/judgement, Awareness, Problem solving  Current Attention Level: Sustained  Memory: Decreased recall of precautions, Decreased short-term memory  Following Commands: Follows one step commands consistently  Safety/Judgement: Decreased awareness of safety, Decreased awareness of deficits  Awareness: Emergent  Problem Solving: Slow processing, Difficulty sequencing, Requires tactile cues, Requires verbal cues  General Comments: tangential at times; talking about doing  Difficult to assess due to: (PMV in place. )  Physical Exam:  Blood pressure 127/79, pulse 91, temperature 97.9 F (36.6 C), temperature source Oral, resp. rate 18, height 5\' 5"  (1.651 m), weight 73.1 kg (161 lb 2.5 oz), SpO2 100 %.  Physical Exam  Vitals reviewed.  HENT:  Head: Normocephalic.  Nasogastric tube in place. Raspy voice  Eyes:  Pupils round and reactive to light  Neck:  Trach tube in place  Cardiovascular: Normal rate and regular rhythm. Exam reveals no friction rub.  No murmur heard. Cardiac rate controlled  Respiratory: No respiratory distress. She has no wheezes. She has no rales.  Fair inspiratory effort. Clear to auscultation  GI: Soft. Bowel sounds are normal. She exhibits no distension. There is no tenderness. There is no rebound.  Musculoskeletal: She exhibits no tenderness or deformity.  Neurological:  Sitting up in chair. Makes good eye contact with examiner. Followed full commands. Provides her  name agent date of birth. Moves all extremities. UE 4- to 4/5 prox to distal. LE: 2 to 2+ HF, 3- KE and 3+ ADF/PF. No focal sensory deficits  Psychiatric: She has a normal mood and affect. Her behavior is normal.   Lab Results Last 48 Hours  Imaging Results (Last 48 hours)     Medical Problem List and Plan:  1. Debility secondary to debility related to abdominal aneurysm/AS with AVR 07/10/2017 status post repair and subsequent cardiogenic shock  -admit to inpatient rehab  2. DVT Prophylaxis/Anticoagulation: Subcutaneous Lovenox. Check vascular study  3. Pain Management: Tylenol as needed  4. Mood: Klonopin 1 mg daily at bedtime, Seroquel 75 mg bedtime, Effexor 37.5 mg twice a day  5. Neuropsych: This patient is capable of making decisions on her own behalf.  6. Skin/Wound Care: Routine skin checks  7. Fluids/Electrolytes/Nutrition: Routine I&O's with follow-up chemistries  8. Tracheostomy 07/25/2017. Downsized to #4 cuffless 08/08/2017. No current plan to decannulate due to failure with capping. Follow-up per Dr. Nelda Marseille. Continue nebulizers  9. Atrial fibrillation. Amiodarone 200 mg daily. Cardiac rate controlled  10. Dysphagia. Dysphagia #1 pudding liquids. Follow-up speech therapy  11. Acute diastolic congestive heart failure. Lasix 40 mg daily, Aldactone 25  mg daily. Monitor for any signs of fluid overload  12. Hypothyroidism. Synthroid  13. Hyperglycemia secondary to tube feeds. Continue to monitor blood sugar until after tube feeds discontinued  Post Admission Physician Evaluation:  1. Functional deficits secondary to debility. 2. Patient is admitted to receive collaborative, interdisciplinary care between the physiatrist, rehab nursing staff, and therapy team. 3. Patient's level of medical complexity and substantial therapy needs in context of that medical necessity cannot be provided at a lesser intensity of care such as a SNF. 4. Patient has experienced substantial functional loss from his/her baseline which was documented above under the "Functional History" and "Functional Status" headings. Judging by the patient's diagnosis, physical exam, and functional history, the patient has potential for functional progress which will result in measurable gains while on inpatient rehab. These gains will be of substantial and practical use upon discharge in facilitating mobility and self-care at the household level. 5. Physiatrist will provide 24 hour management of medical needs as well as oversight of the therapy plan/treatment and provide guidance as appropriate regarding the interaction of the two. 6. The Preadmission Screening has been reviewed and patient status is unchanged unless otherwise stated above. 7. 24 hour rehab nursing will assist with bladder management, bowel management, safety, skin/wound care, disease management, medication administration, pain management and patient education and help integrate therapy concepts, techniques,education, etc. 8. PT will assess and treat for/with: Lower extremity strength, range of motion, stamina, balance, functional mobility, safety, adaptive techniques and equipment, NMR, pain control, respiratory tolerance. Goals are: min to mod assist. 9. OT will assess and treat for/with: ADL's, functional mobility, safety,  upper extremity strength, adaptive techniques and equipment, NMR, activity tolerance, family ed. Goals are: min to mod assist. Therapy may proceed with showering this patient. 10. SLP will assess and treat for/with: speech, swallowing. Goals are: mod I to supervision. 11. Case Management and Social Worker will assess and treat for psychological issues and discharge planning. 12. Team conference will be held weekly to assess progress toward goals and to determine barriers to discharge. 13. Patient will receive at least 3 hours of therapy per day at least 5 days per week. 14. ELOS: 15-20 days  15. Prognosis: excellent Meredith Staggers, MD, [facility]  08/12/2017  Lavon Paganini [place], PA-C  08/10/2017

## 2017-08-12 NOTE — Progress Notes (Signed)
Request report for room 18 4 [place] nurse at lunch, will return call upon return

## 2017-08-12 NOTE — Progress Notes (Signed)
Gave the report to 4W inpatient rehab nurse. Pt received discharge instructions and understood it. Pt has PICC on Rt. Arm & trach collar, Tube feeding running.  HS [employer]

## 2017-08-13 ENCOUNTER — Inpatient Hospital Stay (HOSPITAL_COMMUNITY): Payer: Medicare Other

## 2017-08-13 LAB — GLUCOSE, CAPILLARY
GLUCOSE-CAPILLARY: 92 mg/dL (ref 65–99)
GLUCOSE-CAPILLARY: 98 mg/dL (ref 65–99)
Glucose-Capillary: 106 mg/dL — ABNORMAL HIGH (ref 65–99)
Glucose-Capillary: 115 mg/dL — ABNORMAL HIGH (ref 65–99)
Glucose-Capillary: 85 mg/dL (ref 65–99)
Glucose-Capillary: 96 mg/dL (ref 65–99)

## 2017-08-13 MED ORDER — GUAIFENESIN-DM 100-10 MG/5ML PO SYRP
10.0000 mL | ORAL_SOLUTION | Freq: Four times a day (QID) | ORAL | Status: DC
  Administered 2017-08-13 – 2017-08-20 (×27): 10 mL via ORAL
  Filled 2017-08-13 (×28): qty 10

## 2017-08-13 NOTE — Evaluation (Signed)
Occupational Therapy Assessment and Plan  Patient Details  Name: [name] MRN: [id number] Date of Birth: [birth year]  OT Diagnosis: abnormal posture, cognitive deficits and muscle weakness (generalized) Rehab Potential: Rehab Potential (ACUTE ONLY): Good ELOS: 3 weeks   Today's Date: 08/13/2017 OT Individual Time: 1405-1500 OT Individual Time Calculation (min): 55 min     Problem List:  Patient Active Problem List   Diagnosis Date Noted  . Debility 08/12/2017  . PAF (paroxysmal atrial fibrillation) ([place])   . Pressure injury of skin 08/04/2017  . Tracheostomy in place [facility])   . Acute encephalopathy   . HCAP (healthcare-associated pneumonia)   . Acute respiratory failure ([place])   . RVF (right ventricular failure) ([place])   . RVAD (right ventricular assist device) present ([place])   . Cardiogenic shock ([place])   . S/P AVR 07/10/2017  . Nephrolithiasis 04/07/2017  . Vitamin B 12 deficiency 03/01/2016  . Ascending aortic aneurysm ([place]) 08/20/2015  . Thyroid cancer ([place]) 07/30/2015  . Left thyroid nodule 07/27/2015  . Osteoporosis with fracture 04/16/2014  . At high risk for falls 04/16/2014  . Proximal humerus fracture 05/30/2011  . CHEST PAIN, PRECORDIAL 02/26/2010  . Hyperlipidemia 02/23/2010  . Depression 02/23/2010  . GLAUCOMA 02/23/2010  . CATARACTS 02/23/2010  . RHEUMATIC FEVER 02/23/2010  . Essential hypertension 02/23/2010  . MITRAL VALVE PROLAPSE 02/23/2010  . GERD 02/23/2010  . Osteoarthritis 02/23/2010  . Primary fibromyalgia syndrome 02/23/2010    Past Medical History:  Past Medical History:  Diagnosis Date  . ACL tear    right  Dr. Gladstone Pih   . Adenomatous polyp   . Anxiety   . Aorta aneurysm ([place])   . Arthritis   . Ascending aortic aneurysm ([place])    note per chart per Dr Lucianne Lei Tright 4.7 cm 04/15/2015   . B12 deficiency   . Cancer ([place])   . Cataracts, both eyes 10/2006  . Chronic bronchitis ([place])   . Constipation   . Depression   . DUB (dysfunctional  uterine bleeding) 10/96  . ETD (eustachian tube dysfunction)   . Fall   . Fibromyalgia   . GERD (gastroesophageal reflux disease)   . Glaucoma    ([place]) Dr. Arnoldo Morale   . Glaucoma    bilaterally  . Helicobacter pylori (H. pylori)   . Hiatal hernia   . History of bronchitis   . History of frequent urinary tract infections   . History of kidney stones   . History of left shoulder fracture    pt states fell off bed and broke ball in shoulder had rod placed   . Hyperlipidemia   . Hypertension   . Hypothyroidism   . Insomnia   . Leg wound, left    healed   . MVP (mitral valve prolapse)   . Occasional tremors    left arm   . Osteoarthritis   . Osteopenia   . Osteoporosis   . Other and unspecified hyperlipidemia   . Post-menopausal   . Thyroid cancer ([place])   . Thyroid nodule   . Tremor    Past Surgical History:  Past Surgical History:  Procedure Laterality Date  . AORTIC VALVE REPLACEMENT N/A 07/10/2017   Procedure: AORTIC VALVE REPLACEMENT (AVR);  Surgeon: [name], Collier Salina, MD;  Location: [place];  Service: Open Heart Surgery;  Laterality: N/A;  . APPENDECTOMY    . BUNIONECTOMY  08/1999   right - Dr. Irving Shows   . CHOLECYSTECTOMY  1979  . DILATION AND CURETTAGE OF  UTERUS  03/31/95   Dr. Ovid Curd   . EXPLORATION POST OPERATIVE OPEN HEART N/A 07/11/2017   Procedure: EXPLORATION POST OPERATIVE OPEN HEART;  Surgeon: Ivin Poot, MD;  Location: [place];  Service: Open Heart Surgery;  Laterality: N/A;  . EYE SURGERY     also had left cataract removed   . FRACTURE SURGERY    . HEMATOMA EVACUATION N/A 07/11/2017   Procedure: EVACUATION HEMATOMA;  Surgeon: Ivin Poot, MD;  Location: [place];  Service: Open Heart Surgery;  Laterality: N/A;  . HERNIA REPAIR    . NEPHROLITHOTOMY Left 04/07/2017   Procedure: NEPHROLITHOTOMY PERCUTANEOUS WITH SURGEON ACCESS;  Surgeon: Ardis Hughs, MD;  Location[facility];  Service: Urology;  Laterality: Left;  . ORIF HUMERUS FRACTURE  05/30/2011    Procedure: OPEN REDUCTION INTERNAL FIXATION (ORIF) PROXIMAL HUMERUS FRACTURE;  Surgeon: Augustin Schooling;  Location: [place];  Service: Orthopedics;  Laterality: Left;  open reduction internal fixation of proximal humerus fracture  . PLACEMENT OF CENTRIMAG VENTRICULAR ASSIST DEVICE Right 07/11/2017   Procedure: PLACEMENT OF CENTRIMAG VENTRICULAR ASSIST DEVICE;  Surgeon: Ivin Poot, MD;  Location: [place];  Service: Open Heart Surgery;  Laterality: Right;  . REMOVAL OF CENTRIMAG VENTRICULAR ASSIST DEVICE N/A 07/17/2017   Procedure: REMOVAL OF RVAD WITH PUMP STANDBY;  Surgeon: Ivin Poot, MD;  Location: [place];  Service: Open Heart Surgery;  Laterality: N/A;  . REPLACEMENT ASCENDING AORTA N/A 07/10/2017   Procedure: REPLACEMENT ASCENDING AORTA;  Surgeon: Ivin Poot, MD;  Location: [place];  Service: Open Heart Surgery;  Laterality: N/A;  . right knee replacement  3/11   Dr. Gladstone Pih  . RIGHT/LEFT HEART CATH AND CORONARY ANGIOGRAPHY N/A 06/13/2017   Procedure: RIGHT/LEFT HEART CATH AND CORONARY ANGIOGRAPHY;  Surgeon: Jolaine Artist, MD;  Location: [place] [facility];  Service: Cardiovascular;  Laterality: N/A;  . rt. eye cataract  05/28/07  . TEE WITHOUT CARDIOVERSION N/A 07/10/2017   Procedure: TRANSESOPHAGEAL ECHOCARDIOGRAM (TEE);  Surgeon: [name], Collier Salina, MD;  Location: [place];  Service: Open Heart Surgery;  Laterality: N/A;  . TEE WITHOUT CARDIOVERSION  07/11/2017   Procedure: TRANSESOPHAGEAL ECHOCARDIOGRAM (TEE);  Surgeon: [name], Collier Salina, MD;  Location: [place];  Service: Open Heart Surgery;;  . TEE WITHOUT CARDIOVERSION N/A 07/17/2017   Procedure: TRANSESOPHAGEAL ECHOCARDIOGRAM (TEE);  Surgeon: [name], Collier Salina, MD;  Location: [place];  Service: Open Heart Surgery;  Laterality: N/A;  . THYROID LOBECTOMY Left 07/27/2015   Procedure: LEFT THYROID LOBECTOMY;  Surgeon: Fanny Skates, MD;  [facility];  Service: General;  Laterality: Left;  . THYROIDECTOMY N/A 08/06/2015   Procedure:  RIGHT THYROID LOBECTOMY, REIMPLANTATION PARATHYROID;  Surgeon: Fanny Skates, MD;  [facility];  Service: General;  Laterality: N/A;  . VENTRAL HERNIA REPAIR  2/99    Assessment & Plan Clinical Impression: [name] is a 77 y.o. right handed female who is been followed by cardiothoracic surgery for a fusiform ascending aneurysm which has increased in size over the past 1-2 years with associated moderate aortic insufficiency. Per chart review patient lives with spouse. Independent with assistive device using a cane prior to admission. One level home 4 steps to entry. Spouse can assist as needed. Presented 07/09/2017 due to increasing size of a ascending aneurysm and underwent repair of ascending aneurysm as well as aortic valve replacement 07/10/2017 per Dr. Nils Pyle. Postoperative ventilatory support. Findings a small amount of anterior mediastinal hematoma as well as orthostasis requiring  pressor support. Her urine output decreased concern for tamponade and return back to the operating room for reexploration to remove any blood/hematoma 07/11/2017 as well as findings RV function significantly decreased from earlier postoperative status and underwent implantation of percutaneous right ventricular assist device. Hospital course ventilatory support underwent tracheostomy 07/25/2017 per Dr. Nelda Marseille and has since been down sized to a #4 cuff less 08/08/2017 and tolerating PMV. No current plan to decannulate as did not tolerate capping trial. Hospital course acute blood loss anemia. Bouts of atrial fibrillation followed by cardiology services and currently maintained on amiodarone. Currently maintained on subcutaneous Lovenox for DVT prophylaxis. Bouts of hypokalemia supplement added. Dysphagia #1 pudding liquids and nasogastric tube feeds for nutritional support. Physical as well as occupational therapy evaluations completed 07/19/2017. Request made for physical medicine rehabilitation consult. Patient  was admitted for a comprehensive rehabilitation program     Patient transferred to CIR on 08/12/2017 .    Patient currently requires max with basic self-care skills secondary to muscle weakness, decreased cardiorespiratoy endurance, decreased awareness, decreased problem solving, decreased safety awareness, decreased memory and delayed processing and decreased sitting balance, decreased standing balance, decreased postural control and difficulty maintaining precautions.  Prior to hospitalization, patient could complete ADLs with modified independent .  Patient will benefit from skilled intervention to decrease level of assist with basic self-care skills and increase independence with basic self-care skills prior to discharge home with care partner.  Anticipate patient will require 24 hour supervision and minimal physical assistance and follow up home health.  OT - End of Session Activity Tolerance: Tolerates 10 - 20 min activity with multiple rests Endurance Deficit: Yes Endurance Deficit Description: Rest breaks required throughout seated ADLs OT Assessment Rehab Potential (ACUTE ONLY): Good OT Patient demonstrates impairments in the following area(s): Balance;Pain;Cognition;Perception;Safety;Endurance OT Basic ADL's Functional Problem(s): Eating;Grooming;Bathing;Dressing;Toileting OT Transfers Functional Problem(s): Toilet;Tub/Shower OT Additional Impairment(s): None OT Plan OT Intensity: Minimum of 1-2 x/day, 45 to 90 minutes OT Frequency: 5 out of 7 days OT Duration/Estimated Length of Stay: 3 weeks OT Treatment/Interventions: Balance/vestibular training;Discharge planning;Pain management;Self Care/advanced ADL retraining;Therapeutic Activities;UE/LE Coordination activities;Cognitive remediation/compensation;Disease mangement/prevention;Functional mobility training;Patient/family education;Skin care/wound managment;Therapeutic Exercise;Community reintegration;DME/adaptive equipment  instruction;Neuromuscular re-education;Psychosocial support;UE/LE Strength taining/ROM OT Self Feeding Anticipated Outcome(s): Mod I OT Basic Self-Care Anticipated Outcome(s): Supervision OT Toileting Anticipated Outcome(s): Supervision OT Bathroom Transfers Anticipated Outcome(s): Supervision-min A OT Recommendation Recommendations for Other Services: Neuropsych consult Patient destination: Home Follow Up Recommendations: Home health OT Equipment Recommended: To be determined   Skilled Therapeutic Intervention Pt seen for OT eval and ADL session. Pt in supine upon arrival with daughter and sisters present. Pt voicing increased fatigue from previous tx session, however, with encouragement willing to participate on limited basis with therapist. PMSV donned, pt tolerated well throughout session, O2 remaining >95% throughout session with PMSV donned and activity. Pt requesting snack. Provided chocolate pudding which pt self fed while seated EOB, VCs required for small bites.  She completed stand pivot transfer to [facility]. Stood from elevated EOB with mod-max A with max cuing and demonstration for maintaining sternal pre-cautions as well as RW management/ VCs for sequencing of transfer.  Pants donned total A while seated on BSC, pt with strong posterior lean in standing, unable to self correct with cuing. She returned to bed at end of session, refusing transfer to recliner or w/c due to fatigue. She demonstrated difficulty maintaining sternal pre-cautions throughout session despite cuing and difficulty following VCs for mobility techniques. Pt left in supine at end of session, all  needs in reach, bed alarm on and NT entering.  Pt and family educated throughout session regarding role of OT, POC, OT/PT goals, role of SLP for PMSV wear schedule and upgrade of diet, and d/c planning.   OT Evaluation Precautions/Restrictions  Precautions Precautions: Fall;Sternal Precaution Comments: Reinforced sternal  precautions Restrictions Weight Bearing Restrictions: No Other Position/Activity Restrictions: sternal precautions General Chart Reviewed: Yes Pain   No/denies pain Home Living/Prior Functioning Home Living Family/patient expects to be discharged to:: Private residence Living Arrangements: Spouse/significant other Available Help at Discharge: Family, Available 24 hours/day Type of Home: Mobile home Home Access: Stairs to enter [employer] of Steps: 4 Entrance Stairs-Rails: Right, Left, Can reach both Home Layout: One level Bathroom Shower/Tub: Multimedia programmer: Handicapped height Bathroom Accessibility: Yes  Lives With: Spouse IADL History Homemaking Responsibilities: Yes Current License: No 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 Level of Independence: Requires assistive device for independence  Able to Take Stairs?: Yes Driving: No Vocation: Retired Comments: walked with cane, sponge bathed and washed hair over sink, worked together with husband on housekeeping and cooking, drives rarely Vision Baseline Vision/History: Wears glasses;Glaucoma Wears Glasses: Reading only Patient Visual Report: No change from baseline Vision Assessment?: No apparent visual deficits Perception  Perception: Within Functional Limits Praxis Praxis: Intact Cognition Overall Cognitive Status: Impaired/Different from baseline Arousal/Alertness: Awake/alert Orientation Level: Person;Place;Situation Person: Oriented Place: Oriented Situation: Oriented Year: 2019 Month: February Day of Week: Incorrect Memory: Impaired Memory Impairment: Decreased short term memory;Decreased recall of new information Decreased Short Term Memory: Verbal basic;Functional basic Immediate Memory Recall: Sock;Blue;Bed Memory Recall: Blue Memory Recall Blue: Without Cue Attention: Sustained;Focused Focused Attention: Appears intact Sustained  Attention: Appears intact Awareness: Impaired Awareness Impairment: Emergent impairment Problem Solving: Impaired Problem Solving Impairment: Verbal basic;Functional basic Safety/Judgment: Impaired Comments: Decreased safety awareness Sensation Sensation Light Touch: Appears Intact Proprioception: Appears Intact Coordination Gross Motor Movements are Fluid and Coordinated: No Fine Motor Movements are Fluid and Coordinated: No Coordination and Movement Description: Generalized weakness Motor  Motor Motor: Within Functional Limits Motor - Skilled Clinical Observations: generalized weakness Trunk/Postural Assessment  Cervical Assessment Cervical Assessment: Within Functional Limits Thoracic Assessment Thoracic Assessment: Exceptions to WFL(kyphotic) Lumbar Assessment Lumbar Assessment: Exceptions to WFL(Posterior pevic tilt) Postural Control Postural Control: Deficits on evaluation Trunk Control: posterior lean Protective Responses: impaired  Balance Balance Balance Assessed: Yes Static Sitting Balance Static Sitting - Level of Assistance: 4: Min assist Dynamic Sitting Balance Dynamic Sitting - Balance Support: During functional activity;Feet supported Dynamic Sitting - Level of Assistance: 4: Min assist;3: Mod assist Sitting balance - Comments: Sitting EOB to don socks Static Standing Balance Static Standing - Balance Support: During functional activity;Bilateral upper extremity supported Static Standing - Level of Assistance: 3: Mod assist Static Standing - Comment/# of Minutes: Standing at [place]; Posterior bias Dynamic Standing Balance Dynamic Standing - Balance Support: During functional activity;Bilateral upper extremity supported Dynamic Standing - Level of Assistance: 2: Max assist Dynamic Standing - Comments: Standing from [facility] with RW Extremity/Trunk Assessment RUE Assessment RUE Assessment: Exceptions to WFL(Generalized weakness; not formally assessed due to spinal  pre-cautions) LUE Assessment LUE Assessment: Exceptions to WFL(Generalized weakness; not formally assessed due to spinal pre-cautions)   See Function Navigator for Current Functional Status.   Refer to Care Plan for Long Term Goals  Recommendations for other services: Neuropsych   Discharge Criteria: Patient will be discharged from OT if patient refuses treatment 3 consecutive times without medical reason, if treatment goals not  met, if there is a change in medical status, if patient makes no progress towards goals or if patient is discharged from hospital.  The above assessment, treatment plan, treatment alternatives and goals were discussed and mutually agreed upon: by patient and by family  Kevona Lupinacci L 08/13/2017, 4:29 PM

## 2017-08-13 NOTE — Progress Notes (Signed)
[place] PHYSICAL MEDICINE & REHABILITATION     PROGRESS NOTE    Subjective/Complaints: Patient having trouble with secretions and unable to cough them up through her trach this morning.  This is causing some stress and anxiety  ROS: pt denies nausea, vomiting, diarrhea, cough, shortness of breath or chest pain   Objective: Vital Signs: Blood pressure (!) 149/76, pulse 87, temperature 98.9 F (37.2 C), temperature source Axillary, resp. rate 20, height 5\' 5"  (1.651 m), weight 74.4 kg (164 lb 0.4 oz), SpO2 98 %. No results found. Recent Labs    08/11/17 0346 08/12/17 1716  WBC 7.1 8.9  HGB 11.4* 12.7  HCT 38.5 41.2  PLT 212 246   Recent Labs    08/12/17 0500 08/12/17 1716  NA 144 142  K 3.4* 3.7  CL 101 101  GLUCOSE 96 113*  BUN 37* 34*  CREATININE 0.55 0.56  CALCIUM 7.9* 8.0*   CBG (last 3)  Recent Labs    08/13/17 0022 08/13/17 0330 08/13/17 0813  GLUCAP 115* 106* 96    Wt Readings from Last 3 Encounters:  08/13/17 74.4 kg (164 lb 0.4 oz)  08/12/17 73.8 kg (162 lb 11.2 oz)  07/06/17 76.5 kg (168 lb 11.2 oz)    Physical Exam:  HENT:  Head: Normocephalic.  Nasogastric tube in place.   Eyes:  Pupils round and reactive to light  Neck:  Trach tube in place. #4 .  Cardiovascular: Normal rate and regular rhythm. Exam reveals no friction rub.  No murmur heard. Cardiac: RRR without murmur. No JVD  Respiratory: Patient with mild distress and scattered rhonchi on exam.  Able to cough up some secretions with extra work but secretions thick  GI: Soft. Bowel sounds are normal. She exhibits no distension. There is no tenderness. There is no rebound.  Musculoskeletal: She exhibits no tenderness or deformity.  Neurological:  Sitting up in chair. Makes good eye contact with examiner. Followed full commands. Provides her name agent date of birth. Moves all extremities. UE 4- to 4/5 prox to distal. LE: 2 to 2+ HF, 3- KE and 3+ ADF/PF. No focal sensory deficits  --motor and sensory exam is normal Psychiatric: Patient generally cooperative but slightly anxious this morning     Assessment/Plan: 1.  Functional deficits secondary to debility which require 3+ hours per day of interdisciplinary therapy in a comprehensive inpatient rehab setting. Physiatrist is providing close team supervision and 24 hour management of active medical problems listed below. Physiatrist and rehab team continue to assess barriers to discharge/monitor patient progress toward functional and medical goals.  Function:  Bathing Bathing position      Bathing parts      Bathing assist        Upper Body Dressing/Undressing Upper body dressing                    Upper body assist        Lower Body Dressing/Undressing Lower body dressing                                  Lower body assist        Toileting Toileting     Toileting steps completed by helper: Adjust clothing prior to toileting, Performs perineal hygiene, Adjust clothing after toileting    Toileting assist Assist level: Two helpers   Transfers Chair/bed transfer  Psychologist, sport and exercise Comprehension Comprehension assist level: Understands basic 50 - 74% of the time/ requires cueing 25 - 49% of the time  Expression Expression assist level: Expresses basic 50 - 74% of the time/requires cueing 25 - 49% of the time. Needs to repeat parts of sentences.  Social Interaction Social Interaction assist level: Interacts appropriately 50 - 74% of the time - May be physically or verbally inappropriate.  Problem Solving Problem solving assist level: Solves basic 50 - 74% of the time/requires cueing 25 - 49% of the time  Memory Memory assist level: Recognizes or recalls 50 - 74% of the time/requires cueing 25 - 49% of the time  Medical Problem List and Plan:  1. Debility secondary to debility related to abdominal aneurysm/AS with  AVR 07/10/2017 status post repair and subsequent cardiogenic shock  -Beginning therapies today 2. DVT Prophylaxis/Anticoagulation: Subcutaneous Lovenox. Check vascular study  3. Pain Management: Tylenol as needed  4. Mood: Klonopin 1 mg daily at bedtime, Seroquel 75 mg bedtime, Effexor 37.5 mg twice a day    -Klonopin as needed during the day for anxiety  5. Neuropsych: This patient is capable of making decisions on her own behalf.  6. Skin/Wound Care: Routine skin checks  7. Fluids/Electrolytes/Nutrition: Routine I&O's with follow-up chemistries  8. Tracheostomy 07/25/2017. Downsized to #4 cuffless 08/08/2017. No current plan to decannulate due to failure with capping. Follow-up per Dr. Nelda Marseille. Continue nebulizers    -Guaifenesin to help with secretions   -Regular trach care/suction 9. Atrial fibrillation. Amiodarone 200 mg daily. Cardiac rate controlled  10. Dysphagia. Dysphagia #1 pudding liquids. Follow-up speech therapy  11. Acute diastolic congestive heart failure. Lasix 40 mg daily, Aldactone 25 mg daily. Monitor for any signs of fluid overload  12. Hypothyroidism. Synthroid  13. Hyperglycemia secondary to tube feeds. Continue to monitor blood sugar until after tube feeds discontinued     LOS (Days) 1 A FACE TO FACE EVALUATION WAS PERFORMED  Alger Simons T, MD 08/13/2017 10:05 AM

## 2017-08-13 NOTE — Evaluation (Signed)
Physical Therapy Assessment and Plan  Patient Details  Name: [name] MRN: [id number] Date of Birth: 1941-05-16  PT Diagnosis: Difficulty walking and Muscle weakness Rehab Potential: Good ELOS: 18-20 days   Today's Date: 08/13/2017 PT Individual Time: 1102-1200 PT Individual Time Calculation (min): 58 min    Problem List:  Patient Active Problem List   Diagnosis Date Noted  . Debility 08/12/2017  . PAF (paroxysmal atrial fibrillation) ([place])   . Pressure injury of skin 08/04/2017  . Tracheostomy in place [facility])   . Acute encephalopathy   . HCAP (healthcare-associated pneumonia)   . Acute respiratory failure ([place])   . RVF (right ventricular failure) ([place])   . RVAD (right ventricular assist device) present ([place])   . Cardiogenic shock ([place])   . S/P AVR 07/10/2017  . Nephrolithiasis 04/07/2017  . Vitamin B 12 deficiency 03/01/2016  . Ascending aortic aneurysm ([place]) 08/20/2015  . Thyroid cancer ([place]) 07/30/2015  . Left thyroid nodule 07/27/2015  . Osteoporosis with fracture 04/16/2014  . At high risk for falls 04/16/2014  . Proximal humerus fracture 05/30/2011  . CHEST PAIN, PRECORDIAL 02/26/2010  . Hyperlipidemia 02/23/2010  . Depression 02/23/2010  . GLAUCOMA 02/23/2010  . CATARACTS 02/23/2010  . RHEUMATIC FEVER 02/23/2010  . Essential hypertension 02/23/2010  . MITRAL VALVE PROLAPSE 02/23/2010  . GERD 02/23/2010  . Osteoarthritis 02/23/2010  . Primary fibromyalgia syndrome 02/23/2010    Past Medical History:  Past Medical History:  Diagnosis Date  . ACL tear    right  Dr. Gladstone Pih   . Adenomatous polyp   . Anxiety   . Aorta aneurysm ([place])   . Arthritis   . Ascending aortic aneurysm ([place])    note per chart per Dr Lucianne Lei Tright 4.7 cm 04/15/2015   . B12 deficiency   . Cancer ([place])   . Cataracts, both eyes 10/2006  . Chronic bronchitis ([place])   . Constipation   . Depression   . DUB (dysfunctional uterine bleeding) 10/96  . ETD (eustachian tube dysfunction)    . Fall   . Fibromyalgia   . GERD (gastroesophageal reflux disease)   . Glaucoma    ([place]) Dr. Arnoldo Morale   . Glaucoma    bilaterally  . Helicobacter pylori (H. pylori)   . Hiatal hernia   . History of bronchitis   . History of frequent urinary tract infections   . History of kidney stones   . History of left shoulder fracture    pt states fell off bed and broke ball in shoulder had rod placed   . Hyperlipidemia   . Hypertension   . Hypothyroidism   . Insomnia   . Leg wound, left    healed   . MVP (mitral valve prolapse)   . Occasional tremors    left arm   . Osteoarthritis   . Osteopenia   . Osteoporosis   . Other and unspecified hyperlipidemia   . Post-menopausal   . Thyroid cancer ([place])   . Thyroid nodule   . Tremor    Past Surgical History:  Past Surgical History:  Procedure Laterality Date  . AORTIC VALVE REPLACEMENT N/A 07/10/2017   Procedure: AORTIC VALVE REPLACEMENT (AVR);  Surgeon: [name], Collier Salina, MD;  Location: [place];  Service: Open Heart Surgery;  Laterality: N/A;  . APPENDECTOMY    . BUNIONECTOMY  08/1999   right - Dr. Irving Shows   . CHOLECYSTECTOMY  1979  . DILATION AND CURETTAGE OF UTERUS  03/31/95   Dr. Ovid Curd   .  EXPLORATION POST OPERATIVE OPEN HEART N/A 07/11/2017   Procedure: EXPLORATION POST OPERATIVE OPEN HEART;  Surgeon: Ivin Poot, MD;  Location: [place];  Service: Open Heart Surgery;  Laterality: N/A;  . EYE SURGERY     also had left cataract removed   . FRACTURE SURGERY    . HEMATOMA EVACUATION N/A 07/11/2017   Procedure: EVACUATION HEMATOMA;  Surgeon: Ivin Poot, MD;  Location: [place];  Service: Open Heart Surgery;  Laterality: N/A;  . HERNIA REPAIR    . NEPHROLITHOTOMY Left 04/07/2017   Procedure: NEPHROLITHOTOMY PERCUTANEOUS WITH SURGEON ACCESS;  Surgeon: Ardis Hughs, MD;  Location: [facility];  Service: Urology;  Laterality: Left;  . ORIF HUMERUS FRACTURE  05/30/2011   Procedure: OPEN REDUCTION INTERNAL FIXATION (ORIF) PROXIMAL  HUMERUS FRACTURE;  Surgeon: Augustin Schooling;  Location: [place];  Service: Orthopedics;  Laterality: Left;  open reduction internal fixation of proximal humerus fracture  . PLACEMENT OF CENTRIMAG VENTRICULAR ASSIST DEVICE Right 07/11/2017   Procedure: PLACEMENT OF CENTRIMAG VENTRICULAR ASSIST DEVICE;  Surgeon: Ivin Poot, MD;  Location: [place];  Service: Open Heart Surgery;  Laterality: Right;  . REMOVAL OF CENTRIMAG VENTRICULAR ASSIST DEVICE N/A 07/17/2017   Procedure: REMOVAL OF RVAD WITH PUMP STANDBY;  Surgeon: Ivin Poot, MD;  Location: [place];  Service: Open Heart Surgery;  Laterality: N/A;  . REPLACEMENT ASCENDING AORTA N/A 07/10/2017   Procedure: REPLACEMENT ASCENDING AORTA;  Surgeon: Ivin Poot, MD;  Location: [place];  Service: Open Heart Surgery;  Laterality: N/A;  . right knee replacement  3/11   Dr. Gladstone Pih  . RIGHT/LEFT HEART CATH AND CORONARY ANGIOGRAPHY N/A 06/13/2017   Procedure: RIGHT/LEFT HEART CATH AND CORONARY ANGIOGRAPHY;  Surgeon: Jolaine Artist, MD;  Location: [facility];  Service: Cardiovascular;  Laterality: N/A;  . rt. eye cataract  05/28/07  . TEE WITHOUT CARDIOVERSION N/A 07/10/2017   Procedure: TRANSESOPHAGEAL ECHOCARDIOGRAM (TEE);  Surgeon: [name], Collier Salina, MD;  Location: [place];  Service: Open Heart Surgery;  Laterality: N/A;  . TEE WITHOUT CARDIOVERSION  07/11/2017   Procedure: TRANSESOPHAGEAL ECHOCARDIOGRAM (TEE);  Surgeon: [name], Collier Salina, MD;  Location: [place];  Service: Open Heart Surgery;;  . TEE WITHOUT CARDIOVERSION N/A 07/17/2017   Procedure: TRANSESOPHAGEAL ECHOCARDIOGRAM (TEE);  Surgeon: [name], Collier Salina, MD;  Location: [place];  Service: Open Heart Surgery;  Laterality: N/A;  . THYROID LOBECTOMY Left 07/27/2015   Procedure: LEFT THYROID LOBECTOMY;  Surgeon: Fanny Skates, MD;  [facility];  Service: General;  Laterality: Left;  . THYROIDECTOMY N/A 08/06/2015   Procedure: RIGHT THYROID LOBECTOMY, REIMPLANTATION PARATHYROID;  Surgeon:  Fanny Skates, MD;  [facility];  Service: General;  Laterality: N/A;  . VENTRAL HERNIA REPAIR  2/99    Assessment & Plan Clinical Impression: Patient is a 77 y.o. year old female  who is been followed by cardiothoracic surgery for a fusiform ascending aneurysm which has increased in size over the past 1-2 years with associated moderate aortic insufficiency. Per chart review patient lives with spouse. Independent with assistive device using a cane prior to admission. One level home 4 steps to entry. Spouse can assist as needed. Presented 07/09/2017 due to increasing size of a ascending aneurysm and underwent repair of ascending aneurysm as well as aortic valve replacement 07/10/2017 per Dr. Nils Pyle. Postoperative ventilatory support. Findings a small amount of anterior mediastinal hematoma as well as orthostasis requiring pressor support. Her urine output decreased concern for tamponade and return  back to the operating room for reexploration to remove any blood/hematoma 07/11/2017 as well as findings RV function significantly decreased from earlier postoperative status and underwent implantation of percutaneous right ventricular assist device. Hospital course ventilatory support underwent tracheostomy 07/25/2017 per Dr. Nelda Marseille and has since been down sized to a #4 cuff less 08/08/2017 and tolerating PMV. No current plan to decannulate as did not tolerate capping trial. Hospital course acute blood loss anemia. Bouts of atrial fibrillation followed by cardiology services and currently maintained on amiodarone. Currently maintained on subcutaneous Lovenox for DVT prophylaxis. Bouts of hypokalemia supplement added. Dysphagia #1 pudding liquids and nasogastric tube feeds for nutritional support. Physical as well as occupational therapy evaluations completed 07/19/2017. Request made for physical medicine rehabilitation consult. Patient was admitted for a comprehensive rehabilitation program  Patient  transferred to CIR on 08/12/2017 .   Patient currently requires max with mobility secondary to muscle weakness, decreased cardiorespiratoy endurance and decreased oxygen support, decreased coordination and decreased standing balance, decreased postural control and decreased balance strategies.  Prior to hospitalization, patient was modified independent  with mobility and lived with Spouse in a Mobile home home.  Home access is 4Stairs to enter.  Patient will benefit from skilled PT intervention to maximize safe functional mobility, minimize fall risk and decrease caregiver burden for planned discharge home with intermittent assist.  Anticipate patient will benefit from follow up [facility] at discharge.  PT - End of Session Activity Tolerance: Tolerates 10 - 20 min activity with multiple rests Endurance Deficit: Yes PT Assessment Rehab Potential (ACUTE/IP ONLY): Good PT Barriers to Discharge: Inaccessible home environment;Decreased caregiver support;New oxygen PT Patient demonstrates impairments in the following area(s): Balance;Endurance;Motor;Nutrition;Pain;Safety PT Transfers Functional Problem(s): Bed to Chair;Car;Furniture;Bed Mobility PT Locomotion Functional Problem(s): Ambulation;Wheelchair Mobility;Stairs PT Plan PT Intensity: Minimum of 1-2 x/day ,45 to 90 minutes PT Frequency: 5 out of 7 days PT Duration Estimated Length of Stay: 18-20 days PT Treatment/Interventions: Discharge planning;Ambulation/gait training;Functional mobility training;Therapeutic Activities;Balance/vestibular training;Disease management/prevention;Neuromuscular re-education;Skin care/wound management;Wheelchair propulsion/positioning;Therapeutic Exercise;Cognitive remediation/compensation;DME/adaptive equipment instruction;Splinting/orthotics;UE/LE Strength taining/ROM;Community reintegration;Patient/family education;Stair training;UE/LE Coordination activities PT Transfers Anticipated Outcome(s): supervision PT  Locomotion Anticipated Outcome(s): supervision PT Recommendation Follow Up Recommendations: Home health PT Patient destination: Home Equipment Recommended: To be determined  Skilled Therapeutic Intervention Evaluation completed (see details above and below) with education on PT POC and goals and individual treatment initiated with focus on functional transfers, bed mobility, and reinforcement of sternal precautions. Pt supine in bed upon PT arrival, agreeable to therapy tx and denies pain. Pt incontinent of bladder and performed rolling in both directions with mod assist in order to maintain sternal precautions, therapist cleaned peri area total assist and donned clean brief total assist. Pt transferred from supine>sitting EOB with max assist in order to maintain precautions. Pt seated EOB x 3 minutes with stand by assist for static balance and min assist for dynamic seated balance. Pt requiring max assist for sit<>stand to boost up, no UE support secondary to sternal precautions. Pt used RW in standing for balance, increased posterior lean requiring mod-max assist for dynamic standing balance. Pt performed stand pivot transfer with mod assist using RW. Pt ambulated x 5 ft with max assist using RW, limited by poor endurance and pt self limiting, reporting "I just can't go further."  Pt unable to propel using B UEs (sternal precautions), w/c to tall for LE propulsion, transported in w/c total assist. Therapist provided pt with pudding thick apple juice and provided supervision for pt to drink. Pt left seated in  w/c at end of session with needs in reach.    PT Evaluation Precautions/Restrictions Precautions Precautions: Fall;Sternal Precaution Comments: Reinforced sternal precautions Restrictions Weight Bearing Restrictions: No Other Position/Activity Restrictions: sternal precautions General   Vital SignsTherapy Vitals Pulse Rate: 89 Resp: 19 Oxygen Therapy SpO2: 99 % O2 Device: Tracheostomy  Collar O2 Flow Rate (L/min): 5 L/min FiO2 (%): 28 % Home Living/Prior Functioning Home Living Available Help at Discharge: Family;Available 24 hours/day Type of Home: Mobile home Home Access: Stairs to enter Entrance Stairs-Number of Steps: 4 Entrance Stairs-Rails: Right;Left;Can reach both Home Layout: One level  Lives With: Spouse Prior Function Level of Independence: Requires assistive device for independence  Able to Take Stairs?: Yes Driving: No Comments: walked with cane, sponge bathed and washed hair over sink, worked together with husband on housekeeping and cooking, drives rarely Cognition Overall Cognitive Status: Impaired/Different from baseline Orientation Level: Oriented X4 Attention: Sustained;Focused Focused Attention: Appears intact Sustained Attention: Appears intact Awareness: Impaired Safety/Judgment: Impaired Sensation Sensation Light Touch: Appears Intact Proprioception: Appears Intact Additional Comments: sensation intact B LEs Coordination Gross Motor Movements are Fluid and Coordinated: No Fine Motor Movements are Fluid and Coordinated: No Coordination and Movement Description: Coordination impaired B LEs secondary to weakness Motor  Motor Motor - Skilled Clinical Observations: generalized weakness  Trunk/Postural Assessment  Cervical Assessment Cervical Assessment: Within Functional Limits Thoracic Assessment Thoracic Assessment: Exceptions to WFL(kyphotic) Lumbar Assessment Lumbar Assessment: Exceptions to WFL(posteior pelvic tilt) Postural Control Postural Control: Deficits on evaluation Trunk Control: posteior lean Protective Responses: impaired  Balance Balance Balance Assessed: Yes Static Sitting Balance Static Sitting - Level of Assistance: 4: Min assist Dynamic Sitting Balance Dynamic Sitting - Level of Assistance: 4: Min assist Static Standing Balance Static Standing - Level of Assistance: 3: Mod assist(posteior lean) Dynamic  Standing Balance Dynamic Standing - Level of Assistance: 2: Max assist(posteior lean) Extremity Assessment  RLE Assessment RLE Assessment: Exceptions to WFL(strength grossly 3+ to 4/5 throughout, DF to neutral) LLE Assessment LLE Assessment: Exceptions to WFL(strength grossly 3+ to 4/5 throughout, DF to neutral)   See Function Navigator for Current Functional Status.   Refer to Care Plan for Long Term Goals  Recommendations for other services: None   Discharge Criteria: Patient will be discharged from PT if patient refuses treatment 3 consecutive times without medical reason, if treatment goals not met, if there is a change in medical status, if patient makes no progress towards goals or if patient is discharged from hospital.  The above assessment, treatment plan, treatment alternatives and goals were discussed and mutually agreed upon: by patient  Netta Corrigan, PT, DPT 08/13/2017, 12:05 PM

## 2017-08-14 ENCOUNTER — Inpatient Hospital Stay (HOSPITAL_COMMUNITY): Payer: Medicare Other

## 2017-08-14 ENCOUNTER — Inpatient Hospital Stay (HOSPITAL_COMMUNITY): Payer: Medicare Other | Admitting: Physical Therapy

## 2017-08-14 ENCOUNTER — Inpatient Hospital Stay (HOSPITAL_COMMUNITY): Payer: Medicare Other | Admitting: Speech Pathology

## 2017-08-14 LAB — CBC WITH DIFFERENTIAL/PLATELET
BASOS ABS: 0 10*3/uL (ref 0.0–0.1)
Basophils Relative: 0 %
Eosinophils Absolute: 0.1 10*3/uL (ref 0.0–0.7)
Eosinophils Relative: 2 %
HEMATOCRIT: 38.6 % (ref 36.0–46.0)
Hemoglobin: 11.9 g/dL — ABNORMAL LOW (ref 12.0–15.0)
Lymphocytes Relative: 28 %
Lymphs Abs: 1.9 10*3/uL (ref 0.7–4.0)
MCH: 30.7 pg (ref 26.0–34.0)
MCHC: 30.8 g/dL (ref 30.0–36.0)
MCV: 99.7 fL (ref 78.0–100.0)
MONO ABS: 0.3 10*3/uL (ref 0.1–1.0)
Monocytes Relative: 5 %
Neutro Abs: 4.4 10*3/uL (ref 1.7–7.7)
Neutrophils Relative %: 65 %
Platelets: 209 10*3/uL (ref 150–400)
RBC: 3.87 MIL/uL (ref 3.87–5.11)
RDW: 16.5 % — AB (ref 11.5–15.5)
WBC: 6.8 10*3/uL (ref 4.0–10.5)

## 2017-08-14 LAB — COMPREHENSIVE METABOLIC PANEL
ALT: 65 U/L — AB (ref 14–54)
AST: 54 U/L — AB (ref 15–41)
Albumin: 3 g/dL — ABNORMAL LOW (ref 3.5–5.0)
Alkaline Phosphatase: 181 U/L — ABNORMAL HIGH (ref 38–126)
Anion gap: 12 (ref 5–15)
BILIRUBIN TOTAL: 0.6 mg/dL (ref 0.3–1.2)
BUN: 33 mg/dL — AB (ref 6–20)
CALCIUM: 7.9 mg/dL — AB (ref 8.9–10.3)
CO2: 32 mmol/L (ref 22–32)
CREATININE: 0.61 mg/dL (ref 0.44–1.00)
Chloride: 97 mmol/L — ABNORMAL LOW (ref 101–111)
GFR calc Af Amer: >60 mL/min (ref >60)
Glucose, Bld: 129 mg/dL — ABNORMAL HIGH (ref 65–99)
Potassium: 3.7 mmol/L (ref 3.5–5.1)
Sodium: 141 mmol/L (ref 135–145)
TOTAL PROTEIN: 6.4 g/dL — AB (ref 6.5–8.1)

## 2017-08-14 LAB — GLUCOSE, CAPILLARY
GLUCOSE-CAPILLARY: 126 mg/dL — AB (ref 65–99)
GLUCOSE-CAPILLARY: 93 mg/dL (ref 65–99)
Glucose-Capillary: 106 mg/dL — ABNORMAL HIGH (ref 65–99)
Glucose-Capillary: 98 mg/dL (ref 65–99)
Glucose-Capillary: 99 mg/dL (ref 65–99)
Glucose-Capillary: 102 mg/dL — ABNORMAL HIGH (ref 65–99)
Glucose-Capillary: 150 mg/dL — ABNORMAL HIGH (ref 65–99)
Glucose-Capillary: 67 mg/dL (ref 65–99)

## 2017-08-14 MED ORDER — PRO-STAT SUGAR FREE PO LIQD
30.0000 mL | Freq: Four times a day (QID) | ORAL | Status: DC
  Administered 2017-08-14 – 2017-08-17 (×10): 30 mL
  Filled 2017-08-14 (×10): qty 30

## 2017-08-14 MED ORDER — FREE WATER: 200.0000 mL | Freq: Three times a day (TID) | Status: DC

## 2017-08-14 MED ORDER — FREE WATER
200.0000 mL | Freq: Four times a day (QID) | Status: DC
  Administered 2017-08-14 – 2017-08-16 (×8): 200 mL

## 2017-08-14 NOTE — Evaluation (Signed)
Speech Language Pathology Assessment and Plan  Patient Details  Name: [name] MRN: [id number] Date of Birth: February 17, 1941  SLP Diagnosis: Cognitive Impairments;Dysphagia  Rehab Potential: Good ELOS: 3 weeks    Today's Date: 08/14/2017 SLP Individual Time: 1300-1400 SLP Individual Time Calculation (min): 60 min   Problem List:  Patient Active Problem List   Diagnosis Date Noted  . Debility 08/12/2017  . PAF (paroxysmal atrial fibrillation) ([place])   . Pressure injury of skin 08/04/2017  . Tracheostomy in place [facility] [place])   . Acute encephalopathy   . HCAP (healthcare-associated pneumonia)   . Acute respiratory failure ([place])   . RVF (right ventricular failure) ([place])   . RVAD (right ventricular assist device) present ([place])   . Cardiogenic shock ([place])   . S/P AVR 07/10/2017  . Nephrolithiasis 04/07/2017  . Vitamin B 12 deficiency 03/01/2016  . Ascending aortic aneurysm ([place]) 08/20/2015  . Thyroid cancer ([place]) 07/30/2015  . Left thyroid nodule 07/27/2015  . Osteoporosis with fracture 04/16/2014  . At high risk for falls 04/16/2014  . Proximal humerus fracture 05/30/2011  . CHEST PAIN, PRECORDIAL 02/26/2010  . Hyperlipidemia 02/23/2010  . Depression 02/23/2010  . GLAUCOMA 02/23/2010  . CATARACTS 02/23/2010  . RHEUMATIC FEVER 02/23/2010  . Essential hypertension 02/23/2010  . MITRAL VALVE PROLAPSE 02/23/2010  . GERD 02/23/2010  . Osteoarthritis 02/23/2010  . Primary fibromyalgia syndrome 02/23/2010   Past Medical History:  Past Medical History:  Diagnosis Date  . ACL tear    right  Dr. Gladstone Pih   . Adenomatous polyp   . Anxiety   . Aorta aneurysm ([place])   . Arthritis   . Ascending aortic aneurysm ([place])    note per chart per Dr Lucianne Lei Tright 4.7 cm 04/15/2015   . B12 deficiency   . Cancer ([place])   . Cataracts, both eyes 10/2006  . Chronic bronchitis ([place])   . Constipation   . Depression   . DUB (dysfunctional uterine bleeding) 10/96  . ETD (eustachian tube dysfunction)    . Fall   . Fibromyalgia   . GERD (gastroesophageal reflux disease)   . Glaucoma    ([place]) Dr. Arnoldo Morale   . Glaucoma    bilaterally  . Helicobacter pylori (H. pylori)   . Hiatal hernia   . History of bronchitis   . History of frequent urinary tract infections   . History of kidney stones   . History of left shoulder fracture    pt states fell off bed and broke ball in shoulder had rod placed   . Hyperlipidemia   . Hypertension   . Hypothyroidism   . Insomnia   . Leg wound, left    healed   . MVP (mitral valve prolapse)   . Occasional tremors    left arm   . Osteoarthritis   . Osteopenia   . Osteoporosis   . Other and unspecified hyperlipidemia   . Post-menopausal   . Thyroid cancer ([place])   . Thyroid nodule   . Tremor    Past Surgical History:  Past Surgical History:  Procedure Laterality Date  . AORTIC VALVE REPLACEMENT N/A 07/10/2017   Procedure: AORTIC VALVE REPLACEMENT (AVR);  Surgeon: [name], Collier Salina, MD;  Location: [facility];  Service: Open Heart Surgery;  Laterality: N/A;  . APPENDECTOMY    . BUNIONECTOMY  08/1999   right - Dr. Irving Shows   . CHOLECYSTECTOMY  1979  . DILATION AND CURETTAGE OF UTERUS  03/31/95   Dr. Ovid Curd   .  EXPLORATION POST OPERATIVE OPEN HEART N/A 07/11/2017   Procedure: EXPLORATION POST OPERATIVE OPEN HEART;  Surgeon: Ivin Poot, MD;  Location: [facility];  Service: Open Heart Surgery;  Laterality: N/A;  . EYE SURGERY     also had left cataract removed   . FRACTURE SURGERY    . HEMATOMA EVACUATION N/A 07/11/2017   Procedure: EVACUATION HEMATOMA;  Surgeon: Ivin Poot, MD;  Location: [facility];  Service: Open Heart Surgery;  Laterality: N/A;  . HERNIA REPAIR    . NEPHROLITHOTOMY Left 04/07/2017   Procedure: NEPHROLITHOTOMY PERCUTANEOUS WITH SURGEON ACCESS;  Surgeon: Ardis Hughs, MD;  Location: [facility];  Service: Urology;  Laterality: Left;  . ORIF HUMERUS FRACTURE  05/30/2011   Procedure: OPEN REDUCTION INTERNAL FIXATION (ORIF) PROXIMAL  HUMERUS FRACTURE;  Surgeon: Augustin Schooling;  Location: [facility];  Service: Orthopedics;  Laterality: Left;  open reduction internal fixation of proximal humerus fracture  . PLACEMENT OF CENTRIMAG VENTRICULAR ASSIST DEVICE Right 07/11/2017   Procedure: PLACEMENT OF CENTRIMAG VENTRICULAR ASSIST DEVICE;  Surgeon: Ivin Poot, MD;  Location: [place] [place];  Service: Open Heart Surgery;  Laterality: Right;  . REMOVAL OF CENTRIMAG VENTRICULAR ASSIST DEVICE N/A 07/17/2017   Procedure: REMOVAL OF RVAD WITH PUMP STANDBY;  Surgeon: Ivin Poot, MD;  Location: [facility];  Service: Open Heart Surgery;  Laterality: N/A;  . REPLACEMENT ASCENDING AORTA N/A 07/10/2017   Procedure: REPLACEMENT ASCENDING AORTA;  Surgeon: Ivin Poot, MD;  Location: [facility];  Service: Open Heart Surgery;  Laterality: N/A;  . right knee replacement  3/11   Dr. Gladstone Pih  . RIGHT/LEFT HEART CATH AND CORONARY ANGIOGRAPHY N/A 06/13/2017   Procedure: RIGHT/LEFT HEART CATH AND CORONARY ANGIOGRAPHY;  Surgeon: Jolaine Artist, MD;  Location: [facility];  Service: Cardiovascular;  Laterality: N/A;  . rt. eye cataract  05/28/07  . TEE WITHOUT CARDIOVERSION N/A 07/10/2017   Procedure: TRANSESOPHAGEAL ECHOCARDIOGRAM (TEE);  Surgeon: [name], Collier Salina, MD;  Location: [facility];  Service: Open Heart Surgery;  Laterality: N/A;  . TEE WITHOUT CARDIOVERSION  07/11/2017   Procedure: TRANSESOPHAGEAL ECHOCARDIOGRAM (TEE);  Surgeon: [name], Collier Salina, MD;  Location: [facility];  Service: Open Heart Surgery;;  . TEE WITHOUT CARDIOVERSION N/A 07/17/2017   Procedure: TRANSESOPHAGEAL ECHOCARDIOGRAM (TEE);  Surgeon: [name], Collier Salina, MD;  Location: [facility];  Service: Open Heart Surgery;  Laterality: N/A;  . THYROID LOBECTOMY Left 07/27/2015   Procedure: LEFT THYROID LOBECTOMY;  Surgeon: Fanny Skates, MD;  [facility];  Service: General;  Laterality: Left;  . THYROIDECTOMY N/A 08/06/2015   Procedure: RIGHT THYROID LOBECTOMY, REIMPLANTATION PARATHYROID;  Surgeon:  Fanny Skates, MD;  [facility];  Service: General;  Laterality: N/A;  . VENTRAL HERNIA REPAIR  2/99    Assessment / Plan / Recommendation Clinical Impression [name] is a 77 y.o. right handed female who is been followed by cardiothoracic surgery for a fusiform ascending aneurysm which has increased in size over the past 1-2 years with associated moderate aortic insufficiency. Per chart review patient lives with spouse. Independent with assistive device using a cane prior to admission. One level home 4 steps to entry. Spouse can assist as needed. Presented 07/09/2017 due to increasing size of a ascending aneurysm and underwent repair of ascending aneurysm as well as aortic valve replacement 07/10/2017 per Dr. Nils Pyle. Postoperative ventilatory support. Findings a small amount of anterior mediastinal hematoma as well as orthostasis requiring pressor support. Her urine output decreased concern for  tamponade and return back to the operating room for reexploration to remove any blood/hematoma 07/11/2017 as well as findings RV function significantly decreased from earlier postoperative status and underwent implantation of percutaneous right ventricular assist device. Hospital course ventilatory support underwent tracheostomy 07/25/2017 per Dr. Nelda Marseille and has since been down sized to a #4 cuff less 08/08/2017 and tolerating PMV. No current plan to decannulate as did not tolerate capping trial. Hospital course acute blood loss anemia. Bouts of atrial fibrillation followed by cardiology services and currently maintained on amiodarone. Currently maintained on subcutaneous Lovenox for DVT prophylaxis. Bouts of hypokalemia supplement added. Dysphagia #1 pudding liquids and nasogastric tube feeds for nutritional support. Physical as well as occupational therapy evaluations completed 07/19/2017. Request made for physical medicine rehabilitation consult. Patient was admitted for a comprehensive rehabilitation  program.   Comprehensive bedside swallow, passy muir speaking valve and cognitive linguistic evaluations performed on 08/14/17. Pt tolerating PMV without any decline in vitals during all therapies with full supervision d/t cognitive deficits. Secretions at trach hub and coughing x 1 with PMV coming off of hub and pt unaware of difference in sensation. Pt's voice is hoarse but her speech is fully intelligible.   Chart review indicates that pt with silent penetration of all consistencies (honey and nectar) on last MBS with overt s/s of aspiration on nectar (most recent ST note). Pt with order in chart (given Sunday) for ice chips and pt requesting therefore, ice chips were administered during BSE this session. Pt with order in chart (given Sunday) for ice chips and pt requesting therefore, ice chips were administered during BSE this session. Pt with cough x 1 and was very inattentive to cues for hard swallow or throat clear d/t fixation on having regular food to eat. Husband pt further provide that MD allowed ice chips to encourage pt to eat more food. Given pt's cognitive deficits, this hasn't worked as pt hasn't increased her intake. Trials of ice chips are not indicated at this time (except with SLP)until further instrumental study is performed as pt had silent penetration without ejection of penetrates with honey and nectar placing pt at high risk for aspirating of penetrates.   Lastly, pt demonstrates significantly cognitive impairments as evidenced by score of 11 out of 30 on MOCA version 7.1 (n=>26). Pt impairments afftecting recall of information, awareness, sustained attention and basic problem solving. Skilled ST is required to target the above mentioned deficits, increase functional independence and reduce caregiver burden. Anticipate that pt will require 24 hour supervision and follow up [place] at discharge.     Skilled Therapeutic Interventions          Skilled treatment session focused on  completion of PMSV, BSE and cognitive linguistic evaluations, see above. Pt required Total A to Max A cues for awareness and recall of current diet, need for current diet and function of trach/PMSV. Extensive education provided to pt and husband on POC to include repeat instrumental study prior to upgrade to allow ice chips etc.    SLP Assessment  Patient will need skilled Speech Lanaguage Pathology Services during CIR admission    Recommendations  Patient may use Passy-Muir Speech Valve: During all therapies with supervision PMSV Supervision: Full SLP Diet Recommendations: Dysphagia 1 (Puree);Pudding Liquid Administration via: Spoon Medication Administration: Crushed with puree Supervision: Patient able to self feed;Full supervision/cueing for compensatory strategies Compensations: Minimize environmental distractions;Slow rate;Small sips/bites Postural Changes and/or Swallow Maneuvers: Seated upright 90 degrees Oral Care Recommendations: Oral care BID Patient destination: Home  Follow up Recommendations: 24 hour supervision/assistance;Outpatient SLP    SLP Frequency 3 to 5 out of 7 days   SLP Duration  SLP Intensity  SLP Treatment/Interventions 3 weeks  Minumum of 1-2 x/day, 30 to 90 minutes  Cognitive remediation/compensation;Cueing hierarchy;Dysphagia/aspiration precaution training;Functional tasks;Patient/family education;Therapeutic Activities;Speech/Language facilitation;Internal/external aids;Environmental controls    Pain Pain Assessment Pain Assessment: No/denies pain  Prior Functioning Cognitive/Linguistic Baseline: Within functional limits Type of Home: Mobile home  Lives With: Spouse Available Help at Discharge: Family;Available 24 hours/day Vocation: Retired  Function:  Eating Eating   Modified Consistency Diet: No(Trials of ice chips with SLP only) Eating Assist Level: Supervision or verbal cues           Cognition Comprehension Comprehension assist  level: Understands basic 25 - 49% of the time/ requires cueing 50 - 75% of the time  Expression   Expression assist level: Expresses basic 75 - 89% of the time/requires cueing 10 - 24% of the time. Needs helper to occlude trach/needs to repeat words.  Social Interaction Social Interaction assist level: Interacts appropriately 50 - 74% of the time - May be physically or verbally inappropriate.  Problem Solving Problem solving assist level: Solves basic 25 - 49% of the time - needs direction more than half the time to initiate, plan or complete simple activities  Memory Memory assist level: Recognizes or recalls less than 25% of the time/requires cueing greater than 75% of the time   Short Term Goals: Week 1: SLP Short Term Goal 1 (Week 1): Pt will donn and doff PMSV with Max A cues.  SLP Short Term Goal 2 (Week 1): Pt will utilize external memory aid to recall basic information with Max A cues (such as swallowing/diet recommendation information).  SLP Short Term Goal 3 (Week 1): Pt will demonstrate basic problem solving with familiar tasks and Mod A cues.  SLP Short Term Goal 4 (Week 1): Pt will demonstrate sustained attention for ~ 30 minutes with supervision cues.  SLP Short Term Goal 5 (Week 1): Pt will consume ice chips without overt s/s of aspiration to demonstrate readiness for repeat swallow study.   Refer to Care Plan for Long Term Goals  Recommendations for other services: None   Discharge Criteria: Patient will be discharged from SLP if patient refuses treatment 3 consecutive times without medical reason, if treatment goals not met, if there is a change in medical status, if patient makes no progress towards goals or if patient is discharged from hospital.  The above assessment, treatment plan, treatment alternatives and goals were discussed and mutually agreed upon: by patient and by family  Dianara Smullen 08/14/2017, 5:33 PM

## 2017-08-14 NOTE — Progress Notes (Signed)
Physical Medicine and Rehabilitation Consult   Reason for Consult: Decreased functional mobility  Referring Physician: Dr. Gwenyth Bender   HPI: [name] is a 77 y.o. right handed female who is been followed by cardiothoracic surgery for a fusiform ascending aneurysm which has increased in size over the past 1-2 years with associated moderate aortic insufficiency. Per chart review patient lives with spouse. Independent with assistive device using a cane prior to admission. One level home 4 steps to entry. Spouse can assist as needed. Presented 07/09/2017 due to increasing size of a ascending aneurysm and underwent repair of ascending aneurysm as well as aortic valve replacement 07/10/2017 per Dr. Nils Pyle. Postoperative ventilatory support. Findings a small amount of anterior mediastinal hematoma as well as orthostasis requiring pressor support. Her urine output decreased concern for tamponade and return back to the operating room for reexploration to remove any blood/hematoma 07/11/2017 as well as findings RV function significantly decreased from earlier postoperative status and underwent implantation of percutaneous right ventricular assist device. Hospital course acute blood loss anemia. Bouts of atrial fibrillation followed by cardiology services. Currently maintained on subcutaneous Lovenox for DVT prophylaxis. Bouts of hypokalemia supplement added. Await formal swallow study. She was extubated 07/19/2017. Physical as well as occupational therapy evaluations completed 07/19/2017. Request made for physical medicine rehabilitation consult  Review of Systems  Constitutional: Negative for chills and fever.  HENT: Negative for hearing loss.  Eyes: Negative for blurred vision and double vision.  Respiratory: Positive for shortness of breath. Negative for cough.  Cardiovascular: Positive for palpitations and leg swelling.  Gastrointestinal: Positive for constipation. Negative for nausea and vomiting.    GERD  Genitourinary: Negative for dysuria, flank pain and hematuria.  Musculoskeletal: Positive for joint pain and myalgias.  Skin: Negative for rash.  Neurological: Positive for tremors.  Psychiatric/Behavioral: Positive for depression. The patient has insomnia.  Anxiety  All other systems reviewed and are negative.       Past Medical History:  Diagnosis Date  . ACL tear    right Dr. Gladstone Pih   . Adenomatous polyp   . Anxiety   . Aorta aneurysm ([place])   . Arthritis   . Ascending aortic aneurysm ([place])    note per chart per Dr Lucianne Lei Tright 4.7 cm 04/15/2015   . B12 deficiency   . Cancer ([place])   . Cataracts, both eyes 10/2006  . Chronic bronchitis ([place])   . Constipation   . Depression   . DUB (dysfunctional uterine bleeding) 10/96  . ETD (eustachian tube dysfunction)   . Fall   . Fibromyalgia   . GERD (gastroesophageal reflux disease)   . Glaucoma    ([place]) Dr. Arnoldo Morale   . Glaucoma    bilaterally  . Helicobacter pylori (H. pylori)   . Hiatal hernia   . History of bronchitis   . History of frequent urinary tract infections   . History of kidney stones   . History of left shoulder fracture    pt states fell off bed and broke ball in shoulder had rod placed   . Hyperlipidemia   . Hypertension   . Hypothyroidism   . Insomnia   . Leg wound, left    healed   . MVP (mitral valve prolapse)   . Occasional tremors    left arm   . Osteoarthritis   . Osteopenia   . Osteoporosis   . Other and unspecified hyperlipidemia   . Post-menopausal   . Thyroid cancer ([place])   .  Thyroid nodule   . Tremor         Past Surgical History:  Procedure Laterality Date  . AORTIC VALVE REPLACEMENT N/A 07/10/2017   Procedure: AORTIC VALVE REPLACEMENT (AVR); Surgeon: [name], Collier Salina, MD; Location: [place]; Service: Open Heart Surgery; Laterality: N/A;  . APPENDECTOMY    . BUNIONECTOMY  08/1999   right - Dr. Irving Shows   . CHOLECYSTECTOMY  1979  . DILATION AND CURETTAGE OF UTERUS  03/31/95   Dr.  Ovid Curd   . EXPLORATION POST OPERATIVE OPEN HEART N/A 07/11/2017   Procedure: EXPLORATION POST OPERATIVE OPEN HEART; Surgeon: Ivin Poot, MD; Location: [place]; Service: Open Heart Surgery; Laterality: N/A;  . EYE SURGERY     also had left cataract removed   . FRACTURE SURGERY    . HEMATOMA EVACUATION N/A 07/11/2017   Procedure: EVACUATION HEMATOMA; Surgeon: Ivin Poot, MD; Location: [place]; Service: Open Heart Surgery; Laterality: N/A;  . HERNIA REPAIR    . NEPHROLITHOTOMY Left 04/07/2017   Procedure: NEPHROLITHOTOMY PERCUTANEOUS WITH SURGEON ACCESS; Surgeon: Ardis Hughs, MD; Location[facility]; Service: Urology; Laterality: Left;  . ORIF HUMERUS FRACTURE  05/30/2011   Procedure: OPEN REDUCTION INTERNAL FIXATION (ORIF) PROXIMAL HUMERUS FRACTURE; Surgeon: Augustin Schooling; Location: [place]; Service: Orthopedics; Laterality: Left; open reduction internal fixation of proximal humerus fracture  . PLACEMENT OF CENTRIMAG VENTRICULAR ASSIST DEVICE Right 07/11/2017   Procedure: PLACEMENT OF CENTRIMAG VENTRICULAR ASSIST DEVICE; Surgeon: Ivin Poot, MD; Location: [place]; Service: Open Heart Surgery; Laterality: Right;  . REMOVAL OF CENTRIMAG VENTRICULAR ASSIST DEVICE N/A 07/17/2017   Procedure: REMOVAL OF RVAD WITH PUMP STANDBY; Surgeon: Ivin Poot, MD; Location: [place]; Service: Open Heart Surgery; Laterality: N/A;  . REPLACEMENT ASCENDING AORTA N/A 07/10/2017   Procedure: REPLACEMENT ASCENDING AORTA; Surgeon: Ivin Poot, MD; Location: [place]; Service: Open Heart Surgery; Laterality: N/A;  . right knee replacement  3/11   Dr. Gladstone Pih  . RIGHT/LEFT HEART CATH AND CORONARY ANGIOGRAPHY N/A 06/13/2017   Procedure: RIGHT/LEFT HEART CATH AND CORONARY ANGIOGRAPHY; Surgeon: Jolaine Artist, MD; Location: [place] [facility]; Service: Cardiovascular; Laterality: N/A;  . rt. eye cataract  05/28/07  . TEE WITHOUT CARDIOVERSION N/A 07/10/2017   Procedure: TRANSESOPHAGEAL ECHOCARDIOGRAM  (TEE); Surgeon: [name], Collier Salina, MD; Location: [place]; Service: Open Heart Surgery; Laterality: N/A;  . TEE WITHOUT CARDIOVERSION  07/11/2017   Procedure: TRANSESOPHAGEAL ECHOCARDIOGRAM (TEE); Surgeon: [name], Collier Salina, MD; Location: [place]; Service: Open Heart Surgery;;  . TEE WITHOUT CARDIOVERSION N/A 07/17/2017   Procedure: TRANSESOPHAGEAL ECHOCARDIOGRAM (TEE); Surgeon: [name], Collier Salina, MD; Location: [place]; Service: Open Heart Surgery; Laterality: N/A;  . THYROID LOBECTOMY Left 07/27/2015   Procedure: LEFT THYROID LOBECTOMY; Surgeon: Fanny Skates, MD; [facility]; Service: General; Laterality: Left;  . THYROIDECTOMY N/A 08/06/2015   Procedure: RIGHT THYROID LOBECTOMY, REIMPLANTATION PARATHYROID; Surgeon: Fanny Skates, MD; [facility]; Service: General; Laterality: N/A;  . VENTRAL HERNIA REPAIR  2/99        Family History  Problem Relation Age of Onset  . Cancer Mother    PANCREATIC   . Osteoporosis Mother   . Hip fracture Mother   . Heart attack Father 63   Died 68  . Heart disease Father   . Arthritis Father   . Hypertension Sister   . Cancer Sister    skin  . Hypertension Sister   . Breast cancer Neg Hx    Social History: reports that has never smoked. she has  never used smokeless tobacco. She reports that she does not drink alcohol or use drugs.  Allergies:       Allergies  Allergen Reactions  . Alendronate Sodium Nausea Only  . Levofloxacin Nausea And Vomiting  . Lipitor [Atorvastatin Calcium] Other (See Comments)    myalgias  . Lyrica [Pregabalin] Other (See Comments)    SORES IN MOUTH   . Meloxicam Other (See Comments)    Vaginal itching   . Sibutramine Hcl Monohydrate Other (See Comments)    Reaction unknown  . Actonel [Risedronate] Other (See Comments)    Bone pain and nausea   . Latex Rash    RA to latex 1982; includes bandaids             Facility-Administered Medications Prior to Admission  Medication Dose Route Frequency Provider Last Rate  Last Dose  . cyanocobalamin ((VITAMIN B-12)) injection 1,000 mcg 1,000 mcg Intramuscular Q30 days Chipper Herb, MD  1,000 mcg at 06/07/17 1004         Medications Prior to Admission  Medication Sig Dispense Refill  . aspirin 81 MG tablet Take 1 tablet (81 mg total) by mouth every other day. (Patient taking differently: Take 81 mg by mouth at bedtime. ) 30 tablet   . atenolol (TENORMIN) 50 MG tablet TAKE 1 TABLET DAILY (Patient taking differently: TAKE 1 TABLET BY MOUTH DAILY) 30 tablet 5  . gabapentin (NEURONTIN) 100 MG capsule Take 100 mg by mouth at bedtime.    Marland Kitchen levothyroxine (SYNTHROID, LEVOTHROID) 100 MCG tablet Take 100 mcg by mouth every other day. Alternates and takes 100 mcg one day and 112 mcg the next    . levothyroxine (SYNTHROID, LEVOTHROID) 112 MCG tablet Take 112 mcg by mouth daily before breakfast. Alternates and takes 100 mcg one day and 112 mcg the next  4  . Omega 3 1200 MG CAPS Take 1,200 mg by mouth at bedtime.    . polyethylene glycol (MIRALAX / GLYCOLAX) packet Take 17 g by mouth daily.     Marland Kitchen PROCTOZONE-HC 2.5 % rectal cream APPLY TO RECTALLY 2 TIMES A DAY AS NEEDED (Patient taking differently: APPLY TO RECTALLY 1 TIMES A DAY AT BEDTIME) 30 g 1  . Simethicone (GAS-X PO) Take 1 tablet by mouth daily as needed (GAS).    Marland Kitchen simvastatin (ZOCOR) 80 MG tablet TAKE ONE TABLET AT BEDTIME (Patient taking differently: TAKE ONE HALF (40 MG) TABLET BY MOUTH DAILY) 30 tablet 5  . TRAVATAN Z 0.004 % SOLN ophthalmic solution INSTILL 1 DROP INTO RIGHT EYE AT BEDTIME (Patient taking differently: INSTILL 1 DROP INTO BOTH EYES AT BEDTIME) 2.5 mL 2  . venlafaxine XR (EFFEXOR-XR) 75 MG 24 hr capsule TAKE (1) CAPSULE DAILY 90 capsule 0  . zolpidem (AMBIEN) 10 MG tablet TAKE 1/2 TABLET AT BEDTIME AS NEEDED (Patient taking differently: TAKE 0.5 TABLET (5 MG) BY MOUTH AT BEDTIME.) 45 tablet 2  . celecoxib (CELEBREX) 200 MG capsule TAKE (1) CAPSULE DAILY (Patient not taking: Reported on 06/28/2017)  30 capsule 0  . clobetasol cream (TEMOVATE) 7.37 % Apply 1 application topically 2 (two) times daily. (Patient not taking: Reported on 06/28/2017) 30 g 2  . clorazepate (TRANXENE) 7.5 MG tablet TAKE 1 TABLET DAILY AS NEEDED (Patient taking differently: TAKE 1 TABLET DAILY AS NEEDED FOR PAIN) 30 tablet 2  . docusate sodium (COLACE) 100 MG capsule Take 2 capsules (200 mg total) by mouth 2 (two) times daily. (Patient not taking: Reported on 06/28/2017) 120 capsule  11  . famotidine (PEPCID) 20 MG tablet Take 1 tablet (20 mg total) by mouth 2 (two) times daily. (Patient not taking: Reported on 06/28/2017) 60 tablet 1  . fluticasone (FLONASE) 50 MCG/ACT nasal spray Place 2 sprays into both nostrils daily. (Patient not taking: Reported on 06/28/2017) 16 g 6  . mupirocin ointment (BACTROBAN) 2 % Apply 1 application topically 2 (two) times daily. (Patient not taking: Reported on 06/28/2017) 22 g 1  . traMADol (ULTRAM) 50 MG tablet Take 1-2 tablets (50-100 mg total) by mouth every 6 (six) hours as needed for moderate pain. (Patient not taking: Reported on 06/28/2017) 20 tablet 0  . triamcinolone cream (KENALOG) 0.1 % Apply 1 application topically 3 (three) times daily. Avoid face and genitalia (Patient not taking: Reported on 06/28/2017) 28.4 g 0   Home:  Home Living  Family/patient expects to be discharged to:: Private residence  Living Arrangements: Spouse/significant other  Available Help at Discharge: Family, Available 24 hours/day  Type of Home: Mobile home  Home Access: Stairs to enter  [employer] of Steps: 4  Entrance Stairs-Rails: Right, Left, Can reach both  Home Layout: One level  Bathroom Shower/Tub: Tourist information centre manager: Handicapped height  Home Equipment: [place] - single point  Additional Comments: ? may have 02 in home  Functional History:  Prior Function  Level of Independence: Independent with assistive device(s)  Comments: walked with cane, sponge bathed and washed hair over  sink, worked together with husband on housekeeping and cooking, drives rarely  Functional Status:  Mobility:  Bed Mobility  Overal bed mobility: Needs Assistance  Bed Mobility: Rolling, Sidelying to Sit, Sit to Sidelying  Rolling: Total assist, +2 for physical assistance  Sidelying to sit: Total assist, +2 for physical assistance  Sit to sidelying: Total assist, +2 for physical assistance  General bed mobility comments: instructed in log roll method to minimize abdominal pain  Transfers  Overall transfer level: Needs assistance  Equipment used: 2 person hand held assist  Transfers: Sit to/from Stand  Sit to Stand: Max assist, +2 physical assistance  General transfer comment: pt able to perform brief, partial stand from EOB with assist to rise and steady, cues for hands on knees    ADL:  ADL  General ADL Comments: Pt currently requiring total assist.  Cognition:  Cognition  Overall Cognitive Status: Within Functional Limits for tasks assessed  Orientation Level: Oriented to person, Oriented to time, Disoriented to place, Oriented to situation  Cognition  Arousal/Alertness: Awake/alert  Behavior During Therapy: WFL for tasks assessed/performed  Overall Cognitive Status: Within Functional Limits for tasks assessed  General Comments: somewhat difficult to assess cognition due to low volume  Blood pressure 118/67, pulse 95, temperature 97.7 F (36.5 C), temperature source Oral, resp. rate (!) 32, height 5\' 5"  (1.651 m), weight 88.6 kg (195 lb 5.2 oz), SpO2 92 %.  Physical Exam  Vitals reviewed.  Constitutional: She appears well-developed.  HENT:  Head: Normocephalic.  Eyes: EOM are normal.  Neck: Normal range of motion. Neck supple. No thyromegaly present.  Cardiovascular:  Cardiac rate controlled  Respiratory:  Intubated  GI: Soft. Bowel sounds are normal. She exhibits no distension.  Neurological: She is alert.  Patient sedated and does not respond to verbal commands.  General response to tactile stimulus  Skin:  Midline chest incision clean and dry  Assessment/Plan: Diagnosis: Debility related to abdominal aneurysm/AS s/p repair with subsequent complications.  Patient currently intubated/on the ventilator 1. Does the need for  close, 24 hr/day medical supervision in concert with the patient's rehab needs make it unreasonable for this patient to be served in a less intensive setting? Potentially 2. Co-Morbidities requiring supervision/potential complications: Hypertension, wound care, numerous cardiovascular considerations 3. Due to bladder management, bowel management, safety, skin/wound care, disease management, medication administration, pain management and patient education, does the patient require 24 hr/day rehab nursing? Potentially 4. Does the patient require coordinated care of a physician, rehab nurse, PT (1-2 hrs/day, 5 days/week), OT (1-2 hrs/day, 5 days/week) and SLP (1-2 hrs/day, 5 days/week) to address physical and functional deficits in the context of the above medical diagnosis(es)? Potentially Addressing deficits in the following areas: balance, endurance, locomotion, strength, transferring, bowel/bladder control, bathing, dressing, feeding, grooming, toileting, cognition, swallowing and psychosocial support 5. Can the patient actively participate in an intensive therapy program of at least 3 hrs of therapy per day at least 5 days per week? Potentially 6. The potential for patient to make measurable gains while on inpatient rehab is good 7. Anticipated functional outcomes upon discharge from inpatient rehab are min assist and mod assist  with PT, min assist and mod assist with OT, min assist and mod assist with SLP. 8. Estimated rehab length of stay to reach the above functional goals is: TBD 9. Anticipated D/C setting: Home 10. Anticipated post D/C treatments: N/A 11. Overall Rehab/Functional Prognosis: good and fair  RECOMMENDATIONS: This  patient's condition is appropriate for continued rehabilitative care in the following setting: See below Patient has agreed to participate in recommended program. N/A Note that insurance prior authorization may be required for reimbursement for recommended care.  Comment: Patient currently intubated.  Will follow for medical stability and increased activity tolerance.  Meredith Staggers, MD, [facility] 07/20/2017    Lavon Paganini Angiulli, PA-C 07/20/2017          Revision History                        Routing History

## 2017-08-14 NOTE — Progress Notes (Signed)
Physical Therapy Session Note  Patient Details  Name: [name] MRN: [id number] Date of Birth: 12-20-1940  Today's Date: 08/14/2017 PT Individual Time: 0800-0900 1400-1430 PT Individual Time Calculation (min): 60 min + 60 min 140min total  Short Term Goals: Week 1:  PT Short Term Goal 1 (Week 1): Pt will perform <>stand with mod assist while maintaining sternal precautions PT Short Term Goal 2 (Week 1): Pt will perform stand pivot transfer with min assist and use of RW PT Short Term Goal 3 (Week 1): Pt will ambualted x 50 ft with RW and mod assist  Skilled Therapeutic Interventions/Progress Updates:    Tx 1:Pt was greeted lying supine in bed, agreeable to therapy, reports needs to use toilet and wash face before getting started. Total A to don socks and pants in supine, pt able to bridge from bed for therapist to pull pants around hips. Therapist disconnected wall oxygen and connected portable oxygen at 4L by tracheostomy. Pt transitions supine to sit with modA +1 for trunk, shoes and shirt donned with totalA sitting EOB, pt able to assist with buttoning 1 shirt button, maintains sitting balance EOB while combing hair and talking to MD. Performs stand pivot transfer bed> using RW with modA +1, bed elevated and 2 trials to complete sit>stand. Stand pivot WC<>toilet using RW with modA +2 for boosting from W/C level, pt needs assistance managing NG tube and oxygen tubing. SatO2 97% sitting on toilet. Sit<>stand from elevated toilet seat with maxA +1,totalA for hygiene tasks, totalA for donning new brief and pulling up pants, pt becomes SOB in standing, satO2 dropped to 75%, O2 increased to 6L with satO2 improving back to 95% within a minute. At end of session, pt left sitting upright in WC with wall O2 reconnected at 5L, all needs within reach.  Tx 2: Pt greeted sitting upright in manual WC with husband present, agreeable to therapy but reports fatigue. Nursing staff present to flush NG tube however  left disconnected d/t clogging of tube. Pt requests cranberry juice thickened to pudding consistency. Places on portable O2 at 4L with SatO2 maintaining >95% throughout session. Attempted sit<>stand from [facility] using RW however unsuccessful x 2 attempts. Pt transported to therapy gym with totalA d/t sternal precuations, performs WC<>car transfer with modA for boosting using RW. Pt is anxious to sit during transfers and requires vcing to turn completely. At end of session, pt is returned to room in manual [facility], nursing staff alerted to help pt back to bed, denies any needs at this time.   Therapy Documentation Precautions:  Precautions Precautions: Fall, Sternal Precaution Comments: Reinforced sternal precautions Restrictions Weight Bearing Restrictions: No Other Position/Activity Restrictions: sternal precautions Pain: Pain Assessment Pain Assessment: No/denies pain   See Function Navigator for Current Functional Status.   Therapy/Group: Individual Therapy  Salvatore Decent 08/14/2017, 12:03 PM

## 2017-08-14 NOTE — Progress Notes (Signed)
Initial Nutrition Assessment  DOCUMENTATION CODES:   Not applicable  INTERVENTION:   RD to order pt's dinner (yogurt, fruit, orange magic cup, chocolate pudding, and sweet tea).  RD to increase Pro-Stat frequency to 4x/day per tube.  RD to order Magic cup 3x/day with meals and 2x/day between meals; each supplement provides 290kcals and 9g of protein.  NUTRITION DIAGNOSIS:   Inadequate oral intake related to other (see comment), dysphagia(pt dislike of dysphagia diet, pudding thick liquids) as evidenced by per patient/family report, meal completion < 25%.  GOAL:   Patient will meet greater than or equal to 90% of their needs  MONITOR:   PO intake, Weight trends, Supplement acceptance, Diet advancement, Skin, I & O's, Labs  REASON FOR ASSESSMENT:   Consult Enteral/tube feeding initiation and management  ASSESSMENT:   [name] with PMH of HTN, HLD, and B12 deficiency. S/p planned repair of ascending aortic aneurysm and aortic valve replacement on 07/10/2017. S/p right ventricular assist device and hematoma exploration/removal on 07/11/2017. RVAD removed and sternum closed on 07/17/2017. Pt required vent support and subsequent tracheostomy on 07/25/2017. Tube feeds D/C 08/14/2017; pt on Dysphagia I, pudding thick liquids 08/12/2017.  Pt reports having a decent appetite but severe dislike for the dysphagia I foods and pudding thick liquids. Pt reports not consuming any of her meals today or yesterday with the exception of a few bits of pudding, apple sauce, and some thickened juices. Pt reports SLP will re-evaluate in 2 days and pt is hopeful for diet change.   Pt expressed desire to try different foods and beverages than what has been brought to her room. Intern spoke with RN at bedside and requested assistance in helping pt to order different items. Intern encouraged p.o. Intake.  Pt states she would like yogurt, fruit, chocolate pudding, and orange Magic Cup for dinner. RD to  order. Pt amenable to trying orange Magic Cup with meals and between meals.   Pt denies any N/V/D but does admit to some constipation. Pt reports not having a bowel movement for 2 days. Per chart review, pt had a bowel movement yesterday as outlined below.   Pt has lost 6lb (3.5%) from 07/06/17 to 08/12/17 which is not significant for time frame; pt then gained ~1lb since 08/12/17 as outlined below.  Minor wt fluctuations appear to be fluid related, but will continue to monitor for any downward trends.   Medications: Free water 241mL 4x/day via tube, lasix, insulin, levothyroxine, aldactone.  Labs: BUN (H;33), corrected Ca (L; 7.98), albumin (3.0)  CBG (last 3)  Recent Labs    08/14/17 0800 08/14/17 1141 08/14/17 1610  GLUCAP 98 99 102*   NUTRITION - FOCUSED PHYSICAL EXAM:    Most Recent Value  Orbital Region  No depletion  Upper Arm Region  No depletion  Thoracic and Lumbar Region  No depletion  Buccal Region  No depletion  Temple Region  Mild depletion  Clavicle Bone Region  No depletion  Clavicle and Acromion Bone Region  No depletion  Scapular Bone Region  No depletion  Dorsal Hand  Mild depletion  Patellar Region  Mild depletion  Anterior Thigh Region  Mild depletion  Posterior Calf Region  Mild depletion  Edema (RD Assessment)  Moderate  Hair  Reviewed  Eyes  Reviewed  Mouth  Reviewed  Skin  Reviewed     Noted significant bruising on bilateral arms and legs; pt reports this is due to skin allergy to latex and medical tape.  Diet Order:  DIET - DYS 1 Room service appropriate? Yes; Fluid consistency: Pudding Thick  EDUCATION NEEDS:   No education needs have been identified at this time  Skin:  Skin Assessment: Skin Integrity Issues: Skin Integrity Issues:: Stage II, Incisions, Other (Comment) Stage II: Neck  Incisions: abdomen, groin, chest x2 Other: Laceration/skin tears lower legs  Last BM:  2/17 type 4 medium  Height:   Ht Readings from Last 1  Encounters:  08/12/17 5\' 5"  (1.651 m)   Weight:   Wt Readings from Last 3 Encounters:  08/14/17 163 lb 6.6 oz (74.1 kg)  08/12/17 162 lb 11.2 oz (73.8 kg)  07/06/17 168 lb 11.2 oz (76.5 kg)  06/23/17  171lb 6.4oz  Ideal Body Weight:  56.8 kg  BMI:  Body mass index is 27.19 kg/m.  Estimated Nutritional Needs:   Kcal:  1600-1800  Protein:  110-120g  Fluid:  1.6-1.8L    Daishon Chui, MS, Dietetic Intern Pager # [phone]

## 2017-08-14 NOTE — Progress Notes (Signed)
Switched pt to humidified Air at 5L/21%. VS within normal limits. RT will continue to monitor

## 2017-08-14 NOTE — IPOC Note (Signed)
Overall Plan of Care [facility]) Patient Details Name: TEEGHAN HAMMER MRN: [id number] DOB: 1941-01-20  Admitting Diagnosis: <principal problem not specified>debility  Hospital Problems: Active Problems:   Essential hypertension   S/P AVR   Tracheostomy in place ([place])   Pressure injury of skin   PAF (paroxysmal atrial fibrillation) ([place])   Debility     Functional Problem List: Nursing Behavior, Bladder, Bowel, Endurance, Medication Management, Motor, Skin Integrity, Sensory, Safety, Pain  PT Balance, Endurance, Motor, Nutrition, Pain, Safety  OT Balance, Pain, Cognition, Perception, Safety, Endurance  SLP    TR         Basic ADL's: OT Eating, Grooming, Bathing, Dressing, Toileting     Advanced  ADL's: OT       Transfers: PT Bed to Chair, Car, Furniture, [employer], Metallurgist: PT Ambulation, Emergency planning/management officer, Stairs     Additional Impairments: OT None  SLP        TR      Anticipated Outcomes Item Anticipated Outcome  Self Feeding Mod I  Swallowing      Basic self-care  Supervision  Toileting  Supervision   Bathroom Transfers Supervision-min A  Bowel/Bladder  Continent of bowel/bladder with mod assist  Transfers  supervision  Locomotion  supervision  Communication     Cognition     Pain  <2  Safety/Judgment  To be able to follow safety plan   Therapy Plan: PT Intensity: Minimum of 1-2 x/day ,45 to 90 minutes PT Frequency: 5 out of 7 days PT Duration Estimated Length of Stay: 18-20 days OT Intensity: Minimum of 1-2 x/day, 45 to 90 minutes OT Frequency: 5 out of 7 days OT Duration/Estimated Length of Stay: 3 weeks      Team Interventions: Nursing Interventions Patient/Family Education, Disease Management/Prevention, Skin Care/Wound Management, Discharge Planning, Bladder Management, Pain Management, Cognitive Remediation/Compensation, Bowel Management, Medication Management  PT interventions Discharge planning,  Ambulation/gait training, Functional mobility training, Therapeutic Activities, Balance/vestibular training, Disease management/prevention, Neuromuscular re-education, Skin care/wound management, Wheelchair propulsion/positioning, Therapeutic Exercise, Cognitive remediation/compensation, DME/adaptive equipment instruction, Splinting/orthotics, UE/LE Strength taining/ROM, Academic librarian, Barrister's clerk education, IT trainer, UE/LE Coordination activities  OT Interventions Training and development officer, Discharge planning, Pain management, Self Care/advanced ADL retraining, Therapeutic Activities, UE/LE Coordination activities, Cognitive remediation/compensation, Disease mangement/prevention, Functional mobility training, Patient/family education, Skin care/wound managment, Therapeutic Exercise, Community reintegration, Engineer, drilling, Neuromuscular re-education, Psychosocial support, UE/LE Strength taining/ROM  SLP Interventions    TR Interventions    SW/CM Interventions Discharge Planning, Psychosocial Support, Patient/Family Education   Barriers to Discharge MD  Medical stability  Nursing Trach    PT Inaccessible home environment, Decreased caregiver support, New oxygen    OT      SLP      SW       Team Discharge Planning: Destination: PT-Home ,OT- Home , SLP-  Projected Follow-up: PT-Home health PT, OT-  Home health OT, SLP-  Projected Equipment Needs: PT-To be determined, OT- To be determined, SLP-  Equipment Details: PT- , OT-  Patient/family involved in discharge planning: PT- Patient,  OT-Patient, Family member/caregiver, SLP-   MD ELOS: 2-3 weeks Medical Rehab Prognosis:  Excellent Assessment: The patient has been admitted for CIR therapies with the diagnosis of debility after AVR and VDRF. The team will be addressing functional mobility, strength, stamina, balance, safety, adaptive techniques and equipment, self-care, bowel and bladder mgt, patient  and caregiver education, speech, swallowing, communication, wound care, activity tolerance. Goals have been set at supervision  for mobility, self-care and swallowing.    Meredith Staggers, MD, FAAPMR      See Team Conference Notes for weekly updates to the plan of care

## 2017-08-14 NOTE — Progress Notes (Signed)
Occupational Therapy Session Note  Patient Details  Name: [name] MRN: [id number] Date of Birth: August 01, 1940  Today's Date: 08/14/2017 OT Individual Time: 3338-3291 OT Individual Time Calculation (min): 70 min    Short Term Goals: Week 1:  OT Short Term Goal 1 (Week 1): Pt will recall and utilize sternal pre-cautions during functional tasks with mod A OT Short Term Goal 2 (Week 1): Pt will complete stand pivot transfer to BSC/ toilet with mod A OT Short Term Goal 3 (Week 1): Pt will complete 1/3 toileting tasks with mod steadying assist in order to decrease caregiver burden OT Short Term Goal 4 (Week 1): Pt will don shirt with min A  Skilled Therapeutic Interventions/Progress Updates:    Pt resting in w/c upon arrival.  RN present attempting to administer medications. Pt already dressed from earlier session.  OT intervention with focus on sit<>stand and standing balance.  Pt performed sit<>stand X 6 requiring less assistance with each task.  Pt initially required max A and transitioned to min A for sit<>stand.  Pt with significant posterior lean and requires max A to keep weight forward.  Pt focused on getting better so she can go home.  Pt with limited awareness to deficits stating that she doesn't understand why she can't have an egg sandwich.  Pt able to recall sternal precautions. Pt remained in w/c with all needs within reach.  PMSV removed.  Therapy Documentation Precautions:  Precautions Precautions: Fall, Sternal Precaution Comments: Reinforced sternal precautions Restrictions Weight Bearing Restrictions: No Other Position/Activity Restrictions: sternal precautions Pain: Pain Assessment Pain Assessment: No/denies pain  See Function Navigator for Current Functional Status.   Therapy/Group: Individual Therapy  Leroy Libman 08/14/2017, 12:06 PM

## 2017-08-14 NOTE — Progress Notes (Signed)
Cortrak tube clogged when giving morning medications.  Followed protocol to unclog with no success. Provider and Cortrak team notified.  Cortrak team will come to assess. Medications were able to be given via alternate route.

## 2017-08-14 NOTE — Progress Notes (Signed)
PMR Admission Coordinator Pre-Admission Assessment  Patient: [name] is an 77 y.o., female MRN: [id number] DOB: [birth year] Height: 5' 5"  (165.1 cm) Weight: 74.5 kg (164 lb 3.9 oz)                                                                                                                                                  Insurance Information HMO:     PPO: X     PCP:      IPA:      80/20:      OTHER:  PRIMARY: BCBS Medicare       Policy#: [id number]      Subscriber: Self  CM Name: [name]       Phone#: [phone]     Fax#: [phone] Pre-Cert#: [id number] for 08/12/17-08/17/17 with faxed updates due after conference on 08/16/17             Employer: Retired  Benefits:  Phone #: [phone]     Name: Verified online at [email or web address] Eff. Date: 06/27/17     Deduct: $0      Out of Pocket Max: $5900      Life Max: N/A CIR: $310 a day, days 1-6; $0 a day, days 7+      SNF: $0 a day, days 1-20; $172 a day, days 21-60; $0 a day, days 61-100 Outpatient: Necessity      Co-Pay: $40 per visit  Home Health: Necessity       Co-Pay: $0 DME: 80%     Co-Pay: 20% Providers: In-network   SECONDARY: None      Policy#:       Subscriber:  CM Name:       Phone#:      Fax#:  Pre-Cert#:       Employer:  Benefits:  Phone #:      Name:  Eff. Date:      Deduct:       Out of Pocket Max:       Life Max:  CIR:       SNF:  Outpatient:      Co-Pay:  Home Health:       Co-Pay:  DME:      Co-Pay:   Medicaid Application Date:       Case Manager:  Disability Application Date:       Case Worker:   Emergency Contact Information        Contact Information    Name Relation Home Work Mobile   [name],[name] Spouse [phone]     [name] Daughter [phone]       Current Medical History  Patient Admitting Diagnosis: Debility related to abdominal aneurysm/AS s/prepair with subsequent complications. Patient currently intubated/on the ventilator  History of Present Illness: [name] a 77 y.o.right handed femalewho is been followed by cardiothoracic surgery for  a fusiform ascending aneurysm which has increased in size over the past 1-2 years with associated moderate aortic insufficiency. Per chart review patient lives with spouse. Independent with assistive device using a cane prior to admission. One level home 4 steps to entry.Spouse can assist as needed.Presented 07/09/2017 due to increasing size of a ascending aneurysm and underwent repair of ascending aneurysm aswell as aortic valve replacement 07/10/2017 per Dr. Nils Pyle. Postoperative ventilatory support. Findings a small amount of anterior mediastinal hematoma as well as orthostasis requiring pressor support. Her urine output decreased concern fortamponadeand return back to the operating room for reexploration to remove any blood/hematoma 07/11/2017 as well as findings RV function significantly decreased from earlier postoperative status and underwent implantation of percutaneous right ventricular assist device.Hospital course ventilatory support underwent tracheostomy 07/25/2017 per Dr. Nelda Marseille and has since been down sized to a #4 cuff less 08/08/2017 and tolerating PMV. No current plan to decannulate.Hospital course acute blood loss anemia. Bouts of atrial fibrillation followed by cardiology servicesand currently maintained on amiodarone. Currently maintained on subcutaneous Lovenox for DVT prophylaxis. Bouts of hypokalemia supplement added.Dysphagia 1 pudding-thickliquidsand nasogastric tube feeds for nutritional support.Physical as well as occupational therapy evaluationscompleted 07/19/2017. Request made for physical medicine rehabilitation consult.Patient was admitted for a comprehensive rehabilitation program 08/12/17.    Past Medical History      Past Medical History:  Diagnosis Date  . ACL tear    right  Dr. Gladstone Pih   . Adenomatous polyp   . Anxiety   . Aorta aneurysm ([place])   .  Arthritis   . Ascending aortic aneurysm ([place])    note per chart per Dr Lucianne Lei Tright 4.7 cm 04/15/2015   . B12 deficiency   . Cancer ([place])   . Cataracts, both eyes 10/2006  . Chronic bronchitis ([place])   . Constipation   . Depression   . DUB (dysfunctional uterine bleeding) 10/96  . ETD (eustachian tube dysfunction)   . Fall   . Fibromyalgia   . GERD (gastroesophageal reflux disease)   . Glaucoma    ([place]) Dr. Arnoldo Morale   . Glaucoma    bilaterally  . Helicobacter pylori (H. pylori)   . Hiatal hernia   . History of bronchitis   . History of frequent urinary tract infections   . History of kidney stones   . History of left shoulder fracture    pt states fell off bed and broke ball in shoulder had rod placed   . Hyperlipidemia   . Hypertension   . Hypothyroidism   . Insomnia   . Leg wound, left    healed   . MVP (mitral valve prolapse)   . Occasional tremors    left arm   . Osteoarthritis   . Osteopenia   . Osteoporosis   . Other and unspecified hyperlipidemia   . Post-menopausal   . Thyroid cancer ([place])   . Thyroid nodule   . Tremor     Family History  family history includes Arthritis in her father; Cancer in her mother and sister; Heart attack (age of onset: 68) in her father; Heart disease in her father; Hip fracture in her mother; Hypertension in her sister and sister; Osteoporosis in her mother.  Prior Rehab/Hospitalizations:  Has the patient had major surgery during 100 days prior to admission? Yes  Current Medications   Current Facility-Administered Medications:  .  0.9 %  sodium chloride infusion, 250 mL, Intravenous, Continuous, [name], Collier Salina, MD, Last Rate: 10 mL/hr at 07/22/17 1820,  250 mL at 07/22/17 1820 .  acetaminophen (TYLENOL) solution 650 mg, 650 mg, Per Tube, Q6H PRN, Rush Farmer, MD, 650 mg at 08/10/17 0954 .  amiodarone (PACERONE) tablet 200 mg, 200 mg, Oral, Daily, [name], Collier Salina, MD, 200 mg at  08/11/17 0944 .  arformoterol (BROVANA) nebulizer solution 15 mcg, 15 mcg, Nebulization, BID, Erick Colace, NP, 15 mcg at 08/11/17 0805 .  aspirin EC tablet 325 mg, 325 mg, Oral, Daily, Stopped at 07/29/17 1037 **OR** aspirin chewable tablet 324 mg, 324 mg, Per Tube, Daily, [name], Collier Salina, MD, 324 mg at 08/11/17 0945 .  budesonide (PULMICORT) nebulizer solution 0.5 mg, 0.5 mg, Nebulization, BID, Erick Colace, NP, 0.5 mg at 08/11/17 0802 .  chlorhexidine gluconate (MEDLINE KIT) (PERIDEX) 0.12 % solution 15 mL, 15 mL, Mouth Rinse, BID, [name], Collier Salina, MD, 15 mL at 08/11/17 0932 .  Chlorhexidine Gluconate Cloth 2 % PADS 6 each, 6 each, Topical, Daily, [name], Collier Salina, MD, 6 each at 08/10/17 0600 .  clonazePAM (KLONOPIN) disintegrating tablet 1 mg, 1 mg, Oral, QHS, [name], Collier Salina, MD, 1 mg at 08/10/17 2100 .  enoxaparin (LOVENOX) injection 40 mg, 40 mg, Subcutaneous, Q24H, [name], Collier Salina, MD, 40 mg at 08/11/17 0943 .  feeding supplement (PRO-STAT SUGAR FREE 64) liquid 30 mL, 30 mL, Per Tube, TID, Rigoberto Noel, MD, 30 mL at 08/11/17 0942 .  feeding supplement (VITAL 1.5 CAL) liquid 1,000 mL, 1,000 mL, Per Tube, Continuous, Rigoberto Noel, MD, Last Rate: 40 mL/hr at 08/10/17 1900, 1,000 mL at 08/10/17 1900 .  furosemide (LASIX) tablet 40 mg, 40 mg, Oral, Daily, [name], Collier Salina, MD, 40 mg at 08/11/17 0948 .  Gerhardt's butt cream, , Topical, BID, [name], Peter, MD .  CBG monitoring, , , Q4H **AND** insulin aspart (novoLOG) injection 0-24 Units, 0-24 Units, Subcutaneous, Q4H, Ivin Poot, MD, 2 Units at 08/09/17 2109 .  insulin detemir (LEVEMIR) injection 10 Units, 10 Units, Subcutaneous, BID, [name], Collier Salina, MD, 10 Units at 08/11/17 [phone] .  lactated ringers infusion, , Intravenous, Continuous, [name], Collier Salina, MD, Stopped at 07/17/17 1200 .  latanoprost (XALATAN) 0.005 % ophthalmic solution 1 drop, 1 drop, Both Eyes, QHS, [name], Collier Salina, MD, 1 drop at 08/10/17 2102 .   levalbuterol (XOPENEX) nebulizer solution 0.63 mg, 0.63 mg, Nebulization, BID, [name], Collier Salina, MD, 0.63 mg at 08/11/17 0759 .  levalbuterol (XOPENEX) nebulizer solution 0.63 mg, 0.63 mg, Nebulization, Q6H PRN, [name], Collier Salina, MD .  levothyroxine (SYNTHROID, LEVOTHROID) tablet 100 mcg, 100 mcg, Oral, QAC breakfast, [name], Collier Salina, MD, 100 mcg at 08/11/17 0947 .  MEDLINE mouth rinse, 15 mL, Mouth Rinse, QID, [name], Peter, MD, 15 mL at 08/11/17 1200 .  metoprolol tartrate (LOPRESSOR) injection 2.5-5 mg, 2.5-5 mg, Intravenous, Q2H PRN, [name], Collier Salina, MD, 5 mg at 07/19/17 1246 .  naphazoline-glycerin (CLEAR EYES REDNESS) ophth solution 1-2 drop, 1-2 drop, Both Eyes, BID, [name], Collier Salina, MD, 2 drop at 08/11/17 [phone] .  ondansetron (ZOFRAN) injection 4 mg, 4 mg, Intravenous, Q6H PRN, [name], Collier Salina, MD .  pantoprazole (PROTONIX) injection 40 mg, 40 mg, Intravenous, Q12H, Erick Colace, NP, 40 mg at 08/11/17 0943 .  QUEtiapine (SEROQUEL) tablet 75 mg, 75 mg, Oral, QHS, [name], Collier Salina, MD, 75 mg at 08/10/17 2100 .  RESOURCE THICKENUP CLEAR, , Oral, PRN, [name], Collier Salina, MD .  sennosides (SENOKOT) 8.8 MG/5ML syrup 5 mL, 5 mL, Oral, BID, [name],  Collier Salina, MD, 5 mL at 08/11/17 0955 .  sodium chloride flush (NS) 0.9 % injection 10-40 mL, 10-40 mL, Intracatheter, Q12H, [name], Collier Salina, MD, 10 mL at 08/11/17 1509 .  spironolactone (ALDACTONE) tablet 25 mg, 25 mg, Oral, Daily, Bensimhon, Shaune Pascal, MD, 25 mg at 08/11/17 0947 .  venlafaxine [facility]) tablet 37.5 mg, 37.5 mg, Oral, BID, [name], Collier Salina, MD, 37.5 mg at 08/11/17 2863  Patients Current Diet: DIET - DYS 1 Room service appropriate? Yes; Fluid consistency: Pudding Thick  Precautions / Restrictions Precautions Precautions: Fall, Sternal Precaution Comments: Reinforced sternal precautions Restrictions Weight Bearing Restrictions: No Other Position/Activity Restrictions: sternal precautions   Has the patient had 2 or more  falls or a fall with injury in the past year?No  Prior Activity Level Limited Community (1-2x/wk): Prior to admission patient went out when spouse drove her and they enjoyed going out to eat.    Home Assistive Devices / Equipment Home Assistive Devices/Equipment: Cane (specify quad or straight), Walker (specify type), Shower chair with back Home Equipment: Cane - single point  Prior Device Use: Indicate devices/aids used by the patient prior to current illness, exacerbation or injury? Single point cane when out in the comminuty   Prior Functional Level Prior Function Level of Independence: Independent with assistive device(s) Comments: walked with cane, sponge bathed and washed hair over sink, worked together with husband on housekeeping and cooking, drives rarely  Self Care: Did the patient need help bathing, dressing, using the toilet or eating? Needed some help bathing  Indoor Mobility: Did the patient need assistance with walking from room to room (with or without device)? Independent  Stairs: Did the patient need assistance with internal or external stairs (with or without device)? Independent  Functional Cognition: Did the patient need help planning regular tasks such as shopping or remembering to take medications? Needed some help  Current Functional Level Cognition  Overall Cognitive Status: Impaired/Different from baseline Difficult to assess due to: (PMV in place.  ) Current Attention Level: Sustained Orientation Level: Oriented X4(intermittent confusion, delerius) Following Commands: Follows one step commands consistently Safety/Judgement: Decreased awareness of safety, Decreased awareness of deficits General Comments: tangential at times; talking about doing     Extremity Assessment (includes Sensation/Coordination)  Upper Extremity Assessment: RUE deficits/detail, LUE deficits/detail RUE Deficits / Details: longstanding shoulder limitations, generalized  weakness RUE Coordination: decreased gross motor LUE Deficits / Details: longstanding shoulder limitations, generalized weakness LUE Coordination: decreased gross motor  Lower Extremity Assessment: Generalized weakness    ADLs  Overall ADL's : Needs assistance/impaired Eating/Feeding: Moderate assistance Eating/Feeding Details (indicate cue type and reason): pudding thick liquids 2/12 Grooming: Sitting, Wash/dry hands, Minimal assistance, Set up, Brushing hair, Wash/dry face, Cueing for sequencing Grooming Details (indicate cue type and reason): Pt required vc to brush back of head, cues for sternal precautions (tub terminology used) Upper Body Bathing: Maximal assistance, Sitting Lower Body Bathing: Total assistance, Bed level Upper Body Dressing : Moderate assistance Upper Body Dressing Details (indicate cue type and reason): Pt assisted by lifting BUE to put through armholes Lower Body Dressing: Maximal assistance Lower Body Dressing Details (indicate cue type and reason): to doff socks Toilet Transfer: Maximal assistance, +2 for physical assistance, +2 for safety/equipment, Squat-pivot(2 HHA) Toilet Transfer Details (indicate cue type and reason): vc with sit <>stand to push off legs, improved scoot ability this session Toileting- Clothing Manipulation and Hygiene: Total assistance Toileting - Clothing Manipulation Details (indicate cue type and reason): Pt able to stand with +2 A  while pericare was completed Functional mobility during ADLs: Maximal assistance, +2 for physical assistance, Cueing for sequencing, Cueing for safety(EVA walker) General ADL Comments: Pt continues with poor insight into eating restrictions "the MD told me I can eat icecream and drink coke" when this is not the case.    Mobility  Overal bed mobility: Needs Assistance Bed Mobility: Supine to Sit, Sit to Supine, Rolling Rolling: Min assist, +2 for physical assistance Sidelying to sit: Max assist, +2 for  physical assistance, HOB elevated Supine to sit: Min assist, HOB elevated Sit to supine: Mod assist, +2 for physical assistance Sit to sidelying: Max assist, +2 for physical assistance General bed mobility comments: Cues for hand placement to avoid pulling.  Pt required assistance to lower trunk and lift B LEs into bed.      Transfers  Overall transfer level: Needs assistance Equipment used: 4-wheeled walker(EVA walker) Transfers: Sit to/from Stand, Stand Pivot Transfers Sit to Stand: Max assist, +2 physical assistance Stand pivot transfers: +2 physical assistance, Max assist Squat pivot transfers: Max assist, +2 safety/equipment, +2 physical assistance General transfer comment: Cues for hand placement on knees to and from seated surface to maintain sternal precautions.  Pt followed commands better.  Pt required cues for hip extension and anterior translation into standing.  Pt walks B feet forward and flexes B hips.  Pt unable to correct with VCs and requires tactile cues to facilitate extension.      Ambulation / Gait / Stairs / Wheelchair Mobility  Ambulation/Gait Ambulation/Gait assistance: Max assist, +2 physical assistance Ambulation Distance (Feet): 34 Feet Assistive device: 4-wheeled walker(EVA) Gait Pattern/deviations: Shuffle, Trunk flexed, Decreased stride length, Leaning posteriorly, Narrow base of support, Scissoring General Gait Details: Unsteady ambulation using Eva walker and maxA+2 to maintain balance and trunk elevation, and control walker. Pt with significant posterior lean, with feet too far forward and increased difficulty translating her trunk forward despite multimodal cues. Pt presents with scissoring narrow BOS and cues to increase BOS.   Gait velocity: Decreased Gait velocity interpretation: Below normal speed for age/gender    Posture / Balance Dynamic Sitting Balance Sitting balance - Comments: Pt remains to push posterior when scooting to edge of bed.  Pt  required cues for anterior positioning of pelvis to improve sitting posture.   Balance Overall balance assessment: Needs assistance Sitting-balance support: Feet supported, Bilateral upper extremity supported Sitting balance-Leahy Scale: Fair Sitting balance - Comments: Pt remains to push posterior when scooting to edge of bed.  Pt required cues for anterior positioning of pelvis to improve sitting posture.   Postural control: Posterior lean Standing balance-Leahy Scale: Poor Standing balance comment: posterior lean, forward placement of B feet and resistant to facilitation to correct.      Special needs/care consideration BiPAP/CPAP: No CPM: No Continuous Drip IV: No Dialysis: No         Life Vest: No Oxygen: Yes 28% FiO2 via trach collar  Special Bed: ICU bed Trach Size: 4 cuffless Wound Vac (area): No       Skin: Dry, Abrasions to right shin, Ecchymosis to bilateral upper and lower extremities as well as abdomen with small skin tears to extremities as well Bowel mgmt: Continent, last BM 08/09/17 and monitoring for constipation due to iron supplements  Bladder mgmt: External catheter; however, patient reports that she knows when she needs to go  Diabetic mgmt: None PTA      Previous Home Environment Living Arrangements: Spouse/significant other Available Help at Discharge: Family, Available 24  hours/day Type of Home: Mobile home Home Layout: One level Home Access: Stairs to enter Entrance Stairs-Rails: Right, Left, Can reach both Entrance Stairs-Number of Steps: 4 Bathroom Shower/Tub: Multimedia programmer: Handicapped height [place]: No Additional Comments: ? may have 02 in home  Discharge Living Setting Plans for Discharge Living Setting: Patient's home, Lives with (comment)(Spouse) Type of Home at Discharge: Mobile home Discharge Home Layout: One level Discharge Home Access: Stairs to enter Entrance Stairs-Rails: Can reach both Entrance  Stairs-Number of Steps: 4 Discharge Bathroom Shower/Tub: Walk-in shower Discharge Bathroom Toilet: Handicapped height Discharge Bathroom Accessibility: Yes How Accessible: Accessible via walker(sideways through doorway) Does the patient have any problems obtaining your medications?: No  Social/Family/Support Systems Patient Roles: Spouse Contact Information: Spouse [name] [phone], cell [phone] Anticipated Caregiver: Spouse and others as needed Anticipated Caregiver's Contact Information: see above  Ability/Limitations of Caregiver: None Caregiver Availability: 24/7 Discharge Plan Discussed with Primary Caregiver: Yes Is Caregiver In Agreement with Plan?: Yes Does Caregiver/Family have Issues with Lodging/Transportation while Pt is in Rehab?: No   Goals/Additional Needs Patient/Family Goal for Rehab: PT/OT/SLP: Min-Mod A  Expected length of stay: 2-3 weeks  Cultural Considerations: [employer]  Dietary Needs: Dys.1 textures with pudding-thick liquids  Equipment Needs: TBD Pt/Family Agrees to Admission and willing to participate: Yes Program Orientation Provided & Reviewed with Pt/Caregiver Including Roles  & Responsibilities: Yes  Barriers to Discharge: Medical stability, Trach, Nutrition means  Decrease burden of Care through IP rehab admission: No  Possible need for SNF placement upon discharge: No  Patient Condition: This patient's medical and functional status has changed since the consult dated: 08/11/17 in which the Rehabilitation Physician determined and documented that the patient's condition is appropriate for intensive rehabilitative care in an inpatient rehabilitation facility. See "History of Present Illness" (above) for medical update. Functional changes are: Max A +2 transfers and gait with the EVA walker. Patient's medical and functional status update has been discussed with the Rehabilitation physician and patient remains appropriate for inpatient  rehabilitation. Will admit to inpatient rehab tomorrow.  Preadmission Screen Completed By:  [name], 08/11/2017 4:01 PM ______________________________________________________________________   Discussed status with Dr. Naaman Plummer on 2/15//19 at 1612 and received telephon/ approval for admission today.  Admission Coordinator:  [name], time 1612/Date 08/11/17             Cosigned by: Meredith Staggers, MD at 08/12/2017 8:55 AM  Revision History

## 2017-08-14 NOTE — Progress Notes (Signed)
[facility] PHYSICAL MEDICINE & REHABILITATION     PROGRESS NOTE    Subjective/Complaints: Patient had a better night.  Anxious to get her trach out.  Working with therapy already this morning.   ROS: pt denies nausea, vomiting, diarrhea, cough, shortness of breath or chest pain   Objective: Vital Signs:  Blood pressure 128/71, pulse 82, temperature 98.9 F (37.2 C), temperature source Axillary, resp. rate 20, height 5\' 5"  (1.651 m), weight 74.1 kg (163 lb 6.6 oz), SpO2 98 %. No results found. Recent Labs    08/12/17 1716 08/14/17 0631  WBC 8.9 6.8  HGB 12.7 11.9*  HCT 41.2 38.6  PLT 246 209   Recent Labs    08/12/17 1716 08/14/17 0631  NA 142 141  K 3.7 3.7  CL 101 97*  GLUCOSE 113* 129*  BUN 34* 33*  CREATININE 0.56 0.61  CALCIUM 8.0* 7.9*   CBG (last 3)  Recent Labs    08/14/17 0058 08/14/17 0328 08/14/17 0800  GLUCAP 126* 106* 98    Wt Readings from Last 3 Encounters:  08/14/17 74.1 kg (163 lb 6.6 oz)  08/12/17 73.8 kg (162 lb 11.2 oz)  07/06/17 76.5 kg (168 lb 11.2 oz)    Physical Exam:  HENT:  Head: Normocephalic.  Nasogastric tube in place.   Eyes:  Pupils round and reactive to light  Neck:  Trach tube in place. #4 with PMV   Cardiac: RRR without murmur. No JVD   Respiratory: Patient with improved phonation today.  No audible rhonchi or wheezes.  No distress.   GI: Soft. Bowel sounds are normal. She exhibits no distension. There is no tenderness. There is no rebound.  Musculoskeletal: She exhibits no tenderness or deformity.  Neurological:  Sitting up in chair. Makes good eye contact with examiner. Followed full commands. Provides her name agent date of birth. Moves all extremities. UE 4- to 4/5 prox to distal. LE: 2 to 2+ HF, 3- KE and 3+ ADF/PF. No focal sensory deficits --motor and sensory exam stable Psychiatric: Patient generally cooperative but slightly anxious this morning     Assessment/Plan: 1.  Functional deficits secondary to  debility which require 3+ hours per day of interdisciplinary therapy in a comprehensive inpatient rehab setting. Physiatrist is providing close team supervision and 24 hour management of active medical problems listed below. Physiatrist and rehab team continue to assess barriers to discharge/monitor patient progress toward functional and medical goals.  Function:  Bathing Bathing position Bathing activity did not occur: Refused(Pt refused bathing on eval stating nursing assisted prior to tx session)    Bathing parts      Bathing assist        Upper Body Dressing/Undressing Upper body dressing   What is the patient wearing?: Hospital gown                Upper body assist        Lower Body Dressing/Undressing Lower body dressing   What is the patient wearing?: Non-skid slipper socks, Hospital Gown, Pants       Pants- Performed by helper: Thread/unthread right pants leg, Thread/unthread left pants leg, Pull pants up/down Non-skid slipper socks- Performed by patient: Don/doff left sock Non-skid slipper socks- Performed by helper: Don/doff right sock                  Lower body assist        Toileting Toileting     Toileting steps completed by helper: Adjust clothing prior  to toileting, Performs perineal hygiene, Adjust clothing after toileting    Toileting assist Assist level: Two helpers   Transfers Chair/bed transfer   Chair/bed transfer method: Stand pivot Chair/bed transfer assist level: Moderate assist (Pt 50 - 74%/lift or lower) Chair/bed transfer assistive device: Medical sales representative     Max distance: 5 ft Assist level: Maximal assist (Pt 25 - 49%)   Wheelchair Wheelchair activity did not occur: Safety/medical concerns(unable to propel using B UEs (sternal precautions), w/c to tall for LE propulsion) Type: Manual      Cognition Comprehension Comprehension assist level: Understands basic 50 - 74% of the time/ requires cueing 25 -  49% of the time  Expression Expression assist level: Expresses basic 50 - 74% of the time/requires cueing 25 - 49% of the time. Needs to repeat parts of sentences.  Social Interaction Social Interaction assist level: Interacts appropriately 50 - 74% of the time - May be physically or verbally inappropriate.  Problem Solving Problem solving assist level: Solves basic 50 - 74% of the time/requires cueing 25 - 49% of the time  Memory Memory assist level: Recognizes or recalls 50 - 74% of the time/requires cueing 25 - 49% of the time  Medical Problem List and Plan:  1. Debility secondary to debility related to abdominal aneurysm/AS with AVR 07/10/2017 status post repair and subsequent cardiogenic shock  -Continue therapies PT, OT, speech 2. DVT Prophylaxis/Anticoagulation: Subcutaneous Lovenox. Check vascular study  3. Pain Management: Tylenol as needed  4. Mood: Klonopin 1 mg daily at bedtime, Seroquel 75 mg bedtime, Effexor 37.5 mg twice a day    -Klonopin as needed during the day for anxiety  5. Neuropsych: This patient is capable of making decisions on her own behalf.  6. Skin/Wound Care: Routine skin checks  7. Fluids/Electrolytes/Nutrition: We will discontinue scheduled continuous tube feeds and provide free water through the tube as well as protein supplements only to help stimulate appetite.  Currently only on D1 and pudding liquids   -Recheck BMET tomorrow morning 8. Tracheostomy 07/25/2017. Downsized to #4 cuffless 08/08/2017. No current plan to decannulate due to failure with capping. Follow-up per Dr. Nelda Marseille.    -Continue nebulizers, trach care and suction   -Guaifenesin to help with secretions   -Could potentially consider another capping trial this week if she does well  9. Atrial fibrillation. Amiodarone 200 mg daily. Cardiac rate controlled  10. Dysphagia. Dysphagia #1 pudding liquids. Follow-up speech therapy  11. Acute diastolic congestive heart failure. Lasix 40 mg daily,  Aldactone 25 mg daily. Monitor for any signs of fluid overload    -May need to back off diuretics slightly BUN continues to rise Filed Weights   08/12/17 1636 08/13/17 0042 08/14/17 0010  Weight: 74.4 kg (164 lb) 74.4 kg (164 lb 0.4 oz) 74.1 kg (163 lb 6.6 oz)   12. Hypothyroidism. Synthroid  13. Hyperglycemia secondary to tube feeds.      LOS (Days) 2 A Elida T, MD 08/14/2017 11:03 AM

## 2017-08-15 ENCOUNTER — Inpatient Hospital Stay (HOSPITAL_COMMUNITY): Payer: Medicare Other | Admitting: Physical Therapy

## 2017-08-15 ENCOUNTER — Inpatient Hospital Stay (HOSPITAL_COMMUNITY): Payer: Medicare Other

## 2017-08-15 ENCOUNTER — Inpatient Hospital Stay (HOSPITAL_COMMUNITY): Payer: Medicare Other | Admitting: Speech Pathology

## 2017-08-15 DIAGNOSIS — R609 Edema, unspecified: Secondary | ICD-10-CM

## 2017-08-15 LAB — BASIC METABOLIC PANEL
ANION GAP: 13 (ref 5–15)
BUN: 33 mg/dL — ABNORMAL HIGH (ref 6–20)
CALCIUM: 8.1 mg/dL — AB (ref 8.9–10.3)
CO2: 32 mmol/L (ref 22–32)
CREATININE: 0.68 mg/dL (ref 0.44–1.00)
Chloride: 94 mmol/L — ABNORMAL LOW (ref 101–111)
Glucose, Bld: 96 mg/dL (ref 65–99)
Potassium: 3.7 mmol/L (ref 3.5–5.1)
SODIUM: 139 mmol/L (ref 135–145)

## 2017-08-15 LAB — GLUCOSE, CAPILLARY
GLUCOSE-CAPILLARY: 93 mg/dL (ref 65–99)
GLUCOSE-CAPILLARY: 98 mg/dL (ref 65–99)
GLUCOSE-CAPILLARY: 85 mg/dL (ref 65–99)
Glucose-Capillary: 81 mg/dL (ref 65–99)
Glucose-Capillary: 84 mg/dL (ref 65–99)
Glucose-Capillary: 90 mg/dL (ref 65–99)

## 2017-08-15 NOTE — Progress Notes (Signed)
Social Work Assessment and Plan  Patient Details  Name: [name] MRN: [id number] Date of Birth: 03/15/1941  Today's Date: 08/15/2017  Problem List:  Patient Active Problem List   Diagnosis Date Noted  . Debility 08/12/2017  . PAF (paroxysmal atrial fibrillation) ([place])   . Pressure injury of skin 08/04/2017  . Tracheostomy in place [facility])   . Acute encephalopathy   . HCAP (healthcare-associated pneumonia)   . Acute respiratory failure ([place])   . RVF (right ventricular failure) ([place])   . RVAD (right ventricular assist device) present ([place])   . Cardiogenic shock ([place])   . S/P AVR 07/10/2017  . Nephrolithiasis 04/07/2017  . Vitamin B 12 deficiency 03/01/2016  . Ascending aortic aneurysm ([place]) 08/20/2015  . Thyroid cancer ([place]) 07/30/2015  . Left thyroid nodule 07/27/2015  . Osteoporosis with fracture 04/16/2014  . At high risk for falls 04/16/2014  . Proximal humerus fracture 05/30/2011  . CHEST PAIN, PRECORDIAL 02/26/2010  . Hyperlipidemia 02/23/2010  . Depression 02/23/2010  . GLAUCOMA 02/23/2010  . CATARACTS 02/23/2010  . RHEUMATIC FEVER 02/23/2010  . Essential hypertension 02/23/2010  . MITRAL VALVE PROLAPSE 02/23/2010  . GERD 02/23/2010  . Osteoarthritis 02/23/2010  . Primary fibromyalgia syndrome 02/23/2010   Past Medical History:  Past Medical History:  Diagnosis Date  . ACL tear    right  Dr. Gladstone Pih   . Adenomatous polyp   . Anxiety   . Aorta aneurysm ([place])   . Arthritis   . Ascending aortic aneurysm ([place])    note per chart per Dr Lucianne Lei Tright 4.7 cm 04/15/2015   . B12 deficiency   . Cancer ([place])   . Cataracts, both eyes 10/2006  . Chronic bronchitis ([place])   . Constipation   . Depression   . DUB (dysfunctional uterine bleeding) 10/96  . ETD (eustachian tube dysfunction)   . Fall   . Fibromyalgia   . GERD (gastroesophageal reflux disease)   . Glaucoma    ([place]) Dr. Arnoldo Morale   . Glaucoma    bilaterally  . Helicobacter pylori (H. pylori)   . Hiatal  hernia   . History of bronchitis   . History of frequent urinary tract infections   . History of kidney stones   . History of left shoulder fracture    pt states fell off bed and broke ball in shoulder had rod placed   . Hyperlipidemia   . Hypertension   . Hypothyroidism   . Insomnia   . Leg wound, left    healed   . MVP (mitral valve prolapse)   . Occasional tremors    left arm   . Osteoarthritis   . Osteopenia   . Osteoporosis   . Other and unspecified hyperlipidemia   . Post-menopausal   . Thyroid cancer ([place])   . Thyroid nodule   . Tremor    Past Surgical History:  Past Surgical History:  Procedure Laterality Date  . AORTIC VALVE REPLACEMENT N/A 07/10/2017   Procedure: AORTIC VALVE REPLACEMENT (AVR);  Surgeon: [name], Collier Salina, MD;  Location: [place];  Service: Open Heart Surgery;  Laterality: N/A;  . APPENDECTOMY    . BUNIONECTOMY  08/1999   right - Dr. Irving Shows   . CHOLECYSTECTOMY  1979  . DILATION AND CURETTAGE OF UTERUS  03/31/95   Dr. Ovid Curd   . EXPLORATION POST OPERATIVE OPEN HEART N/A 07/11/2017   Procedure: EXPLORATION POST OPERATIVE OPEN HEART;  Surgeon: Ivin Poot, MD;  Location: [place];  Service:  Open Heart Surgery;  Laterality: N/A;  . EYE SURGERY     also had left cataract removed   . FRACTURE SURGERY    . HEMATOMA EVACUATION N/A 07/11/2017   Procedure: EVACUATION HEMATOMA;  Surgeon: Ivin Poot, MD;  Location: [place];  Service: Open Heart Surgery;  Laterality: N/A;  . HERNIA REPAIR    . NEPHROLITHOTOMY Left 04/07/2017   Procedure: NEPHROLITHOTOMY PERCUTANEOUS WITH SURGEON ACCESS;  Surgeon: Ardis Hughs, MD;  Location[facility];  Service: Urology;  Laterality: Left;  . ORIF HUMERUS FRACTURE  05/30/2011   Procedure: OPEN REDUCTION INTERNAL FIXATION (ORIF) PROXIMAL HUMERUS FRACTURE;  Surgeon: Augustin Schooling;  Location: [place];  Service: Orthopedics;  Laterality: Left;  open reduction internal fixation of proximal humerus fracture  . PLACEMENT OF  CENTRIMAG VENTRICULAR ASSIST DEVICE Right 07/11/2017   Procedure: PLACEMENT OF CENTRIMAG VENTRICULAR ASSIST DEVICE;  Surgeon: Ivin Poot, MD;  Location: [place];  Service: Open Heart Surgery;  Laterality: Right;  . REMOVAL OF CENTRIMAG VENTRICULAR ASSIST DEVICE N/A 07/17/2017   Procedure: REMOVAL OF RVAD WITH PUMP STANDBY;  Surgeon: Ivin Poot, MD;  Location: [place];  Service: Open Heart Surgery;  Laterality: N/A;  . REPLACEMENT ASCENDING AORTA N/A 07/10/2017   Procedure: REPLACEMENT ASCENDING AORTA;  Surgeon: Ivin Poot, MD;  Location: [place];  Service: Open Heart Surgery;  Laterality: N/A;  . right knee replacement  3/11   Dr. Gladstone Pih  . RIGHT/LEFT HEART CATH AND CORONARY ANGIOGRAPHY N/A 06/13/2017   Procedure: RIGHT/LEFT HEART CATH AND CORONARY ANGIOGRAPHY;  Surgeon: Jolaine Artist, MD;  Location: [facility];  Service: Cardiovascular;  Laterality: N/A;  . rt. eye cataract  05/28/07  . TEE WITHOUT CARDIOVERSION N/A 07/10/2017   Procedure: TRANSESOPHAGEAL ECHOCARDIOGRAM (TEE);  Surgeon: [name], Collier Salina, MD;  Location: [place];  Service: Open Heart Surgery;  Laterality: N/A;  . TEE WITHOUT CARDIOVERSION  07/11/2017   Procedure: TRANSESOPHAGEAL ECHOCARDIOGRAM (TEE);  Surgeon: [name], Collier Salina, MD;  Location: [place];  Service: Open Heart Surgery;;  . TEE WITHOUT CARDIOVERSION N/A 07/17/2017   Procedure: TRANSESOPHAGEAL ECHOCARDIOGRAM (TEE);  Surgeon: [name], Collier Salina, MD;  Location: [place];  Service: Open Heart Surgery;  Laterality: N/A;  . THYROID LOBECTOMY Left 07/27/2015   Procedure: LEFT THYROID LOBECTOMY;  Surgeon: Fanny Skates, MD;  [facility];  Service: General;  Laterality: Left;  . THYROIDECTOMY N/A 08/06/2015   Procedure: RIGHT THYROID LOBECTOMY, REIMPLANTATION PARATHYROID;  Surgeon: Fanny Skates, MD;  [facility];  Service: General;  Laterality: N/A;  . VENTRAL HERNIA REPAIR  2/99   Social History:  reports that  has never smoked. she has never used  smokeless tobacco. She reports that she does not drink alcohol or use drugs.  Family / Support Systems Marital Status: Married How Long?: 6 years Patient Roles: Spouse, Parent Spouse/Significant Other: Saranne Crislip - husband - [phone] Children: [name] - dtr - [phone]; son-in-law Barnabas Lister) Anticipated Caregiver: Spouse and others as needed Ability/Limitations of Caregiver: None Caregiver Availability: 24/7 Family Dynamics: close, supportive family  Social History Preferred language: English Religion: Baptist Read: Yes Write: Yes Employment Status: Retired Public relations account executive Issues: none reported Guardian/Conservator: N/A   Abuse/Neglect Abuse/Neglect Assessment Can Be Completed: Yes Physical Abuse: Denies Verbal Abuse: Denies Sexual Abuse: Denies Exploitation of patient/patient's resources: Denies Self-Neglect: Denies  Emotional Status Pt's affect, behavior and adjustment status: Pt is in good spirits, but reports at night when she is alone, she feels  more tired. Recent Psychosocial Issues: prolonged hospitalization with multiple medical complications Psychiatric History: Pt with some anxiety now, nothing reported PTA. Substance Abuse History: none reported  Patient / Family Perceptions, Expectations & Goals Pt/Family understanding of illness & functional limitations: Pt's husband has a good understanding of pt's condition/limitations.  Pt has no awareness of her deficits. Premorbid pt/family roles/activities: Pt takes pride in keeping her home clean.  She likes spending time with her dog. Anticipated changes in roles/activities/participation: Pt would like to resume activities as she is able.s Pt/family expectations/goals: Pt wants to get back to her dog and taking care of her home.  Community [employer] Agencies: None Premorbid Home Care/DME Agencies: Other (Comment)(Pt has a cane, RW, rollator, built-in-shower bench, BSC.  Has been  to [facility] for PT in the past.) Transportation available at discharge: husband Resource referrals recommended: Neuropsychology  Discharge Planning Living Arrangements: Spouse/significant other Support Systems: Spouse/significant other, Children, Other relatives, Friends/neighbors Type of Residence: Private residence Insurance Resources: Multimedia programmer (specify)([employer]) Museum/gallery curator Resources: Fish farm manager, Family Support Financial Screen Referred: No Money Management: Patient, Spouse Does the patient have any problems obtaining your medications?: No Home Management: Pt and husband share the household chores.  Pt likes to make sure the house is clean. Patient/Family Preliminary Plans: Pt to return to her home with her husband to provide supervision and assistance, as needed.  Other family members to provide respite for husband to leave the house when he needs to. Social Work Anticipated Follow Up Needs: HH/OP Expected length of stay: 3 weeks  Clinical Impression CSW met with pt and her husband to introduce self and role of CSW, as well as to complete assessment.  Pt was talkative with CSW, but would at times look to husband to answer CSW's question or for him to verify her answer.  She was appropriate in many answers, but some deficits were noticeable in our conversation.  Pt's husband plans to be with her at d/c and has already been helping her with bathing.  He plans to have other family members assist him to give him time to leave the house to do what he needs to do.  He still works a couple days a week, but this is flexible and he will see what pt needs.  Pt is eager to get home, but does understand somewhat why it is important for her to be here.  Pt and husband joke with each other and seem to have a good relationship.  Pt has completed outpt PT in the past for her shoulder and knees, but they only do PT there, so pt may need to go to [facility] for their outpt  program or [facility].  Husband can take her to outpt.  No current concerns/needs/questions, but CSW will continue to follow and assist as needed.  Monica Zahler, Silvestre Mesi 08/15/2017, 3:18 PM

## 2017-08-15 NOTE — Progress Notes (Signed)
Inpatient [facility] Individual Statement of Services  Patient Name:  [name]  Date:  08/15/2017  Welcome to the [facility].  Our goal is to provide you with an individualized program based on your diagnosis and situation, designed to meet your specific needs.  With this comprehensive rehabilitation program, you will be expected to participate in at least 3 hours of rehabilitation therapies Monday-Friday, with modified therapy programming on the weekends.  Your rehabilitation program will include the following services:  Physical Therapy (PT), Occupational Therapy (OT), Speech Therapy (ST), 24 hour per day rehabilitation nursing, Neuropsychology, Case Management (Social Worker), Rehabilitation Medicine, Nutrition Services and Pharmacy Services  Weekly team conferences will be held on Tuesdays to discuss your progress.  Your Social Worker will talk with you frequently to get your input and to update you on team discussions.  Team conferences with you and your family in attendance may also be held.  Expected length of stay:  18 to 21 days  Overall anticipated outcome:  Supervision overall with minimal assistance for shower transfers, stairs, and bed mobility  Depending on your progress and recovery, your program may change. Your Social Worker will coordinate services and will keep you informed of any changes. Your Social Worker's name and contact numbers are listed  below.  The following services may also be recommended but are not provided by the [facility] will be made to provide these services after discharge if needed.  Arrangements include referral to agencies that provide these services.  Your insurance has been verified to be:  [employer] Your primary doctor is:  Dr. Redge Gainer  Pertinent information will be  shared with your doctor and your insurance company.  Social Worker:  Alfonse Alpers, [name]  [phone] or [phone]  Information discussed with and copy given to patient by: Trey Sailors, 08/15/2017, 1:38 PM

## 2017-08-15 NOTE — Progress Notes (Signed)
[facility] PHYSICAL MEDICINE & REHABILITATION     PROGRESS NOTE    Subjective/Complaints: Had a reasonable night.  No secretions at times.  Feels that her breathing is showing some signs of improvement.  Happy to be off some of tube feeds.  ROS: pt denies nausea, vomiting, diarrhea, cough, shortness of breath or chest pain   Objective: Vital Signs:  Blood pressure 126/63, pulse 88, temperature 98.1 F (36.7 C), temperature source Axillary, resp. rate 18, height 5\' 5"  (1.651 m), weight 74.4 kg (164 lb 0.4 oz), SpO2 96 %. No results found. Recent Labs    08/12/17 1716 08/14/17 0631  WBC 8.9 6.8  HGB 12.7 11.9*  HCT 41.2 38.6  PLT 246 209   Recent Labs    08/14/17 0631 08/15/17 0518  NA 141 139  K 3.7 3.7  CL 97* 94*  GLUCOSE 129* 96  BUN 33* 33*  CREATININE 0.61 0.68  CALCIUM 7.9* 8.1*   CBG (last 3)  Recent Labs    08/14/17 2339 08/15/17 0333 08/15/17 0806  GLUCAP 93 93 90    Wt Readings from Last 3 Encounters:  08/15/17 74.4 kg (164 lb 0.4 oz)  08/12/17 73.8 kg (162 lb 11.2 oz)  07/06/17 76.5 kg (168 lb 11.2 oz)    Physical Exam:  HENT:  Head: Normocephalic.  Nasogastric tube in place.   Eyes:  Pupils round and reactive to light  Neck:  Trach tube in place. #4 with PMV--in place mild 10 thick secretions   Cardiac: RRR with murmur. No JVD    Respiratory: CTA Bilaterally without wheezes or rales. Normal effort    GI: Soft. Bowel sounds are normal. She exhibits no distension. There is no tenderness. There is no rebound.  Musculoskeletal: She exhibits no tenderness or deformity.  Neurological:  Sitting up in chair. Makes good eye contact with examiner. Followed full commands. Provides her name agent date of birth. Moves all extremities. UE 4- to 4/5 prox to distal. LE:   2+ HF, 3- KE and 3+ ADF/PF. No focal sensory deficits --motor and sensory exam stable Psychiatric: Patient pleasant and cooperative     Assessment/Plan: 1.  Functional deficits  secondary to debility which require 3+ hours per day of interdisciplinary therapy in a comprehensive inpatient rehab setting. Physiatrist is providing close team supervision and 24 hour management of active medical problems listed below. Physiatrist and rehab team continue to assess barriers to discharge/monitor patient progress toward functional and medical goals.  Function:  Bathing Bathing position Bathing activity did not occur: Refused(Pt refused bathing on eval stating nursing assisted prior to tx session)    Bathing parts      Bathing assist        Upper Body Dressing/Undressing Upper body dressing   What is the patient wearing?: Hospital gown, Button up shirt           Button up shirt - Perfomed by helper: Thread/unthread right sleeve, Pull shirt around back, Button/unbutton shirt, Thread/unthread left sleeve    Upper body assist Assist Level: Touching or steadying assistance(Pt > 75%)      Lower Body Dressing/Undressing Lower body dressing   What is the patient wearing?: Pants, Hospital Gown, Socks, Shoes       Pants- Performed by helper: Thread/unthread right pants leg, Thread/unthread left pants leg, Pull pants up/down Non-skid slipper socks- Performed by patient: Don/doff left sock Non-skid slipper socks- Performed by helper: Don/doff right sock   Socks - Performed by helper: Don/doff right sock,  Don/doff left sock   Shoes - Performed by helper: Don/doff right shoe, Don/doff left shoe, Fasten right, Fasten left          Lower body assist        Toileting Toileting     Toileting steps completed by helper: Adjust clothing prior to toileting, Performs perineal hygiene, Adjust clothing after toileting    Toileting assist Assist level: Two helpers   Transfers Chair/bed transfer   Chair/bed transfer method: Stand pivot Chair/bed transfer assist level: Moderate assist (Pt 50 - 74%/lift or lower) Chair/bed transfer assistive device: Environmental health practitioner     Max distance: 5 ft Assist level: Maximal assist (Pt 25 - 49%)   Wheelchair Wheelchair activity did not occur: Safety/medical concerns Type: Manual      Cognition Comprehension Comprehension assist level: Understands basic 25 - 49% of the time/ requires cueing 50 - 75% of the time  Expression Expression assist level: Expresses basic 75 - 89% of the time/requires cueing 10 - 24% of the time. Needs helper to occlude trach/needs to repeat words.  Social Interaction Social Interaction assist level: Interacts appropriately 50 - 74% of the time - May be physically or verbally inappropriate.  Problem Solving Problem solving assist level: Solves basic 50 - 74% of the time/requires cueing 25 - 49% of the time  Memory Memory assist level: Recognizes or recalls 75 - 89% of the time/requires cueing 10 - 24% of the time  Medical Problem List and Plan:  1. Debility secondary to debility related to abdominal aneurysm/AS with AVR 07/10/2017 status post repair and subsequent cardiogenic shock  -Continue therapies PT, OT, speech -Team conference today 2. DVT Prophylaxis/Anticoagulation: Subcutaneous Lovenox. Check vascular study  3. Pain Management: Tylenol as needed  4. Mood: Klonopin 1 mg daily at bedtime, Seroquel 75 mg bedtime, Effexor 37.5 mg twice a day    -Klonopin as needed during the day for anxiety  5. Neuropsych: This patient is capable of making decisions on her own behalf.  6. Skin/Wound Care: Routine skin checks  7. Fluids/Electrolytes/Nutrition: We will discontinue scheduled continuous tube feeds and provide free water through the tube as well as protein supplements only to help stimulate appetite.  Currently only on D1 and pudding liquids   -Labs reviewed this morning.  BUN slightly elevated but stable.  Continue current free water flushes 8. Tracheostomy 07/25/2017. Downsized to #4 cuffless 08/08/2017. No current plan to decannulate due to failure with capping.  Follow-up per Dr. Nelda Marseille.    -Continue nebulizers, trach care and suction   -Guaifenesin to help with secretions   -If patient does well today with therapy and with Passy-Muir valve,  9. Atrial fibrillation. Amiodarone 200 mg daily. Cardiac rate controlled  10. Dysphagia. Dysphagia #1 pudding liquids. See above 11. Acute diastolic congestive heart failure. Lasix 40 mg daily, Aldactone 25 mg daily. Monitor for any signs of fluid overload    -continue current diuretics.  BUN stable today Filed Weights   08/13/17 0042 08/14/17 0010 08/15/17 0004  Weight: 74.4 kg (164 lb 0.4 oz) 74.1 kg (163 lb 6.6 oz) 74.4 kg (164 lb 0.4 oz)   12. Hypothyroidism. Synthroid  13. Hyperglycemia secondary to tube feeds. --these have been stopped     LOS (Days) 3 A FACE TO FACE EVALUATION WAS PERFORMED  Meredith Staggers, MD 08/15/2017 9:04 AM

## 2017-08-15 NOTE — Progress Notes (Signed)
Speech Language Pathology Daily Session Note  Patient Details  Name: [name] MRN: [id number] Date of Birth: May 31, 1941  Today's Date: 08/15/2017 SLP Individual Time: 1405-1500 SLP Individual Time Calculation (min): 55 min  Short Term Goals: Week 1: SLP Short Term Goal 1 (Week 1): Pt will donn and doff PMSV with Max A cues.  SLP Short Term Goal 2 (Week 1): Pt will utilize external memory aid to recall basic information with Max A cues (such as swallowing/diet recommendation information).  SLP Short Term Goal 3 (Week 1): Pt will demonstrate basic problem solving with familiar tasks and Mod A cues.  SLP Short Term Goal 4 (Week 1): Pt will demonstrate sustained attention for ~ 30 minutes with supervision cues.  SLP Short Term Goal 5 (Week 1): Pt will consume ice chips without overt s/s of aspiration to demonstrate readiness for repeat swallow study.   Skilled Therapeutic Interventions:   Pt was seen for skilled ST targeting goals for dysphagia and cognition.  SLP facilitated the session with trials of ice chips following oral care to continue working towards diet progression.  Pt demonstrated multiple swallows and delayed coughing with trials which could be related to presence of NG tube and/or persistent motor weakness.  Would recommend proceeding with MBS tomorrow to determine readiness to advance diet.  SLP also  facilitated the session with a novel card game targeting problem solving, attention, and memory goals.  Pt completed task with mod-max assist for working memory for mental math calculations as well as for task rules and procedures.  Pt sustained her attention to task for ~5 minute intervals with supervision cues for redirection to task.   Pt was left in wheelchair with husband at bedside and call bell within reach.  Continue per current plan of care.     Function:  Eating Eating   Modified Consistency Diet: Yes Eating Assist Level: More than reasonable amount of time          Cognition Comprehension Comprehension assist level: Understands complex 90% of the time/cues 10% of the time  Expression Expression assistive device: Talk trach valve Expression assist level: Expresses basic 90% of the time/requires cueing < 10% of the time.  Social Interaction Social Interaction assist level: Interacts appropriately 90% of the time - Needs monitoring or encouragement for participation or interaction.  Problem Solving Problem solving assist level: Solves basic 75 - 89% of the time/requires cueing 10 - 24% of the time  Memory Memory assist level: Recognizes or recalls 50 - 74% of the time/requires cueing 25 - 49% of the time    Pain Pain Assessment Pain Assessment: No/denies pain  Therapy/Group: Individual Therapy  Kaliegh Willadsen, Selinda Orion 08/15/2017, 4:02 PM

## 2017-08-15 NOTE — Progress Notes (Signed)
Bilateral lower extremity venous duplex completed. There is no evidence of a DVT. Positive for a small age indeterminate superficial thrombosis of a greater saphenous veins just distal to the saphenofemoral junction on the right. There is no evidence of a Baker's cyst. Toma Copier, RVS 08/15/2017 10:00 AM

## 2017-08-15 NOTE — Patient Care Conference (Signed)
Inpatient RehabilitationTeam Conference and Plan of Care Update Date: 08/15/2017   Time: 2:00 PM    Patient Name: [name]      Medical Record Number: [id number]  Date of Birth: [birth year] Sex: Female         Room/Bed: 4W18C/4W18C-01 Payor Info: Payor: [place] / Plan: BCBS MEDICARE / Product Type: *No Product type* /    Admitting Diagnosis: debility  Admit Date/Time:  08/12/2017  4:11 PM Admission Comments: No comment available   Primary Diagnosis:  <principal problem not specified> Principal Problem: <principal problem not specified>  Patient Active Problem List   Diagnosis Date Noted  . Debility 08/12/2017  . PAF (paroxysmal atrial fibrillation) ([place])   . Pressure injury of skin 08/04/2017  . Tracheostomy in place [facility])   . Acute encephalopathy   . HCAP (healthcare-associated pneumonia)   . Acute respiratory failure ([place])   . RVF (right ventricular failure) ([place])   . RVAD (right ventricular assist device) present ([place])   . Cardiogenic shock ([place])   . S/P AVR 07/10/2017  . Nephrolithiasis 04/07/2017  . Vitamin B 12 deficiency 03/01/2016  . Ascending aortic aneurysm ([place]) 08/20/2015  . Thyroid cancer ([place]) 07/30/2015  . Left thyroid nodule 07/27/2015  . Osteoporosis with fracture 04/16/2014  . At high risk for falls 04/16/2014  . Proximal humerus fracture 05/30/2011  . CHEST PAIN, PRECORDIAL 02/26/2010  . Hyperlipidemia 02/23/2010  . Depression 02/23/2010  . GLAUCOMA 02/23/2010  . CATARACTS 02/23/2010  . RHEUMATIC FEVER 02/23/2010  . Essential hypertension 02/23/2010  . MITRAL VALVE PROLAPSE 02/23/2010  . GERD 02/23/2010  . Osteoarthritis 02/23/2010  . Primary fibromyalgia syndrome 02/23/2010    Expected Discharge Date: Expected Discharge Date: 09/02/17  Team Members Present: Physician leading conference: Dr. Alger Simons Social Worker Present: Alfonse Alpers, [name] Nurse Present: Dorien Chihuahua, RN PT Present: Kem Parkinson,  PT OT Present: Roanna Epley, [place], OT SLP Present: Charolett Bumpers, SLP PPS Coordinator present : Daiva Nakayama, RN, CRRN     Current Status/Progress Goal Weekly Team Focus  Medical   Debility after aortic valve replacement and respiratory failure.  Patient on D1 diet only.  He still has Passy-Muir valve in place and may not be able to be weaned off of her trach prior to going home  Maximize breathing and swallowing  Respiratory management, improved secretion management.  Ongoing nutritional concerns and considerations   Bowel/Bladder   Contient of bowel/bladder with some bladder urgency. LBM 08/13/17  To remain continent of bowel/bladder with min assist  Assess bowel/bladder function q shift and as needed   Swallow/Nutrition/ Hydration   Dys 1 and pudding thick, ice chip trials   Supervision A  MBS 2/20   ADL's   bathing-mod A; UB dressing-min A; LB dressing-tot A; functional transfers-max A; limited awareness of deficits  supervision overall  BADL retraining, functional transfers, standing balance, cognitive remediation, education   Mobility   mod/maxA bed mobility and sit <>stand, minA gait with RW up to 20'   S overall, minA bed mobility and stairs  activity tolerance, LE strengthening, transfer/gait training, ongoing education   Communication             Safety/Cognition/ Behavioral Observations  Max A  MIn A, Supervision A  using PMSV, recall precautions and  basic problem solving    Pain   No complain of pain  <2  Assessed and treat pain q shift and as needed   Skin   Multiple skin  tear to both leg with thin film dsg, shire to bott OTA, sutures to groing, Surgical site to the chest OTA  Skin to be free from breakdown/infection with min assiost  Assess skin q shift and as needed    Rehab Goals Patient on target to meet rehab goals: Yes Rehab Goals Revised: none - Pt's first conference *See Care Plan and progress notes for long and short-term goals.     Barriers to  Discharge  Current Status/Progress Possible Resolutions Date Resolved   Physician             Wean trach as able.  May need to follow-up for trach with pulmonary after discharge      Nursing  Trach               PT  Inaccessible home environment;Decreased caregiver support;New oxygen                 OT                  SLP Trach difficulty with capping trials            SW                Discharge Planning/Teaching Needs:  Pt plans to return to her home where her husband and other family members will provide supervision.  Husband can come for family education closer to d/c.   Team Discussion:  Pt with planned AVR, then multiple complications, leading to trach.  Pt has some anxiety.  Dr. Naaman Plummer plans to try to plug trach.  Tube feeds have been stopped except for protein and pt still receiving fluids, especially since she is on D1 diet with pudding thick liquids.  ST plans a MBS tomorrow.  Pt with passy muir.  Pt/family will need instruction from speech therapist and they will work on problem solving and awareness.  Pt is incontinent of urine at times due to urgency, but continent of bowel.  Pt's CBGs are good with stopping tube feeds.  Pt with OT/PT supervision level goals except min A for stairs, bed mobility, and shower txs.  Pt currently mod/max A with ADLs and lacks awareness of deficits.  Pt is mod/max with PT until she is up and then walked 20' min guard.  She is limited by endurance.    Revisions to Treatment Plan:  none    Continued Need for Acute Rehabilitation Level of Care: The patient requires daily medical management by a physician with specialized training in physical medicine and rehabilitation for the following conditions: Daily direction of a multidisciplinary physical rehabilitation program to ensure safe treatment while eliciting the highest outcome that is of practical value to the patient.: Yes Daily medical management of patient stability for increased activity during  participation in an intensive rehabilitation regime.: Yes Daily analysis of laboratory values and/or radiology reports with any subsequent need for medication adjustment of medical intervention for : Post surgical problems;Pulmonary problems;Nutritional problems  Mystie Ormand, Silvestre Mesi 08/15/2017, 2:13 PM

## 2017-08-15 NOTE — Progress Notes (Signed)
Social Work Patient ID: [name], female   DOB: 05-23-1941, 76 y.o.   MRN: [id number]   CSW met with pt and her husband to update them on team conference discussion and targeted d/c date of 09-02-17.  Pt questioned why she needed to be here this long, but CSW explained that there are a lot of things we can work on and she agreed.  Pt's husband agrees with the amount of time pt is scheduled to be on CIR.  No further questions.  CSW will continue to follow and assist as needed.

## 2017-08-15 NOTE — Progress Notes (Signed)
Pt requested PRN neb 2' some Sob.  Noted small amount of blood tinged secretions.  Suction for scant amount of same.  Pt able to cough up small amount blood tinged secretions around trach at stoma.  Clear/white thin secretions into mouth.  IC cleaned for small amount blood tinged residue.  Pt able to phonate better with PMV post treatment, and states breathing is better.  Continue to monitor closely.

## 2017-08-15 NOTE — Progress Notes (Signed)
RT here to assess patient and patient off floor with therapy.

## 2017-08-15 NOTE — Progress Notes (Signed)
Occupational Therapy Session Note  Patient Details  Name: [name] MRN: [id number] Date of Birth: 1941/02/19  Today's Date: 08/15/2017 OT Individual Time: 1030-1130 OT Individual Time Calculation (min): 60 min    Short Term Goals: Week 1:  OT Short Term Goal 1 (Week 1): Pt will recall and utilize sternal pre-cautions during functional tasks with mod A OT Short Term Goal 2 (Week 1): Pt will complete stand pivot transfer to BSC/ toilet with mod A OT Short Term Goal 3 (Week 1): Pt will complete 1/3 toileting tasks with mod steadying assist in order to decrease caregiver burden OT Short Term Goal 4 (Week 1): Pt will don shirt with min A  Skilled Therapeutic Interventions/Progress Updates:    OT intervention with focus on dressing, sit<>stand, and standing balance to increase independence with BADLs.  Pt required mod A for UB dressing with button up shirt and tot A for LB dressing tasks.  Pt practiced sit<>stand X 6 with assistance varying from max A to min A.  Pt continues to exhibit significant posterior lean but can correct with facilitation and max verbal cues.  Pt fatigues quickly and requires multiple rest breaks.  Pt requires max A to follow sternal precautions. Pt remained in w/c with husband present.   Therapy Documentation Precautions:  Precautions Precautions: Fall, Sternal Precaution Comments: Reinforced sternal precautions Restrictions Weight Bearing Restrictions: (sternal precautions) Other Position/Activity Restrictions: sternal precautions Pain: Pain Assessment Pain Assessment: No/denies pain  See Function Navigator for Current Functional Status.   Therapy/Group: Individual Therapy  Leroy Libman 08/15/2017, 12:25 PM

## 2017-08-15 NOTE — Progress Notes (Signed)
Physical Therapy Session Note  Patient Details  Name: [name] MRN: [id number] Date of Birth: [birth year]  Today's Date: 08/15/2017 PT Individual Time: 1300-1400 PT Individual Time Calculation (min): 60 min   Short Term Goals: Week 1:  PT Short Term Goal 1 (Week 1): Pt will perform <>stand with mod assist while maintaining sternal precautions PT Short Term Goal 2 (Week 1): Pt will perform stand pivot transfer with min assist and use of RW PT Short Term Goal 3 (Week 1): Pt will ambualted x 50 ft with RW and mod assist  Skilled Therapeutic Interventions/Progress Updates:    Pt greeted lying supine in bed, reports she is exhausted but agreeable to therapy with encouragement. Case manager present to talk with pt and husband, therapist used time to educate pt on sternal precaution handout, pt verbalizes understanding and can recall precautions however needs reinforcement during bed mobility and transfers as pt demonstrates difficulty to maintain.Therapist donned tennis shoes with totalA in supine to maintain precautions, pt transitions supine to sitting EOB with minA for trunk and vcing to not push/pull with UE. Pt transfers bed>WC using stand pivot technique using RW with modA to boost, bed elevated, vcing to turn feet all the way before sitting. Pt dependently transported to therapy gym, transfer WC<>mat using RW stand pivot technique with modA and 2 trials to complete sit>stand from [place]. Sitting edge of mat, pt performs 5 x sit<>stnad using RW with S/minA, pt requires seated rest break between d/t SOB SatO2 maintained at 96-98% throughout session, decreased rest break during last 2 sit>stands emphasizing endurance. Pt ambulates 59ft using RW with modA to boost, min guard once ambulating, decreased gait velocity, vcing required to increase BOS, gait distance limited by fatigue. Pt states she needs to hae a nowel movement, returned to room in [facility], once in room pt refuses to use the toilet and requests to  get in bed. Agrees to seated BLE exercises, performs 1 x 10 reps of seated marching, LAQ, adduction pillow squeezes, heel slides, heel/toe raises. Nursing staff enters session to obtain vitals. At end of session, pt left sitting upright in manual WC, denies any needs at this time.   Therapy Documentation Precautions:  Precautions Precautions: Fall, Sternal Precaution Comments: Reinforced sternal precautions Restrictions Weight Bearing Restrictions: (sternal precautions) Other Position/Activity Restrictions: sternal precautions Vital Signs: Therapy Vitals Temp: 98.3 F (36.8 C) Temp Source: Oral Pulse Rate: 85 Resp: 20 BP: 125/77 Patient Position (if appropriate): Lying Oxygen Therapy SpO2: 94 % O2 Device: Tracheostomy Collar   See Function Navigator for Current Functional Status.   Therapy/Group: Individual Therapy  Salvatore Decent 08/15/2017, 3:23 PM

## 2017-08-15 NOTE — Progress Notes (Signed)
Patient information reviewed and entered into eRehab system by Taylan Marez, RN, CRRN, PPS Coordinator.  Information including medical coding and functional independence measure will be reviewed and updated through discharge.     Per nursing patient was given "Data Collection Information Summary for Patients in Inpatient Rehabilitation Facilities with attached "Privacy Act Statement-Health Care Records" upon admission.  

## 2017-08-15 NOTE — Significant Event (Signed)
Hypoglycemic Event  CBG: 67  Treatment: 15 GM carbohydrate snack  Symptoms: None  Follow-up CBG: Time:2026 CBG Result:150  Possible Reasons for Event: Unknown  Comments/MD notified:yes    Maelin Kurkowski  Silverio Lay

## 2017-08-15 NOTE — Progress Notes (Signed)
Physical Therapy Session Note  Patient Details  Name: [name] MRN: [id number] Date of Birth: February 11, 1941  Today's Date: 08/15/2017 PT Individual Time: 1015-1030 PT Individual Time Calculation (min): 15 min  and Today's Date: 08/15/2017 PT Missed Time: 15 Minutes Missed Time Reason: Nursing care  Short Term Goals: Week 1:  PT Short Term Goal 1 (Week 1): Pt will perform <>stand with mod assist while maintaining sternal precautions PT Short Term Goal 2 (Week 1): Pt will perform stand pivot transfer with min assist and use of RW PT Short Term Goal 3 (Week 1): Pt will ambualted x 50 ft with RW and mod assist  Skilled Therapeutic Interventions/Progress Updates:   Pt missed 15 min of skilled PT 2/2 nursing care at beginning of session. No c/o pain and agreeable to therapy afterwards, O2 95% at rest. Focused on functional transfer OOB and breathing techniques for O2 management. Transitioned to EOB w/ mod assist, O2 90% w/ transfer. Verbal cues for breathing at EOB prior to transferring to w/c via stand pivot w/ RW, mod assist overall for transfer and to boost into standing. Pt w/ increased posterior lean during transfer, verbal cues for lines and leads avoidance, pt sat down quickly on O2 tubing. Total assist to adjust tubing. O2 down to 86% after transfer, up to 90% after a few seconds in sitting. Required 5-6 min of deep breathing to return to baseline at 96%. Continued verbal cues for breathing. Ended session in w/c and in care of RN, all needs met.   Therapy Documentation Precautions:  Precautions Precautions: Fall, Sternal Precaution Comments: Reinforced sternal precautions Restrictions Weight Bearing Restrictions: No Other Position/Activity Restrictions: sternal precautions  See Function Navigator for Current Functional Status.   Therapy/Group: Individual Therapy  Irene Collings K Arnette 08/15/2017, 10:35 AM

## 2017-08-15 NOTE — Significant Event (Deleted)
Hypoglycemic Event  CBG: 67  Treatment: 15 GM carbohydrate snack  Symptoms: None  Follow-up CBG: Time:2026 CBG Result:150  Possible Reasons for Event: Unknown  Comments/MD notified:yes    [name]  [name]

## 2017-08-16 ENCOUNTER — Encounter (HOSPITAL_COMMUNITY): Payer: Medicare Other | Admitting: Speech Pathology

## 2017-08-16 ENCOUNTER — Inpatient Hospital Stay (HOSPITAL_COMMUNITY): Payer: Medicare Other | Admitting: Occupational Therapy

## 2017-08-16 ENCOUNTER — Inpatient Hospital Stay (HOSPITAL_COMMUNITY): Payer: Medicare Other

## 2017-08-16 ENCOUNTER — Inpatient Hospital Stay (HOSPITAL_COMMUNITY): Payer: Medicare Other | Admitting: Speech Pathology

## 2017-08-16 ENCOUNTER — Inpatient Hospital Stay (HOSPITAL_COMMUNITY): Payer: Medicare Other | Admitting: Physical Therapy

## 2017-08-16 LAB — GLUCOSE, CAPILLARY
Glucose-Capillary: 72 mg/dL (ref 65–99)
Glucose-Capillary: 86 mg/dL (ref 65–99)

## 2017-08-16 NOTE — Progress Notes (Signed)
[place] PHYSICAL MEDICINE & REHABILITATION     PROGRESS NOTE    Subjective/Complaints: Occasional anxiety and sob. Confused at times  ROS: pt denies nausea, vomiting, diarrhea,  chest pain    Objective: Vital Signs:  Blood pressure 132/66, pulse 84, temperature 99 F (37.2 C), temperature source Oral, resp. rate 18, height 5\' 5"  (1.651 m), weight 74.3 kg (163 lb 12.8 oz), SpO2 95 %. Vas [place] Lower Extremity Venous (dvt)  Result Date: 08/15/2017  Lower Venous Study Indication: Edema. Risk Factors: Surgery aortic aneurysm repair. Examination Guidelines: A complete evaluation includes B-mode imaging, spectral doppler, color doppler, and power doppler as needed of all accessible portions of each vessel. Bilateral testing is considered an integral part of a complete examination. Limited examinations for reoccurring indications may be performed as noted. The reflux portion of the exam is performed with the patient in reverse Trendelenburg.  Right Venous Findings: +---------+---------------+---------+-----------+----------+-----------------+          CompressibilityPhasicitySpontaneityPropertiesSummary           +---------+---------------+---------+-----------+----------+-----------------+ CFV      Full           Yes      Yes                                    +---------+---------------+---------+-----------+----------+-----------------+ FV Prox  Full           Yes      Yes                                    +---------+---------------+---------+-----------+----------+-----------------+ FV Mid   Full           Yes      Yes                                    +---------+---------------+---------+-----------+----------+-----------------+ FV DistalFull           Yes      Yes                                    +---------+---------------+---------+-----------+----------+-----------------+ PFV      Full           Yes      Yes                                     +---------+---------------+---------+-----------+----------+-----------------+ POP      Full           Yes      Yes                                    +---------+---------------+---------+-----------+----------+-----------------+ PTV      Full                                                           +---------+---------------+---------+-----------+----------+-----------------+ PERO  Full                                                           +---------+---------------+---------+-----------+----------+-----------------+ GSV      Partial                                      Age Indeterminate +---------+---------------+---------+-----------+----------+-----------------+  Left Venous Findings: +---------+---------------+---------+-----------+----------+-------+          CompressibilityPhasicitySpontaneityPropertiesSummary +---------+---------------+---------+-----------+----------+-------+ CFV      Full           Yes      Yes                          +---------+---------------+---------+-----------+----------+-------+ FV Prox  Full           Yes      Yes                          +---------+---------------+---------+-----------+----------+-------+ FV Mid   Full           Yes      Yes                          +---------+---------------+---------+-----------+----------+-------+ FV DistalFull           Yes      Yes                          +---------+---------------+---------+-----------+----------+-------+ PFV      Full           Yes      Yes                          +---------+---------------+---------+-----------+----------+-------+ POP      Full           Yes      Yes                          +---------+---------------+---------+-----------+----------+-------+ PTV      Full                                                 +---------+---------------+---------+-----------+----------+-------+ PERO     Full                                                  +---------+---------------+---------+-----------+----------+-------+ GSV      Full                                                 +---------+---------------+---------+-----------+----------+-------+    Final Interpretation: Right: There is no evidence of deep vein thrombosis in the lower extremity. Positive  for an age interminate superficial thrombosis of the greater saphenous vein just distal to the confluence with the saphenofemoral juction. There is no evidence of a Baker's cyst. Left: There is no evidence of deep vein thrombosis in the lower extremity.There is no evidence of superficial venous thrombosis. There is no evidence of a Baker's cyst.  *See table(s) above for measurements and observations. Electronically signed by Deitra Mayo on 08/15/2017 at 4:50:03 PM.    Recent Labs    08/14/17 0631  WBC 6.8  HGB 11.9*  HCT 38.6  PLT 209   Recent Labs    08/14/17 0631 08/15/17 0518  NA 141 139  K 3.7 3.7  CL 97* 94*  GLUCOSE 129* 96  BUN 33* 33*  CREATININE 0.61 0.68  CALCIUM 7.9* 8.1*   CBG (last 3)  Recent Labs    08/15/17 2346 08/16/17 0342 08/16/17 0757  GLUCAP 81 72 86    Wt Readings from Last 3 Encounters:  08/16/17 74.3 kg (163 lb 12.8 oz)  08/12/17 73.8 kg (162 lb 11.2 oz)  07/06/17 76.5 kg (168 lb 11.2 oz)    Physical Exam:  HENT:  Head: Normocephalic.  Nasogastric tube in place.   Eyes:  Pupils round and reactive to light  Neck:  Trach tube in place. #4 with PMV--in place with some surrounding tan secretions   Cardiac: RRR without murmur. No JVD     Respiratory:occ rhonchi. No distress this morning   GI: Soft. Bowel sounds are normal. She exhibits no distension. There is no tenderness. There is no rebound.  Musculoskeletal: She exhibits no tenderness or deformity.  Neurological:  Sitting up in chair. Makes good eye contact with examiner. Followed full commands. Oriented to name, hospital.  UE 4- to 4/5 prox to  distal. LE:   2+ HF, 3- KE and 3+ ADF/PF. No focal sensory deficits --motor and sensory exam stable Psychiatric: sl anxious     Assessment/Plan: 1.  Functional deficits secondary to debility which require 3+ hours per day of interdisciplinary therapy in a comprehensive inpatient rehab setting. Physiatrist is providing close team supervision and 24 hour management of active medical problems listed below. Physiatrist and rehab team continue to assess barriers to discharge/monitor patient progress toward functional and medical goals.  Function:  Bathing Bathing position Bathing activity did not occur: Refused(Pt refused bathing on eval stating nursing assisted prior to tx session)    Bathing parts      Bathing assist        Upper Body Dressing/Undressing Upper body dressing   What is the patient wearing?: Button up shirt         Button up shirt - Perfomed by patient: Thread/unthread right sleeve, Thread/unthread left sleeve Button up shirt - Perfomed by helper: Pull shirt around back, Button/unbutton shirt    Upper body assist Assist Level: Touching or steadying assistance(Pt > 75%)      Lower Body Dressing/Undressing Lower body dressing   What is the patient wearing?: Pants, Shoes       Pants- Performed by helper: Thread/unthread right pants leg, Thread/unthread left pants leg, Pull pants up/down Non-skid slipper socks- Performed by patient: Don/doff left sock Non-skid slipper socks- Performed by helper: Don/doff right sock   Socks - Performed by helper: Don/doff right sock, Don/doff left sock   Shoes - Performed by helper: Don/doff right shoe, Don/doff left shoe, Fasten right, Fasten left          Lower body assist  Toileting Toileting     Toileting steps completed by helper: Adjust clothing prior to toileting, Performs perineal hygiene, Adjust clothing after toileting    Toileting assist Assist level: Two helpers   Transfers Chair/bed transfer    Chair/bed transfer method: Stand pivot Chair/bed transfer assist level: Moderate assist (Pt 50 - 74%/lift or lower) Chair/bed transfer assistive device: Medical sales representative     Max distance: 20 ft Assist level: Touching or steadying assistance (Pt > 75%)   Wheelchair Wheelchair activity did not occur: Safety/medical concerns Type: Manual      Cognition Comprehension Comprehension assist level: Understands complex 90% of the time/cues 10% of the time  Expression Expression assist level: Expresses basic 90% of the time/requires cueing < 10% of the time.  Social Interaction Social Interaction assist level: Interacts appropriately 90% of the time - Needs monitoring or encouragement for participation or interaction.  Problem Solving Problem solving assist level: Solves basic 75 - 89% of the time/requires cueing 10 - 24% of the time  Memory Memory assist level: Recognizes or recalls 50 - 74% of the time/requires cueing 25 - 49% of the time  Medical Problem List and Plan:  1. Debility secondary to debility related to abdominal aneurysm/AS with AVR 07/10/2017 status post repair and subsequent cardiogenic shock  -Continue therapies PT, OT, speech -continue therapies 2. DVT Prophylaxis/Anticoagulation: Subcutaneous Lovenox. Check vascular study  3. Pain Management: Tylenol as needed  4. Mood: Klonopin 1 mg daily at bedtime, Seroquel 75 mg bedtime, Effexor 37.5 mg twice a day    -Klonopin as needed during the day for anxiety  5. Neuropsych: This patient is capable of making decisions on her own behalf.  6. Skin/Wound Care: Routine skin checks  7. Fluids/Electrolytes/Nutrition: We will discontinue scheduled continuous tube feeds and provide free water through the tube as well as protein supplements only to help stimulate appetite.  Currently only on D1 and pudding liquids   -   BUN slightly elevated but stable.  Continue current free water flushes 8. Tracheostomy 07/25/2017.  Downsized to #4 cuffless 08/08/2017. No current plan to decannulate due to failure with capping. Follow-up per Dr. Nelda Marseille.    -Continue nebulizers, trach care and suction   -Guaifenesin to help with secretions   -continue with PMV until secretions a little better  9. Atrial fibrillation. Amiodarone 200 mg daily. Cardiac rate controlled  10. Dysphagia. Dysphagia #1 pudding liquids. See above 11. Acute diastolic congestive heart failure. Lasix 40 mg daily, Aldactone 25 mg daily. Monitor for any signs of fluid overload    -continue current diuretics.  BUN stable today Filed Weights   08/14/17 0010 08/15/17 0004 08/16/17 0005  Weight: 74.1 kg (163 lb 6.6 oz) 74.4 kg (164 lb 0.4 oz) 74.3 kg (163 lb 12.8 oz)   12. Hypothyroidism. Synthroid  13. Hyperglycemia secondary to tube feeds. --these have been stopped     LOS (Days) 4 A FACE TO FACE EVALUATION WAS PERFORMED  Meredith Staggers, MD 08/16/2017 9:18 AM

## 2017-08-16 NOTE — Progress Notes (Signed)
Occupational Therapy Session Note  Patient Details  Name: [name] MRN: [id number] Date of Birth: [birth year]  Today's Date: 08/16/2017 OT Individual Time: 7616-0737 OT Individual Time Calculation (min): 45 min    Short Term Goals: Week 1:  OT Short Term Goal 1 (Week 1): Pt will recall and utilize sternal pre-cautions during functional tasks with mod A OT Short Term Goal 2 (Week 1): Pt will complete stand pivot transfer to BSC/ toilet with mod A OT Short Term Goal 3 (Week 1): Pt will complete 1/3 toileting tasks with mod steadying assist in order to decrease caregiver burden OT Short Term Goal 4 (Week 1): Pt will don shirt with min A      Skilled Therapeutic Interventions/Progress Updates:    Pt seen this session for ADL training with a focus on following sternal precautions, sit to stand, and standing balance.  Pt worked on rolling onto side and then sidelying to sit with min A.  She did very well with sit to stand, keeping hands on chest and forward leaning, but once she rises to standing has a significant posterior lean.  Attempted standing 4x with support from RW but each time pt needs mod A to  Avoid falling back.  Tried multiple cues/ facilitation methods to adjust her posture but pt kept leaning.  Pt is fearful of falling. She worked on bathing and dressing from [employer] bathing, max LB dressing.   Pt also demonstrated some confusion in discussing things about past events.  Pt's spouse arrived. Pt adjusted back in bed to rest with alarm on and all needs met.   Therapy Documentation Precautions:  Precautions Precautions: Fall, Sternal Precaution Comments: Reinforced sternal precautions Restrictions Weight Bearing Restrictions: No Other Position/Activity Restrictions: sternal precautions    Vital Signs: Therapy Vitals Pulse Rate: 84 Resp: 18 Patient Position (if appropriate): Lying Oxygen Therapy SpO2: 95 % O2 Device: Not Delivered O2 Flow Rate (L/min): 5 L/min FiO2  (%): 21 % Pain: Pain Assessment Pain Assessment: No/denies pain   ADL:   See Function Navigator for Current Functional Status.   Therapy/Group: Individual Therapy  [place] 08/16/2017, 9:47 AM

## 2017-08-16 NOTE — Progress Notes (Signed)
Speech Language Pathology Daily Session Note  Patient Details  Name: Terri FREIBERGER MRN: [id number] Date of Birth: [birth year]  Today's Date: 08/16/2017 SLP Individual Time: 1400-1500 SLP Individual Time Calculation (min): 60 min  Short Term Goals: Week 1: SLP Short Term Goal 1 (Week 1): Pt will donn and doff PMSV with Max A cues.  SLP Short Term Goal 2 (Week 1): Pt will utilize external memory aid to recall basic information with Max A cues (such as swallowing/diet recommendation information).  SLP Short Term Goal 3 (Week 1): Pt will demonstrate basic problem solving with familiar tasks and Mod A cues.  SLP Short Term Goal 4 (Week 1): Pt will demonstrate sustained attention for ~ 30 minutes with supervision cues.  SLP Short Term Goal 5 (Week 1): Pt will consume ice chips without overt s/s of aspiration to demonstrate readiness for repeat swallow study.   Skilled Therapeutic Interventions:   Skilled treatment #1 focused on completion of MBS. See chart for full report.   Skilled treatment #2 focused on completion of education with pt and nursing on POC for trials during afternoon and results of MBS. Nursing to coordinate plan to pull Cortrac prior to trials of PO intake this afternoon.   Skilled treatment #3 focused on dysphagia and cognition goals. SLP facilitated session by providing skilled observation of pt consuming dysphagia 3 and thin liquids via cup. Pt with good oral ability and no evidence of fatigue with advanced textures. With mirror in place, pt able to locate trach, Mod A cues to understand purpose of trach and Min A cues to donn PMSV (SLP removed PMSV). Pt is unable to remove. Unable to fully demonstrate understanding of PMSV. SLP further facilitated session by providing Min A cues to demonstrate completion of basic problem solving and demonstrate selective attention in moderately distracting environment (also maintain current topic) for ~ 45 minutes with supervision only cues. Pt  with great progress and recommend diet upgrade to dysphagia 3 with thin liquids, full supervision, PMSV in place and medicine whole with puree. Of note, pt with audible exhalations when PMSV which is concerning for possible decreased upper airway patency.      Function:  Eating Eating   Modified Consistency Diet: Yes Eating Assist Level: More than reasonable amount of time   Eating Set Up Assist For: Opening containers;Cutting food;Applying device (includes dentures)       Cognition Comprehension Comprehension assist level: Understands complex 90% of the time/cues 10% of the time  Expression Expression assistive device: Talk trach valve Expression assist level: Expresses basic 75 - 89% of the time/requires cueing 10 - 24% of the time. Needs helper to occlude trach/needs to repeat words.  Social Interaction Social Interaction assist level: Interacts appropriately 90% of the time - Needs monitoring or encouragement for participation or interaction.  Problem Solving Problem solving assist level: Solves basic 75 - 89% of the time/requires cueing 10 - 24% of the time  Memory Memory assist level: Recognizes or recalls 50 - 74% of the time/requires cueing 25 - 49% of the time    Pain    Therapy/Group: Individual Therapy  Dimple Bastyr 08/16/2017, 3:24 PM

## 2017-08-16 NOTE — Progress Notes (Signed)
Physical Therapy Session Note  Patient Details  Name: [name] MRN: [id number] Date of Birth: 23-Jan-1941  Today's Date: 08/16/2017 PT Individual Time: 1300-1400 PT Individual Time Calculation (min): 60 min  Short Term Goals: Week 1:  PT Short Term Goal 1 (Week 1): Pt will perform <>stand with mod assist while maintaining sternal precautions PT Short Term Goal 2 (Week 1): Pt will perform stand pivot transfer with min assist and use of RW PT Short Term Goal 3 (Week 1): Pt will ambualted x 50 ft with RW and mod assist  Skilled Therapeutic Interventions/Progress Updates:    Pt was greeted supine in bed, states she had just gotten  In bed and was tired however husband reports she was in bed for 2 hours, agrees to therapy with encouragement. Pt shoes donned in supine, NG tube and supplemental oxygen were discharged earlier today. Pt transitions supine to sit with modA for trunk and scooting hips, vcing required for pt to maintain precuations. Pt transfers bed>WC using RW with modA for boosting, bed elevated. Pt ambulates 31ft x 2 using RW with modA for sit<>stand, min guard for balance and safety during gait. Pt demonstrates B decreased step length, increased during 2nd gait trial with vcing. Pt transported to therapy steps, performs sit<>stand with BUE support on handrails, modA for boosting. Performs step up onto first step with RLE leading, modA for balance. Attempted unilateral heel cord step with R heel off step however pt unable to tolerate position d/t reports of SOB. SatO2 maintained 95-99% throughout session. Pt performs standing heel cord stretch with B forefoot on wedge with increased toelrance, however pt unable to maintain standing balance, therapist provided B heel cord PROM stretch. Pt performs stand pivot transfer WC<> nustep using RW with modA for boosting, completes 5 mins on nustep level 1 using BLE only, reports difficulty with pushing with  LLE. At end of session, pt left sitting  upright in manual WC, handed off to speech therapy in dayroom, denies any needs at this time.    Therapy Documentation Precautions:  Precautions Precautions: Fall, Sternal Precaution Comments: Reinforced sternal precautions Restrictions Weight Bearing Restrictions: No Other Position/Activity Restrictions: sternal precautions   See Function Navigator for Current Functional Status.   Therapy/Group: Individual Therapy  Salvatore Decent 08/16/2017, 3:45 PM

## 2017-08-16 NOTE — Progress Notes (Signed)
Physical Therapy Note  Patient Details  Name: Terri GUTRIDGE MRN: [id number] Date of Birth: October 15, 1940 Today's Date: 08/16/2017  [id number], 13 min individual tx Pain: none per pt  Make-up session  Pt made a comment that indicated she is unsure of her location at the hospital vs at home, but she was able to relate details of her surgery and time line well.  Pt seen bedside for therapeutic exercise performed with LE to increase strength for functional mobility: supine with HOB raised: 10 x 1 alternating ankle pumps, long arc quad knee ext, bil hip adduction.  Gentle PROM bil ankles and hips.  Transporter arrived to take pt for MBS at 9:00  Pt left in care of transporter.  See function navigator for current status.   Yurianna Tusing 08/16/2017, 8:48 AM

## 2017-08-16 NOTE — Progress Notes (Signed)
Modified Barium Swallow Progress Note  Patient Details  Name: MANPREET KEMMER MRN: [id number] Date of Birth: 1940/11/09  Today's Date: 08/16/2017  Modified Barium Swallow completed.  Full report located under Chart Review in the Imaging Section.  Brief recommendations include the following:  Clinical Impression  Pt with improved oropharyngeal function. Pt with mild pharyngeal sensory impairment which led to delayed swallow initation as bolus transited from vallecula to pyriform sinuses. Pt with no aspiration but consistent penetration across all consistencies that varied from flash penetration to deeper penetration (never reaching depth of vocal cords). All penetration was cleared during the swallow. However, chin tuck with nectar thick liquids and thin liquids eliminated penetration thereby reducing risk of aspiration. Swallow function was not changed when consuming without PMSV in place. Cortrac impedes full epiglottic deflection and expect that once Cortrac is removed pt will exhibit better airway protection. Multiple consecutive sips of thin liquids via cup resulted with increased residue in pyriform sinuses that pt cleared with independent subsequent swallows. Would recommend dysphagia 2 with thin liquids and chin tuck with Cortrac in place and clinically don't feel that chin tuck will be needed when Cortrac is removed.    Swallow Evaluation Recommendations       SLP Diet Recommendations: Dysphagia 2 (Fine chop) solids;Thin liquid   Liquid Administration via: Cup   Medication Administration: Whole meds with puree   Supervision: Patient able to self feed;Full supervision/cueing for compensatory strategies   Compensations: Minimize environmental distractions;Slow rate;Small sips/bites   Postural Changes: Seated upright at 90 degrees   Oral Care Recommendations: Oral care BID   Other Recommendations: Place PMSV during PO intake    Demetris Meinhardt 08/16/2017,12:13 PM

## 2017-08-17 ENCOUNTER — Inpatient Hospital Stay (HOSPITAL_COMMUNITY): Payer: Medicare Other | Admitting: Speech Pathology

## 2017-08-17 ENCOUNTER — Inpatient Hospital Stay (HOSPITAL_COMMUNITY): Payer: Medicare Other | Admitting: Occupational Therapy

## 2017-08-17 ENCOUNTER — Inpatient Hospital Stay (HOSPITAL_COMMUNITY): Payer: Medicare Other | Admitting: Physical Therapy

## 2017-08-17 MED ORDER — ENSURE ENLIVE PO LIQD
237.0000 mL | Freq: Two times a day (BID) | ORAL | Status: DC
  Administered 2017-08-17 – 2017-08-28 (×8): 237 mL via ORAL

## 2017-08-17 MED ORDER — SENNA 8.6 MG PO TABS
1.0000 | ORAL_TABLET | Freq: Two times a day (BID) | ORAL | Status: DC
  Administered 2017-08-17 – 2017-08-28 (×21): 8.6 mg via ORAL
  Filled 2017-08-17 (×22): qty 1

## 2017-08-17 MED ORDER — ADULT MULTIVITAMIN W/MINERALS CH
1.0000 | ORAL_TABLET | Freq: Every day | ORAL | Status: DC
  Administered 2017-08-17 – 2017-09-02 (×17): 1 via ORAL
  Filled 2017-08-17 (×17): qty 1

## 2017-08-17 NOTE — Progress Notes (Signed)
Speech Language Pathology Daily Session Note  Patient Details  Name: DWANNA GOSHERT MRN: [id number] Date of Birth: 1940-12-30  Today's Date: 08/17/2017 SLP Individual Time: 0800-0830 SLP Individual Time Calculation (min): 30 min  Short Term Goals: Week 1: SLP Short Term Goal 1 (Week 1): Pt will donn and doff PMSV with Max A cues.  SLP Short Term Goal 2 (Week 1): Pt will utilize external memory aid to recall basic information with Max A cues (such as swallowing/diet recommendation information).  SLP Short Term Goal 3 (Week 1): Pt will demonstrate basic problem solving with familiar tasks and Mod A cues.  SLP Short Term Goal 4 (Week 1): Pt will demonstrate sustained attention for ~ 30 minutes with supervision cues.  SLP Short Term Goal 5 (Week 1): Pt will consume dysphagia 3 diet with thin liquids without overt s/s of aspiration with supervision cues for use of compensatory swallow strategies.  SLP Short Term Goal 5 - Progress (Week 1): Updated due to goal met  Skilled Therapeutic Interventions: Skilled treatment session focused on dysphagia and cognition goals. SLP received pt following RT services. RT gave pt thin liquids without placing PMSV. However, per MBS pt with safe consumption of thin liquids without PMSV in place. SLP facilitated session by providing skilled observation of pt consuming dysphagia 3 breakfast tray with thin liquids via cup. Pt unable to donn PMSV, SLP placed for her. Pt consumed ~ 25% of breakfast and didn't present with any overt s/s of aspiration. All vitals remained within functional limits. SLP further facilitated session by providing supervision cues for selective attention in mildly distracting environment for ~ 30 minutes. Pt was left upright in bed with all needs within reach. Continue per current plan of care.      Function:  Eating Eating   Modified Consistency Diet: Yes             Cognition Comprehension Comprehension assist level: Follows basic  conversation/direction with no assist  Expression Expression assistive device: Talk trach valve Expression assist level: Expresses basic 75 - 89% of the time/requires cueing 10 - 24% of the time. Needs helper to occlude trach/needs to repeat words.  Social Interaction Social Interaction assist level: Interacts appropriately 90% of the time - Needs monitoring or encouragement for participation or interaction.  Problem Solving Problem solving assist level: Solves basic 75 - 89% of the time/requires cueing 10 - 24% of the time  Memory Memory assist level: Recognizes or recalls 50 - 74% of the time/requires cueing 25 - 49% of the time    Pain    Therapy/Group: Individual Therapy  Leala Bryand 08/17/2017, 9:24 AM

## 2017-08-17 NOTE — Progress Notes (Signed)
Occupational Therapy Session Note  Patient Details  Name: [name] MRN: [id number] Date of Birth: 1940-12-23  Today's Date: 08/17/2017 OT Individual Time: 7517-0017 OT Individual Time Calculation (min): 17 min    Short Term Goals: Week 1:  OT Short Term Goal 1 (Week 1): Pt will recall and utilize sternal pre-cautions during functional tasks with mod A OT Short Term Goal 2 (Week 1): Pt will complete stand pivot transfer to BSC/ toilet with mod A OT Short Term Goal 3 (Week 1): Pt will complete 1/3 toileting tasks with mod steadying assist in order to decrease caregiver burden OT Short Term Goal 4 (Week 1): Pt will don shirt with min A Week 2:     Skilled Therapeutic Interventions/Progress Updates:    1:1 Focus on bed mobility to EOB with min A with extra time. Pt performed sit to stand with mod A with multimodal cues to weight shift forward. Min  A stand pivot over to the w/c. Pt transferred to the toilet again with min to mod A and ambulated from toilet to shower with min A HHA. Pt showered with mod A overall with trach covered with PMSV donned. Redressed in hospital gown. By the end of session pt able to perform si tto stand with increased forward lean and min A to maintain standing for donning brief. Returned to bed with mod A (A for both LEs.).   Therapy Documentation Precautions:  Precautions Precautions: Fall, Sternal Precaution Comments: Reinforced sternal precautions Restrictions Weight Bearing Restrictions: Yes(sternal precautions ) Other Position/Activity Restrictions: sternal precautions General:   Vital Signs: Therapy Vitals Temp: 98.1 F (36.7 C) Temp Source: Oral Pulse Rate: 89 Resp: 18 BP: 118/62 Patient Position (if appropriate): Lying Oxygen Therapy SpO2: 94 % O2 Device: Tracheostomy Collar;Not Delivered O2 Flow Rate (L/min): 5 L/min FiO2 (%): 21 % Pain:    See Function Navigator for Current Functional Status.   Therapy/Group: Individual  Therapy  Willeen Cass [facility] 08/17/2017, 3:31 PM

## 2017-08-17 NOTE — Progress Notes (Signed)
[place] PHYSICAL MEDICINE & REHABILITATION     PROGRESS NOTE    Subjective/Complaints: Slept well. Feels that breathingand secretions better last night  ROS: pt denies nausea, vomiting, diarrhea, cough, shortness of breath or chest pain   Objective: Vital Signs:  Blood pressure (!) 110/59, pulse 90, temperature 98.2 F (36.8 C), temperature source Oral, resp. rate 18, height 5\' 5"  (1.651 m), weight 74.1 kg (163 lb 5.8 oz), SpO2 93 %. Vas [place] Lower Extremity Venous (dvt)  Result Date: 08/15/2017  Lower Venous Study Indication: Edema. Risk Factors: Surgery aortic aneurysm repair. Examination Guidelines: A complete evaluation includes B-mode imaging, spectral doppler, color doppler, and power doppler as needed of all accessible portions of each vessel. Bilateral testing is considered an integral part of a complete examination. Limited examinations for reoccurring indications may be performed as noted. The reflux portion of the exam is performed with the patient in reverse Trendelenburg.  Right Venous Findings: +---------+---------------+---------+-----------+----------+-----------------+          CompressibilityPhasicitySpontaneityPropertiesSummary           +---------+---------------+---------+-----------+----------+-----------------+ CFV      Full           Yes      Yes                                    +---------+---------------+---------+-----------+----------+-----------------+ FV Prox  Full           Yes      Yes                                    +---------+---------------+---------+-----------+----------+-----------------+ FV Mid   Full           Yes      Yes                                    +---------+---------------+---------+-----------+----------+-----------------+ FV DistalFull           Yes      Yes                                    +---------+---------------+---------+-----------+----------+-----------------+ PFV      Full           Yes       Yes                                    +---------+---------------+---------+-----------+----------+-----------------+ POP      Full           Yes      Yes                                    +---------+---------------+---------+-----------+----------+-----------------+ PTV      Full                                                           +---------+---------------+---------+-----------+----------+-----------------+  PERO     Full                                                           +---------+---------------+---------+-----------+----------+-----------------+ GSV      Partial                                      Age Indeterminate +---------+---------------+---------+-----------+----------+-----------------+  Left Venous Findings: +---------+---------------+---------+-----------+----------+-------+          CompressibilityPhasicitySpontaneityPropertiesSummary +---------+---------------+---------+-----------+----------+-------+ CFV      Full           Yes      Yes                          +---------+---------------+---------+-----------+----------+-------+ FV Prox  Full           Yes      Yes                          +---------+---------------+---------+-----------+----------+-------+ FV Mid   Full           Yes      Yes                          +---------+---------------+---------+-----------+----------+-------+ FV DistalFull           Yes      Yes                          +---------+---------------+---------+-----------+----------+-------+ PFV      Full           Yes      Yes                          +---------+---------------+---------+-----------+----------+-------+ POP      Full           Yes      Yes                          +---------+---------------+---------+-----------+----------+-------+ PTV      Full                                                 +---------+---------------+---------+-----------+----------+-------+  PERO     Full                                                 +---------+---------------+---------+-----------+----------+-------+ GSV      Full                                                 +---------+---------------+---------+-----------+----------+-------+    Final Interpretation: Right: There is no evidence of deep vein thrombosis  in the lower extremity. Positive for an age interminate superficial thrombosis of the greater saphenous vein just distal to the confluence with the saphenofemoral juction. There is no evidence of a Baker's cyst. Left: There is no evidence of deep vein thrombosis in the lower extremity.There is no evidence of superficial venous thrombosis. There is no evidence of a Baker's cyst.  *See table(s) above for measurements and observations. Electronically signed by Deitra Mayo on 08/15/2017 at 4:50:03 PM.    No results for input(s): WBC, HGB, HCT, PLT in the last 72 hours. Recent Labs    08/15/17 0518  NA 139  K 3.7  CL 94*  GLUCOSE 96  BUN 33*  CREATININE 0.68  CALCIUM 8.1*   CBG (last 3)  Recent Labs    08/15/17 2346 08/16/17 0342 08/16/17 0757  GLUCAP 81 72 86    Wt Readings from Last 3 Encounters:  08/17/17 74.1 kg (163 lb 5.8 oz)  08/12/17 73.8 kg (162 lb 11.2 oz)  07/06/17 76.5 kg (168 lb 11.2 oz)    Physical Exam:  HENT:  Head: Normocephalic.  Nasogastric tube in place.   Eyes:  Pupils round and reactive to light  Neck:  Trach tube in place. #4 trac, no secretions   Cardiac: RRR without murmur. No JVD   Respiratory: normal effort. No rhonchi   GI: Soft. Bowel sounds are normal. She exhibits no distension. There is no tenderness. There is no rebound.  Musculoskeletal: She exhibits no tenderness or deformity.  Neurological:  Sitting up in chair. Makes good eye contact with examiner. Followed full commands. Oriented to name, hospital.  UE 4- to 4/5 prox to distal. LE:   2+ HF, 3- KE and 3+ ADF/PF. No focal sensory deficits  --stable Psychiatric: sl anxious     Assessment/Plan: 1.  Functional deficits secondary to debility which require 3+ hours per day of interdisciplinary therapy in a comprehensive inpatient rehab setting. Physiatrist is providing close team supervision and 24 hour management of active medical problems listed below. Physiatrist and rehab team continue to assess barriers to discharge/monitor patient progress toward functional and medical goals.  Function:  Bathing Bathing position Bathing activity did not occur: (per nursing only peri care done, patient refuses bathing )    Bathing parts      Bathing assist        Upper Body Dressing/Undressing Upper body dressing   What is the patient wearing?: Button up shirt         Button up shirt - Perfomed by patient: Thread/unthread right sleeve, Thread/unthread left sleeve Button up shirt - Perfomed by helper: Pull shirt around back, Button/unbutton shirt    Upper body assist Assist Level: Touching or steadying assistance(Pt > 75%)      Lower Body Dressing/Undressing Lower body dressing   What is the patient wearing?: Pants, Shoes       Pants- Performed by helper: Thread/unthread right pants leg, Thread/unthread left pants leg, Pull pants up/down Non-skid slipper socks- Performed by patient: Don/doff left sock Non-skid slipper socks- Performed by helper: Don/doff right sock   Socks - Performed by helper: Don/doff right sock, Don/doff left sock   Shoes - Performed by helper: Don/doff right shoe, Don/doff left shoe, Fasten right, Fasten left          Lower body assist        Toileting Toileting   Toileting steps completed by patient: Performs perineal hygiene Toileting steps completed by helper: Adjust clothing prior to toileting, Adjust clothing  after toileting    Toileting assist Assist level: Touching or steadying assistance (Pt.75%)   Transfers Chair/bed transfer   Chair/bed transfer method: Stand  pivot Chair/bed transfer assist level: Moderate assist (Pt 50 - 74%/lift or lower) Chair/bed transfer assistive device: Medical sales representative     Max distance: 20 ft Assist level: Touching or steadying assistance (Pt > 75%)   Wheelchair Wheelchair activity did not occur: Safety/medical concerns Type: Manual   Assist Level: Dependent (Pt equals 0%)  Cognition Comprehension Comprehension assist level: Understands complex 90% of the time/cues 10% of the time  Expression Expression assist level: Expresses basic 75 - 89% of the time/requires cueing 10 - 24% of the time. Needs helper to occlude trach/needs to repeat words.  Social Interaction Social Interaction assist level: Interacts appropriately 90% of the time - Needs monitoring or encouragement for participation or interaction.  Problem Solving Problem solving assist level: Solves basic 75 - 89% of the time/requires cueing 10 - 24% of the time  Memory Memory assist level: Recognizes or recalls 50 - 74% of the time/requires cueing 25 - 49% of the time  Medical Problem List and Plan:  1. Debility secondary to debility related to abdominal aneurysm/AS with AVR 07/10/2017 status post repair and subsequent cardiogenic shock  -Continue therapies PT, OT, speech 2. DVT Prophylaxis/Anticoagulation: Subcutaneous Lovenox. Check vascular study  3. Pain Management: Tylenol as needed  4. Mood: Klonopin 1 mg daily at bedtime, Seroquel 75 mg bedtime, Effexor 37.5 mg twice a day    -Klonopin as needed during the day for anxiety  5. Neuropsych: This patient is capable of making decisions on her own behalf.  6. Skin/Wound Care: Routine skin checks  7. Fluids/Electrolytes/Nutrition: We will discontinue scheduled continuous tube feeds and provide free water through the tube as well as protein supplements only to help stimulate appetite.  Currently only on D1 and pudding liquids   -   BUN slightly elevated but stable.  Continue current free water  flushes 8. Tracheostomy 07/25/2017. Downsized to #4 cuffless 08/08/2017. No current plan to decannulate due to failure with capping. Follow-up per Dr. Nelda Marseille.    -Continue nebulizers, trach care and suction   -Guaifenesin to help with secretions   -continue with PMV, will begin day time capping trials to see how she does.  She is managing secretions much better than when she was on acute.  9. Atrial fibrillation. Amiodarone 200 mg daily. Cardiac rate controlled  10. Dysphagia. Dysphagia #1 pudding liquids. See above 11. Acute diastolic congestive heart failure. Lasix 40 mg daily, Aldactone 25 mg daily. Monitor for any signs of fluid overload    -continue current diuretics.  BUN stable ---recheck tomorrow Filed Weights   08/15/17 0004 08/16/17 0005 08/17/17 0329  Weight: 74.4 kg (164 lb 0.4 oz) 74.3 kg (163 lb 12.8 oz) 74.1 kg (163 lb 5.8 oz)   12. Hypothyroidism. Synthroid  13. Hyperglycemia secondary to tube feeds. --these have been stopped     LOS (Days) 5 A FACE TO FACE EVALUATION WAS PERFORMED  Meredith Staggers, MD 08/17/2017 8:08 AM

## 2017-08-17 NOTE — Progress Notes (Signed)
Occupational Therapy Session Note  Patient Details  Name: [name] MRN: [id number] Date of Birth: [birth year]  Today's Date: 08/17/2017 OT Individual Time: 2863-8177 OT Individual Time Calculation (min): 17 min    Short Term Goals: Week 1:  OT Short Term Goal 1 (Week 1): Pt will recall and utilize sternal pre-cautions during functional tasks with mod A OT Short Term Goal 2 (Week 1): Pt will complete stand pivot transfer to BSC/ toilet with mod A OT Short Term Goal 3 (Week 1): Pt will complete 1/3 toileting tasks with mod steadying assist in order to decrease caregiver burden OT Short Term Goal 4 (Week 1): Pt will don shirt with min A  Skilled Therapeutic Interventions/Progress Updates:    Pt attempting to refuse therapy session, reporting fatigue from therapies this AM.  Therapist provided encouragement to participate with pt discussing all her ailments and progress from admission. Discussed need for continued therapy to continue towards goals and prepare for d/c.  Continued to refuse OOB activity.  Educated on energy conservation strategies for general activity as well as self-care tasks with pt verbalizing understanding.    Therapy Documentation Precautions:  Precautions Precautions: Fall, Sternal Precaution Comments: Reinforced sternal precautions Restrictions Weight Bearing Restrictions: Yes(sternal precautions ) Other Position/Activity Restrictions: sternal precautions General:   Vital Signs: Therapy Vitals Temp: 98.1 F (36.7 C) Temp Source: Oral Pulse Rate: 89 Resp: 18 BP: 118/62 Patient Position (if appropriate): Lying Oxygen Therapy SpO2: 94 % O2 Device: Tracheostomy Collar;Not Delivered O2 Flow Rate (L/min): 5 L/min FiO2 (%): 21 % Pain:   Pt with no c/o pain  See Function Navigator for Current Functional Status.   Therapy/Group: Individual Therapy  Simonne Come 08/17/2017, 3:30 PM

## 2017-08-17 NOTE — Progress Notes (Signed)
Pt. Has orders to cork trach today for a trial during day hours only. Attempted to cork trach at 1200. Patient commented that it felt tight, this RN did explain that this feeling is normal. Student RN proceeded to give pt. Her 1200 medications, with included 68mL Pro-Stat and 16mL Robitussin DM. Pt. Did not tolerate the medication pass and the cork trach at the same time. Pt. Gagged and medications were vomited back up. Cork from the trach was removed and the #4 Shiley was put back in, and pt. Remained calm throughout exchange. Will attempt corking trach at another time.

## 2017-08-17 NOTE — Progress Notes (Signed)
RT attempted to cap the patient's trach per MD order.  Pt states someone had already tried to cap it and it did not go well.  RT capped trach, pt states it feels tight but ok, sat's remained stable.  RT asked pt to cough. Pt was able to cough up some sputum from her mouth with the trach capped.  RN came to bedside and shared previous cap trial.  Since the pt's lunch had been delivered, RT removed cap, replaced inner cannula into the trach.  RT suggested we could attempt to cap the trach again between lunch and dinner to see if the patient can tolerate it for a longer period of time.

## 2017-08-17 NOTE — Progress Notes (Signed)
Nutrition Follow-up  DOCUMENTATION CODES:   Not applicable  INTERVENTION:   RD to D/C magic cup and Pro-stat per pt preferences.   RD to order Ensure Enlive (chocolate) BID, each supplement provides 350kcals and 20g of protein.  RD to order MVI.   NUTRITION DIAGNOSIS:   Inadequate oral intake related to poor appetite & pt dislike of dysphagia diet as evidenced by per patient/family report. Modified.  GOAL:   Patient will meet greater than or equal to 90% of their needs  Not met.  MONITOR:   PO intake, I & O's, Weight trends, Diet advancement, Skin, Supplement acceptance  ASSESSMENT:   [name] with PMH of HTN, HLD, and B12 deficiency. S/p planned repair of ascending aortic aneurysm and aortic valve replacement on 07/10/2017. S/p right ventricular assist device and hematoma exploration/removal on 07/11/2017. RVAD removed and sternum closed on 07/17/2017. Pt required vent support and subsequent tracheostomy on 07/25/2017. Tube feeds D/C 08/14/2017; pt on Dysphagia I, pudding thick liquids 08/12/2017 and advanced to Dysphagia III, thin liquids 08/16/2017.    NGT has been removed. Pt still expresses dislike of food despite diet advancement. Pt reports a poor appetite still and has been eating anywhere from bites of food to 50% of meals. Pt reports vomiting up dinner last night due to food getting caught in her throat.   Pt reports mild constipation and but does not wish to take senokot because it is not her regular home medication. Intern explained that constipation can suppress appetite. Per chart review, pt is having fairly regular bowel movements.   Pt dislikes magic cup and pro-stat but was amenable to drinking chocolate Ensure between meals to increase protein and calories. Intern explained the importance of increased protein and calorie needs during healing and rehabilitation. Intern encouraged food intake.   Wt has been stable since admission as outlined below.    Medications: Synthroid, aldactone, lasix.  Labs reviewed.   Diet Order:  DIET DYS 3 Room service appropriate? Yes; Fluid consistency: Thin  EDUCATION NEEDS:   Education needs have been addressed  Skin:  Skin Assessment: Skin Integrity Issues: Skin Integrity Issues:: Stage II, Incisions, Other (Comment) Stage II: Neck  Incisions: abdomen, groin, chest x2 Other: Laceration/skin tears lower legs  Last BM:  2/20 type 4 medium  Height:   Ht Readings from Last 1 Encounters:  08/12/17 5' 5"  (1.651 m)    Weight:   Filed Weights   08/15/17 0004 08/16/17 0005 08/17/17 0329  Weight: 164 lb 0.4 oz (74.4 kg) 163 lb 12.8 oz (74.3 kg) 163 lb 5.8 oz (74.1 kg)      Ideal Body Weight:  56.8 kg  BMI:  Body mass index is 27.18 kg/m.  Estimated Nutritional Needs:   Kcal:  1600-1800  Protein:  85-95g  Fluid:  1.6-1.8L    Terri Mckimmy, MS, Dietetic Intern Pager # [phone]

## 2017-08-17 NOTE — Progress Notes (Signed)
Physical Therapy Session Note  Patient Details  Name: [name] MRN: [id number] Date of Birth: 1940/09/01  Today's Date: 08/17/2017 PT Individual Time: 1030-1130 PT Individual Time Calculation (min): 60 min   Short Term Goals: Week 1:  PT Short Term Goal 1 (Week 1): Pt will perform <>stand with mod assist while maintaining sternal precautions PT Short Term Goal 2 (Week 1): Pt will perform stand pivot transfer with min assist and use of RW PT Short Term Goal 3 (Week 1): Pt will ambualted x 50 ft with RW and mod assist  Skilled Therapeutic Interventions/Progress Updates: Pt received supine in bed,denies pain and agreeable to treatment. Performed supine>sit modA with HOB elevated. Changed to clean gown with setupA. Stand pivot transfer min guard sit >stand from slightly elevated bed and RW. Gait x30' with minA and RW; instructed pt in alerting therapist when beginning to get fatigued to allow enough energy to make it back to the w/c. Occasional mild LOBs posteriorly during gait, min guard and min cueing to recover. Standing balance on foam wedge to facilitate gastroc/soleus ROM, closed chain dorsiflexion activation for ankle strategy d/t posterior bias observed in standing and shortened step length during gait. Standing toe taps with UE support on RW to 4" step for focus on weight shifting, LE coordination, standing tolerance and dynamic balance. Supine hip flexor stretch 2x1 min BLE for carryover into step length and gait mechanics. Returned to room totalA in w/c. Stand pivot to bed modA for boosting to stand with RW. Sit >supine modA for LE management. Remained supine in bed at end of session; PMSV removed per SLP orders. All needs in reach and four bedrails up per pt request.      Therapy Documentation Precautions:  Precautions Precautions: Fall, Sternal Precaution Comments: Reinforced sternal precautions Restrictions Weight Bearing Restrictions: (sternal precautions) Other  Position/Activity Restrictions: sternal precautions   See Function Navigator for Current Functional Status.   Therapy/Group: Individual Therapy  Luberta Mutter 08/17/2017, 11:47 AM

## 2017-08-17 NOTE — Progress Notes (Signed)
RT attempted to cap pt's trach again.  Pt states she can not get good breathes with the trach capped, sat dropped to 90% but would improve with a deep breath. The pt was able to cough up a small amount of sputum through her mouth.  When trach is capped you can hear audible expiratory wheeze sounds and the pt has increased WOB.  RT replaced inner cannula and PMV, and the audible wheezing stopped.  Pt's sat recovered to 95%, and pt states she can breath easier with the inner cannula.

## 2017-08-18 ENCOUNTER — Inpatient Hospital Stay (HOSPITAL_COMMUNITY): Payer: Medicare Other

## 2017-08-18 ENCOUNTER — Inpatient Hospital Stay (HOSPITAL_COMMUNITY): Payer: Medicare Other | Admitting: Physical Therapy

## 2017-08-18 ENCOUNTER — Inpatient Hospital Stay (HOSPITAL_COMMUNITY): Payer: Medicare Other | Admitting: Speech Pathology

## 2017-08-18 MED ORDER — MEGESTROL ACETATE 400 MG/10ML PO SUSP
400.0000 mg | Freq: Two times a day (BID) | ORAL | Status: DC
  Administered 2017-08-18 – 2017-09-02 (×31): 400 mg via ORAL
  Filled 2017-08-18 (×31): qty 10

## 2017-08-18 NOTE — Progress Notes (Signed)
Speech Language Pathology Daily Session Note  Patient Details  Name: [name] MRN: [id number] Date of Birth: [birth year]  Today's Date: 08/18/2017 SLP Individual Time: 0730-0830 SLP Individual Time Calculation (min): 60 min  Short Term Goals: Week 1: SLP Short Term Goal 1 (Week 1): Pt will donn and doff PMSV with Max A cues.  SLP Short Term Goal 2 (Week 1): Pt will utilize external memory aid to recall basic information with Max A cues (such as swallowing/diet recommendation information).  SLP Short Term Goal 3 (Week 1): Pt will demonstrate basic problem solving with familiar tasks and Mod A cues.  SLP Short Term Goal 4 (Week 1): Pt will demonstrate sustained attention for ~ 30 minutes with supervision cues.  SLP Short Term Goal 5 (Week 1): Pt will consume dysphagia 3 diet with thin liquids without overt s/s of aspiration with supervision cues for use of compensatory swallow strategies.  SLP Short Term Goal 5 - Progress (Week 1): Updated due to goal met  Skilled Therapeutic Interventions: Skilled treatment session focused on dysphagia and cognitive goals. SLP facilitated session by providing skilled observation of pt consuming breakfast of dys. 3 textures with thin liquids. Pt consumed dys. 3 textures within a mixed consistency with immediate cough x1, suspect due to possible premature spillage of thin liquids into the laryngeal vestibule. Recommend pt continue current diet with full supervision. SLP further facilitated session by re-administering MOCA version 7.3. Pt scored 13 out of 30 points with 26 and above considered normal. Although pt continues to demonstrate deficits in working memory, attention, visuo-spatial skills, and problem solving, pt's score increased 2 points compared to last administration on 08/14/2017. Pt left upright in bed with alarm on and all needs within reach. Continue with current plan of care.     Function:  Eating Eating   Modified Consistency Diet: Yes Eating  Assist Level: More than reasonable amount of time;Supervision or verbal cues   Eating Set Up Assist For: Opening containers;Cutting food       Cognition Comprehension Comprehension assist level: Follows basic conversation/direction with no assist  Expression Expression assistive device: Talk trach valve Expression assist level: Expresses basic 75 - 89% of the time/requires cueing 10 - 24% of the time. Needs helper to occlude trach/needs to repeat words.  Social Interaction Social Interaction assist level: Interacts appropriately 90% of the time - Needs monitoring or encouragement for participation or interaction.  Problem Solving Problem solving assist level: Solves basic 50 - 74% of the time/requires cueing 25 - 49% of the time  Memory Memory assist level: Recognizes or recalls 50 - 74% of the time/requires cueing 25 - 49% of the time    Pain No/denies pain  Therapy/Group: Individual Therapy  Meredeth Ide  SLP - Student 08/18/2017, 12:07 PM

## 2017-08-18 NOTE — Progress Notes (Signed)
Occupational Therapy Session Note  Patient Details  Name: [name] MRN: [id number] Date of Birth: 08/13/1940  Today's Date: 08/18/2017 OT Individual Time: 1431-1457 OT Individual Time Calculation (min): 26 min    Short Term Goals: Week 1:  OT Short Term Goal 1 (Week 1): Pt will recall and utilize sternal pre-cautions during functional tasks with mod A OT Short Term Goal 2 (Week 1): Pt will complete stand pivot transfer to BSC/ toilet with mod A OT Short Term Goal 3 (Week 1): Pt will complete 1/3 toileting tasks with mod steadying assist in order to decrease caregiver burden OT Short Term Goal 4 (Week 1): Pt will don shirt with min A  Skilled Therapeutic Interventions/Progress Updates:    1;1.Pt supine in bed asleep upon arrival, easily aroused and agreeable to tx. Pt requesting to stretch B hips in tx gym. Pt completes supine>sitting EOB with MOD A for trunk elevation d/t sternal precautions and VC for sequencing transition. Pt completes stand pivot transfers EOB>w/c<>EOM with MOD A for lifting, manual facilitation of weight shift forward and VC for foot placement prior to transfer. Pt lays in supine with HOB elevated on wedge pillow one knee bent supported on mat and other leg with ankle crossed over knee, hip externally rotated to stretch glutes/hip. Pt educated on completing while in bed in free time to also improve ROM/independence with donning socks and shoes. Exited session with pt seated in w/c, call light in reach, husband present and all needs met.   Therapy Documentation Precautions:  Precautions Precautions: Fall, Sternal Precaution Comments: Reinforced sternal precautions Restrictions Weight Bearing Restrictions: Yes(Sternal Precautions. ) Other Position/Activity Restrictions: sternal precautions  See Function Navigator for Current Functional Status.   Therapy/Group: Individual Therapy   M  08/18/2017, 4:11 PM  

## 2017-08-18 NOTE — Plan of Care (Signed)
  Consults RH GENERAL PATIENT EDUCATION Description See Patient Education module for education specifics. 08/18/2017 1200 - Progressing by Dietrich Pates, RN   RH BOWEL ELIMINATION RH STG MANAGE BOWEL W/MEDICATION W/ASSISTANCE Description STG Manage Bowel with Medication with Mod  I Assistance.   08/18/2017 1200 - Progressing by Dietrich Pates, RN   RH SKIN INTEGRITY RH STG SKIN FREE OF INFECTION/BREAKDOWN Description Mod I  08/18/2017 1200 - Progressing by Dietrich Pates, RN   RH SAFETY RH STG ADHERE TO SAFETY PRECAUTIONS W/ASSISTANCE/DEVICE Description STG Adhere to Safety Precautions With Min Assistance/Device.  08/18/2017 1200 - Progressing by Dietrich Pates, RN

## 2017-08-18 NOTE — Progress Notes (Signed)
[facility] PHYSICAL MEDICINE & REHABILITATION     PROGRESS NOTE    Subjective/Complaints: States that trach felt "heavy" when capped. Tolerated capping for a few hours? Yesterday. sats down to 90% with more WOB noted.  ROS: pt denies nausea, vomiting, diarrhea, cough, shortness of breath or chest pain   Objective: Vital Signs:  Blood pressure (!) 115/59, pulse 85, temperature 98.5 F (36.9 C), temperature source Oral, resp. rate 20, height 5\' 5"  (1.651 m), weight 73.4 kg (161 lb 13.1 oz), SpO2 95 %. Vas [place] Lower Extremity Venous (dvt)  Result Date: 08/15/2017  Lower Venous Study Indication: Edema. Risk Factors: Surgery aortic aneurysm repair. Examination Guidelines: A complete evaluation includes B-mode imaging, spectral doppler, color doppler, and power doppler as needed of all accessible portions of each vessel. Bilateral testing is considered an integral part of a complete examination. Limited examinations for reoccurring indications may be performed as noted. The reflux portion of the exam is performed with the patient in reverse Trendelenburg.  Right Venous Findings: +---------+---------------+---------+-----------+----------+-----------------+          CompressibilityPhasicitySpontaneityPropertiesSummary           +---------+---------------+---------+-----------+----------+-----------------+ CFV      Full           Yes      Yes                                    +---------+---------------+---------+-----------+----------+-----------------+ FV Prox  Full           Yes      Yes                                    +---------+---------------+---------+-----------+----------+-----------------+ FV Mid   Full           Yes      Yes                                    +---------+---------------+---------+-----------+----------+-----------------+ FV DistalFull           Yes      Yes                                     +---------+---------------+---------+-----------+----------+-----------------+ PFV      Full           Yes      Yes                                    +---------+---------------+---------+-----------+----------+-----------------+ POP      Full           Yes      Yes                                    +---------+---------------+---------+-----------+----------+-----------------+ PTV      Full                                                           +---------+---------------+---------+-----------+----------+-----------------+  PERO     Full                                                           +---------+---------------+---------+-----------+----------+-----------------+ GSV      Partial                                      Age Indeterminate +---------+---------------+---------+-----------+----------+-----------------+  Left Venous Findings: +---------+---------------+---------+-----------+----------+-------+          CompressibilityPhasicitySpontaneityPropertiesSummary +---------+---------------+---------+-----------+----------+-------+ CFV      Full           Yes      Yes                          +---------+---------------+---------+-----------+----------+-------+ FV Prox  Full           Yes      Yes                          +---------+---------------+---------+-----------+----------+-------+ FV Mid   Full           Yes      Yes                          +---------+---------------+---------+-----------+----------+-------+ FV DistalFull           Yes      Yes                          +---------+---------------+---------+-----------+----------+-------+ PFV      Full           Yes      Yes                          +---------+---------------+---------+-----------+----------+-------+ POP      Full           Yes      Yes                          +---------+---------------+---------+-----------+----------+-------+ PTV      Full                                                  +---------+---------------+---------+-----------+----------+-------+ PERO     Full                                                 +---------+---------------+---------+-----------+----------+-------+ GSV      Full                                                 +---------+---------------+---------+-----------+----------+-------+    Final Interpretation: Right: There is no evidence of deep vein thrombosis  in the lower extremity. Positive for an age interminate superficial thrombosis of the greater saphenous vein just distal to the confluence with the saphenofemoral juction. There is no evidence of a Baker's cyst. Left: There is no evidence of deep vein thrombosis in the lower extremity.There is no evidence of superficial venous thrombosis. There is no evidence of a Baker's cyst.  *See table(s) above for measurements and observations. Electronically signed by Deitra Mayo on 08/15/2017 at 4:50:03 PM.    No results for input(s): WBC, HGB, HCT, PLT in the last 72 hours. No results for input(s): NA, K, CL, GLUCOSE, BUN, CREATININE, CALCIUM in the last 72 hours.  Invalid input(s): CO CBG (last 3)  Recent Labs    08/15/17 2346 08/16/17 0342 08/16/17 0757  GLUCAP 81 72 86    Wt Readings from Last 3 Encounters:  08/18/17 73.4 kg (161 lb 13.1 oz)  08/12/17 73.8 kg (162 lb 11.2 oz)  07/06/17 76.5 kg (168 lb 11.2 oz)    Physical Exam:  HENT:  Head: Normocephalic.  Nasogastric tube in place.   Eyes:  Pupils round and reactive to light  Neck:  Trach tube in place. #4 with PMV   Cardiac: RRR without murmur. No JVD   Respiratory: clear, no rhonchi or wheezes  GI: Soft. Bowel sounds are normal. She exhibits no distension. There is no tenderness. There is no rebound.  Musculoskeletal: She exhibits no tenderness or deformity.  Neurological:  Sitting up in chair. Makes good eye contact with examiner. Followed full commands. Oriented to  name, hospital.  UE 4- to 4/5 prox to distal. LE:   2+ HF, 3- KE and 3+ ADF/PF. No focal sensory deficits --STM deficits Psychiatric: pleasant     Assessment/Plan: 1.  Functional deficits secondary to debility which require 3+ hours per day of interdisciplinary therapy in a comprehensive inpatient rehab setting. Physiatrist is providing close team supervision and 24 hour management of active medical problems listed below. Physiatrist and rehab team continue to assess barriers to discharge/monitor patient progress toward functional and medical goals.  Function:  Bathing Bathing position Bathing activity did not occur: (per nursing only peri care done, patient refuses bathing ) Position: Shower  Bathing parts Body parts bathed by patient: Right arm, Left arm, Chest, Abdomen, Front perineal area, Right upper leg, Left upper leg Body parts bathed by helper: Buttocks, Right lower leg, Left lower leg, Back  Bathing assist Assist Level: Touching or steadying assistance(Pt > 75%)      Upper Body Dressing/Undressing Upper body dressing   What is the patient wearing?: Hospital gown         Button up shirt - Perfomed by patient: Thread/unthread right sleeve, Thread/unthread left sleeve Button up shirt - Perfomed by helper: Pull shirt around back, Button/unbutton shirt    Upper body assist Assist Level: Touching or steadying assistance(Pt > 75%)      Lower Body Dressing/Undressing Lower body dressing   What is the patient wearing?: Pants, Shoes       Pants- Performed by helper: Thread/unthread right pants leg, Thread/unthread left pants leg, Pull pants up/down Non-skid slipper socks- Performed by patient: Don/doff left sock Non-skid slipper socks- Performed by helper: Don/doff right sock   Socks - Performed by helper: Don/doff right sock, Don/doff left sock   Shoes - Performed by helper: Don/doff right shoe, Don/doff left shoe, Fasten right, Fasten left          Lower body  assist  Toileting Toileting   Toileting steps completed by patient: Performs perineal hygiene Toileting steps completed by helper: Adjust clothing prior to toileting    Toileting assist Assist level: Touching or steadying assistance (Pt.75%)   Transfers Chair/bed transfer   Chair/bed transfer method: Stand pivot Chair/bed transfer assist level: Moderate assist (Pt 50 - 74%/lift or lower) Chair/bed transfer assistive device: Medical sales representative     Max distance: 30 Assist level: Touching or steadying assistance (Pt > 75%)   Wheelchair Wheelchair activity did not occur: Safety/medical concerns Type: Manual   Assist Level: Dependent (Pt equals 0%)  Cognition Comprehension Comprehension assist level: Follows basic conversation/direction with no assist  Expression Expression assist level: Expresses basic 75 - 89% of the time/requires cueing 10 - 24% of the time. Needs helper to occlude trach/needs to repeat words.  Social Interaction Social Interaction assist level: Interacts appropriately 90% of the time - Needs monitoring or encouragement for participation or interaction.  Problem Solving Problem solving assist level: Solves basic 75 - 89% of the time/requires cueing 10 - 24% of the time  Memory Memory assist level: Recognizes or recalls 50 - 74% of the time/requires cueing 25 - 49% of the time  Medical Problem List and Plan:  1. Debility secondary to debility related to abdominal aneurysm/AS with AVR 07/10/2017 status post repair and subsequent cardiogenic shock  -Continue therapies PT, OT, speech therapies 2. DVT Prophylaxis/Anticoagulation: Subcutaneous Lovenox. Dopplers negative 3. Pain Management: Tylenol as needed  4. Mood: Klonopin 1 mg daily at bedtime, Seroquel 75 mg bedtime, Effexor 37.5 mg twice a day    -Klonopin as needed during the day for anxiety  5. Neuropsych: This patient is capable of making decisions on her own behalf.  6. Skin/Wound Care:  Routine skin checks  7. Fluids/Electrolytes/Nutrition: We will discontinue scheduled continuous tube feeds and provide free water through the tube as well as protein supplements only to help stimulate appetite.      -   BUN slightly elevated but stable.      -dietary intake is poor. now on D3 and thin.    -re-check labs Monday   -add megace today 8. Tracheostomy 07/25/2017. Downsized to #4 cuffless 08/08/2017. No current plan to decannulate due to failure with capping. Follow-up per Dr. Nelda Marseille.    -Continue nebulizers, trach care and suction   -Guaifenesin to help with secretions   -continue with PMV    -CONTINUE CAPPING TRIALS during day.      -tolerated briefly yesterday 9. Atrial fibrillation. Amiodarone 200 mg daily. Cardiac rate controlled  10. Dysphagia. Advanced to D3/thins 11. Acute diastolic congestive heart failure. Lasix 40 mg daily, Aldactone 25 mg daily. Monitor for any signs of fluid overload    -continue current diuretics.  BUN stable --recheck Monday Filed Weights   08/16/17 0005 08/17/17 0329 08/18/17 0513  Weight: 74.3 kg (163 lb 12.8 oz) 74.1 kg (163 lb 5.8 oz) 73.4 kg (161 lb 13.1 oz)   12. Hypothyroidism. Synthroid  13. Hyperglycemia secondary to tube feeds. --cbg's low---stop cbg checks    LOS (Days) 6 A FACE TO FACE EVALUATION WAS PERFORMED  Meredith Staggers, MD 08/18/2017 9:14 AM

## 2017-08-18 NOTE — Plan of Care (Signed)
  Consults RH GENERAL PATIENT EDUCATION Description See Patient Education module for education specifics. 08/18/2017 0432 - Progressing by Etheleen Nicks, RN   RH BOWEL ELIMINATION RH STG MANAGE BOWEL WITH ASSISTANCE Description STG Manage Bowel with Mod I Assistance.   08/18/2017 0432 - Progressing by Etheleen Nicks, RN RH STG MANAGE BOWEL W/MEDICATION W/ASSISTANCE Description STG Manage Bowel with Medication with Mod  I Assistance.   08/18/2017 0432 - Progressing by Etheleen Nicks, RN   RH SKIN INTEGRITY RH STG SKIN FREE OF INFECTION/BREAKDOWN Description Mod I  08/18/2017 0432 - Progressing by Etheleen Nicks, RN   RH SAFETY RH STG ADHERE TO SAFETY PRECAUTIONS W/ASSISTANCE/DEVICE Description STG Adhere to Safety Precautions With Min Assistance/Device.  08/18/2017 0432 - Progressing by Etheleen Nicks, RN

## 2017-08-18 NOTE — Progress Notes (Signed)
Physical Therapy Session Note  Patient Details  Name: [name] MRN: [id number] Date of Birth: 05/04/1941  Today's Date: 08/18/2017 PT Individual Time: 4665-9935 PT Individual Time Calculation (min): 76 min   Short Term Goals: Week 1:  PT Short Term Goal 1 (Week 1): Pt will perform <>stand with mod assist while maintaining sternal precautions PT Short Term Goal 2 (Week 1): Pt will perform stand pivot transfer with min assist and use of RW PT Short Term Goal 3 (Week 1): Pt will ambualted x 50 ft with RW and mod assist  Skilled Therapeutic Interventions/Progress Updates:  Pt received in bed with RN present administering meds; pt denied c/o pain. After RN exited pt continued to report feeling a pill caught in her throat - educated pt on need to consume pudding/applesauce/yogurt instead of saltine crackers to help move pill & pt eventually able to do so. RN & SLP made aware of pt's difficulty swallowing larger pill. Donned ted hose total assist for time management & assisted pt with donning pants, shoes & new gown. Pt utilizes UE to assist LE to EOB & completes sit<>stand from elevated bed with max cuing for compensatory technique and to not push with BUE. Pt requires assistance to pull pants over hips 2/2 impaired balance. Pt requesting assistance to brush hair in back 2/2 precautions. (Reviewed sternal precautions with pt.) Pt completes stand step transfer bed>w/c with RW & max assist to control descent to sitting; educated pt on proper technique to square up to w/c, reach back, and sit slowly. Transported pt to gym via w/c total assist for time management. Pt with slightly improving confidence and ability to complete sit>stand transfers with continued cuing & compensatory pattern with max assist. Pt ambulates 33 ft + 40 ft with RW & steady assist with short step length BLE, decreased stride length, and significantly reduced gait speed. Pt reports slight fatigue and SOB after each trial but SpO2  remained 95-97% on room air; pt taking seated rest breaks between each ambulation trial. Therapist educated pt on pursed lip breathing with fair/poor return demo. At end of session pt left sitting in w/c in room with QRB donned & all needs within reach.   Therapy Documentation Precautions:  Precautions Precautions: Fall, Sternal Precaution Comments: Reinforced sternal precautions Restrictions Weight Bearing Restrictions: (sternal precautions) Other Position/Activity Restrictions: sternal precautions   See Function Navigator for Current Functional Status.   Therapy/Group: Individual Therapy  Waunita Schooner 08/18/2017, 10:56 AM

## 2017-08-18 NOTE — Progress Notes (Signed)
Pt capped at this time per MD order.  PT tolerating well.  RT will continue to monitor.

## 2017-08-18 NOTE — Progress Notes (Signed)
Attempted to place cap on Trach per MD order.  Pt did not tolerate well.  O2 sat decreased to 80%.  Pt became very short of breath and coughing and could not catch her breath.  Cap removed O2 sat increased to 96%.  RT will continue to monitor.

## 2017-08-18 NOTE — Progress Notes (Signed)
Occupational Therapy Session Note  Patient Details  Name: [name] MRN: [id number] Date of Birth: August 12, 1940  Today's Date: 08/18/2017 OT Individual Time: 1115-1200 OT Individual Time Calculation (min): 45 min    Short Term Goals: Week 1:  OT Short Term Goal 1 (Week 1): Pt will recall and utilize sternal pre-cautions during functional tasks with mod A OT Short Term Goal 2 (Week 1): Pt will complete stand pivot transfer to BSC/ toilet with mod A OT Short Term Goal 3 (Week 1): Pt will complete 1/3 toileting tasks with mod steadying assist in order to decrease caregiver burden OT Short Term Goal 4 (Week 1): Pt will don shirt with min A  Skilled Therapeutic Interventions/Progress Updates:    1:1. Pt and husband present for session. Pt agreeable to bathing and dressing at sink level. Pt bathes UB with supervision and A to wash back. Pt sit to stand 3x during session with min-mod A for lifting/steadying assistance with VC for body mechanics, precautions and anterior weight shifting. Pt able to push pants past hips, wash buttocks and wash peri area, however pt requires A to advance pants past hips after threading over B feet. OT dons ted hose and non slip socks. Pt grooms at sink with set up w/c far from sink to facilitate anterior weight shift in prep for sit to stands/strengthen core. Exited session with pt seated in w/c, QRB donned, call light in reach and husband in room. Pt remains with PMSV on. thoruhgout session O2 greater than 90% with min cues for breathing.   Therapy Documentation Precautions:  Precautions Precautions: Fall, Sternal Precaution Comments: Reinforced sternal precautions Restrictions Weight Bearing Restrictions: Yes(Sternal Precautions. ) Other Position/Activity Restrictions: sternal precautions  See Function Navigator for Current Functional Status.   Therapy/Group: Individual Therapy  Tonny Branch 08/18/2017, 12:05 PM

## 2017-08-18 NOTE — Progress Notes (Signed)
Called to room by RN due to Patient coughing out Red Cap placed on Trach. Patient was in no distress and plan was to to remove per bedtime. Patient states she is ready to try and sleep. No disposable inner cannula replaced and patient placed on ATC with humidified RA. SpO2 currently 96%. Will continue to monitor.

## 2017-08-19 ENCOUNTER — Inpatient Hospital Stay (HOSPITAL_COMMUNITY): Payer: Medicare Other

## 2017-08-19 LAB — GLUCOSE, CAPILLARY: GLUCOSE-CAPILLARY: 90 mg/dL (ref 65–99)

## 2017-08-19 NOTE — Progress Notes (Signed)
RN capped trach this morning at 9am with oxygen ranging 93-97% throughout morning. Pt requested to have PMSV on after lunch for a while due to anxiety. RN educated pt and husband on meaning of capping trach, steps for trach removal, and anticipated needs for removal of trach. RN removed cap at 1230 and placed PMSV with pt request and replaced cap at 1400 with encouragement from staff. At 1600 pt having coughing episode with oxygen 92%. RN removed cap from trach with no mucus plug present in cap. Pt requested deep suction with no sputum present. Pt refused the cap remainder of shift. PMSV placed with 97% oxygen. Pt coughing up sputum via mouth with moderate amount throughout shift, thick white mucus. RN continues to educate pt and family on trach management, PMSV vs. Capped trach, and management of secretions. Pt having evident anxiety when trach is capped and discussion of capped trach vs. PMSV.

## 2017-08-19 NOTE — Progress Notes (Signed)
Occupational Therapy Session Note  Patient Details  Name: [name] MRN: [id number] Date of Birth: [birth year]  Today's Date: 08/19/2017 OT Individual Time: 8850-2774 OT Individual Time Calculation (min): 55 min    Short Term Goals: Week 1:  OT Short Term Goal 1 (Week 1): Pt will recall and utilize sternal pre-cautions during functional tasks with mod A OT Short Term Goal 2 (Week 1): Pt will complete stand pivot transfer to BSC/ toilet with mod A OT Short Term Goal 3 (Week 1): Pt will complete 1/3 toileting tasks with mod steadying assist in order to decrease caregiver burden OT Short Term Goal 4 (Week 1): Pt will don shirt with min A  Skilled Therapeutic Interventions/Progress Updates:    1:1. Pt requesting to shower and toilet. Pt supine HOB elevated with touching A and sit to stand with MOD A lifting assistance and VC for foot/hand placement. Pt ambulates with up to MOD A for balance into bathroom to void on toilet. Pt able to complete clothing management prior to voiding on toilet. Pt trnasfer onto shower chair in shower ambulatory as stated above. Pt bathes at sit to stand level in shower with trach collar covered and VC for lateral leans to wash buttocks. OT washes B feet. Pt dons night gown with A to thread head d/t decreased shoulder strength. Pt dresses UB seated in on shower chair and OT dons non slip socks for time management. Pt completes ambulatory trnasfer to w/c at sink. Pt sit tos tand to pull up brief with MOD A for lifting and cueing as stated above. PT remains standing to brush teeth for improved endurance with touching A and Vc for hip extension. Pt requires max VC to notice thread RLE into wrong pant leg. Pt instructed to thread LLE into pants first as leg with decreased ROM. Pt requires A to thread RLE in seated figure four as  Foot stuck in other pant leg. Pt requires A to fully advance pants past hips in standing. Pt dons B socks with supervision. Pt transfers back to bed  in same manner as above. Exited session with pt seated HOB elevated and call light in reach.  Therapy Documentation Precautions:  Precautions Precautions: Fall, Sternal Precaution Comments: Reinforced sternal precautions Restrictions Weight Bearing Restrictions: (sternal precautions) Other Position/Activity Restrictions: sternal precautions General:   Vital Signs: Therapy Vitals Temp: 98 F (36.7 C) Temp Source: Oral Pulse Rate: 85 Resp: 18 BP: 119/62 Patient Position (if appropriate): Lying Oxygen Therapy SpO2: 94 % O2 Device: Tracheostomy Collar O2 Flow Rate (L/min): 5 L/min FiO2 (%): 21 %  See Function Navigator for Current Functional Status.   Therapy/Group: Individual Therapy  Tonny Branch 08/19/2017, 9:14 AM

## 2017-08-19 NOTE — Progress Notes (Signed)
Patient ID: [name], female   DOB: 04-Sep-1940, 77 y.o.   MRN: [id number]   [name] is a 77 y.o. female admitted for CIR with  debility related to abdominal aneurysm/AS with AVR 07/10/2017 status post repair and subsequent cardiogenic shock    Past Medical History:  Diagnosis Date  . ACL tear    right  Dr. Gladstone Pih   . Adenomatous polyp   . Anxiety   . Aorta aneurysm ([place])   . Arthritis   . Ascending aortic aneurysm ([place])    note per chart per Dr Lucianne Lei Tright 4.7 cm 04/15/2015   . B12 deficiency   . Cancer ([place])   . Cataracts, both eyes 10/2006  . Chronic bronchitis ([place])   . Constipation   . Depression   . DUB (dysfunctional uterine bleeding) 10/96  . ETD (eustachian tube dysfunction)   . Fall   . Fibromyalgia   . GERD (gastroesophageal reflux disease)   . Glaucoma    (SE) Dr. Arnoldo Morale   . Glaucoma    bilaterally  . Helicobacter pylori (H. pylori)   . Hiatal hernia   . History of bronchitis   . History of frequent urinary tract infections   . History of kidney stones   . History of left shoulder fracture    pt states fell off bed and broke ball in shoulder had rod placed   . Hyperlipidemia   . Hypertension   . Hypothyroidism   . Insomnia   . Leg wound, left    healed   . MVP (mitral valve prolapse)   . Occasional tremors    left arm   . Osteoarthritis   . Osteopenia   . Osteoporosis   . Other and unspecified hyperlipidemia   . Post-menopausal   . Thyroid cancer ([place])   . Thyroid nodule   . Tremor       Subjective: No new complaints. No new problems. Slept well. Feeling OK.  Objective: Vital signs in last 24 hours: Temp:  [98 F (36.7 C)] 98 F (36.7 C) (02/23 0600) Pulse Rate:  [84-96] 85 (02/23 0600) Resp:  [16-20] 18 (02/23 0600) BP: (119-136)/(62-80) 119/62 (02/23 0600) SpO2:  [94 %-97 %] 96 % (02/23 1000) FiO2 (%):  [21 %-96 %] 21 % (02/23 0810) Weight:  [161 lb 6 oz (73.2 kg)] 161 lb 6 oz (73.2 kg) (02/23 0500) Weight change: -7.1 oz  (-0.2 kg) Last BM Date: 08/17/17  Intake/Output from previous day: 02/22 0701 - 02/23 0700 In: 310 [P.O.:310] Out: -  Last cbgs: CBG (last 3)  No results for input(s): GLUCAP in the last 72 hours.  Patient Vitals for the past 24 hrs:  BP Temp Temp src Pulse Resp SpO2 Weight  08/19/17 1000 - - - - - 96 % -  08/19/17 0810 - - - - - 94 % -  08/19/17 0600 119/62 98 F (36.7 C) Oral 85 18 95 % -  08/19/17 0500 - - - - - - 161 lb 6 oz (73.2 kg)  08/19/17 0331 - - - - - 95 % -  08/19/17 0120 - - - 84 16 95 % -  08/18/17 2309 - - - - - 94 % -  08/18/17 2044 - - - - - 95 % -  08/18/17 1950 - - - 86 20 95 % -  08/18/17 1805 - - - 88 20 96 % -  08/18/17 1630 - - - 96 18 - -  08/18/17 1431  136/80 98 F (36.7 C) Oral 96 18 97 % -   Physical Exam General: No apparent distress alert; tracheostomy HEENT: Status post tracheostomy Lungs: Normal effort. Lungs clear to auscultation, no crackles or wheezes. Cardiovascular: Regular rate and rhythm, no edema Abdomen: S/NT/ND; BS(+) Musculoskeletal:  unchanged Neurological: No new neurological deficits Wounds: Bruising over the anterior lower legs.  But no edema Skin: clear  Aging changes  Mental state: Alert, oriented, cooperative    Lab Results: BMET    Component Value Date/Time   NA 139 08/15/2017 0518   NA 143 03/15/2017 0920   K 3.7 08/15/2017 0518   CL 94 (L) 08/15/2017 0518   CO2 32 08/15/2017 0518   GLUCOSE 96 08/15/2017 0518   BUN 33 (H) 08/15/2017 0518   BUN 12 03/15/2017 0920   CREATININE 0.68 08/15/2017 0518   CREATININE 0.65 10/25/2012 0826   CALCIUM 8.1 (L) 08/15/2017 0518   GFRNONAA >60 08/15/2017 0518   GFRNONAA >89 10/25/2012 0826   GFRAA >60 08/15/2017 0518   GFRAA >89 10/25/2012 0826   CBC    Component Value Date/Time   WBC 6.8 08/14/2017 0631   RBC 3.87 08/14/2017 0631   HGB 11.9 (L) 08/14/2017 0631   HGB 11.8 03/15/2017 0920   HCT 38.6 08/14/2017 0631   HCT 36.3 03/15/2017 0920   PLT 209 08/14/2017  0631   PLT 251 03/15/2017 0920   MCV 99.7 08/14/2017 0631   MCV 87 03/15/2017 0920   MCH 30.7 08/14/2017 0631   MCHC 30.8 08/14/2017 0631   RDW 16.5 (H) 08/14/2017 0631   RDW 13.8 03/15/2017 0920   LYMPHSABS 1.9 08/14/2017 0631   LYMPHSABS 1.4 03/15/2017 0920   MONOABS 0.3 08/14/2017 0631   EOSABS 0.1 08/14/2017 0631   EOSABS 0.2 03/15/2017 0920   BASOSABS 0.0 08/14/2017 0631   BASOSABS 0.0 03/15/2017 0920    Studies/Results: No results found.  Medications: I have reviewed the patient's current medications.  Assessment/Plan:  Debility secondary to debility related to abdominal aneurysm/AS with AVR 07/10/2017 status post repair and subsequent cardiogenic shock  -Continue therapies PT, OT, speech therapies  DVT prophylaxis.  Continue Lovenox Status post tracheostomy.  Follow-up pulmonary medicine.  Continue trach care suctioning as needed and nebulizer treatment Atrial fibrillation continue amiodarone and rate control History of diastolic heart failure.  Compensated     Length of stay, days: [place] , MD 08/19/2017, 12:32 PM

## 2017-08-20 ENCOUNTER — Inpatient Hospital Stay (HOSPITAL_COMMUNITY): Payer: Medicare Other | Admitting: Occupational Therapy

## 2017-08-20 NOTE — Progress Notes (Signed)
Patient ID: [name], female   DOB: 06/02/1941, 77 y.o.   MRN: [id number]   [name] is a 77 y.o. female admitted for CIR with general debility following surgery for abdominal aneurysm/left ear with AVR repair on July 10, 3662 complicated by cardiogenic shock.  Past Medical History:  Diagnosis Date  . ACL tear    right  Dr. Gladstone Pih   . Adenomatous polyp   . Anxiety   . Aorta aneurysm ([place])   . Arthritis   . Ascending aortic aneurysm ([place])    note per chart per Dr Lucianne Lei Tright 4.7 cm 04/15/2015   . B12 deficiency   . Cancer ([place])   . Cataracts, both eyes 10/2006  . Chronic bronchitis ([place])   . Constipation   . Depression   . DUB (dysfunctional uterine bleeding) 10/96  . ETD (eustachian tube dysfunction)   . Fall   . Fibromyalgia   . GERD (gastroesophageal reflux disease)   . Glaucoma    (SE) Dr. Arnoldo Morale   . Glaucoma    bilaterally  . Helicobacter pylori (H. pylori)   . Hiatal hernia   . History of bronchitis   . History of frequent urinary tract infections   . History of kidney stones   . History of left shoulder fracture    pt states fell off bed and broke ball in shoulder had rod placed   . Hyperlipidemia   . Hypertension   . Hypothyroidism   . Insomnia   . Leg wound, left    healed   . MVP (mitral valve prolapse)   . Occasional tremors    left arm   . Osteoarthritis   . Osteopenia   . Osteoporosis   . Other and unspecified hyperlipidemia   . Post-menopausal   . Thyroid cancer ([place])   . Thyroid nodule   . Tremor      Subjective: No new complaints. No new problems.  Less problems with tracheal secretions  Objective: Vital signs in last 24 hours: Temp:  [97 F (36.1 C)-98 F (36.7 C)] 98 F (36.7 C) (02/24 0320) Pulse Rate:  [82-91] 89 (02/24 0416) Resp:  [16-20] 16 (02/24 0416) BP: (113-117)/(52-71) 113/71 (02/24 0320) SpO2:  [92 %-97 %] 94 % (02/24 0721) FiO2 (%):  [21 %] 21 % (02/24 0416) Weight:  [159 lb 9.8 oz (72.4 kg)] 159 lb 9.8  oz (72.4 kg) (02/24 0752) Weight change:  Last BM Date: 08/17/17  Intake/Output from previous day: 02/23 0701 - 02/24 0700 In: 400 [P.O.:400] Out: -  Last cbgs: CBG (last 3)  Recent Labs    08/19/17 2137  GLUCAP 90     Physical Exam General: No apparent distress   HEENT: Tracheostomy tube capped Lungs: Normal effort. Lungs clear to auscultation, no crackles or wheezes. Cardiovascular: Regular rate and rhythm, no edema; status post healed sternotomy; upper abdominal laparoscopic incisions healing Abdomen: S/NT/ND; BS(+) Musculoskeletal:  unchanged Neurological: No new neurological deficits Wounds: Healing nicely Skin: Bruising over anterior lower legs Mental state: Alert, oriented, cooperative    Lab Results: BMET    Component Value Date/Time   NA 139 08/15/2017 0518   NA 143 03/15/2017 0920   K 3.7 08/15/2017 0518   CL 94 (L) 08/15/2017 0518   CO2 32 08/15/2017 0518   GLUCOSE 96 08/15/2017 0518   BUN 33 (H) 08/15/2017 0518   BUN 12 03/15/2017 0920   CREATININE 0.68 08/15/2017 0518   CREATININE 0.65 10/25/2012 0826   CALCIUM 8.1 (  L) 08/15/2017 0518   GFRNONAA >60 08/15/2017 0518   GFRNONAA >89 10/25/2012 0826   GFRAA >60 08/15/2017 0518   GFRAA >89 10/25/2012 0826   CBC    Component Value Date/Time   WBC 6.8 08/14/2017 0631   RBC 3.87 08/14/2017 0631   HGB 11.9 (L) 08/14/2017 0631   HGB 11.8 03/15/2017 0920   HCT 38.6 08/14/2017 0631   HCT 36.3 03/15/2017 0920   PLT 209 08/14/2017 0631   PLT 251 03/15/2017 0920   MCV 99.7 08/14/2017 0631   MCV 87 03/15/2017 0920   MCH 30.7 08/14/2017 0631   MCHC 30.8 08/14/2017 0631   RDW 16.5 (H) 08/14/2017 0631   RDW 13.8 03/15/2017 0920   LYMPHSABS 1.9 08/14/2017 0631   LYMPHSABS 1.4 03/15/2017 0920   MONOABS 0.3 08/14/2017 0631   EOSABS 0.1 08/14/2017 0631   EOSABS 0.2 03/15/2017 0920   BASOSABS 0.0 08/14/2017 0631   BASOSABS 0.0 03/15/2017 0920    Medications: I have reviewed the patient's current  medications.  Assessment/Plan:  General debility secondary to complex medical disease.  Status post surgery for abdominal aneurysm/left ear with AVR on July 10, 2017.  Status post cardiac shock.  Continue OT and PT DVT prophylaxis.  Continue Lovenox Status post tracheostomy.  Pulmonary medicine to follow continue trach care and suctioning as needed Atrial fibrillation continue amiodarone and rate control History of diastolic heart failure.  Appears compensated    Length of stay, days: [place] , MD 08/20/2017, 9:38 AM

## 2017-08-20 NOTE — Progress Notes (Signed)
Pt refusing to attempt to capp trach throughout shift. Pt has had increased thick secretions with increased coughing. One episode of emesis this morning. Pt currently has PMSV in place with oral suction at bedside.

## 2017-08-20 NOTE — Progress Notes (Signed)
Occupational Therapy Session Note  Patient Details  Name: [name] MRN: [id number] Date of Birth: [birth year]  Today's Date: 08/20/2017 OT Individual Time: 8466-5993 OT Individual Time Calculation (min): 60 min    Short Term Goals: Week 1:  OT Short Term Goal 1 (Week 1): Pt will recall and utilize sternal pre-cautions during functional tasks with mod A OT Short Term Goal 2 (Week 1): Pt will complete stand pivot transfer to BSC/ toilet with mod A OT Short Term Goal 3 (Week 1): Pt will complete 1/3 toileting tasks with mod steadying assist in order to decrease caregiver burden OT Short Term Goal 4 (Week 1): Pt will don shirt with min A  Skilled Therapeutic Interventions/Progress Updates:    treatment session focused on ADLs/self care training, transfer training, balance training, and pt education on safety awareness and energy conservation techniques. Upon entering room pt supine in bed and agreeable to AM ADLs despite not sleeping well last night. Therapist provided pt with v/c for bed mob to move from supine to EOB with sternal precautions. Pt sat EOB for deep breathing exercises prior to transferring into w/c with STP and mod/max A d/t posterior lean. Therapist educated pt on proper weight shifting techniques for transfers for decreased balance strategies. Pt completed w/c>toilet>shower bench transfers with walker with mod A and v/c for sternal precaution reminders. Pt completed showering on shower bench in sit level for bathing. Noted fatigue during shower without getting trach or her hair wet per request. Pt completed dressing task while in w/c. Therapist instructed pt on energy conservation during dressing task to avoid fatigue and overexertion. Pt completed grooming at sinkside in w/c. With S. Transferred from w/c to bed with mod A and repositioned in bed with max A for comfort to rest. Pt left with all needs met.   Therapy Documentation Precautions:  Precautions Precautions: Fall,  Sternal Precaution Comments: Reinforced sternal precautions Restrictions Weight Bearing Restrictions: (sternal precautions) Other Position/Activity Restrictions: sternal precautions   Vital Signs: Oxygen Therapy SpO2: 94 % O2 Device: Not Delivered Pain: Pain Assessment Pain Assessment: No/denies pain  See Function Navigator for Current Functional Status.   Therapy/Group: Individual Therapy  Delon [place] 08/20/2017, 12:22 PM

## 2017-08-21 ENCOUNTER — Inpatient Hospital Stay (HOSPITAL_COMMUNITY): Payer: Medicare Other

## 2017-08-21 ENCOUNTER — Inpatient Hospital Stay (HOSPITAL_COMMUNITY): Payer: Medicare Other | Admitting: Speech Pathology

## 2017-08-21 ENCOUNTER — Inpatient Hospital Stay (HOSPITAL_COMMUNITY): Payer: Medicare Other | Admitting: Physical Therapy

## 2017-08-21 LAB — BASIC METABOLIC PANEL
ANION GAP: 14 (ref 5–15)
Anion gap: 14 (ref 5–15)
BUN: 15 mg/dL (ref 6–20)
BUN: 15 mg/dL (ref 6–20)
CHLORIDE: 88 mmol/L — AB (ref 101–111)
CHLORIDE: 88 mmol/L — AB (ref 101–111)
CO2: 30 mmol/L (ref 22–32)
CO2: 31 mmol/L (ref 22–32)
Calcium: 8 mg/dL — ABNORMAL LOW (ref 8.9–10.3)
Calcium: 8 mg/dL — ABNORMAL LOW (ref 8.9–10.3)
Creatinine, Ser: 0.77 mg/dL (ref 0.44–1.00)
Creatinine, Ser: 0.77 mg/dL (ref 0.44–1.00)
GFR calc Af Amer: >60 mL/min (ref >60)
GFR calc non Af Amer: >60 mL/min (ref >60)
GFR calc non Af Amer: >60 mL/min (ref >60)
GLUCOSE: 93 mg/dL (ref 65–99)
Glucose, Bld: 132 mg/dL — ABNORMAL HIGH (ref 65–99)
POTASSIUM: 2.5 mmol/L — AB (ref 3.5–5.1)
POTASSIUM: 3.1 mmol/L — AB (ref 3.5–5.1)
Sodium: 132 mmol/L — ABNORMAL LOW (ref 135–145)
Sodium: 133 mmol/L — ABNORMAL LOW (ref 135–145)

## 2017-08-21 LAB — CBC
HCT: 38.3 % (ref 36.0–46.0)
HEMOGLOBIN: 12.3 g/dL (ref 12.0–15.0)
MCH: 30.5 pg (ref 26.0–34.0)
MCHC: 32.1 g/dL (ref 30.0–36.0)
MCV: 95 fL (ref 78.0–100.0)
Platelets: 196 10*3/uL (ref 150–400)
RBC: 4.03 MIL/uL (ref 3.87–5.11)
RDW: 16.5 % — ABNORMAL HIGH (ref 11.5–15.5)
WBC: 6.2 10*3/uL (ref 4.0–10.5)

## 2017-08-21 MED ORDER — POTASSIUM CHLORIDE CRYS ER 20 MEQ PO TBCR
20.0000 meq | EXTENDED_RELEASE_TABLET | Freq: Two times a day (BID) | ORAL | Status: DC
  Administered 2017-08-21: 20 meq via ORAL
  Filled 2017-08-21: qty 1

## 2017-08-21 MED ORDER — POTASSIUM CHLORIDE 10 MEQ/100ML IV SOLN
10.0000 meq | Freq: Once | INTRAVENOUS | Status: AC
Start: 2017-08-21 — End: 2017-08-21
  Administered 2017-08-21: 10 meq via INTRAVENOUS
  Filled 2017-08-21: qty 100

## 2017-08-21 MED ORDER — POTASSIUM CHLORIDE 10 MEQ/100ML IV SOLN
10.0000 meq | INTRAVENOUS | Status: AC
  Administered 2017-08-21 (×2): 10 meq via INTRAVENOUS
  Filled 2017-08-21 (×2): qty 100

## 2017-08-21 MED ORDER — GUAIFENESIN-DM 100-10 MG/5ML PO SYRP
10.0000 mL | ORAL_SOLUTION | Freq: Four times a day (QID) | ORAL | Status: DC | PRN
  Administered 2017-08-29: 10 mL via ORAL
  Filled 2017-08-21: qty 10

## 2017-08-21 NOTE — Progress Notes (Signed)
Physical Therapy Weekly Progress Note  Patient Details  Name: RALYNN SAN MRN: [id number] Date of Birth: 1940/11/14  Beginning of progress report period: August 14, 2017 End of progress report period: August 21, 2017  Today's Date: 08/21/2017 PT Individual Time: 0850-1000 PT Individual Time Calculation (min): 70 min   Patient has met 2 of 3 short term goals.  Pt continues to be limited by decreased endurance and decreased postural control, pt able to recall sternal precautions however needs reinforcement during transfers. Performs sit<>stand using RW with min A from elevated surface, requires modA and multiple attempts to complete from W/C height.   Patient continues to demonstrate the following deficits muscle weakness, decreased cardiorespiratoy endurance and decreased standing balance, decreased postural control, decreased balance strategies and difficulty maintaining precautions and therefore will continue to benefit from skilled PT intervention to increase functional independence with mobility.  Patient slowly progressing toward long term goals.  Continue plan of care.  PT Short Term Goals Week 2:  PT Short Term Goal 1 (Week 2): Pt will ambulate x 63f using RW with mod assist PT Short Term Goal 2 (Week 2): Pt will perform sit<>stand with consistent minA while maintaining sternal precuations PT Short Term Goal 3 (Week 2): Pt will perform stand pivot transfer with min assist and use of RW  Skilled Therapeutic Interventions/Progress Updates:    Pt received supine in bed handed off from nursing staff, agreeable to therapy. Ted hose and nonslip socks donned in supine with totalA to maintain precautions, pt transitions supine to sit with minA for trunk, vcing to limit use of UE. Pt maintains sitting balance for approximately 7 minutes at EOB while nursing administers meds. Pt requests to brush teeth, transfers bed<>WC using stand pivot technique using RW, modA to boost to stand, vcing  to completely turn before sitting in WC. Pt brushes teeth sitting in W/C at sink with set-up assistance, therapist assists with brushing hair in back to maintain precautions. Pt performs sit<>stand from W/C using RW with modA for boosting, requires multiple attempts and verbal cues for anterior lean. Ambulates 1 x 264fusing RW with minA for balance, decreased step length and gait speed. Pt reports SOB, requires seated rest break in W/C, satO2 maintained at 99%. Pt transported to therapy gym hallway with [place]/t precautions, performs 2 step taps onto soft cone emphasizing hip flexion and weight shift, pt loses balance and sits in W/C. Attempted to stand with toes elevated on wedge for heel cord stretch, however pt unable to maintain position >10seconds, reports experiences SOB quickly. Pt returned to room to perform BLE strengthening exercises supine in bed, RT present to administer breathing treatment, RN present to start IV. Pt performs 2 x 10 supine heel slides, SLR, abduction, ankle DF. At end of session, pt assists with scooting head towards HOB by pushing through BLE to lift hips, all needs within reach.     Therapy Documentation Precautions:  Precautions Precautions: Fall, Sternal Precaution Comments: Reinforced sternal precautions Restrictions Weight Bearing Restrictions: (sternal precautions) Other Position/Activity Restrictions: sternal precautions Vital Signs: Therapy Vitals Pulse Rate: 92 Resp: 18 Oxygen Therapy SpO2: 97 % O2 Device: Tracheostomy Collar O2 Flow Rate (L/min): 5 L/min FiO2 (%): 21 % Pain: Pain Assessment Pain Assessment: No/denies pain Pain Score: 2  Faces Pain Scale: Hurts a little bit PAINAD (Pain Assessment in Advanced Dementia) Breathing: occasional labored breathing, short period of hyperventilation Negative Vocalization: none Facial Expression: smiling or inexpressive Body Language: relaxed Consolability: no need to console  PAINAD Score: 1   See  Function Navigator for Current Functional Status.  Therapy/Group: Individual Therapy  Salvatore Decent 08/21/2017, 12:21 PM

## 2017-08-21 NOTE — Progress Notes (Signed)
Speech Language Pathology Weekly Progress and Session Note  Patient Details  Name: [name] MRN: [id number] Date of Birth: 1940/09/07  Beginning of progress report period: August 14, 2017 End of progress report period: August 21, 2017  Today's Date: 08/21/2017 SLP Individual Time: 0730-0830 SLP Individual Time Calculation (min): 60 min  Short Term Goals: Week 1: SLP Short Term Goal 1 (Week 1): Pt will donn and doff PMSV with Max A cues.  SLP Short Term Goal 1 - Progress (Week 1): Not met SLP Short Term Goal 2 (Week 1): Pt will utilize external memory aid to recall basic information with Max A cues (such as swallowing/diet recommendation information).  SLP Short Term Goal 2 - Progress (Week 1): Not met SLP Short Term Goal 3 (Week 1): Pt will demonstrate basic problem solving with familiar tasks and Mod A cues.  SLP Short Term Goal 3 - Progress (Week 1): Met SLP Short Term Goal 4 (Week 1): Pt will demonstrate sustained attention for ~ 30 minutes with supervision cues.  SLP Short Term Goal 4 - Progress (Week 1): Met SLP Short Term Goal 5 (Week 1): Pt will consume dysphagia 3 diet with thin liquids without overt s/s of aspiration with supervision cues for use of compensatory swallow strategies.  SLP Short Term Goal 5 - Progress (Week 1): Met    New Short Term Goals: Week 2: SLP Short Term Goal 1 (Week 2): Pt will donn and doff PMSV with Max A cues.  SLP Short Term Goal 2 (Week 2): Pt will utilize external memory aid to recall basic information with Max A cues (such as swallowing/diet recommendation information).  SLP Short Term Goal 3 (Week 2): Pt will demonstrate basic problem solving with familiar tasks and Min A cues.  SLP Short Term Goal 4 (Week 2): Pt will demonstrate selective attention for ~ 30 minutes with supervision cues.  SLP Short Term Goal 5 (Week 2): Pt will consume regular diet with thin liquids without overt s/s of aspiration with supervision cues for use of  compensatory swallow strategies.   Weekly Progress Updates: Pt has made good progress this reporting period and as a result has met 3 of 5 STGs. Pt has made progress specifically in the areas of attention and dysphagia. Pt was recently upgraded to dysphagia 3 diet with thin liquids. Although pt has made good progress, pt continues to require Max A cues for recall of information, selective attention and problem solving. Anticipate that pt will require skilled 24 hour supervision and follow up ST when pt discharges from CIR.      Intensity: Minumum of 1-2 x/day, 30 to 90 minutes Frequency: 3 to 5 out of 7 days Duration/Length of Stay: 3/9 Treatment/Interventions: Cognitive remediation/compensation;Cueing hierarchy;Dysphagia/aspiration precaution training;Functional tasks;Patient/family education;Therapeutic Activities;Internal/external aids;Environmental controls   Daily Session  Skilled Therapeutic Interventions: Skilled treatment session focused on cognition and dysphagia. SLP received pt in bed with trach collar in place and PMSV off. Pt with secretions coming out of trach hub and bedspread has evidence of lots of secretions droplets from suspected coughing out secretions thru trach during night. SLP suctioned trach hub and placed PMSV. Pt coughed up secretions through mouth and through hub when she coughed PMSV off. Despite Max A cues, pt unable to place PMSV on hub any during session.Pt with overall confusion (stating that socks were in living room). SLP further facilitated session by providing pt with memory book to utilize for recall especially for recall of capping trials. SLP further facilitated  session by providing skilled observation of pt consuming dysphagia 3 breakfast tray with thin liquids. Pt with intermittent cough not related to PO intake. Pt was returned to room, left upright in wheelchair with all needs within reach. Continue per current plan of care.       Function:    Eating Eating   Modified Consistency Diet: Yes Eating Assist Level: Set up assist for;Supervision or verbal cues   Eating Set Up Assist For: Opening containers       Cognition Comprehension Comprehension assist level: Follows basic conversation/direction with no assist  Expression Expression assistive device: Talk trach valve Expression assist level: Expresses basic 90% of the time/requires cueing < 10% of the time.;Expresses basic needs/ideas: With extra time/assistive device  Social Interaction Social Interaction assist level: Interacts appropriately 90% of the time - Needs monitoring or encouragement for participation or interaction.  Problem Solving Problem solving assist level: Solves basic 75 - 89% of the time/requires cueing 10 - 24% of the time  Memory Memory assist level: Recognizes or recalls 50 - 74% of the time/requires cueing 25 - 49% of the time   General    Pain Pain Assessment Pain Assessment: No/denies pain Pain Score: 2  Faces Pain Scale: Hurts a little bit PAINAD (Pain Assessment in Advanced Dementia) Breathing: occasional labored breathing, short period of hyperventilation Negative Vocalization: none Facial Expression: smiling or inexpressive Body Language: relaxed Consolability: no need to console PAINAD Score: 1  Therapy/Group: Individual Therapy  [name] 08/21/2017, 12:30 PM

## 2017-08-21 NOTE — Progress Notes (Signed)
[place] PHYSICAL MEDICINE & REHABILITATION     PROGRESS NOTE    Subjective/Complaints: Patient tolerates Passy-Muir valve but not trach capping.  ROS: pt denies nausea, vomiting, diarrhea, cough, shortness of breath or chest pain   Objective: Vital Signs:  Blood pressure 105/60, pulse 89, temperature 98.2 F (36.8 C), temperature source Oral, resp. rate 16, height 5\' 5"  (1.651 m), weight 72.1 kg (158 lb 15.2 oz), SpO2 94 %. Vas [place] Lower Extremity Venous (dvt)  Result Date: 08/15/2017  Lower Venous Study Indication: Edema. Risk Factors: Surgery aortic aneurysm repair. Examination Guidelines: A complete evaluation includes B-mode imaging, spectral doppler, color doppler, and power doppler as needed of all accessible portions of each vessel. Bilateral testing is considered an integral part of a complete examination. Limited examinations for reoccurring indications may be performed as noted. The reflux portion of the exam is performed with the patient in reverse Trendelenburg.  Right Venous Findings: +---------+---------------+---------+-----------+----------+-----------------+          CompressibilityPhasicitySpontaneityPropertiesSummary           +---------+---------------+---------+-----------+----------+-----------------+ CFV      Full           Yes      Yes                                    +---------+---------------+---------+-----------+----------+-----------------+ FV Prox  Full           Yes      Yes                                    +---------+---------------+---------+-----------+----------+-----------------+ FV Mid   Full           Yes      Yes                                    +---------+---------------+---------+-----------+----------+-----------------+ FV DistalFull           Yes      Yes                                    +---------+---------------+---------+-----------+----------+-----------------+ PFV      Full           Yes      Yes                                     +---------+---------------+---------+-----------+----------+-----------------+ POP      Full           Yes      Yes                                    +---------+---------------+---------+-----------+----------+-----------------+ PTV      Full                                                           +---------+---------------+---------+-----------+----------+-----------------+ PERO  Full                                                           +---------+---------------+---------+-----------+----------+-----------------+ GSV      Partial                                      Age Indeterminate +---------+---------------+---------+-----------+----------+-----------------+  Left Venous Findings: +---------+---------------+---------+-----------+----------+-------+          CompressibilityPhasicitySpontaneityPropertiesSummary +---------+---------------+---------+-----------+----------+-------+ CFV      Full           Yes      Yes                          +---------+---------------+---------+-----------+----------+-------+ FV Prox  Full           Yes      Yes                          +---------+---------------+---------+-----------+----------+-------+ FV Mid   Full           Yes      Yes                          +---------+---------------+---------+-----------+----------+-------+ FV DistalFull           Yes      Yes                          +---------+---------------+---------+-----------+----------+-------+ PFV      Full           Yes      Yes                          +---------+---------------+---------+-----------+----------+-------+ POP      Full           Yes      Yes                          +---------+---------------+---------+-----------+----------+-------+ PTV      Full                                                 +---------+---------------+---------+-----------+----------+-------+ PERO      Full                                                 +---------+---------------+---------+-----------+----------+-------+ GSV      Full                                                 +---------+---------------+---------+-----------+----------+-------+    Final Interpretation: Right: There is no evidence of deep vein thrombosis in the lower extremity. Positive  for an age interminate superficial thrombosis of the greater saphenous vein just distal to the confluence with the saphenofemoral juction. There is no evidence of a Baker's cyst. Left: There is no evidence of deep vein thrombosis in the lower extremity.There is no evidence of superficial venous thrombosis. There is no evidence of a Baker's cyst.  *See table(s) above for measurements and observations. Electronically signed by Deitra Mayo on 08/15/2017 at 4:50:03 PM.    Recent Labs    08/21/17 0618  WBC 6.2  HGB 12.3  HCT 38.3  PLT 196   Recent Labs    08/21/17 0618  NA 132*  K 2.5*  CL 88*  GLUCOSE 132*  BUN 15  CREATININE 0.77  CALCIUM 8.0*   CBG (last 3)  Recent Labs    08/19/17 2137  GLUCAP 90    Wt Readings from Last 3 Encounters:  08/21/17 72.1 kg (158 lb 15.2 oz)  08/12/17 73.8 kg (162 lb 11.2 oz)  07/06/17 76.5 kg (168 lb 11.2 oz)    Physical Exam:  HENT:  Head: Normocephalic.  Nasogastric tube in place.   Eyes:  Pupils round and reactive to light  Neck:  Trach tube in place. #4 with PMV   Cardiac: RRR without murmur. No JVD   Respiratory: clear, no rhonchi or wheezes  GI: Soft. Bowel sounds are normal. She exhibits no distension. There is no tenderness. There is no rebound.  Musculoskeletal: She exhibits no tenderness or deformity.  Neurological:  Sitting up in chair. Makes good eye contact with examiner. Followed full commands. Oriented to name, hospital.  UE 4- to 4/5 prox to distal. LE:   2+ HF, 3- KE and 3+ ADF/PF. No focal sensory deficits --STM deficits Psychiatric:  pleasant     Assessment/Plan: 1.  Functional deficits secondary to debility which require 3+ hours per day of interdisciplinary therapy in a comprehensive inpatient rehab setting. Physiatrist is providing close team supervision and 24 hour management of active medical problems listed below. Physiatrist and rehab team continue to assess barriers to discharge/monitor patient progress toward functional and medical goals.  Function:  Bathing Bathing position Bathing activity did not occur: (per nursing only peri care done, patient refuses bathing ) Position: Shower  Bathing parts Body parts bathed by patient: Right arm, Left arm, Chest, Abdomen, Front perineal area, Right upper leg, Left upper leg Body parts bathed by helper: Buttocks, Right lower leg, Left lower leg, Back  Bathing assist Assist Level: Touching or steadying assistance(Pt > 75%)      Upper Body Dressing/Undressing Upper body dressing   What is the patient wearing?: Pull over shirt/dress     Pull over shirt/dress - Perfomed by patient: Thread/unthread right sleeve, Thread/unthread left sleeve, Put head through opening Pull over shirt/dress - Perfomed by helper: Pull shirt over trunk Button up shirt - Perfomed by patient: Thread/unthread right sleeve, Thread/unthread left sleeve Button up shirt - Perfomed by helper: Thread/unthread right sleeve, Thread/unthread left sleeve, Pull shirt around back    Upper body assist Assist Level: Touching or steadying assistance(Pt > 75%)      Lower Body Dressing/Undressing Lower body dressing   What is the patient wearing?: [employer], Socks       Pants- Performed by helper: Thread/unthread right pants leg, Thread/unthread left pants leg, Pull pants up/down Non-skid slipper socks- Performed by patient: Don/doff left sock Non-skid slipper socks- Performed by helper: Don/doff right sock, Don/doff left sock   Socks - Performed by helper: Don/doff right sock,  Don/doff left sock    Shoes - Performed by helper: Don/doff right shoe, Don/doff left shoe       TED Hose - Performed by helper: Don/doff right TED hose, Don/doff left TED hose  Lower body assist Assist for lower body dressing: Touching or steadying assistance (Pt > 75%)      Toileting Toileting   Toileting steps completed by patient: Performs perineal hygiene Toileting steps completed by helper: Adjust clothing prior to toileting    Toileting assist Assist level: Touching or steadying assistance (Pt.75%)   Transfers Chair/bed transfer   Chair/bed transfer method: Ambulatory Chair/bed transfer assist level: Moderate assist (Pt 50 - 74%/lift or lower) Chair/bed transfer assistive device: Medical sales representative     Max distance: 40 ft  Assist level: Touching or steadying assistance (Pt > 75%)   Wheelchair Wheelchair activity did not occur: Safety/medical concerns Type: Manual   Assist Level: Dependent (Pt equals 0%)  Cognition Comprehension Comprehension assist level: Follows basic conversation/direction with no assist  Expression Expression assist level: Expresses basic 75 - 89% of the time/requires cueing 10 - 24% of the time. Needs helper to occlude trach/needs to repeat words.  Social Interaction Social Interaction assist level: Interacts appropriately 90% of the time - Needs monitoring or encouragement for participation or interaction.  Problem Solving Problem solving assist level: Solves basic 50 - 74% of the time/requires cueing 25 - 49% of the time  Memory Memory assist level: Recognizes or recalls 50 - 74% of the time/requires cueing 25 - 49% of the time  Medical Problem List and Plan:  1. Debility secondary to debility related to abdominal aneurysm/AS with AVR 07/10/2017 status post repair and subsequent cardiogenic shock  -Continue therapies PT, OT, speech therapies 2. DVT Prophylaxis/Anticoagulation: Subcutaneous Lovenox. Dopplers negative 3. Pain Management: Tylenol as  needed  4. Mood: Klonopin 1 mg daily at bedtime, Seroquel 75 mg bedtime, Effexor 37.5 mg twice a day    -Klonopin as needed during the day for anxiety, this appears to be under good control at current time 5. Neuropsych: This patient is capable of making decisions on her own behalf.  6. Skin/Wound Care: Routine skin checks  7. Fluids/Electrolytes/Nutrition: -   BUN slightly elevated but stable.      -dietary intake is poor. now on D3 and thin.    -hypokalemia due to Lasix-severe IV KCL 25meq x2 recheck BMET   -add megace today 8. Tracheostomy 07/25/2017. Downsized to #4 cuffless 08/08/2017. No current plan to decannulate due to failure with capping. Follow-up per Dr. Nelda Marseille.    -Continue nebulizers, trach care and suction   -Guaifenesin to help with secretions   -continue with PMV    -CONTINUE CAPPING TRIALS during day.      -tolerated briefly yesterday 9. Atrial fibrillation. Amiodarone 200 mg daily, ASA 81mg . Cardiac rate controlled  10. Dysphagia. Advanced to D3/thins 11. Acute diastolic congestive heart failure. Lasix 40 mg daily, Aldactone 25 mg daily. Monitor for any signs of fluid overload    -continue current diuretics.  BUN stable --recheck Monday Filed Weights   08/19/17 0500 08/20/17 0752 08/21/17 0441  Weight: 73.2 kg (161 lb 6 oz) 72.4 kg (159 lb 9.8 oz) 72.1 kg (158 lb 15.2 oz)   12. Hypothyroidism. Synthroid      LOS (Days) 9 A FACE TO FACE EVALUATION WAS PERFORMED  Charlett Blake, MD 08/21/2017 8:49 AM

## 2017-08-21 NOTE — Progress Notes (Signed)
Occupational Therapy Session Note  Patient Details  Name: [name] MRN: [id number] Date of Birth: 01/28/1941  Today's Date: 08/21/2017 OT Individual Time: 1100-1200 OT Individual Time Calculation (min): 60 min    Short Term Goals: Week 1:  OT Short Term Goal 1 (Week 1): Pt will recall and utilize sternal pre-cautions during functional tasks with mod A OT Short Term Goal 2 (Week 1): Pt will complete stand pivot transfer to BSC/ toilet with mod A OT Short Term Goal 3 (Week 1): Pt will complete 1/3 toileting tasks with mod steadying assist in order to decrease caregiver burden OT Short Term Goal 4 (Week 1): Pt will don shirt with min A  Skilled Therapeutic Interventions/Progress Updates:    OT intervention with focus on bed mobility, sitting balance, sit<>stand, standing balance, and dressing tasks to increase independence with BADLs.  Pt on humidified air.  O2 sats >90% throughout session.  Pt required assistance with threading LLE and pulling pants over hips.  Pt required min A for sit<>stand and mod A for standing balance 2/2 posterior lean.  Pt able to side step to facilitate position in bed.  Pt required trach care by RN 2/2 some bleeding through trach.  Pt sat EOB at supervision level.  Pt required multiple rest breaks during session.  Pt returned to bed and remained in bed with all needs within reach.   Therapy Documentation Precautions:  Precautions Precautions: Fall, Sternal Precaution Comments: Reinforced sternal precautions Restrictions Weight Bearing Restrictions: (sternal precautions) Other Position/Activity Restrictions: sternal precautions Pain: Pt denies pain  See Function Navigator for Current Functional Status.   Therapy/Group: Individual Therapy  Leroy Libman 08/21/2017, 12:13 PM

## 2017-08-21 NOTE — Plan of Care (Signed)
  Consults [facility] GENERAL PATIENT EDUCATION Description See Patient Education module for education specifics. 08/21/2017 1434 - Progressing by Dietrich Pates, RN   RH SKIN INTEGRITY RH STG SKIN FREE OF INFECTION/BREAKDOWN Description Mod I  08/21/2017 1434 - Progressing by Dietrich Pates, RN   RH SAFETY RH STG ADHERE TO SAFETY PRECAUTIONS W/ASSISTANCE/DEVICE Description STG Adhere to Safety Precautions With Min Assistance/Device.  08/21/2017 1434 - Progressing by Dietrich Pates, RN

## 2017-08-22 ENCOUNTER — Inpatient Hospital Stay (HOSPITAL_COMMUNITY): Payer: Medicare Other

## 2017-08-22 ENCOUNTER — Inpatient Hospital Stay (HOSPITAL_COMMUNITY): Payer: Medicare Other | Admitting: Speech Pathology

## 2017-08-22 ENCOUNTER — Inpatient Hospital Stay (HOSPITAL_COMMUNITY): Payer: Medicare Other | Admitting: Physical Therapy

## 2017-08-22 LAB — BASIC METABOLIC PANEL
ANION GAP: 14 (ref 5–15)
BUN: 13 mg/dL (ref 6–20)
CALCIUM: 8 mg/dL — AB (ref 8.9–10.3)
CHLORIDE: 91 mmol/L — AB (ref 101–111)
CO2: 31 mmol/L (ref 22–32)
Creatinine, Ser: 0.74 mg/dL (ref 0.44–1.00)
GFR calc Af Amer: >60 mL/min (ref >60)
GFR calc non Af Amer: >60 mL/min (ref >60)
GLUCOSE: 98 mg/dL (ref 65–99)
POTASSIUM: 3.2 mmol/L — AB (ref 3.5–5.1)
Sodium: 136 mmol/L (ref 135–145)

## 2017-08-22 MED ORDER — GUAIFENESIN 100 MG/5ML PO SOLN
5.0000 mL | Freq: Three times a day (TID) | ORAL | Status: DC
  Administered 2017-08-22 – 2017-09-02 (×33): 100 mg via ORAL
  Filled 2017-08-22 (×33): qty 5

## 2017-08-22 MED ORDER — POTASSIUM CHLORIDE 20 MEQ PO PACK
20.0000 meq | PACK | Freq: Two times a day (BID) | ORAL | Status: DC
  Administered 2017-08-22 – 2017-08-23 (×4): 20 meq via ORAL
  Filled 2017-08-22 (×5): qty 1

## 2017-08-22 MED ORDER — BUSPIRONE HCL 5 MG PO TABS
5.0000 mg | ORAL_TABLET | Freq: Every day | ORAL | Status: DC
  Administered 2017-08-23 – 2017-08-24 (×2): 5 mg via ORAL
  Filled 2017-08-22 (×2): qty 1

## 2017-08-22 NOTE — Progress Notes (Signed)
Occupational Therapy Weekly Progress Note  Patient Details  Name: [name] MRN: [id number] Date of Birth: [birth year]  Beginning of progress report period: August 13, 2017 End of progress report period: August 22, 2017  Patient has met 3 of 4 short term goals. Pt made steady but slow progress with BADLs during the past week.  Pt requires mod A for bathing tasks and LB dressing tasks.  Pt dons pullover shirts with min A.  Pt requires min A for sit<>stand and max verbal cues to follow sternal precautions.  Pt performs functional transfers with min A/mod A varying with pt's fatigue level.  Pt fatigues easily and requires multiple rest breaks during therapy sessions.  Pt O2 sats >90% on RA during therapy but unable to tolerate capping of trach.   Patient continues to demonstrate the following deficits: muscle weakness, decreased cardiorespiratoy endurance, decreased initiation, decreased attention, decreased awareness, decreased problem solving, decreased safety awareness, decreased memory and delayed processing and decreased standing balance, decreased postural control, decreased balance strategies and difficulty maintaining precautions and therefore will continue to benefit from skilled OT intervention to enhance overall performance with BADL and Reduce care partner burden.  Patient progressing toward long term goals..  Continue plan of care.  OT Short Term Goals Week 1:  OT Short Term Goal 1 (Week 1): Pt will recall and utilize sternal pre-cautions during functional tasks with mod A OT Short Term Goal 1 - Progress (Week 1): Progressing toward goal OT Short Term Goal 2 (Week 1): Pt will complete stand pivot transfer to BSC/ toilet with mod A OT Short Term Goal 2 - Progress (Week 1): Met OT Short Term Goal 3 (Week 1): Pt will complete 1/3 toileting tasks with mod steadying assist in order to decrease caregiver burden OT Short Term Goal 3 - Progress (Week 1): Met OT Short Term Goal 4 (Week  1): Pt will don shirt with min A OT Short Term Goal 4 - Progress (Week 1): Met Week 2:  OT Short Term Goal 1 (Week 2): Pt will recall and utilize sternal pre-cautions during functional tasks with mod A OT Short Term Goal 2 (Week 2): Pt will perform toileting tasks with steady A OT Short Term Goal 3 (Week 2): Pt will perform toilet transfers with steady A OT Short Term Goal 4 (Week 2): Pt will complete LB dressing tasks with min A  Therapy Documentation Precautions:  Precautions Precautions: Fall, Sternal Precaution Comments: Reinforced sternal precautions Restrictions Weight Bearing Restrictions: (sternal precautions) Other Position/Activity Restrictions: sternal precautions  See Function Navigator for Current Functional Status.    [name] 08/22/2017, 2:53 PM

## 2017-08-22 NOTE — Progress Notes (Signed)
When administering patients HS medications, this nurse broke potassium tablet in half and placed in strawberry yogurt per patient request. Patient took half of potassium tablet and began coughing and face turned red. RT at bedside with nurse. Trach collar in place with O2 5LPM administering. O2 sats did not drop below 95% during incident. The other half of potassium tablet was not administered. MD to be notified on am rounds. Patient denies pain/discomfort. Will continue to monitor.

## 2017-08-22 NOTE — Progress Notes (Signed)
Social Work Patient ID: [name], female   DOB: 04/26/1941, 77 y.o.   MRN: [id number]   CSW met with pt and her husband to update them on team conference discussion.  Pt was coughing a lot during visit and felt like she may not be able to do therapy this afternoon as a result.  Pt/husband have seen pt's progress and are encouraged, they just hope that her respiratory status improves.  CSW looked at insurance information husband brought in; it was approval for rehab notice mailed to his home - explained it to him.  CSW will continue to follow and assist as needed.

## 2017-08-22 NOTE — Progress Notes (Signed)
Occupational Therapy Session Note  Patient Details  Name: [name] MRN: [id number] Date of Birth: 11-Mar-1941  Today's Date: 08/22/2017 OT Individual Time: 1100-1200 OT Individual Time Calculation (min): 60 min    Short Term Goals: Week 1:  OT Short Term Goal 1 (Week 1): Pt will recall and utilize sternal pre-cautions during functional tasks with mod A OT Short Term Goal 2 (Week 1): Pt will complete stand pivot transfer to BSC/ toilet with mod A OT Short Term Goal 3 (Week 1): Pt will complete 1/3 toileting tasks with mod steadying assist in order to decrease caregiver burden OT Short Term Goal 4 (Week 1): Pt will don shirt with min A  Skilled Therapeutic Interventions/Progress Updates:    Pt resting in w/c with husband present.  Pt already dressed and agreeable to therapy although she stated the "other therapists" worked her hard.  Initial focus on sit<>stand and standing balance to complete peg board tasks at table.  Pt required max verbal cues for sequencing sit<>stand and min A to perform task.  Pt able to complete simple pattern while standing without rest break.  Pt performed sit<>stand X 5 with min A/mod A and max verbal cues.  Pt transitioned to Day Room and engaged in additional table tasks to increase standing tolerance and address dynamic standing balance.  Pt returned to room and remained in w/c with husband present.  Pt O2 sats>92% on RA throughout session.   Therapy Documentation Precautions:  Precautions Precautions: Fall, Sternal Precaution Comments: Reinforced sternal precautions Restrictions Weight Bearing Restrictions: (sternal precautions) Other Position/Activity Restrictions: sternal precautions Pain:  Pt denies pain  See Function Navigator for Current Functional Status.   Therapy/Group: Individual Therapy  Leroy Libman 08/22/2017, 12:16 PM

## 2017-08-22 NOTE — Progress Notes (Addendum)
Physical Therapy Session Note  Patient Details  Name: [name] MRN: [id number] Date of Birth: [birth year]  Today's Date: 08/22/2017 PT Individual Time: 0900-1000 PT Individual Time Calculation (min): 60 min   Short Term Goals: Week 2:  PT Short Term Goal 1 (Week 2): Pt will ambulate x 5ft using RW with mod assist PT Short Term Goal 2 (Week 2): Pt will perform sit<>stand with consistent minA while maintaining sternal precuations PT Short Term Goal 3 (Week 2): Pt will perform stand pivot transfer with min assist and use of RW  Skilled Therapeutic Interventions/Progress Updates:    Pt greeted supine in bed, reports did not sleep well last night but agreeable to therapy. Pt transitions supine to sit with HOB elevated with min guard for balance, performs sit>stand from elevated bed using RW with minA, stand pivot transfer to W/C using RW with min guard and vcing for completing turn. Pt transported to therapy gym with totalA d/t precautions, ambulates up/down 8 3" steps using B handrails, reciprocal pattern with min guard ascending, requires seated rest break at top of stairs, step to pattern with minA for balance descending. Pt performs 5 x sit<>stand from mat emphasizing BLE endurance and technique, pt unable to complete >3 sit<>stands without rest break. Pt performs passive heel cord stretch in standing with toes elevated on wedge 3 x 20seconds while completing vertical puzzle, vcing to extend hips in standing, pt unable to correctly assemble puzzle. Pt performs 5 minutes on nustep level 1 with improved ability to pedal with LLE. At end of session, pt left in bathroom with nursing staff, completed memory book entry, denies any needs at this time.   Therapy Documentation Precautions:  Precautions Precautions: Fall, Sternal Precaution Comments: Reinforced sternal precautions Restrictions Weight Bearing Restrictions: (sternal precautions) Other Position/Activity Restrictions: sternal  precautions Vital Signs: Therapy Vitals Temp: 98.3 F (36.8 C) Temp Source: Axillary Pulse Rate: 93 Resp: 20 BP: 124/66 Patient Position (if appropriate): Sitting Oxygen Therapy SpO2: 97 %(PMSV) O2 Device: Not Delivered   See Function Navigator for Current Functional Status.   Therapy/Group: Individual Therapy  Salvatore Decent 08/22/2017, 9:55 AM

## 2017-08-22 NOTE — Patient Care Conference (Signed)
Inpatient RehabilitationTeam Conference and Plan of Care Update Date: 08/22/2017   Time: 10:15 AM    Patient Name: [name]      Medical Record Number: [id number]  Date of Birth: March 24, 1941 Sex: Female         Room/Bed: 4W18C/4W18C-01 Payor Info: Payor: [place] / Plan: [employer] MEDICARE / Product Type: *No Product type* /    Admitting Diagnosis: debility  Admit Date/Time:  08/12/2017  4:11 PM Admission Comments: No comment available   Primary Diagnosis:  <principal problem not specified> Principal Problem: <principal problem not specified>  Patient Active Problem List   Diagnosis Date Noted  . Debility 08/12/2017  . PAF (paroxysmal atrial fibrillation) ([place])   . Pressure injury of skin 08/04/2017  . Tracheostomy in place [facility])   . Acute encephalopathy   . HCAP (healthcare-associated pneumonia)   . Acute respiratory failure ([place])   . RVF (right ventricular failure) ([place])   . RVAD (right ventricular assist device) present ([place])   . Cardiogenic shock ([place])   . S/P AVR 07/10/2017  . Nephrolithiasis 04/07/2017  . Vitamin B 12 deficiency 03/01/2016  . Ascending aortic aneurysm ([place]) 08/20/2015  . Thyroid cancer ([place]) 07/30/2015  . Left thyroid nodule 07/27/2015  . Osteoporosis with fracture 04/16/2014  . At high risk for falls 04/16/2014  . Proximal humerus fracture 05/30/2011  . CHEST PAIN, PRECORDIAL 02/26/2010  . Hyperlipidemia 02/23/2010  . Depression 02/23/2010  . GLAUCOMA 02/23/2010  . CATARACTS 02/23/2010  . RHEUMATIC FEVER 02/23/2010  . Essential hypertension 02/23/2010  . MITRAL VALVE PROLAPSE 02/23/2010  . GERD 02/23/2010  . Osteoarthritis 02/23/2010  . Primary fibromyalgia syndrome 02/23/2010    Expected Discharge Date: Expected Discharge Date: 09/02/17  Team Members Present: Physician leading conference: Dr. Delice Lesch Social Worker Present: Alfonse Alpers, [name] Nurse Present: Verdis Frederickson, RN PT Present: Kem Parkinson,  PT OT Present: Roanna Epley, [place], OT SLP Present: Charolett Bumpers, SLP     Current Status/Progress Goal Weekly Team Focus  Medical   anxiety regarding trach capping  decannulate prior to d/c   reduce anxiety during trach capping trials   Bowel/Bladder   Continent of bowel/bladder with urgency. LBM 08/21/17  to remain continent b/b  assess bowel/bladder function q shift and PRN   Swallow/Nutrition/ Hydration   dys 3 and thin   Supervision A  trials of regular    ADL's   bathing-mod A; UB dressing-supervision- LB dressing mod A; functional transfers-mod A; limited awareness of deficits  supervision overall  BADL retraining, functional transfers, standing balance, cognitive remediation, family educaiton   Mobility   min/mod bed mobility and sit<>stand, minA gait up to 49ft  S overall, minA bed mobility and stairs  activity tolerance, BLE flexibility and strengthening, transfer/gait training   Communication             Safety/Cognition/ Behavioral Observations  Max A recall and use of PMSV, Mod A basic problem solving, Min A selective attention  MIn A, Supervision A  using PMSV, recall precautions, selective attention and basic problem solving   Pain   denies pain  <2  assess and treat pain qshift and PRN   Skin   sugical site to chest OTA, multiple ST to BLE with thin film dsg, suture to groin, Gerards butt cream BID  breakdown/infection free with min assist  assess skin q shift and PRN    Rehab Goals Patient on target to meet rehab goals: Yes Rehab Goals Revised: none *  See Care Plan and progress notes for long and short-term goals.     Barriers to Discharge  Current Status/Progress Possible Resolutions Date Resolved   Physician    Medical stability;Trach  see above  progressing toward goals, no medical set backs  trial Buspar during the day given Klonopin too sedating      Nursing                  PT                    OT                  SLP  Trach difficulty with capping trials            SW                Discharge Planning/Teaching Needs:  Pt plans to return to her home where her husband and other family members will provide supervision/min A as needed.  Husband can come for family education closer to d/c.   Team Discussion:  Pt with increased anxiety, especially with trials of trach capping.  Pt was on Klonopin, but it has been changed to buspar, as the Klonopin was too sedating.  Pt's potassium is low - supplement started, as well as Megace for appetite.  Pt on D3 diet with thin liquids and trials of regular textures.  Pt not tolerating capping trials.  ST started a memory book for precautions, capping, use of PMV.  Pt's attention and problem solving better, all other ST tasks are max assist.  Pt is mod A level overall but supervision to min A for UB tasks; min to mod LB tasks.  Her sequencing and sit to stands are better.  Pt still with lean, per OT.  Pt is min guard bed mobility with head of bed elevated.  Min to mod A for sit to stand.  Pt can't remember precautions.  Pt started stairs today.  RN also concerned of pt not tolerating capping and that pt's secretions are thicker.  Liquid medicine goes does better, but burns her throat.   Revisions to Treatment Plan:  none    Continued Need for Acute Rehabilitation Level of Care: The patient requires daily medical management by a physician with specialized training in physical medicine and rehabilitation for the following conditions: Daily direction of a multidisciplinary physical rehabilitation program to ensure safe treatment while eliciting the highest outcome that is of practical value to the patient.: Yes Daily medical management of patient stability for increased activity during participation in an intensive rehabilitation regime.: Yes Daily analysis of laboratory values and/or radiology reports with any subsequent need for medication adjustment of medical intervention for :  Pulmonary problems;Other;Mood/behavior problems  Tarence Searcy, Silvestre Mesi 08/22/2017, 12:34 PM

## 2017-08-22 NOTE — Progress Notes (Signed)
[place] PHYSICAL MEDICINE & REHABILITATION     PROGRESS NOTE    Subjective/Complaints: Patient tolerates Passy-Muir valve but not trach capping.  ROS: pt denies nausea, vomiting, diarrhea, cough, shortness of breath or chest pain   Objective: Vital Signs:  Blood pressure 124/66, pulse 93, temperature 98.3 F (36.8 C), temperature source Axillary, resp. rate 20, height 5\' 5"  (1.651 m), weight 72.3 kg (159 lb 6.3 oz), SpO2 97 %. Vas [place] Lower Extremity Venous (dvt)  Result Date: 08/15/2017  Lower Venous Study Indication: Edema. Risk Factors: Surgery aortic aneurysm repair. Examination Guidelines: A complete evaluation includes B-mode imaging, spectral doppler, color doppler, and power doppler as needed of all accessible portions of each vessel. Bilateral testing is considered an integral part of a complete examination. Limited examinations for reoccurring indications may be performed as noted. The reflux portion of the exam is performed with the patient in reverse Trendelenburg.  Right Venous Findings: +---------+---------------+---------+-----------+----------+-----------------+          CompressibilityPhasicitySpontaneityPropertiesSummary           +---------+---------------+---------+-----------+----------+-----------------+ CFV      Full           Yes      Yes                                    +---------+---------------+---------+-----------+----------+-----------------+ FV Prox  Full           Yes      Yes                                    +---------+---------------+---------+-----------+----------+-----------------+ FV Mid   Full           Yes      Yes                                    +---------+---------------+---------+-----------+----------+-----------------+ FV DistalFull           Yes      Yes                                    +---------+---------------+---------+-----------+----------+-----------------+ PFV      Full           Yes      Yes                                     +---------+---------------+---------+-----------+----------+-----------------+ POP      Full           Yes      Yes                                    +---------+---------------+---------+-----------+----------+-----------------+ PTV      Full                                                           +---------+---------------+---------+-----------+----------+-----------------+ PERO  Full                                                           +---------+---------------+---------+-----------+----------+-----------------+ GSV      Partial                                      Age Indeterminate +---------+---------------+---------+-----------+----------+-----------------+  Left Venous Findings: +---------+---------------+---------+-----------+----------+-------+          CompressibilityPhasicitySpontaneityPropertiesSummary +---------+---------------+---------+-----------+----------+-------+ CFV      Full           Yes      Yes                          +---------+---------------+---------+-----------+----------+-------+ FV Prox  Full           Yes      Yes                          +---------+---------------+---------+-----------+----------+-------+ FV Mid   Full           Yes      Yes                          +---------+---------------+---------+-----------+----------+-------+ FV DistalFull           Yes      Yes                          +---------+---------------+---------+-----------+----------+-------+ PFV      Full           Yes      Yes                          +---------+---------------+---------+-----------+----------+-------+ POP      Full           Yes      Yes                          +---------+---------------+---------+-----------+----------+-------+ PTV      Full                                                 +---------+---------------+---------+-----------+----------+-------+ PERO      Full                                                 +---------+---------------+---------+-----------+----------+-------+ GSV      Full                                                 +---------+---------------+---------+-----------+----------+-------+    Final Interpretation: Right: There is no evidence of deep vein thrombosis in the lower extremity. Positive  for an age interminate superficial thrombosis of the greater saphenous vein just distal to the confluence with the saphenofemoral juction. There is no evidence of a Baker's cyst. Left: There is no evidence of deep vein thrombosis in the lower extremity.There is no evidence of superficial venous thrombosis. There is no evidence of a Baker's cyst.  *See table(s) above for measurements and observations. Electronically signed by Deitra Mayo on 08/15/2017 at 4:50:03 PM.    Recent Labs    08/21/17 0618  WBC 6.2  HGB 12.3  HCT 38.3  PLT 196   Recent Labs    08/21/17 1455 08/22/17 0744  NA 133* 136  K 3.1* 3.2*  CL 88* 91*  GLUCOSE 93 98  BUN 15 13  CREATININE 0.77 0.74  CALCIUM 8.0* 8.0*   CBG (last 3)  Recent Labs    08/19/17 2137  GLUCAP 90    Wt Readings from Last 3 Encounters:  08/22/17 72.3 kg (159 lb 6.3 oz)  08/12/17 73.8 kg (162 lb 11.2 oz)  07/06/17 76.5 kg (168 lb 11.2 oz)    Physical Exam:  HENT:  Head: Normocephalic.  Nasogastric tube in place.   Eyes:  Pupils round and reactive to light  Neck:  Trach tube in place. #4 with PMV   Cardiac: RRR without murmur. No JVD   Respiratory: clear, no rhonchi or wheezes  GI: Soft. Bowel sounds are normal. She exhibits no distension. There is no tenderness. There is no rebound.  Musculoskeletal: She exhibits no tenderness or deformity.  Neurological:  Sitting up in chair. Makes good eye contact with examiner. Followed full commands. Oriented to name, hospital.  UE 4- to 4/5 prox to distal. LE:   2+ HF, 3- KE and 3+ ADF/PF. No focal sensory deficits  --STM deficits Psychiatric: pleasant     Assessment/Plan: 1.  Functional deficits secondary to debility which require 3+ hours per day of interdisciplinary therapy in a comprehensive inpatient rehab setting. Physiatrist is providing close team supervision and 24 hour management of active medical problems listed below. Physiatrist and rehab team continue to assess barriers to discharge/monitor patient progress toward functional and medical goals.  Function:  Bathing Bathing position Bathing activity did not occur: (per nursing only peri care done, patient refuses bathing ) Position: Shower  Bathing parts Body parts bathed by patient: Right arm, Left arm, Chest, Abdomen, Front perineal area, Right upper leg, Left upper leg Body parts bathed by helper: Buttocks, Right lower leg, Left lower leg, Back  Bathing assist Assist Level: Touching or steadying assistance(Pt > 75%)      Upper Body Dressing/Undressing Upper body dressing   What is the patient wearing?: Pull over shirt/dress     Pull over shirt/dress - Perfomed by patient: Thread/unthread right sleeve, Thread/unthread left sleeve, Put head through opening Pull over shirt/dress - Perfomed by helper: Pull shirt over trunk Button up shirt - Perfomed by patient: Thread/unthread right sleeve, Thread/unthread left sleeve Button up shirt - Perfomed by helper: Thread/unthread right sleeve, Thread/unthread left sleeve, Pull shirt around back    Upper body assist Assist Level: Touching or steadying assistance(Pt > 75%)      Lower Body Dressing/Undressing Lower body dressing   What is the patient wearing?: [employer], Socks       Pants- Performed by helper: Thread/unthread right pants leg, Thread/unthread left pants leg, Pull pants up/down Non-skid slipper socks- Performed by patient: Don/doff left sock Non-skid slipper socks- Performed by helper: Don/doff right sock, Don/doff left sock  Socks - Performed by helper: Don/doff right  sock, Don/doff left sock   Shoes - Performed by helper: Don/doff right shoe, Don/doff left shoe       TED Hose - Performed by helper: Don/doff right TED hose, Don/doff left TED hose  Lower body assist Assist for lower body dressing: Touching or steadying assistance (Pt > 75%)      Toileting Toileting   Toileting steps completed by patient: Performs perineal hygiene Toileting steps completed by helper: Adjust clothing prior to toileting    Toileting assist Assist level: Touching or steadying assistance (Pt.75%)   Transfers Chair/bed transfer   Chair/bed transfer method: Ambulatory Chair/bed transfer assist level: Moderate assist (Pt 50 - 74%/lift or lower) Chair/bed transfer assistive device: Medical sales representative     Max distance: 40 ft  Assist level: Touching or steadying assistance (Pt > 75%)   Wheelchair Wheelchair activity did not occur: Safety/medical concerns Type: Manual   Assist Level: Dependent (Pt equals 0%)  Cognition Comprehension Comprehension assist level: Follows basic conversation/direction with no assist  Expression Expression assist level: Expresses basic 90% of the time/requires cueing < 10% of the time., Expresses basic needs/ideas: With extra time/assistive device  Social Interaction Social Interaction assist level: Interacts appropriately 90% of the time - Needs monitoring or encouragement for participation or interaction.  Problem Solving Problem solving assist level: Solves basic 50 - 74% of the time/requires cueing 25 - 49% of the time  Memory Memory assist level: Recognizes or recalls 50 - 74% of the time/requires cueing 25 - 49% of the time  Medical Problem List and Plan:  1. Debility secondary to debility related to abdominal aneurysm/AS with AVR 07/10/2017 status post repair and subsequent cardiogenic shock  -Continue therapies PT, OT, speech therapies, team conference today 2. DVT Prophylaxis/Anticoagulation: Subcutaneous Lovenox.  Dopplers negative 3. Pain Management: Tylenol as needed  4. Mood: Klonopin 1 mg daily at bedtime, Seroquel 75 mg bedtime, Effexor 37.5 mg twice a day Klonopin too sedating during the day, will try BuSpar   -Klonopin as needed during the day for anxiety, this appears to be under good control at current time 5. Neuropsych: This patient is capable of making decisions on her own behalf.  6. Skin/Wound Care: Routine skin checks  7. Fluids/Electrolytes/Nutrition: -   BUN slightly elevated but stable.      -dietary intake is poor. now on D3 and thin.    -hypokalemia due to Lasix-severe IV KCL 10meq x2 recheck BMET   -add megace today 8. Tracheostomy 07/25/2017. Downsized to #4 cuffless 08/08/2017. No current plan to decannulate due to failure with capping. Follow-up per Dr. Nelda Marseille.    -Continue nebulizers, trach care and suction   -Guaifenesin to help with secretions   -continue with PMV    -CONTINUE CAPPING TRIALS during day.  Anxiety is an issue    -tolerated briefly yesterday 9. Atrial fibrillation. Amiodarone 200 mg daily, ASA 81mg . Cardiac rate controlled  10. Dysphagia. Advanced to D3/thins 11. Acute diastolic congestive heart failure. Lasix 40 mg daily, Aldactone 25 mg daily. Monitor for any signs of fluid overload    -continue current diuretics.  BUN stable --recheck Monday Filed Weights   08/20/17 0752 08/21/17 0441 08/22/17 0600  Weight: 72.4 kg (159 lb 9.8 oz) 72.1 kg (158 lb 15.2 oz) 72.3 kg (159 lb 6.3 oz)   12. Hypothyroidism. Synthroid      LOS (Days) 10 A FACE TO FACE EVALUATION WAS PERFORMED  Charlett Blake,  MD 08/22/2017 9:04 AM

## 2017-08-22 NOTE — Progress Notes (Signed)
RN capped trach this morning for 10 minutes with pt unable to tolerate and oxygen dropping to 88%. PMSV placed with oxygen 97%. Unable to tolerate capping trach at this time.

## 2017-08-22 NOTE — Progress Notes (Signed)
Physical Therapy Session Note  Patient Details  Name: [name] MRN: [id number] Date of Birth: Nov 08, 1940  Today's Date: 08/22/2017 PT Missed Time: 60 Minutes Missed Time Reason: Patient unwilling to participate;Patient ill (Comment)(feeling sick, increased secretions and coughing)  Pt reports increased coughing and secretions, overall ill-feeling and ill-appearing. Pt refusing therapy this afternoon. Missed 60 min of skilled PT.   Macalister Arnaud K Arnette 08/22/2017, 1:21 PM

## 2017-08-22 NOTE — Progress Notes (Signed)
Speech Language Pathology Daily Session Note  Patient Details  Name: [name] MRN: [id number] Date of Birth: 1941-06-06  Today's Date: 08/22/2017 SLP Individual Time: 8937-3428 SLP Individual Time Calculation (min): 30 min  Short Term Goals: Week 2: SLP Short Term Goal 1 (Week 2): Pt will donn and doff PMSV with Max A cues.  SLP Short Term Goal 2 (Week 2): Pt will utilize external memory aid to recall basic information with Max A cues (such as swallowing/diet recommendation information).  SLP Short Term Goal 3 (Week 2): Pt will demonstrate basic problem solving with familiar tasks and Min A cues.  SLP Short Term Goal 4 (Week 2): Pt will demonstrate selective attention for ~ 30 minutes with supervision cues.  SLP Short Term Goal 5 (Week 2): Pt will consume regular diet with thin liquids without overt s/s of aspiration with supervision cues for use of compensatory swallow strategies.   Skilled Therapeutic Interventions:  Pt was seen for skilled ST targeting patient and education.  SLP provided skilled education regarding PMSV, including rationale for and parameters of device use.  Pt needed mod cues to recall that she should not wear valve while sleeping at night.  She was able to return demonstration of donning and doffing valve x5 with supervision.  SLP also demonstrated how to clean valve as it was noted to be dirty.  Valve left off pt to dry and RN made aware.  Pt was left in bed with bed alarm set and call bell within reach.    Function:  Eating Eating     Eating Assist Level: Set up assist for   Eating Set Up Assist For: Opening containers       Cognition Comprehension Comprehension assist level: Follows basic conversation/direction with no assist  Expression Expression assistive device: Talk trach valve Expression assist level: Expresses basic needs/ideas: With extra time/assistive device  Social Interaction Social Interaction assist level: Interacts appropriately 90% of  the time - Needs monitoring or encouragement for participation or interaction.  Problem Solving Problem solving assist level: Solves basic 50 - 74% of the time/requires cueing 25 - 49% of the time  Memory Memory assist level: Recognizes or recalls 50 - 74% of the time/requires cueing 25 - 49% of the time    Pain Pain Assessment Pain Assessment: No/denies pain  Therapy/Group: Individual Therapy  Cydney Alvarenga, Selinda Orion 08/22/2017, 3:59 PM

## 2017-08-23 ENCOUNTER — Inpatient Hospital Stay (HOSPITAL_COMMUNITY): Payer: Medicare Other | Admitting: Speech Pathology

## 2017-08-23 ENCOUNTER — Encounter (HOSPITAL_COMMUNITY): Payer: Medicare Other | Admitting: Psychology

## 2017-08-23 ENCOUNTER — Inpatient Hospital Stay (HOSPITAL_COMMUNITY): Payer: Medicare Other

## 2017-08-23 ENCOUNTER — Inpatient Hospital Stay (HOSPITAL_COMMUNITY): Payer: Medicare Other | Admitting: Physical Therapy

## 2017-08-23 ENCOUNTER — Inpatient Hospital Stay (HOSPITAL_COMMUNITY): Payer: Medicare Other | Admitting: *Deleted

## 2017-08-23 DIAGNOSIS — F411 Generalized anxiety disorder: Secondary | ICD-10-CM

## 2017-08-23 DIAGNOSIS — F329 Major depressive disorder, single episode, unspecified: Secondary | ICD-10-CM

## 2017-08-23 DIAGNOSIS — I5031 Acute diastolic (congestive) heart failure: Secondary | ICD-10-CM

## 2017-08-23 DIAGNOSIS — F419 Anxiety disorder, unspecified: Secondary | ICD-10-CM

## 2017-08-23 DIAGNOSIS — I5030 Unspecified diastolic (congestive) heart failure: Secondary | ICD-10-CM

## 2017-08-23 DIAGNOSIS — R1310 Dysphagia, unspecified: Secondary | ICD-10-CM

## 2017-08-23 DIAGNOSIS — F32A Depression, unspecified: Secondary | ICD-10-CM

## 2017-08-23 NOTE — Consult Note (Signed)
Neuropsychological Consultation   Patient:   [name]   DOB:   04/19/1941  MR Number:  [id number]  Location:  [facility] [id number] [place] [address] Dept: [place]: [phone]           Date of Service:   08/23/2017  Start Time:   8 AM End Time:   9 AM  Provider/Observer:  Ilean Skill, Psy.D.       Clinical Neuropsychologist       Billing Code/Service: [phone] 4 Units  Chief Complaint:    [name] is a 77 year old female with prior history of cardiovascular issues followed for issues of fusiform ascending aneurysm.  Presented 07/09/2017 with increasing size of aneurysm and had repair as well as aortic valve replacement.  Patient has had extended hospital course.  Patient also has long-standing issues with anxiety and some depression in the past that has been continuing with hospital course.    Reason for Service:  Mrs. Dinkel was referred for neuropsychological/psychological consultation due to coping and anxiety issues along with her physical weakness.  Below is the HPI for the current admission.    HPI: [name] is a 77 y.o. right handed female who is been followed by cardiothoracic surgery for a fusiform ascending aneurysm which has increased in size over the past 1-2 years with associated moderate aortic insufficiency. Per chart review patient lives with spouse. Independent with assistive device using a cane prior to admission. One level home 4 steps to entry. Spouse can assist as needed. Presented 07/09/2017 due to increasing size of a ascending aneurysm and underwent repair of ascending aneurysm as well as aortic valve replacement 07/10/2017 per Dr. Nils Pyle. Postoperative ventilatory support. Findings a small amount of anterior mediastinal hematoma as well as orthostasis requiring pressor support. Her urine output decreased concern for tamponade and return back  to the operating room for reexploration to remove any blood/hematoma 07/11/2017 as well as findings RV function significantly decreased from earlier postoperative status and underwent implantation of percutaneous right ventricular assist device. Hospital course ventilatory support underwent tracheostomy 07/25/2017 per Dr. Nelda Marseille and has since been down sized to a #4 cuff less 08/08/2017 and tolerating PMV. No current plan to decannulate as did not tolerate capping trial. Hospital course acute blood loss anemia. Bouts of atrial fibrillation followed by cardiology services and currently maintained on amiodarone. Currently maintained on subcutaneous Lovenox for DVT prophylaxis. Bouts of hypokalemia supplement added. Dysphagia #1 pudding liquids and nasogastric tube feeds for nutritional support. Physical as well as occupational therapy evaluations completed 07/19/2017. Request made for physical medicine rehabilitation consult. Patient was admitted for a comprehensive rehabilitation program   Current Status:  The patient reports that she is feeling better about her medical status now that she is doing better from her surgical interventions.  She reports that anxiety is still an issues.  She reports that she will wake up at night and not know what room she is in initially.  She reports that having people around helps and likes to keep her door open to see people around the room in the hall way.    Behavioral Observation: [name]  presents as a 77 y.o.-year-old Right Caucasian Female who appeared her stated age. her dress was Appropriate and she was Well Groomed and her manners were Appropriate to the situation.  her participation was indicative of Appropriate and Attentive behaviors.  There  were any physical disabilities noted.  she displayed an appropriate level of cooperation and motivation.     Interactions:    Active Appropriate and Attentive  Attention:   within normal limits and attention span and  concentration were age appropriate  Memory:   abnormal; remote memory intact, recent memory mild impairments.  She does use a memory book as memory cue.  Visuo-spatial:  within normal limits  Speech (Volume):  low  Speech:   normal; The patient has a trac  Thought Process:  Coherent and Relevant  Though Content:  WNL; not suicidal and not homicidal  Orientation:   person, place, time/date and situation  Judgment:   Good  Planning:   Good  Affect:    Anxious  Mood:    Anxious  Insight:   Good  Intelligence:   normal  Marital Status/Living: Patient is married and husband still works part time job to get out of house and stay busy.   Medical History:   Past Medical History:  Diagnosis Date  . ACL tear    right  Dr. Gladstone Pih   . Adenomatous polyp   . Anxiety   . Aorta aneurysm ([place])   . Arthritis   . Ascending aortic aneurysm ([place])    note per chart per Dr Lucianne Lei Tright 4.7 cm 04/15/2015   . B12 deficiency   . Cancer ([place])   . Cataracts, both eyes 10/2006  . Chronic bronchitis ([place])   . Constipation   . Depression   . DUB (dysfunctional uterine bleeding) 10/96  . ETD (eustachian tube dysfunction)   . Fall   . Fibromyalgia   . GERD (gastroesophageal reflux disease)   . Glaucoma    ([facility]) Dr. Arnoldo Morale   . Glaucoma    bilaterally  . Helicobacter pylori (H. pylori)   . Hiatal hernia   . History of bronchitis   . History of frequent urinary tract infections   . History of kidney stones   . History of left shoulder fracture    pt states fell off bed and broke ball in shoulder had rod placed   . Hyperlipidemia   . Hypertension   . Hypothyroidism   . Insomnia   . Leg wound, left    healed   . MVP (mitral valve prolapse)   . Occasional tremors    left arm   . Osteoarthritis   . Osteopenia   . Osteoporosis   . Other and unspecified hyperlipidemia   . Post-menopausal   . Thyroid cancer ([place])   . Thyroid nodule   . Tremor    Psychiatric History:  Patient has  long history of anxiety and some issues with depression.  Family Med/Psych History:  Family History  Problem Relation Age of Onset  . Cancer Mother        PANCREATIC   . Osteoporosis Mother   . Hip fracture Mother   . Heart attack Father 63       Died 68  . Heart disease Father   . Arthritis Father   . Hypertension Sister   . Cancer Sister        skin  . Hypertension Sister   . Breast cancer Neg Hx     Risk of Suicide/Violence: low Patient denies SI or HI.    Impression/DX:  Kalisi Bevill is a 77 year old female with prior history of cardiovascular issues followed for issues of fusiform ascending aneurysm.  Presented 07/09/2017 with increasing size of aneurysm and had repair  as well as aortic valve replacement.  Patient has had extended hospital course.  Patient also has long-standing issues with anxiety and some depression in the past that has been continuing with hospital course.   The patient reports that she is feeling better about her medical status now that she is doing better from her surgical interventions.  She reports that anxiety is still an issues.  She reports that she will wake up at night and not know what room she is in initially.  She reports that having people around helps and likes to keep her door open to see people around the room in the hall way.    Disposition/Plan:  Will see again first of next week for follow-up due to anxiety.  Diagnosis:    Debility         Electronically Signed   _______________________ Ilean Skill, Psy.D.

## 2017-08-23 NOTE — Progress Notes (Signed)
Occupational Therapy Session Note  Patient Details  Name: [name] MRN: [id number] Date of Birth: [birth year]  Today's Date: 08/23/2017 OT Individual Time: 1000-1100 OT Individual Time Calculation (min): 60 min    Short Term Goals: Week 2:  OT Short Term Goal 1 (Week 2): Pt will recall and utilize sternal pre-cautions during functional tasks with mod A OT Short Term Goal 2 (Week 2): Pt will perform toileting tasks with steady A OT Short Term Goal 3 (Week 2): Pt will perform toilet transfers with steady A OT Short Term Goal 4 (Week 2): Pt will complete LB dressing tasks with min A  Skilled Therapeutic Interventions/Progress Updates:    Pt resting in bed upon arrival and agreeable to therapy.  Pt sat EOB at supervision level and required min A for sit<>stand from EOB.  Pt requested to use toilet and amb with RW to bathroom with steady A.  Pt required assistance pulling up pants.  Pt transitioned to gym for dynamic standing balance tasks and functional amb with RW with use of reacher to gather items from floor.  O2 sats>93% throughout session.  Pt required multiple rest breaks during session.  Pt returned to room and remained in w/c with husband present.   Therapy Documentation Precautions:  Precautions Precautions: Fall, Sternal Precaution Comments: Reinforced sternal precautions Restrictions Weight Bearing Restrictions: (sternal precautions) Other Position/Activity Restrictions: sternal precautions Pain: Pain Assessment Pain Assessment: No/denies pain   Other Treatments:    See Function Navigator for Current Functional Status.   Therapy/Group: Individual Therapy  Leroy Libman 08/23/2017, 12:21 PM

## 2017-08-23 NOTE — Progress Notes (Addendum)
Physical Therapy Note  Patient Details  Name: NICKIE WARWICK MRN: [id number] Date of Birth: 08/17/1940 Today's Date: 08/23/2017  1405-1435, 30 min individual tx Pain: none per pt  Pt stated she was very tired, but willing to participate.  Supine at 30 degrees HOB,  10 x 1 R/L straight leg raises, bil bridging 20 x 1 bil ankle pumps, bil hip internal rotation.  neuromuscular re-education via positioning and multimodal cues for sit> stand from raised bed to stand on red wedge for sustained stretch bil heel cords and hamstrings, and challenge to balance.  Pt had immediate LOB backwards and required mod assist for balance with bil UE support x 1 min bout.  O2 sats remained above 90%, and HR 101.   Sit> stand 2nd bout with resulting O2 sats dropping to 78%; pt had to sit.  Cued pt to breathe while standing up, and sats quickly > 95%.  3rd bout, pt tolerated standing on wedge x 2 minutes with mod assist, bil UE support fading to 0UE support briefly. Pt sat EOB, cued for decreasing posterior pelvic tilt with good results.  Pt passed off to [place], [place] for next session.  See function navigator for current status.  Kiahna Banghart 08/23/2017, 12:26 PM

## 2017-08-23 NOTE — Progress Notes (Addendum)
[facility] PHYSICAL MEDICINE & REHABILITATION     PROGRESS NOTE    Subjective/Complaints: Pt seen sitting up in bed this AM.  She slept well overnight.  She notes DOE.  Tolerating PMV.  ROS: +DOE. Denies nausea, vomiting, diarrhea, shortness of breath or chest pain   Objective: Vital Signs:  Blood pressure (!) 114/93, pulse 88, temperature 98.1 F (36.7 C), temperature source Oral, resp. rate 18, height 5\' 5"  (1.651 m), weight 71 kg (156 lb 8.4 oz), SpO2 97 %. Vas [place] Lower Extremity Venous (dvt)  Result Date: 08/15/2017  Lower Venous Study Indication: Edema. Risk Factors: Surgery aortic aneurysm repair. Examination Guidelines: A complete evaluation includes B-mode imaging, spectral doppler, color doppler, and power doppler as needed of all accessible portions of each vessel. Bilateral testing is considered an integral part of a complete examination. Limited examinations for reoccurring indications may be performed as noted. The reflux portion of the exam is performed with the patient in reverse Trendelenburg.  Right Venous Findings: +---------+---------------+---------+-----------+----------+-----------------+          CompressibilityPhasicitySpontaneityPropertiesSummary           +---------+---------------+---------+-----------+----------+-----------------+ CFV      Full           Yes      Yes                                    +---------+---------------+---------+-----------+----------+-----------------+ FV Prox  Full           Yes      Yes                                    +---------+---------------+---------+-----------+----------+-----------------+ FV Mid   Full           Yes      Yes                                    +---------+---------------+---------+-----------+----------+-----------------+ FV DistalFull           Yes      Yes                                    +---------+---------------+---------+-----------+----------+-----------------+ PFV       Full           Yes      Yes                                    +---------+---------------+---------+-----------+----------+-----------------+ POP      Full           Yes      Yes                                    +---------+---------------+---------+-----------+----------+-----------------+ PTV      Full                                                           +---------+---------------+---------+-----------+----------+-----------------+  PERO     Full                                                           +---------+---------------+---------+-----------+----------+-----------------+ GSV      Partial                                      Age Indeterminate +---------+---------------+---------+-----------+----------+-----------------+  Left Venous Findings: +---------+---------------+---------+-----------+----------+-------+          CompressibilityPhasicitySpontaneityPropertiesSummary +---------+---------------+---------+-----------+----------+-------+ CFV      Full           Yes      Yes                          +---------+---------------+---------+-----------+----------+-------+ FV Prox  Full           Yes      Yes                          +---------+---------------+---------+-----------+----------+-------+ FV Mid   Full           Yes      Yes                          +---------+---------------+---------+-----------+----------+-------+ FV DistalFull           Yes      Yes                          +---------+---------------+---------+-----------+----------+-------+ PFV      Full           Yes      Yes                          +---------+---------------+---------+-----------+----------+-------+ POP      Full           Yes      Yes                          +---------+---------------+---------+-----------+----------+-------+ PTV      Full                                                  +---------+---------------+---------+-----------+----------+-------+ PERO     Full                                                 +---------+---------------+---------+-----------+----------+-------+ GSV      Full                                                 +---------+---------------+---------+-----------+----------+-------+    Final Interpretation: Right: There is no evidence of deep vein thrombosis  in the lower extremity. Positive for an age interminate superficial thrombosis of the greater saphenous vein just distal to the confluence with the saphenofemoral juction. There is no evidence of a Baker's cyst. Left: There is no evidence of deep vein thrombosis in the lower extremity.There is no evidence of superficial venous thrombosis. There is no evidence of a Baker's cyst.  *See table(s) above for measurements and observations. Electronically signed by Deitra Mayo on 08/15/2017 at 4:50:03 PM.    Recent Labs    08/21/17 0618  WBC 6.2  HGB 12.3  HCT 38.3  PLT 196   Recent Labs    08/21/17 1455 08/22/17 0744  NA 133* 136  K 3.1* 3.2*  CL 88* 91*  GLUCOSE 93 98  BUN 15 13  CREATININE 0.77 0.74  CALCIUM 8.0* 8.0*   CBG (last 3)  No results for input(s): GLUCAP in the last 72 hours.  Wt Readings from Last 3 Encounters:  08/23/17 71 kg (156 lb 8.4 oz)  08/12/17 73.8 kg (162 lb 11.2 oz)  07/06/17 76.5 kg (168 lb 11.2 oz)    Physical Exam:  HENT: Normocephalic. Atraumatic.  Eyes: EOMI. No dischage.  Neck: Trach tube in place with PSV.  Cardiac: RRR. No JVD   Respiratory: Stridor and wheezes. Unlabored at rest GI: Bowel sounds are normal. She exhibits no distension.  Musculoskeletal: She exhibits no tenderness or deformity.  Neurological: Alert Makes good eye contact with examiner.  Followed full commands.  Motor: B/l UE 4-/5 prox to distal.  B/l LE:   2+/5 HF, 3-/5 KE and 3+/5 ADF/PF.  LUE tremor. Psychiatric: pleasant  Assessment/Plan: 1.  Functional  deficits secondary to debility which require 3+ hours per day of interdisciplinary therapy in a comprehensive inpatient rehab setting. Physiatrist is providing close team supervision and 24 hour management of active medical problems listed below. Physiatrist and rehab team continue to assess barriers to discharge/monitor patient progress toward functional and medical goals.  Function:  Bathing Bathing position Bathing activity did not occur: (per nursing only peri care done, patient refuses bathing ) Position: Shower  Bathing parts Body parts bathed by patient: Right arm, Left arm, Chest, Abdomen, Front perineal area, Right upper leg, Left upper leg Body parts bathed by helper: Buttocks, Right lower leg, Left lower leg, Back  Bathing assist Assist Level: Touching or steadying assistance(Pt > 75%)      Upper Body Dressing/Undressing Upper body dressing   What is the patient wearing?: Pull over shirt/dress     Pull over shirt/dress - Perfomed by patient: Thread/unthread right sleeve, Thread/unthread left sleeve, Put head through opening Pull over shirt/dress - Perfomed by helper: Pull shirt over trunk Button up shirt - Perfomed by patient: Thread/unthread right sleeve, Thread/unthread left sleeve Button up shirt - Perfomed by helper: Thread/unthread right sleeve, Thread/unthread left sleeve, Pull shirt around back    Upper body assist Assist Level: Touching or steadying assistance(Pt > 75%)      Lower Body Dressing/Undressing Lower body dressing   What is the patient wearing?: [employer], Socks       Pants- Performed by helper: Thread/unthread right pants leg, Thread/unthread left pants leg, Pull pants up/down Non-skid slipper socks- Performed by patient: Don/doff left sock Non-skid slipper socks- Performed by helper: Don/doff right sock, Don/doff left sock   Socks - Performed by helper: Don/doff right sock, Don/doff left sock   Shoes - Performed by helper: Don/doff right shoe,  Don/doff left shoe       TED  Hose - Performed by helper: Don/doff right TED hose, Don/doff left TED hose  Lower body assist Assist for lower body dressing: Touching or steadying assistance (Pt > 75%)      Toileting Toileting   Toileting steps completed by patient: Performs perineal hygiene Toileting steps completed by helper: Adjust clothing prior to toileting, Adjust clothing after toileting Toileting Assistive Devices: Grab bar or rail  Toileting assist Assist level: Touching or steadying assistance (Pt.75%)   Transfers Chair/bed transfer   Chair/bed transfer method: Ambulatory Chair/bed transfer assist level: Moderate assist (Pt 50 - 74%/lift or lower) Chair/bed transfer assistive device: Medical sales representative     Max distance: 40 ft  Assist level: Touching or steadying assistance (Pt > 75%)   Wheelchair Wheelchair activity did not occur: Safety/medical concerns Type: Manual   Assist Level: Dependent (Pt equals 0%)  Cognition Comprehension Comprehension assist level: Follows basic conversation/direction with no assist  Expression Expression assist level: Expresses basic needs/ideas: With extra time/assistive device  Social Interaction Social Interaction assist level: Interacts appropriately 90% of the time - Needs monitoring or encouragement for participation or interaction.  Problem Solving Problem solving assist level: Solves basic 50 - 74% of the time/requires cueing 25 - 49% of the time  Memory Memory assist level: Recognizes or recalls 50 - 74% of the time/requires cueing 25 - 49% of the time  Medical Problem List and Plan:  1. Debility secondary to debility related to abdominal aneurysm/AS with AVR 07/10/2017 status post repair and subsequent cardiogenic shock    Cont CIR    Notes reviewed, images reviewed, labs reviewed 2. DVT Prophylaxis/Anticoagulation: Subcutaneous Lovenox. Dopplers negative 3. Pain Management: Tylenol as needed  4. Mood: Klonopin 1  mg daily at bedtime, Seroquel 75 mg bedtime, Effexor 37.5 mg twice a day Klonopin too sedating during the day, cont BuSpar   -Klonopin as needed during the day for anxiety, this appears to be under good control at current time 5. Neuropsych: This patient is capable of making decisions on her own behalf.  6. Skin/Wound Care: Routine skin checks  7. Fluids/Electrolytes/Nutrition:    -dietary intake is poor. now on D3 and thin.    -hypokalemia due to Lasix: 3.2 on 2/26   Labs ordered for tomorrow   -cont megace  8. Tracheostomy 07/25/2017. Downsized to #4 cuffless 08/08/2017. No current plan to decannulate due to failure with capping. Follow-up per Dr. Nelda Marseille.    -Continue nebulizers, trach care and suction   -Guaifenesin to help with secretions   -continue with PMV    -CONTINUE CAPPING TRIALS during day.  Anxiety is an issue    -tolerated briefly yesterday 9. Atrial fibrillation. Amiodarone 200 mg daily, ASA 81mg . Cardiac rate controlled  10. Dysphagia. Advanced to D3/thins   CXR reviewed, stable 11. Acute diastolic congestive heart failure. Lasix 40 mg daily, Aldactone 25 mg daily. Monitor for any signs of fluid overload    -continue current diuretics.  BUN stable --recheck Monday Filed Weights   08/21/17 0441 08/22/17 0600 08/23/17 0420  Weight: 72.1 kg (158 lb 15.2 oz) 72.3 kg (159 lb 6.3 oz) 71 kg (156 lb 8.4 oz)   12. Hypothyroidism. Synthroid   LOS (Days) 11 A FACE TO FACE EVALUATION WAS PERFORMED  [name] Terri Phenix, MD 08/23/2017 11:38 AM

## 2017-08-23 NOTE — Progress Notes (Signed)
Physical Therapy Session Note  Patient Details  Name: [name] MRN: [id number] Date of Birth: 04/10/1941  Today's Date: 08/23/2017 PT Individual Time:   0930-1030    22mns Short Term Goals: Week 1:  PT Short Term Goal 1 (Week 1): Pt will perform <>stand with mod assist while maintaining sternal precautions PT Short Term Goal 1 - Progress (Week 1): Updated due to goal met PT Short Term Goal 2 (Week 1): Pt will perform stand pivot transfer with min assist and use of RW PT Short Term Goal 2 - Progress (Week 1): Updated due to goal met PT Short Term Goal 3 (Week 1): Pt will ambualted x 50 ft with RW and mod assist PT Short Term Goal 3 - Progress (Week 1): Progressing toward goal  Skilled Therapeutic Interventions/Progress Updates:    Pt greeted supine in bed, handed off from RN, agreeable to therapy, pt reports fatigue. Pt dependently transported to therapy gym d/t precautions, pt trach capped during session however switched to PMeigsd/t pt anxious and SOB, defer to RN note for more detail. Pt transfers W/C<>mat using RW with min A to boost to stand, multiple trials required to complete. Pt performs sit<>stand from foam on mat emphasizing endurance and BLE strengthening with decreasing rest between standing trials, pt able to perform 3 sit<>stands consecutively with verbal cuing and max encouragement SatO2 maintained at 93-94% with cap, pt becomes SOB and anxious, requests to lie supine for PMV to be replaced. Performs 2 x 30 sec hip flexor stretch off edge of mat, requires maxA to scoot hips and shoulders to edge to maintain precautions. Performs 2 x 12 supine heel slides. Pt transitions supine t osit with modA to maintain precautions. Performs standing 1 x 20 marching and attempts standing calf raise however pt unable to raise on toes without flexing hip and knee, performs 1 x 10 seated heel raise with verbal cues to increase ROM. Pt ambulates 85 feet using RW with minA for balance, W/C follow d/t  endurance deficits and variable balance. Performs 7 mins on nustep using BLE only level 2 with improved tolerance. At end of session, pt returned to room,  transfer WC>bed as above, left in supine with all needs within reach, husband and family present.   Therapy Documentation Precautions:  Precautions Precautions: Fall, Sternal Precaution Comments: Reinforced sternal precautions Restrictions Weight Bearing Restrictions: (sternal precautions) Other Position/Activity Restrictions: sternal precautions   See Function Navigator for Current Functional Status.   Therapy/Group: Individual Therapy  MSalvatore Decent2/27/2019, 4:13 PM

## 2017-08-23 NOTE — Progress Notes (Signed)
Speech Language Pathology Daily Session Note  Patient Details  Name: [name] MRN: [id number] Date of Birth: Jan 11, 1941  Today's Date: 08/23/2017 SLP Individual Time: 64-1530 SLP Individual Time Calculation (min): 55 min  Short Term Goals: Week 2: SLP Short Term Goal 1 (Week 2): Pt will donn and doff PMSV with Max A cues.  SLP Short Term Goal 2 (Week 2): Pt will utilize external memory aid to recall basic information with Max A cues (such as swallowing/diet recommendation information).  SLP Short Term Goal 3 (Week 2): Pt will demonstrate basic problem solving with familiar tasks and Min A cues.  SLP Short Term Goal 4 (Week 2): Pt will demonstrate selective attention for ~ 30 minutes with supervision cues.  SLP Short Term Goal 5 (Week 2): Pt will consume regular diet with thin liquids without overt s/s of aspiration with supervision cues for use of compensatory swallow strategies.   Skilled Therapeutic Interventions:  Pt was seen for skilled ST targeting cognitive goals.  Pt could recall care and cleaning of PMSV from yesterday's therapy session with min question cues and had directed PT to fill information out in her memory notebook before leaving therapy session.  SLP facilitated the session with a novel card game targeting problem solving, attention to task, and recall of new information.  Pt needed mod assist for working memory of task rules and procedures.  She selectively attended to task in a moderately distracting environment for ~20 minutes with min verbal cues for redirection.  She needed min-mod assist to plan and execute a problem solving strategy.  Pt was returned to room and left in bed with bed alarm set and call bell within reach.  Continue per current plan of care.    Function:  Eating Eating                 Cognition Comprehension Comprehension assist level: Follows basic conversation/direction with no assist  Expression Expression assistive device: Talk trach  valve Expression assist level: Expresses basic needs/ideas: With extra time/assistive device  Social Interaction Social Interaction assist level: Interacts appropriately 90% of the time - Needs monitoring or encouragement for participation or interaction.  Problem Solving Problem solving assist level: Solves basic 75 - 89% of the time/requires cueing 10 - 24% of the time  Memory Memory assist level: Recognizes or recalls 75 - 89% of the time/requires cueing 10 - 24% of the time    Pain Pain Assessment Pain Assessment: No/denies pain  Therapy/Group: Individual Therapy  Treyvonne Tata, Selinda Orion 08/23/2017, 4:26 PM

## 2017-08-23 NOTE — Progress Notes (Signed)
Pt in therapy at this time, unable to complete scheduled trach check. RT will continue to monitor

## 2017-08-23 NOTE — Progress Notes (Signed)
RN capped trach without patients awareness (husband was aware) of doing so, so that RN could assess capped trail without patients anxiety being an issue.  With trach capped and pt participating in therapy oxygen ranged between 95-97%. Pt started to notice that her breathing was heavier and stated that she "felt a tickle in the back of my throat", and suddenly pt became hot.  RN with PT checked oxygen with a result of 96% and RN assured pt that she was doing great. With her anxiety rising, RN assessed situation and replaced the capped trach with PMSV trach. Pt was unaware of the trach being capped from the beginning. RN explained process and importance of capped trach and the concern with her anxiety and the process of trach removal. RN discussed result with husband and concerns relating to her anxiety. Husband notified RN that this is a prior issue and concern with anxiety. RN to continue to educate and attempt capped trach trails

## 2017-08-24 ENCOUNTER — Inpatient Hospital Stay (HOSPITAL_COMMUNITY): Payer: Medicare Other

## 2017-08-24 ENCOUNTER — Inpatient Hospital Stay (HOSPITAL_COMMUNITY): Payer: Medicare Other | Admitting: Occupational Therapy

## 2017-08-24 ENCOUNTER — Inpatient Hospital Stay (HOSPITAL_COMMUNITY): Payer: Medicare Other | Admitting: Physical Therapy

## 2017-08-24 ENCOUNTER — Inpatient Hospital Stay (HOSPITAL_COMMUNITY): Payer: Medicare Other | Admitting: Speech Pathology

## 2017-08-24 MED ORDER — PRO-STAT SUGAR FREE PO LIQD
30.0000 mL | Freq: Two times a day (BID) | ORAL | Status: DC
  Administered 2017-08-24 – 2017-09-02 (×15): 30 mL via ORAL
  Filled 2017-08-24 (×15): qty 30

## 2017-08-24 MED ORDER — POTASSIUM CHLORIDE 20 MEQ/15ML (10%) PO SOLN
20.0000 meq | Freq: Two times a day (BID) | ORAL | Status: DC
  Administered 2017-08-24 – 2017-08-25 (×3): 20 meq via ORAL
  Filled 2017-08-24 (×3): qty 15

## 2017-08-24 MED ORDER — BUSPIRONE HCL 5 MG PO TABS
7.5000 mg | ORAL_TABLET | Freq: Every day | ORAL | Status: DC
  Administered 2017-08-25 – 2017-08-26 (×2): 7.5 mg via ORAL
  Filled 2017-08-24 (×4): qty 2

## 2017-08-24 NOTE — Progress Notes (Signed)
[facility] PHYSICAL MEDICINE & REHABILITATION     PROGRESS NOTE    Subjective/Complaints: Tolerating Passy-Muir valve however becomes anxious when trach is capped.  ROS: +DOE. Denies nausea, vomiting, diarrhea, shortness of breath or chest pain   Objective: Vital Signs:  Blood pressure 138/64, pulse 88, temperature 98 F (36.7 C), temperature source Oral, resp. rate 20, height 5\' 5"  (1.651 m), weight 70.4 kg (155 lb 3.3 oz), SpO2 97 %. Vas [place] Lower Extremity Venous (dvt)  Result Date: 08/15/2017  Lower Venous Study Indication: Edema. Risk Factors: Surgery aortic aneurysm repair. Examination Guidelines: A complete evaluation includes B-mode imaging, spectral doppler, color doppler, and power doppler as needed of all accessible portions of each vessel. Bilateral testing is considered an integral part of a complete examination. Limited examinations for reoccurring indications may be performed as noted. The reflux portion of the exam is performed with the patient in reverse Trendelenburg.  Right Venous Findings: +---------+---------------+---------+-----------+----------+-----------------+          CompressibilityPhasicitySpontaneityPropertiesSummary           +---------+---------------+---------+-----------+----------+-----------------+ CFV      Full           Yes      Yes                                    +---------+---------------+---------+-----------+----------+-----------------+ FV Prox  Full           Yes      Yes                                    +---------+---------------+---------+-----------+----------+-----------------+ FV Mid   Full           Yes      Yes                                    +---------+---------------+---------+-----------+----------+-----------------+ FV DistalFull           Yes      Yes                                    +---------+---------------+---------+-----------+----------+-----------------+ PFV      Full           Yes       Yes                                    +---------+---------------+---------+-----------+----------+-----------------+ POP      Full           Yes      Yes                                    +---------+---------------+---------+-----------+----------+-----------------+ PTV      Full                                                           +---------+---------------+---------+-----------+----------+-----------------+  PERO     Full                                                           +---------+---------------+---------+-----------+----------+-----------------+ GSV      Partial                                      Age Indeterminate +---------+---------------+---------+-----------+----------+-----------------+  Left Venous Findings: +---------+---------------+---------+-----------+----------+-------+          CompressibilityPhasicitySpontaneityPropertiesSummary +---------+---------------+---------+-----------+----------+-------+ CFV      Full           Yes      Yes                          +---------+---------------+---------+-----------+----------+-------+ FV Prox  Full           Yes      Yes                          +---------+---------------+---------+-----------+----------+-------+ FV Mid   Full           Yes      Yes                          +---------+---------------+---------+-----------+----------+-------+ FV DistalFull           Yes      Yes                          +---------+---------------+---------+-----------+----------+-------+ PFV      Full           Yes      Yes                          +---------+---------------+---------+-----------+----------+-------+ POP      Full           Yes      Yes                          +---------+---------------+---------+-----------+----------+-------+ PTV      Full                                                 +---------+---------------+---------+-----------+----------+-------+  PERO     Full                                                 +---------+---------------+---------+-----------+----------+-------+ GSV      Full                                                 +---------+---------------+---------+-----------+----------+-------+    Final Interpretation: Right: There is no evidence of deep vein thrombosis  in the lower extremity. Positive for an age interminate superficial thrombosis of the greater saphenous vein just distal to the confluence with the saphenofemoral juction. There is no evidence of a Baker's cyst. Left: There is no evidence of deep vein thrombosis in the lower extremity.There is no evidence of superficial venous thrombosis. There is no evidence of a Baker's cyst.  *See table(s) above for measurements and observations. Electronically signed by Deitra Mayo on 08/15/2017 at 4:50:03 PM.    No results for input(s): WBC, HGB, HCT, PLT in the last 72 hours. Recent Labs    08/21/17 1455 08/22/17 0744  NA 133* 136  K 3.1* 3.2*  CL 88* 91*  GLUCOSE 93 98  BUN 15 13  CREATININE 0.77 0.74  CALCIUM 8.0* 8.0*   CBG (last 3)  No results for input(s): GLUCAP in the last 72 hours.  Wt Readings from Last 3 Encounters:  08/24/17 70.4 kg (155 lb 3.3 oz)  08/12/17 73.8 kg (162 lb 11.2 oz)  07/06/17 76.5 kg (168 lb 11.2 oz)    Physical Exam:  HENT: Normocephalic. Atraumatic.  Eyes: EOMI. No dischage.  Neck: Trach tube in place with PSV.  Cardiac: RRR. No JVD   Respiratory: Stridor and wheezes. Unlabored at rest GI: Bowel sounds are normal. She exhibits no distension.  Musculoskeletal: She exhibits no tenderness or deformity.  Neurological: Alert Makes good eye contact with examiner.  Followed full commands.  Motor: B/l UE 4-/5 prox to distal.  B/l LE:   2+/5 HF, 3-/5 KE and 3+/5 ADF/PF.  LUE tremor. Psychiatric: pleasant  Assessment/Plan: 1.  Functional deficits secondary to debility which require 3+ hours per day of  interdisciplinary therapy in a comprehensive inpatient rehab setting. Physiatrist is providing close team supervision and 24 hour management of active medical problems listed below. Physiatrist and rehab team continue to assess barriers to discharge/monitor patient progress toward functional and medical goals.  Function:  Bathing Bathing position Bathing activity did not occur: (per nursing only peri care done, patient refuses bathing ) Position: Shower  Bathing parts Body parts bathed by patient: Right arm, Left arm, Chest, Abdomen, Front perineal area, Right upper leg, Left upper leg Body parts bathed by helper: Buttocks, Right lower leg, Left lower leg, Back  Bathing assist Assist Level: Touching or steadying assistance(Pt > 75%)      Upper Body Dressing/Undressing Upper body dressing   What is the patient wearing?: Pull over shirt/dress     Pull over shirt/dress - Perfomed by patient: Thread/unthread right sleeve, Thread/unthread left sleeve, Put head through opening Pull over shirt/dress - Perfomed by helper: Pull shirt over trunk Button up shirt - Perfomed by patient: Thread/unthread right sleeve, Thread/unthread left sleeve Button up shirt - Perfomed by helper: Thread/unthread right sleeve, Thread/unthread left sleeve, Pull shirt around back    Upper body assist Assist Level: Touching or steadying assistance(Pt > 75%)      Lower Body Dressing/Undressing Lower body dressing   What is the patient wearing?: [employer], Socks       Pants- Performed by helper: Thread/unthread right pants leg, Thread/unthread left pants leg, Pull pants up/down Non-skid slipper socks- Performed by patient: Don/doff left sock Non-skid slipper socks- Performed by helper: Don/doff right sock, Don/doff left sock   Socks - Performed by helper: Don/doff right sock, Don/doff left sock   Shoes - Performed by helper: Don/doff right shoe, Don/doff left shoe       TED Hose - Performed by helper:  Don/doff right TED  hose, Don/doff left TED hose  Lower body assist Assist for lower body dressing: Touching or steadying assistance (Pt > 75%)      Toileting Toileting   Toileting steps completed by patient: Adjust clothing prior to toileting, Performs perineal hygiene Toileting steps completed by helper: Adjust clothing after toileting Toileting Assistive Devices: Grab bar or rail  Toileting assist Assist level: Touching or steadying assistance (Pt.75%)   Transfers Chair/bed transfer   Chair/bed transfer method: Ambulatory Chair/bed transfer assist level: Touching or steadying assistance (Pt > 75%) Chair/bed transfer assistive device: Medical sales representative     Max distance: 55ft Assist level: Touching or steadying assistance (Pt > 75%)   Wheelchair Wheelchair activity did not occur: Safety/medical concerns Type: Manual   Assist Level: Dependent (Pt equals 0%)  Cognition Comprehension Comprehension assist level: Follows basic conversation/direction with no assist  Expression Expression assist level: Expresses basic needs/ideas: With extra time/assistive device  Social Interaction Social Interaction assist level: Interacts appropriately 90% of the time - Needs monitoring or encouragement for participation or interaction.  Problem Solving Problem solving assist level: Solves basic 75 - 89% of the time/requires cueing 10 - 24% of the time  Memory Memory assist level: Recognizes or recalls 75 - 89% of the time/requires cueing 10 - 24% of the time  Medical Problem List and Plan:  1. Debility secondary to debility related to abdominal aneurysm/AS with AVR 07/10/2017 status post repair and subsequent cardiogenic shock    Cont CIR PT OT speech   2. DVT Prophylaxis/Anticoagulation: Subcutaneous Lovenox. Dopplers negative 3. Pain Management: Tylenol as needed  4. Mood: Klonopin 1 mg daily at bedtime, Seroquel 75 mg bedtime, Effexor 37.5 mg twice a day Klonopin too sedating  during the day, increase BuSpar   -Klonopin as needed during the day for anxiety, this appears to be under good control at current time 5. Neuropsych: This patient is capable of making decisions on her own behalf.  6. Skin/Wound Care: Routine skin checks  7. Fluids/Electrolytes/Nutrition:    -dietary intake is poor. now on D3 and thin.    -hypokalemia due to Lasix: 3.2 on 2/26, will continue potassium 20 mEq twice a day and recheck potassium in a.m.    -cont megace  8. Tracheostomy 07/25/2017. Downsized to #4 cuffless 08/08/2017. No current plan to decannulate due to failure with capping. Follow-up per Dr. Nelda Marseille.    -Continue nebulizers, trach care and suction   -Guaifenesin to help with secretions   -continue with PMV    -CONTINUE CAPPING TRIALS during day.  Anxiety is an issue    -Hopefully tolerates better on higher dose of BuSpar 9. Atrial fibrillation. Amiodarone 200 mg daily, ASA 81mg . Cardiac rate controlled  10. Dysphagia. Advanced to D3/thins   CXR reviewed, stable 11. Acute diastolic congestive heart failure. Lasix 40 mg daily, Aldactone 25 mg daily. Monitor for any signs of fluid overload    -continue current diuretics.  BUN stable --recheck Monday Filed Weights   08/22/17 0600 08/23/17 0420 08/24/17 0500  Weight: 72.3 kg (159 lb 6.3 oz) 71 kg (156 lb 8.4 oz) 70.4 kg (155 lb 3.3 oz)   12. Hypothyroidism. Synthroid   LOS (Days) 12 A FACE TO FACE EVALUATION WAS PERFORMED  Charlett Blake, MD 08/24/2017 10:20 AM

## 2017-08-24 NOTE — Progress Notes (Signed)
Came to check pt trach, pt in wheelchair going to therapy.  No distress noted, trach appears clean and secure.

## 2017-08-24 NOTE — Progress Notes (Signed)
Physical Therapy Session Note  Patient Details  Name: [name] MRN: [id number] Date of Birth: 1940-11-17  Today's Date: 08/24/2017 PT Individual Time: 0800-0900 PT Individual Time Calculation (min): 60 min   Short Term Goals: Week 1:  PT Short Term Goal 1 (Week 1): Pt will perform <>stand with mod assist while maintaining sternal precautions PT Short Term Goal 1 - Progress (Week 1): Updated due to goal met PT Short Term Goal 2 (Week 1): Pt will perform stand pivot transfer with min assist and use of RW PT Short Term Goal 2 - Progress (Week 1): Updated due to goal met PT Short Term Goal 3 (Week 1): Pt will ambualted x 50 ft with RW and mod assist PT Short Term Goal 3 - Progress (Week 1): Progressing toward goal Week 2:  PT Short Term Goal 1 (Week 2): Pt will ambulate x 37f using RW with mod assist PT Short Term Goal 2 (Week 2): Pt will perform sit<>stand with consistent minA while maintaining sternal precuations PT Short Term Goal 3 (Week 2): Pt will perform stand pivot transfer with min assist and use of RW  Skilled Therapeutic Interventions/Progress Updates:    Pt greeted supine in bed, pt upset that she had soiled brief and nursing had not come to help her and that she had not received breaksfast, attended to pt complaints and provided washcloths for pt to perform hygiene tasks with modA to rollR/L to clear buttocks for tasks.  Pt donned underwear and pants in supine, needs assistance with threading pants to ankles, pt able to pull up the rest of the way. Pt transitions supine to sit with modA for trunk, attempted use of pillow to hug to decrease caregiver burden however pt unable to complete task without assistance. Pt maintained sitting balance for approximately 10 minutes while eating breakfast with supervision, MD entered session to perform assessment. Pt performs stand pivot transfer bed<>W/C with minA for boosting with verbal cues to maintain anterior lean. Pt dependently  transported to therapy gym hallway, modA sit<>stand from chair using RW, performs 252fside stepping each to R/L with BUE on hallway railing. Pt requires seated rest break, ambulates approximately 40 feet using RW with minguard for safety. At end of session, pt returned to room dependently, stand pivot transfer W/C to bed, modA sit to supine for BLE lifting, left in supine with all needs within reach.   Therapy Documentation Precautions:  Precautions Precautions: Fall, Sternal Precaution Comments: Reinforced sternal precautions Restrictions Weight Bearing Restrictions: (sternal precautions) Other Position/Activity Restrictions: sternal precautions Vital Signs: Therapy Vitals Pulse Rate: 88 Resp: 20 Patient Position (if appropriate): Lying Oxygen Therapy SpO2: 97 % O2 Device: Room Air  See Function Navigator for Current Functional Status.   Therapy/Group: Individual Therapy  MoSalvatore Decent/28/2019, 12:11 PM

## 2017-08-24 NOTE — Progress Notes (Signed)
Speech Language Pathology Daily Session Note  Patient Details  Name: [name] MRN: [id number] Date of Birth: Sep 08, 1940  Today's Date: 08/24/2017 SLP Individual Time: 1300-1400 SLP Individual Time Calculation (min): 60 min  Short Term Goals: Week 2: SLP Short Term Goal 1 (Week 2): Pt will donn and doff PMSV with Max A cues.  SLP Short Term Goal 2 (Week 2): Pt will utilize external memory aid to recall basic information with Max A cues (such as swallowing/diet recommendation information).  SLP Short Term Goal 3 (Week 2): Pt will demonstrate basic problem solving with familiar tasks and Min A cues.  SLP Short Term Goal 4 (Week 2): Pt will demonstrate selective attention for ~ 30 minutes with supervision cues.  SLP Short Term Goal 5 (Week 2): Pt will consume regular diet with thin liquids without overt s/s of aspiration with supervision cues for use of compensatory swallow strategies.   Skilled Therapeutic Interventions: Skilled treatment session focused on cognition goals. SLP facilitated by providing bed level therapy task d/t pt not feeling well. PMSV in place and pt tolerating well. Pt able to recall events from today but didn't write in memory book (SLP forgot - more energy given to supporting pt when not feeling well and SLP forgot). Pt able to consume ginger ale via straw without overt s/s of aspiration. Able to describe PMSV cleaning and continues to have mild difficulty donning valve d/t tremors. Pt left in bed with all needs within reach.      Function:    Cognition Comprehension Comprehension assist level: Follows basic conversation/direction with no assist  Expression Expression assistive device: Talk trach valve Expression assist level: Expresses basic needs/ideas: With extra time/assistive device  Social Interaction Social Interaction assist level: Interacts appropriately 90% of the time - Needs monitoring or encouragement for participation or interaction.  Problem Solving  Problem solving assist level: Solves basic 75 - 89% of the time/requires cueing 10 - 24% of the time  Memory Memory assist level: Recognizes or recalls 75 - 89% of the time/requires cueing 10 - 24% of the time    Pain    Therapy/Group: Individual Therapy  Tyjanae Bartek 08/24/2017, 3:12 PM

## 2017-08-24 NOTE — Progress Notes (Signed)
Nutrition Follow-up  DOCUMENTATION CODES:   Not applicable  INTERVENTION:   RD to order snacks for pt BID.  RD to continue Ensure Enlive po BID, each supplement provides 350kcals and 20g of protein.  Recommend mixing Ensure (chocolate) with vanilla Magic cup. Each [employer]  provides 290kcals and 9g of protein.  RD to order prostat BID; each supplement provides 15g protein and 100kcal. Recommend mixing with juice.  RD to continue MVI daily.   NUTRITION DIAGNOSIS:   Inadequate oral intake related to other (see comment), poor appetite(pt dislike of dysphagia diet) as evidenced by per patient/family report. Ongoing.   GOAL:   Patient will meet greater than or equal to 90% of their needs Not meeting.  MONITOR:   PO intake, I & O's, Weight trends, Diet advancement, Skin, Supplement acceptance  ASSESSMENT:   [name] with PMH of HTN, HLD, and B12 deficiency. S/p planned repair of ascending aortic aneurysm and aortic valve replacement on 07/10/2017. S/p right ventricular assist device and hematoma exploration/removal on 07/11/2017. RVAD removed and sternum closed on 07/17/2017. Pt required vent support and subsequent tracheostomy on 07/25/2017. Tube feeds D/C 08/14/2017; pt on Dysphagia I, pudding thick liquids 08/12/2017.   2/20 Dysphagia 3, thin liquids 2/22 Megace started  Pt reports some improvement in appetite but continues to report dislike of hospital food and poor intake. Pt/family reports pt is roughly consuming 25% of meals. Per family report, pt only consumes the "foods she likes." Pt has not been drinking Ensure but family reports pt drank one Ensure mixed with ice cream today which she liked. RD to order. Pt also agreeable to RD ordering snacks between meals.   Intern continued to encourage oral intake to provide adequate calories and protein for wound healing and rehab.   Pt denies nausea and reports constipation has resolved. Pt reports occasional episodes of  vomiting due to food getting caught in trach. She reports this does not happen at every meal.   Per chart review, pt's wt is trending downward and pt has lost 9lb since admission on 2/16. Pt has also been on Lasix since admission.  Medications: Megace, lasix, levothyroxine, MVI, senokot.  Labs from 2/26: K (L;3.2).  Diet Order:  DIET DYS 3 Room service appropriate? Yes; Fluid consistency: Thin  EDUCATION NEEDS:   Education needs have been addressed  Skin:  Skin Assessment: Skin Integrity Issues: Skin Integrity Issues:: Stage II, Incisions, Other (Comment) Stage II: Neck  Incisions: abdomen, groin, chest x2 Other: Laceration/skin tears lower legs  Last BM:  2/27 type 5 smear  Height:   Ht Readings from Last 1 Encounters:  08/12/17 5\' 5"  (1.651 m)    Weight:   Filed Weights   08/22/17 0600 08/23/17 0420 08/24/17 0500  Weight: 159 lb 6.3 oz (72.3 kg) 156 lb 8.4 oz (71 kg) 155 lb 3.3 oz (70.4 kg)    Ideal Body Weight:  56.8 kg  BMI:  Body mass index is 25.83 kg/m.  Estimated Nutritional Needs:   Kcal:  1600-1800  Protein:  85-95g  Fluid:  1.6-1.8L  Yerania Chamorro, MS, Dietetic Intern Pager # [phone]

## 2017-08-24 NOTE — Progress Notes (Signed)
Occupational Therapy Session Note  Patient Details  Name: [name] MRN: [id number] Date of Birth: 1940/11/24  Today's Date: 08/24/2017 OT Individual Time: 1100-1200 OT Individual Time Calculation (min): 60 min    Short Term Goals: Week 2:  OT Short Term Goal 1 (Week 2): Pt will recall and utilize sternal pre-cautions during functional tasks with mod A OT Short Term Goal 2 (Week 2): Pt will perform toileting tasks with steady A OT Short Term Goal 3 (Week 2): Pt will perform toilet transfers with steady A OT Short Term Goal 4 (Week 2): Pt will complete LB dressing tasks with min A  Skilled Therapeutic Interventions/Progress Updates:    OT intervention with focus on dressing tasks, functional amb with RW, toilet transfers, toielting, and safety awareness to increase independence with BADLs.  Pt requires min A for sit<>stand and max verbal cues for following sternal precautions.  Pt amb with RW to bathroom and out into hallway X 5 with steady A.  Pt required assistance pulling up pants after using toilet.  Pt remained in w/c with QRB in place and all needs within reach.   Therapy Documentation Precautions:  Precautions Precautions: Fall, Sternal Precaution Comments: Reinforced sternal precautions Restrictions Weight Bearing Restrictions: (sternal precautions) Other Position/Activity Restrictions: sternal precautions    Vital Signs: Therapy Vitals Pulse Rate: 88 Resp: 20 Patient Position (if appropriate): Lying Oxygen Therapy SpO2: 97 % O2 Device: Room Air Pain:  Pt denies pain  See Function Navigator for Current Functional Status.   Therapy/Group: Individual Therapy  Leroy Libman 08/24/2017, 12:24 PM

## 2017-08-24 NOTE — Progress Notes (Signed)
Occupational Therapy Session Note  Patient Details  Name: [name] MRN: [id number] Date of Birth: [birth year]  Today's Date: 08/24/2017 OT Individual Time: 1330-1400 OT Individual Time Calculation (min): 30 min    Short Term Goals: Week 2:  OT Short Term Goal 1 (Week 2): Pt will recall and utilize sternal pre-cautions during functional tasks with mod A OT Short Term Goal 2 (Week 2): Pt will perform toileting tasks with steady A OT Short Term Goal 3 (Week 2): Pt will perform toilet transfers with steady A OT Short Term Goal 4 (Week 2): Pt will complete LB dressing tasks with min A  Skilled Therapeutic Interventions/Progress Updates:    Upon entering the room, pt supine in bed with husband present in room. Pt requiring encouragement for participation this session. Pt performed supine >Sit with min A to EOB with mod multimodal cues for technique and weight shift. Pt standing with mod A and ambulating 10' into bathroom with RW and min A. Pt transferred onto toilet with min A as well. Pt having BM this session and able to perform hygiene and clothing management with min A for standing balance. Pt performed hand hygiene at sink with steady assistance before returning to sit in recliner chair. Quick release belt donned and husband remained present in room. Pt O2 saturation remaining above 90% while on room air throughout session. HR increased to 120 bpm once seated from ambulation. HR decreased to 101bpm when therapist exited the room. RN notified.   Therapy Documentation Precautions:  Precautions Precautions: Fall, Sternal Precaution Comments: Reinforced sternal precautions Restrictions Weight Bearing Restrictions: (sternal precautions) Other Position/Activity Restrictions: sternal precautions General:   Vital Signs: Therapy Vitals Temp: 99.2 F (37.3 C) Temp Source: Oral Pulse Rate: 97 Resp: 16 BP: (!) 94/55 Patient Position (if appropriate): Sitting Oxygen Therapy SpO2: 96 % O2  Device: Room Air Other Treatments:    See Function Navigator for Current Functional Status.   Therapy/Group: Individual Therapy  Gypsy Decant 08/24/2017, 4:14 PM

## 2017-08-25 ENCOUNTER — Inpatient Hospital Stay (HOSPITAL_COMMUNITY): Payer: Medicare Other

## 2017-08-25 ENCOUNTER — Inpatient Hospital Stay (HOSPITAL_COMMUNITY): Payer: Medicare Other | Admitting: Physical Therapy

## 2017-08-25 ENCOUNTER — Inpatient Hospital Stay (HOSPITAL_COMMUNITY): Payer: Medicare Other | Admitting: Speech Pathology

## 2017-08-25 LAB — BASIC METABOLIC PANEL
ANION GAP: 11 (ref 5–15)
BUN: 14 mg/dL (ref 6–20)
CALCIUM: 8.1 mg/dL — AB (ref 8.9–10.3)
CHLORIDE: 96 mmol/L — AB (ref 101–111)
CO2: 30 mmol/L (ref 22–32)
Creatinine, Ser: 0.76 mg/dL (ref 0.44–1.00)
GFR calc Af Amer: >60 mL/min (ref >60)
GFR calc non Af Amer: >60 mL/min (ref >60)
GLUCOSE: 97 mg/dL (ref 65–99)
POTASSIUM: 3.4 mmol/L — AB (ref 3.5–5.1)
Sodium: 137 mmol/L (ref 135–145)

## 2017-08-25 MED ORDER — POTASSIUM CHLORIDE 20 MEQ/15ML (10%) PO SOLN
20.0000 meq | Freq: Three times a day (TID) | ORAL | Status: DC
  Administered 2017-08-25 – 2017-08-26 (×4): 20 meq via ORAL
  Filled 2017-08-25 (×5): qty 15

## 2017-08-25 NOTE — Progress Notes (Signed)
Speech Language Pathology Daily Session Note  Patient Details  Name: [name] MRN: [id number] Date of Birth: 02/27/1941  Today's Date: 08/25/2017 SLP Individual Time: 1093-2355 SLP Individual Time Calculation (min): 40 min  Short Term Goals: Week 2: SLP Short Term Goal 1 (Week 2): Pt will donn and doff PMSV with Max A cues.  SLP Short Term Goal 2 (Week 2): Pt will utilize external memory aid to recall basic information with Max A cues (such as swallowing/diet recommendation information).  SLP Short Term Goal 3 (Week 2): Pt will demonstrate basic problem solving with familiar tasks and Min A cues.  SLP Short Term Goal 4 (Week 2): Pt will demonstrate selective attention for ~ 30 minutes with supervision cues.  SLP Short Term Goal 5 (Week 2): Pt will consume regular diet with thin liquids without overt s/s of aspiration with supervision cues for use of compensatory swallow strategies.   Skilled Therapeutic Interventions:  Pt was seen for skilled ST targeting cognitive goals.  SLP facilitated the session with a novel card game targeting use of memory compensatory strategies, specifically word-picture associations.  Pt needed min-mod assist to generate and recall word-picture associations.  Pt completed task with mod assist for recall of 9/10 named items after and ~5 minute delay.  Pt was returned to room and left in bed with bed alarm set and call bell within reach.  Continue per current plan of care.     Function:  Eating Eating   Modified Consistency Diet: Yes Eating Assist Level: Set up assist for   Eating Set Up Assist For: Opening containers       Cognition Comprehension Comprehension assist level: Follows basic conversation/direction with no assist  Expression Expression assistive device: Communication board Expression assist level: Expresses basic needs/ideas: With no assist  Social Interaction Social Interaction assist level: Interacts appropriately with others with  medication or extra time (anti-anxiety, antidepressant).  Problem Solving Problem solving assist level: Solves basic 75 - 89% of the time/requires cueing 10 - 24% of the time  Memory Memory assist level: Recognizes or recalls 50 - 74% of the time/requires cueing 25 - 49% of the time    Pain Pain Assessment Pain Assessment: No/denies pain  Therapy/Group: Individual Therapy  Galdino Hinchman, Selinda Orion 08/25/2017, 1:51 PM

## 2017-08-25 NOTE — Progress Notes (Signed)
Physical Therapy Session Note  Patient Details  Name: [name] MRN: [id number] Date of Birth: 08-20-1940  Today's Date: 08/25/2017 PT Individual Time: 1045-1200 PT Individual Time Calculation (min): 75 min   Short Term Goals: Week 2:  PT Short Term Goal 1 (Week 2): Pt will ambulate x 53ft using RW with mod assist PT Short Term Goal 2 (Week 2): Pt will perform sit<>stand with consistent minA while maintaining sternal precuations PT Short Term Goal 3 (Week 2): Pt will perform stand pivot transfer with min assist and use of RW  Skilled Therapeutic Interventions/Progress Updates:    Pt seated in w/c in room, agreeable to participate in therapy this AM. Pt reports no pain. Sit to stand with mod assist from w/c due to low height, RW, verbal and manual cues for technique and weight shift. Ambulation x 60 ft, x 100 ft with RW and min assist for balance. SpO2 95% and above on room air with activity. Sit to/from stand 3 x 5 reps from mat table progressively lower heights to RW with Min Assist. Pt is unable to carryover techniques to sit to stand transfer from w/c and remains mod assist for that transfer. NuStep level 2 x 10 min with one rest break, BLE only. Pt requires frequent rest breaks between activities due to decreased endurance. Pt left seated in w/c in room with needs in reach, QRB in place, husband present.  Therapy Documentation Precautions:  Precautions Precautions: Fall, Sternal Precaution Comments: Reinforced sternal precautions Restrictions Weight Bearing Restrictions: (sternal precautions) Other Position/Activity Restrictions: sternal precautions  See Function Navigator for Current Functional Status.   Therapy/Group: Individual Therapy  Excell Seltzer, PT, DPT  08/25/2017, 12:27 PM

## 2017-08-25 NOTE — Progress Notes (Signed)
Occupational Therapy Session Note  Patient Details  Name: [name] MRN: [id number] Date of Birth: 30-Jul-1940  Today's Date: 08/25/2017 OT Individual Time: 0511-0211 OT Individual Time Calculation (min): 60 min    Short Term Goals: Week 2:  OT Short Term Goal 1 (Week 2): Pt will recall and utilize sternal pre-cautions during functional tasks with mod A OT Short Term Goal 2 (Week 2): Pt will perform toileting tasks with steady A OT Short Term Goal 3 (Week 2): Pt will perform toilet transfers with steady A OT Short Term Goal 4 (Week 2): Pt will complete LB dressing tasks with min A  Skilled Therapeutic Interventions/Progress Updates:    OT intervention with focus on BADL retraining including bathing/dressing with sit<>stand from w/c at sink.  Pt performed supine>sit EOB at supervision level and amb with RW to w/c at sink to complete bathing/dressing tasks.  Pt required assistance with bathing feet and threading pants over feet.  Pt amb with RW to bathroom for toileting.  Pt required assistance with hygiene and pulling pants over hips.  Pt requires more than a reasonable amount of time to complete all tasks with multiple rest breaks.  Pt requested RN to suction because she "felt junky." O2 sats>90% on RA throughout session. Pt remained in w/c with RN present.   Therapy Documentation Precautions:  Precautions Precautions: Fall, Sternal Precaution Comments: Reinforced sternal precautions Restrictions Weight Bearing Restrictions: (sternal precautions) Other Position/Activity Restrictions: sternal precautions Pain: Pain Assessment Pain Assessment: 0-10 Pain Score: 0-No pain  See Function Navigator for Current Functional Status.   Therapy/Group: Individual Therapy  Leroy Libman 08/25/2017, 10:59 AM

## 2017-08-25 NOTE — Plan of Care (Signed)
  Progressing Consults RH GENERAL PATIENT EDUCATION Description See Patient Education module for education specifics. 08/25/2017 1152 - Progressing by Glean Salen, RN RH BOWEL ELIMINATION RH STG MANAGE BOWEL WITH ASSISTANCE Description STG Manage Bowel with [place].    08/25/2017 1152 - Progressing by Glean Salen, RN Flowsheets Taken 08/25/2017 1152  STG: Pt will manage bowels with assistance 6-Modified independent RH STG MANAGE BOWEL W/MEDICATION W/ASSISTANCE Description STG Manage Bowel with Medication with Mod  I Assistance.   08/25/2017 1152 - Progressing by Glean Salen, RN Flowsheets Taken 08/25/2017 1152  STG: Pt will manage bowels with medication with assistance 6-Modified independent RH SKIN INTEGRITY RH STG SKIN FREE OF INFECTION/BREAKDOWN Description Mod I  08/25/2017 1152 - Progressing by Glean Salen, RN RH SAFETY RH STG ADHERE TO SAFETY PRECAUTIONS W/ASSISTANCE/DEVICE Description STG Adhere to Safety Precautions With Min Assistance/Device.  08/25/2017 1152 - Progressing by Glean Salen, RN Flowsheets Taken 08/25/2017 1152  STG:Pt will adhere to safety precautions with assistance/device 4-Minimal assistance RH BLADDER ELIMINATION RH STG MANAGE BLADDER WITH ASSISTANCE Description STG Manage Bladder With min Assistance  08/25/2017 1152 - Progressing by Glean Salen, RN Flowsheets Taken 08/25/2017 1152  STG: Pt will manage bladder with assistance 6-Modified independent

## 2017-08-25 NOTE — Progress Notes (Signed)
[facility]     PROGRESS NOTE    Subjective/Complaints: Patient has been refusing trach capping.  We discussed that she is on a medicine to reduce her anxiety during the day as well as medication at night.  ROS: +DOE. Denies nausea, vomiting, diarrhea, shortness of breath or chest pain   Objective: Vital Signs:  Blood pressure 122/67, pulse 95, temperature 97.8 F (36.6 C), temperature source Oral, resp. rate 16, height 5\' 5"  (1.651 m), weight 72 kg (158 lb 11.7 oz), SpO2 97 %. Vas [place] Lower Extremity Venous (dvt)  Result Date: 08/15/2017  Lower Venous Study Indication: Edema. Risk Factors: Surgery aortic aneurysm repair. Examination Guidelines: A complete evaluation includes B-mode imaging, spectral doppler, color doppler, and power doppler as needed of all accessible portions of each vessel. Bilateral testing is considered an integral part of a complete examination. Limited examinations for reoccurring indications may be performed as noted. The reflux portion of the exam is performed with the patient in reverse Trendelenburg.  Right Venous Findings: +---------+---------------+---------+-----------+----------+-----------------+          CompressibilityPhasicitySpontaneityPropertiesSummary           +---------+---------------+---------+-----------+----------+-----------------+ CFV      Full           Yes      Yes                                    +---------+---------------+---------+-----------+----------+-----------------+ FV Prox  Full           Yes      Yes                                    +---------+---------------+---------+-----------+----------+-----------------+ FV Mid   Full           Yes      Yes                                    +---------+---------------+---------+-----------+----------+-----------------+ FV DistalFull           Yes      Yes                                     +---------+---------------+---------+-----------+----------+-----------------+ PFV      Full           Yes      Yes                                    +---------+---------------+---------+-----------+----------+-----------------+ POP      Full           Yes      Yes                                    +---------+---------------+---------+-----------+----------+-----------------+ PTV      Full                                                           +---------+---------------+---------+-----------+----------+-----------------+  PERO     Full                                                           +---------+---------------+---------+-----------+----------+-----------------+ GSV      Partial                                      Age Indeterminate +---------+---------------+---------+-----------+----------+-----------------+  Left Venous Findings: +---------+---------------+---------+-----------+----------+-------+          CompressibilityPhasicitySpontaneityPropertiesSummary +---------+---------------+---------+-----------+----------+-------+ CFV      Full           Yes      Yes                          +---------+---------------+---------+-----------+----------+-------+ FV Prox  Full           Yes      Yes                          +---------+---------------+---------+-----------+----------+-------+ FV Mid   Full           Yes      Yes                          +---------+---------------+---------+-----------+----------+-------+ FV DistalFull           Yes      Yes                          +---------+---------------+---------+-----------+----------+-------+ PFV      Full           Yes      Yes                          +---------+---------------+---------+-----------+----------+-------+ POP      Full           Yes      Yes                          +---------+---------------+---------+-----------+----------+-------+ PTV      Full                                                  +---------+---------------+---------+-----------+----------+-------+ PERO     Full                                                 +---------+---------------+---------+-----------+----------+-------+ GSV      Full                                                 +---------+---------------+---------+-----------+----------+-------+    Final Interpretation: Right: There is no evidence of deep vein thrombosis  in the lower extremity. Positive for an age interminate superficial thrombosis of the greater saphenous vein just distal to the confluence with the saphenofemoral juction. There is no evidence of a Baker's cyst. Left: There is no evidence of deep vein thrombosis in the lower extremity.There is no evidence of superficial venous thrombosis. There is no evidence of a Baker's cyst.  *See table(s) above for measurements and observations. Electronically signed by Deitra Mayo on 08/15/2017 at 4:50:03 PM.    No results for input(s): WBC, HGB, HCT, PLT in the last 72 hours. Recent Labs    08/25/17 0624  NA 137  K 3.4*  CL 96*  GLUCOSE 97  BUN 14  CREATININE 0.76  CALCIUM 8.1*   CBG (last 3)  No results for input(s): GLUCAP in the last 72 hours.  Wt Readings from Last 3 Encounters:  08/25/17 72 kg (158 lb 11.7 oz)  08/12/17 73.8 kg (162 lb 11.2 oz)  07/06/17 76.5 kg (168 lb 11.2 oz)    Physical Exam:  HENT: Normocephalic. Atraumatic.  Eyes: EOMI. No dischage.  Neck: Trach tube in place with PSV.  Cardiac: RRR. No JVD   Respiratory: Stridor and wheezes. Unlabored at rest GI: Bowel sounds are normal. She exhibits no distension.  Musculoskeletal: She exhibits no tenderness or deformity.  Neurological: Alert Makes good eye contact with examiner.  Followed full commands.  Motor: B/l UE 4-/5 prox to distal.  B/l LE:   2+/5 HF, 3-/5 KE and 3+/5 ADF/PF.  LUE tremor. Psychiatric: pleasant  Assessment/Plan: 1.  Functional deficits  secondary to debility which require 3+ hours per day of interdisciplinary therapy in a comprehensive inpatient rehab setting. Physiatrist is providing close team supervision and 24 hour management of active medical problems listed below. Physiatrist and rehab team continue to assess barriers to discharge/monitor patient progress toward functional and medical goals.  Function:  Bathing Bathing position Bathing activity did not occur: (per nursing only peri care done, patient refuses bathing ) Position: Shower  Bathing parts Body parts bathed by patient: Right arm, Left arm, Chest, Abdomen, Front perineal area, Right upper leg, Left upper leg Body parts bathed by helper: Buttocks, Right lower leg, Left lower leg, Back  Bathing assist Assist Level: Touching or steadying assistance(Pt > 75%)      Upper Body Dressing/Undressing Upper body dressing   What is the patient wearing?: Pull over shirt/dress     Pull over shirt/dress - Perfomed by patient: Thread/unthread right sleeve, Thread/unthread left sleeve, Put head through opening Pull over shirt/dress - Perfomed by helper: Pull shirt over trunk Button up shirt - Perfomed by patient: Thread/unthread right sleeve, Thread/unthread left sleeve Button up shirt - Perfomed by helper: Thread/unthread right sleeve, Thread/unthread left sleeve, Pull shirt around back    Upper body assist Assist Level: Touching or steadying assistance(Pt > 75%)      Lower Body Dressing/Undressing Lower body dressing   What is the patient wearing?: [employer], Socks       Pants- Performed by helper: Thread/unthread right pants leg, Thread/unthread left pants leg, Pull pants up/down Non-skid slipper socks- Performed by patient: Don/doff left sock Non-skid slipper socks- Performed by helper: Don/doff right sock, Don/doff left sock   Socks - Performed by helper: Don/doff right sock, Don/doff left sock   Shoes - Performed by helper: Don/doff right shoe, Don/doff  left shoe       TED Hose - Performed by helper: Don/doff right TED hose, Don/doff left TED hose  Lower body assist  Assist for lower body dressing: Touching or steadying assistance (Pt > 75%)      Toileting Toileting   Toileting steps completed by patient: Adjust clothing prior to toileting, Performs perineal hygiene, Adjust clothing after toileting Toileting steps completed by helper: Adjust clothing after toileting Toileting Assistive Devices: Grab bar or rail  Toileting assist Assist level: Touching or steadying assistance (Pt.75%)   Transfers Chair/bed transfer   Chair/bed transfer method: (P) Ambulatory Chair/bed transfer assist level: Touching or steadying assistance (Pt > 75%) Chair/bed transfer assistive device: Medical sales representative     Max distance: 15' Assist level: Touching or steadying assistance (Pt > 75%)   Wheelchair Wheelchair activity did not occur: Safety/medical concerns Type: Manual   Assist Level: Dependent (Pt equals 0%)  Cognition Comprehension Comprehension assist level: Follows basic conversation/direction with no assist  Expression Expression assist level: Expresses basic needs/ideas: With extra time/assistive device  Social Interaction Social Interaction assist level: Interacts appropriately 90% of the time - Needs monitoring or encouragement for participation or interaction.  Problem Solving Problem solving assist level: Solves basic 75 - 89% of the time/requires cueing 10 - 24% of the time  Memory Memory assist level: Recognizes or recalls 75 - 89% of the time/requires cueing 10 - 24% of the time  Medical Problem List and Plan:  1. Debility secondary to debility related to abdominal aneurysm/AS with AVR 07/10/2017 status post repair and subsequent cardiogenic shock    Cont CIR PT OT speech   2. DVT Prophylaxis/Anticoagulation: Subcutaneous Lovenox. Dopplers negative 3. Pain Management: Tylenol as needed  4. Mood: Klonopin 1 mg daily  at bedtime, Seroquel 75 mg bedtime, Effexor 37.5 mg twice a day Klonopin too sedating during the day, increase BuSpar   -Klonopin as needed during the day for anxiety, this appears to be under good control at current time 5. Neuropsych: This patient is capable of making decisions on her own behalf.  6. Skin/Wound Care: Routine skin checks  7. Fluids/Electrolytes/Nutrition:    -dietary intake is poor. now on D3 and thin.    -hypokalemia due to Lasix: 3.2 on 2/26,3.4 on 3/1 will increase potassium 20 mEq to 3times a day    -cont megace  8. Tracheostomy 07/25/2017. Downsized to #4 cuffless 08/08/2017. No current plan to decannulate due to failure with capping. Follow-up per Dr. Nelda Marseille.    -Continue nebulizers, trach care and suction   -Guaifenesin to help with secretions   -continue with PMV    -CONTINUE CAPPING TRIALS during day.  Anxiety is an issue    -Hopefully tolerates better on higher dose of BuSpar 9. Atrial fibrillation. Amiodarone 200 mg daily, ASA 81mg . Cardiac rate controlled  10. Dysphagia. Advanced to D3/thins   CXR reviewed, stable 11. Acute diastolic congestive heart failure. Lasix 40 mg daily, Aldactone 25 mg daily. Monitor for any signs of fluid overload    -continue current diuretics.  BUN stable --recheck Monday Filed Weights   08/23/17 0420 08/24/17 0500 08/25/17 0425  Weight: 71 kg (156 lb 8.4 oz) 70.4 kg (155 lb 3.3 oz) 72 kg (158 lb 11.7 oz)   12. Hypothyroidism. Synthroid   LOS (Days) 13 A FACE TO FACE EVALUATION WAS PERFORMED  Charlett Blake, MD 08/25/2017 8:42 AM

## 2017-08-25 NOTE — Progress Notes (Signed)
Physical Therapy Session Note  Patient Details  Name: [name] MRN: [id number] Date of Birth: [birth year]  Today's Date: 08/25/2017 PT Individual Time: 1415-1500 PT Individual Time Calculation (min): 45 min   Short Term Goals: Week 2:  PT Short Term Goal 1 (Week 2): Pt will ambulate x 11ft using RW with mod assist PT Short Term Goal 2 (Week 2): Pt will perform sit<>stand with consistent minA while maintaining sternal precuations PT Short Term Goal 3 (Week 2): Pt will perform stand pivot transfer with min assist and use of RW  Skilled Therapeutic Interventions/Progress Updates:    Pt supine in bed upon therapist arrival. Pt reports no pain and requires max encouragement to participate in therapy session. Pt reports feeling very fatigued this PM but is agreeable to participate in bed level exercises. Supine BLE therex 2 x 10 reps. Supine B gastroc and HS stretches 3 x 30 sec each. Pt left supine in bed with needs in reach and bed alarm in place, husband present.  Therapy Documentation Precautions:  Precautions Precautions: Fall, Sternal Precaution Comments: Reinforced sternal precautions Restrictions Weight Bearing Restrictions: (sternal precautions) Other Position/Activity Restrictions: sternal precautions  See Function Navigator for Current Functional Status.   Therapy/Group: Individual Therapy  Excell Seltzer, PT, DPT  08/25/2017, 3:44 PM

## 2017-08-26 ENCOUNTER — Inpatient Hospital Stay (HOSPITAL_COMMUNITY): Payer: Medicare Other

## 2017-08-26 MED ORDER — POTASSIUM CHLORIDE 20 MEQ PO PACK
20.0000 meq | PACK | Freq: Three times a day (TID) | ORAL | Status: DC
  Administered 2017-08-26 – 2017-09-02 (×20): 20 meq via ORAL
  Filled 2017-08-26 (×21): qty 1

## 2017-08-26 NOTE — Progress Notes (Signed)
Attempted to red cap trach, pt did not tolerate, RR increased and pt anxious. SpO2 decreased to 94%. Placed pt back on PMV, on room and and spo2 increased back to 100%. Pt stable at this time and more comfortable.

## 2017-08-26 NOTE — Progress Notes (Signed)
[facility]     PROGRESS NOTE    Subjective/Complaints: Issues overnight.  We discussed trach capping.  She thinks she can tolerate this at night, not sure whether she would be tolerate this during the day due to anxiety  ROS: +DOE. Denies nausea, vomiting, diarrhea, shortness of breath or chest pain   Objective: Vital Signs:  Blood pressure (!) 116/99, pulse 92, temperature 98.3 F (36.8 C), temperature source Oral, resp. rate 18, height 5\' 5"  (1.651 m), weight 71.5 kg (157 lb 10.1 oz), SpO2 98 %. Vas [place] Lower Extremity Venous (dvt)  Result Date: 08/15/2017  Lower Venous Study Indication: Edema. Risk Factors: Surgery aortic aneurysm repair. Examination Guidelines: A complete evaluation includes B-mode imaging, spectral doppler, color doppler, and power doppler as needed of all accessible portions of each vessel. Bilateral testing is considered an integral part of a complete examination. Limited examinations for reoccurring indications may be performed as noted. The reflux portion of the exam is performed with the patient in reverse Trendelenburg.  Right Venous Findings: +---------+---------------+---------+-----------+----------+-----------------+          CompressibilityPhasicitySpontaneityPropertiesSummary           +---------+---------------+---------+-----------+----------+-----------------+ CFV      Full           Yes      Yes                                    +---------+---------------+---------+-----------+----------+-----------------+ FV Prox  Full           Yes      Yes                                    +---------+---------------+---------+-----------+----------+-----------------+ FV Mid   Full           Yes      Yes                                    +---------+---------------+---------+-----------+----------+-----------------+ FV DistalFull           Yes      Yes                                     +---------+---------------+---------+-----------+----------+-----------------+ PFV      Full           Yes      Yes                                    +---------+---------------+---------+-----------+----------+-----------------+ POP      Full           Yes      Yes                                    +---------+---------------+---------+-----------+----------+-----------------+ PTV      Full                                                           +---------+---------------+---------+-----------+----------+-----------------+  PERO     Full                                                           +---------+---------------+---------+-----------+----------+-----------------+ GSV      Partial                                      Age Indeterminate +---------+---------------+---------+-----------+----------+-----------------+  Left Venous Findings: +---------+---------------+---------+-----------+----------+-------+          CompressibilityPhasicitySpontaneityPropertiesSummary +---------+---------------+---------+-----------+----------+-------+ CFV      Full           Yes      Yes                          +---------+---------------+---------+-----------+----------+-------+ FV Prox  Full           Yes      Yes                          +---------+---------------+---------+-----------+----------+-------+ FV Mid   Full           Yes      Yes                          +---------+---------------+---------+-----------+----------+-------+ FV DistalFull           Yes      Yes                          +---------+---------------+---------+-----------+----------+-------+ PFV      Full           Yes      Yes                          +---------+---------------+---------+-----------+----------+-------+ POP      Full           Yes      Yes                          +---------+---------------+---------+-----------+----------+-------+ PTV      Full                                                  +---------+---------------+---------+-----------+----------+-------+ PERO     Full                                                 +---------+---------------+---------+-----------+----------+-------+ GSV      Full                                                 +---------+---------------+---------+-----------+----------+-------+    Final Interpretation: Right: There is no evidence of deep vein thrombosis  in the lower extremity. Positive for an age interminate superficial thrombosis of the greater saphenous vein just distal to the confluence with the saphenofemoral juction. There is no evidence of a Baker's cyst. Left: There is no evidence of deep vein thrombosis in the lower extremity.There is no evidence of superficial venous thrombosis. There is no evidence of a Baker's cyst.  *See table(s) above for measurements and observations. Electronically signed by Deitra Mayo on 08/15/2017 at 4:50:03 PM.    No results for input(s): WBC, HGB, HCT, PLT in the last 72 hours. Recent Labs    08/25/17 0624  NA 137  K 3.4*  CL 96*  GLUCOSE 97  BUN 14  CREATININE 0.76  CALCIUM 8.1*   CBG (last 3)  No results for input(s): GLUCAP in the last 72 hours.  Wt Readings from Last 3 Encounters:  08/26/17 71.5 kg (157 lb 10.1 oz)  08/12/17 73.8 kg (162 lb 11.2 oz)  07/06/17 76.5 kg (168 lb 11.2 oz)    Physical Exam:  HENT: Normocephalic. Atraumatic.  Eyes: EOMI. No dischage.  Neck: Trach tube in place with PSV.  Cardiac: RRR. No JVD   Respiratory: Stridor and wheezes. Unlabored at rest GI: Bowel sounds are normal. She exhibits no distension.  Musculoskeletal: She exhibits no tenderness or deformity.  Neurological: Alert Makes good eye contact with examiner.  Followed full commands.  Motor: B/l UE 4-/5 prox to distal.  B/l LE:   2+/5 HF, 3-/5 KE and 3+/5 ADF/PF.  LUE tremor. Psychiatric: pleasant  Assessment/Plan: 1.  Functional  deficits secondary to debility which require 3+ hours per day of interdisciplinary therapy in a comprehensive inpatient rehab setting. Physiatrist is providing close team supervision and 24 hour management of active medical problems listed below. Physiatrist and rehab team continue to assess barriers to discharge/monitor patient progress toward functional and medical goals.  Function:  Bathing Bathing position Bathing activity did not occur: (per nursing only peri care done, patient refuses bathing ) Position: Wheelchair/chair at sink  Bathing parts Body parts bathed by patient: Right arm, Left arm, Abdomen, Front perineal area, Right upper leg, Left upper leg Body parts bathed by helper: Buttocks, Right lower leg, Left lower leg, Back  Bathing assist Assist Level: Touching or steadying assistance(Pt > 75%)      Upper Body Dressing/Undressing Upper body dressing   What is the patient wearing?: Pull over shirt/dress     Pull over shirt/dress - Perfomed by patient: Thread/unthread right sleeve, Thread/unthread left sleeve, Put head through opening Pull over shirt/dress - Perfomed by helper: Pull shirt over trunk Button up shirt - Perfomed by patient: Thread/unthread right sleeve, Thread/unthread left sleeve Button up shirt - Perfomed by helper: Thread/unthread right sleeve, Thread/unthread left sleeve, Pull shirt around back    Upper body assist Assist Level: Supervision or verbal cues      Lower Body Dressing/Undressing Lower body dressing   What is the patient wearing?: Underwear, Pants, [employer], Shoes Underwear - Performed by patient: Pull underwear up/down Underwear - Performed by helper: Thread/unthread right underwear leg, Thread/unthread left underwear leg Pants- Performed by patient: Pull pants up/down Pants- Performed by helper: Thread/unthread right pants leg, Thread/unthread left pants leg Non-skid slipper socks- Performed by patient: Don/doff left sock Non-skid slipper  socks- Performed by helper: Don/doff right sock, Don/doff left sock   Socks - Performed by helper: Don/doff right sock, Don/doff left sock   Shoes - Performed by helper: Don/doff right shoe, Don/doff left shoe, Fasten right, Fasten left  TED Hose - Performed by helper: Don/doff right TED hose, Don/doff left TED hose  Lower body assist Assist for lower body dressing: Touching or steadying assistance (Pt > 75%)      Toileting Toileting Toileting activity did not occur: No continent bowel/bladder event Toileting steps completed by patient: Adjust clothing prior to toileting, Performs perineal hygiene, Adjust clothing after toileting Toileting steps completed by helper: Adjust clothing after toileting Toileting Assistive Devices: Grab bar or rail  Toileting assist Assist level: Touching or steadying assistance (Pt.75%)   Transfers Chair/bed transfer   Chair/bed transfer method: Stand pivot Chair/bed transfer assist level: Touching or steadying assistance (Pt > 75%) Chair/bed transfer assistive device: Medical sales representative     Max distance: 85 ft Assist level: Touching or steadying assistance (Pt > 75%)   Wheelchair Wheelchair activity did not occur: Safety/medical concerns Type: Manual   Assist Level: Dependent (Pt equals 0%)  Cognition Comprehension Comprehension assist level: Follows basic conversation/direction with no assist  Expression Expression assist level: Expresses basic needs/ideas: With no assist  Social Interaction Social Interaction assist level: Interacts appropriately with others with medication or extra time (anti-anxiety, antidepressant).  Problem Solving Problem solving assist level: Solves basic 75 - 89% of the time/requires cueing 10 - 24% of the time  Memory Memory assist level: Recognizes or recalls 50 - 74% of the time/requires cueing 25 - 49% of the time  Medical Problem List and Plan:  1. Debility secondary to debility related to  abdominal aneurysm/AS with AVR 07/10/2017 status post repair and subsequent cardiogenic shock    Cont CIR PT OT speech   2. DVT Prophylaxis/Anticoagulation: Subcutaneous Lovenox. Dopplers negative 3. Pain Management: Tylenol as needed  4. Mood: Klonopin 1 mg daily at bedtime, Seroquel 75 mg bedtime, Effexor 37.5 mg twice a day  BuSpar during the day   -Klonopin as needed during the day for anxiety, this appears to be under good control at current time 5. Neuropsych: This patient is capable of making decisions on her own behalf.  6. Skin/Wound Care: Routine skin checks  7. Fluids/Electrolytes/Nutrition: Fluid intake 870 mL on 08/25/2017    -dietary intake is poor. now on D3 and thin.    -hypokalemia due to Lasix: 3.2 on 2/26,3.4 on 3/1 will increase potassium 20 mEq to 3times a day, recheck next week    -cont megace  8. Tracheostomy 07/25/2017. Downsized to #4 cuffless 08/08/2017. No current plan to decannulate due to failure with capping. Follow-up per Dr. Nelda Marseille.    -Continue nebulizers, trach care and suction   -Guaifenesin to help with secretions   -continue with PMV    -CONTINUE CAPPING TRIALS   Anxiety is an issue     9. Atrial fibrillation. Amiodarone 200 mg daily, ASA 81mg . Cardiac rate controlled  10. Dysphagia. Advanced to D3/thins   CXR reviewed, stable 11. Acute diastolic congestive heart failure. Lasix 40 mg daily, Aldactone 25 mg daily. Monitor for any signs of fluid overload    -continue current diuretics.  BUN stable --recheck Monday Filed Weights   08/24/17 0500 08/25/17 0425 08/26/17 0410  Weight: 70.4 kg (155 lb 3.3 oz) 72 kg (158 lb 11.7 oz) 71.5 kg (157 lb 10.1 oz)   12. Hypothyroidism. Synthroid   LOS (Days) [place] EVALUATION WAS PERFORMED  Charlett Blake, MD 08/26/2017 11:57 AM

## 2017-08-26 NOTE — Progress Notes (Signed)
Pt is sleeping comfortably at this time. Pt stated that she didn't want to use the ATC throughout the night when ask earlier in the shift. No distress or complications noted at this time. SATs are stable on cont pulse ox.

## 2017-08-26 NOTE — Progress Notes (Signed)
RN was called to room patient was coughing continously; saturation 78%. RN took out the red plug thick secretions came out of the trach. Saturation came back to 94% . Per patient she was sleeping and something is on her throat and can't get it out. Patient was anxious. Patient was able to use the red plug for almost 2 hours. RT came to check on the patient Patient felt better no more coughing noted. Patient was placed back on PMV by RT Continued to monitor.

## 2017-08-26 NOTE — Progress Notes (Signed)
Physical Therapy Session Note  Patient Details  Name: [name] MRN: [id number] Date of Birth: August 27, 1940  Today's Date: 08/26/2017 PT Individual Time: 0831-0929 PT Individual Time Calculation (min): 58 min   Short Term Goals: Week 2:  PT Short Term Goal 1 (Week 2): Pt will ambulate x 73ft using RW with mod assist PT Short Term Goal 2 (Week 2): Pt will perform sit<>stand with consistent minA while maintaining sternal precuations PT Short Term Goal 3 (Week 2): Pt will perform stand pivot transfer with min assist and use of RW  Skilled Therapeutic Interventions/Progress Updates:    Pt supine in bed upon PT arrival, agreeable to therapy tx and denies pain. Pt requesting to wash up prior to starting therapy. Pt transferred from supine>sitting with min assist and verbal cues for precautions. Pt worked on sitting balance while washing body with wet washcloth, requiring assist to wash backside. Pt performed sit<>stand with min assist in order to clean peri area. Pt donned clothing with assist to loop LEs through pants, performed upper body dressing with supervision, performed sit<>stand min assist to pull pants over hips. Pt ambulated from bed>sink and worked on dynamic standing balance while brushing teeth and brushing hair, stand by assist. Pt ambulated into bathroom with RW and min assist, performed all toileting steps with supervision. Pt ambulated from bathroom>sink with min assist using RW, worked on dynamic standing balance and standing tolerance in order to wash hands. Pt transported to dayroom and ambulated x 85 ft with RW and min assist working on activity tolerance and endurance. Pt left seated in w/c at end of session, needs in reach.   Therapy Documentation Precautions:  Precautions Precautions: Fall, Sternal Precaution Comments: Reinforced sternal precautions Restrictions Weight Bearing Restrictions: (sternal precautions) Other Position/Activity Restrictions: sternal  precautions   See Function Navigator for Current Functional Status.   Therapy/Group: Individual Therapy  Netta Corrigan, PT, DPT 08/26/2017, 7:19 AM

## 2017-08-26 NOTE — Progress Notes (Signed)
Occupational Therapy Session Note  Patient Details  Name: [name] MRN: [id number] Date of Birth: June 09, 1941  Today's Date: 08/26/2017 OT Individual Time: 1100-1127 OT Individual Time Calculation (min): 27 min     Skilled Therapeutic Interventions/Progress Updates:    1:1. Pt uses memory book to recall events of therapy in previous sessions and records what happened at end of session. OT propels w/c to/from ADL apartment for energy conservation. Pt instructed in standing pushing from only legs with min A for steadying and reaches crossing midline to retrieve cups from cabinets/return cup into cabinets with CGA for balance. Exited session with pt seated in w/c, husband present and call light in reach.  Therapy Documentation Precautions:  Precautions Precautions: Fall, Sternal Precaution Comments: Reinforced sternal precautions Restrictions Weight Bearing Restrictions: (sternal precautions) Other Position/Activity Restrictions: sternal precautions  See Function Navigator for Current Functional Status.   Therapy/Group: Individual Therapy  Tonny Branch 08/26/2017, 11:28 AM

## 2017-08-27 ENCOUNTER — Inpatient Hospital Stay (HOSPITAL_COMMUNITY): Payer: Medicare Other | Admitting: Occupational Therapy

## 2017-08-27 MED ORDER — ALPRAZOLAM 0.25 MG PO TABS
0.2500 mg | ORAL_TABLET | Freq: Three times a day (TID) | ORAL | Status: DC | PRN
  Administered 2017-08-27 – 2017-09-02 (×13): 0.25 mg via ORAL
  Filled 2017-08-27 (×13): qty 1

## 2017-08-27 MED ORDER — CLONAZEPAM 0.5 MG PO TBDP: 1.0000 mg | ORAL_TABLET | Freq: Two times a day (BID) | ORAL | Status: DC | PRN

## 2017-08-27 MED ORDER — BUSPIRONE HCL 5 MG PO TABS
10.0000 mg | ORAL_TABLET | Freq: Every day | ORAL | Status: DC
  Administered 2017-08-27 – 2017-08-30 (×4): 10 mg via ORAL
  Filled 2017-08-27 (×3): qty 2

## 2017-08-27 MED ORDER — BUSPIRONE HCL 5 MG PO TABS: 10.0000 mg | ORAL_TABLET | Freq: Every day | ORAL | Status: DC

## 2017-08-27 NOTE — Progress Notes (Signed)
Occupational Therapy Session Note  Patient Details  Name: [name] MRN: [id number] Date of Birth: October 22, 1940  Today's Date: 08/27/2017 OT Individual Time: 4097-3532 OT Individual Time Calculation (min): 28 min   Short Term Goals: Week 2:  OT Short Term Goal 1 (Week 2): Pt will recall and utilize sternal pre-cautions during functional tasks with mod A OT Short Term Goal 2 (Week 2): Pt will perform toileting tasks with steady A OT Short Term Goal 3 (Week 2): Pt will perform toilet transfers with steady A OT Short Term Goal 4 (Week 2): Pt will complete LB dressing tasks with min A  Skilled Therapeutic Interventions/Progress Updates:     Pt greeted supine in bed with visitors present. No c/o pain and agreeable to session. Worked on standing balance/tolerance and basic problem solving while she completed puzzle with vertical pieces. Pt requiring unilateral support on RW and max vcs to finish correctly with steady assist. 02 sats throughout task <93%. Longest standing window without rest 9 minutes. Afterwards we wrote down therapy events in memory journal with min vcs, but max vcs for orientation to time.   Therapy Documentation Precautions:  Precautions Precautions: Fall, Sternal Precaution Comments: Reinforced sternal precautions Restrictions Weight Bearing Restrictions: No Other Position/Activity Restrictions: sternal precautions Vital Signs: Therapy Vitals Temp: 98.2 F (36.8 C) Temp Source: Oral Pulse Rate: 94 Resp: 16 BP: 125/68 Patient Position (if appropriate): Sitting Oxygen Therapy SpO2: 100 % O2 Device: Room Air     See Function Navigator for Current Functional Status.   Therapy/Group: Individual Therapy  Katharina Jehle A Lilia Letterman 08/27/2017, 4:24 PM

## 2017-08-27 NOTE — Progress Notes (Signed)
[facility]IT[facility]     PROGRESS NOTE    Subjective/Complaints: Per RN tolerated trach capping for ~2hours yesterday, reviewed RRT note, had some secretions as well, discussed with RRT and RN  ROS: +DOE. Denies nausea, vomiting, diarrhea, shortness of breath or chest pain   Objective: Vital Signs:  Blood pressure 116/64, pulse 90, temperature 97.9 F (36.6 C), temperature source Oral, resp. rate 18, height 5\' 5"  (1.651 m), weight 71.4 kg (157 lb 6.5 oz), SpO2 95 %. Vas [place] Lower Extremity Venous (dvt)  Result Date: 08/15/2017  Lower Venous Study Indication: Edema. Risk Factors: Surgery aortic aneurysm repair. Examination Guidelines: A complete evaluation includes B-mode imaging, spectral doppler, color doppler, and power doppler as needed of all accessible portions of each vessel. Bilateral testing is considered an integral part of a complete examination. Limited examinations for reoccurring indications may be performed as noted. The reflux portion of the exam is performed with the patient in reverse Trendelenburg.  Right Venous Findings: +---------+---------------+---------+-----------+----------+-----------------+          CompressibilityPhasicitySpontaneityPropertiesSummary           +---------+---------------+---------+-----------+----------+-----------------+ CFV      Full           Yes      Yes                                    +---------+---------------+---------+-----------+----------+-----------------+ FV Prox  Full           Yes      Yes                                    +---------+---------------+---------+-----------+----------+-----------------+ FV Mid   Full           Yes      Yes                                    +---------+---------------+---------+-----------+----------+-----------------+ FV DistalFull           Yes      Yes                                     +---------+---------------+---------+-----------+----------+-----------------+ PFV      Full           Yes      Yes                                    +---------+---------------+---------+-----------+----------+-----------------+ POP      Full           Yes      Yes                                    +---------+---------------+---------+-----------+----------+-----------------+ PTV      Full                                                           +---------+---------------+---------+-----------+----------+-----------------+  PERO     Full                                                           +---------+---------------+---------+-----------+----------+-----------------+ GSV      Partial                                      Age Indeterminate +---------+---------------+---------+-----------+----------+-----------------+  Left Venous Findings: +---------+---------------+---------+-----------+----------+-------+          CompressibilityPhasicitySpontaneityPropertiesSummary +---------+---------------+---------+-----------+----------+-------+ CFV      Full           Yes      Yes                          +---------+---------------+---------+-----------+----------+-------+ FV Prox  Full           Yes      Yes                          +---------+---------------+---------+-----------+----------+-------+ FV Mid   Full           Yes      Yes                          +---------+---------------+---------+-----------+----------+-------+ FV DistalFull           Yes      Yes                          +---------+---------------+---------+-----------+----------+-------+ PFV      Full           Yes      Yes                          +---------+---------------+---------+-----------+----------+-------+ POP      Full           Yes      Yes                          +---------+---------------+---------+-----------+----------+-------+ PTV      Full                                                  +---------+---------------+---------+-----------+----------+-------+ PERO     Full                                                 +---------+---------------+---------+-----------+----------+-------+ GSV      Full                                                 +---------+---------------+---------+-----------+----------+-------+    Final Interpretation: Right: There is no evidence of deep vein thrombosis  in the lower extremity. Positive for an age interminate superficial thrombosis of the greater saphenous vein just distal to the confluence with the saphenofemoral juction. There is no evidence of a Baker's cyst. Left: There is no evidence of deep vein thrombosis in the lower extremity.There is no evidence of superficial venous thrombosis. There is no evidence of a Baker's cyst.  *See table(s) above for measurements and observations. Electronically signed by Deitra Mayo on 08/15/2017 at 4:50:03 PM.    No results for input(s): WBC, HGB, HCT, PLT in the last 72 hours. Recent Labs    08/25/17 0624  NA 137  K 3.4*  CL 96*  GLUCOSE 97  BUN 14  CREATININE 0.76  CALCIUM 8.1*   CBG (last 3)  No results for input(s): GLUCAP in the last 72 hours.  Wt Readings from Last 3 Encounters:  08/27/17 71.4 kg (157 lb 6.5 oz)  08/12/17 73.8 kg (162 lb 11.2 oz)  07/06/17 76.5 kg (168 lb 11.2 oz)    Physical Exam:  HENT: Normocephalic. Atraumatic.  Eyes: EOMI. No dischage.  Neck: Trach tube in place with PSV.  Cardiac: RRR. No JVD   Respiratory: Stridor and wheezes. Unlabored at rest GI: Bowel sounds are normal. She exhibits no distension.  Musculoskeletal: She exhibits no tenderness or deformity.  Neurological: Alert Makes good eye contact with examiner.  Followed full commands.  Motor: B/l UE 4-/5 prox to distal.  B/l LE:   2+/5 HF, 3-/5 KE and 3+/5 ADF/PF.  LUE tremor. Psychiatric: pleasant  Assessment/Plan: 1.  Functional  deficits secondary to debility which require 3+ hours per day of interdisciplinary therapy in a comprehensive inpatient rehab setting. Physiatrist is providing close team supervision and 24 hour management of active medical problems listed below. Physiatrist and rehab team continue to assess barriers to discharge/monitor patient progress toward functional and medical goals.  Function:  Bathing Bathing position Bathing activity did not occur: (per nursing only peri care done, patient refuses bathing ) Position: Wheelchair/chair at sink  Bathing parts Body parts bathed by patient: Right arm, Left arm, Abdomen, Front perineal area, Right upper leg, Left upper leg Body parts bathed by helper: Buttocks, Right lower leg, Left lower leg, Back  Bathing assist Assist Level: Touching or steadying assistance(Pt > 75%)      Upper Body Dressing/Undressing Upper body dressing   What is the patient wearing?: Pull over shirt/dress     Pull over shirt/dress - Perfomed by patient: Thread/unthread right sleeve, Thread/unthread left sleeve, Put head through opening Pull over shirt/dress - Perfomed by helper: Pull shirt over trunk Button up shirt - Perfomed by patient: Thread/unthread right sleeve, Thread/unthread left sleeve Button up shirt - Perfomed by helper: Thread/unthread right sleeve, Thread/unthread left sleeve, Pull shirt around back    Upper body assist Assist Level: Supervision or verbal cues      Lower Body Dressing/Undressing Lower body dressing   What is the patient wearing?: Underwear, Pants, [employer], Shoes Underwear - Performed by patient: Pull underwear up/down Underwear - Performed by helper: Thread/unthread right underwear leg, Thread/unthread left underwear leg Pants- Performed by patient: Pull pants up/down Pants- Performed by helper: Thread/unthread right pants leg, Thread/unthread left pants leg Non-skid slipper socks- Performed by patient: Don/doff left sock Non-skid slipper  socks- Performed by helper: Don/doff right sock, Don/doff left sock   Socks - Performed by helper: Don/doff right sock, Don/doff left sock   Shoes - Performed by helper: Don/doff right shoe, Don/doff left shoe, Fasten right, Fasten left  TED Hose - Performed by helper: Don/doff right TED hose, Don/doff left TED hose  Lower body assist Assist for lower body dressing: Touching or steadying assistance (Pt > 75%)      Toileting Toileting Toileting activity did not occur: No continent bowel/bladder event Toileting steps completed by patient: Adjust clothing prior to toileting, Performs perineal hygiene, Adjust clothing after toileting Toileting steps completed by helper: Adjust clothing prior to toileting, Performs perineal hygiene, Adjust clothing after toileting Toileting Assistive Devices: Grab bar or rail  Toileting assist Assist level: Two helpers   Transfers Chair/bed transfer   Chair/bed transfer method: Stand pivot Chair/bed transfer assist level: Touching or steadying assistance (Pt > 75%) Chair/bed transfer assistive device: Medical sales representative     Max distance: 22 ft Assist level: Touching or steadying assistance (Pt > 75%)   Wheelchair Wheelchair activity did not occur: Safety/medical concerns Type: Manual   Assist Level: Dependent (Pt equals 0%)  Cognition Comprehension Comprehension assist level: Understands basic 90% of the time/cues < 10% of the time  Expression Expression assist level: Expresses basic 90% of the time/requires cueing < 10% of the time.  Social Interaction Social Interaction assist level: Interacts appropriately 90% of the time - Needs monitoring or encouragement for participation or interaction.  Problem Solving Problem solving assist level: Solves complex 90% of the time/cues < 10% of the time  Memory Memory assist level: Recognizes or recalls 90% of the time/requires cueing < 10% of the time  Medical Problem List and Plan:  1.  Debility secondary to debility related to abdominal aneurysm/AS with AVR 07/10/2017 status post repair and subsequent cardiogenic shock    Cont CIR PT OT speech   2. DVT Prophylaxis/Anticoagulation: Subcutaneous Lovenox. Dopplers negative 3. Pain Management: Tylenol as needed  4. Mood: Klonopin 1 mg daily at bedtime, Seroquel 75 mg bedtime, Effexor 37.5 mg twice a day  BuSpar during the day, will increase dose to 10mg    -Klonopin as needed during the day for anxiety, this appears to be under good control at current time 5. Neuropsych: This patient is capable of making decisions on her own behalf.  6. Skin/Wound Care: Routine skin checks  7. Fluids/Electrolytes/Nutrition: Fluid intake 240 mL on 08/26/2017    -dietary intake is poor. now on D3 and thin.    -hypokalemia due to Lasix: 3.2 on 2/26,3.4 on 3/1 will increase potassium 20 mEq to 3times a day, recheck in am    -cont megace  8. Tracheostomy 07/25/2017. Downsized to #4 cuffless 08/08/2017. No current plan to decannulate due to failure with capping. Follow-up per Dr. Nelda Marseille.    -Continue nebulizers, trach care and suction   -Guaifenesin to help with secretions   -continue with PMV    -CONTINUE CAPPING TRIALS   Anxiety is an issue, change klonopin to xanax .25mg      9. Atrial fibrillation. Amiodarone 200 mg daily, ASA 81mg . Cardiac rate controlled  10. Dysphagia. Advanced to D3/thins   CXR reviewed, stable 11. Acute diastolic congestive heart failure. Lasix 40 mg daily, Aldactone 25 mg daily. Monitor for any signs of fluid overload    -continue current diuretics.  BUN stable --recheck Monday Filed Weights   08/25/17 0425 08/26/17 0410 08/27/17 0549  Weight: 72 kg (158 lb 11.7 oz) 71.5 kg (157 lb 10.1 oz) 71.4 kg (157 lb 6.5 oz)   12. Hypothyroidism. Synthroid   LOS (Days) 15 A FACE TO FACE EVALUATION WAS PERFORMED  Charlett Blake, MD 08/27/2017 8:05  AM

## 2017-08-27 NOTE — Progress Notes (Signed)
Pt is having some anxiety with tracheal CAPPED trials. Pt is stable at this time. Positive reinforcement given to patient that patient is doing well bedside cont pulse ox saturations are stable at 97% on RA. BBS have good air exchange with some faint exp wheezing. RT will monitor patient throughout the night.  Ona Rathert L. Jennette Kettle, RRT, RCP

## 2017-08-27 NOTE — Progress Notes (Signed)
Due to significant levels of concern, fearfulness, trepidation, and anxiety patient is unable to tolerate CAPPED tracheal trials throughout the night. This RRT came to the room several times between now and shift change using positive reinforcement to assure patient that she can breath just find. Saturations are stable. BBS have good air movement. Pt appears very nervous and on edge about worrying what might happen if she falls to sleep with the "cap on" her trach flange. Nothing seems to assure patient. Pt gets worked up and starts shaking. So this RRT/RN decided to in order to alleviate these feelings that the patient is experiencing Cap to be removed and replaced with the inner cannula. So Cap is now removed. Patient is aware of the goal of short term tolerance to cap leading to decannulation. Pt doesn't want the cap. RN aware. Pt is stable on RA.   Traylon Schimming L. Jennette Kettle, RRT, RCP

## 2017-08-28 ENCOUNTER — Inpatient Hospital Stay (HOSPITAL_COMMUNITY): Payer: Medicare Other | Admitting: Speech Pathology

## 2017-08-28 ENCOUNTER — Inpatient Hospital Stay (HOSPITAL_COMMUNITY): Payer: Medicare Other | Admitting: Physical Therapy

## 2017-08-28 ENCOUNTER — Inpatient Hospital Stay (HOSPITAL_COMMUNITY): Payer: Medicare Other

## 2017-08-28 ENCOUNTER — Inpatient Hospital Stay (HOSPITAL_COMMUNITY): Payer: Medicare Other | Admitting: Occupational Therapy

## 2017-08-28 LAB — BASIC METABOLIC PANEL
Anion gap: 12 (ref 5–15)
BUN: 16 mg/dL (ref 6–20)
CHLORIDE: 99 mmol/L — AB (ref 101–111)
CO2: 28 mmol/L (ref 22–32)
CREATININE: 0.69 mg/dL (ref 0.44–1.00)
Calcium: 8.7 mg/dL — ABNORMAL LOW (ref 8.9–10.3)
GFR calc Af Amer: >60 mL/min (ref >60)
Glucose, Bld: 106 mg/dL — ABNORMAL HIGH (ref 65–99)
Potassium: 4.4 mmol/L (ref 3.5–5.1)
SODIUM: 139 mmol/L (ref 135–145)

## 2017-08-28 MED ORDER — SENNOSIDES-DOCUSATE SODIUM 8.6-50 MG PO TABS
2.0000 | ORAL_TABLET | Freq: Two times a day (BID) | ORAL | Status: DC
  Administered 2017-08-28 – 2017-09-02 (×10): 2 via ORAL
  Filled 2017-08-28 (×10): qty 2

## 2017-08-28 MED ORDER — PREMIER PROTEIN SHAKE
11.0000 [oz_av] | Freq: Two times a day (BID) | ORAL | Status: DC
  Administered 2017-08-29 – 2017-09-02 (×8): 11 [oz_av] via ORAL
  Filled 2017-08-28 (×14): qty 325.31

## 2017-08-28 MED ORDER — PREMIER PROTEIN SHAKE
11.0000 [oz_av] | Freq: Three times a day (TID) | ORAL | Status: DC
  Filled 2017-08-28 (×3): qty 325.31

## 2017-08-28 MED ORDER — POLYETHYLENE GLYCOL 3350 17 G PO PACK
17.0000 g | PACK | Freq: Once | ORAL | Status: AC
Start: 2017-08-28 — End: 2017-08-28
  Administered 2017-08-28: 17 g via ORAL
  Filled 2017-08-28: qty 1

## 2017-08-28 NOTE — Progress Notes (Signed)
Occupational Therapy Session Note  Patient Details  Name: [name] MRN: [id number] Date of Birth: 25-Feb-1941  Today's Date: 08/28/2017 OT Individual Time: 1015-1100 OT Individual Time Calculation (min): 45 min    Short Term Goals: Week 2:  OT Short Term Goal 1 (Week 2): Pt will recall and utilize sternal pre-cautions during functional tasks with mod A OT Short Term Goal 2 (Week 2): Pt will perform toileting tasks with steady A OT Short Term Goal 3 (Week 2): Pt will perform toilet transfers with steady A OT Short Term Goal 4 (Week 2): Pt will complete LB dressing tasks with min A  Skilled Therapeutic Interventions/Progress Updates:    Pt received sitting on toilet with NT in room with her.  Pt then completed toileting, requesting ducolax from RN, stood with S and cleansed self and pulled up clothing with close S with one hand on bar.  Pt needed a few rest breaks as she was literally tremoring from anxiety.  Spent time counseling with pt about the events last night.  Pt stated she was so fearful of sleeping at night as her trach was  capped earlier in the day.  She was alone in the room.  Recommended pt have her husband or dtr stay with her during the night the next time capping is attempted.  Pt discussed her anxiety for some time and therapist facilitate discussion on relaxation techniques and reassurance. Pt washed hands at sink and then used RW to transfer back to bed with steadying A with verbal cues to keep the walker closer to her.  Pt layed down and worked on bridging to push self higher in bed with therapist adjusting her with pad.  Pt resting in bed comfortably with bed alarm set and all needs met.  Therapy Documentation Precautions:  Precautions Precautions: Fall, Sternal Precaution Comments: Reinforced sternal precautions Restrictions Weight Bearing Restrictions: No Other Position/Activity Restrictions: sternal precautions    Vital Signs: Therapy Vitals Pulse Rate:  96 Resp: 18 Patient Position (if appropriate): Lying Oxygen Therapy SpO2: 98 % O2 Device: Room Air Pain: Pain Assessment Pain Assessment: No/denies pain ADL:      See Function Navigator for Current Functional Status.   Therapy/Group: Individual Therapy  [place] 08/28/2017, 12:20 PM

## 2017-08-28 NOTE — Progress Notes (Addendum)
Physical Therapy Note  Patient Details  Name: MCKYNLIE VANDERSLICE MRN: [id number] Date of Birth: December 28, 1940 Today's Date: 08/28/2017  1422-1522, 60 min Pain: None per pt  Erin, NT finishing up hygiene change for pt.  With bed in Trendelenburg position, +2 needed for scooting pt to [facility] while she bridged hips up.  O2 sats on room air 96% , down to 93% with activity, HR 101-111 with activity.    Pt stated that her trach was downsized last night, which was difficult for her, and she has been shaking ever since.  She stated that she just received med for anxiety, and did not feel she could leave room.      Pt noted to be tremulous x 4 extremities.  VCs for sternal precautions during bed mobility to sit EOB .  Seated wt shifting L><R without use of UEs for bed mobility training, x 5 each side, and forward without use of UEs, to elevate hips slightly with assistance, off of raised mat x 5.  Sit< stand to RW observing sternal precautions with min cues, mod assist.    neuromuscular re-education via forced use, mulitmodal cues for sustained stretch bil heel cords and hamstrings in sitting and standing on wedge, with RW x  30 seconds, x 60 seconds, x 60 seconds,  Pt's tight HCs and HS muscles contribute to LOB backwards upon standing, which did decrease with practice and LE stretching during session    Pt able to use relaxation techniques to slow breathing and HR during activity.  She needed seated rest breaks throughout session.  Pt left resting in bed with alarm set and needs at hand; husband present  See function navigator for current status.  Zhamir Pirro 08/28/2017, 2:47 PM

## 2017-08-28 NOTE — Progress Notes (Signed)
Patient very anxious and fearful with CAPPED trach, her O2 sat was stable, this RN tried multiple positive reinforcement but patient insist she can't go to sleep with the CAPPED trach. This RN notified RRT and the CAPPED trach was remove and inner cannula was inserted. Patient was able to go to sleep. We continue to monitor.

## 2017-08-28 NOTE — Progress Notes (Addendum)
Speech Language Pathology Daily Session Note  Patient Details  Name: [name] MRN: [id number] Date of Birth: 1940-12-25  Today's Date: 08/28/2017 SLP Individual Time: 1130-1200 SLP Individual Time Calculation (min): 30 min  Short Term Goals: Week 2: SLP Short Term Goal 1 (Week 2): Pt will donn and doff PMSV with Max A cues.  SLP Short Term Goal 2 (Week 2): Pt will utilize external memory aid to recall basic information with Max A cues (such as swallowing/diet recommendation information).  SLP Short Term Goal 3 (Week 2): Pt will demonstrate basic problem solving with familiar tasks and Min A cues.  SLP Short Term Goal 4 (Week 2): Pt will demonstrate selective attention for ~ 30 minutes with supervision cues.  SLP Short Term Goal 5 (Week 2): Pt will consume regular diet with thin liquids without overt s/s of aspiration with supervision cues for use of compensatory swallow strategies.   Skilled Therapeutic Interventions: Skilled treatment session focused on cognition goals. SLP received pt in bed shivering/tremorous. Pt states that she has been "doing this all day and can't stop." Pt doesn't appear anxious, states that she doesn't feel well, appears pale. SLP offered pt blanket and covered pt. SLP made pt's nurse aware and nursing contributed this shivering to pt's anxious response to capping trials on previous day. However pt had been shivering all day with no acknowledgement of fear etc. Pt currently with PMSV in place. SLP made PA aware of pt's changes with PA to assess. This Probation officer also discussed pt's failed capping trials with PA. SLP continues to hear audible exhalation with PMSV donned that is not present when trach is open. Although pt's O2 remains within functional limits during capping trials, this writer continues to question patency of upper airway given audible exhalations with PMSV in place. PA will contact Nelda Marseille and discuss with pt's rehab MD Naaman Plummer). SLP went back to pt's room and  she was peacefully sleeping after having additional blankets offered.   Request ENT consult to assess for possible vocal cord dysfunction or any tracheal stinosis that might be preventing successful capping trials. I realize that pt's O2 sats remain stable when capped and she is able to exchange air however pt continues to experience discomfort which leads to panic attack. Given audible exhalations with PMSV in place and given that pt's vocal quality remains significantly impaired from baseline, request ENT evaluation of upper air way.      Function:   Cognition Comprehension Comprehension assist level: Follows basic conversation/direction with no assist  Expression Expression assistive device: Talk trach valve Expression assist level: Expresses basic needs/ideas: With no assist  Social Interaction Social Interaction assist level: Interacts appropriately 90% of the time - Needs monitoring or encouragement for participation or interaction.  Problem Solving Problem solving assist level: Solves basic problems with no assist  Memory Memory assist level: Recognizes or recalls 90% of the time/requires cueing < 10% of the time    Pain Pain Assessment Pain Assessment: No/denies pain Pain Score: 0-No pain  Therapy/Group: Individual Therapy  Ramin Zoll 08/28/2017, 2:08 PM

## 2017-08-28 NOTE — Progress Notes (Signed)
Occupational Therapy Session Note  Patient Details  Name: [name] MRN: [id number] Date of Birth: [birth year]  Today's Date: 08/28/2017 OT Individual Time: 0737-1062 OT Individual Time Calculation (min): 78 min    Short Term Goals: Week 2:  OT Short Term Goal 1 (Week 2): Pt will recall and utilize sternal pre-cautions during functional tasks with mod A OT Short Term Goal 2 (Week 2): Pt will perform toileting tasks with steady A OT Short Term Goal 3 (Week 2): Pt will perform toilet transfers with steady A OT Short Term Goal 4 (Week 2): Pt will complete LB dressing tasks with min A  Skilled Therapeutic Interventions/Progress Updates:    Pt greeted supine in bed, no c/o pain and agreeable to participate in  bathing/dressing tasks. PMSV already donned. Tx focus on activity tolerance, precaution adherence, standing endurance, balance, and UE coordination. Pt transitioned to EOB with cues for sternal precaution adherence. Pt completing bathing at sit<stand level with Mod A for lifting assist. She completed pericare herself with extra time and cues for thoroughness. After donning shirt with Min A, pt reported feeling fatigued and needing to lie down to complete LB dressing. She was able to achieve figure 4 with LEs to thread pants, and cued her to roll Rt>Lt vs bridging to adhere to precautions while elevating LB garments over hips. Pt also donning gripper socks in supine. Once she had a rest break, pt completed grooming tasks EOB with supervision, then completed stand pivot transfer to w/c without AD and Mod A. Pt assisting therapist with doffing/donning pillowcases to make up bed with clean linen. She required extra time, rest breaks, and encouragement to complete due to tremulous UE activity/endurance deficits. Pt then transferred back to bed, sidestepping towards [facility] with RW. Pt returned to supine with min guard, repositioned herself with cues. Pt was left with all needs within reach and bed alarm  set.    Therapy Documentation Precautions:  Precautions Precautions: Fall, Sternal Precaution Comments: Reinforced sternal precautions Restrictions Weight Bearing Restrictions: No Other Position/Activity Restrictions: sternal precautions Vital Signs: Therapy Vitals Pulse Rate: 96 Resp: 18 Patient Position (if appropriate): Lying Oxygen Therapy SpO2: 98 % O2 Device: Room Air Pain: No c/o pain during session    ADL:     See Function Navigator for Current Functional Status.   Therapy/Group: Individual Therapy  [name] A Christeen Lai 08/28/2017, 12:11 PM

## 2017-08-28 NOTE — Progress Notes (Signed)
[place] PHYSICAL MEDICINE & REHABILITATION     PROGRESS NOTE    Subjective/Complaints: Per RN tolerated trach capping for ~2hours yesterday, reviewed RRT note, had some secretions as well, discussed with RRT and RN  ROS: +DOE. Denies nausea, vomiting, diarrhea, shortness of breath or chest pain   Objective: Vital Signs:  Blood pressure (!) 93/47, pulse 89, temperature 97.8 F (36.6 C), temperature source Oral, resp. rate 17, height 5\' 5"  (1.651 m), weight 70.1 kg (154 lb 8.7 oz), SpO2 94 %. Vas [place] Lower Extremity Venous (dvt)  Result Date: 08/15/2017  Lower Venous Study Indication: Edema. Risk Factors: Surgery aortic aneurysm repair. Examination Guidelines: A complete evaluation includes B-mode imaging, spectral doppler, color doppler, and power doppler as needed of all accessible portions of each vessel. Bilateral testing is considered an integral part of a complete examination. Limited examinations for reoccurring indications may be performed as noted. The reflux portion of the exam is performed with the patient in reverse Trendelenburg.  Right Venous Findings: +---------+---------------+---------+-----------+----------+-----------------+          CompressibilityPhasicitySpontaneityPropertiesSummary           +---------+---------------+---------+-----------+----------+-----------------+ CFV      Full           Yes      Yes                                    +---------+---------------+---------+-----------+----------+-----------------+ FV Prox  Full           Yes      Yes                                    +---------+---------------+---------+-----------+----------+-----------------+ FV Mid   Full           Yes      Yes                                    +---------+---------------+---------+-----------+----------+-----------------+ FV DistalFull           Yes      Yes                                     +---------+---------------+---------+-----------+----------+-----------------+ PFV      Full           Yes      Yes                                    +---------+---------------+---------+-----------+----------+-----------------+ POP      Full           Yes      Yes                                    +---------+---------------+---------+-----------+----------+-----------------+ PTV      Full                                                           +---------+---------------+---------+-----------+----------+-----------------+  PERO     Full                                                           +---------+---------------+---------+-----------+----------+-----------------+ GSV      Partial                                      Age Indeterminate +---------+---------------+---------+-----------+----------+-----------------+  Left Venous Findings: +---------+---------------+---------+-----------+----------+-------+          CompressibilityPhasicitySpontaneityPropertiesSummary +---------+---------------+---------+-----------+----------+-------+ CFV      Full           Yes      Yes                          +---------+---------------+---------+-----------+----------+-------+ FV Prox  Full           Yes      Yes                          +---------+---------------+---------+-----------+----------+-------+ FV Mid   Full           Yes      Yes                          +---------+---------------+---------+-----------+----------+-------+ FV DistalFull           Yes      Yes                          +---------+---------------+---------+-----------+----------+-------+ PFV      Full           Yes      Yes                          +---------+---------------+---------+-----------+----------+-------+ POP      Full           Yes      Yes                          +---------+---------------+---------+-----------+----------+-------+ PTV      Full                                                  +---------+---------------+---------+-----------+----------+-------+ PERO     Full                                                 +---------+---------------+---------+-----------+----------+-------+ GSV      Full                                                 +---------+---------------+---------+-----------+----------+-------+    Final Interpretation: Right: There is no evidence of deep vein thrombosis  in the lower extremity. Positive for an age interminate superficial thrombosis of the greater saphenous vein just distal to the confluence with the saphenofemoral juction. There is no evidence of a Baker's cyst. Left: There is no evidence of deep vein thrombosis in the lower extremity.There is no evidence of superficial venous thrombosis. There is no evidence of a Baker's cyst.  *See table(s) above for measurements and observations. Electronically signed by Deitra Mayo on 08/15/2017 at 4:50:03 PM.    No results for input(s): WBC, HGB, HCT, PLT in the last 72 hours. No results for input(s): NA, K, CL, GLUCOSE, BUN, CREATININE, CALCIUM in the last 72 hours.  Invalid input(s): CO CBG (last 3)  No results for input(s): GLUCAP in the last 72 hours.  Wt Readings from Last 3 Encounters:  08/28/17 70.1 kg (154 lb 8.7 oz)  08/12/17 73.8 kg (162 lb 11.2 oz)  07/06/17 76.5 kg (168 lb 11.2 oz)    Physical Exam:  HENT: Normocephalic. Atraumatic.  Eyes: EOMI. No dischage.  Neck: Trach tube in place with PMV--tolerating well Cardiac:RRR without murmur. No JVD   Respiratory: voice strong. Lungs fairly clear this morning.  GI: Bowel sounds are normal. She exhibits no distension.  Musculoskeletal: She exhibits no tenderness or deformity.  Neurological: Alert Makes good eye contact with examiner.  Followed full commands.  Motor: B/l UE 4-/5 prox to distal.  B/l LE:   3-/5 HF, 3/5 KE and 3+/5 ADF/PF.  LUE tremor. Psychiatric: pleasant, a  little nervous, left foot shaking  Assessment/Plan: 1.  Functional deficits secondary to debility which require 3+ hours per day of interdisciplinary therapy in a comprehensive inpatient rehab setting. Physiatrist is providing close team supervision and 24 hour management of active medical problems listed below. Physiatrist and rehab team continue to assess barriers to discharge/monitor patient progress toward functional and medical goals.  Function:  Bathing Bathing position Bathing activity did not occur: (per nursing only peri care done, patient refuses bathing ) Position: Wheelchair/chair at sink  Bathing parts Body parts bathed by patient: Right arm, Left arm, Abdomen, Front perineal area, Right upper leg, Left upper leg Body parts bathed by helper: Buttocks, Right lower leg, Left lower leg, Back  Bathing assist Assist Level: Touching or steadying assistance(Pt > 75%)      Upper Body Dressing/Undressing Upper body dressing   What is the patient wearing?: Pull over shirt/dress     Pull over shirt/dress - Perfomed by patient: Thread/unthread right sleeve, Thread/unthread left sleeve, Put head through opening Pull over shirt/dress - Perfomed by helper: Pull shirt over trunk Button up shirt - Perfomed by patient: Thread/unthread right sleeve, Thread/unthread left sleeve Button up shirt - Perfomed by helper: Thread/unthread right sleeve, Thread/unthread left sleeve, Pull shirt around back    Upper body assist Assist Level: Supervision or verbal cues      Lower Body Dressing/Undressing Lower body dressing   What is the patient wearing?: Underwear, Pants, [employer], Shoes Underwear - Performed by patient: Pull underwear up/down Underwear - Performed by helper: Thread/unthread right underwear leg, Thread/unthread left underwear leg Pants- Performed by patient: Pull pants up/down Pants- Performed by helper: Thread/unthread right pants leg, Thread/unthread left pants leg Non-skid  slipper socks- Performed by patient: Don/doff left sock Non-skid slipper socks- Performed by helper: Don/doff right sock, Don/doff left sock   Socks - Performed by helper: Don/doff right sock, Don/doff left sock   Shoes - Performed by helper: Don/doff right shoe, Don/doff left shoe, Fasten right, Fasten left  TED Hose - Performed by helper: Don/doff right TED hose, Don/doff left TED hose  Lower body assist Assist for lower body dressing: Touching or steadying assistance (Pt > 75%)      Toileting Toileting Toileting activity did not occur: No continent bowel/bladder event Toileting steps completed by patient: Adjust clothing prior to toileting, Performs perineal hygiene, Adjust clothing after toileting Toileting steps completed by helper: Adjust clothing prior to toileting, Performs perineal hygiene, Adjust clothing after toileting Toileting Assistive Devices: Grab bar or rail  Toileting assist Assist level: Two helpers   Transfers Chair/bed transfer   Chair/bed transfer [place]: Stand pivot Chair/bed transfer assist level: Touching or steadying assistance (Pt > 75%) Chair/bed transfer assistive device: Medical sales representative     Max distance: 62 ft Assist level: Touching or steadying assistance (Pt > 75%)   Wheelchair Wheelchair activity did not occur: Safety/medical concerns Type: Manual   Assist Level: Dependent (Pt equals 0%)  Cognition Comprehension Comprehension assist level: Follows basic conversation/direction with no assist  Expression Expression assist level: Expresses basic needs/ideas: With no assist  Social Interaction Social Interaction assist level: Interacts appropriately 90% of the time - Needs monitoring or encouragement for participation or interaction.  Problem Solving Problem solving assist level: Solves basic problems with no assist  Memory Memory assist level: Recognizes or recalls 90% of the time/requires cueing < 10% of the time   Medical Problem List and Plan:  1. Debility secondary to debility related to abdominal aneurysm/AS with AVR 07/10/2017 status post repair and subsequent cardiogenic shock    Cont CIR PT OT speech   2. DVT Prophylaxis/Anticoagulation: Subcutaneous Lovenox. Dopplers negative 3. Pain Management: Tylenol as needed  4. Mood:  , Seroquel 75 mg bedtime, Effexor 37.5 mg twice a day  BuSpar during the day, will increase dose to 10mg    -Klonopin prn changed to xanax over weekend---observe 5. Neuropsych: This patient is capable of making decisions on her own behalf.  6. Skin/Wound Care: Routine skin checks  7. Fluids/Electrolytes/Nutrition:       on D3 and thin.    -hypokalemia due to Lasix: 3.2 on 2/26,3.4 on 3/1. kdur increased labs pending   -intake inconsistent, weight trending down gradually   -cont megace  8. Tracheostomy 07/25/2017. Downsized to #4 cuffless 08/08/2017. No current plan to decannulate due to failure with capping. Follow-up per Dr. Nelda Marseille.    -Continue nebulizers, trach care and suction   -Guaifenesin to help with secretions   -continue with PMV    -CONTINUE CAPPING TRIALS  But will wait until outpt to decannulate---anxiety has been a big problem     9. Atrial fibrillation. Amiodarone 200 mg daily, ASA 81mg . Cardiac rate controlled  10. Dysphagia. Advanced to D3/thins   CXR reviewed, stable 11. Acute diastolic congestive heart failure. Lasix 40 mg daily, Aldactone 25 mg daily. Monitor for any signs of fluid overload    -continue current diuretics.  BUN stable --labs pending Filed Weights   08/26/17 0410 08/27/17 0549 08/28/17 0500  Weight: 71.5 kg (157 lb 10.1 oz) 71.4 kg (157 lb 6.5 oz) 70.1 kg (154 lb 8.7 oz)   12. Hypothyroidism. Synthroid   LOS (Days) 16 A FACE TO FACE EVALUATION WAS PERFORMED  Meredith Staggers, MD 08/28/2017 9:11 AM

## 2017-08-29 ENCOUNTER — Inpatient Hospital Stay (HOSPITAL_COMMUNITY): Payer: Medicare Other

## 2017-08-29 ENCOUNTER — Inpatient Hospital Stay (HOSPITAL_COMMUNITY): Payer: Medicare Other | Admitting: Physical Therapy

## 2017-08-29 DIAGNOSIS — R1312 Dysphagia, oropharyngeal phase: Secondary | ICD-10-CM

## 2017-08-29 MED ORDER — CLONAZEPAM 0.25 MG PO TBDP
0.2500 mg | ORAL_TABLET | Freq: Two times a day (BID) | ORAL | Status: DC
  Administered 2017-08-29 – 2017-09-02 (×9): 0.25 mg via ORAL
  Filled 2017-08-29 (×9): qty 1

## 2017-08-29 MED ORDER — CLONAZEPAM 0.5 MG PO TABS: 0.2500 mg | ORAL_TABLET | Freq: Two times a day (BID) | ORAL | Status: DC

## 2017-08-29 NOTE — Evaluation (Signed)
Recreational Therapy Assessment and Plan  Patient Details  Name: [name] MRN: [id number] Date of Birth: 05-Sep-1940 Today's Date: 08/29/2017  Rehab Potential: Good ELOS: discharge 3/9   Assessment Problem List:      Patient Active Problem List   Diagnosis Date Noted  . Debility 08/12/2017  . PAF (paroxysmal atrial fibrillation) ([place])   . Pressure injury of skin 08/04/2017  . Tracheostomy in place [facility])   . Acute encephalopathy   . HCAP (healthcare-associated pneumonia)   . Acute respiratory failure ([place])   . RVF (right ventricular failure) ([place])   . RVAD (right ventricular assist device) present ([place])   . Cardiogenic shock ([place])   . S/P AVR 07/10/2017  . Nephrolithiasis 04/07/2017  . Vitamin B 12 deficiency 03/01/2016  . Ascending aortic aneurysm ([place]) 08/20/2015  . Thyroid cancer ([place]) 07/30/2015  . Left thyroid nodule 07/27/2015  . Osteoporosis with fracture 04/16/2014  . At high risk for falls 04/16/2014  . Proximal humerus fracture 05/30/2011  . CHEST PAIN, PRECORDIAL 02/26/2010  . Hyperlipidemia 02/23/2010  . Depression 02/23/2010  . GLAUCOMA 02/23/2010  . CATARACTS 02/23/2010  . RHEUMATIC FEVER 02/23/2010  . Essential hypertension 02/23/2010  . MITRAL VALVE PROLAPSE 02/23/2010  . GERD 02/23/2010  . Osteoarthritis 02/23/2010  . Primary fibromyalgia syndrome 02/23/2010    Past Medical History:      Past Medical History:  Diagnosis Date  . ACL tear    right  Dr. Gladstone Pih   . Adenomatous polyp   . Anxiety   . Aorta aneurysm ([place])   . Arthritis   . Ascending aortic aneurysm ([place])    note per chart per Dr Lucianne Lei Tright 4.7 cm 04/15/2015   . B12 deficiency   . Cancer ([place])   . Cataracts, both eyes 10/2006  . Chronic bronchitis ([place])   . Constipation   . Depression   . DUB (dysfunctional uterine bleeding) 10/96  . ETD (eustachian tube dysfunction)   . Fall   . Fibromyalgia   . GERD (gastroesophageal reflux disease)   .  Glaucoma    ([place]) Dr. Arnoldo Morale   . Glaucoma    bilaterally  . Helicobacter pylori (H. pylori)   . Hiatal hernia   . History of bronchitis   . History of frequent urinary tract infections   . History of kidney stones   . History of left shoulder fracture    pt states fell off bed and broke ball in shoulder had rod placed   . Hyperlipidemia   . Hypertension   . Hypothyroidism   . Insomnia   . Leg wound, left    healed   . MVP (mitral valve prolapse)   . Occasional tremors    left arm   . Osteoarthritis   . Osteopenia   . Osteoporosis   . Other and unspecified hyperlipidemia   . Post-menopausal   . Thyroid cancer ([place])   . Thyroid nodule   . Tremor    Past Surgical History:       Past Surgical History:  Procedure Laterality Date  . AORTIC VALVE REPLACEMENT N/A 07/10/2017   Procedure: AORTIC VALVE REPLACEMENT (AVR);  Surgeon: [name], Collier Salina, MD;  Location: [place];  Service: Open Heart Surgery;  Laterality: N/A;  . APPENDECTOMY    . BUNIONECTOMY  08/1999   right - Dr. Irving Shows   . CHOLECYSTECTOMY  1979  . DILATION AND CURETTAGE OF UTERUS  03/31/95   Dr. Ovid Curd   . EXPLORATION POST OPERATIVE OPEN HEART  N/A 07/11/2017   Procedure: EXPLORATION POST OPERATIVE OPEN HEART;  Surgeon: Ivin Poot, MD;  Location: [place];  Service: Open Heart Surgery;  Laterality: N/A;  . EYE SURGERY     also had left cataract removed   . FRACTURE SURGERY    . HEMATOMA EVACUATION N/A 07/11/2017   Procedure: EVACUATION HEMATOMA;  Surgeon: Ivin Poot, MD;  Location: [place];  Service: Open Heart Surgery;  Laterality: N/A;  . HERNIA REPAIR    . NEPHROLITHOTOMY Left 04/07/2017   Procedure: NEPHROLITHOTOMY PERCUTANEOUS WITH SURGEON ACCESS;  Surgeon: Ardis Hughs, MD;  [facility];  Service: Urology;  Laterality: Left;  . ORIF HUMERUS FRACTURE  05/30/2011   Procedure: OPEN REDUCTION INTERNAL FIXATION (ORIF) PROXIMAL HUMERUS FRACTURE;  Surgeon:  Augustin Schooling;  Location: [place];  Service: Orthopedics;  Laterality: Left;  open reduction internal fixation of proximal humerus fracture  . PLACEMENT OF CENTRIMAG VENTRICULAR ASSIST DEVICE Right 07/11/2017   Procedure: PLACEMENT OF CENTRIMAG VENTRICULAR ASSIST DEVICE;  Surgeon: Ivin Poot, MD;  Location: [place];  Service: Open Heart Surgery;  Laterality: Right;  . REMOVAL OF CENTRIMAG VENTRICULAR ASSIST DEVICE N/A 07/17/2017   Procedure: REMOVAL OF RVAD WITH PUMP STANDBY;  Surgeon: Ivin Poot, MD;  Location: [place];  Service: Open Heart Surgery;  Laterality: N/A;  . REPLACEMENT ASCENDING AORTA N/A 07/10/2017   Procedure: REPLACEMENT ASCENDING AORTA;  Surgeon: Ivin Poot, MD;  Location: [place];  Service: Open Heart Surgery;  Laterality: N/A;  . right knee replacement  3/11   Dr. Gladstone Pih  . RIGHT/LEFT HEART CATH AND CORONARY ANGIOGRAPHY N/A 06/13/2017   Procedure: RIGHT/LEFT HEART CATH AND CORONARY ANGIOGRAPHY;  Surgeon: Jolaine Artist, MD;  Location: [place] [facility];  Service: Cardiovascular;  Laterality: N/A;  . rt. eye cataract  05/28/07  . TEE WITHOUT CARDIOVERSION N/A 07/10/2017   Procedure: TRANSESOPHAGEAL ECHOCARDIOGRAM (TEE);  Surgeon: [name], Collier Salina, MD;  Location: [place];  Service: Open Heart Surgery;  Laterality: N/A;  . TEE WITHOUT CARDIOVERSION  07/11/2017   Procedure: TRANSESOPHAGEAL ECHOCARDIOGRAM (TEE);  Surgeon: [name], Collier Salina, MD;  Location: [place];  Service: Open Heart Surgery;;  . TEE WITHOUT CARDIOVERSION N/A 07/17/2017   Procedure: TRANSESOPHAGEAL ECHOCARDIOGRAM (TEE);  Surgeon: [name], Collier Salina, MD;  Location: [place];  Service: Open Heart Surgery;  Laterality: N/A;  . THYROID LOBECTOMY Left 07/27/2015   Procedure: LEFT THYROID LOBECTOMY;  Surgeon: Fanny Skates, MD;  [facility];  Service: General;  Laterality: Left;  . THYROIDECTOMY N/A 08/06/2015   Procedure: RIGHT THYROID LOBECTOMY, REIMPLANTATION PARATHYROID;  Surgeon: Fanny Skates,  MD;  [facility];  Service: General;  Laterality: N/A;  . VENTRAL HERNIA REPAIR  2/99    Assessment & Plan Clinical Impression: [name] is a 77 y.o. right handed female who is been followed by cardiothoracic surgery for a fusiform ascending aneurysm which has increased in size over the past 1-2 years with associated moderate aortic insufficiency. Per chart review patient lives with spouse. Independent with assistive device using a cane prior to admission. One level home 4 steps to entry. Spouse can assist as needed. Presented 07/09/2017 due to increasing size of a ascending aneurysm and underwent repair of ascending aneurysm as well as aortic valve replacement 07/10/2017 per Dr. Nils Pyle. Postoperative ventilatory support. Findings a small amount of anterior mediastinal hematoma as well as orthostasis requiring pressor support. Her urine output decreased concern for tamponade and return back to the operating  room for reexploration to remove any blood/hematoma 07/11/2017 as well as findings RV function significantly decreased from earlier postoperative status and underwent implantation of percutaneous right ventricular assist device. Hospital course ventilatory support underwent tracheostomy 07/25/2017 per Dr. Nelda Marseille and has since been down sized to a #4 cuff less 08/08/2017 and tolerating PMV. No current plan to decannulate as did not tolerate capping trial. Hospital course acute blood loss anemia. Bouts of atrial fibrillation followed by cardiology services and currently maintained on amiodarone. Currently maintained on subcutaneous Lovenox for DVT prophylaxis. Bouts of hypokalemia supplement added. Dysphagia #1 pudding liquids and nasogastric tube feeds for nutritional support. Physical as well as occupational therapy evaluations completed 07/19/2017. Request made for physical medicine rehabilitation consult. Patient was admitted for a comprehensive rehabilitation program and Patient  transferred to CIR on 08/12/2017 .    Pt presents with decreased activity tolerance, decreased functional mobility, decreased balance, decreased awareness, decreased problem solving, decreased safety awareness, decreased memory, delayed processing and difficulty maintaining precautions Limiting pt's independence with leisure/community pursuits.  Plan Rec Therapy Plan Is patient appropriate for Therapeutic Recreation?: Yes Rehab Potential: Good Treatment times per week: Min 1 TR session during LOS >20 minutes Estimated Length of Stay: discharge 3/9 TR Treatment/Interventions: Adaptive equipment instruction;Cognitive remediation/compensation;Group participation (Comment);Therapeutic exercise;Wheelchair propulsion/positioning;1:1 session;Community reintegration;Leisure education;Recreation/leisure participation;UE/LE Chartered certified accountant training;Functional mobility training;Patient/family education;Therapeutic activities  Recommendations for other services: None   Discharge Criteria: Patient will be discharged from TR if patient refuses treatment 3 consecutive times without medical reason.  If treatment goals not met, if there is a change in medical status, if patient makes no progress towards goals or if patient is discharged from hospital.  The above assessment, treatment plan, treatment alternatives and goals were discussed and mutually agreed upon: by patient  [place] 08/29/2017, 1:15 PM

## 2017-08-29 NOTE — Progress Notes (Signed)
Physical Therapy Weekly Progress Note  Patient Details  Name: [name] MRN: [id number] Date of Birth: 03-Nov-1940  Beginning of progress report period: August 22, 2017 End of progress report period: August 29, 2017  Today's Date: 08/29/2017 PT Individual Time: 0800-0915 PT Individual Time Calculation (min): 75 min   Patient has met 2 of 3 short term goals.  Pt is making steady progress towards LTGs. Pt demonstrates improving ability to perform sit<>stand from low surfaces, requires min/modA to boost, decreasing need for vcing to maintain sternal precautions. Continues to be limited by endurance, becomes anxious with exertion, however reports some improvement with addition of anxiety medication. Pt ambulates up to 145f with minA for balance using RW.  Patient continues to demonstrate the following deficits muscle weakness, decreased cardiorespiratoy endurance and decreased standing balance, decreased postural control, decreased balance strategies and difficulty maintaining precautions and therefore will continue to benefit from skilled PT intervention to increase functional independence with mobility.  Patient progressing toward long term goals.  Continue plan of care.  PT Short Term Goals Week 2:  PT Short Term Goal 1 (Week 2): Pt will ambulate x 572fusing RW with mod assist PT Short Term Goal 1 - Progress (Week 2): Met PT Short Term Goal 2 (Week 2): Pt will perform sit<>stand with consistent minA while maintaining sternal precuations PT Short Term Goal 2 - Progress (Week 2): Progressing toward goal PT Short Term Goal 3 (Week 2): Pt will perform stand pivot transfer with min assist and use of RW PT Short Term Goal 3 - Progress (Week 2): Met Week 3:  PT Short Term Goal 1 (Week 3): =LTG d/t ELOS  Skilled Therapeutic Interventions/Progress Updates:    Pt greeted supine in bed, handed off from nursing, eating breakfast. MD enters session to perform assessment, suggests use of weighted  utensils while self-feeding, d/t tremors. Pt requests to perform bird bath in bed, performs tasks with set-upA of retrieving washcloths, minA to roll to L for therapist assisting with peri care. Pt dons underwear, pants, and socks in supine, requires A to thread LLE through underwear and pants, therapist dons shoes to maintain sternal precautions. Pt transitions supine to sit with minA for trunk without use of UE, pt dons shirt with set-upA. Performs stand pivot transfer bed>W/C using RW with minA for boosting. Pt sits in manual W/C at sink while brushing teeth/ hair with A to brush back of head d/t precautions. Pt dependently transported to therapy gym d/t precautions, performs 5 x sit<>stand emphasizing speed and endurance with improved ability to perform consecutively. Pt performs 2 x 30sec standing with toes elevated on wedge for B gastroc stretch while threading beads on string. Performs 2 x 12 step taps on 6in step using BUE support of RW with steady A/minA for balance. Pt ambulates approximately 45 ft using RW with steadyA, transported back to room. At end of session, pt left sitting upright in manual W/C, handed off to RN, denies any needs at this time.  Therapy Documentation Precautions:  Precautions Precautions: Fall, Sternal Precaution Comments: Reinforced sternal precautions Restrictions Weight Bearing Restrictions: No Other Position/Activity Restrictions: sternal precautions Vital Signs: Oxygen Therapy SpO2: 97 % O2 Device: Room Air FiO2 (%): 21 % Pain: Pain Assessment Pain Assessment: 0-10 Pain Score: 0-No pain  See Function Navigator for Current Functional Status.  Therapy/Group: Individual Therapy  MoSalvatore Decent/10/2017, 11:45 AM

## 2017-08-29 NOTE — Progress Notes (Signed)
Nutrition Follow-up  DOCUMENTATION CODES:   Not applicable  INTERVENTION:  Continue 30 ml Prostat po BID, each supplement provides 100 kcal and 15 grams of protein.   Continue Premier Protein po BID, each supplement provides 160 kcal and 30 grams of protein.   Encourage adequate PO intake.   NUTRITION DIAGNOSIS:   Inadequate oral intake related to other (see comment), poor appetite(pt dislike of dysphagia diet) as evidenced by per patient/family report; ongoing  GOAL:   Patient will meet greater than or equal to 90% of their needs; met  MONITOR:   PO intake, I & O's, Weight trends, Diet advancement, Skin, Supplement acceptance  REASON FOR ASSESSMENT:   Consult Enteral/tube feeding initiation and management  ASSESSMENT:   [name] with PMH of HTN, HLD, and B12 deficiency. S/p planned repair of ascending aortic aneurysm and aortic valve replacement on 07/10/2017. S/p right ventricular assist device and hematoma exploration/removal on 07/11/2017. RVAD removed and sternum closed on 07/17/2017. Pt required vent support and subsequent tracheostomy on 07/25/2017. Tube feeds D/C 08/14/2017; pt on Dysphagia I, pudding thick liquids 08/12/2017.  Meal completion has been 50-100% with 100% at lunch today. Pt reports she has recently started to eat better. Pt currently has premier protein and prostat ordered and has been consuming them. RD to continue with current orders. Pt encouraged to eat her food at meals. Labs and medications reviewed. Pt continues on megace.   Diet Order:  DIET DYS 3 Room service appropriate? Yes; Fluid consistency: Thin  EDUCATION NEEDS:   Education needs have been addressed  Skin:  Skin Assessment: Skin Integrity Issues: Skin Integrity Issues:: Stage II Stage II: neck Incisions: abdomen, groin, chest x2 Other: Laceration/skin tears lower legs  Last BM:  3/3  Height:   Ht Readings from Last 1 Encounters:  08/12/17 5' 5" (1.651 m)    Weight:   Wt  Readings from Last 1 Encounters:  08/29/17 158 lb 15.2 oz (72.1 kg)    Ideal Body Weight:  56.8 kg  BMI:  Body mass index is 26.45 kg/m.  Estimated Nutritional Needs:   Kcal:  1600-1800  Protein:  85-95g  Fluid:  1.6-1.8L    Corrin Parker, MS, RD, LDN Pager # [phone] After hours/ weekend pager # [phone]

## 2017-08-29 NOTE — Patient Care Conference (Signed)
Inpatient RehabilitationTeam Conference and Plan of Care Update Date: 08/30/2017   Time: 2:05 PM    Patient Name: [name]      Medical Record Number: [id number]  Date of Birth: [birth year] Sex: Female         Room/Bed: 4W18C/4W18C-01 Payor Info: Payor: [place] / Plan: BCBS MEDICARE / Product Type: *No Product type* /    Admitting Diagnosis: debility  Admit Date/Time:  08/12/2017  4:11 PM Admission Comments: No comment available   Primary Diagnosis:  <principal problem not specified> Principal Problem: <principal problem not specified>  Patient Active Problem List   Diagnosis Date Noted  . Acute diastolic congestive heart failure ([place])   . Dysphagia   . Anxiety state   . Debility 08/12/2017  . PAF (paroxysmal atrial fibrillation) ([place])   . Pressure injury of skin 08/04/2017  . Tracheostomy in place [facility])   . Acute encephalopathy   . HCAP (healthcare-associated pneumonia)   . Acute respiratory failure ([place])   . RVF (right ventricular failure) ([place])   . RVAD (right ventricular assist device) present ([place])   . Cardiogenic shock ([place])   . S/P AVR 07/10/2017  . Nephrolithiasis 04/07/2017  . Vitamin B 12 deficiency 03/01/2016  . Ascending aortic aneurysm ([place]) 08/20/2015  . Thyroid cancer ([place]) 07/30/2015  . Left thyroid nodule 07/27/2015  . Osteoporosis with fracture 04/16/2014  . At high risk for falls 04/16/2014  . Proximal humerus fracture 05/30/2011  . CHEST PAIN, PRECORDIAL 02/26/2010  . Hyperlipidemia 02/23/2010  . Depression 02/23/2010  . GLAUCOMA 02/23/2010  . CATARACTS 02/23/2010  . RHEUMATIC FEVER 02/23/2010  . Essential hypertension 02/23/2010  . MITRAL VALVE PROLAPSE 02/23/2010  . GERD 02/23/2010  . Osteoarthritis 02/23/2010  . Primary fibromyalgia syndrome 02/23/2010    Expected Discharge Date: Expected Discharge Date: 09/02/17  Team Members Present: Physician leading conference: Dr. Alger Simons Social Worker Present: Alfonse Alpers, [name] Nurse Present: Rayetta Pigg, RN PT Present: Kem Parkinson, PT OT Present: Roanna Epley, [employer], OT SLP Present: Charolett Bumpers, SLP PPS Coordinator present : Ileana Ladd, PT     Current Status/Progress Goal Weekly Team Focus  Medical   Patient failed trach capping trials largely due to anxiety.  Plan now will be to continue with Passy-Muir valve upon discharge.  Anxiety remains an ongoing issue otherwise as well  Stabilized from a anxiety and pulmonary standpoint for discharge  Anxiety control, pulmonary management   Bowel/Bladder   Continent of bowel/bladder but can be incontinent of bladder at times. LBM 08/27/17  To be continent of bowe/bladder with mod assist  Assess bowel/bladder function q shift and as needed   Swallow/Nutrition/ Hydration   dys 3 and thin   Supervision A      ADL's   Min A bathing EOB, Supervision UB dressing, Min A LB dressing, Min-Mod A stand pivot bathroom transfers + supervision toileting tasks  supervision overall  ADL retraining, functional transfers, standing balance, cognitive remediation, pt/family education    Mobility   minA bed mobility, min/mod sit<>stand, minA gait up to 152f   S overall, minA bed mobility and stairs  endurance, BLE flexibility and strength, trasnfer/gait training, initiate stair training   Communication             Safety/Cognition/ Behavioral Observations  Mod A use of VA for recall, Mod A on/off PMSV, Supervision A selective attention  MIn A, Supervision A  PMSV education, use of notebook for recall, problem solving basic  Pain   Denies pain  <2  Assess and treat pain q shift and as needed   Skin   surgical site to chest OTA, multiple skin tears to BLE with thin film dsg, MASD to the butt, butt cream  Skin to be free of breakdown/infection with min assist  Assess skin q shift and as needed    Rehab Goals Patient on target to meet rehab goals: Yes Rehab Goals Revised: OT may downgrade  some goals, as husband was helping with these things PTA. *See Care Plan and progress notes for long and short-term goals.     Barriers to Discharge  Current Status/Progress Possible Resolutions Date Resolved   Physician    Medical stability        Ongoing anxiety management.  Klonopin low dose along with BuSpar      Nursing                  PT                    OT                  SLP                SW                Discharge Planning/Teaching Needs:  Pt plans to return to her home where her husband and other family members will provide supervision/min A as needed.    Husband can come for family education closer to d/c.   Team Discussion:  Pt's voice is getting stronger.  Pt's trach will not be capped or decannulated here and pt will f/u with pulmonary as an outpt.  Pt's anxiety is impacting this process.  Pt is not getting enough nutritionally.  Pt can sit to stand with min A and met min A ambulation goals.  Pt observes sternal precautions better when getting out of bed, but still has poor endurance at times.  Pt has made some progress with ADLs, but husband assisted with some ADLs PTA, so OT will downgrade some goals.  ST started a memory book with pt, but it is not working for her.  ST continues to work on taking PMV off and putting it back on, but anxiety still impacts this.  ST also trying finger occlusion to prepare for capping.  Revisions to Treatment Plan:  none    Continued Need for Acute Rehabilitation Level of Care: The patient requires daily medical management by a physician with specialized training in physical medicine and rehabilitation for the following conditions: Daily direction of a multidisciplinary physical rehabilitation program to ensure safe treatment while eliciting the highest outcome that is of practical value to the patient.: Yes Daily medical management of patient stability for increased activity during participation in an intensive rehabilitation  regime.: Yes Daily analysis of laboratory values and/or radiology reports with any subsequent need for medication adjustment of medical intervention for : Neurological problems;Mood/behavior problems  Osaze Hubbert, Silvestre Mesi 08/30/2017, 9:22 AM

## 2017-08-29 NOTE — Progress Notes (Signed)
Discussed attempting trach capping at this time.  Pt states that she is tired from therapies, will attempt when back in bed.  Encouraged pt to have trach capped as much as possible to get used to the feeling.  Scant secretions cleaned off of IC, encouraged humidity to trach HS.

## 2017-08-29 NOTE — Progress Notes (Signed)
Speech Language Pathology Daily Session Note  Patient Details  Name: [name] MRN: [id number] Date of Birth: July 10, 1940  Today's Date: 08/29/2017 SLP Individual Time: 1300-1400 SLP Individual Time Calculation (min): 60 min  Short Term Goals: Week 2: SLP Short Term Goal 1 (Week 2): Pt will donn and doff PMSV with Max A cues.  SLP Short Term Goal 2 (Week 2): Pt will utilize external memory aid to recall basic information with Max A cues (such as swallowing/diet recommendation information).  SLP Short Term Goal 3 (Week 2): Pt will demonstrate basic problem solving with familiar tasks and Min A cues.  SLP Short Term Goal 4 (Week 2): Pt will demonstrate selective attention for ~ 30 minutes with supervision cues.  SLP Short Term Goal 5 (Week 2): Pt will consume regular diet with thin liquids without overt s/s of aspiration with supervision cues for use of compensatory swallow strategies.   Skilled Therapeutic Interventions:Skilled ST services focused on cognitive and PMSV education skills. SLP facilitated education donning on/off PMSV utilizing mirror requiring Mod A verbal cues impacted by anxiety. SLP instructed pt in diaphragmic breathing exercises to reduce anxiety during finger occlusion trials to simulate capping trials, pt tolerated in 1 minute intervals with stable O2 (96) and heat rate (95). SLP facilitated recall, use of memory notebook to recall today's events, specific recall of PMSV verse capping and cleaning protocol for PMSV, pt required Min A verbal cues to utilize [employer], however Mod A verbal cues to recall why notebook was needed. Pt demonstrated selective attention in mildly distracting environment with supervision A question cues. Pt was left in room with call bell within reach. Reccomend to continue skilled ST services.      Function:  Eating Eating   Modified Consistency Diet: Yes Eating Assist Level: Set up assist for;Supervision or verbal cues   Eating Set Up  Assist For: Opening containers       Cognition Comprehension Comprehension assist level: Follows basic conversation/direction with no assist  Expression Expression assistive device: Talk trach valve Expression assist level: Expresses basic 90% of the time/requires cueing < 10% of the time.  Social Interaction Social Interaction assist level: Interacts appropriately 90% of the time - Needs monitoring or encouragement for participation or interaction.  Problem Solving Problem solving assist level: Solves basic 75 - 89% of the time/requires cueing 10 - 24% of the time  Memory Memory assist level: Recognizes or recalls 50 - 74% of the time/requires cueing 25 - 49% of the time;Recognizes or recalls 75 - 89% of the time/requires cueing 10 - 24% of the time    Pain Pain Assessment Pain Assessment: No/denies pain  Therapy/Group: Individual Therapy  [name]  [facility] 08/29/2017, 3:53 PM

## 2017-08-29 NOTE — Progress Notes (Signed)
Occupational Therapy Session Note  Patient Details  Name: [name] MRN: [id number] Date of Birth: 16-Feb-1941  Today's Date: 08/29/2017 OT Individual Time: 1030-1130 OT Individual Time Calculation (min): 60 min    Short Term Goals: Week 2:  OT Short Term Goal 1 (Week 2): Pt will recall and utilize sternal pre-cautions during functional tasks with mod A OT Short Term Goal 2 (Week 2): Pt will perform toileting tasks with steady A OT Short Term Goal 3 (Week 2): Pt will perform toilet transfers with steady A OT Short Term Goal 4 (Week 2): Pt will complete LB dressing tasks with min A  Skilled Therapeutic Interventions/Progress Updates:    Pt resting in w/c upon arrival with husband present.  Per pt's report bathing/dressing completed with PT earlier in the morning.  OT intervention with focus on functional amb with RW, ongoing discharge planning, toileting tasks, safety awareness, and standing balance.  Pt continues to require min A for sit<>stand with mod verbal cues to follow sternal precautions.  Pt completed toileting tasks at supervision level.  Pt and husband state that they arranging for assistance/supervision when husband not at home.  Pt remained in w/c with all needs within reach and husband present.   Therapy Documentation Precautions:  Precautions Precautions: Fall, Sternal Precaution Comments: Reinforced sternal precautions Restrictions Weight Bearing Restrictions: No Other Position/Activity Restrictions: sternal precautions Pain: Pain Assessment Pain Assessment: 0-10 Pain Score: 0-No pain  See Function Navigator for Current Functional Status.   Therapy/Group: Individual Therapy  Leroy Libman 08/29/2017, 12:08 PM

## 2017-08-29 NOTE — Progress Notes (Addendum)
[facility]     PROGRESS NOTE    Subjective/Complaints: Patient had a better night.  Still anxiety noted.  Doing well with speaking valve in place  ROS: pt denies nausea, vomiting, diarrhea, cough, shortness of breath or chest pain   Objective: Vital Signs:  Blood pressure 125/70, pulse 93, temperature 98.1 F (36.7 C), temperature source Oral, resp. rate 18, height 5\' 5"  (1.651 m), weight 72.1 kg (158 lb 15.2 oz), SpO2 98 %. Vas [place] Lower Extremity Venous (dvt)  Result Date: 08/15/2017  Lower Venous Study Indication: Edema. Risk Factors: Surgery aortic aneurysm repair. Examination Guidelines: A complete evaluation includes B-mode imaging, spectral doppler, color doppler, and power doppler as needed of all accessible portions of each vessel. Bilateral testing is considered an integral part of a complete examination. Limited examinations for reoccurring indications may be performed as noted. The reflux portion of the exam is performed with the patient in reverse Trendelenburg.  Right Venous Findings: +---------+---------------+---------+-----------+----------+-----------------+          CompressibilityPhasicitySpontaneityPropertiesSummary           +---------+---------------+---------+-----------+----------+-----------------+ CFV      Full           Yes      Yes                                    +---------+---------------+---------+-----------+----------+-----------------+ FV Prox  Full           Yes      Yes                                    +---------+---------------+---------+-----------+----------+-----------------+ FV Mid   Full           Yes      Yes                                    +---------+---------------+---------+-----------+----------+-----------------+ FV DistalFull           Yes      Yes                                    +---------+---------------+---------+-----------+----------+-----------------+ PFV       Full           Yes      Yes                                    +---------+---------------+---------+-----------+----------+-----------------+ POP      Full           Yes      Yes                                    +---------+---------------+---------+-----------+----------+-----------------+ PTV      Full                                                           +---------+---------------+---------+-----------+----------+-----------------+  PERO     Full                                                           +---------+---------------+---------+-----------+----------+-----------------+ GSV      Partial                                      Age Indeterminate +---------+---------------+---------+-----------+----------+-----------------+  Left Venous Findings: +---------+---------------+---------+-----------+----------+-------+          CompressibilityPhasicitySpontaneityPropertiesSummary +---------+---------------+---------+-----------+----------+-------+ CFV      Full           Yes      Yes                          +---------+---------------+---------+-----------+----------+-------+ FV Prox  Full           Yes      Yes                          +---------+---------------+---------+-----------+----------+-------+ FV Mid   Full           Yes      Yes                          +---------+---------------+---------+-----------+----------+-------+ FV DistalFull           Yes      Yes                          +---------+---------------+---------+-----------+----------+-------+ PFV      Full           Yes      Yes                          +---------+---------------+---------+-----------+----------+-------+ POP      Full           Yes      Yes                          +---------+---------------+---------+-----------+----------+-------+ PTV      Full                                                  +---------+---------------+---------+-----------+----------+-------+ PERO     Full                                                 +---------+---------------+---------+-----------+----------+-------+ GSV      Full                                                 +---------+---------------+---------+-----------+----------+-------+    Final Interpretation: Right: There is no evidence of deep vein thrombosis  in the lower extremity. Positive for an age interminate superficial thrombosis of the greater saphenous vein just distal to the confluence with the saphenofemoral juction. There is no evidence of a Baker's cyst. Left: There is no evidence of deep vein thrombosis in the lower extremity.There is no evidence of superficial venous thrombosis. There is no evidence of a Baker's cyst.  *See table(s) above for measurements and observations. Electronically signed by Deitra Mayo on 08/15/2017 at 4:50:03 PM.    No results for input(s): WBC, HGB, HCT, PLT in the last 72 hours. Recent Labs    08/28/17 0749  NA 139  K 4.4  CL 99*  GLUCOSE 106*  BUN 16  CREATININE 0.69  CALCIUM 8.7*   CBG (last 3)  No results for input(s): GLUCAP in the last 72 hours.  Wt Readings from Last 3 Encounters:  08/29/17 72.1 kg (158 lb 15.2 oz)  08/12/17 73.8 kg (162 lb 11.2 oz)  07/06/17 76.5 kg (168 lb 11.2 oz)    Physical Exam:  HENT: Normocephalic. Atraumatic.  Eyes: EOMI. No dischage.  Neck: Trach tube in place with PMV--good phonation today Cardiac: RRR without murmur. No JVD    Respiratory: CTA Bilaterally without wheezes or rales. Normal effort   GI: Bowel sounds are normal. She exhibits no distension.  Musculoskeletal: No tenderness with range of motion Neurological: Alert Makes good eye contact with examiner.  Followed full commands.  Motor: B/l UE 4-/5 prox to distal.  B/l LE: 3- out of 5 to 3 out of 5 proximal distal LUE greater than right upper extremity tremor.  Worse with  intentional movements but also notable at rest. Psychiatric: Pleasant but remains anxious per baseline  Assessment/Plan: 1.  Functional deficits secondary to debility which require 3+ hours per day of interdisciplinary therapy in a comprehensive inpatient rehab setting. Physiatrist is providing close team supervision and 24 hour management of active medical problems listed below. Physiatrist and rehab team continue to assess barriers to discharge/monitor patient progress toward functional and medical goals.  Function:  Bathing Bathing position Bathing activity did not occur: (per nursing only peri care done, patient refuses bathing ) Position: Sitting EOB  Bathing parts Body parts bathed by patient: Right arm, Left arm, Abdomen, Front perineal area, Right upper leg, Left upper leg, Right lower leg, Left lower leg, Chest, Buttocks Body parts bathed by helper: Back  Bathing assist Assist Level: Touching or steadying assistance(Pt > 75%)      Upper Body Dressing/Undressing Upper body dressing   What is the patient wearing?: Pull over shirt/dress     Pull over shirt/dress - Perfomed by patient: Thread/unthread right sleeve, Thread/unthread left sleeve, Put head through opening Pull over shirt/dress - Perfomed by helper: Pull shirt over trunk Button up shirt - Perfomed by patient: Thread/unthread right sleeve, Thread/unthread left sleeve Button up shirt - Perfomed by helper: Thread/unthread right sleeve, Thread/unthread left sleeve, Pull shirt around back    Upper body assist Assist Level: Supervision or verbal cues      Lower Body Dressing/Undressing Lower body dressing   What is the patient wearing?: Underwear, Pants, Non-skid slipper socks Underwear - Performed by patient: Thread/unthread right underwear leg, Thread/unthread left underwear leg, Pull underwear up/down Underwear - Performed by helper: Thread/unthread right underwear leg, Thread/unthread left underwear leg Pants-  Performed by patient: Thread/unthread right pants leg, Thread/unthread left pants leg, Pull pants up/down Pants- Performed by helper: Thread/unthread right pants leg, Thread/unthread left pants leg Non-skid slipper socks- Performed by patient: Don/doff  left sock, Don/doff right sock Non-skid slipper socks- Performed by helper: Don/doff right sock, Don/doff left sock   Socks - Performed by helper: Don/doff right sock, Don/doff left sock   Shoes - Performed by helper: Don/doff right shoe, Don/doff left shoe, Fasten right, Fasten left       TED Hose - Performed by helper: Don/doff right TED hose, Don/doff left TED hose  Lower body assist Assist for lower body dressing: Touching or steadying assistance (Pt > 75%)      Toileting Toileting Toileting activity did not occur: No continent bowel/bladder event Toileting steps completed by patient: Adjust clothing prior to toileting, Performs perineal hygiene, Adjust clothing after toileting Toileting steps completed by helper: Adjust clothing prior to toileting, Performs perineal hygiene, Adjust clothing after toileting Toileting Assistive Devices: Grab bar or rail  Toileting assist Assist level: Touching or steadying assistance (Pt.75%)   Transfers Chair/bed transfer   Chair/bed transfer method: Stand pivot Chair/bed transfer assist level: Touching or steadying assistance (Pt > 75%) Chair/bed transfer assistive device: Medical sales representative     Max distance: 85 ft Assist level: Touching or steadying assistance (Pt > 75%)   Wheelchair Wheelchair activity did not occur: Safety/medical concerns Type: Manual   Assist Level: Dependent (Pt equals 0%)  Cognition Comprehension Comprehension assist level: Follows basic conversation/direction with no assist  Expression Expression assist level: Expresses basic 90% of the time/requires cueing < 10% of the time.  Social Interaction Social Interaction assist level: Interacts  appropriately 90% of the time - Needs monitoring or encouragement for participation or interaction.  Problem Solving Problem solving assist level: Solves basic 90% of the time/requires cueing < 10% of the time  Memory Memory assist level: Recognizes or recalls 90% of the time/requires cueing < 10% of the time  Medical Problem List and Plan:  1. Debility secondary to debility related to abdominal aneurysm/AS with AVR 07/10/2017 status post repair and subsequent cardiogenic shock    Cont CIR PT OT speech   -Team conference today 2. DVT Prophylaxis/Anticoagulation: Subcutaneous Lovenox. Dopplers negative 3. Pain Management: Tylenol as needed  4. Mood:  , Seroquel 75 mg bedtime, Effexor 37.5 mg twice a day  BuSpar  10mg  bid   -Klonopin prn changed to xanax over weekend---observe--do not see a big change    -We will go back to Klonopin but schedule at low dose twice daily 5. Neuropsych: This patient is capable of making decisions on her own behalf.  6. Skin/Wound Care: Routine skin checks  7. Fluids/Electrolytes/Nutrition:       on D3 and thin.    -hypokalemia due to Lasix: 3.2 on 2/26,3.4 on 3/1. kdur increased in potassium up to 4.4 on labs from 08/28/2017   -Weight a little bit better yesterday but intake inconsistent.  Reviewed the importance of her nutrition as it pertains to her recovery.   -cont megace  8. Tracheostomy 07/25/2017. Downsized to #4 cuffless 08/08/2017. No current plan to decannulate due to failure with capping. Follow-up per Dr. Nelda Marseille.    -Continue nebulizers, trach care and suction if needed   -Guaifenesin to help with secretions   -continue with PMV    - wait until outpt to decannulate---anxiety has been a big problem     9. Atrial fibrillation. Amiodarone 200 mg daily, ASA 81mg . Cardiac rate controlled  10. Dysphagia. Advanced to D3/thins   CXR reviewed, stable 11. Acute diastolic congestive heart failure. Lasix 40 mg daily, Aldactone 25 mg daily. Monitor for any  signs  of fluid overload    -continue current diuretics.  BUN stable --upon labs from 08/28/2017 [facility] Weights   08/27/17 0549 08/28/17 0500 08/29/17 0330  Weight: 71.4 kg (157 lb 6.5 oz) 70.1 kg (154 lb 8.7 oz) 72.1 kg (158 lb 15.2 oz)   12. Hypothyroidism. Synthroid   LOS (Days) 17 A FACE TO FACE EVALUATION WAS PERFORMED  Meredith Staggers, MD 08/29/2017 9:20 AM

## 2017-08-29 NOTE — Progress Notes (Signed)
PMV removed and placed on 21% ATC for humidity HS

## 2017-08-30 ENCOUNTER — Inpatient Hospital Stay (HOSPITAL_COMMUNITY): Payer: Medicare Other | Admitting: Physical Therapy

## 2017-08-30 ENCOUNTER — Inpatient Hospital Stay (HOSPITAL_COMMUNITY): Payer: Medicare Other

## 2017-08-30 ENCOUNTER — Inpatient Hospital Stay (HOSPITAL_COMMUNITY): Payer: Medicare Other | Admitting: *Deleted

## 2017-08-30 ENCOUNTER — Inpatient Hospital Stay (HOSPITAL_COMMUNITY): Payer: Medicare Other | Admitting: Speech Pathology

## 2017-08-30 ENCOUNTER — Encounter (HOSPITAL_COMMUNITY): Payer: Medicare Other | Admitting: Psychology

## 2017-08-30 MED ORDER — BUSPIRONE HCL 5 MG PO TABS
5.0000 mg | ORAL_TABLET | Freq: Every day | ORAL | Status: DC
  Administered 2017-08-31 – 2017-09-02 (×3): 5 mg via ORAL
  Filled 2017-08-30 (×3): qty 1

## 2017-08-30 MED ORDER — BISACODYL 5 MG PO TBEC
10.0000 mg | DELAYED_RELEASE_TABLET | Freq: Every day | ORAL | Status: DC | PRN
  Administered 2017-08-30 – 2017-08-31 (×2): 10 mg via ORAL
  Filled 2017-08-30 (×2): qty 2

## 2017-08-30 NOTE — Progress Notes (Signed)
Physical Therapy Session Note  Patient Details  Name: [name] MRN: [id number] Date of Birth: 11/23/1940  Today's Date: 08/30/2017 PT Individual Time: 1302-1400 PT Individual Time Calculation (min): 58 min   Short Term Goals: Week 3:  PT Short Term Goal 1 (Week 3): =LTG d/t ELOS  Skilled Therapeutic Interventions/Progress Updates:    Pt using the bathroom upon PT arrival, agreeable to therapy tx and denies pain. Pt performed sit<>stand from toilet with min assist using RW, performed clothing management with supervision. Pt ambulated with RW and supervision to sink in order to wash hands and then to w/c. Pt transported to the gym. Pt ambulated x 80 ft with RW and min assist, increased work of breathing, SpO2 100%. Pt worked on standing balance to perform toe taps on 4 inch step, x 1 trial with RW and x 1 trial without UE support. Pt transferred to the mat and to supine with supervision. In supine pt performed LE strengthening exercises, 2 x 10 each: heel slides, SLRs, bridges, hip abduction in hooklying with theraband. Pt transferred to sitting with min assist. Pt transferred from mat>w/c via ambulation with RW and min assist. Pt ascended/descended 4 steps with min assist, verbal cues for step to pattern and techniques. Pt transported back to room and left seated with needs in reach.   Therapy Documentation Precautions:  Precautions Precautions: Fall, Sternal Precaution Comments: Reinforced sternal precautions Restrictions Weight Bearing Restrictions: No Other Position/Activity Restrictions: sternal precautions   See Function Navigator for Current Functional Status.   Therapy/Group: Individual Therapy  Netta Corrigan, PT, DPT 08/30/2017, 7:52 AM

## 2017-08-30 NOTE — Progress Notes (Signed)
PHYSICAL MEDICINE & REHABILITATION     PROGRESS NOTE    Subjective/Complaints: Feels that she's getting stronger. Has questions about trach  ROS: pt denies nausea, vomiting, diarrhea, cough, shortness of breath or chest pain    Objective: Vital Signs:  Blood pressure 114/63, pulse 92, temperature (!) 97.3 F (36.3 C), temperature source Oral, resp. rate (!) 24, height 5\' 5"  (1.651 m), weight 72.3 kg (159 lb 4.5 oz), SpO2 94 %. Vas [place] Lower Extremity Venous (dvt)  Result Date: 08/15/2017  Lower Venous Study Indication: Edema. Risk Factors: Surgery aortic aneurysm repair. Examination Guidelines: A complete evaluation includes B-mode imaging, spectral doppler, color doppler, and power doppler as needed of all accessible portions of each vessel. Bilateral testing is considered an integral part of a complete examination. Limited examinations for reoccurring indications may be performed as noted. The reflux portion of the exam is performed with the patient in reverse Trendelenburg.  Right Venous Findings: +---------+---------------+---------+-----------+----------+-----------------+          CompressibilityPhasicitySpontaneityPropertiesSummary           +---------+---------------+---------+-----------+----------+-----------------+ CFV      Full           Yes      Yes                                    +---------+---------------+---------+-----------+----------+-----------------+ FV Prox  Full           Yes      Yes                                    +---------+---------------+---------+-----------+----------+-----------------+ FV Mid   Full           Yes      Yes                                    +---------+---------------+---------+-----------+----------+-----------------+ FV DistalFull           Yes      Yes                                    +---------+---------------+---------+-----------+----------+-----------------+ PFV      Full           Yes       Yes                                    +---------+---------------+---------+-----------+----------+-----------------+ POP      Full           Yes      Yes                                    +---------+---------------+---------+-----------+----------+-----------------+ PTV      Full                                                           +---------+---------------+---------+-----------+----------+-----------------+  PERO     Full                                                           +---------+---------------+---------+-----------+----------+-----------------+ GSV      Partial                                      Age Indeterminate +---------+---------------+---------+-----------+----------+-----------------+  Left Venous Findings: +---------+---------------+---------+-----------+----------+-------+          CompressibilityPhasicitySpontaneityPropertiesSummary +---------+---------------+---------+-----------+----------+-------+ CFV      Full           Yes      Yes                          +---------+---------------+---------+-----------+----------+-------+ FV Prox  Full           Yes      Yes                          +---------+---------------+---------+-----------+----------+-------+ FV Mid   Full           Yes      Yes                          +---------+---------------+---------+-----------+----------+-------+ FV DistalFull           Yes      Yes                          +---------+---------------+---------+-----------+----------+-------+ PFV      Full           Yes      Yes                          +---------+---------------+---------+-----------+----------+-------+ POP      Full           Yes      Yes                          +---------+---------------+---------+-----------+----------+-------+ PTV      Full                                                 +---------+---------------+---------+-----------+----------+-------+  PERO     Full                                                 +---------+---------------+---------+-----------+----------+-------+ GSV      Full                                                 +---------+---------------+---------+-----------+----------+-------+    Final Interpretation: Right: There is no evidence of deep vein thrombosis  in the lower extremity. Positive for an age interminate superficial thrombosis of the greater saphenous vein just distal to the confluence with the saphenofemoral juction. There is no evidence of a Baker's cyst. Left: There is no evidence of deep vein thrombosis in the lower extremity.There is no evidence of superficial venous thrombosis. There is no evidence of a Baker's cyst.  *See table(s) above for measurements and observations. Electronically signed by Deitra Mayo on 08/15/2017 at 4:50:03 PM.    No results for input(s): WBC, HGB, HCT, PLT in the last 72 hours. Recent Labs    08/28/17 0749  NA 139  K 4.4  CL 99*  GLUCOSE 106*  BUN 16  CREATININE 0.69  CALCIUM 8.7*   CBG (last 3)  No results for input(s): GLUCAP in the last 72 hours.  Wt Readings from Last 3 Encounters:  08/30/17 72.3 kg (159 lb 4.5 oz)  08/12/17 73.8 kg (162 lb 11.2 oz)  07/06/17 76.5 kg (168 lb 11.2 oz)    Physical Exam:  HENT: Normocephalic. Atraumatic.  Eyes: EOMI. No dischage.  Neck: Trach tube in place with PMV--improving phonation Cardiac: RRR without murmur. No JVD     Respiratory: fair air movement, normal efffort GI: Bowel sounds are normal. She exhibits no distension.  Musculoskeletal: No tenderness with range of motion Neurological: Alert Makes good eye contact with examiner.  Followed full commands.  Motor: B/l UE 4-/5 prox to distal.  B/l LE: 3- out of 5 to 3 out of 5 proximal distal---stable motor exam LUE greater than right upper extremity tremor==perhaps sl decr today Psychiatric: Pleasant sl anxious  Assessment/Plan: 1.  Functional  deficits secondary to debility which require 3+ hours per day of interdisciplinary therapy in a comprehensive inpatient rehab setting. Physiatrist is providing close team supervision and 24 hour management of active medical problems listed below. Physiatrist and rehab team continue to assess barriers to discharge/monitor patient progress toward functional and medical goals.  Function:  Bathing Bathing position Bathing activity did not occur: (per nursing only peri care done, patient refuses bathing ) Position: Sitting EOB  Bathing parts Body parts bathed by patient: Right arm, Left arm, Abdomen, Front perineal area, Right upper leg, Left upper leg, Right lower leg, Left lower leg, Chest, Buttocks Body parts bathed by helper: Back  Bathing assist Assist Level: Touching or steadying assistance(Pt > 75%)      Upper Body Dressing/Undressing Upper body dressing   What is the patient wearing?: Pull over shirt/dress     Pull over shirt/dress - Perfomed by patient: Thread/unthread right sleeve, Thread/unthread left sleeve, Put head through opening Pull over shirt/dress - Perfomed by helper: Pull shirt over trunk Button up shirt - Perfomed by patient: Thread/unthread right sleeve, Thread/unthread left sleeve Button up shirt - Perfomed by helper: Thread/unthread right sleeve, Thread/unthread left sleeve, Pull shirt around back    Upper body assist Assist Level: Supervision or verbal cues      Lower Body Dressing/Undressing Lower body dressing   What is the patient wearing?: Underwear, Pants, Non-skid slipper socks Underwear - Performed by patient: Thread/unthread right underwear leg, Thread/unthread left underwear leg, Pull underwear up/down Underwear - Performed by helper: Thread/unthread right underwear leg, Thread/unthread left underwear leg Pants- Performed by patient: Thread/unthread right pants leg, Thread/unthread left pants leg, Pull pants up/down Pants- Performed by helper:  Thread/unthread right pants leg, Thread/unthread left pants leg Non-skid slipper socks- Performed by patient: Don/doff left sock, Don/doff right sock Non-skid slipper socks- Performed by helper: Don/doff right  sock, Don/doff left sock   Socks - Performed by helper: Don/doff right sock, Don/doff left sock   Shoes - Performed by helper: Don/doff right shoe, Don/doff left shoe, Fasten right, Fasten left       TED Hose - Performed by helper: Don/doff right TED hose, Don/doff left TED hose  Lower body assist Assist for lower body dressing: Touching or steadying assistance (Pt > 75%)      Toileting Toileting Toileting activity did not occur: No continent bowel/bladder event Toileting steps completed by patient: Adjust clothing prior to toileting, Performs perineal hygiene, Adjust clothing after toileting Toileting steps completed by helper: Adjust clothing prior to toileting, Performs perineal hygiene, Adjust clothing after toileting Toileting Assistive Devices: Grab bar or rail  Toileting assist Assist level: Touching or steadying assistance (Pt.75%)   Transfers Chair/bed transfer   Chair/bed transfer method: Stand pivot Chair/bed transfer assist level: Touching or steadying assistance (Pt > 75%) Chair/bed transfer assistive device: Medical sales representative     Max distance: 68ft Assist level: Touching or steadying assistance (Pt > 75%)   Wheelchair Wheelchair activity did not occur: Safety/medical concerns Type: Manual   Assist Level: Dependent (Pt equals 0%)  Cognition Comprehension Comprehension assist level: Follows basic conversation/direction with no assist  Expression Expression assist level: Expresses basic 90% of the time/requires cueing < 10% of the time.  Social Interaction Social Interaction assist level: Interacts appropriately 90% of the time - Needs monitoring or encouragement for participation or interaction.  Problem Solving Problem solving assist level:  Solves basic 75 - 89% of the time/requires cueing 10 - 24% of the time  Memory Memory assist level: Recognizes or recalls 50 - 74% of the time/requires cueing 25 - 49% of the time, Recognizes or recalls 75 - 89% of the time/requires cueing 10 - 24% of the time  Medical Problem List and Plan:  1. Debility secondary to debility related to abdominal aneurysm/AS with AVR 07/10/2017 status post repair and subsequent cardiogenic shock    Cont CIR PT OT speech   -reviewed dc plan with patient 2. DVT Prophylaxis/Anticoagulation: Subcutaneous Lovenox. Dopplers negative 3. Pain Management: Tylenol as needed  4. Mood:  , Seroquel 75 mg bedtime, Effexor 37.5 mg twice a day  BuSpar  10mg  bid---not sure this is having a big effect----decrease to 5mg  and likely stop prior to discharge.    --continue klonopin 0.25mg  bid for now---consider titration prio to dc 5. Neuropsych: This patient is capable of making decisions on her own behalf.  6. Skin/Wound Care: Routine skin checks  7. Fluids/Electrolytes/Nutrition:       on D3 and thin.    -hypokalemia due to Lasix:  potassium up to 4.4 on labs from 08/28/2017   -weight stable, ate a bit more yesterday   -cont megace  8. Tracheostomy 07/25/2017. Downsized to #4 cuffless 08/08/2017. No current plan to decannulate due to failure with capping. Follow-up per Dr. Nelda Marseille.    -Continue nebulizers, trach care and suction if needed   -Guaifenesin to help with secretions   -continue with PMV    - wait until outpt to decannulate---anxiety a problem   -needs outpt pulmonary follow up in trach clinic   -can do some "finger plugging" to simulate capping on her own--discussed with her today  9. Atrial fibrillation. Amiodarone 200 mg daily, ASA 81mg . Cardiac rate controlled  10. Dysphagia. Advanced to D3/thins   CXR reviewed, stable 11. Acute diastolic congestive heart failure. Lasix 40 mg daily, Aldactone  25 mg daily. Monitor for any signs of fluid overload    -continue  current diuretics.  BUN stable --upon labs from 08/28/2017 [facility] Weights   08/28/17 0500 08/29/17 0330 08/30/17 0327  Weight: 70.1 kg (154 lb 8.7 oz) 72.1 kg (158 lb 15.2 oz) 72.3 kg (159 lb 4.5 oz)   12. Hypothyroidism. Synthroid   LOS (Days) 18 A FACE TO FACE EVALUATION WAS PERFORMED  Alger Simons T, MD 08/30/2017 8:30 AM

## 2017-08-30 NOTE — Progress Notes (Signed)
Pulmonary at bedside with husband and pt for trach teaching.

## 2017-08-30 NOTE — Progress Notes (Signed)
Recreational Therapy Session Note  Patient Details  Name: [name] MRN: [id number] Date of Birth: [birth year] Today's Date: 08/30/2017  Pain: no c/o  Skilled Therapeutic Interventions/Progress Updates:  Discussed community pursuits and pts anxiety around community events and people.  Discussed plan to simulate community tasks tomorrow during co-treat with OT.  Pt stated understanding and is agreeable. Lesage 08/30/2017, 3:31 PM

## 2017-08-30 NOTE — Progress Notes (Signed)
Speech Language Pathology Daily Session Note  Patient Details  Name: [name] MRN: [id number] Date of Birth: Mar 07, 1941  Today's Date: 08/30/2017 SLP Individual Time: 1000-1030 SLP Individual Time Calculation (min): 30 min  Short Term Goals: Week 2: SLP Short Term Goal 1 (Week 2): Pt will donn and doff PMSV with Max A cues.  SLP Short Term Goal 2 (Week 2): Pt will utilize external memory aid to recall basic information with Max A cues (such as swallowing/diet recommendation information).  SLP Short Term Goal 3 (Week 2): Pt will demonstrate basic problem solving with familiar tasks and Min A cues.  SLP Short Term Goal 4 (Week 2): Pt will demonstrate selective attention for ~ 30 minutes with supervision cues.  SLP Short Term Goal 5 (Week 2): Pt will consume regular diet with thin liquids without overt s/s of aspiration with supervision cues for use of compensatory swallow strategies.   Skilled Therapeutic Interventions: Skilled treatment session focused on cognition goals related to trach, PMSV and toleration of capping trials. Pt requires Max A to Mod A cues to physically donn and doff PMSV, husband present and didn't want to attempt donning and doffing. He states pt is able to do it and wear it - "unless she coughs it off, then I will just stick it back on there." Also spoke with RT about coordinating trials of capping with ST sessions. Will attempt to coordinate before pt discharges. Pt left upright in wheelchair with husband present.      Function:  Eating Eating   Modified Consistency Diet: Yes Eating Assist Level: Set up assist for;Supervision or verbal cues   Eating Set Up Assist For: Opening containers       Cognition Comprehension Comprehension assist level: Follows basic conversation/direction with no assist  Expression Expression assistive device: Talk trach valve Expression assist level: Expresses basic 90% of the time/requires cueing < 10% of the time.;Expresses basic  needs/ideas: With extra time/assistive device  Social Interaction Social Interaction assist level: Interacts appropriately 90% of the time - Needs monitoring or encouragement for participation or interaction.  Problem Solving Problem solving assist level: Solves basic 75 - 89% of the time/requires cueing 10 - 24% of the time  Memory Memory assist level: Recognizes or recalls 50 - 74% of the time/requires cueing 25 - 49% of the time;Recognizes or recalls 75 - 89% of the time/requires cueing 10 - 24% of the time    Pain    Therapy/Group: Individual Therapy  Emillia Weatherly 08/30/2017, 12:25 PM

## 2017-08-30 NOTE — Progress Notes (Signed)
Physical Therapy Session Note  Patient Details  Name: [name] MRN: [id number] Date of Birth: 19-Jun-1941  Today's Date: 08/30/2017 PT Individual Time: 7062-3762 PT Individual Time Calculation (min): 33 min   Short Term Goals: Week 3:  PT Short Term Goal 1 (Week 3): =LTG d/t ELOS  Skilled Therapeutic Interventions/Progress Updates: Pt presented in w/c agreeable to therapy. Session focused on activity tolerance and transfers. Pt transferred to rehab gym for energy conservation. Pt ambulated 28f with RW min guard, SPO2 checked 85%, resolved to 93% within 15sec. Pt participated in sit to/from stand from elevated mat x 6 with cues for increasing anterior wt shift and with pt placing hands on knees. Pt required rest break after 3 repittitions. Pt performed ambulatory transfer to w/c. Pt returned back to room and transferred back to bed. Performed sit to supine with min guard and increased time and pt left in bed with bed alarm on, needs met, and husband present.      Therapy Documentation Precautions:  Precautions Precautions: Fall, Sternal Precaution Comments: Reinforced sternal precautions Restrictions Weight Bearing Restrictions: No Other Position/Activity Restrictions: sternal precautions General:   Vital Signs: Therapy Vitals Temp: 97.6 F (36.4 C) Temp Source: Oral Pulse Rate: (!) 106 Resp: 19 BP: (!) 119/59 Patient Position (if appropriate): Sitting Oxygen Therapy SpO2: 97 % O2 Device: Room Air   See Function Navigator for Current Functional Status.   Therapy/Group: Individual Therapy  Lyden Redner  Lajoya Dombek, PTA  08/30/2017, 3:46 PM

## 2017-08-30 NOTE — Progress Notes (Signed)
Pt c/o incomplete bowel movement and requested bisacodyl tabs (2). This is dose she uses at home. Called PA, new order added.

## 2017-08-30 NOTE — Progress Notes (Signed)
Occupational Therapy Session Note  Patient Details  Name: [name] MRN: [id number] Date of Birth: 05-13-1941  Today's Date: 08/30/2017 OT Individual Time: 1155-2080 OT Individual Time Calculation (min): 87 min    Short Term Goals: Week 3:  OT Short Term Goal 1 (Week 3): STG=LTG secondary to ELOS  Skilled Therapeutic Interventions/Progress Updates:    OT intervention with focus on bed mobility, functional amb with RW, toileting tasks, bathing at shower level, and dressing with sit<>stand from chair.  Pt required assistance with bathing buttocks but was able to bathe B feet. Pt required assistance with donning shoes.  Pt becomes anxious easily and requires extended rest breaks to relax and continue with tasks.  Discussed relaxation strategies and engergy conservation strategies.  Pt O2 sats >93% during session.  Therapy Documentation Precautions:  Precautions Precautions: Fall, Sternal Precaution Comments: Reinforced sternal precautions Restrictions Weight Bearing Restrictions: No Other Position/Activity Restrictions: sternal precautions  Pain:    See Function Navigator for Current Functional Status.   Therapy/Group: Individual Therapy  Leroy Libman 08/30/2017, 10:03 AM

## 2017-08-30 NOTE — Progress Notes (Signed)
Occupational Therapy Weekly Progress Note  Patient Details  Name: [name] MRN: [id number] Date of Birth: 1940/08/21  Beginning of progress report period: August 22, 2017 End of progress report period: August 30, 2017  Patient has met 4 of 4 short term goals. Pt made steady progress with BADLs during the past week.  Pt has demonstrated improvement with overall activity tolerance, functional transfers, and toileting tasks.  Pt continues to require min A for LB bathing/dressing tasks and LTG have been downgraded to min A.  Pt requires mod A for following sternal precautions.  Pt fatigues easily and demonstrates increased anxiety with fatigue, requiring multiple rest breaks during functional tasks.  Pt's husband has been present for therapy and has verbalized understanding of recommendation for 24 hour assistance with occasional min A with LB bathing/dressing tasks.   Patient continues to demonstrate the following deficits: muscle weakness, decreased cardiorespiratoy endurance, impaired timing and sequencing, decreased coordination and decreased motor planning, decreased awareness, decreased problem solving, decreased safety awareness, decreased memory and delayed processing and decreased sitting balance, decreased standing balance, decreased balance strategies and difficulty maintaining precautions and therefore will continue to benefit from skilled OT intervention to enhance overall performance with BADL and Reduce care partner burden.  Patient progressing toward long term goals..  Plan of care revisions: Some goals downgraded to better reflect level of A will need. .  OT Short Term Goals Week 2:  OT Short Term Goal 1 (Week 2): Pt will recall and utilize sternal pre-cautions during functional tasks with mod A OT Short Term Goal 1 - Progress (Week 2): Met OT Short Term Goal 2 (Week 2): Pt will perform toileting tasks with steady A OT Short Term Goal 2 - Progress (Week 2): Met OT Short Term Goal  3 (Week 2): Pt will perform toilet transfers with steady A OT Short Term Goal 3 - Progress (Week 2): Met OT Short Term Goal 4 (Week 2): Pt will complete LB dressing tasks with min A OT Short Term Goal 4 - Progress (Week 2): Met Week 3:  OT Short Term Goal 1 (Week 3): STG=LTG secondary to ELOS     Therapy Documentation Precautions:  Precautions Precautions: Fall, Sternal Precaution Comments: Reinforced sternal precautions Restrictions Weight Bearing Restrictions: No Other Position/Activity Restrictions: sternal precautions  See Function Navigator for Current Functional Status.     Leotis Shames [facility] 08/30/2017, 6:44 AM

## 2017-08-30 NOTE — Progress Notes (Signed)
Saw patient for trach education.  Patient and husband demonstrated a good understanding of routine trach care.  Patient and husband demonstrated a clear understanding of what to do in an emergency situation.  Stressed the importance of continuing to wear the cap to increase tolerance.  Husband demonstrated the ability to use BVM if needed.  All other questions answered.  Encouraged both to perform routine care with the assistance of staff while still in the hospital to develop a routine.  Both in agreement to do so.  Will continue to follow.

## 2017-08-30 NOTE — Progress Notes (Signed)
Called respiratory [phone] talked to Randall Hiss, pulmonary to do teaching with pt and husband today at noon.  He will f/u to verify teaching.  Husband is here but in therapy at this time.

## 2017-08-31 ENCOUNTER — Inpatient Hospital Stay (HOSPITAL_COMMUNITY): Payer: Medicare Other | Admitting: *Deleted

## 2017-08-31 ENCOUNTER — Inpatient Hospital Stay (HOSPITAL_COMMUNITY): Payer: Medicare Other | Admitting: Physical Therapy

## 2017-08-31 ENCOUNTER — Inpatient Hospital Stay (HOSPITAL_COMMUNITY): Payer: Medicare Other | Admitting: Speech Pathology

## 2017-08-31 ENCOUNTER — Inpatient Hospital Stay (HOSPITAL_COMMUNITY): Payer: Medicare Other | Admitting: Occupational Therapy

## 2017-08-31 NOTE — Progress Notes (Signed)
[facility] PHYSICAL MEDICINE & REHABILITATION     PROGRESS NOTE    Subjective/Complaints: Had another reasonable night.  Tolerating her Passy-Muir valve well once again.  Anxious to be going home.  ROS: pt denies nausea, vomiting, diarrhea, cough, shortness of breath or chest pain   Objective: Vital Signs:  Blood pressure (!) 119/59, pulse 89, temperature 98.3 F (36.8 C), temperature source Oral, resp. rate 18, height 5\' 5"  (1.651 m), weight 72.1 kg (158 lb 15.2 oz), SpO2 100 %. Vas [place] Lower Extremity Venous (dvt)  Result Date: 08/15/2017  Lower Venous Study Indication: Edema. Risk Factors: Surgery aortic aneurysm repair. Examination Guidelines: A complete evaluation includes B-mode imaging, spectral doppler, color doppler, and power doppler as needed of all accessible portions of each vessel. Bilateral testing is considered an integral part of a complete examination. Limited examinations for reoccurring indications may be performed as noted. The reflux portion of the exam is performed with the patient in reverse Trendelenburg.  Right Venous Findings: +---------+---------------+---------+-----------+----------+-----------------+          CompressibilityPhasicitySpontaneityPropertiesSummary           +---------+---------------+---------+-----------+----------+-----------------+ CFV      Full           Yes      Yes                                    +---------+---------------+---------+-----------+----------+-----------------+ FV Prox  Full           Yes      Yes                                    +---------+---------------+---------+-----------+----------+-----------------+ FV Mid   Full           Yes      Yes                                    +---------+---------------+---------+-----------+----------+-----------------+ FV DistalFull           Yes      Yes                                     +---------+---------------+---------+-----------+----------+-----------------+ PFV      Full           Yes      Yes                                    +---------+---------------+---------+-----------+----------+-----------------+ POP      Full           Yes      Yes                                    +---------+---------------+---------+-----------+----------+-----------------+ PTV      Full                                                           +---------+---------------+---------+-----------+----------+-----------------+  PERO     Full                                                           +---------+---------------+---------+-----------+----------+-----------------+ GSV      Partial                                      Age Indeterminate +---------+---------------+---------+-----------+----------+-----------------+  Left Venous Findings: +---------+---------------+---------+-----------+----------+-------+          CompressibilityPhasicitySpontaneityPropertiesSummary +---------+---------------+---------+-----------+----------+-------+ CFV      Full           Yes      Yes                          +---------+---------------+---------+-----------+----------+-------+ FV Prox  Full           Yes      Yes                          +---------+---------------+---------+-----------+----------+-------+ FV Mid   Full           Yes      Yes                          +---------+---------------+---------+-----------+----------+-------+ FV DistalFull           Yes      Yes                          +---------+---------------+---------+-----------+----------+-------+ PFV      Full           Yes      Yes                          +---------+---------------+---------+-----------+----------+-------+ POP      Full           Yes      Yes                          +---------+---------------+---------+-----------+----------+-------+ PTV      Full                                                  +---------+---------------+---------+-----------+----------+-------+ PERO     Full                                                 +---------+---------------+---------+-----------+----------+-------+ GSV      Full                                                 +---------+---------------+---------+-----------+----------+-------+    Final Interpretation: Right: There is no evidence of deep vein thrombosis  in the lower extremity. Positive for an age interminate superficial thrombosis of the greater saphenous vein just distal to the confluence with the saphenofemoral juction. There is no evidence of a Baker's cyst. Left: There is no evidence of deep vein thrombosis in the lower extremity.There is no evidence of superficial venous thrombosis. There is no evidence of a Baker's cyst.  *See table(s) above for measurements and observations. Electronically signed by Deitra Mayo on 08/15/2017 at 4:50:03 PM.    No results for input(s): WBC, HGB, HCT, PLT in the last 72 hours. No results for input(s): NA, K, CL, GLUCOSE, BUN, CREATININE, CALCIUM in the last 72 hours.  Invalid input(s): CO CBG (last 3)  No results for input(s): GLUCAP in the last 72 hours.  Wt Readings from Last 3 Encounters:  08/31/17 72.1 kg (158 lb 15.2 oz)  08/12/17 73.8 kg (162 lb 11.2 oz)  07/06/17 76.5 kg (168 lb 11.2 oz)    Physical Exam:  HENT: Normocephalic. Atraumatic.  Eyes: EOMI. No dischage.  Neck: Trach tube in place with PMV--solid phonation Cardiac: RRR without murmur. No JVD      Respiratory: Normal effort, reasonable air movement.  No wheezes today or secretions GI: Bowel sounds are normal. She exhibits no distension.  Musculoskeletal: No tenderness with range of motion Neurological: Alert Makes good eye contact with examiner.  Followed full commands.  Motor: B/l UE 4-/5 prox to distal.  B/l LE: 3+ to 4- out of 5 proximal distal   tremors less  prominent today Psychiatric: Minimal anxiety  Assessment/Plan: 1.  Functional deficits secondary to debility which require 3+ hours per day of interdisciplinary therapy in a comprehensive inpatient rehab setting. Physiatrist is providing close team supervision and 24 hour management of active medical problems listed below. Physiatrist and rehab team continue to assess barriers to discharge/monitor patient progress toward functional and medical goals.  Function:  Bathing Bathing position Bathing activity did not occur: (per nursing only peri care done, patient refuses bathing ) Position: Shower  Bathing parts Body parts bathed by patient: Right arm, Left arm, Chest, Abdomen, Front perineal area, Right upper leg, Left upper leg, Left lower leg, Right lower leg Body parts bathed by helper: Buttocks  Bathing assist Assist Level: Touching or steadying assistance(Pt > 75%)      Upper Body Dressing/Undressing Upper body dressing   What is the patient wearing?: Pull over shirt/dress     Pull over shirt/dress - Perfomed by patient: Thread/unthread right sleeve, Thread/unthread left sleeve, Put head through opening, Pull shirt over trunk Pull over shirt/dress - Perfomed by helper: Pull shirt over trunk Button up shirt - Perfomed by patient: Thread/unthread right sleeve, Thread/unthread left sleeve Button up shirt - Perfomed by helper: Thread/unthread right sleeve, Thread/unthread left sleeve, Pull shirt around back    Upper body assist Assist Level: Supervision or verbal cues      Lower Body Dressing/Undressing Lower body dressing   What is the patient wearing?: Underwear, Pants, Shoes, [employer]  - Performed by patient: Thread/unthread right underwear leg, Thread/unthread left underwear leg, Pull underwear up/down Underwear - Performed by helper: Thread/unthread right underwear leg, Thread/unthread left underwear leg Pants- Performed by patient: Thread/unthread right pants leg,  Thread/unthread left pants leg, Pull pants up/down Pants- Performed by helper: Thread/unthread right pants leg, Thread/unthread left pants leg Non-skid slipper socks- Performed by patient: Don/doff left sock, Don/doff right sock Non-skid slipper socks- Performed by helper: Don/doff right sock, Don/doff left sock   Socks - Performed by helper:  Don/doff right sock, Don/doff left sock Shoes - Performed by patient: Don/doff right shoe, Don/doff left shoe Shoes - Performed by helper: Fasten right, Fasten left       TED Hose - Performed by helper: Don/doff right TED hose, Don/doff left TED hose  Lower body assist Assist for lower body dressing: Touching or steadying assistance (Pt > 75%)      Toileting Toileting Toileting activity did not occur: No continent bowel/bladder event Toileting steps completed by patient: Adjust clothing prior to toileting, Performs perineal hygiene, Adjust clothing after toileting Toileting steps completed by helper: Adjust clothing prior to toileting, Performs perineal hygiene, Adjust clothing after toileting Toileting Assistive Devices: Grab bar or rail  Toileting assist Assist level: Supervision or verbal cues   Transfers Chair/bed transfer   Chair/bed transfer method: Stand pivot, Ambulatory Chair/bed transfer assist level: Touching or steadying assistance (Pt > 75%) Chair/bed transfer assistive device: Medical sales representative     Max distance: 80 ft Assist level: Touching or steadying assistance (Pt > 75%)   Wheelchair Wheelchair activity did not occur: Safety/medical concerns Type: Manual   Assist Level: Dependent (Pt equals 0%)  Cognition Comprehension Comprehension assist level: Follows basic conversation/direction with no assist  Expression Expression assist level: Expresses basic 90% of the time/requires cueing < 10% of the time., Expresses basic needs/ideas: With extra time/assistive device  Social Interaction Social Interaction  assist level: Interacts appropriately 90% of the time - Needs monitoring or encouragement for participation or interaction.  Problem Solving Problem solving assist level: Solves basic 75 - 89% of the time/requires cueing 10 - 24% of the time  Memory Memory assist level: Recognizes or recalls 50 - 74% of the time/requires cueing 25 - 49% of the time, Recognizes or recalls 75 - 89% of the time/requires cueing 10 - 24% of the time  Medical Problem List and Plan:  1. Debility secondary to debility related to abdominal aneurysm/AS with AVR 07/10/2017 status post repair and subsequent cardiogenic shock    Cont CIR PT OT speech   -Continue discharge planning  2. DVT Prophylaxis/Anticoagulation: Subcutaneous Lovenox. Dopplers negative 3. Pain Management: Tylenol as needed  4. Mood:  , Seroquel 75 mg bedtime, Effexor 37.5 mg twice a day discontinue BuSpar today   --continue klonopin 0.25mg  bid for now--- still consider titration prio to dc 5. Neuropsych: This patient is capable of making decisions on her own behalf.  6. Skin/Wound Care: Routine skin checks  7. Fluids/Electrolytes/Nutrition:       on D3 and thin.    -hypokalemia due to Lasix:  potassium up to 4.4 on labs from 08/28/2017   -Intake seems improved on Megace.  Consider sending her home on this with a taper 8. Tracheostomy 07/25/2017. Downsized to #4 cuffless 08/08/2017. No current plan to decannulate due to failure with capping. Follow-up per Dr. Nelda Marseille.    -Continue nebulizers, trach care and suction if needed   -Guaifenesin to help with secretions   -continue with PMV    -Arrange trach clinic follow-up as an outpatient   -can do some "finger plugging" to simulate capping on her own--discussed with her today  9. Atrial fibrillation. Amiodarone 200 mg daily, ASA 81mg . Cardiac rate controlled  10. Dysphagia. Advanced to D3/thins   CXR   stable 11. Acute diastolic congestive heart failure. Lasix 40 mg daily, Aldactone 25 mg daily. Monitor  for any signs of fluid overload    -continue current diuretics.  BUN stable --upon labs from 08/28/2017  Filed Weights   08/29/17 0330 08/30/17 0327 08/31/17 0500  Weight: 72.1 kg (158 lb 15.2 oz) 72.3 kg (159 lb 4.5 oz) 72.1 kg (158 lb 15.2 oz)   12. Hypothyroidism. Synthroid   LOS (Days) 19 A FACE TO FACE EVALUATION WAS PERFORMED  Meredith Staggers, MD 08/31/2017 9:37 AM

## 2017-08-31 NOTE — Progress Notes (Signed)
Respiratory therapy was able to do trach care teaching with pt and husband after cap removal today.  Planned care and return demonstration by husband was not completed by nursing due to trach being capped during planned time today.  Respiratory reported that husband was able to do return demonstration.

## 2017-08-31 NOTE — Progress Notes (Signed)
Occupational Therapy Session Note  Patient Details  Name: [name] MRN: [id number] Date of Birth: [birth year]  Today's Date: 08/31/2017 OT Individual Time: 0830-1000 OT Individual Time Calculation (min): 90 min    Short Term Goals: Week 3:  OT Short Term Goal 1 (Week 3): STG=LTG secondary to ELOS  Skilled Therapeutic Interventions/Progress Updates:    OT intervention with focus on toileting and dressing tasks.  Pt amb with RW to bathroom and completed toileting tasks with close supervision.  Pt required assistance with fastening shoes but was able to complete all other dressing tasks without assistance.  Pt required multiple rest breaks throughout tasks but O2 sats>92% on RA.  Pt also engaged in simulated community reintegration activity on 2N and at [employer].  Pt amb 175' and 51' with RW at supervision level.  O2 sats>94% on RA.  Pt returned to room and remained in w/c with all needs within reach.   Therapy Documentation Precautions:  Precautions Precautions: Fall, Sternal Precaution Comments: Reinforced sternal precautions Restrictions Weight Bearing Restrictions: (P) No Other Position/Activity Restrictions: sternal precautions Pain:  Pt denies pain  See Function Navigator for Current Functional Status.   Therapy/Group: Individual Therapy  Leroy Libman 08/31/2017, 11:01 AM

## 2017-08-31 NOTE — Progress Notes (Signed)
Occupational Therapy Session Note  Patient Details  Name: [name] MRN: [id number] Date of Birth: 08/29/1940  Today's Date: 08/31/2017 OT Individual Time: 0210-0240 OT Individual Time Calculation (min): 30 min    Short Term Goals: Week 2:  OT Short Term Goal 1 (Week 2): Pt will recall and utilize sternal pre-cautions during functional tasks with mod A OT Short Term Goal 1 - Progress (Week 2): Met OT Short Term Goal 2 (Week 2): Pt will perform toileting tasks with steady A OT Short Term Goal 2 - Progress (Week 2): Met OT Short Term Goal 3 (Week 2): Pt will perform toilet transfers with steady A OT Short Term Goal 3 - Progress (Week 2): Met OT Short Term Goal 4 (Week 2): Pt will complete LB dressing tasks with min A OT Short Term Goal 4 - Progress (Week 2): Met Week 3:  OT Short Term Goal 1 (Week 3): STG=LTG secondary to ELOS  Skilled Therapeutic Interventions/Progress Updates:    1:1 Pt had an accident with her bladder in the bed. Pt came to EOB and then transferred into w/c with min A with extra time and VC for sternal precautions and proper hand placement. Pt able to perform sit to stands with min A. Pt donned clean brief and pants with more than reasonable amt of time.   Pt remained capped during session and with activity pt maintained sats >88 on RA with encouragement for pursed lip breathing.  Pt returned to bed with mod A due to fatigue. Husband in room in session. Left pt resting in the bed.   Therapy Documentation Precautions:  Precautions Precautions: Fall, Sternal Precaution Comments: Reinforced sternal precautions Restrictions Weight Bearing Restrictions: No Other Position/Activity Restrictions: sternal precautions Pain: No c/o pain  See Function Navigator for Current Functional Status.   Therapy/Group: Individual Therapy  ,  Lynsey 08/31/2017, 2:42 PM 

## 2017-08-31 NOTE — Progress Notes (Signed)
Pt has tolerated being capped for 5 hours without any issues.  Oxygen has maintained 96 to 98% on room air.

## 2017-08-31 NOTE — Consult Note (Signed)
Neuropsychological Consultation   Patient:   [name]   DOB:   May 26, 1941  MR Number:  [id number]  Location:  [facility] [id number] [place] [address] Dept: [phone] Loc: [phone]           Date of Service:   08/30/2017  Start Time:   1 PM End Time:   2 PM  Provider/Observer:  Ilean Skill, Psy.D.       Clinical Neuropsychologist       Billing Code/Service: [phone] 4 Units  Chief Complaint:    [name] is a 77 year old female with prior history of cardiovascular issues followed for issues of fusiform ascending aneurysm.  Presented 07/09/2017 with increasing size of aneurysm and had repair as well as aortic valve replacement.  Patient has had extended hospital course.  Patient also has long-standing issues with anxiety and some depression in the past that has been continuing with hospital course.    Reason for Service:  [name] was referred for neuropsychological/psychological consultation due to coping and anxiety issues along with her physical weakness.  Below is the HPI for the current admission.    HPI: [name] is a 77 y.o. right handed female who is been followed by cardiothoracic surgery for a fusiform ascending aneurysm which has increased in size over the past 1-2 years with associated moderate aortic insufficiency. Per chart review patient lives with spouse. Independent with assistive device using a cane prior to admission. One level home 4 steps to entry. Spouse can assist as needed. Presented 07/09/2017 due to increasing size of a ascending aneurysm and underwent repair of ascending aneurysm as well as aortic valve replacement 07/10/2017 per Dr. Nils Harmon. Postoperative ventilatory support. Findings a small amount of anterior mediastinal hematoma as well as orthostasis requiring pressor support. Her urine output decreased concern for tamponade and return back  to the operating room for reexploration to remove any blood/hematoma 07/11/2017 as well as findings RV function significantly decreased from earlier postoperative status and underwent implantation of percutaneous right ventricular assist device. Hospital course ventilatory support underwent tracheostomy 07/25/2017 per Dr. Nelda Harmon and has since been down sized to a #4 cuff less 08/08/2017 and tolerating PMV. No current plan to decannulate as did not tolerate capping trial. Hospital course acute blood loss anemia. Bouts of atrial fibrillation followed by cardiology services and currently maintained on amiodarone. Currently maintained on subcutaneous Lovenox for DVT prophylaxis. Bouts of hypokalemia supplement added. Dysphagia #1 pudding liquids and nasogastric tube feeds for nutritional support. Physical as well as occupational therapy evaluations completed 07/19/2017. Request made for physical medicine rehabilitation consult. Patient was admitted for a comprehensive rehabilitation program   Current Status:  The patient reports that she is feeling better about her medical status now that she is doing better from her surgical interventions.  She reports that anxiety is still an issues but these issues are improving.    She reports that having people around helps and likes to keep her door open to see people around the room in the hall way.    Behavioral Observation: [name]  presents as a 77 y.o.-year-old Right Caucasian Female who appeared her stated age. her dress was Appropriate and she was Well Groomed and her manners were Appropriate to the situation.  her participation was indicative of Appropriate and Attentive behaviors.  There were any physical disabilities noted.  she displayed an appropriate level of  cooperation and motivation.     Interactions:    Active Appropriate and Attentive  Attention:   within normal limits and attention span and concentration were age  appropriate  Memory:   abnormal; remote memory intact, recent memory mild impairments.  She does use a memory book as memory cue.  Visuo-spatial:  within normal limits  Speech (Volume):  low  Speech:   normal; The patient has a trac  Thought Process:  Coherent and Relevant  Though Content:  WNL; not suicidal and not homicidal  Orientation:   person, place, time/date and situation  Judgment:   Good  Planning:   Good  Affect:    Anxious  Mood:    Anxious  Insight:   Good  Intelligence:   normal  Marital Status/Living: Patient is married and husband still works part time job to get out of house and stay busy.   Medical History:   Past Medical History:  Diagnosis Date  . ACL tear    right  Dr. Gladstone Pih   . Adenomatous polyp   . Anxiety   . Aorta aneurysm ([place])   . Arthritis   . Ascending aortic aneurysm ([facility])    note per chart per Dr Lucianne Lei Tright 4.7 cm 04/15/2015   . B12 deficiency   . Cancer ([facility])   . Cataracts, both eyes 10/2006  . Chronic bronchitis ([place] [facility] [facility])   . Constipation   . Depression   . DUB (dysfunctional uterine bleeding) 10/96  . ETD (eustachian tube dysfunction)   . Fall   . Fibromyalgia   . GERD (gastroesophageal reflux disease)   . Glaucoma    ([facility]) Dr. Arnoldo Morale   . Glaucoma    bilaterally  . Helicobacter pylori (H. pylori)   . Hiatal hernia   . History of bronchitis   . History of frequent urinary tract infections   . History of kidney stones   . History of left shoulder fracture    pt states fell off bed and broke ball in shoulder had rod placed   . Hyperlipidemia   . Hypertension   . Hypothyroidism   . Insomnia   . Leg wound, left    healed   . MVP (mitral valve prolapse)   . Occasional tremors    left arm   . Osteoarthritis   . Osteopenia   . Osteoporosis   . Other and unspecified hyperlipidemia   . Post-menopausal   . Thyroid cancer ([facility])   . Thyroid nodule   . Tremor    Psychiatric History:  Patient has long history of anxiety  and some issues with depression.  Family Med/Psych History:  Family History  Problem Relation Age of Onset  . Cancer Mother        PANCREATIC   . Osteoporosis Mother   . Hip fracture Mother   . Heart attack Father 63       Died 68  . Heart disease Father   . Arthritis Father   . Hypertension Sister   . Cancer Sister        skin  . Hypertension Sister   . Breast cancer Neg Hx     Risk of Suicide/Violence: low Patient denies SI or HI.    Impression/DX:  Shilpa Bushee is a 77 year old female with prior history of cardiovascular issues followed for issues of fusiform ascending aneurysm.  Presented 07/09/2017 with increasing size of aneurysm and had repair as well as aortic valve replacement.  Patient has had extended hospital  course.  Patient also has long-standing issues with anxiety and some depression in the past that has been continuing with hospital course.   The patient reports that she is feeling better about her medical status now that she is doing better from her surgical interventions.  She reports that anxiety is still an issues.  She reports that she will wake up at night and not know what room she is in initially.  She reports that having people around helps and likes to keep her door open to see people around the room in the hall way.      Diagnosis:    Debility         Electronically Signed   _______________________ Ilean Skill, Psy.D.

## 2017-08-31 NOTE — Progress Notes (Signed)
Husband at bedside and performed trach care= adequate. Per husband- he as no questions other than what home care company will bring him.  I told him that home care will go over the supplies before discharge.

## 2017-08-31 NOTE — Progress Notes (Signed)
Recreational Therapy Discharge Summary Patient Details  Name: [name] MRN: [id number] Date of Birth: 05-Jul-1940 Today's Date: 08/31/2017 Comments on progress toward goals: TR sessions focused on use of leisure time once home, activity analysis with potential modifications, community pursuits, energy conservation, stress/anxiety management with both pt and her husband.  Pt is anxious to return home and is expected to discharge home 3/9 with husband/family to coordinate 24 hour supervision/assistance. Reasons for discharge: discharge from hospital Patient/family agrees with progress made and goals achieved: Yes  [name] 08/31/2017, 10:30 AM

## 2017-08-31 NOTE — Progress Notes (Signed)
Recreational Therapy Session Note  Patient Details  Name: [name] MRN: [id number] Date of Birth: 02-09-1941 Today's Date: 08/31/2017  Pain: no c/o Skilled Therapeutic Interventions/Progress Updates: Session focused on community reintegration during co-treat with OT.  Pt ambulated in and around [place] negotiating around people and obstacles with RW with contact guard assistance.  Pt ambulated 175' with RW in crowded environment, took seated rest break and then walked ~45'.  Education with pt about safety concerns during community mobility, recognizing need for rest (noticing her hear trate, breathing and fatigue levels).  Once back in her room, pt stated that she felt like getting off the floor was good for her anxiety, that she felt better.  Therapy/Group: Co-Treatment   Ely Ballen 08/31/2017, 10:25 AM

## 2017-08-31 NOTE — Progress Notes (Signed)
Speech therapy in room, trach capped.  No distress noted, sat 98-100%.  VSS

## 2017-08-31 NOTE — Discharge Summary (Signed)
Discharge summary job # [id number]

## 2017-08-31 NOTE — Progress Notes (Signed)
Came to room for capping trial, pt is out of room.  Will check back later, or call RT if you need this done sooner.

## 2017-08-31 NOTE — Progress Notes (Signed)
Social Work Patient ID: [name], female   DOB: Jan 20, 1941, 77 y.o.   MRN: [id number]   CSW met with pt to update her after team conference and then met later with pt's husband to do the same with him.  Pt is still scheduled for d/c on 09-02-17.  Pt has most needed DME at home, except for w/c which CSW will order.  CSW has asked RN and PA to let CSW know if pt will need humidified air and/or suction at home. CSW awaits this answer.  Pt's husband, nor pt, can remember the name of home care agency they have had in the past.  CSW is working to find home care agency to serve pt in her area with a trach.  Pt is eager to get home and husband feels prepared to care for her at home.  Pt's aunt plans to be with her if/when husband goes to work, but he states work is very flexible and he doesn't even have to go.  CSW remains available to assist as needed.

## 2017-08-31 NOTE — Progress Notes (Signed)
Physical Therapy Session Note  Patient Details  Name: [name] MRN: [id number] Date of Birth: April 20, 1941  Today's Date: 08/31/2017 PT Individual Time: 1000-1100 PT Individual Time Calculation (min): 60 min   Short Term Goals: Week 3:  PT Short Term Goal 1 (Week 3): =LTG d/t ELOS  Skilled Therapeutic Interventions/Progress Updates:    Pt greeted sititng upright in manual W/C ,reports went off the unit with OT and RT and is proud of herself for walking 134ft, agreeable to therapy. Pt dependently transported to therapy gym d/t precautions, stand pivot transfers W/C> mat using RW with minA to boost, performs 5 x sit<>stand x 2, 36 seconds to complete second trial. Pt requests lying rest break, transfers sit to supine with vcing. Added 1.5# cuff weights about B ankles, pt performs 1 x 10 SAQ, SLR, and heel slides, bridge with abduction using level 1 therapy band. Transitions supine to long sit with modA for trunk, steadyA to maintain sitting balance in long sit, performs 1 x 78min ankle DF stretch using sheet each LE. Pt transitions long sit to supine with modA, bridges hips to scoot edge of mat with increased time to complete, performs 1 x 65min hip flexor stretch with cuff weight about ankle. Pt transitions supine to sit with modA, ambulates around 5 cones using RW with supervision, requires seated rest break in manual W/C. Pt ambulates up/down 4 steps with step-to pattern using BUE support on rails and min guard for safety. Pt trasnfers manual W/C to apartment bed with modA sit<>supine, apartment bed is taller than bed at home. At end of session, pt returns to room in manual W/C, calls nursing staff to help to bathroom, denies other needs at this time.    Therapy Documentation Precautions:  Precautions Precautions: Fall, Sternal Precaution Comments: Reinforced sternal precautions Restrictions Weight Bearing Restrictions: No Other Position/Activity Restrictions: sternal precautions General:    Vital Signs: Therapy Vitals Pulse Rate: 96 Resp: 20 Patient Position (if appropriate): Sitting Oxygen Therapy SpO2: 100 % O2 Device: Room Air FiO2 (%): 98 % Pain: Pain Assessment Pain Score: 1    See Function Navigator for Current Functional Status.   Therapy/Group: Individual Therapy  Salvatore Decent 08/31/2017, 12:15 PM

## 2017-08-31 NOTE — Progress Notes (Signed)
Speech Language Pathology Daily Session Note  Patient Details  Name: [name] MRN: [id number] Date of Birth: 02/24/1941  Today's Date: 08/31/2017 SLP Individual Time: 1130-1200 SLP Individual Time Calculation (min): 30 min  Short Term Goals: Week 2: SLP Short Term Goal 1 (Week 2): Pt will donn and doff PMSV with Max A cues.  SLP Short Term Goal 2 (Week 2): Pt will utilize external memory aid to recall basic information with Max A cues (such as swallowing/diet recommendation information).  SLP Short Term Goal 3 (Week 2): Pt will demonstrate basic problem solving with familiar tasks and Min A cues.  SLP Short Term Goal 4 (Week 2): Pt will demonstrate selective attention for ~ 30 minutes with supervision cues.  SLP Short Term Goal 5 (Week 2): Pt will consume regular diet with thin liquids without overt s/s of aspiration with supervision cues for use of compensatory swallow strategies.   Skilled Therapeutic Interventions: Skilled treatment session focused on cognition goals facilitating increased toleration of capping trials. Mod A education provided in corelation with RT. Pt with decreased recall of capping and overall need for trach and trach function. Visual support given and left with pt. SLP also gave pt newspaper for distraction. Pt left with O2 sensor in place and all needs within reach.       Function:  Eating Eating   Modified Consistency Diet: Yes Eating Assist Level: Set up assist for;Supervision or verbal cues   Eating Set Up Assist For: Opening containers       Cognition Comprehension Comprehension assist level: Follows basic conversation/direction with no assist  Expression Expression assistive device: Talk trach valve Expression assist level: Expresses basic 90% of the time/requires cueing < 10% of the time.  Social Interaction Social Interaction assist level: Interacts appropriately 90% of the time - Needs monitoring or encouragement for participation or interaction.   Problem Solving Problem solving assist level: Solves basic 75 - 89% of the time/requires cueing 10 - 24% of the time  Memory Memory assist level: Recognizes or recalls 50 - 74% of the time/requires cueing 25 - 49% of the time    Pain    Therapy/Group: Individual Therapy  [name] 08/31/2017, 1:19 PM

## 2017-09-01 ENCOUNTER — Inpatient Hospital Stay (HOSPITAL_COMMUNITY): Payer: Medicare Other

## 2017-09-01 ENCOUNTER — Inpatient Hospital Stay (HOSPITAL_COMMUNITY): Payer: Medicare Other | Admitting: Physical Therapy

## 2017-09-01 ENCOUNTER — Inpatient Hospital Stay (HOSPITAL_COMMUNITY): Payer: Medicare Other | Admitting: Speech Pathology

## 2017-09-01 MED ORDER — ALPRAZOLAM 0.25 MG PO TABS: 0.2500 mg | ORAL_TABLET | Freq: Three times a day (TID) | ORAL | 0 refills | Status: DC | PRN

## 2017-09-01 MED ORDER — SENNOSIDES-DOCUSATE SODIUM 8.6-50 MG PO TABS: 2.0000 | ORAL_TABLET | Freq: Two times a day (BID) | ORAL | Status: DC

## 2017-09-01 MED ORDER — CLONAZEPAM 0.25 MG PO TBDP: 0.2500 mg | ORAL_TABLET | Freq: Two times a day (BID) | ORAL | 0 refills | Status: DC

## 2017-09-01 MED ORDER — LATANOPROST 0.005 % OP SOLN: 1.0000 [drp] | Freq: Every day | OPHTHALMIC | 12 refills | Status: AC

## 2017-09-01 MED ORDER — AMIODARONE HCL 200 MG PO TABS: 200.0000 mg | ORAL_TABLET | Freq: Every day | ORAL | 0 refills | Status: DC

## 2017-09-01 MED ORDER — LEVOTHYROXINE SODIUM 100 MCG PO TABS: 100.0000 ug | ORAL_TABLET | Freq: Every day | ORAL | 0 refills | Status: DC

## 2017-09-01 MED ORDER — GUAIFENESIN 100 MG/5ML PO SOLN: 5.0000 mL | Freq: Three times a day (TID) | ORAL | 0 refills | Status: DC

## 2017-09-01 MED ORDER — BUSPIRONE HCL 5 MG PO TABS: 5.0000 mg | ORAL_TABLET | Freq: Every day | ORAL | 1 refills | Status: DC

## 2017-09-01 MED ORDER — POTASSIUM CHLORIDE ER 10 MEQ PO TBCR: 20.0000 meq | EXTENDED_RELEASE_TABLET | Freq: Two times a day (BID) | ORAL | 0 refills | Status: DC

## 2017-09-01 MED ORDER — FUROSEMIDE 40 MG PO TABS: 40.0000 mg | ORAL_TABLET | Freq: Every day | ORAL | 0 refills | Status: DC

## 2017-09-01 MED ORDER — ADULT MULTIVITAMIN W/MINERALS CH: 1.0000 | ORAL_TABLET | Freq: Every day | ORAL | Status: DC

## 2017-09-01 MED ORDER — VENLAFAXINE HCL 37.5 MG PO TABS: 37.5000 mg | ORAL_TABLET | Freq: Two times a day (BID) | ORAL | 0 refills | Status: DC

## 2017-09-01 MED ORDER — ASPIRIN 325 MG PO TBEC: 325.0000 mg | DELAYED_RELEASE_TABLET | Freq: Every day | ORAL | 0 refills | Status: DC

## 2017-09-01 MED ORDER — QUETIAPINE FUMARATE 25 MG PO TABS
75.0000 mg | ORAL_TABLET | Freq: Every day | ORAL | 0 refills | Status: DC
Start: 2017-09-01 — End: 2017-10-03

## 2017-09-01 MED ORDER — SPIRONOLACTONE 25 MG PO TABS: 25.0000 mg | ORAL_TABLET | Freq: Every day | ORAL | 0 refills | Status: DC

## 2017-09-01 NOTE — Discharge Instructions (Signed)
Inpatient Rehab Discharge Instructions  [name] Discharge date and time: No discharge date for patient encounter.   Activities/Precautions/ Functional Status: Activity: activity as tolerated Diet: Soft Wound Care: keep wound clean and dry Functional status:  ___ No restrictions     ___ Walk up steps independently ___ 24/7 supervision/assistance   ___ Walk up steps with assistance ___ Intermittent supervision/assistance  ___ Bathe/dress independently ___ Walk with walker     _x__ Bathe/dress with assistance ___ Walk Independently    ___ Shower independently ___ Walk with assistance    ___ Shower with assistance ___ No alcohol     ___ Return to work/school ________  Special Instructions:  Continue tracheostomy tube with follow-up with a tracheostomy clinic/[facility] nurse practitioner [phone]   COMMUNITY REFERRALS UPON DISCHARGE:    Home Health:   PT,OT,RN,SP  [place] Phone: Date: [phone] OR [phone]   of last service:09/02/2017  Medical Equipment/Items Ordered:YONKERS SUCTION, HAND-HELD NEUBULIZER, HUMIDIFIED AIR & [facility]  Agency/Supplier:[facility]   [phone]    Continue PMV /continue finger plugging to simulate capping  My questions have been answered and I understand these instructions. I will adhere to these goals and the provided educational materials after my discharge from the hospital.  Patient/Caregiver Signature _______________________________ Date __________  Clinician Signature _______________________________________ Date __________  Please bring this form and your medication list with you to all your follow-up doctor's appointments.

## 2017-09-01 NOTE — Discharge Summary (Signed)
NAMEGENNA, [name]                ACCOUNT NO.:  [id number]  MEDICAL RECORD NO.:  [id number]  LOCATION:  [id number]                        FACILITY:  [facility]  PHYSICIAN:  Meredith Staggers, M.D.DATE OF BIRTH:  [birth year]  DATE OF ADMISSION:  08/12/2017 DATE OF DISCHARGE:  09/02/2017                              DISCHARGE SUMMARY   DISCHARGE DIAGNOSES: 1. Debilitation secondary to abdominal aneurysm with aortic stenosis     with aortic valve replacement on July 10, 2017, status post     repair and subsequent cardiogenic shock. 2. Subcutaneous Lovenox for deep venous thrombosis prophylaxis. 3. Mood with extreme anxiety. 4. Tracheostomy on July 25, 2017, downsized to a #4 Cuffless on     August 08, 2017. 5. Atrial fibrillation. 6. Dysphagia. 7. Acute diastolic congestive heart failure. 8. Hypothyroidism.  HISTORY OF PRESENT ILLNESS:  This is a 77 year old right-handed female, followed by Cardiothoracic Surgery for ascending aneurysm, which increased in size over the past 1-2 years with associated moderate aortic insufficiency.  Lives with spouse, independent with a cane prior to admission.  Presented on July 09, 2017, with increasing size of ascending aneurysm and underwent repair of aneurysm as well as aortic valve replacement on July 10, 2017, per Dr. [name].  Postoperative ventilatory support.  Findings of small amount of anterior mediastinal hematoma as well as orthostasis.  Her urine output decreased, concern for tamponade, returned to the operating room for reexploration, remove any blood hematoma as well as findings of RV function significantly decreased from earlier postoperative status and underwent implantation of percutaneous right ventricular assistive device.  Hospital course, ventilatory support.  Tracheostomy on July 25, 2017, per Dr. Nelda Marseille, downsized to a #4 Cuffless on August 08, 2017, tolerating Passy-Muir valve.  Difficulty downsizing due to  ongoing bouts of anxiety.  Noted atrial fibrillation.  Followed by Cardiology Services, maintained on amiodarone.  Subcutaneous Lovenox for DVT prophylaxis.  Dysphagia #1 pudding thick liquid diet.  The patient was admitted for comprehensive rehab program.  PAST MEDICAL HISTORY:  See discharge diagnoses.  SOCIAL HISTORY:  Lives with spouse.  Used a cane prior to admission.  FUNCTIONAL STATUS:  Upon admission to Rehab Services, max assist ambulate 34 feet, +2 total assist, +2 physical assist sit-to-stand, max total assist for activities of daily living.  PHYSICAL EXAMINATION:  VITAL SIGNS:  Blood pressure 127/79, pulse 91, temperature 97, respirations 18. GENERAL:  This is an alert female.  She is anxious, made good eye contact with examiner.  Provides her name and date of birth. HEENT:  EOM is intact. NECK:  Supple.  Nontender.  No JVD.  Tracheostomy tube in place. CARDIAC:  Rate controlled. ABDOMEN:  Soft, nontender.  Good bowel sounds. LUNGS:  Decreased breath sounds.  Clear to auscultation.  REHABILITATION HOSPITAL COURSE:  The patient was admitted to Inpatient Rehab Services.  Therapies initiated on a 3-hour daily basis, consisting of physical therapy, occupational therapy, speech therapy, and rehabilitation nursing.  The following issues were addressed during the patient's rehabilitation stay.  Pertaining to [name]'s debilitation related to abdominal aneurysm, aortic valve replacement, cardiogenic shock, she continued to participate fully with therapies with slow progressive gains.  Subcutaneous Lovenox for  DVT prophylaxis.  Venous Doppler studies, negative.  Mood and anxiety with the use of Effexor and Seroquel.  She had been on BuSpar later changed to 5 mg daily as well as Klonopin 0.25 mg b.i.d.  Blood pressures monitored.  No orthostasis. Bouts of hypokalemia, resolved with potassium supplement.  Atrial fibrillation, cardiac rate controlled.  She remained on  amiodarone. Diet had been advanced to mechanical soft, thin liquids.  She exhibited no other signs of fluid overload.  Noted tracheostomy initially on July 25, 2017, downsized to a #4 Cuffless on August 08, 2017.  The patient with ongoing bouts of anxiety when capping of tracheostomy tube. Speech therapy working with Passy-Muir valve.  The patient also is doing finger plugging to simulate capping on her own.  Plan was to continue tracheostomy tube on discharge and follow up in the Tracheostomy Clinic as an outpatient.  Full family teaching was completed in regard to her tracheostomy care.  The patient received weekly collaborative interdisciplinary team conferences to discuss estimated length of stay, family teaching, any barriers to discharge.  Ambulating 175 feet. Working with energy conservation techniques.  Stand pivot transfers, wheelchair-to-mat minimal assistance.  Transitioning long sit-to-supine with moderate assist.  Ambulates around 5 cones using rolling walker supervision.  Activities of daily living and homemaking.  Transferred wheelchair minimal assist.  Following sternal precautions.  Full family teaching was completed and plan discharge to home.  DISCHARGE MEDICATIONS: 1. Amiodarone 200 mg p.o. daily. 2. Aspirin 324 mg p.o. daily. 3. BuSpar 5 mg p.o. daily. 4. Klonopin 0.25 mg p.o. b.i.d. 5. Lasix 40 mg p.o. daily. 6. Xalatan ophthalmic solution 1 drop both eyes at bedtime. 7. Synthroid 100 mcg p.o. daily. 8. Multivitamin daily. 9. Naphazoline 1 drop both eyes twice daily. 10.Potassium chloride 20 mEq p.o. b.i.d. 11.Seroquel 75 mg p.o. at bedtime. 12.Aldactone 25 mg p.o. daily. 13.Effexor 37.5 mg p.o. b.i.d. 14.Xanax 0.25 mg p.o. t.i.d. as needed. 15. Nebulizer treatments as directed 16. Humidified air 28%  DIET:  Mechanical soft thin liquids.  FOLLOWUP:  She would follow up with Dr. Alger Simons at the Outpatient Rehab Service office as directed; Dr.  Minus Breeding, Cardiology Service, call for appointment; Dr. Tharon Aquas Trigt, Cardiothoracic Surgery; Dr. Nelda Marseille, Pulmonary Services; Dr. Redge Gainer, medical Management; Salvadore Dom, NP, Followup Tracheostomy Clinic and ultimate decannulation.  SPECIAL INSTRUCTIONS:  Continue tracheostomy tube with followup Tracheostomy Clinic nurse practitioner.  Continue with Passy-Muir valve. Continue finger plugging to simulate capping.     Lauraine Rinne, P.A.   ______________________________ Meredith Staggers, M.D.    DA/MEDQ  D:  08/31/2017  T:  09/01/2017  Job:  [id number]  cc:   Minus Breeding, MD, [facility] Salvadore Dom, NP Meredith Staggers, M.D. Ivin Poot, M.D. Chipper Herb, M.D. Providence Lanius, MD

## 2017-09-01 NOTE — Progress Notes (Addendum)
[facility]ICAL [facility]ICINE & REHABILITATION     PROGRESS NOTE    Subjective/Complaints: Did well overnight. Tolerated capping of trach for a few hours yesterday.   ROS: pt denies nausea, vomiting, diarrhea, cough, shortness of breath or chest pain, rash, blurred vision, mood change   Objective: Vital Signs:  Blood pressure 117/62, pulse 92, temperature 97.6 F (36.4 C), temperature source Oral, resp. rate 19, height 5\' 5"  (1.651 m), weight 70.3 kg (154 lb 15.7 oz), SpO2 96 %. Vas [place] Lower Extremity Venous (dvt)  Result Date: 08/15/2017  Lower Venous Study Indication: Edema. Risk Factors: Surgery aortic aneurysm repair. Examination Guidelines: A complete evaluation includes B-mode imaging, spectral doppler, color doppler, and power doppler as needed of all accessible portions of each vessel. Bilateral testing is considered an integral part of a complete examination. Limited examinations for reoccurring indications may be performed as noted. The reflux portion of the exam is performed with the patient in reverse Trendelenburg.  Right Venous Findings: +---------+---------------+---------+-----------+----------+-----------------+          CompressibilityPhasicitySpontaneityPropertiesSummary           +---------+---------------+---------+-----------+----------+-----------------+ CFV      Full           Yes      Yes                                    +---------+---------------+---------+-----------+----------+-----------------+ FV Prox  Full           Yes      Yes                                    +---------+---------------+---------+-----------+----------+-----------------+ FV Mid   Full           Yes      Yes                                    +---------+---------------+---------+-----------+----------+-----------------+ FV DistalFull           Yes      Yes                                     +---------+---------------+---------+-----------+----------+-----------------+ PFV      Full           Yes      Yes                                    +---------+---------------+---------+-----------+----------+-----------------+ POP      Full           Yes      Yes                                    +---------+---------------+---------+-----------+----------+-----------------+ PTV      Full                                                           +---------+---------------+---------+-----------+----------+-----------------+  PERO     Full                                                           +---------+---------------+---------+-----------+----------+-----------------+ GSV      Partial                                      Age Indeterminate +---------+---------------+---------+-----------+----------+-----------------+  Left Venous Findings: +---------+---------------+---------+-----------+----------+-------+          CompressibilityPhasicitySpontaneityPropertiesSummary +---------+---------------+---------+-----------+----------+-------+ CFV      Full           Yes      Yes                          +---------+---------------+---------+-----------+----------+-------+ FV Prox  Full           Yes      Yes                          +---------+---------------+---------+-----------+----------+-------+ FV Mid   Full           Yes      Yes                          +---------+---------------+---------+-----------+----------+-------+ FV DistalFull           Yes      Yes                          +---------+---------------+---------+-----------+----------+-------+ PFV      Full           Yes      Yes                          +---------+---------------+---------+-----------+----------+-------+ POP      Full           Yes      Yes                          +---------+---------------+---------+-----------+----------+-------+ PTV      Full                                                  +---------+---------------+---------+-----------+----------+-------+ PERO     Full                                                 +---------+---------------+---------+-----------+----------+-------+ GSV      Full                                                 +---------+---------------+---------+-----------+----------+-------+    Final Interpretation: Right: There is no evidence of deep vein thrombosis  in the lower extremity. Positive for an age interminate superficial thrombosis of the greater saphenous vein just distal to the confluence with the saphenofemoral juction. There is no evidence of a Baker's cyst. Left: There is no evidence of deep vein thrombosis in the lower extremity.There is no evidence of superficial venous thrombosis. There is no evidence of a Baker's cyst.  *See table(s) above for measurements and observations. Electronically signed by Deitra Mayo on 08/15/2017 at 4:50:03 PM.    No results for input(s): WBC, HGB, HCT, PLT in the last 72 hours. No results for input(s): NA, K, CL, GLUCOSE, BUN, CREATININE, CALCIUM in the last 72 hours.  Invalid input(s): CO CBG (last 3)  No results for input(s): GLUCAP in the last 72 hours.  Wt Readings from Last 3 Encounters:  09/01/17 70.3 kg (154 lb 15.7 oz)  08/12/17 73.8 kg (162 lb 11.2 oz)  07/06/17 76.5 kg (168 lb 11.2 oz)    Physical Exam:  HENT: Normocephalic. Atraumatic.  Eyes: EOMI. No dischage.  Neck: Trach tube in place with PMV--solid phonation Cardiac: RRR without murmur. No JVD       Respiratory: CTA Bilaterally without wheezes or rales. Normal effort  GI: BS +, non-tender, non-distended   Musculoskeletal: No tenderness with range of motion Neurological: Alert Makes good eye contact with examiner.  Followed full commands.  Motor: B/l UE 4-/5 prox to distal.  B/l LE: 3+ to 4- out of 5 proximal distal--stable motor/sensory exam   Tremors definitely less  prominent today Psychiatric: Minimal anxiety  Assessment/Plan: 1.  Functional deficits secondary to debility which require 3+ hours per day of interdisciplinary therapy in a comprehensive inpatient rehab setting. Physiatrist is providing close team supervision and 24 hour management of active medical problems listed below. Physiatrist and rehab team continue to assess barriers to discharge/monitor patient progress toward functional and medical goals.  Function:  Bathing Bathing position Bathing activity did not occur: (per nursing only peri care done, patient refuses bathing ) Position: Wheelchair/chair at sink  Bathing parts Body parts bathed by patient: Right arm, Left arm, Chest, Abdomen, Front perineal area, Buttocks, Right upper leg, Left upper leg, Right lower leg, Left lower leg Body parts bathed by helper: Buttocks  Bathing assist Assist Level: Touching or steadying assistance(Pt > 75%)      Upper Body Dressing/Undressing Upper body dressing   What is the patient wearing?: Pull over shirt/dress     Pull over shirt/dress - Perfomed by patient: Thread/unthread right sleeve, Thread/unthread left sleeve, Put head through opening, Pull shirt over trunk Pull over shirt/dress - Perfomed by helper: Pull shirt over trunk Button up shirt - Perfomed by patient: Thread/unthread right sleeve, Thread/unthread left sleeve Button up shirt - Perfomed by helper: Thread/unthread right sleeve, Thread/unthread left sleeve, Pull shirt around back    Upper body assist Assist Level: Supervision or verbal cues      Lower Body Dressing/Undressing Lower body dressing   What is the patient wearing?: Underwear, Pants, [employer], Shoes Underwear - Performed by patient: Thread/unthread right underwear leg, Thread/unthread left underwear leg, Pull underwear up/down Underwear - Performed by helper: Thread/unthread right underwear leg, Thread/unthread left underwear leg Pants- Performed by patient:  Thread/unthread right pants leg, Thread/unthread left pants leg, Pull pants up/down Pants- Performed by helper: Thread/unthread right pants leg, Thread/unthread left pants leg Non-skid slipper socks- Performed by patient: Don/doff left sock, Don/doff right sock Non-skid slipper socks- Performed by helper: Don/doff right sock, Don/doff left sock   Socks - Performed  by helper: Don/doff right sock, Don/doff left sock Shoes - Performed by patient: Don/doff right shoe, Don/doff left shoe Shoes - Performed by helper: Fasten right, Fasten left       TED Hose - Performed by helper: Don/doff right TED hose, Don/doff left TED hose  Lower body assist Assist for lower body dressing: Touching or steadying assistance (Pt > 75%)      Toileting Toileting Toileting activity did not occur: No continent bowel/bladder event Toileting steps completed by patient: Adjust clothing prior to toileting, Performs perineal hygiene, Adjust clothing after toileting Toileting steps completed by helper: Adjust clothing prior to toileting, Performs perineal hygiene, Adjust clothing after toileting Toileting Assistive Devices: Grab bar or rail  Toileting assist Assist level: Supervision or verbal cues   Transfers Chair/bed transfer   Chair/bed transfer method: Squat pivot, Ambulatory Chair/bed transfer assist level: Touching or steadying assistance (Pt > 75%) Chair/bed transfer assistive device: Medical sales representative     Max distance: 15 Assist level: Touching or steadying assistance (Pt > 75%)   Wheelchair Wheelchair activity did not occur: Safety/medical concerns Type: Manual   Assist Level: Dependent (Pt equals 0%)  Cognition Comprehension Comprehension assist level: Follows basic conversation/direction with no assist  Expression Expression assist level: Expresses basic 90% of the time/requires cueing < 10% of the time.  Social Interaction Social Interaction assist level: Interacts appropriately  90% of the time - Needs monitoring or encouragement for participation or interaction.  Problem Solving Problem solving assist level: Solves basic 75 - 89% of the time/requires cueing 10 - 24% of the time  Memory Memory assist level: Recognizes or recalls 50 - 74% of the time/requires cueing 25 - 49% of the time  Medical Problem List and Plan:  1. Debility secondary to debility related to abdominal aneurysm/AS with AVR 07/10/2017 status post repair and subsequent cardiogenic shock    Cont CIR PT OT speech   -Continue discharge planning for 3/9   -Patient to see Rehab MD/provider in the office for transitional care encounter in 1-2 weeks.  2. DVT Prophylaxis/Anticoagulation: Subcutaneous Lovenox. Dopplers negative 3. Pain Management: Tylenol as needed  4. Mood:  , Seroquel 75 mg bedtime, Effexor 37.5 mg twice a day.    --continue klonopin 0.25mg  bid scheduled 5. Neuropsych: This patient is capable of making decisions on her own behalf.  6. Skin/Wound Care: Routine skin checks  7. Fluids/Electrolytes/Nutrition:       on D3 and thin.    -hypokalemia due to Lasix:  potassium up to 4.4 on labs from 08/28/2017   -Intake seems improved on Megace. Can stop at discharge 8. Tracheostomy 07/25/2017. Downsized to #4 cuffless 08/08/2017. No current plan to decannulate due to failure with capping. Follow-up per Dr. Nelda Marseille.    -Continue nebulizers, trach care and suction if needed   -Guaifenesin to help with secretions   -continue with PMV    -Arrange trach clinic follow-up as an outpatient   -can do some "finger plugging" to simulate capping on her own--at home  9. Atrial fibrillation. Amiodarone 200 mg daily, ASA 81mg . Cardiac rate controlled  10. Dysphagia. Advanced to D3/thins   CXR   stable 11. Acute diastolic congestive heart failure. Lasix 40 mg daily, Aldactone 25 mg daily. Monitor for any signs of fluid overload    -continue current diuretics.  BUN stable --upon labs from 08/28/2017 [facility] Weights    08/30/17 0327 08/31/17 0500 09/01/17 0302  Weight: 72.3 kg (159 lb 4.5 oz) 72.1 kg (  158 lb 15.2 oz) 70.3 kg (154 lb 15.7 oz)   12. Hypothyroidism. Synthroid   LOS (Days) 20 A FACE TO FACE EVALUATION WAS PERFORMED  Alger Simons T, MD 09/01/2017 10:10 AM

## 2017-09-01 NOTE — Plan of Care (Signed)
  RH BOWEL ELIMINATION RH STG MANAGE BOWEL WITH ASSISTANCE Description STG Manage Bowel with [place].    09/01/2017 1810 - Progressing by Milus Mallick, RN  Patient is continent of bowel. RH BOWEL ELIMINATION RH STG MANAGE BOWEL W/MEDICATION W/ASSISTANCE Description STG Manage Bowel with Medication with Mod  I Assistance.   09/01/2017 1810 - Progressing by Milus Mallick, RN  Patient has needed medication to insure regular bowel movements RH SKIN INTEGRITY RH STG SKIN FREE OF INFECTION/BREAKDOWN Description Mod I  09/01/2017 1810 - Progressing by Milus Mallick, RN  Patient's skin is very friable, however new injuries/breakdown are not apparant.

## 2017-09-01 NOTE — Progress Notes (Signed)
Speech Language Pathology Discharge Summary  Patient Details  Name: [name] MRN: [id number] Date of Birth: [birth year]  Today's Date: 09/01/2017 SLP Individual Time: 1300-1400 SLP Individual Time Calculation (min): 60 min   Skilled Therapeutic Interventions:  Skilled treatment session focused on completion of education with pt and husband on PMSV, PO textures and cognitive deficits. All voiced understanding all questions answered to pr and husband satisfaction. Pt tolerating PMSV during all waking hours.    Patient has met 7 of 7 long term goals.  Patient to discharge at [facility] level.   Clinical Impression/Discharge Summary:   Pt has made good progress during skilled ST sessions and as a result she has met 7 of 7 LTGs. Pt has made progress in swallow function, toleration of PMSV and in cognitive function. However, pt has not been able to tolerate extended capping d/t increase in anxiety. Pt also requires Mod A cues for recall of information. Pt would continue to benefit from skilled HHST to increase recall of information related to trach care.   Care Partner:  Caregiver Able to Provide Assistance: Yes  Type of Caregiver Assistance: Cognitive  Recommendation:  24 hour supervision/assistance;Home Health SLP  Rationale for SLP Follow Up: Maximize cognitive function and independence;Reduce caregiver burden   Equipment:     Reasons for discharge: Discharged from hospital   Patient/Family Agrees with Progress Made and Goals Achieved: Yes   Function:  Eating Eating   Modified Consistency Diet: Yes Eating Assist Level: Set up assist for;Supervision or verbal cues   Eating Set Up Assist For: Opening containers       Cognition Comprehension Comprehension assist level: Follows basic conversation/direction with extra time/assistive device  Expression Expression assistive device: Talk trach valve Expression assist level: Expresses basic 90% of the time/requires cueing < 10%  of the time.  Social Interaction Social Interaction assist level: Interacts appropriately 90% of the time - Needs monitoring or encouragement for participation or interaction.  Problem Solving Problem solving assist level: Solves basic 75 - 89% of the time/requires cueing 10 - 24% of the time  Memory Memory assist level: Recognizes or recalls 50 - 74% of the time/requires cueing 25 - 49% of the time   Vina Byrd 09/01/2017, 1:30 PM

## 2017-09-01 NOTE — Progress Notes (Signed)
Occupational Therapy Session Note  Patient Details  Name: KEYLAH DARWISH MRN: [id number] Date of Birth: [birth year]  Today's Date: 09/01/2017 OT Individual Time: 0930-1030 OT Individual Time Calculation (min): 60 min    Short Term Goals: Week 3:  OT Short Term Goal 1 (Week 3): STG=LTG secondary to ELOS  Skilled Therapeutic Interventions/Progress Updates:    OT intervention with focus on bathing/dressing tasks, functional transfers to shower and toilet, toileting tasks, sit<>stand, standing balance, and safety awareness to increase independence with BADLs. Pt continues to require min verbal cue for sternal precautions.  Pt requires min A for LB bathing/dressing tasks but completed toilet transfers and toileting tasks at supervision level.  Pt requires min verbal cues for energy conservation and to take rest breaks with fatigue.  Pt continues to exhibit increased anxiety with SOB and requires min verbal cues for relaxation strategies and to rest.  Pt remained in w/c with QRB in place and all needs within reach.   Therapy Documentation Precautions:  Precautions Precautions: Fall, Sternal Precaution Comments: Reinforced sternal precautions Restrictions Weight Bearing Restrictions: No Other Position/Activity Restrictions: sternal precautions Pain:  Pt denies pain  See Function Navigator for Current Functional Status.   Therapy/Group: Individual Therapy  Leroy Libman 09/01/2017, 12:14 PM

## 2017-09-01 NOTE — Progress Notes (Signed)
Physical Therapy Session Note  Patient Details  Name: [name] MRN: [id number] Date of Birth: 10/17/1940  Today's Date: 09/01/2017 PT Individual Time: 1600-1630 PT Individual Time Calculation (min): 30 min   Short Term Goals: Week 3:  PT Short Term Goal 1 (Week 3): =LTG d/t ELOS  Skilled Therapeutic Interventions/Progress Updates:    no c/o pain.  Session focus on pt education and activity tolerance.  Pt reporting having trouble sleeping 2/2 anxiety.  Provided emotional support and suggestions for relaxation techniques when starting to feel anxious.  Pt requires min assist for sit>stand to maintain forward weight shift.  Gait training 2x150' with RW and close supervision, focus on activity tolerance.  Pt positioned in bed at end of session with call bell in reach and needs met.   Therapy Documentation Precautions:  Precautions Precautions: Fall, Sternal Precaution Comments: Reinforced sternal precautions Restrictions Weight Bearing Restrictions: No Other Position/Activity Restrictions: sternal precautions   See Function Navigator for Current Functional Status.   Therapy/Group: Individual Therapy  Michel Santee 09/01/2017, 4:57 PM

## 2017-09-01 NOTE — Progress Notes (Signed)
Occupational Therapy Discharge Summary  Patient Details  Name: [name] MRN: [id number] Date of Birth: 15-Oct-1940  Patient has met 10 of 10 long term goals due to improved activity tolerance, improved balance, postural control, ability to compensate for deficits, improved attention, improved awareness and improved coordination.  Pt made steady progress with BADLs and functional transfers during this admission.  Pt requires min A for LB bathing/dressing tasks and shower transfers but completes all other BADLs at supervision level inclduing toilet transfers and toileting.  Pt also requires min verbal cues for safety awareness and recalling daily activities.  Pt Patient to discharge at [facility] Assist level.  Pt's husband has been present and participated in therapy.  Pt and husband verbalized understanding of recommendation for 24 hour supervision/assistance. Patient's care partner is independent to provide the necessary physical and cognitive assistance at discharge.    Recommendation:  Patient will benefit from ongoing skilled OT services in home health setting to continue to advance functional skills in the area of BADL.  Equipment: No equipment provided  Reasons for discharge: treatment goals met and discharge from hospital  Patient/family agrees with progress made and goals achieved: Yes  OT Discharge Vision Baseline Vision/History: Wears glasses;Glaucoma Wears Glasses: Reading only Patient Visual Report: No change from baseline Vision Assessment?: No apparent visual deficits Perception  Perception: Within Functional Limits Praxis Praxis: Intact Cognition Overall Cognitive Status: Impaired/Different from baseline Arousal/Alertness: Awake/alert Orientation Level: Oriented X4 Attention: Selective Focused Attention: Appears intact Sustained Attention: Appears intact Selective Attention: Impaired Memory: Impaired Memory Impairment: Decreased short term memory;Decreased  recall of new information Decreased Short Term Memory: Verbal basic;Functional basic Awareness Impairment: Emergent impairment Problem Solving: Impaired Problem Solving Impairment: Verbal basic;Functional basic Safety/Judgment: Impaired Comments: Decreased safety awareness Sensation Sensation Light Touch: Appears Intact Proprioception: Appears Intact Motor  Motor Motor: Within Functional Limits Mobility     Trunk/Postural Assessment  Cervical Assessment Cervical Assessment: Within Functional Limits Thoracic Assessment Thoracic Assessment: Exceptions to WFL(kyphosis) Lumbar Assessment Lumbar Assessment: Exceptions to WFL(posterior pelvic tilt)  Balance Static Sitting Balance Static Sitting - Level of Assistance: 5: Stand by assistance Dynamic Sitting Balance Dynamic Sitting - Level of Assistance: 5: Stand by assistance Extremity/Trunk Assessment RUE Assessment RUE Assessment: Within Functional Limits LUE Assessment LUE Assessment: Within Functional Limits   See Function Navigator for Current Functional Status.  Leotis Shames [facility] 09/01/2017, 12:17 PM

## 2017-09-01 NOTE — Progress Notes (Signed)
Physical Therapy Discharge Summary  Patient Details  Name: [name] MRN: [id number] Date of Birth: Jan 23, 1941  Today's Date: 09/01/2017 PT Individual Time: 1100-1200 PT Individual Time Calculation (min): 60 min    Patient has met 5 of 7 long term goals due to improved activity tolerance, improved balance, improved postural control, increased strength and improved coordination.  Patient to discharge at an ambulatory level [place].   Patient's care partner is independent to provide the necessary physical assistance at discharge.  Reasons goals not met: Pt inconsistently performs sit<>stand with supervision for bed>chair transfers and car trasnfer, requires intermittent minA to boost, increased need for assistance with presence of fatigue. Husband is present and able to assist as needed at home.  Recommendation:  Patient will benefit from ongoing skilled PT services in home health setting to continue to advance safe functional mobility, address ongoing impairments in BLE strength/flexibility, postural control, endurance, and minimize fall risk.  Equipment: 18 x 18 manual W/C  Reasons for discharge: treatment goals met  Patient/family agrees with progress made and goals achieved: Yes  PT Discharge Precautions/Restrictions Precautions Precautions: Fall;Sternal Restrictions Weight Bearing Restrictions: No Vital Signs Oxygen Therapy SpO2: 96 % O2 Device: Room Air FiO2 (%): 21 % Vision/Perception  Perception Perception: Within Functional Limits Praxis Praxis: Intact  Cognition Overall Cognitive Status: Impaired/Different from baseline Arousal/Alertness: Awake/alert Orientation Level: Oriented X4 Attention: Selective Focused Attention: Appears intact Sustained Attention: Appears intact Selective Attention: Impaired Memory: Impaired Memory Impairment: Decreased short term memory;Decreased recall of new information Decreased Short Term Memory: Verbal basic;Functional  basic Awareness Impairment: Emergent impairment Problem Solving: Impaired Problem Solving Impairment: Verbal basic;Functional basic Safety/Judgment: Impaired Comments: Decreased safety awareness Sensation Sensation Light Touch: Appears Intact Proprioception: Appears Intact Motor  Motor Motor: Within Functional Limits  Mobility Transfers Transfers: Yes Stand to Sit: 4: Min assist Stand to Sit Details (indicate cue type and reason): Verbal cues for technique;Verbal cues for sequencing Locomotion  Ambulation Ambulation: Yes Gait Gait: Yes Gait velocity: Decreased High Level Ambulation High Level Ambulation: Side stepping Side Stepping: at EOB with supervision and use of RW Stairs / Additional Locomotion Stairs: Yes Stairs Assistance: 4: Min assist Stair Management Technique: Two rails;Step to pattern Number of Stairs: 4 Height of Stairs: 6 Wheelchair Mobility Wheelchair Mobility: Yes Wheelchair Assistance: 1: +1 Total assist(sternal precautions) Wheelchair Parts Management: Needs assistance  Trunk/Postural Assessment  Cervical Assessment Cervical Assessment: Within Functional Limits Thoracic Assessment Thoracic Assessment: Exceptions to WFL(kyphosis) Lumbar Assessment Lumbar Assessment: Exceptions to WFL(posterior pelvic tilt)  Balance Static Sitting Balance Static Sitting - Level of Assistance: 5: Stand by assistance Dynamic Sitting Balance Dynamic Sitting - Level of Assistance: 5: Stand by assistance Extremity Assessment  RUE Assessment RUE Assessment: Within Functional Limits LUE Assessment LUE Assessment: Within Functional Limits RLE Assessment RLE Assessment: Within Functional Limits LLE Assessment LLE Assessment: Within Functional Limits   Pt greeted sitting upright in manual W/C in room, husband present but did not want to leave pt room, agreeable to therapy. Pt transported to therapy gym dependently d/t sternal precautions, ambulates up/down 4 6" steps  using BUE support on handrails, minA for balance with step-to pattern. Pt ambulates approximately 38f to therapy apartment using RW with min guard, performs ambulatory transfer to/from recliner and bed using RW with min guard, [facility] to supine with minA for LE management. Ambulates additional 1039fto simulated car, transfers into car with supervision/verbal cuing, sit>stand from car minA for boosting, pt reports truck is taller  and husband able to assist. Pt performs BLE strengthening in sitting with 1.5# ankle weights, performs sit<>stand x 5 from W/C using RW with supervision, requires brief rest periods between reps, performs 2 x 1 min standing at RW with B toes elevated on red wedge for B heel cord stretch while performing peg activity reaching just outside of BOS to encourage anterior lean. Attempted TUG test however pt unable to perform sit<>stand from W/C, stand with minA to boost using RW, ambulates approximately 57f in 30secs, transported to room dependently in manual W/C, all needs within reach.  See Function Navigator for Current Functional Status.  MSalvatore Decent3/01/2018, 12:19 PM

## 2017-09-02 NOTE — Progress Notes (Signed)
[facility] PHYSICAL MEDICINE & REHABILITATION     PROGRESS NOTE    Subjective/Complaints: Pt seen lying in bed this morning. She slept well overnight. She is ready for discharge today.  ROS: Denies CP, SOB, nausea, vomiting, diarrhea.   Objective: Vital Signs:  Blood pressure (!) 106/58, pulse 95, temperature 98.2 F (36.8 C), temperature source Oral, resp. rate 18, height 5\' 5"  (1.651 m), weight 71.7 kg (158 lb 1.1 oz), SpO2 97 %. Vas [place] Lower Extremity Venous (dvt)  Result Date: 08/15/2017  Lower Venous Study Indication: Edema. Risk Factors: Surgery aortic aneurysm repair. Examination Guidelines: A complete evaluation includes B-mode imaging, spectral doppler, color doppler, and power doppler as needed of all accessible portions of each vessel. Bilateral testing is considered an integral part of a complete examination. Limited examinations for reoccurring indications may be performed as noted. The reflux portion of the exam is performed with the patient in reverse Trendelenburg.  Right Venous Findings: +---------+---------------+---------+-----------+----------+-----------------+          CompressibilityPhasicitySpontaneityPropertiesSummary           +---------+---------------+---------+-----------+----------+-----------------+ CFV      Full           Yes      Yes                                    +---------+---------------+---------+-----------+----------+-----------------+ FV Prox  Full           Yes      Yes                                    +---------+---------------+---------+-----------+----------+-----------------+ FV Mid   Full           Yes      Yes                                    +---------+---------------+---------+-----------+----------+-----------------+ FV DistalFull           Yes      Yes                                    +---------+---------------+---------+-----------+----------+-----------------+ PFV      Full           Yes      Yes                                     +---------+---------------+---------+-----------+----------+-----------------+ POP      Full           Yes      Yes                                    +---------+---------------+---------+-----------+----------+-----------------+ PTV      Full                                                           +---------+---------------+---------+-----------+----------+-----------------+  PERO     Full                                                           +---------+---------------+---------+-----------+----------+-----------------+ GSV      Partial                                      Age Indeterminate +---------+---------------+---------+-----------+----------+-----------------+  Left Venous Findings: +---------+---------------+---------+-----------+----------+-------+          CompressibilityPhasicitySpontaneityPropertiesSummary +---------+---------------+---------+-----------+----------+-------+ CFV      Full           Yes      Yes                          +---------+---------------+---------+-----------+----------+-------+ FV Prox  Full           Yes      Yes                          +---------+---------------+---------+-----------+----------+-------+ FV Mid   Full           Yes      Yes                          +---------+---------------+---------+-----------+----------+-------+ FV DistalFull           Yes      Yes                          +---------+---------------+---------+-----------+----------+-------+ PFV      Full           Yes      Yes                          +---------+---------------+---------+-----------+----------+-------+ POP      Full           Yes      Yes                          +---------+---------------+---------+-----------+----------+-------+ PTV      Full                                                 +---------+---------------+---------+-----------+----------+-------+ PERO      Full                                                 +---------+---------------+---------+-----------+----------+-------+ GSV      Full                                                 +---------+---------------+---------+-----------+----------+-------+    Final Interpretation: Right: There is no evidence of deep vein thrombosis  in the lower extremity. Positive for an age interminate superficial thrombosis of the greater saphenous vein just distal to the confluence with the saphenofemoral juction. There is no evidence of a Baker's cyst. Left: There is no evidence of deep vein thrombosis in the lower extremity.There is no evidence of superficial venous thrombosis. There is no evidence of a Baker's cyst.  *See table(s) above for measurements and observations. Electronically signed by Deitra Mayo on 08/15/2017 at 4:50:03 PM.    No results for input(s): WBC, HGB, HCT, PLT in the last 72 hours. No results for input(s): NA, K, CL, GLUCOSE, BUN, CREATININE, CALCIUM in the last 72 hours.  Invalid input(s): CO CBG (last 3)  No results for input(s): GLUCAP in the last 72 hours.  Wt Readings from Last 3 Encounters:  09/02/17 71.7 kg (158 lb 1.1 oz)  08/12/17 73.8 kg (162 lb 11.2 oz)  07/06/17 76.5 kg (168 lb 11.2 oz)    Physical Exam:  HENT: Normocephalic. Atraumatic.  Eyes: EOMI. No dischage.  Neck: + Trach tube in place with PMV Cardiac: RRR. No JVD       Respiratory: CTA Bilaterally. Normal effort  GI: BS +, non-distended   Musculoskeletal: No tenderness with range of motion Neurological: Alert Makes good eye contact with examiner.  Followed full commands.  Motor: B/l UE 4-/5 prox to distal.  B/l LE: 3+ to 4- out of 5 proximal distal (unchanged) Upper extremity tremors  Psychiatric: Minimal anxiety  Assessment/Plan: 1.  Functional deficits secondary to debility which require 3+ hours per day of interdisciplinary therapy in a comprehensive inpatient rehab  setting. Physiatrist is providing close team supervision and 24 hour management of active medical problems listed below. Physiatrist and rehab team continue to assess barriers to discharge/monitor patient progress toward functional and medical goals.  Function:  Bathing Bathing position Bathing activity did not occur: (per nursing only peri care done, patient refuses bathing ) Position: Shower  Bathing parts Body parts bathed by patient: Right arm, Left arm, Chest, Abdomen, Front perineal area, Buttocks, Right upper leg, Left upper leg, Right lower leg, Left lower leg Body parts bathed by helper: Buttocks  Bathing assist Assist Level: Touching or steadying assistance(Pt > 75%)      Upper Body Dressing/Undressing Upper body dressing   What is the patient wearing?: Pull over shirt/dress     Pull over shirt/dress - Perfomed by patient: Thread/unthread right sleeve, Thread/unthread left sleeve, Put head through opening, Pull shirt over trunk Pull over shirt/dress - Perfomed by helper: Pull shirt over trunk Button up shirt - Perfomed by patient: Thread/unthread right sleeve, Thread/unthread left sleeve Button up shirt - Perfomed by helper: Thread/unthread right sleeve, Thread/unthread left sleeve, Pull shirt around back    Upper body assist Assist Level: Supervision or verbal cues      Lower Body Dressing/Undressing Lower body dressing   What is the patient wearing?: Underwear, Pants, [employer], Shoes Underwear - Performed by patient: Thread/unthread right underwear leg, Thread/unthread left underwear leg, Pull underwear up/down Underwear - Performed by helper: Thread/unthread right underwear leg, Thread/unthread left underwear leg Pants- Performed by patient: Thread/unthread right pants leg, Thread/unthread left pants leg, Pull pants up/down Pants- Performed by helper: Thread/unthread right pants leg, Thread/unthread left pants leg Non-skid slipper socks- Performed by patient: Don/doff  left sock, Don/doff right sock Non-skid slipper socks- Performed by helper: Don/doff right sock, Don/doff left sock   Socks - Performed by helper: Don/doff right sock, Don/doff left sock Shoes - Performed by patient:  Don/doff right shoe, Don/doff left shoe Shoes - Performed by helper: Fasten right, Fasten left       TED Hose - Performed by helper: Don/doff right TED hose, Don/doff left TED hose  Lower body assist Assist for lower body dressing: Touching or steadying assistance (Pt > 75%)      Toileting Toileting Toileting activity did not occur: No continent bowel/bladder event Toileting steps completed by patient: Adjust clothing prior to toileting, Performs perineal hygiene, Adjust clothing after toileting Toileting steps completed by helper: Adjust clothing prior to toileting, Performs perineal hygiene, Adjust clothing after toileting Toileting Assistive Devices: Grab bar or rail  Toileting assist Assist level: Touching or steadying assistance (Pt.75%)   Transfers Chair/bed transfer   Chair/bed transfer method: Ambulatory Chair/bed transfer assist level: Touching or steadying assistance (Pt > 75%) Chair/bed transfer assistive device: Medical sales representative     Max distance: 15 Assist level: Touching or steadying assistance (Pt > 75%)   Wheelchair Wheelchair activity did not occur: Safety/medical concerns Type: Manual   Assist Level: Dependent (Pt equals 0%)  Cognition Comprehension Comprehension assist level: Follows basic conversation/direction with extra time/assistive device  Expression Expression assist level: Expresses basic 90% of the time/requires cueing < 10% of the time.  Social Interaction Social Interaction assist level: Interacts appropriately 90% of the time - Needs monitoring or encouragement for participation or interaction.  Problem Solving Problem solving assist level: Solves basic 75 - 89% of the time/requires cueing 10 - 24% of the time   Memory Memory assist level: Recognizes or recalls 50 - 74% of the time/requires cueing 25 - 49% of the time  Medical Problem List and Plan:  1. Debility secondary to debility related to abdominal aneurysm/AS with AVR 07/10/2017 status post repair and subsequent cardiogenic shock    DC today   -Continue discharge planning for 3/9   -Patient to see Rehab MD/provider in the office for transitional care encounter in 1-2 weeks.  2. DVT Prophylaxis/Anticoagulation: Subcutaneous Lovenox. Dopplers negative 3. Pain Management: Tylenol as needed  4. Mood:  , Seroquel 75 mg bedtime, Effexor 37.5 mg twice a day.    --continue klonopin 0.25mg  bid scheduled 5. Neuropsych: This patient is capable of making decisions on her own behalf.  6. Skin/Wound Care: Routine skin checks  7. Fluids/Electrolytes/Nutrition:       on D3 and thin.    -hypokalemia due to Lasix:  potassium up to 4.4 on labs from 08/28/2017   -Intake seems improved on Megace. Can stop at discharge 8. Tracheostomy 07/25/2017. Downsized to #4 cuffless 08/08/2017. No current plan to decannulate due to failure with capping. Follow-up per Dr. Nelda Marseille.    -Continue nebulizers, trach care and suction if needed   -Guaifenesin to help with secretions   -continue with PMV    -Arrange trach clinic follow-up as an outpatient   -can do some "finger plugging" to simulate capping on her own at home  9. Atrial fibrillation. Amiodarone 200 mg daily, ASA 81mg . Cardiac rate controlled  10. Dysphagia. Advanced to D3/thins   CXR   stable 11. Acute diastolic congestive heart failure. Lasix 40 mg daily, Aldactone 25 mg daily. Monitor for any signs of fluid overload    -continue current diuretics.  BUN stable --upon labs from 08/28/2017 [facility] Weights   08/31/17 0500 09/01/17 0302 09/02/17 0159  Weight: 72.1 kg (158 lb 15.2 oz) 70.3 kg (154 lb 15.7 oz) 71.7 kg (158 lb 1.1 oz)   12. Hypothyroidism. Synthroid  LOS (Days) 21 A FACE TO FACE EVALUATION WAS  PERFORMED  Dorinda Stehr Lorie Phenix, MD 09/02/2017 9:15 AM

## 2017-09-04 ENCOUNTER — Telehealth: Payer: Self-pay | Admitting: Cardiology

## 2017-09-04 DIAGNOSIS — Z9181 History of falling: Secondary | ICD-10-CM | POA: Diagnosis not present

## 2017-09-04 DIAGNOSIS — R1310 Dysphagia, unspecified: Secondary | ICD-10-CM | POA: Diagnosis not present

## 2017-09-04 DIAGNOSIS — H409 Unspecified glaucoma: Secondary | ICD-10-CM | POA: Diagnosis not present

## 2017-09-04 DIAGNOSIS — M797 Fibromyalgia: Secondary | ICD-10-CM | POA: Diagnosis not present

## 2017-09-04 DIAGNOSIS — E039 Hypothyroidism, unspecified: Secondary | ICD-10-CM | POA: Diagnosis not present

## 2017-09-04 DIAGNOSIS — Z85850 Personal history of malignant neoplasm of thyroid: Secondary | ICD-10-CM | POA: Diagnosis not present

## 2017-09-04 DIAGNOSIS — Z952 Presence of prosthetic heart valve: Secondary | ICD-10-CM | POA: Diagnosis not present

## 2017-09-04 DIAGNOSIS — Z430 Encounter for attention to tracheostomy: Secondary | ICD-10-CM | POA: Diagnosis not present

## 2017-09-04 DIAGNOSIS — I110 Hypertensive heart disease with heart failure: Secondary | ICD-10-CM | POA: Diagnosis not present

## 2017-09-04 DIAGNOSIS — I480 Paroxysmal atrial fibrillation: Secondary | ICD-10-CM | POA: Diagnosis not present

## 2017-09-04 DIAGNOSIS — I5030 Unspecified diastolic (congestive) heart failure: Secondary | ICD-10-CM | POA: Diagnosis not present

## 2017-09-04 DIAGNOSIS — M1990 Unspecified osteoarthritis, unspecified site: Secondary | ICD-10-CM | POA: Diagnosis not present

## 2017-09-04 NOTE — Telephone Encounter (Signed)
Closed Encounter  °

## 2017-09-05 NOTE — Progress Notes (Signed)
Social Work Discharge Note  The overall goal for the admission was met for:   Discharge location: Yes - home  Length of Stay: Yes - 21 days  Discharge activity level: Yes - minimal assistance  Home/community participation: Yes  Services provided included: MD, RD, PT, OT, SLP, RN, TR, Pharmacy, Neuropsych and SW  Financial Services: Private Insurance: [employer]  Follow-up services arranged: Home Health: PT/OT/ST/RN from [facility], DME: [phone]" wheelchair with basic cushion from [place] and Patient/Family has no preference for HH/DME agencies  Comments (or additional information):  Pt's husband and pt's aunt to provide 24/7 supervision/min A as needed.  Family education completed with PT/OT/ST/RN and f/u services at home from [employer], as well.  Patient/Family verbalized understanding of follow-up arrangements: Yes  Individual responsible for coordination of the follow-up plan: pt's husband  Confirmed correct DME delivered: Trey Sailors 09/05/2017    Ivon Roedel, Silvestre Mesi

## 2017-09-06 ENCOUNTER — Telehealth: Payer: Self-pay | Admitting: Registered Nurse

## 2017-09-06 NOTE — Telephone Encounter (Signed)
Transitional Care call Transitional Care Call Completed, Appointment Confirmed, Address Confirmed, New Patient Packet Mailed  Patient name: [name] DOB: 1940-12-24 1. Are you/is patient experiencing any problems since coming home? No a. Are there any questions regarding any aspect of care? No 2. Are there any questions regarding medications administration/dosing? No a. Are meds being taken as prescribed? Yes b. "Patient should review meds with caller to confirm" Medication List Reviewed 3. Have there been any falls? No 4. Has Home Health been to the house and/or have they contacted you? Yes, [facility] [place] a. If not, have you tried to contact them? NA b. Can we help you contact them? No 5. Are bowels and bladder emptying properly? Yes a. Are there any unexpected incontinence issues? No b. If applicable, is patient following bowel/bladder programs? NA 6. Any fevers, problems with breathing, unexpected pain? No 7. Are there any skin problems or new areas of breakdown? No 8. Has the patient/family member arranged specialty MD follow up (ie cardiology/neurology/renal/surgical/etc.)?  Yes a. Can we help arrange? NA 9. Does the patient need any other services or support that we can help arrange? No 10. Are caregivers following through as expected in assisting the patient? Yes 11. Has the patient quit smoking, drinking alcohol, or using drugs as recommended? [name] denies smoking, drinking alcohol or using illicit drugs.  Appointment date/time 09/08/2017 arrival time 1:00 for 1:20 appointment with Danella Sensing ANP-C at [facility]

## 2017-09-08 ENCOUNTER — Encounter: Payer: Medicare Other | Admitting: Registered Nurse

## 2017-09-08 ENCOUNTER — Telehealth: Payer: Self-pay | Admitting: Family Medicine

## 2017-09-08 NOTE — Telephone Encounter (Signed)
Pharmacy aware of allergies

## 2017-09-11 ENCOUNTER — Telehealth: Payer: Self-pay | Admitting: *Deleted

## 2017-09-11 NOTE — Telephone Encounter (Signed)
VM from Speech therapist w/ [facility] between potassium & spironolactone Please advise

## 2017-09-11 NOTE — Telephone Encounter (Signed)
[name] [facility] calling regarding pt being on Alprazolam & Clonazepam together Pt has appt on 09/14/17

## 2017-09-11 NOTE — Telephone Encounter (Signed)
These meds came from PA at the hospital - she was not on these prior to hospitalization.  [name] is aware of appt on Thursday and that we will discuss meds then.

## 2017-09-11 NOTE — Telephone Encounter (Signed)
Both medications are potassium sparing.  Please have patient's husband bring her to the back door and get a BMP checked.  She should not come in because I do not want to risk the chance of getting her infected with the flu.

## 2017-09-12 ENCOUNTER — Other Ambulatory Visit: Payer: Self-pay | Admitting: *Deleted

## 2017-09-12 DIAGNOSIS — I712 Thoracic aortic aneurysm, without rupture: Secondary | ICD-10-CM

## 2017-09-12 DIAGNOSIS — I7121 Aneurysm of the ascending aorta, without rupture: Secondary | ICD-10-CM

## 2017-09-12 NOTE — Telephone Encounter (Signed)
Pt's husband notified of recommendation Verbalizes understanding

## 2017-09-13 ENCOUNTER — Ambulatory Visit (INDEPENDENT_AMBULATORY_CARE_PROVIDER_SITE_OTHER): Payer: Self-pay | Admitting: Cardiothoracic Surgery

## 2017-09-13 ENCOUNTER — Ambulatory Visit
Admission: RE | Admit: 2017-09-13 | Discharge: 2017-09-13 | Disposition: A | Discharge status: Home / Self Care | Payer: Medicare Other | Source: Ambulatory Visit | Attending: Cardiothoracic Surgery | Admitting: Cardiothoracic Surgery

## 2017-09-13 ENCOUNTER — Other Ambulatory Visit: Payer: Self-pay | Admitting: Physical Medicine & Rehabilitation

## 2017-09-13 ENCOUNTER — Telehealth: Payer: Self-pay

## 2017-09-13 ENCOUNTER — Encounter: Payer: Medicare Other | Admitting: Registered Nurse

## 2017-09-13 ENCOUNTER — Ambulatory Visit: Payer: Medicare Other | Admitting: Cardiology

## 2017-09-13 VITALS — BP 118/79 | HR 100 | Resp 20 | Ht 65.0 in | Wt 155.0 lb

## 2017-09-13 DIAGNOSIS — Z9889 Other specified postprocedural states: Secondary | ICD-10-CM

## 2017-09-13 DIAGNOSIS — J9811 Atelectasis: Secondary | ICD-10-CM | POA: Diagnosis not present

## 2017-09-13 DIAGNOSIS — Z952 Presence of prosthetic heart valve: Secondary | ICD-10-CM

## 2017-09-13 DIAGNOSIS — I712 Thoracic aortic aneurysm, without rupture: Secondary | ICD-10-CM

## 2017-09-13 DIAGNOSIS — I7121 Aneurysm of the ascending aorta, without rupture: Secondary | ICD-10-CM

## 2017-09-13 MED ORDER — ALPRAZOLAM 0.25 MG PO TABS: 0.2500 mg | ORAL_TABLET | Freq: Three times a day (TID) | ORAL | Status: DC | PRN

## 2017-09-13 NOTE — Telephone Encounter (Signed)
Sent phone message to provider to see if we can fill this medication due to apt cancellations for transitional care apt's.

## 2017-09-13 NOTE — Telephone Encounter (Signed)
Recieved electronic medication refill request for xanax for this patient.  Patient was to be seen today for transitional care appointment, apt was cancelled due to "patient cannot make it"  No further appointments scheduled.  Is it ok to refill this medication, please advise

## 2017-09-13 NOTE — Telephone Encounter (Signed)
Can RF 0.25mg  tid prn #30. Further RF requires appt. She is also on scheduled klonopin

## 2017-09-14 ENCOUNTER — Encounter: Payer: Self-pay | Admitting: Cardiothoracic Surgery

## 2017-09-14 ENCOUNTER — Ambulatory Visit: Payer: Medicare Other | Admitting: Family Medicine

## 2017-09-14 MED ORDER — ALPRAZOLAM 0.25 MG PO TABS: 0.2500 mg | ORAL_TABLET | Freq: Three times a day (TID) | ORAL | 0 refills | Status: DC | PRN

## 2017-09-14 NOTE — Telephone Encounter (Signed)
Medication ordered

## 2017-09-14 NOTE — Progress Notes (Signed)
PCP is Chipper Herb, MD Referring Provider is Chipper Herb, MD  Chief Complaint  Patient presents with  . Routine Post Op    f/u from surgery with CXR  s/p Mediastinal exploration after resection and grafting of ascending aneurysm with aortic valve replacement     HPI: The patient is 77 years old with COPD and history of smoking. Patient presents for scheduled office visit for postop follow-up after AVR, replacement of 5.2 cm ascending aneurysm using circulatory arrest.  Patient had normal LV function but developed RV dysfunction approximately 12 hours after surgery which required return to the OR and placement of a temporary right ventricular assist device with cannulation of the right atrium and the RV outflow tract.  Patient is RV function recovered and the VAD cannulas removed after approximate 1 week and the chest was closed.  Patient had a long recovery including 2 weeks in the inpatient rehab before she transition to home.  She presents today for her first post hospital visit.  The patient looks excellent.  She is sitting up in wheelchair.  She is receiving home physical therapy with ambulation and home nursing.  She still has a tracheostomy in place which will be followed at the tracheostomy clinic.  She is on room air.  The patient's surgical incisions including the cannulation sites have all healed.  Her chest x-ray today is clear.  She still has a small non-cuffed tracheostomy cannula and becomes short of breath at this is occluded.  The patient has maintained sinus rhythm.  Patient apparently has developed a right foot drop since leaving the hospital and had a difficult time getting into the car to be evaluated the office.  There is a slight skin tear of her left shin.  We will contact the home physical therapy for a ankle brace and more physical therapy for her foot drop which apparently is new onset according to the family.  The patient's weight has been stable and her oral  intake is adequate.  She still has anxiety for which she needs multiple psychotropic medications which have been continued from her hospitalization including Xanax, Wellbutrin, at bedtime Seroquel, and BuSpar.  Past Medical History:  Diagnosis Date  . ACL tear    right  Dr. Gladstone Pih   . Adenomatous polyp   . Anxiety   . Aorta aneurysm ([place])   . Arthritis   . Ascending aortic aneurysm ([place])    note per chart per Dr Lucianne Lei Tright 4.7 cm 04/15/2015   . B12 deficiency   . Cancer ([place])   . Cataracts, both eyes 10/2006  . Chronic bronchitis ([place])   . Constipation   . Depression   . DUB (dysfunctional uterine bleeding) 10/96  . ETD (eustachian tube dysfunction)   . Fall   . Fibromyalgia   . GERD (gastroesophageal reflux disease)   . Glaucoma    ([place]) Dr. Arnoldo Morale   . Glaucoma    bilaterally  . Helicobacter pylori (H. pylori)   . Hiatal hernia   . History of bronchitis   . History of frequent urinary tract infections   . History of kidney stones   . History of left shoulder fracture    pt states fell off bed and broke ball in shoulder had rod placed   . Hyperlipidemia   . Hypertension   . Hypothyroidism   . Insomnia   . Leg wound, left    healed   . MVP (mitral valve prolapse)   . Occasional  tremors    left arm   . Osteoarthritis   . Osteopenia   . Osteoporosis   . Other and unspecified hyperlipidemia   . Post-menopausal   . Thyroid cancer ([place])   . Thyroid nodule   . Tremor     Past Surgical History:  Procedure Laterality Date  . AORTIC VALVE REPLACEMENT N/A 07/10/2017   Procedure: AORTIC VALVE REPLACEMENT (AVR);  Surgeon: [name], Collier Salina, MD;  Location: [place];  Service: Open Heart Surgery;  Laterality: N/A;  . APPENDECTOMY    . BUNIONECTOMY  08/1999   right - Dr. Irving Shows   . CHOLECYSTECTOMY  1979  . DILATION AND CURETTAGE OF UTERUS  03/31/95   Dr. Ovid Curd   . EXPLORATION POST OPERATIVE OPEN HEART N/A 07/11/2017   Procedure: EXPLORATION POST OPERATIVE OPEN HEART;   Surgeon: Ivin Poot, MD;  Location: [facility];  Service: Open Heart Surgery;  Laterality: N/A;  . EYE SURGERY     also had left cataract removed   . FRACTURE SURGERY    . HEMATOMA EVACUATION N/A 07/11/2017   Procedure: EVACUATION HEMATOMA;  Surgeon: Ivin Poot, MD;  Location: [facility];  Service: Open Heart Surgery;  Laterality: N/A;  . HERNIA REPAIR    . NEPHROLITHOTOMY Left 04/07/2017   Procedure: NEPHROLITHOTOMY PERCUTANEOUS WITH SURGEON ACCESS;  Surgeon: Ardis Hughs, MD;  [facility];  Service: Urology;  Laterality: Left;  . ORIF HUMERUS FRACTURE  05/30/2011   Procedure: OPEN REDUCTION INTERNAL FIXATION (ORIF) PROXIMAL HUMERUS FRACTURE;  Surgeon: Augustin Schooling;  Location: [facility];  Service: Orthopedics;  Laterality: Left;  open reduction internal fixation of proximal humerus fracture  . PLACEMENT OF CENTRIMAG VENTRICULAR ASSIST DEVICE Right 07/11/2017   Procedure: PLACEMENT OF CENTRIMAG VENTRICULAR ASSIST DEVICE;  Surgeon: Ivin Poot, MD;  Location: [facility];  Service: Open Heart Surgery;  Laterality: Right;  . REMOVAL OF CENTRIMAG VENTRICULAR ASSIST DEVICE N/A 07/17/2017   Procedure: REMOVAL OF RVAD WITH PUMP STANDBY;  Surgeon: Ivin Poot, MD;  Location: [facility];  Service: Open Heart Surgery;  Laterality: N/A;  . REPLACEMENT ASCENDING AORTA N/A 07/10/2017   Procedure: REPLACEMENT ASCENDING AORTA;  Surgeon: Ivin Poot, MD;  Location: [facility];  Service: Open Heart Surgery;  Laterality: N/A;  . right knee replacement  3/11   Dr. Gladstone Pih  . RIGHT/LEFT HEART CATH AND CORONARY ANGIOGRAPHY N/A 06/13/2017   Procedure: RIGHT/LEFT HEART CATH AND CORONARY ANGIOGRAPHY;  Surgeon: Jolaine Artist, MD;  Location: [facility];  Service: Cardiovascular;  Laterality: N/A;  . rt. eye cataract  05/28/07  . TEE WITHOUT CARDIOVERSION N/A 07/10/2017   Procedure: TRANSESOPHAGEAL ECHOCARDIOGRAM (TEE);  Surgeon: [name], Collier Salina, MD;  Location: [facility];  Service: Open Heart Surgery;   Laterality: N/A;  . TEE WITHOUT CARDIOVERSION  07/11/2017   Procedure: TRANSESOPHAGEAL ECHOCARDIOGRAM (TEE);  Surgeon: [name], Collier Salina, MD;  Location: [facility];  Service: Open Heart Surgery;;  . TEE WITHOUT CARDIOVERSION N/A 07/17/2017   Procedure: TRANSESOPHAGEAL ECHOCARDIOGRAM (TEE);  Surgeon: [name], Collier Salina, MD;  Location: [facility];  Service: Open Heart Surgery;  Laterality: N/A;  . THYROID LOBECTOMY Left 07/27/2015   Procedure: LEFT THYROID LOBECTOMY;  Surgeon: Fanny Skates, MD;  [facility];  Service: General;  Laterality: Left;  . THYROIDECTOMY N/A 08/06/2015   Procedure: RIGHT THYROID LOBECTOMY, REIMPLANTATION PARATHYROID;  Surgeon: Fanny Skates, MD;  [facility];  Service: General;  Laterality: N/A;  . VENTRAL HERNIA REPAIR  2/99  Family History  Problem Relation Age of Onset  . Cancer Mother        PANCREATIC   . Osteoporosis Mother   . Hip fracture Mother   . Heart attack Father 63       Died 68  . Heart disease Father   . Arthritis Father   . Hypertension Sister   . Cancer Sister        skin  . Hypertension Sister   . Breast cancer Neg Hx     Social History Social History   Tobacco Use  . Smoking status: Never Smoker  . Smokeless tobacco: Never Used  Substance Use Topics  . Alcohol use: No  . Drug use: No    Current Outpatient Medications  Medication Sig Dispense Refill  . aspirin EC 325 MG EC tablet Take 1 tablet (325 mg total) by mouth daily. 30 tablet 0  . budesonide (PULMICORT) 0.5 MG/2ML nebulizer solution Take 2 mLs (0.5 mg total) by nebulization 2 (two) times daily.  12  . busPIRone (BUSPAR) 5 MG tablet Take 1 tablet (5 mg total) by mouth daily with breakfast. 30 tablet 1  . clonazePAM (KLONOPIN) 0.25 MG disintegrating tablet Take 1 tablet (0.25 mg total) by mouth 2 (two) times daily. 60 tablet 0  . furosemide (LASIX) 40 MG tablet Take 1 tablet (40 mg total) by mouth daily. 30 tablet 0  . guaiFENesin (ROBITUSSIN) 100 MG/5ML SOLN Take 5 mLs (100  mg total) by mouth 3 (three) times daily with meals. 1200 mL 0  . latanoprost (XALATAN) 0.005 % ophthalmic solution Place 1 drop into both eyes at bedtime. 2.5 mL 12  . levothyroxine (SYNTHROID, LEVOTHROID) 100 MCG tablet Take 1 tablet (100 mcg total) by mouth daily before breakfast. 30 tablet 0  . Multiple Vitamin (MULTIVITAMIN WITH MINERALS) TABS tablet Take 1 tablet by mouth daily.    . naphazoline-glycerin (CLEAR EYES REDNESS) 0.012-0.2 % SOLN Place 1-2 drops into both eyes 2 (two) times daily at 10 AM and 5 PM.  0  . potassium chloride (K-DUR) 10 MEQ tablet Take 2 tablets (20 mEq total) by mouth 2 (two) times daily. 60 tablet 0  . QUEtiapine (SEROQUEL) 25 MG tablet Take 3 tablets (75 mg total) by mouth at bedtime. 90 tablet 0  . senna-docusate (SENOKOT-S) 8.6-50 MG tablet Take 2 tablets by mouth 2 (two) times daily.    Marland Kitchen spironolactone (ALDACTONE) 25 MG tablet Take 1 tablet (25 mg total) by mouth daily. 30 tablet 0  . venlafaxine (EFFEXOR) 37.5 MG tablet Take 1 tablet (37.5 mg total) by mouth 2 (two) times daily. 60 tablet 0  . ALPRAZolam (XANAX) 0.25 MG tablet TAKE 1 TABLET THREE TIMES DAILY AS NEEDED FOR ANXIETY 30 tablet 0  . ALPRAZolam (XANAX) 0.25 MG tablet Take 1 tablet (0.25 mg total) by mouth 3 (three) times daily as needed for anxiety. 30 tablet 0  . amiodarone (PACERONE) 200 MG tablet Take 1 tablet (200 mg total) by mouth daily. (Patient not taking: Reported on 09/13/2017) 30 tablet 0  . arformoterol (BROVANA) 15 MCG/2ML NEBU Take 2 mLs (15 mcg total) by nebulization 2 (two) times daily. (Patient not taking: Reported on 09/13/2017) 120 mL    Current Facility-Administered Medications  Medication Dose Route Frequency Provider Last Rate Last Dose  . ALPRAZolam Duanne Moron) tablet 0.25 mg  0.25 mg Oral TID PRN Ivin Poot, MD        Allergies  Allergen Reactions  . Levofloxacin Nausea And Vomiting  .  Lipitor [Atorvastatin Calcium] Other (See Comments)    myalgias  . Lyrica  [Pregabalin] Other (See Comments)    SORES IN MOUTH   . Sibutramine Hcl Monohydrate Other (See Comments)    Reaction unknown  . Actonel [Risedronate] Other (See Comments)    Bone pain and nausea   . Alendronate Sodium Nausea Only  . Latex Rash    RA to latex 1982; includes bandaids    . Meloxicam Other (See Comments)    Vaginal itching     Review of Systems No fever No swelling of her ankles Stable weight   BP 118/79   Pulse 100   Resp 20   Ht 5\' 5"  (1.651 m)   Wt 155 lb (70.3 kg)   SpO2 97% Comment: RA  BMI 25.79 kg/m  Physical Exam      Exam    General- alert and comfortable, sitting in wheelchair able to speak with Passy-Muir valve through trach    Neck- no JVD, no cervical adenopathy palpable, no carotid bruit   Lungs- clear without rales, wheezes.  Sternum well-healed.   Cor- regular rate and rhythm, no murmur , gallop   Abdomen- soft, non-tender   Extremities - warm, non-tender, minimal edema   Neuro- oriented, appropriate, no focal weakness.  Unable to flex the right foot at the ankle   Diagnostic  Chest x-ray clear  Impression: Patient has been home now 9 or 10 days and although her family has had significant involvement With her care she is still having problems with mobility despite home health therapy.  Hopefully with a right foot brace her foot drop problem can be corrected and her rehab continued which was started in the hospital inpatient rehab unit for discharge.  Her cardiac status appears to be fine.  Her pulmonary status is satisfactory but she still requires tracheostomy.  She will be referred to the tracheostomy clinic but it does not appear that she is able to tolerate plugging or downsizing her tracheostomy at this time.  Once her muscle strength and sternotomy healing improves she should be able to be decannulated. Plan: I will see the patient back later this month.  Len Childs, MD [facility] [phone]

## 2017-09-15 ENCOUNTER — Telehealth: Payer: Self-pay

## 2017-09-15 NOTE — Telephone Encounter (Signed)
Patient's husband aware of appointment with Respiratory at [facility] to wean off trach per Dr. [name]. 09/27/2017 @ 11am.  Nurse with [facility] also notified of patient needs for brace for right foot drop.

## 2017-09-18 ENCOUNTER — Encounter: Payer: Self-pay | Admitting: Family Medicine

## 2017-09-18 ENCOUNTER — Ambulatory Visit: Payer: Medicare Other | Admitting: Family Medicine

## 2017-09-18 VITALS — BP 130/74 | HR 107 | Temp 97.1°F | Ht 65.0 in | Wt 155.0 lb

## 2017-09-18 DIAGNOSIS — F418 Other specified anxiety disorders: Secondary | ICD-10-CM | POA: Diagnosis not present

## 2017-09-18 DIAGNOSIS — D692 Other nonthrombocytopenic purpura: Secondary | ICD-10-CM | POA: Diagnosis not present

## 2017-09-18 DIAGNOSIS — I7121 Aneurysm of the ascending aorta, without rupture: Secondary | ICD-10-CM

## 2017-09-18 DIAGNOSIS — Z09 Encounter for follow-up examination after completed treatment for conditions other than malignant neoplasm: Secondary | ICD-10-CM

## 2017-09-18 DIAGNOSIS — C73 Malignant neoplasm of thyroid gland: Secondary | ICD-10-CM

## 2017-09-18 DIAGNOSIS — E538 Deficiency of other specified B group vitamins: Secondary | ICD-10-CM

## 2017-09-18 DIAGNOSIS — I712 Thoracic aortic aneurysm, without rupture: Secondary | ICD-10-CM | POA: Diagnosis not present

## 2017-09-18 MED ORDER — CLONAZEPAM 0.25 MG PO TBDP: 0.2500 mg | ORAL_TABLET | Freq: Three times a day (TID) | ORAL | 3 refills | Status: DC

## 2017-09-18 NOTE — Progress Notes (Signed)
Subjective:    Patient ID: [name], female    DOB: Nov 17, 1940, 77 y.o.   MRN: [id number]  HPI Patient here today for hospital follow up from [facility] where she was admitted on 07/10/17 and she was discharged on 09/02/17.  Patient is doing well considering what she has been through with repair of aneurysm along with aortic valve replacement.  She has had a pretty rough postoperative course having to have a tracheostomy.  She is very weak and currently getting physical therapy.  She is being followed up regularly by the cardiovascular surgeon.  She comes to the visit today with her daughter her husband and her sister.  She does complain of anxiety poor sleep and a smothering sensation.  We will review all of her medicines.  She is currently taking Xanax amiodarone BuSpar clonazepam guaifenesin thyroid medicine Seroquel and Effexor.  She has a lot of duplicate therapy we need to make sure we know exactly what she is taking currently.  She was admitted to the hospital on January 14 and discharged on March 9.  Family mentions that the patient did have a chest x-ray last week and that everything was stable.  She does have a croupy type cough.  They are using cool mist humidification in the house and currently giving her Robitussin.  I would recommend continuing with the current treatment and encouraging her to drink plenty of fluids.  The patient is fairly alert and responds appropriately to questions asked of her. The patient's husband seems to have a good handle on the medicines that she is taking.    Patient Active Problem List   Diagnosis Date Noted  . Acute diastolic congestive heart failure ([place])   . Dysphagia   . Anxiety state   . Debility 08/12/2017  . PAF (paroxysmal atrial fibrillation) ([place])   . Pressure injury of skin 08/04/2017  . Tracheostomy in place [facility])   . Acute encephalopathy   . HCAP (healthcare-associated pneumonia)   . Acute respiratory failure ([place])   . RVF (right ventricular  failure) ([place])   . RVAD (right ventricular assist device) present ([place])   . Cardiogenic shock ([place])   . S/P AVR 07/10/2017  . Nephrolithiasis 04/07/2017  . Vitamin B 12 deficiency 03/01/2016  . Ascending aortic aneurysm ([place]) 08/20/2015  . Thyroid cancer ([place]) 07/30/2015  . Left thyroid nodule 07/27/2015  . Osteoporosis with fracture 04/16/2014  . At high risk for falls 04/16/2014  . Proximal humerus fracture 05/30/2011  . CHEST PAIN, PRECORDIAL 02/26/2010  . Hyperlipidemia 02/23/2010  . Depression 02/23/2010  . GLAUCOMA 02/23/2010  . CATARACTS 02/23/2010  . RHEUMATIC FEVER 02/23/2010  . Essential hypertension 02/23/2010  . MITRAL VALVE PROLAPSE 02/23/2010  . GERD 02/23/2010  . Osteoarthritis 02/23/2010  . Primary fibromyalgia syndrome 02/23/2010   Outpatient Encounter Medications as of 09/18/2017  Medication Sig  . ALPRAZolam (XANAX) 0.25 MG tablet Take 1 tablet (0.25 mg total) by mouth 3 (three) times daily as needed for anxiety.  Marland Kitchen aspirin 81 MG chewable tablet Chew 81 mg by mouth daily.  . busPIRone (BUSPAR) 5 MG tablet Take 1 tablet (5 mg total) by mouth daily with breakfast.  . clonazePAM (KLONOPIN) 0.25 MG disintegrating tablet Take 1 tablet (0.25 mg total) by mouth 2 (two) times daily.  . furosemide (LASIX) 40 MG tablet Take 1 tablet (40 mg total) by mouth daily.  Marland Kitchen guaiFENesin (ROBITUSSIN) 100 MG/5ML SOLN Take 5 mLs (100 mg total) by mouth 3 (three) times daily  with meals.  . latanoprost (XALATAN) 0.005 % ophthalmic solution Place 1 drop into both eyes at bedtime.  Marland Kitchen levothyroxine (SYNTHROID, LEVOTHROID) 100 MCG tablet Take 1 tablet (100 mcg total) by mouth daily before breakfast.  . potassium chloride (K-DUR) 10 MEQ tablet Take 2 tablets (20 mEq total) by mouth 2 (two) times daily.  . QUEtiapine (SEROQUEL) 25 MG tablet Take 3 tablets (75 mg total) by mouth at bedtime.  Marland Kitchen spironolactone (ALDACTONE) 25 MG tablet Take 1 tablet (25 mg total) by mouth daily.  Marland Kitchen venlafaxine  (EFFEXOR) 37.5 MG tablet Take 1 tablet (37.5 mg total) by mouth 2 (two) times daily.  . [DISCONTINUED] ALPRAZolam (XANAX) 0.25 MG tablet TAKE 1 TABLET THREE TIMES DAILY AS NEEDED FOR ANXIETY  . [DISCONTINUED] amiodarone (PACERONE) 200 MG tablet Take 1 tablet (200 mg total) by mouth daily. (Patient not taking: Reported on 09/13/2017)  . [DISCONTINUED] arformoterol (BROVANA) 15 MCG/2ML NEBU Take 2 mLs (15 mcg total) by nebulization 2 (two) times daily. (Patient not taking: Reported on 09/13/2017)  . [DISCONTINUED] aspirin EC 325 MG EC tablet Take 1 tablet (325 mg total) by mouth daily.  . [DISCONTINUED] budesonide (PULMICORT) 0.5 MG/2ML nebulizer solution Take 2 mLs (0.5 mg total) by nebulization 2 (two) times daily.  . [DISCONTINUED] Multiple Vitamin (MULTIVITAMIN WITH MINERALS) TABS tablet Take 1 tablet by mouth daily.  . [DISCONTINUED] naphazoline-glycerin (CLEAR EYES REDNESS) 0.012-0.2 % SOLN Place 1-2 drops into both eyes 2 (two) times daily at 10 AM and 5 PM.  . [DISCONTINUED] senna-docusate (SENOKOT-S) 8.6-50 MG tablet Take 2 tablets by mouth 2 (two) times daily.  . [DISCONTINUED] ALPRAZolam Duanne Moron) tablet 0.25 mg    No facility-administered encounter medications on file as of 09/18/2017.      Review of Systems  Constitutional: Negative.        Not sleeping well   HENT: Negative.   Eyes: Negative.   Respiratory: Negative.  Shortness of breath: "feels like smothering"    Cardiovascular: Negative.   Gastrointestinal: Negative.   Endocrine: Negative.   Genitourinary: Negative.   Musculoskeletal: Negative.   Skin: Negative.   Allergic/Immunologic: Negative.   Neurological: Positive for tremors.  Hematological: Negative.   Psychiatric/Behavioral: The patient is nervous/anxious.        Objective:   Physical Exam  Constitutional: She is oriented to person, place, and time.  The patient is calm but obviously distressed because of her weakened condition and the surgery that she has been  through.  HENT:  Head: Normocephalic and atraumatic.  Right Ear: External ear normal.  Left Ear: External ear normal.  Nose: Nose normal.  Mouth/Throat: Oropharynx is clear and moist. No oropharyngeal exudate.  Eyes: Pupils are equal, round, and reactive to light. Conjunctivae and EOM are normal. Right eye exhibits no discharge. Left eye exhibits no discharge. No scleral icterus.  Neck: Normal range of motion. Neck supple. No thyromegaly present.  Tracheostomy in place  Cardiovascular: Normal rate, regular rhythm and normal heart sounds.  No murmur heard. The rhythm is regular today at 60/min  Pulmonary/Chest: Effort normal and breath sounds normal. No respiratory distress. She has no wheezes. She has no rales.  Diminished breath sounds but no rales or rhonchi chest wall incision is healing well  Abdominal: Soft. Bowel sounds are normal. She exhibits no mass. There is no tenderness.  Musculoskeletal: She exhibits no edema.  Patient in wheelchair  Lymphadenopathy:    She has no cervical adenopathy.  Neurological: She is alert and oriented to person, place,  and time.  Skin: Skin is warm and dry. No rash noted.  Contusions and abrasions with a skin tear of the left lower leg.  Psychiatric: She has a normal mood and affect. Her behavior is normal. Judgment and thought content normal.  Positive attitude considering what she has been through  Nursing note and vitals reviewed.  BP 130/74 (BP Location: Left Arm)   Pulse (!) 107   Temp (!) 97.1 F (36.2 C) (Oral)   Ht _0  (1.651 m)   Wt 155 lb (70.3 kg)   BMI 25.79 kg/m         Assessment & Plan:  1. Hospital discharge follow-up -The patient is not taking her potassium regularly. - CBC with Differential/Platelet - BMP8+EGFR - Thyroid Panel With TSH - Vitamin B12 - Hepatic function panel  2. Ascending aortic aneurysm ([place]) -Status post repair and recovering well and as expected with physical therapy  3. Vitamin B 12  deficiency -Labs today  4. Senile purpura ([facility]) -Bruises on both lower extremities  5. Thyroid cancer ([place]) -Thyroid profile today  6. Anxiety with depression -Discontinue Xanax and increase clonazepam to 3 times daily as she stops the Xanax. -Discontinue BuSpar -Continue with Seroquel  Meds ordered this encounter  Medications  . clonazePAM (KLONOPIN) 0.25 MG disintegrating tablet    Sig: Take 1 tablet (0.25 mg total) by mouth 3 (three) times daily.    Dispense:  90 tablet    Refill:  3   Patient Instructions  Continue with home physical therapy Hold the Xanax completely Increase the clonazepam to 3 times daily and an additional half if necessary and patients husband will call us back on that. Start multivitamin Patient is encouraged to try to eat better and eat to get stronger Follow-up with rehab physician and Dr. Darcey Nora next week as planned Get back in touch with [place] after those visits and we will decide if and when we need to see her again We will check lab work and make sure that everything is stable and if her potassium is low we will try to start some type of potassium either in a smaller pill or in a liquid form if possible depending on what her need is when the lab work is returned Wear the brace as expected on the right foot drop and work with physical therapy as planned Continue to be careful and not put herself at risk for falling Continue to practice good hand hygiene and staying out of crowds of people so as not to get the flu on top of what she is already gone through with. Discontinue the BuSpar We will check with cardiology about the continued need for amiodarone or not.  Arrie Senate MD

## 2017-09-18 NOTE — Patient Instructions (Addendum)
Continue with home physical therapy Hold the Xanax completely Increase the clonazepam to 3 times daily and an additional half if necessary and patients husband will call us back on that. Start multivitamin Patient is encouraged to try to eat better and eat to get stronger Follow-up with rehab physician and Dr. Darcey Nora next week as planned Get back in touch with [place] after those visits and we will decide if and when we need to see her again We will check lab work and make sure that everything is stable and if her potassium is low we will try to start some type of potassium either in a smaller pill or in a liquid form if possible depending on what her need is when the lab work is returned Wear the brace as expected on the right foot drop and work with physical therapy as planned Continue to be careful and not put herself at risk for falling Continue to practice good hand hygiene and staying out of crowds of people so as not to get the flu on top of what she is already gone through with. Discontinue the BuSpar We will check with cardiology about the continued need for amiodarone or not.

## 2017-09-19 ENCOUNTER — Other Ambulatory Visit: Payer: Self-pay | Admitting: *Deleted

## 2017-09-19 ENCOUNTER — Telehealth: Payer: Self-pay

## 2017-09-19 DIAGNOSIS — R5381 Other malaise: Secondary | ICD-10-CM | POA: Diagnosis not present

## 2017-09-19 DIAGNOSIS — R269 Unspecified abnormalities of gait and mobility: Secondary | ICD-10-CM | POA: Diagnosis not present

## 2017-09-19 DIAGNOSIS — Z930 Tracheostomy status: Secondary | ICD-10-CM | POA: Diagnosis not present

## 2017-09-19 LAB — HEPATIC FUNCTION PANEL
ALBUMIN: 4 g/dL (ref 3.5–4.8)
ALT: 21 IU/L (ref 0–32)
AST: 23 IU/L (ref 0–40)
Alkaline Phosphatase: 203 IU/L — ABNORMAL HIGH (ref 39–117)
BILIRUBIN TOTAL: 0.7 mg/dL (ref 0.0–1.2)
Bilirubin, Direct: 0.24 mg/dL (ref 0.00–0.40)
TOTAL PROTEIN: 7.1 g/dL (ref 6.0–8.5)

## 2017-09-19 LAB — BMP8+EGFR
BUN / CREAT RATIO: 12 (ref 12–28)
BUN: 10 mg/dL (ref 8–27)
CO2: 27 mmol/L (ref 20–29)
CREATININE: 0.81 mg/dL (ref 0.57–1.00)
Calcium: 8.2 mg/dL — ABNORMAL LOW (ref 8.7–10.3)
Chloride: 93 mmol/L — ABNORMAL LOW (ref 96–106)
GFR, EST AFRICAN AMERICAN: 82 mL/min/{1.73_m2} (ref >59)
GFR, EST NON AFRICAN AMERICAN: 71 mL/min/{1.73_m2} (ref >59)
Glucose: 101 mg/dL — ABNORMAL HIGH (ref 65–99)
Potassium: 3 mmol/L — ABNORMAL LOW (ref 3.5–5.2)
Sodium: 142 mmol/L (ref 134–144)

## 2017-09-19 LAB — CBC WITH DIFFERENTIAL/PLATELET
BASOS: 0 %
Basophils Absolute: 0 10*3/uL (ref 0.0–0.2)
EOS (ABSOLUTE): 0.1 10*3/uL (ref 0.0–0.4)
Eos: 1 %
HEMATOCRIT: 38.2 % (ref 34.0–46.6)
HEMOGLOBIN: 12.7 g/dL (ref 11.1–15.9)
IMMATURE GRANS (ABS): 0 10*3/uL (ref 0.0–0.1)
Immature Granulocytes: 0 %
LYMPHS: 16 %
Lymphocytes Absolute: 1.5 10*3/uL (ref 0.7–3.1)
MCH: 29.5 pg (ref 26.6–33.0)
MCHC: 33.2 g/dL (ref 31.5–35.7)
MCV: 89 fL (ref 79–97)
Monocytes Absolute: 0.8 10*3/uL (ref 0.1–0.9)
Monocytes: 9 %
NEUTROS ABS: 7 10*3/uL (ref 1.4–7.0)
Neutrophils: 74 %
PLATELETS: 331 10*3/uL (ref 150–379)
RBC: 4.31 x10E6/uL (ref 3.77–5.28)
RDW: 14.2 % (ref 12.3–15.4)
WBC: 9.3 10*3/uL (ref 3.4–10.8)

## 2017-09-19 LAB — THYROID PANEL WITH TSH
FREE THYROXINE INDEX: 6.3 — AB (ref 1.2–4.9)
T3 Uptake Ratio: 38 % (ref 24–39)
T4, Total: 16.6 ug/dL — ABNORMAL HIGH (ref 4.5–12.0)
TSH: 6.66 u[IU]/mL — ABNORMAL HIGH (ref 0.450–4.500)

## 2017-09-19 LAB — VITAMIN B12: VITAMIN B 12: 863 pg/mL (ref 232–1245)

## 2017-09-19 MED ORDER — POTASSIUM CHLORIDE 20 MEQ/15ML (10%) PO SOLN: 10.0000 meq | Freq: Two times a day (BID) | ORAL | 11 refills | Status: DC

## 2017-09-19 NOTE — Telephone Encounter (Signed)
Jessica at [facility] contacted again in regards to foot drop brace.  Message left on phone.

## 2017-09-20 ENCOUNTER — Telehealth: Payer: Self-pay | Admitting: Family Medicine

## 2017-09-20 ENCOUNTER — Telehealth: Payer: Self-pay

## 2017-09-20 NOTE — Telephone Encounter (Signed)
Called verbal given

## 2017-09-20 NOTE — Telephone Encounter (Signed)
Althea with [facility] called and stated that she had terminated the nurse working with Mrs. Coatney.  She stated that she was unsure of what the nurse prior to her arriving had done and that she would be checking to get [name] a foot drop boot and trach supplies as soon as possible.  She stated that she did visit her in the home yesterday 09/20/2017.  She is getting orders in place to have tach care done in home by home health.  Mrs. Keidel also has an appointment with the [facility] at [facility] next week to start weaning her off.  Althea did say she would be in contact with the office to give an update on Mrs. Boze.  Will continue to monitor.

## 2017-09-21 ENCOUNTER — Telehealth: Payer: Self-pay

## 2017-09-21 ENCOUNTER — Telehealth: Payer: Self-pay | Admitting: *Deleted

## 2017-09-21 NOTE — Telephone Encounter (Signed)
Patient's husband and Nurse with [facility] notified of [place] not having right Foot Drop brace.  I advised the patient that a prescription was written to be picked up for brace.  Patient's husband stated that he saw a brace in the pharmacy near their home and would check to see if this was the correct brace, if not he will pick up the prescription.

## 2017-09-21 NOTE — Telephone Encounter (Signed)
[place] [facility] nurse checking on patients trach supplies and foot drop boot TC to [facility]. They have her trach supply orders. The Trach teams number is [phone]. TC to Dr. Ewell Poe office he is ordering the boot, gave his nurse [facility]'s fax number to send this too.  Called [facility] nurse back with the above information

## 2017-09-27 ENCOUNTER — Telehealth: Payer: Self-pay

## 2017-09-27 ENCOUNTER — Ambulatory Visit (INDEPENDENT_AMBULATORY_CARE_PROVIDER_SITE_OTHER): Payer: Self-pay | Admitting: Cardiothoracic Surgery

## 2017-09-27 ENCOUNTER — Other Ambulatory Visit: Payer: Self-pay

## 2017-09-27 ENCOUNTER — Ambulatory Visit (HOSPITAL_COMMUNITY)
Admission: RE | Admit: 2017-09-27 | Discharge: 2017-09-27 | Disposition: A | Discharge status: Home / Self Care | Payer: Medicare Other | Source: Ambulatory Visit | Attending: Physical Medicine & Rehabilitation | Admitting: Physical Medicine & Rehabilitation

## 2017-09-27 ENCOUNTER — Encounter: Payer: Medicare Other | Admitting: Cardiothoracic Surgery

## 2017-09-27 ENCOUNTER — Encounter: Payer: Self-pay | Admitting: Cardiothoracic Surgery

## 2017-09-27 VITALS — BP 120/76 | HR 109 | Resp 18 | Ht 65.0 in | Wt 155.0 lb

## 2017-09-27 DIAGNOSIS — R061 Stridor: Secondary | ICD-10-CM

## 2017-09-27 DIAGNOSIS — Z9889 Other specified postprocedural states: Secondary | ICD-10-CM | POA: Insufficient documentation

## 2017-09-27 DIAGNOSIS — Z930 Tracheostomy status: Secondary | ICD-10-CM | POA: Diagnosis not present

## 2017-09-27 DIAGNOSIS — R5381 Other malaise: Secondary | ICD-10-CM | POA: Diagnosis not present

## 2017-09-27 DIAGNOSIS — R0609 Other forms of dyspnea: Secondary | ICD-10-CM | POA: Diagnosis not present

## 2017-09-27 DIAGNOSIS — I7121 Aneurysm of the ascending aorta, without rupture: Secondary | ICD-10-CM

## 2017-09-27 DIAGNOSIS — J988 Other specified respiratory disorders: Secondary | ICD-10-CM | POA: Insufficient documentation

## 2017-09-27 DIAGNOSIS — M21379 Foot drop, unspecified foot: Secondary | ICD-10-CM | POA: Diagnosis not present

## 2017-09-27 DIAGNOSIS — I712 Thoracic aortic aneurysm, without rupture: Secondary | ICD-10-CM

## 2017-09-27 DIAGNOSIS — Z952 Presence of prosthetic heart valve: Secondary | ICD-10-CM

## 2017-09-27 DIAGNOSIS — Z430 Encounter for attention to tracheostomy: Secondary | ICD-10-CM | POA: Insufficient documentation

## 2017-09-27 NOTE — Progress Notes (Signed)
Tracheostomy Procedure Note  IVELIZ GARAY [id number] 1940/08/25  Pre Procedure Tracheostomy Information  Trach Brand: Shiley Size: 4.0 Style: Uncuffed Secured by: Velcro   Procedure: trach change    Post Procedure Tracheostomy Information  Trach Brand: Shiley Size: 4.0 Style: Uncuffed Secured by: Velcro   Post Procedure Evaluation:  ETCO2 positive color change from yellow to purple : Yes.   Vital signs:blood pressure 129/70, pulse 96, respirations 24 and pulse oximetry 97 % Patients current condition: stable Complications: No apparent complications Trach site exam: clean, dry Wound care done: dry, clean and 4 x 4 gauze Patient did tolerate procedure well.   Education: none  Prescription needs: none    Additional needs: Initially changed to 5.0 bivona to attempt capping of trach to achieve decannulation.  Patient had stridor and increased work of breathing after placing the bivona trach and red cap.  Changed trach to 4.0 shiley uncuffed trach as originally planned, and placed PMV on patient.  Work of breathing improved, and NP was placing a consult to ENT.

## 2017-09-27 NOTE — Telephone Encounter (Signed)
Order for ENT has been placed. Will close this message.

## 2017-09-27 NOTE — Progress Notes (Addendum)
[facility]   Reason for visit: Tracheostomy care   HPI: 77 year old female s/p prolonged hospital stay from 1/14 to 3/9 for AAA repair and AVR. Course complicated by: RV failure for which she required return to OR and RVAD, prolonged VDRF,&  deconditioning requiring trach to liberate from vent. We assisted w/ her care while she was in the intensive care. She was d/c'd to in-pt rehab and since returned to home. She has been receiving home PT and has been followed by by thoracic surgery as well as her PCP. She reports today to trach clinic to establish care.   Note from Dr Lucianne Lei Tright 3/20:  "Her pulmonary status is satisfactory but she still requires tracheostomy.  She will be referred to the tracheostomy clinic but it does not appear that she is able to tolerate plugging or downsizing her tracheostomy at this time.  Once her muscle strength and sternotomy healing improves she should be able to be decannulated."   ROS General: Generalized weakness but improving HEENT: No neck pain, no headache, no sinus pressure, no significant tracheal discharge Pulmonary: Does have shortness of breath particularly at night, still requiring as needed suctioning Cardiac: No chest pain palpitations or dizziness Abdomen: No nausea vomiting diarrhea GU: No issues Extremities: No focal deficits Endocrine: No hot or cold abnormalities, no weight loss or hair loss  Vital signs: Vital signs:blood pressure 129/70, pulse 96, respirations 24 and pulse oximetry 97 %    Exam: General: This is a pleasant 77 year old female resting comfortably in wheelchair she is in no acute distress HEENT: Size #4 Shiley.  Stoma with granulation tissue at the bottom of stoma site, no significant drainage or discharge.  Her voice quality is quite raspy, she has upper airway inspiratory wheeze Pulmonary: Clear to auscultation Cardiac: Regular rate and rhythm Abdomen: Soft nontender Extremities: Warm and  dry brisk cap refill Neuro: Awake oriented no focal deficits  Trach change/procedure #4 tracheostomy removed, this was a Shiley.  Replaced with a #5 Portex given it was smaller in diameter.  Placed occlusive red to assess spontaneous breathing and voice quality.   she continued to have raspy voice quality as well as inspiratory wheezing/stridor with occlusive In place and smaller external diameter tracheostomy in less than 5 minutes time she became more anxious and progressively short of breath because of this we aborted the #5 Portex and once again placed the larger external diameter #4 Shiley trach.   Impression/dx Trach dependent following prolonged critical illness Stridor w/ upper airway obstruction  Deconditioning Foot drop Recent AAA repair and AVR  Discussion I am concerned that Ms. Wahab has either some degree of tracheal stenosis distal to the trach and/or has vocal cord injury.  Do not think it is safe at this time to decannulate her given how rapidly she decline during occlusive Trial.  She needs ENT consultation for evaluation of vocal cords as well as assessment of airway looking for evidence of tracheal stenosis  Plan Continue size #4 Shiley with routine tracheostomy care Discontinue red occlusive capping Trials, use Passy-Muir valve I will refer her to ENT surgery, she needs evaluation of vocal cords as well as evaluation for tracheal stenosis     Visit time: 45  minutes.   Erick Colace ACNP-BC [place]

## 2017-09-27 NOTE — Telephone Encounter (Signed)
-----   Message from Erick Colace, NP sent at 09/27/2017 11:51 AM EDT ----- Regarding: ENT referral  Hey Guys  Can one of you refer this kind lady to ENT. I would like her to see either: Dr Blenda Nicely, Dr Constance Holster or Dr Redmond Baseman in that order.   Reason for consult: 1) Stridor 2) Concern for tracheal stenosis vs vocal cord injury. 3) inability to safely decannulate trach   Thanks [name]

## 2017-09-28 ENCOUNTER — Other Ambulatory Visit: Payer: Self-pay | Admitting: *Deleted

## 2017-09-28 DIAGNOSIS — I5031 Acute diastolic (congestive) heart failure: Secondary | ICD-10-CM

## 2017-09-28 MED ORDER — POTASSIUM CHLORIDE ER 10 MEQ PO TBCR: 20.0000 meq | EXTENDED_RELEASE_TABLET | Freq: Every day | ORAL | 1 refills | Status: DC

## 2017-09-29 ENCOUNTER — Encounter: Payer: Self-pay | Admitting: Cardiothoracic Surgery

## 2017-09-29 ENCOUNTER — Other Ambulatory Visit: Payer: Self-pay | Admitting: Physical Medicine & Rehabilitation

## 2017-09-29 NOTE — Progress Notes (Signed)
PCP is Chipper Herb, MD Referring Provider is Chipper Herb, MD  Chief Complaint  Patient presents with  . Routine Post Op    1 month f/u    HPI: Patient returns for follow-up after undergoing aortic root replacement for aortic insufficiency and a large ascending aneurysm  The patient appears stronger and brighter since her last visit.  She is using a right ankle brace for her foot drop and has been more stable without falls.  She is breathing better and requires minimal suctioning at home through her tracheostomy.  She was monitored at the trach clinic by pulmonary medicine and a trial of downsizing the trach or plugging the trach was unsuccessful with some respiratory distress.  The tracheostomy was placed by critical care.  She is being referred to ENT for evaluation of possible tracheal stenosis and will probably need a CT scan, possible laryngoscopy.  She is breathing comfortably with a trach in place in the office today  Chest x-ray shows clear lung fields without pleural effusion She is in without evidence of heart failure  Surgical incisions are healing well sinus rhythm   Past Medical History:  Diagnosis Date  . ACL tear    right  Dr. Gladstone Pih   . Adenomatous polyp   . Anxiety   . Aorta aneurysm ([place])   . Arthritis   . Ascending aortic aneurysm ([place])    note per chart per Dr Lucianne Lei Tright 4.7 cm 04/15/2015   . B12 deficiency   . Cancer ([place])   . Cataracts, both eyes 10/2006  . Chronic bronchitis ([place])   . Constipation   . Depression   . DUB (dysfunctional uterine bleeding) 10/96  . ETD (eustachian tube dysfunction)   . Fall   . Fibromyalgia   . GERD (gastroesophageal reflux disease)   . Glaucoma    ([place]) Dr. Arnoldo Morale   . Glaucoma    bilaterally  . Helicobacter pylori (H. pylori)   . Hiatal hernia   . History of bronchitis   . History of frequent urinary tract infections   . History of kidney stones   . History of left shoulder fracture    pt states fell off  bed and broke ball in shoulder had rod placed   . Hyperlipidemia   . Hypertension   . Hypothyroidism   . Insomnia   . Leg wound, left    healed   . MVP (mitral valve prolapse)   . Occasional tremors    left arm   . Osteoarthritis   . Osteopenia   . Osteoporosis   . Other and unspecified hyperlipidemia   . Post-menopausal   . Thyroid cancer ([place])   . Thyroid nodule   . Tremor     Past Surgical History:  Procedure Laterality Date  . AORTIC VALVE REPLACEMENT N/A 07/10/2017   Procedure: AORTIC VALVE REPLACEMENT (AVR);  Surgeon: [name], Collier Salina, MD;  Location: [place];  Service: Open Heart Surgery;  Laterality: N/A;  . APPENDECTOMY    . BUNIONECTOMY  08/1999   right - Dr. Irving Shows   . CHOLECYSTECTOMY  1979  . DILATION AND CURETTAGE OF UTERUS  03/31/95   Dr. Ovid Curd   . EXPLORATION POST OPERATIVE OPEN HEART N/A 07/11/2017   Procedure: EXPLORATION POST OPERATIVE OPEN HEART;  Surgeon: Ivin Poot, MD;  Location: [place];  Service: Open Heart Surgery;  Laterality: N/A;  . EYE SURGERY     also had left cataract removed   . FRACTURE SURGERY    .  HEMATOMA EVACUATION N/A 07/11/2017   Procedure: EVACUATION HEMATOMA;  Surgeon: Ivin Poot, MD;  Location: [place];  Service: Open Heart Surgery;  Laterality: N/A;  . HERNIA REPAIR    . NEPHROLITHOTOMY Left 04/07/2017   Procedure: NEPHROLITHOTOMY PERCUTANEOUS WITH SURGEON ACCESS;  Surgeon: Ardis Hughs, MD;  [facility];  Service: Urology;  Laterality: Left;  . ORIF HUMERUS FRACTURE  05/30/2011   Procedure: OPEN REDUCTION INTERNAL FIXATION (ORIF) PROXIMAL HUMERUS FRACTURE;  Surgeon: Augustin Schooling;  Location: [place];  Service: Orthopedics;  Laterality: Left;  open reduction internal fixation of proximal humerus fracture  . PLACEMENT OF CENTRIMAG VENTRICULAR ASSIST DEVICE Right 07/11/2017   Procedure: PLACEMENT OF CENTRIMAG VENTRICULAR ASSIST DEVICE;  Surgeon: Ivin Poot, MD;  Location: [place];  Service: Open Heart Surgery;   Laterality: Right;  . REMOVAL OF CENTRIMAG VENTRICULAR ASSIST DEVICE N/A 07/17/2017   Procedure: REMOVAL OF RVAD WITH PUMP STANDBY;  Surgeon: Ivin Poot, MD;  Location: [place];  Service: Open Heart Surgery;  Laterality: N/A;  . REPLACEMENT ASCENDING AORTA N/A 07/10/2017   Procedure: REPLACEMENT ASCENDING AORTA;  Surgeon: Ivin Poot, MD;  Location: [place];  Service: Open Heart Surgery;  Laterality: N/A;  . right knee replacement  3/11   Dr. Gladstone Pih  . RIGHT/LEFT HEART CATH AND CORONARY ANGIOGRAPHY N/A 06/13/2017   Procedure: RIGHT/LEFT HEART CATH AND CORONARY ANGIOGRAPHY;  Surgeon: Jolaine Artist, MD;  Location: [place] [facility];  Service: Cardiovascular;  Laterality: N/A;  . rt. eye cataract  05/28/07  . TEE WITHOUT CARDIOVERSION N/A 07/10/2017   Procedure: TRANSESOPHAGEAL ECHOCARDIOGRAM (TEE);  Surgeon: [name], Collier Salina, MD;  Location: [place];  Service: Open Heart Surgery;  Laterality: N/A;  . TEE WITHOUT CARDIOVERSION  07/11/2017   Procedure: TRANSESOPHAGEAL ECHOCARDIOGRAM (TEE);  Surgeon: [name], Collier Salina, MD;  Location: [place];  Service: Open Heart Surgery;;  . TEE WITHOUT CARDIOVERSION N/A 07/17/2017   Procedure: TRANSESOPHAGEAL ECHOCARDIOGRAM (TEE);  Surgeon: [name], Collier Salina, MD;  Location: [place];  Service: Open Heart Surgery;  Laterality: N/A;  . THYROID LOBECTOMY Left 07/27/2015   Procedure: LEFT THYROID LOBECTOMY;  Surgeon: Fanny Skates, MD;  [facility];  Service: General;  Laterality: Left;  . THYROIDECTOMY N/A 08/06/2015   Procedure: RIGHT THYROID LOBECTOMY, REIMPLANTATION PARATHYROID;  Surgeon: Fanny Skates, MD;  [facility];  Service: General;  Laterality: N/A;  . VENTRAL HERNIA REPAIR  2/99    Family History  Problem Relation Age of Onset  . Cancer Mother        PANCREATIC   . Osteoporosis Mother   . Hip fracture Mother   . Heart attack Father 63       Died 68  . Heart disease Father   . Arthritis Father   . Hypertension Sister   . Cancer Sister         skin  . Hypertension Sister   . Breast cancer Neg Hx     Social History Social History   Tobacco Use  . Smoking status: Never Smoker  . Smokeless tobacco: Never Used  Substance Use Topics  . Alcohol use: No  . Drug use: No    Current Outpatient Medications  Medication Sig Dispense Refill  . aspirin 81 MG chewable tablet Chew 81 mg by mouth daily.    . clonazePAM (KLONOPIN) 0.25 MG disintegrating tablet Take 1 tablet (0.25 mg total) by mouth 3 (three) times daily. 90 tablet 3  . furosemide (LASIX) 40 MG tablet  Take 1 tablet (40 mg total) by mouth daily. 30 tablet 0  . guaiFENesin (ROBITUSSIN) 100 MG/5ML SOLN Take 5 mLs (100 mg total) by mouth 3 (three) times daily with meals. 1200 mL 0  . latanoprost (XALATAN) 0.005 % ophthalmic solution Place 1 drop into both eyes at bedtime. 2.5 mL 12  . levothyroxine (SYNTHROID, LEVOTHROID) 100 MCG tablet Take 1 tablet (100 mcg total) by mouth daily before breakfast. (Patient taking differently: Take 100 mcg by mouth daily before breakfast. Alt 110 mcq with 112 mcq EVERY OTHER DAY) 30 tablet 0  . QUEtiapine (SEROQUEL) 25 MG tablet Take 3 tablets (75 mg total) by mouth at bedtime. 90 tablet 0  . spironolactone (ALDACTONE) 25 MG tablet Take 1 tablet (25 mg total) by mouth daily. 30 tablet 0  . venlafaxine (EFFEXOR) 37.5 MG tablet Take 1 tablet (37.5 mg total) by mouth 2 (two) times daily. 60 tablet 0  . potassium chloride (K-DUR) 10 MEQ tablet Take 2 tablets (20 mEq total) by mouth daily. 60 tablet 1   No current facility-administered medications for this visit.     Allergies  Allergen Reactions  . Levofloxacin Nausea And Vomiting  . Lipitor [Atorvastatin Calcium] Other (See Comments)    myalgias  . Lyrica [Pregabalin] Other (See Comments)    SORES IN MOUTH   . Sibutramine Hcl Monohydrate Other (See Comments)    Reaction unknown  . Actonel [Risedronate] Other (See Comments)    Bone pain and nausea   . Alendronate Sodium Nausea Only   . Latex Rash    RA to latex 1982; includes bandaids    . Meloxicam Other (See Comments)    Vaginal itching     Review of Systems  Improved appetite Weight stable No fever BP 120/76 (BP Location: Left Arm, Patient Position: Sitting, Cuff Size: Large)   Pulse (!) 109   Resp 18   Ht 5\' 5"  (1.651 m)   Wt 155 lb (70.3 kg)   SpO2 97% Comment: RA/TRACH  BMI 25.79 kg/m  Physical Exam Alert and pleasant Lungs clear Cardiac rhythm stable without murmur Sternal incision well-healed Tracheostomy site clean and intact  Diagnostic Tests: No chest x-ray today  Impression: Recovering from long hospitalization related to aortic replacement complicated by RV failure and receiving right ventricular assist device perfusion for 1 week with open chest.  Plan: Continue home physical therapy Continue current medications Continue tracheostomy with further assessment of airway problem as a cause for inability to further wean trach.  She also may have underlying pulmonary problems and has not recovered yet from sternotomy.  Return in 1 week Len Childs, MD [facility] [phone]

## 2017-10-02 ENCOUNTER — Other Ambulatory Visit: Payer: Self-pay | Admitting: Physical Medicine & Rehabilitation

## 2017-10-02 DIAGNOSIS — R5381 Other malaise: Secondary | ICD-10-CM | POA: Diagnosis not present

## 2017-10-02 DIAGNOSIS — Z930 Tracheostomy status: Secondary | ICD-10-CM | POA: Diagnosis not present

## 2017-10-03 ENCOUNTER — Other Ambulatory Visit: Payer: Self-pay | Admitting: *Deleted

## 2017-10-03 ENCOUNTER — Ambulatory Visit (INDEPENDENT_AMBULATORY_CARE_PROVIDER_SITE_OTHER): Payer: Medicare Other

## 2017-10-03 DIAGNOSIS — I110 Hypertensive heart disease with heart failure: Secondary | ICD-10-CM

## 2017-10-03 DIAGNOSIS — I480 Paroxysmal atrial fibrillation: Secondary | ICD-10-CM | POA: Diagnosis not present

## 2017-10-03 DIAGNOSIS — Z430 Encounter for attention to tracheostomy: Secondary | ICD-10-CM

## 2017-10-03 DIAGNOSIS — R061 Stridor: Secondary | ICD-10-CM | POA: Diagnosis not present

## 2017-10-03 DIAGNOSIS — I5030 Unspecified diastolic (congestive) heart failure: Secondary | ICD-10-CM | POA: Diagnosis not present

## 2017-10-03 DIAGNOSIS — J386 Stenosis of larynx: Secondary | ICD-10-CM | POA: Diagnosis present

## 2017-10-03 DIAGNOSIS — H938X1 Other specified disorders of right ear: Secondary | ICD-10-CM | POA: Diagnosis not present

## 2017-10-03 DIAGNOSIS — R5381 Other malaise: Secondary | ICD-10-CM | POA: Diagnosis not present

## 2017-10-03 DIAGNOSIS — Z930 Tracheostomy status: Secondary | ICD-10-CM | POA: Diagnosis not present

## 2017-10-03 MED ORDER — VENLAFAXINE HCL 37.5 MG PO TABS: 37.5000 mg | ORAL_TABLET | Freq: Two times a day (BID) | ORAL | 2 refills | Status: DC

## 2017-10-03 MED ORDER — QUETIAPINE FUMARATE 25 MG PO TABS: 75.0000 mg | ORAL_TABLET | Freq: Every day | ORAL | 0 refills | Status: DC

## 2017-10-04 ENCOUNTER — Other Ambulatory Visit: Payer: Self-pay | Admitting: Physical Medicine & Rehabilitation

## 2017-10-05 ENCOUNTER — Other Ambulatory Visit: Payer: Self-pay | Admitting: *Deleted

## 2017-10-05 MED ORDER — FUROSEMIDE 40 MG PO TABS: 40.0000 mg | ORAL_TABLET | Freq: Every day | ORAL | 2 refills | Status: DC

## 2017-10-10 ENCOUNTER — Ambulatory Visit: Payer: Medicare Other | Admitting: Physical Medicine & Rehabilitation

## 2017-10-11 ENCOUNTER — Encounter: Payer: Medicare Other | Admitting: Cardiothoracic Surgery

## 2017-10-11 ENCOUNTER — Encounter: Payer: Self-pay | Admitting: Cardiothoracic Surgery

## 2017-10-11 ENCOUNTER — Ambulatory Visit: Payer: Medicare Other | Admitting: Cardiothoracic Surgery

## 2017-10-11 VITALS — BP 108/67 | HR 97 | Resp 18 | Ht 65.0 in | Wt 129.0 lb

## 2017-10-11 DIAGNOSIS — Z952 Presence of prosthetic heart valve: Secondary | ICD-10-CM

## 2017-10-11 DIAGNOSIS — I712 Thoracic aortic aneurysm, without rupture, unspecified: Secondary | ICD-10-CM

## 2017-10-11 DIAGNOSIS — I5031 Acute diastolic (congestive) heart failure: Secondary | ICD-10-CM

## 2017-10-11 DIAGNOSIS — I7121 Aneurysm of the ascending aorta, without rupture: Secondary | ICD-10-CM

## 2017-10-11 DIAGNOSIS — Z9889 Other specified postprocedural states: Secondary | ICD-10-CM

## 2017-10-11 DIAGNOSIS — I351 Nonrheumatic aortic (valve) insufficiency: Secondary | ICD-10-CM

## 2017-10-11 NOTE — Progress Notes (Signed)
PCP is Chipper Herb, MD Referring Provider is Chipper Herb, MD  Chief Complaint  Patient presents with  . Routine Post Op    1 week f/u .has seen Dr. Johnnette Gourd and will see again forr possible stretching of vocal chords...did not go to [facility] apt. R/S'D fo 11/22/17    HPI: Patient returns for review progress.  She is postop AVR for aortic insufficiency and replacement of a large ascending aneurysm with hypothermic circulatory arrest.  She had postop RV dysfunction and required right ventricular assist device support for 1 week with open chest.  She was decannulated and had the chest closed and recovered.  She had prolonged intubation and underwent tracheostomy by CCM.  She still has the tracheostomy in place because of vocal cord weakness of abduction.[name]  She was assessed by Dr. Redmond Baseman with laryngoscopy.  There is no evidence of tracheal stenosis.  She will continue with her #4 tracheostomy stoma.  Since her last visit the patient is stronger.  She is able to walk with physical therapy over 500 feet.  She has right foot drop which is improving with time in a brace for support  No symptoms of CHF or angina.  Weight stable She is on significant diuretics for postop RV dysfunction and we are weaning those off.   Past Medical History:  Diagnosis Date  . ACL tear    right  Dr. Gladstone Pih   . Adenomatous polyp   . Anxiety   . Aorta aneurysm ([place])   . Arthritis   . Ascending aortic aneurysm ([place])    note per chart per Dr Lucianne Lei Tright 4.7 cm 04/15/2015   . B12 deficiency   . Cancer ([place])   . Cataracts, both eyes 10/2006  . Chronic bronchitis ([place])   . Constipation   . Depression   . DUB (dysfunctional uterine bleeding) 10/96  . ETD (eustachian tube dysfunction)   . Fall   . Fibromyalgia   . GERD (gastroesophageal reflux disease)   . Glaucoma    ([place]) Dr. Arnoldo Morale   . Glaucoma    bilaterally  . Helicobacter pylori (H. pylori)   . Hiatal hernia   . History of bronchitis   .  History of frequent urinary tract infections   . History of kidney stones   . History of left shoulder fracture    pt states fell off bed and broke ball in shoulder had rod placed   . Hyperlipidemia   . Hypertension   . Hypothyroidism   . Insomnia   . Leg wound, left    healed   . MVP (mitral valve prolapse)   . Occasional tremors    left arm   . Osteoarthritis   . Osteopenia   . Osteoporosis   . Other and unspecified hyperlipidemia   . Post-menopausal   . Thyroid cancer ([place])   . Thyroid nodule   . Tremor     Past Surgical History:  Procedure Laterality Date  . AORTIC VALVE REPLACEMENT N/A 07/10/2017   Procedure: AORTIC VALVE REPLACEMENT (AVR);  Surgeon: [name], Collier Salina, MD;  Location: [facility];  Service: Open Heart Surgery;  Laterality: N/A;  . APPENDECTOMY    . BUNIONECTOMY  08/1999   right - Dr. Irving Shows   . CHOLECYSTECTOMY  1979  . DILATION AND CURETTAGE OF UTERUS  03/31/95   Dr. Ovid Curd   . EXPLORATION POST OPERATIVE OPEN HEART N/A 07/11/2017   Procedure: EXPLORATION POST OPERATIVE OPEN HEART;  Surgeon: Ivin Poot,  MD;  [facility];  Service: Open Heart Surgery;  Laterality: N/A;  . EYE SURGERY     also had left cataract removed   . FRACTURE SURGERY    . HEMATOMA EVACUATION N/A 07/11/2017   Procedure: EVACUATION HEMATOMA;  Surgeon: Ivin Poot, MD;  Location: [place];  Service: Open Heart Surgery;  Laterality: N/A;  . HERNIA REPAIR    . NEPHROLITHOTOMY Left 04/07/2017   Procedure: NEPHROLITHOTOMY PERCUTANEOUS WITH SURGEON ACCESS;  Surgeon: Ardis Hughs, MD;  [facility];  Service: Urology;  Laterality: Left;  . ORIF HUMERUS FRACTURE  05/30/2011   Procedure: OPEN REDUCTION INTERNAL FIXATION (ORIF) PROXIMAL HUMERUS FRACTURE;  Surgeon: Augustin Schooling;  Location: [place];  Service: Orthopedics;  Laterality: Left;  open reduction internal fixation of proximal humerus fracture  . PLACEMENT OF CENTRIMAG VENTRICULAR ASSIST DEVICE Right 07/11/2017   Procedure:  PLACEMENT OF CENTRIMAG VENTRICULAR ASSIST DEVICE;  Surgeon: Ivin Poot, MD;  Location: [place];  Service: Open Heart Surgery;  Laterality: Right;  . REMOVAL OF CENTRIMAG VENTRICULAR ASSIST DEVICE N/A 07/17/2017   Procedure: REMOVAL OF RVAD WITH PUMP STANDBY;  Surgeon: Ivin Poot, MD;  Location: [place];  Service: Open Heart Surgery;  Laterality: N/A;  . REPLACEMENT ASCENDING AORTA N/A 07/10/2017   Procedure: REPLACEMENT ASCENDING AORTA;  Surgeon: Ivin Poot, MD;  Location: [place];  Service: Open Heart Surgery;  Laterality: N/A;  . right knee replacement  3/11   Dr. Gladstone Pih  . RIGHT/LEFT HEART CATH AND CORONARY ANGIOGRAPHY N/A 06/13/2017   Procedure: RIGHT/LEFT HEART CATH AND CORONARY ANGIOGRAPHY;  Surgeon: Jolaine Artist, MD;  Location: [place] [facility];  Service: Cardiovascular;  Laterality: N/A;  . rt. eye cataract  05/28/07  . TEE WITHOUT CARDIOVERSION N/A 07/10/2017   Procedure: TRANSESOPHAGEAL ECHOCARDIOGRAM (TEE);  Surgeon: [name], Collier Salina, MD;  Location: [place];  Service: Open Heart Surgery;  Laterality: N/A;  . TEE WITHOUT CARDIOVERSION  07/11/2017   Procedure: TRANSESOPHAGEAL ECHOCARDIOGRAM (TEE);  Surgeon: [name], Collier Salina, MD;  Location: [place];  Service: Open Heart Surgery;;  . TEE WITHOUT CARDIOVERSION N/A 07/17/2017   Procedure: TRANSESOPHAGEAL ECHOCARDIOGRAM (TEE);  Surgeon: [name], Collier Salina, MD;  Location: [place];  Service: Open Heart Surgery;  Laterality: N/A;  . THYROID LOBECTOMY Left 07/27/2015   Procedure: LEFT THYROID LOBECTOMY;  Surgeon: Fanny Skates, MD;  [facility];  Service: General;  Laterality: Left;  . THYROIDECTOMY N/A 08/06/2015   Procedure: RIGHT THYROID LOBECTOMY, REIMPLANTATION PARATHYROID;  Surgeon: Fanny Skates, MD;  [facility];  Service: General;  Laterality: N/A;  . VENTRAL HERNIA REPAIR  2/99    Family History  Problem Relation Age of Onset  . Cancer Mother        PANCREATIC   . Osteoporosis Mother   . Hip fracture Mother    . Heart attack Father 63       Died 68  . Heart disease Father   . Arthritis Father   . Hypertension Sister   . Cancer Sister        skin  . Hypertension Sister   . Breast cancer Neg Hx     Social History Social History   Tobacco Use  . Smoking status: Never Smoker  . Smokeless tobacco: Never Used  Substance Use Topics  . Alcohol use: No  . Drug use: No    Current Outpatient Medications  Medication Sig Dispense Refill  . aspirin 81 MG chewable tablet Chew 81 mg  by mouth daily.    . clonazePAM (KLONOPIN) 0.25 MG disintegrating tablet Take 1 tablet (0.25 mg total) by mouth 3 (three) times daily. 90 tablet 3  . furosemide (LASIX) 40 MG tablet Take 20 mg by mouth 3 (three) times a week.    [name] guaiFENesin (ROBITUSSIN) 100 MG/5ML SOLN Take 5 mLs (100 mg total) by mouth 3 (three) times daily with meals. 1200 mL 0  . latanoprost (XALATAN) 0.005 % ophthalmic solution Place 1 drop into both eyes at bedtime. 2.5 mL 12  . levothyroxine (SYNTHROID, LEVOTHROID) 100 MCG tablet Take 1 tablet (100 mcg total) by mouth daily before breakfast. (Patient taking differently: Take 100 mcg by mouth daily before breakfast. Alt 110 mcq with 112 mcq EVERY OTHER DAY) 30 tablet 0  . QUEtiapine (SEROQUEL) 25 MG tablet Take 3 tablets (75 mg total) by mouth at bedtime. 90 tablet 0  . venlafaxine (EFFEXOR) 37.5 MG tablet Take 1 tablet (37.5 mg total) by mouth 2 (two) times daily. 60 tablet 2   No current facility-administered medications for this visit.     Allergies  Allergen Reactions  . Levofloxacin Nausea And Vomiting  . Lipitor [Atorvastatin Calcium] Other (See Comments)    myalgias  . Lyrica [Pregabalin] Other (See Comments)    SORES IN MOUTH   . Sibutramine Hcl Monohydrate Other (See Comments)    Reaction unknown  . Actonel [Risedronate] Other (See Comments)    Bone pain and nausea   . Alendronate Sodium Nausea Only  . Latex Rash    RA to latex 1982; includes bandaids    . Meloxicam Other  (See Comments)    Vaginal itching     Review of Systems   no fever, no falls, no edema Cannot breathe without tracheostomy because of vocal cord weakness of abduction  BP 108/67 (BP Location: Left Arm, Patient Position: Sitting, Cuff Size: Large)   Pulse 97   Resp 18   Ht 5\' 5"  (1.651 m)   Wt 129 lb (58.5 kg)   SpO2 95% Comment: ON RA  BMI 21.47 kg/m  Physical Exam      Exam    General- alert and comfortable.  Tracheostomy in place    neck- no JVD, no cervical adenopathy palpable, no carotid bruit   Lungs- clear without rales, wheezes   Cor- regular rate and rhythm, no murmur , gallop   Abdomen- soft, non-tender   Extremities - warm, non-tender, minimal edema   Neuro- oriented, appropriate, no focal weakness   Diagnostic Tests: None  Impression: Continue with home therapies Patient will follow up with ENT-Dr. Redmond Baseman next month to see if vocal cord strength improves. Continue tracheostomy  Plan: Reduce diuretics   encourage ambulation  Follow-up visit next month  Len Childs, MD [facility] [phone]

## 2017-10-12 ENCOUNTER — Other Ambulatory Visit: Payer: Self-pay | Admitting: Physical Medicine & Rehabilitation

## 2017-10-14 ENCOUNTER — Encounter (HOSPITAL_COMMUNITY): Payer: Self-pay

## 2017-10-14 ENCOUNTER — Other Ambulatory Visit: Payer: Self-pay

## 2017-10-14 ENCOUNTER — Emergency Department (HOSPITAL_COMMUNITY)
Admission: EM | Admit: 2017-10-14 | Discharge: 2017-10-14 | Disposition: A | Discharge status: Home / Self Care | Payer: Medicare Other | Attending: Emergency Medicine | Admitting: Emergency Medicine

## 2017-10-14 DIAGNOSIS — I110 Hypertensive heart disease with heart failure: Secondary | ICD-10-CM | POA: Insufficient documentation

## 2017-10-14 DIAGNOSIS — I5032 Chronic diastolic (congestive) heart failure: Secondary | ICD-10-CM | POA: Insufficient documentation

## 2017-10-14 DIAGNOSIS — J9509 Other tracheostomy complication: Secondary | ICD-10-CM | POA: Diagnosis not present

## 2017-10-14 DIAGNOSIS — Z79899 Other long term (current) drug therapy: Secondary | ICD-10-CM | POA: Insufficient documentation

## 2017-10-14 DIAGNOSIS — Z7982 Long term (current) use of aspirin: Secondary | ICD-10-CM | POA: Insufficient documentation

## 2017-10-14 DIAGNOSIS — I480 Paroxysmal atrial fibrillation: Secondary | ICD-10-CM | POA: Diagnosis not present

## 2017-10-14 DIAGNOSIS — E785 Hyperlipidemia, unspecified: Secondary | ICD-10-CM | POA: Insufficient documentation

## 2017-10-14 DIAGNOSIS — R0600 Dyspnea, unspecified: Secondary | ICD-10-CM | POA: Diagnosis not present

## 2017-10-14 DIAGNOSIS — Z91040 Latex allergy status: Secondary | ICD-10-CM | POA: Insufficient documentation

## 2017-10-14 DIAGNOSIS — E039 Hypothyroidism, unspecified: Secondary | ICD-10-CM | POA: Diagnosis not present

## 2017-10-14 DIAGNOSIS — J9503 Malfunction of tracheostomy stoma: Secondary | ICD-10-CM

## 2017-10-14 NOTE — Discharge Instructions (Addendum)
Let Dr Redmond Baseman office know the tube came out today and was reinserted. Recheck if you get pain, bleeding or have difficulty breathing.

## 2017-10-14 NOTE — ED Triage Notes (Signed)
Pt's tracheostomy disloged tonight approx 0000-0030; arrives via [employer]; pt's trach readjusted by EMS, brought in on blow-by at 15L02,  99% on RA at ED; alert oriented x4; 120 CBG EMS; HR 110-120; 20 R Forearm

## 2017-10-14 NOTE — ED Notes (Signed)
Could not obtain e-sign d/t malfunctioning equipment.  Pt and family verbalized understanding of d/c instructions, follow up and return precautions.

## 2017-10-14 NOTE — Progress Notes (Signed)
RT called to assess pt. And her trache. Pt. Vitals checked out fine with sats of 99%, HR 96, RR 16. Pt. Does not display any distress and the trache is secured. MD is aware.

## 2017-10-14 NOTE — ED Provider Notes (Signed)
[facility] EMERGENCY DEPARTMENT Provider Note   CSN: [id number] Arrival date & time: 10/14/17  0147  Time seen 3:30 AM    History   Chief Complaint Chief Complaint  Patient presents with  . Tracheostomy Tube Change    HPI [name] is a 77 y.o. female.  HPI patient has had a tracheostomy since having surgery for an aortic aneurysm on January 14.  They believe this trach was put in about February 1.  Tonight she got up to go to the bathroom and states "the trach just came out".  She states it came all the way out she caught in her hand.  EMS was called and they replaced it.  She states she currently has no pain or difficulty breathing.  There is minimal amount of blood around the opening.  Patient is talking without difficulty.  PCP Chipper Herb, MD ENT Dr Redmond Baseman  Past Medical History:  Diagnosis Date  . ACL tear    right  Dr. Gladstone Pih   . Adenomatous polyp   . Anxiety   . Aorta aneurysm ([place])   . Arthritis   . Ascending aortic aneurysm ([place])    note per chart per Dr Lucianne Lei Tright 4.7 cm 04/15/2015   . B12 deficiency   . Cancer ([place])   . Cataracts, both eyes 10/2006  . Chronic bronchitis ([place])   . Constipation   . Depression   . DUB (dysfunctional uterine bleeding) 10/96  . ETD (eustachian tube dysfunction)   . Fall   . Fibromyalgia   . GERD (gastroesophageal reflux disease)   . Glaucoma    ([place]) Dr. Arnoldo Morale   . Glaucoma    bilaterally  . Helicobacter pylori (H. pylori)   . Hiatal hernia   . History of bronchitis   . History of frequent urinary tract infections   . History of kidney stones   . History of left shoulder fracture    pt states fell off bed and broke ball in shoulder had rod placed   . Hyperlipidemia   . Hypertension   . Hypothyroidism   . Insomnia   . Leg wound, left    healed   . MVP (mitral valve prolapse)   . Occasional tremors    left arm   . Osteoarthritis   . Osteopenia   . Osteoporosis   . Other and  unspecified hyperlipidemia   . Post-menopausal   . Thyroid cancer ([place])   . Thyroid nodule   . Tremor     Patient Active Problem List   Diagnosis Date Noted  . Airway obstruction   . Dyspnea on exertion   . Stridor   . Acute diastolic congestive heart failure ([place])   . Dysphagia   . Anxiety state   . Debility 08/12/2017  . PAF (paroxysmal atrial fibrillation) ([place])   . Pressure injury of skin 08/04/2017  . Tracheostomy dependence ([place])   . Acute encephalopathy   . HCAP (healthcare-associated pneumonia)   . Acute respiratory failure ([place])   . RVF (right ventricular failure) ([place])   . RVAD (right ventricular assist device) present ([place])   . Cardiogenic shock ([place])   . S/P AVR 07/10/2017  . Nephrolithiasis 04/07/2017  . Vitamin B 12 deficiency 03/01/2016  . Ascending aortic aneurysm ([place]) 08/20/2015  . Thyroid cancer ([place]) 07/30/2015  . Left thyroid nodule 07/27/2015  . Osteoporosis with fracture 04/16/2014  . At high risk for falls 04/16/2014  . Proximal humerus fracture 05/30/2011  .  CHEST PAIN, PRECORDIAL 02/26/2010  . Hyperlipidemia 02/23/2010  . Depression 02/23/2010  . GLAUCOMA 02/23/2010  . CATARACTS 02/23/2010  . RHEUMATIC FEVER 02/23/2010  . Essential hypertension 02/23/2010  . MITRAL VALVE PROLAPSE 02/23/2010  . GERD 02/23/2010  . Osteoarthritis 02/23/2010  . Primary fibromyalgia syndrome 02/23/2010    Past Surgical History:  Procedure Laterality Date  . AORTIC VALVE REPLACEMENT N/A 07/10/2017   Procedure: AORTIC VALVE REPLACEMENT (AVR);  Surgeon: [name], Collier Salina, MD;  Location: [facility];  Service: Open Heart Surgery;  Laterality: N/A;  . APPENDECTOMY    . BUNIONECTOMY  08/1999   right - Dr. Irving Shows   . CHOLECYSTECTOMY  1979  . DILATION AND CURETTAGE OF UTERUS  03/31/95   Dr. Ovid Curd   . EXPLORATION POST OPERATIVE OPEN HEART N/A 07/11/2017   Procedure: EXPLORATION POST OPERATIVE OPEN HEART;  Surgeon: Ivin Poot, MD;  Location: [facility];  Service: Open Heart  Surgery;  Laterality: N/A;  . EYE SURGERY     also had left cataract removed   . FRACTURE SURGERY    . HEMATOMA EVACUATION N/A 07/11/2017   Procedure: EVACUATION HEMATOMA;  Surgeon: Ivin Poot, MD;  Location: [facility];  Service: Open Heart Surgery;  Laterality: N/A;  . HERNIA REPAIR    . NEPHROLITHOTOMY Left 04/07/2017   Procedure: NEPHROLITHOTOMY PERCUTANEOUS WITH SURGEON ACCESS;  Surgeon: Ardis Hughs, MD;  [facility];  Service: Urology;  Laterality: Left;  . ORIF HUMERUS FRACTURE  05/30/2011   Procedure: OPEN REDUCTION INTERNAL FIXATION (ORIF) PROXIMAL HUMERUS FRACTURE;  Surgeon: Augustin Schooling;  Location: [facility];  Service: Orthopedics;  Laterality: Left;  open reduction internal fixation of proximal humerus fracture  . PLACEMENT OF CENTRIMAG VENTRICULAR ASSIST DEVICE Right 07/11/2017   Procedure: PLACEMENT OF CENTRIMAG VENTRICULAR ASSIST DEVICE;  Surgeon: Ivin Poot, MD;  Location: [facility];  Service: Open Heart Surgery;  Laterality: Right;  . REMOVAL OF CENTRIMAG VENTRICULAR ASSIST DEVICE N/A 07/17/2017   Procedure: REMOVAL OF RVAD WITH PUMP STANDBY;  Surgeon: Ivin Poot, MD;  Location: [facility];  Service: Open Heart Surgery;  Laterality: N/A;  . REPLACEMENT ASCENDING AORTA N/A 07/10/2017   Procedure: REPLACEMENT ASCENDING AORTA;  Surgeon: Ivin Poot, MD;  Location: [facility];  Service: Open Heart Surgery;  Laterality: N/A;  . right knee replacement  3/11   Dr. Gladstone Pih  . RIGHT/LEFT HEART CATH AND CORONARY ANGIOGRAPHY N/A 06/13/2017   Procedure: RIGHT/LEFT HEART CATH AND CORONARY ANGIOGRAPHY;  Surgeon: Jolaine Artist, MD;  Location: [facility];  Service: Cardiovascular;  Laterality: N/A;  . rt. eye cataract  05/28/07  . TEE WITHOUT CARDIOVERSION N/A 07/10/2017   Procedure: TRANSESOPHAGEAL ECHOCARDIOGRAM (TEE);  Surgeon: [name], Collier Salina, MD;  Location: [facility];  Service: Open Heart Surgery;  Laterality: N/A;  . TEE WITHOUT CARDIOVERSION  07/11/2017    Procedure: TRANSESOPHAGEAL ECHOCARDIOGRAM (TEE);  Surgeon: [name], Collier Salina, MD;  Location: [facility];  Service: Open Heart Surgery;;  . TEE WITHOUT CARDIOVERSION N/A 07/17/2017   Procedure: TRANSESOPHAGEAL ECHOCARDIOGRAM (TEE);  Surgeon: [name], Collier Salina, MD;  Location: [facility];  Service: Open Heart Surgery;  Laterality: N/A;  . THYROID LOBECTOMY Left 07/27/2015   Procedure: LEFT THYROID LOBECTOMY;  Surgeon: Fanny Skates, MD;  [facility];  Service: General;  Laterality: Left;  . THYROIDECTOMY N/A 08/06/2015   Procedure: RIGHT THYROID LOBECTOMY, REIMPLANTATION PARATHYROID;  Surgeon: Fanny Skates, MD;  [facility];  Service: General;  Laterality: N/A;  . VENTRAL HERNIA  REPAIR  2/99     OB History   None      Home Medications    Prior to Admission medications   Medication Sig Start Date End Date Taking? Authorizing Provider  aspirin 81 MG chewable tablet Chew 81 mg by mouth daily.   Yes [provider]  clonazePAM (KLONOPIN) 0.25 MG disintegrating tablet Take 1 tablet (0.25 mg total) by mouth 3 (three) times daily. Patient taking differently: Take 0.25-0.375 mg by mouth See admin instructions. Take 1 tablet every morning and afternoon then take 1 and 1/2 tablets at bedtime 09/18/17  Yes Chipper Herb, MD  furosemide (LASIX) 40 MG tablet Take 20 mg by mouth 3 (three) times a week. 09/29/17  Yes [provider]  latanoprost (XALATAN) 0.005 % ophthalmic solution Place 1 drop into both eyes at bedtime. 09/01/17  Yes Angiulli, Lavon Paganini, PA-C  levothyroxine (SYNTHROID, LEVOTHROID) 100 MCG tablet Take 1 tablet (100 mcg total) by mouth daily before breakfast. Patient taking differently: Take 100 mcg by mouth every other day.  09/01/17  Yes Angiulli, Lavon Paganini, PA-C  levothyroxine (SYNTHROID, LEVOTHROID) 112 MCG tablet Take 112 mcg by mouth every other day.   Yes [provider]  Multiple Vitamin (MULTIVITAMIN WITH MINERALS) TABS tablet Take 1 tablet by mouth daily.   Yes  [provider]  QUEtiapine (SEROQUEL) 25 MG tablet Take 3 tablets (75 mg total) by mouth at bedtime. 10/03/17  Yes Chipper Herb, MD  venlafaxine (EFFEXOR) 37.5 MG tablet Take 1 tablet (37.5 mg total) by mouth 2 (two) times daily. 10/03/17  Yes Chipper Herb, MD  guaiFENesin (ROBITUSSIN) 100 MG/5ML SOLN Take 5 mLs (100 mg total) by mouth 3 (three) times daily with meals. Patient not taking: Reported on 10/14/2017 09/01/17   Angiulli, Lavon Paganini, PA-C    Family History Family History  Problem Relation Age of Onset  . Cancer Mother        PANCREATIC   . Osteoporosis Mother   . Hip fracture Mother   . Heart attack Father 63       Died 68  . Heart disease Father   . Arthritis Father   . Hypertension Sister   . Cancer Sister        skin  . Hypertension Sister   . Breast cancer Neg Hx     Social History Social History   Tobacco Use  . Smoking status: Never Smoker  . Smokeless tobacco: Never Used  Substance Use Topics  . Alcohol use: No  . Drug use: No  lives at home   Allergies   Levofloxacin; Lipitor [atorvastatin calcium]; Lyrica [pregabalin]; Sibutramine hcl monohydrate; Actonel [risedronate]; Alendronate sodium; Latex; and Meloxicam   Review of Systems Review of Systems  All other systems reviewed and are negative.    Physical Exam Updated Vital Signs BP (!) 131/59   Pulse 95   Temp 98.2 F (36.8 C) (Oral)   Resp 16   Ht 5\' 5"  (1.651 m)   Wt 54.4 kg (120 lb)   SpO2 97%   BMI 19.97 kg/m   Vital signs normal    Physical Exam  Constitutional: She is oriented to person, place, and time. She appears well-developed and well-nourished. No distress.  HENT:  Head: Normocephalic and atraumatic.  Right Ear: External ear normal.  Left Ear: External ear normal.  Nose: Nose normal.  Eyes: Conjunctivae and EOM are normal.  Neck:  The trach is in position, there is a minimal small  amount of dried blood around the opening.  Patient is talking freely.    Cardiovascular: Normal rate.  Pulmonary/Chest: Effort normal. No respiratory distress.  Musculoskeletal: Normal range of motion.  Neurological: She is alert and oriented to person, place, and time. No cranial nerve deficit.  Skin: Skin is warm and dry.  Psychiatric: She has a normal mood and affect. Her behavior is normal. Thought content normal.  Nursing note and vitals reviewed.    ED Treatments / Results  Labs (all labs ordered are listed, but only abnormal results are displayed) Labs Reviewed - No data to display  EKG None  Radiology No results found.  Procedures Procedures (including critical care time)  Medications Ordered in ED Medications - No data to display   Initial Impression / Assessment and Plan / ED Course  I have reviewed the triage vital signs and the nursing notes.  Pertinent labs & imaging results that were available during my care of the patient were reviewed by me and considered in my medical decision making (see chart for details).     Respiratory therapy was asked to assess her trach, they feel the position is good, patient is in no distress.  They do not feel like it needs to be changed out.  Patient was discharged home.  Final Clinical Impressions(s) / ED Diagnoses   Final diagnoses:  Tracheostomy malfunction [facility])    ED Discharge Orders    None     Plan discharge  Rolland Porter, MD, Barbette Or, MD 10/14/17 [id number]

## 2017-10-18 ENCOUNTER — Encounter: Payer: Self-pay | Admitting: Cardiology

## 2017-10-18 DIAGNOSIS — E890 Postprocedural hypothyroidism: Secondary | ICD-10-CM | POA: Diagnosis not present

## 2017-10-21 ENCOUNTER — Other Ambulatory Visit: Payer: Self-pay | Admitting: Family Medicine

## 2017-10-24 ENCOUNTER — Other Ambulatory Visit: Payer: Self-pay | Admitting: Physical Medicine & Rehabilitation

## 2017-10-26 ENCOUNTER — Encounter: Payer: Self-pay | Admitting: Family Medicine

## 2017-10-26 ENCOUNTER — Ambulatory Visit: Payer: Medicare Other | Admitting: Family Medicine

## 2017-10-26 VITALS — BP 125/76 | HR 89 | Temp 97.8°F | Ht 65.0 in | Wt 120.0 lb

## 2017-10-26 DIAGNOSIS — F32A Depression, unspecified: Secondary | ICD-10-CM

## 2017-10-26 DIAGNOSIS — C73 Malignant neoplasm of thyroid gland: Secondary | ICD-10-CM

## 2017-10-26 DIAGNOSIS — E538 Deficiency of other specified B group vitamins: Secondary | ICD-10-CM

## 2017-10-26 DIAGNOSIS — R609 Edema, unspecified: Secondary | ICD-10-CM

## 2017-10-26 DIAGNOSIS — F329 Major depressive disorder, single episode, unspecified: Secondary | ICD-10-CM

## 2017-10-26 DIAGNOSIS — F419 Anxiety disorder, unspecified: Secondary | ICD-10-CM

## 2017-10-26 MED ORDER — CYANOCOBALAMIN 1000 MCG/ML IJ SOLN
1000.0000 ug | INTRAMUSCULAR | Status: AC
  Administered 2017-10-26 – 2018-10-11 (×7): 1000 ug via INTRAMUSCULAR

## 2017-10-26 NOTE — Patient Instructions (Addendum)
Repeat BMP in 2 weeks Increase Lasix and take 20 mg daily until 2 weeks from now. Follow-up with cardiology as planned Follow-up with cardiovascular surgeon as planned Follow-up with ear nose and throat specialist regarding tracheostomy as planned Follow-up with endocrinology as planned Continue with clonazepam twice daily 1 in the morning and 1 at bedtime Add an additional Effexor and take 1 in the morning and 1 at suppertime The patient with the caregivers Avoid caffeine

## 2017-10-26 NOTE — Progress Notes (Signed)
Subjective:    Patient ID: [name], female    DOB: 09/24/1940, 77 y.o.   MRN: [id number]  HPI Patient here today for 4-5 week follow up on anxiety.  Patient has always had some problems with anxiety.  She is currently taking Seroquel 25 mg 3 at bedtime.  She is currently taking clonazepam twice daily.  She is currently taking Effexor once daily.  She is off the BuSpar and the Xanax.  We will get a B12 level today and a thyroid profile today to follow-up on the elevated TSH from previous lab work.  Patient comes to the visit today in a wheelchair with her husband.  She is getting stronger but seems to be more agitated in the evening according to the husband.  She has upcoming appointments with the ear nose and throat specialist the cardiovascular surgeon and the cardiologist.  We continue to fine-tune her medicines for anxiety and depression.  Based on her symptoms we will continue with the Seroquel which was started in the hospital with 25 mg 3 nightly.  We will change the dosing of the clonazepam and have her take 1 in the morning and 1 at bedtime.  We will add an additional Effexor and have her take this in the morning and the second 1 at suppertime.  Because of ongoing edema in her legs we will ask her to take Lasix 40 mg one half daily instead of 3 times weekly.  She will also make efforts to elevate her legs at least for 20 minutes at a time 3 times daily higher than the level of her heart.  Patient today denies any chest pain or shortness of breath.  She is doing better with her tracheostomy device.  She has no trouble with chest pain or shortness of breath or problems with constipation that she has had but this is better.  She is passing her water without problems.    Patient Active Problem List   Diagnosis Date Noted  . Airway obstruction   . Dyspnea on exertion   . Stridor   . Acute diastolic congestive heart failure ([place])   . Dysphagia   . Anxiety state   . Debility 08/12/2017  .  PAF (paroxysmal atrial fibrillation) ([place])   . Pressure injury of skin 08/04/2017  . Tracheostomy dependence ([place])   . Acute encephalopathy   . HCAP (healthcare-associated pneumonia)   . Acute respiratory failure ([place])   . RVF (right ventricular failure) ([place])   . RVAD (right ventricular assist device) present ([place])   . Cardiogenic shock ([place])   . S/P AVR 07/10/2017  . Nephrolithiasis 04/07/2017  . Vitamin B 12 deficiency 03/01/2016  . Ascending aortic aneurysm ([place]) 08/20/2015  . Thyroid cancer ([place]) 07/30/2015  . Left thyroid nodule 07/27/2015  . Osteoporosis with fracture 04/16/2014  . At high risk for falls 04/16/2014  . Proximal humerus fracture 05/30/2011  . CHEST PAIN, PRECORDIAL 02/26/2010  . Hyperlipidemia 02/23/2010  . Depression 02/23/2010  . GLAUCOMA 02/23/2010  . CATARACTS 02/23/2010  . RHEUMATIC FEVER 02/23/2010  . Essential hypertension 02/23/2010  . MITRAL VALVE PROLAPSE 02/23/2010  . GERD 02/23/2010  . Osteoarthritis 02/23/2010  . Primary fibromyalgia syndrome 02/23/2010   Outpatient Encounter Medications as of 10/26/2017  Medication Sig  . aspirin 81 MG chewable tablet Chew 81 mg by mouth daily.  . clonazePAM (KLONOPIN) 0.25 MG disintegrating tablet Take 1 tablet (0.25 mg total) by mouth 3 (three) times daily. (Patient taking differently: Take 0.25-0.375  mg by mouth See admin instructions. Take 1 tablet every morning and afternoon then take 1 and 1/2 tablets at bedtime)  . furosemide (LASIX) 40 MG tablet Take 20 mg by mouth 3 (three) times a week.  Marland Kitchen guaiFENesin (ROBITUSSIN) 100 MG/5ML SOLN Take 5 mLs (100 mg total) by mouth 3 (three) times daily with meals.  . latanoprost (XALATAN) 0.005 % ophthalmic solution Place 1 drop into both eyes at bedtime.  Marland Kitchen levothyroxine (SYNTHROID, LEVOTHROID) 100 MCG tablet Take 1 tablet (100 mcg total) by mouth daily before breakfast. (Patient taking differently: Take 100 mcg by mouth every other day. )  . levothyroxine (SYNTHROID,  LEVOTHROID) 112 MCG tablet Take 112 mcg by mouth every other day.  . Multiple Vitamin (MULTIVITAMIN WITH MINERALS) TABS tablet Take 1 tablet by mouth daily.  Marland Kitchen PROCTOZONE-HC 2.5 % rectal cream APPLY RECTALLY 2 TIMES A DAY AS NEEDED  . QUEtiapine (SEROQUEL) 25 MG tablet Take 3 tablets (75 mg total) by mouth at bedtime.  Marland Kitchen venlafaxine (EFFEXOR) 37.5 MG tablet Take 1 tablet (37.5 mg total) by mouth 2 (two) times daily.   No facility-administered encounter medications on file as of 10/26/2017.       Review of Systems  Constitutional: Negative.   HENT: Negative.   Eyes: Negative.   Respiratory: Negative.   Cardiovascular: Negative.   Gastrointestinal: Negative.   Endocrine: Negative.   Genitourinary: Negative.   Musculoskeletal: Negative.   Skin: Negative.   Allergic/Immunologic: Negative.   Neurological: Negative.   Hematological: Negative.   Psychiatric/Behavioral: The patient is nervous/anxious.        Objective:   Physical Exam  Constitutional: She is oriented to person, place, and time. She appears well-developed and well-nourished.  Patient is pleasant and alert and seems to be calm at this time.  HENT:  Head: Normocephalic and atraumatic.  Right Ear: External ear normal.  Left Ear: External ear normal.  Nose: Nose normal.  Mouth/Throat: Oropharynx is clear and moist.  Eyes: Pupils are equal, round, and reactive to light. Conjunctivae and EOM are normal. Right eye exhibits no discharge. Left eye exhibits no discharge. No scleral icterus.  Neck: Normal range of motion. Neck supple.  Patient is talking with tracheostomy tube in place and no signs of any irritation.  Cardiovascular: Normal rate, regular rhythm and normal heart sounds.  No murmur heard. Pulmonary/Chest: Effort normal and breath sounds normal. No respiratory distress. She has no wheezes. She has no rales.  Abdominal: Soft. Bowel sounds are normal. She exhibits no mass. There is no tenderness. There is no  guarding.  Musculoskeletal: Normal range of motion. She exhibits edema. She exhibits no tenderness.  Patient is in wheelchair during visit today.  3+ pretibial edema with constricting socks  Lymphadenopathy:    She has no cervical adenopathy.  Neurological: She is alert and oriented to person, place, and time. She has normal reflexes. No cranial nerve deficit.  Skin: Skin is warm and dry.  Psychiatric: She has a normal mood and affect. Her behavior is normal. Judgment and thought content normal.  Nursing note and vitals reviewed.  BP 125/76 (BP Location: Left Arm)   Pulse 89   Temp 97.8 F (36.6 C) (Oral)   Ht 5' 5" (1.651 m)   Wt 120 lb (54.4 kg)   BMI 19.97 kg/m         Assessment & Plan:  1. Vitamin B 12 deficiency - Vitamin B12 - cyanocobalamin ((VITAMIN B-12)) injection 1,000 mcg  2. Edema, unspecified type -  Increase Lasix to 20 mg daily for at least 2 weeks and repeat BMP at that time - BMP8+EGFR  3. Thyroid cancer ([place]) -Continue to follow-up with endocrinologist and adjust thyroid medication as directed  4. Anxiety and depression -Continue with clonazepam twice daily one in the morning and 1 at night -Continue with Seroquel at bedtime -Increase Effexor and take 1 in the morning and 1 at supper  Meds ordered this encounter  Medications  . cyanocobalamin ((VITAMIN B-12)) injection 1,000 mcg   Patient Instructions  Repeat BMP in 2 weeks Increase Lasix and take 20 mg daily until 2 weeks from now. Follow-up with cardiology as planned Follow-up with cardiovascular surgeon as planned Follow-up with ear nose and throat specialist regarding tracheostomy as planned Follow-up with endocrinology as planned Continue with clonazepam twice daily 1 in the morning and 1 at bedtime Add an additional Effexor and take 1 in the morning and 1 at suppertime The patient with the caregivers Avoid caffeine  Arrie Senate MD

## 2017-10-27 ENCOUNTER — Telehealth: Payer: Self-pay | Admitting: Family Medicine

## 2017-10-27 LAB — BMP8+EGFR
BUN/Creatinine Ratio: 11 — ABNORMAL LOW (ref 12–28)
BUN: 7 mg/dL — ABNORMAL LOW (ref 8–27)
CO2: 30 mmol/L — AB (ref 20–29)
Calcium: 8.3 mg/dL — ABNORMAL LOW (ref 8.7–10.3)
Chloride: 97 mmol/L (ref 96–106)
Creatinine, Ser: 0.64 mg/dL (ref 0.57–1.00)
GFR calc Af Amer: 101 mL/min/{1.73_m2} (ref >59)
GFR calc non Af Amer: 88 mL/min/{1.73_m2} (ref >59)
GLUCOSE: 98 mg/dL (ref 65–99)
POTASSIUM: 3.1 mmol/L — AB (ref 3.5–5.2)
SODIUM: 143 mmol/L (ref 134–144)

## 2017-10-27 LAB — VITAMIN B12

## 2017-10-27 NOTE — Telephone Encounter (Signed)
Spoke with pt earlier this am

## 2017-10-30 DIAGNOSIS — R061 Stridor: Secondary | ICD-10-CM | POA: Diagnosis not present

## 2017-10-30 DIAGNOSIS — J386 Stenosis of larynx: Secondary | ICD-10-CM | POA: Diagnosis not present

## 2017-10-30 DIAGNOSIS — Z930 Tracheostomy status: Secondary | ICD-10-CM | POA: Diagnosis not present

## 2017-10-31 ENCOUNTER — Telehealth: Payer: Self-pay | Admitting: *Deleted

## 2017-10-31 ENCOUNTER — Encounter: Payer: Self-pay | Admitting: Cardiology

## 2017-10-31 NOTE — Telephone Encounter (Signed)
Spoke with pt Pt has not called office

## 2017-10-31 NOTE — Progress Notes (Addendum)
Cardiology Office Note   Date:  11/01/2017   ID:  [name], DOB Nov 05, 1940, MRN [id number]  PCP:  Chipper Herb, MD  Cardiologist:   No primary care provider on file.   Chief Complaint  Patient presents with  . AVR      History of Present Illness: [name] is a 77 y.o. female who presents for follow up after AVR and aortic surgery.  She was admitted in Jan for elective AVR and ascending root replacement.  Post op she had temponade and VF arrest.  She required an RVAD.      She had a tracheostomy placed and did go to rehab.  She was in the ED in late April after her tracheostomy fell out.  Since then she has done well.  She has seen Dr. Redmond Baseman and does not have tracheal stenosis but is going to be seen again for visualization and if there is scar tissue she might have steroid injections.  For now she is going to continue the trach and this is the biggest thing that worries her.  She is getting around slowly with rehab.  She walks with a walker.  She is weak.  She has some chronic lower extremity swelling.  Is not having any shortness of breath, PND or orthopnea.  She is not having any palpitations, presyncope or syncope.  She denies any chest pressure, neck or arm discomfort.   Past Medical History:  Diagnosis Date  . ACL tear    right  Dr. Gladstone Pih   . Adenomatous polyp   . Anxiety   . Arthritis   . Ascending aortic aneurysm ([place])    note per chart per Dr Lucianne Lei Tright 4.7 cm 04/15/2015   . B12 deficiency   . Cataracts, both eyes 10/2006  . Chronic bronchitis ([place])   . Depression   . DUB (dysfunctional uterine bleeding) 10/96  . Fibromyalgia   . GERD (gastroesophageal reflux disease)   . Glaucoma    bilaterally  . Helicobacter pylori (H. pylori)   . Hiatal hernia   . History of bronchitis   . History of kidney stones   . History of left shoulder fracture    pt states fell off bed and broke ball in shoulder had rod placed   . Hyperlipidemia   . Hypertension    . Hypothyroidism   . Occasional tremors    left arm   . Osteoarthritis   . Osteopenia   . Osteoporosis   . Thyroid cancer ([place])   . Thyroid nodule     Past Surgical History:  Procedure Laterality Date  . AORTIC VALVE REPLACEMENT N/A 07/10/2017   Procedure: AORTIC VALVE REPLACEMENT (AVR);  Surgeon: [name], Collier Salina, MD;  Location: [facility];  Service: Open Heart Surgery;  Laterality: N/A;  . APPENDECTOMY    . BUNIONECTOMY  08/1999   right - Dr. Irving Shows   . CHOLECYSTECTOMY  1979  . DILATION AND CURETTAGE OF UTERUS  03/31/95   Dr. Ovid Curd   . EXPLORATION POST OPERATIVE OPEN HEART N/A 07/11/2017   Procedure: EXPLORATION POST OPERATIVE OPEN HEART;  Surgeon: Ivin Poot, MD;  Location: [facility];  Service: Open Heart Surgery;  Laterality: N/A;  . EYE SURGERY     also had left cataract removed   . FRACTURE SURGERY    . HEMATOMA EVACUATION N/A 07/11/2017   Procedure: EVACUATION HEMATOMA;  Surgeon: Ivin Poot, MD;  Location: [place] [facility];  Service: Open Heart Surgery;  Laterality: N/A;  . HERNIA REPAIR    . NEPHROLITHOTOMY Left 04/07/2017   Procedure: NEPHROLITHOTOMY PERCUTANEOUS WITH SURGEON ACCESS;  Surgeon: Ardis Hughs, MD;  Location: [facility];  Service: Urology;  Laterality: Left;  . ORIF HUMERUS FRACTURE  05/30/2011   Procedure: OPEN REDUCTION INTERNAL FIXATION (ORIF) PROXIMAL HUMERUS FRACTURE;  Surgeon: Augustin Schooling;  Location: [facility];  Service: Orthopedics;  Laterality: Left;  open reduction internal fixation of proximal humerus fracture  . PLACEMENT OF CENTRIMAG VENTRICULAR ASSIST DEVICE Right 07/11/2017   Procedure: PLACEMENT OF CENTRIMAG VENTRICULAR ASSIST DEVICE;  Surgeon: Ivin Poot, MD;  Location: [facility];  Service: Open Heart Surgery;  Laterality: Right;  . REMOVAL OF CENTRIMAG VENTRICULAR ASSIST DEVICE N/A 07/17/2017   Procedure: REMOVAL OF RVAD WITH PUMP STANDBY;  Surgeon: Ivin Poot, MD;  Location: [facility];  Service: Open Heart Surgery;  Laterality: N/A;  . REPLACEMENT  ASCENDING AORTA N/A 07/10/2017   Procedure: REPLACEMENT ASCENDING AORTA;  Surgeon: Ivin Poot, MD;  Location: [facility];  Service: Open Heart Surgery;  Laterality: N/A;  . right knee replacement  3/11   Dr. Gladstone Pih  . RIGHT/LEFT HEART CATH AND CORONARY ANGIOGRAPHY N/A 06/13/2017   Procedure: RIGHT/LEFT HEART CATH AND CORONARY ANGIOGRAPHY;  Surgeon: Jolaine Artist, MD;  Location: [facility];  Service: Cardiovascular;  Laterality: N/A;  . rt. eye cataract  05/28/07  . TEE WITHOUT CARDIOVERSION N/A 07/10/2017   Procedure: TRANSESOPHAGEAL ECHOCARDIOGRAM (TEE);  Surgeon: [name], Collier Salina, MD;  Location: [facility];  Service: Open Heart Surgery;  Laterality: N/A;  . TEE WITHOUT CARDIOVERSION  07/11/2017   Procedure: TRANSESOPHAGEAL ECHOCARDIOGRAM (TEE);  Surgeon: [name], Collier Salina, MD;  Location: [facility];  Service: Open Heart Surgery;;  . TEE WITHOUT CARDIOVERSION N/A 07/17/2017   Procedure: TRANSESOPHAGEAL ECHOCARDIOGRAM (TEE);  Surgeon: [name], Collier Salina, MD;  Location: [facility];  Service: Open Heart Surgery;  Laterality: N/A;  . THYROID LOBECTOMY Left 07/27/2015   Procedure: LEFT THYROID LOBECTOMY;  Surgeon: Fanny Skates, MD;  Location: [facility];  Service: General;  Laterality: Left;  . THYROIDECTOMY N/A 08/06/2015   Procedure: RIGHT THYROID LOBECTOMY, REIMPLANTATION PARATHYROID;  Surgeon: Fanny Skates, MD;  Location[facility];  Service: General;  Laterality: N/A;  . VENTRAL HERNIA REPAIR  2/99     Current Outpatient Medications  Medication Sig Dispense Refill  . aspirin 81 MG chewable tablet Chew 81 mg by mouth daily.    . clonazePAM (KLONOPIN) 0.25 MG disintegrating tablet Take 1 tablet (0.25 mg total) by mouth 3 (three) times daily. (Patient taking differently: Take 0.25-0.375 mg by mouth See admin instructions. Take 1 tablet every morning and afternoon then take 1 and 1/2 tablets at bedtime) 90 tablet 3  . furosemide (LASIX) 20 MG tablet Take 1/2 tablet 10 mg by mouth daily    . guaiFENesin  (ROBITUSSIN) 100 MG/5ML SOLN Take 5 mLs (100 mg total) by mouth 3 (three) times daily with meals. 1200 mL 0  . latanoprost (XALATAN) 0.005 % ophthalmic solution Place 1 drop into both eyes at bedtime. 2.5 mL 12  . levothyroxine (SYNTHROID, LEVOTHROID) 100 MCG tablet 100 mcg. Take one tablet by mouth every other day    . levothyroxine (SYNTHROID, LEVOTHROID) 112 MCG tablet Take 112 mcg by mouth every other day.    . Multiple Vitamin (MULTIVITAMIN WITH MINERALS) TABS tablet Take 1 tablet by mouth daily.    . potassium chloride (K-DUR,KLOR-CON) 10 MEQ tablet Take 10 mEq by mouth 2 (two) times  daily.    Marland Kitchen PROCTOZONE-HC 2.5 % rectal cream APPLY RECTALLY 2 TIMES A DAY AS NEEDED 30 g 0  . QUEtiapine (SEROQUEL) 25 MG tablet Take 3 tablets (75 mg total) by mouth at bedtime. 90 tablet 0  . venlafaxine (EFFEXOR) 37.5 MG tablet Take 1 tablet (37.5 mg total) by mouth 2 (two) times daily. 60 tablet 2   Current Facility-Administered Medications  Medication Dose Route Frequency Provider Last Rate Last Dose  . cyanocobalamin ((VITAMIN B-12)) injection 1,000 mcg  1,000 mcg Intramuscular Q30 days Chipper Herb, MD   1,000 mcg at 10/26/17 1101    Allergies:   Levofloxacin; Lipitor [atorvastatin calcium]; Lyrica [pregabalin]; Actonel [risedronate]; Alendronate sodium; Latex; Meloxicam; and Sibutramine hcl monohydrate    ROS:  Please see the history of present illness.   Otherwise, review of systems are positive for none.   All other systems are reviewed and negative.    PHYSICAL EXAM: VS:  BP 130/80   Pulse (!) 105   Ht 5\' 5"  (1.651 m)   Wt 155 lb (70.3 kg)   BMI 25.79 kg/m  , BMI Body mass index is 25.79 kg/m. GENERAL:  Well appearing HEENT:  Pupils equal round and reactive, fundi not visualized, oral mucosa unremarkable NECK:  No jugular venous distention, waveform within normal limits, carotid upstroke brisk and symmetric, no bruits, no thyromegaly, difficult to visualize jugular veins secondary to  tracheostomy collar, trach appears well-seated and clean LYMPHATICS:  No cervical, inguinal adenopathy LUNGS:  Clear to auscultation bilaterally BACK:  No CVA tenderness CHEST:  Well healed sternotomy scar. HEART:  PMI not displaced or sustained,S1 and S2 within normal limits, no S3, no S4, no clicks, no rubs, 2 out of 6 apical systolic murmur radiating slightly at the aortic outflow tract, no diastolic murmurs ABD:  Flat, positive bowel sounds normal in frequency in pitch, no bruits, no rebound, no guarding, no midline pulsatile mass, no hepatomegaly, no splenomegaly EXT:  2 plus pulses throughout, moderate left greater than right leg edema, no cyanosis no clubbing SKIN:  No rashes no nodules NEURO:  Cranial nerves II through XII grossly intact, motor grossly intact throughout PSYCH:  Cognitively intact, oriented to person place and time   EKG:  EKG is ordered today. Sinus tachycardia, rate 105, right bundle branch block, left anterior fascicular block.  Compared to previous right bundle branch block is complete  Recent Labs: 08/09/2017: Magnesium 1.9 09/18/2017: ALT 21; Hemoglobin 12.7; Platelets 331; TSH 6.660 10/26/2017: BUN 7; Creatinine, Ser 0.64; Potassium 3.1; Sodium 143    Lipid Panel    Component Value Date/Time   CHOL 165 03/15/2017 0920   CHOL 166 10/25/2012 0826   TRIG 530 (H) 07/23/2017 1358   TRIG 92 05/03/2016 1437   TRIG 82 10/25/2012 0826   HDL 57 03/15/2017 0920   HDL 58 05/03/2016 1437   HDL 69 10/25/2012 0826   CHOLHDL 2.9 03/15/2017 0920   LDLCALC 89 03/15/2017 0920   LDLCALC 91 03/14/2014 1139   LDLCALC 81 10/25/2012 0826      Wt Readings from Last 3 Encounters:  11/01/17 155 lb (70.3 kg)  10/26/17 120 lb (54.4 kg)  10/14/17 120 lb (54.4 kg)      Other studies Reviewed: Additional studies/ records that were reviewed today include: Hospital records. Review of the above records demonstrates:  Please see elsewhere in the note.     ASSESSMENT AND  PLAN:  AVR/AORTIC ROOT REPLACEMENT:    She is making slow and steady  recovery from a very complicated hospital course.  She has follow-up later this month with Dr. [name].  I will see her a couple of months after this.    RV FAILURE:  The RV had recovered at the time of follow up echo in January.    HTN:  The blood pressure is at target. No change in medications is indicated. We will continue with therapeutic lifestyle changes (TLC).  EDEMA: She does have some lower extremity edema that she says is chronic.  I would like her to take an extra half a Lasix for the next 3 days and wear her compression stockings.  I told her need to know if this is not going down as I want to make sure she does not have any problems with skin breakdown or weeping.  She is to keep her feet elevated and she is avoiding salt.   Current medicines are reviewed at length with the patient today.  The patient does not have concerns regarding medicines.  The following changes have been made:  no change  Labs/ tests ordered today include: None  Orders Placed This Encounter  Procedures  . EKG 12-Lead     Disposition:   FU with me in 2 months.     Signed, Minus Breeding, MD  11/01/2017 12:58 PM    [facility]

## 2017-11-01 ENCOUNTER — Encounter: Payer: Self-pay | Admitting: Cardiology

## 2017-11-01 ENCOUNTER — Ambulatory Visit: Payer: Medicare Other | Admitting: Cardiology

## 2017-11-01 VITALS — BP 130/80 | HR 105 | Ht 65.0 in | Wt 155.0 lb

## 2017-11-01 DIAGNOSIS — M7989 Other specified soft tissue disorders: Secondary | ICD-10-CM

## 2017-11-01 DIAGNOSIS — I10 Essential (primary) hypertension: Secondary | ICD-10-CM

## 2017-11-01 DIAGNOSIS — I712 Thoracic aortic aneurysm, without rupture, unspecified: Secondary | ICD-10-CM

## 2017-11-01 DIAGNOSIS — Z930 Tracheostomy status: Secondary | ICD-10-CM | POA: Diagnosis not present

## 2017-11-01 DIAGNOSIS — R5381 Other malaise: Secondary | ICD-10-CM | POA: Diagnosis not present

## 2017-11-01 DIAGNOSIS — Z952 Presence of prosthetic heart valve: Secondary | ICD-10-CM

## 2017-11-01 NOTE — Patient Instructions (Addendum)
Medication Instructions:  Please increase your Furosemide to a whole tablet for 3 days then return to your normal dose. Continue all other medications as listed.  Follow-Up: Follow up in 2 months with Dr. Percival Spanish.  You will receive a letter in the mail 2 months before you are due.  Please call us when you receive this letter to schedule your follow up appointment.  Thank you for choosing [facility]!!

## 2017-11-02 ENCOUNTER — Other Ambulatory Visit: Payer: Self-pay | Admitting: Family Medicine

## 2017-11-02 DIAGNOSIS — Z930 Tracheostomy status: Secondary | ICD-10-CM | POA: Diagnosis not present

## 2017-11-02 DIAGNOSIS — R5381 Other malaise: Secondary | ICD-10-CM | POA: Diagnosis not present

## 2017-11-07 DIAGNOSIS — I5030 Unspecified diastolic (congestive) heart failure: Secondary | ICD-10-CM | POA: Diagnosis not present

## 2017-11-07 DIAGNOSIS — R1310 Dysphagia, unspecified: Secondary | ICD-10-CM | POA: Diagnosis not present

## 2017-11-07 DIAGNOSIS — M6281 Muscle weakness (generalized): Secondary | ICD-10-CM | POA: Diagnosis not present

## 2017-11-07 DIAGNOSIS — Z952 Presence of prosthetic heart valve: Secondary | ICD-10-CM | POA: Diagnosis not present

## 2017-11-07 DIAGNOSIS — Z85850 Personal history of malignant neoplasm of thyroid: Secondary | ICD-10-CM | POA: Diagnosis not present

## 2017-11-07 DIAGNOSIS — I110 Hypertensive heart disease with heart failure: Secondary | ICD-10-CM | POA: Diagnosis not present

## 2017-11-07 DIAGNOSIS — Z430 Encounter for attention to tracheostomy: Secondary | ICD-10-CM | POA: Diagnosis not present

## 2017-11-07 DIAGNOSIS — I480 Paroxysmal atrial fibrillation: Secondary | ICD-10-CM | POA: Diagnosis not present

## 2017-11-07 DIAGNOSIS — H409 Unspecified glaucoma: Secondary | ICD-10-CM | POA: Diagnosis not present

## 2017-11-07 DIAGNOSIS — M797 Fibromyalgia: Secondary | ICD-10-CM | POA: Diagnosis not present

## 2017-11-07 DIAGNOSIS — Z7982 Long term (current) use of aspirin: Secondary | ICD-10-CM | POA: Diagnosis not present

## 2017-11-07 DIAGNOSIS — E039 Hypothyroidism, unspecified: Secondary | ICD-10-CM | POA: Diagnosis not present

## 2017-11-07 DIAGNOSIS — M1990 Unspecified osteoarthritis, unspecified site: Secondary | ICD-10-CM | POA: Diagnosis not present

## 2017-11-15 ENCOUNTER — Ambulatory Visit: Payer: Medicare Other | Admitting: Cardiothoracic Surgery

## 2017-11-15 ENCOUNTER — Other Ambulatory Visit: Payer: Medicare Other

## 2017-11-15 DIAGNOSIS — C73 Malignant neoplasm of thyroid gland: Secondary | ICD-10-CM

## 2017-11-15 DIAGNOSIS — E7800 Pure hypercholesterolemia, unspecified: Secondary | ICD-10-CM

## 2017-11-15 DIAGNOSIS — E876 Hypokalemia: Secondary | ICD-10-CM | POA: Diagnosis not present

## 2017-11-15 DIAGNOSIS — I10 Essential (primary) hypertension: Secondary | ICD-10-CM

## 2017-11-15 DIAGNOSIS — E559 Vitamin D deficiency, unspecified: Secondary | ICD-10-CM

## 2017-11-16 LAB — BMP8+EGFR
BUN/Creatinine Ratio: 8 — ABNORMAL LOW (ref 12–28)
BUN: 7 mg/dL — ABNORMAL LOW (ref 8–27)
CO2: 28 mmol/L (ref 20–29)
Calcium: 7.8 mg/dL — ABNORMAL LOW (ref 8.7–10.3)
Chloride: 93 mmol/L — ABNORMAL LOW (ref 96–106)
Creatinine, Ser: 0.83 mg/dL (ref 0.57–1.00)
GFR calc Af Amer: 80 mL/min/{1.73_m2} (ref >59)
GFR calc non Af Amer: 69 mL/min/{1.73_m2} (ref >59)
Glucose: 136 mg/dL — ABNORMAL HIGH (ref 65–99)
Potassium: 2.7 mmol/L — ABNORMAL LOW (ref 3.5–5.2)
Sodium: 142 mmol/L (ref 134–144)

## 2017-11-17 ENCOUNTER — Other Ambulatory Visit: Payer: Self-pay | Admitting: *Deleted

## 2017-11-21 ENCOUNTER — Other Ambulatory Visit: Payer: Self-pay | Admitting: Family Medicine

## 2017-11-22 ENCOUNTER — Telehealth: Payer: Self-pay | Admitting: *Deleted

## 2017-11-22 ENCOUNTER — Ambulatory Visit (HOSPITAL_COMMUNITY)
Admission: RE | Admit: 2017-11-22 | Discharge: 2017-11-22 | Disposition: A | Discharge status: Home / Self Care | Payer: Medicare Other | Source: Ambulatory Visit | Attending: Acute Care | Admitting: Acute Care

## 2017-11-22 DIAGNOSIS — J988 Other specified respiratory disorders: Secondary | ICD-10-CM | POA: Diagnosis not present

## 2017-11-22 DIAGNOSIS — J386 Stenosis of larynx: Secondary | ICD-10-CM | POA: Insufficient documentation

## 2017-11-22 DIAGNOSIS — Z430 Encounter for attention to tracheostomy: Secondary | ICD-10-CM | POA: Insufficient documentation

## 2017-11-22 DIAGNOSIS — Z930 Tracheostomy status: Secondary | ICD-10-CM | POA: Insufficient documentation

## 2017-11-22 MED ORDER — LORATADINE 10 MG PO TABS: 10.0000 mg | ORAL_TABLET | Freq: Every day | ORAL | 11 refills | Status: DC

## 2017-11-22 NOTE — Progress Notes (Signed)
[facility]   Reason for visit:  Routine tracheostomy change  HPI:  77 year old female patient following a prolonged hospital stay after AAA repair and aortic valve repair the course was complicated by right ventricular failure requiring right ventricular assist device and prolonged mechanical ventilation and ultimately tracheostomy after failed extubation attempt.  She was discharged from rehabilitation at the end of March 2019 and I saw her for the first time in tracheostomy clinic on 4/3 at which time the hope was to decannulate.  On exam she was unable to tolerate tracheostomy occlusion and they sent her to ENT given concern about airway obstruction.  She was seen by Dr. Redmond Baseman initially on 4/9 at which time she underwent direct laryngoscopy, his notes indicate the primary cause her obstruction who is the inability of her vocal cords to abduct narrowing the glottal airway.  The plan at that point was to observe over time and he agreed he did not feel it was safe to remove tracheostomy at that point.  She is been seen once again on 5/6 without significant change in exam.  The plan at some point is to consider surgical laryngoscopy and possible steroid injections, she returns to the tracheostomy clinic for planned and routine tracheostomy change.  ROS General: 77 year old female no fever, chills, denies weakness or fatigue actually improved tolerance of activity on all accounts HEENT: No headache, nasal congestion, sore throat, tracheal discharge. Pulmonary: No cough, no wheeze, no shortness of breath with exception of after significant exertion however this is improving as well she does have an occasional cough and has difficulty clearing secretions when mucus is thick Cardiac: Denies chest pain, palpitations. Muscular skeletal: Denies focal weakness Abdomen: Denies weight gain or loss denies nausea vomiting diarrhea. GU no issues Neuro: No issues.  Vital  signs: Heart rate 140/67 pulse rate 87 respirations 15 saturation 100% on room air  Exam: General: 77 year old white female currently resting comfortably in wheelchair she is in no acute distress she looks healthy today. HEENT: Normocephalic atraumatic.  Her voice is raspy.  She has a #4 size tracheostomy in place in her phonation is adequate with Passy-Muir valve in place the tracheostomy stoma is unremarkable Pulmonary: Clear to auscultation no accessory use Cardiac: Regular rate and rhythm Abdomen: Soft nontender Extremities: No significant edema Neuro: Awake alert no focal deficits.  Trach change/procedure #4 tracheostomy was removed.  The tracheostomy stoma suspected.  The site was unremarkable.  A new size 4 cuffless tracheostomy was placed without difficulty.  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 Heckert is not a decannulation candidate.  I have reached out to Dr. Redmond Baseman who is also comanaging her.  Plan is for eventual airway exam in the operating room and possible steroid injection.  At this point from the tracheostomy clinic standpoint we will simply manage routine tracheostomy changes  Plan Return office visit 12 weeks for routine tracheostomy change. Defer decision in regards to decannulation to ENT, if we ultimately decide she is not a candidate for decannulation I will be happy to continue to follow her for routine tracheostomy changes    Visit time: 35 minutes.   Erick Colace ACNP-BC [facility]

## 2017-11-22 NOTE — Telephone Encounter (Signed)
VM from [facility] nurse pt having cough & congestion when going outside.  Wondering about getting Rx for Zyrtec or Claritin Please advise

## 2017-11-22 NOTE — Progress Notes (Signed)
Tracheostomy Procedure Note  [name] [id number] 1940/11/21  Pre Procedure Tracheostomy Information  Trach Brand: Shiley Size: 4.0 Style: Uncuffed Secured by: Velcro   Procedure: trach change    Post Procedure Tracheostomy Information  Trach Brand: Shiley Size: 4.0 Style: Uncuffed Secured by: Velcro   Post Procedure Evaluation:  ETCO2 positive color change from yellow to purple : Yes.   Vital signs:blood pressure 140/67, pulse 87, respirations 15 and pulse oximetry 100 % Patients current condition: stable Complications: No apparent complications Trach site exam: clean, dry Wound care done: dry and 4 x 4 gauze Patient did tolerate procedure well.   Education: none  Prescription needs: none    Additional needs: 12 week follow up

## 2017-11-22 NOTE — Telephone Encounter (Signed)
Pt aware.

## 2017-11-22 NOTE — Telephone Encounter (Signed)
Take Claritin 10 mg 1 daily as needed use nasal saline and take plain Mucinex maximum strength 1 twice daily with a large glass of water as needed for cough and congestion

## 2017-11-23 ENCOUNTER — Ambulatory Visit: Payer: Medicare Other | Admitting: Cardiothoracic Surgery

## 2017-11-23 ENCOUNTER — Encounter: Payer: Self-pay | Admitting: Cardiothoracic Surgery

## 2017-11-23 ENCOUNTER — Other Ambulatory Visit: Payer: Self-pay | Admitting: Otolaryngology

## 2017-11-23 ENCOUNTER — Other Ambulatory Visit: Payer: Self-pay

## 2017-11-23 ENCOUNTER — Other Ambulatory Visit: Payer: Self-pay | Admitting: *Deleted

## 2017-11-23 VITALS — BP 134/70 | HR 87 | Resp 18 | Ht 65.0 in | Wt 153.8 lb

## 2017-11-23 DIAGNOSIS — J3800 Paralysis of vocal cords and larynx, unspecified: Secondary | ICD-10-CM | POA: Diagnosis not present

## 2017-11-23 DIAGNOSIS — Z952 Presence of prosthetic heart valve: Secondary | ICD-10-CM | POA: Diagnosis not present

## 2017-11-23 NOTE — Progress Notes (Signed)
PCP is Chipper Herb, MD Referring Provider is Chipper Herb, MD  Chief Complaint  Patient presents with  . Aortic Stenosis    f/u s/p AVR    HPI: Patient returns for scheduled follow-up after AVR with combined replacement of large ascending thoracic aortic aneurysm over 4 months ago.  She had a long hospitalization because of RV dysfunction and required right ventricular assist device support for a week.  Currently she is in sinus rhythm.  She has no symptoms of angina or heart failure.  Her main issue is with airway and she has a persistent tracheostomy for vocal cord dysfunction.  She is followed by Dr. Johnnette Gourd and a scheduled laryngoscopy at [facility] under anesthesia is scheduled in the next 2 weeks. She is walking with a rolling walker and home physical therapy has completed their course. Past Medical History:  Diagnosis Date  . ACL tear    right  Dr. Gladstone Pih   . Adenomatous polyp   . Anxiety   . Arthritis   . Ascending aortic aneurysm ([place])    note per chart per Dr Lucianne Lei Tright 4.7 cm 04/15/2015   . B12 deficiency   . Cataracts, both eyes 10/2006  . Chronic bronchitis ([place])   . Depression   . DUB (dysfunctional uterine bleeding) 10/96  . Fibromyalgia   . GERD (gastroesophageal reflux disease)   . Glaucoma    bilaterally  . Helicobacter pylori (H. pylori)   . Hiatal hernia   . History of bronchitis   . History of kidney stones   . History of left shoulder fracture    pt states fell off bed and broke ball in shoulder had rod placed   . Hyperlipidemia   . Hypertension   . Hypothyroidism   . Occasional tremors    left arm   . Osteoarthritis   . Osteopenia   . Osteoporosis   . Thyroid cancer ([place])   . Thyroid nodule     Past Surgical History:  Procedure Laterality Date  . AORTIC VALVE REPLACEMENT N/A 07/10/2017   Procedure: AORTIC VALVE REPLACEMENT (AVR);  Surgeon: [name], Collier Salina, MD;  Location: [place];  Service: Open Heart Surgery;  Laterality: N/A;  .  APPENDECTOMY    . BUNIONECTOMY  08/1999   right - Dr. Irving Shows   . CHOLECYSTECTOMY  1979  . DILATION AND CURETTAGE OF UTERUS  03/31/95   Dr. Ovid Curd   . EXPLORATION POST OPERATIVE OPEN HEART N/A 07/11/2017   Procedure: EXPLORATION POST OPERATIVE OPEN HEART;  Surgeon: Ivin Poot, MD;  Location: [place];  Service: Open Heart Surgery;  Laterality: N/A;  . EYE SURGERY     also had left cataract removed   . FRACTURE SURGERY    . HEMATOMA EVACUATION N/A 07/11/2017   Procedure: EVACUATION HEMATOMA;  Surgeon: Ivin Poot, MD;  Location: [place];  Service: Open Heart Surgery;  Laterality: N/A;  . HERNIA REPAIR    . NEPHROLITHOTOMY Left 04/07/2017   Procedure: NEPHROLITHOTOMY PERCUTANEOUS WITH SURGEON ACCESS;  Surgeon: Ardis Hughs, MD;  [facility];  Service: Urology;  Laterality: Left;  . ORIF HUMERUS FRACTURE  05/30/2011   Procedure: OPEN REDUCTION INTERNAL FIXATION (ORIF) PROXIMAL HUMERUS FRACTURE;  Surgeon: Augustin Schooling;  Location: [place];  Service: Orthopedics;  Laterality: Left;  open reduction internal fixation of proximal humerus fracture  . PLACEMENT OF CENTRIMAG VENTRICULAR ASSIST DEVICE Right 07/11/2017   Procedure: PLACEMENT OF CENTRIMAG VENTRICULAR ASSIST DEVICE;  Surgeon: [name],  Collier Salina, MD;  Location: [place];  Service: Open Heart Surgery;  Laterality: Right;  . REMOVAL OF CENTRIMAG VENTRICULAR ASSIST DEVICE N/A 07/17/2017   Procedure: REMOVAL OF RVAD WITH PUMP STANDBY;  Surgeon: Ivin Poot, MD;  Location: [place];  Service: Open Heart Surgery;  Laterality: N/A;  . REPLACEMENT ASCENDING AORTA N/A 07/10/2017   Procedure: REPLACEMENT ASCENDING AORTA;  Surgeon: Ivin Poot, MD;  Location: [place];  Service: Open Heart Surgery;  Laterality: N/A;  . right knee replacement  3/11   Dr. Gladstone Pih  . RIGHT/LEFT HEART CATH AND CORONARY ANGIOGRAPHY N/A 06/13/2017   Procedure: RIGHT/LEFT HEART CATH AND CORONARY ANGIOGRAPHY;  Surgeon: Jolaine Artist, MD;  Location: [place] [facility];  Service: Cardiovascular;  Laterality: N/A;  . rt. eye cataract  05/28/07  . TEE WITHOUT CARDIOVERSION N/A 07/10/2017   Procedure: TRANSESOPHAGEAL ECHOCARDIOGRAM (TEE);  Surgeon: [name], Collier Salina, MD;  Location: [place];  Service: Open Heart Surgery;  Laterality: N/A;  . TEE WITHOUT CARDIOVERSION  07/11/2017   Procedure: TRANSESOPHAGEAL ECHOCARDIOGRAM (TEE);  Surgeon: [name], Collier Salina, MD;  Location: [facility];  Service: Open Heart Surgery;;  . TEE WITHOUT CARDIOVERSION N/A 07/17/2017   Procedure: TRANSESOPHAGEAL ECHOCARDIOGRAM (TEE);  Surgeon: [name], Collier Salina, MD;  Location: [place];  Service: Open Heart Surgery;  Laterality: N/A;  . THYROID LOBECTOMY Left 07/27/2015   Procedure: LEFT THYROID LOBECTOMY;  Surgeon: Fanny Skates, MD;  [facility];  Service: General;  Laterality: Left;  . THYROIDECTOMY N/A 08/06/2015   Procedure: RIGHT THYROID LOBECTOMY, REIMPLANTATION PARATHYROID;  Surgeon: Fanny Skates, MD;  [facility];  Service: General;  Laterality: N/A;  . VENTRAL HERNIA REPAIR  2/99    Family History  Problem Relation Age of Onset  . Cancer Mother        PANCREATIC   . Osteoporosis Mother   . Hip fracture Mother   . Heart attack Father 63       Died 68  . Heart disease Father   . Arthritis Father   . Hypertension Sister   . Cancer Sister        skin  . Hypertension Sister   . Breast cancer Neg Hx     Social History Social History   Tobacco Use  . Smoking status: Never Smoker  . Smokeless tobacco: Never Used  Substance Use Topics  . Alcohol use: No  . Drug use: No    Current Outpatient Medications  Medication Sig Dispense Refill  . aspirin 81 MG chewable tablet Chew 81 mg by mouth daily.    . clonazePAM (KLONOPIN) 0.25 MG disintegrating tablet Take 1 tablet (0.25 mg total) by mouth 3 (three) times daily. (Patient taking differently: Take 0.25-0.375 mg by mouth See admin instructions. Take 1 tablet every morning and afternoon then take 1 and 1/2  tablets at bedtime) 90 tablet 3  . furosemide (LASIX) 20 MG tablet 20 mg. Take 1/2 tablet 10 mg by mouth daily     . guaiFENesin (ROBITUSSIN) 100 MG/5ML SOLN Take 5 mLs (100 mg total) by mouth 3 (three) times daily with meals. 1200 mL 0  . latanoprost (XALATAN) 0.005 % ophthalmic solution Place 1 drop into both eyes at bedtime. 2.5 mL 12  . levothyroxine (SYNTHROID, LEVOTHROID) 100 MCG tablet 100 mcg. Take one tablet by mouth every other day    . levothyroxine (SYNTHROID, LEVOTHROID) 112 MCG tablet Take 112 mcg by mouth every other day.    . loratadine (CLARITIN) 10 MG tablet  Take 1 tablet (10 mg total) by mouth daily. 30 tablet 11  . Multiple Vitamin (MULTIVITAMIN WITH MINERALS) TABS tablet Take 1 tablet by mouth daily.    . potassium chloride (K-DUR,KLOR-CON) 10 MEQ tablet Take 10 mEq by mouth 2 (two) times daily.    Marland Kitchen PROCTOZONE-HC 2.5 % rectal cream APPLY RECTALLY 2 TIMES A DAY AS NEEDED 30 g 0  . PROCTOZONE-HC 2.5 % rectal cream APPLY RECTALLY 2 TIMES A DAY AS NEEDED 30 g 2  . QUEtiapine (SEROQUEL) 25 MG tablet TAKE 3 TABLETS AT BEDTIME AS DIRECTED 90 tablet 0  . venlafaxine (EFFEXOR) 37.5 MG tablet Take 1 tablet (37.5 mg total) by mouth 2 (two) times daily. 60 tablet 2   Current Facility-Administered Medications  Medication Dose Route Frequency Provider Last Rate Last Dose  . cyanocobalamin ((VITAMIN B-12)) injection 1,000 mcg  1,000 mcg Intramuscular Q30 days Chipper Herb, MD   1,000 mcg at 10/26/17 1101    Allergies  Allergen Reactions  . Levofloxacin Nausea And Vomiting  . Lipitor [Atorvastatin Calcium] Other (See Comments)    myalgias  . Lyrica [Pregabalin] Other (See Comments)    SORES IN MOUTH   . Actonel [Risedronate] Other (See Comments)    Bone pain and nausea   . Alendronate Sodium Nausea Only  . Latex Rash    RA to latex 1982; includes bandaids    . Meloxicam Other (See Comments)    Vaginal itching   . Sibutramine Hcl Monohydrate Other (See Comments)     Reaction unknown    Review of Systems  Weight stable at 156 Lower extremity edema has improved with adjustment of Lasix dosing by her cardiologist Dr. Percival Spanish She is getting stronger and is able to work in her garden and walk outside.  No more falls.  BP 134/70 (BP Location: Left Arm, Patient Position: Sitting, Cuff Size: Normal)   Pulse 87   Resp 18   Ht 5\' 5"  (1.651 m)   Wt 153 lb 12.8 oz (69.8 kg)   SpO2 97% Comment: RA  BMI 25.59 kg/m  Physical Exam      Exam    General- alert and comfortable    Neck- no JVD, no cervical adenopathy palpable, no carotid bruit   Lungs- clear without rales, wheezes.  Tracheostomy in place.  Good phonation through p.m. valve   Cor- regular rate and rhythm, no murmur , gallop   Abdomen- soft, non-tender   Extremities - warm, non-tender, minimal edema   Neuro- oriented, appropriate, no focal weakness  Diagnostic Tests: None  Impression: Continued good recovery after long hospitalization for AVR- aortic aneurysm repair which required postop mechanical support with right ventricular assist device.  Plan: Continue current medications. We will review the  report from Dr. Redmond Baseman to evaluate the vocal cord function and appreciate the care provided Ms. Daisey at the trach clinic Will set up for 39-month postop echocardiogram in [place] before she is seen by Dr. Percival Spanish later this summer.  I will see her back in August.   Len Childs, MD [facility] [phone]

## 2017-11-27 NOTE — Pre-Procedure Instructions (Signed)
[name]  11/27/2017      [address], [address] [name] [address] Phone: [phone] Fax: [phone]    Your procedure is scheduled on June 7  Report to [facility] at [place].M.  Call this number if you have problems the morning of surgery:  [phone]   Remember:  NOTHING TO EAT OR DRINK AFTER MIDNIGHT    Take these medicines the morning of surgery with A SIP OF WATER  clonazePAM (KLONOPIN)  EYE DROPS IF NEEDED levothyroxine (SYNTHROID, LEVOTHROID) loratadine (CLARITIN) venlafaxine (EFFEXOR)  7 days prior to surgery STOP taking any Aspirin(unless otherwise instructed by your surgeon), Aleve, Naproxen, Ibuprofen, Motrin, Advil, Goody's, BC's, all herbal medications, fish oil, and all vitamins  Follow your doctors instructions regarding your Aspirin.  If no instructions were given by your doctor, then you will need to call the prescribing office office to get instructions.      Do not wear jewelry, make-up or nail polish.  Do not wear lotions, powders, or perfumes, or deodorant.  Do not shave 48 hours prior to surgery.    Do not bring valuables to the hospital.  [facility] is not responsible for any belongings or valuables.  Contacts, dentures or bridgework may not be worn into surgery.  Leave your suitcase in the car.  After surgery it may be brought to your room.  For patients admitted to the hospital, discharge time will be determined by your treatment team.  Patients discharged the day of surgery will not be allowed to drive home.    Special instructions:   [facility]- Preparing For Surgery  Before surgery, you can play an important role. Because skin is not sterile, your skin needs to be as free of germs as possible. You can reduce the number of germs on your skin by washing with CHG (chlorahexidine gluconate) Soap before surgery.  CHG is an antiseptic cleaner which kills  germs and bonds with the skin to continue killing germs even after washing.    Oral Hygiene is also important to reduce your risk of infection.  Remember - BRUSH YOUR TEETH THE MORNING OF SURGERY WITH YOUR REGULAR TOOTHPASTE  Please do not use if you have an allergy to CHG or antibacterial soaps. If your skin becomes reddened/irritated stop using the CHG.  Do not shave (including legs and underarms) for at least 48 hours prior to first CHG shower. It is OK to shave your face.  Please follow these instructions carefully.   1. Shower the NIGHT BEFORE SURGERY and the MORNING OF SURGERY with CHG.   2. If you chose to wash your hair, wash your hair first as usual with your normal shampoo.  3. After you shampoo, rinse your hair and body thoroughly to remove the shampoo.  4. Use CHG as you would any other liquid soap. You can apply CHG directly to the skin and wash gently with a scrungie or a clean washcloth.   5. Apply the CHG Soap to your body ONLY FROM THE NECK DOWN.  Do not use on open wounds or open sores. Avoid contact with your eyes, ears, mouth and genitals (private parts). Wash Face and genitals (private parts)  with your normal soap.  6. Wash thoroughly, paying special attention to the area where your surgery will be performed.  7. Thoroughly rinse your body with warm water from the neck down.  8. DO NOT  shower/wash with your normal soap after using and rinsing off the CHG Soap.  9. Pat yourself dry with a CLEAN TOWEL.  10. Wear CLEAN PAJAMAS to bed the night before surgery, wear comfortable clothes the morning of surgery  11. Place CLEAN SHEETS on your bed the night of your first shower and DO NOT SLEEP WITH PETS.    Day of Surgery:  Do not apply any deodorants/lotions.  Please wear clean clothes to the hospital/surgery center.   Remember to brush your teeth WITH YOUR REGULAR TOOTHPASTE.    Please read over the following fact sheets that you were given.

## 2017-11-28 ENCOUNTER — Encounter: Payer: Self-pay | Admitting: Family Medicine

## 2017-11-28 ENCOUNTER — Encounter (HOSPITAL_COMMUNITY)
Admission: RE | Admit: 2017-11-28 | Discharge: 2017-11-28 | Disposition: A | Discharge status: Home / Self Care | Payer: Medicare Other | Source: Ambulatory Visit | Attending: Otolaryngology | Admitting: Otolaryngology

## 2017-11-28 ENCOUNTER — Other Ambulatory Visit: Payer: Self-pay

## 2017-11-28 ENCOUNTER — Encounter (HOSPITAL_COMMUNITY): Payer: Self-pay

## 2017-11-28 ENCOUNTER — Ambulatory Visit: Payer: Medicare Other | Admitting: Family Medicine

## 2017-11-28 VITALS — BP 124/80 | HR 88 | Temp 98.0°F | Ht 65.0 in | Wt 153.6 lb

## 2017-11-28 DIAGNOSIS — E785 Hyperlipidemia, unspecified: Secondary | ICD-10-CM | POA: Diagnosis not present

## 2017-11-28 DIAGNOSIS — J386 Stenosis of larynx: Secondary | ICD-10-CM | POA: Diagnosis not present

## 2017-11-28 DIAGNOSIS — Z79899 Other long term (current) drug therapy: Secondary | ICD-10-CM | POA: Diagnosis not present

## 2017-11-28 DIAGNOSIS — C73 Malignant neoplasm of thyroid gland: Secondary | ICD-10-CM | POA: Diagnosis not present

## 2017-11-28 DIAGNOSIS — M797 Fibromyalgia: Secondary | ICD-10-CM | POA: Diagnosis not present

## 2017-11-28 DIAGNOSIS — I7121 Aneurysm of the ascending aorta, without rupture: Secondary | ICD-10-CM

## 2017-11-28 DIAGNOSIS — I10 Essential (primary) hypertension: Secondary | ICD-10-CM

## 2017-11-28 DIAGNOSIS — F419 Anxiety disorder, unspecified: Secondary | ICD-10-CM | POA: Diagnosis not present

## 2017-11-28 DIAGNOSIS — Z9889 Other specified postprocedural states: Secondary | ICD-10-CM

## 2017-11-28 DIAGNOSIS — I712 Thoracic aortic aneurysm, without rupture: Secondary | ICD-10-CM

## 2017-11-28 DIAGNOSIS — D692 Other nonthrombocytopenic purpura: Secondary | ICD-10-CM

## 2017-11-28 DIAGNOSIS — Z930 Tracheostomy status: Secondary | ICD-10-CM | POA: Diagnosis not present

## 2017-11-28 DIAGNOSIS — F32A Depression, unspecified: Secondary | ICD-10-CM

## 2017-11-28 DIAGNOSIS — K219 Gastro-esophageal reflux disease without esophagitis: Secondary | ICD-10-CM | POA: Diagnosis not present

## 2017-11-28 DIAGNOSIS — Z7982 Long term (current) use of aspirin: Secondary | ICD-10-CM | POA: Diagnosis not present

## 2017-11-28 DIAGNOSIS — E538 Deficiency of other specified B group vitamins: Secondary | ICD-10-CM

## 2017-11-28 DIAGNOSIS — E039 Hypothyroidism, unspecified: Secondary | ICD-10-CM | POA: Diagnosis not present

## 2017-11-28 DIAGNOSIS — F329 Major depressive disorder, single episode, unspecified: Secondary | ICD-10-CM | POA: Diagnosis not present

## 2017-11-28 DIAGNOSIS — R609 Edema, unspecified: Secondary | ICD-10-CM

## 2017-11-28 HISTORY — DX: Tracheostomy status: Z93.0

## 2017-11-28 HISTORY — DX: Stenosis of larynx: J38.6

## 2017-11-28 LAB — CBC
HEMATOCRIT: 39.2 % (ref 36.0–46.0)
HEMOGLOBIN: 12.5 g/dL (ref 12.0–15.0)
MCH: 27.5 pg (ref 26.0–34.0)
MCHC: 31.9 g/dL (ref 30.0–36.0)
MCV: 86.3 fL (ref 78.0–100.0)
Platelets: 301 10*3/uL (ref 150–400)
RBC: 4.54 MIL/uL (ref 3.87–5.11)
RDW: 14.4 % (ref 11.5–15.5)
WBC: 5 10*3/uL (ref 4.0–10.5)

## 2017-11-28 LAB — BASIC METABOLIC PANEL
ANION GAP: 12 (ref 5–15)
BUN: 5 mg/dL — ABNORMAL LOW (ref 6–20)
CO2: 29 mmol/L (ref 22–32)
Calcium: 8.5 mg/dL — ABNORMAL LOW (ref 8.9–10.3)
Chloride: 102 mmol/L (ref 101–111)
Creatinine, Ser: 0.8 mg/dL (ref 0.44–1.00)
GFR calc Af Amer: >60 mL/min (ref >60)
GFR calc non Af Amer: >60 mL/min (ref >60)
GLUCOSE: 93 mg/dL (ref 65–99)
POTASSIUM: 3.3 mmol/L — AB (ref 3.5–5.1)
Sodium: 143 mmol/L (ref 135–145)

## 2017-11-28 NOTE — Addendum Note (Signed)
Addended by: Zannie Cove on: 11/28/2017 03:38 PM   Modules accepted: Orders

## 2017-11-28 NOTE — Patient Instructions (Addendum)
Follow-up with cardiology as planned Follow-up with vascular surgery as planned Follow-up with the ENT as planned especially for procedure this coming Friday to work on beginning to remove the tracheostomy. Continue to drink plenty of fluids and stay well-hydrated Continue to be careful not put yourself at risk for falling Continue with current medications for anxiety and depression which include Effexor clonazepam Seroquel and one half of the transiting at bedtime

## 2017-11-28 NOTE — Progress Notes (Signed)
Pt denies any acute cardiopulmonary issues. Pt under the care of Dr. Percival Spanish, Cardiology. Pt was instructed to bring medications to PAT appointment; pt arrived with med list from previous [facility] print out. Verified medications with pt spouse, Laverna Peace once they arrived home. Lisa, Surgical Coordinator, to follow up with pt regarding pre-op Aspirin instructions. Pt denies recent labs. Pt chart forwarded to anesthesia for review; Pt has a tracheostomy tube present, recent aortic valve replacement.

## 2017-11-28 NOTE — Progress Notes (Signed)
Subjective:    Patient ID: [name], female    DOB: June 08, 1941, 77 y.o.   MRN: [id number]  HPI Patient is here today for a 4 week recheck on her anxiety and potassium. Patient has no other complaints today.  The patient is doing well overall considering all she has been through.  She is due to have possibly a Botox injection for laryngeal stenosis by the ear nose and throat specialist on June 7 hoping to lead to eventually getting rid of the tracheostomy.  She has been through quite a bit the past several months with thyroid cancer thoracic aortic aneurysm and aortic valve repair.  She is being followed by Dr. Nils Pyle, Dr. Percival Spanish and currently by Dr. Redmond Baseman for the tracheostomy.  She is still weak but gaining her strength slowly.  She is currently on Effexor 37.5 twice daily clonazepam 0.25, one 3 times a day a day and taking one half of a transanal 7.5 at bedtime along with Seroquel 3 at nighttime.  The patient is positive and definitely improving with her strength.  The patient is more upbeat and positive.  She denies any chest pain pressure or tightness.  She is still having some edema especially in the left lower extremity and this is most likely from some injuries that she has had in the past and she is taking a whole Lasix at the current time every day.  We will make sure that we get a BMP and CBC as she leaves the office today.  She is not having any trouble swallowing and her bowels are moving well and she is passing her water without problems.  Review of Systems  Constitutional: Negative.   HENT: Negative.   Eyes: Negative.   Respiratory: Negative.   Cardiovascular: Negative.   Gastrointestinal: Negative.   Endocrine: Negative.   Genitourinary: Negative.   Musculoskeletal: Negative.   Skin: Negative.   Allergic/Immunologic: Negative.   Neurological: Negative.   Hematological: Negative.   Psychiatric/Behavioral: Negative.        Patient Active Problem List   Diagnosis  Date Noted  . Tracheostomy status ([place])   . Airway obstruction   . Dyspnea on exertion   . Stridor   . Acute diastolic congestive heart failure ([place])   . Dysphagia   . Anxiety state   . Debility 08/12/2017  . PAF (paroxysmal atrial fibrillation) ([place])   . Pressure injury of skin 08/04/2017  . Tracheostomy dependence ([place])   . Acute encephalopathy   . HCAP (healthcare-associated pneumonia)   . Acute respiratory failure ([place])   . RVF (right ventricular failure) ([place])   . RVAD (right ventricular assist device) present ([place])   . Cardiogenic shock ([place])   . S/P AVR 07/10/2017  . Nephrolithiasis 04/07/2017  . Vitamin B 12 deficiency 03/01/2016  . Ascending aortic aneurysm ([place]) 08/20/2015  . Thyroid cancer ([place]) 07/30/2015  . Left thyroid nodule 07/27/2015  . Osteoporosis with fracture 04/16/2014  . At high risk for falls 04/16/2014  . Proximal humerus fracture 05/30/2011  . CHEST PAIN, PRECORDIAL 02/26/2010  . Hyperlipidemia 02/23/2010  . Depression 02/23/2010  . GLAUCOMA 02/23/2010  . CATARACTS 02/23/2010  . RHEUMATIC FEVER 02/23/2010  . Essential hypertension 02/23/2010  . MITRAL VALVE PROLAPSE 02/23/2010  . GERD 02/23/2010  . Osteoarthritis 02/23/2010  . Primary fibromyalgia syndrome 02/23/2010   Outpatient Encounter Medications as of 11/28/2017  Medication Sig  . aspirin 81 MG chewable tablet Chew 81 mg by mouth daily.  . clonazePAM (  KLONOPIN) 0.25 MG disintegrating tablet Take 1 tablet (0.25 mg total) by mouth 3 (three) times daily. (Patient taking differently: Take 0.25-0.375 mg by mouth See admin instructions. Take 1 tablet every morning and afternoon then take 1 and 1/2 tablets at bedtime)  . furosemide (LASIX) 20 MG tablet 20 mg. Take 1/2 tablet 10 mg by mouth daily   . guaiFENesin (ROBITUSSIN) 100 MG/5ML SOLN Take 5 mLs (100 mg total) by mouth 3 (three) times daily with meals.  . latanoprost (XALATAN) 0.005 % ophthalmic solution Place 1 drop into both eyes at bedtime.    Marland Kitchen levothyroxine (SYNTHROID, LEVOTHROID) 100 MCG tablet 100 mcg. Take one tablet by mouth every other day  . levothyroxine (SYNTHROID, LEVOTHROID) 112 MCG tablet Take 112 mcg by mouth every other day.  . loratadine (CLARITIN) 10 MG tablet Take 1 tablet (10 mg total) by mouth daily.  . Multiple Vitamin (MULTIVITAMIN WITH MINERALS) TABS tablet Take 1 tablet by mouth daily.  . potassium chloride (K-DUR,KLOR-CON) 10 MEQ tablet Take 10 mEq by mouth 2 (two) times daily.  Marland Kitchen PROCTOZONE-HC 2.5 % rectal cream APPLY RECTALLY 2 TIMES A DAY AS NEEDED  . PROCTOZONE-HC 2.5 % rectal cream APPLY RECTALLY 2 TIMES A DAY AS NEEDED  . QUEtiapine (SEROQUEL) 25 MG tablet TAKE 3 TABLETS AT BEDTIME AS DIRECTED  . venlafaxine (EFFEXOR) 37.5 MG tablet Take 1 tablet (37.5 mg total) by mouth 2 (two) times daily.   Facility-Administered Encounter Medications as of 11/28/2017  Medication  . cyanocobalamin ((VITAMIN B-12)) injection 1,000 mcg       Objective:   Physical Exam  Constitutional: She is oriented to person, place, and time. She appears well-developed and well-nourished. No distress.  Patient is getting to be more like herself she is using a walker and has a tracheostomy in place but is talking well and having no problems with the tracheostomy as far as swallowing.  HENT:  Right Ear: External ear normal.  Left Ear: External ear normal.  Nose: Nose normal.  Mouth/Throat: Oropharynx is clear and moist. No oropharyngeal exudate.  Eyes: Pupils are equal, round, and reactive to light. Conjunctivae and EOM are normal. Right eye exhibits no discharge. Left eye exhibits no discharge. No scleral icterus.  Neck: Normal range of motion. Neck supple. No thyromegaly present.  Tracheostomy in place  Cardiovascular: Normal rate, regular rhythm and normal heart sounds.  No murmur heard. Heart is regular at 84/min  Pulmonary/Chest: Effort normal and breath sounds normal. No respiratory distress. She has no wheezes. She has  no rales.  Lungs are clear anteriorly and posteriorly  Abdominal: Soft. Bowel sounds are normal. She exhibits no mass. There is no tenderness. There is no rebound and no guarding.  No abdominal tenderness masses or liver or spleen enlargement.  Musculoskeletal: Normal range of motion. She exhibits edema. She exhibits no tenderness.  Strength and range of motion are improving and she is using a walker in the office but does not use one at home.  There is slight edema of the left lower extremity.  She will continue to elevate both of the lower extremities during the day for 20 minutes or so at a time.  Lymphadenopathy:    She has no cervical adenopathy.  Neurological: She is alert and oriented to person, place, and time. She has normal reflexes. No cranial nerve deficit.  Skin: Skin is warm and dry.  Psychiatric: She has a normal mood and affect. Her behavior is normal. Judgment and thought content normal.  Mood affect and behavior are normal.  Nursing note and vitals reviewed.   BP 124/80   Pulse 88   Temp 98 F (36.7 C) (Oral)   Ht 5' 5"  (1.651 m)   Wt 153 lb 9.6 oz (69.7 kg)   BMI 25.56 kg/m   B12 shot to be given today     Assessment & Plan:  1. Anxiety and depression -Seems to be stable and improving with her current treatment regimen.  She will continue with clonazepam Seroquel Effexor and one half of the Tranxene at nighttime to help her rest  2. Essential hypertension -Blood pressure is good today on 20 of Lasix daily and on 10 mEq of potassium twice daily. - BMP8+EGFR - CBC with Differential/Platelet  3. Thyroid cancer ([place]) -Continue current thyroid treatment and follow-up with surgeon as needed  4. Senile purpura ([place]) -Patient continues to have some bruising.  5. Edema, unspecified type -Minimal edema left lower extremity stay on Lasix 20 mg daily  6. Status post aortic valve repair -Continue to follow-up with cardiologist and vascular surgeon as planned  7.  Vitamin B 12 deficiency -B12 injection today with B12 level  8. Ascending aortic aneurysm [facility]) -Follow-up with vascular surgeon as planned  Patient Instructions  Follow-up with cardiology as planned Follow-up with vascular surgery as planned Follow-up with the ENT as planned especially for procedure this coming Friday to work on beginning to remove the tracheostomy. Continue to drink plenty of fluids and stay well-hydrated Continue to be careful not put yourself at risk for falling Continue with current medications for anxiety and depression which include Effexor clonazepam Seroquel and one half of the transiting at bedtime  No orders of the defined types were placed in this encounter.  Arrie Senate MD

## 2017-11-28 NOTE — Pre-Procedure Instructions (Signed)
[name]  11/28/2017      [place], [place] [address] Phone: [phone] Fax: [phone]    Your procedure is scheduled on Friday, December 01, 2017  Report to [facility] Admitting at 7:45 A.M.  Call this number if you have problems the morning of surgery:  [phone]   Remember:  NOTHING TO EAT OR DRINK AFTER MIDNIGHT    Take these medicines the morning of surgery with A SIP OF WATER : clonazePAM (KLONOPIN)  levothyroxine (SYNTHROID, LEVOTHROID) loratadine (CLARITIN) venlafaxine (EFFEXOR) EYE DROPS IF NEEDED 7 days prior to surgery STOP taking any Aspirin (unless otherwise instructed by your surgeon), Aleve, Naproxen, Ibuprofen, Motrin, Advil, Goody's, BC's, all herbal medications, fish oil, and all vitamins  Follow your doctors instructions regarding your Aspirin.  If no instructions were given by your doctor, then you will need to call the prescribing office office to get instructions.      Do not wear jewelry, make-up or nail polish.  Do not wear lotions, powders, or perfumes, or deodorant.  Do not shave 48 hours prior to surgery.    Do not bring valuables to the hospital.  [facility] is not responsible for any belongings or valuables.  Contacts, dentures or bridgework may not be worn into surgery.  Leave your suitcase in the car.  After surgery it may be brought to your room.  For patients admitted to the hospital, discharge time will be determined by your treatment team.  Patients discharged the day of surgery will not be allowed to drive home.    Special instructions:   [place]- Preparing For Surgery  Before surgery, you can play an important role. Because skin is not sterile, your skin needs to be as free of germs as possible. You can reduce the number of germs on your skin by washing with CHG (chlorahexidine gluconate) Soap before surgery.  CHG is an antiseptic cleaner  which kills germs and bonds with the skin to continue killing germs even after washing.    Oral Hygiene is also important to reduce your risk of infection.  Remember - BRUSH YOUR TEETH THE MORNING OF SURGERY WITH YOUR REGULAR TOOTHPASTE  Please do not use if you have an allergy to CHG or antibacterial soaps. If your skin becomes reddened/irritated stop using the CHG.  Do not shave (including legs and underarms) for at least 48 hours prior to first CHG shower. It is OK to shave your face.  Please follow these instructions carefully.   1. Shower the NIGHT BEFORE SURGERY and the MORNING OF SURGERY with CHG.   2. If you chose to wash your hair, wash your hair first as usual with your normal shampoo.  3. After you shampoo, rinse your hair and body thoroughly to remove the shampoo.  4. Use CHG as you would any other liquid soap. You can apply CHG directly to the skin and wash gently with a scrungie or a clean washcloth.   5. Apply the CHG Soap to your body ONLY FROM THE NECK DOWN.  Do not use on open wounds or open sores. Avoid contact with your eyes, ears, mouth and genitals (private parts). Wash Face and genitals (private parts)  with your normal soap.  6. Wash thoroughly, paying special attention to the area where your surgery will be performed.  7. Thoroughly rinse your body with warm water from the neck down.  8.  DO NOT shower/wash with your normal soap after using and rinsing off the CHG Soap.  9. Pat yourself dry with a CLEAN TOWEL.  10. Wear CLEAN PAJAMAS to bed the night before surgery, wear comfortable clothes the morning of surgery  11. Place CLEAN SHEETS on your bed the night of your first shower and DO NOT SLEEP WITH PETS.    Day of Surgery:  Do not apply any deodorants/lotions.  Please wear clean clothes to the hospital/surgery center.   Remember to brush your teeth WITH YOUR REGULAR TOOTHPASTE.    Please read over the following fact sheets that you were  given.

## 2017-11-29 ENCOUNTER — Telehealth: Payer: Self-pay | Admitting: Family Medicine

## 2017-11-29 LAB — CBC WITH DIFFERENTIAL/PLATELET
BASOS ABS: 0 10*3/uL (ref 0.0–0.2)
Basos: 0 %
EOS (ABSOLUTE): 0.2 10*3/uL (ref 0.0–0.4)
Eos: 2 %
HEMOGLOBIN: 12.2 g/dL (ref 11.1–15.9)
Hematocrit: 36.7 % (ref 34.0–46.6)
IMMATURE GRANS (ABS): 0 10*3/uL (ref 0.0–0.1)
Immature Granulocytes: 0 %
LYMPHS: 35 %
Lymphocytes Absolute: 2.3 10*3/uL (ref 0.7–3.1)
MCH: 27.9 pg (ref 26.6–33.0)
MCHC: 33.2 g/dL (ref 31.5–35.7)
MCV: 84 fL (ref 79–97)
MONOCYTES: 8 %
Monocytes Absolute: 0.5 10*3/uL (ref 0.1–0.9)
NEUTROS PCT: 55 %
Neutrophils Absolute: 3.6 10*3/uL (ref 1.4–7.0)
Platelets: 313 10*3/uL (ref 150–450)
RBC: 4.37 x10E6/uL (ref 3.77–5.28)
RDW: 14.3 % (ref 12.3–15.4)
WBC: 6.6 10*3/uL (ref 3.4–10.8)

## 2017-11-29 LAB — BMP8+EGFR
BUN/Creatinine Ratio: 10 — ABNORMAL LOW (ref 12–28)
BUN: 8 mg/dL (ref 8–27)
CALCIUM: 8.5 mg/dL — AB (ref 8.7–10.3)
CHLORIDE: 99 mmol/L (ref 96–106)
CO2: 28 mmol/L (ref 20–29)
Creatinine, Ser: 0.81 mg/dL (ref 0.57–1.00)
GFR calc Af Amer: 82 mL/min/{1.73_m2} (ref >59)
GFR calc non Af Amer: 71 mL/min/{1.73_m2} (ref >59)
GLUCOSE: 85 mg/dL (ref 65–99)
Potassium: 3.7 mmol/L (ref 3.5–5.2)
Sodium: 143 mmol/L (ref 134–144)

## 2017-11-29 LAB — VITAMIN B12: VITAMIN B 12: 910 pg/mL (ref 232–1245)

## 2017-11-29 NOTE — Telephone Encounter (Signed)
Lm [id number]-jhb

## 2017-11-29 NOTE — Progress Notes (Signed)
Anesthesia Chart Review:   Case:  [id number] Date/Time:  12/01/17 0930   Procedure:  SUSPENDED DIRECT LARYNGOSCOPY WITH BOTOX INJECTION (N/A )   Anesthesia type:  General   Pre-op diagnosis:  LARYNGEAL STENOSES   Location:  [facility] OR ROOM 09 / [facility]   Surgeon:  Melida Quitter, MD      DISCUSSION: - Pt is a 77 year old female s/p aortic valve replacement and thoracic aortic aneurysm replacement.   - Pt still trach dependent  - Hospitalized 1/14-2/16/19: AVR/ascending aortic replacement 0/17/51 complicated by tamponade and VF arrest; RVAD was placed, significant hematoma evacuated 07/11/17. Pt required ventilatory management as well as significant inotropic support. RVAD removed 07/17/17. She was weaned from the ventilator but did not tolerate this. She required reintubation and placement of tracheostomy.  A feeding tube was placed. Hospitalization also complicated by acute encephalopathy, anemia of critical illness requiring transfusion, thrombocytopenia, post-op afib, acute kidney injury. Discharged to inpatient rehab.    - Cardiologist Dr. Percival Spanish and CT surgeon Dr. [name] are aware of upcoming procedure   VS: BP 140/70   Pulse 78   Temp (!) 36.4 C   Resp 18   Ht 5\' 5"  (1.651 m)   Wt 153 lb (69.4 kg)   SpO2 100%   BMI 25.46 kg/m    PROVIDERS: PCP is Chipper Herb, MD   Patient Care Team: Steffanie Rainwater, DPM as Consulting Physician (Podiatry) Minus Breeding, MD as Consulting Physician (Cardiology) Netta Cedars, MD as Consulting Physician (Orthopedic Surgery) Clarene Essex, MD as Consulting Physician (Gastroenterology) Irine Seal, MD as Attending Physician (Urology) Latanya Maudlin, MD as Consulting Physician (Orthopedic Surgery)  CT surgeon is Ivin Poot, MD  LABS: Labs reviewed: Acceptable for surgery. (all labs ordered are listed, but only abnormal results are displayed)  Labs Reviewed  BASIC METABOLIC PANEL - Abnormal; Notable for the following components:   Result Value   Potassium 3.3 (*)    BUN 5 (*)    Calcium 8.5 (*)    All other components within normal limits  CBC     IMAGES:  CXR 09/13/17: The left lung is now clear. Minimal residual atelectasis on the right.   EKG 11/01/17: sinus tachycardiac (105 bpm). LAD. RBBB. Inferior infarct, age undetermined.    CV:  Echo 07/22/17:  - Left ventricle: The cavity size was normal. There was severe concentric hypertrophy. Systolic function was vigorous. The estimated ejection fraction was in the range of 65% to 70%. Wall motion was normal; there were no regional wall motion abnormalities. Left ventricular diastolic function parameters were normal. - Aortic valve: A bioprosthesis was present and functioning normally. There was mild stenosis. Valve area (VTI): 0.77 cm^2. Valve area (Vmax): 0.75 cm^2. Valve area (Vmean): 0.76 cm^2. - Aorta: S/P ascending aorta replacement - normal ascendig aortic size. Aortic root dimension: 28 mm (ED). - Aortic root: The aortic root was normal in size. - Right atrium: The atrium was severely dilated. - Tricuspid valve: There was moderate regurgitation. - Pulmonary arteries: PA peak pressure: 38 mm Hg (S). - Impressions: The right ventricular systolic pressure was increased consistent with mild pulmonary hypertension.  Carotid duplex 07/06/17:  - Right Carotid: There is evidence in the right ICA of a 1-39% stenosis. - Left Carotid: There is evidence in the left ICA of a 1-39% stenosis. - Vertebrals: Both vertebral arteries were patent with antegrade flow.  R/L cardiac cath 06/13/17:  Findings: Ao = 142/59 (93) LV =  149/10 RA =  1 RV =  32/2 PA =  30/5 (18) PCW = 10 Fick cardiac output/index = 7.0/3.8 PVR = 1.2 WU Ao sat = 99% PA sat = 73%, 74% SVC sat = 75%  Assessment: 1. Normal coronary arteries with left dominant system 2. Separate ostia for LAD and dominant LCX 3. RCA imaged non-selectively. Small nondominant vessel. Normal 4. Large ascending  aortic aneursym 5, Normal LV function EF 55% 6. Normal hemodynamics with high cardiac output 7. No evidence of intracardiac shunting    Past Medical History:  Diagnosis Date  . ACL tear    right  Dr. Gladstone Pih   . Adenomatous polyp   . Anxiety   . Arthritis   . Ascending aortic aneurysm ([place])    note per chart per Dr Lucianne Lei Tright 4.7 cm 04/15/2015   . B12 deficiency   . Cataracts, both eyes 10/2006  . Chronic bronchitis ([place])   . Depression   . DUB (dysfunctional uterine bleeding) 10/96  . Fibromyalgia   . GERD (gastroesophageal reflux disease)   . Glaucoma    bilaterally  . Helicobacter pylori (H. pylori)   . Hiatal hernia   . History of bronchitis   . History of kidney stones   . History of left shoulder fracture    pt states fell off bed and broke ball in shoulder had rod placed   . Hyperlipidemia   . Hypertension   . Hypothyroidism   . Laryngeal stenosis   . Occasional tremors    left arm   . Osteoarthritis   . Osteopenia   . Osteoporosis   . Thyroid cancer ([place])   . Thyroid nodule   . Tracheostomy in place [facility])     Past Surgical History:  Procedure Laterality Date  . AORTIC VALVE REPLACEMENT N/A 07/10/2017   Procedure: AORTIC VALVE REPLACEMENT (AVR);  Surgeon: [name], Collier Salina, MD;  Location: [place];  Service: Open Heart Surgery;  Laterality: N/A;  . APPENDECTOMY    . BUNIONECTOMY  08/1999   right - Dr. Irving Shows   . CHOLECYSTECTOMY  1979  . DILATION AND CURETTAGE OF UTERUS  03/31/95   Dr. Ovid Curd   . EXPLORATION POST OPERATIVE OPEN HEART N/A 07/11/2017   Procedure: EXPLORATION POST OPERATIVE OPEN HEART;  Surgeon: Ivin Poot, MD;  Location: [place];  Service: Open Heart Surgery;  Laterality: N/A;  . EYE SURGERY     also had left cataract removed   . FRACTURE SURGERY    . HEMATOMA EVACUATION N/A 07/11/2017   Procedure: EVACUATION HEMATOMA;  Surgeon: Ivin Poot, MD;  Location: [place];  Service: Open Heart Surgery;  Laterality: N/A;  . HERNIA REPAIR    .  NEPHROLITHOTOMY Left 04/07/2017   Procedure: NEPHROLITHOTOMY PERCUTANEOUS WITH SURGEON ACCESS;  Surgeon: Ardis Hughs, MD;  [facility];  Service: Urology;  Laterality: Left;  . ORIF HUMERUS FRACTURE  05/30/2011   Procedure: OPEN REDUCTION INTERNAL FIXATION (ORIF) PROXIMAL HUMERUS FRACTURE;  Surgeon: Augustin Schooling;  Location: [place];  Service: Orthopedics;  Laterality: Left;  open reduction internal fixation of proximal humerus fracture  . PLACEMENT OF CENTRIMAG VENTRICULAR ASSIST DEVICE Right 07/11/2017   Procedure: PLACEMENT OF CENTRIMAG VENTRICULAR ASSIST DEVICE;  Surgeon: Ivin Poot, MD;  Location: [place];  Service: Open Heart Surgery;  Laterality: Right;  . REMOVAL OF CENTRIMAG VENTRICULAR ASSIST DEVICE N/A 07/17/2017   Procedure: REMOVAL OF RVAD WITH PUMP STANDBY;  Surgeon: Ivin Poot, MD;  Location: [place];  Service:  Open Heart Surgery;  Laterality: N/A;  . REPLACEMENT ASCENDING AORTA N/A 07/10/2017   Procedure: REPLACEMENT ASCENDING AORTA;  Surgeon: Ivin Poot, MD;  Location: [place];  Service: Open Heart Surgery;  Laterality: N/A;  . right knee replacement  3/11   Dr. Gladstone Pih  . RIGHT/LEFT HEART CATH AND CORONARY ANGIOGRAPHY N/A 06/13/2017   Procedure: RIGHT/LEFT HEART CATH AND CORONARY ANGIOGRAPHY;  Surgeon: Jolaine Artist, MD;  Location: [place] CV LAB;  Service: Cardiovascular;  Laterality: N/A;  . rt. eye cataract  05/28/07  . TEE WITHOUT CARDIOVERSION N/A 07/10/2017   Procedure: TRANSESOPHAGEAL ECHOCARDIOGRAM (TEE);  Surgeon: [name], Collier Salina, MD;  Location: [place];  Service: Open Heart Surgery;  Laterality: N/A;  . TEE WITHOUT CARDIOVERSION  07/11/2017   Procedure: TRANSESOPHAGEAL ECHOCARDIOGRAM (TEE);  Surgeon: [name], Collier Salina, MD;  Location: [place];  Service: Open Heart Surgery;;  . TEE WITHOUT CARDIOVERSION N/A 07/17/2017   Procedure: TRANSESOPHAGEAL ECHOCARDIOGRAM (TEE);  Surgeon: [name], Collier Salina, MD;  Location: [place];  Service: Open Heart Surgery;   Laterality: N/A;  . THYROID LOBECTOMY Left 07/27/2015   Procedure: LEFT THYROID LOBECTOMY;  Surgeon: Fanny Skates, MD;  [facility];  Service: General;  Laterality: Left;  . THYROIDECTOMY N/A 08/06/2015   Procedure: RIGHT THYROID LOBECTOMY, REIMPLANTATION PARATHYROID;  Surgeon: Fanny Skates, MD;  [facility];  Service: General;  Laterality: N/A;  . VENTRAL HERNIA REPAIR  2/99    MEDICATIONS: . aspirin 81 MG chewable tablet  . clonazePAM (KLONOPIN) 0.25 MG disintegrating tablet  . clorazepate (TRANXENE) 7.5 MG tablet  . furosemide (LASIX) 20 MG tablet  . guaiFENesin (ROBITUSSIN) 100 MG/5ML SOLN  . latanoprost (XALATAN) 0.005 % ophthalmic solution  . levothyroxine (SYNTHROID, LEVOTHROID) 100 MCG tablet  . levothyroxine (SYNTHROID, LEVOTHROID) 112 MCG tablet  . loratadine (CLARITIN) 10 MG tablet  . Multiple Vitamin (MULTIVITAMIN WITH MINERALS) TABS tablet  . potassium chloride (K-DUR,KLOR-CON) 10 MEQ tablet  . PROCTOZONE-HC 2.5 % rectal cream  . PROCTOZONE-HC 2.5 % rectal cream  . QUEtiapine (SEROQUEL) 25 MG tablet  . venlafaxine (EFFEXOR) 37.5 MG tablet   . cyanocobalamin ((VITAMIN B-12)) injection 1,000 mcg    If no changes, I anticipate pt can proceed with surgery as scheduled.   Willeen Cass, FNP-BC [facility] Stay [facility]/Anesthesiology Phone: [phone] 11/29/2017 10:09 AM

## 2017-12-01 ENCOUNTER — Ambulatory Visit (HOSPITAL_COMMUNITY): Payer: Medicare Other | Admitting: Emergency Medicine

## 2017-12-01 ENCOUNTER — Encounter (HOSPITAL_COMMUNITY): Payer: Self-pay | Admitting: Certified Registered Nurse Anesthetist

## 2017-12-01 ENCOUNTER — Encounter (HOSPITAL_COMMUNITY)
Admission: RE | Disposition: A | Discharge status: Home / Self Care | Payer: Self-pay | Source: Ambulatory Visit | Attending: Otolaryngology

## 2017-12-01 ENCOUNTER — Ambulatory Visit (HOSPITAL_COMMUNITY): Payer: Medicare Other | Admitting: Certified Registered Nurse Anesthetist

## 2017-12-01 ENCOUNTER — Ambulatory Visit (HOSPITAL_COMMUNITY)
Admission: RE | Admit: 2017-12-01 | Discharge: 2017-12-01 | Disposition: A | Discharge status: Home / Self Care | Payer: Medicare Other | Source: Ambulatory Visit | Attending: Otolaryngology | Admitting: Otolaryngology

## 2017-12-01 DIAGNOSIS — M797 Fibromyalgia: Secondary | ICD-10-CM | POA: Insufficient documentation

## 2017-12-01 DIAGNOSIS — Z79899 Other long term (current) drug therapy: Secondary | ICD-10-CM | POA: Insufficient documentation

## 2017-12-01 DIAGNOSIS — I10 Essential (primary) hypertension: Secondary | ICD-10-CM | POA: Diagnosis not present

## 2017-12-01 DIAGNOSIS — E039 Hypothyroidism, unspecified: Secondary | ICD-10-CM | POA: Insufficient documentation

## 2017-12-01 DIAGNOSIS — Z930 Tracheostomy status: Secondary | ICD-10-CM | POA: Insufficient documentation

## 2017-12-01 DIAGNOSIS — J386 Stenosis of larynx: Secondary | ICD-10-CM | POA: Insufficient documentation

## 2017-12-01 DIAGNOSIS — Z7982 Long term (current) use of aspirin: Secondary | ICD-10-CM | POA: Insufficient documentation

## 2017-12-01 DIAGNOSIS — F329 Major depressive disorder, single episode, unspecified: Secondary | ICD-10-CM | POA: Insufficient documentation

## 2017-12-01 DIAGNOSIS — E785 Hyperlipidemia, unspecified: Secondary | ICD-10-CM | POA: Diagnosis not present

## 2017-12-01 DIAGNOSIS — I5031 Acute diastolic (congestive) heart failure: Secondary | ICD-10-CM | POA: Diagnosis not present

## 2017-12-01 DIAGNOSIS — R061 Stridor: Secondary | ICD-10-CM | POA: Diagnosis not present

## 2017-12-01 DIAGNOSIS — K219 Gastro-esophageal reflux disease without esophagitis: Secondary | ICD-10-CM | POA: Insufficient documentation

## 2017-12-01 DIAGNOSIS — F419 Anxiety disorder, unspecified: Secondary | ICD-10-CM | POA: Insufficient documentation

## 2017-12-01 DIAGNOSIS — I110 Hypertensive heart disease with heart failure: Secondary | ICD-10-CM | POA: Diagnosis not present

## 2017-12-01 DIAGNOSIS — J0410 Acute tracheitis without obstruction: Secondary | ICD-10-CM | POA: Diagnosis not present

## 2017-12-01 HISTORY — PX: DIRECT LARYNGOSCOPY WITH BOTOX INJECTION: SHX5327

## 2017-12-01 SURGERY — LARYNGOSCOPY, DIRECT, WITH BOTULINUM TOXIN INJECTION
Anesthesia: General

## 2017-12-01 MED ORDER — SUGAMMADEX SODIUM 200 MG/2ML IV SOLN
INTRAVENOUS | Status: DC | PRN
  Administered 2017-12-01: 150 mg via INTRAVENOUS

## 2017-12-01 MED ORDER — LIDOCAINE 2% (20 MG/ML) 5 ML SYRINGE
INTRAMUSCULAR | Status: DC | PRN
  Administered 2017-12-01: 60 mg via INTRAVENOUS

## 2017-12-01 MED ORDER — HYDROCODONE-ACETAMINOPHEN 5-325 MG PO TABS: 1.0000 | ORAL_TABLET | Freq: Four times a day (QID) | ORAL | 0 refills | Status: DC | PRN

## 2017-12-01 MED ORDER — PROPOFOL 10 MG/ML IV BOLUS
INTRAVENOUS | Status: AC
  Filled 2017-12-01: qty 20

## 2017-12-01 MED ORDER — LACTATED RINGERS IV SOLN
INTRAVENOUS | Status: DC | PRN
  Administered 2017-12-01 (×2): via INTRAVENOUS

## 2017-12-01 MED ORDER — TRIAMCINOLONE ACETONIDE 40 MG/ML IJ SUSP
INTRAMUSCULAR | Status: DC | PRN
  Administered 2017-12-01: .6 mL via INTRAMUSCULAR

## 2017-12-01 MED ORDER — LACTATED RINGERS IV SOLN
INTRAVENOUS | Status: DC
  Administered 2017-12-01: 10 mL/h via INTRAVENOUS

## 2017-12-01 MED ORDER — ONDANSETRON HCL 4 MG/2ML IJ SOLN
INTRAMUSCULAR | Status: DC | PRN
  Administered 2017-12-01: 4 mg via INTRAVENOUS

## 2017-12-01 MED ORDER — ROCURONIUM BROMIDE 10 MG/ML (PF) SYRINGE
PREFILLED_SYRINGE | INTRAVENOUS | Status: DC | PRN
  Administered 2017-12-01: 20 mg via INTRAVENOUS
  Administered 2017-12-01: 10 mg via INTRAVENOUS

## 2017-12-01 MED ORDER — SODIUM CHLORIDE 0.9 % IJ SOLN
INTRAMUSCULAR | Status: AC
  Filled 2017-12-01: qty 10

## 2017-12-01 MED ORDER — DEXAMETHASONE SODIUM PHOSPHATE 10 MG/ML IJ SOLN
INTRAMUSCULAR | Status: DC | PRN
  Administered 2017-12-01: 10 mg via INTRAVENOUS

## 2017-12-01 MED ORDER — DEXAMETHASONE SODIUM PHOSPHATE 10 MG/ML IJ SOLN
INTRAMUSCULAR | Status: AC
  Filled 2017-12-01: qty 1

## 2017-12-01 MED ORDER — FENTANYL CITRATE (PF) 250 MCG/5ML IJ SOLN
INTRAMUSCULAR | Status: AC
  Filled 2017-12-01: qty 5

## 2017-12-01 MED ORDER — ROCURONIUM BROMIDE 10 MG/ML (PF) SYRINGE
PREFILLED_SYRINGE | INTRAVENOUS | Status: AC
  Filled 2017-12-01: qty 5

## 2017-12-01 MED ORDER — LIDOCAINE 2% (20 MG/ML) 5 ML SYRINGE
INTRAMUSCULAR | Status: AC
  Filled 2017-12-01: qty 5

## 2017-12-01 MED ORDER — PROPOFOL 10 MG/ML IV BOLUS
INTRAVENOUS | Status: DC | PRN
  Administered 2017-12-01: 50 mg via INTRAVENOUS
  Administered 2017-12-01: 100 mg via INTRAVENOUS

## 2017-12-01 MED ORDER — SUGAMMADEX SODIUM 200 MG/2ML IV SOLN
INTRAVENOUS | Status: AC
  Filled 2017-12-01: qty 2

## 2017-12-01 MED ORDER — ONDANSETRON HCL 4 MG/2ML IJ SOLN
INTRAMUSCULAR | Status: AC
  Filled 2017-12-01: qty 2

## 2017-12-01 MED ORDER — FENTANYL CITRATE (PF) 250 MCG/5ML IJ SOLN
INTRAMUSCULAR | Status: DC | PRN
  Administered 2017-12-01 (×4): 50 ug via INTRAVENOUS

## 2017-12-01 SURGICAL SUPPLY — 31 items
BALLN PULM 15 16.5 18X75 (BALLOONS)
BALLOON PULM 15 16.5 18X75 (BALLOONS) IMPLANT
CANISTER SUCT 3000ML PPV (MISCELLANEOUS) ×2 IMPLANT
CONT SPEC 4OZ CLIKSEAL STRL BL (MISCELLANEOUS) IMPLANT
CONT SPEC STER OR (MISCELLANEOUS) ×1 IMPLANT
COVER BACK TABLE 60X90IN (DRAPES) ×2 IMPLANT
COVER MAYO STAND STRL (DRAPES) ×1 IMPLANT
CRADLE DONUT ADULT HEAD (MISCELLANEOUS) ×1 IMPLANT
DRAPE HALF SHEET 40X57 (DRAPES) ×2 IMPLANT
GAUZE SPONGE 4X4 12PLY STRL (GAUZE/BANDAGES/DRESSINGS) IMPLANT
GAUZE SPONGE 4X4 16PLY XRAY LF (GAUZE/BANDAGES/DRESSINGS) ×2 IMPLANT
GLOVE BIO SURGEON STRL SZ7.5 (GLOVE) ×1 IMPLANT
GOWN STRL REUS W/ TWL LRG LVL3 (GOWN DISPOSABLE) IMPLANT
GOWN STRL REUS W/TWL LRG LVL3 (GOWN DISPOSABLE) ×4
GUARD TEETH (MISCELLANEOUS) ×1 IMPLANT
KIT PROLARN PLUS GEL W/NDL (Miscellaneous) ×1 IMPLANT
KIT TURNOVER KIT B (KITS) ×2 IMPLANT
MARKER SKIN DUAL TIP RULER LAB (MISCELLANEOUS) IMPLANT
NDL HYPO 25GX1X1/2 BEV (NEEDLE) IMPLANT
NDL TRANS ORAL INJECTION (NEEDLE) ×1 IMPLANT
NEEDLE HYPO 25GX1X1/2 BEV (NEEDLE) IMPLANT
NEEDLE TRANS ORAL INJECTION (NEEDLE) ×2 IMPLANT
NS IRRIG 1000ML POUR BTL (IV SOLUTION) ×1 IMPLANT
PAD ARMBOARD 7.5X6 YLW CONV (MISCELLANEOUS) ×4 IMPLANT
PATTIES SURGICAL .5 X3 (DISPOSABLE) IMPLANT
SET COLLECT BLD 21X3/4 12 PB (MISCELLANEOUS) ×1 IMPLANT
SOLUTION ANTI FOG 6CC (MISCELLANEOUS) ×1 IMPLANT
SURGILUBE 2OZ TUBE FLIPTOP (MISCELLANEOUS) IMPLANT
TOWEL OR 17X24 6PK STRL BLUE (TOWEL DISPOSABLE) ×4 IMPLANT
TUBE CONNECTING 12X1/4 (SUCTIONS) ×2 IMPLANT
WATER STERILE IRR 1000ML POUR (IV SOLUTION) IMPLANT

## 2017-12-01 NOTE — Op Note (Signed)
NAME: [name], [place] MEDICAL RECORD QP:[id number] ACCOUNT [id number] DATE OF BIRTH:1941-06-17 FACILITY[facility] LOCATION: [facility] PHYSICIAN:Jurnee Nakayama Guido Sander, MD  OPERATIVE REPORT  DATE OF PROCEDURE:  12/01/2017  PREOPERATIVE DIAGNOSIS:  Laryngeal stenosis.  POSTOPERATIVE DIAGNOSIS:  Laryngeal stenosis.  PROCEDURE:  Suspended microdirect laryngoscopy with CO2 laser excision and Kenalog injection.  SURGEON:  Melida Quitter, MD  ANESTHESIA:  General endotracheal anesthesia.  COMPLICATIONS:  None.  INDICATION:  The patient is a 77 year old female who had a complicated course following aortic aneurysm management including requiring tracheostomy placement.  She has been unable to be decannulated because of stridor and difficulty breathing and was  found to have what appeared to be posterior glottic stenosis at least in the office.  She presents to the operating room for surgical evaluation and potential management.  FINDINGS:  She had a #4 cuffless trach tube in place.  She makes a good voice around the trach tube with a Passy-Muir valve in place.  Upon laryngoscopy, the vocal folds are puffy appearing.  There is a bit of a scar band in the posterior commissure  limiting excursion of the vocal folds.  In the subglottic airway and proximal trachea, there was prominent granulation-type tissue on the anterior wall of the trachea obstructing the subglottic airway space over 75%.    DESCRIPTION OF PROCEDURE:  The patient was identified in the holding room, informed consent having been obtained after discussion of risks, benefits, alternatives.  The patient was brought to the operating room in supine position.  Anesthesia was  induced.  The patient's tracheostomy was exchanged for a laser-safe endotracheal tube.  The tube was taped in place, and the patient's bed was turned 90 degrees from anesthesia.  The eyes were taped closed, and a tooth guard was placed over the upper  teeth.  The larynx was  then exposed using a Storz laryngoscope.  It was placed in suspension on the Mayo stand in a glottic position.  A 0-degree telescope was used to make photographs of the airway and evaluate it fully.  Findings are noted above.  At  this point, the operating microscope was brought into the field with the CO2 laser set to 8 watts, and subglottic and proximal tracheal tissue on the anterior wall primarily was then removed using the CO2 laser.  This was done until the endotracheal tube  was well visualized from above.  Some of the tissue was sent for pathology.  Epinephrine-soaked pledgets were used before and after laser use.  After this was completed and the subglottic airway appeared more patent, the posterior commissure scar band  was injected with Kenalog 40 using a butterfly needle and syringe totalling about 0.5 mL in the posterior commissure totalling 2 locations.  After this was completed, the airway was suctioned.  A postoperative photograph was made, and the larynx was  sprayed with topical lidocaine.  The laryngoscope was taken out of suspension and removed from the patient's mouth.  The tooth guard was removed.  The patient was turned back to anesthesia for wake-up.  The endotracheal tube was exchanged back for a  cuffless Shiley trach tube without difficulty.  She was allowed to wake up further and was moved to the recovery room in stable condition.  LN/NUANCE  D:12/01/2017 T:12/01/2017 JOB:[id number]

## 2017-12-01 NOTE — Anesthesia Preprocedure Evaluation (Addendum)
Anesthesia Evaluation  Patient identified by MRN, date of birth, ID band Patient awake    Reviewed: Allergy & Precautions, NPO status , Patient's Chart, lab work & pertinent test results  Airway Mallampati: II  TM Distance: >3 FB Neck ROM: Full   Comment: [place]  (+) Dental Advisory Given, Chipped,    Pulmonary  Trach dependent   breath sounds clear to auscultation       Cardiovascular hypertension, + Peripheral Vascular Disease and +CHF   Rhythm:Regular Rate:Normal  Echo 07/22/17:  - Left ventricle: The cavity size was normal. There was severeconcentric hypertrophy. Systolic function was vigorous. Theestimated ejection fraction was in the range of 65% to 70%. Wallmotion was normal; there were no regional wall motionabnormalities. Left ventricular diastolic function parameterswere normal. - Aortic valve: A bioprosthesis was present and functioningnormally. There was mild stenosis. Valve area (VTI): 0.77 cm^2.Valve area (Vmax): 0.75 cm^2. Valve area (Vmean): 0.76 cm^2. - Aorta: S/P ascending aorta replacement - normal ascendig aorticsize. Aortic root dimension: 28 mm (ED). - Aortic root: The aortic root was normal in size. - Right atrium: The atrium was severely dilated. - Tricuspid valve: There was moderate regurgitation. - Pulmonary arteries: PA peak pressure: 38 mm Hg (S). - Impressions: The right ventricular systolic pressure was increased consistentwith mild pulmonary hypertension.     Neuro/Psych Anxiety Depression negative neurological ROS     GI/Hepatic Neg liver ROS, hiatal hernia, GERD  ,  Endo/Other  Hypothyroidism   Renal/GU negative Renal ROS     Musculoskeletal  (+) Arthritis , Fibromyalgia -  Abdominal   Peds  Hematology negative hematology ROS (+)   Anesthesia Other Findings   Reproductive/Obstetrics                           Lab Results  Component Value Date   WBC 6.6 11/28/2017   HGB 12.2 11/28/2017   HCT 36.7 11/28/2017   MCV 84 11/28/2017   PLT 313 11/28/2017   Lab Results  Component Value Date   CREATININE 0.81 11/28/2017   BUN 8 11/28/2017   NA 143 11/28/2017   K 3.7 11/28/2017   CL 99 11/28/2017   CO2 28 11/28/2017    Anesthesia Physical Anesthesia Plan  ASA: III  Anesthesia Plan: General   Post-op Pain Management:    Induction: Intravenous  PONV Risk Score and Plan: 3 and Dexamethasone, Ondansetron and Treatment may vary due to age or medical condition  Airway Management Planned: Tracheostomy  Additional Equipment:   Intra-op Plan:   Post-operative Plan: Extubation in OR  Informed Consent: I have reviewed the patients History and Physical, chart, labs and discussed the procedure including the risks, benefits and alternatives for the proposed anesthesia with the patient or authorized representative who has indicated his/her understanding and acceptance.     Plan Discussed with: CRNA and Surgeon  Anesthesia Plan Comments:         Anesthesia Quick Evaluation

## 2017-12-01 NOTE — Anesthesia Procedure Notes (Signed)
Procedure Name: Intubation Date/Time: 12/01/2017 9:59 AM Performed by: Harden Mo, CRNA Pre-anesthesia Checklist: Patient identified, Emergency Drugs available, Suction available and Patient being monitored Patient Re-evaluated:Patient Re-evaluated prior to induction Oxygen Delivery Method: Circle System Utilized Preoxygenation: Pre-oxygenation with 100% oxygen Induction Type: IV induction Tube type: Oral Tube size: 6.0 mm Number of attempts: 1 Placement Confirmation: positive ETCO2 and breath sounds checked- equal and bilateral Secured at: 9 cm Tube secured with: Tape Dental Injury: Teeth and Oropharynx as per pre-operative assessment  Comments: Intubation by Emi Holes, SRNA through existing tracheal stoma.

## 2017-12-01 NOTE — H&P (Signed)
[name] is an 77 y.o. female.   Chief Complaint: Laryngeal stenosis HPI: 77 year old female with laryngeal stenosis related to critical care management of aortic aneurysm.  She presents for surgical evaluation.  Past Medical History:  Diagnosis Date  . ACL tear    right  Dr. Gladstone Pih   . Adenomatous polyp   . Anxiety   . Arthritis   . Ascending aortic aneurysm ([place])    note per chart per Dr Lucianne Lei Tright 4.7 cm 04/15/2015   . B12 deficiency   . Cataracts, both eyes 10/2006  . Chronic bronchitis ([place])   . Depression   . DUB (dysfunctional uterine bleeding) 10/96  . Fibromyalgia   . GERD (gastroesophageal reflux disease)   . Glaucoma    bilaterally  . Helicobacter pylori (H. pylori)   . Hiatal hernia   . History of bronchitis   . History of kidney stones   . History of left shoulder fracture    pt states fell off bed and broke ball in shoulder had rod placed   . Hyperlipidemia   . Hypertension   . Hypothyroidism   . Laryngeal stenosis   . Occasional tremors    left arm   . Osteoarthritis   . Osteopenia   . Osteoporosis   . Thyroid cancer ([place])   . Thyroid nodule   . Tracheostomy in place [facility])     Past Surgical History:  Procedure Laterality Date  . AORTIC VALVE REPLACEMENT N/A 07/10/2017   Procedure: AORTIC VALVE REPLACEMENT (AVR);  Surgeon: [name], Collier Salina, MD;  Location: [place];  Service: Open Heart Surgery;  Laterality: N/A;  . APPENDECTOMY    . BUNIONECTOMY  08/1999   right - Dr. Irving Shows   . CHOLECYSTECTOMY  1979  . DILATION AND CURETTAGE OF UTERUS  03/31/95   Dr. Ovid Curd   . EXPLORATION POST OPERATIVE OPEN HEART N/A 07/11/2017   Procedure: EXPLORATION POST OPERATIVE OPEN HEART;  Surgeon: Ivin Poot, MD;  Location: [place];  Service: Open Heart Surgery;  Laterality: N/A;  . EYE SURGERY     also had left cataract removed   . FRACTURE SURGERY    . HEMATOMA EVACUATION N/A 07/11/2017   Procedure: EVACUATION HEMATOMA;  Surgeon: Ivin Poot, MD;  Location:  [facility];  Service: Open Heart Surgery;  Laterality: N/A;  . HERNIA REPAIR    . NEPHROLITHOTOMY Left 04/07/2017   Procedure: NEPHROLITHOTOMY PERCUTANEOUS WITH SURGEON ACCESS;  Surgeon: Ardis Hughs, MD;  Location[facility];  Service: Urology;  Laterality: Left;  . ORIF HUMERUS FRACTURE  05/30/2011   Procedure: OPEN REDUCTION INTERNAL FIXATION (ORIF) PROXIMAL HUMERUS FRACTURE;  Surgeon: Augustin Schooling;  Location: [place];  Service: Orthopedics;  Laterality: Left;  open reduction internal fixation of proximal humerus fracture  . PLACEMENT OF CENTRIMAG VENTRICULAR ASSIST DEVICE Right 07/11/2017   Procedure: PLACEMENT OF CENTRIMAG VENTRICULAR ASSIST DEVICE;  Surgeon: Ivin Poot, MD;  Location: [place];  Service: Open Heart Surgery;  Laterality: Right;  . REMOVAL OF CENTRIMAG VENTRICULAR ASSIST DEVICE N/A 07/17/2017   Procedure: REMOVAL OF RVAD WITH PUMP STANDBY;  Surgeon: Ivin Poot, MD;  Location: [place];  Service: Open Heart Surgery;  Laterality: N/A;  . REPLACEMENT ASCENDING AORTA N/A 07/10/2017   Procedure: REPLACEMENT ASCENDING AORTA;  Surgeon: Ivin Poot, MD;  Location: [place];  Service: Open Heart Surgery;  Laterality: N/A;  . right knee replacement  3/11   Dr. Gladstone Pih  . RIGHT/LEFT HEART  CATH AND CORONARY ANGIOGRAPHY N/A 06/13/2017   Procedure: RIGHT/LEFT HEART CATH AND CORONARY ANGIOGRAPHY;  Surgeon: Jolaine Artist, MD;  Location: [place] [facility];  Service: Cardiovascular;  Laterality: N/A;  . rt. eye cataract  05/28/07  . TEE WITHOUT CARDIOVERSION N/A 07/10/2017   Procedure: TRANSESOPHAGEAL ECHOCARDIOGRAM (TEE);  Surgeon: [name], Collier Salina, MD;  Location: [place];  Service: Open Heart Surgery;  Laterality: N/A;  . TEE WITHOUT CARDIOVERSION  07/11/2017   Procedure: TRANSESOPHAGEAL ECHOCARDIOGRAM (TEE);  Surgeon: [name], Collier Salina, MD;  Location: [place];  Service: Open Heart Surgery;;  . TEE WITHOUT CARDIOVERSION N/A 07/17/2017   Procedure: TRANSESOPHAGEAL ECHOCARDIOGRAM (TEE);   Surgeon: [name], Collier Salina, MD;  Location: [place];  Service: Open Heart Surgery;  Laterality: N/A;  . THYROID LOBECTOMY Left 07/27/2015   Procedure: LEFT THYROID LOBECTOMY;  Surgeon: Fanny Skates, MD;  [facility];  Service: General;  Laterality: Left;  . THYROIDECTOMY N/A 08/06/2015   Procedure: RIGHT THYROID LOBECTOMY, REIMPLANTATION PARATHYROID;  Surgeon: Fanny Skates, MD;  [facility];  Service: General;  Laterality: N/A;  . VENTRAL HERNIA REPAIR  2/99    Family History  Problem Relation Age of Onset  . Cancer Mother        PANCREATIC   . Osteoporosis Mother   . Hip fracture Mother   . Heart attack Father 63       Died 68  . Heart disease Father   . Arthritis Father   . Hypertension Sister   . Cancer Sister        skin  . Hypertension Sister   . Breast cancer Neg Hx    Social History:  reports that she has never smoked. She has never used smokeless tobacco. She reports that she does not drink alcohol or use drugs.  Allergies:  Allergies  Allergen Reactions  . Actonel [Risedronate] Other (See Comments)    Bone pain and nausea   . Lipitor [Atorvastatin Calcium] Other (See Comments)    myalgias  . Lyrica [Pregabalin] Other (See Comments)    SORES IN MOUTH   . Sibutramine Hcl Monohydrate Other (See Comments)    UNSPECIFIED REACTION   . Alendronate Sodium Nausea Only  . Latex Rash and Other (See Comments)    RA to latex 1982; includes bandaids    . Levofloxacin Nausea And Vomiting  . Meloxicam Other (See Comments)    Vaginal itching     Facility-Administered Medications Prior to Admission  Medication Dose Route Frequency Provider Last Rate Last Dose  . cyanocobalamin ((VITAMIN B-12)) injection 1,000 mcg  1,000 mcg Intramuscular Q30 days Chipper Herb, MD   1,000 mcg at 11/28/17 1536   Medications Prior to Admission  Medication Sig Dispense Refill  . aspirin 81 MG chewable tablet Chew 81 mg by mouth daily.    . clonazePAM (KLONOPIN) 0.25 MG  disintegrating tablet Take 1 tablet (0.25 mg total) by mouth 3 (three) times daily. (Patient taking differently: Take 0.25-0.375 mg by mouth See admin instructions. Take 1 tablet every morning and afternoon then take 1 and 1/2 tablets at bedtime) 90 tablet 3  . clorazepate (TRANXENE) 7.5 MG tablet Take 7.5 mg by mouth 2 (two) times daily as needed for anxiety.    . furosemide (LASIX) 20 MG tablet Take 10 mg by mouth daily. Take 1/2 tablet 10 mg by mouth daily     . guaiFENesin (ROBITUSSIN) 100 MG/5ML SOLN Take 5 mLs (100 mg total) by mouth 3 (three) times daily with  meals. (Patient taking differently: Take 5 mLs by mouth every 6 (six) hours as needed for cough. ) 1200 mL 0  . latanoprost (XALATAN) 0.005 % ophthalmic solution Place 1 drop into both eyes at bedtime. 2.5 mL 12  . levothyroxine (SYNTHROID, LEVOTHROID) 100 MCG tablet Take 100 mcg by mouth See admin instructions. Take one tablet by mouth every other day (alternates doses)    . levothyroxine (SYNTHROID, LEVOTHROID) 112 MCG tablet Take 112 mcg by mouth See admin instructions. Alternates doses    . loratadine (CLARITIN) 10 MG tablet Take 1 tablet (10 mg total) by mouth daily. 30 tablet 11  . Multiple Vitamin (MULTIVITAMIN WITH MINERALS) TABS tablet Take 1 tablet by mouth daily.    . potassium chloride (K-DUR,KLOR-CON) 10 MEQ tablet Take 10 mEq by mouth 2 (two) times daily.    Marland Kitchen PROCTOZONE-HC 2.5 % rectal cream APPLY RECTALLY 2 TIMES A DAY AS NEEDED (Patient taking differently: APPLY RECTALLY 2 TIMES A DAY AS NEEDED FOR IRRITATION) 30 g 2  . QUEtiapine (SEROQUEL) 25 MG tablet TAKE 3 TABLETS AT BEDTIME AS DIRECTED 90 tablet 0  . venlafaxine (EFFEXOR) 37.5 MG tablet Take 1 tablet (37.5 mg total) by mouth 2 (two) times daily. 60 tablet 2    No results found for this or any previous visit (from the past 48 hour(s)). No results found.  Review of Systems  All other systems reviewed and are negative.   Blood pressure 133/66, pulse 89,  temperature 98.3 F (36.8 C), temperature source Oral, resp. rate 18, weight 153 lb (69.4 kg), SpO2 100 %. Physical Exam  Constitutional: She is oriented to person, place, and time. She appears well-developed and well-nourished. No distress.  HENT:  Head: Normocephalic and atraumatic.  Right Ear: External ear normal.  Left Ear: External ear normal.  Nose: Nose normal.  Mouth/Throat: Oropharynx is clear and moist.  Eyes: Pupils are equal, round, and reactive to light. Conjunctivae and EOM are normal.  Neck: Normal range of motion. Neck supple.  #4 cuffless Shiley trach in place, Passy-Muir valve.  Cardiovascular: Normal rate.  Respiratory: Effort normal.  Musculoskeletal: Normal range of motion.  Neurological: She is alert and oriented to person, place, and time. No cranial nerve deficit.  Skin: Skin is warm and dry.  Psychiatric: She has a normal mood and affect. Her behavior is normal. Judgment and thought content normal.     Assessment/Plan Laryngeal stenosis, inspiratory stridor, tracheostomy status To OR for microlaryngoscopy with steroid injection.  Melida Quitter, MD 12/01/2017, 9:42 AM

## 2017-12-01 NOTE — Anesthesia Postprocedure Evaluation (Signed)
Anesthesia Post Note  Patient: [name]  Procedure(s) Performed: SUSPENDED DIRECT MICROLARYNGOSCOPY WITH KENALOG INJECTION, CO2 LASER (N/A )     Patient location during evaluation: PACU Anesthesia Type: General Level of consciousness: awake and alert Pain management: pain level controlled Vital Signs Assessment: post-procedure vital signs reviewed and stable Respiratory status: spontaneous breathing, nonlabored ventilation, respiratory function stable and patient connected to nasal cannula oxygen Cardiovascular status: blood pressure returned to baseline and stable Postop Assessment: no apparent nausea or vomiting Anesthetic complications: no    Last Vitals:  Vitals:   12/01/17 1145 12/01/17 1150  BP:  134/72  Pulse: 75 85  Resp: (!) 25 20  Temp:  (!) 36.3 C  SpO2: 100% 100%    Last Pain:  Vitals:   12/01/17 1150  TempSrc:   PainSc: 0-No pain                 Tiajuana Amass

## 2017-12-01 NOTE — Brief Op Note (Signed)
12/01/2017  11:10 AM  PATIENT:  [name]  77 y.o. female  PRE-OPERATIVE DIAGNOSIS:  LARYNGEAL STENOSES  POST-OPERATIVE DIAGNOSIS:  LARYNGEAL STENOSES  PROCEDURE:  Procedure(s): SUSPENDED DIRECT MICROLARYNGOSCOPY WITH KENALOG INJECTION, CO2 LASER (N/A)  SURGEON:  Surgeon(s) and Role:    * Melida Quitter, MD - Primary  PHYSICIAN ASSISTANT:   ASSISTANTS: none   ANESTHESIA:   general  EBL:  20 mL   BLOOD ADMINISTERED:none  DRAINS: none   LOCAL MEDICATIONS USED:  NONE  SPECIMEN:  Source of Specimen:  Proximal tracheal tissue  DISPOSITION OF SPECIMEN:  PATHOLOGY  COUNTS:  YES  TOURNIQUET:  * No tourniquets in log *  DICTATION: .Other Dictation: Dictation Number [id number]  PLAN OF CARE: Discharge to home after PACU  PATIENT DISPOSITION:  PACU - hemodynamically stable.   Delay start of Pharmacological VTE agent (>24hrs) due to surgical blood loss or risk of bleeding: yes

## 2017-12-01 NOTE — Transfer of Care (Signed)
Immediate Anesthesia Transfer of Care Note  Patient: [name]  Procedure(s) Performed: SUSPENDED DIRECT MICROLARYNGOSCOPY WITH KENALOG INJECTION, CO2 LASER (N/A )  Patient Location: PACU  Anesthesia Type:General  Level of Consciousness: awake and alert   Airway & Oxygen Therapy: Patient Spontanous Breathing  Post-op Assessment: Report given to RN, Post -op Vital signs reviewed and stable and Patient moving all extremities X 4  Post vital signs: Reviewed and stable  Last Vitals:  Vitals Value Taken Time  BP 123/83 12/01/2017 11:25 AM  Temp    Pulse 103 12/01/2017 11:25 AM  Resp 24 12/01/2017 11:25 AM  SpO2 97 % 12/01/2017 11:25 AM  Vitals shown include unvalidated device data.  Last Pain:  Vitals:   12/01/17 0807  TempSrc:   PainSc: 0-No pain      Patients Stated Pain Goal: 3 (17/49/44 9675)  Complications: No apparent anesthesia complications

## 2017-12-02 ENCOUNTER — Other Ambulatory Visit: Payer: Self-pay | Admitting: Family Medicine

## 2017-12-02 DIAGNOSIS — R5381 Other malaise: Secondary | ICD-10-CM | POA: Diagnosis not present

## 2017-12-02 DIAGNOSIS — Z930 Tracheostomy status: Secondary | ICD-10-CM | POA: Diagnosis not present

## 2017-12-03 DIAGNOSIS — R5381 Other malaise: Secondary | ICD-10-CM | POA: Diagnosis not present

## 2017-12-03 DIAGNOSIS — Z930 Tracheostomy status: Secondary | ICD-10-CM | POA: Diagnosis not present

## 2017-12-12 ENCOUNTER — Encounter (HOSPITAL_COMMUNITY): Payer: Self-pay | Admitting: Otolaryngology

## 2017-12-12 ENCOUNTER — Ambulatory Visit (INDEPENDENT_AMBULATORY_CARE_PROVIDER_SITE_OTHER): Payer: Medicare Other

## 2017-12-12 DIAGNOSIS — Z430 Encounter for attention to tracheostomy: Secondary | ICD-10-CM

## 2017-12-12 DIAGNOSIS — I110 Hypertensive heart disease with heart failure: Secondary | ICD-10-CM | POA: Diagnosis not present

## 2017-12-12 DIAGNOSIS — M6281 Muscle weakness (generalized): Secondary | ICD-10-CM | POA: Diagnosis not present

## 2017-12-12 DIAGNOSIS — I5030 Unspecified diastolic (congestive) heart failure: Secondary | ICD-10-CM

## 2017-12-15 DIAGNOSIS — Z930 Tracheostomy status: Secondary | ICD-10-CM | POA: Diagnosis not present

## 2017-12-15 DIAGNOSIS — R061 Stridor: Secondary | ICD-10-CM | POA: Diagnosis not present

## 2017-12-15 DIAGNOSIS — J386 Stenosis of larynx: Secondary | ICD-10-CM | POA: Diagnosis not present

## 2017-12-18 ENCOUNTER — Ambulatory Visit (HOSPITAL_COMMUNITY)
Admission: RE | Admit: 2017-12-18 | Discharge: 2017-12-18 | Disposition: A | Discharge status: Home / Self Care | Payer: Medicare Other | Source: Ambulatory Visit | Attending: Cardiothoracic Surgery | Admitting: Cardiothoracic Surgery

## 2017-12-18 DIAGNOSIS — Z85850 Personal history of malignant neoplasm of thyroid: Secondary | ICD-10-CM | POA: Diagnosis not present

## 2017-12-18 DIAGNOSIS — Z952 Presence of prosthetic heart valve: Secondary | ICD-10-CM | POA: Diagnosis not present

## 2017-12-18 DIAGNOSIS — E785 Hyperlipidemia, unspecified: Secondary | ICD-10-CM | POA: Diagnosis not present

## 2017-12-18 DIAGNOSIS — K219 Gastro-esophageal reflux disease without esophagitis: Secondary | ICD-10-CM | POA: Insufficient documentation

## 2017-12-18 DIAGNOSIS — Z48812 Encounter for surgical aftercare following surgery on the circulatory system: Secondary | ICD-10-CM | POA: Diagnosis present

## 2017-12-18 DIAGNOSIS — I071 Rheumatic tricuspid insufficiency: Secondary | ICD-10-CM | POA: Diagnosis not present

## 2017-12-18 DIAGNOSIS — I371 Nonrheumatic pulmonary valve insufficiency: Secondary | ICD-10-CM | POA: Diagnosis not present

## 2017-12-18 DIAGNOSIS — I10 Essential (primary) hypertension: Secondary | ICD-10-CM | POA: Diagnosis not present

## 2017-12-18 DIAGNOSIS — Z930 Tracheostomy status: Secondary | ICD-10-CM | POA: Diagnosis not present

## 2017-12-18 DIAGNOSIS — I480 Paroxysmal atrial fibrillation: Secondary | ICD-10-CM | POA: Insufficient documentation

## 2017-12-18 DIAGNOSIS — I019 Acute rheumatic heart disease, unspecified: Secondary | ICD-10-CM | POA: Diagnosis not present

## 2017-12-18 NOTE — Progress Notes (Signed)
*  PRELIMINARY RESULTS* Echocardiogram 2D Echocardiogram has been performed.  [name] 12/18/2017, 10:31 AM

## 2017-12-20 ENCOUNTER — Other Ambulatory Visit: Payer: Self-pay | Admitting: Otolaryngology

## 2017-12-26 ENCOUNTER — Ambulatory Visit: Payer: Medicare Other | Admitting: Family Medicine

## 2017-12-26 ENCOUNTER — Encounter: Payer: Self-pay | Admitting: Family Medicine

## 2017-12-26 VITALS — BP 125/79 | HR 89 | Temp 97.9°F | Ht 65.0 in | Wt 145.0 lb

## 2017-12-26 DIAGNOSIS — F419 Anxiety disorder, unspecified: Secondary | ICD-10-CM | POA: Diagnosis not present

## 2017-12-26 DIAGNOSIS — F329 Major depressive disorder, single episode, unspecified: Secondary | ICD-10-CM | POA: Diagnosis not present

## 2017-12-26 DIAGNOSIS — F32A Depression, unspecified: Secondary | ICD-10-CM

## 2017-12-26 DIAGNOSIS — K5901 Slow transit constipation: Secondary | ICD-10-CM | POA: Diagnosis not present

## 2017-12-26 DIAGNOSIS — E538 Deficiency of other specified B group vitamins: Secondary | ICD-10-CM | POA: Diagnosis not present

## 2017-12-26 DIAGNOSIS — I10 Essential (primary) hypertension: Secondary | ICD-10-CM | POA: Diagnosis not present

## 2017-12-26 NOTE — Patient Instructions (Addendum)
We will not add any new medicines today but keep you on everything that you have been on but change the timing to see if it will help settle down the anxiety that you seem to be having in the evening We would ask you to take the Klonopin upon first arising in the morning and again about 8 hours later and again at bedtime You will continue with the Seroquel at bedtime With the Tranxene 7.5 mg you will take or try one half of these at about 3:00 in the afternoon hoping that it will spillover and help the anxiety that occurs during the evening hours.  If this does not work after 2 to 3 days he will increase this and take a whole Tranxene 7.5 at 3:00 in the afternoon The patient will continue the Effexor twice a day Your next visit with Dr. Redmond Baseman is on July 17 we would ask that you come by this office and get a weight at that time.  If the weight continues to decline we will discuss adding Megace to your current treatment regimen to help stimulate your appetite The patient should continue to make all efforts to eat more even when she does not want to eat and at least take more boost.

## 2017-12-26 NOTE — Progress Notes (Signed)
Subjective:    Patient ID: [name], female    DOB: October 21, 1940, 77 y.o.   MRN: [id number]  HPI  Patient here today for 4 week follow up on anxiety and depression.  The patient's medications and times of taking them have been reviewed.  She is currently taking Klonopin 0.25 by mouth 3 times daily.  She is taking transanal 7.5 mg half a tablet twice a day or half a tablet at bedtime.  She is also taking Seroquel 3 tablets at bedtime.  She is taking Effexor twice daily 37.5.  The patient feels that she is having her most anxious times in the evening may be after she is tired and more fatigued.  She still has more procedures coming up in another return visit with the ear nose and throat specialist, Dr. Redmond Baseman.  She is anxious to get this tracheostomy out.  She is also planning a trip to [place] soon with her husband and looking forward to going on that.  Her daughter is also going on the trip.  During the visit we had a long discussion about how the medicines are being taken and how we can cover that time the most anxiety to help settle that anxiety down.  We would eventually like to get to the point that were not taking any Tranxene at all and just working with the [place] by itself.  Patient Active Problem List   Diagnosis Date Noted  . Tracheostomy status ([place])   . Airway obstruction   . Dyspnea on exertion   . Stridor   . Acute diastolic congestive heart failure ([place])   . Dysphagia   . Anxiety state   . Debility 08/12/2017  . PAF (paroxysmal atrial fibrillation) ([place])   . Pressure injury of skin 08/04/2017  . Tracheostomy dependence ([place])   . Acute encephalopathy   . HCAP (healthcare-associated pneumonia)   . Acute respiratory failure ([place])   . RVF (right ventricular failure) ([place])   . RVAD (right ventricular assist device) present ([place])   . Cardiogenic shock ([place])   . S/P AVR 07/10/2017  . Nephrolithiasis 04/07/2017  . Vitamin B 12 deficiency 03/01/2016  . Ascending aortic  aneurysm ([place]) 08/20/2015  . Thyroid cancer ([place]) 07/30/2015  . Left thyroid nodule 07/27/2015  . Osteoporosis with fracture 04/16/2014  . At high risk for falls 04/16/2014  . Proximal humerus fracture 05/30/2011  . CHEST PAIN, PRECORDIAL 02/26/2010  . Hyperlipidemia 02/23/2010  . Depression 02/23/2010  . GLAUCOMA 02/23/2010  . CATARACTS 02/23/2010  . RHEUMATIC FEVER 02/23/2010  . Essential hypertension 02/23/2010  . MITRAL VALVE PROLAPSE 02/23/2010  . GERD 02/23/2010  . Osteoarthritis 02/23/2010  . Primary fibromyalgia syndrome 02/23/2010   Outpatient Encounter Medications as of 12/26/2017  Medication Sig  . aspirin 81 MG chewable tablet Chew 81 mg by mouth daily.  . clonazePAM (KLONOPIN) 0.25 MG disintegrating tablet Take 1 tablet (0.25 mg total) by mouth 3 (three) times daily. (Patient taking differently: Take 0.25-0.375 mg by mouth See admin instructions. Take 1 tablet every morning and afternoon then take 1 and 1/2 tablets at bedtime)  . clorazepate (TRANXENE) 7.5 MG tablet TAKE 1 TABLET DAILY AS NEEDED  . furosemide (LASIX) 20 MG tablet Take 20 mg by mouth daily.   Marland Kitchen guaiFENesin (ROBITUSSIN) 100 MG/5ML SOLN Take 5 mLs (100 mg total) by mouth 3 (three) times daily with meals. (Patient taking differently: Take 5 mLs by mouth every 6 (six) hours as needed for cough. )  .  HYDROcodone-acetaminophen (NORCO/VICODIN) 5-325 MG tablet Take 1-2 tablets by mouth every 6 (six) hours as needed for moderate pain.  Marland Kitchen latanoprost (XALATAN) 0.005 % ophthalmic solution Place 1 drop into both eyes at bedtime.  Marland Kitchen levothyroxine (SYNTHROID, LEVOTHROID) 100 MCG tablet Take 100 mcg by mouth See admin instructions. Take one tablet by mouth every other day (alternates doses)  . levothyroxine (SYNTHROID, LEVOTHROID) 112 MCG tablet Take 112 mcg by mouth See admin instructions. Alternates doses  . loratadine (CLARITIN) 10 MG tablet Take 1 tablet (10 mg total) by mouth daily.  . Multiple Vitamin (MULTIVITAMIN  WITH MINERALS) TABS tablet Take 1 tablet by mouth daily.  . potassium chloride (K-DUR,KLOR-CON) 10 MEQ tablet Take 10 mEq by mouth 2 (two) times daily.  Marland Kitchen PROCTOZONE-HC 2.5 % rectal cream APPLY RECTALLY 2 TIMES A DAY AS NEEDED (Patient taking differently: APPLY RECTALLY 2 TIMES A DAY AS NEEDED FOR IRRITATION)  . QUEtiapine (SEROQUEL) 25 MG tablet TAKE 3 TABLETS AT BEDTIME AS DIRECTED  . venlafaxine (EFFEXOR) 37.5 MG tablet Take 1 tablet (37.5 mg total) by mouth 2 (two) times daily.  . [DISCONTINUED] clorazepate (TRANXENE) 7.5 MG tablet Take 7.5 mg by mouth 2 (two) times daily as needed for anxiety.   Facility-Administered Encounter Medications as of 12/26/2017  Medication  . cyanocobalamin ((VITAMIN B-12)) injection 1,000 mcg      Review of Systems  Constitutional: Positive for appetite change (decrease - down 9 lbs).  HENT: Negative.   Eyes: Negative.   Respiratory: Negative.   Cardiovascular: Negative.   Gastrointestinal: Negative.   Endocrine: Negative.   Genitourinary: Negative.   Musculoskeletal: Negative.   Skin: Negative.   Allergic/Immunologic: Negative.   Neurological: Negative.   Hematological: Negative.   Psychiatric/Behavioral: The patient is nervous/anxious (worse in the evenings - sleeps good).        Objective:   Physical Exam  Constitutional: She is oriented to person, place, and time. She appears well-developed and well-nourished. She appears distressed.  The patient is somewhat distressed about worrying especially in the evening and worries about several things including the upcoming surgeries that may be done and issues with constipation.  HENT:  Head: Normocephalic and atraumatic.  Eyes: Pupils are equal, round, and reactive to light. Conjunctivae and EOM are normal. Right eye exhibits no discharge. Left eye exhibits no discharge.  Musculoskeletal: Normal range of motion.  Neurological: She is alert and oriented to person, place, and time.  Skin: Skin is warm  and dry. No rash noted.  Psychiatric: She has a normal mood and affect. Her behavior is normal. Judgment and thought content normal.  Mood and affect are improving but there is still a lot of anxiety and stress.  Nursing note and vitals reviewed.  BP 125/79 (BP Location: Left Arm)   Pulse 89   Temp 97.9 F (36.6 C) (Oral)   Ht _0  (1.651 m)   Wt 145 lb (65.8 kg)   BMI 24.13 kg/m         Assessment & Plan:  1. Essential hypertension -Blood pressure is good and she will continue with current treatment - CBC with Differential/Platelet - BMP8+EGFR  2. Anxiety and depression -This is still an issue and her husband came with the patient to the visit today and we will change around slightly how she is taking her medicines.  She will continue with the Klonopin when she arises at 5:00 in the evening and at bedtime.  She will continue with the Seroquel 3 pills at bedtime.  She will continue with the Effexor 1 twice a day.  She will change the dosing of the Tranxene 7.5 to 1/2 pill at 3 in the afternoon and after trying that for 2 to 3 days can go up to a whole pill at 3 in the afternoon.  3.  Constipation -For constipation issues she will start with MiraLAX and take this daily and add a stool softener like Colace Monday Wednesday and Friday.  She will continue to try to drink plenty of fluids and stay well-hydrated  Patient Instructions  We will not add any new medicines today but keep you on everything that you have been on but change the timing to see if it will help settle down the anxiety that you seem to be having in the evening We would ask you to take the Klonopin upon first arising in the morning and again about 8 hours later and again at bedtime You will continue with the Seroquel at bedtime With the Tranxene 7.5 mg you will take or try one half of these at about 3:00 in the afternoon hoping that it will spillover and help the anxiety that occurs during the evening hours.  If this  does not work after 2 to 3 days he will increase this and take a whole Tranxene 7.5 at 3:00 in the afternoon The patient will continue the Effexor twice a day Your next visit with Dr. Redmond Baseman is on July 17 we would ask that you come by this office and get a weight at that time.  If the weight continues to decline we will discuss adding Megace to your current treatment regimen to help stimulate your appetite The patient should continue to make all efforts to eat more even when she does not want to eat and at least take more boost.  Arrie Senate MD

## 2017-12-27 ENCOUNTER — Ambulatory Visit: Payer: Medicare Other | Admitting: Family Medicine

## 2017-12-27 ENCOUNTER — Other Ambulatory Visit: Payer: Self-pay | Admitting: Cardiothoracic Surgery

## 2017-12-27 ENCOUNTER — Other Ambulatory Visit: Payer: Self-pay | Admitting: *Deleted

## 2017-12-27 ENCOUNTER — Encounter (HOSPITAL_COMMUNITY): Payer: Self-pay | Admitting: Emergency Medicine

## 2017-12-27 ENCOUNTER — Observation Stay (HOSPITAL_COMMUNITY)
Admission: EM | Admit: 2017-12-27 | Discharge: 2017-12-28 | Disposition: A | Discharge status: Home / Self Care | Payer: Medicare Other | Attending: Internal Medicine | Admitting: Internal Medicine

## 2017-12-27 ENCOUNTER — Other Ambulatory Visit: Payer: Self-pay

## 2017-12-27 ENCOUNTER — Encounter: Payer: Self-pay | Admitting: Family Medicine

## 2017-12-27 ENCOUNTER — Emergency Department (HOSPITAL_COMMUNITY): Payer: Medicare Other

## 2017-12-27 ENCOUNTER — Other Ambulatory Visit: Payer: Self-pay | Admitting: Family Medicine

## 2017-12-27 VITALS — BP 159/83 | HR 94 | Temp 97.4°F | Ht 65.0 in | Wt 145.0 lb

## 2017-12-27 DIAGNOSIS — R209 Unspecified disturbances of skin sensation: Secondary | ICD-10-CM | POA: Diagnosis not present

## 2017-12-27 DIAGNOSIS — Z85850 Personal history of malignant neoplasm of thyroid: Secondary | ICD-10-CM | POA: Diagnosis not present

## 2017-12-27 DIAGNOSIS — R208 Other disturbances of skin sensation: Secondary | ICD-10-CM | POA: Diagnosis not present

## 2017-12-27 DIAGNOSIS — Z9049 Acquired absence of other specified parts of digestive tract: Secondary | ICD-10-CM | POA: Diagnosis not present

## 2017-12-27 DIAGNOSIS — R251 Tremor, unspecified: Secondary | ICD-10-CM | POA: Diagnosis not present

## 2017-12-27 DIAGNOSIS — Z952 Presence of prosthetic heart valve: Secondary | ICD-10-CM | POA: Diagnosis not present

## 2017-12-27 DIAGNOSIS — I110 Hypertensive heart disease with heart failure: Secondary | ICD-10-CM | POA: Insufficient documentation

## 2017-12-27 DIAGNOSIS — E876 Hypokalemia: Secondary | ICD-10-CM | POA: Diagnosis not present

## 2017-12-27 DIAGNOSIS — Z79899 Other long term (current) drug therapy: Secondary | ICD-10-CM | POA: Insufficient documentation

## 2017-12-27 DIAGNOSIS — F419 Anxiety disorder, unspecified: Secondary | ICD-10-CM | POA: Insufficient documentation

## 2017-12-27 DIAGNOSIS — Z91040 Latex allergy status: Secondary | ICD-10-CM | POA: Insufficient documentation

## 2017-12-27 DIAGNOSIS — Z930 Tracheostomy status: Secondary | ICD-10-CM

## 2017-12-27 DIAGNOSIS — I5031 Acute diastolic (congestive) heart failure: Secondary | ICD-10-CM | POA: Diagnosis not present

## 2017-12-27 DIAGNOSIS — E860 Dehydration: Secondary | ICD-10-CM | POA: Diagnosis not present

## 2017-12-27 DIAGNOSIS — F329 Major depressive disorder, single episode, unspecified: Secondary | ICD-10-CM | POA: Insufficient documentation

## 2017-12-27 DIAGNOSIS — R0602 Shortness of breath: Secondary | ICD-10-CM | POA: Diagnosis not present

## 2017-12-27 DIAGNOSIS — E039 Hypothyroidism, unspecified: Secondary | ICD-10-CM | POA: Diagnosis not present

## 2017-12-27 DIAGNOSIS — Z7982 Long term (current) use of aspirin: Secondary | ICD-10-CM | POA: Diagnosis not present

## 2017-12-27 DIAGNOSIS — R531 Weakness: Secondary | ICD-10-CM | POA: Diagnosis not present

## 2017-12-27 LAB — COMPREHENSIVE METABOLIC PANEL
ALBUMIN: 3.6 g/dL (ref 3.5–5.0)
ALT: 17 U/L (ref 0–44)
ANION GAP: 15 (ref 5–15)
AST: 27 U/L (ref 15–41)
Alkaline Phosphatase: 147 U/L — ABNORMAL HIGH (ref 38–126)
BUN: 9 mg/dL (ref 8–23)
CO2: 36 mmol/L — AB (ref 22–32)
Calcium: 6.4 mg/dL — CL (ref 8.9–10.3)
Chloride: 90 mmol/L — ABNORMAL LOW (ref 98–111)
Creatinine, Ser: 0.81 mg/dL (ref 0.44–1.00)
GFR calc non Af Amer: >60 mL/min (ref >60)
GLUCOSE: 92 mg/dL (ref 70–99)
POTASSIUM: 1.9 mmol/L — AB (ref 3.5–5.1)
SODIUM: 141 mmol/L (ref 135–145)
Total Bilirubin: 1.1 mg/dL (ref 0.3–1.2)
Total Protein: 6.7 g/dL (ref 6.5–8.1)

## 2017-12-27 LAB — CBC WITH DIFFERENTIAL/PLATELET
BASOS ABS: 0 10*3/uL (ref 0.0–0.2)
BASOS ABS: 0 10*3/uL (ref 0.0–0.1)
Basophils Relative: 1 %
Basos: 0 %
EOS (ABSOLUTE): 0.1 10*3/uL (ref 0.0–0.4)
Eos: 2 %
Eosinophils Absolute: 0 10*3/uL (ref 0.0–0.7)
Eosinophils Relative: 1 %
HEMATOCRIT: 37.9 % (ref 36.0–46.0)
HEMOGLOBIN: 12.6 g/dL (ref 12.0–15.0)
Hematocrit: 35 % (ref 34.0–46.6)
Hemoglobin: 12.2 g/dL (ref 11.1–15.9)
Immature Grans (Abs): 0 10*3/uL (ref 0.0–0.1)
Immature Granulocytes: 0 %
LYMPHS ABS: 3.2 10*3/uL — AB (ref 0.7–3.1)
LYMPHS PCT: 32 %
Lymphs Abs: 1.8 10*3/uL (ref 0.7–4.0)
Lymphs: 41 %
MCH: 27.9 pg (ref 26.6–33.0)
MCH: 28.3 pg (ref 26.0–34.0)
MCHC: 34.9 g/dL (ref 31.5–35.7)
MCHC: 33.2 g/dL (ref 30.0–36.0)
MCV: 80 fL (ref 79–97)
MCV: 85 fL (ref 78.0–100.0)
MONO ABS: 0.6 10*3/uL (ref 0.1–1.0)
MONOS PCT: 11 %
Monocytes Absolute: 0.7 10*3/uL (ref 0.1–0.9)
Monocytes: 9 %
NEUTROS ABS: 3.2 10*3/uL (ref 1.7–7.7)
NEUTROS PCT: 55 %
Neutrophils Absolute: 3.8 10*3/uL (ref 1.4–7.0)
Neutrophils: 48 %
PLATELETS: 342 10*3/uL (ref 150–450)
Platelets: 299 10*3/uL (ref 150–400)
RBC: 4.38 x10E6/uL (ref 3.77–5.28)
RBC: 4.46 MIL/uL (ref 3.87–5.11)
RDW: 14.8 % (ref 12.3–15.4)
RDW: 14.6 % (ref 11.5–15.5)
WBC: 7.8 10*3/uL (ref 3.4–10.8)
WBC: 5.6 10*3/uL (ref 4.0–10.5)

## 2017-12-27 LAB — BMP8+EGFR
BUN/Creatinine Ratio: 9 — ABNORMAL LOW (ref 12–28)
BUN: 9 mg/dL (ref 8–27)
CHLORIDE: 88 mmol/L — AB (ref 96–106)
CO2: 32 mmol/L — AB (ref 20–29)
Calcium: 6.9 mg/dL — CL (ref 8.7–10.3)
Creatinine, Ser: 1.01 mg/dL — ABNORMAL HIGH (ref 0.57–1.00)
GFR calc Af Amer: 63 mL/min/{1.73_m2} (ref >59)
GFR calc non Af Amer: 55 mL/min/{1.73_m2} — ABNORMAL LOW (ref >59)
GLUCOSE: 94 mg/dL (ref 65–99)
POTASSIUM: 2.4 mmol/L — AB (ref 3.5–5.2)
SODIUM: 142 mmol/L (ref 134–144)

## 2017-12-27 LAB — URINALYSIS, ROUTINE W REFLEX MICROSCOPIC
BILIRUBIN URINE: NEGATIVE
Glucose, UA: NEGATIVE mg/dL
Hgb urine dipstick: NEGATIVE
Ketones, ur: NEGATIVE mg/dL
LEUKOCYTES UA: NEGATIVE
NITRITE: NEGATIVE
PH: 7 (ref 5.0–8.0)
Protein, ur: NEGATIVE mg/dL
SPECIFIC GRAVITY, URINE: 1.005 (ref 1.005–1.030)

## 2017-12-27 LAB — TROPONIN I: Troponin I: 0.03 ng/mL (ref <0.03)

## 2017-12-27 LAB — MAGNESIUM: MAGNESIUM: 1.1 mg/dL — AB (ref 1.7–2.4)

## 2017-12-27 MED ORDER — LEVOTHYROXINE SODIUM 50 MCG PO TABS: 100.0000 ug | ORAL_TABLET | ORAL | Status: DC

## 2017-12-27 MED ORDER — CALCIUM GLUCONATE 10 % IV SOLN
1.0000 g | Freq: Once | INTRAVENOUS | Status: AC
  Administered 2017-12-27: 1 g via INTRAVENOUS
  Filled 2017-12-27: qty 10

## 2017-12-27 MED ORDER — LATANOPROST 0.005 % OP SOLN
1.0000 [drp] | Freq: Every day | OPHTHALMIC | Status: DC
  Administered 2017-12-27: 1 [drp] via OPHTHALMIC
  Filled 2017-12-27: qty 2.5

## 2017-12-27 MED ORDER — GUAIFENESIN 100 MG/5ML PO SOLN: 5.0000 mL | Freq: Four times a day (QID) | ORAL | Status: DC | PRN

## 2017-12-27 MED ORDER — ACETAMINOPHEN 650 MG RE SUPP: 650.0000 mg | Freq: Four times a day (QID) | RECTAL | Status: DC | PRN

## 2017-12-27 MED ORDER — ONDANSETRON HCL 4 MG PO TABS: 4.0000 mg | ORAL_TABLET | Freq: Four times a day (QID) | ORAL | Status: DC | PRN

## 2017-12-27 MED ORDER — SODIUM CHLORIDE 0.9 % IV BOLUS
1000.0000 mL | Freq: Once | INTRAVENOUS | Status: AC
Start: 2017-12-27 — End: 2017-12-27
  Administered 2017-12-27: 1000 mL via INTRAVENOUS

## 2017-12-27 MED ORDER — POTASSIUM CHLORIDE 10 MEQ/100ML IV SOLN
10.0000 meq | Freq: Once | INTRAVENOUS | Status: AC
  Administered 2017-12-27: 10 meq via INTRAVENOUS
  Filled 2017-12-27: qty 100

## 2017-12-27 MED ORDER — CLONAZEPAM 0.25 MG PO TBDP
0.2500 mg | ORAL_TABLET | Freq: Three times a day (TID) | ORAL | Status: DC
  Administered 2017-12-27 – 2017-12-28 (×3): 0.25 mg via ORAL
  Filled 2017-12-27 (×3): qty 1

## 2017-12-27 MED ORDER — ACETAMINOPHEN 325 MG PO TABS: 650.0000 mg | ORAL_TABLET | Freq: Four times a day (QID) | ORAL | Status: DC | PRN

## 2017-12-27 MED ORDER — POTASSIUM CHLORIDE CRYS ER 20 MEQ PO TBCR
40.0000 meq | EXTENDED_RELEASE_TABLET | Freq: Four times a day (QID) | ORAL | Status: AC
  Administered 2017-12-27 – 2017-12-28 (×3): 40 meq via ORAL
  Filled 2017-12-27 (×3): qty 2

## 2017-12-27 MED ORDER — MAGNESIUM SULFATE 2 GM/50ML IV SOLN
2.0000 g | Freq: Once | INTRAVENOUS | Status: AC
  Administered 2017-12-27: 2 g via INTRAVENOUS
  Filled 2017-12-27: qty 50

## 2017-12-27 MED ORDER — POTASSIUM CHLORIDE CRYS ER 20 MEQ PO TBCR
40.0000 meq | EXTENDED_RELEASE_TABLET | Freq: Once | ORAL | Status: AC
  Administered 2017-12-27: 40 meq via ORAL
  Filled 2017-12-27: qty 2

## 2017-12-27 MED ORDER — LEVOTHYROXINE SODIUM 112 MCG PO TABS
112.0000 ug | ORAL_TABLET | ORAL | Status: DC
  Administered 2017-12-28: 112 ug via ORAL
  Filled 2017-12-27: qty 1

## 2017-12-27 MED ORDER — SODIUM CHLORIDE 0.9 % IV SOLN
1.0000 g | Freq: Once | INTRAVENOUS | Status: AC
  Administered 2017-12-27: 1 g via INTRAVENOUS
  Filled 2017-12-27: qty 10

## 2017-12-27 MED ORDER — HEPARIN SODIUM (PORCINE) 5000 UNIT/ML IJ SOLN
5000.0000 [IU] | Freq: Three times a day (TID) | INTRAMUSCULAR | Status: DC
  Administered 2017-12-27 – 2017-12-28 (×3): 5000 [IU] via SUBCUTANEOUS
  Filled 2017-12-27 (×3): qty 1

## 2017-12-27 MED ORDER — LORAZEPAM 2 MG/ML IJ SOLN
0.5000 mg | Freq: Once | INTRAMUSCULAR | Status: AC
  Administered 2017-12-27: 0.5 mg via INTRAVENOUS
  Filled 2017-12-27: qty 1

## 2017-12-27 MED ORDER — CLORAZEPATE DIPOTASSIUM 7.5 MG PO TABS
3.7500 mg | ORAL_TABLET | Freq: Every day | ORAL | Status: DC
  Administered 2017-12-27: 3.75 mg via ORAL
  Filled 2017-12-27: qty 1

## 2017-12-27 MED ORDER — POTASSIUM CHLORIDE 10 MEQ/100ML IV SOLN
10.0000 meq | INTRAVENOUS | Status: AC
  Administered 2017-12-27 (×3): 10 meq via INTRAVENOUS
  Filled 2017-12-27 (×3): qty 100

## 2017-12-27 MED ORDER — LORATADINE 10 MG PO TABS
10.0000 mg | ORAL_TABLET | Freq: Every day | ORAL | Status: DC
  Administered 2017-12-27 – 2017-12-28 (×2): 10 mg via ORAL
  Filled 2017-12-27 (×2): qty 1

## 2017-12-27 MED ORDER — POTASSIUM CHLORIDE CRYS ER 20 MEQ PO TBCR: 20.0000 meq | EXTENDED_RELEASE_TABLET | Freq: Two times a day (BID) | ORAL | Status: DC

## 2017-12-27 MED ORDER — ASPIRIN 81 MG PO CHEW
81.0000 mg | CHEWABLE_TABLET | Freq: Every day | ORAL | Status: DC
  Administered 2017-12-27 – 2017-12-28 (×2): 81 mg via ORAL
  Filled 2017-12-27 (×2): qty 1

## 2017-12-27 MED ORDER — VENLAFAXINE HCL 37.5 MG PO TABS
37.5000 mg | ORAL_TABLET | Freq: Two times a day (BID) | ORAL | Status: DC
  Administered 2017-12-27 – 2017-12-28 (×2): 37.5 mg via ORAL
  Filled 2017-12-27 (×2): qty 1

## 2017-12-27 MED ORDER — POTASSIUM CHLORIDE CRYS ER 10 MEQ PO TBCR: 10.0000 meq | EXTENDED_RELEASE_TABLET | Freq: Two times a day (BID) | ORAL | 1 refills | Status: DC

## 2017-12-27 MED ORDER — ONDANSETRON HCL 4 MG/2ML IJ SOLN: 4.0000 mg | Freq: Four times a day (QID) | INTRAMUSCULAR | Status: DC | PRN

## 2017-12-27 MED ORDER — ZOLPIDEM TARTRATE 5 MG PO TABS
5.0000 mg | ORAL_TABLET | Freq: Every evening | ORAL | Status: DC | PRN
  Filled 2017-12-27: qty 1

## 2017-12-27 MED ORDER — HYDROCODONE-ACETAMINOPHEN 5-325 MG PO TABS
1.0000 | ORAL_TABLET | Freq: Four times a day (QID) | ORAL | Status: DC | PRN
Start: 2017-12-27 — End: 2017-12-28

## 2017-12-27 NOTE — H&P (Signed)
History and Physical  [name] HCW:[id number] DOB: [birth year] DOA: 12/27/2017   PCP: Chipper Herb, MD   Patient coming from: Home  Chief Complaint: numbness and tingling  HPI:  [name] is a 77 y.o. female with medical history of B12 deficiency, ascending aortic aneurysm, anxiety, hypothyroidism presents with numbness and tingling in the bilateral hands, and bilateral feet.  The patient had a routine visit at her primary care provider's office to adjust her medications for her anxiety and depression.  Routine blood work at that time showed a potassium of 2.4.  The patient was instructed to take her home potassium twice daily as prescribed previously.  She was instructed to return to the clinic for recheck of her potassium on 12/27/2017.  The patient states that she had only been taking her potassium once daily rather than twice daily as prescribed because she was having difficulty with the size of the pill.  Repeat blood work on 12/27/2017 showed a potassium 1.9 with magnesium 1.1.  As result, the patient was directed to the emergency department for further evaluation and treatment.  She denies any new medications, nausea, vomiting, diarrhea.  However, the patient states that her appetite has been poor and she has had decreased po intake.  She denies any fevers, chills, chest pain, cough, hemoptysis, abdominal pain, dysuria, hematuria.  In the emergency department, the patient was afebrile hemodynamically stable saturating 97% on room air.  Magnesium was 1.9.  The remainder of the BMP and LFTs were unremarkable.  Corrected calcium was 6.8.  Magnesium was 1.1.  Chest x-ray was negative for acute findings.  The patient was given 40 mEq KCl in the emergency department p.o. and 10 mg once KCl IV.  She was also given 2 g magnesium and 1 g calcium gluconate.  Assessment/Plan: Dysesthesias -Likely secondary to hypomagnesemia and hypokalemia -Replete magnesium and potassium -Dysesthesias  improving with supplementation  Tremors -Likely secondary to low potassium and low calcium  Hypokalemia -Likely secondary to poor oral intake -Aggressively replete -Am BMP  Hypocalcemia -Give additional 1 g calcium gluconate -Check vitamin D level  Hypomagnesemia -Give additional 2 g of magnesium -Repeat magnesium in a.m.    -12/01/2017--laryngoscopy with CTracheostomy status/tracheal stenosisO2 laser excision and Kenalog injection--Dr. Redmond Baseman -Repeat laryngoscopy planned 01/10/2018  Status post bioprosthetic aortic valve -Remains hemodynamically stable -prolonged hospital stay after AAA repair and aortic valve repair the course was complicated by right ventricular failure requiring right ventricular assist device and prolonged mechanical ventilation and ultimately tracheostomy after failed extubation attempt.  She was discharged from rehabilitation at the end of March 2019          Past Medical History:  Diagnosis Date  . ACL tear    right  Dr. Gladstone Pih   . Adenomatous polyp   . Anxiety   . Arthritis   . Ascending aortic aneurysm ([place])    note per chart per Dr Lucianne Lei Tright 4.7 cm 04/15/2015   . B12 deficiency   . Cataracts, both eyes 10/2006  . Chronic bronchitis ([place])   . Depression   . DUB (dysfunctional uterine bleeding) 10/96  . Fibromyalgia   . GERD (gastroesophageal reflux disease)   . Glaucoma    bilaterally  . Helicobacter pylori (H. pylori)   . Hiatal hernia   . History of bronchitis   . History of kidney stones   . History of left shoulder fracture    pt states fell off bed and broke ball  in shoulder had rod placed   . Hyperlipidemia   . Hypertension   . Hypothyroidism   . Laryngeal stenosis   . Occasional tremors    left arm   . Osteoarthritis   . Osteopenia   . Osteoporosis   . Thyroid cancer ([place])   . Thyroid nodule   . Tracheostomy in place [facility])    Past Surgical History:  Procedure Laterality Date  . AORTIC VALVE REPLACEMENT N/A  07/10/2017   Procedure: AORTIC VALVE REPLACEMENT (AVR);  Surgeon: [name], Collier Salina, MD;  Location: [place];  Service: Open Heart Surgery;  Laterality: N/A;  . APPENDECTOMY    . BUNIONECTOMY  08/1999   right - Dr. Irving Shows   . CHOLECYSTECTOMY  1979  . DILATION AND CURETTAGE OF UTERUS  03/31/95   Dr. Ovid Curd   . DIRECT LARYNGOSCOPY WITH BOTOX INJECTION N/A 12/01/2017   Procedure: SUSPENDED DIRECT MICROLARYNGOSCOPY WITH KENALOG INJECTION, CO2 LASER;  Surgeon: Melida Quitter, MD;  Location: [place];  Service: ENT;  Laterality: N/A;  . EXPLORATION POST OPERATIVE OPEN HEART N/A 07/11/2017   Procedure: EXPLORATION POST OPERATIVE OPEN HEART;  Surgeon: Ivin Poot, MD;  Location: [place];  Service: Open Heart Surgery;  Laterality: N/A;  . EYE SURGERY     also had left cataract removed   . FRACTURE SURGERY    . HEMATOMA EVACUATION N/A 07/11/2017   Procedure: EVACUATION HEMATOMA;  Surgeon: Ivin Poot, MD;  Location: [place];  Service: Open Heart Surgery;  Laterality: N/A;  . HERNIA REPAIR    . NEPHROLITHOTOMY Left 04/07/2017   Procedure: NEPHROLITHOTOMY PERCUTANEOUS WITH SURGEON ACCESS;  Surgeon: Ardis Hughs, MD;  [facility];  Service: Urology;  Laterality: Left;  . ORIF HUMERUS FRACTURE  05/30/2011   Procedure: OPEN REDUCTION INTERNAL FIXATION (ORIF) PROXIMAL HUMERUS FRACTURE;  Surgeon: Augustin Schooling;  Location: [place];  Service: Orthopedics;  Laterality: Left;  open reduction internal fixation of proximal humerus fracture  . PLACEMENT OF CENTRIMAG VENTRICULAR ASSIST DEVICE Right 07/11/2017   Procedure: PLACEMENT OF CENTRIMAG VENTRICULAR ASSIST DEVICE;  Surgeon: Ivin Poot, MD;  Location: [place];  Service: Open Heart Surgery;  Laterality: Right;  . REMOVAL OF CENTRIMAG VENTRICULAR ASSIST DEVICE N/A 07/17/2017   Procedure: REMOVAL OF RVAD WITH PUMP STANDBY;  Surgeon: Ivin Poot, MD;  Location: [place];  Service: Open Heart Surgery;  Laterality: N/A;  . REPLACEMENT ASCENDING AORTA N/A  07/10/2017   Procedure: REPLACEMENT ASCENDING AORTA;  Surgeon: Ivin Poot, MD;  Location: [place];  Service: Open Heart Surgery;  Laterality: N/A;  . right knee replacement  3/11   Dr. Gladstone Pih  . RIGHT/LEFT HEART CATH AND CORONARY ANGIOGRAPHY N/A 06/13/2017   Procedure: RIGHT/LEFT HEART CATH AND CORONARY ANGIOGRAPHY;  Surgeon: Jolaine Artist, MD;  Location: [place] [facility];  Service: Cardiovascular;  Laterality: N/A;  . rt. eye cataract  05/28/07  . TEE WITHOUT CARDIOVERSION N/A 07/10/2017   Procedure: TRANSESOPHAGEAL ECHOCARDIOGRAM (TEE);  Surgeon: [name], Collier Salina, MD;  Location: [place];  Service: Open Heart Surgery;  Laterality: N/A;  . TEE WITHOUT CARDIOVERSION  07/11/2017   Procedure: TRANSESOPHAGEAL ECHOCARDIOGRAM (TEE);  Surgeon: [name], Collier Salina, MD;  Location: [place];  Service: Open Heart Surgery;;  . TEE WITHOUT CARDIOVERSION N/A 07/17/2017   Procedure: TRANSESOPHAGEAL ECHOCARDIOGRAM (TEE);  Surgeon: [name], Collier Salina, MD;  Location: [place];  Service: Open Heart Surgery;  Laterality: N/A;  . THYROID LOBECTOMY Left 07/27/2015   Procedure: LEFT THYROID LOBECTOMY;  Surgeon: Fanny Skates, MD;  [facility];  Service: General;  Laterality: Left;  . THYROIDECTOMY N/A 08/06/2015   Procedure: RIGHT THYROID LOBECTOMY, REIMPLANTATION PARATHYROID;  Surgeon: Fanny Skates, MD;  [facility];  Service: General;  Laterality: N/A;  . VENTRAL HERNIA REPAIR  2/99   Social History:  reports that she has never smoked. She has never used smokeless tobacco. She reports that she does not drink alcohol or use drugs.   Family History  Problem Relation Age of Onset  . Cancer Mother        PANCREATIC   . Osteoporosis Mother   . Hip fracture Mother   . Heart attack Father 63       Died 68  . Heart disease Father   . Arthritis Father   . Hypertension Sister   . Cancer Sister        skin  . Hypertension Sister   . Breast cancer Neg Hx      Allergies  Allergen Reactions  . Actonel  [Risedronate] Other (See Comments)    Bone pain and nausea   . Lipitor [Atorvastatin Calcium] Other (See Comments)    myalgias  . Lyrica [Pregabalin] Other (See Comments)    SORES IN MOUTH   . Sibutramine Hcl Monohydrate Other (See Comments)    UNSPECIFIED REACTION   . Alendronate Sodium Nausea Only  . Latex Rash and Other (See Comments)    RA to latex 1982; includes bandaids    . Levofloxacin Nausea And Vomiting  . Meloxicam Other (See Comments)    Vaginal itching      Prior to Admission medications   Medication Sig Start Date End Date Taking? Authorizing Provider  aspirin 81 MG chewable tablet Chew 81 mg by mouth daily.    [provider]  clonazePAM (KLONOPIN) 0.25 MG disintegrating tablet TAKE 1 TABLET BY MOUTH 3 TIMES A DAY 12/27/17   Chipper Herb, MD  clorazepate (TRANXENE) 7.5 MG tablet TAKE 1 TABLET DAILY AS NEEDED 12/04/17   Chipper Herb, MD  furosemide (LASIX) 20 MG tablet Take 20 mg by mouth daily.     [provider]  guaiFENesin (ROBITUSSIN) 100 MG/5ML SOLN Take 5 mLs (100 mg total) by mouth 3 (three) times daily with meals. Patient taking differently: Take 5 mLs by mouth every 6 (six) hours as needed for cough.  09/01/17   Angiulli, Lavon Paganini, PA-C  HYDROcodone-acetaminophen (NORCO/VICODIN) 5-325 MG tablet Take 1-2 tablets by mouth every 6 (six) hours as needed for moderate pain. 12/01/17 12/01/18  Melida Quitter, MD  latanoprost (XALATAN) 0.005 % ophthalmic solution Place 1 drop into both eyes at bedtime. 09/01/17   Angiulli, Lavon Paganini, PA-C  levothyroxine (SYNTHROID, LEVOTHROID) 100 MCG tablet Take 100 mcg by mouth See admin instructions. Take one tablet by mouth every other day (alternates doses)    [provider]  levothyroxine (SYNTHROID, LEVOTHROID) 112 MCG tablet Take 112 mcg by mouth See admin instructions. Alternates doses    [provider]  loratadine (CLARITIN) 10 MG tablet Take 1 tablet (10 mg total) by mouth daily. 11/22/17    Chipper Herb, MD  Multiple Vitamin (MULTIVITAMIN WITH MINERALS) TABS tablet Take 1 tablet by mouth daily.    [provider]  potassium chloride (K-DUR,KLOR-CON) 10 MEQ tablet Take 1 tablet (10 mEq total) by mouth 2 (two) times daily. 12/27/17   Chipper Herb, MD  PROCTOZONE-HC 2.5 % rectal cream APPLY RECTALLY 2 TIMES A DAY AS NEEDED Patient  taking differently: APPLY RECTALLY 2 TIMES A DAY AS NEEDED FOR IRRITATION 11/21/17   Chipper Herb, MD  QUEtiapine (SEROQUEL) 25 MG tablet TAKE 3 TABLETS AT BEDTIME AS DIRECTED 12/04/17   Chipper Herb, MD  venlafaxine (EFFEXOR) 37.5 MG tablet Take 1 tablet (37.5 mg total) by mouth 2 (two) times daily. 12/27/17   Chipper Herb, MD    Review of Systems:  Constitutional:  No weight loss, night sweats, Fevers, chills, fatigue.  Head&Eyes: No headache.  No vision loss.  No eye pain or scotoma ENT:  No Difficulty swallowing,Tooth/dental problems,Sore throat,  No ear ache, post nasal drip,  Cardio-vascular:  No chest pain, Orthopnea, PND, swelling in lower extremities,  dizziness, palpitations  GI:  No  abdominal pain, nausea, vomiting, diarrhea, loss of appetite, hematochezia, melena, heartburn, indigestion, Resp:  No shortness of breath with exertion or at rest. No coughing up of blood .No wheezing.No chest wall deformity  Skin:  no rash or lesions.  GU:  no dysuria, change in color of urine, no urgency or frequency. No flank pain.  Musculoskeletal:  No joint pain or swelling. No decreased range of motion. No back pain.  Psych:  No change in mood or affect. No depression or anxiety. Neurologic: No headache, no dysesthesia, no focal weakness, no vision loss. No syncope  Physical Exam: Vitals:   12/27/17 1051  BP: (!) 144/79  Pulse: 92  Resp: 20  Temp: 98 F (36.7 C)  TempSrc: Oral  SpO2: 97%   General:  A&O x 3, NAD, nontoxic, pleasant/cooperative Head/Eye: No conjunctival hemorrhage, no icterus, [place]/AT, No nystagmus ENT:   No icterus,  No thrush, good dentition, no pharyngeal exudate Neck:  No masses, no lymphadenpathy, no bruits CV:  RRR, no rub, no gallop, no S3 Lung:  Diminished BS, butCTAB, good air movement, no wheeze, no rhonchi Abdomen: soft/NT, +BS, nondistended, no peritoneal signs Ext: No cyanosis, No rashes, No petechiae, No lymphangitis, No edema Neuro: CNII-XII intact, strength 4/5 in bilateral upper and lower extremities, no dysmetria  Labs on Admission:  Basic Metabolic Panel: Recent Labs  Lab 12/26/17 1604 12/27/17 1150  NA 142 141  K 2.4* 1.9*  CL 88* 90*  CO2 32* 36*  GLUCOSE 94 92  BUN 9 9  CREATININE 1.01* 0.81  CALCIUM 6.9* 6.4*  MG  --  1.1*   Liver Function Tests: Recent Labs  Lab 12/27/17 1150  AST 27  ALT 17  ALKPHOS 147*  BILITOT 1.1  PROT 6.7  ALBUMIN 3.6   No results for input(s): LIPASE, AMYLASE in the last 168 hours. No results for input(s): AMMONIA in the last 168 hours. CBC: Recent Labs  Lab 12/26/17 1604 12/27/17 1150  WBC 7.8 5.6  NEUTROABS 3.8 3.2  HGB 12.2 12.6  HCT 35.0 37.9  MCV 80 85.0  PLT 342 299   Coagulation Profile: No results for input(s): INR, PROTIME in the last 168 hours. Cardiac Enzymes: Recent Labs  Lab 12/27/17 1150  TROPONINI 0.03*   BNP: Invalid input(s): POCBNP CBG: No results for input(s): GLUCAP in the last 168 hours. Urine analysis:    Component Value Date/Time   COLORURINE STRAW (A) 07/06/2017 1540   APPEARANCEUR CLEAR 07/06/2017 1540   APPEARANCEUR Cloudy (A) 12/02/2016 1421   LABSPEC 1.004 (L) 07/06/2017 1540   PHURINE 6.0 07/06/2017 1540   GLUCOSEU NEGATIVE 07/06/2017 1540   HGBUR NEGATIVE 07/06/2017 [place] 07/06/2017 1540   BILIRUBINUR Negative 12/02/2016 1421   KETONESUR  NEGATIVE 07/06/2017 1540   PROTEINUR NEGATIVE 07/06/2017 1540   UROBILINOGEN negative 05/19/2015 1240   UROBILINOGEN 0.2 09/18/2009 1145   NITRITE NEGATIVE 07/06/2017 1540   LEUKOCYTESUR NEGATIVE 07/06/2017  1540   LEUKOCYTESUR 1+ (A) 12/02/2016 1421   Sepsis Labs: @LABRCNTIP (procalcitonin:4,lacticidven:4) )No results found for this or any previous visit (from the past 240 hour(s)).   Radiological Exams on Admission: Dg Chest 2 View  Result Date: 12/27/2017 CLINICAL DATA:  Shortness of breath. EXAM: CHEST - 2 VIEW COMPARISON:  Radiographs of September 13, 2017. FINDINGS: The heart size and mediastinal contours are within normal limits. Status post aortic valve replacement. Tracheostomy tube is in grossly good position. No pneumothorax or pleural effusion is noted. Both lungs are clear. The visualized skeletal structures are unremarkable. IMPRESSION: No active cardiopulmonary disease. Electronically Signed   By: Marijo Conception, M.D.   On: 12/27/2017 13:12    EKG: Independently reviewed. Sinus, RBBB    Time spent:60 minutes Code Status:  FULL Family Communication:  Spouse updated at bedside Disposition Plan: expect 1-2 day hospitalization Consults called: none DVT Prophylaxis: [place] Heparin   Orson Eva, DO  [facility] Pager [phone]  If 7PM-7AM, please contact night-coverage [email or web address] Password [email or web address] 12/27/2017, 2:26 PM

## 2017-12-27 NOTE — ED Triage Notes (Signed)
Comes in for low potassium, calcium, and chloride levels. States she took her K+ this morning. Has been having difficulty with swallowing, not a new problem, denies missing doses. States she began shaking last night.

## 2017-12-27 NOTE — ED Notes (Signed)
Pt notified need for urine sample. Will call when able to give sample.

## 2017-12-27 NOTE — Progress Notes (Signed)
Subjective:    Patient ID: [name], female    DOB: Mar 10, 1941, 77 y.o.   MRN: [id number]  HPI Pt here for one day follow up anxiety. She had an episode at 10 pm last evening that caused her hands and feet to tingle and draw in. She is very nervous and tearful today.  The patient comes in today with her feet and hands drawing and tingling sensation and stiffness in both hands.  She has had increased anxiety and all this started last night at about 10:00.  We did find out from blood work that was just drawn from yesterday that her potassium remains very low at 2.4 and her serum calcium is low.  Her blood pressure and vital signs this morning are stable but the blood pressure is elevated somewhat.  I feel that this reaction is most likely due to her hypo-kalemia and decreased calcium.  She is planning a real important trip for early next week.  She did also go see the dentist yesterday and took antibiotics prior to the procedure.    Patient Active Problem List   Diagnosis Date Noted  . Tracheostomy status ([place])   . Airway obstruction   . Dyspnea on exertion   . Stridor   . Acute diastolic congestive heart failure ([place])   . Dysphagia   . Anxiety state   . Debility 08/12/2017  . PAF (paroxysmal atrial fibrillation) ([place])   . Pressure injury of skin 08/04/2017  . Tracheostomy dependence ([place])   . Acute encephalopathy   . HCAP (healthcare-associated pneumonia)   . Acute respiratory failure ([place])   . RVF (right ventricular failure) ([place])   . RVAD (right ventricular assist device) present ([place])   . Cardiogenic shock ([place])   . S/P AVR 07/10/2017  . Nephrolithiasis 04/07/2017  . Vitamin B 12 deficiency 03/01/2016  . Ascending aortic aneurysm ([place]) 08/20/2015  . Thyroid cancer ([place]) 07/30/2015  . Left thyroid nodule 07/27/2015  . Osteoporosis with fracture 04/16/2014  . At high risk for falls 04/16/2014  . Proximal humerus fracture 05/30/2011  . CHEST PAIN, PRECORDIAL 02/26/2010  .  Hyperlipidemia 02/23/2010  . Depression 02/23/2010  . GLAUCOMA 02/23/2010  . CATARACTS 02/23/2010  . RHEUMATIC FEVER 02/23/2010  . Essential hypertension 02/23/2010  . MITRAL VALVE PROLAPSE 02/23/2010  . GERD 02/23/2010  . Osteoarthritis 02/23/2010  . Primary fibromyalgia syndrome 02/23/2010   Outpatient Encounter Medications as of 12/27/2017  Medication Sig  . aspirin 81 MG chewable tablet Chew 81 mg by mouth daily.  . clonazePAM (KLONOPIN) 0.25 MG disintegrating tablet Take 1 tablet (0.25 mg total) by mouth 3 (three) times daily. (Patient taking differently: Take 0.25-0.375 mg by mouth See admin instructions. Take 1 tablet every morning and afternoon then take 1 and 1/2 tablets at bedtime)  . clorazepate (TRANXENE) 7.5 MG tablet TAKE 1 TABLET DAILY AS NEEDED  . furosemide (LASIX) 20 MG tablet Take 20 mg by mouth daily.   Marland Kitchen guaiFENesin (ROBITUSSIN) 100 MG/5ML SOLN Take 5 mLs (100 mg total) by mouth 3 (three) times daily with meals. (Patient taking differently: Take 5 mLs by mouth every 6 (six) hours as needed for cough. )  . HYDROcodone-acetaminophen (NORCO/VICODIN) 5-325 MG tablet Take 1-2 tablets by mouth every 6 (six) hours as needed for moderate pain.  Marland Kitchen latanoprost (XALATAN) 0.005 % ophthalmic solution Place 1 drop into both eyes at bedtime.  Marland Kitchen levothyroxine (SYNTHROID, LEVOTHROID) 100 MCG tablet Take 100 mcg by mouth See admin instructions. Take one  tablet by mouth every other day (alternates doses)  . levothyroxine (SYNTHROID, LEVOTHROID) 112 MCG tablet Take 112 mcg by mouth See admin instructions. Alternates doses  . loratadine (CLARITIN) 10 MG tablet Take 1 tablet (10 mg total) by mouth daily.  . Multiple Vitamin (MULTIVITAMIN WITH MINERALS) TABS tablet Take 1 tablet by mouth daily.  . potassium chloride (K-DUR,KLOR-CON) 10 MEQ tablet Take 10 mEq by mouth 2 (two) times daily.  Marland Kitchen PROCTOZONE-HC 2.5 % rectal cream APPLY RECTALLY 2 TIMES A DAY AS NEEDED (Patient taking differently: APPLY  RECTALLY 2 TIMES A DAY AS NEEDED FOR IRRITATION)  . QUEtiapine (SEROQUEL) 25 MG tablet TAKE 3 TABLETS AT BEDTIME AS DIRECTED  . venlafaxine (EFFEXOR) 37.5 MG tablet Take 1 tablet (37.5 mg total) by mouth 2 (two) times daily.   Facility-Administered Encounter Medications as of 12/27/2017  Medication  . cyanocobalamin ((VITAMIN B-12)) injection 1,000 mcg      Review of Systems  Constitutional: Negative.        Hands and feet tingle and drew in last pm   HENT: Negative.   Eyes: Negative.   Respiratory: Negative.   Cardiovascular: Negative.   Gastrointestinal: Negative.   Endocrine: Negative.   Genitourinary: Negative.   Musculoskeletal: Negative.   Skin: Negative.   Allergic/Immunologic: Negative.   Neurological: Negative.   Hematological: Negative.   Psychiatric/Behavioral: The patient is nervous/anxious (tearful / ).        Objective:   Physical Exam  Constitutional: She is oriented to person, place, and time. She appears distressed.  Is anxious and tearful because of feeling so bad which started last night and she just had a visit yesterday and the blood work came back showing her potassium calcium and chloride were all low and that she has not been taking the potassium as she was supposed to been taking it.  HENT:  Head: Normocephalic and atraumatic.  Eyes: Pupils are equal, round, and reactive to light. Conjunctivae and EOM are normal. Right eye exhibits no discharge. Left eye exhibits no discharge.  Neck: Normal range of motion.  Cardiovascular: Normal rate and regular rhythm.  Pulmonary/Chest: Effort normal and breath sounds normal. She has no wheezes. She has no rales.  Musculoskeletal:  Patient comes in in a wheelchair this morning because of weakness from the hypokalemia and hypocalcemia.  Neurological: She is alert and oriented to person, place, and time.  Skin: Skin is warm and dry.  Nursing note and vitals reviewed.  BP (!) 159/83 (BP Location: Left Arm)   Pulse  94   Temp (!) 97.4 F (36.3 C) (Oral)   Ht 5\' 5"  (1.651 m)   Wt 145 lb (65.8 kg)   SpO2 97%   BMI 24.13 kg/m         Assessment & Plan:   1. Weakness -Most likely secondary to dehydration and hypokalemia and hypocalcemia.  2. Tremor -Most likely secondary to locate potassium and low calcium  3. Dehydration -Needs IV fluids  4. Hypokalemia -Needs potassium replacement  5. Hypocalcemia -Needs calcium replacement  Continue all meds Continue Lifestyle modification- diet and exercise Fall precautions discussed Keep follow up appointments with any specialists Go to the ER at any [facility] in [place] so they can give IV fluids Continue the potassium intake 10 mEq twice daily Uou should have a BMP done stat on Monday morning before you leave for your trip on Tuesday Be here before 10 AM in the morning    Arrie Senate MD

## 2017-12-27 NOTE — Patient Instructions (Signed)
We will arrange for you to have IV fluids in the emergency room with supplemental potassium and calcium. We would ask that you continue with the potassium 10 mEq twice a day instead of once a day you should have a BMP done stat on Monday morning before you leave for your trip on Tuesday

## 2017-12-27 NOTE — ED Notes (Signed)
CRITICAL VALUE ALERT  Critical Value:  Calcium 6.4, Trop 0.03, Potassium 1.9  Date & Time Notied:  12/27/17, 1330  Provider Notified: Dr. Gilford Raid  Orders Received/Actions taken: none at this time

## 2017-12-27 NOTE — ED Provider Notes (Addendum)
[facility] EMERGENCY DEPARTMENT Provider Note   CSN: [id number] Arrival date & time: 12/27/17  1041     History   Chief Complaint Chief Complaint  Patient presents with  . Abnormal Lab    HPI [name] is a 77 y.o. female.  Pt presents to the ED today with shaking and low potassium.  Pt developed severe shaking 2 nights ago around 10:00.  She felt very anxious.  The pt went to her pcp yesterday and blood work was done.  The pt's potassium was low.  Pt does not swallow well and only took 1 pill.  She went back today and did not feel any better, so was sent here.  She and her husband go to [place] every year for the past 30 years to look at horses.  She is supposed to leave on the 9th and is anxious to get better before that trip.     Past Medical History:  Diagnosis Date  . ACL tear    right  Dr. Gladstone Pih   . Adenomatous polyp   . Anxiety   . Arthritis   . Ascending aortic aneurysm ([place])    note per chart per Dr Lucianne Lei Tright 4.7 cm 04/15/2015   . B12 deficiency   . Cataracts, both eyes 10/2006  . Chronic bronchitis ([place])   . Depression   . DUB (dysfunctional uterine bleeding) 10/96  . Fibromyalgia   . GERD (gastroesophageal reflux disease)   . Glaucoma    bilaterally  . Helicobacter pylori (H. pylori)   . Hiatal hernia   . History of bronchitis   . History of kidney stones   . History of left shoulder fracture    pt states fell off bed and broke ball in shoulder had rod placed   . Hyperlipidemia   . Hypertension   . Hypothyroidism   . Laryngeal stenosis   . Occasional tremors    left arm   . Osteoarthritis   . Osteopenia   . Osteoporosis   . Thyroid cancer ([place])   . Thyroid nodule   . Tracheostomy in place [facility])     Patient Active Problem List   Diagnosis Date Noted  . Tracheostomy status ([place])   . Airway obstruction   . Dyspnea on exertion   . Stridor   . Acute diastolic congestive heart failure ([place])   . Dysphagia   . Anxiety state   . Debility  08/12/2017  . PAF (paroxysmal atrial fibrillation) ([place])   . Pressure injury of skin 08/04/2017  . Tracheostomy dependence ([place])   . Acute encephalopathy   . HCAP (healthcare-associated pneumonia)   . Acute respiratory failure ([place])   . RVF (right ventricular failure) ([place])   . RVAD (right ventricular assist device) present ([place])   . Cardiogenic shock ([place])   . S/P AVR 07/10/2017  . Nephrolithiasis 04/07/2017  . Vitamin B 12 deficiency 03/01/2016  . Ascending aortic aneurysm ([place]) 08/20/2015  . Thyroid cancer ([place]) 07/30/2015  . Left thyroid nodule 07/27/2015  . Osteoporosis with fracture 04/16/2014  . At high risk for falls 04/16/2014  . Proximal humerus fracture 05/30/2011  . CHEST PAIN, PRECORDIAL 02/26/2010  . Hyperlipidemia 02/23/2010  . Depression 02/23/2010  . GLAUCOMA 02/23/2010  . CATARACTS 02/23/2010  . RHEUMATIC FEVER 02/23/2010  . Essential hypertension 02/23/2010  . MITRAL VALVE PROLAPSE 02/23/2010  . GERD 02/23/2010  . Osteoarthritis 02/23/2010  . Primary fibromyalgia syndrome 02/23/2010    Past Surgical History:  Procedure Laterality Date  .  AORTIC VALVE REPLACEMENT N/A 07/10/2017   Procedure: AORTIC VALVE REPLACEMENT (AVR);  Surgeon: [name], Collier Salina, MD;  Location: [place];  Service: Open Heart Surgery;  Laterality: N/A;  . APPENDECTOMY    . BUNIONECTOMY  08/1999   right - Dr. Irving Shows   . CHOLECYSTECTOMY  1979  . DILATION AND CURETTAGE OF UTERUS  03/31/95   Dr. Ovid Curd   . DIRECT LARYNGOSCOPY WITH BOTOX INJECTION N/A 12/01/2017   Procedure: SUSPENDED DIRECT MICROLARYNGOSCOPY WITH KENALOG INJECTION, CO2 LASER;  Surgeon: Melida Quitter, MD;  Location: [place];  Service: ENT;  Laterality: N/A;  . EXPLORATION POST OPERATIVE OPEN HEART N/A 07/11/2017   Procedure: EXPLORATION POST OPERATIVE OPEN HEART;  Surgeon: Ivin Poot, MD;  Location: [place];  Service: Open Heart Surgery;  Laterality: N/A;  . EYE SURGERY     also had left cataract removed   . FRACTURE SURGERY      . HEMATOMA EVACUATION N/A 07/11/2017   Procedure: EVACUATION HEMATOMA;  Surgeon: Ivin Poot, MD;  Location: [place];  Service: Open Heart Surgery;  Laterality: N/A;  . HERNIA REPAIR    . NEPHROLITHOTOMY Left 04/07/2017   Procedure: NEPHROLITHOTOMY PERCUTANEOUS WITH SURGEON ACCESS;  Surgeon: Ardis Hughs, MD;  [facility];  Service: Urology;  Laterality: Left;  . ORIF HUMERUS FRACTURE  05/30/2011   Procedure: OPEN REDUCTION INTERNAL FIXATION (ORIF) PROXIMAL HUMERUS FRACTURE;  Surgeon: Augustin Schooling;  Location: [place];  Service: Orthopedics;  Laterality: Left;  open reduction internal fixation of proximal humerus fracture  . PLACEMENT OF CENTRIMAG VENTRICULAR ASSIST DEVICE Right 07/11/2017   Procedure: PLACEMENT OF CENTRIMAG VENTRICULAR ASSIST DEVICE;  Surgeon: Ivin Poot, MD;  Location: [place];  Service: Open Heart Surgery;  Laterality: Right;  . REMOVAL OF CENTRIMAG VENTRICULAR ASSIST DEVICE N/A 07/17/2017   Procedure: REMOVAL OF RVAD WITH PUMP STANDBY;  Surgeon: Ivin Poot, MD;  Location: [place];  Service: Open Heart Surgery;  Laterality: N/A;  . REPLACEMENT ASCENDING AORTA N/A 07/10/2017   Procedure: REPLACEMENT ASCENDING AORTA;  Surgeon: Ivin Poot, MD;  Location: [place];  Service: Open Heart Surgery;  Laterality: N/A;  . right knee replacement  3/11   Dr. Gladstone Pih  . RIGHT/LEFT HEART CATH AND CORONARY ANGIOGRAPHY N/A 06/13/2017   Procedure: RIGHT/LEFT HEART CATH AND CORONARY ANGIOGRAPHY;  Surgeon: Jolaine Artist, MD;  Location: [place] [facility];  Service: Cardiovascular;  Laterality: N/A;  . rt. eye cataract  05/28/07  . TEE WITHOUT CARDIOVERSION N/A 07/10/2017   Procedure: TRANSESOPHAGEAL ECHOCARDIOGRAM (TEE);  Surgeon: [name], Collier Salina, MD;  Location: [place];  Service: Open Heart Surgery;  Laterality: N/A;  . TEE WITHOUT CARDIOVERSION  07/11/2017   Procedure: TRANSESOPHAGEAL ECHOCARDIOGRAM (TEE);  Surgeon: [name], Collier Salina, MD;  Location: [place];  Service:  Open Heart Surgery;;  . TEE WITHOUT CARDIOVERSION N/A 07/17/2017   Procedure: TRANSESOPHAGEAL ECHOCARDIOGRAM (TEE);  Surgeon: [name], Collier Salina, MD;  Location: [place];  Service: Open Heart Surgery;  Laterality: N/A;  . THYROID LOBECTOMY Left 07/27/2015   Procedure: LEFT THYROID LOBECTOMY;  Surgeon: Fanny Skates, MD;  [facility];  Service: General;  Laterality: Left;  . THYROIDECTOMY N/A 08/06/2015   Procedure: RIGHT THYROID LOBECTOMY, REIMPLANTATION PARATHYROID;  Surgeon: Fanny Skates, MD;  [facility];  Service: General;  Laterality: N/A;  . VENTRAL HERNIA REPAIR  2/99     OB History   None      Home Medications    Prior to Admission medications  Medication Sig Start Date End Date Taking? Authorizing Provider  aspirin 81 MG chewable tablet Chew 81 mg by mouth daily.    [provider]  clonazePAM (KLONOPIN) 0.25 MG disintegrating tablet TAKE 1 TABLET BY MOUTH 3 TIMES A DAY 12/27/17   Chipper Herb, MD  clorazepate (TRANXENE) 7.5 MG tablet TAKE 1 TABLET DAILY AS NEEDED 12/04/17   Chipper Herb, MD  furosemide (LASIX) 20 MG tablet Take 20 mg by mouth daily.     [provider]  guaiFENesin (ROBITUSSIN) 100 MG/5ML SOLN Take 5 mLs (100 mg total) by mouth 3 (three) times daily with meals. Patient taking differently: Take 5 mLs by mouth every 6 (six) hours as needed for cough.  09/01/17   Angiulli, Lavon Paganini, PA-C  HYDROcodone-acetaminophen (NORCO/VICODIN) 5-325 MG tablet Take 1-2 tablets by mouth every 6 (six) hours as needed for moderate pain. 12/01/17 12/01/18  Melida Quitter, MD  latanoprost (XALATAN) 0.005 % ophthalmic solution Place 1 drop into both eyes at bedtime. 09/01/17   Angiulli, Lavon Paganini, PA-C  levothyroxine (SYNTHROID, LEVOTHROID) 100 MCG tablet Take 100 mcg by mouth See admin instructions. Take one tablet by mouth every other day (alternates doses)    [provider]  levothyroxine (SYNTHROID, LEVOTHROID) 112 MCG tablet Take 112 mcg by mouth See  admin instructions. Alternates doses    [provider]  loratadine (CLARITIN) 10 MG tablet Take 1 tablet (10 mg total) by mouth daily. 11/22/17   Chipper Herb, MD  Multiple Vitamin (MULTIVITAMIN WITH MINERALS) TABS tablet Take 1 tablet by mouth daily.    [provider]  potassium chloride (K-DUR,KLOR-CON) 10 MEQ tablet Take 1 tablet (10 mEq total) by mouth 2 (two) times daily. 12/27/17   Chipper Herb, MD  PROCTOZONE-HC 2.5 % rectal cream APPLY RECTALLY 2 TIMES A DAY AS NEEDED Patient taking differently: APPLY RECTALLY 2 TIMES A DAY AS NEEDED FOR IRRITATION 11/21/17   Chipper Herb, MD  QUEtiapine (SEROQUEL) 25 MG tablet TAKE 3 TABLETS AT BEDTIME AS DIRECTED 12/04/17   Chipper Herb, MD  venlafaxine (EFFEXOR) 37.5 MG tablet Take 1 tablet (37.5 mg total) by mouth 2 (two) times daily. 12/27/17   Chipper Herb, MD    Family History Family History  Problem Relation Age of Onset  . Cancer Mother        PANCREATIC   . Osteoporosis Mother   . Hip fracture Mother   . Heart attack Father 63       Died 68  . Heart disease Father   . Arthritis Father   . Hypertension Sister   . Cancer Sister        skin  . Hypertension Sister   . Breast cancer Neg Hx     Social History Social History   Tobacco Use  . Smoking status: Never Smoker  . Smokeless tobacco: Never Used  Substance Use Topics  . Alcohol use: No  . Drug use: No     Allergies   Actonel [risedronate]; Lipitor [atorvastatin calcium]; Lyrica [pregabalin]; Sibutramine hcl monohydrate; Alendronate sodium; Latex; Levofloxacin; and Meloxicam   Review of Systems Review of Systems  Constitutional: Positive for chills.  Psychiatric/Behavioral: The patient is nervous/anxious.   All other systems reviewed and are negative.    Physical Exam Updated Vital Signs BP (!) 144/79 (BP Location: Right Arm)   Pulse 92   Temp 98 F (36.7 C) (Oral)   Resp 20   SpO2 97%   Physical Exam  Constitutional: She is  oriented to person, place, and time. She appears well-developed and well-nourished.  HENT:  Head: Normocephalic and atraumatic.  Right Ear: External ear normal.  Left Ear: External ear normal.  Nose: Nose normal.  Mouth/Throat: Oropharynx is clear and moist.  Eyes: Pupils are equal, round, and reactive to light. Conjunctivae and EOM are normal.  Neck: Normal range of motion.  Trach in place (complications from thoracic aortic aneurysm repair earlier this year)  Cardiovascular: Normal rate, regular rhythm, normal heart sounds and intact distal pulses.  Pulmonary/Chest: Effort normal and breath sounds normal.  Abdominal: Soft. Bowel sounds are normal.  Musculoskeletal: Normal range of motion.  Neurological: She is alert and oriented to person, place, and time.  Skin: Skin is warm. Capillary refill takes less than 2 seconds.  Psychiatric: Her mood appears anxious.  Nursing note and vitals reviewed.    ED Treatments / Results  Labs (all labs ordered are listed, but only abnormal results are displayed) Labs Reviewed  COMPREHENSIVE METABOLIC PANEL - Abnormal; Notable for the following components:      Result Value   Potassium 1.9 (*)    Chloride 90 (*)    CO2 36 (*)    Calcium 6.4 (*)    Alkaline Phosphatase 147 (*)    All other components within normal limits  TROPONIN I - Abnormal; Notable for the following components:   Troponin I 0.03 (*)    All other components within normal limits  MAGNESIUM - Abnormal; Notable for the following components:   Magnesium 1.1 (*)    All other components within normal limits  CBC WITH DIFFERENTIAL/PLATELET  URINALYSIS, ROUTINE W REFLEX MICROSCOPIC    EKG EKG Interpretation  Date/Time:  Wednesday December 27 2017 11:33:54 EDT Ventricular Rate:  78 PR Interval:    QRS Duration: 148 QT Interval:  472 QTC Calculation: 538 R Axis:   -59 Text Interpretation:  Ectopic atrial rhythm Borderline short PR interval Right bundle branch block Inferior  infarct, old Probable anterolateral infarct, age indeterm new from last ekg Confirmed by Isla Pence ([id number]) on 12/27/2017 12:35:43 PM   Radiology Dg Chest 2 View  Result Date: 12/27/2017 CLINICAL DATA:  Shortness of breath. EXAM: CHEST - 2 VIEW COMPARISON:  Radiographs of September 13, 2017. FINDINGS: The heart size and mediastinal contours are within normal limits. Status post aortic valve replacement. Tracheostomy tube is in grossly good position. No pneumothorax or pleural effusion is noted. Both lungs are clear. The visualized skeletal structures are unremarkable. IMPRESSION: No active cardiopulmonary disease. Electronically Signed   By: Marijo Conception, M.D.   On: 12/27/2017 13:12    Procedures Procedures (including critical care time)  Medications Ordered in ED Medications  sodium chloride 0.9 % bolus 1,000 mL (has no administration in time range)  magnesium sulfate IVPB 2 g 50 mL (has no administration in time range)  potassium chloride 10 mEq in 100 mL IVPB (has no administration in time range)  potassium chloride SA (K-DUR,KLOR-CON) CR tablet 40 mEq (has no administration in time range)  calcium gluconate 1 g in sodium chloride 0.9 % 100 mL IVPB (has no administration in time range)  LORazepam (ATIVAN) injection 0.5 mg (0.5 mg Intravenous Given 12/27/17 1139)     Initial Impression / Assessment and Plan / ED Course  I have reviewed the triage vital signs and the nursing notes.  Pertinent labs & imaging results that were available during my care of the patient were reviewed by me and considered  in my medical decision making (see chart for details).    CRITICAL CARE Performed by: Isla Pence   Total critical care time: 30 minutes  Critical care time was exclusive of separately billable procedures and treating other patients.  Critical care was necessary to treat or prevent imminent or life-threatening deterioration.  Critical care was time spent personally by me on the  following activities: development of treatment plan with patient and/or surrogate as well as nursing, discussions with consultants, evaluation of patient's response to treatment, examination of patient, obtaining history from patient or surrogate, ordering and performing treatments and interventions, ordering and review of laboratory studies, ordering and review of radiographic studies, pulse oximetry and re-evaluation of patient's condition.  Potassium, magnesium, and calcium are replaced.  Reasons for them being so low are as yet unclear.  ? Lasix.  Pt d/w Dr. Carles Collet ([facility]) who will admit.  Final Clinical Impressions(s) / ED Diagnoses   Final diagnoses:  Hypokalemia  Hypomagnesemia  Hypocalcemia    ED Discharge Orders    None       Isla Pence, MD 12/27/17 1356    Isla Pence, MD 01/09/18 Kathyrn Drown

## 2017-12-28 DIAGNOSIS — E876 Hypokalemia: Secondary | ICD-10-CM | POA: Diagnosis not present

## 2017-12-28 DIAGNOSIS — R209 Unspecified disturbances of skin sensation: Secondary | ICD-10-CM | POA: Diagnosis not present

## 2017-12-28 LAB — COMPREHENSIVE METABOLIC PANEL
ALT: 16 U/L (ref 0–44)
AST: 26 U/L (ref 15–41)
Albumin: 3.1 g/dL — ABNORMAL LOW (ref 3.5–5.0)
Alkaline Phosphatase: 127 U/L — ABNORMAL HIGH (ref 38–126)
Anion gap: 10 (ref 5–15)
BUN: 6 mg/dL — AB (ref 8–23)
CHLORIDE: 101 mmol/L (ref 98–111)
CO2: 33 mmol/L — ABNORMAL HIGH (ref 22–32)
Calcium: 6.4 mg/dL — CL (ref 8.9–10.3)
Creatinine, Ser: 0.82 mg/dL (ref 0.44–1.00)
Glucose, Bld: 118 mg/dL — ABNORMAL HIGH (ref 70–99)
POTASSIUM: 2.9 mmol/L — AB (ref 3.5–5.1)
Sodium: 144 mmol/L (ref 135–145)
Total Bilirubin: 0.8 mg/dL (ref 0.3–1.2)
Total Protein: 5.8 g/dL — ABNORMAL LOW (ref 6.5–8.1)

## 2017-12-28 LAB — BASIC METABOLIC PANEL
ANION GAP: 8 (ref 5–15)
BUN: 5 mg/dL — ABNORMAL LOW (ref 8–23)
CHLORIDE: 100 mmol/L (ref 98–111)
CO2: 33 mmol/L — ABNORMAL HIGH (ref 22–32)
Calcium: 7.4 mg/dL — ABNORMAL LOW (ref 8.9–10.3)
Creatinine, Ser: 0.82 mg/dL (ref 0.44–1.00)
GFR calc non Af Amer: >60 mL/min (ref >60)
Glucose, Bld: 118 mg/dL — ABNORMAL HIGH (ref 70–99)
POTASSIUM: 3.9 mmol/L (ref 3.5–5.1)
SODIUM: 141 mmol/L (ref 135–145)

## 2017-12-28 LAB — MAGNESIUM: MAGNESIUM: 1.9 mg/dL (ref 1.7–2.4)

## 2017-12-28 LAB — VITAMIN D 25 HYDROXY (VIT D DEFICIENCY, FRACTURES): VIT D 25 HYDROXY: 57.1 ng/mL (ref 30.0–100.0)

## 2017-12-28 MED ORDER — MAGNESIUM OXIDE 400 (241.3 MG) MG PO TABS
400.0000 mg | ORAL_TABLET | Freq: Every day | ORAL | Status: DC
  Administered 2017-12-28: 400 mg via ORAL
  Filled 2017-12-28: qty 1

## 2017-12-28 MED ORDER — POTASSIUM CHLORIDE CRYS ER 20 MEQ PO TBCR
40.0000 meq | EXTENDED_RELEASE_TABLET | ORAL | Status: AC
  Administered 2017-12-28 (×2): 40 meq via ORAL
  Filled 2017-12-28: qty 4
  Filled 2017-12-28: qty 2

## 2017-12-28 MED ORDER — MAGNESIUM OXIDE 400 (241.3 MG) MG PO TABS: 400.0000 mg | ORAL_TABLET | Freq: Every day | ORAL | 0 refills | Status: AC

## 2017-12-28 MED ORDER — CALCIUM CARBONATE 1250 (500 CA) MG PO TABS: 1.0000 | ORAL_TABLET | Freq: Every day | ORAL | 0 refills | Status: AC

## 2017-12-28 MED ORDER — CALCIUM GLUCONATE 10 % IV SOLN
2.0000 g | Freq: Once | INTRAVENOUS | Status: AC
  Administered 2017-12-28: 2 g via INTRAVENOUS
  Filled 2017-12-28: qty 20

## 2017-12-28 MED ORDER — POTASSIUM CHLORIDE CRYS ER 20 MEQ PO TBCR: 40.0000 meq | EXTENDED_RELEASE_TABLET | Freq: Four times a day (QID) | ORAL | Status: DC

## 2017-12-28 MED ORDER — POTASSIUM CHLORIDE 10 MEQ/100ML IV SOLN
10.0000 meq | INTRAVENOUS | Status: AC
  Administered 2017-12-28 (×3): 10 meq via INTRAVENOUS
  Filled 2017-12-28 (×3): qty 100

## 2017-12-28 MED ORDER — CALCIUM CARBONATE ANTACID 500 MG PO CHEW
400.0000 mg | CHEWABLE_TABLET | Freq: Two times a day (BID) | ORAL | Status: DC
  Administered 2017-12-28: 400 mg via ORAL
  Filled 2017-12-28: qty 2

## 2017-12-28 MED ORDER — MAGNESIUM SULFATE 2 GM/50ML IV SOLN
2.0000 g | Freq: Once | INTRAVENOUS | Status: AC
  Administered 2017-12-28: 2 g via INTRAVENOUS
  Filled 2017-12-28: qty 50

## 2017-12-28 MED ORDER — POTASSIUM CHLORIDE CRYS ER 10 MEQ PO TBCR: 20.0000 meq | EXTENDED_RELEASE_TABLET | Freq: Two times a day (BID) | ORAL | 1 refills | Status: DC

## 2017-12-28 NOTE — Discharge Summary (Addendum)
Physician Discharge Summary  [name] LXB:[id number] DOB: 09/07/1940 DOA: 12/27/2017  PCP: Chipper Herb, MD  Admit date: 12/27/2017 Discharge date: 12/28/2017  Admitted From: Home Disposition:  Home  Recommendations for Outpatient Follow-up:  1. Follow up with PCP in 1-2 weeks 2. Please obtain BMP and mag on 01/01/18    Discharge Condition: Stable CODE STATUS: FULL Diet recommendation: Heart Healthy   Brief/Interim Summary:  77 y.o. female with medical history of B12 deficiency, ascending aortic aneurysm, anxiety, hypothyroidism presents with numbness and tingling in the bilateral hands, and bilateral feet.  The patient had a routine visit at her primary care provider's office to adjust her medications for her anxiety and depression.  Routine blood work at that time showed a potassium of 2.4.  The patient was instructed to take her home potassium twice daily as prescribed previously.  She was instructed to return to the clinic for recheck of her potassium on 12/27/2017.  The patient states that she had only been taking her potassium once daily rather than twice daily as prescribed because she was having difficulty with the size of the pill.  Repeat blood work on 12/27/2017 showed a potassium 1.9 with magnesium 1.1.  As result, the patient was directed to the emergency department for further evaluation and treatment.  She denies any new medications, nausea, vomiting.  However, the patient states that her appetite has been poor and she has had decreased po intake.  She also stated she had loose stools after using an enema approximately 3 days prior to admission.    Discharge Diagnoses:  Dysesthesias -Likely secondary to hypomagnesemia and hypokalemia -Replete magnesium and potassium -Dysesthesias improved with supplementation  Tremors -Likely secondary to low potassium and low calcium  Hypokalemia -Likely secondary to poor oral intake -Aggressively replete -increase home dose to  55mEq bid -K = 3.9 at time of d/c  Hypocalcemia -Give calcium gluconate -Check 25-vitamin D level--57.1 -start oscal 500 mg elemental calcium daily after d/c -corrected calcium 7.8 at time of d/c  Hypomagnesemia -Give additional 2 g of magnesium -start mag ox 400 mg daily after d/c   Tracheostomy status/tracheal stenosis -12/01/2017--laryngoscopy with CO2 laser excision and Kenalog injection--Dr. Redmond Baseman -Repeat laryngoscopy planned 01/10/2018  Status post bioprosthetic aortic valve -Remains hemodynamically stable -prolonged hospital stay after AAA repair and aortic valve repair the course was complicated by right ventricular failure requiring right ventricular assist device and prolonged mechanical ventilation and ultimately tracheostomy after failed extubation attempt. She was discharged from rehabilitation at the end of March 2019       Discharge Instructions   Allergies as of 12/28/2017      Reactions   Actonel [risedronate] Other (See Comments)   Bone pain and nausea   Lipitor [atorvastatin Calcium] Other (See Comments)   myalgias   Lyrica [pregabalin] Other (See Comments)   SORES IN MOUTH    Sibutramine Hcl Monohydrate Other (See Comments)   UNSPECIFIED REACTION    Alendronate Sodium Nausea Only   Latex Rash, Other (See Comments)   RA to latex 1982; includes bandaids    Levofloxacin Nausea And Vomiting   Meloxicam Other (See Comments)   Vaginal itching       Medication List    TAKE these medications   amoxicillin 500 MG capsule Commonly known as:  AMOXIL Take 4 capsules by mouth as directed. Patient takes 4 capsules at once prior to dental appointments   aspirin 81 MG chewable tablet Chew 81 mg by mouth daily.  calcium carbonate 1250 (500 Ca) MG tablet Commonly known as:  OS-CAL - dosed in mg of elemental calcium Take 1 tablet (500 mg of elemental calcium total) by mouth daily with breakfast.   clonazePAM 0.25 MG disintegrating tablet Commonly known  as:  KLONOPIN TAKE 1 TABLET BY MOUTH 3 TIMES A DAY What changed:    how much to take  when to take this  additional instructions   clorazepate 7.5 MG tablet Commonly known as:  TRANXENE TAKE 1 TABLET DAILY AS NEEDED   furosemide 20 MG tablet Commonly known as:  LASIX Take 20 mg by mouth daily.   guaiFENesin 100 MG/5ML Soln Commonly known as:  ROBITUSSIN Take 5 mLs (100 mg total) by mouth 3 (three) times daily with meals. What changed:    when to take this  reasons to take this   HYDROcodone-acetaminophen 5-325 MG tablet Commonly known as:  NORCO/VICODIN Take 1-2 tablets by mouth every 6 (six) hours as needed for moderate pain.   latanoprost 0.005 % ophthalmic solution Commonly known as:  XALATAN Place 1 drop into both eyes at bedtime.   levothyroxine 112 MCG tablet Commonly known as:  SYNTHROID, LEVOTHROID Take 112 mcg by mouth every other day. Alternates doses   levothyroxine 100 MCG tablet Commonly known as:  SYNTHROID, LEVOTHROID Take 100 mcg by mouth See admin instructions. Take one tablet by mouth every other day (alternates doses)   loratadine 10 MG tablet Commonly known as:  CLARITIN Take 1 tablet (10 mg total) by mouth daily.   magnesium oxide 400 (241.3 Mg) MG tablet Commonly known as:  MAG-OX Take 1 tablet (400 mg total) by mouth daily.   multivitamin with minerals Tabs tablet Take 1 tablet by mouth daily.   potassium chloride 10 MEQ tablet Commonly known as:  K-DUR,KLOR-CON Take 2 tablets (20 mEq total) by mouth 2 (two) times daily. What changed:  how much to take   PROCTOZONE-HC 2.5 % rectal cream Generic drug:  hydrocortisone APPLY RECTALLY 2 TIMES A DAY AS NEEDED What changed:  See the new instructions.   QUEtiapine 25 MG tablet Commonly known as:  SEROQUEL TAKE 3 TABLETS AT BEDTIME AS DIRECTED   venlafaxine 37.5 MG tablet Commonly known as:  EFFEXOR Take 1 tablet (37.5 mg total) by mouth 2 (two) times daily.       Allergies    Allergen Reactions  . Actonel [Risedronate] Other (See Comments)    Bone pain and nausea   . Lipitor [Atorvastatin Calcium] Other (See Comments)    myalgias  . Lyrica [Pregabalin] Other (See Comments)    SORES IN MOUTH   . Sibutramine Hcl Monohydrate Other (See Comments)    UNSPECIFIED REACTION   . Alendronate Sodium Nausea Only  . Latex Rash and Other (See Comments)    RA to latex 1982; includes bandaids    . Levofloxacin Nausea And Vomiting  . Meloxicam Other (See Comments)    Vaginal itching     Consultations:  none   Procedures/Studies: Dg Chest 2 View  Result Date: 12/27/2017 CLINICAL DATA:  Shortness of breath. EXAM: CHEST - 2 VIEW COMPARISON:  Radiographs of September 13, 2017. FINDINGS: The heart size and mediastinal contours are within normal limits. Status post aortic valve replacement. Tracheostomy tube is in grossly good position. No pneumothorax or pleural effusion is noted. Both lungs are clear. The visualized skeletal structures are unremarkable. IMPRESSION: No active cardiopulmonary disease. Electronically Signed   By: Marijo Conception, M.D.   On: 12/27/2017  13:12        Discharge Exam: Vitals:   12/28/17 0440 12/28/17 0759  BP: (!) 115/57   Pulse: 81 80  Resp:    Temp: (!) 97.5 F (36.4 C)   SpO2: 100% 98%   Vitals:   12/27/17 1844 12/27/17 2122 12/28/17 0440 12/28/17 0759  BP:  (!) 114/97 (!) 115/57   Pulse: 81 79 81 80  Resp: 16     Temp:  98.2 F (36.8 C) (!) 97.5 F (36.4 C)   TempSrc:  Oral Oral   SpO2: 100% 99% 100% 98%  Weight:      Height:        General: Pt is alert, awake, not in acute distress Cardiovascular: RRR, S1/S2 +, no rubs, no gallops Respiratory: CTA bilaterally, no wheezing, no rhonchi Abdominal: Soft, NT, ND, bowel sounds + Extremities: no edema, no cyanosis   The results of significant diagnostics from this hospitalization (including imaging, microbiology, ancillary and laboratory) are listed below for reference.     Significant Diagnostic Studies: Dg Chest 2 View  Result Date: 12/27/2017 CLINICAL DATA:  Shortness of breath. EXAM: CHEST - 2 VIEW COMPARISON:  Radiographs of September 13, 2017. FINDINGS: The heart size and mediastinal contours are within normal limits. Status post aortic valve replacement. Tracheostomy tube is in grossly good position. No pneumothorax or pleural effusion is noted. Both lungs are clear. The visualized skeletal structures are unremarkable. IMPRESSION: No active cardiopulmonary disease. Electronically Signed   By: Marijo Conception, M.D.   On: 12/27/2017 13:12     Microbiology: No results found for this or any previous visit (from the past 240 hour(s)).   Labs: Basic Metabolic Panel: Recent Labs  Lab 12/26/17 1604 12/27/17 1150 12/28/17 0440 12/28/17 1318  NA 142 141 144 141  K 2.4* 1.9* 2.9* 3.9  CL 88* 90* 101 100  CO2 32* 36* 33* 33*  GLUCOSE 94 92 118* 118*  BUN 9 9 6* 5*  CREATININE 1.01* 0.81 0.82 0.82  CALCIUM 6.9* 6.4* 6.4* 7.4*  MG  --  1.1* 1.9  --    Liver Function Tests: Recent Labs  Lab 12/27/17 1150 12/28/17 0440  AST 27 26  ALT 17 16  ALKPHOS 147* 127*  BILITOT 1.1 0.8  PROT 6.7 5.8*  ALBUMIN 3.6 3.1*   No results for input(s): LIPASE, AMYLASE in the last 168 hours. No results for input(s): AMMONIA in the last 168 hours. CBC: Recent Labs  Lab 12/26/17 1604 12/27/17 1150  WBC 7.8 5.6  NEUTROABS 3.8 3.2  HGB 12.2 12.6  HCT 35.0 37.9  MCV 80 85.0  PLT 342 299   Cardiac Enzymes: Recent Labs  Lab 12/27/17 1150  TROPONINI 0.03*   BNP: Invalid input(s): POCBNP CBG: No results for input(s): GLUCAP in the last 168 hours.  Time coordinating discharge:  36 minutes  Signed:  Orson Eva, DO [facility] Pager: [phone] 12/28/2017, 2:41 PM

## 2017-12-28 NOTE — Care Management Obs Status (Signed)
[place] NOTIFICATION   Patient Details  Name: [name] MRN: [id number] Date of Birth: [birth year]   Medicare Observation Status Notification Given:  Yes    Sherald Barge, RN 12/28/2017, 10:51 AM

## 2017-12-28 NOTE — Discharge Instructions (Signed)
1. Follow up with PCP in 1-2 weeks 2. Please obtain BMP and mag on 01/01/18

## 2017-12-28 NOTE — Progress Notes (Signed)
Discharge instructions gone over with patient and spouse, verbalized understanding. IV removed by NT, patient tolerated procedure well.

## 2017-12-28 NOTE — Progress Notes (Signed)
CRITICAL VALUE ALERT  Critical Value:  Calcium 6.4  Date & Time Notied:  12/28/17 0608  Provider Notified: Dr. Olevia Bowens  Orders Received/Actions taken:

## 2017-12-29 ENCOUNTER — Other Ambulatory Visit: Payer: Self-pay | Admitting: *Deleted

## 2017-12-29 DIAGNOSIS — R899 Unspecified abnormal finding in specimens from other organs, systems and tissues: Secondary | ICD-10-CM

## 2018-01-01 ENCOUNTER — Telehealth: Payer: Self-pay | Admitting: Family Medicine

## 2018-01-01 ENCOUNTER — Other Ambulatory Visit: Payer: Medicare Other

## 2018-01-01 DIAGNOSIS — Z930 Tracheostomy status: Secondary | ICD-10-CM | POA: Diagnosis not present

## 2018-01-01 DIAGNOSIS — R5381 Other malaise: Secondary | ICD-10-CM | POA: Diagnosis not present

## 2018-01-01 DIAGNOSIS — R899 Unspecified abnormal finding in specimens from other organs, systems and tissues: Secondary | ICD-10-CM

## 2018-01-01 DIAGNOSIS — E876 Hypokalemia: Secondary | ICD-10-CM

## 2018-01-01 LAB — BMP8+EGFR
BUN/Creatinine Ratio: 6 — ABNORMAL LOW (ref 12–28)
BUN: 5 mg/dL — ABNORMAL LOW (ref 8–27)
CALCIUM: 8.6 mg/dL — AB (ref 8.7–10.3)
CO2: 31 mmol/L — AB (ref 20–29)
Chloride: 103 mmol/L (ref 96–106)
Creatinine, Ser: 0.9 mg/dL (ref 0.57–1.00)
GFR calc Af Amer: 72 mL/min/{1.73_m2} (ref >59)
GFR calc non Af Amer: 63 mL/min/{1.73_m2} (ref >59)
GLUCOSE: 122 mg/dL — AB (ref 65–99)
POTASSIUM: 4.2 mmol/L (ref 3.5–5.2)
SODIUM: 143 mmol/L (ref 134–144)

## 2018-01-01 LAB — MAGNESIUM: Magnesium: 1.8 mg/dL (ref 1.6–2.3)

## 2018-01-01 NOTE — Telephone Encounter (Signed)
Left message that lab work was stable and to return my call @ [phone]

## 2018-01-01 NOTE — Telephone Encounter (Signed)
Labs were reviewed.  Her hypokalemia has resolved.  Magnesium also within normal limits.  Renal function appears to be stabilized.  No interventions needed.

## 2018-01-02 ENCOUNTER — Telehealth: Payer: Self-pay | Admitting: *Deleted

## 2018-01-02 DIAGNOSIS — R5381 Other malaise: Secondary | ICD-10-CM | POA: Diagnosis not present

## 2018-01-02 DIAGNOSIS — Z930 Tracheostomy status: Secondary | ICD-10-CM | POA: Diagnosis not present

## 2018-01-02 NOTE — Telephone Encounter (Signed)
done

## 2018-01-10 ENCOUNTER — Encounter: Payer: Self-pay | Admitting: *Deleted

## 2018-01-10 DIAGNOSIS — Z930 Tracheostomy status: Secondary | ICD-10-CM | POA: Diagnosis not present

## 2018-01-10 DIAGNOSIS — J386 Stenosis of larynx: Secondary | ICD-10-CM | POA: Diagnosis not present

## 2018-01-10 DIAGNOSIS — R061 Stridor: Secondary | ICD-10-CM | POA: Diagnosis not present

## 2018-01-10 NOTE — Progress Notes (Signed)
Pt came by today for weight check at [facility] request.  On 7/3- last OV she was 145lb, today she is up to 149 lb. Her BP was also taken: 139/81, HR-100

## 2018-01-15 ENCOUNTER — Encounter: Payer: Self-pay | Admitting: Cardiology

## 2018-01-15 NOTE — Progress Notes (Signed)
Cardiology Office Note   Date:  01/17/2018   ID:  FARIN BUHMAN, DOB 14-Apr-1941, MRN [id number]  PCP:  Chipper Herb, MD  Cardiologist:   No primary care provider on file.   Chief Complaint  Patient presents with  . AVR      History of Present Illness: [name] is a 77 y.o. female who presents for follow up after AVR and aortic surgery.  She was admitted in Jan for elective AVR and ascending root replacement.  Post op she had temponade and VF arrest.  She required an RVAD.    She had a tracheostomy placed and did go to rehab.   Since I last saw her she had an echo with a normal EF.   There was moderate TR.  The AVR looked OK.   She was in the hospital earlier this month with a very low potassium level, hypocalcemia and low magnesium.  I have reviewed the patient's medical history in detail and updated the computerized patient record.  She returns for follow up.    She still has the trach in place.  She has having this looked at and has follow up and another procedure with ENT.  She is doing well from a cardiac standpoint.  She is at the time that she had her low potassium she stopped taking the potassium supplements because of a so hard to swallow but now she crushes and puts them in applesauce.  The patient denies any new symptoms such as chest discomfort, neck or arm discomfort. There has been no new shortness of breath, PND or orthopnea. There have been no reported palpitations, presyncope or syncope.  She is walking with a cane and got rid of the walker.     Past Medical History:  Diagnosis Date  . ACL tear    right  Dr. Gladstone Pih   . Adenomatous polyp   . Anxiety   . Arthritis   . Ascending aortic aneurysm ([place])    note per chart per Dr Lucianne Lei Tright 4.7 cm 04/15/2015   . B12 deficiency   . Cataracts, both eyes 10/2006  . Chronic bronchitis ([place])   . Depression   . DUB (dysfunctional uterine bleeding) 10/96  . Fibromyalgia   . GERD (gastroesophageal reflux disease)    . Glaucoma    bilaterally  . Helicobacter pylori (H. pylori)   . Hiatal hernia   . History of kidney stones   . History of left shoulder fracture    pt states fell off bed and broke ball in shoulder had rod placed   . Hyperlipidemia   . Hypertension   . Hypothyroidism   . Laryngeal stenosis   . Occasional tremors    left arm   . Osteoarthritis   . Osteopenia   . Osteoporosis   . Thyroid cancer ([place])   . Thyroid nodule   . Tracheostomy in [facility])     Past Surgical History:  Procedure Laterality Date  . AORTIC VALVE REPLACEMENT N/A 07/10/2017   Procedure: AORTIC VALVE REPLACEMENT (AVR);  Surgeon: [name], Collier Salina, MD;  Location: [place];  Service: Open Heart Surgery;  Laterality: N/A;  . APPENDECTOMY    . BUNIONECTOMY  08/1999   right - Dr. Irving Shows   . CHOLECYSTECTOMY  1979  . DILATION AND CURETTAGE OF UTERUS  03/31/95   Dr. Ovid Curd   . DIRECT LARYNGOSCOPY WITH BOTOX INJECTION N/A 12/01/2017   Procedure: SUSPENDED DIRECT MICROLARYNGOSCOPY WITH KENALOG INJECTION, CO2  LASER;  Surgeon: Melida Quitter, MD;  Location: [place];  Service: ENT;  Laterality: N/A;  . EXPLORATION POST OPERATIVE OPEN HEART N/A 07/11/2017   Procedure: EXPLORATION POST OPERATIVE OPEN HEART;  Surgeon: Ivin Poot, MD;  Location: [place];  Service: Open Heart Surgery;  Laterality: N/A;  . EYE SURGERY     also had left cataract removed   . FRACTURE SURGERY    . HEMATOMA EVACUATION N/A 07/11/2017   Procedure: EVACUATION HEMATOMA;  Surgeon: Ivin Poot, MD;  Location: [place];  Service: Open Heart Surgery;  Laterality: N/A;  . HERNIA REPAIR    . NEPHROLITHOTOMY Left 04/07/2017   Procedure: NEPHROLITHOTOMY PERCUTANEOUS WITH SURGEON ACCESS;  Surgeon: Ardis Hughs, MD;  Location: [facility];  Service: Urology;  Laterality: Left;  . ORIF HUMERUS FRACTURE  05/30/2011   Procedure: OPEN REDUCTION INTERNAL FIXATION (ORIF) PROXIMAL HUMERUS FRACTURE;  Surgeon: Augustin Schooling;  Location: [place];  Service: Orthopedics;   Laterality: Left;  open reduction internal fixation of proximal humerus fracture  . PLACEMENT OF CENTRIMAG VENTRICULAR ASSIST DEVICE Right 07/11/2017   Procedure: PLACEMENT OF CENTRIMAG VENTRICULAR ASSIST DEVICE;  Surgeon: Ivin Poot, MD;  Location: [place];  Service: Open Heart Surgery;  Laterality: Right;  . REMOVAL OF CENTRIMAG VENTRICULAR ASSIST DEVICE N/A 07/17/2017   Procedure: REMOVAL OF RVAD WITH PUMP STANDBY;  Surgeon: Ivin Poot, MD;  Location: [place];  Service: Open Heart Surgery;  Laterality: N/A;  . REPLACEMENT ASCENDING AORTA N/A 07/10/2017   Procedure: REPLACEMENT ASCENDING AORTA;  Surgeon: Ivin Poot, MD;  Location: [place];  Service: Open Heart Surgery;  Laterality: N/A;  . right knee replacement  3/11   Dr. Gladstone Pih  . RIGHT/LEFT HEART CATH AND CORONARY ANGIOGRAPHY N/A 06/13/2017   Procedure: RIGHT/LEFT HEART CATH AND CORONARY ANGIOGRAPHY;  Surgeon: Jolaine Artist, MD;  Location: [place] [facility];  Service: Cardiovascular;  Laterality: N/A;  . rt. eye cataract  05/28/07  . TEE WITHOUT CARDIOVERSION N/A 07/10/2017   Procedure: TRANSESOPHAGEAL ECHOCARDIOGRAM (TEE);  Surgeon: [name], Collier Salina, MD;  Location: [place];  Service: Open Heart Surgery;  Laterality: N/A;  . TEE WITHOUT CARDIOVERSION  07/11/2017   Procedure: TRANSESOPHAGEAL ECHOCARDIOGRAM (TEE);  Surgeon: [name], Collier Salina, MD;  Location: [place];  Service: Open Heart Surgery;;  . TEE WITHOUT CARDIOVERSION N/A 07/17/2017   Procedure: TRANSESOPHAGEAL ECHOCARDIOGRAM (TEE);  Surgeon: [name], Collier Salina, MD;  Location: [place];  Service: Open Heart Surgery;  Laterality: N/A;  . THYROID LOBECTOMY Left 07/27/2015   Procedure: LEFT THYROID LOBECTOMY;  Surgeon: Fanny Skates, MD;  [facility];  Service: General;  Laterality: Left;  . THYROIDECTOMY N/A 08/06/2015   Procedure: RIGHT THYROID LOBECTOMY, REIMPLANTATION PARATHYROID;  Surgeon: Fanny Skates, MD;  [facility];  Service: General;  Laterality: N/A;  . VENTRAL  HERNIA REPAIR  2/99     Current Outpatient Medications  Medication Sig Dispense Refill  . calcium carbonate (OS-CAL - DOSED IN MG OF ELEMENTAL CALCIUM) 1250 (500 Ca) MG tablet Take 1 tablet (500 mg of elemental calcium total) by mouth daily with breakfast. 30 tablet 0  . clonazePAM (KLONOPIN) 0.25 MG disintegrating tablet TAKE 1 TABLET BY MOUTH 3 TIMES A DAY 90 tablet 1  . clorazepate (TRANXENE) 7.5 MG tablet TAKE 1 TABLET DAILY AS NEEDED (Patient taking differently: TAKE 1 TABLET DAILY AS NEEDED FOR ANXIETY) 30 tablet 2  . furosemide (LASIX) 20 MG tablet Take 20 mg by mouth daily.     Marland Kitchen  latanoprost (XALATAN) 0.005 % ophthalmic solution Place 1 drop into both eyes at bedtime. 2.5 mL 12  . levothyroxine (SYNTHROID, LEVOTHROID) 100 MCG tablet Take 100 mcg by mouth See admin instructions. Take one tablet by mouth every other day (alternates doses)    . levothyroxine (SYNTHROID, LEVOTHROID) 112 MCG tablet Take 112 mcg by mouth every other day. Alternates doses    . loratadine (CLARITIN) 10 MG tablet Take 1 tablet (10 mg total) by mouth daily. (Patient taking differently: Take 10 mg by mouth every evening. ) 30 tablet 11  . magnesium oxide (MAG-OX) 400 (241.3 Mg) MG tablet Take 1 tablet (400 mg total) by mouth daily. (Patient taking differently: Take 400 mg by mouth 2 (two) times daily. ) 30 tablet 0  . Multiple Vitamin (MULTIVITAMIN WITH MINERALS) TABS tablet Take 1 tablet by mouth daily.    . potassium chloride SA (K-DUR,KLOR-CON) 20 MEQ tablet Take 20 mEq by mouth. Take 2 tablets by mouth twice daily    . QUEtiapine (SEROQUEL) 25 MG tablet 25 mg. Take 3 tablets by mouth at bedtime    . venlafaxine (EFFEXOR) 37.5 MG tablet Take 1 tablet (37.5 mg total) by mouth 2 (two) times daily. 60 tablet 11  . amoxicillin (AMOXIL) 500 MG capsule Take 4 capsules by mouth as directed. Patient takes 4 capsules at once prior to dental appointments  0  . aspirin 81 MG chewable tablet Chew 81 mg by mouth at bedtime.      Marland Kitchen guaiFENesin (ROBITUSSIN) 100 MG/5ML SOLN Take 5 mLs (100 mg total) by mouth 3 (three) times daily with meals. (Patient taking differently: Take 5 mLs by mouth daily. ) 1200 mL 0  . HYDROcodone-acetaminophen (NORCO/VICODIN) 5-325 MG tablet Take 1-2 tablets by mouth every 6 (six) hours as needed for moderate pain. (Patient not taking: Reported on 01/11/2018) 12 tablet 0  . PROCTOZONE-HC 2.5 % rectal cream APPLY RECTALLY 2 TIMES A DAY AS NEEDED (Patient not taking: Reported on 01/17/2018) 30 g 2   Current Facility-Administered Medications  Medication Dose Route Frequency Provider Last Rate Last Dose  . cyanocobalamin ((VITAMIN B-12)) injection 1,000 mcg  1,000 mcg Intramuscular Q30 days Chipper Herb, MD   1,000 mcg at 12/26/17 1554    Allergies:   Actonel [risedronate]; Lipitor [atorvastatin calcium]; Lyrica [pregabalin]; Sibutramine hcl monohydrate; Alendronate sodium; Latex; Levofloxacin; and Meloxicam    ROS:  Please see the history of present illness.   Otherwise, review of systems are positive for none.   All other systems are reviewed and negative.    PHYSICAL EXAM: VS:  BP 128/78   Pulse 88   Ht 5\' 4"  (1.626 m)   Wt 146 lb (66.2 kg)   BMI 25.06 kg/m  , BMI Body mass index is 25.06 kg/m.  GENERAL:  Well appearing NECK:  No jugular venous distention, waveform within normal limits, carotid upstroke brisk and symmetric, no bruits, no thyromegaly, trach in place LUNGS:  Clear to auscultation bilaterally CHEST:  Well healed sternotomy scar.  HEART:  PMI not displaced or sustained,S1 and S2 within normal limits, no S3, no S4, no clicks, no rubs, no murmurs ABD:  Flat, positive bowel sounds normal in frequency in pitch, no bruits, no rebound, no guarding, no midline pulsatile mass, no hepatomegaly, no splenomegaly EXT:  2 plus pulses throughout, mild left leg edema, no cyanosis no clubbing   EKG:  EKG is not  ordered today.   Recent Labs: 09/18/2017: TSH 6.660 12/27/2017:  Hemoglobin 12.6; Platelets 299  12/28/2017: ALT 16 01/01/2018: BUN 5; Creatinine, Ser 0.90; Magnesium 1.8; Potassium 4.2; Sodium 143    Lipid Panel    Component Value Date/Time   CHOL 165 03/15/2017 0920   CHOL 166 10/25/2012 0826   TRIG 530 (H) 07/23/2017 1358   TRIG 92 05/03/2016 1437   TRIG 82 10/25/2012 0826   HDL 57 03/15/2017 0920   HDL 58 05/03/2016 1437   HDL 69 10/25/2012 0826   CHOLHDL 2.9 03/15/2017 0920   LDLCALC 89 03/15/2017 0920   LDLCALC 91 03/14/2014 1139   LDLCALC 81 10/25/2012 0826      Wt Readings from Last 3 Encounters:  01/17/18 146 lb (66.2 kg)  12/27/17 145 lb (65.8 kg)  12/27/17 145 lb (65.8 kg)      Other studies Reviewed: Additional studies/ records that were reviewed today include: Hospital records Review of the above records demonstrates:     ASSESSMENT AND PLAN:  AVR/AORTIC ROOT REPLACEMENT:      This is stable.  She has actually done quite well from this.  She has follow with Dr. [name] soon.  No change in therapy at this point.   RV FAILURE:   This looked normal on the recent echo.  No change in therapy.  HTN:  The blood pressure is at target.  Continue current therapy.   EDEMA:      This is much improved.  No change in therapy.   HYPOKALEMIA:  I reviewed the hospital records and recent labs.  Her potassium is corrected.  She remains on supplements for the potassium, calcium and mag.  Her diet is better.  No change in therapy.  We will continue to follow electrolytes.    Current medicines are reviewed at length with the patient today.  The patient does not have concerns regarding medicines.  The following changes have been made:  none  Labs/ tests ordered today include: None  No orders of the defined types were placed in this encounter.    Disposition:   FU with me in 6 months.     Signed, Minus Breeding, MD  01/17/2018 1:20 PM    [facility]

## 2018-01-17 ENCOUNTER — Ambulatory Visit: Payer: Medicare Other | Admitting: Cardiology

## 2018-01-17 ENCOUNTER — Encounter: Payer: Self-pay | Admitting: Cardiology

## 2018-01-17 VITALS — BP 128/78 | HR 88 | Ht 64.0 in | Wt 146.0 lb

## 2018-01-17 DIAGNOSIS — E876 Hypokalemia: Secondary | ICD-10-CM | POA: Diagnosis not present

## 2018-01-17 DIAGNOSIS — M7989 Other specified soft tissue disorders: Secondary | ICD-10-CM | POA: Diagnosis not present

## 2018-01-17 DIAGNOSIS — I10 Essential (primary) hypertension: Secondary | ICD-10-CM

## 2018-01-17 DIAGNOSIS — Z952 Presence of prosthetic heart valve: Secondary | ICD-10-CM

## 2018-01-17 NOTE — Pre-Procedure Instructions (Signed)
[name]  01/17/2018      [address] [place] [address] Phone: [phone] Fax: [phone]    Your procedure is scheduled on Wed., January 24, 2018 from 10:30AM-11:15AM  Report to [facility] Admitting Entrance "A" at 8:30AM  Call this number if you have problems the morning of surgery:  [phone]   Remember:  Do not eat or drink after midnight on July 30th    Take these medicines the morning of surgery with A SIP OF WATER: ClonazePAM (KLONOPIN), GuaiFENesin (ROBITUSSIN), Levothyroxine (SYNTHROID, LEVOTHROID), and Venlafaxine (EFFEXOR)   If needed Clorazepate (TRANXENE)   Follow your surgeon's instructions on when to stop Asprin.  If no instructions were given by your surgeon then you will need to call the office to get those instructions.    As of today, stop taking all Other Aspirin Products, Vitamins, Fish oils, and Herbal medications. Also stop all NSAIDS i.e. Advil, Ibuprofen, Motrin, Aleve, Anaprox, Naproxen, BC, Goody Powders, and all Supplements.   Do not wear jewelry, make-up or nail polish.  Do not wear lotions, powders, or perfumes, or deodorant.  Do not shave 48 hours prior to surgery.    Do not bring valuables to the hospital.  [facility] is not responsible for any belongings or valuables.  Contacts, dentures or bridgework may not be worn into surgery.  Leave your suitcase in the car.  After surgery it may be brought to your room.  For patients admitted to the hospital, discharge time will be determined by your treatment team.  Patients discharged the day of surgery will not be allowed to drive home.   Special instructions:  - Preparing For Surgery  Before surgery, you can play an important role. Because skin is not sterile, your skin needs to be as free of germs as possible. You can reduce the number of germs on your skin by washing with CHG (chlorahexidine  gluconate) Soap before surgery.  CHG is an antiseptic cleaner which kills germs and bonds with the skin to continue killing germs even after washing.    Oral Hygiene is also important to reduce your risk of infection.  Remember - BRUSH YOUR TEETH THE MORNING OF SURGERY WITH YOUR REGULAR TOOTHPASTE  Please do not use if you have an allergy to CHG or antibacterial soaps. If your skin becomes reddened/irritated stop using the CHG.  Do not shave (including legs and underarms) for at least 48 hours prior to first CHG shower. It is OK to shave your face.  Please follow these instructions carefully.   1. Shower the NIGHT BEFORE SURGERY and the MORNING OF SURGERY with CHG.   2. If you chose to wash your hair, wash your hair first as usual with your normal shampoo.  3. After you shampoo, rinse your hair and body thoroughly to remove the shampoo.  4. Use CHG as you would any other liquid soap. You can apply CHG directly to the skin and wash gently with a scrungie or a clean washcloth.   5. Apply the CHG Soap to your body ONLY FROM THE NECK DOWN.  Do not use on open wounds or open sores. Avoid contact with your eyes, ears, mouth and genitals (private parts). Wash Face and genitals (private parts)  with your normal soap.  6. Wash thoroughly, paying special attention to the area where your surgery will be performed.  7. Thoroughly rinse your body  with warm water from the neck down.  8. DO NOT shower/wash with your normal soap after using and rinsing off the CHG Soap.  9. Pat yourself dry with a CLEAN TOWEL.  10. Wear CLEAN PAJAMAS to bed the night before surgery, wear comfortable clothes the morning of surgery  11. Place CLEAN SHEETS on your bed the night of your first shower and DO NOT SLEEP WITH PETS.  Day of Surgery:  Do not apply any deodorants/lotions.  Please wear clean clothes to the hospital/surgery center.   Remember to brush your teeth WITH YOUR REGULAR TOOTHPASTE.  Please read over  the following fact sheets that you were given. Pain Booklet, Coughing and Deep Breathing and Surgical Site Infection Prevention

## 2018-01-17 NOTE — Patient Instructions (Signed)
Medication Instructions:  The current medical regimen is effective;  continue present plan and medications.  Follow-Up: Follow up in 6 months with Dr. Warren Lacy.  You will receive a letter in the mail 2 months before you are due.  Please call us when you receive this letter to schedule your follow up appointment.  Thank you for choosing [facility]!!

## 2018-01-18 ENCOUNTER — Encounter (HOSPITAL_COMMUNITY)
Admission: RE | Admit: 2018-01-18 | Discharge: 2018-01-18 | Disposition: A | Discharge status: Home / Self Care | Payer: Medicare Other | Source: Ambulatory Visit | Attending: Otolaryngology | Admitting: Otolaryngology

## 2018-01-18 ENCOUNTER — Encounter (HOSPITAL_COMMUNITY): Payer: Self-pay

## 2018-01-18 ENCOUNTER — Other Ambulatory Visit: Payer: Self-pay

## 2018-01-18 DIAGNOSIS — Z01812 Encounter for preprocedural laboratory examination: Secondary | ICD-10-CM | POA: Insufficient documentation

## 2018-01-18 DIAGNOSIS — E785 Hyperlipidemia, unspecified: Secondary | ICD-10-CM | POA: Insufficient documentation

## 2018-01-18 DIAGNOSIS — M810 Age-related osteoporosis without current pathological fracture: Secondary | ICD-10-CM | POA: Insufficient documentation

## 2018-01-18 DIAGNOSIS — Z85850 Personal history of malignant neoplasm of thyroid: Secondary | ICD-10-CM | POA: Insufficient documentation

## 2018-01-18 DIAGNOSIS — F329 Major depressive disorder, single episode, unspecified: Secondary | ICD-10-CM | POA: Insufficient documentation

## 2018-01-18 DIAGNOSIS — I10 Essential (primary) hypertension: Secondary | ICD-10-CM | POA: Diagnosis not present

## 2018-01-18 HISTORY — DX: Stridor: R06.1

## 2018-01-18 LAB — BASIC METABOLIC PANEL
Anion gap: 11 (ref 5–15)
BUN: 5 mg/dL — ABNORMAL LOW (ref 8–23)
CHLORIDE: 101 mmol/L (ref 98–111)
CO2: 29 mmol/L (ref 22–32)
Calcium: 8.6 mg/dL — ABNORMAL LOW (ref 8.9–10.3)
Creatinine, Ser: 0.83 mg/dL (ref 0.44–1.00)
GFR calc Af Amer: >60 mL/min (ref >60)
GFR calc non Af Amer: >60 mL/min (ref >60)
Glucose, Bld: 92 mg/dL (ref 70–99)
POTASSIUM: 3.9 mmol/L (ref 3.5–5.1)
Sodium: 141 mmol/L (ref 135–145)

## 2018-01-18 LAB — CBC
HEMATOCRIT: 40.1 % (ref 36.0–46.0)
Hemoglobin: 12.9 g/dL (ref 12.0–15.0)
MCH: 28.5 pg (ref 26.0–34.0)
MCHC: 32.2 g/dL (ref 30.0–36.0)
MCV: 88.5 fL (ref 78.0–100.0)
Platelets: 275 10*3/uL (ref 150–400)
RBC: 4.53 MIL/uL (ref 3.87–5.11)
RDW: 14.4 % (ref 11.5–15.5)
WBC: 4.5 10*3/uL (ref 4.0–10.5)

## 2018-01-18 NOTE — Progress Notes (Signed)
PCP - Dr. Redge Gainer  Cardiologist - Dr. Jeneen Rinks Hochrein/ Dr. Dahlia Byes  Chest x-ray - 12/27/17 (E)  EKG - 12/29/17 (E)  Stress Test - 05/22/13 (E)  ECHO - 12/18/17 (E)  Cardiac Cath - 06/13/17 (E)  Sleep Study - Denies CPAP - Denies  LABS- 01/18/18: CBC, BMP  ASA- LD-7/25, per Lattie Haw from Dr. Redmond Baseman' office   Anesthesia- Yes- Trach, cardiac history  Pt denies having chest pain, sob, or fever at this time. All instructions explained to the pt, with a verbal understanding of the material. Pt agrees to go over the instructions while at home for a better understanding. The opportunity to ask questions was provided.

## 2018-01-19 NOTE — Progress Notes (Signed)
Anesthesia Chart Review:  Case:  [id number] Date/Time:  01/24/18 1015   Procedure:  MICROLARYNGOSCOPY WITH LASER AND BALLOON DILATION (N/A )   Anesthesia type:  General   Pre-op diagnosis:  Laryngeal stenosis and Inspiratory stridor   Location:  [facility] OR ROOM 09 / [place] [facility]   Surgeon:  Melida Quitter, MD      DISCUSSION: 77 yo female never smoker for above procedure. Pertinent hx incldues, Depression, Anxiety, GERD, Laryngeal stenosis, Fibromyalgia, HLD, Hiatal hernia, HTN, Arthritis, Glaucoma, Occasional tremors of left arm, Chronic bronchitis, Hypothyroid, Tracheostomy in place, s/p AVR and aortic root surgery 07/10/2017.  Hospitalized 1/14-2/16/19: AVR/ascending aortic replacement 7/42/59 complicated by tamponade and VF arrest; RVAD was placed, significant hematoma evacuated 07/11/17. Pt required ventilatory management as well as significant inotropic support. RVAD removed 07/17/17. She was weaned from the ventilator but did not tolerate this. She required reintubation and placement of tracheostomy.   Based on review of Dr. Percival Spanish and Dr. Lucianne Lei Trigt's notes the pt is actually doing quite well from a cardiac standpoint and her biggest issue is now persistent trach dependence. She underwent laryngoscopy with CO2 laser excision 12/01/2017 by Dr. Redmond Baseman without complication.  Pt was hospitalized 7/3-12/28/2017 for hypokalemia, hypomagnesemia, hypocalcemia. She was repleted and discharged in stable condition. Evidently she was having difficult taking prescribed potassium pills due to size. She is now crushing the tablets and taking as directed.  Pt was seen by Dr. Percival Spanish 01/17/18, discussed that she may have upcoming ENT procedure. Per his note "She still has the trach in place.  She has having this looked at and has follow up and another procedure with ENT.  She is doing well from a cardiac standpoint."  Anticipate she can proceed with surgery as planned barring acute status change   VS: BP 137/63   Pulse 77    Temp 36.6 C   Resp 20   Ht 5\' 4"  (1.626 m)   Wt 146 lb 8 oz (66.5 kg)   SpO2 100%   BMI 25.15 kg/m   PROVIDERS: Chipper Herb, MD   LABS: Labs reviewed: Acceptable for surgery. (all labs ordered are listed, but only abnormal results are displayed)  Labs Reviewed  BASIC METABOLIC PANEL - Abnormal; Notable for the following components:      Result Value   BUN 5 (*)    Calcium 8.6 (*)    All other components within normal limits  CBC     IMAGES: CHEST - 2 VIEW 12/27/2017  COMPARISON:  Radiographs of September 13, 2017.  FINDINGS: The heart size and mediastinal contours are within normal limits. Status post aortic valve replacement. Tracheostomy tube is in grossly good position. No pneumothorax or pleural effusion is noted. Both lungs are clear. The visualized skeletal structures are unremarkable.  IMPRESSION: No active cardiopulmonary disease.   EKG: 12/27/2017: Ectopic atrial rhythm.  Borderline short PR interval.  Right bundle branch block.  Inferior infarct, old.  Probable anterolateral infarct, age undetermined.   11/01/2017: Sinus tachycardia.  Left axis deviation.  Right bundle branch block.  Inferior infarct, age undetermined.  CV: Echo 12/18/2017: Study Conclusions  - Left ventricle: The cavity size was normal. Wall thickness was   normal. Systolic function was normal. The estimated ejection   fraction was in the range of 60% to 65%. Wall motion was normal;   there were no regional wall motion abnormalities. - Aortic valve: There is a bioprothestic valve in the AV position.   Despite chart review cannot determine type  or size of valve from   records. There was no stenosis. There was no significant   regurgitation. Mean gradient (S): 15 mm Hg. - Right atrium: The atrium was mildly dilated. - Atrial septum: No defect or patent foramen ovale was identified. - Tricuspid valve: There was moderate regurgitation. - Pulmonary arteries: Systolic pressure was  mildly increased. PA   peak pressure: 31 mm Hg (S). - Technically adequate study.  Past Medical History:  Diagnosis Date  . ACL tear    right  Dr. Gladstone Pih   . Adenomatous polyp   . Anxiety   . Arthritis   . Ascending aortic aneurysm ([place])    note per chart per Dr Lucianne Lei Tright 4.7 cm 04/15/2015   . B12 deficiency   . Cataracts, both eyes 10/2006  . Chronic bronchitis ([place])   . Depression   . DUB (dysfunctional uterine bleeding) 10/96  . Fibromyalgia   . GERD (gastroesophageal reflux disease)   . Glaucoma    bilaterally  . Helicobacter pylori (H. pylori)   . Hiatal hernia   . History of kidney stones   . History of left shoulder fracture    pt states fell off bed and broke ball in shoulder had rod placed   . Hyperlipidemia   . Hypertension   . Hypothyroidism   . Inspiratory stridor   . Laryngeal stenosis   . Occasional tremors    left arm   . Osteoarthritis   . Osteopenia   . Osteoporosis   . Thyroid cancer ([place])   . Thyroid nodule   . Tracheostomy in place [facility])     Past Surgical History:  Procedure Laterality Date  . AORTIC VALVE REPLACEMENT N/A 07/10/2017   Procedure: AORTIC VALVE REPLACEMENT (AVR);  Surgeon: [name], Collier Salina, MD;  Location: [place];  Service: Open Heart Surgery;  Laterality: N/A;  . APPENDECTOMY    . BUNIONECTOMY  08/1999   right - Dr. Irving Shows   . CHOLECYSTECTOMY  1979  . DILATION AND CURETTAGE OF UTERUS  03/31/95   Dr. Ovid Curd   . DIRECT LARYNGOSCOPY WITH BOTOX INJECTION N/A 12/01/2017   Procedure: SUSPENDED DIRECT MICROLARYNGOSCOPY WITH KENALOG INJECTION, CO2 LASER;  Surgeon: Melida Quitter, MD;  Location: [place];  Service: ENT;  Laterality: N/A;  . EXPLORATION POST OPERATIVE OPEN HEART N/A 07/11/2017   Procedure: EXPLORATION POST OPERATIVE OPEN HEART;  Surgeon: Ivin Poot, MD;  Location: [place];  Service: Open Heart Surgery;  Laterality: N/A;  . EYE SURGERY     also had left cataract removed   . FRACTURE SURGERY    . HEMATOMA EVACUATION N/A  07/11/2017   Procedure: EVACUATION HEMATOMA;  Surgeon: Ivin Poot, MD;  Location: [place];  Service: Open Heart Surgery;  Laterality: N/A;  . HERNIA REPAIR    . NEPHROLITHOTOMY Left 04/07/2017   Procedure: NEPHROLITHOTOMY PERCUTANEOUS WITH SURGEON ACCESS;  Surgeon: Ardis Hughs, MD;  [facility];  Service: Urology;  Laterality: Left;  . ORIF HUMERUS FRACTURE  05/30/2011   Procedure: OPEN REDUCTION INTERNAL FIXATION (ORIF) PROXIMAL HUMERUS FRACTURE;  Surgeon: Augustin Schooling;  Location: [place];  Service: Orthopedics;  Laterality: Left;  open reduction internal fixation of proximal humerus fracture  . PLACEMENT OF CENTRIMAG VENTRICULAR ASSIST DEVICE Right 07/11/2017   Procedure: PLACEMENT OF CENTRIMAG VENTRICULAR ASSIST DEVICE;  Surgeon: Ivin Poot, MD;  Location: [place];  Service: Open Heart Surgery;  Laterality: Right;  . REMOVAL OF CENTRIMAG VENTRICULAR ASSIST DEVICE N/A 07/17/2017  Procedure: REMOVAL OF RVAD WITH PUMP STANDBY;  Surgeon: Ivin Poot, MD;  Location: [place];  Service: Open Heart Surgery;  Laterality: N/A;  . REPLACEMENT ASCENDING AORTA N/A 07/10/2017   Procedure: REPLACEMENT ASCENDING AORTA;  Surgeon: Ivin Poot, MD;  Location: [place];  Service: Open Heart Surgery;  Laterality: N/A;  . right knee replacement  3/11   Dr. Gladstone Pih  . RIGHT/LEFT HEART CATH AND CORONARY ANGIOGRAPHY N/A 06/13/2017   Procedure: RIGHT/LEFT HEART CATH AND CORONARY ANGIOGRAPHY;  Surgeon: Jolaine Artist, MD;  Location: [place] CV LAB;  Service: Cardiovascular;  Laterality: N/A;  . rt. eye cataract  05/28/07  . TEE WITHOUT CARDIOVERSION N/A 07/10/2017   Procedure: TRANSESOPHAGEAL ECHOCARDIOGRAM (TEE);  Surgeon: [name], Collier Salina, MD;  Location: [place];  Service: Open Heart Surgery;  Laterality: N/A;  . TEE WITHOUT CARDIOVERSION  07/11/2017   Procedure: TRANSESOPHAGEAL ECHOCARDIOGRAM (TEE);  Surgeon: [name], Collier Salina, MD;  Location: [place];  Service: Open Heart Surgery;;  . TEE  WITHOUT CARDIOVERSION N/A 07/17/2017   Procedure: TRANSESOPHAGEAL ECHOCARDIOGRAM (TEE);  Surgeon: [name], Collier Salina, MD;  Location: [place];  Service: Open Heart Surgery;  Laterality: N/A;  . THYROID LOBECTOMY Left 07/27/2015   Procedure: LEFT THYROID LOBECTOMY;  Surgeon: Fanny Skates, MD;  [facility];  Service: General;  Laterality: Left;  . THYROIDECTOMY N/A 08/06/2015   Procedure: RIGHT THYROID LOBECTOMY, REIMPLANTATION PARATHYROID;  Surgeon: Fanny Skates, MD;  [facility];  Service: General;  Laterality: N/A;  . VENTRAL HERNIA REPAIR  2/99    MEDICATIONS: . amoxicillin (AMOXIL) 500 MG capsule  . aspirin 81 MG chewable tablet  . calcium carbonate (OS-CAL - DOSED IN MG OF ELEMENTAL CALCIUM) 1250 (500 Ca) MG tablet  . clonazePAM (KLONOPIN) 0.25 MG disintegrating tablet  . clorazepate (TRANXENE) 7.5 MG tablet  . furosemide (LASIX) 20 MG tablet  . guaiFENesin (ROBITUSSIN) 100 MG/5ML SOLN  . HYDROcodone-acetaminophen (NORCO/VICODIN) 5-325 MG tablet  . latanoprost (XALATAN) 0.005 % ophthalmic solution  . levothyroxine (SYNTHROID, LEVOTHROID) 100 MCG tablet  . levothyroxine (SYNTHROID, LEVOTHROID) 112 MCG tablet  . loratadine (CLARITIN) 10 MG tablet  . magnesium oxide (MAG-OX) 400 (241.3 Mg) MG tablet  . Multiple Vitamin (MULTIVITAMIN WITH MINERALS) TABS tablet  . potassium chloride SA (K-DUR,KLOR-CON) 20 MEQ tablet  . PROCTOZONE-HC 2.5 % rectal cream  . QUEtiapine (SEROQUEL) 25 MG tablet  . venlafaxine (EFFEXOR) 37.5 MG tablet   . cyanocobalamin ((VITAMIN B-12)) injection 1,000 mcg     Wynonia Musty [facility] Short Stay Center/Anesthesiology Phone [phone] 01/19/2018 11:19 AM

## 2018-01-24 ENCOUNTER — Encounter (HOSPITAL_COMMUNITY)
Admission: RE | Disposition: A | Discharge status: Home / Self Care | Payer: Self-pay | Source: Ambulatory Visit | Attending: Otolaryngology

## 2018-01-24 ENCOUNTER — Encounter (HOSPITAL_COMMUNITY): Payer: Self-pay | Admitting: *Deleted

## 2018-01-24 ENCOUNTER — Ambulatory Visit (HOSPITAL_COMMUNITY): Payer: Medicare Other | Admitting: Physician Assistant

## 2018-01-24 ENCOUNTER — Ambulatory Visit (HOSPITAL_COMMUNITY)
Admission: RE | Admit: 2018-01-24 | Discharge: 2018-01-24 | Disposition: A | Discharge status: Home / Self Care | Payer: Medicare Other | Source: Ambulatory Visit | Attending: Otolaryngology | Admitting: Otolaryngology

## 2018-01-24 ENCOUNTER — Ambulatory Visit (HOSPITAL_COMMUNITY): Payer: Medicare Other | Admitting: Anesthesiology

## 2018-01-24 DIAGNOSIS — J955 Postprocedural subglottic stenosis: Secondary | ICD-10-CM | POA: Diagnosis not present

## 2018-01-24 DIAGNOSIS — R061 Stridor: Secondary | ICD-10-CM | POA: Diagnosis not present

## 2018-01-24 DIAGNOSIS — Z85850 Personal history of malignant neoplasm of thyroid: Secondary | ICD-10-CM | POA: Insufficient documentation

## 2018-01-24 DIAGNOSIS — Z800 Family history of malignant neoplasm of digestive organs: Secondary | ICD-10-CM | POA: Insufficient documentation

## 2018-01-24 DIAGNOSIS — Z86010 Personal history of colonic polyps: Secondary | ICD-10-CM | POA: Insufficient documentation

## 2018-01-24 DIAGNOSIS — M1990 Unspecified osteoarthritis, unspecified site: Secondary | ICD-10-CM | POA: Diagnosis not present

## 2018-01-24 DIAGNOSIS — Z888 Allergy status to other drugs, medicaments and biological substances status: Secondary | ICD-10-CM | POA: Insufficient documentation

## 2018-01-24 DIAGNOSIS — R251 Tremor, unspecified: Secondary | ICD-10-CM | POA: Insufficient documentation

## 2018-01-24 DIAGNOSIS — Z79899 Other long term (current) drug therapy: Secondary | ICD-10-CM | POA: Insufficient documentation

## 2018-01-24 DIAGNOSIS — F329 Major depressive disorder, single episode, unspecified: Secondary | ICD-10-CM | POA: Insufficient documentation

## 2018-01-24 DIAGNOSIS — Z930 Tracheostomy status: Secondary | ICD-10-CM | POA: Insufficient documentation

## 2018-01-24 DIAGNOSIS — Z8262 Family history of osteoporosis: Secondary | ICD-10-CM | POA: Insufficient documentation

## 2018-01-24 DIAGNOSIS — I739 Peripheral vascular disease, unspecified: Secondary | ICD-10-CM | POA: Diagnosis not present

## 2018-01-24 DIAGNOSIS — Z87442 Personal history of urinary calculi: Secondary | ICD-10-CM | POA: Diagnosis not present

## 2018-01-24 DIAGNOSIS — M797 Fibromyalgia: Secondary | ICD-10-CM | POA: Insufficient documentation

## 2018-01-24 DIAGNOSIS — I10 Essential (primary) hypertension: Secondary | ICD-10-CM | POA: Insufficient documentation

## 2018-01-24 DIAGNOSIS — E890 Postprocedural hypothyroidism: Secondary | ICD-10-CM | POA: Insufficient documentation

## 2018-01-24 DIAGNOSIS — H409 Unspecified glaucoma: Secondary | ICD-10-CM | POA: Insufficient documentation

## 2018-01-24 DIAGNOSIS — J42 Unspecified chronic bronchitis: Secondary | ICD-10-CM | POA: Diagnosis not present

## 2018-01-24 DIAGNOSIS — Z9049 Acquired absence of other specified parts of digestive tract: Secondary | ICD-10-CM | POA: Insufficient documentation

## 2018-01-24 DIAGNOSIS — Y838 Other surgical procedures as the cause of abnormal reaction of the patient, or of later complication, without mention of misadventure at the time of the procedure: Secondary | ICD-10-CM | POA: Insufficient documentation

## 2018-01-24 DIAGNOSIS — F419 Anxiety disorder, unspecified: Secondary | ICD-10-CM | POA: Diagnosis not present

## 2018-01-24 DIAGNOSIS — K219 Gastro-esophageal reflux disease without esophagitis: Secondary | ICD-10-CM | POA: Diagnosis not present

## 2018-01-24 DIAGNOSIS — E538 Deficiency of other specified B group vitamins: Secondary | ICD-10-CM | POA: Insufficient documentation

## 2018-01-24 DIAGNOSIS — N289 Disorder of kidney and ureter, unspecified: Secondary | ICD-10-CM | POA: Insufficient documentation

## 2018-01-24 DIAGNOSIS — E785 Hyperlipidemia, unspecified: Secondary | ICD-10-CM | POA: Insufficient documentation

## 2018-01-24 DIAGNOSIS — K449 Diaphragmatic hernia without obstruction or gangrene: Secondary | ICD-10-CM | POA: Insufficient documentation

## 2018-01-24 DIAGNOSIS — Z7982 Long term (current) use of aspirin: Secondary | ICD-10-CM | POA: Insufficient documentation

## 2018-01-24 DIAGNOSIS — Z952 Presence of prosthetic heart valve: Secondary | ICD-10-CM | POA: Diagnosis not present

## 2018-01-24 DIAGNOSIS — J386 Stenosis of larynx: Secondary | ICD-10-CM | POA: Diagnosis not present

## 2018-01-24 DIAGNOSIS — Z91040 Latex allergy status: Secondary | ICD-10-CM | POA: Insufficient documentation

## 2018-01-24 DIAGNOSIS — Z8249 Family history of ischemic heart disease and other diseases of the circulatory system: Secondary | ICD-10-CM | POA: Insufficient documentation

## 2018-01-24 DIAGNOSIS — Z808 Family history of malignant neoplasm of other organs or systems: Secondary | ICD-10-CM | POA: Insufficient documentation

## 2018-01-24 HISTORY — PX: MICROLARYNGOSCOPY WITH LASER AND BALLOON DILATION: SHX5973

## 2018-01-24 SURGERY — MICROLARYNGOSCOPY WITH LASER AND BALLOON DILATION
Anesthesia: General

## 2018-01-24 MED ORDER — MIDAZOLAM HCL 5 MG/5ML IJ SOLN
INTRAMUSCULAR | Status: DC | PRN
  Administered 2018-01-24: 1 mg via INTRAVENOUS

## 2018-01-24 MED ORDER — MIDAZOLAM HCL 2 MG/2ML IJ SOLN
INTRAMUSCULAR | Status: AC
  Filled 2018-01-24: qty 2

## 2018-01-24 MED ORDER — DEXAMETHASONE SODIUM PHOSPHATE 10 MG/ML IJ SOLN
INTRAMUSCULAR | Status: DC | PRN
  Administered 2018-01-24: 10 mg via INTRAVENOUS

## 2018-01-24 MED ORDER — SUCCINYLCHOLINE CHLORIDE 200 MG/10ML IV SOSY
PREFILLED_SYRINGE | INTRAVENOUS | Status: AC
  Filled 2018-01-24: qty 10

## 2018-01-24 MED ORDER — ONDANSETRON HCL 4 MG/2ML IJ SOLN
INTRAMUSCULAR | Status: AC
  Filled 2018-01-24: qty 2

## 2018-01-24 MED ORDER — SUGAMMADEX SODIUM 200 MG/2ML IV SOLN
INTRAVENOUS | Status: DC | PRN
  Administered 2018-01-24: 200 mg via INTRAVENOUS

## 2018-01-24 MED ORDER — PROPOFOL 10 MG/ML IV BOLUS
INTRAVENOUS | Status: DC | PRN
  Administered 2018-01-24: 110 mg via INTRAVENOUS

## 2018-01-24 MED ORDER — FENTANYL CITRATE (PF) 100 MCG/2ML IJ SOLN: 25.0000 ug | INTRAMUSCULAR | Status: DC | PRN

## 2018-01-24 MED ORDER — ONDANSETRON HCL 4 MG/2ML IJ SOLN
INTRAMUSCULAR | Status: DC | PRN
  Administered 2018-01-24: 4 mg via INTRAVENOUS

## 2018-01-24 MED ORDER — EPINEPHRINE HCL (NASAL) 0.1 % NA SOLN
NASAL | Status: AC
  Filled 2018-01-24: qty 30

## 2018-01-24 MED ORDER — LACTATED RINGERS IV SOLN
INTRAVENOUS | Status: DC
  Administered 2018-01-24: 09:00:00 via INTRAVENOUS

## 2018-01-24 MED ORDER — SUCCINYLCHOLINE CHLORIDE 200 MG/10ML IV SOSY
PREFILLED_SYRINGE | INTRAVENOUS | Status: DC | PRN
  Administered 2018-01-24: 120 mg via INTRAVENOUS

## 2018-01-24 MED ORDER — LIDOCAINE HCL (CARDIAC) PF 100 MG/5ML IV SOSY
PREFILLED_SYRINGE | INTRAVENOUS | Status: DC | PRN
  Administered 2018-01-24: 60 mg via INTRAVENOUS

## 2018-01-24 MED ORDER — FENTANYL CITRATE (PF) 100 MCG/2ML IJ SOLN
INTRAMUSCULAR | Status: DC | PRN
  Administered 2018-01-24 (×3): 50 ug via INTRAVENOUS

## 2018-01-24 MED ORDER — ROCURONIUM BROMIDE 10 MG/ML (PF) SYRINGE
PREFILLED_SYRINGE | INTRAVENOUS | Status: DC | PRN
  Administered 2018-01-24: 20 mg via INTRAVENOUS

## 2018-01-24 MED ORDER — PROPOFOL 10 MG/ML IV BOLUS
INTRAVENOUS | Status: AC
  Filled 2018-01-24: qty 20

## 2018-01-24 MED ORDER — EPINEPHRINE PF 1 MG/ML IJ SOLN
INTRAMUSCULAR | Status: DC | PRN
  Administered 2018-01-24: 1 mg

## 2018-01-24 MED ORDER — FENTANYL CITRATE (PF) 250 MCG/5ML IJ SOLN
INTRAMUSCULAR | Status: AC
  Filled 2018-01-24: qty 5

## 2018-01-24 SURGICAL SUPPLY — 33 items
BALLN PULM 15 16.5 18X75 (BALLOONS) ×2
BALLN PULMONARY 10-12 (MISCELLANEOUS) IMPLANT
BALLN PULMONARY 8-10 OD75 (MISCELLANEOUS) IMPLANT
BALLOON PULM 15 16.5 18X75 (BALLOONS) IMPLANT
BLADE SURG 15 STRL LF DISP TIS (BLADE) IMPLANT
BLADE SURG 15 STRL SS (BLADE)
CANISTER SUCT 3000ML PPV (MISCELLANEOUS) ×2 IMPLANT
CONT SPEC 4OZ CLIKSEAL STRL BL (MISCELLANEOUS) IMPLANT
COVER BACK TABLE 60X90IN (DRAPES) ×2 IMPLANT
CRADLE DONUT ADULT HEAD (MISCELLANEOUS) IMPLANT
DRAPE HALF SHEET 40X57 (DRAPES) ×2 IMPLANT
GAUZE SPONGE 4X4 12PLY STRL (GAUZE/BANDAGES/DRESSINGS) ×2 IMPLANT
GLOVE BIO SURGEON STRL SZ7.5 (GLOVE) ×2 IMPLANT
GOWN STRL REUS W/ TWL LRG LVL3 (GOWN DISPOSABLE) IMPLANT
GOWN STRL REUS W/TWL LRG LVL3 (GOWN DISPOSABLE)
KIT BASIN OR (CUSTOM PROCEDURE TRAY) ×2 IMPLANT
KIT TURNOVER KIT B (KITS) ×2 IMPLANT
MARKER SKIN DUAL TIP RULER LAB (MISCELLANEOUS) IMPLANT
NDL HYPO 25GX1X1/2 BEV (NEEDLE) IMPLANT
NEEDLE HYPO 25GX1X1/2 BEV (NEEDLE) IMPLANT
NS IRRIG 1000ML POUR BTL (IV SOLUTION) ×2 IMPLANT
PAD ARMBOARD 7.5X6 YLW CONV (MISCELLANEOUS) ×4 IMPLANT
PAD EYE OVAL STERILE LF (GAUZE/BANDAGES/DRESSINGS) IMPLANT
PATTIES SURGICAL .5 X.5 (GAUZE/BANDAGES/DRESSINGS) IMPLANT
PATTIES SURGICAL .5 X3 (DISPOSABLE) ×2 IMPLANT
SOLUTION ANTI FOG 6CC (MISCELLANEOUS) ×1 IMPLANT
SURGILUBE 2OZ TUBE FLIPTOP (MISCELLANEOUS) IMPLANT
SUT SILK 2 0 PERMA HAND 18 BK (SUTURE) IMPLANT
SYR GAUGE ALLIANCE SINGLE USE (MISCELLANEOUS) ×1 IMPLANT
TOWEL OR 17X24 6PK STRL BLUE (TOWEL DISPOSABLE) ×4 IMPLANT
TUBE CONNECTING 12X1/4 (SUCTIONS) ×2 IMPLANT
TUBE TRACH SHILE 4 NONFEN UNCU (TUBING) ×1 IMPLANT
WATER STERILE IRR 1000ML POUR (IV SOLUTION) ×2 IMPLANT

## 2018-01-24 NOTE — Anesthesia Procedure Notes (Signed)
Procedure Name: Intubation Date/Time: 01/24/2018 11:12 AM Performed by: Carney Living, CRNA Pre-anesthesia Checklist: Patient identified, Emergency Drugs available, Suction available, Patient being monitored and Timeout performed Patient Re-evaluated:Patient Re-evaluated prior to induction Oxygen Delivery Method: Circle system utilized Preoxygenation: Pre-oxygenation with 100% oxygen Induction Type: IV induction and Tracheostomy Laser Tube: Laser Tube Tube size: 6.0 mm Number of attempts: 1 Airway Equipment and Method: Tracheostomy Placement Confirmation: breath sounds checked- equal and bilateral and positive ETCO2 Tube secured with: Tape Dental Injury: Teeth and Oropharynx as per pre-operative assessment  Comments: 6.0 laser ETT placed through existing tracheostomy by Dr. Redmond Baseman, VSS

## 2018-01-24 NOTE — Anesthesia Preprocedure Evaluation (Addendum)
Anesthesia Evaluation  Patient identified by MRN, date of birth, ID band Patient awake    Reviewed: Allergy & Precautions, NPO status , Patient's Chart, lab work & pertinent test results  Airway Mallampati: II  TM Distance: >3 FB     Dental  (+) Teeth Intact, Dental Advisory Given   Pulmonary pneumonia,    breath sounds clear to auscultation       Cardiovascular hypertension, + Peripheral Vascular Disease and +CHF   Rhythm:Regular Rate:Normal     Neuro/Psych Anxiety Depression    GI/Hepatic Neg liver ROS, hiatal hernia, GERD  Medicated and Controlled,  Endo/Other  Hypothyroidism   Renal/GU Renal disease     Musculoskeletal  (+) Arthritis , Osteoarthritis,  Fibromyalgia -  Abdominal   Peds  Hematology   Anesthesia Other Findings   Reproductive/Obstetrics                           Anesthesia Physical Anesthesia Plan  ASA: III  Anesthesia Plan: General   Post-op Pain Management:    Induction: Intravenous  PONV Risk Score and Plan: Ondansetron, Dexamethasone and Midazolam  Airway Management Planned: Tracheostomy  Additional Equipment:   Intra-op Plan:   Post-operative Plan: Extubation in OR  Informed Consent: I have reviewed the patients History and Physical, chart, labs and discussed the procedure including the risks, benefits and alternatives for the proposed anesthesia with the patient or authorized representative who has indicated his/her understanding and acceptance.   Dental advisory given  Plan Discussed with: CRNA, Anesthesiologist and Surgeon  Anesthesia Plan Comments:       Anesthesia Quick Evaluation

## 2018-01-24 NOTE — H&P (Signed)
[name] is an 77 y.o. female.   Chief Complaint: Laryngeal stenosis, tracheostomy dependence HPI: 77 year old female with laryngeal stenosis and trach dependence following management of aortic aneurysm and valve replacement.  She underwent laser laryngoscopy 6/7 and returns to the OR for repeat evaluation and possible further management.  Past Medical History:  Diagnosis Date  . ACL tear    right  Dr. Gladstone Pih   . Adenomatous polyp   . Anxiety   . Arthritis   . Ascending aortic aneurysm ([place])    note per chart per Dr Lucianne Lei Tright 4.7 cm 04/15/2015   . B12 deficiency   . Cataracts, both eyes 10/2006  . Chronic bronchitis ([place])   . Depression   . DUB (dysfunctional uterine bleeding) 10/96  . Fibromyalgia   . GERD (gastroesophageal reflux disease)   . Glaucoma    bilaterally  . Helicobacter pylori (H. pylori)   . Hiatal hernia   . History of kidney stones   . History of left shoulder fracture    pt states fell off bed and broke ball in shoulder had rod placed   . Hyperlipidemia   . Hypertension   . Hypothyroidism   . Inspiratory stridor   . Laryngeal stenosis   . Occasional tremors    left arm   . Osteoarthritis   . Osteopenia   . Osteoporosis   . Thyroid cancer ([place])   . Thyroid nodule   . Tracheostomy in place [facility])     Past Surgical History:  Procedure Laterality Date  . AORTIC VALVE REPLACEMENT N/A 07/10/2017   Procedure: AORTIC VALVE REPLACEMENT (AVR);  Surgeon: [name], Collier Salina, MD;  Location: [place];  Service: Open Heart Surgery;  Laterality: N/A;  . APPENDECTOMY    . BUNIONECTOMY  08/1999   right - Dr. Irving Shows   . CHOLECYSTECTOMY  1979  . DILATION AND CURETTAGE OF UTERUS  03/31/95   Dr. Ovid Curd   . DIRECT LARYNGOSCOPY WITH BOTOX INJECTION N/A 12/01/2017   Procedure: SUSPENDED DIRECT MICROLARYNGOSCOPY WITH KENALOG INJECTION, CO2 LASER;  Surgeon: Melida Quitter, MD;  Location: [place];  Service: ENT;  Laterality: N/A;  . EXPLORATION POST OPERATIVE OPEN HEART N/A  07/11/2017   Procedure: EXPLORATION POST OPERATIVE OPEN HEART;  Surgeon: Ivin Poot, MD;  Location: [place];  Service: Open Heart Surgery;  Laterality: N/A;  . EYE SURGERY     also had left cataract removed   . FRACTURE SURGERY    . HEMATOMA EVACUATION N/A 07/11/2017   Procedure: EVACUATION HEMATOMA;  Surgeon: Ivin Poot, MD;  Location: [place];  Service: Open Heart Surgery;  Laterality: N/A;  . HERNIA REPAIR    . NEPHROLITHOTOMY Left 04/07/2017   Procedure: NEPHROLITHOTOMY PERCUTANEOUS WITH SURGEON ACCESS;  Surgeon: Ardis Hughs, MD;  [facility];  Service: Urology;  Laterality: Left;  . ORIF HUMERUS FRACTURE  05/30/2011   Procedure: OPEN REDUCTION INTERNAL FIXATION (ORIF) PROXIMAL HUMERUS FRACTURE;  Surgeon: Augustin Schooling;  Location: [place];  Service: Orthopedics;  Laterality: Left;  open reduction internal fixation of proximal humerus fracture  . PLACEMENT OF CENTRIMAG VENTRICULAR ASSIST DEVICE Right 07/11/2017   Procedure: PLACEMENT OF CENTRIMAG VENTRICULAR ASSIST DEVICE;  Surgeon: Ivin Poot, MD;  Location: [place];  Service: Open Heart Surgery;  Laterality: Right;  . REMOVAL OF CENTRIMAG VENTRICULAR ASSIST DEVICE N/A 07/17/2017   Procedure: REMOVAL OF RVAD WITH PUMP STANDBY;  Surgeon: Ivin Poot, MD;  Location: [place];  Service: Open  Heart Surgery;  Laterality: N/A;  . REPLACEMENT ASCENDING AORTA N/A 07/10/2017   Procedure: REPLACEMENT ASCENDING AORTA;  Surgeon: Ivin Poot, MD;  Location: [place];  Service: Open Heart Surgery;  Laterality: N/A;  . right knee replacement  3/11   Dr. Gladstone Pih  . RIGHT/LEFT HEART CATH AND CORONARY ANGIOGRAPHY N/A 06/13/2017   Procedure: RIGHT/LEFT HEART CATH AND CORONARY ANGIOGRAPHY;  Surgeon: Jolaine Artist, MD;  Location: [place] CV LAB;  Service: Cardiovascular;  Laterality: N/A;  . rt. eye cataract  05/28/07  . TEE WITHOUT CARDIOVERSION N/A 07/10/2017   Procedure: TRANSESOPHAGEAL ECHOCARDIOGRAM (TEE);  Surgeon: [name], Collier Salina, MD;  Location: [place];  Service: Open Heart Surgery;  Laterality: N/A;  . TEE WITHOUT CARDIOVERSION  07/11/2017   Procedure: TRANSESOPHAGEAL ECHOCARDIOGRAM (TEE);  Surgeon: [name], Collier Salina, MD;  Location: [place];  Service: Open Heart Surgery;;  . TEE WITHOUT CARDIOVERSION N/A 07/17/2017   Procedure: TRANSESOPHAGEAL ECHOCARDIOGRAM (TEE);  Surgeon: [name], Collier Salina, MD;  Location: [place];  Service: Open Heart Surgery;  Laterality: N/A;  . THYROID LOBECTOMY Left 07/27/2015   Procedure: LEFT THYROID LOBECTOMY;  Surgeon: Fanny Skates, MD;  [facility];  Service: General;  Laterality: Left;  . THYROIDECTOMY N/A 08/06/2015   Procedure: RIGHT THYROID LOBECTOMY, REIMPLANTATION PARATHYROID;  Surgeon: Fanny Skates, MD;  [facility];  Service: General;  Laterality: N/A;  . VENTRAL HERNIA REPAIR  2/99    Family History  Problem Relation Age of Onset  . Cancer Mother        PANCREATIC   . Osteoporosis Mother   . Hip fracture Mother   . Heart attack Father 63       Died 68  . Heart disease Father   . Arthritis Father   . Hypertension Sister   . Cancer Sister        skin  . Hypertension Sister   . Breast cancer Neg Hx    Social History:  reports that she has never smoked. She has never used smokeless tobacco. She reports that she does not drink alcohol or use drugs.  Allergies:  Allergies  Allergen Reactions  . Actonel [Risedronate] Other (See Comments)    Bone pain and nausea   . Lipitor [Atorvastatin Calcium] Other (See Comments)    myalgias  . Lyrica [Pregabalin] Other (See Comments)    SORES IN MOUTH   . Sibutramine Hcl Monohydrate Other (See Comments)    UNSPECIFIED REACTION   . Alendronate Sodium Nausea Only  . Latex Rash and Other (See Comments)    RA to latex 1982; includes bandaids    . Levofloxacin Nausea And Vomiting  . Meloxicam Other (See Comments)    Vaginal itching     Facility-Administered Medications Prior to Admission  Medication Dose Route  Frequency Provider Last Rate Last Dose  . cyanocobalamin ((VITAMIN B-12)) injection 1,000 mcg  1,000 mcg Intramuscular Q30 days Chipper Herb, MD   1,000 mcg at 12/26/17 1554   Medications Prior to Admission  Medication Sig Dispense Refill  . amoxicillin (AMOXIL) 500 MG capsule Take 4 capsules by mouth as directed. Patient takes 4 capsules at once prior to dental appointments  0  . aspirin 81 MG chewable tablet Chew 81 mg by mouth at bedtime.     . calcium carbonate (OS-CAL - DOSED IN MG OF ELEMENTAL CALCIUM) 1250 (500 Ca) MG tablet Take 1 tablet (500 mg of elemental calcium total) by mouth daily with breakfast. 30 tablet 0  .  clonazePAM (KLONOPIN) 0.25 MG disintegrating tablet TAKE 1 TABLET BY MOUTH 3 TIMES A DAY 90 tablet 1  . clorazepate (TRANXENE) 7.5 MG tablet TAKE 1 TABLET DAILY AS NEEDED (Patient taking differently: TAKE 1 TABLET DAILY AS NEEDED FOR ANXIETY) 30 tablet 2  . furosemide (LASIX) 20 MG tablet Take 20 mg by mouth daily.     Marland Kitchen guaiFENesin (ROBITUSSIN) 100 MG/5ML SOLN Take 5 mLs (100 mg total) by mouth 3 (three) times daily with meals. (Patient taking differently: Take 5 mLs by mouth daily. ) 1200 mL 0  . latanoprost (XALATAN) 0.005 % ophthalmic solution Place 1 drop into both eyes at bedtime. 2.5 mL 12  . levothyroxine (SYNTHROID, LEVOTHROID) 100 MCG tablet Take 100 mcg by mouth See admin instructions. Take one tablet by mouth every other day (alternates doses)    . levothyroxine (SYNTHROID, LEVOTHROID) 112 MCG tablet Take 112 mcg by mouth every other day. Alternates doses    . loratadine (CLARITIN) 10 MG tablet Take 1 tablet (10 mg total) by mouth daily. (Patient taking differently: Take 10 mg by mouth every evening. ) 30 tablet 11  . magnesium oxide (MAG-OX) 400 (241.3 Mg) MG tablet Take 1 tablet (400 mg total) by mouth daily. (Patient taking differently: Take 400 mg by mouth 2 (two) times daily. ) 30 tablet 0  . Multiple Vitamin (MULTIVITAMIN WITH MINERALS) TABS tablet Take 1  tablet by mouth daily.    . potassium chloride SA (K-DUR,KLOR-CON) 20 MEQ tablet Take 20 mEq by mouth. Take 2 tablets by mouth twice daily    . PROCTOZONE-HC 2.5 % rectal cream APPLY RECTALLY 2 TIMES A DAY AS NEEDED 30 g 2  . QUEtiapine (SEROQUEL) 25 MG tablet Take 75 mg by mouth at bedtime. Take 3 tablets by mouth at bedtime     . venlafaxine (EFFEXOR) 37.5 MG tablet Take 1 tablet (37.5 mg total) by mouth 2 (two) times daily. 60 tablet 11  . HYDROcodone-acetaminophen (NORCO/VICODIN) 5-325 MG tablet Take 1-2 tablets by mouth every 6 (six) hours as needed for moderate pain. (Patient not taking: Reported on 01/11/2018) 12 tablet 0    No results found for this or any previous visit (from the past 48 hour(s)). No results found.  Review of Systems  All other systems reviewed and are negative.   Blood pressure (!) 158/63, pulse 81, temperature 98 F (36.7 C), temperature source Oral, resp. rate 20, height 5\' 4"  (1.626 m), weight 146 lb (66.2 kg), SpO2 98 %. Physical Exam  Constitutional: She is oriented to person, place, and time. She appears well-developed and well-nourished. No distress.  HENT:  Head: Normocephalic and atraumatic.  Right Ear: External ear normal.  Left Ear: External ear normal.  Nose: Nose normal.  Mouth/Throat: Oropharynx is clear and moist.  Eyes: Pupils are equal, round, and reactive to light. Conjunctivae and EOM are normal.  Neck: Normal range of motion. Neck supple.  #4 cuffless Shiley trach tube in place with Passy-Muir valve.  Cardiovascular: Normal rate.  Respiratory: Effort normal.  Musculoskeletal: Normal range of motion.  Neurological: She is alert and oriented to person, place, and time. No cranial nerve deficit.  Skin: Skin is warm and dry.  Psychiatric: She has a normal mood and affect. Her behavior is normal. Judgment and thought content normal.     Assessment/Plan Laryngeal stenosis and tracheostomy dependence To OR for SMDL with possible CO2 laser  dilation.  Melida Quitter, MD 01/24/2018, 10:38 AM

## 2018-01-24 NOTE — Brief Op Note (Signed)
01/24/2018  11:54 AM  PATIENT:  [name]  77 y.o. female  PRE-OPERATIVE DIAGNOSIS:  Laryngeal stenosis and tracheostomy dependence  POST-OPERATIVE DIAGNOSIS:  Laryngeal stenosis and tracheostomy dependence  PROCEDURE:  Procedure(s): MICROLARYNGOSCOPY WITH LASER AND BALLOON DILATION (N/A)  SURGEON:  Surgeon(s) and Role:    * Melida Quitter, MD - Primary  PHYSICIAN ASSISTANT:   ASSISTANTS: none   ANESTHESIA:   general  EBL: Minimal  BLOOD ADMINISTERED:none  DRAINS: none   LOCAL MEDICATIONS USED:  NONE  SPECIMEN:  No Specimen  DISPOSITION OF SPECIMEN:  N/A  COUNTS:  YES  TOURNIQUET:  * No tourniquets in log *  DICTATION: .Other Dictation: Dictation Number [id number]  PLAN OF CARE: Discharge to home after PACU  PATIENT DISPOSITION:  PACU - hemodynamically stable.   Delay start of Pharmacological VTE agent (>24hrs) due to surgical blood loss or risk of bleeding: no

## 2018-01-24 NOTE — Op Note (Signed)
NAME: [name], [name] MEDICAL RECORD ZS:[id number] ACCOUNT [id number] DATE OF BIRTH:1940-10-20 FACILITY: [facility] LOCATION: [facility] PHYSICIAN:Saulo Anthis Guido Sander, MD  OPERATIVE REPORT  DATE OF PROCEDURE:  01/24/2018  PREOPERATIVE DIAGNOSIS:  Laryngeal stenosis and tracheostomy dependence.  POSTOPERATIVE DIAGNOSIS:  Laryngeal stenosis and tracheostomy dependence.  PROCEDURE:  Suspended microdirect laryngoscopy with CO2 laser dilation.  SURGEON:  Melida Quitter, MD  ANESTHESIA:  General endotracheal anesthesia.  COMPLICATIONS:  None.  INDICATIONS:  The patient is a 77 year old female who has a stenosis of her larynx and upper trachea related to management of an aortic aneurysm and aortic valve replacement and has been trach dependent since then.  She underwent a microlaryngoscopy in  early June and the subglottic and upper trachea was primarily impacted through laser excision of tissue.  Her vocal cords are immobile due to posterior commissure scarring and a steroid injection at that time did not seem to make any difference.  She  presents back to the operating room today for further management.  FINDINGS:  Evaluation of the glottis reveals no difference from before.  With the vocal cords in a paramedian position on both sides and firmness of the posterior commissure.  The subglottic larynx and upper trachea was somewhat narrowed with some  granulation soft tissue that was partially ablated with the CO2 laser.  This area was dilated.  DESCRIPTION OF PROCEDURE:  The patient was identified in the holding room, informed consent having been obtained including discussion of risks, benefits and alternatives, the patient was brought to the operative suite on the operating table in supine  position.  Anesthesia was induced and the tracheostomy tube was exchanged for a laser safe endotracheal tube.  The eyes were taped clothes and bed was turned 90 degrees from anesthesia.  A tooth guard was placed over  the upper teeth and a Stortz  laryngoscope was inserted into the glottic position and placed in suspension on the Mayo stand using the Lewy arm.  The 0 degree telescope was used to make photographs of the glottis and passed into the subglottis to take photograph as well.  The glottis  was able to be opened enough with a suction tip to visualize the subglottic airway.  The CO2 laser was then used on a setting of 8 watts to ablate tissue anteriorly into the right and the subglottic airway.  After this was completed, the large tracheal  balloon was inserted and the endotracheal tube removed and the balloon was inflated to 7 atmospheres of pressure for about 60 seconds and then deflated.  The endotracheal tube was replaced and the patient ventilated.  The same procedure was then carried  out a second time.  A 0 degree telescope was used to make a photograph of postoperative airway after the second time and before replacing the endotracheal tube.  The second dilation was performed more superiorly and the glottis was able to be spread more  with the balloon dilation and this was evident on the photograph.  The endotracheal tube was repositioned and laryngoscope was taken out of suspension and removed from the patient's mouth, suctioning the airway.  Her tooth guard was removed and the  patient was turned back to anesthesia for wake up.  When she was taking breaths on her own, the endotracheal tube was exchanged for a new #4 cuffless Shiley trach tube.  There was a significant amount of granulation tissue around the trach site as well.   Damp lap pads were placed over the eyes and a damp  towel over the face during laser use.  She was moved to the recovery room in stable condition.  TN/NUANCE  D:01/24/2018 T:01/24/2018 JOB:[id number]

## 2018-01-24 NOTE — Transfer of Care (Signed)
Immediate Anesthesia Transfer of Care Note  Patient: [name]  Procedure(s) Performed: MICROLARYNGOSCOPY WITH LASER AND BALLOON DILATION (N/A )  Patient Location: PACU  Anesthesia Type:General  Level of Consciousness: awake, alert , oriented and patient cooperative  Airway & Oxygen Therapy: Patient Spontanous Breathing and Patient connected to tracheostomy mask oxygen  Post-op Assessment: Report given to RN, Post -op Vital signs reviewed and stable and Patient moving all extremities X 4  Post vital signs: Reviewed and stable  Last Vitals:  Vitals Value Taken Time  BP 160/85 01/24/2018 12:08 PM  Temp 36.1 C 01/24/2018 12:00 PM  Pulse 88 01/24/2018 12:11 PM  Resp 24 01/24/2018 12:11 PM  SpO2 99 % 01/24/2018 12:11 PM  Vitals shown include unvalidated device data.  Last Pain:  Vitals:   01/24/18 1200  TempSrc:   PainSc: 0-No pain         Complications: No apparent anesthesia complications

## 2018-01-25 ENCOUNTER — Encounter (HOSPITAL_COMMUNITY): Payer: Self-pay | Admitting: Otolaryngology

## 2018-01-25 NOTE — Anesthesia Postprocedure Evaluation (Signed)
Anesthesia Post Note  Patient: LIZABETH FELLNER  Procedure(s) Performed: MICROLARYNGOSCOPY WITH LASER AND BALLOON DILATION (N/A )     Patient location during evaluation: PACU Anesthesia Type: General Level of consciousness: awake and alert Pain management: pain level controlled Vital Signs Assessment: post-procedure vital signs reviewed and stable Respiratory status: spontaneous breathing, nonlabored ventilation and respiratory function stable Cardiovascular status: blood pressure returned to baseline and stable Postop Assessment: no apparent nausea or vomiting Anesthetic complications: no    Last Vitals:  Vitals:   01/24/18 1240 01/24/18 1244  BP: (!) 145/88 (!) 164/87  Pulse: 82 90  Resp: 15 17  Temp: (!) 36.1 C   SpO2: 96% 93%    Last Pain:  Vitals:   01/24/18 1244  TempSrc:   PainSc: 0-No pain                 Lynda Rainwater

## 2018-02-01 DIAGNOSIS — Z930 Tracheostomy status: Secondary | ICD-10-CM | POA: Diagnosis not present

## 2018-02-01 DIAGNOSIS — R5381 Other malaise: Secondary | ICD-10-CM | POA: Diagnosis not present

## 2018-02-02 ENCOUNTER — Encounter: Payer: Self-pay | Admitting: Family Medicine

## 2018-02-02 ENCOUNTER — Ambulatory Visit (INDEPENDENT_AMBULATORY_CARE_PROVIDER_SITE_OTHER): Payer: Medicare Other | Admitting: Family Medicine

## 2018-02-02 VITALS — BP 143/84 | HR 99 | Temp 97.5°F | Ht 64.0 in | Wt 147.0 lb

## 2018-02-02 DIAGNOSIS — F329 Major depressive disorder, single episode, unspecified: Secondary | ICD-10-CM

## 2018-02-02 DIAGNOSIS — E876 Hypokalemia: Secondary | ICD-10-CM | POA: Diagnosis not present

## 2018-02-02 DIAGNOSIS — R531 Weakness: Secondary | ICD-10-CM | POA: Diagnosis not present

## 2018-02-02 DIAGNOSIS — I10 Essential (primary) hypertension: Secondary | ICD-10-CM

## 2018-02-02 DIAGNOSIS — F419 Anxiety disorder, unspecified: Secondary | ICD-10-CM

## 2018-02-02 DIAGNOSIS — C73 Malignant neoplasm of thyroid gland: Secondary | ICD-10-CM

## 2018-02-02 DIAGNOSIS — Z930 Tracheostomy status: Secondary | ICD-10-CM | POA: Diagnosis not present

## 2018-02-02 DIAGNOSIS — D692 Other nonthrombocytopenic purpura: Secondary | ICD-10-CM

## 2018-02-02 DIAGNOSIS — E860 Dehydration: Secondary | ICD-10-CM

## 2018-02-02 DIAGNOSIS — R5381 Other malaise: Secondary | ICD-10-CM | POA: Diagnosis not present

## 2018-02-02 DIAGNOSIS — F32A Depression, unspecified: Secondary | ICD-10-CM

## 2018-02-02 NOTE — Progress Notes (Signed)
Subjective:    Patient ID: [name], female    DOB: 1941/06/05, 77 y.o.   MRN: [id number]  HPI Patient here today for 4 week follow up on medication adjustments.  The patient is status post ascending aortic aneurysm repair and complications from this with tracheostomy and electrolyte abnormalities.  She was last seen with severe hypokalemia hypocalcemia and hypomagnesia anemia.  She was sent to the ER where she received fluids and was able to make a trip with her husband to [place] although she did not get out as much as she would like to have she did make the trip.  She is gaining some weight according to her husband she is eating better and taking her 40 mEq of potassium daily.  When she gets anxious and stressed he tries to talk to her and gives her some extra medicine that she has for anxiety on as-needed basis.  When she is in more active or when she is by herself is when she seems to be more anxious.  She had some extra tissue removed around the trach site by the ear nose and throat specialist last week and plans to go back for a recheck in the coming week.  Hopefully sooner then later, the tracheostomy will be closed off.  She recently saw the cardiologist and he is not can see her back for 6 months.  Everything is moving forward and the patient was encouraged to be positive about this.  The patient is eating well and drinking well.  She has a visit coming up soon with a cardiovascular surgeon in about 2 weeks and of course the visit next week with the ear nose and throat specialist.  She is drinking some Dr. Malachi Bonds that has caffeine in it and I did discuss trying to reduce the caffeine is much as possible.   Patient Active Problem List   Diagnosis Date Noted  . Leg swelling 01/17/2018  . Hypokalemia 12/27/2017  . Hypomagnesemia 12/27/2017  . Hypocalcemia 12/27/2017  . Sensory disturbance 12/27/2017  . Tracheostomy status ([place])   . Airway obstruction   . Dyspnea on exertion   .  Stridor   . Acute diastolic congestive heart failure ([place])   . Dysphagia   . Anxiety state   . Debility 08/12/2017  . PAF (paroxysmal atrial fibrillation) ([place])   . Pressure injury of skin 08/04/2017  . Tracheostomy dependence ([place])   . Acute encephalopathy   . HCAP (healthcare-associated pneumonia)   . Acute respiratory failure ([place])   . RVF (right ventricular failure) ([place])   . RVAD (right ventricular assist device) present ([place])   . Cardiogenic shock ([place])   . S/P AVR 07/10/2017  . Nephrolithiasis 04/07/2017  . Vitamin B 12 deficiency 03/01/2016  . Ascending aortic aneurysm ([place]) 08/20/2015  . Thyroid cancer ([place]) 07/30/2015  . Left thyroid nodule 07/27/2015  . Osteoporosis with fracture 04/16/2014  . At high risk for falls 04/16/2014  . Proximal humerus fracture 05/30/2011  . CHEST PAIN, PRECORDIAL 02/26/2010  . Hyperlipidemia 02/23/2010  . Depression 02/23/2010  . GLAUCOMA 02/23/2010  . CATARACTS 02/23/2010  . RHEUMATIC FEVER 02/23/2010  . Essential hypertension 02/23/2010  . MITRAL VALVE PROLAPSE 02/23/2010  . GERD 02/23/2010  . Osteoarthritis 02/23/2010  . Primary fibromyalgia syndrome 02/23/2010   Outpatient Encounter Medications as of 02/02/2018  Medication Sig  . aspirin 81 MG chewable tablet Chew 81 mg by mouth at bedtime.   . calcium carbonate (OS-CAL - DOSED IN MG OF  ELEMENTAL CALCIUM) 1250 (500 Ca) MG tablet Take 1 tablet (500 mg of elemental calcium total) by mouth daily with breakfast.  . clonazePAM (KLONOPIN) 0.25 MG disintegrating tablet TAKE 1 TABLET BY MOUTH 3 TIMES A DAY  . clorazepate (TRANXENE) 7.5 MG tablet TAKE 1 TABLET DAILY AS NEEDED (Patient taking differently: TAKE 1 TABLET DAILY AS NEEDED FOR ANXIETY)  . furosemide (LASIX) 20 MG tablet Take 20 mg by mouth daily.   Marland Kitchen guaiFENesin (ROBITUSSIN) 100 MG/5ML SOLN Take 5 mLs (100 mg total) by mouth 3 (three) times daily with meals. (Patient taking differently: Take 5 mLs by mouth daily. )  . latanoprost  (XALATAN) 0.005 % ophthalmic solution Place 1 drop into both eyes at bedtime.  Marland Kitchen levothyroxine (SYNTHROID, LEVOTHROID) 100 MCG tablet Take 100 mcg by mouth See admin instructions. Take one tablet by mouth every other day (alternates doses)  . levothyroxine (SYNTHROID, LEVOTHROID) 112 MCG tablet Take 112 mcg by mouth every other day. Alternates doses  . loratadine (CLARITIN) 10 MG tablet Take 1 tablet (10 mg total) by mouth daily. (Patient taking differently: Take 10 mg by mouth every evening. )  . magnesium oxide (MAG-OX) 400 (241.3 Mg) MG tablet Take 1 tablet (400 mg total) by mouth daily. (Patient taking differently: Take 400 mg by mouth 2 (two) times daily. )  . Multiple Vitamin (MULTIVITAMIN WITH MINERALS) TABS tablet Take 1 tablet by mouth daily.  . potassium chloride SA (K-DUR,KLOR-CON) 20 MEQ tablet Take 20 mEq by mouth. Take 2 tablets by mouth twice daily  . PROCTOZONE-HC 2.5 % rectal cream APPLY RECTALLY 2 TIMES A DAY AS NEEDED  . QUEtiapine (SEROQUEL) 25 MG tablet Take 75 mg by mouth at bedtime. Take 3 tablets by mouth at bedtime   . venlafaxine (EFFEXOR) 37.5 MG tablet Take 1 tablet (37.5 mg total) by mouth 2 (two) times daily.  Marland Kitchen amoxicillin (AMOXIL) 500 MG capsule Take 4 capsules by mouth as directed. Patient takes 4 capsules at once prior to dental appointments   Facility-Administered Encounter Medications as of 02/02/2018  Medication  . cyanocobalamin ((VITAMIN B-12)) injection 1,000 mcg      Review of Systems  Constitutional: Negative.   HENT: Negative.   Eyes: Negative.   Respiratory: Negative.   Cardiovascular: Negative.   Gastrointestinal: Negative.   Endocrine: Negative.   Genitourinary: Negative.   Musculoskeletal: Negative.   Skin: Negative.   Allergic/Immunologic: Negative.   Neurological: Negative.   Hematological: Negative.   Psychiatric/Behavioral: Negative.        Objective:   Physical Exam  Constitutional: She is oriented to person, place, and time. She  appears well-developed and well-nourished. No distress.  Patient is pleasant gaining weight and looking more like an acting more like her usual self.  HENT:  Head: Normocephalic and atraumatic.  Right Ear: External ear normal.  Left Ear: External ear normal.  Nose: Nose normal.  Mouth/Throat: Oropharynx is clear and moist. No oropharyngeal exudate.  Eyes: Pupils are equal, round, and reactive to light. Conjunctivae and EOM are normal. Right eye exhibits no discharge. Left eye exhibits no discharge. No scleral icterus.  Neck: Normal range of motion. Neck supple. No thyromegaly present.  Trach in place with no adenopathy or thyromegaly  Cardiovascular: Normal rate, regular rhythm and normal heart sounds.  No murmur heard. Heart is regular at 96/min with no edema  Pulmonary/Chest: Effort normal and breath sounds normal. No respiratory distress. She has no wheezes. She has no rales.  Clear anteriorly and posteriorly with good  breath sounds  Abdominal: Soft. Bowel sounds are normal. She exhibits no mass. There is no tenderness.  No liver spleen enlargement abdominal tenderness masses or bruits  Musculoskeletal: She exhibits no edema or tenderness.  Lymphadenopathy:    She has no cervical adenopathy.  Neurological: She is alert and oriented to person, place, and time. She has normal reflexes. No cranial nerve deficit.  Skin: Skin is warm and dry. No rash noted. No pallor.  Some bruising.  Psychiatric: She has a normal mood and affect. Her behavior is normal. Judgment and thought content normal.  Mood affect and behavior are improving and she seems to be much less depressed.  Nursing note and vitals reviewed.    BP (!) 143/84 (BP Location: Left Arm)   Pulse 99   Temp (!) 97.5 F (36.4 C) (Oral)   Ht 5\' 4"  (1.626 m)   Wt 147 lb (66.7 kg)   BMI 25.23 kg/m       Assessment & Plan:  1. Dehydration -Continue to drink plenty of fluids and stay well-hydrated  2. Decreased potassium in  the blood -Continue current dose of potassium pending results of blood work.  3. Weakness -Patient still uses a cane for ambulation but she seems to be moving better and getting stronger.  4. Hypokalemia -Check BMP  5. Hypocalcemia -Check BMP  6. Essential hypertension -Blood pressure is good today and slightly elevated for the systolic reading and we will continue to monitor this.  7. Anxiety and depression -Continue current treatment regimen  8. Thyroid cancer ([place]) -Follow-up with specialist as planned  9. Senile purpura ([place]) -Still has some bruises on arms.  Patient Instructions  We will call with lab work results as soon as these results become available Continue to follow-up with ear nose and throat specialist Continue to push yourself and try to be more active and physically strengthen yourself through increased activity Drink more water Avoid caffeine Follow-up with vascular surgeon as planned Follow-up with cardiologist as planned Take potassium is currently doing Work through anxiety reactions by talking with husband and taking extra medicine as he sees necessary. Remember you are improving and getting better and this is a spent a long process but things are improving and try to be positive in your attitude. Reduce caffeine intake is much as possible  Arrie Senate MD

## 2018-02-02 NOTE — Patient Instructions (Addendum)
We will call with lab work results as soon as these results become available Continue to follow-up with ear nose and throat specialist Continue to push yourself and try to be more active and physically strengthen yourself through increased activity Drink more water Avoid caffeine Follow-up with vascular surgeon as planned Follow-up with cardiologist as planned Take potassium is currently doing Work through anxiety reactions by talking with husband and taking extra medicine as he sees necessary. Remember you are improving and getting better and this is a spent a long process but things are improving and try to be positive in your attitude. Reduce caffeine intake is much as possible

## 2018-02-03 LAB — BMP8+EGFR
BUN/Creatinine Ratio: 8 — ABNORMAL LOW (ref 12–28)
BUN: 6 mg/dL — AB (ref 8–27)
CALCIUM: 9 mg/dL (ref 8.7–10.3)
CO2: 29 mmol/L (ref 20–29)
CREATININE: 0.73 mg/dL (ref 0.57–1.00)
Chloride: 96 mmol/L (ref 96–106)
GFR calc Af Amer: 93 mL/min/{1.73_m2} (ref >59)
GFR calc non Af Amer: 81 mL/min/{1.73_m2} (ref >59)
GLUCOSE: 76 mg/dL (ref 65–99)
Potassium: 3.6 mmol/L (ref 3.5–5.2)
Sodium: 140 mmol/L (ref 134–144)

## 2018-02-07 DIAGNOSIS — J386 Stenosis of larynx: Secondary | ICD-10-CM | POA: Diagnosis not present

## 2018-02-07 DIAGNOSIS — R061 Stridor: Secondary | ICD-10-CM | POA: Diagnosis not present

## 2018-02-07 DIAGNOSIS — Z930 Tracheostomy status: Secondary | ICD-10-CM | POA: Diagnosis not present

## 2018-02-14 ENCOUNTER — Ambulatory Visit (HOSPITAL_COMMUNITY)
Admission: RE | Admit: 2018-02-14 | Discharge: 2018-02-14 | Disposition: A | Discharge status: Home / Self Care | Payer: Medicare Other | Source: Ambulatory Visit | Attending: Acute Care | Admitting: Acute Care

## 2018-02-14 DIAGNOSIS — Z4682 Encounter for fitting and adjustment of non-vascular catheter: Secondary | ICD-10-CM | POA: Insufficient documentation

## 2018-02-14 DIAGNOSIS — Z430 Encounter for attention to tracheostomy: Secondary | ICD-10-CM | POA: Insufficient documentation

## 2018-02-14 DIAGNOSIS — R1310 Dysphagia, unspecified: Secondary | ICD-10-CM

## 2018-02-14 DIAGNOSIS — J386 Stenosis of larynx: Secondary | ICD-10-CM | POA: Diagnosis not present

## 2018-02-14 DIAGNOSIS — Z930 Tracheostomy status: Secondary | ICD-10-CM

## 2018-02-14 NOTE — Progress Notes (Signed)
Tracheostomy Procedure Note  [name] [id number] [birth year]  Pre Procedure Tracheostomy Information  Trach Brand: Shiley Size: 4.0 Style: Uncuffed Secured by: Velcro   Procedure: trach change    Post Procedure Tracheostomy Information  Trach Brand: Shiley Size: 4.0 Style: Uncuffed Secured by: Velcro   Post Procedure Evaluation:  ETCO2 positive color change from yellow to purple : Yes.   Vital signs:blood pressure 143/88, pulse 83, respirations 15 and pulse oximetry 100 % Patients current condition: stable Complications: No apparent complications Trach site exam: clean, dry Wound care done: dry and 4 x 4 gauze Patient did tolerate procedure well.   Education: none  Prescription needs: none    Additional needs: Call pt in 6 weeks to set appointment in trach is still in place

## 2018-02-14 NOTE — Progress Notes (Addendum)
[facility]   Reason for visit:  Routine trach care  HPI:  This is a pleasant 77 year old female that I follow for tracheostomy management. She is trach dependent in setting of laryngeal stenosis for which I referred her to Dr Redmond Baseman given inability to decannulate. Since then she is now s/p microdirect laryngoscopy, laser glottic tissue and balloon dilation on 7/31. She has since seen Dr Redmond Baseman who has instructed her to start trach occlusion trails. She reports today for routine trach change.  ROS  General: No fever, chills, body aches or malaise HEENT: No headache, nasal congestion, sore throat, trach stoma drainage or pain.  Pulmonary: No shortness of breath.  Does have an occasional cough, at times with p.o. intake.  No significant mucus production.  No chest pain.  No wheezing.  Cardiac: No chest pain.  No significant swelling.  No orthopnea or palpitations.  Abdomen: No nausea vomiting or diarrhea.  Does have some trouble with different consistencies of p.o. intake specifically crumbly foods, dry grits, toast.  These things often are associated with coughing.  Extremities/musculoskeletal: No pain, no focal weakness.  No joint discomfort.  GU: Deferred neuro: No headache, gait disturbance, memory changes.  Psych: Does have occasional episodes in the afternoon where she finds herself agitated.  This typically resolves spontaneously however it seems to happen every evening. Vital signs:  Heart rate 83 respirations 15 blood pressure 143/88 saturations 100% on room air Exam:  General: This is a pleasant 77 year old female she is currently sitting up in chair.  She is in no acute distress. HEENT normocephalic atraumatic.  Her phonation quality is excellent with Passy-Muir valve.  I also examined her with her occlusion technique at which time she denied shortness of breath, her phonation quality remained excellent, however there was faint turbulent airflow appreciated by  stethoscope evaluation over the neck Pulmonary: Clear to auscultation no accessory use Cardiac: Regular rate and rhythm Abdomen: Soft nontender Extremities: Brisk cap refill trace lower extremity edema strong pulses Neuro: Awake oriented no focal deficits Trach change/procedure: The existing size 4 cuffless Shiley tracheostomy was removed.  The tracheostomy stoma was inspected.  The stoma was unremarkable with the exception of a small amount of granulation tissue which occupied from approximately 6 AM to 9 PM and orientation to the tracheostomy stoma.  This was nonpainful, non-erythemic.  There is no discharge or odor from the tracheostomy stoma.  Following evaluation stoma site was premedicated with lidocaine viscous jelly, and a size 4 cuffless tracheostomy was replaced without difficulty.  End-tidal CO2 was positive patient tolerated well      Impression/dx  Tracheostomy dependent in the setting of laryngeal stenosis  Possible oral dysphasia Intermittent anxiety Discussion  Ardella seems to be making improvement in regards to her airway management.  She can now tolerate finger occlusion and occlusive red tracheostomy.  Her exam does suggest she may still have some degree of occlusion as evidenced by turbulent upper airway noises during finger occlusion however this is a remarkable improvement when comparing prior exam.  I will defer decannulation to Dr. Redmond Baseman. Mattia does describe to me what sounds like intermittent dysphasia.  It seems to occur primarily with certain consistency food.  She had no pulmonary infections over no evidence of aspiration pneumonia.  That being said she has noticed a couple incidences where she feels as though after eating grits she is cough this at her tracheostomy.  She also noticed coughing with certain consistencies as noted above.  We discussed certain nonpharmacologic interventions to help mediate this specifically I instructed her to avoid crumbly foods, avoid toast,  rice, anything that can have multiple particles.  In regards to liquid intake I have encouraged a chin tuck, and a secondary swallow.  We have discussed if this continues to happen we should consider formal swallowing evaluation but as she seems to have thrived since discharge I think we can forego this for now.  Plan  Continue routine tracheostomy care Our office will contact her in 6 weeks, if she is decannulated there is no need for her to come back to the tracheostomy clinic Continue chin tuck, secondary swallow, and avoiding certain consistency foods. I have encouraged her to discuss her anxiety concerns with her primary care provider.  Remains on Seroquel every at bedtime, this may be a place to try to taper off.  I wonder about polypharmacy in general for her.  Visit time: 32 minutes.   Erick Colace ACNP-BC [place]

## 2018-02-20 ENCOUNTER — Telehealth: Payer: Self-pay

## 2018-02-20 NOTE — Telephone Encounter (Signed)
Left message for Laverna Peace or Kiley to call us back.  Called Stu at [facility] and gave him direction.

## 2018-02-20 NOTE — Telephone Encounter (Signed)
Stu at [facility] calling to confirm patient's potassium is to be taken two tablets twice daily.  Please advise.

## 2018-02-20 NOTE — Telephone Encounter (Signed)
The last potassium on this patient was done on August 6 and it was good at 3.6.  The record says she is taking 20 mEq 2 twice daily which I agree is a lot of potassium.  Please call the patient and have her come and recheck a BMP today so that will come back by tomorrow.  In the meantime confirm with her husband how she has been taking the potassium.  I would only take 20 mEq tonight and 20 in the morning prior to having the potassium rechecked in the morning.  Please call the pharmacist and tell him that we are rechecking her potassium based on our record and his questioning.  Until the results are back we will just have her take 20 mEq twice daily.

## 2018-02-21 ENCOUNTER — Encounter: Payer: Self-pay | Admitting: Cardiothoracic Surgery

## 2018-02-21 ENCOUNTER — Telehealth: Payer: Self-pay | Admitting: Family Medicine

## 2018-02-21 ENCOUNTER — Ambulatory Visit (INDEPENDENT_AMBULATORY_CARE_PROVIDER_SITE_OTHER): Payer: Medicare Other | Admitting: Cardiothoracic Surgery

## 2018-02-21 VITALS — BP 127/85 | HR 88 | Resp 20 | Ht 64.0 in | Wt 146.0 lb

## 2018-02-21 DIAGNOSIS — Z952 Presence of prosthetic heart valve: Secondary | ICD-10-CM

## 2018-02-21 DIAGNOSIS — Z9889 Other specified postprocedural states: Secondary | ICD-10-CM | POA: Diagnosis not present

## 2018-02-21 MED ORDER — POTASSIUM CHLORIDE ER 10 MEQ PO TBCR: 20.0000 meq | EXTENDED_RELEASE_TABLET | Freq: Two times a day (BID) | ORAL | 11 refills | Status: DC

## 2018-02-21 NOTE — Progress Notes (Signed)
PCP is Chipper Herb, MD Referring Provider is Chipper Herb, MD  Chief Complaint  Patient presents with  . Routine Post Op    3 month f/u HX of AVR, REPLACEMENT ASCENDING AORTA    HPI: 75-month follow-up for review of progress. History of AVR and replacement of a 5.2 cm ascending aneurysm January 2019.  She is making progress with her respiratory status.  She underwent a second direct laryngoscopy with CO2 laser of granulation tissue in the subglottic region above the trach.  She is now able to ventilate adequately with the trach plugged for up to 2 hours at a time.  She underwent a echocardiogram in June which showed normal LV function, normal valvular function, and mild-moderate RV dysfunction [TAPSE 14} with moderate TR She is in sinus rhythm.  She is much more active.  She can walk now without her cane and goes out to her garden regularly.  She will return in 1 year postop for CTA to assess her thoracic aortic repair.  Past Medical History:  Diagnosis Date  . ACL tear    right  Dr. Gladstone Pih   . Adenomatous polyp   . Anxiety   . Arthritis   . Ascending aortic aneurysm ([place])    note per chart per Dr Lucianne Lei Tright 4.7 cm 04/15/2015   . B12 deficiency   . Cataracts, both eyes 10/2006  . Chronic bronchitis ([place])   . Depression   . DUB (dysfunctional uterine bleeding) 10/96  . Fibromyalgia   . GERD (gastroesophageal reflux disease)   . Glaucoma    bilaterally  . Helicobacter pylori (H. pylori)   . Hiatal hernia   . History of kidney stones   . History of left shoulder fracture    pt states fell off bed and broke ball in shoulder had rod placed   . Hyperlipidemia   . Hypertension   . Hypothyroidism   . Inspiratory stridor   . Laryngeal stenosis   . Occasional tremors    left arm   . Osteoarthritis   . Osteopenia   . Osteoporosis   . Thyroid cancer ([place])   . Thyroid nodule   . Tracheostomy in place [facility])     Past Surgical History:  Procedure Laterality Date  . AORTIC  VALVE REPLACEMENT N/A 07/10/2017   Procedure: AORTIC VALVE REPLACEMENT (AVR);  Surgeon: [name], Collier Salina, MD;  Location: [place];  Service: Open Heart Surgery;  Laterality: N/A;  . APPENDECTOMY    . BUNIONECTOMY  08/1999   right - Dr. Irving Shows   . CHOLECYSTECTOMY  1979  . DILATION AND CURETTAGE OF UTERUS  03/31/95   Dr. Ovid Curd   . DIRECT LARYNGOSCOPY WITH BOTOX INJECTION N/A 12/01/2017   Procedure: SUSPENDED DIRECT MICROLARYNGOSCOPY WITH KENALOG INJECTION, CO2 LASER;  Surgeon: Melida Quitter, MD;  Location: [place];  Service: ENT;  Laterality: N/A;  . EXPLORATION POST OPERATIVE OPEN HEART N/A 07/11/2017   Procedure: EXPLORATION POST OPERATIVE OPEN HEART;  Surgeon: Ivin Poot, MD;  Location: [place];  Service: Open Heart Surgery;  Laterality: N/A;  . EYE SURGERY     also had left cataract removed   . FRACTURE SURGERY    . HEMATOMA EVACUATION N/A 07/11/2017   Procedure: EVACUATION HEMATOMA;  Surgeon: Ivin Poot, MD;  Location: [place];  Service: Open Heart Surgery;  Laterality: N/A;  . HERNIA REPAIR    . MICROLARYNGOSCOPY WITH LASER AND BALLOON DILATION N/A 01/24/2018   Procedure: MICROLARYNGOSCOPY WITH LASER AND BALLOON  DILATION;  Surgeon: Melida Quitter, MD;  Location: [facility];  Service: ENT;  Laterality: N/A;  . NEPHROLITHOTOMY Left 04/07/2017   Procedure: NEPHROLITHOTOMY PERCUTANEOUS WITH SURGEON ACCESS;  Surgeon: Ardis Hughs, MD;  Location: [facility];  Service: Urology;  Laterality: Left;  . ORIF HUMERUS FRACTURE  05/30/2011   Procedure: OPEN REDUCTION INTERNAL FIXATION (ORIF) PROXIMAL HUMERUS FRACTURE;  Surgeon: Augustin Schooling;  Location: [place];  Service: Orthopedics;  Laterality: Left;  open reduction internal fixation of proximal humerus fracture  . PLACEMENT OF CENTRIMAG VENTRICULAR ASSIST DEVICE Right 07/11/2017   Procedure: PLACEMENT OF CENTRIMAG VENTRICULAR ASSIST DEVICE;  Surgeon: Ivin Poot, MD;  Location: [place];  Service: Open Heart Surgery;  Laterality: Right;  . REMOVAL OF  CENTRIMAG VENTRICULAR ASSIST DEVICE N/A 07/17/2017   Procedure: REMOVAL OF RVAD WITH PUMP STANDBY;  Surgeon: Ivin Poot, MD;  Location: [place];  Service: Open Heart Surgery;  Laterality: N/A;  . REPLACEMENT ASCENDING AORTA N/A 07/10/2017   Procedure: REPLACEMENT ASCENDING AORTA;  Surgeon: Ivin Poot, MD;  Location: [place];  Service: Open Heart Surgery;  Laterality: N/A;  . right knee replacement  3/11   Dr. Gladstone Pih  . RIGHT/LEFT HEART CATH AND CORONARY ANGIOGRAPHY N/A 06/13/2017   Procedure: RIGHT/LEFT HEART CATH AND CORONARY ANGIOGRAPHY;  Surgeon: Jolaine Artist, MD;  Location: [place] [facility];  Service: Cardiovascular;  Laterality: N/A;  . rt. eye cataract  05/28/07  . TEE WITHOUT CARDIOVERSION N/A 07/10/2017   Procedure: TRANSESOPHAGEAL ECHOCARDIOGRAM (TEE);  Surgeon: [name], Collier Salina, MD;  Location: [place];  Service: Open Heart Surgery;  Laterality: N/A;  . TEE WITHOUT CARDIOVERSION  07/11/2017   Procedure: TRANSESOPHAGEAL ECHOCARDIOGRAM (TEE);  Surgeon: [name], Collier Salina, MD;  Location: [place];  Service: Open Heart Surgery;;  . TEE WITHOUT CARDIOVERSION N/A 07/17/2017   Procedure: TRANSESOPHAGEAL ECHOCARDIOGRAM (TEE);  Surgeon: [name], Collier Salina, MD;  Location: [place];  Service: Open Heart Surgery;  Laterality: N/A;  . THYROID LOBECTOMY Left 07/27/2015   Procedure: LEFT THYROID LOBECTOMY;  Surgeon: Fanny Skates, MD;  [facility];  Service: General;  Laterality: Left;  . THYROIDECTOMY N/A 08/06/2015   Procedure: RIGHT THYROID LOBECTOMY, REIMPLANTATION PARATHYROID;  Surgeon: Fanny Skates, MD;  Location: [facility];  Service: General;  Laterality: N/A;  . VENTRAL HERNIA REPAIR  2/99    Family History  Problem Relation Age of Onset  . Cancer Mother        PANCREATIC   . Osteoporosis Mother   . Hip fracture Mother   . Heart attack Father 63       Died 68  . Heart disease Father   . Arthritis Father   . Hypertension Sister   . Cancer Sister        skin  . Hypertension  Sister   . Breast cancer Neg Hx     Social History Social History   Tobacco Use  . Smoking status: Never Smoker  . Smokeless tobacco: Never Used  Substance Use Topics  . Alcohol use: No  . Drug use: No    Current Outpatient Medications  Medication Sig Dispense Refill  . amoxicillin (AMOXIL) 500 MG capsule Take 4 capsules by mouth as directed. Patient takes 4 capsules at once prior to dental appointments  0  . aspirin 81 MG chewable tablet Chew 81 mg by mouth at bedtime.     . calcium carbonate (OS-CAL - DOSED IN MG OF ELEMENTAL CALCIUM) 1250 (500 Ca) MG tablet Take 1  tablet (500 mg of elemental calcium total) by mouth daily with breakfast. 30 tablet 0  . clonazePAM (KLONOPIN) 0.25 MG disintegrating tablet TAKE 1 TABLET BY MOUTH 3 TIMES A DAY 90 tablet 1  . clorazepate (TRANXENE) 7.5 MG tablet TAKE 1 TABLET DAILY AS NEEDED (Patient taking differently: TAKE 1 TABLET DAILY AS NEEDED FOR ANXIETY) 30 tablet 2  . furosemide (LASIX) 20 MG tablet Take 20 mg by mouth daily.     Marland Kitchen guaiFENesin (ROBITUSSIN) 100 MG/5ML SOLN Take 5 mLs (100 mg total) by mouth 3 (three) times daily with meals. (Patient taking differently: Take 5 mLs by mouth daily. ) 1200 mL 0  . latanoprost (XALATAN) 0.005 % ophthalmic solution Place 1 drop into both eyes at bedtime. 2.5 mL 12  . levothyroxine (SYNTHROID, LEVOTHROID) 100 MCG tablet Take 100 mcg by mouth See admin instructions. Take one tablet by mouth every other day (alternates doses)    . levothyroxine (SYNTHROID, LEVOTHROID) 112 MCG tablet Take 112 mcg by mouth every other day. Alternates doses    . loratadine (CLARITIN) 10 MG tablet Take 1 tablet (10 mg total) by mouth daily. (Patient taking differently: Take 10 mg by mouth every evening. ) 30 tablet 11  . magnesium oxide (MAG-OX) 400 (241.3 Mg) MG tablet Take 1 tablet (400 mg total) by mouth daily. (Patient taking differently: Take 400 mg by mouth 2 (two) times daily. ) 30 tablet 0  . Multiple Vitamin  (MULTIVITAMIN WITH MINERALS) TABS tablet Take 1 tablet by mouth daily.    . potassium chloride (K-DUR) 10 MEQ tablet Take 2 tablets (20 mEq total) by mouth 2 (two) times daily. 120 tablet 11  . PROCTOZONE-HC 2.5 % rectal cream APPLY RECTALLY 2 TIMES A DAY AS NEEDED 30 g 2  . QUEtiapine (SEROQUEL) 25 MG tablet Take 75 mg by mouth at bedtime. Take 3 tablets by mouth at bedtime     . venlafaxine (EFFEXOR) 37.5 MG tablet Take 1 tablet (37.5 mg total) by mouth 2 (two) times daily. 60 tablet 11   Current Facility-Administered Medications  Medication Dose Route Frequency Provider Last Rate Last Dose  . cyanocobalamin ((VITAMIN B-12)) injection 1,000 mcg  1,000 mcg Intramuscular Q30 days Chipper Herb, MD   1,000 mcg at 12/26/17 1554    Allergies  Allergen Reactions  . Actonel [Risedronate] Other (See Comments)    Bone pain and nausea   . Lipitor [Atorvastatin Calcium] Other (See Comments)    myalgias  . Lyrica [Pregabalin] Other (See Comments)    SORES IN MOUTH   . Sibutramine Hcl Monohydrate Other (See Comments)    UNSPECIFIED REACTION   . Alendronate Sodium Nausea Only  . Latex Rash and Other (See Comments)    RA to latex 1982; includes bandaids    . Levofloxacin Nausea And Vomiting  . Meloxicam Other (See Comments)    Vaginal itching     Review of Systems   Breathing problems at night are improving Swallowing problems are improving Maintaining weight No edema   BP 127/85   Pulse 88   Resp 20   Ht 5\' 4"  (1.626 m)   Wt 146 lb (66.2 kg)   SpO2 97% Comment: RA  BMI 25.06 kg/m  Physical Exam Tracheostomy in place with good air movement Lungs clear Soft systolic flow murmur through aortic valve Sternum well-healed Heart rate regular No edema  Diagnostic Tests: None  Impression: Cardiac status stable after AVR and replacement with a 5.2 cm ascending aneurysm  Plan: Patient  return in November with CTA to assess thoracic aortic repair.  Echocardiogram shows good AVR  function, normal LV function, mild-moderate RV dysfunction with moderate TR.  She will be seen again by Dr. Redmond Baseman to evaluate for possible decannulation of tracheostomy later this fall.   Len Childs, MD [facility] [phone]

## 2018-02-21 NOTE — Telephone Encounter (Signed)
Dup note  

## 2018-02-21 NOTE — Telephone Encounter (Signed)
Only taking 20 meq in am and 20 in the om = 40 a day , NOT 80 MEG a day.  Spoke with Husband and pharm - she will rck BMP next at appt

## 2018-02-28 ENCOUNTER — Other Ambulatory Visit: Payer: Self-pay | Admitting: Family Medicine

## 2018-03-01 NOTE — Telephone Encounter (Signed)
Last seen 02/02/18

## 2018-03-02 ENCOUNTER — Ambulatory Visit: Payer: Medicare Other | Admitting: Family Medicine

## 2018-03-02 ENCOUNTER — Encounter: Payer: Self-pay | Admitting: Family Medicine

## 2018-03-02 VITALS — BP 127/75 | HR 84 | Temp 97.1°F | Ht 64.0 in | Wt 147.0 lb

## 2018-03-02 DIAGNOSIS — E860 Dehydration: Secondary | ICD-10-CM

## 2018-03-02 DIAGNOSIS — F418 Other specified anxiety disorders: Secondary | ICD-10-CM

## 2018-03-02 DIAGNOSIS — E538 Deficiency of other specified B group vitamins: Secondary | ICD-10-CM | POA: Diagnosis not present

## 2018-03-02 DIAGNOSIS — E876 Hypokalemia: Secondary | ICD-10-CM | POA: Diagnosis not present

## 2018-03-02 NOTE — Patient Instructions (Signed)
Continue to follow-up with cardiology as planned Continue to follow-up with cardiovascular surgery as planned Continue to follow-up with Dr. Redmond Baseman, ear nose and throat as planned Continue to drink plenty of fluids and stay well-hydrated Be careful and not moved too quickly so as not to fall and use your cane regularly

## 2018-03-02 NOTE — Progress Notes (Signed)
 Subjective:    Patient ID: [name], female    DOB: 02/10/1941, 77 y.o.   MRN: [id number]  HPI Pt here for 4 week follow up on weakness and dehydration. She is feeling much better.  The patient today is getting better.  We discussed weaning her from the clonazepam and they will do this gradually.  She is gradually working at getting off the trach and back to her regular breathing.  She is doing this with the help of Dr. Bates.  She denies any chest pain pressure tightness or shortness of breath more than would be expected.  She denies any trouble with her intestinal tract including nausea vomiting diarrhea blood in the stool or black tarry bowel movements.  She is passing her water without problems.  She is being careful to move slowly so as not to fall.  She is more positive with her attitude.    Patient Active Problem List   Diagnosis Date Noted  . Senile purpura (HCC) 02/02/2018  . Leg swelling 01/17/2018  . Hypokalemia 12/27/2017  . Hypomagnesemia 12/27/2017  . Hypocalcemia 12/27/2017  . Sensory disturbance 12/27/2017  . Tracheostomy status (HCC)   . Airway obstruction   . Dyspnea on exertion   . Stridor   . Acute diastolic congestive heart failure (HCC)   . Dysphagia   . Anxiety and depression   . Weakness 08/12/2017  . PAF (paroxysmal atrial fibrillation) (HCC)   . Pressure injury of skin 08/04/2017  . Tracheostomy dependence (HCC)   . Acute encephalopathy   . HCAP (healthcare-associated pneumonia)   . Acute respiratory failure (HCC)   . RVF (right ventricular failure) (HCC)   . RVAD (right ventricular assist device) present (HCC)   . Cardiogenic shock (HCC)   . S/P AVR 07/10/2017  . Nephrolithiasis 04/07/2017  . Vitamin B 12 deficiency 03/01/2016  . Ascending aortic aneurysm (HCC) 08/20/2015  . Thyroid cancer (HCC) 07/30/2015  . Left thyroid nodule 07/27/2015  . Osteoporosis with fracture 04/16/2014  . At high risk for falls 04/16/2014  . Proximal humerus  fracture 05/30/2011  . CHEST PAIN, PRECORDIAL 02/26/2010  . Hyperlipidemia 02/23/2010  . Depression 02/23/2010  . GLAUCOMA 02/23/2010  . CATARACTS 02/23/2010  . RHEUMATIC FEVER 02/23/2010  . Essential hypertension 02/23/2010  . MITRAL VALVE PROLAPSE 02/23/2010  . GERD 02/23/2010  . Osteoarthritis 02/23/2010  . Primary fibromyalgia syndrome 02/23/2010   Outpatient Encounter Medications as of 03/02/2018  Medication Sig  . aspirin 81 MG chewable tablet Chew 81 mg by mouth at bedtime.   . calcium carbonate (OS-CAL - DOSED IN MG OF ELEMENTAL CALCIUM) 1250 (500 Ca) MG tablet Take 1 tablet (500 mg of elemental calcium total) by mouth daily with breakfast.  . clonazePAM (KLONOPIN) 0.25 MG disintegrating tablet TAKE 1 TABLET BY MOUTH 3 TIMES A DAY  . clorazepate (TRANXENE) 7.5 MG tablet TAKE 1 TABLET DAILY AS NEEDED (Patient taking differently: TAKE 1 TABLET DAILY AS NEEDED FOR ANXIETY)  . furosemide (LASIX) 20 MG tablet Take 20 mg by mouth daily.   . guaiFENesin (ROBITUSSIN) 100 MG/5ML SOLN Take 5 mLs (100 mg total) by mouth 3 (three) times daily with meals. (Patient taking differently: Take 5 mLs by mouth daily. )  . latanoprost (XALATAN) 0.005 % ophthalmic solution Place 1 drop into both eyes at bedtime.  . levothyroxine (SYNTHROID, LEVOTHROID) 100 MCG tablet Take 100 mcg by mouth See admin instructions. Take one tablet by mouth every other day (alternates doses)  . levothyroxine (SYNTHROID,   LEVOTHROID) 112 MCG tablet Take 112 mcg by mouth every other day. Alternates doses  . loratadine (CLARITIN) 10 MG tablet Take 1 tablet (10 mg total) by mouth daily. (Patient taking differently: Take 10 mg by mouth every evening. )  . magnesium oxide (MAG-OX) 400 (241.3 Mg) MG tablet Take 1 tablet (400 mg total) by mouth daily. (Patient taking differently: Take 400 mg by mouth 2 (two) times daily. )  . Multiple Vitamin (MULTIVITAMIN WITH MINERALS) TABS tablet Take 1 tablet by mouth daily.  . potassium chloride  (K-DUR) 10 MEQ tablet Take 2 tablets (20 mEq total) by mouth 2 (two) times daily.  . PROCTOZONE-HC 2.5 % rectal cream APPLY RECTALLY 2 TIMES A DAY AS NEEDED  . QUEtiapine (SEROQUEL) 25 MG tablet Take 75 mg by mouth at bedtime. Take 3 tablets by mouth at bedtime   . venlafaxine (EFFEXOR) 37.5 MG tablet Take 1 tablet (37.5 mg total) by mouth 2 (two) times daily.  . [DISCONTINUED] amoxicillin (AMOXIL) 500 MG capsule Take 4 capsules by mouth as directed. Patient takes 4 capsules at once prior to dental appointments  . [DISCONTINUED] furosemide (LASIX) 40 MG tablet Take 1 tablet by mouth daily.   Facility-Administered Encounter Medications as of 03/02/2018  Medication  . cyanocobalamin ((VITAMIN B-12)) injection 1,000 mcg      Review of Systems  Constitutional: Negative.   HENT: Negative.   Eyes: Negative.   Respiratory: Negative.   Cardiovascular: Negative.   Gastrointestinal: Negative.   Endocrine: Negative.   Genitourinary: Negative.   Musculoskeletal: Negative.   Skin: Negative.   Allergic/Immunologic: Negative.   Neurological: Negative.   Hematological: Negative.   Psychiatric/Behavioral: Negative.        Objective:   Physical Exam  Constitutional: She is oriented to person, place, and time. She appears well-developed and well-nourished.  Getting better and more like her old self.  She still has trach in place.  HENT:  Head: Normocephalic and atraumatic.  Right Ear: External ear normal.  Left Ear: External ear normal.  Nose: Nose normal.  Mouth/Throat: Oropharynx is clear and moist. No oropharyngeal exudate.  Eyes: Pupils are equal, round, and reactive to light. Conjunctivae and EOM are normal. Right eye exhibits no discharge. Left eye exhibits no discharge. No scleral icterus.  Neck: Normal range of motion. Neck supple. No thyromegaly present.  Cardiovascular: Normal rate and regular rhythm.  Murmur heard. Heart is regular at 72/min with systolic ejection murmur    Pulmonary/Chest: Effort normal and breath sounds normal. She has no wheezes. She has no rales.  Clear anteriorly and posteriorly  Musculoskeletal: She exhibits no edema.  Uses cane for ambulation and gait stability  Lymphadenopathy:    She has no cervical adenopathy.  Neurological: She is alert and oriented to person, place, and time. She has normal reflexes.  Skin: Skin is warm and dry. No rash noted.  Psychiatric: She has a normal mood and affect. Her behavior is normal. Judgment and thought content normal.  Mood affect and behavior are normal for this patient  Nursing note and vitals reviewed.   BP 127/75 (BP Location: Left Arm)   Pulse 84   Temp (!) 97.1 F (36.2 C) (Oral)   Ht 5' 4" (1.626 m)   Wt 147 lb (66.7 kg)   BMI 25.23 kg/m        Assessment & Plan:  1. Decreased potassium in the blood -Currently taking 10 mEq 4 times daily. -We will continue to monitor this closely. - BMP8+EGFR    2.  Anxiety -She will continue with the clonazepam and gradually make efforts to wean off of this. -The patient says she is less anxious than she has been in the past and this is good.  3.  Dehydration -She will make all efforts at keeping herself well-hydrated and replace fluids with any kind of viral illness.  4.  Hypocalcemia -Check BMP  Patient Instructions  Continue to follow-up with cardiology as planned Continue to follow-up with cardiovascular surgery as planned Continue to follow-up with Dr. Redmond Baseman, ear nose and throat as planned Continue to drink plenty of fluids and stay well-hydrated Be careful and not moved too quickly so as not to fall and use your cane regularly   Arrie Senate MD

## 2018-03-03 LAB — BMP8+EGFR
BUN/Creatinine Ratio: 8 — ABNORMAL LOW (ref 12–28)
BUN: 7 mg/dL — ABNORMAL LOW (ref 8–27)
CALCIUM: 9.1 mg/dL (ref 8.7–10.3)
CHLORIDE: 97 mmol/L (ref 96–106)
CO2: 30 mmol/L — AB (ref 20–29)
Creatinine, Ser: 0.86 mg/dL (ref 0.57–1.00)
GFR calc Af Amer: 76 mL/min/{1.73_m2} (ref >59)
GFR calc non Af Amer: 66 mL/min/{1.73_m2} (ref >59)
GLUCOSE: 65 mg/dL (ref 65–99)
Potassium: 3.6 mmol/L (ref 3.5–5.2)
Sodium: 145 mmol/L — ABNORMAL HIGH (ref 134–144)

## 2018-03-04 DIAGNOSIS — R5381 Other malaise: Secondary | ICD-10-CM | POA: Diagnosis not present

## 2018-03-04 DIAGNOSIS — Z930 Tracheostomy status: Secondary | ICD-10-CM | POA: Diagnosis not present

## 2018-03-05 DIAGNOSIS — R5381 Other malaise: Secondary | ICD-10-CM | POA: Diagnosis not present

## 2018-03-05 DIAGNOSIS — Z930 Tracheostomy status: Secondary | ICD-10-CM | POA: Diagnosis not present

## 2018-03-06 ENCOUNTER — Other Ambulatory Visit: Payer: Self-pay | Admitting: Family Medicine

## 2018-03-07 ENCOUNTER — Other Ambulatory Visit: Payer: Self-pay | Admitting: Family Medicine

## 2018-03-08 NOTE — Telephone Encounter (Signed)
Last seen 03/02/18  DWM

## 2018-03-12 ENCOUNTER — Ambulatory Visit (INDEPENDENT_AMBULATORY_CARE_PROVIDER_SITE_OTHER): Payer: Medicare Other | Admitting: *Deleted

## 2018-03-12 ENCOUNTER — Encounter: Payer: Self-pay | Admitting: *Deleted

## 2018-03-12 VITALS — BP 144/79 | HR 88 | Ht 62.0 in | Wt 150.0 lb

## 2018-03-12 DIAGNOSIS — Z0000 Encounter for general adult medical examination without abnormal findings: Secondary | ICD-10-CM | POA: Diagnosis not present

## 2018-03-12 DIAGNOSIS — Z1211 Encounter for screening for malignant neoplasm of colon: Secondary | ICD-10-CM

## 2018-03-12 NOTE — Progress Notes (Addendum)
Subjective:   [name] is a 77 y.o. female who presents for a Medicare Annual Wellness Visit. [name] lives at home with her husband. She has one daughter that lives locally. Her mother's sister comes over to help some during the week and comes over to clean on Saturdays. Her husband is also very attentive and helps out with any care needs that she has. She's having some trouble with balance is using a cane and they have had a ramp built so that she doesn't have to walk up the porch steps.    Review of Systems    Patient reports that her overall health is better compared to last year. Her health is better now that it has been over the past year. Cardiac Risk Factors include: advanced age (>56men, >41 women);dyslipidemia;hypertension. Open heart surgery 06/2017  For aortic valve insufficiency and aortic aneurysm.   HEENT: tracheostomy due to laryngeal stenosis. Hopes to have it removed soon.   Skin: skin tear to right forearm from someone else's fingernail at a restaurant. Has kept the area clean and dry and applies antibiotic ointment and a bandage regularly. No drainage, redness, pain, or swelling.   All other systems negative       Current Medications (verified) Outpatient Encounter Medications as of 03/12/2018  Medication Sig  . aspirin 81 MG chewable tablet Chew 81 mg by mouth at bedtime.   . calcium carbonate (OS-CAL - DOSED IN MG OF ELEMENTAL CALCIUM) 1250 (500 Ca) MG tablet Take 1 tablet (500 mg of elemental calcium total) by mouth daily with breakfast.  . clonazePAM (KLONOPIN) 0.25 MG disintegrating tablet TAKE 1 TABLET BY MOUTH 3 TIMES A DAY  . clorazepate (TRANXENE) 7.5 MG tablet TAKE 1 TABLET DAILY AS NEEDED  . furosemide (LASIX) 20 MG tablet Take 20 mg by mouth daily.   Marland Kitchen guaiFENesin (ROBITUSSIN) 100 MG/5ML SOLN Take 5 mLs (100 mg total) by mouth 3 (three) times daily with meals. (Patient taking differently: Take 5 mLs by mouth daily. )  . latanoprost (XALATAN) 0.005 %  ophthalmic solution Place 1 drop into both eyes at bedtime.  Marland Kitchen levothyroxine (SYNTHROID, LEVOTHROID) 100 MCG tablet Take 100 mcg by mouth See admin instructions. Take one tablet by mouth every other day (alternates doses)  . levothyroxine (SYNTHROID, LEVOTHROID) 112 MCG tablet Take 112 mcg by mouth every other day. Alternates doses  . loratadine (CLARITIN) 10 MG tablet Take 1 tablet (10 mg total) by mouth daily. (Patient taking differently: Take 10 mg by mouth every evening. )  . magnesium oxide (MAG-OX) 400 (241.3 Mg) MG tablet Take 1 tablet (400 mg total) by mouth daily. (Patient taking differently: Take 400 mg by mouth 2 (two) times daily. )  . Multiple Vitamin (MULTIVITAMIN WITH MINERALS) TABS tablet Take 1 tablet by mouth daily.  . potassium chloride (K-DUR) 10 MEQ tablet Take 2 tablets (20 mEq total) by mouth 2 (two) times daily.  Marland Kitchen PROCTOZONE-HC 2.5 % rectal cream APPLY RECTALLY 2 TIMES A DAY AS NEEDED  . QUEtiapine (SEROQUEL) 25 MG tablet Take 75 mg by mouth at bedtime. Take 3 tablets by mouth at bedtime   . QUEtiapine (SEROQUEL) 25 MG tablet TAKE 3 TABLETS AT BEDTIME AS DIRECTED  . venlafaxine (EFFEXOR) 37.5 MG tablet Take 1 tablet (37.5 mg total) by mouth 2 (two) times daily.   Facility-Administered Encounter Medications as of 03/12/2018  Medication  . cyanocobalamin ((VITAMIN B-12)) injection 1,000 mcg    Allergies (verified) Actonel [risedronate]; Lipitor [atorvastatin calcium]; Lyrica [  pregabalin]; Sibutramine hcl monohydrate; Alendronate sodium; Latex; Levofloxacin; and Meloxicam   History: Past Medical History:  Diagnosis Date  . ACL tear    right  Dr. Gladstone Pih   . Adenomatous polyp   . Anxiety   . Arthritis   . Ascending aortic aneurysm ([place])    note per chart per Dr Lucianne Lei Tright 4.7 cm 04/15/2015   . B12 deficiency   . Cataracts, both eyes 10/2006  . Chronic bronchitis ([place])   . Depression   . DUB (dysfunctional uterine bleeding) 10/96  . Fibromyalgia   . GERD  (gastroesophageal reflux disease)   . Glaucoma    bilaterally  . Helicobacter pylori (H. pylori)   . Hiatal hernia   . History of kidney stones   . History of left shoulder fracture    pt states fell off bed and broke ball in shoulder had rod placed   . Hyperlipidemia   . Hypertension   . Hypothyroidism   . Inspiratory stridor   . Laryngeal stenosis   . Occasional tremors    left arm   . Osteoarthritis   . Osteopenia   . Osteoporosis   . Thyroid cancer ([place])   . Thyroid nodule   . Tracheostomy in place [facility])    Past Surgical History:  Procedure Laterality Date  . AORTIC VALVE REPLACEMENT N/A 07/10/2017   Procedure: AORTIC VALVE REPLACEMENT (AVR);  Surgeon: [name], Collier Salina, MD;  Location: [place];  Service: Open Heart Surgery;  Laterality: N/A;  . APPENDECTOMY    . BUNIONECTOMY  08/1999   right - Dr. Irving Shows   . CHOLECYSTECTOMY  1979  . DILATION AND CURETTAGE OF UTERUS  03/31/95   Dr. Ovid Curd   . DIRECT LARYNGOSCOPY WITH BOTOX INJECTION N/A 12/01/2017   Procedure: SUSPENDED DIRECT MICROLARYNGOSCOPY WITH KENALOG INJECTION, CO2 LASER;  Surgeon: Melida Quitter, MD;  Location: [place];  Service: ENT;  Laterality: N/A;  . EXPLORATION POST OPERATIVE OPEN HEART N/A 07/11/2017   Procedure: EXPLORATION POST OPERATIVE OPEN HEART;  Surgeon: Ivin Poot, MD;  Location: [place];  Service: Open Heart Surgery;  Laterality: N/A;  . EYE SURGERY     also had left cataract removed   . FRACTURE SURGERY    . HEMATOMA EVACUATION N/A 07/11/2017   Procedure: EVACUATION HEMATOMA;  Surgeon: Ivin Poot, MD;  Location: [place];  Service: Open Heart Surgery;  Laterality: N/A;  . HERNIA REPAIR    . MICROLARYNGOSCOPY WITH LASER AND BALLOON DILATION N/A 01/24/2018   Procedure: MICROLARYNGOSCOPY WITH LASER AND BALLOON DILATION;  Surgeon: Melida Quitter, MD;  Location: [facility];  Service: ENT;  Laterality: N/A;  . NEPHROLITHOTOMY Left 04/07/2017   Procedure: NEPHROLITHOTOMY PERCUTANEOUS WITH SURGEON ACCESS;  Surgeon:  Ardis Hughs, MD;  Location: [facility];  Service: Urology;  Laterality: Left;  . ORIF HUMERUS FRACTURE  05/30/2011   Procedure: OPEN REDUCTION INTERNAL FIXATION (ORIF) PROXIMAL HUMERUS FRACTURE;  Surgeon: Augustin Schooling;  Location: [place];  Service: Orthopedics;  Laterality: Left;  open reduction internal fixation of proximal humerus fracture  . PLACEMENT OF CENTRIMAG VENTRICULAR ASSIST DEVICE Right 07/11/2017   Procedure: PLACEMENT OF CENTRIMAG VENTRICULAR ASSIST DEVICE;  Surgeon: Ivin Poot, MD;  Location: [place];  Service: Open Heart Surgery;  Laterality: Right;  . REMOVAL OF CENTRIMAG VENTRICULAR ASSIST DEVICE N/A 07/17/2017   Procedure: REMOVAL OF RVAD WITH PUMP STANDBY;  Surgeon: Ivin Poot, MD;  Location: [place];  Service: Open Heart Surgery;  Laterality: N/A;  .  REPLACEMENT ASCENDING AORTA N/A 07/10/2017   Procedure: REPLACEMENT ASCENDING AORTA;  Surgeon: Ivin Poot, MD;  Location: [place];  Service: Open Heart Surgery;  Laterality: N/A;  . right knee replacement  3/11   Dr. Gladstone Pih  . RIGHT/LEFT HEART CATH AND CORONARY ANGIOGRAPHY N/A 06/13/2017   Procedure: RIGHT/LEFT HEART CATH AND CORONARY ANGIOGRAPHY;  Surgeon: Jolaine Artist, MD;  Location: [place] [facility];  Service: Cardiovascular;  Laterality: N/A;  . rt. eye cataract  05/28/07  . TEE WITHOUT CARDIOVERSION N/A 07/10/2017   Procedure: TRANSESOPHAGEAL ECHOCARDIOGRAM (TEE);  Surgeon: [name], Collier Salina, MD;  Location: [place];  Service: Open Heart Surgery;  Laterality: N/A;  . TEE WITHOUT CARDIOVERSION  07/11/2017   Procedure: TRANSESOPHAGEAL ECHOCARDIOGRAM (TEE);  Surgeon: [name], Collier Salina, MD;  Location: [place];  Service: Open Heart Surgery;;  . TEE WITHOUT CARDIOVERSION N/A 07/17/2017   Procedure: TRANSESOPHAGEAL ECHOCARDIOGRAM (TEE);  Surgeon: [name], Collier Salina, MD;  Location: [place];  Service: Open Heart Surgery;  Laterality: N/A;  . THYROID LOBECTOMY Left 07/27/2015   Procedure: LEFT THYROID LOBECTOMY;  Surgeon:  Fanny Skates, MD;  [facility];  Service: General;  Laterality: Left;  . THYROIDECTOMY N/A 08/06/2015   Procedure: RIGHT THYROID LOBECTOMY, REIMPLANTATION PARATHYROID;  Surgeon: Fanny Skates, MD;  [facility];  Service: General;  Laterality: N/A;  . VENTRAL HERNIA REPAIR  2/99   Family History  Problem Relation Age of Onset  . Cancer Mother        PANCREATIC   . Osteoporosis Mother   . Hip fracture Mother   . Heart attack Father 63       Died 68  . Heart disease Father   . Arthritis Father   . Hypertension Sister   . Cancer Sister        skin  . Hypertension Sister   . Cancer Sister        breast cancer  . Breast cancer Neg Hx    Social History   Socioeconomic History  . Marital status: Married    Spouse name: Not on file  . Number of children: 1  . Years of education: 71  . Highest education level: 11th grade  Occupational History  . Occupation: homemaker  Social Needs  . Financial resource strain: Not hard at all  . Food insecurity:    Worry: Never true    Inability: Never true  . Transportation needs:    Medical: No    Non-medical: No  Tobacco Use  . Smoking status: Never Smoker  . Smokeless tobacco: Never Used  Substance and Sexual Activity  . Alcohol use: No  . Drug use: No  . Sexual activity: Not Currently    Birth control/protection: Abstinence  Lifestyle  . Physical activity:    Days per week: 0 days    Minutes per session: 0 min  . Stress: Only a little  Relationships  . Social connections:    Talks on phone: More than three times a week    Gets together: More than three times a week    Attends religious service: Never    Active member of club or organization: No    Attends meetings of clubs or organizations: Never    Relationship status: Married  Other Topics Concern  . Not on file  Social History Narrative   Lives with husband.     Tobacco Use No.  Clinical Intake:     Pain : No/denies pain     Nutritional  Status:  BMI 25 -29 Overweight Diabetes: No  How often do you need to have someone help you when you read instructions, pamphlets, or other written materials from your doctor or pharmacy?: 1 - Never What is the last grade level you completed in school?: 11th  Interpreter Needed?: No  Information entered by :: Chong Sicilian, RN   Activities of Daily Living In your present state of health, do you have any difficulty performing the following activities: 03/12/2018 01/18/2018  Hearing? N N  Vision? N N  Difficulty concentrating or making decisions? N N  Walking or climbing stairs? Y Y  Comment uses cane when she's outside of the house -  Dressing or bathing? N N  Doing errands, shopping? Y N  Comment husband helps  -  Conservation officer, nature and eating ? N -  Using the Toilet? N -  In the past six months, have you accidently leaked urine? N -  Do you have problems with loss of bowel control? N -  Managing your Medications? Y -  Comment Husband organizes her medications the night before -  Managing your Finances? N -  Housekeeping or managing your Housekeeping? N -  Some recent data might be hidden     Diet Eats a late breakfast and later lunch and may eat a snack at supper Combination of eating at home and eating out Drinks about 16 oz of coke a day and water in the evening    Exercise Current Exercise Habits: Home exercise routine, Type of exercise: walking, Time (Minutes): 30, Frequency (Times/Week): 4, Weekly Exercise (Minutes/Week): 120, Intensity: Mild, Exercise limited by: orthopedic condition(s);respiratory conditions(s)   Depression Screen PHQ 2/9 Scores 03/12/2018 03/02/2018 02/02/2018 12/27/2017 12/26/2017 11/28/2017 10/26/2017  PHQ - 2 Score 2 0 1 1 0 0 1  PHQ- 9 Score 4 - - - - - -     Fall Risk Fall Risk  03/02/2018 02/02/2018 12/27/2017 12/26/2017 11/28/2017  Falls in the past year? No No No No No  Number falls in past yr: - - - - -  Injury with Fall? - - - - -  Comment - - - - -  Risk Factor  Category  - - - - -  Risk for fall due to : - - - - -    Safety Is the patient's home free of loose throw rugs in walkways, pet beds, electrical cords, etc?   yes      Grab bars in the bathroom? yes      Walkin shower? yes      Shower Seat? yes      Handrails on the stairs?   yes      Adequate lighting?   yes  Patient Care Team: Chipper Herb, MD as PCP - General (Family Medicine) Steffanie Rainwater, DPM as Consulting Physician (Podiatry) Minus Breeding, MD as Consulting Physician (Cardiology) Netta Cedars, MD as Consulting Physician (Orthopedic Surgery) Clarene Essex, MD as Consulting Physician (Gastroenterology) Irine Seal, MD as Attending Physician (Urology) Latanya Maudlin, MD as Consulting Physician (Orthopedic Surgery) Harlen Labs, MD as Referring Physician (Optometry)  Hospitalizations, surgeries, and ER visits in previous 12 months Multiple hospitalizations and surgeries over the past year. Notes available in [facility].   Objective:    Today's Vitals   03/12/18 1513  BP: (!) 144/79  Pulse: 88  Weight: 150 lb (68 kg)  Height: 5\' 2"  (1.575 m)   Body mass index is 27.44 kg/m.  Advanced Directives 01/18/2018 12/27/2017 11/28/2017  08/12/2017 08/05/2017 07/06/2017 06/13/2017  Does Patient Have a Medical Advance Directive? No No No No No No No  Type of Advance Directive - - - - - - -  Does patient want to make changes to medical advance directive? - - - - - - -  Copy of [place] in Chart? - - - - - - -  Would patient like information on creating a medical advance directive? No - Patient declined No - Patient declined No - Patient declined No - Patient declined No - Patient declined - No - Patient declined  Pre-existing out of facility DNR order (yellow form or pink MOST form) - - - - - - -    Hearing/Vision  No hearing or vision deficits noted during visit.  Cognitive Function: MMSE - Mini Mental State Exam 03/12/2018 12/30/2016  Orientation to time 5 5    Orientation to Place 5 4  Registration 3 3  Attention/ Calculation 4 5  Recall 3 3  Language- name 2 objects 2 2  Language- repeat 1 1  Language- follow 3 step command 3 3  Language- read & follow direction 1 1  Write a sentence 0 0  Write a sentence-comments unable to write or draw due to hand trembling -  Copy design 0 1  Copy design-comments unable to write due to hand tremor -  Total score 27 28       Normal Cognitive Function Screening: Yes    Immunizations and Health Maintenance Immunization History  Administered Date(s) Administered  . Influenza,inj,Quad PF,6+ Mos 03/27/2013, 04/01/2014, 03/31/2015, 03/22/2016  . Pneumococcal Conjugate-13 11/15/2013  . Pneumococcal Polysaccharide-23 09/06/2007  . Td 01/17/2014   Health Maintenance Due  Topic Date Due  . COLON CANCER SCREENING ANNUAL FOBT  07/22/2017   Health Maintenance  Topic Date Due  . COLON CANCER SCREENING ANNUAL FOBT  07/22/2017  . INFLUENZA VACCINE  04/03/2018 (Originally 01/25/2018)  . COLONOSCOPY  03/13/2019 (Originally 05/27/2017)  . TETANUS/TDAP  01/18/2024  . DEXA SCAN  Completed  . PNA vac Low Risk Adult  Completed        Assessment:   This is a routine wellness examination for [place].    Plan:    Goals    . Exercise 150 minutes per week (moderate activity)     Walk for 30 minutes daily        Health Maintenance Recommendations: Influenza vaccine Colorectal cancer screening   Additional Screening Recommendations: Lung: Low Dose CT Chest recommended if Age 62-80 years, 30 pack-year currently smoking OR have quit w/in 15years. Patient does not qualify. Hepatitis C Screening recommended: no  Today's Orders Orders Placed This Encounter  Procedures  . Fecal occult blood, imunochemical    Standing Status:   Future    Standing Expiration Date:   04/05/2018    Keep f/u with Chipper Herb, MD and any other specialty appointments you may have Continue current medications Move  carefully to avoid falls. Continue to use a cane at all times.  Aim for at least 150 minutes of moderate activity a week. This can be done with chair exercises if necessary. Read or work on puzzles daily Stay connected with friends and family  I have personally reviewed and noted the following in the patient's chart:   . Medical and social history . Use of alcohol, tobacco or illicit drugs  . Current medications and supplements . Functional ability and status . Nutritional status . Physical activity . Advanced directives .  List of other physicians . Hospitalizations, surgeries, and ER visits in previous 12 months . Vitals . Screenings to include cognitive, depression, and falls . Referrals and appointments  In addition, I have reviewed and discussed with patient certain preventive protocols, quality metrics, and best practice recommendations. A written personalized care plan for preventive services as well as general preventive health recommendations were provided to patient.     Chong Sicilian, RN   03/12/2018    I have reviewed and agree with the above AWV documentation.   Evelina Dun, FNP

## 2018-03-12 NOTE — Patient Instructions (Signed)
  [name] , Thank you for taking time to come for your Medicare Wellness Visit. I appreciate your ongoing commitment to your health goals. Please review the following plan we discussed and let me know if I can assist you in the future.   These are the goals we discussed: Goals    . Exercise 150 minutes per week (moderate activity)     Walk for 30 minutes daily       This is a list of the screening recommended for you and due dates:  Health Maintenance  Topic Date Due  . Stool Blood Test  07/22/2017  . Flu Shot  04/03/2018*  . Colon Cancer Screening  03/13/2019*  . Tetanus Vaccine  01/18/2024  . DEXA scan (bone density measurement)  Completed  . Pneumonia vaccines  Completed  *Topic was postponed. The date shown is not the original due date.

## 2018-03-15 DIAGNOSIS — Z930 Tracheostomy status: Secondary | ICD-10-CM | POA: Diagnosis not present

## 2018-03-15 DIAGNOSIS — R061 Stridor: Secondary | ICD-10-CM | POA: Diagnosis not present

## 2018-03-15 DIAGNOSIS — J386 Stenosis of larynx: Secondary | ICD-10-CM | POA: Diagnosis not present

## 2018-03-19 NOTE — Telephone Encounter (Signed)
complete

## 2018-03-26 ENCOUNTER — Other Ambulatory Visit: Payer: Self-pay | Admitting: *Deleted

## 2018-03-26 DIAGNOSIS — Z952 Presence of prosthetic heart valve: Secondary | ICD-10-CM

## 2018-03-26 DIAGNOSIS — I712 Thoracic aortic aneurysm, without rupture, unspecified: Secondary | ICD-10-CM

## 2018-03-26 DIAGNOSIS — Z8679 Personal history of other diseases of the circulatory system: Secondary | ICD-10-CM

## 2018-03-26 DIAGNOSIS — Z9889 Other specified postprocedural states: Secondary | ICD-10-CM

## 2018-03-28 ENCOUNTER — Other Ambulatory Visit: Payer: Medicare Other

## 2018-03-28 ENCOUNTER — Other Ambulatory Visit: Payer: Self-pay | Admitting: *Deleted

## 2018-03-28 DIAGNOSIS — Z1211 Encounter for screening for malignant neoplasm of colon: Secondary | ICD-10-CM

## 2018-03-28 MED ORDER — CLORAZEPATE DIPOTASSIUM 7.5 MG PO TABS: ORAL_TABLET | ORAL | 3 refills | Status: DC

## 2018-03-29 LAB — FECAL OCCULT BLOOD, IMMUNOCHEMICAL: Fecal Occult Bld: NEGATIVE

## 2018-04-03 DIAGNOSIS — R5381 Other malaise: Secondary | ICD-10-CM | POA: Diagnosis not present

## 2018-04-03 DIAGNOSIS — Z930 Tracheostomy status: Secondary | ICD-10-CM | POA: Diagnosis not present

## 2018-04-04 ENCOUNTER — Ambulatory Visit (INDEPENDENT_AMBULATORY_CARE_PROVIDER_SITE_OTHER): Payer: Medicare Other

## 2018-04-04 DIAGNOSIS — Z23 Encounter for immunization: Secondary | ICD-10-CM

## 2018-04-04 DIAGNOSIS — R5381 Other malaise: Secondary | ICD-10-CM | POA: Diagnosis not present

## 2018-04-04 DIAGNOSIS — Z930 Tracheostomy status: Secondary | ICD-10-CM | POA: Diagnosis not present

## 2018-04-05 DIAGNOSIS — H40033 Anatomical narrow angle, bilateral: Secondary | ICD-10-CM | POA: Diagnosis not present

## 2018-04-05 DIAGNOSIS — H2513 Age-related nuclear cataract, bilateral: Secondary | ICD-10-CM | POA: Diagnosis not present

## 2018-04-11 ENCOUNTER — Inpatient Hospital Stay (HOSPITAL_COMMUNITY)
Admission: AD | Admit: 2018-04-11 | Discharge: 2018-04-14 | DRG: 168 | Disposition: A | Discharge status: Home / Self Care | Payer: Medicare Other | Attending: Otolaryngology | Admitting: Otolaryngology

## 2018-04-11 DIAGNOSIS — Z954 Presence of other heart-valve replacement: Secondary | ICD-10-CM

## 2018-04-11 DIAGNOSIS — Z95828 Presence of other vascular implants and grafts: Secondary | ICD-10-CM

## 2018-04-11 DIAGNOSIS — Z85850 Personal history of malignant neoplasm of thyroid: Secondary | ICD-10-CM | POA: Diagnosis not present

## 2018-04-11 DIAGNOSIS — K219 Gastro-esophageal reflux disease without esophagitis: Secondary | ICD-10-CM | POA: Diagnosis present

## 2018-04-11 DIAGNOSIS — Z881 Allergy status to other antibiotic agents status: Secondary | ICD-10-CM | POA: Diagnosis not present

## 2018-04-11 DIAGNOSIS — Z8249 Family history of ischemic heart disease and other diseases of the circulatory system: Secondary | ICD-10-CM | POA: Diagnosis not present

## 2018-04-11 DIAGNOSIS — Z96651 Presence of right artificial knee joint: Secondary | ICD-10-CM | POA: Diagnosis present

## 2018-04-11 DIAGNOSIS — Z87442 Personal history of urinary calculi: Secondary | ICD-10-CM | POA: Diagnosis not present

## 2018-04-11 DIAGNOSIS — H409 Unspecified glaucoma: Secondary | ICD-10-CM | POA: Diagnosis present

## 2018-04-11 DIAGNOSIS — Z79890 Hormone replacement therapy (postmenopausal): Secondary | ICD-10-CM

## 2018-04-11 DIAGNOSIS — J386 Stenosis of larynx: Secondary | ICD-10-CM | POA: Diagnosis present

## 2018-04-11 DIAGNOSIS — E890 Postprocedural hypothyroidism: Secondary | ICD-10-CM | POA: Diagnosis present

## 2018-04-11 DIAGNOSIS — F329 Major depressive disorder, single episode, unspecified: Secondary | ICD-10-CM | POA: Diagnosis present

## 2018-04-11 DIAGNOSIS — Z91040 Latex allergy status: Secondary | ICD-10-CM

## 2018-04-11 DIAGNOSIS — Z79899 Other long term (current) drug therapy: Secondary | ICD-10-CM

## 2018-04-11 DIAGNOSIS — I739 Peripheral vascular disease, unspecified: Secondary | ICD-10-CM | POA: Diagnosis present

## 2018-04-11 DIAGNOSIS — Z888 Allergy status to other drugs, medicaments and biological substances status: Secondary | ICD-10-CM

## 2018-04-11 DIAGNOSIS — M797 Fibromyalgia: Secondary | ICD-10-CM | POA: Diagnosis present

## 2018-04-11 DIAGNOSIS — I10 Essential (primary) hypertension: Secondary | ICD-10-CM | POA: Diagnosis present

## 2018-04-11 DIAGNOSIS — Y838 Other surgical procedures as the cause of abnormal reaction of the patient, or of later complication, without mention of misadventure at the time of the procedure: Secondary | ICD-10-CM | POA: Diagnosis present

## 2018-04-11 DIAGNOSIS — I712 Thoracic aortic aneurysm, without rupture: Secondary | ICD-10-CM | POA: Diagnosis present

## 2018-04-11 DIAGNOSIS — Z9049 Acquired absence of other specified parts of digestive tract: Secondary | ICD-10-CM

## 2018-04-11 DIAGNOSIS — J9503 Malfunction of tracheostomy stoma: Secondary | ICD-10-CM | POA: Diagnosis present

## 2018-04-11 DIAGNOSIS — Z9842 Cataract extraction status, left eye: Secondary | ICD-10-CM

## 2018-04-11 DIAGNOSIS — F419 Anxiety disorder, unspecified: Secondary | ICD-10-CM | POA: Diagnosis present

## 2018-04-11 DIAGNOSIS — K449 Diaphragmatic hernia without obstruction or gangrene: Secondary | ICD-10-CM | POA: Diagnosis present

## 2018-04-11 DIAGNOSIS — M810 Age-related osteoporosis without current pathological fracture: Secondary | ICD-10-CM | POA: Diagnosis present

## 2018-04-11 DIAGNOSIS — E785 Hyperlipidemia, unspecified: Secondary | ICD-10-CM | POA: Diagnosis present

## 2018-04-11 DIAGNOSIS — E538 Deficiency of other specified B group vitamins: Secondary | ICD-10-CM

## 2018-04-11 DIAGNOSIS — Z7982 Long term (current) use of aspirin: Secondary | ICD-10-CM

## 2018-04-11 HISTORY — DX: Unspecified asthma, uncomplicated: J45.909

## 2018-04-11 HISTORY — DX: Personal history of other medical treatment: Z92.89

## 2018-04-11 MED ORDER — LEVOTHYROXINE SODIUM 112 MCG PO TABS
112.0000 ug | ORAL_TABLET | ORAL | Status: DC
Start: 2018-04-11 — End: 2018-04-14
  Filled 2018-04-11: qty 1

## 2018-04-11 MED ORDER — CLORAZEPATE DIPOTASSIUM 3.75 MG PO TABS
7.5000 mg | ORAL_TABLET | Freq: Three times a day (TID) | ORAL | Status: DC | PRN
  Administered 2018-04-11 – 2018-04-12 (×2): 7.5 mg via ORAL
  Filled 2018-04-11 (×2): qty 2

## 2018-04-11 MED ORDER — ASPIRIN 81 MG PO CHEW
81.0000 mg | CHEWABLE_TABLET | Freq: Every day | ORAL | Status: DC
  Administered 2018-04-11 – 2018-04-13 (×3): 81 mg via ORAL
  Filled 2018-04-11 (×3): qty 1

## 2018-04-11 MED ORDER — FUROSEMIDE 20 MG PO TABS
20.0000 mg | ORAL_TABLET | Freq: Every day | ORAL | Status: DC
  Administered 2018-04-12 – 2018-04-14 (×2): 20 mg via ORAL
  Filled 2018-04-11 (×2): qty 1

## 2018-04-11 MED ORDER — LATANOPROST 0.005 % OP SOLN
1.0000 [drp] | Freq: Every day | OPHTHALMIC | Status: DC
  Administered 2018-04-11 – 2018-04-13 (×2): 1 [drp] via OPHTHALMIC
  Filled 2018-04-11: qty 2.5

## 2018-04-11 MED ORDER — LEVOTHYROXINE SODIUM 100 MCG PO TABS
100.0000 ug | ORAL_TABLET | ORAL | Status: DC
Start: 2018-04-12 — End: 2018-04-14
  Administered 2018-04-12 – 2018-04-14 (×2): 100 ug via ORAL
  Filled 2018-04-11 (×2): qty 1

## 2018-04-11 MED ORDER — CYANOCOBALAMIN 1000 MCG/ML IJ SOLN: 1000.0000 ug | INTRAMUSCULAR | Status: DC

## 2018-04-11 MED ORDER — LORATADINE 10 MG PO TABS
10.0000 mg | ORAL_TABLET | Freq: Every day | ORAL | Status: DC
  Administered 2018-04-12 – 2018-04-14 (×2): 10 mg via ORAL
  Filled 2018-04-11 (×2): qty 1

## 2018-04-11 MED ORDER — MAGNESIUM OXIDE 400 (241.3 MG) MG PO TABS
400.0000 mg | ORAL_TABLET | Freq: Every day | ORAL | Status: DC
  Administered 2018-04-12 – 2018-04-14 (×2): 400 mg via ORAL
  Filled 2018-04-11 (×2): qty 1

## 2018-04-11 MED ORDER — VENLAFAXINE HCL 37.5 MG PO TABS
37.5000 mg | ORAL_TABLET | Freq: Two times a day (BID) | ORAL | Status: DC
  Administered 2018-04-11 – 2018-04-14 (×5): 37.5 mg via ORAL
  Filled 2018-04-11 (×5): qty 1

## 2018-04-11 MED ORDER — GUAIFENESIN 100 MG/5ML PO SOLN
5.0000 mL | Freq: Three times a day (TID) | ORAL | Status: DC | PRN
  Administered 2018-04-11 – 2018-04-12 (×4): 100 mg via ORAL
  Filled 2018-04-11 (×4): qty 5

## 2018-04-11 MED ORDER — QUETIAPINE FUMARATE 50 MG PO TABS
75.0000 mg | ORAL_TABLET | Freq: Every day | ORAL | Status: DC
  Administered 2018-04-11 – 2018-04-13 (×3): 75 mg via ORAL
  Filled 2018-04-11 (×3): qty 2

## 2018-04-11 MED ORDER — CLONAZEPAM 0.25 MG PO TBDP
0.2500 mg | ORAL_TABLET | Freq: Three times a day (TID) | ORAL | Status: DC
  Administered 2018-04-11 – 2018-04-14 (×8): 0.25 mg via ORAL
  Filled 2018-04-11 (×8): qty 1

## 2018-04-11 MED ORDER — ADULT MULTIVITAMIN W/MINERALS CH
1.0000 | ORAL_TABLET | Freq: Every day | ORAL | Status: DC
  Administered 2018-04-12 – 2018-04-14 (×2): 1 via ORAL
  Filled 2018-04-11 (×2): qty 1

## 2018-04-11 MED ORDER — CALCIUM CARBONATE 1250 (500 CA) MG PO TABS
1.0000 | ORAL_TABLET | Freq: Every day | ORAL | Status: DC
  Administered 2018-04-12 – 2018-04-14 (×2): 500 mg via ORAL
  Filled 2018-04-11 (×2): qty 1

## 2018-04-11 MED ORDER — POTASSIUM CHLORIDE CRYS ER 10 MEQ PO TBCR
20.0000 meq | EXTENDED_RELEASE_TABLET | Freq: Two times a day (BID) | ORAL | Status: DC
  Administered 2018-04-11 – 2018-04-14 (×5): 20 meq via ORAL
  Filled 2018-04-11 (×8): qty 2

## 2018-04-11 NOTE — H&P (Signed)
[name] is an 77 y.o. female.   Chief Complaint: Laryngeal stenosis, tracheostomy status HPI: 77 year old female with laryngeal stenosis due to prolonged intubation related to cardiac surgery in January.  She required tracheostomy placement at that time but was then unable to tolerate capping for decannulation.  She was found to have posterior glottic stenosis and tracheal stenosis above the trach site and has undergone two dilation procedures in June and again in late July.  She has since been able to tolerate capping of her trach tube all day long every day.  She presents to the hospital for monitored capping and decannulation.  Past Medical History:  Diagnosis Date  . ACL tear    right  Dr. Gladstone Pih   . Adenomatous polyp   . Anxiety   . Arthritis   . Ascending aortic aneurysm ([place])    note per chart per Dr Lucianne Lei Tright 4.7 cm 04/15/2015   . B12 deficiency   . Cataracts, both eyes 10/2006  . Chronic bronchitis ([place])   . Depression   . DUB (dysfunctional uterine bleeding) 10/96  . Fibromyalgia   . GERD (gastroesophageal reflux disease)   . Glaucoma    bilaterally  . Helicobacter pylori (H. pylori)   . Hiatal hernia   . History of kidney stones   . History of left shoulder fracture    pt states fell off bed and broke ball in shoulder had rod placed   . Hyperlipidemia   . Hypertension   . Hypothyroidism   . Inspiratory stridor   . Laryngeal stenosis   . Occasional tremors    left arm   . Osteoarthritis   . Osteopenia   . Osteoporosis   . Thyroid cancer ([place])   . Thyroid nodule   . Tracheostomy in place [facility])     Past Surgical History:  Procedure Laterality Date  . AORTIC VALVE REPLACEMENT N/A 07/10/2017   Procedure: AORTIC VALVE REPLACEMENT (AVR);  Surgeon: [name], Collier Salina, MD;  Location: [place];  Service: Open Heart Surgery;  Laterality: N/A;  . APPENDECTOMY    . BUNIONECTOMY  08/1999   right - Dr. Irving Shows   . CHOLECYSTECTOMY  1979  . DILATION AND CURETTAGE OF UTERUS   03/31/95   Dr. Ovid Curd   . DIRECT LARYNGOSCOPY WITH BOTOX INJECTION N/A 12/01/2017   Procedure: SUSPENDED DIRECT MICROLARYNGOSCOPY WITH KENALOG INJECTION, CO2 LASER;  Surgeon: Melida Quitter, MD;  Location: [place];  Service: ENT;  Laterality: N/A;  . EXPLORATION POST OPERATIVE OPEN HEART N/A 07/11/2017   Procedure: EXPLORATION POST OPERATIVE OPEN HEART;  Surgeon: Ivin Poot, MD;  Location: [place];  Service: Open Heart Surgery;  Laterality: N/A;  . EYE SURGERY     also had left cataract removed   . FRACTURE SURGERY    . HEMATOMA EVACUATION N/A 07/11/2017   Procedure: EVACUATION HEMATOMA;  Surgeon: Ivin Poot, MD;  Location: [place];  Service: Open Heart Surgery;  Laterality: N/A;  . HERNIA REPAIR    . MICROLARYNGOSCOPY WITH LASER AND BALLOON DILATION N/A 01/24/2018   Procedure: MICROLARYNGOSCOPY WITH LASER AND BALLOON DILATION;  Surgeon: Melida Quitter, MD;  Location: [facility];  Service: ENT;  Laterality: N/A;  . NEPHROLITHOTOMY Left 04/07/2017   Procedure: NEPHROLITHOTOMY PERCUTANEOUS WITH SURGEON ACCESS;  Surgeon: Ardis Hughs, MD;  Location: [facility];  Service: Urology;  Laterality: Left;  . ORIF HUMERUS FRACTURE  05/30/2011   Procedure: OPEN REDUCTION INTERNAL FIXATION (ORIF) PROXIMAL HUMERUS FRACTURE;  Surgeon: Remo Lipps  Orlena Sheldon;  Location: [place];  Service: Orthopedics;  Laterality: Left;  open reduction internal fixation of proximal humerus fracture  . PLACEMENT OF CENTRIMAG VENTRICULAR ASSIST DEVICE Right 07/11/2017   Procedure: PLACEMENT OF CENTRIMAG VENTRICULAR ASSIST DEVICE;  Surgeon: Ivin Poot, MD;  Location: [place];  Service: Open Heart Surgery;  Laterality: Right;  . REMOVAL OF CENTRIMAG VENTRICULAR ASSIST DEVICE N/A 07/17/2017   Procedure: REMOVAL OF RVAD WITH PUMP STANDBY;  Surgeon: Ivin Poot, MD;  Location: [place];  Service: Open Heart Surgery;  Laterality: N/A;  . REPLACEMENT ASCENDING AORTA N/A 07/10/2017   Procedure: REPLACEMENT ASCENDING AORTA;  Surgeon: Ivin Poot, MD;  Location: [place];  Service: Open Heart Surgery;  Laterality: N/A;  . right knee replacement  3/11   Dr. Gladstone Pih  . RIGHT/LEFT HEART CATH AND CORONARY ANGIOGRAPHY N/A 06/13/2017   Procedure: RIGHT/LEFT HEART CATH AND CORONARY ANGIOGRAPHY;  Surgeon: Jolaine Artist, MD;  Location: [place] [facility];  Service: Cardiovascular;  Laterality: N/A;  . rt. eye cataract  05/28/07  . TEE WITHOUT CARDIOVERSION N/A 07/10/2017   Procedure: TRANSESOPHAGEAL ECHOCARDIOGRAM (TEE);  Surgeon: [name], Collier Salina, MD;  Location: [place];  Service: Open Heart Surgery;  Laterality: N/A;  . TEE WITHOUT CARDIOVERSION  07/11/2017   Procedure: TRANSESOPHAGEAL ECHOCARDIOGRAM (TEE);  Surgeon: [name], Collier Salina, MD;  Location: [place];  Service: Open Heart Surgery;;  . TEE WITHOUT CARDIOVERSION N/A 07/17/2017   Procedure: TRANSESOPHAGEAL ECHOCARDIOGRAM (TEE);  Surgeon: [name], Collier Salina, MD;  Location: [place];  Service: Open Heart Surgery;  Laterality: N/A;  . THYROID LOBECTOMY Left 07/27/2015   Procedure: LEFT THYROID LOBECTOMY;  Surgeon: Fanny Skates, MD;  Location[facility];  Service: General;  Laterality: Left;  . THYROIDECTOMY N/A 08/06/2015   Procedure: RIGHT THYROID LOBECTOMY, REIMPLANTATION PARATHYROID;  Surgeon: Fanny Skates, MD;  Location[facility];  Service: General;  Laterality: N/A;  . VENTRAL HERNIA REPAIR  2/99    Family History  Problem Relation Age of Onset  . Cancer Mother        PANCREATIC   . Osteoporosis Mother   . Hip fracture Mother   . Heart attack Father 63       Died 68  . Heart disease Father   . Arthritis Father   . Hypertension Sister   . Cancer Sister        skin  . Hypertension Sister   . Cancer Sister        breast cancer  . Breast cancer Neg Hx    Social History:  reports that she has never smoked. She has never used smokeless tobacco. She reports that she does not drink alcohol or use drugs.  Allergies:  Allergies  Allergen Reactions  . Actonel [Risedronate] Other (See  Comments)    Bone pain and nausea   . Lipitor [Atorvastatin Calcium] Other (See Comments)    myalgias  . Lyrica [Pregabalin] Other (See Comments)    SORES IN MOUTH   . Sibutramine Hcl Monohydrate Other (See Comments)    UNSPECIFIED REACTION   . Alendronate Sodium Nausea Only  . Latex Rash and Other (See Comments)    RA to latex 1982; includes bandaids    . Levofloxacin Nausea And Vomiting  . Meloxicam Other (See Comments)    Vaginal itching     Facility-Administered Medications Prior to Admission  Medication Dose Route Frequency Provider Last Rate Last Dose  . cyanocobalamin ((VITAMIN B-12)) injection 1,000 mcg  1,000 mcg Intramuscular  Q30 days Chipper Herb, MD   1,000 mcg at 03/02/18 1502   Medications Prior to Admission  Medication Sig Dispense Refill  . aspirin 81 MG chewable tablet Chew 81 mg by mouth at bedtime.     . calcium carbonate (OS-CAL - DOSED IN MG OF ELEMENTAL CALCIUM) 1250 (500 Ca) MG tablet Take 1 tablet (500 mg of elemental calcium total) by mouth daily with breakfast. 30 tablet 0  . clonazePAM (KLONOPIN) 0.25 MG disintegrating tablet TAKE 1 TABLET BY MOUTH 3 TIMES A DAY 60 tablet 2  . clorazepate (TRANXENE) 7.5 MG tablet Take 0.5 tab TID PRN 45 tablet 3  . furosemide (LASIX) 20 MG tablet Take 20 mg by mouth daily.     Marland Kitchen guaiFENesin (ROBITUSSIN) 100 MG/5ML SOLN Take 5 mLs (100 mg total) by mouth 3 (three) times daily with meals. (Patient taking differently: Take 5 mLs by mouth daily. ) 1200 mL 0  . latanoprost (XALATAN) 0.005 % ophthalmic solution Place 1 drop into both eyes at bedtime. 2.5 mL 12  . levothyroxine (SYNTHROID, LEVOTHROID) 100 MCG tablet Take 100 mcg by mouth See admin instructions. Take one tablet by mouth every other day (alternates doses)    . levothyroxine (SYNTHROID, LEVOTHROID) 112 MCG tablet Take 112 mcg by mouth every other day. Alternates doses    . loratadine (CLARITIN) 10 MG tablet Take 1 tablet (10 mg total) by mouth daily. (Patient  taking differently: Take 10 mg by mouth every evening. ) 30 tablet 11  . magnesium oxide (MAG-OX) 400 (241.3 Mg) MG tablet Take 1 tablet (400 mg total) by mouth daily. (Patient taking differently: Take 400 mg by mouth 2 (two) times daily. ) 30 tablet 0  . Multiple Vitamin (MULTIVITAMIN WITH MINERALS) TABS tablet Take 1 tablet by mouth daily.    . potassium chloride (K-DUR) 10 MEQ tablet Take 2 tablets (20 mEq total) by mouth 2 (two) times daily. 120 tablet 11  . PROCTOZONE-HC 2.5 % rectal cream APPLY RECTALLY 2 TIMES A DAY AS NEEDED 30 g 2  . QUEtiapine (SEROQUEL) 25 MG tablet Take 75 mg by mouth at bedtime. Take 3 tablets by mouth at bedtime     . QUEtiapine (SEROQUEL) 25 MG tablet TAKE 3 TABLETS AT BEDTIME AS DIRECTED 90 tablet 2  . venlafaxine (EFFEXOR) 37.5 MG tablet Take 1 tablet (37.5 mg total) by mouth 2 (two) times daily. 60 tablet 11    No results found for this or any previous visit (from the past 48 hour(s)). No results found.  Review of Systems  All other systems reviewed and are negative.   There were no vitals taken for this visit. Physical Exam  Constitutional: She appears well-developed and well-nourished. No distress.  HENT:  Head: Normocephalic and atraumatic.  Right Ear: External ear normal.  Left Ear: External ear normal.  Nose: Nose normal.  Mouth/Throat: Oropharynx is clear and moist.  Eyes: Pupils are equal, round, and reactive to light. Conjunctivae and EOM are normal.  Neck: Normal range of motion. Neck supple.  Capped #4 Shiley trach in place.  Cardiovascular: Normal rate.  Respiratory: Effort normal.  Very slight inspiratory stridor.  Musculoskeletal: Normal range of motion.  Neurological: She is alert. No cranial nerve deficit.  Skin: Skin is warm and dry.  Psychiatric: She has a normal mood and affect. Her behavior is normal. Judgment and thought content normal.     Assessment/Plan Laryngeal and tracheal stenosis Admit for telemetry observation.   Will leave the trach tube  capped for 24 hours.  If she tolerates that well without respiratory compromise, we will consider decannulation tomorrow.  We may observe one more night after decannulation.  We will likely plan surgical laryngoscopy again in 2-4 weeks for the next dilation.  Melida Quitter, MD 04/11/2018, 11:22 AM

## 2018-04-11 NOTE — Progress Notes (Signed)
   ENT Progress Note: HD#1   Subjective: Stable  Objective: Vital signs in last 24 hours: Temp:  [98.1 F (36.7 C)-98.4 F (36.9 C)] 98.1 F (36.7 C) (10/16 1448) Pulse Rate:  [83-88] 86 (10/16 1505) Resp:  [16-18] 16 (10/16 1505) BP: (130-133)/(63-81) 130/81 (10/16 1448) SpO2:  [97 %-100 %] 100 % (10/16 1505) Weight:  [67.4 kg] 67.4 kg (10/16 1126) Weight change:  Last BM Date: 04/10/18  Intake/Output from previous day: No intake/output data recorded. Intake/Output this shift: No intake/output data recorded.  Labs: No results for input(s): WBC, HGB, HCT, PLT in the last 72 hours. No results for input(s): NA, K, CL, CO2, GLUCOSE, BUN, CALCIUM in the last 72 hours.  Invalid input(s): CREATININR  Studies/Results: No results found.   PHYSICAL EXAM: Capped trach inplace Airway stable     Assessment/Plan: Pt stable with capped trach  Monitor o/n sats    Jerrell Belfast 04/11/2018, 5:43 PM

## 2018-04-12 ENCOUNTER — Encounter (HOSPITAL_COMMUNITY): Payer: Self-pay | Admitting: General Practice

## 2018-04-12 ENCOUNTER — Other Ambulatory Visit: Payer: Self-pay

## 2018-04-12 LAB — SURGICAL PCR SCREEN
MRSA, PCR: NEGATIVE
Staphylococcus aureus: NEGATIVE

## 2018-04-12 MED ORDER — ZOLPIDEM TARTRATE 5 MG PO TABS: 5.0000 mg | ORAL_TABLET | Freq: Every evening | ORAL | Status: DC | PRN

## 2018-04-12 NOTE — Progress Notes (Signed)
   Subjective:    Patient ID: [name], female    DOB: 04-04-1941, 77 y.o.   MRN: [id number]  HPI No reported breathing problems overnight.  Did not sleep well.  Kept the trach tube capped overnight.  Review of Systems     Objective:   Physical Exam AF VSS Alert, NAD Some hoarseness #4 capped cuffless Shiley trach tube in place Long inspiratory phase with stridor    Assessment & Plan:  Laryngeal stenosis, tracheostomy dependence  While she was able to tolerate capping all night, her airway has some obstruction with a prolonged inspiratory phase with some stridor.  I recommended a return trip to the operating room for dilation tomorrow prior to decannulation.  She will then stay overnight one more night.  Risks, benefits, and alternatives were discussed.

## 2018-04-13 ENCOUNTER — Encounter (HOSPITAL_COMMUNITY)
Admission: AD | Disposition: A | Discharge status: Home / Self Care | Payer: Self-pay | Source: Home / Self Care | Attending: Otolaryngology

## 2018-04-13 ENCOUNTER — Inpatient Hospital Stay (HOSPITAL_COMMUNITY): Payer: Medicare Other | Admitting: Certified Registered Nurse Anesthetist

## 2018-04-13 HISTORY — PX: MICROLARYNGOSCOPY WITH LASER AND BALLOON DILATION: SHX5973

## 2018-04-13 LAB — BASIC METABOLIC PANEL
ANION GAP: 7 (ref 5–15)
BUN: 9 mg/dL (ref 8–23)
CALCIUM: 8.8 mg/dL — AB (ref 8.9–10.3)
CO2: 31 mmol/L (ref 22–32)
Chloride: 103 mmol/L (ref 98–111)
Creatinine, Ser: 0.79 mg/dL (ref 0.44–1.00)
Glucose, Bld: 110 mg/dL — ABNORMAL HIGH (ref 70–99)
Potassium: 3.8 mmol/L (ref 3.5–5.1)
Sodium: 141 mmol/L (ref 135–145)

## 2018-04-13 LAB — CBC
HEMATOCRIT: 39 % (ref 36.0–46.0)
Hemoglobin: 12.4 g/dL (ref 12.0–15.0)
MCH: 28.6 pg (ref 26.0–34.0)
MCHC: 31.8 g/dL (ref 30.0–36.0)
MCV: 89.9 fL (ref 80.0–100.0)
NRBC: 0 % (ref 0.0–0.2)
Platelets: 213 10*3/uL (ref 150–400)
RBC: 4.34 MIL/uL (ref 3.87–5.11)
RDW: 12.9 % (ref 11.5–15.5)
WBC: 4.8 10*3/uL (ref 4.0–10.5)

## 2018-04-13 SURGERY — MICROLARYNGOSCOPY WITH LASER AND BALLOON DILATION
Anesthesia: General | Site: Throat

## 2018-04-13 MED ORDER — FENTANYL CITRATE (PF) 250 MCG/5ML IJ SOLN
INTRAMUSCULAR | Status: AC
  Filled 2018-04-13: qty 5

## 2018-04-13 MED ORDER — OXYCODONE HCL 5 MG PO TABS: 5.0000 mg | ORAL_TABLET | Freq: Once | ORAL | Status: DC | PRN

## 2018-04-13 MED ORDER — ONDANSETRON HCL 4 MG/2ML IJ SOLN
INTRAMUSCULAR | Status: DC | PRN
  Administered 2018-04-13: 4 mg via INTRAVENOUS

## 2018-04-13 MED ORDER — PROMETHAZINE HCL 25 MG/ML IJ SOLN: 6.2500 mg | INTRAMUSCULAR | Status: DC | PRN

## 2018-04-13 MED ORDER — ONDANSETRON HCL 4 MG/2ML IJ SOLN
INTRAMUSCULAR | Status: AC
  Filled 2018-04-13: qty 4

## 2018-04-13 MED ORDER — HYDROMORPHONE HCL 1 MG/ML IJ SOLN: 0.2500 mg | INTRAMUSCULAR | Status: DC | PRN

## 2018-04-13 MED ORDER — LACTATED RINGERS IV SOLN
INTRAVENOUS | Status: DC | PRN
  Administered 2018-04-13: 09:00:00 via INTRAVENOUS

## 2018-04-13 MED ORDER — LIDOCAINE 2% (20 MG/ML) 5 ML SYRINGE
INTRAMUSCULAR | Status: DC | PRN
  Administered 2018-04-13: 60 mg via INTRAVENOUS

## 2018-04-13 MED ORDER — ROCURONIUM BROMIDE 10 MG/ML (PF) SYRINGE
PREFILLED_SYRINGE | INTRAVENOUS | Status: DC | PRN
  Administered 2018-04-13: 50 mg via INTRAVENOUS

## 2018-04-13 MED ORDER — EPINEPHRINE PF 1 MG/ML IJ SOLN
INTRAMUSCULAR | Status: DC | PRN
  Administered 2018-04-13: 30 mL

## 2018-04-13 MED ORDER — DEXAMETHASONE SODIUM PHOSPHATE 10 MG/ML IJ SOLN
20.0000 mg | Freq: Once | INTRAMUSCULAR | Status: AC
  Administered 2018-04-13: 20 mg via INTRAVENOUS
  Filled 2018-04-13: qty 2

## 2018-04-13 MED ORDER — LIDOCAINE 2% (20 MG/ML) 5 ML SYRINGE
INTRAMUSCULAR | Status: AC
  Filled 2018-04-13: qty 5

## 2018-04-13 MED ORDER — ROCURONIUM BROMIDE 50 MG/5ML IV SOSY
PREFILLED_SYRINGE | INTRAVENOUS | Status: AC
  Filled 2018-04-13: qty 5

## 2018-04-13 MED ORDER — OXYCODONE HCL 5 MG/5ML PO SOLN: 5.0000 mg | Freq: Once | ORAL | Status: DC | PRN

## 2018-04-13 MED ORDER — DEXAMETHASONE SODIUM PHOSPHATE 10 MG/ML IJ SOLN
INTRAMUSCULAR | Status: DC | PRN
  Administered 2018-04-13: 10 mg via INTRAVENOUS

## 2018-04-13 MED ORDER — PROPOFOL 10 MG/ML IV BOLUS
INTRAVENOUS | Status: DC | PRN
  Administered 2018-04-13: 130 mg via INTRAVENOUS

## 2018-04-13 MED ORDER — EPINEPHRINE HCL (NASAL) 0.1 % NA SOLN
NASAL | Status: AC
  Filled 2018-04-13: qty 30

## 2018-04-13 MED ORDER — DEXAMETHASONE SODIUM PHOSPHATE 10 MG/ML IJ SOLN
INTRAMUSCULAR | Status: AC
  Filled 2018-04-13: qty 2

## 2018-04-13 MED ORDER — 0.9 % SODIUM CHLORIDE (POUR BTL) OPTIME
TOPICAL | Status: DC | PRN
  Administered 2018-04-13: 1000 mL

## 2018-04-13 MED ORDER — SUGAMMADEX SODIUM 200 MG/2ML IV SOLN
INTRAVENOUS | Status: DC | PRN
  Administered 2018-04-13: 200 mg via INTRAVENOUS

## 2018-04-13 MED ORDER — FENTANYL CITRATE (PF) 100 MCG/2ML IJ SOLN
INTRAMUSCULAR | Status: DC | PRN
  Administered 2018-04-13: 100 ug via INTRAVENOUS

## 2018-04-13 SURGICAL SUPPLY — 35 items
BALLN PULM 15 16.5 18X75 (BALLOONS) ×2
BALLN PULMONARY 10-12 (MISCELLANEOUS) IMPLANT
BALLN PULMONARY 8-10 OD75 (MISCELLANEOUS) IMPLANT
BALLOON PULM 15 16.5 18X75 (BALLOONS) IMPLANT
BLADE SURG 15 STRL LF DISP TIS (BLADE) IMPLANT
BLADE SURG 15 STRL SS (BLADE)
CANISTER SUCT 3000ML PPV (MISCELLANEOUS) ×2 IMPLANT
CONT SPEC 4OZ CLIKSEAL STRL BL (MISCELLANEOUS) IMPLANT
COVER BACK TABLE 60X90IN (DRAPES) ×2 IMPLANT
COVER MAYO STAND STRL (DRAPES) ×1 IMPLANT
COVER WAND RF STERILE (DRAPES) ×1 IMPLANT
CRADLE DONUT ADULT HEAD (MISCELLANEOUS) IMPLANT
DRAPE HALF SHEET 40X57 (DRAPES) ×2 IMPLANT
GAUZE 4X4 16PLY RFD (DISPOSABLE) ×1 IMPLANT
GAUZE SPONGE 4X4 12PLY STRL (GAUZE/BANDAGES/DRESSINGS) ×2 IMPLANT
GLOVE BIO SURGEON STRL SZ7.5 (GLOVE) ×1 IMPLANT
GOWN STRL REUS W/ TWL LRG LVL3 (GOWN DISPOSABLE) IMPLANT
GOWN STRL REUS W/TWL LRG LVL3 (GOWN DISPOSABLE) ×2
KIT BASIN OR (CUSTOM PROCEDURE TRAY) ×2 IMPLANT
KIT TURNOVER KIT B (KITS) ×2 IMPLANT
MARKER SKIN DUAL TIP RULER LAB (MISCELLANEOUS) IMPLANT
NDL HYPO 25GX1X1/2 BEV (NEEDLE) IMPLANT
NEEDLE HYPO 25GX1X1/2 BEV (NEEDLE) IMPLANT
NS IRRIG 1000ML POUR BTL (IV SOLUTION) ×2 IMPLANT
PAD ARMBOARD 7.5X6 YLW CONV (MISCELLANEOUS) ×4 IMPLANT
PAD EYE OVAL STERILE LF (GAUZE/BANDAGES/DRESSINGS) IMPLANT
PATTIES SURGICAL .5 X.5 (GAUZE/BANDAGES/DRESSINGS) IMPLANT
PATTIES SURGICAL .5 X3 (DISPOSABLE) ×2 IMPLANT
SOLUTION ANTI FOG 6CC (MISCELLANEOUS) IMPLANT
SURGILUBE 2OZ TUBE FLIPTOP (MISCELLANEOUS) IMPLANT
SUT SILK 2 0 PERMA HAND 18 BK (SUTURE) IMPLANT
SYR GAUGE ALLIANCE SINGLE USE (MISCELLANEOUS) ×1 IMPLANT
TOWEL OR 17X24 6PK STRL BLUE (TOWEL DISPOSABLE) ×4 IMPLANT
TUBE CONNECTING 12X1/4 (SUCTIONS) ×2 IMPLANT
WATER STERILE IRR 1000ML POUR (IV SOLUTION) ×2 IMPLANT

## 2018-04-13 NOTE — Progress Notes (Signed)
Pt back from OR s/p suspended microdirect laryngoscopy with balloon dilation , alert and oriented with shiley trach cuffless, trach collar 28%, family at the bedside, no complain of pain at this time, still on telemetry and continous pulse oxi.

## 2018-04-13 NOTE — Anesthesia Preprocedure Evaluation (Signed)
Anesthesia Evaluation  Patient identified by MRN, date of birth, ID band Patient awake    Reviewed: Allergy & Precautions, NPO status , Patient's Chart, lab work & pertinent test results  Airway Mallampati: II  TM Distance: >3 FB     Dental  (+) Teeth Intact, Dental Advisory Given   Pulmonary pneumonia,    breath sounds clear to auscultation       Cardiovascular hypertension, + Peripheral Vascular Disease and +CHF   Rhythm:Regular Rate:Normal     Neuro/Psych Anxiety Depression    GI/Hepatic Neg liver ROS, hiatal hernia, GERD  Medicated and Controlled,  Endo/Other  Hypothyroidism   Renal/GU Renal disease     Musculoskeletal  (+) Arthritis , Osteoarthritis,  Fibromyalgia -  Abdominal   Peds  Hematology   Anesthesia Other Findings   Reproductive/Obstetrics                             Anesthesia Physical  Anesthesia Plan  ASA: III  Anesthesia Plan: General   Post-op Pain Management:    Induction: Intravenous  PONV Risk Score and Plan: Ondansetron, Dexamethasone and Midazolam  Airway Management Planned: Tracheostomy  Additional Equipment:   Intra-op Plan:   Post-operative Plan: Extubation in OR  Informed Consent: I have reviewed the patients History and Physical, chart, labs and discussed the procedure including the risks, benefits and alternatives for the proposed anesthesia with the patient or authorized representative who has indicated his/her understanding and acceptance.   Dental advisory given  Plan Discussed with: CRNA, Anesthesiologist and Surgeon  Anesthesia Plan Comments:         Anesthesia Quick Evaluation

## 2018-04-13 NOTE — Op Note (Signed)
NAME: [name], [name] MEDICAL RECORD [id number] ACCOUNT [id number] DATE OF BIRTH:06/03/1941 FACILITY: [facility] LOCATION: [facility] PHYSICIAN:Melanni Benway Guido Sander, MD  OPERATIVE REPORT  DATE OF PROCEDURE:  04/13/2018  PREOPERATIVE DIAGNOSIS:  Laryngeal stenosis.  POSTOPERATIVE DIAGNOSIS:  Laryngeal stenosis.  PROCEDURE:  Suspended microdirect laryngoscopy with balloon dilation.  SURGEON:  Melida Quitter, MD  ANESTHESIA:  General endotracheal anesthesia.  COMPLICATIONS:  None.  INDICATIONS:  The patient is a 77 year old female with laryngeal stenosis following prolonged intubation due to complicated cardiac surgery.  She has undergone 2 previous dilation procedures being tracheostomy dependent, but has been able to keep her  trach tube capped all day every day since late July.  She was admitted earlier this week for observation with the tube capped 24 hours a day.  She has still pretty significant inspiratory stridor, so she is brought back to the operating room today for  surgical dilation.  FINDINGS:  There was a small area of granulation tissue to the left in the subglottis that was removed with cup forceps.  The glottis was narrowed by posterior glottic scar.  The larynx and trachea were dilated to 7 atmospheres pressure on the largest  tracheal balloon.  DESCRIPTION OF PROCEDURE:  The patient was identified in the holding room, informed consent having been obtained including discussion of risks, benefits, alternatives.  The patient was brought to the operative suite and put the operating room, put on the  operative table in supine position.  Anesthesia was induced and the patient's trach tube was exchanged for a size #6 laser safe endotracheal tube.  The eyes were taped closed and the bed was turned 90 degrees from anesthesia and the patient was placed  under general anesthesia. A tooth guard was placed over the upper teeth and a Stortz laryngoscope was then inserted into the glottic  position.  The 0 degree telescope was used to evaluate the larynx and upper trachea.  The operating microscope was then  brought into view and a upbiting cup forceps were used to remove a nodule granulation tissue from the left subglottis.  The endotracheal tube was then backed out and the largest tracheal balloon was then inserted and inflated to 7 atmospheres of pressure  for about 60 seconds and then removed.  The endotracheal tube was repositioned and the patient ventilated.  The same was done a second time with the balloon in a more superior position, dilating the larynx for another 60 seconds.  The endotracheal tube  was then repositioned again, this time with a regular endotracheal tube.  The area was suctioned and the 0 degree telescope was used to make a postoperative photographs with the tube out of place.  The laryngoscope was then taken out of suspension and  removed from the patient's mouth while suctioning the airway.  She was turned back to Anesthesia for wakeup.  Granulation tissue was trimmed from the trach site and bleeding was controlled with epinephrine-soaked pledgets and pressure.  When the patient  was ventilating on her own, the endotracheal tube was removed and her 4 cuff cuffless Shiley trach tube was positioned.    She was then awakened up and taken to recovery room in stable condition.    The tracheostomy tube will be decannulated in the recovery room and she will be observed in the hospital for one more night.  AN/NUANCE  D:04/13/2018 T:04/13/2018 JOB:[id number]

## 2018-04-13 NOTE — Progress Notes (Signed)
Received call from [facility] stating patient sinus tachy. Went to assess patient. Patient up in room moving around attempting to go to bathroom. Assisted patient to bathroom, readjusted tele monitor, and placed back to bed.

## 2018-04-13 NOTE — Brief Op Note (Signed)
04/13/2018  9:46 AM  PATIENT:  [name]  77 y.o. female  PRE-OPERATIVE DIAGNOSIS:  laryngeal stenosis  POST-OPERATIVE DIAGNOSIS:  laryngeal stenosis  PROCEDURE:  Procedure(s): MICROLARYNGOSCOPY WITH BALLOON DILATION (N/A)  SURGEON:  Surgeon(s) and Role:    * Melida Quitter, MD - Primary  PHYSICIAN ASSISTANT:   ASSISTANTS: none   ANESTHESIA:   general  EBL: Minimal   BLOOD ADMINISTERED:none  DRAINS: none   LOCAL MEDICATIONS USED:  NONE  SPECIMEN:  No Specimen  DISPOSITION OF SPECIMEN:  N/A  COUNTS:  YES  TOURNIQUET:  * No tourniquets in log *  DICTATION: .Other Dictation: Dictation Number [id number]  PLAN OF CARE: Return to hospital room  PATIENT DISPOSITION:  PACU - hemodynamically stable.   Delay start of Pharmacological VTE agent (>24hrs) due to surgical blood loss or risk of bleeding: yes

## 2018-04-13 NOTE — Progress Notes (Signed)
Pt for dilation of the trach due to posterior glottic stenosis above the trach site to tolerate capping and decannulation, NPO midnight, with consent, CHG bath done, PCR negative, alert and oriented with trach capped, room air, no s/s of distress noted.

## 2018-04-13 NOTE — Progress Notes (Signed)
Report given to OR Barbie Haggis, new peripheral line at right [facility], transfer to OR with telemetry.

## 2018-04-13 NOTE — Addendum Note (Signed)
Addendum  created 04/13/18 1343 by Leonor Liv, CRNA   Child order released for a procedure order, Intraprocedure Blocks edited, Intraprocedure Event edited, [place] created via procedure documentation, Sign clinical note

## 2018-04-13 NOTE — Transfer of Care (Signed)
Immediate Anesthesia Transfer of Care Note  Patient: [name]  Procedure(s) Performed: MICROLARYNGOSCOPY WITH BALLOON DILATION (N/A Throat)  Patient Location: PACU  Anesthesia Type:General  Level of Consciousness: awake, alert  and oriented  Airway & Oxygen Therapy: Patient Spontanous Breathing and facemask to trach  Post-op Assessment: Report given to RN, Post -op Vital signs reviewed and stable and Patient moving all extremities X 4  Post vital signs: Reviewed and stable  Last Vitals:  Vitals Value Taken Time  BP    Temp    Pulse    Resp 24 04/13/2018  9:58 AM  SpO2    Vitals shown include unvalidated device data.  Last Pain:  Vitals:   04/13/18 0800  TempSrc:   PainSc: 0-No pain         Complications: No apparent anesthesia complications

## 2018-04-13 NOTE — Anesthesia Procedure Notes (Signed)
Procedure Name: Intubation Date/Time: 04/13/2018 9:08 AM Performed by: Leonor Liv, CRNA Pre-anesthesia Checklist: Patient identified, Emergency Drugs available, Suction available and Patient being monitored Patient Re-evaluated:Patient Re-evaluated prior to induction Oxygen Delivery Method: Circle System Utilized Preoxygenation: Pre-oxygenation with 100% oxygen (via trach) Induction Type: IV induction Ventilation: Mask ventilation without difficulty Laser Tube: Laser Tube and Cuffed inflated with minimal occlusive pressure - saline Tube size: 5.5 mm Number of attempts: 1 Placement Confirmation: breath sounds checked- equal and bilateral and positive ETCO2 Secured at: 9 cm Tube secured with: Tape Comments: Shiley 4.0 removed and laser tube inserted into stoma without issue.

## 2018-04-13 NOTE — Anesthesia Postprocedure Evaluation (Signed)
Anesthesia Post Note  Patient: [name]  Procedure(s) Performed: MICROLARYNGOSCOPY WITH BALLOON DILATION (N/A Throat)     Patient location during evaluation: PACU Anesthesia Type: General Level of consciousness: awake and alert Pain management: pain level controlled Vital Signs Assessment: post-procedure vital signs reviewed and stable Respiratory status: spontaneous breathing, nonlabored ventilation and respiratory function stable Cardiovascular status: blood pressure returned to baseline and stable Postop Assessment: no apparent nausea or vomiting Anesthetic complications: no    Last Vitals:  Vitals:   04/13/18 1107 04/13/18 1119  BP: (!) 121/58   Pulse: 79 83  Resp: 16 16  Temp: 36.7 C   SpO2: 99% 95%    Last Pain:  Vitals:   04/13/18 1107  TempSrc: Oral  PainSc:                  Lynda Rainwater

## 2018-04-14 ENCOUNTER — Encounter (HOSPITAL_COMMUNITY): Payer: Self-pay | Admitting: Otolaryngology

## 2018-04-14 NOTE — Progress Notes (Signed)
[name] to be D/C'd  per MD order. Discussed with the patient and all questions fully answered.  VSS, Skin clean, dry and intact without evidence of skin break down, no evidence of skin tears noted.  IV catheter discontinued intact. Site without signs and symptoms of complications. Dressing and pressure applied.  An After Visit Summary was printed and given to the patient. Patient received prescription.  D/c education completed with patient/family including follow up instructions, medication list, d/c activities limitations if indicated, with other d/c instructions as indicated by MD - patient able to verbalize understanding, all questions fully answered.   Patient instructed to return to ED, call 911, or call MD for any changes in condition.   Patient to be escorted via [place], and D/C home via private auto.

## 2018-04-14 NOTE — Plan of Care (Signed)
  Problem: Education: Goal: Knowledge of General Education information will improve Description Including pain rating scale, medication(s)/side effects and non-pharmacologic comfort measures Outcome: Progressing Note:  POC reviewed with pt.   

## 2018-04-14 NOTE — Discharge Summary (Signed)
Physician Discharge Summary  Patient ID: [name] MRN: [id number] DOB/AGE: 75-19-1942 75 y.o.  Admit date: 04/11/2018 Discharge date: 04/14/2018  Admission Diagnoses: Laryngeal stenosis, tracheostomy dependence  Discharge Diagnoses:  Active Problems:   Laryngeal stenosis   Discharged Condition: good  Hospital Course: 77 year old female with laryngeal stenosis due to prolonged intubation following cardiac surgery in January.  She has been dilated twice and had been able to cap her trach tube all day every day since the last dilation in July.  She was admitted for round-the-clock capping and monitoring for potential decannulation.  She was observed in a telemetry arrangement and tolerated capping well but had significant inspiratory stridor.  Thus, she was taken to the operating room for dilation.  See operative note.  Afterwards, she did not tolerate capping as well and was observed overnight.  On POD 1, she was able to tolerate capping better but still has some inspiratory stridor.  We discussed options and agreed to leave the trach tube in place for now and resume daytime capping at home.  She is thus stable for discharge.  Consults: None  Significant Diagnostic Studies: None  Treatments: surgery: SMDL with dilation  Discharge Exam: Blood pressure 104/60, pulse 92, temperature 98.1 F (36.7 C), temperature source Oral, resp. rate 16, height 5\' 5"  (1.651 m), weight 67.4 kg, SpO2 99 %. General appearance: alert, cooperative and no distress Neck: #4 cuffless Shiley trach in place, cap applied and some inspiratory stridor but breathing fairly easily at rest  Disposition: Discharge disposition: 01-Home or Self Care       Discharge Instructions    Diet - low sodium heart healthy   Complete by:  As directed    Discharge instructions   Complete by:  As directed    Resume capping the trach tube, as able, during the day only.  Leave trach tube uncapped at night.  Resume normal  activity and diet.   Increase activity slowly   Complete by:  As directed      Allergies as of 04/14/2018      Reactions   Actonel [risedronate] Other (See Comments)   Bone pain and nausea   Lipitor [atorvastatin Calcium] Other (See Comments)   myalgias   Lyrica [pregabalin] Other (See Comments)   SORES IN MOUTH    Sibutramine Hcl Monohydrate Other (See Comments)   UNSPECIFIED REACTION    Alendronate Sodium Nausea Only   Latex Rash, Other (See Comments)   RA to latex 1982; includes bandaids    Levofloxacin Nausea And Vomiting   Meloxicam Other (See Comments)   Vaginal itching       Medication List    TAKE these medications   aspirin 81 MG chewable tablet Chew 81 mg by mouth at bedtime.   calcium carbonate 1250 (500 Ca) MG tablet Commonly known as:  OS-CAL - dosed in mg of elemental calcium Take 1 tablet (500 mg of elemental calcium total) by mouth daily with breakfast.   clonazePAM 0.25 MG disintegrating tablet Commonly known as:  KLONOPIN TAKE 1 TABLET BY MOUTH 3 TIMES A DAY What changed:  when to take this   clorazepate 7.5 MG tablet Commonly known as:  TRANXENE Take 0.5 tab TID PRN   furosemide 20 MG tablet Commonly known as:  LASIX Take 20 mg by mouth daily.   guaiFENesin 100 MG/5ML Soln Commonly known as:  ROBITUSSIN Take 5 mLs (100 mg total) by mouth 3 (three) times daily with meals.   latanoprost 0.005 %  ophthalmic solution Commonly known as:  XALATAN Place 1 drop into both eyes at bedtime.   levothyroxine 112 MCG tablet Commonly known as:  SYNTHROID, LEVOTHROID Take 112 mcg by mouth every other day. Alternates doses   levothyroxine 100 MCG tablet Commonly known as:  SYNTHROID, LEVOTHROID Take 100 mcg by mouth See admin instructions. Take one tablet by mouth every other day (alternates doses)   loratadine 10 MG tablet Commonly known as:  CLARITIN Take 1 tablet (10 mg total) by mouth daily. What changed:  when to take this   magnesium oxide 400  (241.3 Mg) MG tablet Commonly known as:  MAG-OX Take 1 tablet (400 mg total) by mouth daily. What changed:  when to take this   multivitamin with minerals Tabs tablet Take 1 tablet by mouth daily.   potassium chloride 10 MEQ tablet Commonly known as:  K-DUR Take 2 tablets (20 mEq total) by mouth 2 (two) times daily.   PROCTOZONE-HC 2.5 % rectal cream Generic drug:  hydrocortisone APPLY RECTALLY 2 TIMES A DAY AS NEEDED   QUEtiapine 25 MG tablet Commonly known as:  SEROQUEL TAKE 3 TABLETS AT BEDTIME AS DIRECTED   venlafaxine 37.5 MG tablet Commonly known as:  EFFEXOR Take 1 tablet (37.5 mg total) by mouth 2 (two) times daily.      Follow-up Information    Melida Quitter, MD. Schedule an appointment as soon as possible for a visit in 2 week(s).   Specialty:  Otolaryngology Contact information: [address] [place] [address] [phone]           Signed: Melida Quitter 04/14/2018, 7:43 AM

## 2018-04-19 DIAGNOSIS — C73 Malignant neoplasm of thyroid gland: Secondary | ICD-10-CM | POA: Diagnosis not present

## 2018-04-19 DIAGNOSIS — E890 Postprocedural hypothyroidism: Secondary | ICD-10-CM | POA: Diagnosis not present

## 2018-04-26 DIAGNOSIS — J386 Stenosis of larynx: Secondary | ICD-10-CM | POA: Diagnosis not present

## 2018-04-26 DIAGNOSIS — Z930 Tracheostomy status: Secondary | ICD-10-CM | POA: Diagnosis not present

## 2018-05-02 ENCOUNTER — Encounter: Payer: Self-pay | Admitting: Family Medicine

## 2018-05-02 ENCOUNTER — Ambulatory Visit: Payer: Medicare Other | Admitting: Family Medicine

## 2018-05-02 VITALS — BP 121/69 | HR 86 | Temp 97.3°F | Ht 65.0 in | Wt 149.0 lb

## 2018-05-02 DIAGNOSIS — F32A Depression, unspecified: Secondary | ICD-10-CM

## 2018-05-02 DIAGNOSIS — I10 Essential (primary) hypertension: Secondary | ICD-10-CM | POA: Diagnosis not present

## 2018-05-02 DIAGNOSIS — F419 Anxiety disorder, unspecified: Secondary | ICD-10-CM | POA: Diagnosis not present

## 2018-05-02 DIAGNOSIS — F418 Other specified anxiety disorders: Secondary | ICD-10-CM

## 2018-05-02 DIAGNOSIS — E538 Deficiency of other specified B group vitamins: Secondary | ICD-10-CM

## 2018-05-02 DIAGNOSIS — F329 Major depressive disorder, single episode, unspecified: Secondary | ICD-10-CM

## 2018-05-02 DIAGNOSIS — C73 Malignant neoplasm of thyroid gland: Secondary | ICD-10-CM

## 2018-05-02 NOTE — Progress Notes (Signed)
Subjective:    Patient ID: [name], female    DOB: [birth year], 77 y.o.   MRN: [id number]  HPI Patient here today for 2 month follow up on medications.  Patient still working on capping her trach and following closely with ear nose and throat specialist for doing this.  She has a lot of anxiety.  There is a lot of expense associated with her medications.  She wants to discuss this and what she can do with this.  She is currently taking atenolol Celebrex levothyroxine simvastatin tramadol Effexor getting B12 injections and taking vitamin D3 and taking furosemide and 81 mg aspirin.  The patient has also had thyroid cancer.  She has had a thyroidectomy.  She has depression and anxiety.  She has had a left total knee replacement.  The major surgery that she has had that she is still slowly recovering from his aortic valve replacement and replacement of the a sending aorta.  Her vital signs are stable and her weight is stable.  The patient comes with her husband to the visit today.  All her meds were reviewed and she is currently taking the Tranxene 7.5 one half in the morning and 1 whole one at bedtime.  She is currently only doing Klonopin 0.25 twice daily.  When she runs out of the Tranxene she will switch the Klonopin and take 1 in the morning and 2 at bedtime in place of the transaminases she was taking at bedtime.  Both the patient and her husband understand this change when she runs out of the transiting.  The patient is doing well.  She is still working on getting the trach removed and will be seeing a specialist in [place] to make that decision further.  They do not have an appointment yet.  She is still capping the trach during the day but opens it at nighttime because she feels like she is smothering at nighttime.    Patient Active Problem List   Diagnosis Date Noted  . Laryngeal stenosis 04/11/2018  . Senile purpura ([place]) 02/02/2018  . Leg swelling 01/17/2018  . Hypokalemia  12/27/2017  . Hypomagnesemia 12/27/2017  . Hypocalcemia 12/27/2017  . Sensory disturbance 12/27/2017  . Tracheostomy status ([place])   . Airway obstruction   . Dyspnea on exertion   . Stridor   . Acute diastolic congestive heart failure ([place])   . Dysphagia   . Anxiety and depression   . Weakness 08/12/2017  . PAF (paroxysmal atrial fibrillation) ([place])   . Pressure injury of skin 08/04/2017  . Tracheostomy dependence ([place])   . Acute encephalopathy   . HCAP (healthcare-associated pneumonia)   . Acute respiratory failure ([place])   . RVF (right ventricular failure) ([place])   . RVAD (right ventricular assist device) present ([place])   . Cardiogenic shock ([place])   . S/P AVR 07/10/2017  . Nephrolithiasis 04/07/2017  . Vitamin B 12 deficiency 03/01/2016  . Ascending aortic aneurysm ([place]) 08/20/2015  . Thyroid cancer ([place]) 07/30/2015  . Left thyroid nodule 07/27/2015  . Osteoporosis with fracture 04/16/2014  . At high risk for falls 04/16/2014  . Proximal humerus fracture 05/30/2011  . CHEST PAIN, PRECORDIAL 02/26/2010  . Hyperlipidemia 02/23/2010  . Depression 02/23/2010  . GLAUCOMA 02/23/2010  . CATARACTS 02/23/2010  . RHEUMATIC FEVER 02/23/2010  . Essential hypertension 02/23/2010  . MITRAL VALVE PROLAPSE 02/23/2010  . GERD 02/23/2010  . Osteoarthritis 02/23/2010  . Primary fibromyalgia syndrome 02/23/2010   Outpatient Encounter Medications as of  05/02/2018  Medication Sig  . aspirin 81 MG chewable tablet Chew 81 mg by mouth at bedtime.   . calcium carbonate (OS-CAL - DOSED IN MG OF ELEMENTAL CALCIUM) 1250 (500 Ca) MG tablet Take 1 tablet (500 mg of elemental calcium total) by mouth daily with breakfast.  . clonazePAM (KLONOPIN) 0.25 MG disintegrating tablet TAKE 1 TABLET BY MOUTH 3 TIMES A DAY (Patient taking differently: Take 0.25 mg by mouth 2 (two) times daily. )  . clorazepate (TRANXENE) 7.5 MG tablet Take 0.5 tab TID PRN  . furosemide (LASIX) 20 MG tablet Take 20 mg by mouth  daily.   Marland Kitchen guaiFENesin (ROBITUSSIN) 100 MG/5ML SOLN Take 5 mLs (100 mg total) by mouth 3 (three) times daily with meals.  . latanoprost (XALATAN) 0.005 % ophthalmic solution Place 1 drop into both eyes at bedtime.  Marland Kitchen levothyroxine (SYNTHROID, LEVOTHROID) 100 MCG tablet Take 100 mcg by mouth See admin instructions. Take one tablet by mouth every other day (alternates doses)  . levothyroxine (SYNTHROID, LEVOTHROID) 112 MCG tablet Take 112 mcg by mouth every other day. Alternates doses  . loratadine (CLARITIN) 10 MG tablet Take 1 tablet (10 mg total) by mouth daily. (Patient taking differently: Take 10 mg by mouth every evening. )  . magnesium oxide (MAG-OX) 400 (241.3 Mg) MG tablet Take 1 tablet (400 mg total) by mouth daily. (Patient taking differently: Take 400 mg by mouth 2 (two) times daily. )  . Multiple Vitamin (MULTIVITAMIN WITH MINERALS) TABS tablet Take 1 tablet by mouth daily.  . potassium chloride (K-DUR) 10 MEQ tablet Take 2 tablets (20 mEq total) by mouth 2 (two) times daily.  Marland Kitchen PROCTOZONE-HC 2.5 % rectal cream APPLY RECTALLY 2 TIMES A DAY AS NEEDED  . QUEtiapine (SEROQUEL) 25 MG tablet TAKE 3 TABLETS AT BEDTIME AS DIRECTED  . venlafaxine (EFFEXOR) 37.5 MG tablet Take 1 tablet (37.5 mg total) by mouth 2 (two) times daily.   Facility-Administered Encounter Medications as of 05/02/2018  Medication  . cyanocobalamin ((VITAMIN B-12)) injection 1,000 mcg      Review of Systems  Constitutional: Negative.   HENT: Negative.   Eyes: Negative.   Respiratory: Negative.   Cardiovascular: Negative.   Gastrointestinal: Negative.   Endocrine: Negative.   Genitourinary: Negative.   Musculoskeletal: Negative.   Skin: Negative.   Allergic/Immunologic: Negative.   Neurological: Negative.   Hematological: Negative.   Psychiatric/Behavioral: Negative.        Objective:   Physical Exam  Constitutional: She is oriented to person, place, and time. She appears well-developed and  well-nourished.  Patient looks good and is feeling well and getting her strength back.  She is more active than ever and getting out and doing housework currently.  HENT:  Head: Normocephalic and atraumatic.  Right Ear: External ear normal.  Left Ear: External ear normal.  Nose: Nose normal.  Mouth/Throat: Oropharynx is clear and moist.  Eyes: Pupils are equal, round, and reactive to light. Conjunctivae and EOM are normal. Right eye exhibits no discharge. Left eye exhibits no discharge. No scleral icterus.  Neck: Normal range of motion. Neck supple. No thyromegaly present.  Tracheostomy in place  Cardiovascular: Normal rate, regular rhythm and intact distal pulses.  Murmur heard. The heart is regular at 72/min with a grade 2/6 systolic ejection murmur  Pulmonary/Chest: Effort normal and breath sounds normal. She has no wheezes. She has no rales.  Musculoskeletal: Normal range of motion. She exhibits no edema.  Lymphadenopathy:    She has no cervical  adenopathy.  Neurological: She is alert and oriented to person, place, and time. She has normal reflexes.  Skin: Skin is warm and dry. No rash noted.  Psychiatric: She has a normal mood and affect. Her behavior is normal. Judgment and thought content normal.  Mood affect and behavior are more normal for this patient than ever and she continues to improve and her recovery from this major surgery back earlier in the year.  Nursing note and vitals reviewed.   BP 121/69 (BP Location: Left Arm)   Pulse 86   Temp (!) 97.3 F (36.3 C) (Oral)   Ht 5\' 5"  (1.651 m)   Wt 149 lb (67.6 kg)   BMI 24.79 kg/m        Assessment & Plan:  1. Anxiety about health -This is improving.  Patient will continue with Effexor and when she runs out of transanal she will take an extra clonazepam at bedtime in place of the Tranxene.  2. Anxiety and depression -Continue with Effexor and clonazepam and finish up with Tranxene as discussed  3. Essential  hypertension -The blood pressure is good and she will continue with current treatment.  Her last potassium was good.  4. B12 deficiency -Continue with B12 shots monthly and at the next blood draw we will check another BMP we will make sure that we get a B12 level.  5. Thyroid cancer ([place]) -Follow-up with thyroid specialist as planned  Patient Instructions  Continue with current medication regimen When the patient runs out of Tranxene, she will take an extra Klonopin 0.25 at bedtime and that way it will be 0.5 at bedtime and two-point 5 in the morning.  Currently she is only taking Klonopin twice daily. The patient's husband helps her keep up with all of her medication and both she and her husband understand when and how to make this change in medication She will follow-up as planned with ear nose and throat specialist at [facility] and they will remind that specialist to send [place] a note about what his plan is far as her tracheostomy is concerned She will get a B12 shot today.  Arrie Senate MD

## 2018-05-02 NOTE — Patient Instructions (Signed)
Continue with current medication regimen When the patient runs out of Tranxene, she will take an extra Klonopin 0.25 at bedtime and that way it will be 0.5 at bedtime and two-point 5 in the morning.  Currently she is only taking Klonopin twice daily. The patient's husband helps her keep up with all of her medication and both she and her husband understand when and how to make this change in medication She will follow-up as planned with ear nose and throat specialist at [facility] and they will remind that specialist to send [place] a note about what his plan is far as her tracheostomy is concerned She will get a B12 shot today.

## 2018-05-04 DIAGNOSIS — R5381 Other malaise: Secondary | ICD-10-CM | POA: Diagnosis not present

## 2018-05-04 DIAGNOSIS — Z930 Tracheostomy status: Secondary | ICD-10-CM | POA: Diagnosis not present

## 2018-05-05 DIAGNOSIS — Z930 Tracheostomy status: Secondary | ICD-10-CM | POA: Diagnosis not present

## 2018-05-05 DIAGNOSIS — R5381 Other malaise: Secondary | ICD-10-CM | POA: Diagnosis not present

## 2018-05-09 DIAGNOSIS — Z930 Tracheostomy status: Secondary | ICD-10-CM | POA: Diagnosis not present

## 2018-05-09 DIAGNOSIS — Z888 Allergy status to other drugs, medicaments and biological substances status: Secondary | ICD-10-CM | POA: Diagnosis not present

## 2018-05-09 DIAGNOSIS — R061 Stridor: Secondary | ICD-10-CM | POA: Diagnosis not present

## 2018-05-09 DIAGNOSIS — Z931 Gastrostomy status: Secondary | ICD-10-CM | POA: Diagnosis not present

## 2018-05-09 DIAGNOSIS — J386 Stenosis of larynx: Secondary | ICD-10-CM | POA: Diagnosis not present

## 2018-05-09 DIAGNOSIS — R490 Dysphonia: Secondary | ICD-10-CM | POA: Diagnosis not present

## 2018-05-09 DIAGNOSIS — Z91040 Latex allergy status: Secondary | ICD-10-CM | POA: Diagnosis not present

## 2018-05-09 DIAGNOSIS — Z881 Allergy status to other antibiotic agents status: Secondary | ICD-10-CM | POA: Diagnosis not present

## 2018-05-23 ENCOUNTER — Ambulatory Visit
Admission: RE | Admit: 2018-05-23 | Discharge: 2018-05-23 | Disposition: A | Discharge status: Home / Self Care | Payer: Medicare Other | Source: Ambulatory Visit | Attending: Cardiothoracic Surgery | Admitting: Cardiothoracic Surgery

## 2018-05-23 ENCOUNTER — Emergency Department (HOSPITAL_COMMUNITY)
Admission: EM | Admit: 2018-05-23 | Discharge: 2018-05-23 | Disposition: A | Discharge status: Home / Self Care | Payer: Medicare Other | Attending: Emergency Medicine | Admitting: Emergency Medicine

## 2018-05-23 ENCOUNTER — Emergency Department (HOSPITAL_COMMUNITY): Payer: Medicare Other

## 2018-05-23 ENCOUNTER — Ambulatory Visit (INDEPENDENT_AMBULATORY_CARE_PROVIDER_SITE_OTHER): Payer: Medicare Other | Admitting: Cardiothoracic Surgery

## 2018-05-23 ENCOUNTER — Encounter: Payer: Self-pay | Admitting: Cardiothoracic Surgery

## 2018-05-23 VITALS — BP 140/80 | HR 82 | Resp 20 | Ht 65.0 in | Wt 155.0 lb

## 2018-05-23 DIAGNOSIS — S51812A Laceration without foreign body of left forearm, initial encounter: Secondary | ICD-10-CM | POA: Diagnosis not present

## 2018-05-23 DIAGNOSIS — S0993XA Unspecified injury of face, initial encounter: Secondary | ICD-10-CM | POA: Diagnosis not present

## 2018-05-23 DIAGNOSIS — W19XXXA Unspecified fall, initial encounter: Secondary | ICD-10-CM | POA: Diagnosis not present

## 2018-05-23 DIAGNOSIS — Z952 Presence of prosthetic heart valve: Secondary | ICD-10-CM

## 2018-05-23 DIAGNOSIS — Z8679 Personal history of other diseases of the circulatory system: Secondary | ICD-10-CM | POA: Diagnosis not present

## 2018-05-23 DIAGNOSIS — Z91040 Latex allergy status: Secondary | ICD-10-CM | POA: Diagnosis not present

## 2018-05-23 DIAGNOSIS — F329 Major depressive disorder, single episode, unspecified: Secondary | ICD-10-CM | POA: Insufficient documentation

## 2018-05-23 DIAGNOSIS — Z9049 Acquired absence of other specified parts of digestive tract: Secondary | ICD-10-CM | POA: Diagnosis not present

## 2018-05-23 DIAGNOSIS — W01198A Fall on same level from slipping, tripping and stumbling with subsequent striking against other object, initial encounter: Secondary | ICD-10-CM | POA: Diagnosis not present

## 2018-05-23 DIAGNOSIS — Y998 Other external cause status: Secondary | ICD-10-CM | POA: Insufficient documentation

## 2018-05-23 DIAGNOSIS — Y9389 Activity, other specified: Secondary | ICD-10-CM | POA: Insufficient documentation

## 2018-05-23 DIAGNOSIS — Z7982 Long term (current) use of aspirin: Secondary | ICD-10-CM | POA: Insufficient documentation

## 2018-05-23 DIAGNOSIS — Z96651 Presence of right artificial knee joint: Secondary | ICD-10-CM | POA: Diagnosis not present

## 2018-05-23 DIAGNOSIS — Z79899 Other long term (current) drug therapy: Secondary | ICD-10-CM | POA: Insufficient documentation

## 2018-05-23 DIAGNOSIS — F419 Anxiety disorder, unspecified: Secondary | ICD-10-CM | POA: Insufficient documentation

## 2018-05-23 DIAGNOSIS — Z85850 Personal history of malignant neoplasm of thyroid: Secondary | ICD-10-CM | POA: Insufficient documentation

## 2018-05-23 DIAGNOSIS — I719 Aortic aneurysm of unspecified site, without rupture: Secondary | ICD-10-CM | POA: Diagnosis not present

## 2018-05-23 DIAGNOSIS — I110 Hypertensive heart disease with heart failure: Secondary | ICD-10-CM | POA: Insufficient documentation

## 2018-05-23 DIAGNOSIS — R58 Hemorrhage, not elsewhere classified: Secondary | ICD-10-CM | POA: Diagnosis not present

## 2018-05-23 DIAGNOSIS — J45909 Unspecified asthma, uncomplicated: Secondary | ICD-10-CM | POA: Diagnosis not present

## 2018-05-23 DIAGNOSIS — Z9889 Other specified postprocedural states: Secondary | ICD-10-CM

## 2018-05-23 DIAGNOSIS — E039 Hypothyroidism, unspecified: Secondary | ICD-10-CM | POA: Diagnosis not present

## 2018-05-23 DIAGNOSIS — S0990XA Unspecified injury of head, initial encounter: Secondary | ICD-10-CM | POA: Insufficient documentation

## 2018-05-23 DIAGNOSIS — R51 Headache: Secondary | ICD-10-CM | POA: Diagnosis not present

## 2018-05-23 DIAGNOSIS — S199XXA Unspecified injury of neck, initial encounter: Secondary | ICD-10-CM | POA: Diagnosis not present

## 2018-05-23 DIAGNOSIS — M25562 Pain in left knee: Secondary | ICD-10-CM | POA: Diagnosis not present

## 2018-05-23 DIAGNOSIS — Y92481 Parking lot as the place of occurrence of the external cause: Secondary | ICD-10-CM | POA: Diagnosis not present

## 2018-05-23 DIAGNOSIS — R0689 Other abnormalities of breathing: Secondary | ICD-10-CM | POA: Diagnosis not present

## 2018-05-23 DIAGNOSIS — I5030 Unspecified diastolic (congestive) heart failure: Secondary | ICD-10-CM | POA: Diagnosis not present

## 2018-05-23 DIAGNOSIS — R457 State of emotional shock and stress, unspecified: Secondary | ICD-10-CM | POA: Diagnosis not present

## 2018-05-23 MED ORDER — IOPAMIDOL (ISOVUE-370) INJECTION 76%
75.0000 mL | Freq: Once | INTRAVENOUS | Status: AC | PRN
  Administered 2018-05-23: 75 mL via INTRAVENOUS

## 2018-05-23 NOTE — Discharge Instructions (Signed)
It was my pleasure taking care of you today!   Tylenol as needed for pain.   Keep your wounds clean and dry.   Follow up with your doctor for further discussion of today's hospital visit.   Return to ER for new or worsening symptoms, any additional concerns.

## 2018-05-23 NOTE — Progress Notes (Signed)
PCP is Chipper Herb, MD Referring Provider is Chipper Herb, MD  Chief Complaint  Patient presents with  . Routine Post Op    2 month f/u with Chest CTA  HX of thoracic aortic repair    HPI: Patient returns for scheduled follow-up little over 1 year after resection and grafting of ascending aortic aneurysm and aortic valve replacement with bioprosthetic valve.  Patient has COPD and required prolonged intubation and tracheostomy.  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  She is able to do her household activities without much difficulty.  She still has some problems with airway secretions especially at night in the supine position.  Cardiac function is stable.  She is in sinus rhythm.  No symptoms of angina or CHF.  Peripheral edema has resolved.  CT scan shows no pleural effusion.  CTA of her thoracic aorta performed today shows the aortic aneurysm repair to be intact and with normal aortic dimensions.--no false aneurysm or residual aneurysmal dilatation. The patient will need a echocardiogram to assess biventricular function and aortic valve function and this will be arranged for in about 6 months.  She remains to have difficulties with anxiety and is on medication managed by her primary care physician-these include Klonopin, Seroquel, Effexor, and Tranxene. Past Medical History:  Diagnosis Date  . ACL tear    right  Dr. Gladstone Pih   . Adenomatous polyp   . Anxiety   . Arthritis    "all over" (04/12/2018)  . Ascending aortic aneurysm ([place])    note per chart per Dr Lucianne Lei Tright 4.7 cm 04/15/2015   . Asthma   . B12 deficiency   . Chronic bronchitis ([place])   . Depression   . DUB (dysfunctional uterine bleeding) 10/96  . Fibromyalgia   . GERD (gastroesophageal reflux disease)   . Glaucoma    bilaterally  . Helicobacter pylori (H. pylori)   . Hiatal hernia   . History of blood transfusion 06/2017   "related to  [facility]"  . History of kidney stones   . History of left shoulder fracture    pt states fell off bed and broke ball in shoulder had rod placed   . Hyperlipidemia   . Hypertension   . Hypothyroidism   . Inspiratory stridor   . Laryngeal stenosis   . Occasional tremors    left arm   . Osteoarthritis   . Osteopenia   . Osteoporosis   . Thyroid cancer ([place]) 2017  . Thyroid nodule   . Tracheostomy in place [facility])     Past Surgical History:  Procedure Laterality Date  . AORTIC VALVE REPLACEMENT N/A 07/10/2017   Procedure: AORTIC VALVE REPLACEMENT (AVR);  Surgeon: [name], Collier Salina, MD;  Location: [place];  Service: Open Heart Surgery;  Laterality: N/A;  . APPENDECTOMY    . BUNIONECTOMY  08/1999   right - Dr. Irving Shows   . CARDIAC VALVE REPLACEMENT    . CATARACT EXTRACTION, BILATERAL Bilateral 05/28/07-2009   "right-left"  . CHOLECYSTECTOMY OPEN  1979  . DILATION AND CURETTAGE OF UTERUS  03/31/95   Dr. Ovid Curd   . DIRECT LARYNGOSCOPY WITH BOTOX INJECTION N/A 12/01/2017   Procedure: SUSPENDED DIRECT MICROLARYNGOSCOPY WITH KENALOG INJECTION, CO2 LASER;  Surgeon: Melida Quitter, MD;  Location: [place];  Service: ENT;  Laterality: N/A;  . EXCISIONAL HEMORRHOIDECTOMY    . EXPLORATION POST OPERATIVE OPEN HEART N/A 07/11/2017   Procedure: EXPLORATION POST OPERATIVE OPEN  HEART;  Surgeon: Ivin Poot, MD;  Location: [place];  Service: Open Heart Surgery;  Laterality: N/A;  . EYE SURGERY     also had left cataract removed   . FRACTURE SURGERY    . HEMATOMA EVACUATION N/A 07/11/2017   Procedure: EVACUATION HEMATOMA;  Surgeon: Ivin Poot, MD;  Location: [place];  Service: Open Heart Surgery;  Laterality: N/A;  . HERNIA REPAIR    . JOINT REPLACEMENT    . MICROLARYNGOSCOPY WITH LASER AND BALLOON DILATION N/A 01/24/2018   Procedure: MICROLARYNGOSCOPY WITH LASER AND BALLOON DILATION;  Surgeon: Melida Quitter, MD;  Location: [place];  Service: ENT;  Laterality: N/A;  . MICROLARYNGOSCOPY WITH LASER AND BALLOON  DILATION N/A 04/13/2018   Procedure: MICROLARYNGOSCOPY WITH BALLOON DILATION;  Surgeon: Melida Quitter, MD;  [facility];  Service: ENT;  Laterality: N/A;  . NEPHROLITHOTOMY Left 04/07/2017   Procedure: NEPHROLITHOTOMY PERCUTANEOUS WITH SURGEON ACCESS;  Surgeon: Ardis Hughs, MD;  Location: [facility];  Service: Urology;  Laterality: Left;  . ORIF HUMERUS FRACTURE  05/30/2011   Procedure: OPEN REDUCTION INTERNAL FIXATION (ORIF) PROXIMAL HUMERUS FRACTURE;  Surgeon: Augustin Schooling;  Location: [place];  Service: Orthopedics;  Laterality: Left;  open reduction internal fixation of proximal humerus fracture  . PLACEMENT OF CENTRIMAG VENTRICULAR ASSIST DEVICE Right 07/11/2017   Procedure: PLACEMENT OF CENTRIMAG VENTRICULAR ASSIST DEVICE;  Surgeon: Ivin Poot, MD;  Location: [place];  Service: Open Heart Surgery;  Laterality: Right;  . REMOVAL OF CENTRIMAG VENTRICULAR ASSIST DEVICE N/A 07/17/2017   Procedure: REMOVAL OF RVAD WITH PUMP STANDBY;  Surgeon: Ivin Poot, MD;  Location: [place];  Service: Open Heart Surgery;  Laterality: N/A;  . REPLACEMENT ASCENDING AORTA N/A 07/10/2017   Procedure: REPLACEMENT ASCENDING AORTA;  Surgeon: Ivin Poot, MD;  Location: [place];  Service: Open Heart Surgery;  Laterality: N/A;  . RIGHT/LEFT HEART CATH AND CORONARY ANGIOGRAPHY N/A 06/13/2017   Procedure: RIGHT/LEFT HEART CATH AND CORONARY ANGIOGRAPHY;  Surgeon: Jolaine Artist, MD;  Location: [place] [facility];  Service: Cardiovascular;  Laterality: N/A;  . TEE WITHOUT CARDIOVERSION N/A 07/10/2017   Procedure: TRANSESOPHAGEAL ECHOCARDIOGRAM (TEE);  Surgeon: [name], Collier Salina, MD;  Location: [place];  Service: Open Heart Surgery;  Laterality: N/A;  . TEE WITHOUT CARDIOVERSION  07/11/2017   Procedure: TRANSESOPHAGEAL ECHOCARDIOGRAM (TEE);  Surgeon: [name], Collier Salina, MD;  Location: [place];  Service: Open Heart Surgery;;  . TEE WITHOUT CARDIOVERSION N/A 07/17/2017   Procedure: TRANSESOPHAGEAL ECHOCARDIOGRAM  (TEE);  Surgeon: [name], Collier Salina, MD;  Location: [place];  Service: Open Heart Surgery;  Laterality: N/A;  . THYROID LOBECTOMY Left 07/27/2015   Procedure: LEFT THYROID LOBECTOMY;  Surgeon: Fanny Skates, MD;  [facility];  Service: General;  Laterality: Left;  . THYROIDECTOMY N/A 08/06/2015   Procedure: RIGHT THYROID LOBECTOMY, REIMPLANTATION PARATHYROID;  Surgeon: Fanny Skates, MD;  Location[facility];  Service: General;  Laterality: N/A;  . TOTAL KNEE ARTHROPLASTY Right 08/2009   Dr. Gladstone Pih  . TRACHEOSTOMY  08/08/2017  . VENTRAL HERNIA REPAIR  2/99    Family History  Problem Relation Age of Onset  . Cancer Mother        PANCREATIC   . Osteoporosis Mother   . Hip fracture Mother   . Heart attack Father 63       Died 68  . Heart disease Father   . Arthritis Father   . Hypertension Sister   . Cancer Sister  skin  . Hypertension Sister   . Cancer Sister        breast cancer  . Breast cancer Neg Hx     Social History Social History   Tobacco Use  . Smoking status: Never Smoker  . Smokeless tobacco: Never Used  Substance Use Topics  . Alcohol use: No  . Drug use: Never    Current Outpatient Medications  Medication Sig Dispense Refill  . aspirin 81 MG chewable tablet Chew 81 mg by mouth at bedtime.     . calcium carbonate (OS-CAL - DOSED IN MG OF ELEMENTAL CALCIUM) 1250 (500 Ca) MG tablet Take 1 tablet (500 mg of elemental calcium total) by mouth daily with breakfast. 30 tablet 0  . clonazePAM (KLONOPIN) 0.25 MG disintegrating tablet TAKE 1 TABLET BY MOUTH 3 TIMES A DAY (Patient taking differently: Take 0.25 mg by mouth 2 (two) times daily. ) 60 tablet 2  . clorazepate (TRANXENE) 7.5 MG tablet Take 0.5 tab TID PRN 45 tablet 3  . furosemide (LASIX) 20 MG tablet Take 20 mg by mouth daily.     Marland Kitchen guaiFENesin (ROBITUSSIN) 100 MG/5ML SOLN Take 5 mLs (100 mg total) by mouth 3 (three) times daily with meals. 1200 mL 0  . latanoprost (XALATAN) 0.005 % ophthalmic solution  Place 1 drop into both eyes at bedtime. 2.5 mL 12  . levothyroxine (SYNTHROID, LEVOTHROID) 100 MCG tablet Take 100 mcg by mouth See admin instructions. Take one tablet by mouth every other day (alternates doses)    . levothyroxine (SYNTHROID, LEVOTHROID) 112 MCG tablet Take 112 mcg by mouth every other day. Alternates doses    . loratadine (CLARITIN) 10 MG tablet Take 1 tablet (10 mg total) by mouth daily. (Patient taking differently: Take 10 mg by mouth every evening. ) 30 tablet 11  . magnesium oxide (MAG-OX) 400 (241.3 Mg) MG tablet Take 1 tablet (400 mg total) by mouth daily. (Patient taking differently: Take 400 mg by mouth 2 (two) times daily. ) 30 tablet 0  . Multiple Vitamin (MULTIVITAMIN WITH MINERALS) TABS tablet Take 1 tablet by mouth daily.    . potassium chloride (K-DUR) 10 MEQ tablet Take 2 tablets (20 mEq total) by mouth 2 (two) times daily. 120 tablet 11  . PROCTOZONE-HC 2.5 % rectal cream APPLY RECTALLY 2 TIMES A DAY AS NEEDED 30 g 2  . QUEtiapine (SEROQUEL) 25 MG tablet TAKE 3 TABLETS AT BEDTIME AS DIRECTED 90 tablet 2  . venlafaxine (EFFEXOR) 37.5 MG tablet Take 1 tablet (37.5 mg total) by mouth 2 (two) times daily. 60 tablet 11   Current Facility-Administered Medications  Medication Dose Route Frequency Provider Last Rate Last Dose  . cyanocobalamin ((VITAMIN B-12)) injection 1,000 mcg  1,000 mcg Intramuscular Q30 days Chipper Herb, MD   1,000 mcg at 05/02/18 1111    Allergies  Allergen Reactions  . Actonel [Risedronate] Other (See Comments)    Bone pain and nausea   . Lipitor [Atorvastatin Calcium] Other (See Comments)    myalgias  . Lyrica [Pregabalin] Other (See Comments)    SORES IN MOUTH   . Sibutramine Hcl Monohydrate Other (See Comments)    UNSPECIFIED REACTION   . Alendronate Sodium Nausea Only  . Latex Rash and Other (See Comments)    RA to latex 1982; includes bandaids    . Levofloxacin Nausea And Vomiting  . Meloxicam Other (See Comments)    Vaginal  itching     Review of Systems   Patient leaving trach  open at all times per instructions by her ENT physician at [facility].  Pending yearly surgery next month. Patient uses a cane for walking. BP 140/80   Pulse 82   Resp 20   Ht 5\' 5"  (1.651 m)   Wt 155 lb (70.3 kg)   SpO2 98% Comment: RA  BMI 25.79 kg/m  Physical Exam Alert and comfortable Breathing without difficulty at rest.  Some scattered rhonchi on auscultation. Heart rate regular without gallop.  Soft systolic flow murmur through bioprosthetic valve without diastolic murmur of AI Sternal incision well-healed. No peripheral edema   Diagnostic Tests: CT images performed today personally reviewed and counseled with patient. Large ascending aneurysm now resected and grafted with intact repair  Impression: Patient is stable.  Blood pressure and heart rate are stable.  Her airway remains the major problem at this point and she is being evaluated for yearly surgery at [place]: Return in 5-6 months after echocardiogram to assess valvular function and biventricular function as patient had postop transient RV dysfunction.   Len Childs, MD [facility] [phone]

## 2018-05-23 NOTE — ED Provider Notes (Signed)
[facility] EMERGENCY DEPARTMENT Provider Note   CSN: [id number] Arrival date & time: 05/23/18  1836     History   Chief Complaint Chief Complaint  Patient presents with  . Fall    mechanical    HPI YEHUDIT FULGINITI is a 77 y.o. female.  The history is provided by the patient and medical records. No language interpreter was used.  Fall    MARRION FINAN is a 77 y.o. female with past medical history as listed below who presents to the emergency department today for evaluation after a mechanical fall just prior to arrival.  Patient states that she typically walks with a cane at baseline.  She was using her cane today walking across the parking lot when she felt as if her cane was very unsteady and she fell forward.  She landed on her left arm and face.  She reports bleeding and skin tears to the left forearm, but denies any pain to the extremity.  She did not lose consciousness.  No nausea or vomiting.  Denies any weakness, numbness, dizziness or lightheadedness.  She denies any anticoagulation.  Reports pain to the nose and left knee.  Past Medical History:  Diagnosis Date  . ACL tear    right  Dr. Gladstone Pih   . Adenomatous polyp   . Anxiety   . Arthritis    "all over" (04/12/2018)  . Ascending aortic aneurysm ([place])    note per chart per Dr Lucianne Lei Tright 4.7 cm 04/15/2015   . Asthma   . B12 deficiency   . Chronic bronchitis ([place])   . Depression   . DUB (dysfunctional uterine bleeding) 10/96  . Fibromyalgia   . GERD (gastroesophageal reflux disease)   . Glaucoma    bilaterally  . Helicobacter pylori (H. pylori)   . Hiatal hernia   . History of blood transfusion 06/2017   "related to OHS"  . History of kidney stones   . History of left shoulder fracture    pt states fell off bed and broke ball in shoulder had rod placed   . Hyperlipidemia   . Hypertension   . Hypothyroidism   . Inspiratory stridor   . Laryngeal stenosis   . Occasional tremors    left arm   . Osteoarthritis   . Osteopenia   . Osteoporosis   . Thyroid cancer ([place]) 2017  . Thyroid nodule   . Tracheostomy in place [facility])     Patient Active Problem List   Diagnosis Date Noted  . Laryngeal stenosis 04/11/2018  . Senile purpura ([place]) 02/02/2018  . Leg swelling 01/17/2018  . Hypokalemia 12/27/2017  . Hypomagnesemia 12/27/2017  . Hypocalcemia 12/27/2017  . Sensory disturbance 12/27/2017  . Tracheostomy status ([place])   . Airway obstruction   . Dyspnea on exertion   . Stridor   . Acute diastolic congestive heart failure ([place])   . Dysphagia   . Anxiety and depression   . Weakness 08/12/2017  . PAF (paroxysmal atrial fibrillation) ([place])   . Pressure injury of skin 08/04/2017  . Tracheostomy dependence ([place])   . Acute encephalopathy   . HCAP (healthcare-associated pneumonia)   . Acute respiratory failure ([place])   . RVF (right ventricular failure) ([place])   . RVAD (right ventricular assist device) present ([place])   . Cardiogenic shock ([place])   . S/P AVR 07/10/2017  . Nephrolithiasis 04/07/2017  . Vitamin B 12 deficiency 03/01/2016  . Ascending aortic aneurysm ([place]) 08/20/2015  . Thyroid  cancer ([place]) 07/30/2015  . Left thyroid nodule 07/27/2015  . Osteoporosis with fracture 04/16/2014  . At high risk for falls 04/16/2014  . Proximal humerus fracture 05/30/2011  . CHEST PAIN, PRECORDIAL 02/26/2010  . Hyperlipidemia 02/23/2010  . Depression 02/23/2010  . GLAUCOMA 02/23/2010  . CATARACTS 02/23/2010  . RHEUMATIC FEVER 02/23/2010  . Essential hypertension 02/23/2010  . MITRAL VALVE PROLAPSE 02/23/2010  . GERD 02/23/2010  . Osteoarthritis 02/23/2010  . Primary fibromyalgia syndrome 02/23/2010    Past Surgical History:  Procedure Laterality Date  . AORTIC VALVE REPLACEMENT N/A 07/10/2017   Procedure: AORTIC VALVE REPLACEMENT (AVR);  Surgeon: [name], Collier Salina, MD;  Location: [facility];  Service: Open Heart Surgery;  Laterality: N/A;  . APPENDECTOMY    . BUNIONECTOMY   08/1999   right - Dr. Irving Shows   . CARDIAC VALVE REPLACEMENT    . CATARACT EXTRACTION, BILATERAL Bilateral 05/28/07-2009   "right-left"  . CHOLECYSTECTOMY OPEN  1979  . DILATION AND CURETTAGE OF UTERUS  03/31/95   Dr. Ovid Curd   . DIRECT LARYNGOSCOPY WITH BOTOX INJECTION N/A 12/01/2017   Procedure: SUSPENDED DIRECT MICROLARYNGOSCOPY WITH KENALOG INJECTION, CO2 LASER;  Surgeon: Melida Quitter, MD;  Location: [facility];  Service: ENT;  Laterality: N/A;  . EXCISIONAL HEMORRHOIDECTOMY    . EXPLORATION POST OPERATIVE OPEN HEART N/A 07/11/2017   Procedure: EXPLORATION POST OPERATIVE OPEN HEART;  Surgeon: Ivin Poot, MD;  Location: [facility];  Service: Open Heart Surgery;  Laterality: N/A;  . EYE SURGERY     also had left cataract removed   . FRACTURE SURGERY    . HEMATOMA EVACUATION N/A 07/11/2017   Procedure: EVACUATION HEMATOMA;  Surgeon: Ivin Poot, MD;  Location: [facility];  Service: Open Heart Surgery;  Laterality: N/A;  . HERNIA REPAIR    . JOINT REPLACEMENT    . MICROLARYNGOSCOPY WITH LASER AND BALLOON DILATION N/A 01/24/2018   Procedure: MICROLARYNGOSCOPY WITH LASER AND BALLOON DILATION;  Surgeon: Melida Quitter, MD;  Location: [facility];  Service: ENT;  Laterality: N/A;  . MICROLARYNGOSCOPY WITH LASER AND BALLOON DILATION N/A 04/13/2018   Procedure: MICROLARYNGOSCOPY WITH BALLOON DILATION;  Surgeon: Melida Quitter, MD;  Location: [facility];  Service: ENT;  Laterality: N/A;  . NEPHROLITHOTOMY Left 04/07/2017   Procedure: NEPHROLITHOTOMY PERCUTANEOUS WITH SURGEON ACCESS;  Surgeon: Ardis Hughs, MD;  Location: [facility];  Service: Urology;  Laterality: Left;  . ORIF HUMERUS FRACTURE  05/30/2011   Procedure: OPEN REDUCTION INTERNAL FIXATION (ORIF) PROXIMAL HUMERUS FRACTURE;  Surgeon: Augustin Schooling;  Location: [facility];  Service: Orthopedics;  Laterality: Left;  open reduction internal fixation of proximal humerus fracture  . PLACEMENT OF CENTRIMAG VENTRICULAR ASSIST DEVICE Right 07/11/2017   Procedure:  PLACEMENT OF CENTRIMAG VENTRICULAR ASSIST DEVICE;  Surgeon: Ivin Poot, MD;  Location: [facility];  Service: Open Heart Surgery;  Laterality: Right;  . REMOVAL OF CENTRIMAG VENTRICULAR ASSIST DEVICE N/A 07/17/2017   Procedure: REMOVAL OF RVAD WITH PUMP STANDBY;  Surgeon: Ivin Poot, MD;  Location: [place];  Service: Open Heart Surgery;  Laterality: N/A;  . REPLACEMENT ASCENDING AORTA N/A 07/10/2017   Procedure: REPLACEMENT ASCENDING AORTA;  Surgeon: Ivin Poot, MD;  Location: [facility];  Service: Open Heart Surgery;  Laterality: N/A;  . RIGHT/LEFT HEART CATH AND CORONARY ANGIOGRAPHY N/A 06/13/2017   Procedure: RIGHT/LEFT HEART CATH AND CORONARY ANGIOGRAPHY;  Surgeon: Jolaine Artist, MD;  Location: [facility] CV [facility];  Service: Cardiovascular;  Laterality: N/A;  . TEE WITHOUT CARDIOVERSION  N/A 07/10/2017   Procedure: TRANSESOPHAGEAL ECHOCARDIOGRAM (TEE);  Surgeon: [name], Collier Salina, MD;  Location: [facility];  Service: Open Heart Surgery;  Laterality: N/A;  . TEE WITHOUT CARDIOVERSION  07/11/2017   Procedure: TRANSESOPHAGEAL ECHOCARDIOGRAM (TEE);  Surgeon: [name], Collier Salina, MD;  Location: [place] [facility];  Service: Open Heart Surgery;;  . TEE WITHOUT CARDIOVERSION N/A 07/17/2017   Procedure: TRANSESOPHAGEAL ECHOCARDIOGRAM (TEE);  Surgeon: [name], Collier Salina, MD;  Location: [place];  Service: Open Heart Surgery;  Laterality: N/A;  . THYROID LOBECTOMY Left 07/27/2015   Procedure: LEFT THYROID LOBECTOMY;  Surgeon: Fanny Skates, MD;  [facility];  Service: General;  Laterality: Left;  . THYROIDECTOMY N/A 08/06/2015   Procedure: RIGHT THYROID LOBECTOMY, REIMPLANTATION PARATHYROID;  Surgeon: Fanny Skates, MD;  [facility];  Service: General;  Laterality: N/A;  . TOTAL KNEE ARTHROPLASTY Right 08/2009   Dr. Gladstone Pih  . TRACHEOSTOMY  08/08/2017  . VENTRAL HERNIA REPAIR  2/99     OB History   None      Home Medications    Prior to Admission medications   Medication Sig Start Date End Date Taking?  Authorizing Provider  aspirin 81 MG chewable tablet Chew 81 mg by mouth at bedtime.     [provider]  calcium carbonate (OS-CAL - DOSED IN MG OF ELEMENTAL CALCIUM) 1250 (500 Ca) MG tablet Take 1 tablet (500 mg of elemental calcium total) by mouth daily with breakfast. 12/28/17   Tat, Shanon Brow, MD  clonazePAM (KLONOPIN) 0.25 MG disintegrating tablet TAKE 1 TABLET BY MOUTH 3 TIMES A DAY Patient taking differently: Take 0.25 mg by mouth 2 (two) times daily.  03/01/18   Chipper Herb, MD  clorazepate (TRANXENE) 7.5 MG tablet Take 0.5 tab TID PRN 03/28/18   Chipper Herb, MD  furosemide (LASIX) 20 MG tablet Take 20 mg by mouth daily.     [provider]  guaiFENesin (ROBITUSSIN) 100 MG/5ML SOLN Take 5 mLs (100 mg total) by mouth 3 (three) times daily with meals. 09/01/17   Angiulli, Lavon Paganini, PA-C  latanoprost (XALATAN) 0.005 % ophthalmic solution Place 1 drop into both eyes at bedtime. 09/01/17   Angiulli, Lavon Paganini, PA-C  levothyroxine (SYNTHROID, LEVOTHROID) 100 MCG tablet Take 100 mcg by mouth See admin instructions. Take one tablet by mouth every other day (alternates doses)    [provider]  levothyroxine (SYNTHROID, LEVOTHROID) 112 MCG tablet Take 112 mcg by mouth every other day. Alternates doses    [provider]  loratadine (CLARITIN) 10 MG tablet Take 1 tablet (10 mg total) by mouth daily. Patient taking differently: Take 10 mg by mouth every evening.  11/22/17   Chipper Herb, MD  magnesium oxide (MAG-OX) 400 (241.3 Mg) MG tablet Take 1 tablet (400 mg total) by mouth daily. Patient taking differently: Take 400 mg by mouth 2 (two) times daily.  12/28/17   Orson Eva, MD  Multiple Vitamin (MULTIVITAMIN WITH MINERALS) TABS tablet Take 1 tablet by mouth daily.    [provider]  potassium chloride (K-DUR) 10 MEQ tablet Take 2 tablets (20 mEq total) by mouth 2 (two) times daily. 02/21/18   Chipper Herb, MD  PROCTOZONE-HC 2.5 % rectal cream APPLY RECTALLY  2 TIMES A DAY AS NEEDED 11/21/17   Chipper Herb, MD  QUEtiapine (SEROQUEL) 25 MG tablet TAKE 3 TABLETS AT BEDTIME AS DIRECTED 03/08/18   Evelina Dun A, FNP  venlafaxine (EFFEXOR) 37.5 MG tablet Take 1 tablet (37.5 mg total)  by mouth 2 (two) times daily. 12/27/17   Chipper Herb, MD    Family History Family History  Problem Relation Age of Onset  . Cancer Mother        PANCREATIC   . Osteoporosis Mother   . Hip fracture Mother   . Heart attack Father 63       Died 68  . Heart disease Father   . Arthritis Father   . Hypertension Sister   . Cancer Sister        skin  . Hypertension Sister   . Cancer Sister        breast cancer  . Breast cancer Neg Hx     Social History Social History   Tobacco Use  . Smoking status: Never Smoker  . Smokeless tobacco: Never Used  Substance Use Topics  . Alcohol use: No  . Drug use: Never     Allergies   Actonel [risedronate]; Lipitor [atorvastatin calcium]; Lyrica [pregabalin]; Sibutramine hcl monohydrate; Alendronate sodium; Latex; Levofloxacin; and Meloxicam   Review of Systems Review of Systems  Musculoskeletal: Positive for arthralgias and myalgias.  Skin: Positive for wound.  All other systems reviewed and are negative.    Physical Exam Updated Vital Signs BP (!) 123/102   Pulse 85   Temp 98.3 F (36.8 C) (Oral)   Resp (!) 22   SpO2 98%   Physical Exam  Constitutional: She is oriented to person, place, and time. She appears well-developed and well-nourished. No distress.  HENT:  Head: Normocephalic.  Dried blood in bilateral nares.  No septal hematoma.  No hemotympanum, battle sign or raccoon eyes.  Point area of bleeding just above the left lip.  No lacerations to the inside of the lip.  Neck:  Trach.  Cardiovascular: Normal rate, regular rhythm and normal heart sounds.  No murmur heard. Pulmonary/Chest: Effort normal and breath sounds normal. No respiratory distress.  Abdominal: Soft. She exhibits no  distension. There is no tenderness.  Musculoskeletal:  No tenderness to bilateral upper extremities or right lower extremity.  She does have diffuse tenderness of the left knee.  Full range of motion without any difficulty and ligaments appear intact.  5/5 muscle strength in all 4 extremities.  No C/T/L midline tenderness.  Neurological: She is alert and oriented to person, place, and time.  Speech clear and goal oriented. CN 2-12 grossly intact. Normal finger-to-nose and rapid alternating movements. No drift. Strength and sensation intact.   Skin: Skin is warm and dry.  3 skin tears to the left forearm.   Nursing note and vitals reviewed.    ED Treatments / Results  Labs (all labs ordered are listed, but only abnormal results are displayed) Labs Reviewed - No data to display  EKG None  Radiology Ct Head Wo Contrast  Result Date: 05/23/2018 CLINICAL DATA:  [place] off ramp at nursing home today, fell face first, loss of consciousness, nasal and mouth pain, headache, history thyroid cancer, asthma, hypertension EXAM: CT HEAD WITHOUT CONTRAST CT MAXILLOFACIAL WITHOUT CONTRAST CT CERVICAL SPINE WITHOUT CONTRAST TECHNIQUE: Multidetector CT imaging of the head, cervical spine, and maxillofacial structures were performed using the standard protocol without intravenous contrast. Multiplanar CT image reconstructions of the cervical spine and maxillofacial structures were also generated. Right side of face marked with BB. COMPARISON:  Cervical spine radiographs 07/12/2012, CT head 05/18/2011 FINDINGS: CT HEAD FINDINGS Brain: Generalized atrophy. Normal ventricular morphology. No midline shift or mass effect. Small vessel chronic ischemic changes of deep cerebral  white matter. No intracranial hemorrhage, mass lesion, evidence of acute infarction, or extra-axial fluid collection. Vascular: Minimal atherosclerotic calcification of internal carotid arteries at skull base Skull: Small LEFT supraorbital scalp  hematoma.  Calvaria intact. Other: N/A CT MAXILLOFACIAL FINDINGS Osseous: Beam hardening artifacts of dental origin. TMJ alignment normal bilaterally. Nasal septal deviation to the RIGHT. Zygomas and orbits intact. No facial bone fractures identified. Orbits: Bony orbits intact.  Intraorbital soft tissue planes clear. Sinuses: Paranasal sinuses, mastoid air cells, and middle ear cavities clear Soft tissues: Small LEFT supraorbital scalp hematoma. CT CERVICAL SPINE FINDINGS Alignment: Cervical spine assessment degraded by motion artifacts at C4-C6. Alignments grossly normal. Skull base and vertebrae: Osseous mineralization normal. Skull base intact. Vertebral body heights maintained. Disc space narrowing and endplate spur formation at C5-C6 and C6-C7. Multilevel facet degenerative changes. Within limitations of motion artifacts, no fracture, subluxation, or bone destruction identified. Soft tissues and spinal canal: Prevertebral soft tissues normal thickness Disc levels:  No additional abnormalities Upper chest: Tips of lung apices clear. Other: Endotracheal tube terminates in the trachea. Surgical clips in the anterior neck bilaterally from prior thyroidectomy. IMPRESSION: Atrophy with small vessel chronic ischemic changes of deep cerebral white matter. No acute intracranial abnormalities. No acute facial bone abnormalities. Degenerative disc and facet disease changes of the cervical spine. No definite acute cervical spine abnormalities identified on exam limited by patient motion. Electronically Signed   By: Lavonia Dana M.D.   On: 05/23/2018 19:49   Ct Cervical Spine Wo Contrast  Result Date: 05/23/2018 CLINICAL DATA:  [place] off ramp at nursing home today, fell face first, loss of consciousness, nasal and mouth pain, headache, history thyroid cancer, asthma, hypertension EXAM: CT HEAD WITHOUT CONTRAST CT MAXILLOFACIAL WITHOUT CONTRAST CT CERVICAL SPINE WITHOUT CONTRAST TECHNIQUE: Multidetector CT imaging of the  head, cervical spine, and maxillofacial structures were performed using the standard protocol without intravenous contrast. Multiplanar CT image reconstructions of the cervical spine and maxillofacial structures were also generated. Right side of face marked with BB. COMPARISON:  Cervical spine radiographs 07/12/2012, CT head 05/18/2011 FINDINGS: CT HEAD FINDINGS Brain: Generalized atrophy. Normal ventricular morphology. No midline shift or mass effect. Small vessel chronic ischemic changes of deep cerebral white matter. No intracranial hemorrhage, mass lesion, evidence of acute infarction, or extra-axial fluid collection. Vascular: Minimal atherosclerotic calcification of internal carotid arteries at skull base Skull: Small LEFT supraorbital scalp hematoma.  Calvaria intact. Other: N/A CT MAXILLOFACIAL FINDINGS Osseous: Beam hardening artifacts of dental origin. TMJ alignment normal bilaterally. Nasal septal deviation to the RIGHT. Zygomas and orbits intact. No facial bone fractures identified. Orbits: Bony orbits intact.  Intraorbital soft tissue planes clear. Sinuses: Paranasal sinuses, mastoid air cells, and middle ear cavities clear Soft tissues: Small LEFT supraorbital scalp hematoma. CT CERVICAL SPINE FINDINGS Alignment: Cervical spine assessment degraded by motion artifacts at C4-C6. Alignments grossly normal. Skull base and vertebrae: Osseous mineralization normal. Skull base intact. Vertebral body heights maintained. Disc space narrowing and endplate spur formation at C5-C6 and C6-C7. Multilevel facet degenerative changes. Within limitations of motion artifacts, no fracture, subluxation, or bone destruction identified. Soft tissues and spinal canal: Prevertebral soft tissues normal thickness Disc levels:  No additional abnormalities Upper chest: Tips of lung apices clear. Other: Endotracheal tube terminates in the trachea. Surgical clips in the anterior neck bilaterally from prior thyroidectomy.  IMPRESSION: Atrophy with small vessel chronic ischemic changes of deep cerebral white matter. No acute intracranial abnormalities. No acute facial bone abnormalities. Degenerative disc and facet disease changes  of the cervical spine. No definite acute cervical spine abnormalities identified on exam limited by patient motion. Electronically Signed   By: Lavonia Dana M.D.   On: 05/23/2018 19:49   Dg Knee Complete 4 Views Left  Result Date: 05/23/2018 CLINICAL DATA:  Fall with knee pain EXAM: LEFT KNEE - COMPLETE 4+ VIEW COMPARISON:  02/21/2014 FINDINGS: No fracture or malalignment. Moderate degenerative change of the patellofemoral compartment. Small knee effusion. Soft tissue swelling in the infrapatellar region. Marked arthritis of the medial compartment with moderate arthritis of the lateral compartment. IMPRESSION: 1. No acute osseous abnormality. Infrapatellar soft tissue swelling and small knee effusion 2. Moderate-to-marked tricompartment arthritis of knee Electronically Signed   By: Donavan Foil M.D.   On: 05/23/2018 19:44   Ct Angio Chest Aorta W/cm &/or Wo/cm  Result Date: 05/23/2018 CLINICAL DATA:  77 year old female with history of ascending aneurysm, aortic valve insufficiency, status post median sternotomy, aortic valve replacement, ascending aorta graft 07/10/2017, re-exploration 07/11/2017 with placement of right ventricular assist device and subsequent removal 07/17/2017 EXAM: CT ANGIOGRAPHY CHEST WITH CONTRAST TECHNIQUE: Multidetector CT imaging of the chest was performed using the standard protocol during bolus administration of intravenous contrast. Multiplanar CT image reconstructions and MIPs were obtained to evaluate the vascular anatomy. CONTRAST:  89mL ISOVUE-370 IOPAMIDOL (ISOVUE-370) INJECTION 76% COMPARISON:  None. FINDINGS: Cardiovascular: Heart: Heart size unchanged. No pericardial fluid/thickening. No significant coronary calcifications. Surgical changes of prior median  sternotomy and aortic valve replacement with ascending aortic graft for repair of ascending aneurysm. Unremarkable appearance of the surgical anastomosis. Aorta: Surgical repair of ascending aorta again noted with unremarkable appearance of the surgical anastomosis. Similar configuration of the aortic arch. Diameter of the distal arch is essentially unchanged from the comparison CT measuring 3.5 cm at the distal arch and 2.9 cm in the proximal descending aorta. Aorta at the hiatus measures 2.6 cm. No periaortic fluid or inflammatory changes. Branch vessels are patent with common origin of the innominate artery an the left common carotid artery. Pulmonary arteries: No central, lobar, segmental, or proximal subsegmental filling defects. Mediastinum/Nodes: No adenopathy. Redemonstration of surgical changes of median sternotomy. Tracheostomy in position within the trachea, unremarkable appearance. Unremarkable thoracic inlet. Unremarkable appearance of the course of the esophagus with small hiatal hernia. Lungs/Pleura: Central airways are clear. No pleural effusion. No confluent airspace disease. Atelectatic changes/scarring at the right lung base. Upper Abdomen: No acute finding of the upper abdomen. Unchanged splenic artery aneurysm. Musculoskeletal: No acute displaced fracture. Accentuated kyphotic curvature. Degenerative changes of the thoracic spine. Review of the MIP images confirms the above findings. IMPRESSION: Interval postsurgical changes of median sternotomy, aortic valve repair, and tube graft of ascending aorta for repair of aneurysm. Unchanged appearance of the ectatic aortic arch with no significant interval enlargement of the descending aorta. Signed, Dulcy Fanny. Dellia Nims, RPVI [facility] Radiology Electronically Signed   By: Corrie Mckusick D.O.   On: 05/23/2018 14:37   Ct Maxillofacial Wo Contrast  Result Date: 05/23/2018 CLINICAL DATA:  [place] off  ramp at nursing home today, fell face first, loss of consciousness, nasal and mouth pain, headache, history thyroid cancer, asthma, hypertension EXAM: CT HEAD WITHOUT CONTRAST CT MAXILLOFACIAL WITHOUT CONTRAST CT CERVICAL SPINE WITHOUT CONTRAST TECHNIQUE: Multidetector CT imaging of the head, cervical spine, and maxillofacial structures were performed using the standard protocol without intravenous contrast. Multiplanar CT image reconstructions of the cervical spine and maxillofacial structures were also generated. Right side of face marked with BB.  COMPARISON:  Cervical spine radiographs 07/12/2012, CT head 05/18/2011 FINDINGS: CT HEAD FINDINGS Brain: Generalized atrophy. Normal ventricular morphology. No midline shift or mass effect. Small vessel chronic ischemic changes of deep cerebral white matter. No intracranial hemorrhage, mass lesion, evidence of acute infarction, or extra-axial fluid collection. Vascular: Minimal atherosclerotic calcification of internal carotid arteries at skull base Skull: Small LEFT supraorbital scalp hematoma.  Calvaria intact. Other: N/A CT MAXILLOFACIAL FINDINGS Osseous: Beam hardening artifacts of dental origin. TMJ alignment normal bilaterally. Nasal septal deviation to the RIGHT. Zygomas and orbits intact. No facial bone fractures identified. Orbits: Bony orbits intact.  Intraorbital soft tissue planes clear. Sinuses: Paranasal sinuses, mastoid air cells, and middle ear cavities clear Soft tissues: Small LEFT supraorbital scalp hematoma. CT CERVICAL SPINE FINDINGS Alignment: Cervical spine assessment degraded by motion artifacts at C4-C6. Alignments grossly normal. Skull base and vertebrae: Osseous mineralization normal. Skull base intact. Vertebral body heights maintained. Disc space narrowing and endplate spur formation at C5-C6 and C6-C7. Multilevel facet degenerative changes. Within limitations of motion artifacts, no fracture, subluxation, or bone destruction identified. Soft  tissues and spinal canal: Prevertebral soft tissues normal thickness Disc levels:  No additional abnormalities Upper chest: Tips of lung apices clear. Other: Endotracheal tube terminates in the trachea. Surgical clips in the anterior neck bilaterally from prior thyroidectomy. IMPRESSION: Atrophy with small vessel chronic ischemic changes of deep cerebral white matter. No acute intracranial abnormalities. No acute facial bone abnormalities. Degenerative disc and facet disease changes of the cervical spine. No definite acute cervical spine abnormalities identified on exam limited by patient motion. Electronically Signed   By: Lavonia Dana M.D.   On: 05/23/2018 19:49    Procedures Procedures (including critical care time)  Medications Ordered in ED Medications - No data to display   Initial Impression / Assessment and Plan / ED Course  I have reviewed the triage vital signs and the nursing notes.  Pertinent labs & imaging results that were available during my care of the patient were reviewed by me and considered in my medical decision making (see chart for details).    MATTISON GOLAY is a 77 y.o. female who presents to ED for evaluation after mechanical fall just prior to arrival.  No focal neuro deficits appreciated.  She does have 3 skin tears to the left forearm which were repaired with Steri-Strips after thorough irrigation.  Her tetanus is up-to-date.  CT scans of head/C-spine/maxillofacial without signs of acute injury.  Plain film of the knee without bony abnormalities as well. Evaluation does not show pathology that would require ongoing emergent intervention or inpatient treatment.  Symptomatic home care instructions, follow-up care and return precautions were discussed.  All questions answered.  Patient seen by and discussed with Dr. Ashok Cordia who agrees with treatment plan.    Final Clinical Impressions(s) / ED Diagnoses   Final diagnoses:  Fall, initial encounter  Injury of head,  initial encounter  Skin tear of left forearm without complication, initial encounter  Acute pain of left knee    ED Discharge Orders    None       Vale Mousseau, Ozella Almond, PA-C 05/23/18 [id number]    Lajean Saver, MD 05/23/18 2354

## 2018-05-23 NOTE — ED Triage Notes (Addendum)
Pt arrived via [facility] from shopping where the pt had a mechanical fall while walking across a parking lot. Pt fell and partially caught herself with her left hand which resulted in some abrasions and skin tears. Pt also struck her face against the ground, injuring her nose. Swelling and deformity noted at time of triage. Bleeding controlled at this time. Pt denies LOC or N/V. Pt is alert and oriented x4, able to recall event.

## 2018-05-23 NOTE — ED Notes (Signed)
Patient verbalizes understanding of discharge instructions. Opportunity for questioning and answers were provided. Armband removed by staff, pt discharged from ED.  

## 2018-05-29 ENCOUNTER — Ambulatory Visit (INDEPENDENT_AMBULATORY_CARE_PROVIDER_SITE_OTHER): Payer: Medicare Other | Admitting: Family Medicine

## 2018-05-29 ENCOUNTER — Encounter: Payer: Self-pay | Admitting: Family Medicine

## 2018-05-29 VITALS — BP 125/70 | HR 84 | Temp 97.4°F | Ht 65.0 in | Wt 155.0 lb

## 2018-05-29 DIAGNOSIS — S61512D Laceration without foreign body of left wrist, subsequent encounter: Secondary | ICD-10-CM

## 2018-05-29 NOTE — Progress Notes (Signed)
Subjective:    Patient ID: [name], female    DOB: 1940-09-23, 77 y.o.   MRN: [id number]  Chief Complaint:  ER follow up   HPI: [name] is a 77 y.o. female presenting on 05/29/2018 for ER follow up   1. Tear of skin of left wrist, subsequent encounter   Pt presents today for a follow up from [facility] ED on 05-23-18 after a fall. Pt states she is here to have the wound on her left wrist looked at and redressed. Pt fell injuring her face, left knee, and left wrist. Pt has a negative CT of her head, C-Spine, facial bones, and chest. She also had a negative xray of her left knee. Pt states steri strips were applied to the skin tear on her left wrist. She states the area is sore. Denies fever, chills, or purulent drainage.    Relevant past medical, surgical, family, and social history reviewed and updated as indicated.  Allergies and medications reviewed and updated.   Past Medical History:  Diagnosis Date  . ACL tear    right  Dr. Gladstone Pih   . Adenomatous polyp   . Anxiety   . Arthritis    "all over" (04/12/2018)  . Ascending aortic aneurysm ([place])    note per chart per Dr Lucianne Lei Tright 4.7 cm 04/15/2015   . Asthma   . B12 deficiency   . Chronic bronchitis ([place])   . Depression   . DUB (dysfunctional uterine bleeding) 10/96  . Fibromyalgia   . GERD (gastroesophageal reflux disease)   . Glaucoma    bilaterally  . Helicobacter pylori (H. pylori)   . Hiatal hernia   . History of blood transfusion 06/2017   "related to OHS"  . History of kidney stones   . History of left shoulder fracture    pt states fell off bed and broke ball in shoulder had rod placed   . Hyperlipidemia   . Hypertension   . Hypothyroidism   . Inspiratory stridor   . Laryngeal stenosis   . Occasional tremors    left arm   . Osteoarthritis   . Osteopenia   . Osteoporosis   . Thyroid cancer ([place]) 2017  . Thyroid nodule   . Tracheostomy in place [facility])     Past Surgical History:  Procedure  Laterality Date  . AORTIC VALVE REPLACEMENT N/A 07/10/2017   Procedure: AORTIC VALVE REPLACEMENT (AVR);  Surgeon: [name], Collier Salina, MD;  Location: [place];  Service: Open Heart Surgery;  Laterality: N/A;  . APPENDECTOMY    . BUNIONECTOMY  08/1999   right - Dr. Irving Shows   . CARDIAC VALVE REPLACEMENT    . CATARACT EXTRACTION, BILATERAL Bilateral 05/28/07-2009   "right-left"  . CHOLECYSTECTOMY OPEN  1979  . DILATION AND CURETTAGE OF UTERUS  03/31/95   Dr. Ovid Curd   . DIRECT LARYNGOSCOPY WITH BOTOX INJECTION N/A 12/01/2017   Procedure: SUSPENDED DIRECT MICROLARYNGOSCOPY WITH KENALOG INJECTION, CO2 LASER;  Surgeon: Melida Quitter, MD;  Location: [place];  Service: ENT;  Laterality: N/A;  . EXCISIONAL HEMORRHOIDECTOMY    . EXPLORATION POST OPERATIVE OPEN HEART N/A 07/11/2017   Procedure: EXPLORATION POST OPERATIVE OPEN HEART;  Surgeon: Ivin Poot, MD;  Location: [place];  Service: Open Heart Surgery;  Laterality: N/A;  . EYE SURGERY     also had left cataract removed   . FRACTURE SURGERY    . HEMATOMA EVACUATION N/A 07/11/2017   Procedure: EVACUATION HEMATOMA;  Surgeon: [name], Collier Salina, MD;  Location: [place];  Service: Open Heart Surgery;  Laterality: N/A;  . HERNIA REPAIR    . JOINT REPLACEMENT    . MICROLARYNGOSCOPY WITH LASER AND BALLOON DILATION N/A 01/24/2018   Procedure: MICROLARYNGOSCOPY WITH LASER AND BALLOON DILATION;  Surgeon: Melida Quitter, MD;  Location: [place];  Service: ENT;  Laterality: N/A;  . MICROLARYNGOSCOPY WITH LASER AND BALLOON DILATION N/A 04/13/2018   Procedure: MICROLARYNGOSCOPY WITH BALLOON DILATION;  Surgeon: Melida Quitter, MD;  Location: [facility] OR;  Service: ENT;  Laterality: N/A;  . NEPHROLITHOTOMY Left 04/07/2017   Procedure: NEPHROLITHOTOMY PERCUTANEOUS WITH SURGEON ACCESS;  Surgeon: Ardis Hughs, MD;  [facility];  Service: Urology;  Laterality: Left;  . ORIF HUMERUS FRACTURE  05/30/2011   Procedure: OPEN REDUCTION INTERNAL FIXATION (ORIF) PROXIMAL HUMERUS FRACTURE;   Surgeon: Augustin Schooling;  Location: [place];  Service: Orthopedics;  Laterality: Left;  open reduction internal fixation of proximal humerus fracture  . PLACEMENT OF CENTRIMAG VENTRICULAR ASSIST DEVICE Right 07/11/2017   Procedure: PLACEMENT OF CENTRIMAG VENTRICULAR ASSIST DEVICE;  Surgeon: Ivin Poot, MD;  Location: [place];  Service: Open Heart Surgery;  Laterality: Right;  . REMOVAL OF CENTRIMAG VENTRICULAR ASSIST DEVICE N/A 07/17/2017   Procedure: REMOVAL OF RVAD WITH PUMP STANDBY;  Surgeon: Ivin Poot, MD;  Location: [place];  Service: Open Heart Surgery;  Laterality: N/A;  . REPLACEMENT ASCENDING AORTA N/A 07/10/2017   Procedure: REPLACEMENT ASCENDING AORTA;  Surgeon: Ivin Poot, MD;  Location: [place];  Service: Open Heart Surgery;  Laterality: N/A;  . RIGHT/LEFT HEART CATH AND CORONARY ANGIOGRAPHY N/A 06/13/2017   Procedure: RIGHT/LEFT HEART CATH AND CORONARY ANGIOGRAPHY;  Surgeon: Jolaine Artist, MD;  Location: [place] [facility];  Service: Cardiovascular;  Laterality: N/A;  . TEE WITHOUT CARDIOVERSION N/A 07/10/2017   Procedure: TRANSESOPHAGEAL ECHOCARDIOGRAM (TEE);  Surgeon: [name], Collier Salina, MD;  Location: [place];  Service: Open Heart Surgery;  Laterality: N/A;  . TEE WITHOUT CARDIOVERSION  07/11/2017   Procedure: TRANSESOPHAGEAL ECHOCARDIOGRAM (TEE);  Surgeon: [name], Collier Salina, MD;  Location: [place];  Service: Open Heart Surgery;;  . TEE WITHOUT CARDIOVERSION N/A 07/17/2017   Procedure: TRANSESOPHAGEAL ECHOCARDIOGRAM (TEE);  Surgeon: [name], Collier Salina, MD;  Location: [place];  Service: Open Heart Surgery;  Laterality: N/A;  . THYROID LOBECTOMY Left 07/27/2015   Procedure: LEFT THYROID LOBECTOMY;  Surgeon: Fanny Skates, MD;  [facility];  Service: General;  Laterality: Left;  . THYROIDECTOMY N/A 08/06/2015   Procedure: RIGHT THYROID LOBECTOMY, REIMPLANTATION PARATHYROID;  Surgeon: Fanny Skates, MD;  [facility];  Service: General;  Laterality: N/A;  . TOTAL KNEE  ARTHROPLASTY Right 08/2009   Dr. Gladstone Pih  . TRACHEOSTOMY  08/08/2017  . VENTRAL HERNIA REPAIR  2/99    Social History   Socioeconomic History  . Marital status: Married    Spouse name: Not on file  . Number of children: 1  . Years of education: 47  . Highest education level: 11th grade  Occupational History  . Occupation: homemaker  Social Needs  . Financial resource strain: Not hard at all  . Food insecurity:    Worry: Never true    Inability: Never true  . Transportation needs:    Medical: No    Non-medical: No  Tobacco Use  . Smoking status: Never Smoker  . Smokeless tobacco: Never Used  Substance and Sexual Activity  . Alcohol use: No  . Drug use: Never  .  Sexual activity: Not Currently    Birth control/protection: Abstinence  Lifestyle  . Physical activity:    Days per week: 0 days    Minutes per session: 0 min  . Stress: Only a little  Relationships  . Social connections:    Talks on phone: More than three times a week    Gets together: More than three times a week    Attends religious service: Never    Active member of club or organization: No    Attends meetings of clubs or organizations: Never    Relationship status: Married  . Intimate partner violence:    Fear of current or ex partner: Not on file    Emotionally abused: Not on file    Physically abused: Not on file    Forced sexual activity: Not on file  Other Topics Concern  . Not on file  Social History Narrative   Lives with husband.     Outpatient Encounter Medications as of 05/29/2018  Medication Sig  . aspirin 81 MG chewable tablet Chew 81 mg by mouth at bedtime.   . calcium carbonate (OS-CAL - DOSED IN MG OF ELEMENTAL CALCIUM) 1250 (500 Ca) MG tablet Take 1 tablet (500 mg of elemental calcium total) by mouth daily with breakfast.  . clonazePAM (KLONOPIN) 0.25 MG disintegrating tablet TAKE 1 TABLET BY MOUTH 3 TIMES A DAY (Patient taking differently: Take 0.25 mg by mouth 2 (two) times  daily. )  . clorazepate (TRANXENE) 7.5 MG tablet Take 0.5 tab TID PRN  . furosemide (LASIX) 20 MG tablet Take 20 mg by mouth daily.   Marland Kitchen guaiFENesin (ROBITUSSIN) 100 MG/5ML SOLN Take 5 mLs (100 mg total) by mouth 3 (three) times daily with meals.  . latanoprost (XALATAN) 0.005 % ophthalmic solution Place 1 drop into both eyes at bedtime.  Marland Kitchen levothyroxine (SYNTHROID, LEVOTHROID) 100 MCG tablet Take 100 mcg by mouth See admin instructions. Take one tablet by mouth every other day (alternates doses)  . levothyroxine (SYNTHROID, LEVOTHROID) 112 MCG tablet Take 112 mcg by mouth every other day. Alternates doses  . loratadine (CLARITIN) 10 MG tablet Take 1 tablet (10 mg total) by mouth daily. (Patient taking differently: Take 10 mg by mouth every evening. )  . magnesium oxide (MAG-OX) 400 (241.3 Mg) MG tablet Take 1 tablet (400 mg total) by mouth daily. (Patient taking differently: Take 400 mg by mouth 2 (two) times daily. )  . Multiple Vitamin (MULTIVITAMIN WITH MINERALS) TABS tablet Take 1 tablet by mouth daily.  . potassium chloride (K-DUR) 10 MEQ tablet Take 2 tablets (20 mEq total) by mouth 2 (two) times daily.  Marland Kitchen PROCTOZONE-HC 2.5 % rectal cream APPLY RECTALLY 2 TIMES A DAY AS NEEDED  . QUEtiapine (SEROQUEL) 25 MG tablet TAKE 3 TABLETS AT BEDTIME AS DIRECTED  . venlafaxine (EFFEXOR) 37.5 MG tablet Take 1 tablet (37.5 mg total) by mouth 2 (two) times daily.   Facility-Administered Encounter Medications as of 05/29/2018  Medication  . cyanocobalamin ((VITAMIN B-12)) injection 1,000 mcg    Allergies  Allergen Reactions  . Actonel [Risedronate] Other (See Comments)    Bone pain and nausea   . Lipitor [Atorvastatin Calcium] Other (See Comments)    myalgias  . Lyrica [Pregabalin] Other (See Comments)    SORES IN MOUTH   . Sibutramine Hcl Monohydrate Other (See Comments)    UNSPECIFIED REACTION   . Alendronate Sodium Nausea Only  . Latex Rash and Other (See Comments)    RA to latex 1982;  includes bandaids    . Levofloxacin Nausea And Vomiting  . Meloxicam Other (See Comments)    Vaginal itching     Review of Systems  Constitutional: Negative for chills, fatigue and fever.  HENT: Negative for trouble swallowing.   Respiratory: Negative for cough and shortness of breath.   Cardiovascular: Negative for chest pain and palpitations.  Musculoskeletal: Positive for myalgias.  Skin: Positive for wound (left wrist ).       Bruising to face and left forearm  Neurological: Negative for dizziness, tremors, weakness, light-headedness, numbness and headaches.  Psychiatric/Behavioral: Negative for confusion.  All other systems reviewed and are negative.       Objective:    BP 125/70   Pulse 84   Temp (!) 97.4 F (36.3 C) (Oral)   Ht 5\' 5"  (1.651 m)   Wt 155 lb (70.3 kg)   BMI 25.79 kg/m    Wt Readings from Last 3 Encounters:  05/29/18 155 lb (70.3 kg)  05/23/18 155 lb (70.3 kg)  05/02/18 149 lb (67.6 kg)    Physical Exam  Constitutional: She is oriented to person, place, and time. She appears well-developed and well-nourished. She is cooperative. No distress.  Eyes: Pupils are equal, round, and reactive to light. Conjunctivae and EOM are normal.  Neck:  Trach in place without noted secretions or dried blood   Cardiovascular: Normal rate and regular rhythm.  Pulses:      Radial pulses are 2+ on the right side, and 2+ on the left side.  Pulmonary/Chest: Effort normal and breath sounds normal.  Neurological: She is alert and oriented to person, place, and time.  Skin: Skin is warm and dry. Capillary refill takes less than 2 seconds. Abrasion and ecchymosis noted.     Bruising to face and left forearm, greenish-yellow to purple in color. Abrasion to top of lip without erythema or drainage. Healing skin tear to left forearm, steri strips intact, no drainage, swelling, or erythema. Healing abrasion to left index finger without erythema, swelling, or drainage.     Psychiatric: She has a normal mood and affect. Her speech is normal and behavior is normal. Judgment and thought content normal. Cognition and memory are normal.  Nursing note and vitals reviewed.   Results for orders placed or performed during the hospital encounter of 04/11/18  Surgical pcr screen  Result Value Ref Range   MRSA, PCR NEGATIVE NEGATIVE   Staphylococcus aureus NEGATIVE NEGATIVE  CBC  Result Value Ref Range   WBC 4.8 4.0 - 10.5 K/uL   RBC 4.34 3.87 - 5.11 MIL/uL   Hemoglobin 12.4 12.0 - 15.0 g/dL   HCT 39.0 36.0 - 46.0 %   MCV 89.9 80.0 - 100.0 fL   MCH 28.6 26.0 - 34.0 pg   MCHC 31.8 30.0 - 36.0 g/dL   RDW 12.9 11.5 - 15.5 %   Platelets 213 150 - 400 K/uL   nRBC 0.0 0.0 - 0.2 %  Basic metabolic panel  Result Value Ref Range   Sodium 141 135 - 145 mmol/L   Potassium 3.8 3.5 - 5.1 mmol/L   Chloride 103 98 - 111 mmol/L   CO2 31 22 - 32 mmol/L   Glucose, Bld 110 (H) 70 - 99 mg/dL   BUN 9 8 - 23 mg/dL   Creatinine, Ser 0.79 0.44 - 1.00 mg/dL   Calcium 8.8 (L) 8.9 - 10.3 mg/dL   GFR calc non Af Amer >60 >60 mL/min   GFR calc Af Amer >  60 >60 mL/min   Anion gap 7 5 - 15       Pertinent labs & imaging results that were available during my care of the patient were reviewed by me and considered in my medical decision making.  Assessment & Plan:  Tanicia was seen today for er follow up.  Diagnoses and all orders for this visit:  Tear of skin of left wrist, subsequent encounter Healing without signs of infection / cellulitis. Wound care discussed. Signs and symptoms that warrant further evaluation discussed.   Continue all other maintenance medications.  Follow up plan: Return if symptoms worsen or fail to improve.  Educational handout given for wound care  The above assessment and management plan was discussed with the patient. The patient verbalized understanding of and has agreed to the management plan. Patient is aware to call the clinic if symptoms  persist or worsen. Patient is aware when to return to the clinic for a follow-up visit. Patient educated on when it is appropriate to go to the emergency department.   Monia Pouch, FNP-C [facility] [phone]

## 2018-05-29 NOTE — Patient Instructions (Signed)

## 2018-06-03 DIAGNOSIS — R5381 Other malaise: Secondary | ICD-10-CM | POA: Diagnosis not present

## 2018-06-03 DIAGNOSIS — Z930 Tracheostomy status: Secondary | ICD-10-CM | POA: Diagnosis not present

## 2018-06-04 DIAGNOSIS — Z930 Tracheostomy status: Secondary | ICD-10-CM | POA: Diagnosis not present

## 2018-06-04 DIAGNOSIS — R5381 Other malaise: Secondary | ICD-10-CM | POA: Diagnosis not present

## 2018-06-07 ENCOUNTER — Other Ambulatory Visit: Payer: Self-pay | Admitting: Family Medicine

## 2018-06-07 ENCOUNTER — Other Ambulatory Visit: Payer: Self-pay | Admitting: Family

## 2018-06-11 ENCOUNTER — Other Ambulatory Visit: Payer: Self-pay | Admitting: *Deleted

## 2018-06-11 MED ORDER — CLONAZEPAM 0.25 MG PO TBDP: ORAL_TABLET | ORAL | 5 refills | Status: DC

## 2018-06-25 DIAGNOSIS — J386 Stenosis of larynx: Secondary | ICD-10-CM | POA: Diagnosis not present

## 2018-06-25 DIAGNOSIS — I6782 Cerebral ischemia: Secondary | ICD-10-CM | POA: Diagnosis not present

## 2018-06-25 DIAGNOSIS — J398 Other specified diseases of upper respiratory tract: Secondary | ICD-10-CM | POA: Diagnosis not present

## 2018-06-25 DIAGNOSIS — Z9181 History of falling: Secondary | ICD-10-CM | POA: Diagnosis not present

## 2018-06-25 DIAGNOSIS — Z930 Tracheostomy status: Secondary | ICD-10-CM | POA: Diagnosis not present

## 2018-06-25 DIAGNOSIS — J3802 Paralysis of vocal cords and larynx, bilateral: Secondary | ICD-10-CM | POA: Diagnosis not present

## 2018-06-25 DIAGNOSIS — R490 Dysphonia: Secondary | ICD-10-CM | POA: Diagnosis not present

## 2018-07-06 ENCOUNTER — Other Ambulatory Visit: Payer: Self-pay | Admitting: Family Medicine

## 2018-07-19 DIAGNOSIS — J386 Stenosis of larynx: Secondary | ICD-10-CM | POA: Diagnosis not present

## 2018-07-19 DIAGNOSIS — J3802 Paralysis of vocal cords and larynx, bilateral: Secondary | ICD-10-CM | POA: Insufficient documentation

## 2018-07-19 DIAGNOSIS — Z48813 Encounter for surgical aftercare following surgery on the respiratory system: Secondary | ICD-10-CM | POA: Diagnosis not present

## 2018-07-19 DIAGNOSIS — R490 Dysphonia: Secondary | ICD-10-CM | POA: Insufficient documentation

## 2018-07-19 DIAGNOSIS — Z930 Tracheostomy status: Secondary | ICD-10-CM

## 2018-07-24 NOTE — Progress Notes (Signed)
Cardiology Office Note   Date:  07/25/2018   ID:  [name], DOB [birth year], MRN [id number]  PCP:  Chipper Herb, MD  Cardiologist:   No primary care provider on file.   Chief Complaint  Patient presents with  . AVR      History of Present Illness: [name] is a 78 y.o. female who presents for follow up after AVR and aortic surgery.  She was admitted in Jan 2019 for elective AVR and ascending root replacement.  Post op she had tamponade and VF arrest.  She required an RVAD.    She had a tracheostomy placed and did go to rehab.   follow up an echo demonstrated a normal EF.   There was moderate TR.  The AVR looked OK.   Since I last saw her in July she was in the hospital in October for treatment of laryngeal stenosis.    She is done well from a cardiovascular standpoint.  She starts coughing because she needs to have her trach cleaned and her husband takes care of this.  But from a cardiovascular standpoint she says she has no acute complaints.  She does her household chores.  She denies any chest pressure, neck discomfort.  She has no palpitations, presyncope or syncope.  Said no weight gain or edema.  Past Medical History:  Diagnosis Date  . ACL tear    right  Dr. Gladstone Pih   . Adenomatous polyp   . Anxiety   . Arthritis    "all over" (04/12/2018)  . Ascending aortic aneurysm ([place])    note per chart per Dr Lucianne Lei Tright 4.7 cm 04/15/2015   . Asthma   . B12 deficiency   . Chronic bronchitis ([place])   . Depression   . DUB (dysfunctional uterine bleeding) 10/96  . Fibromyalgia   . GERD (gastroesophageal reflux disease)   . Glaucoma    bilaterally  . Helicobacter pylori (H. pylori)   . Hiatal hernia   . History of blood transfusion 06/2017   "related to OHS"  . History of kidney stones   . History of left shoulder fracture    pt states fell off bed and broke ball in shoulder had rod placed   . Hyperlipidemia   . Hypertension   . Hypothyroidism   . Inspiratory  stridor   . Laryngeal stenosis   . Occasional tremors    left arm   . Osteoarthritis   . Osteopenia   . Osteoporosis   . Thyroid cancer ([place]) 2017  . Thyroid nodule   . Tracheostomy in place [facility])     Past Surgical History:  Procedure Laterality Date  . AORTIC VALVE REPLACEMENT N/A 07/10/2017   Procedure: AORTIC VALVE REPLACEMENT (AVR);  Surgeon: [name], Collier Salina, MD;  Location: [place];  Service: Open Heart Surgery;  Laterality: N/A;  . APPENDECTOMY    . BUNIONECTOMY  08/1999   right - Dr. Irving Shows   . CARDIAC VALVE REPLACEMENT    . CATARACT EXTRACTION, BILATERAL Bilateral 05/28/07-2009   "right-left"  . CHOLECYSTECTOMY OPEN  1979  . DILATION AND CURETTAGE OF UTERUS  03/31/95   Dr. Ovid Curd   . DIRECT LARYNGOSCOPY WITH BOTOX INJECTION N/A 12/01/2017   Procedure: SUSPENDED DIRECT MICROLARYNGOSCOPY WITH KENALOG INJECTION, CO2 LASER;  Surgeon: Melida Quitter, MD;  Location: [place];  Service: ENT;  Laterality: N/A;  . EXCISIONAL HEMORRHOIDECTOMY    . EXPLORATION POST OPERATIVE OPEN HEART N/A 07/11/2017   Procedure: EXPLORATION  POST OPERATIVE OPEN HEART;  Surgeon: Ivin Poot, MD;  Location: [place];  Service: Open Heart Surgery;  Laterality: N/A;  . EYE SURGERY     also had left cataract removed   . FRACTURE SURGERY    . HEMATOMA EVACUATION N/A 07/11/2017   Procedure: EVACUATION HEMATOMA;  Surgeon: Ivin Poot, MD;  Location: [place];  Service: Open Heart Surgery;  Laterality: N/A;  . HERNIA REPAIR    . JOINT REPLACEMENT    . MICROLARYNGOSCOPY WITH LASER AND BALLOON DILATION N/A 01/24/2018   Procedure: MICROLARYNGOSCOPY WITH LASER AND BALLOON DILATION;  Surgeon: Melida Quitter, MD;  Location: [place];  Service: ENT;  Laterality: N/A;  . MICROLARYNGOSCOPY WITH LASER AND BALLOON DILATION N/A 04/13/2018   Procedure: MICROLARYNGOSCOPY WITH BALLOON DILATION;  Surgeon: Melida Quitter, MD;  [facility];  Service: ENT;  Laterality: N/A;  . NEPHROLITHOTOMY Left 04/07/2017   Procedure:  NEPHROLITHOTOMY PERCUTANEOUS WITH SURGEON ACCESS;  Surgeon: Ardis Hughs, MD;  [facility];  Service: Urology;  Laterality: Left;  . ORIF HUMERUS FRACTURE  05/30/2011   Procedure: OPEN REDUCTION INTERNAL FIXATION (ORIF) PROXIMAL HUMERUS FRACTURE;  Surgeon: Augustin Schooling;  Location: [place];  Service: Orthopedics;  Laterality: Left;  open reduction internal fixation of proximal humerus fracture  . PLACEMENT OF CENTRIMAG VENTRICULAR ASSIST DEVICE Right 07/11/2017   Procedure: PLACEMENT OF CENTRIMAG VENTRICULAR ASSIST DEVICE;  Surgeon: Ivin Poot, MD;  Location: [place];  Service: Open Heart Surgery;  Laterality: Right;  . REMOVAL OF CENTRIMAG VENTRICULAR ASSIST DEVICE N/A 07/17/2017   Procedure: REMOVAL OF RVAD WITH PUMP STANDBY;  Surgeon: Ivin Poot, MD;  Location: [place];  Service: Open Heart Surgery;  Laterality: N/A;  . REPLACEMENT ASCENDING AORTA N/A 07/10/2017   Procedure: REPLACEMENT ASCENDING AORTA;  Surgeon: Ivin Poot, MD;  Location: [place];  Service: Open Heart Surgery;  Laterality: N/A;  . RIGHT/LEFT HEART CATH AND CORONARY ANGIOGRAPHY N/A 06/13/2017   Procedure: RIGHT/LEFT HEART CATH AND CORONARY ANGIOGRAPHY;  Surgeon: Jolaine Artist, MD;  Location: [place] [facility];  Service: Cardiovascular;  Laterality: N/A;  . TEE WITHOUT CARDIOVERSION N/A 07/10/2017   Procedure: TRANSESOPHAGEAL ECHOCARDIOGRAM (TEE);  Surgeon: [name], Collier Salina, MD;  Location: [place];  Service: Open Heart Surgery;  Laterality: N/A;  . TEE WITHOUT CARDIOVERSION  07/11/2017   Procedure: TRANSESOPHAGEAL ECHOCARDIOGRAM (TEE);  Surgeon: [name], Collier Salina, MD;  Location: [place];  Service: Open Heart Surgery;;  . TEE WITHOUT CARDIOVERSION N/A 07/17/2017   Procedure: TRANSESOPHAGEAL ECHOCARDIOGRAM (TEE);  Surgeon: [name], Collier Salina, MD;  Location: [place];  Service: Open Heart Surgery;  Laterality: N/A;  . THYROID LOBECTOMY Left 07/27/2015   Procedure: LEFT THYROID LOBECTOMY;  Surgeon: Fanny Skates, MD;   [facility];  Service: General;  Laterality: Left;  . THYROIDECTOMY N/A 08/06/2015   Procedure: RIGHT THYROID LOBECTOMY, REIMPLANTATION PARATHYROID;  Surgeon: Fanny Skates, MD;  [facility];  Service: General;  Laterality: N/A;  . TOTAL KNEE ARTHROPLASTY Right 08/2009   Dr. Gladstone Pih  . TRACHEOSTOMY  08/08/2017  . VENTRAL HERNIA REPAIR  2/99     Current Outpatient Medications  Medication Sig Dispense Refill  . aspirin 81 MG chewable tablet Chew 81 mg by mouth at bedtime.     . calcium carbonate (OS-CAL - DOSED IN MG OF ELEMENTAL CALCIUM) 1250 (500 Ca) MG tablet Take 1 tablet (500 mg of elemental calcium total) by mouth daily with breakfast. 30 tablet 0  . clonazePAM (KLONOPIN) 0.25  MG disintegrating tablet DISSOLVE 1 TABLET BY MOUTH 3 TIMES A DAY 90 tablet 5  . furosemide (LASIX) 20 MG tablet Take 20 mg by mouth daily.     Marland Kitchen guaiFENesin (ROBITUSSIN) 100 MG/5ML SOLN Take 5 mLs (100 mg total) by mouth 3 (three) times daily with meals. 1200 mL 0  . latanoprost (XALATAN) 0.005 % ophthalmic solution Place 1 drop into both eyes at bedtime. 2.5 mL 12  . levothyroxine (SYNTHROID, LEVOTHROID) 100 MCG tablet Take 100 mcg by mouth See admin instructions. Take one tablet by mouth every other day (alternates doses)    . levothyroxine (SYNTHROID, LEVOTHROID) 112 MCG tablet Take 112 mcg by mouth every other day. Alternates doses    . loratadine (CLARITIN) 10 MG tablet Take 1 tablet (10 mg total) by mouth daily. (Patient taking differently: Take 10 mg by mouth every evening. ) 30 tablet 11  . magnesium oxide (MAG-OX) 400 (241.3 Mg) MG tablet Take 1 tablet (400 mg total) by mouth daily. (Patient taking differently: Take 400 mg by mouth 2 (two) times daily. ) 30 tablet 0  . Multiple Vitamin (MULTIVITAMIN WITH MINERALS) TABS tablet Take 1 tablet by mouth daily.    . potassium chloride (K-DUR) 10 MEQ tablet Take 2 tablets (20 mEq total) by mouth 2 (two) times daily. 120 tablet 11  . PROCTOZONE-HC 2.5 %  rectal cream APPLY RECTALLY 2 TIMES A DAY AS NEEDED 30 g 2  . QUEtiapine (SEROQUEL) 25 MG tablet TAKE 3 TABLETS AT BEDTIME AS DIRECTED 90 tablet 5  . venlafaxine (EFFEXOR) 37.5 MG tablet Take 1 tablet (37.5 mg total) by mouth 2 (two) times daily. 60 tablet 11   Current Facility-Administered Medications  Medication Dose Route Frequency Provider Last Rate Last Dose  . cyanocobalamin ((VITAMIN B-12)) injection 1,000 mcg  1,000 mcg Intramuscular Q30 days Chipper Herb, MD   1,000 mcg at 05/02/18 1111    Allergies:   Actonel [risedronate]; Lipitor [atorvastatin calcium]; Lyrica [pregabalin]; Sibutramine hcl monohydrate; Alendronate sodium; Latex; Levofloxacin; and Meloxicam    ROS:  Please see the history of present illness.   Otherwise, review of systems are positive for none.   All other systems are reviewed and negative.    PHYSICAL EXAM: VS:  BP 120/62   Pulse 75   Ht 5\' 5"  (1.651 m)   Wt 162 lb (73.5 kg)   BMI 26.96 kg/m  , BMI Body mass index is 26.96 kg/m.  GENERAL:  Well appearing NECK:  No jugular venous distention, waveform within normal limits, carotid upstroke brisk and symmetric, no bruits, no thyromegaly, tracheostomy in place LUNGS:  Clear to auscultation bilaterally CHEST:  Unremarkable HEART:  PMI not displaced or sustained,S1 and S2 within normal limits, no S3, no S4, no clicks, no rubs, 2 out of 6 apical systolic murmur early peaking and nonradiating, 2 out of 6 diastolic murmur heard at the left fourth intercostal space murmurs ABD:  Flat, positive bowel sounds normal in frequency in pitch, no bruits, no rebound, no guarding, no midline pulsatile mass, no hepatomegaly, no splenomegaly EXT:  2 plus pulses throughout, no edema, no cyanosis no clubbing   EKG:  EKG is ordered today. Sinus rhythm, rate 75, right bundle branch block, left anterior fascicular block, no acute ST-T wave changes.  Recent Labs: 09/18/2017: TSH 6.660 12/28/2017: ALT 16 01/01/2018: Magnesium  1.8 04/13/2018: BUN 9; Creatinine, Ser 0.79; Hemoglobin 12.4; Platelets 213; Potassium 3.8; Sodium 141    Lipid Panel    Component Value Date/Time  CHOL 165 03/15/2017 0920   CHOL 166 10/25/2012 0826   TRIG 530 (H) 07/23/2017 1358   TRIG 92 05/03/2016 1437   TRIG 82 10/25/2012 0826   HDL 57 03/15/2017 0920   HDL 58 05/03/2016 1437   HDL 69 10/25/2012 0826   CHOLHDL 2.9 03/15/2017 0920   LDLCALC 89 03/15/2017 0920   LDLCALC 91 03/14/2014 1139   LDLCALC 81 10/25/2012 0826      Wt Readings from Last 3 Encounters:  07/25/18 162 lb (73.5 kg)  05/29/18 155 lb (70.3 kg)  05/23/18 155 lb (70.3 kg)      Other studies Reviewed: Additional studies/ records that were reviewed today include: ENT records Review of the above records demonstrates:     ASSESSMENT AND PLAN   AVR/AORTIC ROOT REPLACEMENT:      She is due to have another echocardiogram in May and I will arrange this.  Otherwise no change in therapy or further imaging is indicated.  RV FAILURE:   This looked normal on follow up echo.  No change in therapy.   HTN:  The blood pressure is well controlled.  She will continue the meds as listed.     Current medicines are reviewed at length with the patient today.  The patient does not have concerns regarding medicines.  The following changes have been made:  None  Labs/ tests ordered today include:   Orders Placed This Encounter  Procedures  . EKG 12-Lead  . ECHOCARDIOGRAM COMPLETE     Disposition:   FU with me in 6 months.     Signed, Minus Breeding, MD  07/25/2018 1:49 PM   [facility]

## 2018-07-25 ENCOUNTER — Encounter: Payer: Self-pay | Admitting: Cardiology

## 2018-07-25 ENCOUNTER — Ambulatory Visit: Payer: Medicare Other | Admitting: Cardiology

## 2018-07-25 VITALS — BP 120/62 | HR 75 | Ht 65.0 in | Wt 162.0 lb

## 2018-07-25 DIAGNOSIS — Z952 Presence of prosthetic heart valve: Secondary | ICD-10-CM

## 2018-07-25 DIAGNOSIS — I10 Essential (primary) hypertension: Secondary | ICD-10-CM

## 2018-07-25 NOTE — Patient Instructions (Signed)
Medication Instructions:  The current medical regimen is effective;  continue present plan and medications.  If you need a refill on your cardiac medications before your next appointment, please call your pharmacy.   Testing/Procedures: Your physician has requested that you have an echocardiogram in 4 months. Echocardiography is a painless test that uses sound waves to create images of your heart. It provides your doctor with information about the size and shape of your heart and how well your heart's chambers and valves are working. This procedure takes approximately one hour. There are no restrictions for this procedure.  Follow-Up: Follow up in 6 months with Dr. Percival Spanish.  You will receive a letter in the mail 2 months before you are due.  Please call us when you receive this letter to schedule your follow up appointment.  Thank you for choosing [facility]!!

## 2018-07-30 ENCOUNTER — Other Ambulatory Visit: Payer: Medicare Other

## 2018-07-30 DIAGNOSIS — I10 Essential (primary) hypertension: Secondary | ICD-10-CM

## 2018-07-30 DIAGNOSIS — I712 Thoracic aortic aneurysm, without rupture: Secondary | ICD-10-CM

## 2018-07-30 DIAGNOSIS — I7121 Aneurysm of the ascending aorta, without rupture: Secondary | ICD-10-CM

## 2018-07-30 DIAGNOSIS — C73 Malignant neoplasm of thyroid gland: Secondary | ICD-10-CM | POA: Diagnosis not present

## 2018-07-30 DIAGNOSIS — E559 Vitamin D deficiency, unspecified: Secondary | ICD-10-CM

## 2018-07-30 DIAGNOSIS — E7800 Pure hypercholesterolemia, unspecified: Secondary | ICD-10-CM | POA: Diagnosis not present

## 2018-07-31 LAB — CBC WITH DIFFERENTIAL/PLATELET
BASOS ABS: 0 10*3/uL (ref 0.0–0.2)
Basos: 1 %
EOS (ABSOLUTE): 0.2 10*3/uL (ref 0.0–0.4)
Eos: 4 %
Hematocrit: 36.9 % (ref 34.0–46.6)
Hemoglobin: 12 g/dL (ref 11.1–15.9)
Immature Grans (Abs): 0 10*3/uL (ref 0.0–0.1)
Immature Granulocytes: 0 %
LYMPHS ABS: 1.8 10*3/uL (ref 0.7–3.1)
Lymphs: 37 %
MCH: 28.8 pg (ref 26.6–33.0)
MCHC: 32.5 g/dL (ref 31.5–35.7)
MCV: 89 fL (ref 79–97)
MONOS ABS: 0.3 10*3/uL (ref 0.1–0.9)
Monocytes: 7 %
Neutrophils Absolute: 2.5 10*3/uL (ref 1.4–7.0)
Neutrophils: 51 %
Platelets: 236 10*3/uL (ref 150–450)
RBC: 4.17 x10E6/uL (ref 3.77–5.28)
RDW: 13 % (ref 11.7–15.4)
WBC: 4.8 10*3/uL (ref 3.4–10.8)

## 2018-07-31 LAB — BMP8+EGFR
BUN/Creatinine Ratio: 12 (ref 12–28)
BUN: 11 mg/dL (ref 8–27)
CHLORIDE: 103 mmol/L (ref 96–106)
CO2: 23 mmol/L (ref 20–29)
Calcium: 8.5 mg/dL — ABNORMAL LOW (ref 8.7–10.3)
Creatinine, Ser: 0.92 mg/dL (ref 0.57–1.00)
GFR calc Af Amer: 70 mL/min/{1.73_m2} (ref >59)
GFR calc non Af Amer: 61 mL/min/{1.73_m2} (ref >59)
GLUCOSE: 101 mg/dL — AB (ref 65–99)
POTASSIUM: 4.5 mmol/L (ref 3.5–5.2)
SODIUM: 142 mmol/L (ref 134–144)

## 2018-07-31 LAB — VITAMIN D 25 HYDROXY (VIT D DEFICIENCY, FRACTURES): VIT D 25 HYDROXY: 33.2 ng/mL (ref 30.0–100.0)

## 2018-07-31 LAB — LIPID PANEL
CHOL/HDL RATIO: 4 ratio (ref 0.0–4.4)
CHOLESTEROL TOTAL: 251 mg/dL — AB (ref 100–199)
HDL: 62 mg/dL (ref >39)
LDL Calculated: 165 mg/dL — ABNORMAL HIGH (ref 0–99)
TRIGLYCERIDES: 118 mg/dL (ref 0–149)
VLDL Cholesterol Cal: 24 mg/dL (ref 5–40)

## 2018-07-31 LAB — HEPATIC FUNCTION PANEL
ALBUMIN: 4.3 g/dL (ref 3.7–4.7)
ALK PHOS: 104 IU/L (ref 39–117)
ALT: 16 IU/L (ref 0–32)
AST: 27 IU/L (ref 0–40)
BILIRUBIN TOTAL: 0.3 mg/dL (ref 0.0–1.2)
BILIRUBIN, DIRECT: 0.07 mg/dL (ref 0.00–0.40)
TOTAL PROTEIN: 6.5 g/dL (ref 6.0–8.5)

## 2018-07-31 LAB — THYROID PANEL WITH TSH
Free Thyroxine Index: 2.1 (ref 1.2–4.9)
T3 Uptake Ratio: 25 % (ref 24–39)
T4 TOTAL: 8.4 ug/dL (ref 4.5–12.0)
TSH: 1.73 u[IU]/mL (ref 0.450–4.500)

## 2018-08-02 ENCOUNTER — Encounter: Payer: Self-pay | Admitting: Family Medicine

## 2018-08-02 ENCOUNTER — Ambulatory Visit (INDEPENDENT_AMBULATORY_CARE_PROVIDER_SITE_OTHER): Payer: Medicare Other | Admitting: Family Medicine

## 2018-08-02 VITALS — BP 134/70 | HR 80 | Temp 96.0°F | Ht 65.0 in | Wt 160.0 lb

## 2018-08-02 DIAGNOSIS — C73 Malignant neoplasm of thyroid gland: Secondary | ICD-10-CM | POA: Diagnosis not present

## 2018-08-02 DIAGNOSIS — Z13820 Encounter for screening for osteoporosis: Secondary | ICD-10-CM

## 2018-08-02 DIAGNOSIS — I10 Essential (primary) hypertension: Secondary | ICD-10-CM | POA: Diagnosis not present

## 2018-08-02 DIAGNOSIS — E538 Deficiency of other specified B group vitamins: Secondary | ICD-10-CM

## 2018-08-02 DIAGNOSIS — Z780 Asymptomatic menopausal state: Secondary | ICD-10-CM

## 2018-08-02 DIAGNOSIS — I7121 Aneurysm of the ascending aorta, without rupture: Secondary | ICD-10-CM

## 2018-08-02 DIAGNOSIS — I712 Thoracic aortic aneurysm, without rupture: Secondary | ICD-10-CM

## 2018-08-02 MED ORDER — CLONAZEPAM 0.25 MG PO TBDP: ORAL_TABLET | ORAL | 5 refills | Status: DC

## 2018-08-02 MED ORDER — SIMVASTATIN 80 MG PO TABS: 80.0000 mg | ORAL_TABLET | Freq: Every day | ORAL | 3 refills | Status: DC

## 2018-08-02 NOTE — Patient Instructions (Addendum)
Medicare Annual Wellness Visit  [facility] and the medical providers at [facility] strive to bring you the best medical care.  In doing so we not only want to address your current medical conditions and concerns but also to detect new conditions early and prevent illness, disease and health-related problems.    Medicare offers a yearly Wellness Visit which allows our clinical staff to assess your need for preventative services including immunizations, lifestyle education, counseling to decrease risk of preventable diseases and screening for fall risk and other medical concerns.    This visit is provided free of charge (no copay) for all Medicare recipients. The clinical pharmacists at [facility] have begun to conduct these Wellness Visits which will also include a thorough review of all your medications.    As you primary medical provider recommend that you make an appointment for your Annual Wellness Visit if you have not done so already this year.  You may set up this appointment before you leave today or you may call back ([phone]) and schedule an appointment.  Please make sure when you call that you mention that you are scheduling your Annual Wellness Visit with the clinical pharmacist so that the appointment may be made for the proper length of time.    Continue current medications. Continue good therapeutic lifestyle changes which include good diet and exercise. Fall precautions discussed with patient. If an FOBT was given today- please return it to our front desk. If you are over 57 years old - you may need Prevnar 82 or the adult Pneumonia vaccine.  **Flu shots are available--- please call and schedule a FLU-CLINIC appointment**  After your visit with [place] today you will receive a survey in the mail or online from [employer] regarding your care with [place]. Please take a moment to fill this out. Your feedback is very  important to [place] as you can help [place] better understand your patient needs as well as improve your experience and satisfaction. WE CARE ABOUT YOU!!!  Restart vitamin D3 2000 units daily Restart previous dose of simvastatin and take 1 nightly at bedtime Drink more water.  Drink more water Good hand hygiene and respiratory hygiene are important during the flu season.  Use hand sanitizer's regularly.  Stay away from sick people. Follow-up with ear nose and throat specialist as currently doing

## 2018-08-02 NOTE — Progress Notes (Signed)
Subjective:    Patient ID: [name], female    DOB: September 25, 1940, 78 y.o.   MRN: [id number]  HPI Pt here for follow up and management of chronic medical problems which includes hypertension. She is taking medication regularly.  The patient is doing well and improving slowly.  She is still working on getting her vocal cord issues tweaked so that she can remove the trach.  She is doing this with the help of her specialist in [place] and on a referral to [place].  She will get her B12 shot today.  She has had lab work done and we will review that with her during the visit today.  She will also get her DEXA scan today.  The husband comes with her to the visit today.  Her total cholesterol was elevated.  Her triglycerides were good and the good cholesterol was good but her LDL cholesterol was very high this time and normally is much lower.  We will consider trying some low-dose statin and see if she can tolerate this.  The blood sugar was slightly elevated at 101.  The creatinine and all of the electrolytes were good and the serum calcium was slightly decreased.  The potassium was within normal limits.  The CBC was entirely within normal limits with a normal white blood cell count stable hemoglobin and adequate platelet count.  The vitamin D level was at the low end of the normal range and we need to make sure she is taking her vitamin D regularly.  All liver function tests were normal all thyroid function tests were normal.  Patient is pleasant and upbeat and doing well considering all she has been through and she is thankful for where she is now and all of this started a little over a year ago with her major surgery.  Today she denies any chest pain pressure tightness or shortness of breath.  She denies any trouble with swallowing heartburn indigestion nausea vomiting diarrhea blood in the stool or black tarry bowel movements or change in bowel habits.  She is passing her water well.  She does need  to drink more water.  After reviewing her lab work she will restart her Crestor that she was on prior to all the surgery and her vitamin D of 2000 units daily.    Patient Active Problem List   Diagnosis Date Noted  . Laryngeal stenosis 04/11/2018  . Senile purpura ([place]) 02/02/2018  . Leg swelling 01/17/2018  . Hypokalemia 12/27/2017  . Hypomagnesemia 12/27/2017  . Hypocalcemia 12/27/2017  . Sensory disturbance 12/27/2017  . Tracheostomy status ([place])   . Airway obstruction   . Dyspnea on exertion   . Stridor   . Acute diastolic congestive heart failure ([place])   . Dysphagia   . Anxiety and depression   . Weakness 08/12/2017  . PAF (paroxysmal atrial fibrillation) ([place])   . Pressure injury of skin 08/04/2017  . Tracheostomy dependence ([place])   . Acute encephalopathy   . HCAP (healthcare-associated pneumonia)   . Acute respiratory failure ([place])   . RVF (right ventricular failure) ([place])   . RVAD (right ventricular assist device) present ([place])   . Cardiogenic shock ([place])   . S/P AVR 07/10/2017  . Nephrolithiasis 04/07/2017  . Vitamin B 12 deficiency 03/01/2016  . Ascending aortic aneurysm ([place]) 08/20/2015  . Thyroid cancer ([place]) 07/30/2015  . Left thyroid nodule 07/27/2015  . Osteoporosis with fracture 04/16/2014  . At high risk for falls 04/16/2014  .  Proximal humerus fracture 05/30/2011  . CHEST PAIN, PRECORDIAL 02/26/2010  . Hyperlipidemia 02/23/2010  . Depression 02/23/2010  . GLAUCOMA 02/23/2010  . CATARACTS 02/23/2010  . RHEUMATIC FEVER 02/23/2010  . Essential hypertension 02/23/2010  . MITRAL VALVE PROLAPSE 02/23/2010  . GERD 02/23/2010  . Osteoarthritis 02/23/2010  . Primary fibromyalgia syndrome 02/23/2010   Outpatient Encounter Medications as of 08/02/2018  Medication Sig  . aspirin 81 MG chewable tablet Chew 81 mg by mouth at bedtime.   . calcium carbonate (OS-CAL - DOSED IN MG OF ELEMENTAL CALCIUM) 1250 (500 Ca) MG tablet Take 1 tablet (500 mg of elemental  calcium total) by mouth daily with breakfast.  . clonazePAM (KLONOPIN) 0.25 MG disintegrating tablet DISSOLVE 1 TABLET BY MOUTH 3 TIMES A DAY  . furosemide (LASIX) 20 MG tablet Take 20 mg by mouth daily.   Marland Kitchen guaiFENesin (ROBITUSSIN) 100 MG/5ML SOLN Take 5 mLs (100 mg total) by mouth 3 (three) times daily with meals.  . latanoprost (XALATAN) 0.005 % ophthalmic solution Place 1 drop into both eyes at bedtime.  Marland Kitchen levothyroxine (SYNTHROID, LEVOTHROID) 100 MCG tablet Take 100 mcg by mouth See admin instructions. Take one tablet by mouth every other day (alternates doses)  . levothyroxine (SYNTHROID, LEVOTHROID) 112 MCG tablet Take 112 mcg by mouth every other day. Alternates doses  . loratadine (CLARITIN) 10 MG tablet Take 1 tablet (10 mg total) by mouth daily. (Patient taking differently: Take 10 mg by mouth every evening. )  . magnesium oxide (MAG-OX) 400 (241.3 Mg) MG tablet Take 1 tablet (400 mg total) by mouth daily. (Patient taking differently: Take 400 mg by mouth 2 (two) times daily. )  . Multiple Vitamin (MULTIVITAMIN WITH MINERALS) TABS tablet Take 1 tablet by mouth daily.  . potassium chloride (K-DUR) 10 MEQ tablet Take 2 tablets (20 mEq total) by mouth 2 (two) times daily.  Marland Kitchen PROCTOZONE-HC 2.5 % rectal cream APPLY RECTALLY 2 TIMES A DAY AS NEEDED  . QUEtiapine (SEROQUEL) 25 MG tablet TAKE 3 TABLETS AT BEDTIME AS DIRECTED  . venlafaxine (EFFEXOR) 37.5 MG tablet Take 1 tablet (37.5 mg total) by mouth 2 (two) times daily.   Facility-Administered Encounter Medications as of 08/02/2018  Medication  . cyanocobalamin ((VITAMIN B-12)) injection 1,000 mcg     Review of Systems  Constitutional: Negative.   HENT: Negative.   Eyes: Negative.   Respiratory: Negative.   Cardiovascular: Negative.   Gastrointestinal: Negative.   Endocrine: Negative.   Genitourinary: Negative.   Musculoskeletal: Negative.   Skin: Negative.   Allergic/Immunologic: Negative.   Neurological: Negative.     Hematological: Negative.   Psychiatric/Behavioral: Negative.        Objective:   Physical Exam Vitals signs and nursing note reviewed.  Constitutional:      General: She is not in acute distress.    Appearance: Normal appearance. She is well-developed and normal weight. She is not ill-appearing.     Comments: The patient is doing great she is looking more more like her original self if she can get the vocal cord issues straightened out she will be even better.  She has been patient in the process.  HENT:     Head: Normocephalic and atraumatic.     Right Ear: Tympanic membrane, ear canal and external ear normal. There is no impacted cerumen.     Left Ear: Tympanic membrane, ear canal and external ear normal. There is no impacted cerumen.     Nose: Nose normal. No congestion or rhinorrhea.  Mouth/Throat:     Mouth: Mucous membranes are moist.     Pharynx: Oropharynx is clear. No oropharyngeal exudate or posterior oropharyngeal erythema.  Eyes:     General: No scleral icterus.       Right eye: No discharge.        Left eye: No discharge.     Extraocular Movements: Extraocular movements intact.     Conjunctiva/sclera: Conjunctivae normal.     Pupils: Pupils are equal, round, and reactive to light.  Neck:     Musculoskeletal: Normal range of motion and neck supple.     Thyroid: No thyromegaly.     Vascular: No JVD.     Comments: Tracheostomy still in place. Cardiovascular:     Rate and Rhythm: Normal rate and regular rhythm.     Pulses: Normal pulses.     Heart sounds: Murmur present.     Comments: The heart is regular at 84/min no pedal edema with good pedal pulses.  There is great 2/6 systolic ejection murmur Pulmonary:     Effort: Pulmonary effort is normal.     Breath sounds: Normal breath sounds. No wheezing or rales.  Abdominal:     General: Bowel sounds are normal.     Palpations: Abdomen is soft. There is no mass.     Tenderness: There is no abdominal tenderness.   Musculoskeletal: Normal range of motion.        General: No tenderness.     Right lower leg: No edema.     Left lower leg: No edema.     Comments: Patient still has some gait instability and is using a cane for ambulation.  Skin:    General: Skin is warm and dry.     Findings: No rash.  Neurological:     General: No focal deficit present.     Mental Status: She is alert and oriented to person, place, and time. Mental status is at baseline.     Cranial Nerves: No cranial nerve deficit.     Gait: Gait normal.     Deep Tendon Reflexes: Reflexes are normal and symmetric. Reflexes normal.  Psychiatric:        Mood and Affect: Mood normal.        Behavior: Behavior normal.        Thought Content: Thought content normal.        Judgment: Judgment normal.     Comments: Patient's mood affect and behavior are all normal for her and she is improving and is more positive.    BP 134/70 (BP Location: Left Arm)   Pulse 80   Temp (!) 96 F (35.6 C) (Oral)   Ht 5\' 5"  (1.651 m)   Wt 160 lb (72.6 kg)   BMI 26.63 kg/m         Assessment & Plan:  1. Essential hypertension -The blood pressure is good and she will continue with current treatment  2. B12 deficiency -Continue with B12 injections  3. Thyroid cancer ([place]) -Continue with thyroid replacement  4. Ascending aortic aneurysm ([facility]) -Follow-up with Dr. Darcey Nora as planned  5. Vitamin B 12 deficiency -B12 injection today  6. Screening for osteoporosis - DG [facility] DEXA; Future  7. Postmenopausal - DG [facility] DEXA; Future  Meds ordered this encounter  Medications  . clonazePAM (KLONOPIN) 0.25 MG disintegrating tablet    Sig: DISSOLVE 1 TABLET BY MOUTH 3 TIMES A DAY    Dispense:  90 tablet    Refill:  5  .  simvastatin (ZOCOR) 80 MG tablet    Sig: Take 1 tablet (80 mg total) by mouth daily.    Dispense:  90 tablet    Refill:  3   Patient Instructions                       Medicare Annual Wellness Visit  [facility] and  the medical providers at [facility] strive to bring you the best medical care.  In doing so we not only want to address your current medical conditions and concerns but also to detect new conditions early and prevent illness, disease and health-related problems.    Medicare offers a yearly Wellness Visit which allows our clinical staff to assess your need for preventative services including immunizations, lifestyle education, counseling to decrease risk of preventable diseases and screening for fall risk and other medical concerns.    This visit is provided free of charge (no copay) for all Medicare recipients. The clinical pharmacists at [facility] have begun to conduct these Wellness Visits which will also include a thorough review of all your medications.    As you primary medical provider recommend that you make an appointment for your Annual Wellness Visit if you have not done so already this year.  You may set up this appointment before you leave today or you may call back ([phone]) and schedule an appointment.  Please make sure when you call that you mention that you are scheduling your Annual Wellness Visit with the clinical pharmacist so that the appointment may be made for the proper length of time.    Continue current medications. Continue good therapeutic lifestyle changes which include good diet and exercise. Fall precautions discussed with patient. If an FOBT was given today- please return it to our front desk. If you are over 10 years old - you may need Prevnar 74 or the adult Pneumonia vaccine.  **Flu shots are available--- please call and schedule a FLU-CLINIC appointment**  After your visit with [place] today you will receive a survey in the mail or online from [employer] regarding your care with [place]. Please take a moment to fill this out. Your feedback is very important to [place] as you can help [place] better understand your patient needs as well  as improve your experience and satisfaction. WE CARE ABOUT YOU!!!  Restart vitamin D3 2000 units daily Restart previous dose of simvastatin and take 1 nightly at bedtime Drink more water.  Drink more water Good hand hygiene and respiratory hygiene are important during the flu season.  Use hand sanitizer's regularly.  Stay away from sick people. Follow-up with ear nose and throat specialist as currently doing   Arrie Senate MD

## 2018-08-07 DIAGNOSIS — Z930 Tracheostomy status: Secondary | ICD-10-CM | POA: Diagnosis not present

## 2018-08-07 DIAGNOSIS — J3802 Paralysis of vocal cords and larynx, bilateral: Secondary | ICD-10-CM | POA: Diagnosis not present

## 2018-08-07 DIAGNOSIS — J386 Stenosis of larynx: Secondary | ICD-10-CM | POA: Diagnosis not present

## 2018-08-07 DIAGNOSIS — L929 Granulomatous disorder of the skin and subcutaneous tissue, unspecified: Secondary | ICD-10-CM | POA: Diagnosis not present

## 2018-08-07 DIAGNOSIS — R490 Dysphonia: Secondary | ICD-10-CM | POA: Diagnosis not present

## 2018-08-24 ENCOUNTER — Telehealth: Payer: Self-pay | Admitting: Cardiothoracic Surgery

## 2018-08-24 ENCOUNTER — Other Ambulatory Visit: Payer: Self-pay | Admitting: Cardiothoracic Surgery

## 2018-08-24 DIAGNOSIS — Z952 Presence of prosthetic heart valve: Secondary | ICD-10-CM

## 2018-08-24 NOTE — Telephone Encounter (Signed)
Pre-cert Verification for the following procedure    Echo scheduled for 08/31/2018 at [facility]

## 2018-08-31 ENCOUNTER — Ambulatory Visit (HOSPITAL_COMMUNITY)
Admission: RE | Admit: 2018-08-31 | Discharge: 2018-08-31 | Disposition: A | Discharge status: Home / Self Care | Payer: Medicare Other | Source: Ambulatory Visit | Attending: Cardiothoracic Surgery | Admitting: Cardiothoracic Surgery

## 2018-08-31 DIAGNOSIS — Z952 Presence of prosthetic heart valve: Secondary | ICD-10-CM | POA: Diagnosis not present

## 2018-08-31 NOTE — Progress Notes (Signed)
*  PRELIMINARY RESULTS* Echocardiogram 2D Echocardiogram has been performed.  [name] 08/31/2018, 3:18 PM

## 2018-09-13 DIAGNOSIS — H10013 Acute follicular conjunctivitis, bilateral: Secondary | ICD-10-CM | POA: Diagnosis not present

## 2018-10-10 ENCOUNTER — Telehealth: Payer: Medicare Other | Admitting: Cardiothoracic Surgery

## 2018-10-10 ENCOUNTER — Ambulatory Visit (INDEPENDENT_AMBULATORY_CARE_PROVIDER_SITE_OTHER): Payer: Medicare Other | Admitting: Cardiothoracic Surgery

## 2018-10-10 ENCOUNTER — Other Ambulatory Visit: Payer: Self-pay

## 2018-10-10 ENCOUNTER — Encounter: Payer: Self-pay | Admitting: Cardiothoracic Surgery

## 2018-10-10 VITALS — BP 120/80 | HR 90 | Temp 96.7°F | Resp 20 | Ht 65.0 in | Wt 160.0 lb

## 2018-10-10 DIAGNOSIS — I7121 Aneurysm of the ascending aorta, without rupture: Secondary | ICD-10-CM

## 2018-10-10 DIAGNOSIS — Z9889 Other specified postprocedural states: Secondary | ICD-10-CM

## 2018-10-10 DIAGNOSIS — Z952 Presence of prosthetic heart valve: Secondary | ICD-10-CM | POA: Diagnosis not present

## 2018-10-10 DIAGNOSIS — I712 Thoracic aortic aneurysm, without rupture: Secondary | ICD-10-CM | POA: Diagnosis not present

## 2018-10-10 DIAGNOSIS — Z09 Encounter for follow-up examination after completed treatment for conditions other than malignant neoplasm: Secondary | ICD-10-CM

## 2018-10-10 NOTE — Progress Notes (Addendum)
PCP is Chipper Herb, MD Referring Provider is Chipper Herb, MD  Chief Complaint  Patient presents with  . Thoracic Aortic Aneurysm    6 month f/u review ECHO 08/31/18    HPI: Patient returns for one-year follow-up with echocardiogram.  January 2019 she underwent aortic root replacement for 5 cm ascending aneurysm and severe aortic valve disease.  She had a 30 mm Hemashield graft with a 21 mm bovine pericardial magna ease valve.  She had RV dysfunction postop and required placement of a temporary percutaneousRVAD.  She needed tracheostomy but subsequently recovered.  She had tracheal stenosis from granulation tissue in her small trachea and was unable to be decannulated.  She was treated in [facility] by Dr. Redmond Baseman who is referred the patient for treatment of her tracheal stenosis to [facility].  She has a #4 trach with a Passy-Muir valve during the day.  She is able to walk around the house and yard.  Patient denies any symptoms of angina or heart failure.  She is in sinus rhythm.  Echocardiogram shows normal biventricular function.  Normal functioning of her bioprosthetic AVR.  No significant MR or TR.  Aortic root repair intact.   Past Medical History:  Diagnosis Date  . ACL tear    right  Dr. Gladstone Pih   . Adenomatous polyp   . Anxiety   . Arthritis    "all over" (04/12/2018)  . Ascending aortic aneurysm ([place])    note per chart per Dr Lucianne Lei Tright 4.7 cm 04/15/2015   . Asthma   . B12 deficiency   . Chronic bronchitis ([place])   . Depression   . DUB (dysfunctional uterine bleeding) 10/96  . Fibromyalgia   . GERD (gastroesophageal reflux disease)   . Glaucoma    bilaterally  . Helicobacter pylori (H. pylori)   . Hiatal hernia   . History of blood transfusion 06/2017   "related to OHS"  . History of kidney stones   . History of left shoulder fracture    pt states fell off bed and broke ball in shoulder had rod placed   . Hyperlipidemia   . Hypertension   .  Hypothyroidism   . Inspiratory stridor   . Laryngeal stenosis   . Occasional tremors    left arm   . Osteoarthritis   . Osteopenia   . Osteoporosis   . Thyroid cancer ([place]) 2017  . Thyroid nodule   . Tracheostomy in place [facility])     Past Surgical History:  Procedure Laterality Date  . AORTIC VALVE REPLACEMENT N/A 07/10/2017   Procedure: AORTIC VALVE REPLACEMENT (AVR);  Surgeon: [name], Collier Salina, MD;  Location: [place];  Service: Open Heart Surgery;  Laterality: N/A;  . APPENDECTOMY    . BUNIONECTOMY  08/1999   right - Dr. Irving Shows   . CARDIAC VALVE REPLACEMENT    . CATARACT EXTRACTION, BILATERAL Bilateral 05/28/07-2009   "right-left"  . CHOLECYSTECTOMY OPEN  1979  . DILATION AND CURETTAGE OF UTERUS  03/31/95   Dr. Ovid Curd   . DIRECT LARYNGOSCOPY WITH BOTOX INJECTION N/A 12/01/2017   Procedure: SUSPENDED DIRECT MICROLARYNGOSCOPY WITH KENALOG INJECTION, CO2 LASER;  Surgeon: Melida Quitter, MD;  Location: [facility];  Service: ENT;  Laterality: N/A;  . EXCISIONAL HEMORRHOIDECTOMY    . EXPLORATION POST OPERATIVE OPEN HEART N/A 07/11/2017   Procedure: EXPLORATION POST OPERATIVE OPEN HEART;  Surgeon: Ivin Poot, MD;  Location: [place];  Service: Open Heart Surgery;  Laterality: N/A;  .  EYE SURGERY     also had left cataract removed   . FRACTURE SURGERY    . HEMATOMA EVACUATION N/A 07/11/2017   Procedure: EVACUATION HEMATOMA;  Surgeon: Ivin Poot, MD;  Location: [place];  Service: Open Heart Surgery;  Laterality: N/A;  . HERNIA REPAIR    . JOINT REPLACEMENT    . MICROLARYNGOSCOPY WITH LASER AND BALLOON DILATION N/A 01/24/2018   Procedure: MICROLARYNGOSCOPY WITH LASER AND BALLOON DILATION;  Surgeon: Melida Quitter, MD;  Location: [facility];  Service: ENT;  Laterality: N/A;  . MICROLARYNGOSCOPY WITH LASER AND BALLOON DILATION N/A 04/13/2018   Procedure: MICROLARYNGOSCOPY WITH BALLOON DILATION;  Surgeon: Melida Quitter, MD;  [facility];  Service: ENT;  Laterality: N/A;  . NEPHROLITHOTOMY Left  04/07/2017   Procedure: NEPHROLITHOTOMY PERCUTANEOUS WITH SURGEON ACCESS;  Surgeon: Ardis Hughs, MD;  [facility];  Service: Urology;  Laterality: Left;  . ORIF HUMERUS FRACTURE  05/30/2011   Procedure: OPEN REDUCTION INTERNAL FIXATION (ORIF) PROXIMAL HUMERUS FRACTURE;  Surgeon: Augustin Schooling;  Location: [facility];  Service: Orthopedics;  Laterality: Left;  open reduction internal fixation of proximal humerus fracture  . PLACEMENT OF CENTRIMAG VENTRICULAR ASSIST DEVICE Right 07/11/2017   Procedure: PLACEMENT OF CENTRIMAG VENTRICULAR ASSIST DEVICE;  Surgeon: Ivin Poot, MD;  Location: [place];  Service: Open Heart Surgery;  Laterality: Right;  . REMOVAL OF CENTRIMAG VENTRICULAR ASSIST DEVICE N/A 07/17/2017   Procedure: REMOVAL OF RVAD WITH PUMP STANDBY;  Surgeon: Ivin Poot, MD;  Location: [place];  Service: Open Heart Surgery;  Laterality: N/A;  . REPLACEMENT ASCENDING AORTA N/A 07/10/2017   Procedure: REPLACEMENT ASCENDING AORTA;  Surgeon: Ivin Poot, MD;  Location: [place];  Service: Open Heart Surgery;  Laterality: N/A;  . RIGHT/LEFT HEART CATH AND CORONARY ANGIOGRAPHY N/A 06/13/2017   Procedure: RIGHT/LEFT HEART CATH AND CORONARY ANGIOGRAPHY;  Surgeon: Jolaine Artist, MD;  Location: [facility];  Service: Cardiovascular;  Laterality: N/A;  . TEE WITHOUT CARDIOVERSION N/A 07/10/2017   Procedure: TRANSESOPHAGEAL ECHOCARDIOGRAM (TEE);  Surgeon: [name], Collier Salina, MD;  Location: [place];  Service: Open Heart Surgery;  Laterality: N/A;  . TEE WITHOUT CARDIOVERSION  07/11/2017   Procedure: TRANSESOPHAGEAL ECHOCARDIOGRAM (TEE);  Surgeon: [name], Collier Salina, MD;  Location: [place];  Service: Open Heart Surgery;;  . TEE WITHOUT CARDIOVERSION N/A 07/17/2017   Procedure: TRANSESOPHAGEAL ECHOCARDIOGRAM (TEE);  Surgeon: [name], Collier Salina, MD;  Location: [place];  Service: Open Heart Surgery;  Laterality: N/A;  . THYROID LOBECTOMY Left 07/27/2015   Procedure: LEFT THYROID LOBECTOMY;   Surgeon: Fanny Skates, MD;  [facility];  Service: General;  Laterality: Left;  . THYROIDECTOMY N/A 08/06/2015   Procedure: RIGHT THYROID LOBECTOMY, REIMPLANTATION PARATHYROID;  Surgeon: Fanny Skates, MD;  [facility];  Service: General;  Laterality: N/A;  . TOTAL KNEE ARTHROPLASTY Right 08/2009   Dr. Gladstone Pih  . TRACHEOSTOMY  08/08/2017  . VENTRAL HERNIA REPAIR  2/99    Family History  Problem Relation Age of Onset  . Cancer Mother        PANCREATIC   . Osteoporosis Mother   . Hip fracture Mother   . Heart attack Father 63       Died 68  . Heart disease Father   . Arthritis Father   . Hypertension Sister   . Cancer Sister        skin  . Hypertension Sister   . Cancer Sister  breast cancer  . Breast cancer Neg Hx     Social History Social History   Tobacco Use  . Smoking status: Never Smoker  . Smokeless tobacco: Never Used  Substance Use Topics  . Alcohol use: No  . Drug use: Never    Current Outpatient Medications  Medication Sig Dispense Refill  . aspirin 81 MG chewable tablet Chew 81 mg by mouth at bedtime.     . calcium carbonate (OS-CAL - DOSED IN MG OF ELEMENTAL CALCIUM) 1250 (500 Ca) MG tablet Take 1 tablet (500 mg of elemental calcium total) by mouth daily with breakfast. 30 tablet 0  . clonazePAM (KLONOPIN) 0.25 MG disintegrating tablet DISSOLVE 1 TABLET BY MOUTH 3 TIMES A DAY 90 tablet 5  . furosemide (LASIX) 20 MG tablet Take 10 mg by mouth daily. 10 mg po  Mon/Wed/Fridays    . guaiFENesin (ROBITUSSIN) 100 MG/5ML SOLN Take 5 mLs (100 mg total) by mouth 3 (three) times daily with meals. 1200 mL 0  . latanoprost (XALATAN) 0.005 % ophthalmic solution Place 1 drop into both eyes at bedtime. 2.5 mL 12  . levothyroxine (SYNTHROID, LEVOTHROID) 100 MCG tablet Take 100 mcg by mouth See admin instructions. Take one tablet by mouth every other day (alternates doses)    . levothyroxine (SYNTHROID, LEVOTHROID) 112 MCG tablet Take 112 mcg by mouth every  other day. Alternates doses    . loratadine (CLARITIN) 10 MG tablet Take 1 tablet (10 mg total) by mouth daily. (Patient taking differently: Take 10 mg by mouth every evening. ) 30 tablet 11  . magnesium oxide (MAG-OX) 400 (241.3 Mg) MG tablet Take 1 tablet (400 mg total) by mouth daily. (Patient taking differently: Take 400 mg by mouth 2 (two) times daily. ) 30 tablet 0  . Multiple Vitamin (MULTIVITAMIN WITH MINERALS) TABS tablet Take 1 tablet by mouth daily.    . potassium chloride (K-DUR) 10 MEQ tablet Take 2 tablets (20 mEq total) by mouth 2 (two) times daily. 120 tablet 11  . PROCTOZONE-HC 2.5 % rectal cream APPLY RECTALLY 2 TIMES A DAY AS NEEDED 30 g 2  . QUEtiapine (SEROQUEL) 25 MG tablet TAKE 3 TABLETS AT BEDTIME AS DIRECTED 90 tablet 5  . simvastatin (ZOCOR) 80 MG tablet Take 1 tablet (80 mg total) by mouth daily. 90 tablet 3  . venlafaxine (EFFEXOR) 37.5 MG tablet Take 1 tablet (37.5 mg total) by mouth 2 (two) times daily. 60 tablet 11   Current Facility-Administered Medications  Medication Dose Route Frequency Provider Last Rate Last Dose  . cyanocobalamin ((VITAMIN B-12)) injection 1,000 mcg  1,000 mcg Intramuscular Q30 days Chipper Herb, MD   1,000 mcg at 08/02/18 1106    Allergies  Allergen Reactions  . Actonel [Risedronate] Other (See Comments)    Bone pain and nausea   . Lipitor [Atorvastatin Calcium] Other (See Comments)    myalgias  . Lyrica [Pregabalin] Other (See Comments)    SORES IN MOUTH   . Sibutramine Hcl Monohydrate Other (See Comments)    UNSPECIFIED REACTION   . Alendronate Sodium Nausea Only  . Latex Rash and Other (See Comments)    RA to latex 1982; includes bandaids    . Levofloxacin Nausea And Vomiting  . Meloxicam Other (See Comments)    Vaginal itching     Review of Systems  Weight stable No edema Needs tracheal suctioning once at night during the high pollen count seasons  BP 120/80   Pulse 90   Temp (!)  96.7 F (35.9 C) (Tympanic)    Resp 20   Ht 5\' 5"  (1.651 m)   Wt 160 lb (72.6 kg)   SpO2 95% Comment: RA  BMI 26.63 kg/m  Physical Exam Alert and comfortable Lungs clear #4 trach in place with PM valve Heart rhythm regular No murmur No gallop  Diagnostic Tests: Echocardiogram images personally reviewed and counseled patient.  Results as above.  Impression: Patient doing well from cardiac standpoint. She has an appointment to be evaluated and treated at [facility] for her tracheal stenosis to attempt decannulation. She will return here in 1 year for repeat echocardiogram.  Plan: She will be contacted in early 2021 for her appointment after echocardiogram at  [facility] in [place], MD [facility] [phone]

## 2018-10-11 ENCOUNTER — Ambulatory Visit (INDEPENDENT_AMBULATORY_CARE_PROVIDER_SITE_OTHER): Payer: Medicare Other | Admitting: *Deleted

## 2018-10-11 DIAGNOSIS — E538 Deficiency of other specified B group vitamins: Secondary | ICD-10-CM | POA: Diagnosis not present

## 2018-10-26 DIAGNOSIS — E890 Postprocedural hypothyroidism: Secondary | ICD-10-CM | POA: Diagnosis not present

## 2018-10-26 DIAGNOSIS — C73 Malignant neoplasm of thyroid gland: Secondary | ICD-10-CM | POA: Diagnosis not present

## 2018-10-30 DIAGNOSIS — J386 Stenosis of larynx: Secondary | ICD-10-CM | POA: Diagnosis not present

## 2018-10-30 DIAGNOSIS — Z930 Tracheostomy status: Secondary | ICD-10-CM | POA: Diagnosis not present

## 2018-10-30 DIAGNOSIS — J3802 Paralysis of vocal cords and larynx, bilateral: Secondary | ICD-10-CM | POA: Diagnosis not present

## 2018-11-16 ENCOUNTER — Other Ambulatory Visit: Payer: Self-pay | Admitting: Family Medicine

## 2018-11-20 ENCOUNTER — Other Ambulatory Visit: Payer: Self-pay | Admitting: Family Medicine

## 2018-11-20 DIAGNOSIS — J386 Stenosis of larynx: Secondary | ICD-10-CM | POA: Diagnosis not present

## 2018-11-20 DIAGNOSIS — Z930 Tracheostomy status: Secondary | ICD-10-CM | POA: Diagnosis not present

## 2018-11-20 DIAGNOSIS — J3802 Paralysis of vocal cords and larynx, bilateral: Secondary | ICD-10-CM | POA: Diagnosis not present

## 2018-12-04 ENCOUNTER — Ambulatory Visit (INDEPENDENT_AMBULATORY_CARE_PROVIDER_SITE_OTHER): Payer: Medicare Other | Admitting: *Deleted

## 2018-12-04 ENCOUNTER — Other Ambulatory Visit: Payer: Self-pay

## 2018-12-04 DIAGNOSIS — E538 Deficiency of other specified B group vitamins: Secondary | ICD-10-CM

## 2018-12-04 MED ORDER — CYANOCOBALAMIN 1000 MCG/ML IJ SOLN
1000.0000 ug | INTRAMUSCULAR | Status: AC
  Administered 2018-12-04 – 2019-10-04 (×7): 1000 ug via INTRAMUSCULAR

## 2018-12-04 NOTE — Progress Notes (Signed)
Pt given Cyanocobalamin inj Tolerated well 

## 2018-12-11 DIAGNOSIS — J3802 Paralysis of vocal cords and larynx, bilateral: Secondary | ICD-10-CM | POA: Diagnosis not present

## 2018-12-11 DIAGNOSIS — Z930 Tracheostomy status: Secondary | ICD-10-CM | POA: Diagnosis not present

## 2018-12-11 DIAGNOSIS — J386 Stenosis of larynx: Secondary | ICD-10-CM | POA: Diagnosis not present

## 2018-12-12 DIAGNOSIS — Z1159 Encounter for screening for other viral diseases: Secondary | ICD-10-CM | POA: Diagnosis not present

## 2018-12-12 DIAGNOSIS — J386 Stenosis of larynx: Secondary | ICD-10-CM | POA: Diagnosis not present

## 2018-12-12 DIAGNOSIS — J3802 Paralysis of vocal cords and larynx, bilateral: Secondary | ICD-10-CM | POA: Diagnosis not present

## 2018-12-12 DIAGNOSIS — R490 Dysphonia: Secondary | ICD-10-CM | POA: Diagnosis not present

## 2018-12-12 DIAGNOSIS — Z20828 Contact with and (suspected) exposure to other viral communicable diseases: Secondary | ICD-10-CM | POA: Diagnosis not present

## 2018-12-12 DIAGNOSIS — Z01812 Encounter for preprocedural laboratory examination: Secondary | ICD-10-CM | POA: Diagnosis not present

## 2018-12-12 DIAGNOSIS — Z930 Tracheostomy status: Secondary | ICD-10-CM | POA: Diagnosis not present

## 2018-12-19 DIAGNOSIS — J3802 Paralysis of vocal cords and larynx, bilateral: Secondary | ICD-10-CM | POA: Diagnosis not present

## 2018-12-19 DIAGNOSIS — J386 Stenosis of larynx: Secondary | ICD-10-CM | POA: Diagnosis not present

## 2018-12-19 DIAGNOSIS — Z930 Tracheostomy status: Secondary | ICD-10-CM | POA: Diagnosis not present

## 2018-12-21 ENCOUNTER — Other Ambulatory Visit: Payer: Self-pay

## 2018-12-21 ENCOUNTER — Telehealth: Payer: Self-pay | Admitting: *Deleted

## 2018-12-21 ENCOUNTER — Encounter: Payer: Self-pay | Admitting: Family Medicine

## 2018-12-21 ENCOUNTER — Ambulatory Visit (INDEPENDENT_AMBULATORY_CARE_PROVIDER_SITE_OTHER): Payer: Medicare Other | Admitting: Family Medicine

## 2018-12-21 DIAGNOSIS — F419 Anxiety disorder, unspecified: Secondary | ICD-10-CM

## 2018-12-21 DIAGNOSIS — F418 Other specified anxiety disorders: Secondary | ICD-10-CM

## 2018-12-21 DIAGNOSIS — C73 Malignant neoplasm of thyroid gland: Secondary | ICD-10-CM

## 2018-12-21 DIAGNOSIS — I10 Essential (primary) hypertension: Secondary | ICD-10-CM | POA: Diagnosis not present

## 2018-12-21 DIAGNOSIS — E538 Deficiency of other specified B group vitamins: Secondary | ICD-10-CM | POA: Diagnosis not present

## 2018-12-21 DIAGNOSIS — E876 Hypokalemia: Secondary | ICD-10-CM

## 2018-12-21 DIAGNOSIS — J301 Allergic rhinitis due to pollen: Secondary | ICD-10-CM

## 2018-12-21 DIAGNOSIS — I712 Thoracic aortic aneurysm, without rupture: Secondary | ICD-10-CM

## 2018-12-21 DIAGNOSIS — F329 Major depressive disorder, single episode, unspecified: Secondary | ICD-10-CM

## 2018-12-21 DIAGNOSIS — Z1211 Encounter for screening for malignant neoplasm of colon: Secondary | ICD-10-CM

## 2018-12-21 DIAGNOSIS — F32A Depression, unspecified: Secondary | ICD-10-CM

## 2018-12-21 DIAGNOSIS — I7121 Aneurysm of the ascending aorta, without rupture: Secondary | ICD-10-CM

## 2018-12-21 MED ORDER — VENLAFAXINE HCL 37.5 MG PO TABS: 37.5000 mg | ORAL_TABLET | Freq: Two times a day (BID) | ORAL | 11 refills | Status: DC

## 2018-12-21 MED ORDER — LINACLOTIDE 72 MCG PO CAPS: 72.0000 ug | ORAL_CAPSULE | Freq: Every day | ORAL | 6 refills | Status: DC

## 2018-12-21 NOTE — Telephone Encounter (Signed)
Start with 72 mcg, which is the lowest dose once daily by mouth 30 minutes before the first meal.  Give her 14 pills and if this does not work may have to increase to 145 mcg.

## 2018-12-21 NOTE — Addendum Note (Signed)
Addended by: Zannie Cove on: 12/21/2018 11:38 AM   Modules accepted: Orders

## 2018-12-21 NOTE — Telephone Encounter (Signed)
Sent in

## 2018-12-21 NOTE — Progress Notes (Signed)
Virtual Visit Via telephone Note I connected with@ on 12/21/18 by telephone and verified that I am speaking with the correct person or authorized healthcare agent using two identifiers. [name] is currently located at home and there are no unauthorized people in close proximity. I completed this visit while in a private location in my home .  This visit type was conducted due to national recommendations for restrictions regarding the COVID-19 Pandemic (e.g. social distancing).  This format is felt to be most appropriate for this patient at this time.  All issues noted in this document were discussed and addressed.  No physical exam was performed.    I discussed the limitations, risks, security and privacy concerns of performing an evaluation and management service by telephone and the availability of in person appointments. I also discussed with the patient that there may be a patient responsible charge related to this service. The patient expressed understanding and agreed to proceed.   Date:  12/21/2018    ID:  [name]      May 31, 1941        [id number]   Patient Care Team Patient Care Team: Chipper Herb, MD as PCP - General (Family Medicine) Steffanie Rainwater, DPM as Consulting Physician (Podiatry) Minus Breeding, MD as Consulting Physician (Cardiology) Netta Cedars, MD as Consulting Physician (Orthopedic Surgery) Clarene Essex, MD as Consulting Physician (Gastroenterology) Irine Seal, MD as Attending Physician (Urology) Latanya Maudlin, MD as Consulting Physician (Orthopedic Surgery) Harlen Labs, MD as Referring Physician (Optometry) Ivin Poot, MD as Consulting Physician (Cardiothoracic Surgery) Melida Quitter, MD as Consulting Physician (Otolaryngology)  Reason for Visit: Primary Care Follow-up     History of Present Illness & Review of Systems:     [name] is a 78 y.o. year old female primary care patient that presents today for a telehealth  visit.  The patient is doing well but still is somewhat anxious about the tracheostomy which they are not able to discontinue with.  We reminded her to be patient with the situation and at the right time everything will be okay.  She needs reassurance.  She denies any chest pain pressure tightness or shortness of breath.  She does have some problems with constipation and has had some increased reflux issues.  She denies any blood in the stool or black tarry bowel movements or trouble swallowing other than the reflux issues.  She is passing her water well.  She is not had any falls injuries or accidents recently.  She is needing refills on her Effexor Linzess and will start Pepcid AC for her reflux symptoms.  She will get her blood work done at her next B12 injection time.  Review of systems as stated, otherwise negative.  The patient does not have symptoms concerning for COVID-19 infection (fever, chills, cough, or new shortness of breath).      Current Medications (Verified) Allergies as of 12/21/2018      Reactions   Actonel [risedronate] Other (See Comments)   Bone pain and nausea   Lipitor [atorvastatin Calcium] Other (See Comments)   myalgias   Lyrica [pregabalin] Other (See Comments)   SORES IN MOUTH    Sibutramine Hcl Monohydrate Other (See Comments)   UNSPECIFIED REACTION    Alendronate Sodium Nausea Only   Latex Rash, Other (See Comments)   RA to latex 1982; includes bandaids    Levofloxacin Nausea And Vomiting   Meloxicam Other (See Comments)  Vaginal itching       Medication List       Accurate as of December 21, 2018 10:01 AM. If you have any questions, ask your nurse or doctor.        aspirin 81 MG chewable tablet Chew 81 mg by mouth at bedtime.   calcium carbonate 1250 (500 Ca) MG tablet Commonly known as: OS-CAL - dosed in mg of elemental calcium Take 1 tablet (500 mg of elemental calcium total) by mouth daily with breakfast.   clonazePAM 0.25 MG disintegrating  tablet Commonly known as: KLONOPIN DISSOLVE 1 TABLET BY MOUTH 3 TIMES A DAY   furosemide 20 MG tablet Commonly known as: LASIX Take 10 mg by mouth daily. 10 mg po  Mon/Wed/Fridays   guaiFENesin 100 MG/5ML Soln Commonly known as: ROBITUSSIN Take 5 mLs (100 mg total) by mouth 3 (three) times daily with meals.   latanoprost 0.005 % ophthalmic solution Commonly known as: XALATAN Place 1 drop into both eyes at bedtime.   levothyroxine 112 MCG tablet Commonly known as: SYNTHROID Take 112 mcg by mouth every other day. Alternates doses   levothyroxine 100 MCG tablet Commonly known as: SYNTHROID Take 100 mcg by mouth See admin instructions. Take one tablet by mouth every other day (alternates doses)   loratadine 10 MG tablet Commonly known as: CLARITIN TAKE 1 TABLET DAILY   magnesium oxide 400 (241.3 Mg) MG tablet Commonly known as: MAG-OX Take 1 tablet (400 mg total) by mouth daily. What changed: when to take this   multivitamin with minerals Tabs tablet Take 1 tablet by mouth daily.   potassium chloride 10 MEQ tablet Commonly known as: K-DUR Take 2 tablets (20 mEq total) by mouth 2 (two) times daily.   Proctozone-HC 2.5 % rectal cream Generic drug: hydrocortisone APPLY RECTALLY 2 TIMES A DAY AS NEEDED   Proctozone-HC 2.5 % rectal cream Generic drug: hydrocortisone APPLY RECTALLY 2 TIMES A DAY AS NEEDED   QUEtiapine 25 MG tablet Commonly known as: SEROQUEL TAKE 3 TABLETS AT BEDTIME AS DIRECTED   simvastatin 80 MG tablet Commonly known as: ZOCOR Take 1 tablet (80 mg total) by mouth daily.   venlafaxine 37.5 MG tablet Commonly known as: EFFEXOR Take 1 tablet (37.5 mg total) by mouth 2 (two) times daily.           Allergies (Verified)    Actonel [risedronate], Lipitor [atorvastatin calcium], Lyrica [pregabalin], Sibutramine hcl monohydrate, Alendronate sodium, Latex, Levofloxacin, and Meloxicam  Past Medical History Past Medical History:  Diagnosis Date  .  ACL tear    right  Dr. Gladstone Pih   . Adenomatous polyp   . Anxiety   . Arthritis    "all over" (04/12/2018)  . Ascending aortic aneurysm ([place])    note per chart per Dr Lucianne Lei Tright 4.7 cm 04/15/2015   . Asthma   . B12 deficiency   . Chronic bronchitis ([place])   . Depression   . DUB (dysfunctional uterine bleeding) 10/96  . Fibromyalgia   . GERD (gastroesophageal reflux disease)   . Glaucoma    bilaterally  . Helicobacter pylori (H. pylori)   . Hiatal hernia   . History of blood transfusion 06/2017   "related to [facility]"  . History of kidney stones   . History of left shoulder fracture    pt states fell off bed and broke ball in shoulder had rod placed   . Hyperlipidemia   . Hypertension   . Hypothyroidism   . Inspiratory stridor   .  Laryngeal stenosis   . Occasional tremors    left arm   . Osteoarthritis   . Osteopenia   . Osteoporosis   . Thyroid cancer ([place]) 2017  . Thyroid nodule   . Tracheostomy in place [facility])      Past Surgical History:  Procedure Laterality Date  . AORTIC VALVE REPLACEMENT N/A 07/10/2017   Procedure: AORTIC VALVE REPLACEMENT (AVR);  Surgeon: [name], Collier Salina, MD;  Location: [place];  Service: Open Heart Surgery;  Laterality: N/A;  . APPENDECTOMY    . BUNIONECTOMY  08/1999   right - Dr. Irving Shows   . CARDIAC VALVE REPLACEMENT    . CATARACT EXTRACTION, BILATERAL Bilateral 05/28/07-2009   "right-left"  . CHOLECYSTECTOMY OPEN  1979  . DILATION AND CURETTAGE OF UTERUS  03/31/95   Dr. Ovid Curd   . DIRECT LARYNGOSCOPY WITH BOTOX INJECTION N/A 12/01/2017   Procedure: SUSPENDED DIRECT MICROLARYNGOSCOPY WITH KENALOG INJECTION, CO2 LASER;  Surgeon: Melida Quitter, MD;  Location: [place];  Service: ENT;  Laterality: N/A;  . EXCISIONAL HEMORRHOIDECTOMY    . EXPLORATION POST OPERATIVE OPEN HEART N/A 07/11/2017   Procedure: EXPLORATION POST OPERATIVE OPEN HEART;  Surgeon: Ivin Poot, MD;  Location: [place];  Service: Open Heart Surgery;  Laterality: N/A;  . EYE SURGERY      also had left cataract removed   . FRACTURE SURGERY    . HEMATOMA EVACUATION N/A 07/11/2017   Procedure: EVACUATION HEMATOMA;  Surgeon: Ivin Poot, MD;  Location: [place];  Service: Open Heart Surgery;  Laterality: N/A;  . HERNIA REPAIR    . JOINT REPLACEMENT    . MICROLARYNGOSCOPY WITH LASER AND BALLOON DILATION N/A 01/24/2018   Procedure: MICROLARYNGOSCOPY WITH LASER AND BALLOON DILATION;  Surgeon: Melida Quitter, MD;  Location: [place];  Service: ENT;  Laterality: N/A;  . MICROLARYNGOSCOPY WITH LASER AND BALLOON DILATION N/A 04/13/2018   Procedure: MICROLARYNGOSCOPY WITH BALLOON DILATION;  Surgeon: Melida Quitter, MD;  [facility];  Service: ENT;  Laterality: N/A;  . NEPHROLITHOTOMY Left 04/07/2017   Procedure: NEPHROLITHOTOMY PERCUTANEOUS WITH SURGEON ACCESS;  Surgeon: Ardis Hughs, MD;  [facility];  Service: Urology;  Laterality: Left;  . ORIF HUMERUS FRACTURE  05/30/2011   Procedure: OPEN REDUCTION INTERNAL FIXATION (ORIF) PROXIMAL HUMERUS FRACTURE;  Surgeon: Augustin Schooling;  Location: [place];  Service: Orthopedics;  Laterality: Left;  open reduction internal fixation of proximal humerus fracture  . PLACEMENT OF CENTRIMAG VENTRICULAR ASSIST DEVICE Right 07/11/2017   Procedure: PLACEMENT OF CENTRIMAG VENTRICULAR ASSIST DEVICE;  Surgeon: Ivin Poot, MD;  Location: [place];  Service: Open Heart Surgery;  Laterality: Right;  . REMOVAL OF CENTRIMAG VENTRICULAR ASSIST DEVICE N/A 07/17/2017   Procedure: REMOVAL OF RVAD WITH PUMP STANDBY;  Surgeon: Ivin Poot, MD;  Location: [place];  Service: Open Heart Surgery;  Laterality: N/A;  . REPLACEMENT ASCENDING AORTA N/A 07/10/2017   Procedure: REPLACEMENT ASCENDING AORTA;  Surgeon: Ivin Poot, MD;  Location: [place];  Service: Open Heart Surgery;  Laterality: N/A;  . RIGHT/LEFT HEART CATH AND CORONARY ANGIOGRAPHY N/A 06/13/2017   Procedure: RIGHT/LEFT HEART CATH AND CORONARY ANGIOGRAPHY;  Surgeon: Jolaine Artist, MD;   Location: [place] CV LAB;  Service: Cardiovascular;  Laterality: N/A;  . TEE WITHOUT CARDIOVERSION N/A 07/10/2017   Procedure: TRANSESOPHAGEAL ECHOCARDIOGRAM (TEE);  Surgeon: [name], Collier Salina, MD;  Location: [place];  Service: Open Heart Surgery;  Laterality: N/A;  . TEE WITHOUT CARDIOVERSION  07/11/2017   Procedure: TRANSESOPHAGEAL  ECHOCARDIOGRAM (TEE);  Surgeon: [name], Collier Salina, MD;  Location: [place];  Service: Open Heart Surgery;;  . TEE WITHOUT CARDIOVERSION N/A 07/17/2017   Procedure: TRANSESOPHAGEAL ECHOCARDIOGRAM (TEE);  Surgeon: [name], Collier Salina, MD;  Location: [place];  Service: Open Heart Surgery;  Laterality: N/A;  . THYROID LOBECTOMY Left 07/27/2015   Procedure: LEFT THYROID LOBECTOMY;  Surgeon: Fanny Skates, MD;  [facility];  Service: General;  Laterality: Left;  . THYROIDECTOMY N/A 08/06/2015   Procedure: RIGHT THYROID LOBECTOMY, REIMPLANTATION PARATHYROID;  Surgeon: Fanny Skates, MD;  [facility];  Service: General;  Laterality: N/A;  . TOTAL KNEE ARTHROPLASTY Right 08/2009   Dr. Gladstone Pih  . TRACHEOSTOMY  08/08/2017  . VENTRAL HERNIA REPAIR  2/99    Social History   Socioeconomic History  . Marital status: Married    Spouse name: Not on file  . Number of children: 1  . Years of education: 55  . Highest education level: 11th grade  Occupational History  . Occupation: homemaker  Social Needs  . Financial resource strain: Not hard at all  . Food insecurity    Worry: Never true    Inability: Never true  . Transportation needs    Medical: No    Non-medical: No  Tobacco Use  . Smoking status: Never Smoker  . Smokeless tobacco: Never Used  Substance and Sexual Activity  . Alcohol use: No  . Drug use: Never  . Sexual activity: Not Currently    Birth control/protection: Abstinence  Lifestyle  . Physical activity    Days per week: 0 days    Minutes per session: 0 min  . Stress: Only a little  Relationships  . Social connections    Talks on phone: More  than three times a week    Gets together: More than three times a week    Attends religious service: Never    Active member of club or organization: No    Attends meetings of clubs or organizations: Never    Relationship status: Married  Other Topics Concern  . Not on file  Social History Narrative   Lives with husband.      Family History  Problem Relation Age of Onset  . Cancer Mother        PANCREATIC   . Osteoporosis Mother   . Hip fracture Mother   . Heart attack Father 63       Died 68  . Heart disease Father   . Arthritis Father   . Hypertension Sister   . Cancer Sister        skin  . Hypertension Sister   . Cancer Sister        breast cancer  . Breast cancer Neg Hx       Labs/Other Tests and Data Reviewed:    Wt Readings from Last 3 Encounters:  10/10/18 160 lb (72.6 kg)  08/02/18 160 lb (72.6 kg)  07/25/18 162 lb (73.5 kg)   Temp Readings from Last 3 Encounters:  10/10/18 (!) 96.7 F (35.9 C) (Tympanic)  08/02/18 (!) 96 F (35.6 C) (Oral)  05/29/18 (!) 97.4 F (36.3 C) (Oral)   BP Readings from Last 3 Encounters:  10/10/18 120/80  08/02/18 134/70  07/25/18 120/62   Pulse Readings from Last 3 Encounters:  10/10/18 90  08/02/18 80  07/25/18 75     Lab Results  Component Value Date   HGBA1C 5.2 07/06/2017   HGBA1C 5.0 12/15/2015   Lab Results  Component  Value Date   LDLCALC 165 (H) 07/30/2018   CREATININE 0.92 07/30/2018       Chemistry      Component Value Date/Time   NA 142 07/30/2018 1015   K 4.5 07/30/2018 1015   CL 103 07/30/2018 1015   CO2 23 07/30/2018 1015   BUN 11 07/30/2018 1015   CREATININE 0.92 07/30/2018 1015   CREATININE 0.65 10/25/2012 0826      Component Value Date/Time   CALCIUM 8.5 (L) 07/30/2018 1015   ALKPHOS 104 07/30/2018 1015   AST 27 07/30/2018 1015   ALT 16 07/30/2018 1015   BILITOT 0.3 07/30/2018 1015         OBSERVATIONS/ OBJECTIVE:     The patient responded appropriately to questions  asked of her.  Her husband assisted with the medication list.  Her blood pressures have been good at home but they do not have any specific readings.  Her weight is stable and the readings for blood pressure have been running in the 120s over the 70s.  She had an eye exam 5 months ago and everything was good and she has not been running any fever.  Uses a cane with walking and is trying to be careful so as not to fall.  Physical exam deferred due to nature of telephonic visit.  ASSESSMENT & PLAN    Time:   Today, I have spent 28 minutes with the patient via telephone discussing the above including Covid precautions.     Visit Diagnoses: 1. B12 deficiency -Continue with monthly B12 injections -Check B12 level and get lab work in 3 weeks with next B12 injection  2. Essential hypertension -Blood pressures have been good at home running in the 120s over the 70s and the patient will continue with her current treatment and watching sodium intake  3. Vitamin B 12 deficiency -Continue with monthly B12 injections  4. Thyroid cancer [facility]) -Follow-up with oncologist  5. Anxiety and depression -Continue with current treatment and try not to increase medicines anymore than she is currently taking and avoid caffeine  6. Hypokalemia -Continue with current potassium treatment regimen  7. Anxiety about health -Continue with current treatment  8. Seasonal allergic rhinitis due to pollen -Continue with Claritin  Patient Instructions  Continue to follow-up with vascular surgeon Continue to follow-up with cardiology Continue to follow-up with ear nose and throat specialist regarding tracheostomy Continue to follow-up with oncology regarding thyroid cancer Continue to practice good hand and respiratory hygiene Always be careful and do not put yourself at risk for falling because of being prone to having accidents. Get DEXA scan every 2 years Drink plenty of water and fluids and stay  well-hydrated Refill Linzess and Effexor Start Pepcid AC twice daily before breakfast and supper    The above assessment and management plan was discussed with the patient. The patient verbalized understanding of and has agreed to the management plan. Patient is aware to call the clinic if symptoms persist or worsen. Patient is aware when to return to the clinic for a follow-up visit. Patient educated on when it is appropriate to go to the emergency department.    Chipper Herb, MD [place], [place], [place] [address] Ph [phone]   Arrie Senate MD

## 2018-12-21 NOTE — Patient Instructions (Addendum)
Continue to follow-up with vascular surgeon Continue to follow-up with cardiology Continue to follow-up with ear nose and throat specialist regarding tracheostomy Continue to follow-up with oncology regarding thyroid cancer Continue to practice good hand and respiratory hygiene Always be careful and do not put yourself at risk for falling because of being prone to having accidents. Get DEXA scan every 2 years Drink plenty of water and fluids and stay well-hydrated Start Pepcid AC 1 twice daily before breakfast and supper for 4 weeks and then after 4 weeks if reflux symptoms are better reduce this to once daily before breakfast We will restart Linzess on her because of ongoing constipation problems and refill her Effexor.

## 2018-12-28 ENCOUNTER — Telehealth: Payer: Self-pay | Admitting: *Deleted

## 2018-12-28 MED ORDER — LUBIPROSTONE 8 MCG PO CAPS: 8.0000 ug | ORAL_CAPSULE | Freq: Two times a day (BID) | ORAL | 3 refills | Status: DC

## 2018-12-28 NOTE — Addendum Note (Signed)
Addended by: Caryl Pina on: 12/28/2018 03:57 PM   Modules accepted: Orders

## 2018-12-28 NOTE — Telephone Encounter (Signed)
Please let the patient know that I tried to send Amitiza which also can be expensive, if it is not covered than the best option for her to use his over-the-counter loperamide.

## 2018-12-28 NOTE — Telephone Encounter (Signed)
Linzess 51mcg not covered by insurance please send alternative

## 2019-01-03 ENCOUNTER — Telehealth: Payer: Self-pay | Admitting: *Deleted

## 2019-01-03 NOTE — Telephone Encounter (Signed)
We have already tried Linzess and amitiza, the only other option for her is to do over-the-counter Imodium

## 2019-01-03 NOTE — Telephone Encounter (Signed)
Insurance doesn't cover Amitiza 38mcg, please send alternate.

## 2019-01-04 ENCOUNTER — Other Ambulatory Visit: Payer: Self-pay | Admitting: *Deleted

## 2019-01-04 MED ORDER — QUETIAPINE FUMARATE 25 MG PO TABS: ORAL_TABLET | ORAL | 5 refills | Status: DC

## 2019-01-07 NOTE — Telephone Encounter (Signed)
Patient aware and states she does not need this medication.  She is going to the restroom everyday now

## 2019-01-17 DIAGNOSIS — L929 Granulomatous disorder of the skin and subcutaneous tissue, unspecified: Secondary | ICD-10-CM | POA: Diagnosis not present

## 2019-01-17 DIAGNOSIS — J386 Stenosis of larynx: Secondary | ICD-10-CM | POA: Diagnosis not present

## 2019-01-17 DIAGNOSIS — R490 Dysphonia: Secondary | ICD-10-CM | POA: Diagnosis not present

## 2019-01-17 DIAGNOSIS — Z930 Tracheostomy status: Secondary | ICD-10-CM | POA: Diagnosis not present

## 2019-01-17 DIAGNOSIS — J3802 Paralysis of vocal cords and larynx, bilateral: Secondary | ICD-10-CM | POA: Diagnosis not present

## 2019-01-17 DIAGNOSIS — R0602 Shortness of breath: Secondary | ICD-10-CM | POA: Insufficient documentation

## 2019-01-18 ENCOUNTER — Other Ambulatory Visit: Payer: Self-pay

## 2019-01-18 ENCOUNTER — Ambulatory Visit (INDEPENDENT_AMBULATORY_CARE_PROVIDER_SITE_OTHER): Payer: Medicare Other | Admitting: Family Medicine

## 2019-01-18 ENCOUNTER — Encounter: Payer: Self-pay | Admitting: Family Medicine

## 2019-01-18 VITALS — BP 122/72 | HR 82 | Temp 98.4°F | Ht 65.0 in | Wt 174.0 lb

## 2019-01-18 DIAGNOSIS — E7800 Pure hypercholesterolemia, unspecified: Secondary | ICD-10-CM | POA: Diagnosis not present

## 2019-01-18 DIAGNOSIS — R059 Cough, unspecified: Secondary | ICD-10-CM

## 2019-01-18 DIAGNOSIS — Z930 Tracheostomy status: Secondary | ICD-10-CM

## 2019-01-18 DIAGNOSIS — E890 Postprocedural hypothyroidism: Secondary | ICD-10-CM | POA: Insufficient documentation

## 2019-01-18 DIAGNOSIS — I480 Paroxysmal atrial fibrillation: Secondary | ICD-10-CM | POA: Diagnosis not present

## 2019-01-18 DIAGNOSIS — E538 Deficiency of other specified B group vitamins: Secondary | ICD-10-CM | POA: Diagnosis not present

## 2019-01-18 DIAGNOSIS — F339 Major depressive disorder, recurrent, unspecified: Secondary | ICD-10-CM

## 2019-01-18 DIAGNOSIS — Z6828 Body mass index (BMI) 28.0-28.9, adult: Secondary | ICD-10-CM | POA: Diagnosis not present

## 2019-01-18 DIAGNOSIS — B372 Candidiasis of skin and nail: Secondary | ICD-10-CM

## 2019-01-18 DIAGNOSIS — F411 Generalized anxiety disorder: Secondary | ICD-10-CM | POA: Insufficient documentation

## 2019-01-18 DIAGNOSIS — R05 Cough: Secondary | ICD-10-CM

## 2019-01-18 DIAGNOSIS — E559 Vitamin D deficiency, unspecified: Secondary | ICD-10-CM

## 2019-01-18 MED ORDER — KETOCONAZOLE 2 % EX CREA: 1.0000 "application " | TOPICAL_CREAM | Freq: Every day | CUTANEOUS | 0 refills | Status: DC

## 2019-01-18 MED ORDER — BENZONATATE 100 MG PO CAPS: 100.0000 mg | ORAL_CAPSULE | Freq: Three times a day (TID) | ORAL | 3 refills | Status: DC | PRN

## 2019-01-18 NOTE — Patient Instructions (Signed)
It was a pleasure seeing you today, [name].  Information regarding what we discussed is included in this packet.  Please make an appointment to see me in 3 month.   In a few days you may receive a survey in the mail or online from [employer] regarding your visit with [place] today. Please take a moment to fill this out. Your feedback is very important to our office. It can help [place] better understand your needs as well as improve your experience and satisfaction. Thank you for taking your time to complete it. We care about you.  Because of recent events of COVID-19 ("Coronavirus"), please follow CDC recommendations:   1. Wash your hand frequently 2. Avoid touching your face 3. Stay away from people who are sick 4. If you have symptoms such as fever, cough, shortness of breath then call your healthcare provider for further guidance 5. If you are sick, STAY AT HOME, unless otherwise directed by your healthcare provider. 6. Follow directions from state and national officials regarding staying safe    Please feel free to call our office if any questions or concerns arise.  Warm Regards, Monia Pouch, FNP-C [place] [address] [place], [place] [address] [phone]

## 2019-01-18 NOTE — Progress Notes (Signed)
Subjective:  Patient ID: [name], female    DOB: Aug 02, 1940, 78 y.o.   MRN: [id number]  Patient Care Team: Baruch Gouty, FNP as PCP - General (Family Medicine) Steffanie Rainwater, DPM as Consulting Physician (Podiatry) Minus Breeding, MD as Consulting Physician (Cardiology) Netta Cedars, MD as Consulting Physician (Orthopedic Surgery) Clarene Essex, MD as Consulting Physician (Gastroenterology) Irine Seal, MD as Attending Physician (Urology) Latanya Maudlin, MD as Consulting Physician (Orthopedic Surgery) Harlen Labs, MD as Referring Physician (Optometry) Ivin Poot, MD as Consulting Physician (Cardiothoracic Surgery) Melida Quitter, MD as Consulting Physician (Otolaryngology)   Chief Complaint:  Hospitalization Follow-up and Medical Management of Chronic Issues   HPI: [name] is a 78 y.o. female presenting on 01/18/2019 for Hospitalization Follow-up and Medical Management of Chronic Issues   1. Vitamin B 12 deficiency  On monthly repletion therapy. Due for IM B12 injection today.    2. PAF (paroxysmal atrial fibrillation) ([place])  Well controlled. No recent episodes. No palpitations, cheat pain, shortness of breath, dizziness, or syncope.    3. Depression, recurrent ([place])   4. GAD (generalized anxiety disorder)  Well controlled. Compliant with medications without associated side effects. Pt states she is feeling well. No SI or HI.  Depression screen [facility] 2/9 01/18/2019 08/02/2018 05/02/2018 03/12/2018 03/02/2018  Decreased Interest 0 0 0 1 0  Down, Depressed, Hopeless 0 0 1 1 0  PHQ - 2 Score 0 0 1 2 0  Altered sleeping 0 - - 0 -  Tired, decreased energy 0 - - 1 -  Change in appetite 0 - - 1 -  Feeling bad or failure about yourself  0 - - 0 -  Trouble concentrating 0 - - 0 -  Moving slowly or fidgety/restless 0 - - 0 -  Suicidal thoughts 0 - - 0 -  PHQ-9 Score 0 - - 4 -  Difficult doing work/chores - - - Somewhat difficult -  Some recent data might be hidden      5. Pure hypercholesterolemia  Compliant with medications without associated side effects. Stays active. Tries to watch diet.    6. Tracheostomy status ([place])  Pt seen and [facility] at [facility]. Pt went in for possible trach removal. She reports she had scar tissue and narrowing that prevented the removal of the trach. She states they did removed some of the scar tissue. She is not ventilator or oxygen dependent and does well with home management of trach. She is followed by otolaryngology. She was seen yesterday and has a follow up appointment next week. She does have a persistent cough. States she hs been taking robitussin for this cough and feels it is making the secretions around her trach worse and would like to try something different for the cough.    7. Hypothyroidism, postop  Pt had right and left thyroid lobectomy due to nodules and thyroid cancer. Her parathyroid was reimplanted during surgery.  She has been on repletion therapy since this time and is doing well. No hyper or hypothyroid symptoms.   8.. Vitamin D deficiency  On repletion therapy. Tolerating well. No muscle weakness, arthralgias, or recent fractures.       Relevant past medical, surgical, family, and social history reviewed and updated as indicated.  Allergies and medications reviewed and updated. Date reviewed: Chart in Epic.   Past Medical History:  Diagnosis Date  . ACL tear    right  Dr. Gladstone Pih   . Adenomatous polyp   .  Anxiety   . Arthritis    "all over" (04/12/2018)  . Ascending aortic aneurysm ([place])    note per chart per Dr Lucianne Lei Tright 4.7 cm 04/15/2015   . Asthma   . B12 deficiency   . Chronic bronchitis ([place])   . Depression   . DUB (dysfunctional uterine bleeding) 10/96  . Fibromyalgia   . GERD (gastroesophageal reflux disease)   . Glaucoma    bilaterally  . Helicobacter pylori (H. pylori)   . Hiatal hernia   . History of blood transfusion 06/2017   "related to OHS"  . History of kidney  stones   . History of left shoulder fracture    pt states fell off bed and broke ball in shoulder had rod placed   . Hyperlipidemia   . Hypertension   . Hypothyroidism   . Inspiratory stridor   . Laryngeal stenosis   . Occasional tremors    left arm   . Osteoarthritis   . Osteopenia   . Osteoporosis   . Thyroid cancer ([place]) 2017  . Thyroid nodule   . Tracheostomy in place [facility])     Past Surgical History:  Procedure Laterality Date  . AORTIC VALVE REPLACEMENT N/A 07/10/2017   Procedure: AORTIC VALVE REPLACEMENT (AVR);  Surgeon: [name], Collier Salina, MD;  Location: [place];  Service: Open Heart Surgery;  Laterality: N/A;  . APPENDECTOMY    . BUNIONECTOMY  08/1999   right - Dr. Irving Shows   . CARDIAC VALVE REPLACEMENT    . CATARACT EXTRACTION, BILATERAL Bilateral 05/28/07-2009   "right-left"  . CHOLECYSTECTOMY OPEN  1979  . DILATION AND CURETTAGE OF UTERUS  03/31/95   Dr. Ovid Curd   . DIRECT LARYNGOSCOPY WITH BOTOX INJECTION N/A 12/01/2017   Procedure: SUSPENDED DIRECT MICROLARYNGOSCOPY WITH KENALOG INJECTION, CO2 LASER;  Surgeon: Melida Quitter, MD;  Location: [place];  Service: ENT;  Laterality: N/A;  . EXCISIONAL HEMORRHOIDECTOMY    . EXPLORATION POST OPERATIVE OPEN HEART N/A 07/11/2017   Procedure: EXPLORATION POST OPERATIVE OPEN HEART;  Surgeon: Ivin Poot, MD;  Location: [place];  Service: Open Heart Surgery;  Laterality: N/A;  . EYE SURGERY     also had left cataract removed   . FRACTURE SURGERY    . HEMATOMA EVACUATION N/A 07/11/2017   Procedure: EVACUATION HEMATOMA;  Surgeon: Ivin Poot, MD;  Location: [place];  Service: Open Heart Surgery;  Laterality: N/A;  . HERNIA REPAIR    . JOINT REPLACEMENT    . MICROLARYNGOSCOPY WITH LASER AND BALLOON DILATION N/A 01/24/2018   Procedure: MICROLARYNGOSCOPY WITH LASER AND BALLOON DILATION;  Surgeon: Melida Quitter, MD;  Location: [place];  Service: ENT;  Laterality: N/A;  . MICROLARYNGOSCOPY WITH LASER AND BALLOON DILATION N/A 04/13/2018    Procedure: MICROLARYNGOSCOPY WITH BALLOON DILATION;  Surgeon: Melida Quitter, MD;  Location: [facility];  Service: ENT;  Laterality: N/A;  . NEPHROLITHOTOMY Left 04/07/2017   Procedure: NEPHROLITHOTOMY PERCUTANEOUS WITH SURGEON ACCESS;  Surgeon: Ardis Hughs, MD;  [facility];  Service: Urology;  Laterality: Left;  . ORIF HUMERUS FRACTURE  05/30/2011   Procedure: OPEN REDUCTION INTERNAL FIXATION (ORIF) PROXIMAL HUMERUS FRACTURE;  Surgeon: Augustin Schooling;  Location: [place];  Service: Orthopedics;  Laterality: Left;  open reduction internal fixation of proximal humerus fracture  . PLACEMENT OF CENTRIMAG VENTRICULAR ASSIST DEVICE Right 07/11/2017   Procedure: PLACEMENT OF CENTRIMAG VENTRICULAR ASSIST DEVICE;  Surgeon: Ivin Poot, MD;  Location: [place];  Service: Open Heart Surgery;  Laterality:  Right;  . REMOVAL OF CENTRIMAG VENTRICULAR ASSIST DEVICE N/A 07/17/2017   Procedure: REMOVAL OF RVAD WITH PUMP STANDBY;  Surgeon: Ivin Poot, MD;  Location: [place];  Service: Open Heart Surgery;  Laterality: N/A;  . REPLACEMENT ASCENDING AORTA N/A 07/10/2017   Procedure: REPLACEMENT ASCENDING AORTA;  Surgeon: Ivin Poot, MD;  Location: [place];  Service: Open Heart Surgery;  Laterality: N/A;  . RIGHT/LEFT HEART CATH AND CORONARY ANGIOGRAPHY N/A 06/13/2017   Procedure: RIGHT/LEFT HEART CATH AND CORONARY ANGIOGRAPHY;  Surgeon: Jolaine Artist, MD;  Location: [place] [facility];  Service: Cardiovascular;  Laterality: N/A;  . TEE WITHOUT CARDIOVERSION N/A 07/10/2017   Procedure: TRANSESOPHAGEAL ECHOCARDIOGRAM (TEE);  Surgeon: [name], Collier Salina, MD;  Location: [place];  Service: Open Heart Surgery;  Laterality: N/A;  . TEE WITHOUT CARDIOVERSION  07/11/2017   Procedure: TRANSESOPHAGEAL ECHOCARDIOGRAM (TEE);  Surgeon: [name], Collier Salina, MD;  Location: [place];  Service: Open Heart Surgery;;  . TEE WITHOUT CARDIOVERSION N/A 07/17/2017   Procedure: TRANSESOPHAGEAL ECHOCARDIOGRAM (TEE);  Surgeon: [name],  Collier Salina, MD;  Location: [place];  Service: Open Heart Surgery;  Laterality: N/A;  . THYROID LOBECTOMY Left 07/27/2015   Procedure: LEFT THYROID LOBECTOMY;  Surgeon: Fanny Skates, MD;  [facility];  Service: General;  Laterality: Left;  . THYROIDECTOMY N/A 08/06/2015   Procedure: RIGHT THYROID LOBECTOMY, REIMPLANTATION PARATHYROID;  Surgeon: Fanny Skates, MD;  [facility];  Service: General;  Laterality: N/A;  . TOTAL KNEE ARTHROPLASTY Right 08/2009   Dr. Gladstone Pih  . TRACHEOSTOMY  08/08/2017  . VENTRAL HERNIA REPAIR  2/99    Social History   Socioeconomic History  . Marital status: Married    Spouse name: Not on file  . Number of children: 1  . Years of education: 1  . Highest education level: 11th grade  Occupational History  . Occupation: homemaker  Social Needs  . Financial resource strain: Not hard at all  . Food insecurity    Worry: Never true    Inability: Never true  . Transportation needs    Medical: No    Non-medical: No  Tobacco Use  . Smoking status: Never Smoker  . Smokeless tobacco: Never Used  Substance and Sexual Activity  . Alcohol use: No  . Drug use: Never  . Sexual activity: Not Currently    Birth control/protection: Abstinence  Lifestyle  . Physical activity    Days per week: 0 days    Minutes per session: 0 min  . Stress: Only a little  Relationships  . Social connections    Talks on phone: More than three times a week    Gets together: More than three times a week    Attends religious service: Never    Active member of club or organization: No    Attends meetings of clubs or organizations: Never    Relationship status: Married  . Intimate partner violence    Fear of current or ex partner: Not on file    Emotionally abused: Not on file    Physically abused: Not on file    Forced sexual activity: Not on file  Other Topics Concern  . Not on file  Social History Narrative   Lives with husband.     Outpatient Encounter Medications  as of 01/18/2019  Medication Sig  . aspirin 81 MG chewable tablet Chew 81 mg by mouth at bedtime.   . calcium carbonate (OS-CAL - DOSED IN MG OF ELEMENTAL CALCIUM) 1250 (500  Ca) MG tablet Take 1 tablet (500 mg of elemental calcium total) by mouth daily with breakfast.  . clonazePAM (KLONOPIN) 0.25 MG disintegrating tablet DISSOLVE 1 TABLET BY MOUTH 3 TIMES A DAY  . furosemide (LASIX) 20 MG tablet Take 10 mg by mouth daily. 10 mg po  Mon/Wed/Fridays  . latanoprost (XALATAN) 0.005 % ophthalmic solution Place 1 drop into both eyes at bedtime.  Marland Kitchen levothyroxine (SYNTHROID, LEVOTHROID) 100 MCG tablet Take 100 mcg by mouth See admin instructions. Take one tablet by mouth every other day (alternates doses)  . levothyroxine (SYNTHROID, LEVOTHROID) 112 MCG tablet Take 112 mcg by mouth every other day. Alternates doses  . loratadine (CLARITIN) 10 MG tablet TAKE 1 TABLET DAILY  . magnesium oxide (MAG-OX) 400 (241.3 Mg) MG tablet Take 1 tablet (400 mg total) by mouth daily. (Patient taking differently: Take 400 mg by mouth 2 (two) times daily. )  . Multiple Vitamin (MULTIVITAMIN WITH MINERALS) TABS tablet Take 1 tablet by mouth daily.  . potassium chloride (K-DUR) 10 MEQ tablet Take 2 tablets (20 mEq total) by mouth 2 (two) times daily.  . QUEtiapine (SEROQUEL) 25 MG tablet TAKE 3 TABLETS AT BEDTIME AS DIRECTED  . simvastatin (ZOCOR) 80 MG tablet Take 1 tablet (80 mg total) by mouth daily.  Marland Kitchen venlafaxine (EFFEXOR) 37.5 MG tablet Take 1 tablet (37.5 mg total) by mouth 2 (two) times daily.  . [DISCONTINUED] guaiFENesin (ROBITUSSIN) 100 MG/5ML SOLN Take 5 mLs (100 mg total) by mouth 3 (three) times daily with meals.  . [DISCONTINUED] linaclotide (LINZESS) 72 MCG capsule Take 1 capsule (72 mcg total) by mouth daily before breakfast.  . benzonatate (TESSALON PERLES) 100 MG capsule Take 1 capsule (100 mg total) by mouth 3 (three) times daily as needed for cough.  Marland Kitchen ketoconazole (NIZORAL) 2 % cream Apply 1 application  topically daily.  . [DISCONTINUED] lubiprostone (AMITIZA) 8 MCG capsule Take 1 capsule (8 mcg total) by mouth 2 (two) times daily with a meal.  . [DISCONTINUED] PROCTOZONE-HC 2.5 % rectal cream APPLY RECTALLY 2 TIMES A DAY AS NEEDED  . [DISCONTINUED] PROCTOZONE-HC 2.5 % rectal cream APPLY RECTALLY 2 TIMES A DAY AS NEEDED (Patient not taking: Reported on 12/21/2018)   Facility-Administered Encounter Medications as of 01/18/2019  Medication  . cyanocobalamin ((VITAMIN B-12)) injection 1,000 mcg    Allergies  Allergen Reactions  . Actonel [Risedronate] Other (See Comments)    Bone pain and nausea   . Lipitor [Atorvastatin Calcium] Other (See Comments)    myalgias  . Lyrica [Pregabalin] Other (See Comments)    SORES IN MOUTH   . Sibutramine Hcl Monohydrate Other (See Comments)    UNSPECIFIED REACTION   . Alendronate Sodium Nausea Only  . Latex Rash and Other (See Comments)    RA to latex 1982; includes bandaids    . Levofloxacin Nausea And Vomiting  . Meloxicam Other (See Comments)    Vaginal itching     Review of Systems  Constitutional: Negative for activity change, appetite change, chills, diaphoresis, fatigue, fever and unexpected weight change.  HENT: Negative.  Negative for congestion.   Eyes: Negative.  Negative for photophobia and visual disturbance.  Respiratory: Positive for cough. Negative for apnea, choking, chest tightness, shortness of breath and wheezing.        Trach in place, not ventilator or oxygen dependent.   Cardiovascular: Negative for chest pain, palpitations and leg swelling.  Gastrointestinal: Negative for abdominal distention, abdominal pain, anal bleeding, blood in stool, constipation, diarrhea, nausea, rectal  pain and vomiting.  Endocrine: Negative.  Negative for cold intolerance, heat intolerance, polydipsia, polyphagia and polyuria.  Genitourinary: Negative for decreased urine volume, difficulty urinating, dysuria, frequency and urgency.   Musculoskeletal: Negative for arthralgias and myalgias.  Skin: Positive for rash (pannus).  Allergic/Immunologic: Negative.   Neurological: Negative for dizziness, tremors, seizures, syncope, facial asymmetry, speech difficulty, weakness, light-headedness, numbness and headaches.  Hematological: Negative.   Psychiatric/Behavioral: Negative for confusion, hallucinations, sleep disturbance and suicidal ideas.  All other systems reviewed and are negative.       Objective:  BP 122/72   Pulse 82   Temp 98.4 F (36.9 C) (Oral)   Ht 5' 5"  (1.651 m)   Wt 174 lb (78.9 kg)   BMI 28.96 kg/m    Wt Readings from Last 3 Encounters:  01/18/19 174 lb (78.9 kg)  10/10/18 160 lb (72.6 kg)  08/02/18 160 lb (72.6 kg)    Physical Exam Vitals signs and nursing note reviewed.  Constitutional:      General: She is not in acute distress.    Appearance: Normal appearance. She is well-developed, well-groomed and overweight. She is not ill-appearing, toxic-appearing or diaphoretic.  HENT:     Head: Normocephalic and atraumatic.     Jaw: There is normal jaw occlusion.     Right Ear: Hearing normal.     Left Ear: Hearing normal.     Nose: Nose normal.     Mouth/Throat:     Lips: Pink.     Mouth: Mucous membranes are moist.     Pharynx: Oropharynx is clear. Uvula midline.  Eyes:     General: Lids are normal.     Extraocular Movements: Extraocular movements intact.     Conjunctiva/sclera: Conjunctivae normal.     Pupils: Pupils are equal, round, and reactive to light.  Neck:     Musculoskeletal: Normal range of motion and neck supple.     Vascular: No carotid bruit or JVD.     Trachea: Phonation normal. Tracheostomy (clean, dry, and intact. Great phonation.) present. No abnormal tracheal secretions.  Cardiovascular:     Rate and Rhythm: Normal rate and regular rhythm.     Chest Wall: PMI is not displaced.     Pulses: Normal pulses.     Heart sounds: Murmur present. Systolic murmur present  with a grade of 2/6. No friction rub. No gallop.   Pulmonary:     Effort: Pulmonary effort is normal. No respiratory distress.     Breath sounds: Normal breath sounds. No wheezing.  Abdominal:     General: Bowel sounds are normal. There is no distension or abdominal bruit.     Palpations: Abdomen is soft. There is no hepatomegaly or splenomegaly.     Tenderness: There is no abdominal tenderness. There is no right CVA tenderness or left CVA tenderness.     Hernia: No hernia is present.  Musculoskeletal: Normal range of motion.     Right lower leg: No edema.     Left lower leg: No edema.  Lymphadenopathy:     Cervical: No cervical adenopathy.  Skin:    General: Skin is warm and dry.     Capillary Refill: Capillary refill takes less than 2 seconds.     Coloration: Skin is not cyanotic, jaundiced or pale.     Findings: Rash present.     Comments: Beefy red rash with erythematous satellite lesions under pannus. No purulent drainage.   Neurological:     General: No focal deficit  present.     Mental Status: She is alert and oriented to person, place, and time.     Cranial Nerves: Cranial nerves are intact.     Sensory: Sensation is intact.     Motor: Motor function is intact.     Coordination: Coordination is intact.     Gait: Gait is intact.     Deep Tendon Reflexes: Reflexes are normal and symmetric.  Psychiatric:        Attention and Perception: Attention and perception normal.        Mood and Affect: Mood and affect normal.        Speech: Speech normal.        Behavior: Behavior normal. Behavior is cooperative.        Thought Content: Thought content normal. Thought content does not include homicidal or suicidal ideation. Thought content does not include homicidal or suicidal plan.        Cognition and Memory: Cognition and memory normal.        Judgment: Judgment normal.     Results for orders placed or performed in visit on 01/18/19  CMP14+EGFR  Result Value Ref Range    Glucose 94 65 - 99 mg/dL   BUN 12 8 - 27 mg/dL   Creatinine, Ser 0.91 0.57 - 1.00 mg/dL   GFR calc non Af Amer 61 >59 mL/min/1.73   GFR calc Af Amer 71 >59 mL/min/1.73   BUN/Creatinine Ratio 13 12 - 28   Sodium 145 (H) 134 - 144 mmol/L   Potassium 3.9 3.5 - 5.2 mmol/L   Chloride 102 96 - 106 mmol/L   CO2 25 20 - 29 mmol/L   Calcium 9.0 8.7 - 10.3 mg/dL   Total Protein 6.3 6.0 - 8.5 g/dL   Albumin 4.5 3.7 - 4.7 g/dL   Globulin, Total 1.8 1.5 - 4.5 g/dL   Albumin/Globulin Ratio 2.5 (H) 1.2 - 2.2   Bilirubin Total 0.4 0.0 - 1.2 mg/dL   Alkaline Phosphatase 107 39 - 117 IU/L   AST 18 0 - 40 IU/L   ALT 9 0 - 32 IU/L  CBC with Differential/Platelet  Result Value Ref Range   WBC 4.4 3.4 - 10.8 x10E3/uL   RBC 4.11 3.77 - 5.28 x10E6/uL   Hemoglobin 11.9 11.1 - 15.9 g/dL   Hematocrit 36.7 34.0 - 46.6 %   MCV 89 79 - 97 fL   MCH 29.0 26.6 - 33.0 pg   MCHC 32.4 31.5 - 35.7 g/dL   RDW 13.0 11.7 - 15.4 %   Platelets 215 150 - 450 x10E3/uL   Neutrophils 54 Not Estab. %   Lymphs 33 Not Estab. %   Monocytes 9 Not Estab. %   Eos 3 Not Estab. %   Basos 1 Not Estab. %   Neutrophils Absolute 2.4 1.4 - 7.0 x10E3/uL   Lymphocytes Absolute 1.4 0.7 - 3.1 x10E3/uL   Monocytes Absolute 0.4 0.1 - 0.9 x10E3/uL   EOS (ABSOLUTE) 0.2 0.0 - 0.4 x10E3/uL   Basophils Absolute 0.0 0.0 - 0.2 x10E3/uL   Immature Granulocytes 0 Not Estab. %   Immature Grans (Abs) 0.0 0.0 - 0.1 x10E3/uL  Vitamin B12  Result Value Ref Range   Vitamin B-12 >2000 (H) 232 - 1245 pg/mL  VITAMIN D 25 Hydroxy (Vit-D Deficiency, Fractures)  Result Value Ref Range   Vit D, 25-Hydroxy 53.5 30.0 - 100.0 ng/mL  Thyroid Panel With TSH  Result Value Ref Range   TSH 0.493 0.450 - 4.500  uIU/mL   T4, Total 9.7 4.5 - 12.0 ug/dL   T3 Uptake Ratio 26 24 - 39 %   Free Thyroxine Index 2.5 1.2 - 4.9  Lipid panel  Result Value Ref Range   Cholesterol, Total 155 100 - 199 mg/dL   Triglycerides 101 0 - 149 mg/dL   HDL 66 >39 mg/dL   VLDL  Cholesterol Cal 20 5 - 40 mg/dL   LDL Calculated 69 0 - 99 mg/dL   Chol/HDL Ratio 2.3 0.0 - 4.4 ratio       Pertinent labs & imaging results that were available during my care of the patient were reviewed by me and considered in my medical decision making.  Assessment & Plan:  [name] was seen today for hospitalization follow-up and medical management of chronic issues.  Diagnoses and all orders for this visit:  Vitamin B 12 deficiency B12 injection given today. Labs pending. Continue monthly repletion therapy.  -     CBC with Differential/Platelet -     Vitamin B12 -     Thyroid Panel With TSH  PAF (paroxysmal atrial fibrillation) ([facility]) Stable. Not on controller medications. Report any return of symptoms.  -     CBC with Differential/Platelet  Depression, recurrent ([place]) Doing well. Continue medications. Report any new or worsening symptoms.  -     VITAMIN D 25 Hydroxy (Vit-D Deficiency, Fractures) -     Thyroid Panel With TSH  BMI 28.0-28.9,adult Diet and exercise encouraged. Labs pending. Recheck BMI in 3 months.  -     CMP14+EGFR  GAD (generalized anxiety disorder) Doing well. Will check TSH today. Continue medications as prescribed.  -     Thyroid Panel With TSH  Pure hypercholesterolemia Diet and exercise encouraged. Continue medications as prescribed. Labs pending.  -     CMP14+EGFR -     Lipid panel  Tracheostomy status ([place]) Doing well. Does need a trach collar to cover her trach when she is in the shower. Will check local medical supply stores for device.   Hypothyroidism, postop Labs pending, will change regimen if warranted.  -     Thyroid Panel With TSH  Vitamin D deficiency Labs pending. Will change repletion therapy if warranted.  -     VITAMIN D 25 Hydroxy (Vit-D Deficiency, Fractures)  Candidal intertrigo Beefy red rash with erythematous satellite lesions under pannus. Consistent with candidal intertrigo. Symptomatic care discussed. Will treat with  Nizoral cream daily for 2 weeks. Keep area clean and as dry as possible. Report any new or worsening symptoms.  -     ketoconazole (NIZORAL) 2 % cream; Apply 1 application topically daily.  Cough in adult Will trial tessalon perles for cough. Pt aware to report any new or worsening symptoms and to stay hydrated to keep secretions thin.  -     benzonatate (TESSALON PERLES) 100 MG capsule; Take 1 capsule (100 mg total) by mouth 3 (three) times daily as needed for cough.     Continue all other maintenance medications.  Follow up plan: Return in about 3 months (around 04/20/2019), or if symptoms worsen or fail to improve, for HTN, B12, Thyroid, Lipids.  Educational handout given for survey  The above assessment and management plan was discussed with the patient. The patient verbalized understanding of and has agreed to the management plan. Patient is aware to call the clinic if symptoms persist or worsen. Patient is aware when to return to the clinic for a follow-up visit. Patient educated on  when it is appropriate to go to the emergency department.   Monia Pouch, FNP-C [facility] [phone] 01/19/19

## 2019-01-19 LAB — CMP14+EGFR
ALT: 9 IU/L (ref 0–32)
AST: 18 IU/L (ref 0–40)
Albumin/Globulin Ratio: 2.5 — ABNORMAL HIGH (ref 1.2–2.2)
Albumin: 4.5 g/dL (ref 3.7–4.7)
Alkaline Phosphatase: 107 IU/L (ref 39–117)
BUN/Creatinine Ratio: 13 (ref 12–28)
BUN: 12 mg/dL (ref 8–27)
Bilirubin Total: 0.4 mg/dL (ref 0.0–1.2)
CO2: 25 mmol/L (ref 20–29)
Calcium: 9 mg/dL (ref 8.7–10.3)
Chloride: 102 mmol/L (ref 96–106)
Creatinine, Ser: 0.91 mg/dL (ref 0.57–1.00)
GFR calc Af Amer: 71 mL/min/{1.73_m2} (ref >59)
GFR calc non Af Amer: 61 mL/min/{1.73_m2} (ref >59)
Globulin, Total: 1.8 g/dL (ref 1.5–4.5)
Glucose: 94 mg/dL (ref 65–99)
Potassium: 3.9 mmol/L (ref 3.5–5.2)
Sodium: 145 mmol/L — ABNORMAL HIGH (ref 134–144)
Total Protein: 6.3 g/dL (ref 6.0–8.5)

## 2019-01-19 LAB — CBC WITH DIFFERENTIAL/PLATELET
Basophils Absolute: 0 10*3/uL (ref 0.0–0.2)
Basos: 1 %
EOS (ABSOLUTE): 0.2 10*3/uL (ref 0.0–0.4)
Eos: 3 %
Hematocrit: 36.7 % (ref 34.0–46.6)
Hemoglobin: 11.9 g/dL (ref 11.1–15.9)
Immature Grans (Abs): 0 10*3/uL (ref 0.0–0.1)
Immature Granulocytes: 0 %
Lymphocytes Absolute: 1.4 10*3/uL (ref 0.7–3.1)
Lymphs: 33 %
MCH: 29 pg (ref 26.6–33.0)
MCHC: 32.4 g/dL (ref 31.5–35.7)
MCV: 89 fL (ref 79–97)
Monocytes Absolute: 0.4 10*3/uL (ref 0.1–0.9)
Monocytes: 9 %
Neutrophils Absolute: 2.4 10*3/uL (ref 1.4–7.0)
Neutrophils: 54 %
Platelets: 215 10*3/uL (ref 150–450)
RBC: 4.11 x10E6/uL (ref 3.77–5.28)
RDW: 13 % (ref 11.7–15.4)
WBC: 4.4 10*3/uL (ref 3.4–10.8)

## 2019-01-19 LAB — THYROID PANEL WITH TSH
Free Thyroxine Index: 2.5 (ref 1.2–4.9)
T3 Uptake Ratio: 26 % (ref 24–39)
T4, Total: 9.7 ug/dL (ref 4.5–12.0)
TSH: 0.493 u[IU]/mL (ref 0.450–4.500)

## 2019-01-19 LAB — LIPID PANEL
Chol/HDL Ratio: 2.3 ratio (ref 0.0–4.4)
Cholesterol, Total: 155 mg/dL (ref 100–199)
HDL: 66 mg/dL (ref >39)
LDL Calculated: 69 mg/dL (ref 0–99)
Triglycerides: 101 mg/dL (ref 0–149)
VLDL Cholesterol Cal: 20 mg/dL (ref 5–40)

## 2019-01-19 LAB — VITAMIN D 25 HYDROXY (VIT D DEFICIENCY, FRACTURES): Vit D, 25-Hydroxy: 53.5 ng/mL (ref 30.0–100.0)

## 2019-01-19 LAB — VITAMIN B12: Vitamin B-12: >2000 pg/mL — ABNORMAL HIGH (ref 232–1245)

## 2019-01-23 DIAGNOSIS — R490 Dysphonia: Secondary | ICD-10-CM | POA: Diagnosis not present

## 2019-02-01 ENCOUNTER — Other Ambulatory Visit: Payer: Self-pay | Admitting: *Deleted

## 2019-02-01 ENCOUNTER — Telehealth: Payer: Self-pay | Admitting: Family Medicine

## 2019-02-01 ENCOUNTER — Other Ambulatory Visit: Payer: Self-pay | Admitting: Family Medicine

## 2019-02-01 DIAGNOSIS — F411 Generalized anxiety disorder: Secondary | ICD-10-CM

## 2019-02-04 DIAGNOSIS — Z430 Encounter for attention to tracheostomy: Secondary | ICD-10-CM | POA: Diagnosis not present

## 2019-02-04 MED ORDER — CLONAZEPAM 0.25 MG PO TBDP: ORAL_TABLET | ORAL | 5 refills | Status: DC

## 2019-02-04 NOTE — Telephone Encounter (Signed)
Closing encounter, address on Rx refill pool and refilled today

## 2019-02-25 ENCOUNTER — Other Ambulatory Visit: Payer: Self-pay | Admitting: Family Medicine

## 2019-02-26 DIAGNOSIS — R490 Dysphonia: Secondary | ICD-10-CM | POA: Diagnosis not present

## 2019-02-28 ENCOUNTER — Encounter: Payer: Self-pay | Admitting: *Deleted

## 2019-03-08 DIAGNOSIS — Z430 Encounter for attention to tracheostomy: Secondary | ICD-10-CM | POA: Diagnosis not present

## 2019-03-14 ENCOUNTER — Ambulatory Visit (INDEPENDENT_AMBULATORY_CARE_PROVIDER_SITE_OTHER): Payer: Medicare Other

## 2019-03-14 ENCOUNTER — Other Ambulatory Visit: Payer: Self-pay | Admitting: Family Medicine

## 2019-03-14 ENCOUNTER — Other Ambulatory Visit: Payer: Self-pay

## 2019-03-14 DIAGNOSIS — E538 Deficiency of other specified B group vitamins: Secondary | ICD-10-CM | POA: Diagnosis not present

## 2019-03-14 DIAGNOSIS — B372 Candidiasis of skin and nail: Secondary | ICD-10-CM

## 2019-03-14 NOTE — Progress Notes (Signed)
Cyanocobalamin injection to right deltoid.  Patient tolerated well. 

## 2019-03-20 DIAGNOSIS — Z930 Tracheostomy status: Secondary | ICD-10-CM | POA: Diagnosis not present

## 2019-03-20 DIAGNOSIS — J3802 Paralysis of vocal cords and larynx, bilateral: Secondary | ICD-10-CM | POA: Diagnosis not present

## 2019-03-20 DIAGNOSIS — J386 Stenosis of larynx: Secondary | ICD-10-CM | POA: Diagnosis not present

## 2019-03-20 DIAGNOSIS — R490 Dysphonia: Secondary | ICD-10-CM | POA: Diagnosis not present

## 2019-03-26 ENCOUNTER — Other Ambulatory Visit: Payer: Self-pay | Admitting: Family Medicine

## 2019-04-01 ENCOUNTER — Other Ambulatory Visit: Payer: Self-pay

## 2019-04-01 ENCOUNTER — Ambulatory Visit (INDEPENDENT_AMBULATORY_CARE_PROVIDER_SITE_OTHER): Payer: Medicare Other

## 2019-04-01 DIAGNOSIS — Z23 Encounter for immunization: Secondary | ICD-10-CM | POA: Diagnosis not present

## 2019-04-04 DIAGNOSIS — H40033 Anatomical narrow angle, bilateral: Secondary | ICD-10-CM | POA: Diagnosis not present

## 2019-04-04 DIAGNOSIS — H04123 Dry eye syndrome of bilateral lacrimal glands: Secondary | ICD-10-CM | POA: Diagnosis not present

## 2019-04-10 ENCOUNTER — Other Ambulatory Visit: Payer: Self-pay

## 2019-04-11 ENCOUNTER — Ambulatory Visit (INDEPENDENT_AMBULATORY_CARE_PROVIDER_SITE_OTHER): Payer: Medicare Other | Admitting: Family Medicine

## 2019-04-11 ENCOUNTER — Encounter: Payer: Self-pay | Admitting: Family Medicine

## 2019-04-11 VITALS — BP 113/67 | HR 83 | Temp 96.9°F | Ht 65.0 in | Wt 174.8 lb

## 2019-04-11 DIAGNOSIS — K219 Gastro-esophageal reflux disease without esophagitis: Secondary | ICD-10-CM

## 2019-04-11 DIAGNOSIS — F411 Generalized anxiety disorder: Secondary | ICD-10-CM | POA: Diagnosis not present

## 2019-04-11 DIAGNOSIS — I10 Essential (primary) hypertension: Secondary | ICD-10-CM

## 2019-04-11 DIAGNOSIS — E7800 Pure hypercholesterolemia, unspecified: Secondary | ICD-10-CM

## 2019-04-11 DIAGNOSIS — F339 Major depressive disorder, recurrent, unspecified: Secondary | ICD-10-CM

## 2019-04-11 DIAGNOSIS — K581 Irritable bowel syndrome with constipation: Secondary | ICD-10-CM

## 2019-04-11 MED ORDER — LINACLOTIDE 72 MCG PO CAPS: 72.0000 ug | ORAL_CAPSULE | Freq: Every day | ORAL | 5 refills | Status: DC

## 2019-04-11 NOTE — Progress Notes (Signed)
BP 113/67   Pulse 83   Temp (!) 96.9 F (36.1 C) (Temporal)   Ht 5' 5"  (1.651 m)   Wt 174 lb 12.8 oz (79.3 kg)   SpO2 99%   BMI 29.09 kg/m    Subjective:   Patient ID: [name], female    DOB: [birth year], 78 y.o.   MRN: [id number]  HPI: [name] is a 78 y.o. female presenting on 04/11/2019 for Establish Care ([facility] pt ) and Medical Management of Chronic Issues (4 month follow up)   HPI Hyperlipidemia Patient is coming in for recheck of his hyperlipidemia. The patient is currently taking simvastatin. They deny any issues with myalgias or history of liver damage from it. They deny any focal numbness or weakness or chest pain. Patient has been on simvastatin 80 for years without muscle issues.   Hypothyroidism recheck Dr Levada Dy endocrinology manages this Patient is coming in for thyroid recheck today as well. They deny any issues with hair changes or heat or cold problems or diarrhea or constipation. They deny any chest pain or palpitations. They are currently on levothyroxine 100 and 112 and alternating micrograms doses  Anxiety and depression and fibromyalgia Patient is currently on venlafaxine and seroquel and clonazepam. Patient says that she is doing well and denies any major issues.  Depression screen [facility] 2/9 04/11/2019 01/18/2019 08/02/2018 05/02/2018 03/12/2018  Decreased Interest 0 0 0 0 1  Down, Depressed, Hopeless 0 0 0 1 1  PHQ - 2 Score 0 0 0 1 2  Altered sleeping - 0 - - 0  Tired, decreased energy - 0 - - 1  Change in appetite - 0 - - 1  Feeling bad or failure about yourself  - 0 - - 0  Trouble concentrating - 0 - - 0  Moving slowly or fidgety/restless - 0 - - 0  Suicidal thoughts - 0 - - 0  PHQ-9 Score - 0 - - 4  Difficult doing work/chores - - - - Somewhat difficult  Some recent data might be hidden     Relevant past medical, surgical, family and social history reviewed and updated as indicated. Interim medical history since our last visit reviewed.  Allergies and medications reviewed and updated.  Review of Systems  Constitutional: Negative for chills and fever.  Eyes: Negative for visual disturbance.  Respiratory: Negative for chest tightness and shortness of breath.   Cardiovascular: Negative for chest pain and leg swelling.  Musculoskeletal: Positive for myalgias. Negative for back pain and gait problem.  Skin: Negative for rash.  Neurological: Negative for light-headedness and headaches.  Psychiatric/Behavioral: Positive for dysphoric mood and sleep disturbance. Negative for agitation, behavioral problems, self-injury and suicidal ideas. The patient is nervous/anxious.   All other systems reviewed and are negative.   Per HPI unless specifically indicated above   Allergies as of 04/11/2019      Reactions   Actonel [risedronate] Other (See Comments)   Bone pain and nausea   Lipitor [atorvastatin Calcium] Other (See Comments)   myalgias   Lyrica [pregabalin] Other (See Comments)   SORES IN MOUTH    Sibutramine Hcl Monohydrate Other (See Comments)   UNSPECIFIED REACTION    Alendronate Sodium Nausea Only   Latex Rash, Other (See Comments)   RA to latex 1982; includes bandaids    Levofloxacin Nausea And Vomiting   Meloxicam Other (See Comments)   Vaginal itching       Medication List  Accurate as of April 11, 2019 11:24 AM. If you have any questions, ask your nurse or doctor.        aspirin 81 MG chewable tablet Chew 81 mg by mouth at bedtime.   calcium carbonate 1250 (500 Ca) MG tablet Commonly known as: OS-CAL - dosed in mg of elemental calcium Take 1 tablet (500 mg of elemental calcium total) by mouth daily with breakfast.   clonazePAM 0.25 MG disintegrating tablet Commonly known as: KLONOPIN DISSOLVE 1 TABLET BY MOUTH 3 TIMES A DAY   furosemide 20 MG tablet Commonly known as: LASIX Take 10 mg by mouth daily. 10 mg po  Mon/Wed/Fridays   ketoconazole 2 % cream Commonly known as: NIZORAL Apply  topically daily.   latanoprost 0.005 % ophthalmic solution Commonly known as: XALATAN Place 1 drop into both eyes at bedtime.   levothyroxine 112 MCG tablet Commonly known as: SYNTHROID Take 112 mcg by mouth every other day. Alternates doses   levothyroxine 100 MCG tablet Commonly known as: SYNTHROID Take 100 mcg by mouth See admin instructions. Take one tablet by mouth every other day (alternates doses)   linaclotide 72 MCG capsule Commonly known as: Linzess Take 1 capsule (72 mcg total) by mouth daily before breakfast. Started by: [name] Siriah Treat, MD   loratadine 10 MG tablet Commonly known as: CLARITIN TAKE 1 TABLET DAILY   magnesium oxide 400 (241.3 Mg) MG tablet Commonly known as: MAG-OX Take 1 tablet (400 mg total) by mouth daily. What changed: when to take this   multivitamin with minerals Tabs tablet Take 1 tablet by mouth daily.   potassium chloride 10 MEQ tablet Commonly known as: KLOR-CON TAKE 2 TABLETS TWICE A DAY   QUEtiapine 25 MG tablet Commonly known as: SEROQUEL TAKE 3 TABLETS AT BEDTIME AS DIRECTED   simvastatin 80 MG tablet Commonly known as: ZOCOR Take 1 tablet (80 mg total) by mouth daily.   venlafaxine 37.5 MG tablet Commonly known as: EFFEXOR Take 1 tablet (37.5 mg total) by mouth 2 (two) times daily.        Objective:   BP 113/67   Pulse 83   Temp (!) 96.9 F (36.1 C) (Temporal)   Ht 5' 5"  (1.651 m)   Wt 174 lb 12.8 oz (79.3 kg)   SpO2 99%   BMI 29.09 kg/m   Wt Readings from Last 3 Encounters:  04/11/19 174 lb 12.8 oz (79.3 kg)  01/18/19 174 lb (78.9 kg)  10/10/18 160 lb (72.6 kg)    Physical Exam Vitals signs and nursing note reviewed.  Constitutional:      General: She is not in acute distress.    Appearance: She is well-developed. She is not diaphoretic.  Eyes:     Conjunctiva/sclera: Conjunctivae normal.  Cardiovascular:     Rate and Rhythm: Normal rate and regular rhythm.     Heart sounds: Normal heart sounds.  No murmur.  Pulmonary:     Effort: Pulmonary effort is normal. No respiratory distress.     Breath sounds: Normal breath sounds. No wheezing.  Musculoskeletal: Normal range of motion.        General: No tenderness.  Skin:    General: Skin is warm and dry.     Findings: No rash.  Neurological:     Mental Status: She is alert and oriented to person, place, and time.     Coordination: Coordination normal.  Psychiatric:        Mood and Affect: Mood is anxious and depressed.  Behavior: Behavior normal.        Thought Content: Thought content does not include suicidal ideation. Thought content does not include suicidal plan.     Results for orders placed or performed in visit on 01/18/19  CMP14+EGFR  Result Value Ref Range   Glucose 94 65 - 99 mg/dL   BUN 12 8 - 27 mg/dL   Creatinine, Ser 0.91 0.57 - 1.00 mg/dL   GFR calc non Af Amer 61 >59 mL/min/1.73   GFR calc Af Amer 71 >59 mL/min/1.73   BUN/Creatinine Ratio 13 12 - 28   Sodium 145 (H) 134 - 144 mmol/L   Potassium 3.9 3.5 - 5.2 mmol/L   Chloride 102 96 - 106 mmol/L   CO2 25 20 - 29 mmol/L   Calcium 9.0 8.7 - 10.3 mg/dL   Total Protein 6.3 6.0 - 8.5 g/dL   Albumin 4.5 3.7 - 4.7 g/dL   Globulin, Total 1.8 1.5 - 4.5 g/dL   Albumin/Globulin Ratio 2.5 (H) 1.2 - 2.2   Bilirubin Total 0.4 0.0 - 1.2 mg/dL   Alkaline Phosphatase 107 39 - 117 IU/L   AST 18 0 - 40 IU/L   ALT 9 0 - 32 IU/L  CBC with Differential/Platelet  Result Value Ref Range   WBC 4.4 3.4 - 10.8 x10E3/uL   RBC 4.11 3.77 - 5.28 x10E6/uL   Hemoglobin 11.9 11.1 - 15.9 g/dL   Hematocrit 36.7 34.0 - 46.6 %   MCV 89 79 - 97 fL   MCH 29.0 26.6 - 33.0 pg   MCHC 32.4 31.5 - 35.7 g/dL   RDW 13.0 11.7 - 15.4 %   Platelets 215 150 - 450 x10E3/uL   Neutrophils 54 Not Estab. %   Lymphs 33 Not Estab. %   Monocytes 9 Not Estab. %   Eos 3 Not Estab. %   Basos 1 Not Estab. %   Neutrophils Absolute 2.4 1.4 - 7.0 x10E3/uL   Lymphocytes Absolute 1.4 0.7 - 3.1 x10E3/uL    Monocytes Absolute 0.4 0.1 - 0.9 x10E3/uL   EOS (ABSOLUTE) 0.2 0.0 - 0.4 x10E3/uL   Basophils Absolute 0.0 0.0 - 0.2 x10E3/uL   Immature Granulocytes 0 Not Estab. %   Immature Grans (Abs) 0.0 0.0 - 0.1 x10E3/uL  Vitamin B12  Result Value Ref Range   Vitamin B-12 >2000 (H) 232 - 1245 pg/mL  VITAMIN D 25 Hydroxy (Vit-D Deficiency, Fractures)  Result Value Ref Range   Vit D, 25-Hydroxy 53.5 30.0 - 100.0 ng/mL  Thyroid Panel With TSH  Result Value Ref Range   TSH 0.493 0.450 - 4.500 uIU/mL   T4, Total 9.7 4.5 - 12.0 ug/dL   T3 Uptake Ratio 26 24 - 39 %   Free Thyroxine Index 2.5 1.2 - 4.9  Lipid panel  Result Value Ref Range   Cholesterol, Total 155 100 - 199 mg/dL   Triglycerides 101 0 - 149 mg/dL   HDL 66 >39 mg/dL   VLDL Cholesterol Cal 20 5 - 40 mg/dL   LDL Calculated 69 0 - 99 mg/dL   Chol/HDL Ratio 2.3 0.0 - 4.4 ratio    Assessment & Plan:   Problem List Items Addressed This Visit      Cardiovascular and Mediastinum   Essential hypertension   Relevant Orders   Referral to Chronic Care Management Services     Digestive   GERD   Relevant Medications   linaclotide (LINZESS) 72 MCG capsule   Other Relevant Orders   Referral  to Chronic Care Management Services     Other   Depression, recurrent [facility])   Relevant Orders   Referral to Chronic Care Management Services   GAD (generalized anxiety disorder)   Relevant Orders   Referral to Chronic Care Management Services   Pure hypercholesterolemia - Primary   Relevant Orders   Referral to Chronic Care Management Services    Other Visit Diagnoses    Irritable bowel syndrome with constipation       Relevant Medications   linaclotide (LINZESS) 72 MCG capsule   Other Relevant Orders   Referral to Chronic Care Management Services      Referral to [facility] for chronic care management  We will try and get Linzess in an affordable way Follow up plan: Return in about 4 months (around 08/05/2019), or if symptoms worsen or  fail to improve, for recheck anxiety and depression.  Counseling provided for all of the vaccine components Orders Placed This Encounter  Procedures  . Referral to Chronic Care Management Services    Caryl Pina, MD [facility] 04/11/2019, 11:24 AM

## 2019-04-12 ENCOUNTER — Ambulatory Visit: Payer: Self-pay | Admitting: Licensed Clinical Social Worker

## 2019-04-12 DIAGNOSIS — E538 Deficiency of other specified B group vitamins: Secondary | ICD-10-CM

## 2019-04-12 DIAGNOSIS — F339 Major depressive disorder, recurrent, unspecified: Secondary | ICD-10-CM

## 2019-04-12 DIAGNOSIS — I10 Essential (primary) hypertension: Secondary | ICD-10-CM

## 2019-04-12 DIAGNOSIS — C73 Malignant neoplasm of thyroid gland: Secondary | ICD-10-CM

## 2019-04-12 DIAGNOSIS — Z930 Tracheostomy status: Secondary | ICD-10-CM

## 2019-04-12 DIAGNOSIS — K219 Gastro-esophageal reflux disease without esophagitis: Secondary | ICD-10-CM

## 2019-04-12 DIAGNOSIS — F411 Generalized anxiety disorder: Secondary | ICD-10-CM

## 2019-04-12 NOTE — Patient Instructions (Signed)
Licensed Clinical Social Worker Visit Information  Goals we discussed today:  Goals Addressed            This Visit's Progress   . Client will talk  with [name] in next 30 days about managing anxiety issues for client (pt-stated)       Current Barriers:  . Client fatigues easily . Client has tracheostomy  Clinical Social Work Clinical Goal(s):  . Client and [name] to communicate in next 30 days to discuss client management of anxiety issues of client  Interventions: . [name] talked with [name] about nursing support with RNCM . [name] talked with [name] about [name] support . [name] talked with [name] about her in home care needs of client  . Talked with [name] about ambulation needs (she uses a cane to help her  walk) . Talked with client about meal provision for client . Talked with client about relaxation techniques of choice (she likes to watch TV, or to sit on porch, talk on the phone with family members, likes to go out to eat)  Patient Self Care Activities:  . Takes medications as prescribed . Attends scheduled medical appointments  Plan:   Client to attend scheduled medical appointments [name] to call client in next 3 weeks to talk with her about anxiety symptoms management of client Client to call RNCM as needed to discuss nursing needs of client Client to use relaxation techniques of choice to manage anxiety or stress symptoms  Initial goal documentation         Materials Provided: No  Follow Up Plan: [name] to call client in next 3 weeks talk with her about anxiety symptoms management for client  The patient verbalized understanding of instructions provided today and declined a print copy of patient instruction materials.   [name].[name] MSW, [name] Licensed Clinical Social Worker [facility] [phone]

## 2019-04-12 NOTE — Chronic Care Management (AMB) (Signed)
Care Management Note   [name] is a 78 y.o. year old female who is a primary care patient of [name], [name], MD. The CM team was consulted for assistance with chronic disease management and care coordination.   I reached out to [name] by phone today.   Review of patient status, including review of consultants reports, relevant laboratory and other test results, and collaboration with appropriate care team members and the patient's provider was performed as part of comprehensive patient evaluation and provision of chronic care management services.   Social Determinants of Health:  Risk of social isolation; risk of physical activity    Chronic Care Management from 04/12/2019 in [place]  PHQ-9 Total Score  5     GAD 7 : Generalized Anxiety Score 04/12/2019  Nervous, Anxious, on Edge 1  Control/stop worrying 1  Worry too much - different things 1  Trouble relaxing 1  Restless 0  Easily annoyed or irritable 0  Afraid - awful might happen 0  Total GAD 7 Score 4  Anxiety Difficulty Somewhat difficult   Medications   Outpatient Medications   aspirin 81 MG chewable tablet    calcium carbonate (OS-CAL - DOSED IN MG OF ELEMENTAL CALCIUM) 1250 (500 Ca) MG tablet    clonazePAM (KLONOPIN) 0.25 MG disintegrating tablet    furosemide (LASIX) 20 MG tablet    ketoconazole (NIZORAL) 2 % cream    latanoprost (XALATAN) 0.005 % ophthalmic solution    levothyroxine (SYNTHROID, LEVOTHROID) 100 MCG tablet    levothyroxine (SYNTHROID, LEVOTHROID) 112 MCG tablet    linaclotide (LINZESS) 72 MCG capsule    loratadine (CLARITIN) 10 MG tablet    magnesium oxide (MAG-OX) 400 (241.3 Mg) MG tablet    Multiple Vitamin (MULTIVITAMIN WITH MINERALS) TABS tablet    potassium chloride (K-DUR) 10 MEQ tablet    QUEtiapine (SEROQUEL) 25 MG tablet    simvastatin (ZOCOR) 80 MG tablet    venlafaxine (EFFEXOR) 37.5 MG tablet   Clinic-Administered Medications   cyanocobalamin ((VITAMIN B-12)) injection 1,000 mcg      Goals Addressed            This Visit's Progress   . Client will talk  with [name] in next 30 days about managing anxiety issues for client (pt-stated)       Current Barriers:  . Client fatigues easily . Client has tracheostomy  Clinical Social Work Clinical Goal(s):  . Client and [name] to communicate in next 30 days to discuss client management of anxiety issues of client  Interventions: . [name] talked with [name] about nursing support with RNCM . [name] talked with [name] about [name] support . [name] talked with [name] about her in home care needs of client  . Talked with [name] about ambulation needs (she uses a cane to help her  walk) . Talked with client about meal provision for client . Talked with client about relaxation techniques of choice (she likes to watch TV, or to sit on porch, talk on the phone with family members, likes to go out to eat)  Patient Self Care Activities:  . Takes medications as prescribed . Attends scheduled medical appointments  Plan:   Client to attend scheduled medical appointments [name] to call client in next 3 weeks to talk with her about anxiety symptoms management of client Client to call RNCM as needed to discuss nursing needs of client Client to use relaxation techniques of choice to manage anxiety or stress symptoms  Initial goal documentation       Follow Up Plan: [name] to call client in next 3 weeks to talk with client about anxiety symptoms management for client  [name].Myalynn Lingle MSW, [name] Licensed Clinical Social Worker [facility] [phone]

## 2019-04-17 ENCOUNTER — Ambulatory Visit: Payer: Medicare Other | Admitting: Family Medicine

## 2019-04-17 ENCOUNTER — Ambulatory Visit (INDEPENDENT_AMBULATORY_CARE_PROVIDER_SITE_OTHER): Payer: Medicare Other | Admitting: *Deleted

## 2019-04-17 DIAGNOSIS — Z0000 Encounter for general adult medical examination without abnormal findings: Secondary | ICD-10-CM | POA: Diagnosis not present

## 2019-04-17 NOTE — Progress Notes (Signed)
MEDICARE ANNUAL WELLNESS VISIT  04/17/2019  Telephone Visit Disclaimer This Medicare AWV was conducted by telephone due to national recommendations for restrictions regarding the COVID-19 Pandemic (e.g. social distancing).  I verified, using two identifiers, that I am speaking with [name] or their authorized healthcare agent. I discussed the limitations, risks, security, and privacy concerns of performing an evaluation and management service by telephone and the potential availability of an in-person appointment in the future. The patient expressed understanding and agreed to proceed.   Subjective:  [name] is a 78 y.o. female patient of [name], [name], MD who had a Medicare Annual Wellness Visit today via telephone. 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 she has 1 child. she reports that she is socially active and does interact with friends/family regularly. she is minimally physically active and enjoys watching TV and sitting on her porch watching the birds.  Patient Care Team: [name], [name], MD as PCP - General (Family Medicine) Steffanie Rainwater, DPM as Consulting Physician (Podiatry) Minus Breeding, MD as Consulting Physician (Cardiology) Netta Cedars, MD as Consulting Physician (Orthopedic Surgery) Clarene Essex, MD as Consulting Physician (Gastroenterology) Irine Seal, MD as Attending Physician (Urology) Latanya Maudlin, MD as Consulting Physician (Orthopedic Surgery) Harlen Labs, MD as Referring Physician (Optometry) [name], Collier Salina, MD as Consulting Physician (Cardiothoracic Surgery) Melida Quitter, MD as Consulting Physician (Otolaryngology) Shea Evans [name], [name] as Social Worker (Licensed Clinical Social Worker)  Advanced Directives 04/17/2019 04/12/2018 01/18/2018 12/27/2017 11/28/2017 08/12/2017 08/05/2017  Does Patient Have a Medical Advance Directive? No No No No No No No  Type of Advance Directive - - - - - - -  Does patient want to make  changes to medical advance directive? - - - - - - -  Copy of [place] in Chart? - - - - - - -  Would patient like information on creating a medical advance directive? No - Patient declined No - Patient declined No - Patient declined No - Patient declined No - Patient declined No - Patient declined No - Patient declined  Pre-existing out of facility DNR order (yellow form or pink MOST form) - - - - - - -    Hospital Utilization Over the Past 12 Months: # of hospitalizations or ER visits: 1 # of surgeries: 2  Review of Systems    Patient reports that her overall health is better compared to last year.  History obtained from chart review  Patient Reported Readings (BP, Pulse, CBG, Weight, etc) none  Pain Assessment Pain : No/denies pain     Current Medications & Allergies (verified) Allergies as of 04/17/2019      Reactions   Actonel [risedronate] Other (See Comments)   Bone pain and nausea   Lipitor [atorvastatin Calcium] Other (See Comments)   myalgias   Lyrica [pregabalin] Other (See Comments)   SORES IN MOUTH    Sibutramine Hcl Monohydrate Other (See Comments)   UNSPECIFIED REACTION    Alendronate Sodium Nausea Only   Latex Rash, Other (See Comments)   RA to latex 1982; includes bandaids    Levofloxacin Nausea And Vomiting   Meloxicam Other (See Comments)   Vaginal itching       Medication List       Accurate as of April 17, 2019 10:42 AM. If you have any questions, ask your nurse or doctor.        aspirin 81 MG chewable tablet Chew 81  mg by mouth at bedtime.   calcium carbonate 1250 (500 Ca) MG tablet Commonly known as: OS-CAL - dosed in mg of elemental calcium Take 1 tablet (500 mg of elemental calcium total) by mouth daily with breakfast.   clonazePAM 0.25 MG disintegrating tablet Commonly known as: KLONOPIN DISSOLVE 1 TABLET BY MOUTH 3 TIMES A DAY   furosemide 20 MG tablet Commonly known as: LASIX Take 10 mg by mouth daily. 10  mg po  Mon/Wed/Fridays   ketoconazole 2 % cream Commonly known as: NIZORAL Apply topically daily.   latanoprost 0.005 % ophthalmic solution Commonly known as: XALATAN Place 1 drop into both eyes at bedtime.   levothyroxine 112 MCG tablet Commonly known as: SYNTHROID Take 112 mcg by mouth every other day. Alternates doses   levothyroxine 100 MCG tablet Commonly known as: SYNTHROID Take 100 mcg by mouth See admin instructions. Take one tablet by mouth every other day (alternates doses)   linaclotide 72 MCG capsule Commonly known as: Linzess Take 1 capsule (72 mcg total) by mouth daily before breakfast.   loratadine 10 MG tablet Commonly known as: CLARITIN TAKE 1 TABLET DAILY   magnesium oxide 400 (241.3 Mg) MG tablet Commonly known as: MAG-OX Take 1 tablet (400 mg total) by mouth daily. What changed: when to take this   multivitamin with minerals Tabs tablet Take 1 tablet by mouth daily.   potassium chloride 10 MEQ tablet Commonly known as: KLOR-CON TAKE 2 TABLETS TWICE A DAY   QUEtiapine 25 MG tablet Commonly known as: SEROQUEL TAKE 3 TABLETS AT BEDTIME AS DIRECTED   simvastatin 80 MG tablet Commonly known as: ZOCOR Take 1 tablet (80 mg total) by mouth daily.   venlafaxine 37.5 MG tablet Commonly known as: EFFEXOR Take 1 tablet (37.5 mg total) by mouth 2 (two) times daily.       History (reviewed): Past Medical History:  Diagnosis Date  . ACL tear    right  Dr. Gladstone Pih   . Adenomatous polyp   . Anxiety   . Arthritis    "all over" (04/12/2018)  . Ascending aortic aneurysm ([place])    note per chart per Dr Lucianne Lei Tright 4.7 cm 04/15/2015   . Asthma   . B12 deficiency   . Chronic bronchitis ([place])   . Depression   . DUB (dysfunctional uterine bleeding) 10/96  . Fibromyalgia   . GERD (gastroesophageal reflux disease)   . Glaucoma    bilaterally  . Helicobacter pylori (H. pylori)   . Hiatal hernia   . History of blood transfusion 06/2017   "related to  [facility]"  . History of kidney stones   . History of left shoulder fracture    pt states fell off bed and broke ball in shoulder had rod placed   . Hyperlipidemia   . Hypertension   . Hypothyroidism   . Inspiratory stridor   . Laryngeal stenosis   . Occasional tremors    left arm   . Osteoarthritis   . Osteopenia   . Osteoporosis   . Thyroid cancer ([place]) 2017  . Thyroid nodule   . Tracheostomy in place [facility])    Past Surgical History:  Procedure Laterality Date  . AORTIC VALVE REPLACEMENT N/A 07/10/2017   Procedure: AORTIC VALVE REPLACEMENT (AVR);  Surgeon: [name], Collier Salina, MD;  Location: [place];  Service: Open Heart Surgery;  Laterality: N/A;  . APPENDECTOMY    . BUNIONECTOMY  08/1999   right - Dr. Irving Shows   . CARDIAC VALVE  REPLACEMENT    . CATARACT EXTRACTION, BILATERAL Bilateral 05/28/07-2009   "right-left"  . CHOLECYSTECTOMY OPEN  1979  . DILATION AND CURETTAGE OF UTERUS  03/31/95   Dr. Ovid Curd   . DIRECT LARYNGOSCOPY WITH BOTOX INJECTION N/A 12/01/2017   Procedure: SUSPENDED DIRECT MICROLARYNGOSCOPY WITH KENALOG INJECTION, CO2 LASER;  Surgeon: Melida Quitter, MD;  Location: [place];  Service: ENT;  Laterality: N/A;  . EXCISIONAL HEMORRHOIDECTOMY    . EXPLORATION POST OPERATIVE OPEN HEART N/A 07/11/2017   Procedure: EXPLORATION POST OPERATIVE OPEN HEART;  Surgeon: Ivin Poot, MD;  Location: [place];  Service: Open Heart Surgery;  Laterality: N/A;  . EYE SURGERY     also had left cataract removed   . FRACTURE SURGERY    . HEMATOMA EVACUATION N/A 07/11/2017   Procedure: EVACUATION HEMATOMA;  Surgeon: Ivin Poot, MD;  Location: [place];  Service: Open Heart Surgery;  Laterality: N/A;  . HERNIA REPAIR    . JOINT REPLACEMENT    . MICROLARYNGOSCOPY WITH LASER AND BALLOON DILATION N/A 01/24/2018   Procedure: MICROLARYNGOSCOPY WITH LASER AND BALLOON DILATION;  Surgeon: Melida Quitter, MD;  Location: [place];  Service: ENT;  Laterality: N/A;  . MICROLARYNGOSCOPY WITH LASER AND BALLOON  DILATION N/A 04/13/2018   Procedure: MICROLARYNGOSCOPY WITH BALLOON DILATION;  Surgeon: Melida Quitter, MD;  [facility];  Service: ENT;  Laterality: N/A;  . NEPHROLITHOTOMY Left 04/07/2017   Procedure: NEPHROLITHOTOMY PERCUTANEOUS WITH SURGEON ACCESS;  Surgeon: Ardis Hughs, MD;  Location: [facility];  Service: Urology;  Laterality: Left;  . ORIF HUMERUS FRACTURE  05/30/2011   Procedure: OPEN REDUCTION INTERNAL FIXATION (ORIF) PROXIMAL HUMERUS FRACTURE;  Surgeon: Augustin Schooling;  Location: [place];  Service: Orthopedics;  Laterality: Left;  open reduction internal fixation of proximal humerus fracture  . PLACEMENT OF CENTRIMAG VENTRICULAR ASSIST DEVICE Right 07/11/2017   Procedure: PLACEMENT OF CENTRIMAG VENTRICULAR ASSIST DEVICE;  Surgeon: Ivin Poot, MD;  Location: [place];  Service: Open Heart Surgery;  Laterality: Right;  . REMOVAL OF CENTRIMAG VENTRICULAR ASSIST DEVICE N/A 07/17/2017   Procedure: REMOVAL OF RVAD WITH PUMP STANDBY;  Surgeon: Ivin Poot, MD;  Location: [place];  Service: Open Heart Surgery;  Laterality: N/A;  . REPLACEMENT ASCENDING AORTA N/A 07/10/2017   Procedure: REPLACEMENT ASCENDING AORTA;  Surgeon: Ivin Poot, MD;  Location: [place];  Service: Open Heart Surgery;  Laterality: N/A;  . RIGHT/LEFT HEART CATH AND CORONARY ANGIOGRAPHY N/A 06/13/2017   Procedure: RIGHT/LEFT HEART CATH AND CORONARY ANGIOGRAPHY;  Surgeon: Jolaine Artist, MD;  Location: [place] [facility];  Service: Cardiovascular;  Laterality: N/A;  . TEE WITHOUT CARDIOVERSION N/A 07/10/2017   Procedure: TRANSESOPHAGEAL ECHOCARDIOGRAM (TEE);  Surgeon: [name], Collier Salina, MD;  Location: [place];  Service: Open Heart Surgery;  Laterality: N/A;  . TEE WITHOUT CARDIOVERSION  07/11/2017   Procedure: TRANSESOPHAGEAL ECHOCARDIOGRAM (TEE);  Surgeon: [name], Collier Salina, MD;  Location: [place];  Service: Open Heart Surgery;;  . TEE WITHOUT CARDIOVERSION N/A 07/17/2017   Procedure: TRANSESOPHAGEAL ECHOCARDIOGRAM  (TEE);  Surgeon: [name], Collier Salina, MD;  Location: [place];  Service: Open Heart Surgery;  Laterality: N/A;  . THYROID LOBECTOMY Left 07/27/2015   Procedure: LEFT THYROID LOBECTOMY;  Surgeon: Fanny Skates, MD;  [facility];  Service: General;  Laterality: Left;  . THYROIDECTOMY N/A 08/06/2015   Procedure: RIGHT THYROID LOBECTOMY, REIMPLANTATION PARATHYROID;  Surgeon: Fanny Skates, MD;  [facility];  Service: General;  Laterality: N/A;  . TOTAL KNEE  ARTHROPLASTY Right 08/2009   Dr. Gladstone Pih  . TRACHEOSTOMY  08/08/2017  . VENTRAL HERNIA REPAIR  2/99   Family History  Problem Relation Age of Onset  . Cancer Mother        PANCREATIC   . Osteoporosis Mother   . Hip fracture Mother   . Heart attack Father 63       Died 68  . Heart disease Father   . Arthritis Father   . Hypertension Sister   . Cancer Sister        skin  . Hypertension Sister   . Cancer Sister        breast cancer  . Breast cancer Neg Hx    Social History   Socioeconomic History  . Marital status: Married    Spouse name: Laverna Peace  . Number of children: 1  . Years of education: 1  . Highest education level: 11th grade  Occupational History  . Occupation: homemaker  Social Needs  . Financial resource strain: Not hard at all  . Food insecurity    Worry: Never true    Inability: Never true  . Transportation needs    Medical: No    Non-medical: No  Tobacco Use  . Smoking status: Never Smoker  . Smokeless tobacco: Never Used  Substance and Sexual Activity  . Alcohol use: No  . Drug use: Never  . Sexual activity: Not Currently    Birth control/protection: Post-menopausal  Lifestyle  . Physical activity    Days per week: 4 days    Minutes per session: 10 min  . Stress: Only a little  Relationships  . Social connections    Talks on phone: More than three times a week    Gets together: More than three times a week    Attends religious service: Never    Active member of club or organization: No     Attends meetings of clubs or organizations: Never    Relationship status: Married  Other Topics Concern  . Not on file  Social History Narrative   Lives with husband.     Activities of Daily Living In your present state of health, do you have any difficulty performing the following activities: 04/17/2019  Hearing? N  Vision? N  Comment gets yearly eye exam  Difficulty concentrating or making decisions? N  Walking or climbing stairs? N  Comment she does use a cane at all times when walking  Dressing or bathing? N  Doing errands, shopping? Y  Comment her husband always drives her to appointments and to do errands, pt states "I don't trust myself drivingChief Executive Officer and eating ? N  Using the Toilet? N  In the past six months, have you accidently leaked urine? Y  Comment only when she gets up at night to go to the bathroom-5 times in the past 6 months  Do you have problems with loss of bowel control? N  Managing your Medications? Y  Comment her husband fixes her medications for her every morning and every night  Managing your Finances? Y  Comment husband manages all the finances  Housekeeping or managing your Housekeeping? Y  Comment someone comes in once a week to clean but pt does chores such as laundry during the week  Some recent data might be hidden    Patient Education/ Literacy How often do you need to have someone help you when you read instructions, pamphlets, or other written materials from your doctor or pharmacy?: 1 -  Never What is the last grade level you completed in school?: 11th grade  Exercise Current Exercise Habits: Home exercise routine, Type of exercise: walking, Time (Minutes): 10, Frequency (Times/Week): 4, Weekly Exercise (Minutes/Week): 40, Intensity: Mild, Exercise limited by: cardiac condition(s)  Diet Patient reports consuming 2 meals a day and 3 snack(s) a day Patient reports that her primary diet is: Regular Patient reports that she does have  regular access to food.   Depression Screen PHQ 2/9 Scores 04/17/2019 04/12/2019 04/11/2019 01/18/2019 08/02/2018 05/02/2018 03/12/2018  PHQ - 2 Score 0 2 0 0 0 1 2  PHQ- 9 Score - 5 - 0 - - 4     Fall Risk Fall Risk  04/17/2019 04/11/2019 01/18/2019 08/02/2018 05/29/2018  Falls in the past year? 0 0 0 0 1  Number falls in past yr: 0 - - - 0  Injury with Fall? 0 - - - 1  Comment - - - - bruising, lacerations, skin tears  Risk Factor Category  - - - - -  Risk for fall due to : Impaired balance/gait - - - -  Risk for fall due to: Comment uses a cane to walk - - - -  Follow up Falls prevention discussed - - - -  Comment Get rid of all throw rugs in the house, adequate lighting in the walkways and grab bars in the bathroom - - - -     Objective:  [name] seemed alert and oriented and she participated appropriately during our telephone visit.  Blood Pressure Weight BMI  BP Readings from Last 3 Encounters:  04/11/19 113/67  01/18/19 122/72  10/10/18 120/80   Wt Readings from Last 3 Encounters:  04/11/19 174 lb 12.8 oz (79.3 kg)  01/18/19 174 lb (78.9 kg)  10/10/18 160 lb (72.6 kg)   BMI Readings from Last 1 Encounters:  04/11/19 29.09 kg/m    *Unable to obtain current vital signs, weight, and BMI due to telephone visit type  Hearing/Vision  . [name] did not seem to have difficulty with hearing/understanding during the telephone conversation . Reports that she has had a formal eye exam by an eye care professional within the past year . Reports that she has not had a formal hearing evaluation within the past year *Unable to fully assess hearing and vision during telephone visit type  Cognitive Function: 6CIT Screen 04/17/2019  What Year? 0 points  What month? 0 points  What time? 0 points  Count back from 20 0 points  Months in reverse 2 points  Repeat phrase 4 points  Total Score 6   (Normal:0-7, Significant for Dysfunction: >8)  Normal Cognitive Function Screening: Yes    Immunization & Health Maintenance Record Immunization History  Administered Date(s) Administered  . Fluad Quad(high Dose 65+) 04/01/2019  . Influenza, High Dose Seasonal PF 04/04/2018  . Influenza,inj,Quad PF,6+ Mos 03/27/2013, 04/01/2014, 03/31/2015, 03/22/2016  . Pneumococcal Conjugate-13 11/15/2013  . Pneumococcal Polysaccharide-23 09/06/2007  . Td 01/17/2014    Health Maintenance  Topic Date Due  . TETANUS/TDAP  01/18/2024  . INFLUENZA VACCINE  Completed  . DEXA SCAN  Completed  . PNA vac Low Risk Adult  Completed       Assessment  This is a routine wellness examination for BENA QUIRAM.  Health Maintenance: Due or Overdue There are no preventive care reminders to display for this patient.  WENDALYN WAKEHAM does not need a referral for Community Assistance: Care Management:   no Social Work:  no Prescription Assistance:  no Nutrition/Diabetes Education:  no   Plan:  Personalized Goals Goals Addressed            This Visit's Progress   . DIET - INCREASE WATER INTAKE       Try to drink 6-8 glasses of water daily.      Personalized Health Maintenance & Screening Recommendations  Shingrix series  Lung Cancer Screening Recommended: no (Low Dose CT Chest recommended if Age 32-80 years, 30 pack-year currently smoking OR have quit w/in past 15 years) Hepatitis C Screening recommended: no HIV Screening recommended: no  Advanced Directives: Written information was not prepared per patient's request.  Referrals & Orders No orders of the defined types were placed in this encounter.   Follow-up Plan . Follow-up with [name], [name], MD as planned . Consider Shingrix series at your next visit with your PCP   I have personally reviewed and noted the following in the patient's chart:   . Medical and social history . Use of alcohol, tobacco or illicit drugs  . Current medications and supplements . Functional ability and status . Nutritional status .  Physical activity . Advanced directives . List of other physicians . Hospitalizations, surgeries, and ER visits in previous 12 months . Vitals . Screenings to include cognitive, depression, and falls . Referrals and appointments  In addition, I have reviewed and discussed with [name] certain preventive protocols, quality metrics, and best practice recommendations. A written personalized care plan for preventive services as well as general preventive health recommendations is available and can be mailed to the patient at her request.      Milas Hock, LPN  [phone]

## 2019-04-17 NOTE — Patient Instructions (Signed)
Preventive Care 76 Years and Older, Female Preventive care refers to lifestyle choices and visits with your health care provider that can promote health and wellness. This includes:  A yearly physical exam. This is also called an annual well check.  Regular dental and eye exams.  Immunizations.  Screening for certain conditions.  Healthy lifestyle choices, such as diet and exercise. What can I expect for my preventive care visit? Physical exam Your health care provider will check:  Height and weight. These may be used to calculate body mass index (BMI), which is a measurement that tells if you are at a healthy weight.  Heart rate and blood pressure.  Your skin for abnormal spots. Counseling Your health care provider may ask you questions about:  Alcohol, tobacco, and drug use.  Emotional well-being.  Home and relationship well-being.  Sexual activity.  Eating habits.  History of falls.  Memory and ability to understand (cognition).  Work and work Statistician.  Pregnancy and menstrual history. What immunizations do I need?  Influenza (flu) vaccine  This is recommended every year. Tetanus, diphtheria, and pertussis (Tdap) vaccine  You may need a Td booster every 10 years. Varicella (chickenpox) vaccine  You may need this vaccine if you have not already been vaccinated. Zoster (shingles) vaccine  You may need this after age 33. Pneumococcal conjugate (PCV13) vaccine  One dose is recommended after age 33. Pneumococcal polysaccharide (PPSV23) vaccine  One dose is recommended after age 72. Measles, mumps, and rubella (MMR) vaccine  You may need at least one dose of MMR if you were born in 1957 or later. You may also need a second dose. Meningococcal conjugate (MenACWY) vaccine  You may need this if you have certain conditions. Hepatitis A vaccine  You may need this if you have certain conditions or if you travel or work in places where you may be exposed  to hepatitis A. Hepatitis B vaccine  You may need this if you have certain conditions or if you travel or work in places where you may be exposed to hepatitis B. Haemophilus influenzae type b (Hib) vaccine  You may need this if you have certain conditions. You may receive vaccines as individual doses or as more than one vaccine together in one shot (combination vaccines). Talk with your health care provider about the risks and benefits of combination vaccines. What tests do I need? Blood tests  Lipid and cholesterol levels. These may be checked every 5 years, or more frequently depending on your overall health.  Hepatitis C test.  Hepatitis B test. Screening  Lung cancer screening. You may have this screening every year starting at age 39 if you have a 30-pack-year history of smoking and currently smoke or have quit within the past 15 years.  Colorectal cancer screening. All adults should have this screening starting at age 36 and continuing until age 15. Your health care provider may recommend screening at age 23 if you are at increased risk. You will have tests every 1-10 years, depending on your results and the type of screening test.  Diabetes screening. This is done by checking your blood sugar (glucose) after you have not eaten for a while (fasting). You may have this done every 1-3 years.  Mammogram. This may be done every 1-2 years. Talk with your health care provider about how often you should have regular mammograms.  BRCA-related cancer screening. This may be done if you have a family history of breast, ovarian, tubal, or peritoneal cancers.  Other tests  Sexually transmitted disease (STD) testing.  Bone density scan. This is done to screen for osteoporosis. You may have this done starting at age 55. Follow these instructions at home: Eating and drinking  Eat a diet that includes fresh fruits and vegetables, whole grains, lean protein, and low-fat dairy products. Limit  your intake of foods with high amounts of sugar, saturated fats, and salt.  Take vitamin and mineral supplements as recommended by your health care provider.  Do not drink alcohol if your health care provider tells you not to drink.  If you drink alcohol: ? Limit how much you have to 0-1 drink a day. ? Be aware of how much alcohol is in your drink. In the [place] one drink equals one 12 oz bottle of beer (355 mL), one 5 oz glass of wine (148 mL), or one 1 oz glass of hard liquor (44 mL). Lifestyle  Take daily care of your teeth and gums.  Stay active. Exercise for at least 30 minutes on 5 or more days each week.  Do not use any products that contain nicotine or tobacco, such as cigarettes, e-cigarettes, and chewing tobacco. If you need help quitting, ask your health care provider.  If you are sexually active, practice safe sex. Use a condom or other form of protection in order to prevent STIs (sexually transmitted infections).  Talk with your health care provider about taking a low-dose aspirin or statin. What's next?  Go to your health care provider once a year for a well check visit.  Ask your health care provider how often you should have your eyes and teeth checked.  Stay up to date on all vaccines. This information is not intended to replace advice given to you by your health care provider. Make sure you discuss any questions you have with your health care provider. Document Released: 07/10/2015 Document Revised: 06/07/2018 Document Reviewed: 06/07/2018 Elsevier Patient Education  2020 [employer].

## 2019-04-18 ENCOUNTER — Other Ambulatory Visit: Payer: Self-pay | Admitting: Family Medicine

## 2019-04-18 ENCOUNTER — Ambulatory Visit: Payer: Medicare Other | Admitting: Family Medicine

## 2019-04-23 ENCOUNTER — Telehealth: Payer: Self-pay | Admitting: Family Medicine

## 2019-04-23 DIAGNOSIS — Z930 Tracheostomy status: Secondary | ICD-10-CM

## 2019-04-23 NOTE — Telephone Encounter (Signed)
This can go to [name]

## 2019-04-23 NOTE — Telephone Encounter (Signed)
Please advise on request for trach cover.

## 2019-04-24 NOTE — Telephone Encounter (Signed)
I have placed the order for the trach shower cover and have printed it, please let them know they can come get it, or we can fax it for them

## 2019-04-24 NOTE — Telephone Encounter (Signed)
Attempted to contact - NA. Rx placed up front to be picked up.

## 2019-04-25 DIAGNOSIS — C73 Malignant neoplasm of thyroid gland: Secondary | ICD-10-CM | POA: Diagnosis not present

## 2019-04-25 DIAGNOSIS — E890 Postprocedural hypothyroidism: Secondary | ICD-10-CM | POA: Diagnosis not present

## 2019-04-30 ENCOUNTER — Other Ambulatory Visit: Payer: Self-pay | Admitting: Family Medicine

## 2019-04-30 DIAGNOSIS — R05 Cough: Secondary | ICD-10-CM

## 2019-04-30 DIAGNOSIS — E890 Postprocedural hypothyroidism: Secondary | ICD-10-CM | POA: Diagnosis not present

## 2019-04-30 DIAGNOSIS — R059 Cough, unspecified: Secondary | ICD-10-CM

## 2019-04-30 DIAGNOSIS — Z85850 Personal history of malignant neoplasm of thyroid: Secondary | ICD-10-CM | POA: Diagnosis not present

## 2019-05-03 ENCOUNTER — Ambulatory Visit: Payer: Self-pay | Admitting: Licensed Clinical Social Worker

## 2019-05-03 DIAGNOSIS — C73 Malignant neoplasm of thyroid gland: Secondary | ICD-10-CM

## 2019-05-03 DIAGNOSIS — I10 Essential (primary) hypertension: Secondary | ICD-10-CM

## 2019-05-03 DIAGNOSIS — E7800 Pure hypercholesterolemia, unspecified: Secondary | ICD-10-CM

## 2019-05-03 DIAGNOSIS — E538 Deficiency of other specified B group vitamins: Secondary | ICD-10-CM

## 2019-05-03 DIAGNOSIS — K219 Gastro-esophageal reflux disease without esophagitis: Secondary | ICD-10-CM

## 2019-05-03 DIAGNOSIS — F411 Generalized anxiety disorder: Secondary | ICD-10-CM

## 2019-05-03 DIAGNOSIS — F339 Major depressive disorder, recurrent, unspecified: Secondary | ICD-10-CM

## 2019-05-03 NOTE — Chronic Care Management (AMB) (Signed)
Care Management Note   [name] is a 78 y.o. year old female who is a primary care patient of [name], [name], MD. The CM team was consulted for assistance with chronic disease management and care coordination.   I reached out to [name] by phone today.    Review of patient status, including review of consultants reports, relevant laboratory and other test results, and collaboration with appropriate care team members and the patient's provider was performed as part of comprehensive patient evaluation and provision of chronic care management services.   Social determinants of health: risk of social isolation; risk of physical inactivity    Chronic Care Management from 04/12/2019 in [place]  PHQ-9 Total Score  5     GAD 7 : Generalized Anxiety Score 04/12/2019  Nervous, Anxious, on Edge 1  Control/stop worrying 1  Worry too much - different things 1  Trouble relaxing 1  Restless 0  Easily annoyed or irritable 0  Afraid - awful might happen 0  Total GAD 7 Score 4  Anxiety Difficulty Somewhat difficult   Medications   New medications from outside sources are available for reconciliation  Outpatient Medications   aspirin 81 MG chewable tablet    benzonatate (TESSALON) 100 MG capsule    calcium carbonate (OS-CAL - DOSED IN MG OF ELEMENTAL CALCIUM) 1250 (500 Ca) MG tablet    clonazePAM (KLONOPIN) 0.25 MG disintegrating tablet    furosemide (LASIX) 20 MG tablet    ketoconazole (NIZORAL) 2 % cream    latanoprost (XALATAN) 0.005 % ophthalmic solution    levothyroxine (SYNTHROID, LEVOTHROID) 100 MCG tablet    levothyroxine (SYNTHROID, LEVOTHROID) 112 MCG tablet    linaclotide (LINZESS) 72 MCG capsule    loratadine (CLARITIN) 10 MG tablet    magnesium oxide (MAG-OX) 400 (241.3 Mg) MG tablet    Multiple Vitamin (MULTIVITAMIN WITH MINERALS) TABS tablet    potassium chloride (KLOR-CON) 10 MEQ tablet    QUEtiapine (SEROQUEL) 25 MG tablet    simvastatin (ZOCOR) 80 MG tablet    venlafaxine (EFFEXOR) 37.5 MG tablet   Clinic-Administered Medications   cyanocobalamin ((VITAMIN B-12)) injection 1,000 mcg     Goals    . Client will talk  with [name] in next 30 days about managing anxiety issues for client (pt-stated)     Current Barriers:  . Client fatigues easily . Client has tracheostomy .   Clinical Social Work Clinical Goal(s):  . Client and [name] to communicate in next 30 days to discuss client management of anxiety issues of client  Interventions: . [name] talked previously with [name] about nursing support with RNCM . [name] talked previously with [name] about her in home care needs of client  . Previously talked with [name] about ambulation needs (she uses a cane to help her  walk) . Previously talked with client about relaxation techniques of choice (she likes to watch TV, or to sit on porch, talk on the phone with family members, likes to go out to eat)  Patient Self Care Activities:  . Takes medications as prescribed . Attends scheduled medical appointments  Plan:   Client to attend scheduled medical appointments [name] to call client in next 3 weeks to talk with her about anxiety symptoms management of client Client to call RNCM as needed to discuss nursing needs of client Client to use relaxation techniques of choice to manage anxiety or stress symptoms  Initial goal documentation       Follow  Up Plan: [name] will call client in next 3 weeks to talk with client about anxiety symptoms management for client  [name].Kinzlee Selvy MSW, [name] Licensed Clinical Social Worker [facility] Care Management [phone]

## 2019-05-03 NOTE — Patient Instructions (Signed)
Licensed Clinical Social Worker Visit Information  Goals we discussed today:  Goals    . Client will talk  with [name] in next 30 days about managing anxiety issues for client (pt-stated)     Current Barriers:  . Client fatigues easily . Client has tracheostomy .   Clinical Social Work Clinical Goal(s):  . Client and [name] to communicate in next 30 days to discuss client management of anxiety issues of client  Interventions: . [name] talked previously with [name] about nursing support with RNCM . [name] talked previously with [name] about her in home care needs of client  . Talked previously with [name] about ambulation needs (she uses a cane to help her  walk) . Previously talked with client about relaxation techniques of choice (she likes to watch TV, or to sit on porch, talk on the phone with family members, likes to go out to eat)  Patient Self Care Activities:  . Takes medications as prescribed . Attends scheduled medical appointments  Plan:   Client to attend scheduled medical appointments [name] to call client in next 3 weeks to talk with her about anxiety symptoms management of client Client to call RNCM as needed to discuss nursing needs of client Client to use relaxation techniques of choice to manage anxiety or stress symptoms  Initial goal documentation         Materials Provided: No  Follow Up Plan: [name] to call client in next 3 weeks to talk with her about anxiety symptoms  management for client  The patient verbalized understanding of instructions provided today and declined a print copy of patient instruction materials.   [name].Hibo Blasdell MSW, [name] Licensed Clinical Social Worker [facility] Care Management [phone]

## 2019-05-06 ENCOUNTER — Ambulatory Visit: Payer: Self-pay | Admitting: Licensed Clinical Social Worker

## 2019-05-06 DIAGNOSIS — C73 Malignant neoplasm of thyroid gland: Secondary | ICD-10-CM

## 2019-05-06 DIAGNOSIS — Z930 Tracheostomy status: Secondary | ICD-10-CM

## 2019-05-06 DIAGNOSIS — E538 Deficiency of other specified B group vitamins: Secondary | ICD-10-CM

## 2019-05-06 DIAGNOSIS — F411 Generalized anxiety disorder: Secondary | ICD-10-CM

## 2019-05-06 DIAGNOSIS — F339 Major depressive disorder, recurrent, unspecified: Secondary | ICD-10-CM

## 2019-05-06 DIAGNOSIS — I10 Essential (primary) hypertension: Secondary | ICD-10-CM

## 2019-05-06 DIAGNOSIS — K219 Gastro-esophageal reflux disease without esophagitis: Secondary | ICD-10-CM

## 2019-05-06 NOTE — Patient Instructions (Addendum)
Licensed Clinical Social Worker Visit Information  Goals we discussed today:  Goals    . Client will talk  with [name] in next 30 days about managing anxiety issues for client (pt-stated)     Current Barriers:  . Client fatigues easily . Client has tracheostomy .   Clinical Social Work Clinical Goal(s):  . Client and [name] to communicate in next 30 days to discuss client management of anxiety issues of client  Interventions: . [name] talked with [name] about nursing support with RNCM . [name] talked with Lolitha about her in home care needs of client  . Talked previously with [name] about ambulation needs (she uses a cane to help her  walk)  Talked previously with client about relaxation techniques of choice (she likes to watch TV, or to sit on porch, talk on the phone with family members, likes to go out to eat)    Encouraged [name] to call customer services at [employer] to talk with representative about insurance benefits of client  Patient Self Care Activities:  . Takes medications as prescribed . Attends scheduled medical appointments  Plan:   Client to attend scheduled medical appointments [name] to call client in next 3 weeks to talk with her about anxiety symptoms management of client Client to call RNCM as needed to discuss nursing needs of client Client to use relaxation techniques of choice to manage anxiety or stress symptoms  Initial goal documentation         Materials Provided: No  Follow Up Plan:   [name] to call client in next 3 weeks to talk with her about anxiety symptoms management for client   The patient verbalized understanding of instructions provided today and declined a print copy of patient instruction materials.   [name].Ariatna Jester MSW, [name] Licensed Clinical Social Worker [facility] Care Management [phone]

## 2019-05-06 NOTE — Chronic Care Management (AMB) (Signed)
Care Management Note   [name] is a 78 y.o. year old female who is a primary care patient of [name], [name], MD. The CM team was consulted for assistance with chronic disease management and care coordination.   I reached out to [name] by phone today.   Review of patient status, including review of consultants reports, relevant laboratory and other test results, and collaboration with appropriate care team members and the patient's provider was performed as part of comprehensive patient evaluation and provision of chronic care management services.   Social determinants of health: risk of social isolation; risk of physical inactivity    Chronic Care Management from 04/12/2019 in [place]  PHQ-9 Total Score  5     GAD 7 : Generalized Anxiety Score 04/12/2019  Nervous, Anxious, on Edge 1  Control/stop worrying 1  Worry too much - different things 1  Trouble relaxing 1  Restless 0  Easily annoyed or irritable 0  Afraid - awful might happen 0  Total GAD 7 Score 4  Anxiety Difficulty Somewhat difficult   Medications   New medications from outside sources are available for reconciliation  Outpatient Medications   aspirin 81 MG chewable tablet    benzonatate (TESSALON) 100 MG capsule    calcium carbonate (OS-CAL - DOSED IN MG OF ELEMENTAL CALCIUM) 1250 (500 Ca) MG tablet    clonazePAM (KLONOPIN) 0.25 MG disintegrating tablet    furosemide (LASIX) 20 MG tablet    ketoconazole (NIZORAL) 2 % cream    latanoprost (XALATAN) 0.005 % ophthalmic solution    levothyroxine (SYNTHROID, LEVOTHROID) 100 MCG tablet    levothyroxine (SYNTHROID, LEVOTHROID) 112 MCG tablet    linaclotide (LINZESS) 72 MCG capsule    loratadine (CLARITIN) 10 MG tablet    magnesium oxide (MAG-OX) 400 (241.3 Mg) MG tablet    Multiple Vitamin (MULTIVITAMIN WITH MINERALS) TABS tablet    potassium chloride (KLOR-CON) 10 MEQ tablet    QUEtiapine (SEROQUEL) 25 MG tablet    simvastatin (ZOCOR) 80 MG tablet    venlafaxine (EFFEXOR) 37.5 MG tablet   Clinic-Administered Medications   cyanocobalamin ((VITAMIN B-12)) injection 1,000 mcg    Goals    . Client will talk  with [name] in next 30 days about managing anxiety issues for client (pt-stated)     Current Barriers:  . Client fatigues easily . Client has tracheostomy .   Clinical Social Work Clinical Goal(s):  . Client and [name] to communicate in next 30 days to discuss client management of anxiety issues of client  Interventions: . [name] talked with [name] about nursing support with RNCM . [name] talked with [name] about her in home care needs of client  . Talked with [name] about ambulation needs (she uses a cane to help her  walk) . Encouraged [name] to call customer services at [employer] to talk with representative about insurance benefits of client . Talked previously with client about relaxation techniques of choice (she likes to watch TV, or to sit on porch, talk on the phone with family members, likes to go out to eat)  Patient Self Care Activities:  . Takes medications as prescribed . Attends scheduled medical appointments  Plan:   Client to attend scheduled medical appointments [name] to call client in next 3 weeks to talk with her about anxiety symptoms management of client Client to call RNCM as needed to discuss nursing needs of client Client to use relaxation techniques of choice  to manage anxiety or stress symptoms  Initial goal documentation        Follow Up Plan: [name] to call client in next 3 weeks to talk with client about anxiety or stress symptoms management  [name].[name] MSW, [name] Licensed Clinical Social Worker [facility] Care Management [phone]

## 2019-05-07 NOTE — Progress Notes (Signed)
Cardiology Office Note   Date:  05/08/2019   ID:  [name], DOB [birth year], MRN [id number]  PCP:  [name], [name], MD  Cardiologist:   No primary care provider on file.   No chief complaint on file.     History of Present Illness: [name] is a 78 y.o. female who presents for follow up after AVR and aortic surgery.  She was admitted in Jan 2019 for elective AVR and ascending root replacement.  Post op she had tamponade and VF arrest.  She required an RVAD.    She had a tracheostomy placed and did go to rehab.   Follow up echo demonstrated a normal EF.   There was moderate TR.  The AVR looked OK.   Since I last saw her in July she was in the hospital in October for treatment of laryngeal stenosis.  Unfortunately since I last saw her she has been told that she has too much scar tissue and they will not be able to decannulate her.    She is does okay from a cardiovascular standpoint.  She gets very anxious at night.  However, during the day she does okay.  She does her household chores.The patient denies any new symptoms such as chest discomfort, neck or arm discomfort. There has been no new shortness of breath, PND or orthopnea. There have been no reported palpitations, presyncope or syncope.   Past Medical History:  Diagnosis Date  . ACL tear    right  Dr. Gladstone Pih   . Adenomatous polyp   . Anxiety   . Arthritis    "all over" (04/12/2018)  . Ascending aortic aneurysm ([place])    note per chart per Dr Lucianne Lei Tright 4.7 cm 04/15/2015   . Asthma   . B12 deficiency   . Chronic bronchitis ([place])   . Depression   . DUB (dysfunctional uterine bleeding) 10/96  . Fibromyalgia   . GERD (gastroesophageal reflux disease)   . Glaucoma    bilaterally  . Helicobacter pylori (H. pylori)   . Hiatal hernia   . History of blood transfusion 06/2017   "related to OHS"  . History of kidney stones   . History of left shoulder fracture    pt states fell off bed and broke ball in  shoulder had rod placed   . Hyperlipidemia   . Hypertension   . Hypothyroidism   . Inspiratory stridor   . Laryngeal stenosis   . Occasional tremors    left arm   . Osteoarthritis   . Osteopenia   . Osteoporosis   . Thyroid cancer ([place]) 2017  . Thyroid nodule   . Tracheostomy in place [facility])     Past Surgical History:  Procedure Laterality Date  . AORTIC VALVE REPLACEMENT N/A 07/10/2017   Procedure: AORTIC VALVE REPLACEMENT (AVR);  Surgeon: [name], Collier Salina, MD;  Location: [place];  Service: Open Heart Surgery;  Laterality: N/A;  . APPENDECTOMY    . BUNIONECTOMY  08/1999   right - Dr. Irving Shows   . CARDIAC VALVE REPLACEMENT    . CATARACT EXTRACTION, BILATERAL Bilateral 05/28/07-2009   "right-left"  . CHOLECYSTECTOMY OPEN  1979  . DILATION AND CURETTAGE OF UTERUS  03/31/95   Dr. Ovid Curd   . DIRECT LARYNGOSCOPY WITH BOTOX INJECTION N/A 12/01/2017   Procedure: SUSPENDED DIRECT MICROLARYNGOSCOPY WITH KENALOG INJECTION, CO2 LASER;  Surgeon: Melida Quitter, MD;  Location: [place];  Service: ENT;  Laterality: N/A;  . EXCISIONAL HEMORRHOIDECTOMY    .  EXPLORATION POST OPERATIVE OPEN HEART N/A 07/11/2017   Procedure: EXPLORATION POST OPERATIVE OPEN HEART;  Surgeon: Ivin Poot, MD;  Location: [place];  Service: Open Heart Surgery;  Laterality: N/A;  . EYE SURGERY     also had left cataract removed   . FRACTURE SURGERY    . HEMATOMA EVACUATION N/A 07/11/2017   Procedure: EVACUATION HEMATOMA;  Surgeon: Ivin Poot, MD;  Location: [place];  Service: Open Heart Surgery;  Laterality: N/A;  . HERNIA REPAIR    . JOINT REPLACEMENT    . MICROLARYNGOSCOPY WITH LASER AND BALLOON DILATION N/A 01/24/2018   Procedure: MICROLARYNGOSCOPY WITH LASER AND BALLOON DILATION;  Surgeon: Melida Quitter, MD;  Location: [place];  Service: ENT;  Laterality: N/A;  . MICROLARYNGOSCOPY WITH LASER AND BALLOON DILATION N/A 04/13/2018   Procedure: MICROLARYNGOSCOPY WITH BALLOON DILATION;  Surgeon: Melida Quitter, MD;  [facility];  Service: ENT;  Laterality: N/A;  . NEPHROLITHOTOMY Left 04/07/2017   Procedure: NEPHROLITHOTOMY PERCUTANEOUS WITH SURGEON ACCESS;  Surgeon: Ardis Hughs, MD;  [facility];  Service: Urology;  Laterality: Left;  . ORIF HUMERUS FRACTURE  05/30/2011   Procedure: OPEN REDUCTION INTERNAL FIXATION (ORIF) PROXIMAL HUMERUS FRACTURE;  Surgeon: Augustin Schooling;  Location: [place];  Service: Orthopedics;  Laterality: Left;  open reduction internal fixation of proximal humerus fracture  . PLACEMENT OF CENTRIMAG VENTRICULAR ASSIST DEVICE Right 07/11/2017   Procedure: PLACEMENT OF CENTRIMAG VENTRICULAR ASSIST DEVICE;  Surgeon: Ivin Poot, MD;  Location: [place];  Service: Open Heart Surgery;  Laterality: Right;  . REMOVAL OF CENTRIMAG VENTRICULAR ASSIST DEVICE N/A 07/17/2017   Procedure: REMOVAL OF RVAD WITH PUMP STANDBY;  Surgeon: Ivin Poot, MD;  Location: [place];  Service: Open Heart Surgery;  Laterality: N/A;  . REPLACEMENT ASCENDING AORTA N/A 07/10/2017   Procedure: REPLACEMENT ASCENDING AORTA;  Surgeon: Ivin Poot, MD;  Location: [place];  Service: Open Heart Surgery;  Laterality: N/A;  . RIGHT/LEFT HEART CATH AND CORONARY ANGIOGRAPHY N/A 06/13/2017   Procedure: RIGHT/LEFT HEART CATH AND CORONARY ANGIOGRAPHY;  Surgeon: Jolaine Artist, MD;  Location: [place] [facility];  Service: Cardiovascular;  Laterality: N/A;  . TEE WITHOUT CARDIOVERSION N/A 07/10/2017   Procedure: TRANSESOPHAGEAL ECHOCARDIOGRAM (TEE);  Surgeon: [name], Collier Salina, MD;  Location: [place];  Service: Open Heart Surgery;  Laterality: N/A;  . TEE WITHOUT CARDIOVERSION  07/11/2017   Procedure: TRANSESOPHAGEAL ECHOCARDIOGRAM (TEE);  Surgeon: [name], Collier Salina, MD;  Location: [place];  Service: Open Heart Surgery;;  . TEE WITHOUT CARDIOVERSION N/A 07/17/2017   Procedure: TRANSESOPHAGEAL ECHOCARDIOGRAM (TEE);  Surgeon: [name], Collier Salina, MD;  Location: [place];  Service: Open Heart Surgery;  Laterality: N/A;  . THYROID  LOBECTOMY Left 07/27/2015   Procedure: LEFT THYROID LOBECTOMY;  Surgeon: Fanny Skates, MD;  [facility];  Service: General;  Laterality: Left;  . THYROIDECTOMY N/A 08/06/2015   Procedure: RIGHT THYROID LOBECTOMY, REIMPLANTATION PARATHYROID;  Surgeon: Fanny Skates, MD;  [facility];  Service: General;  Laterality: N/A;  . TOTAL KNEE ARTHROPLASTY Right 08/2009   Dr. Gladstone Pih  . TRACHEOSTOMY  08/08/2017  . VENTRAL HERNIA REPAIR  2/99     Current Outpatient Medications  Medication Sig Dispense Refill  . aspirin 81 MG chewable tablet Chew 81 mg by mouth at bedtime.     . benzonatate (TESSALON) 100 MG capsule Take 1 capsule (100 mg total) by mouth 3 (three) times daily as needed for cough. 20 capsule 0  .  calcium carbonate (OS-CAL - DOSED IN MG OF ELEMENTAL CALCIUM) 1250 (500 Ca) MG tablet Take 1 tablet (500 mg of elemental calcium total) by mouth daily with breakfast. 30 tablet 0  . clonazePAM (KLONOPIN) 0.25 MG disintegrating tablet DISSOLVE 1 TABLET BY MOUTH 3 TIMES A DAY 90 tablet 5  . furosemide (LASIX) 20 MG tablet Take 10 mg by mouth daily. 10 mg po  Mon/Wed/Fridays    . ketoconazole (NIZORAL) 2 % cream Apply topically daily. 15 g 0  . latanoprost (XALATAN) 0.005 % ophthalmic solution Place 1 drop into both eyes at bedtime. 2.5 mL 12  . levothyroxine (SYNTHROID, LEVOTHROID) 100 MCG tablet Take 100 mcg by mouth See admin instructions.     Marland Kitchen linaclotide (LINZESS) 72 MCG capsule Take 1 capsule (72 mcg total) by mouth daily before breakfast. 30 capsule 5  . loratadine (CLARITIN) 10 MG tablet TAKE 1 TABLET DAILY 30 tablet 5  . magnesium oxide (MAG-OX) 400 (241.3 Mg) MG tablet Take 1 tablet (400 mg total) by mouth daily. (Patient taking differently: Take 400 mg by mouth 2 (two) times daily. ) 30 tablet 0  . Multiple Vitamin (MULTIVITAMIN WITH MINERALS) TABS tablet Take 1 tablet by mouth daily.    . potassium chloride (KLOR-CON) 10 MEQ tablet TAKE 2 TABLETS TWICE A DAY 120 tablet 2  .  QUEtiapine (SEROQUEL) 25 MG tablet TAKE 3 TABLETS AT BEDTIME AS DIRECTED 90 tablet 5  . simvastatin (ZOCOR) 80 MG tablet Take 1 tablet (80 mg total) by mouth daily. 90 tablet 3  . venlafaxine (EFFEXOR) 37.5 MG tablet Take 1 tablet (37.5 mg total) by mouth 2 (two) times daily. 60 tablet 11  . levothyroxine (SYNTHROID, LEVOTHROID) 112 MCG tablet Take 112 mcg by mouth every other day. Alternates doses     Current Facility-Administered Medications  Medication Dose Route Frequency Provider Last Rate Last Dose  . cyanocobalamin ((VITAMIN B-12)) injection 1,000 mcg  1,000 mcg Intramuscular Q30 days Chipper Herb, MD   1,000 mcg at 03/14/19 1120    Allergies:   Actonel [risedronate], Lipitor [atorvastatin calcium], Lyrica [pregabalin], Sibutramine hcl monohydrate, Alendronate sodium, Latex, Levofloxacin, and Meloxicam    ROS:  Please see the history of present illness.   Otherwise, review of systems are positive for none.   All other systems are reviewed and negative.    PHYSICAL EXAM: VS:  BP 118/78   Pulse 87   Ht 5\' 5"  (1.651 m)   Wt 179 lb (81.2 kg)   BMI 29.79 kg/m  , BMI Body mass index is 29.79 kg/m.  GENERAL:  Well appearing NECK:  No jugular venous distention, waveform within normal limits, carotid upstroke brisk and symmetric, no bruits, no thyromegaly LUNGS:  Clear to auscultation bilaterally CHEST:  Unremarkable HEART:  PMI not displaced or sustained,S1 and S2 within normal limits, no S3, no S4, no clicks, no rubs, 2 out of 6 apical systolic murmur radiating out aortic outflow tract, 2 out of 6 diastolic murmur heard at the fourth intercostal space murmurs ABD:  Flat, positive bowel sounds normal in frequency in pitch, no bruits, no rebound, no guarding, no midline pulsatile mass, no hepatomegaly, no splenomegaly EXT:  2 plus pulses throughout, no edema, no cyanosis no clubbing  EKG:  EKG is  ordered today. Sinus rhythm, rate 87, right bundle branch block, left anterior  fascicular block, no acute ST-T wave changes.  Recent Labs: 01/18/2019: ALT 9; BUN 12; Creatinine, Ser 0.91; Hemoglobin 11.9; Platelets 215; Potassium 3.9; Sodium 145;  TSH 0.493    Lipid Panel    Component Value Date/Time   CHOL 155 01/18/2019 1041   CHOL 166 10/25/2012 0826   TRIG 101 01/18/2019 1041   TRIG 92 05/03/2016 1437   TRIG 82 10/25/2012 0826   HDL 66 01/18/2019 1041   HDL 58 05/03/2016 1437   HDL 69 10/25/2012 0826   CHOLHDL 2.3 01/18/2019 1041   LDLCALC 69 01/18/2019 1041   LDLCALC 91 03/14/2014 1139   LDLCALC 81 10/25/2012 0826      Wt Readings from Last 3 Encounters:  05/08/19 179 lb (81.2 kg)  04/11/19 174 lb 12.8 oz (79.3 kg)  01/18/19 174 lb (78.9 kg)      Other studies Reviewed: Additional studies/ records that were reviewed today include:   None Review of the above records demonstrates:  NA   ASSESSMENT AND PLAN   AVR/AORTIC ROOT REPLACEMENT:    Echo in March demonstrated stable replacement and AVR.   No further imaging.  No change in therapy.   RV FAILURE:   This resolved on follow up echo.  HTN:  The blood pressure is at target.  Continue current therapy.   Current medicines are reviewed at length with the patient today.  The patient does not have concerns regarding medicines.  The following changes have been made:  None  Labs/ tests ordered today include: None  No orders of the defined types were placed in this encounter.    Disposition:   FU with me in 12 months.     Signed, Minus Breeding, MD  05/08/2019 4:11 PM   [place]

## 2019-05-08 ENCOUNTER — Other Ambulatory Visit: Payer: Self-pay

## 2019-05-08 ENCOUNTER — Encounter: Payer: Self-pay | Admitting: Cardiology

## 2019-05-08 ENCOUNTER — Ambulatory Visit: Payer: Medicare Other | Admitting: Cardiology

## 2019-05-08 VITALS — BP 118/78 | HR 87 | Ht 65.0 in | Wt 179.0 lb

## 2019-05-08 DIAGNOSIS — I10 Essential (primary) hypertension: Secondary | ICD-10-CM

## 2019-05-08 DIAGNOSIS — J3802 Paralysis of vocal cords and larynx, bilateral: Secondary | ICD-10-CM | POA: Diagnosis not present

## 2019-05-08 DIAGNOSIS — Z952 Presence of prosthetic heart valve: Secondary | ICD-10-CM | POA: Diagnosis not present

## 2019-05-08 DIAGNOSIS — J386 Stenosis of larynx: Secondary | ICD-10-CM | POA: Diagnosis not present

## 2019-05-08 DIAGNOSIS — Z930 Tracheostomy status: Secondary | ICD-10-CM | POA: Diagnosis not present

## 2019-05-08 DIAGNOSIS — L929 Granulomatous disorder of the skin and subcutaneous tissue, unspecified: Secondary | ICD-10-CM | POA: Diagnosis not present

## 2019-05-08 DIAGNOSIS — R490 Dysphonia: Secondary | ICD-10-CM | POA: Diagnosis not present

## 2019-05-08 NOTE — Patient Instructions (Signed)
Medication Instructions:  The current medical regimen is effective;  continue present plan and medications.  *If you need a refill on your cardiac medications before your next appointment, please call your pharmacy*  Follow-Up: At [facility], you and your health needs are our priority.  As part of our continuing mission to provide you with exceptional heart care, we have created designated Provider Care Teams.  These Care Teams include your primary Cardiologist (physician) and Advanced Practice Providers (APPs -  Physician Assistants and Nurse Practitioners) who all work together to provide you with the care you need, when you need it.  Your next appointment:   12 months  The format for your next appointment:   In Person  Provider:   in [place] with Dr Percival Spanish  Thank you for choosing [facility]!!

## 2019-05-15 ENCOUNTER — Other Ambulatory Visit: Payer: Self-pay | Admitting: Family Medicine

## 2019-05-15 DIAGNOSIS — R05 Cough: Secondary | ICD-10-CM

## 2019-05-15 DIAGNOSIS — R059 Cough, unspecified: Secondary | ICD-10-CM

## 2019-05-16 ENCOUNTER — Other Ambulatory Visit: Payer: Self-pay | Admitting: *Deleted

## 2019-05-16 MED ORDER — LORATADINE 10 MG PO TABS: 10.0000 mg | ORAL_TABLET | Freq: Every day | ORAL | 5 refills | Status: DC

## 2019-05-21 ENCOUNTER — Ambulatory Visit (INDEPENDENT_AMBULATORY_CARE_PROVIDER_SITE_OTHER): Payer: Medicare Other | Admitting: Family Medicine

## 2019-05-21 ENCOUNTER — Encounter: Payer: Self-pay | Admitting: Family Medicine

## 2019-05-21 ENCOUNTER — Other Ambulatory Visit: Payer: Self-pay

## 2019-05-21 ENCOUNTER — Ambulatory Visit (INDEPENDENT_AMBULATORY_CARE_PROVIDER_SITE_OTHER): Payer: Medicare Other

## 2019-05-21 DIAGNOSIS — E538 Deficiency of other specified B group vitamins: Secondary | ICD-10-CM | POA: Diagnosis not present

## 2019-05-21 DIAGNOSIS — R059 Cough, unspecified: Secondary | ICD-10-CM

## 2019-05-21 DIAGNOSIS — R05 Cough: Secondary | ICD-10-CM

## 2019-05-21 MED ORDER — BENZONATATE 100 MG PO CAPS: 100.0000 mg | ORAL_CAPSULE | Freq: Three times a day (TID) | ORAL | 3 refills | Status: AC

## 2019-05-21 NOTE — Progress Notes (Signed)
Virtual Visit via telephone Note Due to COVID-19 pandemic this visit was conducted virtually. This visit type was conducted due to national recommendations for restrictions regarding the COVID-19 Pandemic (e.g. social distancing, sheltering in place) in an effort to limit this patient's exposure and mitigate transmission in our community. All issues noted in this document were discussed and addressed.  A physical exam was not performed with this format.   I connected with [name] on 05/21/2019 at 1545 by telephone and verified that I am speaking with the correct person using two identifiers. [name] is currently located at home and family is currently with them during visit. The provider, Monia Pouch, FNP is located in their office at time of visit.  I discussed the limitations, risks, security and privacy concerns of performing an evaluation and management service by telephone and the availability of in person appointments. I also discussed with the patient that there may be a patient responsible charge related to this service. The patient expressed understanding and agreed to proceed.  Subjective:  Patient ID: [name], female    DOB: 1940/11/12, 78 y.o.   MRN: [id number]  Chief Complaint:  Cough   HPI: [name] is a 78 y.o. female presenting on 05/21/2019 for Cough   Pt has a trach, pt has a cough due to the trach and reports she needs a refill on her tessalon. States she has been taking this at night to help with the cough and it has been working well. She denies fever, shortness of breath, sore throat, or congestion. States otherwise she is doing really well.     Relevant past medical, surgical, family, and social history reviewed and updated as indicated.  Allergies and medications reviewed and updated.   Past Medical History:  Diagnosis Date  . ACL tear    right  Dr. Gladstone Pih   . Adenomatous polyp   . Anxiety   . Arthritis    "all over"  (04/12/2018)  . Ascending aortic aneurysm ([place])    note per chart per Dr Lucianne Lei Tright 4.7 cm 04/15/2015   . Asthma   . B12 deficiency   . Chronic bronchitis ([place])   . Depression   . DUB (dysfunctional uterine bleeding) 10/96  . Fibromyalgia   . GERD (gastroesophageal reflux disease)   . Glaucoma    bilaterally  . Helicobacter pylori (H. pylori)   . Hiatal hernia   . History of blood transfusion 06/2017   "related to OHS"  . History of kidney stones   . History of left shoulder fracture    pt states fell off bed and broke ball in shoulder had rod placed   . Hyperlipidemia   . Hypertension   . Hypothyroidism   . Inspiratory stridor   . Laryngeal stenosis   . Occasional tremors    left arm   . Osteoarthritis   . Osteopenia   . Osteoporosis   . Thyroid cancer ([place]) 2017  . Thyroid nodule   . Tracheostomy in place [facility])     Past Surgical History:  Procedure Laterality Date  . AORTIC VALVE REPLACEMENT N/A 07/10/2017   Procedure: AORTIC VALVE REPLACEMENT (AVR);  Surgeon: [name], Collier Salina, MD;  Location: [place];  Service: Open Heart Surgery;  Laterality: N/A;  . APPENDECTOMY    . BUNIONECTOMY  08/1999   right - Dr. Irving Shows   . CARDIAC VALVE REPLACEMENT    . CATARACT EXTRACTION, BILATERAL Bilateral 05/28/07-2009   "right-left"  .  CHOLECYSTECTOMY OPEN  1979  . DILATION AND CURETTAGE OF UTERUS  03/31/95   Dr. Ovid Curd   . DIRECT LARYNGOSCOPY WITH BOTOX INJECTION N/A 12/01/2017   Procedure: SUSPENDED DIRECT MICROLARYNGOSCOPY WITH KENALOG INJECTION, CO2 LASER;  Surgeon: Melida Quitter, MD;  Location: [place];  Service: ENT;  Laterality: N/A;  . EXCISIONAL HEMORRHOIDECTOMY    . EXPLORATION POST OPERATIVE OPEN HEART N/A 07/11/2017   Procedure: EXPLORATION POST OPERATIVE OPEN HEART;  Surgeon: Ivin Poot, MD;  Location: [place];  Service: Open Heart Surgery;  Laterality: N/A;  . EYE SURGERY     also had left cataract removed   . FRACTURE SURGERY    . HEMATOMA EVACUATION N/A 07/11/2017    Procedure: EVACUATION HEMATOMA;  Surgeon: Ivin Poot, MD;  Location: [place];  Service: Open Heart Surgery;  Laterality: N/A;  . HERNIA REPAIR    . JOINT REPLACEMENT    . MICROLARYNGOSCOPY WITH LASER AND BALLOON DILATION N/A 01/24/2018   Procedure: MICROLARYNGOSCOPY WITH LASER AND BALLOON DILATION;  Surgeon: Melida Quitter, MD;  Location: [place];  Service: ENT;  Laterality: N/A;  . MICROLARYNGOSCOPY WITH LASER AND BALLOON DILATION N/A 04/13/2018   Procedure: MICROLARYNGOSCOPY WITH BALLOON DILATION;  Surgeon: Melida Quitter, MD;  Location: [facility] OR;  Service: ENT;  Laterality: N/A;  . NEPHROLITHOTOMY Left 04/07/2017   Procedure: NEPHROLITHOTOMY PERCUTANEOUS WITH SURGEON ACCESS;  Surgeon: Ardis Hughs, MD;  [facility];  Service: Urology;  Laterality: Left;  . ORIF HUMERUS FRACTURE  05/30/2011   Procedure: OPEN REDUCTION INTERNAL FIXATION (ORIF) PROXIMAL HUMERUS FRACTURE;  Surgeon: Augustin Schooling;  Location: [place];  Service: Orthopedics;  Laterality: Left;  open reduction internal fixation of proximal humerus fracture  . PLACEMENT OF CENTRIMAG VENTRICULAR ASSIST DEVICE Right 07/11/2017   Procedure: PLACEMENT OF CENTRIMAG VENTRICULAR ASSIST DEVICE;  Surgeon: Ivin Poot, MD;  Location: [place];  Service: Open Heart Surgery;  Laterality: Right;  . REMOVAL OF CENTRIMAG VENTRICULAR ASSIST DEVICE N/A 07/17/2017   Procedure: REMOVAL OF RVAD WITH PUMP STANDBY;  Surgeon: Ivin Poot, MD;  Location: [place];  Service: Open Heart Surgery;  Laterality: N/A;  . REPLACEMENT ASCENDING AORTA N/A 07/10/2017   Procedure: REPLACEMENT ASCENDING AORTA;  Surgeon: Ivin Poot, MD;  Location: [place];  Service: Open Heart Surgery;  Laterality: N/A;  . RIGHT/LEFT HEART CATH AND CORONARY ANGIOGRAPHY N/A 06/13/2017   Procedure: RIGHT/LEFT HEART CATH AND CORONARY ANGIOGRAPHY;  Surgeon: Jolaine Artist, MD;  Location: [place] [facility];  Service: Cardiovascular;  Laterality: N/A;  . TEE WITHOUT  CARDIOVERSION N/A 07/10/2017   Procedure: TRANSESOPHAGEAL ECHOCARDIOGRAM (TEE);  Surgeon: [name], Collier Salina, MD;  Location: [place];  Service: Open Heart Surgery;  Laterality: N/A;  . TEE WITHOUT CARDIOVERSION  07/11/2017   Procedure: TRANSESOPHAGEAL ECHOCARDIOGRAM (TEE);  Surgeon: [name], Collier Salina, MD;  Location: [place];  Service: Open Heart Surgery;;  . TEE WITHOUT CARDIOVERSION N/A 07/17/2017   Procedure: TRANSESOPHAGEAL ECHOCARDIOGRAM (TEE);  Surgeon: [name], Collier Salina, MD;  Location: [place];  Service: Open Heart Surgery;  Laterality: N/A;  . THYROID LOBECTOMY Left 07/27/2015   Procedure: LEFT THYROID LOBECTOMY;  Surgeon: Fanny Skates, MD;  [facility];  Service: General;  Laterality: Left;  . THYROIDECTOMY N/A 08/06/2015   Procedure: RIGHT THYROID LOBECTOMY, REIMPLANTATION PARATHYROID;  Surgeon: Fanny Skates, MD;  [facility];  Service: General;  Laterality: N/A;  . TOTAL KNEE ARTHROPLASTY Right 08/2009   Dr. Gladstone Pih  . TRACHEOSTOMY  08/08/2017  . VENTRAL  HERNIA REPAIR  2/99    Social History   Socioeconomic History  . Marital status: Married    Spouse name: Laverna Peace  . Number of children: 1  . Years of education: 75  . Highest education level: 11th grade  Occupational History  . Occupation: homemaker  Social Needs  . Financial resource strain: Not hard at all  . Food insecurity    Worry: Never true    Inability: Never true  . Transportation needs    Medical: No    Non-medical: No  Tobacco Use  . Smoking status: Never Smoker  . Smokeless tobacco: Never Used  Substance and Sexual Activity  . Alcohol use: No  . Drug use: Never  . Sexual activity: Not Currently    Birth control/protection: Post-menopausal  Lifestyle  . Physical activity    Days per week: 4 days    Minutes per session: 10 min  . Stress: Only a little  Relationships  . Social connections    Talks on phone: More than three times a week    Gets together: More than three times a week    Attends  religious service: Never    Active member of club or organization: No    Attends meetings of clubs or organizations: Never    Relationship status: Married  . Intimate partner violence    Fear of current or ex partner: No    Emotionally abused: No    Physically abused: No    Forced sexual activity: No  Other Topics Concern  . Not on file  Social History Narrative   Lives with husband.     Outpatient Encounter Medications as of 05/21/2019  Medication Sig  . aspirin 81 MG chewable tablet Chew 81 mg by mouth at bedtime.   . benzonatate (TESSALON) 100 MG capsule Take 1 capsule (100 mg total) by mouth 3 (three) times daily.  . calcium carbonate (OS-CAL - DOSED IN MG OF ELEMENTAL CALCIUM) 1250 (500 Ca) MG tablet Take 1 tablet (500 mg of elemental calcium total) by mouth daily with breakfast.  . clonazePAM (KLONOPIN) 0.25 MG disintegrating tablet DISSOLVE 1 TABLET BY MOUTH 3 TIMES A DAY  . furosemide (LASIX) 20 MG tablet Take 10 mg by mouth daily. 10 mg po  Mon/Wed/Fridays  . hydrocortisone (ANUSOL-HC) 2.5 % rectal cream APPLY RECTALLY 2 TIMES A DAY AS NEEDED  . ketoconazole (NIZORAL) 2 % cream Apply topically daily.  Marland Kitchen latanoprost (XALATAN) 0.005 % ophthalmic solution Place 1 drop into both eyes at bedtime.  Marland Kitchen levothyroxine (SYNTHROID, LEVOTHROID) 100 MCG tablet Take 100 mcg by mouth See admin instructions.   Marland Kitchen levothyroxine (SYNTHROID, LEVOTHROID) 112 MCG tablet Take 112 mcg by mouth every other day. Alternates doses  . linaclotide (LINZESS) 72 MCG capsule Take 1 capsule (72 mcg total) by mouth daily before breakfast.  . loratadine (CLARITIN) 10 MG tablet Take 1 tablet (10 mg total) by mouth daily.  . magnesium oxide (MAG-OX) 400 (241.3 Mg) MG tablet Take 1 tablet (400 mg total) by mouth daily. (Patient taking differently: Take 400 mg by mouth 2 (two) times daily. )  . Multiple Vitamin (MULTIVITAMIN WITH MINERALS) TABS tablet Take 1 tablet by mouth daily.  . potassium chloride (KLOR-CON) 10  MEQ tablet TAKE 2 TABLETS TWICE A DAY  . QUEtiapine (SEROQUEL) 25 MG tablet TAKE 3 TABLETS AT BEDTIME AS DIRECTED  . simvastatin (ZOCOR) 80 MG tablet Take 1 tablet (80 mg total) by mouth daily.  Marland Kitchen venlafaxine (EFFEXOR) 37.5 MG tablet  Take 1 tablet (37.5 mg total) by mouth 2 (two) times daily.  . [DISCONTINUED] benzonatate (TESSALON) 100 MG capsule Take 1 capsule (100 mg total) by mouth 3 (three) times daily as needed for cough.   Facility-Administered Encounter Medications as of 05/21/2019  Medication  . cyanocobalamin ((VITAMIN B-12)) injection 1,000 mcg    Allergies  Allergen Reactions  . Actonel [Risedronate] Other (See Comments)    Bone pain and nausea   . Lipitor [Atorvastatin Calcium] Other (See Comments)    myalgias  . Lyrica [Pregabalin] Other (See Comments)    SORES IN MOUTH   . Sibutramine Hcl Monohydrate Other (See Comments)    UNSPECIFIED REACTION   . Alendronate Sodium Nausea Only  . Latex Rash and Other (See Comments)    RA to latex 1982; includes bandaids    . Levofloxacin Nausea And Vomiting  . Meloxicam Other (See Comments)    Vaginal itching     Review of Systems  Constitutional: Negative for activity change, appetite change, chills, diaphoresis, fatigue, fever and unexpected weight change.  HENT: Negative.   Eyes: Negative.  Negative for photophobia and visual disturbance.  Respiratory: Positive for cough. Negative for chest tightness and shortness of breath.        Has trach, not ventilator or oxygen dependent.  Cardiovascular: Negative for chest pain, palpitations and leg swelling.  Gastrointestinal: Negative for abdominal pain, blood in stool, constipation, diarrhea, nausea and vomiting.  Endocrine: Negative.   Genitourinary: Negative for decreased urine volume, difficulty urinating, dysuria, frequency and urgency.  Musculoskeletal: Negative for arthralgias and myalgias.  Skin: Negative.   Allergic/Immunologic: Negative.   Neurological: Negative for  dizziness, tremors, seizures, syncope, facial asymmetry, speech difficulty, weakness, light-headedness, numbness and headaches.  Hematological: Negative.   Psychiatric/Behavioral: Negative for confusion, hallucinations, sleep disturbance and suicidal ideas.  All other systems reviewed and are negative.        Observations/Objective: No vital signs or physical exam, this was a telephone or virtual health encounter.  Pt alert and oriented, answers all questions appropriately, and able to speak in full sentences.    Assessment and Plan: Shykeria was seen today for cough.  Diagnoses and all orders for this visit:  Cough in adult Pt has trach and has had an increased cough, would like refill on tessalon, will refill today. Pt aware to report any new, worsening, or persistent symptoms.  -     benzonatate (TESSALON) 100 MG capsule; Take 1 capsule (100 mg total) by mouth 3 (three) times daily.     Follow Up Instructions: Return if symptoms worsen or fail to improve.    I discussed the assessment and treatment plan with the patient. The patient was provided an opportunity to ask questions and all were answered. The patient agreed with the plan and demonstrated an understanding of the instructions.   The patient was advised to call back or seek an in-person evaluation if the symptoms worsen or if the condition fails to improve as anticipated.  The above assessment and management plan was discussed with the patient. The patient verbalized understanding of and has agreed to the management plan. Patient is aware to call the clinic if they develop any new symptoms or if symptoms persist or worsen. Patient is aware when to return to the clinic for a follow-up visit. Patient educated on when it is appropriate to go to the emergency department.    I provided 15 minutes of non-face-to-face time during this encounter. The call started at 1545. The  call ended at 1600. The other time was used for  coordination of care.    Monia Pouch, FNP-C [facility] [address] [place], [place] [address] [phone] 05/21/2019

## 2019-05-21 NOTE — Progress Notes (Signed)
Cyanocobalamin injection given to left deltoid.  Patient tolerated well. 

## 2019-05-27 IMAGING — CR DG CHEST 2V
2 series · 2 of 2 positions shown · non-contrast
Comparison: Chest CT May 24, 2017; chest radiograph April 14, 2015

CLINICAL DATA: Ascending thoracic aortic aneurysm. Preoperative
evaluation

EXAM:
CHEST  2 VIEW

[w chest pa]
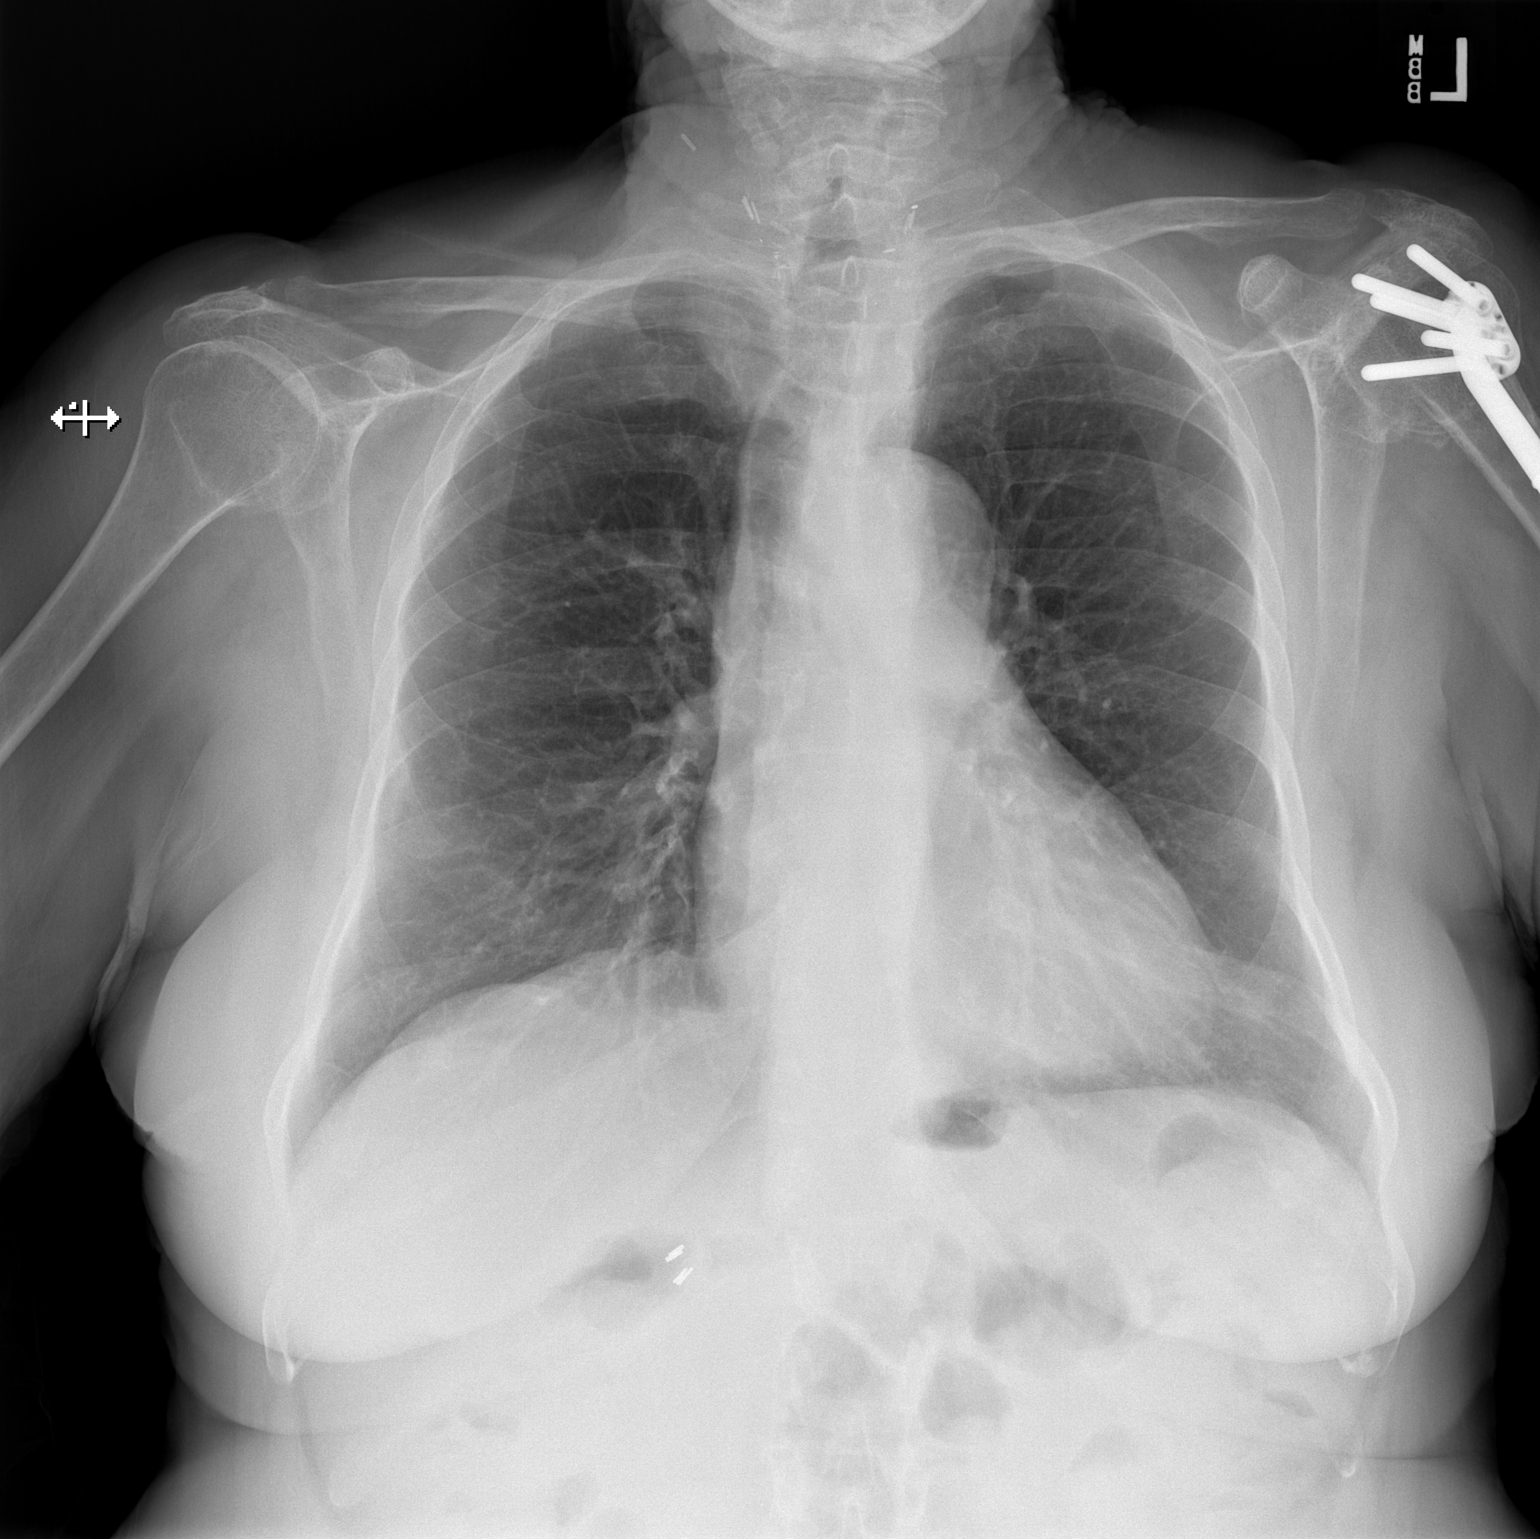

[w chest lat]
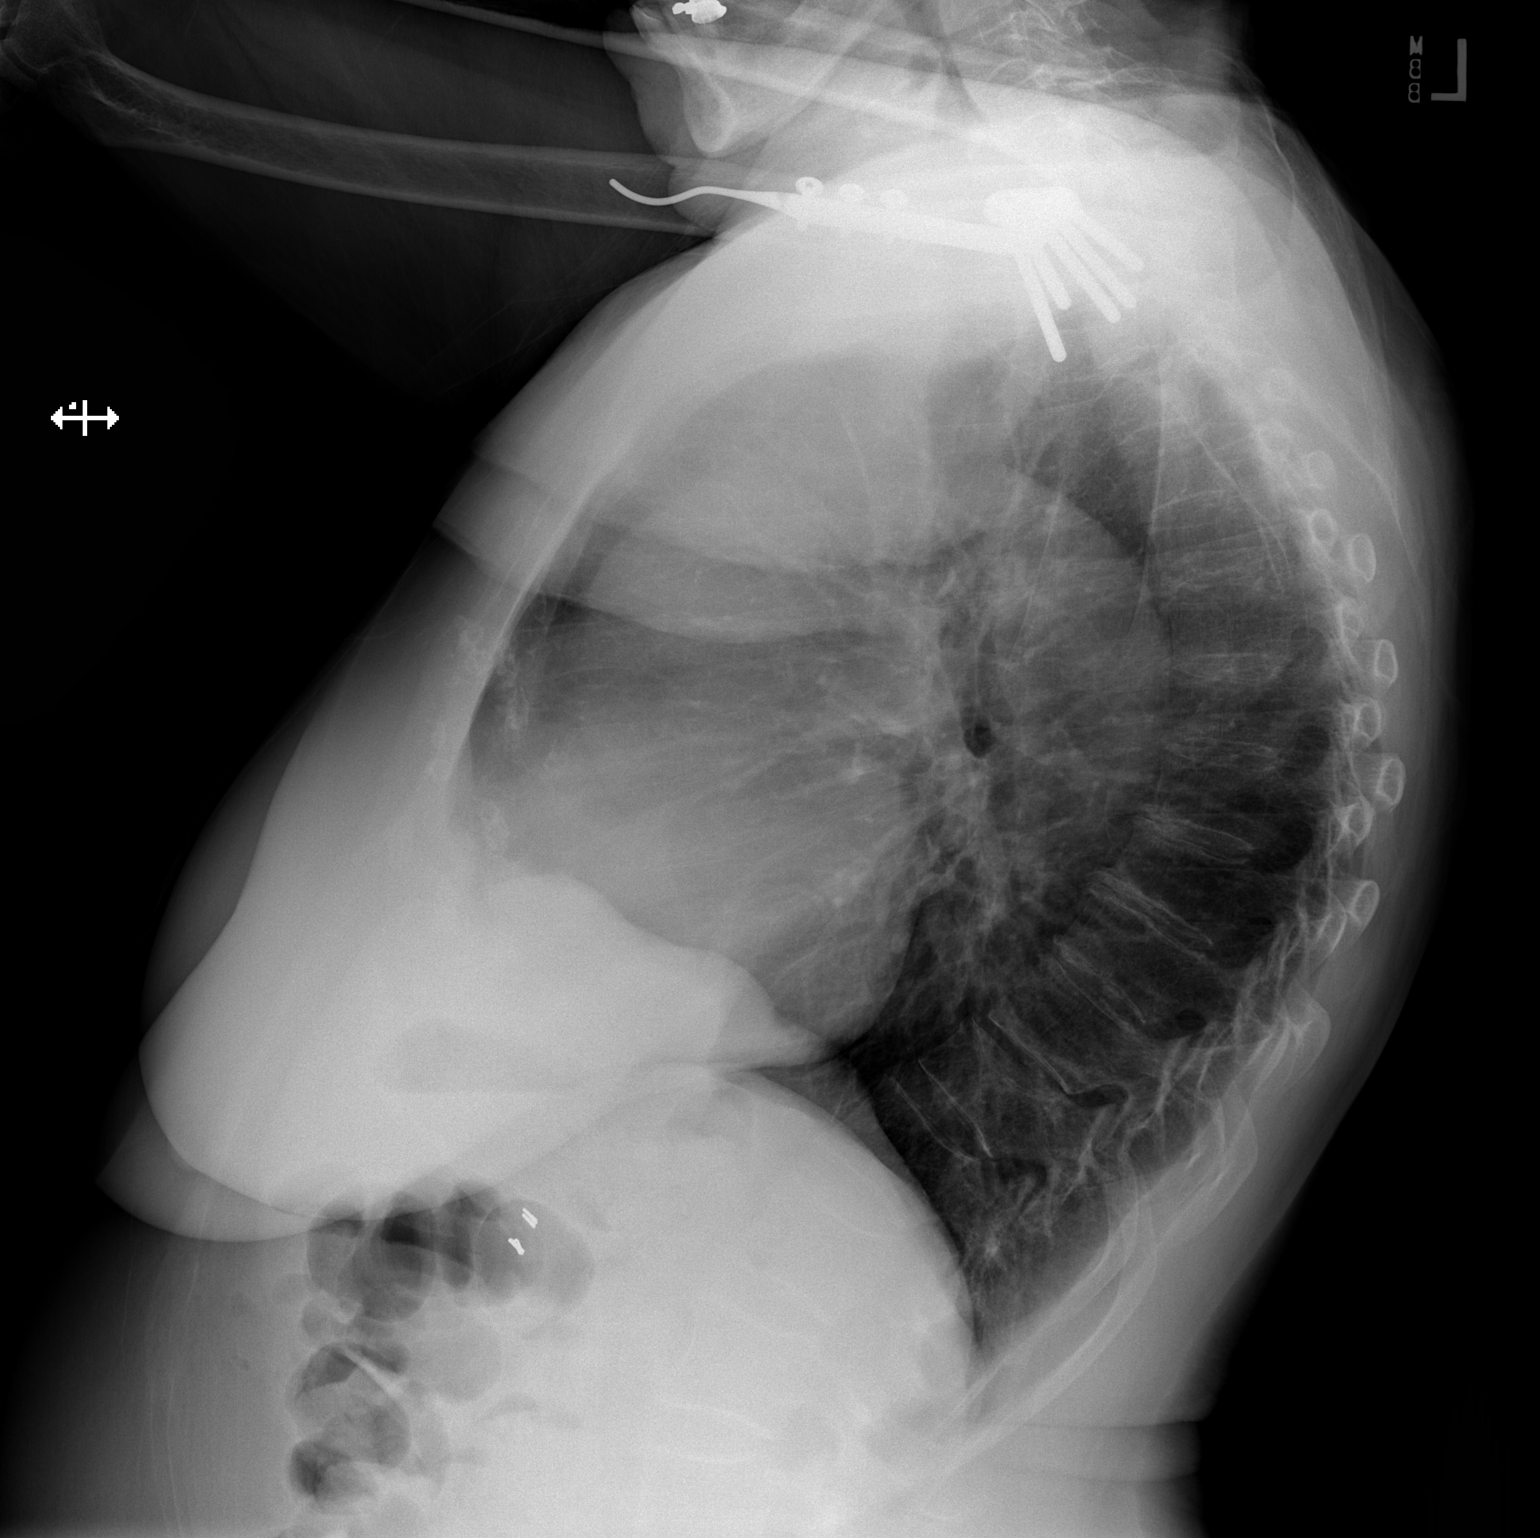

[2 of 2 positions shown; findings below may reference images not displayed]

FINDINGS: There is no edema or consolidation. Heart is upper normal in size
with pulmonary vascularity within normal limits. Prominence of the
ascending thoracic aorta is noted, better appreciated on the lateral
view.

There is no appreciable adenopathy. Postoperative changes noted in
the proximal left humerus. There is degenerative change in the
thoracic spine. There are surgical clips in the thyroid region.
IMPRESSION: Ascending thoracic aortic prominence, better seen on lateral view.
No edema or consolidation. Heart upper normal in size.

Aortic aneurysm NOS ([id number]).

## 2019-05-29 ENCOUNTER — Ambulatory Visit: Payer: Self-pay | Admitting: Licensed Clinical Social Worker

## 2019-05-29 DIAGNOSIS — Z930 Tracheostomy status: Secondary | ICD-10-CM

## 2019-05-29 DIAGNOSIS — F411 Generalized anxiety disorder: Secondary | ICD-10-CM

## 2019-05-29 DIAGNOSIS — F339 Major depressive disorder, recurrent, unspecified: Secondary | ICD-10-CM

## 2019-05-29 DIAGNOSIS — I10 Essential (primary) hypertension: Secondary | ICD-10-CM

## 2019-05-29 DIAGNOSIS — K219 Gastro-esophageal reflux disease without esophagitis: Secondary | ICD-10-CM

## 2019-05-29 DIAGNOSIS — E538 Deficiency of other specified B group vitamins: Secondary | ICD-10-CM

## 2019-05-29 NOTE — Chronic Care Management (AMB) (Signed)
Care Management Note   [name] is a 78 y.o. year old female who is a primary care patient of [name], Terri Kaufmann, MD. The CM team was consulted for assistance with chronic disease management and care coordination.   I reached out to [name] by phone today.    Review of patient status, including review of consultants reports, relevant laboratory and other test results, and collaboration with appropriate care team members and the patient's provider was performed as part of comprehensive patient evaluation and provision of chronic care management services.   Social determinants of health: risk of social isolation;risk of physical inactivity    Chronic Care Management from 04/12/2019 in [facility]  PHQ-9 Total Score  5     GAD 7 : Generalized Anxiety Score 04/12/2019  Nervous, Anxious, on Edge 1  Control/stop worrying 1  Worry too much - different things 1  Trouble relaxing 1  Restless 0  Easily annoyed or irritable 0  Afraid - awful might happen 0  Total GAD 7 Score 4  Anxiety Difficulty Somewhat difficult   Medications   Outpatient Medications   aspirin 81 MG chewable tablet    benzonatate (TESSALON) 100 MG capsule    calcium carbonate (OS-CAL - DOSED IN MG OF ELEMENTAL CALCIUM) 1250 (500 Ca) MG tablet    clonazePAM (KLONOPIN) 0.25 MG disintegrating tablet    furosemide (LASIX) 20 MG tablet    hydrocortisone (ANUSOL-HC) 2.5 % rectal cream    ketoconazole (NIZORAL) 2 % cream    latanoprost (XALATAN) 0.005 % ophthalmic solution    levothyroxine (SYNTHROID, LEVOTHROID) 100 MCG tablet    levothyroxine (SYNTHROID, LEVOTHROID) 112 MCG tablet    linaclotide (LINZESS) 72 MCG capsule    loratadine (CLARITIN) 10 MG tablet    magnesium oxide (MAG-OX) 400 (241.3 Mg) MG tablet    Multiple Vitamin (MULTIVITAMIN WITH MINERALS) TABS tablet    potassium chloride (KLOR-CON) 10 MEQ tablet    QUEtiapine (SEROQUEL) 25 MG tablet    simvastatin (ZOCOR) 80 MG  tablet    venlafaxine (EFFEXOR) 37.5 MG tablet   Clinic-Administered Medications   cyanocobalamin ((VITAMIN B-12)) injection 1,000 mcg      Goals    . Client will talk  with [name] in next 30 days about managing anxiety issues for client (pt-stated)     Current Barriers:  . Client fatigues easily . Client has tracheostomy .   Clinical Social Work Clinical Goal(s):  . Client and [name] to communicate in next 30 days to discuss client management of anxiety issues of client  Interventions: . Previously [name] talked with [name] about nursing support with RNCM . Previously [name] talked with [name] about [name] support . [name] talked previously with [name] about her in home care needs of client  . Talked with [name] previously about ambulation needs (she uses a cane to help her  walk) . Talked with client previously about meal provision for client . Previously talked with client about relaxation techniques of choice (she likes to watch TV, or to sit on porch, talk on the phone with family members, likes to go out to eat)  Patient Self Care Activities:  . Takes medications as prescribed . Attends scheduled medical appointments  Plan:   Client to attend scheduled medical appointments [name] to call client in next 3 weeks to talk with her about anxiety symptoms management of client Client to call RNCM as needed to discuss nursing needs of client Client to use relaxation techniques  of choice to manage anxiety or stress symptoms  Initial goal documentation       Follow Up Plan: [name] to call client in next 3 weeks to talk with client about anxiety symptoms management for client  [name].[name] MSW, [name] Licensed Clinical Social Worker [facility] [phone]

## 2019-05-29 NOTE — Patient Instructions (Addendum)
Licensed Clinical Social Worker Visit Information  Goals we discussed today:  Goals    . Client will talk  with [name] in next 30 days about managing anxiety issues for client (pt-stated)     Current Barriers:  . Client fatigues easily . Client has tracheostomy  Clinical Social Work Clinical Goal(s):  . Client and [name] to communicate in next 30 days to discuss client management of anxiety issues of client  Interventions:  Previously [name] talked with [name] about nursing support with RNCM  Previously [name] talked with [name] about [name] support  [name] talked previously with [name] about her in home care needs of client   Talked with Jeweline previously about ambulation needs (she uses a cane to help her  walk)  Talked with client previously about meal provision for client  Previously talked with client about relaxation techniques of choice (she likes to watch TV, or to sit on porch, talk on the phone with family members, likes to go out to eat)   Patient Self Care Activities:  . Takes medications as prescribed . Attends scheduled medical appointments  Plan:   Client to attend scheduled medical appointments [name] to call client in next 3 weeks to talk with her about anxiety symptoms management of client Client to call RNCM as needed to discuss nursing needs of client Client to use relaxation techniques of choice to manage anxiety or stress symptoms  Initial goal documentation        Materials Provided: No  Follow Up Plan: [name] to call client in next 3 weeks to talk with her about anxiety symptoms management for client  The patient verbalized understanding of instructions provided today and declined a print copy of patient instruction materials.   [name].Jashaun Penrose MSW, [name] Licensed Clinical Social Worker [facility] [phone]

## 2019-06-01 IMAGING — DX DG CHEST 1V PORT
1 series · 1 of 1 positions shown · non-contrast
Comparison: Chest radiograph July 10, 2017

CLINICAL DATA: Pulmonary edema. Status post aortic valve
replacement.

EXAM:
PORTABLE CHEST 1 VIEW

[chest]
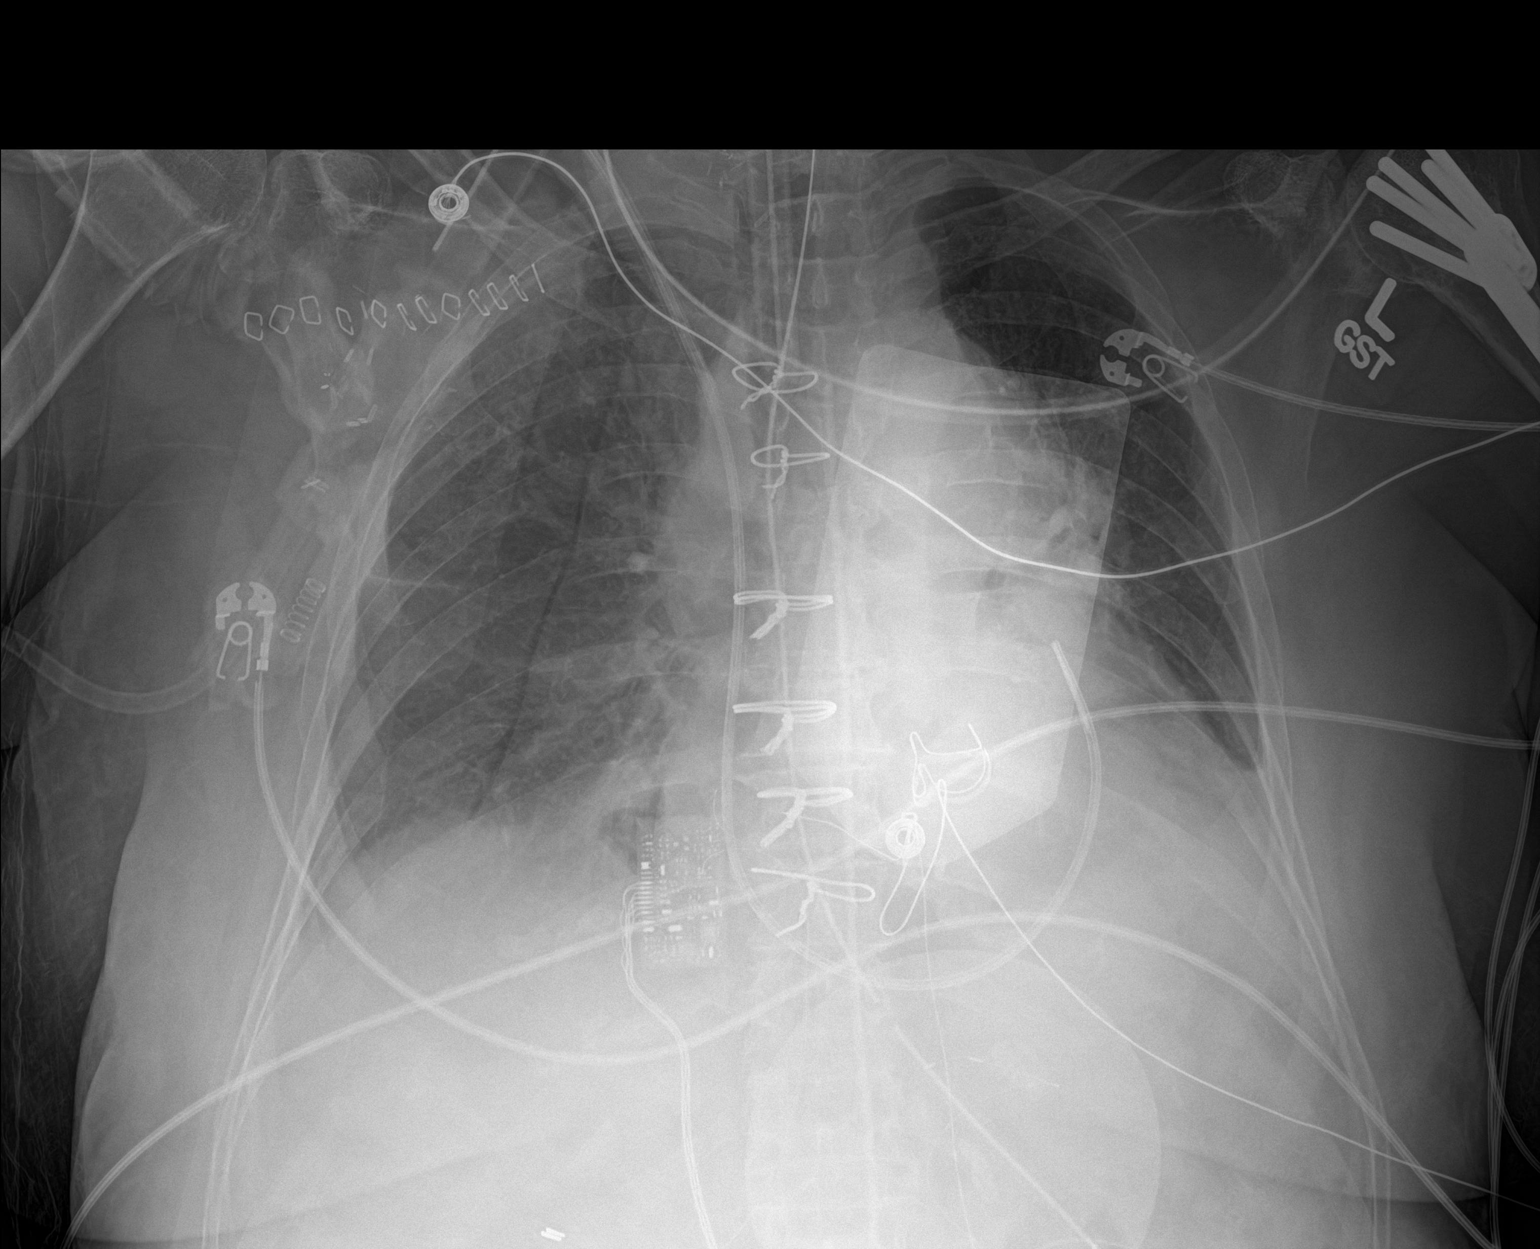

[1 of 1 positions shown; findings below may reference images not displayed]

FINDINGS: Cardiac silhouette is enlarged and unchanged. Status post median
sternotomy for aortic valve replacement. Endotracheal tube tip
projects 1.6 cm above the carina. Swan-Ganz catheter via RIGHT
internal jugular venous approach with distal tip projecting in RIGHT
ventricle. Nasogastric tube past proximal stomach with side port
projecting at GE junction. Multiple wires and pacer pad over chest.

Retrocardiac consolidation and small pleural effusions. Mild
interstitial prominence and pulmonary vascular congestion. No
pneumothorax. Status post tracheostomy. Surgical clips project in
RIGHT axilla. LEFT humerus ORIF. Surgical clips in the included
right abdomen compatible with cholecystectomy.
IMPRESSION: Relatively stable appearance of life-support lines.

Small pleural effusions.  Retrocardiac consolidation.

Stable cardiomegaly and mild pulmonary edema.

## 2019-06-01 IMAGING — CR DG CHEST 1V PORT
1 series · 1 of 1 positions shown · non-contrast
Comparison: Radiograph of same day.

CLINICAL DATA: Postop for line placement.

EXAM:
PORTABLE CHEST 1 VIEW

[AP]
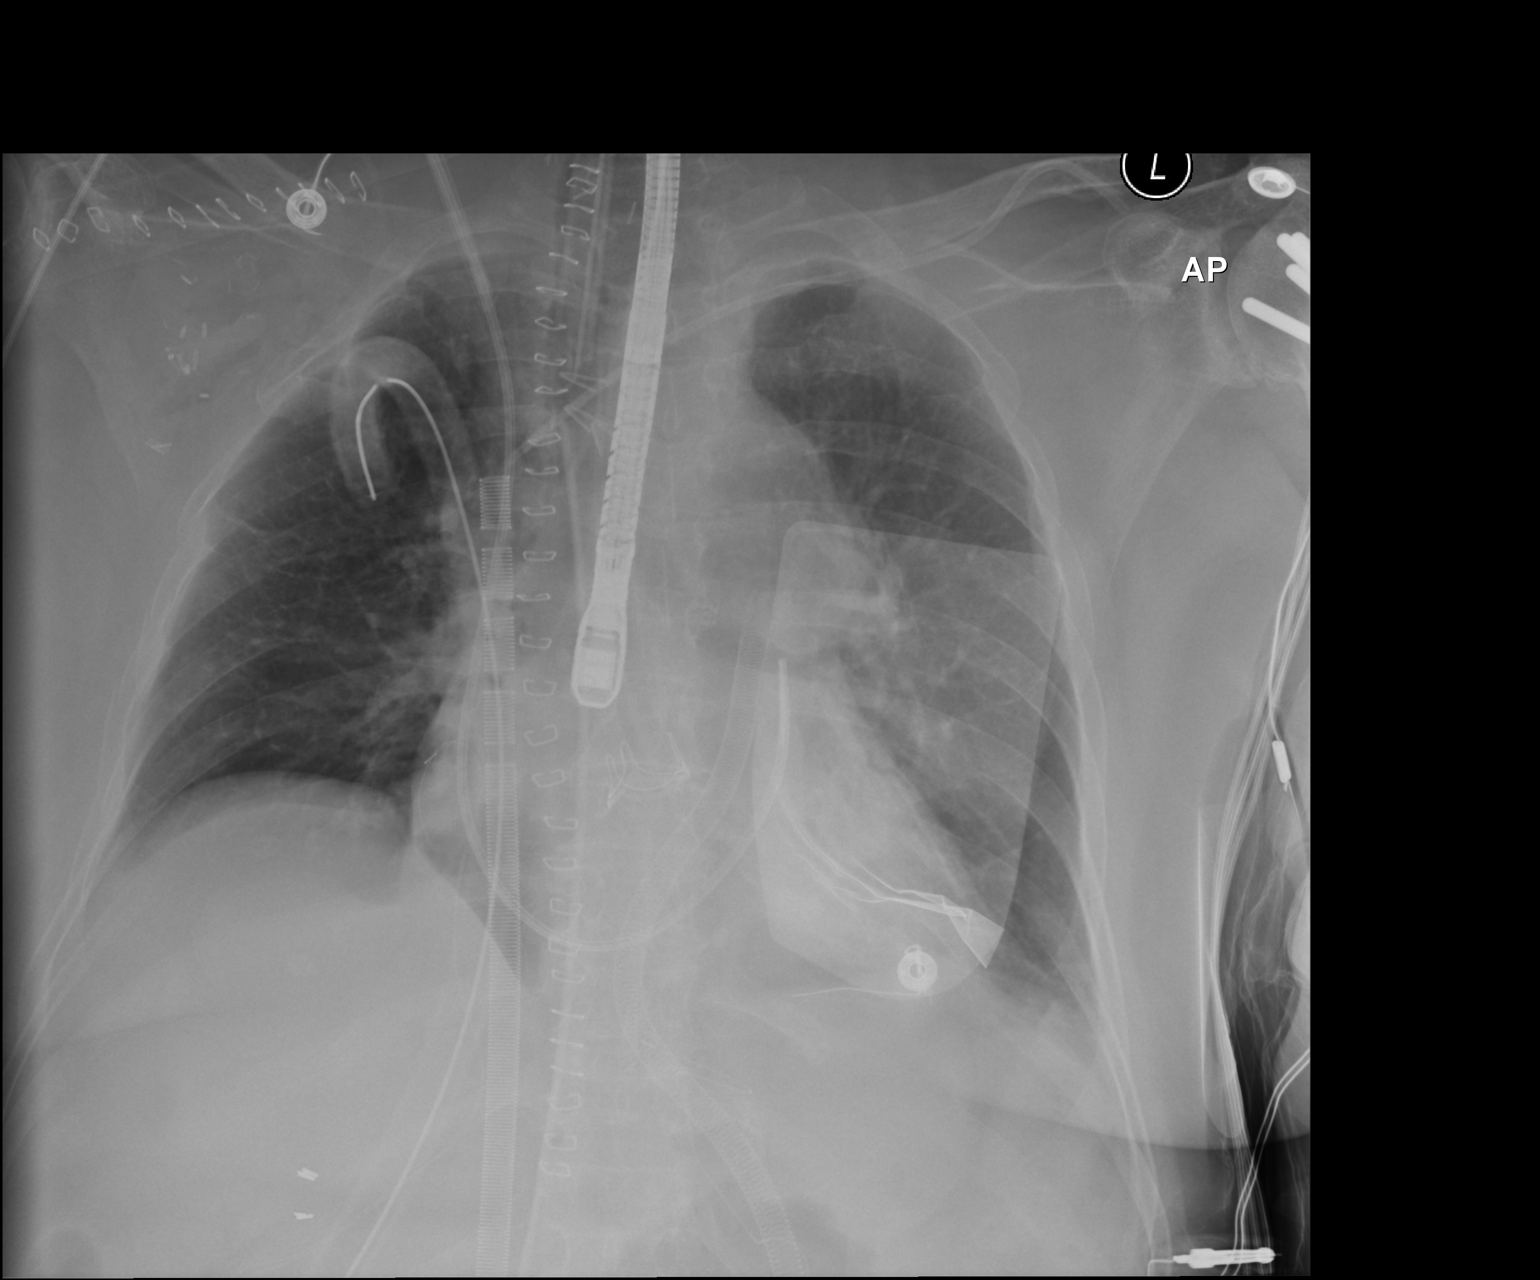

[1 of 1 positions shown; findings below may reference images not displayed]

FINDINGS: Stable cardiomegaly. Interval placement of right-sided chest tube
without pneumothorax. Endoscopy device is noted, presumably in mid
esophagus. Right internal jugular Swan-Ganz catheter is noted with
distal tip in expected position of main pulmonary artery. Status
post aortic valve repair. Minimal left basilar subsegmental
atelectasis is noted. Bony thorax is unremarkable. Endotracheal tube
is in grossly good position. Nasogastric tube appears to have been
removed. Interval placement of left subclavian catheter with distal
tip in expected position of SVC.
IMPRESSION: Right-sided chest tube is noted without pneumothorax. Interval
placement of left subclavian catheter with distal tip in expected
position of the SVC. Endoscopy device appears to be projected over
expected position of mid esophagus. Endotracheal tube and right
internal jugular Swan-Ganz catheter in good position.

## 2019-06-02 IMAGING — DX DG ABD PORTABLE 1V
1 series · 1 of 1 positions shown · non-contrast
Comparison: One-view chest x-ray 07/11/2016.

CLINICAL DATA: Feeding tube placement.

EXAM:
PORTABLE ABDOMEN - 1 VIEW

[abdomen]
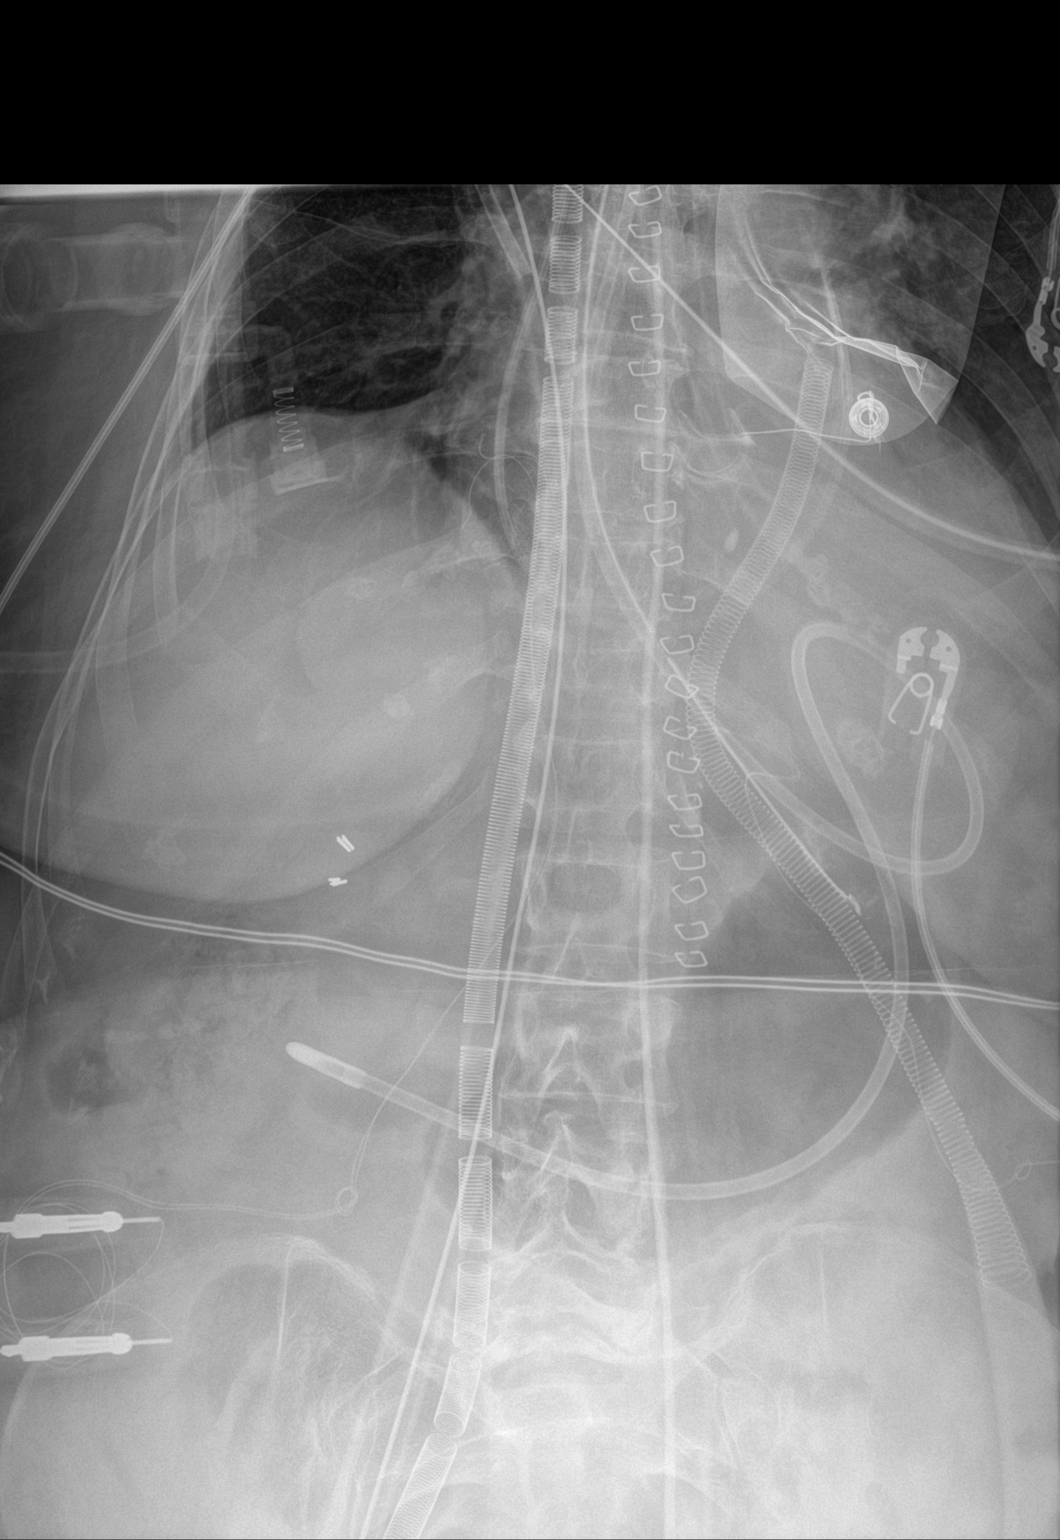

[1 of 1 positions shown; findings below may reference images not displayed]

FINDINGS: Tip of a small bore feeding tube is at the level of the duodenum.
There is some redundancy in the fundus of the stomach. Vascular
catheters are stable.
IMPRESSION: The tip of a small bore feeding tube is at the duodenum.

## 2019-06-20 DIAGNOSIS — Z930 Tracheostomy status: Secondary | ICD-10-CM | POA: Diagnosis not present

## 2019-06-24 ENCOUNTER — Ambulatory Visit: Payer: Self-pay | Admitting: Licensed Clinical Social Worker

## 2019-06-24 ENCOUNTER — Ambulatory Visit (INDEPENDENT_AMBULATORY_CARE_PROVIDER_SITE_OTHER): Payer: Medicare Other | Admitting: *Deleted

## 2019-06-24 ENCOUNTER — Other Ambulatory Visit: Payer: Self-pay

## 2019-06-24 DIAGNOSIS — E538 Deficiency of other specified B group vitamins: Secondary | ICD-10-CM

## 2019-06-24 DIAGNOSIS — F411 Generalized anxiety disorder: Secondary | ICD-10-CM

## 2019-06-24 DIAGNOSIS — K219 Gastro-esophageal reflux disease without esophagitis: Secondary | ICD-10-CM

## 2019-06-24 NOTE — Patient Instructions (Addendum)
Licensed Clinical Social Worker Visit Information  Goals we discussed today:  Goals Addressed            This Visit's Progress   . Client will talk  with [name] in next 30 days about managing anxiety issues for client (pt-stated)       Current Barriers:  . Client fatigues easily . Client has tracheostomy and has Chronic Diagnoses of CHF,GAD, Vitamin B12 deficiency, osteoarthritis and GERD   Clinical Social Work Clinical Goal(s):  . Client and [name] to communicate in next 30 days to discuss client management of anxiety issues of client  Interventions:  Previously [name] talked with [name] about nursing support with RNCM  Previously [name] talked with [name] about [name] support  [name] talked previously with [name] about her in home care needs of client  Talked with Lailyn previously about ambulation needs (she uses a cane to help her walk)  Talked with client previously about meal provision for client  Previously talked with client about relaxation techniques of choice (she likes to watch TV, or to sit on porch, talk on the phone with family members, likes to go out to eat)  Patient Self Care Activities:  . Takes medications as prescribed . Attends scheduled medical appointments  Plan:   Client to attend scheduled medical appointments [name] to call client in next 4 weeks to talk with her about anxiety symptoms management of client Client to call RNCM as needed to discuss nursing needs of client Client to use relaxation techniques of choice to manage anxiety or stress symptoms  Initial goal documentation         Materials Provided: No  Follow Up Plan: [name] to call client in next 4 weeks to talk with client about anxiety symptoms management for client  The patient verbalized understanding of instructions provided today and declined a print copy of patient instruction materials.   [name].Karolyne Timmons MSW, [name] Licensed Clinical Social Worker [facility] [phone]

## 2019-06-24 NOTE — Chronic Care Management (AMB) (Signed)
Care Management Note   [name] is a 78 y.o. year old female who is a primary care patient of [name], [name], MD. The CM team was consulted for assistance with chronic disease management and care coordination.   I reached out to [name] by phone today.   Review of patient status, including review of consultants reports, relevant laboratory and other test results, and collaboration with appropriate care team members and the patient's provider was performed as part of comprehensive patient evaluation and provision of chronic care management services.   Social determinants of health:risk of physical inactivity; risk of tobacco use; risk of depression    Chronic Care Management from 04/12/2019 in [facility]  PHQ-9 Total Score  5     GAD 7 : Generalized Anxiety Score 04/12/2019  Nervous, Anxious, on Edge 1  Control/stop worrying 1  Worry too much - different things 1  Trouble relaxing 1  Restless 0  Easily annoyed or irritable 0  Afraid - awful might happen 0  Total GAD 7 Score 4  Anxiety Difficulty Somewhat difficult   Medications   (very important)  New medications from outside sources are available for reconciliation  Outpatient Medications aspirin 81 MG chewable tablet calcium carbonate (OS-CAL - DOSED IN MG OF ELEMENTAL CALCIUM) 1250 (500 Ca) MG tablet clonazePAM (KLONOPIN) 0.25 MG disintegrating tablet furosemide (LASIX) 20 MG tablet hydrocortisone (ANUSOL-HC) 2.5 % rectal cream ketoconazole (NIZORAL) 2 % cream latanoprost (XALATAN) 0.005 % ophthalmic solution levothyroxine (SYNTHROID, LEVOTHROID) 100 MCG tablet levothyroxine (SYNTHROID, LEVOTHROID) 112 MCG tablet linaclotide (LINZESS) 72 MCG capsule loratadine (CLARITIN) 10 MG tablet magnesium oxide (MAG-OX) 400 (241.3 Mg) MG tablet Multiple Vitamin (MULTIVITAMIN WITH MINERALS) TABS tablet potassium chloride (KLOR-CON) 10 MEQ tablet QUEtiapine (SEROQUEL) 25 MG  tablet simvastatin (ZOCOR) 80 MG tablet venlafaxine (EFFEXOR) 37.5 MG tablet  Clinic-Administered Medications cyanocobalamin ((VITAMIN B-12)) injection 1,000 mcg   Goals Addressed            This Visit's Progress   . Client will talk  with [name] in next 30 days about managing anxiety issues for client (pt-stated)       Current Barriers:  . Client fatigues easily . Client has tracheostomy and has Chronic Diagnoses of CHF,GAD, Vitamin B12 deficiency, osteoarthritis and GERD   Clinical Social Work Clinical Goal(s):  . Client and [name] to communicate in next 30 days to discuss client management of anxiety issues of client  Interventions:  Previously [name] talked with [name] about nursing support with RNCM  Previously [name] talked with [name] about [name] support  [name] talked previously with [name] about her in home care needs of client   Talked with Zillie previously about ambulation needs (she uses a cane to help her  walk)  Talked with client previously about meal provision for client  Previously talked with client about relaxation techniques of choice (she likes to watch TV, or to sit on porch, talk on the phone with family members, likes to go out to eat)  Patient Self Care Activities:  . Takes medications as prescribed . Attends scheduled medical appointments  Plan:   Client to attend scheduled medical appointments [name] to call client in next 4 weeks to talk with her about anxiety symptoms management of client Client to call RNCM as needed to discuss nursing needs of client Client to use relaxation techniques of choice to manage anxiety or stress symptoms  Initial goal documentation       Follow Up Plan: [name]  to call client in next 4 weeks to talk with client about anxiety symptoms management for client  [name].Marlowe Lawes MSW, [name] Licensed Clinical Social Worker [facility] [phone]

## 2019-07-05 ENCOUNTER — Other Ambulatory Visit: Payer: Self-pay | Admitting: Family Medicine

## 2019-07-09 DIAGNOSIS — Z85850 Personal history of malignant neoplasm of thyroid: Secondary | ICD-10-CM | POA: Diagnosis not present

## 2019-07-09 DIAGNOSIS — E890 Postprocedural hypothyroidism: Secondary | ICD-10-CM | POA: Diagnosis not present

## 2019-07-10 ENCOUNTER — Ambulatory Visit (INDEPENDENT_AMBULATORY_CARE_PROVIDER_SITE_OTHER): Payer: Medicare Other

## 2019-07-10 DIAGNOSIS — Z13820 Encounter for screening for osteoporosis: Secondary | ICD-10-CM

## 2019-07-10 DIAGNOSIS — M810 Age-related osteoporosis without current pathological fracture: Secondary | ICD-10-CM | POA: Diagnosis not present

## 2019-07-10 DIAGNOSIS — Z780 Asymptomatic menopausal state: Secondary | ICD-10-CM | POA: Diagnosis not present

## 2019-07-10 DIAGNOSIS — M85852 Other specified disorders of bone density and structure, left thigh: Secondary | ICD-10-CM | POA: Diagnosis not present

## 2019-07-17 ENCOUNTER — Other Ambulatory Visit: Payer: Self-pay | Admitting: Family Medicine

## 2019-07-22 ENCOUNTER — Ambulatory Visit: Payer: Self-pay | Admitting: Licensed Clinical Social Worker

## 2019-07-22 DIAGNOSIS — F411 Generalized anxiety disorder: Secondary | ICD-10-CM

## 2019-07-22 DIAGNOSIS — E538 Deficiency of other specified B group vitamins: Secondary | ICD-10-CM

## 2019-07-22 DIAGNOSIS — Z930 Tracheostomy status: Secondary | ICD-10-CM

## 2019-07-22 DIAGNOSIS — K219 Gastro-esophageal reflux disease without esophagitis: Secondary | ICD-10-CM

## 2019-07-22 NOTE — Patient Instructions (Addendum)
Licensed Clinical Social Worker Visit Information  Goals we discussed today:  Goals    . Client will talk  with [name] in next 30 days about managing anxiety issues for client (pt-stated)     Current Barriers:  . Client fatigues easily . Client has tracheostomy and has Chronic Diagnoses of CHF,GAD, Vitamin B12 deficiency, osteoarthritis and GERD   Clinical Social Work Clinical Goal(s):  . Client and [name] to communicate in next 30 days to discuss client management of anxiety issues of client  Interventions: Previously [name] talked with [name] about [name] support  [name] talked previously with [name] about her in home care needs of client  Talked with Dashara previously about ambulation needs (she uses a cane to help her walk)  Talked with client previously about meal provision for client Previously talked with client about relaxation techniques of choice (she likes to watch TV, or to sit on porch, talk on the phone with family members, likes to go out to eat)  Patient Self Care Activities:  . Takes medications as prescribed . Attends scheduled medical appointments  Plan:   Client to attend scheduled medical appointments [name] to call client in next 4 weeks to talk with her about anxiety symptoms management of client Client to call RNCM as needed to discuss nursing needs of client Client to use relaxation techniques of choice to manage anxiety or stress symptoms  Initial goal documentation             Materials Provided: No  Follow Up Plan:  [name] to call client in next 4 weeks to talk with her about anxiety symptoms management for client  The patient verbalized understanding of instructions provided today and declined a print copy of patient instruction materials.   [name].Ileane Sando MSW, [name] Licensed Clinical Social Worker [facility] [phone]

## 2019-07-22 NOTE — Chronic Care Management (AMB) (Signed)
Care Management Note   [name] is a 79 y.o. year old female who is a primary care patient of [name], [name], MD. The CM team was consulted for assistance with chronic disease management and care coordination.   I reached out to [name] by phone today.    Review of patient status, including review of consultants reports, relevant laboratory and other test results, and collaboration with appropriate care team members and the patient's provider was performed as part of comprehensive patient evaluation and provision of chronic care management services.   Social determinants of health: risk of tobacco use; risk of depression; risk of physical inactivity    Chronic Care Management from 04/12/2019 in [facility]  PHQ-9 Total Score  5      GAD 7 : Generalized Anxiety Score 04/12/2019  Nervous, Anxious, on Edge 1  Control/stop worrying 1  Worry too much - different things 1  Trouble relaxing 1  Restless 0  Easily annoyed or irritable 0  Afraid - awful might happen 0  Total GAD 7 Score 4  Anxiety Difficulty Somewhat difficult   Medications   (very important)  New medications from outside sources are available for reconciliation  Outpatient Medications aspirin 81 MG chewable tablet calcium carbonate (OS-CAL - DOSED IN MG OF ELEMENTAL CALCIUM) 1250 (500 Ca) MG tablet clonazePAM (KLONOPIN) 0.25 MG disintegrating tablet furosemide (LASIX) 20 MG tablet hydrocortisone (ANUSOL-HC) 2.5 % rectal cream ketoconazole (NIZORAL) 2 % cream latanoprost (XALATAN) 0.005 % ophthalmic solution levothyroxine (SYNTHROID, LEVOTHROID) 100 MCG tablet levothyroxine (SYNTHROID, LEVOTHROID) 112 MCG tablet linaclotide (LINZESS) 72 MCG capsule loratadine (CLARITIN) 10 MG tablet magnesium oxide (MAG-OX) 400 (241.3 Mg) MG tablet Multiple Vitamin (MULTIVITAMIN WITH MINERALS) TABS tablet potassium chloride (KLOR-CON) 10 MEQ tablet QUEtiapine (SEROQUEL) 25 MG  tablet simvastatin (ZOCOR) 80 MG tablet venlafaxine (EFFEXOR) 37.5 MG tablet  Clinic-Administered Medications cyanocobalamin ((VITAMIN B-12)) injection 1,000 mcg   Goals    . Client will talk  with [name] in next 30 days about managing anxiety issues for client (pt-stated)     Current Barriers:  . Client fatigues easily . Client has tracheostomy and has Chronic Diagnoses of CHF,GAD, Vitamin B12 deficiency, osteoarthritis and GERD   Clinical Social Work Clinical Goal(s):  . Client and [name] to communicate in next 30 days to discuss client management of anxiety issues of client  Interventions:  PreviouslyLCSW talked with [name] about [name] about her in home care needs of client   Talked with Lindapreviouslyabout ambulation needs (she uses a cane to help her walk)  Talked with clientpreviouslyabout meal provision for client . Previously talked with client about relaxation techniques of choice (she likes to watch TV, or to sit on porch, talk on the phone with family members, likes to go out to eat)  Patient Self Care Activities:  . Takes medications as prescribed . Attends scheduled medical appointments  Plan:   Client to attend scheduled medical appointments [name] to call client in next 4 weeks to talk with her about anxiety symptoms management of client Client to call RNCM as needed to discuss nursing needs of client Client to use relaxation techniques of choice to manage anxiety or stress symptoms  Initial goal documentation      Follow Up Plan: [name] to call client in next 4 weeks to talk with her about anxiety symptoms management for client  [name].Johnathen Testa MSW, [name] Licensed Clinical Social Worker [facility] [phone]

## 2019-07-23 ENCOUNTER — Other Ambulatory Visit: Payer: Self-pay | Admitting: Family Medicine

## 2019-08-05 ENCOUNTER — Other Ambulatory Visit: Payer: Self-pay | Admitting: Family Medicine

## 2019-08-07 DIAGNOSIS — J3802 Paralysis of vocal cords and larynx, bilateral: Secondary | ICD-10-CM | POA: Diagnosis not present

## 2019-08-07 DIAGNOSIS — R0602 Shortness of breath: Secondary | ICD-10-CM | POA: Diagnosis not present

## 2019-08-07 DIAGNOSIS — R490 Dysphonia: Secondary | ICD-10-CM | POA: Diagnosis not present

## 2019-08-07 DIAGNOSIS — J386 Stenosis of larynx: Secondary | ICD-10-CM | POA: Diagnosis not present

## 2019-08-07 DIAGNOSIS — Z430 Encounter for attention to tracheostomy: Secondary | ICD-10-CM | POA: Diagnosis not present

## 2019-08-07 DIAGNOSIS — Z930 Tracheostomy status: Secondary | ICD-10-CM | POA: Diagnosis not present

## 2019-08-12 ENCOUNTER — Other Ambulatory Visit: Payer: Self-pay | Admitting: Family Medicine

## 2019-08-12 ENCOUNTER — Ambulatory Visit: Payer: Medicare Other | Admitting: Family Medicine

## 2019-08-13 ENCOUNTER — Other Ambulatory Visit: Payer: Self-pay

## 2019-08-14 ENCOUNTER — Ambulatory Visit: Payer: Medicare Other | Admitting: Family Medicine

## 2019-08-14 ENCOUNTER — Encounter: Payer: Self-pay | Admitting: Family Medicine

## 2019-08-14 VITALS — BP 124/72 | HR 96 | Temp 97.1°F | Ht 65.0 in | Wt 176.4 lb

## 2019-08-14 DIAGNOSIS — E7800 Pure hypercholesterolemia, unspecified: Secondary | ICD-10-CM | POA: Diagnosis not present

## 2019-08-14 DIAGNOSIS — I10 Essential (primary) hypertension: Secondary | ICD-10-CM

## 2019-08-14 DIAGNOSIS — F339 Major depressive disorder, recurrent, unspecified: Secondary | ICD-10-CM

## 2019-08-14 DIAGNOSIS — E538 Deficiency of other specified B group vitamins: Secondary | ICD-10-CM

## 2019-08-14 DIAGNOSIS — E559 Vitamin D deficiency, unspecified: Secondary | ICD-10-CM

## 2019-08-14 MED ORDER — CLONAZEPAM 0.25 MG PO TBDP: ORAL_TABLET | ORAL | 5 refills | Status: DC

## 2019-08-14 NOTE — Progress Notes (Signed)
BP 124/72   Pulse 96   Temp (!) 97.1 F (36.2 C) (Temporal)   Ht 5' 5" (1.651 m)   Wt 176 lb 6.4 oz (80 kg)   SpO2 98%   BMI 29.35 kg/m    Subjective:   Patient ID: [name], female    DOB: Jun 30, 1940, 79 y.o.   MRN: [id number]  HPI: [name] is a 79 y.o. female presenting on 08/14/2019 for Hyperlipidemia (31monthfollow up)   HPI Patient has a tracheostomy in place and does see her specialist for this and has had a lot of inflammation around it but is doing okay with it.  Hyperlipidemia Patient is coming in for recheck of his hyperlipidemia. The patient is currently taking simvastatin. They deny any issues with myalgias or history of liver damage from it. They deny any focal numbness or weakness or chest pain.   Hypertension Patient is currently on no medication currently, and their blood pressure today is 124/72. Patient denies any lightheadedness or dizziness. Patient denies headaches, blurred vision, chest pains, shortness of breath, or weakness. Denies any side effects from medication and is content with current medication.   Depression and anxiety and insomnia Current rx-clonazepam 0.25 mg 3 times daily as needed # meds rx-90 Effectiveness of current meds-working well Adverse reactions form meds-none  Pill count performed-No Last drug screen -N/A ( high risk q391mmoderate risk q6m63mow risk yearly ) Urine drug screen today- Yes [place]  If yes were their any concerning findings? -None  No flowsheet data found.   Controlled substance contract signed on: Today  Relevant past medical, surgical, family and social history reviewed and updated as indicated. Interim medical history since our last visit reviewed. Allergies and medications reviewed and updated.  Review of Systems  Constitutional: Negative for chills and fever.  HENT: Negative for congestion, ear discharge and ear pain.   Eyes: Negative for redness and visual disturbance.    Respiratory: Positive for shortness of breath (Sometimes because of trach and especially on exertion). Negative for chest tightness.   Cardiovascular: Negative for chest pain and leg swelling.  Genitourinary: Negative for difficulty urinating and dysuria.  Musculoskeletal: Negative for back pain and gait problem.  Skin: Negative for rash.  Neurological: Negative for light-headedness and headaches.  Psychiatric/Behavioral: Negative for agitation and behavioral problems.  All other systems reviewed and are negative.   Per HPI unless specifically indicated above   Allergies as of 08/14/2019      Reactions   Actonel [risedronate] Other (See Comments)   Bone pain and nausea   Lipitor [atorvastatin Calcium] Other (See Comments)   myalgias   Lyrica [pregabalin] Other (See Comments)   SORES IN MOUTH    Sibutramine Hcl Monohydrate Other (See Comments)   UNSPECIFIED REACTION    Alendronate Sodium Nausea Only   Latex Rash, Other (See Comments)   RA to latex 1982; includes bandaids    Levofloxacin Nausea And Vomiting   Meloxicam Other (See Comments)   Vaginal itching       Medication List       Accurate as of August 14, 2019 11:59 PM. If you have any questions, ask your nurse or doctor.        aspirin 81 MG chewable tablet Chew 81 mg by mouth at bedtime.   calcium carbonate 1250 (500 Ca) MG tablet Commonly known as: OS-CAL - dosed in mg of elemental calcium Take 1 tablet (500 mg of elemental calcium total)  by mouth daily with breakfast.   clonazePAM 0.25 MG disintegrating tablet Commonly known as: KLONOPIN DISSOLVE 1 TABLET BY MOUTH 3 TIMES A DAY   furosemide 20 MG tablet Commonly known as: LASIX Take 10 mg by mouth daily. 10 mg po  Mon/Wed/Fridays   hydrocortisone 2.5 % rectal cream Commonly known as: ANUSOL-HC APPLY RECTALLY 2 TIMES A DAY AS NEEDED   ketoconazole 2 % cream Commonly known as: NIZORAL Apply topically daily.   latanoprost 0.005 % ophthalmic  solution Commonly known as: XALATAN Place 1 drop into both eyes at bedtime.   levothyroxine 112 MCG tablet Commonly known as: SYNTHROID Take 112 mcg by mouth every other day. Alternates doses   levothyroxine 100 MCG tablet Commonly known as: SYNTHROID Take 100 mcg by mouth See admin instructions.   linaclotide 72 MCG capsule Commonly known as: Linzess Take 1 capsule (72 mcg total) by mouth daily before breakfast.   loratadine 10 MG tablet Commonly known as: CLARITIN Take 1 tablet (10 mg total) by mouth daily.   magnesium oxide 400 (241.3 Mg) MG tablet Commonly known as: MAG-OX Take 1 tablet (400 mg total) by mouth daily. What changed: when to take this   multivitamin with minerals Tabs tablet Take 1 tablet by mouth daily.   potassium chloride 10 MEQ tablet Commonly known as: KLOR-CON TAKE 2 TABLETS TWICE A DAY   QUEtiapine 25 MG tablet Commonly known as: SEROQUEL TAKE 3 TABLETS AT BEDTIME AS DIRECTED   simvastatin 80 MG tablet Commonly known as: ZOCOR TAKE 1 TABLET BY MOUTH DAILY.Marland Kitchen   venlafaxine 37.5 MG tablet Commonly known as: EFFEXOR Take 1 tablet (37.5 mg total) by mouth 2 (two) times daily.        Objective:   BP 124/72   Pulse 96   Temp (!) 97.1 F (36.2 C) (Temporal)   Ht 5' 5" (1.651 m)   Wt 176 lb 6.4 oz (80 kg)   SpO2 98%   BMI 29.35 kg/m   Wt Readings from Last 3 Encounters:  08/14/19 176 lb 6.4 oz (80 kg)  05/08/19 179 lb (81.2 kg)  04/11/19 174 lb 12.8 oz (79.3 kg)    Physical Exam Vitals and nursing note reviewed.  Constitutional:      General: She is not in acute distress.    Appearance: She is well-developed. She is not diaphoretic.     Comments: Tracheostomy in place  Eyes:     Conjunctiva/sclera: Conjunctivae normal.     Pupils: Pupils are equal, round, and reactive to light.  Cardiovascular:     Rate and Rhythm: Normal rate and regular rhythm.     Heart sounds: Murmur (Holosystolic murmur best heard at left and right second  intercostal border) present. Systolic murmur present with a grade of 3/6.  Pulmonary:     Effort: Pulmonary effort is normal. No respiratory distress.     Breath sounds: Normal breath sounds. No wheezing.  Musculoskeletal:        General: No tenderness. Normal range of motion.  Skin:    General: Skin is warm and dry.     Findings: No rash.  Neurological:     Mental Status: She is alert and oriented to person, place, and time.     Coordination: Coordination normal.  Psychiatric:        Behavior: Behavior normal.       Assessment & Plan:   Problem List Items Addressed This Visit      Cardiovascular and Mediastinum   Essential  hypertension - Primary   Relevant Orders   CBC with Differential/Platelet (Completed)     Other   Depression, recurrent ([place])   Relevant Orders   ToxASSURE Select 28 (MW), Urine   Vitamin B 12 deficiency   Relevant Orders   CBC with Differential/Platelet (Completed)   CMP14+EGFR (Completed)   Pure hypercholesterolemia   Relevant Orders   CBC with Differential/Platelet (Completed)   CMP14+EGFR (Completed)   Lipid panel (Completed)   Vitamin D deficiency   Relevant Orders   CBC with Differential/Platelet (Completed)   CMP14+EGFR (Completed)      No change in medication, will check some blood work because it has been a while.  She continues to manage her trach with her specialist surgeon who put it in. Follow up plan: Return in about 6 months (around 02/11/2020), or if symptoms worsen or fail to improve, for Hyperlipidemia recheck.  Counseling provided for all of the vaccine components Orders Placed This Encounter  Procedures  . ToxASSURE Select 13 (MW), Urine  . CBC with Differential/Platelet  . CMP14+EGFR  . Lipid panel    Caryl Pina, MD [facility] 08/16/2019, 12:59 PM

## 2019-08-15 LAB — CMP14+EGFR
ALT: 8 IU/L (ref 0–32)
AST: 22 IU/L (ref 0–40)
Albumin/Globulin Ratio: 2.1 (ref 1.2–2.2)
Albumin: 4.4 g/dL (ref 3.7–4.7)
Alkaline Phosphatase: 113 IU/L (ref 39–117)
BUN/Creatinine Ratio: 16 (ref 12–28)
BUN: 14 mg/dL (ref 8–27)
Bilirubin Total: 0.5 mg/dL (ref 0.0–1.2)
CO2: 25 mmol/L (ref 20–29)
Calcium: 8.8 mg/dL (ref 8.7–10.3)
Chloride: 103 mmol/L (ref 96–106)
Creatinine, Ser: 0.89 mg/dL (ref 0.57–1.00)
GFR calc Af Amer: 72 mL/min/{1.73_m2} (ref >59)
GFR calc non Af Amer: 63 mL/min/{1.73_m2} (ref >59)
Globulin, Total: 2.1 g/dL (ref 1.5–4.5)
Glucose: 88 mg/dL (ref 65–99)
Potassium: 4.5 mmol/L (ref 3.5–5.2)
Sodium: 142 mmol/L (ref 134–144)
Total Protein: 6.5 g/dL (ref 6.0–8.5)

## 2019-08-15 LAB — LIPID PANEL
Chol/HDL Ratio: 2.4 ratio (ref 0.0–4.4)
Cholesterol, Total: 160 mg/dL (ref 100–199)
HDL: 66 mg/dL (ref >39)
LDL Chol Calc (NIH): 75 mg/dL (ref 0–99)
Triglycerides: 108 mg/dL (ref 0–149)
VLDL Cholesterol Cal: 19 mg/dL (ref 5–40)

## 2019-08-15 LAB — CBC WITH DIFFERENTIAL/PLATELET
Basophils Absolute: 0 10*3/uL (ref 0.0–0.2)
Basos: 1 %
EOS (ABSOLUTE): 0.1 10*3/uL (ref 0.0–0.4)
Eos: 2 %
Hematocrit: 33.7 % — ABNORMAL LOW (ref 34.0–46.6)
Hemoglobin: 11.6 g/dL (ref 11.1–15.9)
Immature Grans (Abs): 0 10*3/uL (ref 0.0–0.1)
Immature Granulocytes: 0 %
Lymphocytes Absolute: 0.7 10*3/uL (ref 0.7–3.1)
Lymphs: 10 %
MCH: 29.9 pg (ref 26.6–33.0)
MCHC: 34.4 g/dL (ref 31.5–35.7)
MCV: 87 fL (ref 79–97)
Monocytes Absolute: 0.5 10*3/uL (ref 0.1–0.9)
Monocytes: 8 %
Neutrophils Absolute: 5.6 10*3/uL (ref 1.4–7.0)
Neutrophils: 79 %
Platelets: 200 10*3/uL (ref 150–450)
RBC: 3.88 x10E6/uL (ref 3.77–5.28)
RDW: 13.5 % (ref 11.7–15.4)
WBC: 6.9 10*3/uL (ref 3.4–10.8)

## 2019-08-16 LAB — TOXASSURE SELECT 13 (MW), URINE

## 2019-08-20 ENCOUNTER — Ambulatory Visit: Payer: Self-pay | Admitting: Licensed Clinical Social Worker

## 2019-08-20 DIAGNOSIS — E538 Deficiency of other specified B group vitamins: Secondary | ICD-10-CM

## 2019-08-20 DIAGNOSIS — F411 Generalized anxiety disorder: Secondary | ICD-10-CM

## 2019-08-20 DIAGNOSIS — K219 Gastro-esophageal reflux disease without esophagitis: Secondary | ICD-10-CM

## 2019-08-20 NOTE — Patient Instructions (Addendum)
Licensed Clinical Social Worker Visit Information  Goals we discussed today:  Goals    . Client will talk  with [name] in next 30 days about managing anxiety issues for client (pt-stated)     Current Barriers:  . Client fatigues easily . Client has tracheostomy and has Chronic Diagnoses of CHF,GAD, Vitamin B12 deficiency, osteoarthritis and GERD   Clinical Social Work Clinical Goal(s):  . Client and [name] to communicate in next 30 days to discuss client management of anxiety issues of client  Interventions: Previously [name] talked with [name] about [name] support  [name] talked previously with [name] about her in home care needs of client  Talked with Tuere previously about ambulation needs (she uses a cane to help her walk)  Talked with client previously about meal provision for client Previously talked with client about relaxation techniques of choice (she likes to watch TV, or to sit on porch, talk on the phone with family members, likes to go out to eat)  Patient Self Care Activities:  . Takes medications as prescribed . Attends scheduled medical appointments  Plan:   Client to attend scheduled medical appointments [name] to call client in next 4 weeks to talk with her about anxiety symptoms management of client Client to call RNCM as needed to discuss nursing needs of client Client to use relaxation techniques of choice to manage anxiety or stress symptoms  Initial goal documentation        Materials Provided:  No  Follow Up Plan: [name] to call client in next 4 weeks to talk with her about anxiety symptoms management for client  The patient verbalized understanding of instructions provided today and declined a print copy of patient instruction materials.   [name].Benjy Kana MSW, [name] Licensed Clinical Social Worker [facility] [phone]

## 2019-08-20 NOTE — Chronic Care Management (AMB) (Signed)
  Care Management Note   [name] is a 79 y.o. year old female who is a primary care patient of [name], [name], MD. The CM team was consulted for assistance with chronic disease management and care coordination.   I reached out to [name] by phone today   Review of patient status, including review of consultants reports, relevant laboratory and other test results, and collaboration with appropriate care team members and the patient's provider was performed as part of comprehensive patient evaluation and provision of chronic care management services.   Social determinants of health: risk of tobacco use; risk of depression; risk of physical inactivity    Chronic Care Management from 04/12/2019 in [facility]  PHQ-9 Total Score  5     GAD 7 : Generalized Anxiety Score 04/12/2019  Nervous, Anxious, on Edge 1  Control/stop worrying 1  Worry too much - different things 1  Trouble relaxing 1  Restless 0  Easily annoyed or irritable 0  Afraid - awful might happen 0  Total GAD 7 Score 4  Anxiety Difficulty Somewhat difficult   Medications   Outpatient Medications aspirin 81 MG chewable tablet calcium carbonate (OS-CAL - DOSED IN MG OF ELEMENTAL CALCIUM) 1250 (500 Ca) MG tablet clonazePAM (KLONOPIN) 0.25 MG disintegrating tablet furosemide (LASIX) 20 MG tablet hydrocortisone (ANUSOL-HC) 2.5 % rectal cream ketoconazole (NIZORAL) 2 % cream latanoprost (XALATAN) 0.005 % ophthalmic solution levothyroxine (SYNTHROID, LEVOTHROID) 100 MCG tablet levothyroxine (SYNTHROID, LEVOTHROID) 112 MCG tablet linaclotide (LINZESS) 72 MCG capsule loratadine (CLARITIN) 10 MG tablet magnesium oxide (MAG-OX) 400 (241.3 Mg) MG tablet Multiple Vitamin (MULTIVITAMIN WITH MINERALS) TABS tablet potassium chloride (KLOR-CON) 10 MEQ tablet QUEtiapine (SEROQUEL) 25 MG tablet simvastatin (ZOCOR) 80 MG tablet venlafaxine (EFFEXOR) 37.5 MG tablet  Clinic-Administered  Medications cyanocobalamin ((VITAMIN B-12)) injection 1,000 mcg  Goals    . Client will talk  with [name] in next 30 days about managing anxiety issues for client (pt-stated)     Current Barriers:  . Client fatigues easily . Client has tracheostomy and has Chronic Diagnoses of CHF,GAD, Vitamin B12 deficiency, osteoarthritis and GERD   Clinical Social Work Clinical Goal(s):  . Client and [name] to communicate in next 30 days to discuss client management of anxiety issues of client  Interventions:   PreviouslyLCSW talked with [name] about [name] support  [name] about her in home care needs of client   Talked with Lindapreviouslyabout ambulation needs (she uses a cane to help her walk)  Talked with clientpreviouslyabout meal provision for client  Previously talked with client about relaxation techniques of choice (she likes to watch TV, or to sit on porch, talk on the phone with family members, likes to go out to eat)   Patient Self Care Activities:  . Takes medications as prescribed . Attends scheduled medical appointments  Plan:   Client to attend scheduled medical appointments [name] to call client in next 4 weeks to talk with her about anxiety symptoms management of client Client to call RNCM as needed to discuss nursing needs of client Client to use relaxation techniques of choice to manage anxiety or stress symptoms  Initial goal documentation       Follow Up Plan: [name] to call client in next 4 weeks to talk with client about anxiety symptoms management  [name].Eveleen Mcnear MSW, [name] Licensed Clinical Social Worker [facility] [phone]

## 2019-08-21 DIAGNOSIS — E890 Postprocedural hypothyroidism: Secondary | ICD-10-CM | POA: Diagnosis not present

## 2019-08-23 ENCOUNTER — Other Ambulatory Visit: Payer: Self-pay | Admitting: Family Medicine

## 2019-09-03 ENCOUNTER — Other Ambulatory Visit: Payer: Self-pay | Admitting: Family Medicine

## 2019-09-09 ENCOUNTER — Other Ambulatory Visit: Payer: Self-pay | Admitting: Cardiothoracic Surgery

## 2019-09-09 DIAGNOSIS — I712 Thoracic aortic aneurysm, without rupture, unspecified: Secondary | ICD-10-CM

## 2019-09-10 DIAGNOSIS — H40002 Preglaucoma, unspecified, left eye: Secondary | ICD-10-CM | POA: Diagnosis not present

## 2019-09-10 DIAGNOSIS — H43811 Vitreous degeneration, right eye: Secondary | ICD-10-CM | POA: Diagnosis not present

## 2019-09-10 DIAGNOSIS — H401111 Primary open-angle glaucoma, right eye, mild stage: Secondary | ICD-10-CM | POA: Diagnosis not present

## 2019-09-17 ENCOUNTER — Ambulatory Visit: Payer: Self-pay | Admitting: Licensed Clinical Social Worker

## 2019-09-17 DIAGNOSIS — F411 Generalized anxiety disorder: Secondary | ICD-10-CM

## 2019-09-17 DIAGNOSIS — E538 Deficiency of other specified B group vitamins: Secondary | ICD-10-CM

## 2019-09-17 DIAGNOSIS — K219 Gastro-esophageal reflux disease without esophagitis: Secondary | ICD-10-CM

## 2019-09-17 DIAGNOSIS — Z930 Tracheostomy status: Secondary | ICD-10-CM

## 2019-09-17 NOTE — Patient Instructions (Addendum)
Licensed Clinical Social Worker Visit Information  Goals we discussed today:  Goals    . Client will talk  with [name] in next 30 days about managing anxiety issues for client (pt-stated)     Current Barriers:  . Client fatigues easily . Client has tracheostomy and has Chronic Diagnoses of CHF,GAD, Vitamin B12 deficiency, osteoarthritis and GERD   Clinical Social Work Clinical Goal(s):  . Client and [name] to communicate in next 30 days to discuss client management of anxiety issues of client  Interventions: Previously [name] talked with [name] about [name] support  [name] talked previously with [name] about her in home care needs of client  Talked with Samaa previously about ambulation needs (she uses a cane to help her walk)  Talked with client previously about meal provision for client Previously talked with client about relaxation techniques of choice (she likes to watch TV, or to sit on porch, talk on the phone with family members, likes to go out to eat)  Patient Self Care Activities:  . Takes medications as prescribed . Attends scheduled medical appointments  Plan:   Client to attend scheduled medical appointments [name] to call client in next 4 weeks to talk with her about anxiety symptoms management of client Client to call RNCM as needed to discuss nursing needs of client Client to use relaxation techniques of choice to manage anxiety or stress symptoms  Initial goal documentation     Materials Provided: No  Follow Up Plan: [name] to call client in next 4 weeks to talk with her about anxiety symptoms management for client  The patient verbalized understanding of instructions provided today and declined a print copy of patient instruction materials.   [name].Latalia Etzler MSW, [name] Licensed Clinical Social Worker [facility] [phone]

## 2019-09-17 NOTE — Chronic Care Management (AMB) (Signed)
Care Management Note   [name] is a 79 y.o. year old female who is a primary care patient of [name], [name], MD. The CM team was consulted for assistance with chronic disease management and care coordination.   I reached out to [name] by phone today.   Review of patient status, including review of consultants reports, relevant laboratory and other test results, and collaboration with appropriate care team members and the patient's provider was performed as part of comprehensive patient evaluation and provision of chronic care management services.   Social determinants of health: risk of tobacco use; risk of depression; risk of physical inactivity    Chronic Care Management from 04/12/2019 in [facility]  PHQ-9 Total Score  5     GAD 7 : Generalized Anxiety Score 04/12/2019  Nervous, Anxious, on Edge 1  Control/stop worrying 1  Worry too much - different things 1  Trouble relaxing 1  Restless 0  Easily annoyed or irritable 0  Afraid - awful might happen 0  Total GAD 7 Score 4  Anxiety Difficulty Somewhat difficult   Medications   (very important)  New medications from outside sources are available for reconciliation  Outpatient Medications aspirin 81 MG chewable tablet calcium carbonate (OS-CAL - DOSED IN MG OF ELEMENTAL CALCIUM) 1250 (500 Ca) MG tablet clonazePAM (KLONOPIN) 0.25 MG disintegrating tablet furosemide (LASIX) 20 MG tablet hydrocortisone (ANUSOL-HC) 2.5 % rectal cream ketoconazole (NIZORAL) 2 % cream latanoprost (XALATAN) 0.005 % ophthalmic solution levothyroxine (SYNTHROID, LEVOTHROID) 100 MCG tablet levothyroxine (SYNTHROID, LEVOTHROID) 112 MCG tablet linaclotide (LINZESS) 72 MCG capsule loratadine (CLARITIN) 10 MG tablet magnesium oxide (MAG-OX) 400 (241.3 Mg) MG tablet Multiple Vitamin (MULTIVITAMIN WITH MINERALS) TABS tablet potassium chloride (KLOR-CON) 10 MEQ tablet QUEtiapine (SEROQUEL) 25 MG  tablet simvastatin (ZOCOR) 80 MG tablet venlafaxine (EFFEXOR) 37.5 MG tablet  Clinic-Administered Medications cyanocobalamin ((VITAMIN B-12)) injection 1,000 mcg  Goals    . Client will talk  with [name] in next 30 days about managing anxiety issues for client (pt-stated)     Current Barriers:  . Client fatigues easily . Client has tracheostomy and has Chronic Diagnoses of CHF,GAD, Vitamin B12 deficiency, osteoarthritis and GERD   Clinical Social Work Clinical Goal(s):  . Client and [name] to communicate in next 30 days to discuss client management of anxiety issues of client  Interventions:  PreviouslyLCSW talked with [name] about [name] support  [employer] about her in home care needs of client   Talked with Lindapreviouslyabout ambulation needs (she uses a cane to help her walk)  Talked with clientpreviouslyabout meal provision for client  Previously talked with client about relaxation techniques of choice (she likes to watch TV, or to sit on porch, talk on the phone with family members, likes to go out to eat)  Patient Self Care Activities:  . Takes medications as prescribed . Attends scheduled medical appointments  Plan:   Client to attend scheduled medical appointments [name] to call client in next 4 weeks to talk with her about anxiety symptoms management of client Client to call RNCM as needed to discuss nursing needs of client Client to use relaxation techniques of choice to manage anxiety or stress symptoms  Initial goal documentation           Follow Up Plan: [name] to call client in next 4 weeks to talk with client about anxiety symptoms management of client  [name].Khrystal Jeanmarie MSW, [name] Licensed Clinical Social Worker [facility] [phone]

## 2019-09-19 ENCOUNTER — Other Ambulatory Visit: Payer: Self-pay | Admitting: Family Medicine

## 2019-09-25 DIAGNOSIS — I872 Venous insufficiency (chronic) (peripheral): Secondary | ICD-10-CM | POA: Diagnosis not present

## 2019-09-25 DIAGNOSIS — L570 Actinic keratosis: Secondary | ICD-10-CM | POA: Diagnosis not present

## 2019-09-25 DIAGNOSIS — X32XXXA Exposure to sunlight, initial encounter: Secondary | ICD-10-CM | POA: Diagnosis not present

## 2019-10-03 ENCOUNTER — Other Ambulatory Visit: Payer: Self-pay | Admitting: Family Medicine

## 2019-10-03 NOTE — Telephone Encounter (Signed)
Last office visit 08/14/2019 Last refill 09/03/2019, #90, no refills

## 2019-10-04 ENCOUNTER — Ambulatory Visit (INDEPENDENT_AMBULATORY_CARE_PROVIDER_SITE_OTHER): Payer: Medicare Other

## 2019-10-04 ENCOUNTER — Other Ambulatory Visit: Payer: Self-pay

## 2019-10-04 DIAGNOSIS — E538 Deficiency of other specified B group vitamins: Secondary | ICD-10-CM

## 2019-10-04 NOTE — Progress Notes (Signed)
Cyanocobalamin injection given to right deltoid.  Patient tolerated well. 

## 2019-10-07 ENCOUNTER — Ambulatory Visit (HOSPITAL_COMMUNITY)
Admission: RE | Admit: 2019-10-07 | Discharge: 2019-10-07 | Disposition: A | Discharge status: Home / Self Care | Payer: Medicare Other | Source: Ambulatory Visit | Attending: Cardiothoracic Surgery | Admitting: Cardiothoracic Surgery

## 2019-10-07 ENCOUNTER — Other Ambulatory Visit: Payer: Self-pay

## 2019-10-07 DIAGNOSIS — I712 Thoracic aortic aneurysm, without rupture, unspecified: Secondary | ICD-10-CM

## 2019-10-07 NOTE — Progress Notes (Signed)
*  PRELIMINARY RESULTS* Echocardiogram 2D Echocardiogram has been performed.  [name] 10/07/2019, 10:35 AM

## 2019-10-15 ENCOUNTER — Other Ambulatory Visit: Payer: Self-pay

## 2019-10-15 ENCOUNTER — Encounter: Payer: Self-pay | Admitting: Cardiothoracic Surgery

## 2019-10-15 ENCOUNTER — Ambulatory Visit: Payer: Medicare Other | Admitting: Cardiothoracic Surgery

## 2019-10-15 VITALS — BP 123/76 | HR 84 | Temp 97.9°F | Resp 20 | Ht 65.0 in | Wt 182.4 lb

## 2019-10-15 DIAGNOSIS — Z9889 Other specified postprocedural states: Secondary | ICD-10-CM | POA: Diagnosis not present

## 2019-10-15 DIAGNOSIS — I712 Thoracic aortic aneurysm, without rupture, unspecified: Secondary | ICD-10-CM

## 2019-10-15 DIAGNOSIS — Z952 Presence of prosthetic heart valve: Secondary | ICD-10-CM | POA: Diagnosis not present

## 2019-10-15 MED ORDER — AZITHROMYCIN 250 MG PO TABS: ORAL_TABLET | ORAL | 0 refills | Status: DC

## 2019-10-15 NOTE — Progress Notes (Signed)
PCP is [name], [name], MD Referring Provider is [name], [name], MD  Chief Complaint  Patient presents with  . Routine Post Op    1 yr f/u with an ECHO 10/07/19    HPI:  Routine follow-up 5 years after [place] procedure for aortic root and ascending aneurysm.  Patient is doing well.  She still has a permanent tracheostomy which is needed for scar tissue in the proximal trachea related to her postop intubation.  Her echocardiogram last week shows normal biventricular function, prosthetic aortic valve functioning well, and mild tricuspid insufficiency.  She is in sinus rhythm.  She has no symptoms of heart failure.  She is now quite adept at her trach care and checks in with her ENT surgeon every 4 months for change out of the tracheostomy appliance.  Past Medical History:  Diagnosis Date  . ACL tear    right  Dr. Gladstone Pih   . Adenomatous polyp   . Anxiety   . Arthritis    "all over" (04/12/2018)  . Ascending aortic aneurysm ([place])    note per chart per Dr Lucianne Lei Tright 4.7 cm 04/15/2015   . Asthma   . B12 deficiency   . Chronic bronchitis ([place])   . Depression   . DUB (dysfunctional uterine bleeding) 10/96  . Fibromyalgia   . GERD (gastroesophageal reflux disease)   . Glaucoma    bilaterally  . Helicobacter pylori (H. pylori)   . Hiatal hernia   . History of blood transfusion 06/2017   "related to OHS"  . History of kidney stones   . History of left shoulder fracture    pt states fell off bed and broke ball in shoulder had rod placed   . Hyperlipidemia   . Hypertension   . Hypothyroidism   . Inspiratory stridor   . Laryngeal stenosis   . Occasional tremors    left arm   . Osteoarthritis   . Osteopenia   . Osteoporosis   . Thyroid cancer ([place]) 2017  . Thyroid nodule   . Tracheostomy in place [facility])     Past Surgical History:  Procedure Laterality Date  . AORTIC VALVE REPLACEMENT N/A 07/10/2017   Procedure: AORTIC VALVE REPLACEMENT (AVR);  Surgeon: [name],  Collier Salina, MD;  Location: [facility];  Service: Open Heart Surgery;  Laterality: N/A;  . APPENDECTOMY    . BUNIONECTOMY  08/1999   right - Dr. Irving Shows   . CARDIAC VALVE REPLACEMENT    . CATARACT EXTRACTION, BILATERAL Bilateral 05/28/07-2009   "right-left"  . CHOLECYSTECTOMY OPEN  1979  . DILATION AND CURETTAGE OF UTERUS  03/31/95   Dr. Ovid Curd   . DIRECT LARYNGOSCOPY WITH BOTOX INJECTION N/A 12/01/2017   Procedure: SUSPENDED DIRECT MICROLARYNGOSCOPY WITH KENALOG INJECTION, CO2 LASER;  Surgeon: Melida Quitter, MD;  Location: [facility];  Service: ENT;  Laterality: N/A;  . EXCISIONAL HEMORRHOIDECTOMY    . EXPLORATION POST OPERATIVE OPEN HEART N/A 07/11/2017   Procedure: EXPLORATION POST OPERATIVE OPEN HEART;  Surgeon: Ivin Poot, MD;  Location: [facility];  Service: Open Heart Surgery;  Laterality: N/A;  . EYE SURGERY     also had left cataract removed   . FRACTURE SURGERY    . HEMATOMA EVACUATION N/A 07/11/2017   Procedure: EVACUATION HEMATOMA;  Surgeon: Ivin Poot, MD;  Location: [facility];  Service: Open Heart Surgery;  Laterality: N/A;  . HERNIA REPAIR    . JOINT REPLACEMENT    . MICROLARYNGOSCOPY WITH LASER AND BALLOON DILATION N/A  01/24/2018   Procedure: MICROLARYNGOSCOPY WITH LASER AND BALLOON DILATION;  Surgeon: Melida Quitter, MD;  Location: [facility];  Service: ENT;  Laterality: N/A;  . MICROLARYNGOSCOPY WITH LASER AND BALLOON DILATION N/A 04/13/2018   Procedure: MICROLARYNGOSCOPY WITH BALLOON DILATION;  Surgeon: Melida Quitter, MD;  Location: [facility] OR;  Service: ENT;  Laterality: N/A;  . NEPHROLITHOTOMY Left 04/07/2017   Procedure: NEPHROLITHOTOMY PERCUTANEOUS WITH SURGEON ACCESS;  Surgeon: Ardis Hughs, MD;  [facility];  Service: Urology;  Laterality: Left;  . ORIF HUMERUS FRACTURE  05/30/2011   Procedure: OPEN REDUCTION INTERNAL FIXATION (ORIF) PROXIMAL HUMERUS FRACTURE;  Surgeon: Augustin Schooling;  Location: [facility];  Service: Orthopedics;  Laterality: Left;  open reduction internal fixation of  proximal humerus fracture  . PLACEMENT OF CENTRIMAG VENTRICULAR ASSIST DEVICE Right 07/11/2017   Procedure: PLACEMENT OF CENTRIMAG VENTRICULAR ASSIST DEVICE;  Surgeon: Ivin Poot, MD;  Location: [facility];  Service: Open Heart Surgery;  Laterality: Right;  . REMOVAL OF CENTRIMAG VENTRICULAR ASSIST DEVICE N/A 07/17/2017   Procedure: REMOVAL OF RVAD WITH PUMP STANDBY;  Surgeon: Ivin Poot, MD;  Location: [facility];  Service: Open Heart Surgery;  Laterality: N/A;  . REPLACEMENT ASCENDING AORTA N/A 07/10/2017   Procedure: REPLACEMENT ASCENDING AORTA;  Surgeon: Ivin Poot, MD;  Location: [facility];  Service: Open Heart Surgery;  Laterality: N/A;  . RIGHT/LEFT HEART CATH AND CORONARY ANGIOGRAPHY N/A 06/13/2017   Procedure: RIGHT/LEFT HEART CATH AND CORONARY ANGIOGRAPHY;  Surgeon: Jolaine Artist, MD;  Location: [facility];  Service: Cardiovascular;  Laterality: N/A;  . TEE WITHOUT CARDIOVERSION N/A 07/10/2017   Procedure: TRANSESOPHAGEAL ECHOCARDIOGRAM (TEE);  Surgeon: [name], Collier Salina, MD;  Location: [facility];  Service: Open Heart Surgery;  Laterality: N/A;  . TEE WITHOUT CARDIOVERSION  07/11/2017   Procedure: TRANSESOPHAGEAL ECHOCARDIOGRAM (TEE);  Surgeon: [name], Collier Salina, MD;  Location: [facility];  Service: Open Heart Surgery;;  . TEE WITHOUT CARDIOVERSION N/A 07/17/2017   Procedure: TRANSESOPHAGEAL ECHOCARDIOGRAM (TEE);  Surgeon: [name], Collier Salina, MD;  Location: [facility];  Service: Open Heart Surgery;  Laterality: N/A;  . THYROID LOBECTOMY Left 07/27/2015   Procedure: LEFT THYROID LOBECTOMY;  Surgeon: Fanny Skates, MD;  [facility];  Service: General;  Laterality: Left;  . THYROIDECTOMY N/A 08/06/2015   Procedure: RIGHT THYROID LOBECTOMY, REIMPLANTATION PARATHYROID;  Surgeon: Fanny Skates, MD;  [facility];  Service: General;  Laterality: N/A;  . TOTAL KNEE ARTHROPLASTY Right 08/2009   Dr. Gladstone Pih  . TRACHEOSTOMY  08/08/2017  . VENTRAL HERNIA REPAIR  2/99    Family History   Problem Relation Age of Onset  . Cancer Mother        PANCREATIC   . Osteoporosis Mother   . Hip fracture Mother   . Heart attack Father 63       Died 68  . Heart disease Father   . Arthritis Father   . Hypertension Sister   . Cancer Sister        skin  . Hypertension Sister   . Cancer Sister        breast cancer  . Breast cancer Neg Hx     Social History Social History   Tobacco Use  . Smoking status: Never Smoker  . Smokeless tobacco: Never Used  Substance Use Topics  . Alcohol use: No  . Drug use: Never    Current Outpatient Medications  Medication Sig Dispense Refill  . aspirin 81 MG chewable tablet Chew 81 mg  by mouth at bedtime.     . calcium carbonate (OS-CAL - DOSED IN MG OF ELEMENTAL CALCIUM) 1250 (500 Ca) MG tablet Take 1 tablet (500 mg of elemental calcium total) by mouth daily with breakfast. 30 tablet 0  . clonazePAM (KLONOPIN) 0.25 MG disintegrating tablet DISSOLVE 1 TABLET BY MOUTH 3 TIMES A DAY 90 tablet 5  . furosemide (LASIX) 20 MG tablet Take 10 mg by mouth daily. 10 mg po  Mon/Wed/Fridays    . hydrocortisone (ANUSOL-HC) 2.5 % rectal cream APPLY RECTALLY 2 TIMES A DAY AS NEEDED 30 g 0  . ketoconazole (NIZORAL) 2 % cream Apply topically daily. 15 g 0  . latanoprost (XALATAN) 0.005 % ophthalmic solution Place 1 drop into both eyes at bedtime. 2.5 mL 12  . levothyroxine (SYNTHROID) 88 MCG tablet Take 88 mcg by mouth daily before breakfast.    . linaclotide (LINZESS) 72 MCG capsule Take 1 capsule (72 mcg total) by mouth daily before breakfast. 30 capsule 5  . loratadine (CLARITIN) 10 MG tablet Take 1 tablet (10 mg total) by mouth daily. 30 tablet 5  . magnesium oxide (MAG-OX) 400 (241.3 Mg) MG tablet Take 1 tablet (400 mg total) by mouth daily. (Patient taking differently: Take 400 mg by mouth 2 (two) times daily. ) 30 tablet 0  . Multiple Vitamin (MULTIVITAMIN WITH MINERALS) TABS tablet Take 1 tablet by mouth daily.    . potassium chloride (KLOR-CON) 10  MEQ tablet TAKE 2 TABLETS TWICE A DAY 120 tablet 2  . QUEtiapine (SEROQUEL) 25 MG tablet TAKE 3 TABLETS AT BEDTIME AS DIRECTED 90 tablet 0  . simvastatin (ZOCOR) 80 MG tablet TAKE 1 TABLET BY MOUTH DAILY.. 90 tablet 0  . venlafaxine (EFFEXOR) 37.5 MG tablet Take 1 tablet (37.5 mg total) by mouth 2 (two) times daily. 60 tablet 11  . azithromycin (ZITHROMAX Z-PAK) 250 MG tablet Take 2 tablets day 1 then 1 tablet a day for the next 4 days 6 each 0   Current Facility-Administered Medications  Medication Dose Route Frequency Provider Last Rate Last Admin  . cyanocobalamin ((VITAMIN B-12)) injection 1,000 mcg  1,000 mcg Intramuscular Q30 days Chipper Herb, MD   1,000 mcg at 10/04/19 1048    Allergies  Allergen Reactions  . Actonel [Risedronate] Other (See Comments)    Bone pain and nausea   . Lipitor [Atorvastatin Calcium] Other (See Comments)    myalgias  . Lyrica [Pregabalin] Other (See Comments)    SORES IN MOUTH   . Sibutramine Hcl Monohydrate Other (See Comments)    UNSPECIFIED REACTION   . Alendronate Sodium Nausea Only  . Latex Rash and Other (See Comments)    RA to latex 1982; includes bandaids    . Levofloxacin Nausea And Vomiting  . Meloxicam Other (See Comments)    Vaginal itching     Review of Systems  She has gained 5 pounds in weight since last year No recent hospitalizations Airway secretions heavy now during the high pollen season.  She uses suctioning at home and saline irrigation of her sinuses She is currently coughing up green purulent material from probable sinusitis and was given a short course of azithromycin. No recent hospitalizations. Anxiety adequately managed with meds and counseling. BP 123/76 (BP Location: Left Arm, Patient Position: Sitting, Cuff Size: Normal)   Pulse 84   Temp 97.9 F (36.6 C)   Resp 20   Ht 5\' 5"  (1.651 m)   Wt 182 lb 6.4 oz (82.7 kg)  SpO2 97% Comment: RA  BMI 30.35 kg/m  Physical Exam      Exam    General- alert and  comfortable    Neck- no JVD, no cervical adenopathy palpable, no carotid bruit.  Tracheostomy site clean and stable   Lungs- clear without rales, wheezes   Cor- regular rate and rhythm, 2/6 systolic murmur of TR, no gallop   Abdomen- soft, non-tender   Extremities - warm, non-tender, minimal edema   Neuro- oriented, appropriate, no focal weakness   Diagnostic Tests: Echocardiogram performed April 12 images personally reviewed and discussed with patient. Good biventricular function normal functioning bioprosthetic aortic valve with mild tricuspid insufficiency  Impression: Patient doing well 5 years postop repair of aortic root and ascending aortic aneurysm.  Blood pressure well controlled.  Echo looks fine.  No symptoms of angina or heart failure.  Plan: Plan on seeing the patient back in 8 months for final routine follow-up.  She will be ready to transition care to her cardiology and primary care physician.   Len Childs, MD [facility] [phone]

## 2019-10-16 ENCOUNTER — Ambulatory Visit: Payer: Medicare Other | Admitting: Cardiothoracic Surgery

## 2019-10-18 ENCOUNTER — Ambulatory Visit: Payer: Self-pay | Admitting: Licensed Clinical Social Worker

## 2019-10-18 DIAGNOSIS — K219 Gastro-esophageal reflux disease without esophagitis: Secondary | ICD-10-CM

## 2019-10-18 DIAGNOSIS — E538 Deficiency of other specified B group vitamins: Secondary | ICD-10-CM

## 2019-10-18 DIAGNOSIS — F411 Generalized anxiety disorder: Secondary | ICD-10-CM

## 2019-10-18 NOTE — Patient Instructions (Addendum)
Licensed Clinical Social Worker Visit Information  Goals we discussed today:  Goals    . Client will talk  with [name] in next 30 days about managing anxiety issues for client (pt-stated)     Current Barriers:  . Client fatigues easily . Client has tracheostomy and has Chronic Diagnoses of CHF,GAD, Vitamin B12 deficiency, osteoarthritis and GERD   Clinical Social Work Clinical Goal(s):  . Client and [name] to communicate in next 30 days to discuss client management of anxiety issues of client  Interventions: . [name] talked with [name] about nursing support with RNCM . [name] talked with [name] about [name] support . [name] talked with Emalin about her in home care needs of client  . Talked with [name] about ambulation needs (she uses a cane to help her  walk) . Talked with client about meal provision for client . Talked with client about relaxation techniques of choice (she likes to watch TV, or to sit on porch, talk on the phone with family members, likes to go out to eat)  Talked with client about current client needs  Talked with client about pain issues of client  Talked with Constantina about her social support network (spouse, daughter)  Patient Self Care Activities:  . Takes medications as prescribed . Attends scheduled medical appointments  Plan:   Client to attend scheduled medical appointments [name] to call client in next 4 weeks to talk with her about anxiety symptoms management of client Client to call RNCM as needed to discuss nursing needs of client Client to use relaxation techniques of choice to manage anxiety or stress symptoms  Initial goal documentation       Materials Provided: No  Follow Up Plan: [name] to call client in next 4 weeks to talk with her about anxiety symptoms  management of client  The patient verbalized understanding of instructions provided today and declined a print copy of patient instruction materials.   [name].Jodene Polyak MSW, [name] Licensed Clinical  Social Worker [facility] [phone]

## 2019-10-18 NOTE — Chronic Care Management (AMB) (Signed)
Chronic Care Management    Clinical Social Work Follow Up Note  10/18/2019 Name: [name] MRN: [id number] DOB: Jan 12, 1941  [name] is a 79 y.o. year old female who is a primary care patient of [name], [name], MD. The CCM team was consulted for assistance with [name] .   Review of patient status, including review of consultants reports, other relevant assessments, and collaboration with appropriate care team members and the patient's provider was performed as part of comprehensive patient evaluation and provision of chronic care management services.    SDOH (Social Determinants of Health) assessments performed: Yes;risk for depression; risk for tobacco use; risk of physical inactivity    Chronic Care Management from 04/12/2019 in [place]  PHQ-9 Total Score  5     GAD 7 : Generalized Anxiety Score 04/12/2019  Nervous, Anxious, on Edge 1  Control/stop worrying 1  Worry too much - different things 1  Trouble relaxing 1  Restless 0  Easily annoyed or irritable 0  Afraid - awful might happen 0  Total GAD 7 Score 4  Anxiety Difficulty Somewhat difficult    Outpatient Encounter Medications as of 10/18/2019  Medication Sig  . aspirin 81 MG chewable tablet Chew 81 mg by mouth at bedtime.   Marland Kitchen azithromycin (ZITHROMAX Z-PAK) 250 MG tablet Take 2 tablets day 1 then 1 tablet a day for the next 4 days  . calcium carbonate (OS-CAL - DOSED IN MG OF ELEMENTAL CALCIUM) 1250 (500 Ca) MG tablet Take 1 tablet (500 mg of elemental calcium total) by mouth daily with breakfast.  . clonazePAM (KLONOPIN) 0.25 MG disintegrating tablet DISSOLVE 1 TABLET BY MOUTH 3 TIMES A DAY  . furosemide (LASIX) 20 MG tablet Take 10 mg by mouth daily. 10 mg po  Mon/Wed/Fridays  . hydrocortisone (ANUSOL-HC) 2.5 % rectal cream APPLY RECTALLY 2 TIMES A DAY AS NEEDED  . ketoconazole (NIZORAL) 2 % cream Apply topically daily.  Marland Kitchen latanoprost (XALATAN) 0.005 % ophthalmic  solution Place 1 drop into both eyes at bedtime.  Marland Kitchen levothyroxine (SYNTHROID) 88 MCG tablet Take 88 mcg by mouth daily before breakfast.  . linaclotide (LINZESS) 72 MCG capsule Take 1 capsule (72 mcg total) by mouth daily before breakfast.  . loratadine (CLARITIN) 10 MG tablet Take 1 tablet (10 mg total) by mouth daily.  . magnesium oxide (MAG-OX) 400 (241.3 Mg) MG tablet Take 1 tablet (400 mg total) by mouth daily. (Patient taking differently: Take 400 mg by mouth 2 (two) times daily. )  . Multiple Vitamin (MULTIVITAMIN WITH MINERALS) TABS tablet Take 1 tablet by mouth daily.  . potassium chloride (KLOR-CON) 10 MEQ tablet TAKE 2 TABLETS TWICE A DAY  . QUEtiapine (SEROQUEL) 25 MG tablet TAKE 3 TABLETS AT BEDTIME AS DIRECTED  . simvastatin (ZOCOR) 80 MG tablet TAKE 1 TABLET BY MOUTH DAILY..  . venlafaxine (EFFEXOR) 37.5 MG tablet Take 1 tablet (37.5 mg total) by mouth 2 (two) times daily.   Facility-Administered Encounter Medications as of 10/18/2019  Medication  . cyanocobalamin ((VITAMIN B-12)) injection 1,000 mcg    Goals    . Client will talk  with [name] in next 30 days about managing anxiety issues for client (pt-stated)     Current Barriers:  . Client fatigues easily . Client has tracheostomy and has Chronic Diagnoses of CHF,GAD, Vitamin B12 deficiency, osteoarthritis and GERD   Clinical Social Work Clinical Goal(s):  . Client and [name] to communicate in next 30 days to discuss  client management of anxiety issues of client  Interventions: . [name] talked with [name] about nursing support with RNCM . [name] talked with [name] about [name] support . [name] talked with Nayah about her in home care needs of client  . Talked with [name] about ambulation needs (she uses a cane to help her  walk) . Talked with client about meal provision for client . Talked with client about relaxation techniques of choice (she likes to watch TV, or to sit on porch, talk on the phone with family members, likes to go  out to eat) . Talked with client about current client needs . Talked with client about pain issues of client . Talked with Dafney about her social support network (spouse, daughter)   Patient Self Care Activities:  . Takes medications as prescribed . Attends scheduled medical appointments  Plan:   Client to attend scheduled medical appointments [name] to call client in next 4 weeks to talk with her about anxiety symptoms management of client Client to call RNCM as needed to discuss nursing needs of client Client to use relaxation techniques of choice to manage anxiety or stress symptoms  Initial goal documentation     Follow Up Plan: [name] to call client in next 4 weeks to talk with client about anxiety symptoms management of client  [name].Ej Pinson MSW, [name] Licensed Clinical Social Worker [facility] Care Management [phone]

## 2019-10-31 ENCOUNTER — Other Ambulatory Visit: Payer: Self-pay | Admitting: Family Medicine

## 2019-11-05 ENCOUNTER — Other Ambulatory Visit: Payer: Self-pay | Admitting: Family Medicine

## 2019-11-07 ENCOUNTER — Other Ambulatory Visit: Payer: Self-pay | Admitting: Family Medicine

## 2019-11-12 ENCOUNTER — Other Ambulatory Visit: Payer: Self-pay | Admitting: Family Medicine

## 2019-11-19 ENCOUNTER — Other Ambulatory Visit: Payer: Self-pay | Admitting: *Deleted

## 2019-11-19 DIAGNOSIS — R059 Cough, unspecified: Secondary | ICD-10-CM

## 2019-11-21 ENCOUNTER — Ambulatory Visit: Payer: Self-pay | Admitting: Licensed Clinical Social Worker

## 2019-11-21 DIAGNOSIS — F411 Generalized anxiety disorder: Secondary | ICD-10-CM

## 2019-11-21 DIAGNOSIS — E538 Deficiency of other specified B group vitamins: Secondary | ICD-10-CM

## 2019-11-21 DIAGNOSIS — K219 Gastro-esophageal reflux disease without esophagitis: Secondary | ICD-10-CM

## 2019-11-21 NOTE — Chronic Care Management (AMB) (Signed)
Chronic Care Management    Clinical Social Work Follow Up Note  11/21/2019 Name: [name] MRN: [id number] DOB: Nov 30, 1940  [name] is a 79 y.o. year old female who is a primary care patient of [name], [name], MD. The CCM team was consulted for assistance with [name] .   Review of patient status, including review of consultants reports, other relevant assessments, and collaboration with appropriate care team members and the patient's provider was performed as part of comprehensive patient evaluation and provision of chronic care management services.    SDOH (Social Determinants of Health) assessments performed: No;risk for tobacco use; risk for depression; risk for physical inactivity    Chronic Care Management from 04/12/2019 in [place]  PHQ-9 Total Score  5     GAD 7 : Generalized Anxiety Score 04/12/2019  Nervous, Anxious, on Edge 1  Control/stop worrying 1  Worry too much - different things 1  Trouble relaxing 1  Restless 0  Easily annoyed or irritable 0  Afraid - awful might happen 0  Total GAD 7 Score 4  Anxiety Difficulty Somewhat difficult    Outpatient Encounter Medications as of 11/21/2019  Medication Sig  . aspirin 81 MG chewable tablet Chew 81 mg by mouth at bedtime.   Marland Kitchen azithromycin (ZITHROMAX Z-PAK) 250 MG tablet Take 2 tablets day 1 then 1 tablet a day for the next 4 days  . calcium carbonate (OS-CAL - DOSED IN MG OF ELEMENTAL CALCIUM) 1250 (500 Ca) MG tablet Take 1 tablet (500 mg of elemental calcium total) by mouth daily with breakfast.  . clonazePAM (KLONOPIN) 0.25 MG disintegrating tablet DISSOLVE 1 TABLET BY MOUTH 3 TIMES A DAY  . furosemide (LASIX) 20 MG tablet Take 10 mg by mouth daily. 10 mg po  Mon/Wed/Fridays  . hydrocortisone (ANUSOL-HC) 2.5 % rectal cream APPLY RECTALLY 2 TIMES A DAY AS NEEDED  . ketoconazole (NIZORAL) 2 % cream Apply topically daily.  Marland Kitchen latanoprost (XALATAN) 0.005 % ophthalmic  solution Place 1 drop into both eyes at bedtime.  Marland Kitchen levothyroxine (SYNTHROID) 88 MCG tablet Take 88 mcg by mouth daily before breakfast.  . linaclotide (LINZESS) 72 MCG capsule Take 1 capsule (72 mcg total) by mouth daily before breakfast.  . loratadine (CLARITIN) 10 MG tablet Take 1 tablet (10 mg total) by mouth daily.  . magnesium oxide (MAG-OX) 400 (241.3 Mg) MG tablet Take 1 tablet (400 mg total) by mouth daily. (Patient taking differently: Take 400 mg by mouth 2 (two) times daily. )  . Multiple Vitamin (MULTIVITAMIN WITH MINERALS) TABS tablet Take 1 tablet by mouth daily.  . potassium chloride (KLOR-CON) 10 MEQ tablet TAKE 2 TABLETS TWICE A DAY  . QUEtiapine (SEROQUEL) 25 MG tablet TAKE 3 TABLETS AT BEDTIME AS DIRECTED  . simvastatin (ZOCOR) 80 MG tablet TAKE 1 TABLET BY MOUTH DAILY..  . venlafaxine (EFFEXOR) 37.5 MG tablet Take 1 tablet (37.5 mg total) by mouth 2 (two) times daily.   Facility-Administered Encounter Medications as of 11/21/2019  Medication  . cyanocobalamin ((VITAMIN B-12)) injection 1,000 mcg     [name] called client home phone number several times today and was not able to speak via phone with client. However, [name] did leave phone message for client today requesting that client please return call to [name] at [phone].   Follow Up Plan: [name] to call client in next 4 weeks to talk with client about anxiety symptoms management for client  [name].Conrad Zajkowski MSW, [name] Licensed Clinical  Social Worker [facility] Care Management [phone]

## 2019-11-21 NOTE — Patient Instructions (Addendum)
Licensed Clinical Social Worker Visit Information  Materials Provided: No  11/21/2019   Name: [name] MRN: [id number] DOB: 02-26-1941   [name] is a 79 y.o. year old female who is a primary care patient of [name], [name], MD. The CCM team was consulted for assistance with [name] .   Review of patient status, including review of consultants reports, other relevant assessments, and collaboration with appropriate care team members and the patient's provider was performed as part of comprehensive patient evaluation and provision of chronic care management services.   SDOH (Social Determinants of Health) assessments performed: No;risk for tobacco use; risk for depression; risk for physical inactivity  [name] called client home phone number several times today and was not able to speak via phone with client. However, [name] did leave phone message for client today requesting that client please return call to [name] at [phone].   Follow Up Plan: [name] to call client in next 4 weeks to talk with client about anxiety symptoms management for client  [name] was not able to speak via phone with client today; thus, the patient was not abel to verbalize understanding of instructions provided today and was not able to accept or decline a print copy of patient instruction materials.   [name].Forrest MSW, [name] Licensed Clinical Social Worker [facility] Care Management [phone]

## 2019-11-22 ENCOUNTER — Telehealth: Payer: Self-pay | Admitting: Family Medicine

## 2019-11-22 ENCOUNTER — Ambulatory Visit: Payer: Self-pay | Admitting: Licensed Clinical Social Worker

## 2019-11-22 DIAGNOSIS — E538 Deficiency of other specified B group vitamins: Secondary | ICD-10-CM

## 2019-11-22 DIAGNOSIS — K219 Gastro-esophageal reflux disease without esophagitis: Secondary | ICD-10-CM

## 2019-11-22 DIAGNOSIS — F411 Generalized anxiety disorder: Secondary | ICD-10-CM

## 2019-11-22 MED ORDER — BENZONATATE 200 MG PO CAPS
200.0000 mg | ORAL_CAPSULE | Freq: Three times a day (TID) | ORAL | 1 refills | Status: DC | PRN
Start: 2019-11-22 — End: 2021-07-06

## 2019-11-22 NOTE — Patient Instructions (Addendum)
Licensed Clinical Social Worker Visit Information  Goals we discussed today:  Goals    . Client will talk  with [name] in next 30 days about managing anxiety issues for client (pt-stated)     Current Barriers:  . Client fatigues easily . Client has tracheostomy and has Chronic Diagnoses of CHF,GAD, Vitamin B12 deficiency, osteoarthritis and GERD   Clinical Social Work Clinical Goal(s):  . Client and [name] to communicate in next 30 days to discuss client management of anxiety issues of client  Interventions:  [name] talked with [name] about nursing support with RNCM  [name] talked with [name] about [name] support  [name] talked with [name] about her in home care needs of client   Talked with [name] about ambulation needs (she uses a cane to help her  walk)  Talked with client about meal provision for client  Talked previously with client about relaxation techniques of choice (she likes to watch TV, or to sit on porch, talk on the phone with family members, likes to go out to eat)    Encouraged [name] to call [facility] and talk with LPN about current coughing issues of client (she said she is coughing regularly and she does have a tracheostomy)  Patient Self Care Activities:  . Takes medications as prescribed . Attends scheduled medical appointments  Plan:   Client to attend scheduled medical appointments [name] to call client in next 4 weeks to talk with her about anxiety symptoms management of client Client to call RNCM as needed to discuss nursing needs of client Client to use relaxation techniques of choice to manage anxiety or stress symptoms  Initial goal documentation     Materials Provided: No  Follow Up Plan: [name] to call client in next 4 weeks to talk with her about anxiety symptoms management for client  The patient verbalized understanding of instructions provided today and declined a print copy of patient instruction materials.   [name].[name] MSW, [name] Licensed Clinical  Social Worker [facility] Care [facility] [phone]

## 2019-11-22 NOTE — Telephone Encounter (Signed)
What can she take that will  not interfere with regular medicines?

## 2019-11-22 NOTE — Telephone Encounter (Signed)
Patient aware, script is ready. 

## 2019-11-22 NOTE — Telephone Encounter (Signed)
Tessalon Prescription sent to pharmacy, if cough worsens will need an appt.

## 2019-11-22 NOTE — Telephone Encounter (Signed)
Pt called stating that she has a bad cough and wants advice on what she can take OTC to help with the cough.

## 2019-11-22 NOTE — Chronic Care Management (AMB) (Signed)
Chronic Care Management    Clinical Social Work Follow Up Note  11/22/2019 Name: [name] MRN: [id number] DOB: 1940-12-17  [name] is a 79 y.o. year old female who is a primary care patient of [name], [name], MD. The CCM team was consulted for assistance with [name] .   Review of patient status, including review of consultants reports, other relevant assessments, and collaboration with appropriate care team members and the patient's provider was performed as part of comprehensive patient evaluation and provision of chronic care management services.    SDOH (Social Determinants of Health) assessments performed: Yes;risk for tobacco use; risk for depression risk for physical inactivity    Chronic Care Management from 04/12/2019 in [place]  PHQ-9 Total Score  5     GAD 7 : Generalized Anxiety Score 04/12/2019  Nervous, Anxious, on Edge 1  Control/stop worrying 1  Worry too much - different things 1  Trouble relaxing 1  Restless 0  Easily annoyed or irritable 0  Afraid - awful might happen 0  Total GAD 7 Score 4  Anxiety Difficulty Somewhat difficult    Outpatient Encounter Medications as of 11/22/2019  Medication Sig  . aspirin 81 MG chewable tablet Chew 81 mg by mouth at bedtime.   Marland Kitchen azithromycin (ZITHROMAX Z-PAK) 250 MG tablet Take 2 tablets day 1 then 1 tablet a day for the next 4 days  . calcium carbonate (OS-CAL - DOSED IN MG OF ELEMENTAL CALCIUM) 1250 (500 Ca) MG tablet Take 1 tablet (500 mg of elemental calcium total) by mouth daily with breakfast.  . clonazePAM (KLONOPIN) 0.25 MG disintegrating tablet DISSOLVE 1 TABLET BY MOUTH 3 TIMES A DAY  . furosemide (LASIX) 20 MG tablet Take 10 mg by mouth daily. 10 mg po  Mon/Wed/Fridays  . hydrocortisone (ANUSOL-HC) 2.5 % rectal cream APPLY RECTALLY 2 TIMES A DAY AS NEEDED  . ketoconazole (NIZORAL) 2 % cream Apply topically daily.  Marland Kitchen latanoprost (XALATAN) 0.005 % ophthalmic  solution Place 1 drop into both eyes at bedtime.  Marland Kitchen levothyroxine (SYNTHROID) 88 MCG tablet Take 88 mcg by mouth daily before breakfast.  . linaclotide (LINZESS) 72 MCG capsule Take 1 capsule (72 mcg total) by mouth daily before breakfast.  . loratadine (CLARITIN) 10 MG tablet Take 1 tablet (10 mg total) by mouth daily.  . magnesium oxide (MAG-OX) 400 (241.3 Mg) MG tablet Take 1 tablet (400 mg total) by mouth daily. (Patient taking differently: Take 400 mg by mouth 2 (two) times daily. )  . Multiple Vitamin (MULTIVITAMIN WITH MINERALS) TABS tablet Take 1 tablet by mouth daily.  . potassium chloride (KLOR-CON) 10 MEQ tablet TAKE 2 TABLETS TWICE A DAY  . QUEtiapine (SEROQUEL) 25 MG tablet TAKE 3 TABLETS AT BEDTIME AS DIRECTED  . simvastatin (ZOCOR) 80 MG tablet TAKE 1 TABLET BY MOUTH DAILY..  . venlafaxine (EFFEXOR) 37.5 MG tablet Take 1 tablet (37.5 mg total) by mouth 2 (two) times daily.   Facility-Administered Encounter Medications as of 11/22/2019  Medication  . cyanocobalamin ((VITAMIN B-12)) injection 1,000 mcg    Goals    . Client will talk  with [name] in next 30 days about managing anxiety issues for client (pt-stated)     Current Barriers:  . Client fatigues easily . Client has tracheostomy and has Chronic Diagnoses of CHF,GAD, Vitamin B12 deficiency, osteoarthritis and GERD   Clinical Social Work Clinical Goal(s):  . Client and [name] to communicate in next 30 days to discuss  client management of anxiety issues of client  Interventions: . [name] talked with [name] about nursing support with RNCM . [name] talked with [name] about [name] support . [name] talked with Sacha about her in home care needs of client  . Talked with [name] about ambulation needs (she uses a cane to help her  walk) . Talked with client about meal provision for client  Talked previously with client about relaxation techniques of choice (she likes to watch TV, or to sit on porch, talk on the phone with family members,  likes to go out to eat)    Encouraged Alicyn to call [facility] and talk with LPN about current coughing issues of client (she said she is coughing regularly and she does have a tracheostomy)  Patient Self Care Activities:  . Takes medications as prescribed . Attends scheduled medical appointments  Plan:   Client to attend scheduled medical appointments [name] to call client in next 4 weeks to talk with her about anxiety symptoms management of client Client to call RNCM as needed to discuss nursing needs of client Client to use relaxation techniques of choice to manage anxiety or stress symptoms  Initial goal documentation       Follow Up Plan: [name] to call client in next 4 weeks to talk with client about anxiety symptoms management for client  [name] MSW, [name] Licensed Clinical Social Worker [facility] Care [facility] [phone]

## 2019-12-02 ENCOUNTER — Other Ambulatory Visit: Payer: Self-pay | Admitting: Family Medicine

## 2019-12-10 ENCOUNTER — Other Ambulatory Visit: Payer: Self-pay

## 2019-12-10 ENCOUNTER — Ambulatory Visit (INDEPENDENT_AMBULATORY_CARE_PROVIDER_SITE_OTHER): Payer: Medicare Other | Admitting: *Deleted

## 2019-12-10 DIAGNOSIS — E538 Deficiency of other specified B group vitamins: Secondary | ICD-10-CM

## 2019-12-10 MED ORDER — CYANOCOBALAMIN 1000 MCG/ML IJ SOLN
1000.0000 ug | INTRAMUSCULAR | Status: AC
  Administered 2019-12-10 – 2020-11-27 (×10): 1000 ug via INTRAMUSCULAR

## 2019-12-10 NOTE — Progress Notes (Signed)
Pt given B12 injection IM left deltoid and tolerated well. °

## 2019-12-10 NOTE — Patient Instructions (Signed)

## 2019-12-13 ENCOUNTER — Encounter: Payer: Self-pay | Admitting: Family Medicine

## 2019-12-13 ENCOUNTER — Ambulatory Visit (INDEPENDENT_AMBULATORY_CARE_PROVIDER_SITE_OTHER): Payer: Medicare Other | Admitting: Family Medicine

## 2019-12-13 ENCOUNTER — Other Ambulatory Visit: Payer: Self-pay

## 2019-12-13 VITALS — BP 115/73 | HR 79 | Temp 97.9°F | Ht 65.0 in | Wt 188.0 lb

## 2019-12-13 DIAGNOSIS — E7800 Pure hypercholesterolemia, unspecified: Secondary | ICD-10-CM | POA: Diagnosis not present

## 2019-12-13 DIAGNOSIS — K219 Gastro-esophageal reflux disease without esophagitis: Secondary | ICD-10-CM

## 2019-12-13 DIAGNOSIS — I10 Essential (primary) hypertension: Secondary | ICD-10-CM | POA: Diagnosis not present

## 2019-12-13 DIAGNOSIS — Z930 Tracheostomy status: Secondary | ICD-10-CM

## 2019-12-13 MED ORDER — CLONAZEPAM 0.25 MG PO TBDP: ORAL_TABLET | ORAL | 1 refills | Status: DC

## 2019-12-13 NOTE — Progress Notes (Signed)
BP 115/73    Pulse 79    Temp 97.9 F (36.6 C)    Ht 5' 5"  (1.651 m)    Wt 188 lb (85.3 kg)    SpO2 100%    BMI 31.28 kg/m    Subjective:   Patient ID: [name], female    DOB: Mar 03, 1941, 79 y.o.   MRN: [id number]  HPI: [name] is a 79 y.o. female presenting on 12/13/2019 for Medical Management of Chronic Issues and Hypertension   HPI Hypertension Patient is currently on no medication currently for hypertension, and their blood pressure today is 115/73. Patient denies any lightheadedness or dizziness. Patient denies headaches, blurred vision, chest pains, shortness of breath, or weakness. Denies any side effects from medication and is content with current medication.   Hyperlipidemia Patient is coming in for recheck of his hyperlipidemia. The patient is currently taking simvastatin. They deny any issues with myalgias or history of liver damage from it. They deny any focal numbness or weakness or chest pain. Marland Kitchen  GERD Patient is currently on no medication currently, doing well.  She denies any major symptoms or abdominal pain or belching or burping. She denies any blood in her stool or lightheadedness or dizziness.   Patient is coming in today for recheck of things, she has found that wearing her throat be consistent with her check doctor and things are going well and she keeps it clean and changes frequently.  Relevant past medical, surgical, family and social history reviewed and updated as indicated. Interim medical history since our last visit reviewed. Allergies and medications reviewed and updated.  Review of Systems  Constitutional: Negative for chills and fever.  Eyes: Negative for redness and visual disturbance.  Respiratory: Negative for chest tightness and shortness of breath.   Cardiovascular: Negative for chest pain and leg swelling.  Musculoskeletal: Negative for back pain and gait problem.  Skin: Negative for rash.  Neurological: Negative for  light-headedness and headaches.  Psychiatric/Behavioral: Negative for agitation, behavioral problems, dysphoric mood, self-injury and sleep disturbance. The patient is not nervous/anxious.   All other systems reviewed and are negative.   Per HPI unless specifically indicated above   Allergies as of 12/13/2019      Reactions   Actonel [risedronate] Other (See Comments)   Bone pain and nausea   Lipitor [atorvastatin Calcium] Other (See Comments)   myalgias   Lyrica [pregabalin] Other (See Comments)   SORES IN MOUTH    Sibutramine Hcl Monohydrate Other (See Comments)   UNSPECIFIED REACTION    Alendronate Sodium Nausea Only   Latex Rash, Other (See Comments)   RA to latex 1982; includes bandaids    Levofloxacin Nausea And Vomiting   Meloxicam Other (See Comments)   Vaginal itching       Medication List       Accurate as of December 13, 2019 11:28 AM. If you have any questions, ask your nurse or doctor.        STOP taking these medications   azithromycin 250 MG tablet Commonly known as: Zithromax Z-Pak Stopped by: [name] Patrisia Faeth, MD     TAKE these medications   aspirin 81 MG chewable tablet Chew 81 mg by mouth at bedtime.   benzonatate 200 MG capsule Commonly known as: TESSALON Take 1 capsule (200 mg total) by mouth 3 (three) times daily as needed.   calcium carbonate 1250 (500 Ca) MG tablet Commonly known as: OS-CAL - dosed in mg of elemental calcium  Take 1 tablet (500 mg of elemental calcium total) by mouth daily with breakfast.   clonazePAM 0.25 MG disintegrating tablet Commonly known as: KLONOPIN DISSOLVE 1 TABLET BY MOUTH 3 TIMES A DAY   furosemide 20 MG tablet Commonly known as: LASIX Take 10 mg by mouth daily. 10 mg po  Mon/Wed/Fridays   hydrocortisone 2.5 % rectal cream Commonly known as: ANUSOL-HC APPLY RECTALLY 2 TIMES A DAY AS NEEDED   ketoconazole 2 % cream Commonly known as: NIZORAL Apply topically daily.   latanoprost 0.005 % ophthalmic  solution Commonly known as: XALATAN Place 1 drop into both eyes at bedtime.   levothyroxine 88 MCG tablet Commonly known as: SYNTHROID Take 88 mcg by mouth daily before breakfast.   linaclotide 72 MCG capsule Commonly known as: Linzess Take 1 capsule (72 mcg total) by mouth daily before breakfast.   loratadine 10 MG tablet Commonly known as: CLARITIN Take 1 tablet (10 mg total) by mouth daily.   magnesium oxide 400 (241.3 Mg) MG tablet Commonly known as: MAG-OX Take 1 tablet (400 mg total) by mouth daily. What changed: when to take this   multivitamin with minerals Tabs tablet Take 1 tablet by mouth daily.   potassium chloride 10 MEQ tablet Commonly known as: KLOR-CON TAKE 2 TABLETS TWICE A DAY   QUEtiapine 25 MG tablet Commonly known as: SEROQUEL TAKE 3 TABLETS AT BEDTIME AS DIRECTED   simvastatin 80 MG tablet Commonly known as: ZOCOR TAKE 1 TABLET BY MOUTH DAILY.Marland Kitchen   venlafaxine 37.5 MG tablet Commonly known as: EFFEXOR Take 1 tablet (37.5 mg total) by mouth 2 (two) times daily.        Objective:   BP 115/73    Pulse 79    Temp 97.9 F (36.6 C)    Ht 5' 5"  (1.651 m)    Wt 188 lb (85.3 kg)    SpO2 100%    BMI 31.28 kg/m   Wt Readings from Last 3 Encounters:  12/13/19 188 lb (85.3 kg)  10/15/19 182 lb 6.4 oz (82.7 kg)  08/14/19 176 lb 6.4 oz (80 kg)    Physical Exam Vitals and nursing note reviewed.  Constitutional:      General: She is not in acute distress.    Appearance: She is well-developed. She is not diaphoretic.  Eyes:     Conjunctiva/sclera: Conjunctivae normal.  Cardiovascular:     Rate and Rhythm: Normal rate and regular rhythm.     Heart sounds: Normal heart sounds. No murmur heard.   Pulmonary:     Effort: Pulmonary effort is normal. No respiratory distress.     Breath sounds: Normal breath sounds. No wheezing.  Musculoskeletal:        General: No tenderness. Normal range of motion.  Skin:    General: Skin is warm and dry.      Findings: No rash.  Neurological:     Mental Status: She is alert and oriented to person, place, and time.     Coordination: Coordination normal.  Psychiatric:        Behavior: Behavior normal.       Assessment & Plan:   Problem List Items Addressed This Visit      Cardiovascular and Mediastinum   Essential hypertension   Relevant Orders   CMP14+EGFR     Digestive   GERD     Other   Tracheostomy dependence ([place])   Pure hypercholesterolemia - Primary   Relevant Orders   CMP14+EGFR  Continue current medication, no changes.  Seems like she is doing well, continue trach management with [facility]. Follow up plan: Return in about 4 months (around 04/13/2020), or if symptoms worsen or fail to improve, for Hypertension and anxiety.  Counseling provided for all of the vaccine components No orders of the defined types were placed in this encounter.   Caryl Pina, MD [facility] 12/13/2019, 11:28 AM

## 2019-12-14 LAB — CMP14+EGFR
ALT: 10 IU/L (ref 0–32)
AST: 25 IU/L (ref 0–40)
Albumin/Globulin Ratio: 2.1 (ref 1.2–2.2)
Albumin: 4.7 g/dL (ref 3.7–4.7)
Alkaline Phosphatase: 118 IU/L (ref 48–121)
BUN/Creatinine Ratio: 15 (ref 12–28)
BUN: 17 mg/dL (ref 8–27)
Bilirubin Total: 0.3 mg/dL (ref 0.0–1.2)
CO2: 27 mmol/L (ref 20–29)
Calcium: 8.6 mg/dL — ABNORMAL LOW (ref 8.7–10.3)
Chloride: 105 mmol/L (ref 96–106)
Creatinine, Ser: 1.14 mg/dL — ABNORMAL HIGH (ref 0.57–1.00)
GFR calc Af Amer: 54 mL/min/{1.73_m2} — ABNORMAL LOW (ref >59)
GFR calc non Af Amer: 46 mL/min/{1.73_m2} — ABNORMAL LOW (ref >59)
Globulin, Total: 2.2 g/dL (ref 1.5–4.5)
Glucose: 74 mg/dL (ref 65–99)
Potassium: 4.3 mmol/L (ref 3.5–5.2)
Sodium: 145 mmol/L — ABNORMAL HIGH (ref 134–144)
Total Protein: 6.9 g/dL (ref 6.0–8.5)

## 2019-12-18 DIAGNOSIS — R0602 Shortness of breath: Secondary | ICD-10-CM | POA: Diagnosis not present

## 2019-12-18 DIAGNOSIS — Z930 Tracheostomy status: Secondary | ICD-10-CM | POA: Diagnosis not present

## 2019-12-18 DIAGNOSIS — J386 Stenosis of larynx: Secondary | ICD-10-CM | POA: Diagnosis not present

## 2019-12-18 DIAGNOSIS — J3802 Paralysis of vocal cords and larynx, bilateral: Secondary | ICD-10-CM | POA: Diagnosis not present

## 2019-12-18 DIAGNOSIS — R490 Dysphonia: Secondary | ICD-10-CM | POA: Diagnosis not present

## 2019-12-18 DIAGNOSIS — L929 Granulomatous disorder of the skin and subcutaneous tissue, unspecified: Secondary | ICD-10-CM | POA: Diagnosis not present

## 2019-12-20 ENCOUNTER — Other Ambulatory Visit: Payer: Medicare Other

## 2019-12-20 ENCOUNTER — Other Ambulatory Visit: Payer: Self-pay

## 2019-12-20 DIAGNOSIS — R7989 Other specified abnormal findings of blood chemistry: Secondary | ICD-10-CM | POA: Diagnosis not present

## 2019-12-21 ENCOUNTER — Other Ambulatory Visit: Payer: Self-pay | Admitting: Family Medicine

## 2019-12-21 ENCOUNTER — Other Ambulatory Visit: Payer: Self-pay | Admitting: Family

## 2019-12-21 LAB — BMP8+EGFR
BUN/Creatinine Ratio: 15 (ref 12–28)
BUN: 14 mg/dL (ref 8–27)
CO2: 25 mmol/L (ref 20–29)
Calcium: 8.4 mg/dL — ABNORMAL LOW (ref 8.7–10.3)
Chloride: 105 mmol/L (ref 96–106)
Creatinine, Ser: 0.92 mg/dL (ref 0.57–1.00)
GFR calc Af Amer: 69 mL/min/{1.73_m2} (ref >59)
GFR calc non Af Amer: 60 mL/min/{1.73_m2} (ref >59)
Glucose: 126 mg/dL — ABNORMAL HIGH (ref 65–99)
Potassium: 4.3 mmol/L (ref 3.5–5.2)
Sodium: 144 mmol/L (ref 134–144)

## 2019-12-31 ENCOUNTER — Ambulatory Visit: Payer: Medicare Other | Admitting: Licensed Clinical Social Worker

## 2019-12-31 DIAGNOSIS — K219 Gastro-esophageal reflux disease without esophagitis: Secondary | ICD-10-CM

## 2019-12-31 DIAGNOSIS — F411 Generalized anxiety disorder: Secondary | ICD-10-CM

## 2019-12-31 DIAGNOSIS — Z930 Tracheostomy status: Secondary | ICD-10-CM

## 2019-12-31 DIAGNOSIS — E538 Deficiency of other specified B group vitamins: Secondary | ICD-10-CM

## 2019-12-31 NOTE — Patient Instructions (Addendum)
Licensed Clinical Social Worker Visit Information  Materials Provided: No  12/31/2019  Name: [name]            MRN: [id number]       DOB: Nov 11, 1940  [name] is a 79 y.o. year old female who is a primary care patient of [name], [name], MD. The CCM team was consulted for assistance with [name] .   Review of patient status, including review of consultants reports, other relevant assessments, and collaboration with appropriate care team members and the patient's provider was performed as part of comprehensive patient evaluation and provision of chronic care management services.    SDOH (Social Determinants of Health) assessments performed: No;risk for physical inactivity; risk for depression; risk for tobacco use  [name] called home phone number several times for client today; however, [name] was not able to speak via phone with client today; However, [name] did leave phone message for Imaan Padgett asking her to please return call to [name] at [phone]  Follow Up Plan:[name] to call client in next 4 weeks to talk with client about anxiety symptoms management for client  [name] was not able to speak via phone with client today; thus, the patient was not able to verbalize understanding of instructions provided today and was not able to accept or decline a print copy of patient instruction materials.   [name].Jeylin Woodmansee MSW, [name] Licensed Clinical Social Worker [facility] [phone]

## 2019-12-31 NOTE — Chronic Care Management (AMB) (Signed)
Chronic Care Management    Clinical Social Work Follow Up Note  12/31/2019 Name: [name] MRN: [id number] DOB: 23-Nov-1940  [name] is a 79 y.o. year old female who is a primary care patient of [name], [name], MD. The CCM team was consulted for assistance with [name] .   Review of patient status, including review of consultants reports, other relevant assessments, and collaboration with appropriate care team members and the patient's provider was performed as part of comprehensive patient evaluation and provision of chronic care management services.    SDOH (Social Determinants of Health) assessments performed: No;risk for physical inactivity; risk for depression; risk for tobacco use     Chronic Care Management from 04/12/2019 in [place]  PHQ-9 Total Score 5      GAD 7 : Generalized Anxiety Score 04/12/2019  Nervous, Anxious, on Edge 1  Control/stop worrying 1  Worry too much - different things 1  Trouble relaxing 1  Restless 0  Easily annoyed or irritable 0  Afraid - awful might happen 0  Total GAD 7 Score 4  Anxiety Difficulty Somewhat difficult    Outpatient Encounter Medications as of 12/31/2019  Medication Sig  . aspirin 81 MG chewable tablet Chew 81 mg by mouth at bedtime.   . benzonatate (TESSALON) 200 MG capsule Take 1 capsule (200 mg total) by mouth 3 (three) times daily as needed.  . calcium carbonate (OS-CAL - DOSED IN MG OF ELEMENTAL CALCIUM) 1250 (500 Ca) MG tablet Take 1 tablet (500 mg of elemental calcium total) by mouth daily with breakfast.  . [START ON 02/20/2020] clonazePAM (KLONOPIN) 0.25 MG disintegrating tablet DISSOLVE 1 TABLET BY MOUTH 3 TIMES A DAY  . furosemide (LASIX) 20 MG tablet Take 10 mg by mouth daily. 10 mg po  Mon/Wed/Fridays  . hydrocortisone (ANUSOL-HC) 2.5 % rectal cream APPLY RECTALLY 2 TIMES A DAY AS NEEDED  . ketoconazole (NIZORAL) 2 % cream Apply topically daily.  Marland Kitchen latanoprost  (XALATAN) 0.005 % ophthalmic solution Place 1 drop into both eyes at bedtime.  Marland Kitchen levothyroxine (SYNTHROID) 88 MCG tablet Take 88 mcg by mouth daily before breakfast.  . linaclotide (LINZESS) 72 MCG capsule Take 1 capsule (72 mcg total) by mouth daily before breakfast.  . loratadine (CLARITIN) 10 MG tablet Take 1 tablet (10 mg total) by mouth daily.  . magnesium oxide (MAG-OX) 400 (241.3 Mg) MG tablet Take 1 tablet (400 mg total) by mouth daily. (Patient taking differently: Take 400 mg by mouth 2 (two) times daily. )  . Multiple Vitamin (MULTIVITAMIN WITH MINERALS) TABS tablet Take 1 tablet by mouth daily.  . potassium chloride (KLOR-CON) 10 MEQ tablet TAKE 2 TABLETS TWICE A DAY  . QUEtiapine (SEROQUEL) 25 MG tablet TAKE 3 TABLETS AT BEDTIME AS DIRECTED  . simvastatin (ZOCOR) 80 MG tablet TAKE 1 TABLET BY MOUTH DAILY..  . venlafaxine (EFFEXOR) 37.5 MG tablet Take 1 tablet (37.5 mg total) by mouth 2 (two) times daily.   Facility-Administered Encounter Medications as of 12/31/2019  Medication  . cyanocobalamin ((VITAMIN B-12)) injection 1,000 mcg   [name] called home phone number several times for client today; however, [name] was not able to speak via phone with client today; However, [name] did leave phone message for [name] asking her to please return call to [name] at [phone]   Follow Up Plan: [name] to call client in next 4 weeks to talk with client about anxiety symptoms management for client  [name].Mystery Schrupp MSW,  [name] Licensed Clinical Social Worker [facility] [phone]

## 2020-01-01 ENCOUNTER — Other Ambulatory Visit: Payer: Self-pay | Admitting: Family Medicine

## 2020-01-01 ENCOUNTER — Other Ambulatory Visit: Payer: Self-pay | Admitting: *Deleted

## 2020-01-01 MED ORDER — VENLAFAXINE HCL 37.5 MG PO TABS: 37.5000 mg | ORAL_TABLET | Freq: Two times a day (BID) | ORAL | 2 refills | Status: DC

## 2020-01-10 ENCOUNTER — Ambulatory Visit (INDEPENDENT_AMBULATORY_CARE_PROVIDER_SITE_OTHER): Payer: Medicare Other

## 2020-01-10 ENCOUNTER — Other Ambulatory Visit: Payer: Self-pay

## 2020-01-10 DIAGNOSIS — E538 Deficiency of other specified B group vitamins: Secondary | ICD-10-CM | POA: Diagnosis not present

## 2020-01-10 NOTE — Progress Notes (Signed)
Cyanocobalamin injection given to right deltoid.  Patient tolerated well. 

## 2020-01-22 ENCOUNTER — Other Ambulatory Visit: Payer: Self-pay | Admitting: Family Medicine

## 2020-01-31 ENCOUNTER — Ambulatory Visit: Payer: Medicare Other | Admitting: Licensed Clinical Social Worker

## 2020-01-31 DIAGNOSIS — K219 Gastro-esophageal reflux disease without esophagitis: Secondary | ICD-10-CM

## 2020-01-31 DIAGNOSIS — E538 Deficiency of other specified B group vitamins: Secondary | ICD-10-CM

## 2020-01-31 DIAGNOSIS — F411 Generalized anxiety disorder: Secondary | ICD-10-CM

## 2020-01-31 NOTE — Chronic Care Management (AMB) (Signed)
Chronic Care Management    Clinical Social Work Follow Up Note  01/31/2020 Name: [name] MRN: [id number] DOB: 07/15/1940  [name] is a 79 y.o. year old female who is a primary care patient of [name], [name], MD. The CCM team was consulted for assistance with [name] .   Review of patient status, including review of consultants reports, other relevant assessments, and collaboration with appropriate care team members and the patient's provider was performed as part of comprehensive patient evaluation and provision of chronic care management services.    SDOH (Social Determinants of Health) assessments performed: No;risk for tobacco use; risk for depression; risk for physical inactivity    Chronic Care Management from 04/12/2019 in [place]  PHQ-9 Total Score 5     GAD 7 : Generalized Anxiety Score 04/12/2019  Nervous, Anxious, on Edge 1  Control/stop worrying 1  Worry too much - different things 1  Trouble relaxing 1  Restless 0  Easily annoyed or irritable 0  Afraid - awful might happen 0  Total GAD 7 Score 4  Anxiety Difficulty Somewhat difficult    Outpatient Encounter Medications as of 01/31/2020  Medication Sig  . aspirin 81 MG chewable tablet Chew 81 mg by mouth at bedtime.   . benzonatate (TESSALON) 200 MG capsule Take 1 capsule (200 mg total) by mouth 3 (three) times daily as needed.  . calcium carbonate (OS-CAL - DOSED IN MG OF ELEMENTAL CALCIUM) 1250 (500 Ca) MG tablet Take 1 tablet (500 mg of elemental calcium total) by mouth daily with breakfast.  . [START ON 02/20/2020] clonazePAM (KLONOPIN) 0.25 MG disintegrating tablet DISSOLVE 1 TABLET BY MOUTH 3 TIMES A DAY  . furosemide (LASIX) 20 MG tablet Take 10 mg by mouth daily. 10 mg po  Mon/Wed/Fridays  . hydrocortisone (ANUSOL-HC) 2.5 % rectal cream APPLY RECTALLY 2 TIMES A DAY AS NEEDED  . ketoconazole (NIZORAL) 2 % cream Apply topically daily.  Marland Kitchen latanoprost (XALATAN)  0.005 % ophthalmic solution Place 1 drop into both eyes at bedtime.  Marland Kitchen levothyroxine (SYNTHROID) 88 MCG tablet Take 88 mcg by mouth daily before breakfast.  . linaclotide (LINZESS) 72 MCG capsule Take 1 capsule (72 mcg total) by mouth daily before breakfast.  . loratadine (CLARITIN) 10 MG tablet Take 1 tablet (10 mg total) by mouth daily.  . magnesium oxide (MAG-OX) 400 (241.3 Mg) MG tablet Take 1 tablet (400 mg total) by mouth daily. (Patient taking differently: Take 400 mg by mouth 2 (two) times daily. )  . Multiple Vitamin (MULTIVITAMIN WITH MINERALS) TABS tablet Take 1 tablet by mouth daily.  . potassium chloride (KLOR-CON) 10 MEQ tablet TAKE 2 TABLETS TWICE A DAY  . QUEtiapine (SEROQUEL) 25 MG tablet TAKE 3 TABLETS AT BEDTIME AS DIRECTED  . simvastatin (ZOCOR) 80 MG tablet TAKE 1 TABLET BY MOUTH DAILY..  . venlafaxine (EFFEXOR) 37.5 MG tablet Take 1 tablet (37.5 mg total) by mouth 2 (two) times daily.   Facility-Administered Encounter Medications as of 01/31/2020  Medication  . cyanocobalamin ((VITAMIN B-12)) injection 1,000 mcg    Goals    .  Client will talk  with [name] in next 30 days about managing anxiety issues for client (pt-stated)      Current Barriers:  . Client fatigues easily . Client has tracheostomy and has Chronic Diagnoses of CHF,GAD, Vitamin B12 deficiency, osteoarthritis and GERD   Clinical Social Work Clinical Goal(s):  . Client and [name] to communicate in next 30 days  to discuss client management of anxiety issues of client  Interventions: . [name] talked with [name], daughter of client, about nursing support with RNCM . [name] talked with Tomi Bamberger about [name] support . [name] talked with Ann Held about about in home care needs of client  . Talked with Tomi Bamberger about ambulation needs of client (she uses a cane to help her  walk) . Talked with Tomi Bamberger about meal provision for client . Talked with client previously about relaxation techniques of choice (she likes to watch  TV, or to sit on porch, talk on the phone with family members, likes to go out to eat) . Talked with Tomi Bamberger about vision of client . Talked with Tomi Bamberger about pain issues of client . Talked with Tomi Bamberger about client upcoming appointments . Talked with Tomi Bamberger about social support network for client  Patient Self Care Activities:  . Takes medications as prescribed . Attends scheduled medical appointments  Plan:   Client to attend scheduled medical appointments [name] to call client in next 4 weeks to talk with her about anxiety symptoms management of client Client to call RNCM as needed to discuss nursing needs of client Client to use relaxation techniques of choice to manage anxiety or stress symptoms  Initial goal documentation     Follow Up Plan: [name] to call client in next 4 weeks to talk with client about anxiety symptoms management for client  [name].Bertha Earwood MSW, [name] Licensed Clinical Social Worker [facility] [phone]

## 2020-01-31 NOTE — Patient Instructions (Addendum)
Licensed Clinical Social Worker Visit Information  Goals we discussed today:     .  Client will talk  with [name] in next 30 days about managing anxiety issues for client (pt-stated)       Current Barriers:   Client fatigues easily  Client has tracheostomy and has Chronic Diagnoses of CHF,GAD, Vitamin B12 deficiency, osteoarthritis and GERD   Clinical Social Work Clinical Goal(s):   Client and [name] to communicate in next 30 days to discuss client management of anxiety issues of client  Interventions:  [name] talked with [name], daughter of client, about nursing support with RNCM  [name] talked with Tomi Bamberger about [name] support  [name] talked with Ann Held about about in home care needs of client   Talked with Tomi Bamberger about ambulation needs of client (she uses a cane to help her  walk)  Talked with Tomi Bamberger about meal provision for client  Talked with client previously about relaxation techniques of choice (she likes to watch TV, or to sit on porch, talk on the phone with family members, likes to go out to eat)  Talked with Tomi Bamberger about vision of client  Talked with Tomi Bamberger about pain issues of client  Talked with Tomi Bamberger about client upcoming appointments  Talked with Tomi Bamberger about social support network for client  Patient Self Care Activities:   Takes medications as prescribed  Attends scheduled medical appointments  Plan:   Client to attend scheduled medical appointments [name] to call client in next 4 weeks to talk with her about anxiety symptoms management of client Client to call RNCM as needed to discuss nursing needs of client Client to use relaxation techniques of choice to manage anxiety or stress symptoms  Initial goal documentation     Follow Up Plan: [name] to call client in next 4 weeks to talk with client about anxiety symptoms management for client  Materials Provided: No  The patient/Marcia Bullins,daughter of patient, verbalized understanding  of instructions provided today and declined a print copy of patient instruction materials.   [name].Beni Turrell MSW, [name] Licensed Clinical Social Worker [facility] [phone]

## 2020-02-01 ENCOUNTER — Other Ambulatory Visit: Payer: Self-pay | Admitting: Family Medicine

## 2020-02-07 ENCOUNTER — Ambulatory Visit (INDEPENDENT_AMBULATORY_CARE_PROVIDER_SITE_OTHER): Payer: Medicare Other

## 2020-02-07 ENCOUNTER — Other Ambulatory Visit: Payer: Self-pay

## 2020-02-07 DIAGNOSIS — E538 Deficiency of other specified B group vitamins: Secondary | ICD-10-CM

## 2020-03-09 ENCOUNTER — Ambulatory Visit: Payer: Medicare Other | Admitting: Licensed Clinical Social Worker

## 2020-03-09 ENCOUNTER — Other Ambulatory Visit: Payer: Self-pay

## 2020-03-09 ENCOUNTER — Ambulatory Visit (INDEPENDENT_AMBULATORY_CARE_PROVIDER_SITE_OTHER): Payer: Medicare Other

## 2020-03-09 DIAGNOSIS — E538 Deficiency of other specified B group vitamins: Secondary | ICD-10-CM

## 2020-03-09 DIAGNOSIS — F411 Generalized anxiety disorder: Secondary | ICD-10-CM

## 2020-03-09 DIAGNOSIS — K219 Gastro-esophageal reflux disease without esophagitis: Secondary | ICD-10-CM

## 2020-03-09 NOTE — Chronic Care Management (AMB) (Signed)
Chronic Care Management    Clinical Social Work Follow Up Note  03/09/2020 Name: [name] MRN: [id number] DOB: Mar 06, 1941  [name] is a 79 y.o. year old female who is a primary care patient of [name], [name], MD. The CCM team was consulted for assistance with [name] .   Review of patient status, including review of consultants reports, other relevant assessments, and collaboration with appropriate care team members and the patient's provider was performed as part of comprehensive patient evaluation and provision of chronic care management services.    SDOH (Social Determinants of Health) assessments performed: No;risk for tobacco use; risk for depression; risk for stress; risk for physical inactivity    Chronic Care Management from 04/12/2019 in [place]  PHQ-9 Total Score 5       GAD 7 : Generalized Anxiety Score 04/12/2019  Nervous, Anxious, on Edge 1  Control/stop worrying 1  Worry too much - different things 1  Trouble relaxing 1  Restless 0  Easily annoyed or irritable 0  Afraid - awful might happen 0  Total GAD 7 Score 4  Anxiety Difficulty Somewhat difficult    Outpatient Encounter Medications as of 03/09/2020  Medication Sig  . QUEtiapine (SEROQUEL) 25 MG tablet TAKE 3 TABLETS AT BEDTIME AS DIRECTED  . aspirin 81 MG chewable tablet Chew 81 mg by mouth at bedtime.   . benzonatate (TESSALON) 200 MG capsule Take 1 capsule (200 mg total) by mouth 3 (three) times daily as needed.  . calcium carbonate (OS-CAL - DOSED IN MG OF ELEMENTAL CALCIUM) 1250 (500 Ca) MG tablet Take 1 tablet (500 mg of elemental calcium total) by mouth daily with breakfast.  . clonazePAM (KLONOPIN) 0.25 MG disintegrating tablet DISSOLVE 1 TABLET BY MOUTH 3 TIMES A DAY  . furosemide (LASIX) 20 MG tablet Take 10 mg by mouth daily. 10 mg po  Mon/Wed/Fridays  . hydrocortisone (ANUSOL-HC) 2.5 % rectal cream APPLY RECTALLY 2 TIMES A DAY AS NEEDED  .  ketoconazole (NIZORAL) 2 % cream Apply topically daily.  Marland Kitchen latanoprost (XALATAN) 0.005 % ophthalmic solution Place 1 drop into both eyes at bedtime.  Marland Kitchen levothyroxine (SYNTHROID) 88 MCG tablet Take 88 mcg by mouth daily before breakfast.  . linaclotide (LINZESS) 72 MCG capsule Take 1 capsule (72 mcg total) by mouth daily before breakfast.  . loratadine (CLARITIN) 10 MG tablet Take 1 tablet (10 mg total) by mouth daily.  . magnesium oxide (MAG-OX) 400 (241.3 Mg) MG tablet Take 1 tablet (400 mg total) by mouth daily. (Patient taking differently: Take 400 mg by mouth 2 (two) times daily. )  . Multiple Vitamin (MULTIVITAMIN WITH MINERALS) TABS tablet Take 1 tablet by mouth daily.  . potassium chloride (KLOR-CON) 10 MEQ tablet TAKE 2 TABLETS TWICE A DAY  . simvastatin (ZOCOR) 80 MG tablet TAKE 1 TABLET BY MOUTH DAILY..  . venlafaxine (EFFEXOR) 37.5 MG tablet Take 1 tablet (37.5 mg total) by mouth 2 (two) times daily.   Facility-Administered Encounter Medications as of 03/09/2020  Medication  . cyanocobalamin ((VITAMIN B-12)) injection 1,000 mcg    [name] called client home phone number several times today but [name] was not able to speak via phone with client today. However, [name] did leave phone message for client today requesting that Latrelle please return call to [name] at [phone].  Follow Up Plan: [name] to call client in next 4 weeks to talk with client about anxiety symptoms management for client  [name].Mea Ozga MSW, [name]  Licensed Clinical Social Worker [facility] [phone]

## 2020-03-09 NOTE — Patient Instructions (Addendum)
Licensed Clinical Social Worker Visit Information  Materials Provided: No  03/09/2020  Name: [name]            MRN: [id number]       DOB: 02/15/1941  [name] is a 79 y.o. year old female who is a primary care patient of [name], [name], MD. The CCM team was consulted for assistance with [name] .   Review of patient status, including review of consultants reports, other relevant assessments, and collaboration with appropriate care team members and the patient's provider was performed as part of comprehensive patient evaluation and provision of chronic care management services.    SDOH (Social Determinants of Health) assessments performed: No;risk for tobacco use; risk for depression; risk for stress; risk for physical inactivity  [name] called client home phone number several times today but [name] was not able to speak via phone with client today. However, [name] did leave phone message for client today requesting that L[place] please return call to [name] at [phone].  Follow Up Plan: [name] to call client in next 4 weeks to talk with client about anxiety symptoms management for client   [name] was not able to speak via phone with client today; thus the patient was not able to verbalize understanding of instructions provided today and was not able to accept or  decline a print copy of patient instruction materials.   [name].Demarr Kluever MSW, [name] Licensed Clinical Social Worker [facility] [phone]

## 2020-03-09 NOTE — Progress Notes (Signed)
Cyanocobalamin injection given to right deltoid.  Patient tolerated well. 

## 2020-03-10 ENCOUNTER — Encounter: Payer: Self-pay | Admitting: Family Medicine

## 2020-03-10 ENCOUNTER — Ambulatory Visit (INDEPENDENT_AMBULATORY_CARE_PROVIDER_SITE_OTHER): Payer: Medicare Other | Admitting: Family Medicine

## 2020-03-10 DIAGNOSIS — J40 Bronchitis, not specified as acute or chronic: Secondary | ICD-10-CM

## 2020-03-10 MED ORDER — AZITHROMYCIN 250 MG PO TABS: ORAL_TABLET | ORAL | 0 refills | Status: DC

## 2020-03-10 MED ORDER — PREDNISONE 20 MG PO TABS: ORAL_TABLET | ORAL | 0 refills | Status: DC

## 2020-03-10 NOTE — Progress Notes (Signed)
Virtual Visit via telephone Note  I connected with [name] on 03/10/20 at 1633 by telephone and verified that I am speaking with the correct person using two identifiers. [name] is currently located at home and patient are currently with her during visit. The provider, [name] Rosalie Gelpi, MD is located in their office at time of visit.  Call ended at 1640  I discussed the limitations, risks, security and privacy concerns of performing an evaluation and management service by telephone and the availability of in person appointments. I also discussed with the patient that there may be a patient responsible charge related to this service. The patient expressed understanding and agreed to proceed.   History and Present Illness: Patient is having congestion coming up through tracheal tube and coughing green sputum. She is taking robitussin and cough drops. She is having a lot of coughing but denies any fevers or chills. She denies sick contacts.  She denies fevers or chills or body aches or loss of taste or smell. They use suction on trachea tube.  She feels that it is in nose and throat.   No diagnosis found.  Outpatient Encounter Medications as of 03/10/2020  Medication Sig  . QUEtiapine (SEROQUEL) 25 MG tablet TAKE 3 TABLETS AT BEDTIME AS DIRECTED  . aspirin 81 MG chewable tablet Chew 81 mg by mouth at bedtime.   . benzonatate (TESSALON) 200 MG capsule Take 1 capsule (200 mg total) by mouth 3 (three) times daily as needed.  . calcium carbonate (OS-CAL - DOSED IN MG OF ELEMENTAL CALCIUM) 1250 (500 Ca) MG tablet Take 1 tablet (500 mg of elemental calcium total) by mouth daily with breakfast.  . clonazePAM (KLONOPIN) 0.25 MG disintegrating tablet DISSOLVE 1 TABLET BY MOUTH 3 TIMES A DAY  . furosemide (LASIX) 20 MG tablet Take 10 mg by mouth daily. 10 mg po  Mon/Wed/Fridays  . hydrocortisone (ANUSOL-HC) 2.5 % rectal cream APPLY RECTALLY 2 TIMES A DAY AS NEEDED  . ketoconazole  (NIZORAL) 2 % cream Apply topically daily.  Marland Kitchen latanoprost (XALATAN) 0.005 % ophthalmic solution Place 1 drop into both eyes at bedtime.  Marland Kitchen levothyroxine (SYNTHROID) 88 MCG tablet Take 88 mcg by mouth daily before breakfast.  . linaclotide (LINZESS) 72 MCG capsule Take 1 capsule (72 mcg total) by mouth daily before breakfast.  . loratadine (CLARITIN) 10 MG tablet Take 1 tablet (10 mg total) by mouth daily.  . magnesium oxide (MAG-OX) 400 (241.3 Mg) MG tablet Take 1 tablet (400 mg total) by mouth daily. (Patient taking differently: Take 400 mg by mouth 2 (two) times daily. )  . Multiple Vitamin (MULTIVITAMIN WITH MINERALS) TABS tablet Take 1 tablet by mouth daily.  . potassium chloride (KLOR-CON) 10 MEQ tablet TAKE 2 TABLETS TWICE A DAY  . simvastatin (ZOCOR) 80 MG tablet TAKE 1 TABLET BY MOUTH DAILY..  . venlafaxine (EFFEXOR) 37.5 MG tablet Take 1 tablet (37.5 mg total) by mouth 2 (two) times daily.   Facility-Administered Encounter Medications as of 03/10/2020  Medication  . cyanocobalamin ((VITAMIN B-12)) injection 1,000 mcg    Review of Systems  Constitutional: Negative for chills and fever.  HENT: Positive for congestion, postnasal drip and rhinorrhea. Negative for ear discharge, ear pain, sinus pressure, sneezing and sore throat.   Eyes: Negative for pain, redness and visual disturbance.  Respiratory: Positive for cough. Negative for chest tightness, shortness of breath and wheezing.   Cardiovascular: Negative for chest pain and leg swelling.  Genitourinary: Negative for  difficulty urinating and dysuria.  Musculoskeletal: Negative for back pain and gait problem.  Skin: Negative for rash.  Neurological: Negative for light-headedness and headaches.  Psychiatric/Behavioral: Negative for agitation and behavioral problems.  All other systems reviewed and are negative.   Observations/Objective: Patient sounds comfortable and in no acute distress  Assessment and Plan: Problem List Items  Addressed This Visit    None    Visit Diagnoses    Bronchitis    -  Primary   Relevant Medications   azithromycin (ZITHROMAX) 250 MG tablet   predniSONE (DELTASONE) 20 MG tablet      Will treat like bronchitis Follow up plan: Return if symptoms worsen or fail to improve.     I discussed the assessment and treatment plan with the patient. The patient was provided an opportunity to ask questions and all were answered. The patient agreed with the plan and demonstrated an understanding of the instructions.   The patient was advised to call back or seek an in-person evaluation if the symptoms worsen or if the condition fails to improve as anticipated.  The above assessment and management plan was discussed with the patient. The patient verbalized understanding of and has agreed to the management plan. Patient is aware to call the clinic if symptoms persist or worsen. Patient is aware when to return to the clinic for a follow-up visit. Patient educated on when it is appropriate to go to the emergency department.    I provided 7 minutes of non-face-to-face time during this encounter.    Worthy Rancher, MD

## 2020-03-30 ENCOUNTER — Other Ambulatory Visit: Payer: Self-pay | Admitting: Family Medicine

## 2020-04-01 ENCOUNTER — Other Ambulatory Visit: Payer: Self-pay | Admitting: Family Medicine

## 2020-04-10 ENCOUNTER — Encounter: Payer: Self-pay | Admitting: Family Medicine

## 2020-04-10 ENCOUNTER — Ambulatory Visit: Payer: Medicare Other | Admitting: Family Medicine

## 2020-04-10 ENCOUNTER — Other Ambulatory Visit: Payer: Self-pay

## 2020-04-10 VITALS — BP 122/72 | HR 87 | Temp 98.0°F | Ht 65.0 in | Wt 189.0 lb

## 2020-04-10 DIAGNOSIS — Z23 Encounter for immunization: Secondary | ICD-10-CM | POA: Diagnosis not present

## 2020-04-10 DIAGNOSIS — E7800 Pure hypercholesterolemia, unspecified: Secondary | ICD-10-CM

## 2020-04-10 DIAGNOSIS — I10 Essential (primary) hypertension: Secondary | ICD-10-CM

## 2020-04-10 DIAGNOSIS — K219 Gastro-esophageal reflux disease without esophagitis: Secondary | ICD-10-CM | POA: Diagnosis not present

## 2020-04-10 DIAGNOSIS — F411 Generalized anxiety disorder: Secondary | ICD-10-CM

## 2020-04-10 MED ORDER — VENLAFAXINE HCL 50 MG PO TABS: 50.0000 mg | ORAL_TABLET | Freq: Two times a day (BID) | ORAL | 3 refills | Status: DC

## 2020-04-10 MED ORDER — CLONAZEPAM 0.25 MG PO TBDP: ORAL_TABLET | ORAL | 2 refills | Status: DC

## 2020-04-10 NOTE — Progress Notes (Signed)
BP 122/72    Pulse 87    Temp 98 F (36.7 C)    Ht 5' 5"  (1.651 m)    Wt 189 lb (85.7 kg)    SpO2 99%    BMI 31.45 kg/m    Subjective:   Patient ID: [name], female    DOB: [birth year], 79 y.o.   MRN: [id number]  HPI: [name] is a 79 y.o. female presenting on 04/10/2020 for Medical Management of Chronic Issues   HPI Hypertension Patient is currently on furosemide, and their blood pressure today is 122/72. Patient denies any lightheadedness or dizziness. Patient denies headaches, blurred vision, chest pains, shortness of breath, or weakness. Denies any side effects from medication and is content with current medication.   Hyperlipidemia Patient is coming in for recheck of his hyperlipidemia. The patient is currently taking simvastatin. They deny any issues with myalgias or history of liver damage from it. They deny any focal numbness or weakness or chest pain.   GERD Patient is currently on no medication currently has been controlled..  She denies any major symptoms or abdominal pain or belching or burping. She denies any blood in her stool or lightheadedness or dizziness.    Anxiety recheck Patient is coming in today for anxiety recheck. She currently takes clonazepam 0.25 mg 3 times daily as needed and Effexor 37.5 mg twice daily and Seroquel at bedtime. She does says recently over the past few weeks she has been getting a lot more anxiety and has not been under control. A lot of her anxiety since from her health and especially the tracheal issues that she has from the tracheostomy.  Relevant past medical, surgical, family and social history reviewed and updated as indicated. Interim medical history since our last visit reviewed. Allergies and medications reviewed and updated.  Review of Systems  Constitutional: Negative for chills and fever.  Eyes: Negative for visual disturbance.  Respiratory: Negative for chest tightness and shortness of breath.   Cardiovascular:  Negative for chest pain and leg swelling.  Musculoskeletal: Negative for back pain and gait problem.  Skin: Negative for rash.  Neurological: Negative for light-headedness and headaches.  Psychiatric/Behavioral: Positive for dysphoric mood. Negative for agitation, behavioral problems, self-injury, sleep disturbance and suicidal ideas. The patient is nervous/anxious.   All other systems reviewed and are negative.   Per HPI unless specifically indicated above   Allergies as of 04/10/2020      Reactions   Lipitor [atorvastatin Calcium] Other (See Comments)   myalgias   Lyrica [pregabalin] Other (See Comments)   SORES IN MOUTH    Sibutramine Hcl Monohydrate Other (See Comments)   UNSPECIFIED REACTION    Alendronate Sodium Nausea Only   Latex Rash, Other (See Comments)   RA to latex 1982; includes bandaids    Levofloxacin Nausea And Vomiting   Meloxicam Other (See Comments)   Vaginal itching       Medication List       Accurate as of April 10, 2020 11:24 AM. If you have any questions, ask your nurse or doctor.        STOP taking these medications   azithromycin 250 MG tablet Commonly known as: ZITHROMAX Stopped by: [name] Elnita Surprenant, MD     TAKE these medications   aspirin 81 MG chewable tablet Chew 81 mg by mouth at bedtime.   benzonatate 200 MG capsule Commonly known as: TESSALON Take 1 capsule (200 mg total) by mouth 3 (three) times daily  as needed.   calcium carbonate 1250 (500 Ca) MG tablet Commonly known as: OS-CAL - dosed in mg of elemental calcium Take 1 tablet (500 mg of elemental calcium total) by mouth daily with breakfast.   clonazePAM 0.25 MG disintegrating tablet Commonly known as: KLONOPIN DISSOLVE 1 TABLET BY MOUTH 3 TIMES A DAY   furosemide 20 MG tablet Commonly known as: LASIX Take 10 mg by mouth daily. 10 mg po  Mon/Wed/Fridays   hydrocortisone 2.5 % rectal cream Commonly known as: ANUSOL-HC APPLY RECTALLY 2 TIMES A DAY AS NEEDED     ketoconazole 2 % cream Commonly known as: NIZORAL Apply topically daily.   latanoprost 0.005 % ophthalmic solution Commonly known as: XALATAN Place 1 drop into both eyes at bedtime.   levothyroxine 88 MCG tablet Commonly known as: SYNTHROID Take 88 mcg by mouth daily before breakfast.   linaclotide 72 MCG capsule Commonly known as: Linzess Take 1 capsule (72 mcg total) by mouth daily before breakfast.   loratadine 10 MG tablet Commonly known as: CLARITIN Take 1 tablet (10 mg total) by mouth daily.   magnesium oxide 400 (241.3 Mg) MG tablet Commonly known as: MAG-OX Take 1 tablet (400 mg total) by mouth daily. What changed: when to take this   multivitamin with minerals Tabs tablet Take 1 tablet by mouth daily.   potassium chloride 10 MEQ tablet Commonly known as: KLOR-CON TAKE 2 TABLETS TWICE A DAY   predniSONE 20 MG tablet Commonly known as: DELTASONE 2 po at same time daily for 5 days   QUEtiapine 25 MG tablet Commonly known as: SEROQUEL TAKE 3 TABLETS AT BEDTIME AS DIRECTED   simvastatin 80 MG tablet Commonly known as: ZOCOR TAKE 1 TABLET BY MOUTH DAILY.Marland Kitchen   venlafaxine 50 MG tablet Commonly known as: EFFEXOR Take 1 tablet (50 mg total) by mouth 2 (two) times daily with a meal. What changed:   medication strength  See the new instructions. Changed by: [name] Aneita Kiger, MD        Objective:   BP 122/72    Pulse 87    Temp 98 F (36.7 C)    Ht 5' 5"  (1.651 m)    Wt 189 lb (85.7 kg)    SpO2 99%    BMI 31.45 kg/m   Wt Readings from Last 3 Encounters:  04/10/20 189 lb (85.7 kg)  12/13/19 188 lb (85.3 kg)  10/15/19 182 lb 6.4 oz (82.7 kg)    Physical Exam Vitals and nursing note reviewed.  Constitutional:      General: She is not in acute distress.    Appearance: She is well-developed. She is not diaphoretic.  Eyes:     Conjunctiva/sclera: Conjunctivae normal.  Cardiovascular:     Rate and Rhythm: Normal rate and regular rhythm.     Heart  sounds: Normal heart sounds. No murmur heard.   Pulmonary:     Effort: Pulmonary effort is normal. No respiratory distress.     Breath sounds: Normal breath sounds. No wheezing.  Musculoskeletal:        General: No tenderness. Normal range of motion.  Skin:    General: Skin is warm and dry.     Findings: No rash.  Neurological:     Mental Status: She is alert and oriented to person, place, and time.     Coordination: Coordination normal.  Psychiatric:        Mood and Affect: Mood is anxious. Mood is not depressed.  Behavior: Behavior normal.        Thought Content: Thought content does not include suicidal ideation. Thought content does not include suicidal plan.       Assessment & Plan:   Problem List Items Addressed This Visit      Cardiovascular and Mediastinum   Essential hypertension - Primary   Relevant Orders   CMP14+EGFR     Digestive   GERD   Relevant Orders   CBC with Differential/Platelet     Other   GAD (generalized anxiety disorder)   Relevant Medications   venlafaxine (EFFEXOR) 50 MG tablet   clonazePAM (KLONOPIN) 0.25 MG disintegrating tablet   Pure hypercholesterolemia   Relevant Orders   Lipid panel    Other Visit Diagnoses    Flu vaccine need       Relevant Orders   Flu Vaccine QUAD High Dose(Fluad) (Completed)      Will increase the venlafaxine for anxiety, continue current dose of the clonazepam, continue all other current medication. Follow up plan: Return in about 4 weeks (around 05/08/2020), or if symptoms worsen or fail to improve, for Anxiety recheck.  Counseling provided for all of the vaccine components Orders Placed This Encounter  Procedures   Flu Vaccine QUAD High Dose(Fluad)   CBC with Differential/Platelet   CMP14+EGFR   Lipid panel    Caryl Pina, MD [facility] 04/10/2020, 11:24 AM

## 2020-04-11 LAB — CBC WITH DIFFERENTIAL/PLATELET
Basophils Absolute: 0 10*3/uL (ref 0.0–0.2)
Basos: 1 %
EOS (ABSOLUTE): 0.2 10*3/uL (ref 0.0–0.4)
Eos: 3 %
Hematocrit: 36.2 % (ref 34.0–46.6)
Hemoglobin: 12.2 g/dL (ref 11.1–15.9)
Immature Grans (Abs): 0 10*3/uL (ref 0.0–0.1)
Immature Granulocytes: 0 %
Lymphocytes Absolute: 1.4 10*3/uL (ref 0.7–3.1)
Lymphs: 26 %
MCH: 29.1 pg (ref 26.6–33.0)
MCHC: 33.7 g/dL (ref 31.5–35.7)
MCV: 86 fL (ref 79–97)
Monocytes Absolute: 0.5 10*3/uL (ref 0.1–0.9)
Monocytes: 9 %
Neutrophils Absolute: 3.3 10*3/uL (ref 1.4–7.0)
Neutrophils: 61 %
Platelets: 212 10*3/uL (ref 150–450)
RBC: 4.19 x10E6/uL (ref 3.77–5.28)
RDW: 12.9 % (ref 11.7–15.4)
WBC: 5.4 10*3/uL (ref 3.4–10.8)

## 2020-04-11 LAB — CMP14+EGFR
ALT: 11 IU/L (ref 0–32)
AST: 22 IU/L (ref 0–40)
Albumin/Globulin Ratio: 2.1 (ref 1.2–2.2)
Albumin: 4.5 g/dL (ref 3.7–4.7)
Alkaline Phosphatase: 112 IU/L (ref 44–121)
BUN/Creatinine Ratio: 16 (ref 12–28)
BUN: 15 mg/dL (ref 8–27)
Bilirubin Total: 0.3 mg/dL (ref 0.0–1.2)
CO2: 26 mmol/L (ref 20–29)
Calcium: 8.6 mg/dL — ABNORMAL LOW (ref 8.7–10.3)
Chloride: 103 mmol/L (ref 96–106)
Creatinine, Ser: 0.94 mg/dL (ref 0.57–1.00)
GFR calc Af Amer: 68 mL/min/{1.73_m2} (ref >59)
GFR calc non Af Amer: 59 mL/min/{1.73_m2} — ABNORMAL LOW (ref >59)
Globulin, Total: 2.1 g/dL (ref 1.5–4.5)
Glucose: 80 mg/dL (ref 65–99)
Potassium: 4.1 mmol/L (ref 3.5–5.2)
Sodium: 143 mmol/L (ref 134–144)
Total Protein: 6.6 g/dL (ref 6.0–8.5)

## 2020-04-11 LAB — LIPID PANEL
Chol/HDL Ratio: 2.1 ratio (ref 0.0–4.4)
Cholesterol, Total: 162 mg/dL (ref 100–199)
HDL: 76 mg/dL (ref >39)
LDL Chol Calc (NIH): 70 mg/dL (ref 0–99)
Triglycerides: 84 mg/dL (ref 0–149)
VLDL Cholesterol Cal: 16 mg/dL (ref 5–40)

## 2020-04-13 MED ORDER — VENLAFAXINE HCL 50 MG PO TABS: 50.0000 mg | ORAL_TABLET | Freq: Two times a day (BID) | ORAL | 3 refills | Status: DC

## 2020-04-13 MED ORDER — CLONAZEPAM 0.25 MG PO TBDP: ORAL_TABLET | ORAL | 2 refills | Status: DC

## 2020-04-13 NOTE — Addendum Note (Signed)
Addended by: Antonietta Barcelona D on: 04/13/2020 09:46 AM   Modules accepted: Orders

## 2020-04-13 NOTE — Addendum Note (Signed)
Addended by: Caryl Pina on: 04/13/2020 11:58 AM   Modules accepted: Orders

## 2020-04-13 NOTE — Progress Notes (Signed)
E-prescribe down. Resent Venlafaxine.

## 2020-04-17 ENCOUNTER — Telehealth: Payer: Medicare Other

## 2020-04-20 ENCOUNTER — Other Ambulatory Visit: Payer: Self-pay | Admitting: Family Medicine

## 2020-04-21 DIAGNOSIS — J386 Stenosis of larynx: Secondary | ICD-10-CM | POA: Diagnosis not present

## 2020-04-21 DIAGNOSIS — Z930 Tracheostomy status: Secondary | ICD-10-CM | POA: Diagnosis not present

## 2020-04-21 DIAGNOSIS — R490 Dysphonia: Secondary | ICD-10-CM | POA: Diagnosis not present

## 2020-04-21 DIAGNOSIS — Z430 Encounter for attention to tracheostomy: Secondary | ICD-10-CM | POA: Diagnosis not present

## 2020-04-21 DIAGNOSIS — R0602 Shortness of breath: Secondary | ICD-10-CM | POA: Diagnosis not present

## 2020-04-27 DIAGNOSIS — Z85850 Personal history of malignant neoplasm of thyroid: Secondary | ICD-10-CM | POA: Diagnosis not present

## 2020-04-27 DIAGNOSIS — E890 Postprocedural hypothyroidism: Secondary | ICD-10-CM | POA: Diagnosis not present

## 2020-04-30 ENCOUNTER — Other Ambulatory Visit: Payer: Self-pay | Admitting: Family Medicine

## 2020-04-30 DIAGNOSIS — R5383 Other fatigue: Secondary | ICD-10-CM | POA: Diagnosis not present

## 2020-04-30 DIAGNOSIS — E890 Postprocedural hypothyroidism: Secondary | ICD-10-CM | POA: Diagnosis not present

## 2020-04-30 DIAGNOSIS — K5900 Constipation, unspecified: Secondary | ICD-10-CM | POA: Diagnosis not present

## 2020-04-30 DIAGNOSIS — Z85850 Personal history of malignant neoplasm of thyroid: Secondary | ICD-10-CM | POA: Diagnosis not present

## 2020-05-01 ENCOUNTER — Other Ambulatory Visit: Payer: Self-pay | Admitting: Family Medicine

## 2020-05-13 ENCOUNTER — Ambulatory Visit: Payer: Medicare Other | Admitting: Cardiothoracic Surgery

## 2020-05-14 ENCOUNTER — Telehealth: Payer: Self-pay

## 2020-05-14 ENCOUNTER — Telehealth: Payer: Medicare Other

## 2020-05-14 ENCOUNTER — Other Ambulatory Visit: Payer: Medicare Other

## 2020-05-14 ENCOUNTER — Other Ambulatory Visit: Payer: Self-pay

## 2020-05-14 DIAGNOSIS — E890 Postprocedural hypothyroidism: Secondary | ICD-10-CM | POA: Diagnosis not present

## 2020-05-14 NOTE — Telephone Encounter (Signed)
Pt had an appt to come in for labs on 05/15/20. Based off of last visit and labs the patient does not need more labs per [name].   Pt does need an appt. Please see if pt can come on 12/1. It is [name]'s on call day but will open an appt for a 4 week follow up to re check anxiety.  Left message for pt to return call.

## 2020-05-15 ENCOUNTER — Encounter: Payer: Self-pay | Admitting: Family Medicine

## 2020-05-15 ENCOUNTER — Ambulatory Visit: Payer: Medicare Other | Admitting: Family Medicine

## 2020-05-15 ENCOUNTER — Other Ambulatory Visit: Payer: Self-pay

## 2020-05-15 VITALS — BP 125/71 | HR 86 | Temp 97.0°F | Ht 65.0 in | Wt 187.0 lb

## 2020-05-15 DIAGNOSIS — E039 Hypothyroidism, unspecified: Secondary | ICD-10-CM | POA: Diagnosis not present

## 2020-05-15 DIAGNOSIS — F411 Generalized anxiety disorder: Secondary | ICD-10-CM | POA: Diagnosis not present

## 2020-05-15 MED ORDER — DULOXETINE HCL 30 MG PO CPEP: 30.0000 mg | ORAL_CAPSULE | Freq: Every day | ORAL | 2 refills | Status: DC

## 2020-05-15 NOTE — Progress Notes (Signed)
BP 125/71   Pulse 86   Temp (!) 97 F (36.1 C)   Ht 5\' 5"  (1.651 m)   Wt 187 lb (84.8 kg)   SpO2 99%   BMI 31.12 kg/m    Subjective:   Patient ID: [name], female    DOB: [birth year], 79 y.o.   MRN: [id number]  HPI: [name] is a 79 y.o. female presenting on 05/15/2020 for No chief complaint on file.   HPI Patient is coming in for anxiety recheck.  She tried the Effexor for 3 days and said it made her feel a little jittery and so she stopped it, she did not tried any further or not.  She says she is still very anxious and has mild essential tremor as well and wants to see if anything can help without a little bit better.  A lot of her anxiety stems from the fact that she has her trach and has to breathe through that and it does make her more anxious.  Hypothyroidism recheck Patient is coming in for thyroid recheck today as well. They deny any issues with hair changes or heat or cold problems or diarrhea or constipation. They deny any chest pain or palpitations. They are currently on levothyroxine 149micrograms patient sees Dr. Buddy Duty for thyroid  Relevant past medical, surgical, family and social history reviewed and updated as indicated. Interim medical history since our last visit reviewed. Allergies and medications reviewed and updated.  Review of Systems  Constitutional: Negative for chills and fever.  Eyes: Negative for visual disturbance.  Respiratory: Negative for chest tightness and shortness of breath.   Cardiovascular: Negative for chest pain and leg swelling.  Musculoskeletal: Negative for back pain and gait problem.  Skin: Negative for rash.  Neurological: Negative for light-headedness and headaches.  Psychiatric/Behavioral: Negative for agitation, behavioral problems, dysphoric mood, self-injury, sleep disturbance and suicidal ideas. The patient is nervous/anxious.   All other systems reviewed and are negative.   Per HPI unless specifically indicated  above   Allergies as of 05/15/2020      Reactions   Lipitor [atorvastatin Calcium] Other (See Comments)   myalgias   Lyrica [pregabalin] Other (See Comments)   SORES IN MOUTH    Sibutramine Hcl Monohydrate Other (See Comments)   UNSPECIFIED REACTION    Alendronate Sodium Nausea Only   Latex Rash, Other (See Comments)   RA to latex 1982; includes bandaids    Levofloxacin Nausea And Vomiting   Meloxicam Other (See Comments)   Vaginal itching       Medication List       Accurate as of May 15, 2020 10:35 AM. If you have any questions, ask your nurse or doctor.        STOP taking these medications   venlafaxine 37.5 MG tablet Commonly known as: EFFEXOR Stopped by: [name] Jessel Gettinger, MD   venlafaxine 50 MG tablet Commonly known as: EFFEXOR Stopped by: [name] Gertie Broerman, MD     TAKE these medications   aspirin 81 MG chewable tablet Chew 81 mg by mouth at bedtime.   benzonatate 200 MG capsule Commonly known as: TESSALON Take 1 capsule (200 mg total) by mouth 3 (three) times daily as needed.   calcium carbonate 1250 (500 Ca) MG tablet Commonly known as: OS-CAL - dosed in mg of elemental calcium Take 1 tablet (500 mg of elemental calcium total) by mouth daily with breakfast.   clonazePAM 0.25 MG disintegrating tablet Commonly known as: KLONOPIN DISSOLVE  1 TABLET BY MOUTH 3 TIMES A DAY   DULoxetine 30 MG capsule Commonly known as: Cymbalta Take 1 capsule (30 mg total) by mouth daily. Started by: Worthy Rancher, MD   furosemide 20 MG tablet Commonly known as: LASIX Take 10 mg by mouth daily. 10 mg po  Mon/Wed/Fridays   hydrocortisone 2.5 % rectal cream Commonly known as: ANUSOL-HC APPLY RECTALLY 2 TIMES A DAY AS NEEDED   ketoconazole 2 % cream Commonly known as: NIZORAL Apply topically daily.   latanoprost 0.005 % ophthalmic solution Commonly known as: XALATAN Place 1 drop into both eyes at bedtime.   levothyroxine 88 MCG tablet Commonly known  as: SYNTHROID Take 88 mcg by mouth daily before breakfast.   linaclotide 72 MCG capsule Commonly known as: Linzess Take 1 capsule (72 mcg total) by mouth daily before breakfast.   loratadine 10 MG tablet Commonly known as: CLARITIN Take 1 tablet (10 mg total) by mouth daily.   magnesium oxide 400 (241.3 Mg) MG tablet Commonly known as: MAG-OX Take 1 tablet (400 mg total) by mouth daily. What changed: when to take this   multivitamin with minerals Tabs tablet Take 1 tablet by mouth daily.   potassium chloride 10 MEQ tablet Commonly known as: KLOR-CON TAKE 2 TABLETS TWICE A DAY   predniSONE 20 MG tablet Commonly known as: DELTASONE 2 po at same time daily for 5 days   QUEtiapine 25 MG tablet Commonly known as: SEROQUEL TAKE 3 TABLETS AT BEDTIME AS DIRECTED   simvastatin 80 MG tablet Commonly known as: ZOCOR TAKE 1 TABLET BY MOUTH DAILY.Marland Kitchen        Objective:   BP 125/71   Pulse 86   Temp (!) 97 F (36.1 C)   Ht 5\' 5"  (1.651 m)   Wt 187 lb (84.8 kg)   SpO2 99%   BMI 31.12 kg/m   Wt Readings from Last 3 Encounters:  05/15/20 187 lb (84.8 kg)  04/10/20 189 lb (85.7 kg)  12/13/19 188 lb (85.3 kg)    Physical Exam Vitals and nursing note reviewed.  Constitutional:      General: She is not in acute distress.    Appearance: She is well-developed. She is not diaphoretic.  Eyes:     Conjunctiva/sclera: Conjunctivae normal.  Cardiovascular:     Rate and Rhythm: Normal rate and regular rhythm.     Heart sounds: Murmur heard.   Pulmonary:     Effort: Pulmonary effort is normal. No respiratory distress.     Breath sounds: Normal breath sounds. No wheezing.  Musculoskeletal:        General: No tenderness. Normal range of motion.  Skin:    General: Skin is warm and dry.     Findings: No rash.  Neurological:     Mental Status: She is alert and oriented to person, place, and time.     Coordination: Coordination normal.  Psychiatric:        Mood and Affect: Mood  is anxious. Mood is not depressed.        Behavior: Behavior normal.        Thought Content: Thought content does not include suicidal ideation. Thought content does not include suicidal plan.       Assessment & Plan:   Problem List Items Addressed This Visit      Endocrine   Hypothyroidism   Relevant Orders   TSH     Other   GAD (generalized anxiety disorder) - Primary   Relevant  Medications   DULoxetine (CYMBALTA) 30 MG capsule      Will check thyroid level and sent to Dr. Buddy Duty Follow up plan: Return if symptoms worsen or fail to improve, for 4 to 5-week anxiety recheck.  Counseling provided for all of the vaccine components Orders Placed This Encounter  Procedures  . TSH    Caryl Pina, MD [facility] 05/15/2020, 10:35 AM

## 2020-05-16 LAB — TSH: TSH: 2.63 u[IU]/mL (ref 0.450–4.500)

## 2020-05-18 ENCOUNTER — Telehealth: Payer: Self-pay

## 2020-05-18 DIAGNOSIS — J386 Stenosis of larynx: Secondary | ICD-10-CM | POA: Diagnosis not present

## 2020-05-18 DIAGNOSIS — Z930 Tracheostomy status: Secondary | ICD-10-CM | POA: Diagnosis not present

## 2020-05-18 NOTE — Telephone Encounter (Signed)
Pt did not have an appt on 12/1 at our office. Confirmed with husband that pt will be able to attend the 12/17 appt.

## 2020-05-18 NOTE — Telephone Encounter (Signed)
Husband aware and states she has an appointment in [place] on 12/1 so she can not come in that day.  Patient already has an appointment set up with [name] on 12/17.  Husband aware I will send this appointment to Dr. Neldon Mc nurse and if she needs to move her appointment up then she will call back.  Husband would like a detailed message left on VM if they are not home.

## 2020-05-18 NOTE — Telephone Encounter (Signed)
R/C to [employer]

## 2020-05-26 ENCOUNTER — Other Ambulatory Visit: Payer: Self-pay | Admitting: Cardiothoracic Surgery

## 2020-05-26 DIAGNOSIS — I712 Thoracic aortic aneurysm, without rupture: Secondary | ICD-10-CM

## 2020-05-26 DIAGNOSIS — Z952 Presence of prosthetic heart valve: Secondary | ICD-10-CM

## 2020-05-26 DIAGNOSIS — I7121 Aneurysm of the ascending aorta, without rupture: Secondary | ICD-10-CM

## 2020-05-27 ENCOUNTER — Other Ambulatory Visit: Payer: Self-pay

## 2020-05-27 ENCOUNTER — Encounter: Payer: Self-pay | Admitting: Cardiothoracic Surgery

## 2020-05-27 ENCOUNTER — Ambulatory Visit: Payer: Medicare Other | Admitting: Cardiothoracic Surgery

## 2020-05-27 ENCOUNTER — Ambulatory Visit
Admission: RE | Admit: 2020-05-27 | Discharge: 2020-05-27 | Disposition: A | Discharge status: Home / Self Care | Payer: Medicare Other | Source: Ambulatory Visit | Attending: Cardiothoracic Surgery | Admitting: Cardiothoracic Surgery

## 2020-05-27 VITALS — BP 107/68 | HR 82 | Resp 20 | Ht 65.0 in | Wt 187.0 lb

## 2020-05-27 DIAGNOSIS — I712 Thoracic aortic aneurysm, without rupture, unspecified: Secondary | ICD-10-CM

## 2020-05-27 DIAGNOSIS — J9811 Atelectasis: Secondary | ICD-10-CM | POA: Diagnosis not present

## 2020-05-27 DIAGNOSIS — Z952 Presence of prosthetic heart valve: Secondary | ICD-10-CM

## 2020-05-27 DIAGNOSIS — I7121 Aneurysm of the ascending aorta, without rupture: Secondary | ICD-10-CM

## 2020-05-27 DIAGNOSIS — I517 Cardiomegaly: Secondary | ICD-10-CM | POA: Diagnosis not present

## 2020-05-27 MED ORDER — AZITHROMYCIN 250 MG PO TABS: ORAL_TABLET | ORAL | 0 refills | Status: DC

## 2020-05-27 MED ORDER — GUAIFENESIN ER 600 MG PO TB12: 600.0000 mg | ORAL_TABLET | Freq: Two times a day (BID) | ORAL | 0 refills | Status: DC | PRN

## 2020-05-27 NOTE — Addendum Note (Signed)
Addended by: Floyde Parkins MD, Abubakar Crispo on: 05/27/2020 03:16 PM   Modules accepted: Orders

## 2020-05-27 NOTE — Progress Notes (Signed)
PCP is [name], [name], MD Referring Provider is [name], [name], MD  Chief Complaint  Patient presents with  . Follow-up    hx of bentall, cxr today    HPI: Patient returns for final 3-year postop visit after surgery for aortic valve replacement with a Edwards 21 mm magna ease tissue valve and replacement of a 5 5.4 cm ascending thoracic aneurysm with a 32 mm Hemashield graft.  The patient had a temporary CentriMag RVAD postop.  She had postop pulmonary insufficiency and required tracheostomy.  The tracheostomy could not be weaned off due to tracheal scarring and she has lived at home now for 3 years with a #4 cuffless tracheostomy.  He is followed by Dr. Redmond Baseman in [facility] and ENT surgeon at [facility]  Her cardiologist is Dr. Percival Spanish.  She is maintaining a sinus rhythm with good LV function and good function of the prosthetic valve on her echo last year.  She has had a postoperative CTA which shows the graft replacement of the aneurysm to be intact without pseudoaneurysm or stenosis.  Patient remains active taking care of her home and living with her husband.  She has not been hospitalized since her surgery 3 years ago.  She denies any symptoms of heart failure or angina.  Her main difficulty is airway secretions through her trach related to tracheal scarring and tracheal bronchitis.  Past Medical History:  Diagnosis Date  . ACL tear    right  Dr. Gladstone Pih   . Adenomatous polyp   . Anxiety   . Arthritis    "all over" (04/12/2018)  . Ascending aortic aneurysm ([place])    note per chart per Dr Lucianne Lei Tright 4.7 cm 04/15/2015   . Asthma   . B12 deficiency   . Chronic bronchitis ([place])   . Depression   . DUB (dysfunctional uterine bleeding) 10/96  . Fibromyalgia   . GERD (gastroesophageal reflux disease)   . Glaucoma    bilaterally  . Helicobacter pylori (H. pylori)   . Hiatal hernia   . History of blood transfusion 06/2017   "related to OHS"  . History of kidney stones   .  History of left shoulder fracture    pt states fell off bed and broke ball in shoulder had rod placed   . Hyperlipidemia   . Hypertension   . Hypothyroidism   . Inspiratory stridor   . Laryngeal stenosis   . Occasional tremors    left arm   . Osteoarthritis   . Osteopenia   . Osteoporosis   . Thyroid cancer ([place]) 2017  . Thyroid nodule   . Tracheostomy in place [facility])     Past Surgical History:  Procedure Laterality Date  . AORTIC VALVE REPLACEMENT N/A 07/10/2017   Procedure: AORTIC VALVE REPLACEMENT (AVR);  Surgeon: [name], Collier Salina, MD;  Location: [place];  Service: Open Heart Surgery;  Laterality: N/A;  . APPENDECTOMY    . BUNIONECTOMY  08/1999   right - Dr. Irving Shows   . CARDIAC VALVE REPLACEMENT    . CATARACT EXTRACTION, BILATERAL Bilateral 05/28/07-2009   "right-left"  . CHOLECYSTECTOMY OPEN  1979  . DILATION AND CURETTAGE OF UTERUS  03/31/95   Dr. Ovid Curd   . DIRECT LARYNGOSCOPY WITH BOTOX INJECTION N/A 12/01/2017   Procedure: SUSPENDED DIRECT MICROLARYNGOSCOPY WITH KENALOG INJECTION, CO2 LASER;  Surgeon: Melida Quitter, MD;  Location: [place];  Service: ENT;  Laterality: N/A;  . EXCISIONAL HEMORRHOIDECTOMY    . EXPLORATION POST OPERATIVE OPEN HEART N/A  07/11/2017   Procedure: EXPLORATION POST OPERATIVE OPEN HEART;  Surgeon: Ivin Poot, MD;  Location: [place];  Service: Open Heart Surgery;  Laterality: N/A;  . EYE SURGERY     also had left cataract removed   . FRACTURE SURGERY    . HEMATOMA EVACUATION N/A 07/11/2017   Procedure: EVACUATION HEMATOMA;  Surgeon: Ivin Poot, MD;  Location: [place];  Service: Open Heart Surgery;  Laterality: N/A;  . HERNIA REPAIR    . JOINT REPLACEMENT    . MICROLARYNGOSCOPY WITH LASER AND BALLOON DILATION N/A 01/24/2018   Procedure: MICROLARYNGOSCOPY WITH LASER AND BALLOON DILATION;  Surgeon: Melida Quitter, MD;  Location: [place];  Service: ENT;  Laterality: N/A;  . MICROLARYNGOSCOPY WITH LASER AND BALLOON DILATION N/A 04/13/2018   Procedure:  MICROLARYNGOSCOPY WITH BALLOON DILATION;  Surgeon: Melida Quitter, MD;  Location: [facility];  Service: ENT;  Laterality: N/A;  . NEPHROLITHOTOMY Left 04/07/2017   Procedure: NEPHROLITHOTOMY PERCUTANEOUS WITH SURGEON ACCESS;  Surgeon: Ardis Hughs, MD;  [facility];  Service: Urology;  Laterality: Left;  . ORIF HUMERUS FRACTURE  05/30/2011   Procedure: OPEN REDUCTION INTERNAL FIXATION (ORIF) PROXIMAL HUMERUS FRACTURE;  Surgeon: Augustin Schooling;  Location: [place];  Service: Orthopedics;  Laterality: Left;  open reduction internal fixation of proximal humerus fracture  . PLACEMENT OF CENTRIMAG VENTRICULAR ASSIST DEVICE Right 07/11/2017   Procedure: PLACEMENT OF CENTRIMAG VENTRICULAR ASSIST DEVICE;  Surgeon: Ivin Poot, MD;  Location: [place];  Service: Open Heart Surgery;  Laterality: Right;  . REMOVAL OF CENTRIMAG VENTRICULAR ASSIST DEVICE N/A 07/17/2017   Procedure: REMOVAL OF RVAD WITH PUMP STANDBY;  Surgeon: Ivin Poot, MD;  Location: [place];  Service: Open Heart Surgery;  Laterality: N/A;  . REPLACEMENT ASCENDING AORTA N/A 07/10/2017   Procedure: REPLACEMENT ASCENDING AORTA;  Surgeon: Ivin Poot, MD;  Location: [place];  Service: Open Heart Surgery;  Laterality: N/A;  . RIGHT/LEFT HEART CATH AND CORONARY ANGIOGRAPHY N/A 06/13/2017   Procedure: RIGHT/LEFT HEART CATH AND CORONARY ANGIOGRAPHY;  Surgeon: Jolaine Artist, MD;  Location: [place] [facility];  Service: Cardiovascular;  Laterality: N/A;  . TEE WITHOUT CARDIOVERSION N/A 07/10/2017   Procedure: TRANSESOPHAGEAL ECHOCARDIOGRAM (TEE);  Surgeon: [name], Collier Salina, MD;  Location: [place];  Service: Open Heart Surgery;  Laterality: N/A;  . TEE WITHOUT CARDIOVERSION  07/11/2017   Procedure: TRANSESOPHAGEAL ECHOCARDIOGRAM (TEE);  Surgeon: [name], Collier Salina, MD;  Location: [place];  Service: Open Heart Surgery;;  . TEE WITHOUT CARDIOVERSION N/A 07/17/2017   Procedure: TRANSESOPHAGEAL ECHOCARDIOGRAM (TEE);  Surgeon: [name], Collier Salina, MD;   Location: [place];  Service: Open Heart Surgery;  Laterality: N/A;  . THYROID LOBECTOMY Left 07/27/2015   Procedure: LEFT THYROID LOBECTOMY;  Surgeon: Fanny Skates, MD;  [facility];  Service: General;  Laterality: Left;  . THYROIDECTOMY N/A 08/06/2015   Procedure: RIGHT THYROID LOBECTOMY, REIMPLANTATION PARATHYROID;  Surgeon: Fanny Skates, MD;  [facility];  Service: General;  Laterality: N/A;  . TOTAL KNEE ARTHROPLASTY Right 08/2009   Dr. Gladstone Pih  . TRACHEOSTOMY  08/08/2017  . VENTRAL HERNIA REPAIR  2/99    Family History  Problem Relation Age of Onset  . Cancer Mother        PANCREATIC   . Osteoporosis Mother   . Hip fracture Mother   . Heart attack Father 63       Died 68  . Heart disease Father   . Arthritis Father   . Hypertension  Sister   . Cancer Sister        skin  . Hypertension Sister   . Cancer Sister        breast cancer  . Breast cancer Neg Hx     Social History Social History   Tobacco Use  . Smoking status: Never Smoker  . Smokeless tobacco: Never Used  Vaping Use  . Vaping Use: Never used  Substance Use Topics  . Alcohol use: No  . Drug use: Never    Current Outpatient Medications  Medication Sig Dispense Refill  . aspirin 81 MG chewable tablet Chew 81 mg by mouth at bedtime.     . benzonatate (TESSALON) 200 MG capsule Take 1 capsule (200 mg total) by mouth 3 (three) times daily as needed. 30 capsule 1  . calcium carbonate (OS-CAL - DOSED IN MG OF ELEMENTAL CALCIUM) 1250 (500 Ca) MG tablet Take 1 tablet (500 mg of elemental calcium total) by mouth daily with breakfast. 30 tablet 0  . clonazePAM (KLONOPIN) 0.25 MG disintegrating tablet DISSOLVE 1 TABLET BY MOUTH 3 TIMES A DAY 90 tablet 2  . DULoxetine (CYMBALTA) 30 MG capsule Take 1 capsule (30 mg total) by mouth daily. 30 capsule 2  . furosemide (LASIX) 20 MG tablet Take 10 mg by mouth daily. 10 mg po  Mon/Wed/Fridays    . hydrocortisone (ANUSOL-HC) 2.5 % rectal cream APPLY RECTALLY 2  TIMES A DAY AS NEEDED 30 g 0  . ketoconazole (NIZORAL) 2 % cream Apply topically daily. 15 g 0  . latanoprost (XALATAN) 0.005 % ophthalmic solution Place 1 drop into both eyes at bedtime. 2.5 mL 12  . levothyroxine (SYNTHROID) 88 MCG tablet Take 100 mcg by mouth daily before breakfast.     . linaclotide (LINZESS) 72 MCG capsule Take 1 capsule (72 mcg total) by mouth daily before breakfast. 30 capsule 5  . loratadine (CLARITIN) 10 MG tablet Take 1 tablet (10 mg total) by mouth daily. 30 tablet 9  . magnesium oxide (MAG-OX) 400 (241.3 Mg) MG tablet Take 1 tablet (400 mg total) by mouth daily. (Patient taking differently: Take 400 mg by mouth 2 (two) times daily. ) 30 tablet 0  . Multiple Vitamin (MULTIVITAMIN WITH MINERALS) TABS tablet Take 1 tablet by mouth daily.    . potassium chloride (KLOR-CON) 10 MEQ tablet TAKE 2 TABLETS TWICE A DAY 120 tablet 2  . predniSONE (DELTASONE) 20 MG tablet 2 po at same time daily for 5 days 10 tablet 0  . QUEtiapine (SEROQUEL) 25 MG tablet TAKE 3 TABLETS AT BEDTIME AS DIRECTED 90 tablet 0  . simvastatin (ZOCOR) 80 MG tablet TAKE 1 TABLET BY MOUTH DAILY.. 90 tablet 1  . azithromycin (ZITHROMAX) 250 MG tablet Take 2 tablets the first day then 1 tablet every day until completed 6 each 0   Current Facility-Administered Medications  Medication Dose Route Frequency Provider Last Rate Last Admin  . cyanocobalamin ((VITAMIN B-12)) injection 1,000 mcg  1,000 mcg Intramuscular Q30 days [name], [name], MD   1,000 mcg at 03/09/20 1522    Allergies  Allergen Reactions  . Lipitor [Atorvastatin Calcium] Other (See Comments)    myalgias  . Lyrica [Pregabalin] Other (See Comments)    SORES IN MOUTH   . Sibutramine Hcl Monohydrate Other (See Comments)    UNSPECIFIED REACTION   . Alendronate Sodium Nausea Only  . Latex Rash and Other (See Comments)    RA to latex 1982; includes bandaids    . Levofloxacin Nausea  And Vomiting  . Meloxicam Other (See Comments)     Vaginal itching     Review of Systems  Stable He has been vaccinated x3 for the COVID-19 virus  BP 107/68 (BP Location: Left Arm, Patient Position: Sitting)   Pulse 82   Resp 20   Ht 5\' 5"  (1.651 m)   Wt 187 lb (84.8 kg)   SpO2 96% Comment: RA with mask on  BMI 31.12 kg/m  Physical Exam      Exam    General- alert and comfortable with PM valve over trach    Neck- no JVD, no cervical adenopathy palpable, no carotid bruit   Lungs- clear without rales, wheezes   Cor- regular rate and rhythm, 2/6 systolic flow murmur , no gallop   Abdomen- soft, non-tender   Extremities - warm, non-tender, minimal edema   Neuro- oriented, appropriate, no focal weakness   Diagnostic Tests:  Chest x-ray image performed today is personally reviewed which is clear. Impression: Doing well 3 years after resection and grafting of a 5.4 cm ascending aneurysm and AVR with a Edwards bioprosthesis.  Minimal cardiac symptoms.  Patient will be permanently dependent on tracheostomy because of upper airway scarring and vocal cord dysfunction.  She is regularly followed by her cardiologist, her ENT surgeon and primary care physician.  She will return here as needed.  Plan: Continue current medications.  I have called in a Z-Pak because of recent onset of some greenish airway secretions.  She will return here as needed.   Len Childs, MD [facility] [phone]

## 2020-06-01 ENCOUNTER — Other Ambulatory Visit: Payer: Self-pay | Admitting: Family Medicine

## 2020-06-12 ENCOUNTER — Other Ambulatory Visit: Payer: Self-pay

## 2020-06-12 ENCOUNTER — Encounter: Payer: Self-pay | Admitting: Family Medicine

## 2020-06-12 ENCOUNTER — Ambulatory Visit: Payer: Medicare Other | Admitting: Family Medicine

## 2020-06-12 VITALS — Ht 65.0 in

## 2020-06-12 DIAGNOSIS — E538 Deficiency of other specified B group vitamins: Secondary | ICD-10-CM

## 2020-06-12 DIAGNOSIS — F339 Major depressive disorder, recurrent, unspecified: Secondary | ICD-10-CM

## 2020-06-12 NOTE — Progress Notes (Signed)
Ht 5\' 5"  (1.651 m)   BMI 31.12 kg/m    Subjective:   Patient ID: [name], female    DOB: July 17, 1940, 79 y.o.   MRN: [id number]  HPI: [name] is a 79 y.o. female presenting on 06/12/2020 for Anxiety (4 week follow up/)   HPI Patient is coming in for recheck for depression anxiety.  Patient feels like she is doing very well on the Cymbalta and want to continue forward with the current dose of medication.  She denies any suicidal ideations or thoughts of hurting her self.  Patient denies any major side effects.  She feels like her anxiety is under much better control.  Relevant past medical, surgical, family and social history reviewed and updated as indicated. Interim medical history since our last visit reviewed. Allergies and medications reviewed and updated.  Review of Systems  Constitutional: Negative for chills and fever.  Eyes: Negative for visual disturbance.  Respiratory: Negative for chest tightness and shortness of breath.   Cardiovascular: Negative for chest pain and leg swelling.  Musculoskeletal: Negative for back pain and gait problem.  Skin: Negative for rash.  Neurological: Negative for light-headedness and headaches.  Psychiatric/Behavioral: Negative for agitation, behavioral problems, dysphoric mood, self-injury, sleep disturbance and suicidal ideas. The patient is nervous/anxious.   All other systems reviewed and are negative.   Per HPI unless specifically indicated above   Allergies as of 06/12/2020      Reactions   Lipitor [atorvastatin Calcium] Other (See Comments)   myalgias   Lyrica [pregabalin] Other (See Comments)   SORES IN MOUTH    Sibutramine Hcl Monohydrate Other (See Comments)   UNSPECIFIED REACTION    Alendronate Sodium Nausea Only   Latex Rash, Other (See Comments)   RA to latex 1982; includes bandaids    Levofloxacin Nausea And Vomiting   Meloxicam Other (See Comments)   Vaginal itching       Medication List        Accurate as of June 12, 2020 12:00 PM. If you have any questions, ask your nurse or doctor.        STOP taking these medications   azithromycin 250 MG tablet Commonly known as: ZITHROMAX Stopped by: [name] Leland Raver, MD   predniSONE 20 MG tablet Commonly known as: DELTASONE Stopped by: [name] Jatziry Wechter, MD     TAKE these medications   aspirin 81 MG chewable tablet Chew 81 mg by mouth at bedtime.   benzonatate 200 MG capsule Commonly known as: TESSALON Take 1 capsule (200 mg total) by mouth 3 (three) times daily as needed.   calcium carbonate 1250 (500 Ca) MG tablet Commonly known as: OS-CAL - dosed in mg of elemental calcium Take 1 tablet (500 mg of elemental calcium total) by mouth daily with breakfast.   clonazePAM 0.25 MG disintegrating tablet Commonly known as: KLONOPIN DISSOLVE 1 TABLET BY MOUTH 3 TIMES A DAY   DULoxetine 30 MG capsule Commonly known as: Cymbalta Take 1 capsule (30 mg total) by mouth daily.   furosemide 20 MG tablet Commonly known as: LASIX Take 10 mg by mouth daily. 10 mg po  Mon/Wed/Fridays   guaiFENesin 600 MG 12 hr tablet Commonly known as: Mucinex Take 1 tablet (600 mg total) by mouth 2 (two) times daily as needed.   hydrocortisone 2.5 % rectal cream Commonly known as: ANUSOL-HC APPLY RECTALLY 2 TIMES A DAY AS NEEDED   ketoconazole 2 % cream Commonly known as: NIZORAL Apply topically daily.  latanoprost 0.005 % ophthalmic solution Commonly known as: XALATAN Place 1 drop into both eyes at bedtime.   levothyroxine 88 MCG tablet Commonly known as: SYNTHROID Take 100 mcg by mouth daily before breakfast.   linaclotide 72 MCG capsule Commonly known as: Linzess Take 1 capsule (72 mcg total) by mouth daily before breakfast.   loratadine 10 MG tablet Commonly known as: CLARITIN Take 1 tablet (10 mg total) by mouth daily.   magnesium oxide 400 (241.3 Mg) MG tablet Commonly known as: MAG-OX Take 1 tablet (400 mg total) by  mouth daily. What changed: when to take this   multivitamin with minerals Tabs tablet Take 1 tablet by mouth daily.   potassium chloride 10 MEQ tablet Commonly known as: KLOR-CON TAKE 2 TABLETS TWICE A DAY   QUEtiapine 25 MG tablet Commonly known as: SEROQUEL TAKE 3 TABLETS AT BEDTIME AS DIRECTED   simvastatin 80 MG tablet Commonly known as: ZOCOR TAKE 1 TABLET BY MOUTH DAILY.Marland Kitchen        Objective:   Ht 5\' 5"  (1.651 m)   BMI 31.12 kg/m   Wt Readings from Last 3 Encounters:  05/27/20 187 lb (84.8 kg)  05/15/20 187 lb (84.8 kg)  04/10/20 189 lb (85.7 kg)    Physical Exam Vitals and nursing note reviewed.  Constitutional:      General: She is not in acute distress.    Appearance: She is well-developed and well-nourished. She is not diaphoretic.  Eyes:     Extraocular Movements: EOM normal.     Conjunctiva/sclera: Conjunctivae normal.  Cardiovascular:     Rate and Rhythm: Normal rate and regular rhythm.     Pulses: Intact distal pulses.     Heart sounds: Normal heart sounds. No murmur heard.   Pulmonary:     Effort: Pulmonary effort is normal. No respiratory distress.     Breath sounds: Normal breath sounds. No wheezing.  Musculoskeletal:        General: No tenderness or edema. Normal range of motion.  Skin:    General: Skin is warm and dry.     Findings: No rash.  Neurological:     Mental Status: She is alert and oriented to person, place, and time.     Coordination: Coordination normal.  Psychiatric:        Mood and Affect: Mood is anxious. Mood is not depressed.        Behavior: Behavior normal.        Thought Content: Thought content does not include suicidal ideation. Thought content does not include suicidal plan.       Assessment & Plan:   Problem List Items Addressed This Visit      Other   Depression, recurrent ([place]) - Primary   Relevant Medications   DULoxetine (CYMBALTA) 60 MG capsule       Follow up plan: Return if symptoms worsen or  fail to improve.  Counseling provided for all of the vaccine components No orders of the defined types were placed in this encounter.   Terri Pina, MD [facility] 06/12/2020, 12:00 PM

## 2020-06-18 ENCOUNTER — Telehealth: Payer: Medicare Other

## 2020-06-24 MED ORDER — DULOXETINE HCL 60 MG PO CPEP: 60.0000 mg | ORAL_CAPSULE | Freq: Every day | ORAL | 3 refills | Status: DC

## 2020-06-30 ENCOUNTER — Other Ambulatory Visit: Payer: Self-pay | Admitting: Family Medicine

## 2020-06-30 DIAGNOSIS — J386 Stenosis of larynx: Secondary | ICD-10-CM | POA: Diagnosis not present

## 2020-06-30 DIAGNOSIS — Z930 Tracheostomy status: Secondary | ICD-10-CM | POA: Diagnosis not present

## 2020-07-15 ENCOUNTER — Ambulatory Visit: Payer: Medicare Other | Admitting: Cardiology

## 2020-07-20 ENCOUNTER — Other Ambulatory Visit: Payer: Self-pay | Admitting: Family Medicine

## 2020-07-21 NOTE — Progress Notes (Signed)
Cardiology Office Note   Date:  07/23/2020   ID:  [name], DOB Mar 31, 1941, MRN [id number]  PCP:  [name], [name], MD  Cardiologist:   No primary care provider on file.   Chief Complaint  Patient presents with  . Leg Pain      History of Present Illness: [name] is a 80 y.o. female who presents for follow up after AVR and aortic surgery.  She was admitted in Jan 2019 for elective AVR and ascending root replacement.  Post op she had tamponade and VF arrest.  She required an RVAD.    She had a tracheostomy placed and did go to rehab.   Follow up echo demonstrated a normal EF.   There was moderate TR.  The AVR looked OK.   Since I last saw her in July she was in the hospital in October for treatment of laryngeal stenosis.  Unfortunately since I last saw her she has been told that she has too much scar tissue and they will not be able to decannulate her.    Since I last saw her she has had no new cardiovascular complaints other than some chronic venous stasis ulcers and mild discomfort on her left leg.  She is not feeling any palpitations, presyncope or syncope.  He has had no new shortness of breath, PND or orthopnea.  He had no weight gain or edema.  She tries to be active.  Past Medical History:  Diagnosis Date  . ACL tear    right  Dr. Gladstone Pih   . Adenomatous polyp   . Anxiety   . Arthritis    "all over" (04/12/2018)  . Ascending aortic aneurysm ([place])    note per chart per Dr Lucianne Lei Tright 4.7 cm 04/15/2015   . Asthma   . B12 deficiency   . Chronic bronchitis ([place])   . Depression   . DUB (dysfunctional uterine bleeding) 10/96  . Fibromyalgia   . GERD (gastroesophageal reflux disease)   . Glaucoma    bilaterally  . Helicobacter pylori (H. pylori)   . Hiatal hernia   . History of blood transfusion 06/2017   "related to OHS"  . History of kidney stones   . History of left shoulder fracture    pt states fell off bed and broke ball in shoulder had rod placed    . Hyperlipidemia   . Hypertension   . Hypothyroidism   . Inspiratory stridor   . Laryngeal stenosis   . Occasional tremors    left arm   . Osteoarthritis   . Osteopenia   . Osteoporosis   . Thyroid cancer ([place]) 2017  . Thyroid nodule   . Tracheostomy in place [facility])     Past Surgical History:  Procedure Laterality Date  . AORTIC VALVE REPLACEMENT N/A 07/10/2017   Procedure: AORTIC VALVE REPLACEMENT (AVR);  Surgeon: [name], Collier Salina, MD;  Location: [place];  Service: Open Heart Surgery;  Laterality: N/A;  . APPENDECTOMY    . BUNIONECTOMY  08/1999   right - Dr. Irving Shows   . CARDIAC VALVE REPLACEMENT    . CATARACT EXTRACTION, BILATERAL Bilateral 05/28/07-2009   "right-left"  . CHOLECYSTECTOMY OPEN  1979  . DILATION AND CURETTAGE OF UTERUS  03/31/95   Dr. Ovid Curd   . DIRECT LARYNGOSCOPY WITH BOTOX INJECTION N/A 12/01/2017   Procedure: SUSPENDED DIRECT MICROLARYNGOSCOPY WITH KENALOG INJECTION, CO2 LASER;  Surgeon: Melida Quitter, MD;  Location: [place];  Service: ENT;  Laterality:  N/A;  . EXCISIONAL HEMORRHOIDECTOMY    . EXPLORATION POST OPERATIVE OPEN HEART N/A 07/11/2017   Procedure: EXPLORATION POST OPERATIVE OPEN HEART;  Surgeon: Ivin Poot, MD;  Location: [place];  Service: Open Heart Surgery;  Laterality: N/A;  . EYE SURGERY     also had left cataract removed   . FRACTURE SURGERY    . HEMATOMA EVACUATION N/A 07/11/2017   Procedure: EVACUATION HEMATOMA;  Surgeon: Ivin Poot, MD;  Location: [place];  Service: Open Heart Surgery;  Laterality: N/A;  . HERNIA REPAIR    . JOINT REPLACEMENT    . MICROLARYNGOSCOPY WITH LASER AND BALLOON DILATION N/A 01/24/2018   Procedure: MICROLARYNGOSCOPY WITH LASER AND BALLOON DILATION;  Surgeon: Melida Quitter, MD;  Location: [place];  Service: ENT;  Laterality: N/A;  . MICROLARYNGOSCOPY WITH LASER AND BALLOON DILATION N/A 04/13/2018   Procedure: MICROLARYNGOSCOPY WITH BALLOON DILATION;  Surgeon: Melida Quitter, MD;  Location: [facility];  Service: ENT;   Laterality: N/A;  . NEPHROLITHOTOMY Left 04/07/2017   Procedure: NEPHROLITHOTOMY PERCUTANEOUS WITH SURGEON ACCESS;  Surgeon: Ardis Hughs, MD;  [facility];  Service: Urology;  Laterality: Left;  . ORIF HUMERUS FRACTURE  05/30/2011   Procedure: OPEN REDUCTION INTERNAL FIXATION (ORIF) PROXIMAL HUMERUS FRACTURE;  Surgeon: Augustin Schooling;  Location: [place];  Service: Orthopedics;  Laterality: Left;  open reduction internal fixation of proximal humerus fracture  . PLACEMENT OF CENTRIMAG VENTRICULAR ASSIST DEVICE Right 07/11/2017   Procedure: PLACEMENT OF CENTRIMAG VENTRICULAR ASSIST DEVICE;  Surgeon: Ivin Poot, MD;  Location: [place];  Service: Open Heart Surgery;  Laterality: Right;  . REMOVAL OF CENTRIMAG VENTRICULAR ASSIST DEVICE N/A 07/17/2017   Procedure: REMOVAL OF RVAD WITH PUMP STANDBY;  Surgeon: Ivin Poot, MD;  Location: [place];  Service: Open Heart Surgery;  Laterality: N/A;  . REPLACEMENT ASCENDING AORTA N/A 07/10/2017   Procedure: REPLACEMENT ASCENDING AORTA;  Surgeon: Ivin Poot, MD;  Location: [place];  Service: Open Heart Surgery;  Laterality: N/A;  . RIGHT/LEFT HEART CATH AND CORONARY ANGIOGRAPHY N/A 06/13/2017   Procedure: RIGHT/LEFT HEART CATH AND CORONARY ANGIOGRAPHY;  Surgeon: Jolaine Artist, MD;  Location: [place] [facility];  Service: Cardiovascular;  Laterality: N/A;  . TEE WITHOUT CARDIOVERSION N/A 07/10/2017   Procedure: TRANSESOPHAGEAL ECHOCARDIOGRAM (TEE);  Surgeon: [name], Collier Salina, MD;  Location: [place];  Service: Open Heart Surgery;  Laterality: N/A;  . TEE WITHOUT CARDIOVERSION  07/11/2017   Procedure: TRANSESOPHAGEAL ECHOCARDIOGRAM (TEE);  Surgeon: [name], Collier Salina, MD;  Location: [place];  Service: Open Heart Surgery;;  . TEE WITHOUT CARDIOVERSION N/A 07/17/2017   Procedure: TRANSESOPHAGEAL ECHOCARDIOGRAM (TEE);  Surgeon: [name], Collier Salina, MD;  Location: [place];  Service: Open Heart Surgery;  Laterality: N/A;  . THYROID LOBECTOMY Left 07/27/2015    Procedure: LEFT THYROID LOBECTOMY;  Surgeon: Fanny Skates, MD;  [facility];  Service: General;  Laterality: Left;  . THYROIDECTOMY N/A 08/06/2015   Procedure: RIGHT THYROID LOBECTOMY, REIMPLANTATION PARATHYROID;  Surgeon: Fanny Skates, MD;  [facility];  Service: General;  Laterality: N/A;  . TOTAL KNEE ARTHROPLASTY Right 08/2009   Dr. Gladstone Pih  . TRACHEOSTOMY  08/08/2017  . VENTRAL HERNIA REPAIR  2/99     Current Outpatient Medications  Medication Sig Dispense Refill  . aspirin 81 MG chewable tablet Chew 81 mg by mouth at bedtime.     . benzonatate (TESSALON) 200 MG capsule Take 1 capsule (200 mg total) by mouth 3 (three) times daily  as needed. 30 capsule 1  . calcium carbonate (OS-CAL - DOSED IN MG OF ELEMENTAL CALCIUM) 1250 (500 Ca) MG tablet Take 1 tablet (500 mg of elemental calcium total) by mouth daily with breakfast. 30 tablet 0  . clonazePAM (KLONOPIN) 0.25 MG disintegrating tablet DISSOLVE 1 TABLET BY MOUTH 3 TIMES A DAY 90 tablet 2  . DULoxetine (CYMBALTA) 30 MG capsule Take 30 mg by mouth daily.    . furosemide (LASIX) 20 MG tablet Take 10 mg by mouth daily. 10 mg po  Mon/Wed/Fridays    . hydrocortisone (ANUSOL-HC) 2.5 % rectal cream APPLY RECTALLY 2 TIMES A DAY AS NEEDED 30 g 0  . ketoconazole (NIZORAL) 2 % cream Apply topically daily. 15 g 0  . latanoprost (XALATAN) 0.005 % ophthalmic solution Place 1 drop into both eyes at bedtime. 2.5 mL 12  . levothyroxine (SYNTHROID) 88 MCG tablet Take 100 mcg by mouth daily before breakfast.     . linaclotide (LINZESS) 72 MCG capsule Take 1 capsule (72 mcg total) by mouth daily before breakfast. 30 capsule 5  . loratadine (CLARITIN) 10 MG tablet Take 1 tablet (10 mg total) by mouth daily. 30 tablet 9  . magnesium oxide (MAG-OX) 400 (241.3 Mg) MG tablet Take 1 tablet (400 mg total) by mouth daily. (Patient taking differently: Take 400 mg by mouth 2 (two) times daily.) 30 tablet 0  . Multiple Vitamin (MULTIVITAMIN WITH MINERALS)  TABS tablet Take 1 tablet by mouth daily.    . potassium chloride (KLOR-CON) 10 MEQ tablet TAKE 2 TABLETS TWICE A DAY 120 tablet 2  . QUEtiapine (SEROQUEL) 25 MG tablet TAKE 3 TABLETS AT BEDTIME AS DIRECTED 90 tablet 0  . simvastatin (ZOCOR) 80 MG tablet TAKE 1 TABLET BY MOUTH DAILY.. 90 tablet 1  . venlafaxine (EFFEXOR) 25 MG tablet Take 25 mg by mouth 2 (two) times daily.     Current Facility-Administered Medications  Medication Dose Route Frequency Provider Last Rate Last Admin  . cyanocobalamin ((VITAMIN B-12)) injection 1,000 mcg  1,000 mcg Intramuscular Q30 days [name], [name], MD   1,000 mcg at 07/22/20 1504    Allergies:   Lipitor [atorvastatin calcium], Lyrica [pregabalin], Sibutramine hcl monohydrate, Alendronate sodium, Latex, Levofloxacin, and Meloxicam    ROS:  Please see the history of present illness.   Otherwise, review of systems are positive for none.   All other systems are reviewed and negative.    PHYSICAL EXAM: VS:  BP 140/80   Pulse 88   Ht 5\' 6"  (1.676 m)   Wt 189 lb (85.7 kg)   BMI 30.51 kg/m  , BMI Body mass index is 30.51 kg/m.  GENERAL:  Well appearing NECK:  No jugular venous distention, waveform within normal limits, carotid upstroke brisk and symmetric, no bruits, no thyromegaly LUNGS:  Clear to auscultation bilaterally CHEST:  Unremarkable HEART:  PMI not displaced or sustained,S1 and S2 within normal limits, no S3, no S4, no clicks, no rubs, 2 out of 6 apical systolic murmur radiating slightly out aortic outflow tract, no diastolic murmurs ABD:  Flat, positive bowel sounds normal in frequency in pitch, no bruits, no rebound, no guarding, no midline pulsatile mass, no hepatomegaly, no splenomegaly EXT:  2 plus pulses throughout, mild left leg edema, no cyanosis no clubbing, chronic venous stasis changes   EKG:  EKG is  ordered today. Sinus rhythm, rate 88, right bundle branch block, left anterior fascicular block, no acute ST-T wave  changes.  Recent Labs: 04/10/2020: ALT 11;  BUN 15; Creatinine, Ser 0.94; Hemoglobin 12.2; Platelets 212; Potassium 4.1; Sodium 143 05/15/2020: TSH 2.630    Lipid Panel    Component Value Date/Time   CHOL 162 04/10/2020 1305   CHOL 166 10/25/2012 0826   TRIG 84 04/10/2020 1305   TRIG 92 05/03/2016 1437   TRIG 82 10/25/2012 0826   HDL 76 04/10/2020 1305   HDL 58 05/03/2016 1437   HDL 69 10/25/2012 0826   CHOLHDL 2.1 04/10/2020 1305   LDLCALC 70 04/10/2020 1305   LDLCALC 91 03/14/2014 1139   LDLCALC 81 10/25/2012 0826      Wt Readings from Last 3 Encounters:  07/22/20 189 lb (85.7 kg)  05/27/20 187 lb (84.8 kg)  05/15/20 187 lb (84.8 kg)      Other studies Reviewed: Additional studies/ records that were reviewed today include:   None Review of the above records demonstrates:  NA   ASSESSMENT AND PLAN   AVR/AORTIC ROOT REPLACEMENT:    Echo in April 2021 demonstrated a stable AVR.  No change in therapy.  No further imaging.  RV FAILURE: This was resolved on echo.   HTN: Blood pressures well controlled.  She will continue the meds as listed.   LEG PAIN:  I will check ABIs.  I suspect this is related to venous issues rather than arterial.     Current medicines are reviewed at length with the patient today.  The patient does not have concerns regarding medicines.  The following changes have been made:  None   Labs/ tests ordered today include: None  Orders Placed This Encounter  Procedures  . US ARTERIAL LOWER EXTREMITY DUPLEX BILATERAL  . EKG 12-Lead     Disposition:   FU with me in 6 months.     Signed, Minus Breeding, MD  07/23/2020 12:55 PM   [facility]

## 2020-07-22 ENCOUNTER — Encounter: Payer: Self-pay | Admitting: Cardiology

## 2020-07-22 ENCOUNTER — Other Ambulatory Visit: Payer: Self-pay

## 2020-07-22 ENCOUNTER — Ambulatory Visit: Payer: Medicare Other | Admitting: Cardiology

## 2020-07-22 ENCOUNTER — Ambulatory Visit (INDEPENDENT_AMBULATORY_CARE_PROVIDER_SITE_OTHER): Payer: Medicare Other | Admitting: *Deleted

## 2020-07-22 VITALS — BP 140/80 | HR 88 | Ht 66.0 in | Wt 189.0 lb

## 2020-07-22 DIAGNOSIS — I10 Essential (primary) hypertension: Secondary | ICD-10-CM | POA: Diagnosis not present

## 2020-07-22 DIAGNOSIS — I50810 Right heart failure, unspecified: Secondary | ICD-10-CM | POA: Diagnosis not present

## 2020-07-22 DIAGNOSIS — M79606 Pain in leg, unspecified: Secondary | ICD-10-CM

## 2020-07-22 DIAGNOSIS — E538 Deficiency of other specified B group vitamins: Secondary | ICD-10-CM

## 2020-07-22 DIAGNOSIS — Z952 Presence of prosthetic heart valve: Secondary | ICD-10-CM | POA: Diagnosis not present

## 2020-07-22 NOTE — Patient Instructions (Signed)
Medication Instructions:  The current medical regimen is effective;  continue present plan and medications.  *If you need a refill on your cardiac medications before your next appointment, please call your pharmacy*  Testing/Procedures: Your physician has requested that you have a lower extremity arterial exercise duplex. During this test, exercise and ultrasound are used to evaluate arterial blood flow in the legs. Allow one hour for this exam. There are no restrictions or special instructions. You will be contacted to be scheduled for this testing which will be completed at [place]: At [facility], you and your health needs are our priority.  As part of our continuing mission to provide you with exceptional heart care, we have created designated Provider Care Teams.  These Care Teams include your primary Cardiologist (physician) and Advanced Practice Providers (APPs -  Physician Assistants and Nurse Practitioners) who all work together to provide you with the care you need, when you need it.  We recommend signing up for the patient portal called "MyChart".  Sign up information is provided on this After Visit Summary.  MyChart is used to connect with patients for Virtual Visits (Telemedicine).  Patients are able to view lab/test results, encounter notes, upcoming appointments, etc.  Non-urgent messages can be sent to your provider as well.   To learn more about what you can do with MyChart, go to [email or web address].    Your next appointment:   6 month(s)  The format for your next appointment:   In Person  Provider:   Minus Breeding, MD   Thank you for choosing [facility]!!

## 2020-07-22 NOTE — Progress Notes (Signed)
Pt tolerated B12 well 

## 2020-07-24 ENCOUNTER — Telehealth: Payer: Medicare Other

## 2020-07-24 ENCOUNTER — Telehealth: Payer: Self-pay | Admitting: Cardiology

## 2020-07-24 NOTE — Telephone Encounter (Signed)
Pre-cert Verification for the following procedure    [place] LOWER EXT ART-60   DR [facility]   DATE: 07/30/2020  LOCATION: [facility]

## 2020-07-27 DIAGNOSIS — I872 Venous insufficiency (chronic) (peripheral): Secondary | ICD-10-CM | POA: Diagnosis not present

## 2020-07-27 DIAGNOSIS — L570 Actinic keratosis: Secondary | ICD-10-CM | POA: Diagnosis not present

## 2020-07-27 DIAGNOSIS — X32XXXD Exposure to sunlight, subsequent encounter: Secondary | ICD-10-CM | POA: Diagnosis not present

## 2020-07-30 ENCOUNTER — Other Ambulatory Visit: Payer: Self-pay | Admitting: Family Medicine

## 2020-07-30 ENCOUNTER — Ambulatory Visit (HOSPITAL_COMMUNITY)
Admission: RE | Admit: 2020-07-30 | Discharge: 2020-07-30 | Disposition: A | Discharge status: Home / Self Care | Payer: Medicare Other | Source: Ambulatory Visit | Attending: Cardiology | Admitting: Cardiology

## 2020-07-30 ENCOUNTER — Telehealth: Payer: Medicare Other

## 2020-07-30 ENCOUNTER — Other Ambulatory Visit: Payer: Self-pay

## 2020-07-30 DIAGNOSIS — M79606 Pain in leg, unspecified: Secondary | ICD-10-CM

## 2020-07-30 DIAGNOSIS — M79605 Pain in left leg: Secondary | ICD-10-CM | POA: Diagnosis not present

## 2020-07-30 DIAGNOSIS — M79604 Pain in right leg: Secondary | ICD-10-CM | POA: Diagnosis not present

## 2020-08-06 ENCOUNTER — Other Ambulatory Visit: Payer: Self-pay | Admitting: Family Medicine

## 2020-08-17 ENCOUNTER — Other Ambulatory Visit: Payer: Self-pay | Admitting: Family Medicine

## 2020-08-24 ENCOUNTER — Ambulatory Visit (INDEPENDENT_AMBULATORY_CARE_PROVIDER_SITE_OTHER): Payer: Medicare Other | Admitting: *Deleted

## 2020-08-24 ENCOUNTER — Other Ambulatory Visit: Payer: Self-pay

## 2020-08-24 DIAGNOSIS — E538 Deficiency of other specified B group vitamins: Secondary | ICD-10-CM | POA: Diagnosis not present

## 2020-09-01 ENCOUNTER — Telehealth: Payer: Medicare Other

## 2020-09-01 ENCOUNTER — Other Ambulatory Visit: Payer: Self-pay | Admitting: Family Medicine

## 2020-09-07 ENCOUNTER — Other Ambulatory Visit: Payer: Self-pay | Admitting: Family Medicine

## 2020-09-10 ENCOUNTER — Encounter: Payer: Self-pay | Admitting: Family Medicine

## 2020-09-10 ENCOUNTER — Other Ambulatory Visit: Payer: Self-pay

## 2020-09-10 ENCOUNTER — Ambulatory Visit (INDEPENDENT_AMBULATORY_CARE_PROVIDER_SITE_OTHER): Payer: Medicare Other | Admitting: Family Medicine

## 2020-09-10 VITALS — BP 119/65 | HR 80 | Ht 66.0 in | Wt 187.0 lb

## 2020-09-10 DIAGNOSIS — E039 Hypothyroidism, unspecified: Secondary | ICD-10-CM

## 2020-09-10 DIAGNOSIS — F339 Major depressive disorder, recurrent, unspecified: Secondary | ICD-10-CM | POA: Diagnosis not present

## 2020-09-10 DIAGNOSIS — E7800 Pure hypercholesterolemia, unspecified: Secondary | ICD-10-CM | POA: Diagnosis not present

## 2020-09-10 DIAGNOSIS — C73 Malignant neoplasm of thyroid gland: Secondary | ICD-10-CM

## 2020-09-10 DIAGNOSIS — F411 Generalized anxiety disorder: Secondary | ICD-10-CM

## 2020-09-10 DIAGNOSIS — Z430 Encounter for attention to tracheostomy: Secondary | ICD-10-CM

## 2020-09-10 MED ORDER — CLONAZEPAM 0.25 MG PO TBDP: ORAL_TABLET | ORAL | 2 refills | Status: DC

## 2020-09-10 MED ORDER — SIMVASTATIN 80 MG PO TABS: 80.0000 mg | ORAL_TABLET | Freq: Every day | ORAL | 3 refills | Status: DC

## 2020-09-10 MED ORDER — VENLAFAXINE HCL 75 MG PO TABS: 75.0000 mg | ORAL_TABLET | Freq: Every day | ORAL | 3 refills | Status: DC

## 2020-09-10 NOTE — Progress Notes (Signed)
BP 119/65   Pulse 80   Ht 5' 6"  (1.676 m)   Wt 187 lb (84.8 kg)   SpO2 98%   BMI 30.18 kg/m    Subjective:   Patient ID: [name], female    DOB: [birth year], 80 y.o.   MRN: [id number]  HPI: [name] is a 80 y.o. female presenting on 09/10/2020 for Medical Management of Chronic Issues, Anxiety, and Hypertension   HPI Anxiety and depression Patient is coming in for anxiety depression recheck.  She currently takes Effexor and possibly Cymbalta and Seroquel and clonazepam as needed.  She says she still gets anxious in the evenings. Current rx-clonazepam 0.25 3 times daily as needed # meds rx-90 Effectiveness of current meds-works well Adverse reactions form meds-none  Pill count performed-No Last drug screen -08/19/2019 ( high risk q45m moderate risk q685mlow risk yearly ) Urine drug screen today- Yes Was the N[place]iewed-yes  If yes were their any concerning findings? -None  No flowsheet data found.   Controlled substance contract signed on: Today  Hyperlipidemia Patient is coming in for recheck of his hyperlipidemia. The patient is currently taking simvastatin but she says she could not tolerate or take it.. They deny any issues with myalgias or history of liver damage from it. They deny any focal numbness or weakness or chest pain.   Hypothyroidism recheck, history of thyroid cancer Patient is coming in for thyroid recheck today as well. They deny any issues with hair changes or heat or cold problems or diarrhea or constipation. They deny any chest pain or palpitations. They are currently on levothyroxine 88 micrograms   Patient currently has a tracheostomy in place and has to change supplies but she says she is had to pay out of pocket for the supplies and has not been well covered.  Relevant past medical, surgical, family and social history reviewed and updated as indicated. Interim medical history since our last visit reviewed. Allergies and medications  reviewed and updated.  Review of Systems  Constitutional: Negative for chills and fever.  HENT: Negative for congestion, ear discharge and ear pain.   Eyes: Negative for redness and visual disturbance.  Respiratory: Negative for chest tightness and shortness of breath.   Cardiovascular: Negative for chest pain and leg swelling.  Genitourinary: Negative for difficulty urinating and dysuria.  Musculoskeletal: Negative for back pain and gait problem.  Skin: Negative for rash.  Neurological: Negative for light-headedness and headaches.  Psychiatric/Behavioral: Negative for agitation and behavioral problems.  All other systems reviewed and are negative.   Per HPI unless specifically indicated above   Allergies as of 09/10/2020      Reactions   Lipitor [atorvastatin Calcium] Other (See Comments)   myalgias   Lyrica [pregabalin] Other (See Comments)   SORES IN MOUTH    Sibutramine Hcl Monohydrate Other (See Comments)   UNSPECIFIED REACTION    Alendronate Sodium Nausea Only   Latex Rash, Other (See Comments)   RA to latex 1982; includes bandaids    Levofloxacin Nausea And Vomiting   Meloxicam Other (See Comments)   Vaginal itching       Medication List       Accurate as of September 10, 2020  1:39 PM. If you have any questions, ask your nurse or doctor.        STOP taking these medications   DULoxetine 30 MG capsule Commonly known as: CYMBALTA Stopped by: JoWorthy RancherMD     TAKE these  medications   aspirin 81 MG chewable tablet Chew 81 mg by mouth at bedtime.   benzonatate 200 MG capsule Commonly known as: TESSALON Take 1 capsule (200 mg total) by mouth 3 (three) times daily as needed.   calcium carbonate 1250 (500 Ca) MG tablet Commonly known as: OS-CAL - dosed in mg of elemental calcium Take 1 tablet (500 mg of elemental calcium total) by mouth daily with breakfast.   clonazePAM 0.25 MG disintegrating tablet Commonly known as: KLONOPIN DISSOLVE 1 TABLET BY  MOUTH 3 TIMES A DAY   furosemide 20 MG tablet Commonly known as: LASIX Take 10 mg by mouth daily. 10 mg po  Mon/Wed/Fridays   hydrocortisone 2.5 % rectal cream Commonly known as: ANUSOL-HC APPLY RECTALLY 2 TIMES A DAY AS NEEDED   ketoconazole 2 % cream Commonly known as: NIZORAL Apply topically daily.   latanoprost 0.005 % ophthalmic solution Commonly known as: XALATAN Place 1 drop into both eyes at bedtime.   levothyroxine 88 MCG tablet Commonly known as: SYNTHROID Take 100 mcg by mouth daily before breakfast.   linaclotide 72 MCG capsule Commonly known as: Linzess Take 1 capsule (72 mcg total) by mouth daily before breakfast.   loratadine 10 MG tablet Commonly known as: CLARITIN TAKE 1 TABLET ONCE DAILY   magnesium oxide 400 (241.3 Mg) MG tablet Commonly known as: MAG-OX Take 1 tablet (400 mg total) by mouth daily. What changed: when to take this   multivitamin with minerals Tabs tablet Take 1 tablet by mouth daily.   potassium chloride 10 MEQ tablet Commonly known as: KLOR-CON TAKE 2 TABLETS TWICE A DAY   QUEtiapine 25 MG tablet Commonly known as: SEROQUEL TAKE 3 TABLETS AT BEDTIME AS DIRECTED   simvastatin 80 MG tablet Commonly known as: ZOCOR Take 1 tablet (80 mg total) by mouth daily.   venlafaxine 75 MG tablet Commonly known as: EFFEXOR Take 1 tablet (75 mg total) by mouth daily. What changed:   medication strength  how much to take  when to take this Changed by: Worthy Rancher, MD            Durable Medical Equipment  (From admission, onward)         Start     Ordered   09/10/20 0000  For home use only DME Other see comment       Comments: Tracheostomy supplies, diagnosis tracheostomy care Collar-1/week Pads-change twice daily Tube-1 per 3 months Give enough supplies for 1 year  Question:  Length of Need  Answer:  Lifetime   09/10/20 1335           Objective:   BP 119/65   Pulse 80   Ht 5' 6"  (1.676 m)   Wt 187 lb  (84.8 kg)   SpO2 98%   BMI 30.18 kg/m   Wt Readings from Last 3 Encounters:  09/10/20 187 lb (84.8 kg)  07/22/20 189 lb (85.7 kg)  05/27/20 187 lb (84.8 kg)    Physical Exam Vitals and nursing note reviewed.  Constitutional:      General: She is not in acute distress.    Appearance: She is well-developed. She is not diaphoretic.  Eyes:     Conjunctiva/sclera: Conjunctivae normal.  Cardiovascular:     Rate and Rhythm: Normal rate and regular rhythm.     Heart sounds: Normal heart sounds. No murmur heard.   Pulmonary:     Effort: Pulmonary effort is normal. No respiratory distress.     Breath sounds:  Normal breath sounds. No wheezing.  Musculoskeletal:        General: No tenderness. Normal range of motion.  Skin:    General: Skin is warm and dry.     Findings: No rash.  Neurological:     Mental Status: She is alert and oriented to person, place, and time.     Coordination: Coordination normal.  Psychiatric:        Behavior: Behavior normal.       Assessment & Plan:   Problem List Items Addressed This Visit      Endocrine   Thyroid cancer ([place]) - Primary   Relevant Orders   CBC with Differential/Platelet   CMP14+EGFR   Hypothyroidism   Relevant Orders   TSH     Other   Depression, recurrent (HCC)   Relevant Medications   clonazePAM (KLONOPIN) 0.25 MG disintegrating tablet   venlafaxine (EFFEXOR) 75 MG tablet   Other Relevant Orders   ToxASSURE Select 13 (MW), Urine   CBC with Differential/Platelet   CMP14+EGFR   GAD (generalized anxiety disorder)   Relevant Medications   clonazePAM (KLONOPIN) 0.25 MG disintegrating tablet   venlafaxine (EFFEXOR) 75 MG tablet   Other Relevant Orders   ToxASSURE Select 13 (MW), Urine   CBC with Differential/Platelet   CMP14+EGFR   Pure hypercholesterolemia   Relevant Medications   simvastatin (ZOCOR) 80 MG tablet   Other Relevant Orders   CBC with Differential/Platelet   CMP14+EGFR   Lipid panel    Other Visit  Diagnoses    Tracheostomy care [facility])       Relevant Orders   For home use only DME Other see comment      Somehow she was taking both Effexor and Cymbalta, want her to stop the Cymbalta will increase the Effexor to help more with daytime anxiety.  Continue Seroquel Follow up plan: Return in about 3 months (around 12/11/2020), or if symptoms worsen or fail to improve, for Anxiety and thyroid and cholesterol..  Counseling provided for all of the vaccine components Orders Placed This Encounter  Procedures  . For home use only DME Other see comment  . ToxASSURE Select 13 (MW), Urine  . CBC with Differential/Platelet  . CMP14+EGFR  . TSH  . Lipid panel    Caryl Pina, MD [facility] 09/10/2020, 1:39 PM

## 2020-09-11 LAB — LIPID PANEL
Chol/HDL Ratio: 2.2 ratio (ref 0.0–4.4)
Cholesterol, Total: 138 mg/dL (ref 100–199)
HDL: 64 mg/dL (ref >39)
LDL Chol Calc (NIH): 56 mg/dL (ref 0–99)
Triglycerides: 95 mg/dL (ref 0–149)
VLDL Cholesterol Cal: 18 mg/dL (ref 5–40)

## 2020-09-11 LAB — CMP14+EGFR
ALT: 12 IU/L (ref 0–32)
AST: 24 IU/L (ref 0–40)
Albumin/Globulin Ratio: 2.2 (ref 1.2–2.2)
Albumin: 4.4 g/dL (ref 3.7–4.7)
Alkaline Phosphatase: 123 IU/L — ABNORMAL HIGH (ref 44–121)
BUN/Creatinine Ratio: 11 — ABNORMAL LOW (ref 12–28)
BUN: 12 mg/dL (ref 8–27)
Bilirubin Total: 0.4 mg/dL (ref 0.0–1.2)
CO2: 23 mmol/L (ref 20–29)
Calcium: 8.7 mg/dL (ref 8.7–10.3)
Chloride: 103 mmol/L (ref 96–106)
Creatinine, Ser: 1.13 mg/dL — ABNORMAL HIGH (ref 0.57–1.00)
Globulin, Total: 2 g/dL (ref 1.5–4.5)
Glucose: 85 mg/dL (ref 65–99)
Potassium: 4.3 mmol/L (ref 3.5–5.2)
Sodium: 142 mmol/L (ref 134–144)
Total Protein: 6.4 g/dL (ref 6.0–8.5)
eGFR: 50 mL/min/{1.73_m2} — ABNORMAL LOW (ref >59)

## 2020-09-11 LAB — CBC WITH DIFFERENTIAL/PLATELET
Basophils Absolute: 0 10*3/uL (ref 0.0–0.2)
Basos: 1 %
EOS (ABSOLUTE): 0.2 10*3/uL (ref 0.0–0.4)
Eos: 3 %
Hematocrit: 36.8 % (ref 34.0–46.6)
Hemoglobin: 12.2 g/dL (ref 11.1–15.9)
Immature Grans (Abs): 0 10*3/uL (ref 0.0–0.1)
Immature Granulocytes: 0 %
Lymphocytes Absolute: 1.7 10*3/uL (ref 0.7–3.1)
Lymphs: 27 %
MCH: 29 pg (ref 26.6–33.0)
MCHC: 33.2 g/dL (ref 31.5–35.7)
MCV: 88 fL (ref 79–97)
Monocytes Absolute: 0.5 10*3/uL (ref 0.1–0.9)
Monocytes: 9 %
Neutrophils Absolute: 3.8 10*3/uL (ref 1.4–7.0)
Neutrophils: 60 %
Platelets: 211 10*3/uL (ref 150–450)
RBC: 4.2 x10E6/uL (ref 3.77–5.28)
RDW: 13.1 % (ref 11.7–15.4)
WBC: 6.2 10*3/uL (ref 3.4–10.8)

## 2020-09-11 LAB — TSH: TSH: 0.326 u[IU]/mL — ABNORMAL LOW (ref 0.450–4.500)

## 2020-09-15 DIAGNOSIS — L570 Actinic keratosis: Secondary | ICD-10-CM | POA: Diagnosis not present

## 2020-09-15 DIAGNOSIS — X32XXXD Exposure to sunlight, subsequent encounter: Secondary | ICD-10-CM | POA: Diagnosis not present

## 2020-09-15 DIAGNOSIS — C44319 Basal cell carcinoma of skin of other parts of face: Secondary | ICD-10-CM | POA: Diagnosis not present

## 2020-09-20 LAB — TOXASSURE SELECT 13 (MW), URINE

## 2020-09-21 ENCOUNTER — Other Ambulatory Visit: Payer: Self-pay | Admitting: Family Medicine

## 2020-09-21 DIAGNOSIS — F411 Generalized anxiety disorder: Secondary | ICD-10-CM

## 2020-09-21 DIAGNOSIS — F339 Major depressive disorder, recurrent, unspecified: Secondary | ICD-10-CM

## 2020-09-22 ENCOUNTER — Ambulatory Visit (INDEPENDENT_AMBULATORY_CARE_PROVIDER_SITE_OTHER): Payer: Medicare Other | Admitting: *Deleted

## 2020-09-22 ENCOUNTER — Other Ambulatory Visit: Payer: Self-pay

## 2020-09-22 ENCOUNTER — Other Ambulatory Visit: Payer: Self-pay | Admitting: Family Medicine

## 2020-09-22 DIAGNOSIS — E538 Deficiency of other specified B group vitamins: Secondary | ICD-10-CM | POA: Diagnosis not present

## 2020-09-22 NOTE — Progress Notes (Signed)
Pt given B12 injection IM right deltoid and tolerated well. 

## 2020-09-23 ENCOUNTER — Other Ambulatory Visit: Payer: Self-pay

## 2020-09-23 DIAGNOSIS — Z430 Encounter for attention to tracheostomy: Secondary | ICD-10-CM | POA: Insufficient documentation

## 2020-09-23 DIAGNOSIS — K5900 Constipation, unspecified: Secondary | ICD-10-CM | POA: Insufficient documentation

## 2020-09-23 DIAGNOSIS — E209 Hypoparathyroidism, unspecified: Secondary | ICD-10-CM | POA: Insufficient documentation

## 2020-09-23 DIAGNOSIS — Z85850 Personal history of malignant neoplasm of thyroid: Secondary | ICD-10-CM | POA: Insufficient documentation

## 2020-09-24 ENCOUNTER — Ambulatory Visit (INDEPENDENT_AMBULATORY_CARE_PROVIDER_SITE_OTHER): Payer: Medicare Other

## 2020-09-24 VITALS — Ht 66.0 in | Wt 187.0 lb

## 2020-09-24 DIAGNOSIS — Z0000 Encounter for general adult medical examination without abnormal findings: Secondary | ICD-10-CM | POA: Diagnosis not present

## 2020-09-24 NOTE — Patient Instructions (Signed)
[name] , Thank you for taking time to come for your Medicare Wellness Visit. I appreciate your ongoing commitment to your health goals. Please review the following plan we discussed and let me know if I can assist you in the future.   Screening recommendations/referrals: Colonoscopy: Done 06/15/12 - No longer required Mammogram: Done 09/20/2016 - No longer required Bone Density: Done 07/10/2019 - Osteoporosis - Repeat in 2 years Recommended yearly ophthalmology/optometry visit for glaucoma screening and checkup Recommended yearly dental visit for hygiene and checkup  Vaccinations: Influenza vaccine: Done 04/15/2020 Pneumococcal vaccine: Done 11/15/13 & 09/06/2007 Tdap vaccine: Done 01/17/2014 - Repeat in 2025 Shingles vaccine: Shingrix discussed. Please contact your pharmacy for coverage information.    Covid-19: Complete: 09/30/19, 10/21/19, & 05/06/20  Advanced directives: Please bring a copy of your health care power of attorney and living will to the office to be added to your chart at your convenience.  Conditions/risks identified: Work on increasing physical activity, strength and balance exercises.  Next appointment: Follow up in one year for your annual wellness visit    Preventive Care 65 Years and Older, Female Preventive care refers to lifestyle choices and visits with your health care provider that can promote health and wellness. What does preventive care include?  A yearly physical exam. This is also called an annual well check.  Dental exams once or twice a year.  Routine eye exams. Ask your health care provider how often you should have your eyes checked.  Personal lifestyle choices, including:  Daily care of your teeth and gums.  Regular physical activity.  Eating a healthy diet.  Avoiding tobacco and drug use.  Limiting alcohol use.  Practicing safe sex.  Taking low-dose aspirin every day.  Taking vitamin and mineral supplements as recommended by your  health care provider. What happens during an annual well check? The services and screenings done by your health care provider during your annual well check will depend on your age, overall health, lifestyle risk factors, and family history of disease. Counseling  Your health care provider may ask you questions about your:  Alcohol use.  Tobacco use.  Drug use.  Emotional well-being.  Home and relationship well-being.  Sexual activity.  Eating habits.  History of falls.  Memory and ability to understand (cognition).  Work and work Statistician.  Reproductive health. Screening  You may have the following tests or measurements:  Height, weight, and BMI.  Blood pressure.  Lipid and cholesterol levels. These may be checked every 5 years, or more frequently if you are over 62 years old.  Skin check.  Lung cancer screening. You may have this screening every year starting at age 41 if you have a 30-pack-year history of smoking and currently smoke or have quit within the past 15 years.  Fecal occult blood test (FOBT) of the stool. You may have this test every year starting at age 93.  Flexible sigmoidoscopy or colonoscopy. You may have a sigmoidoscopy every 5 years or a colonoscopy every 10 years starting at age 33.  Hepatitis C blood test.  Hepatitis B blood test.  Sexually transmitted disease (STD) testing.  Diabetes screening. This is done by checking your blood sugar (glucose) after you have not eaten for a while (fasting). You may have this done every 1-3 years.  Bone density scan. This is done to screen for osteoporosis. You may have this done starting at age 64.  Mammogram. This may be done every 1-2 years. Talk to your health  care provider about how often you should have regular mammograms. Talk with your health care provider about your test results, treatment options, and if necessary, the need for more tests. Vaccines  Your health care provider may recommend  certain vaccines, such as:  Influenza vaccine. This is recommended every year.  Tetanus, diphtheria, and acellular pertussis (Tdap, Td) vaccine. You may need a Td booster every 10 years.  Zoster vaccine. You may need this after age 58.  Pneumococcal 13-valent conjugate (PCV13) vaccine. One dose is recommended after age 39.  Pneumococcal polysaccharide (PPSV23) vaccine. One dose is recommended after age 51. Talk to your health care provider about which screenings and vaccines you need and how often you need them. This information is not intended to replace advice given to you by your health care provider. Make sure you discuss any questions you have with your health care provider. Document Released: 07/10/2015 Document Revised: 03/02/2016 Document Reviewed: 04/14/2015 Elsevier Interactive Patient Education  2017 [place] Prevention in the Home Falls can cause injuries. They can happen to people of all ages. There are many things you can do to make your home safe and to help prevent falls. What can I do on the outside of my home?  Regularly fix the edges of walkways and driveways and fix any cracks.  Remove anything that might make you trip as you walk through a door, such as a raised step or threshold.  Trim any bushes or trees on the path to your home.  Use bright outdoor lighting.  Clear any walking paths of anything that might make someone trip, such as rocks or tools.  Regularly check to see if handrails are loose or broken. Make sure that both sides of any steps have handrails.  Any raised decks and porches should have guardrails on the edges.  Have any leaves, snow, or ice cleared regularly.  Use sand or salt on walking paths during winter.  Clean up any spills in your garage right away. This includes oil or grease spills. What can I do in the bathroom?  Use night lights.  Install grab bars by the toilet and in the tub and shower. Do not use towel bars as  grab bars.  Use non-skid mats or decals in the tub or shower.  If you need to sit down in the shower, use a plastic, non-slip stool.  Keep the floor dry. Clean up any water that spills on the floor as soon as it happens.  Remove soap buildup in the tub or shower regularly.  Attach bath mats securely with double-sided non-slip rug tape.  Do not have throw rugs and other things on the floor that can make you trip. What can I do in the bedroom?  Use night lights.  Make sure that you have a light by your bed that is easy to reach.  Do not use any sheets or blankets that are too big for your bed. They should not hang down onto the floor.  Have a firm chair that has side arms. You can use this for support while you get dressed.  Do not have throw rugs and other things on the floor that can make you trip. What can I do in the kitchen?  Clean up any spills right away.  Avoid walking on wet floors.  Keep items that you use a lot in easy-to-reach places.  If you need to reach something above you, use a strong step stool that has a grab  bar.  Keep electrical cords out of the way.  Do not use floor polish or wax that makes floors slippery. If you must use wax, use non-skid floor wax.  Do not have throw rugs and other things on the floor that can make you trip. What can I do with my stairs?  Do not leave any items on the stairs.  Make sure that there are handrails on both sides of the stairs and use them. Fix handrails that are broken or loose. Make sure that handrails are as long as the stairways.  Check any carpeting to make sure that it is firmly attached to the stairs. Fix any carpet that is loose or worn.  Avoid having throw rugs at the top or bottom of the stairs. If you do have throw rugs, attach them to the floor with carpet tape.  Make sure that you have a light switch at the top of the stairs and the bottom of the stairs. If you do not have them, ask someone to add them  for you. What else can I do to help prevent falls?  Wear shoes that:  Do not have high heels.  Have rubber bottoms.  Are comfortable and fit you well.  Are closed at the toe. Do not wear sandals.  If you use a stepladder:  Make sure that it is fully opened. Do not climb a closed stepladder.  Make sure that both sides of the stepladder are locked into place.  Ask someone to hold it for you, if possible.  Clearly mark and make sure that you can see:  Any grab bars or handrails.  First and last steps.  Where the edge of each step is.  Use tools that help you move around (mobility aids) if they are needed. These include:  Canes.  Walkers.  Scooters.  Crutches.  Turn on the lights when you go into a dark area. Replace any light bulbs as soon as they burn out.  Set up your furniture so you have a clear path. Avoid moving your furniture around.  If any of your floors are uneven, fix them.  If there are any pets around you, be aware of where they are.  Review your medicines with your doctor. Some medicines can make you feel dizzy. This can increase your chance of falling. Ask your doctor what other things that you can do to help prevent falls. This information is not intended to replace advice given to you by your health care provider. Make sure you discuss any questions you have with your health care provider. Document Released: 04/09/2009 Document Revised: 11/19/2015 Document Reviewed: 07/18/2014 Elsevier Interactive Patient Education  2017 [employer].

## 2020-09-24 NOTE — Progress Notes (Signed)
Subjective:   [name] is a 80 y.o. female who presents for Medicare Annual (Subsequent) preventive examination.  Virtual Visit via Telephone Note  I connected with  [name] on 09/24/20 at  4:15 PM EDT by telephone and verified that I am speaking with the correct person using two identifiers.  Location: Patient: Home Provider: [facility] Persons participating in the virtual visit: patient/Nurse Health Advisor   I discussed the limitations, risks, security and privacy concerns of performing an evaluation and management service by telephone and the availability of in person appointments. The patient expressed understanding and agreed to proceed.  Interactive audio and video telecommunications were attempted between this nurse and patient, however failed, due to patient having technical difficulties OR patient did not have access to video capability.  We continued and completed visit with audio only.  Some vital signs may be absent or patient reported.   Fredericka Bottcher E Nichollas Perusse, LPN   Review of Systems     Cardiac Risk Factors include: advanced age (>16men, >63 women);dyslipidemia;hypertension;sedentary lifestyle     Objective:    Today's Vitals   09/24/20 1557  Weight: 187 lb (84.8 kg)  Height: 5\' 6"  (1.676 m)   Body mass index is 30.18 kg/m.  Advanced Directives 09/24/2020 04/17/2019 04/12/2018 01/18/2018 12/27/2017 11/28/2017 08/12/2017  Does Patient Have a Medical Advance Directive? No No No No No No No  Type of Advance Directive - - - - - - -  Does patient want to make changes to medical advance directive? - - - - - - -  Copy of [place] in Chart? - - - - - - -  Would patient like information on creating a medical advance directive? No - Patient declined No - Patient declined No - Patient declined No - Patient declined No - Patient declined No - Patient declined No - Patient declined  Pre-existing out of facility DNR order (yellow form or pink MOST form) - -  - - - - -    Current Medications (verified) Outpatient Encounter Medications as of 09/24/2020  Medication Sig  . aspirin 81 MG chewable tablet Chew 81 mg by mouth at bedtime.   . benzonatate (TESSALON) 200 MG capsule Take 1 capsule (200 mg total) by mouth 3 (three) times daily as needed.  . calcium carbonate (OS-CAL - DOSED IN MG OF ELEMENTAL CALCIUM) 1250 (500 Ca) MG tablet Take 1 tablet (500 mg of elemental calcium total) by mouth daily with breakfast.  . Cholecalciferol (VITAMIN D3) 25 MCG (1000 UT) CAPS 2 capsules  . clonazePAM (KLONOPIN) 0.25 MG disintegrating tablet DISSOLVE 1 TABLET BY MOUTH 3 TIMES A DAY  . furosemide (LASIX) 20 MG tablet Take 10 mg by mouth daily. 10 mg po  Mon/Wed/Fridays  . hydrocortisone (ANUSOL-HC) 2.5 % rectal cream APPLY RECTALLY 2 TIMES A DAY AS NEEDED  . ketoconazole (NIZORAL) 2 % cream Apply topically daily.  Marland Kitchen latanoprost (XALATAN) 0.005 % ophthalmic solution Place 1 drop into both eyes at bedtime.  Marland Kitchen levothyroxine (SYNTHROID) 100 MCG tablet Take 100 mcg by mouth daily.  Marland Kitchen linaclotide (LINZESS) 72 MCG capsule Take 1 capsule (72 mcg total) by mouth daily before breakfast.  . loratadine (CLARITIN) 10 MG tablet TAKE 1 TABLET ONCE DAILY  . magnesium oxide (MAG-OX) 400 (241.3 Mg) MG tablet Take 1 tablet (400 mg total) by mouth daily. (Patient taking differently: Take 400 mg by mouth 2 (two) times daily.)  . Multiple Vitamin (MULTIVITAMIN WITH MINERALS) TABS tablet Take  1 tablet by mouth daily.  . potassium chloride (KLOR-CON) 10 MEQ tablet TAKE 2 TABLETS TWICE A DAY  . QUEtiapine (SEROQUEL) 25 MG tablet TAKE 3 TABLETS AT BEDTIME AS DIRECTED  . simvastatin (ZOCOR) 80 MG tablet Take 1 tablet (80 mg total) by mouth daily.  Marland Kitchen triamcinolone (KENALOG) 0.1 % Apply topically 2 (two) times daily as needed.  . venlafaxine (EFFEXOR) 75 MG tablet Take 1 tablet (75 mg total) by mouth daily.   Facility-Administered Encounter Medications as of 09/24/2020  Medication  .  cyanocobalamin ((VITAMIN B-12)) injection 1,000 mcg    Allergies (verified) Lipitor [atorvastatin calcium], Lyrica [pregabalin], Motrin [ibuprofen], Sibutramine hcl monohydrate, Alendronate sodium, Latex, Levofloxacin, and Meloxicam   History: Past Medical History:  Diagnosis Date  . ACL tear    right  Dr. Gladstone Pih   . Adenomatous polyp   . Anxiety   . Arthritis    "all over" (04/12/2018)  . Ascending aortic aneurysm ([place])    note per chart per Dr Lucianne Lei Tright 4.7 cm 04/15/2015   . Asthma   . B12 deficiency   . Chronic bronchitis ([place])   . Depression   . DUB (dysfunctional uterine bleeding) 10/96  . Fibromyalgia   . GERD (gastroesophageal reflux disease)   . Glaucoma    bilaterally  . Helicobacter pylori (H. pylori)   . Hiatal hernia   . History of blood transfusion 06/2017   "related to OHS"  . History of kidney stones   . History of left shoulder fracture    pt states fell off bed and broke ball in shoulder had rod placed   . Hyperlipidemia   . Hypertension   . Hypothyroidism   . Inspiratory stridor   . Laryngeal stenosis   . Occasional tremors    left arm   . Osteoarthritis   . Osteopenia   . Osteoporosis   . Thyroid cancer ([place]) 2017  . Thyroid nodule   . Tracheostomy in place [facility])    Past Surgical History:  Procedure Laterality Date  . AORTIC VALVE REPLACEMENT N/A 07/10/2017   Procedure: AORTIC VALVE REPLACEMENT (AVR);  Surgeon: [name], Collier Salina, MD;  Location: [place];  Service: Open Heart Surgery;  Laterality: N/A;  . APPENDECTOMY    . BUNIONECTOMY  08/1999   right - Dr. Irving Shows   . CARDIAC VALVE REPLACEMENT    . CATARACT EXTRACTION, BILATERAL Bilateral 05/28/07-2009   "right-left"  . CHOLECYSTECTOMY OPEN  1979  . DILATION AND CURETTAGE OF UTERUS  03/31/95   Dr. Ovid Curd   . DIRECT LARYNGOSCOPY WITH BOTOX INJECTION N/A 12/01/2017   Procedure: SUSPENDED DIRECT MICROLARYNGOSCOPY WITH KENALOG INJECTION, CO2 LASER;  Surgeon: Melida Quitter, MD;  Location: [place];   Service: ENT;  Laterality: N/A;  . EXCISIONAL HEMORRHOIDECTOMY    . EXPLORATION POST OPERATIVE OPEN HEART N/A 07/11/2017   Procedure: EXPLORATION POST OPERATIVE OPEN HEART;  Surgeon: Ivin Poot, MD;  Location: [place];  Service: Open Heart Surgery;  Laterality: N/A;  . EYE SURGERY     also had left cataract removed   . FRACTURE SURGERY    . HEMATOMA EVACUATION N/A 07/11/2017   Procedure: EVACUATION HEMATOMA;  Surgeon: Ivin Poot, MD;  Location: [place];  Service: Open Heart Surgery;  Laterality: N/A;  . HERNIA REPAIR    . JOINT REPLACEMENT    . MICROLARYNGOSCOPY WITH LASER AND BALLOON DILATION N/A 01/24/2018   Procedure: MICROLARYNGOSCOPY WITH LASER AND BALLOON DILATION;  Surgeon: Melida Quitter, MD;  Location: [place];  Service: ENT;  Laterality: N/A;  . MICROLARYNGOSCOPY WITH LASER AND BALLOON DILATION N/A 04/13/2018   Procedure: MICROLARYNGOSCOPY WITH BALLOON DILATION;  Surgeon: Melida Quitter, MD;  [facility];  Service: ENT;  Laterality: N/A;  . NEPHROLITHOTOMY Left 04/07/2017   Procedure: NEPHROLITHOTOMY PERCUTANEOUS WITH SURGEON ACCESS;  Surgeon: Ardis Hughs, MD;  [facility];  Service: Urology;  Laterality: Left;  . ORIF HUMERUS FRACTURE  05/30/2011   Procedure: OPEN REDUCTION INTERNAL FIXATION (ORIF) PROXIMAL HUMERUS FRACTURE;  Surgeon: Augustin Schooling;  Location: [place];  Service: Orthopedics;  Laterality: Left;  open reduction internal fixation of proximal humerus fracture  . PLACEMENT OF CENTRIMAG VENTRICULAR ASSIST DEVICE Right 07/11/2017   Procedure: PLACEMENT OF CENTRIMAG VENTRICULAR ASSIST DEVICE;  Surgeon: Ivin Poot, MD;  Location: [place];  Service: Open Heart Surgery;  Laterality: Right;  . REMOVAL OF CENTRIMAG VENTRICULAR ASSIST DEVICE N/A 07/17/2017   Procedure: REMOVAL OF RVAD WITH PUMP STANDBY;  Surgeon: Ivin Poot, MD;  Location: [place];  Service: Open Heart Surgery;  Laterality: N/A;  . REPLACEMENT ASCENDING AORTA N/A 07/10/2017   Procedure:  REPLACEMENT ASCENDING AORTA;  Surgeon: Ivin Poot, MD;  Location: [place];  Service: Open Heart Surgery;  Laterality: N/A;  . RIGHT/LEFT HEART CATH AND CORONARY ANGIOGRAPHY N/A 06/13/2017   Procedure: RIGHT/LEFT HEART CATH AND CORONARY ANGIOGRAPHY;  Surgeon: Jolaine Artist, MD;  Location: [place] [facility];  Service: Cardiovascular;  Laterality: N/A;  . TEE WITHOUT CARDIOVERSION N/A 07/10/2017   Procedure: TRANSESOPHAGEAL ECHOCARDIOGRAM (TEE);  Surgeon: [name], Collier Salina, MD;  Location: [place];  Service: Open Heart Surgery;  Laterality: N/A;  . TEE WITHOUT CARDIOVERSION  07/11/2017   Procedure: TRANSESOPHAGEAL ECHOCARDIOGRAM (TEE);  Surgeon: [name], Collier Salina, MD;  Location: [place];  Service: Open Heart Surgery;;  . TEE WITHOUT CARDIOVERSION N/A 07/17/2017   Procedure: TRANSESOPHAGEAL ECHOCARDIOGRAM (TEE);  Surgeon: [name], Collier Salina, MD;  Location: [place];  Service: Open Heart Surgery;  Laterality: N/A;  . THYROID LOBECTOMY Left 07/27/2015   Procedure: LEFT THYROID LOBECTOMY;  Surgeon: Fanny Skates, MD;  [facility];  Service: General;  Laterality: Left;  . THYROIDECTOMY N/A 08/06/2015   Procedure: RIGHT THYROID LOBECTOMY, REIMPLANTATION PARATHYROID;  Surgeon: Fanny Skates, MD;  [facility];  Service: General;  Laterality: N/A;  . TOTAL KNEE ARTHROPLASTY Right 08/2009   Dr. Gladstone Pih  . TRACHEOSTOMY  08/08/2017  . VENTRAL HERNIA REPAIR  2/99   Family History  Problem Relation Age of Onset  . Cancer Mother        PANCREATIC   . Osteoporosis Mother   . Hip fracture Mother   . Heart attack Father 63       Died 68  . Heart disease Father   . Arthritis Father   . Hypertension Sister   . Cancer Sister        skin  . Hypertension Sister   . Cancer Sister        breast cancer  . Breast cancer Neg Hx    Social History   Socioeconomic History  . Marital status: Married    Spouse name: Laverna Peace  . Number of children: 1  . Years of education: 44  . Highest education level:  11th grade  Occupational History  . Occupation: homemaker  Tobacco Use  . Smoking status: Never Smoker  . Smokeless tobacco: Never Used  Vaping Use  . Vaping Use: Never used  Substance and Sexual Activity  . Alcohol use: No  .  Drug use: Never  . Sexual activity: Not Currently    Birth control/protection: Post-menopausal  Other Topics Concern  . Not on file  Social History Narrative   Lives with husband.    Social Determinants of Health   Financial Resource Strain: Low Risk   . Difficulty of Paying Living Expenses: Not hard at all  Food Insecurity: No Food Insecurity  . Worried About Charity fundraiser in the Last Year: Never true  . Ran Out of Food in the Last Year: Never true  Transportation Needs: No Transportation Needs  . Lack of Transportation (Medical): No  . Lack of Transportation (Non-Medical): No  Physical Activity: Insufficiently Active  . Days of Exercise per Week: 3 days  . Minutes of Exercise per Session: 20 min  Stress: Stress Concern Present  . Feeling of Stress : To some extent  Social Connections: Moderately Isolated  . Frequency of Communication with Friends and Family: More than three times a week  . Frequency of Social Gatherings with Friends and Family: Three times a week  . Attends Religious Services: Never  . Active Member of Clubs or Organizations: No  . Attends Archivist Meetings: Never  . Marital Status: Married    Tobacco Counseling Counseling given: Not Answered   Clinical Intake:  Pre-visit preparation completed: Yes  Pain : No/denies pain     BMI - recorded: 30.18 Nutritional Status: BMI > 30  Obese Nutritional Risks: None Diabetes: No  How often do you need to have someone help you when you read instructions, pamphlets, or other written materials from your doctor or pharmacy?: 1 - Never  Diabetic? No  Interpreter Needed?: No  Information entered by :: Mostafa Yuan, LPN   Activities of Daily Living In your  present state of health, do you have any difficulty performing the following activities: 09/24/2020  Hearing? N  Vision? N  Difficulty concentrating or making decisions? N  Walking or climbing stairs? N  Dressing or bathing? N  Preparing Food and eating ? N  Using the Toilet? N  In the past six months, have you accidently leaked urine? Y  Comment wears pads for protection  Do you have problems with loss of bowel control? N  Managing your Medications? N  Managing your Finances? Y  Comment husband Engineer, mining? N  Some recent data might be hidden    Patient Care Team: [name], [name], MD as PCP - General (Family Medicine) Minus Breeding, MD as Consulting Physician (Cardiology) Harlen Labs, MD as Referring Physician (Optometry) Melida Quitter, MD as Consulting Physician (Otolaryngology) Shea Evans [name], [name] as Social Worker (Licensed Clinical Social Worker) Delrae Rend, MD as Consulting Physician (Endocrinology)  Indicate any recent [place] you may have received from other than [facility] providers in the past year (date may be approximate).     Assessment:   This is a routine wellness examination for [place].  Hearing/Vision screen  Hearing Screening   125Hz  250Hz  500Hz  1000Hz  2000Hz  3000Hz  4000Hz  6000Hz  8000Hz   Right ear:           Left ear:           Comments: No hearing complaints  Vision Screening Comments: Annual visits with Dr Marin Comment  Dietary issues and exercise activities discussed: Current Exercise Habits: Home exercise routine, Type of exercise: walking, Time (Minutes): 20, Frequency (Times/Week): 3, Weekly Exercise (Minutes/Week): 60, Intensity: Mild, Exercise limited by: orthopedic condition(s)  Goals    .  Client will talk  with [name] in next 30 days about managing anxiety issues for client (pt-stated)      Current Barriers:  . Client fatigues easily . Client has tracheostomy and has Chronic Diagnoses of  CHF,GAD, Vitamin B12 deficiency, osteoarthritis and GERD   Clinical Social Work Clinical Goal(s):  . Client and [name] to communicate in next 30 days to discuss client management of anxiety issues of client  Interventions: . [name] talked with [name] about nursing support with RNCM . [name] talked with [name] about [name] support . [name] talked with Lezlie about her in home care needs of client  . Talked with [name] about ambulation needs (she uses a cane to help her  walk) . Talked with client about meal provision for client . Talked with client about relaxation techniques of choice (she likes to watch TV, or to sit on porch, talk on the phone with family members, likes to go out to eat)  Patient Self Care Activities:  . Takes medications as prescribed . Attends scheduled medical appointments  Plan:   Client to attend scheduled medical appointments [name] to call client in next 3 weeks to talk with her about anxiety symptoms management of client Client to call RNCM as needed to discuss nursing needs of client Client to use relaxation techniques of choice to manage anxiety or stress symptoms  Initial goal documentation     .  DIET - INCREASE WATER INTAKE      Try to drink 6-8 glasses of water daily.    .  Exercise 150 minutes per week (moderate activity)      Improve balance and hopefully walk better without a cane.      Depression Screen PHQ 2/9 Scores 09/24/2020 09/10/2020 06/12/2020 05/15/2020 04/10/2020 08/14/2019 04/17/2019  PHQ - 2 Score 0 0 0 0 0 0 0  PHQ- 9 Score - - - - - - -    Fall Risk Fall Risk  09/24/2020 09/10/2020 06/12/2020 05/15/2020 04/10/2020  Falls in the past year? 1 0 1 1 0  Number falls in past yr: 0 - 0 0 -  Comment missed a step - - - -  Injury with Fall? 0 - 0 0 -  Comment - - - - -  Risk Factor Category  - - - - -  Risk for fall due to : Medication side effect - Impaired balance/gait Impaired balance/gait -  Risk for fall due to: Comment - - - - -  Follow up  - - Falls evaluation completed Falls evaluation completed -  Comment - - - - -    FALL RISK PREVENTION PERTAINING TO THE HOME:  Any stairs in or around the home? No  If so, are there any without handrails? No  Home free of loose throw rugs in walkways, pet beds, electrical cords, etc? Yes  Adequate lighting in your home to reduce risk of falls? Yes   ASSISTIVE DEVICES UTILIZED TO PREVENT FALLS:  Life alert? No  Use of a cane, walker or w/c? Yes  Grab bars in the bathroom? Yes  Shower chair or bench in shower? Yes  Elevated toilet seat or a handicapped toilet? Yes   TIMED UP AND GO:  Was the test performed? No . Telephonic visit  Cognitive Function: Normal cognitive status assessed by direct observation by this Nurse Health Advisor. No abnormalities found. Patient declined memory test today. MMSE - Mini Mental State Exam 03/12/2018 12/30/2016  Orientation to time 5 5  Orientation to  Place 5 4  Registration 3 3  Attention/ Calculation 4 5  Recall 3 3  Language- name 2 objects 2 2  Language- repeat 1 1  Language- follow 3 step command 3 3  Language- read & follow direction 1 1  Write a sentence 0 0  Write a sentence-comments unable to write or draw due to hand trembling -  Copy design 0 1  Copy design-comments unable to write due to hand tremor -  Total score 27 28     6CIT Screen 04/17/2019  What Year? 0 points  What month? 0 points  What time? 0 points  Count back from 20 0 points  Months in reverse 2 points  Repeat phrase 4 points  Total Score 6    Immunizations Immunization History  Administered Date(s) Administered  . Fluad Quad(high Dose 65+) 04/01/2019, 04/10/2020  . Influenza Split 03/18/2016, 04/15/2020  . Influenza, High Dose Seasonal PF 04/04/2018  . Influenza,inj,Quad PF,6+ Mos 03/27/2013, 04/01/2014, 03/31/2015, 03/22/2016  . Moderna Sars-Covid-2 Vaccination 07/16/2019, 08/13/2019, 09/30/2019, 10/21/2019, 05/06/2020  . Pneumococcal Conjugate-13  11/15/2013  . Pneumococcal Polysaccharide-23 09/06/2007  . Td 01/17/2014    TDAP status: Up to date  Flu Vaccine status: Up to date  Pneumococcal vaccine status: Up to date  Covid-19 vaccine status: Completed vaccines  Qualifies for Shingles Vaccine? Yes   Zostavax completed No   Shingrix Completed?: No.    Education has been provided regarding the importance of this vaccine. Patient has been advised to call insurance company to determine out of pocket expense if they have not yet received this vaccine. Advised may also receive vaccine at local pharmacy or Health Dept. Verbalized acceptance and understanding.  Screening Tests Health Maintenance  Topic Date Due  . DEXA SCAN  07/09/2021  . TETANUS/TDAP  01/18/2024  . INFLUENZA VACCINE  Completed  . COVID-19 Vaccine  Completed  . PNA vac Low Risk Adult  Completed  . HPV VACCINES  Aged Out  . Hepatitis C Screening  Discontinued    Health Maintenance  There are no preventive care reminders to display for this patient.  Colorectal cancer screening: No longer required.   Mammogram status: No longer required due to age.  Bone Density status: Completed 07/10/2019. Results reflect: Bone density results: OSTEOPOROSIS. Repeat every 2 years.  Lung Cancer Screening: (Low Dose CT Chest recommended if Age 19-80 years, 30 pack-year currently smoking OR have quit w/in 15years.) does not qualify.   Additional Screening:  Hepatitis C Screening: does not qualify  Vision Screening: Recommended annual ophthalmology exams for early detection of glaucoma and other disorders of the eye. Is the patient up to date with their annual eye exam?  Yes  Who is the provider or what is the name of the office in which the patient attends annual eye exams? Dr Marin Comment If pt is not established with a provider, would they like to be referred to a provider to establish care? No .   Dental Screening: Recommended annual dental exams for proper oral  hygiene  Community Resource Referral / Chronic Care Management: CRR required this visit?  No   CCM required this visit?  No      Plan:     I have personally reviewed and noted the following in the patient's chart:   . Medical and social history . Use of alcohol, tobacco or illicit drugs  . Current medications and supplements . Functional ability and status . Nutritional status . Physical activity . Advanced directives . List  of other physicians . Hospitalizations, surgeries, and ER visits in previous 12 months . Vitals . Screenings to include cognitive, depression, and falls . Referrals and appointments  In addition, I have reviewed and discussed with patient certain preventive protocols, quality metrics, and best practice recommendations. A written personalized care plan for preventive services as well as general preventive health recommendations were provided to patient.     Sandrea Hammond, LPN   02/11/5630   Nurse Notes: None

## 2020-09-25 ENCOUNTER — Other Ambulatory Visit: Payer: Self-pay | Admitting: Family Medicine

## 2020-10-01 ENCOUNTER — Ambulatory Visit (INDEPENDENT_AMBULATORY_CARE_PROVIDER_SITE_OTHER): Payer: Medicare Other | Admitting: Licensed Clinical Social Worker

## 2020-10-01 ENCOUNTER — Other Ambulatory Visit: Payer: Self-pay | Admitting: Family Medicine

## 2020-10-01 DIAGNOSIS — J386 Stenosis of larynx: Secondary | ICD-10-CM | POA: Diagnosis not present

## 2020-10-01 DIAGNOSIS — F339 Major depressive disorder, recurrent, unspecified: Secondary | ICD-10-CM

## 2020-10-01 DIAGNOSIS — Z930 Tracheostomy status: Secondary | ICD-10-CM | POA: Diagnosis not present

## 2020-10-01 DIAGNOSIS — I10 Essential (primary) hypertension: Secondary | ICD-10-CM | POA: Diagnosis not present

## 2020-10-01 DIAGNOSIS — K219 Gastro-esophageal reflux disease without esophagitis: Secondary | ICD-10-CM

## 2020-10-01 DIAGNOSIS — F411 Generalized anxiety disorder: Secondary | ICD-10-CM

## 2020-10-01 DIAGNOSIS — Z430 Encounter for attention to tracheostomy: Secondary | ICD-10-CM

## 2020-10-01 NOTE — Patient Instructions (Signed)
Visit Information  PATIENT GOALS: Goals Addressed            This Visit's Progress   . manage anxiety issues of client       Timeframe:  Short-Term Goal Priority:  Medium Progress:  On Track Start Date:     10/01/20                        Expected End Date:    12/31/20                   Follow Up Date 11/09/20   Protect My Health (Patient)   Manage anxiety issues of client    Why is this important?    Screening tests can find diseases early when they are easier to treat.   Your doctor or nurse will talk with you about which tests are important for you.   Getting shots for common diseases like the flu and shingles will help prevent them.     Patient Self Care Activities:  . Self administers medications as prescribed . Attends all scheduled provider appointments . Performs ADL's independently  Patient Coping Strengths:  . Family . Friends . Spirituality . Hopefulness  Patient Self Care Deficits:  Marland Kitchen Mobility issues . Pain issues  Patient Goals:  - spend time or talk with others every day - practice relaxation or meditation daily - keep a calendar with appointment dates -talk regularly with spouse or with daughter about client needs  Follow Up Plan: [name] to call client on 11/09/20       [name].Alucard Fearnow MSW, [name] Licensed Clinical Social Worker [facility] Care Management [phone]

## 2020-10-01 NOTE — Chronic Care Management (AMB) (Signed)
Chronic Care Management    Clinical Social Work Note  10/01/2020 Name: [name] MRN: [id number] DOB: May 11, 1941  [name] is a 80 y.o. year old female who is a primary care patient of [name], [name], MD. The CCM team was consulted to assist the patient with chronic disease management and/or care coordination needs related to: [name] .   [name] was not able to engage with patient or her daughter today by telephone for follow up visit in response to provider referral for social work chronic care management and care coordination services.   Consent to Services:  The patient was given information about Chronic Care Management services, agreed to services, and gave verbal consent prior to initiation of services.  Please see initial visit note for detailed documentation.   Patient agreed to services and consent obtained.   Assessment: Review of patient past medical history, allergies, medications, and health status, including review of relevant consultants reports was performed today as part of a comprehensive evaluation and provision of chronic care management and care coordination services.     SDOH (Social Determinants of Health) assessments and interventions performed:    Advanced Directives Status: See Vynca application for related entries.  CCM Care Plan  Allergies  Allergen Reactions  . Lipitor [Atorvastatin Calcium] Other (See Comments)    myalgias  . Lyrica [Pregabalin] Other (See Comments)    SORES IN MOUTH   . Motrin [Ibuprofen] Other (See Comments)    Pt denies  . Sibutramine Hcl Monohydrate Other (See Comments)    UNSPECIFIED REACTION   . Alendronate Sodium Nausea Only  . Latex Rash and Other (See Comments)    RA to latex 1982; includes bandaids    . Levofloxacin Nausea And Vomiting  . Meloxicam Other (See Comments)    Vaginal itching     Outpatient Encounter Medications as of 10/01/2020  Medication Sig Note  . aspirin 81 MG chewable tablet  Chew 81 mg by mouth at bedtime.    . benzonatate (TESSALON) 200 MG capsule Take 1 capsule (200 mg total) by mouth 3 (three) times daily as needed.   . calcium carbonate (OS-CAL - DOSED IN MG OF ELEMENTAL CALCIUM) 1250 (500 Ca) MG tablet Take 1 tablet (500 mg of elemental calcium total) by mouth daily with breakfast.   . Cholecalciferol (VITAMIN D3) 25 MCG (1000 UT) CAPS 2 capsules   . clonazePAM (KLONOPIN) 0.25 MG disintegrating tablet DISSOLVE 1 TABLET BY MOUTH 3 TIMES A DAY   . furosemide (LASIX) 20 MG tablet Take 10 mg by mouth daily. 10 mg po  Mon/Wed/Fridays 09/24/2020: Taking QD  . hydrocortisone (ANUSOL-HC) 2.5 % rectal cream APPLY RECTALLY 2 TIMES A DAY AS NEEDED   . ketoconazole (NIZORAL) 2 % cream Apply topically daily.   Marland Kitchen latanoprost (XALATAN) 0.005 % ophthalmic solution Place 1 drop into both eyes at bedtime.   Marland Kitchen levothyroxine (SYNTHROID) 100 MCG tablet Take 100 mcg by mouth daily.   Marland Kitchen linaclotide (LINZESS) 72 MCG capsule Take 1 capsule (72 mcg total) by mouth daily before breakfast.   . loratadine (CLARITIN) 10 MG tablet TAKE 1 TABLET ONCE DAILY   . magnesium oxide (MAG-OX) 400 (241.3 Mg) MG tablet Take 1 tablet (400 mg total) by mouth daily. (Patient taking differently: Take 400 mg by mouth 2 (two) times daily.)   . Multiple Vitamin (MULTIVITAMIN WITH MINERALS) TABS tablet Take 1 tablet by mouth daily.   . potassium chloride (KLOR-CON) 10 MEQ tablet TAKE 2 TABLETS TWICE  A DAY   . QUEtiapine (SEROQUEL) 25 MG tablet TAKE 3 TABLETS AT BEDTIME AS DIRECTED   . simvastatin (ZOCOR) 80 MG tablet Take 1 tablet (80 mg total) by mouth daily.   Marland Kitchen triamcinolone (KENALOG) 0.1 % Apply topically 2 (two) times daily as needed.   . venlafaxine (EFFEXOR) 75 MG tablet Take 1 tablet (75 mg total) by mouth daily.    Facility-Administered Encounter Medications as of 10/01/2020  Medication  . cyanocobalamin ((VITAMIN B-12)) injection 1,000 mcg    Patient Active Problem List   Diagnosis Date Noted  .  Constipation 09/23/2020  . History of papillary adenocarcinoma of thyroid 09/23/2020  . Hypoparathyroidism ([place]) 09/23/2020  . Tracheostomy care ([place]) 09/23/2020  . Hypothyroidism 05/15/2020  . History of prosthetic aortic valve 10/15/2019  . BMI 28.0-28.9,adult 01/18/2019  . GAD (generalized anxiety disorder) 01/18/2019  . Pure hypercholesterolemia 01/18/2019  . Vitamin D deficiency 01/18/2019  . Tracheostomy dependence ([place]) 07/19/2018  . Bilateral vocal fold paralysis 07/19/2018  . Senile purpura ([place]) 02/02/2018  . Sensory disturbance 12/27/2017  . Posterior glottic stenosis 10/03/2017  . Diastolic CHF ([place])   . Weakness 08/12/2017  . PAF (paroxysmal atrial fibrillation) ([place])   . RVF (right ventricular failure) ([place])   . RVAD (right ventricular assist device) present ([place])   . S/P AVR 07/10/2017  . Vitamin B 12 deficiency 03/01/2016  . Ascending aortic aneurysm ([place]) 08/20/2015  . Thyroid cancer ([place]) 07/30/2015  . Osteoporosis with fracture 04/16/2014  . At high risk for falls 04/16/2014  . Proximal humerus fracture 05/30/2011  . Depression, recurrent ([place]) 02/23/2010  . GLAUCOMA 02/23/2010  . CATARACTS 02/23/2010  . RHEUMATIC FEVER 02/23/2010  . Essential hypertension 02/23/2010  . MITRAL VALVE PROLAPSE 02/23/2010  . GERD 02/23/2010  . Osteoarthritis 02/23/2010  . Primary fibromyalgia syndrome 02/23/2010    Conditions to be addressed/monitored: Monitor client management of anxiety issues   Care Plan : [name] care plan  Updates made by Katha Cabal, [name] since 10/01/2020 12:00 AM    Problem: Emotional Distress     Goal: Manage anxiety symptoms of client   Start Date: 10/01/2020  Expected End Date: 12/31/2020  This Visit's Progress: On track  Priority: Medium  Note:   Current Barriers:  . Chronic Mental Health needs related to anxiety management . Anxiety issues/Stress Challenges . Suicidal Ideation/Homicidal Ideation: No  Clinical Social Work Goal(s):   . patient will work with SW monthly by telephone or in person to reduce or manage symptoms related to anxiety and anxiety management . patient will work with SW in next 30 days to address concerns related to mobility of client and related to ADLs completion of client  Interventions: . 1:1 collaboration with [name], [name], MD regarding development and update of comprehensive plan of care as evidenced by provider attestation and co-signature . Chart review completed to assess client needs . [name] called client phone number today but [name] was not able to speak via phone today with client. [name] left phone message for client today asking her to please call [name] at [phone] . [name] also called phone number for [name], daughter of client . [name] was not able to speak via phone today with [name]; however, [name] did leave phone message for [name] asking her to please call [name] at [phone]  Patient Self Care Activities:  . Self administers medications as prescribed . Attends all scheduled provider appointments . Performs ADL's independently  Patient Coping Strengths:  . Family . Friends .  Spirituality . Hopefulness  Patient Self Care Deficits:  Marland Kitchen Mobility issues . Pain issues  Patient Goals:  - spend time or talk with others every day - practice relaxation or meditation daily - keep a calendar with appointment dates -talk regularly with spouse or with daughter about client needs  Follow Up Plan: [name] to call client on 11/09/20     [name].Cicero Noy MSW, [name] Licensed Clinical Social Worker [facility] Care Management [phone]

## 2020-10-15 ENCOUNTER — Other Ambulatory Visit: Payer: Self-pay | Admitting: Family Medicine

## 2020-10-15 DIAGNOSIS — F411 Generalized anxiety disorder: Secondary | ICD-10-CM

## 2020-10-15 DIAGNOSIS — F339 Major depressive disorder, recurrent, unspecified: Secondary | ICD-10-CM

## 2020-10-19 ENCOUNTER — Other Ambulatory Visit: Payer: Self-pay | Admitting: Family Medicine

## 2020-10-26 ENCOUNTER — Other Ambulatory Visit: Payer: Self-pay

## 2020-10-26 ENCOUNTER — Ambulatory Visit (INDEPENDENT_AMBULATORY_CARE_PROVIDER_SITE_OTHER): Payer: Medicare Other | Admitting: Family Medicine

## 2020-10-26 DIAGNOSIS — E538 Deficiency of other specified B group vitamins: Secondary | ICD-10-CM | POA: Diagnosis not present

## 2020-10-29 DIAGNOSIS — Z85850 Personal history of malignant neoplasm of thyroid: Secondary | ICD-10-CM | POA: Diagnosis not present

## 2020-10-29 DIAGNOSIS — E890 Postprocedural hypothyroidism: Secondary | ICD-10-CM | POA: Diagnosis not present

## 2020-10-29 DIAGNOSIS — R5383 Other fatigue: Secondary | ICD-10-CM | POA: Diagnosis not present

## 2020-10-29 DIAGNOSIS — Z430 Encounter for attention to tracheostomy: Secondary | ICD-10-CM | POA: Diagnosis not present

## 2020-10-30 ENCOUNTER — Other Ambulatory Visit: Payer: Self-pay

## 2020-10-30 ENCOUNTER — Emergency Department (HOSPITAL_COMMUNITY)
Admission: EM | Admit: 2020-10-30 | Discharge: 2020-10-30 | Disposition: A | Discharge status: Home / Self Care | Payer: Medicare Other | Attending: Emergency Medicine | Admitting: Emergency Medicine

## 2020-10-30 ENCOUNTER — Other Ambulatory Visit: Payer: Self-pay | Admitting: Family Medicine

## 2020-10-30 ENCOUNTER — Encounter (HOSPITAL_COMMUNITY): Payer: Self-pay | Admitting: *Deleted

## 2020-10-30 DIAGNOSIS — S81811A Laceration without foreign body, right lower leg, initial encounter: Secondary | ICD-10-CM | POA: Insufficient documentation

## 2020-10-30 DIAGNOSIS — I5032 Chronic diastolic (congestive) heart failure: Secondary | ICD-10-CM | POA: Diagnosis not present

## 2020-10-30 DIAGNOSIS — Z85850 Personal history of malignant neoplasm of thyroid: Secondary | ICD-10-CM | POA: Insufficient documentation

## 2020-10-30 DIAGNOSIS — Z96651 Presence of right artificial knee joint: Secondary | ICD-10-CM | POA: Insufficient documentation

## 2020-10-30 DIAGNOSIS — I110 Hypertensive heart disease with heart failure: Secondary | ICD-10-CM | POA: Insufficient documentation

## 2020-10-30 DIAGNOSIS — Z91040 Latex allergy status: Secondary | ICD-10-CM | POA: Insufficient documentation

## 2020-10-30 DIAGNOSIS — W208XXA Other cause of strike by thrown, projected or falling object, initial encounter: Secondary | ICD-10-CM | POA: Diagnosis not present

## 2020-10-30 DIAGNOSIS — S8991XA Unspecified injury of right lower leg, initial encounter: Secondary | ICD-10-CM | POA: Diagnosis present

## 2020-10-30 DIAGNOSIS — Z79899 Other long term (current) drug therapy: Secondary | ICD-10-CM | POA: Diagnosis not present

## 2020-10-30 DIAGNOSIS — Z7982 Long term (current) use of aspirin: Secondary | ICD-10-CM | POA: Diagnosis not present

## 2020-10-30 DIAGNOSIS — R58 Hemorrhage, not elsewhere classified: Secondary | ICD-10-CM | POA: Diagnosis not present

## 2020-10-30 DIAGNOSIS — R0689 Other abnormalities of breathing: Secondary | ICD-10-CM | POA: Diagnosis not present

## 2020-10-30 DIAGNOSIS — I959 Hypotension, unspecified: Secondary | ICD-10-CM | POA: Diagnosis not present

## 2020-10-30 DIAGNOSIS — J45909 Unspecified asthma, uncomplicated: Secondary | ICD-10-CM | POA: Insufficient documentation

## 2020-10-30 DIAGNOSIS — Y92512 Supermarket, store or market as the place of occurrence of the external cause: Secondary | ICD-10-CM | POA: Insufficient documentation

## 2020-10-30 DIAGNOSIS — R0902 Hypoxemia: Secondary | ICD-10-CM | POA: Diagnosis not present

## 2020-10-30 DIAGNOSIS — E039 Hypothyroidism, unspecified: Secondary | ICD-10-CM | POA: Insufficient documentation

## 2020-10-30 LAB — CBG MONITORING, ED: Glucose-Capillary: 91 mg/dL (ref 70–99)

## 2020-10-30 MED ORDER — LIDOCAINE-EPINEPHRINE (PF) 2 %-1:200000 IJ SOLN
10.0000 mL | Freq: Once | INTRAMUSCULAR | Status: AC
  Administered 2020-10-30: 10 mL
  Filled 2020-10-30: qty 10

## 2020-10-30 NOTE — ED Provider Notes (Signed)
  Face-to-face evaluation   History: Patient was accidentally hit on the right lower leg, by a large box fell off a shelf.  She was able ambulate afterwards, and has no other injuries.  She states her tetanus status is up-to-date.  She denies recent illnesses or change in medications.  Physical exam: Elderly, alert and cooperative.  Right lower leg, with large gaping laceration, distal anterolateral aspect.  Neurovascular intact distally.  Normal range of motion right leg  .Marland KitchenLaceration Repair  Date/Time: 10/30/2020 2:54 PM Performed by: Daleen Bo, MD Authorized by: Daleen Bo, MD   Consent:    Consent obtained:  Verbal   Consent given by:  Patient   Risks discussed:  Infection, pain, poor cosmetic result and need for additional repair Universal protocol:    Procedure explained and questions answered to patient or proxy's satisfaction: yes     Immediately prior to procedure, a time out was called: yes     Patient identity confirmed:  Verbally with patient Anesthesia:    Anesthesia method:  Local infiltration   Local anesthetic:  Lidocaine 1% w/o epi Laceration details:    Location:  Leg   Leg location:  R lower leg   Length (cm):  5.5   Depth (mm):  9 Exploration:    Limited defect created (wound extended): no     Hemostasis achieved with:  Direct pressure   Wound exploration: wound explored through full range of motion     Wound extent: no foreign bodies/material noted, no muscle damage noted, no nerve damage noted, no tendon damage noted, no underlying fracture noted and no vascular damage noted     Contaminated: no   Treatment:    Area cleansed with:  Povidone-iodine   Irrigation solution:  Sterile saline   Visualized foreign bodies/material removed: no     Debridement:  None Skin repair:    Repair method:  Sutures   Suture size:  3-0   Suture material:  Prolene   Suture technique:  Simple interrupted   Number of sutures:  4 Approximation:    Approximation:   Loose Repair type:    Repair type:  Simple Post-procedure details:    Dressing:  Non-adherent dressing   Procedure completion:  Tolerated well, no immediate complications    Medical screening examination/treatment/procedure(s) were conducted as a shared visit with non-physician practitioner(s) and myself.  I personally evaluated the patient during the encounter    Daleen Bo, MD 10/30/20 1745

## 2020-10-30 NOTE — ED Triage Notes (Signed)
Pt was at [facility] today and a box fell from a top shelf onto her right lower leg. EMS reports she had a 3 inch laceration. No blood thinner use. SBP initially 92 so EMS started IV and gave 2103ml NS bolus with SBP increasing to 125.

## 2020-10-30 NOTE — Telephone Encounter (Signed)
Last office visit 09/10/20 Last refill 10/01/20, #90, no refills

## 2020-10-30 NOTE — ED Provider Notes (Signed)
[facility] EMERGENCY DEPARTMENT Provider Note   CSN: [id number] Arrival date & time: 10/30/20  1248     History Chief Complaint  Patient presents with  . Laceration    [name] is a 80 y.o. female presents with a chief complaint of laceration.  Patient reports that she was at [facility] when a box fell onto her leg causing a laceration.  Patient was unable to control bleeding until EMS arrived and applied direct pressure and gauze.  Patient reports minimal pain to laceration site.  Patient rates pain 1/10 on the pain scale.  No alleviating or aggravating factors.  Patient denies any numbness or weakness to her extremities.  Patient denies being on any blood thinners.  Patient denies any pain.  Range of motion of right ankle and knee.  Patient reports that her last tetanus shot was within the last 5 years.  HPI     Past Medical History:  Diagnosis Date  . ACL tear    right  Dr. Gladstone Pih   . Adenomatous polyp   . Anxiety   . Arthritis    "all over" (04/12/2018)  . Ascending aortic aneurysm ([place])    note per chart per Dr Lucianne Lei Tright 4.7 cm 04/15/2015   . Asthma   . B12 deficiency   . Chronic bronchitis ([place])   . Depression   . DUB (dysfunctional uterine bleeding) 10/96  . Fibromyalgia   . GERD (gastroesophageal reflux disease)   . Glaucoma    bilaterally  . Helicobacter pylori (H. pylori)   . Hiatal hernia   . History of blood transfusion 06/2017   "related to [facility]"  . History of kidney stones   . History of left shoulder fracture    pt states fell off bed and broke ball in shoulder had rod placed   . Hyperlipidemia   . Hypertension   . Hypothyroidism   . Inspiratory stridor   . Laryngeal stenosis   . Occasional tremors    left arm   . Osteoarthritis   . Osteopenia   . Osteoporosis   . Thyroid cancer ([place]) 2017  . Thyroid nodule   . Tracheostomy in place [facility])     Patient Active Problem List   Diagnosis Date Noted  . Constipation 09/23/2020  . History  of papillary adenocarcinoma of thyroid 09/23/2020  . Hypoparathyroidism ([place]) 09/23/2020  . Tracheostomy care ([place]) 09/23/2020  . Hypothyroidism 05/15/2020  . History of prosthetic aortic valve 10/15/2019  . BMI 28.0-28.9,adult 01/18/2019  . GAD (generalized anxiety disorder) 01/18/2019  . Pure hypercholesterolemia 01/18/2019  . Vitamin D deficiency 01/18/2019  . Tracheostomy dependence ([place]) 07/19/2018  . Bilateral vocal fold paralysis 07/19/2018  . Senile purpura ([place]) 02/02/2018  . Sensory disturbance 12/27/2017  . Posterior glottic stenosis 10/03/2017  . Diastolic CHF ([place])   . Weakness 08/12/2017  . PAF (paroxysmal atrial fibrillation) ([place])   . RVF (right ventricular failure) ([place])   . RVAD (right ventricular assist device) present ([place])   . S/P AVR 07/10/2017  . Vitamin B 12 deficiency 03/01/2016  . Ascending aortic aneurysm ([place]) 08/20/2015  . Thyroid cancer ([place]) 07/30/2015  . Osteoporosis with fracture 04/16/2014  . At high risk for falls 04/16/2014  . Proximal humerus fracture 05/30/2011  . Depression, recurrent ([place]) 02/23/2010  . GLAUCOMA 02/23/2010  . CATARACTS 02/23/2010  . RHEUMATIC FEVER 02/23/2010  . Essential hypertension 02/23/2010  . MITRAL VALVE PROLAPSE 02/23/2010  . GERD 02/23/2010  . Osteoarthritis 02/23/2010  . Primary fibromyalgia  syndrome 02/23/2010    Past Surgical History:  Procedure Laterality Date  . AORTIC VALVE REPLACEMENT N/A 07/10/2017   Procedure: AORTIC VALVE REPLACEMENT (AVR);  Surgeon: [name], Collier Salina, MD;  Location: [place];  Service: Open Heart Surgery;  Laterality: N/A;  . APPENDECTOMY    . BUNIONECTOMY  08/1999   right - Dr. Irving Shows   . CARDIAC VALVE REPLACEMENT    . CATARACT EXTRACTION, BILATERAL Bilateral 05/28/07-2009   "right-left"  . CHOLECYSTECTOMY OPEN  1979  . DILATION AND CURETTAGE OF UTERUS  03/31/95   Dr. Ovid Curd   . DIRECT LARYNGOSCOPY WITH BOTOX INJECTION N/A 12/01/2017   Procedure: SUSPENDED DIRECT MICROLARYNGOSCOPY  WITH KENALOG INJECTION, CO2 LASER;  Surgeon: Melida Quitter, MD;  Location: [place];  Service: ENT;  Laterality: N/A;  . EXCISIONAL HEMORRHOIDECTOMY    . EXPLORATION POST OPERATIVE OPEN HEART N/A 07/11/2017   Procedure: EXPLORATION POST OPERATIVE OPEN HEART;  Surgeon: Ivin Poot, MD;  Location: [place];  Service: Open Heart Surgery;  Laterality: N/A;  . EYE SURGERY     also had left cataract removed   . FRACTURE SURGERY    . HEMATOMA EVACUATION N/A 07/11/2017   Procedure: EVACUATION HEMATOMA;  Surgeon: Ivin Poot, MD;  Location: [place];  Service: Open Heart Surgery;  Laterality: N/A;  . HERNIA REPAIR    . JOINT REPLACEMENT    . MICROLARYNGOSCOPY WITH LASER AND BALLOON DILATION N/A 01/24/2018   Procedure: MICROLARYNGOSCOPY WITH LASER AND BALLOON DILATION;  Surgeon: Melida Quitter, MD;  Location: [place];  Service: ENT;  Laterality: N/A;  . MICROLARYNGOSCOPY WITH LASER AND BALLOON DILATION N/A 04/13/2018   Procedure: MICROLARYNGOSCOPY WITH BALLOON DILATION;  Surgeon: Melida Quitter, MD;  Location: [facility];  Service: ENT;  Laterality: N/A;  . NEPHROLITHOTOMY Left 04/07/2017   Procedure: NEPHROLITHOTOMY PERCUTANEOUS WITH SURGEON ACCESS;  Surgeon: Ardis Hughs, MD;  [facility];  Service: Urology;  Laterality: Left;  . ORIF HUMERUS FRACTURE  05/30/2011   Procedure: OPEN REDUCTION INTERNAL FIXATION (ORIF) PROXIMAL HUMERUS FRACTURE;  Surgeon: Augustin Schooling;  Location: [place];  Service: Orthopedics;  Laterality: Left;  open reduction internal fixation of proximal humerus fracture  . PLACEMENT OF CENTRIMAG VENTRICULAR ASSIST DEVICE Right 07/11/2017   Procedure: PLACEMENT OF CENTRIMAG VENTRICULAR ASSIST DEVICE;  Surgeon: Ivin Poot, MD;  Location: [place];  Service: Open Heart Surgery;  Laterality: Right;  . REMOVAL OF CENTRIMAG VENTRICULAR ASSIST DEVICE N/A 07/17/2017   Procedure: REMOVAL OF RVAD WITH PUMP STANDBY;  Surgeon: Ivin Poot, MD;  Location: [place];  Service: Open Heart  Surgery;  Laterality: N/A;  . REPLACEMENT ASCENDING AORTA N/A 07/10/2017   Procedure: REPLACEMENT ASCENDING AORTA;  Surgeon: Ivin Poot, MD;  Location: [place];  Service: Open Heart Surgery;  Laterality: N/A;  . RIGHT/LEFT HEART CATH AND CORONARY ANGIOGRAPHY N/A 06/13/2017   Procedure: RIGHT/LEFT HEART CATH AND CORONARY ANGIOGRAPHY;  Surgeon: Jolaine Artist, MD;  Location: [place] [facility];  Service: Cardiovascular;  Laterality: N/A;  . TEE WITHOUT CARDIOVERSION N/A 07/10/2017   Procedure: TRANSESOPHAGEAL ECHOCARDIOGRAM (TEE);  Surgeon: [name], Collier Salina, MD;  Location: [place];  Service: Open Heart Surgery;  Laterality: N/A;  . TEE WITHOUT CARDIOVERSION  07/11/2017   Procedure: TRANSESOPHAGEAL ECHOCARDIOGRAM (TEE);  Surgeon: [name], Collier Salina, MD;  Location: [place];  Service: Open Heart Surgery;;  . TEE WITHOUT CARDIOVERSION N/A 07/17/2017   Procedure: TRANSESOPHAGEAL ECHOCARDIOGRAM (TEE);  Surgeon: [name], Collier Salina, MD;  Location: [place];  Service:  Open Heart Surgery;  Laterality: N/A;  . THYROID LOBECTOMY Left 07/27/2015   Procedure: LEFT THYROID LOBECTOMY;  Surgeon: Fanny Skates, MD;  [facility];  Service: General;  Laterality: Left;  . THYROIDECTOMY N/A 08/06/2015   Procedure: RIGHT THYROID LOBECTOMY, REIMPLANTATION PARATHYROID;  Surgeon: Fanny Skates, MD;  [facility];  Service: General;  Laterality: N/A;  . TOTAL KNEE ARTHROPLASTY Right 08/2009   Dr. Gladstone Pih  . TRACHEOSTOMY  08/08/2017  . VENTRAL HERNIA REPAIR  2/99     OB History   No obstetric history on file.     Family History  Problem Relation Age of Onset  . Cancer Mother        PANCREATIC   . Osteoporosis Mother   . Hip fracture Mother   . Heart attack Father 63       Died 68  . Heart disease Father   . Arthritis Father   . Hypertension Sister   . Cancer Sister        skin  . Hypertension Sister   . Cancer Sister        breast cancer  . Breast cancer Neg Hx     Social History   Tobacco Use   . Smoking status: Never Smoker  . Smokeless tobacco: Never Used  Vaping Use  . Vaping Use: Never used  Substance Use Topics  . Alcohol use: No  . Drug use: Never    Home Medications Prior to Admission medications   Medication Sig Start Date End Date Taking? Authorizing Provider  aspirin 81 MG chewable tablet Chew 81 mg by mouth at bedtime.     [provider]  benzonatate (TESSALON) 200 MG capsule Take 1 capsule (200 mg total) by mouth 3 (three) times daily as needed. 11/22/19   Sharion Balloon, FNP  calcium carbonate (OS-CAL - DOSED IN MG OF ELEMENTAL CALCIUM) 1250 (500 Ca) MG tablet Take 1 tablet (500 mg of elemental calcium total) by mouth daily with breakfast. 12/28/17   Orson Eva, MD  Cholecalciferol (VITAMIN D3) 25 MCG (1000 UT) CAPS 2 capsules    [provider]  clonazePAM (KLONOPIN) 0.25 MG disintegrating tablet DISSOLVE 1 TABLET BY MOUTH 3 TIMES A DAY 09/10/20   [name], [name], MD  furosemide (LASIX) 20 MG tablet Take 10 mg by mouth daily. 10 mg po  Mon/Wed/Fridays    [provider]  hydrocortisone (ANUSOL-HC) 2.5 % rectal cream APPLY RECTALLY 2 TIMES A DAY AS NEEDED 08/07/20   [name], [name], MD  ketoconazole (NIZORAL) 2 % cream Apply topically daily. 03/14/19   Baruch Gouty, FNP  latanoprost (XALATAN) 0.005 % ophthalmic solution Place 1 drop into both eyes at bedtime. 09/01/17   Angiulli, Lavon Paganini, PA-C  levothyroxine (SYNTHROID) 100 MCG tablet Take 100 mcg by mouth daily. 07/30/20   [provider]  linaclotide Rolan Lipa) 72 MCG capsule Take 1 capsule (72 mcg total) by mouth daily before breakfast. 04/11/19   [name], [name], MD  loratadine (CLARITIN) 10 MG tablet TAKE 1 TABLET ONCE DAILY 09/07/20   [name], [name], MD  magnesium oxide (MAG-OX) 400 (241.3 Mg) MG tablet Take 1 tablet (400 mg total) by mouth daily. Patient taking differently: Take 400 mg by mouth 2 (two) times daily. 12/28/17   Orson Eva, MD  Multiple Vitamin  (MULTIVITAMIN WITH MINERALS) TABS tablet Take 1 tablet by mouth daily.    [provider]  potassium chloride (KLOR-CON) 10 MEQ tablet TAKE 2 TABLETS TWICE A  DAY 10/19/20   [name], [name], MD  QUEtiapine (SEROQUEL) 25 MG tablet TAKE 3 TABLETS AT BEDTIME AS DIRECTED 10/30/20   [name], [name], MD  simvastatin (ZOCOR) 80 MG tablet Take 1 tablet (80 mg total) by mouth daily. 09/10/20   [name], [name], MD  triamcinolone (KENALOG) 0.1 % Apply topically 2 (two) times daily as needed. 07/27/20   [provider]  venlafaxine (EFFEXOR) 75 MG tablet Take 1 tablet (75 mg total) by mouth daily. 09/10/20   [name], [name], MD    Allergies    Lipitor [atorvastatin calcium], Lyrica [pregabalin], Motrin [ibuprofen], Sibutramine hcl monohydrate, Alendronate sodium, Latex, Levofloxacin, and Meloxicam  Review of Systems   Review of Systems  Musculoskeletal: Negative for arthralgias, back pain, joint swelling and neck pain.  Skin: Positive for wound. Negative for color change, pallor and rash.  Neurological: Negative for weakness and numbness.    Physical Exam Updated Vital Signs Ht 5\' 5"  (1.651 m)   Wt 84.4 kg   BMI 30.95 kg/m   Physical Exam Vitals and nursing note reviewed.  Constitutional:      General: She is not in acute distress.    Appearance: She is not ill-appearing, toxic-appearing or diaphoretic.  HENT:     Head: Normocephalic.  Eyes:     General: No scleral icterus.       Right eye: No discharge.        Left eye: No discharge.  Cardiovascular:     Rate and Rhythm: Normal rate.     Pulses:          Dorsalis pedis pulses are 2+ on the right side.  Pulmonary:     Effort: Pulmonary effort is normal.  Musculoskeletal:     Right lower leg: Laceration present. No swelling, deformity, tenderness or bony tenderness. No edema.     Right ankle: No swelling, deformity, ecchymosis or lacerations. No tenderness. Normal range of motion.     Right foot: Normal  range of motion and normal capillary refill. No swelling, deformity, laceration, tenderness or bony tenderness. Normal pulse.     Comments: 4 cm laceration noted to anterior aspect of patient's right lower leg, all hemorrhage controlled.  Feet:     Right foot:     Skin integrity: Skin integrity normal.     Toenail Condition: Right toenails are normal.  Skin:    General: Skin is warm and dry.  Neurological:     General: No focal deficit present.     Mental Status: She is alert.  Psychiatric:        Behavior: Behavior is cooperative.        ED Results / Procedures / Treatments   Labs (all labs ordered are listed, but only abnormal results are displayed) Labs Reviewed - No data to display  EKG None  Radiology No results found.  Procedures Procedures   Medications Ordered in ED Medications  lidocaine-EPINEPHrine (XYLOCAINE W/EPI) 2 %-1:200000 (PF) injection 10 mL (10 mLs Infiltration Given 10/30/20 1516)    ED Course  I have reviewed the triage vital signs and the nursing notes.  Pertinent labs & imaging results that were available during my care of the patient were reviewed by me and considered in my medical decision making (see chart for details).    MDM Rules/Calculators/A&P                          Alert 80 year old female no acute distress,  nontoxic-appearing.  Patient has a laceration to the inferior aspect of lower right leg.  Patient reports tetanus shot within the last 5 years.  Patient denies any numbness or weakness to affected extremity.  Patient has pulse, motor, sensation intact distally.    Tired at the wound was examined in a clear bloodless field.  No observed.  Suture procedure was performed by Dr. Eulis Foster.  Please see his note for procedure details.  4 sutures were placed.  We will have patient have sutures removed in 8 to 10 days.  Patient given strict return precautions.  Patient expressed understanding of all instructions and is agreeable with this  plan.  Final Clinical Impression(s) / ED Diagnoses Final diagnoses:  Laceration of right lower extremity, initial encounter    Rx / DC Orders ED Discharge Orders    None       Dyann Ruddle 10/30/20 1722    Daleen Bo, MD 10/30/20 [phone]

## 2020-10-30 NOTE — Discharge Instructions (Signed)
He came to the emerge department today to be evaluated for your leg laceration.  Your laceration required sutures.  You had 4 stitches placed.  Please keep wound covered and dry for the next 24 hours.  After this you may gently wash the area with soap and water.  Please do not submerge your wound under water.  Please follow-up with your primary care provider in 8 to 10 days to have your stitches removed.  Get help right away if: You develop severe swelling around the wound. Your pain suddenly increases and is severe. You develop painful lumps near the wound or on skin anywhere else on your body. You have a red streak going away from your wound. The wound is on your hand or foot and you cannot properly move a finger or toe. The wound is on your hand or foot, and you notice that your fingers or toes look pale or bluish.

## 2020-11-09 ENCOUNTER — Telehealth: Payer: Medicare Other

## 2020-11-09 DIAGNOSIS — H401111 Primary open-angle glaucoma, right eye, mild stage: Secondary | ICD-10-CM | POA: Diagnosis not present

## 2020-11-09 DIAGNOSIS — H40022 Open angle with borderline findings, high risk, left eye: Secondary | ICD-10-CM | POA: Diagnosis not present

## 2020-11-10 ENCOUNTER — Telehealth: Payer: Self-pay

## 2020-11-10 NOTE — Telephone Encounter (Signed)
Patient and husband walked in to office to have sutures removed.  There are no appointments available today so patient was scheduled for tomorrow.

## 2020-11-10 NOTE — Telephone Encounter (Signed)
Left message to call back  

## 2020-11-11 ENCOUNTER — Encounter: Payer: Self-pay | Admitting: Nurse Practitioner

## 2020-11-11 ENCOUNTER — Ambulatory Visit (INDEPENDENT_AMBULATORY_CARE_PROVIDER_SITE_OTHER): Payer: Medicare Other | Admitting: Nurse Practitioner

## 2020-11-11 ENCOUNTER — Other Ambulatory Visit: Payer: Self-pay

## 2020-11-11 VITALS — BP 127/51 | HR 89 | Temp 98.0°F | Ht >= 80 in | Wt 186.0 lb

## 2020-11-11 DIAGNOSIS — Z4802 Encounter for removal of sutures: Secondary | ICD-10-CM

## 2020-11-11 NOTE — Patient Instructions (Signed)
Follow up in 1 week   Wound Care, Adult Taking care of your wound properly can help to prevent pain, infection, and scarring. It can also help your wound heal more quickly. Follow instructions from your health care provider about how to care for your wound. Supplies needed:  Soap and water.  Wound cleanser.  Gauze.  If needed, a clean bandage (dressing) or other type of wound dressing material to cover or place in the wound. Follow your health care provider's instructions about what dressing supplies to use.  Cream or ointment to apply to the wound, if told by your health care provider. How to care for your wound Cleaning the wound Ask your health care provider how to clean the wound. This may include:  Using mild soap and water or a wound cleanser.  Using a clean gauze to pat the wound dry after cleaning it. Do not rub or scrub the wound. Dressing care  Wash your hands with soap and water for at least 20 seconds before and after you change the dressing. If soap and water are not available, use hand sanitizer.  Change your dressing as told by your health care provider. This may include: ? Cleaning or rinsing out (irrigating) the wound. ? Placing a dressing over the wound or in the wound (packing). ? Covering the wound with an outer dressing.  Leave any stitches (sutures), skin glue, or adhesive strips in place. These skin closures may need to stay in place for 2 weeks or longer. If adhesive strip edges start to loosen and curl up, you may trim the loose edges. Do not remove adhesive strips completely unless your health care provider tells you to do that.  Ask your health care provider when you can leave the wound uncovered. Checking for infection Check your wound area every day for signs of infection. Check for:  More redness, swelling, or pain.  Fluid or blood.  Warmth.  Pus or a bad smell.   Follow these instructions at home Medicines  If you were prescribed an  antibiotic medicine, cream, or ointment, take or apply it as told by your health care provider. Do not stop using the antibiotic even if your condition improves.  If you were prescribed pain medicine, take it 30 minutes before you do any wound care or as told by your health care provider.  Take over-the-counter and prescription medicines only as told by your health care provider. Eating and drinking  Eat a diet that includes protein, vitamin A, vitamin C, and other nutrient-rich foods to help the wound heal. ? Foods rich in protein include meat, fish, eggs, dairy, beans, and nuts. ? Foods rich in vitamin A include carrots and dark green, leafy vegetables. ? Foods rich in vitamin C include citrus fruits, tomatoes, broccoli, and peppers.  Drink enough fluid to keep your urine pale yellow. General instructions  Do not take baths, swim, use a hot tub, or do anything that would put the wound underwater until your health care provider approves. Ask your health care provider if you may take showers. You may only be allowed to take sponge baths.  Do not scratch or pick at the wound. Keep it covered as told by your health care provider.  Return to your normal activities as told by your health care provider. Ask your health care provider what activities are safe for you.  Protect your wound from the sun when you are outside for the first 6 months, or for as long as  told by your health care provider. Cover up the scar area or apply sunscreen that has an SPF of at least 61.  Do not use any products that contain nicotine or tobacco, such as cigarettes, e-cigarettes, and chewing tobacco. These may delay wound healing. If you need help quitting, ask your health care provider.  Keep all follow-up visits as told by your health care provider. This is important. Contact a health care provider if:  You received a tetanus shot and you have swelling, severe pain, redness, or bleeding at the injection  site.  Your pain is not controlled with medicine.  You have any of these signs of infection: ? More redness, swelling, or pain around the wound. ? Fluid or blood coming from the wound. ? Warmth coming from the wound. ? Pus or a bad smell coming from the wound. ? A fever or chills.  You are nauseous or you vomit.  You are dizzy. Get help right away if:  You have a red streak of skin near the area around your wound.  Your wound has been closed with staples, sutures, skin glue, or adhesive strips and it begins to open up and separate.  Your wound is bleeding, and the bleeding does not stop with gentle pressure.  You have a rash.  You faint.  You have trouble breathing. These symptoms may represent a serious problem that is an emergency. Do not wait to see if the symptoms will go away. Get medical help right away. Call your local emergency services (911 in the [place] Do not drive yourself to the hospital. Summary  Always wash your hands with soap and water for at least 20 seconds before and after changing your dressing.  Change your dressing as told by your health care provider.  To help with healing, eat foods that are rich in protein, vitamin A, vitamin C, and other nutrients.  Check your wound every day for signs of infection. Contact your health care provider if you suspect that your wound is infected. This information is not intended to replace advice given to you by your health care provider. Make sure you discuss any questions you have with your health care provider. Document Revised: 03/29/2019 Document Reviewed: 03/29/2019 Elsevier Patient Education  2021 [employer].

## 2020-11-11 NOTE — Assessment & Plan Note (Signed)
Sutures removed.  Patient tolerated procedure well.

## 2020-11-11 NOTE — Progress Notes (Signed)
Acute Office Visit  Subjective:    Patient ID: [name], female    DOB: 03/28/1941, 80 y.o.   MRN: [id number]  Chief Complaint  Patient presents with  . Suture / Staple Removal    HPI   Patient is in today for stitch removal.  No new concerns, signs and symptoms of wound infection.  No pain, fever or redness around incision site. Stitches removed patient tolerated well.    Past Medical History:  Diagnosis Date  . ACL tear    right  Dr. Gladstone Pih   . Adenomatous polyp   . Anxiety   . Arthritis    "all over" (04/12/2018)  . Ascending aortic aneurysm ([place])    note per chart per Dr Lucianne Lei Tright 4.7 cm 04/15/2015   . Asthma   . B12 deficiency   . Chronic bronchitis ([place])   . Depression   . DUB (dysfunctional uterine bleeding) 10/96  . Fibromyalgia   . GERD (gastroesophageal reflux disease)   . Glaucoma    bilaterally  . Helicobacter pylori (H. pylori)   . Hiatal hernia   . History of blood transfusion 06/2017   "related to OHS"  . History of kidney stones   . History of left shoulder fracture    pt states fell off bed and broke ball in shoulder had rod placed   . Hyperlipidemia   . Hypertension   . Hypothyroidism   . Inspiratory stridor   . Laryngeal stenosis   . Occasional tremors    left arm   . Osteoarthritis   . Osteopenia   . Osteoporosis   . Thyroid cancer ([place]) 2017  . Thyroid nodule   . Tracheostomy in place [facility])     Past Surgical History:  Procedure Laterality Date  . AORTIC VALVE REPLACEMENT N/A 07/10/2017   Procedure: AORTIC VALVE REPLACEMENT (AVR);  Surgeon: [name], Collier Salina, MD;  Location: [place];  Service: Open Heart Surgery;  Laterality: N/A;  . APPENDECTOMY    . BUNIONECTOMY  08/1999   right - Dr. Irving Shows   . CARDIAC VALVE REPLACEMENT    . CATARACT EXTRACTION, BILATERAL Bilateral 05/28/07-2009   "right-left"  . CHOLECYSTECTOMY OPEN  1979  . DILATION AND CURETTAGE OF UTERUS  03/31/95   Dr. Ovid Curd   . DIRECT LARYNGOSCOPY WITH BOTOX  INJECTION N/A 12/01/2017   Procedure: SUSPENDED DIRECT MICROLARYNGOSCOPY WITH KENALOG INJECTION, CO2 LASER;  Surgeon: Melida Quitter, MD;  Location: [place];  Service: ENT;  Laterality: N/A;  . EXCISIONAL HEMORRHOIDECTOMY    . EXPLORATION POST OPERATIVE OPEN HEART N/A 07/11/2017   Procedure: EXPLORATION POST OPERATIVE OPEN HEART;  Surgeon: Ivin Poot, MD;  Location: [place];  Service: Open Heart Surgery;  Laterality: N/A;  . EYE SURGERY     also had left cataract removed   . FRACTURE SURGERY    . HEMATOMA EVACUATION N/A 07/11/2017   Procedure: EVACUATION HEMATOMA;  Surgeon: Ivin Poot, MD;  Location: [place];  Service: Open Heart Surgery;  Laterality: N/A;  . HERNIA REPAIR    . JOINT REPLACEMENT    . MICROLARYNGOSCOPY WITH LASER AND BALLOON DILATION N/A 01/24/2018   Procedure: MICROLARYNGOSCOPY WITH LASER AND BALLOON DILATION;  Surgeon: Melida Quitter, MD;  Location: [place];  Service: ENT;  Laterality: N/A;  . MICROLARYNGOSCOPY WITH LASER AND BALLOON DILATION N/A 04/13/2018   Procedure: MICROLARYNGOSCOPY WITH BALLOON DILATION;  Surgeon: Melida Quitter, MD;  Location: [place];  Service: ENT;  Laterality: N/A;  . NEPHROLITHOTOMY Left  04/07/2017   Procedure: NEPHROLITHOTOMY PERCUTANEOUS WITH SURGEON ACCESS;  Surgeon: Ardis Hughs, MD;  Location: [facility];  Service: Urology;  Laterality: Left;  . ORIF HUMERUS FRACTURE  05/30/2011   Procedure: OPEN REDUCTION INTERNAL FIXATION (ORIF) PROXIMAL HUMERUS FRACTURE;  Surgeon: Augustin Schooling;  Location: [place];  Service: Orthopedics;  Laterality: Left;  open reduction internal fixation of proximal humerus fracture  . PLACEMENT OF CENTRIMAG VENTRICULAR ASSIST DEVICE Right 07/11/2017   Procedure: PLACEMENT OF CENTRIMAG VENTRICULAR ASSIST DEVICE;  Surgeon: Ivin Poot, MD;  Location: [place];  Service: Open Heart Surgery;  Laterality: Right;  . REMOVAL OF CENTRIMAG VENTRICULAR ASSIST DEVICE N/A 07/17/2017   Procedure: REMOVAL OF RVAD WITH PUMP STANDBY;   Surgeon: Ivin Poot, MD;  Location: [place];  Service: Open Heart Surgery;  Laterality: N/A;  . REPLACEMENT ASCENDING AORTA N/A 07/10/2017   Procedure: REPLACEMENT ASCENDING AORTA;  Surgeon: Ivin Poot, MD;  Location: [place];  Service: Open Heart Surgery;  Laterality: N/A;  . RIGHT/LEFT HEART CATH AND CORONARY ANGIOGRAPHY N/A 06/13/2017   Procedure: RIGHT/LEFT HEART CATH AND CORONARY ANGIOGRAPHY;  Surgeon: Jolaine Artist, MD;  Location: [place] [facility];  Service: Cardiovascular;  Laterality: N/A;  . TEE WITHOUT CARDIOVERSION N/A 07/10/2017   Procedure: TRANSESOPHAGEAL ECHOCARDIOGRAM (TEE);  Surgeon: [name], Collier Salina, MD;  Location: [place];  Service: Open Heart Surgery;  Laterality: N/A;  . TEE WITHOUT CARDIOVERSION  07/11/2017   Procedure: TRANSESOPHAGEAL ECHOCARDIOGRAM (TEE);  Surgeon: [name], Collier Salina, MD;  Location: [place];  Service: Open Heart Surgery;;  . TEE WITHOUT CARDIOVERSION N/A 07/17/2017   Procedure: TRANSESOPHAGEAL ECHOCARDIOGRAM (TEE);  Surgeon: [name], Collier Salina, MD;  Location: [place];  Service: Open Heart Surgery;  Laterality: N/A;  . THYROID LOBECTOMY Left 07/27/2015   Procedure: LEFT THYROID LOBECTOMY;  Surgeon: Fanny Skates, MD;  [facility];  Service: General;  Laterality: Left;  . THYROIDECTOMY N/A 08/06/2015   Procedure: RIGHT THYROID LOBECTOMY, REIMPLANTATION PARATHYROID;  Surgeon: Fanny Skates, MD;  [facility];  Service: General;  Laterality: N/A;  . TOTAL KNEE ARTHROPLASTY Right 08/2009   Dr. Gladstone Pih  . TRACHEOSTOMY  08/08/2017  . VENTRAL HERNIA REPAIR  2/99    Family History  Problem Relation Age of Onset  . Cancer Mother        PANCREATIC   . Osteoporosis Mother   . Hip fracture Mother   . Heart attack Father 63       Died 68  . Heart disease Father   . Arthritis Father   . Hypertension Sister   . Cancer Sister        skin  . Hypertension Sister   . Cancer Sister        breast cancer  . Breast cancer Neg Hx     Social History    Socioeconomic History  . Marital status: Married    Spouse name: Laverna Peace  . Number of children: 1  . Years of education: 97  . Highest education level: 11th grade  Occupational History  . Occupation: homemaker  Tobacco Use  . Smoking status: Never Smoker  . Smokeless tobacco: Never Used  Vaping Use  . Vaping Use: Never used  Substance and Sexual Activity  . Alcohol use: No  . Drug use: Never  . Sexual activity: Not Currently    Birth control/protection: Post-menopausal  Other Topics Concern  . Not on file  Social History Narrative   Lives with husband.    Social  Determinants of Health   Financial Resource Strain: Low Risk   . Difficulty of Paying Living Expenses: Not hard at all  Food Insecurity: No Food Insecurity  . Worried About Charity fundraiser in the Last Year: Never true  . Ran Out of Food in the Last Year: Never true  Transportation Needs: No Transportation Needs  . Lack of Transportation (Medical): No  . Lack of Transportation (Non-Medical): No  Physical Activity: Insufficiently Active  . Days of Exercise per Week: 3 days  . Minutes of Exercise per Session: 20 min  Stress: Stress Concern Present  . Feeling of Stress : To some extent  Social Connections: Moderately Isolated  . Frequency of Communication with Friends and Family: More than three times a week  . Frequency of Social Gatherings with Friends and Family: Three times a week  . Attends Religious Services: Never  . Active Member of Clubs or Organizations: No  . Attends Archivist Meetings: Never  . Marital Status: Married  Human resources officer Violence: Not At Risk  . Fear of Current or Ex-Partner: No  . Emotionally Abused: No  . Physically Abused: No  . Sexually Abused: No    Outpatient Medications Prior to Visit  Medication Sig Dispense Refill  . aspirin 81 MG chewable tablet Chew 81 mg by mouth at bedtime.     . benzonatate (TESSALON) 200 MG capsule Take 1 capsule (200 mg total) by  mouth 3 (three) times daily as needed. 30 capsule 1  . calcium carbonate (OS-CAL - DOSED IN MG OF ELEMENTAL CALCIUM) 1250 (500 Ca) MG tablet Take 1 tablet (500 mg of elemental calcium total) by mouth daily with breakfast. 30 tablet 0  . Cholecalciferol (VITAMIN D3) 25 MCG (1000 UT) CAPS 2 capsules    . clonazePAM (KLONOPIN) 0.25 MG disintegrating tablet DISSOLVE 1 TABLET BY MOUTH 3 TIMES A DAY 90 tablet 2  . furosemide (LASIX) 20 MG tablet Take 10 mg by mouth daily. 10 mg po  Mon/Wed/Fridays    . hydrocortisone (ANUSOL-HC) 2.5 % rectal cream APPLY RECTALLY 2 TIMES A DAY AS NEEDED 30 g 0  . ketoconazole (NIZORAL) 2 % cream Apply topically daily. 15 g 0  . latanoprost (XALATAN) 0.005 % ophthalmic solution Place 1 drop into both eyes at bedtime. 2.5 mL 12  . levothyroxine (SYNTHROID) 100 MCG tablet Take 100 mcg by mouth daily.    Marland Kitchen linaclotide (LINZESS) 72 MCG capsule Take 1 capsule (72 mcg total) by mouth daily before breakfast. 30 capsule 5  . loratadine (CLARITIN) 10 MG tablet TAKE 1 TABLET ONCE DAILY 30 tablet 9  . magnesium oxide (MAG-OX) 400 (241.3 Mg) MG tablet Take 1 tablet (400 mg total) by mouth daily. (Patient taking differently: Take 400 mg by mouth 2 (two) times daily.) 30 tablet 0  . Multiple Vitamin (MULTIVITAMIN WITH MINERALS) TABS tablet Take 1 tablet by mouth daily.    . potassium chloride (KLOR-CON) 10 MEQ tablet TAKE 2 TABLETS TWICE A DAY 120 tablet 1  . QUEtiapine (SEROQUEL) 25 MG tablet TAKE 3 TABLETS AT BEDTIME AS DIRECTED 90 tablet 0  . simvastatin (ZOCOR) 80 MG tablet Take 1 tablet (80 mg total) by mouth daily. 90 tablet 3  . triamcinolone (KENALOG) 0.1 % Apply topically 2 (two) times daily as needed.    . venlafaxine (EFFEXOR) 75 MG tablet Take 1 tablet (75 mg total) by mouth daily. 90 tablet 3   Facility-Administered Medications Prior to Visit  Medication Dose Route Frequency Provider  Last Rate Last Admin  . cyanocobalamin ((VITAMIN B-12)) injection 1,000 mcg  1,000 mcg  Intramuscular Q30 days [name], [name], MD   1,000 mcg at 10/26/20 1125    Allergies  Allergen Reactions  . Lipitor [Atorvastatin Calcium] Other (See Comments)    myalgias  . Lyrica [Pregabalin] Other (See Comments)    SORES IN MOUTH   . Motrin [Ibuprofen] Other (See Comments)    Pt denies  . Sibutramine Hcl Monohydrate Other (See Comments)    UNSPECIFIED REACTION   . Alendronate Sodium Nausea Only  . Latex Rash and Other (See Comments)    RA to latex 1982; includes bandaids    . Levofloxacin Nausea And Vomiting  . Meloxicam Other (See Comments)    Vaginal itching     Review of Systems  HENT: Negative.   Respiratory: Negative.   Cardiovascular: Negative.   Genitourinary: Negative.   Musculoskeletal: Negative.   Skin: Positive for wound.  All other systems reviewed and are negative.      Objective:    Physical Exam Vitals and nursing note reviewed.  Constitutional:      Appearance: Normal appearance.  HENT:     Head: Normocephalic.     Nose: Nose normal.     Mouth/Throat:     Mouth: Mucous membranes are moist.  Eyes:     Conjunctiva/sclera: Conjunctivae normal.  Cardiovascular:     Pulses: Normal pulses.     Heart sounds: Normal heart sounds.  Pulmonary:     Effort: Pulmonary effort is normal.     Breath sounds: Normal breath sounds.  Abdominal:     General: Bowel sounds are normal.  Skin:    General: Skin is warm.     Findings: No erythema or rash.  Neurological:     Mental Status: She is alert and oriented to person, place, and time.  Psychiatric:        Behavior: Behavior normal.     BP (!) 127/51   Pulse 89   Temp 98 F (36.7 C) (Temporal)   Ht 7' (2.134 m)   Wt 186 lb (84.4 kg)   SpO2 95%   BMI 18.53 kg/m  Wt Readings from Last 3 Encounters:  11/11/20 186 lb (84.4 kg)  10/30/20 186 lb (84.4 kg)  09/24/20 187 lb (84.8 kg)    There are no preventive care reminders to display for this patient.  There are no preventive care  reminders to display for this patient.   Lab Results  Component Value Date   TSH 0.326 (L) 09/10/2020   Lab Results  Component Value Date   WBC 6.2 09/10/2020   HGB 12.2 09/10/2020   HCT 36.8 09/10/2020   MCV 88 09/10/2020   PLT 211 09/10/2020   Lab Results  Component Value Date   NA 142 09/10/2020   K 4.3 09/10/2020   CO2 23 09/10/2020   GLUCOSE 85 09/10/2020   BUN 12 09/10/2020   CREATININE 1.13 (H) 09/10/2020   BILITOT 0.4 09/10/2020   ALKPHOS 123 (H) 09/10/2020   AST 24 09/10/2020   ALT 12 09/10/2020   PROT 6.4 09/10/2020   ALBUMIN 4.4 09/10/2020   CALCIUM 8.7 09/10/2020   ANIONGAP 7 04/13/2018   EGFR 50 (L) 09/10/2020   Lab Results  Component Value Date   CHOL 138 09/10/2020   Lab Results  Component Value Date   HDL 64 09/10/2020   Lab Results  Component Value Date   LDLCALC 56 09/10/2020   Lab  Results  Component Value Date   TRIG 95 09/10/2020   Lab Results  Component Value Date   CHOLHDL 2.2 09/10/2020   Lab Results  Component Value Date   HGBA1C 5.2 07/06/2017       Assessment & Plan:   Problem List Items Addressed This Visit      Other   Encounter for removal of sutures - Primary    Sutures removed.  Patient tolerated procedure well.          No orders of the defined types were placed in this encounter.    Ivy Lynn, NP

## 2020-11-17 ENCOUNTER — Other Ambulatory Visit: Payer: Self-pay

## 2020-11-17 ENCOUNTER — Ambulatory Visit (INDEPENDENT_AMBULATORY_CARE_PROVIDER_SITE_OTHER): Payer: Medicare Other | Admitting: Nurse Practitioner

## 2020-11-17 ENCOUNTER — Encounter: Payer: Self-pay | Admitting: Nurse Practitioner

## 2020-11-17 VITALS — BP 133/76 | HR 76 | Temp 97.4°F | Ht >= 80 in | Wt 186.0 lb

## 2020-11-17 DIAGNOSIS — Z4802 Encounter for removal of sutures: Secondary | ICD-10-CM | POA: Diagnosis not present

## 2020-11-17 NOTE — Assessment & Plan Note (Signed)
Following up 1 week after suture removal.  Skin is healing as expected.  Provided education to patient tomorrow to watch out for signs and symptoms .  Patient verbalized understanding.  No pain  Advised patient to follow-up with worsening unresolved symptoms.

## 2020-11-17 NOTE — Patient Instructions (Signed)
Wound Care, Adult Taking care of your wound properly can help to prevent pain, infection, and scarring. It can also help your wound heal more quickly. Follow instructions from your health care provider about how to care for your wound. Supplies needed:  Soap and water.  Wound cleanser.  Gauze.  If needed, a clean bandage (dressing) or other type of wound dressing material to cover or place in the wound. Follow your health care provider's instructions about what dressing supplies to use.  Cream or ointment to apply to the wound, if told by your health care provider. How to care for your wound Cleaning the wound Ask your health care provider how to clean the wound. This may include:  Using mild soap and water or a wound cleanser.  Using a clean gauze to pat the wound dry after cleaning it. Do not rub or scrub the wound. Dressing care  Wash your hands with soap and water for at least 20 seconds before and after you change the dressing. If soap and water are not available, use hand sanitizer.  Change your dressing as told by your health care provider. This may include: ? Cleaning or rinsing out (irrigating) the wound. ? Placing a dressing over the wound or in the wound (packing). ? Covering the wound with an outer dressing.  Leave any stitches (sutures), skin glue, or adhesive strips in place. These skin closures may need to stay in place for 2 weeks or longer. If adhesive strip edges start to loosen and curl up, you may trim the loose edges. Do not remove adhesive strips completely unless your health care provider tells you to do that.  Ask your health care provider when you can leave the wound uncovered. Checking for infection Check your wound area every day for signs of infection. Check for:  More redness, swelling, or pain.  Fluid or blood.  Warmth.  Pus or a bad smell.   Follow these instructions at home Medicines  If you were prescribed an antibiotic medicine, cream, or  ointment, take or apply it as told by your health care provider. Do not stop using the antibiotic even if your condition improves.  If you were prescribed pain medicine, take it 30 minutes before you do any wound care or as told by your health care provider.  Take over-the-counter and prescription medicines only as told by your health care provider. Eating and drinking  Eat a diet that includes protein, vitamin A, vitamin C, and other nutrient-rich foods to help the wound heal. ? Foods rich in protein include meat, fish, eggs, dairy, beans, and nuts. ? Foods rich in vitamin A include carrots and dark green, leafy vegetables. ? Foods rich in vitamin C include citrus fruits, tomatoes, broccoli, and peppers.  Drink enough fluid to keep your urine pale yellow. General instructions  Do not take baths, swim, use a hot tub, or do anything that would put the wound underwater until your health care provider approves. Ask your health care provider if you may take showers. You may only be allowed to take sponge baths.  Do not scratch or pick at the wound. Keep it covered as told by your health care provider.  Return to your normal activities as told by your health care provider. Ask your health care provider what activities are safe for you.  Protect your wound from the sun when you are outside for the first 6 months, or for as long as told by your health care provider. Cover   up the scar area or apply sunscreen that has an SPF of at least 30.  Do not use any products that contain nicotine or tobacco, such as cigarettes, e-cigarettes, and chewing tobacco. These may delay wound healing. If you need help quitting, ask your health care provider.  Keep all follow-up visits as told by your health care provider. This is important. Contact a health care provider if:  You received a tetanus shot and you have swelling, severe pain, redness, or bleeding at the injection site.  Your pain is not controlled  with medicine.  You have any of these signs of infection: ? More redness, swelling, or pain around the wound. ? Fluid or blood coming from the wound. ? Warmth coming from the wound. ? Pus or a bad smell coming from the wound. ? A fever or chills.  You are nauseous or you vomit.  You are dizzy. Get help right away if:  You have a red streak of skin near the area around your wound.  Your wound has been closed with staples, sutures, skin glue, or adhesive strips and it begins to open up and separate.  Your wound is bleeding, and the bleeding does not stop with gentle pressure.  You have a rash.  You faint.  You have trouble breathing. These symptoms may represent a serious problem that is an emergency. Do not wait to see if the symptoms will go away. Get medical help right away. Call your local emergency services (911 in the [place] Do not drive yourself to the hospital. Summary  Always wash your hands with soap and water for at least 20 seconds before and after changing your dressing.  Change your dressing as told by your health care provider.  To help with healing, eat foods that are rich in protein, vitamin A, vitamin C, and other nutrients.  Check your wound every day for signs of infection. Contact your health care provider if you suspect that your wound is infected. This information is not intended to replace advice given to you by your health care provider. Make sure you discuss any questions you have with your health care provider. Document Revised: 03/29/2019 Document Reviewed: 03/29/2019 Elsevier Patient Education  2021 [employer]  

## 2020-11-17 NOTE — Progress Notes (Signed)
Acute Office Visit  Subjective:    Patient ID: [name], female    DOB: 06-28-1940, 80 y.o.   MRN: [id number]  Chief Complaint  Patient presents with  . Extremity Laceration    HPI Patient is in today for Wound Check: Patient presents for wound check. Patient has a skin tear wound which is located on the right foot. Current symptoms: wound healing as expected. Symptoms began a few days ago. Pain is rated 0/10. Interventions to date: sutures removed 7 days ago. Healing as expected. No sign / symptom of infection.  Past Medical History:  Diagnosis Date  . ACL tear    right  Dr. Gladstone Pih   . Adenomatous polyp   . Anxiety   . Arthritis    "all over" (04/12/2018)  . Ascending aortic aneurysm ([place])    note per chart per Dr Lucianne Lei Tright 4.7 cm 04/15/2015   . Asthma   . B12 deficiency   . Chronic bronchitis ([place])   . Depression   . DUB (dysfunctional uterine bleeding) 10/96  . Fibromyalgia   . GERD (gastroesophageal reflux disease)   . Glaucoma    bilaterally  . Helicobacter pylori (H. pylori)   . Hiatal hernia   . History of blood transfusion 06/2017   "related to OHS"  . History of kidney stones   . History of left shoulder fracture    pt states fell off bed and broke ball in shoulder had rod placed   . Hyperlipidemia   . Hypertension   . Hypothyroidism   . Inspiratory stridor   . Laryngeal stenosis   . Occasional tremors    left arm   . Osteoarthritis   . Osteopenia   . Osteoporosis   . Thyroid cancer ([place]) 2017  . Thyroid nodule   . Tracheostomy in place [facility])     Past Surgical History:  Procedure Laterality Date  . AORTIC VALVE REPLACEMENT N/A 07/10/2017   Procedure: AORTIC VALVE REPLACEMENT (AVR);  Surgeon: [name], Collier Salina, MD;  Location: [place];  Service: Open Heart Surgery;  Laterality: N/A;  . APPENDECTOMY    . BUNIONECTOMY  08/1999   right - Dr. Irving Shows   . CARDIAC VALVE REPLACEMENT    . CATARACT EXTRACTION, BILATERAL Bilateral 05/28/07-2009    "right-left"  . CHOLECYSTECTOMY OPEN  1979  . DILATION AND CURETTAGE OF UTERUS  03/31/95   Dr. Ovid Curd   . DIRECT LARYNGOSCOPY WITH BOTOX INJECTION N/A 12/01/2017   Procedure: SUSPENDED DIRECT MICROLARYNGOSCOPY WITH KENALOG INJECTION, CO2 LASER;  Surgeon: Melida Quitter, MD;  Location: [place];  Service: ENT;  Laterality: N/A;  . EXCISIONAL HEMORRHOIDECTOMY    . EXPLORATION POST OPERATIVE OPEN HEART N/A 07/11/2017   Procedure: EXPLORATION POST OPERATIVE OPEN HEART;  Surgeon: Ivin Poot, MD;  Location: [place];  Service: Open Heart Surgery;  Laterality: N/A;  . EYE SURGERY     also had left cataract removed   . FRACTURE SURGERY    . HEMATOMA EVACUATION N/A 07/11/2017   Procedure: EVACUATION HEMATOMA;  Surgeon: Ivin Poot, MD;  Location: [place];  Service: Open Heart Surgery;  Laterality: N/A;  . HERNIA REPAIR    . JOINT REPLACEMENT    . MICROLARYNGOSCOPY WITH LASER AND BALLOON DILATION N/A 01/24/2018   Procedure: MICROLARYNGOSCOPY WITH LASER AND BALLOON DILATION;  Surgeon: Melida Quitter, MD;  Location: [place];  Service: ENT;  Laterality: N/A;  . MICROLARYNGOSCOPY WITH LASER AND BALLOON DILATION N/A 04/13/2018   Procedure: MICROLARYNGOSCOPY WITH  BALLOON DILATION;  Surgeon: Melida Quitter, MD;  Location: [facility];  Service: ENT;  Laterality: N/A;  . NEPHROLITHOTOMY Left 04/07/2017   Procedure: NEPHROLITHOTOMY PERCUTANEOUS WITH SURGEON ACCESS;  Surgeon: Ardis Hughs, MD;  [facility];  Service: Urology;  Laterality: Left;  . ORIF HUMERUS FRACTURE  05/30/2011   Procedure: OPEN REDUCTION INTERNAL FIXATION (ORIF) PROXIMAL HUMERUS FRACTURE;  Surgeon: Augustin Schooling;  Location: [place];  Service: Orthopedics;  Laterality: Left;  open reduction internal fixation of proximal humerus fracture  . PLACEMENT OF CENTRIMAG VENTRICULAR ASSIST DEVICE Right 07/11/2017   Procedure: PLACEMENT OF CENTRIMAG VENTRICULAR ASSIST DEVICE;  Surgeon: Ivin Poot, MD;  Location: [place];  Service: Open Heart Surgery;   Laterality: Right;  . REMOVAL OF CENTRIMAG VENTRICULAR ASSIST DEVICE N/A 07/17/2017   Procedure: REMOVAL OF RVAD WITH PUMP STANDBY;  Surgeon: Ivin Poot, MD;  Location: [place];  Service: Open Heart Surgery;  Laterality: N/A;  . REPLACEMENT ASCENDING AORTA N/A 07/10/2017   Procedure: REPLACEMENT ASCENDING AORTA;  Surgeon: Ivin Poot, MD;  Location: [place];  Service: Open Heart Surgery;  Laterality: N/A;  . RIGHT/LEFT HEART CATH AND CORONARY ANGIOGRAPHY N/A 06/13/2017   Procedure: RIGHT/LEFT HEART CATH AND CORONARY ANGIOGRAPHY;  Surgeon: Jolaine Artist, MD;  Location: [place] [facility];  Service: Cardiovascular;  Laterality: N/A;  . TEE WITHOUT CARDIOVERSION N/A 07/10/2017   Procedure: TRANSESOPHAGEAL ECHOCARDIOGRAM (TEE);  Surgeon: [name], Collier Salina, MD;  Location: [place];  Service: Open Heart Surgery;  Laterality: N/A;  . TEE WITHOUT CARDIOVERSION  07/11/2017   Procedure: TRANSESOPHAGEAL ECHOCARDIOGRAM (TEE);  Surgeon: [name], Collier Salina, MD;  Location: [place];  Service: Open Heart Surgery;;  . TEE WITHOUT CARDIOVERSION N/A 07/17/2017   Procedure: TRANSESOPHAGEAL ECHOCARDIOGRAM (TEE);  Surgeon: [name], Collier Salina, MD;  Location: [place];  Service: Open Heart Surgery;  Laterality: N/A;  . THYROID LOBECTOMY Left 07/27/2015   Procedure: LEFT THYROID LOBECTOMY;  Surgeon: Fanny Skates, MD;  [facility];  Service: General;  Laterality: Left;  . THYROIDECTOMY N/A 08/06/2015   Procedure: RIGHT THYROID LOBECTOMY, REIMPLANTATION PARATHYROID;  Surgeon: Fanny Skates, MD;  [facility];  Service: General;  Laterality: N/A;  . TOTAL KNEE ARTHROPLASTY Right 08/2009   Dr. Gladstone Pih  . TRACHEOSTOMY  08/08/2017  . VENTRAL HERNIA REPAIR  2/99    Family History  Problem Relation Age of Onset  . Cancer Mother        PANCREATIC   . Osteoporosis Mother   . Hip fracture Mother   . Heart attack Father 63       Died 68  . Heart disease Father   . Arthritis Father   . Hypertension Sister   .  Cancer Sister        skin  . Hypertension Sister   . Cancer Sister        breast cancer  . Breast cancer Neg Hx     Social History   Socioeconomic History  . Marital status: Married    Spouse name: Laverna Peace  . Number of children: 1  . Years of education: 49  . Highest education level: 11th grade  Occupational History  . Occupation: homemaker  Tobacco Use  . Smoking status: Never Smoker  . Smokeless tobacco: Never Used  Vaping Use  . Vaping Use: Never used  Substance and Sexual Activity  . Alcohol use: No  . Drug use: Never  . Sexual activity: Not Currently    Birth control/protection: Post-menopausal  Other Topics Concern  . Not on file  Social History Narrative   Lives with husband.    Social Determinants of Health   Financial Resource Strain: Low Risk   . Difficulty of Paying Living Expenses: Not hard at all  Food Insecurity: No Food Insecurity  . Worried About Charity fundraiser in the Last Year: Never true  . Ran Out of Food in the Last Year: Never true  Transportation Needs: No Transportation Needs  . Lack of Transportation (Medical): No  . Lack of Transportation (Non-Medical): No  Physical Activity: Insufficiently Active  . Days of Exercise per Week: 3 days  . Minutes of Exercise per Session: 20 min  Stress: Stress Concern Present  . Feeling of Stress : To some extent  Social Connections: Moderately Isolated  . Frequency of Communication with Friends and Family: More than three times a week  . Frequency of Social Gatherings with Friends and Family: Three times a week  . Attends Religious Services: Never  . Active Member of Clubs or Organizations: No  . Attends Archivist Meetings: Never  . Marital Status: Married  Human resources officer Violence: Not At Risk  . Fear of Current or Ex-Partner: No  . Emotionally Abused: No  . Physically Abused: No  . Sexually Abused: No    Outpatient Medications Prior to Visit  Medication Sig Dispense Refill  .  aspirin 81 MG chewable tablet Chew 81 mg by mouth at bedtime.     . benzonatate (TESSALON) 200 MG capsule Take 1 capsule (200 mg total) by mouth 3 (three) times daily as needed. 30 capsule 1  . calcium carbonate (OS-CAL - DOSED IN MG OF ELEMENTAL CALCIUM) 1250 (500 Ca) MG tablet Take 1 tablet (500 mg of elemental calcium total) by mouth daily with breakfast. 30 tablet 0  . Cholecalciferol (VITAMIN D3) 25 MCG (1000 UT) CAPS 2 capsules    . clonazePAM (KLONOPIN) 0.25 MG disintegrating tablet DISSOLVE 1 TABLET BY MOUTH 3 TIMES A DAY 90 tablet 2  . furosemide (LASIX) 20 MG tablet Take 10 mg by mouth daily. 10 mg po  Mon/Wed/Fridays    . hydrocortisone (ANUSOL-HC) 2.5 % rectal cream APPLY RECTALLY 2 TIMES A DAY AS NEEDED 30 g 0  . ketoconazole (NIZORAL) 2 % cream Apply topically daily. 15 g 0  . latanoprost (XALATAN) 0.005 % ophthalmic solution Place 1 drop into both eyes at bedtime. 2.5 mL 12  . levothyroxine (SYNTHROID) 100 MCG tablet Take 100 mcg by mouth daily.    Marland Kitchen linaclotide (LINZESS) 72 MCG capsule Take 1 capsule (72 mcg total) by mouth daily before breakfast. 30 capsule 5  . loratadine (CLARITIN) 10 MG tablet TAKE 1 TABLET ONCE DAILY 30 tablet 9  . magnesium oxide (MAG-OX) 400 (241.3 Mg) MG tablet Take 1 tablet (400 mg total) by mouth daily. (Patient taking differently: Take 400 mg by mouth 2 (two) times daily.) 30 tablet 0  . Multiple Vitamin (MULTIVITAMIN WITH MINERALS) TABS tablet Take 1 tablet by mouth daily.    . potassium chloride (KLOR-CON) 10 MEQ tablet TAKE 2 TABLETS TWICE A DAY 120 tablet 1  . QUEtiapine (SEROQUEL) 25 MG tablet TAKE 3 TABLETS AT BEDTIME AS DIRECTED 90 tablet 0  . simvastatin (ZOCOR) 80 MG tablet Take 1 tablet (80 mg total) by mouth daily. 90 tablet 3  . triamcinolone (KENALOG) 0.1 % Apply topically 2 (two) times daily as needed.    . venlafaxine (EFFEXOR) 75 MG tablet Take 1 tablet (75  mg total) by mouth daily. 90 tablet 3   Facility-Administered Medications Prior  to Visit  Medication Dose Route Frequency Provider Last Rate Last Admin  . cyanocobalamin ((VITAMIN B-12)) injection 1,000 mcg  1,000 mcg Intramuscular Q30 days [name], [name], MD   1,000 mcg at 10/26/20 1125    Allergies  Allergen Reactions  . Lipitor [Atorvastatin Calcium] Other (See Comments)    myalgias  . Lyrica [Pregabalin] Other (See Comments)    SORES IN MOUTH   . Motrin [Ibuprofen] Other (See Comments)    Pt denies  . Sibutramine Hcl Monohydrate Other (See Comments)    UNSPECIFIED REACTION   . Alendronate Sodium Nausea Only  . Latex Rash and Other (See Comments)    RA to latex 1982; includes bandaids    . Levofloxacin Nausea And Vomiting  . Meloxicam Other (See Comments)    Vaginal itching     Review of Systems  Constitutional: Negative.   HENT: Negative.   Eyes: Negative.   Respiratory: Negative.   Cardiovascular: Negative.   Gastrointestinal: Negative.   Musculoskeletal: Negative.   Skin: Positive for color change and wound.  All other systems reviewed and are negative.      Objective:      Physical Exam Vitals reviewed.  Constitutional:      Appearance: Normal appearance.  HENT:     Head: Normocephalic.     Nose: Nose normal.  Eyes:     Conjunctiva/sclera: Conjunctivae normal.  Cardiovascular:     Rate and Rhythm: Normal rate and regular rhythm.     Pulses: Normal pulses.     Heart sounds: Normal heart sounds.  Pulmonary:     Effort: Pulmonary effort is normal.     Breath sounds: Normal breath sounds.  Skin:    Findings: Erythema and laceration present.          Comments: Laceration healing apropriately  Neurological:     Mental Status: She is alert.       BP 133/76   Pulse 76   Temp (!) 97.4 F (36.3 C) (Temporal)   Ht 6' 8"  (2.032 m)   Wt 186 lb (84.4 kg)   SpO2 95%   BMI 20.43 kg/m  Wt Readings from Last 3 Encounters:  11/17/20 186 lb (84.4 kg)  11/11/20 186 lb (84.4 kg)  10/30/20 186 lb (84.4 kg)    There are no  preventive care reminders to display for this patient.  There are no preventive care reminders to display for this patient.   Lab Results  Component Value Date   TSH 0.326 (L) 09/10/2020   Lab Results  Component Value Date   WBC 6.2 09/10/2020   HGB 12.2 09/10/2020   HCT 36.8 09/10/2020   MCV 88 09/10/2020   PLT 211 09/10/2020   Lab Results  Component Value Date   NA 142 09/10/2020   K 4.3 09/10/2020   CO2 23 09/10/2020   GLUCOSE 85 09/10/2020   BUN 12 09/10/2020   CREATININE 1.13 (H) 09/10/2020   BILITOT 0.4 09/10/2020   ALKPHOS 123 (H) 09/10/2020   AST 24 09/10/2020   ALT 12 09/10/2020   PROT 6.4 09/10/2020   ALBUMIN 4.4 09/10/2020   CALCIUM 8.7 09/10/2020   ANIONGAP 7 04/13/2018   EGFR 50 (L) 09/10/2020   Lab Results  Component Value Date   CHOL 138 09/10/2020   Lab Results  Component Value Date   HDL 64 09/10/2020   Lab Results  Component Value Date  LDLCALC 56 09/10/2020   Lab Results  Component Value Date   TRIG 95 09/10/2020   Lab Results  Component Value Date   CHOLHDL 2.2 09/10/2020   Lab Results  Component Value Date   HGBA1C 5.2 07/06/2017       Assessment & Plan:   Problem List Items Addressed This Visit      Other   Encounter for removal of sutures - Primary    Following up 1 week after suture removal.  Skin is healing as expected.  Provided education to patient tomorrow to watch out for signs and symptoms .  Patient verbalized understanding.  No pain  Advised patient to follow-up with worsening unresolved symptoms.          No orders of the defined types were placed in this encounter.    Ivy Lynn, NP

## 2020-11-21 ENCOUNTER — Other Ambulatory Visit: Payer: Self-pay | Admitting: Family Medicine

## 2020-11-27 ENCOUNTER — Other Ambulatory Visit: Payer: Self-pay | Admitting: Family Medicine

## 2020-11-27 ENCOUNTER — Ambulatory Visit (INDEPENDENT_AMBULATORY_CARE_PROVIDER_SITE_OTHER): Payer: Medicare Other | Admitting: Family Medicine

## 2020-11-27 ENCOUNTER — Other Ambulatory Visit: Payer: Self-pay

## 2020-11-27 DIAGNOSIS — E538 Deficiency of other specified B group vitamins: Secondary | ICD-10-CM

## 2020-12-14 ENCOUNTER — Ambulatory Visit (INDEPENDENT_AMBULATORY_CARE_PROVIDER_SITE_OTHER): Payer: Medicare Other | Admitting: Family Medicine

## 2020-12-14 ENCOUNTER — Telehealth: Payer: Self-pay | Admitting: Family Medicine

## 2020-12-14 ENCOUNTER — Telehealth: Payer: Self-pay

## 2020-12-14 ENCOUNTER — Other Ambulatory Visit: Payer: Self-pay

## 2020-12-14 ENCOUNTER — Encounter: Payer: Self-pay | Admitting: Family Medicine

## 2020-12-14 VITALS — BP 112/58 | HR 92 | Ht 65.0 in

## 2020-12-14 DIAGNOSIS — E7800 Pure hypercholesterolemia, unspecified: Secondary | ICD-10-CM

## 2020-12-14 DIAGNOSIS — K581 Irritable bowel syndrome with constipation: Secondary | ICD-10-CM

## 2020-12-14 DIAGNOSIS — I10 Essential (primary) hypertension: Secondary | ICD-10-CM

## 2020-12-14 DIAGNOSIS — S81802D Unspecified open wound, left lower leg, subsequent encounter: Secondary | ICD-10-CM

## 2020-12-14 DIAGNOSIS — F339 Major depressive disorder, recurrent, unspecified: Secondary | ICD-10-CM

## 2020-12-14 DIAGNOSIS — F411 Generalized anxiety disorder: Secondary | ICD-10-CM

## 2020-12-14 DIAGNOSIS — E890 Postprocedural hypothyroidism: Secondary | ICD-10-CM | POA: Diagnosis not present

## 2020-12-14 DIAGNOSIS — Z23 Encounter for immunization: Secondary | ICD-10-CM

## 2020-12-14 MED ORDER — LINACLOTIDE 72 MCG PO CAPS: 72.0000 ug | ORAL_CAPSULE | Freq: Every day | ORAL | 5 refills | Status: AC

## 2020-12-14 MED ORDER — QUETIAPINE FUMARATE 25 MG PO TABS: 25.0000 mg | ORAL_TABLET | Freq: Every day | ORAL | 3 refills | Status: DC

## 2020-12-14 MED ORDER — CLONAZEPAM 0.25 MG PO TBDP: ORAL_TABLET | ORAL | 2 refills | Status: DC

## 2020-12-14 NOTE — Progress Notes (Addendum)
BP (!) 112/58   Pulse 92   Ht _0  (1.651 m)   BMI 30.95 kg/m    Subjective:   Patient ID: [name], female    DOB: [birth year], 80 y.o.   MRN: [id number]  HPI: [name] is a 80 y.o. female presenting on 12/14/2020 for No chief complaint on file.   HPI Hypothyroidism recheck Patient is coming in for thyroid recheck today as well. They deny any issues with hair changes or heat or cold problems or diarrhea or constipation. They deny any chest pain or palpitations. They are currently on levothyroxine 129mcrograms   Hyperlipidemia Patient is coming in for recheck of his hyperlipidemia. The patient is currently taking simvastatin. They deny any issues with myalgias or history of liver damage from it. They deny any focal numbness or weakness or chest pain.   Hypertension Patient is currently on furosemide, and their blood pressure today is 112/58. Patient denies any lightheadedness or dizziness. Patient denies headaches, blurred vision, chest pains, shortness of breath, or weakness. Denies any side effects from medication and is content with current medication.   Patient is coming in for a wound on her left shin that she sustained when a box fell on her and scraped the front of her shin.  Left some open areas, she did initially have stitches but not since is healed.  She does have some edema and swelling around it.  She does not have any purulence or redness or drainage.  Is still slightly tender especially with the swelling.  Has been using Betadine and triple antibiotic ointment on it and wrapping it with a nonstick gauze.  Anxiety recheck. Patient's is coming in for anxiety recheck.  She takes clonazepam and Effexor and then Seroquel to help her sleep at night she is doing well with those.  She denies any suicidal ideations or thoughts of hurting herself.  She does have a trach in place and that is a lot of what inhibits or causes her anxiety. [facility] 12/14/2020  11/11/2020 09/24/2020 09/10/2020 06/12/2020  Decreased Interest 0 1 0 0 0  Down, Depressed, Hopeless 0 1 0 0 0  PHQ - 2 Score 0 2 0 0 0  Altered sleeping - 1 - - -  Tired, decreased energy - 1 - - -  Change in appetite - 1 - - -  Feeling bad or failure about yourself  - 1 - - -  Trouble concentrating - 0 - - -  Moving slowly or fidgety/restless - 0 - - -  Suicidal thoughts - 0 - - -  PHQ-9 Score - 6 - - -  Difficult doing work/chores - Somewhat difficult - - -  Some recent data might be hidden     Relevant past medical, surgical, family and social history reviewed and updated as indicated. Interim medical history since our last visit reviewed. Allergies and medications reviewed and updated.  Review of Systems  Constitutional:  Negative for chills and fever.  Eyes:  Negative for visual disturbance.  Respiratory:  Positive for cough. Negative for chest tightness, shortness of breath and wheezing.   Cardiovascular:  Negative for chest pain and leg swelling.  Genitourinary:  Negative for difficulty urinating and dysuria.  Musculoskeletal:  Negative for back pain and gait problem.  Skin:  Positive for wound. Negative for rash.  Neurological:  Negative for light-headedness and headaches.  Psychiatric/Behavioral:  Negative for agitation and behavioral problems.   All other systems reviewed  and are negative.  Per HPI unless specifically indicated above   Allergies as of 12/14/2020       Reactions   Lipitor [atorvastatin Calcium] Other (See Comments)   myalgias   Lyrica [pregabalin] Other (See Comments)   SORES IN MOUTH    Motrin [ibuprofen] Other (See Comments)   Pt denies   Sibutramine Hcl Monohydrate Other (See Comments)   UNSPECIFIED REACTION    Alendronate Sodium Nausea Only   Latex Rash, Other (See Comments)   RA to latex 1982; includes bandaids    Levofloxacin Nausea And Vomiting   Meloxicam Other (See Comments)   Vaginal itching         Medication List         Accurate as of December 14, 2020 11:25 AM. If you have any questions, ask your nurse or doctor.          aspirin 81 MG chewable tablet Chew 81 mg by mouth at bedtime.   benzonatate 200 MG capsule Commonly known as: TESSALON Take 1 capsule (200 mg total) by mouth 3 (three) times daily as needed.   calcium carbonate 1250 (500 Ca) MG tablet Commonly known as: OS-CAL - dosed in mg of elemental calcium Take 1 tablet (500 mg of elemental calcium total) by mouth daily with breakfast.   clonazePAM 0.25 MG disintegrating tablet Commonly known as: KLONOPIN DISSOLVE 1 TABLET BY MOUTH 3 TIMES A DAY   furosemide 20 MG tablet Commonly known as: LASIX Take 10 mg by mouth daily. 10 mg po  Mon/Wed/Fridays   hydrocortisone 2.5 % rectal cream Commonly known as: ANUSOL-HC APPLY RECTALLY 2 TIMES A DAY AS NEEDED   ketoconazole 2 % cream Commonly known as: NIZORAL Apply topically daily.   latanoprost 0.005 % ophthalmic solution Commonly known as: XALATAN Place 1 drop into both eyes at bedtime.   levothyroxine 100 MCG tablet Commonly known as: SYNTHROID Take 100 mcg by mouth daily.   linaclotide 72 MCG capsule Commonly known as: Linzess Take 1 capsule (72 mcg total) by mouth daily before breakfast.   loratadine 10 MG tablet Commonly known as: CLARITIN TAKE 1 TABLET ONCE DAILY   magnesium oxide 400 (241.3 Mg) MG tablet Commonly known as: MAG-OX Take 1 tablet (400 mg total) by mouth daily. What changed: when to take this   multivitamin with minerals Tabs tablet Take 1 tablet by mouth daily.   potassium chloride 10 MEQ tablet Commonly known as: KLOR-CON TAKE 2 TABLETS TWICE A DAY   QUEtiapine 25 MG tablet Commonly known as: SEROQUEL Take 1 tablet (25 mg total) by mouth at bedtime. What changed: See the new instructions. Changed by: [name] Ashleyann Shoun, MD   simvastatin 80 MG tablet Commonly known as: ZOCOR Take 1 tablet (80 mg total) by mouth daily.   triamcinolone cream 0.1  % Commonly known as: KENALOG Apply topically 2 (two) times daily as needed.   venlafaxine 75 MG tablet Commonly known as: EFFEXOR Take 1 tablet (75 mg total) by mouth daily.   Vitamin D3 25 MCG (1000 UT) Caps 2 capsules         Objective:   BP (!) 112/58   Pulse 92   Ht _0  (1.651 m)   BMI 30.95 kg/m   Wt Readings from Last 3 Encounters:  11/17/20 186 lb (84.4 kg)  11/11/20 186 lb (84.4 kg)  10/30/20 186 lb (84.4 kg)    Physical Exam Vitals and nursing note reviewed.  Constitutional:      General:  She is not in acute distress.    Appearance: She is well-developed. She is not diaphoretic.  Eyes:     Conjunctiva/sclera: Conjunctivae normal.  Cardiovascular:     Rate and Rhythm: Normal rate and regular rhythm.     Heart sounds: Normal heart sounds. No murmur heard. Pulmonary:     Effort: Pulmonary effort is normal. No respiratory distress.     Breath sounds: Normal breath sounds. Stridor (Because of the tracheostomoy) present. No wheezing.  Musculoskeletal:        General: No tenderness. Normal range of motion.  Skin:    General: Skin is warm and dry.     Findings: Wound (Left shin open area of about 4 to 5 cm in diameter with a larger area that is healing from the abrasion.) present. No rash.  Neurological:     Mental Status: She is alert and oriented to person, place, and time.     Coordination: Coordination normal.  Psychiatric:        Behavior: Behavior normal.    Recommended change daily and put on Xeroform gauze and then wrap it every night and then keep it open during the day.  Assessment & Plan:   Problem List Items Addressed This Visit       Cardiovascular and Mediastinum   Essential hypertension   Relevant Orders   BMP8+EGFR     Endocrine   Hypothyroidism - Primary   Relevant Orders   TSH   BMP8+EGFR     Other   Depression, recurrent (HCC)   Relevant Medications   clonazePAM (KLONOPIN) 0.25 MG disintegrating tablet   GAD (generalized  anxiety disorder)   Relevant Medications   clonazePAM (KLONOPIN) 0.25 MG disintegrating tablet   Pure hypercholesterolemia   Other Visit Diagnoses     Need for shingles vaccine       Relevant Orders   Varicella-zoster vaccine IM (Shingrix) (Completed)   Irritable bowel syndrome with constipation       Relevant Medications   linaclotide (LINZESS) 72 MCG capsule   Wound of left lower extremity, subsequent encounter           Continue clonazepam, continue current medicines.  No change.  We will do blood work today to recheck her thyroid. Follow up plan: Return if symptoms worsen or fail to improve, for 2 to 3-week wound recheck of left leg.  Counseling provided for all of the vaccine components Orders Placed This Encounter  Procedures   Varicella-zoster vaccine IM (Shingrix)   TSH   BMP8+EGFR    Caryl Pina, MD [facility] 12/14/2020, 11:25 AM

## 2020-12-14 NOTE — Telephone Encounter (Signed)
Needs appt made with [name]

## 2020-12-14 NOTE — Telephone Encounter (Signed)
Left message for pt to return call. Pt was seen in the office today and [name] would like for pt to come back for a follow up in 2-3 weeks for wound check.   Made an appt for 7/13 at 8:55am.

## 2020-12-15 LAB — BMP8+EGFR
BUN/Creatinine Ratio: 17 (ref 12–28)
BUN: 15 mg/dL (ref 8–27)
CO2: 26 mmol/L (ref 20–29)
Calcium: 8.8 mg/dL (ref 8.7–10.3)
Chloride: 104 mmol/L (ref 96–106)
Creatinine, Ser: 0.89 mg/dL (ref 0.57–1.00)
Glucose: 100 mg/dL — ABNORMAL HIGH (ref 65–99)
Potassium: 4.3 mmol/L (ref 3.5–5.2)
Sodium: 144 mmol/L (ref 134–144)
eGFR: 66 mL/min/{1.73_m2} (ref >59)

## 2020-12-15 LAB — TSH: TSH: 0.719 u[IU]/mL (ref 0.450–4.500)

## 2020-12-19 ENCOUNTER — Other Ambulatory Visit: Payer: Self-pay | Admitting: Family Medicine

## 2020-12-21 ENCOUNTER — Telehealth: Payer: Medicare Other

## 2020-12-25 ENCOUNTER — Encounter: Payer: Self-pay | Admitting: Family Medicine

## 2020-12-25 ENCOUNTER — Encounter: Payer: Self-pay | Admitting: *Deleted

## 2020-12-25 ENCOUNTER — Ambulatory Visit (INDEPENDENT_AMBULATORY_CARE_PROVIDER_SITE_OTHER): Payer: Medicare Other | Admitting: Family Medicine

## 2020-12-25 ENCOUNTER — Other Ambulatory Visit: Payer: Self-pay

## 2020-12-25 VITALS — BP 121/77 | HR 83 | Ht 65.0 in | Wt 181.0 lb

## 2020-12-25 DIAGNOSIS — S81802D Unspecified open wound, left lower leg, subsequent encounter: Secondary | ICD-10-CM

## 2020-12-25 NOTE — Progress Notes (Signed)
BP 121/77   Pulse 83   Ht 5\' 5"  (1.651 m)   Wt 181 lb (82.1 kg)   SpO2 97%   BMI 30.12 kg/m    Subjective:   Patient ID: [name], female    DOB: 06-Aug-1940, 80 y.o.   MRN: [id number]  HPI: [name] is a 80 y.o. female presenting on 12/25/2020 for No chief complaint on file.   HPI Wound left leg follow-up Patient is coming in for left leg wound follow-up.  She has been using Xeroform gauze most of the day and then wrapping.  She says it is tender but it is healing.  She denies any fevers or chills or redness or warmth.  She denies any purulence or drainage  Relevant past medical, surgical, family and social history reviewed and updated as indicated. Interim medical history since our last visit reviewed. Allergies and medications reviewed and updated.  Review of Systems  Skin:  Positive for wound (Small scab is to be mostly healed). Negative for color change and rash.   Per HPI unless specifically indicated above   Allergies as of 12/25/2020       Reactions   Lipitor [atorvastatin Calcium] Other (See Comments)   myalgias   Lyrica [pregabalin] Other (See Comments)   SORES IN MOUTH    Motrin [ibuprofen] Other (See Comments)   Pt denies   Sibutramine Hcl Monohydrate Other (See Comments)   UNSPECIFIED REACTION    Alendronate Sodium Nausea Only   Latex Rash, Other (See Comments)   RA to latex 1982; includes bandaids    Levofloxacin Nausea And Vomiting   Meloxicam Other (See Comments)   Vaginal itching         Medication List        Accurate as of December 25, 2020 11:48 AM. If you have any questions, ask your nurse or doctor.          aspirin 81 MG chewable tablet Chew 81 mg by mouth at bedtime.   benzonatate 200 MG capsule Commonly known as: TESSALON Take 1 capsule (200 mg total) by mouth 3 (three) times daily as needed.   calcium carbonate 1250 (500 Ca) MG tablet Commonly known as: OS-CAL - dosed in mg of elemental calcium Take 1 tablet (500 mg of  elemental calcium total) by mouth daily with breakfast.   clonazePAM 0.25 MG disintegrating tablet Commonly known as: KLONOPIN DISSOLVE 1 TABLET BY MOUTH 3 TIMES A DAY   furosemide 20 MG tablet Commonly known as: LASIX Take 10 mg by mouth daily. 10 mg po  Mon/Wed/Fridays   hydrocortisone 2.5 % rectal cream Commonly known as: ANUSOL-HC APPLY RECTALLY 2 TIMES A DAY AS NEEDED   ketoconazole 2 % cream Commonly known as: NIZORAL Apply topically daily.   latanoprost 0.005 % ophthalmic solution Commonly known as: XALATAN Place 1 drop into both eyes at bedtime.   levothyroxine 100 MCG tablet Commonly known as: SYNTHROID Take 100 mcg by mouth daily.   linaclotide 72 MCG capsule Commonly known as: Linzess Take 1 capsule (72 mcg total) by mouth daily before breakfast.   loratadine 10 MG tablet Commonly known as: CLARITIN TAKE 1 TABLET ONCE DAILY   magnesium oxide 400 (241.3 Mg) MG tablet Commonly known as: MAG-OX Take 1 tablet (400 mg total) by mouth daily. What changed: when to take this   multivitamin with minerals Tabs tablet Take 1 tablet by mouth daily.   potassium chloride 10 MEQ tablet Commonly known as: KLOR-CON TAKE  2 TABLETS TWICE A DAY   QUEtiapine 25 MG tablet Commonly known as: SEROQUEL Take 1 tablet (25 mg total) by mouth at bedtime.   simvastatin 80 MG tablet Commonly known as: ZOCOR Take 1 tablet (80 mg total) by mouth daily.   triamcinolone cream 0.1 % Commonly known as: KENALOG Apply topically 2 (two) times daily as needed.   venlafaxine 75 MG tablet Commonly known as: EFFEXOR Take 1 tablet (75 mg total) by mouth daily.   Vitamin D3 25 MCG (1000 UT) Caps 2 capsules         Objective:   BP 121/77   Pulse 83   Ht 5\' 5"  (1.651 m)   Wt 181 lb (82.1 kg)   SpO2 97%   BMI 30.12 kg/m   Wt Readings from Last 3 Encounters:  12/25/20 181 lb (82.1 kg)  11/17/20 186 lb (84.4 kg)  11/11/20 186 lb (84.4 kg)    Physical Exam Vitals and  nursing note reviewed.  Skin:    General: Skin is warm.     Findings: Wound present.           Assessment & Plan:   Problem List Items Addressed This Visit   None Visit Diagnoses     Wound of left lower extremity, subsequent encounter    -  Primary        Follow up plan: Return if symptoms worsen or fail to improve.  Counseling provided for all of the vaccine components No orders of the defined types were placed in this encounter.   Caryl Pina, MD [facility] 12/25/2020, 11:48 AM

## 2020-12-29 ENCOUNTER — Other Ambulatory Visit: Payer: Self-pay

## 2020-12-29 ENCOUNTER — Ambulatory Visit (INDEPENDENT_AMBULATORY_CARE_PROVIDER_SITE_OTHER): Payer: Medicare Other

## 2020-12-29 DIAGNOSIS — E538 Deficiency of other specified B group vitamins: Secondary | ICD-10-CM | POA: Diagnosis not present

## 2020-12-29 MED ORDER — CYANOCOBALAMIN 1000 MCG/ML IJ SOLN
1000.0000 ug | INTRAMUSCULAR | Status: AC
  Administered 2020-12-29 – 2021-06-02 (×6): 1000 ug via INTRAMUSCULAR

## 2020-12-29 NOTE — Progress Notes (Signed)
Cyanocobalamin injection given to right deltoid.  Patient tolerated well. 

## 2020-12-31 ENCOUNTER — Other Ambulatory Visit: Payer: Self-pay | Admitting: *Deleted

## 2020-12-31 DIAGNOSIS — J386 Stenosis of larynx: Secondary | ICD-10-CM | POA: Diagnosis not present

## 2020-12-31 DIAGNOSIS — Z930 Tracheostomy status: Secondary | ICD-10-CM | POA: Diagnosis not present

## 2020-12-31 MED ORDER — QUETIAPINE FUMARATE 25 MG PO TABS: 25.0000 mg | ORAL_TABLET | Freq: Three times a day (TID) | ORAL | 3 refills | Status: DC

## 2021-01-06 ENCOUNTER — Ambulatory Visit: Payer: Medicare Other | Admitting: Family Medicine

## 2021-01-18 ENCOUNTER — Other Ambulatory Visit: Payer: Self-pay | Admitting: Family Medicine

## 2021-01-29 ENCOUNTER — Ambulatory Visit (INDEPENDENT_AMBULATORY_CARE_PROVIDER_SITE_OTHER): Payer: Medicare Other

## 2021-01-29 ENCOUNTER — Other Ambulatory Visit: Payer: Self-pay

## 2021-01-29 DIAGNOSIS — E538 Deficiency of other specified B group vitamins: Secondary | ICD-10-CM

## 2021-01-29 NOTE — Progress Notes (Signed)
Cyanocobalamin injection given to left deltoid.  Patient tolerated well. 

## 2021-02-02 ENCOUNTER — Telehealth: Payer: Medicare Other

## 2021-02-02 NOTE — Progress Notes (Signed)
Cardiology Office Note   Date:  02/03/2021   ID:  [name], DOB 12/08/1940, MRN [id number]  PCP:  [name], [name], MD  Cardiologist:   None   Chief Complaint  Patient presents with   AVR       History of Present Illness: [name] is a 80 y.o. female who presents for follow up after AVR and aortic surgery.  She was admitted in Jan 2019 for elective AVR and ascending root replacement.  Post op she had tamponade and VF arrest.  She required an RVAD.    She had a tracheostomy placed and did go to rehab.   Follow up echo demonstrated a normal EF.   There was moderate TR.  The AVR looked OK.    Since I last saw her she has done relatively okay.  She denies any new cardiovascular symptoms.  She denies any chest pressure, neck or arm discomfort.  She reports some anxiety at night.   Past Medical History:  Diagnosis Date   ACL tear    right  Dr. Gladstone Pih    Adenomatous polyp    Anxiety    Arthritis    "all over" (04/12/2018)   Ascending aortic aneurysm ([place])    note per chart per Dr Lucianne Lei Tright 4.7 cm 04/15/2015    Asthma    B12 deficiency    Chronic bronchitis ([place])    Depression    DUB (dysfunctional uterine bleeding) 10/96   Fibromyalgia    GERD (gastroesophageal reflux disease)    Glaucoma    bilaterally   Helicobacter pylori (H. pylori)    Hiatal hernia    History of blood transfusion 06/2017   "related to OHS"   History of kidney stones    History of left shoulder fracture    pt states fell off bed and broke ball in shoulder had rod placed    Hyperlipidemia    Hypertension    Hypothyroidism    Inspiratory stridor    Laryngeal stenosis    Occasional tremors    left arm    Osteoarthritis    Osteopenia    Osteoporosis    Thyroid cancer ([place]) 2017   Thyroid nodule    Tracheostomy in place [facility])     Past Surgical History:  Procedure Laterality Date   AORTIC VALVE REPLACEMENT N/A 07/10/2017   Procedure: AORTIC VALVE REPLACEMENT (AVR);   Surgeon: [name], Collier Salina, MD;  Location: [place];  Service: Open Heart Surgery;  Laterality: N/A;   APPENDECTOMY     BUNIONECTOMY  08/1999   right - Dr. Irving Shows    CARDIAC VALVE REPLACEMENT     CATARACT EXTRACTION, BILATERAL Bilateral 05/28/07-2009   "right-left"   [place]  03/31/95   Dr. Ovid Curd    DIRECT LARYNGOSCOPY WITH BOTOX INJECTION N/A 12/01/2017   Procedure: SUSPENDED DIRECT MICROLARYNGOSCOPY WITH KENALOG INJECTION, CO2 LASER;  Surgeon: Melida Quitter, MD;  Location: [place];  Service: ENT;  Laterality: N/A;   EXCISIONAL HEMORRHOIDECTOMY     EXPLORATION POST OPERATIVE OPEN HEART N/A 07/11/2017   Procedure: EXPLORATION POST OPERATIVE OPEN HEART;  Surgeon: Ivin Poot, MD;  Location: [place];  Service: Open Heart Surgery;  Laterality: N/A;   EYE SURGERY     also had left cataract removed    FRACTURE SURGERY     HEMATOMA EVACUATION N/A 07/11/2017   Procedure: EVACUATION HEMATOMA;  Surgeon: Ivin Poot, MD;  [facility];  Service: Open Heart Surgery;  Laterality: N/A;   HERNIA REPAIR     JOINT REPLACEMENT     MICROLARYNGOSCOPY WITH LASER AND BALLOON DILATION N/A 01/24/2018   Procedure: MICROLARYNGOSCOPY WITH LASER AND BALLOON DILATION;  Surgeon: Melida Quitter, MD;  Location: [place];  Service: ENT;  Laterality: N/A;   MICROLARYNGOSCOPY WITH LASER AND BALLOON DILATION N/A 04/13/2018   Procedure: MICROLARYNGOSCOPY WITH BALLOON DILATION;  Surgeon: Melida Quitter, MD;  Location: [facility];  Service: ENT;  Laterality: N/A;   NEPHROLITHOTOMY Left 04/07/2017   Procedure: NEPHROLITHOTOMY PERCUTANEOUS WITH SURGEON ACCESS;  Surgeon: Ardis Hughs, MD;  Location: [facility];  Service: Urology;  Laterality: Left;   ORIF HUMERUS FRACTURE  05/30/2011   Procedure: OPEN REDUCTION INTERNAL FIXATION (ORIF) PROXIMAL HUMERUS FRACTURE;  Surgeon: Augustin Schooling;  Location: [place];  Service: Orthopedics;  Laterality: Left;  open reduction internal fixation of  proximal humerus fracture   PLACEMENT OF CENTRIMAG VENTRICULAR ASSIST DEVICE Right 07/11/2017   Procedure: PLACEMENT OF CENTRIMAG VENTRICULAR ASSIST DEVICE;  Surgeon: Ivin Poot, MD;  Location: [place];  Service: Open Heart Surgery;  Laterality: Right;   REMOVAL OF CENTRIMAG VENTRICULAR ASSIST DEVICE N/A 07/17/2017   Procedure: REMOVAL OF RVAD WITH PUMP STANDBY;  Surgeon: Ivin Poot, MD;  Location: [place];  Service: Open Heart Surgery;  Laterality: N/A;   REPLACEMENT ASCENDING AORTA N/A 07/10/2017   Procedure: REPLACEMENT ASCENDING AORTA;  Surgeon: Ivin Poot, MD;  Location: [place];  Service: Open Heart Surgery;  Laterality: N/A;   RIGHT/LEFT HEART CATH AND CORONARY ANGIOGRAPHY N/A 06/13/2017   Procedure: RIGHT/LEFT HEART CATH AND CORONARY ANGIOGRAPHY;  Surgeon: Jolaine Artist, MD;  Location: [place] [facility];  Service: Cardiovascular;  Laterality: N/A;   TEE WITHOUT CARDIOVERSION N/A 07/10/2017   Procedure: TRANSESOPHAGEAL ECHOCARDIOGRAM (TEE);  Surgeon: [name], Collier Salina, MD;  Location: [place];  Service: Open Heart Surgery;  Laterality: N/A;   TEE WITHOUT CARDIOVERSION  07/11/2017   Procedure: TRANSESOPHAGEAL ECHOCARDIOGRAM (TEE);  Surgeon: [name], Collier Salina, MD;  Location: [place];  Service: Open Heart Surgery;;   TEE WITHOUT CARDIOVERSION N/A 07/17/2017   Procedure: TRANSESOPHAGEAL ECHOCARDIOGRAM (TEE);  Surgeon: [name], Collier Salina, MD;  Location: [place];  Service: Open Heart Surgery;  Laterality: N/A;   THYROID LOBECTOMY Left 07/27/2015   Procedure: LEFT THYROID LOBECTOMY;  Surgeon: Fanny Skates, MD;  [facility];  Service: General;  Laterality: Left;   THYROIDECTOMY N/A 08/06/2015   Procedure: RIGHT THYROID LOBECTOMY, REIMPLANTATION PARATHYROID;  Surgeon: Fanny Skates, MD;  Location[facility];  Service: General;  Laterality: N/A;   TOTAL KNEE ARTHROPLASTY Right 08/2009   Dr. Gladstone Pih   TRACHEOSTOMY  08/08/2017   VENTRAL HERNIA REPAIR  2/99     Current Outpatient Medications   Medication Sig Dispense Refill   aspirin 81 MG chewable tablet Chew 81 mg by mouth at bedtime.      benzonatate (TESSALON) 200 MG capsule Take 1 capsule (200 mg total) by mouth 3 (three) times daily as needed. 30 capsule 1   calcium carbonate (OS-CAL - DOSED IN MG OF ELEMENTAL CALCIUM) 1250 (500 Ca) MG tablet Take 1 tablet (500 mg of elemental calcium total) by mouth daily with breakfast. 30 tablet 0   Cholecalciferol (VITAMIN D3) 25 MCG (1000 UT) CAPS 2 capsules     clonazePAM (KLONOPIN) 0.25 MG disintegrating tablet DISSOLVE 1 TABLET BY MOUTH 3 TIMES A DAY 90 tablet 2   hydrocortisone (ANUSOL-HC) 2.5 % rectal  cream APPLY RECTALLY 2 TIMES A DAY AS NEEDED 30 g 2   ketoconazole (NIZORAL) 2 % cream Apply topically daily. 15 g 0   latanoprost (XALATAN) 0.005 % ophthalmic solution Place 1 drop into both eyes at bedtime. 2.5 mL 12   levothyroxine (SYNTHROID) 100 MCG tablet Take 100 mcg by mouth daily.     linaclotide (LINZESS) 72 MCG capsule Take 1 capsule (72 mcg total) by mouth daily before breakfast. 30 capsule 5   loratadine (CLARITIN) 10 MG tablet TAKE 1 TABLET ONCE DAILY 30 tablet 9   magnesium oxide (MAG-OX) 400 (241.3 Mg) MG tablet Take 1 tablet (400 mg total) by mouth daily. 30 tablet 0   Multiple Vitamin (MULTIVITAMIN WITH MINERALS) TABS tablet Take 1 tablet by mouth daily.     potassium chloride (KLOR-CON) 10 MEQ tablet TAKE 2 TABLETS TWICE A DAY 120 tablet 1   QUEtiapine (SEROQUEL) 25 MG tablet Take 1 tablet (25 mg total) by mouth 3 (three) times daily. 90 tablet 3   simvastatin (ZOCOR) 80 MG tablet Take 1 tablet (80 mg total) by mouth daily. 90 tablet 3   triamcinolone (KENALOG) 0.1 % Apply topically 2 (two) times daily as needed.     venlafaxine (EFFEXOR) 75 MG tablet Take 1 tablet (75 mg total) by mouth daily. 90 tablet 3   furosemide (LASIX) 20 MG tablet Take 10 mg by mouth daily. 10 mg po  Mon/Wed/Fridays (Patient not taking: Reported on 02/03/2021)     Current Facility-Administered  Medications  Medication Dose Route Frequency Provider Last Rate Last Admin   cyanocobalamin ((VITAMIN B-12)) injection 1,000 mcg  1,000 mcg Intramuscular Q30 days [name], [name], MD   1,000 mcg at 01/29/21 1131    Allergies:   Lipitor [atorvastatin calcium], Lyrica [pregabalin], Motrin [ibuprofen], Sibutramine hcl monohydrate, Alendronate sodium, Latex, Levofloxacin, and Meloxicam    ROS:  Please see the history of present illness.   Otherwise, review of systems are positive for none.   All other systems are reviewed and negative.    PHYSICAL EXAM: VS:  BP 118/70   Pulse 83   Ht '5\' 5"'$  (1.651 m)   Wt 184 lb (83.5 kg)   BMI 30.62 kg/m  , BMI Body mass index is 30.62 kg/m.  GENERAL:  Well appearing NECK:  No jugular venous distention, waveform within normal limits, carotid upstroke brisk and symmetric, no bruits, no thyromegaly LUNGS:  Clear to auscultation bilaterally CHEST: Well-healed surgical scar HEART:  PMI not displaced or sustained,S1 and S2 within normal limits, no S3, no S4, no clicks, no rubs, 3 out of 6 brief apical systolic murmur radiating slightly at the aortic outflow tract, no diastolic murmurs ABD:  Flat, positive bowel sounds normal in frequency in pitch, no bruits, no rebound, no guarding, no midline pulsatile mass, no hepatomegaly, no splenomegaly EXT:  2 plus pulses throughout, no edema, no cyanosis no clubbing  EKG:  EKG is  ordered today. Sinus rhythm, rate 83, right bundle branch block, left anterior fascicular block, no acute ST-T wave changes.  Recent Labs: 09/10/2020: ALT 12; Hemoglobin 12.2; Platelets 211 12/14/2020: BUN 15; Creatinine, Ser 0.89; Potassium 4.3; Sodium 144; TSH 0.719    Lipid Panel    Component Value Date/Time   CHOL 138 09/10/2020 1342   CHOL 166 10/25/2012 0826   TRIG 95 09/10/2020 1342   TRIG 92 05/03/2016 1437   TRIG 82 10/25/2012 0826   HDL 64 09/10/2020 1342   HDL 58 05/03/2016 1437   HDL  69 10/25/2012 0826   CHOLHDL 2.2  09/10/2020 1342   LDLCALC 56 09/10/2020 1342   LDLCALC 91 03/14/2014 1139   LDLCALC 81 10/25/2012 0826      Wt Readings from Last 3 Encounters:  02/03/21 184 lb (83.5 kg)  12/25/20 181 lb (82.1 kg)  11/17/20 186 lb (84.4 kg)      Other studies Reviewed: Additional studies/ records that were reviewed today include:   Labs Review of the above records demonstrates:   See elsewhere   ASSESSMENT AND PLAN   AVR/AORTIC ROOT REPLACEMENT:    Patient has had no new symptoms.  She had a stable valve repair in April of last year.  I will follow this clinically and see her back in 1 year.  RV FAILURE:   This result of her previous echo.  No change in therapy.   HTN:    Blood pressure is well controlled.  She will continue meds as listed.  RBBB: This is chronic.  No change in therapy.  LEG PAIN:    She did have a leg wound with some trauma in the right pretibial area.  She has had ulcer that is slowly healing.  She had minimally abnormal ABIs.  She is not having any new symptoms.  No further testing is indicated this year.    Current medicines are reviewed at length with the patient today.  The patient does not have concerns regarding medicines.  The following changes have been made: None  Labs/ tests ordered today include: None  Orders Placed This Encounter  Procedures   EKG 12-Lead      Disposition:   FU with me in 12 months.     Signed, Minus Breeding, MD  02/03/2021 1:46 PM   [facility]

## 2021-02-03 ENCOUNTER — Ambulatory Visit (INDEPENDENT_AMBULATORY_CARE_PROVIDER_SITE_OTHER): Payer: Medicare Other | Admitting: Cardiology

## 2021-02-03 ENCOUNTER — Encounter: Payer: Self-pay | Admitting: Cardiology

## 2021-02-03 ENCOUNTER — Other Ambulatory Visit: Payer: Self-pay

## 2021-02-03 VITALS — BP 118/70 | HR 83 | Ht 65.0 in | Wt 184.0 lb

## 2021-02-03 DIAGNOSIS — I50810 Right heart failure, unspecified: Secondary | ICD-10-CM

## 2021-02-03 DIAGNOSIS — Z952 Presence of prosthetic heart valve: Secondary | ICD-10-CM

## 2021-02-03 DIAGNOSIS — I10 Essential (primary) hypertension: Secondary | ICD-10-CM

## 2021-02-03 NOTE — Patient Instructions (Signed)
Medication Instructions:  The current medical regimen is effective;  continue present plan and medications.  *If you need a refill on your cardiac medications before your next appointment, please call your pharmacy*  Follow-Up: At [facility], you and your health needs are our priority.  As part of our continuing mission to provide you with exceptional heart care, we have created designated Provider Care Teams.  These Care Teams include your primary Cardiologist (physician) and Advanced Practice Providers (APPs -  Physician Assistants and Nurse Practitioners) who all work together to provide you with the care you need, when you need it.  We recommend signing up for the patient portal called "MyChart".  Sign up information is provided on this After Visit Summary.  MyChart is used to connect with patients for Virtual Visits (Telemedicine).  Patients are able to view lab/test results, encounter notes, upcoming appointments, etc.  Non-urgent messages can be sent to your provider as well.   To learn more about what you can do with MyChart, go to [email or web address].    Your next appointment:   1 year(s)  The format for your next appointment:   In Person  Provider:   James Hochrein, MD   Thank you for choosing [facility]!!    

## 2021-02-21 DIAGNOSIS — J029 Acute pharyngitis, unspecified: Secondary | ICD-10-CM | POA: Diagnosis not present

## 2021-02-21 DIAGNOSIS — R52 Pain, unspecified: Secondary | ICD-10-CM | POA: Diagnosis not present

## 2021-02-21 DIAGNOSIS — J020 Streptococcal pharyngitis: Secondary | ICD-10-CM | POA: Diagnosis not present

## 2021-03-02 ENCOUNTER — Other Ambulatory Visit: Payer: Self-pay

## 2021-03-02 ENCOUNTER — Ambulatory Visit (INDEPENDENT_AMBULATORY_CARE_PROVIDER_SITE_OTHER): Payer: Medicare Other | Admitting: *Deleted

## 2021-03-02 DIAGNOSIS — E538 Deficiency of other specified B group vitamins: Secondary | ICD-10-CM

## 2021-03-02 NOTE — Progress Notes (Signed)
Pt given B12 injection IM right deltoid and tolerated well. 

## 2021-03-15 ENCOUNTER — Telehealth: Payer: Medicare Other

## 2021-03-18 ENCOUNTER — Encounter: Payer: Self-pay | Admitting: Family Medicine

## 2021-03-18 ENCOUNTER — Other Ambulatory Visit: Payer: Self-pay

## 2021-03-18 ENCOUNTER — Ambulatory Visit (INDEPENDENT_AMBULATORY_CARE_PROVIDER_SITE_OTHER): Payer: Medicare Other | Admitting: Family Medicine

## 2021-03-18 VITALS — BP 114/69 | HR 77 | Ht 65.0 in | Wt 183.0 lb

## 2021-03-18 DIAGNOSIS — F339 Major depressive disorder, recurrent, unspecified: Secondary | ICD-10-CM

## 2021-03-18 DIAGNOSIS — I10 Essential (primary) hypertension: Secondary | ICD-10-CM

## 2021-03-18 DIAGNOSIS — Z23 Encounter for immunization: Secondary | ICD-10-CM | POA: Diagnosis not present

## 2021-03-18 DIAGNOSIS — E890 Postprocedural hypothyroidism: Secondary | ICD-10-CM | POA: Diagnosis not present

## 2021-03-18 DIAGNOSIS — E7800 Pure hypercholesterolemia, unspecified: Secondary | ICD-10-CM

## 2021-03-18 DIAGNOSIS — F411 Generalized anxiety disorder: Secondary | ICD-10-CM

## 2021-03-18 MED ORDER — VENLAFAXINE HCL 100 MG PO TABS: 100.0000 mg | ORAL_TABLET | Freq: Every day | ORAL | 3 refills | Status: DC

## 2021-03-18 NOTE — Progress Notes (Signed)
BP 114/69   Pulse 77   Ht 5' 5"  (1.651 m)   Wt 183 lb (83 kg)   SpO2 98%   BMI 30.45 kg/m    Subjective:   Patient ID: [name], female    DOB: 05/21/1941, 80 y.o.   MRN: [id number]  HPI: [name] is a 80 y.o. female presenting on 03/18/2021 for Medical Management of Chronic Issues, Anxiety, and Hypertension   HPI Hypertension Patient is currently on furosemide, and their blood pressure today is 114/69. Patient denies any lightheadedness or dizziness. Patient denies headaches, blurred vision, chest pains, shortness of breath, or weakness. Denies any side effects from medication and is content with current medication.   Hyperlipidemia Patient is coming in for recheck of his hyperlipidemia. The patient is currently taking simvastatin. They deny any issues with myalgias or history of liver damage from it. They deny any focal numbness or weakness or chest pain.   Hypothyroidism recheck Patient is coming in for thyroid recheck today as well. They deny any issues with hair changes or heat or cold problems or diarrhea or constipation. They deny any chest pain or palpitations. They are currently on levothyroxine 100 micrograms   Anxiety recheck Patient is coming in for anxiety depression recheck today.  She currently takes Seroquel and Effexor and they have been helping her although other anxiety has been a little bit more recently.  She uses the camazepam at night sometimes to help with sleep and she feels like is not working as well as it did previously.  She also uses to calm her anxiety when she gets worked up because of her breathing and her tracheostomy.  She still has refills on the prescriptions of the clonazepam but is just not working as well.  Relevant past medical, surgical, family and social history reviewed and updated as indicated. Interim medical history since our last visit reviewed. Allergies and medications reviewed and updated.  Review of Systems   Constitutional:  Negative for chills and fever.  HENT:  Negative for congestion, ear discharge and ear pain.   Eyes:  Negative for redness and visual disturbance.  Respiratory:  Negative for cough, chest tightness, shortness of breath and wheezing.   Cardiovascular:  Negative for chest pain and leg swelling.  Genitourinary:  Negative for difficulty urinating and dysuria.  Skin:  Negative for rash.  Neurological:  Negative for light-headedness and headaches.  Psychiatric/Behavioral:  Positive for dysphoric mood and sleep disturbance. Negative for agitation, behavioral problems, self-injury and suicidal ideas. The patient is nervous/anxious.   All other systems reviewed and are negative.  Per HPI unless specifically indicated above   Allergies as of 03/18/2021       Reactions   Lipitor [atorvastatin Calcium] Other (See Comments)   myalgias   Lyrica [pregabalin] Other (See Comments)   SORES IN MOUTH    Motrin [ibuprofen] Other (See Comments)   Pt denies   Sibutramine Hcl Monohydrate Other (See Comments)   UNSPECIFIED REACTION    Alendronate Sodium Nausea Only   Latex Rash, Other (See Comments)   RA to latex 1982; includes bandaids    Levofloxacin Nausea And Vomiting   Meloxicam Other (See Comments)   Vaginal itching         Medication List        Accurate as of March 18, 2021 11:07 AM. If you have any questions, ask your nurse or doctor.          aspirin 81 MG chewable  tablet Chew 81 mg by mouth at bedtime.   benzonatate 200 MG capsule Commonly known as: TESSALON Take 1 capsule (200 mg total) by mouth 3 (three) times daily as needed.   calcium carbonate 1250 (500 Ca) MG tablet Commonly known as: OS-CAL - dosed in mg of elemental calcium Take 1 tablet (500 mg of elemental calcium total) by mouth daily with breakfast.   clonazePAM 0.25 MG disintegrating tablet Commonly known as: KLONOPIN DISSOLVE 1 TABLET BY MOUTH 3 TIMES A DAY   furosemide 20 MG  tablet Commonly known as: LASIX Take 10 mg by mouth daily. 10 mg po  Mon/Wed/Fridays   hydrocortisone 2.5 % rectal cream Commonly known as: ANUSOL-HC APPLY RECTALLY 2 TIMES A DAY AS NEEDED   ketoconazole 2 % cream Commonly known as: NIZORAL Apply topically daily.   latanoprost 0.005 % ophthalmic solution Commonly known as: XALATAN Place 1 drop into both eyes at bedtime.   levothyroxine 100 MCG tablet Commonly known as: SYNTHROID Take 100 mcg by mouth daily.   linaclotide 72 MCG capsule Commonly known as: Linzess Take 1 capsule (72 mcg total) by mouth daily before breakfast.   loratadine 10 MG tablet Commonly known as: CLARITIN TAKE 1 TABLET ONCE DAILY   magnesium oxide 400 (241.3 Mg) MG tablet Commonly known as: MAG-OX Take 1 tablet (400 mg total) by mouth daily.   multivitamin with minerals Tabs tablet Take 1 tablet by mouth daily.   potassium chloride 10 MEQ tablet Commonly known as: KLOR-CON TAKE 2 TABLETS TWICE A DAY   QUEtiapine 25 MG tablet Commonly known as: SEROQUEL Take 1 tablet (25 mg total) by mouth 3 (three) times daily.   simvastatin 80 MG tablet Commonly known as: ZOCOR Take 1 tablet (80 mg total) by mouth daily.   triamcinolone cream 0.1 % Commonly known as: KENALOG Apply topically 2 (two) times daily as needed.   venlafaxine 75 MG tablet Commonly known as: EFFEXOR Take 1 tablet (75 mg total) by mouth daily.   Vitamin D3 25 MCG (1000 UT) Caps 2 capsules         Objective:   BP 114/69   Pulse 77   Ht 5' 5"  (1.651 m)   Wt 183 lb (83 kg)   SpO2 98%   BMI 30.45 kg/m   Wt Readings from Last 3 Encounters:  03/18/21 183 lb (83 kg)  02/03/21 184 lb (83.5 kg)  12/25/20 181 lb (82.1 kg)    Physical Exam Vitals and nursing note reviewed.  Constitutional:      General: She is not in acute distress.    Appearance: She is well-developed. She is not diaphoretic.  HENT:     Head:     Comments: Tracheostomy in place Eyes:      Conjunctiva/sclera: Conjunctivae normal.  Cardiovascular:     Rate and Rhythm: Normal rate and regular rhythm.     Heart sounds: Murmur heard.  Pulmonary:     Effort: Pulmonary effort is normal. No respiratory distress.     Breath sounds: Normal breath sounds. No wheezing or rhonchi.  Musculoskeletal:        General: No tenderness. Normal range of motion.  Skin:    General: Skin is warm and dry.     Findings: No rash.  Neurological:     Mental Status: She is alert and oriented to person, place, and time.     Coordination: Coordination normal.  Psychiatric:        Behavior: Behavior normal.  Results for orders placed or performed in visit on 12/14/20  TSH  Result Value Ref Range   TSH 0.719 0.450 - 4.500 uIU/mL  BMP8+EGFR  Result Value Ref Range   Glucose 100 (H) 65 - 99 mg/dL   BUN 15 8 - 27 mg/dL   Creatinine, Ser 0.89 0.57 - 1.00 mg/dL   eGFR 66 >59 mL/min/1.73   BUN/Creatinine Ratio 17 12 - 28   Sodium 144 134 - 144 mmol/L   Potassium 4.3 3.5 - 5.2 mmol/L   Chloride 104 96 - 106 mmol/L   CO2 26 20 - 29 mmol/L   Calcium 8.8 8.7 - 10.3 mg/dL    Assessment & Plan:   Problem List Items Addressed This Visit       Cardiovascular and Mediastinum   Essential hypertension - Primary   Relevant Orders   CBC with Differential/Platelet   CMP14+EGFR     Endocrine   Hypothyroidism   Relevant Orders   TSH     Other   Depression, recurrent (HCC)   Relevant Medications   venlafaxine (EFFEXOR) 100 MG tablet   GAD (generalized anxiety disorder)   Relevant Medications   venlafaxine (EFFEXOR) 100 MG tablet   Other Relevant Orders   Lipid panel   TSH   Pure hypercholesterolemia   Relevant Orders   Lipid panel   Other Visit Diagnoses     Need for immunization against influenza       Relevant Orders   Flu Vaccine QUAD High Dose(Fluad) (Completed)     Increase Effexor to see if it helps more with her anxiety  Continue current meds otherwise, still has plenty  clonazepam, has 2 refills according to [facility]  Follow up plan: Return in about 3 months (around 06/17/2021), or if symptoms worsen or fail to improve, for Hypertension and thyroid and depression and cholesterol.  Counseling provided for all of the vaccine components Orders Placed This Encounter  Procedures   Flu Vaccine QUAD High Dose(Fluad)    Caryl Pina, MD [facility] 03/18/2021, 11:07 AM

## 2021-03-19 LAB — CBC WITH DIFFERENTIAL/PLATELET
Basophils Absolute: 0 10*3/uL (ref 0.0–0.2)
Basos: 0 %
EOS (ABSOLUTE): 0.2 10*3/uL (ref 0.0–0.4)
Eos: 3 %
Hematocrit: 36.8 % (ref 34.0–46.6)
Hemoglobin: 12.3 g/dL (ref 11.1–15.9)
Immature Grans (Abs): 0 10*3/uL (ref 0.0–0.1)
Immature Granulocytes: 0 %
Lymphocytes Absolute: 1.3 10*3/uL (ref 0.7–3.1)
Lymphs: 24 %
MCH: 29.2 pg (ref 26.6–33.0)
MCHC: 33.4 g/dL (ref 31.5–35.7)
MCV: 87 fL (ref 79–97)
Monocytes Absolute: 0.5 10*3/uL (ref 0.1–0.9)
Monocytes: 9 %
Neutrophils Absolute: 3.4 10*3/uL (ref 1.4–7.0)
Neutrophils: 64 %
Platelets: 225 10*3/uL (ref 150–450)
RBC: 4.21 x10E6/uL (ref 3.77–5.28)
RDW: 13.6 % (ref 11.7–15.4)
WBC: 5.4 10*3/uL (ref 3.4–10.8)

## 2021-03-19 LAB — LIPID PANEL
Chol/HDL Ratio: 2.5 ratio (ref 0.0–4.4)
Cholesterol, Total: 189 mg/dL (ref 100–199)
HDL: 77 mg/dL (ref >39)
LDL Chol Calc (NIH): 94 mg/dL (ref 0–99)
Triglycerides: 101 mg/dL (ref 0–149)
VLDL Cholesterol Cal: 18 mg/dL (ref 5–40)

## 2021-03-19 LAB — CMP14+EGFR
ALT: 12 IU/L (ref 0–32)
AST: 22 IU/L (ref 0–40)
Albumin/Globulin Ratio: 2 (ref 1.2–2.2)
Albumin: 4.4 g/dL (ref 3.7–4.7)
Alkaline Phosphatase: 111 IU/L (ref 44–121)
BUN/Creatinine Ratio: 16 (ref 12–28)
BUN: 13 mg/dL (ref 8–27)
Bilirubin Total: 0.3 mg/dL (ref 0.0–1.2)
CO2: 25 mmol/L (ref 20–29)
Calcium: 8.7 mg/dL (ref 8.7–10.3)
Chloride: 102 mmol/L (ref 96–106)
Creatinine, Ser: 0.83 mg/dL (ref 0.57–1.00)
Globulin, Total: 2.2 g/dL (ref 1.5–4.5)
Glucose: 105 mg/dL — ABNORMAL HIGH (ref 65–99)
Potassium: 4.3 mmol/L (ref 3.5–5.2)
Sodium: 141 mmol/L (ref 134–144)
Total Protein: 6.6 g/dL (ref 6.0–8.5)
eGFR: 72 mL/min/{1.73_m2} (ref >59)

## 2021-03-19 LAB — TSH: TSH: 0.825 u[IU]/mL (ref 0.450–4.500)

## 2021-03-22 ENCOUNTER — Other Ambulatory Visit: Payer: Self-pay | Admitting: Family Medicine

## 2021-03-22 DIAGNOSIS — Z85828 Personal history of other malignant neoplasm of skin: Secondary | ICD-10-CM | POA: Diagnosis not present

## 2021-03-22 DIAGNOSIS — Z08 Encounter for follow-up examination after completed treatment for malignant neoplasm: Secondary | ICD-10-CM | POA: Diagnosis not present

## 2021-03-22 DIAGNOSIS — L570 Actinic keratosis: Secondary | ICD-10-CM | POA: Diagnosis not present

## 2021-03-22 DIAGNOSIS — X32XXXD Exposure to sunlight, subsequent encounter: Secondary | ICD-10-CM | POA: Diagnosis not present

## 2021-04-02 ENCOUNTER — Other Ambulatory Visit: Payer: Self-pay

## 2021-04-02 ENCOUNTER — Ambulatory Visit (INDEPENDENT_AMBULATORY_CARE_PROVIDER_SITE_OTHER): Payer: Medicare Other

## 2021-04-02 DIAGNOSIS — E538 Deficiency of other specified B group vitamins: Secondary | ICD-10-CM | POA: Diagnosis not present

## 2021-04-02 DIAGNOSIS — R300 Dysuria: Secondary | ICD-10-CM

## 2021-04-08 ENCOUNTER — Other Ambulatory Visit: Payer: Self-pay

## 2021-04-08 ENCOUNTER — Other Ambulatory Visit: Payer: Medicare Other

## 2021-04-09 DIAGNOSIS — R269 Unspecified abnormalities of gait and mobility: Secondary | ICD-10-CM | POA: Diagnosis not present

## 2021-04-09 DIAGNOSIS — Z930 Tracheostomy status: Secondary | ICD-10-CM | POA: Diagnosis not present

## 2021-04-09 DIAGNOSIS — R5381 Other malaise: Secondary | ICD-10-CM | POA: Diagnosis not present

## 2021-04-13 DIAGNOSIS — J386 Stenosis of larynx: Secondary | ICD-10-CM | POA: Diagnosis not present

## 2021-04-13 DIAGNOSIS — Z930 Tracheostomy status: Secondary | ICD-10-CM | POA: Diagnosis not present

## 2021-04-22 ENCOUNTER — Telehealth: Payer: Medicare Other

## 2021-05-03 ENCOUNTER — Ambulatory Visit (INDEPENDENT_AMBULATORY_CARE_PROVIDER_SITE_OTHER): Payer: Medicare Other

## 2021-05-03 ENCOUNTER — Other Ambulatory Visit: Payer: Self-pay

## 2021-05-03 DIAGNOSIS — E538 Deficiency of other specified B group vitamins: Secondary | ICD-10-CM | POA: Diagnosis not present

## 2021-05-03 DIAGNOSIS — R300 Dysuria: Secondary | ICD-10-CM | POA: Diagnosis not present

## 2021-05-03 LAB — URINALYSIS, COMPLETE
Bilirubin, UA: NEGATIVE
Glucose, UA: NEGATIVE
Ketones, UA: NEGATIVE
Leukocytes,UA: NEGATIVE
Nitrite, UA: NEGATIVE
Protein,UA: NEGATIVE
RBC, UA: NEGATIVE
Specific Gravity, UA: <1.005 — ABNORMAL LOW (ref 1.005–1.030)
Urobilinogen, Ur: 0.2 mg/dL (ref 0.2–1.0)
pH, UA: 5.5 (ref 5.0–7.5)

## 2021-05-03 LAB — MICROSCOPIC EXAMINATION
Bacteria, UA: NONE SEEN
Epithelial Cells (non renal): NONE SEEN /hpf (ref 0–10)
RBC, Urine: NONE SEEN /hpf (ref 0–2)
Renal Epithel, UA: NONE SEEN /hpf
WBC, UA: NONE SEEN /hpf (ref 0–5)

## 2021-05-03 NOTE — Progress Notes (Signed)
Cyanocobalamin injection given to right deltoid.  Patient tolerated well. 

## 2021-05-04 DIAGNOSIS — E890 Postprocedural hypothyroidism: Secondary | ICD-10-CM | POA: Diagnosis not present

## 2021-05-04 DIAGNOSIS — R5383 Other fatigue: Secondary | ICD-10-CM | POA: Diagnosis not present

## 2021-05-04 DIAGNOSIS — Z85850 Personal history of malignant neoplasm of thyroid: Secondary | ICD-10-CM | POA: Diagnosis not present

## 2021-05-07 LAB — URINE CULTURE

## 2021-05-10 DIAGNOSIS — H40022 Open angle with borderline findings, high risk, left eye: Secondary | ICD-10-CM | POA: Diagnosis not present

## 2021-05-10 DIAGNOSIS — H401111 Primary open-angle glaucoma, right eye, mild stage: Secondary | ICD-10-CM | POA: Diagnosis not present

## 2021-05-21 ENCOUNTER — Other Ambulatory Visit: Payer: Self-pay | Admitting: Family Medicine

## 2021-05-27 DIAGNOSIS — R269 Unspecified abnormalities of gait and mobility: Secondary | ICD-10-CM | POA: Diagnosis not present

## 2021-05-27 DIAGNOSIS — Z930 Tracheostomy status: Secondary | ICD-10-CM | POA: Diagnosis not present

## 2021-05-27 DIAGNOSIS — R5381 Other malaise: Secondary | ICD-10-CM | POA: Diagnosis not present

## 2021-06-02 ENCOUNTER — Ambulatory Visit (INDEPENDENT_AMBULATORY_CARE_PROVIDER_SITE_OTHER): Payer: Medicare Other

## 2021-06-02 DIAGNOSIS — E538 Deficiency of other specified B group vitamins: Secondary | ICD-10-CM

## 2021-06-02 NOTE — Progress Notes (Signed)
Cyanocobalamin injection given to left deltoid.  Patient tolerated well. 

## 2021-06-10 DIAGNOSIS — Z930 Tracheostomy status: Secondary | ICD-10-CM | POA: Diagnosis not present

## 2021-06-10 DIAGNOSIS — J386 Stenosis of larynx: Secondary | ICD-10-CM | POA: Diagnosis not present

## 2021-06-14 ENCOUNTER — Ambulatory Visit (INDEPENDENT_AMBULATORY_CARE_PROVIDER_SITE_OTHER): Payer: Medicare Other | Admitting: Licensed Clinical Social Worker

## 2021-06-14 DIAGNOSIS — F339 Major depressive disorder, recurrent, unspecified: Secondary | ICD-10-CM

## 2021-06-14 DIAGNOSIS — I10 Essential (primary) hypertension: Secondary | ICD-10-CM

## 2021-06-14 DIAGNOSIS — I480 Paroxysmal atrial fibrillation: Secondary | ICD-10-CM

## 2021-06-14 DIAGNOSIS — F411 Generalized anxiety disorder: Secondary | ICD-10-CM

## 2021-06-14 DIAGNOSIS — K219 Gastro-esophageal reflux disease without esophagitis: Secondary | ICD-10-CM

## 2021-06-14 DIAGNOSIS — Z930 Tracheostomy status: Secondary | ICD-10-CM

## 2021-06-14 NOTE — Patient Instructions (Addendum)
Visit Information  Patient Goals:  Protect My Health (Patient).  Manage anxiety issues of client  Timeframe:  Short-Term Goal Priority:  Medium Progress:  On Track Start Date:     06/14/21                        Expected End Date:     09/09/21                   Follow Up Date    08/10/21 at 10:00 AM   Protect My Health (Patient)   Manage anxiety issues of client    Why is this important?   Screening tests can find diseases early when they are easier to treat.  Your doctor or nurse will talk with you about which tests are important for you.  Getting shots for common diseases like the flu and shingles will help prevent them.     Patient Self Care Activities:  Self administers medications as prescribed Attends all scheduled provider appointments Performs ADL's independently  Patient Coping Strengths:  Family Friends Spirituality Hopefulness  Patient Self Care Deficits:  Mobility issues Pain issues  Patient Goals:  - spend time or talk with others every day - practice relaxation or meditation daily - keep a calendar with appointment dates -talk regularly with spouse or with daughter about client needs  Follow Up Plan: [name] to call client or [name], daughter of client, on 08/10/21 at 10:00 AM to assess client needs    [name].Sonja Manseau MSW, [name] Licensed Clinical Social Worker [facility] Care Management [phone]

## 2021-06-14 NOTE — Chronic Care Management (AMB) (Signed)
Chronic Care Management    Clinical Social Work Note  06/14/2021 Name: [name] MRN: [id number] DOB: 1941/01/12  [name] is a 80 y.o. year old female who is a primary care patient of [name], [name], MD. The CCM team was consulted to assist the patient with chronic disease management and/or care coordination needs related to: [name] .   Engaged with patient /daughter of patient, [name], by telephone for follow up visit in response to provider referral for social work chronic care management and care coordination services.   Consent to Services:  The patient was given information about Chronic Care Management services, agreed to services, and gave verbal consent prior to initiation of services.  Please see initial visit note for detailed documentation.   Patient agreed to services and consent obtained.   Assessment: Review of patient past medical history, allergies, medications, and health status, including review of relevant consultants reports was performed today as part of a comprehensive evaluation and provision of chronic care management and care coordination services.     SDOH (Social Determinants of Health) assessments and interventions performed:  SDOH Interventions    Flowsheet Row Most Recent Value  SDOH Interventions   Physical Activity Interventions Other (Comments)  [walking challenges. uses a cane to help her walk]  Stress Interventions Provide Counseling  [client has stress related to managing medical condtions]  Depression Interventions/Treatment  Currently on Treatment        Advanced Directives Status: See [employer]yn[employer] application for related entries.  CCM Care Plan  Allergies  Allergen Reactions   Lipitor [Atorvastatin Calcium] Other (See Comments)    myalgias   Lyrica [Pregabalin] Other (See Comments)    SORES IN MOUTH    Motrin [Ibuprofen] Other (See Comments)    Pt denies   Sibutramine Hcl Monohydrate Other (See Comments)     UNSPECIFIED REACTION    Alendronate Sodium Nausea Only   Latex Rash and Other (See Comments)    RA to latex 1982; includes bandaids     Levofloxacin Nausea And Vomiting   Meloxicam Other (See Comments)    Vaginal itching     Outpatient Encounter Medications as of 06/14/2021  Medication Sig Note   aspirin 81 MG chewable tablet Chew 81 mg by mouth at bedtime.     benzonatate (TESSALON) 200 MG capsule Take 1 capsule (200 mg total) by mouth 3 (three) times daily as needed.    calcium carbonate (OS-CAL - DOSED IN MG OF ELEMENTAL CALCIUM) 1250 (500 Ca) MG tablet Take 1 tablet (500 mg of elemental calcium total) by mouth daily with breakfast.    Cholecalciferol (VITAMIN D3) 25 MCG (1000 UT) CAPS 2 capsules    clonazePAM (KLONOPIN) 0.25 MG disintegrating tablet DISSOLVE 1 TABLET BY MOUTH 3 TIMES A DAY    furosemide (LASIX) 20 MG tablet Take 10 mg by mouth daily. 10 mg po  Mon/Wed/Fridays 09/24/2020: Taking QD   hydrocortisone (ANUSOL-HC) 2.5 % rectal cream APPLY RECTALLY 2 TIMES A DAY AS NEEDED    ketoconazole (NIZORAL) 2 % cream Apply topically daily.    latanoprost (XALATAN) 0.005 % ophthalmic solution Place 1 drop into both eyes at bedtime.    levothyroxine (SYNTHROID) 100 MCG tablet Take 100 mcg by mouth daily.    linaclotide (LINZESS) 72 MCG capsule Take 1 capsule (72 mcg total) by mouth daily before breakfast.    loratadine (CLARITIN) 10 MG tablet TAKE 1 TABLET ONCE DAILY    magnesium oxide (MAG-OX) 400 (241.3 Mg)  MG tablet Take 1 tablet (400 mg total) by mouth daily.    Multiple Vitamin (MULTIVITAMIN WITH MINERALS) TABS tablet Take 1 tablet by mouth daily.    potassium chloride (KLOR-CON) 10 MEQ tablet TAKE TWO TABLETS BY MOUTH TWICE DAILY    QUEtiapine (SEROQUEL) 25 MG tablet Take 1 tablet (25 mg total) by mouth 3 (three) times daily.    simvastatin (ZOCOR) 80 MG tablet Take 1 tablet (80 mg total) by mouth daily.    triamcinolone (KENALOG) 0.1 % Apply topically 2 (two) times daily as  needed.    venlafaxine (EFFEXOR) 100 MG tablet Take 1 tablet (100 mg total) by mouth daily.    No facility-administered encounter medications on file as of 06/14/2021.    Patient Active Problem List   Diagnosis Date Noted   History of papillary adenocarcinoma of thyroid 09/23/2020   Hypoparathyroidism ([place]) 09/23/2020   Hypothyroidism 05/15/2020   History of prosthetic aortic valve 10/15/2019   GAD (generalized anxiety disorder) 01/18/2019   Pure hypercholesterolemia 01/18/2019   Vitamin D deficiency 01/18/2019   Tracheostomy dependence ([place]) 07/19/2018   Bilateral vocal fold paralysis 07/19/2018   Posterior glottic stenosis 33/35/4562   Diastolic CHF ([place])    PAF (paroxysmal atrial fibrillation) ([place])    RVF (right ventricular failure) ([place])    RVAD (right ventricular assist device) present ([place])    S/P AVR 07/10/2017   Vitamin B 12 deficiency 03/01/2016   Ascending aortic aneurysm 08/20/2015   Thyroid cancer ([place]) 07/30/2015   Osteoporosis with fracture 04/16/2014   Proximal humerus fracture 05/30/2011   Depression, recurrent ([place]) 02/23/2010   GLAUCOMA 02/23/2010   CATARACTS 02/23/2010   RHEUMATIC FEVER 02/23/2010   Essential hypertension 02/23/2010   MITRAL VALVE PROLAPSE 02/23/2010   GERD 02/23/2010   Osteoarthritis 02/23/2010   Primary fibromyalgia syndrome 02/23/2010    Conditions to be addressed/monitored: monitor client management of anxiety issues  Care Plan : [name] care plan  Updates made by Katha Cabal, [name] since 06/14/2021 12:00 AM     Problem: Emotional Distress      Goal: Manage anxiety symptoms of client   Start Date: 06/14/2021  Expected End Date: 09/09/2021  This Visit's Progress: On track  Recent Progress: On track  Priority: Medium  Note:   Current Barriers:  Chronic Mental Health needs related to anxiety management Anxiety issues/Stress Challenges Mobility challenges Tracheostomy management Suicidal Ideation/Homicidal Ideation:  No  Clinical Social Work Goal(s):  patient will work with SW monthly by telephone or in person to reduce or manage symptoms related to anxiety and anxiety management patient will work with SW in next 30 days to address concerns related to mobility of client and related to ADLs completion of client Client will attend all scheduled medical appointments in next 30 days  Interventions: 1:1 collaboration with [name], [name], MD regarding development and update of comprehensive plan of care as evidenced by provider attestation and co-signature Discussed client needs with [name], daughter of client .  Reviewed pain issues of client and reviewed sleeping issues of client Discussed with Tomi Bamberger the tracheostomy care for [facility].  Tomi Bamberger said that client has a great deal of support from her spouse, Calin Ellery.  Tomi Bamberger did not mention any tracheostomy issues for client. Discussed ambulation needs of client. Tomi Bamberger said that client uses a cane to help client walk. Discussed mood status of client. Tomi Bamberger said that she thinks that client mood is stable. Client is often in positive mood and has good support from  her spouse and other family members Talked with Tomi Bamberger about CCM program support  Encouraged  client or Tomi Bamberger to call RNCM as needed for nursing support for client Encouraged client or Tomi Bamberger to call [name] as needed for social work support for client  Patient Self Care Activities:  Self administers medications as prescribed Attends all scheduled provider appointments Performs ADL's independently  Patient Coping Strengths:  Family Friends Spirituality Hopefulness  Patient Self Care Deficits:  Mobility issues Pain issues  Patient Goals:  - spend time or talk with others every day - practice relaxation or meditation daily - keep a calendar with appointment dates -talk regularly with spouse or with daughter about client needs  Follow Up Plan: [name] to call client or her  daughter, [name], on 08/10/21 at 10:00 AM to assess client needs.      [name].Caytlyn Evers MSW, [name] Licensed Clinical Social Worker [facility] Care Management [phone]

## 2021-06-17 ENCOUNTER — Ambulatory Visit (INDEPENDENT_AMBULATORY_CARE_PROVIDER_SITE_OTHER): Payer: Medicare Other | Admitting: Family Medicine

## 2021-06-17 ENCOUNTER — Encounter: Payer: Self-pay | Admitting: Family Medicine

## 2021-06-17 VITALS — BP 121/72 | HR 81 | Ht 65.0 in | Wt 185.0 lb

## 2021-06-17 DIAGNOSIS — F339 Major depressive disorder, recurrent, unspecified: Secondary | ICD-10-CM | POA: Diagnosis not present

## 2021-06-17 DIAGNOSIS — I10 Essential (primary) hypertension: Secondary | ICD-10-CM

## 2021-06-17 DIAGNOSIS — F411 Generalized anxiety disorder: Secondary | ICD-10-CM

## 2021-06-17 DIAGNOSIS — E7800 Pure hypercholesterolemia, unspecified: Secondary | ICD-10-CM

## 2021-06-17 DIAGNOSIS — E890 Postprocedural hypothyroidism: Secondary | ICD-10-CM | POA: Diagnosis not present

## 2021-06-17 MED ORDER — QUETIAPINE FUMARATE 50 MG PO TABS: 50.0000 mg | ORAL_TABLET | Freq: Every day | ORAL | 3 refills | Status: DC

## 2021-06-17 MED ORDER — CLONAZEPAM 0.25 MG PO TBDP: ORAL_TABLET | ORAL | 2 refills | Status: DC

## 2021-06-17 MED ORDER — CLONAZEPAM 0.25 MG PO TBDP
ORAL_TABLET | ORAL | 2 refills | Status: DC
Start: 2021-06-17 — End: 2021-09-17

## 2021-06-17 NOTE — Progress Notes (Signed)
BP 121/72    Pulse 81    Ht 5' 5"  (1.651 m)    Wt 185 lb (83.9 kg)    SpO2 99%    BMI 30.79 kg/m    Subjective:   Patient ID: [name], female    DOB: 05-02-1941, 80 y.o.   MRN: [id number]  HPI: [name] is a 80 y.o. female presenting on 06/17/2021 for Medical Management of Chronic Issues, Hypertension, and Hypothyroidism   HPI Hypothyroidism recheck with history of thyroid cancer Patient is coming in for thyroid recheck today as well. They deny any issues with hair changes or heat or cold problems or diarrhea or constipation. They deny any chest pain or palpitations. They are currently on levothyroxine 100 micrograms   Hypertension Patient is currently on furosemide , and their blood pressure today is 121/72. Patient denies any lightheadedness or dizziness. Patient denies headaches, blurred vision, chest pains, shortness of breath, or weakness. Denies any side effects from medication and is content with current medication.   Anxiety and depression Patient has a lot of anxiety and depression especially from the chronic irritation caused by her tracheostomy and the feeling the need to cough.  She says its been getting worse as couple months and she is getting more irritated more anxious.  She does currently have Effexor and clonazepam and Seroquel.  She says she is not sleeping as well at night and she still having issues especially at night.  Relevant past medical, surgical, family and social history reviewed and updated as indicated. Interim medical history since our last visit reviewed. Allergies and medications reviewed and updated.  Review of Systems  Constitutional:  Negative for chills and fever.  Eyes:  Negative for visual disturbance.  Respiratory:  Positive for cough. Negative for chest tightness and shortness of breath.   Cardiovascular:  Negative for chest pain and leg swelling.  Musculoskeletal:  Negative for back pain and gait problem.  Skin:  Negative for  rash.  Neurological:  Negative for dizziness, light-headedness and headaches.  Psychiatric/Behavioral:  Positive for dysphoric mood and sleep disturbance. Negative for agitation, behavioral problems, self-injury and suicidal ideas. The patient is nervous/anxious.   All other systems reviewed and are negative.  Per HPI unless specifically indicated above   Allergies as of 06/17/2021       Reactions   Lipitor [atorvastatin Calcium] Other (See Comments)   myalgias   Lyrica [pregabalin] Other (See Comments)   SORES IN MOUTH    Motrin [ibuprofen] Other (See Comments)   Pt denies   Sibutramine Hcl Monohydrate Other (See Comments)   UNSPECIFIED REACTION    Alendronate Sodium Nausea Only   Latex Rash, Other (See Comments)   RA to latex 1982; includes bandaids    Levofloxacin Nausea And Vomiting   Meloxicam Other (See Comments)   Vaginal itching         Medication List        Accurate as of June 17, 2021 11:46 AM. If you have any questions, ask your nurse or doctor.          aspirin 81 MG chewable tablet Chew 81 mg by mouth at bedtime.   benzonatate 200 MG capsule Commonly known as: TESSALON Take 1 capsule (200 mg total) by mouth 3 (three) times daily as needed.   calcium carbonate 1250 (500 Ca) MG tablet Commonly known as: OS-CAL - dosed in mg of elemental calcium Take 1 tablet (500 mg of elemental calcium total) by mouth daily  with breakfast.   clonazePAM 0.25 MG disintegrating tablet Commonly known as: KLONOPIN DISSOLVE 1 TABLET BY MOUTH 3 TIMES A DAY   furosemide 20 MG tablet Commonly known as: LASIX Take 10 mg by mouth daily. 10 mg po  Mon/Wed/Fridays   hydrocortisone 2.5 % rectal cream Commonly known as: ANUSOL-HC APPLY RECTALLY 2 TIMES A DAY AS NEEDED   ketoconazole 2 % cream Commonly known as: NIZORAL Apply topically daily.   latanoprost 0.005 % ophthalmic solution Commonly known as: XALATAN Place 1 drop into both eyes at bedtime.    levothyroxine 100 MCG tablet Commonly known as: SYNTHROID Take 100 mcg by mouth daily.   linaclotide 72 MCG capsule Commonly known as: Linzess Take 1 capsule (72 mcg total) by mouth daily before breakfast.   loratadine 10 MG tablet Commonly known as: CLARITIN TAKE 1 TABLET ONCE DAILY   magnesium oxide 400 (241.3 Mg) MG tablet Commonly known as: MAG-OX Take 1 tablet (400 mg total) by mouth daily.   multivitamin with minerals Tabs tablet Take 1 tablet by mouth daily.   potassium chloride 10 MEQ tablet Commonly known as: KLOR-CON TAKE TWO TABLETS BY MOUTH TWICE DAILY   QUEtiapine 50 MG tablet Commonly known as: SEROQUEL Take 1 tablet (50 mg total) by mouth at bedtime. What changed:  medication strength how much to take when to take this Changed by: Worthy Rancher, MD   simvastatin 80 MG tablet Commonly known as: ZOCOR Take 1 tablet (80 mg total) by mouth daily.   triamcinolone cream 0.1 % Commonly known as: KENALOG Apply topically 2 (two) times daily as needed.   venlafaxine 100 MG tablet Commonly known as: EFFEXOR Take 1 tablet (100 mg total) by mouth daily.   Vitamin D3 25 MCG (1000 UT) Caps 2 capsules         Objective:   BP 121/72    Pulse 81    Ht 5' 5"  (1.651 m)    Wt 185 lb (83.9 kg)    SpO2 99%    BMI 30.79 kg/m   Wt Readings from Last 3 Encounters:  06/17/21 185 lb (83.9 kg)  03/18/21 183 lb (83 kg)  02/03/21 184 lb (83.5 kg)    Physical Exam Vitals and nursing note reviewed.  Constitutional:      General: She is not in acute distress.    Appearance: She is well-developed. She is not diaphoretic.     Comments: Tracheostomy in place  Eyes:     Conjunctiva/sclera: Conjunctivae normal.  Cardiovascular:     Rate and Rhythm: Normal rate and regular rhythm.     Heart sounds: Normal heart sounds. No murmur heard. Pulmonary:     Effort: Pulmonary effort is normal. No respiratory distress.     Breath sounds: Normal breath sounds. No  wheezing.  Skin:    General: Skin is warm and dry.     Findings: No rash.  Neurological:     Mental Status: She is alert and oriented to person, place, and time.     Coordination: Coordination normal.  Psychiatric:        Mood and Affect: Mood is anxious and depressed.        Behavior: Behavior normal.        Thought Content: Thought content does not include suicidal ideation. Thought content does not include suicidal plan.      Assessment & Plan:   Problem List Items Addressed This Visit       Cardiovascular and Mediastinum  Essential hypertension - Primary   Relevant Orders   CBC with Differential/Platelet   CMP14+EGFR     Endocrine   Hypothyroidism   Relevant Orders   TSH     Other   Depression, recurrent (HCC)   Relevant Medications   clonazePAM (KLONOPIN) 0.25 MG disintegrating tablet   QUEtiapine (SEROQUEL) 50 MG tablet   GAD (generalized anxiety disorder)   Relevant Medications   clonazePAM (KLONOPIN) 0.25 MG disintegrating tablet   QUEtiapine (SEROQUEL) 50 MG tablet   Pure hypercholesterolemia   Relevant Orders   CMP14+EGFR   Lipid panel    Increase Seroquel to see if that helps her more.  Continue clonazepam and Effexor. Follow up plan: Return in about 3 months (around 09/15/2021), or if symptoms worsen or fail to improve, for Anxiety depression hypertension and thyroid recheck.  Counseling provided for all of the vaccine components Orders Placed This Encounter  Procedures   CBC with Differential/Platelet   CMP14+EGFR   Lipid panel   TSH    Caryl Pina, MD [facility] 06/17/2021, 11:46 AM

## 2021-06-17 NOTE — Addendum Note (Signed)
Addended by: Caryl Pina on: 06/17/2021 03:40 PM   Modules accepted: Orders

## 2021-06-26 DIAGNOSIS — I10 Essential (primary) hypertension: Secondary | ICD-10-CM

## 2021-06-26 DIAGNOSIS — I480 Paroxysmal atrial fibrillation: Secondary | ICD-10-CM

## 2021-06-26 DIAGNOSIS — F339 Major depressive disorder, recurrent, unspecified: Secondary | ICD-10-CM

## 2021-07-05 ENCOUNTER — Ambulatory Visit (INDEPENDENT_AMBULATORY_CARE_PROVIDER_SITE_OTHER): Payer: Medicare Other | Admitting: *Deleted

## 2021-07-05 DIAGNOSIS — E538 Deficiency of other specified B group vitamins: Secondary | ICD-10-CM | POA: Diagnosis not present

## 2021-07-05 MED ORDER — CYANOCOBALAMIN 1000 MCG/ML IJ SOLN
1000.0000 ug | Freq: Once | INTRAMUSCULAR | Status: AC
  Administered 2021-07-05: 1000 ug via INTRAMUSCULAR

## 2021-07-05 NOTE — Progress Notes (Signed)
Pt tolerated B12 injection well in right deltoid

## 2021-07-06 ENCOUNTER — Other Ambulatory Visit: Payer: Self-pay | Admitting: Family Medicine

## 2021-07-06 ENCOUNTER — Ambulatory Visit (INDEPENDENT_AMBULATORY_CARE_PROVIDER_SITE_OTHER): Payer: Medicare Other | Admitting: Family Medicine

## 2021-07-06 ENCOUNTER — Encounter: Payer: Self-pay | Admitting: Family Medicine

## 2021-07-06 ENCOUNTER — Ambulatory Visit (INDEPENDENT_AMBULATORY_CARE_PROVIDER_SITE_OTHER): Payer: Medicare Other

## 2021-07-06 VITALS — BP 143/79 | HR 92 | Temp 98.3°F | Ht 65.0 in | Wt 186.0 lb

## 2021-07-06 DIAGNOSIS — R051 Acute cough: Secondary | ICD-10-CM | POA: Diagnosis not present

## 2021-07-06 DIAGNOSIS — R059 Cough, unspecified: Secondary | ICD-10-CM | POA: Diagnosis not present

## 2021-07-06 DIAGNOSIS — J189 Pneumonia, unspecified organism: Secondary | ICD-10-CM | POA: Diagnosis not present

## 2021-07-06 DIAGNOSIS — R6889 Other general symptoms and signs: Secondary | ICD-10-CM | POA: Diagnosis not present

## 2021-07-06 MED ORDER — PREDNISONE 20 MG PO TABS
40.0000 mg | ORAL_TABLET | Freq: Every day | ORAL | 0 refills | Status: AC
Start: 2021-07-06 — End: 2021-07-11

## 2021-07-06 MED ORDER — AZITHROMYCIN 250 MG PO TABS: ORAL_TABLET | ORAL | 0 refills | Status: DC

## 2021-07-06 MED ORDER — BENZONATATE 100 MG PO CAPS
200.0000 mg | ORAL_CAPSULE | Freq: Three times a day (TID) | ORAL | 0 refills | Status: DC | PRN
Start: 2021-07-06 — End: 2022-09-16

## 2021-07-06 MED ORDER — AMOXICILLIN 875 MG PO TABS: 875.0000 mg | ORAL_TABLET | Freq: Two times a day (BID) | ORAL | 0 refills | Status: DC

## 2021-07-06 MED ORDER — GUAIFENESIN ER 600 MG PO TB12: 600.0000 mg | ORAL_TABLET | Freq: Two times a day (BID) | ORAL | 0 refills | Status: AC

## 2021-07-06 NOTE — Progress Notes (Signed)
Subjective:  Patient ID: [name], female    DOB: [birth year], 81 y.o.   MRN: [id number]  Patient Care Team: [name], [name], MD as PCP - General (Family Medicine) Minus Breeding, MD as Consulting Physician (Cardiology) Harlen Labs, MD as Referring Physician (Optometry) Melida Quitter, MD as Consulting Physician (Otolaryngology) Shea Evans [name], [name] as Social Worker (Licensed Clinical Social Worker) Delrae Rend, MD as Consulting Physician (Endocrinology)   Chief Complaint:  Cough (Congestion/Concern for pneumonia)   HPI: [name] is a 81 y.o. female presenting on 07/06/2021 for Cough (Congestion/Concern for pneumonia)   Pt presents today for ongoing cough, sputum production, and wheezing. States symptoms started over 7 days ago and do not seem to be improving. No weakness, confusion, decreased urine output, or fatigue.   Cough This is a new problem. The current episode started in the past 7 days. The problem has been gradually worsening. The problem occurs every few minutes. The cough is Productive of brown sputum and productive of purulent sputum. Associated symptoms include chills and wheezing. Pertinent negatives include no chest pain, ear congestion, ear pain, fever, headaches, heartburn, hemoptysis, myalgias, nasal congestion, postnasal drip, rash, rhinorrhea, sore throat, shortness of breath, sweats or weight loss. She has tried OTC cough suppressant for the symptoms. The treatment provided mild relief.    Relevant past medical, surgical, family, and social history reviewed and updated as indicated.  Allergies and medications reviewed and updated. Data reviewed: Chart in Epic.   Past Medical History:  Diagnosis Date   ACL tear    right  Dr. Gladstone Pih    Adenomatous polyp    Anxiety    Arthritis    "all over" (04/12/2018)   Ascending aortic aneurysm    note per chart per Dr Lucianne Lei Tright 4.7 cm 04/15/2015    Asthma    B12 deficiency    Chronic  bronchitis ([place])    Depression    DUB (dysfunctional uterine bleeding) 10/96   Fibromyalgia    GERD (gastroesophageal reflux disease)    Glaucoma    bilaterally   Helicobacter pylori (H. pylori)    Hiatal hernia    History of blood transfusion 06/2017   "related to OHS"   History of kidney stones    History of left shoulder fracture    pt states fell off bed and broke ball in shoulder had rod placed    Hyperlipidemia    Hypertension    Hypothyroidism    Inspiratory stridor    Laryngeal stenosis    Occasional tremors    left arm    Osteoarthritis    Osteopenia    Osteoporosis    Thyroid cancer ([place]) 2017   Thyroid nodule    Tracheostomy in place [facility])     Past Surgical History:  Procedure Laterality Date   AORTIC VALVE REPLACEMENT N/A 07/10/2017   Procedure: AORTIC VALVE REPLACEMENT (AVR);  Surgeon: [name], Collier Salina, MD;  Location: [place];  Service: Open Heart Surgery;  Laterality: N/A;   APPENDECTOMY     BUNIONECTOMY  08/1999   right - Dr. Irving Shows    CARDIAC VALVE REPLACEMENT     CATARACT EXTRACTION, BILATERAL Bilateral 05/28/07-2009   "right-left"   [place]  03/31/95   Dr. Ovid Curd    DIRECT LARYNGOSCOPY WITH BOTOX INJECTION N/A 12/01/2017   Procedure: SUSPENDED DIRECT MICROLARYNGOSCOPY WITH KENALOG INJECTION, CO2 LASER;  Surgeon: Melida Quitter, MD;  Location:  [facility];  Service: ENT;  Laterality: N/A;   EXCISIONAL HEMORRHOIDECTOMY     EXPLORATION POST OPERATIVE OPEN HEART N/A 07/11/2017   Procedure: EXPLORATION POST OPERATIVE OPEN HEART;  Surgeon: Ivin Poot, MD;  Location: [place];  Service: Open Heart Surgery;  Laterality: N/A;   EYE SURGERY     also had left cataract removed    FRACTURE SURGERY     HEMATOMA EVACUATION N/A 07/11/2017   Procedure: EVACUATION HEMATOMA;  Surgeon: Ivin Poot, MD;  Location: [place];  Service: Open Heart Surgery;  Laterality: N/A;   HERNIA REPAIR     JOINT REPLACEMENT      MICROLARYNGOSCOPY WITH LASER AND BALLOON DILATION N/A 01/24/2018   Procedure: MICROLARYNGOSCOPY WITH LASER AND BALLOON DILATION;  Surgeon: Melida Quitter, MD;  Location: [place];  Service: ENT;  Laterality: N/A;   MICROLARYNGOSCOPY WITH LASER AND BALLOON DILATION N/A 04/13/2018   Procedure: MICROLARYNGOSCOPY WITH BALLOON DILATION;  Surgeon: Melida Quitter, MD;  Location: [facility] OR;  Service: ENT;  Laterality: N/A;   NEPHROLITHOTOMY Left 04/07/2017   Procedure: NEPHROLITHOTOMY PERCUTANEOUS WITH SURGEON ACCESS;  Surgeon: Ardis Hughs, MD;  Location: [facility];  Service: Urology;  Laterality: Left;   ORIF HUMERUS FRACTURE  05/30/2011   Procedure: OPEN REDUCTION INTERNAL FIXATION (ORIF) PROXIMAL HUMERUS FRACTURE;  Surgeon: Augustin Schooling;  Location: [place];  Service: Orthopedics;  Laterality: Left;  open reduction internal fixation of proximal humerus fracture   PLACEMENT OF CENTRIMAG VENTRICULAR ASSIST DEVICE Right 07/11/2017   Procedure: PLACEMENT OF CENTRIMAG VENTRICULAR ASSIST DEVICE;  Surgeon: Ivin Poot, MD;  Location: [place];  Service: Open Heart Surgery;  Laterality: Right;   REMOVAL OF CENTRIMAG VENTRICULAR ASSIST DEVICE N/A 07/17/2017   Procedure: REMOVAL OF RVAD WITH PUMP STANDBY;  Surgeon: Ivin Poot, MD;  Location: [place];  Service: Open Heart Surgery;  Laterality: N/A;   REPLACEMENT ASCENDING AORTA N/A 07/10/2017   Procedure: REPLACEMENT ASCENDING AORTA;  Surgeon: Ivin Poot, MD;  Location: [place];  Service: Open Heart Surgery;  Laterality: N/A;   RIGHT/LEFT HEART CATH AND CORONARY ANGIOGRAPHY N/A 06/13/2017   Procedure: RIGHT/LEFT HEART CATH AND CORONARY ANGIOGRAPHY;  Surgeon: Jolaine Artist, MD;  Location: [place] [facility];  Service: Cardiovascular;  Laterality: N/A;   TEE WITHOUT CARDIOVERSION N/A 07/10/2017   Procedure: TRANSESOPHAGEAL ECHOCARDIOGRAM (TEE);  Surgeon: [name], Collier Salina, MD;  Location: [place];  Service: Open Heart Surgery;  Laterality: N/A;   TEE WITHOUT  CARDIOVERSION  07/11/2017   Procedure: TRANSESOPHAGEAL ECHOCARDIOGRAM (TEE);  Surgeon: [name], Collier Salina, MD;  Location: [place];  Service: Open Heart Surgery;;   TEE WITHOUT CARDIOVERSION N/A 07/17/2017   Procedure: TRANSESOPHAGEAL ECHOCARDIOGRAM (TEE);  Surgeon: [name], Collier Salina, MD;  Location: [place];  Service: Open Heart Surgery;  Laterality: N/A;   THYROID LOBECTOMY Left 07/27/2015   Procedure: LEFT THYROID LOBECTOMY;  Surgeon: Fanny Skates, MD;  [facility];  Service: General;  Laterality: Left;   THYROIDECTOMY N/A 08/06/2015   Procedure: RIGHT THYROID LOBECTOMY, REIMPLANTATION PARATHYROID;  Surgeon: Fanny Skates, MD;  Location[facility];  Service: General;  Laterality: N/A;   TOTAL KNEE ARTHROPLASTY Right 08/2009   Dr. Gladstone Pih   TRACHEOSTOMY  08/08/2017   VENTRAL HERNIA REPAIR  2/99    Social History   Socioeconomic History   Marital status: Married    Spouse name: Jimmy   Number of children: 1   Years of education: 11   Highest education level: 11th grade  Occupational  History   Occupation: homemaker  Tobacco Use   Smoking status: Never   Smokeless tobacco: Never  Vaping Use   Vaping Use: Never used  Substance and Sexual Activity   Alcohol use: No   Drug use: Never   Sexual activity: Not Currently    Birth control/protection: Post-menopausal  Other Topics Concern   Not on file  Social History Narrative   Lives with husband.    Social Determinants of Health   Financial Resource Strain: Low Risk    Difficulty of Paying Living Expenses: Not hard at all  Food Insecurity: No Food Insecurity   Worried About Charity fundraiser in the Last Year: Never true   [place] in the Last Year: Never true  Transportation Needs: No Transportation Needs   Lack of Transportation (Medical): No   Lack of Transportation (Non-Medical): No  Physical Activity: Inactive   Days of Exercise per Week: 0 days   Minutes of Exercise per Session: 0 min  Stress: Stress Concern Present    Feeling of Stress : To some extent  Social Connections: Moderately Isolated   Frequency of Communication with Friends and Family: More than three times a week   Frequency of Social Gatherings with Friends and Family: Three times a week   Attends Religious Services: Never   Active Member of Clubs or Organizations: No   Attends Archivist Meetings: Never   Marital Status: Married  Human resources officer Violence: Not At Risk   Fear of Current or Ex-Partner: No   Emotionally Abused: No   Physically Abused: No   Sexually Abused: No    Outpatient Encounter Medications as of 07/06/2021  Medication Sig   amoxicillin (AMOXIL) 875 MG tablet Take 1 tablet (875 mg total) by mouth 2 (two) times daily. 1 po BID   aspirin 81 MG chewable tablet Chew 81 mg by mouth at bedtime.    azithromycin (ZITHROMAX Z-PAK) 250 MG tablet As directed   benzonatate (TESSALON PERLES) 100 MG capsule Take 2 capsules (200 mg total) by mouth 3 (three) times daily as needed for cough.   calcium carbonate (OS-CAL - DOSED IN MG OF ELEMENTAL CALCIUM) 1250 (500 Ca) MG tablet Take 1 tablet (500 mg of elemental calcium total) by mouth daily with breakfast.   Cholecalciferol (VITAMIN D3) 25 MCG (1000 UT) CAPS 2 capsules   clonazePAM (KLONOPIN) 0.25 MG disintegrating tablet DISSOLVE 1 TABLET BY MOUTH 3 TIMES A DAY   furosemide (LASIX) 20 MG tablet Take 10 mg by mouth daily. 10 mg po  Mon/Wed/Fridays   guaiFENesin (MUCINEX) 600 MG 12 hr tablet Take 1 tablet (600 mg total) by mouth 2 (two) times daily for 10 days.   hydrocortisone (ANUSOL-HC) 2.5 % rectal cream APPLY RECTALLY 2 TIMES A DAY AS NEEDED   ketoconazole (NIZORAL) 2 % cream Apply topically daily.   latanoprost (XALATAN) 0.005 % ophthalmic solution Place 1 drop into both eyes at bedtime.   levothyroxine (SYNTHROID) 88 MCG tablet Take by mouth.   linaclotide (LINZESS) 72 MCG capsule Take 1 capsule (72 mcg total) by mouth daily before breakfast.   loratadine (CLARITIN)  10 MG tablet TAKE 1 TABLET ONCE DAILY   magnesium oxide (MAG-OX) 400 (241.3 Mg) MG tablet Take 1 tablet (400 mg total) by mouth daily.   Multiple Vitamin (MULTIVITAMIN WITH MINERALS) TABS tablet Take 1 tablet by mouth daily.   potassium chloride (KLOR-CON) 10 MEQ tablet TAKE TWO TABLETS BY MOUTH TWICE DAILY   predniSONE (DELTASONE)  20 MG tablet Take 2 tablets (40 mg total) by mouth daily with breakfast for 5 days.   QUEtiapine (SEROQUEL) 50 MG tablet Take 1 tablet (50 mg total) by mouth at bedtime.   simvastatin (ZOCOR) 80 MG tablet Take 1 tablet (80 mg total) by mouth daily.   triamcinolone (KENALOG) 0.1 % Apply topically 2 (two) times daily as needed.   venlafaxine (EFFEXOR) 75 MG tablet Take 75 mg by mouth daily.   [DISCONTINUED] benzonatate (TESSALON) 200 MG capsule Take 1 capsule (200 mg total) by mouth 3 (three) times daily as needed.   [DISCONTINUED] ketoconazole (NIZORAL) 2 % cream Apply topically.   [DISCONTINUED] latanoprost (XALATAN) 0.005 % ophthalmic solution Apply to eye.   [DISCONTINUED] loratadine (CLARITIN) 10 MG tablet Take 1 tablet by mouth daily.   [DISCONTINUED] simvastatin (ZOCOR) 80 MG tablet Take 1 tablet by mouth daily.   [DISCONTINUED] Cholecalciferol 25 MCG (1000 UT) tablet Take by mouth.   [DISCONTINUED] levothyroxine (SYNTHROID) 100 MCG tablet Take 100 mcg by mouth daily.   [DISCONTINUED] loratadine (CLARITIN) 10 MG tablet TAKE 1 TABLET ONCE DAILY   [DISCONTINUED] magnesium oxide (MAG-OX) 400 MG tablet Take by mouth.   [DISCONTINUED] QUEtiapine (SEROQUEL) 25 MG tablet Take 25 mg by mouth 3 (three) times daily.   [DISCONTINUED] venlafaxine (EFFEXOR) 100 MG tablet Take 1 tablet (100 mg total) by mouth daily.   No facility-administered encounter medications on file as of 07/06/2021.    Allergies  Allergen Reactions   Lipitor [Atorvastatin Calcium] Other (See Comments)    myalgias   Lyrica [Pregabalin] Other (See Comments)    SORES IN MOUTH    Motrin [Ibuprofen]  Other (See Comments)    Pt denies   Sibutramine Other (See Comments)    UNSPECIFIED REACTION  UNSPECIFIED REACTION  Reaction unknown   Sibutramine Hcl Monohydrate Other (See Comments)    UNSPECIFIED REACTION    Alendronate Sodium Nausea Only   Latex Rash and Other (See Comments)    RA to latex 1982; includes bandaids     Levofloxacin Nausea And Vomiting   Meloxicam Other (See Comments)    Vaginal itching     Review of Systems  Constitutional:  Positive for activity change and chills. Negative for appetite change, diaphoresis, fatigue, fever, unexpected weight change and weight loss.  HENT:  Positive for congestion. Negative for ear pain, postnasal drip, rhinorrhea and sore throat.   Respiratory:  Positive for cough and wheezing. Negative for hemoptysis, shortness of breath and stridor.   Cardiovascular:  Negative for chest pain, palpitations and leg swelling.  Gastrointestinal:  Negative for abdominal pain and heartburn.  Genitourinary:  Negative for decreased urine volume and difficulty urinating.  Musculoskeletal:  Negative for myalgias.  Skin:  Negative for rash.  Neurological:  Negative for dizziness, tremors, seizures, syncope, facial asymmetry, speech difficulty, weakness, light-headedness, numbness and headaches.  Psychiatric/Behavioral:  Negative for confusion.   All other systems reviewed and are negative.      Objective:  BP (!) 143/79    Pulse 92    Temp 98.3 F (36.8 C)    Ht 5\' 5"  (1.651 m)    Wt 186 lb (84.4 kg)    SpO2 97%    BMI 30.95 kg/m    Wt Readings from Last 3 Encounters:  07/06/21 186 lb (84.4 kg)  06/17/21 185 lb (83.9 kg)  03/18/21 183 lb (83 kg)    Physical Exam Vitals reviewed.  Constitutional:      General: She is not in acute  distress.    Appearance: Normal appearance. She is obese. She is not ill-appearing, toxic-appearing or diaphoretic.  HENT:     Head: Normocephalic and atraumatic.     Right Ear: Tympanic membrane, ear canal and  external ear normal.     Left Ear: Tympanic membrane, ear canal and external ear normal.     Nose: Nose normal.     Mouth/Throat:     Lips: Pink.     Mouth: Mucous membranes are moist.     Pharynx: Oropharynx is clear. No oropharyngeal exudate or posterior oropharyngeal erythema.     Tonsils: No tonsillar exudate or tonsillar abscesses.  Eyes:     Pupils: Pupils are equal, round, and reactive to light.  Neck:     Trachea: Tracheostomy present. No abnormal tracheal secretions.  Cardiovascular:     Rate and Rhythm: Normal rate and regular rhythm.     Heart sounds: Murmur heard.  Pulmonary:     Effort: Pulmonary effort is normal.     Breath sounds: Examination of the right-lower field reveals wheezing and rhonchi. Examination of the left-lower field reveals rhonchi. Wheezing and rhonchi present.  Musculoskeletal:     Right lower leg: No edema.     Left lower leg: No edema.  Lymphadenopathy:     Cervical: No cervical adenopathy.  Skin:    General: Skin is warm and dry.  Neurological:     General: No focal deficit present.     Mental Status: She is alert and oriented to person, place, and time.  Psychiatric:        Mood and Affect: Mood normal.        Behavior: Behavior normal.        Thought Content: Thought content normal.        Judgment: Judgment normal.    Results for orders placed or performed in visit on 05/03/21  Urine Culture   Specimen: Urine   UR  Result Value Ref Range   Urine Culture, Routine Final report    Organism ID, Bacteria Comment   Microscopic Examination   Urine  Result Value Ref Range   WBC, UA None seen 0 - 5 /hpf   RBC None seen 0 - 2 /hpf   Epithelial Cells (non renal) None seen 0 - 10 /hpf   Renal Epithel, UA None seen None seen /hpf   Bacteria, UA None seen None seen/Few  Urinalysis, Complete  Result Value Ref Range   Specific Gravity, UA <1.005 (L) 1.005 - 1.030   pH, UA 5.5 5.0 - 7.5   Color, UA Yellow Yellow   Appearance Ur Clear Clear    Leukocytes,UA Negative Negative   Protein,UA Negative Negative/Trace   Glucose, UA Negative Negative   Ketones, UA Negative Negative   RBC, UA Negative Negative   Bilirubin, UA Negative Negative   Urobilinogen, Ur 0.2 0.2 - 1.0 mg/dL   Nitrite, UA Negative Negative   Microscopic Examination See below:    *Note: Due to a large number of results and/or encounters for the requested time period, some results have not been displayed. A complete set of results can be found in Results Review.     X-Ray: CXR: concerning for increased consolidation in RLL vs worsening atelectasis . Preliminary x-ray reading by Monia Pouch, FNP-C, [facility].   Pertinent labs & imaging results that were available during my care of the patient were reviewed by me and considered in my medical decision making.  Assessment & Plan:  [name] was  seen today for cough.  Diagnoses and all orders for this visit:  Acute cough CXR concerning for RLL pneumonia.  -     DG Chest 2 View; Future  Community acquired pneumonia of right lower lobe of lung CXR concerning for RLL pneumonia and clinical exam consistent with PNA. Will treat with below. Pt aware of red flags which require emergent evaluation and treatment. Report any new, worsening, or persistent symptoms. Follow up in 2 weeks for reevaluation.  -     amoxicillin (AMOXIL) 875 MG tablet; Take 1 tablet (875 mg total) by mouth 2 (two) times daily. 1 po BID -     azithromycin (ZITHROMAX Z-PAK) 250 MG tablet; As directed -     predniSONE (DELTASONE) 20 MG tablet; Take 2 tablets (40 mg total) by mouth daily with breakfast for 5 days. -     guaiFENesin (MUCINEX) 600 MG 12 hr tablet; Take 1 tablet (600 mg total) by mouth 2 (two) times daily for 10 days. -     benzonatate (TESSALON PERLES) 100 MG capsule; Take 2 capsule    Continue all other maintenance medications.  Follow up plan: Return in about 2 weeks (around 07/20/2021), or if symptoms worsen or fail to improve, for  CAP.   Continue healthy lifestyle choices, including diet (rich in fruits, vegetables, and lean proteins, and low in salt and simple carbohydrates) and exercise (at least 30 minutes of moderate physical activity daily).  Educational handout given for CAP  The above assessment and management plan was discussed with the patient. The patient verbalized understanding of and has agreed to the management plan. Patient is aware to call the clinic if they develop any new symptoms or if symptoms persist or worsen. Patient is aware when to return to the clinic for a follow-up visit. Patient educated on when it is appropriate to go to the emergency department.   Monia Pouch, FNP-C [facility] [phone]

## 2021-07-21 ENCOUNTER — Encounter: Payer: Self-pay | Admitting: Family Medicine

## 2021-07-21 ENCOUNTER — Ambulatory Visit (INDEPENDENT_AMBULATORY_CARE_PROVIDER_SITE_OTHER): Payer: Medicare Other | Admitting: Family Medicine

## 2021-07-21 VITALS — BP 122/74 | HR 79 | Temp 98.3°F | Ht 65.0 in | Wt 188.0 lb

## 2021-07-21 DIAGNOSIS — Z09 Encounter for follow-up examination after completed treatment for conditions other than malignant neoplasm: Secondary | ICD-10-CM

## 2021-07-21 DIAGNOSIS — J189 Pneumonia, unspecified organism: Secondary | ICD-10-CM

## 2021-07-21 NOTE — Progress Notes (Signed)
Subjective:  Patient ID: [name], female    DOB: 06-May-1941, 81 y.o.   MRN: [id number]  Patient Care Team: [name], [name], MD as PCP - General (Family Medicine) Minus Breeding, MD as Consulting Physician (Cardiology) Harlen Labs, MD as Referring Physician (Optometry) Melida Quitter, MD as Consulting Physician (Otolaryngology) Shea Evans [name], [name] as Social Worker (Licensed Clinical Social Worker) Delrae Rend, MD as Consulting Physician (Endocrinology)   Chief Complaint:  community acquired pneumonia (Follow up)   HPI: [name] is a 81 y.o. female presenting on 07/21/2021 for community acquired pneumonia (Follow up)  Pt presents today for follow up evaluation after being seen and treated for CAP on 07/06/2021. She has completed her antibiotic regimen, prednisone, and Mucinex with resolution of symptoms. States she feels much better. No adverse side effects from the medications.   Relevant past medical, surgical, family, and social history reviewed and updated as indicated.  Allergies and medications reviewed and updated. Data reviewed: Chart in Epic.   Past Medical History:  Diagnosis Date   ACL tear    right  Dr. Gladstone Pih    Adenomatous polyp    Anxiety    Arthritis    "all over" (04/12/2018)   Ascending aortic aneurysm    note per chart per Dr Lucianne Lei Tright 4.7 cm 04/15/2015    Asthma    B12 deficiency    Chronic bronchitis ([place])    Depression    DUB (dysfunctional uterine bleeding) 10/96   Fibromyalgia    GERD (gastroesophageal reflux disease)    Glaucoma    bilaterally   Helicobacter pylori (H. pylori)    Hiatal hernia    History of blood transfusion 06/2017   "related to OHS"   History of kidney stones    History of left shoulder fracture    pt states fell off bed and broke ball in shoulder had rod placed    Hyperlipidemia    Hypertension    Hypothyroidism    Inspiratory stridor    Laryngeal stenosis    Occasional tremors    left  arm    Osteoarthritis    Osteopenia    Osteoporosis    Thyroid cancer ([place]) 2017   Thyroid nodule    Tracheostomy in place [facility])     Past Surgical History:  Procedure Laterality Date   AORTIC VALVE REPLACEMENT N/A 07/10/2017   Procedure: AORTIC VALVE REPLACEMENT (AVR);  Surgeon: [name], Collier Salina, MD;  Location: [place];  Service: Open Heart Surgery;  Laterality: N/A;   APPENDECTOMY     BUNIONECTOMY  08/1999   right - Dr. Irving Shows    CARDIAC VALVE REPLACEMENT     CATARACT EXTRACTION, BILATERAL Bilateral 05/28/07-2009   "right-left"   [place]  03/31/95   Dr. Ovid Curd    DIRECT LARYNGOSCOPY WITH BOTOX INJECTION N/A 12/01/2017   Procedure: SUSPENDED DIRECT MICROLARYNGOSCOPY WITH KENALOG INJECTION, CO2 LASER;  Surgeon: Melida Quitter, MD;  Location: [place];  Service: ENT;  Laterality: N/A;   EXCISIONAL HEMORRHOIDECTOMY     EXPLORATION POST OPERATIVE OPEN HEART N/A 07/11/2017   Procedure: EXPLORATION POST OPERATIVE OPEN HEART;  Surgeon: Ivin Poot, MD;  Location: [place];  Service: Open Heart Surgery;  Laterality: N/A;   EYE SURGERY     also had left cataract removed    FRACTURE SURGERY     HEMATOMA EVACUATION N/A 07/11/2017   Procedure: EVACUATION HEMATOMA;  Surgeon:  Ivin Poot, MD;  Location: [place];  Service: Open Heart Surgery;  Laterality: N/A;   HERNIA REPAIR     JOINT REPLACEMENT     MICROLARYNGOSCOPY WITH LASER AND BALLOON DILATION N/A 01/24/2018   Procedure: MICROLARYNGOSCOPY WITH LASER AND BALLOON DILATION;  Surgeon: Melida Quitter, MD;  Location: [place];  Service: ENT;  Laterality: N/A;   MICROLARYNGOSCOPY WITH LASER AND BALLOON DILATION N/A 04/13/2018   Procedure: MICROLARYNGOSCOPY WITH BALLOON DILATION;  Surgeon: Melida Quitter, MD;  Location: [facility] OR;  Service: ENT;  Laterality: N/A;   NEPHROLITHOTOMY Left 04/07/2017   Procedure: NEPHROLITHOTOMY PERCUTANEOUS WITH SURGEON ACCESS;  Surgeon: Ardis Hughs, MD;  [facility]: [facility];  Service: Urology;  Laterality: Left;   ORIF HUMERUS FRACTURE  05/30/2011   Procedure: OPEN REDUCTION INTERNAL FIXATION (ORIF) PROXIMAL HUMERUS FRACTURE;  Surgeon: Augustin Schooling;  Location: [place];  Service: Orthopedics;  Laterality: Left;  open reduction internal fixation of proximal humerus fracture   PLACEMENT OF CENTRIMAG VENTRICULAR ASSIST DEVICE Right 07/11/2017   Procedure: PLACEMENT OF CENTRIMAG VENTRICULAR ASSIST DEVICE;  Surgeon: Ivin Poot, MD;  Location: [place];  Service: Open Heart Surgery;  Laterality: Right;   REMOVAL OF CENTRIMAG VENTRICULAR ASSIST DEVICE N/A 07/17/2017   Procedure: REMOVAL OF RVAD WITH PUMP STANDBY;  Surgeon: Ivin Poot, MD;  Location: [place];  Service: Open Heart Surgery;  Laterality: N/A;   REPLACEMENT ASCENDING AORTA N/A 07/10/2017   Procedure: REPLACEMENT ASCENDING AORTA;  Surgeon: Ivin Poot, MD;  Location: [place];  Service: Open Heart Surgery;  Laterality: N/A;   RIGHT/LEFT HEART CATH AND CORONARY ANGIOGRAPHY N/A 06/13/2017   Procedure: RIGHT/LEFT HEART CATH AND CORONARY ANGIOGRAPHY;  Surgeon: Jolaine Artist, MD;  Location: [place] [facility];  Service: Cardiovascular;  Laterality: N/A;   TEE WITHOUT CARDIOVERSION N/A 07/10/2017   Procedure: TRANSESOPHAGEAL ECHOCARDIOGRAM (TEE);  Surgeon: [name], Collier Salina, MD;  Location: [place];  Service: Open Heart Surgery;  Laterality: N/A;   TEE WITHOUT CARDIOVERSION  07/11/2017   Procedure: TRANSESOPHAGEAL ECHOCARDIOGRAM (TEE);  Surgeon: [name], Collier Salina, MD;  Location: [place];  Service: Open Heart Surgery;;   TEE WITHOUT CARDIOVERSION N/A 07/17/2017   Procedure: TRANSESOPHAGEAL ECHOCARDIOGRAM (TEE);  Surgeon: [name], Collier Salina, MD;  Location: [place];  Service: Open Heart Surgery;  Laterality: N/A;   THYROID LOBECTOMY Left 07/27/2015   Procedure: LEFT THYROID LOBECTOMY;  Surgeon: Fanny Skates, MD;  [facility];  Service: General;  Laterality: Left;   THYROIDECTOMY N/A 08/06/2015   Procedure: RIGHT  THYROID LOBECTOMY, REIMPLANTATION PARATHYROID;  Surgeon: Fanny Skates, MD;  Location[facility];  Service: General;  Laterality: N/A;   TOTAL KNEE ARTHROPLASTY Right 08/2009   Dr. Gladstone Pih   TRACHEOSTOMY  08/08/2017   VENTRAL HERNIA REPAIR  2/99    Social History   Socioeconomic History   Marital status: Married    Spouse name: Jimmy   Number of children: 1   Years of education: 11   Highest education level: 11th grade  Occupational History   Occupation: homemaker  Tobacco Use   Smoking status: Never   Smokeless tobacco: Never  Vaping Use   Vaping Use: Never used  Substance and Sexual Activity   Alcohol use: No   Drug use: Never   Sexual activity: Not Currently    Birth control/protection: Post-menopausal  Other Topics Concern   Not on file  Social History Narrative   Lives with husband.    Social Determinants of Health   Financial  Resource Strain: Low Risk    Difficulty of Paying Living Expenses: Not hard at all  Food Insecurity: No Food Insecurity   Worried About Charity fundraiser in the Last Year: Never true   Ran Out of Food in the Last Year: Never true  Transportation Needs: No Transportation Needs   Lack of Transportation (Medical): No   Lack of Transportation (Non-Medical): No  Physical Activity: Inactive   Days of Exercise per Week: 0 days   Minutes of Exercise per Session: 0 min  Stress: Stress Concern Present   Feeling of Stress : To some extent  Social Connections: Moderately Isolated   Frequency of Communication with Friends and Family: More than three times a week   Frequency of Social Gatherings with Friends and Family: Three times a week   Attends Religious Services: Never   Active Member of Clubs or Organizations: No   Attends Archivist Meetings: Never   Marital Status: Married  Human resources officer Violence: Not At Risk   Fear of Current or Ex-Partner: No   Emotionally Abused: No   Physically Abused: No   Sexually Abused: No     Outpatient Encounter Medications as of 07/21/2021  Medication Sig   aspirin 81 MG chewable tablet Chew 81 mg by mouth at bedtime.    benzonatate (TESSALON PERLES) 100 MG capsule Take 2 capsules (200 mg total) by mouth 3 (three) times daily as needed for cough.   calcium carbonate (OS-CAL - DOSED IN MG OF ELEMENTAL CALCIUM) 1250 (500 Ca) MG tablet Take 1 tablet (500 mg of elemental calcium total) by mouth daily with breakfast.   Cholecalciferol (VITAMIN D3) 25 MCG (1000 UT) CAPS 2 capsules   clonazePAM (KLONOPIN) 0.25 MG disintegrating tablet DISSOLVE 1 TABLET BY MOUTH 3 TIMES A DAY   furosemide (LASIX) 20 MG tablet Take 10 mg by mouth daily. 10 mg po  Mon/Wed/Fridays   hydrocortisone (ANUSOL-HC) 2.5 % rectal cream APPLY RECTALLY 2 TIMES A DAY AS NEEDED   ketoconazole (NIZORAL) 2 % cream Apply topically daily.   latanoprost (XALATAN) 0.005 % ophthalmic solution Place 1 drop into both eyes at bedtime.   levothyroxine (SYNTHROID) 88 MCG tablet Take by mouth.   linaclotide (LINZESS) 72 MCG capsule Take 1 capsule (72 mcg total) by mouth daily before breakfast.   loratadine (CLARITIN) 10 MG tablet TAKE 1 TABLET ONCE DAILY   magnesium oxide (MAG-OX) 400 (241.3 Mg) MG tablet Take 1 tablet (400 mg total) by mouth daily.   Multiple Vitamin (MULTIVITAMIN WITH MINERALS) TABS tablet Take 1 tablet by mouth daily.   potassium chloride (KLOR-CON) 10 MEQ tablet TAKE TWO TABLETS BY MOUTH TWICE DAILY   QUEtiapine (SEROQUEL) 50 MG tablet Take 1 tablet (50 mg total) by mouth at bedtime.   simvastatin (ZOCOR) 80 MG tablet Take 1 tablet (80 mg total) by mouth daily.   triamcinolone (KENALOG) 0.1 % Apply topically 2 (two) times daily as needed.   venlafaxine (EFFEXOR) 75 MG tablet Take 75 mg by mouth daily.   [DISCONTINUED] amoxicillin (AMOXIL) 875 MG tablet Take 1 tablet (875 mg total) by mouth 2 (two) times daily. 1 po BID   [DISCONTINUED] azithromycin (ZITHROMAX Z-PAK) 250 MG tablet As directed   No  facility-administered encounter medications on file as of 07/21/2021.    Allergies  Allergen Reactions   Lipitor [Atorvastatin Calcium] Other (See Comments)    myalgias   Lyrica [Pregabalin] Other (See Comments)    SORES IN MOUTH    Motrin [Ibuprofen]  Other (See Comments)    Pt denies   Sibutramine Other (See Comments)    UNSPECIFIED REACTION  UNSPECIFIED REACTION  Reaction unknown   Sibutramine Hcl Monohydrate Other (See Comments)    UNSPECIFIED REACTION    Alendronate Sodium Nausea Only   Latex Rash and Other (See Comments)    RA to latex 1982; includes bandaids     Levofloxacin Nausea And Vomiting   Meloxicam Other (See Comments)    Vaginal itching     Review of Systems  Constitutional:  Negative for activity change, appetite change, chills, diaphoresis, fatigue, fever and unexpected weight change.  Respiratory:  Negative for cough, chest tightness, shortness of breath and wheezing.   Cardiovascular:  Negative for chest pain, palpitations and leg swelling.  Genitourinary:  Negative for decreased urine volume and difficulty urinating.  Neurological:  Negative for dizziness, tremors, seizures, syncope, facial asymmetry, speech difficulty, weakness, light-headedness, numbness and headaches.  All other systems reviewed and are negative.      Objective:  BP 122/74    Pulse 79    Temp 98.3 F (36.8 C)    Ht 5\' 5"  (1.651 m)    Wt 188 lb (85.3 kg)    SpO2 97%    BMI 31.28 kg/m    Wt Readings from Last 3 Encounters:  07/21/21 188 lb (85.3 kg)  07/06/21 186 lb (84.4 kg)  06/17/21 185 lb (83.9 kg)    Physical Exam Vitals and nursing note reviewed.  Constitutional:      General: She is not in acute distress.    Appearance: Normal appearance. She is obese. She is not ill-appearing, toxic-appearing or diaphoretic.  HENT:     Head: Normocephalic and atraumatic.     Nose: Nose normal.  Eyes:     Conjunctiva/sclera: Conjunctivae normal.     Pupils: Pupils are equal, round,  and reactive to light.  Neck:     Trachea: Tracheostomy present. No abnormal tracheal secretions.  Cardiovascular:     Rate and Rhythm: Normal rate and regular rhythm.     Heart sounds: Murmur heard.  Systolic murmur is present with a grade of 2/6.    No friction rub. No gallop.  Pulmonary:     Effort: Pulmonary effort is normal. No respiratory distress.     Breath sounds: Normal breath sounds. No stridor. No wheezing, rhonchi or rales.  Chest:     Chest wall: No tenderness.  Musculoskeletal:     Cervical back: Neck supple.     Right lower leg: No edema.     Left lower leg: No edema.  Skin:    General: Skin is warm and dry.     Capillary Refill: Capillary refill takes less than 2 seconds.  Neurological:     General: No focal deficit present.     Mental Status: She is alert and oriented to person, place, and time.  Psychiatric:        Mood and Affect: Mood normal.        Behavior: Behavior normal.        Thought Content: Thought content normal.        Judgment: Judgment normal.    Results for orders placed or performed in visit on 05/03/21  Urine Culture   Specimen: Urine   UR  Result Value Ref Range   Urine Culture, Routine Final report    Organism ID, Bacteria Comment   Microscopic Examination   Urine  Result Value Ref Range   WBC, UA None  seen 0 - 5 /hpf   RBC None seen 0 - 2 /hpf   Epithelial Cells (non renal) None seen 0 - 10 /hpf   Renal Epithel, UA None seen None seen /hpf   Bacteria, UA None seen None seen/Few  Urinalysis, Complete  Result Value Ref Range   Specific Gravity, UA <1.005 (L) 1.005 - 1.030   pH, UA 5.5 5.0 - 7.5   Color, UA Yellow Yellow   Appearance Ur Clear Clear   Leukocytes,UA Negative Negative   Protein,UA Negative Negative/Trace   Glucose, UA Negative Negative   Ketones, UA Negative Negative   RBC, UA Negative Negative   Bilirubin, UA Negative Negative   Urobilinogen, Ur 0.2 0.2 - 1.0 mg/dL   Nitrite, UA Negative Negative    Microscopic Examination See below:    *Note: Due to a large number of results and/or encounters for the requested time period, some results have not been displayed. A complete set of results can be found in Results Review.       Pertinent labs & imaging results that were available during my care of the patient were reviewed by me and considered in my medical decision making.  Assessment & Plan:  [name] was seen today for community acquired pneumonia.  Diagnoses and all orders for this visit:  Follow-up exam after treatment Community acquired pneumonia of right lower lobe of lung - resolved Treated for CAP with amoxil and azithromycin, prednisone, Mucinex, and tessalon. Has completed therapy with resolution of symptoms. Will repeat CXR in 4-6 weeks.     Continue all other maintenance medications.  Follow up plan: Return if symptoms worsen or fail to improve.   Continue healthy lifestyle choices, including diet (rich in fruits, vegetables, and lean proteins, and low in salt and simple carbohydrates) and exercise (at least 30 minutes of moderate physical activity daily).   The above assessment and management plan was discussed with the patient. The patient verbalized understanding of and has agreed to the management plan. Patient is aware to call the clinic if they develop any new symptoms or if symptoms persist or worsen. Patient is aware when to return to the clinic for a follow-up visit. Patient educated on when it is appropriate to go to the emergency department.   Monia Pouch, FNP-C [facility] [phone]

## 2021-08-06 ENCOUNTER — Ambulatory Visit (INDEPENDENT_AMBULATORY_CARE_PROVIDER_SITE_OTHER): Payer: Medicare Other

## 2021-08-06 DIAGNOSIS — E538 Deficiency of other specified B group vitamins: Secondary | ICD-10-CM

## 2021-08-06 MED ORDER — CYANOCOBALAMIN 1000 MCG/ML IJ SOLN
1000.0000 ug | Freq: Once | INTRAMUSCULAR | Status: AC
  Administered 2021-08-06: 1000 ug via INTRAMUSCULAR

## 2021-08-06 NOTE — Progress Notes (Signed)
Pt given Cyanocobalamin 1066mcgml IM on L-arm. Pt tol tx well.

## 2021-08-09 ENCOUNTER — Ambulatory Visit: Payer: Medicare Other | Admitting: Family Medicine

## 2021-08-10 ENCOUNTER — Ambulatory Visit (INDEPENDENT_AMBULATORY_CARE_PROVIDER_SITE_OTHER): Payer: Medicare Other

## 2021-08-10 DIAGNOSIS — I10 Essential (primary) hypertension: Secondary | ICD-10-CM

## 2021-08-10 DIAGNOSIS — K219 Gastro-esophageal reflux disease without esophagitis: Secondary | ICD-10-CM

## 2021-08-10 DIAGNOSIS — Z930 Tracheostomy status: Secondary | ICD-10-CM

## 2021-08-10 DIAGNOSIS — I480 Paroxysmal atrial fibrillation: Secondary | ICD-10-CM

## 2021-08-10 DIAGNOSIS — F411 Generalized anxiety disorder: Secondary | ICD-10-CM

## 2021-08-10 DIAGNOSIS — F339 Major depressive disorder, recurrent, unspecified: Secondary | ICD-10-CM

## 2021-08-10 NOTE — Patient Instructions (Addendum)
Visit Information  Patient Goals:  Protect My Health (Patient) Manage anxiety issues of client  Timeframe:  Short-Term Goal Priority:  Medium Progress:  On Track Start Date:     08/10/21                        Expected End Date:   11/01/21                   Follow Up Date    10/07/21 at 1:00 PM   Protect My Health (Patient)   Manage anxiety issues of client    Why is this important?   Screening tests can find diseases early when they are easier to treat.  Your doctor or nurse will talk with you about which tests are important for you.  Getting shots for common diseases like the flu and shingles will help prevent them.     Patient Self Care Activities:  Self administers medications as prescribed Attends all scheduled provider appointments Performs ADL's independently  Patient Coping Strengths:  Family Friends Spirituality Hopefulness  Patient Self Care Deficits:  Mobility issues Pain issues  Patient Goals:  - spend time or talk with others every day - practice relaxation or meditation daily - keep a calendar with appointment dates -talk regularly with spouse or with daughter about client needs  Follow Up Plan: [name] to call client or [name], daughter of client, on 10/07/21 at 1:00 PM to assess client needs    [name].Riot Waterworth MSW, [place] Holiday representative [facility] Management [phone]

## 2021-08-10 NOTE — Chronic Care Management (AMB) (Signed)
Chronic Care Management    Clinical Social Work Note  08/10/2021 Name: [name] MRN: [id number] DOB: Aug 14, 1940  [name] is a 81 y.o. year old female who is a primary care patient of [name], [name], MD. The CCM team was consulted to assist the patient with chronic disease management and/or care coordination needs related to: [name] .   Engaged with patient by telephone for follow up visit in response to provider referral for social work chronic care management and care coordination services.   Consent to Services:  The patient was given information about Chronic Care Management services, agreed to services, and gave verbal consent prior to initiation of services.  Please see initial visit note for detailed documentation.   Patient agreed to services and consent obtained.   Assessment: Review of patient past medical history, allergies, medications, and health status, including review of relevant consultants reports was performed today as part of a comprehensive evaluation and provision of chronic care management and care coordination services.     SDOH (Social Determinants of Health) assessments and interventions performed:  SDOH Interventions    Flowsheet Row Most Recent Value  SDOH Interventions   Physical Activity Interventions Other (Comments)  [has some walking challenges. she uses a cane to help her walk]  Stress Interventions Provide Counseling  [client has stress related to managing medical needs. she has stress related to managing finances]  Depression Interventions/Treatment  Counseling        Advanced Directives Status: See Vynca application for related entries.  CCM Care Plan  Allergies  Allergen Reactions   Lipitor [Atorvastatin Calcium] Other (See Comments)    myalgias   Lyrica [Pregabalin] Other (See Comments)    SORES IN MOUTH    Motrin [Ibuprofen] Other (See Comments)    Pt denies   Sibutramine Other (See Comments)    UNSPECIFIED  REACTION  UNSPECIFIED REACTION  Reaction unknown   Sibutramine Hcl Monohydrate Other (See Comments)    UNSPECIFIED REACTION    Alendronate Sodium Nausea Only   Latex Rash and Other (See Comments)    RA to latex 1982; includes bandaids     Levofloxacin Nausea And Vomiting   Meloxicam Other (See Comments)    Vaginal itching     Outpatient Encounter Medications as of 08/10/2021  Medication Sig Note   aspirin 81 MG chewable tablet Chew 81 mg by mouth at bedtime.     benzonatate (TESSALON PERLES) 100 MG capsule Take 2 capsules (200 mg total) by mouth 3 (three) times daily as needed for cough.    calcium carbonate (OS-CAL - DOSED IN MG OF ELEMENTAL CALCIUM) 1250 (500 Ca) MG tablet Take 1 tablet (500 mg of elemental calcium total) by mouth daily with breakfast.    Cholecalciferol (VITAMIN D3) 25 MCG (1000 UT) CAPS 2 capsules    clonazePAM (KLONOPIN) 0.25 MG disintegrating tablet DISSOLVE 1 TABLET BY MOUTH 3 TIMES A DAY    furosemide (LASIX) 20 MG tablet Take 10 mg by mouth daily. 10 mg po  Mon/Wed/Fridays 09/24/2020: Taking QD   hydrocortisone (ANUSOL-HC) 2.5 % rectal cream APPLY RECTALLY 2 TIMES A DAY AS NEEDED    ketoconazole (NIZORAL) 2 % cream Apply topically daily.    latanoprost (XALATAN) 0.005 % ophthalmic solution Place 1 drop into both eyes at bedtime.    levothyroxine (SYNTHROID) 88 MCG tablet Take by mouth.    linaclotide (LINZESS) 72 MCG capsule Take 1 capsule (72 mcg total) by mouth daily before breakfast.  loratadine (CLARITIN) 10 MG tablet TAKE 1 TABLET ONCE DAILY    magnesium oxide (MAG-OX) 400 (241.3 Mg) MG tablet Take 1 tablet (400 mg total) by mouth daily.    Multiple Vitamin (MULTIVITAMIN WITH MINERALS) TABS tablet Take 1 tablet by mouth daily.    potassium chloride (KLOR-CON) 10 MEQ tablet TAKE TWO TABLETS BY MOUTH TWICE DAILY    QUEtiapine (SEROQUEL) 50 MG tablet Take 1 tablet (50 mg total) by mouth at bedtime.    simvastatin (ZOCOR) 80 MG tablet Take 1 tablet (80 mg  total) by mouth daily.    triamcinolone (KENALOG) 0.1 % Apply topically 2 (two) times daily as needed.    venlafaxine (EFFEXOR) 75 MG tablet Take 75 mg by mouth daily.    No facility-administered encounter medications on file as of 08/10/2021.    Patient Active Problem List   Diagnosis Date Noted   History of papillary adenocarcinoma of thyroid 09/23/2020   Hypoparathyroidism ([place]) 09/23/2020   Hypothyroidism 05/15/2020   History of prosthetic aortic valve 10/15/2019   GAD (generalized anxiety disorder) 01/18/2019   Pure hypercholesterolemia 01/18/2019   Vitamin D deficiency 01/18/2019   Tracheostomy dependence ([place]) 07/19/2018   Bilateral vocal fold paralysis 07/19/2018   Posterior glottic stenosis 65/78/4696   Diastolic CHF ([place])    PAF (paroxysmal atrial fibrillation) ([place])    RVF (right ventricular failure) ([place])    S/P AVR 07/10/2017   Vitamin B 12 deficiency 03/01/2016   Ascending aortic aneurysm 08/20/2015   Thyroid cancer ([place]) 07/30/2015   Osteoporosis with fracture 04/16/2014   Depression, recurrent ([place]) 02/23/2010   GLAUCOMA 02/23/2010   CATARACTS 02/23/2010   RHEUMATIC FEVER 02/23/2010   Essential hypertension 02/23/2010   MITRAL VALVE PROLAPSE 02/23/2010   GERD 02/23/2010   Osteoarthritis 02/23/2010   Primary fibromyalgia syndrome 02/23/2010    Conditions to be addressed/monitored: monitor client management of anxiety issues  Care Plan : [name] care plan  Updates made by Katha Cabal, [name] since 08/10/2021 12:00 AM     Problem: Emotional Distress      Goal: Manage anxiety symptoms of client. Manage finances   Start Date: 08/10/2021  Expected End Date: 11/04/2021  This Visit's Progress: On track  Recent Progress: On track  Priority: Medium  Note:   Current Barriers:  Chronic Mental Health needs related to anxiety management Anxiety issues/Stress Challenges Mobility challenges Tracheostomy management Suicidal Ideation/Homicidal Ideation:  No  Clinical Social Work Goal(s):  patient will work with SW monthly by telephone or in person to reduce or manage symptoms related to anxiety and anxiety management patient will work with SW in next 30 days to address concerns related to mobility of client and related to ADLs completion of client Client will attend all scheduled medical appointments in next 30 days  Interventions: 1:1 collaboration with [name], [name], MD regarding development and update of comprehensive plan of care as evidenced by provider attestation and co-signature Discussed client needs with  [name]. Reviewed pain issues of client and reviewed sleeping issues of client Discussed tracheostomy care for [facility]  Maricel said that her daughter, Tomi Bamberger, is very supportive, related to tracheostomy care for client. Veyda spoke of supplies needed by client for tracheostomy care. Discussed ambulation needs of client. Zaylia said she uses a cane to help her walk. Discussed mood status of client. Danea feels that her mood is stable.  She has  good support from her spouse and other family members Encouraged  client or Tomi Bamberger to call [facility]  as needed for nursing support for client Encouraged client or Tomi Bamberger to call [name] as needed for social work support for client  Provided counseling support for client Discussed medication procurement for client  Patient Self Care Activities:  Self administers medications as prescribed Attends all scheduled provider appointments Performs ADL's independently  Patient Coping Strengths:  Family Friends Spirituality Hopefulness  Patient Self Care Deficits:  Mobility issues Pain issues  Patient Goals:  - spend time or talk with others every day - practice relaxation or meditation daily - keep a calendar with appointment dates -talk regularly with spouse or with daughter about client needs  Follow Up Plan: [name] to call client or her daughter, [name], on 10/07/21 at  1:00 PM to assess client needs.      [name].Bryona Foxworthy MSW, [place] representative [facility] Care Management [phone]

## 2021-08-19 ENCOUNTER — Other Ambulatory Visit: Payer: Self-pay | Admitting: Family Medicine

## 2021-08-24 DIAGNOSIS — I480 Paroxysmal atrial fibrillation: Secondary | ICD-10-CM

## 2021-08-24 DIAGNOSIS — I10 Essential (primary) hypertension: Secondary | ICD-10-CM

## 2021-08-24 DIAGNOSIS — F339 Major depressive disorder, recurrent, unspecified: Secondary | ICD-10-CM

## 2021-08-26 ENCOUNTER — Other Ambulatory Visit: Payer: Self-pay | Admitting: Family Medicine

## 2021-09-03 ENCOUNTER — Ambulatory Visit (INDEPENDENT_AMBULATORY_CARE_PROVIDER_SITE_OTHER): Payer: Medicare Other | Admitting: *Deleted

## 2021-09-03 DIAGNOSIS — E538 Deficiency of other specified B group vitamins: Secondary | ICD-10-CM | POA: Diagnosis not present

## 2021-09-03 MED ORDER — CYANOCOBALAMIN 1000 MCG/ML IJ SOLN
1000.0000 ug | INTRAMUSCULAR | Status: AC
  Administered 2021-09-03 – 2022-08-17 (×12): 1000 ug via INTRAMUSCULAR

## 2021-09-06 ENCOUNTER — Other Ambulatory Visit: Payer: Self-pay | Admitting: Family Medicine

## 2021-09-17 ENCOUNTER — Ambulatory Visit (INDEPENDENT_AMBULATORY_CARE_PROVIDER_SITE_OTHER): Payer: Medicare Other | Admitting: Family Medicine

## 2021-09-17 ENCOUNTER — Encounter: Payer: Self-pay | Admitting: Family Medicine

## 2021-09-17 VITALS — BP 111/66 | HR 90 | Ht 65.0 in | Wt 191.0 lb

## 2021-09-17 DIAGNOSIS — I10 Essential (primary) hypertension: Secondary | ICD-10-CM

## 2021-09-17 DIAGNOSIS — F411 Generalized anxiety disorder: Secondary | ICD-10-CM

## 2021-09-17 DIAGNOSIS — Z780 Asymptomatic menopausal state: Secondary | ICD-10-CM | POA: Diagnosis not present

## 2021-09-17 DIAGNOSIS — Z23 Encounter for immunization: Secondary | ICD-10-CM | POA: Diagnosis not present

## 2021-09-17 DIAGNOSIS — E890 Postprocedural hypothyroidism: Secondary | ICD-10-CM | POA: Diagnosis not present

## 2021-09-17 DIAGNOSIS — E7800 Pure hypercholesterolemia, unspecified: Secondary | ICD-10-CM

## 2021-09-17 DIAGNOSIS — F339 Major depressive disorder, recurrent, unspecified: Secondary | ICD-10-CM

## 2021-09-17 MED ORDER — CLONAZEPAM 0.25 MG PO TBDP: ORAL_TABLET | ORAL | 2 refills | Status: DC

## 2021-09-17 MED ORDER — POTASSIUM CHLORIDE ER 10 MEQ PO TBCR: 20.0000 meq | EXTENDED_RELEASE_TABLET | Freq: Two times a day (BID) | ORAL | 3 refills | Status: DC

## 2021-09-17 NOTE — Progress Notes (Signed)
? ?BP 111/66   Pulse 90   Ht _0  (1.651 m)   Wt 191 lb (86.6 kg)   SpO2 93%   BMI 31.78 kg/m?   ? ?Subjective:  ? ?Patient ID: [name], female    DOB: 07-12-1940, 81 y.o.   MRN: [id number] ? ?HPI: ?[name] is a 81 y.o. female presenting on 09/17/2021 for Medical Management of Chronic Issues, Hypertension, Anxiety, and Depression ? ? ?HPI ?Anxiety depression recheck ?Current rx-clonazepam 0.25 mg 3 times daily as needed, also takes Effexor ?# meds rx-90 ?Effectiveness of current meds-works well ?Adverse reactions form meds-none ? ?Pill count performed-No ?Last drug screen -09/18/2020 ?( high risk q85m moderate risk q678mlow risk yearly ) ?Urine drug screen today- No ?[place]yes ? If yes were their any concerning findings? -None ? ?No flowsheet data found. ? ? ?Controlled substance contract signed on: 09/19/2018 ? ?Hypertension ?Patient is currently on furosemide, and their blood pressure today is 111/66. Patient denies any lightheadedness or dizziness. Patient denies headaches, blurred vision, chest pains, shortness of breath, or weakness. Denies any side effects from medication and is content with current medication.  ? ?Hyperlipidemia ?Patient is coming in for recheck of his hyperlipidemia. The patient is currently taking simvastatin. They deny any issues with myalgias or history of liver damage from it. They deny any focal numbness or weakness or chest pain.  ? ?Hypothyroidism recheck ?Patient is coming in for thyroid recheck today as well. They deny any issues with hair changes or heat or cold problems or diarrhea or constipation. They deny any chest pain or palpitations. They are currently on levothyroxine 88 micrograms  ? ?Relevant past medical, surgical, family and social history reviewed and updated as indicated. Interim medical history since our last visit reviewed. ?Allergies and medications reviewed and updated. ? ?Review of Systems  ?Constitutional:  Negative for chills and  fever.  ?      ?Trach in place and seems to be doing well with that.  ?HENT:  Negative for congestion, ear discharge and ear pain.   ?Eyes:  Negative for redness and visual disturbance.  ?Respiratory:  Negative for chest tightness and shortness of breath.   ?Cardiovascular:  Negative for chest pain and leg swelling.  ?Genitourinary:  Negative for difficulty urinating and dysuria.  ?Musculoskeletal:  Negative for back pain and gait problem.  ?Skin:  Negative for rash.  ?Neurological:  Negative for dizziness, light-headedness and headaches.  ?Psychiatric/Behavioral:  Negative for agitation and behavioral problems.   ?All other systems reviewed and are negative. ? ?Per HPI unless specifically indicated above ? ? ?Allergies as of 09/17/2021   ? ?   Reactions  ? Lipitor [atorvastatin Calcium] Other (See Comments)  ? myalgias  ? Lyrica [pregabalin] Other (See Comments)  ? SORES IN MOUTH   ? Motrin [ibuprofen] Other (See Comments)  ? Pt denies  ? Sibutramine Other (See Comments)  ? UNSPECIFIED REACTION  ?UNSPECIFIED REACTION  ?Reaction unknown  ? Sibutramine Hcl Monohydrate Other (See Comments)  ? UNSPECIFIED REACTION   ? Alendronate Sodium Nausea Only  ? Latex Rash, Other (See Comments)  ? RA to latex 1982; includes bandaids   ? Levofloxacin Nausea And Vomiting  ? Meloxicam Other (See Comments)  ? Vaginal itching   ? ?  ? ?  ?Medication List  ?  ? ?  ? Accurate as of September 17, 2021 11:59 AM. If you have any questions, ask your nurse or doctor.  ?  ?  ? ?  ? ?  aspirin 81 MG chewable tablet ?Chew 81 mg by mouth at bedtime. ?  ?benzonatate 100 MG capsule ?Commonly known as: Best boy ?Take 2 capsules (200 mg total) by mouth 3 (three) times daily as needed for cough. ?  ?calcium carbonate 1250 (500 Ca) MG tablet ?Commonly known as: OS-CAL - dosed in mg of elemental calcium ?Take 1 tablet (500 mg of elemental calcium total) by mouth daily with breakfast. ?  ?clonazePAM 0.25 MG disintegrating tablet ?Commonly known as:  KLONOPIN ?DISSOLVE 1 TABLET BY MOUTH 3 TIMES A DAY ?  ?furosemide 20 MG tablet ?Commonly known as: LASIX ?Take 10 mg by mouth daily. 10 mg po  Mon/Wed/Fridays ?  ?hydrocortisone 2.5 % rectal cream ?Commonly known as: ANUSOL-HC ?APPLY RECTALLY 2 TIMES A DAY AS NEEDED ?  ?ketoconazole 2 % cream ?Commonly known as: NIZORAL ?Apply topically daily. ?  ?latanoprost 0.005 % ophthalmic solution ?Commonly known as: XALATAN ?Place 1 drop into both eyes at bedtime. ?  ?levothyroxine 88 MCG tablet ?Commonly known as: SYNTHROID ?Take by mouth. ?  ?linaclotide 72 MCG capsule ?Commonly known as: Linzess ?Take 1 capsule (72 mcg total) by mouth daily before breakfast. ?  ?loratadine 10 MG tablet ?Commonly known as: CLARITIN ?TAKE 1 TABLET ONCE DAILY ?  ?magnesium oxide 400 (241.3 Mg) MG tablet ?Commonly known as: MAG-OX ?Take 1 tablet (400 mg total) by mouth daily. ?  ?multivitamin with minerals Tabs tablet ?Take 1 tablet by mouth daily. ?  ?potassium chloride 10 MEQ tablet ?Commonly known as: KLOR-CON ?Take 2 tablets (20 mEq total) by mouth 2 (two) times daily. ?  ?QUEtiapine 50 MG tablet ?Commonly known as: SEROQUEL ?Take 1 tablet (50 mg total) by mouth at bedtime. ?  ?simvastatin 80 MG tablet ?Commonly known as: ZOCOR ?Take 1 tablet (80 mg total) by mouth daily. ?  ?triamcinolone cream 0.1 % ?Commonly known as: KENALOG ?Apply topically 2 (two) times daily as needed. ?  ?venlafaxine 75 MG tablet ?Commonly known as: EFFEXOR ?TAKE ONE TABLET EVERY DAY ?  ?Vitamin D3 25 MCG (1000 UT) Caps ?2 capsules ?  ? ?  ? ? ? ?Objective:  ? ?BP 111/66   Pulse 90   Ht _0  (1.651 m)   Wt 191 lb (86.6 kg)   SpO2 93%   BMI 31.78 kg/m?   ?Wt Readings from Last 3 Encounters:  ?09/17/21 191 lb (86.6 kg)  ?07/21/21 188 lb (85.3 kg)  ?07/06/21 186 lb (84.4 kg)  ?  ?Physical Exam ?Vitals and nursing note reviewed.  ?Constitutional:   ?   General: She is not in acute distress. ?   Appearance: She is well-developed. She is not diaphoretic.  ?    Comments: [name] in place and seems to be doing well with it  ?Eyes:  ?   Conjunctiva/sclera: Conjunctivae normal.  ?Cardiovascular:  ?   Rate and Rhythm: Normal rate and regular rhythm.  ?   Heart sounds: Normal heart sounds. No murmur heard. ?Pulmonary:  ?   Effort: Pulmonary effort is normal. No respiratory distress.  ?   Breath sounds: Normal breath sounds. No wheezing.  ?Musculoskeletal:     ?   General: No tenderness. Normal range of motion.  ?Skin: ?   General: Skin is warm and dry.  ?   Findings: No rash.  ?Neurological:  ?   Mental Status: She is alert and oriented to person, place, and time.  ?   Coordination: Coordination normal.  ?Psychiatric:     ?  Behavior: Behavior normal.  ? ?Sleeping and doing better with anxiety. ? ? ?Assessment & Plan:  ? ?Problem List Items Addressed This Visit   ? ?  ? Cardiovascular and Mediastinum  ? Essential hypertension - Primary  ? Relevant Medications  ? potassium chloride (KLOR-CON) 10 MEQ tablet  ? Other Relevant Orders  ? CBC with Differential/Platelet  ? CMP14+EGFR  ?  ? Endocrine  ? Hypothyroidism  ? Relevant Orders  ? TSH  ?  ? Other  ? Depression, recurrent ([place])  ? Relevant Medications  ? clonazePAM (KLONOPIN) 0.25 MG disintegrating tablet  ? GAD (generalized anxiety disorder)  ? Relevant Medications  ? clonazePAM (KLONOPIN) 0.25 MG disintegrating tablet  ? Other Relevant Orders  ? CBC with Differential/Platelet  ? Pure hypercholesterolemia  ? Relevant Orders  ? Lipid panel  ? ?Other Visit Diagnoses   ? ? Postmenopausal      ? Relevant Orders  ? DG [facility] DEXA  ? Need for shingles vaccine      ? Relevant Orders  ? Varicella-zoster vaccine IM (Shingrix)  ? ?  ?  ?Continue current medicine, no changes.  We will do blood work today ?Follow up plan: ?Return in about 3 months (around 12/18/2021), or if symptoms worsen or fail to improve, for Anxiety recheck and thyroid. ? ?Counseling provided for all of the vaccine components ?Orders Placed This Encounter  ?Procedures  ?  DG [facility] DEXA  ? Varicella-zoster vaccine IM (Shingrix)  ? CBC with Differential/Platelet  ? CMP14+EGFR  ? Lipid panel  ? TSH  ? ? ?Caryl Pina, MD ?[place] ?09/17/2021, 11:59 AM ? ? ? ?

## 2021-09-28 ENCOUNTER — Ambulatory Visit (INDEPENDENT_AMBULATORY_CARE_PROVIDER_SITE_OTHER): Payer: Medicare Other

## 2021-09-28 VITALS — Wt 191.0 lb

## 2021-09-28 DIAGNOSIS — Z780 Asymptomatic menopausal state: Secondary | ICD-10-CM | POA: Diagnosis not present

## 2021-09-28 DIAGNOSIS — Z0000 Encounter for general adult medical examination without abnormal findings: Secondary | ICD-10-CM | POA: Diagnosis not present

## 2021-09-28 DIAGNOSIS — R5381 Other malaise: Secondary | ICD-10-CM | POA: Diagnosis not present

## 2021-09-28 DIAGNOSIS — Z1231 Encounter for screening mammogram for malignant neoplasm of breast: Secondary | ICD-10-CM

## 2021-09-28 DIAGNOSIS — R269 Unspecified abnormalities of gait and mobility: Secondary | ICD-10-CM | POA: Diagnosis not present

## 2021-09-28 DIAGNOSIS — Z930 Tracheostomy status: Secondary | ICD-10-CM | POA: Diagnosis not present

## 2021-09-28 NOTE — Progress Notes (Signed)
? ?Subjective:  ? [name] is a 81 y.o. female who presents for Medicare Annual (Subsequent) preventive examination. ? ?Virtual Visit via Telephone Note ? ?I connected with  [name] on 09/28/21 at 11:15 AM EDT by telephone and verified that I am speaking with the correct person using two identifiers. ? ?Location: ?Patient: Home ?Provider: [facility] ?Persons participating in the virtual visit: patient/Nurse Health Advisor ?  ?I discussed the limitations, risks, security and privacy concerns of performing an evaluation and management service by telephone and the availability of in person appointments. The patient expressed understanding and agreed to proceed. ? ?Interactive audio and video telecommunications were attempted between this nurse and patient, however failed, due to patient having technical difficulties OR patient did not have access to video capability.  We continued and completed visit with audio only. ? ?Some vital signs may be absent or patient reported.  ? ?Ercel Normoyle Dionne Ano, LPN  ? ?Review of Systems    ? ?Cardiac Risk Factors include: advanced age (>40mn, >>28women);sedentary lifestyle;obesity (BMI >30kg/m2);dyslipidemia;hypertension;Other (see comment), Risk factor comments: A.Fib, CHF, hx of prosthetic aortic valve ? ?   ?Objective:  ?  ?Today's Vitals  ? 09/28/21 1115  ?Weight: 191 lb (86.6 kg)  ? ?Body mass index is 31.78 kg/m?. ? ? ?  09/28/2021  ? 11:28 AM 10/30/2020  ? 12:58 PM 09/24/2020  ?  4:06 PM 04/17/2019  ? 10:25 AM 04/12/2018  ?  2:54 PM 01/18/2018  ?  9:55 AM 12/27/2017  ?  4:15 PM  ?Advanced Directives  ?Does Patient Have a Medical Advance Directive? No No No No No No No  ?Would patient like information on creating a medical advance directive? No - Patient declined No - Patient declined No - Patient declined No - Patient declined No - Patient declined No - Patient declined No - Patient declined  ? ? ?Current Medications (verified) ?Outpatient Encounter Medications as of 09/28/2021   ?Medication Sig  ? aspirin 81 MG chewable tablet Chew 81 mg by mouth at bedtime.   ? benzonatate (TESSALON PERLES) 100 MG capsule Take 2 capsules (200 mg total) by mouth 3 (three) times daily as needed for cough.  ? calcium carbonate (OS-CAL - DOSED IN MG OF ELEMENTAL CALCIUM) 1250 (500 Ca) MG tablet Take 1 tablet (500 mg of elemental calcium total) by mouth daily with breakfast.  ? Cholecalciferol (VITAMIN D3) 25 MCG (1000 UT) CAPS 2 capsules  ? clonazePAM (KLONOPIN) 0.25 MG disintegrating tablet DISSOLVE 1 TABLET BY MOUTH 3 TIMES A DAY  ? furosemide (LASIX) 20 MG tablet Take 10 mg by mouth daily. 10 mg po  Mon/Wed/Fridays  ? hydrocortisone (ANUSOL-HC) 2.5 % rectal cream APPLY RECTALLY 2 TIMES A DAY AS NEEDED  ? ketoconazole (NIZORAL) 2 % cream Apply topically daily.  ? latanoprost (XALATAN) 0.005 % ophthalmic solution Place 1 drop into both eyes at bedtime.  ? levothyroxine (SYNTHROID) 88 MCG tablet Take by mouth.  ? linaclotide (LINZESS) 72 MCG capsule Take 1 capsule (72 mcg total) by mouth daily before breakfast.  ? loratadine (CLARITIN) 10 MG tablet TAKE 1 TABLET ONCE DAILY  ? magnesium oxide (MAG-OX) 400 (241.3 Mg) MG tablet Take 1 tablet (400 mg total) by mouth daily.  ? Multiple Vitamin (MULTIVITAMIN WITH MINERALS) TABS tablet Take 1 tablet by mouth daily.  ? potassium chloride (KLOR-CON) 10 MEQ tablet Take 2 tablets (20 mEq total) by mouth 2 (two) times daily.  ? QUEtiapine (SEROQUEL) 50 MG tablet Take 1 tablet (  50 mg total) by mouth at bedtime.  ? simvastatin (ZOCOR) 80 MG tablet Take 1 tablet (80 mg total) by mouth daily.  ? triamcinolone (KENALOG) 0.1 % Apply topically 2 (two) times daily as needed.  ? venlafaxine (EFFEXOR) 75 MG tablet TAKE ONE TABLET EVERY DAY  ? ?Facility-Administered Encounter Medications as of 09/28/2021  ?Medication  ? cyanocobalamin ((VITAMIN B-12)) injection 1,000 mcg  ? ? ?Allergies (verified) ?Lipitor [atorvastatin calcium], Lyrica [pregabalin], Motrin [ibuprofen], Sibutramine,  Sibutramine hcl monohydrate, Alendronate sodium, Latex, Levofloxacin, and Meloxicam  ? ?History: ?Past Medical History:  ?Diagnosis Date  ? ACL tear   ? right  Dr. Gladstone Pih   ? Adenomatous polyp   ? Anxiety   ? Arthritis   ? "all over" (04/12/2018)  ? Ascending aortic aneurysm ([place])   ? note per chart per Dr Lucianne Lei Tright 4.7 cm 04/15/2015   ? Asthma   ? B12 deficiency   ? Chronic bronchitis ([place])   ? Depression   ? DUB (dysfunctional uterine bleeding) 10/96  ? Fibromyalgia   ? GERD (gastroesophageal reflux disease)   ? Glaucoma   ? bilaterally  ? Helicobacter pylori (H. pylori)   ? Hiatal hernia   ? History of blood transfusion 06/2017  ? "related to [facility]"  ? History of kidney stones   ? History of left shoulder fracture   ? pt states fell off bed and broke ball in shoulder had rod placed   ? Hyperlipidemia   ? Hypertension   ? Hypothyroidism   ? Inspiratory stridor   ? Laryngeal stenosis   ? Occasional tremors   ? left arm   ? Osteoarthritis   ? Osteopenia   ? Osteoporosis   ? Thyroid cancer ([place]) 2017  ? Thyroid nodule   ? Tracheostomy in place [facility])   ? ?Past Surgical History:  ?Procedure Laterality Date  ? AORTIC VALVE REPLACEMENT N/A 07/10/2017  ? Procedure: AORTIC VALVE REPLACEMENT (AVR);  Surgeon: [name], Collier Salina, MD;  Location: [place];  Service: Open Heart Surgery;  Laterality: N/A;  ? APPENDECTOMY    ? BUNIONECTOMY  08/1999  ? right - Dr. Irving Shows   ? CARDIAC VALVE REPLACEMENT    ? CATARACT EXTRACTION, BILATERAL Bilateral 05/28/07-2009  ? "right-left"  ? CHOLECYSTECTOMY OPEN  1979  ? DILATION AND CURETTAGE OF UTERUS  03/31/95  ? Dr. Ovid Curd   ? DIRECT LARYNGOSCOPY WITH BOTOX INJECTION N/A 12/01/2017  ? Procedure: SUSPENDED DIRECT MICROLARYNGOSCOPY WITH KENALOG INJECTION, CO2 LASER;  Surgeon: Melida Quitter, MD;  Location: [place];  Service: ENT;  Laterality: N/A;  ? EXCISIONAL HEMORRHOIDECTOMY    ? EXPLORATION POST OPERATIVE OPEN HEART N/A 07/11/2017  ? Procedure: EXPLORATION POST OPERATIVE OPEN HEART;  Surgeon: Ivin Poot, MD;  Location: [place];  Service: Open Heart Surgery;  Laterality: N/A;  ? EYE SURGERY    ? also had left cataract removed   ? FRACTURE SURGERY    ? HEMATOMA EVACUATION N/A 07/11/2017  ? Procedure: EVACUATION HEMATOMA;  Surgeon: Ivin Poot, MD;  Location: [place];  Service: Open Heart Surgery;  Laterality: N/A;  ? HERNIA REPAIR    ? JOINT REPLACEMENT    ? MICROLARYNGOSCOPY WITH LASER AND BALLOON DILATION N/A 01/24/2018  ? Procedure: MICROLARYNGOSCOPY WITH LASER AND BALLOON DILATION;  Surgeon: Melida Quitter, MD;  Location: [place];  Service: ENT;  Laterality: N/A;  ? MICROLARYNGOSCOPY WITH LASER AND BALLOON DILATION N/A 04/13/2018  ? Procedure: MICROLARYNGOSCOPY WITH BALLOON DILATION;  Surgeon: Melida Quitter, MD;  Location: [facility];  Service: ENT;  Laterality: N/A;  ? NEPHROLITHOTOMY Left 04/07/2017  ? Procedure: NEPHROLITHOTOMY PERCUTANEOUS WITH SURGEON ACCESS;  Surgeon: Ardis Hughs, MD;  Location[facility];  Service: Urology;  Laterality: Left;  ? ORIF HUMERUS FRACTURE  05/30/2011  ? Procedure: OPEN REDUCTION INTERNAL FIXATION (ORIF) PROXIMAL HUMERUS FRACTURE;  Surgeon: Augustin Schooling;  Location: [place];  Service: Orthopedics;  Laterality: Left;  open reduction internal fixation of proximal humerus fracture  ? PLACEMENT OF CENTRIMAG VENTRICULAR ASSIST DEVICE Right 07/11/2017  ? Procedure: PLACEMENT OF CENTRIMAG VENTRICULAR ASSIST DEVICE;  Surgeon: Ivin Poot, MD;  Location: [place];  Service: Open Heart Surgery;  Laterality: Right;  ? REMOVAL OF CENTRIMAG VENTRICULAR ASSIST DEVICE N/A 07/17/2017  ? Procedure: REMOVAL OF RVAD WITH PUMP STANDBY;  Surgeon: Ivin Poot, MD;  Location: [place];  Service: Open Heart Surgery;  Laterality: N/A;  ? REPLACEMENT ASCENDING AORTA N/A 07/10/2017  ? Procedure: REPLACEMENT ASCENDING AORTA;  Surgeon: Ivin Poot, MD;  Location: [place];  Service: Open Heart Surgery;  Laterality: N/A;  ? RIGHT/LEFT HEART CATH AND CORONARY ANGIOGRAPHY N/A 06/13/2017  ? Procedure:  RIGHT/LEFT HEART CATH AND CORONARY ANGIOGRAPHY;  Surgeon: Jolaine Artist, MD;  Location: [facility];  Service: Cardiovascular;  Laterality: N/A;  ? TEE WITHOUT CARDIOVERSION N/A 07/10/2017  ? Proced

## 2021-09-28 NOTE — Patient Instructions (Signed)
[name] , ?Thank you for taking time to come for your Medicare Wellness Visit. I appreciate your ongoing commitment to your health goals. Please review the following plan we discussed and let me know if I can assist you in the future.  ? ?Screening recommendations/referrals: ?Colonoscopy: no longer required ?Mammogram: Done 2018 - recommend repeat annually - ordered today - schedule with mobile unit soon ?Bone Density: Done 07/10/2019 - Repeat every 2 years *appointment today at 2 ?Recommended yearly ophthalmology/optometry visit for glaucoma screening and checkup ?Recommended yearly dental visit for hygiene and checkup ? ?Vaccinations: ?Influenza vaccine: Done 03/18/2021 - Repeat annually ?Pneumococcal vaccine: Done 09/06/2007 & 11/15/2013 ?Tdap vaccine: Done 01/17/2014 ?Shingles vaccine: Done 09/13/2020 & 09/17/2021   ?Covid-19:Done 07/16/2019, 08/13/2019, 09/30/2019, 10/21/2019, & 05/06/2020 ? ?Advanced directives: Advance directive discussed with you today. Even though you declined this today, please call our office should you change your mind, and we can give you the proper paperwork for you to fill out.  ? ?Conditions/risks identified: Aim for 30 minutes of exercise  - break this up into 5-10 minutes at a time if you need to, 6-8 glasses of water, and 5 servings of fruits and vegetables each day.  ? ?Next appointment: Follow up in one year for your annual wellness visit  ? ? ?Preventive Care 37 Years and Older, Female ?Preventive care refers to lifestyle choices and visits with your health care provider that can promote health and wellness. ?What does preventive care include? ?A yearly physical exam. This is also called an annual well check. ?Dental exams once or twice a year. ?Routine eye exams. Ask your health care provider how often you should have your eyes checked. ?Personal lifestyle choices, including: ?Daily care of your teeth and gums. ?Regular physical activity. ?Eating a healthy diet. ?Avoiding tobacco and  drug use. ?Limiting alcohol use. ?Practicing safe sex. ?Taking low-dose aspirin every day. ?Taking vitamin and mineral supplements as recommended by your health care provider. ?What happens during an annual well check? ?The services and screenings done by your health care provider during your annual well check will depend on your age, overall health, lifestyle risk factors, and family history of disease. ?Counseling  ?Your health care provider may ask you questions about your: ?Alcohol use. ?Tobacco use. ?Drug use. ?Emotional well-being. ?Home and relationship well-being. ?Sexual activity. ?Eating habits. ?History of falls. ?Memory and ability to understand (cognition). ?Work and work Statistician. ?Reproductive health. ?Screening  ?You may have the following tests or measurements: ?Height, weight, and BMI. ?Blood pressure. ?Lipid and cholesterol levels. These may be checked every 5 years, or more frequently if you are over 63 years old. ?Skin check. ?Lung cancer screening. You may have this screening every year starting at age 49 if you have a 30-pack-year history of smoking and currently smoke or have quit within the past 15 years. ?Fecal occult blood test (FOBT) of the stool. You may have this test every year starting at age 75. ?Flexible sigmoidoscopy or colonoscopy. You may have a sigmoidoscopy every 5 years or a colonoscopy every 10 years starting at age 20. ?Hepatitis C blood test. ?Hepatitis B blood test. ?Sexually transmitted disease (STD) testing. ?Diabetes screening. This is done by checking your blood sugar (glucose) after you have not eaten for a while (fasting). You may have this done every 1-3 years. ?Bone density scan. This is done to screen for osteoporosis. You may have this done starting at age 8. ?Mammogram. This may be done every 1-2 years. Talk to your  health care provider about how often you should have regular mammograms. ?Talk with your health care provider about your test results, treatment  options, and if necessary, the need for more tests. ?Vaccines  ?Your health care provider may recommend certain vaccines, such as: ?Influenza vaccine. This is recommended every year. ?Tetanus, diphtheria, and acellular pertussis (Tdap, Td) vaccine. You may need a Td booster every 10 years. ?Zoster vaccine. You may need this after age 68. ?Pneumococcal 13-valent conjugate (PCV13) vaccine. One dose is recommended after age 22. ?Pneumococcal polysaccharide (PPSV23) vaccine. One dose is recommended after age 47. ?Talk to your health care provider about which screenings and vaccines you need and how often you need them. ?This information is not intended to replace advice given to you by your health care provider. Make sure you discuss any questions you have with your health care provider. ?Document Released: 07/10/2015 Document Revised: 03/02/2016 Document Reviewed: 04/14/2015 ?Elsevier Interactive Patient Education ? 2017 [place]. ? ?Fall Prevention in the Home ?Falls can cause injuries. They can happen to people of all ages. There are many things you can do to make your home safe and to help prevent falls. ?What can I do on the outside of my home? ?Regularly fix the edges of walkways and driveways and fix any cracks. ?Remove anything that might make you trip as you walk through a door, such as a raised step or threshold. ?Trim any bushes or trees on the path to your home. ?Use bright outdoor lighting. ?Clear any walking paths of anything that might make someone trip, such as rocks or tools. ?Regularly check to see if handrails are loose or broken. Make sure that both sides of any steps have handrails. ?Any raised decks and porches should have guardrails on the edges. ?Have any leaves, snow, or ice cleared regularly. ?Use sand or salt on walking paths during winter. ?Clean up any spills in your garage right away. This includes oil or grease spills. ?What can I do in the bathroom? ?Use night lights. ?Install grab  bars by the toilet and in the tub and shower. Do not use towel bars as grab bars. ?Use non-skid mats or decals in the tub or shower. ?If you need to sit down in the shower, use a plastic, non-slip stool. ?Keep the floor dry. Clean up any water that spills on the floor as soon as it happens. ?Remove soap buildup in the tub or shower regularly. ?Attach bath mats securely with double-sided non-slip rug tape. ?Do not have throw rugs and other things on the floor that can make you trip. ?What can I do in the bedroom? ?Use night lights. ?Make sure that you have a light by your bed that is easy to reach. ?Do not use any sheets or blankets that are too big for your bed. They should not hang down onto the floor. ?Have a firm chair that has side arms. You can use this for support while you get dressed. ?Do not have throw rugs and other things on the floor that can make you trip. ?What can I do in the kitchen? ?Clean up any spills right away. ?Avoid walking on wet floors. ?Keep items that you use a lot in easy-to-reach places. ?If you need to reach something above you, use a strong step stool that has a grab bar. ?Keep electrical cords out of the way. ?Do not use floor polish or wax that makes floors slippery. If you must use wax, use non-skid floor wax. ?Do not have  throw rugs and other things on the floor that can make you trip. ?What can I do with my stairs? ?Do not leave any items on the stairs. ?Make sure that there are handrails on both sides of the stairs and use them. Fix handrails that are broken or loose. Make sure that handrails are as long as the stairways. ?Check any carpeting to make sure that it is firmly attached to the stairs. Fix any carpet that is loose or worn. ?Avoid having throw rugs at the top or bottom of the stairs. If you do have throw rugs, attach them to the floor with carpet tape. ?Make sure that you have a light switch at the top of the stairs and the bottom of the stairs. If you do not have them,  ask someone to add them for you. ?What else can I do to help prevent falls? ?Wear shoes that: ?Do not have high heels. ?Have rubber bottoms. ?Are comfortable and fit you well. ?Are closed at the toe. D

## 2021-09-29 DIAGNOSIS — M810 Age-related osteoporosis without current pathological fracture: Secondary | ICD-10-CM | POA: Diagnosis not present

## 2021-09-29 DIAGNOSIS — Z780 Asymptomatic menopausal state: Secondary | ICD-10-CM | POA: Diagnosis not present

## 2021-09-29 DIAGNOSIS — M85851 Other specified disorders of bone density and structure, right thigh: Secondary | ICD-10-CM | POA: Diagnosis not present

## 2021-10-04 ENCOUNTER — Ambulatory Visit (INDEPENDENT_AMBULATORY_CARE_PROVIDER_SITE_OTHER): Payer: Medicare Other | Admitting: Family Medicine

## 2021-10-04 DIAGNOSIS — E538 Deficiency of other specified B group vitamins: Secondary | ICD-10-CM | POA: Diagnosis not present

## 2021-10-06 ENCOUNTER — Telehealth: Payer: Self-pay

## 2021-10-06 NOTE — Chronic Care Management (AMB) (Signed)
?  Care Management  ? ?Note ? ?10/06/2021 ?Name: [name] MRN: [id number] DOB: October 13, 1940 ? ?[name] is a 81 y.o. year old female who is a primary care patient of [name], [name], MD and is actively engaged with the care management team. I reached out to [name] by phone today to assist with re-scheduling a follow up visit with the Licensed Clinical Social Worker ? ?Follow up plan: ?Unsuccessful telephone outreach attempt made. A HIPAA compliant phone message was left for the patient providing contact information and requesting a return call.  ?The care management team will reach out to the patient again over the next 7 days.  ?If patient returns call to provider office, please advise to call [place]  at [phone] ? ?Noreene Larsson, RMA ?Care Guide, Embedded Care Coordination ?[facility]  Care Management  ?[place], [place] [address] ?Direct Dial: [phone] ?Museum/gallery [email or web address] ?Website: [email or web address]  ? ?

## 2021-10-07 ENCOUNTER — Telehealth: Payer: Medicare Other

## 2021-10-12 DIAGNOSIS — Z930 Tracheostomy status: Secondary | ICD-10-CM | POA: Diagnosis not present

## 2021-10-12 DIAGNOSIS — J386 Stenosis of larynx: Secondary | ICD-10-CM | POA: Diagnosis not present

## 2021-10-18 ENCOUNTER — Ambulatory Visit
Admission: RE | Admit: 2021-10-18 | Discharge: 2021-10-18 | Disposition: A | Discharge status: Home / Self Care | Payer: Medicare Other | Source: Ambulatory Visit | Attending: Family Medicine | Admitting: Family Medicine

## 2021-10-18 DIAGNOSIS — Z1231 Encounter for screening mammogram for malignant neoplasm of breast: Secondary | ICD-10-CM | POA: Diagnosis not present

## 2021-10-21 NOTE — Chronic Care Management (AMB) (Signed)
?  Care Management  ? ?Note ? ?10/21/2021 ?Name: [name] MRN: [id number] DOB: August 13, 1940 ? ?[name] is a 81 y.o. year old female who is a primary care patient of [name], [name], MD and is actively engaged with the care management team. I reached out to [name] by phone today to assist with re-scheduling a follow up visit with the Licensed Clinical Social Worker ? ?Follow up plan: ?Unsuccessful telephone outreach attempt made. A HIPAA compliant phone message was left for the patient providing contact information and requesting a return call.  ?The care management team will reach out to the patient again over the next 7 days.  ?If patient returns call to provider office, please advise to call [place]  at [phone] ? ?Noreene Larsson, RMA ?Care Guide, Embedded Care Coordination ?[facility]  Care Management  ?[place], [place] [address] ?Direct Dial: [phone] ?Museum/gallery conservator.[email or web address] ?Website: [email or web address]  ? ?

## 2021-10-22 NOTE — Chronic Care Management (AMB) (Signed)
?  Care Management  ? ?Note ? ?10/22/2021 ?Name: [name] MRN: [id number] DOB: [birth year] ? ?[name] is a 81 y.o. year old female who is a primary care patient of [name], [name], MD and is actively engaged with the care management team. I reached out to [name] by phone today to assist with re-scheduling a follow up visit with the Licensed Clinical Social Worker ? ?Follow up plan: ?Telephone appointment with care management team member scheduled for:11/02/2021 ? ?Noreene Larsson, RMA ?Care Guide, Embedded Care Coordination ?[facility]  Care Management  ?[place], [place] [address] ?Direct Dial: [phone] ?Museum/gallery conservator.[email or web address] ?Website: [email or web address]  ? ?

## 2021-10-27 ENCOUNTER — Other Ambulatory Visit: Payer: Self-pay | Admitting: Family Medicine

## 2021-11-02 ENCOUNTER — Telehealth: Payer: Medicare Other

## 2021-11-03 ENCOUNTER — Ambulatory Visit (INDEPENDENT_AMBULATORY_CARE_PROVIDER_SITE_OTHER): Payer: Medicare Other | Admitting: Emergency Medicine

## 2021-11-03 DIAGNOSIS — E538 Deficiency of other specified B group vitamins: Secondary | ICD-10-CM

## 2021-11-15 DIAGNOSIS — Z930 Tracheostomy status: Secondary | ICD-10-CM | POA: Diagnosis not present

## 2021-11-15 DIAGNOSIS — R269 Unspecified abnormalities of gait and mobility: Secondary | ICD-10-CM | POA: Diagnosis not present

## 2021-11-16 DIAGNOSIS — H40033 Anatomical narrow angle, bilateral: Secondary | ICD-10-CM | POA: Diagnosis not present

## 2021-11-16 DIAGNOSIS — H04123 Dry eye syndrome of bilateral lacrimal glands: Secondary | ICD-10-CM | POA: Diagnosis not present

## 2021-11-23 DIAGNOSIS — H401111 Primary open-angle glaucoma, right eye, mild stage: Secondary | ICD-10-CM | POA: Diagnosis not present

## 2021-11-23 DIAGNOSIS — H40022 Open angle with borderline findings, high risk, left eye: Secondary | ICD-10-CM | POA: Diagnosis not present

## 2021-12-01 DIAGNOSIS — K649 Unspecified hemorrhoids: Secondary | ICD-10-CM | POA: Diagnosis not present

## 2021-12-01 DIAGNOSIS — K5900 Constipation, unspecified: Secondary | ICD-10-CM | POA: Diagnosis not present

## 2021-12-06 ENCOUNTER — Ambulatory Visit (INDEPENDENT_AMBULATORY_CARE_PROVIDER_SITE_OTHER): Payer: Medicare Other | Admitting: *Deleted

## 2021-12-06 DIAGNOSIS — E538 Deficiency of other specified B group vitamins: Secondary | ICD-10-CM

## 2021-12-09 ENCOUNTER — Other Ambulatory Visit: Payer: Self-pay

## 2021-12-09 ENCOUNTER — Telehealth: Payer: Self-pay | Admitting: Family Medicine

## 2021-12-09 MED ORDER — LACTULOSE 20 GM/30ML PO SOLN: 30.0000 mL | Freq: Every day | ORAL | 0 refills | Status: DC

## 2021-12-09 MED ORDER — HYDROCORTISONE (PERIANAL) 2.5 % EX CREA: TOPICAL_CREAM | CUTANEOUS | 2 refills | Status: DC

## 2021-12-09 NOTE — Telephone Encounter (Signed)
Refills sent to [facility]

## 2021-12-20 ENCOUNTER — Ambulatory Visit (INDEPENDENT_AMBULATORY_CARE_PROVIDER_SITE_OTHER): Payer: Medicare Other | Admitting: Family Medicine

## 2021-12-20 ENCOUNTER — Encounter: Payer: Self-pay | Admitting: Family Medicine

## 2021-12-20 VITALS — BP 126/70 | HR 88 | Temp 98.0°F | Ht 65.0 in | Wt 181.0 lb

## 2021-12-20 DIAGNOSIS — S81802A Unspecified open wound, left lower leg, initial encounter: Secondary | ICD-10-CM

## 2021-12-20 DIAGNOSIS — E7800 Pure hypercholesterolemia, unspecified: Secondary | ICD-10-CM | POA: Diagnosis not present

## 2021-12-20 DIAGNOSIS — I10 Essential (primary) hypertension: Secondary | ICD-10-CM | POA: Diagnosis not present

## 2021-12-20 DIAGNOSIS — E890 Postprocedural hypothyroidism: Secondary | ICD-10-CM

## 2021-12-20 DIAGNOSIS — F339 Major depressive disorder, recurrent, unspecified: Secondary | ICD-10-CM

## 2021-12-20 DIAGNOSIS — F411 Generalized anxiety disorder: Secondary | ICD-10-CM | POA: Diagnosis not present

## 2021-12-20 MED ORDER — CLONAZEPAM 0.25 MG PO TBDP: ORAL_TABLET | ORAL | 2 refills | Status: DC

## 2021-12-20 NOTE — Progress Notes (Signed)
BP 126/70   Pulse 88   Temp 98 F (36.7 C)   Ht 5\' 5"  (1.651 m)   Wt 181 lb (82.1 kg)   SpO2 98%   BMI 30.12 kg/m    Subjective:   Patient ID: [name], female    DOB: 29-Dec-1940, 81 y.o.   MRN: [id number]  HPI: [name] is a 81 y.o. female presenting on 12/20/2021 for Medical Management of Chronic Issues, Anxiety, Depression, and leg wound (LLE)   HPI Hypertension Patient is currently on furosemide, and their blood pressure today is 126/70. Patient denies any lightheadedness or dizziness. Patient denies headaches, blurred vision, chest pains, shortness of breath, or weakness. Denies any side effects from medication and is content with current medication.   Hyperlipidemia Patient is coming in for recheck of his hyperlipidemia. The patient is currently taking simvastatin. They deny any issues with myalgias or history of liver damage from it. They deny any focal numbness or weakness or chest pain.   Hypothyroidism recheck Patient is coming in for thyroid recheck today as well. They deny any issues with hair changes or heat or cold problems or diarrhea or constipation. They deny any chest pain or palpitations. They are currently on levothyroxine 88 micrograms   Anxiety and depression Current rx-clonazepam 0.25 3 times daily as needed # meds rx- 90 Effectiveness of current meds-works well, still has some anxiety but mostly under control along with the Effexor as well. Adverse reactions form meds-none  Pill count performed-No Last drug screen -09/18/2020 ( high risk q23m, moderate risk q84m, low risk yearly ) Urine drug screen today- Yes Was the [place]yes  If yes were their any concerning findings? -None  No flowsheet data found.   Controlled substance contract signed on: Today  Relevant past medical, surgical, family and social history reviewed and updated as indicated. Interim medical history since our last visit reviewed. Allergies and medications  reviewed and updated.  Review of Systems  Constitutional:  Negative for chills and fever.  Eyes:  Negative for redness and visual disturbance.  Respiratory:  Negative for chest tightness and shortness of breath.   Cardiovascular:  Negative for chest pain and leg swelling.  Musculoskeletal:  Negative for back pain and gait problem.  Skin:  Negative for rash.  Neurological:  Negative for dizziness, light-headedness and headaches.  Psychiatric/Behavioral:  Positive for dysphoric mood. Negative for agitation, behavioral problems, self-injury, sleep disturbance and suicidal ideas. The patient is nervous/anxious.   All other systems reviewed and are negative.   Per HPI unless specifically indicated above   Allergies as of 12/20/2021       Reactions   Lipitor [atorvastatin Calcium] Other (See Comments)   myalgias   Lyrica [pregabalin] Other (See Comments)   SORES IN MOUTH    Motrin [ibuprofen] Other (See Comments)   Pt denies   Sibutramine Other (See Comments)   UNSPECIFIED REACTION  UNSPECIFIED REACTION  Reaction unknown   Sibutramine Hcl Monohydrate Other (See Comments)   UNSPECIFIED REACTION    Alendronate Sodium Nausea Only   Latex Rash, Other (See Comments)   RA to latex 1982; includes bandaids    Levofloxacin Nausea And Vomiting   Meloxicam Other (See Comments)   Vaginal itching         Medication List        Accurate as of December 20, 2021 12:17 PM. If you have any questions, ask your nurse or doctor.  aspirin 81 MG chewable tablet Chew 81 mg by mouth at bedtime.   benzonatate 100 MG capsule Commonly known as: Tessalon Perles Take 2 capsules (200 mg total) by mouth 3 (three) times daily as needed for cough.   calcium carbonate 1250 (500 Ca) MG tablet Commonly known as: OS-CAL - dosed in mg of elemental calcium Take 1 tablet (500 mg of elemental calcium total) by mouth daily with breakfast.   clonazePAM 0.25 MG disintegrating tablet Commonly known  as: KLONOPIN DISSOLVE 1 TABLET BY MOUTH 3 TIMES A DAY   docusate sodium 250 MG capsule Commonly known as: COLACE Take 250 mg by mouth daily.   furosemide 20 MG tablet Commonly known as: LASIX Take 10 mg by mouth daily. 10 mg po  Mon/Wed/Fridays   hydrocortisone 2.5 % rectal cream Commonly known as: ANUSOL-HC APPLY RECTALLY 2 TIMES A DAY AS NEEDED   hydrocortisone 25 MG suppository Commonly known as: ANUSOL-HC Place rectally.   ketoconazole 2 % cream Commonly known as: NIZORAL Apply topically daily.   Lactulose 20 GM/30ML Soln Take 30 mLs (20 g total) by mouth daily.   latanoprost 0.005 % ophthalmic solution Commonly known as: XALATAN Place 1 drop into both eyes at bedtime.   levothyroxine 88 MCG tablet Commonly known as: SYNTHROID Take by mouth.   linaclotide 72 MCG capsule Commonly known as: Linzess Take 1 capsule (72 mcg total) by mouth daily before breakfast.   loratadine 10 MG tablet Commonly known as: CLARITIN TAKE 1 TABLET ONCE DAILY   magnesium oxide 400 (241.3 Mg) MG tablet Commonly known as: MAG-OX Take 1 tablet (400 mg total) by mouth daily.   multivitamin with minerals Tabs tablet Take 1 tablet by mouth daily.   potassium chloride 10 MEQ tablet Commonly known as: KLOR-CON Take 2 tablets (20 mEq total) by mouth 2 (two) times daily.   QUEtiapine 50 MG tablet Commonly known as: SEROQUEL Take 1 tablet (50 mg total) by mouth at bedtime.   simvastatin 80 MG tablet Commonly known as: ZOCOR TAKE ONE TABLET EVERY DAY   triamcinolone cream 0.1 % Commonly known as: KENALOG Apply topically 2 (two) times daily as needed.   venlafaxine 75 MG tablet Commonly known as: EFFEXOR TAKE ONE TABLET EVERY DAY   Vitamin D3 25 MCG (1000 UT) Caps 2 capsules         Objective:   BP 126/70   Pulse 88   Temp 98 F (36.7 C)   Ht 5\' 5"  (1.651 m)   Wt 181 lb (82.1 kg)   SpO2 98%   BMI 30.12 kg/m   Wt Readings from Last 3 Encounters:  12/20/21 181 lb  (82.1 kg)  09/28/21 191 lb (86.6 kg)  09/17/21 191 lb (86.6 kg)    Physical Exam Vitals and nursing note reviewed.  Constitutional:      General: She is not in acute distress.    Appearance: She is well-developed. She is not diaphoretic.  Eyes:     Conjunctiva/sclera: Conjunctivae normal.  Cardiovascular:     Rate and Rhythm: Normal rate and regular rhythm.     Heart sounds: Normal heart sounds. No murmur heard. Pulmonary:     Effort: Pulmonary effort is normal. No respiratory distress.     Breath sounds: Normal breath sounds. No wheezing.  Musculoskeletal:        General: No swelling or tenderness. Normal range of motion.  Skin:    General: Skin is warm and dry.     Findings: Wound (Small  puncture wound on posterior left calf, no erythema or surrounding warmth, no induration.  No discharge currently.) present. No rash.  Neurological:     Mental Status: She is alert and oriented to person, place, and time.     Coordination: Coordination normal.  Psychiatric:        Behavior: Behavior normal.       Assessment & Plan:   Problem List Items Addressed This Visit       Cardiovascular and Mediastinum   Essential hypertension - Primary   Relevant Orders   CBC with Differential/Platelet   CMP14+EGFR   Lipid panel   TSH     Endocrine   Hypothyroidism   Relevant Orders   CBC with Differential/Platelet   CMP14+EGFR   Lipid panel   TSH     Other   Depression, recurrent (HCC)   Relevant Medications   clonazePAM (KLONOPIN) 0.25 MG disintegrating tablet   Other Relevant Orders   ToxASSURE Select 13 (MW), Urine   GAD (generalized anxiety disorder)   Relevant Medications   clonazePAM (KLONOPIN) 0.25 MG disintegrating tablet   Other Relevant Orders   ToxASSURE Select 13 (MW), Urine   Pure hypercholesterolemia   Relevant Orders   CBC with Differential/Platelet   CMP14+EGFR   Lipid panel   TSH   Other Visit Diagnoses     Wound of left lower extremity, initial  encounter           Left leg wound, recommended that they use antibiotic cream and simple bandage and then Ace wrap for compression and change daily.  No signs of infection currently so let us know if there are any that develop. Follow up plan: Return in about 3 months (around 03/22/2022), or if symptoms worsen or fail to improve, for Thyroid and anxiety recheck.  Counseling provided for all of the vaccine components Orders Placed This Encounter  Procedures   CBC with Differential/Platelet   CMP14+EGFR   Lipid panel   TSH   ToxASSURE Select 13 (MW), Urine    Arville Care, MD [facility] 12/20/2021, 12:17 PM

## 2021-12-21 LAB — CBC WITH DIFFERENTIAL/PLATELET
Basophils Absolute: 0 10*3/uL (ref 0.0–0.2)
Basos: 0 %
EOS (ABSOLUTE): 0.2 10*3/uL (ref 0.0–0.4)
Eos: 3 %
Hematocrit: 36.4 % (ref 34.0–46.6)
Hemoglobin: 11.8 g/dL (ref 11.1–15.9)
Immature Grans (Abs): 0 10*3/uL (ref 0.0–0.1)
Immature Granulocytes: 0 %
Lymphocytes Absolute: 1.1 10*3/uL (ref 0.7–3.1)
Lymphs: 19 %
MCH: 28.9 pg (ref 26.6–33.0)
MCHC: 32.4 g/dL (ref 31.5–35.7)
MCV: 89 fL (ref 79–97)
Monocytes Absolute: 0.5 10*3/uL (ref 0.1–0.9)
Monocytes: 8 %
Neutrophils Absolute: 4 10*3/uL (ref 1.4–7.0)
Neutrophils: 70 %
Platelets: 188 10*3/uL (ref 150–450)
RBC: 4.09 x10E6/uL (ref 3.77–5.28)
RDW: 13 % (ref 11.7–15.4)
WBC: 5.8 10*3/uL (ref 3.4–10.8)

## 2021-12-21 LAB — CMP14+EGFR
ALT: 14 IU/L (ref 0–32)
AST: 24 IU/L (ref 0–40)
Albumin/Globulin Ratio: 1.9 (ref 1.2–2.2)
Albumin: 4.3 g/dL (ref 3.7–4.7)
Alkaline Phosphatase: 114 IU/L (ref 44–121)
BUN/Creatinine Ratio: 13 (ref 12–28)
BUN: 13 mg/dL (ref 8–27)
Bilirubin Total: 0.4 mg/dL (ref 0.0–1.2)
CO2: 25 mmol/L (ref 20–29)
Calcium: 8.7 mg/dL (ref 8.7–10.3)
Chloride: 104 mmol/L (ref 96–106)
Creatinine, Ser: 0.97 mg/dL (ref 0.57–1.00)
Globulin, Total: 2.3 g/dL (ref 1.5–4.5)
Glucose: 84 mg/dL (ref 70–99)
Potassium: 4.1 mmol/L (ref 3.5–5.2)
Sodium: 144 mmol/L (ref 134–144)
Total Protein: 6.6 g/dL (ref 6.0–8.5)
eGFR: 59 mL/min/{1.73_m2} — ABNORMAL LOW (ref >59)

## 2021-12-21 LAB — LIPID PANEL
Chol/HDL Ratio: 2.2 ratio (ref 0.0–4.4)
Cholesterol, Total: 139 mg/dL (ref 100–199)
HDL: 64 mg/dL (ref >39)
LDL Chol Calc (NIH): 58 mg/dL (ref 0–99)
Triglycerides: 88 mg/dL (ref 0–149)
VLDL Cholesterol Cal: 17 mg/dL (ref 5–40)

## 2021-12-21 LAB — TSH: TSH: 0.483 u[IU]/mL (ref 0.450–4.500)

## 2021-12-24 LAB — TOXASSURE SELECT 13 (MW), URINE

## 2021-12-27 ENCOUNTER — Other Ambulatory Visit: Payer: Self-pay | Admitting: Family Medicine

## 2022-01-05 ENCOUNTER — Other Ambulatory Visit: Payer: Self-pay

## 2022-01-05 ENCOUNTER — Ambulatory Visit (INDEPENDENT_AMBULATORY_CARE_PROVIDER_SITE_OTHER): Payer: Medicare Other

## 2022-01-05 DIAGNOSIS — E538 Deficiency of other specified B group vitamins: Secondary | ICD-10-CM

## 2022-01-05 MED ORDER — LACTULOSE 20 GM/30ML PO SOLN: 30.0000 mL | Freq: Every day | ORAL | 1 refills | Status: DC

## 2022-01-05 NOTE — Progress Notes (Signed)
Cyanocobalamin injection given to right deltoid.  Patient tolerated well. 

## 2022-01-15 ENCOUNTER — Other Ambulatory Visit: Payer: Self-pay | Admitting: Family Medicine

## 2022-01-15 DIAGNOSIS — I10 Essential (primary) hypertension: Secondary | ICD-10-CM

## 2022-01-24 ENCOUNTER — Other Ambulatory Visit: Payer: Self-pay | Admitting: Family Medicine

## 2022-02-03 DIAGNOSIS — Z930 Tracheostomy status: Secondary | ICD-10-CM | POA: Diagnosis not present

## 2022-02-03 DIAGNOSIS — J386 Stenosis of larynx: Secondary | ICD-10-CM | POA: Diagnosis not present

## 2022-02-07 ENCOUNTER — Ambulatory Visit (INDEPENDENT_AMBULATORY_CARE_PROVIDER_SITE_OTHER): Payer: Medicare Other

## 2022-02-07 DIAGNOSIS — E538 Deficiency of other specified B group vitamins: Secondary | ICD-10-CM | POA: Diagnosis not present

## 2022-02-07 NOTE — Progress Notes (Signed)
Cyanocobalamin injection given to left deltoid.  Patient tolerated well. 

## 2022-03-07 ENCOUNTER — Other Ambulatory Visit: Payer: Self-pay | Admitting: Family Medicine

## 2022-03-10 ENCOUNTER — Ambulatory Visit (INDEPENDENT_AMBULATORY_CARE_PROVIDER_SITE_OTHER): Payer: Medicare Other

## 2022-03-10 DIAGNOSIS — E538 Deficiency of other specified B group vitamins: Secondary | ICD-10-CM | POA: Diagnosis not present

## 2022-03-10 NOTE — Progress Notes (Signed)
B12 injection given and tolerated well.  

## 2022-03-23 ENCOUNTER — Encounter: Payer: Self-pay | Admitting: Family Medicine

## 2022-03-23 ENCOUNTER — Ambulatory Visit (INDEPENDENT_AMBULATORY_CARE_PROVIDER_SITE_OTHER): Payer: Medicare Other | Admitting: Family Medicine

## 2022-03-23 VITALS — BP 119/68 | HR 91 | Temp 97.5°F | Ht 65.0 in | Wt 174.0 lb

## 2022-03-23 DIAGNOSIS — I10 Essential (primary) hypertension: Secondary | ICD-10-CM

## 2022-03-23 DIAGNOSIS — Z23 Encounter for immunization: Secondary | ICD-10-CM

## 2022-03-23 DIAGNOSIS — F339 Major depressive disorder, recurrent, unspecified: Secondary | ICD-10-CM | POA: Diagnosis not present

## 2022-03-23 DIAGNOSIS — F411 Generalized anxiety disorder: Secondary | ICD-10-CM | POA: Diagnosis not present

## 2022-03-23 DIAGNOSIS — E890 Postprocedural hypothyroidism: Secondary | ICD-10-CM | POA: Diagnosis not present

## 2022-03-23 MED ORDER — SIMVASTATIN 80 MG PO TABS: 80.0000 mg | ORAL_TABLET | Freq: Every day | ORAL | 3 refills | Status: DC

## 2022-03-23 MED ORDER — CLONAZEPAM 0.25 MG PO TBDP: 0.2500 mg | ORAL_TABLET | Freq: Three times a day (TID) | ORAL | 2 refills | Status: DC | PRN

## 2022-03-23 NOTE — Progress Notes (Signed)
BP 119/68   Pulse 91   Temp (!) 97.5 F (36.4 C)   Ht _0  (1.651 m)   Wt 174 lb (78.9 kg)   SpO2 97%   BMI 28.96 kg/m    Subjective:   Patient ID: [name], female    DOB: 10/20/1940, 81 y.o.   MRN: [id number]  HPI: [name] is a 81 y.o. female presenting on 03/23/2022 for Medical Management of Chronic Issues, Hypertension, Anxiety, History of thyroid cancer, and bug bites (Upper body)   HPI Anxiety depression recheck Current rx-clonazepam 0.25 3 times daily as needed, patient also on Seroquel # meds rx-90 Effectiveness of current meds-looks well Adverse reactions form meds-none  Pill count performed-No Last drug screen -12/27/2021 ( high risk q27m moderate risk q637mlow risk yearly ) [place] today- No [place]  If [place] were their any concerning findings? -None  No flowsheet data found.   Controlled substance contract signed on: 12/27/2021  Hypertension Patient is currently on furosemide, and their blood pressure today is 119/68. Patient denies any lightheadedness or dizziness. Patient denies headaches, blurred vision, chest pains, shortness of breath, or weakness. Denies any side effects from medication and is content with current medication.   Hypothyroidism recheck with history of thyroid cancer Patient is coming in for thyroid recheck today as well. They deny any issues with hair changes or heat or cold problems or diarrhea or constipation. They deny any chest pain or palpitations. They are currently on levothyroxine 88 micrograms   Relevant past medical, surgical, family and social history reviewed and updated as indicated. Interim medical history since our last visit reviewed. Allergies and medications reviewed and updated.  Review of Systems  Constitutional:  Negative for chills and fever.  Eyes:  Negative for visual disturbance.  Respiratory:  Negative for chest tightness and shortness of breath.   Cardiovascular:   Negative for chest pain and leg swelling.  Musculoskeletal:  Negative for back pain and gait problem.  Skin:  Negative for rash.  Neurological:  Negative for dizziness, light-headedness and headaches.  Psychiatric/Behavioral:  Negative for agitation and behavioral problems.   All other systems reviewed and are negative.   Per HPI unless specifically indicated above   Allergies as of 03/23/2022       Reactions   Lipitor [atorvastatin Calcium] Other (See Comments)   myalgias   Lyrica [pregabalin] Other (See Comments)   SORES IN MOUTH    Motrin [ibuprofen] Other (See Comments)   Pt denies   Sibutramine Other (See Comments)   UNSPECIFIED REACTION  UNSPECIFIED REACTION  Reaction unknown   Sibutramine Hcl Monohydrate Other (See Comments)   UNSPECIFIED REACTION    Alendronate Sodium Nausea Only   Latex Rash, Other (See Comments)   RA to latex 1982; includes bandaids    Levofloxacin Nausea And Vomiting   Meloxicam Other (See Comments)   Vaginal itching         Medication List        Accurate as of March 23, 2022 11:36 AM. If you have any questions, ask your nurse or doctor.          Anucort-HC 25 MG suppository Generic drug: hydrocortisone INSERT 1 SUPPOSITORY RECTALLY TWICE DAILY   aspirin 81 MG chewable tablet Chew 81 mg by mouth at bedtime.   benzonatate 100 MG capsule Commonly known as: Tessalon Perles Take 2 capsules (200 mg total) by mouth 3 (three) times daily as needed for cough.  calcium carbonate 1250 (500 Ca) MG tablet Commonly known as: OS-CAL - dosed in mg of elemental calcium Take 1 tablet (500 mg of elemental calcium total) by mouth daily with breakfast.   clonazePAM 0.25 MG disintegrating tablet Commonly known as: KLONOPIN Take 1 tablet (0.25 mg total) by mouth 3 (three) times daily as needed for seizure. DISSOLVE 1 TABLET BY MOUTH 3 TIMES A DAY Start taking on: April 05, 2022 What changed:  how much to take how to take this when to  take this reasons to take this These instructions start on April 05, 2022. If you are unsure what to do until then, ask your doctor or other care provider. Changed by: [name] Frady Taddeo, MD   docusate sodium 250 MG capsule Commonly known as: COLACE Take 250 mg by mouth daily.   furosemide 20 MG tablet Commonly known as: LASIX Take 10 mg by mouth daily. 10 mg po  Mon/Wed/Fridays   hydrocortisone 2.5 % rectal cream Commonly known as: ANUSOL-HC APPLY RECTALLY 2 TIMES A DAY AS NEEDED   ketoconazole 2 % cream Commonly known as: NIZORAL Apply topically daily.   Lactulose 20 GM/30ML Soln Take 30 mLs (20 g total) by mouth daily.   latanoprost 0.005 % ophthalmic solution Commonly known as: XALATAN Place 1 drop into both eyes at bedtime.   levothyroxine 88 MCG tablet Commonly known as: SYNTHROID Take by mouth.   linaclotide 72 MCG capsule Commonly known as: Linzess Take 1 capsule (72 mcg total) by mouth daily before breakfast.   loratadine 10 MG tablet Commonly known as: CLARITIN TAKE 1 TABLET ONCE DAILY   magnesium oxide 400 (241.3 Mg) MG tablet Commonly known as: MAG-OX Take 1 tablet (400 mg total) by mouth daily.   multivitamin with minerals Tabs tablet Take 1 tablet by mouth daily.   potassium chloride 10 MEQ tablet Commonly known as: KLOR-CON TAKE 2 TABLETS 2 TIMES A DAY   QUEtiapine 50 MG tablet Commonly known as: SEROQUEL Take 1 tablet (50 mg total) by mouth at bedtime.   simvastatin 80 MG tablet Commonly known as: ZOCOR Take 1 tablet (80 mg total) by mouth daily.   triamcinolone cream 0.1 % Commonly known as: KENALOG Apply topically 2 (two) times daily as needed.   venlafaxine 75 MG tablet Commonly known as: EFFEXOR TAKE ONE TABLET EVERY DAY   Vitamin D3 25 MCG (1000 UT) Caps 2 capsules         Objective:   BP 119/68   Pulse 91   Temp (!) 97.5 F (36.4 C)   Ht _0  (1.651 m)   Wt 174 lb (78.9 kg)   SpO2 97%   BMI 28.96 kg/m   Wt  Readings from Last 3 Encounters:  03/23/22 174 lb (78.9 kg)  12/20/21 181 lb (82.1 kg)  09/28/21 191 lb (86.6 kg)    Physical Exam Vitals and nursing note reviewed.  Constitutional:      General: She is not in acute distress.    Appearance: She is well-developed. She is not diaphoretic.     Comments: Tracheostomy in place  Eyes:     Conjunctiva/sclera: Conjunctivae normal.  Cardiovascular:     Rate and Rhythm: Normal rate and regular rhythm.     Heart sounds: Normal heart sounds. No murmur heard. Pulmonary:     Effort: Pulmonary effort is normal. No respiratory distress.     Breath sounds: Normal breath sounds. No wheezing.  Musculoskeletal:        General: No  tenderness. Normal range of motion.  Skin:    General: Skin is warm and dry.     Findings: Rash (2 small papules on neck breast and right arm, consistent with insect bite) present.  Neurological:     Mental Status: She is alert and oriented to person, place, and time.     Coordination: Coordination normal.  Psychiatric:        Behavior: Behavior normal.       Assessment & Plan:   Problem List Items Addressed This Visit       Cardiovascular and Mediastinum   Essential hypertension - Primary   Relevant Medications   simvastatin (ZOCOR) 80 MG tablet   Other Relevant Orders   CBC with Differential/Platelet   CMP14+EGFR   Lipid panel   TSH     Endocrine   Hypothyroidism   Relevant Orders   TSH     Other   Depression, recurrent (HCC)   Relevant Medications   clonazePAM (KLONOPIN) 0.25 MG disintegrating tablet (Start on 04/05/2022)   Other Relevant Orders   CBC with Differential/Platelet   GAD (generalized anxiety disorder)   Relevant Medications   clonazePAM (KLONOPIN) 0.25 MG disintegrating tablet (Start on 04/05/2022)  Continue current medicine, sent refills for her, no changes.  Follow up plan: Return in about 3 months (around 06/22/2022), or if symptoms worsen or fail to improve, for Anxiety and  hypertension and thyroid.  Counseling provided for all of the vaccine components Orders Placed This Encounter  Procedures   CBC with Differential/Platelet   CMP14+EGFR   Lipid panel   TSH    Caryl Pina, MD [facility] 03/23/2022, 11:36 AM

## 2022-03-24 LAB — CBC WITH DIFFERENTIAL/PLATELET
Basophils Absolute: 0 10*3/uL (ref 0.0–0.2)
Basos: 1 %
EOS (ABSOLUTE): 0.2 10*3/uL (ref 0.0–0.4)
Eos: 3 %
Hematocrit: 38.9 % (ref 34.0–46.6)
Hemoglobin: 13 g/dL (ref 11.1–15.9)
Immature Grans (Abs): 0 10*3/uL (ref 0.0–0.1)
Immature Granulocytes: 0 %
Lymphocytes Absolute: 1.1 10*3/uL (ref 0.7–3.1)
Lymphs: 22 %
MCH: 29.4 pg (ref 26.6–33.0)
MCHC: 33.4 g/dL (ref 31.5–35.7)
MCV: 88 fL (ref 79–97)
Monocytes Absolute: 0.5 10*3/uL (ref 0.1–0.9)
Monocytes: 9 %
Neutrophils Absolute: 3.2 10*3/uL (ref 1.4–7.0)
Neutrophils: 65 %
Platelets: 213 10*3/uL (ref 150–450)
RBC: 4.42 x10E6/uL (ref 3.77–5.28)
RDW: 13.1 % (ref 11.7–15.4)
WBC: 5.1 10*3/uL (ref 3.4–10.8)

## 2022-03-24 LAB — TSH: TSH: 0.796 u[IU]/mL (ref 0.450–4.500)

## 2022-03-24 LAB — CMP14+EGFR
ALT: 18 IU/L (ref 0–32)
AST: 33 IU/L (ref 0–40)
Albumin/Globulin Ratio: 2.2 (ref 1.2–2.2)
Albumin: 4.6 g/dL (ref 3.8–4.8)
Alkaline Phosphatase: 112 IU/L (ref 44–121)
BUN/Creatinine Ratio: 13 (ref 12–28)
BUN: 14 mg/dL (ref 8–27)
Bilirubin Total: 0.4 mg/dL (ref 0.0–1.2)
CO2: 29 mmol/L (ref 20–29)
Calcium: 9.2 mg/dL (ref 8.7–10.3)
Chloride: 101 mmol/L (ref 96–106)
Creatinine, Ser: 1.07 mg/dL — ABNORMAL HIGH (ref 0.57–1.00)
Globulin, Total: 2.1 g/dL (ref 1.5–4.5)
Glucose: 65 mg/dL — ABNORMAL LOW (ref 70–99)
Potassium: 4.3 mmol/L (ref 3.5–5.2)
Sodium: 144 mmol/L (ref 134–144)
Total Protein: 6.7 g/dL (ref 6.0–8.5)
eGFR: 53 mL/min/{1.73_m2} — ABNORMAL LOW (ref >59)

## 2022-03-24 LAB — LIPID PANEL
Chol/HDL Ratio: 2.2 ratio (ref 0.0–4.4)
Cholesterol, Total: 165 mg/dL (ref 100–199)
HDL: 76 mg/dL (ref >39)
LDL Chol Calc (NIH): 69 mg/dL (ref 0–99)
Triglycerides: 114 mg/dL (ref 0–149)
VLDL Cholesterol Cal: 20 mg/dL (ref 5–40)

## 2022-04-11 ENCOUNTER — Ambulatory Visit (INDEPENDENT_AMBULATORY_CARE_PROVIDER_SITE_OTHER): Payer: Medicare Other

## 2022-04-11 DIAGNOSIS — E538 Deficiency of other specified B group vitamins: Secondary | ICD-10-CM

## 2022-04-11 NOTE — Progress Notes (Signed)
Cyanocobalamin injection given to left deltoid.  Patient tolerated well. 

## 2022-04-25 DIAGNOSIS — R269 Unspecified abnormalities of gait and mobility: Secondary | ICD-10-CM | POA: Diagnosis not present

## 2022-04-25 DIAGNOSIS — Z930 Tracheostomy status: Secondary | ICD-10-CM | POA: Diagnosis not present

## 2022-05-05 DIAGNOSIS — E890 Postprocedural hypothyroidism: Secondary | ICD-10-CM | POA: Diagnosis not present

## 2022-05-05 DIAGNOSIS — Z85850 Personal history of malignant neoplasm of thyroid: Secondary | ICD-10-CM | POA: Diagnosis not present

## 2022-05-09 DIAGNOSIS — J386 Stenosis of larynx: Secondary | ICD-10-CM | POA: Diagnosis not present

## 2022-05-09 DIAGNOSIS — Z930 Tracheostomy status: Secondary | ICD-10-CM | POA: Diagnosis not present

## 2022-05-12 ENCOUNTER — Ambulatory Visit (INDEPENDENT_AMBULATORY_CARE_PROVIDER_SITE_OTHER): Payer: Medicare Other | Admitting: *Deleted

## 2022-05-12 DIAGNOSIS — E538 Deficiency of other specified B group vitamins: Secondary | ICD-10-CM

## 2022-05-17 NOTE — Progress Notes (Unsigned)
Cardiology Office Note   Date:  05/18/2022   ID:  TELESHA DEGUZMAN, DOB [birth year], MRN [id number]  PCP:  [name], [name], MD  Cardiologist:   None   No chief complaint on file.    History of Present Illness: [name] is a 81 y.o. female who presents for follow up after AVR and aortic surgery.  She was admitted in Jan 2019 for elective AVR and ascending root replacement.  Post op she had tamponade and VF arrest.  She required an RVAD.    She had a tracheostomy placed and did go to rehab.   Follow up echo demonstrated a normal EF.   There was moderate TR.  The AVR looked OK.    Since I last saw her she has had no new complaints.  She does a lot of activity around the house.  The patient denies any new symptoms such as chest discomfort, neck or arm discomfort. There has been no new shortness of breath, PND or orthopnea. There have been no reported palpitations, presyncope or syncope.   Past Medical History:  Diagnosis Date   ACL tear    right  Dr. Gladstone Pih    Adenomatous polyp    Anxiety    Arthritis    "all over" (04/12/2018)   Ascending aortic aneurysm ([place])    note per chart per Dr Lucianne Lei Tright 4.7 cm 04/15/2015    Asthma    B12 deficiency    Chronic bronchitis ([address])    Depression    DUB (dysfunctional uterine bleeding) 10/96   Fibromyalgia    GERD (gastroesophageal reflux disease)    Glaucoma    bilaterally   Helicobacter pylori (H. pylori)    Hiatal hernia    History of blood transfusion 06/2017   "related to OHS"   History of kidney stones    History of left shoulder fracture    pt states fell off bed and broke ball in shoulder had rod placed    Hyperlipidemia    Hypertension    Hypothyroidism    Inspiratory stridor    Laryngeal stenosis    Occasional tremors    left arm    Osteoarthritis    Osteopenia    Osteoporosis    Thyroid cancer ([place]) 2017   Thyroid nodule    Tracheostomy in place [facility])     Past Surgical History:  Procedure  Laterality Date   AORTIC VALVE REPLACEMENT N/A 07/10/2017   Procedure: AORTIC VALVE REPLACEMENT (AVR);  Surgeon: [name], Collier Salina, MD;  Location: [place];  Service: Open Heart Surgery;  Laterality: N/A;   APPENDECTOMY     BUNIONECTOMY  08/1999   right - Dr. Irving Shows    CARDIAC VALVE REPLACEMENT     CATARACT EXTRACTION, BILATERAL Bilateral 05/28/07-2009   "right-left"   [place]  03/31/95   Dr. Ovid Curd    DIRECT LARYNGOSCOPY WITH BOTOX INJECTION N/A 12/01/2017   Procedure: SUSPENDED DIRECT MICROLARYNGOSCOPY WITH KENALOG INJECTION, CO2 LASER;  Surgeon: Melida Quitter, MD;  Location: [place];  Service: ENT;  Laterality: N/A;   EXCISIONAL HEMORRHOIDECTOMY     EXPLORATION POST OPERATIVE OPEN HEART N/A 07/11/2017   Procedure: EXPLORATION POST OPERATIVE OPEN HEART;  Surgeon: Ivin Poot, MD;  Location: [place];  Service: Open Heart Surgery;  Laterality: N/A;   EYE SURGERY     also had left cataract removed    FRACTURE SURGERY  HEMATOMA EVACUATION N/A 07/11/2017   Procedure: EVACUATION HEMATOMA;  Surgeon: Ivin Poot, MD;  Location: [place];  Service: Open Heart Surgery;  Laterality: N/A;   HERNIA REPAIR     JOINT REPLACEMENT     MICROLARYNGOSCOPY WITH LASER AND BALLOON DILATION N/A 01/24/2018   Procedure: MICROLARYNGOSCOPY WITH LASER AND BALLOON DILATION;  Surgeon: Melida Quitter, MD;  Location: [place];  Service: ENT;  Laterality: N/A;   MICROLARYNGOSCOPY WITH LASER AND BALLOON DILATION N/A 04/13/2018   Procedure: MICROLARYNGOSCOPY WITH BALLOON DILATION;  Surgeon: Melida Quitter, MD;  Location: [facility];  Service: ENT;  Laterality: N/A;   NEPHROLITHOTOMY Left 04/07/2017   Procedure: NEPHROLITHOTOMY PERCUTANEOUS WITH SURGEON ACCESS;  Surgeon: Ardis Hughs, MD;  Location[facility];  Service: Urology;  Laterality: Left;   ORIF HUMERUS FRACTURE  05/30/2011   Procedure: OPEN REDUCTION INTERNAL FIXATION (ORIF) PROXIMAL HUMERUS FRACTURE;  Surgeon: Augustin Schooling;  Location: [place];  Service: Orthopedics;  Laterality: Left;  open reduction internal fixation of proximal humerus fracture   PLACEMENT OF CENTRIMAG VENTRICULAR ASSIST DEVICE Right 07/11/2017   Procedure: PLACEMENT OF CENTRIMAG VENTRICULAR ASSIST DEVICE;  Surgeon: Ivin Poot, MD;  Location: [place];  Service: Open Heart Surgery;  Laterality: Right;   REMOVAL OF CENTRIMAG VENTRICULAR ASSIST DEVICE N/A 07/17/2017   Procedure: REMOVAL OF RVAD WITH PUMP STANDBY;  Surgeon: Ivin Poot, MD;  Location: [place];  Service: Open Heart Surgery;  Laterality: N/A;   REPLACEMENT ASCENDING AORTA N/A 07/10/2017   Procedure: REPLACEMENT ASCENDING AORTA;  Surgeon: Ivin Poot, MD;  Location: [place];  Service: Open Heart Surgery;  Laterality: N/A;   RIGHT/LEFT HEART CATH AND CORONARY ANGIOGRAPHY N/A 06/13/2017   Procedure: RIGHT/LEFT HEART CATH AND CORONARY ANGIOGRAPHY;  Surgeon: Jolaine Artist, MD;  Location: [place] [facility];  Service: Cardiovascular;  Laterality: N/A;   TEE WITHOUT CARDIOVERSION N/A 07/10/2017   Procedure: TRANSESOPHAGEAL ECHOCARDIOGRAM (TEE);  Surgeon: [name], Collier Salina, MD;  Location: [place];  Service: Open Heart Surgery;  Laterality: N/A;   TEE WITHOUT CARDIOVERSION  07/11/2017   Procedure: TRANSESOPHAGEAL ECHOCARDIOGRAM (TEE);  Surgeon: [name], Collier Salina, MD;  Location: [place];  Service: Open Heart Surgery;;   TEE WITHOUT CARDIOVERSION N/A 07/17/2017   Procedure: TRANSESOPHAGEAL ECHOCARDIOGRAM (TEE);  Surgeon: [name], Collier Salina, MD;  Location: [place];  Service: Open Heart Surgery;  Laterality: N/A;   THYROID LOBECTOMY Left 07/27/2015   Procedure: LEFT THYROID LOBECTOMY;  Surgeon: Fanny Skates, MD;  [facility];  Service: General;  Laterality: Left;   THYROIDECTOMY N/A 08/06/2015   Procedure: RIGHT THYROID LOBECTOMY, REIMPLANTATION PARATHYROID;  Surgeon: Fanny Skates, MD;  [facility];  Service: General;  Laterality: N/A;   TOTAL KNEE ARTHROPLASTY Right 08/2009   Dr.  Gladstone Pih   TRACHEOSTOMY  08/08/2017   VENTRAL HERNIA REPAIR  2/99     Current Outpatient Medications  Medication Sig Dispense Refill   ANUCORT-HC 25 MG suppository INSERT 1 SUPPOSITORY RECTALLY TWICE DAILY 14 suppository 0   aspirin 81 MG chewable tablet Chew 81 mg by mouth at bedtime.      benzonatate (TESSALON PERLES) 100 MG capsule Take 2 capsules (200 mg total) by mouth 3 (three) times daily as needed for cough. 20 capsule 0   calcium carbonate (OS-CAL - DOSED IN MG OF ELEMENTAL CALCIUM) 1250 (500 Ca) MG tablet Take 1 tablet (500 mg of elemental calcium total) by mouth daily with breakfast. 30 tablet 0   Cholecalciferol (VITAMIN D3) 25 MCG (  1000 UT) CAPS 2 capsules     clonazePAM (KLONOPIN) 0.25 MG disintegrating tablet Take 1 tablet (0.25 mg total) by mouth 3 (three) times daily as needed for seizure. DISSOLVE 1 TABLET BY MOUTH 3 TIMES A DAY 90 tablet 2   docusate sodium (COLACE) 250 MG capsule Take 250 mg by mouth daily.     furosemide (LASIX) 20 MG tablet Take 10 mg by mouth daily. 10 mg po  Mon/Wed/Fridays     hydrocortisone (ANUSOL-HC) 2.5 % rectal cream APPLY RECTALLY 2 TIMES A DAY AS NEEDED 30 g 2   ketoconazole (NIZORAL) 2 % cream Apply topically daily. 15 g 0   Lactulose 20 GM/30ML SOLN Take 30 mLs (20 g total) by mouth daily. 450 mL 1   latanoprost (XALATAN) 0.005 % ophthalmic solution Place 1 drop into both eyes at bedtime. 2.5 mL 12   levothyroxine (SYNTHROID) 100 MCG tablet Take 100 mcg by mouth daily.     linaclotide (LINZESS) 72 MCG capsule Take 1 capsule (72 mcg total) by mouth daily before breakfast. 30 capsule 5   loratadine (CLARITIN) 10 MG tablet TAKE 1 TABLET ONCE DAILY 30 tablet 11   magnesium oxide (MAG-OX) 400 (241.3 Mg) MG tablet Take 1 tablet (400 mg total) by mouth daily. 30 tablet 0   Multiple Vitamin (MULTIVITAMIN WITH MINERALS) TABS tablet Take 1 tablet by mouth daily.     potassium chloride (KLOR-CON) 10 MEQ tablet TAKE 2 TABLETS 2 TIMES A DAY 120 tablet 5    QUEtiapine (SEROQUEL) 50 MG tablet Take 1 tablet (50 mg total) by mouth at bedtime. 90 tablet 3   simvastatin (ZOCOR) 80 MG tablet Take 1 tablet (80 mg total) by mouth daily. 90 tablet 3   triamcinolone (KENALOG) 0.1 % Apply topically 2 (two) times daily as needed.     venlafaxine (EFFEXOR) 75 MG tablet TAKE ONE TABLET EVERY DAY 90 tablet 1   Current Facility-Administered Medications  Medication Dose Route Frequency Provider Last Rate Last Admin   cyanocobalamin ((VITAMIN B-12)) injection 1,000 mcg  1,000 mcg Intramuscular Q30 days [name], [name], MD   1,000 mcg at 05/12/22 1130    Allergies:   Lipitor [atorvastatin calcium], Lyrica [pregabalin], Motrin [ibuprofen], Sibutramine, Sibutramine hcl monohydrate, Alendronate sodium, Latex, Levofloxacin, and Meloxicam    ROS:  Please see the history of present illness.   Otherwise, review of systems are positive for tremors when she is anxious.   All other systems are reviewed and negative.    PHYSICAL EXAM: VS:  BP 118/74   Pulse 83   Ht '5\' 5"'$  (1.651 m)   Wt 175 lb (79.4 kg)   SpO2 95%   BMI 29.12 kg/m  , BMI Body mass index is 29.12 kg/m.  GENERAL:  Well appearing NECK:  No jugular venous distention, waveform within normal limits, carotid upstroke brisk and symmetric, no bruits, no thyromegaly, tracheostomy in place LUNGS:  Clear to auscultation bilaterally CHEST:  Unremarkable HEART:  PMI not displaced or sustained,S1 and S2 within normal limits, no S3, no S4, no clicks, no rubs, 3 of 6 apical systolic murmur radiating slightly at the aortic outflow tract, no diastolic murmurs ABD:  Flat, positive bowel sounds normal in frequency in pitch, no bruits, no rebound, no guarding, no midline pulsatile mass, no hepatomegaly, no splenomegaly EXT:  2 plus pulses throughout, no edema, no cyanosis no clubbing   EKG:  EKG is  ordered today. Sinus rhythm, rate 82, right bundle branch block, left anterior fascicular block,  no acute ST-T wave  changes.  Premature atrial contractions  Recent Labs: 03/23/2022: ALT 18; BUN 14; Creatinine, Ser 1.07; Hemoglobin 13.0; Platelets 213; Potassium 4.3; Sodium 144; TSH 0.796    Lipid Panel    Component Value Date/Time   CHOL 165 03/23/2022 1141   CHOL 166 10/25/2012 0826   TRIG 114 03/23/2022 1141   TRIG 92 05/03/2016 1437   TRIG 82 10/25/2012 0826   HDL 76 03/23/2022 1141   HDL 58 05/03/2016 1437   HDL 69 10/25/2012 0826   CHOLHDL 2.2 03/23/2022 1141   LDLCALC 69 03/23/2022 1141   LDLCALC 91 03/14/2014 1139   LDLCALC 81 10/25/2012 0826      Wt Readings from Last 3 Encounters:  05/18/22 175 lb (79.4 kg)  03/23/22 174 lb (78.9 kg)  12/20/21 181 lb (82.1 kg)      Other studies Reviewed: Additional studies/ records that were reviewed today include:   Labs Review of the above records demonstrates:   See elsewhere   ASSESSMENT AND PLAN   AVR/AORTIC ROOT REPLACEMENT:    Patient has had no new symptoms.  I am going to check an echocardiogram.  She understands endocarditis prophylaxis.   RV FAILURE:   RV function in 2021 was low normal.  I will follow this up as above.   HTN:    The blood pressure at target.  No change in therapy.   RBBB: This is chronic.   She has left axis deviation as well.  She is not having any bradycardic symptoms.  We talked about presenting immediately if she were to have any syncope.  She is to call 911.  She had none of this.  No change in therapy.    Current medicines are reviewed at length with the patient today.  The patient does not have concerns regarding medicines.  The following changes have been made: None  Labs/ tests ordered today include:   None    Orders Placed This Encounter  Procedures   EKG 12-Lead   ECHOCARDIOGRAM COMPLETE      Disposition:   FU with me in 12 months.     Signed, Minus Breeding, MD  05/18/2022 12:44 PM   [facility]

## 2022-05-18 ENCOUNTER — Encounter: Payer: Self-pay | Admitting: Cardiology

## 2022-05-18 ENCOUNTER — Ambulatory Visit: Payer: Medicare Other | Admitting: Cardiology

## 2022-05-18 VITALS — BP 118/74 | HR 83 | Ht 65.0 in | Wt 175.0 lb

## 2022-05-18 DIAGNOSIS — Z952 Presence of prosthetic heart valve: Secondary | ICD-10-CM

## 2022-05-18 DIAGNOSIS — I10 Essential (primary) hypertension: Secondary | ICD-10-CM | POA: Diagnosis not present

## 2022-05-18 NOTE — Patient Instructions (Signed)
Medication Instructions:  The current medical regimen is effective;  continue present plan and medications.  *If you need a refill on your cardiac medications before your next appointment, please call your pharmacy*  Testing/Procedures: Your physician has requested that you have an echocardiogram. Echocardiography is a painless test that uses sound waves to create images of your heart. It provides your doctor with information about the size and shape of your heart and how well your heart's chambers and valves are working. This procedure takes approximately one hour. There are no restrictions for this procedure. Please do NOT wear cologne, perfume, aftershave, or lotions (deodorant is allowed). Please arrive 15 minutes prior to your appointment time.  This will be completed at [facility].  You will be contacted to be scheduled'  Follow-Up: At [facility], you and your health needs are our priority.  As part of our continuing mission to provide you with exceptional heart care, we have created designated Provider Care Teams.  These Care Teams include your primary Cardiologist (physician) and Advanced Practice Providers (APPs -  Physician Assistants and Nurse Practitioners) who all work together to provide you with the care you need, when you need it.  We recommend signing up for the patient portal called "MyChart".  Sign up information is provided on this After Visit Summary.  MyChart is used to connect with patients for Virtual Visits (Telemedicine).  Patients are able to view lab/test results, encounter notes, upcoming appointments, etc.  Non-urgent messages can be sent to your provider as well.   To learn more about what you can do with MyChart, go to [email or web address].    Your next appointment:   1 year(s)  The format for your next appointment:   In Person  Provider:   Minus Breeding, MD     Important Information About Sugar

## 2022-05-23 DIAGNOSIS — H40022 Open angle with borderline findings, high risk, left eye: Secondary | ICD-10-CM | POA: Diagnosis not present

## 2022-05-23 DIAGNOSIS — H401111 Primary open-angle glaucoma, right eye, mild stage: Secondary | ICD-10-CM | POA: Diagnosis not present

## 2022-05-24 ENCOUNTER — Telehealth: Payer: Self-pay | Admitting: Family Medicine

## 2022-05-25 ENCOUNTER — Other Ambulatory Visit: Payer: Self-pay | Admitting: Family Medicine

## 2022-05-25 DIAGNOSIS — F411 Generalized anxiety disorder: Secondary | ICD-10-CM

## 2022-05-25 DIAGNOSIS — F339 Major depressive disorder, recurrent, unspecified: Secondary | ICD-10-CM

## 2022-05-26 ENCOUNTER — Ambulatory Visit (HOSPITAL_COMMUNITY)
Admission: RE | Admit: 2022-05-26 | Discharge: 2022-05-26 | Disposition: A | Discharge status: Home / Self Care | Payer: Medicare Other | Source: Ambulatory Visit | Attending: Cardiology | Admitting: Cardiology

## 2022-05-26 DIAGNOSIS — I10 Essential (primary) hypertension: Secondary | ICD-10-CM | POA: Diagnosis not present

## 2022-05-26 DIAGNOSIS — Z952 Presence of prosthetic heart valve: Secondary | ICD-10-CM | POA: Diagnosis not present

## 2022-05-26 LAB — ECHOCARDIOGRAM COMPLETE
AR max vel: 0.86 cm2
AV Area VTI: 0.88 cm2
AV Area mean vel: 0.8 cm2
AV Mean grad: 25 mmHg
AV Peak grad: 43.6 mmHg
Ao pk vel: 3.3 m/s
Area-P 1/2: 3.17 cm2
S' Lateral: 2.7 cm

## 2022-05-26 MED ORDER — CLONAZEPAM 0.5 MG PO TABS: 0.2500 mg | ORAL_TABLET | Freq: Three times a day (TID) | ORAL | 0 refills | Status: DC | PRN

## 2022-05-26 NOTE — Telephone Encounter (Signed)
Sent 1-30-day supply of higher milligrams so they can cut in half

## 2022-05-26 NOTE — Progress Notes (Signed)
*  PRELIMINARY RESULTS* Echocardiogram 2D Echocardiogram has been performed.  [name] 05/26/2022, 12:37 PM

## 2022-05-26 NOTE — Telephone Encounter (Signed)
Attempted to call pt , no answer left a detailed message to call us back if she has any questions

## 2022-05-31 NOTE — Telephone Encounter (Signed)
Followed up with pt. She did receive higher dose and has been cutting it in half. Pt states that she would like the lower strength sent in because she feels better on that. Pt is unsure if pharmacy has. Informed pt to make sure pharmacy has in stock but that this would need to be verified with [name]. It may not be able to be sent due to the recent refill or may void CSA. Pt will call if pharm has in stock and then will get advise from Dr. Warrick Parisian.

## 2022-06-13 ENCOUNTER — Ambulatory Visit (INDEPENDENT_AMBULATORY_CARE_PROVIDER_SITE_OTHER): Payer: Medicare Other

## 2022-06-13 DIAGNOSIS — E538 Deficiency of other specified B group vitamins: Secondary | ICD-10-CM | POA: Diagnosis not present

## 2022-06-16 ENCOUNTER — Other Ambulatory Visit: Payer: Self-pay | Admitting: Family Medicine

## 2022-06-16 ENCOUNTER — Ambulatory Visit (INDEPENDENT_AMBULATORY_CARE_PROVIDER_SITE_OTHER): Payer: Medicare Other | Admitting: Family Medicine

## 2022-06-16 ENCOUNTER — Encounter: Payer: Self-pay | Admitting: Family Medicine

## 2022-06-16 VITALS — BP 128/75 | HR 91 | Temp 97.6°F | Ht 65.0 in | Wt 175.0 lb

## 2022-06-16 DIAGNOSIS — E7800 Pure hypercholesterolemia, unspecified: Secondary | ICD-10-CM | POA: Diagnosis not present

## 2022-06-16 DIAGNOSIS — F411 Generalized anxiety disorder: Secondary | ICD-10-CM | POA: Diagnosis not present

## 2022-06-16 DIAGNOSIS — I10 Essential (primary) hypertension: Secondary | ICD-10-CM | POA: Diagnosis not present

## 2022-06-16 DIAGNOSIS — J40 Bronchitis, not specified as acute or chronic: Secondary | ICD-10-CM

## 2022-06-16 DIAGNOSIS — E890 Postprocedural hypothyroidism: Secondary | ICD-10-CM | POA: Diagnosis not present

## 2022-06-16 DIAGNOSIS — F339 Major depressive disorder, recurrent, unspecified: Secondary | ICD-10-CM | POA: Diagnosis not present

## 2022-06-16 MED ORDER — CLONAZEPAM 0.25 MG PO TBDP: 0.2500 mg | ORAL_TABLET | Freq: Three times a day (TID) | ORAL | 2 refills | Status: DC | PRN

## 2022-06-16 MED ORDER — AMOXICILLIN-POT CLAVULANATE 875-125 MG PO TABS: 1.0000 | ORAL_TABLET | Freq: Two times a day (BID) | ORAL | 0 refills | Status: DC

## 2022-06-16 NOTE — Progress Notes (Signed)
BP 128/75   Pulse 91   Temp 97.6 F (36.4 C)   Ht _0  (1.651 m)   Wt 175 lb (79.4 kg)   SpO2 97%   BMI 29.12 kg/m    Subjective:   Patient ID: [name], female    DOB: 18-Jan-1941, 81 y.o.   MRN: [id number]  HPI: [name] is a 81 y.o. female presenting on 06/16/2022 for Medical Management of Chronic Issues, Hypertension, Hypothyroidism, Anxiety, and Depression   HPI Hypothyroidism recheck Patient is coming in for thyroid recheck today as well. They deny any issues with hair changes or heat or cold problems or diarrhea or constipation. They deny any chest pain or palpitations. They are currently on levothyroxine 100 micrograms   Hypertension Patient is currently on furosemide, although she cannot tell for sure if she has it still., and their blood pressure today is 128/75. Patient denies any lightheadedness or dizziness. Patient denies headaches, blurred vision, chest pains, shortness of breath, or weakness. Denies any side effects from medication and is content with current medication.   Hyperlipidemia Patient is coming in for recheck of his hyperlipidemia. The patient is currently taking simvastatin. They deny any issues with myalgias or history of liver damage from it. They deny any focal numbness or weakness or chest pain.   She has been having more congestion and coughing up phlegm and sputum that is been going on over the past couple weeks.  She feels like she has not been short of breath her oxygen has not gone down but she is worried that she is getting more congestion in her lungs.  She is having a productive cough with white sputum  Depression and anxiety recheck She is feels like she is doing okay on depression and anxiety.  Denies any suicidal ideations or thoughts of hurting herself.  She denies any suicidal ideations or thoughts of hurting self.    06/16/2022   11:13 AM 06/16/2022   11:12 AM 03/23/2022   10:59 AM 12/20/2021   11:25 AM 09/28/2021   11:30 AM   Depression screen PHQ 2/9  Decreased Interest  0 0 0 0  Down, Depressed, Hopeless  0 0 0 1  PHQ - 2 Score  0 0 0 1  Altered sleeping 0  0 0 0  Tired, decreased energy _1 Change in appetite 0  0 0 0  Feeling bad or failure about yourself  0  0 0 0  Trouble concentrating 0  0 0 1  Moving slowly or fidgety/restless 0  0 0 0  Suicidal thoughts 0  0 0 0  PHQ-9 Score   _2 Difficult doing work/chores Not difficult at all  Not difficult at all Somewhat difficult Somewhat difficult    Relevant past medical, surgical, family and social history reviewed and updated as indicated. Interim medical history since our last visit reviewed. Allergies and medications reviewed and updated.  Review of Systems  Constitutional:  Negative for chills and fever.  Eyes:  Negative for redness and visual disturbance.  Respiratory:  Negative for chest tightness and shortness of breath.   Cardiovascular:  Negative for chest pain and leg swelling.  Genitourinary:  Negative for difficulty urinating and dysuria.  Musculoskeletal:  Negative for back pain and gait problem.  Skin:  Negative for rash.  Neurological:  Negative for dizziness, light-headedness and headaches.  Psychiatric/Behavioral:  Negative for agitation and behavioral problems.   All other systems  reviewed and are negative.   Per HPI unless specifically indicated above   Allergies as of 06/16/2022       Reactions   Lipitor [atorvastatin Calcium] Other (See Comments)   myalgias   Lyrica [pregabalin] Other (See Comments)   SORES IN MOUTH    Motrin [ibuprofen] Other (See Comments)   Pt denies   Sibutramine Other (See Comments)   UNSPECIFIED REACTION  UNSPECIFIED REACTION  Reaction unknown   Sibutramine Hcl Monohydrate Other (See Comments)   UNSPECIFIED REACTION    Alendronate Sodium Nausea Only   Latex Rash, Other (See Comments)   RA to latex 1982; includes bandaids    Levofloxacin Nausea And Vomiting   Meloxicam Other (See  Comments)   Vaginal itching         Medication List        Accurate as of June 16, 2022 11:55 AM. If you have any questions, ask your nurse or doctor.          amoxicillin-clavulanate 875-125 MG tablet Commonly known as: AUGMENTIN Take 1 tablet by mouth 2 (two) times daily. Started by: [name] Ronan Dion, MD   Anucort-HC 25 MG suppository Generic drug: hydrocortisone INSERT 1 SUPPOSITORY RECTALLY TWICE DAILY   aspirin 81 MG chewable tablet Chew 81 mg by mouth at bedtime.   benzonatate 100 MG capsule Commonly known as: Tessalon Perles Take 2 capsules (200 mg total) by mouth 3 (three) times daily as needed for cough.   calcium carbonate 1250 (500 Ca) MG tablet Commonly known as: OS-CAL - dosed in mg of elemental calcium Take 1 tablet (500 mg of elemental calcium total) by mouth daily with breakfast.   clonazePAM 0.25 MG disintegrating tablet Commonly known as: KLONOPIN Take 1 tablet (0.25 mg total) by mouth 3 (three) times daily as needed for seizure. DISSOLVE 1 TABLET BY MOUTH 3 TIMES A DAY What changed: Another medication with the same name was removed. Continue taking this medication, and follow the directions you see here. Changed by: [name] Patty Leitzke, MD   docusate sodium 250 MG capsule Commonly known as: COLACE Take 250 mg by mouth daily.   furosemide 20 MG tablet Commonly known as: LASIX Take 10 mg by mouth daily. 10 mg po  Mon/Wed/Fridays   hydrocortisone 2.5 % rectal cream Commonly known as: ANUSOL-HC APPLY RECTALLY 2 TIMES A DAY AS NEEDED   ketoconazole 2 % cream Commonly known as: NIZORAL Apply topically daily.   Lactulose 20 GM/30ML Soln Take 30 mLs (20 g total) by mouth daily.   latanoprost 0.005 % ophthalmic solution Commonly known as: XALATAN Place 1 drop into both eyes at bedtime.   levothyroxine 100 MCG tablet Commonly known as: SYNTHROID Take 100 mcg by mouth daily.   linaclotide 72 MCG capsule Commonly known as:  Linzess Take 1 capsule (72 mcg total) by mouth daily before breakfast.   loratadine 10 MG tablet Commonly known as: CLARITIN TAKE 1 TABLET ONCE DAILY   magnesium oxide 400 (241.3 Mg) MG tablet Commonly known as: MAG-OX Take 1 tablet (400 mg total) by mouth daily.   multivitamin with minerals Tabs tablet Take 1 tablet by mouth daily.   potassium chloride 10 MEQ tablet Commonly known as: KLOR-CON TAKE 2 TABLETS 2 TIMES A DAY   QUEtiapine 50 MG tablet Commonly known as: SEROQUEL TAKE ONE TABLET AT BEDTIME   simvastatin 80 MG tablet Commonly known as: ZOCOR Take 1 tablet (80 mg total) by mouth daily.   triamcinolone cream 0.1 % Commonly known  as: KENALOG Apply topically 2 (two) times daily as needed.   venlafaxine 75 MG tablet Commonly known as: EFFEXOR TAKE ONE TABLET EVERY DAY   Vitamin D3 25 MCG (1000 UT) Caps 2 capsules         Objective:   BP 128/75   Pulse 91   Temp 97.6 F (36.4 C)   Ht _0  (1.651 m)   Wt 175 lb (79.4 kg)   SpO2 97%   BMI 29.12 kg/m   Wt Readings from Last 3 Encounters:  06/16/22 175 lb (79.4 kg)  05/18/22 175 lb (79.4 kg)  03/23/22 174 lb (78.9 kg)    Physical Exam Vitals and nursing note reviewed.  Constitutional:      General: She is not in acute distress.    Appearance: She is well-developed. She is not diaphoretic.  Eyes:     Conjunctiva/sclera: Conjunctivae normal.  Cardiovascular:     Rate and Rhythm: Normal rate and regular rhythm.     Heart sounds: Normal heart sounds. No murmur heard. Pulmonary:     Effort: Pulmonary effort is normal. No respiratory distress.     Breath sounds: Normal breath sounds. No wheezing.  Musculoskeletal:        General: No swelling or tenderness. Normal range of motion.  Skin:    General: Skin is warm and dry.     Findings: No rash.  Neurological:     Mental Status: She is alert and oriented to person, place, and time.     Coordination: Coordination normal.  Psychiatric:         Behavior: Behavior normal.       Assessment & Plan:   Problem List Items Addressed This Visit       Cardiovascular and Mediastinum   Essential hypertension - Primary   Relevant Orders   CBC with Differential/Platelet   CMP14+EGFR   TSH   Lipid panel     Endocrine   Hypothyroidism   Relevant Orders   TSH     Other   Depression, recurrent (HCC)   Relevant Medications   clonazePAM (KLONOPIN) 0.25 MG disintegrating tablet   Other Relevant Orders   CBC with Differential/Platelet   TSH   GAD (generalized anxiety disorder)   Relevant Medications   clonazePAM (KLONOPIN) 0.25 MG disintegrating tablet   Other Relevant Orders   CBC with Differential/Platelet   TSH   Pure hypercholesterolemia   Relevant Orders   Lipid panel   Other Visit Diagnoses     Bronchitis       Relevant Medications   amoxicillin-clavulanate (AUGMENTIN) 875-125 MG tablet     Will treat like possible bacterial infection in her lungs because of her high risk state.  Sent Augmentin for her.  Call if not getting better or worsening.  Will check blood work, continue other medicine as currently.  Follow up plan: Return in about 3 months (around 09/15/2022), or if symptoms worsen or fail to improve, for Recheck thyroid and depression and anxiety.  Counseling provided for all of the vaccine components Orders Placed This Encounter  Procedures   CBC with Differential/Platelet   CMP14+EGFR   TSH   Lipid panel    Caryl Pina, MD [facility] 06/16/2022, 11:55 AM

## 2022-06-16 NOTE — Telephone Encounter (Signed)
  To pharmacy: pt come in with appointment notes and stated she is to be taking this, but we have not filled for her since 2020, if so please send current directions, thanks!  Historical med Last OV 06/16/22 Next OV 09/14/21

## 2022-06-17 LAB — CBC WITH DIFFERENTIAL/PLATELET
Basophils Absolute: 0 10*3/uL (ref 0.0–0.2)
Basos: 1 %
EOS (ABSOLUTE): 0.2 10*3/uL (ref 0.0–0.4)
Eos: 4 %
Hematocrit: 37.8 % (ref 34.0–46.6)
Hemoglobin: 12.6 g/dL (ref 11.1–15.9)
Immature Grans (Abs): 0 10*3/uL (ref 0.0–0.1)
Immature Granulocytes: 0 %
Lymphocytes Absolute: 1.3 10*3/uL (ref 0.7–3.1)
Lymphs: 29 %
MCH: 29.8 pg (ref 26.6–33.0)
MCHC: 33.3 g/dL (ref 31.5–35.7)
MCV: 89 fL (ref 79–97)
Monocytes Absolute: 0.4 10*3/uL (ref 0.1–0.9)
Monocytes: 9 %
Neutrophils Absolute: 2.5 10*3/uL (ref 1.4–7.0)
Neutrophils: 57 %
Platelets: 190 10*3/uL (ref 150–450)
RBC: 4.23 x10E6/uL (ref 3.77–5.28)
RDW: 13.1 % (ref 11.7–15.4)
WBC: 4.4 10*3/uL (ref 3.4–10.8)

## 2022-06-17 LAB — CMP14+EGFR
ALT: 11 IU/L (ref 0–32)
AST: 26 IU/L (ref 0–40)
Albumin/Globulin Ratio: 2.3 — ABNORMAL HIGH (ref 1.2–2.2)
Albumin: 4.6 g/dL (ref 3.8–4.8)
Alkaline Phosphatase: 114 IU/L (ref 44–121)
BUN/Creatinine Ratio: 13 (ref 12–28)
BUN: 13 mg/dL (ref 8–27)
Bilirubin Total: 0.3 mg/dL (ref 0.0–1.2)
CO2: 26 mmol/L (ref 20–29)
Calcium: 8.9 mg/dL (ref 8.7–10.3)
Chloride: 105 mmol/L (ref 96–106)
Creatinine, Ser: 0.97 mg/dL (ref 0.57–1.00)
Globulin, Total: 2 g/dL (ref 1.5–4.5)
Glucose: 79 mg/dL (ref 70–99)
Potassium: 4.2 mmol/L (ref 3.5–5.2)
Sodium: 144 mmol/L (ref 134–144)
Total Protein: 6.6 g/dL (ref 6.0–8.5)
eGFR: 59 mL/min/{1.73_m2} — ABNORMAL LOW (ref >59)

## 2022-06-17 LAB — LIPID PANEL
Chol/HDL Ratio: 2.5 ratio (ref 0.0–4.4)
Cholesterol, Total: 150 mg/dL (ref 100–199)
HDL: 61 mg/dL (ref >39)
LDL Chol Calc (NIH): 68 mg/dL (ref 0–99)
Triglycerides: 121 mg/dL (ref 0–149)
VLDL Cholesterol Cal: 21 mg/dL (ref 5–40)

## 2022-06-17 LAB — TSH: TSH: 0.362 u[IU]/mL — ABNORMAL LOW (ref 0.450–4.500)

## 2022-06-17 NOTE — Progress Notes (Signed)
Patient returning call. Please call back

## 2022-06-28 ENCOUNTER — Other Ambulatory Visit: Payer: Self-pay | Admitting: Family Medicine

## 2022-07-07 ENCOUNTER — Other Ambulatory Visit: Payer: Self-pay | Admitting: Family Medicine

## 2022-07-14 ENCOUNTER — Ambulatory Visit (INDEPENDENT_AMBULATORY_CARE_PROVIDER_SITE_OTHER): Payer: Medicare Other | Admitting: *Deleted

## 2022-07-14 ENCOUNTER — Other Ambulatory Visit: Payer: Self-pay | Admitting: Family Medicine

## 2022-07-14 DIAGNOSIS — E538 Deficiency of other specified B group vitamins: Secondary | ICD-10-CM

## 2022-07-14 DIAGNOSIS — I10 Essential (primary) hypertension: Secondary | ICD-10-CM

## 2022-07-14 NOTE — Patient Instructions (Signed)

## 2022-07-14 NOTE — Progress Notes (Signed)
B12 given right deltoid. Pt tolerated well

## 2022-08-15 ENCOUNTER — Ambulatory Visit: Payer: Medicare Other

## 2022-08-17 ENCOUNTER — Ambulatory Visit (INDEPENDENT_AMBULATORY_CARE_PROVIDER_SITE_OTHER): Payer: Medicare Other

## 2022-08-17 DIAGNOSIS — E538 Deficiency of other specified B group vitamins: Secondary | ICD-10-CM

## 2022-08-17 NOTE — Progress Notes (Signed)
Cyanocobalamin injection given to left deltoid.  Patient tolerated well. 

## 2022-08-22 ENCOUNTER — Other Ambulatory Visit: Payer: Self-pay | Admitting: Family Medicine

## 2022-08-22 DIAGNOSIS — F339 Major depressive disorder, recurrent, unspecified: Secondary | ICD-10-CM

## 2022-08-22 DIAGNOSIS — F411 Generalized anxiety disorder: Secondary | ICD-10-CM

## 2022-08-25 DIAGNOSIS — K080 Exfoliation of teeth due to systemic causes: Secondary | ICD-10-CM | POA: Diagnosis not present

## 2022-08-31 ENCOUNTER — Other Ambulatory Visit: Payer: Self-pay | Admitting: Family Medicine

## 2022-09-06 DIAGNOSIS — Z930 Tracheostomy status: Secondary | ICD-10-CM | POA: Diagnosis not present

## 2022-09-06 DIAGNOSIS — J386 Stenosis of larynx: Secondary | ICD-10-CM | POA: Diagnosis not present

## 2022-09-13 ENCOUNTER — Other Ambulatory Visit: Payer: Self-pay | Admitting: Family Medicine

## 2022-09-13 DIAGNOSIS — I10 Essential (primary) hypertension: Secondary | ICD-10-CM

## 2022-09-15 ENCOUNTER — Ambulatory Visit: Payer: Medicare Other | Admitting: Family Medicine

## 2022-09-15 DIAGNOSIS — K080 Exfoliation of teeth due to systemic causes: Secondary | ICD-10-CM | POA: Diagnosis not present

## 2022-09-16 ENCOUNTER — Ambulatory Visit: Payer: Medicare Other

## 2022-09-16 ENCOUNTER — Encounter: Payer: Self-pay | Admitting: Family Medicine

## 2022-09-16 ENCOUNTER — Ambulatory Visit (INDEPENDENT_AMBULATORY_CARE_PROVIDER_SITE_OTHER): Payer: Medicare Other | Admitting: Family Medicine

## 2022-09-16 VITALS — BP 129/80 | HR 84 | Ht 65.0 in | Wt 179.0 lb

## 2022-09-16 DIAGNOSIS — E538 Deficiency of other specified B group vitamins: Secondary | ICD-10-CM | POA: Diagnosis not present

## 2022-09-16 DIAGNOSIS — E890 Postprocedural hypothyroidism: Secondary | ICD-10-CM

## 2022-09-16 DIAGNOSIS — F411 Generalized anxiety disorder: Secondary | ICD-10-CM

## 2022-09-16 DIAGNOSIS — F339 Major depressive disorder, recurrent, unspecified: Secondary | ICD-10-CM | POA: Diagnosis not present

## 2022-09-16 DIAGNOSIS — E7800 Pure hypercholesterolemia, unspecified: Secondary | ICD-10-CM

## 2022-09-16 DIAGNOSIS — I10 Essential (primary) hypertension: Secondary | ICD-10-CM | POA: Diagnosis not present

## 2022-09-16 DIAGNOSIS — B372 Candidiasis of skin and nail: Secondary | ICD-10-CM

## 2022-09-16 MED ORDER — VENLAFAXINE HCL 75 MG PO TABS: 75.0000 mg | ORAL_TABLET | Freq: Every day | ORAL | 3 refills | Status: DC

## 2022-09-16 MED ORDER — CYANOCOBALAMIN 1000 MCG/ML IJ SOLN
1000.0000 ug | Freq: Once | INTRAMUSCULAR | Status: AC
  Administered 2022-09-16: 1000 ug via INTRAMUSCULAR

## 2022-09-16 MED ORDER — NYSTATIN 100000 UNIT/GM EX POWD: 1.0000 | Freq: Three times a day (TID) | CUTANEOUS | 0 refills | Status: DC

## 2022-09-16 MED ORDER — CLONAZEPAM 0.25 MG PO TBDP: 0.2500 mg | ORAL_TABLET | Freq: Three times a day (TID) | ORAL | 2 refills | Status: DC | PRN

## 2022-09-16 MED ORDER — QUETIAPINE FUMARATE 50 MG PO TABS: 50.0000 mg | ORAL_TABLET | Freq: Every day | ORAL | 3 refills | Status: DC

## 2022-09-16 NOTE — Addendum Note (Signed)
Addended by: Alphonzo Dublin on: 09/16/2022 11:48 AM   Modules accepted: Orders

## 2022-09-16 NOTE — Progress Notes (Signed)
BP 129/80   Pulse 84   Ht 5\' 5"  (1.651 m)   Wt 179 lb (81.2 kg)   SpO2 95%   BMI 29.79 kg/m    Subjective:   Patient ID: [name], female    DOB: 1940/10/20, 82 y.o.   MRN: [id number]  HPI: [name] is a 82 y.o. female presenting on 09/16/2022 for Medical Management of Chronic Issues, Hypertension, Anxiety, and History of thyroid cancer   HPI Hypertension Patient is currently on furosemide, and their blood pressure today is 129/80. Patient denies any lightheadedness or dizziness. Patient denies headaches, blurred vision, chest pains, shortness of breath, or weakness. Denies any side effects from medication and is content with current medication.   Hypothyroidism recheck Patient is coming in for thyroid recheck today as well. They deny any issues with hair changes or heat or cold problems or diarrhea or constipation. They deny any chest pain or palpitations. They are currently on levothyroxine 100 micrograms   Anxety Current rx-Effexor and Seroquel and clonazepam 0.25 mg 3 times daily as needed # meds rx-90/month of clonazepam Effectiveness of current meds-works well at keeping it at [place].  She just has a lot of anxiety still from her trachea and sometimes difficulty breathing with it Adverse reactions form meds-none  Pill count performed-No Last drug screen -12/27/2021 ( high risk q22m, moderate risk q47m, low risk yearly ) Urine drug screen today- No Was the [place] reviewed-yes  If yes were their any concerning findings? -None  No flowsheet data found. Controlled substance contract signed on: 12/27/2021  Patient has a rash in the fold under her stomach and under her right breast that have been causing her issues over the past couple weeks.  She has been using [employer] powder and it does not seem to be fully getting it better but is not worsening too much.  She says it is very irritated and itchy and sometimes painful especially under her abdomen.  The one under her breast  is starting to get better.  She denies any fevers or chills.  Relevant past medical, surgical, family and social history reviewed and updated as indicated. Interim medical history since our last visit reviewed. Allergies and medications reviewed and updated.  Review of Systems  Constitutional:  Negative for chills and fever.  HENT:  Negative for congestion, ear discharge and ear pain.   Eyes:  Negative for redness and visual disturbance.  Respiratory:  Positive for cough. Negative for chest tightness.   Cardiovascular:  Negative for chest pain and leg swelling.  Genitourinary:  Negative for difficulty urinating and dysuria.  Musculoskeletal:  Negative for back pain and gait problem.  Skin:  Positive for rash. Negative for color change.  Neurological:  Negative for light-headedness and headaches.  Psychiatric/Behavioral:  Negative for agitation and behavioral problems.   All other systems reviewed and are negative.   Per HPI unless specifically indicated above   Allergies as of 09/16/2022       Reactions   Lipitor [atorvastatin Calcium] Other (See Comments)   myalgias   Lyrica [pregabalin] Other (See Comments)   SORES IN MOUTH    Motrin [ibuprofen] Other (See Comments)   Pt denies   Sibutramine Other (See Comments)   UNSPECIFIED REACTION  UNSPECIFIED REACTION  Reaction unknown   Sibutramine Hcl Monohydrate Other (See Comments)   UNSPECIFIED REACTION    Alendronate Sodium Nausea Only   Latex Rash, Other (See Comments)   RA to latex 1982; includes bandaids  Levofloxacin Nausea And Vomiting   Meloxicam Other (See Comments)   Vaginal itching         Medication List        Accurate as of September 16, 2022 10:10 AM. If you have any questions, ask your nurse or doctor.          STOP taking these medications    amoxicillin-clavulanate 875-125 MG tablet Commonly known as: AUGMENTIN Stopped by: [name] Noella Kipnis, MD   benzonatate 100 MG capsule Commonly known as:  Best boy Stopped by: [name] Vinicius Brockman, MD       TAKE these medications    amoxicillin 500 MG capsule Commonly known as: AMOXIL Take 500 mg by mouth 2 (two) times daily.   Anucort-HC 25 MG suppository Generic drug: hydrocortisone INSERT 1 SUPPOSITORY RECTALLY TWICE DAILY   aspirin 81 MG chewable tablet Chew 81 mg by mouth at bedtime.   calcium carbonate 1250 (500 Ca) MG tablet Commonly known as: OS-CAL - dosed in mg of elemental calcium Take 1 tablet (500 mg of elemental calcium total) by mouth daily with breakfast.   clonazePAM 0.25 MG disintegrating tablet Commonly known as: KLONOPIN Take 1 tablet (0.25 mg total) by mouth 3 (three) times daily as needed for seizure. DISSOLVE 1 TABLET BY MOUTH 3 TIMES A DAY Start taking on: October 12, 2022 What changed: These instructions start on October 12, 2022. If you are unsure what to do until then, ask your doctor or other care provider. Changed by: [name] Joaopedro Eschbach, MD   docusate sodium 250 MG capsule Commonly known as: COLACE Take 250 mg by mouth daily.   furosemide 20 MG tablet Commonly known as: LASIX Take 10 mg by mouth daily. 10 mg po  Mon/Wed/Fridays What changed: Another medication with the same name was removed. Continue taking this medication, and follow the directions you see here. Changed by: [name] Taryn Shellhammer, MD   hydrocortisone 2.5 % rectal cream Commonly known as: ANUSOL-HC APPLY RECTALLY TWICE DAILY AS NEEDED   ketoconazole 2 % cream Commonly known as: NIZORAL Apply topically daily.   lactulose 10 GM/15ML solution Commonly known as: CHRONULAC TAKE 30 MLS DAILY AS DIRECTED   latanoprost 0.005 % ophthalmic solution Commonly known as: XALATAN Place 1 drop into both eyes at bedtime.   levothyroxine 100 MCG tablet Commonly known as: SYNTHROID Take 100 mcg by mouth daily.   linaclotide 72 MCG capsule Commonly known as: Linzess Take 1 capsule (72 mcg total) by mouth daily before breakfast.    loratadine 10 MG tablet Commonly known as: CLARITIN TAKE 1 TABLET ONCE DAILY   magnesium oxide 400 (241.3 Mg) MG tablet Commonly known as: MAG-OX Take 1 tablet (400 mg total) by mouth daily.   multivitamin with minerals Tabs tablet Take 1 tablet by mouth daily.   nystatin powder Commonly known as: MYCOSTATIN/NYSTOP Apply 1 Application topically 3 (three) times daily. Started by: [name] Leavy Heatherly, MD   potassium chloride 10 MEQ tablet Commonly known as: KLOR-CON TAKE 2 TABLETS 2 TIMES A DAY   QUEtiapine 50 MG tablet Commonly known as: SEROQUEL Take 1 tablet (50 mg total) by mouth at bedtime.   simvastatin 80 MG tablet Commonly known as: ZOCOR Take 1 tablet (80 mg total) by mouth daily.   triamcinolone cream 0.1 % Commonly known as: KENALOG Apply topically 2 (two) times daily as needed.   venlafaxine 75 MG tablet Commonly known as: EFFEXOR Take 1 tablet (75 mg total) by mouth daily.   Vitamin D3  25 MCG (1000 UT) capsule Generic drug: Cholecalciferol 2 capsules         Objective:   BP 129/80   Pulse 84   Ht 5\' 5"  (1.651 m)   Wt 179 lb (81.2 kg)   SpO2 95%   BMI 29.79 kg/m   Wt Readings from Last 3 Encounters:  09/16/22 179 lb (81.2 kg)  06/16/22 175 lb (79.4 kg)  05/18/22 175 lb (79.4 kg)    Physical Exam Vitals and nursing note reviewed.  Constitutional:      General: She is not in acute distress.    Appearance: She is well-developed. She is not diaphoretic.     Comments: Trach in place  Eyes:     Conjunctiva/sclera: Conjunctivae normal.  Cardiovascular:     Rate and Rhythm: Normal rate and regular rhythm.     Heart sounds: Normal heart sounds. No murmur heard. Pulmonary:     Effort: Pulmonary effort is normal. No respiratory distress.     Breath sounds: Normal breath sounds. No wheezing.  Musculoskeletal:        General: No swelling or tenderness. Normal range of motion.  Skin:    General: Skin is warm and dry.     Findings: Rash (Beefy  pink rash on her abdomen with satellite's.  Small amount under right breast as well) present.  Neurological:     Mental Status: She is alert and oriented to person, place, and time.     Coordination: Coordination normal.  Psychiatric:        Behavior: Behavior normal.       Assessment & Plan:   Problem List Items Addressed This Visit       Cardiovascular and Mediastinum   Essential hypertension   Relevant Orders   BMP8+EGFR     Endocrine   Hypothyroidism   Relevant Orders   TSH     Other   Depression, recurrent ([place]) - Primary   Relevant Medications   clonazePAM (KLONOPIN) 0.25 MG disintegrating tablet (Start on 10/12/2022)   QUEtiapine (SEROQUEL) 50 MG tablet   venlafaxine (EFFEXOR) 75 MG tablet   GAD (generalized anxiety disorder)   Relevant Medications   clonazePAM (KLONOPIN) 0.25 MG disintegrating tablet (Start on 10/12/2022)   QUEtiapine (SEROQUEL) 50 MG tablet   venlafaxine (EFFEXOR) 75 MG tablet   Pure hypercholesterolemia   Other Visit Diagnoses     Yeast dermatitis       Relevant Medications   nystatin (MYCOSTATIN/NYSTOP) powder       Continue current medicine, gave her nystatin powder that she can use in her abdomen under her breasts as well.  No change in medication today.  Will check thyroid levels again today. Follow up plan: Return in about 3 months (around 12/17/2022), or if symptoms worsen or fail to improve, for Hypertension and hypothyroidism and anxiety recheck.  Counseling provided for all of the vaccine components Orders Placed This Encounter  Procedures   TSH   BMP8+EGFR    Caryl Pina, MD [facility] 09/16/2022, 10:10 AM

## 2022-09-17 LAB — BMP8+EGFR
BUN/Creatinine Ratio: 14 (ref 12–28)
BUN: 12 mg/dL (ref 8–27)
CO2: 24 mmol/L (ref 20–29)
Calcium: 9.6 mg/dL (ref 8.7–10.3)
Chloride: 100 mmol/L (ref 96–106)
Creatinine, Ser: 0.86 mg/dL (ref 0.57–1.00)
Glucose: 93 mg/dL (ref 70–99)
Potassium: 4.1 mmol/L (ref 3.5–5.2)
Sodium: 145 mmol/L — ABNORMAL HIGH (ref 134–144)
eGFR: 68 mL/min/{1.73_m2} (ref >59)

## 2022-09-17 LAB — TSH: TSH: 0.559 u[IU]/mL (ref 0.450–4.500)

## 2022-09-22 DIAGNOSIS — K080 Exfoliation of teeth due to systemic causes: Secondary | ICD-10-CM | POA: Diagnosis not present

## 2022-09-28 ENCOUNTER — Other Ambulatory Visit: Payer: Self-pay | Admitting: Family Medicine

## 2022-09-29 ENCOUNTER — Telehealth: Payer: Self-pay | Admitting: Family Medicine

## 2022-09-29 NOTE — Telephone Encounter (Signed)
[place] to schedule their annual wellness visit. Appointment made for 10/05/2022.  Thank you,  Colletta [place],  [facility] Direct Dial ??[phone]

## 2022-10-05 ENCOUNTER — Ambulatory Visit (INDEPENDENT_AMBULATORY_CARE_PROVIDER_SITE_OTHER): Payer: Medicare Other

## 2022-10-05 VITALS — Ht 65.0 in | Wt 163.0 lb

## 2022-10-05 DIAGNOSIS — Z0000 Encounter for general adult medical examination without abnormal findings: Secondary | ICD-10-CM

## 2022-10-05 NOTE — Patient Instructions (Signed)
[name] , Thank you for taking time to come for your Medicare Wellness Visit. I appreciate your ongoing commitment to your health goals. Please review the following plan we discussed and let me know if I can assist you in the future.   These are the goals we discussed:  Goals       Client will talk  with [name] in next 30 days about managing anxiety issues for client (pt-stated)      Current Barriers:  Client fatigues easily Client has tracheostomy and has Chronic Diagnoses of CHF,GAD, Vitamin B12 deficiency, osteoarthritis and GERD   Clinical Social Work Clinical Goal(s):  Client and [name] to communicate in next 30 days to discuss client management of anxiety issues of client  Interventions: [name] talked with Bonita Quin about nursing support with RNCM [name] talked with Bonita Quin about [name] support [name] talked with Bonita Quin about her in home care needs of client  Talked with Jhovana about ambulation needs (she uses a cane to help her  walk) Talked with client about meal provision for client Talked with client about relaxation techniques of choice (she likes to watch TV, or to sit on porch, talk on the phone with family members, likes to go out to eat)  Patient Self Care Activities:  Takes medications as prescribed Attends scheduled medical appointments  Plan:   Client to attend scheduled medical appointments [name] to call client in next 3 weeks to talk with her about anxiety symptoms management of client Client to call RNCM as needed to discuss nursing needs of client Client to use relaxation techniques of choice to manage anxiety or stress symptoms  Initial goal documentation       DIET - INCREASE WATER INTAKE      Try to drink 6-8 glasses of water daily.      Exercise 150 minutes per week (moderate activity)      Improve balance and hopefully walk better without a cane.      manage anxiety issues of client. Protect My Health (Patient)      Timeframe:  Short-Term Goal Priority:   Medium Progress:  On Track Start Date:     08/10/21                        Expected End Date:   11/01/21                   Follow Up Date    10/07/21 at 1:00 PM   Protect My Health (Patient)   Manage anxiety issues of client    Why is this important?   Screening tests can find diseases early when they are easier to treat.  Your doctor or nurse will talk with you about which tests are important for you.  Getting shots for common diseases like the flu and shingles will help prevent them.     Patient Self Care Activities:  Self administers medications as prescribed Attends all scheduled provider appointments Performs ADL's independently  Patient Coping Strengths:  Family Friends Spirituality Hopefulness  Patient Self Care Deficits:  Mobility issues Pain issues  Patient Goals:  - spend time or talk with others every day - practice relaxation or meditation daily - keep a calendar with appointment dates -talk regularly with spouse or with daughter about client needs  Follow Up Plan: [name] to call client or Lloyd Huger, daughter of client, on 10/07/21 at 1:00 PM to assess client needs  This is a list of the screening recommended for you and due dates:  Health Maintenance  Topic Date Due   COVID-19 Vaccine (6 - 2023-24 season) 02/25/2022   Flu Shot  01/26/2023   DEXA scan (bone density measurement)  09/30/2023   Medicare Annual Wellness Visit  10/05/2023   DTaP/Tdap/Td vaccine (2 - Tdap) 01/18/2024   Pneumonia Vaccine  Completed   Zoster (Shingles) Vaccine  Completed   HPV Vaccine  Aged Out    Advanced directives: Advance directive discussed with you today. I have provided a copy for you to complete at home and have notarized. Once this is complete please bring a copy in to our office so we can scan it into your chart.   Conditions/risks identified: Aim for 30 minutes of exercise or brisk walking, 6-8 glasses of water, and 5 servings of fruits and vegetables  each day.   Next appointment: Follow up in one year for your annual wellness visit    Preventive Care 65 Years and Older, Female Preventive care refers to lifestyle choices and visits with your health care provider that can promote health and wellness. What does preventive care include? A yearly physical exam. This is also called an annual well check. Dental exams once or twice a year. Routine eye exams. Ask your health care provider how often you should have your eyes checked. Personal lifestyle choices, including: Daily care of your teeth and gums. Regular physical activity. Eating a healthy diet. Avoiding tobacco and drug use. Limiting alcohol use. Practicing safe sex. Taking low-dose aspirin every day. Taking vitamin and mineral supplements as recommended by your health care provider. What happens during an annual well check? The services and screenings done by your health care provider during your annual well check will depend on your age, overall health, lifestyle risk factors, and family history of disease. Counseling  Your health care provider may ask you questions about your: Alcohol use. Tobacco use. Drug use. Emotional well-being. Home and relationship well-being. Sexual activity. Eating habits. History of falls. Memory and ability to understand (cognition). Work and work Astronomerenvironment. Reproductive health. Screening  You may have the following tests or measurements: Height, weight, and BMI. Blood pressure. Lipid and cholesterol levels. These may be checked every 5 years, or more frequently if you are over 80 years old. Skin check. Lung cancer screening. You may have this screening every year starting at age 80 if you have a 30-pack-year history of smoking and currently smoke or have quit within the past 15 years. Fecal occult blood test (FOBT) of the stool. You may have this test every year starting at age 80. Flexible sigmoidoscopy or colonoscopy. You may have a  sigmoidoscopy every 5 years or a colonoscopy every 10 years starting at age 80. Hepatitis C blood test. Hepatitis B blood test. Sexually transmitted disease (STD) testing. Diabetes screening. This is done by checking your blood sugar (glucose) after you have not eaten for a while (fasting). You may have this done every 1-3 years. Bone density scan. This is done to screen for osteoporosis. You may have this done starting at age 80. Mammogram. This may be done every 1-2 years. Talk to your health care provider about how often you should have regular mammograms. Talk with your health care provider about your test results, treatment options, and if necessary, the need for more tests. Vaccines  Your health care provider may recommend certain vaccines, such as: Influenza vaccine. This is recommended every year. Tetanus, diphtheria, and acellular  pertussis (Tdap, Td) vaccine. You may need a Td booster every 10 years. Zoster vaccine. You may need this after age 27. Pneumococcal 13-valent conjugate (PCV13) vaccine. One dose is recommended after age 50. Pneumococcal polysaccharide (PPSV23) vaccine. One dose is recommended after age 48. Talk to your health care provider about which screenings and vaccines you need and how often you need them. This information is not intended to replace advice given to you by your health care provider. Make sure you discuss any questions you have with your health care provider. Document Released: 07/10/2015 Document Revised: 03/02/2016 Document Reviewed: 04/14/2015 Elsevier Interactive Patient Education  2017 [employer].  Fall Prevention in the Home Falls can cause injuries. They can happen to people of all ages. There are many things you can do to make your home safe and to help prevent falls. What can I do on the outside of my home? Regularly fix the edges of walkways and driveways and fix any cracks. Remove anything that might make you trip as you walk through a  door, such as a raised step or threshold. Trim any bushes or trees on the path to your home. Use bright outdoor lighting. Clear any walking paths of anything that might make someone trip, such as rocks or tools. Regularly check to see if handrails are loose or broken. Make sure that both sides of any steps have handrails. Any raised decks and porches should have guardrails on the edges. Have any leaves, snow, or ice cleared regularly. Use sand or salt on walking paths during winter. Clean up any spills in your garage right away. This includes oil or grease spills. What can I do in the bathroom? Use night lights. Install grab bars by the toilet and in the tub and shower. Do not use towel bars as grab bars. Use non-skid mats or decals in the tub or shower. If you need to sit down in the shower, use a plastic, non-slip stool. Keep the floor dry. Clean up any water that spills on the floor as soon as it happens. Remove soap buildup in the tub or shower regularly. Attach bath mats securely with double-sided non-slip rug tape. Do not have throw rugs and other things on the floor that can make you trip. What can I do in the bedroom? Use night lights. Make sure that you have a light by your bed that is easy to reach. Do not use any sheets or blankets that are too big for your bed. They should not hang down onto the floor. Have a firm chair that has side arms. You can use this for support while you get dressed. Do not have throw rugs and other things on the floor that can make you trip. What can I do in the kitchen? Clean up any spills right away. Avoid walking on wet floors. Keep items that you use a lot in easy-to-reach places. If you need to reach something above you, use a strong step stool that has a grab bar. Keep electrical cords out of the way. Do not use floor polish or wax that makes floors slippery. If you must use wax, use non-skid floor wax. Do not have throw rugs and other things  on the floor that can make you trip. What can I do with my stairs? Do not leave any items on the stairs. Make sure that there are handrails on both sides of the stairs and use them. Fix handrails that are broken or loose. Make sure that handrails  are as long as the stairways. Check any carpeting to make sure that it is firmly attached to the stairs. Fix any carpet that is loose or worn. Avoid having throw rugs at the top or bottom of the stairs. If you do have throw rugs, attach them to the floor with carpet tape. Make sure that you have a light switch at the top of the stairs and the bottom of the stairs. If you do not have them, ask someone to add them for you. What else can I do to help prevent falls? Wear shoes that: Do not have high heels. Have rubber bottoms. Are comfortable and fit you well. Are closed at the toe. Do not wear sandals. If you use a stepladder: Make sure that it is fully opened. Do not climb a closed stepladder. Make sure that both sides of the stepladder are locked into place. Ask someone to hold it for you, if possible. Clearly mark and make sure that you can see: Any grab bars or handrails. First and last steps. Where the edge of each step is. Use tools that help you move around (mobility aids) if they are needed. These include: Canes. Walkers. Scooters. Crutches. Turn on the lights when you go into a dark area. Replace any light bulbs as soon as they burn out. Set up your furniture so you have a clear path. Avoid moving your furniture around. If any of your floors are uneven, fix them. If there are any pets around you, be aware of where they are. Review your medicines with your doctor. Some medicines can make you feel dizzy. This can increase your chance of falling. Ask your doctor what other things that you can do to help prevent falls. This information is not intended to replace advice given to you by your health care provider. Make sure you discuss any  questions you have with your health care provider. Document Released: 04/09/2009 Document Revised: 11/19/2015 Document Reviewed: 07/18/2014 Elsevier Interactive Patient Education  2017 [employer].

## 2022-10-05 NOTE — Progress Notes (Signed)
Subjective:   [name] is a 82 y.o. female who presents for Medicare Annual (Subsequent) preventive examination. I connected with  [name] on 10/05/22 by a audio enabled telemedicine application and verified that I am speaking with the correct person using two identifiers.  Patient Location: Home  Provider Location: Home Office  I discussed the limitations of evaluation and management by telemedicine. The patient expressed understanding and agreed to proceed.  Review of Systems     Cardiac Risk Factors include: advanced age (>68men, >22 women);dyslipidemia     Objective:    Today's Vitals   10/05/22 0930  Weight: 163 lb (73.9 kg)  Height: 5\' 5"  (1.651 m)   Body mass index is 27.12 kg/m.     10/05/2022    9:33 AM 09/28/2021   11:28 AM 10/30/2020   12:58 PM 09/24/2020    4:06 PM 04/17/2019   10:25 AM 04/12/2018    2:54 PM 01/18/2018    9:55 AM  Advanced Directives  Does Patient Have a Medical Advance Directive? Yes No No No No No No  Type of Estate agent of [place];Living will        Copy of Healthcare Power of Attorney in Chart? No - copy requested        Would patient like information on creating a medical advance directive?  No - Patient declined No - Patient declined No - Patient declined No - Patient declined No - Patient declined No - Patient declined    Current Medications (verified) Outpatient Encounter Medications as of 10/05/2022  Medication Sig   ANUCORT-HC 25 MG suppository INSERT 1 SUPPOSITORY RECTALLY TWICE DAILY   aspirin 81 MG chewable tablet Chew 81 mg by mouth at bedtime.    calcium carbonate (OS-CAL - DOSED IN MG OF ELEMENTAL CALCIUM) 1250 (500 Ca) MG tablet Take 1 tablet (500 mg of elemental calcium total) by mouth daily with breakfast.   Cholecalciferol (VITAMIN D3) 25 MCG (1000 UT) CAPS 2 capsules   [START ON 10/12/2022] clonazePAM (KLONOPIN) 0.25 MG disintegrating tablet Take 1 tablet (0.25 mg total) by mouth 3 (three)  times daily as needed for seizure. DISSOLVE 1 TABLET BY MOUTH 3 TIMES A DAY   docusate sodium (COLACE) 250 MG capsule Take 250 mg by mouth daily.   furosemide (LASIX) 20 MG tablet Take 10 mg by mouth daily. 10 mg po  Mon/Wed/Fridays   hydrocortisone (ANUSOL-HC) 2.5 % rectal cream APPLY RECTALLY TWICE DAILY AS NEEDED   ketoconazole (NIZORAL) 2 % cream Apply topically daily.   lactulose (CHRONULAC) 10 GM/15ML solution TAKE 30 MLS DAILY AS DIRECTED   latanoprost (XALATAN) 0.005 % ophthalmic solution Place 1 drop into both eyes at bedtime.   levothyroxine (SYNTHROID) 100 MCG tablet Take 100 mcg by mouth daily.   linaclotide (LINZESS) 72 MCG capsule Take 1 capsule (72 mcg total) by mouth daily before breakfast.   loratadine (CLARITIN) 10 MG tablet TAKE 1 TABLET ONCE DAILY   magnesium oxide (MAG-OX) 400 (241.3 Mg) MG tablet Take 1 tablet (400 mg total) by mouth daily.   Multiple Vitamin (MULTIVITAMIN WITH MINERALS) TABS tablet Take 1 tablet by mouth daily.   nystatin (MYCOSTATIN/NYSTOP) powder Apply 1 Application topically 3 (three) times daily.   potassium chloride (KLOR-CON) 10 MEQ tablet TAKE 2 TABLETS 2 TIMES A DAY   QUEtiapine (SEROQUEL) 50 MG tablet Take 1 tablet (50 mg total) by mouth at bedtime.   simvastatin (ZOCOR) 80 MG tablet Take 1 tablet (80 mg  total) by mouth daily.   triamcinolone (KENALOG) 0.1 % Apply topically 2 (two) times daily as needed.   venlafaxine (EFFEXOR) 75 MG tablet Take 1 tablet (75 mg total) by mouth daily.   amoxicillin (AMOXIL) 500 MG capsule Take 500 mg by mouth 2 (two) times daily. (Patient not taking: Reported on 10/05/2022)   No facility-administered encounter medications on file as of 10/05/2022.    Allergies (verified) Lipitor [atorvastatin calcium], Lyrica [pregabalin], Motrin [ibuprofen], Sibutramine, Sibutramine hcl monohydrate, Alendronate sodium, Latex, Levofloxacin, and Meloxicam   History: Past Medical History:  Diagnosis Date   ACL tear    right   Dr. Lowella Curb    Adenomatous polyp    Anxiety    Arthritis    "all over" (04/12/2018)   Ascending aortic aneurysm    note per chart per Dr Zenaida Niece Tright 4.7 cm 04/15/2015    Asthma    B12 deficiency    Chronic bronchitis    Depression    DUB (dysfunctional uterine bleeding) 10/96   Fibromyalgia    GERD (gastroesophageal reflux disease)    Glaucoma    bilaterally   Helicobacter pylori (H. pylori)    Hiatal hernia    History of blood transfusion 06/2017   "related to OHS"   History of kidney stones    History of left shoulder fracture    pt states fell off bed and broke ball in shoulder had rod placed    Hyperlipidemia    Hypertension    Hypothyroidism    Inspiratory stridor    Laryngeal stenosis    Occasional tremors    left arm    Osteoarthritis    Osteopenia    Osteoporosis    Thyroid cancer 2017   Thyroid nodule    Tracheostomy in place    Past Surgical History:  Procedure Laterality Date   AORTIC VALVE REPLACEMENT N/A 07/10/2017   Procedure: AORTIC VALVE REPLACEMENT (AVR);  Surgeon: Donata Clay, Theron Arista, MD;  Location: [facility] OR;  Service: Open Heart Surgery;  Laterality: N/A;   APPENDECTOMY     BUNIONECTOMY  08/1999   right - Dr. Ulice Brilliant    CARDIAC VALVE REPLACEMENT     CATARACT EXTRACTION, BILATERAL Bilateral 05/28/07-2009   "right-left"   CHOLECYSTECTOMY OPEN  1979   DILATION AND CURETTAGE OF UTERUS  03/31/95   Dr. Adron Bene    DIRECT LARYNGOSCOPY WITH BOTOX INJECTION N/A 12/01/2017   Procedure: SUSPENDED DIRECT MICROLARYNGOSCOPY WITH KENALOG INJECTION, CO2 LASER;  Surgeon: Christia Reading, MD;  Location[facility] OR;  Service: ENT;  Laterality: N/A;   EXCISIONAL HEMORRHOIDECTOMY     EXPLORATION POST OPERATIVE OPEN HEART N/A 07/11/2017   Procedure: EXPLORATION POST OPERATIVE OPEN HEART;  Surgeon: Kerin Perna, MD;  Location: [facility] OR;  Service: Open Heart Surgery;  Laterality: N/A;   EYE SURGERY     also had left cataract removed    FRACTURE SURGERY     HEMATOMA EVACUATION N/A  07/11/2017   Procedure: EVACUATION HEMATOMA;  Surgeon: Kerin Perna, MD;  Location: [facility] OR;  Service: Open Heart Surgery;  Laterality: N/A;   HERNIA REPAIR     JOINT REPLACEMENT     MICROLARYNGOSCOPY WITH LASER AND BALLOON DILATION N/A 01/24/2018   Procedure: MICROLARYNGOSCOPY WITH LASER AND BALLOON DILATION;  Surgeon: Christia Reading, MD;  Location: [facility] OR;  Service: ENT;  Laterality: N/A;   MICROLARYNGOSCOPY WITH LASER AND BALLOON DILATION N/A 04/13/2018   Procedure: MICROLARYNGOSCOPY WITH BALLOON DILATION;  Surgeon: Christia Reading, MD;  Location: [facility] OR;  Service: ENT;  Laterality: N/A;   NEPHROLITHOTOMY Left 04/07/2017   Procedure: NEPHROLITHOTOMY PERCUTANEOUS WITH SURGEON ACCESS;  Surgeon: Crist Fat, MD;  Location: [facility] ORS;  Service: Urology;  Laterality: Left;   ORIF HUMERUS FRACTURE  05/30/2011   Procedure: OPEN REDUCTION INTERNAL FIXATION (ORIF) PROXIMAL HUMERUS FRACTURE;  Surgeon: Verlee Rossetti;  Location: [facility] OR;  Service: Orthopedics;  Laterality: Left;  open reduction internal fixation of proximal humerus fracture   PLACEMENT OF CENTRIMAG VENTRICULAR ASSIST DEVICE Right 07/11/2017   Procedure: PLACEMENT OF CENTRIMAG VENTRICULAR ASSIST DEVICE;  Surgeon: Kerin Perna, MD;  Location: [facility] OR;  Service: Open Heart Surgery;  Laterality: Right;   REMOVAL OF CENTRIMAG VENTRICULAR ASSIST DEVICE N/A 07/17/2017   Procedure: REMOVAL OF RVAD WITH PUMP STANDBY;  Surgeon: Kerin Perna, MD;  Location: [facility] OR;  Service: Open Heart Surgery;  Laterality: N/A;   REPLACEMENT ASCENDING AORTA N/A 07/10/2017   Procedure: REPLACEMENT ASCENDING AORTA;  Surgeon: Kerin Perna, MD;  Location: [facility] OR;  Service: Open Heart Surgery;  Laterality: N/A;   RIGHT/LEFT HEART CATH AND CORONARY ANGIOGRAPHY N/A 06/13/2017   Procedure: RIGHT/LEFT HEART CATH AND CORONARY ANGIOGRAPHY;  Surgeon: Dolores Patty, MD;  Location: [facility] INVASIVE CV LAB;  Service: Cardiovascular;  Laterality: N/A;   TEE WITHOUT  CARDIOVERSION N/A 07/10/2017   Procedure: TRANSESOPHAGEAL ECHOCARDIOGRAM (TEE);  Surgeon: Donata Clay, Theron Arista, MD;  Location: [facility] OR;  Service: Open Heart Surgery;  Laterality: N/A;   TEE WITHOUT CARDIOVERSION  07/11/2017   Procedure: TRANSESOPHAGEAL ECHOCARDIOGRAM (TEE);  Surgeon: Donata Clay, Theron Arista, MD;  Location: [facility] OR;  Service: Open Heart Surgery;;   TEE WITHOUT CARDIOVERSION N/A 07/17/2017   Procedure: TRANSESOPHAGEAL ECHOCARDIOGRAM (TEE);  Surgeon: Donata Clay, Theron Arista, MD;  Location: [facility] OR;  Service: Open Heart Surgery;  Laterality: N/A;   THYROID LOBECTOMY Left 07/27/2015   Procedure: LEFT THYROID LOBECTOMY;  Surgeon: Claud Kelp, MD;  Location: [facility] ORS;  Service: General;  Laterality: Left;   THYROIDECTOMY N/A 08/06/2015   Procedure: RIGHT THYROID LOBECTOMY, REIMPLANTATION PARATHYROID;  Surgeon: Claud Kelp, MD;  Location: [facility] ORS;  Service: General;  Laterality: N/A;   TOTAL KNEE ARTHROPLASTY Right 08/2009   Dr. Lowella Curb   TRACHEOSTOMY  08/08/2017   VENTRAL HERNIA REPAIR  2/99   Family History  Problem Relation Age of Onset   Cancer Mother        PANCREATIC    Osteoporosis Mother    Hip fracture Mother    Heart attack Father 93       Died 31   Heart disease Father    Arthritis Father    Hypertension Sister    Cancer Sister        skin   Hypertension Sister    Cancer Sister        breast cancer   Breast cancer Neg Hx    Social History   Socioeconomic History   Marital status: Married    Spouse name: Jimmy   Number of children: 1   Years of education: 11   Highest education level: 11th grade  Occupational History   Occupation: homemaker  Tobacco Use   Smoking status: Never   Smokeless tobacco: Never  Vaping Use   Vaping Use: Never used  Substance and Sexual Activity   Alcohol use: No   Drug use: Never   Sexual activity: Not Currently    Birth control/protection: Post-menopausal  Other Topics Concern   Not on file  Social History Narrative   Lives with husband.  Social Determinants of Health   Financial Resource Strain: Low Risk  (10/05/2022)   Overall Financial Resource Strain (CARDIA)    Difficulty of Paying Living Expenses: Not hard at all  Food Insecurity: No Food Insecurity (10/05/2022)   Hunger Vital Sign    Worried About Running Out of Food in the Last Year: Never true    Ran Out of Food in the Last Year: Never true  Transportation Needs: No Transportation Needs (10/05/2022)   PRAPARE - Administrator, Civil ServiceTransportation    Lack of Transportation (Medical): No    Lack of Transportation (Non-Medical): No  Physical Activity: Insufficiently Active (10/05/2022)   Exercise Vital Sign    Days of Exercise per Week: 3 days    Minutes of Exercise per Session: 30 min  Stress: No Stress Concern Present (10/05/2022)   [employer] - Occupational Stress Questionnaire    Feeling of Stress : Not at all  Social Connections: Moderately Integrated (10/05/2022)   Social Connection and Isolation Panel [NHANES]    Frequency of Communication with Friends and Family: More than three times a week    Frequency of Social Gatherings with Friends and Family: More than three times a week    Attends Religious Services: More than 4 times per year    Active Member of [employer] Financial[employer]ubs or Organizations: No    Attends Engineer, structuralClub or Organization Meetings: Never    Marital Status: Married    Tobacco Counseling Counseling given: Not Answered   Clinical Intake:  Pre-visit preparation completed: Yes  Pain : No/denies pain     Nutritional Risks: None Diabetes: No  How often do you need to have someone help you when you read instructions, pamphlets, or other written materials from your doctor or pharmacy?: 1 - Never  Diabetic?no   Interpreter Needed?: No  Information entered by :: Renie OraLaura Wilson, LPN   Activities of Daily Living    10/05/2022    9:33 AM  In your present state of health, do you have any difficulty performing the following activities:  Hearing? 0   Vision? 0  Difficulty concentrating or making decisions? 0  Walking or climbing stairs? 0  Dressing or bathing? 0  Doing errands, shopping? 0  Preparing Food and eating ? N  Using the Toilet? N  In the past six months, have you accidently leaked urine? N  Do you have problems with loss of bowel control? N  Managing your Medications? N  Managing your Finances? N  Housekeeping or managing your Housekeeping? N    Patient Care Team: [name], Elige RadonJoshua A, MD as PCP - General (Family Medicine) Rollene RotundaHochrein, James, MD as Consulting Physician (Cardiology) Michaelle CopasLe, Yen Thi Hong, MD as Referring Physician (Optometry) Christia ReadingBates, Dwight, MD as Consulting Physician (Otolaryngology) Randa SpikeForrest, Kelton PillarMichael S, [name] as Social Worker (Licensed Clinical Social Worker) Talmage CoinKerr, Jeffrey, MD as Consulting Physician (Endocrinology)  Indicate any recent Medical Services you may have received from other than [facility] providers in the past year (date may be approximate).     Assessment:   This is a routine wellness examination for [name].  Hearing/Vision screen Vision Screening - Comments:: Wears rx glasses - up to date with routine eye exams with  Dr.Lee   Dietary issues and exercise activities discussed: Current Exercise Habits: Home exercise routine, Type of exercise: walking, Time (Minutes): 30, Frequency (Times/Week): 3, Weekly Exercise (Minutes/Week): 90, Intensity: Mild, Exercise limited by: None identified   Goals Addressed             This Visit's  Progress    DIET - INCREASE WATER INTAKE   On track    Try to drink 6-8 glasses of water daily.       Depression Screen    10/05/2022    9:32 AM 09/16/2022    9:50 AM 06/16/2022   11:12 AM 03/23/2022   10:59 AM 12/20/2021   11:25 AM 09/28/2021   11:30 AM 09/17/2021   11:26 AM  PHQ 2/9 Scores  PHQ - 2 Score 0 0 0 0 0 1 0  PHQ- 9 Score 0   2 2 3  0    Fall Risk    10/05/2022    9:30 AM 09/16/2022    9:50 AM 06/16/2022   11:12 AM 03/23/2022   10:59 AM 12/20/2021    11:25 AM  Fall Risk   Falls in the past year? 0 0 0 0 0  Number falls in past yr: 0      Injury with Fall? 0      Risk for fall due to : No Fall Risks      Follow up Falls prevention discussed        FALL RISK PREVENTION PERTAINING TO THE HOME:  Any stairs in or around the home? No  If so, are there any without handrails? No  Home free of loose throw rugs in walkways, pet beds, electrical cords, etc? Yes  Adequate lighting in your home to reduce risk of falls? Yes   ASSISTIVE DEVICES UTILIZED TO PREVENT FALLS:  Life alert? No  Use of a cane, walker or w/c? Yes  Grab bars in the bathroom? Yes  Shower chair or bench in shower? Yes  Elevated toilet seat or a handicapped toilet? Yes       03/12/2018    3:32 PM 12/30/2016    1:43 PM  MMSE - Mini Mental State Exam  Orientation to time 5 5  Orientation to Place 5 4  Registration 3 3  Attention/ Calculation 4 5  Recall 3 3  Language- name 2 objects 2 2  Language- repeat 1 1  Language- follow 3 step command 3 3  Language- read & follow direction 1 1  Write a sentence 0 0  Write a sentence-comments unable to write or draw due to hand trembling   Copy design 0 1  Copy design-comments unable to write due to hand tremor   Total score 27 28        10/05/2022    9:33 AM 09/28/2021   11:25 AM 04/17/2019   10:32 AM  6CIT Screen  What Year? 0 points 0 points 0 points  What month? 0 points 0 points 0 points  What time? 0 points 0 points 0 points  Count back from 20 0 points 0 points 0 points  Months in reverse 0 points 4 points 2 points  Repeat phrase 0 points 4 points 4 points  Total Score 0 points 8 points 6 points    Immunizations Immunization History  Administered Date(s) Administered   Fluad Quad(high Dose 65+) 04/01/2019, 04/10/2020, 03/18/2021, 03/23/2022   Influenza Split 03/18/2016, 04/15/2020   Influenza, High Dose Seasonal PF 04/04/2018, 04/04/2018   Influenza,inj,Quad PF,6+ Mos 03/27/2013, 04/01/2014,  03/31/2015, 03/22/2016   Moderna Sars-Covid-2 Vaccination 07/16/2019, 08/13/2019, 09/30/2019, 10/21/2019, 05/06/2020   Pneumococcal Conjugate-13 11/15/2013   Pneumococcal Polysaccharide-23 09/06/2007   Td 01/17/2014   Zoster Recombinat (Shingrix) 12/14/2020, 09/17/2021    TDAP status: Up to date  Flu Vaccine status: Up to date  Pneumococcal vaccine status:  Up to date  Covid-19 vaccine status: Completed vaccines  Qualifies for Shingles Vaccine? Yes   Zostavax completed No   Shingrix Completed?: Yes  Screening Tests Health Maintenance  Topic Date Due   COVID-19 Vaccine (6 - 2023-24 season) 02/25/2022   INFLUENZA VACCINE  01/26/2023   DEXA SCAN  09/30/2023   Medicare Annual Wellness (AWV)  10/05/2023   DTaP/Tdap/Td (2 - Tdap) 01/18/2024   Pneumonia Vaccine 68+ Years old  Completed   Zoster Vaccines- Shingrix  Completed   HPV VACCINES  Aged Out    Health Maintenance  Health Maintenance Due  Topic Date Due   COVID-19 Vaccine (6 - 2023-24 season) 02/25/2022    Colorectal cancer screening: No longer required.   Mammogram status: No longer required due to age.  Bone Density status: Completed 09/29/2021. Results reflect: Bone density results: OSTEOPOROSIS. Repeat every 2 years.  Lung Cancer Screening: (Low Dose CT Chest recommended if Age 80-80 years, 30 pack-year currently smoking OR have quit w/in 15years.) does not qualify.   Lung Cancer Screening Referral: n/a  Additional Screening:  Hepatitis C Screening: does not qualify;   Vision Screening: Recommended annual ophthalmology exams for early detection of glaucoma and other disorders of the eye. Is the patient up to date with their annual eye exam?  Yes  Who is the provider or what is the name of the office in which the patient attends annual eye exams? Dr.Lee  If pt is not established with a provider, would they like to be referred to a provider to establish care? No .   Dental Screening: Recommended annual  dental exams for proper oral hygiene  Community Resource Referral / Chronic Care Management: CRR required this visit?  No   CCM required this visit?  No      Plan:     I have personally reviewed and noted the following in the patient's chart:   Medical and social history Use of alcohol, tobacco or illicit drugs  Current medications and supplements including opioid prescriptions. Patient is not currently taking opioid prescriptions. Functional ability and status Nutritional status Physical activity Advanced directives List of other physicians Hospitalizations, surgeries, and ER visits in previous 12 months Vitals Screenings to include cognitive, depression, and falls Referrals and appointments  In addition, I have reviewed and discussed with patient certain preventive protocols, quality metrics, and best practice recommendations. A written personalized care plan for preventive services as well as general preventive health recommendations were provided to patient.     Lorrene Reid, LPN   [id number]/61/0960   Nurse Notes: none

## 2022-10-10 DIAGNOSIS — K080 Exfoliation of teeth due to systemic causes: Secondary | ICD-10-CM | POA: Diagnosis not present

## 2022-10-10 NOTE — Progress Notes (Signed)
I have reviewed and agree with the above AWV documentation Arville Care, MD [facility] 10/10/2022, 12:27 PM

## 2022-10-12 ENCOUNTER — Other Ambulatory Visit: Payer: Self-pay | Admitting: Family Medicine

## 2022-10-12 NOTE — Telephone Encounter (Signed)
Pharmacy called to clarify Lasix strength.  They have  on file.  Pts current med list has  and pt takes  MWF.  Please review for correct strength. Can send to pharmacy once reviewed.

## 2022-10-12 NOTE — Telephone Encounter (Signed)
It looks like the last cardiology note that I can see has been taking furosemide 20 mg 3 times a week, but they have managed and have controlled this for at least a while.  I would recommend reaching out to their office as they are the ones to prescribe it.

## 2022-10-14 NOTE — Telephone Encounter (Signed)
Request was for 40 mg 20 mg Historical med Please advise

## 2022-10-14 NOTE — Telephone Encounter (Signed)
I do not know if somebody else increase her medication, please contact the patient and ask if cardiology or somebody else increased it?

## 2022-10-15 ENCOUNTER — Other Ambulatory Visit: Payer: Self-pay | Admitting: Cardiology

## 2022-10-17 NOTE — Telephone Encounter (Signed)
Spoke to pharmacy they stated that you sent it over for 40 last time not 20 mg so do you want her on  or  because they have been filling 

## 2022-10-17 NOTE — Telephone Encounter (Signed)
Okay for now continue the 40 mg.

## 2022-10-18 ENCOUNTER — Ambulatory Visit (INDEPENDENT_AMBULATORY_CARE_PROVIDER_SITE_OTHER): Payer: Medicare Other

## 2022-10-18 DIAGNOSIS — E538 Deficiency of other specified B group vitamins: Secondary | ICD-10-CM | POA: Diagnosis not present

## 2022-10-18 MED ORDER — CYANOCOBALAMIN 1000 MCG/ML IJ SOLN
1000.0000 ug | INTRAMUSCULAR | Status: AC
  Administered 2022-10-18 – 2023-05-24 (×8): 1000 ug via INTRAMUSCULAR

## 2022-10-18 NOTE — Progress Notes (Signed)
Cyanocobalamin injection given to left deltoid.  Patient tolerated well. 

## 2022-10-19 MED ORDER — FUROSEMIDE 40 MG PO TABS: 40.0000 mg | ORAL_TABLET | Freq: Every day | ORAL | 0 refills | Status: DC

## 2022-10-19 NOTE — Addendum Note (Signed)
Addended by: Julious Payer D on: 10/19/2022 01:39 PM   Modules accepted: Orders

## 2022-10-19 NOTE — Telephone Encounter (Signed)
Refill 40 mg sent to pharmacy, 20 mg DC'd from current med list.

## 2022-11-11 ENCOUNTER — Other Ambulatory Visit: Payer: Self-pay | Admitting: Family Medicine

## 2022-11-11 DIAGNOSIS — I10 Essential (primary) hypertension: Secondary | ICD-10-CM

## 2022-11-14 DIAGNOSIS — H40022 Open angle with borderline findings, high risk, left eye: Secondary | ICD-10-CM | POA: Diagnosis not present

## 2022-11-14 DIAGNOSIS — H401111 Primary open-angle glaucoma, right eye, mild stage: Secondary | ICD-10-CM | POA: Diagnosis not present

## 2022-11-16 ENCOUNTER — Other Ambulatory Visit: Payer: Self-pay | Admitting: Family Medicine

## 2022-11-18 ENCOUNTER — Ambulatory Visit (INDEPENDENT_AMBULATORY_CARE_PROVIDER_SITE_OTHER): Payer: Medicare Other

## 2022-11-18 DIAGNOSIS — E538 Deficiency of other specified B group vitamins: Secondary | ICD-10-CM | POA: Diagnosis not present

## 2022-11-18 NOTE — Progress Notes (Signed)
Cyanocobalamin injection given to right deltoid.  Patient tolerated well. 

## 2022-12-07 DIAGNOSIS — J386 Stenosis of larynx: Secondary | ICD-10-CM | POA: Diagnosis not present

## 2022-12-07 DIAGNOSIS — Z930 Tracheostomy status: Secondary | ICD-10-CM | POA: Diagnosis not present

## 2022-12-13 DIAGNOSIS — F3342 Major depressive disorder, recurrent, in full remission: Secondary | ICD-10-CM | POA: Diagnosis not present

## 2022-12-13 DIAGNOSIS — M810 Age-related osteoporosis without current pathological fracture: Secondary | ICD-10-CM | POA: Diagnosis not present

## 2022-12-15 ENCOUNTER — Other Ambulatory Visit: Payer: Self-pay | Admitting: Family Medicine

## 2022-12-20 ENCOUNTER — Ambulatory Visit (INDEPENDENT_AMBULATORY_CARE_PROVIDER_SITE_OTHER): Payer: Medicare Other

## 2022-12-20 ENCOUNTER — Telehealth: Payer: Self-pay | Admitting: Family Medicine

## 2022-12-20 DIAGNOSIS — I10 Essential (primary) hypertension: Secondary | ICD-10-CM

## 2022-12-20 DIAGNOSIS — I480 Paroxysmal atrial fibrillation: Secondary | ICD-10-CM

## 2022-12-20 DIAGNOSIS — E538 Deficiency of other specified B group vitamins: Secondary | ICD-10-CM | POA: Diagnosis not present

## 2022-12-20 DIAGNOSIS — E890 Postprocedural hypothyroidism: Secondary | ICD-10-CM

## 2022-12-20 NOTE — Progress Notes (Signed)
Cyanocobalamin injection given to left deltoid.  Patient tolerated well. 

## 2022-12-21 ENCOUNTER — Other Ambulatory Visit: Payer: Medicare Other

## 2022-12-21 DIAGNOSIS — E890 Postprocedural hypothyroidism: Secondary | ICD-10-CM | POA: Diagnosis not present

## 2022-12-21 DIAGNOSIS — I10 Essential (primary) hypertension: Secondary | ICD-10-CM | POA: Diagnosis not present

## 2022-12-21 DIAGNOSIS — I480 Paroxysmal atrial fibrillation: Secondary | ICD-10-CM | POA: Diagnosis not present

## 2022-12-21 NOTE — Telephone Encounter (Signed)
Placed lab orders for the patient, I will she was notified that she has to make a lab appointment today and I hope there are still appointments left.

## 2022-12-21 NOTE — Telephone Encounter (Signed)
Left message making pt aware. Scheduled with lab today at 11am. Advised pt to call back if that time will not work.

## 2022-12-22 LAB — CBC WITH DIFFERENTIAL/PLATELET
Basophils Absolute: 0 10*3/uL (ref 0.0–0.2)
Basos: 0 %
EOS (ABSOLUTE): 0.2 10*3/uL (ref 0.0–0.4)
Eos: 3 %
Hematocrit: 38.2 % (ref 34.0–46.6)
Hemoglobin: 13 g/dL (ref 11.1–15.9)
Immature Grans (Abs): 0 10*3/uL (ref 0.0–0.1)
Immature Granulocytes: 0 %
Lymphocytes Absolute: 1.5 10*3/uL (ref 0.7–3.1)
Lymphs: 33 %
MCH: 29.3 pg (ref 26.6–33.0)
MCHC: 34 g/dL (ref 31.5–35.7)
MCV: 86 fL (ref 79–97)
Monocytes Absolute: 0.4 10*3/uL (ref 0.1–0.9)
Monocytes: 8 %
Neutrophils Absolute: 2.5 10*3/uL (ref 1.4–7.0)
Neutrophils: 56 %
Platelets: 210 10*3/uL (ref 150–450)
RBC: 4.43 x10E6/uL (ref 3.77–5.28)
RDW: 12.8 % (ref 11.7–15.4)
WBC: 4.6 10*3/uL (ref 3.4–10.8)

## 2022-12-22 LAB — CMP14+EGFR
ALT: 13 IU/L (ref 0–32)
AST: 25 IU/L (ref 0–40)
Albumin: 4.5 g/dL (ref 3.8–4.8)
Alkaline Phosphatase: 120 IU/L (ref 44–121)
BUN/Creatinine Ratio: 17 (ref 12–28)
BUN: 14 mg/dL (ref 8–27)
Bilirubin Total: 0.5 mg/dL (ref 0.0–1.2)
CO2: 27 mmol/L (ref 20–29)
Calcium: 9.3 mg/dL (ref 8.7–10.3)
Chloride: 101 mmol/L (ref 96–106)
Creatinine, Ser: 0.83 mg/dL (ref 0.57–1.00)
Globulin, Total: 2.2 g/dL (ref 1.5–4.5)
Glucose: 89 mg/dL (ref 70–99)
Potassium: 4 mmol/L (ref 3.5–5.2)
Sodium: 142 mmol/L (ref 134–144)
Total Protein: 6.7 g/dL (ref 6.0–8.5)
eGFR: 71 mL/min/{1.73_m2} (ref >59)

## 2022-12-22 LAB — LIPID PANEL
Chol/HDL Ratio: 2.3 ratio (ref 0.0–4.4)
Cholesterol, Total: 166 mg/dL (ref 100–199)
HDL: 71 mg/dL (ref >39)
LDL Chol Calc (NIH): 77 mg/dL (ref 0–99)
Triglycerides: 99 mg/dL (ref 0–149)
VLDL Cholesterol Cal: 18 mg/dL (ref 5–40)

## 2022-12-22 LAB — TSH: TSH: 0.479 u[IU]/mL (ref 0.450–4.500)

## 2022-12-23 ENCOUNTER — Ambulatory Visit: Payer: Medicare Other | Admitting: Family Medicine

## 2023-01-10 ENCOUNTER — Other Ambulatory Visit: Payer: Self-pay | Admitting: Family Medicine

## 2023-01-10 DIAGNOSIS — I10 Essential (primary) hypertension: Secondary | ICD-10-CM

## 2023-01-16 ENCOUNTER — Ambulatory Visit: Payer: Medicare Other | Admitting: Family Medicine

## 2023-01-17 ENCOUNTER — Other Ambulatory Visit: Payer: Self-pay | Admitting: Family Medicine

## 2023-01-19 ENCOUNTER — Ambulatory Visit (INDEPENDENT_AMBULATORY_CARE_PROVIDER_SITE_OTHER): Payer: Medicare Other

## 2023-01-19 DIAGNOSIS — E538 Deficiency of other specified B group vitamins: Secondary | ICD-10-CM

## 2023-01-19 DIAGNOSIS — R5381 Other malaise: Secondary | ICD-10-CM | POA: Diagnosis not present

## 2023-01-19 DIAGNOSIS — Z930 Tracheostomy status: Secondary | ICD-10-CM | POA: Diagnosis not present

## 2023-01-19 NOTE — Progress Notes (Signed)
Cyanocobalamin injection given to right deltoid.  Patient tolerated well. 

## 2023-01-25 ENCOUNTER — Encounter: Payer: Self-pay | Admitting: Family Medicine

## 2023-01-25 ENCOUNTER — Ambulatory Visit (INDEPENDENT_AMBULATORY_CARE_PROVIDER_SITE_OTHER): Payer: Medicare Other | Admitting: Family Medicine

## 2023-01-25 VITALS — BP 124/71 | HR 87 | Ht 65.0 in | Wt 178.0 lb

## 2023-01-25 DIAGNOSIS — E890 Postprocedural hypothyroidism: Secondary | ICD-10-CM | POA: Diagnosis not present

## 2023-01-25 DIAGNOSIS — F339 Major depressive disorder, recurrent, unspecified: Secondary | ICD-10-CM | POA: Diagnosis not present

## 2023-01-25 DIAGNOSIS — E7800 Pure hypercholesterolemia, unspecified: Secondary | ICD-10-CM

## 2023-01-25 DIAGNOSIS — F411 Generalized anxiety disorder: Secondary | ICD-10-CM

## 2023-01-25 DIAGNOSIS — I10 Essential (primary) hypertension: Secondary | ICD-10-CM

## 2023-01-25 MED ORDER — CLONAZEPAM 0.25 MG PO TBDP: 0.2500 mg | ORAL_TABLET | Freq: Three times a day (TID) | ORAL | 2 refills | Status: DC | PRN

## 2023-01-25 MED ORDER — SIMVASTATIN 80 MG PO TABS: 80.0000 mg | ORAL_TABLET | Freq: Every day | ORAL | 3 refills | Status: DC

## 2023-01-25 NOTE — Progress Notes (Signed)
BP 124/71   Pulse 87   Ht 5\' 5"  (1.651 m)   Wt 178 lb (80.7 kg)   SpO2 96%   BMI 29.62 kg/m    Subjective:   Patient ID: [name], female    DOB: 05-16-1941, 82 y.o.   MRN: [id number]  HPI: [name] is a 82 y.o. female presenting on 01/25/2023 for Medical Management of Chronic Issues and Hypertension   HPI Hypertension Patient is currently on furosemide, and their blood pressure today is 124/71. Patient denies any lightheadedness or dizziness. Patient denies headaches, blurred vision, chest pains, shortness of breath, or weakness. Denies any side effects from medication and is content with current medication.   Hyperlipidemia Patient is coming in for recheck of his hyperlipidemia. The patient is currently taking simvastatin. They deny any issues with myalgias or history of liver damage from it. They deny any focal numbness or weakness or chest pain.   Hypothyroidism recheck with history of thyroid cancer Patient is coming in for thyroid recheck today as well. They deny any issues with hair changes or heat or cold problems or diarrhea or constipation. They deny any chest pain or palpitations. They are currently on levothyroxine 100 micrograms   Anxiety Patient has a lot of anxiety secondary to her health issues including her vocal cord paralysis.  Patient also takes Effexor 75 mg to help with this. Current rx-clonazepam 1.25 mg 3 times daily as needed # meds rx-90/month Effectiveness of current meds-works well Adverse reactions form meds-none  Pill count performed-No Last drug screen -12/27/2021 ( high risk q59m, moderate risk q29m, low risk yearly ) Urine drug screen today- Yes Was the [place] reviewed-yes  If yes were their any concerning findings? -None  No flowsheet data found.   Controlled substance contract signed on: Today  Relevant past medical, surgical, family and social history reviewed and updated as indicated. Interim medical history since our last visit  reviewed. Allergies and medications reviewed and updated.  Review of Systems  Constitutional:  Negative for chills and fever.  HENT:  Positive for congestion. Negative for ear discharge and ear pain.   Eyes:  Negative for redness and visual disturbance.  Respiratory:  Positive for cough. Negative for chest tightness and shortness of breath.   Cardiovascular:  Negative for chest pain and leg swelling.  Genitourinary:  Negative for difficulty urinating and dysuria.  Musculoskeletal:  Negative for back pain and gait problem.  Skin:  Negative for rash.  Neurological:  Negative for light-headedness and headaches.  Psychiatric/Behavioral:  Negative for agitation and behavioral problems.   All other systems reviewed and are negative.   Per HPI unless specifically indicated above   Allergies as of 01/25/2023       Reactions   Lipitor [atorvastatin Calcium] Other (See Comments)   myalgias   Lyrica [pregabalin] Other (See Comments)   SORES IN MOUTH    Motrin [ibuprofen] Other (See Comments)   Pt denies   Sibutramine Other (See Comments)   UNSPECIFIED REACTION  UNSPECIFIED REACTION  Reaction unknown   Sibutramine Hcl Monohydrate Other (See Comments)   UNSPECIFIED REACTION    Alendronate Sodium Nausea Only   Latex Rash, Other (See Comments)   RA to latex 1982; includes bandaids    Levofloxacin Nausea And Vomiting   Meloxicam Other (See Comments)   Vaginal itching         Medication List        Accurate as of January 25, 2023 10:52 AM. If  you have any questions, ask your nurse or doctor.          STOP taking these medications    amoxicillin 500 MG capsule Commonly known as: AMOXIL Stopped by: Elige Radon Kobee Medlen       TAKE these medications    Anucort-HC 25 MG suppository Generic drug: hydrocortisone INSERT 1 SUPPOSITORY RECTALLY TWICE DAILY   aspirin 81 MG chewable tablet Chew 81 mg by mouth at bedtime.   calcium carbonate 1250 (500 Ca) MG tablet Commonly  known as: OS-CAL - dosed in mg of elemental calcium Take 1 tablet (500 mg of elemental calcium total) by mouth daily with breakfast.   clonazePAM 0.25 MG disintegrating tablet Commonly known as: KLONOPIN Take 1 tablet (0.25 mg total) by mouth 3 (three) times daily as needed for seizure. Please cancel out any other prescriptions on file What changed: additional instructions Changed by: Elige Radon Skyann Ganim   docusate sodium 250 MG capsule Commonly known as: COLACE Take 250 mg by mouth daily.   furosemide 40 MG tablet Commonly known as: LASIX TAKE ONE TABLET BY MOUTH DAILY   hydrocortisone 2.5 % rectal cream Commonly known as: ANUSOL-HC APPLY RECTALLY TWICE DAILY AS NEEDED   ketoconazole 2 % cream Commonly known as: NIZORAL Apply topically daily.   lactulose 10 GM/15ML solution Commonly known as: CHRONULAC TAKE 30 MLS DAILY AS DIRECTED   latanoprost 0.005 % ophthalmic solution Commonly known as: XALATAN Place 1 drop into both eyes at bedtime.   levothyroxine 100 MCG tablet Commonly known as: SYNTHROID Take 100 mcg by mouth daily.   linaclotide 72 MCG capsule Commonly known as: Linzess Take 1 capsule (72 mcg total) by mouth daily before breakfast.   loratadine 10 MG tablet Commonly known as: CLARITIN TAKE 1 TABLET ONCE DAILY   magnesium oxide 400 (241.3 Mg) MG tablet Commonly known as: MAG-OX Take 1 tablet (400 mg total) by mouth daily.   multivitamin with minerals Tabs tablet Take 1 tablet by mouth daily.   nystatin powder Commonly known as: MYCOSTATIN/NYSTOP Apply 1 Application topically 3 (three) times daily.   potassium chloride 10 MEQ tablet Commonly known as: KLOR-CON TAKE TWO TABLETS TWICE DAILY   QUEtiapine 50 MG tablet Commonly known as: SEROQUEL Take 1 tablet (50 mg total) by mouth at bedtime.   simvastatin 80 MG tablet Commonly known as: ZOCOR Take 1 tablet (80 mg total) by mouth daily.   triamcinolone cream 0.1 % Commonly known as:  KENALOG Apply topically 2 (two) times daily as needed.   venlafaxine 75 MG tablet Commonly known as: EFFEXOR Take 1 tablet (75 mg total) by mouth daily.   Vitamin D3 25 MCG (1000 UT) Caps 2 capsules         Objective:   BP 124/71   Pulse 87   Ht 5\' 5"  (1.651 m)   Wt 178 lb (80.7 kg)   SpO2 96%   BMI 29.62 kg/m   Wt Readings from Last 3 Encounters:  01/25/23 178 lb (80.7 kg)  10/05/22 163 lb (73.9 kg)  09/16/22 179 lb (81.2 kg)    Physical Exam Vitals and nursing note reviewed.  Constitutional:      General: She is not in acute distress.    Appearance: She is well-developed. She is not diaphoretic.  Eyes:     Conjunctiva/sclera: Conjunctivae normal.  Cardiovascular:     Rate and Rhythm: Normal rate and regular rhythm.     Heart sounds: Normal heart sounds. No murmur heard. Pulmonary:  Effort: Pulmonary effort is normal. No respiratory distress.     Breath sounds: Normal breath sounds. No wheezing, rhonchi or rales.  Musculoskeletal:        General: No tenderness. Normal range of motion.  Skin:    General: Skin is warm and dry.     Findings: No rash.  Neurological:     Mental Status: She is alert and oriented to person, place, and time.     Coordination: Coordination normal.  Psychiatric:        Behavior: Behavior normal.     Results for orders placed or performed in visit on 12/21/22  TSH  Result Value Ref Range   TSH 0.479 0.450 - 4.500 uIU/mL  CBC with Differential/Platelet  Result Value Ref Range   WBC 4.6 3.4 - 10.8 x10E3/uL   RBC 4.43 3.77 - 5.28 x10E6/uL   Hemoglobin 13.0 11.1 - 15.9 g/dL   Hematocrit 69.6 29.5 - 46.6 %   MCV 86 79 - 97 fL   MCH 29.3 26.6 - 33.0 pg   MCHC 34.0 31.5 - 35.7 g/dL   RDW 28.4 13.2 - 44.0 %   Platelets 210 150 - 450 x10E3/uL   Neutrophils 56 Not Estab. %   Lymphs 33 Not Estab. %   Monocytes 8 Not Estab. %   Eos 3 Not Estab. %   Basos 0 Not Estab. %   Neutrophils Absolute 2.5 1.4 - 7.0 x10E3/uL    Lymphocytes Absolute 1.5 0.7 - 3.1 x10E3/uL   Monocytes Absolute 0.4 0.1 - 0.9 x10E3/uL   EOS (ABSOLUTE) 0.2 0.0 - 0.4 x10E3/uL   Basophils Absolute 0.0 0.0 - 0.2 x10E3/uL   Immature Granulocytes 0 Not Estab. %   Immature Grans (Abs) 0.0 0.0 - 0.1 x10E3/uL  CMP14+EGFR  Result Value Ref Range   Glucose 89 70 - 99 mg/dL   BUN 14 8 - 27 mg/dL   Creatinine, Ser 1.02 0.57 - 1.00 mg/dL   eGFR 71 >72 ZD/GUY/4.03   BUN/Creatinine Ratio 17 12 - 28   Sodium 142 134 - 144 mmol/L   Potassium 4.0 3.5 - 5.2 mmol/L   Chloride 101 96 - 106 mmol/L   CO2 27 20 - 29 mmol/L   Calcium 9.3 8.7 - 10.3 mg/dL   Total Protein 6.7 6.0 - 8.5 g/dL   Albumin 4.5 3.8 - 4.8 g/dL   Globulin, Total 2.2 1.5 - 4.5 g/dL   Bilirubin Total 0.5 0.0 - 1.2 mg/dL   Alkaline Phosphatase 120 44 - 121 IU/L   AST 25 0 - 40 IU/L   ALT 13 0 - 32 IU/L  Lipid panel  Result Value Ref Range   Cholesterol, Total 166 100 - 199 mg/dL   Triglycerides 99 0 - 149 mg/dL   HDL 71 >47 mg/dL   VLDL Cholesterol Cal 18 5 - 40 mg/dL   LDL Chol Calc (NIH) 77 0 - 99 mg/dL   Chol/HDL Ratio 2.3 0.0 - 4.4 ratio   *Note: Due to a large number of results and/or encounters for the requested time period, some results have not been displayed. A complete set of results can be found in Results Review.    Assessment & Plan:   Problem List Items Addressed This Visit       Cardiovascular and Mediastinum   Essential hypertension - Primary   Relevant Medications   simvastatin (ZOCOR) 80 MG tablet     Endocrine   Hypothyroidism     Other   Depression, recurrent (  HCC)   Relevant Medications   clonazePAM (KLONOPIN) 0.25 MG disintegrating tablet   Other Relevant Orders   ToxASSURE Select 13 (MW), Urine   GAD (generalized anxiety disorder)   Relevant Medications   clonazePAM (KLONOPIN) 0.25 MG disintegrating tablet   Other Relevant Orders   ToxASSURE Select 13 (MW), Urine   Pure hypercholesterolemia   Relevant Medications   simvastatin  (ZOCOR) 80 MG tablet    Patient has some cough and chest congestion that she gets from drainage from her chronic trach, recommended that she could try adding Mucinex and Benadryl in the evening, she already takes Claritin. Follow up plan: Return in about 3 months (around 04/27/2023), or if symptoms worsen or fail to improve, for Anxiety and thyroid recheck.  Counseling provided for all of the vaccine components Orders Placed This Encounter  Procedures   ToxASSURE Select 13 (MW), Urine    Arville Care, MD [facility] 01/25/2023, 10:52 AM

## 2023-02-10 ENCOUNTER — Other Ambulatory Visit: Payer: Self-pay | Admitting: Family Medicine

## 2023-02-10 DIAGNOSIS — I10 Essential (primary) hypertension: Secondary | ICD-10-CM

## 2023-02-13 ENCOUNTER — Other Ambulatory Visit: Payer: Self-pay | Admitting: Family Medicine

## 2023-02-20 ENCOUNTER — Ambulatory Visit (INDEPENDENT_AMBULATORY_CARE_PROVIDER_SITE_OTHER): Payer: Medicare Other

## 2023-02-20 DIAGNOSIS — E538 Deficiency of other specified B group vitamins: Secondary | ICD-10-CM

## 2023-02-20 NOTE — Progress Notes (Signed)
Cyanocobalamin injection given to left deltoid.  Patient tolerated well. 

## 2023-03-01 ENCOUNTER — Other Ambulatory Visit: Payer: Self-pay | Admitting: Family Medicine

## 2023-03-14 DIAGNOSIS — B369 Superficial mycosis, unspecified: Secondary | ICD-10-CM | POA: Diagnosis not present

## 2023-03-14 DIAGNOSIS — Z930 Tracheostomy status: Secondary | ICD-10-CM | POA: Diagnosis not present

## 2023-03-23 ENCOUNTER — Ambulatory Visit (INDEPENDENT_AMBULATORY_CARE_PROVIDER_SITE_OTHER): Payer: Medicare Other | Admitting: *Deleted

## 2023-03-23 DIAGNOSIS — E538 Deficiency of other specified B group vitamins: Secondary | ICD-10-CM

## 2023-03-23 DIAGNOSIS — Z23 Encounter for immunization: Secondary | ICD-10-CM | POA: Diagnosis not present

## 2023-03-23 NOTE — Progress Notes (Signed)
PATIENT IN FOR HER MONTHLY B 12 INJECTION. TOLERATED WELL.

## 2023-04-13 ENCOUNTER — Other Ambulatory Visit: Payer: Self-pay | Admitting: Family Medicine

## 2023-04-13 DIAGNOSIS — I10 Essential (primary) hypertension: Secondary | ICD-10-CM

## 2023-04-21 ENCOUNTER — Encounter: Payer: Self-pay | Admitting: Family Medicine

## 2023-04-21 ENCOUNTER — Ambulatory Visit (INDEPENDENT_AMBULATORY_CARE_PROVIDER_SITE_OTHER): Payer: Medicare Other | Admitting: Family Medicine

## 2023-04-21 VITALS — BP 133/81 | HR 81 | Ht 65.0 in | Wt 170.0 lb

## 2023-04-21 DIAGNOSIS — F411 Generalized anxiety disorder: Secondary | ICD-10-CM

## 2023-04-21 DIAGNOSIS — I10 Essential (primary) hypertension: Secondary | ICD-10-CM

## 2023-04-21 DIAGNOSIS — E890 Postprocedural hypothyroidism: Secondary | ICD-10-CM

## 2023-04-21 DIAGNOSIS — F339 Major depressive disorder, recurrent, unspecified: Secondary | ICD-10-CM

## 2023-04-21 DIAGNOSIS — E7800 Pure hypercholesterolemia, unspecified: Secondary | ICD-10-CM | POA: Diagnosis not present

## 2023-04-21 DIAGNOSIS — B372 Candidiasis of skin and nail: Secondary | ICD-10-CM

## 2023-04-21 MED ORDER — QUETIAPINE FUMARATE 100 MG PO TABS: 100.0000 mg | ORAL_TABLET | Freq: Every day | ORAL | 3 refills | Status: DC

## 2023-04-21 MED ORDER — CLONAZEPAM 0.25 MG PO TBDP: 0.2500 mg | ORAL_TABLET | Freq: Three times a day (TID) | ORAL | 2 refills | Status: DC | PRN

## 2023-04-21 MED ORDER — FLUCONAZOLE 150 MG PO TABS: 150.0000 mg | ORAL_TABLET | ORAL | 0 refills | Status: AC

## 2023-04-21 NOTE — Progress Notes (Unsigned)
BP 133/81   Pulse 81   Ht 5\' 5"  (1.651 m)   Wt 170 lb (77.1 kg)   SpO2 98%   BMI 28.29 kg/m    Subjective:   Patient ID: [name], female    DOB: Oct 29, 1940, 82 y.o.   MRN: [id number]  HPI: [name] is a 82 y.o. female presenting on 04/21/2023 for Medical Management of Chronic Issues and Rash (On abdomen and under breast has used nystatin powder and gold bond and it helps a little but the itching is really bad)  Hypertension Patient is currently taking furosemide. Her blood pressure today is 133/81. She denies lightheadedness, headache, headache, vision changes, chest pain, or shortness of breath.  Hyperlipidemia Patient is currently taking simvastatin. She denies myalgias or weakness. She does not have a history of liver damage from it.  Hypothyroidism/Hx of Thyroid Cancer Patient is currently taking levothyroxine 100 mcg. She reports some heat intolerance but otherwise denies cold intolerance, hair or skin changes, diarrhea or constipation, or palpitations.  Anxiety Patient is currently taking venlafaxine, clonazepam, and quetiapine. She reports some anxiety throughout the day, but she mainly struggles with going to sleep at night. It usually takes her several hours to fall asleep once lying in bed as her mind remains occupied with tasks to complete.  Patient also reports intertriginous yeast (underneath breast, abdominal fold, and groin) for which she has been using nystatin powder. She feels like the nystatin powder helps some, but then the yeast come back rather quickly. She is bothered by the the pruritus and constant scratching.  Relevant past medical, surgical, family and social history reviewed and updated as indicated. Interim medical history since our last visit reviewed. Allergies and medications reviewed and updated.  Review of Systems  Constitutional:  Negative for fatigue.  HENT:  Negative for congestion, sore throat and trouble swallowing.   Eyes:   Negative for visual disturbance.  Respiratory:  Positive for shortness of breath. Negative for chest tightness.        SOB when tracheostomy becomes clogged with secretions  Cardiovascular:  Negative for chest pain and palpitations.  Gastrointestinal:  Negative for constipation and diarrhea.  Endocrine: Positive for heat intolerance. Negative for cold intolerance.  Musculoskeletal:  Negative for myalgias.  Skin:  Positive for rash.  Neurological:  Negative for syncope, weakness, light-headedness and headaches.  Psychiatric/Behavioral:  The patient is nervous/anxious.     Per HPI unless specifically indicated above   Allergies as of 04/21/2023       Reactions   Lipitor [atorvastatin Calcium] Other (See Comments)   myalgias   Lyrica [pregabalin] Other (See Comments)   SORES IN MOUTH    Motrin [ibuprofen] Other (See Comments)   Pt denies   Sibutramine Other (See Comments)   UNSPECIFIED REACTION  UNSPECIFIED REACTION  Reaction unknown   Sibutramine Hcl Monohydrate Other (See Comments)   UNSPECIFIED REACTION    Alendronate Sodium Nausea Only   Latex Rash, Other (See Comments)   RA to latex 1982; includes bandaids    Levofloxacin Nausea And Vomiting   Meloxicam Other (See Comments)   Vaginal itching         Medication List        Accurate as of April 21, 2023  5:17 PM. If you have any questions, ask your nurse or doctor.          Anucort-HC 25 MG suppository Generic drug: hydrocortisone INSERT 1 SUPPOSITORY RECTALLY TWICE DAILY   aspirin 81 MG  chewable tablet Chew 81 mg by mouth at bedtime.   calcium carbonate 1250 (500 Ca) MG tablet Commonly known as: OS-CAL - dosed in mg of elemental calcium Take 1 tablet (500 mg of elemental calcium total) by mouth daily with breakfast.   clonazePAM 0.25 MG disintegrating tablet Commonly known as: KLONOPIN Take 1 tablet (0.25 mg total) by mouth 3 (three) times daily as needed for seizure. Please cancel out any other  prescriptions on file   docusate sodium 250 MG capsule Commonly known as: COLACE Take 250 mg by mouth daily.   fluconazole 150 MG tablet Commonly known as: Diflucan Take 1 tablet (150 mg total) by mouth once a week. Started by: Elige Radon [name]   furosemide 40 MG tablet Commonly known as: LASIX TAKE ONE TABLET DAILY   hydrocortisone 2.5 % rectal cream Commonly known as: ANUSOL-HC APPLY RECTALLY TWICE DAILY AS NEEDED   ketoconazole 2 % cream Commonly known as: NIZORAL Apply topically daily.   lactulose 10 GM/15ML solution Commonly known as: CHRONULAC TAKE 30 MLS DAILY AS DIRECTED   latanoprost 0.005 % ophthalmic solution Commonly known as: XALATAN Place 1 drop into both eyes at bedtime.   levothyroxine 100 MCG tablet Commonly known as: SYNTHROID Take 100 mcg by mouth daily.   linaclotide 72 MCG capsule Commonly known as: Linzess Take 1 capsule (72 mcg total) by mouth daily before breakfast.   loratadine 10 MG tablet Commonly known as: CLARITIN TAKE 1 TABLET ONCE DAILY   magnesium oxide 400 (241.3 Mg) MG tablet Commonly known as: MAG-OX Take 1 tablet (400 mg total) by mouth daily.   multivitamin with minerals Tabs tablet Take 1 tablet by mouth daily.   nystatin powder Commonly known as: MYCOSTATIN/NYSTOP Apply 1 Application topically 3 (three) times daily.   potassium chloride 10 MEQ tablet Commonly known as: KLOR-CON TAKE TWO TABLETS TWICE DAILY   QUEtiapine 100 MG tablet Commonly known as: SEROQUEL Take 1 tablet (100 mg total) by mouth at bedtime. What changed:  medication strength how much to take Changed by: Elige Radon [name]   simvastatin 80 MG tablet Commonly known as: ZOCOR Take 1 tablet (80 mg total) by mouth daily.   triamcinolone cream 0.1 % Commonly known as: KENALOG Apply topically 2 (two) times daily as needed.   venlafaxine 75 MG tablet Commonly known as: EFFEXOR Take 1 tablet (75 mg total) by mouth daily.   Vitamin D3 25  MCG (1000 UT) Caps 2 capsules         Objective:   BP 133/81   Pulse 81   Ht 5\' 5"  (1.651 m)   Wt 170 lb (77.1 kg)   SpO2 98%   BMI 28.29 kg/m   Wt Readings from Last 3 Encounters:  04/21/23 170 lb (77.1 kg)  01/25/23 178 lb (80.7 kg)  10/05/22 163 lb (73.9 kg)    Physical Exam Vitals and nursing note reviewed.  Constitutional:      General: She is not in acute distress.    Appearance: Normal appearance.  HENT:     Head: Normocephalic and atraumatic.     Nose: No congestion.  Eyes:     Conjunctiva/sclera: Conjunctivae normal.  Neck:     Comments: Systolic murmur best heard at RUSB Cardiovascular:     Rate and Rhythm: Normal rate and regular rhythm.     Heart sounds: Murmur heard.  Pulmonary:     Effort: Pulmonary effort is normal. No respiratory distress.     Breath sounds: Normal breath  sounds.  Musculoskeletal:     Left lower leg: Edema present.     Comments: Mild non-pitting edema from venous insufficiency  Skin:    General: Skin is warm.     Comments: Discoloration from venous insufficiency on bilateral lower extremities  Neurological:     Mental Status: She is alert and oriented to person, place, and time.  Psychiatric:        Mood and Affect: Mood normal.        Behavior: Behavior normal.    Results for orders placed or performed in visit on 01/25/23  ToxASSURE Select 13 (MW), Urine  Result Value Ref Range   Summary Note    *Note: Due to a large number of results and/or encounters for the requested time period, some results have not been displayed. A complete set of results can be found in Results Review.    Assessment & Plan:   Problem List Items Addressed This Visit       Cardiovascular and Mediastinum   Essential hypertension - Primary     Endocrine   Hypothyroidism   Relevant Orders   TSH     Other   Depression, recurrent (HCC)   Relevant Medications   clonazePAM (KLONOPIN) 0.25 MG disintegrating tablet   QUEtiapine (SEROQUEL)  100 MG tablet   GAD (generalized anxiety disorder)   Relevant Medications   clonazePAM (KLONOPIN) 0.25 MG disintegrating tablet   QUEtiapine (SEROQUEL) 100 MG tablet   Pure hypercholesterolemia   Other Visit Diagnoses     Yeast dermatitis       Relevant Medications   fluconazole (DIFLUCAN) 150 MG tablet       Blood pressure well-controlled, so will continue current antihypertensive regimen. Patient tolerating simvastatin well. Will recheck lipids and adjust medication if needed. Will recheck TSH given patient report of heat intolerance and adjust levothyroxine if needed. Will increase quetiapine to 100 mg to help with insomnia and anxiety. Recommended patient try Mucinex and benadryl to help with secretions and irritation from tracheostomy. Prescribed fluconazole for intertriginous yeast.  Follow up plan: Return in about 3 months (around 07/22/2023), or if symptoms worsen or fail to improve, for Hypertension and thyroid recheck.  Counseling provided for all of the vaccine components Orders Placed This Encounter  Procedures   TSH    Gillermina Phy, Medical Student [facility] 04/21/2023, 5:17 PM

## 2023-04-24 ENCOUNTER — Ambulatory Visit: Payer: Medicare Other

## 2023-04-24 ENCOUNTER — Ambulatory Visit (INDEPENDENT_AMBULATORY_CARE_PROVIDER_SITE_OTHER): Payer: Medicare Other

## 2023-04-24 ENCOUNTER — Other Ambulatory Visit: Payer: Self-pay

## 2023-04-24 DIAGNOSIS — E890 Postprocedural hypothyroidism: Secondary | ICD-10-CM

## 2023-04-24 DIAGNOSIS — E538 Deficiency of other specified B group vitamins: Secondary | ICD-10-CM | POA: Diagnosis not present

## 2023-04-24 NOTE — Progress Notes (Signed)
Cyanocobalamin injection given to right deltoid.  Patient tolerated well. 

## 2023-04-25 ENCOUNTER — Other Ambulatory Visit: Payer: Self-pay | Admitting: Family Medicine

## 2023-04-25 LAB — TSH: TSH: 0.645 u[IU]/mL (ref 0.450–4.500)

## 2023-04-27 ENCOUNTER — Ambulatory Visit: Payer: Medicare Other | Admitting: Family Medicine

## 2023-05-03 DIAGNOSIS — K080 Exfoliation of teeth due to systemic causes: Secondary | ICD-10-CM | POA: Diagnosis not present

## 2023-05-08 DIAGNOSIS — Z85850 Personal history of malignant neoplasm of thyroid: Secondary | ICD-10-CM | POA: Diagnosis not present

## 2023-05-08 DIAGNOSIS — E890 Postprocedural hypothyroidism: Secondary | ICD-10-CM | POA: Diagnosis not present

## 2023-05-12 ENCOUNTER — Other Ambulatory Visit: Payer: Self-pay | Admitting: Family Medicine

## 2023-05-12 DIAGNOSIS — I10 Essential (primary) hypertension: Secondary | ICD-10-CM

## 2023-05-14 NOTE — Progress Notes (Unsigned)
Cardiology Office Note:   Date:  05/17/2023  ID:  JASPREET FUNT, DOB 16-Apr-1941, MRN [id number] PCP: [name], Elige Radon, MD  [facility] Providers Cardiologist:  None {  History of Present Illness:   [name] is a 82 y.o. female who presents for follow up after AVR and aortic surgery.  She was admitted in Jan 2019 for elective AVR and ascending root replacement.  Post op she had tamponade and VF arrest.  She required an RVAD.    She had a tracheostomy placed and did go to rehab.   Follow up echo demonstrated a normal EF.   There was moderate TR.  There is some RV dysfunction.   The AVR looked OK.  There was a moderate gradient across the valve.  This was in October 2023.       Since I last saw her she has had no acute complaints.  She denies any new shortness of breath, PND or orthopnea.  She has had no new palpitations, presyncope or syncope.  She has had no weight gain or edema.  She does her household chores and some shopping.    ROS: As stated in the HPI and negative for all other systems.  Studies Reviewed:    EKG:   EKG Interpretation Date/Time:  Wednesday May 17 2023 11:19:55 EST Ventricular Rate:  82 PR Interval:  158 QRS Duration:  148 QT Interval:  426 QTC Calculation: 497 R Axis:   -62  Text Interpretation: Normal sinus rhythm with sinus arrhythmia Left axis deviation Right bundle branch block When compared with ECG of 27-Dec-2017 11:33, No significant change since last tracing Confirmed by Rollene Rotunda ([id number]) on 05/17/2023 11:41:43 AM     Risk Assessment/Calculations:              Physical Exam:   VS:  BP 118/60   Pulse 82   Ht 5\' 5"  (1.651 m)   Wt 170 lb (77.1 kg)   BMI 28.29 kg/m    Wt Readings from Last 3 Encounters:  05/17/23 170 lb (77.1 kg)  04/21/23 170 lb (77.1 kg)  01/25/23 178 lb (80.7 kg)     GEN: Well nourished, well developed in no acute distress NECK: No JVD; No carotid bruits, tracheostomy in place CARDIAC: RRR, 3  out of 6 systolic murmur radiating at aortic outflow tract and early to mid peaking, no diastolic murmurs, rubs, gallops RESPIRATORY:  Clear to auscultation without rales, wheezing or rhonchi  ABDOMEN: Soft, non-tender, non-distended EXTREMITIES:  No edema; No deformity   ASSESSMENT AND PLAN:   AVR/AORTIC ROOT REPLACEMENT:    She does have a moderate gradient the normal valve function.  I am going to follow-up with an echocardiogram again in December.  She understands endocarditis prophylaxis.   RV FAILURE:   RV function in Nov 2023 was moderately reduced.  She does not have any symptoms related to this although it is a little bit difficult to assess for jugular veins.  She does have a little bit of mild lower extremity edema but this is chronic.  I think she is euvolemic and no change in therapy.  HTN:    The blood pressure at target.  No change in therapy.  RBBB: This is chronic.   She does have left axis deviation as well but she had no bradycardic symptoms.  She understands to present to the emergency room with any presyncope or syncope in the future.       Follow up  with me in one year.   Signed, Rollene Rotunda, MD

## 2023-05-15 ENCOUNTER — Other Ambulatory Visit: Payer: Self-pay | Admitting: Family Medicine

## 2023-05-15 DIAGNOSIS — H401111 Primary open-angle glaucoma, right eye, mild stage: Secondary | ICD-10-CM | POA: Diagnosis not present

## 2023-05-15 DIAGNOSIS — H40022 Open angle with borderline findings, high risk, left eye: Secondary | ICD-10-CM | POA: Diagnosis not present

## 2023-05-17 ENCOUNTER — Ambulatory Visit: Payer: Medicare Other | Admitting: Cardiology

## 2023-05-17 ENCOUNTER — Encounter: Payer: Self-pay | Admitting: Cardiology

## 2023-05-17 VITALS — BP 118/60 | HR 82 | Ht 65.0 in | Wt 170.0 lb

## 2023-05-17 DIAGNOSIS — Z952 Presence of prosthetic heart valve: Secondary | ICD-10-CM | POA: Diagnosis not present

## 2023-05-17 DIAGNOSIS — I10 Essential (primary) hypertension: Secondary | ICD-10-CM

## 2023-05-17 DIAGNOSIS — I50810 Right heart failure, unspecified: Secondary | ICD-10-CM

## 2023-05-17 NOTE — Patient Instructions (Addendum)
Medication Instructions:  The current medical regimen is effective;  continue present plan and medications.  *If you need a refill on your cardiac medications before your next appointment, please call your pharmacy*  Testing/Procedures: Your physician has requested that you have an echocardiogram. Echocardiography is a painless test that uses sound waves to create images of your heart. It provides your doctor with information about the size and shape of your heart and how well your heart's chambers and valves are working. This procedure takes approximately one hour. There are no restrictions for this procedure. Please do NOT wear cologne, perfume, aftershave, or lotions (deodorant is allowed). Please arrive 15 minutes prior to your appointment time.  Please note: We ask at that you not bring children with you during ultrasound (echo/ vascular) testing. Due to room size and safety concerns, children are not allowed in the ultrasound rooms during exams. Our front office staff cannot provide observation of children in our lobby area while testing is being conducted. An adult accompanying a patient to their appointment will only be allowed in the ultrasound room at the discretion of the ultrasound technician under special circumstances. We apologize for any inconvenience. This will be completed at [facility] in December.  You will be contacted to be scheduled.   Follow-Up: At [facility], you and your health needs are our priority.  As part of our continuing mission to provide you with exceptional heart care, we have created designated Provider Care Teams.  These Care Teams include your primary Cardiologist (physician) and Advanced Practice Providers (APPs -  Physician Assistants and Nurse Practitioners) who all work together to provide you with the care you need, when you need it.  We recommend signing up for the patient portal called "MyChart".  Sign up information is provided on this After  Visit Summary.  MyChart is used to connect with patients for Virtual Visits (Telemedicine).  Patients are able to view lab/test results, encounter notes, upcoming appointments, etc.  Non-urgent messages can be sent to your provider as well.   To learn more about what you can do with MyChart, go to [email or web address].    Your next appointment:   1 year(s)  Provider:   Rollene Rotunda, MD    Pt returned with medication bottles of which meds she is currently taking.  Medication list updated. Was not taking Furosemide, taking 1/2 tab of Seroquel.

## 2023-05-24 ENCOUNTER — Ambulatory Visit (INDEPENDENT_AMBULATORY_CARE_PROVIDER_SITE_OTHER): Payer: Medicare Other | Admitting: *Deleted

## 2023-05-24 DIAGNOSIS — E538 Deficiency of other specified B group vitamins: Secondary | ICD-10-CM | POA: Diagnosis not present

## 2023-05-24 NOTE — Progress Notes (Signed)
Patient is in office today for a nurse visit for B12 Injection. Patient Injection was given in the  Left deltoid. Patient tolerated injection well.

## 2023-05-31 DIAGNOSIS — Z930 Tracheostomy status: Secondary | ICD-10-CM | POA: Diagnosis not present

## 2023-06-09 ENCOUNTER — Other Ambulatory Visit: Payer: Self-pay | Admitting: Family Medicine

## 2023-06-09 DIAGNOSIS — B372 Candidiasis of skin and nail: Secondary | ICD-10-CM

## 2023-06-12 ENCOUNTER — Ambulatory Visit (HOSPITAL_COMMUNITY)
Admission: RE | Admit: 2023-06-12 | Discharge: 2023-06-12 | Disposition: A | Discharge status: Home / Self Care | Payer: Medicare Other | Source: Ambulatory Visit | Attending: Cardiology | Admitting: Cardiology

## 2023-06-12 DIAGNOSIS — I50810 Right heart failure, unspecified: Secondary | ICD-10-CM | POA: Insufficient documentation

## 2023-06-12 DIAGNOSIS — I10 Essential (primary) hypertension: Secondary | ICD-10-CM | POA: Insufficient documentation

## 2023-06-12 DIAGNOSIS — Z952 Presence of prosthetic heart valve: Secondary | ICD-10-CM | POA: Insufficient documentation

## 2023-06-12 LAB — ECHOCARDIOGRAM COMPLETE
AV Mean grad: 30.7 mm[Hg]
AV Peak grad: 54.2 mm[Hg]
Ao pk vel: 3.68 m/s
Area-P 1/2: 3.85 cm2
S' Lateral: 2.6 cm

## 2023-06-12 NOTE — Progress Notes (Signed)
*  PRELIMINARY RESULTS* Echocardiogram 2D Echocardiogram has been performed.  Stacey Drain 06/12/2023, 1:12 PM

## 2023-06-13 DIAGNOSIS — Z930 Tracheostomy status: Secondary | ICD-10-CM | POA: Diagnosis not present

## 2023-06-13 DIAGNOSIS — J386 Stenosis of larynx: Secondary | ICD-10-CM | POA: Diagnosis not present

## 2023-06-23 ENCOUNTER — Ambulatory Visit (INDEPENDENT_AMBULATORY_CARE_PROVIDER_SITE_OTHER): Payer: Medicare Other | Admitting: *Deleted

## 2023-06-23 DIAGNOSIS — E538 Deficiency of other specified B group vitamins: Secondary | ICD-10-CM | POA: Diagnosis not present

## 2023-06-23 MED ORDER — CYANOCOBALAMIN 1000 MCG/ML IJ SOLN
1000.0000 ug | Freq: Once | INTRAMUSCULAR | Status: AC
  Administered 2023-06-23: 1000 ug via INTRAMUSCULAR

## 2023-06-23 NOTE — Progress Notes (Signed)
 Patient in today for monthly B 12 injection. Tolerated well.

## 2023-06-29 ENCOUNTER — Other Ambulatory Visit: Payer: Self-pay | Admitting: Family Medicine

## 2023-07-05 DIAGNOSIS — E890 Postprocedural hypothyroidism: Secondary | ICD-10-CM | POA: Diagnosis not present

## 2023-07-20 ENCOUNTER — Ambulatory Visit: Payer: Medicare Other | Admitting: Family Medicine

## 2023-07-20 ENCOUNTER — Encounter: Payer: Self-pay | Admitting: Family Medicine

## 2023-07-20 VITALS — BP 127/77 | HR 81 | Ht 65.0 in | Wt 171.0 lb

## 2023-07-20 DIAGNOSIS — E890 Postprocedural hypothyroidism: Secondary | ICD-10-CM

## 2023-07-20 DIAGNOSIS — I10 Essential (primary) hypertension: Secondary | ICD-10-CM

## 2023-07-20 DIAGNOSIS — E7800 Pure hypercholesterolemia, unspecified: Secondary | ICD-10-CM

## 2023-07-20 DIAGNOSIS — E538 Deficiency of other specified B group vitamins: Secondary | ICD-10-CM | POA: Diagnosis not present

## 2023-07-20 DIAGNOSIS — F339 Major depressive disorder, recurrent, unspecified: Secondary | ICD-10-CM | POA: Diagnosis not present

## 2023-07-20 DIAGNOSIS — F411 Generalized anxiety disorder: Secondary | ICD-10-CM

## 2023-07-20 MED ORDER — CLONAZEPAM 0.25 MG PO TBDP: 0.2500 mg | ORAL_TABLET | Freq: Three times a day (TID) | ORAL | 2 refills | Status: DC | PRN

## 2023-07-20 MED ORDER — CYANOCOBALAMIN 1000 MCG/ML IJ SOLN
1000.0000 ug | Freq: Once | INTRAMUSCULAR | Status: AC
  Administered 2023-07-20: 1000 ug via INTRAMUSCULAR

## 2023-07-20 MED ORDER — VENLAFAXINE HCL 75 MG PO TABS: 75.0000 mg | ORAL_TABLET | Freq: Every day | ORAL | 3 refills | Status: AC

## 2023-07-20 NOTE — Progress Notes (Signed)
BP 127/77   Pulse 81   Ht 5\' 5"  (1.651 m)   Wt 171 lb (77.6 kg)   SpO2 96%   BMI 28.46 kg/m    Subjective:   Patient ID: [name], female    DOB: 21-Aug-1940, 83 y.o.   MRN: [id number]  HPI: [name] is a 83 y.o. female presenting on 07/20/2023 for Medical Management of Chronic Issues, Hypertension, and b12 deficiency   HPI Hypertension Patient is currently on no medication currently, diet control, and their blood pressure today is 127/70. Patient denies any lightheadedness or dizziness. Patient denies headaches, blurred vision, chest pains, shortness of breath, or weakness. Denies any side effects from medication and is content with current medication.   GERD Patient is currently on no medicine currently, diet control.  She denies any major symptoms or abdominal pain or belching or burping. She denies any blood in her stool or lightheadedness or dizziness.   Anxiety and depression recheck Current rx-clonazepam 0.25 mg 3 times daily as needed and quetiapine 100 mg nightly and Effexor 75 mg daily # meds rx-90 clonazepam per month Effectiveness of current meds-works well, some Adverse reactions form meds-none currently Pill count performed-No Last drug screen -02/02/2023 ( high risk q66m, moderate risk q43m, low risk yearly ) Urine drug screen today- No Was the NCCSR reviewed-yes  If yes were their any concerning findings? -Filled a day or 2 early but has been mostly pretty consistent Controlled substance contract signed on: 02/02/2023  Relevant past medical, surgical, family and social history reviewed and updated as indicated. Interim medical history since our last visit reviewed. Allergies and medications reviewed and updated.  Review of Systems  Constitutional:  Negative for chills and fever.  HENT:  Negative for congestion, ear discharge and ear pain.   Eyes:  Negative for redness and visual disturbance.  Respiratory:  Negative for chest tightness and shortness of  breath.   Cardiovascular:  Negative for chest pain and leg swelling.  Genitourinary:  Negative for difficulty urinating and dysuria.  Musculoskeletal:  Negative for back pain and gait problem.  Skin:  Negative for rash.  Neurological:  Negative for dizziness, light-headedness and headaches.  Psychiatric/Behavioral:  Negative for agitation and behavioral problems.   All other systems reviewed and are negative.   Per HPI unless specifically indicated above   Allergies as of 07/20/2023       Reactions   Lipitor [atorvastatin Calcium] Other (See Comments)   myalgias   Lyrica [pregabalin] Other (See Comments)   SORES IN MOUTH    Motrin [ibuprofen] Other (See Comments)   Pt denies   Sibutramine Other (See Comments)   UNSPECIFIED REACTION  UNSPECIFIED REACTION  Reaction unknown   Sibutramine Hcl Monohydrate Other (See Comments)   UNSPECIFIED REACTION    Alendronate Sodium Nausea Only   Latex Rash, Other (See Comments)   RA to latex 1982; includes bandaids    Levofloxacin Nausea And Vomiting   Meloxicam Other (See Comments)   Vaginal itching         Medication List        Accurate as of July 20, 2023 12:11 PM. If you have any questions, ask your nurse or doctor.          Anucort-HC 25 MG suppository Generic drug: hydrocortisone INSERT 1 SUPPOSITORY RECTALLY TWICE DAILY   aspirin 81 MG chewable tablet Chew 81 mg by mouth at bedtime.   calcium carbonate 1250 (500 Ca) MG tablet Commonly known as: OS-CAL -  dosed in mg of elemental calcium Take 1 tablet (500 mg of elemental calcium total) by mouth daily with breakfast.   clonazePAM 0.25 MG disintegrating tablet Commonly known as: KLONOPIN Take 1 tablet (0.25 mg total) by mouth 3 (three) times daily as needed for seizure. Please cancel out any other prescriptions on file   docusate sodium 250 MG capsule Commonly known as: COLACE Take 250 mg by mouth daily.   fluconazole 150 MG tablet Commonly known as:  Diflucan Take 1 tablet (150 mg total) by mouth once a week.   furosemide 20 MG tablet Commonly known as: LASIX Take 20 mg by mouth. Take 1/2 tablet by mouth on Mon. Wed and Fri   guaiFENesin 100 MG/5ML liquid Commonly known as: ROBITUSSIN Take 5 mLs by mouth every 4 (four) hours as needed for cough or to loosen phlegm.   hydrocortisone 2.5 % rectal cream Commonly known as: ANUSOL-HC APPLY RECTALLY TWICE DAILY AS NEEDED   ketoconazole 2 % cream Commonly known as: NIZORAL Apply topically daily.   lactulose 10 GM/15ML solution Commonly known as: CHRONULAC TAKE 30 MLS DAILY AS DIRECTED What changed: See the new instructions.   latanoprost 0.005 % ophthalmic solution Commonly known as: XALATAN Place 1 drop into both eyes at bedtime.   levothyroxine 100 MCG tablet Commonly known as: SYNTHROID Take 88 mcg by mouth daily.   linaclotide 72 MCG capsule Commonly known as: Linzess Take 1 capsule (72 mcg total) by mouth daily before breakfast.   loratadine 10 MG tablet Commonly known as: CLARITIN TAKE ONE TABLET DAILY   magnesium oxide 400 (241.3 Mg) MG tablet Commonly known as: MAG-OX Take 1 tablet (400 mg total) by mouth daily.   multivitamin with minerals Tabs tablet Take 1 tablet by mouth daily.   nystatin powder Generic drug: nystatin APPLY TO THE AFFECTED AREA(S) THREE TIMES DAILY   potassium chloride 10 MEQ tablet Commonly known as: KLOR-CON TAKE TWO TABLETS TWICE DAILY   QUEtiapine 100 MG tablet Commonly known as: SEROQUEL Take 1 tablet (100 mg total) by mouth at bedtime. What changed: how much to take   simvastatin 80 MG tablet Commonly known as: ZOCOR Take 1 tablet (80 mg total) by mouth daily.   Travoprost (BAK Free) 0.004 % Soln ophthalmic solution Commonly known as: TRAVATAN 1 drop at bedtime.   triamcinolone cream 0.1 % Commonly known as: KENALOG Apply topically 2 (two) times daily as needed.   venlafaxine 75 MG tablet Commonly known as:  EFFEXOR Take 1 tablet (75 mg total) by mouth daily.   Vitamin D3 25 MCG (1000 UT) Caps 2 capsules         Objective:   BP 127/77   Pulse 81   Ht 5\' 5"  (1.651 m)   Wt 171 lb (77.6 kg)   SpO2 96%   BMI 28.46 kg/m   Wt Readings from Last 3 Encounters:  07/20/23 171 lb (77.6 kg)  05/17/23 170 lb (77.1 kg)  04/21/23 170 lb (77.1 kg)    Physical Exam Vitals and nursing note reviewed.  Constitutional:      General: She is not in acute distress.    Appearance: She is well-developed. She is not diaphoretic.  HENT:     Head:     Comments: Tracheostomy in place and intact and looks clean Eyes:     Conjunctiva/sclera: Conjunctivae normal.  Cardiovascular:     Rate and Rhythm: Normal rate and regular rhythm.     Heart sounds: Normal heart sounds. No murmur  heard. Pulmonary:     Effort: Pulmonary effort is normal. No respiratory distress.     Breath sounds: Normal breath sounds. No wheezing.  Musculoskeletal:        General: No tenderness. Normal range of motion.  Skin:    General: Skin is warm and dry.     Findings: No rash.  Neurological:     Mental Status: She is alert and oriented to person, place, and time.     Coordination: Coordination normal.  Psychiatric:        Behavior: Behavior normal.       Assessment & Plan:   Problem List Items Addressed This Visit       Cardiovascular and Mediastinum   Essential hypertension   Relevant Orders   CBC with Differential/Platelet   CMP14+EGFR     Endocrine   Hypothyroidism   Relevant Orders   TSH     Other   Depression, recurrent (HCC)   Relevant Medications   clonazePAM (KLONOPIN) 0.25 MG disintegrating tablet   venlafaxine (EFFEXOR) 75 MG tablet   GAD (generalized anxiety disorder)   Relevant Medications   clonazePAM (KLONOPIN) 0.25 MG disintegrating tablet   venlafaxine (EFFEXOR) 75 MG tablet   Pure hypercholesterolemia   Relevant Orders   Lipid panel   Vitamin B 12 deficiency - Primary   Relevant  Medications   cyanocobalamin (VITAMIN B12) injection 1,000 mcg (Start on 07/20/2023 12:15 PM)   Other Relevant Orders   Vitamin B12    Continue current medicine, seems to be doing well, will check blood work today. Follow up plan: Return in about 4 months (around 11/08/2023), or if symptoms worsen or fail to improve, for Hypertension anxiety and depression.  Counseling provided for all of the vaccine components Orders Placed This Encounter  Procedures   Vitamin B12   CBC with Differential/Platelet   CMP14+EGFR   Lipid panel   TSH    Arville Care, MD [facility] 07/20/2023, 12:11 PM

## 2023-07-21 ENCOUNTER — Encounter: Payer: Self-pay | Admitting: Family Medicine

## 2023-07-21 LAB — CMP14+EGFR
ALT: 18 [IU]/L (ref 0–32)
AST: 28 [IU]/L (ref 0–40)
Albumin: 4.6 g/dL (ref 3.7–4.7)
Alkaline Phosphatase: 110 [IU]/L (ref 44–121)
BUN/Creatinine Ratio: 14 (ref 12–28)
BUN: 16 mg/dL (ref 8–27)
Bilirubin Total: 0.6 mg/dL (ref 0.0–1.2)
CO2: 25 mmol/L (ref 20–29)
Calcium: 8.7 mg/dL (ref 8.7–10.3)
Chloride: 103 mmol/L (ref 96–106)
Creatinine, Ser: 1.11 mg/dL — ABNORMAL HIGH (ref 0.57–1.00)
Globulin, Total: 2.1 g/dL (ref 1.5–4.5)
Glucose: 63 mg/dL — ABNORMAL LOW (ref 70–99)
Potassium: 4 mmol/L (ref 3.5–5.2)
Sodium: 145 mmol/L — ABNORMAL HIGH (ref 134–144)
Total Protein: 6.7 g/dL (ref 6.0–8.5)
eGFR: 50 mL/min/{1.73_m2} — ABNORMAL LOW (ref >59)

## 2023-07-21 LAB — CBC WITH DIFFERENTIAL/PLATELET
Basophils Absolute: 0 10*3/uL (ref 0.0–0.2)
Basos: 1 %
EOS (ABSOLUTE): 0 10*3/uL (ref 0.0–0.4)
Eos: 1 %
Hematocrit: 38.9 % (ref 34.0–46.6)
Hemoglobin: 12.8 g/dL (ref 11.1–15.9)
Immature Grans (Abs): 0 10*3/uL (ref 0.0–0.1)
Immature Granulocytes: 0 %
Lymphocytes Absolute: 0.9 10*3/uL (ref 0.7–3.1)
Lymphs: 14 %
MCH: 29 pg (ref 26.6–33.0)
MCHC: 32.9 g/dL (ref 31.5–35.7)
MCV: 88 fL (ref 79–97)
Monocytes Absolute: 0.5 10*3/uL (ref 0.1–0.9)
Monocytes: 7 %
Neutrophils Absolute: 4.9 10*3/uL (ref 1.4–7.0)
Neutrophils: 77 %
Platelets: 210 10*3/uL (ref 150–450)
RBC: 4.42 x10E6/uL (ref 3.77–5.28)
RDW: 13.1 % (ref 11.7–15.4)
WBC: 6.4 10*3/uL (ref 3.4–10.8)

## 2023-07-21 LAB — LIPID PANEL
Chol/HDL Ratio: 2.3 {ratio} (ref 0.0–4.4)
Cholesterol, Total: 169 mg/dL (ref 100–199)
HDL: 73 mg/dL (ref >39)
LDL Chol Calc (NIH): 78 mg/dL (ref 0–99)
Triglycerides: 102 mg/dL (ref 0–149)
VLDL Cholesterol Cal: 18 mg/dL (ref 5–40)

## 2023-07-21 LAB — TSH: TSH: 1.55 u[IU]/mL (ref 0.450–4.500)

## 2023-07-21 LAB — VITAMIN B12: Vitamin B-12: >2000 pg/mL — ABNORMAL HIGH (ref 232–1245)

## 2023-08-03 ENCOUNTER — Encounter: Payer: Self-pay | Admitting: Nurse Practitioner

## 2023-08-03 ENCOUNTER — Ambulatory Visit: Payer: Medicare Other | Admitting: Nurse Practitioner

## 2023-08-03 VITALS — BP 116/69 | HR 96 | Temp 101.5°F | Ht 65.0 in | Wt 171.4 lb

## 2023-08-03 DIAGNOSIS — R051 Acute cough: Secondary | ICD-10-CM | POA: Diagnosis not present

## 2023-08-03 DIAGNOSIS — R0981 Nasal congestion: Secondary | ICD-10-CM | POA: Diagnosis not present

## 2023-08-03 DIAGNOSIS — R509 Fever, unspecified: Secondary | ICD-10-CM | POA: Diagnosis not present

## 2023-08-03 DIAGNOSIS — J101 Influenza due to other identified influenza virus with other respiratory manifestations: Secondary | ICD-10-CM | POA: Diagnosis not present

## 2023-08-03 LAB — VERITOR FLU A/B WAIVED
Influenza A: POSITIVE — AB
Influenza B: NEGATIVE

## 2023-08-03 MED ORDER — GUAIFENESIN 100 MG/5ML PO LIQD: 5.0000 mL | ORAL | 0 refills | Status: AC | PRN

## 2023-08-03 MED ORDER — OSELTAMIVIR PHOSPHATE 75 MG PO CAPS: 75.0000 mg | ORAL_CAPSULE | Freq: Two times a day (BID) | ORAL | 0 refills | Status: AC

## 2023-08-03 NOTE — Progress Notes (Signed)
 Acute Office Visit  Subjective:     Patient ID: [name], female    DOB: Jan 07, 1941, 83 y.o.   MRN: [id number]  Chief Complaint  Patient presents with   Cough    Symptoms started yesterday   Nasal Congestion    HPI  [name] is a 83 y.o. female present with her husband on 08/03/2023 for an acute complaining of congestion, myalgia, nasal blockage, dry cough, and fever for 2 days. She denies a history of dizziness, fatigue, nausea, and vomiting and denies a history of asthma. Patient denies smoke cigarettes. POC influenza A & B order to r/o flu   Active Ambulatory Problems    Diagnosis Date Noted   Depression, recurrent (HCC) 02/23/2010   GLAUCOMA 02/23/2010   CATARACTS 02/23/2010   RHEUMATIC FEVER 02/23/2010   Essential hypertension 02/23/2010   MITRAL VALVE PROLAPSE 02/23/2010   GERD 02/23/2010   Osteoarthritis 02/23/2010   Primary fibromyalgia syndrome 02/23/2010   Pathological fracture due to osteoporosis 04/16/2014   Thyroid  cancer (HCC) 07/30/2015   Ascending aortic aneurysm (HCC) 08/20/2015   Vitamin B 12 deficiency 03/01/2016   S/P AVR 07/10/2017   RVF (right ventricular failure) (HCC)    Tracheostomy dependence (HCC) 07/19/2018   PAF (paroxysmal atrial fibrillation) (HCC)    Diastolic CHF (HCC)    Posterior glottic stenosis 10/03/2017   GAD (generalized anxiety disorder) 01/18/2019   Pure hypercholesterolemia 01/18/2019   Bilateral vocal fold paralysis 07/19/2018   Vitamin D  deficiency 01/18/2019   History of prosthetic aortic valve 10/15/2019   Hypothyroidism 05/15/2020   History of papillary adenocarcinoma of thyroid  09/23/2020   Hypoparathyroidism (HCC) 09/23/2020   Resolved Ambulatory Problems    Diagnosis Date Noted   Hyperlipidemia 02/23/2010   CHEST PAIN, PRECORDIAL 02/26/2010   Proximal humerus fracture 05/30/2011   At high risk for falls 04/16/2014   Left thyroid  nodule 07/27/2015   Nephrolithiasis 04/07/2017   RVAD (right  ventricular assist device) present [facility])    Cardiogenic shock (HCC)    Acute respiratory failure (HCC)    HCAP (healthcare-associated pneumonia)    Acute encephalopathy    Pressure injury of skin 08/04/2017   Weakness 08/12/2017   Dysphagia    Anxiety and depression    Airway obstruction    Shortness of breath 01/17/2019   Stridor    Tracheostomy status (HCC)    Hypokalemia 12/27/2017   Hypomagnesemia 12/27/2017   Hypocalcemia 12/27/2017   Sensory disturbance 12/27/2017   Leg swelling 01/17/2018   Senile purpura (HCC) 02/02/2018   BMI 28.0-28.9,adult 01/18/2019   Dysphonia 07/19/2018   Hypothyroidism, postop 01/18/2019   Constipation 09/23/2020   Tracheostomy care (HCC) 09/23/2020   Encounter for removal of sutures 11/11/2020   Past Medical History:  Diagnosis Date   ACL tear    Adenomatous polyp    Anxiety    Arthritis    Asthma    B12 deficiency    Chronic bronchitis (HCC)    Depression    DUB (dysfunctional uterine bleeding) 10/96   Fibromyalgia    GERD (gastroesophageal reflux disease)    Glaucoma    Helicobacter pylori (H. pylori)    Hiatal hernia    History of blood transfusion 06/2017   History of kidney stones    History of left shoulder fracture    Hypertension    Inspiratory stridor    Laryngeal stenosis    Occasional tremors    Osteopenia    Osteoporosis    Thyroid  nodule  Tracheostomy in place [facility])     ROS Negative unless indicated in HPI    Objective:    BP 116/69   Pulse 96   Temp (!) 101.5 F (38.6 C) (Temporal)   Ht 5' 5 (1.651 m)   Wt 171 lb 6.4 oz (77.7 kg)   SpO2 94%   BMI 28.52 kg/m  BP Readings from Last 3 Encounters:  08/03/23 116/69  07/20/23 127/77  05/17/23 118/60   Wt Readings from Last 3 Encounters:  08/03/23 171 lb 6.4 oz (77.7 kg)  07/20/23 171 lb (77.6 kg)  05/17/23 170 lb (77.1 kg)      Physical Exam She appears well, vital signs are as noted. Ears normal.  Throat and pharynx normal.  Has a trech. No  adenopathy in the neck. Nose is congested. Sinuses non tender. The chest is clear, without wheezes or rales. No results found for any visits on 08/03/23.      Assessment & Plan:  Influenza A virus present -     Oseltamivir  Phosphate; Take 1 capsule (75 mg total) by mouth 2 (two) times daily for 5 days.  Dispense: 10 capsule; Refill: 0  Acute cough -     Veritor Flu A/B Waived -     guaiFENesin ; Take 5 mLs by mouth every 4 (four) hours as needed for cough or to loosen phlegm.  Dispense: 120 mL; Refill: 0  Nasal congestion -     Veritor Flu A/B Waived -     guaiFENesin ; Take 5 mLs by mouth every 4 (four) hours as needed for cough or to loosen phlegm.  Dispense: 120 mL; Refill: 0  Fever, unspecified fever cause -     Veritor Flu A/B Waived -     Oseltamivir  Phosphate; Take 1 capsule (75 mg total) by mouth 2 (two) times daily for 5 days.  Dispense: 10 capsule; Refill: 0   [name] 51-year-old Caucasian female seen today for viral URI, no acute symptoms Viral upper respiratory illness and influenza A POC positive for flu A will treat for weight Tamiflu  75 mg twice daily for 5 days; cough will to guaifenesin  100 mg 5 ml every 4 hrs PRN for cough Symptomatic therapy suggested: push fluids, rest, use acetaminophen , ibuprofen prn for fever, apply heat to sinuses prn, and return office visit prn if symptoms persist or worsen. Lack of antibiotic effectiveness discussed with her. Call or return to clinic prn if these symptoms worsen or fail to improve as anticipated.    The above assessment and management plan was discussed with the patient. The patient verbalized understanding of and has agreed to the management plan. Patient is aware to call the clinic if they develop any new symptoms or if symptoms persist or worsen. Patient is aware when to return to the clinic for a follow-up visit. Patient educated on when it is appropriate to go to the emergency department.  Return if symptoms worsen or fail  to improve.  [name] [place] Thompson, DNP [facility] [address] [place], [place] [address] [phone]  Note: This document was prepared by [employer] voice dictation technology and any errors that results from this process are unintentional.

## 2023-08-10 ENCOUNTER — Other Ambulatory Visit: Payer: Self-pay | Admitting: Family Medicine

## 2023-08-10 DIAGNOSIS — I10 Essential (primary) hypertension: Secondary | ICD-10-CM

## 2023-08-16 ENCOUNTER — Other Ambulatory Visit: Payer: Self-pay | Admitting: Family Medicine

## 2023-08-17 ENCOUNTER — Ambulatory Visit (INDEPENDENT_AMBULATORY_CARE_PROVIDER_SITE_OTHER): Payer: Medicare Other | Admitting: *Deleted

## 2023-08-17 DIAGNOSIS — E538 Deficiency of other specified B group vitamins: Secondary | ICD-10-CM | POA: Diagnosis not present

## 2023-08-17 MED ORDER — CYANOCOBALAMIN 1000 MCG/ML IJ SOLN
1000.0000 ug | INTRAMUSCULAR | Status: AC
  Administered 2023-08-17 – 2024-06-28 (×10): 1000 ug via INTRAMUSCULAR

## 2023-08-17 NOTE — Progress Notes (Signed)
Agree with above Arville Care, MD [facility] 08/17/2023, 2:53 PM

## 2023-08-17 NOTE — Progress Notes (Signed)
 Patient is in office today for a nurse visit for B12 Injection. Patient Injection was given in the  Right deltoid. Patient tolerated injection well.

## 2023-08-30 ENCOUNTER — Other Ambulatory Visit: Payer: Self-pay | Admitting: Family Medicine

## 2023-08-30 DIAGNOSIS — R269 Unspecified abnormalities of gait and mobility: Secondary | ICD-10-CM | POA: Diagnosis not present

## 2023-08-30 DIAGNOSIS — R5381 Other malaise: Secondary | ICD-10-CM | POA: Diagnosis not present

## 2023-08-30 DIAGNOSIS — Z930 Tracheostomy status: Secondary | ICD-10-CM | POA: Diagnosis not present

## 2023-09-11 DIAGNOSIS — J386 Stenosis of larynx: Secondary | ICD-10-CM | POA: Diagnosis not present

## 2023-09-11 DIAGNOSIS — Z930 Tracheostomy status: Secondary | ICD-10-CM | POA: Diagnosis not present

## 2023-09-15 ENCOUNTER — Other Ambulatory Visit: Payer: Self-pay | Admitting: *Deleted

## 2023-09-15 ENCOUNTER — Ambulatory Visit: Payer: Medicare Other | Admitting: *Deleted

## 2023-09-15 DIAGNOSIS — E538 Deficiency of other specified B group vitamins: Secondary | ICD-10-CM

## 2023-09-15 MED ORDER — HYDROCORTISONE (PERIANAL) 2.5 % EX CREA: TOPICAL_CREAM | CUTANEOUS | 1 refills | Status: DC

## 2023-09-15 NOTE — Progress Notes (Signed)
 Patient is in office today for a nurse visit for B12 Injection. Patient Injection was given in the  Left deltoid. Patient tolerated injection well.

## 2023-09-15 NOTE — Telephone Encounter (Signed)
 Pt in today for B12 injection needing refill sent to [facility]

## 2023-10-06 ENCOUNTER — Ambulatory Visit (INDEPENDENT_AMBULATORY_CARE_PROVIDER_SITE_OTHER)

## 2023-10-06 VITALS — Ht 65.0 in | Wt 176.0 lb

## 2023-10-06 DIAGNOSIS — Z780 Asymptomatic menopausal state: Secondary | ICD-10-CM

## 2023-10-06 DIAGNOSIS — Z0000 Encounter for general adult medical examination without abnormal findings: Secondary | ICD-10-CM | POA: Diagnosis not present

## 2023-10-06 NOTE — Progress Notes (Signed)
 Because this visit was a virtual/telehealth visit,  certain criteria was not obtained, such a blood pressure, CBG if applicable, and timed get up and go. Any medications not marked as "taking" were not mentioned during the medication reconciliation part of the visit. Any vitals not documented were not able to be obtained due to this being a telehealth visit or patient was unable to self-report a recent blood pressure reading due to a lack of equipment at home via telehealth. Vitals that have been documented are verbally provided by the patient.   Subjective:   [name] is a 83 y.o. who presents for a Medicare Wellness preventive visit.  Visit Complete: Virtual I connected with  Carola Frost on 10/06/23 by a audio enabled telemedicine application and verified that I am speaking with the correct person using two identifiers.  Patient Location: Home  Provider Location: Home Office  I discussed the limitations of evaluation and management by telemedicine. The patient expressed understanding and agreed to proceed.  Vital Signs: Because this visit was a virtual/telehealth visit, some criteria may be missing or patient reported. Any vitals not documented were not able to be obtained and vitals that have been documented are patient reported.  VideoDeclined- This patient declined Librarian, academic. Therefore the visit was completed with audio only.  Persons Participating in Visit: Patient.  AWV Questionnaire: No: Patient Medicare AWV questionnaire was not completed prior to this visit.  Cardiac Risk Factors include: advanced age (>28men, >106 women);dyslipidemia;hypertension     Objective:    Today's Vitals   10/06/23 0847  Weight: 176 lb (79.8 kg)  Height: 5\' 5"  (1.651 m)   Body mass index is 29.29 kg/m.     10/06/2023    8:53 AM 10/05/2022    9:33 AM 09/28/2021   11:28 AM 10/30/2020   12:58 PM 09/24/2020    4:06 PM 04/17/2019   10:25 AM 04/12/2018     2:54 PM  Advanced Directives  Does Patient Have a Medical Advance Directive? No Yes No No No No No  Type of Furniture conservator/restorer;Living will       Copy of Healthcare Power of Attorney in Chart?  No - copy requested       Would patient like information on creating a medical advance directive? Yes (MAU/Ambulatory/Procedural Areas - Information given)  No - Patient declined No - Patient declined No - Patient declined No - Patient declined No - Patient declined    Current Medications (verified) Outpatient Encounter Medications as of 10/06/2023  Medication Sig   ANUCORT-HC 25 MG suppository INSERT 1 SUPPOSITORY RECTALLY TWICE DAILY   aspirin 81 MG chewable tablet Chew 81 mg by mouth at bedtime.    calcium carbonate (OS-CAL - DOSED IN MG OF ELEMENTAL CALCIUM) 1250 (500 Ca) MG tablet Take 1 tablet (500 mg of elemental calcium total) by mouth daily with breakfast.   Cholecalciferol (VITAMIN D3) 25 MCG (1000 UT) CAPS 2 capsules   clonazePAM (KLONOPIN) 0.25 MG disintegrating tablet Take 1 tablet (0.25 mg total) by mouth 3 (three) times daily as needed for seizure. Please cancel out any other prescriptions on file   docusate sodium (COLACE) 250 MG capsule Take 250 mg by mouth daily.   fluconazole (DIFLUCAN) 150 MG tablet Take 1 tablet (150 mg total) by mouth once a week.   furosemide (LASIX) 20 MG tablet Take 20 mg by mouth. Take 1/2 tablet by mouth on Mon. Wed and Fri  furosemide (LASIX) 40 MG tablet TAKE ONE TABLET DAILY   guaiFENesin (ROBITUSSIN) 100 MG/5ML liquid Take 5 mLs by mouth every 4 (four) hours as needed for cough or to loosen phlegm.   hydrocortisone (ANUSOL-HC) 2.5 % rectal cream APPLY RECTALLY TWICE DAILY AS NEEDED   ketoconazole (NIZORAL) 2 % cream Apply topically daily.   lactulose (CONSTULOSE) 10 GM/15ML solution Take 30 mLs (20 g total) by mouth daily as needed.   latanoprost (XALATAN) 0.005 % ophthalmic solution Place 1 drop into both eyes at bedtime.    levothyroxine (SYNTHROID) 100 MCG tablet Take 88 mcg by mouth daily.   linaclotide (LINZESS) 72 MCG capsule Take 1 capsule (72 mcg total) by mouth daily before breakfast.   loratadine (CLARITIN) 10 MG tablet TAKE ONE TABLET DAILY   magnesium oxide (MAG-OX) 400 (241.3 Mg) MG tablet Take 1 tablet (400 mg total) by mouth daily.   Multiple Vitamin (MULTIVITAMIN WITH MINERALS) TABS tablet Take 1 tablet by mouth daily.   nystatin powder APPLY TO THE AFFECTED AREA(S) THREE TIMES DAILY   potassium chloride (KLOR-CON) 10 MEQ tablet TAKE TWO TABLETS TWICE DAILY   QUEtiapine (SEROQUEL) 100 MG tablet Take 1 tablet (100 mg total) by mouth at bedtime. (Patient taking differently: Take 50 mg by mouth at bedtime.)   simvastatin (ZOCOR) 80 MG tablet Take 1 tablet (80 mg total) by mouth daily.   Travoprost, BAK Free, (TRAVATAN) 0.004 % SOLN ophthalmic solution 1 drop at bedtime.   triamcinolone (KENALOG) 0.1 % Apply topically 2 (two) times daily as needed.   venlafaxine (EFFEXOR) 75 MG tablet Take 1 tablet (75 mg total) by mouth daily.   Facility-Administered Encounter Medications as of 10/06/2023  Medication   cyanocobalamin (VITAMIN B12) injection 1,000 mcg    Allergies (verified) Lipitor [atorvastatin calcium], Lyrica [pregabalin], Motrin [ibuprofen], Sibutramine, Sibutramine hcl monohydrate, Alendronate sodium, Latex, Levofloxacin, and Meloxicam   History: Past Medical History:  Diagnosis Date   ACL tear    right  Dr. Lowella Curb    Adenomatous polyp    Anxiety    Arthritis    "all over" (04/12/2018)   Ascending aortic aneurysm (HCC)    note per chart per Dr Zenaida Niece Tright 4.7 cm 04/15/2015    Asthma    B12 deficiency    Chronic bronchitis (HCC)    Depression    DUB (dysfunctional uterine bleeding) 10/96   Fibromyalgia    GERD (gastroesophageal reflux disease)    Glaucoma    bilaterally   Helicobacter pylori (H. pylori)    Hiatal hernia    History of blood transfusion 06/2017   "related to OHS"    History of kidney stones    History of left shoulder fracture    pt states fell off bed and broke ball in shoulder had rod placed    Hyperlipidemia    Hypertension    Hypothyroidism    Inspiratory stridor    Laryngeal stenosis    Occasional tremors    left arm    Osteoarthritis    Osteopenia    Osteoporosis    Thyroid cancer (HCC) 2017   Thyroid nodule    Tracheostomy in place [facility])    Past Surgical History:  Procedure Laterality Date   AORTIC VALVE REPLACEMENT N/A 07/10/2017   Procedure: AORTIC VALVE REPLACEMENT (AVR);  Surgeon: Donata Clay, Theron Arista, MD;  Location: [facility] OR;  Service: Open Heart Surgery;  Laterality: N/A;   APPENDECTOMY     BUNIONECTOMY  08/1999   right - Dr. Ulice Brilliant  CARDIAC VALVE REPLACEMENT     CATARACT EXTRACTION, BILATERAL Bilateral 05/28/07-2009   "right-left"   CHOLECYSTECTOMY OPEN  1979   DILATION AND CURETTAGE OF UTERUS  03/31/95   Dr. Adron Bene    DIRECT LARYNGOSCOPY WITH BOTOX INJECTION N/A 12/01/2017   Procedure: SUSPENDED DIRECT MICROLARYNGOSCOPY WITH KENALOG INJECTION, CO2 LASER;  Surgeon: Christia Reading, MD;  Location: [facility] OR;  Service: ENT;  Laterality: N/A;   EXCISIONAL HEMORRHOIDECTOMY     EXPLORATION POST OPERATIVE OPEN HEART N/A 07/11/2017   Procedure: EXPLORATION POST OPERATIVE OPEN HEART;  Surgeon: Kerin Perna, MD;  Location[facility] OR;  Service: Open Heart Surgery;  Laterality: N/A;   EYE SURGERY     also had left cataract removed    FRACTURE SURGERY     HEMATOMA EVACUATION N/A 07/11/2017   Procedure: EVACUATION HEMATOMA;  Surgeon: Kerin Perna, MD;  Location: [facility] OR;  Service: Open Heart Surgery;  Laterality: N/A;   HERNIA REPAIR     JOINT REPLACEMENT     MICROLARYNGOSCOPY WITH LASER AND BALLOON DILATION N/A 01/24/2018   Procedure: MICROLARYNGOSCOPY WITH LASER AND BALLOON DILATION;  Surgeon: Christia Reading, MD;  Location: [facility] OR;  Service: ENT;  Laterality: N/A;   MICROLARYNGOSCOPY WITH LASER AND BALLOON DILATION N/A 04/13/2018   Procedure:  MICROLARYNGOSCOPY WITH BALLOON DILATION;  Surgeon: Christia Reading, MD;  Location: [facility] OR;  Service: ENT;  Laterality: N/A;   NEPHROLITHOTOMY Left 04/07/2017   Procedure: NEPHROLITHOTOMY PERCUTANEOUS WITH SURGEON ACCESS;  Surgeon: Crist Fat, MD;  Location: [facility] ORS;  Service: Urology;  Laterality: Left;   ORIF HUMERUS FRACTURE  05/30/2011   Procedure: OPEN REDUCTION INTERNAL FIXATION (ORIF) PROXIMAL HUMERUS FRACTURE;  Surgeon: Verlee Rossetti;  Location: [facility] OR;  Service: Orthopedics;  Laterality: Left;  open reduction internal fixation of proximal humerus fracture   PLACEMENT OF CENTRIMAG VENTRICULAR ASSIST DEVICE Right 07/11/2017   Procedure: PLACEMENT OF CENTRIMAG VENTRICULAR ASSIST DEVICE;  Surgeon: Kerin Perna, MD;  Location: [facility] OR;  Service: Open Heart Surgery;  Laterality: Right;   REMOVAL OF CENTRIMAG VENTRICULAR ASSIST DEVICE N/A 07/17/2017   Procedure: REMOVAL OF RVAD WITH PUMP STANDBY;  Surgeon: Kerin Perna, MD;  Location: [facility] OR;  Service: Open Heart Surgery;  Laterality: N/A;   REPLACEMENT ASCENDING AORTA N/A 07/10/2017   Procedure: REPLACEMENT ASCENDING AORTA;  Surgeon: Kerin Perna, MD;  Location: [facility] OR;  Service: Open Heart Surgery;  Laterality: N/A;   RIGHT/LEFT HEART CATH AND CORONARY ANGIOGRAPHY N/A 06/13/2017   Procedure: RIGHT/LEFT HEART CATH AND CORONARY ANGIOGRAPHY;  Surgeon: Dolores Patty, MD;  Location: [facility] INVASIVE CV LAB;  Service: Cardiovascular;  Laterality: N/A;   TEE WITHOUT CARDIOVERSION N/A 07/10/2017   Procedure: TRANSESOPHAGEAL ECHOCARDIOGRAM (TEE);  Surgeon: Donata Clay, Theron Arista, MD;  Location: [facility] OR;  Service: Open Heart Surgery;  Laterality: N/A;   TEE WITHOUT CARDIOVERSION  07/11/2017   Procedure: TRANSESOPHAGEAL ECHOCARDIOGRAM (TEE);  Surgeon: Donata Clay, Theron Arista, MD;  Location: [facility] OR;  Service: Open Heart Surgery;;   TEE WITHOUT CARDIOVERSION N/A 07/17/2017   Procedure: TRANSESOPHAGEAL ECHOCARDIOGRAM (TEE);  Surgeon: Donata Clay, Theron Arista, MD;  Location:  [facility] OR;  Service: Open Heart Surgery;  Laterality: N/A;   THYROID LOBECTOMY Left 07/27/2015   Procedure: LEFT THYROID LOBECTOMY;  Surgeon: Claud Kelp, MD;  Location: [facility] ORS;  Service: General;  Laterality: Left;   THYROIDECTOMY N/A 08/06/2015   Procedure: RIGHT THYROID LOBECTOMY, REIMPLANTATION PARATHYROID;  Surgeon: Claud Kelp, MD;  Location: [facility] ORS;  Service: General;  Laterality: N/A;  TOTAL KNEE ARTHROPLASTY Right 08/2009   Dr. Lowella Curb   TRACHEOSTOMY  08/08/2017   VENTRAL HERNIA REPAIR  2/99   Family History  Problem Relation Age of Onset   Cancer Mother        PANCREATIC    Osteoporosis Mother    Hip fracture Mother    Heart attack Father 90       Died 36   Heart disease Father    Arthritis Father    Hypertension Sister    Cancer Sister        skin   Hypertension Sister    Cancer Sister        breast cancer   Breast cancer Neg Hx    Social History   Socioeconomic History   Marital status: Married    Spouse name: Jimmy   Number of children: 1   Years of education: 11   Highest education level: 11th grade  Occupational History   Occupation: homemaker  Tobacco Use   Smoking status: Never   Smokeless tobacco: Never  Vaping Use   Vaping status: Never Used  Substance and Sexual Activity   Alcohol use: No   Drug use: Never   Sexual activity: Not Currently    Birth control/protection: Post-menopausal  Other Topics Concern   Not on file  Social History Narrative   Lives with husband.    Social Drivers of Corporate investment banker Strain: Low Risk  (10/06/2023)   Overall Financial Resource Strain (CARDIA)    Difficulty of Paying Living Expenses: Not hard at all  Food Insecurity: No Food Insecurity (10/06/2023)   Hunger Vital Sign    Worried About Running Out of Food in the Last Year: Never true    Ran Out of Food in the Last Year: Never true  Transportation Needs: No Transportation Needs (10/06/2023)   PRAPARE - Administrator, Civil Service  (Medical): No    Lack of Transportation (Non-Medical): No  Physical Activity: Insufficiently Active (10/06/2023)   Exercise Vital Sign    Days of Exercise per Week: 3 days    Minutes of Exercise per Session: 30 min  Stress: No Stress Concern Present (10/06/2023)   [employer] of Occupational Health - Occupational Stress Questionnaire    Feeling of Stress : Not at all  Social Connections: Moderately Integrated (10/06/2023)   Social Connection and Isolation Panel [NHANES]    Frequency of Communication with Friends and Family: More than three times a week    Frequency of Social Gatherings with Friends and Family: More than three times a week    Attends Religious Services: More than 4 times per year    Active Member of [employer] or Organizations: No    Attends Engineer, structural: Never    Marital Status: Married    Tobacco Counseling Counseling given: Yes    Clinical Intake:  Pre-visit preparation completed: Yes  Pain : No/denies pain     BMI - recorded: 29.29 Nutritional Risks: None Diabetes: No  Lab Results  Component Value Date   HGBA1C 5.2 07/06/2017   HGBA1C 5.0 12/15/2015     How often do you need to have someone help you when you read instructions, pamphlets, or other written materials from your doctor or pharmacy?: 1 - Never  Interpreter Needed?: No  Information entered by :: Maryjean Ka CMA   Activities of Daily Living     10/06/2023    8:54 AM  In your present state of health,  do you have any difficulty performing the following activities:  Hearing? 0  Vision? 0  Difficulty concentrating or making decisions? 0  Walking or climbing stairs? 0  Dressing or bathing? 0  Doing errands, shopping? 1  Comment Husband goes with her  Preparing Food and eating ? Y  Comment husband assist with housekeeping and cooking  Using the Toilet? N  In the past six months, have you accidently leaked urine? Y  Do you have problems with loss of bowel control? Y   Managing your Medications? Y  Managing your Finances? Y  Housekeeping or managing your Housekeeping? Y    Patient Care Team: [name], Elige Radon, MD as PCP - General (Family Medicine) Rollene Rotunda, MD as Consulting Physician (Cardiology) Michaelle Copas, MD as Referring Physician (Optometry) Christia Reading, MD as Consulting Physician (Otolaryngology) Randa Spike Kelton Pillar, [name] as Social Worker (Licensed Clinical Social Worker) Talmage Coin, MD as Consulting Physician (Endocrinology)  Indicate any recent Medical Services you may have received from other than [facility] providers in the past year (date may be approximate).     Assessment:   This is a routine wellness examination for [place].  Hearing/Vision screen Hearing Screening - Comments:: Patient denies any hearing difficulties.   Vision Screening - Comments:: Patient wears reading glasses only. Up to date with yearly exams.  Patient sees [facility] [place] in [place] and also sees [facility]   Goals Addressed             This Visit's Progress    DIET - INCREASE WATER INTAKE   On track    Try to drink 6-8 glasses of water daily.     Patient Stated       Go on vacation to [facility]       Depression Screen     10/06/2023    8:57 AM 07/20/2023   11:56 AM 04/21/2023    4:29 PM 01/25/2023   10:08 AM 10/05/2022    9:32 AM 09/16/2022    9:50 AM 06/16/2022   11:12 AM  PHQ 2/9 Scores  PHQ - 2 Score 3 0 0 0 0 0 0  PHQ- 9 Score 4  0 0 0      Fall Risk     10/06/2023    8:52 AM 07/20/2023   11:56 AM 01/25/2023   10:08 AM 10/05/2022    9:30 AM 09/16/2022    9:50 AM  Fall Risk   Falls in the past year? 0 0 0 0 0  Number falls in past yr: 0   0   Injury with Fall? 0   0   Risk for fall due to : Impaired balance/gait;Orthopedic patient;Impaired mobility   No Fall Risks   Follow up Falls prevention discussed;Education provided   Falls prevention discussed     MEDICARE RISK AT  HOME:  Medicare Risk at Home Any stairs in or around the home?: No If so, are there any without handrails?: No Home free of loose throw rugs in walkways, pet beds, electrical cords, etc?: No (pt states they have a large rug in the living room) Adequate lighting in your home to reduce risk of falls?: Yes Life alert?: No Use of a cane, walker or w/c?: Yes Grab bars in the bathroom?: Yes Shower chair or bench in shower?: Yes Elevated toilet seat or a handicapped toilet?: Yes  TIMED UP AND GO:  Was the test performed?  No  Cognitive Function: 6CIT  completed    03/12/2018    3:32 PM 12/30/2016    1:43 PM  MMSE - Mini Mental State Exam  Orientation to time 5 5  Orientation to Place 5 4  Registration 3 3  Attention/ Calculation 4 5  Recall 3 3  Language- name 2 objects 2 2  Language- repeat 1 1  Language- follow 3 step command 3 3  Language- read & follow direction 1 1  Write a sentence 0 0  Write a sentence-comments unable to write or draw due to hand trembling   Copy design 0 1  Copy design-comments unable to write due to hand tremor   Total score 27 28        10/06/2023    8:50 AM 10/05/2022    9:33 AM 09/28/2021   11:25 AM 04/17/2019   10:32 AM  6CIT Screen  What Year? 0 points 0 points 0 points 0 points  What month? 0 points 0 points 0 points 0 points  What time? 0 points 0 points 0 points 0 points  Count back from 20 0 points 0 points 0 points 0 points  Months in reverse 0 points 0 points 4 points 2 points  Repeat phrase 0 points 0 points 4 points 4 points  Total Score 0 points 0 points 8 points 6 points    Immunizations Immunization History  Administered Date(s) Administered   Fluad Quad(high Dose 65+) 04/01/2019, 04/10/2020, 03/18/2021, 03/23/2022   Fluad Trivalent(High Dose 65+) 03/23/2023   Fluzone Influenza virus vaccine,trivalent (IIV3), split virus 06/23/2011   Influenza Split 03/18/2016, 04/15/2020   Influenza, High Dose Seasonal PF 04/04/2018, 04/04/2018    Influenza, Seasonal, Injecte, Preservative Fre 04/07/2009, 03/17/2010, 04/18/2012   Influenza,inj,Quad PF,6+ Mos 03/27/2013, 04/01/2014, 03/31/2015, 03/22/2016   Moderna Sars-Covid-2 Vaccination 07/16/2019, 08/13/2019, 09/30/2019, 10/21/2019, 05/06/2020   Pneumococcal Conjugate-13 11/15/2013   Pneumococcal Polysaccharide-23 09/06/2007   Td 01/17/2014   Zoster Recombinant(Shingrix) 12/14/2020, 09/17/2021    Screening Tests Health Maintenance  Topic Date Due   COVID-19 Vaccine (6 - 2024-25 season) 02/26/2023   DEXA SCAN  09/30/2023   DTaP/Tdap/Td (2 - Tdap) 01/18/2024   INFLUENZA VACCINE  01/26/2024   Medicare Annual Wellness (AWV)  10/05/2024   Pneumonia Vaccine 30+ Years old  Completed   Zoster Vaccines- Shingrix  Completed   HPV VACCINES  Aged Out   Meningococcal B Vaccine  Aged Out    Health Maintenance  Health Maintenance Due  Topic Date Due   COVID-19 Vaccine (6 - 2024-25 season) 02/26/2023   DEXA SCAN  09/30/2023   Health Maintenance Items Addressed: DEXA ordered Advised patient of recommended Covid Booster with verbal understanding.   Additional Screening:  Vision Screening: Recommended annual ophthalmology exams for early detection of glaucoma and other disorders of the eye. Patient is UTD on yearly eye exams.   Dental Screening: Recommended annual dental exams for proper oral hygiene  Community Resource Referral / Chronic Care Management: CRR required this visit?  No   CCM required this visit?  No     Plan:     I have personally reviewed and noted the following in the patient's chart:   Medical and social history Use of alcohol, tobacco or illicit drugs  Current medications and supplements including opioid prescriptions. Patient is not currently taking opioid prescriptions. Functional ability and status Nutritional status Physical activity Advanced directives List of other physicians Hospitalizations, surgeries, and ER visits in previous 12  months Vitals Screenings to include cognitive, depression, and falls Referrals and  appointments  In addition, I have reviewed and discussed with patient certain preventive protocols, quality metrics, and best practice recommendations. A written personalized care plan for preventive services as well as general preventive health recommendations were provided to patient.     Jordan Hawks Lorra Freeman, CMA   10/06/2023   After Visit Summary: (MyChart) Due to this being a telephonic visit, the after visit summary with patients personalized plan was offered to patient via MyChart   Notes: Please refer to Routing Comments.

## 2023-10-06 NOTE — Patient Instructions (Signed)
 [name] , Thank you for taking time to come for your Medicare Wellness Visit. I appreciate your ongoing commitment to your health goals. Please review the following plan we discussed and let me know if I can assist you in the future.   Please see your treatment plan below: Referrals:none placed this visit Preventative Screenings: Your osteoporosis screening has been ordered for you. Please call [facility] to schedule that.  Please remember the following! -Discontinue any medications that contain calcium at least 48 hours (2 days) prior to your scan. -Bring list of medications you are currently taking including any supplements.  -Make sure you wear comfortable 2 piece clothing    Labs:up to date Follow-Up:October 11, 2024 at 12:30 pm video visit Clinician Recommendations: Aim for 30 minutes of exercise or brisk walking, 6-8 glasses of water, and 5 servings of fruits and vegetables each day. It is recommended you have the current Covid booster. If you'd like to get that, you can have that done at your preferred local pharmacy.   This is a list of the screening recommended for you and due dates:  Health Maintenance  Topic Date Due   COVID-19 Vaccine (6 - 2024-25 season) 02/26/2023   DEXA scan (bone density measurement)  09/30/2023   DTaP/Tdap/Td vaccine (2 - Tdap) 01/18/2024   Flu Shot  01/26/2024   Medicare Annual Wellness Visit  10/05/2024   Pneumonia Vaccine  Completed   Zoster (Shingles) Vaccine  Completed   HPV Vaccine  Aged Out   Meningitis B Vaccine  Aged Out    Advanced directives: (Provided) Advance directive discussed with you today. I have provided a copy for you to complete at home and have notarized. Once this is complete, please bring a copy in to our office so we can scan it into your chart.    Advance Care Planning is important because it:  [x]  Makes sure you receive the medical care that is consistent with your values, goals, and preferences  [x]  It provides guidance to  your family and loved ones and it also reduces their decisional burden about whether or not they are making the right decisions based on what you want done  Follow the link provided in your after visit summary or read over the paperwork we have mailed to you to help you started getting your Advance Directives in place. If you need assistance in completing these, please reach out to [place] so that we can help you!   Next Medicare Annual Wellness Visit scheduled for next year: yes  Understanding Your Risk for Falls Millions of people have serious injuries from falls each year. It is important to understand your risk of falling. Talk with your health care provider about your risk and what you can do to lower it. If you do have a serious fall, make sure to tell your provider. Falling once raises your risk of falling again. How can falls affect me? Serious injuries from falls are common. These include: Broken bones, such as hip fractures. Head injuries, such as traumatic brain injuries (TBI) or concussions. A fear of falling can cause you to avoid activities and stay at home. This can make your muscles weaker and raise your risk for a fall. What can increase my risk? There are a number of risk factors that increase your risk for falling. The more risk factors you have, the higher your risk of falling. Serious injuries from a fall happen most often to people who are older than 81 years old.  Teenagers and young adults ages 26-29 are also at higher risk. Common risk factors include: Weakness in the lower body. Being generally weak or confused due to long-term (chronic) illness. Dizziness or balance problems. Poor vision. Medicines that cause dizziness or drowsiness. These may include: Medicines for your blood pressure, heart, anxiety, insomnia, or swelling (edema). Pain medicines. Muscle relaxants. Other risk factors include: Drinking alcohol. Having had a fall in the past. Having foot pain or  wearing improper footwear. Working at a dangerous job. Having any of the following in your home: Tripping hazards, such as floor clutter or loose rugs. Poor lighting. Pets. Having dementia or memory loss. What actions can I take to lower my risk of falling?     Physical activity Stay physically fit. Do strength and balance exercises. Consider taking a regular class to build strength and balance. Yoga and tai chi are good options. Vision Have your eyes checked every year and your prescription for glasses or contacts updated as needed. Shoes and walking aids Wear non-skid shoes. Wear shoes that have rubber soles and low heels. Do not wear high heels. Do not walk around the house in socks or slippers. Use a cane or walker as told by your provider. Home safety Attach secure railings on both sides of your stairs. Install grab bars for your bathtub, shower, and toilet. Use a non-skid mat in your bathtub or shower. Attach bath mats securely with double-sided, non-slip rug tape. Use good lighting in all rooms. Keep a flashlight near your bed. Make sure there is a clear path from your bed to the bathroom. Use night-lights. Do not use throw rugs. Make sure all carpeting is taped or tacked down securely. Remove all clutter from walkways and stairways, including extension cords. Repair uneven or broken steps and floors. Avoid walking on icy or slippery surfaces. Walk on the grass instead of on icy or slick sidewalks. Use ice melter to get rid of ice on walkways in the winter. Use a cordless phone. Questions to ask your health care provider Can you help me check my risk for a fall? Do any of my medicines make me more likely to fall? Should I take a vitamin D supplement? What exercises can I do to improve my strength and balance? Should I make an appointment to have my vision checked? Do I need a bone density test to check for weak bones (osteoporosis)? Would it help to use a cane or a  walker? Where to find more information Centers for Disease Control and Prevention, [employer]: TonerPromos.no Community-Based Fall Prevention Programs: TonerPromos.no [employer] on Aging: BaseRingTones.pl Contact a health care provider if: You fall at home. You are afraid of falling at home. You feel weak, drowsy, or dizzy. This information is not intended to replace advice given to you by your health care provider. Make sure you discuss any questions you have with your health care provider. Document Revised: 02/14/2022 Document Reviewed: 02/14/2022 Elsevier Patient Education  2024 [employer].

## 2023-10-16 ENCOUNTER — Ambulatory Visit (INDEPENDENT_AMBULATORY_CARE_PROVIDER_SITE_OTHER)

## 2023-10-16 DIAGNOSIS — E538 Deficiency of other specified B group vitamins: Secondary | ICD-10-CM | POA: Diagnosis not present

## 2023-10-16 NOTE — Progress Notes (Signed)
 Patient is in office today for a nurse visit for B12 Injection. Patient Injection was given in the  Right deltoid. Patient tolerated injection well.

## 2023-11-02 ENCOUNTER — Encounter (HOSPITAL_COMMUNITY): Payer: Self-pay

## 2023-11-03 ENCOUNTER — Other Ambulatory Visit: Payer: Self-pay | Admitting: Family Medicine

## 2023-11-03 DIAGNOSIS — F411 Generalized anxiety disorder: Secondary | ICD-10-CM

## 2023-11-03 DIAGNOSIS — F339 Major depressive disorder, recurrent, unspecified: Secondary | ICD-10-CM

## 2023-11-08 DIAGNOSIS — K080 Exfoliation of teeth due to systemic causes: Secondary | ICD-10-CM | POA: Diagnosis not present

## 2023-11-09 ENCOUNTER — Encounter: Payer: Self-pay | Admitting: Family Medicine

## 2023-11-09 ENCOUNTER — Other Ambulatory Visit: Payer: Self-pay | Admitting: Family Medicine

## 2023-11-09 ENCOUNTER — Ambulatory Visit (INDEPENDENT_AMBULATORY_CARE_PROVIDER_SITE_OTHER): Payer: Medicare Other | Admitting: Family Medicine

## 2023-11-09 ENCOUNTER — Ambulatory Visit (INDEPENDENT_AMBULATORY_CARE_PROVIDER_SITE_OTHER)

## 2023-11-09 VITALS — BP 123/61 | HR 88 | Ht 65.0 in | Wt 178.0 lb

## 2023-11-09 DIAGNOSIS — F411 Generalized anxiety disorder: Secondary | ICD-10-CM

## 2023-11-09 DIAGNOSIS — I480 Paroxysmal atrial fibrillation: Secondary | ICD-10-CM | POA: Diagnosis not present

## 2023-11-09 DIAGNOSIS — I10 Essential (primary) hypertension: Secondary | ICD-10-CM

## 2023-11-09 DIAGNOSIS — F339 Major depressive disorder, recurrent, unspecified: Secondary | ICD-10-CM | POA: Diagnosis not present

## 2023-11-09 DIAGNOSIS — E890 Postprocedural hypothyroidism: Secondary | ICD-10-CM

## 2023-11-09 DIAGNOSIS — Z0000 Encounter for general adult medical examination without abnormal findings: Secondary | ICD-10-CM

## 2023-11-09 DIAGNOSIS — R300 Dysuria: Secondary | ICD-10-CM

## 2023-11-09 DIAGNOSIS — Z780 Asymptomatic menopausal state: Secondary | ICD-10-CM

## 2023-11-09 DIAGNOSIS — E7800 Pure hypercholesterolemia, unspecified: Secondary | ICD-10-CM

## 2023-11-09 LAB — MICROSCOPIC EXAMINATION
RBC, Urine: NONE SEEN /HPF (ref 0–2)
Renal Epithel, UA: NONE SEEN /HPF
Yeast, UA: NONE SEEN

## 2023-11-09 LAB — URINALYSIS, COMPLETE
Bilirubin, UA: NEGATIVE
Glucose, UA: NEGATIVE
Ketones, UA: NEGATIVE
Nitrite, UA: NEGATIVE
Protein,UA: NEGATIVE
RBC, UA: NEGATIVE
Specific Gravity, UA: 1.015 (ref 1.005–1.030)
Urobilinogen, Ur: 0.2 mg/dL (ref 0.2–1.0)
pH, UA: 5.5 (ref 5.0–7.5)

## 2023-11-09 LAB — LIPID PANEL

## 2023-11-09 MED ORDER — QUETIAPINE FUMARATE 50 MG PO TABS: 50.0000 mg | ORAL_TABLET | Freq: Every day | ORAL | 3 refills | Status: AC

## 2023-11-09 MED ORDER — SIMVASTATIN 80 MG PO TABS: 80.0000 mg | ORAL_TABLET | Freq: Every day | ORAL | 3 refills | Status: AC

## 2023-11-09 MED ORDER — CLONAZEPAM 0.25 MG PO TBDP: 0.2500 mg | ORAL_TABLET | Freq: Three times a day (TID) | ORAL | 3 refills | Status: DC | PRN

## 2023-11-09 NOTE — Progress Notes (Addendum)
 BP 123/61   Pulse 88   Ht 5\' 5"  (1.651 m)   Wt 178 lb (80.7 kg)   SpO2 94%   BMI 29.62 kg/m    Subjective:   Patient ID: [name], female    DOB: [birth year], 83 y.o.   MRN: [id number]  HPI: [name] is a 83 y.o. female presenting on 11/09/2023 for Medical Management of Chronic Issues, Hypertension, and Depression   HPI Anxiety depression Current rx-venlafaxine  and Seroquel  and clonazepam  0.25 mg 3 times daily as needed # meds rx-90 of the clonazepam  per month Effectiveness of current meds-works well Adverse reactions form meds-none Pill count performed-No Last drug screen -02/02/2023 ( high risk q23m, moderate risk q78m, low risk yearly ) Urine drug screen today- No Was the [facility] reviewed-yes  If yes were their any concerning findings? -None Controlled substance contract signed on: 02/02/2023  Hypertension and paroxysmal A-fib Patient is currently on furosemide  and simvastatin  and aspirin , and their blood pressure today is 123/61. Patient denies any lightheadedness or dizziness. Patient denies headaches, blurred vision, chest pains, shortness of breath, or weakness. Denies any side effects from medication and is content with current medication.   Hyperlipidemia Patient is coming in for recheck of his hyperlipidemia. The patient is currently taking simvastatin . They deny any issues with myalgias or history of liver damage from it. They deny any focal numbness or weakness or chest pain.   Hypothyroidism recheck with history of thyroid  cancer Patient is coming in for thyroid  recheck today as well. They deny any issues with hair changes or heat or cold problems or diarrhea or constipation. They deny any chest pain or palpitations. They are currently on levothyroxine  100 micrograms   Relevant past medical, surgical, family and social history reviewed and updated as indicated. Interim medical history since our last visit reviewed. Allergies and medications reviewed and  updated.  Review of Systems  Constitutional:  Negative for chills and fever.  HENT:  Negative for congestion, ear discharge and ear pain.   Eyes:  Negative for visual disturbance.  Respiratory:  Negative for chest tightness and shortness of breath.   Cardiovascular:  Negative for chest pain and leg swelling.  Genitourinary:  Negative for difficulty urinating and dysuria.  Musculoskeletal:  Negative for back pain and gait problem.  Skin:  Negative for rash.  Neurological:  Negative for dizziness, light-headedness and headaches.  Psychiatric/Behavioral:  Positive for sleep disturbance. Negative for agitation, behavioral problems, dysphoric mood, self-injury and suicidal ideas. The patient is nervous/anxious.   All other systems reviewed and are negative.   Per HPI unless specifically indicated above   Allergies as of 11/09/2023       Reactions   Lipitor [atorvastatin  Calcium ] Other (See Comments)   myalgias   Lyrica [pregabalin] Other (See Comments)   SORES IN MOUTH    Motrin [ibuprofen] Other (See Comments)   Pt denies   Sibutramine Other (See Comments)   UNSPECIFIED REACTION  UNSPECIFIED REACTION  Reaction unknown   Sibutramine Hcl Monohydrate Other (See Comments)   UNSPECIFIED REACTION    Alendronate Sodium Nausea Only   Latex Rash, Other (See Comments)   RA to latex 1982; includes bandaids    Levofloxacin Nausea And Vomiting   Meloxicam Other (See Comments)   Vaginal itching         Medication List        Accurate as of Nov 09, 2023 12:00 PM. If you have any questions, ask your nurse or doctor.  Anucort-HC  25 MG suppository Generic drug: hydrocortisone  INSERT 1 SUPPOSITORY RECTALLY TWICE DAILY   aspirin  81 MG chewable tablet Chew 81 mg by mouth at bedtime.   calcium  carbonate 1250 (500 Ca) MG tablet Commonly known as: OS-CAL - dosed in mg of elemental calcium  Take 1 tablet (500 mg of elemental calcium  total) by mouth daily with breakfast.    clonazePAM  0.25 MG disintegrating tablet Commonly known as: KLONOPIN  Take 1 tablet (0.25 mg total) by mouth 3 (three) times daily as needed for seizure. Please cancel out any other prescriptions on file   docusate sodium  250 MG capsule Commonly known as: COLACE Take 250 mg by mouth daily.   fluconazole  150 MG tablet Commonly known as: Diflucan  Take 1 tablet (150 mg total) by mouth once a week.   furosemide  20 MG tablet Commonly known as: LASIX  Take 20 mg by mouth. Take 1/2 tablet by mouth on Mon. Wed and Fri   furosemide  40 MG tablet Commonly known as: LASIX  TAKE ONE TABLET DAILY   guaiFENesin  100 MG/5ML liquid Commonly known as: ROBITUSSIN Take 5 mLs by mouth every 4 (four) hours as needed for cough or to loosen phlegm.   hydrocortisone  2.5 % rectal cream Commonly known as: ANUSOL -HC APPLY RECTALLY TWICE DAILY AS NEEDED   ketoconazole  2 % cream Commonly known as: NIZORAL  Apply topically daily.   lactulose  10 GM/15ML solution Commonly known as: Constulose  Take 30 mLs (20 g total) by mouth daily as needed.   latanoprost  0.005 % ophthalmic solution Commonly known as: XALATAN  Place 1 drop into both eyes at bedtime.   levothyroxine  100 MCG tablet Commonly known as: SYNTHROID  Take 88 mcg by mouth daily.   linaclotide  72 MCG capsule Commonly known as: Linzess  Take 1 capsule (72 mcg total) by mouth daily before breakfast.   loratadine  10 MG tablet Commonly known as: CLARITIN  TAKE ONE TABLET DAILY   magnesium  oxide 400 (241.3 Mg) MG tablet Commonly known as: MAG-OX Take 1 tablet (400 mg total) by mouth daily.   multivitamin with minerals Tabs tablet Take 1 tablet by mouth daily.   nystatin  powder Generic drug: nystatin  APPLY TO THE AFFECTED AREA(S) THREE TIMES DAILY   potassium chloride  10 MEQ tablet Commonly known as: KLOR-CON  TAKE TWO TABLETS TWICE DAILY   QUEtiapine  50 MG tablet Commonly known as: SEROQUEL  Take 1 tablet (50 mg total) by mouth at  bedtime. What changed:  medication strength how much to take Changed by: Lucio Sabin Sinia Antosh   simvastatin  80 MG tablet Commonly known as: ZOCOR  Take 1 tablet (80 mg total) by mouth daily.   Travoprost  (BAK Free) 0.004 % Soln ophthalmic solution Commonly known as: TRAVATAN  1 drop at bedtime.   triamcinolone  cream 0.1 % Commonly known as: KENALOG  Apply topically 2 (two) times daily as needed.   venlafaxine  75 MG tablet Commonly known as: EFFEXOR  Take 1 tablet (75 mg total) by mouth daily.   Vitamin D3 25 MCG (1000 UT) Caps 2 capsules         Objective:   BP 123/61   Pulse 88   Ht 5\' 5"  (1.651 m)   Wt 178 lb (80.7 kg)   SpO2 94%   BMI 29.62 kg/m   Wt Readings from Last 3 Encounters:  11/09/23 178 lb (80.7 kg)  10/06/23 176 lb (79.8 kg)  08/03/23 171 lb 6.4 oz (77.7 kg)    Physical Exam Vitals and nursing note reviewed.  Constitutional:      General: She is not in acute distress.  Appearance: She is well-developed. She is not diaphoretic.     Comments: Tracheostomy in place  Eyes:     Conjunctiva/sclera: Conjunctivae normal.  Cardiovascular:     Rate and Rhythm: Normal rate and regular rhythm.     Heart sounds: Murmur heard.  Pulmonary:     Effort: Pulmonary effort is normal. No respiratory distress.     Breath sounds: Normal breath sounds. No wheezing.  Musculoskeletal:        General: No tenderness. Normal range of motion.  Skin:    General: Skin is warm and dry.     Findings: No rash.  Neurological:     Mental Status: She is alert and oriented to person, place, and time.     Coordination: Coordination normal.  Psychiatric:        Behavior: Behavior normal.       Assessment & Plan:   Problem List Items Addressed This Visit       Cardiovascular and Mediastinum   Essential hypertension   Relevant Medications   simvastatin  (ZOCOR ) 80 MG tablet   Other Relevant Orders   CBC with Differential/Platelet   CMP14+EGFR   Lipid panel   TSH    PAF (paroxysmal atrial fibrillation) (HCC)   Relevant Medications   simvastatin  (ZOCOR ) 80 MG tablet     Endocrine   Hypothyroidism   Relevant Orders   CBC with Differential/Platelet   CMP14+EGFR   Lipid panel   TSH     Other   Depression, recurrent (HCC) - Primary   Relevant Medications   clonazePAM  (KLONOPIN ) 0.25 MG disintegrating tablet   QUEtiapine  (SEROQUEL ) 50 MG tablet   Other Relevant Orders   CBC with Differential/Platelet   CMP14+EGFR   Lipid panel   TSH   GAD (generalized anxiety disorder)   Relevant Medications   clonazePAM  (KLONOPIN ) 0.25 MG disintegrating tablet   QUEtiapine  (SEROQUEL ) 50 MG tablet   Pure hypercholesterolemia   Relevant Medications   simvastatin  (ZOCOR ) 80 MG tablet   Other Relevant Orders   Lipid panel    Continue current medicine, no changes, blood pressure and everything looks decent today, recommended for her to stay hydrated better Follow up plan: Return in about 4 months (around 03/11/2024), or if symptoms worsen or fail to improve, for Anxiety and hypertension and cholesterol.  Counseling provided for all of the vaccine components Orders Placed This Encounter  Procedures   CBC with Differential/Platelet   CMP14+EGFR   Lipid panel   TSH    Jolyne Needs, MD [facility] 11/09/2023, 12:00 PM

## 2023-11-09 NOTE — Addendum Note (Signed)
 Addended by: Jolyne Needs on: 11/09/2023 12:01 PM   Modules accepted: Orders

## 2023-11-09 NOTE — Addendum Note (Signed)
 Addended by: Jolyne Needs on: 11/09/2023 12:15 PM   Modules accepted: Orders

## 2023-11-10 DIAGNOSIS — Z780 Asymptomatic menopausal state: Secondary | ICD-10-CM | POA: Diagnosis not present

## 2023-11-10 DIAGNOSIS — M810 Age-related osteoporosis without current pathological fracture: Secondary | ICD-10-CM | POA: Diagnosis not present

## 2023-11-10 LAB — CBC WITH DIFFERENTIAL/PLATELET
Basophils Absolute: 0 10*3/uL (ref 0.0–0.2)
Basos: 1 %
EOS (ABSOLUTE): 0.2 10*3/uL (ref 0.0–0.4)
Eos: 3 %
Hematocrit: 37.4 % (ref 34.0–46.6)
Hemoglobin: 12.2 g/dL (ref 11.1–15.9)
Immature Grans (Abs): 0 10*3/uL (ref 0.0–0.1)
Immature Granulocytes: 0 %
Lymphocytes Absolute: 1.4 10*3/uL (ref 0.7–3.1)
Lymphs: 22 %
MCH: 29.1 pg (ref 26.6–33.0)
MCHC: 32.6 g/dL (ref 31.5–35.7)
MCV: 89 fL (ref 79–97)
Monocytes Absolute: 0.5 10*3/uL (ref 0.1–0.9)
Monocytes: 9 %
Neutrophils Absolute: 4 10*3/uL (ref 1.4–7.0)
Neutrophils: 65 %
Platelets: 286 10*3/uL (ref 150–450)
RBC: 4.19 x10E6/uL (ref 3.77–5.28)
RDW: 13 % (ref 11.7–15.4)
WBC: 6.2 10*3/uL (ref 3.4–10.8)

## 2023-11-10 LAB — LIPID PANEL
Cholesterol, Total: 157 mg/dL (ref 100–199)
HDL: 68 mg/dL (ref >39)
LDL CALC COMMENT:: 2.3 ratio (ref 0.0–4.4)
LDL Chol Calc (NIH): 68 mg/dL (ref 0–99)
Triglycerides: 121 mg/dL (ref 0–149)
VLDL Cholesterol Cal: 21 mg/dL (ref 5–40)

## 2023-11-10 LAB — CMP14+EGFR
ALT: 11 IU/L (ref 0–32)
AST: 23 IU/L (ref 0–40)
Albumin: 4.3 g/dL (ref 3.7–4.7)
Alkaline Phosphatase: 135 IU/L — ABNORMAL HIGH (ref 44–121)
BUN/Creatinine Ratio: 12 (ref 12–28)
BUN: 11 mg/dL (ref 8–27)
Bilirubin Total: 0.3 mg/dL (ref 0.0–1.2)
CO2: 26 mmol/L (ref 20–29)
Calcium: 8.8 mg/dL (ref 8.7–10.3)
Chloride: 100 mmol/L (ref 96–106)
Creatinine, Ser: 0.94 mg/dL (ref 0.57–1.00)
Globulin, Total: 2.4 g/dL (ref 1.5–4.5)
Glucose: 64 mg/dL — ABNORMAL LOW (ref 70–99)
Potassium: 3.9 mmol/L (ref 3.5–5.2)
Sodium: 143 mmol/L (ref 134–144)
Total Protein: 6.7 g/dL (ref 6.0–8.5)
eGFR: 61 mL/min/{1.73_m2} (ref >59)

## 2023-11-10 LAB — TSH: TSH: 1.64 u[IU]/mL (ref 0.450–4.500)

## 2023-11-11 LAB — URINE CULTURE

## 2023-11-13 ENCOUNTER — Other Ambulatory Visit: Payer: Self-pay | Admitting: Family Medicine

## 2023-11-13 ENCOUNTER — Ambulatory Visit: Payer: Self-pay | Admitting: Family Medicine

## 2023-11-13 DIAGNOSIS — H401111 Primary open-angle glaucoma, right eye, mild stage: Secondary | ICD-10-CM | POA: Diagnosis not present

## 2023-11-15 DIAGNOSIS — R32 Unspecified urinary incontinence: Secondary | ICD-10-CM | POA: Diagnosis not present

## 2023-11-15 DIAGNOSIS — F411 Generalized anxiety disorder: Secondary | ICD-10-CM | POA: Diagnosis not present

## 2023-11-15 DIAGNOSIS — F3341 Major depressive disorder, recurrent, in partial remission: Secondary | ICD-10-CM | POA: Diagnosis not present

## 2023-11-17 ENCOUNTER — Ambulatory Visit (INDEPENDENT_AMBULATORY_CARE_PROVIDER_SITE_OTHER)

## 2023-11-17 DIAGNOSIS — E538 Deficiency of other specified B group vitamins: Secondary | ICD-10-CM

## 2023-11-17 NOTE — Progress Notes (Signed)
 Patient is in office today for a nurse visit for B12 Injection. Patient Injection was given in the  Left deltoid. Patient tolerated injection well.

## 2023-11-22 ENCOUNTER — Ambulatory Visit: Payer: Self-pay | Admitting: Family Medicine

## 2023-11-29 DIAGNOSIS — Z930 Tracheostomy status: Secondary | ICD-10-CM | POA: Diagnosis not present

## 2023-11-29 DIAGNOSIS — R5381 Other malaise: Secondary | ICD-10-CM | POA: Diagnosis not present

## 2023-11-30 DIAGNOSIS — H04123 Dry eye syndrome of bilateral lacrimal glands: Secondary | ICD-10-CM | POA: Diagnosis not present

## 2023-12-19 ENCOUNTER — Ambulatory Visit (INDEPENDENT_AMBULATORY_CARE_PROVIDER_SITE_OTHER)

## 2023-12-19 DIAGNOSIS — E538 Deficiency of other specified B group vitamins: Secondary | ICD-10-CM

## 2023-12-19 DIAGNOSIS — Z930 Tracheostomy status: Secondary | ICD-10-CM | POA: Diagnosis not present

## 2023-12-19 NOTE — Progress Notes (Signed)
 Patient is in office today for a nurse visit for B12 Injection. Patient Injection was given in the  Right deltoid. Patient tolerated injection well.

## 2023-12-25 DIAGNOSIS — L570 Actinic keratosis: Secondary | ICD-10-CM | POA: Diagnosis not present

## 2023-12-25 DIAGNOSIS — Z85828 Personal history of other malignant neoplasm of skin: Secondary | ICD-10-CM | POA: Diagnosis not present

## 2023-12-25 DIAGNOSIS — Z08 Encounter for follow-up examination after completed treatment for malignant neoplasm: Secondary | ICD-10-CM | POA: Diagnosis not present

## 2023-12-25 DIAGNOSIS — X32XXXD Exposure to sunlight, subsequent encounter: Secondary | ICD-10-CM | POA: Diagnosis not present

## 2024-01-19 ENCOUNTER — Ambulatory Visit (INDEPENDENT_AMBULATORY_CARE_PROVIDER_SITE_OTHER)

## 2024-01-19 DIAGNOSIS — E538 Deficiency of other specified B group vitamins: Secondary | ICD-10-CM | POA: Diagnosis not present

## 2024-01-19 NOTE — Progress Notes (Signed)
 Patient is in office today for a nurse visit for B12 Injection. Patient Injection was given in the  Left deltoid. Patient tolerated injection well.

## 2024-02-15 ENCOUNTER — Other Ambulatory Visit: Payer: Self-pay | Admitting: Family Medicine

## 2024-02-20 ENCOUNTER — Ambulatory Visit (INDEPENDENT_AMBULATORY_CARE_PROVIDER_SITE_OTHER)

## 2024-02-20 DIAGNOSIS — E538 Deficiency of other specified B group vitamins: Secondary | ICD-10-CM | POA: Diagnosis not present

## 2024-02-20 NOTE — Progress Notes (Signed)
 Patient is in office today for a nurse visit for B12 Injection. Patient Injection was given in the  Right deltoid. Patient tolerated injection well.

## 2024-02-23 ENCOUNTER — Other Ambulatory Visit: Payer: Self-pay | Admitting: Family Medicine

## 2024-02-28 DIAGNOSIS — K080 Exfoliation of teeth due to systemic causes: Secondary | ICD-10-CM | POA: Diagnosis not present

## 2024-02-29 ENCOUNTER — Other Ambulatory Visit: Payer: Self-pay | Admitting: Family Medicine

## 2024-03-04 ENCOUNTER — Ambulatory Visit: Payer: Self-pay

## 2024-03-04 NOTE — Telephone Encounter (Signed)
 FYI Only or Action Required?: Action required by provider: clinical question for provider and update on patient condition.  Patient was last seen in primary care on 11/09/2023 by [name], Fonda LABOR, MD.  Called Nurse Triage reporting Constipation.  Symptoms began a week ago.  Interventions attempted: OTC medications: miralax .  Symptoms are: unchanged.  Triage Disposition: Home Care  Patient/caregiver understands and will follow disposition?: Yes  Patient isn't feeling well, she has been having diarrhea for about a week, now she can't use the restroom.   Reason for Disposition  MILD constipation  Answer Assessment - Initial Assessment Questions Patient's husband states that his wife (patient) has been off of Clonazepam  due to the pharmacy being out of her dosage (0.25mg )   Patient has a nervous stomach, and had diarrhea for about four days.   She has stopped having diarrhea, but has not been able to have a bowel movement.  She is using laxitives, but is also too nervous to eat.  Because of her dosage being on back order, patient and husband request that medication be ordered as Clonazepam  0.5 mg with instructions to cut in half.   Husband states that he did find a bottle of Clonazepam  0.5 and is cutting them in half for her now  Protocols used: Constipation-A-AH

## 2024-03-04 NOTE — Telephone Encounter (Addendum)
 Three attempts made; no answer. Second attempt; no answer  First attempt; no answer      Copied from [id number]. Topic: Clinical - Pink Word Triage >> Mar 04, 2024  8:24 AM Diannia H wrote: Reason for Triage: Patient isn't feeling well, she has been having diarrhea for about a week, now she can't use the restroom. >> Mar 04, 2024  8:24 AM Diannia H wrote: Patient isn't feeling well, she has been having diarrhea for about a week, now she can't use the restroom.

## 2024-03-04 NOTE — Telephone Encounter (Signed)
 Earlier today already sent to the change dose to 0.5, she can take a half of that, it should already be at the pharmacy

## 2024-03-04 NOTE — Telephone Encounter (Signed)
 Rx at pharmacy. Tried calling ot x2. Number is being screened and will not accept call.

## 2024-03-04 NOTE — Telephone Encounter (Signed)
 Duplicate

## 2024-03-07 ENCOUNTER — Other Ambulatory Visit: Payer: Self-pay | Admitting: Family Medicine

## 2024-03-07 DIAGNOSIS — I10 Essential (primary) hypertension: Secondary | ICD-10-CM

## 2024-03-11 ENCOUNTER — Encounter: Payer: Self-pay | Admitting: Family Medicine

## 2024-03-11 ENCOUNTER — Ambulatory Visit: Admitting: Family Medicine

## 2024-03-13 ENCOUNTER — Ambulatory Visit: Payer: Self-pay

## 2024-03-13 ENCOUNTER — Ambulatory Visit (INDEPENDENT_AMBULATORY_CARE_PROVIDER_SITE_OTHER): Admitting: Family Medicine

## 2024-03-13 ENCOUNTER — Encounter: Payer: Self-pay | Admitting: Family Medicine

## 2024-03-13 VITALS — BP 125/78 | HR 85 | Temp 97.1°F | Ht 65.0 in | Wt 165.0 lb

## 2024-03-13 DIAGNOSIS — R197 Diarrhea, unspecified: Secondary | ICD-10-CM

## 2024-03-13 NOTE — Telephone Encounter (Signed)
 Went to attempt 3rd call, can view in chart patient is being seen today for reported symptom.

## 2024-03-13 NOTE — Telephone Encounter (Signed)
   This RN made 1st attempt to reach patient. Left message with callback number.            Message from Leith-Hatfield L sent at 03/13/2024  8:07 AM EDT  Summary: diarrhea   Patient has had diarrhea for about a week now, unable to control bowel movements. Lack of appetite.  Requesting appointment, [phone]

## 2024-03-13 NOTE — Progress Notes (Signed)
 BP 125/78   Pulse 85   Temp (!) 97.1 F (36.2 C)   Ht 5' 5 (1.651 m)   Wt 165 lb (74.8 kg)   SpO2 98%   BMI 27.46 kg/m    Subjective:   Patient ID: [name], female    DOB: Apr 27, 1941, 83 y.o.   MRN: [id number]  HPI: [name] is a 83 y.o. female presenting on 03/13/2024 for Diarrhea (Soft at times. Present for weeks on/off)   Discussed the use of AI scribe software for clinical note transcription with the patient, who gave verbal consent to proceed.  History of Present Illness   [name] is an 83 year old female who presents with diarrhea for one week.  She has been experiencing diarrhea for approximately one week, with stool consistency varying between soft, watery, and sometimes in between. There was a single occurrence of blood in the stool, which she attributes to hemorrhoids. No associated stomach pain, nausea, or vomiting. She feels 'hot' but has not taken her temperature at home, and denies any fever or chills. Her appetite has decreased slightly during this period.  She reports staying hydrated but acknowledges a decrease in urine output, which she attributes to her current condition. She experiences dry mouth.          Relevant past medical, surgical, family and social history reviewed and updated as indicated. Interim medical history since our last visit reviewed. Allergies and medications reviewed and updated.  Review of Systems  Constitutional:  Positive for appetite change. Negative for chills.  Eyes:  Negative for redness and visual disturbance.  Respiratory:  Negative for chest tightness and shortness of breath.   Cardiovascular:  Negative for chest pain and leg swelling.  Gastrointestinal:  Positive for diarrhea. Negative for abdominal pain, blood in stool, constipation, nausea and vomiting.  Genitourinary:  Negative for difficulty urinating, dysuria and frequency.  Musculoskeletal:  Negative for back pain and gait problem.  Skin:   Negative for rash.  Neurological:  Positive for tremors. Negative for light-headedness and headaches.  Psychiatric/Behavioral:  Negative for agitation and behavioral problems.   All other systems reviewed and are negative.   Per HPI unless specifically indicated above   Allergies as of 03/13/2024       Reactions   Lipitor [atorvastatin  Calcium ] Other (See Comments)   myalgias   Lyrica [pregabalin] Other (See Comments)   SORES IN MOUTH    Motrin [ibuprofen] Other (See Comments)   Pt denies   Sibutramine Other (See Comments)   UNSPECIFIED REACTION  UNSPECIFIED REACTION  Reaction unknown   Sibutramine Hcl Monohydrate Other (See Comments)   UNSPECIFIED REACTION    Alendronate Sodium Nausea Only   Latex Rash, Other (See Comments)   RA to latex 1982; includes bandaids    Levofloxacin Nausea And Vomiting   Meloxicam Other (See Comments)   Vaginal itching         Medication List        Accurate as of March 13, 2024 10:47 AM. If you have any questions, ask your nurse or doctor.          Anucort-HC  25 MG suppository Generic drug: hydrocortisone  INSERT 1 SUPPOSITORY RECTALLY TWICE DAILY   aspirin  81 MG chewable tablet Chew 81 mg by mouth at bedtime.   calcium  carbonate 1250 (500 Ca) MG tablet Commonly known as: OS-CAL - dosed in mg of elemental calcium  Take 1 tablet (500 mg of elemental calcium  total) by mouth daily with  breakfast.   clonazePAM  0.25 MG disintegrating tablet Commonly known as: KLONOPIN  Take 1 tablet (0.25 mg total) by mouth 3 (three) times daily as needed for seizure. Please cancel out any other prescriptions on file   clonazePAM  0.5 MG tablet Commonly known as: KLONOPIN  TAKE 1/2 TABLET 3 TIMES A DAY AS NEEDED FOR ANXIETY   docusate sodium  250 MG capsule Commonly known as: COLACE Take 250 mg by mouth daily.   fluconazole  150 MG tablet Commonly known as: Diflucan  Take 1 tablet (150 mg total) by mouth once a week.   furosemide  20 MG  tablet Commonly known as: LASIX  Take 20 mg by mouth. Take 1/2 tablet by mouth on Mon. Wed and Fri   furosemide  40 MG tablet Commonly known as: LASIX  TAKE 1/2 TABLET ON MONDAYS, WEDNESDAYS, & FRIDAYS, AND 1 TABLET ALL OTHER DAYS   guaiFENesin  100 MG/5ML liquid Commonly known as: ROBITUSSIN Take 5 mLs by mouth every 4 (four) hours as needed for cough or to loosen phlegm.   hydrocortisone  2.5 % rectal cream Commonly known as: ANUSOL -HC APPLY RETALLY 2 TIMES A DAY AS NEEDED   ketoconazole  2 % cream Commonly known as: NIZORAL  Apply topically daily.   lactulose  10 GM/15ML solution Commonly known as: Constulose  Take 30 mLs (20 g total) by mouth daily as needed.   latanoprost  0.005 % ophthalmic solution Commonly known as: XALATAN  Place 1 drop into both eyes at bedtime.   levothyroxine  100 MCG tablet Commonly known as: SYNTHROID  Take 88 mcg by mouth daily.   linaclotide  72 MCG capsule Commonly known as: Linzess  Take 1 capsule (72 mcg total) by mouth daily before breakfast.   loratadine  10 MG tablet Commonly known as: CLARITIN  TAKE ONE TABLET DAILY   magnesium  oxide 400 (241.3 Mg) MG tablet Commonly known as: MAG-OX Take 1 tablet (400 mg total) by mouth daily.   multivitamin with minerals Tabs tablet Take 1 tablet by mouth daily.   nystatin  powder Generic drug: nystatin  APPLY TO THE AFFECTED AREA(S) THREE TIMES DAILY   potassium chloride  10 MEQ tablet Commonly known as: KLOR-CON  TAKE TWO TABLETS TWICE DAILY   QUEtiapine  50 MG tablet Commonly known as: SEROQUEL  Take 1 tablet (50 mg total) by mouth at bedtime.   simvastatin  80 MG tablet Commonly known as: ZOCOR  Take 1 tablet (80 mg total) by mouth daily.   Travoprost  (BAK Free) 0.004 % Soln ophthalmic solution Commonly known as: TRAVATAN  1 drop at bedtime.   triamcinolone  cream 0.1 % Commonly known as: KENALOG  Apply topically 2 (two) times daily as needed.   venlafaxine  75 MG tablet Commonly known as:  EFFEXOR  Take 1 tablet (75 mg total) by mouth daily.   Vitamin D3 25 MCG (1000 UT) Caps 2 capsules         Objective:   BP 125/78   Pulse 85   Temp (!) 97.1 F (36.2 C)   Ht 5' 5 (1.651 m)   Wt 165 lb (74.8 kg)   SpO2 98%   BMI 27.46 kg/m   Wt Readings from Last 3 Encounters:  03/13/24 165 lb (74.8 kg)  11/09/23 178 lb (80.7 kg)  10/06/23 176 lb (79.8 kg)    Physical Exam Vitals and nursing note reviewed.  Constitutional:      General: She is not in acute distress.    Appearance: She is well-developed. She is not diaphoretic.  Eyes:     Conjunctiva/sclera: Conjunctivae normal.  Abdominal:     General: Abdomen is flat. Bowel sounds are normal. There  is no distension.     Palpations: Abdomen is soft.     Tenderness: There is no abdominal tenderness. There is no right CVA tenderness, left CVA tenderness, guarding or rebound.  Musculoskeletal:        General: No swelling.  Skin:    General: Skin is warm and dry.     Findings: No rash.  Neurological:     Mental Status: She is alert and oriented to person, place, and time.     Coordination: Coordination normal.  Psychiatric:        Behavior: Behavior normal.                 Assessment & Plan:   Problem List Items Addressed This Visit   None Visit Diagnoses       Diarrhea of presumed infectious origin    -  Primary   Relevant Orders   Cdiff NAA+O+P+Stool Culture          Acute diarrhea Acute diarrhea for one week, no abdominal pain, nausea, or vomiting. Occasional blood likely from hemorrhoids. Consider infectious causes including C. difficile. - Order stool study for infectious causes including C. difficile. - Advise stool sample collection and lab return.  Dehydration Mild dehydration due to acute diarrhea, with decreased urine output and dry mouth. Emphasized hydration to prevent hospitalization. - Advise increased fluid intake, including water  and electrolyte solutions. - Monitor urine color  for light yellow or mostly clear.          Follow up plan: Return if symptoms worsen or fail to improve.  Counseling provided for all of the vaccine components Orders Placed This Encounter  Procedures   Cdiff NAA+O+P+Stool Culture    Fonda Levins, MD [facility] 03/13/2024, 10:47 AM

## 2024-03-13 NOTE — Telephone Encounter (Signed)
 This RN made 2nd attempt to reach patient. Left voicemail with callback number

## 2024-03-13 NOTE — Telephone Encounter (Signed)
 Pt seen by Dr. Maryanne today

## 2024-03-15 ENCOUNTER — Other Ambulatory Visit

## 2024-03-15 DIAGNOSIS — R197 Diarrhea, unspecified: Secondary | ICD-10-CM | POA: Diagnosis not present

## 2024-03-18 ENCOUNTER — Telehealth: Payer: Self-pay | Admitting: Family Medicine

## 2024-03-18 ENCOUNTER — Other Ambulatory Visit

## 2024-03-18 DIAGNOSIS — Z0279 Encounter for issue of other medical certificate: Secondary | ICD-10-CM

## 2024-03-18 NOTE — Telephone Encounter (Signed)
 [name] dropped off handicap forms to be completed and signed.  Form Fee Paid? (Y/N)       y     If NO, form is placed on front office manager desk to hold until payment received. If YES, then form will be placed in the RX/HH Nurse Coordinators box for completion.  Form will not be processed until payment is received

## 2024-03-19 LAB — CDIFF NAA+O+P+STOOL CULTURE
Toxigenic C. Difficile by PCR: NEGATIVE
Toxigenic C. Difficile by PCR: NEGATIVE

## 2024-03-20 ENCOUNTER — Ambulatory Visit: Payer: Self-pay | Admitting: Family Medicine

## 2024-03-20 DIAGNOSIS — Z930 Tracheostomy status: Secondary | ICD-10-CM | POA: Diagnosis not present

## 2024-03-20 DIAGNOSIS — R269 Unspecified abnormalities of gait and mobility: Secondary | ICD-10-CM | POA: Diagnosis not present

## 2024-03-20 DIAGNOSIS — R5381 Other malaise: Secondary | ICD-10-CM | POA: Diagnosis not present

## 2024-03-21 ENCOUNTER — Ambulatory Visit (INDEPENDENT_AMBULATORY_CARE_PROVIDER_SITE_OTHER): Admitting: Family Medicine

## 2024-03-21 ENCOUNTER — Encounter: Payer: Self-pay | Admitting: Family Medicine

## 2024-03-21 VITALS — BP 135/76 | HR 83 | Ht 65.0 in | Wt 167.0 lb

## 2024-03-21 DIAGNOSIS — E890 Postprocedural hypothyroidism: Secondary | ICD-10-CM | POA: Diagnosis not present

## 2024-03-21 DIAGNOSIS — E538 Deficiency of other specified B group vitamins: Secondary | ICD-10-CM

## 2024-03-21 DIAGNOSIS — F339 Major depressive disorder, recurrent, unspecified: Secondary | ICD-10-CM

## 2024-03-21 DIAGNOSIS — Z23 Encounter for immunization: Secondary | ICD-10-CM

## 2024-03-21 DIAGNOSIS — I480 Paroxysmal atrial fibrillation: Secondary | ICD-10-CM

## 2024-03-21 DIAGNOSIS — Z85850 Personal history of malignant neoplasm of thyroid: Secondary | ICD-10-CM

## 2024-03-21 DIAGNOSIS — F411 Generalized anxiety disorder: Secondary | ICD-10-CM

## 2024-03-21 DIAGNOSIS — I10 Essential (primary) hypertension: Secondary | ICD-10-CM

## 2024-03-21 DIAGNOSIS — I5032 Chronic diastolic (congestive) heart failure: Secondary | ICD-10-CM

## 2024-03-21 DIAGNOSIS — E7800 Pure hypercholesterolemia, unspecified: Secondary | ICD-10-CM

## 2024-03-21 MED ORDER — CLONAZEPAM 0.25 MG PO TBDP: 0.2500 mg | ORAL_TABLET | Freq: Three times a day (TID) | ORAL | 3 refills | Status: AC | PRN

## 2024-03-21 NOTE — Progress Notes (Signed)
 BP 135/76   Pulse 83   Ht 5' 5 (1.651 m)   Wt 167 lb (75.8 kg)   SpO2 95%   BMI 27.79 kg/m    Subjective:   Patient ID: [name], female    DOB: [birth year], 83 y.o.   MRN: [id number]  HPI: [name] is a 83 y.o. female presenting on 03/21/2024 for Medical Management of Chronic Issues, Hypertension, and Depression   Discussed the use of AI scribe software for clinical note transcription with the patient, who gave verbal consent to proceed.  History of Present Illness   [name] is an 83 year old female with anxiety, hypertension, and hypothyroidism who presents for a recheck of her chronic medical issues.  She is here for a recheck of her anxiety, which is managed with clonazepam  0.25 mg TID PRN. Her chronic tracheostomy and related health issues are significant triggers for her anxiety. She also uses quetiapine  in the evenings to help with sleep, though she reports mixed results, stating 'sometimes she sleeps good, sometimes she don't.'  Her chronic medical issues include hypertension, hypothyroidism, hypercholesterolemia, paroxysmal atrial fibrillation, and diastolic congestive heart failure. She has a history of thyroid  cancer and underwent a thyroidectomy, resulting in bilateral vocal cord paralysis and necessitating a tracheostomy. Her breathing is affected by the tracheostomy, describing it as 'breathing through a straw,' which makes it difficult. She takes furosemide  for fluid management, alternating between a half dose and a full dose on different days.  No new swelling is reported, and she states that her neck feels good. She mentions recent diarrhea, which she attributes to dietary changes, including consuming fruits, apple juice, and oil. She uses Pepto Bismol and Imodium AD to manage her symptoms.      Relevant past medical, surgical, family and social history reviewed and updated as indicated. Interim medical history since our last visit reviewed. Allergies  and medications reviewed and updated.  Review of Systems  Constitutional:  Negative for chills and fever.  HENT:  Negative for congestion, ear discharge and ear pain.   Eyes:  Negative for redness and visual disturbance.  Respiratory:  Positive for wheezing. Negative for chest tightness and shortness of breath.   Cardiovascular:  Negative for chest pain and leg swelling.  Genitourinary:  Negative for difficulty urinating and dysuria.  Musculoskeletal:  Negative for back pain and gait problem.  Skin:  Negative for rash.  Neurological:  Negative for light-headedness and headaches.  Psychiatric/Behavioral:  Negative for agitation and behavioral problems.   All other systems reviewed and are negative.   Per HPI unless specifically indicated above   Allergies as of 03/21/2024       Reactions   Lipitor [atorvastatin  Calcium ] Other (See Comments)   myalgias   Lyrica [pregabalin] Other (See Comments)   SORES IN MOUTH    Motrin [ibuprofen] Other (See Comments)   Pt denies   Sibutramine Other (See Comments)   UNSPECIFIED REACTION  UNSPECIFIED REACTION  Reaction unknown   Sibutramine Hcl Monohydrate Other (See Comments)   UNSPECIFIED REACTION    Alendronate Sodium Nausea Only   Latex Rash, Other (See Comments)   RA to latex 1982; includes bandaids    Levofloxacin Nausea And Vomiting   Meloxicam Other (See Comments)   Vaginal itching         Medication List        Accurate as of March 21, 2024  3:31 PM. If you have any questions, ask your nurse or  doctor.          Anucort-HC  25 MG suppository Generic drug: hydrocortisone  INSERT 1 SUPPOSITORY RECTALLY TWICE DAILY   aspirin  81 MG chewable tablet Chew 81 mg by mouth at bedtime.   calcium  carbonate 1250 (500 Ca) MG tablet Commonly known as: OS-CAL - dosed in mg of elemental calcium  Take 1 tablet (500 mg of elemental calcium  total) by mouth daily with breakfast.   clonazePAM  0.25 MG disintegrating tablet Commonly  known as: KLONOPIN  Take 1 tablet (0.25 mg total) by mouth 3 (three) times daily as needed for seizure. Please cancel out any other prescriptions on file What changed: Another medication with the same name was removed. Continue taking this medication, and follow the directions you see here. Changed by: [name]   docusate sodium  250 MG capsule Commonly known as: COLACE Take 250 mg by mouth daily.   fluconazole  150 MG tablet Commonly known as: Diflucan  Take 1 tablet (150 mg total) by mouth once a week.   furosemide  20 MG tablet Commonly known as: LASIX  Take 20 mg by mouth. Take 1/2 tablet by mouth on Mon. Wed and Fri   furosemide  40 MG tablet Commonly known as: LASIX  TAKE 1/2 TABLET ON MONDAYS, WEDNESDAYS, & FRIDAYS, AND 1 TABLET ALL OTHER DAYS   guaiFENesin  100 MG/5ML liquid Commonly known as: ROBITUSSIN Take 5 mLs by mouth every 4 (four) hours as needed for cough or to loosen phlegm.   hydrocortisone  2.5 % rectal cream Commonly known as: ANUSOL -HC APPLY RETALLY 2 TIMES A DAY AS NEEDED   ketoconazole  2 % cream Commonly known as: NIZORAL  Apply topically daily.   lactulose  10 GM/15ML solution Commonly known as: Constulose  Take 30 mLs (20 g total) by mouth daily as needed.   latanoprost  0.005 % ophthalmic solution Commonly known as: XALATAN  Place 1 drop into both eyes at bedtime.   levothyroxine  100 MCG tablet Commonly known as: SYNTHROID  Take 88 mcg by mouth daily.   linaclotide  72 MCG capsule Commonly known as: Linzess  Take 1 capsule (72 mcg total) by mouth daily before breakfast.   loratadine  10 MG tablet Commonly known as: CLARITIN  TAKE ONE TABLET DAILY   magnesium  oxide 400 (241.3 Mg) MG tablet Commonly known as: MAG-OX Take 1 tablet (400 mg total) by mouth daily.   multivitamin with minerals Tabs tablet Take 1 tablet by mouth daily.   nystatin  powder Generic drug: nystatin  APPLY TO THE AFFECTED AREA(S) THREE TIMES DAILY   potassium chloride   10 MEQ tablet Commonly known as: KLOR-CON  TAKE TWO TABLETS TWICE DAILY   QUEtiapine  50 MG tablet Commonly known as: SEROQUEL  Take 1 tablet (50 mg total) by mouth at bedtime.   simvastatin  80 MG tablet Commonly known as: ZOCOR  Take 1 tablet (80 mg total) by mouth daily.   Travoprost  (BAK Free) 0.004 % Soln ophthalmic solution Commonly known as: TRAVATAN  1 drop at bedtime.   triamcinolone  cream 0.1 % Commonly known as: KENALOG  Apply topically 2 (two) times daily as needed.   venlafaxine  75 MG tablet Commonly known as: EFFEXOR  Take 1 tablet (75 mg total) by mouth daily.   Vitamin D3 25 MCG (1000 UT) Caps 2 capsules         Objective:   BP 135/76   Pulse 83   Ht 5' 5 (1.651 m)   Wt 167 lb (75.8 kg)   SpO2 95%   BMI 27.79 kg/m   Wt Readings from Last 3 Encounters:  03/21/24 167 lb (75.8 kg)  03/13/24 165 lb (  74.8 kg)  11/09/23 178 lb (80.7 kg)    Physical Exam Physical Exam   VITALS: BP- 135/76 NECK: Neck supple with minimal swelling. CHEST: Lungs clear to auscultation. CARDIOVASCULAR: Regular heart sounds with a systolic murmur present.         Assessment & Plan:   Problem List Items Addressed This Visit       Cardiovascular and Mediastinum   Essential hypertension - Primary   Relevant Orders   CMP14+EGFR   Lipid panel   PAF (paroxysmal atrial fibrillation) (HCC)   Relevant Orders   Lipid panel   Diastolic CHF (HCC)     Endocrine   Hypothyroidism   Relevant Orders   Thyroid  Panel With TSH     Other   Depression, recurrent (HCC)   Relevant Medications   clonazePAM  (KLONOPIN ) 0.25 MG disintegrating tablet   GAD (generalized anxiety disorder)   Relevant Medications   clonazePAM  (KLONOPIN ) 0.25 MG disintegrating tablet   Pure hypercholesterolemia   Relevant Orders   Lipid panel   History of papillary adenocarcinoma of thyroid    Other Visit Diagnoses       Encounter for immunization       Relevant Orders   Flu vaccine HIGH DOSE  PF(Fluzone Trivalent) (Completed)          Chronic tracheostomy due to bilateral vocal cord paralysis Chronic tracheostomy secondary to bilateral vocal cord paralysis post-thyroidectomy.  Generalized anxiety disorder Generalized anxiety disorder managed with clonazepam  0.25 mg TID PRN. Anxiety exacerbated by health issues related to chronic tracheostomy. Clonazepam  refill issues due to stock availability. Quetiapine  used in the evenings to aid sleep, with variable effectiveness. - Send clonazepam  refill. - Monitor stock availability of clonazepam . - Continue quetiapine  in the evenings. - Consider increasing quetiapine  dose if sleep issues persist and no side effects.  Diarrhea Recent diarrhea possibly related to dietary changes, including high fiber intake from fruits and apple juice. Negative for C. diff infection. - Monitor for diarrhea recurrence and adjust diet. - Consider use of Pepto Bismol or Imodium AD if needed.  Diastolic congestive heart failure Diastolic congestive heart failure managed with furosemide . She takes furosemide  variably: half dose some days, full dose on others. No new swelling reported.  Hypertension Hypertension well-controlled with current medication regimen. Blood pressure today is 135/76 mmHg.  Hypothyroidism with thyroid  cancer Hypothyroidism managed with thyroid  medication. Current thyroid  medication appears effective. - Order blood work to check thyroid  levels.  Hypercholesterolemia Hypercholesterolemia is a chronic condition.  Paroxysmal atrial fibrillation Paroxysmal atrial fibrillation managed by cardiologist.          Follow up plan: Return in about 4 months (around 07/21/2024), or if symptoms worsen or fail to improve, for Thyroid  and anxiety recheck.  Counseling provided for all of the vaccine components Orders Placed This Encounter  Procedures   Flu vaccine HIGH DOSE PF(Fluzone Trivalent)   CMP14+EGFR   Lipid panel   Thyroid   Panel With TSH    Terri Levins, MD [facility] 03/21/2024, 3:31 PM

## 2024-03-21 NOTE — Telephone Encounter (Signed)
 Smart blocker said not accepting calls from this number - handicap form ready and placed at the front desk

## 2024-03-22 ENCOUNTER — Ambulatory Visit

## 2024-03-22 LAB — CMP14+EGFR
ALT: 16 IU/L (ref 0–32)
AST: 31 IU/L (ref 0–40)
Albumin: 4.6 g/dL (ref 3.7–4.7)
Alkaline Phosphatase: 112 IU/L (ref 48–129)
BUN/Creatinine Ratio: 11 — ABNORMAL LOW (ref 12–28)
BUN: 9 mg/dL (ref 8–27)
Bilirubin Total: 0.7 mg/dL (ref 0.0–1.2)
CO2: 25 mmol/L (ref 20–29)
Calcium: 9.5 mg/dL (ref 8.7–10.3)
Chloride: 103 mmol/L (ref 96–106)
Creatinine, Ser: 0.82 mg/dL (ref 0.57–1.00)
Globulin, Total: 2.5 g/dL (ref 1.5–4.5)
Glucose: 107 mg/dL — ABNORMAL HIGH (ref 70–99)
Potassium: 4.3 mmol/L (ref 3.5–5.2)
Sodium: 145 mmol/L — ABNORMAL HIGH (ref 134–144)
Total Protein: 7.1 g/dL (ref 6.0–8.5)
eGFR: 72 mL/min/1.73 (ref >59)

## 2024-03-22 LAB — LIPID PANEL
Chol/HDL Ratio: 2.4 ratio (ref 0.0–4.4)
Cholesterol, Total: 167 mg/dL (ref 100–199)
HDL: 71 mg/dL (ref >39)
LDL Chol Calc (NIH): 80 mg/dL (ref 0–99)
Triglycerides: 86 mg/dL (ref 0–149)
VLDL Cholesterol Cal: 16 mg/dL (ref 5–40)

## 2024-03-22 LAB — THYROID PANEL WITH TSH
Free Thyroxine Index: 3.3 (ref 1.2–4.9)
T3 Uptake Ratio: 31 % (ref 24–39)
T4, Total: 10.6 ug/dL (ref 4.5–12.0)
TSH: 1.62 u[IU]/mL (ref 0.450–4.500)

## 2024-03-25 DIAGNOSIS — Z930 Tracheostomy status: Secondary | ICD-10-CM | POA: Diagnosis not present

## 2024-03-25 DIAGNOSIS — J386 Stenosis of larynx: Secondary | ICD-10-CM | POA: Diagnosis not present

## 2024-03-28 ENCOUNTER — Ambulatory Visit: Payer: Self-pay | Admitting: Family Medicine

## 2024-04-05 ENCOUNTER — Other Ambulatory Visit: Payer: Self-pay | Admitting: Family Medicine

## 2024-04-08 ENCOUNTER — Other Ambulatory Visit: Payer: Self-pay | Admitting: Family Medicine

## 2024-04-08 DIAGNOSIS — I10 Essential (primary) hypertension: Secondary | ICD-10-CM

## 2024-04-08 NOTE — Telephone Encounter (Signed)
 Patient had appt 9-25 and refills were sent to the pharmacy

## 2024-04-08 NOTE — Telephone Encounter (Signed)
 Controlled needs OV for refills

## 2024-04-09 NOTE — Telephone Encounter (Signed)
 Pt husband in office today regarding her Clonazepam , [facility] pharmacy does not have the 0.25 mg it is on back order again, they only have the 0.5 mg in the disintegrating tablet and she can break it in half. Please send in a new script for the 0.5 mg tabs 1/2 tab TID prn for seizure.

## 2024-04-10 MED ORDER — CLONAZEPAM 0.5 MG PO TBDP: ORAL_TABLET | ORAL | 0 refills | Status: DC

## 2024-04-10 NOTE — Telephone Encounter (Signed)
Husband made aware

## 2024-04-10 NOTE — Telephone Encounter (Signed)
 Sent to the 0.5 mg tablets for her.  The others were out of stock.

## 2024-04-10 NOTE — Addendum Note (Signed)
 Addended by: MARYANNE CHEW on: 04/10/2024 11:31 AM   Modules accepted: Orders

## 2024-04-19 DIAGNOSIS — R269 Unspecified abnormalities of gait and mobility: Secondary | ICD-10-CM | POA: Diagnosis not present

## 2024-04-19 DIAGNOSIS — R5381 Other malaise: Secondary | ICD-10-CM | POA: Diagnosis not present

## 2024-04-19 DIAGNOSIS — Z930 Tracheostomy status: Secondary | ICD-10-CM | POA: Diagnosis not present

## 2024-04-22 ENCOUNTER — Ambulatory Visit (INDEPENDENT_AMBULATORY_CARE_PROVIDER_SITE_OTHER)

## 2024-04-22 DIAGNOSIS — E538 Deficiency of other specified B group vitamins: Secondary | ICD-10-CM

## 2024-04-22 NOTE — Progress Notes (Signed)
 Patient is in office today for a nurse visit for B12 Injection. Patient Injection was given in the  Left deltoid. Patient tolerated injection well.

## 2024-04-29 ENCOUNTER — Other Ambulatory Visit: Payer: Self-pay | Admitting: *Deleted

## 2024-05-07 DIAGNOSIS — E890 Postprocedural hypothyroidism: Secondary | ICD-10-CM | POA: Diagnosis not present

## 2024-05-07 DIAGNOSIS — Z85850 Personal history of malignant neoplasm of thyroid: Secondary | ICD-10-CM | POA: Diagnosis not present

## 2024-05-10 ENCOUNTER — Other Ambulatory Visit: Payer: Self-pay | Admitting: Family Medicine

## 2024-05-10 NOTE — Telephone Encounter (Signed)
 [facility] sent request for RF on clonazePAM  (KLONOPIN ) 0.5 MG disintegrating tablet they must still not be able to get the 0.25 mg.  Can this strength be refilled?  Last OV 03/21/24 on this day the 0.25 mg was written w/ 2 refills but this had to be changed last month to the 0.5 mg. Next OV is 07/24/24

## 2024-05-17 ENCOUNTER — Telehealth: Payer: Self-pay | Admitting: Family Medicine

## 2024-05-17 DIAGNOSIS — Z0279 Encounter for issue of other medical certificate: Secondary | ICD-10-CM

## 2024-05-17 NOTE — Telephone Encounter (Signed)
 Husband dropped off handicap forms to be completed and signed.  Form Fee Paid? (Y/N)      yes      If NO, form is placed on front office manager desk to hold until payment received. If YES, then form will be placed in the RX/HH Nurse Coordinators box for completion.  Form will not be processed until payment is received

## 2024-05-27 DIAGNOSIS — H401111 Primary open-angle glaucoma, right eye, mild stage: Secondary | ICD-10-CM | POA: Diagnosis not present

## 2024-05-28 ENCOUNTER — Ambulatory Visit: Payer: Self-pay | Admitting: *Deleted

## 2024-05-28 DIAGNOSIS — E538 Deficiency of other specified B group vitamins: Secondary | ICD-10-CM

## 2024-05-28 NOTE — Progress Notes (Signed)
 Patient is in office today for a nurse visit for B12 Injection. Patient Injection was given in the  Right deltoid. Patient tolerated injection well.

## 2024-06-14 ENCOUNTER — Other Ambulatory Visit: Payer: Self-pay | Admitting: Family Medicine

## 2024-06-14 NOTE — Telephone Encounter (Signed)
 Can we call the pharmacy and verified that they are sending the request because they are not able to get the other 1, just double check that and make sure that this was not automatically sent to us  because I do not want her to accidentally get both.

## 2024-06-14 NOTE — Telephone Encounter (Signed)
 [facility] sent request for RF on clonazePAM  (KLONOPIN ) 0.5 MG disintegrating tablet they must still not be able to get the 0.25 mg.  Can this strength be refilled?  Last OV 03/21/24 on this day the 0.25 mg was written w/ 2 refills but this had to be changed to the 0.5 mg. Next OV is 07/24/24

## 2024-06-14 NOTE — Telephone Encounter (Signed)
 Per Rosaline with [facility] they do have the 0.25mg  Klonopin  ODT in stock. Instructed to not fill .5mg  since the 0.25 is in stock and pt can get her refills. Rosaline will hold the .5mg  on file just in case it is needed again when the 0.25mg  is not in stock.  Dr. Maryanne made aware.

## 2024-06-28 ENCOUNTER — Ambulatory Visit: Admitting: *Deleted

## 2024-06-28 DIAGNOSIS — E538 Deficiency of other specified B group vitamins: Secondary | ICD-10-CM

## 2024-06-28 NOTE — Progress Notes (Signed)
 Patient is in office today for a nurse visit for B12 Injection. Patient Injection was given in the  Left deltoid. Patient tolerated injection well.

## 2024-07-24 ENCOUNTER — Ambulatory Visit: Payer: Self-pay | Admitting: Family Medicine

## 2024-07-30 ENCOUNTER — Ambulatory Visit

## 2024-08-09 ENCOUNTER — Ambulatory Visit: Admitting: Family Medicine

## 2024-09-11 ENCOUNTER — Ambulatory Visit: Admitting: Cardiology

## 2024-10-11 ENCOUNTER — Ambulatory Visit: Payer: Self-pay
